# Patient Record
Sex: Male | Born: 1955 | Race: White | Hispanic: No | Smoking: Former smoker
Health system: Southern US, Community
[De-identification: ages and names within clinical notes are randomized; demographics above are authoritative.]

## PROBLEM LIST (undated history)

## (undated) DIAGNOSIS — J449 Chronic obstructive pulmonary disease, unspecified: Secondary | ICD-10-CM

## (undated) DIAGNOSIS — N189 Chronic kidney disease, unspecified: Secondary | ICD-10-CM

## (undated) DIAGNOSIS — I70221 Atherosclerosis of native arteries of extremities with rest pain, right leg: Secondary | ICD-10-CM

## (undated) DIAGNOSIS — E785 Hyperlipidemia, unspecified: Secondary | ICD-10-CM

## (undated) DIAGNOSIS — D649 Anemia, unspecified: Secondary | ICD-10-CM

## (undated) DIAGNOSIS — N186 End stage renal disease: Secondary | ICD-10-CM

## (undated) DIAGNOSIS — U071 COVID-19: Secondary | ICD-10-CM

## (undated) DIAGNOSIS — T4145XA Adverse effect of unspecified anesthetic, initial encounter: Secondary | ICD-10-CM

## (undated) DIAGNOSIS — I639 Cerebral infarction, unspecified: Secondary | ICD-10-CM

## (undated) DIAGNOSIS — K219 Gastro-esophageal reflux disease without esophagitis: Secondary | ICD-10-CM

## (undated) DIAGNOSIS — Q211 Atrial septal defect: Secondary | ICD-10-CM

## (undated) DIAGNOSIS — I253 Aneurysm of heart: Secondary | ICD-10-CM

## (undated) DIAGNOSIS — J45909 Unspecified asthma, uncomplicated: Secondary | ICD-10-CM

## (undated) DIAGNOSIS — J189 Pneumonia, unspecified organism: Secondary | ICD-10-CM

## (undated) DIAGNOSIS — Z992 Dependence on renal dialysis: Secondary | ICD-10-CM

## (undated) DIAGNOSIS — K922 Gastrointestinal hemorrhage, unspecified: Secondary | ICD-10-CM

## (undated) DIAGNOSIS — Q2112 Patent foramen ovale: Secondary | ICD-10-CM

## (undated) DIAGNOSIS — T8859XA Other complications of anesthesia, initial encounter: Secondary | ICD-10-CM

## (undated) DIAGNOSIS — I10 Essential (primary) hypertension: Secondary | ICD-10-CM

## (undated) DIAGNOSIS — I739 Peripheral vascular disease, unspecified: Secondary | ICD-10-CM

## (undated) DIAGNOSIS — I509 Heart failure, unspecified: Secondary | ICD-10-CM

## (undated) DIAGNOSIS — Z720 Tobacco use: Secondary | ICD-10-CM

## (undated) HISTORY — PX: FINGER SURGERY: SHX640

## (undated) HISTORY — PX: COLONOSCOPY: SHX174

---
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
## 2000-03-29 ENCOUNTER — Emergency Department (HOSPITAL_COMMUNITY): Admission: EM | Admit: 2000-03-29 | Discharge: 2000-03-29 | Payer: Self-pay | Admitting: Emergency Medicine

## 2000-08-20 ENCOUNTER — Ambulatory Visit (HOSPITAL_BASED_OUTPATIENT_CLINIC_OR_DEPARTMENT_OTHER): Admission: RE | Admit: 2000-08-20 | Discharge: 2000-08-20 | Payer: Self-pay | Admitting: Orthopedic Surgery

## 2000-08-20 ENCOUNTER — Encounter: Payer: Self-pay | Admitting: Emergency Medicine

## 2000-09-21 ENCOUNTER — Ambulatory Visit (HOSPITAL_BASED_OUTPATIENT_CLINIC_OR_DEPARTMENT_OTHER): Admission: RE | Admit: 2000-09-21 | Discharge: 2000-09-21 | Payer: Self-pay | Admitting: Orthopedic Surgery

## 2002-01-30 ENCOUNTER — Emergency Department (HOSPITAL_COMMUNITY): Admission: EM | Admit: 2002-01-30 | Discharge: 2002-01-30 | Payer: Self-pay | Admitting: Emergency Medicine

## 2003-07-16 ENCOUNTER — Emergency Department (HOSPITAL_COMMUNITY): Admission: EM | Admit: 2003-07-16 | Discharge: 2003-07-16 | Payer: Self-pay | Admitting: Emergency Medicine

## 2003-08-11 ENCOUNTER — Emergency Department (HOSPITAL_COMMUNITY): Admission: EM | Admit: 2003-08-11 | Discharge: 2003-08-11 | Payer: Self-pay | Admitting: Emergency Medicine

## 2003-08-14 ENCOUNTER — Emergency Department (HOSPITAL_COMMUNITY): Admission: EM | Admit: 2003-08-14 | Discharge: 2003-08-14 | Payer: Self-pay | Admitting: Emergency Medicine

## 2004-07-24 ENCOUNTER — Emergency Department (HOSPITAL_COMMUNITY): Admission: EM | Admit: 2004-07-24 | Discharge: 2004-07-24 | Payer: Self-pay | Admitting: Emergency Medicine

## 2004-08-22 ENCOUNTER — Encounter (INDEPENDENT_AMBULATORY_CARE_PROVIDER_SITE_OTHER): Payer: Self-pay | Admitting: *Deleted

## 2004-08-22 ENCOUNTER — Ambulatory Visit (HOSPITAL_COMMUNITY): Admission: RE | Admit: 2004-08-22 | Discharge: 2004-08-22 | Payer: Self-pay | Admitting: Gastroenterology

## 2008-05-08 ENCOUNTER — Observation Stay (HOSPITAL_COMMUNITY): Admission: EM | Admit: 2008-05-08 | Discharge: 2008-05-09 | Payer: Self-pay | Admitting: Emergency Medicine

## 2010-10-07 ENCOUNTER — Emergency Department (HOSPITAL_COMMUNITY)
Admission: EM | Admit: 2010-10-07 | Discharge: 2010-10-07 | Disposition: A | Discharge status: Home / Self Care | Payer: Self-pay | Attending: Emergency Medicine | Admitting: Emergency Medicine

## 2010-10-07 DIAGNOSIS — M79609 Pain in unspecified limb: Secondary | ICD-10-CM | POA: Insufficient documentation

## 2010-10-07 DIAGNOSIS — S6990XA Unspecified injury of unspecified wrist, hand and finger(s), initial encounter: Secondary | ICD-10-CM | POA: Insufficient documentation

## 2010-10-07 DIAGNOSIS — IMO0002 Reserved for concepts with insufficient information to code with codable children: Secondary | ICD-10-CM | POA: Insufficient documentation

## 2010-10-07 DIAGNOSIS — S61209A Unspecified open wound of unspecified finger without damage to nail, initial encounter: Secondary | ICD-10-CM | POA: Insufficient documentation

## 2010-10-07 DIAGNOSIS — W269XXA Contact with unspecified sharp object(s), initial encounter: Secondary | ICD-10-CM | POA: Insufficient documentation

## 2010-10-21 NOTE — H&P (Signed)
NAMEDONLD, LUEBBE                ACCOUNT NO.:  [id number]   MEDICAL RECORD NO.:  [id number]          PATIENT TYPE:  OBV   LOCATION:  5012                         FACILITY:  [facility]   PHYSICIAN:  Satira Anis. Gramig, M.D.DATE OF BIRTH:  15-Apr-1956   DATE OF ADMISSION:  05/08/2008  DATE OF DISCHARGE:                              HISTORY & PHYSICAL   CHIEF COMPLAINT:  Finger infected.   HISTORY OF PRESENT ILLNESS:  Mr. Minser is a very pleasant 55 year old  right-hand dominant male who presents to the [facility] Emergency Room  for evaluation of his right middle finger.  The patient states that he  sustained a lacerating-up injury last Thursday to the distal tip of the  middle finger.  He states it bled a lot; however, he sought no medical  therapy at that juncture and states he wrapped with medical tape.  Since that time, he has had increasing pain to the extent.  In the past  2 days, he states he has found a quite difficult to sleep.  He denies  any fevers or chills.  He states in the past 2 days his pain has got so  significant that he decided to come to the emergency room for  evaluation.  I should note he has not removed the tape since the time of  application last Wednesday.  His tetanus is updated in the emergency  room.  He denies any numbness, tingling, or other injury to the upper  extremity.  He has been n.p.o. since 5:00 to 6:00 a.m. this morning  having only coffee prior to that.   He has had a prior right middle finger graft procedure to the distal tip  with a donor from the volar forearm.  He states in 2002 at outside of  our office setting and states this seems to be the most affected portion  of his injury.   PAST MEDICAL HISTORY:  None per his description.   PAST SURGICAL HISTORY:  As previously mentioned.   CURRENT MEDICATIONS:  No known drug allergies.   SOCIAL HISTORY:  He is divorced.  He has 3 children.  He smokes 1-1/2  packs per day.  Occasionally enjoys a  beer.   FAMILY MEDICAL HISTORY:  Noncontributory.   Review of systems is negative.   PHYSICAL EXAMINATION:  GENERAL:  A pleasant but somewhat anxious  gentleman, appearing slightly older than his stated age.  VITAL SIGNS:  Temperature is 98.2, blood pressure is 176/101,  respirations 18, pulse 89.  HEENT:  Extraocular movements intact.  No gross abnormality is present.  EXTREMITIES:  Focused examination of the upper extremity shows on  evaluation of the right hand in particularly the middle finger, he has  notable fluctuance and necrosis with de-epithelialization and dehiscence  of the prior graft radially about the middle finger DIP.  He has a soft  tissue swelling, erythema, and slight ascending erythema; however, he  has no Kanavel signs.  He is nontender about the flexor sheath.  He is  nontender about the thenar, hypothenar, and mid palmar space.  FDS, FDP  are  intact.  His extensor mechanism is intact.  He has noted purulence  present about the wound.  Cultures were obtained prior to hand surgery  consultation.  He is exquisitely tender about the distal tip of the  digit.  His radiographs were reviewed and showed no acute fracture,  subluxation, probable old fracture deformity of the fifth metacarpal,  soft tissue irregularity, present metallic foreign body superficial in  the palmar aspect of soft tissue adjacent to the first metacarpal  approximately 4 mm.   Laboratory data shows that he has white blood cell count is 12.9, H and  H is 17.9 and 52.8, his platelet count 250.  CMET is within normal  limits.   ASSESSMENT:  1. Right middle finger laceration with development of significant      infective process with deep abscess, fluctuance, and signs of early      ascending cellulitis present.  2. Probable undiagnosed hypertension.  3. History of tobacco abuse.   PLAN:  We discussed with Mr. Sprunk the nature of his upper extremity  pigment as well as the need for urgent I  and D given the infected  process.  After discussing with the risks and benefits thoroughly in the  emergency room area after obtaining verbal consent, we proceeded with an  I and D.   PROCEDURE IN DETAIL:  After obtaining verbal consent, a digital block  was performed under sterile technique to the right middle finger with 10  mL of Xylocaine without epinephrine.  He tolerated this very well.  Once  appropriate anesthetic was obtained, the patient was then prepped and  draped in the usual sterile fashion.  Under 2.0 loupe magnification,  sharp debridement was performed to the soft tissues including the soft  tissues of the small finger, which included skin and subcutaneous  tissue.  I should note his prior graft site about the radial aspect of  the DIP had de-epithelialized and dehisced.  He had near-full  circumferential involvement of the distal tip and underwent a  significant soft tissue debridement including skin and portions of  subcutaneous tissue.  There was some encroachment on the palm the radial  aspect of the eponychial fold; however, there was no deep tracking  dorsally or volarly.  He was noted to have a significant necrotic and  infective tissue as well as deep abscess about the radial aspect of the  finger.  Once debridement was performed, a copious irrigation was  employed followed by sterile dressing application.   We discussed with Mr. Bautista given the degree of his infective process  our recommendations for admittance to the hospital for diligent wound  care and IV antibiotics.  We will await preop and intraoperative  cultures.  Empirically, he will be started on vancomycin and standard  postoperative pain medications.  We will consult therapy for daily  whirlpools, wet-to-dry dressing changes, and close observation.  We  discussed with him the possibility of needing a second I and D; however.  All questions were encouraged and answered.      Avelina Laine,  P.A.-C.      Satira Anis. Amedeo Plenty, M.D.  Electronically Signed    BB/MEDQ  D:  05/08/2008  T:  05/09/2008  Job:  [id number]   cc:   Shane Crutch. Gaynelle Arabian, M.D.

## 2010-10-24 NOTE — Op Note (Signed)
[place]  Patient:    Albert Harris, Albert Harris                       MRN: [id number] Proc. Date: 08/20/00 Adm. Date:  [id number] Attending:  Isaac Laud                           Operative Report  PREOPERATIVE DIAGNOSES: 1. 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 2. Dupuytrens contracture affecting pretendinous fibers to long and ring    fingers, left hand  POSTOPERATIVE DIAGNOSES: 1. 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 2. Dupuytrens contracture affecting pretendinous fibers to long and ring    fingers, left hand  PROCEDURES: 1. Release of right long finger A1 pulley. 2. Subtotal palmar fasciectomy involving pretendinous fibers to long and ring    fingers to prevent progression of Dupuytrens contracture following palmar    incision.  SURGEON:  Youlanda Mighty. Sypher, Brooke Bonito., M.D.  ASSISTANT:  Julian Reil, P.A.  ANESTHESIA:  0.25% Marcaine and 2% lidocaine metacarpal head level block of left long and ring fingers, supplemented by IV sedation, supervised by the anesthesiologist, Ala Dach, M.D.  INDICATIONS:  Albert Harris is a 55 year old right-hand dominant man who has had a history of stenosing tenosynovitis affecting his left long finger.  He had significant discomfort and active locking of his interphalangeal joints with finger flexion.  Due to a failure to respond to nonoperative measures, he is brought to the operating room at this time for release of his left long finger A1 pulley.  Due to an antecedent history of Dupuytrens contracture affecting the pretendinous fibers to the long and ring fingers, we will also excise the contracted fascia adjacent to his palmar incision for release of the A1 pulley to prevent an aggravation of his pre-existing Dupuytrens disease leading to flexion contracture of his MP and/or IP joints of the long and ring fingers.  DESCRIPTION  OF PROCEDURE:  Albert Harris was brought to the operating room and placed in the supine position on the operating table.  Following light sedation, the left arm was prepped with Betadine soap and solution and sterilely draped.  Following exsanguination of the limb with an Esmarch bandage, the arterial tourniquet was inflated to 220 mmHg.  Marcaine 0.25% and 2% lidocaine were infiltrated at the metacarpal head level to obtain digital blocks of the left long and ring fingers.  When anesthesia was satisfactory, the procedure commenced with a transverse incision extending to the distal palmar crease from the radial aspect of the long finger to the ulnar aspect of the ring finger flexor sheath. Subcutaneous tissues were carefully divided, taking care to meticulously dissect the pretendinous fibers of the palmar fascia.  This was subsequently excised, relieving his minor flexion contractor and the nodular deformity of the skin.  The A1 pulley of the long finger was isolated and split with scissors.  There was noted to be edematous flexor digitorum superficialis tendon.  Thereafter, full range of motion of the long finger was recovered.  Attention was then directed to the pretendinous fibers to the ring finger. These were carefully separated from the deep surface of the dermis, and a segment of the pretendinous fibers that was contracted with Dupuytrens disease was resected approximately 2 cm in length and 1 cm in width.  Care was taken to protect the neurovascular bundles throughout dissection.  The wound was then inspected for bleeding points, which were electrocauterized with bipolar current, followed by repair of the skin with mattress sutures of 5-0 nylon.  There were no apparent complications.  The wound was dressed with Xeroflo, sterile gauze, and an Ace wrap.  For aftercare, Mr. Albertina Parr was given a prescription for Tylenol With Codeine No. 3, one to two tablets p.o. q.4-6h. p.r.n.  pain, a total of 20 tablets without refill; also Keflex 500 mg one p.o. q.8h. x 4 days as prophylactic antibiotic.DD:  08/20/00 TD:  08/21/00 Job: [id number]

## 2010-10-24 NOTE — Op Note (Signed)
[place]  Patient:    AARIS, YACKEL                       MRN: [id number] Proc. Date: 09/21/00 Adm. Date:  [id number] Attending:  Isaac Laud                           Operative Report  PREOPERATIVE DIAGNOSIS:  Status post irrigation debridement and repair of a complex rip saw injury creating open fracture and severe soft tissue injury to right long finger with subsequent development of full-thickness eschar and radial flap.  PROCEDURE: 1. Debridement of eschar right long finger followed by extensive debridement    of hyperplastic epidermis. 2. Full-thickness graft measuring 3 x 1.5 cm harvested from medial distal    brachium defatted, thin to a partial thickness graft and applied to the    radial pulp of the right long finger.  SURGEON:  Youlanda Mighty. Sypher, Brooke Bonito., M.D.  ASSISTANT:  Julian Reil, P.A.  ANESTHESIA:  General by LMA.  SUPERVISING ANESTHESIOLOGIST:  Rayvon Char. Al Corpus, M.D.  INDICATIONS:  Tramayne Bourgeois is a 55 year old man who sustained a complex injury to his right long finger when he was injured with a rip saw on August 21, 1999. He was seen in the emergency room and transferred to the day surgery center for immediate debridement followed by repair of his complex wound to the long finger.  He is  2 pack a day smoker and was unable to diminish his smoking in the postoperative period.  HIs flap laceration appeared to be healing satisfactorily for 2 weeks, however, during the third postoperative week he began to show signs of dusky discoloration of his flap and by the fourth postoperative week had a dry eschar consistent with dry gangrene.  He appeared to have a jeopardized flap that ultimately became ischemic, perhaps the consequence of smoking, in addition to a severe injury to the subdermal plexus.  The flap did not have a favorable length to width ratio being approximately 3-1/2 cm in length and only  approximately 1.2 cm wide.  After he developed the eschar we recommended debridement and full-thickness grafting.  A flap was not considered given his history of heavy smoking and with a history of injury to the radial proper digital nerve a flap did not have significant advantages over a graft in my judgement.  PROCEDURE:  Ermine Cordy is brought to the operating room and placed supine on the operating room table.  Following induction of general anesthesia by LMA the right arm was prepped with Betadine soap solution and sterilely draped.  Following exsanguination of the limb with an Esmarch bandage the arterial tourniquet was inflated to 210 mmHg.  Procedure commenced with a tangential excision of the eschar.  This ultimately turned out to be a full thickness eschar.  This was debrided down to healthy appearing fat and punctate bleeding in the subdermal tissues.  This was then thoroughly lavaged with sterile saline and abraded with a clean sponge and sterile saline.  The hypertrophic epidermis on the fingertip was debrided with forceps, circumferentially.  Attention was then directed to the proximal brachium.  A full thickness graft measuring 3 x 1.5 cm was harvested.  This was thinned to a partial thickness and completely defatted with tenotomy scissors.   This was then applied to the defect on the radial aspect of the long finger  and inset with multiple interrupted mattress sutures of 5-0 nylon.  Satisfactory coverage was achieved.  The wound was then dressed with Xeroflo and the proximal donor site dressed with Steri-Strips, gauze and Ace wrap.  The finger was dressed with Coban. There were no apparent complications.  Mr. Baik tolerated the surgery and anesthesia well.  In the postoperative period he will be encouraged to diminish smoking and elevate his hand for approximately 1 week.  He will return for follow up in the office in approximately 5 days for wound check at the  donor site and a redressing of his finger.  NOTE:  For aftercare he is given prescriptions for: 1. Vicodin p.r.n. pain 2. Keflex 500 mg 1 p.o. q.8h. x 4 days as a prophylactic antibiotic. DD:  09/21/00 TD:  09/21/00 Job: [id number]

## 2010-10-24 NOTE — Op Note (Signed)
[place]. [facility]  Patient:    Albert Harris, Albert Harris                       MRN: [id number] Proc. Date: 08/20/00 Adm. Date:  [id number] Attending:  Isaac Laud                           Operative Report  PREOPERATIVE DIAGNOSIS:  Table saw laceration, right long finger radial aspect, splitting the nail, distal phalangeal segment, and middle phalangeal segment to the level of the proximal metaphysis of the middle phalanx, with probable partial laceration of radial proper digital artery and nerve, right long finger.  POSTOPERATIVE DIAGNOSIS:  Table saw laceration, right long finger radial aspect, splitting the nail, distal phalangeal segment, and middle phalangeal segment to the level of the proximal metaphysis of the middle phalanx, with probable partial laceration of radial proper digital artery and nerve, right long finger.  PROCEDURES: 1. Resection of remnants of radial aspect of right long fingernail, including    nail plate, dorsal, intermediate, and ventral nail fold. 2. Extensive debridement of untidy wound, creating a flap of the radial    aspect of the right long finger, with irrigation and debridement of open    fracture of distal phalanx and debridement of partial laceration of radial    collateral ligament of distal interphalangeal joint, right long finger.  SURGEON:  Youlanda Mighty. Sypher, Brooke Bonito., M.D.  ASSISTANT:  Julian Reil, P.A.  ANESTHESIA:  General by LMA, supervised by the anesthesiologist, Ala Dach, M.D.  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  He sustained a full-thickness excavation-type laceration through the radial aspect of his long finger longitudinally, extending through the nail plate, distal phalangeal segment, and middle phalangeal segment to the level of the  proximal metaphysis of the middle phalanx.  He avoided most of the bony structures, except he did amputate a portion of the radial condyle of the distal phalanx and partially disrupted his radial collateral ligament at the DIP joint.  His radial neurovascular bundle was partially excavated by the blade.  He had a viable skin flap on the radial aspect of his finger that had remnants of his radial nail fold.  He was seen at [facility] Emergency Room by Dr. Chauncey Fischer, who asked for a hand surgery consult.  His wounds were dressed sterilely, and he was transferred to the [facility] for urgent surgery.  Due to the fact that he had breakfast at 9 a.m., we had to wait until after 3 p.m. to perform his surgery.  After anesthesia clearance by Dr. Orene Desanctis, he is brought to the operating room at this time.  DESCRIPTION OF PROCEDURE:  Kaine Gillyard was brought to the operating room and placed in the supine position on the operating table.  Following induction of general anesthesia by LMA, the right arm was prepped with Betadine soap and solution and sterilely draped.  Following exsanguination of the limb with an Esmarch bandage, the tourniquet was placed at 220 mmHg.  The procedure commenced with meticulous debridement of his wounds, removing all devitalized tissue, foreign material, and electrocauterizing vein and arteriolar bleeding sites.  The radial nail fold, including the plate, dorsal, intermediate, and ventral fold, were excised sharply to prevent nail  horn formation.  The remaining portion of the nail in the central and ulnar aspect of the finger was tidied and prepared for wound closure.  An extensive debridement of the DIP joint and radial collateral ligament remnants was completed, followed by a partial debridement of the flexor digitorum profundus tendon.  The A4, C2, A3, and A2 pulleys were all intact.  The wound was thoroughly lavaged with sterile saline, followed by  triple antibiotic solution.  The wound was then repaired with mattress sutures of 5-0 nylon, reconstructing the middle phalangeal segment and pulp, and 4-0 nylon on the dorsum.  The tourniquet was released, and capillary refill was noted immediately in the flap as well as the finger proper.  The finger was dressed with Xeroflo, sterile gauze, and Coban.  Mr. Troutman is advised to elevate his hand above his heart for the next 48 hours.  He will work on active range of motion exercises of his uninjured fingers.  He was noted to have minimal lacerations to the index, ring, and small fingers, which were cleaned and will be dressed with Band-Aids.  He will return to our office for follow-up in approximately five days to begin an exercise program and therapy.  He will need to protect his finger for approximately six weeks while the DIP collateral ligaments heal as well as his neurovascular status stabilizes. Of note, for aftercare he is given prescriptions for Percocet 5 mg one or two tablets p.o. q4-6h. p.r.n. pain, 20 tablets without refill; also Keflex 500 mg one p.o. q.8h. x 4 days as prophylactic antibiotic. DD:  08/20/00 TD:  08/21/00 Job: [id number]

## 2011-03-12 LAB — BASIC METABOLIC PANEL
BUN: 9 mg/dL (ref 6–23)
CO2: 25 mEq/L (ref 19–32)
Calcium: 9.7 mg/dL (ref 8.4–10.5)
Chloride: 102 mEq/L (ref 96–112)
Creatinine, Ser: 1.02 mg/dL (ref 0.4–1.5)
GFR calc Af Amer: >60 mL/min (ref >60)
GFR calc non Af Amer: >60 mL/min (ref >60)
Glucose, Bld: 110 mg/dL — ABNORMAL HIGH (ref 70–99)
Potassium: 4.5 mEq/L (ref 3.5–5.1)
Sodium: 136 mEq/L (ref 135–145)

## 2011-03-12 LAB — TISSUE CULTURE

## 2011-03-12 LAB — CBC
HCT: 52.8 % — ABNORMAL HIGH (ref 39.0–52.0)
Hemoglobin: 17.9 g/dL — ABNORMAL HIGH (ref 13.0–17.0)
MCHC: 34 g/dL (ref 30.0–36.0)
RBC: 5.39 MIL/uL (ref 4.22–5.81)
RDW: 13.5 % (ref 11.5–15.5)

## 2011-03-12 LAB — WOUND CULTURE

## 2011-03-12 LAB — DIFFERENTIAL
Basophils Absolute: 0.1 10*3/uL (ref 0.0–0.1)
Eosinophils Relative: 1 % (ref 0–5)
Lymphocytes Relative: 15 % (ref 12–46)
Monocytes Absolute: 0.5 10*3/uL (ref 0.1–1.0)
Monocytes Relative: 4 % (ref 3–12)
Neutro Abs: 10.2 10*3/uL — ABNORMAL HIGH (ref 1.7–7.7)

## 2011-03-12 LAB — ANAEROBIC CULTURE: Gram Stain: NONE SEEN

## 2013-02-06 ENCOUNTER — Encounter (HOSPITAL_BASED_OUTPATIENT_CLINIC_OR_DEPARTMENT_OTHER): Payer: Self-pay

## 2013-02-06 ENCOUNTER — Emergency Department (HOSPITAL_BASED_OUTPATIENT_CLINIC_OR_DEPARTMENT_OTHER)
Admission: EM | Admit: 2013-02-06 | Discharge: 2013-02-06 | Disposition: A | Discharge status: Home / Self Care | Payer: Self-pay | Attending: Emergency Medicine | Admitting: Emergency Medicine

## 2013-02-06 DIAGNOSIS — R599 Enlarged lymph nodes, unspecified: Secondary | ICD-10-CM | POA: Insufficient documentation

## 2013-02-06 DIAGNOSIS — I10 Essential (primary) hypertension: Secondary | ICD-10-CM | POA: Insufficient documentation

## 2013-02-06 DIAGNOSIS — H9209 Otalgia, unspecified ear: Secondary | ICD-10-CM | POA: Insufficient documentation

## 2013-02-06 DIAGNOSIS — F17200 Nicotine dependence, unspecified, uncomplicated: Secondary | ICD-10-CM | POA: Insufficient documentation

## 2013-02-06 DIAGNOSIS — R591 Generalized enlarged lymph nodes: Secondary | ICD-10-CM

## 2013-02-06 DIAGNOSIS — J45909 Unspecified asthma, uncomplicated: Secondary | ICD-10-CM | POA: Insufficient documentation

## 2013-02-06 HISTORY — DX: Essential (primary) hypertension: I10

## 2013-02-06 HISTORY — DX: Unspecified asthma, uncomplicated: J45.909

## 2013-02-06 MED ORDER — DOXYCYCLINE HYCLATE 100 MG PO CAPS: 100.0000 mg | ORAL_CAPSULE | Freq: Two times a day (BID) | ORAL | Status: DC

## 2013-02-06 MED ORDER — HYDROCODONE-ACETAMINOPHEN 5-325 MG PO TABS: 2.0000 | ORAL_TABLET | ORAL | Status: DC | PRN

## 2013-02-06 NOTE — ED Provider Notes (Signed)
CSN: [id number]     Arrival date & time 02/06/13  [id number] History   First MD Initiated Contact with Patient 02/06/13 [phone]     Chief Complaint  Patient presents with  . Ear Problem  . Neck Pain   (Consider location/radiation/quality/duration/timing/severity/associated sxs/prior Treatment) HPI Comments: Pt states that he thinks he hit his left ear on a windowsill about 1 weeks ago:pt states that within the last week he had a red swollen and hot ear that has gone down:pt states that he is still having tenderness and swelling by the ear and near the neck and there area bump near his neck  The history is provided by the patient. No language interpreter was used.    Past Medical History  Diagnosis Date  . Hypertension   . Asthma    Past Surgical History  Procedure Laterality Date  . Finger surgery     No family history on file. History  Substance Use Topics  . Smoking status: Current Every Day Smoker -- 1.00 packs/day    Types: Cigarettes  . Smokeless tobacco: Not on file  . Alcohol Use: 3.6 oz/week    6 Cans of beer per week     Comment: daily    Review of Systems  Constitutional: Negative.   Respiratory: Negative.   Cardiovascular: Negative.     Allergies  Review of patient's allergies indicates no known allergies.  Home Medications  No current outpatient prescriptions on file. BP 157/102  Pulse 88  Temp(Src) 98.5 F (36.9 C) (Oral)  Resp 16  Ht 5\' 8"  (1.727 m)  Wt 140 lb (63.504 kg)  BMI 21.29 kg/m2  SpO2 100% Physical Exam  Nursing note and vitals reviewed. Constitutional: He is oriented to person, place, and time. He appears well-developed and well-nourished.  HENT:  Head: Normocephalic and atraumatic.  Right Ear: External ear normal.  Left Ear: External ear normal.  No mastoid tenderness:no swelling redness or warmth to the left ear:no rash noted  Eyes: Conjunctivae and EOM are normal. Pupils are equal, round, and reactive to light.  Neck:  Pt has lymph  change is swollen on the right side  Cardiovascular: Normal rate and regular rhythm.   Pulmonary/Chest: Effort normal.  Musculoskeletal: Normal range of motion.  Neurological: He is alert and oriented to person, place, and time.  Skin: Skin is warm and dry.    ED Course  Procedures (including critical care time) Labs Review Labs Reviewed - No data to display Imaging Review No results found.  MDM   1. Lymphadenopathy   will treat with antibiotics;no definite infection noted:discussed possibility of early shingles pain:pt given follow up instructions if nodes don't go down with antiobiotics    Glendell Docker, NP 02/06/13 1007

## 2013-02-06 NOTE — ED Notes (Signed)
Pt reports striking left ear on a windowsill 1 week ago.  He has swelling and pain on ear.  He aslo reports pain in left side of neck radiating around top of ear.

## 2013-02-06 NOTE — ED Provider Notes (Signed)
Medical screening examination/treatment/procedure(s) were performed by non-physician practitioner and as supervising physician I was immediately available for consultation/collaboration.   Neta Ehlers, MD 02/06/13 2028

## 2014-03-19 ENCOUNTER — Encounter (HOSPITAL_COMMUNITY): Payer: Self-pay | Admitting: Emergency Medicine

## 2014-03-19 ENCOUNTER — Emergency Department (HOSPITAL_COMMUNITY)
Admission: EM | Admit: 2014-03-19 | Discharge: 2014-03-19 | Disposition: A | Discharge status: Home / Self Care | Payer: Self-pay | Attending: Emergency Medicine | Admitting: Emergency Medicine

## 2014-03-19 ENCOUNTER — Emergency Department (HOSPITAL_COMMUNITY): Payer: Self-pay

## 2014-03-19 DIAGNOSIS — R42 Dizziness and giddiness: Secondary | ICD-10-CM | POA: Insufficient documentation

## 2014-03-19 DIAGNOSIS — J189 Pneumonia, unspecified organism: Secondary | ICD-10-CM

## 2014-03-19 DIAGNOSIS — J45909 Unspecified asthma, uncomplicated: Secondary | ICD-10-CM | POA: Insufficient documentation

## 2014-03-19 DIAGNOSIS — N179 Acute kidney failure, unspecified: Secondary | ICD-10-CM | POA: Insufficient documentation

## 2014-03-19 DIAGNOSIS — R509 Fever, unspecified: Secondary | ICD-10-CM

## 2014-03-19 DIAGNOSIS — R1110 Vomiting, unspecified: Secondary | ICD-10-CM | POA: Insufficient documentation

## 2014-03-19 DIAGNOSIS — Z720 Tobacco use: Secondary | ICD-10-CM | POA: Insufficient documentation

## 2014-03-19 DIAGNOSIS — I10 Essential (primary) hypertension: Secondary | ICD-10-CM | POA: Insufficient documentation

## 2014-03-19 DIAGNOSIS — E860 Dehydration: Secondary | ICD-10-CM | POA: Insufficient documentation

## 2014-03-19 DIAGNOSIS — J159 Unspecified bacterial pneumonia: Secondary | ICD-10-CM | POA: Insufficient documentation

## 2014-03-19 LAB — COMPREHENSIVE METABOLIC PANEL
ALK PHOS: 73 U/L (ref 39–117)
ALT: 12 U/L (ref 0–53)
AST: 18 U/L (ref 0–37)
Albumin: 2.3 g/dL — ABNORMAL LOW (ref 3.5–5.2)
Anion gap: 18 — ABNORMAL HIGH (ref 5–15)
BUN: 29 mg/dL — ABNORMAL HIGH (ref 6–23)
CO2: 21 meq/L (ref 19–32)
Calcium: 8.6 mg/dL (ref 8.4–10.5)
Chloride: 94 mEq/L — ABNORMAL LOW (ref 96–112)
Creatinine, Ser: 2.02 mg/dL — ABNORMAL HIGH (ref 0.50–1.35)
GFR, EST AFRICAN AMERICAN: 40 mL/min — AB (ref >90)
GFR, EST NON AFRICAN AMERICAN: 34 mL/min — AB (ref >90)
GLUCOSE: 118 mg/dL — AB (ref 70–99)
POTASSIUM: 3.7 meq/L (ref 3.7–5.3)
SODIUM: 133 meq/L — AB (ref 137–147)
TOTAL PROTEIN: 6.8 g/dL (ref 6.0–8.3)
Total Bilirubin: 0.4 mg/dL (ref 0.3–1.2)

## 2014-03-19 LAB — CBC WITH DIFFERENTIAL/PLATELET
Basophils Absolute: 0 10*3/uL (ref 0.0–0.1)
Basophils Relative: 0 % (ref 0–1)
Eosinophils Absolute: 0 10*3/uL (ref 0.0–0.7)
Eosinophils Relative: 0 % (ref 0–5)
HCT: 42 % (ref 39.0–52.0)
Hemoglobin: 15 g/dL (ref 13.0–17.0)
LYMPHS ABS: 0.6 10*3/uL — AB (ref 0.7–4.0)
LYMPHS PCT: 5 % — AB (ref 12–46)
MCH: 33.2 pg (ref 26.0–34.0)
MCHC: 35.7 g/dL (ref 30.0–36.0)
MCV: 92.9 fL (ref 78.0–100.0)
Monocytes Absolute: 0.4 10*3/uL (ref 0.1–1.0)
Monocytes Relative: 3 % (ref 3–12)
NEUTROS PCT: 92 % — AB (ref 43–77)
Neutro Abs: 11.1 10*3/uL — ABNORMAL HIGH (ref 1.7–7.7)
PLATELETS: 143 10*3/uL — AB (ref 150–400)
RBC: 4.52 MIL/uL (ref 4.22–5.81)
RDW: 13 % (ref 11.5–15.5)
WBC: 12.1 10*3/uL — AB (ref 4.0–10.5)

## 2014-03-19 LAB — URINALYSIS, ROUTINE W REFLEX MICROSCOPIC
GLUCOSE, UA: NEGATIVE mg/dL
Ketones, ur: 15 mg/dL — AB
Nitrite: NEGATIVE
Protein, ur: >300 mg/dL — AB
SPECIFIC GRAVITY, URINE: 1.03 (ref 1.005–1.030)
UROBILINOGEN UA: 1 mg/dL (ref 0.0–1.0)
pH: 5 (ref 5.0–8.0)

## 2014-03-19 LAB — URINE MICROSCOPIC-ADD ON

## 2014-03-19 LAB — I-STAT CG4 LACTIC ACID, ED: Lactic Acid, Venous: 1.23 mmol/L (ref 0.5–2.2)

## 2014-03-19 MED ORDER — LEVOFLOXACIN IN D5W 750 MG/150ML IV SOLN
750.0000 mg | Freq: Once | INTRAVENOUS | Status: AC
  Administered 2014-03-19: 750 mg via INTRAVENOUS
  Filled 2014-03-19: qty 150

## 2014-03-19 MED ORDER — ONDANSETRON HCL 4 MG PO TABS: 4.0000 mg | ORAL_TABLET | Freq: Four times a day (QID) | ORAL | Status: DC

## 2014-03-19 MED ORDER — LEVOFLOXACIN 750 MG PO TABS: 750.0000 mg | ORAL_TABLET | Freq: Every day | ORAL | Status: DC

## 2014-03-19 MED ORDER — ACETAMINOPHEN 500 MG PO TABS
1000.0000 mg | ORAL_TABLET | Freq: Once | ORAL | Status: AC
  Administered 2014-03-19: 1000 mg via ORAL
  Filled 2014-03-19: qty 2

## 2014-03-19 MED ORDER — SODIUM CHLORIDE 0.9 % IV BOLUS (SEPSIS)
1000.0000 mL | Freq: Once | INTRAVENOUS | Status: AC
  Administered 2014-03-19: 1000 mL via INTRAVENOUS

## 2014-03-19 MED ORDER — MORPHINE SULFATE 4 MG/ML IJ SOLN: 4.0000 mg | Freq: Once | INTRAMUSCULAR | Status: DC

## 2014-03-19 NOTE — ED Notes (Signed)
Pt c/o fever, headache N/V since Friday. Pt has tried ibuprofen at 0700 today. Pt has had decreased appetite.

## 2014-03-19 NOTE — Discharge Planning (Signed)
[facility] was unable to see patient, [facility] orange card information will be sent to the address listed.

## 2014-03-19 NOTE — Discharge Instructions (Signed)
If you were given medicines take as directed.  If you are on coumadin or contraceptives realize their levels and effectiveness is altered by many different medicines.  If you have any reaction (rash, tongues swelling, other) to the medicines stop taking and see a physician.   Please follow up as directed and return to the ER or see a physician for new or worsening symptoms.  Thank you. Filed Vitals:   03/19/14 1051 03/19/14 1119 03/19/14 1214 03/19/14 1303  BP:  136/80 126/74 115/72  Pulse:  95 88   Temp: 98.4 F (36.9 C)     TempSrc: Oral     Resp:  19 33 21  Height:      Weight:      SpO2:  99% 97% 98%

## 2014-03-19 NOTE — ED Provider Notes (Signed)
CSN: [id number]     Arrival date & time 03/19/14  0827 History   First MD Initiated Contact with Patient 03/19/14 [phone]     Chief Complaint  Patient presents with  . Fever  . Headache  . Emesis     (Consider location/radiation/quality/duration/timing/severity/associated sxs/prior Treatment) HPI Comments: 58 year old male with asthma, smoking and mild alcohol use presents with fever, productive cough and bodyaches worsening since Thursday. No sick contacts or recent travel, no current antibiotics. Patient generally just feels unwell. No significant infection history.  Patient is a 58 y.o. male presenting with fever, headaches, and vomiting. The history is provided by the patient.  Fever Associated symptoms: chills, cough, headaches and vomiting   Associated symptoms: no chest pain, no congestion, no diarrhea, no dysuria and no rash   Headache Associated symptoms: cough, fever and vomiting   Associated symptoms: no abdominal pain, no back pain, no congestion, no diarrhea, no neck pain and no neck stiffness   Emesis Associated symptoms: arthralgias, chills and headaches   Associated symptoms: no abdominal pain and no diarrhea     Past Medical History  Diagnosis Date  . Hypertension   . Asthma    Past Surgical History  Procedure Laterality Date  . Finger surgery     No family history on file. History  Substance Use Topics  . Smoking status: Current Every Day Smoker -- 1.00 packs/day    Types: Cigarettes  . Smokeless tobacco: Not on file  . Alcohol Use: 3.6 oz/week    6 Cans of beer per week     Comment: daily    Review of Systems  Constitutional: Positive for fever, chills and appetite change.  HENT: Negative for congestion.   Eyes: Negative for visual disturbance.  Respiratory: Positive for cough and shortness of breath.   Cardiovascular: Negative for chest pain.  Gastrointestinal: Positive for vomiting. Negative for abdominal pain and diarrhea.  Genitourinary:  Negative for dysuria and flank pain.  Musculoskeletal: Positive for arthralgias. Negative for back pain, neck pain and neck stiffness.  Skin: Negative for rash.  Neurological: Positive for light-headedness and headaches.      Allergies  Review of patient's allergies indicates no known allergies.  Home Medications   Prior to Admission medications   Medication Sig Start Date End Date Taking? Authorizing Provider  ibuprofen (ADVIL,MOTRIN) 200 MG tablet Take 600 mg by mouth every 4 (four) hours as needed for fever.   Yes Historical Provider, MD  levofloxacin (LEVAQUIN) 750 MG tablet Take 1 tablet (750 mg total) by mouth daily. 03/20/14   Mariea Clonts, MD  ondansetron (ZOFRAN) 4 MG tablet Take 1 tablet (4 mg total) by mouth every 6 (six) hours. 03/19/14   Mariea Clonts, MD   BP 115/72  Pulse 88  Temp(Src) 98.4 F (36.9 C) (Oral)  Resp 21  Ht 5\' 8"  (1.727 m)  Wt 145 lb (65.772 kg)  BMI 22.05 kg/m2  SpO2 98% Physical Exam  Nursing note and vitals reviewed. Constitutional: He is oriented to person, place, and time. He appears well-developed and well-nourished.  HENT:  Head: Normocephalic and atraumatic.  Mild dry mucous membranes  Eyes: Conjunctivae are normal. Right eye exhibits no discharge. Left eye exhibits no discharge.  Neck: Normal range of motion. Neck supple. No tracheal deviation present.  Cardiovascular: Normal rate and regular rhythm.   Pulmonary/Chest: Effort normal. He has rales (right lower lung field).  Abdominal: Soft. He exhibits no distension. There is no tenderness. There is no guarding.  Musculoskeletal: He exhibits no edema.  Neurological: He is alert and oriented to person, place, and time.  Skin: Skin is warm. No rash noted.  Psychiatric: He has a normal mood and affect.    ED Course  Procedures (including critical care time) Labs Review Labs Reviewed  CBC WITH DIFFERENTIAL - Abnormal; Notable for the following:    WBC 12.1 (*)    Platelets 143  (*)    Neutrophils Relative % 92 (*)    Neutro Abs 11.1 (*)    Lymphocytes Relative 5 (*)    Lymphs Abs 0.6 (*)    All other components within normal limits  COMPREHENSIVE METABOLIC PANEL - Abnormal; Notable for the following:    Sodium 133 (*)    Chloride 94 (*)    Glucose, Bld 118 (*)    BUN 29 (*)    Creatinine, Ser 2.02 (*)    Albumin 2.3 (*)    GFR calc non Af Amer 34 (*)    GFR calc Af Amer 40 (*)    Anion gap 18 (*)    All other components within normal limits  URINALYSIS, ROUTINE W REFLEX MICROSCOPIC - Abnormal; Notable for the following:    Color, Urine AMBER (*)    APPearance CLOUDY (*)    Hgb urine dipstick LARGE (*)    Bilirubin Urine SMALL (*)    Ketones, ur 15 (*)    Protein, ur >300 (*)    Leukocytes, UA TRACE (*)    All other components within normal limits  URINE MICROSCOPIC-ADD ON - Abnormal; Notable for the following:    Squamous Epithelial / LPF FEW (*)    Bacteria, UA FEW (*)    Casts GRANULAR CAST (*)    All other components within normal limits  URINE CULTURE  I-STAT CG4 LACTIC ACID, ED    Imaging Review Dg Chest 2 View  03/19/2014   CLINICAL DATA:  58 year old male with acute cough and fever for 4 days. Initial encounter.  EXAM: CHEST  2 VIEW  COMPARISON:  Frontal view of the chest in conjunction with abdominal series 216 2006.  FINDINGS: Large area of posterior right lower lobe confluent pulmonary opacity is new. Stable lung volumes. No associated pneumothorax, pleural effusion or pulmonary edema. Mediastinal contours appear stable and within normal limits. Visualized tracheal air column is within normal limits. No acute osseous abnormality identified.  IMPRESSION: Right lower lobe pneumonia. Post treatment radiographs recommended to document resolution.   Electronically Signed   By: Lars Pinks M.D.   On: 03/19/2014 08:57     EKG Interpretation None      MDM   Final diagnoses:  CAP (community acquired pneumonia)  Dehydration  Acute renal  failure, unspecified acute renal failure type   Overall well-appearing male with clinically pneumonia. Clinically dehydrated IV fluid bolus given. Lactate normal, white blood cell count fairly elevated. Levaquin IV ordered for [facility] pneumonia. Chest x-ray reviewed myself which confirms clinical right lower pneumonia. Repeat IV fluids given for worsening creatinine. Plan for close outpatient followup Patient improved significantly and reassessment. 2 L IV fluids given for dehydration acute renal failure which is likely prerenal. Patient comfortable close outpatient followup as he is not requiring oxygen no respiratory distress. IV antibiotics given in ER. Levaquin oral for home.  Results and differential diagnosis were discussed with the patient/parent/guardian. Close follow up outpatient was discussed, comfortable with the plan.   Medications  morphine 4 MG/ML injection 4 mg (4 mg Intravenous Not Given 03/19/14  1211)  sodium chloride 0.9 % bolus 1,000 mL (0 mLs Intravenous Stopped 03/19/14 1154)  levofloxacin (LEVAQUIN) IVPB 750 mg (0 mg Intravenous Stopped 03/19/14 1154)  acetaminophen (TYLENOL) tablet 1,000 mg (1,000 mg Oral Given 03/19/14 1119)  sodium chloride 0.9 % bolus 1,000 mL (1,000 mLs Intravenous New Bag/Given 03/19/14 1214)    Filed Vitals:   03/19/14 1051 03/19/14 1119 03/19/14 1214 03/19/14 1303  BP:  136/80 126/74 115/72  Pulse:  95 88   Temp: 98.4 F (36.9 C)     TempSrc: Oral     Resp:  19 33 21  Height:      Weight:      SpO2:  99% 97% 98%    Final diagnoses:  CAP (community acquired pneumonia)  Dehydration  Acute renal failure, unspecified acute renal failure type       Mariea Clonts, MD 03/19/14 1315

## 2014-03-20 LAB — URINE CULTURE
COLONY COUNT: NO GROWTH
Culture: NO GROWTH

## 2016-12-02 ENCOUNTER — Emergency Department (HOSPITAL_COMMUNITY)
Admission: EM | Admit: 2016-12-02 | Discharge: 2016-12-03 | Disposition: A | Discharge status: Home / Self Care | Payer: Self-pay | Attending: Emergency Medicine | Admitting: Emergency Medicine

## 2016-12-02 DIAGNOSIS — S199XXA Unspecified injury of neck, initial encounter: Secondary | ICD-10-CM | POA: Insufficient documentation

## 2016-12-02 DIAGNOSIS — J45909 Unspecified asthma, uncomplicated: Secondary | ICD-10-CM | POA: Insufficient documentation

## 2016-12-02 DIAGNOSIS — R0789 Other chest pain: Secondary | ICD-10-CM

## 2016-12-02 DIAGNOSIS — Y999 Unspecified external cause status: Secondary | ICD-10-CM | POA: Insufficient documentation

## 2016-12-02 DIAGNOSIS — F17210 Nicotine dependence, cigarettes, uncomplicated: Secondary | ICD-10-CM | POA: Insufficient documentation

## 2016-12-02 DIAGNOSIS — S299XXA Unspecified injury of thorax, initial encounter: Secondary | ICD-10-CM | POA: Insufficient documentation

## 2016-12-02 DIAGNOSIS — Y9389 Activity, other specified: Secondary | ICD-10-CM | POA: Insufficient documentation

## 2016-12-02 DIAGNOSIS — Y92410 Unspecified street and highway as the place of occurrence of the external cause: Secondary | ICD-10-CM | POA: Insufficient documentation

## 2016-12-02 DIAGNOSIS — R109 Unspecified abdominal pain: Secondary | ICD-10-CM

## 2016-12-02 DIAGNOSIS — I10 Essential (primary) hypertension: Secondary | ICD-10-CM | POA: Insufficient documentation

## 2016-12-02 DIAGNOSIS — R51 Headache: Secondary | ICD-10-CM | POA: Insufficient documentation

## 2016-12-02 DIAGNOSIS — S3991XA Unspecified injury of abdomen, initial encounter: Secondary | ICD-10-CM | POA: Insufficient documentation

## 2016-12-02 DIAGNOSIS — Z79899 Other long term (current) drug therapy: Secondary | ICD-10-CM | POA: Insufficient documentation

## 2016-12-03 ENCOUNTER — Emergency Department (HOSPITAL_COMMUNITY): Payer: Self-pay

## 2016-12-03 ENCOUNTER — Encounter (HOSPITAL_COMMUNITY): Payer: Self-pay | Admitting: Emergency Medicine

## 2016-12-03 LAB — I-STAT TROPONIN, ED
Troponin i, poc: 0.02 ng/mL (ref 0.00–0.08)
Troponin i, poc: 0.02 ng/mL (ref 0.00–0.08)

## 2016-12-03 LAB — URINALYSIS, ROUTINE W REFLEX MICROSCOPIC
Bilirubin Urine: NEGATIVE
GLUCOSE, UA: NEGATIVE mg/dL
KETONES UR: NEGATIVE mg/dL
Leukocytes, UA: NEGATIVE
Nitrite: NEGATIVE
PH: 6 (ref 5.0–8.0)
Protein, ur: >=300 mg/dL — AB
SPECIFIC GRAVITY, URINE: 1.016 (ref 1.005–1.030)
SQUAMOUS EPITHELIAL / LPF: NONE SEEN

## 2016-12-03 MED ORDER — NAPROXEN 250 MG PO TABS: ORAL_TABLET | ORAL | 0 refills | Status: DC

## 2016-12-03 MED ORDER — CYCLOBENZAPRINE HCL 10 MG PO TABS
10.0000 mg | ORAL_TABLET | Freq: Once | ORAL | Status: AC
  Administered 2016-12-03: 10 mg via ORAL
  Filled 2016-12-03: qty 1

## 2016-12-03 MED ORDER — CYCLOBENZAPRINE HCL 5 MG PO TABS: 5.0000 mg | ORAL_TABLET | Freq: Three times a day (TID) | ORAL | 0 refills | Status: DC | PRN

## 2016-12-03 MED ORDER — NAPROXEN 250 MG PO TABS
250.0000 mg | ORAL_TABLET | Freq: Once | ORAL | Status: AC
  Administered 2016-12-03: 250 mg via ORAL
  Filled 2016-12-03: qty 1
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
## 2016-12-03 NOTE — ED Notes (Signed)
ED Provider at bedside. 

## 2016-12-03 NOTE — Discharge Instructions (Signed)
Ice packs to the injured or sore muscles for the next several days then start using heat. Take the medications for pain and muscle spasms. Return to the ED for any problems listed on the head injury sheet. Recheck if you aren't improving in the next week.
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
## 2016-12-24 ENCOUNTER — Encounter (HOSPITAL_COMMUNITY): Payer: Self-pay | Admitting: Family Medicine

## 2016-12-24 ENCOUNTER — Ambulatory Visit (INDEPENDENT_AMBULATORY_CARE_PROVIDER_SITE_OTHER): Payer: 59 | Admitting: Physician Assistant

## 2016-12-24 ENCOUNTER — Encounter: Payer: Self-pay | Admitting: Physician Assistant

## 2016-12-24 ENCOUNTER — Emergency Department (HOSPITAL_BASED_OUTPATIENT_CLINIC_OR_DEPARTMENT_OTHER): Payer: 59

## 2016-12-24 ENCOUNTER — Observation Stay (HOSPITAL_BASED_OUTPATIENT_CLINIC_OR_DEPARTMENT_OTHER)
Admission: EM | Admit: 2016-12-24 | Discharge: 2016-12-25 | Disposition: A | Discharge status: Home / Self Care | Payer: 59 | Attending: Internal Medicine | Admitting: Internal Medicine

## 2016-12-24 ENCOUNTER — Emergency Department (HOSPITAL_COMMUNITY): Payer: 59

## 2016-12-24 VITALS — BP 200/120 | HR 89 | Temp 99.1°F | Ht 66.5 in | Wt 155.0 lb

## 2016-12-24 DIAGNOSIS — Z720 Tobacco use: Secondary | ICD-10-CM | POA: Diagnosis not present

## 2016-12-24 DIAGNOSIS — I160 Hypertensive urgency: Secondary | ICD-10-CM

## 2016-12-24 DIAGNOSIS — I639 Cerebral infarction, unspecified: Secondary | ICD-10-CM | POA: Diagnosis present

## 2016-12-24 DIAGNOSIS — R51 Headache: Secondary | ICD-10-CM | POA: Diagnosis not present

## 2016-12-24 DIAGNOSIS — M25551 Pain in right hip: Secondary | ICD-10-CM | POA: Diagnosis not present

## 2016-12-24 DIAGNOSIS — I10 Essential (primary) hypertension: Secondary | ICD-10-CM | POA: Diagnosis not present

## 2016-12-24 DIAGNOSIS — E785 Hyperlipidemia, unspecified: Secondary | ICD-10-CM | POA: Insufficient documentation

## 2016-12-24 DIAGNOSIS — Z7289 Other problems related to lifestyle: Secondary | ICD-10-CM | POA: Diagnosis not present

## 2016-12-24 DIAGNOSIS — F17210 Nicotine dependence, cigarettes, uncomplicated: Secondary | ICD-10-CM | POA: Diagnosis not present

## 2016-12-24 DIAGNOSIS — J45909 Unspecified asthma, uncomplicated: Secondary | ICD-10-CM | POA: Insufficient documentation

## 2016-12-24 DIAGNOSIS — R519 Headache, unspecified: Secondary | ICD-10-CM

## 2016-12-24 DIAGNOSIS — G459 Transient cerebral ischemic attack, unspecified: Secondary | ICD-10-CM | POA: Diagnosis not present

## 2016-12-24 DIAGNOSIS — Z7982 Long term (current) use of aspirin: Secondary | ICD-10-CM | POA: Diagnosis not present

## 2016-12-24 DIAGNOSIS — M546 Pain in thoracic spine: Secondary | ICD-10-CM

## 2016-12-24 DIAGNOSIS — M542 Cervicalgia: Secondary | ICD-10-CM

## 2016-12-24 HISTORY — DX: Hypertensive urgency: I16.0

## 2016-12-24 LAB — DIFFERENTIAL
BASOS ABS: 0.1 10*3/uL (ref 0.0–0.1)
Basophils Relative: 0 %
EOS ABS: 0.4 10*3/uL (ref 0.0–0.7)
Eosinophils Relative: 3 %
LYMPHS ABS: 2.7 10*3/uL (ref 0.7–4.0)
Lymphocytes Relative: 24 %
Monocytes Absolute: 0.7 10*3/uL (ref 0.1–1.0)
Monocytes Relative: 6 %
NEUTROS PCT: 67 %
Neutro Abs: 7.6 10*3/uL (ref 1.7–7.7)

## 2016-12-24 LAB — URINALYSIS, ROUTINE W REFLEX MICROSCOPIC
BILIRUBIN URINE: NEGATIVE
GLUCOSE, UA: NEGATIVE mg/dL
Hgb urine dipstick: NEGATIVE
KETONES UR: NEGATIVE mg/dL
LEUKOCYTES UA: NEGATIVE
NITRITE: NEGATIVE
PH: 7 (ref 5.0–8.0)
Protein, ur: 100 mg/dL — AB
SPECIFIC GRAVITY, URINE: 1.016 (ref 1.005–1.030)
Squamous Epithelial / LPF: NONE SEEN

## 2016-12-24 LAB — COMPREHENSIVE METABOLIC PANEL
ALBUMIN: 2.9 g/dL — AB (ref 3.5–5.0)
ALT: 13 U/L — ABNORMAL LOW (ref 17–63)
AST: 18 U/L (ref 15–41)
Alkaline Phosphatase: 54 U/L (ref 38–126)
Anion gap: 9 (ref 5–15)
BILIRUBIN TOTAL: 0.4 mg/dL (ref 0.3–1.2)
BUN: 22 mg/dL — AB (ref 6–20)
CHLORIDE: 103 mmol/L (ref 101–111)
CO2: 27 mmol/L (ref 22–32)
Calcium: 8.9 mg/dL (ref 8.9–10.3)
Creatinine, Ser: 1.33 mg/dL — ABNORMAL HIGH (ref 0.61–1.24)
GFR calc Af Amer: >60 mL/min (ref >60)
GFR calc non Af Amer: 56 mL/min — ABNORMAL LOW (ref >60)
GLUCOSE: 100 mg/dL — AB (ref 65–99)
POTASSIUM: 4.2 mmol/L (ref 3.5–5.1)
SODIUM: 139 mmol/L (ref 135–145)
Total Protein: 5.3 g/dL — ABNORMAL LOW (ref 6.5–8.1)

## 2016-12-24 LAB — CBC
HCT: 44 % (ref 39.0–52.0)
Hemoglobin: 15.5 g/dL (ref 13.0–17.0)
MCH: 33.1 pg (ref 26.0–34.0)
MCHC: 35.2 g/dL (ref 30.0–36.0)
MCV: 94 fL (ref 78.0–100.0)
PLATELETS: 177 10*3/uL (ref 150–400)
RBC: 4.68 MIL/uL (ref 4.22–5.81)
RDW: 13.8 % (ref 11.5–15.5)
WBC: 11.4 10*3/uL — ABNORMAL HIGH (ref 4.0–10.5)

## 2016-12-24 LAB — RAPID URINE DRUG SCREEN, HOSP PERFORMED
AMPHETAMINES: NOT DETECTED
BARBITURATES: NOT DETECTED
Benzodiazepines: NOT DETECTED
Cocaine: NOT DETECTED
OPIATES: NOT DETECTED
TETRAHYDROCANNABINOL: NOT DETECTED

## 2016-12-24 LAB — APTT: APTT: 28 s (ref 24–36)

## 2016-12-24 LAB — ETHANOL: Alcohol, Ethyl (B): <5 mg/dL (ref <5)

## 2016-12-24 LAB — CBG MONITORING, ED: GLUCOSE-CAPILLARY: 98 mg/dL (ref 65–99)

## 2016-12-24 LAB — PROTIME-INR
INR: 0.84
Prothrombin Time: 11.5 seconds (ref 11.4–15.2)

## 2016-12-24 LAB — TROPONIN I

## 2016-12-24 MED ORDER — LABETALOL HCL 5 MG/ML IV SOLN
10.0000 mg | Freq: Four times a day (QID) | INTRAVENOUS | Status: DC | PRN
  Administered 2016-12-25 (×2): 10 mg via INTRAVENOUS
  Filled 2016-12-24 (×2): qty 4

## 2016-12-24 MED ORDER — ACETAMINOPHEN 325 MG PO TABS
650.0000 mg | ORAL_TABLET | ORAL | Status: DC | PRN
  Administered 2016-12-25: 650 mg via ORAL
  Filled 2016-12-24: qty 2

## 2016-12-24 MED ORDER — LISINOPRIL 20 MG PO TABS
20.0000 mg | ORAL_TABLET | ORAL | Status: AC
  Administered 2016-12-24: 20 mg via ORAL
  Filled 2016-12-24: qty 1

## 2016-12-24 MED ORDER — IOPAMIDOL (ISOVUE-370) INJECTION 76%
INTRAVENOUS | Status: AC
  Administered 2016-12-24: 50 mL
  Filled 2016-12-24: qty 50

## 2016-12-24 MED ORDER — LABETALOL HCL 5 MG/ML IV SOLN
10.0000 mg | INTRAVENOUS | Status: AC
  Administered 2016-12-24: 10 mg via INTRAVENOUS

## 2016-12-24 MED ORDER — LABETALOL HCL 5 MG/ML IV SOLN
10.0000 mg | Freq: Once | INTRAVENOUS | Status: AC
  Administered 2016-12-24: 10 mg via INTRAVENOUS
  Filled 2016-12-24: qty 4

## 2016-12-24 MED ORDER — ACETAMINOPHEN 160 MG/5ML PO SOLN: 650.0000 mg | ORAL | Status: DC | PRN

## 2016-12-24 MED ORDER — ACETAMINOPHEN 325 MG PO TABS
650.0000 mg | ORAL_TABLET | Freq: Once | ORAL | Status: AC
  Administered 2016-12-24: 650 mg via ORAL
  Filled 2016-12-24: qty 2

## 2016-12-24 MED ORDER — ENOXAPARIN SODIUM 40 MG/0.4ML ~~LOC~~ SOLN
40.0000 mg | SUBCUTANEOUS | Status: DC
  Administered 2016-12-25: 40 mg via SUBCUTANEOUS
  Filled 2016-12-24: qty 0.4

## 2016-12-24 MED ORDER — ASPIRIN EC 325 MG PO TBEC
325.0000 mg | DELAYED_RELEASE_TABLET | Freq: Once | ORAL | Status: AC
  Administered 2016-12-24: 325 mg via ORAL
  Filled 2016-12-24: qty 1

## 2016-12-24 MED ORDER — ACETAMINOPHEN 650 MG RE SUPP: 650.0000 mg | RECTAL | Status: DC | PRN

## 2016-12-24 MED ORDER — ASPIRIN 300 MG RE SUPP: 300.0000 mg | Freq: Every day | RECTAL | Status: DC

## 2016-12-24 MED ORDER — SODIUM CHLORIDE 0.9 % IV SOLN
INTRAVENOUS | Status: AC
  Administered 2016-12-25: via INTRAVENOUS

## 2016-12-24 MED ORDER — ATORVASTATIN CALCIUM 40 MG PO TABS
40.0000 mg | ORAL_TABLET | Freq: Every day | ORAL | Status: DC
  Administered 2016-12-25: 40 mg via ORAL
  Filled 2016-12-24: qty 1

## 2016-12-24 MED ORDER — ASPIRIN 325 MG PO TABS
325.0000 mg | ORAL_TABLET | Freq: Every day | ORAL | Status: DC
  Administered 2016-12-25: 325 mg via ORAL
  Filled 2016-12-24: qty 1

## 2016-12-24 MED ORDER — LISINOPRIL 20 MG PO TABS
20.0000 mg | ORAL_TABLET | Freq: Every day | ORAL | Status: DC
  Administered 2016-12-25: 20 mg via ORAL
  Filled 2016-12-24: qty 1

## 2016-12-24 MED ORDER — STROKE: EARLY STAGES OF RECOVERY BOOK
Freq: Once | Status: AC
  Administered 2016-12-24: 23:00:00
  Filled 2016-12-24: qty 1

## 2016-12-24 MED ORDER — NICOTINE 14 MG/24HR TD PT24
14.0000 mg | MEDICATED_PATCH | Freq: Every day | TRANSDERMAL | Status: DC
  Administered 2016-12-25: 14 mg via TRANSDERMAL
  Filled 2016-12-24: qty 1

## 2016-12-24 NOTE — ED Notes (Signed)
Family  sts pt had slurred speech and some aphasia about 1630 today;
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
## 2016-12-24 NOTE — ED Notes (Signed)
Neuro at bedside.

## 2016-12-24 NOTE — ED Provider Notes (Signed)
7:46 PM Patient transferred from [facility] for acute Stroke. Symptoms resolved. Now has normal neuro exam. Still quite hypertensive, this appears to be an acute on chronic issue, is not on meds. Will give dose of labetalol for treatment of hypertensive emergency, and will admit to the hospitalist. No other signs/symptoms at this time. Neurology is consulting, will admit to the hospitalist in the step down unit.   Sherwood Gambler, MD 12/24/16 2207

## 2016-12-24 NOTE — ED Notes (Signed)
Attempted report x1. 
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
## 2016-12-24 NOTE — ED Notes (Signed)
Labetalol 10mg  IV (2nd dose)

## 2016-12-24 NOTE — ED Notes (Signed)
Labetalol 10mg  IV given (1st Dose)

## 2016-12-24 NOTE — Patient Instructions (Addendum)
Please go to the ER for further evaluation and treatment given your constant headaches, elevated blood pressure of 200/120, history of possible kidney disease and severe back pain.
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
## 2016-12-25 ENCOUNTER — Observation Stay (HOSPITAL_COMMUNITY): Payer: 59

## 2016-12-25 ENCOUNTER — Observation Stay (HOSPITAL_BASED_OUTPATIENT_CLINIC_OR_DEPARTMENT_OTHER): Payer: 59

## 2016-12-25 DIAGNOSIS — I361 Nonrheumatic tricuspid (valve) insufficiency: Secondary | ICD-10-CM | POA: Diagnosis not present

## 2016-12-25 DIAGNOSIS — G459 Transient cerebral ischemic attack, unspecified: Secondary | ICD-10-CM | POA: Diagnosis not present

## 2016-12-25 DIAGNOSIS — I340 Nonrheumatic mitral (valve) insufficiency: Secondary | ICD-10-CM | POA: Diagnosis not present

## 2016-12-25 DIAGNOSIS — I160 Hypertensive urgency: Secondary | ICD-10-CM | POA: Diagnosis not present

## 2016-12-25 LAB — ECHOCARDIOGRAM COMPLETE
Area-P 1/2: 1.79 cm2
CHL CUP MV DEC (S): 419
E decel time: 419 msec
EERAT: 11.12
FS: 30 % (ref 28–44)
HEIGHTINCHES: 66.5 in
IV/PV OW: 1.1
LA ID, A-P, ES: 39 mm
LA diam end sys: 39 mm
LA diam index: 2.14 cm/m2
LA vol A4C: 56.5 ml
LA vol index: 35.7 mL/m2
LA vol: 65.2 mL
LDCA: 3.14 cm2
LV E/e'average: 11.12
LV TDI E'LATERAL: 5.7
LVEEMED: 11.12
LVELAT: 5.7 cm/s
LVOTD: 20 mm
MV pk A vel: 99.5 m/s
MV pk E vel: 63.4 m/s
MVSPHT: 123 ms
PW: 10 mm — AB (ref 0.6–1.1)
RV LATERAL S' VELOCITY: 13.9 cm/s
RV TAPSE: 23.9 mm
TDI e' medial: 5.52
WEIGHTICAEL: 2480 [oz_av]

## 2016-12-25 LAB — BASIC METABOLIC PANEL
Anion gap: 10 (ref 5–15)
BUN: 18 mg/dL (ref 6–20)
CALCIUM: 8.4 mg/dL — AB (ref 8.9–10.3)
CO2: 23 mmol/L (ref 22–32)
Chloride: 108 mmol/L (ref 101–111)
Creatinine, Ser: 1.33 mg/dL — ABNORMAL HIGH (ref 0.61–1.24)
GFR, EST NON AFRICAN AMERICAN: 56 mL/min — AB (ref >60)
GLUCOSE: 143 mg/dL — AB (ref 65–99)
POTASSIUM: 3.5 mmol/L (ref 3.5–5.1)
Sodium: 141 mmol/L (ref 135–145)

## 2016-12-25 LAB — CK: Total CK: 16 U/L — ABNORMAL LOW (ref 49–397)

## 2016-12-25 LAB — LIPID PANEL
CHOL/HDL RATIO: 5.5 ratio
Cholesterol: 248 mg/dL — ABNORMAL HIGH (ref 0–200)
HDL: 45 mg/dL (ref >40)
LDL CALC: 166 mg/dL — AB (ref 0–99)
Triglycerides: 186 mg/dL — ABNORMAL HIGH (ref <150)
VLDL: 37 mg/dL (ref 0–40)

## 2016-12-25 LAB — HIV ANTIBODY (ROUTINE TESTING W REFLEX): HIV Screen 4th Generation wRfx: NONREACTIVE

## 2016-12-25 MED ORDER — ATORVASTATIN CALCIUM 40 MG PO TABS: 40.0000 mg | ORAL_TABLET | Freq: Every day | ORAL | 0 refills | Status: DC

## 2016-12-25 MED ORDER — ASPIRIN 325 MG PO TABS: 325.0000 mg | ORAL_TABLET | Freq: Every day | ORAL | 1 refills | Status: DC

## 2016-12-25 MED ORDER — LISINOPRIL 20 MG PO TABS: 20.0000 mg | ORAL_TABLET | Freq: Every day | ORAL | 0 refills | Status: DC

## 2016-12-25 MED ORDER — NICOTINE 14 MG/24HR TD PT24: 14.0000 mg | MEDICATED_PATCH | Freq: Every day | TRANSDERMAL | 0 refills | Status: DC

## 2016-12-25 MED ORDER — AMLODIPINE BESYLATE 5 MG PO TABS: 5.0000 mg | ORAL_TABLET | Freq: Every day | ORAL | 0 refills | Status: DC

## 2016-12-25 MED ORDER — AMLODIPINE BESYLATE 5 MG PO TABS
5.0000 mg | ORAL_TABLET | Freq: Every day | ORAL | Status: DC
  Administered 2016-12-25: 5 mg via ORAL
  Filled 2016-12-25: qty 1

## 2016-12-25 MED ORDER — AMLODIPINE BESYLATE 10 MG PO TABS: 10.0000 mg | ORAL_TABLET | Freq: Every day | ORAL | Status: DC

## 2016-12-25 NOTE — Progress Notes (Signed)
  Echocardiogram 2D Echocardiogram has been performed.  Albert Harris G Albert Harris 12/25/2016, 3:25 PM

## 2016-12-25 NOTE — Progress Notes (Signed)
PT Cancellation Note  Patient Details Name: Albert Harris MRN: [id number] DOB: [birth year]   Cancelled Treatment:    Reason Eval/Treat Not Completed: PT screened, no needs identified, will sign off. Pt is marked off as independent on the floor. Pt and wife report that he has been getting around well, have no equipment or therapy needs at this time. PT will sign off. Please re-order if any new needs arise.    Scheryl Marten PT, DPT  [phone]  12/25/2016, 12:15 PM

## 2016-12-25 NOTE — Progress Notes (Signed)
OT Cancellation Note  Patient Details Name: Albert Harris MRN: [id number] DOB: April 09, 1956   Cancelled Treatment:    Reason Eval/Treat Not Completed: OT screened, no needs identified, will sign off. Pt is marked off as independent on the floor. Pt and wife report that he has been getting around well, have no equipment or therapy needs at this time. OT will sign off. Please re-order if any new needs arise.     [name] [facility] 12/25/2016, 12:55 PM

## 2016-12-25 NOTE — Progress Notes (Signed)
Pt's B/P still showing high on Dinamap after pt took labetolol and rested. RN checked pt's B/P manually to confirm. While checking B/P RN heard noise beginning at 591 for diastolic that went away around 170 then picked back up again around 155 and didn't go away again until 90 for diastolic. 2nd noise seemed more prominent than first making RN think 155/90 was pt's actual B/P. RN contacted MD and explained situation to which MD also confirmed and thought 2nd noise sounded more like pt's actual B/P. Patient stated feels fine and doesn't know why it [B/P] keeps doing that. RN will continue to monitor.

## 2016-12-25 NOTE — Progress Notes (Signed)
Pt being discharged from hospital per orders from MD. Pt and family educated on discharge instructions. Pt and family verbalized understanding of instructions. All questions and concerns were addressed. Pt's IV's were removed prior to discharge. Pt exited hospital via ambulation.

## 2016-12-25 NOTE — Progress Notes (Signed)
SLP Cancellation Note  Patient Details Name: Albert Harris MRN: [id number] DOB: 01-30-1956   Cancelled treatment:       Reason Eval/Treat Not Completed: SLP screened, no needs identified, will sign off  Arvil Chaco MA, [place] 12/25/2016, 4:25 PM
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
## 2016-12-26 LAB — HEMOGLOBIN A1C
Hgb A1c MFr Bld: 5.4 % (ref 4.8–5.6)
Mean Plasma Glucose: 108 mg/dL
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
## 2016-12-28 ENCOUNTER — Telehealth: Payer: Self-pay | Admitting: Physician Assistant

## 2016-12-28 NOTE — Telephone Encounter (Signed)
Patient is scheduled. Nothing further needing to be done.

## 2016-12-28 NOTE — Telephone Encounter (Signed)
Left message on voicemail to call office. Pt needs to schedule one week hospital follow up.

## 2016-12-28 NOTE — Telephone Encounter (Signed)
Please call patient and let him know that we've reviewed his hospital admission. Please make a follow-up appointment with [place] in 1 week, so we can f/u and repeat some labs.  [name] PA-C 12/28/16

## 2017-01-05 ENCOUNTER — Encounter: Payer: Self-pay | Admitting: Physician Assistant

## 2017-01-05 ENCOUNTER — Encounter: Payer: Self-pay | Admitting: Neurology

## 2017-01-05 ENCOUNTER — Ambulatory Visit (INDEPENDENT_AMBULATORY_CARE_PROVIDER_SITE_OTHER): Payer: 59 | Admitting: Physician Assistant

## 2017-01-05 VITALS — BP 186/100 | HR 79 | Temp 98.2°F | Ht 66.5 in | Wt 156.0 lb

## 2017-01-05 DIAGNOSIS — Z720 Tobacco use: Secondary | ICD-10-CM

## 2017-01-05 DIAGNOSIS — E785 Hyperlipidemia, unspecified: Secondary | ICD-10-CM | POA: Diagnosis not present

## 2017-01-05 DIAGNOSIS — G459 Transient cerebral ischemic attack, unspecified: Secondary | ICD-10-CM | POA: Diagnosis not present

## 2017-01-05 DIAGNOSIS — I10 Essential (primary) hypertension: Secondary | ICD-10-CM | POA: Diagnosis not present

## 2017-01-05 LAB — CBC WITH DIFFERENTIAL/PLATELET
BASOS ABS: 0.1 10*3/uL (ref 0.0–0.1)
Basophils Relative: 1 % (ref 0.0–3.0)
Eosinophils Absolute: 0.3 10*3/uL (ref 0.0–0.7)
Eosinophils Relative: 2.7 % (ref 0.0–5.0)
HCT: 45.3 % (ref 39.0–52.0)
Hemoglobin: 15.3 g/dL (ref 13.0–17.0)
LYMPHS ABS: 2.1 10*3/uL (ref 0.7–4.0)
Lymphocytes Relative: 18 % (ref 12.0–46.0)
MCHC: 33.7 g/dL (ref 30.0–36.0)
MCV: 98.2 fl (ref 78.0–100.0)
MONO ABS: 0.9 10*3/uL (ref 0.1–1.0)
Monocytes Relative: 7.7 % (ref 3.0–12.0)
NEUTROS ABS: 8.4 10*3/uL — AB (ref 1.4–7.7)
NEUTROS PCT: 70.6 % (ref 43.0–77.0)
PLATELETS: 212 10*3/uL (ref 150.0–400.0)
RBC: 4.61 Mil/uL (ref 4.22–5.81)
RDW: 13.8 % (ref 11.5–15.5)
WBC: 11.9 10*3/uL — ABNORMAL HIGH (ref 4.0–10.5)

## 2017-01-05 LAB — BASIC METABOLIC PANEL
BUN: 18 mg/dL (ref 6–23)
CALCIUM: 9.3 mg/dL (ref 8.4–10.5)
CO2: 28 meq/L (ref 19–32)
Chloride: 104 mEq/L (ref 96–112)
Creatinine, Ser: 1.19 mg/dL (ref 0.40–1.50)
GFR: 65.71 mL/min (ref >60.00)
GLUCOSE: 102 mg/dL — AB (ref 70–99)
Potassium: 4.5 mEq/L (ref 3.5–5.1)
Sodium: 137 mEq/L (ref 135–145)

## 2017-01-05 MED ORDER — NICOTINE 14 MG/24HR TD PT24: 14.0000 mg | MEDICATED_PATCH | Freq: Every day | TRANSDERMAL | 0 refills | Status: DC

## 2017-01-05 MED ORDER — LISINOPRIL-HYDROCHLOROTHIAZIDE 20-12.5 MG PO TABS: 1.0000 | ORAL_TABLET | Freq: Every day | ORAL | 0 refills | Status: DC

## 2017-01-05 MED ORDER — AMLODIPINE BESYLATE 10 MG PO TABS: 10.0000 mg | ORAL_TABLET | Freq: Every day | ORAL | 0 refills | Status: DC

## 2017-01-05 NOTE — Patient Instructions (Signed)
Increase Norvasc to 10 mg daily (you may take 2 tablets of 5 mg daily)  Change lisinopril to new lisinopril-combo med, this has been sent in for you  We will call you with your lab results  Let's follow up in 2 weeks  I will put in a neuro referral and you will be contacted about this  Avoid red meat and salt

## 2017-01-05 NOTE — Progress Notes (Signed)
Albert Harris is a 61 y.o. male is here for Hospital follow up, was admitted on 7/19 for [name]'s.  I acted as a Education administrator for [employer], PA-C Albert Pickler, LPN  History of Present Illness:   Chief Complaint  Patient presents with  . Hospitalization Follow-up    Adm 7/19   . [name]'s  . Hypertension   Patient was hospitalized 7/19-7/20/18 for [name] and hypertensive urgency.  Work-up revealed: negative CT, MRI with small vessel ischemia, 2-D echo without any emboli and EF of 55-60%. BMP showed elevated creatinine of 1.33 with slightly reduced GFR of 56. No recent labs to compare this to.   Hypertension  This is a chronic problem. The problem has been gradually improving since onset. The problem is uncontrolled (Bp has been running 237-628 highest diastolic and systolic 31'D-17'O. ). Associated symptoms include headaches. Pertinent negatives include no blurred vision, chest pain, palpitations, peripheral edema or shortness of breath. Risk factors for coronary artery disease include smoking/tobacco exposure (Pt is using nicotine patch and has decreased to smoking a pack a day). Past treatments include calcium channel blockers and ACE inhibitors. The current treatment provides mild improvement. There are no compliance problems (Little walking).    BP Readings from Last 3 Encounters:  01/05/17 (!) 186/100  12/25/16 (!) 155/90  12/24/16 (!) 200/120    Tobacco Abuse Doing well with the nicotine patches. Down from 1.5 PPD to 1 PPD. Needs refill.  Hyperlipidemia LDL on 01/19/17 was 166. Started ASA 325 mg and Lipitor 40 mg. Tolerating without side effects.  [name] Denies any further stroke-like symptoms. Does not have a scheduled appointment with neurology, however he was told to see a neurologist within 6 weeks of hospital d/c. Has had a few minor headaches but he has treated with tylenol which has worked well for him.  ASCVD is 40.9%.  Health Maintenance Due  Topic Date Due  . Hepatitis  C Screening  1956-03-06  . TETANUS/TDAP  06/06/1973  . COLONOSCOPY  06/06/2004    Past Medical History:  Diagnosis Date  . Asthma   . Hypertension      Social History   Social History  . Marital status: Divorced    Spouse name: N/A  . Number of children: N/A  . Years of education: N/A   Occupational History  . Not on file.   Social History Main Topics  . Smoking status: Current Every Day Smoker    Packs/day: 1.00    Types: Cigarettes  . Smokeless tobacco: Never Used  . Alcohol use 3.6 oz/week    6 Cans of beer per week  . Drug use: No  . Sexual activity: No   Other Topics Concern  . Not on file   Social History Narrative   Married   Works in Audiological scientist    Past Surgical History:  Procedure Laterality Date  . FINGER SURGERY      Family History  Problem Relation Age of Onset  . Hypertension Mother   . Colon cancer Father   . Stomach cancer Brother     PMHx, SurgHx, SocialHx, FamHx, Medications, and Allergies were reviewed in the Visit Navigator and updated as appropriate.   Patient Active Problem List   Diagnosis Date Noted  . Hypertensive urgency 12/24/2016  . [name] (transient ischemic attack) 12/24/2016    Social History  Substance Use Topics  . Smoking status: Current Every Day Smoker    Packs/day: 1.00    Types: Cigarettes  .  Smokeless tobacco: Never Used  . Alcohol use 3.6 oz/week    6 Cans of beer per week    Current Medications and Allergies:    Current Outpatient Prescriptions:  .  aspirin 325 MG tablet, Take 1 tablet (325 mg total) by mouth daily., Disp: 30 tablet, Rfl: 1 .  aspirin-acetaminophen-caffeine (EXCEDRIN MIGRAINE) 250-250-65 MG tablet, Take 2 tablets by mouth every 6 (six) hours as needed for headache. , Disp: , Rfl:  .  atorvastatin (LIPITOR) 40 MG tablet, Take 1 tablet (40 mg total) by mouth daily at 6 PM., Disp: 30 tablet, Rfl: 0 .  nicotine (NICODERM CQ - DOSED IN MG/24 HOURS) 14 mg/24hr patch, Place 1  patch (14 mg total) onto the skin daily., Disp: 28 patch, Rfl: 0 .  amLODipine (NORVASC) 10 MG tablet, Take 1 tablet (10 mg total) by mouth daily., Disp: 30 tablet, Rfl: 0 .  lisinopril-hydrochlorothiazide (ZESTORETIC) 20-12.5 MG tablet, Take 1 tablet by mouth daily., Disp: 30 tablet, Rfl: 0  No Known Allergies  Review of Systems   Review of Systems  Eyes: Negative for blurred vision.  Respiratory: Negative for shortness of breath.   Cardiovascular: Negative for chest pain, palpitations and leg swelling.  Neurological: Positive for headaches.    Vitals:   Vitals:   01/05/17 1026 01/05/17 1059  BP: (!) 180/100 (!) 186/100  Pulse: 79 79  Temp: 98.2 F (36.8 C)   TempSrc: Oral   SpO2: 97%   Weight: 156 lb (70.8 kg)   Height: 5' 6.5" (1.689 m)      Body mass index is 24.8 kg/m.   Physical Exam:    Physical Exam  Constitutional: He appears well-developed. He is cooperative.  Non-toxic appearance. He does not have a sickly appearance. He does not appear ill. No distress.  Cardiovascular: Normal rate, regular rhythm, S1 normal, S2 normal, normal heart sounds and normal pulses.   No LE edema  Pulmonary/Chest: Effort normal and breath sounds normal.  Neurological: He is alert. He has normal strength. No cranial nerve deficit or sensory deficit. Coordination and gait normal. GCS eye subscore is 4. GCS verbal subscore is 5. GCS motor subscore is 6.  Skin: Skin is warm, dry and intact.  Psychiatric: He has a normal mood and affect. His speech is normal and behavior is normal.  Nursing note and vitals reviewed.    Assessment and Plan:    Albert Harris was seen today for hospitalization follow-up, tia's and hypertension.  Diagnoses and all orders for this visit:  Hypertension, unspecified type Will re-check BMP and CBC today. Increase Norvasc to 10mg . Change Lisinopril 20mg  to Zestoretic (Lisinopril 20- HCTZ 12.5mg .)  Follow-up in 2 weeks. Discussed need to continue to check blood  pressure at home regularly.  -     Basic metabolic panel -     CBC with Differential/Platelet  Tobacco abuse Doing well with the nicotine patches. Down from 1.5 PPD to 1 PPD. Needs refill. Encouraged continued cessation.  Hyperlipidemia, unspecified hyperlipidemia type Continue Lipitor 40 mg, tolerating well.  Transient cerebral ischemia, unspecified type Neurology referral. Discussed stroke-like symptoms and advised patient to immediately go to the ED if any symptoms recur. Patient and daughter verbalized understanding. -     Ambulatory referral to Neurology  Other orders -     nicotine (NICODERM CQ - DOSED IN MG/24 HOURS) 14 mg/24hr patch; Place 1 patch (14 mg total) onto the skin daily. -     amLODipine (NORVASC) 10 MG tablet; Take 1 tablet (  10 mg total) by mouth daily. -     lisinopril-hydrochlorothiazide (ZESTORETIC) 20-12.5 MG tablet; Take 1 tablet by mouth daily.  . Reviewed expectations re: course of current medical issues. . Discussed self-management of symptoms. . Outlined signs and symptoms indicating need for more acute intervention. . Patient verbalized understanding and all questions were answered. . See orders for this visit as documented in the electronic medical record. . Patient received an After Visit Summary.  CMA or LPN served as scribe during this visit. History, Physical, and Plan performed by medical provider. Documentation and orders reviewed and attested to.  [name], PA-C [place], [facility] [place] 01/05/2017  Follow-up: Return in about 2 weeks (around 01/19/2017) for blood pressure follow-up with Aldona Bar.

## 2017-01-18 NOTE — Progress Notes (Signed)
Albert Harris is a 61 y.o. male is here for 2 week follow up Hypertension  I acted as a Education administrator for [employer], PA-C Anselmo Pickler, LPN  History of Present Illness:   Chief Complaint  Patient presents with  . Follow-up    2 week  . Hypertension    Hypertension  This is a chronic problem. Episode onset: Here for 2 week follow up, Bp running in AM highest 191/96, lowest 144/78, in PM highest 179/100, lowest 138/72. The problem has been waxing and waning since onset. The problem is uncontrolled. Associated symptoms include malaise/fatigue. Pertinent negatives include no blurred vision, chest pain, headaches, palpitations, peripheral edema or shortness of breath. (C/o dizziness 2 hours after taking Amlodipine 10 mg and last for few hours off and on.) Risk factors for coronary artery disease include smoking/tobacco exposure. Past treatments include calcium channel blockers. The current treatment provides mild improvement. Compliance problems include medication side effects (Dizzy, not wearing patch all the time. Smoking one pack a day).    Having some balance issues, states that he sometimes "walks into walls." Has been urinating more often since starting the combo med with HCTZ, however he only drinks tea, no regular water. HA's have improved since last visit. No changes in appetite.  Smoking 1 PPD -- not always using the patch.   BP Readings from Last 3 Encounters:  01/19/17 (!) 166/90  01/05/17 (!) 186/100  12/25/16 (!) 155/90     Health Maintenance Due  Topic Date Due  . Hepatitis C Screening  10/26/1955  . TETANUS/TDAP  06/06/1973  . COLONOSCOPY  06/06/2004  . INFLUENZA VACCINE  01/06/2017    Past Medical History:  Diagnosis Date  . Asthma   . Hypertension      Social History   Social History  . Marital status: Divorced    Spouse name: N/A  . Number of children: N/A  . Years of education: N/A   Occupational History  . Not on file.   Social History Main  Topics  . Smoking status: Current Every Day Smoker    Packs/day: 1.00    Types: Cigarettes  . Smokeless tobacco: Never Used  . Alcohol use 3.6 oz/week    6 Cans of beer per week  . Drug use: No  . Sexual activity: No   Other Topics Concern  . Not on file   Social History Narrative   Married   Works in Audiological scientist    Past Surgical History:  Procedure Laterality Date  . FINGER SURGERY      Family History  Problem Relation Age of Onset  . Hypertension Mother   . Colon cancer Father   . Stomach cancer Brother     PMHx, SurgHx, SocialHx, FamHx, Medications, and Allergies were reviewed in the Visit Navigator and updated as appropriate.   Patient Active Problem List   Diagnosis Date Noted  . Hypertensive urgency 12/24/2016  . [name] (transient ischemic attack) 12/24/2016    Social History  Substance Use Topics  . Smoking status: Current Every Day Smoker    Packs/day: 1.00    Types: Cigarettes  . Smokeless tobacco: Never Used  . Alcohol use 3.6 oz/week    6 Cans of beer per week    Current Medications and Allergies:    Current Outpatient Prescriptions:  .  amLODipine (NORVASC) 10 MG tablet, Take 1 tablet (10 mg total) by mouth daily., Disp: 30 tablet, Rfl: 0 .  aspirin 325 MG tablet,  Take 1 tablet (325 mg total) by mouth daily., Disp: 30 tablet, Rfl: 1 .  aspirin-acetaminophen-caffeine (EXCEDRIN MIGRAINE) 250-250-65 MG tablet, Take 2 tablets by mouth every 6 (six) hours as needed for headache. , Disp: , Rfl:  .  atorvastatin (LIPITOR) 40 MG tablet, Take 1 tablet (40 mg total) by mouth daily at 6 PM., Disp: 30 tablet, Rfl: 0 .  lisinopril-hydrochlorothiazide (ZESTORETIC) 20-12.5 MG tablet, Take 1 tablet by mouth daily., Disp: 30 tablet, Rfl: 0 .  nicotine (NICODERM CQ - DOSED IN MG/24 HOURS) 14 mg/24hr patch, Place 1 patch (14 mg total) onto the skin daily., Disp: 28 patch, Rfl: 0  No Known Allergies  Review of Systems   Review of Systems    Constitutional: Positive for malaise/fatigue. Negative for chills, fever and weight loss.  Eyes: Negative for blurred vision.  Respiratory: Negative for shortness of breath.   Cardiovascular: Negative for chest pain and palpitations.  Neurological: Negative for headaches.    Vitals:   Vitals:   01/19/17 0935  BP: (!) 166/90  Pulse: 76  Temp: 98.2 F (36.8 C)  TempSrc: Oral  SpO2: 97%  Weight: 155 lb (70.3 kg)  Height: 5' 6.5" (1.689 m)     Body mass index is 24.64 kg/m.   Physical Exam:    Physical Exam  Constitutional: He appears well-developed. He is cooperative.  Non-toxic appearance. He does not have a sickly appearance. He does not appear ill. No distress.  Cardiovascular: Normal rate, regular rhythm, S1 normal, S2 normal, normal heart sounds and normal pulses.   No LE edema  Pulmonary/Chest: Effort normal and breath sounds normal.  Neurological: He is alert. He has normal strength. No cranial nerve deficit or sensory deficit. He displays a negative Romberg sign. Coordination and gait normal. GCS eye subscore is 4. GCS verbal subscore is 5. GCS motor subscore is 6.  Skin: Skin is warm, dry and intact.  Psychiatric: He has a normal mood and affect. His speech is normal and behavior is normal.  Nursing note and vitals reviewed.    Assessment and Plan:    Albert Harris was seen today for follow-up and hypertension.  Diagnoses and all orders for this visit:  Hypertension, unspecified type -     Basic metabolic panel -     CBC with Differential/Platelet   Currently on 3 medications for BP: lisinopril-HCTZ 20-12.5mg  and Norvasc 10 mg. Blood pressure is very slowly improving. He has several cardiac risk factors -- would benefit from cardiology evaluation. Echo was performed in hospital to look for source of emboli on 12/25/2016 -- showed grade 1 diastolic dysfunction, mild MR, TR. No stress test has been performed to my knowledge. If labs are normal, may consider increasing  lisinopril-HCTZ 20-25mg . Discussed need for adequate hydration. Also reviewed stroke warning signs and red flags with patient. Follow-up in 1 month.  Tobacco Abuse Encouraged continued cessation and use of patch.  Dizziness Neuro exam benign today.  Neuro consult pending. Will obtain labs. Reviewed reviewed stroke warning signs and red flags with patient. I discussed with him that I have a very low threshold to send him to the ER if he has concerning symptoms.  . Reviewed expectations re: course of current medical issues. . Discussed self-management of symptoms. . Outlined signs and symptoms indicating need for more acute intervention. . Patient verbalized understanding and all questions were answered. . See orders for this visit as documented in the electronic medical record. . Patient received an After Visit Summary.  CMA  or LPN served as Education administrator during this visit. History, Physical, and Plan performed by medical provider. Documentation and orders reviewed and attested to.  [name], PA-C [place], [facility] [place] 01/19/2017  Follow-up: No Follow-up on file.

## 2017-01-19 ENCOUNTER — Other Ambulatory Visit: Payer: Self-pay | Admitting: Physician Assistant

## 2017-01-19 ENCOUNTER — Ambulatory Visit (INDEPENDENT_AMBULATORY_CARE_PROVIDER_SITE_OTHER): Payer: 59 | Admitting: Physician Assistant

## 2017-01-19 ENCOUNTER — Encounter: Payer: Self-pay | Admitting: Physician Assistant

## 2017-01-19 VITALS — BP 166/90 | HR 76 | Temp 98.2°F | Ht 66.5 in | Wt 155.0 lb

## 2017-01-19 DIAGNOSIS — Z720 Tobacco use: Secondary | ICD-10-CM | POA: Insufficient documentation

## 2017-01-19 DIAGNOSIS — I10 Essential (primary) hypertension: Secondary | ICD-10-CM | POA: Diagnosis not present

## 2017-01-19 DIAGNOSIS — R42 Dizziness and giddiness: Secondary | ICD-10-CM | POA: Diagnosis not present

## 2017-01-19 LAB — CBC WITH DIFFERENTIAL/PLATELET
BASOS ABS: 0.1 10*3/uL (ref 0.0–0.1)
Basophils Relative: 0.5 % (ref 0.0–3.0)
EOS PCT: 3.1 % (ref 0.0–5.0)
Eosinophils Absolute: 0.4 10*3/uL (ref 0.0–0.7)
HCT: 47.5 % (ref 39.0–52.0)
HEMOGLOBIN: 15.8 g/dL (ref 13.0–17.0)
LYMPHS ABS: 2.3 10*3/uL (ref 0.7–4.0)
LYMPHS PCT: 19.3 % (ref 12.0–46.0)
MCHC: 33.2 g/dL (ref 30.0–36.0)
MCV: 98.6 fl (ref 78.0–100.0)
MONOS PCT: 6.4 % (ref 3.0–12.0)
Monocytes Absolute: 0.8 10*3/uL (ref 0.1–1.0)
NEUTROS PCT: 70.7 % (ref 43.0–77.0)
Neutro Abs: 8.4 10*3/uL — ABNORMAL HIGH (ref 1.4–7.7)
Platelets: 232 10*3/uL (ref 150.0–400.0)
RBC: 4.82 Mil/uL (ref 4.22–5.81)
RDW: 13.8 % (ref 11.5–15.5)
WBC: 11.8 10*3/uL — AB (ref 4.0–10.5)

## 2017-01-19 LAB — BASIC METABOLIC PANEL
BUN: 23 mg/dL (ref 6–23)
CALCIUM: 9.2 mg/dL (ref 8.4–10.5)
CO2: 29 mEq/L (ref 19–32)
Chloride: 105 mEq/L (ref 96–112)
Creatinine, Ser: 1.32 mg/dL (ref 0.40–1.50)
GFR: 58.3 mL/min — AB (ref >60.00)
GLUCOSE: 113 mg/dL — AB (ref 70–99)
POTASSIUM: 4.4 meq/L (ref 3.5–5.1)
SODIUM: 138 meq/L (ref 135–145)

## 2017-01-19 MED ORDER — LISINOPRIL-HYDROCHLOROTHIAZIDE 20-25 MG PO TABS: 1.0000 | ORAL_TABLET | Freq: Every day | ORAL | 0 refills | Status: DC

## 2017-01-19 MED ORDER — AMLODIPINE BESYLATE 10 MG PO TABS: 10.0000 mg | ORAL_TABLET | Freq: Every day | ORAL | 0 refills | Status: DC

## 2017-01-19 NOTE — Patient Instructions (Signed)
Work on hydration -- please drink a full glass of water prior to any non-water beverages.  Continue to check your blood pressure for [place].  We are going to send you to cardiology for further evaluation and management of your high blood pressure.  PLEASE KEEP YOUR APPOINTMENT WITH NEUROLOGY -- IT IS VERY IMPORTANT THAT YOU GO!  If you develop any confusion, slurred speech, worsening dizziness, changes in vision -- please go to the emergency room.

## 2017-01-20 ENCOUNTER — Telehealth: Payer: Self-pay | Admitting: Physician Assistant

## 2017-01-20 NOTE — Telephone Encounter (Signed)
Patient returning call. He wasn't sure who contacted him but then he mentioned your name.  Ty,  -LL

## 2017-01-20 NOTE — Telephone Encounter (Signed)
See result notes. 

## 2017-02-17 ENCOUNTER — Ambulatory Visit: Payer: 59 | Admitting: Cardiovascular Disease

## 2017-02-19 ENCOUNTER — Encounter: Payer: Self-pay | Admitting: Physician Assistant

## 2017-02-19 ENCOUNTER — Ambulatory Visit (INDEPENDENT_AMBULATORY_CARE_PROVIDER_SITE_OTHER): Payer: 59 | Admitting: Physician Assistant

## 2017-02-19 VITALS — BP 140/84 | HR 76 | Temp 98.1°F | Ht 66.5 in | Wt 152.5 lb

## 2017-02-19 DIAGNOSIS — Z23 Encounter for immunization: Secondary | ICD-10-CM | POA: Diagnosis not present

## 2017-02-19 DIAGNOSIS — Z1159 Encounter for screening for other viral diseases: Secondary | ICD-10-CM | POA: Diagnosis not present

## 2017-02-19 DIAGNOSIS — Z720 Tobacco use: Secondary | ICD-10-CM

## 2017-02-19 DIAGNOSIS — I10 Essential (primary) hypertension: Secondary | ICD-10-CM

## 2017-02-19 LAB — BASIC METABOLIC PANEL
BUN: 22 mg/dL (ref 7–25)
CHLORIDE: 106 mmol/L (ref 98–110)
CO2: 26 mmol/L (ref 20–32)
CREATININE: 1.15 mg/dL (ref 0.70–1.25)
Calcium: 9.2 mg/dL (ref 8.6–10.3)
Glucose, Bld: 91 mg/dL (ref 65–99)
Potassium: 4.6 mmol/L (ref 3.5–5.3)
Sodium: 140 mmol/L (ref 135–146)

## 2017-02-19 MED ORDER — AMLODIPINE BESYLATE 10 MG PO TABS: 10.0000 mg | ORAL_TABLET | Freq: Every day | ORAL | 0 refills | Status: DC

## 2017-02-19 MED ORDER — ATORVASTATIN CALCIUM 40 MG PO TABS: 40.0000 mg | ORAL_TABLET | Freq: Every day | ORAL | 0 refills | Status: DC

## 2017-02-19 MED ORDER — LISINOPRIL-HYDROCHLOROTHIAZIDE 20-12.5 MG PO TABS: 1.0000 | ORAL_TABLET | Freq: Every day | ORAL | 0 refills | Status: DC

## 2017-02-19 NOTE — Progress Notes (Signed)
Albert Harris is a 61 y.o. male is here for follow up on blood pressure.  I acted as a Education administrator for [employer], PA-C Anselmo Pickler, LPN  History of Present Illness:   Chief Complaint  Patient presents with  . Follow-up  . Hypertension    Hypertension  This is a chronic problem. Episode onset: was diagnosed in July, pt taking blood pressure at home Systolic  is ranging 536-644, diastolic ranging 03-47. The problem has been gradually improving since onset. The problem is controlled. Pertinent negatives include no anxiety, blurred vision, chest pain, headaches, malaise/fatigue, palpitations, peripheral edema or shortness of breath. Risk factors for coronary artery disease include dyslipidemia, smoking/tobacco exposure and male gender. The current treatment provides moderate improvement. Compliance problems: Pt missed his missed blood pressure 3 x's out of the month.    BP Readings from Last 3 Encounters:  02/19/17 140/84  01/19/17 (!) 166/90  01/05/17 (!) 186/100   Dizziness has resolved. Takes medicines in the morning. Has been trying to increase his plain water intake since we last talked. Has an appointment with cardiology next month.  Smoking Cessation Has been smoking all of his life. Doesn't smoke as much with the patch. Has limited interest in quitting at this time   Health Maintenance Due  Topic Date Due  . Hepatitis C Screening  1955/10/20  . COLONOSCOPY  06/06/2004    Past Medical History:  Diagnosis Date  . Asthma   . Hypertension      Social History   Social History  . Marital status: Divorced    Spouse name: N/A  . Number of children: N/A  . Years of education: N/A   Occupational History  . Not on file.   Social History Main Topics  . Smoking status: Current Every Day Smoker    Packs/day: 1.00    Types: Cigarettes  . Smokeless tobacco: Never Used  . Alcohol use 3.6 oz/week    6 Cans of beer per week  . Drug use: No  . Sexual activity: No    Other Topics Concern  . Not on file   Social History Narrative   Married   Works in Audiological scientist    Past Surgical History:  Procedure Laterality Date  . FINGER SURGERY      Family History  Problem Relation Age of Onset  . Hypertension Mother   . Colon cancer Father   . Stomach cancer Brother     PMHx, SurgHx, SocialHx, FamHx, Medications, and Allergies were reviewed in the Visit Navigator and updated as appropriate.   Patient Active Problem List   Diagnosis Date Noted  . Tobacco abuse 01/19/2017  . [name] (transient ischemic attack) 12/24/2016    Social History  Substance Use Topics  . Smoking status: Current Every Day Smoker    Packs/day: 1.00    Types: Cigarettes  . Smokeless tobacco: Never Used  . Alcohol use 3.6 oz/week    6 Cans of beer per week    Current Medications and Allergies:    Current Outpatient Prescriptions:  .  amLODipine (NORVASC) 10 MG tablet, Take 1 tablet (10 mg total) by mouth daily., Disp: 30 tablet, Rfl: 0 .  aspirin 325 MG tablet, Take 1 tablet (325 mg total) by mouth daily., Disp: 30 tablet, Rfl: 1 .  aspirin-acetaminophen-caffeine (EXCEDRIN MIGRAINE) 250-250-65 MG tablet, Take 2 tablets by mouth every 6 (six) hours as needed for headache. , Disp: , Rfl:  .  atorvastatin (LIPITOR) 40  MG tablet, Take 1 tablet (40 mg total) by mouth daily at 6 PM., Disp: 30 tablet, Rfl: 0 .  lisinopril-hydrochlorothiazide (PRINZIDE,ZESTORETIC) 20-12.5 MG tablet, Take 1 tablet by mouth daily., Disp: 30 tablet, Rfl: 0 .  nicotine (NICODERM CQ - DOSED IN MG/24 HOURS) 14 mg/24hr patch, Place 1 patch (14 mg total) onto the skin daily., Disp: 28 patch, Rfl: 0  No Known Allergies  Review of Systems   Review of Systems  Constitutional: Negative for malaise/fatigue.  Eyes: Negative for blurred vision.  Respiratory: Negative for shortness of breath.   Cardiovascular: Negative for chest pain and palpitations.  Neurological: Negative for  headaches.    Vitals:   Vitals:   02/19/17 1020  BP: 140/84  Pulse: 76  Temp: 98.1 F (36.7 C)  TempSrc: Oral  SpO2: 97%  Weight: 152 lb 8 oz (69.2 kg)  Height: 5' 6.5" (1.689 m)     Body mass index is 24.25 kg/m.   Physical Exam:    Physical Exam  Constitutional: He appears well-developed. He is cooperative.  Non-toxic appearance. He does not have a sickly appearance. He does not appear ill. No distress.  Cardiovascular: Normal rate, regular rhythm, S1 normal, S2 normal, normal heart sounds and normal pulses.   No LE edema  Pulmonary/Chest: Effort normal and breath sounds normal.  Neurological: He is alert. GCS eye subscore is 4. GCS verbal subscore is 5. GCS motor subscore is 6.  Skin: Skin is warm, dry and intact.  Psychiatric: He has a normal mood and affect. His speech is normal and behavior is normal.  Nursing note and vitals reviewed.    Assessment and Plan:    Markcus was seen today for follow-up and hypertension.  Diagnoses and all orders for this visit:  Hypertension, unspecified type Blood pressure improving. Patient was unable to increase Prinzide 2/2 side effects -- will continue current dosage. Continue Norvasc 10 mg. Has initial visit with cardiology next month. -     Basic metabolic panel  Tobacco abuse Encouraged cessation, patient is still in pre-contemplation stage.   Encounter for screening for other viral diseases -     Hepatitis C antibody  Other orders -     Tdap vaccine greater than or equal to 7yo IM -     Cancel: Hepatitis C antibody, reflex -     amLODipine (NORVASC) 10 MG tablet; Take 1 tablet (10 mg total) by mouth daily. -     atorvastatin (LIPITOR) 40 MG tablet; Take 1 tablet (40 mg total) by mouth daily at 6 PM. -     lisinopril-hydrochlorothiazide (PRINZIDE,ZESTORETIC) 20-12.5 MG tablet; Take 1 tablet by mouth daily.    . Reviewed expectations re: course of current medical issues. . Discussed self-management of  symptoms. . Outlined signs and symptoms indicating need for more acute intervention. . Patient verbalized understanding and all questions were answered. . See orders for this visit as documented in the electronic medical record. . Patient received an After Visit Summary.  CMA or LPN served as scribe during this visit. History, Physical, and Plan performed by medical provider. Documentation and orders reviewed and attested to.  [name], PA-C [place], [facility] 02/19/2017  Follow-up: No Follow-up on file.

## 2017-02-20 LAB — HEPATITIS C ANTIBODY
HEP C AB: NONREACTIVE
SIGNAL TO CUT-OFF: 0.01 (ref <1.00)

## 2017-03-04 ENCOUNTER — Ambulatory Visit: Payer: 59 | Admitting: Neurology

## 2017-03-15 ENCOUNTER — Ambulatory Visit (INDEPENDENT_AMBULATORY_CARE_PROVIDER_SITE_OTHER): Payer: 59

## 2017-03-15 DIAGNOSIS — Z23 Encounter for immunization: Secondary | ICD-10-CM

## 2017-03-16 NOTE — Progress Notes (Deleted)
Cardiology Office Note   Date:  03/16/2017   ID:  Albert Harris, Albert Harris Nov 15, 1955, MRN [id number]  PCP:  [name], [place]  Cardiologist:   Skeet Latch, MD   No chief complaint on file.     History of Present Illness: Albert Harris is a 61 y.o. male with hypertension, hyperlipidemia, mild carotid stenosis, severe vertebral stenosis, asthma, tobacco abuse, and prior [name] who presents for management of hypertension.  Albert Harris has been working with [name], PA, to control his hypertension. He was referred to cardiology for consultation due to his resistant hypertension.  He had an echo 12/25/16 that revealed LVEF 55-60% with grade 1 diastolic dysfunction and  Mild MR/TR.  He was admitted 12/2016 with slurred speech thought to be due to a [name].  He presented with hypertensive urgency (BP 201/87).  CT showed <30% bilateral ICA stenosis and multiple areas of >70% vertebral stenosis.  Atorvastatin was added to his regimen.  He was treated with aspirin 325mg  and starting using a nicotine patch.     Past Medical History:  Diagnosis Date  . Asthma   . Hypertension     Past Surgical History:  Procedure Laterality Date  . FINGER SURGERY       Current Outpatient Prescriptions  Medication Sig Dispense Refill  . amLODipine (NORVASC) 10 MG tablet Take 1 tablet (10 mg total) by mouth daily. 30 tablet 0  . aspirin 325 MG tablet Take 1 tablet (325 mg total) by mouth daily. 30 tablet 1  . aspirin-acetaminophen-caffeine (EXCEDRIN MIGRAINE) 160-737-10 MG tablet Take 2 tablets by mouth every 6 (six) hours as needed for headache.     Marland Kitchen atorvastatin (LIPITOR) 40 MG tablet Take 1 tablet (40 mg total) by mouth daily at 6 PM. 30 tablet 0  . lisinopril-hydrochlorothiazide (PRINZIDE,ZESTORETIC) 20-12.5 MG tablet Take 1 tablet by mouth daily. 30 tablet 0  . nicotine (NICODERM CQ - DOSED IN MG/24 HOURS) 14 mg/24hr patch Place 1 patch (14 mg total) onto the skin daily. 28 patch 0   No current  facility-administered medications for this visit.     Allergies:   Patient has no known allergies.    Social History:  The patient  reports that he has been smoking Cigarettes.  He has been smoking about 1.00 pack per day. He has never used smokeless tobacco. He reports that he drinks about 3.6 oz of alcohol per week . He reports that he does not use drugs.   Family History:  [name] ***family history includes Colon cancer in his father; Hypertension in his mother; Stomach cancer in his brother.    ROS:  Please see the history of present illness.   Otherwise, review of systems are positive for {NONE DEFAULTED:18576::"none"}.   All other systems are reviewed and negative.    PHYSICAL EXAM: VS:  There were no vitals taken for this visit. , BMI There is no height or weight on file to calculate BMI. GENERAL:  Well appearing HEENT:  Pupils equal round and reactive, fundi not visualized, oral mucosa unremarkable NECK:  No jugular venous distention, waveform within normal limits, carotid upstroke brisk and symmetric, no bruits, no thyromegaly LYMPHATICS:  No cervical adenopathy LUNGS:  Clear to auscultation bilaterally HEART:  RRR.  PMI not displaced or sustained,S1 and S2 within normal limits, no S3, no S4, no clicks, no rubs, *** murmurs ABD:  Flat, positive bowel sounds normal in frequency in pitch, no bruits, no rebound, no guarding, no midline  pulsatile mass, no hepatomegaly, no splenomegaly EXT:  2 plus pulses throughout, no edema, no cyanosis no clubbing SKIN:  No rashes no nodules NEURO:  Cranial nerves II through XII grossly intact, motor grossly intact throughout PSYCH:  Cognitively intact, oriented to person place and time    EKG:  EKG {ACTION; IS/IS FEX:61470929} ordered today. The ekg ordered today demonstrates ***  Echo 12/25/16: Study Conclusions  - Left ventricle: The cavity size was normal. There was mild   concentric hypertrophy. Systolic function was normal.  The   estimated ejection fraction was in the range of 55% to 60%. Wall   motion was normal; there were no regional wall motion   abnormalities. Doppler parameters are consistent with abnormal   left ventricular relaxation (grade 1 diastolic dysfunction).   There was no evidence of elevated ventricular filling pressure by   Doppler parameters. - Aortic valve: There was trivial regurgitation. - Mitral valve: There was mild regurgitation. Valve area by   pressure half-time: 1.79 cm^2. - Left atrium: The atrium was mildly dilated. - Right ventricle: The cavity size was normal. Wall thickness was   normal. Systolic function was normal. - Tricuspid valve: There was mild regurgitation. - Pulmonary arteries: Systolic pressure was within the normal   range. - Inferior vena cava: The vessel was normal in size. - Pericardium, extracardiac: There was no pericardial effusion.  Impressions:  - No cardiac source of emboli was indentified.  Recent Labs: 12/24/2016: ALT 13 01/19/2017: Hemoglobin 15.8; Platelets 232.0 02/19/2017: BUN 22; Creat 1.15; Potassium 4.6; Sodium 140    Lipid Panel    Component Value Date/Time   CHOL 248 (H) 12/25/2016 0350   TRIG 186 (H) 12/25/2016 0350   HDL 45 12/25/2016 0350   CHOLHDL 5.5 12/25/2016 0350   VLDL 37 12/25/2016 0350   LDLCALC 166 (H) 12/25/2016 0350      Wt Readings from Last 3 Encounters:  02/19/17 69.2 kg (152 lb 8 oz)  01/19/17 70.3 kg (155 lb)  01/05/17 70.8 kg (156 lb)      ASSESSMENT AND PLAN:  ***   Current medicines are reviewed at length with the patient today.  The patient {ACTIONS; HAS/DOES NOT HAVE:19233} concerns regarding medicines.  The following changes have been made:  {PLAN; NO CHANGE:13088:s}  Labs/ tests ordered today include: *** No orders of the defined types were placed in this encounter.    Disposition:   FU with ***    This note was written with the assistance of speech recognition software.  Please  excuse any transcriptional errors.  Signed, Makyah Lavigne C. Oval Linsey, MD, [facility]  03/16/2017 5:51 PM    [place]

## 2017-03-22 ENCOUNTER — Ambulatory Visit: Payer: 59 | Admitting: Cardiovascular Disease

## 2017-04-05 ENCOUNTER — Other Ambulatory Visit: Payer: Self-pay | Admitting: Physician Assistant

## 2017-08-31 ENCOUNTER — Telehealth: Payer: Self-pay | Admitting: *Deleted

## 2017-08-31 NOTE — Telephone Encounter (Signed)
Left message on voicemail to call office. Needs 6 month follow up with [facility]. Please schedule.

## 2017-11-06 DIAGNOSIS — G459 Transient cerebral ischemic attack, unspecified: Secondary | ICD-10-CM

## 2017-11-06 HISTORY — DX: Transient cerebral ischemic attack, unspecified: G45.9

## 2017-11-09 ENCOUNTER — Observation Stay (HOSPITAL_COMMUNITY): Payer: Self-pay

## 2017-11-09 ENCOUNTER — Observation Stay (HOSPITAL_COMMUNITY)
Admission: EM | Admit: 2017-11-09 | Discharge: 2017-11-11 | Disposition: A | Discharge status: Home / Self Care | Payer: Self-pay | Attending: Emergency Medicine | Admitting: Emergency Medicine

## 2017-11-09 ENCOUNTER — Emergency Department (HOSPITAL_COMMUNITY): Payer: Self-pay

## 2017-11-09 ENCOUNTER — Encounter (HOSPITAL_COMMUNITY): Payer: Self-pay | Admitting: Emergency Medicine

## 2017-11-09 DIAGNOSIS — Z7982 Long term (current) use of aspirin: Secondary | ICD-10-CM | POA: Insufficient documentation

## 2017-11-09 DIAGNOSIS — Z720 Tobacco use: Secondary | ICD-10-CM | POA: Diagnosis present

## 2017-11-09 DIAGNOSIS — I129 Hypertensive chronic kidney disease with stage 1 through stage 4 chronic kidney disease, or unspecified chronic kidney disease: Secondary | ICD-10-CM | POA: Insufficient documentation

## 2017-11-09 DIAGNOSIS — R531 Weakness: Secondary | ICD-10-CM

## 2017-11-09 DIAGNOSIS — N186 End stage renal disease: Secondary | ICD-10-CM | POA: Diagnosis present

## 2017-11-09 DIAGNOSIS — I160 Hypertensive urgency: Secondary | ICD-10-CM | POA: Insufficient documentation

## 2017-11-09 DIAGNOSIS — N183 Chronic kidney disease, stage 3 unspecified: Secondary | ICD-10-CM | POA: Diagnosis present

## 2017-11-09 DIAGNOSIS — J45909 Unspecified asthma, uncomplicated: Secondary | ICD-10-CM | POA: Insufficient documentation

## 2017-11-09 DIAGNOSIS — R05 Cough: Secondary | ICD-10-CM

## 2017-11-09 DIAGNOSIS — R059 Cough, unspecified: Secondary | ICD-10-CM

## 2017-11-09 DIAGNOSIS — Z79899 Other long term (current) drug therapy: Secondary | ICD-10-CM | POA: Insufficient documentation

## 2017-11-09 DIAGNOSIS — G459 Transient cerebral ischemic attack, unspecified: Principal | ICD-10-CM | POA: Insufficient documentation

## 2017-11-09 LAB — I-STAT CHEM 8, ED
BUN: 21 mg/dL — ABNORMAL HIGH (ref 6–20)
CHLORIDE: 102 mmol/L (ref 101–111)
Calcium, Ion: 1.23 mmol/L (ref 1.15–1.40)
Creatinine, Ser: 1.4 mg/dL — ABNORMAL HIGH (ref 0.61–1.24)
Glucose, Bld: 98 mg/dL (ref 65–99)
HCT: 49 % (ref 39.0–52.0)
Hemoglobin: 16.7 g/dL (ref 13.0–17.0)
Potassium: 3.9 mmol/L (ref 3.5–5.1)
SODIUM: 137 mmol/L (ref 135–145)
TCO2: 25 mmol/L (ref 22–32)

## 2017-11-09 LAB — RAPID URINE DRUG SCREEN, HOSP PERFORMED
AMPHETAMINES: NOT DETECTED
Barbiturates: NOT DETECTED
Benzodiazepines: NOT DETECTED
Cocaine: NOT DETECTED
Opiates: NOT DETECTED
TETRAHYDROCANNABINOL: NOT DETECTED

## 2017-11-09 LAB — PROTIME-INR
INR: 0.88
Prothrombin Time: 11.8 seconds (ref 11.4–15.2)

## 2017-11-09 LAB — CBC
HEMATOCRIT: 48.5 % (ref 39.0–52.0)
HEMOGLOBIN: 16.1 g/dL (ref 13.0–17.0)
MCH: 31.5 pg (ref 26.0–34.0)
MCHC: 33.2 g/dL (ref 30.0–36.0)
MCV: 94.9 fL (ref 78.0–100.0)
Platelets: 271 10*3/uL (ref 150–400)
RBC: 5.11 MIL/uL (ref 4.22–5.81)
RDW: 13.1 % (ref 11.5–15.5)
WBC: 11.9 10*3/uL — AB (ref 4.0–10.5)

## 2017-11-09 LAB — COMPREHENSIVE METABOLIC PANEL
ALBUMIN: 2.6 g/dL — AB (ref 3.5–5.0)
ALK PHOS: 90 U/L (ref 38–126)
ALT: 12 U/L — ABNORMAL LOW (ref 17–63)
ANION GAP: 10 (ref 5–15)
AST: 21 U/L (ref 15–41)
BUN: 22 mg/dL — ABNORMAL HIGH (ref 6–20)
CALCIUM: 9.5 mg/dL (ref 8.9–10.3)
CO2: 24 mmol/L (ref 22–32)
Chloride: 103 mmol/L (ref 101–111)
Creatinine, Ser: 1.39 mg/dL — ABNORMAL HIGH (ref 0.61–1.24)
GFR calc Af Amer: >60 mL/min (ref >60)
GFR calc non Af Amer: 52 mL/min — ABNORMAL LOW (ref >60)
GLUCOSE: 91 mg/dL (ref 65–99)
Potassium: 3.9 mmol/L (ref 3.5–5.1)
SODIUM: 137 mmol/L (ref 135–145)
Total Bilirubin: 0.5 mg/dL (ref 0.3–1.2)
Total Protein: 6.4 g/dL — ABNORMAL LOW (ref 6.5–8.1)

## 2017-11-09 LAB — DIFFERENTIAL
ABS IMMATURE GRANULOCYTES: 0 10*3/uL (ref 0.0–0.1)
BASOS ABS: 0.1 10*3/uL (ref 0.0–0.1)
Basophils Relative: 1 %
EOS PCT: 2 %
Eosinophils Absolute: 0.3 10*3/uL (ref 0.0–0.7)
IMMATURE GRANULOCYTES: 0 %
LYMPHS PCT: 23 %
Lymphs Abs: 2.8 10*3/uL (ref 0.7–4.0)
Monocytes Absolute: 0.8 10*3/uL (ref 0.1–1.0)
Monocytes Relative: 7 %
NEUTROS ABS: 8 10*3/uL — AB (ref 1.7–7.7)
Neutrophils Relative %: 67 %

## 2017-11-09 LAB — APTT: APTT: 31 s (ref 24–36)

## 2017-11-09 LAB — CBG MONITORING, ED: GLUCOSE-CAPILLARY: 76 mg/dL (ref 65–99)

## 2017-11-09 LAB — I-STAT TROPONIN, ED: Troponin i, poc: 0.03 ng/mL (ref 0.00–0.08)

## 2017-11-09 MED ORDER — STROKE: EARLY STAGES OF RECOVERY BOOK
Freq: Once | Status: AC
  Administered 2017-11-09: 22:00:00
  Filled 2017-11-09: qty 1

## 2017-11-09 MED ORDER — ACETAMINOPHEN 160 MG/5ML PO SOLN: 650.0000 mg | ORAL | Status: DC | PRN

## 2017-11-09 MED ORDER — ACETAMINOPHEN 325 MG PO TABS
650.0000 mg | ORAL_TABLET | ORAL | Status: DC | PRN
  Administered 2017-11-10 – 2017-11-11 (×2): 650 mg via ORAL
  Filled 2017-11-09 (×2): qty 2

## 2017-11-09 MED ORDER — ASPIRIN 325 MG PO TABS
325.0000 mg | ORAL_TABLET | Freq: Every day | ORAL | Status: DC
  Administered 2017-11-10 – 2017-11-11 (×2): 325 mg via ORAL
  Filled 2017-11-09 (×2): qty 1

## 2017-11-09 MED ORDER — LORAZEPAM 2 MG/ML IJ SOLN
0.5000 mg | Freq: Once | INTRAMUSCULAR | Status: AC
  Administered 2017-11-10: 0.5 mg via INTRAVENOUS
  Filled 2017-11-09: qty 1

## 2017-11-09 MED ORDER — NICOTINE 21 MG/24HR TD PT24
21.0000 mg | MEDICATED_PATCH | Freq: Once | TRANSDERMAL | Status: AC
  Administered 2017-11-09: 21 mg via TRANSDERMAL
  Filled 2017-11-09: qty 1

## 2017-11-09 MED ORDER — AMLODIPINE BESYLATE 10 MG PO TABS
10.0000 mg | ORAL_TABLET | Freq: Every day | ORAL | Status: DC
  Administered 2017-11-09 – 2017-11-11 (×3): 10 mg via ORAL
  Filled 2017-11-09 (×2): qty 1
  Filled 2017-11-09 (×2): qty 2

## 2017-11-09 MED ORDER — LISINOPRIL 20 MG PO TABS
20.0000 mg | ORAL_TABLET | Freq: Every day | ORAL | Status: DC
  Administered 2017-11-09 – 2017-11-10 (×2): 20 mg via ORAL
  Filled 2017-11-09 (×2): qty 1

## 2017-11-09 MED ORDER — LISINOPRIL-HYDROCHLOROTHIAZIDE 20-12.5 MG PO TABS: 1.0000 | ORAL_TABLET | Freq: Every day | ORAL | Status: DC

## 2017-11-09 MED ORDER — HYDROCHLOROTHIAZIDE 12.5 MG PO CAPS
12.5000 mg | ORAL_CAPSULE | Freq: Every day | ORAL | Status: DC
  Administered 2017-11-09 – 2017-11-10 (×2): 12.5 mg via ORAL
  Filled 2017-11-09 (×2): qty 1

## 2017-11-09 MED ORDER — ACETAMINOPHEN 650 MG RE SUPP: 650.0000 mg | RECTAL | Status: DC | PRN

## 2017-11-09 MED ORDER — ATORVASTATIN CALCIUM 20 MG PO TABS
40.0000 mg | ORAL_TABLET | Freq: Once | ORAL | Status: AC
  Administered 2017-11-09: 40 mg via ORAL
  Filled 2017-11-09: qty 2

## 2017-11-09 MED ORDER — ENOXAPARIN SODIUM 40 MG/0.4ML ~~LOC~~ SOLN
40.0000 mg | SUBCUTANEOUS | Status: DC
  Administered 2017-11-09 – 2017-11-10 (×2): 40 mg via SUBCUTANEOUS
  Filled 2017-11-09 (×2): qty 0.4

## 2017-11-09 MED ORDER — HYDRALAZINE HCL 20 MG/ML IJ SOLN
10.0000 mg | INTRAMUSCULAR | Status: DC | PRN
  Administered 2017-11-09: 10 mg via INTRAVENOUS
  Filled 2017-11-09: qty 1

## 2017-11-09 NOTE — H&P (Addendum)
History and Physical    Albert Harris IOE:[id number] DOB: 13-Sep-1955 DOA: 11/09/2017  Referring MD/NP/PA: Joline Maxcy, PA-C PCP: [name], PA  Patient coming from: Home  Chief Complaint: Right-sided numbness and weakness  I have personally briefly reviewed patient's old medical records in [place]   HPI: Albert Harris is a 62 y.o. male with medical history significant of HTN, [name], asthma, and tobacco abuse; who presents with complaints of intermittent right-sided numbness and weakness.  Symptoms initially started 3 weeks ago patient reported with tingling numbness sensation down  his right upper and lower extremity.  Symptoms lasted several minutes and then self resolved.  Reports similar episode occurring 1 week ago, and then 3 times today.  Symptoms waxed and waned in severity.  He was unable to grab onto anything and reported stumbling around his home.  Associated symptoms included mild headache, reports of slurred speech by wife, and mild intermittent cough that he reports is chronic.  Denies having any falls, nausea, vomiting, change of vision, chest pain, palpitations, or shortness of breath.  Symptoms occurred patient reported taking 325 mg aspirin.  He has been out of blood pressure medications for at least the last 1 month.  He makes note that his wife has lost her job and therefore it and lost their insurance and we are unable to afford continuation of care with a primary care provider.  At this time patient continues to smoke approximately 2 packs of cigarettes per day on average.  Denies any daily alcohol use or illicit drug use.  ED Course: Upon admission into the emergency department patient was seen to be afebrile pulse 67-97, respiration 14-22, blood pressure 193/103-238/124, and o2sats 99-100%.  Labs revealed WBC 11.9, BUN 21,  and creatinine 1.4.  Symptoms arise and's and slurred speech resolved at this time.  Patient was restarted on home blood pressure  medications of amlodipine, lisinopril, and hydrochlorothiazide that he had been out of.  TRH called to admit.   Review of Systems  Constitutional: Negative for chills, diaphoresis and fever.  HENT: Negative for ear discharge and nosebleeds.   Eyes: Negative for blurred vision and photophobia.  Respiratory: Positive for cough. Negative for shortness of breath.   Cardiovascular: Negative for chest pain, palpitations and leg swelling.  Gastrointestinal: Negative for abdominal pain, nausea and vomiting.  Genitourinary: Negative for dysuria and flank pain.  Musculoskeletal: Negative for falls and myalgias.  Skin: Negative for itching and rash.  Neurological: Positive for sensory change, speech change, focal weakness and headaches. Negative for loss of consciousness and weakness.  Psychiatric/Behavioral: Negative for memory loss and suicidal ideas.    Past Medical History:  Diagnosis Date  . Asthma   . Hypertension     Past Surgical History:  Procedure Laterality Date  . FINGER SURGERY       reports that he has been smoking cigarettes.  He has been smoking about 1.00 pack per day. He has never used smokeless tobacco. He reports that he drinks about 3.6 oz of alcohol per week. He reports that he does not use drugs.  No Known Allergies  Family History  Problem Relation Age of Onset  . Hypertension Mother   . Colon cancer Father   . Stomach cancer Brother     Prior to Admission medications   Medication Sig Start Date End Date Taking? Authorizing Provider  amLODipine (NORVASC) 10 MG tablet TAKE 1 TABLET(10 MG) BY MOUTH DAILY 04/06/17  Yes [name], PA  aspirin  325 MG tablet Take 1 tablet (325 mg total) by mouth daily. 12/26/16  Yes Hosie Poisson, MD  aspirin-acetaminophen-caffeine (EXCEDRIN MIGRAINE) [phone] MG tablet Take 2 tablets by mouth every 6 (six) hours as needed for headache.    Yes [provider]  atorvastatin (LIPITOR) 40 MG tablet TAKE 1 TABLET(40 MG)  BY MOUTH DAILY AT 6 PM 04/06/17  Yes Morene Rankins, [place], PA  lisinopril-hydrochlorothiazide (PRINZIDE,ZESTORETIC) 20-12.5 MG tablet TAKE 1 TABLET BY MOUTH DAILY 04/06/17  Yes [name], PA  trolamine salicylate (ASPERCREME) 10 % cream Apply 1 application topically as needed for muscle pain.   Yes [provider]  nicotine (NICODERM CQ - DOSED IN MG/24 HOURS) 14 mg/24hr patch Place 1 patch (14 mg total) onto the skin daily. Patient not taking: Reported on 11/09/2017 01/05/17   [name], PA    Physical Exam:  Constitutional: Elderly male who appears older than stated age in NAD, calm, comfortable Vitals:   11/09/17 1830 11/09/17 1845 11/09/17 1915 11/09/17 1930  BP: (!) 228/104 (!) 228/96 (!) 235/110 (!) 238/124  Pulse: 72 67 70 73  Resp: 14 (!) 21 19 17   Temp:      TempSrc:      SpO2: 100% 99% 100% 100%   Eyes: PERRL, lids and conjunctivae normal ENMT: Mucous membranes are moist. Posterior pharynx clear of any exudate or lesions. Poor dentition.  Neck: normal, supple, no masses, no thyromegaly Respiratory: clear to auscultation bilaterally, no wheezing, no crackles. Normal respiratory effort. No accessory muscle use.   Cardiovascular: Regular rate and rhythm, no murmurs / rubs / gallops. No extremity edema. 2+ pedal pulses. No carotid bruits.  Abdomen: no tenderness, no masses palpated. No hepatosplenomegaly. Bowel sounds positive.  Musculoskeletal: no clubbing / cyanosis. No joint deformity upper and lower extremities. Good ROM, no contractures. Normal muscle tone.  Skin: no rashes, lesions, ulcers. No induration Neurologic: CN 2-12 grossly intact. Sensation intact, DTR normal. Strength 5/5 in all 4.  Psychiatric: Normal judgment and insight. Alert and oriented x 3. Normal mood.     Labs on Admission: I have personally reviewed following labs and imaging studies  CBC: Recent Labs  Lab 11/09/17 1439 11/09/17 1453  WBC 11.9*  --   NEUTROABS 8.0*  --   HGB  16.1 16.7  HCT 48.5 49.0  MCV 94.9  --   PLT 271  --    Basic Metabolic Panel: Recent Labs  Lab 11/09/17 1439 11/09/17 1453  NA 137 137  K 3.9 3.9  CL 103 102  CO2 24  --   GLUCOSE 91 98  BUN 22* 21*  CREATININE 1.39* 1.40*  CALCIUM 9.5  --    GFR: CrCl cannot be calculated (Unknown ideal weight.). Liver Function Tests: Recent Labs  Lab 11/09/17 1439  AST 21  ALT 12*  ALKPHOS 90  BILITOT 0.5  PROT 6.4*  ALBUMIN 2.6*   No results for input(s): LIPASE, AMYLASE in the last 168 hours. No results for input(s): AMMONIA in the last 168 hours. Coagulation Profile: Recent Labs  Lab 11/09/17 1439  INR 0.88   Cardiac Enzymes: No results for input(s): CKTOTAL, CKMB, CKMBINDEX, TROPONINI in the last 168 hours. BNP (last 3 results) No results for input(s): PROBNP in the last 8760 hours. HbA1C: No results for input(s): HGBA1C in the last 72 hours. CBG: Recent Labs  Lab 11/09/17 1814  GLUCAP 76   Lipid Profile: No results for input(s): CHOL, HDL, LDLCALC, TRIG, CHOLHDL, LDLDIRECT in the last 72 hours. Thyroid  Function Tests: No results for input(s): TSH, T4TOTAL, FREET4, T3FREE, THYROIDAB in the last 72 hours. Anemia Panel: No results for input(s): VITAMINB12, FOLATE, FERRITIN, TIBC, IRON, RETICCTPCT in the last 72 hours. Urine analysis:    Component Value Date/Time   COLORURINE STRAW (A) 12/24/2016 2232   APPEARANCEUR CLEAR 12/24/2016 2232   LABSPEC 1.016 12/24/2016 2232   PHURINE 7.0 12/24/2016 2232   GLUCOSEU NEGATIVE 12/24/2016 2232   HGBUR NEGATIVE 12/24/2016 2232   BILIRUBINUR NEGATIVE 12/24/2016 2232   KETONESUR NEGATIVE 12/24/2016 2232   PROTEINUR 100 (A) 12/24/2016 2232   UROBILINOGEN 1.0 03/19/2014 1051   NITRITE NEGATIVE 12/24/2016 2232   LEUKOCYTESUR NEGATIVE 12/24/2016 2232   Sepsis Labs: No results found for this or any previous visit (from the past 240 hour(s)).   Radiological Exams on Admission: Ct Head Wo Contrast  Result Date:  11/09/2017 CLINICAL DATA:  Right-sided weakness. EXAM: CT HEAD WITHOUT CONTRAST TECHNIQUE: Contiguous axial images were obtained from the base of the skull through the vertex without intravenous contrast. COMPARISON:  CT scan of December 24, 2016. FINDINGS: Brain: No evidence of acute infarction, hemorrhage, hydrocephalus, extra-axial collection or mass lesion/mass effect. Vascular: No hyperdense vessel or unexpected calcification. Skull: Normal. Negative for fracture or focal lesion. Sinuses/Orbits: No acute finding. Other: None. IMPRESSION: Normal head CT. Electronically Signed   By: Marijo Conception, M.D.   On: 11/09/2017 15:23    EKG: Independently reviewed. NSR94 bpm with biatrial enlargement  Assessment/Plan Right-sided weakness and numbness, suspect [name] :acute.  Patient presents with intermittent episodes of right-sided numbness and weakness.  Blood pressures noted to be significant elevated on arrival.  Initial CT scan of the brain negative for any acute abnormalities. - Admit to telemetry bed - Stroke order set initiated - Neuro checks - Check  MRI/MRA head w/o contrast - PT/OT/Speech to eval and treat if needed - Check echocardiogram and Vas carotid U/S   - Check Hemoglobin A1c and lipid panel in a.m. - ASA - Consider consult to neurology if MRI positive  Hypertensive urgency: Acute.  On admission initial blood pressure elevated up to 221/114 - Continue amlodipine, lisinopril, and hydrochlorothiazide - Hydralazine IV prn elevated blood pressure  Leukocytosis: Acute. WBC 11.9 on admission.  Patient denies urinary symptoms.  - Check CXR  Chronic kidney disease stage III: Creatinine noted to be 1.4 admission.  Patient's baseline creatinine appears to be around 1.1-1.3. - continue to monitor  Tobacco abuse: Patient reports smoking 2 ppd. - Nicotine patch - Counseled the patient on need of cessation of tobacco abuse    DVT prophylaxis: lovenox  Code Status: Full  Family  Communication: No family present at  Bedside. Disposition Plan: TBD  Consults called: none  Admission status: observation Norval Morton MD [facility] Pager [phone]   If 7PM-7AM, please contact night-coverage [email or web address] Password [facility]  11/09/2017, 7:44 PM

## 2017-11-09 NOTE — ED Provider Notes (Signed)
[facility] Provider Note   CSN: [id number] Arrival date & time: 11/09/17  1416     History   Chief Complaint Chief Complaint  Patient presents with  . Weakness    HPI Albert Harris is a 62 y.o. male with a history of asthma and hypertension who presents to the emergency department with a chief complaint of right-sided numbness.  The patient reports that he had an episode of of right-sided numbness and weakness that lasted for approximately 2 to 3 minutes before completely resolving 1 week ago.  He reports that during that time that he attempted to pick up an object with his right hand, but was unable to hold it.  He reports that today he had 3 episodes of right sided numbness and tingling that lasted for approximately 1 minute each before resolving.  His wife states that she was speaking with him on the phone during 1 of the episodes when she noticed that his speech sound slurred, but resolved when he stated that his other symptoms resolved.  Last episode today at 13:30.  He denies headache, visual changes, chest pain, dyspnea, left-sided numbness or weakness, changes in hearing, dizziness, lightheadedness, or N/V/D.   He also reports that he has been out of his home antihypertensive medications for the last month because he has not scheduled a follow-up appointment with his PCP.  He has been compliant with his home aspirin; last dose this morning.  The history is provided by the patient. No language interpreter was used.  Weakness  Primary symptoms include no dizziness. Pertinent negatives include no shortness of breath, no chest pain, no vomiting, no confusion and no headaches.    Past Medical History:  Diagnosis Date  . Asthma   . Hypertension     Patient Active Problem List   Diagnosis Date Noted  . Right sided weakness 11/09/2017  . CKD (chronic kidney disease), stage III ([place]) 11/09/2017  . Tobacco abuse 01/19/2017  . Hypertensive  urgency 12/24/2016  . [name] (transient ischemic attack) 12/24/2016    Past Surgical History:  Procedure Laterality Date  . FINGER SURGERY          Home Medications    Prior to Admission medications   Medication Sig Start Date End Date Taking? Authorizing Provider  amLODipine (NORVASC) 10 MG tablet TAKE 1 TABLET(10 MG) BY MOUTH DAILY 04/06/17  Yes [name], PA  aspirin 325 MG tablet Take 1 tablet (325 mg total) by mouth daily. 12/26/16  Yes Hosie Poisson, MD  aspirin-acetaminophen-caffeine (EXCEDRIN MIGRAINE) [phone] MG tablet Take 2 tablets by mouth every 6 (six) hours as needed for headache.    Yes [provider]  atorvastatin (LIPITOR) 40 MG tablet TAKE 1 TABLET(40 MG) BY MOUTH DAILY AT 6 PM 04/06/17  Yes Morene Rankins, [place], PA  lisinopril-hydrochlorothiazide (PRINZIDE,ZESTORETIC) 20-12.5 MG tablet TAKE 1 TABLET BY MOUTH DAILY 04/06/17  Yes [name], PA  trolamine salicylate (ASPERCREME) 10 % cream Apply 1 application topically as needed for muscle pain.   Yes [provider]  nicotine (NICODERM CQ - DOSED IN MG/24 HOURS) 14 mg/24hr patch Place 1 patch (14 mg total) onto the skin daily. Patient not taking: Reported on 11/09/2017 01/05/17   [name], PA    Family History Family History  Problem Relation Age of Onset  . Hypertension Mother   . Colon cancer Father   . Stomach cancer Brother     Social History Social History   Tobacco Use  .  Smoking status: Current Every Day Smoker    Packs/day: 1.00    Types: Cigarettes  . Smokeless tobacco: Never Used  Substance Use Topics  . Alcohol use: Yes    Alcohol/week: 3.6 oz    Types: 6 Cans of beer per week  . Drug use: No     Allergies   Patient has no known allergies.   Review of Systems Review of Systems  Constitutional: Negative for appetite change, chills and fever.  HENT: Negative for congestion and sore throat.   Eyes: Negative for visual disturbance.  Respiratory:  Negative for shortness of breath.   Cardiovascular: Negative for chest pain.  Gastrointestinal: Negative for abdominal pain, diarrhea, nausea and vomiting.  Genitourinary: Negative for dysuria and urgency.  Musculoskeletal: Negative for back pain, myalgias, neck pain and neck stiffness.  Skin: Negative for rash.  Allergic/Immunologic: Negative for immunocompromised state.  Neurological: Positive for weakness and numbness. Negative for dizziness, tremors, seizures, syncope and headaches.  Psychiatric/Behavioral: Negative for confusion.   Physical Exam Updated Vital Signs BP (!) 190/151 (BP Location: Right Arm)   Pulse 76   Temp 97.7 F (36.5 C) (Oral)   Resp 18   Ht 5\' 9"  (1.753 m)   Wt 64.3 kg (141 lb 12.1 oz)   SpO2 99%   BMI 20.93 kg/m   Physical Exam  Constitutional: He is oriented to person, place, and time. He appears well-developed. No distress.  HENT:  Head: Normocephalic and atraumatic.  Right Ear: External ear normal.  Left Ear: External ear normal.  Nose: Nose normal.  Mouth/Throat: Oropharynx is clear and moist. No oropharyngeal exudate.  Eyes: Pupils are equal, round, and reactive to light. Conjunctivae and EOM are normal.  Neck: Neck supple.  Cardiovascular: Normal rate, regular rhythm, normal heart sounds and intact distal pulses. Exam reveals no gallop and no friction rub.  No murmur heard. Pulmonary/Chest: Effort normal and breath sounds normal. No stridor. No respiratory distress. He has no wheezes. He has no rales. He exhibits no tenderness.  Abdominal: Soft. He exhibits no distension.  Neurological: He is alert and oriented to person, place, and time. He has normal strength. No cranial nerve deficit or sensory deficit. He displays a negative Romberg sign. Coordination and gait normal. GCS eye subscore is 4. GCS verbal subscore is 5. GCS motor subscore is 6.  Cranial nerves II through XII are grossly intact.  No dysmetria with finger-to-nose bilaterally.  No  pronator drift.  Negative Romberg.  Strength against resistance is 5 out of 5 and symmetric to the bilateral upper and lower extremity's.  Sensation is intact and symmetric throughout.  Speaks in complete, fluent sentences without slurred speech.  Mentation is normal.  Alert and oriented x3.  Skin: Skin is warm and dry. He is not diaphoretic.  Psychiatric: His behavior is normal.  Nursing note and vitals reviewed.    ED Treatments / Results  Labs (all labs ordered are listed, but only abnormal results are displayed) Labs Reviewed  CBC - Abnormal; Notable for the following components:      Result Value   WBC 11.9 (*)    All other components within normal limits  DIFFERENTIAL - Abnormal; Notable for the following components:   Neutro Abs 8.0 (*)    All other components within normal limits  COMPREHENSIVE METABOLIC PANEL - Abnormal; Notable for the following components:   BUN 22 (*)    Creatinine, Ser 1.39 (*)    Total Protein 6.4 (*)    Albumin  2.6 (*)    ALT 12 (*)    GFR calc non Af Amer 52 (*)    All other components within normal limits  I-STAT CHEM 8, ED - Abnormal; Notable for the following components:   BUN 21 (*)    Creatinine, Ser 1.40 (*)    All other components within normal limits  PROTIME-INR  APTT  HEMOGLOBIN A1C  LIPID PANEL  CBC WITH DIFFERENTIAL/PLATELET  BASIC METABOLIC PANEL  RAPID URINE DRUG SCREEN, HOSP PERFORMED  I-STAT TROPONIN, ED  CBG MONITORING, ED    EKG EKG Interpretation  Date/Time:  Tuesday November 09 2017 14:26:08 EDT Ventricular Rate:  94 PR Interval:  134 QRS Duration: 80 QT Interval:  352 QTC Calculation: 440 R Axis:   64 Text Interpretation:  Normal sinus rhythm Biatrial enlargement Left ventricular hypertrophy Nonspecific ST abnormality Abnormal ECG Confirmed by Davonna Belling [phone]) on 11/09/2017 7:01:02 PM   Radiology Ct Head Wo Contrast  Result Date: 11/09/2017 CLINICAL DATA:  Right-sided weakness. EXAM: CT HEAD WITHOUT  CONTRAST TECHNIQUE: Contiguous axial images were obtained from the base of the skull through the vertex without intravenous contrast. COMPARISON:  CT scan of December 24, 2016. FINDINGS: Brain: No evidence of acute infarction, hemorrhage, hydrocephalus, extra-axial collection or mass lesion/mass effect. Vascular: No hyperdense vessel or unexpected calcification. Skull: Normal. Negative for fracture or focal lesion. Sinuses/Orbits: No acute finding. Other: None. IMPRESSION: Normal head CT. Electronically Signed   By: Marijo Conception, M.D.   On: 11/09/2017 15:23   Dg Chest Port 1 View  Result Date: 11/09/2017 CLINICAL DATA:  Cough. EXAM: PORTABLE CHEST 1 VIEW COMPARISON:  12/03/2016 FINDINGS: Mild hyperinflation, similar to priors. Subsegmental atelectasis at the right lung base. Unchanged heart size and mediastinal contours. No focal airspace disease, pleural effusion or pneumothorax. No pulmonary edema. No acute osseous abnormalities. IMPRESSION: Mild chronic hyperinflation. Subsegmental atelectasis at the right lung base. Electronically Signed   By: Jeb Levering M.D.   On: 11/09/2017 20:51    Procedures Procedures (including critical care time)  Medications Ordered in ED Medications  nicotine (NICODERM CQ - dosed in mg/24 hours) patch 21 mg (21 mg Transdermal Patch Applied 11/09/17 1934)  amLODipine (NORVASC) tablet 10 mg (10 mg Oral Given 11/09/17 1934)  lisinopril (PRINIVIL,ZESTRIL) tablet 20 mg (20 mg Oral Given 11/09/17 1941)    And  hydrochlorothiazide (MICROZIDE) capsule 12.5 mg (12.5 mg Oral Given 11/09/17 1941)  aspirin tablet 325 mg (has no administration in time range)   stroke: mapping our early stages of recovery book (has no administration in time range)  acetaminophen (TYLENOL) tablet 650 mg (has no administration in time range)    Or  acetaminophen (TYLENOL) solution 650 mg (has no administration in time range)    Or  acetaminophen (TYLENOL) suppository 650 mg (has no administration in  time range)  enoxaparin (LOVENOX) injection 40 mg (has no administration in time range)  hydrALAZINE (APRESOLINE) injection 10 mg (10 mg Intravenous Given 11/09/17 2023)  atorvastatin (LIPITOR) tablet 40 mg (40 mg Oral Given 11/09/17 1934)     Initial Impression / Assessment and Plan / ED Course  I have reviewed the triage vital signs and the nursing notes.  Pertinent labs & imaging results that were available during my care of the patient were reviewed by me and considered in my medical decision making (see chart for details).     62 year old male with a history of asthma and hypertension who presents to the emergency department with a chief  complaint of right-sided numbness.  He had an episode approximately of hemiparesis to the right side that resolved within 3 minutes.  Today, he had 3 episodes of right-sided numbness and tingling that each resolved after approximately a minute.  Last episode at 1330.  Family noted slurred speech during 1 of the episodes.  The patient has been compliant with his home ASA, but has been out of his home antihypertensive medications for the last month because he has not scheduled a follow-up appointment with his PCP.    Home medications given in the ED.  CT head is unremarkable.  Labs are notable for mild leukocytosis of 12, but are otherwise unremarkable.  Differential diagnosis includes [name] versus hypertensive emergency.   Consulted the hospitalist team and spoke with Dr. Tamala Julian who will admit the patient. The patient appears reasonably stabilized for admission considering the current resources, flow, and capabilities available in the ED at this time, and I doubt any other [facility] requiring further screening and/or treatment in the ED prior to admission.  Final Clinical Impressions(s) / ED Diagnoses   Final diagnoses:  [name] (transient ischemic attack)    ED Discharge Orders    None       Joanne Gavel, PA-C 11/09/17 2110    Davonna Belling,  MD 11/09/17 2226

## 2017-11-09 NOTE — ED Notes (Addendum)
Pt was able to walk back to Pod E, pt was steady on his feet no complaints.

## 2017-11-09 NOTE — ED Notes (Signed)
Pt stated he wanted to walk around outside. Informed that he may be skipped, he stated 'fresh air will make me feel better.'

## 2017-11-09 NOTE — ED Triage Notes (Addendum)
Rt sided weakness x 1 week  That comes and goes from face to toes has happened x 3 today  Better now, hx of TIAS Lucianne Lei is neg

## 2017-11-09 NOTE — ED Provider Notes (Signed)
Patient placed in Quick Look pathway, seen and evaluated   Chief Complaint: weakness right side  HPI:   Albert Harris is a 62 y.o. male with hx of HTN and is out of his medication and every day smoker who presents to the ED with Rt sided weakness x 1 week that comes and goes. Patient reports that when it happens it goes from face to toes has happened x 3 today  Better now, hx of TIAS.  ROS: Neuro: right side weakness that has resolved  Physical Exam:  BP (!) 193/103 (BP Location: Right Arm)   Pulse 97   Temp 97.9 F (36.6 C) (Oral)   Resp (!) 22   SpO2 99%    Gen: No distress  Neuro: Awake and Alert, grips are equal, no pronator drift, no facial weakness, no slurred speech.   Skin: Warm and dry       Initiation of care has begun. The patient has been counseled on the process, plan, and necessity for staying for the completion/evaluation, and the remainder of the medical screening examination    Ashley Murrain, NP 11/09/17 [place]    Carmin Muskrat, MD 11/12/17 2342

## 2017-11-09 NOTE — ED Notes (Signed)
Admitting MD at bedside.

## 2017-11-09 NOTE — Progress Notes (Signed)
Received from ED via stretcher.  Oriented to room, unit, safety precautions, meds & plan of care.  Stroke education initiated with patient & family.  BP remains elevated, discussed med & relaxation techniques.

## 2017-11-10 ENCOUNTER — Other Ambulatory Visit: Payer: Self-pay

## 2017-11-10 ENCOUNTER — Observation Stay (HOSPITAL_BASED_OUTPATIENT_CLINIC_OR_DEPARTMENT_OTHER): Payer: Self-pay

## 2017-11-10 ENCOUNTER — Observation Stay (HOSPITAL_COMMUNITY): Payer: Self-pay

## 2017-11-10 DIAGNOSIS — I5030 Unspecified diastolic (congestive) heart failure: Secondary | ICD-10-CM

## 2017-11-10 DIAGNOSIS — G459 Transient cerebral ischemic attack, unspecified: Secondary | ICD-10-CM

## 2017-11-10 LAB — CBC WITH DIFFERENTIAL/PLATELET
Abs Immature Granulocytes: 0 10*3/uL (ref 0.0–0.1)
BASOS ABS: 0.1 10*3/uL (ref 0.0–0.1)
Basophils Relative: 1 %
EOS ABS: 0.4 10*3/uL (ref 0.0–0.7)
EOS PCT: 5 %
HCT: 48.3 % (ref 39.0–52.0)
Hemoglobin: 16.1 g/dL (ref 13.0–17.0)
Immature Granulocytes: 0 %
Lymphocytes Relative: 29 %
Lymphs Abs: 2.4 10*3/uL (ref 0.7–4.0)
MCH: 31.4 pg (ref 26.0–34.0)
MCHC: 33.3 g/dL (ref 30.0–36.0)
MCV: 94.3 fL (ref 78.0–100.0)
MONO ABS: 0.5 10*3/uL (ref 0.1–1.0)
Monocytes Relative: 6 %
Neutro Abs: 5 10*3/uL (ref 1.7–7.7)
Neutrophils Relative %: 59 %
Platelets: 254 10*3/uL (ref 150–400)
RBC: 5.12 MIL/uL (ref 4.22–5.81)
RDW: 13.2 % (ref 11.5–15.5)
WBC: 8.4 10*3/uL (ref 4.0–10.5)

## 2017-11-10 LAB — LIPID PANEL
CHOLESTEROL: 252 mg/dL — AB (ref 0–200)
HDL: 46 mg/dL (ref >40)
LDL CALC: 167 mg/dL — AB (ref 0–99)
TRIGLYCERIDES: 197 mg/dL — AB (ref <150)
Total CHOL/HDL Ratio: 5.5 RATIO
VLDL: 39 mg/dL (ref 0–40)

## 2017-11-10 LAB — BASIC METABOLIC PANEL
Anion gap: 10 (ref 5–15)
BUN: 21 mg/dL — AB (ref 6–20)
CALCIUM: 9.7 mg/dL (ref 8.9–10.3)
CO2: 26 mmol/L (ref 22–32)
CREATININE: 1.39 mg/dL — AB (ref 0.61–1.24)
Chloride: 105 mmol/L (ref 101–111)
GFR calc Af Amer: >60 mL/min (ref >60)
GFR calc non Af Amer: 52 mL/min — ABNORMAL LOW (ref >60)
Glucose, Bld: 93 mg/dL (ref 65–99)
Potassium: 4.3 mmol/L (ref 3.5–5.1)
Sodium: 141 mmol/L (ref 135–145)

## 2017-11-10 LAB — ECHOCARDIOGRAM COMPLETE
HEIGHTINCHES: 69 in
WEIGHTICAEL: 2268.09 [oz_av]

## 2017-11-10 LAB — HEMOGLOBIN A1C
Hgb A1c MFr Bld: 5.4 % (ref 4.8–5.6)
MEAN PLASMA GLUCOSE: 108.28 mg/dL

## 2017-11-10 MED ORDER — NICOTINE 21 MG/24HR TD PT24
21.0000 mg | MEDICATED_PATCH | Freq: Every day | TRANSDERMAL | Status: DC
  Administered 2017-11-10 – 2017-11-11 (×2): 21 mg via TRANSDERMAL
  Filled 2017-11-10 (×2): qty 1

## 2017-11-10 MED ORDER — ATORVASTATIN CALCIUM 40 MG PO TABS
40.0000 mg | ORAL_TABLET | Freq: Every day | ORAL | Status: DC
  Administered 2017-11-10 – 2017-11-11 (×2): 40 mg via ORAL
  Filled 2017-11-10 (×2): qty 1

## 2017-11-10 NOTE — Progress Notes (Signed)
  Echocardiogram 2D Echocardiogram has been performed.  Harish Bram L Androw 11/10/2017, 2:53 PM

## 2017-11-10 NOTE — Progress Notes (Signed)
SLP Cancellation Note  Patient Details Name: MARICO BUCKLE MRN: [id number] DOB: 29-Jan-1956   Cancelled treatment:       Reason Eval/Treat Not Completed: SLP screened, no needs identified, will sign off   Juan Quam Laurice 11/10/2017, 10:26 AM

## 2017-11-10 NOTE — Care Management Note (Signed)
Case Management Note  Patient Details  Name: Albert Harris MRN: [id number] Date of Birth: 06/09/1955  Subjective/Objective:      Pt in with rt sided weakness. He is from home with spouse. Pt has no insurance. Was going to [place] for PCP but unable to afford since insurance stopped last year.             Action/Plan: CM consulted for PCP/ medication assistance. CM met with the patient and his daughter and they would like to attend one of the [facility]. CM was able to obtain him an appointment at [place] [facility]. Information on the AVS. CM also informed them of use of [facility] pharmacy for his medication needs.  CM following for further d/c needs.   Expected Discharge Date:  11/11/17               Expected Discharge Plan:  Home/Self Care  In-House Referral:     Discharge planning Services  CM Consult, [facility], Medication Assistance  Post Acute Care Choice:    Choice offered to:     DME Arranged:    DME Agency:     HH Arranged:    HH Agency:     Status of Service:  In process, will continue to follow  If discussed at Long Length of Stay Meetings, dates discussed:    Additional Comments:  Pollie Friar, RN 11/10/2017, 12:14 PM

## 2017-11-10 NOTE — Progress Notes (Signed)
Physical Therapy Evaluation Patient Details Name: Albert Harris MRN: [id number] DOB: Oct 26, 1955 Today's Date: 11/10/2017   History of Present Illness  Pt is a 62 y.o. male presenting with intermittent R sided numbness and weakness. MRI on 6/5 negative for acute infarct. PMHx: HTN, [name], asthma.  Clinical Impression  Albert Harris is a pleasant 61 y/o male admitted with the above listed diagnosis. Prior to admission, patient reports independence with all functional mobility. Patient today only reporting "grogginess" and slight balance deficits. Able to transfer with Mod I and ambulate in hallway and up/down stairs with Min Guard/Sup for safety. Slight lateral lean with turning requiring Min guard. PT to plan to continue to follow acutely to maximize mobility and balance prior to discharge.     Follow Up Recommendations No PT follow up    Equipment Recommendations  None recommended by PT    Recommendations for Other Services       Precautions / Restrictions Precautions Precautions: Fall Restrictions Weight Bearing Restrictions: No      Mobility  Bed Mobility               General bed mobility comments: Patient sitting EOB upon PT arrival - able to demonstrate good dynamic sitting balance  Transfers Overall transfer level: Modified independent Equipment used: None                Ambulation/Gait Ambulation/Gait assistance: Min guard;Supervision Ambulation Distance (Feet): 200 Feet Assistive device: None Gait Pattern/deviations: Step-through pattern;Decreased stride length;Drifts right/left     General Gait Details: slight lateral sway with turning, drifts with reduced gait speed with horizontal and vertical head turns.  Stairs            Wheelchair Mobility    Modified Rankin (Stroke Patients Only)       Balance Overall balance assessment: Mild deficits observed, not formally tested                                            Pertinent Vitals/Pain Pain Assessment: No/denies pain    Home Living Family/patient expects to be discharged to:: Private residence Living Arrangements: Spouse/significant other Available Help at Discharge: Family;Available 24 hours/day Type of Home: House Home Access: Stairs to enter Entrance Stairs-Rails: Right Entrance Stairs-Number of Steps: ("a couple") Home Layout: Two level;Able to live on main level with bedroom/bathroom Home Equipment: None      Prior Function Level of Independence: Independent               Hand Dominance   Dominant Hand: Right    Extremity/Trunk Assessment   Upper Extremity Assessment Upper Extremity Assessment: Defer to OT evaluation    Lower Extremity Assessment Lower Extremity Assessment: Overall WFL for tasks assessed       Communication   Communication: No difficulties  Cognition Arousal/Alertness: Awake/alert Behavior During Therapy: WFL for tasks assessed/performed Overall Cognitive Status: Within Functional Limits for tasks assessed                                        General Comments      Exercises     Assessment/Plan    PT Assessment Patient needs continued PT services  PT Problem List Decreased balance;Decreased mobility;Decreased safety awareness       PT Treatment Interventions  Functional mobility training;Therapeutic activities;Therapeutic exercise;Balance training;Neuromuscular re-education;Patient/family education;Gait training    PT Goals (Current goals can be found in the Care Plan section)  Acute Rehab PT Goals Patient Stated Goal: return home PT Goal Formulation: With patient Time For Goal Achievement: 11/24/17 Potential to Achieve Goals: Good    Frequency Min 4X/week   Barriers to discharge        Co-evaluation               AM-PAC PT "6 Clicks" Daily Activity  Outcome Measure Difficulty turning over in bed (including adjusting bedclothes, sheets and  blankets)?: None Difficulty moving from lying on back to sitting on the side of the bed? : None Difficulty sitting down on and standing up from a chair with arms (e.g., wheelchair, bedside commode, etc,.)?: None Help needed moving to and from a bed to chair (including a wheelchair)?: A Little Help needed walking in hospital room?: A Little Help needed climbing 3-5 steps with a railing? : A Little 6 Click Score: 21    End of Session Equipment Utilized During Treatment: Gait belt Activity Tolerance: Patient tolerated treatment well Patient left: in bed;with call bell/phone within reach;with family/visitor present Nurse Communication: Mobility status PT Visit Diagnosis: Unsteadiness on feet (R26.81);Other abnormalities of gait and mobility (R26.89)    Time: 8676-7209 PT Time Calculation (min) (ACUTE ONLY): 26 min   Charges:   PT Evaluation $PT Eval Moderate Complexity: 1 Mod     PT G Codes:        Lanney Gins, PT, DPT 11/10/17 9:54 AM

## 2017-11-10 NOTE — Progress Notes (Signed)
Carotid artery duplex has been completed. 1-39% ICA stenosis bilaterally.  11/10/17 3:37 PM Albert Harris RVT

## 2017-11-10 NOTE — Progress Notes (Signed)
Progress Note    Albert Harris  JOA:[id number] DOB: 1955-08-23  DOA: 11/09/2017 PCP: [name], PA    Brief Narrative:    Medical records reviewed and are as summarized below:  Albert Harris is an 62 y.o. male with medical history significant of HTN, [name], asthma, and tobacco abuse; who presents with complaints of intermittent right-sided numbness and weakness.  Symptoms initially started 3 weeks ago patient reported with tingling numbness sensation down  his right upper and lower extremity.  Symptoms lasted several minutes and then self resolved.  Reports similar episode occurring 1 week ago, and then 3 times today.  Symptoms waxed and waned in severity.  He was unable to grab onto anything and reported stumbling around his home.  Associated symptoms included mild headache, reports of slurred speech by wife, and mild intermittent cough that he reports is chronic.  Denies having any falls, nausea, vomiting, change of vision, chest pain, palpitations, or shortness of breath.    Assessment/Plan:   Principal Problem:   Right sided weakness Active Problems:   Hypertensive urgency   [name] (transient ischemic attack)   Tobacco abuse   CKD (chronic kidney disease), stage III (HCC)  Right-sided weakness and numbness, suspect [name] vs HTN  - Patient presents with intermittent episodes of right-sided numbness and weakness.  Blood pressures noted to be significant elevated on arrival.  Initial CT scan of the brain negative for any acute abnormalities. -  MRI/MRA head w/o contrast negative for CVA -  echocardiogram and Vas carotid U/S pending - LDL: 167-- added statin HgbA1c: 5.4 - ASA 325 mg- was not taking at home -had similar episode on July 2018   Hypertensive urgency: Acute.  On admission initial blood pressure elevated up to 221/114 - Continue amlodipine, lisinopril - Hydralazine IV prn elevated blood pressure -was not taking any meds at home  Leukocytosis:   -resolved  Chronic kidney disease stage III:  -Creatinine noted to be 1.4 admission.  Patient's baseline creatinine appears to be around 1.1-1.3. - outpatient follow up  Tobacco abuse: Patient reports smoking 2 ppd. - Nicotine patch - Counseled the patient on need of cessation of tobacco due to increased stroke risk    Body mass index is 20.93 kg/m.   Family Communication/Anticipated D/C date and plan/Code Status   DVT prophylaxis: Lovenox ordered. Code Status: Full Code.  Family Communication:  Disposition Plan:    Medical Consultants:    None.    Subjective:   Has not been taking medications from home-- no ASA  Objective:    Vitals:   11/10/17 0300 11/10/17 0546 11/10/17 0900 11/10/17 1121  BP: 124/73 (!) 117/101 (!) 163/102 (!) 154/96  Pulse: 68 68 96 79  Resp: 18 18 20 16   Temp: 98 F (36.7 C) 98.1 F (36.7 C) 97.6 F (36.4 C) (!) 97.5 F (36.4 C)  TempSrc: Oral Oral Oral Oral  SpO2: 99% 96% 100% 100%  Weight:      Height:        Intake/Output Summary (Last 24 hours) at 11/10/2017 1423 Last data filed at 11/09/2017 2104 Gross per 24 hour  Intake -  Output 250 ml  Net -250 ml   Filed Weights   11/09/17 2102  Weight: 64.3 kg (141 lb 12.1 oz)    Exam: In bed, NAD No focal neuro deficit rrr No increased work of breathing  Data Reviewed:   I have personally reviewed following labs and imaging studies:  Labs: Labs show  the following:   Basic Metabolic Panel: Recent Labs  Lab 11/09/17 1439 11/09/17 1453 11/10/17 0721  NA 137 137 141  K 3.9 3.9 4.3  CL 103 102 105  CO2 24  --  26  GLUCOSE 91 98 93  BUN 22* 21* 21*  CREATININE 1.39* 1.40* 1.39*  CALCIUM 9.5  --  9.7   GFR Estimated Creatinine Clearance: 49.5 mL/min (A) (by C-G formula based on SCr of 1.39 mg/dL (H)). Liver Function Tests: Recent Labs  Lab 11/09/17 1439  AST 21  ALT 12*  ALKPHOS 90  BILITOT 0.5  PROT 6.4*  ALBUMIN 2.6*   No results for input(s):  LIPASE, AMYLASE in the last 168 hours. No results for input(s): AMMONIA in the last 168 hours. Coagulation profile Recent Labs  Lab 11/09/17 1439  INR 0.88    CBC: Recent Labs  Lab 11/09/17 1439 11/09/17 1453 11/10/17 0721  WBC 11.9*  --  8.4  NEUTROABS 8.0*  --  5.0  HGB 16.1 16.7 16.1  HCT 48.5 49.0 48.3  MCV 94.9  --  94.3  PLT 271  --  254   Cardiac Enzymes: No results for input(s): CKTOTAL, CKMB, CKMBINDEX, TROPONINI in the last 168 hours. BNP (last 3 results) No results for input(s): PROBNP in the last 8760 hours. CBG: Recent Labs  Lab 11/09/17 1814  GLUCAP 76   D-Dimer: No results for input(s): DDIMER in the last 72 hours. Hgb A1c: Recent Labs    11/10/17 0721  HGBA1C 5.4   Lipid Profile: Recent Labs    11/10/17 0721  CHOL 252*  HDL 46  LDLCALC 167*  TRIG 197*  CHOLHDL 5.5   Thyroid function studies: No results for input(s): TSH, T4TOTAL, T3FREE, THYROIDAB in the last 72 hours.  Invalid input(s): FREET3 Anemia work up: No results for input(s): VITAMINB12, FOLATE, FERRITIN, TIBC, IRON, RETICCTPCT in the last 72 hours. Sepsis Labs: Recent Labs  Lab 11/09/17 1439 11/10/17 0721  WBC 11.9* 8.4    Microbiology No results found for this or any previous visit (from the past 240 hour(s)).  Procedures and diagnostic studies:  Ct Head Wo Contrast  Result Date: 11/09/2017 CLINICAL DATA:  Right-sided weakness. EXAM: CT HEAD WITHOUT CONTRAST TECHNIQUE: Contiguous axial images were obtained from the base of the skull through the vertex without intravenous contrast. COMPARISON:  CT scan of December 24, 2016. FINDINGS: Brain: No evidence of acute infarction, hemorrhage, hydrocephalus, extra-axial collection or mass lesion/mass effect. Vascular: No hyperdense vessel or unexpected calcification. Skull: Normal. Negative for fracture or focal lesion. Sinuses/Orbits: No acute finding. Other: None. IMPRESSION: Normal head CT. Electronically Signed   By: Marijo Conception, M.D.   On: 11/09/2017 15:23   Mr Brain Wo Contrast  Result Date: 11/10/2017 CLINICAL DATA:  Right-sided numbness and weakness EXAM: MRI HEAD WITHOUT CONTRAST MRA HEAD WITHOUT CONTRAST TECHNIQUE: Multiplanar, multiecho pulse sequences of the brain and surrounding structures were obtained without intravenous contrast. Angiographic images of the head were obtained using MRA technique without contrast. COMPARISON:  Head CT 11/09/2017 Brain MRI 12/25/2016 FINDINGS: MRI HEAD FINDINGS Brain: The midline structures are normal. No focal diffusion restriction to indicate acute infarct. No intraparenchymal hemorrhage. Unchanged multifocal white matter hyperintensity. This is nonspecific but most commonly seen in the setting of chronic small vessel ischemia. No mass lesion. No chronic microhemorrhage or cerebral amyloid angiopathy. No hydrocephalus, age advanced atrophy or lobar predominant volume loss. No dural abnormality or extra-axial collection. Skull and upper cervical spine: The visualized  skull base, calvarium, upper cervical spine and extracranial soft tissues are normal. Sinuses/Orbits: No fluid levels or advanced mucosal thickening. No mastoid effusion. Normal orbits. MRA HEAD FINDINGS Intracranial internal carotid arteries: Normal. Anterior cerebral arteries: Normal. Middle cerebral arteries: Normal. Posterior communicating arteries: Present on the right. Posterior cerebral arteries: Normal. Basilar artery: Normal. Vertebral arteries: Left dominant. Normal. Superior cerebellar arteries: Normal. Anterior inferior cerebellar arteries: Normal. Posterior inferior cerebellar arteries: Normal. IMPRESSION: 1. No acute intracranial abnormality. 2. No emergent large vessel occlusion or hemodynamically significant intracranial stenosis. 3. Unchanged appearance of multifocal white matter hyperintensity, nonspecific, but most commonly chronic small vessel ischemia. Electronically Signed   By: Ulyses Jarred M.D.   On:  11/10/2017 02:15   Dg Chest Port 1 View  Result Date: 11/09/2017 CLINICAL DATA:  Cough. EXAM: PORTABLE CHEST 1 VIEW COMPARISON:  12/03/2016 FINDINGS: Mild hyperinflation, similar to priors. Subsegmental atelectasis at the right lung base. Unchanged heart size and mediastinal contours. No focal airspace disease, pleural effusion or pneumothorax. No pulmonary edema. No acute osseous abnormalities. IMPRESSION: Mild chronic hyperinflation. Subsegmental atelectasis at the right lung base. Electronically Signed   By: Jeb Levering M.D.   On: 11/09/2017 20:51   Mr Jodene Nam Head Wo Contrast  Result Date: 11/10/2017 CLINICAL DATA:  Right-sided numbness and weakness EXAM: MRI HEAD WITHOUT CONTRAST MRA HEAD WITHOUT CONTRAST TECHNIQUE: Multiplanar, multiecho pulse sequences of the brain and surrounding structures were obtained without intravenous contrast. Angiographic images of the head were obtained using MRA technique without contrast. COMPARISON:  Head CT 11/09/2017 Brain MRI 12/25/2016 FINDINGS: MRI HEAD FINDINGS Brain: The midline structures are normal. No focal diffusion restriction to indicate acute infarct. No intraparenchymal hemorrhage. Unchanged multifocal white matter hyperintensity. This is nonspecific but most commonly seen in the setting of chronic small vessel ischemia. No mass lesion. No chronic microhemorrhage or cerebral amyloid angiopathy. No hydrocephalus, age advanced atrophy or lobar predominant volume loss. No dural abnormality or extra-axial collection. Skull and upper cervical spine: The visualized skull base, calvarium, upper cervical spine and extracranial soft tissues are normal. Sinuses/Orbits: No fluid levels or advanced mucosal thickening. No mastoid effusion. Normal orbits. MRA HEAD FINDINGS Intracranial internal carotid arteries: Normal. Anterior cerebral arteries: Normal. Middle cerebral arteries: Normal. Posterior communicating arteries: Present on the right. Posterior cerebral arteries:  Normal. Basilar artery: Normal. Vertebral arteries: Left dominant. Normal. Superior cerebellar arteries: Normal. Anterior inferior cerebellar arteries: Normal. Posterior inferior cerebellar arteries: Normal. IMPRESSION: 1. No acute intracranial abnormality. 2. No emergent large vessel occlusion or hemodynamically significant intracranial stenosis. 3. Unchanged appearance of multifocal white matter hyperintensity, nonspecific, but most commonly chronic small vessel ischemia. Electronically Signed   By: Ulyses Jarred M.D.   On: 11/10/2017 02:15    Medications:   . amLODipine  10 mg Oral Daily  . aspirin  325 mg Oral Daily  . atorvastatin  40 mg Oral q1800  . enoxaparin (LOVENOX) injection  40 mg Subcutaneous Q24H  . nicotine  21 mg Transdermal Once  . nicotine  21 mg Transdermal Daily   Continuous Infusions:   LOS: 0 days   Geradine Girt  [facility]   *Please refer to [email or web address], password [email or web address] to get updated schedule on who will round on this patient, as hospitalists switch teams weekly. If 7PM-7AM, please contact night-coverage at [email or web address], password [email or web address] for any overnight needs.  11/10/2017, 2:23 PM

## 2017-11-10 NOTE — Evaluation (Signed)
Occupational Therapy Evaluation and Discharge Patient Details Name: Albert Harris MRN: [id number] DOB: May 15, 1956 Today's Date: 11/10/2017    History of Present Illness Pt is a 62 y.o. male presenting with intermittent R sided numbness and weakness. MRI on 6/5 negative for acute infarct. PMHx: HTN, [name], asthma.   Clinical Impression   Pt reports he was independent with ADL PTA. Currently pt at his functional baseline with ADL and functional mobility. Pt reports all deficits have resolved at this time. Educated pt on signs/symptoms of CVA; pt verbalized understanding. Pt planning to d/c home with supervision from family. No further acute OT needs identified; signing off at this time. Please re-consult if needs change. Thank you for this referral.    Follow Up Recommendations  No OT follow up;Supervision - Intermittent    Equipment Recommendations  None recommended by OT    Recommendations for Other Services       Precautions / Restrictions Precautions Precautions: None Restrictions Weight Bearing Restrictions: No      Mobility Bed Mobility               General bed mobility comments: Pt sitting EOB upon arrival  Transfers Overall transfer level: Modified independent Equipment used: None                  Balance Overall balance assessment: Mild deficits observed, not formally tested                                         ADL either performed or assessed with clinical judgement   ADL Overall ADL's : Modified independent;At baseline                                       General ADL Comments: Pt able to perform ADL and higher level balance tasks without difficulty. Educated pt on signs and symptoms of CVA; pt verbalized understanding     Vision Baseline Vision/History: Wears glasses Wears Glasses: Reading only Patient Visual Report: No change from baseline Vision Assessment?: No apparent visual deficits     Perception      Praxis      Pertinent Vitals/Pain Pain Assessment: No/denies pain     Hand Dominance Right   Extremity/Trunk Assessment Upper Extremity Assessment Upper Extremity Assessment: Overall WFL for tasks assessed   Lower Extremity Assessment Lower Extremity Assessment: Defer to PT evaluation       Communication Communication Communication: No difficulties   Cognition Arousal/Alertness: Awake/alert Behavior During Therapy: WFL for tasks assessed/performed Overall Cognitive Status: Within Functional Limits for tasks assessed                                     General Comments       Exercises     Shoulder Instructions      Home Living Family/patient expects to be discharged to:: Private residence Living Arrangements: Spouse/significant other Available Help at Discharge: Family;Available 24 hours/day Type of Home: House       Home Layout: Two level;Able to live on main level with bedroom/bathroom     Bathroom Shower/Tub: Tub/shower unit;Curtain   Bathroom Toilet: Standard     Home Equipment: None  Prior Functioning/Environment Level of Independence: Independent                 OT Problem List:        OT Treatment/Interventions:      OT Goals(Current goals can be found in the care plan section) Acute Rehab OT Goals Patient Stated Goal: return home OT Goal Formulation: All assessment and education complete, DC therapy  OT Frequency:     Barriers to D/C:            Co-evaluation              AM-PAC PT "6 Clicks" Daily Activity     Outcome Measure Help from another person eating meals?: None Help from another person taking care of personal grooming?: None Help from another person toileting, which includes using toliet, bedpan, or urinal?: None Help from another person bathing (including washing, rinsing, drying)?: None Help from another person to put on and taking off regular upper body clothing?: None Help  from another person to put on and taking off regular lower body clothing?: None 6 Click Score: 24   End of Session    Activity Tolerance: Patient tolerated treatment well Patient left: Other (comment)(sitting EOB)  OT Visit Diagnosis: Muscle weakness (generalized) (M62.81)                Time: 4599-7741 OT Time Calculation (min): 14 min Charges:  OT General Charges $OT Visit: 1 Visit OT Evaluation $OT Eval Low Complexity: 1 Low G-Codes:     Emerlyn Mehlhoff A. Ulice Brilliant, M.S., OTR/L Acute Rehab Department: [phone]  Binnie Kand 11/10/2017, 9:26 AM

## 2017-11-11 DIAGNOSIS — R531 Weakness: Secondary | ICD-10-CM

## 2017-11-11 DIAGNOSIS — G459 Transient cerebral ischemic attack, unspecified: Secondary | ICD-10-CM

## 2017-11-11 DIAGNOSIS — I160 Hypertensive urgency: Secondary | ICD-10-CM

## 2017-11-11 DIAGNOSIS — N183 Chronic kidney disease, stage 3 (moderate): Secondary | ICD-10-CM

## 2017-11-11 DIAGNOSIS — I253 Aneurysm of heart: Secondary | ICD-10-CM

## 2017-11-11 DIAGNOSIS — Z720 Tobacco use: Secondary | ICD-10-CM

## 2017-11-11 MED ORDER — HYDRALAZINE HCL 10 MG PO TABS
10.0000 mg | ORAL_TABLET | Freq: Three times a day (TID) | ORAL | Status: DC
  Filled 2017-11-11: qty 1

## 2017-11-11 MED ORDER — METOPROLOL TARTRATE 25 MG PO TABS: 25.0000 mg | ORAL_TABLET | Freq: Two times a day (BID) | ORAL | 0 refills | Status: DC

## 2017-11-11 MED ORDER — ASPIRIN 325 MG PO TABS: 325.0000 mg | ORAL_TABLET | Freq: Every day | ORAL | 1 refills | Status: DC

## 2017-11-11 MED ORDER — CARVEDILOL 6.25 MG PO TABS: 6.2500 mg | ORAL_TABLET | Freq: Two times a day (BID) | ORAL | Status: DC

## 2017-11-11 MED ORDER — METOPROLOL TARTRATE 12.5 MG HALF TABLET: 12.5000 mg | ORAL_TABLET | Freq: Two times a day (BID) | ORAL | Status: DC

## 2017-11-11 MED ORDER — METOPROLOL TARTRATE 25 MG PO TABS
25.0000 mg | ORAL_TABLET | Freq: Two times a day (BID) | ORAL | Status: DC
  Administered 2017-11-11: 25 mg via ORAL
  Filled 2017-11-11: qty 1

## 2017-11-11 MED ORDER — AMLODIPINE BESYLATE 10 MG PO TABS: 10.0000 mg | ORAL_TABLET | Freq: Every day | ORAL | 0 refills | Status: DC

## 2017-11-11 MED ORDER — NICOTINE 14 MG/24HR TD PT24: 14.0000 mg | MEDICATED_PATCH | Freq: Every day | TRANSDERMAL | 0 refills | Status: DC

## 2017-11-11 MED ORDER — ATORVASTATIN CALCIUM 40 MG PO TABS: 40.0000 mg | ORAL_TABLET | Freq: Every day | ORAL | 2 refills | Status: DC

## 2017-11-11 NOTE — Consult Note (Addendum)
Cardiology Consult    [name] MRN: [id number], DOB/AGE: [birth year]   Admit date: 11/09/2017 Date of Consult: 11/11/2017  Primary Physician: [name], [place] Primary Cardiologist: New Requesting Provider: Dr. Karleen Hampshire  Reason for Consultation: PFO and IAS  Albert EWALT is a 62 y.o. male who is being seen today for the evaluation of PFO and IAS at the request of Dr. Karleen Hampshire.   Patient Profile    62 year old male with past medical history of hypertension, and tobacco use who presented with right-sided weakness and slurred speech.  Found to have PFO on echocardiogram.  Past Medical History   Past Medical History:  Diagnosis Date  . Asthma   . Hypertension     Past Surgical History:  Procedure Laterality Date  . FINGER SURGERY       Allergies  No Known Allergies  History of Present Illness    Albert. Harris is a 62 year old male with past medical history of hypertension and tobacco abuse.  He does not attend a PCP office on a regular basis.  Currently works Tree surgeon.  States for the past 3 weeks he developed intermittent numbness and tingling in his right upper and lower extremity.  States his last several minutes at a time and then resolved.  Symptoms seem to increase in frequency over the past week.  Wife reports several days prior to admission she noticed his speech was slurred at times.  He also complained of mild headache and difficulty grasping things with his right hand, as well as stumbling around his home.  States he had previously been on blood pressure medications but is been out of them for some time.  They recently lost her insurance, and were unable to afford seeing a PCP on a regular basis.  He presented to the ER on 6/4 symptoms.  Labs showed stable electrolytes, creatinine 1.4, WBC 11.9, hemoglobin 16.  UDS was negative.  CT head negative, with follow-up MRI negative.  Had echocardiogram done which noted an aneurysmal IAS and PFO present.   Cardiology was consulted in regards to this finding.  Inpatient Medications    . amLODipine  10 mg Oral Daily  . aspirin  325 mg Oral Daily  . atorvastatin  40 mg Oral q1800  . enoxaparin (LOVENOX) injection  40 mg Subcutaneous Q24H  . nicotine  21 mg Transdermal Daily    Family History    Family History  Problem Relation Age of Onset  . Hypertension Mother   . Colon cancer Father   . Stomach cancer Brother     Social History    Social History   Socioeconomic History  . Marital status: Divorced    Spouse name: Not on file  . Number of children: Not on file  . Years of education: Not on file  . Highest education level: Not on file  Occupational History  . Not on file  Social Needs  . Financial resource strain: Not on file  . Food insecurity:    Worry: Not on file    Inability: Not on file  . Transportation needs:    Medical: Not on file    Non-medical: Not on file  Tobacco Use  . Smoking status: Current Every Day Smoker    Packs/day: 1.00    Types: Cigarettes  . Smokeless tobacco: Never Used  Substance and Sexual Activity  . Alcohol use: Yes    Alcohol/week: 3.6 oz    Types: 6 Cans of beer per week  .  Drug use: No  . Sexual activity: Never  Lifestyle  . Physical activity:    Days per week: Not on file    Minutes per session: Not on file  . Stress: Not on file  Relationships  . Social connections:    Talks on phone: Not on file    Gets together: Not on file    Attends religious service: Not on file    Active member of club or organization: Not on file    Attends meetings of clubs or organizations: Not on file    Relationship status: Not on file  . Intimate partner violence:    Fear of current or ex partner: Not on file    Emotionally abused: Not on file    Physically abused: Not on file    Forced sexual activity: Not on file  Other Topics Concern  . Not on file  Social History Narrative   Married   Works in Audiological scientist      Review of Systems    All other systems reviewed and are otherwise negative except as noted above.  Physical Exam    Blood pressure (!) 174/97, pulse 79, temperature 97.8 F (36.6 C), temperature source Oral, resp. rate 16, height 5\' 9"  (1.753 m), weight 141 lb 12.1 oz (64.3 kg), SpO2 99 %.  General: Pleasant, older white male NAD Psych: Normal affect. Neuro: Alert and oriented X 3. Moves all extremities spontaneously. HEENT: Normal  Neck: Supple, no JVD. Lungs:  Resp regular and unlabored, CTA. Heart: RRR no s3, s4, or murmurs. Abdomen: Soft, non-tender, non-distended, BS + x 4.  Extremities: No clubbing, cyanosis or edema. DP/PT/Radials 2+ and equal bilaterally.  Labs    Troponin [facility] Test) Recent Labs    11/09/17 1452  TROPIPOC 0.03   No results for input(s): CKTOTAL, CKMB, TROPONINI in the last 72 hours. Lab Results  Component Value Date   WBC 8.4 11/10/2017   HGB 16.1 11/10/2017   HCT 48.3 11/10/2017   MCV 94.3 11/10/2017   PLT 254 11/10/2017    Recent Labs  Lab 11/09/17 1439  11/10/17 0721  NA 137   < > 141  K 3.9   < > 4.3  CL 103   < > 105  CO2 24  --  26  BUN 22*   < > 21*  CREATININE 1.39*   < > 1.39*  CALCIUM 9.5  --  9.7  PROT 6.4*  --   --   BILITOT 0.5  --   --   ALKPHOS 90  --   --   ALT 12*  --   --   AST 21  --   --   GLUCOSE 91   < > 93   < > = values in this interval not displayed.   Lab Results  Component Value Date   CHOL 252 (H) 11/10/2017   HDL 46 11/10/2017   LDLCALC 167 (H) 11/10/2017   TRIG 197 (H) 11/10/2017   No results found for: [facility]   Radiology Studies    Ct Head Wo Contrast  Result Date: 11/09/2017 CLINICAL DATA:  Right-sided weakness. EXAM: CT HEAD WITHOUT CONTRAST TECHNIQUE: Contiguous axial images were obtained from the base of the skull through the vertex without intravenous contrast. COMPARISON:  CT scan of December 24, 2016. FINDINGS: Brain: No evidence of acute infarction, hemorrhage, hydrocephalus,  extra-axial collection or mass lesion/mass effect. Vascular: No hyperdense vessel or unexpected calcification. Skull: Normal. Negative  for fracture or focal lesion. Sinuses/Orbits: No acute finding. Other: None. IMPRESSION: Normal head CT. Electronically Signed   By: Marijo Conception, M.D.   On: 11/09/2017 15:23   Albert Brain Wo Contrast  Result Date: 11/10/2017 CLINICAL DATA:  Right-sided numbness and weakness EXAM: MRI HEAD WITHOUT CONTRAST MRA HEAD WITHOUT CONTRAST TECHNIQUE: Multiplanar, multiecho pulse sequences of the brain and surrounding structures were obtained without intravenous contrast. Angiographic images of the head were obtained using MRA technique without contrast. COMPARISON:  Head CT 11/09/2017 Brain MRI 12/25/2016 FINDINGS: MRI HEAD FINDINGS Brain: The midline structures are normal. No focal diffusion restriction to indicate acute infarct. No intraparenchymal hemorrhage. Unchanged multifocal white matter hyperintensity. This is nonspecific but most commonly seen in the setting of chronic small vessel ischemia. No mass lesion. No chronic microhemorrhage or cerebral amyloid angiopathy. No hydrocephalus, age advanced atrophy or lobar predominant volume loss. No dural abnormality or extra-axial collection. Skull and upper cervical spine: The visualized skull base, calvarium, upper cervical spine and extracranial soft tissues are normal. Sinuses/Orbits: No fluid levels or advanced mucosal thickening. No mastoid effusion. Normal orbits. MRA HEAD FINDINGS Intracranial internal carotid arteries: Normal. Anterior cerebral arteries: Normal. Middle cerebral arteries: Normal. Posterior communicating arteries: Present on the right. Posterior cerebral arteries: Normal. Basilar artery: Normal. Vertebral arteries: Left dominant. Normal. Superior cerebellar arteries: Normal. Anterior inferior cerebellar arteries: Normal. Posterior inferior cerebellar arteries: Normal. IMPRESSION: 1. No acute intracranial  abnormality. 2. No emergent large vessel occlusion or hemodynamically significant intracranial stenosis. 3. Unchanged appearance of multifocal white matter hyperintensity, nonspecific, but most commonly chronic small vessel ischemia. Electronically Signed   By: Ulyses Jarred M.D.   On: 11/10/2017 02:15   Dg Chest Port 1 View  Result Date: 11/09/2017 CLINICAL DATA:  Cough. EXAM: PORTABLE CHEST 1 VIEW COMPARISON:  12/03/2016 FINDINGS: Mild hyperinflation, similar to priors. Subsegmental atelectasis at the right lung base. Unchanged heart size and mediastinal contours. No focal airspace disease, pleural effusion or pneumothorax. No pulmonary edema. No acute osseous abnormalities. IMPRESSION: Mild chronic hyperinflation. Subsegmental atelectasis at the right lung base. Electronically Signed   By: Jeb Levering M.D.   On: 11/09/2017 20:51   Albert Jodene Nam Head Wo Contrast  Result Date: 11/10/2017 CLINICAL DATA:  Right-sided numbness and weakness EXAM: MRI HEAD WITHOUT CONTRAST MRA HEAD WITHOUT CONTRAST TECHNIQUE: Multiplanar, multiecho pulse sequences of the brain and surrounding structures were obtained without intravenous contrast. Angiographic images of the head were obtained using MRA technique without contrast. COMPARISON:  Head CT 11/09/2017 Brain MRI 12/25/2016 FINDINGS: MRI HEAD FINDINGS Brain: The midline structures are normal. No focal diffusion restriction to indicate acute infarct. No intraparenchymal hemorrhage. Unchanged multifocal white matter hyperintensity. This is nonspecific but most commonly seen in the setting of chronic small vessel ischemia. No mass lesion. No chronic microhemorrhage or cerebral amyloid angiopathy. No hydrocephalus, age advanced atrophy or lobar predominant volume loss. No dural abnormality or extra-axial collection. Skull and upper cervical spine: The visualized skull base, calvarium, upper cervical spine and extracranial soft tissues are normal. Sinuses/Orbits: No fluid levels  or advanced mucosal thickening. No mastoid effusion. Normal orbits. MRA HEAD FINDINGS Intracranial internal carotid arteries: Normal. Anterior cerebral arteries: Normal. Middle cerebral arteries: Normal. Posterior communicating arteries: Present on the right. Posterior cerebral arteries: Normal. Basilar artery: Normal. Vertebral arteries: Left dominant. Normal. Superior cerebellar arteries: Normal. Anterior inferior cerebellar arteries: Normal. Posterior inferior cerebellar arteries: Normal. IMPRESSION: 1. No acute intracranial abnormality. 2. No emergent large vessel occlusion or hemodynamically significant intracranial stenosis. 3.  Unchanged appearance of multifocal white matter hyperintensity, nonspecific, but most commonly chronic small vessel ischemia. Electronically Signed   By: Ulyses Jarred M.D.   On: 11/10/2017 02:15    ECG & Cardiac Imaging    EKG:  The EKG was personally reviewed and demonstrates SR with LVH  Echo: 11/10/17  Study Conclusions  - Left ventricle: The cavity size was normal. Wall thickness was   increased in a pattern of mild LVH. Systolic function was normal.   The estimated ejection fraction was in the range of 60% to 65%.   Wall motion was normal; there were no regional wall motion   abnormalities. Doppler parameters are consistent with abnormal   left ventricular relaxation (grade 1 diastolic dysfunction). The   E/e&' ratio is between 8-15, suggesting indeterminate LV filling   pressure. - Mitral valve: Calcified annulus. Mildly thickened leaflets .   There was trivial regurgitation. - Left atrium: The atrium was normal in size. - Atrial septum: Aneurysmal IAS - PFO is present. - Inferior vena cava: The vessel was normal in size. The   respirophasic diameter changes were in the normal range (>= 50%),   consistent with normal central venous pressure.  Impressions:  - LVEF 60-65%, mild LVH, normal wall motion, grade 1 DD,   indeterminate LV filling pressure,  trivial Albert, normal LA size,   aneurysmal IAS with PFO and left to right shunting by color   doppler, normal IVC. Consider referral for PFO closure in the   setting of [name]/stroke.  Assessment & Plan    62 year old male with past medical history of hypertension, and tobacco use who presented with right-sided weakness and slurred speech.  Found to have PFO on echocardiogram.  1. [name]: Several weeks of symptoms with weakness, tingling in the right upper and lower extremity. Also slurred speech and gait issues. CT head/MRI was negative. Has CRFs for stroke with uncontrolled HTN, HL and tobacco use. Has been started on ASA and statin per primary. Discussed the need for lifestyle modifications moving forward.   2. PFO with IAS: reported on echo this admission. Given this finding will need for work up with TEE but this can be done as a outpatient. Can arrange at the time of discharge.  -- would recommend LE dopplers  3. HTN: Has been on antihypertensives at home but lost insurance coverage and unable to afford medications. He has been restarted on amlodipine 10mg . Lisinopril/HCTZ has been stopped 2/2 to worsening Cr. Remains uncontrolled. -- add Coreg 6.25mg  BID  4. HL: LDL 166 this admission. Started on Lipitor 40mg  daily. Will need FLP/LFTs in 6 weeks.   5. Tobacco use: discussed the need for cessation. States he plans to quit.   Barnet Pall, NP-C Pager [phone] 11/11/2017, 2:56 PM   I have seen and examined the patient along with Reino Bellis, NP-C.  I have reviewed the chart, notes and new data.  I agree with PA/NP's note.  Key new complaints: neorological complaints have resolved.  Does not have angina, palpitations, syncope, bleeding problems.  Does complain of occasional shortness of breath and wheezing.  The dyspnea is not necessarily exertional.  He thinks is because of his smoking. Key examination changes: Normal cardiovascular exam Key new findings / data:  Echocardiogram images viewed in detail.  I agree with the assessment that he has a intra-atrial septal aneurysm, but I am not convinced that I see evidence of right-to-left shunting on color Doppler.  Saline contrast was not administered.  Otherwise  low risk findings on echocardiography.  No evidence of diastolic dysfunction due to LVH.  PLAN: I think is much more likely that his neurological events are related to conventional cardiovascular risk factors such as poorly treated hypertension, ongoing smoking and I think our efforts should be primarily focused on smoking cessation and aggressive treatment of systemic hypertension. Amlodipine has been restarted.  He was previously on lisinopril-hydrochlorothiazide but he has mild renal function abnormalities.  We could use a beta-blocker instead.  Since he has occasional wheezing, I would use a more selective agent such as metoprolol rather than carvedilol.  Further titration of his medications can be performed as an outpatient. He may indeed have a PFO associated with his atrial septal aneurysm, but it is more likely that this is an innocent bystander abnormality that has been present for 63 years without previous neurological events.  It would be better assessed by TEE, but I think such evaluation can be deferred unless he has further neurological events after his blood pressure has been well treated and he has stopped smoking.Marland Kitchen He prefers to defer any extensive medical testing until after he secures Medicare since he is currently without health insurance.  This is perfectly reasonable since TEE is not at all urgent  Sanda Klein, MD, [facility] [phone] 11/11/2017, 4:16 PM

## 2017-11-11 NOTE — Progress Notes (Signed)
Patient discharged home.  AVS discussed and prescription given.  Patient is comfortable upon discharged.  Vital signs stable.

## 2017-11-11 NOTE — Progress Notes (Signed)
Physical Therapy Treatment and Discharge  Patient Details Name: Albert Harris MRN: [id number] DOB: 1955/08/08 Today's Date: 11/11/2017    History of Present Illness Pt is a 62 y.o. male presenting with intermittent R sided numbness and weakness. MRI on 6/5 negative for acute infarct. PMHx: HTN, [name], asthma.    PT Comments    Patient motivated to work with PT this AM. Subjective reports of feeling as if normal mobility has returned with less grogginess and with instability. PT session today focusing on challenging balance with gait with head turns, changing gait speed, and turning - all done at Mod I level with no LOB or instability. Able to ascend/descend stairs without handrail and no LOB. All education completed today including review of stroke symptoms with good verbal understanding. PT to sign off at this time. Thanks!    Follow Up Recommendations  No PT follow up     Equipment Recommendations  None recommended by PT    Recommendations for Other Services       Precautions / Restrictions Precautions Precautions: Fall Restrictions Weight Bearing Restrictions: No    Mobility  Bed Mobility Overal bed mobility: Modified Independent             General bed mobility comments: patient able to perform bed mobility and come to EOB without issue  Transfers Overall transfer level: Modified independent Equipment used: None             General transfer comment: able to stand from low bed and adjust gown without instability  Ambulation/Gait Ambulation/Gait assistance: Supervision;Modified independent (Device/Increase time) Ambulation Distance (Feet): 250 Feet Assistive device: None Gait Pattern/deviations: Step-through pattern     General Gait Details: improved stability with gait today. Reports less grogginess and feels as if normal mobility has returned; no instability with head turns, changing gait speed, or turning   Stairs Stairs: Yes Stairs assistance:  Modified independent (Device/Increase time) Stair Management: No rails Number of Stairs: 3 General stair comments: up/down small stair case - no rails - quick pace but without LOB   Wheelchair Mobility    Modified Rankin (Stroke Patients Only)       Balance Overall balance assessment: Modified Independent                                          Cognition Arousal/Alertness: Awake/alert Behavior During Therapy: WFL for tasks assessed/performed Overall Cognitive Status: Within Functional Limits for tasks assessed                                        Exercises      General Comments        Pertinent Vitals/Pain Pain Assessment: No/denies pain    Home Living                      Prior Function            PT Goals (current goals can now be found in the care plan section) Acute Rehab PT Goals Patient Stated Goal: return home PT Goal Formulation: With patient Time For Goal Achievement: 11/24/17 Potential to Achieve Goals: Good Progress towards PT goals: Goals met/education completed, patient discharged from PT    Frequency    Min 4X/week      PT Plan  Current plan remains appropriate    Co-evaluation              AM-PAC PT "6 Clicks" Daily Activity  Outcome Measure  Difficulty turning over in bed (including adjusting bedclothes, sheets and blankets)?: None Difficulty moving from lying on back to sitting on the side of the bed? : None Difficulty sitting down on and standing up from a chair with arms (e.g., wheelchair, bedside commode, etc,.)?: None Help needed moving to and from a bed to chair (including a wheelchair)?: None Help needed walking in hospital room?: None Help needed climbing 3-5 steps with a railing? : None 6 Click Score: 24    End of Session Equipment Utilized During Treatment: Gait belt Activity Tolerance: Patient tolerated treatment well Patient left: in bed;with call bell/phone within  reach;with family/visitor present Nurse Communication: Mobility status PT Visit Diagnosis: Unsteadiness on feet (R26.81);Other abnormalities of gait and mobility (R26.89)     Time: 5521-7471 PT Time Calculation (min) (ACUTE ONLY): 13 min  Charges:  $Gait Training: 8-22 mins                    G Codes:       Lanney Gins, PT, DPT 11/11/17 8:41 AM

## 2017-11-11 NOTE — Progress Notes (Signed)
PROGRESS NOTE    Albert Harris  BHA:[id number] DOB: 07/01/1955 DOA: 11/09/2017 PCP: [name], PA    Brief Narrative: Albert Harris is a 62 y.o. male with medical history significant of HTN, [name], asthma, and tobacco abuse; who presents with complaints of intermittent right-sided numbness and weakness. He was admitted for evaluation of [name] vs stroke.    Assessment & Plan:   Principal Problem:   Right sided weakness Active Problems:   Hypertensive urgency   [name] (transient ischemic attack)   Tobacco abuse   CKD (chronic kidney disease), stage III (HCC)   Right sided weakness and numbness of the right upper extremity:  - initial CT head and MRI / MRA head negative for acute stroke .  - therapy evals done and no further follow up .  -hgba1c is 5.2.  - LDL 167, on aspirin 325 mg daily and lipitor 40 mg daily.  Echocardiogram showed aneurysmal IAS with PFO, discussed with neurology , recommended cardiology consult for further evaluation.  - carotid duplex does not show any significant stenosis.    Hypertensive urgency  bp still not well controlled.  On norvasc 10 mg daily, will add hydralazine 10 mg TID.    STAGE 1 ckd Creatinine at baseline.    Tobacco abuse:  On nicotine patch    DVT prophylaxis:lovenox.  Code Status: full code.  Family Communication: discussed with family at bedside.  Disposition Plan:  Pending evalaution by cardiology.   Consultants:   Cardiology  Curb side with neurology.   Procedures: MRI brain Echocardiogram Carotid duplex    Antimicrobials: none    Subjective: No new complaints, his right sided weakness resolved. The numbness only in his right fingers.   Objective: Vitals:   11/11/17 0004 11/11/17 0346 11/11/17 0916 11/11/17 1248  BP: (!) 187/94 137/90 (!) 177/86 (!) 174/97  Pulse: 71 67 74 79  Resp: 18 18 16 16   Temp: 97.8 F (36.6 C) 98.2 F (36.8 C) 97.9 F (36.6 C) 97.8 F (36.6 C)  TempSrc: Oral Oral Oral Oral   SpO2: 97% 97% 99% 99%  Weight:      Height:        Intake/Output Summary (Last 24 hours) at 11/11/2017 1253 Last data filed at 11/11/2017 0917 Gross per 24 hour  Intake 360 ml  Output 200 ml  Net 160 ml   Filed Weights   11/09/17 2102  Weight: 64.3 kg (141 lb 12.1 oz)    Examination:  General exam: Appears calm and comfortable  Respiratory system: Clear to auscultation. Respiratory effort normal. Cardiovascular system: S1 & S2 heard, RRR. No JVD, murmurs, rubs, gallops or clicks. No pedal edema. Gastrointestinal system: Abdomen is nondistended, soft and nontender. No organomegaly or masses felt. Normal bowel sounds heard. Central nervous system: Alert and oriented. Numbness in the right fingers.  Extremities: Symmetric 5 x 5 power. Skin: No rashes, lesions or ulcers Psychiatry: Judgement and insight appear normal. Mood & affect appropriate.     Data Reviewed: I have personally reviewed following labs and imaging studies  CBC: Recent Labs  Lab 11/09/17 1439 11/09/17 1453 11/10/17 0721  WBC 11.9*  --  8.4  NEUTROABS 8.0*  --  5.0  HGB 16.1 16.7 16.1  HCT 48.5 49.0 48.3  MCV 94.9  --  94.3  PLT 271  --  973   Basic Metabolic Panel: Recent Labs  Lab 11/09/17 1439 11/09/17 1453 11/10/17 0721  NA 137 137 141  K 3.9 3.9 4.3  CL 103 102 105  CO2 24  --  26  GLUCOSE 91 98 93  BUN 22* 21* 21*  CREATININE 1.39* 1.40* 1.39*  CALCIUM 9.5  --  9.7   GFR: Estimated Creatinine Clearance: 49.5 mL/min (A) (by C-G formula based on SCr of 1.39 mg/dL (H)). Liver Function Tests: Recent Labs  Lab 11/09/17 1439  AST 21  ALT 12*  ALKPHOS 90  BILITOT 0.5  PROT 6.4*  ALBUMIN 2.6*   No results for input(s): LIPASE, AMYLASE in the last 168 hours. No results for input(s): AMMONIA in the last 168 hours. Coagulation Profile: Recent Labs  Lab 11/09/17 1439  INR 0.88   Cardiac Enzymes: No results for input(s): CKTOTAL, CKMB, CKMBINDEX, TROPONINI in the last 168  hours. BNP (last 3 results) No results for input(s): PROBNP in the last 8760 hours. HbA1C: Recent Labs    11/10/17 0721  HGBA1C 5.4   CBG: Recent Labs  Lab 11/09/17 1814  GLUCAP 76   Lipid Profile: Recent Labs    11/10/17 0721  CHOL 252*  HDL 46  LDLCALC 167*  TRIG 197*  CHOLHDL 5.5   Thyroid Function Tests: No results for input(s): TSH, T4TOTAL, FREET4, T3FREE, THYROIDAB in the last 72 hours. Anemia Panel: No results for input(s): VITAMINB12, FOLATE, FERRITIN, TIBC, IRON, RETICCTPCT in the last 72 hours. Sepsis Labs: No results for input(s): PROCALCITON, LATICACIDVEN in the last 168 hours.  No results found for this or any previous visit (from the past 240 hour(s)).       Radiology Studies: Ct Head Wo Contrast  Result Date: 11/09/2017 CLINICAL DATA:  Right-sided weakness. EXAM: CT HEAD WITHOUT CONTRAST TECHNIQUE: Contiguous axial images were obtained from the base of the skull through the vertex without intravenous contrast. COMPARISON:  CT scan of December 24, 2016. FINDINGS: Brain: No evidence of acute infarction, hemorrhage, hydrocephalus, extra-axial collection or mass lesion/mass effect. Vascular: No hyperdense vessel or unexpected calcification. Skull: Normal. Negative for fracture or focal lesion. Sinuses/Orbits: No acute finding. Other: None. IMPRESSION: Normal head CT. Electronically Signed   By: Marijo Conception, M.D.   On: 11/09/2017 15:23   Mr Brain Wo Contrast  Result Date: 11/10/2017 CLINICAL DATA:  Right-sided numbness and weakness EXAM: MRI HEAD WITHOUT CONTRAST MRA HEAD WITHOUT CONTRAST TECHNIQUE: Multiplanar, multiecho pulse sequences of the brain and surrounding structures were obtained without intravenous contrast. Angiographic images of the head were obtained using MRA technique without contrast. COMPARISON:  Head CT 11/09/2017 Brain MRI 12/25/2016 FINDINGS: MRI HEAD FINDINGS Brain: The midline structures are normal. No focal diffusion restriction to  indicate acute infarct. No intraparenchymal hemorrhage. Unchanged multifocal white matter hyperintensity. This is nonspecific but most commonly seen in the setting of chronic small vessel ischemia. No mass lesion. No chronic microhemorrhage or cerebral amyloid angiopathy. No hydrocephalus, age advanced atrophy or lobar predominant volume loss. No dural abnormality or extra-axial collection. Skull and upper cervical spine: The visualized skull base, calvarium, upper cervical spine and extracranial soft tissues are normal. Sinuses/Orbits: No fluid levels or advanced mucosal thickening. No mastoid effusion. Normal orbits. MRA HEAD FINDINGS Intracranial internal carotid arteries: Normal. Anterior cerebral arteries: Normal. Middle cerebral arteries: Normal. Posterior communicating arteries: Present on the right. Posterior cerebral arteries: Normal. Basilar artery: Normal. Vertebral arteries: Left dominant. Normal. Superior cerebellar arteries: Normal. Anterior inferior cerebellar arteries: Normal. Posterior inferior cerebellar arteries: Normal. IMPRESSION: 1. No acute intracranial abnormality. 2. No emergent large vessel occlusion or hemodynamically significant intracranial stenosis. 3. Unchanged appearance of multifocal white matter  hyperintensity, nonspecific, but most commonly chronic small vessel ischemia. Electronically Signed   By: Ulyses Jarred M.D.   On: 11/10/2017 02:15   Dg Chest Port 1 View  Result Date: 11/09/2017 CLINICAL DATA:  Cough. EXAM: PORTABLE CHEST 1 VIEW COMPARISON:  12/03/2016 FINDINGS: Mild hyperinflation, similar to priors. Subsegmental atelectasis at the right lung base. Unchanged heart size and mediastinal contours. No focal airspace disease, pleural effusion or pneumothorax. No pulmonary edema. No acute osseous abnormalities. IMPRESSION: Mild chronic hyperinflation. Subsegmental atelectasis at the right lung base. Electronically Signed   By: Jeb Levering M.D.   On: 11/09/2017 20:51    Mr Jodene Nam Head Wo Contrast  Result Date: 11/10/2017 CLINICAL DATA:  Right-sided numbness and weakness EXAM: MRI HEAD WITHOUT CONTRAST MRA HEAD WITHOUT CONTRAST TECHNIQUE: Multiplanar, multiecho pulse sequences of the brain and surrounding structures were obtained without intravenous contrast. Angiographic images of the head were obtained using MRA technique without contrast. COMPARISON:  Head CT 11/09/2017 Brain MRI 12/25/2016 FINDINGS: MRI HEAD FINDINGS Brain: The midline structures are normal. No focal diffusion restriction to indicate acute infarct. No intraparenchymal hemorrhage. Unchanged multifocal white matter hyperintensity. This is nonspecific but most commonly seen in the setting of chronic small vessel ischemia. No mass lesion. No chronic microhemorrhage or cerebral amyloid angiopathy. No hydrocephalus, age advanced atrophy or lobar predominant volume loss. No dural abnormality or extra-axial collection. Skull and upper cervical spine: The visualized skull base, calvarium, upper cervical spine and extracranial soft tissues are normal. Sinuses/Orbits: No fluid levels or advanced mucosal thickening. No mastoid effusion. Normal orbits. MRA HEAD FINDINGS Intracranial internal carotid arteries: Normal. Anterior cerebral arteries: Normal. Middle cerebral arteries: Normal. Posterior communicating arteries: Present on the right. Posterior cerebral arteries: Normal. Basilar artery: Normal. Vertebral arteries: Left dominant. Normal. Superior cerebellar arteries: Normal. Anterior inferior cerebellar arteries: Normal. Posterior inferior cerebellar arteries: Normal. IMPRESSION: 1. No acute intracranial abnormality. 2. No emergent large vessel occlusion or hemodynamically significant intracranial stenosis. 3. Unchanged appearance of multifocal white matter hyperintensity, nonspecific, but most commonly chronic small vessel ischemia. Electronically Signed   By: Ulyses Jarred M.D.   On: 11/10/2017 02:15         Scheduled Meds: . amLODipine  10 mg Oral Daily  . aspirin  325 mg Oral Daily  . atorvastatin  40 mg Oral q1800  . enoxaparin (LOVENOX) injection  40 mg Subcutaneous Q24H  . nicotine  21 mg Transdermal Daily   Continuous Infusions:   LOS: 0 days    Time spent: 35 minutes.     Hosie Poisson, MD [facility] Pager [phone]  If 7PM-7AM, please contact night-coverage [email or web address] Password [facility] 11/11/2017, 12:53 PM

## 2017-11-12 ENCOUNTER — Telehealth: Payer: Self-pay | Admitting: *Deleted

## 2017-11-12 NOTE — Telephone Encounter (Signed)
Left voicemail requesting call back to complete hospital follow up call.

## 2017-11-12 NOTE — Progress Notes (Signed)
11/12/2017 at 1530: Pt discharged late yesterday. Pt was provided card to assist with the cost of his d/c meds. Pt to f/u with [facility]. Pt had transportation home.

## 2017-11-13 NOTE — Discharge Summary (Signed)
Physician Discharge Summary  Albert Harris RSW:[id number] DOB: 1956/01/31 DOA: 11/09/2017  PCP: [name], PA  Admit date: 11/09/2017 Discharge date: 11/11/2017  Admitted From: Home.  Disposition:  Home.   Recommendations for Outpatient Follow-up:  1. Follow up with PCP in 1-2 weeks 2. Please obtain BMP/CBC in one week 3. Please follow up with neurology in 4 to 6 weeks.  4. Please follow up with cardiology for TEE.    Discharge Condition:stable.  CODE STATUS: FULL CODE.  Diet recommendation: Heart Healthy   Brief/Interim Summary: Albert Harris a 62 y.o.malewith medical history significant ofHTN, [name], asthma, and tobacco abuse;who presents with complaints of intermittent right-sided numbness and weakness. He was admitted for evaluation of [name] vs stroke.     Discharge Diagnoses:  Principal Problem:   Right sided weakness Active Problems:   Hypertensive urgency   [name] (transient ischemic attack)   Tobacco abuse   CKD (chronic kidney disease), stage III (HCC)  Right sided weakness and numbness of the right upper extremity:  - initial CT head and MRI / MRA head negative for acute stroke .  - therapy evals done and no further follow up .  -hgba1c is 5.2.  - LDL 167, on aspirin 325 mg daily and lipitor 40 mg daily.  Echocardiogram showed aneurysmal IAS with PFO, discussed with neurology , recommended cardiology consult for further evaluation. Cardiology recommended outpatient follow up with TEE.  - carotid duplex does not show any significant stenosis.    Hypertensive urgency  bp still not well controlled.  On norvasc 10 mg daily, will add metoprolol for better control.   STAGE 1 ckd Creatinine at baseline.    Tobacco abuse:  On nicotine patch     Discharge Instructions  Discharge Instructions    Diet - low sodium heart healthy   Complete by:  As directed    Discharge instructions   Complete by:  As directed    Please follow up with neurology in  one month.  Please follow up with cardiology when you obtain your medical insurance for TEE.     Allergies as of 11/11/2017   No Known Allergies     Medication List    STOP taking these medications   aspirin-acetaminophen-caffeine 250-250-65 MG tablet Commonly known as:  EXCEDRIN MIGRAINE   lisinopril-hydrochlorothiazide 20-12.5 MG tablet Commonly known as:  PRINZIDE,ZESTORETIC     TAKE these medications   amLODipine 10 MG tablet Commonly known as:  NORVASC Take 1 tablet (10 mg total) by mouth daily. What changed:  See the new instructions.   aspirin 325 MG tablet Take 1 tablet (325 mg total) by mouth daily.   atorvastatin 40 MG tablet Commonly known as:  LIPITOR Take 1 tablet (40 mg total) by mouth daily at 6 PM. What changed:  See the new instructions.   metoprolol tartrate 25 MG tablet Commonly known as:  LOPRESSOR Take 1 tablet (25 mg total) by mouth 2 (two) times daily.   nicotine 14 mg/24hr patch Commonly known as:  NICODERM CQ - dosed in mg/24 hours Place 1 patch (14 mg total) onto the skin daily.   trolamine salicylate 10 % cream Commonly known as:  ASPERCREME Apply 1 application topically as needed for muscle pain.      Follow-up Information    [facility] Follow up on 12/07/2017.   Why:  Your appointment time is 1:30. Please arrive 15 min early and bring a picture ID and your current medications.  Contact information: [place] [id number] [facility].   Why:  Please use this location for your pharmacy needs.  Contact information: [place] [phone] [phone]       Rosalin Hawking, MD. Schedule an appointment as soon as possible for a visit in 1 month(s).   Specialty:  Neurology Why:  FOR [name]  Contact information: [address] [place]  [address] [phone]        Sanda Klein, MD. Call.   Specialty:  Cardiology Why:  for appt for TEE.  Contact information: [address] [place] [address] [phone]          No Known Allergies  Consultations:  Neurology over the phone  Cardiology.    Procedures/Studies: Ct Head Wo Contrast  Result Date: 11/09/2017 CLINICAL DATA:  Right-sided weakness. EXAM: CT HEAD WITHOUT CONTRAST TECHNIQUE: Contiguous axial images were obtained from the base of the skull through the vertex without intravenous contrast. COMPARISON:  CT scan of December 24, 2016. FINDINGS: Brain: No evidence of acute infarction, hemorrhage, hydrocephalus, extra-axial collection or mass lesion/mass effect. Vascular: No hyperdense vessel or unexpected calcification. Skull: Normal. Negative for fracture or focal lesion. Sinuses/Orbits: No acute finding. Other: None. IMPRESSION: Normal head CT. Electronically Signed   By: Marijo Conception, M.D.   On: 11/09/2017 15:23   Mr Brain Wo Contrast  Result Date: 11/10/2017 CLINICAL DATA:  Right-sided numbness and weakness EXAM: MRI HEAD WITHOUT CONTRAST MRA HEAD WITHOUT CONTRAST TECHNIQUE: Multiplanar, multiecho pulse sequences of the brain and surrounding structures were obtained without intravenous contrast. Angiographic images of the head were obtained using MRA technique without contrast. COMPARISON:  Head CT 11/09/2017 Brain MRI 12/25/2016 FINDINGS: MRI HEAD FINDINGS Brain: The midline structures are normal. No focal diffusion restriction to indicate acute infarct. No intraparenchymal hemorrhage. Unchanged multifocal white matter hyperintensity. This is nonspecific but most commonly seen in the setting of chronic small vessel ischemia. No mass lesion. No chronic microhemorrhage or cerebral amyloid angiopathy. No hydrocephalus, age advanced atrophy or lobar predominant volume loss. No dural abnormality or extra-axial collection. Skull and upper cervical  spine: The visualized skull base, calvarium, upper cervical spine and extracranial soft tissues are normal. Sinuses/Orbits: No fluid levels or advanced mucosal thickening. No mastoid effusion. Normal orbits. MRA HEAD FINDINGS Intracranial internal carotid arteries: Normal. Anterior cerebral arteries: Normal. Middle cerebral arteries: Normal. Posterior communicating arteries: Present on the right. Posterior cerebral arteries: Normal. Basilar artery: Normal. Vertebral arteries: Left dominant. Normal. Superior cerebellar arteries: Normal. Anterior inferior cerebellar arteries: Normal. Posterior inferior cerebellar arteries: Normal. IMPRESSION: 1. No acute intracranial abnormality. 2. No emergent large vessel occlusion or hemodynamically significant intracranial stenosis. 3. Unchanged appearance of multifocal white matter hyperintensity, nonspecific, but most commonly chronic small vessel ischemia. Electronically Signed   By: Ulyses Jarred M.D.   On: 11/10/2017 02:15   Dg Chest Port 1 View  Result Date: 11/09/2017 CLINICAL DATA:  Cough. EXAM: PORTABLE CHEST 1 VIEW COMPARISON:  12/03/2016 FINDINGS: Mild hyperinflation, similar to priors. Subsegmental atelectasis at the right lung base. Unchanged heart size and mediastinal contours. No focal airspace disease, pleural effusion or pneumothorax. No pulmonary edema. No acute osseous abnormalities. IMPRESSION: Mild chronic hyperinflation. Subsegmental atelectasis at the right lung base. Electronically Signed   By: Jeb Levering M.D.   On: 11/09/2017 20:51   Mr Jodene Nam Head Wo Contrast  Result  Date: 11/10/2017 CLINICAL DATA:  Right-sided numbness and weakness EXAM: MRI HEAD WITHOUT CONTRAST MRA HEAD WITHOUT CONTRAST TECHNIQUE: Multiplanar, multiecho pulse sequences of the brain and surrounding structures were obtained without intravenous contrast. Angiographic images of the head were obtained using MRA technique without contrast. COMPARISON:  Head CT 11/09/2017 Brain MRI  12/25/2016 FINDINGS: MRI HEAD FINDINGS Brain: The midline structures are normal. No focal diffusion restriction to indicate acute infarct. No intraparenchymal hemorrhage. Unchanged multifocal white matter hyperintensity. This is nonspecific but most commonly seen in the setting of chronic small vessel ischemia. No mass lesion. No chronic microhemorrhage or cerebral amyloid angiopathy. No hydrocephalus, age advanced atrophy or lobar predominant volume loss. No dural abnormality or extra-axial collection. Skull and upper cervical spine: The visualized skull base, calvarium, upper cervical spine and extracranial soft tissues are normal. Sinuses/Orbits: No fluid levels or advanced mucosal thickening. No mastoid effusion. Normal orbits. MRA HEAD FINDINGS Intracranial internal carotid arteries: Normal. Anterior cerebral arteries: Normal. Middle cerebral arteries: Normal. Posterior communicating arteries: Present on the right. Posterior cerebral arteries: Normal. Basilar artery: Normal. Vertebral arteries: Left dominant. Normal. Superior cerebellar arteries: Normal. Anterior inferior cerebellar arteries: Normal. Posterior inferior cerebellar arteries: Normal. IMPRESSION: 1. No acute intracranial abnormality. 2. No emergent large vessel occlusion or hemodynamically significant intracranial stenosis. 3. Unchanged appearance of multifocal white matter hyperintensity, nonspecific, but most commonly chronic small vessel ischemia. Electronically Signed   By: Ulyses Jarred M.D.   On: 11/10/2017 02:15    Echocardiogram.    Subjective:  No chest pain or sob.    Discharge Exam: Vitals:   11/11/17 1248 11/11/17 1659  BP: (!) 174/97 (!) 167/93  Pulse: 79 77  Resp: 16 20  Temp: 97.8 F (36.6 C) 97.7 F (36.5 C)  SpO2: 99% 100%   Vitals:   11/11/17 0346 11/11/17 0916 11/11/17 1248 11/11/17 1659  BP: 137/90 (!) 177/86 (!) 174/97 (!) 167/93  Pulse: 67 74 79 77  Resp: 18 16 16 20   Temp: 98.2 F (36.8 C) 97.9 F  (36.6 C) 97.8 F (36.6 C) 97.7 F (36.5 C)  TempSrc: Oral Oral Oral Oral  SpO2: 97% 99% 99% 100%  Weight:      Height:        General: Pt is alert, awake, not in acute distress Cardiovascular: RRR, S1/S2 +, no rubs, no gallops Respiratory: CTA bilaterally, no wheezing, no rhonchi Abdominal: Soft, NT, ND, bowel sounds + Extremities: no edema, no cyanosis    The results of significant diagnostics from this hospitalization (including imaging, microbiology, ancillary and laboratory) are listed below for reference.     Microbiology: No results found for this or any previous visit (from the past 240 hour(s)).   Labs: BNP (last 3 results) No results for input(s): BNP in the last 8760 hours. Basic Metabolic Panel: Recent Labs  Lab 11/09/17 1439 11/09/17 1453 11/10/17 0721  NA 137 137 141  K 3.9 3.9 4.3  CL 103 102 105  CO2 24  --  26  GLUCOSE 91 98 93  BUN 22* 21* 21*  CREATININE 1.39* 1.40* 1.39*  CALCIUM 9.5  --  9.7   Liver Function Tests: Recent Labs  Lab 11/09/17 1439  AST 21  ALT 12*  ALKPHOS 90  BILITOT 0.5  PROT 6.4*  ALBUMIN 2.6*   No results for input(s): LIPASE, AMYLASE in the last 168 hours. No results for input(s): AMMONIA in the last 168 hours. CBC: Recent Labs  Lab 11/09/17 1439 11/09/17 1453 11/10/17 0721  WBC  11.9*  --  8.4  NEUTROABS 8.0*  --  5.0  HGB 16.1 16.7 16.1  HCT 48.5 49.0 48.3  MCV 94.9  --  94.3  PLT 271  --  254   Cardiac Enzymes: No results for input(s): CKTOTAL, CKMB, CKMBINDEX, TROPONINI in the last 168 hours. BNP: Invalid input(s): POCBNP CBG: Recent Labs  Lab 11/09/17 1814  GLUCAP 76   D-Dimer No results for input(s): DDIMER in the last 72 hours. Hgb A1c No results for input(s): HGBA1C in the last 72 hours. Lipid Profile No results for input(s): CHOL, HDL, LDLCALC, TRIG, CHOLHDL, LDLDIRECT in the last 72 hours. Thyroid function studies No results for input(s): TSH, T4TOTAL, T3FREE, THYROIDAB in the last 72  hours.  Invalid input(s): FREET3 Anemia work up No results for input(s): VITAMINB12, FOLATE, FERRITIN, TIBC, IRON, RETICCTPCT in the last 72 hours. Urinalysis    Component Value Date/Time   COLORURINE STRAW (A) 12/24/2016 2232   APPEARANCEUR CLEAR 12/24/2016 2232   LABSPEC 1.016 12/24/2016 2232   PHURINE 7.0 12/24/2016 2232   GLUCOSEU NEGATIVE 12/24/2016 2232   HGBUR NEGATIVE 12/24/2016 2232   BILIRUBINUR NEGATIVE 12/24/2016 2232   KETONESUR NEGATIVE 12/24/2016 2232   PROTEINUR 100 (A) 12/24/2016 2232   UROBILINOGEN 1.0 03/19/2014 1051   NITRITE NEGATIVE 12/24/2016 2232   LEUKOCYTESUR NEGATIVE 12/24/2016 2232   Sepsis Labs Invalid input(s): PROCALCITONIN,  WBC,  LACTICIDVEN Microbiology No results found for this or any previous visit (from the past 240 hour(s)).   Time coordinating discharge: 32  minutes  SIGNED:   Hosie Poisson, MD  [facility] 11/13/2017, 9:27 AM Pager   If 7PM-7AM, please contact night-coverage [email or web address] Password [email or web address]

## 2017-11-15 NOTE — Telephone Encounter (Signed)
Left voicemail requesting call back.  

## 2017-11-19 NOTE — Telephone Encounter (Signed)
Left voicemail requesting call back.  

## 2017-11-23 NOTE — Telephone Encounter (Signed)
Left voicemail requesting call back.  

## 2017-12-07 ENCOUNTER — Encounter (INDEPENDENT_AMBULATORY_CARE_PROVIDER_SITE_OTHER): Payer: Self-pay | Admitting: Physician Assistant

## 2017-12-07 ENCOUNTER — Ambulatory Visit (INDEPENDENT_AMBULATORY_CARE_PROVIDER_SITE_OTHER): Payer: Self-pay | Admitting: Physician Assistant

## 2017-12-07 VITALS — BP 133/72 | HR 62 | Temp 97.9°F | Resp 18 | Ht 66.0 in | Wt 143.0 lb

## 2017-12-07 DIAGNOSIS — E7841 Elevated Lipoprotein(a): Secondary | ICD-10-CM

## 2017-12-07 DIAGNOSIS — G459 Transient cerebral ischemic attack, unspecified: Secondary | ICD-10-CM

## 2017-12-07 DIAGNOSIS — I10 Essential (primary) hypertension: Secondary | ICD-10-CM

## 2017-12-07 DIAGNOSIS — Z09 Encounter for follow-up examination after completed treatment for conditions other than malignant neoplasm: Secondary | ICD-10-CM

## 2017-12-07 DIAGNOSIS — M79604 Pain in right leg: Secondary | ICD-10-CM

## 2017-12-07 DIAGNOSIS — Z1211 Encounter for screening for malignant neoplasm of colon: Secondary | ICD-10-CM

## 2017-12-07 DIAGNOSIS — R931 Abnormal findings on diagnostic imaging of heart and coronary circulation: Secondary | ICD-10-CM

## 2017-12-07 DIAGNOSIS — F17200 Nicotine dependence, unspecified, uncomplicated: Secondary | ICD-10-CM

## 2017-12-07 MED ORDER — ATORVASTATIN CALCIUM 40 MG PO TABS: 40.0000 mg | ORAL_TABLET | Freq: Every day | ORAL | 5 refills | Status: DC

## 2017-12-07 MED ORDER — METOPROLOL TARTRATE 25 MG PO TABS
25.0000 mg | ORAL_TABLET | Freq: Two times a day (BID) | ORAL | 5 refills | Status: DC
Start: 2017-12-07 — End: 2018-03-11

## 2017-12-07 MED ORDER — NICOTINE 14 MG/24HR TD PT24: 14.0000 mg | MEDICATED_PATCH | Freq: Every day | TRANSDERMAL | 0 refills | Status: DC

## 2017-12-07 MED ORDER — ASPIRIN 325 MG PO TABS: 325.0000 mg | ORAL_TABLET | Freq: Every day | ORAL | 3 refills | Status: DC

## 2017-12-07 MED ORDER — AMLODIPINE BESYLATE 10 MG PO TABS: 10.0000 mg | ORAL_TABLET | Freq: Every day | ORAL | 5 refills | Status: DC

## 2017-12-07 MED FILL — METOPROLOL TARTRATE 25 MG T: 25 | 30 days supply | Qty: 60 | Fill #0

## 2017-12-07 MED FILL — ATORVASTATIN CALCIUM 40 MG: 40 | 30 days supply | Qty: 30 | Fill #0

## 2017-12-07 MED FILL — AMLODIPINE BESYLATE 10 MG T: 10 | 30 days supply | Qty: 30 | Fill #0

## 2017-12-07 NOTE — Patient Instructions (Signed)
Please go to have your right lower extremity ultrasound to help rule out deep vein thrombosis.   Deep Vein Thrombosis Deep vein thrombosis (DVT) is a condition in which a blood clot forms in a deep vein, such as a lower leg, thigh, or arm vein. A clot is blood that has thickened into a gel or solid. This condition is dangerous. It can lead to serious and even life-threatening complications if the clot travels to the lungs and causes a blockage (pulmonary embolism). It can also damage veins in the leg. This can result in leg pain, swelling, discoloration, and sores (post-thrombotic syndrome). What are the causes? This condition may be caused by:  A slowdown of blood flow.  Damage to a vein.  A condition that makes blood clot more easily.  What increases the risk? The following factors may make you more likely to develop this condition:  Being overweight.  Being elderly, especially over age 48.  Sitting or lying down for more than four hours.  Lack of physical activity (sedentary lifestyle).  Being pregnant, giving birth, or having recently given birth.  Taking medicines that contain estrogen.  Smoking.  A history of any of the following: ? Blood clots or blood clotting disease. ? Peripheral vascular disease. ? Inflammatory bowel disease. ? Cancer. ? Heart disease. ? Genetic conditions that affect how blood clots. ? Neurological diseases that affect the legs (leg paresis). ? Injury. ? Major or lengthy surgery. ? A central line placed inside a large vein.  What are the signs or symptoms? Symptoms of this condition include:  Swelling, pain, or tenderness in an arm or leg.  Warmth, redness, or discoloration in an arm or leg.  If the clot is in your leg, symptoms may be more noticeable or worse when you stand or walk. Some people do not have any symptoms. How is this diagnosed? This condition is diagnosed with:  A medical history.  A physical exam.  Tests, such  as: ? Blood tests. These are done to see how your blood clots. ? Imaging tests. These are done to check for clots. Tests may include:  Ultrasound.  CT scan.  MRI.  X-ray.  Venogram. For this test, X-rays are taken after a dye is injected into a vein.  How is this treated? Treatment for this condition depends on the cause, your risk for bleeding or developing more clots, and any medical conditions you have. Treatment may include:  Taking blood thinners (also called anticoagulants). These medicines may be taken by mouth, injected under the skin, or injected through an IV tube (catheter). These medicines prevent clots from forming.  Injecting medicine that dissolves blood clots into the affected vein (catheter-directed thrombolysis).  Having surgery. Surgery may be done to: ? Remove the clot. ? Place a filter in a large vein to catch blood clots before they reach the lungs.  Some treatments may be continued for up to six months. Follow these instructions at home: If you are taking an oral blood thinner:  Take the medicine exactly as told by your health care provider. Some blood thinners need to be taken at the same time every day. Do not skip a dose.  Ask your health care provider about what foods and drugs interact with the medicine.  Ask about possible side effects. General instructions  Blood thinners can cause easy bruising and difficulty stopping bleeding. Because of this, if you are taking or were given a blood thinner: ? Hold pressure over cuts for longer  than usual. ? Tell your dentist and other health care providers that you are taking blood thinners before having any procedures that can cause bleeding. ? Avoid contact sports.  Take over-the-counter and prescription medicines only as told by your health care provider.  Return to your normal activities as told by your health care provider. Ask your health care provider what activities are safe for you.  Wear  compression stockings if recommended by your health care provider.  Keep all follow-up visits as told by your health care provider. This is important. How is this prevented? To lower your risk of developing this condition again:  For 30 or more minutes every day, do an activity that: ? Involves moving your arms and legs. ? Increases your heart rate.  When traveling for longer than four hours: ? Exercise your arms and legs every hour. ? Drink plenty of water. ? Avoid drinking alcohol.  Avoid sitting or lying for a long time without moving your legs.  Stay a healthy weight.  If you are a woman who is older than age 53, avoid unnecessary use of medicines that contain estrogen.  Do not use any products that contain nicotine or tobacco, such as cigarettes and e-cigarettes. This is especially important if you take estrogen medicines. If you need help quitting, ask your health care provider.  Contact a health care provider if:  You miss a dose of your blood thinner.  You have nausea, vomiting, or diarrhea that lasts for more than one day.  Your menstrual period is heavier than usual.  You have unusual bruising. Get help right away if:  You have new or increased pain, swelling, or redness in an arm or leg.  You have numbness or tingling in an arm or leg.  You have shortness of breath.  You have chest pain.  You have a rapid or irregular heartbeat.  You feel light-headed or dizzy.  You cough up blood.  There is blood in your vomit, stool, or urine.  You have a serious fall or accident, or you hit your head.  You have a severe headache or confusion.  You have a cut that will not stop bleeding. These symptoms may represent a serious problem that is an emergency. Do not wait to see if the symptoms will go away. Get medical help right away. Call your local emergency services (911 in the [place] Do not drive yourself to the hospital. Summary  DVT is a condition in which a  blood clot forms in a deep vein, such as a lower leg, thigh, or arm vein.  Symptoms can include swelling, warmth, pain, and redness in your leg or arm.  Treatment may include taking blood thinners, injecting medicine that dissolves blood clots,wearing compression stockings, or surgery.  If you are prescribed blood thinners, take them exactly as told. This information is not intended to replace advice given to you by your health care provider. Make sure you discuss any questions you have with your health care provider. Document Released: 05/25/2005 Document Revised: 06/27/2016 Document Reviewed: 06/27/2016 Elsevier Interactive Patient Education  2018 [employer].

## 2017-12-07 NOTE — Progress Notes (Signed)
Subjective:  [name], male    DOB: 1955/08/25  Age: 62 y.o. MRN: [id number]  CC: hospital f/u  HPI Albert Harris is a 62 y.o. male with a medical history of HTN, CVA, tobacco abuse, and asthma presents as a new patient for hospital f/u for [name]. Admitted 11/09/17 and discharged 11/11/17. initial CT head and MRI / MRA head negative for acute stroke. Echocardiogram showed aneurysmal IAS with PFO with cardiology recommending a TEE. Carotid duplex without significant stenosis. Pt states he is taking medications as directed. Feels fatigued and feels right popliteal pain with increased activities or when walking short distances. Relieved when he sits. Says breathing is "ok", no difficulties. Taking medications as directed. Does not endorse CP, palpitations, SOB, HA, tingling, numbness, abdominal pain, f/c/n/v/, rash, or GI/GU sxs. Has not been able to see neurology or cardiology due to lack of funds. Interested in applying for [facility].          Outpatient Medications Prior to Visit  Medication Sig Dispense Refill  . amLODipine (NORVASC) 10 MG tablet Take 1 tablet (10 mg total) by mouth daily. 30 tablet 0  . aspirin 325 MG tablet Take 1 tablet (325 mg total) by mouth daily. 30 tablet 1  . atorvastatin (LIPITOR) 40 MG tablet Take 1 tablet (40 mg total) by mouth daily at 6 PM. 30 tablet 2  . metoprolol tartrate (LOPRESSOR) 25 MG tablet Take 1 tablet (25 mg total) by mouth 2 (two) times daily. 60 tablet 0  . nicotine (NICODERM CQ - DOSED IN MG/24 HOURS) 14 mg/24hr patch Place 1 patch (14 mg total) onto the skin daily. 28 patch 0  . trolamine salicylate (ASPERCREME) 10 % cream Apply 1 application topically as needed for muscle pain.     No facility-administered medications prior to visit.      ROS Review of Systems  Constitutional: Negative for chills, fever and malaise/fatigue.  Eyes: Negative for blurred vision.  Respiratory: Negative for shortness of breath.   Cardiovascular:  Negative for chest pain and palpitations.  Gastrointestinal: Negative for abdominal pain and nausea.  Genitourinary: Negative for dysuria and hematuria.  Musculoskeletal: Negative for joint pain and myalgias.       Right popliteal pain  Skin: Negative for rash.  Neurological: Negative for tingling and headaches.  Psychiatric/Behavioral: Negative for depression. The patient is not nervous/anxious.     Objective:  BP 133/72 (BP Location: Left Arm, Patient Position: Sitting, Cuff Size: Normal)   Pulse 62   Temp 97.9 F (36.6 C) (Oral)   Resp 18   Ht 5\' 6"  (1.676 m)   Wt 143 lb (64.9 kg)   SpO2 99%   BMI 23.08 kg/m   BP/Weight 12/07/2017 07/12/4973 3/0/0511  Systolic BP 021 117 -  Diastolic BP 72 93 -  Wt. (Lbs) 143 - 141.76  BMI 23.08 - 20.93      Physical Exam  Constitutional: He is oriented to person, place, and time.  Well developed, well nourished, NAD, polite  HENT:  Head: Normocephalic and atraumatic.  Eyes: No scleral icterus.  Neck: Normal range of motion. Neck supple. No thyromegaly present.  Cardiovascular: Normal rate, normal heart sounds and intact distal pulses. Exam reveals no gallop and no friction rub.  No murmur heard. Slight pause with inspiration  Pulmonary/Chest: Effort normal and breath sounds normal.  Abdominal: Soft. Bowel sounds are normal. There is no tenderness.  Musculoskeletal: He exhibits no edema.  Neurological: He is alert and oriented  to person, place, and time.  Skin: Skin is warm and dry. No rash noted. No erythema. No pallor.  Psychiatric: He has a normal mood and affect. His behavior is normal. Thought content normal.  Vitals reviewed.    Assessment & Plan:    1. Hospital discharge follow-up - Basic Metabolic Panel - CBC with Differential  2. [name] (transient ischemic attack) - aspirin 325 MG tablet; Take 1 tablet (325 mg total) by mouth daily.  Dispense: 90 tablet; Refill: 3 - Will need to apply to [facility] before neurology referral  can be made. Pt unable to afford self pay at this time.   3. Right leg pain - Suspect claudication vs DVT - Basic Metabolic Panel - CBC with Differential -  Scheduled for 11:00 a.m. Tomorrow. VAS [place] LOWER EXTREMITY VENOUS (DVT); Future  4. Elevated lipoprotein(a) - Refill atorvastatin (LIPITOR) 40 MG tablet; Take 1 tablet (40 mg total) by mouth daily at 6 PM.  Dispense: 30 tablet; Refill: 5  5. Screening for colon cancer - Fecal occult blood, imunochemical  6. Tobacco use disorder - Refill nicotine (NICODERM CQ - DOSED IN MG/24 HOURS) 14 mg/24hr patch; Place 1 patch (14 mg total) onto the skin daily.  Dispense: 28 patch; Refill: 0  7. Essential hypertension - Refill metoprolol tartrate (LOPRESSOR) 25 MG tablet; Take 1 tablet (25 mg total) by mouth 2 (two) times daily.  Dispense: 60 tablet; Refill: 5 - Refill amLODipine (NORVASC) 10 MG tablet; Take 1 tablet (10 mg total) by mouth daily.  Dispense: 30 tablet; Refill: 5  8. Abnormal echocardiogram - Echocardiogram showed aneurysmal IAS with PFO - Will need to apply to [facility] before cardiology referral can be made. Pt unable to afford self pay at this time.    Meds ordered this encounter  Medications  . nicotine (NICODERM CQ - DOSED IN MG/24 HOURS) 14 mg/24hr patch    Sig: Place 1 patch (14 mg total) onto the skin daily.    Dispense:  28 patch    Refill:  0    Order Specific Question:   Supervising Provider    Answer:   Charlott Rakes [[id number]]  . metoprolol tartrate (LOPRESSOR) 25 MG tablet    Sig: Take 1 tablet (25 mg total) by mouth 2 (two) times daily.    Dispense:  60 tablet    Refill:  5    Order Specific Question:   Supervising Provider    Answer:   Charlott Rakes [[id number]]  . atorvastatin (LIPITOR) 40 MG tablet    Sig: Take 1 tablet (40 mg total) by mouth daily at 6 PM.    Dispense:  30 tablet    Refill:  5    Order Specific Question:   Supervising Provider    Answer:   Charlott Rakes [[id number]]  . aspirin 325 MG tablet     Sig: Take 1 tablet (325 mg total) by mouth daily.    Dispense:  90 tablet    Refill:  3    Order Specific Question:   Supervising Provider    Answer:   Charlott Rakes [[id number]]  . amLODipine (NORVASC) 10 MG tablet    Sig: Take 1 tablet (10 mg total) by mouth daily.    Dispense:  30 tablet    Refill:  5    Order Specific Question:   Supervising Provider    Answer:   Charlott Rakes [[id number]]    Follow-up: Return in about 1 month (around 01/04/2018) for f/u [name].   Roger  Roderic Ovens PA

## 2017-12-08 ENCOUNTER — Ambulatory Visit (HOSPITAL_COMMUNITY)
Admission: RE | Admit: 2017-12-08 | Discharge: 2017-12-08 | Disposition: A | Discharge status: Home / Self Care | Payer: Self-pay | Source: Ambulatory Visit | Attending: Physician Assistant | Admitting: Physician Assistant

## 2017-12-08 DIAGNOSIS — M79604 Pain in right leg: Secondary | ICD-10-CM | POA: Insufficient documentation

## 2017-12-08 LAB — CBC WITH DIFFERENTIAL/PLATELET
Basophils Absolute: 0.1 10*3/uL (ref 0.0–0.2)
Basos: 1 %
EOS (ABSOLUTE): 0.4 10*3/uL (ref 0.0–0.4)
EOS: 4 %
HEMATOCRIT: 42.4 % (ref 37.5–51.0)
HEMOGLOBIN: 14.5 g/dL (ref 13.0–17.7)
IMMATURE GRANS (ABS): 0 10*3/uL (ref 0.0–0.1)
Immature Granulocytes: 0 %
Lymphocytes Absolute: 3 10*3/uL (ref 0.7–3.1)
Lymphs: 28 %
MCH: 31.3 pg (ref 26.6–33.0)
MCHC: 34.2 g/dL (ref 31.5–35.7)
MCV: 92 fL (ref 79–97)
MONOCYTES: 5 %
Monocytes Absolute: 0.6 10*3/uL (ref 0.1–0.9)
NEUTROS PCT: 62 %
Neutrophils Absolute: 6.7 10*3/uL (ref 1.4–7.0)
Platelets: 338 10*3/uL (ref 150–450)
RBC: 4.63 x10E6/uL (ref 4.14–5.80)
RDW: 13.2 % (ref 12.3–15.4)
WBC: 10.8 10*3/uL (ref 3.4–10.8)

## 2017-12-08 LAB — BASIC METABOLIC PANEL
BUN / CREAT RATIO: 20 (ref 10–24)
BUN: 26 mg/dL (ref 8–27)
CO2: 23 mmol/L (ref 20–29)
CREATININE: 1.32 mg/dL — AB (ref 0.76–1.27)
Calcium: 9.5 mg/dL (ref 8.6–10.2)
Chloride: 105 mmol/L (ref 96–106)
GFR calc Af Amer: 66 mL/min/{1.73_m2} (ref >59)
GFR, EST NON AFRICAN AMERICAN: 57 mL/min/{1.73_m2} — AB (ref >59)
GLUCOSE: 105 mg/dL — AB (ref 65–99)
Potassium: 4.6 mmol/L (ref 3.5–5.2)
Sodium: 140 mmol/L (ref 134–144)

## 2017-12-08 NOTE — Progress Notes (Signed)
Right lower extremity venous duplex has been completed. Negative for DVT. Results were given to Domenica Fail PA.   12/08/17 11:06 AM Carlos Levering RVT

## 2017-12-10 ENCOUNTER — Telehealth (INDEPENDENT_AMBULATORY_CARE_PROVIDER_SITE_OTHER): Payer: Self-pay

## 2017-12-10 NOTE — Telephone Encounter (Signed)
-----   Message from Clent Demark, PA-C sent at 12/10/2017  8:43 AM EDT ----- There is no evidence of deep vein thrombosis in the lower extremity. No cystic structure found in the popliteal fossa. It is likely that he has claudication which is a result of poor circulation from an inefficient heart.

## 2017-12-10 NOTE — Telephone Encounter (Signed)
Patient is aware that there is  No evidence of DVT in the lower extremity. No cystic structure found in the popliteal fossa. Likely that he has claudication which is the result of poor circulation from an inefficient heart. Also aware of stable Chronic Kidney disease. Patient expressed understanding. Nat Christen, CMA

## 2018-01-04 ENCOUNTER — Ambulatory Visit (INDEPENDENT_AMBULATORY_CARE_PROVIDER_SITE_OTHER): Payer: Self-pay | Admitting: Physician Assistant

## 2018-01-04 ENCOUNTER — Other Ambulatory Visit: Payer: Self-pay

## 2018-01-04 ENCOUNTER — Encounter (INDEPENDENT_AMBULATORY_CARE_PROVIDER_SITE_OTHER): Payer: Self-pay | Admitting: Physician Assistant

## 2018-01-04 VITALS — BP 179/89 | HR 73 | Temp 97.9°F | Ht 66.0 in | Wt 151.6 lb

## 2018-01-04 DIAGNOSIS — G459 Transient cerebral ischemic attack, unspecified: Secondary | ICD-10-CM

## 2018-01-04 DIAGNOSIS — Z9119 Patient's noncompliance with other medical treatment and regimen: Secondary | ICD-10-CM

## 2018-01-04 DIAGNOSIS — I739 Peripheral vascular disease, unspecified: Secondary | ICD-10-CM

## 2018-01-04 DIAGNOSIS — R5383 Other fatigue: Secondary | ICD-10-CM

## 2018-01-04 DIAGNOSIS — Z720 Tobacco use: Secondary | ICD-10-CM

## 2018-01-04 DIAGNOSIS — I10 Essential (primary) hypertension: Secondary | ICD-10-CM

## 2018-01-04 DIAGNOSIS — Z91199 Patient's noncompliance with other medical treatment and regimen due to unspecified reason: Secondary | ICD-10-CM

## 2018-01-04 MED ORDER — ASPIRIN 325 MG PO TABS: 325.0000 mg | ORAL_TABLET | Freq: Every day | ORAL | 3 refills | Status: DC

## 2018-01-04 MED ORDER — NICOTINE 21 MG/24HR TD PT24: 21.0000 mg | MEDICATED_PATCH | Freq: Every day | TRANSDERMAL | 0 refills | Status: DC

## 2018-01-04 NOTE — Patient Instructions (Addendum)
Intermittent Claudication Intermittent claudication is pain in your leg that occurs when you walk or exercise and goes away when you rest. The pain can occur in one or both legs. What are the causes? Intermittent claudication is caused by the buildup of plaque within the major arteries in the body (atherosclerosis). The plaque, which makes arteries stiff and narrow, prevents enough blood from reaching your leg muscles. The pain occurs when you walk or exercise because your muscles need more blood when you are moving and exercising. What increases the risk? Risk factors include:  A family history of atherosclerosis.  A personal history of stroke or heart disease.  Older age.  Being inactive or overweight.  Smoking cigarettes.  Having another health condition such as: ? Diabetes. ? High blood pressure. ? High cholesterol.  What are the signs or symptoms? Your hip or leg may:  Ache.  Cramp.  Feel tight.  Feel weak.  Feel heavy.  Over time, you may feel pain in your calf, thigh, or hip. How is this diagnosed? Your health care provider may diagnose intermittent claudication based on your symptoms and medical history. Your health care provider may also do tests to learn more about your condition. These may include:  Blood tests.  An ultrasound.  Imaging tests such as angiography, magnetic resonance angiography (MRA), and computed tomography angiography (CTA).  How is this treated? You may be treated for problems such as:  High blood pressure.  High cholesterol.  Diabetes.  Other treatments may include:  Lifestyle changes such as: ? Starting an exercise program. ? Losing weight. ? Quitting smoking.  Medicines to help restore blood flow through your legs.  Blood vessel surgery (angioplasty) to restore blood flow if your intermittent claudication is caused by severe peripheral artery disease.  Follow these instructions at home:  Manage any other health  conditions you have.  Eat a diet low in saturated fats and calories to maintain a healthy weight.  Quit smoking, if you smoke.  Take medicines only as directed by your health care provider.  If your health care provider recommended an exercise program for you, follow it as directed. Your exercise program may involve: ? Walking three or more times a week. ? Walking until you have certain symptoms of intermittent claudication. ? Resting until symptoms go away. ? Gradually increasing walking time to about 50 minutes a day. Contact a health care provider if: Your condition is not getting better or is getting worse. Get help right away if:  You have chest pain.  You have difficulty breathing.  You develop arm weakness.  You have trouble speaking.  Your face begins to droop. This information is not intended to replace advice given to you by your health care provider. Make sure you discuss any questions you have with your health care provider. Document Released: 03/27/2004 Document Revised: 10/31/2015 Document Reviewed: 08/31/2013 Elsevier Interactive Patient Education  2017 [employer]    Transient Ischemic Attack A transient ischemic attack ([name]) is a "warning stroke" that causes stroke-like symptoms. A [name] does not cause lasting damage to the brain. The symptoms of a [name] can happen fast and do not last long. It is important to know the symptoms of a [name] and what to do. This can help prevent stroke or death. Follow these instructions at home:  Take medicines only as told by your doctor. Make sure you understand all of the instructions.  You may need to take aspirin or warfarin medicine. Warfarin needs to be  taken exactly as told. ? Taking too much or too little warfarin is dangerous. Blood tests must be done as often as told by your doctor. A PT blood test measures how long it takes for blood to clot. Your PT is used to calculate another value called an INR. Your PT and INR help  your doctor adjust your warfarin dosage. He or she will make sure you are taking the right amount. ? Food can cause problems with warfarin and affect the results of your blood tests. This is true for foods high in vitamin K. Eat the same amount of foods high in vitamin K each day. Foods high in vitamin K include spinach, kale, broccoli, cabbage, collard and turnip greens, Brussels sprouts, peas, cauliflower, seaweed, and parsley. Other foods high in vitamin K include beef and pork liver, green tea, and soybean oil. Eat the same amount of foods high in vitamin K each day. Avoid big changes in your diet. Tell your doctor before changing your diet. Talk to a food specialist (dietitian) if you have questions. ? Many medicines can cause problems with warfarin and affect your PT and INR. Tell your doctor about all medicines you take. This includes vitamins and dietary pills (supplements). Do not take or stop taking any prescribed or over-the-counter medicines unless your doctor tells you to. ? Warfarin can cause more bruising or bleeding. Hold pressure over any cuts for longer than normal. Talk to your doctor about other side effects of warfarin. ? Avoid sports or activities that may cause injury or bleeding. ? Be careful when you shave, floss, or use sharp objects. ? Avoid or drink very little alcohol while taking warfarin. Tell your doctor if you change how much alcohol you drink. ? Tell your dentist and other doctors that you take warfarin before any procedures.  Follow your diet program as told, if you are given one.  Keep a healthy weight.  Stay active. Try to get at least 30 minutes of activity on all or most days.  Do not use any tobacco products, including cigarettes, chewing tobacco, or electronic cigarettes. If you need help quitting, ask your doctor.  Limit alcohol intake to no more than 1 drink per day for nonpregnant women and 2 drinks per day for men. One drink equals 12 ounces of beer, 5  ounces of wine, or 1 ounces of hard liquor.  Do not abuse drugs.  Keep your home safe so you do not fall. You can do this by: ? Putting grab bars in the bedroom and bathroom. ? Raising toilet seats. ? Putting a seat in the shower.  Keep all follow-up visits as told by your doctor. This is important. Contact a doctor if:  Your personality changes.  You have trouble swallowing.  You have double vision.  You are dizzy.  You have a fever. Get help right away if: These symptoms may be an emergency. Do not wait to see if the symptoms will go away. Get medical help right away. Call your local emergency services (911 in the [place] Do not drive yourself to the hospital.  You have sudden weakness or lose feeling (go numb), especially on one side of the body. This can affect your: ? Face. ? Arm. ? Leg.  You have sudden trouble walking.  You have sudden trouble moving your arms or legs.  You have sudden confusion.  You have trouble talking.  You have trouble understanding.  You have sudden trouble seeing in one or both eyes.  You lose your balance.  Your movements are not smooth.  You have a sudden, very bad headache with no known cause.  You have new chest pain.  Your heartbeat is unsteady.  You are partly or totally unaware of what is going on around you.  This information is not intended to replace advice given to you by your health care provider. Make sure you discuss any questions you have with your health care provider. Document Released: 03/03/2008 Document Revised: 01/27/2016 Document Reviewed: 08/30/2013 Elsevier Interactive Patient Education  [employer].

## 2018-01-04 NOTE — Progress Notes (Signed)
Subjective:  [name], male    DOB: 1956-04-09  Age: 62 y.o. MRN: [id number]  CC: f/u [name]  HPI Albert Harris is a 62 y.o. male with a medical history of HTN, HLD, CVA, tobacco abuse, and asthma presents on f/u of [name] and HTN. Admitted 11/09/17 and discharged 11/11/17. Initial CT head and MRI / MRA head negative for acute stroke. 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 Carotid duplex without significant stenosis.     Pt complains of fatigue and feels right popliteal pain and right hip pain with increased activities or when walking short distances. [place] of RLE conducted on 12/08/17 revealed no DVT or cystic structure. Pain thought to be caused by claudication. Pain relieved when he sits to rest. Continues smoking but has reduced to one pack per day. Thinks Nicotine patch prescribed at last visit is not as effective as the patch given at the hospital.  Says breathing is "ok", no difficulties. Usually taking anti-hypertensive medications as directed. Forgot to take last night and this morning. Does not endorse CP, palpitations, SOB, HA, tingling, numbness, abdominal pain, f/c/n/v/, rash, or GI/GU sxs. Has not been able to see neurology or cardiology due to lack of funds. Has not applied to [facility] yet.      Outpatient Medications Prior to Visit  Medication Sig Dispense Refill  . amLODipine (NORVASC) 10 MG tablet Take 1 tablet (10 mg total) by mouth daily. 30 tablet 5  . aspirin 325 MG tablet Take 1 tablet (325 mg total) by mouth daily. 90 tablet 3  . atorvastatin (LIPITOR) 40 MG tablet Take 1 tablet (40 mg total) by mouth daily at 6 PM. 30 tablet 5  . metoprolol tartrate (LOPRESSOR) 25 MG tablet Take 1 tablet (25 mg total) by mouth 2 (two) times daily. 60 tablet 5  . trolamine salicylate (ASPERCREME) 10 % cream Apply 1 application topically as needed for muscle pain.    . nicotine (NICODERM CQ - DOSED IN MG/24 HOURS) 14 mg/24hr patch Place 1 patch  (14 mg total) onto the skin daily. (Patient not taking: Reported on 01/04/2018) 28 patch 0   No facility-administered medications prior to visit.      ROS Review of Systems  Constitutional: Positive for malaise/fatigue. Negative for chills and fever.  Eyes: Negative for blurred vision.  Respiratory: Negative for shortness of breath.   Cardiovascular: Negative for chest pain and palpitations.  Gastrointestinal: Negative for abdominal pain and nausea.  Genitourinary: Negative for dysuria and hematuria.  Musculoskeletal: Negative for joint pain and myalgias.       Right hip and popliteal pain  Skin: Negative for rash.  Neurological: Negative for tingling and headaches.  Psychiatric/Behavioral: Negative for depression. The patient is not nervous/anxious.     Objective:  BP (!) 179/89 (BP Location: Right Arm, Patient Position: Sitting, Cuff Size: Normal) Comment: patient has not had morning dose of metoprolol  Pulse 73   Temp 97.9 F (36.6 C) (Oral)   Ht 5\' 6"  (1.676 m)   Wt 151 lb 9.6 oz (68.8 kg)   SpO2 98%   BMI 24.47 kg/m   BP/Weight 01/04/2018 0/07/5425 0/11/2374  Systolic BP 283 151 761  Diastolic BP 89 72 93  Wt. (Lbs) 151.6 143 -  BMI 24.47 23.08 -      Physical Exam  Constitutional: He is oriented to person, place, and time.  Well developed, well nourished, NAD, polite  HENT:  Head: Normocephalic and  atraumatic.  Eyes: No scleral icterus.  Neck: Normal range of motion. Neck supple. No thyromegaly present.  Cardiovascular: Normal rate, regular rhythm, normal heart sounds and intact distal pulses. Exam reveals no gallop and no friction rub.  No murmur heard. Pulmonary/Chest: Effort normal and breath sounds normal. No respiratory distress. He has no wheezes. He has no rales.  Abdominal: Soft. Bowel sounds are normal. There is no tenderness.  Musculoskeletal: He exhibits no edema.  Mild right popliteal pain  Neurological: He is alert and oriented to person, place,  and time.  Skin: Skin is warm and dry. No rash noted. No erythema. No pallor.  Psychiatric: He has a normal mood and affect. His behavior is normal. Thought content normal.  Vitals reviewed.    Assessment & Plan:    1. [name] (transient ischemic attack) - Refill aspirin 325 MG tablet; Take 1 tablet (325 mg total) by mouth daily.  Dispense: 90 tablet; Refill: 3  2. Fatigue, unspecified type - Recent CBC, BMP, and A1c normal - TSH  3. Tobacco abuse - Increase nicotine (NICODERM CQ - DOSED IN MG/24 HOURS) 21 mg/24hr patch; Place 1 patch (21 mg total) onto the skin daily.  Dispense: 28 patch; Refill: 0  4. Hypertension, unspecified type - BP elevated. Has not taken medication last night or this morning.   5. Claudication ([place]) - Advised patient to control his BP, tobacco use, and cholesterol level. Advised to begin an exercise program. May need to send to Vascular for further evaluation.  6. Noncompliance - Advised to take medications as directed.      Meds ordered this encounter  Medications  . aspirin 325 MG tablet    Sig: Take 1 tablet (325 mg total) by mouth daily.    Dispense:  90 tablet    Refill:  3    Order Specific Question:   Supervising Provider    Answer:   Charlott Rakes [[id number]]  . nicotine (NICODERM CQ - DOSED IN MG/24 HOURS) 21 mg/24hr patch    Sig: Place 1 patch (21 mg total) onto the skin daily.    Dispense:  28 patch    Refill:  0    Order Specific Question:   Supervising Provider    Answer:   Charlott Rakes [[id number]]    Follow-up: Return in about 1 month (around 02/01/2018) for HTN.   Clent Demark PA

## 2018-01-05 ENCOUNTER — Telehealth (INDEPENDENT_AMBULATORY_CARE_PROVIDER_SITE_OTHER): Payer: Self-pay

## 2018-01-05 LAB — TSH: TSH: 3.66 u[IU]/mL (ref 0.450–4.500)

## 2018-01-05 NOTE — Telephone Encounter (Signed)
Patient aware of normal thyroid result. Nat Christen, CMA

## 2018-01-05 NOTE — Telephone Encounter (Signed)
-----   Message from Clent Demark, PA-C sent at 01/05/2018  8:46 AM EDT ----- Normal thyroid result.

## 2018-01-13 MED FILL — AMLODIPINE BESYLATE 10 MG T: 10 | 30 days supply | Qty: 30 | Fill #1

## 2018-01-13 MED FILL — METOPROLOL TARTRATE 25 MG T: 25 | 30 days supply | Qty: 60 | Fill #1

## 2018-01-13 MED FILL — ATORVASTATIN CALCIUM 40 MG: 40 | 30 days supply | Qty: 30 | Fill #1

## 2018-01-28 ENCOUNTER — Ambulatory Visit (INDEPENDENT_AMBULATORY_CARE_PROVIDER_SITE_OTHER): Payer: Self-pay | Admitting: Physician Assistant

## 2018-01-28 ENCOUNTER — Encounter (INDEPENDENT_AMBULATORY_CARE_PROVIDER_SITE_OTHER): Payer: Self-pay | Admitting: Physician Assistant

## 2018-01-28 ENCOUNTER — Other Ambulatory Visit: Payer: Self-pay

## 2018-01-28 VITALS — BP 159/84 | HR 58 | Temp 98.0°F | Ht 66.0 in | Wt 151.0 lb

## 2018-01-28 DIAGNOSIS — G8929 Other chronic pain: Secondary | ICD-10-CM

## 2018-01-28 DIAGNOSIS — Z23 Encounter for immunization: Secondary | ICD-10-CM

## 2018-01-28 DIAGNOSIS — I10 Essential (primary) hypertension: Secondary | ICD-10-CM

## 2018-01-28 DIAGNOSIS — M25561 Pain in right knee: Secondary | ICD-10-CM

## 2018-01-28 MED ORDER — HYDROCHLOROTHIAZIDE 25 MG PO TABS: 25.0000 mg | ORAL_TABLET | Freq: Every day | ORAL | 1 refills | Status: DC

## 2018-01-28 MED ORDER — ACETAMINOPHEN 500 MG PO TABS: 500.0000 mg | ORAL_TABLET | Freq: Three times a day (TID) | ORAL | 0 refills | Status: DC | PRN

## 2018-01-28 NOTE — Progress Notes (Signed)
Subjective:  [name], male    DOB: 1955/10/21  Age: 62 y.o. MRN: [id number]  CC: f/u HTN   HPI  [name] a 62 y.o.malewith a medical history of HTN, HLD, CVA, tobacco abuse, and asthma presents on f/u of [name] and HTN. Last seen three weeks ago. Echocardiogram showed a neurysmal IAS with PFOwith cardiology recommending a TEE. Has not gotten TEE done yet due to financial reasons and lack of insurance. Last BP 179/89 mmHg. BP 159/84 mmHg today. Taking all medications as directed. Endorses chronic right knee pain. 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 Has taken OTC NSAIDs with minimal and temporary relief. Has not seen ortho nor had imaging done. Does not endorse CP, palpitations, SOB, HA, tingling, numbness, weakness, edema, abdominal pain, f/c/n/v, rash, or GI/GU sxs.         Outpatient Medications Prior to Visit  Medication Sig Dispense Refill  . amLODipine (NORVASC) 10 MG tablet Take 1 tablet (10 mg total) by mouth daily. 30 tablet 5  . aspirin 325 MG tablet Take 1 tablet (325 mg total) by mouth daily. 90 tablet 3  . atorvastatin (LIPITOR) 40 MG tablet Take 1 tablet (40 mg total) by mouth daily at 6 PM. 30 tablet 5  . metoprolol tartrate (LOPRESSOR) 25 MG tablet Take 1 tablet (25 mg total) by mouth 2 (two) times daily. 60 tablet 5  . trolamine salicylate (ASPERCREME) 10 % cream Apply 1 application topically as needed for muscle pain.    . nicotine (NICODERM CQ - DOSED IN MG/24 HOURS) 21 mg/24hr patch Place 1 patch (21 mg total) onto the skin daily. (Patient not taking: Reported on 01/28/2018) 28 patch 0   No facility-administered medications prior to visit.      ROS Review of Systems  Constitutional: Negative for chills, fever and malaise/fatigue.  Eyes: Negative for blurred vision.  Respiratory: Negative for shortness of breath.   Cardiovascular: Negative for chest  pain and palpitations.  Gastrointestinal: Negative for abdominal pain and nausea.  Genitourinary: Negative for dysuria and hematuria.  Musculoskeletal: Negative for joint pain and myalgias.  Skin: Negative for rash.  Neurological: Negative for tingling and headaches.  Psychiatric/Behavioral: Negative for depression. The patient is not nervous/anxious.     Objective:  BP (!) 159/84 (BP Location: Left Arm, Patient Position: Sitting, Cuff Size: Normal)   Pulse (!) 58   Temp 98 F (36.7 C) (Oral)   Ht 5\' 6"  (1.676 m)   Wt 151 lb (68.5 kg)   SpO2 98%   BMI 24.37 kg/m   BP/Weight 01/28/2018 07/08/8655 01/09/6961  Systolic BP 952 841 324  Diastolic BP 84 89 72  Wt. (Lbs) 151 151.6 143  BMI 24.37 24.47 23.08      Physical Exam  Constitutional: He is oriented to person, place, and time.  Well developed, well nourished, NAD, polite  HENT:  Head: Normocephalic and atraumatic.  Eyes: No scleral icterus.  Neck: Normal range of motion. Neck supple. No thyromegaly present.  Cardiovascular: Normal rate, regular rhythm and normal heart sounds.  Pulmonary/Chest: Effort normal and breath sounds normal.  Abdominal: Soft. Bowel sounds are normal. There is no tenderness.  Musculoskeletal: He exhibits no edema.  Right knee with TTP proximal to the patella. Full aROM of right knee.   Neurological: He is alert and oriented to person, place, and time.  Skin: Skin is warm and dry.  No rash noted. No erythema. No pallor.  Psychiatric: He has a normal mood and affect. His behavior is normal. Thought content normal.  Vitals reviewed.    Assessment & Plan:   1. Essential hypertension - Begin hydrochlorothiazide (HYDRODIURIL) 25 MG tablet; Take 1 tablet (25 mg total) by mouth daily. Take on tablet in the morning.  Dispense: 90 tablet; Refill: 1. Slight re - Continue Amlodipine and Metoprolol as directed.   2. Need for prophylactic vaccination and inoculation against influenza - Flu Vaccine QUAD 6+  mos PF IM (Fluarix Quad PF)  3. Chronic pain of right knee - DG Knee Complete 4 Views Right; Future - Begin acetaminophen (TYLENOL) 500 MG tablet; Take 1 tablet (500 mg total) by mouth every 8 (eight) hours as needed.  Dispense: 90 tablet; Refill: 0   Meds ordered this encounter  Medications  . hydrochlorothiazide (HYDRODIURIL) 25 MG tablet    Sig: Take 1 tablet (25 mg total) by mouth daily. Take on tablet in the morning.    Dispense:  90 tablet    Refill:  1    Order Specific Question:   Supervising Provider    Answer:   Charlott Rakes [[id number]]  . acetaminophen (TYLENOL) 500 MG tablet    Sig: Take 1 tablet (500 mg total) by mouth every 8 (eight) hours as needed.    Dispense:  90 tablet    Refill:  0    Order Specific Question:   Supervising Provider    Answer:   Charlott Rakes [[id number]]    Follow-up: Return in about 6 weeks (around 03/11/2018).   Clent Demark PA

## 2018-01-28 NOTE — Patient Instructions (Signed)
DASH Eating Plan DASH stands for "Dietary Approaches to Stop Hypertension." The DASH eating plan is a healthy eating plan that has been shown to reduce high blood pressure (hypertension). It may also reduce your risk for type 2 diabetes, heart disease, and stroke. The DASH eating plan may also help with weight loss. What are tips for following this plan? General guidelines  Avoid eating more than 2,300 mg (milligrams) of salt (sodium) a day. If you have hypertension, you may need to reduce your sodium intake to 1,500 mg a day.  Limit alcohol intake to no more than 1 drink a day for nonpregnant women and 2 drinks a day for men. One drink equals 12 oz of beer, 5 oz of wine, or 1 oz of hard liquor.  Work with your health care provider to maintain a healthy body weight or to lose weight. Ask what an ideal weight is for you.  Get at least 30 minutes of exercise that causes your heart to beat faster (aerobic exercise) most days of the week. Activities may include walking, swimming, or biking.  Work with your health care provider or diet and nutrition specialist (dietitian) to adjust your eating plan to your individual calorie needs. Reading food labels  Check food labels for the amount of sodium per serving. Choose foods with less than 5 percent of the Daily Value of sodium. Generally, foods with less than 300 mg of sodium per serving fit into this eating plan.  To find whole grains, look for the word "whole" as the first word in the ingredient list. Shopping  Buy products labeled as "low-sodium" or "no salt added."  Buy fresh foods. Avoid canned foods and premade or frozen meals. Cooking  Avoid adding salt when cooking. Use salt-free seasonings or herbs instead of table salt or sea salt. Check with your health care provider or pharmacist before using salt substitutes.  Do not fry foods. Cook foods using healthy methods such as baking, boiling, grilling, and broiling instead.  Cook with  heart-healthy oils, such as olive, canola, soybean, or sunflower oil. Meal planning   Eat a balanced diet that includes: ? 5 or more servings of fruits and vegetables each day. At each meal, try to fill half of your plate with fruits and vegetables. ? Up to 6-8 servings of whole grains each day. ? Less than 6 oz of lean meat, poultry, or fish each day. A 3-oz serving of meat is about the same size as a deck of cards. One egg equals 1 oz. ? 2 servings of low-fat dairy each day. ? A serving of nuts, seeds, or beans 5 times each week. ? Heart-healthy fats. Healthy fats called Omega-3 fatty acids are found in foods such as flaxseeds and coldwater fish, like sardines, salmon, and mackerel.  Limit how much you eat of the following: ? Canned or prepackaged foods. ? Food that is high in trans fat, such as fried foods. ? Food that is high in saturated fat, such as fatty meat. ? Sweets, desserts, sugary drinks, and other foods with added sugar. ? Full-fat dairy products.  Do not salt foods before eating.  Try to eat at least 2 vegetarian meals each week.  Eat more home-cooked food and less restaurant, buffet, and fast food.  When eating at a restaurant, ask that your food be prepared with less salt or no salt, if possible. What foods are recommended? The items listed may not be a complete list. Talk with your dietitian about what   dietary choices are best for you. Grains Whole-grain or whole-wheat bread. Whole-grain or whole-wheat pasta. Brown rice. Oatmeal. Quinoa. Bulgur. Whole-grain and low-sodium cereals. Pita bread. Low-fat, low-sodium crackers. Whole-wheat flour tortillas. Vegetables Fresh or frozen vegetables (raw, steamed, roasted, or grilled). Low-sodium or reduced-sodium tomato and vegetable juice. Low-sodium or reduced-sodium tomato sauce and tomato paste. Low-sodium or reduced-sodium canned vegetables. Fruits All fresh, dried, or frozen fruit. Canned fruit in natural juice (without  added sugar). Meat and other protein foods Skinless chicken or turkey. Ground chicken or turkey. Pork with fat trimmed off. Fish and seafood. Egg whites. Dried beans, peas, or lentils. Unsalted nuts, nut butters, and seeds. Unsalted canned beans. Lean cuts of beef with fat trimmed off. Low-sodium, lean deli meat. Dairy Low-fat (1%) or fat-free (skim) milk. Fat-free, low-fat, or reduced-fat cheeses. Nonfat, low-sodium ricotta or cottage cheese. Low-fat or nonfat yogurt. Low-fat, low-sodium cheese. Fats and oils Soft margarine without trans fats. Vegetable oil. Low-fat, reduced-fat, or light mayonnaise and salad dressings (reduced-sodium). Canola, safflower, olive, soybean, and sunflower oils. Avocado. Seasoning and other foods Herbs. Spices. Seasoning mixes without salt. Unsalted popcorn and pretzels. Fat-free sweets. What foods are not recommended? The items listed may not be a complete list. Talk with your dietitian about what dietary choices are best for you. Grains Baked goods made with fat, such as croissants, muffins, or some breads. Dry pasta or rice meal packs. Vegetables Creamed or fried vegetables. Vegetables in a cheese sauce. Regular canned vegetables (not low-sodium or reduced-sodium). Regular canned tomato sauce and paste (not low-sodium or reduced-sodium). Regular tomato and vegetable juice (not low-sodium or reduced-sodium). Pickles. Olives. Fruits Canned fruit in a light or heavy syrup. Fried fruit. Fruit in cream or butter sauce. Meat and other protein foods Fatty cuts of meat. Ribs. Fried meat. Bacon. Sausage. Bologna and other processed lunch meats. Salami. Fatback. Hotdogs. Bratwurst. Salted nuts and seeds. Canned beans with added salt. Canned or smoked fish. Whole eggs or egg yolks. Chicken or turkey with skin. Dairy Whole or 2% milk, cream, and half-and-half. Whole or full-fat cream cheese. Whole-fat or sweetened yogurt. Full-fat cheese. Nondairy creamers. Whipped toppings.  Processed cheese and cheese spreads. Fats and oils Butter. Stick margarine. Lard. Shortening. Ghee. Bacon fat. Tropical oils, such as coconut, palm kernel, or palm oil. Seasoning and other foods Salted popcorn and pretzels. Onion salt, garlic salt, seasoned salt, table salt, and sea salt. Worcestershire sauce. Tartar sauce. Barbecue sauce. Teriyaki sauce. Soy sauce, including reduced-sodium. Steak sauce. Canned and packaged gravies. Fish sauce. Oyster sauce. Cocktail sauce. Horseradish that you find on the shelf. Ketchup. Mustard. Meat flavorings and tenderizers. Bouillon cubes. Hot sauce and Tabasco sauce. Premade or packaged marinades. Premade or packaged taco seasonings. Relishes. Regular salad dressings. Where to find more information:  National Heart, Lung, and Blood Institute: www.nhlbi.nih.gov  American Heart Association: www.heart.org Summary  The DASH eating plan is a healthy eating plan that has been shown to reduce high blood pressure (hypertension). It may also reduce your risk for type 2 diabetes, heart disease, and stroke.  With the DASH eating plan, you should limit salt (sodium) intake to 2,300 mg a day. If you have hypertension, you may need to reduce your sodium intake to 1,500 mg a day.  When on the DASH eating plan, aim to eat more fresh fruits and vegetables, whole grains, lean proteins, low-fat dairy, and heart-healthy fats.  Work with your health care provider or diet and nutrition specialist (dietitian) to adjust your eating plan to your individual   calorie needs. This information is not intended to replace advice given to you by your health care provider. Make sure you discuss any questions you have with your health care provider. Document Released: 05/14/2011 Document Revised: 05/18/2016 Document Reviewed: 05/18/2016 Elsevier Interactive Patient Education  2018 [employer]  
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
## 2018-03-11 ENCOUNTER — Ambulatory Visit (INDEPENDENT_AMBULATORY_CARE_PROVIDER_SITE_OTHER): Payer: Self-pay | Admitting: Physician Assistant

## 2018-03-11 ENCOUNTER — Encounter (INDEPENDENT_AMBULATORY_CARE_PROVIDER_SITE_OTHER): Payer: Self-pay | Admitting: Physician Assistant

## 2018-03-11 ENCOUNTER — Other Ambulatory Visit: Payer: Self-pay

## 2018-03-11 VITALS — BP 213/103 | HR 74 | Temp 97.9°F | Ht 66.0 in | Wt 153.6 lb

## 2018-03-11 DIAGNOSIS — F17200 Nicotine dependence, unspecified, uncomplicated: Secondary | ICD-10-CM

## 2018-03-11 DIAGNOSIS — E7841 Elevated Lipoprotein(a): Secondary | ICD-10-CM

## 2018-03-11 DIAGNOSIS — I10 Essential (primary) hypertension: Secondary | ICD-10-CM

## 2018-03-11 MED ORDER — SIMVASTATIN 20 MG PO TABS: 20.0000 mg | ORAL_TABLET | Freq: Every day | ORAL | 5 refills | Status: DC

## 2018-03-11 MED ORDER — CLONIDINE HCL 0.1 MG PO TABS
0.2000 mg | ORAL_TABLET | Freq: Once | ORAL | Status: AC
  Administered 2018-03-11: 0.2 mg via ORAL

## 2018-03-11 MED ORDER — HYDROCHLOROTHIAZIDE 25 MG PO TABS: 25.0000 mg | ORAL_TABLET | Freq: Every day | ORAL | 5 refills | Status: DC

## 2018-03-11 MED ORDER — AMLODIPINE BESYLATE 10 MG PO TABS: 10.0000 mg | ORAL_TABLET | Freq: Every day | ORAL | 5 refills | Status: DC

## 2018-03-11 MED ORDER — METOPROLOL TARTRATE 25 MG PO TABS: 25.0000 mg | ORAL_TABLET | Freq: Two times a day (BID) | ORAL | 5 refills | Status: DC

## 2018-03-11 NOTE — Progress Notes (Signed)
Subjective:  [name], male    DOB: 05-26-1956  Age: 62 y.o. MRN: [id number]  CC: HTN and knee pain f/u  HPI Albert Harris a 61 y.o.malewith a medical history of HTN,HLD,CVA, tobacco abuse, and asthma presentson f/u of HTN and knee pain. Last BP 159/84 mmHg with HR of 58 bpm six weeks ago. Had been advised to continue Amlodipine and Metoprolol. Added HCTZ 25 mg. BP 232/121 mmHg today. Says he is not taking any of his anti-hypertensives because he could not afford them. Reports feeling well. Has an occasional mild headache. Still smoking a pack a day but would like to quit. Couldn't afford the Chantix that was written for at last visit. Pt is optimistic about being able to fill all medications now that he has started to receive social security checks. Denies CP, palpitations, SOB, abdominal pain, f/c/n/v, rash, swelling, or GI/GU sxs.     Outpatient Medications Prior to Visit  Medication Sig Dispense Refill  . acetaminophen (TYLENOL) 500 MG tablet Take 1 tablet (500 mg total) by mouth every 8 (eight) hours as needed. 90 tablet 0  . aspirin 325 MG tablet Take 1 tablet (325 mg total) by mouth daily. 90 tablet 3  . atorvastatin (LIPITOR) 40 MG tablet Take 1 tablet (40 mg total) by mouth daily at 6 PM. 30 tablet 5  . trolamine salicylate (ASPERCREME) 10 % cream Apply 1 application topically as needed for muscle pain.    Marland Kitchen amLODipine (NORVASC) 10 MG tablet Take 1 tablet (10 mg total) by mouth daily. (Patient not taking: Reported on 03/11/2018) 30 tablet 5  . hydrochlorothiazide (HYDRODIURIL) 25 MG tablet Take 1 tablet (25 mg total) by mouth daily. Take on tablet in the morning. (Patient not taking: Reported on 03/11/2018) 90 tablet 1  . metoprolol tartrate (LOPRESSOR) 25 MG tablet Take 1 tablet (25 mg total) by mouth 2 (two) times daily. (Patient not taking: Reported on 03/11/2018) 60 tablet 5  . nicotine (NICODERM CQ - DOSED IN MG/24 HOURS) 21 mg/24hr patch Place 1 patch (21  mg total) onto the skin daily. (Patient not taking: Reported on 01/28/2018) 28 patch 0   No facility-administered medications prior to visit.      ROS Review of Systems  Constitutional: Negative for chills, fever and malaise/fatigue.  Eyes: Negative for blurred vision.  Respiratory: Negative for shortness of breath.   Cardiovascular: Negative for chest pain and palpitations.  Gastrointestinal: Negative for abdominal pain and nausea.  Genitourinary: Negative for dysuria and hematuria.  Musculoskeletal: Negative for joint pain and myalgias.  Skin: Negative for rash.  Neurological: Negative for tingling and headaches.  Psychiatric/Behavioral: Negative for depression. The patient is not nervous/anxious.     Objective:  BP (!) 232/121 (BP Location: Left Arm, Patient Position: Sitting, Cuff Size: Normal)   Pulse 78   Temp 97.9 F (36.6 C) (Oral)   Ht 5\' 6"  (1.676 m)   Wt 153 lb 9.6 oz (69.7 kg)   SpO2 96%   BMI 24.79 kg/m   BP/Weight 03/11/2018 01/28/2018 01/24/2992  Systolic BP 716 967 893  Diastolic BP 810 84 89  Wt. (Lbs) 153.6 151 151.6  BMI 24.79 24.37 24.47      Physical Exam  Constitutional: He is oriented to person, place, and time.  Well developed, well nourished, NAD, polite  HENT:  Head: Normocephalic and atraumatic.  Eyes: No scleral icterus.  Neck: Normal range of motion. Neck supple. No thyromegaly present.  Cardiovascular: Normal rate, regular  rhythm and normal heart sounds.  Pulmonary/Chest: Effort normal and breath sounds normal.  Abdominal: Soft. Bowel sounds are normal. There is no tenderness.  Musculoskeletal: He exhibits no edema.  Neurological: He is alert and oriented to person, place, and time.  Skin: Skin is warm and dry. No rash noted. No erythema. No pallor.  Psychiatric: He has a normal mood and affect. His behavior is normal. Thought content normal.  Vitals reviewed.    Assessment & Plan:      1. Essential hypertension - Administered  cloNIDine (CATAPRES) tablet 0.2 mg - Advised to go to ED as we were not able to sufficiently lower his BP. - Begin metoprolol tartrate (LOPRESSOR) 25 MG tablet; Take 1 tablet (25 mg total) by mouth 2 (two) times daily.  Dispense: 60 tablet; Refill: 5 - Begin hydrochlorothiazide (HYDRODIURIL) 25 MG tablet; Take 1 tablet (25 mg total) by mouth daily. Take on tablet in the morning.  Dispense: 30 tablet; Refill: 5 - Begin amLODipine (NORVASC) 10 MG tablet; Take 1 tablet (10 mg total) by mouth daily.  Dispense: 30 tablet; Refill: 5  2. Tobacco use disorder - Pt unable to fill Chantix. Looked up Wellbutrin on [facility]'s $4 formulary and Wellbutrin would cost $9. Pt declines Wellbutrin at this time. Says he has some left over nicotine patches and will try them.   3. Elevated lipoprotein(a) - Begin simvastatin (ZOCOR) 20 MG tablet; Take 1 tablet (20 mg total) by mouth at bedtime.  Dispense: 30 tablet; Refill: 5. I have educated patient about the interaction between simvastatin and amlodipine. I have instructed him to stop simvastatin if he experiences muscle pains, back pains, dark urine, or any other worrisome symptoms.       Meds ordered this encounter  Medications  . cloNIDine (CATAPRES) tablet 0.2 mg  . metoprolol tartrate (LOPRESSOR) 25 MG tablet    Sig: Take 1 tablet (25 mg total) by mouth 2 (two) times daily.    Dispense:  60 tablet    Refill:  5    Order Specific Question:   Supervising Provider    Answer:   Charlott Rakes [[id number]]  . hydrochlorothiazide (HYDRODIURIL) 25 MG tablet    Sig: Take 1 tablet (25 mg total) by mouth daily. Take on tablet in the morning.    Dispense:  30 tablet    Refill:  5    Order Specific Question:   Supervising Provider    Answer:   Charlott Rakes [[id number]]  . amLODipine (NORVASC) 10 MG tablet    Sig: Take 1 tablet (10 mg total) by mouth daily.    Dispense:  30 tablet    Refill:  5    Order Specific Question:   Supervising Provider    Answer:   Charlott Rakes [[id number]]  . simvastatin (ZOCOR) 20 MG tablet    Sig: Take 1 tablet (20 mg total) by mouth at bedtime.    Dispense:  30 tablet    Refill:  5    Order Specific Question:   Supervising Provider    Answer:   Charlott Rakes [[id number]]    Follow-up: Return in about 4 weeks (around 04/08/2018) for HTN.   Clent Demark PA

## 2018-03-11 NOTE — Patient Instructions (Addendum)
I strongly advise that you go to the emergency room to have your blood pressure lowered gradually. You may go to the pharmacy to pick up your medications after emergency department discharge.   Managing Your Hypertension Hypertension is commonly called high blood pressure. This is when the force of your blood pressing against the walls of your arteries is too strong. Arteries are blood vessels that carry blood from your heart throughout your body. Hypertension forces the heart to work harder to pump blood, and may cause the arteries to become narrow or stiff. Having untreated or uncontrolled hypertension can cause heart attack, stroke, kidney disease, and other problems. What are blood pressure readings? A blood pressure reading consists of a higher number over a lower number. Ideally, your blood pressure should be below 120/80. The first ("top") number is called the systolic pressure. It is a measure of the pressure in your arteries as your heart beats. The second ("bottom") number is called the diastolic pressure. It is a measure of the pressure in your arteries as the heart relaxes. What does my blood pressure reading mean? Blood pressure is classified into four stages. Based on your blood pressure reading, your health care provider may use the following stages to determine what type of treatment you need, if any. Systolic pressure and diastolic pressure are measured in a unit called mm Hg. Normal  Systolic pressure: below 096.  Diastolic pressure: below 80. Elevated  Systolic pressure: 045-409.  Diastolic pressure: below 80. Hypertension stage 1  Systolic pressure: 811-914.  Diastolic pressure: 78-29. Hypertension stage 2  Systolic pressure: 562 or above.  Diastolic pressure: 90 or above. What health risks are associated with hypertension? Managing your hypertension is an important responsibility. Uncontrolled hypertension can lead to:  A heart attack.  A stroke.  A weakened  blood vessel (aneurysm).  Heart failure.  Kidney damage.  Eye damage.  Metabolic syndrome.  Memory and concentration problems.  What changes can I make to manage my hypertension? Hypertension can be managed by making lifestyle changes and possibly by taking medicines. Your health care provider will help you make a plan to bring your blood pressure within a normal range. Eating and drinking  Eat a diet that is high in fiber and potassium, and low in salt (sodium), added sugar, and fat. An example eating plan is called the DASH (Dietary Approaches to Stop Hypertension) diet. To eat this way: ? Eat plenty of fresh fruits and vegetables. Try to fill half of your plate at each meal with fruits and vegetables. ? Eat whole grains, such as whole wheat pasta, brown rice, or whole grain bread. Fill about one quarter of your plate with whole grains. ? Eat low-fat diary products. ? Avoid fatty cuts of meat, processed or cured meats, and poultry with skin. Fill about one quarter of your plate with lean proteins such as fish, chicken without skin, beans, eggs, and tofu. ? Avoid premade and processed foods. These tend to be higher in sodium, added sugar, and fat.  Reduce your daily sodium intake. Most people with hypertension should eat less than 1,500 mg of sodium a day.  Limit alcohol intake to no more than 1 drink a day for nonpregnant women and 2 drinks a day for men. One drink equals 12 oz of beer, 5 oz of wine, or 1 oz of hard liquor. Lifestyle  Work with your health care provider to maintain a healthy body weight, or to lose weight. Ask what an ideal weight is  for you.  Get at least 30 minutes of exercise that causes your heart to beat faster (aerobic exercise) most days of the week. Activities may include walking, swimming, or biking.  Include exercise to strengthen your muscles (resistance exercise), such as weight lifting, as part of your weekly exercise routine. Try to do these types of  exercises for 30 minutes at least 3 days a week.  Do not use any products that contain nicotine or tobacco, such as cigarettes and e-cigarettes. If you need help quitting, ask your health care provider.  Control any long-term (chronic) conditions you have, such as high cholesterol or diabetes. Monitoring  Monitor your blood pressure at home as told by your health care provider. Your personal target blood pressure may vary depending on your medical conditions, your age, and other factors.  Have your blood pressure checked regularly, as often as told by your health care provider. Working with your health care provider  Review all the medicines you take with your health care provider because there may be side effects or interactions.  Talk with your health care provider about your diet, exercise habits, and other lifestyle factors that may be contributing to hypertension.  Visit your health care provider regularly. Your health care provider can help you create and adjust your plan for managing hypertension. Will I need medicine to control my blood pressure? Your health care provider may prescribe medicine if lifestyle changes are not enough to get your blood pressure under control, and if:  Your systolic blood pressure is 130 or higher.  Your diastolic blood pressure is 80 or higher.  Take medicines only as told by your health care provider. Follow the directions carefully. Blood pressure medicines must be taken as prescribed. The medicine does not work as well when you skip doses. Skipping doses also puts you at risk for problems. Contact a health care provider if:  You think you are having a reaction to medicines you have taken.  You have repeated (recurrent) headaches.  You feel dizzy.  You have swelling in your ankles.  You have trouble with your vision. Get help right away if:  You develop a severe headache or confusion.  You have unusual weakness or numbness, or you feel  faint.  You have severe pain in your chest or abdomen.  You vomit repeatedly.  You have trouble breathing. Summary  Hypertension is when the force of blood pumping through your arteries is too strong. If this condition is not controlled, it may put you at risk for serious complications.  Your personal target blood pressure may vary depending on your medical conditions, your age, and other factors. For most people, a normal blood pressure is less than 120/80.  Hypertension is managed by lifestyle changes, medicines, or both. Lifestyle changes include weight loss, eating a healthy, low-sodium diet, exercising more, and limiting alcohol. This information is not intended to replace advice given to you by your health care provider. Make sure you discuss any questions you have with your health care provider. Document Released: 02/17/2012 Document Revised: 04/22/2016 Document Reviewed: 04/22/2016 Elsevier Interactive Patient Education  2018 [employer].    Tobacco Use Disorder Tobacco use disorder (TUD) is a mental disorder. It is the long-term use of tobacco in spite of related health problems or difficulty with normal life activities. Tobacco is most commonly smoked as cigarettes and less commonly as cigars or pipes. Smokeless chewing tobacco and snuff are also popular. People with TUD get a feeling of extreme pleasure (  euphoria) from using tobacco and have a desire to use it again and again. Repeated use of tobacco can cause problems. The addictive effects of tobacco are due mainly tothe ingredient nicotine. Nicotine also causes a rush of adrenaline (epinephrine) in the body. This leads to increased blood pressure, heart rate, and breathing rate. These changes may cause problems for people with high blood pressure, weak hearts, or lung disease. High doses of nicotine in children and pets can lead to seizures and death. Tobacco contains a number of other unsafe chemicals. These chemicals are  especially harmful when inhaled as smoke and can damage almost every organ in the body. Smokers live shorter lives than nonsmokers and are at risk of dying from a number of diseases and cancers. Tobacco smoke can also cause health problems for nonsmokers (due to inhaling secondhand smoke). Smoking is also a fire hazard. TUD usually starts in the late teenage years and is most common in young adults between the ages of 33 and 73 years. People who start smoking earlier in life are more likely to continue smoking as adults. TUD is somewhat more common in men than women. People with TUD are at higher risk for using alcohol and other drugs of abuse. What increases the risk? Risk factors for TUD include:  Having family members with the disorder.  Being around people who use tobacco.  Having an existing mental health issue such as schizophrenia, depression, bipolar disorder, ADHD, or posttraumatic stress disorder (PTSD).  What are the signs or symptoms? People with tobacco use disorder have two or more of the following signs and symptoms within 12 months:  Use of more tobacco over a longer period than intended.  Not able to cut down or control tobacco use.  A lot of time spent obtaining or using tobacco.  Strong desire or urge to use tobacco (craving). Cravings may last for 6 months or longer after quitting.  Use of tobacco even when use leads to major problems at work, school, or home.  Use of tobacco even when use leads to relationship problems.  Giving up or cutting down on important life activities because of tobacco use.  Repeatedly using tobacco in situations where it puts you or others in physical danger, like smoking in bed.  Use of tobacco even when it is known that a physical or mental problem is likely related to tobacco use. ? Physical problems are numerous and may include chronic bronchitis, emphysema, lung and other cancers, gum disease, high blood pressure, heart disease, and  stroke. ? Mental problems caused by tobacco may include difficulty sleeping and anxiety.  Need to use greater amounts of tobacco to get the same effect. This means you have developed a tolerance.  Withdrawal symptoms as a result of stopping or rapidly cutting back use. These symptoms may last a month or more after quitting and include the following: ? Depressed, anxious, or irritable mood. ? Difficulty concentrating. ? Increased appetite. ? Restlessness or trouble sleeping. ? Use of tobacco to avoid withdrawal symptoms.  How is this diagnosed? Tobacco use disorder is diagnosed by your health care provider. A diagnosis may be made by:  Your health care provider asking questions about your tobacco use and any problems it may be causing.  A physical exam.  Lab tests.  You may be referred to a mental health professional or addiction specialist.  The severity of tobacco use disorder depends on the number of signs and symptoms you have:  Mild-Two or three symptoms.  Moderate-Four or five symptoms.  Severe-Six or more symptoms.  How is this treated? Many people with tobacco use disorder are unable to quit on their own and need help. Treatment options include the following:  Nicotine replacement therapy (NRT). NRT provides nicotine without the other harmful chemicals in tobacco. NRT gradually lowers the dosage of nicotine in the body and reduces withdrawal symptoms. NRT is available in over-the-counter forms (gum, lozenges, and skin patches) as well as prescription forms (mouth inhaler and nasal spray).  Medicines.This may include: ? Antidepressant medicine that may reduce nicotine cravings. ? A medicine that acts on nicotine receptors in the brain to reduce cravings and withdrawal symptoms. It may also block the effects of tobacco in people with TUD who relapse.  Counseling or talk therapy. A form of talk therapy called behavioral therapy is commonly used to treat people with TUD.  Behavioral therapy looks at triggers for tobacco use, how to avoid them, and how to cope with cravings. It is most effective in person or by phone but is also available in self-help forms (books and Internet websites).  Support groups. These provide emotional support, advice, and guidance for quitting tobacco.  The most effective treatment for TUD is usually a combination of medicine, talk therapy, and support groups. Follow these instructions at home:  Keep all follow-up visits as directed by your health care provider. This is important.  Take medicines only as directed by your health care provider.  Check with your health care provider before starting new prescription or over-the-counter medicines. Contact a health care provider if:  You are not able to take your medicines as prescribed.  Treatment is not helping your TUD and your symptoms get worse. Get help right away if:  You have serious thoughts about hurting yourself or others.  You have trouble breathing, chest pain, sudden weakness, or sudden numbness in part of your body. This information is not intended to replace advice given to you by your health care provider. Make sure you discuss any questions you have with your health care provider. Document Released: 01/29/2004 Document Revised: 01/26/2016 Document Reviewed: 07/21/2013 Elsevier Interactive Patient Education  [employer].

## 2018-03-14 ENCOUNTER — Encounter (INDEPENDENT_AMBULATORY_CARE_PROVIDER_SITE_OTHER): Payer: Self-pay | Admitting: Physician Assistant

## 2018-03-14 ENCOUNTER — Ambulatory Visit (INDEPENDENT_AMBULATORY_CARE_PROVIDER_SITE_OTHER): Payer: Self-pay | Admitting: Physician Assistant

## 2018-03-14 ENCOUNTER — Other Ambulatory Visit: Payer: Self-pay

## 2018-03-14 VITALS — BP 197/110 | HR 59 | Temp 97.6°F | Ht 66.0 in | Wt 150.6 lb

## 2018-03-14 DIAGNOSIS — K0889 Other specified disorders of teeth and supporting structures: Secondary | ICD-10-CM

## 2018-03-14 MED ORDER — ACETAMINOPHEN-CODEINE #3 300-30 MG PO TABS: 1.0000 | ORAL_TABLET | Freq: Four times a day (QID) | ORAL | 0 refills | Status: AC | PRN

## 2018-03-14 MED ORDER — AMOXICILLIN-POT CLAVULANATE 875-125 MG PO TABS: 1.0000 | ORAL_TABLET | Freq: Two times a day (BID) | ORAL | 0 refills | Status: DC

## 2018-03-14 MED FILL — AMOX-CLAV 875-125 MG TABLET: 875-125 | 10 days supply | Qty: 20 | Fill #0

## 2018-03-14 MED FILL — ACETAMINOPHEN/COD #3 TABLET: 300-30 | 5 days supply | Qty: 20 | Fill #0

## 2018-03-14 NOTE — Progress Notes (Signed)
Subjective:  [name], male    DOB: 1955-08-18  Age: 62 y.o. MRN: [id number]  CC: dental pain  HPI [name] a 62 y.o.malewith a medical history of HTN,HLD,CVA, tobacco abuse, and asthma presentswith dental pain in tooth number 3 for the last few days. There is also mild right sided facial swelling. Has not taken anything for relief. Rest of teeth are decayed. Currently working on CAFA application to obtain referral to dentistry. Does not report bleeding, suppuration, f/c/n/v, throat swelling, tongue swelling, rash, CP, or palpitations.     Outpatient Medications Prior to Visit  Medication Sig Dispense Refill  . acetaminophen (TYLENOL) 500 MG tablet Take 1 tablet (500 mg total) by mouth every 8 (eight) hours as needed. 90 tablet 0  . amLODipine (NORVASC) 10 MG tablet Take 1 tablet (10 mg total) by mouth daily. 30 tablet 5  . aspirin 325 MG tablet Take 1 tablet (325 mg total) by mouth daily. 90 tablet 3  . hydrochlorothiazide (HYDRODIURIL) 25 MG tablet Take 1 tablet (25 mg total) by mouth daily. Take on tablet in the morning. 30 tablet 5  . metoprolol tartrate (LOPRESSOR) 25 MG tablet Take 1 tablet (25 mg total) by mouth 2 (two) times daily. 60 tablet 5  . simvastatin (ZOCOR) 20 MG tablet Take 1 tablet (20 mg total) by mouth at bedtime. 30 tablet 5  . trolamine salicylate (ASPERCREME) 10 % cream Apply 1 application topically as needed for muscle pain.     No facility-administered medications prior to visit.      ROS Review of Systems  Constitutional: Negative for chills, fever and malaise/fatigue.  HENT:       Tooth pain  Eyes: Negative for blurred vision.  Respiratory: Negative for shortness of breath.   Cardiovascular: Negative for chest pain and palpitations.  Gastrointestinal: Negative for abdominal pain and nausea.  Genitourinary: Negative for dysuria and hematuria.  Musculoskeletal: Negative for joint pain and myalgias.  Skin: Negative for  rash.  Neurological: Negative for tingling and headaches.  Psychiatric/Behavioral: Negative for depression. The patient is not nervous/anxious.     Objective:  BP (!) 204/111 (BP Location: Left Arm, Patient Position: Sitting, Cuff Size: Normal)   Pulse 62   Temp 97.6 F (36.4 C) (Oral)   Ht 5\' 6"  (1.676 m)   Wt 150 lb 9.6 oz (68.3 kg)   SpO2 99%   BMI 24.31 kg/m   BP/Weight 03/14/2018 03/11/2018 9/62/8366  Systolic BP 294 765 465  Diastolic BP 035 465 84  Wt. (Lbs) 150.6 153.6 151  BMI 24.31 24.79 24.37      Physical Exam  Constitutional: He is oriented to person, place, and time.  Well developed, well nourished, NAD, polite  HENT:  Head: Normocephalic and atraumatic.  TTP along tooth number three. No erythema, bleeding, or suppuration of the gum. Mild right sided swelling at the upper cheek. No signs of Ludwig angina.  Eyes: No scleral icterus.  Neck: Normal range of motion. Neck supple. No thyromegaly present.  Cardiovascular: Normal rate, regular rhythm and normal heart sounds.  Pulmonary/Chest: Effort normal and breath sounds normal.  Musculoskeletal: He exhibits no edema.  Neurological: He is alert and oriented to person, place, and time.  Skin: Skin is warm and dry. No rash noted. No erythema. No pallor.  Psychiatric: He has a normal mood and affect. His behavior is normal. Thought content normal.  Vitals reviewed.    Assessment & Plan:    1. Pain,  dental - I have advised patient to go to ED if no better or worsening.  - Begin amoxicillin-clavulanate (AUGMENTIN) 875-125 MG tablet; Take 1 tablet by mouth 2 (two) times daily.  Dispense: 20 tablet; Refill: 0 - Begin codeine (TYLENOL #3) 300-30 MG tablet; Take 1 tablet by mouth every 6 (six) hours as needed for up to 5 days for moderate pain.  Dispense: 20 tablet; Refill: 0   Meds ordered this encounter  Medications  . amoxicillin-clavulanate (AUGMENTIN) 875-125 MG tablet    Sig: Take 1 tablet by mouth 2 (two)  times daily.    Dispense:  20 tablet    Refill:  0    Order Specific Question:   Supervising Provider    Answer:   Charlott Rakes [[id number]]  . acetaminophen-codeine (TYLENOL #3) 300-30 MG tablet    Sig: Take 1 tablet by mouth every 6 (six) hours as needed for up to 5 days for moderate pain.    Dispense:  20 tablet    Refill:  0    Order Specific Question:   Supervising Provider    Answer:   Charlott Rakes [[id number]]    Follow-up: Return if symptoms worsen or fail to improve.   Clent Demark PA

## 2018-03-14 NOTE — Patient Instructions (Addendum)
Please be aware of your pain and swelling. If pain and swelling become worrisome, please go to the emergency department as you would likely need IV antibiotic at that point.  Dental Pain Dental pain may be caused by many things, including:  Tooth decay (cavities or caries). Cavities cause the nerve of your tooth to be open to air and hot or cold temperatures. This can cause pain or discomfort.  Abscess or infection. A dental abscess is an area that is full of infected pus from a bacterial infection in the inner part of the tooth (pulp). It usually happens at the end of the tooth's root.  Injury.  An unknown reason (idiopathic).  Your pain may be mild or severe. It may only happen when:  You are chewing.  You are exposed to hot or cold temperature.  You are eating or drinking sugary foods or beverages, such as: ? Soda. ? Candy.  Your pain may also be there all of the time. Follow these instructions at home: Watch your dental pain for any changes. Do these things to lessen your discomfort:  Take medicines only as told by your dentist.  If your dentist tells you to take an antibiotic medicine, finish all of it even if you start to feel better.  Keep all follow-up visits as told by your dentist. This is important.  Do not apply heat to the outside of your face.  Rinse your mouth or gargle with salt water if told by your dentist. This helps with pain and swelling. ? You can make salt water by adding  tsp of salt to 1 cup of warm water.  Apply ice to the painful area of your face: ? Put ice in a plastic bag. ? Place a towel between your skin and the bag. ? Leave the ice on for 20 minutes, 2-3 times per day.  Avoid foods or drinks that cause you pain, such as: ? Very hot or very cold foods or drinks. ? Sweet or sugary foods or drinks.  Contact a doctor if:  Your pain is not helped with medicines.  Your symptoms are worse.  You have new symptoms. Get help right away  if:  You cannot open your mouth.  You are having trouble breathing or swallowing.  You have a fever.  Your face, neck, or jaw is puffy (swollen). This information is not intended to replace advice given to you by your health care provider. Make sure you discuss any questions you have with your health care provider. Document Released: 11/11/2007 Document Revised: 10/31/2015 Document Reviewed: 05/21/2014 Elsevier Interactive Patient Education  [employer].

## 2019-07-17 ENCOUNTER — Ambulatory Visit (INDEPENDENT_AMBULATORY_CARE_PROVIDER_SITE_OTHER)
Admission: EM | Admit: 2019-07-17 | Discharge: 2019-07-17 | Disposition: A | Discharge status: Nursing Home (Includes ICF) | Payer: Medicare Other | Source: Home / Self Care

## 2019-07-17 ENCOUNTER — Emergency Department (HOSPITAL_COMMUNITY): Payer: Medicare Other

## 2019-07-17 ENCOUNTER — Encounter (HOSPITAL_COMMUNITY): Payer: Self-pay | Admitting: Emergency Medicine

## 2019-07-17 ENCOUNTER — Other Ambulatory Visit: Payer: Self-pay

## 2019-07-17 ENCOUNTER — Inpatient Hospital Stay (HOSPITAL_COMMUNITY)
Admission: EM | Admit: 2019-07-17 | Discharge: 2019-07-23 | DRG: 291 | Disposition: A | Discharge status: Home / Self Care | Payer: Medicare Other | Source: Ambulatory Visit | Attending: Internal Medicine | Admitting: Internal Medicine

## 2019-07-17 DIAGNOSIS — J441 Chronic obstructive pulmonary disease with (acute) exacerbation: Secondary | ICD-10-CM | POA: Diagnosis present

## 2019-07-17 DIAGNOSIS — Z9114 Patient's other noncompliance with medication regimen: Secondary | ICD-10-CM

## 2019-07-17 DIAGNOSIS — I5032 Chronic diastolic (congestive) heart failure: Secondary | ICD-10-CM

## 2019-07-17 DIAGNOSIS — R0609 Other forms of dyspnea: Secondary | ICD-10-CM

## 2019-07-17 DIAGNOSIS — Z8673 Personal history of transient ischemic attack (TIA), and cerebral infarction without residual deficits: Secondary | ICD-10-CM

## 2019-07-17 DIAGNOSIS — Z7982 Long term (current) use of aspirin: Secondary | ICD-10-CM

## 2019-07-17 DIAGNOSIS — F17210 Nicotine dependence, cigarettes, uncomplicated: Secondary | ICD-10-CM | POA: Diagnosis present

## 2019-07-17 DIAGNOSIS — Q211 Atrial septal defect: Secondary | ICD-10-CM

## 2019-07-17 DIAGNOSIS — N1831 Chronic kidney disease, stage 3a: Secondary | ICD-10-CM | POA: Diagnosis present

## 2019-07-17 DIAGNOSIS — Z20822 Contact with and (suspected) exposure to covid-19: Secondary | ICD-10-CM | POA: Diagnosis present

## 2019-07-17 DIAGNOSIS — R0602 Shortness of breath: Secondary | ICD-10-CM | POA: Diagnosis not present

## 2019-07-17 DIAGNOSIS — D649 Anemia, unspecified: Secondary | ICD-10-CM | POA: Diagnosis present

## 2019-07-17 DIAGNOSIS — I160 Hypertensive urgency: Secondary | ICD-10-CM | POA: Diagnosis present

## 2019-07-17 DIAGNOSIS — R609 Edema, unspecified: Secondary | ICD-10-CM | POA: Diagnosis not present

## 2019-07-17 DIAGNOSIS — J9601 Acute respiratory failure with hypoxia: Secondary | ICD-10-CM | POA: Diagnosis present

## 2019-07-17 DIAGNOSIS — I10 Essential (primary) hypertension: Secondary | ICD-10-CM | POA: Diagnosis not present

## 2019-07-17 DIAGNOSIS — D631 Anemia in chronic kidney disease: Secondary | ICD-10-CM | POA: Diagnosis present

## 2019-07-17 DIAGNOSIS — J810 Acute pulmonary edema: Secondary | ICD-10-CM | POA: Diagnosis present

## 2019-07-17 DIAGNOSIS — N179 Acute kidney failure, unspecified: Secondary | ICD-10-CM | POA: Diagnosis present

## 2019-07-17 DIAGNOSIS — R0600 Dyspnea, unspecified: Secondary | ICD-10-CM | POA: Diagnosis not present

## 2019-07-17 DIAGNOSIS — M1990 Unspecified osteoarthritis, unspecified site: Secondary | ICD-10-CM | POA: Diagnosis present

## 2019-07-17 DIAGNOSIS — I130 Hypertensive heart and chronic kidney disease with heart failure and stage 1 through stage 4 chronic kidney disease, or unspecified chronic kidney disease: Principal | ICD-10-CM | POA: Diagnosis present

## 2019-07-17 DIAGNOSIS — E785 Hyperlipidemia, unspecified: Secondary | ICD-10-CM | POA: Diagnosis present

## 2019-07-17 DIAGNOSIS — E872 Acidosis: Secondary | ICD-10-CM | POA: Diagnosis not present

## 2019-07-17 DIAGNOSIS — R778 Other specified abnormalities of plasma proteins: Secondary | ICD-10-CM

## 2019-07-17 DIAGNOSIS — I5033 Acute on chronic diastolic (congestive) heart failure: Secondary | ICD-10-CM | POA: Diagnosis present

## 2019-07-17 DIAGNOSIS — Z8249 Family history of ischemic heart disease and other diseases of the circulatory system: Secondary | ICD-10-CM

## 2019-07-17 DIAGNOSIS — Z79899 Other long term (current) drug therapy: Secondary | ICD-10-CM

## 2019-07-17 DIAGNOSIS — Z800 Family history of malignant neoplasm of digestive organs: Secondary | ICD-10-CM

## 2019-07-17 DIAGNOSIS — I161 Hypertensive emergency: Secondary | ICD-10-CM | POA: Diagnosis present

## 2019-07-17 DIAGNOSIS — E538 Deficiency of other specified B group vitamins: Secondary | ICD-10-CM | POA: Diagnosis present

## 2019-07-17 DIAGNOSIS — D539 Nutritional anemia, unspecified: Secondary | ICD-10-CM | POA: Diagnosis present

## 2019-07-17 HISTORY — DX: Tobacco use: Z72.0

## 2019-07-17 HISTORY — DX: Atrial septal defect: Q21.1

## 2019-07-17 HISTORY — DX: Other complications of anesthesia, initial encounter: T88.59XA

## 2019-07-17 HISTORY — DX: Patent foramen ovale: Q21.12

## 2019-07-17 HISTORY — DX: Aneurysm of heart: I25.3

## 2019-07-17 LAB — BASIC METABOLIC PANEL
Anion gap: 12 (ref 5–15)
BUN: 45 mg/dL — ABNORMAL HIGH (ref 8–23)
CO2: 16 mmol/L — ABNORMAL LOW (ref 22–32)
Calcium: 8.7 mg/dL — ABNORMAL LOW (ref 8.9–10.3)
Chloride: 113 mmol/L — ABNORMAL HIGH (ref 98–111)
Creatinine, Ser: 5.38 mg/dL — ABNORMAL HIGH (ref 0.61–1.24)
GFR calc Af Amer: 12 mL/min — ABNORMAL LOW (ref >60)
GFR calc non Af Amer: 10 mL/min — ABNORMAL LOW (ref >60)
Glucose, Bld: 102 mg/dL — ABNORMAL HIGH (ref 70–99)
Potassium: 4.6 mmol/L (ref 3.5–5.1)
Sodium: 141 mmol/L (ref 135–145)

## 2019-07-17 LAB — CBC
HCT: 37.7 % — ABNORMAL LOW (ref 39.0–52.0)
Hemoglobin: 11.8 g/dL — ABNORMAL LOW (ref 13.0–17.0)
MCH: 31.4 pg (ref 26.0–34.0)
MCHC: 31.3 g/dL (ref 30.0–36.0)
MCV: 100.3 fL — ABNORMAL HIGH (ref 80.0–100.0)
Platelets: 181 10*3/uL (ref 150–400)
RBC: 3.76 MIL/uL — ABNORMAL LOW (ref 4.22–5.81)
RDW: 14.4 % (ref 11.5–15.5)
WBC: 9.1 10*3/uL (ref 4.0–10.5)
nRBC: 0 % (ref 0.0–0.2)

## 2019-07-17 NOTE — Discharge Instructions (Signed)
Go to the ed now

## 2019-07-17 NOTE — ED Provider Notes (Addendum)
[name]    CSN: [id number] Arrival date & time: 07/17/19  1557      History   Chief Complaint Chief Complaint  Patient presents with  . Shortness of Breath  . Bloated  . Cough  . Nasal Congestion  . Leg Swelling    HPI Albert Harris is a 64 y.o. male.   Pt reports swelling in feet and abdomen.  Pt reports increasing shortness of breath.  Pt unable to sleep at night due to shortness of breath.  Some relief with elevating head with pillows.  Pt reports he has not taken blood pressure medication in the past year  The history is provided by the patient.  Shortness of Breath Severity:  Moderate Onset quality:  Gradual Timing:  Constant Progression:  Worsening Chronicity:  New Relieved by:  Nothing Worsened by:  Exertion Associated symptoms: cough   Cough Associated symptoms: shortness of breath     Past Medical History:  Diagnosis Date  . Asthma   . Hypertension     Patient Active Problem List   Diagnosis Date Noted  . Right sided weakness 11/09/2017  . CKD (chronic kidney disease), stage III 11/09/2017  . Tobacco abuse 01/19/2017  . Hypertensive urgency 12/24/2016  . [name] (transient ischemic attack) 12/24/2016    Past Surgical History:  Procedure Laterality Date  . FINGER SURGERY         Home Medications    Prior to Admission medications   Medication Sig Start Date End Date Taking? Authorizing Provider  acetaminophen (TYLENOL) 500 MG tablet Take 1 tablet (500 mg total) by mouth every 8 (eight) hours as needed. 01/28/18   Clent Demark, PA-C  amLODipine (NORVASC) 10 MG tablet Take 1 tablet (10 mg total) by mouth daily. 03/11/18   Clent Demark, PA-C  amoxicillin-clavulanate (AUGMENTIN) 875-125 MG tablet Take 1 tablet by mouth 2 (two) times daily. 03/14/18   Clent Demark, PA-C  aspirin 325 MG tablet Take 1 tablet (325 mg total) by mouth daily. 01/04/18   Clent Demark, PA-C  hydrochlorothiazide (HYDRODIURIL) 25 MG tablet  Take 1 tablet (25 mg total) by mouth daily. Take on tablet in the morning. 03/11/18   Clent Demark, PA-C  metoprolol tartrate (LOPRESSOR) 25 MG tablet Take 1 tablet (25 mg total) by mouth 2 (two) times daily. 03/11/18   Clent Demark, PA-C  simvastatin (ZOCOR) 20 MG tablet Take 1 tablet (20 mg total) by mouth at bedtime. 03/11/18   Clent Demark, PA-C  trolamine salicylate (ASPERCREME) 10 % cream Apply 1 application topically as needed for muscle pain.    [provider]    Family History Family History  Problem Relation Age of Onset  . Hypertension Mother   . Colon cancer Father   . Stomach cancer Brother     Social History Social History   Tobacco Use  . Smoking status: Current Every Day Smoker    Packs/day: 1.00    Types: Cigarettes  . Smokeless tobacco: Never Used  Substance Use Topics  . Alcohol use: Yes    Alcohol/week: 6.0 standard drinks    Types: 6 Cans of beer per week  . Drug use: No     Allergies   Patient has no known allergies.   Review of Systems Review of Systems  Respiratory: Positive for cough and shortness of breath.   All other systems reviewed and are negative.    Physical Exam Triage Vital Signs ED Triage Vitals  Enc Vitals Group     BP 07/17/19 1745 (!) 232/121     Pulse Rate 07/17/19 1745 94     Resp 07/17/19 1745 20     Temp 07/17/19 1745 98.1 F (36.7 C)     Temp Source 07/17/19 1745 Oral     SpO2 07/17/19 1745 100 %     Weight --      Height --      Head Circumference --      Peak Flow --      Pain Score 07/17/19 1747 0     Pain Loc --      Pain Edu? --      Excl. in [place]? --    No data found.  Updated Vital Signs BP (!) 247/123 (BP Location: Left Arm) Comment: reg cuff no shirt  Pulse 94   Temp 98.1 F (36.7 C) (Oral)   Resp 20   SpO2 100%   Visual Acuity Right Eye Distance:   Left Eye Distance:   Bilateral Distance:    Right Eye Near:   Left Eye Near:    Bilateral Near:     Physical  Exam Vitals reviewed.  Cardiovascular:     Rate and Rhythm: Normal rate and regular rhythm.  Pulmonary:     Effort: Pulmonary effort is normal.  Chest:     Chest wall: No tenderness.  Abdominal:     Tenderness: There is abdominal tenderness.  Musculoskeletal:     Right lower leg: Edema present.     Left lower leg: Edema present.  Skin:    General: Skin is warm.  Neurological:     Mental Status: He is alert.  Psychiatric:        Mood and Affect: Mood normal.      UC Treatments / Results  Labs (all labs ordered are listed, but only abnormal results are displayed) Labs Reviewed - No data to display  EKG EKg normal sinus 86 Left atrial enlargement lvh  no st changes  Qt is 398 Qrs 84 PR 142 Radiology No results found.  Procedures Procedures (including critical care time)  Medications Ordered in UC Medications - No data to display  Initial Impression / Assessment and Plan / UC Course  I have reviewed the triage vital signs and the nursing notes.  Pertinent labs & imaging results that were available during my care of the patient were reviewed by me and considered in my medical decision making (see chart for details).      Final Clinical Impressions(s) / UC Diagnoses   Final diagnoses:  Peripheral edema  Dyspnea, unspecified type  Dyspnea on exertion   Discharge Instructions   None    ED Prescriptions    None     PDMP not reviewed this encounter.  Go to the Emergency department for evaluation    Sidney Ace 07/17/19 1938    Fransico Meadow, PA-C 08/15/19 1739

## 2019-07-17 NOTE — ED Triage Notes (Signed)
Pt sent from UC due to HTN and SOB. States he hasnt taken his meds in about a year in a half. Reports he has been SOB for 2 weeks, pt is a smoker.

## 2019-07-18 ENCOUNTER — Encounter (HOSPITAL_COMMUNITY): Payer: Self-pay | Admitting: Internal Medicine

## 2019-07-18 ENCOUNTER — Inpatient Hospital Stay (HOSPITAL_COMMUNITY): Payer: Medicare Other

## 2019-07-18 DIAGNOSIS — I5031 Acute diastolic (congestive) heart failure: Secondary | ICD-10-CM

## 2019-07-18 DIAGNOSIS — D649 Anemia, unspecified: Secondary | ICD-10-CM | POA: Diagnosis present

## 2019-07-18 DIAGNOSIS — J9601 Acute respiratory failure with hypoxia: Secondary | ICD-10-CM | POA: Diagnosis present

## 2019-07-18 DIAGNOSIS — I130 Hypertensive heart and chronic kidney disease with heart failure and stage 1 through stage 4 chronic kidney disease, or unspecified chronic kidney disease: Secondary | ICD-10-CM | POA: Diagnosis present

## 2019-07-18 DIAGNOSIS — J810 Acute pulmonary edema: Secondary | ICD-10-CM | POA: Diagnosis not present

## 2019-07-18 DIAGNOSIS — D631 Anemia in chronic kidney disease: Secondary | ICD-10-CM | POA: Diagnosis present

## 2019-07-18 DIAGNOSIS — R778 Other specified abnormalities of plasma proteins: Secondary | ICD-10-CM | POA: Diagnosis not present

## 2019-07-18 DIAGNOSIS — Z9114 Patient's other noncompliance with medication regimen: Secondary | ICD-10-CM | POA: Diagnosis not present

## 2019-07-18 DIAGNOSIS — Z800 Family history of malignant neoplasm of digestive organs: Secondary | ICD-10-CM | POA: Diagnosis not present

## 2019-07-18 DIAGNOSIS — F17210 Nicotine dependence, cigarettes, uncomplicated: Secondary | ICD-10-CM | POA: Diagnosis present

## 2019-07-18 DIAGNOSIS — N179 Acute kidney failure, unspecified: Secondary | ICD-10-CM | POA: Diagnosis present

## 2019-07-18 DIAGNOSIS — N1831 Chronic kidney disease, stage 3a: Secondary | ICD-10-CM | POA: Diagnosis present

## 2019-07-18 DIAGNOSIS — J441 Chronic obstructive pulmonary disease with (acute) exacerbation: Secondary | ICD-10-CM | POA: Diagnosis present

## 2019-07-18 DIAGNOSIS — Z8673 Personal history of transient ischemic attack (TIA), and cerebral infarction without residual deficits: Secondary | ICD-10-CM | POA: Diagnosis not present

## 2019-07-18 DIAGNOSIS — E538 Deficiency of other specified B group vitamins: Secondary | ICD-10-CM | POA: Diagnosis present

## 2019-07-18 DIAGNOSIS — D539 Nutritional anemia, unspecified: Secondary | ICD-10-CM | POA: Diagnosis present

## 2019-07-18 DIAGNOSIS — E872 Acidosis: Secondary | ICD-10-CM | POA: Diagnosis not present

## 2019-07-18 DIAGNOSIS — I5033 Acute on chronic diastolic (congestive) heart failure: Secondary | ICD-10-CM | POA: Diagnosis present

## 2019-07-18 DIAGNOSIS — I160 Hypertensive urgency: Secondary | ICD-10-CM | POA: Diagnosis present

## 2019-07-18 DIAGNOSIS — Z8249 Family history of ischemic heart disease and other diseases of the circulatory system: Secondary | ICD-10-CM | POA: Diagnosis not present

## 2019-07-18 DIAGNOSIS — Q211 Atrial septal defect: Secondary | ICD-10-CM | POA: Diagnosis not present

## 2019-07-18 DIAGNOSIS — Z20822 Contact with and (suspected) exposure to covid-19: Secondary | ICD-10-CM | POA: Diagnosis present

## 2019-07-18 DIAGNOSIS — Z7982 Long term (current) use of aspirin: Secondary | ICD-10-CM | POA: Diagnosis not present

## 2019-07-18 DIAGNOSIS — Z79899 Other long term (current) drug therapy: Secondary | ICD-10-CM | POA: Diagnosis not present

## 2019-07-18 DIAGNOSIS — M1990 Unspecified osteoarthritis, unspecified site: Secondary | ICD-10-CM | POA: Diagnosis present

## 2019-07-18 DIAGNOSIS — R0602 Shortness of breath: Secondary | ICD-10-CM | POA: Diagnosis present

## 2019-07-18 DIAGNOSIS — I161 Hypertensive emergency: Secondary | ICD-10-CM | POA: Diagnosis present

## 2019-07-18 DIAGNOSIS — E785 Hyperlipidemia, unspecified: Secondary | ICD-10-CM | POA: Diagnosis present

## 2019-07-18 LAB — COMPREHENSIVE METABOLIC PANEL
ALT: 19 U/L (ref 0–44)
AST: 19 U/L (ref 15–41)
Albumin: 2.9 g/dL — ABNORMAL LOW (ref 3.5–5.0)
Alkaline Phosphatase: 70 U/L (ref 38–126)
Anion gap: 14 (ref 5–15)
BUN: 45 mg/dL — ABNORMAL HIGH (ref 8–23)
CO2: 16 mmol/L — ABNORMAL LOW (ref 22–32)
Calcium: 8.9 mg/dL (ref 8.9–10.3)
Chloride: 110 mmol/L (ref 98–111)
Creatinine, Ser: 5.23 mg/dL — ABNORMAL HIGH (ref 0.61–1.24)
GFR calc Af Amer: 12 mL/min — ABNORMAL LOW (ref >60)
GFR calc non Af Amer: 11 mL/min — ABNORMAL LOW (ref >60)
Glucose, Bld: 110 mg/dL — ABNORMAL HIGH (ref 70–99)
Potassium: 4.3 mmol/L (ref 3.5–5.1)
Sodium: 140 mmol/L (ref 135–145)
Total Bilirubin: 0.9 mg/dL (ref 0.3–1.2)
Total Protein: 6.2 g/dL — ABNORMAL LOW (ref 6.5–8.1)

## 2019-07-18 LAB — CBC WITH DIFFERENTIAL/PLATELET
Abs Immature Granulocytes: 0.05 10*3/uL (ref 0.00–0.07)
Basophils Absolute: 0.1 10*3/uL (ref 0.0–0.1)
Basophils Relative: 1 %
Eosinophils Absolute: 0.1 10*3/uL (ref 0.0–0.5)
Eosinophils Relative: 1 %
HCT: 38.1 % — ABNORMAL LOW (ref 39.0–52.0)
Hemoglobin: 12 g/dL — ABNORMAL LOW (ref 13.0–17.0)
Immature Granulocytes: 0 %
Lymphocytes Relative: 7 %
Lymphs Abs: 0.8 10*3/uL (ref 0.7–4.0)
MCH: 31.7 pg (ref 26.0–34.0)
MCHC: 31.5 g/dL (ref 30.0–36.0)
MCV: 100.8 fL — ABNORMAL HIGH (ref 80.0–100.0)
Monocytes Absolute: 0.1 10*3/uL (ref 0.1–1.0)
Monocytes Relative: 1 %
Neutro Abs: 10.2 10*3/uL — ABNORMAL HIGH (ref 1.7–7.7)
Neutrophils Relative %: 90 %
Platelets: 182 10*3/uL (ref 150–400)
RBC: 3.78 MIL/uL — ABNORMAL LOW (ref 4.22–5.81)
RDW: 14.4 % (ref 11.5–15.5)
WBC: 11.4 10*3/uL — ABNORMAL HIGH (ref 4.0–10.5)
nRBC: 0 % (ref 0.0–0.2)

## 2019-07-18 LAB — URINALYSIS, ROUTINE W REFLEX MICROSCOPIC
Bacteria, UA: NONE SEEN
Bilirubin Urine: NEGATIVE
Glucose, UA: 50 mg/dL — AB
Ketones, ur: NEGATIVE mg/dL
Leukocytes,Ua: NEGATIVE
Nitrite: NEGATIVE
Protein, ur: >=300 mg/dL — AB
Specific Gravity, Urine: 1.012 (ref 1.005–1.030)
pH: 5 (ref 5.0–8.0)

## 2019-07-18 LAB — TROPONIN I (HIGH SENSITIVITY)
Troponin I (High Sensitivity): 74 ng/L — ABNORMAL HIGH (ref <18)
Troponin I (High Sensitivity): 61 ng/L — ABNORMAL HIGH (ref <18)
Troponin I (High Sensitivity): 52 ng/L — ABNORMAL HIGH (ref <18)

## 2019-07-18 LAB — FOLATE: Folate: 12.6 ng/mL (ref >5.9)

## 2019-07-18 LAB — RAPID URINE DRUG SCREEN, HOSP PERFORMED
Amphetamines: NOT DETECTED
Barbiturates: NOT DETECTED
Benzodiazepines: NOT DETECTED
Cocaine: NOT DETECTED
Opiates: NOT DETECTED
Tetrahydrocannabinol: NOT DETECTED

## 2019-07-18 LAB — ECHOCARDIOGRAM COMPLETE
Height: 68 in
Weight: 2303.37 oz

## 2019-07-18 LAB — RETICULOCYTES
Immature Retic Fract: 9.3 % (ref 2.3–15.9)
RBC.: 3.51 MIL/uL — ABNORMAL LOW (ref 4.22–5.81)
Retic Count, Absolute: 50.2 10*3/uL (ref 19.0–186.0)
Retic Ct Pct: 1.4 % (ref 0.4–3.1)

## 2019-07-18 LAB — FERRITIN: Ferritin: 71 ng/mL (ref 24–336)

## 2019-07-18 LAB — IRON AND TIBC
Iron: 65 ug/dL (ref 45–182)
Saturation Ratios: 20 % (ref 17.9–39.5)
TIBC: 322 ug/dL (ref 250–450)
UIBC: 257 ug/dL

## 2019-07-18 LAB — HIV ANTIBODY (ROUTINE TESTING W REFLEX): HIV Screen 4th Generation wRfx: NONREACTIVE

## 2019-07-18 LAB — BRAIN NATRIURETIC PEPTIDE: B Natriuretic Peptide: 2859.6 pg/mL — ABNORMAL HIGH (ref 0.0–100.0)

## 2019-07-18 LAB — MAGNESIUM: Magnesium: 2.4 mg/dL (ref 1.7–2.4)

## 2019-07-18 LAB — RESPIRATORY PANEL BY RT PCR (FLU A&B, COVID)
Influenza A by PCR: NEGATIVE
Influenza B by PCR: NEGATIVE
SARS Coronavirus 2 by RT PCR: NEGATIVE

## 2019-07-18 LAB — SODIUM, URINE, RANDOM: Sodium, Ur: 119 mmol/L

## 2019-07-18 LAB — TSH: TSH: 2.63 u[IU]/mL (ref 0.350–4.500)

## 2019-07-18 LAB — VITAMIN B12: Vitamin B-12: 222 pg/mL (ref 180–914)

## 2019-07-18 LAB — CREATININE, URINE, RANDOM: Creatinine, Urine: 53.84 mg/dL

## 2019-07-18 MED ORDER — FUROSEMIDE 10 MG/ML IJ SOLN
80.0000 mg | Freq: Two times a day (BID) | INTRAMUSCULAR | Status: DC
  Administered 2019-07-18 – 2019-07-20 (×5): 80 mg via INTRAVENOUS
  Filled 2019-07-18 (×5): qty 8

## 2019-07-18 MED ORDER — FUROSEMIDE 10 MG/ML IJ SOLN
40.0000 mg | INTRAMUSCULAR | Status: AC
  Administered 2019-07-18: 40 mg via INTRAVENOUS
  Filled 2019-07-18: qty 4

## 2019-07-18 MED ORDER — METHYLPREDNISOLONE SODIUM SUCC 125 MG IJ SOLR
125.0000 mg | Freq: Once | INTRAMUSCULAR | Status: AC
  Administered 2019-07-18: 125 mg via INTRAVENOUS
  Filled 2019-07-18: qty 2

## 2019-07-18 MED ORDER — HYDRALAZINE HCL 25 MG PO TABS
25.0000 mg | ORAL_TABLET | Freq: Three times a day (TID) | ORAL | Status: DC
  Administered 2019-07-18 – 2019-07-23 (×16): 25 mg via ORAL
  Filled 2019-07-18 (×18): qty 1

## 2019-07-18 MED ORDER — GUAIFENESIN-DM 100-10 MG/5ML PO SYRP
5.0000 mL | ORAL_SOLUTION | ORAL | Status: DC | PRN
  Administered 2019-07-19 (×3): 5 mL via ORAL
  Filled 2019-07-18 (×4): qty 5

## 2019-07-18 MED ORDER — POTASSIUM CHLORIDE CRYS ER 10 MEQ PO TBCR
10.0000 meq | EXTENDED_RELEASE_TABLET | Freq: Two times a day (BID) | ORAL | Status: DC
  Administered 2019-07-18 – 2019-07-20 (×5): 10 meq via ORAL
  Filled 2019-07-18 (×5): qty 1

## 2019-07-18 MED ORDER — HEPARIN SODIUM (PORCINE) 5000 UNIT/ML IJ SOLN
5000.0000 [IU] | Freq: Three times a day (TID) | INTRAMUSCULAR | Status: DC
  Administered 2019-07-18 – 2019-07-23 (×16): 5000 [IU] via SUBCUTANEOUS
  Filled 2019-07-18 (×16): qty 1

## 2019-07-18 MED ORDER — HYDRALAZINE HCL 20 MG/ML IJ SOLN
10.0000 mg | INTRAMUSCULAR | Status: DC | PRN
  Administered 2019-07-18 – 2019-07-20 (×2): 10 mg via INTRAVENOUS
  Filled 2019-07-18 (×2): qty 1

## 2019-07-18 MED ORDER — ACETAMINOPHEN 650 MG RE SUPP: 650.0000 mg | Freq: Four times a day (QID) | RECTAL | Status: DC | PRN

## 2019-07-18 MED ORDER — INFLUENZA VAC A&B SA ADJ QUAD 0.5 ML IM PRSY
0.5000 mL | PREFILLED_SYRINGE | INTRAMUSCULAR | Status: DC
  Filled 2019-07-18: qty 0.5

## 2019-07-18 MED ORDER — AMLODIPINE BESYLATE 10 MG PO TABS
10.0000 mg | ORAL_TABLET | Freq: Every day | ORAL | Status: DC
  Administered 2019-07-19 – 2019-07-23 (×5): 10 mg via ORAL
  Filled 2019-07-18 (×5): qty 1

## 2019-07-18 MED ORDER — AMLODIPINE BESYLATE 5 MG PO TABS
10.0000 mg | ORAL_TABLET | Freq: Once | ORAL | Status: AC
  Administered 2019-07-18: 10 mg via ORAL
  Filled 2019-07-18: qty 2

## 2019-07-18 MED ORDER — NITROGLYCERIN 0.4 MG SL SUBL
0.4000 mg | SUBLINGUAL_TABLET | SUBLINGUAL | Status: DC | PRN
  Administered 2019-07-18 (×2): 0.4 mg via SUBLINGUAL
  Filled 2019-07-18: qty 1

## 2019-07-18 MED ORDER — ACETAMINOPHEN 325 MG PO TABS
650.0000 mg | ORAL_TABLET | Freq: Four times a day (QID) | ORAL | Status: DC | PRN
  Administered 2019-07-18: 650 mg via ORAL
  Filled 2019-07-18: qty 2

## 2019-07-18 MED ORDER — NICOTINE 7 MG/24HR TD PT24
7.0000 mg | MEDICATED_PATCH | Freq: Every day | TRANSDERMAL | Status: DC
  Administered 2019-07-18 – 2019-07-23 (×5): 7 mg via TRANSDERMAL
  Filled 2019-07-18 (×6): qty 1

## 2019-07-18 MED ORDER — HYDROCHLOROTHIAZIDE 25 MG PO TABS
25.0000 mg | ORAL_TABLET | Freq: Once | ORAL | Status: AC
  Administered 2019-07-18: 25 mg via ORAL
  Filled 2019-07-18: qty 1

## 2019-07-18 MED ORDER — FUROSEMIDE 10 MG/ML IJ SOLN: 80.0000 mg | Freq: Two times a day (BID) | INTRAMUSCULAR | Status: DC

## 2019-07-18 MED ORDER — ALBUTEROL SULFATE HFA 108 (90 BASE) MCG/ACT IN AERS
8.0000 | INHALATION_SPRAY | Freq: Once | RESPIRATORY_TRACT | Status: AC
  Administered 2019-07-18: 8 via RESPIRATORY_TRACT
  Filled 2019-07-18: qty 6.7

## 2019-07-18 MED ORDER — METOPROLOL TARTRATE 25 MG PO TABS
25.0000 mg | ORAL_TABLET | Freq: Once | ORAL | Status: AC
  Administered 2019-07-18: 25 mg via ORAL
  Filled 2019-07-18: qty 1

## 2019-07-18 NOTE — Progress Notes (Signed)
  Echocardiogram 2D Echocardiogram has been performed.  Michiel Cowboy 07/18/2019, 9:38 AM

## 2019-07-18 NOTE — Consult Note (Addendum)
Cardiology Consultation:   [name] MRN: [id number]; DOB: 12/05/1955  Admit date: 07/17/2019 Date of Consult: 07/18/2019  Primary Care Provider: Patient, No Pcp Per Primary Cardiologist: Dr. Sallyanne Kuster    Patient Profile:   [name] is a 64 y.o. male with a hx of hypertension, HLD, [name], PFO, ongoing tobacco abuse with possible COPD and noncompliance who is being seen today for the evaluation of CHF at the request of Dr. Hal Hope.   Seen by Dr. Sallyanne Kuster 11/2017 for PFO when admitted with [name]. Echo showed aneurysmal IAS with PFO and left to right shunting by color doppler.  Recommended outpatient transesophageal echocardiogram but no follow-up.   Unfortunately, he has stopped taking all of his medication for greater than 1 year ago.  History of Present Illness:   Mr. Prall presented for greater than 2 weeks history of progressive worsening cough, shortness of breath, orthopnea and PND.  He had intermittent chest discomfort.  His symptoms improved with walking or taking shower.  Has occupational exposures to dust.  Currently retired.  Taking daily NSAID.  No lower extremity edema.  He was smoking 1-1/2 a pack a day for about 50 years but cut back to half a pack a day since his symptoms started.  No Covid exposure.  No fever, chills or congestion.  His blood pressure was running high.  Due to ongoing symptoms he came for further evaluation.  Upon arrival, he was noted hypertensive at 228/173.  Creatinine 5.38.  BNP 2859.  Respiratory panel negative.  Urine drug screen is clear.  TSH normal.  High-sensitivity troponin 74>>61>>52. Chest  x-ray showed evidence of COPD with bibasilar atelectasis, less likely pleural effusion.  He was given IV Lasix 40 mg x 1, Lopressor 25 mg x 1 and hydrochlorothiazide 25 mg x 1 in ER.  Currently on Norvasc 10 mg and hydralazine 25 mg 3 times daily.  Heart Pathway Score:     Past Medical History:  Diagnosis Date   Asthma    Hypertension       Past Surgical History:  Procedure Laterality Date   FINGER SURGERY        Inpatient Medications: Scheduled Meds:  [START ON 07/19/2019] amLODipine  10 mg Oral Daily   heparin  5,000 Units Subcutaneous Q8H   hydrALAZINE  25 mg Oral Q8H   Continuous Infusions:   PRN Meds: acetaminophen **OR** acetaminophen, hydrALAZINE, nitroGLYCERIN  Allergies:   No Known Allergies  Social History:   Social History   Socioeconomic History   Marital status: Divorced    Spouse name: Not on file   Number of children: Not on file   Years of education: Not on file   Highest education level: Not on file  Occupational History   Not on file  Tobacco Use   Smoking status: Current Every Day Smoker    Packs/day: 1.00    Types: Cigarettes   Smokeless tobacco: Never Used  Substance and Sexual Activity   Alcohol use: Yes    Alcohol/week: 6.0 standard drinks    Types: 6 Cans of beer per week   Drug use: No   Sexual activity: Not Currently  Other Topics Concern   Not on file  Social History Narrative   Married   Works in Audiological scientist   Social Determinants of Health   Financial Resource Strain:    Difficulty of Paying Living Expenses: Not on file  Food Insecurity:    Worried About Charity fundraiser  in the Last Year: Not on file   Ran Out of Food in the Last Year: Not on file  Transportation Needs:    Lack of Transportation (Medical): Not on file   Lack of Transportation (Non-Medical): Not on file  Physical Activity:    Days of Exercise per Week: Not on file   Minutes of Exercise per Session: Not on file  Stress:    Feeling of Stress : Not on file  Social Connections:    Frequency of Communication with Friends and Family: Not on file   Frequency of Social Gatherings with Friends and Family: Not on file   Attends Religious Services: Not on file   Active Member of Clubs or Organizations: Not on file   Attends Archivist Meetings: Not on file   Marital  Status: Not on file  Intimate Partner Violence:    Fear of Current or Ex-Partner: Not on file   Emotionally Abused: Not on file   Physically Abused: Not on file   Sexually Abused: Not on file    Family History:   Family History  Problem Relation Age of Onset   Hypertension Mother    Colon cancer Father    Stomach cancer Brother      ROS:  Please see the history of present illness.  All other ROS reviewed and negative.     Physical Exam/Data:   Vitals:   07/18/19 0430 07/18/19 0500 07/18/19 0530 07/18/19 0659  BP: (!) 179/95 (!) 179/94 (!) 179/85 (!) 177/88  Pulse: 71 68 70 75  Resp: (!) 24 (!) 21 (!) 25 18  Temp:    98 F (36.7 C)  TempSrc:    Oral  SpO2: 100% 96% 98% 97%  Weight:    65.3 kg  Height:    5\' 8"  (1.727 m)    Intake/Output Summary (Last 24 hours) at 07/18/2019 0931 Last data filed at 07/18/2019 0919 Gross per 24 hour  Intake 240 ml  Output 300 ml  Net -60 ml   Last 3 Weights 07/18/2019 07/17/2019 03/14/2018  Weight (lbs) 143 lb 15.4 oz 145 lb 150 lb 9.6 oz  Weight (kg) 65.3 kg 65.772 kg 68.312 kg     Body mass index is 21.89 kg/m.  General: Thin frail male in no acute distress HEENT: normal Lymph: no adenopathy Neck: + JVD Endocrine:  No thryomegaly Vascular: No carotid bruits; FA pulses 2+ bilaterally without bruits  Cardiac:  normal S1, S2; RRR; + murmur Lungs:  clear to auscultation bilaterally, no wheezing, rhonchi or rales  Abd: soft, nontender, no hepatomegaly  Ext: no edema Musculoskeletal:  No deformities, BUE and BLE strength normal and equal Skin: warm and dry  Neuro:  CNs 2-12 intact, no focal abnormalities noted Psych:  Normal affect   EKG:  The EKG was personally reviewed and demonstrates: Sinus rhythm at rate of 93 bpm, LVH Telemetry:  Telemetry was personally reviewed and demonstrates: Sinus rhythm at rate of 80 to 90s  Relevant CV Studies:  Pending reading of echocardiogram this admission  Echo 11/10/2017 Study Conclusions   -  Left ventricle: The cavity size was normal. Wall thickness was    increased in a pattern of mild LVH. Systolic function was normal.    The estimated ejection fraction was in the range of 60% to 65%.    Wall motion was normal; there were no regional wall motion    abnormalities. Doppler parameters are consistent with abnormal    left ventricular relaxation (grade  1 diastolic dysfunction). The    E/e&' ratio is between 8-15, suggesting indeterminate LV filling    pressure.  - Mitral valve: Calcified annulus. Mildly thickened leaflets .    There was trivial regurgitation.  - Left atrium: The atrium was normal in size.  - Atrial septum: Aneurysmal IAS - PFO is present.  - Inferior vena cava: The vessel was normal in size. The    respirophasic diameter changes were in the normal range (>= 50%),    consistent with normal central venous pressure.   Impressions:   - LVEF 60-65%, mild LVH, normal wall motion, grade 1 DD,    indeterminate LV filling pressure, trivial MR, normal LA size,    aneurysmal IAS with PFO and left to right shunting by color    doppler, normal IVC. Consider referral for PFO closure in the    setting of [name]/stroke.   Laboratory Data:  High Sensitivity Troponin:   Recent Labs  Lab 07/18/19 0153 07/18/19 0529 07/18/19 0717  TROPONINIHS 74* 61* 52*     Chemistry Recent Labs  Lab 07/17/19 1849 07/18/19 0529  NA 141 140  K 4.6 4.3  CL 113* 110  CO2 16* 16*  GLUCOSE 102* 110*  BUN 45* 45*  CREATININE 5.38* 5.23*  CALCIUM 8.7* 8.9  GFRNONAA 10* 11*  GFRAA 12* 12*  ANIONGAP 12 14    Recent Labs  Lab 07/18/19 0529  PROT 6.2*  ALBUMIN 2.9*  AST 19  ALT 19  ALKPHOS 70  BILITOT 0.9   Hematology Recent Labs  Lab 07/17/19 1849 07/18/19 0529 07/18/19 0530  WBC 9.1 11.4*  --   RBC 3.76* 3.78* 3.51*  HGB 11.8* 12.0*  --   HCT 37.7* 38.1*  --   MCV 100.3* 100.8*  --   MCH 31.4 31.7  --   MCHC 31.3 31.5  --   RDW 14.4 14.4  --   PLT 181 182  --     BNP Recent Labs  Lab 07/18/19 0153  BNP 2,859.6*     Radiology/Studies:  DG Chest 2 View  Result Date: 07/17/2019 CLINICAL DATA:  64 year old male with shortness of breath. EXAM: CHEST - 2 VIEW COMPARISON:  Chest radiograph dated 11/09/2017. FINDINGS: There is mild diffuse interstitial coarsening and bronchitic changes. There is mild hyperexpansion of the lungs likely related to COPD. Minimal left lung base atelectasis/scarring. Mild blunting of the costophrenic angles likely related to hyperexpansion or scarring. Small pleural effusions are not excluded. No focal consolidation or pneumothorax. Stable cardiac silhouette. No acute osseous pathology. IMPRESSION: 1. Chronic hyperinflation, likely related to underlying COPD. 2. Bibasilar atelectasis/scarring versus less likely trace pleural effusions. Clinical correlation recommended. No focal consolidation. Electronically Signed   By: Anner Crete M.D.   On: 07/17/2019 19:01   US RENAL  Result Date: 07/18/2019 CLINICAL DATA:  Acute renal failure EXAM: RENAL / URINARY TRACT ULTRASOUND COMPLETE COMPARISON:  None. FINDINGS: Right Kidney: Renal measurements: 11 x 4.5 x 4.4 cm = volume: 112 mL . Echogenicity within normal limits. No mass or hydronephrosis visualized. Left Kidney: Renal measurements: 10 x 4.7 x 4.4 cm = volume: 111 mL. Echogenicity within normal limits. No mass or hydronephrosis visualized. Bladder: Appears normal for degree of bladder distention. Other: Bilateral pleural effusion which is small based on preceding chest x-ray. IMPRESSION: 1. Negative renal ultrasound. 2. Bilateral pleural effusion, likely small volume. Electronically Signed   By: Monte Fantasia M.D.   On: 07/18/2019 07:07   { Assessment and Plan:  Presumed acute diastolic heart failure Last echo 11/2017 with LV function of 60 to 65% and grade 1 diastolic dysfunction.  Now presenting with greater than 2-week history of progressive worsening cough, shortness of  breath, orthopnea and PND.  No lower extremity edema.  BNP greater than 2500.  Chest x-ray with COPD and less likely pleural effusion.  Negative renal ultrasound.  Assess volume overload by exam.  He was given IV Lasix 40mg  x 1 in ER.   - Will start IV lasix 80mg  BID. Strict I & O. Suspects diastolic heart failure in setting of long standing uncontrolled BP.   2.  AKI with possible CKD 3 stage III -Scr 5.38 on presentation. Improved to 5.23 with one dose of lasix.  -Renal ultrasound negative. 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 PFO with IAS - Previously recommendations transesophageal echocardiogram in 2019 however never followed up. - Pending echo  4.  Hypertensive urgency -Upon arrival, he was noted hypertensive at 228/173.  -Continue amlodipine and hydralazine -Titrate medication as needed  5.  Elevated troponin -High-sensitivity troponin trending down -Likely demand in setting of CHF, AKI and hypertensive urgency  6.  Ongoing tobacco abuse with possible COPD -Recommended complete tobacco cessation -Education given, seems poor insight   For questions or updates, please contact [place] Please consult [email or web address] for contact info under     Jarrett Soho, PA  07/18/2019 9:31 AM  Agree with note by Robbie Lis PA-C  We are asked to see Mr. Nebergall or heart failure evaluation.  He was seen several years ago by Dr. Sallyanne Kuster during the mission for [name] at which time he was found to have a PFO.  Plan was for transesophageal echo which was never performed.  Patient does have a history of untreated hypertension and ongoing tobacco abuse of a pack and a half a day.  He has had several week history of progressive dyspnea and was admitted with heart failure.  His BNP was elevated in the 2000 range.  His troponins were mildly elevated probably related to demand ischemia.  His serum creatinine is in the 5 range, up from his baseline several  years ago evidence of low 1 range.  His 2D echo per my visualization showed severe LVH with mild LV dysfunction with an EF in the 40 to 45% range.  His exam is benign.  He is got 1-2+ pitting edema.  Got rhonchi on exam probably from COPD and mild elevation of jugular venous pressure.  I suspect his renal sufficiency is multifactorial from NSAID use from arthritis and untreated hypertension.  Nephrology will evaluate.  I agree with current plan for treatment of hypertension including amlodipine and hydralazine.  Would increase Lasix dose to 80 mg IV twice daily until he begins to diurese.  It is unclear whether his renal function will improve at all at this point.  We talked about smoking cessation and dietary modification as well.  He does admit to eating a lot of salt.  Lorretta Harp, M.D., [place], [facility] [place] [address] [place], [place]  [address]  [phone] 07/18/2019 10:25 AM

## 2019-07-18 NOTE — ED Provider Notes (Signed)
[facility] Provider Note   CSN: [id number] Arrival date & time: 07/17/19  1833     History Chief Complaint  Patient presents with  . Hypertension  . Shortness of Breath    LINDBERG BLATZ is a 64 y.o. male.  The history is provided by the patient and medical records.    64 year old male with history of asthma, hypertension, chronic kidney disease stage III, history of [name], presenting to the ED with shortness of breath.  Patient reports this is been ongoing for about a week now.  Worse with lying flat and exertional activity, better at rest.  He does report cough when trying to lay flat and sleep, productive with white phlegm.  He denies any fever or chills.  He does have chronic wheezing and this is overall unchanged.  He has not had any chest pain.  He has intermittently felt some edema of his ankles and in his abdomen, this is less severe now but does worsen with standing up for long periods of time.  He denies any known history of heart failure.  He does report he is not taking any of his medications in the past year.  States his prescriptions lapsed and he never went back to the doctor to get them refilled.  He denies any sick contacts or known Covid exposures.  Past Medical History:  Diagnosis Date  . Asthma   . Hypertension     Patient Active Problem List   Diagnosis Date Noted  . Right sided weakness 11/09/2017  . CKD (chronic kidney disease), stage III 11/09/2017  . Tobacco abuse 01/19/2017  . Hypertensive urgency 12/24/2016  . [name] (transient ischemic attack) 12/24/2016    Past Surgical History:  Procedure Laterality Date  . FINGER SURGERY         Family History  Problem Relation Age of Onset  . Hypertension Mother   . Colon cancer Father   . Stomach cancer Brother     Social History   Tobacco Use  . Smoking status: Current Every Day Smoker    Packs/day: 1.00    Types: Cigarettes  . Smokeless tobacco: Never Used   Substance Use Topics  . Alcohol use: Yes    Alcohol/week: 6.0 standard drinks    Types: 6 Cans of beer per week  . Drug use: No    Home Medications Prior to Admission medications   Medication Sig Start Date End Date Taking? Authorizing Provider  acetaminophen (TYLENOL) 500 MG tablet Take 1 tablet (500 mg total) by mouth every 8 (eight) hours as needed. 01/28/18   Clent Demark, PA-C  amLODipine (NORVASC) 10 MG tablet Take 1 tablet (10 mg total) by mouth daily. 03/11/18   Clent Demark, PA-C  amoxicillin-clavulanate (AUGMENTIN) 875-125 MG tablet Take 1 tablet by mouth 2 (two) times daily. 03/14/18   Clent Demark, PA-C  aspirin 325 MG tablet Take 1 tablet (325 mg total) by mouth daily. 01/04/18   Clent Demark, PA-C  hydrochlorothiazide (HYDRODIURIL) 25 MG tablet Take 1 tablet (25 mg total) by mouth daily. Take on tablet in the morning. 03/11/18   Clent Demark, PA-C  metoprolol tartrate (LOPRESSOR) 25 MG tablet Take 1 tablet (25 mg total) by mouth 2 (two) times daily. 03/11/18   Clent Demark, PA-C  simvastatin (ZOCOR) 20 MG tablet Take 1 tablet (20 mg total) by mouth at bedtime. 03/11/18   Clent Demark, PA-C    Allergies  Patient has no known allergies.  Review of Systems   Review of Systems  Respiratory: Positive for cough and shortness of breath.   Cardiovascular: Positive for leg swelling.  All other systems reviewed and are negative.   Physical Exam Updated Vital Signs BP (!) 245/127   Pulse 86   Temp 97.6 F (36.4 C) (Oral)   Resp 19   Ht 5\' 8"  (1.727 m)   Wt 65.8 kg   SpO2 100%   BMI 22.05 kg/m   Physical Exam Vitals and nursing note reviewed.  Constitutional:      Appearance: He is well-developed.  HENT:     Head: Normocephalic and atraumatic.  Eyes:     Conjunctiva/sclera: Conjunctivae normal.     Pupils: Pupils are equal, round, and reactive to light.  Cardiovascular:     Rate and Rhythm: Normal rate and regular rhythm.      Heart sounds: Normal heart sounds.  Pulmonary:     Effort: Pulmonary effort is normal.     Breath sounds: Wheezing present.     Comments: Inspiratory and expiratory wheezes noted throughout, somewhat worse at the bases, no rhonchi or rales Abdominal:     General: Bowel sounds are normal.     Palpations: Abdomen is soft.  Musculoskeletal:        General: Normal range of motion.     Cervical back: Normal range of motion.     Comments: 1+ edema at the ankles  Skin:    General: Skin is warm and dry.  Neurological:     Mental Status: He is alert and oriented to person, place, and time.     ED Results / Procedures / Treatments   Labs (all labs ordered are listed, but only abnormal results are displayed) Labs Reviewed  CBC - Abnormal; Notable for the following components:      Result Value   RBC 3.76 (*)    Hemoglobin 11.8 (*)    HCT 37.7 (*)    MCV 100.3 (*)    All other components within normal limits  BASIC METABOLIC PANEL - Abnormal; Notable for the following components:   Chloride 113 (*)    CO2 16 (*)    Glucose, Bld 102 (*)    BUN 45 (*)    Creatinine, Ser 5.38 (*)    Calcium 8.7 (*)    GFR calc non Af Amer 10 (*)    GFR calc Af Amer 12 (*)    All other components within normal limits  BRAIN NATRIURETIC PEPTIDE - Abnormal; Notable for the following components:   B Natriuretic Peptide 2,859.6 (*)    All other components within normal limits  TROPONIN I (HIGH SENSITIVITY) - Abnormal; Notable for the following components:   Troponin I (High Sensitivity) 74 (*)    All other components within normal limits  RESPIRATORY PANEL BY RT PCR (FLU A&B, COVID)    EKG EKG Interpretation  Date/Time:  Monday July 17 2019 18:44:43 EST Ventricular Rate:  93 PR Interval:  142 QRS Duration: 82 QT Interval:  376 QTC Calculation: 467 R Axis:   45 Text Interpretation: Normal sinus rhythm Biatrial enlargement Left ventricular hypertrophy with repolarization abnormality (  Sokolow-Lyon , Cornell product , Romhilt-Estes ) Abnormal ECG No significant change since last tracing Confirmed by Deno Etienne [phone]) on 07/18/2019 1:54:12 AM   Radiology DG Chest 2 View  Result Date: 07/17/2019 CLINICAL DATA:  64 year old male with shortness of breath. EXAM: CHEST - 2 VIEW COMPARISON:  Chest radiograph dated 11/09/2017. FINDINGS: There is mild diffuse interstitial coarsening and bronchitic changes. There is mild hyperexpansion of the lungs likely related to COPD. Minimal left lung base atelectasis/scarring. Mild blunting of the costophrenic angles likely related to hyperexpansion or scarring. Small pleural effusions are not excluded. No focal consolidation or pneumothorax. Stable cardiac silhouette. No acute osseous pathology. IMPRESSION: 1. Chronic hyperinflation, likely related to underlying COPD. 2. Bibasilar atelectasis/scarring versus less likely trace pleural effusions. Clinical correlation recommended. No focal consolidation. Electronically Signed   By: Anner Crete M.D.   On: 07/17/2019 19:01    Procedures Procedures (including critical care time)  CRITICAL CARE Performed by: Larene Pickett   Total critical care time: 45 minutes  Critical care time was exclusive of separately billable procedures and treating other patients.  Critical care was necessary to treat or prevent imminent or life-threatening deterioration.  Critical care was time spent personally by me on the following activities: development of treatment plan with patient and/or surrogate as well as nursing, discussions with consultants, evaluation of patient's response to treatment, examination of patient, obtaining history from patient or surrogate, ordering and performing treatments and interventions, ordering and review of laboratory studies, ordering and review of radiographic studies, pulse oximetry and re-evaluation of patient's condition.   Medications Ordered in ED Medications  nitroGLYCERIN  (NITROSTAT) SL tablet 0.4 mg (0.4 mg Sublingual Given 07/18/19 0435)  methylPREDNISolone sodium succinate (SOLU-MEDROL) 125 mg/2 mL injection 125 mg (125 mg Intravenous Given 07/18/19 0207)  albuterol (VENTOLIN HFA) 108 (90 Base) MCG/ACT inhaler 8 puff (8 puffs Inhalation Given 07/18/19 0207)  amLODipine (NORVASC) tablet 10 mg (10 mg Oral Given 07/18/19 0206)  metoprolol tartrate (LOPRESSOR) tablet 25 mg (25 mg Oral Given 07/18/19 0206)  hydrochlorothiazide (HYDRODIURIL) tablet 25 mg (25 mg Oral Given 07/18/19 0206)  furosemide (LASIX) injection 40 mg (40 mg Intravenous Given 07/18/19 0323)    ED Course  I have reviewed the triage vital signs and the nursing notes.  Pertinent labs & imaging results that were available during my care of the patient were reviewed by me and considered in my medical decision making (see chart for details).    MDM Rules/Calculators/A&P  64 year old male here with essentially hypertensive emergency.  Has not had his home medications in approximately 1 year, now having symptoms consistent with CHF/fluid overload as well.  He is afebrile and nontoxic.  His blood pressure remains elevated on my assessment.  He denies any chest pain.  He does have some diffuse wheezes but no rhonchi or rales.  Does have some ankle edema.  Labs today with evidence of acute renal failure, creatinine today is 5.38 (baseline is around 1.3- 1.5).   Has not had his home medications in approximately 1 year so will give dose now.  Will add on troponin and BNP.  BNP 2859.  Trop 74, likely some demand ischemia.  Still not having any chest pain. Has had some increased tachypnea while here in the ED. Given dose of IV lasix and NTG.  Will reassess.  4:10 AM After meds, BP down trending to 194/98.  Patient feeling better, maintains good oxygentation.  Will admit for ongoing care.    Discussed with Dr. Hal Hope-- will admit for ongoing care.  Final Clinical Impression(s) / ED Diagnoses Final diagnoses:   Hypertensive emergency  Acute renal failure, unspecified acute renal failure type [facility])    Rx / DC Orders ED Discharge Orders    None       Quincy Carnes  M, PA-C 07/18/19 [place], [place], DO 07/18/19 [phone]

## 2019-07-18 NOTE — Progress Notes (Signed)
  Mobility Specialist Criteria Algorithm Info.  SATURATION QUALIFICATIONS: (This note is used to comply with regulatory documentation for home oxygen)  Patient Saturations on Room Air at Rest = 100%  Patient Saturations on Room Air while Ambulating = 100%  Patient Saturations on n/a Liters of oxygen while Ambulating = n/a%  Please briefly explain why patient needs home oxygen:   Mobility:   Activity: Ambulated in hall;Dangled on edge of bed Range of motion: Active;All extremities Level of assistance: Independent Assistive device: None Minutes stood: 3 minutes Minutes ambulated: 3 minutes Distance ambulated (ft): 300 ft Mobility response: Tolerated well (No complaints of SOB, dizziness or pain)  07/18/2019 9:03 AM

## 2019-07-18 NOTE — Plan of Care (Signed)
Plan of care reviewed with pt at bedside with wife. Education provided. Pt had echo at bedside. Up adlib. SOB on exertion but oxygen remains >94% on ra. Call bell in reach. Safety measures in place, will continue to monitor pt.  Problem: Education: Goal: Knowledge of General Education information will improve Description: Including pain rating scale, medication(s)/side effects and non-pharmacologic comfort measures 07/18/2019 1354 by [name], RN Outcome: Progressing 07/18/2019 1353 by [name], RN Outcome: Progressing   Problem: Education: Goal: Knowledge of disease or condition will improve Outcome: Progressing Goal: Knowledge of the prescribed therapeutic regimen will improve Outcome: Progressing Goal: Individualized Educational Video(s) Outcome: Progressing   Problem: Activity: Goal: Ability to tolerate increased activity will improve Outcome: Progressing Goal: Will verbalize the importance of balancing activity with adequate rest periods Outcome: Progressing   Problem: Respiratory: Goal: Ability to maintain a clear airway will improve Outcome: Progressing Goal: Levels of oxygenation will improve Outcome: Progressing Goal: Ability to maintain adequate ventilation will improve Outcome: Progressing

## 2019-07-18 NOTE — ED Notes (Signed)
Pt c/o CP rating 6 out of 10 central chest. Pt given NTG x2 with complete relief of CP. Primary RN Andruw and Dr. Hal Hope informed.

## 2019-07-18 NOTE — H&P (Addendum)
History and Physical    Albert Harris [id number] DOB: September 06, 1955 DOA: 07/17/2019  PCP: Patient, No Pcp Per  Patient coming from: Home.  Chief Complaint: Shortness of breath.  HPI: Albert Harris is a 63 y.o. male with history of hypertension, COPD, ongoing tobacco abuse, chronic kidney disease stage III creatinine is around 1.3 in July 2019 in our system has been experiencing shortness of breath for the last 2 weeks with some chest tightness.  Denies any fever chills or any productive cough.  Patient shortness of breath worsens with symptoms of orthopnea and paroxysmal nocturnal dyspnea.  Noticed increasing swelling in the both lower extremity.  Has not been taking his medicines for almost a year and has not followed up with his primary care physician for the same time.  Had gone to urgent care and over the patient was found to have markedly elevated blood pressure was referred to the ER.  Patient states he has been taking Aleve at least 3 times a week for the last year or so for headaches.  ED Course: In the ER patient blood pressure initially was found to be around 240/120 with chest x-ray showing trace bilateral pleural effusion on exam patient has elevated JVD and wheezing on exam with mild bilateral lower extremity edema.  Patient was given Lasix 40 mg IV along with nitroglycerin sublingual hydrochlorothiazide and amlodipine and metoprolol.  Blood pressure improved to the 0000000 systolic.  So far had 358mL output.  UA is pending.  EKG shows sinus rhythm with biatrial enlargement.  Patient's labs are significant for patient's creatinine at around 5.38 with macrocytic anemia hemoglobin 611.8.  BNP was 2800 high-sensitivity troponin was 74.  Covid test was negative.  Review of Systems: As per HPI, rest all negative.   Past Medical History:  Diagnosis Date  . Asthma   . Hypertension     Past Surgical History:  Procedure Laterality Date  . FINGER SURGERY       reports that he has been  smoking cigarettes. He has been smoking about 1.00 pack per day. He has never used smokeless tobacco. He reports current alcohol use of about 6.0 standard drinks of alcohol per week. He reports that he does not use drugs.  No Known Allergies  Family History  Problem Relation Age of Onset  . Hypertension Mother   . Colon cancer Father   . Stomach cancer Brother     Prior to Admission medications   Medication Sig Start Date End Date Taking? Authorizing Provider  acetaminophen (TYLENOL) 500 MG tablet Take 1 tablet (500 mg total) by mouth every 8 (eight) hours as needed. Patient not taking: Reported on 07/18/2019 01/28/18   Clent Demark, PA-C  amLODipine (NORVASC) 10 MG tablet Take 1 tablet (10 mg total) by mouth daily. Patient not taking: Reported on 07/18/2019 03/11/18   Clent Demark, PA-C  amoxicillin-clavulanate (AUGMENTIN) 875-125 MG tablet Take 1 tablet by mouth 2 (two) times daily. Patient not taking: Reported on 07/18/2019 03/14/18   Clent Demark, PA-C  aspirin 325 MG tablet Take 1 tablet (325 mg total) by mouth daily. Patient not taking: Reported on 07/18/2019 01/04/18   Clent Demark, PA-C  hydrochlorothiazide (HYDRODIURIL) 25 MG tablet Take 1 tablet (25 mg total) by mouth daily. Take on tablet in the morning. Patient not taking: Reported on 07/18/2019 03/11/18   Clent Demark, PA-C  metoprolol tartrate (LOPRESSOR) 25 MG tablet Take 1 tablet (25 mg total) by mouth 2 (two)  times daily. Patient not taking: Reported on 07/18/2019 03/11/18   Clent Demark, PA-C  simvastatin (ZOCOR) 20 MG tablet Take 1 tablet (20 mg total) by mouth at bedtime. Patient not taking: Reported on 07/18/2019 03/11/18   Clent Demark, PA-C    Physical Exam: Constitutional: Moderately built and nourished. Vitals:   07/18/19 0400 07/18/19 0429 07/18/19 0430 07/18/19 0500  BP: (!) 194/98 (!) 193/86 (!) 179/95 (!) 179/94  Pulse: 71 73 71 68  Resp: (!) 24 (!) 22 (!) 24 (!) 21  Temp:        TempSrc:      SpO2: 98% 99% 100% 96%  Weight:      Height:       Eyes: Anicteric no pallor. ENMT: No discharge from the ears eyes nose or mouth. Neck: JVD elevated no mass felt. Respiratory: Bilateral expiratory wheeze and no crepitations. Cardiovascular: S1-S2 heard. Abdomen: Soft nontender bowel sounds present. Musculoskeletal: Mild edema both lower extremities. Skin: No rash. Neurologic: Alert awake oriented to time place and person.  Moves all extremities. Psychiatric: Appears normal per normal affect.   Labs on Admission: I have personally reviewed following labs and imaging studies  CBC: Recent Labs  Lab 07/17/19 1849  WBC 9.1  HGB 11.8*  HCT 37.7*  MCV 100.3*  PLT 0000000   Basic Metabolic Panel: Recent Labs  Lab 07/17/19 1849  NA 141  K 4.6  CL 113*  CO2 16*  GLUCOSE 102*  BUN 45*  CREATININE 5.38*  CALCIUM 8.7*   GFR: Estimated Creatinine Clearance: 12.7 mL/min (A) (by C-G formula based on SCr of 5.38 mg/dL (H)). Liver Function Tests: No results for input(s): AST, ALT, ALKPHOS, BILITOT, PROT, ALBUMIN in the last 168 hours. No results for input(s): LIPASE, AMYLASE in the last 168 hours. No results for input(s): AMMONIA in the last 168 hours. Coagulation Profile: No results for input(s): INR, PROTIME in the last 168 hours. Cardiac Enzymes: No results for input(s): CKTOTAL, CKMB, CKMBINDEX, TROPONINI in the last 168 hours. BNP (last 3 results) No results for input(s): PROBNP in the last 8760 hours. HbA1C: No results for input(s): HGBA1C in the last 72 hours. CBG: No results for input(s): GLUCAP in the last 168 hours. Lipid Profile: No results for input(s): CHOL, HDL, LDLCALC, TRIG, CHOLHDL, LDLDIRECT in the last 72 hours. Thyroid Function Tests: No results for input(s): TSH, T4TOTAL, FREET4, T3FREE, THYROIDAB in the last 72 hours. Anemia Panel: No results for input(s): VITAMINB12, FOLATE, FERRITIN, TIBC, IRON, RETICCTPCT in the last 72 hours. Urine  analysis:    Component Value Date/Time   COLORURINE STRAW (A) 12/24/2016 2232   APPEARANCEUR CLEAR 12/24/2016 2232   LABSPEC 1.016 12/24/2016 2232   PHURINE 7.0 12/24/2016 2232   GLUCOSEU NEGATIVE 12/24/2016 2232   HGBUR NEGATIVE 12/24/2016 2232   BILIRUBINUR NEGATIVE 12/24/2016 2232   KETONESUR NEGATIVE 12/24/2016 2232   PROTEINUR 100 (A) 12/24/2016 2232   UROBILINOGEN 1.0 03/19/2014 1051   NITRITE NEGATIVE 12/24/2016 2232   LEUKOCYTESUR NEGATIVE 12/24/2016 2232   Sepsis Labs: @LABRCNTIP (procalcitonin:4,lacticidven:4) ) Recent Results (from the past 240 hour(s))  Respiratory Panel by RT PCR (Flu A&B, Covid) - Nasopharyngeal Swab     Status: None   Collection Time: 07/18/19  2:06 AM   Specimen: Nasopharyngeal Swab  Result Value Ref Range Status   SARS Coronavirus 2 by RT PCR NEGATIVE NEGATIVE Final    Comment: (NOTE) SARS-CoV-2 target nucleic acids are NOT DETECTED. The SARS-CoV-2 RNA is generally detectable in upper respiratoy specimens  during the acute phase of infection. The lowest concentration of SARS-CoV-2 viral copies this assay can detect is 131 copies/mL. A negative result does not preclude SARS-Cov-2 infection and should not be used as the sole basis for treatment or other patient management decisions. A negative result may occur with  improper specimen collection/handling, submission of specimen other than nasopharyngeal swab, presence of viral mutation(s) within the areas targeted by this assay, and inadequate number of viral copies (<131 copies/mL). A negative result must be combined with clinical observations, patient history, and epidemiological information. The expected result is Negative. Fact Sheet for Patients:  PinkCheek.be Fact Sheet for Healthcare Providers:  GravelBags.it This test is not yet ap proved or cleared by the [place] FDA and  has been authorized for detection and/or diagnosis  of SARS-CoV-2 by FDA under an Emergency Use Authorization (EUA). This EUA will remain  in effect (meaning this test can be used) for the duration of the COVID-19 declaration under Section 564(b)(1) of the Act, 21 U.S.C. section 360bbb-3(b)(1), unless the authorization is terminated or revoked sooner.    Influenza A by PCR NEGATIVE NEGATIVE Final   Influenza B by PCR NEGATIVE NEGATIVE Final    Comment: (NOTE) The Xpert Xpress SARS-CoV-2/FLU/RSV assay is intended as an aid in  the diagnosis of influenza from Nasopharyngeal swab specimens and  should not be used as a sole basis for treatment. Nasal washings and  aspirates are unacceptable for Xpert Xpress SARS-CoV-2/FLU/RSV  testing. Fact Sheet for Patients: PinkCheek.be Fact Sheet for Healthcare Providers: GravelBags.it This test is not yet approved or cleared by the [place] FDA and  has been authorized for detection and/or diagnosis of SARS-CoV-2 by  FDA under an Emergency Use Authorization (EUA). This EUA will remain  in effect (meaning this test can be used) for the duration of the  Covid-19 declaration under Section 564(b)(1) of the Act, 21  U.S.C. section 360bbb-3(b)(1), unless the authorization is  terminated or revoked. Performed at [facility], [place] [address], [place], [place] [address]      Radiological Exams on Admission: DG Chest 2 View  Result Date: 07/17/2019 CLINICAL DATA:  64 year old male with shortness of breath. EXAM: CHEST - 2 VIEW COMPARISON:  Chest radiograph dated 11/09/2017. FINDINGS: There is mild diffuse interstitial coarsening and bronchitic changes. There is mild hyperexpansion of the lungs likely related to COPD. Minimal left lung base atelectasis/scarring. Mild blunting of the costophrenic angles likely related to hyperexpansion or scarring. Small pleural effusions are not excluded. No focal consolidation or pneumothorax. Stable cardiac  silhouette. No acute osseous pathology. IMPRESSION: 1. Chronic hyperinflation, likely related to underlying COPD. 2. Bibasilar atelectasis/scarring versus less likely trace pleural effusions. Clinical correlation recommended. No focal consolidation. Electronically Signed   By: Anner Crete M.D.   On: 07/17/2019 19:01    EKG: Independently reviewed.  Sinus rhythm with biatrial enlargement.  Assessment/Plan Principal Problem:   Acute respiratory failure with hypoxia (HCC) Active Problems:   Hypertensive urgency   Acute pulmonary edema (HCC)   ARF (acute renal failure) (HCC)   Macrocytic anemia    1. Acute respiratory failure with hypoxia likely from pulmonary edema from fluid overload and also uncontrolled blood pressure for which patient has been given 1 dose of Lasix 40 mg IV based on the response will order further doses and closely follow intake output metabolic panel daily weights and check 2D echo and cardiology has been consulted.  Patient's previous 2D echo done in 2019 showed EF of  60 to 65% with PFO.  Grade 1 diastolic dysfunction. 2. Acute on chronic kidney disease stage III likely worsened because of uncontrolled blood pressure and use of NSAIDs.  UA and FENa and SPEP and urine immunofixation tests are pending.  Patient received Lasix and hydrochlorothiazide in the ER.  Closely follow intake output metabolic panel.  May need nephrology input.  Renal sonogram is pending. 3. Hypertensive urgency presently on amlodipine hydralazine was added by me and as needed IV hydralazine will need further dose of Lasix based on response.  Closely follow blood pressure trends. 4. Chest pain likely from uncontrolled blood pressure and pulmonary edema.  Presently chest pain-free after sublingual nitroglycerin.  Will trend cardiac markers check 2D echo aspirin and further care recommendation accordingly. 5. Macrocytic anemia follow CBC and check anemia panel.  Patient states he used to drink a lot of  alcohol and has quit for at least 2 to 3 years. 6. Possible COPD -on exam patient is wheezing and chest x-ray that showed features concerning.  We will keep patient on as needed nebulizer. 7. Tobacco abuse advised about quitting.  Given the acute hypoxic respiratory failure with significant renal failure will need close monitoring for any further deterioration in inpatient status.   DVT prophylaxis: Heparin. Code Status: Full code. Family Communication: Discussed with patient. Disposition Plan: Home. Consults called: Cardiology. Admission status: Inpatient.   Rise Patience MD [facility] Pager [phone].  If 7PM-7AM, please contact night-coverage [email or web address] Password [email or web address]  07/18/2019, 5:32 AM

## 2019-07-18 NOTE — Progress Notes (Signed)
[facility]  I have reviewed the chart and examined the patient who was admitted this AM by my colleague.  He has a hoarse cough and is coughing up clear sputum. He still feels short of breath when moving around.  He admits he is still smoking and admits he has not taken meds in over a year because his doctor left the practice.   Today's Vitals   07/18/19 0530 07/18/19 0641 07/18/19 0659 07/18/19 1133  BP: (!) 179/85  (!) 177/88 (!) 155/76  Pulse: 70  75 72  Resp: (!) 25  18 20   Temp:   98 F (36.7 C) 97.8 F (36.6 C)  TempSrc:   Oral Oral  SpO2: 98%  97% 98%  Weight:   65.3 kg   Height:   5\' 8"  (1.727 m)   PainSc:  0-No pain     Body mass index is 21.89 kg/m.     Crackles and rhonchi on b/l lung fields   Principal Problem:   Acute respiratory failure with hypoxia  - secondary to acute CHF with likely underlying COPD - awaiting and ECHO- cont IV Lasix and BP control - appreciate cardiology eval -  Active Problems:   Hypertensive emergency - with chest pain and acute CHF- AKI may also be related to the severely elevated BP - BP improving and now 155/76 - cont Amlodipine, , Hydralazine, Lasix     ARF (acute renal failure)  - has a creatinine in Epic from 2019 which was 1.32 and thus I suspect his AKI is acute - he likely has underlying CKD due to uncontrolled HTN - Continue to diurese and follow Cr    Macrocytic anemia - vit B12 low normal at 222 - will start replacing via s/c route while in hospital   Chronic smoker with a chronic cough - likely has COPD and needs outpt PFTs - have advised him to quit smoking- he was smoking 1/2 ppd recently due to dyspnea but was smoking much more a couple of weeks ago (1.5 ppd) will place a nicotine patch   Note: he will need assistance finding a PCP  Debbe Odea, MD Pager: [email or web address]

## 2019-07-19 DIAGNOSIS — I5031 Acute diastolic (congestive) heart failure: Secondary | ICD-10-CM

## 2019-07-19 DIAGNOSIS — J441 Chronic obstructive pulmonary disease with (acute) exacerbation: Secondary | ICD-10-CM

## 2019-07-19 DIAGNOSIS — R778 Other specified abnormalities of plasma proteins: Secondary | ICD-10-CM

## 2019-07-19 DIAGNOSIS — I161 Hypertensive emergency: Secondary | ICD-10-CM

## 2019-07-19 DIAGNOSIS — N179 Acute kidney failure, unspecified: Secondary | ICD-10-CM

## 2019-07-19 DIAGNOSIS — D539 Nutritional anemia, unspecified: Secondary | ICD-10-CM

## 2019-07-19 LAB — PROTEIN ELECTROPHORESIS, SERUM
A/G Ratio: 1.2 (ref 0.7–1.7)
Albumin ELP: 3 g/dL (ref 2.9–4.4)
Alpha-1-Globulin: 0.4 g/dL (ref 0.0–0.4)
Alpha-2-Globulin: 0.7 g/dL (ref 0.4–1.0)
Beta Globulin: 0.9 g/dL (ref 0.7–1.3)
Gamma Globulin: 0.6 g/dL (ref 0.4–1.8)
Globulin, Total: 2.5 g/dL (ref 2.2–3.9)
Total Protein ELP: 5.5 g/dL — ABNORMAL LOW (ref 6.0–8.5)

## 2019-07-19 LAB — BASIC METABOLIC PANEL
Anion gap: 13 (ref 5–15)
BUN: 54 mg/dL — ABNORMAL HIGH (ref 8–23)
CO2: 16 mmol/L — ABNORMAL LOW (ref 22–32)
Calcium: 8.9 mg/dL (ref 8.9–10.3)
Chloride: 110 mmol/L (ref 98–111)
Creatinine, Ser: 5.27 mg/dL — ABNORMAL HIGH (ref 0.61–1.24)
GFR calc Af Amer: 12 mL/min — ABNORMAL LOW (ref >60)
GFR calc non Af Amer: 11 mL/min — ABNORMAL LOW (ref >60)
Glucose, Bld: 104 mg/dL — ABNORMAL HIGH (ref 70–99)
Potassium: 4.4 mmol/L (ref 3.5–5.1)
Sodium: 139 mmol/L (ref 135–145)

## 2019-07-19 LAB — IMMUNOFIXATION, URINE

## 2019-07-19 MED ORDER — IPRATROPIUM BROMIDE 0.02 % IN SOLN
0.5000 mg | RESPIRATORY_TRACT | Status: DC
  Administered 2019-07-19: 0.5 mg via RESPIRATORY_TRACT
  Filled 2019-07-19: qty 2.5

## 2019-07-19 MED ORDER — ALBUTEROL SULFATE (2.5 MG/3ML) 0.083% IN NEBU: 2.5000 mg | INHALATION_SOLUTION | RESPIRATORY_TRACT | Status: DC | PRN

## 2019-07-19 MED ORDER — MOMETASONE FURO-FORMOTEROL FUM 100-5 MCG/ACT IN AERO
2.0000 | INHALATION_SPRAY | Freq: Two times a day (BID) | RESPIRATORY_TRACT | Status: DC
  Administered 2019-07-19 – 2019-07-23 (×7): 2 via RESPIRATORY_TRACT
  Filled 2019-07-19: qty 8.8

## 2019-07-19 MED ORDER — IPRATROPIUM-ALBUTEROL 0.5-2.5 (3) MG/3ML IN SOLN
3.0000 mL | RESPIRATORY_TRACT | Status: DC
  Administered 2019-07-19 (×2): 3 mL via RESPIRATORY_TRACT
  Filled 2019-07-19 (×2): qty 3

## 2019-07-19 MED ORDER — METOPROLOL TARTRATE 5 MG/5ML IV SOLN
5.0000 mg | INTRAVENOUS | Status: AC | PRN
  Administered 2019-07-19: 5 mg via INTRAVENOUS
  Filled 2019-07-19: qty 5

## 2019-07-19 MED ORDER — CYANOCOBALAMIN 1000 MCG/ML IJ SOLN
1000.0000 ug | Freq: Every day | INTRAMUSCULAR | Status: DC
  Administered 2019-07-19 – 2019-07-22 (×4): 1000 ug via INTRAMUSCULAR
  Filled 2019-07-19 (×5): qty 1

## 2019-07-19 MED ORDER — IPRATROPIUM-ALBUTEROL 0.5-2.5 (3) MG/3ML IN SOLN: 3.0000 mL | RESPIRATORY_TRACT | Status: DC

## 2019-07-19 MED ORDER — METHYLPREDNISOLONE SODIUM SUCC 40 MG IJ SOLR
40.0000 mg | Freq: Three times a day (TID) | INTRAMUSCULAR | Status: DC
  Administered 2019-07-19 – 2019-07-20 (×3): 40 mg via INTRAVENOUS
  Filled 2019-07-19 (×3): qty 1

## 2019-07-19 MED ORDER — IPRATROPIUM-ALBUTEROL 0.5-2.5 (3) MG/3ML IN SOLN: 3.0000 mL | Freq: Two times a day (BID) | RESPIRATORY_TRACT | Status: DC

## 2019-07-19 MED ORDER — CARVEDILOL 6.25 MG PO TABS
6.2500 mg | ORAL_TABLET | Freq: Two times a day (BID) | ORAL | Status: DC
  Administered 2019-07-19 – 2019-07-23 (×9): 6.25 mg via ORAL
  Filled 2019-07-19 (×9): qty 1

## 2019-07-19 NOTE — Plan of Care (Signed)
  Problem: Education: Goal: Knowledge of General Education information will improve Description: Including pain rating scale, medication(s)/side effects and non-pharmacologic comfort measures 07/19/2019 1843 by Rolm Baptise, RN Outcome: Progressing 07/19/2019 1843 by Rolm Baptise, RN Outcome: Progressing   Problem: Education: Goal: Knowledge of disease or condition will improve 07/19/2019 1843 by Rolm Baptise, RN Outcome: Progressing 07/19/2019 1843 by Rolm Baptise, RN Outcome: Progressing Goal: Knowledge of the prescribed therapeutic regimen will improve 07/19/2019 1843 by Rolm Baptise, RN Outcome: Progressing 07/19/2019 1843 by Rolm Baptise, RN Outcome: Progressing Goal: Individualized Educational Video(s) 07/19/2019 1843 by Rolm Baptise, RN Outcome: Progressing 07/19/2019 1843 by Rolm Baptise, RN Outcome: Progressing   Problem: Activity: Goal: Ability to tolerate increased activity will improve 07/19/2019 1843 by Rolm Baptise, RN Outcome: Progressing 07/19/2019 1843 by Rolm Baptise, RN Outcome: Progressing Goal: Will verbalize the importance of balancing activity with adequate rest periods 07/19/2019 1843 by Rolm Baptise, RN Outcome: Progressing 07/19/2019 1843 by Rolm Baptise, RN Outcome: Progressing   Problem: Respiratory: Goal: Ability to maintain a clear airway will improve 07/19/2019 1843 by Rolm Baptise, RN Outcome: Progressing 07/19/2019 1843 by Karyl Kinnier D, RN Outcome: Progressing Goal: Levels of oxygenation will improve 07/19/2019 1843 by Rolm Baptise, RN Outcome: Progressing 07/19/2019 1843 by Rolm Baptise, RN Outcome: Progressing Goal: Ability to maintain adequate ventilation will improve 07/19/2019 1843 by Rolm Baptise, RN Outcome: Progressing 07/19/2019 1843 by Rolm Baptise, RN Outcome: Progressing   Problem: Education: Goal: Ability to demonstrate management of disease process will improve 07/19/2019 1843 by  Rolm Baptise, RN Outcome: Progressing 07/19/2019 1843 by Rolm Baptise, RN Outcome: Progressing Goal: Ability to verbalize understanding of medication therapies will improve 07/19/2019 1843 by Rolm Baptise, RN Outcome: Progressing 07/19/2019 1843 by Rolm Baptise, RN Outcome: Progressing Goal: Individualized Educational Video(s) 07/19/2019 1843 by Rolm Baptise, RN Outcome: Progressing 07/19/2019 1843 by Rolm Baptise, RN Outcome: Progressing   Problem: Activity: Goal: Capacity to carry out activities will improve 07/19/2019 1843 by Rolm Baptise, RN Outcome: Progressing 07/19/2019 1843 by Rolm Baptise, RN Outcome: Progressing   Problem: Cardiac: Goal: Ability to achieve and maintain adequate cardiopulmonary perfusion will improve 07/19/2019 1843 by Rolm Baptise, RN Outcome: Progressing 07/19/2019 1843 by Rolm Baptise, RN Outcome: Progressing   Problem: Education: Goal: Ability to demonstrate management of disease process will improve 07/19/2019 1843 by Rolm Baptise, RN Outcome: Progressing 07/19/2019 1843 by Rolm Baptise, RN Outcome: Progressing Goal: Ability to verbalize understanding of medication therapies will improve 07/19/2019 1843 by Rolm Baptise, RN Outcome: Progressing 07/19/2019 1843 by Rolm Baptise, RN Outcome: Progressing Goal: Individualized Educational Video(s) 07/19/2019 1843 by Rolm Baptise, RN Outcome: Progressing 07/19/2019 1843 by Rolm Baptise, RN Outcome: Progressing   Problem: Activity: Goal: Capacity to carry out activities will improve 07/19/2019 1843 by Rolm Baptise, RN Outcome: Progressing 07/19/2019 1843 by Rolm Baptise, RN Outcome: Progressing   Problem: Cardiac: Goal: Ability to achieve and maintain adequate cardiopulmonary perfusion will improve 07/19/2019 1843 by Rolm Baptise, RN Outcome: Progressing 07/19/2019 1843 by Rolm Baptise, RN Outcome: Progressing

## 2019-07-19 NOTE — Consult Note (Signed)
[facility] Consult Note    Assessment/ Plan:    AKI on CKD IIIa with proteinuria vs progressive CKD, nonoliguric: Cr 5.27, last known baseline 1.4 in 2019. No signs of uremia. While likely has a component of hemodynamically mediated acute injury due to poor renal perfusion with low effective arterial volume, suspect he has had significant progression of his CKD due to longstanding uncontrolled hypertension and NSAID nephropathy as well. Renal U/S without hydronephrosis. Difficult to say how much renal recovery we may see, will cont to monitor. Will obtain serology labs and P/Cr ratio to further quantify proteinuria. Agree with continuing IV lasix for now, will likely de-escalate tomorrow, 2/11.    Uncontrolled hypertension: Longstanding, contributing to above. Slow normalization of BP with Norvasc, hydralazine, coreg, and lasix. Currently SBP 150's, will monitor for now.    Acute respiratory failure with hypoxia: Improved. Likely 2/2 acute diastolic HF + COPD exacerbation. On RA. Receiving IV Lasix, steroids, and inhalers per cardiology and primary.   Macrocytic Anemia: Hgb 12.0. Iron sat 20. B12 222.     Tobacco abuse: 1/2 ppd, ~ 47 pack year history. Desires to quit. Recommend NRT.    HPI:   Albert Harris is a 64 year old gentleman with CKD stage III, hypertension, COPD, and tobacco use who is currently admitted for acute hypoxic respiratory failure in the setting of pulmonary edema with HFpEF (BNP >2500) and COPD exacerbation after presenting on 2/9 for worsening dyspnea and chest tightness.  Receiving IV Lasix 80 mg BID per cardiology. Nephrology was consulted due to worsening renal function on known CKD.  Creatinine 5.38 > 5.23 > 5.27 this morning.  Last creatinine 1.39 in 11/2017, appears to be his baseline, unclear if any progression of his renal function through 2020.  Last followed with a primary care physician in 2019, since that time has used the ED for his care.   Takes Bayer aspirin 325 mg daily and Aleve 1-2 tablets approximately every other day for the last 1.5 years.  Additionally, has not been taking any of his antihypertensives for the past year or more outpatient, came in with elevated systolics in [id number].  Smokes half pack of cigarettes per day, reports an approximate 47-pack-year history of tobacco use.  Wants to quit.  Denies illicit drug use, drinks 1-2 beers weekly.  Making urine, with great UOP on Lasix.  Down 800 mL since admit. Has not received any IV contrast this hospitalization.  Renal ultrasound normal kidney appearance without hydronephrosis or obstruction.  U/a with > 300 proteinuria and hyaline casts, appears to have proteinuria consistently for the past several years. Hemoglobin A1c 5.4 in 2019.  Albumin 2.9. Fena 8.3% (however urine electrolytes collected after Lasix therapy so unlikely to be accurate).  No family history of ESRD, autoimmune disorders that he is aware of.   Objective:   BP (!) 159/79   Pulse 80   Temp 98.3 F (36.8 C) (Oral)   Resp 18   Ht 5\' 8"  (1.727 m)   Wt 65.1 kg   SpO2 99%   BMI 21.82 kg/m   Intake/Output Summary (Last 24 hours) at 07/19/2019 1035 Last data filed at 07/19/2019 [id number] Gross per 24 hour  Intake 1250 ml  Output 2025 ml  Net -775 ml   Weight change: -0.672 kg  Physical Exam: General: Older gentleman in NAD, alert and sitting up comfortably on room air HEENT: NCAT, MMM  Cardiac: RRR  Lungs: Breathing comfortably on room air.  Diffuse expiratory and inspiratory wheeze  with good air exchange. Abdomen: soft, non-tender Msk: Moves all extremities spontaneously  Ext: Warm, dry, 2+ distal pulses, no edema to BLE Psych: Normal mood and affect  Imaging: DG Chest 2 View  Result Date: 07/17/2019 CLINICAL DATA:  64 year old male with shortness of breath. EXAM: CHEST - 2 VIEW COMPARISON:  Chest radiograph dated 11/09/2017. FINDINGS: There is mild diffuse interstitial coarsening and bronchitic changes.  There is mild hyperexpansion of the lungs likely related to COPD. Minimal left lung base atelectasis/scarring. Mild blunting of the costophrenic angles likely related to hyperexpansion or scarring. Small pleural effusions are not excluded. No focal consolidation or pneumothorax. Stable cardiac silhouette. No acute osseous pathology. IMPRESSION: 1. Chronic hyperinflation, likely related to underlying COPD. 2. Bibasilar atelectasis/scarring versus less likely trace pleural effusions. Clinical correlation recommended. No focal consolidation. Electronically Signed   By: Anner Crete M.D.   On: 07/17/2019 19:01   US RENAL  Result Date: 07/18/2019 CLINICAL DATA:  Acute renal failure EXAM: RENAL / URINARY TRACT ULTRASOUND COMPLETE COMPARISON:  None. FINDINGS: Right Kidney: Renal measurements: 11 x 4.5 x 4.4 cm = volume: 112 mL . Echogenicity within normal limits. No mass or hydronephrosis visualized. Left Kidney: Renal measurements: 10 x 4.7 x 4.4 cm = volume: 111 mL. Echogenicity within normal limits. No mass or hydronephrosis visualized. Bladder: Appears normal for degree of bladder distention. Other: Bilateral pleural effusion which is small based on preceding chest x-ray. IMPRESSION: 1. Negative renal ultrasound. 2. Bilateral pleural effusion, likely small volume. Electronically Signed   By: Monte Fantasia M.D.   On: 07/18/2019 07:07   ECHOCARDIOGRAM COMPLETE  Result Date: 07/18/2019    ECHOCARDIOGRAM REPORT   Patient Name:   Albert Harris Date of Exam: 07/18/2019 Medical Rec #:  [id number]      Height:       68.0 in Accession #:    [id number]     Weight:       144.0 lb Date of Birth:  [birth year]     BSA:          1.78 m Patient Age:    52 years       BP:           177/88 mmHg Patient Gender: M              HR:           77 bpm. Exam Location:  Inpatient Procedure: 2D Echo, Cardiac Doppler and Color Doppler Indications:    Congestive Heart Failure 428.0  History:        Patient has prior history of  Echocardiogram examinations, most                 recent 11/10/2017. [name] and COPD; Risk Factors:Hypertension and                 Current Smoker.  Sonographer:    Vickie Epley RDCS Referring Phys: [place]  1. Left ventricular ejection fraction, by estimation, is 60 to 65%. The left ventricle has normal function. The left ventrical has no regional wall motion abnormalities. There is mildly increased left ventricular hypertrophy. Left ventricular diastolic parameters are consistent with Grade II diastolic dysfunction (pseudonormalization). Elevated left ventricular end-diastolic pressure.  2. Right ventricular systolic function is normal. The right ventricular size is normal. There is mildly elevated pulmonary artery systolic pressure. The estimated right ventricular systolic pressure is [id number] mmHg.  3. Left atrial size was mild to moderately dilated.  4.  Mild mitral valve regurgitation.  5. The aortic valve is normal in structure and function. Aortic valve regurgitation is not visualized. No aortic stenosis is present.  6. The inferior vena cava is dilated in size with <50% respiratory variability, suggesting right atrial pressure of 15 mmHg. FINDINGS  Left Ventricle: Left ventricular ejection fraction, by estimation, is 60 to 65%. The left ventricle has normal function. The left ventricle has no regional wall motion abnormalities. The left ventricular internal cavity size was normal in size. There is  mildly increased left ventricular hypertrophy. Left ventricular diastolic parameters are consistent with Grade II diastolic dysfunction (pseudonormalization). Right Ventricle: The right ventricular size is normal. No increase in right ventricular wall thickness. Right ventricular systolic function is normal. There is mildly elevated pulmonary artery systolic pressure. The tricuspid regurgitant velocity is 2.37  m/s, and with an assumed right atrial pressure of 15 mmHg, the estimated right  ventricular systolic pressure is [id number] mmHg. Left Atrium: Left atrial size was mild to moderately dilated. Right Atrium: Right atrial size was normal in size. Pericardium: There is no evidence of pericardial effusion. Mitral Valve: The mitral valve is normal in structure and function. Normal mobility of the mitral valve leaflets. Moderate mitral annular calcification. Mild mitral valve regurgitation. No evidence of mitral valve stenosis. Tricuspid Valve: The tricuspid valve is normal in structure. Tricuspid valve regurgitation is mild . No evidence of tricuspid stenosis. Aortic Valve: The aortic valve is normal in structure and function. Aortic valve regurgitation is not visualized. No aortic stenosis is present. Pulmonic Valve: The pulmonic valve was normal in structure. Pulmonic valve regurgitation is not visualized. No evidence of pulmonic stenosis. Aorta: The aortic root is normal in size and structure. Venous: The inferior vena cava is dilated in size with less than 50% respiratory variability, suggesting right atrial pressure of 15 mmHg. The inferior vena cava and the hepatic vein show a normal flow pattern. IAS/Shunts: No atrial level shunt detected by color flow Doppler.  LEFT VENTRICLE PLAX 2D LVIDd:         4.50 cm       Diastology LVIDs:         3.40 cm       LV e' lateral:   4.79 cm/s LV PW:         1.20 cm       LV E/e' lateral: 18.5 LV IVS:        1.20 cm       LV e' medial:    4.57 cm/s LVOT diam:     1.70 cm       LV E/e' medial:  19.4 LV SV:         54.02 ml LV SV Index:   25.43 LVOT Area:     2.27 cm  LV Volumes (MOD) LV area d, A2C:    43.40 cm LV area d, A4C:    42.20 cm LV area s, A2C:    26.40 cm LV area s, A4C:    25.90 cm LV major d, A2C:   9.41 cm LV major d, A4C:   9.15 cm LV major s, A2C:   7.76 cm LV major s, A4C:   7.39 cm LV vol d, MOD A2C: 167.0 ml LV vol d, MOD A4C: 160.0 ml LV vol s, MOD A2C: 75.6 ml LV vol s, MOD A4C: 77.3 ml LV SV MOD A2C:     91.4 ml LV SV MOD A4C:     160.0  ml LV  SV MOD BP:      87.6 ml RIGHT VENTRICLE RV S prime:     15.00 cm/s TAPSE (M-mode): 2.4 cm LEFT ATRIUM             Index       RIGHT ATRIUM           Index LA diam:        4.20 cm 2.36 cm/m  RA Area:     15.90 cm LA Vol (A2C):   72.6 ml 40.85 ml/m RA Volume:   44.50 ml  25.04 ml/m LA Vol (A4C):   69.7 ml 39.22 ml/m LA Biplane Vol: 77.0 ml 43.33 ml/m  AORTIC VALVE LVOT Vmax:   137.00 cm/s LVOT Vmean:  86.900 cm/s LVOT VTI:    0.238 m  AORTA Ao Root diam: 2.90 cm MITRAL VALVE                        TRICUSPID VALVE MV Area (PHT): 3.63 cm             TR Peak grad:   22.5 mmHg MV Decel Time: 209 msec             TR Vmax:        237.00 cm/s MR Peak grad: 98.4 mmHg MR Vmax:      496.00 cm/s           SHUNTS MV E velocity: 88.80 cm/s 103 cm/s  Systemic VTI:  0.24 m MV A velocity: 79.60 cm/s 70.3 cm/s Systemic Diam: 1.70 cm MV E/A ratio:  1.12       1.5 Fransico Him MD Electronically signed by Fransico Him MD Signature Date/Time: 07/18/2019/10:28:13 AM    Final     Labs: BMET Recent Labs  Lab 07/17/19 1849 07/18/19 0529 07/19/19 0722  NA 141 140 139  K 4.6 4.3 4.4  CL 113* 110 110  CO2 16* 16* 16*  GLUCOSE 102* 110* 104*  BUN 45* 45* 54*  CREATININE 5.38* 5.23* 5.27*  CALCIUM 8.7* 8.9 8.9   CBC Recent Labs  Lab 07/17/19 1849 07/18/19 0529  WBC 9.1 11.4*  NEUTROABS  --  10.2*  HGB 11.8* 12.0*  HCT 37.7* 38.1*  MCV 100.3* 100.8*  PLT 181 182    Medications:    . amLODipine  10 mg Oral Daily  . carvedilol  6.25 mg Oral BID WC  . cyanocobalamin  1,000 mcg Intramuscular Q1400  . furosemide  80 mg Intravenous BID  . heparin  5,000 Units Subcutaneous Q8H  . hydrALAZINE  25 mg Oral Q8H  . influenza vaccine adjuvanted  0.5 mL Intramuscular Tomorrow-1000  . ipratropium-albuterol  3 mL Nebulization Q4H  . methylPREDNISolone (SOLU-MEDROL) injection  40 mg Intravenous Q8H  . mometasone-formoterol  2 puff Inhalation BID  . nicotine  7 mg Transdermal Daily  . potassium chloride  10 mEq  Oral BID      Darrelyn Hillock, DO  Family Medicine PGY-2  07/19/2019, 10:35 AM

## 2019-07-19 NOTE — Progress Notes (Addendum)
Progress Note  Patient Name: Albert Harris Date of Encounter: 07/19/2019  Primary Cardiologist: No primary care provider on file.   Subjective   Feeling better this morning. Breathing has much improved.   Inpatient Medications    Scheduled Meds:  amLODipine  10 mg Oral Daily   furosemide  80 mg Intravenous BID   heparin  5,000 Units Subcutaneous Q8H   hydrALAZINE  25 mg Oral Q8H   influenza vaccine adjuvanted  0.5 mL Intramuscular Tomorrow-1000   nicotine  7 mg Transdermal Daily   potassium chloride  10 mEq Oral BID   Continuous Infusions:   PRN Meds: acetaminophen **OR** acetaminophen, guaiFENesin-dextromethorphan, hydrALAZINE, nitroGLYCERIN   Vital Signs    Vitals:   07/19/19 0020 07/19/19 0144 07/19/19 0153 07/19/19 0437  BP: (!) 189/97 (!) 181/92 (!) 170/93 (!) 173/92  Pulse: 88 84 73 86  Resp:    18  Temp:    97.6 F (36.4 C)  TempSrc:    Oral  SpO2:  98% 98% 98%  Weight:    65.1 kg  Height:        Intake/Output Summary (Last 24 hours) at 07/19/2019 0850 Last data filed at 07/19/2019 [id number] Gross per 24 hour  Intake 1250 ml  Output 1725 ml  Net -475 ml   Last 3 Weights 07/19/2019 07/18/2019 07/17/2019  Weight (lbs) 143 lb 8.3 oz 143 lb 15.4 oz 145 lb  Weight (kg) 65.1 kg 65.3 kg 65.772 kg      Telemetry    NSR - Personally Reviewed  ECG    No new tracing this morning.   Physical Exam  Pleasant older WM GEN: No acute distress.   Neck: No JVD Cardiac: RRR, no murmurs, rubs, or gallops.  Respiratory: Expiratory wheezing bilaterally. GI: Soft, nontender, non-distended  MS: No edema; No deformity. Neuro:  Nonfocal  Psych: Normal affect   Labs    High Sensitivity Troponin:   Recent Labs  Lab 07/18/19 0153 07/18/19 0529 07/18/19 0717  TROPONINIHS 74* 61* 52*      Chemistry Recent Labs  Lab 07/17/19 1849 07/18/19 0529 07/19/19 0722  NA 141 140 139  K 4.6 4.3 4.4  CL 113* 110 110  CO2 16* 16* 16*  GLUCOSE 102* 110* 104*  BUN 45*  45* 54*  CREATININE 5.38* 5.23* 5.27*  CALCIUM 8.7* 8.9 8.9  PROT  --  6.2*  --   ALBUMIN  --  2.9*  --   AST  --  19  --   ALT  --  19  --   ALKPHOS  --  70  --   BILITOT  --  0.9  --   GFRNONAA 10* 11* 11*  GFRAA 12* 12* 12*  ANIONGAP 12 14 13      Hematology Recent Labs  Lab 07/17/19 1849 07/18/19 0529 07/18/19 0530  WBC 9.1 11.4*  --   RBC 3.76* 3.78* 3.51*  HGB 11.8* 12.0*  --   HCT 37.7* 38.1*  --   MCV 100.3* 100.8*  --   MCH 31.4 31.7  --   MCHC 31.3 31.5  --   RDW 14.4 14.4  --   PLT 181 182  --     BNP Recent Labs  Lab 07/18/19 0153  BNP 2,859.6*     DDimer No results for input(s): DDIMER in the last 168 hours.   Radiology    DG Chest 2 View  Result Date: 07/17/2019 CLINICAL DATA:  64 year old male with shortness of  breath. EXAM: CHEST - 2 VIEW COMPARISON:  Chest radiograph dated 11/09/2017. FINDINGS: There is mild diffuse interstitial coarsening and bronchitic changes. There is mild hyperexpansion of the lungs likely related to COPD. Minimal left lung base atelectasis/scarring. Mild blunting of the costophrenic angles likely related to hyperexpansion or scarring. Small pleural effusions are not excluded. No focal consolidation or pneumothorax. Stable cardiac silhouette. No acute osseous pathology. IMPRESSION: 1. Chronic hyperinflation, likely related to underlying COPD. 2. Bibasilar atelectasis/scarring versus less likely trace pleural effusions. Clinical correlation recommended. No focal consolidation. Electronically Signed   By: Anner Crete M.D.   On: 07/17/2019 19:01   US RENAL  Result Date: 07/18/2019 CLINICAL DATA:  Acute renal failure EXAM: RENAL / URINARY TRACT ULTRASOUND COMPLETE COMPARISON:  None. FINDINGS: Right Kidney: Renal measurements: 11 x 4.5 x 4.4 cm = volume: 112 mL . Echogenicity within normal limits. No mass or hydronephrosis visualized. Left Kidney: Renal measurements: 10 x 4.7 x 4.4 cm = volume: 111 mL. Echogenicity within normal  limits. No mass or hydronephrosis visualized. Bladder: Appears normal for degree of bladder distention. Other: Bilateral pleural effusion which is small based on preceding chest x-ray. IMPRESSION: 1. Negative renal ultrasound. 2. Bilateral pleural effusion, likely small volume. Electronically Signed   By: Monte Fantasia M.D.   On: 07/18/2019 07:07   ECHOCARDIOGRAM COMPLETE  Result Date: 07/18/2019    ECHOCARDIOGRAM REPORT   Patient Name:   Albert Harris Date of Exam: 07/18/2019 Medical Rec #:  [id number]      Height:       68.0 in Accession #:    [id number]     Weight:       144.0 lb Date of Birth:  [birth year]     BSA:          1.78 m Patient Age:    64 years       BP:           177/88 mmHg Patient Gender: M              HR:           77 bpm. Exam Location:  Inpatient Procedure: 2D Echo, Cardiac Doppler and Color Doppler Indications:    Congestive Heart Failure 428.0  History:        Patient has prior history of Echocardiogram examinations, most                 recent 11/10/2017. [name] and COPD; Risk Factors:Hypertension and                 Current Smoker.  Sonographer:    Vickie Epley RDCS Referring Phys: [place]  1. Left ventricular ejection fraction, by estimation, is 60 to 65%. The left ventricle has normal function. The left ventrical has no regional wall motion abnormalities. There is mildly increased left ventricular hypertrophy. Left ventricular diastolic parameters are consistent with Grade II diastolic dysfunction (pseudonormalization). Elevated left ventricular end-diastolic pressure.  2. Right ventricular systolic function is normal. The right ventricular size is normal. There is mildly elevated pulmonary artery systolic pressure. The estimated right ventricular systolic pressure is XX123456 mmHg.  3. Left atrial size was mild to moderately dilated.  4. Mild mitral valve regurgitation.  5. The aortic valve is normal in structure and function. Aortic valve regurgitation is not  visualized. No aortic stenosis is present.  6. The inferior vena cava is dilated in size with <50% respiratory variability, suggesting right atrial pressure of 15  mmHg. FINDINGS  Left Ventricle: Left ventricular ejection fraction, by estimation, is 60 to 65%. The left ventricle has normal function. The left ventricle has no regional wall motion abnormalities. The left ventricular internal cavity size was normal in size. There is  mildly increased left ventricular hypertrophy. Left ventricular diastolic parameters are consistent with Grade II diastolic dysfunction (pseudonormalization). Right Ventricle: The right ventricular size is normal. No increase in right ventricular wall thickness. Right ventricular systolic function is normal. There is mildly elevated pulmonary artery systolic pressure. The tricuspid regurgitant velocity is 2.37  m/s, and with an assumed right atrial pressure of 15 mmHg, the estimated right ventricular systolic pressure is [id number] mmHg. Left Atrium: Left atrial size was mild to moderately dilated. Right Atrium: Right atrial size was normal in size. Pericardium: There is no evidence of pericardial effusion. Mitral Valve: The mitral valve is normal in structure and function. Normal mobility of the mitral valve leaflets. Moderate mitral annular calcification. Mild mitral valve regurgitation. No evidence of mitral valve stenosis. Tricuspid Valve: The tricuspid valve is normal in structure. Tricuspid valve regurgitation is mild . No evidence of tricuspid stenosis. Aortic Valve: The aortic valve is normal in structure and function. Aortic valve regurgitation is not visualized. No aortic stenosis is present. Pulmonic Valve: The pulmonic valve was normal in structure. Pulmonic valve regurgitation is not visualized. No evidence of pulmonic stenosis. Aorta: The aortic root is normal in size and structure. Venous: The inferior vena cava is dilated in size with less than 50% respiratory variability,  suggesting right atrial pressure of 15 mmHg. The inferior vena cava and the hepatic vein show a normal flow pattern. IAS/Shunts: No atrial level shunt detected by color flow Doppler.  LEFT VENTRICLE PLAX 2D LVIDd:         4.50 cm       Diastology LVIDs:         3.40 cm       LV e' lateral:   4.79 cm/s LV PW:         1.20 cm       LV E/e' lateral: 18.5 LV IVS:        1.20 cm       LV e' medial:    4.57 cm/s LVOT diam:     1.70 cm       LV E/e' medial:  19.4 LV SV:         54.02 ml LV SV Index:   25.43 LVOT Area:     2.27 cm  LV Volumes (MOD) LV area d, A2C:    43.40 cm LV area d, A4C:    42.20 cm LV area s, A2C:    26.40 cm LV area s, A4C:    25.90 cm LV major d, A2C:   9.41 cm LV major d, A4C:   9.15 cm LV major s, A2C:   7.76 cm LV major s, A4C:   7.39 cm LV vol d, MOD A2C: 167.0 ml LV vol d, MOD A4C: 160.0 ml LV vol s, MOD A2C: 75.6 ml LV vol s, MOD A4C: 77.3 ml LV SV MOD A2C:     91.4 ml LV SV MOD A4C:     160.0 ml LV SV MOD BP:      87.6 ml RIGHT VENTRICLE RV S prime:     15.00 cm/s TAPSE (M-mode): 2.4 cm LEFT ATRIUM             Index       RIGHT  ATRIUM           Index LA diam:        4.20 cm 2.36 cm/m  RA Area:     15.90 cm LA Vol (A2C):   72.6 ml 40.85 ml/m RA Volume:   44.50 ml  25.04 ml/m LA Vol (A4C):   69.7 ml 39.22 ml/m LA Biplane Vol: 77.0 ml 43.33 ml/m  AORTIC VALVE LVOT Vmax:   137.00 cm/s LVOT Vmean:  86.900 cm/s LVOT VTI:    0.238 m  AORTA Ao Root diam: 2.90 cm MITRAL VALVE                        TRICUSPID VALVE MV Area (PHT): 3.63 cm             TR Peak grad:   22.5 mmHg MV Decel Time: 209 msec             TR Vmax:        237.00 cm/s MR Peak grad: 98.4 mmHg MR Vmax:      496.00 cm/s           SHUNTS MV E velocity: 88.80 cm/s 103 cm/s  Systemic VTI:  0.24 m MV A velocity: 79.60 cm/s 70.3 cm/s Systemic Diam: 1.70 cm MV E/A ratio:  1.12       1.5 Fransico Him MD Electronically signed by Fransico Him MD Signature Date/Time: 07/18/2019/10:28:13 AM    Final     Cardiac Studies   TTE:  07/18/19  IMPRESSIONS     1. Left ventricular ejection fraction, by estimation, is 60 to 65%. The  left ventricle has normal function. The left ventrical has no regional  wall motion abnormalities. There is mildly increased left ventricular  hypertrophy. Left ventricular diastolic  parameters are consistent with Grade II diastolic dysfunction  (pseudonormalization). Elevated left ventricular end-diastolic pressure.   2. Right ventricular systolic function is normal. The right ventricular  size is normal. There is mildly elevated pulmonary artery systolic  pressure. The estimated right ventricular systolic pressure is [id number] mmHg.   3. Left atrial size was mild to moderately dilated.   4. Mild mitral valve regurgitation.   5. The aortic valve is normal in structure and function. Aortic valve  regurgitation is not visualized. No aortic stenosis is present.   6. The inferior vena cava is dilated in size with <50% respiratory  variability, suggesting right atrial pressure of 15 mmHg.   Patient Profile     64 y.o. male with a hx of hypertension, HLD, [name], PFO with IAS, ongoing tobacco abuse with possible COPD and noncompliance who was seen for the evaluation of CHF.  Assessment & Plan    1. Acute diastolic heart failure:  Presented with greater than 2-week history of progressive worsening cough, shortness of breath, orthopnea and PND.  No lower extremity edema.  BNP greater than 2500.  Chest x-ray with COPD and less likely pleural effusion.  Negative renal ultrasound.  Echo this admission with normal EF and G2DD. UOP 1.7L overnight. Reports he did not use the urinal to collect all urine.  - continue on IV lasix 80mg  BID today, plan to reduce tomorrow. Maybe transition to oral dosing.    2.  AKI with possible CKD 3 stage III: Scr 5.38 on presentation. Remains at 5.2 today. Renal ultrasound negative. -Suspect related to NSAID use and uncontrolled HTN. -nephrology consult pending   3. PFO with  IAS: Previously recommendations transesophageal echocardiogram  in 2019 however never followed up. Can plan to follow up as outpatient.   4.  Hypertensive urgency: Upon arrival, he was noted hypertensive at 228/173.  -Continue amlodipine and hydralazine. Had been on coreg in the past, but stopped/ran out?  - start coreg 6.25mg  BID today.   5.  Elevated troponin: low flat trend, suspect demand in the setting of AKI, and uncontrolled hypertension.    6.  Ongoing tobacco abuse with possible COPD: Recommended complete tobacco cessation -Education given, seems poor insight   For questions or updates, please contact [place] Please consult [email or web address] for contact info under        Signed, Reino Bellis, NP  07/19/2019, 8:50 AM    Agree with note by Reino Bellis NP-C  Patient admitted yesterday with diastolic heart failure and acute on chronic renal sufficiency as well as hypertensive urgency.  He was given high-dose IV Lasix which he has diuresed from.  His blood pressure is much better controlled on current medications.  He feels clinically improved.  His EF is normal by 2D echo.  The renal service has yet to see him for his acute on chronic renal insufficiency.  Once his blood pressure is under better control we will sign off.   Lorretta Harp, M.D., [place], [facility], [place] [address] [place], [place]  [id number]  [phone] 07/19/2019 11:57 AM

## 2019-07-19 NOTE — Progress Notes (Signed)
Pt's BP remains high at 189/97 despite scheduled PO Hydralazine and PRN IV Hydralazine. MD paged. Will continue to monitor.

## 2019-07-19 NOTE — Progress Notes (Signed)
[name], male   DOB: 10-01-1955, 64 y.o.   MRN: [id number]  PROGRESS NOTE    Albert Harris  [id number] DOB: [birth year] DOA: 07/17/2019 PCP: Patient, No Pcp Per   Brief Narrative:  64 year old male with history of hypertension, COPD, ongoing tobacco abuse, chronic kidney disease stage III with creatinine around 1.3 in July 2019 presented with worsening shortness of breath.  In the ED, his blood pressure was 240/120 with chest x-ray showing trace bilateral pleural effusion along with elevated JVD and wheezing.  He was given intravenous Lasix.  Creatinine was found to be 5.38.  BNP was 2800.  Covid test was negative.  Cardiology was consulted.  Assessment & Plan:   Acute diastolic heart failure -Presented with worsening shortness of breath with BNP of 2800. -Cardiology following.  Currently on intravenous Lasix along with oral Coreg and hydralazine.  Strict input and output.  Daily weights.  Echo showed EF of 60 to 65% with grade 2 diastolic dysfunction. -Creatinine not improving.  Called nephrology consult.  Probable COPD exacerbation -Is currently wheezing as well.  Add Dulera and duo nebs.  Will start Solu-Medrol 40 mg IV every 8 hours.  Will need outpatient pulmonary evaluation.  Acute kidney injury on chronic kidney disease stage III Acute metabolic acidosis -Last creatinine 1.3 in July 2019 in our system. -Presented with creatinine of 5.38.  Currently making urine.  Renal ultrasound was negative for hydronephrosis.  Spoke to Dr. Lenna Sciara. Patel/nephrology will see the patient in consultation -Creatinine 5.27 today.  Monitor  Hypertensive urgency -Blood pressure still high but improving.  Continue Lasix, Coreg, hydralazine and amlodipine.  Chest pain -From uncontrolled blood pressure and CHF.  Much improved. -Troponins did not significantly trend up.  Follow cardiology recommendations  Microcytic anemia -Probably from vitamin B12 deficiency.  Probable vitamin B12  deficiency -Vitamin B12 level is 222 which is at the lower end of normal.  Will continue vitamin B12 supplementation while inpatient and discharged on oral supplement.  Tobacco abuse -Counseled regarding cessation   DVT prophylaxis: Heparin Code Status: Full Family Communication: Spoke to patient at bedside Disposition Plan: Still requiring intravenous Lasix and creatinine is still elevated.  Will need to remain inpatient for now.  Might need to stay at least 2 to 3 days in the hospital and will need PT eval.  Consultants: Cardiology/nephrology  Procedures:  Echo  1. Left ventricular ejection fraction, by estimation, is 60 to 65%. The  left ventricle has normal function. The left ventrical has no regional  wall motion abnormalities. There is mildly increased left ventricular  hypertrophy. Left ventricular diastolic  parameters are consistent with Grade II diastolic dysfunction  (pseudonormalization). Elevated left ventricular end-diastolic pressure.  2. Right ventricular systolic function is normal. The right ventricular  size is normal. There is mildly elevated pulmonary artery systolic  pressure. The estimated right ventricular systolic pressure is [id number] mmHg.  3. Left atrial size was mild to moderately dilated.  4. Mild mitral valve regurgitation.  5. The aortic valve is normal in structure and function. Aortic valve  regurgitation is not visualized. No aortic stenosis is present.  6. The inferior vena cava is dilated in size with <50% respiratory  variability, suggesting right atrial pressure of 15 mmHg.   Antimicrobials:  None  Subjective: Patient seen and examined at bedside.  He feels slightly better and states that he is urinating okay.  Denies any current chest pain.  Still short of breath with exertion.  No overnight  fever or vomiting.  Objective: Vitals:   07/19/19 0144 07/19/19 0153 07/19/19 0437 07/19/19 0920  BP: (!) 181/92 (!) 170/93 (!) 173/92 (!) 159/79   Pulse: 84 73 86 80  Resp:   18   Temp:   97.6 F (36.4 C) 98.3 F (36.8 C)  TempSrc:   Oral Oral  SpO2: 98% 98% 98% 99%  Weight:   65.1 kg   Height:        Intake/Output Summary (Last 24 hours) at 07/19/2019 1022 Last data filed at 07/19/2019 0933 Gross per 24 hour  Intake 1250 ml  Output 2025 ml  Net -775 ml   Filed Weights   07/17/19 1838 07/18/19 0659 07/19/19 0437  Weight: 65.8 kg 65.3 kg 65.1 kg    Examination:  General exam: Appears calm and comfortable.  Looks older than stated age.  No distress Respiratory system: Bilateral decreased breath sounds at bases with scattered wheezing and basilar crackles. Cardiovascular system: S1 & S2 heard, Rate controlled Gastrointestinal system: Abdomen is nondistended, soft and nontender. Normal bowel sounds heard. Extremities: No cyanosis, clubbing; trace lower extremity edema Central nervous system: Alert and oriented. No focal neurological deficits. Moving extremities Skin: No rashes, lesions or ulcers Psychiatry: Flat affect.    Data Reviewed: I have personally reviewed following labs and imaging studies  CBC: Recent Labs  Lab 07/17/19 1849 07/18/19 0529  WBC 9.1 11.4*  NEUTROABS  --  10.2*  HGB 11.8* 12.0*  HCT 37.7* 38.1*  MCV 100.3* 100.8*  PLT 181 [id number]   Basic Metabolic Panel: Recent Labs  Lab 07/17/19 1849 07/18/19 0529 07/19/19 0722  NA 141 140 139  K 4.6 4.3 4.4  CL 113* 110 110  CO2 16* 16* 16*  GLUCOSE 102* 110* 104*  BUN 45* 45* 54*  CREATININE 5.38* 5.23* 5.27*  CALCIUM 8.7* 8.9 8.9  MG  --  2.4  --    GFR: Estimated Creatinine Clearance: 12.9 mL/min (A) (by C-G formula based on SCr of 5.27 mg/dL (H)). Liver Function Tests: Recent Labs  Lab 07/18/19 0529  AST 19  ALT 19  ALKPHOS 70  BILITOT 0.9  PROT 6.2*  ALBUMIN 2.9*   No results for input(s): LIPASE, AMYLASE in the last 168 hours. No results for input(s): AMMONIA in the last 168 hours. Coagulation Profile: No results for  input(s): INR, PROTIME in the last 168 hours. Cardiac Enzymes: No results for input(s): CKTOTAL, CKMB, CKMBINDEX, TROPONINI in the last 168 hours. BNP (last 3 results) No results for input(s): PROBNP in the last 8760 hours. HbA1C: No results for input(s): HGBA1C in the last 72 hours. CBG: No results for input(s): GLUCAP in the last 168 hours. Lipid Profile: No results for input(s): CHOL, HDL, LDLCALC, TRIG, CHOLHDL, LDLDIRECT in the last 72 hours. Thyroid Function Tests: Recent Labs    07/18/19 0530  TSH 2.630   Anemia Panel: Recent Labs    07/18/19 0529 07/18/19 0530  VITAMINB12 222  --   FOLATE  --  12.6  FERRITIN 71  --   TIBC 322  --   IRON 65  --   RETICCTPCT  --  1.4   Sepsis Labs: No results for input(s): PROCALCITON, LATICACIDVEN in the last 168 hours.  Recent Results (from the past 240 hour(s))  Respiratory Panel by RT PCR (Flu A&B, Covid) - Nasopharyngeal Swab     Status: None   Collection Time: 07/18/19  2:06 AM   Specimen: Nasopharyngeal Swab  Result Value Ref Range Status  SARS Coronavirus 2 by RT PCR NEGATIVE NEGATIVE Final    Comment: (NOTE) SARS-CoV-2 target nucleic acids are NOT DETECTED. The SARS-CoV-2 RNA is generally detectable in upper respiratoy specimens during the acute phase of infection. The lowest concentration of SARS-CoV-2 viral copies this assay can detect is 131 copies/mL. A negative result does not preclude SARS-Cov-2 infection and should not be used as the sole basis for treatment or other patient management decisions. A negative result may occur with  improper specimen collection/handling, submission of specimen other than nasopharyngeal swab, presence of viral mutation(s) within the areas targeted by this assay, and inadequate number of viral copies (<131 copies/mL). A negative result must be combined with clinical observations, patient history, and epidemiological information. The expected result is Negative. Fact Sheet for  Patients:  PinkCheek.be Fact Sheet for Healthcare Providers:  GravelBags.it This test is not yet ap proved or cleared by the [place] FDA and  has been authorized for detection and/or diagnosis of SARS-CoV-2 by FDA under an Emergency Use Authorization (EUA). This EUA will remain  in effect (meaning this test can be used) for the duration of the COVID-19 declaration under Section 564(b)(1) of the Act, 21 U.S.C. section 360bbb-3(b)(1), unless the authorization is terminated or revoked sooner.    Influenza A by PCR NEGATIVE NEGATIVE Final   Influenza B by PCR NEGATIVE NEGATIVE Final    Comment: (NOTE) The Xpert Xpress SARS-CoV-2/FLU/RSV assay is intended as an aid in  the diagnosis of influenza from Nasopharyngeal swab specimens and  should not be used as a sole basis for treatment. Nasal washings and  aspirates are unacceptable for Xpert Xpress SARS-CoV-2/FLU/RSV  testing. Fact Sheet for Patients: PinkCheek.be Fact Sheet for Healthcare Providers: GravelBags.it This test is not yet approved or cleared by the [place] FDA and  has been authorized for detection and/or diagnosis of SARS-CoV-2 by  FDA under an Emergency Use Authorization (EUA). This EUA will remain  in effect (meaning this test can be used) for the duration of the  Covid-19 declaration under Section 564(b)(1) of the Act, 21  U.S.C. section 360bbb-3(b)(1), unless the authorization is  terminated or revoked. Performed at [facility], [place] [address] [place], [place] [address]          Radiology Studies: DG Chest 2 View  Result Date: 07/17/2019 CLINICAL DATA:  64 year old male with shortness of breath. EXAM: CHEST - 2 VIEW COMPARISON:  Chest radiograph dated 11/09/2017. FINDINGS: There is mild diffuse interstitial coarsening and bronchitic changes. There is mild hyperexpansion of the lungs  likely related to COPD. Minimal left lung base atelectasis/scarring. Mild blunting of the costophrenic angles likely related to hyperexpansion or scarring. Small pleural effusions are not excluded. No focal consolidation or pneumothorax. Stable cardiac silhouette. No acute osseous pathology. IMPRESSION: 1. Chronic hyperinflation, likely related to underlying COPD. 2. Bibasilar atelectasis/scarring versus less likely trace pleural effusions. Clinical correlation recommended. No focal consolidation. Electronically Signed   By: Anner Crete M.D.   On: 07/17/2019 19:01   US RENAL  Result Date: 07/18/2019 CLINICAL DATA:  Acute renal failure EXAM: RENAL / URINARY TRACT ULTRASOUND COMPLETE COMPARISON:  None. FINDINGS: Right Kidney: Renal measurements: 11 x 4.5 x 4.4 cm = volume: 112 mL . Echogenicity within normal limits. No mass or hydronephrosis visualized. Left Kidney: Renal measurements: 10 x 4.7 x 4.4 cm = volume: 111 mL. Echogenicity within normal limits. No mass or hydronephrosis visualized. Bladder: Appears normal for degree of bladder distention. Other: Bilateral pleural effusion which  is small based on preceding chest x-ray. IMPRESSION: 1. Negative renal ultrasound. 2. Bilateral pleural effusion, likely small volume. Electronically Signed   By: Monte Fantasia M.D.   On: 07/18/2019 07:07   ECHOCARDIOGRAM COMPLETE  Result Date: 07/18/2019    ECHOCARDIOGRAM REPORT   Patient Name:   Albert Harris Date of Exam: 07/18/2019 Medical Rec #:  [id number]      Height:       68.0 in Accession #:    [id number]     Weight:       144.0 lb Date of Birth:  1955-11-07     BSA:          1.78 m Patient Age:    84 years       BP:           177/88 mmHg Patient Gender: M              HR:           77 bpm. Exam Location:  Inpatient Procedure: 2D Echo, Cardiac Doppler and Color Doppler Indications:    Congestive Heart Failure 428.0  History:        Patient has prior history of Echocardiogram examinations, most                  recent 11/10/2017. [name] and COPD; Risk Factors:Hypertension and                 Current Smoker.  Sonographer:    Vickie Epley RDCS Referring Phys: [place]  1. Left ventricular ejection fraction, by estimation, is 60 to 65%. The left ventricle has normal function. The left ventrical has no regional wall motion abnormalities. There is mildly increased left ventricular hypertrophy. Left ventricular diastolic parameters are consistent with Grade II diastolic dysfunction (pseudonormalization). Elevated left ventricular end-diastolic pressure.  2. Right ventricular systolic function is normal. The right ventricular size is normal. There is mildly elevated pulmonary artery systolic pressure. The estimated right ventricular systolic pressure is XX123456 mmHg.  3. Left atrial size was mild to moderately dilated.  4. Mild mitral valve regurgitation.  5. The aortic valve is normal in structure and function. Aortic valve regurgitation is not visualized. No aortic stenosis is present.  6. The inferior vena cava is dilated in size with <50% respiratory variability, suggesting right atrial pressure of 15 mmHg. FINDINGS  Left Ventricle: Left ventricular ejection fraction, by estimation, is 60 to 65%. The left ventricle has normal function. The left ventricle has no regional wall motion abnormalities. The left ventricular internal cavity size was normal in size. There is  mildly increased left ventricular hypertrophy. Left ventricular diastolic parameters are consistent with Grade II diastolic dysfunction (pseudonormalization). Right Ventricle: The right ventricular size is normal. No increase in right ventricular wall thickness. Right ventricular systolic function is normal. There is mildly elevated pulmonary artery systolic pressure. The tricuspid regurgitant velocity is 2.37  m/s, and with an assumed right atrial pressure of 15 mmHg, the estimated right ventricular systolic pressure is [id number] mmHg. Left Atrium:  Left atrial size was mild to moderately dilated. Right Atrium: Right atrial size was normal in size. Pericardium: There is no evidence of pericardial effusion. Mitral Valve: The mitral valve is normal in structure and function. Normal mobility of the mitral valve leaflets. Moderate mitral annular calcification. Mild mitral valve regurgitation. No evidence of mitral valve stenosis. Tricuspid Valve: The tricuspid valve is normal in structure. Tricuspid valve regurgitation is mild . No  evidence of tricuspid stenosis. Aortic Valve: The aortic valve is normal in structure and function. Aortic valve regurgitation is not visualized. No aortic stenosis is present. Pulmonic Valve: The pulmonic valve was normal in structure. Pulmonic valve regurgitation is not visualized. No evidence of pulmonic stenosis. Aorta: The aortic root is normal in size and structure. Venous: The inferior vena cava is dilated in size with less than 50% respiratory variability, suggesting right atrial pressure of 15 mmHg. The inferior vena cava and the hepatic vein show a normal flow pattern. IAS/Shunts: No atrial level shunt detected by color flow Doppler.  LEFT VENTRICLE PLAX 2D LVIDd:         4.50 cm       Diastology LVIDs:         3.40 cm       LV e' lateral:   4.79 cm/s LV PW:         1.20 cm       LV E/e' lateral: 18.5 LV IVS:        1.20 cm       LV e' medial:    4.57 cm/s LVOT diam:     1.70 cm       LV E/e' medial:  19.4 LV SV:         54.02 ml LV SV Index:   25.43 LVOT Area:     2.27 cm  LV Volumes (MOD) LV area d, A2C:    43.40 cm LV area d, A4C:    42.20 cm LV area s, A2C:    26.40 cm LV area s, A4C:    25.90 cm LV major d, A2C:   9.41 cm LV major d, A4C:   9.15 cm LV major s, A2C:   7.76 cm LV major s, A4C:   7.39 cm LV vol d, MOD A2C: 167.0 ml LV vol d, MOD A4C: 160.0 ml LV vol s, MOD A2C: 75.6 ml LV vol s, MOD A4C: 77.3 ml LV SV MOD A2C:     91.4 ml LV SV MOD A4C:     160.0 ml LV SV MOD BP:      87.6 ml RIGHT VENTRICLE RV S prime:      15.00 cm/s TAPSE (M-mode): 2.4 cm LEFT ATRIUM             Index       RIGHT ATRIUM           Index LA diam:        4.20 cm 2.36 cm/m  RA Area:     15.90 cm LA Vol (A2C):   72.6 ml 40.85 ml/m RA Volume:   44.50 ml  25.04 ml/m LA Vol (A4C):   69.7 ml 39.22 ml/m LA Biplane Vol: 77.0 ml 43.33 ml/m  AORTIC VALVE LVOT Vmax:   137.00 cm/s LVOT Vmean:  86.900 cm/s LVOT VTI:    0.238 m  AORTA Ao Root diam: 2.90 cm MITRAL VALVE                        TRICUSPID VALVE MV Area (PHT): 3.63 cm             TR Peak grad:   22.5 mmHg MV Decel Time: 209 msec             TR Vmax:        237.00 cm/s MR Peak grad: 98.4 mmHg MR Vmax:      496.00 cm/s  SHUNTS MV E velocity: 88.80 cm/s 103 cm/s  Systemic VTI:  0.24 m MV A velocity: 79.60 cm/s 70.3 cm/s Systemic Diam: 1.70 cm MV E/A ratio:  1.12       1.5 Fransico Him MD Electronically signed by Fransico Him MD Signature Date/Time: 07/18/2019/10:28:13 AM    Final         Scheduled Meds: . amLODipine  10 mg Oral Daily  . carvedilol  6.25 mg Oral BID WC  . furosemide  80 mg Intravenous BID  . heparin  5,000 Units Subcutaneous Q8H  . hydrALAZINE  25 mg Oral Q8H  . influenza vaccine adjuvanted  0.5 mL Intramuscular Tomorrow-1000  . nicotine  7 mg Transdermal Daily  . potassium chloride  10 mEq Oral BID   Continuous Infusions:        Aline August, MD [facility] 07/19/2019, 10:22 AM

## 2019-07-20 ENCOUNTER — Ambulatory Visit (INDEPENDENT_AMBULATORY_CARE_PROVIDER_SITE_OTHER): Payer: Self-pay | Admitting: Primary Care

## 2019-07-20 DIAGNOSIS — I5032 Chronic diastolic (congestive) heart failure: Secondary | ICD-10-CM

## 2019-07-20 DIAGNOSIS — I5033 Acute on chronic diastolic (congestive) heart failure: Secondary | ICD-10-CM

## 2019-07-20 LAB — KAPPA/LAMBDA LIGHT CHAINS
Kappa free light chain: 101.7 mg/L — ABNORMAL HIGH (ref 3.3–19.4)
Kappa, lambda light chain ratio: 1.83 — ABNORMAL HIGH (ref 0.26–1.65)
Lambda free light chains: 55.6 mg/L — ABNORMAL HIGH (ref 5.7–26.3)

## 2019-07-20 LAB — RENAL FUNCTION PANEL
Albumin: 2.8 g/dL — ABNORMAL LOW (ref 3.5–5.0)
Anion gap: 15 (ref 5–15)
BUN: 67 mg/dL — ABNORMAL HIGH (ref 8–23)
CO2: 17 mmol/L — ABNORMAL LOW (ref 22–32)
Calcium: 8.9 mg/dL (ref 8.9–10.3)
Chloride: 105 mmol/L (ref 98–111)
Creatinine, Ser: 5.83 mg/dL — ABNORMAL HIGH (ref 0.61–1.24)
GFR calc Af Amer: 11 mL/min — ABNORMAL LOW (ref >60)
GFR calc non Af Amer: 9 mL/min — ABNORMAL LOW (ref >60)
Glucose, Bld: 134 mg/dL — ABNORMAL HIGH (ref 70–99)
Phosphorus: 5.6 mg/dL — ABNORMAL HIGH (ref 2.5–4.6)
Potassium: 5.3 mmol/L — ABNORMAL HIGH (ref 3.5–5.1)
Sodium: 137 mmol/L (ref 135–145)

## 2019-07-20 LAB — PROTEIN ELECTROPHORESIS, SERUM
A/G Ratio: 1.2 (ref 0.7–1.7)
Albumin ELP: 3 g/dL (ref 2.9–4.4)
Alpha-1-Globulin: 0.3 g/dL (ref 0.0–0.4)
Alpha-2-Globulin: 0.7 g/dL (ref 0.4–1.0)
Beta Globulin: 0.9 g/dL (ref 0.7–1.3)
Gamma Globulin: 0.6 g/dL (ref 0.4–1.8)
Globulin, Total: 2.5 g/dL (ref 2.2–3.9)
Total Protein ELP: 5.5 g/dL — ABNORMAL LOW (ref 6.0–8.5)

## 2019-07-20 LAB — ANA: Anti Nuclear Antibody (ANA): NEGATIVE

## 2019-07-20 LAB — MAGNESIUM: Magnesium: 2.3 mg/dL (ref 1.7–2.4)

## 2019-07-20 MED ORDER — FUROSEMIDE 40 MG PO TABS
40.0000 mg | ORAL_TABLET | Freq: Every day | ORAL | Status: DC
  Administered 2019-07-21: 40 mg via ORAL
  Filled 2019-07-20: qty 1

## 2019-07-20 MED ORDER — METHYLPREDNISOLONE SODIUM SUCC 40 MG IJ SOLR
40.0000 mg | Freq: Two times a day (BID) | INTRAMUSCULAR | Status: DC
  Administered 2019-07-20 – 2019-07-21 (×2): 40 mg via INTRAVENOUS
  Filled 2019-07-20 (×2): qty 1

## 2019-07-20 NOTE — Progress Notes (Signed)
Progress Note  Patient Name: Albert Harris Date of Encounter: 07/20/2019  Primary Cardiologist: Dr. Sallyanne Kuster  Subjective   Feeling better this morning. Breathing has much improved.   Inpatient Medications    Scheduled Meds: . amLODipine  10 mg Oral Daily  . carvedilol  6.25 mg Oral BID WC  . cyanocobalamin  1,000 mcg Intramuscular Q1400  . furosemide  80 mg Intravenous BID  . heparin  5,000 Units Subcutaneous Q8H  . hydrALAZINE  25 mg Oral Q8H  . influenza vaccine adjuvanted  0.5 mL Intramuscular Tomorrow-1000  . methylPREDNISolone (SOLU-MEDROL) injection  40 mg Intravenous Q12H  . mometasone-formoterol  2 puff Inhalation BID  . nicotine  7 mg Transdermal Daily  . potassium chloride  10 mEq Oral BID   Continuous Infusions:  PRN Meds: acetaminophen **OR** acetaminophen, albuterol, guaiFENesin-dextromethorphan, hydrALAZINE, nitroGLYCERIN   Vital Signs    Vitals:   07/20/19 0242 07/20/19 0646 07/20/19 0806 07/20/19 0806  BP: (!) 166/96 (!) 164/93  (!) 143/72  Pulse: 74 74  74  Resp: 20 18  18   Temp: 97.8 F (36.6 C) 97.9 F (36.6 C)  98.2 F (36.8 C)  TempSrc: Oral Oral  Oral  SpO2: 98% 97% 96% 96%  Weight:  64.3 kg    Height:        Intake/Output Summary (Last 24 hours) at 07/20/2019 0931 Last data filed at 07/20/2019 0849 Gross per 24 hour  Intake 1320 ml  Output 2100 ml  Net -780 ml   Last 3 Weights 07/20/2019 07/19/2019 07/18/2019  Weight (lbs) 141 lb 11.2 oz 143 lb 8.3 oz 143 lb 15.4 oz  Weight (kg) 64.275 kg 65.1 kg 65.3 kg      Telemetry    NSR - Personally Reviewed  ECG    No new tracing this morning.   Physical Exam  Pleasant older WM GEN: No acute distress.   Neck: No JVD Cardiac: RRR, no murmurs, rubs, or gallops.  Respiratory: Expiratory wheezing bilaterally. GI: Soft, nontender, non-distended  MS: No edema; No deformity. Neuro:  Nonfocal  Psych: Normal affect   Labs    High Sensitivity Troponin:   Recent Labs  Lab  07/18/19 0153 07/18/19 0529 07/18/19 0717  TROPONINIHS 74* 61* 52*      Chemistry Recent Labs  Lab 07/18/19 0529 07/19/19 0722 07/20/19 0435  NA 140 139 137  K 4.3 4.4 5.3*  CL 110 110 105  CO2 16* 16* 17*  GLUCOSE 110* 104* 134*  BUN 45* 54* 67*  CREATININE 5.23* 5.27* 5.83*  CALCIUM 8.9 8.9 8.9  PROT 6.2*  --   --   ALBUMIN 2.9*  --  2.8*  AST 19  --   --   ALT 19  --   --   ALKPHOS 70  --   --   BILITOT 0.9  --   --   GFRNONAA 11* 11* 9*  GFRAA 12* 12* 11*  ANIONGAP 14 13 15      Hematology Recent Labs  Lab 07/17/19 1849 07/18/19 0529 07/18/19 0530  WBC 9.1 11.4*  --   RBC 3.76* 3.78* 3.51*  HGB 11.8* 12.0*  --   HCT 37.7* 38.1*  --   MCV 100.3* 100.8*  --   MCH 31.4 31.7  --   MCHC 31.3 31.5  --   RDW 14.4 14.4  --   PLT 181 182  --     BNP Recent Labs  Lab 07/18/19 0153  BNP 2,859.6*  DDimer No results for input(s): DDIMER in the last 168 hours.   Radiology    ECHOCARDIOGRAM COMPLETE  Result Date: 07/18/2019    ECHOCARDIOGRAM REPORT   Patient Name:   [name] Date of Exam: 07/18/2019 Medical Rec #:  [id number]      Height:       68.0 in Accession #:    [id number]     Weight:       144.0 lb Date of Birth:  01-Apr-1956     BSA:          1.78 m Patient Age:    4 years       BP:           177/88 mmHg Patient Gender: M              HR:           77 bpm. Exam Location:  Inpatient Procedure: 2D Echo, Cardiac Doppler and Color Doppler Indications:    Congestive Heart Failure 428.0  History:        Patient has prior history of Echocardiogram examinations, most                 recent 11/10/2017. [name] and COPD; Risk Factors:Hypertension and                 Current Smoker.  Sonographer:    Vickie Epley RDCS Referring Phys: [place]  1. Left ventricular ejection fraction, by estimation, is 60 to 65%. The left ventricle has normal function. The left ventrical has no regional wall motion abnormalities. There is mildly increased left  ventricular hypertrophy. Left ventricular diastolic parameters are consistent with Grade II diastolic dysfunction (pseudonormalization). Elevated left ventricular end-diastolic pressure.  2. Right ventricular systolic function is normal. The right ventricular size is normal. There is mildly elevated pulmonary artery systolic pressure. The estimated right ventricular systolic pressure is XX123456 mmHg.  3. Left atrial size was mild to moderately dilated.  4. Mild mitral valve regurgitation.  5. The aortic valve is normal in structure and function. Aortic valve regurgitation is not visualized. No aortic stenosis is present.  6. The inferior vena cava is dilated in size with <50% respiratory variability, suggesting right atrial pressure of 15 mmHg. FINDINGS  Left Ventricle: Left ventricular ejection fraction, by estimation, is 60 to 65%. The left ventricle has normal function. The left ventricle has no regional wall motion abnormalities. The left ventricular internal cavity size was normal in size. There is  mildly increased left ventricular hypertrophy. Left ventricular diastolic parameters are consistent with Grade II diastolic dysfunction (pseudonormalization). Right Ventricle: The right ventricular size is normal. No increase in right ventricular wall thickness. Right ventricular systolic function is normal. There is mildly elevated pulmonary artery systolic pressure. The tricuspid regurgitant velocity is 2.37  m/s, and with an assumed right atrial pressure of 15 mmHg, the estimated right ventricular systolic pressure is [id number] mmHg. Left Atrium: Left atrial size was mild to moderately dilated. Right Atrium: Right atrial size was normal in size. Pericardium: There is no evidence of pericardial effusion. Mitral Valve: The mitral valve is normal in structure and function. Normal mobility of the mitral valve leaflets. Moderate mitral annular calcification. Mild mitral valve regurgitation. No evidence of mitral valve  stenosis. Tricuspid Valve: The tricuspid valve is normal in structure. Tricuspid valve regurgitation is mild . No evidence of tricuspid stenosis. Aortic Valve: The aortic valve is normal in structure and function. Aortic valve regurgitation  is not visualized. No aortic stenosis is present. Pulmonic Valve: The pulmonic valve was normal in structure. Pulmonic valve regurgitation is not visualized. No evidence of pulmonic stenosis. Aorta: The aortic root is normal in size and structure. Venous: The inferior vena cava is dilated in size with less than 50% respiratory variability, suggesting right atrial pressure of 15 mmHg. The inferior vena cava and the hepatic vein show a normal flow pattern. IAS/Shunts: No atrial level shunt detected by color flow Doppler.  LEFT VENTRICLE PLAX 2D LVIDd:         4.50 cm       Diastology LVIDs:         3.40 cm       LV e' lateral:   4.79 cm/s LV PW:         1.20 cm       LV E/e' lateral: 18.5 LV IVS:        1.20 cm       LV e' medial:    4.57 cm/s LVOT diam:     1.70 cm       LV E/e' medial:  19.4 LV SV:         54.02 ml LV SV Index:   25.43 LVOT Area:     2.27 cm  LV Volumes (MOD) LV area d, A2C:    43.40 cm LV area d, A4C:    42.20 cm LV area s, A2C:    26.40 cm LV area s, A4C:    25.90 cm LV major d, A2C:   9.41 cm LV major d, A4C:   9.15 cm LV major s, A2C:   7.76 cm LV major s, A4C:   7.39 cm LV vol d, MOD A2C: 167.0 ml LV vol d, MOD A4C: 160.0 ml LV vol s, MOD A2C: 75.6 ml LV vol s, MOD A4C: 77.3 ml LV SV MOD A2C:     91.4 ml LV SV MOD A4C:     160.0 ml LV SV MOD BP:      87.6 ml RIGHT VENTRICLE RV S prime:     15.00 cm/s TAPSE (M-mode): 2.4 cm LEFT ATRIUM             Index       RIGHT ATRIUM           Index LA diam:        4.20 cm 2.36 cm/m  RA Area:     15.90 cm LA Vol (A2C):   72.6 ml 40.85 ml/m RA Volume:   44.50 ml  25.04 ml/m LA Vol (A4C):   69.7 ml 39.22 ml/m LA Biplane Vol: 77.0 ml 43.33 ml/m  AORTIC VALVE LVOT Vmax:   137.00 cm/s LVOT Vmean:  86.900 cm/s LVOT  VTI:    0.238 m  AORTA Ao Root diam: 2.90 cm MITRAL VALVE                        TRICUSPID VALVE MV Area (PHT): 3.63 cm             TR Peak grad:   22.5 mmHg MV Decel Time: 209 msec             TR Vmax:        237.00 cm/s MR Peak grad: 98.4 mmHg MR Vmax:      496.00 cm/s           SHUNTS MV E velocity: 88.80 cm/s 103 cm/s  Systemic  VTI:  0.24 m MV A velocity: 79.60 cm/s 70.3 cm/s Systemic Diam: 1.70 cm MV E/A ratio:  1.12       1.5 Fransico Him MD Electronically signed by Fransico Him MD Signature Date/Time: 07/18/2019/10:28:13 AM    Final     Cardiac Studies   TTE: 07/18/19  IMPRESSIONS     1. Left ventricular ejection fraction, by estimation, is 60 to 65%. The  left ventricle has normal function. The left ventrical has no regional  wall motion abnormalities. There is mildly increased left ventricular  hypertrophy. Left ventricular diastolic  parameters are consistent with Grade II diastolic dysfunction  (pseudonormalization). Elevated left ventricular end-diastolic pressure.   2. Right ventricular systolic function is normal. The right ventricular  size is normal. There is mildly elevated pulmonary artery systolic  pressure. The estimated right ventricular systolic pressure is [id number] mmHg.   3. Left atrial size was mild to moderately dilated.   4. Mild mitral valve regurgitation.   5. The aortic valve is normal in structure and function. Aortic valve  regurgitation is not visualized. No aortic stenosis is present.   6. The inferior vena cava is dilated in size with <50% respiratory  variability, suggesting right atrial pressure of 15 mmHg.   Patient Profile     64 y.o. male with a hx of hypertension, HLD, [name], PFO with IAS, ongoing tobacco abuse with possible COPD and noncompliance who was seen for the evaluation of CHF.  Assessment & Plan    1. Acute diastolic heart failure:  Presented with greater than 2-week history of progressive worsening cough, shortness of breath, orthopnea and  PND.  No lower extremity edema.  BNP greater than 2500.  Chest x-ray with COPD and less likely pleural effusion.  Negative renal ultrasound.  Echo this admission with normal EF and G2DD.  I/O's -1.5 L..   -Currently on Lasix 80 mg IV twice daily.  Will defer diuretic dosing to the renal or primary service.   2.  AKI with possible CKD 3 stage III: Scr 5.38 on presentation.  Currently 5.83.  Remains at 5.2 today. Renal ultrasound negative. -Suspect related to NSAID use and uncontrolled HTN. -Nephrology on board   3. PFO with IAS: Previously recommendations transesophageal echocardiogram in 2019 however never followed up. Can plan to follow up as outpatient.   4.  Hypertensive urgency: Upon arrival, he was noted hypertensive at 228/173.   Blood pressure much better since being on his current medical regimen in the 140/70 range. -Continue amlodipine and hydralazine. Had been on coreg in the past, but stopped/ran out?  - start coreg 6.25mg  BID today.   5.  Elevated troponin: low flat trend, suspect demand in the setting of AKI, and uncontrolled hypertension.    6.  Ongoing tobacco abuse with possible COPD: Recommended complete tobacco cessation -Education given, seems poor insight.  Currently wearing a NicoDerm patch  Patient clinically improved on current medical regimen.  Will sign off for now and arrange follow-up with Dr. Sallyanne Kuster.  He had diastolic heart failure and hypertensive urgency improved with diuresis and medication adjustment.  I agree that he may ultimately require renal replacement therapy.   For questions or updates, please contact [place] Please consult [email or web address] for contact info under        Lorretta Harp, M.D., [place], [facility], [place], [place] [address] [place], [place]  [address]  [phone] 07/20/2019 9:35 AM    Lorretta Harp, M.D.,  FACP, [place], FAHA, [place] [address] [place], [place]  [address]  [phone] 07/20/2019 9:31 AM

## 2019-07-20 NOTE — Progress Notes (Signed)
Patient ID: FERRON KARLE, male   DOB: [birth year], 64 y.o.   MRN: [id number]  PROGRESS NOTE    MARNE AUSLEY  [id number] DOB: [birth year] DOA: 07/17/2019 PCP: Patient, No Pcp Per   Brief Narrative:  64 year old male with history of hypertension, COPD, ongoing tobacco abuse, chronic kidney disease stage III with creatinine around 1.3 in July 2019 presented with worsening shortness of breath.  In the ED, his blood pressure was 240/120 with chest x-ray showing trace bilateral pleural effusion along with elevated JVD and wheezing.  He was given intravenous Lasix.  Creatinine was found to be 5.38.  BNP was 2800.  Covid test was negative.  Cardiology was consulted.  Nephrology was subsequently consulted.  Assessment & Plan:   Acute diastolic heart failure -Presented with worsening shortness of breath with BNP of 2800. -Cardiology following.  Currently on intravenous Lasix along with oral Coreg and hydralazine.  Strict input and output.  Daily weights.  Fluid restriction.  Echo showed EF of 60 to 65% with grade 2 diastolic dysfunction. -Monitor creatinine.  Probable COPD exacerbation -Continue Dulera and DuoNebs. Solu-Medrol to 40 mg IV every 12 hours.  Will need outpatient pulmonary evaluation.  Acute kidney injury on chronic kidney disease stage III Acute metabolic acidosis -Last creatinine 1.3 in July 2019 in our system. -Presented with creatinine of 5.38.  Currently making urine.  Renal ultrasound was negative for hydronephrosis.  Nephrology following. -Creatinine 5.83 today.  Monitor  Hypertensive urgency -Blood pressure still high but improving.  Continue Lasix, Coreg, hydralazine and amlodipine.  Chest pain -From uncontrolled blood pressure and CHF.  Much improved. -Troponins did not significantly trend up.  Follow cardiology recommendations  Microcytic anemia -Probably from vitamin B12 deficiency.  Probable vitamin B12 deficiency -Vitamin B12 level is 222 which is at the  lower end of normal.  Will continue vitamin B12 supplementation while inpatient and discharged on oral supplement.  Tobacco abuse -Counseled regarding cessation   DVT prophylaxis: Heparin Code Status: Full Family Communication: Spoke to patient at bedside Disposition Plan: Still requiring intravenous Lasix and creatinine is still elevated.  Will need to remain inpatient for now.  Might need to stay at least 2 to 3 days in the hospital and will need PT eval.  Consultants: Cardiology/nephrology  Procedures:  Echo  1. Left ventricular ejection fraction, by estimation, is 60 to 65%. The  left ventricle has normal function. The left ventrical has no regional  wall motion abnormalities. There is mildly increased left ventricular  hypertrophy. Left ventricular diastolic  parameters are consistent with Grade II diastolic dysfunction  (pseudonormalization). Elevated left ventricular end-diastolic pressure.  2. Right ventricular systolic function is normal. The right ventricular  size is normal. There is mildly elevated pulmonary artery systolic  pressure. The estimated right ventricular systolic pressure is [id number] mmHg.  3. Left atrial size was mild to moderately dilated.  4. Mild mitral valve regurgitation.  5. The aortic valve is normal in structure and function. Aortic valve  regurgitation is not visualized. No aortic stenosis is present.  6. The inferior vena cava is dilated in size with <50% respiratory  variability, suggesting right atrial pressure of 15 mmHg.   Antimicrobials:  None  Subjective: Patient seen and examined at bedside.  Still feels short of breath with exertion.  No overnight fever, nausea or vomiting.  Objective: Vitals:   07/19/19 2023 07/20/19 0104 07/20/19 0242 07/20/19 0646  BP:  (!) 149/80 (!) 166/96 (!) 164/93  Pulse:  72 74 74  Resp:  20 20 18   Temp:  98 F (36.7 C) 97.8 F (36.6 C) 97.9 F (36.6 C)  TempSrc:  Oral Oral Oral  SpO2: 98% 98% 98%  97%  Weight:    64.3 kg  Height:        Intake/Output Summary (Last 24 hours) at 07/20/2019 0724 Last data filed at 07/20/2019 0649 Gross per 24 hour  Intake 960 ml  Output 1900 ml  Net -940 ml   Filed Weights   07/18/19 0659 07/19/19 0437 07/20/19 0646  Weight: 65.3 kg 65.1 kg 64.3 kg    Examination:  General exam: No acute distress.  Looks older than stated age.  Respiratory system: Bilateral decreased breath sounds at bases with basilar crackles.  Mild wheezing  cardiovascular system: Rate controlled, S1-S2 heard Gastrointestinal system: Abdomen is nondistended, soft and nontender.  Bowel sounds are heard.   Extremities: No clubbing or cyanosis.  Trace bilateral lower extremity edema Central nervous system: Awake and alert.  No focal neurological deficits. Moving extremities Skin: No ulcers or lesions. Psychiatry: Has flat affect.    Data Reviewed: I have personally reviewed following labs and imaging studies  CBC: Recent Labs  Lab 07/17/19 1849 07/18/19 0529  WBC 9.1 11.4*  NEUTROABS  --  10.2*  HGB 11.8* 12.0*  HCT 37.7* 38.1*  MCV 100.3* 100.8*  PLT 181 [id number]   Basic Metabolic Panel: Recent Labs  Lab 07/17/19 1849 07/18/19 0529 07/19/19 0722 07/20/19 0435  NA 141 140 139 137  K 4.6 4.3 4.4 5.3*  CL 113* 110 110 105  CO2 16* 16* 16* 17*  GLUCOSE 102* 110* 104* 134*  BUN 45* 45* 54* 67*  CREATININE 5.38* 5.23* 5.27* 5.83*  CALCIUM 8.7* 8.9 8.9 8.9  MG  --  2.4  --  2.3  PHOS  --   --   --  5.6*   GFR: Estimated Creatinine Clearance: 11.5 mL/min (A) (by C-G formula based on SCr of 5.83 mg/dL (H)). Liver Function Tests: Recent Labs  Lab 07/18/19 0529 07/20/19 0435  AST 19  --   ALT 19  --   ALKPHOS 70  --   BILITOT 0.9  --   PROT 6.2*  --   ALBUMIN 2.9* 2.8*   No results for input(s): LIPASE, AMYLASE in the last 168 hours. No results for input(s): AMMONIA in the last 168 hours. Coagulation Profile: No results for input(s): INR, PROTIME in  the last 168 hours. Cardiac Enzymes: No results for input(s): CKTOTAL, CKMB, CKMBINDEX, TROPONINI in the last 168 hours. BNP (last 3 results) No results for input(s): PROBNP in the last 8760 hours. HbA1C: No results for input(s): HGBA1C in the last 72 hours. CBG: No results for input(s): GLUCAP in the last 168 hours. Lipid Profile: No results for input(s): CHOL, HDL, LDLCALC, TRIG, CHOLHDL, LDLDIRECT in the last 72 hours. Thyroid Function Tests: Recent Labs    07/18/19 0530  TSH 2.630   Anemia Panel: Recent Labs    07/18/19 0529 07/18/19 0530  VITAMINB12 222  --   FOLATE  --  12.6  FERRITIN 71  --   TIBC 322  --   IRON 65  --   RETICCTPCT  --  1.4   Sepsis Labs: No results for input(s): PROCALCITON, LATICACIDVEN in the last 168 hours.  Recent Results (from the past 240 hour(s))  Respiratory Panel by RT PCR (Flu A&B, Covid) - Nasopharyngeal Swab     Status: None   Collection Time: 07/18/19  2:06 AM   Specimen: Nasopharyngeal Swab  Result Value Ref Range Status   SARS Coronavirus 2 by RT PCR NEGATIVE NEGATIVE Final    Comment: (NOTE) SARS-CoV-2 target nucleic acids are NOT DETECTED. The SARS-CoV-2 RNA is generally detectable in upper respiratoy specimens during the acute phase of infection. The lowest concentration of SARS-CoV-2 viral copies this assay can detect is 131 copies/mL. A negative result does not preclude SARS-Cov-2 infection and should not be used as the sole basis for treatment or other patient management decisions. A negative result may occur with  improper specimen collection/handling, submission of specimen other than nasopharyngeal swab, presence of viral mutation(s) within the areas targeted by this assay, and inadequate number of viral copies (<131 copies/mL). A negative result must be combined with clinical observations, patient history, and epidemiological information. The expected result is Negative. Fact Sheet for Patients:    PinkCheek.be Fact Sheet for Healthcare Providers:  GravelBags.it This test is not yet ap proved or cleared by the [place] FDA and  has been authorized for detection and/or diagnosis of SARS-CoV-2 by FDA under an Emergency Use Authorization (EUA). This EUA will remain  in effect (meaning this test can be used) for the duration of the COVID-19 declaration under Section 564(b)(1) of the Act, 21 U.S.C. section 360bbb-3(b)(1), unless the authorization is terminated or revoked sooner.    Influenza A by PCR NEGATIVE NEGATIVE Final   Influenza B by PCR NEGATIVE NEGATIVE Final    Comment: (NOTE) The Xpert Xpress SARS-CoV-2/FLU/RSV assay is intended as an aid in  the diagnosis of influenza from Nasopharyngeal swab specimens and  should not be used as a sole basis for treatment. Nasal washings and  aspirates are unacceptable for Xpert Xpress SARS-CoV-2/FLU/RSV  testing. Fact Sheet for Patients: PinkCheek.be Fact Sheet for Healthcare Providers: GravelBags.it This test is not yet approved or cleared by the [place] FDA and  has been authorized for detection and/or diagnosis of SARS-CoV-2 by  FDA under an Emergency Use Authorization (EUA). This EUA will remain  in effect (meaning this test can be used) for the duration of the  Covid-19 declaration under Section 564(b)(1) of the Act, 21  U.S.C. section 360bbb-3(b)(1), unless the authorization is  terminated or revoked. Performed at [facility], [place] [address] [place], [place] [address]          Radiology Studies: ECHOCARDIOGRAM COMPLETE  Result Date: 07/18/2019    ECHOCARDIOGRAM REPORT   Patient Name:   LEONCE JETT Date of Exam: 07/18/2019 Medical Rec #:  [id number]      Height:       68.0 in Accession #:    [id number]     Weight:       144.0 lb Date of Birth:  12-27-1955     BSA:          1.78 m Patient  Age:    43 years       BP:           177/88 mmHg Patient Gender: M              HR:           77 bpm. Exam Location:  Inpatient Procedure: 2D Echo, Cardiac Doppler and Color Doppler Indications:    Congestive Heart Failure 428.0  History:        Patient has prior history of Echocardiogram examinations, most  recent 11/10/2017. [name] and COPD; Risk Factors:Hypertension and                 Current Smoker.  Sonographer:    Vickie Epley RDCS Referring Phys: [place]  1. Left ventricular ejection fraction, by estimation, is 60 to 65%. The left ventricle has normal function. The left ventrical has no regional wall motion abnormalities. There is mildly increased left ventricular hypertrophy. Left ventricular diastolic parameters are consistent with Grade II diastolic dysfunction (pseudonormalization). Elevated left ventricular end-diastolic pressure.  2. Right ventricular systolic function is normal. The right ventricular size is normal. There is mildly elevated pulmonary artery systolic pressure. The estimated right ventricular systolic pressure is XX123456 mmHg.  3. Left atrial size was mild to moderately dilated.  4. Mild mitral valve regurgitation.  5. The aortic valve is normal in structure and function. Aortic valve regurgitation is not visualized. No aortic stenosis is present.  6. The inferior vena cava is dilated in size with <50% respiratory variability, suggesting right atrial pressure of 15 mmHg. FINDINGS  Left Ventricle: Left ventricular ejection fraction, by estimation, is 60 to 65%. The left ventricle has normal function. The left ventricle has no regional wall motion abnormalities. The left ventricular internal cavity size was normal in size. There is  mildly increased left ventricular hypertrophy. Left ventricular diastolic parameters are consistent with Grade II diastolic dysfunction (pseudonormalization). Right Ventricle: The right ventricular size is normal. No increase  in right ventricular wall thickness. Right ventricular systolic function is normal. There is mildly elevated pulmonary artery systolic pressure. The tricuspid regurgitant velocity is 2.37  m/s, and with an assumed right atrial pressure of 15 mmHg, the estimated right ventricular systolic pressure is [id number] mmHg. Left Atrium: Left atrial size was mild to moderately dilated. Right Atrium: Right atrial size was normal in size. Pericardium: There is no evidence of pericardial effusion. Mitral Valve: The mitral valve is normal in structure and function. Normal mobility of the mitral valve leaflets. Moderate mitral annular calcification. Mild mitral valve regurgitation. No evidence of mitral valve stenosis. Tricuspid Valve: The tricuspid valve is normal in structure. Tricuspid valve regurgitation is mild . No evidence of tricuspid stenosis. Aortic Valve: The aortic valve is normal in structure and function. Aortic valve regurgitation is not visualized. No aortic stenosis is present. Pulmonic Valve: The pulmonic valve was normal in structure. Pulmonic valve regurgitation is not visualized. No evidence of pulmonic stenosis. Aorta: The aortic root is normal in size and structure. Venous: The inferior vena cava is dilated in size with less than 50% respiratory variability, suggesting right atrial pressure of 15 mmHg. The inferior vena cava and the hepatic vein show a normal flow pattern. IAS/Shunts: No atrial level shunt detected by color flow Doppler.  LEFT VENTRICLE PLAX 2D LVIDd:         4.50 cm       Diastology LVIDs:         3.40 cm       LV e' lateral:   4.79 cm/s LV PW:         1.20 cm       LV E/e' lateral: 18.5 LV IVS:        1.20 cm       LV e' medial:    4.57 cm/s LVOT diam:     1.70 cm       LV E/e' medial:  19.4 LV SV:         54.02 ml LV  SV Index:   25.43 LVOT Area:     2.27 cm  LV Volumes (MOD) LV area d, A2C:    43.40 cm LV area d, A4C:    42.20 cm LV area s, A2C:    26.40 cm LV area s, A4C:    25.90 cm LV  major d, A2C:   9.41 cm LV major d, A4C:   9.15 cm LV major s, A2C:   7.76 cm LV major s, A4C:   7.39 cm LV vol d, MOD A2C: 167.0 ml LV vol d, MOD A4C: 160.0 ml LV vol s, MOD A2C: 75.6 ml LV vol s, MOD A4C: 77.3 ml LV SV MOD A2C:     91.4 ml LV SV MOD A4C:     160.0 ml LV SV MOD BP:      87.6 ml RIGHT VENTRICLE RV S prime:     15.00 cm/s TAPSE (M-mode): 2.4 cm LEFT ATRIUM             Index       RIGHT ATRIUM           Index LA diam:        4.20 cm 2.36 cm/m  RA Area:     15.90 cm LA Vol (A2C):   72.6 ml 40.85 ml/m RA Volume:   44.50 ml  25.04 ml/m LA Vol (A4C):   69.7 ml 39.22 ml/m LA Biplane Vol: 77.0 ml 43.33 ml/m  AORTIC VALVE LVOT Vmax:   137.00 cm/s LVOT Vmean:  86.900 cm/s LVOT VTI:    0.238 m  AORTA Ao Root diam: 2.90 cm MITRAL VALVE                        TRICUSPID VALVE MV Area (PHT): 3.63 cm             TR Peak grad:   22.5 mmHg MV Decel Time: 209 msec             TR Vmax:        237.00 cm/s MR Peak grad: 98.4 mmHg MR Vmax:      496.00 cm/s           SHUNTS MV E velocity: 88.80 cm/s 103 cm/s  Systemic VTI:  0.24 m MV A velocity: 79.60 cm/s 70.3 cm/s Systemic Diam: 1.70 cm MV E/A ratio:  1.12       1.5 Fransico Him MD Electronically signed by Fransico Him MD Signature Date/Time: 07/18/2019/10:28:13 AM    Final         Scheduled Meds: . amLODipine  10 mg Oral Daily  . carvedilol  6.25 mg Oral BID WC  . cyanocobalamin  1,000 mcg Intramuscular Q1400  . furosemide  80 mg Intravenous BID  . heparin  5,000 Units Subcutaneous Q8H  . hydrALAZINE  25 mg Oral Q8H  . influenza vaccine adjuvanted  0.5 mL Intramuscular Tomorrow-1000  . ipratropium-albuterol  3 mL Nebulization BID  . methylPREDNISolone (SOLU-MEDROL) injection  40 mg Intravenous Q8H  . mometasone-formoterol  2 puff Inhalation BID  . nicotine  7 mg Transdermal Daily  . potassium chloride  10 mEq Oral BID   Continuous Infusions:        Aline August, MD [facility] 07/20/2019, 7:24 AM

## 2019-07-20 NOTE — Progress Notes (Signed)
[facility] Progress Note    Assessment/ Plan:   AKI on CKD IIIa with proteinuria vs progressive CKD, nonoliguric: Cr 5.27>5.83 today, last known baseline 1.4 in 2019. No signs of uremia.  Protein electrophoresis wnl. P/Cr ratio and serologies pending. Felt to be multifactorial, component of hemodynamically mediated acute injury with likely CKD progression due to longstanding uncontrolled hypertension and NSAIDs. Suspect intravascular depletion this am, will discontinue lasix today and restart 40mg  oral tomorrow, 2/12.    Uncontrolled hypertension: Longstanding, contributing to above. Slow normalization of BP with Norvasc, hydralazine, coreg, and lasix. Currently SBP 140-150's, will monitor for now.   Acute respiratory failure with hypoxia: Resolved. Likely 2/2 acute diastolic HF + COPD exacerbation. On RA. Receiving Lasix, steroids, and inhalers per cardiology and primary.  Macrocytic Anemia: Hgb 12.0. Iron sat 20. B12 222, receiving supplementation.     Tobacco abuse: 1/2 ppd, ~ 47 pack year history. Desires to quit. Recommend NRT.    Subjective:   No acute events overnight. Wants to go home. Breathing well.  Objective:   BP (!) 143/72 (BP Location: Right Arm)   Pulse 74   Temp 98.2 F (36.8 C) (Oral)   Resp 18   Ht 5\' 8"  (1.727 m)   Wt 64.3 kg   SpO2 96%   BMI 21.55 kg/m   Intake/Output Summary (Last 24 hours) at 07/20/2019 0830 Last data filed at 07/20/2019 [id number] Gross per 24 hour  Intake 960 ml  Output 1900 ml  Net -940 ml   Weight change: -0.825 kg  Physical Exam: General: Older gentleman in NAD, sitting up comfortably  HEENT: NCAT, MM dry  Cardiac: RRR  Lungs: Breathing comfortably on RA. Expiratory wheeze with good air exchange  Abdomen: soft, non-tender Msk: Moves all extremities spontaneously  Ext: Warm, dry, no edema to BLE    Imaging: ECHOCARDIOGRAM COMPLETE  Result Date: 07/18/2019    ECHOCARDIOGRAM REPORT   Patient Name:   Albert Harris  Date of Exam: 07/18/2019 Medical Rec #:  [id number]      Height:       68.0 in Accession #:    [id number]     Weight:       144.0 lb Date of Birth:  Dec 17, 1955     BSA:          1.78 m Patient Age:    64 years       BP:           177/88 mmHg Patient Gender: M              HR:           77 bpm. Exam Location:  Inpatient Procedure: 2D Echo, Cardiac Doppler and Color Doppler Indications:    Congestive Heart Failure 428.0  History:        Patient has prior history of Echocardiogram examinations, most                 recent 11/10/2017. [name] and COPD; Risk Factors:Hypertension and                 Current Smoker.  Sonographer:    Vickie Epley RDCS Referring Phys: [place]  1. Left ventricular ejection fraction, by estimation, is 60 to 65%. The left ventricle has normal function. The left ventrical has no regional wall motion abnormalities. There is mildly increased left ventricular hypertrophy. Left ventricular diastolic parameters are consistent with Grade II diastolic dysfunction (pseudonormalization). Elevated left ventricular end-diastolic  pressure.  2. Right ventricular systolic function is normal. The right ventricular size is normal. There is mildly elevated pulmonary artery systolic pressure. The estimated right ventricular systolic pressure is XX123456 mmHg.  3. Left atrial size was mild to moderately dilated.  4. Mild mitral valve regurgitation.  5. The aortic valve is normal in structure and function. Aortic valve regurgitation is not visualized. No aortic stenosis is present.  6. The inferior vena cava is dilated in size with <50% respiratory variability, suggesting right atrial pressure of 15 mmHg. FINDINGS  Left Ventricle: Left ventricular ejection fraction, by estimation, is 60 to 65%. The left ventricle has normal function. The left ventricle has no regional wall motion abnormalities. The left ventricular internal cavity size was normal in size. There is  mildly increased left ventricular  hypertrophy. Left ventricular diastolic parameters are consistent with Grade II diastolic dysfunction (pseudonormalization). Right Ventricle: The right ventricular size is normal. No increase in right ventricular wall thickness. Right ventricular systolic function is normal. There is mildly elevated pulmonary artery systolic pressure. The tricuspid regurgitant velocity is 2.37  m/s, and with an assumed right atrial pressure of 15 mmHg, the estimated right ventricular systolic pressure is [id number] mmHg. Left Atrium: Left atrial size was mild to moderately dilated. Right Atrium: Right atrial size was normal in size. Pericardium: There is no evidence of pericardial effusion. Mitral Valve: The mitral valve is normal in structure and function. Normal mobility of the mitral valve leaflets. Moderate mitral annular calcification. Mild mitral valve regurgitation. No evidence of mitral valve stenosis. Tricuspid Valve: The tricuspid valve is normal in structure. Tricuspid valve regurgitation is mild . No evidence of tricuspid stenosis. Aortic Valve: The aortic valve is normal in structure and function. Aortic valve regurgitation is not visualized. No aortic stenosis is present. Pulmonic Valve: The pulmonic valve was normal in structure. Pulmonic valve regurgitation is not visualized. No evidence of pulmonic stenosis. Aorta: The aortic root is normal in size and structure. Venous: The inferior vena cava is dilated in size with less than 50% respiratory variability, suggesting right atrial pressure of 15 mmHg. The inferior vena cava and the hepatic vein show a normal flow pattern. IAS/Shunts: No atrial level shunt detected by color flow Doppler.  LEFT VENTRICLE PLAX 2D LVIDd:         4.50 cm       Diastology LVIDs:         3.40 cm       LV e' lateral:   4.79 cm/s LV PW:         1.20 cm       LV E/e' lateral: 18.5 LV IVS:        1.20 cm       LV e' medial:    4.57 cm/s LVOT diam:     1.70 cm       LV E/e' medial:  19.4 LV SV:          54.02 ml LV SV Index:   25.43 LVOT Area:     2.27 cm  LV Volumes (MOD) LV area d, A2C:    43.40 cm LV area d, A4C:    42.20 cm LV area s, A2C:    26.40 cm LV area s, A4C:    25.90 cm LV major d, A2C:   9.41 cm LV major d, A4C:   9.15 cm LV major s, A2C:   7.76 cm LV major s, A4C:   7.39 cm LV vol d, MOD A2C:  167.0 ml LV vol d, MOD A4C: 160.0 ml LV vol s, MOD A2C: 75.6 ml LV vol s, MOD A4C: 77.3 ml LV SV MOD A2C:     91.4 ml LV SV MOD A4C:     160.0 ml LV SV MOD BP:      87.6 ml RIGHT VENTRICLE RV S prime:     15.00 cm/s TAPSE (M-mode): 2.4 cm LEFT ATRIUM             Index       RIGHT ATRIUM           Index LA diam:        4.20 cm 2.36 cm/m  RA Area:     15.90 cm LA Vol (A2C):   72.6 ml 40.85 ml/m RA Volume:   44.50 ml  25.04 ml/m LA Vol (A4C):   69.7 ml 39.22 ml/m LA Biplane Vol: 77.0 ml 43.33 ml/m  AORTIC VALVE LVOT Vmax:   137.00 cm/s LVOT Vmean:  86.900 cm/s LVOT VTI:    0.238 m  AORTA Ao Root diam: 2.90 cm MITRAL VALVE                        TRICUSPID VALVE MV Area (PHT): 3.63 cm             TR Peak grad:   22.5 mmHg MV Decel Time: 209 msec             TR Vmax:        237.00 cm/s MR Peak grad: 98.4 mmHg MR Vmax:      496.00 cm/s           SHUNTS MV E velocity: 88.80 cm/s 103 cm/s  Systemic VTI:  0.24 m MV A velocity: 79.60 cm/s 70.3 cm/s Systemic Diam: 1.70 cm MV E/A ratio:  1.12       1.5 Fransico Him MD Electronically signed by Fransico Him MD Signature Date/Time: 07/18/2019/10:28:13 AM    Final     Labs: BMET Recent Labs  Lab 07/17/19 1849 07/18/19 0529 07/19/19 0722 07/20/19 0435  NA 141 140 139 137  K 4.6 4.3 4.4 5.3*  CL 113* 110 110 105  CO2 16* 16* 16* 17*  GLUCOSE 102* 110* 104* 134*  BUN 45* 45* 54* 67*  CREATININE 5.38* 5.23* 5.27* 5.83*  CALCIUM 8.7* 8.9 8.9 8.9  PHOS  --   --   --  5.6*   CBC Recent Labs  Lab 07/17/19 1849 07/18/19 0529  WBC 9.1 11.4*  NEUTROABS  --  10.2*  HGB 11.8* 12.0*  HCT 37.7* 38.1*  MCV 100.3* 100.8*  PLT 181 182    Medications:      amLODipine  10 mg Oral Daily   carvedilol  6.25 mg Oral BID WC   cyanocobalamin  1,000 mcg Intramuscular Q1400   furosemide  80 mg Intravenous BID   heparin  5,000 Units Subcutaneous Q8H   hydrALAZINE  25 mg Oral Q8H   influenza vaccine adjuvanted  0.5 mL Intramuscular Tomorrow-1000   methylPREDNISolone (SOLU-MEDROL) injection  40 mg Intravenous Q8H   mometasone-formoterol  2 puff Inhalation BID   nicotine  7 mg Transdermal Daily   potassium chloride  10 mEq Oral BID      Darrelyn Hillock, DO  Family Medicine PGY-2  07/20/2019, 8:30 AM

## 2019-07-21 LAB — BASIC METABOLIC PANEL
Anion gap: 13 (ref 5–15)
BUN: 85 mg/dL — ABNORMAL HIGH (ref 8–23)
CO2: 19 mmol/L — ABNORMAL LOW (ref 22–32)
Calcium: 8.9 mg/dL (ref 8.9–10.3)
Chloride: 106 mmol/L (ref 98–111)
Creatinine, Ser: 5.89 mg/dL — ABNORMAL HIGH (ref 0.61–1.24)
GFR calc Af Amer: 11 mL/min — ABNORMAL LOW (ref >60)
GFR calc non Af Amer: 9 mL/min — ABNORMAL LOW (ref >60)
Glucose, Bld: 114 mg/dL — ABNORMAL HIGH (ref 70–99)
Potassium: 5 mmol/L (ref 3.5–5.1)
Sodium: 138 mmol/L (ref 135–145)

## 2019-07-21 LAB — CBC WITH DIFFERENTIAL/PLATELET
Abs Immature Granulocytes: 0.07 10*3/uL (ref 0.00–0.07)
Basophils Absolute: 0 10*3/uL (ref 0.0–0.1)
Basophils Relative: 0 %
Eosinophils Absolute: 0 10*3/uL (ref 0.0–0.5)
Eosinophils Relative: 0 %
HCT: 32.3 % — ABNORMAL LOW (ref 39.0–52.0)
Hemoglobin: 10.6 g/dL — ABNORMAL LOW (ref 13.0–17.0)
Immature Granulocytes: 1 %
Lymphocytes Relative: 5 %
Lymphs Abs: 0.7 10*3/uL (ref 0.7–4.0)
MCH: 31.5 pg (ref 26.0–34.0)
MCHC: 32.8 g/dL (ref 30.0–36.0)
MCV: 95.8 fL (ref 80.0–100.0)
Monocytes Absolute: 0.2 10*3/uL (ref 0.1–1.0)
Monocytes Relative: 2 %
Neutro Abs: 12.3 10*3/uL — ABNORMAL HIGH (ref 1.7–7.7)
Neutrophils Relative %: 92 %
Platelets: 155 10*3/uL (ref 150–400)
RBC: 3.37 MIL/uL — ABNORMAL LOW (ref 4.22–5.81)
RDW: 14.4 % (ref 11.5–15.5)
WBC: 13.3 10*3/uL — ABNORMAL HIGH (ref 4.0–10.5)
nRBC: 0 % (ref 0.0–0.2)

## 2019-07-21 LAB — MAGNESIUM: Magnesium: 2.6 mg/dL — ABNORMAL HIGH (ref 1.7–2.4)

## 2019-07-21 MED ORDER — PREDNISONE 20 MG PO TABS
40.0000 mg | ORAL_TABLET | Freq: Every day | ORAL | Status: DC
  Administered 2019-07-22 – 2019-07-23 (×2): 40 mg via ORAL
  Filled 2019-07-21 (×2): qty 2

## 2019-07-21 NOTE — Progress Notes (Signed)
  [facility] Progress Note    Assessment/ Plan:    AKI on CKD IIIa with proteinuria vs progressive CKD, nonoliguric: Cr 5.27>5.83 >5.89 today, last known baseline 1.4 in 2019. No signs of uremia. Light chain ratio 1.83, slightly elevated. P/Cr ratio and serologies pending. Felt to be multifactorial, component of hemodynamically mediated acute injury with CKD progression due to longstanding uncontrolled HTN/NSAIDs.  Euvolemic, will discontinue Lasix.  If creatinine remains stable or decreases by tomorrow am, 2/13, will be cleared from a renal perspective for discharge with close renal outpatient follow-up scheduled.   Uncontrolled hypertension: Longstanding, contributing to above. SBP 140-160's. Cont Norvasc, hydralazine, and coreg to allow slow normalization over time.    Acute respiratory failure with hypoxia: Resolved. Likely 2/2 acute diastolic HF + COPD exacerbation. On RA. Received Lasix, steroids, and inhalers per cardiology and primary.   Macrocytic Anemia: Hgb 12.0>10.6. Iron sat 20. B12 222, receiving supplementation. Monitor.    Tobacco abuse: 1/2 ppd, ~ 47 pack year history. Desires to quit. Recommend NRT.    Subjective:   No acute events overnight.  Doing well this morning.  Denies any shortness of breath or swelling. Objective:   BP (!) 168/89 (BP Location: Right Arm)   Pulse 77   Temp 98.2 F (36.8 C) (Oral)   Resp 18   Ht 5\' 8"  (1.727 m)   Wt 63.9 kg   SpO2 98%   BMI 21.41 kg/m   Intake/Output Summary (Last 24 hours) at 07/21/2019 0820 Last data filed at 07/20/2019 1700 Gross per 24 hour  Intake 600 ml  Output 200 ml  Net 400 ml   Weight change: -0.408 kg  Physical Exam: General: Older gentleman in NAD, sitting up comfortably  HEENT: NCAT, MM dry  Cardiac: RRR Lungs: Breathing comfortably on room air.  Inspiratory/expiratory wheeze with good air exchange, no crackles. Abdomen: soft, non-tender Msk: Moves all extremities spontaneously   Ext: Warm, dry, no edema to BLE    Imaging: No results found.  Labs: BMET Recent Labs  Lab 07/17/19 1849 07/18/19 0529 07/19/19 0722 07/20/19 0435 07/21/19 0447  NA 141 140 139 137 138  K 4.6 4.3 4.4 5.3* 5.0  CL 113* 110 110 105 106  CO2 16* 16* 16* 17* 19*  GLUCOSE 102* 110* 104* 134* 114*  BUN 45* 45* 54* 67* 85*  CREATININE 5.38* 5.23* 5.27* 5.83* 5.89*  CALCIUM 8.7* 8.9 8.9 8.9 8.9  PHOS  --   --   --  5.6*  --    CBC Recent Labs  Lab 07/17/19 1849 07/18/19 0529 07/21/19 0447  WBC 9.1 11.4* 13.3*  NEUTROABS  --  10.2* 12.3*  HGB 11.8* 12.0* 10.6*  HCT 37.7* 38.1* 32.3*  MCV 100.3* 100.8* 95.8  PLT 181 182 155    Medications:    . amLODipine  10 mg Oral Daily  . carvedilol  6.25 mg Oral BID WC  . cyanocobalamin  1,000 mcg Intramuscular Q1400  . heparin  5,000 Units Subcutaneous Q8H  . hydrALAZINE  25 mg Oral Q8H  . influenza vaccine adjuvanted  0.5 mL Intramuscular Tomorrow-1000  . mometasone-formoterol  2 puff Inhalation BID  . nicotine  7 mg Transdermal Daily  . [START ON 07/22/2019] predniSONE  40 mg Oral Q breakfast      Darrelyn Hillock, DO  Family Medicine PGY-2  07/21/2019, 8:20 AM

## 2019-07-21 NOTE — Plan of Care (Signed)
  Problem: Education: Goal: Knowledge of General Education information will improve Description: Including pain rating scale, medication(s)/side effects and non-pharmacologic comfort measures Outcome: Progressing   Problem: Education: Goal: Knowledge of disease or condition will improve Outcome: Progressing Goal: Knowledge of the prescribed therapeutic regimen will improve Outcome: Progressing Goal: Individualized Educational Video(s) Outcome: Progressing   Problem: Activity: Goal: Ability to tolerate increased activity will improve Outcome: Progressing Goal: Will verbalize the importance of balancing activity with adequate rest periods Outcome: Progressing   Problem: Respiratory: Goal: Ability to maintain a clear airway will improve Outcome: Progressing Goal: Levels of oxygenation will improve Outcome: Progressing Goal: Ability to maintain adequate ventilation will improve Outcome: Progressing   Problem: Education: Goal: Ability to demonstrate management of disease process will improve Outcome: Progressing Goal: Ability to verbalize understanding of medication therapies will improve Outcome: Progressing Goal: Individualized Educational Video(s) Outcome: Progressing   Problem: Activity: Goal: Capacity to carry out activities will improve Outcome: Progressing   Problem: Cardiac: Goal: Ability to achieve and maintain adequate cardiopulmonary perfusion will improve Outcome: Progressing   Problem: Education: Goal: Ability to demonstrate management of disease process will improve Outcome: Progressing Goal: Ability to verbalize understanding of medication therapies will improve Outcome: Progressing Goal: Individualized Educational Video(s) Outcome: Progressing   Problem: Activity: Goal: Capacity to carry out activities will improve Outcome: Progressing   Problem: Cardiac: Goal: Ability to achieve and maintain adequate cardiopulmonary perfusion will improve Outcome:  Progressing

## 2019-07-21 NOTE — Discharge Instructions (Signed)

## 2019-07-21 NOTE — Plan of Care (Signed)
Nutrition Education Note  RD consulted for nutrition education regarding new onset CHF.   Spoke with pt at bedside, who is pleasant and in good spirits. He shares that he always has a good appetite and consumes 2-3 meals per day- Breakfast: grits, eggs, and sausage; Lunch: potato and grilled vienna sausage; Dinner: fast food OR meat, starch, and vegetables. Per pt, with does the cooking at home and uses a a lot of canned vegetables. She does use some salt to season food, but pt reports she has always been using salt substitute to reduce sodium in some dishes.   Pt very forth coming about fast food intake; focus on conversation was on how he could choose lower sodium options at restaurants.   He has a scale at home and is willing to weight daily. He has gone through the "Living Well with Heart Failure" booklet and was able to teachback basic principles to this RD.  Case discussed with RN after visit, to inform that education was completed.  RD provided "Low Sodium Nutrition Therapy" handout from the Academy of [employer] and Dietetics. Reviewed patient's dietary recall. Provided examples on ways to decrease sodium intake in diet. Discouraged intake of processed foods and use of salt shaker. Encouraged fresh fruits and vegetables as well as whole grain sources of carbohydrates to maximize fiber intake.   RD discussed why it is important for patient to adhere to diet recommendations, and emphasized the role of fluids, foods to avoid, and importance of weighing self daily. Teach back method used.  Expect fair to good compliance.  Current diet order is heart healthy with 1.5 L fluid restriction, patient is consuming approximately 100% of meals at this time. Labs and medications reviewed. No further nutrition interventions warranted at this time. RD contact information provided. If additional nutrition issues arise, please re-consult RD.   Loistine Chance, RD, LDN, [place] Registered Dietitian II Certified  Diabetes Care and Education Specialist Please refer to [facility] for RD and/or RD on-call/weekend/after hours pager

## 2019-07-21 NOTE — Progress Notes (Signed)
[name], male   DOB: 10/05/1955, 64 y.o.   MRN: [id number]  PROGRESS NOTE    Albert Harris  [id number] DOB: 1955-12-21 DOA: 07/17/2019 PCP: Patient, No Pcp Per   Brief Narrative:  64 year old male with history of hypertension, COPD, ongoing tobacco abuse, chronic kidney disease stage III with creatinine around 1.3 in July 2019 presented with worsening shortness of breath.  In the ED, his blood pressure was 240/120 with chest x-ray showing trace bilateral pleural effusion along with elevated JVD and wheezing.  He was given intravenous Lasix.  Creatinine was found to be 5.38.  BNP was 2800.  Covid test was negative.  Cardiology was consulted.  Nephrology was subsequently consulted.  Assessment & Plan:   Acute diastolic heart failure -Presented with worsening shortness of breath with BNP of 2800. -Cardiology signed off on 07/20/2019: Outpatient cardiology follow-up.  Continue oral Coreg and hydralazine.  Strict input and output.  Daily weights.  Fluid restriction.  Echo showed EF of 60 to 65% with grade 2 diastolic dysfunction. -IV Lasix was held on 07/20/2019 by nephrology.  Patient will be getting more Lasix from today onwards as per nephrology. -Monitor creatinine.  Probable COPD exacerbation -Continue Dulera and DuoNebs. -Currently on IV Solu-Medrol.  We will switch to oral prednisone 40 mg daily.  Will need outpatient pulmonary evaluation.  Acute kidney injury on chronic kidney disease stage III Acute metabolic acidosis -Last creatinine 1.3 in July 2019 in our system. -Presented with creatinine of 5.38.  Currently making urine.  Renal ultrasound was negative for hydronephrosis.  Nephrology following. -Creatinine 5.89 today.  Monitor  Hypertensive urgency -Blood pressure still high but improving.  Continue Lasix, Coreg, hydralazine and amlodipine.  Chest pain -From uncontrolled blood pressure and CHF.  Much improved. -Troponins did not significantly trend up.   Follow cardiology recommendations  Microcytic anemia -Probably from vitamin B12 deficiency.  Probable vitamin B12 deficiency -Vitamin B12 level is 222 which is at the lower end of normal.  Will continue vitamin B12 supplementation while inpatient and discharged on oral supplement.  Tobacco abuse -Counseled regarding cessation   DVT prophylaxis: Heparin Code Status: Full Family Communication: Spoke to patient at bedside Disposition Plan: Creatinine still elevated.  Will wait for nephrology recommendations regarding discharge planning.  Probable discharge home tomorrow if cleared by nephrology.  Consultants: Cardiology/nephrology  Procedures:  Echo  1. Left ventricular ejection fraction, by estimation, is 60 to 65%. The  left ventricle has normal function. The left ventrical has no regional  wall motion abnormalities. There is mildly increased left ventricular  hypertrophy. Left ventricular diastolic  parameters are consistent with Grade II diastolic dysfunction  (pseudonormalization). Elevated left ventricular end-diastolic pressure.  2. Right ventricular systolic function is normal. The right ventricular  size is normal. There is mildly elevated pulmonary artery systolic  pressure. The estimated right ventricular systolic pressure is [id number] mmHg.  3. Left atrial size was mild to moderately dilated.  4. Mild mitral valve regurgitation.  5. The aortic valve is normal in structure and function. Aortic valve  regurgitation is not visualized. No aortic stenosis is present.  6. The inferior vena cava is dilated in size with <50% respiratory  variability, suggesting right atrial pressure of 15 mmHg.   Antimicrobials:  None  Subjective: Patient seen and examined at bedside.  Denies worsening shortness of breath, fever, nausea or vomiting.  Still slightly short of breath with exertion. Objective: Vitals:   07/20/19 1825 07/20/19 1942 07/20/19 2001 07/21/19 0355  BP: 140/64   (!) 144/75 (!) 140/95  Pulse:   79 78  Resp:   20 20  Temp:   97.8 F (36.6 C) 97.6 F (36.4 C)  TempSrc:   Oral Oral  SpO2:  99% 98% 98%  Weight:    63.9 kg  Height:        Intake/Output Summary (Last 24 hours) at 07/21/2019 0735 Last data filed at 07/20/2019 1700 Gross per 24 hour  Intake 600 ml  Output 200 ml  Net 400 ml   Filed Weights   07/19/19 0437 07/20/19 0646 07/21/19 0355  Weight: 65.1 kg 64.3 kg 63.9 kg    Examination:  General exam: No distress.  Looks older than stated age.  Respiratory system: Bilateral decreased breath sounds at bases with bibasilar crackles  cardiovascular system: S1-S2 heard, rate controlled Gastrointestinal system: Abdomen is nondistended, soft and nontender.  Normal bowel sounds heard  extremities: No cyanosis.  Trace bilateral lower extremity edema Central nervous system: Alert and awake.  No focal neurological deficits. Moving extremities Skin: No lesions or rashes Psychiatry: Flat affect    Data Reviewed: I have personally reviewed following labs and imaging studies  CBC: Recent Labs  Lab 07/17/19 1849 07/18/19 0529 07/21/19 0447  WBC 9.1 11.4* 13.3*  NEUTROABS  --  10.2* 12.3*  HGB 11.8* 12.0* 10.6*  HCT 37.7* 38.1* 32.3*  MCV 100.3* 100.8* 95.8  PLT 181 182 [id number]   Basic Metabolic Panel: Recent Labs  Lab 07/17/19 1849 07/18/19 0529 07/19/19 0722 07/20/19 0435 07/21/19 0447  NA 141 140 139 137 138  K 4.6 4.3 4.4 5.3* 5.0  CL 113* 110 110 105 106  CO2 16* 16* 16* 17* 19*  GLUCOSE 102* 110* 104* 134* 114*  BUN 45* 45* 54* 67* 85*  CREATININE 5.38* 5.23* 5.27* 5.83* 5.89*  CALCIUM 8.7* 8.9 8.9 8.9 8.9  MG  --  2.4  --  2.3 2.6*  PHOS  --   --   --  5.6*  --    GFR: Estimated Creatinine Clearance: 11.3 mL/min (A) (by C-G formula based on SCr of 5.89 mg/dL (H)). Liver Function Tests: Recent Labs  Lab 07/18/19 0529 07/20/19 0435  AST 19  --   ALT 19  --   ALKPHOS 70  --   BILITOT 0.9  --   PROT 6.2*  --     ALBUMIN 2.9* 2.8*   No results for input(s): LIPASE, AMYLASE in the last 168 hours. No results for input(s): AMMONIA in the last 168 hours. Coagulation Profile: No results for input(s): INR, PROTIME in the last 168 hours. Cardiac Enzymes: No results for input(s): CKTOTAL, CKMB, CKMBINDEX, TROPONINI in the last 168 hours. BNP (last 3 results) No results for input(s): PROBNP in the last 8760 hours. HbA1C: No results for input(s): HGBA1C in the last 72 hours. CBG: No results for input(s): GLUCAP in the last 168 hours. Lipid Profile: No results for input(s): CHOL, HDL, LDLCALC, TRIG, CHOLHDL, LDLDIRECT in the last 72 hours. Thyroid Function Tests: No results for input(s): TSH, T4TOTAL, FREET4, T3FREE, THYROIDAB in the last 72 hours. Anemia Panel: No results for input(s): VITAMINB12, FOLATE, FERRITIN, TIBC, IRON, RETICCTPCT in the last 72 hours. Sepsis Labs: No results for input(s): PROCALCITON, LATICACIDVEN in the last 168 hours.  Recent Results (from the past 240 hour(s))  Respiratory Panel by RT PCR (Flu A&B, Covid) - Nasopharyngeal Swab     Status: None   Collection Time: 07/18/19  2:06 AM  Specimen: Nasopharyngeal Swab  Result Value Ref Range Status   SARS Coronavirus 2 by RT PCR NEGATIVE NEGATIVE Final    Comment: (NOTE) SARS-CoV-2 target nucleic acids are NOT DETECTED. The SARS-CoV-2 RNA is generally detectable in upper respiratoy specimens during the acute phase of infection. The lowest concentration of SARS-CoV-2 viral copies this assay can detect is 131 copies/mL. A negative result does not preclude SARS-Cov-2 infection and should not be used as the sole basis for treatment or other patient management decisions. A negative result may occur with  improper specimen collection/handling, submission of specimen other than nasopharyngeal swab, presence of viral mutation(s) within the areas targeted by this assay, and inadequate number of viral copies (<131 copies/mL). A  negative result must be combined with clinical observations, patient history, and epidemiological information. The expected result is Negative. Fact Sheet for Patients:  PinkCheek.be Fact Sheet for Healthcare Providers:  GravelBags.it This test is not yet ap proved or cleared by the [place] FDA and  has been authorized for detection and/or diagnosis of SARS-CoV-2 by FDA under an Emergency Use Authorization (EUA). This EUA will remain  in effect (meaning this test can be used) for the duration of the COVID-19 declaration under Section 564(b)(1) of the Act, 21 U.S.C. section 360bbb-3(b)(1), unless the authorization is terminated or revoked sooner.    Influenza A by PCR NEGATIVE NEGATIVE Final   Influenza B by PCR NEGATIVE NEGATIVE Final    Comment: (NOTE) The Xpert Xpress SARS-CoV-2/FLU/RSV assay is intended as an aid in  the diagnosis of influenza from Nasopharyngeal swab specimens and  should not be used as a sole basis for treatment. Nasal washings and  aspirates are unacceptable for Xpert Xpress SARS-CoV-2/FLU/RSV  testing. Fact Sheet for Patients: PinkCheek.be Fact Sheet for Healthcare Providers: GravelBags.it This test is not yet approved or cleared by the [place] FDA and  has been authorized for detection and/or diagnosis of SARS-CoV-2 by  FDA under an Emergency Use Authorization (EUA). This EUA will remain  in effect (meaning this test can be used) for the duration of the  Covid-19 declaration under Section 564(b)(1) of the Act, 21  U.S.C. section 360bbb-3(b)(1), unless the authorization is  terminated or revoked. Performed at [facility], [place] [address], [place], [place] [address]          Radiology Studies: No results found.      Scheduled Meds: . amLODipine  10 mg Oral Daily  . carvedilol  6.25 mg Oral BID WC  .  cyanocobalamin  1,000 mcg Intramuscular Q1400  . furosemide  40 mg Oral Daily  . heparin  5,000 Units Subcutaneous Q8H  . hydrALAZINE  25 mg Oral Q8H  . influenza vaccine adjuvanted  0.5 mL Intramuscular Tomorrow-1000  . methylPREDNISolone (SOLU-MEDROL) injection  40 mg Intravenous Q12H  . mometasone-formoterol  2 puff Inhalation BID  . nicotine  7 mg Transdermal Daily   Continuous Infusions:        Aline August, MD [facility] 07/21/2019, 7:35 AM

## 2019-07-22 LAB — BASIC METABOLIC PANEL
Anion gap: 13 (ref 5–15)
Anion gap: 13 (ref 5–15)
BUN: 100 mg/dL — ABNORMAL HIGH (ref 8–23)
BUN: 106 mg/dL — ABNORMAL HIGH (ref 8–23)
CO2: 17 mmol/L — ABNORMAL LOW (ref 22–32)
CO2: 17 mmol/L — ABNORMAL LOW (ref 22–32)
Calcium: 8.5 mg/dL — ABNORMAL LOW (ref 8.9–10.3)
Calcium: 8.3 mg/dL — ABNORMAL LOW (ref 8.9–10.3)
Chloride: 108 mmol/L (ref 98–111)
Chloride: 107 mmol/L (ref 98–111)
Creatinine, Ser: 6.18 mg/dL — ABNORMAL HIGH (ref 0.61–1.24)
Creatinine, Ser: 6.44 mg/dL — ABNORMAL HIGH (ref 0.61–1.24)
GFR calc Af Amer: 10 mL/min — ABNORMAL LOW (ref >60)
GFR calc Af Amer: 10 mL/min — ABNORMAL LOW (ref >60)
GFR calc non Af Amer: 9 mL/min — ABNORMAL LOW (ref >60)
GFR calc non Af Amer: 8 mL/min — ABNORMAL LOW (ref >60)
Glucose, Bld: 89 mg/dL (ref 70–99)
Glucose, Bld: 179 mg/dL — ABNORMAL HIGH (ref 70–99)
Potassium: 4.1 mmol/L (ref 3.5–5.1)
Potassium: 4.4 mmol/L (ref 3.5–5.1)
Sodium: 138 mmol/L (ref 135–145)
Sodium: 137 mmol/L (ref 135–145)

## 2019-07-22 LAB — MAGNESIUM: Magnesium: 2.5 mg/dL — ABNORMAL HIGH (ref 1.7–2.4)

## 2019-07-22 NOTE — Progress Notes (Signed)
[name], male   Harris: April 16, 1956, 64 y.o.   MRN: [id number]  PROGRESS NOTE    Albert Harris: 1955-09-03 DOA: 07/17/2019 PCP: Albert Harris   Brief Narrative:  64 year old male with history of hypertension, COPD, ongoing tobacco abuse, chronic kidney disease stage III with creatinine around 1.3 in July 2019 presented with worsening shortness of breath.  In the ED, his blood pressure was 240/120 with chest x-ray showing trace bilateral pleural effusion along with elevated JVD and wheezing.  He was given intravenous Lasix.  Creatinine was found to be 5.38.  BNP was 2800.  Covid test was negative.  Cardiology was consulted.  Nephrology was subsequently consulted.  Assessment & Plan:   Acute diastolic heart failure -Presented with worsening shortness of breath with BNP of 2800. -Cardiology signed off on 07/20/2019: Outpatient cardiology follow-up.  Continue oral Coreg and hydralazine.  Strict input and output.  Daily weights.  Fluid restriction.  Echo showed EF of 60 to 65% with grade 2 diastolic dysfunction. -Creatinine trending up.  Lasix will be held today.  Probable COPD exacerbation -Continue Dulera and DuoNebs. -Off Solu-Medrol.  Continue oral prednisone 40 mg daily for total of 7 days.  Will need outpatient pulmonary evaluation.  Acute kidney injury on chronic kidney disease stage III Acute metabolic acidosis -Last creatinine 1.3 in July 2019 in our system. -Presented with creatinine of 5.38.  Currently making urine.  Renal ultrasound was negative for hydronephrosis.  Nephrology following. -Creatinine 6.18 today.  Monitor  Hypertensive urgency -Blood pressure still high but improving.  Continue Coreg, hydralazine and amlodipine.  Chest pain -From uncontrolled blood pressure and CHF.  Much improved. -Troponins did not significantly trend up.  Follow cardiology recommendations  Microcytic anemia -Probably from vitamin B12  deficiency.  Probable vitamin B12 deficiency -Vitamin B12 level is 222 which is at the lower end of normal.  Will continue vitamin B12 supplementation while inpatient and discharged on oral supplement.  Tobacco abuse -Counseled regarding cessation   DVT prophylaxis: Heparin Code Status: Full Family Communication: Spoke to patient at bedside Disposition Plan: Creatinine trending upwards.  Will wait for nephrology recommendations regarding discharge planning.  Hopefully discharge home once cleared by nephrology in the next 24 to 48 hours.  Consultants: Cardiology/nephrology  Procedures:  Echo  1. Left ventricular ejection fraction, by estimation, is 60 to 65%. The  left ventricle has normal function. The left ventrical has no regional  wall motion abnormalities. There is mildly increased left ventricular  hypertrophy. Left ventricular diastolic  parameters are consistent with Grade II diastolic dysfunction  (pseudonormalization). Elevated left ventricular end-diastolic pressure.  2. Right ventricular systolic function is normal. The right ventricular  size is normal. There is mildly elevated pulmonary artery systolic  pressure. The estimated right ventricular systolic pressure is [id number] mmHg.  3. Left atrial size was mild to moderately dilated.  4. Mild mitral valve regurgitation.  5. The aortic valve is normal in structure and function. Aortic valve  regurgitation is not visualized. No aortic stenosis is present.  6. The inferior vena cava is dilated in size with <50% respiratory  variability, suggesting right atrial pressure of 15 mmHg.   Antimicrobials:  None  Subjective: Patient seen and examined at bedside.  Denies chest pain, worsening shortness of breath, fever or vomiting. Objective: Vitals:   07/21/19 1956 07/21/19 2014 07/22/19 0431 07/22/19 0816  BP:  (!) 145/64 (!) 156/80 (!) 165/70  Pulse:  72 65 72  Resp:  20 20 20   Temp:  97.7 F (36.5 C) 98.1 F (36.7  C)   TempSrc:  Oral Oral   SpO2: 98% 99% 97% 99%  Weight:   64.2 kg   Height:        Intake/Output Summary (Last 24 hours) at 07/22/2019 0856 Last data filed at 07/22/2019 0436 Gross Harris 24 hour  Intake 960 ml  Output 1300 ml  Net -340 ml   Filed Weights   07/20/19 0646 07/21/19 0355 07/22/19 0431  Weight: 64.3 kg 63.9 kg 64.2 kg    Examination:  General exam: No acute distress.  Looks older than stated age.  Respiratory system: Bilateral decreased breath sounds at bases with basilar crackles.  No wheezing  cardiovascular system: Rate controlled, S1-S2 heard Gastrointestinal system: Abdomen is nondistended, soft and nontender.  Normal bowel sounds heard  extremities: No clubbing or cyanosis.  Trace bilateral lower extremity edema     Data Reviewed: I have personally reviewed following labs and imaging studies  CBC: Recent Labs  Lab 07/17/19 1849 07/18/19 0529 07/21/19 0447  WBC 9.1 11.4* 13.3*  NEUTROABS  --  10.2* 12.3*  HGB 11.8* 12.0* 10.6*  HCT 37.7* 38.1* 32.3*  MCV 100.3* 100.8* 95.8  PLT 181 182 [id number]   Basic Metabolic Panel: Recent Labs  Lab 07/18/19 0529 07/19/19 0722 07/20/19 0435 07/21/19 0447 07/22/19 0620  NA 140 139 137 138 138  K 4.3 4.4 5.3* 5.0 4.1  CL 110 110 105 106 108  CO2 16* 16* 17* 19* 17*  GLUCOSE 110* 104* 134* 114* 89  BUN 45* 54* 67* 85* 100*  CREATININE 5.23* 5.27* 5.83* 5.89* 6.18*  CALCIUM 8.9 8.9 8.9 8.9 8.5*  MG 2.4  --  2.3 2.6* 2.5*  PHOS  --   --  5.6*  --   --    GFR: Estimated Creatinine Clearance: 10.8 mL/min (A) (by C-G formula based on SCr of 6.18 mg/dL (H)). Liver Function Tests: Recent Labs  Lab 07/18/19 0529 07/20/19 0435  AST 19  --   ALT 19  --   ALKPHOS 70  --   BILITOT 0.9  --   PROT 6.2*  --   ALBUMIN 2.9* 2.8*   No results for input(s): LIPASE, AMYLASE in the last 168 hours. No results for input(s): AMMONIA in the last 168 hours. Coagulation Profile: No results for input(s): INR, PROTIME in  the last 168 hours. Cardiac Enzymes: No results for input(s): CKTOTAL, CKMB, CKMBINDEX, TROPONINI in the last 168 hours. BNP (last 3 results) No results for input(s): PROBNP in the last 8760 hours. HbA1C: No results for input(s): HGBA1C in the last 72 hours. CBG: No results for input(s): GLUCAP in the last 168 hours. Lipid Profile: No results for input(s): CHOL, HDL, LDLCALC, TRIG, CHOLHDL, LDLDIRECT in the last 72 hours. Thyroid Function Tests: No results for input(s): TSH, T4TOTAL, FREET4, T3FREE, THYROIDAB in the last 72 hours. Anemia Panel: No results for input(s): VITAMINB12, FOLATE, FERRITIN, TIBC, IRON, RETICCTPCT in the last 72 hours. Sepsis Labs: No results for input(s): PROCALCITON, LATICACIDVEN in the last 168 hours.  Recent Results (from the past 240 hour(s))  Respiratory Panel by RT PCR (Flu A&B, Covid) - Nasopharyngeal Swab     Status: None   Collection Time: 07/18/19  2:06 AM   Specimen: Nasopharyngeal Swab  Result Value Ref Range Status   SARS Coronavirus 2 by RT PCR NEGATIVE NEGATIVE Final    Comment: (NOTE) SARS-CoV-2 target nucleic acids are NOT  DETECTED. The SARS-CoV-2 RNA is generally detectable in upper respiratoy specimens during the acute phase of infection. The lowest concentration of SARS-CoV-2 viral copies this assay can detect is 131 copies/mL. A negative result does not preclude SARS-Cov-2 infection and should not be used as the sole basis for treatment or other patient management decisions. A negative result may occur with  improper specimen collection/handling, submission of specimen other than nasopharyngeal swab, presence of viral mutation(s) within the areas targeted by this assay, and inadequate number of viral copies (<131 copies/mL). A negative result must be combined with clinical observations, patient history, and epidemiological information. The expected result is Negative. Fact Sheet for Patients:   PinkCheek.be Fact Sheet for Healthcare Providers:  GravelBags.it This test is not yet ap proved or cleared by the [place] FDA and  has been authorized for detection and/or diagnosis of SARS-CoV-2 by FDA under an Emergency Use Authorization (EUA). This EUA will remain  in effect (meaning this test can be used) for the duration of the COVID-19 declaration under Section 564(b)(1) of the Act, 21 U.S.C. section 360bbb-3(b)(1), unless the authorization is terminated or revoked sooner.    Influenza A by PCR NEGATIVE NEGATIVE Final   Influenza B by PCR NEGATIVE NEGATIVE Final    Comment: (NOTE) The Xpert Xpress SARS-CoV-2/FLU/RSV assay is intended as an aid in  the diagnosis of influenza from Nasopharyngeal swab specimens and  should not be used as a sole basis for treatment. Nasal washings and  aspirates are unacceptable for Xpert Xpress SARS-CoV-2/FLU/RSV  testing. Fact Sheet for Patients: PinkCheek.be Fact Sheet for Healthcare Providers: GravelBags.it This test is not yet approved or cleared by the [place] FDA and  has been authorized for detection and/or diagnosis of SARS-CoV-2 by  FDA under an Emergency Use Authorization (EUA). This EUA will remain  in effect (meaning this test can be used) for the duration of the  Covid-19 declaration under Section 564(b)(1) of the Act, 21  U.S.C. section 360bbb-3(b)(1), unless the authorization is  terminated or revoked. Performed at [facility], [place] [address], [place], [place] [address]          Radiology Studies: No results found.      Scheduled Meds: . amLODipine  10 mg Oral Daily  . carvedilol  6.25 mg Oral BID WC  . cyanocobalamin  1,000 mcg Intramuscular Q1400  . heparin  5,000 Units Subcutaneous Q8H  . hydrALAZINE  25 mg Oral Q8H  . influenza vaccine adjuvanted  0.5 mL Intramuscular  Tomorrow-1000  . mometasone-formoterol  2 puff Inhalation BID  . nicotine  7 mg Transdermal Daily  . predniSONE  40 mg Oral Q breakfast   Continuous Infusions:        Aline August, MD [facility] 07/22/2019, 8:56 AM

## 2019-07-22 NOTE — Progress Notes (Signed)
  [facility] Progress Note    Assessment/ Plan:    AKI on CKD IIIa with proteinuria vs progressive CKD, nonoliguric: Unfortunately slowly rising, Cr 5.27>5.83 >5.89>6.18 today, last known baseline 1.4 in 2019.  Euvolemic. Serology work-up unremarkable.  More suspect this is progressive CKD due to longstanding uncontrolled hypertension/NSAID use.  Will recheck Cr this afternoon, if stable or improved will be clear from a renal perspective to discharge home.  Already has follow-up scheduled for 2 weeks.   Uncontrolled hypertension: Longstanding, contributing to above. SBP 140-160's. Cont Norvasc, hydralazine, and coreg to allow slow normalization over time.    Acute respiratory failure with hypoxia: Resolved. 2/2 acute diastolic HF + COPD exacerbation. Per cardiology and primary.   Macrocytic Anemia: Hgb 12.0>10.6. Iron sat 20. B12 222, receiving supplementation. Monitor.    Tobacco abuse: 1/2 ppd, ~ 47 pack year history. Desires to quit. Recommend NRT.    Subjective:   No acute events overnight.  Feeling great, breathing well. Objective:   BP (!) 156/80 (BP Location: Right Arm)   Pulse 65   Temp 98.1 F (36.7 C) (Oral)   Resp 20   Ht 5\' 8"  (1.727 m)   Wt 64.2 kg   SpO2 97%   BMI 21.53 kg/m   Intake/Output Summary (Last 24 hours) at 07/22/2019 0749 Last data filed at 07/22/2019 0436 Gross per 24 hour  Intake 960 ml  Output 1300 ml  Net -340 ml   Weight change: 0.363 kg  Physical Exam: General: Older gentleman in no acute distress, sitting up comfortably HEENT: NCAT Cardiac: Regular rate and rhythm Lungs: breathing comfortably on room air.  Mild expiratory wheeze with good air exchange, no crackles Extremities: Warm, dry, no edema to BLE   Imaging: No results found.  Labs: BMET Recent Labs  Lab 07/17/19 1849 07/18/19 0529 07/19/19 0722 07/20/19 0435 07/21/19 0447 07/22/19 0620  NA 141 140 139 137 138 138  K 4.6 4.3 4.4 5.3* 5.0 4.1  CL  113* 110 110 105 106 108  CO2 16* 16* 16* 17* 19* 17*  GLUCOSE 102* 110* 104* 134* 114* 89  BUN 45* 45* 54* 67* 85* 100*  CREATININE 5.38* 5.23* 5.27* 5.83* 5.89* 6.18*  CALCIUM 8.7* 8.9 8.9 8.9 8.9 8.5*  PHOS  --   --   --  5.6*  --   --    CBC Recent Labs  Lab 07/17/19 1849 07/18/19 0529 07/21/19 0447  WBC 9.1 11.4* 13.3*  NEUTROABS  --  10.2* 12.3*  HGB 11.8* 12.0* 10.6*  HCT 37.7* 38.1* 32.3*  MCV 100.3* 100.8* 95.8  PLT 181 182 155    Medications:    . amLODipine  10 mg Oral Daily  . carvedilol  6.25 mg Oral BID WC  . cyanocobalamin  1,000 mcg Intramuscular Q1400  . heparin  5,000 Units Subcutaneous Q8H  . hydrALAZINE  25 mg Oral Q8H  . influenza vaccine adjuvanted  0.5 mL Intramuscular Tomorrow-1000  . mometasone-formoterol  2 puff Inhalation BID  . nicotine  7 mg Transdermal Daily  . predniSONE  40 mg Oral Q breakfast      Darrelyn Hillock, DO  Family Medicine PGY-2  07/22/2019, 7:49 AM

## 2019-07-23 LAB — BASIC METABOLIC PANEL
Anion gap: 12 (ref 5–15)
BUN: 100 mg/dL — ABNORMAL HIGH (ref 8–23)
CO2: 17 mmol/L — ABNORMAL LOW (ref 22–32)
Calcium: 8.5 mg/dL — ABNORMAL LOW (ref 8.9–10.3)
Chloride: 111 mmol/L (ref 98–111)
Creatinine, Ser: 5.7 mg/dL — ABNORMAL HIGH (ref 0.61–1.24)
GFR calc Af Amer: 11 mL/min — ABNORMAL LOW (ref >60)
GFR calc non Af Amer: 10 mL/min — ABNORMAL LOW (ref >60)
Glucose, Bld: 83 mg/dL (ref 70–99)
Potassium: 4.2 mmol/L (ref 3.5–5.1)
Sodium: 140 mmol/L (ref 135–145)

## 2019-07-23 LAB — MAGNESIUM: Magnesium: 2.7 mg/dL — ABNORMAL HIGH (ref 1.7–2.4)

## 2019-07-23 MED ORDER — PREDNISONE 20 MG PO TABS: 40.0000 mg | ORAL_TABLET | Freq: Every day | ORAL | 0 refills | Status: AC

## 2019-07-23 MED ORDER — HYDRALAZINE HCL 25 MG PO TABS: 25.0000 mg | ORAL_TABLET | Freq: Three times a day (TID) | ORAL | 0 refills | Status: DC

## 2019-07-23 MED ORDER — ALBUTEROL SULFATE HFA 108 (90 BASE) MCG/ACT IN AERS: 2.0000 | INHALATION_SPRAY | Freq: Four times a day (QID) | RESPIRATORY_TRACT | 0 refills | Status: DC | PRN

## 2019-07-23 MED ORDER — MOMETASONE FURO-FORMOTEROL FUM 100-5 MCG/ACT IN AERO: 2.0000 | INHALATION_SPRAY | Freq: Two times a day (BID) | RESPIRATORY_TRACT | 0 refills | Status: DC

## 2019-07-23 MED ORDER — CARVEDILOL 6.25 MG PO TABS: 6.2500 mg | ORAL_TABLET | Freq: Two times a day (BID) | ORAL | 0 refills | Status: DC

## 2019-07-23 MED ORDER — NITROGLYCERIN 0.4 MG SL SUBL: 0.4000 mg | SUBLINGUAL_TABLET | SUBLINGUAL | 0 refills | Status: DC | PRN

## 2019-07-23 MED ORDER — VITAMIN B-12 1000 MCG PO TABS: 1000.0000 ug | ORAL_TABLET | Freq: Every day | ORAL | 0 refills | Status: DC

## 2019-07-23 NOTE — Discharge Summary (Signed)
Physician Discharge Summary  Albert Harris [id number] DOB: 1956/06/02 DOA: 07/17/2019  PCP: Patient, No Pcp Per  Admit date: 07/17/2019 Discharge date: 07/23/2019  Admitted From: Home Disposition: Home  Recommendations for Outpatient Follow-up:  1. Follow up with PCP in 1 week with repeat CBC/BMP 2. Outpatient follow-up with nephrology/cardiology/pulmonary 3. Follow up in ED if symptoms worsen or new appear   Home Health: No Equipment/Devices: None  Discharge Condition: Stable CODE STATUS: Full Diet recommendation: Heart healthy/fluid restriction of up to 1500 cc a day  Brief/Interim Summary: 64 year old male with history of hypertension, COPD, ongoing tobacco abuse, chronic kidney disease stage III with creatinine around 1.3 in July 2019 presented with worsening shortness of breath.  In the ED, his blood pressure was 240/120 with chest x-ray showing trace bilateral pleural effusion along with elevated JVD and wheezing.  He was given intravenous Lasix.  Creatinine was found to be 5.38.  BNP was 2800.  Covid test was negative.  Cardiology was consulted.  Nephrology was subsequently consulted.  During the hospitalization, his respiratory status subsequently improved.  Intravenous Lasix was switched to oral Lasix and subsequently held because of worsening creatinine.  Creatinine still elevated but slightly improving.  Nephrology has cleared the patient for discharge with outpatient follow-up with nephrology.  He will need follow-up with cardiology and pulmonary as well.  Discharge Diagnoses:   Acute diastolic heart failure -Presented with worsening shortness of breath with BNP of 2800. -Cardiology signed off on 07/20/2019: Outpatient cardiology follow-up.  Continue oral Coreg and hydralazine.  Continue Fluid restriction of up to 1500 cc a day on discharge.  Comply with diet.  Negative balance of 1690 cc since admission. - Echo showed EF of 60 to 65% with grade 2 diastolic  dysfunction. -Treated with intravenous Lasix and subsequently switched to oral Lasix which was subsequently held because of increasing creatinine.  Will discharge patient off creatinine and diuretic resumption will be decided by nephrology as an outpatient. -Discharge patient home today.  Probable COPD exacerbation -Respiratory improved.  Off Solu-Medrol.  Continue oral prednisone 40 mg daily for total of 7 days.  Will need outpatient pulmonary evaluation. -Continue Dulera.  Continue as needed albuterol at home.  Acute kidney injury on chronic kidney disease stage III Acute metabolic acidosis -Last creatinine 1.3 in July 2019 in our system. -Presented with creatinine of 5.38.  Currently making urine.  Renal ultrasound was negative for hydronephrosis.  Nephrology following. -Creatinine 5.7 today.    Still making urine.  Nephrology has cleared the patient for discharge with outpatient follow-up with nephrology with repeat BMP at earliest convenience.  Hypertensive urgency -Blood pressure still high but improving.  Continue Coreg, hydralazine and amlodipine.  Chest pain -From uncontrolled blood pressure and CHF.  Much improved. -Troponins did not significantly trend up.    Macrocytic anemia -Probably from vitamin B12 deficiency.  Probable vitamin B12 deficiency -Vitamin B12 level is 222 which is at the lower end of normal.    Treated with parenteral vitamin B12 supplementation while inpatient  -discharge on oral supplement.  Tobacco abuse -Counseled regarding cessation    Discharge Instructions  Discharge Instructions    Ambulatory referral to Cardiology   Complete by: As directed    Ambulatory referral to Pulmonology   Complete by: As directed    COPD exacerbation   Diet - low sodium heart healthy   Complete by: As directed    Increase activity slowly   Complete by: As directed      Allergies  as of 07/23/2019   No Known Allergies     Medication List    STOP  taking these medications   acetaminophen 500 MG tablet Commonly known as: TYLENOL   amoxicillin-clavulanate 875-125 MG tablet Commonly known as: AUGMENTIN   aspirin 325 MG tablet   hydrochlorothiazide 25 MG tablet Commonly known as: HYDRODIURIL   metoprolol tartrate 25 MG tablet Commonly known as: LOPRESSOR     TAKE these medications   albuterol 108 (90 Base) MCG/ACT inhaler Commonly known as: VENTOLIN HFA Inhale 2 puffs into the lungs every 6 (six) hours as needed for wheezing or shortness of breath.   amLODipine 10 MG tablet Commonly known as: NORVASC Take 1 tablet (10 mg total) by mouth daily.   carvedilol 6.25 MG tablet Commonly known as: COREG Take 1 tablet (6.25 mg total) by mouth 2 (two) times daily with a meal.   hydrALAZINE 25 MG tablet Commonly known as: APRESOLINE Take 1 tablet (25 mg total) by mouth every 8 (eight) hours.   mometasone-formoterol 100-5 MCG/ACT Aero Commonly known as: DULERA Inhale 2 puffs into the lungs 2 (two) times daily.   nitroGLYCERIN 0.4 MG SL tablet Commonly known as: NITROSTAT Place 1 tablet (0.4 mg total) under the tongue every 5 (five) minutes x 3 doses as needed for chest pain.   predniSONE 20 MG tablet Commonly known as: DELTASONE Take 2 tablets (40 mg total) by mouth daily with breakfast for 7 days.   simvastatin 20 MG tablet Commonly known as: ZOCOR Take 1 tablet (20 mg total) by mouth at bedtime.   vitamin B-12 1000 MCG tablet Commonly known as: CYANOCOBALAMIN Take 1 tablet (1,000 mcg total) by mouth daily.      Follow-up Information    Croitoru, Mihai, MD Follow up.   Specialty: Cardiology Why: Office will call with a follow up appt Contact information: [address] [place] [address] [place] Follow up on 08/03/2019.   Why: 1:30 for hospital follow up Contact information: [place]  [phone]       Elmarie Shiley, MD. Go on 08/02/2019.   Specialty: Nephrology Why: Appointment with Anice Paganini P.A. at 1PM Contact information: [place] [address] [phone]          No Known Allergies  Consultations:  Cardiology/nephrology   Procedures/Studies: DG Chest 2 View  Result Date: 07/17/2019 CLINICAL DATA:  64 year old male with shortness of breath. EXAM: CHEST - 2 VIEW COMPARISON:  Chest radiograph dated 11/09/2017. FINDINGS: There is mild diffuse interstitial coarsening and bronchitic changes. There is mild hyperexpansion of the lungs likely related to COPD. Minimal left lung base atelectasis/scarring. Mild blunting of the costophrenic angles likely related to hyperexpansion or scarring. Small pleural effusions are not excluded. No focal consolidation or pneumothorax. Stable cardiac silhouette. No acute osseous pathology. IMPRESSION: 1. Chronic hyperinflation, likely related to underlying COPD. 2. Bibasilar atelectasis/scarring versus less likely trace pleural effusions. Clinical correlation recommended. No focal consolidation. Electronically Signed   By: Anner Crete M.D.   On: 07/17/2019 19:01   US RENAL  Result Date: 07/18/2019 CLINICAL DATA:  Acute renal failure EXAM: RENAL / URINARY TRACT ULTRASOUND COMPLETE COMPARISON:  None. FINDINGS: Right Kidney: Renal measurements: 11 x 4.5 x 4.4 cm = volume: 112 mL . Echogenicity within normal limits. No mass or hydronephrosis visualized. Left Kidney: Renal measurements: 10 x 4.7 x 4.4 cm = volume:  111 mL. Echogenicity within normal limits. No mass or hydronephrosis visualized. Bladder: Appears normal for degree of bladder distention. Other: Bilateral pleural effusion which is small based on preceding chest x-ray. IMPRESSION: 1. Negative renal ultrasound. 2. Bilateral pleural effusion, likely small volume. Electronically Signed   By: Monte Fantasia M.D.   On: 07/18/2019 07:07   ECHOCARDIOGRAM  COMPLETE  Result Date: 07/18/2019    ECHOCARDIOGRAM REPORT   Patient Name:   Albert Harris Date of Exam: 07/18/2019 Medical Rec #:  [id number]      Height:       68.0 in Accession #:    [id number]     Weight:       144.0 lb Date of Birth:  1955/07/26     BSA:          1.78 m Patient Age:    59 years       BP:           177/88 mmHg Patient Gender: M              HR:           77 bpm. Exam Location:  Inpatient Procedure: 2D Echo, Cardiac Doppler and Color Doppler Indications:    Congestive Heart Failure 428.0  History:        Patient has prior history of Echocardiogram examinations, most                 recent 11/10/2017. [name] and COPD; Risk Factors:Hypertension and                 Current Smoker.  Sonographer:    Vickie Epley RDCS Referring Phys: [place]  1. Left ventricular ejection fraction, by estimation, is 60 to 65%. The left ventricle has normal function. The left ventrical has no regional wall motion abnormalities. There is mildly increased left ventricular hypertrophy. Left ventricular diastolic parameters are consistent with Grade II diastolic dysfunction (pseudonormalization). Elevated left ventricular end-diastolic pressure.  2. Right ventricular systolic function is normal. The right ventricular size is normal. There is mildly elevated pulmonary artery systolic pressure. The estimated right ventricular systolic pressure is [id number] mmHg.  3. Left atrial size was mild to moderately dilated.  4. Mild mitral valve regurgitation.  5. The aortic valve is normal in structure and function. Aortic valve regurgitation is not visualized. No aortic stenosis is present.  6. The inferior vena cava is dilated in size with <50% respiratory variability, suggesting right atrial pressure of 15 mmHg. FINDINGS  Left Ventricle: Left ventricular ejection fraction, by estimation, is 60 to 65%. The left ventricle has normal function. The left ventricle has no regional wall motion abnormalities. The left  ventricular internal cavity size was normal in size. There is  mildly increased left ventricular hypertrophy. Left ventricular diastolic parameters are consistent with Grade II diastolic dysfunction (pseudonormalization). Right Ventricle: The right ventricular size is normal. No increase in right ventricular wall thickness. Right ventricular systolic function is normal. There is mildly elevated pulmonary artery systolic pressure. The tricuspid regurgitant velocity is 2.37  m/s, and with an assumed right atrial pressure of 15 mmHg, the estimated right ventricular systolic pressure is [id number] mmHg. Left Atrium: Left atrial size was mild to moderately dilated. Right Atrium: Right atrial size was normal in size. Pericardium: There is no evidence of pericardial effusion. Mitral Valve: The mitral valve is normal in structure and function. Normal mobility of the mitral valve leaflets. Moderate mitral annular calcification. Mild mitral  valve regurgitation. No evidence of mitral valve stenosis. Tricuspid Valve: The tricuspid valve is normal in structure. Tricuspid valve regurgitation is mild . No evidence of tricuspid stenosis. Aortic Valve: The aortic valve is normal in structure and function. Aortic valve regurgitation is not visualized. No aortic stenosis is present. Pulmonic Valve: The pulmonic valve was normal in structure. Pulmonic valve regurgitation is not visualized. No evidence of pulmonic stenosis. Aorta: The aortic root is normal in size and structure. Venous: The inferior vena cava is dilated in size with less than 50% respiratory variability, suggesting right atrial pressure of 15 mmHg. The inferior vena cava and the hepatic vein show a normal flow pattern. IAS/Shunts: No atrial level shunt detected by color flow Doppler.  LEFT VENTRICLE PLAX 2D LVIDd:         4.50 cm       Diastology LVIDs:         3.40 cm       LV e' lateral:   4.79 cm/s LV PW:         1.20 cm       LV E/e' lateral: 18.5 LV IVS:        1.20 cm        LV e' medial:    4.57 cm/s LVOT diam:     1.70 cm       LV E/e' medial:  19.4 LV SV:         54.02 ml LV SV Index:   25.43 LVOT Area:     2.27 cm  LV Volumes (MOD) LV area d, A2C:    43.40 cm LV area d, A4C:    42.20 cm LV area s, A2C:    26.40 cm LV area s, A4C:    25.90 cm LV major d, A2C:   9.41 cm LV major d, A4C:   9.15 cm LV major s, A2C:   7.76 cm LV major s, A4C:   7.39 cm LV vol d, MOD A2C: 167.0 ml LV vol d, MOD A4C: 160.0 ml LV vol s, MOD A2C: 75.6 ml LV vol s, MOD A4C: 77.3 ml LV SV MOD A2C:     91.4 ml LV SV MOD A4C:     160.0 ml LV SV MOD BP:      87.6 ml RIGHT VENTRICLE RV S prime:     15.00 cm/s TAPSE (M-mode): 2.4 cm LEFT ATRIUM             Index       RIGHT ATRIUM           Index LA diam:        4.20 cm 2.36 cm/m  RA Area:     15.90 cm LA Vol (A2C):   72.6 ml 40.85 ml/m RA Volume:   44.50 ml  25.04 ml/m LA Vol (A4C):   69.7 ml 39.22 ml/m LA Biplane Vol: 77.0 ml 43.33 ml/m  AORTIC VALVE LVOT Vmax:   137.00 cm/s LVOT Vmean:  86.900 cm/s LVOT VTI:    0.238 m  AORTA Ao Root diam: 2.90 cm MITRAL VALVE                        TRICUSPID VALVE MV Area (PHT): 3.63 cm             TR Peak grad:   22.5 mmHg MV Decel Time: 209 msec             TR Vmax:  237.00 cm/s MR Peak grad: 98.4 mmHg MR Vmax:      496.00 cm/s           SHUNTS MV E velocity: 88.80 cm/s 103 cm/s  Systemic VTI:  0.24 m MV A velocity: 79.60 cm/s 70.3 cm/s Systemic Diam: 1.70 cm MV E/A ratio:  1.12       1.5 Fransico Him MD Electronically signed by Fransico Him MD Signature Date/Time: 07/18/2019/10:28:13 AM    Final        Subjective: Patient seen and examined at bedside.  He denies worsening shortness of breath.  Feels better and wants to go home.  No overnight fever or vomiting.  Discharge Exam: Vitals:   07/23/19 0418 07/23/19 0858  BP: (!) 161/87 (!) 156/85  Pulse: 73 70  Resp: 20 18  Temp: 98 F (36.7 C) (!) 97.3 F (36.3 C)  SpO2: 98% 98%    General: Pt is alert, awake, not in acute  distress Cardiovascular: rate controlled, S1/S2 + Respiratory: bilateral decreased breath sounds at bases with some scattered crackles Abdominal: Soft, NT, ND, bowel sounds + Extremities: Trace lower extremity edema, no cyanosis    The results of significant diagnostics from this hospitalization (including imaging, microbiology, ancillary and laboratory) are listed below for reference.     Microbiology: Recent Results (from the past 240 hour(s))  Respiratory Panel by RT PCR (Flu A&B, Covid) - Nasopharyngeal Swab     Status: None   Collection Time: 07/18/19  2:06 AM   Specimen: Nasopharyngeal Swab  Result Value Ref Range Status   SARS Coronavirus 2 by RT PCR NEGATIVE NEGATIVE Final    Comment: (NOTE) SARS-CoV-2 target nucleic acids are NOT DETECTED. The SARS-CoV-2 RNA is generally detectable in upper respiratoy specimens during the acute phase of infection. The lowest concentration of SARS-CoV-2 viral copies this assay can detect is 131 copies/mL. A negative result does not preclude SARS-Cov-2 infection and should not be used as the sole basis for treatment or other patient management decisions. A negative result may occur with  improper specimen collection/handling, submission of specimen other than nasopharyngeal swab, presence of viral mutation(s) within the areas targeted by this assay, and inadequate number of viral copies (<131 copies/mL). A negative result must be combined with clinical observations, patient history, and epidemiological information. The expected result is Negative. Fact Sheet for Patients:  PinkCheek.be Fact Sheet for Healthcare Providers:  GravelBags.it This test is not yet ap proved or cleared by the [place] FDA and  has been authorized for detection and/or diagnosis of SARS-CoV-2 by FDA under an Emergency Use Authorization (EUA). This EUA will remain  in effect (meaning this test can be  used) for the duration of the COVID-19 declaration under Section 564(b)(1) of the Act, 21 U.S.C. section 360bbb-3(b)(1), unless the authorization is terminated or revoked sooner.    Influenza A by PCR NEGATIVE NEGATIVE Final   Influenza B by PCR NEGATIVE NEGATIVE Final    Comment: (NOTE) The Xpert Xpress SARS-CoV-2/FLU/RSV assay is intended as an aid in  the diagnosis of influenza from Nasopharyngeal swab specimens and  should not be used as a sole basis for treatment. Nasal washings and  aspirates are unacceptable for Xpert Xpress SARS-CoV-2/FLU/RSV  testing. Fact Sheet for Patients: PinkCheek.be Fact Sheet for Healthcare Providers: GravelBags.it This test is not yet approved or cleared by the [place] FDA and  has been authorized for detection and/or diagnosis of SARS-CoV-2 by  FDA under an Emergency Use Authorization (EUA). This EUA  will remain  in effect (meaning this test can be used) for the duration of the  Covid-19 declaration under Section 564(b)(1) of the Act, 21  U.S.C. section 360bbb-3(b)(1), unless the authorization is  terminated or revoked. Performed at [facility], [place] [address] [place], [place] [address]      Labs: BNP (last 3 results) Recent Labs    07/18/19 0153  BNP [id number]*   Basic Metabolic Panel: Recent Labs  Lab 07/18/19 0529 07/19/19 0722 07/20/19 0435 07/21/19 0447 07/22/19 0620 07/22/19 1458 07/23/19 0713  NA 140   < > 137 138 138 137 140  K 4.3   < > 5.3* 5.0 4.1 4.4 4.2  CL 110   < > 105 106 108 107 111  CO2 16*   < > 17* 19* 17* 17* 17*  GLUCOSE 110*   < > 134* 114* 89 179* 83  BUN 45*   < > 67* 85* 100* 106* 100*  CREATININE 5.23*   < > 5.83* 5.89* 6.18* 6.44* 5.70*  CALCIUM 8.9   < > 8.9 8.9 8.5* 8.3* 8.5*  MG 2.4  --  2.3 2.6* 2.5*  --  2.7*  PHOS  --   --  5.6*  --   --   --   --    < > = values in this interval not displayed.   Liver Function  Tests: Recent Labs  Lab 07/18/19 0529 07/20/19 0435  AST 19  --   ALT 19  --   ALKPHOS 70  --   BILITOT 0.9  --   PROT 6.2*  --   ALBUMIN 2.9* 2.8*   No results for input(s): LIPASE, AMYLASE in the last 168 hours. No results for input(s): AMMONIA in the last 168 hours. CBC: Recent Labs  Lab 07/17/19 1849 07/18/19 0529 07/21/19 0447  WBC 9.1 11.4* 13.3*  NEUTROABS  --  10.2* 12.3*  HGB 11.8* 12.0* 10.6*  HCT 37.7* 38.1* 32.3*  MCV 100.3* 100.8* 95.8  PLT 181 182 155   Cardiac Enzymes: No results for input(s): CKTOTAL, CKMB, CKMBINDEX, TROPONINI in the last 168 hours. BNP: Invalid input(s): POCBNP CBG: No results for input(s): GLUCAP in the last 168 hours. D-Dimer No results for input(s): DDIMER in the last 72 hours. Hgb A1c No results for input(s): HGBA1C in the last 72 hours. Lipid Profile No results for input(s): CHOL, HDL, LDLCALC, TRIG, CHOLHDL, LDLDIRECT in the last 72 hours. Thyroid function studies No results for input(s): TSH, T4TOTAL, T3FREE, THYROIDAB in the last 72 hours.  Invalid input(s): FREET3 Anemia work up No results for input(s): VITAMINB12, FOLATE, FERRITIN, TIBC, IRON, RETICCTPCT in the last 72 hours. Urinalysis    Component Value Date/Time   COLORURINE STRAW (A) 07/18/2019 0509   APPEARANCEUR CLEAR 07/18/2019 0509   LABSPEC 1.012 07/18/2019 0509   PHURINE 5.0 07/18/2019 0509   GLUCOSEU 50 (A) 07/18/2019 0509   HGBUR SMALL (A) 07/18/2019 0509   BILIRUBINUR NEGATIVE 07/18/2019 0509   KETONESUR NEGATIVE 07/18/2019 0509   PROTEINUR >=300 (A) 07/18/2019 0509   UROBILINOGEN 1.0 03/19/2014 1051   NITRITE NEGATIVE 07/18/2019 0509   LEUKOCYTESUR NEGATIVE 07/18/2019 0509   Sepsis Labs Invalid input(s): PROCALCITONIN,  WBC,  LACTICIDVEN Microbiology Recent Results (from the past 240 hour(s))  Respiratory Panel by RT PCR (Flu A&B, Covid) - Nasopharyngeal Swab     Status: None   Collection Time: 07/18/19  2:06 AM   Specimen: Nasopharyngeal  Swab  Result Value Ref Range Status  SARS Coronavirus 2 by RT PCR NEGATIVE NEGATIVE Final    Comment: (NOTE) SARS-CoV-2 target nucleic acids are NOT DETECTED. The SARS-CoV-2 RNA is generally detectable in upper respiratoy specimens during the acute phase of infection. The lowest concentration of SARS-CoV-2 viral copies this assay can detect is 131 copies/mL. A negative result does not preclude SARS-Cov-2 infection and should not be used as the sole basis for treatment or other patient management decisions. A negative result may occur with  improper specimen collection/handling, submission of specimen other than nasopharyngeal swab, presence of viral mutation(s) within the areas targeted by this assay, and inadequate number of viral copies (<131 copies/mL). A negative result must be combined with clinical observations, patient history, and epidemiological information. The expected result is Negative. Fact Sheet for Patients:  PinkCheek.be Fact Sheet for Healthcare Providers:  GravelBags.it This test is not yet ap proved or cleared by the [place] FDA and  has been authorized for detection and/or diagnosis of SARS-CoV-2 by FDA under an Emergency Use Authorization (EUA). This EUA will remain  in effect (meaning this test can be used) for the duration of the COVID-19 declaration under Section 564(b)(1) of the Act, 21 U.S.C. section 360bbb-3(b)(1), unless the authorization is terminated or revoked sooner.    Influenza A by PCR NEGATIVE NEGATIVE Final   Influenza B by PCR NEGATIVE NEGATIVE Final    Comment: (NOTE) The Xpert Xpress SARS-CoV-2/FLU/RSV assay is intended as an aid in  the diagnosis of influenza from Nasopharyngeal swab specimens and  should not be used as a sole basis for treatment. Nasal washings and  aspirates are unacceptable for Xpert Xpress SARS-CoV-2/FLU/RSV  testing. Fact Sheet for  Patients: PinkCheek.be Fact Sheet for Healthcare Providers: GravelBags.it This test is not yet approved or cleared by the [place] FDA and  has been authorized for detection and/or diagnosis of SARS-CoV-2 by  FDA under an Emergency Use Authorization (EUA). This EUA will remain  in effect (meaning this test can be used) for the duration of the  Covid-19 declaration under Section 564(b)(1) of the Act, 21  U.S.C. section 360bbb-3(b)(1), unless the authorization is  terminated or revoked. Performed at [facility], [place] [address] [place], [place] [address]      Time coordinating discharge: 35 minutes  SIGNED:   Aline August, MD  [facility] 07/23/2019, 9:25 AM

## 2019-07-23 NOTE — Progress Notes (Signed)
[name], male   DOB: [birth year], 64 y.o.   MRN: [id number]  [facility] Progress Note    Assessment/ Plan:   1. Acute kidney Injury on chronic kidney disease stage III: His acute kidney injury appears to be hemodynamically mediated however, I suspect that he has had progression of his baseline chronic kidney disease due to poorly controlled hypertension over the past >1-year.  He is euvolemic on physical exam and I recommend sending him home without any diuretic.  He has a follow-up visit at [facility] in 10 days to assess renal function and at that point, we will decide on additional antihypertensive therapy/diuresis. 2.  Hypertension: Blood pressure remains elevated and we will continue to titrate antihypertensive therapy as an outpatient.  Presented earlier with hypertensive urgency and what appeared to be CHF exacerbation with transient treatment with diuretics. 3.  Anemia: Downtrending hemoglobin and hematocrit noted however without overt loss.  We will continue to follow this as an outpatient suspecting mild anemia of chronic kidney disease. 4.  Non-anion gap metabolic acidosis: Mild.  We will continue to follow with ongoing lab monitoring as an outpatient.  This indeed appears to be unusual in the setting of COPD. 5.  Acute exacerbation of COPD: On bronchodilators and corticosteroids.  Subjective:   Reports to be feeling fair, denies any chest pain or shortness of breath.   Objective:   BP (!) 161/87 (BP Location: Left Arm)   Pulse 73   Temp 98 F (36.7 C) (Oral)   Resp 20   Ht 5\' 8"  (1.727 m)   Wt 64.3 kg   SpO2 98%   BMI 21.55 kg/m   Intake/Output Summary (Last 24 hours) at 07/23/2019 0851 Last data filed at 07/23/2019 0600 Gross per 24 hour  Intake 1200 ml  Output 1475 ml  Net -275 ml   Weight change: 0.045 kg  Physical Exam: Gen: Comfortably sitting up on the side of his bed drinking coffee CVS: Pulse regular rhythm,  normal rate, S1 and S2 normal Resp: Distant breath sounds bilaterally without distinct rales or rhonchi Abd: Soft, flat, nontender Ext: No lower extremity edema  Imaging: No results found.  Labs: BMET Recent Labs  Lab 07/18/19 0529 07/19/19 0722 07/20/19 0435 07/21/19 0447 07/22/19 0620 07/22/19 1458 07/23/19 0713  NA 140 139 137 138 138 137 140  K 4.3 4.4 5.3* 5.0 4.1 4.4 4.2  CL 110 110 105 106 108 107 111  CO2 16* 16* 17* 19* 17* 17* 17*  GLUCOSE 110* 104* 134* 114* 89 179* 83  BUN 45* 54* 67* 85* 100* 106* 100*  CREATININE 5.23* 5.27* 5.83* 5.89* 6.18* 6.44* 5.70*  CALCIUM 8.9 8.9 8.9 8.9 8.5* 8.3* 8.5*  PHOS  --   --  5.6*  --   --   --   --    CBC Recent Labs  Lab 07/17/19 1849 07/18/19 0529 07/21/19 0447  WBC 9.1 11.4* 13.3*  NEUTROABS  --  10.2* 12.3*  HGB 11.8* 12.0* 10.6*  HCT 37.7* 38.1* 32.3*  MCV 100.3* 100.8* 95.8  PLT 181 182 155   Medications:    . amLODipine  10 mg Oral Daily  . carvedilol  6.25 mg Oral BID WC  . cyanocobalamin  1,000 mcg Intramuscular Q1400  . heparin  5,000 Units Subcutaneous Q8H  . hydrALAZINE  25 mg Oral Q8H  . influenza vaccine adjuvanted  0.5 mL Intramuscular Tomorrow-1000  . mometasone-formoterol  2 puff Inhalation BID  .  nicotine  7 mg Transdermal Daily  . predniSONE  40 mg Oral Q breakfast    Elmarie Shiley, MD 07/23/2019, 8:51 AM

## 2019-07-23 NOTE — Plan of Care (Signed)
  Problem: Education: Goal: Knowledge of General Education information will improve Description: Including pain rating scale, medication(s)/side effects and non-pharmacologic comfort measures Outcome: Adequate for Discharge   Problem: Education: Goal: Knowledge of disease or condition will improve Outcome: Adequate for Discharge Goal: Knowledge of the prescribed therapeutic regimen will improve Outcome: Adequate for Discharge Goal: Individualized Educational Video(s) Outcome: Adequate for Discharge   Problem: Activity: Goal: Ability to tolerate increased activity will improve Outcome: Adequate for Discharge Goal: Will verbalize the importance of balancing activity with adequate rest periods Outcome: Adequate for Discharge   Problem: Respiratory: Goal: Ability to maintain a clear airway will improve Outcome: Adequate for Discharge Goal: Levels of oxygenation will improve Outcome: Adequate for Discharge Goal: Ability to maintain adequate ventilation will improve Outcome: Adequate for Discharge   Problem: Education: Goal: Ability to demonstrate management of disease process will improve Outcome: Adequate for Discharge Goal: Ability to verbalize understanding of medication therapies will improve Outcome: Adequate for Discharge Goal: Individualized Educational Video(s) Outcome: Adequate for Discharge   Problem: Activity: Goal: Capacity to carry out activities will improve Outcome: Adequate for Discharge   Problem: Cardiac: Goal: Ability to achieve and maintain adequate cardiopulmonary perfusion will improve Outcome: Adequate for Discharge   Problem: Education: Goal: Ability to demonstrate management of disease process will improve Outcome: Adequate for Discharge Goal: Ability to verbalize understanding of medication therapies will improve Outcome: Adequate for Discharge Goal: Individualized Educational Video(s) Outcome: Adequate for Discharge   Problem: Activity: Goal:  Capacity to carry out activities will improve Outcome: Adequate for Discharge   Problem: Cardiac: Goal: Ability to achieve and maintain adequate cardiopulmonary perfusion will improve Outcome: Adequate for Discharge

## 2019-08-02 MED FILL — FUROSEMIDE 20 MG TABS: 20 | 30 days supply | Qty: 60 | Fill #0

## 2019-08-02 MED FILL — AMLODIPINE BESYLATE 5 MG TA: 5 | 30 days supply | Qty: 30 | Fill #0

## 2019-08-03 ENCOUNTER — Other Ambulatory Visit: Payer: Self-pay

## 2019-08-03 ENCOUNTER — Ambulatory Visit (INDEPENDENT_AMBULATORY_CARE_PROVIDER_SITE_OTHER): Payer: Medicare Other | Admitting: Primary Care

## 2019-08-03 ENCOUNTER — Encounter (INDEPENDENT_AMBULATORY_CARE_PROVIDER_SITE_OTHER): Payer: Self-pay | Admitting: Primary Care

## 2019-08-03 VITALS — BP 207/107 | HR 62 | Temp 97.3°F | Ht 66.0 in

## 2019-08-03 DIAGNOSIS — Z09 Encounter for follow-up examination after completed treatment for conditions other than malignant neoplasm: Secondary | ICD-10-CM | POA: Diagnosis not present

## 2019-08-03 DIAGNOSIS — I10 Essential (primary) hypertension: Secondary | ICD-10-CM | POA: Diagnosis not present

## 2019-08-03 MED ORDER — CLONIDINE HCL 0.1 MG PO TABS
0.2000 mg | ORAL_TABLET | Freq: Once | ORAL | Status: AC
  Administered 2019-08-03: 0.2 mg via ORAL

## 2019-08-03 NOTE — Patient Instructions (Signed)
Stress taking all blood pressure medication prescribed and discussed with Nephrologist office on yesterday . Managing Your Hypertension Hypertension is commonly called high blood pressure. This is when the force of your blood pressing against the walls of your arteries is too strong. Arteries are blood vessels that carry blood from your heart throughout your body. Hypertension forces the heart to work harder to pump blood, and may cause the arteries to become narrow or stiff. Having untreated or uncontrolled hypertension can cause heart attack, stroke, kidney disease, and other problems. What are blood pressure readings? A blood pressure reading consists of a higher number over a lower number. Ideally, your blood pressure should be below 120/80. The first ("top") number is called the systolic pressure. It is a measure of the pressure in your arteries as your heart beats. The second ("bottom") number is called the diastolic pressure. It is a measure of the pressure in your arteries as the heart relaxes. What does my blood pressure reading mean? Blood pressure is classified into four stages. Based on your blood pressure reading, your health care provider may use the following stages to determine what type of treatment you need, if any. Systolic pressure and diastolic pressure are measured in a unit called mm Hg. Normal  Systolic pressure: below 123456.  Diastolic pressure: below 80. Elevated  Systolic pressure: [id number].  Diastolic pressure: below 80. Hypertension stage 1  Systolic pressure: 0000000.  Diastolic pressure: [id number]. Hypertension stage 2  Systolic pressure: XX123456 or above.  Diastolic pressure: 90 or above. What health risks are associated with hypertension? Managing your hypertension is an important responsibility. Uncontrolled hypertension can lead to:  A heart attack.  A stroke.  A weakened blood vessel (aneurysm).  Heart failure.  Kidney damage.  Eye damage.  Metabolic  syndrome.  Memory and concentration problems. What changes can I make to manage my hypertension? Hypertension can be managed by making lifestyle changes and possibly by taking medicines. Your health care provider will help you make a plan to bring your blood pressure within a normal range. Eating and drinking   Eat a diet that is high in fiber and potassium, and low in salt (sodium), added sugar, and fat. An example eating plan is called the DASH (Dietary Approaches to Stop Hypertension) diet. To eat this way: ? Eat plenty of fresh fruits and vegetables. Try to fill half of your plate at each meal with fruits and vegetables. ? Eat whole grains, such as whole wheat pasta, brown rice, or whole grain bread. Fill about one quarter of your plate with whole grains. ? Eat low-fat diary products. ? Avoid fatty cuts of meat, processed or cured meats, and poultry with skin. Fill about one quarter of your plate with lean proteins such as fish, chicken without skin, beans, eggs, and tofu. ? Avoid premade and processed foods. These tend to be higher in sodium, added sugar, and fat.  Reduce your daily sodium intake. Most people with hypertension should eat less than 1,500 mg of sodium a day.  Limit alcohol intake to no more than 1 drink a day for nonpregnant women and 2 drinks a day for men. One drink equals 12 oz of beer, 5 oz of wine, or 1 oz of hard liquor. Lifestyle  Work with your health care provider to maintain a healthy body weight, or to lose weight. Ask what an ideal weight is for you.  Get at least 30 minutes of exercise that causes your heart to beat faster (aerobic exercise)  most days of the week. Activities may include walking, swimming, or biking.  Include exercise to strengthen your muscles (resistance exercise), such as weight lifting, as part of your weekly exercise routine. Try to do these types of exercises for 30 minutes at least 3 days a week.  Do not use any products that contain  nicotine or tobacco, such as cigarettes and e-cigarettes. If you need help quitting, ask your health care provider.  Control any long-term (chronic) conditions you have, such as high cholesterol or diabetes. Monitoring  Monitor your blood pressure at home as told by your health care provider. Your personal target blood pressure may vary depending on your medical conditions, your age, and other factors.  Have your blood pressure checked regularly, as often as told by your health care provider. Working with your health care provider  Review all the medicines you take with your health care provider because there may be side effects or interactions.  Talk with your health care provider about your diet, exercise habits, and other lifestyle factors that may be contributing to hypertension.  Visit your health care provider regularly. Your health care provider can help you create and adjust your plan for managing hypertension. Will I need medicine to control my blood pressure? Your health care provider may prescribe medicine if lifestyle changes are not enough to get your blood pressure under control, and if:  Your systolic blood pressure is 130 or higher.  Your diastolic blood pressure is 80 or higher. Take medicines only as told by your health care provider. Follow the directions carefully. Blood pressure medicines must be taken as prescribed. The medicine does not work as well when you skip doses. Skipping doses also puts you at risk for problems. Contact a health care provider if:  You think you are having a reaction to medicines you have taken.  You have repeated (recurrent) headaches.  You feel dizzy.  You have swelling in your ankles.  You have trouble with your vision. Get help right away if:  You develop a severe headache or confusion.  You have unusual weakness or numbness, or you feel faint.  You have severe pain in your chest or abdomen.  You vomit repeatedly.  You have  trouble breathing. Summary  Hypertension is when the force of blood pumping through your arteries is too strong. If this condition is not controlled, it may put you at risk for serious complications.  Your personal target blood pressure may vary depending on your medical conditions, your age, and other factors. For most people, a normal blood pressure is less than 120/80.  Hypertension is managed by lifestyle changes, medicines, or both. Lifestyle changes include weight loss, eating a healthy, low-sodium diet, exercising more, and limiting alcohol. This information is not intended to replace advice given to you by your health care provider. Make sure you discuss any questions you have with your health care provider. Document Revised: 09/16/2018 Document Reviewed: 04/22/2016 Elsevier Patient Education  [place].

## 2019-08-03 NOTE — Progress Notes (Signed)
Subjective:  [name], male    DOB: 08-11-1955  Age: 64 y.o. MRN: [id number]  CC: acute respiratory failure with hypoxia   HPI Albert Harris presents for hospital discharge. Presented to the Urgent care felt he needed to go to the Emergency room him and the provider walked to the ED. !3 hours later he was admitted for Hypertensive urgency, Acute pulmary edema, ARF (acute renal failure) and Macrocytic anemia. He states he stopped smoking on  07/17/2019. After 50 years of smoking was trying the patch which did not stay he said I am just going to stop.   on Outpatient Medications Prior to Visit  Medication Sig Dispense Refill  . albuterol (VENTOLIN HFA) 108 (90 Base) MCG/ACT inhaler Inhale 2 puffs into the lungs every 6 (six) hours as needed for wheezing or shortness of breath. 8 g 0  . carvedilol (COREG) 6.25 MG tablet Take 1 tablet (6.25 mg total) by mouth 2 (two) times daily with a meal. 60 tablet 0  . hydrALAZINE (APRESOLINE) 25 MG tablet Take 1 tablet (25 mg total) by mouth every 8 (eight) hours. 90 tablet 0  . mometasone-formoterol (DULERA) 100-5 MCG/ACT AERO Inhale 2 puffs into the lungs 2 (two) times daily. 8.8 g 0  . nitroGLYCERIN (NITROSTAT) 0.4 MG SL tablet Place 1 tablet (0.4 mg total) under the tongue every 5 (five) minutes x 3 doses as needed for chest pain. 30 tablet 0  . vitamin B-12 (CYANOCOBALAMIN) 1000 MCG tablet Take 1 tablet (1,000 mcg total) by mouth daily. 30 tablet 0  . amLODipine (NORVASC) 10 MG tablet Take 1 tablet (10 mg total) by mouth daily. (Patient not taking: Reported on 07/18/2019) 30 tablet 5  . simvastatin (ZOCOR) 20 MG tablet Take 1 tablet (20 mg total) by mouth at bedtime. (Patient not taking: Reported on 07/18/2019) 30 tablet 5   No facility-administered medications prior to visit.    ROS Review of Systems  All other systems reviewed and are negative.   Objective:  BP (!) 207/107 (BP Location: Right Arm, Patient Position: Sitting, Cuff  Size: Normal)   Pulse 62   Temp (!) 97.3 F (36.3 C) (Temporal)   Ht 5\' 6"  (1.676 m)   BMI 22.87 kg/m   BP/Weight 08/03/2019 [id number]  Systolic BP A999333 A999333 -  Diastolic BP XX123456 85 -  Wt. (Lbs) - 141.7 -  BMI 22.87 - 21.55     Physical Exam Vitals reviewed.  Constitutional:      Appearance: Normal appearance.  HENT:     Head: Normocephalic.     Nose: Nose normal.  Eyes:     Extraocular Movements: Extraocular movements intact.     Pupils: Pupils are equal, round, and reactive to light.  Cardiovascular:     Rate and Rhythm: Normal rate and regular rhythm.  Pulmonary:     Effort: Pulmonary effort is normal.     Breath sounds: Normal breath sounds.  Abdominal:     General: Bowel sounds are normal.  Musculoskeletal:        General: Normal range of motion.     Cervical back: Normal range of motion and neck supple.  Skin:    General: Skin is warm and dry.  Neurological:     Mental Status: He is alert and oriented to person, place, and time.  Psychiatric:        Mood and Affect: Mood normal.        Behavior: Behavior normal.  Thought Content: Thought content normal.        Judgment: Judgment normal.      Assessment & Plan:  Albert Harris was seen today for acute respiratory failure with hypoxia.  Diagnoses and all orders for this visit:  Essential hypertension Patient  Presents with blood pressure 207/107 in clinic treated with rebound in effective  chronic kidney disease stage III creatinine is around 1.3 . Spoke with nephrologist he was seen the day before and blood pressure will not lower. Asked to remind him to take blood pressure as prescribed every day. Information provided to patient and Nephrologist follow up scheduled.    cloNIDine (CATAPRES) tablet 0.2 mg  Hospital discharge follow-up Acute respiratory failure with hypoxia (HCC) Acute pulmonary edema underling contributing factors  Fluids overload and uncontrolled hypertension and possible COPD .  Follow up with pulmonology     Hypertensive urgency in lieu of uncontrolled hypertension discharged on Bp medications and follow up with cardiology     ARF (acute renal failure) ([place])  Seen and followed by nephrology   Follow-up: Return if symptoms worsen or fail to improve.   Albert Perna NP

## 2019-08-07 MED FILL — VIT D2 1.25 MG (50,000 UNIT: 1.25 MG | 84 days supply | Qty: 12 | Fill #0

## 2019-08-09 NOTE — Progress Notes (Signed)
Cardiology Office Note:    Date:  08/10/2019   ID:  Albert Harris, DOB Mar 16, 1956, MRN [id number]  PCP:  Albert Perna, NP  Cardiologist:  Albert Klein, MD   Referring MD: Albert August, MD   Chief Complaint  Patient presents with  . Hospitalization Follow-up    uncontrolled hypertension    History of Present Illness:    Albert Harris is a 64 y.o. male with a hx of hypertension, HLD, [name], PFO, ongoing tobacco abuse with suspected COPD and noncompliance. He was initially seen in consult by Dr. Sallyanne Harris for PFO in the setting of a [name]. He was recently admitted 07/2019 after not taking any medications for more than 1 year. He presented with progressive cough, worsening SOB, orthopnea, and PND. He was hypertensive to 228/173 with AKI and sCr 5.38, and BNP 2859. HS troponins mildly elevated and flat consistent with demand ischemia. Echocardiogram showed normal EF of 60-65% without RWMA, grade 2 DD and elevated LVEDP. He was diuresed. Renal ultrasound negative for stenosis. Given acute on chronic kidney injury, we deferred final diuretic recommendations to nephrology. Nephrology recommended discharge without diuretic with close follow up in OP clinic for diuretic and antihypertensive medication titration.   He was seen by internal medicine 08/03/19 with a BP of 207/107. Provider spoke with nephrologist. Clonidine was mentioned in the progress note, but I do not think this was started. Given his noncompliance, I do not think this would be a good medication for him right now.  Pt presents for cardiology follow up. He is hypertensive on arrival to 194/110. He did not bring his medications. He was able to call his wife who summarizes the below medications: Lasix 20 mg BID Amlodipine 10 mg Coreg 6.25 mg BID Hydralazine 25 mg TID  He has not been taking coreg or hydralazine because he thinks they make him feel bad - makes him have a cold sweat and chest pain relieved with nitro. Upon further  questioning, he got his medications mixed up last night. Stressed the importance of using a pill box. We had a long discussion about outcomes from uncontrolled HTN. Seems like he has poor insight. No headache or blurry vision today or since discharge.   Past Medical History:  Diagnosis Date  . Asthma   . Complication of anesthesia    " i HAVE A HARD TIME WAKING UP "  . Hypertension   . PFO with atrial septal aneurysm    by echo 11/2017  . [name] (transient ischemic attack) 11/2017  . Tobacco abuse     Past Surgical History:  Procedure Laterality Date  . COLONOSCOPY    . FINGER SURGERY      Current Medications: Current Meds  Medication Sig  . amLODipine (NORVASC) 5 MG tablet Take 5 mg by mouth daily.  . carvedilol (COREG) 6.25 MG tablet Take 1 tablet (6.25 mg total) by mouth 2 (two) times daily with a meal.  . furosemide (LASIX) 20 MG tablet Take 20 mg by mouth 2 (two) times daily.  . hydrALAZINE (APRESOLINE) 25 MG tablet Take 1 tablet (25 mg total) by mouth every 8 (eight) hours.  . Vitamin D, Ergocalciferol, (DRISDOL) 1.25 MG (50000 UNIT) CAPS capsule Take 50,000 Units by mouth every 7 (seven) days.  . [DISCONTINUED] amLODipine (NORVASC) 10 MG tablet Take 1 tablet (10 mg total) by mouth daily.  . [DISCONTINUED] carvedilol (COREG) 6.25 MG tablet Take 1 tablet (6.25 mg total) by mouth 2 (two) times daily with a  meal.  . [DISCONTINUED] hydrALAZINE (APRESOLINE) 25 MG tablet Take 1 tablet (25 mg total) by mouth every 8 (eight) hours.     Allergies:   Patient has no known allergies.   Social History   Socioeconomic History  . Marital status: Divorced    Spouse name: Not on file  . Number of children: Not on file  . Years of education: Not on file  . Highest education level: Not on file  Occupational History  . Not on file  Tobacco Use  . Smoking status: Former Smoker    Packs/day: 1.00    Types: Cigarettes    Quit date: 07/20/2019    Years since quitting: 0.0  . Smokeless  tobacco: Never Used  Substance and Sexual Activity  . Alcohol use: Yes    Alcohol/week: 6.0 standard drinks    Types: 6 Cans of beer per week  . Drug use: No  . Sexual activity: Not Currently  Other Topics Concern  . Not on file  Social History Narrative   Married   Works in Audiological scientist   Social Determinants of Health   Financial Resource Strain:   . Difficulty of Paying Living Expenses: Not on file  Food Insecurity:   . Worried About Charity fundraiser in the Last Year: Not on file  . Ran Out of Food in the Last Year: Not on file  Transportation Needs:   . Lack of Transportation (Medical): Not on file  . Lack of Transportation (Non-Medical): Not on file  Physical Activity:   . Days of Exercise per Week: Not on file  . Minutes of Exercise per Session: Not on file  Stress:   . Feeling of Stress : Not on file  Social Connections:   . Frequency of Communication with Friends and Family: Not on file  . Frequency of Social Gatherings with Friends and Family: Not on file  . Attends Religious Services: Not on file  . Active Member of Clubs or Organizations: Not on file  . Attends Archivist Meetings: Not on file  . Marital Status: Not on file     Family History: [name] family history includes Colon cancer in his father; Hypertension in his mother; Stomach cancer in his brother.  ROS:   Please see the history of present illness.    All other systems reviewed and are negative.  EKGs/Labs/Other Studies Reviewed:    The following studies were reviewed today:  Echo 07/18/19  1. Left ventricular ejection fraction, by estimation, is 60 to 65%. The  left ventricle has normal function. The left ventrical has no regional  wall motion abnormalities. There is mildly increased left ventricular  hypertrophy. Left ventricular diastolic  parameters are consistent with Grade II diastolic dysfunction  (pseudonormalization). Elevated left ventricular  end-diastolic pressure.  2. Right ventricular systolic function is normal. The right ventricular  size is normal. There is mildly elevated pulmonary artery systolic  pressure. The estimated right ventricular systolic pressure is [id number] mmHg.  3. Left atrial size was mild to moderately dilated.  4. Mild mitral valve regurgitation.  5. The aortic valve is normal in structure and function. Aortic valve  regurgitation is not visualized. No aortic stenosis is present.  6. The inferior vena cava is dilated in size with <50% respiratory  variability, suggesting right atrial pressure of 15 mmHg.   EKG:  EKG is ordered today.  The ekg ordered today demonstrates sinus rhythm with HR 78, LVH, nonspecific ST and  T changes similar to prior  Recent Labs: 07/18/2019: ALT 19; B Natriuretic Peptide 2,859.6; TSH 2.630 07/21/2019: Hemoglobin 10.6; Platelets 155 07/23/2019: BUN 100; Creatinine, Ser 5.70; Magnesium 2.7; Potassium 4.2; Sodium 140  Recent Lipid Panel    Component Value Date/Time   CHOL 252 (H) 11/10/2017 0721   TRIG 197 (H) 11/10/2017 0721   HDL 46 11/10/2017 0721   CHOLHDL 5.5 11/10/2017 0721   VLDL 39 11/10/2017 0721   LDLCALC 167 (H) 11/10/2017 0721    Physical Exam:    VS:  BP (!) 194/110   Pulse 78   Ht 5\' 6"  (1.676 m)   Wt 144 lb (65.3 kg)   BMI 23.24 kg/m     Wt Readings from Last 3 Encounters:  08/10/19 144 lb (65.3 kg)  07/23/19 141 lb 11.2 oz (64.3 kg)  03/14/18 150 lb 9.6 oz (68.3 kg)     GEN: Well nourished, well developed in no acute distress HEENT: Normal NECK: No JVD; No carotid bruits LYMPHATICS: No lymphadenopathy CARDIAC: RRR, no murmurs, rubs, gallops RESPIRATORY:  Clear to auscultation without rales, wheezing or rhonchi  ABDOMEN: Soft, non-tender, non-distended MUSCULOSKELETAL:  No edema; No deformity  SKIN: Warm and dry NEUROLOGIC:  Alert and oriented x 3 PSYCHIATRIC:  Normal affect   ASSESSMENT:    1. Hypertensive urgency   2. Chronic diastolic  heart failure ([place])   3. Hx of transient ischemic attack ([name])   4. Noncompliance   5. Stage 3 chronic kidney disease, unspecified whether stage 3a or 3b CKD   6. PFO (patent foramen ovale)   7. Smoker    PLAN:    In order of problems listed above:  Hypertension Noncompliance - only taking amlodipine - thinks that 6.25 mg BID coreg and 25 mg hydralazine TID is making him diaphoretic with chest pain responsivel to nitro - he gets his medications mixed up, doesn't want to use a pill box - is not taking BP at home - had a long discussion about the consequences of uncontrolled hypertension and side effects he may have while we are titrating medications as his body gets used to a lower BP - gave BP log and instructions - gave salty six - referred to HTN clinic - he will restart hydralazine x 3 days, then add back coreg   Chronic diastolic heart failure - nephrology started 20 mg lasix BID - appears euvolemic on exam today   CKD stage III Anemia thought secondary to CKD - Hb at discharge was 13.3 (9.1) - requested records twice prior to visit, no fax received from [facility] - will defer diuretic titration to nephrology as well as serial labs to monitor renal function   PFO with IAS TEE recommended in 2019, but never completed.  Plan for OP TEE once pressure is better controlled   Ongoing tobacco use Suspect COPD - needs to stop smoking - questioned nicotine patch   Follow up in 3 months with gen cards.   Medication Adjustments/Labs and Tests Ordered: Current medicines are reviewed at length with the patient today.  Concerns regarding medicines are outlined above.  No orders of the defined types were placed in this encounter.  Meds ordered this encounter  Medications  . hydrALAZINE (APRESOLINE) 25 MG tablet    Sig: Take 1 tablet (25 mg total) by mouth every 8 (eight) hours.    Dispense:  90 tablet    Refill:  1  . carvedilol (COREG) 6.25 MG tablet    Sig:  Take 1 tablet (6.25 mg total) by mouth 2 (two) times daily with a meal.    Dispense:  60 tablet    Refill:  1    Signed, Ledora Bottcher, [place]  08/10/2019 11:31 AM    [facility]

## 2019-08-10 ENCOUNTER — Other Ambulatory Visit: Payer: Self-pay

## 2019-08-10 ENCOUNTER — Ambulatory Visit (INDEPENDENT_AMBULATORY_CARE_PROVIDER_SITE_OTHER): Payer: Medicare Other | Admitting: Physician Assistant

## 2019-08-10 ENCOUNTER — Encounter: Payer: Self-pay | Admitting: Physician Assistant

## 2019-08-10 VITALS — BP 194/110 | HR 78 | Ht 66.0 in | Wt 144.0 lb

## 2019-08-10 DIAGNOSIS — Q2112 Patent foramen ovale: Secondary | ICD-10-CM

## 2019-08-10 DIAGNOSIS — I160 Hypertensive urgency: Secondary | ICD-10-CM | POA: Diagnosis not present

## 2019-08-10 DIAGNOSIS — Z91199 Patient's noncompliance with other medical treatment and regimen due to unspecified reason: Secondary | ICD-10-CM | POA: Insufficient documentation

## 2019-08-10 DIAGNOSIS — I5032 Chronic diastolic (congestive) heart failure: Secondary | ICD-10-CM | POA: Diagnosis not present

## 2019-08-10 DIAGNOSIS — Z9119 Patient's noncompliance with other medical treatment and regimen: Secondary | ICD-10-CM | POA: Diagnosis not present

## 2019-08-10 DIAGNOSIS — Z8673 Personal history of transient ischemic attack (TIA), and cerebral infarction without residual deficits: Secondary | ICD-10-CM | POA: Diagnosis not present

## 2019-08-10 DIAGNOSIS — Q211 Atrial septal defect: Secondary | ICD-10-CM

## 2019-08-10 DIAGNOSIS — F17200 Nicotine dependence, unspecified, uncomplicated: Secondary | ICD-10-CM

## 2019-08-10 DIAGNOSIS — N183 Chronic kidney disease, stage 3 unspecified: Secondary | ICD-10-CM

## 2019-08-10 MED ORDER — CARVEDILOL 6.25 MG PO TABS: 6.2500 mg | ORAL_TABLET | Freq: Two times a day (BID) | ORAL | 1 refills | Status: DC

## 2019-08-10 MED ORDER — HYDRALAZINE HCL 25 MG PO TABS: 25.0000 mg | ORAL_TABLET | Freq: Three times a day (TID) | ORAL | 1 refills | Status: DC

## 2019-08-10 MED FILL — hydrALAZINE HCL 25 MG TABS: 25 | 30 days supply | Qty: 90 | Fill #0

## 2019-08-10 MED FILL — CARVEDILOL 6.25 MG TABLET: 6.25 | 30 days supply | Qty: 60 | Fill #0

## 2019-08-10 NOTE — Patient Instructions (Addendum)
Medication Instructions:  Your physician recommends that you continue on your current medications as directed. Please refer to the Current Medication list given to you today.  Take Hydralazine 25 mg three times daily.  After 3 days, start taking Carvedilol 6.25 mg twice daily.  *If you need a refill on your cardiac medications before your next appointment, please call your pharmacy*   Follow-Up: At [facility], you and your health needs are our priority.  As part of our continuing mission to provide you with exceptional heart care, we have created designated Provider Care Teams.  These Care Teams include your primary Cardiologist (physician) and Advanced Practice Providers (APPs -  Physician Assistants and Nurse Practitioners) who all work together to provide you with the care you need, when you need it.  We recommend signing up for the patient portal called "MyChart".  Sign up information is provided on this After Visit Summary.  MyChart is used to connect with patients for Virtual Visits (Telemedicine).  Patients are able to view lab/test results, encounter notes, upcoming appointments, etc.  Non-urgent messages can be sent to your provider as well.   To learn more about what you can do with MyChart, go to [email or web address].    Your next appointment:   3 month(s)  The format for your next appointment:   In Person  Provider:   Sanda Klein, MD   Other Instructions You have been referred to Hypertension Clinic. Your appointment is scheduled for Wednesday, 08/23/19 at 1:00 PM. Please bring your blood pressure machine and blood pressure log to this appointment.  Your physician has requested that you regularly monitor and record your blood pressure readings at home, two hours after taking your morning medications. Please use the same machine at the same time of day to check your readings and record them to bring to your follow-up visit.

## 2019-08-10 NOTE — Progress Notes (Signed)
No getting a good feeling about this. Will he take the meds?

## 2019-08-14 ENCOUNTER — Other Ambulatory Visit (INDEPENDENT_AMBULATORY_CARE_PROVIDER_SITE_OTHER): Payer: Medicare Other

## 2019-08-14 DIAGNOSIS — I5032 Chronic diastolic (congestive) heart failure: Secondary | ICD-10-CM

## 2019-08-16 MED FILL — AMOX-CLAV 500-125 MG TABLET: 500-125 | 7 days supply | Qty: 14 | Fill #0

## 2019-08-16 MED FILL — AMLODIPINE BESYLATE 10 MG T: 10 | 30 days supply | Qty: 30 | Fill #0

## 2019-08-16 MED FILL — CARVEDILOL 3.125 MG TABLET: 3.125 | 30 days supply | Qty: 60 | Fill #0

## 2019-08-23 ENCOUNTER — Telehealth: Payer: Self-pay | Admitting: *Deleted

## 2019-08-23 ENCOUNTER — Ambulatory Visit: Payer: Medicare Other | Admitting: Cardiovascular Disease

## 2019-08-23 ENCOUNTER — Encounter: Payer: Self-pay | Admitting: *Deleted

## 2019-08-23 NOTE — Telephone Encounter (Signed)
Patient called in and rescheduled his HTN clinic appointment. Patient was rescheduled on a day that Dr Oval Linsey is not doing HTN clinic. Tried to call patient, phone not working. Mychart message sent to patient

## 2019-08-30 ENCOUNTER — Ambulatory Visit (INDEPENDENT_AMBULATORY_CARE_PROVIDER_SITE_OTHER): Payer: Medicare Other | Admitting: Primary Care

## 2019-08-30 ENCOUNTER — Encounter (INDEPENDENT_AMBULATORY_CARE_PROVIDER_SITE_OTHER): Payer: Self-pay | Admitting: Primary Care

## 2019-08-30 ENCOUNTER — Other Ambulatory Visit: Payer: Self-pay

## 2019-08-30 VITALS — BP 229/121 | HR 68 | Temp 97.2°F | Ht 66.0 in | Wt 142.0 lb

## 2019-08-30 DIAGNOSIS — K0889 Other specified disorders of teeth and supporting structures: Secondary | ICD-10-CM

## 2019-08-30 DIAGNOSIS — I10 Essential (primary) hypertension: Secondary | ICD-10-CM

## 2019-08-30 MED ORDER — HYDRALAZINE HCL 50 MG PO TABS: 50.0000 mg | ORAL_TABLET | Freq: Three times a day (TID) | ORAL | 3 refills | Status: DC

## 2019-08-30 MED ORDER — AMLODIPINE BESYLATE 10 MG PO TABS: 10.0000 mg | ORAL_TABLET | Freq: Every day | ORAL | 3 refills | Status: DC

## 2019-08-30 MED ORDER — FUROSEMIDE 20 MG PO TABS: 20.0000 mg | ORAL_TABLET | Freq: Once | ORAL | 3 refills | Status: DC

## 2019-08-30 NOTE — Patient Instructions (Signed)

## 2019-08-30 NOTE — Progress Notes (Signed)
Established Patient Office Visit  Subjective:  [name], male    DOB: 09/23/1955  Age: 64 y.o. MRN: [id number]  CC:  Chief Complaint  Patient presents with  . facial abcess    left side jaw for two weeks     HPI Albert Harris is a 64 year old Caucasian male who  presents with a empty bottle for antibtibiotic from nephrologist and told writer he was sent there for a different antibiotic. Called nephrologist had writer to verify the medication he had him on for Blood pressure against our list. Call was made because Bp was 200/110. Updated medication list and told to take medication as prescribe and needs a dentist .  Past Medical History:  Diagnosis Date  . Asthma   . Complication of anesthesia    " i HAVE A HARD TIME WAKING UP "  . Hypertension   . PFO with atrial septal aneurysm    by echo 11/2017  . [name] (transient ischemic attack) 11/2017  . Tobacco abuse     Past Surgical History:  Procedure Laterality Date  . COLONOSCOPY    . FINGER SURGERY      Family History  Problem Relation Age of Onset  . Hypertension Mother   . Colon cancer Father   . Stomach cancer Brother     Social History   Socioeconomic History  . Marital status: Divorced    Spouse name: Not on file  . Number of children: Not on file  . Years of education: Not on file  . Highest education level: Not on file  Occupational History  . Not on file  Tobacco Use  . Smoking status: Former Smoker    Packs/day: 1.00    Types: Cigarettes    Quit date: 07/20/2019    Years since quitting: 0.1  . Smokeless tobacco: Never Used  Substance and Sexual Activity  . Alcohol use: Yes    Alcohol/week: 6.0 standard drinks    Types: 6 Cans of beer per week  . Drug use: No  . Sexual activity: Not Currently  Other Topics Concern  . Not on file  Social History Narrative   Married   Works in Audiological scientist   Social Determinants of Health   Financial Resource Strain:   .  Difficulty of Paying Living Expenses:   Food Insecurity:   . Worried About Charity fundraiser in the Last Year:   . Arboriculturist in the Last Year:   Transportation Needs:   . Film/video editor (Medical):   Marland Kitchen Lack of Transportation (Non-Medical):   Physical Activity:   . Days of Exercise per Week:   . Minutes of Exercise per Session:   Stress:   . Feeling of Stress :   Social Connections:   . Frequency of Communication with Friends and Family:   . Frequency of Social Gatherings with Friends and Family:   . Attends Religious Services:   . Active Member of Clubs or Organizations:   . Attends Archivist Meetings:   Marland Kitchen Marital Status:   Intimate Partner Violence:   . Fear of Current or Ex-Partner:   . Emotionally Abused:   Marland Kitchen Physically Abused:   . Sexually Abused:     Outpatient Medications Prior to Visit  Medication Sig Dispense Refill  . carvedilol (COREG) 6.25 MG tablet Take 1 tablet (6.25 mg total) by mouth 2 (two) times daily with a meal. 60 tablet  1  . Vitamin D, Ergocalciferol, (DRISDOL) 1.25 MG (50000 UNIT) CAPS capsule Take 50,000 Units by mouth every 7 (seven) days.    Marland Kitchen amLODipine (NORVASC) 5 MG tablet Take 10 mg by mouth daily.    . furosemide (LASIX) 20 MG tablet Take 20 mg by mouth once.    . hydrALAZINE (APRESOLINE) 25 MG tablet Take 1 tablet (25 mg total) by mouth every 8 (eight) hours. 90 tablet 1  . albuterol (VENTOLIN HFA) 108 (90 Base) MCG/ACT inhaler Inhale 2 puffs into the lungs every 6 (six) hours as needed for wheezing or shortness of breath. (Patient not taking: Reported on 08/10/2019) 8 g 0  . mometasone-formoterol (DULERA) 100-5 MCG/ACT AERO Inhale 2 puffs into the lungs 2 (two) times daily. (Patient not taking: Reported on 08/10/2019) 8.8 g 0  . nitroGLYCERIN (NITROSTAT) 0.4 MG SL tablet Place 1 tablet (0.4 mg total) under the tongue every 5 (five) minutes x 3 doses as needed for chest pain. (Patient not taking: Reported on 08/10/2019) 30 tablet 0   . simvastatin (ZOCOR) 20 MG tablet Take 1 tablet (20 mg total) by mouth at bedtime. (Patient not taking: Reported on 08/10/2019) 30 tablet 5  . vitamin B-12 (CYANOCOBALAMIN) 1000 MCG tablet Take 1 tablet (1,000 mcg total) by mouth daily. (Patient not taking: Reported on 08/10/2019) 30 tablet 0   No facility-administered medications prior to visit.    No Known Allergies  ROS Review of Systems  HENT: Positive for dental problem and mouth sores.   All other systems reviewed and are negative.     Objective:    Physical Exam  Constitutional: He is oriented to person, place, and time. He appears well-developed and well-nourished.  HENT:  Head: Normocephalic.  Left jaw swollen  Cardiovascular: Normal rate and regular rhythm.  Pulmonary/Chest: Effort normal and breath sounds normal.  Abdominal: Soft. Bowel sounds are normal.  Musculoskeletal:        General: Normal range of motion.     Cervical back: Neck supple.  Neurological: He is oriented to person, place, and time.  Skin: Skin is warm and dry.  Psychiatric: He has a normal mood and affect. His behavior is normal. Judgment and thought content normal.    BP (!) 229/121 (BP Location: Right Arm, Patient Position: Sitting, Cuff Size: Normal)   Pulse 68   Temp (!) 97.2 F (36.2 C) (Temporal)   Ht 5\' 6"  (1.676 m)   Wt 142 lb (64.4 kg)   SpO2 98%   BMI 22.92 kg/m  Wt Readings from Last 3 Encounters:  08/30/19 142 lb (64.4 kg)  08/10/19 144 lb (65.3 kg)  07/23/19 141 lb 11.2 oz (64.3 kg)     Health Maintenance Due  Topic Date Due  . Fecal DNA (Cologuard)  Never done  . INFLUENZA VACCINE  01/07/2019  . PNA vac Low Risk Adult (1 of 2 - PCV13) Never done    There are no preventive care reminders to display for this patient.  Lab Results  Component Value Date   TSH 2.630 07/18/2019   Lab Results  Component Value Date   WBC 13.3 (H) 07/21/2019   HGB 10.6 (L) 07/21/2019   HCT 32.3 (L) 07/21/2019   MCV 95.8 07/21/2019    PLT 155 07/21/2019   Lab Results  Component Value Date   NA 140 07/23/2019   K 4.2 07/23/2019   CO2 17 (L) 07/23/2019   GLUCOSE 83 07/23/2019   BUN 100 (H) 07/23/2019   CREATININE 5.70 (  H) 07/23/2019   BILITOT 0.9 07/18/2019   ALKPHOS 70 07/18/2019   AST 19 07/18/2019   ALT 19 07/18/2019   PROT 6.2 (L) 07/18/2019   ALBUMIN 2.8 (L) 07/20/2019   CALCIUM 8.5 (L) 07/23/2019   ANIONGAP 12 07/23/2019   GFR 58.30 (L) 01/19/2017   Lab Results  Component Value Date   CHOL 252 (H) 11/10/2017   Lab Results  Component Value Date   HDL 46 11/10/2017   Lab Results  Component Value Date   LDLCALC 167 (H) 11/10/2017   Lab Results  Component Value Date   TRIG 197 (H) 11/10/2017   Lab Results  Component Value Date   CHOLHDL 5.5 11/10/2017   Lab Results  Component Value Date   HGBA1C 5.4 11/10/2017      Assessment & Plan:  Albert Harris was seen today for facial abcess.  Diagnoses and all orders for this visit:  Pain, dental Treated with antibiotics and was inform to follow up with a dentist. He has Medicare provided information on [employer] [facility] that is income based call and make an appointment. Treatment is not staying on abt's but treating the problem.   Essential hypertension Elevated 200/100 manually tried to reach cardiologist or nephrologist. Nephrologist discussed medication he was suppose to be on which was different lasix 20 once daily not twice, hydralazine 50mg  three times a day and amlodipine 10mg  not 5mg  daily. He admitted to not taking his medication as prescribed and he was checking Bp asked to return in 2 weeks with Bp monitor to compare readings. Nephrologist and cardiologist will manage blood pressure. Following up on accuracy of Bp machine  Other orders -     hydrALAZINE (APRESOLINE) 50 MG tablet; Take 1 tablet (50 mg total) by mouth 3 (three) times daily. -     furosemide (LASIX) 20 MG tablet; Take 1 tablet (20 mg total) by mouth once for 1 dose. -      amLODipine (NORVASC) 10 MG tablet; Take 1 tablet (10 mg total) by mouth daily.   Problem List Items Addressed This Visit    None    Visit Diagnoses    Pain, dental    -  Primary   Essential hypertension       Relevant Medications   hydrALAZINE (APRESOLINE) 50 MG tablet   furosemide (LASIX) 20 MG tablet   amLODipine (NORVASC) 10 MG tablet      Meds ordered this encounter  Medications  . hydrALAZINE (APRESOLINE) 50 MG tablet    Sig: Take 1 tablet (50 mg total) by mouth 3 (three) times daily.    Dispense:  90 tablet    Refill:  3  . furosemide (LASIX) 20 MG tablet    Sig: Take 1 tablet (20 mg total) by mouth once for 1 dose.    Dispense:  30 tablet    Refill:  3  . amLODipine (NORVASC) 10 MG tablet    Sig: Take 1 tablet (10 mg total) by mouth daily.    Dispense:  30 tablet    Refill:  3    Follow-up: Return in about 2 weeks (around 09/13/2019) for bp in person with monitor.    Kerin Perna, NP

## 2019-08-31 MED FILL — hydrALAZINE HCL 50 MG TABS: 50 | 30 days supply | Qty: 90 | Fill #0

## 2019-08-31 MED FILL — FUROSEMIDE 20 MG TABS: 20 | 30 days supply | Qty: 30 | Fill #0

## 2019-09-11 NOTE — Progress Notes (Signed)
Hypertension Clinic Initial Assessment:    Date:  09/12/2019   ID:  DEANTA MINCEY, DOB September 30, 1955, MRN [id number]  PCP:  Skeet Latch, MD  Cardiologist:  Sanda Klein, MD  Nephrologist: Dr. Posey Pronto- [facility]  Referring MD: Kerin Perna, NP   CC: Hypertension  History of Present Illness:    Albert Harris is a 64 y.o. male with a hx of chronic diastolic heart failure, hypertension, hyperlipidemia, CKD III, [name], PFO, tobacco abuse, and asthma  here to establish care in the hypertension clinic.  He was admitted 08/2019 with hypertensive urgency in the setting of not being on medication over a year.  BP was 228/173.  He also hd AKI, creatinine 5.38.  He reports being told he had hypertension since he was a young man.  However he was never on any medication for it.  He was started on amlodipine, carvedilol, and hydralazine.  Since discharge he saw his PCP for a face abscess and his BP was 200/110.  They verified his medications with his nephrologist and he reported that he was not taking them as prescribed.  He notes that he feels well when he does not take his medicines.  However when he does take it he feels sluggish.  He does not have energy to do anything.  Therefore he sometimes goes without taking them.  He went for a week without taking them prior to seeing his PCP and this was the cause of his blood pressure being so elevated.  He does not get much exercise.  He likes to go walk [place] [employer].  He notices that sometimes when walking he gets pain in his R calf.  It tightens up and make it hard for him to walk.  It has been getting worse over the last couple years.  His breathing has been good.  He quit smoking while in the hospital after smoking 2 packs daily for 50 years.  He is working to get his wife to quit as well.  He denies any lower extremity edema, orthopnea, or PND.  He has no chest pain or pressure.  He notes that his diet is not the best that he has a lot of salt.   His wife mostly cooks at home.  They have started using no salt for seasoning.  His breakfast typically consist of bacon, eggs, biscuits, meat, and gravy.  Previous antihypertensives: none   Past Medical History:  Diagnosis Date  . Asthma   . Complication of anesthesia    " i HAVE A HARD TIME WAKING UP "  . Hypertension   . PFO with atrial septal aneurysm    by echo 11/2017  . [name] (transient ischemic attack) 11/2017  . Tobacco abuse     Past Surgical History:  Procedure Laterality Date  . COLONOSCOPY    . FINGER SURGERY      Current Medications: Current Meds  Medication Sig  . amLODipine (NORVASC) 10 MG tablet Take 1 tablet (10 mg total) by mouth daily.  . carvedilol (COREG) 6.25 MG tablet Take 1 tablet (6.25 mg total) by mouth 2 (two) times daily with a meal.  . furosemide (LASIX) 20 MG tablet Take 1 tablet (20 mg total) by mouth once for 1 dose.  . hydrALAZINE (APRESOLINE) 50 MG tablet Take 1 tablet (50 mg total) by mouth 3 (three) times daily.  . mometasone-formoterol (DULERA) 100-5 MCG/ACT AERO Inhale 2 puffs into the lungs 2 (two) times daily.  . nitroGLYCERIN (NITROSTAT)  0.4 MG SL tablet Place 1 tablet (0.4 mg total) under the tongue every 5 (five) minutes x 3 doses as needed for chest pain.  . simvastatin (ZOCOR) 20 MG tablet Take 1 tablet (20 mg total) by mouth at bedtime.  . vitamin B-12 (CYANOCOBALAMIN) 1000 MCG tablet Take 1 tablet (1,000 mcg total) by mouth daily.  . Vitamin D, Ergocalciferol, (DRISDOL) 1.25 MG (50000 UNIT) CAPS capsule Take 50,000 Units by mouth every 7 (seven) days.     Allergies:   Patient has no known allergies.   Social History   Socioeconomic History  . Marital status: Divorced    Spouse name: Not on file  . Number of children: Not on file  . Years of education: Not on file  . Highest education level: Not on file  Occupational History  . Not on file  Tobacco Use  . Smoking status: Former Smoker    Packs/day: 1.00    Types:  Cigarettes    Quit date: 07/20/2019    Years since quitting: 0.1  . Smokeless tobacco: Never Used  Substance and Sexual Activity  . Alcohol use: Yes    Alcohol/week: 6.0 standard drinks    Types: 6 Cans of beer per week  . Drug use: No  . Sexual activity: Not Currently  Other Topics Concern  . Not on file  Social History Narrative   Married   Works in Audiological scientist   Social Determinants of Health   Financial Resource Strain: Low Risk   . Difficulty of Paying Living Expenses: Not hard at all  Food Insecurity: No Food Insecurity  . Worried About Charity fundraiser in the Last Year: Never true  . Ran Out of Food in the Last Year: Never true  Transportation Needs: No Transportation Needs  . Lack of Transportation (Medical): No  . Lack of Transportation (Non-Medical): No  Physical Activity:   . Days of Exercise per Week:   . Minutes of Exercise per Session:   Stress: No Stress Concern Present  . Feeling of Stress : Not at all  Social Connections:   . Frequency of Communication with Friends and Family:   . Frequency of Social Gatherings with Friends and Family:   . Attends Religious Services:   . Active Member of Clubs or Organizations:   . Attends Archivist Meetings:   Marland Kitchen Marital Status:      Family History: [name] family history includes Colon cancer in his father; Hypertension in his mother; Stomach cancer in his brother.  ROS:   Please see the history of present illness.     All other systems reviewed and are negative.  EKGs/Labs/Other Studies Reviewed:    EKG:  EKG is not ordered today.  The ekg ordered 08/10/2019 demonstrates : Sinus rhythm.  Rate 78 bpm.  Left atrial enlargement.  LVH with secondary repolarization abnormalities.  Recent Labs: 07/18/2019: ALT 19; B Natriuretic Peptide 2,859.6; TSH 2.630 07/21/2019: Hemoglobin 10.6; Platelets 155 07/23/2019: BUN 100; Creatinine, Ser 5.70; Magnesium 2.7; Potassium 4.2; Sodium 140    Recent Lipid Panel    Component Value Date/Time   CHOL 252 (H) 11/10/2017 0721   TRIG 197 (H) 11/10/2017 0721   HDL 46 11/10/2017 0721   CHOLHDL 5.5 11/10/2017 0721   VLDL 39 11/10/2017 0721   LDLCALC 167 (H) 11/10/2017 0721    Physical Exam:    VS:  BP (!) 160/68   Pulse 69   Ht 5\' 7"  (1.702 m)  Wt 132 lb (59.9 kg)   SpO2 99%   BMI 20.67 kg/m  , BMI Body mass index is 20.67 kg/m. GENERAL:  Well appearing HEENT: Pupils equal round and reactive, fundi not visualized, oral mucosa unremarkable NECK:  No jugular venous distention, waveform within normal limits, carotid upstroke brisk and symmetric, bilateral carotid bruits, no thyromegaly LYMPHATICS:  No cervical adenopathy LUNGS:  Clear to auscultation bilaterally HEART:  RRR.  PMI not displaced or sustained,S1 and S2 within normal limits, no S3, no S4, no clicks, no rubs, no murmurs ABD:  Flat, positive bowel sounds normal in frequency in pitch, no bruits, no rebound, no guarding, no midline pulsatile mass, no hepatomegaly, no splenomegaly EXT: Unable to palpate DP/PT bilaterally, no edema, no cyanosis no clubbing SKIN:  No rashes no nodules  Diminished hair growth to mid tibia NEURO:  Cranial nerves II through XII grossly intact, motor grossly intact throughout PSYCH:  Cognitively intact, oriented to person place and time   ASSESSMENT:    No diagnosis found.  PLAN:    # Essential hypertension:   Blood pressure is poorly controlled.  He has never been on any medication until recently.  He remains above goal.  I suspect that his fatigue is related to the carvedilol.  We will stop carvedilol and increase hydralazine to 100 mg 3 times daily.  Continue amlodipine.  We will also add doxazosin.  He will start with 2 mg nightly and increase to 4 mg in 1 week.  We will enroll him in our advanced hypertension clinic.  He was provided with a blood pressure cuff and will start remote patient monitoring.  He will also be enrolled in  the PREP program through the [facility].  We discussed the importance of limiting sodium intake.  # Claudication:  Mr. Rumble describes claudication symptoms in his right leg.  I am unable to palpate pulses.  We will check ABIs.  # Carotid stenosis:   Mild carotid stenosis bilaterally in 2019.  We will repeat carotid Dopplers.    # Hyperlipidemia:  Check fasting lipids.  His LDL should be less than 70.  # Tobacco abuse:  Patient was congratulated on smoking cessation.   Disposition:    FU with MD/PharmD in 1 month with PharmD   Medication Adjustments/Labs and Tests Ordered: Current medicines are reviewed at length with the patient today.  Concerns regarding medicines are outlined above.  No orders of the defined types were placed in this encounter.  No orders of the defined types were placed in this encounter.    Signed, Skeet Latch, MD  09/12/2019 8:46 AM    [place]

## 2019-09-12 ENCOUNTER — Ambulatory Visit (INDEPENDENT_AMBULATORY_CARE_PROVIDER_SITE_OTHER): Payer: Medicare Other | Admitting: Cardiovascular Disease

## 2019-09-12 ENCOUNTER — Encounter: Payer: Self-pay | Admitting: Cardiovascular Disease

## 2019-09-12 ENCOUNTER — Other Ambulatory Visit: Payer: Self-pay

## 2019-09-12 ENCOUNTER — Other Ambulatory Visit (HOSPITAL_COMMUNITY): Payer: Self-pay | Admitting: Cardiovascular Disease

## 2019-09-12 VITALS — BP 160/68 | HR 69 | Ht 67.0 in | Wt 132.0 lb

## 2019-09-12 DIAGNOSIS — I739 Peripheral vascular disease, unspecified: Secondary | ICD-10-CM

## 2019-09-12 DIAGNOSIS — I6523 Occlusion and stenosis of bilateral carotid arteries: Secondary | ICD-10-CM | POA: Diagnosis not present

## 2019-09-12 DIAGNOSIS — I10 Essential (primary) hypertension: Secondary | ICD-10-CM

## 2019-09-12 DIAGNOSIS — I6529 Occlusion and stenosis of unspecified carotid artery: Secondary | ICD-10-CM | POA: Insufficient documentation

## 2019-09-12 HISTORY — DX: Occlusion and stenosis of unspecified carotid artery: I65.29

## 2019-09-12 HISTORY — DX: Peripheral vascular disease, unspecified: I73.9

## 2019-09-12 MED ORDER — HYDRALAZINE HCL 100 MG PO TABS: 100.0000 mg | ORAL_TABLET | Freq: Three times a day (TID) | ORAL | 3 refills | Status: DC

## 2019-09-12 MED ORDER — DOXAZOSIN MESYLATE 4 MG PO TABS: ORAL_TABLET | ORAL | 5 refills | Status: DC

## 2019-09-12 MED FILL — hydrALAZINE HCL 100 MG TABS: 100 | 30 days supply | Qty: 90 | Fill #0

## 2019-09-12 MED FILL — DOXAZOSIN MESYLATE 4 MG TAB: 4 | 30 days supply | Qty: 30 | Fill #0

## 2019-09-12 NOTE — Addendum Note (Signed)
Addended by: Skeet Latch C on: 09/12/2019 02:41 PM   Modules accepted: Orders

## 2019-09-12 NOTE — Telephone Encounter (Signed)
Patient seen in office today. 

## 2019-09-12 NOTE — Patient Instructions (Addendum)
Medication Instructions:  STOP CARVEDILOL   START DOXAZOSIN 2 MG DAILY  FOR ONE WEEK AND THEN INCREASE 4 MG DAILY   INCREASE YOUR HYDRALAZINE TO 100 MG THREE TIMES A DAY  TAKE 2 OF YOUR 50 MG TABLETS 3 TIMES A DAY UNTIL YOU RUN OUT    Labwork: NONE    Testing/Procedures: Your physician has requested that you have a carotid duplex. This test is an ultrasound of the carotid arteries in your neck. It looks at blood flow through these arteries that supply the brain with blood. Allow one hour for this exam. There are no restrictions or special instructions.  Your physician has requested that you have an ankle brachial index (ABI). During this test an ultrasound and blood pressure cuff are used to evaluate the arteries that supply the arms and legs with blood. Allow thirty minutes for this exam. There are no restrictions or special instructions.   Follow-Up: MAY 6 AT 1:30 WITH PHARM D   Your physician recommends that you schedule a follow-up appointment in: [place], OFFICE WILL CALL AND ARRANGE    You will receive a phone call from the PREP exercise and nutrition program to schedule an initial assessment.   Special Instructions:   MONITOR YOUR BLOOD PRESSURE TWICE A DAY, LOG IN BOOK PROVIDED. BRING BOOK TO YOUR FOLLOW UP VISIT   DASH Eating Plan DASH stands for "Dietary Approaches to Stop Hypertension." The DASH eating plan is a healthy eating plan that has been shown to reduce high blood pressure (hypertension). It may also reduce your risk for type 2 diabetes, heart disease, and stroke. The DASH eating plan may also help with weight loss. What are tips for following this plan?  General guidelines  Avoid eating more than 2,300 mg (milligrams) of salt (sodium) a day. If you have hypertension, you may need to reduce your sodium intake to 1,500 mg a day.  Limit alcohol intake to no more than 1 drink a day for nonpregnant women and 2 drinks a day for men. One drink equals  12 oz of beer, 5 oz of wine, or 1 oz of hard liquor.  Work with your health care provider to maintain a healthy body weight or to lose weight. Ask what an ideal weight is for you.  Get at least 30 minutes of exercise that causes your heart to beat faster (aerobic exercise) most days of the week. Activities may include walking, swimming, or biking.  Work with your health care provider or diet and nutrition specialist (dietitian) to adjust your eating plan to your individual calorie needs. Reading food labels   Check food labels for the amount of sodium per serving. Choose foods with less than 5 percent of the Daily Value of sodium. Generally, foods with less than 300 mg of sodium per serving fit into this eating plan.  To find whole grains, look for the word "whole" as the first word in the ingredient list. Shopping  Buy products labeled as "low-sodium" or "no salt added."  Buy fresh foods. Avoid canned foods and premade or frozen meals. Cooking  Avoid adding salt when cooking. Use salt-free seasonings or herbs instead of table salt or sea salt. Check with your health care provider or pharmacist before using salt substitutes.  Do not fry foods. Cook foods using healthy methods such as baking, boiling, grilling, and broiling instead.  Cook with heart-healthy oils, such as olive, canola, soybean, or sunflower oil. Meal planning  Eat a balanced  diet that includes: ? 5 or more servings of fruits and vegetables each day. At each meal, try to fill half of your plate with fruits and vegetables. ? Up to 6-8 servings of whole grains each day. ? Less than 6 oz of lean meat, poultry, or fish each day. A 3-oz serving of meat is about the same size as a deck of cards. One egg equals 1 oz. ? 2 servings of low-fat dairy each day. ? A serving of nuts, seeds, or beans 5 times each week. ? Heart-healthy fats. Healthy fats called Omega-3 fatty acids are found in foods such as flaxseeds and coldwater  fish, like sardines, salmon, and mackerel.  Limit how much you eat of the following: ? Canned or prepackaged foods. ? Food that is high in trans fat, such as fried foods. ? Food that is high in saturated fat, such as fatty meat. ? Sweets, desserts, sugary drinks, and other foods with added sugar. ? Full-fat dairy products.  Do not salt foods before eating.  Try to eat at least 2 vegetarian meals each week.  Eat more home-cooked food and less restaurant, buffet, and fast food.  When eating at a restaurant, ask that your food be prepared with less salt or no salt, if possible. What foods are recommended? The items listed may not be a complete list. Talk with your dietitian about what dietary choices are best for you. Grains Whole-grain or whole-wheat bread. Whole-grain or whole-wheat pasta. Brown rice. [place] Morrow. Bulgur. Whole-grain and low-sodium cereals. Pita bread. Low-fat, low-sodium crackers. Whole-wheat flour tortillas. Vegetables Fresh or frozen vegetables (raw, steamed, roasted, or grilled). Low-sodium or reduced-sodium tomato and vegetable juice. Low-sodium or reduced-sodium tomato sauce and tomato paste. Low-sodium or reduced-sodium canned vegetables. Fruits All fresh, dried, or frozen fruit. Canned fruit in natural juice (without added sugar). Meat and other protein foods Skinless chicken or [place]. Ground chicken or [place]. Pork with fat trimmed off. Fish and seafood. Egg whites. Dried beans, peas, or lentils. Unsalted nuts, nut butters, and seeds. Unsalted canned beans. Lean cuts of beef with fat trimmed off. Low-sodium, lean deli meat. Dairy Low-fat (1%) or fat-free (skim) milk. Fat-free, low-fat, or reduced-fat cheeses. Nonfat, low-sodium ricotta or cottage cheese. Low-fat or nonfat yogurt. Low-fat, low-sodium cheese. Fats and oils Soft margarine without trans fats. Vegetable oil. Low-fat, reduced-fat, or light mayonnaise and salad dressings (reduced-sodium). Canola,  safflower, olive, soybean, and sunflower oils. Avocado. Seasoning and other foods Herbs. Spices. Seasoning mixes without salt. Unsalted popcorn and pretzels. Fat-free sweets. What foods are not recommended? The items listed may not be a complete list. Talk with your dietitian about what dietary choices are best for you. Grains Baked goods made with fat, such as croissants, muffins, or some breads. Dry pasta or rice meal packs. Vegetables Creamed or fried vegetables. Vegetables in a cheese sauce. Regular canned vegetables (not low-sodium or reduced-sodium). Regular canned tomato sauce and paste (not low-sodium or reduced-sodium). Regular tomato and vegetable juice (not low-sodium or reduced-sodium). [name] Fava. Olives. Fruits Canned fruit in a light or heavy syrup. Fried fruit. Fruit in cream or butter sauce. Meat and other protein foods Fatty cuts of meat. Ribs. Fried meat. Berniece Salines. Sausage. Bologna and other processed lunch meats. Salami. Fatback. Hotdogs. Bratwurst. Salted nuts and seeds. Canned beans with added salt. Canned or smoked fish. Whole eggs or egg yolks. Chicken or [place] with skin. Dairy Whole or 2% milk, cream, and half-and-half. Whole or full-fat cream cheese. Whole-fat or sweetened yogurt. Full-fat cheese. Nondairy  creamers. Whipped toppings. Processed cheese and cheese spreads. Fats and oils Butter. Stick margarine. Lard. Shortening. Ghee. Bacon fat. Tropical oils, such as coconut, palm kernel, or palm oil. Seasoning and other foods Salted popcorn and pretzels. Onion salt, garlic salt, seasoned salt, table salt, and sea salt. Worcestershire sauce. Tartar sauce. Barbecue sauce. Teriyaki sauce. Soy sauce, including reduced-sodium. Steak sauce. Canned and packaged gravies. Fish sauce. Oyster sauce. Cocktail sauce. Horseradish that you find on the shelf. Ketchup. Mustard. Meat flavorings and tenderizers. Bouillon cubes. Hot sauce and Tabasco sauce. Premade or packaged marinades. Premade or  packaged taco seasonings. Relishes. Regular salad dressings. Where to find more information:  National Heart, Lung, and [place]: https://wilson-eaton.com/  American Heart Association: www.heart.org Summary  The DASH eating plan is a healthy eating plan that has been shown to reduce high blood pressure (hypertension). It may also reduce your risk for type 2 diabetes, heart disease, and stroke.  With the DASH eating plan, you should limit salt (sodium) intake to 2,300 mg a day. If you have hypertension, you may need to reduce your sodium intake to 1,500 mg a day.  When on the DASH eating plan, aim to eat more fresh fruits and vegetables, whole grains, lean proteins, low-fat dairy, and heart-healthy fats.  Work with your health care provider or diet and nutrition specialist (dietitian) to adjust your eating plan to your individual calorie needs. This information is not intended to replace advice given to you by your health care provider. Make sure you discuss any questions you have with your health care provider. Document Released: 05/14/2011 Document Revised: 05/07/2017 Document Reviewed: 05/18/2016 Elsevier Patient Education  2020 [employer].

## 2019-09-13 ENCOUNTER — Encounter (INDEPENDENT_AMBULATORY_CARE_PROVIDER_SITE_OTHER): Payer: Self-pay | Admitting: Primary Care

## 2019-09-13 ENCOUNTER — Ambulatory Visit (INDEPENDENT_AMBULATORY_CARE_PROVIDER_SITE_OTHER): Payer: Medicare Other | Admitting: Primary Care

## 2019-09-13 VITALS — BP 176/82 | HR 64 | Temp 97.3°F | Ht 66.0 in | Wt 146.6 lb

## 2019-09-13 DIAGNOSIS — I10 Essential (primary) hypertension: Secondary | ICD-10-CM | POA: Diagnosis not present

## 2019-09-13 DIAGNOSIS — Z1211 Encounter for screening for malignant neoplasm of colon: Secondary | ICD-10-CM | POA: Diagnosis not present

## 2019-09-13 NOTE — Patient Instructions (Signed)
Colonoscopy, Adult A colonoscopy is a procedure to look at the entire large intestine. This procedure is done using a long, thin, flexible tube that has a camera on the end. You may have a colonoscopy:  As a part of normal colorectal screening.  If you have certain symptoms, such as: ? A low number of red blood cells in your blood (anemia). ? Diarrhea that does not go away. ? Pain in your abdomen. ? Blood in your stool. A colonoscopy can help screen for and diagnose medical problems, including:  Tumors.  Extra tissue that grows where mucus forms (polyps).  Inflammation.  Areas of bleeding. Tell your health care provider about:  Any allergies you have.  All medicines you are taking, including vitamins, herbs, eye drops, creams, and over-the-counter medicines.  Any problems you or family members have had with anesthetic medicines.  Any blood disorders you have.  Any surgeries you have had.  Any medical conditions you have.  Any problems you have had with having bowel movements.  Whether you are pregnant or may be pregnant. What are the risks? Generally, this is a safe procedure. However, problems may occur, including:  Bleeding.  Damage to your intestine.  Allergic reactions to medicines given during the procedure.  Infection. This is rare. What happens before the procedure? Eating and drinking restrictions Follow instructions from your health care provider about eating or drinking restrictions, which may include:  A few days before the procedure: ? Follow a low-fiber diet. ? Avoid nuts, seeds, dried fruit, raw fruits, and vegetables.  1-3 days before the procedure: ? Eat only gelatin dessert or ice pops. ? Drink only clear liquids, such as water, clear juice, clear broth or bouillon, black coffee or tea, or clear soft drinks or sports drinks. ? Avoid liquids that contain red or purple dye.  The day of the procedure: ? Do not eat solid foods. You may  continue to drink clear liquids until up to 2 hours before the procedure. ? Do not eat or drink anything starting 2 hours before the procedure, or within the time period that your health care provider recommends. Bowel prep If you were prescribed a bowel prep to take by mouth (orally) to clean out your colon:  Take it as told by your health care provider. Starting the day before your procedure, you will need to drink a large amount of liquid medicine. The liquid will cause you to have many bowel movements of loose stool until your stool becomes almost clear or light green.  If your skin or the opening between the buttocks (anus) gets irritated from diarrhea, you may relieve the irritation using: ? Wipes with medicine in them, such as adult wet wipes with aloe and vitamin E. ? A product to soothe skin, such as petroleum jelly.  If you vomit while drinking the bowel prep: ? Take a break for up to 60 minutes. ? Begin the bowel prep again. ? Call your health care provider if you keep vomiting or you cannot take the bowel prep without vomiting.  To clean out your colon, you may also be given: ? Laxative medicines. These help you have a bowel movement. ? Instructions for enema use. An enema is liquid medicine injected into your rectum. Medicines Ask your health care provider about:  Changing or stopping your regular medicines or supplements. This is especially important if you are taking iron supplements, diabetes medicines, or blood thinners.  Taking medicines such as aspirin and ibuprofen. These medicines  can thin your blood. Do not take these medicines unless your health care provider tells you to take them.  Taking over-the-counter medicines, vitamins, herbs, and supplements. General instructions  Ask your health care provider what steps will be taken to help prevent infection. These may include washing skin with a germ-killing soap.  Plan to have someone take you home from the hospital  or clinic. What happens during the procedure?   An IV will be inserted into one of your veins.  You may be given one or more of the following: ? A medicine to help you relax (sedative). ? A medicine to numb the area (local anesthetic). ? A medicine to make you fall asleep (general anesthetic). This is rarely needed.  You will lie on your side with your knees bent.  The tube will: ? Have oil or gel put on it (be lubricated). ? Be inserted into your anus. ? Be gently eased through all parts of your large intestine.  Air will be sent into your colon to keep it open. This may cause some pressure or cramping.  Images will be taken with the camera and will appear on a screen.  A small tissue sample may be removed to be looked at under a microscope (biopsy). The tissue may be sent to a lab for testing if any signs of problems are found.  If small polyps are found, they may be removed and checked for cancer cells.  When the procedure is finished, the tube will be removed. The procedure may vary among health care providers and hospitals. What happens after the procedure?  Your blood pressure, heart rate, breathing rate, and blood oxygen level will be monitored until you leave the hospital or clinic.  You may have a small amount of blood in your stool.  You may pass gas and have mild cramping or bloating in your abdomen. This is caused by the air that was used to open your colon during the exam.  Do not drive for 24 hours after the procedure.  It is up to you to get the results of your procedure. Ask your health care provider, or the department that is doing the procedure, when your results will be ready. Summary  A colonoscopy is a procedure to look at the entire large intestine.  Follow instructions from your health care provider about eating and drinking before the procedure.  If you were prescribed an oral bowel prep to clean out your colon, take it as told by your health care  provider.  During the colonoscopy, a flexible tube with a camera on its end is inserted into the anus and then passed into the other parts of the large intestine. This information is not intended to replace advice given to you by your health care provider. Make sure you discuss any questions you have with your health care provider. Document Revised: 12/16/2018 Document Reviewed: 12/16/2018 Elsevier Patient Education  [place].

## 2019-09-13 NOTE — Progress Notes (Signed)
Established Patient Office Visit  Subjective:  [name], male    DOB: [birth year]  Age: 64 y.o. MRN: [id number]  CC:  Chief Complaint  Patient presents with  . Blood Pressure Check    HPI Mr.Venice M Marling presents for blood pressure follow up however he is being managed by cardiology and seen yesterday. He is having a health maintenance visit. Referred for a colonoscopy and decline pneumax.  Past Medical History:  Diagnosis Date  . Asthma   . Carotid stenosis 09/12/2019  . Claudication in peripheral vascular disease ([place]) 09/12/2019  . Complication of anesthesia    " i HAVE A HARD TIME WAKING UP "  . Hypertension   . PFO with atrial septal aneurysm    by echo 11/2017  . [name] (transient ischemic attack) 11/2017  . Tobacco abuse     Past Surgical History:  Procedure Laterality Date  . COLONOSCOPY    . FINGER SURGERY      Family History  Problem Relation Age of Onset  . Hypertension Mother   . Colon cancer Father   . Stomach cancer Brother     Social History   Socioeconomic History  . Marital status: Divorced    Spouse name: Not on file  . Number of children: Not on file  . Years of education: Not on file  . Highest education level: Not on file  Occupational History  . Not on file  Tobacco Use  . Smoking status: Former Smoker    Packs/day: 1.00    Types: Cigarettes    Quit date: 07/20/2019    Years since quitting: 0.1  . Smokeless tobacco: Never Used  Substance and Sexual Activity  . Alcohol use: Yes    Alcohol/week: 6.0 standard drinks    Types: 6 Cans of beer per week  . Drug use: No  . Sexual activity: Not Currently  Other Topics Concern  . Not on file  Social History Narrative   Married   Works in Audiological scientist   Social Determinants of Health   Financial Resource Strain: Low Risk   . Difficulty of Paying Living Expenses: Not hard at all  Food Insecurity: No Food Insecurity  . Worried About Charity fundraiser  in the Last Year: Never true  . Ran Out of Food in the Last Year: Never true  Transportation Needs: No Transportation Needs  . Lack of Transportation (Medical): No  . Lack of Transportation (Non-Medical): No  Physical Activity:   . Days of Exercise per Week:   . Minutes of Exercise per Session:   Stress: No Stress Concern Present  . Feeling of Stress : Not at all  Social Connections:   . Frequency of Communication with Friends and Family:   . Frequency of Social Gatherings with Friends and Family:   . Attends Religious Services:   . Active Member of Clubs or Organizations:   . Attends Archivist Meetings:   Marland Kitchen Marital Status:   Intimate Partner Violence:   . Fear of Current or Ex-Partner:   . Emotionally Abused:   Marland Kitchen Physically Abused:   . Sexually Abused:     Outpatient Medications Prior to Visit  Medication Sig Dispense Refill  . albuterol (VENTOLIN HFA) 108 (90 Base) MCG/ACT inhaler Inhale 2 puffs into the lungs every 6 (six) hours as needed for wheezing or shortness of breath. 8 g 0  . amLODipine (NORVASC) 10 MG tablet Take 1 tablet (10 mg  total) by mouth daily. 30 tablet 3  . doxazosin (CARDURA) 4 MG tablet TAKE 1/2 TABLET BY MOUTH DAILY FOR 1 WEEK THEN INCREASE TO 1 DAILY 30 tablet 5  . hydrALAZINE (APRESOLINE) 100 MG tablet Take 1 tablet (100 mg total) by mouth 3 (three) times daily. 90 tablet 3  . mometasone-formoterol (DULERA) 100-5 MCG/ACT AERO Inhale 2 puffs into the lungs 2 (two) times daily. 8.8 g 0  . nitroGLYCERIN (NITROSTAT) 0.4 MG SL tablet Place 1 tablet (0.4 mg total) under the tongue every 5 (five) minutes x 3 doses as needed for chest pain. 30 tablet 0  . simvastatin (ZOCOR) 20 MG tablet Take 1 tablet (20 mg total) by mouth at bedtime. 30 tablet 5  . vitamin B-12 (CYANOCOBALAMIN) 1000 MCG tablet Take 1 tablet (1,000 mcg total) by mouth daily. 30 tablet 0  . Vitamin D, Ergocalciferol, (DRISDOL) 1.25 MG (50000 UNIT) CAPS capsule Take 50,000 Units by mouth  every 7 (seven) days.    . furosemide (LASIX) 20 MG tablet Take 1 tablet (20 mg total) by mouth once for 1 dose. 30 tablet 3   No facility-administered medications prior to visit.    No Known Allergies  ROS Review of Systems  All other systems reviewed and are negative.     Objective:    Physical Exam  Constitutional: He is oriented to person, place, and time. He appears well-developed and well-nourished.  HENT:  Head: Normocephalic.  Cardiovascular: Normal rate and regular rhythm.  Pulmonary/Chest: Effort normal and breath sounds normal.  Abdominal: Soft. Bowel sounds are normal.  Musculoskeletal:        General: Normal range of motion.     Cervical back: Normal range of motion and neck supple.  Neurological: He is alert and oriented to person, place, and time. He has normal reflexes.  Skin: Skin is warm and dry.  Psychiatric: He has a normal mood and affect. His behavior is normal. Judgment and thought content normal.    BP (!) 176/82 (BP Location: Right Arm, Patient Position: Sitting, Cuff Size: Normal)   Pulse 64   Temp (!) 97.3 F (36.3 C) (Temporal)   Ht 5\' 6"  (1.676 m)   Wt 146 lb 9.6 oz (66.5 kg)   SpO2 96%   BMI 23.66 kg/m  Wt Readings from Last 3 Encounters:  09/13/19 146 lb 9.6 oz (66.5 kg)  09/12/19 132 lb (59.9 kg)  08/30/19 142 lb (64.4 kg)     Health Maintenance Due  Topic Date Due  . Fecal DNA (Cologuard)  Never done    There are no preventive care reminders to display for this patient.  Lab Results  Component Value Date   TSH 2.630 07/18/2019   Lab Results  Component Value Date   WBC 13.3 (H) 07/21/2019   HGB 10.6 (L) 07/21/2019   HCT 32.3 (L) 07/21/2019   MCV 95.8 07/21/2019   PLT 155 07/21/2019   Lab Results  Component Value Date   NA 140 07/23/2019   K 4.2 07/23/2019   CO2 17 (L) 07/23/2019   GLUCOSE 83 07/23/2019   BUN 100 (H) 07/23/2019   CREATININE 5.70 (H) 07/23/2019   BILITOT 0.9 07/18/2019   ALKPHOS 70 07/18/2019    AST 19 07/18/2019   ALT 19 07/18/2019   PROT 6.2 (L) 07/18/2019   ALBUMIN 2.8 (L) 07/20/2019   CALCIUM 8.5 (L) 07/23/2019   ANIONGAP 12 07/23/2019   GFR 58.30 (L) 01/19/2017   Lab Results  Component Value Date  CHOL 252 (H) 11/10/2017   Lab Results  Component Value Date   HDL 46 11/10/2017   Lab Results  Component Value Date   LDLCALC 167 (H) 11/10/2017   Lab Results  Component Value Date   TRIG 197 (H) 11/10/2017   Lab Results  Component Value Date   CHOLHDL 5.5 11/10/2017   Lab Results  Component Value Date   HGBA1C 5.4 11/10/2017      Assessment & Plan:  [place] was seen today for blood pressure check.  Diagnoses and all orders for this visit:  Screening for colon cancer Normal colon cancer screening.  CDC recommends colorectal screening from ages 15-75.  -     Ambulatory referral to Gastroenterology  Essential hypertension Elevated blood pressure states he takes it everyday since I got on him and did not want to be in trouble today. His blood pressure is being managed by cardiology. Every visit I will reinforce medication compliance.   No orders of the defined types were placed in this encounter.   Follow-up: No follow-ups on file.    Kerin Perna, NP

## 2019-09-14 ENCOUNTER — Encounter: Payer: Self-pay | Admitting: Primary Care

## 2019-09-20 ENCOUNTER — Telehealth: Payer: Self-pay | Admitting: Cardiovascular Disease

## 2019-09-20 MED FILL — AMLODIPINE BESYLATE 10 MG T: 10 | 30 days supply | Qty: 30 | Fill #1

## 2019-09-20 MED FILL — FUROSEMIDE 20 MG TABS: 20 | 30 days supply | Qty: 60 | Fill #1

## 2019-09-20 NOTE — Telephone Encounter (Signed)
Blood pressure reviewed in [place].  Recommend increasing doxazosin to 4mg  if he hasn't already done so.

## 2019-09-21 NOTE — Telephone Encounter (Signed)
Left message to call back  

## 2019-09-22 ENCOUNTER — Telehealth: Payer: Self-pay

## 2019-09-22 ENCOUNTER — Ambulatory Visit (HOSPITAL_COMMUNITY)
Admission: RE | Admit: 2019-09-22 | Discharge: 2019-09-22 | Disposition: A | Discharge status: Home / Self Care | Payer: Medicare Other | Source: Ambulatory Visit | Attending: Cardiovascular Disease | Admitting: Cardiovascular Disease

## 2019-09-22 ENCOUNTER — Other Ambulatory Visit: Payer: Self-pay

## 2019-09-22 ENCOUNTER — Ambulatory Visit (HOSPITAL_BASED_OUTPATIENT_CLINIC_OR_DEPARTMENT_OTHER)
Admission: RE | Admit: 2019-09-22 | Discharge: 2019-09-22 | Disposition: A | Discharge status: Home / Self Care | Payer: Medicare Other | Source: Ambulatory Visit | Attending: Cardiovascular Disease | Admitting: Cardiovascular Disease

## 2019-09-22 ENCOUNTER — Other Ambulatory Visit: Payer: Self-pay | Admitting: Cardiovascular Disease

## 2019-09-22 DIAGNOSIS — I739 Peripheral vascular disease, unspecified: Secondary | ICD-10-CM | POA: Diagnosis not present

## 2019-09-22 DIAGNOSIS — I6523 Occlusion and stenosis of bilateral carotid arteries: Secondary | ICD-10-CM | POA: Insufficient documentation

## 2019-09-22 MED ORDER — DOXAZOSIN MESYLATE 4 MG PO TABS: 6.0000 mg | ORAL_TABLET | Freq: Every day | ORAL | 1 refills | Status: DC

## 2019-09-22 MED FILL — DOXAZOSIN MESYLATE 4 MG TAB: 4 | 30 days supply | Qty: 45 | Fill #0

## 2019-09-22 NOTE — Telephone Encounter (Signed)
Call pt reference referral to PREP. Interested but needs to think about it before deciding. Confirmed patient has number for call back.

## 2019-09-22 NOTE — Telephone Encounter (Signed)
Patient reports has been taking doxazosin 4 mg daily at bedtime for about 2 weeks.  BP this am was 156/76.  Asked patient to increase dose to 6 mg daily.  Will send new rx to reflect this.  Patient reports had to get a new phone this week.  Has not been able to transfer the [place] app.  He was given the number for Help Desk. Also advised that if he had trouble to let us know this afternoon.  He will be in the office for vascular studies.  If still having problems, we can reach out to help desk with patient to work out issues.  Patient voiced understanding

## 2019-09-22 NOTE — Addendum Note (Signed)
Addended by: Rockne Menghini on: 09/22/2019 08:53 AM   Modules accepted: Orders

## 2019-09-29 ENCOUNTER — Other Ambulatory Visit: Payer: Self-pay

## 2019-09-29 ENCOUNTER — Ambulatory Visit (INDEPENDENT_AMBULATORY_CARE_PROVIDER_SITE_OTHER): Payer: Medicare Other | Admitting: Cardiovascular Disease

## 2019-09-29 ENCOUNTER — Encounter: Payer: Self-pay | Admitting: Cardiovascular Disease

## 2019-09-29 ENCOUNTER — Telehealth: Payer: Self-pay | Admitting: Cardiovascular Disease

## 2019-09-29 VITALS — BP 140/78 | HR 78 | Ht 67.0 in | Wt 151.0 lb

## 2019-09-29 DIAGNOSIS — I739 Peripheral vascular disease, unspecified: Secondary | ICD-10-CM | POA: Diagnosis not present

## 2019-09-29 DIAGNOSIS — I6523 Occlusion and stenosis of bilateral carotid arteries: Secondary | ICD-10-CM

## 2019-09-29 MED ORDER — CILOSTAZOL 50 MG PO TABS: 50.0000 mg | ORAL_TABLET | Freq: Two times a day (BID) | ORAL | 3 refills | Status: DC

## 2019-09-29 MED FILL — CILOSTAZOL 50 MG TABLET: 50 | 30 days supply | Qty: 60 | Fill #0

## 2019-09-29 NOTE — Assessment & Plan Note (Signed)
Recent carotid Dopplers performed 09/22/2019 revealed moderate bilateral ICA stenosis.  This will be repeated on annual basis.  The patient is neurologically asymptomatic.

## 2019-09-29 NOTE — Assessment & Plan Note (Signed)
Albert Harris was referred to me by Dr. Oval Linsey for lifestyle limiting claudication.  He has a history of tobacco abuse recently discontinued, treated hypertension hyperlipidemia.  He has chronic renal insufficiency with serum creatinines in the 6 range.  He has less lifestyle limiting claudication on the right and Doppler studies performed 09/22/2019 revealing a right ABI of 0.36 and a left of 0.44.  He has monophasic iliac waveforms, occluded SFAs bilaterally.  I can palpate femoral pulses.  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  We will postpone any invasive approach until after he is placed on hemodialysis.  I am going to put him on Pletal 50 mg p.o. twice daily since he has rest pain as well.

## 2019-09-29 NOTE — Telephone Encounter (Signed)
Patient was contacted to remind him to track his BP in [place].  No BP recorded since doxazosin was increased 4/14.

## 2019-09-29 NOTE — Patient Instructions (Addendum)
Medication Instructions:  START Pletal 50 mg two times daily  *If you need a refill on your cardiac medications before your next appointment, please call your pharmacy  Testing/Procedures: Your physician has requested that you have a lower extremity arterial duplex in 1 YEAR. This test is an ultrasound of the arteries in the legs. It looks at arterial blood flow in the legs. Allow one hour for Lower Arterial scans. There are no restrictions or special instructions  Your physician has requested that you have a carotid duplex in 1 YEAR. This test is an ultrasound of the carotid arteries in your neck. It looks at blood flow through these arteries that supply the brain with blood. Allow one hour for this exam. There are no restrictions or special instructions.   Follow-Up: At [facility], you and your health needs are our priority.  As part of our continuing mission to provide you with exceptional heart care, we have created designated Provider Care Teams.  These Care Teams include your primary Cardiologist (physician) and Advanced Practice Providers (APPs -  Physician Assistants and Nurse Practitioners) who all work together to provide you with the care you need, when you need it.  We recommend signing up for the patient portal called "MyChart".  Sign up information is provided on this After Visit Summary.  MyChart is used to connect with patients for Virtual Visits (Telemedicine).  Patients are able to view lab/test results, encounter notes, upcoming appointments, etc.  Non-urgent messages can be sent to your provider as well.   To learn more about what you can do with MyChart, go to [email or web address].    Your next appointment:   12 month(s) (after dopplers)  The format for your next appointment:   In Person  Provider:   Quay Burow, MD

## 2019-09-29 NOTE — Progress Notes (Signed)
09/29/2019 Albert Harris   06-Dec-1955  [id number]  Primary Physician Albert Perna, NP Primary Cardiologist: Albert Harp MD Albert Harris, Albert Harris, Albert Harris  HPI:  Albert Harris is a 64 y.o. thin appearing married Caucasian male father of 80, grandfather of 6 grandchildren referred to me by Albert Harris for peripheral vascular evaluation because of lifestyle limiting right calf claudication.  He is retired from being a Passenger transport manager.  He has a history of 75-pack-year tobacco abuse having quit 07/17/2019.  He also history of hypertension hyperlipidemia.  There is no family history for heart disease.  Is never had a heart attack but has had TIAs.  He denies chest pain or shortness of breath.  He has had lower extremity arterial Doppler studies performed 09/22/2019 revealing a right ABI 0.36 and a left of 0.44 with monophasic iliac waveforms, and occluded SFAs bilaterally.  He also has moderate bilateral ICA stenosis by duplex ultrasound at the same time.  He is neurologically asymptomatic.  Unfortunately, he does have stage V CKD with serum creatinines in the 6 range precluding angiography and interventional treatment at this time.   Current Meds  Medication Sig  . amLODipine (NORVASC) 10 MG tablet Take 1 tablet (10 mg total) by mouth daily.  Marland Kitchen doxazosin (CARDURA) 4 MG tablet Take 1.5 tablets (6 mg total) by mouth daily.  . furosemide (LASIX) 20 MG tablet Take 1 tablet (20 mg total) by mouth once for 1 dose.  . hydrALAZINE (APRESOLINE) 100 MG tablet Take 1 tablet (100 mg total) by mouth 3 (three) times daily.  . nitroGLYCERIN (NITROSTAT) 0.4 MG SL tablet Place 1 tablet (0.4 mg total) under the tongue every 5 (five) minutes x 3 doses as needed for chest pain.  . simvastatin (ZOCOR) 20 MG tablet Take 1 tablet (20 mg total) by mouth at bedtime.  . vitamin B-12 (CYANOCOBALAMIN) 1000 MCG tablet Take 1 tablet (1,000 mcg total) by mouth daily.  . Vitamin D, Ergocalciferol, (DRISDOL) 1.25 MG (50000  UNIT) CAPS capsule Take 50,000 Units by mouth every 7 (seven) days.     No Known Allergies  Social History   Socioeconomic History  . Marital status: Divorced    Spouse name: Not on file  . Number of children: Not on file  . Years of education: Not on file  . Highest education level: Not on file  Occupational History  . Not on file  Tobacco Use  . Smoking status: Former Smoker    Packs/day: 1.00    Types: Cigarettes    Quit date: 07/20/2019    Years since quitting: 0.1  . Smokeless tobacco: Never Used  Substance and Sexual Activity  . Alcohol use: Yes    Alcohol/week: 6.0 standard drinks    Types: 6 Cans of beer per week  . Drug use: No  . Sexual activity: Not Currently  Other Topics Concern  . Not on file  Social History Narrative   Married   Works in Audiological scientist   Social Determinants of Health   Financial Resource Strain: Low Risk   . Difficulty of Paying Living Expenses: Not hard at all  Food Insecurity: No Food Insecurity  . Worried About Charity fundraiser in the Last Year: Never true  . Ran Out of Food in the Last Year: Never true  Transportation Needs: No Transportation Needs  . Lack of Transportation (Medical): No  . Lack of Transportation (Non-Medical): No  Physical Activity:   .  Days of Exercise per Week:   . Minutes of Exercise per Session:   Stress: No Stress Concern Present  . Feeling of Stress : Not at all  Social Connections:   . Frequency of Communication with Friends and Family:   . Frequency of Social Gatherings with Friends and Family:   . Attends Religious Services:   . Active Member of Clubs or Organizations:   . Attends Archivist Meetings:   Marland Kitchen Marital Status:   Intimate Partner Violence:   . Fear of Current or Ex-Partner:   . Emotionally Abused:   Marland Kitchen Physically Abused:   . Sexually Abused:      Review of Systems: General: negative for chills, fever, night sweats or weight changes.  Cardiovascular:  negative for chest pain, dyspnea on exertion, edema, orthopnea, palpitations, paroxysmal nocturnal dyspnea or shortness of breath Dermatological: negative for rash Respiratory: negative for cough or wheezing Urologic: negative for hematuria Abdominal: negative for nausea, vomiting, diarrhea, bright red blood per rectum, melena, or hematemesis Neurologic: negative for visual changes, syncope, or dizziness All other systems reviewed and are otherwise negative except as noted above.    Blood pressure 140/78, pulse 78, height 5\' 7"  (1.702 m), weight 151 lb (68.5 kg), SpO2 98 %.  General appearance: alert and no distress Neck: no adenopathy, no JVD, supple, symmetrical, trachea midline, thyroid not enlarged, symmetric, no tenderness/mass/nodules and Soft bilateral carotid bruits Lungs: clear to auscultation bilaterally Heart: Soft outflow tract murmur Extremities: extremities normal, atraumatic, no cyanosis or edema Pulses: Absent pedal pulses Skin: Skin color, texture, turgor normal. No rashes or lesions Neurologic: Alert and oriented X 3, normal strength and tone. Normal symmetric reflexes. Normal coordination and gait  EKG not performed today  ASSESSMENT AND PLAN:   Claudication in peripheral vascular disease [facility]) Albert Harris was referred to me by Albert Harris for lifestyle limiting claudication.  He has a history of tobacco abuse recently discontinued, treated hypertension hyperlipidemia.  He has chronic renal insufficiency with serum creatinines in the 6 range.  He has less lifestyle limiting claudication on the right and Doppler studies performed 09/22/2019 revealing a right ABI of 0.36 and a left of 0.44.  He has monophasic iliac waveforms, occluded SFAs bilaterally.  I can palpate femoral pulses.  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  We will postpone any invasive approach until  after he is placed on hemodialysis.  I am going to put him on Pletal 50 mg p.o. twice daily since he has rest pain as well.  Carotid stenosis Recent carotid Dopplers performed 09/22/2019 revealed moderate bilateral ICA stenosis.  This will be repeated on annual basis.  The patient is neurologically asymptomatic.      Albert Harp MD FACP,FACC,FAHA, [facility] 09/29/2019 10:17 AM

## 2019-09-29 NOTE — Progress Notes (Signed)
Thanks

## 2019-10-12 ENCOUNTER — Ambulatory Visit (INDEPENDENT_AMBULATORY_CARE_PROVIDER_SITE_OTHER): Payer: Medicare Other | Admitting: Pharmacist

## 2019-10-12 ENCOUNTER — Other Ambulatory Visit: Payer: Self-pay

## 2019-10-12 VITALS — BP 144/68 | HR 70 | Temp 98.2°F | Ht 67.0 in | Wt 149.8 lb

## 2019-10-12 DIAGNOSIS — I160 Hypertensive urgency: Secondary | ICD-10-CM

## 2019-10-12 DIAGNOSIS — I6523 Occlusion and stenosis of bilateral carotid arteries: Secondary | ICD-10-CM

## 2019-10-12 MED ORDER — DOXAZOSIN MESYLATE 8 MG PO TABS: 8.0000 mg | ORAL_TABLET | Freq: Every day | ORAL | 1 refills | Status: DC

## 2019-10-12 MED FILL — DOXAZOSIN MESYLATE 8 MG TAB: 8 | 30 days supply | Qty: 30 | Fill #0

## 2019-10-12 NOTE — Progress Notes (Signed)
[name]                 DOB: July 17, 1955                      MRN: [id number]      HPI: Albert Harris is a 64 y.o. male referred by Dr. Oval Linsey to HTN clinic. PMH includes hypertension, [name], HF, PFO, carotid stenosis, renal stenosis, CKD stage 5, claudication, and tobacco abuse. Per Dr Kennon Holter assessment patient needs angiography, but it will be postponed until able to start hemodialysis. Albert Harris is part of the [facility] ,but has problems connecting his current phone to Viv[employer] BP cuff. He doesn't know his phone number and can not download the app either. Otherwise, patient is feeling well and is mainly concern about if renal issues.   Current HTN meds:  Amlodipine 10mg  daily Doxazosin 6mg  qHS Hydralazine 100mg  TID   BP goal: 130/80  Family History: family history includes Colon cancer in his father; Hypertension in his mother; Stomach cancer in his brother.  Social History: quit smoking in feb/2021, occasional beer   Diet: only 2 cups of coffee, struggles to decrease sodium in diet , drinks ginger ale and sweet tea every day, drop gravy from diet and is eating less bacon, using NO SALT on his meals  Exercise: activities of daily living but limited d/t claudication  Home BP readings: 638-756 systolic per patient's hx (no records)   Wt Readings from Last 3 Encounters:  10/12/19 149 lb 12.8 oz (67.9 kg)  09/29/19 151 lb (68.5 kg)  09/13/19 146 lb 9.6 oz (66.5 kg)   BP Readings from Last 3 Encounters:  10/12/19 (!) 144/68  09/29/19 140/78  09/13/19 (!) 176/82   Pulse Readings from Last 3 Encounters:  10/12/19 70  09/29/19 78  09/13/19 64    Past Medical History:  Diagnosis Date  . Asthma   . Carotid stenosis 09/12/2019  . Claudication in peripheral vascular disease ([place]) 09/12/2019  . Complication of anesthesia    " i HAVE A HARD TIME WAKING UP "  . Hypertension   . PFO with atrial septal aneurysm    by echo 11/2017  . [name] (transient ischemic  attack) 11/2017  . Tobacco abuse     Current Outpatient Medications on File Prior to Visit  Medication Sig Dispense Refill  . amLODipine (NORVASC) 10 MG tablet Take 1 tablet (10 mg total) by mouth daily. 30 tablet 3  . cilostazol (PLETAL) 50 MG tablet Take 1 tablet (50 mg total) by mouth 2 (two) times daily. 180 tablet 3  . furosemide (LASIX) 20 MG tablet Take 20 mg by mouth 2 (two) times daily.    . hydrALAZINE (APRESOLINE) 100 MG tablet Take 1 tablet (100 mg total) by mouth 3 (three) times daily. 90 tablet 3  . vitamin B-12 (CYANOCOBALAMIN) 1000 MCG tablet Take 1 tablet (1,000 mcg total) by mouth daily. 30 tablet 0  . Vitamin D, Ergocalciferol, (DRISDOL) 1.25 MG (50000 UNIT) CAPS capsule Take 50,000 Units by mouth every 7 (seven) days.    . nitroGLYCERIN (NITROSTAT) 0.4 MG SL tablet Place 1 tablet (0.4 mg total) under the tongue every 5 (five) minutes x 3 doses as needed for chest pain. (Patient not taking: Reported on 10/12/2019) 30 tablet 0   No current facility-administered medications on file prior to visit.    No Known Allergies  Blood pressure (!) 144/68, pulse 70, temperature 98.2 F (  36.8 C), height 5\' 7"  (1.702 m), weight 149 lb 12.8 oz (67.9 kg), SpO2 98 %.  Hypertensive urgency Blood pressure remains above goal but greatly improved in the last 6 weeks. Patient continues to work on positive lifestyle modifications as well. I instructed him to STOP using "No SALT" seasoning d/t CKD % and high potassium content. He will continue to avoid high sodium food, canned product, and preserved meats. I gave him his phone phone number and was able to download the app for [place].  Will increase doxazosin to 8mg  and follow up in 4 weeks. Plan to repeat BMET during next OV to re-assess renal function and electrolyte status.    Yoali Conry Rodriguez-Guzman PharmD, BCPS, CPP [place] [address] [place] [address] 10/19/2019 10:02 AM

## 2019-10-12 NOTE — Patient Instructions (Addendum)
Return for a  follow up appointment in  4 weeks  Check your blood pressure at home daily (if able) and keep record of the readings.  Take your BP meds as follows: *STOP taking "NO SALT" *Continue proper hydration - water and decaffeinated drinks *INCREASE doxazosin to 8mg  daily* Okay to take 2 tablets of 4mg  until all gone  Vivify PIN [id number] to link cuff to phone  Bring all of your meds, your BP cuff and your record of home blood pressures to your next appointment.  Exercise as you're able, try to walk approximately 30 minutes per day.  Keep salt intake to a minimum, especially watch canned and prepared boxed foods.  Eat more fresh fruits and vegetables and fewer canned items.  Avoid eating in fast food restaurants.    HOW TO TAKE YOUR BLOOD PRESSURE: . Rest 5 minutes before taking your blood pressure. .  Don't smoke or drink caffeinated beverages for at least 30 minutes before. . Take your blood pressure before (not after) you eat. . Sit comfortably with your back supported and both feet on the floor (don't cross your legs). . Elevate your arm to heart level on a table or a desk. . Use the proper sized cuff. It should fit smoothly and snugly around your bare upper arm. There should be enough room to slip a fingertip under the cuff. The bottom edge of the cuff should be 1 inch above the crease of the elbow. . Ideally, take 3 measurements at one sitting and record the average.

## 2019-10-13 ENCOUNTER — Telehealth: Payer: Self-pay | Admitting: *Deleted

## 2019-10-13 NOTE — Telephone Encounter (Signed)
Spoke with Raquel Pharm D regarding patients follow up visits. Will cancel visit with Dr Sallyanne Kuster in June and keep pharm D visit Left message to call back

## 2019-10-19 ENCOUNTER — Encounter: Payer: Self-pay | Admitting: Pharmacist

## 2019-10-19 NOTE — Assessment & Plan Note (Signed)
Blood pressure remains above goal but greatly improved in the last 6 weeks. Patient continues to work on positive lifestyle modifications as well. I instructed him to STOP using "No SALT" seasoning d/t CKD % and high potassium content. He will continue to avoid high sodium food, canned product, and preserved meats. I gave him his phone phone number and was able to download the app for [place].  Will increase doxazosin to 8mg  and follow up in 4 weeks. Plan to repeat BMET during next OV to re-assess renal function and electrolyte status.

## 2019-10-25 MED FILL — hydrALAZINE HCL 100 MG TABS: 100 | 30 days supply | Qty: 60 | Fill #0

## 2019-10-25 NOTE — Telephone Encounter (Signed)
Left message to call back ? If he has tried using Vivify home blood pressure monitoring and discuss Dr Sallyanne Kuster appointment

## 2019-10-31 NOTE — Progress Notes (Signed)
No call back yet regarding PREP Class starting on 11/14/2019 Will keep pt on list for future classes and retry then

## 2019-11-08 MED FILL — AMLODIPINE BESYLATE 10 MG T: 10 | 30 days supply | Qty: 30 | Fill #2

## 2019-11-08 MED FILL — FUROSEMIDE 20 MG TABS: 20 | 30 days supply | Qty: 60 | Fill #2

## 2019-11-08 MED FILL — VIT D2 1.25 MG (50,000 UNIT: 1.25 MG | 84 days supply | Qty: 12 | Fill #1

## 2019-11-09 NOTE — Discharge Instructions (Signed)

## 2019-11-10 ENCOUNTER — Inpatient Hospital Stay (HOSPITAL_COMMUNITY)
Admission: RE | Admit: 2019-11-10 | Discharge: 2019-11-10 | Disposition: A | Discharge status: Home / Self Care | Payer: Medicare Other | Source: Ambulatory Visit | Attending: Nephrology | Admitting: Nephrology

## 2019-11-10 ENCOUNTER — Encounter (HOSPITAL_COMMUNITY): Payer: Self-pay

## 2019-11-14 ENCOUNTER — Ambulatory Visit: Payer: Medicare Other

## 2019-11-14 NOTE — Progress Notes (Deleted)
Patient ID: XYON LUKASIK                 DOB: May 02, 1956                      MRN: [id number]      HPI: Albert Harris is a 64 y.o. male referred by Dr. Oval Linsey to HTN clinic. PMH includes hypertension, [name], HF, PFO, carotid stenosis, renal stenosis, CKD stage 5, claudication, and tobacco abuse. Per Dr Kennon Holter assessment patient needs angiography, but it will be postponed until able to start hemodialysis. Albert Harris is part of [facility] ,but has problems connecting his current phone to [employer] BP cuff. He doesn't know his phone number and can not download the app either. Otherwise, patient is feeling well and is mainly concern about if renal issues.   Since his last visit on May 6 he has only recorded  Current HTN meds:  Amlodipine 10mg  daily Doxazosin 6mg  qHS Hydralazine 100mg  TID   BP goal: 130/80  Family History: family history includes Colon cancer in his father; Hypertension in his mother; Stomach cancer in his brother.  Social History: quit smoking in feb/2021, occasional beer   Diet: only 2 cups of coffee, struggles to decrease sodium in diet , drinks ginger ale and sweet tea every day, drop gravy from diet and is eating less bacon, using NO SALT on his meals  Exercise: activities of daily living but limited d/t claudication  Home BP readings: 621-308 systolic per patient's hx (no records)   Wt Readings from Last 3 Encounters:  10/12/19 149 lb 12.8 oz (67.9 kg)  09/29/19 151 lb (68.5 kg)  09/13/19 146 lb 9.6 oz (66.5 kg)   BP Readings from Last 3 Encounters:  10/12/19 (!) 144/68  09/29/19 140/78  09/13/19 (!) 176/82   Pulse Readings from Last 3 Encounters:  10/12/19 70  09/29/19 78  09/13/19 64    Past Medical History:  Diagnosis Date  . Asthma   . Carotid stenosis 09/12/2019  . Claudication in peripheral vascular disease ([place]) 09/12/2019  . Complication of anesthesia    " i HAVE A HARD TIME WAKING UP "  . Hypertension   . PFO with atrial septal  aneurysm    by echo 11/2017  . [name] (transient ischemic attack) 11/2017  . Tobacco abuse     Current Outpatient Medications on File Prior to Visit  Medication Sig Dispense Refill  . amLODipine (NORVASC) 10 MG tablet Take 1 tablet (10 mg total) by mouth daily. 30 tablet 3  . cilostazol (PLETAL) 50 MG tablet Take 1 tablet (50 mg total) by mouth 2 (two) times daily. 180 tablet 3  . doxazosin (CARDURA) 8 MG tablet Take 1 tablet (8 mg total) by mouth daily. 90 tablet 1  . furosemide (LASIX) 20 MG tablet Take 20 mg by mouth 2 (two) times daily.    . hydrALAZINE (APRESOLINE) 100 MG tablet Take 1 tablet (100 mg total) by mouth 3 (three) times daily. 90 tablet 3  . nitroGLYCERIN (NITROSTAT) 0.4 MG SL tablet Place 1 tablet (0.4 mg total) under the tongue every 5 (five) minutes x 3 doses as needed for chest pain. (Patient not taking: Reported on 10/12/2019) 30 tablet 0  . vitamin B-12 (CYANOCOBALAMIN) 1000 MCG tablet Take 1 tablet (1,000 mcg total) by mouth daily. 30 tablet 0  . Vitamin D, Ergocalciferol, (DRISDOL) 1.25 MG (50000 UNIT) CAPS capsule Take 50,000 Units by mouth every 7 (  seven) days.     No current facility-administered medications on file prior to visit.    No Known Allergies  There were no vitals taken for this visit.  No problem-specific Assessment & Plan notes found for this encounter.   Raquel Rodriguez-Guzman PharmD, BCPS, [place] [address] 11/14/2019 8:08 AM

## 2019-11-15 ENCOUNTER — Encounter: Payer: Self-pay | Admitting: Cardiovascular Disease

## 2019-11-15 ENCOUNTER — Ambulatory Visit (INDEPENDENT_AMBULATORY_CARE_PROVIDER_SITE_OTHER): Payer: Medicare Other | Admitting: Cardiovascular Disease

## 2019-11-15 ENCOUNTER — Other Ambulatory Visit: Payer: Self-pay

## 2019-11-15 VITALS — BP 162/80 | HR 81 | Ht 67.0 in | Wt 149.2 lb

## 2019-11-15 DIAGNOSIS — I10 Essential (primary) hypertension: Secondary | ICD-10-CM

## 2019-11-15 DIAGNOSIS — I739 Peripheral vascular disease, unspecified: Secondary | ICD-10-CM | POA: Diagnosis not present

## 2019-11-15 DIAGNOSIS — Q211 Atrial septal defect: Secondary | ICD-10-CM

## 2019-11-15 DIAGNOSIS — N185 Chronic kidney disease, stage 5: Secondary | ICD-10-CM

## 2019-11-15 DIAGNOSIS — Z79899 Other long term (current) drug therapy: Secondary | ICD-10-CM

## 2019-11-15 DIAGNOSIS — Q2112 Patent foramen ovale: Secondary | ICD-10-CM

## 2019-11-15 DIAGNOSIS — I6523 Occlusion and stenosis of bilateral carotid arteries: Secondary | ICD-10-CM

## 2019-11-15 DIAGNOSIS — E7800 Pure hypercholesterolemia, unspecified: Secondary | ICD-10-CM

## 2019-11-15 MED ORDER — ISOSORBIDE MONONITRATE ER 30 MG PO TB24: 30.0000 mg | ORAL_TABLET | Freq: Every day | ORAL | 3 refills | Status: DC

## 2019-11-15 MED ORDER — ATORVASTATIN CALCIUM 80 MG PO TABS: 80.0000 mg | ORAL_TABLET | Freq: Every day | ORAL | 3 refills | Status: DC

## 2019-11-15 NOTE — Patient Instructions (Addendum)
Medication Instructions:  START- Isosorbide(Imdur) 30 mg by mouth daily START- Atorvastatin 80 mg by mouth daily START- Aspirin 81 mg by mouth daily  *If you need a refill on your cardiac medications before your next appointment, please call your pharmacy*   Lab Work: Fasting Lipid  If you have labs (blood work) drawn today and your tests are completely normal, you will receive your results only by: [place] MyChart Message (if you have MyChart) OR . A paper copy in the mail If you have any lab test that is abnormal or we need to change your treatment, we will call you to review the results.   Testing/Procedures: None Ordered   Follow-Up: At [facility], you and your health needs are our priority.  As part of our continuing mission to provide you with exceptional heart care, we have created designated Provider Care Teams.  These Care Teams include your primary Cardiologist (physician) and Advanced Practice Providers (APPs -  Physician Assistants and Nurse Practitioners) who all work together to provide you with the care you need, when you need it.  We recommend signing up for the patient portal called "MyChart".  Sign up information is provided on this After Visit Summary.  MyChart is used to connect with patients for Virtual Visits (Telemedicine).  Patients are able to view lab/test results, encounter notes, upcoming appointments, etc.  Non-urgent messages can be sent to your provider as well.   To learn more about what you can do with MyChart, go to [email or web address].    Your next appointment:   3 month(s) Thursday September 9th @ 3:20 pm  The format for your next appointment:   In Person  Provider:   You may see Sanda Klein, MD or one of the following Advanced Practice Providers on your designated Care Team:    Almyra Deforest, PA-C  Fabian Sharp, [place] or   Roby Lofts, [place]    OTHER:  Monday July 12 th @ 2:45 pm with Jory Sims, DNP

## 2019-11-15 NOTE — Progress Notes (Signed)
Cardiology Office Note:    Date:  11/17/2019   ID:  Albert Harris, DOB March 21, 1956, MRN [id number]  PCP:  Kerin Perna, NP  [facility] Cardiologist:  Albert Klein, MD  [facility] Electrophysiologist:  None   Referring MD: Kerin Perna, NP   Chief Complaint  Patient presents with   PAD    History of Present Illness:    Albert Harris is a 64 y.o. male with a hx of PAD involving the lower extremities and the carotid arteries, previous heavy smoker (quit Feb 2021) with hypertension and hyperlipidemia and progressive chronic kidney disease, now stage V.  He has very predictable claudication after walking roughly 100 feet (200 feet if he walks slowly.  This is promptly relieved by rest and recurs with similar distance of walking.  He denies angina or dyspnea either at rest or with activity.  He has not had dizziness or syncope.  I am not sure why, but he is no longer receiving a statin.  His blood pressure is not well controlled.  His home sphygmomanometer does underestimate his blood pressure by about 20 mmHg.  He describes that his right foot is painful and goes numb at night he also has pain shooting down the back of his left leg.  He has bilateral 40-59% stenosis in the right and left internal carotid arteries and has a greater than 50% stenosis in the right external carotid artery, where he has his loudest bruit.  He has not had a stroke, but had symptoms suspicious of a left hemispheric [name] in June 2019.  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  ABI was 0.36 on the right and 0.44 on the left in April 2021.  He reports being referred to discuss dialysis access surgical implantation, but this has not yet been scheduled as far as I can tell.  For the same reason, angiographic evaluation for his PAD has been delayed.  Recent creatinine has been in the 4.1-5.7 range.  His nephrologist is Dr.  Posey Pronto.    He has preserved left ventricular systolic function.  Past Medical History:  Diagnosis Date   Asthma    Carotid stenosis 09/12/2019   Claudication in peripheral vascular disease ([place]) 10/13/8467   Complication of anesthesia    " i HAVE A HARD TIME WAKING UP "   Hypertension    PFO with atrial septal aneurysm    by echo 11/2017   [name] (transient ischemic attack) 11/2017   Tobacco abuse     Past Surgical History:  Procedure Laterality Date   COLONOSCOPY     FINGER SURGERY      Current Medications: Current Meds  Medication Sig   amLODipine (NORVASC) 10 MG tablet Take 1 tablet (10 mg total) by mouth daily.   doxazosin (CARDURA) 8 MG tablet Take 1 tablet (8 mg total) by mouth daily.   furosemide (LASIX) 20 MG tablet Take 20 mg by mouth 2 (two) times daily.   hydrALAZINE (APRESOLINE) 100 MG tablet Take 1 tablet (100 mg total) by mouth 3 (three) times daily.   nitroGLYCERIN (NITROSTAT) 0.4 MG SL tablet Place 1 tablet (0.4 mg total) under the tongue every 5 (five) minutes x 3 doses as needed for chest pain.   Vitamin D, Ergocalciferol, (DRISDOL) 1.25 MG (50000 UNIT) CAPS capsule Take 50,000 Units by mouth every 7 (seven) days.     Allergies:   Patient has no known allergies.  Social History   Socioeconomic History   Marital status: Divorced    Spouse name: Not on file   Number of children: Not on file   Years of education: Not on file   Highest education level: Not on file  Occupational History   Not on file  Tobacco Use   Smoking status: Former Smoker    Packs/day: 1.00    Types: Cigarettes    Quit date: 07/20/2019    Years since quitting: 0.3   Smokeless tobacco: Never Used  Vaping Use   Vaping Use: Never used  Substance and Sexual Activity   Alcohol use: Yes    Alcohol/week: 6.0 standard drinks    Types: 6 Cans of beer per week   Drug use: No   Sexual activity: Not Currently  Other Topics Concern   Not on file  Social History  Narrative   Married   Works in Audiological scientist   Social Determinants of Health   Financial Resource Strain: Low Risk    Difficulty of Paying Living Expenses: Not hard at all  Food Insecurity: No Food Insecurity   Worried About Charity fundraiser in the Last Year: Never true   Arboriculturist in the Last Year: Never true  Transportation Needs: No Transportation Needs   Lack of Transportation (Medical): No   Lack of Transportation (Non-Medical): No  Physical Activity:    Days of Exercise per Week:    Minutes of Exercise per Session:   Stress: No Stress Concern Present   Feeling of Stress : Not at all  Social Connections:    Frequency of Communication with Friends and Family:    Frequency of Social Gatherings with Friends and Family:    Attends Religious Services:    Active Member of Clubs or Organizations:    Attends Archivist Meetings:    Marital Status:      Family History: [name] family history includes Colon cancer in his father; Hypertension in his mother; Stomach cancer in his brother.  ROS:   Please see the history of present illness.     All other systems reviewed and are negative.  EKGs/Labs/Other Studies Reviewed:    The following studies were reviewed today: Lower extremity arterial duplex ultrasound April 2021, carotid ultrasound April 2021, echocardiogram February 2021  EKG:  EKG is not ordered today.  The ekg ordered 08/15/2019 demonstrates sinus rhythm, left atrial abnormality, left ventricular hypertrophy  Recent Labs: 07/18/2019: ALT 19; B Natriuretic Peptide 2,859.6; TSH 2.630 07/21/2019: Hemoglobin 10.6; Platelets 155 07/23/2019: BUN 100; Creatinine, Ser 5.70; Magnesium 2.7; Potassium 4.2; Sodium 140   08/02/2019 creatinine 4.11, hemoglobin 11.2 Recent Lipid Panel    Component Value Date/Time   CHOL 252 (H) 11/10/2017 0721   TRIG 197 (H) 11/10/2017 0721   HDL 46 11/10/2017 0721   CHOLHDL 5.5 11/10/2017  0721   VLDL 39 11/10/2017 0721   LDLCALC 167 (H) 11/10/2017 0721    Physical Exam:    VS:  BP (!) 162/80    Pulse 81    Ht 5\' 7"  (1.702 m)    Wt 149 lb 3.2 oz (67.7 kg)    SpO2 99%    BMI 23.37 kg/m     Rechecked by me, blood pressure is equal in the right upper extremity left upper extremity at 170/73 mmHg.  His home automatic blood pressure cuff systematically underestimates his systolic blood pressure by 28 mmHg.  Wt Readings from Last 3 Encounters:  11/15/19 149 lb 3.2 oz (67.7 kg)  10/12/19 149 lb 12.8 oz (67.9 kg)  09/29/19 151 lb (68.5 kg)     GEN: Thin, fit appearing, well nourished, well developed in no acute distress HEENT: Normal NECK: No JVD; bilateral carotid bruits, right lower than left  LYMPHATICS: No lymphadenopathy CARDIAC: RRR, 2/6 early peaking systolic ejection murmur no diastolic murmurs, rubs, gallops; also has bilateral femoral bruits pedal pulses are absent, but the extremities are not pale and he has good capillary refill.  Symmetrical radial pulses. RESPIRATORY:  Clear to auscultation without rales, wheezing or rhonchi  ABDOMEN: Soft, non-tender, non-distended MUSCULOSKELETAL:  No edema; No deformity  SKIN: Warm and dry NEUROLOGIC:  Alert and oriented x 3 PSYCHIATRIC:  Normal affect   ASSESSMENT:    1. PAD (peripheral artery disease) ([place])   2. Essential hypertension   3. Bilateral carotid artery stenosis   4. Hypercholesterolemia   5. CKD (chronic kidney disease) stage 5, GFR less than 15 ml/min (HCC)   6. PFO (patent foramen ovale)   7. Medication management    PLAN:    In order of problems listed above:  1. PAD: Severe bilateral lower extremity obstruction with iliac stenoses and SFA occlusion bilaterally.  Unfortunately he is not a good candidate yet for angiography and revascularization procedures due to his renal dysfunction.  Described to signs and symptoms of limb threatening ischemia.  He is to seek urgent attention if they  occur. 2. HTN: Need to avoid excessive blood pressure reduction due to the risk of renal failure, but his current blood pressure readings are too high.  We will add isosorbide mononitrate 30 mg once daily and reevaluate in a few weeks.  Although he did not have a dedicated renal duplex ultrasound, the kidneys are normal in size and no report of arterial stenosis was seen on any regular ultrasound in February 2021.  It would not be surprising if he has renal artery stenosis considering the extent of his PAD. 3. Bilateral carotid stenoses: Moderate.  History of [name] 2019.  He should be on aspirin 81 mg daily. 4. HLP: He has severely elevated cholesterol and is no longer taking a statin.  Will restart atorvastatin today and recheck labs in about 3 months. 5. CKD stage V: Discussed the need to have dialysis access placed several weeks/months before predicted need for dialysis, to ensure optimal long-term results. Sees Dr. Elmarie Shiley. 6. Atrial septal aneurysm with PFO: Not sure whether this can be necessarily implicated in his [name], since he has such extensive carotid disease as well.   Medication Adjustments/Labs and Tests Ordered: Current medicines are reviewed at length with the patient today.  Concerns regarding medicines are outlined above.  Orders Placed This Encounter  Procedures   Lipid panel   Meds ordered this encounter  Medications   isosorbide mononitrate (IMDUR) 30 MG 24 hr tablet    Sig: Take 1 tablet (30 mg total) by mouth daily.    Dispense:  90 tablet    Refill:  3   atorvastatin (LIPITOR) 80 MG tablet    Sig: Take 1 tablet (80 mg total) by mouth daily.    Dispense:  90 tablet    Refill:  3    Patient Instructions  Medication Instructions:  START- Isosorbide(Imdur) 30 mg by mouth daily START- Atorvastatin 80 mg by mouth daily START- Aspirin 81 mg by mouth daily  *If you need a refill on your cardiac medications before your next appointment, please call your  pharmacy*   Lab Work: Fasting Lipid  If you have labs (blood work) drawn today and your tests are completely normal, you will receive your results only by:  [place] (if you have MyChart) OR  A paper copy in the mail If you have any lab test that is abnormal or we need to change your treatment, we will call you to review the results.   Testing/Procedures: None Ordered   Follow-Up: At [facility], you and your health needs are our priority.  As part of our continuing mission to provide you with exceptional heart care, we have created designated Provider Care Teams.  These Care Teams include your primary Cardiologist (physician) and Advanced Practice Providers (APPs -  Physician Assistants and Nurse Practitioners) who all work together to provide you with the care you need, when you need it.  We recommend signing up for the patient portal called "MyChart".  Sign up information is provided on this After Visit Summary.  MyChart is used to connect with patients for Virtual Visits (Telemedicine).  Patients are able to view lab/test results, encounter notes, upcoming appointments, etc.  Non-urgent messages can be sent to your provider as well.   To learn more about what you can do with MyChart, go to [email or web address].    Your next appointment:   3 month(s) Thursday September 9th @ 3:20 pm  The format for your next appointment:   In Person  Provider:   You may see Albert Klein, MD or one of the following Advanced Practice Providers on your designated Care Team:    Almyra Deforest, PA-C  Fabian Sharp, [place] or   Roby Lofts, [place]    OTHER:  Monday July 12 th @ 2:45 pm with Jory Sims, DNP    Signed, Albert Klein, MD  11/17/2019 3:23 PM    [place]

## 2019-11-17 ENCOUNTER — Encounter: Payer: Self-pay | Admitting: Cardiovascular Disease

## 2019-11-24 ENCOUNTER — Encounter (HOSPITAL_COMMUNITY): Payer: Medicare Other

## 2019-12-02 ENCOUNTER — Other Ambulatory Visit: Payer: Self-pay

## 2019-12-02 ENCOUNTER — Ambulatory Visit (HOSPITAL_COMMUNITY)
Admission: EM | Admit: 2019-12-02 | Discharge: 2019-12-02 | Disposition: A | Discharge status: Home / Self Care | Payer: Medicare Other | Attending: Emergency Medicine | Admitting: Emergency Medicine

## 2019-12-02 DIAGNOSIS — K047 Periapical abscess without sinus: Secondary | ICD-10-CM

## 2019-12-02 MED ORDER — HYDROCODONE-ACETAMINOPHEN 5-325 MG PO TABS: 1.0000 | ORAL_TABLET | Freq: Four times a day (QID) | ORAL | 0 refills | Status: DC | PRN

## 2019-12-02 MED ORDER — CEFTRIAXONE SODIUM 1 G IJ SOLR
INTRAMUSCULAR | Status: AC
  Filled 2019-12-02: qty 10

## 2019-12-02 MED ORDER — LIDOCAINE HCL (PF) 1 % IJ SOLN
INTRAMUSCULAR | Status: AC
  Filled 2019-12-02: qty 2

## 2019-12-02 MED ORDER — CLINDAMYCIN HCL 300 MG PO CAPS: 300.0000 mg | ORAL_CAPSULE | Freq: Three times a day (TID) | ORAL | 0 refills | Status: AC

## 2019-12-02 MED ORDER — CEFTRIAXONE SODIUM 1 G IJ SOLR
1.0000 g | Freq: Once | INTRAMUSCULAR | Status: AC
  Administered 2019-12-02: 1 g via INTRAMUSCULAR

## 2019-12-02 NOTE — Discharge Instructions (Signed)
We gave you an injection of Rocephin, continue with clindamycin every 8 hours for the next week.  For pain please take 1000 mg of Tylenol every 4-6 hours  I have also provided 2 days worth of stronger pain medication. This should only be used for severe pain. Do not drive or operate machinery while taking this medication.   Please return if you start to experience significant swelling of your face, experiencing fever.

## 2019-12-02 NOTE — ED Triage Notes (Addendum)
Pt present dental swelling on the left side. Symptoms started today. Pt states the pain is very unbearable.  Pt states his blood pressure is elevated because he did not take his medication this am

## 2019-12-03 NOTE — ED Provider Notes (Signed)
[place]    CSN: [id number] Arrival date & time: 12/02/19  1627      History   Chief Complaint Chief Complaint  Patient presents with   Oral Swelling    HPI Albert Harris is a 64 y.o. male history of asthma, carotid stenosis, tobacco use, presenting today for evaluation of dental pain and swelling.  Patient reports over the past 24 hours he developed increased pain and swelling to his left lower jaw.  He denies any associated fevers.  Denies difficulty swallowing or neck stiffness.  Reports that he has many bad teeth in this area.  Has been using Tylenol and Anbesol without relief.  Did not take blood pressure medicine today.  HPI  Past Medical History:  Diagnosis Date   Asthma    Carotid stenosis 09/12/2019   Claudication in peripheral vascular disease ([place]) 06/14/107   Complication of anesthesia    " i HAVE A HARD TIME WAKING UP "   Hypertension    PFO with atrial septal aneurysm    by echo 11/2017   [name] (transient ischemic attack) 11/2017   Tobacco abuse     Patient Active Problem List   Diagnosis Date Noted   Claudication in peripheral vascular disease ([place]) 09/12/2019   Carotid stenosis 09/12/2019   PFO (patent foramen ovale) 08/10/2019   Noncompliance 08/10/2019   Hx of transient ischemic attack ([name]) 08/10/2019   Chronic diastolic heart failure (HCC)    Elevated troponin    Hypertensive emergency    Acute respiratory failure with hypoxia ([place]) 07/18/2019   Acute pulmonary edema ([place]) 07/18/2019   ARF (acute renal failure) ([place]) 07/18/2019   Macrocytic anemia 07/18/2019   Right sided weakness 11/09/2017   CKD (chronic kidney disease), stage III 11/09/2017   Tobacco abuse 01/19/2017   Hypertensive urgency 12/24/2016   [name] (transient ischemic attack) 12/24/2016    Past Surgical History:  Procedure Laterality Date   COLONOSCOPY     FINGER SURGERY         Home Medications    Prior to Admission medications     Medication Sig Start Date End Date Taking? Authorizing Provider  amLODipine (NORVASC) 10 MG tablet Take 1 tablet (10 mg total) by mouth daily. 08/30/19   Kerin Perna, NP  atorvastatin (LIPITOR) 80 MG tablet Take 1 tablet (80 mg total) by mouth daily. 11/15/19 02/13/20  Croitoru, Mihai, MD  cilostazol (PLETAL) 50 MG tablet Take 1 tablet (50 mg total) by mouth 2 (two) times daily. Patient not taking: Reported on 11/15/2019 09/29/19   Lorretta Harp, MD  clindamycin (CLEOCIN) 300 MG capsule Take 1 capsule (300 mg total) by mouth 3 (three) times daily for 7 days. 12/02/19 12/09/19  Montasia Chisenhall C, PA-C  doxazosin (CARDURA) 8 MG tablet Take 1 tablet (8 mg total) by mouth daily. 10/12/19   Skeet Latch, MD  furosemide (LASIX) 20 MG tablet Take 20 mg by mouth 2 (two) times daily.    [provider]  hydrALAZINE (APRESOLINE) 100 MG tablet Take 1 tablet (100 mg total) by mouth 3 (three) times daily. 09/12/19   Skeet Latch, MD  HYDROcodone-acetaminophen (NORCO/VICODIN) 5-325 MG tablet Take 1-2 tablets by mouth every 6 (six) hours as needed for severe pain. 12/02/19   Brandun Pinn C, PA-C  isosorbide mononitrate (IMDUR) 30 MG 24 hr tablet Take 1 tablet (30 mg total) by mouth daily. 11/15/19 02/13/20  Croitoru, Mihai, MD  nitroGLYCERIN (NITROSTAT) 0.4 MG SL tablet Place 1 tablet (0.4  mg total) under the tongue every 5 (five) minutes x 3 doses as needed for chest pain. 07/23/19   Aline August, MD  vitamin B-12 (CYANOCOBALAMIN) 1000 MCG tablet Take 1 tablet (1,000 mcg total) by mouth daily. Patient not taking: Reported on 11/15/2019 07/23/19   Aline August, MD  Vitamin D, Ergocalciferol, (DRISDOL) 1.25 MG (50000 UNIT) CAPS capsule Take 50,000 Units by mouth every 7 (seven) days.    [provider]    Family History Family History  Problem Relation Age of Onset   Hypertension Mother    Colon cancer Father    Stomach cancer Brother     Social History Social History    Tobacco Use   Smoking status: Former Smoker    Packs/day: 1.00    Types: Cigarettes    Quit date: 07/20/2019    Years since quitting: 0.3   Smokeless tobacco: Never Used  Vaping Use   Vaping Use: Never used  Substance Use Topics   Alcohol use: Yes    Alcohol/week: 6.0 standard drinks    Types: 6 Cans of beer per week   Drug use: No     Allergies   Patient has no known allergies.   Review of Systems Review of Systems  Constitutional: Negative for fatigue and fever.  HENT: Positive for dental problem and facial swelling. Negative for trouble swallowing.   Eyes: Negative for redness, itching and visual disturbance.  Respiratory: Negative for shortness of breath.   Cardiovascular: Negative for chest pain and leg swelling.  Gastrointestinal: Negative for nausea and vomiting.  Musculoskeletal: Negative for arthralgias, myalgias, neck pain and neck stiffness.  Skin: Negative for color change, rash and wound.  Neurological: Negative for dizziness, syncope, weakness, light-headedness and headaches.     Physical Exam Triage Vital Signs ED Triage Vitals  Enc Vitals Group     BP 12/02/19 1701 (!) 217/93     Pulse Rate 12/02/19 1701 86     Resp 12/02/19 1701 18     Temp 12/02/19 1701 97.9 F (36.6 C)     Temp Source 12/02/19 1701 Oral     SpO2 12/02/19 1701 100 %     Weight --      Height --      Head Circumference --      Peak Flow --      Pain Score 12/02/19 1700 10     Pain Loc --      Pain Edu? --      Excl. in [place]? --    No data found.  Updated Vital Signs BP (!) 217/93 (BP Location: Left Arm)    Pulse 86    Temp 97.9 F (36.6 C) (Oral)    Resp 18    SpO2 100%   Visual Acuity Right Eye Distance:   Left Eye Distance:   Bilateral Distance:    Right Eye Near:   Left Eye Near:    Bilateral Near:     Physical Exam Vitals and nursing note reviewed.  Constitutional:      Appearance: He is well-developed.     Comments: No acute distress  HENT:      Head: Normocephalic and atraumatic.     Comments: Moderate facial swelling noted to left lower jaw, does not extend below submandibular line/submental area    Nose: Nose normal.     Mouth/Throat:     Comments: Overall poor dentition, gingival swelling erythema noted within left lower lip gingiva/vestibule of the lip  No soft  palate swelling  Eyes:     Conjunctiva/sclera: Conjunctivae normal.  Cardiovascular:     Rate and Rhythm: Normal rate.  Pulmonary:     Effort: Pulmonary effort is normal. No respiratory distress.     Comments: Breathing comfortably at rest, CTABL, no wheezing, rales or other adventitious sounds auscultated Abdominal:     General: There is no distension.  Musculoskeletal:        General: Normal range of motion.     Cervical back: Neck supple.  Skin:    General: Skin is warm and dry.  Neurological:     Mental Status: He is alert and oriented to person, place, and time.      UC Treatments / Results  Labs (all labs ordered are listed, but only abnormal results are displayed) Labs Reviewed - No data to display  EKG   Radiology No results found.  Procedures Procedures (including critical care time)  Medications Ordered in UC Medications  cefTRIAXone (ROCEPHIN) injection 1 g (1 g Intramuscular Given 12/02/19 1806)    Initial Impression / Assessment and Plan / UC Course  I have reviewed the triage vital signs and the nursing notes.  Pertinent labs & imaging results that were available during my care of the patient were reviewed by me and considered in my medical decision making (see chart for details).     Patient with obvious dental abscess, no soft palate swelling, numbness or Ludwig's angina.  Treating with IM Rocephin followed by clindamycin x1 week.  Tylenol for pain, may use hydrocodone for severe pain.  Advised patient to follow-up in emergency room if swelling and symptoms progressing despite use of the above.  Discussed strict return  precautions. Patient verbalized understanding and is agreeable with plan.  Final Clinical Impressions(s) / UC Diagnoses   Final diagnoses:  Dental abscess     Discharge Instructions     We gave you an injection of Rocephin, continue with clindamycin every 8 hours for the next week.  For pain please take 1000 mg of Tylenol every 4-6 hours  I have also provided 2 days worth of stronger pain medication. This should only be used for severe pain. Do not drive or operate machinery while taking this medication.   Please return if you start to experience significant swelling of your face, experiencing fever.   ED Prescriptions    Medication Sig Dispense Auth. Provider   clindamycin (CLEOCIN) 300 MG capsule Take 1 capsule (300 mg total) by mouth 3 (three) times daily for 7 days. 21 capsule Lamarr Feenstra C, PA-C   HYDROcodone-acetaminophen (NORCO/VICODIN) 5-325 MG tablet Take 1-2 tablets by mouth every 6 (six) hours as needed for severe pain. 8 tablet Idolina Mantell, [place] C, PA-C     I have reviewed the PDMP during this encounter.   Scott Fix, [place] C, PA-C 12/03/19 1031

## 2019-12-04 ENCOUNTER — Other Ambulatory Visit: Payer: Self-pay

## 2019-12-04 ENCOUNTER — Ambulatory Visit (HOSPITAL_COMMUNITY)
Admission: RE | Admit: 2019-12-04 | Discharge: 2019-12-04 | Disposition: A | Discharge status: Home / Self Care | Payer: Medicare Other | Source: Ambulatory Visit | Attending: Nephrology | Admitting: Nephrology

## 2019-12-04 VITALS — BP 125/68 | HR 87 | Temp 98.7°F

## 2019-12-04 DIAGNOSIS — N183 Chronic kidney disease, stage 3 unspecified: Secondary | ICD-10-CM | POA: Insufficient documentation

## 2019-12-04 LAB — POCT HEMOGLOBIN-HEMACUE: Hemoglobin: 9.8 g/dL — ABNORMAL LOW (ref 13.0–17.0)

## 2019-12-04 MED ORDER — EPOETIN ALFA-EPBX 10000 UNIT/ML IJ SOLN
INTRAMUSCULAR | Status: AC
  Administered 2019-12-04: 10000 [IU] via SUBCUTANEOUS
  Filled 2019-12-04: qty 1

## 2019-12-04 MED ORDER — EPOETIN ALFA-EPBX 10000 UNIT/ML IJ SOLN: 10000.0000 [IU] | INTRAMUSCULAR | Status: DC

## 2019-12-04 NOTE — Discharge Instructions (Signed)

## 2019-12-16 NOTE — Progress Notes (Signed)
Cardiology Office Note   Date:  12/18/2019   ID:  Bruin, Bolger 1955-12-25, MRN [id number]  PCP:  Kerin Perna, NP  Cardiologist:  Dr.Croitoru  CC; Follow Up BP   History of Present Illness: Albert Harris is a 64 y.o. male who presents for ongoing assessment and management of PAD involving lower extremities and carotid arteries, hypertension, hyperlipidemia, progressive kidney disease now stage V (followed by Dr.Patel, nephrology), with history of previous heavy smoker quitting in February 2021).  He was also noted to have an atrial septal aneurysm with PFO although it was uncertain whether this was related to his [name] in the setting of carotid disease as well.  He has bilateral 40-59% stenosis in the right and left internal carotid arteries and has a greater than 50% stenosis in the right external carotid artery, where he has his loudest bruit.  He has not had a stroke, but had symptoms suspicious of a left hemispheric [name] in June 2019.  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  ABI was 0.36 on the right and 0.44 on the left in April 2021.  On last office visit with Dr. Sallyanne Kuster, 11/15/2019, he continued to have claudication symptoms, after walking roughly 100 feet, relieved by rest, and recurs with similar distance of walking.  He was noted not to be a good candidate for angiography and revascularization procedures because of his renal dysfunction.  He was counseled on the need to seek urgent attention if he had signs of limb threatening ischemia.  He was also noted to have hypertension during office visit.  Isosorbide mononitrate 30 mg daily was added and he is here to evaluate his response to treatment.  He was not found to have renal artery stenosis on a regular ultrasound in February 2021 but has not had a renal ultrasound completed.  He was advised to be on aspirin 81 mg daily due to carotid artery  stenosis which was moderate and history of [name] in 2019.  He was also found not to be on statin therapy and therefore atorvastatin 80 mg daily was restarted with follow-up labs to be completed in 3 months.  He reports that he has never started the statin therapy.  He states that he felt uneasy about taking the medication as he had had statin medications in the past which caused him to feel very badly.  He is tolerating the isosorbide without weakness, dizziness, or fatigue.  He still has not started smoking again, he quit in February 2021.   Past Medical History:  Diagnosis Date  . Asthma   . Carotid stenosis 09/12/2019  . Claudication in peripheral vascular disease ([place]) 09/12/2019  . Complication of anesthesia    " i HAVE A HARD TIME WAKING UP "  . Hypertension   . PFO with atrial septal aneurysm    by echo 11/2017  . [name] (transient ischemic attack) 11/2017  . Tobacco abuse     Past Surgical History:  Procedure Laterality Date  . COLONOSCOPY    . FINGER SURGERY       Current Outpatient Medications  Medication Sig Dispense Refill  . amLODipine (NORVASC) 10 MG tablet Take 1 tablet (10 mg total) by mouth daily. 30 tablet 3  . atorvastatin (LIPITOR) 40 MG tablet Take 1 tablet (40 mg total) by mouth daily. 90 tablet 3  . doxazosin (CARDURA) 8 MG tablet Take 1 tablet (8 mg total) by mouth daily. [place]  tablet 1  . furosemide (LASIX) 20 MG tablet Take 20 mg by mouth 2 (two) times daily.    . hydrALAZINE (APRESOLINE) 100 MG tablet Take 1 tablet (100 mg total) by mouth 3 (three) times daily. 90 tablet 3  . HYDROcodone-acetaminophen (NORCO/VICODIN) 5-325 MG tablet Take 1-2 tablets by mouth every 6 (six) hours as needed for severe pain. 8 tablet 0  . isosorbide mononitrate (IMDUR) 30 MG 24 hr tablet Take 1 tablet (30 mg total) by mouth daily. 90 tablet 3  . nitroGLYCERIN (NITROSTAT) 0.4 MG SL tablet Place 1 tablet (0.4 mg total) under the tongue every 5 (five) minutes x 3 doses as needed for chest  pain. 30 tablet 0  . Vitamin D, Ergocalciferol, (DRISDOL) 1.25 MG (50000 UNIT) CAPS capsule Take 50,000 Units by mouth every 7 (seven) days.     No current facility-administered medications for this visit.   Facility-Administered Medications Ordered in Other Visits  Medication Dose Route Frequency Provider Last Rate Last Admin  . epoetin alfa-epbx (RETACRIT) injection 10,000 Units  10,000 Units Subcutaneous Q14 Days Elmarie Shiley, MD        Allergies:   Patient has no known allergies.    Social History:  The patient  reports that he quit smoking about 4 months ago. His smoking use included cigarettes. He smoked 1.00 pack per day. He has never used smokeless tobacco. He reports current alcohol use of about 6.0 standard drinks of alcohol per week. He reports that he does not use drugs.   Family History:  [name] family history includes Colon cancer in his father; Hypertension in his mother; Stomach cancer in his brother.    ROS: All other systems are reviewed and negative. Unless otherwise mentioned in H&P    PHYSICAL EXAM: VS:  BP 114/66   Pulse 84   Ht 5\' 7"  (1.702 m)   Wt 150 lb 3.2 oz (68.1 kg)   BMI 23.52 kg/m  , BMI Body mass index is 23.52 kg/m. GEN: Well nourished, well developed, in no acute distress HEENT: normal Neck: no JVD, bilateral carotid bruits, or masses Cardiac: RRR; 1/6 systolic murmurs, rubs, or gallops,no edema  Respiratory:  Clear to auscultation bilaterally, normal work of breathing GI: soft, nontender, nondistended, + BS MS: no deformity or atrophy Skin: warm and dry, no rash Neuro:  Strength and sensation are intact Psych: euthymic mood, full affect   EKG: Not completed this office visit  Recent Labs: 07/18/2019: ALT 19; B Natriuretic Peptide 2,859.6; TSH 2.630 07/21/2019: Platelets 155 07/23/2019: BUN 100; Creatinine, Ser 5.70; Magnesium 2.7; Potassium 4.2; Sodium 140 12/04/2019: Hemoglobin 9.8    Lipid Panel    Component Value Date/Time    CHOL 252 (H) 11/10/2017 0721   TRIG 197 (H) 11/10/2017 0721   HDL 46 11/10/2017 0721   CHOLHDL 5.5 11/10/2017 0721   VLDL 39 11/10/2017 0721   LDLCALC 167 (H) 11/10/2017 0721      Wt Readings from Last 3 Encounters:  12/18/19 150 lb 3.2 oz (68.1 kg)  11/15/19 149 lb 3.2 oz (67.7 kg)  10/12/19 149 lb 12.8 oz (67.9 kg)      Other studies Reviewed: Bilateral ABI 09/22/2019 Summary:  Right: Total occlusion noted in the superficial femoral artery. > 50 %  stenosis in right CIA and EIA. Reconstitution at the distal SFA. Three  vessel runoff. Moderate medial calcification with echogenic plaque within  lumen of aorta, iliac and lower  extremity arteries.   Left: Total occlusion noted in  the superficial femoral artery. > 50 %  stenosis in left CIA and EIA. Reconstitution at the distal SFA. Three  vessel runoff. Moderate medial calcification with echogenic plaque within  lumen of aorta, iliac and lower  extremity arteries.   Echocardiogram 07/18/2019 1. Left ventricular ejection fraction, by estimation, is 60 to 65%. The  left ventricle has normal function. The left ventrical has no regional  wall motion abnormalities. There is mildly increased left ventricular  hypertrophy. Left ventricular diastolic  parameters are consistent with Grade II diastolic dysfunction  (pseudonormalization). Elevated left ventricular end-diastolic pressure.  2. Right ventricular systolic function is normal. The right ventricular  size is normal. There is mildly elevated pulmonary artery systolic  pressure. The estimated right ventricular systolic pressure is 62.6 mmHg.  3. Left atrial size was mild to moderately dilated.  4. Mild mitral valve regurgitation.  5. The aortic valve is normal in structure and function. Aortic valve  regurgitation is not visualized. No aortic stenosis is present.  6. The inferior vena cava is dilated in size with <50% respiratory  variability, suggesting right atrial  pressure of 15 mmHg.   ASSESSMENT AND PLAN:  1.  Hypertension: Much better controlled with addition of isosorbide.  He is medically compliant.  He denies any dizziness or fatigue on antihypertensives.  Continue current medication regimen.  He is to take his blood pressure each day after sitting for 5 minutes with both feet on the floor.  If blood pressure is less than 948 systolic he is to call and let us know.  He will continue low-sodium diet is much as he possibly can.  2.  Hyperlipidemia: The patient states he did not start taking the atorvastatin 80 mg as directed.  He was uneasy about starting back on a statin medication as he had been on one in the past and it caused him to feel badly.  I have negotiated with him and he will take 40 mg of atorvastatin to see how he does on it.  If he does not have any problems with it we may consider increasing the dose over time.  I will have him repeat his lipids and LFTs in 6 weeks prior to seeing Dr.Croitoru   3.  Bilateral carotid bruits: I have reviewed most recent carotid ultrasound.  Will likely need to have a repeat in a year to evaluate for further progression of the carotid disease, especially in light of hyperlipidemia and prior history of smoking.  4.  PAD: Most recent ABIs in April 2021 were abnormal.  He will need angiography but kidney function precludes this.  Dr. Gwenlyn Found prefers to wait until he is on hemodialysis to perform any angiography.  He is tolerating Pletal 50 mg twice a day and states he is feeling much better and is able to walk without pain.   Current medicines are reviewed at length with the patient today.  I have spent 25 minutes dedicated to the care of this patient on the date of this encounter to include pre-visit review of records, assessment, management and diagnostic testing,with shared decision making.  Labs/ tests ordered today include: Fasting lipids and LFTs.  Phill Myron. [place] Pugh, ANP, AACC   12/18/2019 5:12 PM      [facility] [place] [address] Office [phone] Fax [phone]  Notice: This dictation was prepared with Dragon dictation along with smaller phrase technology. Any transcriptional errors that result from this process are unintentional and may not be corrected  upon review.

## 2019-12-18 ENCOUNTER — Other Ambulatory Visit: Payer: Self-pay | Admitting: Adult Health

## 2019-12-18 ENCOUNTER — Encounter: Payer: Self-pay | Admitting: Adult Health

## 2019-12-18 ENCOUNTER — Ambulatory Visit (HOSPITAL_COMMUNITY)
Admission: RE | Admit: 2019-12-18 | Discharge: 2019-12-18 | Disposition: A | Discharge status: Home / Self Care | Payer: Medicare Other | Source: Ambulatory Visit | Attending: Nephrology | Admitting: Nephrology

## 2019-12-18 ENCOUNTER — Other Ambulatory Visit: Payer: Self-pay

## 2019-12-18 ENCOUNTER — Ambulatory Visit (INDEPENDENT_AMBULATORY_CARE_PROVIDER_SITE_OTHER): Payer: Medicare Other | Admitting: Adult Health

## 2019-12-18 VITALS — BP 105/62 | HR 80 | Temp 97.7°F | Resp 18

## 2019-12-18 VITALS — BP 114/66 | HR 84 | Ht 67.0 in | Wt 150.2 lb

## 2019-12-18 DIAGNOSIS — N183 Chronic kidney disease, stage 3 unspecified: Secondary | ICD-10-CM | POA: Diagnosis present

## 2019-12-18 DIAGNOSIS — E7800 Pure hypercholesterolemia, unspecified: Secondary | ICD-10-CM | POA: Diagnosis not present

## 2019-12-18 DIAGNOSIS — Z79899 Other long term (current) drug therapy: Secondary | ICD-10-CM

## 2019-12-18 DIAGNOSIS — I6523 Occlusion and stenosis of bilateral carotid arteries: Secondary | ICD-10-CM | POA: Diagnosis not present

## 2019-12-18 MED ORDER — ATORVASTATIN CALCIUM 40 MG PO TABS: 40.0000 mg | ORAL_TABLET | Freq: Every day | ORAL | 3 refills | Status: DC

## 2019-12-18 MED ORDER — EPOETIN ALFA-EPBX 10000 UNIT/ML IJ SOLN
INTRAMUSCULAR | Status: AC
  Administered 2019-12-18: 10000 [IU]
  Filled 2019-12-18: qty 1

## 2019-12-18 MED ORDER — EPOETIN ALFA-EPBX 10000 UNIT/ML IJ SOLN: 10000.0000 [IU] | INTRAMUSCULAR | Status: DC

## 2019-12-18 MED FILL — ATORVASTATIN CALCIUM 40 MG: 40 | 30 days supply | Qty: 30 | Fill #0

## 2019-12-18 NOTE — Patient Instructions (Signed)
Medication Instructions:  DECREASE- Atorvastatin 10 mg by mouth daily  *If you need a refill on your cardiac medications before your next appointment, please call your pharmacy*   Lab Work: Fasting Lipid and Liver in 3 Months  If you have labs (blood work) drawn today and your tests are completely normal, you will receive your results only by: Marland Kitchen MyChart Message (if you have MyChart) OR . A paper copy in the mail If you have any lab test that is abnormal or we need to change your treatment, we will call you to review the results.   Testing/Procedures: None Ordered   Follow-Up: At [facility], you and your health needs are our priority.  As part of our continuing mission to provide you with exceptional heart care, we have created designated Provider Care Teams.  These Care Teams include your primary Cardiologist (physician) and Advanced Practice Providers (APPs -  Physician Assistants and Nurse Practitioners) who all work together to provide you with the care you need, when you need it.  We recommend signing up for the patient portal called "MyChart".  Sign up information is provided on this After Visit Summary.  MyChart is used to connect with patients for Virtual Visits (Telemedicine).  Patients are able to view lab/test results, encounter notes, upcoming appointments, etc.  Non-urgent messages can be sent to your provider as well.   To learn more about what you can do with MyChart, go to [email or web address].    Your next appointment:   3 month(s)  The format for your next appointment:   In Person  Provider:   You may see Sanda Klein, MD or one of the following Advanced Practice Providers on your designated Care Team:    Almyra Deforest, PA-C  Fabian Sharp, PA-C or   Roby Lofts, [place]

## 2019-12-19 LAB — POCT HEMOGLOBIN-HEMACUE: Hemoglobin: 10.7 g/dL — ABNORMAL LOW (ref 13.0–17.0)

## 2019-12-19 NOTE — Progress Notes (Signed)
Thank you - hard negotiating on meds with all my patients, I see.

## 2019-12-21 NOTE — Telephone Encounter (Signed)
Patient has been seen by Dr Sallyanne Kuster

## 2020-01-01 ENCOUNTER — Encounter (HOSPITAL_COMMUNITY)
Admission: RE | Admit: 2020-01-01 | Discharge: 2020-01-01 | Disposition: A | Discharge status: Home / Self Care | Payer: Medicare Other | Source: Ambulatory Visit | Attending: Nephrology | Admitting: Nephrology

## 2020-01-01 ENCOUNTER — Other Ambulatory Visit: Payer: Self-pay

## 2020-01-01 VITALS — BP 167/70 | HR 79 | Temp 97.3°F | Resp 18

## 2020-01-01 DIAGNOSIS — N185 Chronic kidney disease, stage 5: Secondary | ICD-10-CM | POA: Insufficient documentation

## 2020-01-01 DIAGNOSIS — N183 Chronic kidney disease, stage 3 unspecified: Secondary | ICD-10-CM

## 2020-01-01 DIAGNOSIS — D631 Anemia in chronic kidney disease: Secondary | ICD-10-CM | POA: Insufficient documentation

## 2020-01-01 LAB — POCT HEMOGLOBIN-HEMACUE: Hemoglobin: 11.3 g/dL — ABNORMAL LOW (ref 13.0–17.0)

## 2020-01-01 LAB — MAGNESIUM: Magnesium: 2.5 mg/dL — ABNORMAL HIGH (ref 1.7–2.4)

## 2020-01-01 LAB — RENAL FUNCTION PANEL
Albumin: 2.9 g/dL — ABNORMAL LOW (ref 3.5–5.0)
Anion gap: 13 (ref 5–15)
BUN: 61 mg/dL — ABNORMAL HIGH (ref 8–23)
CO2: 16 mmol/L — ABNORMAL LOW (ref 22–32)
Calcium: 9.1 mg/dL (ref 8.9–10.3)
Chloride: 110 mmol/L (ref 98–111)
Creatinine, Ser: 6.37 mg/dL — ABNORMAL HIGH (ref 0.61–1.24)
GFR calc Af Amer: 10 mL/min — ABNORMAL LOW (ref >60)
GFR calc non Af Amer: 8 mL/min — ABNORMAL LOW (ref >60)
Glucose, Bld: 121 mg/dL — ABNORMAL HIGH (ref 70–99)
Phosphorus: 7 mg/dL — ABNORMAL HIGH (ref 2.5–4.6)
Potassium: 3.5 mmol/L (ref 3.5–5.1)
Sodium: 139 mmol/L (ref 135–145)

## 2020-01-01 LAB — IRON AND TIBC
Iron: 72 ug/dL (ref 45–182)
Saturation Ratios: 25 % (ref 17.9–39.5)
TIBC: 287 ug/dL (ref 250–450)
UIBC: 215 ug/dL

## 2020-01-01 LAB — FERRITIN: Ferritin: 94 ng/mL (ref 24–336)

## 2020-01-01 MED ORDER — EPOETIN ALFA-EPBX 10000 UNIT/ML IJ SOLN
INTRAMUSCULAR | Status: AC
  Filled 2020-01-01: qty 1

## 2020-01-01 MED ORDER — EPOETIN ALFA-EPBX 10000 UNIT/ML IJ SOLN
10000.0000 [IU] | INTRAMUSCULAR | Status: DC
  Administered 2020-01-01: 10000 [IU] via SUBCUTANEOUS

## 2020-01-02 LAB — PTH, INTACT AND CALCIUM
Calcium, Total (PTH): 9 mg/dL (ref 8.6–10.2)
PTH: 60 pg/mL (ref 15–65)

## 2020-01-03 ENCOUNTER — Telehealth: Payer: Self-pay | Admitting: Cardiovascular Disease

## 2020-01-03 NOTE — Telephone Encounter (Signed)
Unable to LVM for patient, mailbox full. The patient needs to be scheduled for follow up from recall list

## 2020-01-05 ENCOUNTER — Telehealth: Payer: Self-pay

## 2020-01-05 NOTE — Telephone Encounter (Signed)
Called patient to discuss inactivity with BP checks with [facility]. Left patient message to return call.

## 2020-01-08 ENCOUNTER — Telehealth: Payer: Self-pay

## 2020-01-08 DIAGNOSIS — Z0000 Encounter for general adult medical examination without abnormal findings: Secondary | ICD-10-CM

## 2020-01-08 NOTE — Telephone Encounter (Signed)
Called patient to discuss BP checks in [place]. Left message for patient to return call to [phone].

## 2020-01-11 ENCOUNTER — Ambulatory Visit (INDEPENDENT_AMBULATORY_CARE_PROVIDER_SITE_OTHER): Payer: Medicare Other | Admitting: Cardiovascular Disease

## 2020-01-11 ENCOUNTER — Other Ambulatory Visit: Payer: Self-pay

## 2020-01-11 ENCOUNTER — Encounter: Payer: Self-pay | Admitting: Cardiovascular Disease

## 2020-01-11 VITALS — BP 186/90 | HR 79 | Ht 67.0 in | Wt 150.0 lb

## 2020-01-11 DIAGNOSIS — I6523 Occlusion and stenosis of bilateral carotid arteries: Secondary | ICD-10-CM

## 2020-01-11 DIAGNOSIS — I160 Hypertensive urgency: Secondary | ICD-10-CM | POA: Diagnosis not present

## 2020-01-11 DIAGNOSIS — I739 Peripheral vascular disease, unspecified: Secondary | ICD-10-CM | POA: Diagnosis not present

## 2020-01-11 DIAGNOSIS — I5032 Chronic diastolic (congestive) heart failure: Secondary | ICD-10-CM

## 2020-01-11 NOTE — Patient Instructions (Addendum)
Medication Instructions:  START ASPIRIN 81 MG DAILY   *If you need a refill on your cardiac medications before your next appointment, please call your pharmacy*  Lab Work: NONE  Testing/Procedures: NONE  Follow-Up: KEEP APPOINTMENT NEXT MONTH WITH DR [employer]

## 2020-01-11 NOTE — Progress Notes (Signed)
[facility] Follow-up appointment:    Date:  01/11/2020   ID:  Albert Harris, Albert Harris 11-Jul-1955, MRN [id number]  PCP:  Kerin Perna, NP  Cardiologist:  Sanda Klein, MD  Nephrologist: Dr. Posey Pronto- [facility]  Referring MD: Kerin Perna, NP   CC: Hypertension  History of Present Illness:    Albert Harris is a 64 y.o. male with a hx of chronic diastolic heart failure, hypertension, hyperlipidemia, CKD III, [name], PFO, tobacco abuse, and asthma  Here for follow up.  He was initially seen 09/2019 to establish care in the hypertension clinic.  He was admitted 08/2019 with hypertensive urgency in the setting of not being on medication over a year.  BP was 228/173.  He also hd AKI, creatinine 5.38.  He reports being told he had hypertension since he was a young man.  However he was never on any medication for it.  He was started on amlodipine, carvedilol, and hydralazine.  Since discharge he saw his PCP for a face abscess and his BP was 200/110.  They verified his medications with his nephrologist and he reported that he was not taking them as prescribed.  He notes that he feels well when he does not take his medicines.  However when he does take it he feels sluggish.  He does not have energy to do anything.  Therefore he sometimes goes without taking them.  He went for a week without taking them prior to seeing his PCP and this was the cause of his blood pressure being so elevated.  He does not get much exercise.  He likes to go walk [employer].  He notices that sometimes when walking he gets pain in his R calf.   At his last appointment he was referred to Dr. Gwenlyn Found because of lifestyle limiting claudication.  He followed up with our pharmacist 10/2019 and doxazosin was increased.  Given his CKD 5 he was unable to go undergo angiography despite abnormal ABIs.  He will be reevaluated if he does need to start HD.  For now he was started on Pletal.  He tried it but it  wasn't helping so he stopped taking it.  He notes that his diet is not the best that he has a lot of salt.  His wife mostly cooks at home.  They have started using no salt for seasoning.  His breakfast typically consist of bacon, eggs, biscuits, meat, and gravy.  At his last appointment his blood pressure was poorly controlled.  He had a lot of fatigue so carvedilol was discontinued and he was started on hydralazine and doxazosin.  He notes that his energy levels have been much better since making that change.  Since changing this and adding Imdur his blood pressure has actually been much better-controlled.  He has had several readings in the 110s.  Today he did not take his medication because he overslept.  He was enrolled in our remote patient monitoring program.  However he noted in the machine was not reading accurately so he stopped using them.  Overall he is feeling well.  He gets exercise by going up and down the steps regularly with a laundry basket.  He also goes walking.  He has no exertional chest pain or shortness of breath.  He notes that he does get pain in his right leg.  When this happens he stops and then starts walking again once the pain resolves.  He has no lower extremity  edema, orthopnea, or PND.  He was seen in the ED 6/26 with a dental abscess requiring antibiotics.   Past Medical History:  Diagnosis Date  . Asthma   . Carotid stenosis 09/12/2019  . Claudication in peripheral vascular disease ([place]) 09/12/2019  . Complication of anesthesia    " i HAVE A HARD TIME WAKING UP "  . Hypertension   . PFO with atrial septal aneurysm    by echo 11/2017  . [name] (transient ischemic attack) 11/2017  . Tobacco abuse     Past Surgical History:  Procedure Laterality Date  . COLONOSCOPY    . FINGER SURGERY      Current Medications: Current Meds  Medication Sig  . aspirin EC 81 MG tablet Take 81 mg by mouth daily.     Allergies:   Patient has no known allergies.   Social History    Socioeconomic History  . Marital status: Divorced    Spouse name: Not on file  . Number of children: Not on file  . Years of education: Not on file  . Highest education level: Not on file  Occupational History  . Not on file  Tobacco Use  . Smoking status: Former Smoker    Packs/day: 1.00    Types: Cigarettes    Quit date: 07/20/2019    Years since quitting: 0.4  . Smokeless tobacco: Never Used  Vaping Use  . Vaping Use: Never used  Substance and Sexual Activity  . Alcohol use: Yes    Alcohol/week: 6.0 standard drinks    Types: 6 Cans of beer per week  . Drug use: No  . Sexual activity: Not Currently  Other Topics Concern  . Not on file  Social History Narrative   Married   Works in Audiological scientist   Social Determinants of Health   Financial Resource Strain: Low Risk   . Difficulty of Paying Living Expenses: Not hard at all  Food Insecurity: No Food Insecurity  . Worried About Charity fundraiser in the Last Year: Never true  . Ran Out of Food in the Last Year: Never true  Transportation Needs: No Transportation Needs  . Lack of Transportation (Medical): No  . Lack of Transportation (Non-Medical): No  Physical Activity:   . Days of Exercise per Week:   . Minutes of Exercise per Session:   Stress: No Stress Concern Present  . Feeling of Stress : Not at all  Social Connections:   . Frequency of Communication with Friends and Family:   . Frequency of Social Gatherings with Friends and Family:   . Attends Religious Services:   . Active Member of Clubs or Organizations:   . Attends Archivist Meetings:   Marland Kitchen Marital Status:      Family History: [name] family history includes Colon cancer in his father; Hypertension in his mother; Stomach cancer in his brother.  ROS:   Please see the history of present illness.     All other systems reviewed and are negative.  EKGs/Labs/Other Studies Reviewed:    EKG:  EKG is not ordered today.   The ekg ordered 08/10/2019 demonstrates : Sinus rhythm.  Rate 78 bpm.  Left atrial enlargement.  LVH with secondary repolarization abnormalities.  Recent Labs: 07/18/2019: ALT 19; B Natriuretic Peptide 2,859.6; TSH 2.630 07/21/2019: Platelets 155 01/01/2020: BUN 61; Creatinine, Ser 6.37; Hemoglobin 11.3; Magnesium 2.5; Potassium 3.5; Sodium 139   Recent Lipid Panel    Component Value Date/Time  CHOL 252 (H) 11/10/2017 0721   TRIG 197 (H) 11/10/2017 0721   HDL 46 11/10/2017 0721   CHOLHDL 5.5 11/10/2017 0721   VLDL 39 11/10/2017 0721   LDLCALC 167 (H) 11/10/2017 0721    Physical Exam:    VS:  BP (!) 186/90   Pulse 79   Ht 5\' 7"  (1.702 m)   Wt 150 lb (68 kg)   SpO2 99%   BMI 23.49 kg/m  , BMI Body mass index is 23.49 kg/m. GENERAL:  Well appearing HEENT: Pupils equal round and reactive, fundi not visualized, oral mucosa unremarkable NECK:  No jugular venous distention, waveform within normal limits, carotid upstroke brisk and symmetric, bilateral carotid bruits, no thyromegaly LYMPHATICS:  No cervical adenopathy LUNGS:  Clear to auscultation bilaterally HEART:  RRR.  PMI not displaced or sustained,S1 and S2 within normal limits, no S3, no S4, no clicks, no rubs, no murmurs ABD:  Flat, positive bowel sounds normal in frequency in pitch, no bruits, no rebound, no guarding, no midline pulsatile mass, no hepatomegaly, no splenomegaly EXT: Unable to palpate DP/PT bilaterally, no edema, no cyanosis no clubbing SKIN:  No rashes no nodules  Diminished hair growth to mid tibia NEURO:  Cranial nerves II through XII grossly intact, motor grossly intact throughout PSYCH:  Cognitively intact, oriented to person place and time   ASSESSMENT:    1. Hypertensive urgency   2. Bilateral carotid artery stenosis   3. Chronic diastolic heart failure (HCC)   4. Claudication in peripheral vascular disease ([place])     PLAN:    # Essential hypertension:   Blood pressure is poorly controlled today,  however he has not taken his medication. When he actually takes his medicine is much better controlled.  In fact we have had readings in the 110s.  The most important thing is reminding him to take it consistently.  He was congratulated on his smoking cessation.  He was referred to exercise program but did not bring participating.  Encouraged him to continue exercising regularly.  This should also help with his PAD and claudication.  Continue working on diet and limiting sodium.  Continue amlodipine, doxazosin, hydralazine, and Imdur.  # Claudication: # PAD:  # Hyperlipidemia:   ABIs and Dopplers revealed total occlusion in the right and left SFA with greater than 50% stenosis in the right and left CIA and EIA.  There was reconstitution of the distal SFA.  He did not tolerate Pletal.  We will make sure that he starts back taking aspirin 81 atorvastatin with an LDL goal of less than 70.  He is not a candidate for intervention given his CKD 5.  If he progresses to dialysis, this could be reconsidered if he continues to have symptoms.  For now walking daily.  # Carotid stenosis:   Mild carotid stenosis bilaterally in 2019 and increased to moderate 09/2019.  Aspirin and lipids as above.  He needs repeat Dopplers in 1 year.  # Tobacco abuse:  Patient was congratulated on smoking cessation since 07/2019.   Disposition:    FU with Dr. Sallyanne Kuster as scheduled.  He will be discharged from ADV HTN clinic as his BP is at goal when he takes his medication.   Medication Adjustments/Labs and Tests Ordered: Current medicines are reviewed at length with the patient today.  Concerns regarding medicines are outlined above.  No orders of the defined types were placed in this encounter.  No orders of the defined types were placed in this  encounter.    Signed, Skeet Latch, MD  01/11/2020 12:11 PM    [place]

## 2020-01-15 ENCOUNTER — Encounter (HOSPITAL_COMMUNITY)
Admission: RE | Admit: 2020-01-15 | Discharge: 2020-01-15 | Disposition: A | Discharge status: Home / Self Care | Payer: Medicare Other | Source: Ambulatory Visit | Attending: Nephrology | Admitting: Nephrology

## 2020-01-15 ENCOUNTER — Other Ambulatory Visit: Payer: Self-pay

## 2020-01-15 VITALS — BP 156/74 | HR 80 | Resp 18

## 2020-01-15 DIAGNOSIS — N185 Chronic kidney disease, stage 5: Secondary | ICD-10-CM | POA: Insufficient documentation

## 2020-01-15 DIAGNOSIS — D631 Anemia in chronic kidney disease: Secondary | ICD-10-CM | POA: Diagnosis not present

## 2020-01-15 DIAGNOSIS — N183 Chronic kidney disease, stage 3 unspecified: Secondary | ICD-10-CM

## 2020-01-15 LAB — POCT HEMOGLOBIN-HEMACUE: Hemoglobin: 11.4 g/dL — ABNORMAL LOW (ref 13.0–17.0)

## 2020-01-15 MED ORDER — EPOETIN ALFA-EPBX 10000 UNIT/ML IJ SOLN
INTRAMUSCULAR | Status: AC
  Filled 2020-01-15: qty 1

## 2020-01-15 MED ORDER — EPOETIN ALFA-EPBX 10000 UNIT/ML IJ SOLN
10000.0000 [IU] | INTRAMUSCULAR | Status: DC
  Administered 2020-01-15: 10000 [IU] via SUBCUTANEOUS

## 2020-01-23 ENCOUNTER — Other Ambulatory Visit: Payer: Self-pay

## 2020-01-23 DIAGNOSIS — N183 Chronic kidney disease, stage 3 unspecified: Secondary | ICD-10-CM

## 2020-01-29 ENCOUNTER — Encounter (HOSPITAL_COMMUNITY): Payer: Medicare Other

## 2020-02-05 ENCOUNTER — Other Ambulatory Visit: Payer: Self-pay

## 2020-02-05 ENCOUNTER — Encounter (HOSPITAL_COMMUNITY)
Admission: RE | Admit: 2020-02-05 | Discharge: 2020-02-05 | Disposition: A | Discharge status: Home / Self Care | Payer: Medicare Other | Source: Ambulatory Visit | Attending: Nephrology | Admitting: Nephrology

## 2020-02-05 VITALS — BP 100/58 | HR 68 | Temp 97.4°F | Resp 16

## 2020-02-05 DIAGNOSIS — N183 Chronic kidney disease, stage 3 unspecified: Secondary | ICD-10-CM

## 2020-02-05 DIAGNOSIS — N185 Chronic kidney disease, stage 5: Secondary | ICD-10-CM | POA: Diagnosis not present

## 2020-02-05 LAB — IRON AND TIBC
Iron: 90 ug/dL (ref 45–182)
Saturation Ratios: 33 % (ref 17.9–39.5)
TIBC: 276 ug/dL (ref 250–450)
UIBC: 186 ug/dL

## 2020-02-05 LAB — MAGNESIUM: Magnesium: 2.4 mg/dL (ref 1.7–2.4)

## 2020-02-05 LAB — RENAL FUNCTION PANEL
Albumin: 2.9 g/dL — ABNORMAL LOW (ref 3.5–5.0)
Anion gap: 17 — ABNORMAL HIGH (ref 5–15)
BUN: 82 mg/dL — ABNORMAL HIGH (ref 8–23)
CO2: 14 mmol/L — ABNORMAL LOW (ref 22–32)
Calcium: 8.9 mg/dL (ref 8.9–10.3)
Chloride: 109 mmol/L (ref 98–111)
Creatinine, Ser: 8.36 mg/dL — ABNORMAL HIGH (ref 0.61–1.24)
GFR calc Af Amer: 7 mL/min — ABNORMAL LOW (ref >60)
GFR calc non Af Amer: 6 mL/min — ABNORMAL LOW (ref >60)
Glucose, Bld: 98 mg/dL (ref 70–99)
Phosphorus: 6.8 mg/dL — ABNORMAL HIGH (ref 2.5–4.6)
Potassium: 3.5 mmol/L (ref 3.5–5.1)
Sodium: 140 mmol/L (ref 135–145)

## 2020-02-05 LAB — FERRITIN: Ferritin: 69 ng/mL (ref 24–336)

## 2020-02-05 LAB — POCT HEMOGLOBIN-HEMACUE: Hemoglobin: 11.4 g/dL — ABNORMAL LOW (ref 13.0–17.0)

## 2020-02-05 MED ORDER — EPOETIN ALFA-EPBX 10000 UNIT/ML IJ SOLN: 10000.0000 [IU] | INTRAMUSCULAR | Status: DC

## 2020-02-05 MED ORDER — EPOETIN ALFA-EPBX 10000 UNIT/ML IJ SOLN
INTRAMUSCULAR | Status: AC
  Administered 2020-02-05: 10000 [IU] via SUBCUTANEOUS
  Filled 2020-02-05: qty 1

## 2020-02-06 ENCOUNTER — Other Ambulatory Visit (HOSPITAL_COMMUNITY): Payer: Medicare Other

## 2020-02-06 ENCOUNTER — Inpatient Hospital Stay (HOSPITAL_COMMUNITY): Admission: RE | Admit: 2020-02-06 | Payer: Medicare Other | Source: Ambulatory Visit

## 2020-02-06 ENCOUNTER — Encounter: Payer: Medicare Other | Admitting: Vascular Surgery

## 2020-02-06 LAB — PTH, INTACT AND CALCIUM
Calcium, Total (PTH): 8.7 mg/dL (ref 8.6–10.2)
PTH: 39 pg/mL (ref 15–65)

## 2020-02-13 ENCOUNTER — Encounter (HOSPITAL_COMMUNITY): Payer: Medicare Other

## 2020-02-15 ENCOUNTER — Ambulatory Visit: Payer: Medicare Other | Admitting: Cardiovascular Disease

## 2020-02-19 ENCOUNTER — Other Ambulatory Visit: Payer: Self-pay

## 2020-02-19 ENCOUNTER — Encounter (HOSPITAL_COMMUNITY)
Admission: RE | Admit: 2020-02-19 | Discharge: 2020-02-19 | Disposition: A | Discharge status: Home / Self Care | Payer: Medicare Other | Source: Ambulatory Visit | Attending: Nephrology | Admitting: Nephrology

## 2020-02-19 VITALS — BP 160/62 | HR 82 | Temp 98.0°F | Resp 18

## 2020-02-19 DIAGNOSIS — N183 Chronic kidney disease, stage 3 unspecified: Secondary | ICD-10-CM | POA: Diagnosis present

## 2020-02-19 LAB — POCT HEMOGLOBIN-HEMACUE: Hemoglobin: 11.6 g/dL — ABNORMAL LOW (ref 13.0–17.0)

## 2020-02-19 MED ORDER — EPOETIN ALFA-EPBX 10000 UNIT/ML IJ SOLN
10000.0000 [IU] | INTRAMUSCULAR | Status: DC
  Administered 2020-02-19: 10000 [IU] via SUBCUTANEOUS

## 2020-02-19 MED ORDER — EPOETIN ALFA-EPBX 10000 UNIT/ML IJ SOLN
INTRAMUSCULAR | Status: AC
  Filled 2020-02-19: qty 1

## 2020-02-20 MED FILL — AMLODIPINE BESYLATE 10 MG T: 10 | 30 days supply | Qty: 30 | Fill #3

## 2020-02-20 MED FILL — VIT D2 1.25 MG (50,000 UNIT: 1.25 MG | 27 days supply | Qty: 4 | Fill #2

## 2020-02-29 MED FILL — SODIUM BICARBONATE 650 MG T: 650 | 30 days supply | Qty: 60 | Fill #0

## 2020-03-04 ENCOUNTER — Other Ambulatory Visit: Payer: Self-pay

## 2020-03-04 ENCOUNTER — Encounter (HOSPITAL_COMMUNITY)
Admission: RE | Admit: 2020-03-04 | Discharge: 2020-03-04 | Disposition: A | Discharge status: Home / Self Care | Payer: Medicare Other | Source: Ambulatory Visit | Attending: Nephrology | Admitting: Nephrology

## 2020-03-04 VITALS — BP 170/65 | HR 92 | Temp 99.1°F | Resp 18

## 2020-03-04 DIAGNOSIS — N183 Chronic kidney disease, stage 3 unspecified: Secondary | ICD-10-CM | POA: Diagnosis not present

## 2020-03-04 DIAGNOSIS — D631 Anemia in chronic kidney disease: Secondary | ICD-10-CM | POA: Insufficient documentation

## 2020-03-04 DIAGNOSIS — N185 Chronic kidney disease, stage 5: Secondary | ICD-10-CM | POA: Insufficient documentation

## 2020-03-04 LAB — RENAL FUNCTION PANEL
Albumin: 2.6 g/dL — ABNORMAL LOW (ref 3.5–5.0)
Anion gap: 15 (ref 5–15)
BUN: 68 mg/dL — ABNORMAL HIGH (ref 8–23)
CO2: 13 mmol/L — ABNORMAL LOW (ref 22–32)
Calcium: 8.1 mg/dL — ABNORMAL LOW (ref 8.9–10.3)
Chloride: 113 mmol/L — ABNORMAL HIGH (ref 98–111)
Creatinine, Ser: 9.25 mg/dL — ABNORMAL HIGH (ref 0.61–1.24)
GFR calc Af Amer: 6 mL/min — ABNORMAL LOW (ref >60)
GFR calc non Af Amer: 5 mL/min — ABNORMAL LOW (ref >60)
Glucose, Bld: 97 mg/dL (ref 70–99)
Phosphorus: 9.2 mg/dL — ABNORMAL HIGH (ref 2.5–4.6)
Potassium: 3.9 mmol/L (ref 3.5–5.1)
Sodium: 141 mmol/L (ref 135–145)

## 2020-03-04 LAB — FERRITIN: Ferritin: 82 ng/mL (ref 24–336)

## 2020-03-04 LAB — POCT HEMOGLOBIN-HEMACUE: Hemoglobin: 10.1 g/dL — ABNORMAL LOW (ref 13.0–17.0)

## 2020-03-04 LAB — IRON AND TIBC
Iron: 70 ug/dL (ref 45–182)
Saturation Ratios: 27 % (ref 17.9–39.5)
TIBC: 255 ug/dL (ref 250–450)
UIBC: 185 ug/dL

## 2020-03-04 LAB — MAGNESIUM: Magnesium: 2.6 mg/dL — ABNORMAL HIGH (ref 1.7–2.4)

## 2020-03-04 MED ORDER — EPOETIN ALFA-EPBX 10000 UNIT/ML IJ SOLN
INTRAMUSCULAR | Status: AC
  Filled 2020-03-04: qty 1

## 2020-03-04 MED ORDER — EPOETIN ALFA-EPBX 10000 UNIT/ML IJ SOLN
10000.0000 [IU] | INTRAMUSCULAR | Status: DC
  Administered 2020-03-04: 10000 [IU] via SUBCUTANEOUS

## 2020-03-05 LAB — PTH, INTACT AND CALCIUM
Calcium, Total (PTH): 7.9 mg/dL — ABNORMAL LOW (ref 8.6–10.2)
PTH: 133 pg/mL — ABNORMAL HIGH (ref 15–65)

## 2020-03-08 ENCOUNTER — Ambulatory Visit: Payer: Medicare Other | Admitting: Cardiovascular Disease

## 2020-03-12 NOTE — Addendum Note (Signed)
Addended by: Mike Craze on: 03/12/2020 02:08 PM   Modules accepted: Orders

## 2020-03-18 ENCOUNTER — Encounter (HOSPITAL_COMMUNITY)
Admission: RE | Admit: 2020-03-18 | Discharge: 2020-03-18 | Disposition: A | Discharge status: Home / Self Care | Payer: Medicare Other | Source: Ambulatory Visit | Attending: Nephrology | Admitting: Nephrology

## 2020-03-18 ENCOUNTER — Other Ambulatory Visit: Payer: Self-pay

## 2020-03-18 VITALS — BP 133/67 | HR 80 | Temp 97.9°F | Resp 18

## 2020-03-18 DIAGNOSIS — D631 Anemia in chronic kidney disease: Secondary | ICD-10-CM | POA: Diagnosis not present

## 2020-03-18 DIAGNOSIS — N183 Chronic kidney disease, stage 3 unspecified: Secondary | ICD-10-CM | POA: Insufficient documentation

## 2020-03-18 LAB — POCT HEMOGLOBIN-HEMACUE: Hemoglobin: 10.5 g/dL — ABNORMAL LOW (ref 13.0–17.0)

## 2020-03-18 MED ORDER — EPOETIN ALFA-EPBX 10000 UNIT/ML IJ SOLN
10000.0000 [IU] | INTRAMUSCULAR | Status: DC
  Administered 2020-03-18: 10000 [IU] via SUBCUTANEOUS

## 2020-03-18 MED ORDER — EPOETIN ALFA-EPBX 10000 UNIT/ML IJ SOLN
INTRAMUSCULAR | Status: AC
  Filled 2020-03-18: qty 1

## 2020-03-19 ENCOUNTER — Other Ambulatory Visit: Payer: Self-pay

## 2020-03-19 ENCOUNTER — Ambulatory Visit (INDEPENDENT_AMBULATORY_CARE_PROVIDER_SITE_OTHER)
Admission: RE | Admit: 2020-03-19 | Discharge: 2020-03-19 | Disposition: A | Discharge status: Home / Self Care | Payer: Medicare Other | Source: Ambulatory Visit | Attending: Vascular Surgery | Admitting: Vascular Surgery

## 2020-03-19 ENCOUNTER — Ambulatory Visit (HOSPITAL_COMMUNITY)
Admission: RE | Admit: 2020-03-19 | Discharge: 2020-03-19 | Disposition: A | Discharge status: Home / Self Care | Payer: Medicare Other | Source: Ambulatory Visit | Attending: Vascular Surgery | Admitting: Vascular Surgery

## 2020-03-19 ENCOUNTER — Ambulatory Visit (INDEPENDENT_AMBULATORY_CARE_PROVIDER_SITE_OTHER): Payer: Medicare Other | Admitting: Vascular Surgery

## 2020-03-19 ENCOUNTER — Encounter: Payer: Self-pay | Admitting: Vascular Surgery

## 2020-03-19 DIAGNOSIS — N185 Chronic kidney disease, stage 5: Secondary | ICD-10-CM | POA: Insufficient documentation

## 2020-03-19 DIAGNOSIS — N183 Chronic kidney disease, stage 3 unspecified: Secondary | ICD-10-CM

## 2020-03-19 NOTE — Progress Notes (Signed)
Patient name: Albert Harris MRN: [id number] DOB: [birth year] Sex: male  REASON FOR CONSULT: Evaluate for permanent dialysis access  HPI: DOYAL SARIC is a 64 y.o. male, with history of stage V chronic kidney disease, COPD, CHF, tobacco abuse, hypertension that presents for evaluation of permanent dialysis access.  Patient states he is not on dialysis at this time.  He has had no previous dialysis access in the past.  No chest wall implants.  Patient is right-handed.  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  No upper extremity numbness weakness etc. at this time.  Past Medical History:  Diagnosis Date   Asthma    Carotid stenosis 09/12/2019   Claudication in peripheral vascular disease ([place]) 11/12/6193   Complication of anesthesia    " i HAVE A HARD TIME WAKING UP "   Hypertension    PFO with atrial septal aneurysm    by echo 11/2017   [name] (transient ischemic attack) 11/2017   Tobacco abuse     Past Surgical History:  Procedure Laterality Date   COLONOSCOPY     FINGER SURGERY      Family History  Problem Relation Age of Onset   Hypertension Mother    Colon cancer Father    Stomach cancer Brother     SOCIAL HISTORY: Social History   Socioeconomic History   Marital status: Divorced    Spouse name: Not on file   Number of children: Not on file   Years of education: Not on file   Highest education level: Not on file  Occupational History   Not on file  Tobacco Use   Smoking status: Former Smoker    Packs/day: 1.00    Types: Cigarettes    Quit date: 07/20/2019    Years since quitting: 0.6   Smokeless tobacco: Never Used  Vaping Use   Vaping Use: Never used  Substance and Sexual Activity   Alcohol use: Yes    Alcohol/week: 6.0 standard drinks    Types: 6 Cans of beer per week   Drug use: No   Sexual activity: Not Currently  Other Topics Concern   Not on file  Social History Narrative   Married   Works in Architect   Social Determinants of Health   Financial Resource Strain: Low Risk    Difficulty of Paying Living Expenses: Not hard at all  Food Insecurity: No Food Insecurity   Worried About Charity fundraiser in the Last Year: Never true   Arboriculturist in the Last Year: Never true  Transportation Needs: No Transportation Needs   Lack of Transportation (Medical): No   Lack of Transportation (Non-Medical): No  Physical Activity:    Days of Exercise per Week: Not on file   Minutes of Exercise per Session: Not on file  Stress: No Stress Concern Present   Feeling of Stress : Not at all  Social Connections:    Frequency of Communication with Friends and Family: Not on file   Frequency of Social Gatherings with Friends and Family: Not on file   Attends Religious Services: Not on file   Active Member of Clubs or Organizations: Not on file   Attends Archivist Meetings: Not on file   Marital Status: Not on file  Intimate Partner Violence:    Fear of Current or Ex-Partner: Not on file   Emotionally Abused: Not on file   Physically Abused: Not  on file   Sexually Abused: Not on file    No Known Allergies  Current Outpatient Medications  Medication Sig Dispense Refill   amLODipine (NORVASC) 10 MG tablet Take 1 tablet (10 mg total) by mouth daily. 30 tablet 3   aspirin EC 81 MG tablet Take 81 mg by mouth daily.     hydrALAZINE (APRESOLINE) 100 MG tablet Take 1 tablet (100 mg total) by mouth 3 (three) times daily. 90 tablet 3   atorvastatin (LIPITOR) 40 MG tablet Take 1 tablet (40 mg total) by mouth daily. 90 tablet 3   doxazosin (CARDURA) 8 MG tablet Take 1 tablet (8 mg total) by mouth daily. (Patient not taking: Reported on 03/19/2020) 90 tablet 1   furosemide (LASIX) 20 MG tablet Take 20 mg by mouth 2 (two) times daily. (Patient not taking: Reported on 03/19/2020)     HYDROcodone-acetaminophen (NORCO/VICODIN) 5-325 MG tablet Take 1-2  tablets by mouth every 6 (six) hours as needed for severe pain. (Patient not taking: Reported on 03/19/2020) 8 tablet 0   isosorbide mononitrate (IMDUR) 30 MG 24 hr tablet Take 1 tablet (30 mg total) by mouth daily. 90 tablet 3   nitroGLYCERIN (NITROSTAT) 0.4 MG SL tablet Place 1 tablet (0.4 mg total) under the tongue every 5 (five) minutes x 3 doses as needed for chest pain. (Patient not taking: Reported on 03/19/2020) 30 tablet 0   Vitamin D, Ergocalciferol, (DRISDOL) 1.25 MG (50000 UNIT) CAPS capsule Take 50,000 Units by mouth every 7 (seven) days. (Patient not taking: Reported on 03/19/2020)     No current facility-administered medications for this visit.    REVIEW OF SYSTEMS:  [X]  denotes positive finding, [ ]  denotes negative finding Cardiac  Comments:  Chest pain or chest pressure:    Shortness of breath upon exertion:    Short of breath when lying flat:    Irregular heart rhythm:        Vascular    Pain in calf, thigh, or hip brought on by ambulation:    Pain in feet at night that wakes you up from your sleep:     Blood clot in your veins:    Leg swelling:         Pulmonary    Oxygen at home:    Productive cough:     Wheezing:         Neurologic    Sudden weakness in arms or legs:     Sudden numbness in arms or legs:     Sudden onset of difficulty speaking or slurred speech:    Temporary loss of vision in one eye:     Problems with dizziness:         Gastrointestinal    Blood in stool:     Vomited blood:         Genitourinary    Burning when urinating:     Blood in urine:        Psychiatric    Major depression:         Hematologic    Bleeding problems:    Problems with blood clotting too easily:        Skin    Rashes or ulcers:        Constitutional    Fever or chills:      PHYSICAL EXAM: Vitals:   03/19/20 1141  BP: (!) 175/68  Pulse: 80  Resp: 18  Temp: 97.9 F (36.6 C)  TempSrc: Temporal  SpO2: 97%  Weight:  158 lb (71.7 kg)  Height: 5'  8" (1.727 m)    GENERAL: The patient is a well-nourished male, in no acute distress. The vital signs are documented above. CARDIAC: There is a regular rate and rhythm.  VASCULAR:  Palpable radial and brachial pulses bilateral upper extremities No upper extremity tissue loss PULMONARY: There is good air exchange bilaterally without wheezing or rales. ABDOMEN: Soft and non-tender. MUSCULOSKELETAL: There are no major deformities or cyanosis. NEUROLOGIC: No focal weakness or paresthesias are detected. SKIN: There are no ulcers or rashes noted. PSYCHIATRIC: The patient has a normal affect.  DATA:   Vein mapping on the left arm (non-dominant) shows a segment of small cephalic vein in the proximal upper arm but the basilic vein looks usable and >3 mm  Assessment/Plan:  64 year old male with stage V chronic kidney disease that presents for evaluation of permanent dialysis access.  Discussed that typical recommendations are placement in the nondominant arm which would be his left arm.  Discussed based on review of vein mapping likely basilic vein which is normally done in two stages.  I also discussed that I will evaluate both the cephalic and basilic vein in the OR to look for other options.  Discussed risk and benefits including risk of bleeding, infection, failure to mature, steal syndrome.  Discussed access often requires secondary interventions in the future to maintain patency.  Look forward to getting him scheduled at his convenience.  All questions were answered.   [name], MD [facility] Office: [phone]

## 2020-03-26 ENCOUNTER — Other Ambulatory Visit (HOSPITAL_COMMUNITY)
Admission: RE | Admit: 2020-03-26 | Discharge: 2020-03-26 | Disposition: A | Discharge status: Home / Self Care | Payer: Medicare Other | Source: Ambulatory Visit | Attending: Vascular Surgery | Admitting: Vascular Surgery

## 2020-03-26 DIAGNOSIS — Z20822 Contact with and (suspected) exposure to covid-19: Secondary | ICD-10-CM | POA: Insufficient documentation

## 2020-03-26 DIAGNOSIS — Z01812 Encounter for preprocedural laboratory examination: Secondary | ICD-10-CM | POA: Diagnosis present

## 2020-03-26 LAB — SARS CORONAVIRUS 2 (TAT 6-24 HRS): SARS Coronavirus 2: NEGATIVE

## 2020-03-27 ENCOUNTER — Other Ambulatory Visit: Payer: Self-pay

## 2020-03-27 ENCOUNTER — Encounter (HOSPITAL_COMMUNITY): Payer: Self-pay | Admitting: Vascular Surgery

## 2020-03-27 NOTE — Progress Notes (Signed)
Patient denies shortness of breath, fever, cough or chest pain.  PCP - Juluis Mire, NP Cardiologist - Dr Skeet Latch  Chest x-ray - 07/17/19 (2V) EKG - 08/10/19 Stress Test - n/a ECHO - 07/18/19 Cardiac Cath - n/a  Anesthesia review: Yes  STOP now taking any Aleve, Naproxen, Ibuprofen, Motrin, Advil, Goody's, BC's, all herbal medications, fish oil, and all vitamins.   Coronavirus Screening Covid test on 03/26/20 was negative.    Patient verbalized understanding of instructions that were given via phone.

## 2020-03-27 NOTE — Progress Notes (Addendum)
Anesthesia Chart Review: Same day workup  Follows with cardiology for history of difficult to control hypertension, PAD, HLD.  Last seen by Dr. Oval Linsey 01/11/2020 and per note patient's blood pressure is pretty well controlled when he is compliant with medication regimen of amlodipine, doxazosin, hydralazine, and Imdur.  However, he has a history of noncompliance.  He reported he had not taken his medication yet that morning, BP 186/90.  Note did also state that he has recently completed cessation of smoking.  He is also followed for history of PAD with total occlusion in the right and left SFA with present 50% stenosis in the right and left CIA and EIA.  He did not tolerate Pletal.  Intervention has not been pursued due to his CKD 5.  He has moderate bilateral carotid stenosis by ultrasound April 2021.  This is followed yearly.  Most recent blood pressure in epic from office visit with Dr. Carlis Abbott 03/19/2020 record was 175/68.  CKD 5 not yet on HD, followed by Dr. Posey Pronto.  We will need day of surgery labs and evaluation.  EKG 08/10/2019: Sinus rhythm.  Rate 70.  Left atrial enlargement.  LVH with secondary repolarization normality.  CHEST - 2 VIEW 07/17/2019: COMPARISON:  Chest radiograph dated 11/09/2017.  FINDINGS: There is mild diffuse interstitial coarsening and bronchitic changes. There is mild hyperexpansion of the lungs likely related to COPD. Minimal left lung base atelectasis/scarring. Mild blunting of the costophrenic angles likely related to hyperexpansion or scarring. Small pleural effusions are not excluded. No focal consolidation or pneumothorax. Stable cardiac silhouette. No acute osseous pathology.  IMPRESSION: 1. Chronic hyperinflation, likely related to underlying COPD. 2. Bibasilar atelectasis/scarring versus less likely trace pleural effusions. Clinical correlation recommended. No focal consolidation.   Carotid duplex 09/22/2019: Summary:  Right Carotid: Velocities in  the right ICA are consistent with a 40-59% stenosis. Non-hemodynamically significant plaque <50% noted in the CCA. There has been an increase of ICA stenosis since Prior exam.  Left Carotid: Velocities in the left ICA are consistent with a 40-59% stenosis. Non-hemodynamically significant plaque <50% noted in the CCA. The ECA appears >50% stenosed. There has been an increase of ICA stenosis since prior exam.  Vertebrals: Left vertebral artery demonstrates antegrade flow. Blunted right vertebral arterial waveform, question of a more proximal stenosis.  Subclavians: Right subclavian artery flow was disturbed. Normal flow hemodynamics were seen in the left subclavian artery.   TTE 07/18/2019: 1. Left ventricular ejection fraction, by estimation, is 60 to 65%. The  left ventricle has normal function. The left ventrical has no regional  wall motion abnormalities. There is mildly increased left ventricular  hypertrophy. Left ventricular diastolic  parameters are consistent with Grade II diastolic dysfunction  (pseudonormalization). Elevated left ventricular end-diastolic pressure.  2. Right ventricular systolic function is normal. The right ventricular  size is normal. There is mildly elevated pulmonary artery systolic  pressure. The estimated right ventricular systolic pressure is 37.6 mmHg.  3. Left atrial size was mild to moderately dilated.  4. Mild mitral valve regurgitation.  5. The aortic valve is normal in structure and function. Aortic valve  regurgitation is not visualized. No aortic stenosis is present.  6. The inferior vena cava is dilated in size with <50% respiratory  variability, suggesting right atrial pressure of 15 mmHg.    Wynonia Musty [facility] Short Stay Center/Anesthesiology Phone [phone] 03/27/2020 10:17 AM

## 2020-03-27 NOTE — Anesthesia Preprocedure Evaluation (Addendum)
Anesthesia Evaluation  Patient identified by MRN, date of birth, ID band Patient awake    Reviewed: Allergy & Precautions, NPO status , Patient's Chart, lab work & pertinent test results  Airway Mallampati: II  TM Distance: >3 FB Neck ROM: Full    Dental  (+) Teeth Intact   Pulmonary asthma , former smoker,    Pulmonary exam normal breath sounds clear to auscultation       Cardiovascular hypertension, Pt. on medications + Peripheral Vascular Disease  Normal cardiovascular exam Rhythm:Regular Rate:Normal     Neuro/Psych TIACVA negative psych ROS   GI/Hepatic Neg liver ROS, GERD  ,  Endo/Other  negative endocrine ROS  Renal/GU Renal disease     Musculoskeletal negative musculoskeletal ROS (+)   Abdominal   Peds  Hematology negative hematology ROS (+)   Anesthesia Other Findings Day of surgery medications reviewed with the patient.  Reproductive/Obstetrics                            Anesthesia Physical Anesthesia Plan  ASA: III  Anesthesia Plan: MAC   Post-op Pain Management:    Induction: Intravenous  PONV Risk Score and Plan: 1 and Propofol infusion and Treatment may vary due to age or medical condition  Airway Management Planned: Nasal Cannula and Natural Airway  Additional Equipment:   Intra-op Plan:   Post-operative Plan:   Informed Consent: I have reviewed the patients History and Physical, chart, labs and discussed the procedure including the risks, benefits and alternatives for the proposed anesthesia with the patient or authorized representative who has indicated his/her understanding and acceptance.       Plan Discussed with: CRNA and Anesthesiologist  Anesthesia Plan Comments: (PAT note by Karoline Caldwell, PA-C: Follows with cardiology for history of difficult to control hypertension, PAD, HLD.  Last seen by Dr. Oval Linsey 01/11/2020 and per note patient's blood pressure  is pretty well controlled when he is compliant with medication regimen of amlodipine, doxazosin, hydralazine, and Imdur.  However, he has a history of noncompliance.  He reported he had not taken his medication yet that morning, BP 186/90.  Note did also state that he has recently completed cessation of smoking.  He is also followed for history of PAD with total occlusion in the right and left SFA with present 50% stenosis in the right and left CIA and EIA.  He did not tolerate Pletal.  Intervention has not been pursued due to his CKD 5.  He has moderate bilateral carotid stenosis by ultrasound April 2021.  This is followed yearly.  Most recent blood pressure in epic from office visit with Dr. Carlis Abbott 03/19/2020 record was 175/68.  CKD 5 not yet on HD, followed by Dr. Posey Pronto.   Will need day of surgery labs and evaluation.  EKG 08/10/2019: Sinus rhythm.  Rate 70.  Left atrial enlargement.  LVH with secondary repolarization normality.  CHEST - 2 VIEW 07/17/2019: COMPARISON:  Chest radiograph dated 11/09/2017.  FINDINGS: There is mild diffuse interstitial coarsening and bronchitic changes. There is mild hyperexpansion of the lungs likely related to COPD. Minimal left lung base atelectasis/scarring. Mild blunting of the costophrenic angles likely related to hyperexpansion or scarring. Small pleural effusions are not excluded. No focal consolidation or pneumothorax. Stable cardiac silhouette. No acute osseous pathology.  IMPRESSION: 1. Chronic hyperinflation, likely related to underlying COPD. 2. Bibasilar atelectasis/scarring versus less likely trace pleural effusions. Clinical correlation recommended. No focal consolidation.   Carotid  duplex 09/22/2019: Summary:  Right Carotid: Velocities in the right ICA are consistent with a 40-59% stenosis. Non-hemodynamically significant plaque <50% noted in the CCA. There has been an increase of ICA stenosis since Prior exam.  Left Carotid: Velocities in  the left ICA are consistent with a 40-59% stenosis. Non-hemodynamically significant plaque <50% noted in the CCA. The ECA appears >50% stenosed. There has been an increase of ICA stenosis since prior exam.  Vertebrals: Left vertebral artery demonstrates antegrade flow. Blunted right vertebral arterial waveform, question of a more proximal stenosis.  Subclavians: Right subclavian artery flow was disturbed. Normal flow hemodynamics were seen in the left subclavian artery.   TTE 07/18/2019: 1. Left ventricular ejection fraction, by estimation, is 60 to 65%. The  left ventricle has normal function. The left ventrical has no regional  wall motion abnormalities. There is mildly increased left ventricular  hypertrophy. Left ventricular diastolic  parameters are consistent with Grade II diastolic dysfunction  (pseudonormalization). Elevated left ventricular end-diastolic pressure.  2. Right ventricular systolic function is normal. The right ventricular  size is normal. There is mildly elevated pulmonary artery systolic  pressure. The estimated right ventricular systolic pressure is 22.3 mmHg.  3. Left atrial size was mild to moderately dilated.  4. Mild mitral valve regurgitation.  5. The aortic valve is normal in structure and function. Aortic valve  regurgitation is not visualized. No aortic stenosis is present.  6. The inferior vena cava is dilated in size with <50% respiratory  variability, suggesting right atrial pressure of 15 mmHg.  )      Anesthesia Quick Evaluation

## 2020-03-28 ENCOUNTER — Other Ambulatory Visit: Payer: Self-pay

## 2020-03-28 ENCOUNTER — Encounter (HOSPITAL_COMMUNITY): Payer: Self-pay | Admitting: Vascular Surgery

## 2020-03-28 ENCOUNTER — Ambulatory Visit (HOSPITAL_COMMUNITY): Payer: Medicare Other | Admitting: Physician Assistant

## 2020-03-28 ENCOUNTER — Encounter (HOSPITAL_COMMUNITY)
Admission: RE | Disposition: A | Discharge status: Home / Self Care | Payer: Self-pay | Source: Home / Self Care | Attending: Vascular Surgery

## 2020-03-28 ENCOUNTER — Ambulatory Visit (HOSPITAL_COMMUNITY)
Admission: RE | Admit: 2020-03-28 | Discharge: 2020-03-28 | Disposition: A | Discharge status: Home / Self Care | Payer: Medicare Other | Attending: Vascular Surgery | Admitting: Vascular Surgery

## 2020-03-28 DIAGNOSIS — I70213 Atherosclerosis of native arteries of extremities with intermittent claudication, bilateral legs: Secondary | ICD-10-CM | POA: Insufficient documentation

## 2020-03-28 DIAGNOSIS — J449 Chronic obstructive pulmonary disease, unspecified: Secondary | ICD-10-CM | POA: Diagnosis not present

## 2020-03-28 DIAGNOSIS — I6523 Occlusion and stenosis of bilateral carotid arteries: Secondary | ICD-10-CM | POA: Insufficient documentation

## 2020-03-28 DIAGNOSIS — Z8249 Family history of ischemic heart disease and other diseases of the circulatory system: Secondary | ICD-10-CM | POA: Diagnosis not present

## 2020-03-28 DIAGNOSIS — Z87891 Personal history of nicotine dependence: Secondary | ICD-10-CM | POA: Diagnosis not present

## 2020-03-28 DIAGNOSIS — Z7982 Long term (current) use of aspirin: Secondary | ICD-10-CM | POA: Diagnosis not present

## 2020-03-28 DIAGNOSIS — N185 Chronic kidney disease, stage 5: Secondary | ICD-10-CM | POA: Insufficient documentation

## 2020-03-28 DIAGNOSIS — I132 Hypertensive heart and chronic kidney disease with heart failure and with stage 5 chronic kidney disease, or end stage renal disease: Secondary | ICD-10-CM | POA: Diagnosis not present

## 2020-03-28 DIAGNOSIS — Z79899 Other long term (current) drug therapy: Secondary | ICD-10-CM | POA: Insufficient documentation

## 2020-03-28 DIAGNOSIS — I509 Heart failure, unspecified: Secondary | ICD-10-CM | POA: Diagnosis not present

## 2020-03-28 DIAGNOSIS — Z8673 Personal history of transient ischemic attack (TIA), and cerebral infarction without residual deficits: Secondary | ICD-10-CM | POA: Diagnosis not present

## 2020-03-28 DIAGNOSIS — Q211 Atrial septal defect: Secondary | ICD-10-CM | POA: Insufficient documentation

## 2020-03-28 HISTORY — DX: Cerebral infarction, unspecified: I63.9

## 2020-03-28 HISTORY — PX: AV FISTULA PLACEMENT: SHX1204

## 2020-03-28 HISTORY — DX: Gastro-esophageal reflux disease without esophagitis: K21.9

## 2020-03-28 HISTORY — DX: Hyperlipidemia, unspecified: E78.5

## 2020-03-28 LAB — POCT I-STAT, CHEM 8
BUN: 63 mg/dL — ABNORMAL HIGH (ref 8–23)
Calcium, Ion: 1.14 mmol/L — ABNORMAL LOW (ref 1.15–1.40)
Chloride: 114 mmol/L — ABNORMAL HIGH (ref 98–111)
Creatinine, Ser: 10.5 mg/dL — ABNORMAL HIGH (ref 0.61–1.24)
Glucose, Bld: 101 mg/dL — ABNORMAL HIGH (ref 70–99)
HCT: 30 % — ABNORMAL LOW (ref 39.0–52.0)
Hemoglobin: 10.2 g/dL — ABNORMAL LOW (ref 13.0–17.0)
Potassium: 3.8 mmol/L (ref 3.5–5.1)
Sodium: 143 mmol/L (ref 135–145)
TCO2: 17 mmol/L — ABNORMAL LOW (ref 22–32)

## 2020-03-28 SURGERY — ARTERIOVENOUS (AV) FISTULA CREATION
Anesthesia: Monitor Anesthesia Care | Site: Arm Lower | Laterality: Left

## 2020-03-28 MED ORDER — SODIUM CHLORIDE 0.9 % IV SOLN: INTRAVENOUS | Status: DC

## 2020-03-28 MED ORDER — LABETALOL HCL 5 MG/ML IV SOLN: 10.0000 mg | Freq: Once | INTRAVENOUS | Status: AC

## 2020-03-28 MED ORDER — LIDOCAINE HCL (CARDIAC) PF 100 MG/5ML IV SOSY
PREFILLED_SYRINGE | INTRAVENOUS | Status: DC | PRN
  Administered 2020-03-28: 50 mg via INTRATRACHEAL

## 2020-03-28 MED ORDER — DEXAMETHASONE SODIUM PHOSPHATE 10 MG/ML IJ SOLN
INTRAMUSCULAR | Status: DC | PRN
  Administered 2020-03-28: 10 mg via INTRAVENOUS

## 2020-03-28 MED ORDER — DIPHENHYDRAMINE HCL 50 MG/ML IJ SOLN: 12.5000 mg | Freq: Once | INTRAMUSCULAR | Status: AC

## 2020-03-28 MED ORDER — MIDAZOLAM HCL 2 MG/2ML IJ SOLN
INTRAMUSCULAR | Status: AC
  Administered 2020-03-28: 1 mg via INTRAVENOUS
  Filled 2020-03-28: qty 2

## 2020-03-28 MED ORDER — FENTANYL CITRATE (PF) 100 MCG/2ML IJ SOLN: 50.0000 ug | Freq: Once | INTRAMUSCULAR | Status: AC

## 2020-03-28 MED ORDER — SODIUM CHLORIDE 0.9 % IV SOLN: INTRAVENOUS | Status: DC | PRN

## 2020-03-28 MED ORDER — CHLORHEXIDINE GLUCONATE 4 % EX LIQD: 60.0000 mL | Freq: Once | CUTANEOUS | Status: DC

## 2020-03-28 MED ORDER — CEFAZOLIN SODIUM-DEXTROSE 2-4 GM/100ML-% IV SOLN
2.0000 g | INTRAVENOUS | Status: AC
  Administered 2020-03-28: 2 g via INTRAVENOUS

## 2020-03-28 MED ORDER — CHLORHEXIDINE GLUCONATE 0.12 % MT SOLN
OROMUCOSAL | Status: AC
  Administered 2020-03-28: 15 mL via OROMUCOSAL
  Filled 2020-03-28: qty 15

## 2020-03-28 MED ORDER — FENTANYL CITRATE (PF) 250 MCG/5ML IJ SOLN
INTRAMUSCULAR | Status: AC
  Filled 2020-03-28: qty 5

## 2020-03-28 MED ORDER — FENTANYL CITRATE (PF) 100 MCG/2ML IJ SOLN: 25.0000 ug | INTRAMUSCULAR | Status: DC | PRN

## 2020-03-28 MED ORDER — CEFAZOLIN SODIUM-DEXTROSE 2-4 GM/100ML-% IV SOLN
INTRAVENOUS | Status: AC
  Filled 2020-03-28: qty 100

## 2020-03-28 MED ORDER — MIDAZOLAM HCL 2 MG/2ML IJ SOLN: 1.0000 mg | Freq: Once | INTRAMUSCULAR | Status: AC

## 2020-03-28 MED ORDER — PROPOFOL 10 MG/ML IV BOLUS
INTRAVENOUS | Status: AC
  Filled 2020-03-28: qty 20

## 2020-03-28 MED ORDER — PROPOFOL 10 MG/ML IV BOLUS
INTRAVENOUS | Status: DC | PRN
  Administered 2020-03-28 (×3): 30 mg via INTRAVENOUS

## 2020-03-28 MED ORDER — ORAL CARE MOUTH RINSE: 15.0000 mL | Freq: Once | OROMUCOSAL | Status: AC

## 2020-03-28 MED ORDER — HEPARIN SODIUM (PORCINE) 1000 UNIT/ML IJ SOLN
INTRAMUSCULAR | Status: DC | PRN
  Administered 2020-03-28: 3000 [IU] via INTRAVENOUS

## 2020-03-28 MED ORDER — 0.9 % SODIUM CHLORIDE (POUR BTL) OPTIME
TOPICAL | Status: DC | PRN
  Administered 2020-03-28: 1000 mL

## 2020-03-28 MED ORDER — LABETALOL HCL 5 MG/ML IV SOLN
INTRAVENOUS | Status: AC
  Administered 2020-03-28: 10 mg via INTRAVENOUS
  Filled 2020-03-28: qty 4

## 2020-03-28 MED ORDER — ONDANSETRON HCL 4 MG/2ML IJ SOLN
INTRAMUSCULAR | Status: DC | PRN
  Administered 2020-03-28: 4 mg via INTRAVENOUS

## 2020-03-28 MED ORDER — HYDROCODONE-ACETAMINOPHEN 5-325 MG PO TABS
1.0000 | ORAL_TABLET | Freq: Four times a day (QID) | ORAL | 0 refills | Status: DC | PRN
Start: 2020-03-28 — End: 2020-06-16

## 2020-03-28 MED ORDER — FENTANYL CITRATE (PF) 100 MCG/2ML IJ SOLN
INTRAMUSCULAR | Status: AC
  Administered 2020-03-28: 50 ug via INTRAVENOUS
  Filled 2020-03-28: qty 2

## 2020-03-28 MED ORDER — HEPARIN SODIUM (PORCINE) 1000 UNIT/ML IJ SOLN
INTRAMUSCULAR | Status: AC
  Filled 2020-03-28: qty 1

## 2020-03-28 MED ORDER — ONDANSETRON HCL 4 MG/2ML IJ SOLN: 4.0000 mg | Freq: Once | INTRAMUSCULAR | Status: DC | PRN

## 2020-03-28 MED ORDER — DIPHENHYDRAMINE HCL 50 MG/ML IJ SOLN
INTRAMUSCULAR | Status: AC
  Administered 2020-03-28: 12.5 mg via INTRAVENOUS
  Filled 2020-03-28: qty 1

## 2020-03-28 MED ORDER — SODIUM CHLORIDE 0.9 % IV SOLN
INTRAVENOUS | Status: AC
  Filled 2020-03-28: qty 1.2

## 2020-03-28 MED ORDER — ROPIVACAINE HCL 7.5 MG/ML IJ SOLN
INTRAMUSCULAR | Status: DC | PRN
  Administered 2020-03-28: 25 mL via PERINEURAL

## 2020-03-28 MED ORDER — CHLORHEXIDINE GLUCONATE 0.12 % MT SOLN: 15.0000 mL | Freq: Once | OROMUCOSAL | Status: AC

## 2020-03-28 MED ORDER — MIDAZOLAM HCL 2 MG/2ML IJ SOLN
INTRAMUSCULAR | Status: AC
  Filled 2020-03-28: qty 2

## 2020-03-28 MED ORDER — SODIUM CHLORIDE 0.9 % IV SOLN
INTRAVENOUS | Status: DC | PRN
  Administered 2020-03-28: 500 mL

## 2020-03-28 SURGICAL SUPPLY — 36 items
ADH SKN CLS APL DERMABOND .7 (GAUZE/BANDAGES/DRESSINGS) ×1
AGENT HMST SPONGE THK3/8 (HEMOSTASIS)
ARMBAND PINK RESTRICT EXTREMIT (MISCELLANEOUS) ×6 IMPLANT
BLADE CLIPPER SURG (BLADE) ×3 IMPLANT
CANISTER SUCT 3000ML PPV (MISCELLANEOUS) ×3 IMPLANT
CLIP VESOCCLUDE MED 6/CT (CLIP) ×3 IMPLANT
CLIP VESOCCLUDE SM WIDE 6/CT (CLIP) ×3 IMPLANT
COVER PROBE W GEL 5X96 (DRAPES) ×3 IMPLANT
COVER WAND RF STERILE (DRAPES) ×1 IMPLANT
DECANTER SPIKE VIAL GLASS SM (MISCELLANEOUS) ×1 IMPLANT
DERMABOND ADVANCED (GAUZE/BANDAGES/DRESSINGS) ×2
DERMABOND ADVANCED .7 DNX12 (GAUZE/BANDAGES/DRESSINGS) ×1 IMPLANT
ELECT REM PT RETURN 9FT ADLT (ELECTROSURGICAL) ×3
ELECTRODE REM PT RTRN 9FT ADLT (ELECTROSURGICAL) ×1 IMPLANT
GLOVE BIO SURGEON STRL SZ7.5 (GLOVE) ×3 IMPLANT
GLOVE BIOGEL PI IND STRL 8 (GLOVE) ×1 IMPLANT
GLOVE BIOGEL PI INDICATOR 8 (GLOVE) ×2
GOWN STRL REUS W/ TWL LRG LVL3 (GOWN DISPOSABLE) ×2 IMPLANT
GOWN STRL REUS W/ TWL XL LVL3 (GOWN DISPOSABLE) ×2 IMPLANT
GOWN STRL REUS W/TWL LRG LVL3 (GOWN DISPOSABLE) ×6
GOWN STRL REUS W/TWL XL LVL3 (GOWN DISPOSABLE) ×6
HEMOSTAT SPONGE AVITENE ULTRA (HEMOSTASIS) IMPLANT
KIT BASIN OR (CUSTOM PROCEDURE TRAY) ×3 IMPLANT
KIT TURNOVER KIT B (KITS) ×3 IMPLANT
LOOP VESSEL MINI RED (MISCELLANEOUS) ×2 IMPLANT
NS IRRIG 1000ML POUR BTL (IV SOLUTION) ×3 IMPLANT
PACK CV ACCESS (CUSTOM PROCEDURE TRAY) ×3 IMPLANT
PAD ARMBOARD 7.5X6 YLW CONV (MISCELLANEOUS) ×6 IMPLANT
SUT MNCRL AB 4-0 PS2 18 (SUTURE) ×3 IMPLANT
SUT PROLENE 6 0 BV (SUTURE) ×3 IMPLANT
SUT PROLENE 7 0 BV 1 (SUTURE) IMPLANT
SUT VIC AB 3-0 SH 27 (SUTURE) ×3
SUT VIC AB 3-0 SH 27X BRD (SUTURE) ×1 IMPLANT
TOWEL GREEN STERILE (TOWEL DISPOSABLE) ×3 IMPLANT
UNDERPAD 30X36 HEAVY ABSORB (UNDERPADS AND DIAPERS) ×3 IMPLANT
WATER STERILE IRR 1000ML POUR (IV SOLUTION) ×3 IMPLANT

## 2020-03-28 NOTE — Op Note (Addendum)
OPERATIVE NOTE   PROCEDURE: left brachiocephalic arteriovenous fistula placement  PRE-OPERATIVE DIAGNOSIS: chronic kidney disease stage 5  POST-OPERATIVE DIAGNOSIS: same as above   SURGEON: [name], MD  ASSISTANT(S): Arlee Muslim, PA  ANESTHESIA: regional  ESTIMATED BLOOD LOSS: <25 mL  FINDING(S): 1.  Cephalic vein: 3-4 mm, acceptable 2.  Brachial artery: 5 mm, atherosclerotic disease evident 3.  Venous outflow: palpable thrill  4.  Radial flow: dopplerable radial signal  SPECIMEN(S):  none  INDICATIONS:   Albert Harris is a 64 y.o. male who presents with stage 5 CKD and need for permanent dialysis access.  The patient is scheduled for left arm arteriovenous fistula placement.  The patient is aware the risks include but are not limited to: bleeding, infection, steal syndrome, nerve damage, ischemic monomelic neuropathy, failure to mature, and need for additional procedures.  The patient is aware of the risks of the procedure and elects to proceed forward.  An assistant was needed for exposure and to expedite the case.   DESCRIPTION: After full informed written consent was obtained from the patient, the patient was brought back to the operating room and placed supine upon the operating table.  Prior to induction, the patient received IV antibiotics.   After obtaining adequate anesthesia, the patient was then prepped and draped in the standard fashion for a left arm access procedure.  I turned my attention first to identifying the patient's cephalic vein and brachial artery.  Using SonoSite guidance, the location of these vessels were marked out on the skin.   The cephalic vein looked adequate for fistula creation.  I made a transverse incision below the level of the antecubitum and dissected through the subcutaneous tissue and fascia to gain exposure of the brachial artery.  This was noted to be 5 mm in diameter externally.  This was dissected out proximally and  distally and controlled with vessel loops .  I then dissected out the cephalic vein.  This was noted to be 3-4 mm in diameter externally.  The distal segment of the vein was ligated with a  2-0 silk, and the vein was transected.  The basilic vein appeared to branch off just above where we transected the vein in a bifurcated system.  The proximal segment was interrogated with serial dilators.  The vein accepted up to a 4.5 mm dilator without any difficulty.  I then instilled the heparinized saline into the vein and clamped it.  At this point, I reset my exposure of the brachial artery.  The patient was given 3000 units IV heparin.  I then placed the artery under tension proximally and distally.  I made an arteriotomy with a #11 blade, and then I extended the arteriotomy with a Potts scissor.  I injected heparinized saline proximal and distal to this arteriotomy.  The vein was then sewn to the artery in an end-to-side configuration with a running stitch of 6-0 Prolene.  Prior to completing this anastomosis, I allowed the vein and artery to backbleed.  There was no evidence of clot from any vessels.  I completed the anastomosis in the usual fashion and then released all vessel loops and clamps.    Initially there was a very weak monophasic signal at the wrist.  I then elected to place a 2-0 silk to ligation the large basilic branch off the cephalic vein to improve flow to the hand.  After this there was a palpable thrill in the venous outflow, and there was a  dopplerable radial signal.  At this point, I irrigated out the surgical wound.  There was no further active bleeding.  The subcutaneous tissue was reapproximated with a running stitch of 3-0 Vicryl.  The skin was then reapproximated with a running subcuticular stitch of 4-0 Monocryl.  The skin was then cleaned, dried, and reinforced with Dermabond.  The patient tolerated this procedure well.   COMPLICATIONS: None  CONDITION: Stable  [name],  MD [facility] Office: [place]   03/28/2020, 10:34 AM

## 2020-03-28 NOTE — H&P (Signed)
History and Physical Interval Note:  03/28/2020 9:13 AM  [name]  has presented today for surgery, with the diagnosis of ESRD.  The various methods of treatment have been discussed with the patient and family. After consideration of risks, benefits and other options for treatment, the patient has consented to  Procedure(s): ARTERIOVENOUS (AV) FISTULA CREATION VS GRAFT LEFT (Left) as a surgical intervention.  [name] history has been reviewed, patient examined, no change in status, stable for surgery.  I have reviewed the patient's chart and labs.  Questions were answered to the patient's satisfaction.    Left arm AVF  [name]  Patient name: Albert Harris          MRN: [id number]        DOB: [birth year]        Sex: male  REASON FOR CONSULT: Evaluate for permanent dialysis access  HPI: Albert Harris is a 64 y.o. male, with history of stage V chronic kidney disease, COPD, CHF, tobacco abuse, hypertension that presents for evaluation of permanent dialysis access.  Patient states he is not on dialysis at this time.  He has had no previous dialysis access in the past.  No chest wall implants.  Patient is right-handed.  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  No upper extremity numbness weakness etc. at this time.      Past Medical History:  Diagnosis Date  . Asthma   . Carotid stenosis 09/12/2019  . Claudication in peripheral vascular disease ([place]) 09/12/2019  . Complication of anesthesia    " i HAVE A HARD TIME WAKING UP "  . Hypertension   . PFO with atrial septal aneurysm    by echo 11/2017  . [name] (transient ischemic attack) 11/2017  . Tobacco abuse          Past Surgical History:  Procedure Laterality Date  . COLONOSCOPY    . FINGER SURGERY           Family History  Problem Relation Age of Onset  . Hypertension Mother   . Colon cancer Father   . Stomach cancer Brother     SOCIAL HISTORY: Social History         Socioeconomic History  . Marital status: Divorced    Spouse name: Not on file  . Number of children: Not on file  . Years of education: Not on file  . Highest education level: Not on file  Occupational History  . Not on file  Tobacco Use  . Smoking status: Former Smoker    Packs/day: 1.00    Types: Cigarettes    Quit date: 07/20/2019    Years since quitting: 0.6  . Smokeless tobacco: Never Used  Vaping Use  . Vaping Use: Never used  Substance and Sexual Activity  . Alcohol use: Yes    Alcohol/week: 6.0 standard drinks    Types: 6 Cans of beer per week  . Drug use: No  . Sexual activity: Not Currently  Other Topics Concern  . Not on file  Social History Narrative   Married   Works in Audiological scientist   Social Determinants of Health      Financial Resource Strain: Low Risk   . Difficulty of Paying Living Expenses: Not hard at all  Food Insecurity: No Food Insecurity  . Worried About Charity fundraiser in the Last Year: Never true  . Ran Out of Food in the Last  Year: Never true  Transportation Needs: No Transportation Needs  . Lack of Transportation (Medical): No  . Lack of Transportation (Non-Medical): No  Physical Activity:   . Days of Exercise per Week: Not on file  . Minutes of Exercise per Session: Not on file  Stress: No Stress Concern Present  . Feeling of Stress : Not at all  Social Connections:   . Frequency of Communication with Friends and Family: Not on file  . Frequency of Social Gatherings with Friends and Family: Not on file  . Attends Religious Services: Not on file  . Active Member of Clubs or Organizations: Not on file  . Attends Archivist Meetings: Not on file  . Marital Status: Not on file  Intimate Partner Violence:   . Fear of Current or Ex-Partner: Not on file  . Emotionally Abused: Not on file  . Physically Abused: Not on file  . Sexually Abused: Not on file    No Known Allergies         Current Outpatient Medications  Medication Sig Dispense Refill  . amLODipine (NORVASC) 10 MG tablet Take 1 tablet (10 mg total) by mouth daily. 30 tablet 3  . aspirin EC 81 MG tablet Take 81 mg by mouth daily.    . hydrALAZINE (APRESOLINE) 100 MG tablet Take 1 tablet (100 mg total) by mouth 3 (three) times daily. 90 tablet 3  . atorvastatin (LIPITOR) 40 MG tablet Take 1 tablet (40 mg total) by mouth daily. 90 tablet 3  . doxazosin (CARDURA) 8 MG tablet Take 1 tablet (8 mg total) by mouth daily. (Patient not taking: Reported on 03/19/2020) 90 tablet 1  . furosemide (LASIX) 20 MG tablet Take 20 mg by mouth 2 (two) times daily. (Patient not taking: Reported on 03/19/2020)    . HYDROcodone-acetaminophen (NORCO/VICODIN) 5-325 MG tablet Take 1-2 tablets by mouth every 6 (six) hours as needed for severe pain. (Patient not taking: Reported on 03/19/2020) 8 tablet 0  . isosorbide mononitrate (IMDUR) 30 MG 24 hr tablet Take 1 tablet (30 mg total) by mouth daily. 90 tablet 3  . nitroGLYCERIN (NITROSTAT) 0.4 MG SL tablet Place 1 tablet (0.4 mg total) under the tongue every 5 (five) minutes x 3 doses as needed for chest pain. (Patient not taking: Reported on 03/19/2020) 30 tablet 0  . Vitamin D, Ergocalciferol, (DRISDOL) 1.25 MG (50000 UNIT) CAPS capsule Take 50,000 Units by mouth every 7 (seven) days. (Patient not taking: Reported on 03/19/2020)     No current facility-administered medications for this visit.    REVIEW OF SYSTEMS:  [X]  denotes positive finding, [ ]  denotes negative finding Cardiac  Comments:  Chest pain or chest pressure:    Shortness of breath upon exertion:    Short of breath when lying flat:    Irregular heart rhythm:        Vascular    Pain in calf, thigh, or hip brought on by ambulation:    Pain in feet at night that wakes you up from your sleep:     Blood clot in your veins:    Leg swelling:         Pulmonary    Oxygen at home:     Productive cough:     Wheezing:         Neurologic    Sudden weakness in arms or legs:     Sudden numbness in arms or legs:     Sudden onset of difficulty speaking or slurred  speech:    Temporary loss of vision in one eye:     Problems with dizziness:         Gastrointestinal    Blood in stool:     Vomited blood:         Genitourinary    Burning when urinating:     Blood in urine:        Psychiatric    Major depression:         Hematologic    Bleeding problems:    Problems with blood clotting too easily:        Skin    Rashes or ulcers:        Constitutional    Fever or chills:      PHYSICAL EXAM:    Vitals:   03/19/20 1141  BP: (!) 175/68  Pulse: 80  Resp: 18  Temp: 97.9 F (36.6 C)  TempSrc: Temporal  SpO2: 97%  Weight: 158 lb (71.7 kg)  Height: 5\' 8"  (1.727 m)    GENERAL: The patient is a well-nourished male, in no acute distress. The vital signs are documented above. CARDIAC: There is a regular rate and rhythm.  VASCULAR:  Palpable radial and brachial pulses bilateral upper extremities No upper extremity tissue loss PULMONARY: There is good air exchange bilaterally without wheezing or rales. ABDOMEN: Soft and non-tender. MUSCULOSKELETAL: There are no major deformities or cyanosis. NEUROLOGIC: No focal weakness or paresthesias are detected. SKIN: There are no ulcers or rashes noted. PSYCHIATRIC: The patient has a normal affect.  DATA:   Vein mapping on the left arm (non-dominant) shows a segment of small cephalic vein in the proximal upper arm but the basilic vein looks usable and >3 mm  Assessment/Plan:  64 year old male with stage V chronic kidney disease that presents for evaluation of permanent dialysis access.  Discussed that typical recommendations are placement in the nondominant arm which would be his left arm.  Discussed based on review of vein mapping likely  basilic vein which is normally done in two stages.  I also discussed that I will evaluate both the cephalic and basilic vein in the OR to look for other options.  Discussed risk and benefits including risk of bleeding, infection, failure to mature, steal syndrome.  Discussed access often requires secondary interventions in the future to maintain patency.  Look forward to getting him scheduled at his convenience.  All questions were answered.   [name], MD [facility] [place] Office: [phone]

## 2020-03-28 NOTE — Anesthesia Postprocedure Evaluation (Signed)
Anesthesia Post Note  Patient: Albert Harris  Procedure(s) Performed: ARTERIOVENOUS (AV) FISTULA CREATION LEFT (Left Arm Lower)     Patient location during evaluation: PACU Anesthesia Type: MAC and Regional Level of consciousness: awake and alert Pain management: pain level controlled Vital Signs Assessment: post-procedure vital signs reviewed and stable Respiratory status: spontaneous breathing, nonlabored ventilation and respiratory function stable Cardiovascular status: stable and blood pressure returned to baseline Postop Assessment: no apparent nausea or vomiting Anesthetic complications: no   No complications documented.  Last Vitals:  Vitals:   03/28/20 1130 03/28/20 1145  BP: (!) 147/80   Pulse: 73   Resp: 16 17  Temp: 36.6 C   SpO2: 97% 100%    Last Pain:  Vitals:   03/28/20 1145  TempSrc:   PainSc: 0-No pain                 Catalina Gravel

## 2020-03-28 NOTE — Discharge Instructions (Signed)
° °  [facility] [place] ° °Discharge Instructions ° °AV Fistula or Graft Surgery for Dialysis Access ° °Please refer to the following instructions for your post-procedure care. Your surgeon or physician assistant will discuss any changes with you. ° °Activity ° °You may drive the day following your surgery, if you are comfortable and no longer taking prescription pain medication. Resume full activity as the soreness in your incision resolves. ° °Bathing/Showering ° °You may shower after you go home. Keep your incision dry for 48 hours. Do not soak in a bathtub, hot tub, or swim until the incision heals completely. You may not shower if you have a hemodialysis catheter. ° °Incision Care ° °Clean your incision with mild soap and water after 48 hours. Pat the area dry with a clean towel. You do not need a bandage unless otherwise instructed. Do not apply any ointments or creams to your incision. You may have skin glue on your incision. Do not peel it off. It will come off on its own in about one week. Your arm may swell a bit after surgery. To reduce swelling use pillows to elevate your arm so it is above your heart. Your doctor will tell you if you need to lightly wrap your arm with an ACE bandage. ° °Diet ° °Resume your normal diet. There are not special food restrictions following this procedure. In order to heal from your surgery, it is CRITICAL to get adequate nutrition. Your body requires vitamins, minerals, and protein. Vegetables are the best source of vitamins and minerals. Vegetables also provide the perfect balance of protein. Processed food has little nutritional value, so try to avoid this. ° °Medications ° °Resume taking all of your medications. If your incision is causing pain, you may take over-the counter pain relievers such as acetaminophen (Tylenol). If you were prescribed a stronger pain medication, please be aware these medications can cause nausea and constipation. Prevent  nausea by taking the medication with a snack or meal. Avoid constipation by drinking plenty of fluids and eating foods with high amount of fiber, such as fruits, vegetables, and grains. Do not take Tylenol if you are taking prescription pain medications. ° ° ° ° °Follow up °Your surgeon may want to see you in the office following your access surgery. If so, this will be arranged at the time of your surgery. ° °Please call us immediately for any of the following conditions: ° °Increased pain, redness, drainage (pus) from your incision site °Fever of 101 degrees or higher °Severe or worsening pain at your incision site °Hand pain or numbness. ° °Reduce your risk of vascular disease: ° °Stop smoking. If you would like help, call [facility] at 1-800-QUIT-NOW (1-800-784-8669) or [place] at 336-586-4000 ° °Manage your cholesterol °Maintain a desired weight °Control your diabetes °Keep your blood pressure down ° °Dialysis ° °It will take several weeks to several months for your new dialysis access to be ready for use. Your surgeon will determine when it is OK to use it. Your nephrologist will continue to direct your dialysis. You can continue to use your Permcath until your new access is ready for use. ° °If you have any questions, please call the office at [phone]. ° °

## 2020-03-28 NOTE — Transfer of Care (Signed)
Immediate Anesthesia Transfer of Care Note  Patient: Albert Harris  Procedure(s) Performed: ARTERIOVENOUS (AV) FISTULA CREATION LEFT (Left Arm Lower)  Patient Location: PACU  Anesthesia Type:MAC combined with regional for post-op pain  Level of Consciousness: awake, alert , oriented and patient cooperative  Airway & Oxygen Therapy: Patient Spontanous Breathing  Post-op Assessment: Report given to RN, Post -op Vital signs reviewed and stable and Patient moving all extremities X 4  Post vital signs: Reviewed and stable  Last Vitals:  Vitals Value Taken Time  BP 146/81 03/28/20 1047  Temp    Pulse 73 03/28/20 1048  Resp 20 03/28/20 1048  SpO2 97 % 03/28/20 1048  Vitals shown include unvalidated device data.  Last Pain:  Vitals:   03/28/20 0845  TempSrc:   PainSc: 0-No pain         Complications: No complications documented.

## 2020-03-28 NOTE — Progress Notes (Signed)
Late entry - patient complains of left arm itching after being clipped and wiped with CHG wipes.  Dr. Gifford Shave notified.  Received verbal order for 12.5mg  benadryl IV.  Dr. Carlis Abbott notified that patient's arm was red.

## 2020-03-28 NOTE — Anesthesia Procedure Notes (Signed)
Performed by: Claris Che, CRNA

## 2020-03-28 NOTE — OR Nursing (Addendum)
Pt is awake,alert and oriented. Pt is in NAD at this time. Pt and family verbalized understanding of poc and discharge instructions. instructions given to family and reviewed prior to discharge.Pt is ambulatory to wheelchair with stand by assist but not needed with steady gait. Awaiting sling from ortho, instructions given on regional block and follow up pain meds.  Thrill/bruit present, + cap refill, arm is warm and dry.

## 2020-03-28 NOTE — Progress Notes (Signed)
Orthopedic Tech Progress Note Patient Details:  Albert Harris 10/24/1955 [id number]  Ortho Devices Type of Ortho Device: Arm sling Ortho Device/Splint Location: Left Upper Extremity Ortho Device/Splint Interventions: Ordered, Application   Post Interventions Patient Tolerated: Well Instructions Provided: Care of device, Adjustment of device, Poper ambulation with device   Rutilio Yellowhair Allie Dimmer 03/28/2020, 12:01 PM

## 2020-03-28 NOTE — OR Nursing (Signed)
Pt has sling in place, educated to only wear when walking, once block wears off pt to remove sling. Pt in wheelchair to lobby

## 2020-03-28 NOTE — Anesthesia Procedure Notes (Signed)
Procedure Name: MAC Date/Time: 03/28/2020 9:28 AM Performed by: Claris Che, CRNA Pre-anesthesia Checklist: Patient identified, Emergency Drugs available, Suction available, Patient being monitored and Timeout performed Patient Re-evaluated:Patient Re-evaluated prior to induction Oxygen Delivery Method: Simple face mask Dental Injury: Teeth and Oropharynx as per pre-operative assessment

## 2020-03-28 NOTE — Anesthesia Procedure Notes (Signed)
Anesthesia Regional Block: Supraclavicular block   Pre-Anesthetic Checklist: ,, timeout performed, Correct Patient, Correct Site, Correct Laterality, Correct Procedure, Correct Position, site marked, Risks and benefits discussed,  Surgical consent,  Pre-op evaluation,  At surgeon's request and post-op pain management  Laterality: Left  Prep: chloraprep       Needles:  Injection technique: Single-shot  Needle Type: Echogenic Needle     Needle Length: 9cm  Needle Gauge: 21     Additional Needles:   Procedures:,,,, ultrasound used (permanent image in chart),,,,  Narrative:  Start time: 03/28/2020 8:34 AM End time: 03/28/2020 8:39 AM Injection made incrementally with aspirations every 5 mL.  Performed by: Personally  Anesthesiologist: Catalina Gravel, MD  Additional Notes: No pain on injection. No increased resistance to injection. Injection made in 5cc increments.  Good needle visualization.  Patient tolerated procedure well.

## 2020-03-29 ENCOUNTER — Encounter (HOSPITAL_COMMUNITY): Payer: Self-pay | Admitting: Vascular Surgery

## 2020-04-01 ENCOUNTER — Other Ambulatory Visit: Payer: Self-pay

## 2020-04-01 ENCOUNTER — Encounter (HOSPITAL_COMMUNITY)
Admission: RE | Admit: 2020-04-01 | Discharge: 2020-04-01 | Disposition: A | Discharge status: Home / Self Care | Payer: Medicare Other | Source: Ambulatory Visit | Attending: Nephrology | Admitting: Nephrology

## 2020-04-01 VITALS — BP 155/76 | HR 81

## 2020-04-01 DIAGNOSIS — N183 Chronic kidney disease, stage 3 unspecified: Secondary | ICD-10-CM | POA: Diagnosis not present

## 2020-04-01 LAB — MAGNESIUM: Magnesium: 2.8 mg/dL — ABNORMAL HIGH (ref 1.7–2.4)

## 2020-04-01 LAB — FERRITIN: Ferritin: 91 ng/mL (ref 24–336)

## 2020-04-01 LAB — RENAL FUNCTION PANEL
Albumin: 3 g/dL — ABNORMAL LOW (ref 3.5–5.0)
Anion gap: 14 (ref 5–15)
BUN: 75 mg/dL — ABNORMAL HIGH (ref 8–23)
CO2: 15 mmol/L — ABNORMAL LOW (ref 22–32)
Calcium: 8.6 mg/dL — ABNORMAL LOW (ref 8.9–10.3)
Chloride: 114 mmol/L — ABNORMAL HIGH (ref 98–111)
Creatinine, Ser: 10.02 mg/dL — ABNORMAL HIGH (ref 0.61–1.24)
GFR, Estimated: 5 mL/min — ABNORMAL LOW (ref >60)
Glucose, Bld: 91 mg/dL (ref 70–99)
Phosphorus: 8 mg/dL — ABNORMAL HIGH (ref 2.5–4.6)
Potassium: 3.9 mmol/L (ref 3.5–5.1)
Sodium: 143 mmol/L (ref 135–145)

## 2020-04-01 LAB — IRON AND TIBC
Iron: 96 ug/dL (ref 45–182)
Saturation Ratios: 35 % (ref 17.9–39.5)
TIBC: 276 ug/dL (ref 250–450)
UIBC: 180 ug/dL

## 2020-04-01 LAB — POCT HEMOGLOBIN-HEMACUE: Hemoglobin: 11 g/dL — ABNORMAL LOW (ref 13.0–17.0)

## 2020-04-01 MED ORDER — EPOETIN ALFA-EPBX 10000 UNIT/ML IJ SOLN
10000.0000 [IU] | INTRAMUSCULAR | Status: DC
  Administered 2020-04-01: 10000 [IU] via SUBCUTANEOUS

## 2020-04-01 MED ORDER — EPOETIN ALFA-EPBX 10000 UNIT/ML IJ SOLN
INTRAMUSCULAR | Status: AC
  Filled 2020-04-01: qty 1

## 2020-04-02 LAB — PTH, INTACT AND CALCIUM
Calcium, Total (PTH): 8.4 mg/dL — ABNORMAL LOW (ref 8.6–10.2)
PTH: 93 pg/mL — ABNORMAL HIGH (ref 15–65)

## 2020-04-17 ENCOUNTER — Ambulatory Visit (HOSPITAL_COMMUNITY)
Admission: RE | Admit: 2020-04-17 | Discharge: 2020-04-17 | Disposition: A | Discharge status: Home / Self Care | Payer: Medicare Other | Source: Ambulatory Visit | Attending: Nephrology | Admitting: Nephrology

## 2020-04-17 ENCOUNTER — Other Ambulatory Visit: Payer: Self-pay

## 2020-04-17 VITALS — BP 176/75 | HR 84 | Temp 98.9°F | Resp 18

## 2020-04-17 DIAGNOSIS — N183 Chronic kidney disease, stage 3 unspecified: Secondary | ICD-10-CM | POA: Insufficient documentation

## 2020-04-17 LAB — POCT HEMOGLOBIN-HEMACUE: Hemoglobin: 9.7 g/dL — ABNORMAL LOW (ref 13.0–17.0)

## 2020-04-17 MED ORDER — EPOETIN ALFA-EPBX 10000 UNIT/ML IJ SOLN: 10000.0000 [IU] | INTRAMUSCULAR | Status: DC

## 2020-04-17 MED ORDER — EPOETIN ALFA-EPBX 10000 UNIT/ML IJ SOLN
INTRAMUSCULAR | Status: AC
  Administered 2020-04-17: 10000 [IU] via SUBCUTANEOUS
  Filled 2020-04-17: qty 1

## 2020-04-18 ENCOUNTER — Encounter (HOSPITAL_COMMUNITY): Payer: Medicare Other

## 2020-04-23 ENCOUNTER — Other Ambulatory Visit: Payer: Self-pay | Admitting: Nephrology

## 2020-04-23 MED FILL — AMLODIPINE BESYLATE 10 MG T: 10 | 30 days supply | Qty: 30 | Fill #4

## 2020-04-23 MED FILL — FUROSEMIDE 40 MG TAB: 40 | 30 days supply | Qty: 60 | Fill #0

## 2020-04-23 MED FILL — DOXAZOSIN MESYLATE 4 MG TAB: 4 | 30 days supply | Qty: 45 | Fill #1

## 2020-04-23 MED FILL — METOPROLOL TARTRATE 25 MG T: 25 | 30 days supply | Qty: 60 | Fill #0

## 2020-04-29 ENCOUNTER — Ambulatory Visit (HOSPITAL_COMMUNITY)
Admission: RE | Admit: 2020-04-29 | Discharge: 2020-04-29 | Disposition: A | Discharge status: Home / Self Care | Payer: Medicare Other | Source: Ambulatory Visit | Attending: Nephrology | Admitting: Nephrology

## 2020-04-29 ENCOUNTER — Other Ambulatory Visit: Payer: Self-pay

## 2020-04-29 DIAGNOSIS — N183 Chronic kidney disease, stage 3 unspecified: Secondary | ICD-10-CM | POA: Diagnosis present

## 2020-04-29 DIAGNOSIS — D631 Anemia in chronic kidney disease: Secondary | ICD-10-CM | POA: Diagnosis not present

## 2020-04-29 LAB — IRON AND TIBC
Iron: 94 ug/dL (ref 45–182)
Saturation Ratios: 40 % — ABNORMAL HIGH (ref 17.9–39.5)
TIBC: 232 ug/dL — ABNORMAL LOW (ref 250–450)
UIBC: 138 ug/dL

## 2020-04-29 LAB — RENAL FUNCTION PANEL
Albumin: 2.7 g/dL — ABNORMAL LOW (ref 3.5–5.0)
Anion gap: 15 (ref 5–15)
BUN: 59 mg/dL — ABNORMAL HIGH (ref 8–23)
CO2: 15 mmol/L — ABNORMAL LOW (ref 22–32)
Calcium: 8.2 mg/dL — ABNORMAL LOW (ref 8.9–10.3)
Chloride: 112 mmol/L — ABNORMAL HIGH (ref 98–111)
Creatinine, Ser: 11.32 mg/dL — ABNORMAL HIGH (ref 0.61–1.24)
GFR, Estimated: 5 mL/min — ABNORMAL LOW (ref >60)
Glucose, Bld: 93 mg/dL (ref 70–99)
Phosphorus: 11.6 mg/dL — ABNORMAL HIGH (ref 2.5–4.6)
Potassium: 3.6 mmol/L (ref 3.5–5.1)
Sodium: 142 mmol/L (ref 135–145)

## 2020-04-29 LAB — MAGNESIUM: Magnesium: 2.6 mg/dL — ABNORMAL HIGH (ref 1.7–2.4)

## 2020-04-29 LAB — POCT HEMOGLOBIN-HEMACUE: Hemoglobin: 9.9 g/dL — ABNORMAL LOW (ref 13.0–17.0)

## 2020-04-29 LAB — FERRITIN: Ferritin: 74 ng/mL (ref 24–336)

## 2020-04-29 MED ORDER — EPOETIN ALFA-EPBX 10000 UNIT/ML IJ SOLN
10000.0000 [IU] | INTRAMUSCULAR | Status: DC
  Administered 2020-04-29: 10000 [IU] via SUBCUTANEOUS

## 2020-04-29 MED ORDER — EPOETIN ALFA-EPBX 10000 UNIT/ML IJ SOLN
INTRAMUSCULAR | Status: AC
  Filled 2020-04-29: qty 1

## 2020-04-30 LAB — PTH, INTACT AND CALCIUM
Calcium, Total (PTH): 8 mg/dL — ABNORMAL LOW (ref 8.6–10.2)
PTH: 131 pg/mL — ABNORMAL HIGH (ref 15–65)

## 2020-05-09 ENCOUNTER — Other Ambulatory Visit: Payer: Self-pay

## 2020-05-09 DIAGNOSIS — N185 Chronic kidney disease, stage 5: Secondary | ICD-10-CM

## 2020-05-13 ENCOUNTER — Encounter (HOSPITAL_COMMUNITY)
Admission: RE | Admit: 2020-05-13 | Discharge: 2020-05-13 | Disposition: A | Discharge status: Home / Self Care | Payer: Medicare Other | Source: Ambulatory Visit | Attending: Nephrology | Admitting: Nephrology

## 2020-05-13 ENCOUNTER — Other Ambulatory Visit: Payer: Self-pay

## 2020-05-13 VITALS — BP 173/75 | HR 62 | Temp 98.0°F | Resp 18

## 2020-05-13 DIAGNOSIS — N183 Chronic kidney disease, stage 3 unspecified: Secondary | ICD-10-CM | POA: Diagnosis present

## 2020-05-13 LAB — POCT HEMOGLOBIN-HEMACUE: Hemoglobin: 10.3 g/dL — ABNORMAL LOW (ref 13.0–17.0)

## 2020-05-13 MED ORDER — EPOETIN ALFA-EPBX 10000 UNIT/ML IJ SOLN: 10000.0000 [IU] | INTRAMUSCULAR | Status: DC

## 2020-05-13 MED ORDER — EPOETIN ALFA-EPBX 10000 UNIT/ML IJ SOLN
INTRAMUSCULAR | Status: AC
  Administered 2020-05-13: 10000 [IU] via SUBCUTANEOUS
  Filled 2020-05-13: qty 1

## 2020-05-14 ENCOUNTER — Other Ambulatory Visit: Payer: Self-pay

## 2020-05-14 ENCOUNTER — Ambulatory Visit (INDEPENDENT_AMBULATORY_CARE_PROVIDER_SITE_OTHER): Payer: Self-pay | Admitting: Physician Assistant

## 2020-05-14 ENCOUNTER — Ambulatory Visit (HOSPITAL_COMMUNITY)
Admission: RE | Admit: 2020-05-14 | Discharge: 2020-05-14 | Disposition: A | Discharge status: Home / Self Care | Payer: Medicare Other | Source: Ambulatory Visit | Attending: Vascular Surgery | Admitting: Vascular Surgery

## 2020-05-14 VITALS — BP 187/89 | HR 69 | Temp 98.5°F | Resp 20 | Ht 68.0 in | Wt 153.5 lb

## 2020-05-14 DIAGNOSIS — R200 Anesthesia of skin: Secondary | ICD-10-CM

## 2020-05-14 DIAGNOSIS — N185 Chronic kidney disease, stage 5: Secondary | ICD-10-CM

## 2020-05-14 DIAGNOSIS — R202 Paresthesia of skin: Secondary | ICD-10-CM

## 2020-05-14 NOTE — Progress Notes (Signed)
POST OPERATIVE DIALYSIS ACCESS OFFICE NOTE    CC:  F/u for dialysis access surgery  HPI:  This is a 64 y.o. male who is s/p left brachio cephalic AV fistula by Dr. Carlis Abbott on March 19, 2020.  He is now approximately 6+ weeks postoperative.  He describes numbness and tingling of his left hand.  He denies hand pain or inability to use his hand.  He has a history of chronic kidney disease stage V and is not yet requiring hemodialysis.  No Known Allergies  Current Outpatient Medications  Medication Sig Dispense Refill  . albuterol (VENTOLIN HFA) 108 (90 Base) MCG/ACT inhaler Inhale 1-2 puffs into the lungs every 6 (six) hours as needed for wheezing or shortness of breath.    Marland Kitchen amLODipine (NORVASC) 10 MG tablet Take 1 tablet (10 mg total) by mouth daily. 30 tablet 3  . aspirin EC 81 MG tablet Take 81 mg by mouth daily.    Marland Kitchen doxazosin (CARDURA) 8 MG tablet Take 1 tablet (8 mg total) by mouth daily. (Patient taking differently: Take 4 mg by mouth daily. ) 90 tablet 1  . furosemide (LASIX) 20 MG tablet Take 20 mg by mouth daily as needed for fluid.     . hydrALAZINE (APRESOLINE) 100 MG tablet Take 1 tablet (100 mg total) by mouth 3 (three) times daily. (Patient taking differently: Take 100 mg by mouth daily. ) 90 tablet 3  . HYDROcodone-acetaminophen (NORCO) 5-325 MG tablet Take 1 tablet by mouth every 6 (six) hours as needed for moderate pain. 10 tablet 0  . metoprolol tartrate (LOPRESSOR) 25 MG tablet Take 25 mg by mouth 2 (two) times daily.    . nitroGLYCERIN (NITROSTAT) 0.4 MG SL tablet Place 1 tablet (0.4 mg total) under the tongue every 5 (five) minutes x 3 doses as needed for chest pain. (Patient taking differently: Place 0.4 mg under the tongue every 5 (five) minutes as needed for chest pain. ) 30 tablet 0  . sodium bicarbonate 650 MG tablet Take 650 mg by mouth daily as needed for heartburn.    . Vitamin D, Ergocalciferol, (DRISDOL) 1.25 MG (50000 UNIT) CAPS capsule Take 50,000 Units by  mouth every 7 (seven) days.     Marland Kitchen atorvastatin (LIPITOR) 40 MG tablet Take 1 tablet (40 mg total) by mouth daily. (Patient not taking: Reported on 05/14/2020) 90 tablet 3  . isosorbide mononitrate (IMDUR) 30 MG 24 hr tablet Take 1 tablet (30 mg total) by mouth daily. 90 tablet 3   No current facility-administered medications for this visit.     ROS:  See HPI  BP (!) 187/89 (BP Location: Right Arm, Patient Position: Sitting, Cuff Size: Normal)   Pulse 69   Temp 98.5 F (36.9 C) (Temporal)   Resp 20   Ht 5\' 8"  (1.727 m)   Wt 153 lb 8 oz (69.6 kg)   SpO2 98%   BMI 23.34 kg/m    Physical Exam:  General appearance: Well-developed, well-nourished in no apparent distress Cardiac: Rate and rhythm are regular Respiratory: Respirations are nonlabored Incision: Left upper extremity incision is somewhat hypertrophic without signs of infection. Extremities: Dopplerable radial and ulnar pulses.  Fistula with good bruit and thrill. 5/5 hand grip strength. Left hand is slightly cooler than the right.  Dialysis duplex on 05/14/2020 Flow volume 657 mL/min Diameter: 0.42-0.49 cm Depth: 0.22-0.37 cm  Assessment/Plan:   -pt has some numbness and tingling of left hand that is not interfering with activities at this time. Motor  function intact -dialysis duplex today reveals fistula to be of adequate depth but not adequate diameter for access.Outflow volume is minimal. -Return in 4 weeks for recheck and repeat Duplex study to assess maturity. Continue to exercise left hand. -I advised him to call our office sooner if his hand numbness worsens or he develops hand weakness or hand pain  Barbie Banner, PA-C 05/14/2020 1:18 PM [facility] [phone]  Clinic MD:  Carlis Abbott

## 2020-05-16 ENCOUNTER — Other Ambulatory Visit: Payer: Self-pay

## 2020-05-16 DIAGNOSIS — N185 Chronic kidney disease, stage 5: Secondary | ICD-10-CM

## 2020-05-27 ENCOUNTER — Encounter (HOSPITAL_COMMUNITY): Payer: Medicare Other

## 2020-06-10 ENCOUNTER — Other Ambulatory Visit: Payer: Self-pay

## 2020-06-10 ENCOUNTER — Encounter (HOSPITAL_COMMUNITY)
Admission: RE | Admit: 2020-06-10 | Discharge: 2020-06-10 | Disposition: A | Discharge status: Home / Self Care | Payer: Medicare Other | Source: Ambulatory Visit | Attending: Nephrology | Admitting: Nephrology

## 2020-06-10 VITALS — BP 175/75 | HR 67 | Temp 99.4°F | Resp 18

## 2020-06-10 DIAGNOSIS — N183 Chronic kidney disease, stage 3 unspecified: Secondary | ICD-10-CM | POA: Diagnosis present

## 2020-06-10 DIAGNOSIS — D631 Anemia in chronic kidney disease: Secondary | ICD-10-CM | POA: Diagnosis not present

## 2020-06-10 LAB — RENAL FUNCTION PANEL
Albumin: 2.8 g/dL — ABNORMAL LOW (ref 3.5–5.0)
Anion gap: 21 — ABNORMAL HIGH (ref 5–15)
BUN: 82 mg/dL — ABNORMAL HIGH (ref 8–23)
CO2: 11 mmol/L — ABNORMAL LOW (ref 22–32)
Calcium: 8.8 mg/dL — ABNORMAL LOW (ref 8.9–10.3)
Chloride: 111 mmol/L (ref 98–111)
Creatinine, Ser: 14.85 mg/dL — ABNORMAL HIGH (ref 0.61–1.24)
GFR, Estimated: 3 mL/min — ABNORMAL LOW (ref >60)
Glucose, Bld: 108 mg/dL — ABNORMAL HIGH (ref 70–99)
Phosphorus: >30 mg/dL — ABNORMAL HIGH (ref 2.5–4.6)
Potassium: 4.3 mmol/L (ref 3.5–5.1)
Sodium: 143 mmol/L (ref 135–145)

## 2020-06-10 LAB — FERRITIN: Ferritin: 146 ng/mL (ref 24–336)

## 2020-06-10 LAB — MAGNESIUM: Magnesium: 3 mg/dL — ABNORMAL HIGH (ref 1.7–2.4)

## 2020-06-10 LAB — IRON AND TIBC
Iron: 94 ug/dL (ref 45–182)
Saturation Ratios: 38 % (ref 17.9–39.5)
TIBC: 249 ug/dL — ABNORMAL LOW (ref 250–450)
UIBC: 155 ug/dL

## 2020-06-10 LAB — POCT HEMOGLOBIN-HEMACUE: Hemoglobin: 9.4 g/dL — ABNORMAL LOW (ref 13.0–17.0)

## 2020-06-10 MED ORDER — EPOETIN ALFA-EPBX 10000 UNIT/ML IJ SOLN
INTRAMUSCULAR | Status: AC
  Administered 2020-06-10: 10000 [IU]
  Filled 2020-06-10: qty 1

## 2020-06-10 MED ORDER — EPOETIN ALFA-EPBX 10000 UNIT/ML IJ SOLN: 10000.0000 [IU] | INTRAMUSCULAR | Status: DC

## 2020-06-11 LAB — PTH, INTACT AND CALCIUM
Calcium, Total (PTH): 8.5 mg/dL — ABNORMAL LOW (ref 8.6–10.2)
PTH: 201 pg/mL — ABNORMAL HIGH (ref 15–65)

## 2020-06-16 ENCOUNTER — Encounter (HOSPITAL_COMMUNITY): Payer: Self-pay | Admitting: Emergency Medicine

## 2020-06-16 ENCOUNTER — Emergency Department (HOSPITAL_COMMUNITY): Payer: Medicare Other

## 2020-06-16 ENCOUNTER — Inpatient Hospital Stay (HOSPITAL_COMMUNITY)
Admission: EM | Admit: 2020-06-16 | Discharge: 2020-07-02 | DRG: 853 | Disposition: A | Discharge status: Home Health | Payer: Medicare Other | Attending: Family Medicine | Admitting: Family Medicine

## 2020-06-16 ENCOUNTER — Other Ambulatory Visit: Payer: Self-pay

## 2020-06-16 DIAGNOSIS — Z452 Encounter for adjustment and management of vascular access device: Secondary | ICD-10-CM

## 2020-06-16 DIAGNOSIS — Z8249 Family history of ischemic heart disease and other diseases of the circulatory system: Secondary | ICD-10-CM

## 2020-06-16 DIAGNOSIS — J069 Acute upper respiratory infection, unspecified: Secondary | ICD-10-CM

## 2020-06-16 DIAGNOSIS — D649 Anemia, unspecified: Secondary | ICD-10-CM

## 2020-06-16 DIAGNOSIS — F10239 Alcohol dependence with withdrawal, unspecified: Secondary | ICD-10-CM | POA: Diagnosis not present

## 2020-06-16 DIAGNOSIS — K626 Ulcer of anus and rectum: Secondary | ICD-10-CM | POA: Diagnosis present

## 2020-06-16 DIAGNOSIS — N2581 Secondary hyperparathyroidism of renal origin: Secondary | ICD-10-CM | POA: Diagnosis present

## 2020-06-16 DIAGNOSIS — Q211 Atrial septal defect: Secondary | ICD-10-CM

## 2020-06-16 DIAGNOSIS — Z23 Encounter for immunization: Secondary | ICD-10-CM | POA: Diagnosis not present

## 2020-06-16 DIAGNOSIS — N186 End stage renal disease: Secondary | ICD-10-CM

## 2020-06-16 DIAGNOSIS — E875 Hyperkalemia: Secondary | ICD-10-CM | POA: Diagnosis not present

## 2020-06-16 DIAGNOSIS — K219 Gastro-esophageal reflux disease without esophagitis: Secondary | ICD-10-CM | POA: Diagnosis present

## 2020-06-16 DIAGNOSIS — K644 Residual hemorrhoidal skin tags: Secondary | ICD-10-CM | POA: Diagnosis present

## 2020-06-16 DIAGNOSIS — R0600 Dyspnea, unspecified: Secondary | ICD-10-CM

## 2020-06-16 DIAGNOSIS — G9349 Other encephalopathy: Secondary | ICD-10-CM | POA: Diagnosis not present

## 2020-06-16 DIAGNOSIS — N179 Acute kidney failure, unspecified: Secondary | ICD-10-CM | POA: Diagnosis not present

## 2020-06-16 DIAGNOSIS — R54 Age-related physical debility: Secondary | ICD-10-CM | POA: Diagnosis present

## 2020-06-16 DIAGNOSIS — D539 Nutritional anemia, unspecified: Secondary | ICD-10-CM

## 2020-06-16 DIAGNOSIS — D631 Anemia in chronic kidney disease: Secondary | ICD-10-CM | POA: Diagnosis present

## 2020-06-16 DIAGNOSIS — R0902 Hypoxemia: Secondary | ICD-10-CM

## 2020-06-16 DIAGNOSIS — Z79899 Other long term (current) drug therapy: Secondary | ICD-10-CM

## 2020-06-16 DIAGNOSIS — A413 Sepsis due to Hemophilus influenzae: Principal | ICD-10-CM | POA: Diagnosis present

## 2020-06-16 DIAGNOSIS — T380X5A Adverse effect of glucocorticoids and synthetic analogues, initial encounter: Secondary | ICD-10-CM | POA: Diagnosis not present

## 2020-06-16 DIAGNOSIS — N185 Chronic kidney disease, stage 5: Secondary | ICD-10-CM

## 2020-06-16 DIAGNOSIS — M7989 Other specified soft tissue disorders: Secondary | ICD-10-CM | POA: Diagnosis present

## 2020-06-16 DIAGNOSIS — K5900 Constipation, unspecified: Secondary | ICD-10-CM | POA: Diagnosis present

## 2020-06-16 DIAGNOSIS — K922 Gastrointestinal hemorrhage, unspecified: Secondary | ICD-10-CM

## 2020-06-16 DIAGNOSIS — D696 Thrombocytopenia, unspecified: Secondary | ICD-10-CM | POA: Diagnosis not present

## 2020-06-16 DIAGNOSIS — J449 Chronic obstructive pulmonary disease, unspecified: Secondary | ICD-10-CM | POA: Diagnosis not present

## 2020-06-16 DIAGNOSIS — I739 Peripheral vascular disease, unspecified: Secondary | ICD-10-CM | POA: Diagnosis not present

## 2020-06-16 DIAGNOSIS — K5730 Diverticulosis of large intestine without perforation or abscess without bleeding: Secondary | ICD-10-CM | POA: Diagnosis present

## 2020-06-16 DIAGNOSIS — I708 Atherosclerosis of other arteries: Secondary | ICD-10-CM | POA: Diagnosis present

## 2020-06-16 DIAGNOSIS — J1282 Pneumonia due to coronavirus disease 2019: Secondary | ICD-10-CM | POA: Diagnosis present

## 2020-06-16 DIAGNOSIS — U071 COVID-19: Secondary | ICD-10-CM

## 2020-06-16 DIAGNOSIS — J440 Chronic obstructive pulmonary disease with acute lower respiratory infection: Secondary | ICD-10-CM | POA: Diagnosis present

## 2020-06-16 DIAGNOSIS — E872 Acidosis: Secondary | ICD-10-CM | POA: Diagnosis present

## 2020-06-16 DIAGNOSIS — I5032 Chronic diastolic (congestive) heart failure: Secondary | ICD-10-CM | POA: Diagnosis present

## 2020-06-16 DIAGNOSIS — Z87891 Personal history of nicotine dependence: Secondary | ICD-10-CM

## 2020-06-16 DIAGNOSIS — K648 Other hemorrhoids: Secondary | ICD-10-CM | POA: Diagnosis present

## 2020-06-16 DIAGNOSIS — Z8673 Personal history of transient ischemic attack (TIA), and cerebral infarction without residual deficits: Secondary | ICD-10-CM

## 2020-06-16 DIAGNOSIS — Z419 Encounter for procedure for purposes other than remedying health state, unspecified: Secondary | ICD-10-CM

## 2020-06-16 DIAGNOSIS — L8990 Pressure ulcer of unspecified site, unspecified stage: Secondary | ICD-10-CM | POA: Insufficient documentation

## 2020-06-16 DIAGNOSIS — E785 Hyperlipidemia, unspecified: Secondary | ICD-10-CM | POA: Diagnosis present

## 2020-06-16 DIAGNOSIS — I70201 Unspecified atherosclerosis of native arteries of extremities, right leg: Secondary | ICD-10-CM | POA: Diagnosis present

## 2020-06-16 DIAGNOSIS — Z7982 Long term (current) use of aspirin: Secondary | ICD-10-CM

## 2020-06-16 DIAGNOSIS — I132 Hypertensive heart and chronic kidney disease with heart failure and with stage 5 chronic kidney disease, or end stage renal disease: Secondary | ICD-10-CM | POA: Diagnosis present

## 2020-06-16 DIAGNOSIS — M79671 Pain in right foot: Secondary | ICD-10-CM | POA: Diagnosis present

## 2020-06-16 DIAGNOSIS — J9601 Acute respiratory failure with hypoxia: Secondary | ICD-10-CM | POA: Diagnosis present

## 2020-06-16 DIAGNOSIS — K921 Melena: Secondary | ICD-10-CM | POA: Diagnosis present

## 2020-06-16 HISTORY — DX: Chronic obstructive pulmonary disease, unspecified: J44.9

## 2020-06-16 HISTORY — DX: COVID-19: U07.1

## 2020-06-16 HISTORY — DX: Acute upper respiratory infection, unspecified: J06.9

## 2020-06-16 LAB — CBC
HCT: 27.5 % — ABNORMAL LOW (ref 39.0–52.0)
HCT: 25.2 % — ABNORMAL LOW (ref 39.0–52.0)
Hemoglobin: 8.5 g/dL — ABNORMAL LOW (ref 13.0–17.0)
Hemoglobin: 8 g/dL — ABNORMAL LOW (ref 13.0–17.0)
MCH: 30.4 pg (ref 26.0–34.0)
MCH: 31.5 pg (ref 26.0–34.0)
MCHC: 30.9 g/dL (ref 30.0–36.0)
MCHC: 31.7 g/dL (ref 30.0–36.0)
MCV: 98.2 fL (ref 80.0–100.0)
MCV: 99.2 fL (ref 80.0–100.0)
Platelets: 160 10*3/uL (ref 150–400)
Platelets: 146 10*3/uL — ABNORMAL LOW (ref 150–400)
RBC: 2.8 MIL/uL — ABNORMAL LOW (ref 4.22–5.81)
RBC: 2.54 MIL/uL — ABNORMAL LOW (ref 4.22–5.81)
RDW: 16.4 % — ABNORMAL HIGH (ref 11.5–15.5)
RDW: 16.4 % — ABNORMAL HIGH (ref 11.5–15.5)
WBC: 10.5 10*3/uL (ref 4.0–10.5)
WBC: 12 10*3/uL — ABNORMAL HIGH (ref 4.0–10.5)
nRBC: 0.2 % (ref 0.0–0.2)
nRBC: 0 % (ref 0.0–0.2)

## 2020-06-16 LAB — TROPONIN I (HIGH SENSITIVITY)
Troponin I (High Sensitivity): 183 ng/L (ref <18)
Troponin I (High Sensitivity): 182 ng/L (ref <18)
Troponin I (High Sensitivity): 181 ng/L (ref <18)
Troponin I (High Sensitivity): 195 ng/L (ref <18)

## 2020-06-16 LAB — TRIGLYCERIDES: Triglycerides: 91 mg/dL (ref <150)

## 2020-06-16 LAB — LACTIC ACID, PLASMA
Lactic Acid, Venous: 1.2 mmol/L (ref 0.5–1.9)
Lactic Acid, Venous: 1.1 mmol/L (ref 0.5–1.9)

## 2020-06-16 LAB — URINALYSIS, ROUTINE W REFLEX MICROSCOPIC
Bilirubin Urine: NEGATIVE
Glucose, UA: 150 mg/dL — AB
Ketones, ur: NEGATIVE mg/dL
Leukocytes,Ua: NEGATIVE
Nitrite: NEGATIVE
Protein, ur: >=300 mg/dL — AB
Specific Gravity, Urine: 1.02 (ref 1.005–1.030)
pH: 5 (ref 5.0–8.0)

## 2020-06-16 LAB — D-DIMER, QUANTITATIVE: D-Dimer, Quant: 3.57 ug/mL-FEU — ABNORMAL HIGH (ref 0.00–0.50)

## 2020-06-16 LAB — COMPREHENSIVE METABOLIC PANEL
ALT: 11 U/L (ref 0–44)
AST: 15 U/L (ref 15–41)
Albumin: 2.2 g/dL — ABNORMAL LOW (ref 3.5–5.0)
Alkaline Phosphatase: 37 U/L — ABNORMAL LOW (ref 38–126)
Anion gap: 19 — ABNORMAL HIGH (ref 5–15)
BUN: 86 mg/dL — ABNORMAL HIGH (ref 8–23)
CO2: 10 mmol/L — ABNORMAL LOW (ref 22–32)
Calcium: 7.7 mg/dL — ABNORMAL LOW (ref 8.9–10.3)
Chloride: 108 mmol/L (ref 98–111)
Creatinine, Ser: 15.93 mg/dL — ABNORMAL HIGH (ref 0.61–1.24)
GFR, Estimated: 3 mL/min — ABNORMAL LOW (ref >60)
Glucose, Bld: 102 mg/dL — ABNORMAL HIGH (ref 70–99)
Potassium: 4.6 mmol/L (ref 3.5–5.1)
Sodium: 137 mmol/L (ref 135–145)
Total Bilirubin: 0.2 mg/dL — ABNORMAL LOW (ref 0.3–1.2)
Total Protein: 5 g/dL — ABNORMAL LOW (ref 6.5–8.1)

## 2020-06-16 LAB — CREATININE, SERUM
Creatinine, Ser: 15.82 mg/dL — ABNORMAL HIGH (ref 0.61–1.24)
GFR, Estimated: 3 mL/min — ABNORMAL LOW (ref >60)

## 2020-06-16 LAB — PREPARE RBC (CROSSMATCH)

## 2020-06-16 LAB — FIBRINOGEN: Fibrinogen: 571 mg/dL — ABNORMAL HIGH (ref 210–475)

## 2020-06-16 LAB — ABO/RH: ABO/RH(D): O POS

## 2020-06-16 LAB — PROTIME-INR
INR: 1.2 (ref 0.8–1.2)
Prothrombin Time: 14.7 seconds (ref 11.4–15.2)

## 2020-06-16 LAB — C-REACTIVE PROTEIN: CRP: 14.3 mg/dL — ABNORMAL HIGH (ref <1.0)

## 2020-06-16 LAB — POC OCCULT BLOOD, ED: Fecal Occult Bld: NEGATIVE

## 2020-06-16 LAB — POC SARS CORONAVIRUS 2 AG -  ED: SARS Coronavirus 2 Ag: POSITIVE — AB

## 2020-06-16 LAB — FERRITIN: Ferritin: 218 ng/mL (ref 24–336)

## 2020-06-16 LAB — PROCALCITONIN: Procalcitonin: 44.75 ng/mL

## 2020-06-16 LAB — APTT: aPTT: 39 seconds — ABNORMAL HIGH (ref 24–36)

## 2020-06-16 LAB — LACTATE DEHYDROGENASE: LDH: 263 U/L — ABNORMAL HIGH (ref 98–192)

## 2020-06-16 MED ORDER — SODIUM CHLORIDE 0.9 % IV SOLN
100.0000 mg | Freq: Every day | INTRAVENOUS | Status: AC
  Administered 2020-06-17 – 2020-06-20 (×3): 100 mg via INTRAVENOUS
  Filled 2020-06-16: qty 2.5
  Filled 2020-06-16 (×3): qty 20

## 2020-06-16 MED ORDER — DARBEPOETIN ALFA 60 MCG/0.3ML IJ SOSY: 60.0000 ug | PREFILLED_SYRINGE | INTRAMUSCULAR | Status: DC

## 2020-06-16 MED ORDER — SODIUM CHLORIDE 0.9 % IV SOLN: INTRAVENOUS | Status: DC

## 2020-06-16 MED ORDER — ALBUTEROL SULFATE HFA 108 (90 BASE) MCG/ACT IN AERS
1.0000 | INHALATION_SPRAY | Freq: Four times a day (QID) | RESPIRATORY_TRACT | Status: DC | PRN
  Administered 2020-06-16: 2 via RESPIRATORY_TRACT
  Administered 2020-06-18: 1 via RESPIRATORY_TRACT
  Administered 2020-06-18: 2 via RESPIRATORY_TRACT
  Filled 2020-06-16 (×2): qty 6.7

## 2020-06-16 MED ORDER — SEVELAMER CARBONATE 800 MG PO TABS
800.0000 mg | ORAL_TABLET | Freq: Three times a day (TID) | ORAL | Status: DC
  Administered 2020-06-17 (×2): 800 mg via ORAL
  Filled 2020-06-16: qty 1

## 2020-06-16 MED ORDER — SODIUM CHLORIDE 0.9% IV SOLUTION: Freq: Once | INTRAVENOUS | Status: DC

## 2020-06-16 MED ORDER — VANCOMYCIN HCL 1250 MG/250ML IV SOLN
1250.0000 mg | Freq: Once | INTRAVENOUS | Status: DC
  Filled 2020-06-16: qty 250

## 2020-06-16 MED ORDER — AMLODIPINE BESYLATE 10 MG PO TABS
10.0000 mg | ORAL_TABLET | Freq: Every day | ORAL | Status: DC
  Administered 2020-06-16 – 2020-07-02 (×16): 10 mg via ORAL
  Filled 2020-06-16 (×3): qty 1
  Filled 2020-06-16: qty 2
  Filled 2020-06-16 (×12): qty 1
  Filled 2020-06-16: qty 2

## 2020-06-16 MED ORDER — ACETAMINOPHEN 325 MG PO TABS
650.0000 mg | ORAL_TABLET | Freq: Four times a day (QID) | ORAL | Status: DC | PRN
  Administered 2020-06-18 – 2020-06-27 (×4): 650 mg via ORAL
  Filled 2020-06-16 (×4): qty 2

## 2020-06-16 MED ORDER — FUROSEMIDE 10 MG/ML IJ SOLN
80.0000 mg | Freq: Once | INTRAMUSCULAR | Status: AC
  Administered 2020-06-17: 80 mg via INTRAVENOUS
  Filled 2020-06-16: qty 8

## 2020-06-16 MED ORDER — METOPROLOL TARTRATE 25 MG PO TABS
25.0000 mg | ORAL_TABLET | Freq: Two times a day (BID) | ORAL | Status: DC
  Administered 2020-06-16 – 2020-07-02 (×31): 25 mg via ORAL
  Filled 2020-06-16 (×31): qty 1

## 2020-06-16 MED ORDER — SODIUM BICARBONATE 650 MG PO TABS
1300.0000 mg | ORAL_TABLET | Freq: Once | ORAL | Status: AC
  Administered 2020-06-16: 1300 mg via ORAL
  Filled 2020-06-16 (×2): qty 2

## 2020-06-16 MED ORDER — HEPARIN SODIUM (PORCINE) 5000 UNIT/ML IJ SOLN
5000.0000 [IU] | Freq: Three times a day (TID) | INTRAMUSCULAR | Status: DC
  Administered 2020-06-16 – 2020-06-18 (×5): 5000 [IU] via SUBCUTANEOUS
  Filled 2020-06-16 (×6): qty 1

## 2020-06-16 MED ORDER — ONDANSETRON HCL 4 MG/2ML IJ SOLN
4.0000 mg | Freq: Four times a day (QID) | INTRAMUSCULAR | Status: DC | PRN
  Administered 2020-06-19: 4 mg via INTRAVENOUS
  Filled 2020-06-16: qty 2

## 2020-06-16 MED ORDER — SODIUM CHLORIDE 0.9 % IV SOLN
2.0000 g | Freq: Once | INTRAVENOUS | Status: DC
  Filled 2020-06-16: qty 2

## 2020-06-16 MED ORDER — POLYETHYLENE GLYCOL 3350 17 G PO PACK
17.0000 g | PACK | Freq: Every day | ORAL | Status: DC
  Administered 2020-06-16 – 2020-06-26 (×8): 17 g via ORAL
  Filled 2020-06-16 (×9): qty 1

## 2020-06-16 MED ORDER — VANCOMYCIN HCL IN DEXTROSE 1-5 GM/200ML-% IV SOLN: 1000.0000 mg | Freq: Once | INTRAVENOUS | Status: DC

## 2020-06-16 MED ORDER — DEXAMETHASONE 6 MG PO TABS
6.0000 mg | ORAL_TABLET | ORAL | Status: DC
  Administered 2020-06-16 – 2020-06-22 (×7): 6 mg via ORAL
  Filled 2020-06-16: qty 2
  Filled 2020-06-16: qty 1
  Filled 2020-06-16: qty 2
  Filled 2020-06-16 (×3): qty 1

## 2020-06-16 MED ORDER — CHLORHEXIDINE GLUCONATE CLOTH 2 % EX PADS
6.0000 | MEDICATED_PAD | Freq: Every day | CUTANEOUS | Status: DC
  Administered 2020-06-19 – 2020-06-30 (×9): 6 via TOPICAL

## 2020-06-16 MED ORDER — SODIUM CHLORIDE 0.9 % IV SOLN
200.0000 mg | Freq: Once | INTRAVENOUS | Status: AC
  Administered 2020-06-16: 200 mg via INTRAVENOUS
  Filled 2020-06-16: qty 40

## 2020-06-16 MED ORDER — ONDANSETRON HCL 4 MG PO TABS: 4.0000 mg | ORAL_TABLET | Freq: Four times a day (QID) | ORAL | Status: DC | PRN

## 2020-06-16 MED ORDER — FUROSEMIDE 10 MG/ML IJ SOLN
80.0000 mg | Freq: Once | INTRAMUSCULAR | Status: AC
  Administered 2020-06-16: 80 mg via INTRAVENOUS
  Filled 2020-06-16: qty 8

## 2020-06-16 NOTE — ED Triage Notes (Addendum)
Pt reports bright red rectal bleeding, epigastric pain, and SOB x 4 days.  O2 sats 86% on arrival

## 2020-06-16 NOTE — ED Provider Notes (Signed)
[facility] EMERGENCY DEPARTMENT Provider Note   CSN: [id number] Arrival date & time: 06/16/20  1134     History Chief Complaint  Patient presents with  . Rectal Bleeding    Albert Harris is a 65 y.o. male who presents emergency department with a chief complaint of shortness of breath.  Patient had 3 days of worsening cough, wheezing and shortness of breath.  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  The patient is currently in respiratory distress which somewhat limits the history.  He reports that he is also had hard stools and bright red blood per rectum over the past 2 days.  He denies any active abdominal pain urinary symptoms.  He is not vaccinated against Covid.  He is currently requiring 6 L of oxygen and is waffling between 86 and 92% on nasal cannula.  He does not generally require oxygen on a regular basis.  He states he has had problems with breathing but never this bad.  HPI     Past Medical History:  Diagnosis Date  . Asthma   . Carotid stenosis 09/12/2019  . Claudication in peripheral vascular disease ([place]) 09/12/2019  . Complication of anesthesia    " i HAVE A HARD TIME WAKING UP "  . COPD (chronic obstructive pulmonary disease) ([place])   . GERD (gastroesophageal reflux disease)   . HLD (hyperlipidemia)   . Hypertension   . PFO with atrial septal aneurysm    by echo 11/2017  . Stroke ([place])   . [name] (transient ischemic attack) 11/2017  . Tobacco abuse    quit 07/17/19    Patient Active Problem List   Diagnosis Date Noted  . COVID-19 06/16/2020  . CKD (chronic kidney disease) stage 5, GFR less than 15 ml/min (HCC) 03/19/2020  . Claudication in peripheral vascular disease ([place]) 09/12/2019  . Carotid stenosis 09/12/2019  . PFO (patent foramen ovale) 08/10/2019  . Noncompliance 08/10/2019  . Hx of transient ischemic  attack ([name]) 08/10/2019  . Chronic diastolic heart failure ([place])   . Elevated troponin   . Hypertensive emergency   . Acute respiratory failure with hypoxia ([place]) 07/18/2019  . Acute pulmonary edema ([place]) 07/18/2019  . ARF (acute renal failure) ([place]) 07/18/2019  . Macrocytic anemia 07/18/2019  . Right sided weakness 11/09/2017  . CKD (chronic kidney disease), stage III ([place]) 11/09/2017  . Tobacco abuse 01/19/2017  . Hypertensive urgency 12/24/2016  . [name] (transient ischemic attack) 12/24/2016    Past Surgical History:  Procedure Laterality Date  . AV FISTULA PLACEMENT Left 03/28/2020   Procedure: ARTERIOVENOUS (AV) FISTULA CREATION LEFT;  Surgeon: [name], MD;  Location: [facility];  Service: Vascular;  Laterality: Left;  . COLONOSCOPY    . FINGER SURGERY         Family History  Problem Relation Age of Onset  . Hypertension Mother   . Colon cancer Father   . Stomach cancer Brother     Social History   Tobacco Use  . Smoking status: Former Smoker    Packs/day: 1.00    Years: 50.00    Pack years: 50.00    Types: Cigarettes    Quit date: 07/17/2019    Years since quitting: 0.9  . Smokeless tobacco: Never Used  Vaping Use  . Vaping Use: Never used  Substance Use Topics  . Alcohol use: Yes  Alcohol/week: 1.0 - 3.0 standard drink    Types: 1 - 3 Cans of beer per week  . Drug use: No    Home Medications Prior to Admission medications   Medication Sig Start Date End Date Taking? Authorizing Provider  albuterol (VENTOLIN HFA) 108 (90 Base) MCG/ACT inhaler Inhale 1-2 puffs into the lungs every 6 (six) hours as needed for wheezing or shortness of breath.    [provider]  amLODipine (NORVASC) 10 MG tablet Take 1 tablet (10 mg total) by mouth daily. 08/30/19   Kerin Perna, NP  aspirin EC 81 MG tablet Take 81 mg by mouth daily.    [provider]  atorvastatin (LIPITOR) 40 MG tablet Take 1 tablet (40 mg total) by mouth daily. Patient  not taking: Reported on 05/14/2020 12/18/19 03/17/20  Lendon Colonel, NP  doxazosin (CARDURA) 8 MG tablet Take 1 tablet (8 mg total) by mouth daily. Patient taking differently: Take 4 mg by mouth daily.  10/12/19   Skeet Latch, MD  furosemide (LASIX) 20 MG tablet Take 20 mg by mouth daily as needed for fluid.     [provider]  hydrALAZINE (APRESOLINE) 100 MG tablet Take 1 tablet (100 mg total) by mouth 3 (three) times daily. Patient taking differently: Take 100 mg by mouth daily.  09/12/19   Skeet Latch, MD  HYDROcodone-acetaminophen (NORCO) 5-325 MG tablet Take 1 tablet by mouth every 6 (six) hours as needed for moderate pain. 03/28/20   Dagoberto Ligas, PA-C  isosorbide mononitrate (IMDUR) 30 MG 24 hr tablet Take 1 tablet (30 mg total) by mouth daily. 11/15/19 02/13/20  Croitoru, Mihai, MD  metoprolol tartrate (LOPRESSOR) 25 MG tablet Take 25 mg by mouth 2 (two) times daily. 04/23/20   [provider]  nitroGLYCERIN (NITROSTAT) 0.4 MG SL tablet Place 1 tablet (0.4 mg total) under the tongue every 5 (five) minutes x 3 doses as needed for chest pain. Patient taking differently: Place 0.4 mg under the tongue every 5 (five) minutes as needed for chest pain.  07/23/19   Aline August, MD  sodium bicarbonate 650 MG tablet Take 650 mg by mouth daily as needed for heartburn. 02/29/20   [provider]  Vitamin D, Ergocalciferol, (DRISDOL) 1.25 MG (50000 UNIT) CAPS capsule Take 50,000 Units by mouth every 7 (seven) days.     [provider]    Allergies    Patient has no known allergies.  Review of Systems   Review of Systems Ten systems reviewed and are negative for acute change, except as noted in the HPI.   Physical Exam Updated Vital Signs BP (!) 161/95   Pulse 76   Temp (!) 100.6 F (38.1 C) (Rectal)   Resp (!) 37   Ht 5\' 8"  (1.727 m)   Wt 61.2 kg   SpO2 100%   BMI 20.53 kg/m   Physical Exam Vitals and nursing note reviewed.   Constitutional:      General: He is not in acute distress.    Appearance: He is well-developed and well-nourished. He is not diaphoretic.     Comments: Pale   HENT:     Head: Normocephalic and atraumatic.     Mouth/Throat:     Mouth: Mucous membranes are dry.  Eyes:     General: No scleral icterus.    Extraocular Movements: Extraocular movements intact.     Conjunctiva/sclera: Conjunctivae normal.  Cardiovascular:     Rate and Rhythm: Normal rate and regular rhythm.  Heart sounds: Normal heart sounds.  Pulmonary:     Effort: Tachypnea, prolonged expiration and respiratory distress present.     Breath sounds: Wheezing and rhonchi present.  Abdominal:     Palpations: Abdomen is soft.     Tenderness: There is no abdominal tenderness.  Genitourinary:    Comments: Digital Rectal Exam reveals sphincter with good tone. No external hemorrhoids. No masses or fissures. Stool color is brown with no overt blood.  Musculoskeletal:        General: No edema.  Skin:    General: Skin is warm and dry.  Neurological:     Mental Status: He is alert.  Psychiatric:        Behavior: Behavior normal.     ED Results / Procedures / Treatments   Labs (all labs ordered are listed, but only abnormal results are displayed) Labs Reviewed  COMPREHENSIVE METABOLIC PANEL - Abnormal; Notable for the following components:      Result Value   CO2 10 (*)    Glucose, Bld 102 (*)    BUN 86 (*)    Creatinine, Ser 15.93 (*)    Calcium 7.7 (*)    Total Protein 5.0 (*)    Albumin 2.2 (*)    Alkaline Phosphatase 37 (*)    Total Bilirubin 0.2 (*)    GFR, Estimated 3 (*)    Anion gap 19 (*)    All other components within normal limits  CBC - Abnormal; Notable for the following components:   RBC 2.80 (*)    Hemoglobin 8.5 (*)    HCT 27.5 (*)    RDW 16.4 (*)    All other components within normal limits  APTT - Abnormal; Notable for the following components:   aPTT 39 (*)    All other components  within normal limits  D-DIMER, QUANTITATIVE (NOT AT [facility]) - Abnormal; Notable for the following components:   D-Dimer, Quant 3.57 (*)    All other components within normal limits  FIBRINOGEN - Abnormal; Notable for the following components:   Fibrinogen 571 (*)    All other components within normal limits  LACTATE DEHYDROGENASE - Abnormal; Notable for the following components:   LDH 263 (*)    All other components within normal limits  POC SARS CORONAVIRUS 2 AG -  ED - Abnormal; Notable for the following components:   SARS Coronavirus 2 Ag POSITIVE (*)    All other components within normal limits  TROPONIN I (HIGH SENSITIVITY) - Abnormal; Notable for the following components:   Troponin I (High Sensitivity) 183 (*)    All other components within normal limits  TROPONIN I (HIGH SENSITIVITY) - Abnormal; Notable for the following components:   Troponin I (High Sensitivity) 182 (*)    All other components within normal limits  CULTURE, BLOOD (ROUTINE X 2)  CULTURE, BLOOD (ROUTINE X 2)  URINE CULTURE  LACTIC ACID, PLASMA  PROTIME-INR  PROCALCITONIN  TRIGLYCERIDES  LACTIC ACID, PLASMA  URINALYSIS, ROUTINE W REFLEX MICROSCOPIC  FERRITIN  C-REACTIVE PROTEIN  CREATININE, SERUM  CBC  POC OCCULT BLOOD, ED  TYPE AND SCREEN  ABO/RH  TROPONIN I (HIGH SENSITIVITY)    EKG EKG Interpretation  Date/Time:  Sunday June 16 2020 12:05:40 EST Ventricular Rate:  71 PR Interval:  152 QRS Duration: 74 QT Interval:  418 QTC Calculation: 454 R Axis:   -2 Text Interpretation: Normal sinus rhythm Possible Left atrial enlargement Left ventricular hypertrophy with repolarization abnormality ( R in aVL , Sokolow-Lyon )  Abnormal ECG No acute changes No significant change since last tracing Confirmed by Varney Biles [phone] on 06/16/2020 1:59:39 PM   Radiology DG Chest Portable 1 View  Result Date: 06/16/2020 CLINICAL DATA:  Shortness of breath for 4 days. EXAM: PORTABLE CHEST 1 VIEW  COMPARISON:  Chest x-ray dated 07/17/2019. FINDINGS: New ill-defined airspace opacities within the RIGHT mid and lower lung zones. Additional subtle opacity at the LEFT lung base. No pleural effusion or pneumothorax is seen. Stable mild cardiomegaly. IMPRESSION: 1. New multifocal airspace opacities bilaterally, RIGHT greater than LEFT, suspicious for multifocal pneumonia, alternatively asymmetric pulmonary edema pattern. 2. Stable mild cardiomegaly. Electronically Signed   By: Franki Cabot M.D.   On: 06/16/2020 12:54    Procedures .Critical Care Performed by: Margarita Mail, PA-C Authorized by: Margarita Mail, PA-C   Critical care provider statement:    Critical care time (minutes):  45   Critical care time was exclusive of:  Separately billable procedures and treating other patients   Critical care was necessary to treat or prevent imminent or life-threatening deterioration of the following conditions:  Respiratory failure   Critical care was time spent personally by me on the following activities:  Discussions with consultants, evaluation of patient's response to treatment, examination of patient, ordering and performing treatments and interventions, ordering and review of laboratory studies, ordering and review of radiographic studies, pulse oximetry, re-evaluation of patient's condition, obtaining history from patient or surrogate and review of old charts   (including critical care time)  Medications Ordered in ED Medications  remdesivir 200 mg in sodium chloride 0.9% 250 mL IVPB (has no administration in time range)    Followed by  remdesivir 100 mg in sodium chloride 0.9 % 100 mL IVPB (has no administration in time range)  heparin injection 5,000 Units (has no administration in time range)  dexamethasone (DECADRON) tablet 6 mg (has no administration in time range)  acetaminophen (TYLENOL) tablet 650 mg (has no administration in time range)  polyethylene glycol (MIRALAX / GLYCOLAX)  packet 17 g (has no administration in time range)  ondansetron (ZOFRAN) tablet 4 mg (has no administration in time range)    Or  ondansetron (ZOFRAN) injection 4 mg (has no administration in time range)  amLODipine (NORVASC) tablet 10 mg (has no administration in time range)  metoprolol tartrate (LOPRESSOR) tablet 25 mg (has no administration in time range)  albuterol (VENTOLIN HFA) 108 (90 Base) MCG/ACT inhaler 1-2 puff (has no administration in time range)    ED Course  I have reviewed the triage vital signs and the nursing notes.  Pertinent labs & imaging results that were available during my care of the patient were reviewed by me and considered in my medical decision making (see chart for details).  Clinical Course as of 06/16/20 1622  Nancy Fetter Jun 16, 2020  1350 Rectal temp Documented as 100.6, however this was taken by my student [place] who reports that it was 100.9 F. [AH]  1352 Chest x-ray concerning for multifocal pneumonia versus edema.  With patient's fever I have concern for pneumonia.  He has been started on sepsis protocol.  Covid test is pending.  He is unvaccinated. [AH]  1620 Comprehensive metabolic panel(!) [AH]    Clinical Course User Index [AH] Margarita Mail, PA-C   MDM Rules/Calculators/A&P                          Final Clinical Impression(s) / ED Diagnoses Final diagnoses:  None  CC: Shortness of breath VS: BP (!) 161/95   Pulse 76   Temp (!) 100.6 F (38.1 C) (Rectal)   Resp (!) 37   Ht 5\' 8"  (1.727 m)   Wt 61.2 kg   SpO2 100%   BMI 20.53 kg/m   TJ:[id number] is gathered by patient  and EMR. Previous records obtained and reviewed. DDX:[name] complaint of sob involves an extensive number of diagnostic and treatment options, and is a complaint that carries with it a high risk of complications, morbidity, and potential mortality. Given the large differential diagnosis, medical decision making is of high complexity. The emergent differential  diagnosis for shortness of breath includes, but is not limited to, Pulmonary edema, bronchoconstriction, Pneumonia, Pulmonary embolism, Pneumotherax/ Hemothorax, Dysrythmia, ACS.   Labs: I ordered reviewed and interpreted labs which include CBC which shows a hemoglobin of 8.5, CMP with creatinine of 15.93 and BUN of 86.  Patient's creatinine has been trending upward over the past several months.  His current GFR is at 3 with an anion gap of 19 likely secondary to BUN level. Patient's Covid test is positive.  He has elevated inflammatory markers including APTT, LDH, fibrinogen, D-dimer level. Triglyceride lactic acid and procalcitonin within normal limits Troponin at 182 in the setting of renal failure.  I do not think this represents ACS. Imaging: I ordered and reviewed images which included portable 1 view chest x-ray I independently visualized and interpreted all imaging. Significant findings include acute multifocal pneumonia versus edema.  EKG: Tachycardia at a rate of 71 Consults: Family medicine residency service MDM: Patient with acute hypoxic respiratory failure in the setting of Covid pneumonia.  Secondarily the patient appears to be in end-stage renal failure with a creatinine of almost 16, BUN of 86 and a GFR of 3.  Patient will need admission he is currently requiring at least 6 L of oxygen to maintain low oxygen saturations.  I have asked respiratory therapy to evaluate the patient for high flow heated oxygen via nasal cannula as he is still tachypneic into the mid 30s and low 40s. Patient disposition:The patient appears reasonably stabilized for admission considering the current resources, flow, and capabilities available in the ED at this time, and I doubt any other [facility] requiring further screening and/or treatment in the ED prior to admission.    Albert Harris was evaluated in Emergency Department on 06/16/2020 for the symptoms described in the history of present illness. He was  evaluated in the context of the global COVID-19 pandemic, which necessitated consideration that the patient might be at risk for infection with the SARS-CoV-2 virus that causes COVID-19. Institutional protocols and algorithms that pertain to the evaluation of patients at risk for COVID-19 are in a state of rapid change based on information released by regulatory bodies including the CDC and federal and state organizations. These policies and algorithms were followed during the patient's care in the ED.      Rx / DC Orders ED Discharge Orders    None       Margarita Mail, PA-C 06/16/20 [place], Ankit, MD 06/17/20 2122

## 2020-06-16 NOTE — Progress Notes (Signed)
PM check Patient has visible increased WOB. O2 sats on 4L 100% RR 38 initially. Patient states he feels better breathing wise but continues to feel tired. Lungs are clear to auscultation bilaterally, good aeration in all lung fields. With deep breaths RR improved to 21. Discussed taking deeper longer breaths to slow breathing. Will touch base with nurse to call if patient changes from current baseline. Plan to have a low threshold to contact CCM if patient shows signs of decline.   Gerlene Fee, DO 06/16/2020, 8:59 PM PGY-2, [place]

## 2020-06-16 NOTE — ED Notes (Signed)
Pt aware of need for urine sample, urinal at bedside 

## 2020-06-16 NOTE — ED Notes (Signed)
Spoke to MD via phone, plan currently to not transfuse PRBCs.

## 2020-06-16 NOTE — H&P (Cosign Needed Addendum)
[facility] Admission History and Physical Service Pager: [phone]  Patient name: Albert Harris Medical record number: [id number] Date of birth: 04/02/1956 Age: 65 y.o. Gender: male  Primary Care Provider: Kerin Perna, NP Consultants: nephro Code Status:  full Preferred Emergency Contact: Daughter  Chief Complaint: SOB  Assessment and Plan: Albert Harris is a 65 y.o. male presenting with difficulty breathing. PMH is significant for COPD, CKD Stage V, HFpEF, PAD, [name], HTN, HLD, and Carotid Stenosis.  Acute Hypoxic Respiratory Failure 2/2 COVID-19 pneumonia, meeting sepsis criteria- no oxygen requirement at baseline. Patient. has underlying COPD for which he uses Albuterol as needed. Tachypneic to 39 with good O2 sats on 3Lnc, febrile to 100.6 on admission. Exam shows increased WOB and speaking in full sentences, coarse breath sounds bilaterally, wheezes in right lower lung base. Patient is unvaccinated. Chest xray suspicious for multifocal pneumonia and asymmetric pulmonary edema. Symptoms fit with COVID-19 infection.  Lactic acid- 1.2.  Less likely bacterial pneumonia, though low threshold for starting antibiotics if worsening clinically.  Also concern for PE.  Has Wells score of 3 so less concern. Has unilateral swelling in left leg, though reports this is chronic.  D-Dimer elevated at 3.57 though elevation could be due to COVID-19 and poor kidney function. Delay CTA given poor kidney function.  Less likely MI given symptoms, ECG neg.  Trop elevated at 183 though this likely elevated due to poor kidney function. - Admit to FPTS, Progressive Attending Dr Ardelia Mems - Maintain O2 sats > 90%, wean O2 as tolerated - Continuous Pulse Ox - Vital signs q4hr - AM CMP, CBC with Diff, D-Dimer, CRP - Albuterol q4hr - Continue Remdesevir  - Start Decadron 6 mg daily  - f/u Procal, UCx, and BCx - f/u troponin trend - Strict I's/O's - incentive spirometer - prone  as tolerated  BRBPR- Patient reports multiple bright and dark red blood with BMs, and streaking on wiping over last 3-4 days.  FOBT currently negative.  Has not had episode while in hospital. Not on chronic blood thinners. Has  taken 81 mg Aspirin for past several days. Endorses intermittent and worsening constipation. Etiology includes diverticulosis, hemorrhoids, ulcers, anal fissures exacerbated in setting of constipation. Concern for malignancy increases in the setting of last colonoscopy was in 2005, patient reports he was told at that time he should have surgery but never followed up as well as his acutely worsening anemia. Hgb-8.5 on admission from Hgb of 9.4 on 1/03. Weight 61.2kg today from 65.3kg about a year ago.  No signs of active bleed.  - Will start daily miralax - AM CBC  - Plan to reach out to GI tomorrow, will reach out sooner if repeat episode of hematochazia - Monitor bowel movements  CKDV- has not started on HD.  Was seen by Nephrology in hospital in February with outpatient follow-up. had AV fistula graft done in October 2021.  Cr-15.93, BUN 86, GFR3 today.  Has had gradually worsening Cr over past year with Cr of 5.7 back in February and 14.85 earlier this month. Electrolytes stable- K+ 4.6, Na 137, Ca 7.7 (albumin 2.2).  Reports continuing to urinate well. Signs of fluid overload with crackles on lung exam and unilateral swelling in left leg. Patient mentating appropriately. No findings concernig for Uremic Encephelopathy.  Do not believe he has indicatioins for emergent dialysis.  Spoke with Nephrology who recommended giving dose IV Lasix 80 mg and they will consult.  - Nephrology following, appreciate recs  -  initiate dialysis 1/10 and attempt to use AVF, if not, plan for tunneled cath - Give IV Lasix 80 mg x1 dose - Avoid renal toxic medications - AM CMP - fluid restriction - strict I/O - f/u urinalysis  AoC Anemia- baseline appears to be around 10. Hgb-8.5 on admission  and decreased to 8.0 on repeat. Related to CKD and likely acute worsening per report of bleeding. Hgb of 9.4 on 1/03. FOBT- negative.  No signs of active bleed. - AM CBC  - Plan to reach out to GI tomorrow, will reach out sooner if repeat episode of hematochazia - Transfuse pRBC if Hgb<8  HTN  Poorly controlled in past.  SBP has been 150-170.  Takes Amlodipine 10 mg and Metoprolol 25 mg BID at home.  Was previously prescribed Imdur, Doxazosin, and Hydralazine but not currently taking.  Unsure if taking Lasix 20 mg at home. - Continue Amlodipine and Metoprolol - Hold Imdur, Doxazosin, and Hydralazine  HFpEF Echo from 2/21 showed EF of 60-65%.  Found to have Diastolic Dysfunction Grade II.  Currently on Metoprolol 25 mg BID at home.  Was previously on Lasix 20 mg.   - Continue Metoprolol 25 mg BID  Severe PAD  carotid stenosis- poor candidate for intervention given kidney function, per patient. Increased concern for elevated troponins as can be used as marker for CAD - PT/OT  H/o [name]- no residual symptoms - start statin  HLD Previously on Statin therapy.  Has not been taking - Obtain Lipid Panel - begin atorvastatin 40  FEN/GI: Renal Diet Prophylaxis: Heparin  Disposition: Progressive  History of Present Illness:  Albert Harris is a 65 y.o. male presenting with SOB x3 days. Mild sternal chest discomfort. Breathing has been the same for the entire course.   Also endorses cough, congestion and sore throat. Cough has been non-productive.  No N/V outside of when he took Tums and spit them up a few days ago.  Endorses swelling in left leg but has been a chronic issue not had any recent increase.  Used inhaler at home which helped some. Does not use O2 at home.  Lives at home with wife and has not had any recent sick contacts he has been aware of.  Has not had COVID vaccine. Has history of smoking and quit in February 2021.  Has continued to have good fluid intake and has been urinating  frequently.  Denies any diarrhea.  Does endorse rectal bleeding x4 days. Occurs with BMs consistently. Has h/o intermittent constipation which was present prior to first bloody bowel movement, but has resolved now. This has occurred before and was minor. Has not seen a doctor for it before. Painless bleeding. Has not taken any miralax. Stool softener x1. Streaking on paper. Bright red and dark red. Usually taking a shower after BMs resolves it. Last colonoscopy was in 2005. Patient thinks they told him he would need surgery.  Has taken Aspirin lately- 1/day for 3-4 days.     Review Of Systems: Per HPI with the following additions:  Review of Systems  Constitutional: Positive for fever.  HENT: Positive for congestion and sore throat. Negative for trouble swallowing.   Respiratory: Positive for cough and shortness of breath.   Gastrointestinal: Positive for blood in stool and constipation. Negative for abdominal pain, diarrhea, nausea, rectal pain and vomiting.  Genitourinary: Positive for frequency. Negative for difficulty urinating and dysuria.     Patient Active Problem List   Diagnosis Date Noted  . CKD (  chronic kidney disease) stage 5, GFR less than 15 ml/min (HCC) 03/19/2020  . Claudication in peripheral vascular disease ([place]) 09/12/2019  . Carotid stenosis 09/12/2019  . PFO (patent foramen ovale) 08/10/2019  . Noncompliance 08/10/2019  . Hx of transient ischemic attack ([name]) 08/10/2019  . Chronic diastolic heart failure ([place])   . Elevated troponin   . Hypertensive emergency   . Acute respiratory failure with hypoxia ([place]) 07/18/2019  . Acute pulmonary edema ([place]) 07/18/2019  . ARF (acute renal failure) ([place]) 07/18/2019  . Macrocytic anemia 07/18/2019  . Right sided weakness 11/09/2017  . CKD (chronic kidney disease), stage III ([place]) 11/09/2017  . Tobacco abuse 01/19/2017  . Hypertensive urgency 12/24/2016  . [name] (transient ischemic attack) 12/24/2016    Past Medical  History: Past Medical History:  Diagnosis Date  . Asthma   . Carotid stenosis 09/12/2019  . Claudication in peripheral vascular disease ([place]) 09/12/2019  . Complication of anesthesia    " i HAVE A HARD TIME WAKING UP "  . COPD (chronic obstructive pulmonary disease) ([place])   . GERD (gastroesophageal reflux disease)   . HLD (hyperlipidemia)   . Hypertension   . PFO with atrial septal aneurysm    by echo 11/2017  . Stroke ([place])   . [name] (transient ischemic attack) 11/2017  . Tobacco abuse    quit 07/17/19    Past Surgical History: Past Surgical History:  Procedure Laterality Date  . AV FISTULA PLACEMENT Left 03/28/2020   Procedure: ARTERIOVENOUS (AV) FISTULA CREATION LEFT;  Surgeon: [name], MD;  Location: [facility];  Service: Vascular;  Laterality: Left;  . COLONOSCOPY    . FINGER SURGERY      Social History: Social History   Tobacco Use  . Smoking status: Former Smoker    Packs/day: 1.00    Years: 50.00    Pack years: 50.00    Types: Cigarettes    Quit date: 07/17/2019    Years since quitting: 0.9  . Smokeless tobacco: Never Used  Vaping Use  . Vaping Use: Never used  Substance Use Topics  . Alcohol use: Yes    Alcohol/week: 1.0 - 3.0 standard drink    Types: 1 - 3 Cans of beer per week  . Drug use: No   Additional social history: [place] at home with wife Please also refer to relevant sections of EMR.  Family History: Family History  Problem Relation Age of Onset  . Hypertension Mother   . Colon cancer Father   . Stomach cancer Brother     Allergies and Medications: No Known Allergies No current facility-administered medications on file prior to encounter.   Current Outpatient Medications on File Prior to Encounter  Medication Sig Dispense Refill  . albuterol (VENTOLIN HFA) 108 (90 Base) MCG/ACT inhaler Inhale 1-2 puffs into the lungs every 6 (six) hours as needed for wheezing or shortness of breath.    Marland Kitchen amLODipine (NORVASC) 10 MG tablet Take 1 tablet  (10 mg total) by mouth daily. 30 tablet 3  . aspirin EC 81 MG tablet Take 81 mg by mouth daily.    Marland Kitchen atorvastatin (LIPITOR) 40 MG tablet Take 1 tablet (40 mg total) by mouth daily. (Patient not taking: Reported on 05/14/2020) 90 tablet 3  . doxazosin (CARDURA) 8 MG tablet Take 1 tablet (8 mg total) by mouth daily. (Patient taking differently: Take 4 mg by mouth daily. ) 90 tablet 1  . furosemide (LASIX) 20 MG tablet Take 20 mg by mouth daily  as needed for fluid.     . hydrALAZINE (APRESOLINE) 100 MG tablet Take 1 tablet (100 mg total) by mouth 3 (three) times daily. (Patient taking differently: Take 100 mg by mouth daily. ) 90 tablet 3  . HYDROcodone-acetaminophen (NORCO) 5-325 MG tablet Take 1 tablet by mouth every 6 (six) hours as needed for moderate pain. 10 tablet 0  . isosorbide mononitrate (IMDUR) 30 MG 24 hr tablet Take 1 tablet (30 mg total) by mouth daily. 90 tablet 3  . metoprolol tartrate (LOPRESSOR) 25 MG tablet Take 25 mg by mouth 2 (two) times daily.    . nitroGLYCERIN (NITROSTAT) 0.4 MG SL tablet Place 1 tablet (0.4 mg total) under the tongue every 5 (five) minutes x 3 doses as needed for chest pain. (Patient taking differently: Place 0.4 mg under the tongue every 5 (five) minutes as needed for chest pain. ) 30 tablet 0  . sodium bicarbonate 650 MG tablet Take 650 mg by mouth daily as needed for heartburn.    . Vitamin D, Ergocalciferol, (DRISDOL) 1.25 MG (50000 UNIT) CAPS capsule Take 50,000 Units by mouth every 7 (seven) days.       Objective: BP (!) 163/76   Pulse 77   Temp (!) 100.6 F (38.1 C) (Rectal)   Resp (!) 37   Ht 5\' 8"  (1.727 m)   Wt 61.2 kg   SpO2 100%   BMI 20.53 kg/m  Exam:  Physical Exam Constitutional:      General: He is not in acute distress. HENT:     Head: Normocephalic and atraumatic.     Mouth/Throat:     Mouth: Mucous membranes are moist.  Cardiovascular:     Rate and Rhythm: Normal rate and regular rhythm.     Comments: Radial pulses 2+,  Pedal pulses thready Pulmonary:     Breath sounds: Wheezing and rhonchi present.     Comments: Patient able to speak in full sentences but does have increased work of breathing.  Significant diffuse ronchi throughout lungs.  Some wheezing in right lower lung base. Abdominal:     General: Abdomen is flat. There is no distension.     Palpations: Abdomen is soft.     Tenderness: There is no abdominal tenderness.  Musculoskeletal:     Right lower leg: No edema.     Left lower leg: Edema present.     Comments: 2+ pitting edema up to knee  Skin:    General: Skin is warm.     Capillary Refill: Capillary refill takes less than 2 seconds.  Neurological:     General: No focal deficit present.     Mental Status: He is alert.  Psychiatric:        Mood and Affect: Mood normal.        Behavior: Behavior normal.        Thought Content: Thought content normal.     Labs and Imaging: CBC BMET  Recent Labs  Lab 06/16/20 1223  WBC 10.5  HGB 8.5*  HCT 27.5*  PLT 160   Recent Labs  Lab 06/16/20 1223  NA 137  K 4.6  CL 108  CO2 10*  BUN 86*  CREATININE 15.93*  GLUCOSE 102*  CALCIUM 7.7*     Trop-183 FOBT- Negative Lactic Acid-1.2 APTT-39 PT-14.7 D-Dimer- 3.57 Fibrinogen- 571 LDH- 263 Trig- 91   EKG: My own interpretation (not copied from electronic read) Normal Sinus rhthym, No significant ST elevations   Delora Fuel, MD 06/16/2020, 2:44 PM  PGY-1, [place] Intern pager: [phone], text pages welcome   [place]    I have seen and examined this patient.     I have discussed the findings and exam with the intern and agree with the above note, which I have edited appropriately. I helped develop the management plan that is described in the resident's note, and I agree with the content.   Doristine Mango, DO PGY-3 Family Medicine Resident

## 2020-06-16 NOTE — Consult Note (Signed)
[facility] Renal Consultation Note  Requesting MD: Chrisandra Netters, MD Indication for Consultation:  CKD progression  Chief complaint: shortness of breath  HPI:  Albert Harris is a 65 y.o. male with a history CKD, HTN, COPD, PVD, and prior stroke who presented to the hospital with shortness of breath and 3 days of cough and wheezing.  He was found to have covid 19.  He had AVF LUE on 03/28/20 placed with Dr. Carlis Abbott.  Per a 05/2020 note fistula was adequate depth at that time but not adequate diameter for access; plans were for follow-up in one month for reassessment.  He has seen Dr. Posey Pronto and has at times been lost to follow-up with our office - once for a period of 2 years per charting.  He does have a history of medication noncompliance.  He has been on up to 6 liters [place].  Lasix is 40 mg BID per last nephrology recs.  Last seen by Dr. Posey Pronto 04/23/20.  He has been ordered ESA at short stay - last got retacrit 10,000 units on 06/10/20.    Asked team to give lasix 80 mg IV on receipt of consult.  Patient states that he's made a little urine feeling a little better but still short of breath.  His urine output has been less recently.  He has been on 3-4 liters most recently.  Per nursing he had previously been audibly rhonchorous so respiratory status is a little bit better.  we discussed the risks benefits and indications of dialysis and he does consent to dialysis.  He understands that we would like to try and use his fistula which on exam has a good bruit and thrill  Creatinine, Ser  Date/Time Value Ref Range Status  06/16/2020 03:47 PM 15.82 (H) 0.61 - 1.24 mg/dL Final  06/16/2020 12:23 PM 15.93 (H) 0.61 - 1.24 mg/dL Final  06/10/2020 09:00 AM 14.85 (H) 0.61 - 1.24 mg/dL Final  04/29/2020 10:01 AM 11.32 (H) 0.61 - 1.24 mg/dL Final  04/01/2020 10:39 AM 10.02 (H) 0.61 - 1.24 mg/dL Final  03/28/2020 07:17 AM 10.50 (H) 0.61 - 1.24 mg/dL Final  03/04/2020 10:45 AM 9.25 (H) 0.61 -  1.24 mg/dL Final  02/05/2020 10:23 AM 8.36 (H) 0.61 - 1.24 mg/dL Final  01/01/2020 10:41 AM 6.37 (H) 0.61 - 1.24 mg/dL Final  07/23/2019 07:13 AM 5.70 (H) 0.61 - 1.24 mg/dL Final  07/22/2019 02:58 PM 6.44 (H) 0.61 - 1.24 mg/dL Final  07/22/2019 06:20 AM 6.18 (H) 0.61 - 1.24 mg/dL Final  07/21/2019 04:47 AM 5.89 (H) 0.61 - 1.24 mg/dL Final  07/20/2019 04:35 AM 5.83 (H) 0.61 - 1.24 mg/dL Final  07/19/2019 07:22 AM 5.27 (H) 0.61 - 1.24 mg/dL Final  07/18/2019 05:29 AM 5.23 (H) 0.61 - 1.24 mg/dL Final  07/17/2019 06:49 PM 5.38 (H) 0.61 - 1.24 mg/dL Final  12/07/2017 02:17 PM 1.32 (H) 0.76 - 1.27 mg/dL Final  11/10/2017 07:21 AM 1.39 (H) 0.61 - 1.24 mg/dL Final  11/09/2017 02:53 PM 1.40 (H) 0.61 - 1.24 mg/dL Final  11/09/2017 02:39 PM 1.39 (H) 0.61 - 1.24 mg/dL Final  01/19/2017 10:03 AM 1.32 0.40 - 1.50 mg/dL Final  01/05/2017 11:07 AM 1.19 0.40 - 1.50 mg/dL Final  12/25/2016 03:50 AM 1.33 (H) 0.61 - 1.24 mg/dL Final  12/24/2016 06:51 PM 1.33 (H) 0.61 - 1.24 mg/dL Final  03/19/2014 10:28 AM 2.02 (H) 0.50 - 1.35 mg/dL Final  05/08/2008 09:10 AM 1.02 0.4 - 1.5 mg/dL Final  PMHx:   Past Medical History:  Diagnosis Date  . Asthma   . Carotid stenosis 09/12/2019  . Claudication in peripheral vascular disease ([place]) 09/12/2019  . Complication of anesthesia    " i HAVE A HARD TIME WAKING UP "  . COPD (chronic obstructive pulmonary disease) ([place])   . GERD (gastroesophageal reflux disease)   . HLD (hyperlipidemia)   . Hypertension   . PFO with atrial septal aneurysm    by echo 11/2017  . Stroke ([place])   . [name] (transient ischemic attack) 11/2017  . Tobacco abuse    quit 07/17/19    Past Surgical History:  Procedure Laterality Date  . AV FISTULA PLACEMENT Left 03/28/2020   Procedure: ARTERIOVENOUS (AV) FISTULA CREATION LEFT;  Surgeon: [name], MD;  Location: [facility];  Service: Vascular;  Laterality: Left;  . COLONOSCOPY    . FINGER SURGERY      Family Hx:  Family History   Problem Relation Age of Onset  . Hypertension Mother   . Colon cancer Father   . Stomach cancer Brother     Social History:  reports that he quit smoking about 11 months ago. His smoking use included cigarettes. He has a 50.00 pack-year smoking history. He has never used smokeless tobacco. He reports current alcohol use of about 1.0 - 3.0 standard drink of alcohol per week. He reports that he does not use drugs.  Allergies: No Known Allergies  Medications: (list does not appear correct but limited secondary to pt poor historian) Prior to Admission medications   Medication Sig Start Date End Date Taking? Authorizing Provider  acetaminophen (TYLENOL) 650 MG CR tablet Take 1,300 mg by mouth every 8 (eight) hours as needed for pain or fever.   Yes [provider]  albuterol (VENTOLIN HFA) 108 (90 Base) MCG/ACT inhaler Inhale 1-2 puffs into the lungs every 6 (six) hours as needed for wheezing or shortness of breath.   Yes [provider]  amLODipine (NORVASC) 10 MG tablet Take 1 tablet (10 mg total) by mouth daily. 08/30/19  Yes Kerin Perna, NP  furosemide (LASIX) 20 MG tablet Take 20 mg by mouth daily as needed for fluid.    Yes [provider]  metoprolol tartrate (LOPRESSOR) 25 MG tablet Take 25 mg by mouth 2 (two) times daily. 04/23/20  Yes [provider]  nitroGLYCERIN (NITROSTAT) 0.4 MG SL tablet Place 1 tablet (0.4 mg total) under the tongue every 5 (five) minutes x 3 doses as needed for chest pain. Patient taking differently: Place 0.4 mg under the tongue every 5 (five) minutes as needed for chest pain. 07/23/19  Yes Aline August, MD  sodium bicarbonate 650 MG tablet Take 650 mg by mouth daily as needed for heartburn. 02/29/20  Yes [provider]    I have reviewed the patient's reported prior to admission and current medications.  Labs:  BMP Latest Ref Rng & Units 06/16/2020 06/16/2020 06/10/2020  Glucose 70 - 99 mg/dL - 102(H) 108(H)   BUN 8 - 23 mg/dL - 86(H) 82(H)  Creatinine 0.61 - 1.24 mg/dL 15.82(H) 15.93(H) 14.85(H)  BUN/Creat Ratio 10 - 24 - - -  Sodium 135 - 145 mmol/L - 137 143  Potassium 3.5 - 5.1 mmol/L - 4.6 4.3  Chloride 98 - 111 mmol/L - 108 111  CO2 22 - 32 mmol/L - 10(L) 11(L)  Calcium 8.9 - 10.3 mg/dL - 7.7(L) 8.8(L)    ROS:  Pertinent items noted in HPI and  remainder of comprehensive ROS otherwise negative.    Physical Exam: Vitals:   06/16/20 1800 06/16/20 1810  BP:    Pulse: 78   Resp: (!) 35   Temp:  99.7 F (37.6 C)  SpO2: 100%      General: Adult male seated on stretcher shortness of breath with exertion HEENT: Normocephalic atraumatic Eyes: Extraocular movements intact sclera anicteric Neck: supple trachea midline Heart: S1-S2 no rub appreciated Lungs: Decreased breath sounds with occasional crackles unlabored at rest and increased with exertion. Abdomen: Soft nondistended nontender Extremities: Trace edema no cyanosis or clubbing Skin: No rash on extremities exposed; tattoo on left arm  Neuro: Alert and oriented x3 provides a history and follows commands Genitourinary no Foley in place bladder not palpable.  Some urine in a urinal Access left AV fistula with bruit and thrill present   Assessment/Plan:  # CKD stage V with progression to ESRD  - setting of covid superimposed on advanced CKD  - Start HD on 06/17/20 - attempt to use AVF - Will plan for HD on 1/10 and 1/11 and 1/12 then reassess needs daily - If not able to stick AVF on 06/17/20 would need a tunneled catheter - Check strict ins/outs - Will need to initiate CLIP process on 1/10 and note covid positive   # Acute hypoxic respiratory failure - Secondary to covid PNA  - I advised his team to provide 80 mg lasix IV at time of consult and he has received - Repeat lasix 80 mg IV in early AM - ordered  # Covid 19 PNA   - Therapies per primary team - see remdesivir is ordered - optimize volume status   # HTN  -  lasix as above    # Anemia of CKD  - no acute indication for PRBC's  - continue ESA - last got retacrit 10,000 units on 06/10/20 - Will transition to aranesp inpatient and will give 60 mcg dose on 5/85/27     # Metabolic acidosis  - Starting dialysis - Lasix as above  - Sodium bicarb tonight once   # Secondary hyperparathyroidism  - PTH was 201 on 06/10/20 - Check phos in AM; was > 30 on 06/10/20 - agree with renal diet  Claudia Desanctis 06/16/2020, 7:47 PM

## 2020-06-17 DIAGNOSIS — U071 COVID-19: Secondary | ICD-10-CM | POA: Diagnosis not present

## 2020-06-17 DIAGNOSIS — J069 Acute upper respiratory infection, unspecified: Secondary | ICD-10-CM | POA: Diagnosis not present

## 2020-06-17 LAB — CBC WITH DIFFERENTIAL/PLATELET
Abs Immature Granulocytes: 0.4 10*3/uL — ABNORMAL HIGH (ref 0.00–0.07)
Basophils Absolute: 0 10*3/uL (ref 0.0–0.1)
Basophils Relative: 0 %
Eosinophils Absolute: 0 10*3/uL (ref 0.0–0.5)
Eosinophils Relative: 0 %
HCT: 28.2 % — ABNORMAL LOW (ref 39.0–52.0)
Hemoglobin: 8.4 g/dL — ABNORMAL LOW (ref 13.0–17.0)
Immature Granulocytes: 2 %
Lymphocytes Relative: 4 %
Lymphs Abs: 0.7 10*3/uL (ref 0.7–4.0)
MCH: 30.4 pg (ref 26.0–34.0)
MCHC: 29.8 g/dL — ABNORMAL LOW (ref 30.0–36.0)
MCV: 102.2 fL — ABNORMAL HIGH (ref 80.0–100.0)
Monocytes Absolute: 0.5 10*3/uL (ref 0.1–1.0)
Monocytes Relative: 3 %
Neutro Abs: 17.9 10*3/uL — ABNORMAL HIGH (ref 1.7–7.7)
Neutrophils Relative %: 91 %
Platelets: 164 10*3/uL (ref 150–400)
RBC: 2.76 MIL/uL — ABNORMAL LOW (ref 4.22–5.81)
RDW: 16.6 % — ABNORMAL HIGH (ref 11.5–15.5)
WBC: 19.5 10*3/uL — ABNORMAL HIGH (ref 4.0–10.5)
nRBC: 0.2 % (ref 0.0–0.2)

## 2020-06-17 LAB — COMPREHENSIVE METABOLIC PANEL
ALT: 14 U/L (ref 0–44)
AST: 23 U/L (ref 15–41)
Albumin: 2 g/dL — ABNORMAL LOW (ref 3.5–5.0)
Alkaline Phosphatase: 36 U/L — ABNORMAL LOW (ref 38–126)
BUN: 113 mg/dL — ABNORMAL HIGH (ref 8–23)
CO2: <7 mmol/L — ABNORMAL LOW (ref 22–32)
Calcium: 7.4 mg/dL — ABNORMAL LOW (ref 8.9–10.3)
Chloride: 105 mmol/L (ref 98–111)
Creatinine, Ser: 16.72 mg/dL — ABNORMAL HIGH (ref 0.61–1.24)
GFR, Estimated: 3 mL/min — ABNORMAL LOW (ref >60)
Glucose, Bld: 126 mg/dL — ABNORMAL HIGH (ref 70–99)
Potassium: 4.9 mmol/L (ref 3.5–5.1)
Sodium: 136 mmol/L (ref 135–145)
Total Bilirubin: 0.6 mg/dL (ref 0.3–1.2)
Total Protein: 4.8 g/dL — ABNORMAL LOW (ref 6.5–8.1)

## 2020-06-17 LAB — LIPID PANEL
Cholesterol: 134 mg/dL (ref 0–200)
HDL: 58 mg/dL (ref >40)
LDL Cholesterol: 50 mg/dL (ref 0–99)
Total CHOL/HDL Ratio: 2.3 RATIO
Triglycerides: 128 mg/dL (ref <150)
VLDL: 26 mg/dL (ref 0–40)

## 2020-06-17 LAB — C-REACTIVE PROTEIN: CRP: 25 mg/dL — ABNORMAL HIGH (ref <1.0)

## 2020-06-17 LAB — PHOSPHORUS: Phosphorus: >30 mg/dL — ABNORMAL HIGH (ref 2.5–4.6)

## 2020-06-17 LAB — TROPONIN I (HIGH SENSITIVITY): Troponin I (High Sensitivity): 163 ng/L (ref <18)

## 2020-06-17 LAB — D-DIMER, QUANTITATIVE: D-Dimer, Quant: 3.91 ug/mL-FEU — ABNORMAL HIGH (ref 0.00–0.50)

## 2020-06-17 MED ORDER — SEVELAMER CARBONATE 800 MG PO TABS
1600.0000 mg | ORAL_TABLET | Freq: Three times a day (TID) | ORAL | Status: DC
  Administered 2020-06-17 – 2020-06-21 (×5): 1600 mg via ORAL
  Filled 2020-06-17 (×7): qty 2

## 2020-06-17 NOTE — ED Notes (Signed)
SDU Breakfast Ordered 

## 2020-06-17 NOTE — Progress Notes (Signed)
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 Slow start protocol - If not able to stick AVF on 06/17/20 would need a tunneled catheter and VVS consult - Check strict ins/outs - CLIP, will need chair in COVID shift  # Acute hypoxic respiratory failure - Secondary to covid PNA  - Repeated lasix 80 mg IV in early AM - ordered  # Covid 19 PNA   - per primary service - optimize volume status   # HTN  - lasix as above    # Anemia of CKD  - no acute indication for PRBC's  - continue ESA - last got retacrit 10,000 units on 06/10/20 - Will transition to aranesp inpatient and will give 60 mcg dose on 3/32/95     # Metabolic acidosis  - Starting dialysis  # Secondary hyperparathyroidism  - PTH was 201 on 06/10/20 - Check phos in AM; was > 30 on 06/10/20 - agree with renal diet  [name], MD [facility]  Subjective:   No acute events. Starting HD today.   Objective:   BP 132/70   Pulse 64   Temp (!) 97.5 F (36.4 C) (Oral)   Resp (!) 32   Ht 5\' 8"  (1.727 m)   Wt 61.2 kg   SpO2 96%   BMI 20.53 kg/m   Intake/Output Summary (Last 24 hours) at 06/17/2020 1429 Last data filed at 06/16/2020 1914 Gross per 24 hour  Intake 490 ml  Output --  Net 490 ml   Weight change:   Physical Exam: Gen:nad, sitting up in bed CVS:s1s2, rrr Resp:labored, increased WOB, able to speak in full sentences JOA:CZYS, nt/nd AYT:KZSWF edema Neuro: awake, alert, speech clear and coherent, moves all ext spontaneously  Imaging: DG Chest Portable 1 View  Result Date: 06/16/2020 CLINICAL DATA:  Shortness of breath for 4 days. EXAM: PORTABLE CHEST 1 VIEW COMPARISON:  Chest x-ray dated 07/17/2019. FINDINGS: New ill-defined airspace opacities within the  RIGHT mid and lower lung zones. Additional subtle opacity at the LEFT lung base. No pleural effusion or pneumothorax is seen. Stable mild cardiomegaly. IMPRESSION: 1. New multifocal airspace opacities bilaterally, RIGHT greater than LEFT, suspicious for multifocal pneumonia, alternatively asymmetric pulmonary edema pattern. 2. Stable mild cardiomegaly. Electronically Signed   By: Franki Cabot M.D.   On: 06/16/2020 12:54    Labs: BMET Recent Labs  Lab 06/16/20 1223 06/16/20 1547 06/17/20 1046  NA 137  --  136  K 4.6  --  4.9  CL 108  --  105  CO2 10*  --  <7*  GLUCOSE 102*  --  126*  BUN 86*  --  113*  CREATININE 15.93* 15.82* 16.72*  CALCIUM 7.7*  --  7.4*  PHOS  --   --  >30.0*   CBC Recent Labs  Lab 06/16/20 1223 06/16/20 1547 06/17/20 1046  WBC 10.5 12.0* 19.5*  NEUTROABS  --   --  17.9*  HGB 8.5* 8.0* 8.4*  HCT 27.5* 25.2* 28.2*  MCV 98.2 99.2 102.2*  PLT 160 146* 164    Medications:    . amLODipine  10 mg Oral Daily  . Chlorhexidine Gluconate Cloth  6 each Topical Q0600  . darbepoetin (ARANESP) injection -  DIALYSIS  60 mcg Intravenous Q Mon-HD  . dexamethasone  6 mg Oral Q24H  . heparin  5,000 Units Subcutaneous Q8H  . metoprolol tartrate  25 mg Oral BID  . polyethylene glycol  17 g Oral Daily  . sevelamer carbonate  800 mg Oral TID WC      [name], MD [facility] 06/17/2020, 2:29 PM

## 2020-06-17 NOTE — ED Notes (Addendum)
Breakfast tray at the bedside. Pt alert and oriented X4.

## 2020-06-17 NOTE — ED Notes (Signed)
Bedpan emptied. Some blood noted in stool.

## 2020-06-17 NOTE — Progress Notes (Signed)
Family Medicine Teaching Service Daily Progress Note Intern Pager: [phone]  Patient name: Albert Harris Medical record number: [id number] Date of birth: 30-Nov-1955 Age: 65 y.o. Gender: male  Primary Care Provider: Kerin Perna, NP Consultants: Nephro Code Status: Full  Pt Overview and Major Events to Date:  Admitted 1/10  Assessment and Plan: JONHATAN HEARTY is a 65 y.o. male presenting with difficulty breathing. PMH is significant for COPD, CKD Stage V, HFpEF, PAD, [name], HTN, HLD, and Carotid Stenosis.    Acute Hypoxic Respiratory Failure 2/2 COVID-19 pneumonia, meeting sepsis criteria Patient satting at 99% on RA.  Still some increased owkr of breathing  D-Dimer pending.  CRP pending. Touched base with Cards given Elevated Trops who indicated they would not need to see patient unless Trops became significantly more elevated.  If clinically worsening will consider getting CT or CTA after touching base with Nephro. - Maintain O2 sats > 90% - Continuous Pulse Ox - Vital signs q4hr - AM CMP, CBC with Diff, D-Dimer, CRP - Albuterol q4hr - Continue Remdesevir day 2/5 - Continue Decadron 6 mg daily day 2/10 - f/u UCx, and BCx - Strict I's/O's - incentive spirometer - prone as tolerated   BRBPR Hgb increased to 8.4 from 8 last night.  Patient indicated he felt had a little blood in it.  Nurse and nurse tech did not report finding any blood in patient's stool. Was not able to touch base with GI. - Continue daily miralax - AM CBC  - Plan to reach out to GI tomorrow,  - Closely onitor bowel movements  CKDV Nephro following.  Plans for dialysis for next 3 days.  CMP pending at this time.  Continues to make good urine.  Ordered repeat IV Lasix 80 mg. - Nephro following, appreciate recs - Avoid renal toxic medications - AM CMP - fluid restriction - strict I/O - f/u urinalysis  HTN  Poorly controlled in past.  SBP has been 150-190.  Takes Amlodipine 10 mg and Metoprolol 25  mg BID at home.  Was previously prescribed Imdur, Doxazosin, and Hydralazine but not currently taking.   - Continue Amlodipine and Metoprolol - Hold Imdur, Doxazosin, and Hydralazine  HFpEF Echo from 2/21 showed EF of 60-65%.  Found to have Diastolic Dysfunction Grade II.  Currently on Metoprolol 25 mg BID at home.  Was previously on Lasix 20 mg.   - Continue Metoprolol 25 mg BID  Severe PAD  carotid stenosis- poor candidate for intervention given kidney function, per patient. Increased concern for elevated troponins as can be used as marker for CAD - PT/OT  HLD Previously on Statin therapy.  Lipid Panel pending - begin atorvastatin 40  FEN/GI: Renal Diet Prophylaxis: Heparin  Status is: Inpatient  Remains inpatient appropriate because:Inpatient level of care appropriate due to severity of illness   Dispo: The patient is from: Home              Anticipated d/c is to: Home              Anticipated d/c date is: 3 days              Patient currently is not medically stable to d/c.     Subjective:  Patient indicates still having difficulty breathing.  Tolerating PO well still.  Objective: Temp:  [97.5 F (36.4 C)-100.6 F (38.1 C)] 97.5 F (36.4 C) (01/10 0345) Pulse Rate:  [63-82] 82 (01/10 0645) Resp:  [16-40] 29 (01/10 0645) BP: (142-169)/(64-96)  162/96 (01/10 0645) SpO2:  [77 %-100 %] 100 % (01/10 0645) Weight:  [61.2 kg] 61.2 kg (01/09 1204)   Physical Exam: Physical Exam Constitutional:      General: He is not in acute distress.    Appearance: Normal appearance. He is not ill-appearing.  HENT:     Head: Normocephalic and atraumatic.     Mouth/Throat:     Mouth: Mucous membranes are moist.  Cardiovascular:     Rate and Rhythm: Normal rate.     Pulses: Normal pulses.  Pulmonary:     Comments: Increased work of breathing, wheezing resolved, currently on RA Abdominal:     General: Abdomen is flat.     Palpations: Abdomen is soft.     Tenderness: There  is no abdominal tenderness.  Skin:    General: Skin is warm.  Neurological:     General: No focal deficit present.     Mental Status: He is alert and oriented to person, place, and time.     Laboratory: Recent Labs  Lab 06/10/20 0912 06/16/20 1223 06/16/20 1547  WBC  --  10.5 12.0*  HGB 9.4* 8.5* 8.0*  HCT  --  27.5* 25.2*  PLT  --  160 146*   Recent Labs  Lab 06/10/20 0900 06/16/20 1223 06/16/20 1547  NA 143 137  --   K 4.3 4.6  --   CL 111 108  --   CO2 11* 10*  --   BUN 82* 86*  --   CREATININE 14.85* 15.93* 15.82*  CALCIUM 8.8*  8.5* 7.7*  --   PROT  --  5.0*  --   BILITOT  --  0.2*  --   ALKPHOS  --  37*  --   ALT  --  11  --   AST  --  15  --   GLUCOSE 108* 102*  --      Imaging/Diagnostic Tests: No new imaging  Delora Fuel, MD 06/17/2020, 7:44 AM PGY-1, [place] Intern pager: [phone], text pages welcome

## 2020-06-17 NOTE — ED Notes (Signed)
Lunch Tray Ordered 1115. 

## 2020-06-17 NOTE — ED Notes (Signed)
Dinner Trays Ordered @ [phone].

## 2020-06-18 ENCOUNTER — Inpatient Hospital Stay (HOSPITAL_COMMUNITY): Payer: Medicare Other

## 2020-06-18 ENCOUNTER — Encounter (HOSPITAL_COMMUNITY): Payer: Self-pay | Admitting: Student in an Organized Health Care Education/Training Program

## 2020-06-18 DIAGNOSIS — U071 COVID-19: Secondary | ICD-10-CM

## 2020-06-18 DIAGNOSIS — J9601 Acute respiratory failure with hypoxia: Secondary | ICD-10-CM

## 2020-06-18 DIAGNOSIS — N185 Chronic kidney disease, stage 5: Secondary | ICD-10-CM | POA: Diagnosis not present

## 2020-06-18 DIAGNOSIS — N179 Acute kidney failure, unspecified: Secondary | ICD-10-CM | POA: Diagnosis not present

## 2020-06-18 DIAGNOSIS — I739 Peripheral vascular disease, unspecified: Secondary | ICD-10-CM

## 2020-06-18 DIAGNOSIS — J449 Chronic obstructive pulmonary disease, unspecified: Secondary | ICD-10-CM

## 2020-06-18 LAB — I-STAT ARTERIAL BLOOD GAS, ED
Acid-base deficit: 20 mmol/L — ABNORMAL HIGH (ref 0.0–2.0)
Bicarbonate: 7.1 mmol/L — ABNORMAL LOW (ref 20.0–28.0)
Calcium, Ion: 0.93 mmol/L — ABNORMAL LOW (ref 1.15–1.40)
HCT: 28 % — ABNORMAL LOW (ref 39.0–52.0)
Hemoglobin: 9.5 g/dL — ABNORMAL LOW (ref 13.0–17.0)
O2 Saturation: 92 %
Patient temperature: 97.1
Potassium: 5.9 mmol/L — ABNORMAL HIGH (ref 3.5–5.1)
Sodium: 135 mmol/L (ref 135–145)
TCO2: 8 mmol/L — ABNORMAL LOW (ref 22–32)
pCO2 arterial: 20.1 mmHg — ABNORMAL LOW (ref 32.0–48.0)
pH, Arterial: 7.152 — CL (ref 7.350–7.450)
pO2, Arterial: 78 mmHg — ABNORMAL LOW (ref 83.0–108.0)

## 2020-06-18 LAB — LACTIC ACID, PLASMA: Lactic Acid, Venous: 1 mmol/L (ref 0.5–1.9)

## 2020-06-18 LAB — COMPREHENSIVE METABOLIC PANEL
ALT: 18 U/L (ref 0–44)
AST: 27 U/L (ref 15–41)
Albumin: 1.8 g/dL — ABNORMAL LOW (ref 3.5–5.0)
Alkaline Phosphatase: 46 U/L (ref 38–126)
Anion gap: 24 — ABNORMAL HIGH (ref 5–15)
BUN: 131 mg/dL — ABNORMAL HIGH (ref 8–23)
CO2: 7 mmol/L — ABNORMAL LOW (ref 22–32)
Calcium: 6.9 mg/dL — ABNORMAL LOW (ref 8.9–10.3)
Chloride: 105 mmol/L (ref 98–111)
Creatinine, Ser: 17.14 mg/dL — ABNORMAL HIGH (ref 0.61–1.24)
GFR, Estimated: 3 mL/min — ABNORMAL LOW (ref >60)
Glucose, Bld: 107 mg/dL — ABNORMAL HIGH (ref 70–99)
Potassium: 5.8 mmol/L — ABNORMAL HIGH (ref 3.5–5.1)
Sodium: 136 mmol/L (ref 135–145)
Total Bilirubin: 0.4 mg/dL (ref 0.3–1.2)
Total Protein: 4.7 g/dL — ABNORMAL LOW (ref 6.5–8.1)

## 2020-06-18 LAB — CBC WITH DIFFERENTIAL/PLATELET
Abs Immature Granulocytes: 0 10*3/uL (ref 0.00–0.07)
Basophils Absolute: 0 10*3/uL (ref 0.0–0.1)
Basophils Relative: 0 %
Eosinophils Absolute: 0 10*3/uL (ref 0.0–0.5)
Eosinophils Relative: 0 %
HCT: 23.9 % — ABNORMAL LOW (ref 39.0–52.0)
Hemoglobin: 7.8 g/dL — ABNORMAL LOW (ref 13.0–17.0)
Lymphocytes Relative: 1 %
Lymphs Abs: 0.2 10*3/uL — ABNORMAL LOW (ref 0.7–4.0)
MCH: 31.2 pg (ref 26.0–34.0)
MCHC: 32.6 g/dL (ref 30.0–36.0)
MCV: 95.6 fL (ref 80.0–100.0)
Monocytes Absolute: 0 10*3/uL — ABNORMAL LOW (ref 0.1–1.0)
Monocytes Relative: 0 %
Neutro Abs: 15.6 10*3/uL — ABNORMAL HIGH (ref 1.7–7.7)
Neutrophils Relative %: 99 %
Platelets: 165 10*3/uL (ref 150–400)
RBC: 2.5 MIL/uL — ABNORMAL LOW (ref 4.22–5.81)
RDW: 16.3 % — ABNORMAL HIGH (ref 11.5–15.5)
WBC: 15.8 10*3/uL — ABNORMAL HIGH (ref 4.0–10.5)
nRBC: 0 /100 WBC
nRBC: 0.6 % — ABNORMAL HIGH (ref 0.0–0.2)

## 2020-06-18 LAB — URINE CULTURE: Culture: NO GROWTH

## 2020-06-18 LAB — HEPATITIS B SURFACE ANTIGEN: Hepatitis B Surface Ag: NONREACTIVE

## 2020-06-18 LAB — C-REACTIVE PROTEIN: CRP: 25.2 mg/dL — ABNORMAL HIGH (ref <1.0)

## 2020-06-18 LAB — TROPONIN I (HIGH SENSITIVITY)
Troponin I (High Sensitivity): 102 ng/L (ref <18)
Troponin I (High Sensitivity): 85 ng/L — ABNORMAL HIGH (ref <18)

## 2020-06-18 LAB — PREPARE RBC (CROSSMATCH)

## 2020-06-18 MED ORDER — CALCIUM GLUCONATE-NACL 1-0.675 GM/50ML-% IV SOLN
1.0000 g | Freq: Once | INTRAVENOUS | Status: AC
  Administered 2020-06-18: 1000 mg via INTRAVENOUS
  Filled 2020-06-18: qty 50

## 2020-06-18 MED ORDER — NEPRO/CARBSTEADY PO LIQD
237.0000 mL | Freq: Two times a day (BID) | ORAL | Status: DC
  Administered 2020-06-19 – 2020-06-25 (×8): 237 mL via ORAL

## 2020-06-18 MED ORDER — PNEUMOCOCCAL VAC POLYVALENT 25 MCG/0.5ML IJ INJ
0.5000 mL | INJECTION | INTRAMUSCULAR | Status: DC
  Filled 2020-06-18: qty 0.5

## 2020-06-18 MED ORDER — SODIUM ZIRCONIUM CYCLOSILICATE 10 G PO PACK: 10.0000 g | PACK | Freq: Once | ORAL | Status: DC

## 2020-06-18 MED ORDER — FUROSEMIDE 10 MG/ML IJ SOLN
120.0000 mg | Freq: Once | INTRAVENOUS | Status: AC
  Administered 2020-06-18: 120 mg via INTRAVENOUS
  Filled 2020-06-18: qty 2

## 2020-06-18 MED ORDER — FUROSEMIDE 10 MG/ML IJ SOLN
120.0000 mg | Freq: Once | INTRAVENOUS | Status: DC
  Filled 2020-06-18: qty 12

## 2020-06-18 MED ORDER — IPRATROPIUM-ALBUTEROL 0.5-2.5 (3) MG/3ML IN SOLN: 3.0000 mL | Freq: Four times a day (QID) | RESPIRATORY_TRACT | Status: DC

## 2020-06-18 MED ORDER — HYDROMORPHONE HCL 1 MG/ML IJ SOLN: 0.5000 mg | INTRAMUSCULAR | Status: DC | PRN

## 2020-06-18 MED ORDER — FENTANYL CITRATE (PF) 100 MCG/2ML IJ SOLN
25.0000 ug | Freq: Once | INTRAMUSCULAR | Status: AC
  Administered 2020-06-18: 25 ug via INTRAVENOUS
  Filled 2020-06-18: qty 2

## 2020-06-18 MED ORDER — FUROSEMIDE 10 MG/ML IJ SOLN
80.0000 mg | Freq: Once | INTRAMUSCULAR | Status: AC
  Administered 2020-06-18: 80 mg via INTRAVENOUS
  Filled 2020-06-18: qty 8

## 2020-06-18 MED ORDER — SODIUM CHLORIDE 0.9 % IV SOLN
2.0000 g | Freq: Two times a day (BID) | INTRAVENOUS | Status: DC
  Administered 2020-06-18 – 2020-06-19 (×2): 2 g via INTRAVENOUS
  Filled 2020-06-18 (×2): qty 2
  Filled 2020-06-18 (×3): qty 2000

## 2020-06-18 MED ORDER — FENTANYL CITRATE (PF) 100 MCG/2ML IJ SOLN
25.0000 ug | INTRAMUSCULAR | Status: AC | PRN
Start: 2020-06-18 — End: 2020-06-19
  Administered 2020-06-18 – 2020-06-19 (×3): 25 ug via INTRAVENOUS
  Filled 2020-06-18 (×3): qty 2

## 2020-06-18 MED ORDER — SODIUM CHLORIDE 0.9% IV SOLUTION: Freq: Once | INTRAVENOUS | Status: DC

## 2020-06-18 MED ORDER — SODIUM BICARBONATE 8.4 % IV SOLN
INTRAVENOUS | Status: DC
  Filled 2020-06-18 (×4): qty 850

## 2020-06-18 MED ORDER — IOHEXOL 350 MG/ML SOLN
100.0000 mL | Freq: Once | INTRAVENOUS | Status: AC | PRN
  Administered 2020-06-18: 100 mL via INTRAVENOUS

## 2020-06-18 MED ORDER — INFLUENZA VAC A&B SA ADJ QUAD 0.5 ML IM PRSY
0.5000 mL | PREFILLED_SYRINGE | INTRAMUSCULAR | Status: DC
  Filled 2020-06-18: qty 0.5

## 2020-06-18 NOTE — Progress Notes (Signed)
Renal Navigator is aware of patient's need for outpatient HD referral for ESRD and COVID positive status. Currently there are no available seats in the [place] area and Navigator is monitoring availability as patient's HD is initiated and COVID is treated. Navigator following closely.  Alphonzo Cruise, [place] [facility] Navigator [phone]

## 2020-06-18 NOTE — Progress Notes (Signed)
Went to see patient as he now requiring 6L of O2 after desaturations and increased work of breathing per Nurse.  Patient also being seen by CCM who felt it was more appropriate to admit patient to Progressive at this time. Patient more alert than from previous examination a few hours ago.  Currently saturating at 99% on 6L [place].  Exam otherwise unchanged.  Delora Fuel, MD 06/18/2020, 6:37PM PGY-1, [place] Medicine Service pager [phone]

## 2020-06-18 NOTE — Progress Notes (Signed)
[facility] Progress Note    Assessment/ Plan:   # CKD stage V with progression to ESRD  - setting of covid superimposed on advanced CKD   - Start HD today on 06/18/20 - attempt to use AVF - Will plan for HD on 1/11 and 1/12 and 1/13 then reassess needs daily. Slow start protocol - If not able to stick AVF on 06/17/20 would need a tunneled catheter and VVS consult (seems small in caliber) - Check strict ins/outs - CLIP, will need chair in COVID shift  # Acute hypoxic respiratory failure - Secondary to covid PNA   # Covid 19 PNA   - per primary service - optimize volume status   # HTN  - lasix as above    # Anemia of CKD  - continue ESA - last got retacrit 10,000 units on 06/10/20 - Will transition to aranesp inpatient and will give 60 mcg dose on 5/57/32     # Metabolic acidosis  - Starting dialysis  # Secondary hyperparathyroidism  - PTH was 201 on 06/10/20. Phos >30 -renvela, dose increased - agree with renal diet  #Hyperkalemia -HD today, lokelma x 1 dose -if unable to stick AVF, then would recommend lokelma tid and serial labs until dialysis is sorted out  [name], MD [facility]  Subjective:   Due to the [place] pandemic, in attempts to limit transmission and over-utilization of resources (e.g. PPE), the patient was seen virtually today by means of chart review, discussion with staff, and virtual discussion with patient as needed.  No acute events. Starting HD today. Delayed start due to dialysis staffing issues.   Objective:   BP (!) 148/71   Pulse 61   Temp 98.9 F (37.2 C) (Oral)   Resp (!) 29   Ht 5\' 8"  (1.727 m)   Wt 61.2 kg   SpO2 99%   BMI 20.53 kg/m  No intake or output data in the 24 hours ending 06/18/20 1125 Weight change:   Physical Exam: Not examined due to the COVID pandemic  Imaging: DG Chest Portable 1 View  Result Date: 06/16/2020 CLINICAL DATA:  Shortness of breath for 4 days. EXAM: PORTABLE  CHEST 1 VIEW COMPARISON:  Chest x-ray dated 07/17/2019. FINDINGS: New ill-defined airspace opacities within the RIGHT mid and lower lung zones. Additional subtle opacity at the LEFT lung base. No pleural effusion or pneumothorax is seen. Stable mild cardiomegaly. IMPRESSION: 1. New multifocal airspace opacities bilaterally, RIGHT greater than LEFT, suspicious for multifocal pneumonia, alternatively asymmetric pulmonary edema pattern. 2. Stable mild cardiomegaly. Electronically Signed   By: Franki Cabot M.D.   On: 06/16/2020 12:54   CT Angio Chest/Abd/Pel for Dissection W and/or W/WO  Result Date: 06/18/2020 CLINICAL DATA:  65 year old male with sudden onset severe pain radiating to the back. Generalized weakness. Recent shortness of breath for 5 or 6 days. EXAM: CT ANGIOGRAPHY CHEST, ABDOMEN AND PELVIS TECHNIQUE: Precontrast CT chest followed by Multidetector CT imaging through the chest, abdomen and pelvis was performed using the standard protocol during bolus administration of intravenous contrast. Multiplanar reconstructed images and MIPs were obtained and reviewed to evaluate the vascular anatomy. CONTRAST:  115mL OMNIPAQUE IOHEXOL 350 MG/ML SOLN COMPARISON:  Portable chest x-ray 06/16/2020 and earlier. No prior chest, abdomen and Pelvis CT. CTA head and neck 12/24/2016 FINDINGS: CTA CHEST FINDINGS Cardiovascular: Calcified coronary artery and thoracic aortic atherosclerosis. Negative for thoracic aortic dissection or aneurysm. Proximal right vertebral artery severe stenosis versus occlusion (series 6, image  7), progressed since the 2018 CTA where severe stenosis was demonstrated. Other proximal great vessels appear to remain patent. Borderline to mild cardiomegaly.  No pericardial effusion. Central pulmonary artery and major bilateral pulmonary artery branches also appear to remain patent. Mediastinum/Nodes: Negative. Lungs/Pleura: Major airways are patent. Widely scattered bilateral peribronchial and  peripheral pulmonary ground-glass opacity, trending toward consolidation in both lower lobe posterior basal segments, the medial segment of the right middle lobe. Consolidation also in the anterior basal segment of the left lower lobe. Left upper lobe and right lung apex relatively spared. No pleural effusion. Musculoskeletal: No acute osseous abnormality identified. Review of the MIP images confirms the above findings. CTA ABDOMEN AND PELVIS FINDINGS VASCULAR Extensive Aortoiliac calcified atherosclerosis. Negative for abdominal aortic aneurysm or dissection. Bulky soft plaque in the proximal SMA (series 6, image 110) which appears moderately to severely stenotic but remains patent. Other major aortic branches appear patent. Bilateral main renal artery stenosis also suspected. Bilateral proximal internal iliac arteries appear occluded due to plaque but are probably reconstituted distally. Bilateral external iliac arteries are patent but with severe bilateral stenosis (series 6, image 174 on the right). The right superficial femoral artery is occluded from its origin (series 6, image 209). The left SFA is patent but severely stenotic (image 217). Proximal deep femoral arteries appear patent. Review of the MIP images confirms the above findings. NON-VASCULAR Hepatobiliary: Contracted gallbladder. Liver enhancement within normal limits. No bile duct enlargement. Pancreas: Partially degraded by motion artifact, no pancreatic mass or inflammation is evident. Spleen: Negative. Adrenals/Urinary Tract: Normal adrenal glands. Bilateral renal vascular calcifications. No hydronephrosis. Renal enhancement appears symmetric. Ureters and urinary bladder are decompressed. Pelvic phleboliths greater on the left. Stomach/Bowel: Decompressed sigmoid colon and rectum. Extensive sigmoid diverticulosis, although no convincing active inflammation. Mild to moderate descending diverticulosis with no inflammation identified. Mild motion  artifact through the transverse colon which appears negative. Decompressed right colon. Normal gas containing appendix. The cecum is decompressed. Negative terminal ileum. No dilated small bowel. Motion artifact in the upper abdomen. Stomach and duodenum are grossly normal. No pneumoperitoneum. No free fluid in the abdomen. Lymphatic: No lymphadenopathy. Reproductive: Negative. Other: Nonspecific small volume pelvic free fluid with simple fluid density (series 6, image 190). Musculoskeletal: No acute osseous abnormality identified. Review of the MIP images confirms the above findings. IMPRESSION: 1. Negative for aortic dissection or aneurysm. Aortic Atherosclerosis (ICD10-I70.0). 2. Positive for bilateral pulmonary opacity most compatible with acute viral/atypical respiratory infection, suspicious for COVID-19. No pleural effusion. 3. Positive also for multiple atherosclerotic occlusions and high-grade stenoses: - occluded right superficial femoral artery. - occluded bilateral internal iliac arteries. - severe stenosis bilateral external iliac arteries, left SFA. - progressed severe stenosis versus occlusion of the proximal right vertebral artery since a 2018 Neck CTA. - bulky soft plaque with moderate to severe stenosis in the proximal SMA. 4. Small volume of pelvic free fluid is abnormal but nonspecific, with no superimposed inflammatory process identified in the abdomen or pelvis. 5. Diverticulosis of the descending and sigmoid colon. Electronically Signed   By: Genevie Ann M.D.   On: 06/18/2020 08:51    Labs: BMET Recent Labs  Lab 06/16/20 1223 06/16/20 1547 06/17/20 1046 06/18/20 0343  NA 137  --  136 136  K 4.6  --  4.9 5.8*  CL 108  --  105 105  CO2 10*  --  <7* 7*  GLUCOSE 102*  --  126* 107*  BUN 86*  --  113* 131*  CREATININE 15.93* 15.82* 16.72* 17.14*  CALCIUM 7.7*  --  7.4* 6.9*  PHOS  --   --  >30.0*  --    CBC Recent Labs  Lab 06/16/20 1223 06/16/20 1547 06/17/20 1046  06/18/20 0343  WBC 10.5 12.0* 19.5* 15.8*  NEUTROABS  --   --  17.9* 15.6*  HGB 8.5* 8.0* 8.4* 7.8*  HCT 27.5* 25.2* 28.2* 23.9*  MCV 98.2 99.2 102.2* 95.6  PLT 160 146* 164 165    Medications:    . sodium chloride   Intravenous Once  . amLODipine  10 mg Oral Daily  . Chlorhexidine Gluconate Cloth  6 each Topical Q0600  . darbepoetin (ARANESP) injection - DIALYSIS  60 mcg Intravenous Q Mon-HD  . dexamethasone  6 mg Oral Q24H  . metoprolol tartrate  25 mg Oral BID  . polyethylene glycol  17 g Oral Daily  . sevelamer carbonate  1,600 mg Oral TID WC      [name], MD [facility] 06/18/2020, 11:25 AM

## 2020-06-18 NOTE — Consult Note (Addendum)
[facility] Surgery Consult Note  Albert Harris June 23, 1955  [id number].    Requesting MD: Albert Harris Chief Complaint; bright red rectal bleeding, epigastric pain, shortness of breath 06/16/2020 Reason for Consult:  Abdominal pain with possible ischemia  HPI:  Patient is a 65 year old male presented ED with the above-noted complaints.  He had 3 days of worsening cough, wheezing and shortness of breath.  He has a history of significant atherosclerotic arterial disease, end-stage renal disease but not yet on hemodialysis, history of tobacco abuse, PFO with atrial septal aneurysm, also history of hyperlipidemia.  Patient was in respiratory distress.  He is not vaccinated against COVID in the ED he was on 6 L nasal cannula with O2 sats between 86% and 92%.  He has a history of respiratory problems but is not on O2 at home.  Work-up in the ED shows a temperature of 100.6 he was not tachycardic blood pressure was mildly elevated but stable.  Labs showed potassium of 4.9, glucose of 126, BUN of 113, creatinine of 16.72, phosphorus was less than 30.  Alk phos 36, albumin 2.0, total protein 4.8.  LFTs were normal.  CRP was 25, WBC 19.5, hemoglobin 8.4, hematocrit 28.2, platelets 164,000. COVID was positive.  Chest x-ray showed new multifocal airspace opacities bilaterally right greater than left suspicious for multifocal pneumonia alternative is asymmetric pulmonary edema with stable cardiomegaly. Troponin 182>>195>>163>>102(06/19/19)  Patient is still waiting in the ER for a bed.  He was seen by the housestaff this morning complaining of significant abdominal pain.  He continues to complain of shortness of breath and chest pain which he says is worse than last night.  Patient states his abdominal pain is radiating to his back. He tells me it is the same pain he had when he came in.  He was able to eat 1/9 and 1/10 without increased pain.  His exam changes and one minute pain is not bad and the next he is  complaining of diffuse pain.    Labs today shows his potassium is 5.8, his creatinine is up to 17.14, WBC is 15.8, Hemoglobin 7.8, hematocrit 23.9, platelets are 165,000.  CT angio was obtained today.  Patient has extensive aortoiliac atherosclerosis.  There is no abdominal aortic aneurysm or dissection.  Bulky soft plaque in the proximal SMA which appears moderately stenotic but is patent.  He has bilateral proximal iliac artery occlusions but are reconstituted distally.  The external iliac arteries are patent but with severe bilateral stenosis. The right SFA is occluded from its origin the left SFA is patent but severely stenotic.  The deep femoral arteries are patent. There is mild to moderate descending diverticulosis with no inflammation.  The stomach and small bowel show no gross abnormalities.  He does have bilateral pulmonary opacities compatible with acute viral/atypical respiratory infection.  We are asked to see.  ROS: Review of Systems  Unable to perform ROS: Medical condition    Family History  Problem Relation Age of Onset  . Hypertension Mother   . Colon cancer Father   . Stomach cancer Brother     Past Medical History:  Diagnosis Date  . Asthma   . Carotid stenosis 09/12/2019  . Claudication in peripheral vascular disease ([place]) 09/12/2019  . Complication of anesthesia    " i HAVE A HARD TIME WAKING UP "  . COPD (chronic obstructive pulmonary disease) ([place])   . GERD (gastroesophageal reflux disease)   . HLD (hyperlipidemia)   . Hypertension   .  PFO with atrial septal aneurysm    by echo 11/2017  . Stroke (Albert Harris)   . [name] (transient ischemic attack) 11/2017  . Tobacco abuse    quit 07/17/19    Past Surgical History:  Procedure Laterality Date  . AV FISTULA PLACEMENT Left 03/28/2020   Procedure: ARTERIOVENOUS (AV) FISTULA CREATION LEFT;  Surgeon: [name], MD;  Location: [facility];  Service: Vascular;  Laterality: Left;  . COLONOSCOPY    . FINGER SURGERY       Social History:  reports that he quit smoking about 11 months ago. His smoking use included cigarettes. He has a 50.00 pack-year smoking history. He has never used smokeless tobacco. He reports current alcohol use of about 1.0 - 3.0 standard drink of alcohol per week. He reports that he does not use drugs.  Allergies: No Known Allergies  Prior to Admission medications   Medication Sig Start Date End Date Taking? Authorizing Provider  acetaminophen (TYLENOL) 650 MG CR tablet Take 1,300 mg by mouth every 8 (eight) hours as needed for pain or fever.   Yes [provider]  albuterol (VENTOLIN HFA) 108 (90 Base) MCG/ACT inhaler Inhale 1-2 puffs into the lungs every 6 (six) hours as needed for wheezing or shortness of breath.   Yes [provider]  amLODipine (NORVASC) 10 MG tablet Take 1 tablet (10 mg total) by mouth daily. 08/30/19  Yes Kerin Perna, NP  furosemide (LASIX) 20 MG tablet Take 20 mg by mouth daily as needed for fluid.    Yes [provider]  metoprolol tartrate (LOPRESSOR) 25 MG tablet Take 25 mg by mouth 2 (two) times daily. 04/23/20  Yes [provider]  nitroGLYCERIN (NITROSTAT) 0.4 MG SL tablet Place 1 tablet (0.4 mg total) under the tongue every 5 (five) minutes x 3 doses as needed for chest pain. Patient taking differently: Place 0.4 mg under the tongue every 5 (five) minutes as needed for chest pain. 07/23/19  Yes Aline August, MD  sodium bicarbonate 650 MG tablet Take 650 mg by mouth daily as needed for heartburn. 02/29/20  Yes [provider]     Blood pressure (!) 148/71, pulse 61, temperature 98.9 F (37.2 C), temperature source Oral, resp. rate (!) 29, height $RemoveBe'5\' 8"'[id number]$  (1.727 m), weight 61.2 kg, SpO2 99 %. Physical Exam:  General: pleasant, chronically and acutely ill cachectic white male who is laying in bed in respiratory distress, despite adequate saturations on room air HEENT: head is normocephalic, atraumatic.   Sclera are noninjected.  Pupils are equal. Ears and nose without any masses or lesions.  Mouth is pink and moist Heart: difficult to hear over pulmonary sounds No obvious murmurs, gallops, or rubs noted.  No distal pulses felt in either foot.  The AV fistula in the left arm is present with a thrill but not developed.   Lungs:  Bilateral rales and ronchi, he looks like he is in pulmonary edema, on exam, but maintaining saturations on room air Abd: soft, he is tender intermittently diffusely, ND, +BS, no masses, hernias, or organomegaly.  No prior surgeries. MS: all 4 extremities are symmetrical with no cyanosis, both feet are cool and no distal pulses. Skin: warm and dry with no masses, lesions, or rashes Neuro: Cranial nerves 2-12 grossly intact, sensation is normal throughout Psych: A&Ox3 with an appropriate affect.   Results for orders placed or performed during the hospital encounter of 06/16/20 (from the past 48 hour(s))  Comprehensive metabolic panel  Status: Abnormal   Collection Time: 06/16/20 12:23 PM  Result Value Ref Range   Sodium 137 135 - 145 mmol/L   Potassium 4.6 3.5 - 5.1 mmol/L   Chloride 108 98 - 111 mmol/L   CO2 10 (L) 22 - 32 mmol/L   Glucose, Bld 102 (H) 70 - 99 mg/dL    Comment: Glucose reference range applies only to samples taken after fasting for at least 8 hours.   BUN 86 (H) 8 - 23 mg/dL   Creatinine, Ser 15.93 (H) 0.61 - 1.24 mg/dL   Calcium 7.7 (L) 8.9 - 10.3 mg/dL   Total Protein 5.0 (L) 6.5 - 8.1 g/dL   Albumin 2.2 (L) 3.5 - 5.0 g/dL   AST 15 15 - 41 U/L   ALT 11 0 - 44 U/L   Alkaline Phosphatase 37 (L) 38 - 126 U/L   Total Bilirubin 0.2 (L) 0.3 - 1.2 mg/dL   GFR, Estimated 3 (L) >60 mL/min    Comment: (NOTE) Calculated using the CKD-EPI Creatinine Equation (2021)    Anion gap 19 (H) 5 - 15    Comment: Performed at [facility], [place] [address], [place], [place] [address]  CBC     Status: Abnormal   Collection Time: 06/16/20 12:23 PM  Result  Value Ref Range   WBC 10.5 4.0 - 10.5 K/uL   RBC 2.80 (L) 4.22 - 5.81 MIL/uL   Hemoglobin 8.5 (L) 13.0 - 17.0 g/dL   HCT 27.5 (L) 39.0 - 52.0 %   MCV 98.2 80.0 - 100.0 fL   MCH 30.4 26.0 - 34.0 pg   MCHC 30.9 30.0 - 36.0 g/dL   RDW 16.4 (H) 11.5 - 15.5 %   Platelets 160 150 - 400 K/uL   nRBC 0.2 0.0 - 0.2 %    Comment: Performed at [facility], [place] [address] [place], [place] [address]  Type and screen [place]     Status: None (Preliminary result)   Collection Time: 06/16/20 12:23 PM  Result Value Ref Range   ABO/RH(D) O POS    Antibody Screen NEG    Sample Expiration 06/19/2020,2359    Unit Number [id number]    Blood Component Type RED CELLS,LR    Unit division 00    Status of Unit ISSUED    Transfusion Status OK TO TRANSFUSE    Crossmatch Result      Compatible Performed at [facility], [place] [address] [place], [place] [address]   Troponin I (High Sensitivity)     Status: Abnormal   Collection Time: 06/16/20 12:23 PM  Result Value Ref Range   Troponin I (High Sensitivity) 183 (HH) <18 ng/L    Comment: CRITICAL RESULT CALLED TO, READ BACK BY AND VERIFIED WITH: BRITTANY HONEYCUTT RN.@1339  ON 1.9.22 BY TCALDWELL MT. (NOTE) Elevated high sensitivity troponin I (hsTnI) values and significant  changes across serial measurements may suggest ACS but many other  chronic and acute conditions are known to elevate hsTnI results.  Refer to the Links section for chest pain algorithms and additional  guidance. Performed at [facility], [place] [address], [place], [place] [address] CORRECTED ON 01/09 AT 1519: PREVIOUSLY REPORTED AS 183 CRITICAL RESULT CALLED TO, READ BACK BY AND VERIFIED WITH: BRITTANY HONEYCUTT RN.@1339  ON 1.8.22 BY TCALDWELL MT.   ABO/Rh     Status: None   Collection Time: 06/16/20  1:15 PM  Result Value Ref Range   ABO/RH(D)  O POS Performed at [facility], [place] [address] [place], [place] [address]   POC  occult blood, ED     Status: None   Collection Time: 06/16/20  1:43 PM  Result Value Ref Range   Fecal Occult Bld NEGATIVE NEGATIVE  Blood Culture (routine x 2)     Status: Abnormal (Preliminary result)   Collection Time: 06/16/20  1:49 PM   Specimen: BLOOD RIGHT HAND  Result Value Ref Range   Specimen Description BLOOD RIGHT HAND    Special Requests      BOTTLES DRAWN AEROBIC AND ANAEROBIC Blood Culture results may not be optimal due to an inadequate volume of blood received in culture bottles   Culture  Setup Time      GRAM NEGATIVE RODS ANAEROBIC BOTTLE ONLY CRITICAL RESULT CALLED TO, READ BACK BY AND VERIFIED WITH: PHARMD J [place] [id number] AT 1103 AM BY CM    Culture (A)     HAEMOPHILUS INFLUENZAE BETA LACTAMASE NEGATIVE Performed at [facility], [place] [address], [place], [place] [address]    Report Status PENDING   POC SARS Coronavirus 2 Ag-ED - Nasal Swab     Status: Abnormal   Collection Time: 06/16/20  2:02 PM  Result Value Ref Range   SARS Coronavirus 2 Ag POSITIVE (A) NEGATIVE    Comment: (NOTE) SARS-CoV-2 antigen PRESENT.  Positive results indicate the presence of viral antigens, but clinical correlation with patient history and other diagnostic information is necessary to determine patient infection status.  Positive results do not rule out bacterial infection or co-infection  with other viruses. False positive results are rare but can occur, and confirmatory RT-PCR testing may be appropriate in some circumstances. The expected result is Negative.  Fact Sheet for Patients: PodPark.tn Fact Sheet for Providers: GiftContent.is   This test is not yet approved or cleared by the [place] FDA and  has been authorized for detection and/or diagnosis of SARS-CoV-2 by FDA under an Emergency Use Authorization (EUA).  This EUA will remain in effect (meaning this test can be used) for the duration of  the  COVID-19 declaration under Section 564(b)(1) of the Act, 21 U.S.C. section 360bbb-3(b)(1), unless  the authorization is terminated or revoked sooner.    Lactic acid, plasma     Status: None   Collection Time: 06/16/20  2:04 PM  Result Value Ref Range   Lactic Acid, Venous 1.2 0.5 - 1.9 mmol/L    Comment: Performed at [facility] [address] [place], [place] [address]  Protime-INR     Status: None   Collection Time: 06/16/20  2:04 PM  Result Value Ref Range   Prothrombin Time 14.7 11.4 - 15.2 seconds   INR 1.2 0.8 - 1.2    Comment: (NOTE) INR goal varies based on device and disease states. Performed at [facility], [place] [address], [place], [place] [address]   APTT     Status: Abnormal   Collection Time: 06/16/20  2:04 PM  Result Value Ref Range   aPTT 39 (H) 24 - 36 seconds    Comment:        IF BASELINE aPTT IS ELEVATED, SUGGEST PATIENT RISK ASSESSMENT BE USED TO DETERMINE APPROPRIATE ANTICOAGULANT THERAPY. Performed at [facility], [place] [address], [place], [place] [address]   D-dimer, quantitative (not at [facility])     Status: Abnormal   Collection Time: 06/16/20  2:04 PM  Result Value Ref Range  D-Dimer, Quant 3.57 (H) 0.00 - 0.50 ug/mL-FEU    Comment: (NOTE) At the manufacturer cut-off value of 0.5 g/mL FEU, this assay has a negative predictive value of 95-100%.This assay is intended for use in conjunction with a clinical pretest probability (PTP) assessment model to exclude pulmonary embolism (PE) and deep venous thrombosis (DVT) in outpatients suspected of PE or DVT. Results should be correlated with clinical presentation. Performed at [facility], [place] [address] [place], [place] [address]   Fibrinogen     Status: Abnormal   Collection Time: 06/16/20  2:04 PM  Result Value Ref Range   Fibrinogen 571 (H) 210 - 475 mg/dL    Comment: Performed at [facility] [address] [place], [place] [address]  Lactate dehydrogenase      Status: Abnormal   Collection Time: 06/16/20  2:04 PM  Result Value Ref Range   LDH 263 (H) 98 - 192 U/L    Comment: Performed at [facility], [place] [address] [place], [place] [address]  Procalcitonin     Status: None   Collection Time: 06/16/20  2:04 PM  Result Value Ref Range   Procalcitonin 44.75 ng/mL    Comment:        Interpretation: PCT >= 10 ng/mL: Important systemic inflammatory response, almost exclusively due to severe bacterial sepsis or septic shock. (NOTE)       Sepsis PCT Algorithm           Lower Respiratory Tract                                      Infection PCT Algorithm    ----------------------------     ----------------------------         PCT < 0.25 ng/mL                PCT < 0.10 ng/mL          Strongly encourage             Strongly discourage   discontinuation of antibiotics    initiation of antibiotics    ----------------------------     -----------------------------       PCT 0.25 - 0.50 ng/mL            PCT 0.10 - 0.25 ng/mL               OR       >80% decrease in PCT            Discourage initiation of                                            antibiotics      Encourage discontinuation           of antibiotics    ----------------------------     -----------------------------         PCT >= 0.50 ng/mL              PCT 0.26 - 0.50 ng/mL                AND       <80% decrease in PCT             Encourage initiation of  antibiotics       Encourage continuation           of antibiotics    ----------------------------     -----------------------------        PCT >= 0.50 ng/mL                  PCT > 0.50 ng/mL               AND         increase in PCT                  Strongly encourage                                      initiation of antibiotics    Strongly encourage escalation           of antibiotics                                     -----------------------------                                            PCT <= 0.25 ng/mL                                                 OR                                        > 80% decrease in PCT                                      Discontinue / Do not initiate                                             antibiotics  Performed at [facility], [address], [place], [place] [address]   Triglycerides     Status: None   Collection Time: 06/16/20  2:04 PM  Result Value Ref Range   Triglycerides 91 <150 mg/dL    Comment: Performed at [facility] [address] [place], [place] [address]  Troponin I (High Sensitivity)     Status: Abnormal   Collection Time: 06/16/20  2:04 PM  Result Value Ref Range   Troponin I (High Sensitivity) 182 (HH) <18 ng/L    Comment: CRITICAL VALUE NOTED.  VALUE IS CONSISTENT WITH PREVIOUSLY REPORTED AND CALLED VALUE. (NOTE) Elevated high sensitivity troponin I (hsTnI) values and significant  changes across serial measurements may suggest ACS but many other  chronic and acute conditions are known to elevate hsTnI results.  Refer to the Links section for chest pain algorithms and additional  guidance. Performed at [facility], [place] [address], [place], [place] [address]  Creatinine, serum     Status: Abnormal   Collection Time: 06/16/20  3:47 PM  Result Value Ref Range   Creatinine, Ser 15.82 (H) 0.61 - 1.24 mg/dL   GFR, Estimated 3 (L) >60 mL/min    Comment: (NOTE) Calculated using the CKD-EPI Creatinine Equation (2021) Performed at [facility] [place] [address] [place], [place] [address]   CBC     Status: Abnormal   Collection Time: 06/16/20  3:47 PM  Result Value Ref Range   WBC 12.0 (H) 4.0 - 10.5 K/uL   RBC 2.54 (L) 4.22 - 5.81 MIL/uL   Hemoglobin 8.0 (L) 13.0 - 17.0 g/dL   HCT 25.2 (L) 39.0 - 52.0 %   MCV 99.2 80.0 - 100.0 fL   MCH 31.5 26.0 - 34.0 pg   MCHC 31.7 30.0 - 36.0 g/dL   RDW 16.4 (H) 11.5 - 15.5 %   Platelets 146 (L) 150 - 400 K/uL   nRBC 0.0 0.0 -  0.2 %    Comment: Performed at [facility] [address], [place], [place] [address]  Troponin I (High Sensitivity)     Status: Abnormal   Collection Time: 06/16/20  3:47 PM  Result Value Ref Range   Troponin I (High Sensitivity) 181 (HH) <18 ng/L    Comment: CRITICAL VALUE NOTED.  VALUE IS CONSISTENT WITH PREVIOUSLY REPORTED AND CALLED VALUE. (NOTE) Elevated high sensitivity troponin I (hsTnI) values and significant  changes across serial measurements may suggest ACS but many other  chronic and acute conditions are known to elevate hsTnI results.  Refer to the Links section for chest pain algorithms and additional  guidance. Performed at [facility], [place] [address] [place], [place] [address]   Lactic acid, plasma     Status: None   Collection Time: 06/16/20  4:47 PM  Result Value Ref Range   Lactic Acid, Venous 1.1 0.5 - 1.9 mmol/L    Comment: Performed at [facility] [address], [place], [place] [address]  Ferritin     Status: None   Collection Time: 06/16/20  4:47 PM  Result Value Ref Range   Ferritin 218 24 - 336 ng/mL    Comment: Performed at [facility] [address], [place], [place] [address]  C-reactive protein     Status: Abnormal   Collection Time: 06/16/20  4:47 PM  Result Value Ref Range   CRP 14.3 (H) <1.0 mg/dL    Comment: Performed at [facility], [place] [address] [place], [place] [address]  Troponin I (High Sensitivity)     Status: Abnormal   Collection Time: 06/16/20  6:11 PM  Result Value Ref Range   Troponin I (High Sensitivity) 195 (HH) <18 ng/L    Comment: CRITICAL VALUE NOTED.  VALUE IS CONSISTENT WITH PREVIOUSLY REPORTED AND CALLED VALUE. (NOTE) Elevated high sensitivity troponin I (hsTnI) values and significant  changes across serial measurements may suggest ACS but many other  chronic and acute conditions are known to elevate hsTnI results.  Refer to the Links section for chest pain algorithms and additional   guidance. Performed at [facility], [place] [address], [place], [place] [address]   Urinalysis, Routine w reflex microscopic     Status: Abnormal   Collection Time: 06/16/20  6:30 PM  Result Value Ref Range   Color, Urine YELLOW YELLOW   APPearance HAZY (A) CLEAR   Specific Gravity, Urine 1.020 1.005 -  1.030   pH 5.0 5.0 - 8.0   Glucose, UA 150 (A) NEGATIVE mg/dL   Hgb urine dipstick MODERATE (A) NEGATIVE   Bilirubin Urine NEGATIVE NEGATIVE   Ketones, ur NEGATIVE NEGATIVE mg/dL   Protein, ur >=300 (A) NEGATIVE mg/dL   Nitrite NEGATIVE NEGATIVE   Leukocytes,Ua NEGATIVE NEGATIVE   RBC / HPF 0-5 0 - 5 RBC/hpf   WBC, UA 0-5 0 - 5 WBC/hpf   Bacteria, UA RARE (A) NONE SEEN   Squamous Epithelial / LPF 0-5 0 - 5    Comment: Performed at [facility], [place] [address], [place], [place] [address]  Prepare RBC (crossmatch)     Status: None   Collection Time: 06/16/20  9:24 PM  Result Value Ref Range   Order Confirmation      ORDER PROCESSED BY BLOOD BANK Performed at [facility], [place] [address] [place], [place] [address]   CBC with Differential/Platelet     Status: Abnormal   Collection Time: 06/17/20 10:46 AM  Result Value Ref Range   WBC 19.5 (H) 4.0 - 10.5 K/uL   RBC 2.76 (L) 4.22 - 5.81 MIL/uL   Hemoglobin 8.4 (L) 13.0 - 17.0 g/dL   HCT 28.2 (L) 39.0 - 52.0 %   MCV 102.2 (H) 80.0 - 100.0 fL   MCH 30.4 26.0 - 34.0 pg   MCHC 29.8 (L) 30.0 - 36.0 g/dL   RDW 16.6 (H) 11.5 - 15.5 %   Platelets 164 150 - 400 K/uL   nRBC 0.2 0.0 - 0.2 %   Neutrophils Relative % 91 %   Neutro Abs 17.9 (H) 1.7 - 7.7 K/uL   Lymphocytes Relative 4 %   Lymphs Abs 0.7 0.7 - 4.0 K/uL   Monocytes Relative 3 %   Monocytes Absolute 0.5 0.1 - 1.0 K/uL   Eosinophils Relative 0 %   Eosinophils Absolute 0.0 0.0 - 0.5 K/uL   Basophils Relative 0 %   Basophils Absolute 0.0 0.0 - 0.1 K/uL   Immature Granulocytes 2 %   Abs Immature Granulocytes 0.40 (H) 0.00 - 0.07 K/uL    Comment: Performed  at [facility], [place] [address] [place], [place] [address]  Comprehensive metabolic panel     Status: Abnormal   Collection Time: 06/17/20 10:46 AM  Result Value Ref Range   Sodium 136 135 - 145 mmol/L   Potassium 4.9 3.5 - 5.1 mmol/L   Chloride 105 98 - 111 mmol/L   CO2 <7 (L) 22 - 32 mmol/L   Glucose, Bld 126 (H) 70 - 99 mg/dL    Comment: Glucose reference range applies only to samples taken after fasting for at least 8 hours.   BUN 113 (H) 8 - 23 mg/dL   Creatinine, Ser 16.72 (H) 0.61 - 1.24 mg/dL   Calcium 7.4 (L) 8.9 - 10.3 mg/dL   Total Protein 4.8 (L) 6.5 - 8.1 g/dL   Albumin 2.0 (L) 3.5 - 5.0 g/dL   AST 23 15 - 41 U/L   ALT 14 0 - 44 U/L   Alkaline Phosphatase 36 (L) 38 - 126 U/L   Total Bilirubin 0.6 0.3 - 1.2 mg/dL   GFR, Estimated 3 (L) >60 mL/min    Comment: (NOTE) Calculated using the CKD-EPI Creatinine Equation (2021)    Anion gap NOT CALCULATED 5 - 15    Comment: Performed at [facility], [place] [address] [place],  [address]  D-dimer, quantitative (not at [facility])  Status: Abnormal   Collection Time: 06/17/20 10:46 AM  Result Value Ref Range   D-Dimer, Quant 3.91 (H) 0.00 - 0.50 ug/mL-FEU    Comment: (NOTE) At the manufacturer cut-off value of 0.5 g/mL FEU, this assay has a negative predictive value of 95-100%.This assay is intended for use in conjunction with a clinical pretest probability (PTP) assessment model to exclude pulmonary embolism (PE) and deep venous thrombosis (DVT) in outpatients suspected of PE or DVT. Results should be correlated with clinical presentation. Performed at [facility], [place] [address], [place], [place] [address]   C-reactive protein     Status: Abnormal   Collection Time: 06/17/20 10:46 AM  Result Value Ref Range   CRP 25.0 (H) <1.0 mg/dL    Comment: Performed at [facility] [address] [place], [place] [address]  Phosphorus     Status: Abnormal   Collection Time: 06/17/20 10:46 AM   Result Value Ref Range   Phosphorus >30.0 (H) 2.5 - 4.6 mg/dL    Comment: RESULTS CONFIRMED BY MANUAL DILUTION Performed at [facility], [place] [address], [place], [place] [address]   Troponin I (High Sensitivity)     Status: Abnormal   Collection Time: 06/17/20 10:46 AM  Result Value Ref Range   Troponin I (High Sensitivity) 163 (HH) <18 ng/L    Comment: CRITICAL VALUE NOTED.  VALUE IS CONSISTENT WITH PREVIOUSLY REPORTED AND CALLED VALUE. (NOTE) Elevated high sensitivity troponin I (hsTnI) values and significant  changes across serial measurements may suggest ACS but many other  chronic and acute conditions are known to elevate hsTnI results.  Refer to the Links section for chest pain algorithms and additional  guidance. Performed at [facility], [place] [address] [place] [facility] [place], [place] [address]   Lipid panel     Status: None   Collection Time: 06/17/20 10:48 AM  Result Value Ref Range   Cholesterol 134 0 - 200 mg/dL   Triglycerides 128 <150 mg/dL   HDL 58 >40 mg/dL   Total CHOL/HDL Ratio 2.3 RATIO   VLDL 26 0 - 40 mg/dL   LDL Cholesterol 50 0 - 99 mg/dL    Comment:        Total Cholesterol/HDL:CHD Risk Coronary Heart Disease Risk Table                     Men   Women  1/2 Average Risk   3.4   3.3  Average Risk       5.0   4.4  2 X Average Risk   9.6   7.1  3 X Average Risk  23.4   11.0        Use the calculated Patient Ratio above and the CHD Risk Table to determine the patient's CHD Risk.        ATP III CLASSIFICATION (LDL):  <100     mg/dL   Optimal  100-129  mg/dL   Near or Above                    Optimal  130-159  mg/dL   Borderline  160-189  mg/dL   High  >190     mg/dL   Very High Performed at [facility] [address], [place], [place] [address]   CBC with Differential/Platelet     Status: Abnormal   Collection Time: 06/18/20  3:43 AM  Result Value Ref Range   WBC 15.8 (  H) 4.0 - 10.5 K/uL   RBC 2.50 (L) 4.22 - 5.81 MIL/uL   Hemoglobin  7.8 (L) 13.0 - 17.0 g/dL   HCT 23.9 (L) 39.0 - 52.0 %   MCV 95.6 80.0 - 100.0 fL   MCH 31.2 26.0 - 34.0 pg   MCHC 32.6 30.0 - 36.0 g/dL   RDW 16.3 (H) 11.5 - 15.5 %   Platelets 165 150 - 400 K/uL   nRBC 0.6 (H) 0.0 - 0.2 %   Neutrophils Relative % 99 %   Neutro Abs 15.6 (H) 1.7 - 7.7 K/uL   Lymphocytes Relative 1 %   Lymphs Abs 0.2 (L) 0.7 - 4.0 K/uL   Monocytes Relative 0 %   Monocytes Absolute 0.0 (L) 0.1 - 1.0 K/uL   Eosinophils Relative 0 %   Eosinophils Absolute 0.0 0.0 - 0.5 K/uL   Basophils Relative 0 %   Basophils Absolute 0.0 0.0 - 0.1 K/uL   WBC Morphology See Note     Comment: Mild Left Shift. 1 to 5% Metas and Myelos, Occ Pro Noted.   nRBC 0 0 /100 WBC   Abs Immature Granulocytes 0.00 0.00 - 0.07 K/uL   Acanthocytes MARKED    Burr Cells PRESENT    Polychromasia PRESENT     Comment: Performed at [facility], [place] [address] [place], [place] [address]  Comprehensive metabolic panel     Status: Abnormal   Collection Time: 06/18/20  3:43 AM  Result Value Ref Range   Sodium 136 135 - 145 mmol/L   Potassium 5.8 (H) 3.5 - 5.1 mmol/L   Chloride 105 98 - 111 mmol/L   CO2 7 (L) 22 - 32 mmol/L   Glucose, Bld 107 (H) 70 - 99 mg/dL    Comment: Glucose reference range applies only to samples taken after fasting for at least 8 hours.   BUN 131 (H) 8 - 23 mg/dL   Creatinine, Ser 17.14 (H) 0.61 - 1.24 mg/dL   Calcium 6.9 (L) 8.9 - 10.3 mg/dL   Total Protein 4.7 (L) 6.5 - 8.1 g/dL   Albumin 1.8 (L) 3.5 - 5.0 g/dL   AST 27 15 - 41 U/L   ALT 18 0 - 44 U/L   Alkaline Phosphatase 46 38 - 126 U/L   Total Bilirubin 0.4 0.3 - 1.2 mg/dL   GFR, Estimated 3 (L) >60 mL/min    Comment: (NOTE) Calculated using the CKD-EPI Creatinine Equation (2021)    Anion gap 24 (H) 5 - 15    Comment: Performed at [facility], [place] [address] [place], [place] [address]  C-reactive protein     Status: Abnormal   Collection Time: 06/18/20  3:43 AM  Result Value Ref Range   CRP 25.2 (H)  <1.0 mg/dL    Comment: Performed at [facility] [address] [place], [place] [address]  Troponin I (High Sensitivity)     Status: Abnormal   Collection Time: 06/18/20  8:47 AM  Result Value Ref Range   Troponin I (High Sensitivity) 102 (HH) <18 ng/L    Comment: CRITICAL VALUE NOTED.  VALUE IS CONSISTENT WITH PREVIOUSLY REPORTED AND CALLED VALUE. (NOTE) Elevated high sensitivity troponin I (hsTnI) values and significant  changes across serial measurements may suggest ACS but many other  chronic and acute conditions are known to elevate hsTnI results.  Refer to the Links section for chest pain algorithms and additional  guidance. Performed at [facility], [address] [place], [place] [address]   Prepare RBC (crossmatch)     Status: None   Collection Time: 06/18/20 10:43 AM  Result Value Ref Range   Order Confirmation      ORDER PROCESSED BY BLOOD BANK BB SAMPLE OR UNITS ALREADY AVAILABLE Performed at [facility], [place] [address], [place], [place] [address]    DG Chest Portable 1 View  Result Date: 06/16/2020 CLINICAL DATA:  Shortness of breath for 4 days. EXAM: PORTABLE CHEST 1 VIEW COMPARISON:  Chest x-ray dated 07/17/2019. FINDINGS: New ill-defined airspace opacities within the RIGHT mid and lower lung zones. Additional subtle opacity at the LEFT lung base. No pleural effusion or pneumothorax is seen. Stable mild cardiomegaly. IMPRESSION: 1. New multifocal airspace opacities bilaterally, RIGHT greater than LEFT, suspicious for multifocal pneumonia, alternatively asymmetric pulmonary edema pattern. 2. Stable mild cardiomegaly. Electronically Signed   By: Franki Cabot M.D.   On: 06/16/2020 12:54   CT Angio Chest/Abd/Pel for Dissection W and/or W/WO  Result Date: 06/18/2020 CLINICAL DATA:  65 year old male with sudden onset severe pain radiating to the back. Generalized weakness. Recent shortness of breath for 5 or 6 days. EXAM: CT ANGIOGRAPHY CHEST, ABDOMEN  AND PELVIS TECHNIQUE: Precontrast CT chest followed by Multidetector CT imaging through the chest, abdomen and pelvis was performed using the standard protocol during bolus administration of intravenous contrast. Multiplanar reconstructed images and MIPs were obtained and reviewed to evaluate the vascular anatomy. CONTRAST:  171mL OMNIPAQUE IOHEXOL 350 MG/ML SOLN COMPARISON:  Portable chest x-ray 06/16/2020 and earlier. No prior chest, abdomen and Pelvis CT. CTA head and neck 12/24/2016 FINDINGS: CTA CHEST FINDINGS Cardiovascular: Calcified coronary artery and thoracic aortic atherosclerosis. Negative for thoracic aortic dissection or aneurysm. Proximal right vertebral artery severe stenosis versus occlusion (series 6, image 7), progressed since the 2018 CTA where severe stenosis was demonstrated. Other proximal great vessels appear to remain patent. Borderline to mild cardiomegaly.  No pericardial effusion. Central pulmonary artery and major bilateral pulmonary artery branches also appear to remain patent. Mediastinum/Nodes: Negative. Lungs/Pleura: Major airways are patent. Widely scattered bilateral peribronchial and peripheral pulmonary ground-glass opacity, trending toward consolidation in both lower lobe posterior basal segments, the medial segment of the right middle lobe. Consolidation also in the anterior basal segment of the left lower lobe. Left upper lobe and right lung apex relatively spared. No pleural effusion. Musculoskeletal: No acute osseous abnormality identified. Review of the MIP images confirms the above findings. CTA ABDOMEN AND PELVIS FINDINGS VASCULAR Extensive Aortoiliac calcified atherosclerosis. Negative for abdominal aortic aneurysm or dissection. Bulky soft plaque in the proximal SMA (series 6, image 110) which appears moderately to severely stenotic but remains patent. Other major aortic branches appear patent. Bilateral main renal artery stenosis also suspected. Bilateral proximal  internal iliac arteries appear occluded due to plaque but are probably reconstituted distally. Bilateral external iliac arteries are patent but with severe bilateral stenosis (series 6, image 174 on the right). The right superficial femoral artery is occluded from its origin (series 6, image 209). The left SFA is patent but severely stenotic (image 217). Proximal deep femoral arteries appear patent. Review of the MIP images confirms the above findings. NON-VASCULAR Hepatobiliary: Contracted gallbladder. Liver enhancement within normal limits. No bile duct enlargement. Pancreas: Partially degraded by motion artifact, no pancreatic mass or inflammation is evident. Spleen: Negative. Adrenals/Urinary Tract: Normal adrenal glands. Bilateral renal vascular calcifications. No hydronephrosis. Renal enhancement appears symmetric. Ureters and urinary bladder are decompressed. Pelvic phleboliths greater on the  left. Stomach/Bowel: Decompressed sigmoid colon and rectum. Extensive sigmoid diverticulosis, although no convincing active inflammation. Mild to moderate descending diverticulosis with no inflammation identified. Mild motion artifact through the transverse colon which appears negative. Decompressed right colon. Normal gas containing appendix. The cecum is decompressed. Negative terminal ileum. No dilated small bowel. Motion artifact in the upper abdomen. Stomach and duodenum are grossly normal. No pneumoperitoneum. No free fluid in the abdomen. Lymphatic: No lymphadenopathy. Reproductive: Negative. Other: Nonspecific small volume pelvic free fluid with simple fluid density (series 6, image 190). Musculoskeletal: No acute osseous abnormality identified. Review of the MIP images confirms the above findings. IMPRESSION: 1. Negative for aortic dissection or aneurysm. Aortic Atherosclerosis (ICD10-I70.0). 2. Positive for bilateral pulmonary opacity most compatible with acute viral/atypical respiratory infection, suspicious  for COVID-19. No pleural effusion. 3. Positive also for multiple atherosclerotic occlusions and high-grade stenoses: - occluded right superficial femoral artery. - occluded bilateral internal iliac arteries. - severe stenosis bilateral external iliac arteries, left SFA. - progressed severe stenosis versus occlusion of the proximal right vertebral artery since a 2018 Neck CTA. - bulky soft plaque with moderate to severe stenosis in the proximal SMA. 4. Small volume of pelvic free fluid is abnormal but nonspecific, with no superimposed inflammatory process identified in the abdomen or pelvis. 5. Diverticulosis of the descending and sigmoid colon. Electronically Signed   By: Genevie Ann M.D.   On: 06/18/2020 08:51      Assessment/Plan COVID-19 pneumonia Severe respiratory distress  - Lasix 120 mg ordered Severe diffuse atherosclerotic peripheral vascular disease End-stage renal disease  - AV fistula creation 03/28/20 - not developed left arm  - Creatinine 17.14/ discussing dialysis, but does not have access at this time COPD/Hx tobacco use Hx PFO with atrial septal aneurysm  - ECHO 07/18/19 EF 60-65%, LVH, grade II diastolic dysfunction, normal RV, RVSP 37.5 mm/Hg, mild RV regurgitation, LA dilatation, AV normal, Hx [name] Hypertension Anemia  - H/H 8.4/28.2 >> 7.8/23.9(1/11)  Abdominal pain, uncertain etiology with diffuse vascular disease  FEN:  NPO HB:[id number] 1/11>> day 1 DVT: add SCD's Follow up:  TBD  Plan:  CT shows the SMA is open, his exam is variable, but no indication of ischemia on the CT.  He is having a very hard time breathing and sounds terrible right now.  RR up in the 30's while I'm in the room.   He has been on a gurney in the ED since 06/17/19. I will review with Dr. Zenia Resides, I don't have a good explanation for his abdominal pain, but I don't think he has acute ischemia at this point.  The pain is just like what he complained of when he came in.  Whether it is worse now than on  admission is difficult to tell.  He is getting lasix right now, he needs dialysis also.  We will follow with you.       Earnstine Regal [facility] 06/18/2020, 11:16 AM Please see Amion for pager number during day hours 7:00am-4:30pm

## 2020-06-18 NOTE — Consult Note (Addendum)
Hospital Consult  VASCULAR SURGERY ASSESSMENT & PLAN:   MULTILEVEL ARTERIAL OCCLUSIVE DISEASE: This patient has multilevel arterial occlusive disease but no acute vascular symptoms.  His primary issues appear to be acute hypoxic respiratory failure secondary to COVID-pneumonia in addition to a GI bleed.  In addition he has had some back pain and LE paresthesias and may have some underlying back issues.  However, his aortoiliac disease and infrainguinal arterial occlusive disease are chronic and he is not having acute symptoms related to this.  If he shows significant clinical improvement then we can reevaluate and determine if he would benefit from arteriography during this admission.  However for now we will follow peripherally given his multiple other medical issues which are more pressing.  [name], MD Office: [phone]   Reason for Consult: PAD findings on CTA Requesting Physician:  Family Medicine MRN #:  [id number]  History of Present Illness: This is a 65 y.o. male with past medical history significant for hypertension, hyperlipidemia, COPD, and CKD now ESRD.  He presented to the emergency department with shortness of breath and was found to be COVID-positive.  This morning he developed severe abdominal pain radiating to his back, thus a CTA chest abdomen pelvis was ordered.  Findings included bilateral internal iliac artery occlusions, severe stenosis of bilateral external iliac arteries, and likely bilateral SFA occlusions.  Patient is complaining of symmetrical bilateral lower extremity numbness this morning.  No hemodynamically significant stenosis was found in abdominal aorta.  He denies any rest pain or tissue changes in his bilateral lower extremities.  Surgical history also significant for left brachiocephalic fistula creation by Dr. Carlis Abbott in October.  Nephrology's plan was to try to access left arm AV fistula for dialysis today.  Last office visit 1 month ago demonstrated  a patent but slightly mature left brachiocephalic fistula.  Past Medical History:  Diagnosis Date  . Asthma   . Carotid stenosis 09/12/2019  . Claudication in peripheral vascular disease ([place]) 09/12/2019  . Complication of anesthesia    " i HAVE A HARD TIME WAKING UP "  . COPD (chronic obstructive pulmonary disease) ([place])   . GERD (gastroesophageal reflux disease)   . HLD (hyperlipidemia)   . Hypertension   . PFO with atrial septal aneurysm    by echo 11/2017  . Stroke ([place])   . [name] (transient ischemic attack) 11/2017  . Tobacco abuse    quit 07/17/19    Past Surgical History:  Procedure Laterality Date  . AV FISTULA PLACEMENT Left 03/28/2020   Procedure: ARTERIOVENOUS (AV) FISTULA CREATION LEFT;  Surgeon: [name], MD;  Location: [facility];  Service: Vascular;  Laterality: Left;  . COLONOSCOPY    . FINGER SURGERY      No Known Allergies  Prior to Admission medications   Medication Sig Start Date End Date Taking? Authorizing Provider  acetaminophen (TYLENOL) 650 MG CR tablet Take 1,300 mg by mouth every 8 (eight) hours as needed for pain or fever.   Yes [provider]  albuterol (VENTOLIN HFA) 108 (90 Base) MCG/ACT inhaler Inhale 1-2 puffs into the lungs every 6 (six) hours as needed for wheezing or shortness of breath.   Yes [provider]  amLODipine (NORVASC) 10 MG tablet Take 1 tablet (10 mg total) by mouth daily. 08/30/19  Yes Kerin Perna, NP  furosemide (LASIX) 20 MG tablet Take 20 mg by mouth daily as needed for fluid.    Yes [provider]  metoprolol tartrate (LOPRESSOR)  25 MG tablet Take 25 mg by mouth 2 (two) times daily. 04/23/20  Yes [provider]  nitroGLYCERIN (NITROSTAT) 0.4 MG SL tablet Place 1 tablet (0.4 mg total) under the tongue every 5 (five) minutes x 3 doses as needed for chest pain. Patient taking differently: Place 0.4 mg under the tongue every 5 (five) minutes as needed for chest pain. 07/23/19  Yes  Aline August, MD  sodium bicarbonate 650 MG tablet Take 650 mg by mouth daily as needed for heartburn. 02/29/20  Yes [provider]    Social History   Socioeconomic History  . Marital status: Divorced    Spouse name: Not on file  . Number of children: Not on file  . Years of education: Not on file  . Highest education level: Not on file  Occupational History  . Not on file  Tobacco Use  . Smoking status: Former Smoker    Packs/day: 1.00    Years: 50.00    Pack years: 50.00    Types: Cigarettes    Quit date: 07/17/2019    Years since quitting: 0.9  . Smokeless tobacco: Never Used  Vaping Use  . Vaping Use: Never used  Substance and Sexual Activity  . Alcohol use: Yes    Alcohol/week: 1.0 - 3.0 standard drink    Types: 1 - 3 Cans of beer per week  . Drug use: No  . Sexual activity: Not Currently  Other Topics Concern  . Not on file  Social History Narrative   Married   Works in Audiological scientist   Social Determinants of Health   Financial Resource Strain: Low Risk   . Difficulty of Paying Living Expenses: Not hard at all  Food Insecurity: No Food Insecurity  . Worried About Charity fundraiser in the Last Year: Never true  . Ran Out of Food in the Last Year: Never true  Transportation Needs: No Transportation Needs  . Lack of Transportation (Medical): No  . Lack of Transportation (Non-Medical): No  Physical Activity: Not on file  Stress: No Stress Concern Present  . Feeling of Stress : Not at all  Social Connections: Not on file  Intimate Partner Violence: Not on file     Family History  Problem Relation Age of Onset  . Hypertension Mother   . Colon cancer Father   . Stomach cancer Brother     ROS: Otherwise negative unless mentioned in HPI  Physical Examination  Vitals:   06/18/20 1028 06/18/20 1030  BP:    Pulse:  64  Resp:  (!) 32  Temp: 98.9 F (37.2 C)   SpO2:  99%   Body mass index is 20.53 kg/m.  General:  Toxic  appearing male with increase work for breathing Gait: Not observed HENT: WNL, normocephalic Pulmonary: Wet lung sounds; inspiratory and expiratory wheezing Cardiac: regular Abdomen: soft, tender all quadrants Skin: without rashes Vascular Exam/Pulses: Right popliteal, peroneal, and venous leg PT by Doppler; left brisk ATA and PTA by Doppler Extremities: Feet are symmetrically warm; darkening/mottling of knees and distal thighs Musculoskeletal: no muscle wasting or atrophy  Neurologic: A&O X 3;  No focal weakness or paresthesias are detected; speech is fluent/normal Psychiatric:  The pt has Normal affect. Lymph:  Unremarkable  CBC    Component Value Date/Time   WBC 15.8 (H) 06/18/2020 0343   RBC 2.50 (L) 06/18/2020 0343   HGB 7.8 (L) 06/18/2020 0343   HGB 14.5 12/07/2017 1417   HCT 23.9 (  L) 06/18/2020 0343   HCT 42.4 12/07/2017 1417   PLT 165 06/18/2020 0343   PLT 338 12/07/2017 1417   MCV 95.6 06/18/2020 0343   MCV 92 12/07/2017 1417   MCH 31.2 06/18/2020 0343   MCHC 32.6 06/18/2020 0343   RDW 16.3 (H) 06/18/2020 0343   RDW 13.2 12/07/2017 1417   LYMPHSABS 0.2 (L) 06/18/2020 0343   LYMPHSABS 3.0 12/07/2017 1417   MONOABS 0.0 (L) 06/18/2020 0343   EOSABS 0.0 06/18/2020 0343   EOSABS 0.4 12/07/2017 1417   BASOSABS 0.0 06/18/2020 0343   BASOSABS 0.1 12/07/2017 1417    BMET    Component Value Date/Time   NA 136 06/18/2020 0343   NA 140 12/07/2017 1417   K 5.8 (H) 06/18/2020 0343   CL 105 06/18/2020 0343   CO2 7 (L) 06/18/2020 0343   GLUCOSE 107 (H) 06/18/2020 0343   BUN 131 (H) 06/18/2020 0343   BUN 26 12/07/2017 1417   CREATININE 17.14 (H) 06/18/2020 0343   CREATININE 1.15 02/19/2017 1049   CALCIUM 6.9 (L) 06/18/2020 0343   CALCIUM 8.5 (L) 06/10/2020 0900   GFRNONAA 3 (L) 06/18/2020 0343   GFRAA 6 (L) 03/04/2020 1045    COAGS: Lab Results  Component Value Date   INR 1.2 06/16/2020   INR 0.88 11/09/2017   INR 0.84 12/24/2016     Non-Invasive Vascular  Imaging:   CTA demonstrates patent abdominal aorta Bilateral internal iliac artery occlusion Bilateral external iliac artery stenosis Occluded right SFA; patent profunda Severely stenotic left proximal SFA   ASSESSMENT/PLAN: This is a 65 y.o. male with CTA demonstrating iliofemoral occlusive disease; CKD who will begin dialysis during current admission; COVID-positive  -Patient is ill appearing with labored breathing; plans noted to initiate HD; based on physical exam, brachiocephalic fistula is patent however feels small in caliber; check fistula duplex; may need temporary line versus TDC, defer to Nephrology -Chronic findings noted on CTA of iliofemoral occlusive disease; based on Doppler exam bilateral lower extremities do not appear to be acutely ischemic; would defer further workup until patient improves clinically -On-call vascular surgeon will evaluate the patient later today and provide further treatment plans   Dagoberto Ligas PA-C [facility] [phone]

## 2020-06-18 NOTE — Progress Notes (Signed)
Attempted to get Covd pt from ED. Unable to reach RN. Moved onto next pt d/t having multiple covid pts to run tonight

## 2020-06-18 NOTE — ED Notes (Signed)
Pt with abd pain radiating down bil legs. Pt also with L arm and R leg weakness. Sudden onset of symptoms at around 0630 today.

## 2020-06-18 NOTE — Progress Notes (Signed)
Went to evaluate patient after receiving notification that he was having significant abdominal pain.  Found patient uncomfortable moving around in bed complaining of severe abdominal pain which he said started earlier this morning.  Patient also complained of shortness of breath and chest pain which he says is worse since last night.  Patient states his abdominal pain is radiating to his back.  O: BP (!) 148/83   Pulse 66   Temp 97.7 F (36.5 C) (Oral)   Resp (!) 31   Ht 5\' 8"  (1.727 m)   Wt 61.2 kg   SpO2 98%   BMI 20.53 kg/m  General: Elderly male, writhing in bed complaining of abdominal pain Heart: Tachycardic Lungs: Increased work of breathing, on room air Abdomen: Significant abdominal pain to palpation diffusely Skin: Warm and dry  A/P: I have concern for aortic dissection with patient's complaints of significant abdominal pain which is radiating to his back.  His vitals are currently stable. He is planning to start HD today, however due to the significant amount of pain he is experiencing I believe he does need stat imaging with a CTA chest abdomen pelvis, with/without contrast.  I discussed this with our team including our attending who agrees.  We will also order stat troponin.  Will provide fentanyl for discomfort.  We will also reach out to nephrology to let them know as they were planning to initiate HD on the patient today.  We will keep a close monitor on the patient.

## 2020-06-18 NOTE — ED Notes (Signed)
Pt placed on 3L [place]

## 2020-06-18 NOTE — Progress Notes (Signed)
Family Medicine Teaching Service Daily Progress Note Intern Pager: [phone]  Patient name: Albert Harris Medical record number: [id number] Date of birth: [birth year] Age: 65 y.o. Gender: male  Primary Care Provider: Kerin Perna, NP Consultants: Vascular, Nephro Code Status: Full  Pt Overview and Major Events to Date:  Admitted 1/10  Assessment and Plan: Albert Harris a 65 y.o.malepresenting with difficulty breathing. PMH is significant forCOPD, CKD Stage V, HFpEF, PAD, [name], HTN, HLD, and Carotid Stenosis.  Acute Hypoxic Respiratory Failure 2/2COVID-19 pneumonia, meeting sepsis criteria Patient satting 100% on RA.  Does have increased work of breathing and feels breathing has worsened.  Also has chest pain he feels is due to breathing.  No chest tenderness to palpation.  Trop 431>540>086.  Touched base with Cards yesterday Trops who indicated they would not need to see patient unless Trops became significantly more elevated.   - Maintain O2 sats > 90% - Continuous Pulse Ox - Vital signs q4hr - AM CMP, CBC with Diff, D-Dimer, CRP - Albuterol q4hr PRN - Continue Remdesevir day 3/5 - Continue Decadron 6 mg daily day 3/10 - Strict I's/O's - incentive spirometer   Anemia/GI Bleed/Abdominal Pain Patient had another episode of BRBPR last night and Hgb dropped to 7.8.  Had severe abdominal pain following BM.  Has history of PAD.  Also found to have weaknes in left arm and right leg and back pain.  Abdomen very tender to palpation and movement on on RLQ.  Given picture concern for ischemic bowel/mesenteric ischemia.  Obtained Abdominal CTA which identified stenosis of multiple vessels.  Vitals currently stable - Will contact Vascular and Surgery given acute severe pain and stenosis of blood vessels on CT and lack of other identifiable cause for pain - Transfuse 1 unit pRBC's - Post-transfusion H/H - Fentanyl 25 mcg q2hrs  - AM CBC - Comntinue to closely monitor bowel  movments  H Influenzae Bacteremia Blood culture returned positive for today.  Patient currently afebrile.  BP remains slightly elevated and not tachycardic. - Start renally dosed 2g Ampicillin BID     CKDV Nephro following. Spoke with again given requirement CTA, increasing K 4.9>5.8, and pRBC transfusion.  Aware of current condition.  Recommended  giving IV Lasix 120 mg one dose with plan to dialyze today. - Nephro following, appreciate recs - Avoid renal toxic medications - AM CMP - fluid restriction - strict I/O  HTN Poorly controlled in past. SBP has been 130-160 over last 24 hours. Takes Amlodipine 10 mg and Metoprolol 25 mg BID at home. Was previously prescribed Imdur, Doxazosin, and Hydralazine but not currently taking. Medications held this morning due to respiratory distress. - Continue Amlodipine and Metoprolol - Hold Imdur, Doxazosin, and Hydralazine  HFpEF Echofrom2/21 showed EF of60-65%. Found to have Diastolic Dysfunction Grade II. Currently on Metoprolol 25 mg BID at home. Was previously on Lasix 20 mg.  - Continue Metoprolol 25 mg BID  Severe PAD  carotid stenosis- poor candidate for intervention given kidney function, per patient. Increased concern for elevated troponins as can be used as marker for CAD - PT/OT  HLD Previously on Statin therapy. LDL-50. - Continue atorvastatin 40mg   FEN/GI:  PPx: Holding heparin in setting of droppinh Hgb   Status is: Inpatient  Remains inpatient appropriate because:Inpatient level of care appropriate due to severity of illness   Dispo: The patient is from: Home              Anticipated d/c is to: Home  Anticipated d/c date is: 3 days              Patient currently is not medically stable to d/c.    Subjective:  Patient indicates feels like breathing is owrse and in significant pain in back and right leg.  Objective: Temp:  [94.6 F (34.8 C)-98.9 F (37.2 C)] 94.6 F (34.8 C) (01/11  1214) Pulse Rate:  [59-114] 73 (01/11 1214) Resp:  [16-41] 31 (01/11 1214) BP: (132-161)/(61-108) 151/61 (01/11 1214) SpO2:  [94 %-100 %] 99 % (01/11 1214) Physical Exam:  Physical Exam Constitutional:      General: He is in acute distress.  HENT:     Head: Normocephalic.     Mouth/Throat:     Mouth: Mucous membranes are moist.  Cardiovascular:     Rate and Rhythm: Normal rate and regular rhythm.  Pulmonary:     Effort: Tachypnea present.     Breath sounds: No decreased air movement. Rhonchi present.     Comments: Diffuse coarse breath sounds Abdominal:     Tenderness: There is abdominal tenderness in the right lower quadrant.     Hernia: No hernia is present.     Comments: RLQ severely tender to palpation  Musculoskeletal:        General: Swelling and tenderness present.     Comments: No sign of lower limb hypoperfusion  Skin:    Capillary Refill: Capillary refill takes less than 2 seconds.     Coloration: Skin is not pale.  Neurological:     Mental Status: He is alert and oriented to person, place, and time.     Comments: Weakness throughout right leg likely due to pain, decreased girp strength in left     Laboratory: Recent Labs  Lab 06/16/20 1547 06/17/20 1046 06/18/20 0343  WBC 12.0* 19.5* 15.8*  HGB 8.0* 8.4* 7.8*  HCT 25.2* 28.2* 23.9*  PLT 146* 164 165   Recent Labs  Lab 06/16/20 1223 06/16/20 1547 06/17/20 1046 06/18/20 0343  NA 137  --  136 136  K 4.6  --  4.9 5.8*  CL 108  --  105 105  CO2 10*  --  <7* 7*  BUN 86*  --  113* 131*  CREATININE 15.93* 15.82* 16.72* 17.14*  CALCIUM 7.7*  --  7.4* 6.9*  PROT 5.0*  --  4.8* 4.7*  BILITOT 0.2*  --  0.6 0.4  ALKPHOS 37*  --  36* 46  ALT 11  --  14 18  AST 15  --  23 27  GLUCOSE 102*  --  126* 107*     Imaging/Diagnostic Tests:  EXAM: CT ANGIOGRAPHY CHEST, ABDOMEN AND PELVIS  TECHNIQUE: Precontrast CT chest followed by  Multidetector CT imaging through the chest, abdomen and pelvis  was performed using the standard protocol during bolus administration of intravenous contrast. Multiplanar reconstructed images and MIPs were obtained and reviewed to evaluate the vascular anatomy.  CONTRAST:  162mL OMNIPAQUE IOHEXOL 350 MG/ML SOLN  COMPARISON:  Portable chest x-ray 06/16/2020 and earlier. No prior chest, abdomen and Pelvis CT.  CTA head and neck 12/24/2016  FINDINGS: CTA CHEST FINDINGS  Cardiovascular: Calcified coronary artery and thoracic aortic atherosclerosis. Negative for thoracic aortic dissection or aneurysm.  Proximal right vertebral artery severe stenosis versus occlusion (series 6, image 7), progressed since the 2018 CTA where severe stenosis was demonstrated. Other proximal great vessels appear to remain patent.  Borderline to mild cardiomegaly.  No pericardial effusion.  Central pulmonary artery and  major bilateral pulmonary artery branches also appear to remain patent.  Mediastinum/Nodes: Negative.  Lungs/Pleura: Major airways are patent. Widely scattered bilateral peribronchial and peripheral pulmonary ground-glass opacity, trending toward consolidation in both lower lobe posterior basal segments, the medial segment of the right middle lobe. Consolidation also in the anterior basal segment of the left lower lobe. Left upper lobe and right lung apex relatively spared. No pleural effusion.  Musculoskeletal: No acute osseous abnormality identified.  Review of the MIP images confirms the above findings.  CTA ABDOMEN AND PELVIS FINDINGS  VASCULAR  Extensive Aortoiliac calcified atherosclerosis. Negative for abdominal aortic aneurysm or dissection.  Bulky soft plaque in the proximal SMA (series 6, image 110) which appears moderately to severely stenotic but remains patent. Other major aortic branches appear patent. Bilateral main renal artery stenosis also suspected.  Bilateral proximal internal iliac arteries appear  occluded due to plaque but are probably reconstituted distally. Bilateral external iliac arteries are patent but with severe bilateral stenosis (series 6, image 174 on the right).  The right superficial femoral artery is occluded from its origin (series 6, image 209). The left SFA is patent but severely stenotic (image 217). Proximal deep femoral arteries appear patent.  Review of the MIP images confirms the above findings.  NON-VASCULAR  Hepatobiliary: Contracted gallbladder. Liver enhancement within normal limits. No bile duct enlargement.  Pancreas: Partially degraded by motion artifact, no pancreatic mass or inflammation is evident.  Spleen: Negative.  Adrenals/Urinary Tract: Normal adrenal glands. Bilateral renal vascular calcifications. No hydronephrosis. Renal enhancement appears symmetric. Ureters and urinary bladder are decompressed. Pelvic phleboliths greater on the left.  Stomach/Bowel: Decompressed sigmoid colon and rectum. Extensive sigmoid diverticulosis, although no convincing active inflammation.  Mild to moderate descending diverticulosis with no inflammation identified. Mild motion artifact through the transverse colon which appears negative. Decompressed right colon. Normal gas containing appendix. The cecum is decompressed. Negative terminal ileum.  No dilated small bowel. Motion artifact in the upper abdomen. Stomach and duodenum are grossly normal. No pneumoperitoneum. No free fluid in the abdomen.  Lymphatic: No lymphadenopathy.  Reproductive: Negative.  Other: Nonspecific small volume pelvic free fluid with simple fluid density (series 6, image 190).  Musculoskeletal: No acute osseous abnormality identified.  Review of the MIP images confirms the above findings.  IMPRESSION: 1. Negative for aortic dissection or aneurysm. Aortic Atherosclerosis (ICD10-I70.0).  2. Positive for bilateral pulmonary opacity most compatible  with acute viral/atypical respiratory infection, suspicious for COVID-19. No pleural effusion.  3. Positive also for multiple atherosclerotic occlusions and high-grade stenoses: - occluded right superficial femoral artery. - occluded bilateral internal iliac arteries. - severe stenosis bilateral external iliac arteries, left SFA. - progressed severe stenosis versus occlusion of the proximal right vertebral artery since a 2018 Neck CTA. - bulky soft plaque with moderate to severe stenosis in the proximal SMA.  4. Small volume of pelvic free fluid is abnormal but nonspecific, with no superimposed inflammatory process identified in the abdomen or pelvis.  5. Diverticulosis of the descending and sigmoid colon.  Delora Fuel, MD 06/18/2020, 12:49 PM PGY-1, [place] Intern pager: [phone], text pages welcome

## 2020-06-18 NOTE — ED Notes (Addendum)
MD messaged re: Patient has his PRBC's here, but his respiratory status is terrible. He has audible crackles, and grunting. Resp rate is 35. Please advise.

## 2020-06-18 NOTE — Consult Note (Addendum)
NAME:  Albert Harris, MRN:  [id number], DOB:  [birth year], LOS: 2 ADMISSION DATE:  06/16/2020, CONSULTATION DATE:  06/18/20 REFERRING MD:  FMTS, CHIEF COMPLAINT:  Decompensation -- Hypoxia    Brief History:  65 yo M + COVID, bacteremia, CKD V, HFpEF admitted to [place], who on 1/11 has had increasing O2 req, from RA to 6L (SpO2 94-99% on 6L)  PCCM consulted in setting of these acute changes and evaluation of possible decompensation  History of Present Illness:  65 yo M PMH HFpEF, CKD V, HTN, [name], PVD, COPD admitted to [facility] 1/19 for SOB. Found to be COVID-19 positive. CXR 1/9 with bilateral opacities, possibly pulm edema. In ED, Cr 15 -- plan to start [facility] 1/11. On 1/11 pt with hgb drop from 8.4 to 7.8, for which 1 PRBC was ordered and approx 1/2 unit was completed.  Pt is planned to start iHD 1/11, however this has not yet occurred.On 1/11 pt also with new O2 requirement -- now on 6LNC from RA, with resultant SpO2 94-99%. BCx have resulted with H Flu. Pt also c/o new severe abdominal pain -- CTA c/a/p obtained and reveal bilateral pulm opacities, multiple atherosclerotic occlusion and high grade stenoses, small volume of pelvic free fluid, diverticulosis.   VVS, CCS, and PCCM consulted in this setting.    Past Medical History:  PVD HFpEF CKDV HTN COPD Tobacco use disorder  GERD  Significant Hospital Events:  1/9 admitted to [facility], covid +. Presenting Cr 15.  1/11 BCx H.Flu. Hgb 7.8, 1 PRBC ordered. Plan to start iHd, however has not occurred. New O2 requirement. New severe abdominal pain. PCCM, CCS, VVS consulted for hypoxia, abdominal pain + fluid collection, and atherosclerotic occlusions respectively.  Consults:  Nephrology CCS VVS PCCM  Procedures:    Significant Diagnostic Tests:  1/9 CXR> bilateral pulmonary opacities - PNA vs edema 1/11 CTA c/a/p- no PE. Bilat pulm opacities. Multiple atherosclerotic occlusions and high-grade occlusions: R SFA occlusion, bilat internal iliac  artery occulsion, bilat external iliac artery severe stenosis, progressed severe stenosis of R vertebral artery, moderate-severe stenosis proximal SMA. Small volume of pelvic free fluid. Diverticulosis  Micro Data:  1/9 COVID-19 positive 1/9 BCx> H flu, beta lactamase negative  1/9 UCx>  1/11 Tracheal aspirate>>   Antimicrobials:  Ampicillin 1/11>  COVID therapies: remdes 1/10> Decadron 1/9>   Interim History / Subjective:  Received 1/2 of a unit of blood due to slow infusion rate.  SpO2 is 99% on 6L Moaning in pain from abdomen   After examining pt, primary advised that CCM consult was requested by rapid response.   Objective   Blood pressure 119/66, pulse 76, temperature (!) 97.1 F (36.2 C), resp. rate (!) 38, height 5\' 8"  (1.727 m), weight 61.2 kg, SpO2 97 %.       No intake or output data in the 24 hours ending 06/18/20 1824 Filed Weights   06/16/20 1204  Weight: 61.2 kg    Examination: General: Acutely ill appearing frail adult M, appears older than stated age. Supine in bed in mild distress due to pain HENT: NCAT. Poor dentition. Pink mm.  Lungs: Harsh bilateral rhonchi. Symmetrical chest expansion, no accessory muscle use. Shallow respirations, abdominal splinting due to pain.  Cardiovascular: rrr s1s2. BLE cap refill > 3 sec  Abdomen: Soft, mild distension. Very tender to palpation. Moaning from pain obscuring abdominal auscultation. R flank edema, erythema, small area of ecchymosis. Small amount of periumbilical petechiae.  Extremities: No obvious joint deformity. BLE  mottled.  Neuro: AAOx3 following commands GU: defer Skin: mottled lower extremities. Erythematous groin, dry skin Resolved Hospital Problem list     Assessment & Plan:   Acute hypoxemic respiratory failure: Multifactorial secondary to volume overload and COVID-19 pneumonia.  - Supplemental O2 to keep SpO2 > 90% - HD for volume removal planned for 2100 - Remdesivir and steroids per primary  service. -tracheal aspirate  - ABG and CXR now. No clear indication for BiPAP at present. Suspect some of the patient's labored appearance is due to severe abd pain  AGMA -suspect multifactorial -- renal failure, bacteremia, ?IA process - as below -bicarb before HD   H. 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 Presented with worsening renal failure.  Hyperkalemia - Nephrology planning to initiate hemodialysis.  - Trend BMP  Abdominal pain: CT demonstrates pelvic free fluid but no clear signs of infection or ischemia. Has been evaluated by general surgery.  - pain management per primary.   GI bleed: patient with BRBPR reported at home. FOBT in ED negative.  - Transfused one-half unit PRBC 1/11 - Trend CBC -  - Primary service to call GI consult  Arterial occlusive disease:  - Seen by vascular, no acute issues identified. Chronic disease.     Thank you for consulting PCCM. We will follow with you. Please let us know if patient's clinical status changes in interim.   Best practice (evaluated daily)  Diet: NPO Pain/Anxiety/Delirium protocol (if indicated): Fentanyl per primary VAP protocol (if indicated): NA DVT prophylaxis: SQH GI prophylaxis: Per primary Glucose control: Per primary Mobility: Bedrest Disposition: Progressive   Goals of Care:   Last date of multidisciplinary goals of care discussion:Per primary Family and staff present:  Summary of discussion:  Follow up goals of care discussion due:  Code Status: FULL  Labs   CBC: Recent Labs  Lab 06/16/20 1223 06/16/20 1547 06/17/20 1046 06/18/20 0343  WBC 10.5 12.0* 19.5* 15.8*  NEUTROABS  --   --  17.9* 15.6*  HGB 8.5* 8.0* 8.4* 7.8*  HCT 27.5* 25.2* 28.2* 23.9*  MCV 98.2 99.2 102.2* 95.6  PLT 160 146* 164 622    Basic Metabolic Panel: Recent Labs  Lab 06/16/20 1223 06/16/20 1547 06/17/20 1046  06/18/20 0343  NA 137  --  136 136  K 4.6  --  4.9 5.8*  CL 108  --  105 105  CO2 10*  --  <7* 7*  GLUCOSE 102*  --  126* 107*  BUN 86*  --  113* 131*  CREATININE 15.93* 15.82* 16.72* 17.14*  CALCIUM 7.7*  --  7.4* 6.9*  PHOS  --   --  >30.0*  --    GFR: Estimated Creatinine Clearance: 3.7 mL/min (A) (by C-G formula based on SCr of 17.14 mg/dL (H)). Recent Labs  Lab 06/16/20 1223 06/16/20 1404 06/16/20 1547 06/16/20 1647 06/17/20 1046 06/18/20 0343  PROCALCITON  --  44.75  --   --   --   --   WBC 10.5  --  12.0*  --  19.5* 15.8*  LATICACIDVEN  --  1.2  --  1.1  --   --     Liver Function Tests: Recent Labs  Lab 06/16/20 1223 06/17/20 1046 06/18/20 0343  AST 15 23 27   ALT 11 14 18   ALKPHOS 37* 36* 46  BILITOT 0.2* 0.6 0.4  PROT 5.0* 4.8* 4.7*  ALBUMIN 2.2*  2.0* 1.8*   No results for input(s): LIPASE, AMYLASE in the last 168 hours. No results for input(s): AMMONIA in the last 168 hours.  ABG    Component Value Date/Time   TCO2 17 (L) 03/28/2020 0717     Coagulation Profile: Recent Labs  Lab 06/16/20 1404  INR 1.2    Cardiac Enzymes: No results for input(s): CKTOTAL, CKMB, CKMBINDEX, TROPONINI in the last 168 hours.  HbA1C: Hgb A1c MFr Bld  Date/Time Value Ref Range Status  11/10/2017 07:21 AM 5.4 4.8 - 5.6 % Final    Comment:    (NOTE) Pre diabetes:          5.7%-6.4% Diabetes:              >6.4% Glycemic control for   <7.0% adults with diabetes   12/25/2016 03:50 AM 5.4 4.8 - 5.6 % Final    Comment:    (NOTE)         Pre-diabetes: 5.7 - 6.4         Diabetes: >6.4         Glycemic control for adults with diabetes: <7.0     CBG: No results for input(s): GLUCAP in the last 168 hours.  Review of Systems:   + cough + SOB + BRBPR + abdominal pain + nausea + back pain + flank pain   Past Medical History:  He,  has a past medical history of Asthma, Carotid stenosis (09/12/2019), Claudication in peripheral vascular disease ([place])  (01/06/8298), Complication of anesthesia, COPD (chronic obstructive pulmonary disease) ([place]), GERD (gastroesophageal reflux disease), HLD (hyperlipidemia), Hypertension, PFO with atrial septal aneurysm, Stroke ([place]), [name] (transient ischemic attack) (11/2017), and Tobacco abuse.   Surgical History:   Past Surgical History:  Procedure Laterality Date  . AV FISTULA PLACEMENT Left 03/28/2020   Procedure: ARTERIOVENOUS (AV) FISTULA CREATION LEFT;  Surgeon: [name], MD;  Location: [place];  Service: Vascular;  Laterality: Left;  . COLONOSCOPY    . FINGER SURGERY       Social History:   reports that he quit smoking about 11 months ago. His smoking use included cigarettes. He has a 50.00 pack-year smoking history. He has never used smokeless tobacco. He reports current alcohol use of about 1.0 - 3.0 standard drink of alcohol per week. He reports that he does not use drugs.   Family History:  His family history includes Colon cancer in his father; Hypertension in his mother; Stomach cancer in his brother.   Allergies No Known Allergies   Home Medications  Prior to Admission medications   Medication Sig Start Date End Date Taking? Authorizing Provider  acetaminophen (TYLENOL) 650 MG CR tablet Take 1,300 mg by mouth every 8 (eight) hours as needed for pain or fever.   Yes [provider]  albuterol (VENTOLIN HFA) 108 (90 Base) MCG/ACT inhaler Inhale 1-2 puffs into the lungs every 6 (six) hours as needed for wheezing or shortness of breath.   Yes [provider]  amLODipine (NORVASC) 10 MG tablet Take 1 tablet (10 mg total) by mouth daily. 08/30/19  Yes Kerin Perna, NP  furosemide (LASIX) 20 MG tablet Take 20 mg by mouth daily as needed for fluid.    Yes [provider]  metoprolol tartrate (LOPRESSOR) 25 MG tablet Take 25 mg by mouth 2 (two) times daily. 04/23/20  Yes [provider]  nitroGLYCERIN (NITROSTAT) 0.4 MG SL tablet Place 1 tablet (0.4  mg total) under the tongue every 5 (  five) minutes x 3 doses as needed for chest pain. Patient taking differently: Place 0.4 mg under the tongue every 5 (five) minutes as needed for chest pain. 07/23/19  Yes Aline August, MD  sodium bicarbonate 650 MG tablet Take 650 mg by mouth daily as needed for heartburn. 02/29/20  Yes [provider]      CCT: [place] MSN, AGACNP-BC [place] [phone] If no answer, [phone] 06/18/2020, 6:52 PM

## 2020-06-18 NOTE — Progress Notes (Addendum)
PCCM Interval progress note:  Pt re-assessed overnight with Dr. Tacy Learn, currently on 10L [place] sats 86-92%, tachypeic with course upper airway sounds and secretions and rhonchi/expiratory wheezing bilaterally.   He is mentating and states that he feels about the same as he did earlier.   He is scheduled for dialysis overnight tonight.  CKD stage V, states that he still makes some urine.   Also noted, the L hand is cool with palmar mottling and cyanosis, fistula thrill noted proximally, but not able to palpate radial pulse.  Pt denies pain, does have some decreased sensation.    P: -Recommend transition to HFNC, respiratory status somewhat improved after NT suction, repeat as needed - additional Lasix 80mg  x1  -duonebs -If L hand cyanosis is progressive or new, may need vascular consult, defer to primary team -dialysis tonight as scheduled   6:07 AM Pt unable to be dialyzed as fistula infiltrated, HD contacted Dr. Justin Mend who said they would try again today.  Pt transferred to ICU.  Remains on 10L with O2 sats>90%.  Continued metabolic acidosis.  On review of prior vascular notes, pt has had L hand numbness since October, no pain or weakness now.  Minimal pulse with doppler  -Hopefully pt can be dialyzed this AM, may need temporary HD cath if unable to use fistula -continue Bicarb gtt and give additional 2 amps  -Likely needs vascular consult during the day  Otilio Carpen Khailee Mick, PA-C

## 2020-06-18 NOTE — Progress Notes (Signed)
   06/18/20 2010  Assess: MEWS Score  Temp 97.8 F (36.6 C)  BP (!) 141/60  Pulse Rate 74  ECG Heart Rate 74  Resp (!) 33  SpO2 94 %  Assess: MEWS Score  MEWS Temp 0  MEWS Systolic 0  MEWS Pulse 0  MEWS RR 2  MEWS LOC 0  MEWS Score 2  MEWS Score Color Yellow  Assess: if the MEWS score is Yellow or Red  Were vital signs taken at a resting state? Yes  Focused Assessment No change from prior assessment  Early Detection of Sepsis Score *See Row Information* High  MEWS guidelines implemented *See Row Information* Yes  Treat  MEWS Interventions Administered scheduled meds/treatments;Administered prn meds/treatments;Escalated (See documentation below);Consulted Respiratory Therapy  Pain Scale 0-10  Pain Score 10  Pain Type Acute pain  Pain Location Abdomen  Pain Orientation Left;Right  Pain Descriptors / Indicators Cramping;Grimacing;Moaning  Pain Onset Progressive  Pain Intervention(s) Medication (See eMAR)  Take Vital Signs  Increase Vital Sign Frequency  Yellow: Q 2hr X 2 then Q 4hr X 2, if remains yellow, continue Q 4hrs  Escalate  MEWS: Escalate Yellow: discuss with charge nurse/RN and consider discussing with provider and RRT  Notify: Charge Nurse/RN  Name of Charge Nurse/RN Notified Anna, RN  Date Charge Nurse/RN Notified 06/18/20  Time Charge Nurse/RN Notified 2100  Document  Patient Outcome Not stable and remains on department  Progress note created (see row info) Yes   Yellow MEWS protocol initiated

## 2020-06-18 NOTE — Progress Notes (Signed)
Went to evaluate patient due to concern for increasing respiratory distress and the fact that they were planning to give the patient blood. Found patient breathing with increased work of breathing on room air, satting 100%. Patient endorses that his breathing difficulty has has worsened since earlier and that his abdominal pain is stable at this time. Upon listen to the patient's lungs and noted that he did sound more congested than my exam earlier today.  As this patient was planning to receive HD today I did reach out to nephrology prior to the patient receiving the ordered packed RBCs. Nephrology stated that it was okay to go ahead and give the blood and he recommended given 120 mg IV Lasix in addition and that they would plan to get the patient up soon for dialysis.  Have placed orders for lasix and informed nursing that it is alright to proceed with the pRBCs.

## 2020-06-18 NOTE — ED Notes (Signed)
Lunch Tray Ordered @ [id number]. 

## 2020-06-18 NOTE — ED Notes (Signed)
No needs from pt at this time. 

## 2020-06-18 NOTE — ED Notes (Signed)
Pt reports sudden sharp back pain radiating to both legs on awakening; says has never had this pain before.  Hospitalist paged.

## 2020-06-18 NOTE — ED Notes (Signed)
Admitting MD to bedside to reassess pt resp status.

## 2020-06-18 NOTE — Progress Notes (Signed)
Attempted to cannulate pt's LAVF for first time when site infiltrated. Attempted to move upon infiltrate with second one infiltrating. Notified Dr. Justin Mend. Dr states. "Just send him back to his room there's nothing we can do tonight and will try tomorrow." HD RN did let Dr. Gwyndolyn Kaufman pt is very SOB, but labs are good.

## 2020-06-18 NOTE — ED Notes (Signed)
SDU Breakfast Ordered 

## 2020-06-18 NOTE — ED Notes (Signed)
Pt oxygen increased to 6L [place]. Pt continues to have increased work of breathing.

## 2020-06-18 NOTE — Progress Notes (Cosign Needed Addendum)
PM check Nursing staff present when entering the room giving oral medications. Patient able to tolerate oral medications with some difficulty due to increased work of breathing. Occasionally yelling out to what appears to be in pain. When questioned if he is yelling out because he is in pain or struggling to breath, he states it is due to his breathing. Currently on 6L and was hypoxic down to 86%. Discussed with nursing breathing treatment, and will put on CPAP. Lung exam remarkable for diffuse bilateral wheezing and rales. Visible subcostal retractions and belly breathing. No guarding with abdominal exam. Clear decline from my initial visit with him in the ED 2 evenings ago. Will page CCM to discuss overnight plan. Additionally mentioned to nurse attempting to prone.   Albert Springer Autry-Lott, DO 06/18/2020, 9:31 PM PGY-2, [place]

## 2020-06-18 NOTE — ED Notes (Signed)
Lunch Tray Ordered @ [id number].

## 2020-06-18 NOTE — Progress Notes (Signed)
FPTS Interim Progress Note  S: Patient still comnplaining of severe abdominal back and leg pain.  Also indicates difficulty breathing.  O: BP (!) 125/110   Pulse 74   Temp 97.7 F (36.5 C) (Rectal)   Resp (!) 33   Ht 5\' 8"  (1.727 m)   Wt 61.2 kg   SpO2 94%   BMI 20.53 kg/m    Physical Exam Constitutional:      General: He is in acute distress.  Cardiovascular:     Rate and Rhythm: Normal rate and regular rhythm.  Pulmonary:     Effort: Respiratory distress present.     Comments: Increased work of breathing, still having coarse breath sounds.   Abdominal:     Tenderness: There is abdominal tenderness.  Neurological:     Mental Status: He is alert.     A/P: - Instructed nurse to give repeat Fentanyl dose for pain - Spoke with Vascular given significant mottling not appreciated earlier this AM, did not feel patient would not tolerate Heparin or further intervention given current overall clinical picture   Delora Fuel, MD 06/18/2020, 3:13 PM PGY-1, [facility] pager [phone]

## 2020-06-19 ENCOUNTER — Inpatient Hospital Stay (HOSPITAL_COMMUNITY): Payer: Medicare Other

## 2020-06-19 ENCOUNTER — Encounter (HOSPITAL_COMMUNITY): Payer: Medicare Other

## 2020-06-19 DIAGNOSIS — N185 Chronic kidney disease, stage 5: Secondary | ICD-10-CM

## 2020-06-19 DIAGNOSIS — U071 COVID-19: Secondary | ICD-10-CM | POA: Diagnosis not present

## 2020-06-19 DIAGNOSIS — N179 Acute kidney failure, unspecified: Secondary | ICD-10-CM

## 2020-06-19 LAB — RENAL FUNCTION PANEL
Albumin: 1.7 g/dL — ABNORMAL LOW (ref 3.5–5.0)
Anion gap: 22 — ABNORMAL HIGH (ref 5–15)
BUN: 113 mg/dL — ABNORMAL HIGH (ref 8–23)
CO2: 16 mmol/L — ABNORMAL LOW (ref 22–32)
Calcium: 7 mg/dL — ABNORMAL LOW (ref 8.9–10.3)
Chloride: 102 mmol/L (ref 98–111)
Creatinine, Ser: 12.34 mg/dL — ABNORMAL HIGH (ref 0.61–1.24)
GFR, Estimated: 4 mL/min — ABNORMAL LOW (ref >60)
Glucose, Bld: 124 mg/dL — ABNORMAL HIGH (ref 70–99)
Phosphorus: >30 mg/dL — ABNORMAL HIGH (ref 2.5–4.6)
Potassium: 4.4 mmol/L (ref 3.5–5.1)
Sodium: 140 mmol/L (ref 135–145)

## 2020-06-19 LAB — TYPE AND SCREEN
ABO/RH(D): O POS
Antibody Screen: NEGATIVE
Unit division: 0

## 2020-06-19 LAB — CBC WITH DIFFERENTIAL/PLATELET
Abs Immature Granulocytes: 0 10*3/uL (ref 0.00–0.07)
Basophils Absolute: 0 10*3/uL (ref 0.0–0.1)
Basophils Relative: 0 %
Eosinophils Absolute: 0 10*3/uL (ref 0.0–0.5)
Eosinophils Relative: 0 %
HCT: 28.4 % — ABNORMAL LOW (ref 39.0–52.0)
Hemoglobin: 9.2 g/dL — ABNORMAL LOW (ref 13.0–17.0)
Lymphocytes Relative: 7 %
Lymphs Abs: 0.5 10*3/uL — ABNORMAL LOW (ref 0.7–4.0)
MCH: 30.4 pg (ref 26.0–34.0)
MCHC: 32.4 g/dL (ref 30.0–36.0)
MCV: 93.7 fL (ref 80.0–100.0)
Monocytes Absolute: 0.2 10*3/uL (ref 0.1–1.0)
Monocytes Relative: 2 %
Neutro Abs: 6.8 10*3/uL (ref 1.7–7.7)
Neutrophils Relative %: 91 %
Platelets: 128 10*3/uL — ABNORMAL LOW (ref 150–400)
RBC: 3.03 MIL/uL — ABNORMAL LOW (ref 4.22–5.81)
RDW: 16 % — ABNORMAL HIGH (ref 11.5–15.5)
WBC: 7.5 10*3/uL (ref 4.0–10.5)
nRBC: 4.4 % — ABNORMAL HIGH (ref 0.0–0.2)
nRBC: 8 /100 WBC — ABNORMAL HIGH

## 2020-06-19 LAB — COMPREHENSIVE METABOLIC PANEL
ALT: 21 U/L (ref 0–44)
AST: 26 U/L (ref 15–41)
Albumin: 1.8 g/dL — ABNORMAL LOW (ref 3.5–5.0)
Alkaline Phosphatase: 41 U/L (ref 38–126)
Anion gap: 26 — ABNORMAL HIGH (ref 5–15)
BUN: 155 mg/dL — ABNORMAL HIGH (ref 8–23)
CO2: 8 mmol/L — ABNORMAL LOW (ref 22–32)
Calcium: 7 mg/dL — ABNORMAL LOW (ref 8.9–10.3)
Chloride: 104 mmol/L (ref 98–111)
Creatinine, Ser: 19.01 mg/dL — ABNORMAL HIGH (ref 0.61–1.24)
GFR, Estimated: 2 mL/min — ABNORMAL LOW (ref >60)
Glucose, Bld: 114 mg/dL — ABNORMAL HIGH (ref 70–99)
Potassium: 5.4 mmol/L — ABNORMAL HIGH (ref 3.5–5.1)
Sodium: 138 mmol/L (ref 135–145)
Total Bilirubin: 0.5 mg/dL (ref 0.3–1.2)
Total Protein: 4.6 g/dL — ABNORMAL LOW (ref 6.5–8.1)

## 2020-06-19 LAB — POCT I-STAT 7, (LYTES, BLD GAS, ICA,H+H)
Acid-base deficit: 18 mmol/L — ABNORMAL HIGH (ref 0.0–2.0)
Bicarbonate: 8.6 mmol/L — ABNORMAL LOW (ref 20.0–28.0)
Calcium, Ion: 0.94 mmol/L — ABNORMAL LOW (ref 1.15–1.40)
HCT: 26 % — ABNORMAL LOW (ref 39.0–52.0)
Hemoglobin: 8.8 g/dL — ABNORMAL LOW (ref 13.0–17.0)
O2 Saturation: 92 %
Patient temperature: 96.6
Potassium: 5.2 mmol/L — ABNORMAL HIGH (ref 3.5–5.1)
Sodium: 135 mmol/L (ref 135–145)
TCO2: 9 mmol/L — ABNORMAL LOW (ref 22–32)
pCO2 arterial: 21.6 mmHg — ABNORMAL LOW (ref 32.0–48.0)
pH, Arterial: 7.202 — ABNORMAL LOW (ref 7.350–7.450)
pO2, Arterial: 71 mmHg — ABNORMAL LOW (ref 83.0–108.0)

## 2020-06-19 LAB — MRSA PCR SCREENING: MRSA by PCR: POSITIVE — AB

## 2020-06-19 LAB — LACTIC ACID, PLASMA: Lactic Acid, Venous: 1.4 mmol/L (ref 0.5–1.9)

## 2020-06-19 LAB — BPAM RBC
Blood Product Expiration Date: 202202092359
ISSUE DATE / TIME: 202201111108
Unit Type and Rh: 5100

## 2020-06-19 LAB — C-REACTIVE PROTEIN: CRP: 28.8 mg/dL — ABNORMAL HIGH (ref <1.0)

## 2020-06-19 LAB — GLUCOSE, CAPILLARY
Glucose-Capillary: 108 mg/dL — ABNORMAL HIGH (ref 70–99)
Glucose-Capillary: 108 mg/dL — ABNORMAL HIGH (ref 70–99)
Glucose-Capillary: 109 mg/dL — ABNORMAL HIGH (ref 70–99)

## 2020-06-19 MED ORDER — MUPIROCIN 2 % EX OINT
1.0000 "application " | TOPICAL_OINTMENT | Freq: Two times a day (BID) | CUTANEOUS | Status: AC
  Administered 2020-06-19 – 2020-06-23 (×9): 1 via NASAL
  Filled 2020-06-19 (×2): qty 22

## 2020-06-19 MED ORDER — FENTANYL CITRATE (PF) 100 MCG/2ML IJ SOLN
INTRAMUSCULAR | Status: AC
  Administered 2020-06-19: 50 ug via INTRAVENOUS
  Filled 2020-06-19: qty 2

## 2020-06-19 MED ORDER — DARBEPOETIN ALFA 60 MCG/0.3ML IJ SOSY
60.0000 ug | PREFILLED_SYRINGE | INTRAMUSCULAR | Status: DC
  Administered 2020-06-26: 60 ug via INTRAVENOUS
  Filled 2020-06-19: qty 0.3

## 2020-06-19 MED ORDER — SODIUM BICARBONATE 8.4 % IV SOLN
100.0000 meq | Freq: Once | INTRAVENOUS | Status: AC
  Administered 2020-06-19: 100 meq via INTRAVENOUS
  Filled 2020-06-19: qty 50

## 2020-06-19 MED ORDER — LIP MEDEX EX OINT
TOPICAL_OINTMENT | CUTANEOUS | Status: DC | PRN
  Filled 2020-06-19: qty 7

## 2020-06-19 MED ORDER — SODIUM BICARBONATE 8.4 % IV SOLN
INTRAVENOUS | Status: AC
  Filled 2020-06-19: qty 50

## 2020-06-19 MED ORDER — HEPARIN SODIUM (PORCINE) 1000 UNIT/ML DIALYSIS
1000.0000 [IU] | INTRAMUSCULAR | Status: DC | PRN
  Administered 2020-06-21 – 2020-06-23 (×2): 2800 [IU] via INTRAVENOUS_CENTRAL
  Filled 2020-06-19: qty 6
  Filled 2020-06-19: qty 4
  Filled 2020-06-19 (×4): qty 6

## 2020-06-19 MED ORDER — FENTANYL CITRATE (PF) 100 MCG/2ML IJ SOLN
25.0000 ug | INTRAMUSCULAR | Status: DC | PRN
Start: 2020-06-19 — End: 2020-06-23
  Administered 2020-06-19 – 2020-06-20 (×5): 25 ug via INTRAVENOUS
  Filled 2020-06-19 (×6): qty 2

## 2020-06-19 MED ORDER — FENTANYL CITRATE (PF) 100 MCG/2ML IJ SOLN: 50.0000 ug | Freq: Once | INTRAMUSCULAR | Status: AC

## 2020-06-19 MED ORDER — CALCIUM GLUCONATE-NACL 2-0.675 GM/100ML-% IV SOLN
2.0000 g | Freq: Once | INTRAVENOUS | Status: AC
  Administered 2020-06-19: 2000 mg via INTRAVENOUS
  Filled 2020-06-19: qty 100

## 2020-06-19 MED ORDER — DARBEPOETIN ALFA 60 MCG/0.3ML IJ SOSY
60.0000 ug | PREFILLED_SYRINGE | Freq: Once | INTRAMUSCULAR | Status: AC
  Administered 2020-06-19: 60 ug via SUBCUTANEOUS
  Filled 2020-06-19: qty 0.3

## 2020-06-19 MED ORDER — PRISMASOL BGK 0/2.5 32-2.5 MEQ/L EC SOLN
Status: DC
  Filled 2020-06-19 (×22): qty 5000

## 2020-06-19 MED ORDER — IPRATROPIUM-ALBUTEROL 20-100 MCG/ACT IN AERS
1.0000 | INHALATION_SPRAY | Freq: Four times a day (QID) | RESPIRATORY_TRACT | Status: DC
  Administered 2020-06-19 – 2020-06-21 (×7): 1 via RESPIRATORY_TRACT
  Filled 2020-06-19: qty 4

## 2020-06-19 MED ORDER — PRISMASOL BGK 0/2.5 32-2.5 MEQ/L EC SOLN
Status: AC
  Filled 2020-06-19 (×9): qty 5000

## 2020-06-19 MED ORDER — PHENOL 1.4 % MT LIQD
1.0000 | OROMUCOSAL | Status: DC | PRN
  Administered 2020-06-19: 1 via OROMUCOSAL
  Filled 2020-06-19: qty 177

## 2020-06-19 MED ORDER — PRISMASOL BGK 0/2.5 32-2.5 MEQ/L EC SOLN
Status: AC
  Filled 2020-06-19 (×5): qty 5000

## 2020-06-19 MED ORDER — CHLORHEXIDINE GLUCONATE CLOTH 2 % EX PADS
6.0000 | MEDICATED_PAD | Freq: Every day | CUTANEOUS | Status: AC
  Administered 2020-06-19 – 2020-06-23 (×5): 6 via TOPICAL

## 2020-06-19 MED ORDER — SODIUM CHLORIDE 0.9 % IV SOLN
2.0000 g | Freq: Three times a day (TID) | INTRAVENOUS | Status: DC
  Administered 2020-06-19 – 2020-06-23 (×12): 2 g via INTRAVENOUS
  Filled 2020-06-19 (×6): qty 2000
  Filled 2020-06-19: qty 2
  Filled 2020-06-19: qty 2000
  Filled 2020-06-19: qty 2
  Filled 2020-06-19 (×5): qty 2000

## 2020-06-19 MED ORDER — SODIUM CHLORIDE 0.9 % IV SOLN
INTRAVENOUS | Status: DC | PRN
  Administered 2020-06-21: 20:00:00 500 mL via INTRAVENOUS

## 2020-06-19 NOTE — Progress Notes (Signed)
   VASCULAR SURGERY ASSESSMENT & PLAN:   CHRONIC MULTILEVEL ARTERIAL OCCLUSIVE DISEASE: This patient has Doppler signals at the femoral level.  I am unable to obtain Doppler signals in the feet but I am not sure that the Doppler that we have is functioning well.  He does have some venous refilling.  Given his multilevel disease I do not think that he has had an acute arterial event and his CT scan suggests chronic disease.  He was moved to the unit last night.  If he shows significant clinical improvement then we could consider arteriography Friday.  STAGE V CHRONIC KIDNEY DISEASE: They attempted to use his fistula yesterday and infiltrated.  I would recommend placement of a catheter as I did not think the fistula was ready for cannulation.  If he has a temporary catheter placed we can convert this to a tunneled dialysis catheter once he is improved clinically.   SUBJECTIVE:   Still with some shortness of breath.  PHYSICAL EXAM:   Vitals:   06/19/20 0530 06/19/20 0600 06/19/20 0602 06/19/20 0630  BP: 90/74 (!) 69/53 (!) 80/64 (!) 112/92  Pulse: 73 71 70 75  Resp: (!) 30 (!) 28 (!) 22 (!) 32  Temp:      TempSrc:      SpO2: 99% 100% 100% 97%  Weight:      Height:       Doppler signals at the femoral level bilaterally.  I cannot obtain Doppler signals in the feet although I am not sure that the Doppler is functioning well. He does have some venous filling and is able to move his feet with no significant pain.  LABS:   Lab Results  Component Value Date   WBC 7.5 06/19/2020   HGB 8.8 (L) 06/19/2020   HCT 26.0 (L) 06/19/2020   MCV 93.7 06/19/2020   PLT 128 (L) 06/19/2020   Lab Results  Component Value Date   CREATININE 19.01 (H) 06/19/2020   Lab Results  Component Value Date   INR 1.2 06/16/2020    PROBLEM LIST:    Active Problems:   COVID-19   CURRENT MEDS:   . sodium chloride   Intravenous Once  . amLODipine  10 mg Oral Daily  . Chlorhexidine Gluconate Cloth  6  each Topical Q0600  . Chlorhexidine Gluconate Cloth  6 each Topical Q0600  . darbepoetin (ARANESP) injection - DIALYSIS  60 mcg Intravenous Q Mon-HD  . dexamethasone  6 mg Oral Q24H  . feeding supplement (NEPRO CARB STEADY)  237 mL Oral BID BM  . influenza vaccine adjuvanted  0.5 mL Intramuscular Tomorrow-1000  . ipratropium-albuterol  3 mL Nebulization Q6H  . metoprolol tartrate  25 mg Oral BID  . mupirocin ointment  1 application Nasal BID  . pneumococcal 23 valent vaccine  0.5 mL Intramuscular Tomorrow-1000  . polyethylene glycol  17 g Oral Daily  . sevelamer carbonate  1,600 mg Oral TID WC  . sodium bicarbonate  100 mEq Intravenous Once  . sodium zirconium cyclosilicate  10 g Oral Once    [name] Office: [phone] 06/19/2020

## 2020-06-19 NOTE — Procedures (Signed)
Central Venous Catheter Insertion Procedure Note  Albert Harris  [id number]  [birth year]  Date:06/19/20  Time:9:53 AM   Provider Performing:Wissam Resor E Hayward Rylander   Procedure: Insertion of Non-tunneled Central Venous Catheter(36556)with US guidance (53976)    Indication(s) Hemodialysis  Consent Risks of the procedure as well as the alternatives and risks of each were explained to the patient and/or caregiver.  Consent for the procedure was obtained and is signed in the bedside chart  Anesthesia Topical only with 1% lidocaine   Timeout Verified patient identification, verified procedure, site/side was marked, verified correct patient position, special equipment/implants available, medications/allergies/relevant history reviewed, required imaging and test results available.  Sterile Technique Maximal sterile technique including full sterile barrier drape, hand hygiene, sterile gown, sterile gloves, mask, hair covering, sterile ultrasound probe cover (if used).  Procedure Description Area of catheter insertion was cleaned with chlorhexidine and draped in sterile fashion.   With real-time ultrasound guidance a 20cm HD catheter was placed into the right internal jugular vein, at 15cm. Anchored and sutured at insertion site, with additional mobile butterfly securement placed and sutured. Remaining external 5cm of catheter secured with Loc device. Nonpulsatile blood flow and easy flushing noted in all ports. The catheter was sutured in place and sterile dressing applied, place   Complications/Tolerance None; patient tolerated the procedure well. Chest X-ray is ordered to verify placement for internal jugular placement, and reveals tip of catheter in expected position.   EBL Minimal  Specimen(s) None   Eliseo Gum MSN, AGACNP-BC [place] [phone] If no answer, [phone] 06/19/2020, 9:58 AM

## 2020-06-19 NOTE — Progress Notes (Signed)
Arterial blood gas drawn while patient was wearing 10lpm [place]. Results given to CCM

## 2020-06-19 NOTE — Progress Notes (Signed)
[facility] Progress Note    Assessment/ Plan:   # CKD stage V with progression to ESRD  - setting of covid superimposed on advanced CKD   - starting CCRT today due to urgency and staffing issues -all 2k bags, targeting net neg 50-100cc/hr to start with -appreciate ccm's assistance with access placement. RIJ temp line placed 1/12 - Check strict ins/outs -CLIP once more stable and out of ICU  # Acute hypoxic respiratory failure - Secondary to covid PNA  -per primary service: remdesivir+steroids  # Covid 19 PNA   - per primary service - optimize volume status   # HTN  - BP acceptable, expecting drop/improvement with ultrafiltration  # Anemia of CKD  - transfuse prn for hgb <7 -concern for gib w/ brbpr, gi consulted  # Metabolic acidosis (+ag) 2/2 advanced CKD - Starting dialysis -bicarb gtt can be stopped since transitioning to crrt  # Secondary hyperparathyroidism  - PTH was 201 on 06/10/20. Phos >30 -renvela, dose increased - agree with renal diet  #Hyperkalemia -CRRT, all 2k bags  Recommendations conveyed to the primary service.  [name], MD [facility]  Subjective:   Moved to icu this am. Worsening resp status. Infiltrated avf x2. Temp line placed. Planning for CRRT today due to dialysis staffing and urgency.   Objective:   BP (!) 112/92 (BP Location: Right Wrist)   Pulse 75   Temp (!) 96.7 F (35.9 C) (Axillary)   Resp (!) 32   Ht 5\' 8"  (1.727 m)   Wt 63.7 kg   SpO2 97%   BMI 21.35 kg/m   Intake/Output Summary (Last 24 hours) at 06/19/2020 1023 Last data filed at 06/19/2020 0600 Gross per 24 hour  Intake 657.06 ml  Output 0 ml  Net 657.06 ml   Weight change:   Physical Exam: Gen: ill appearing, in distress 2/2 sob Resp: coarse breath sounds bilaterally, labored/increased wob Cardiac: tachycardic Abd: soft Access: rij temp line c/d/i, lue avf Neuro: awake, follows commands, tremulous  Imaging: DG  CHEST PORT 1 VIEW  Result Date: 06/19/2020 CLINICAL DATA:  Central line placement.  COVID-19 positive. EXAM: PORTABLE CHEST 1 VIEW COMPARISON:  June 18, 2020. FINDINGS: Stable cardiomegaly. No pneumothorax or pleural effusion is noted. Interval placement of right internal jugular catheter with distal tip in expected position of the SVC. Stable bilateral lung opacities are noted consistent with multifocal pneumonia. Bony thorax is unremarkable. IMPRESSION: Interval placement of right internal jugular catheter with distal tip in expected position of the SVC. No pneumothorax is noted. Electronically Signed   By: Marijo Conception M.D.   On: 06/19/2020 09:30   DG Chest Port 1 View  Result Date: 06/18/2020 CLINICAL DATA:  Dyspnea.  COVID positive EXAM: PORTABLE CHEST 1 VIEW COMPARISON:  06/16/2020 FINDINGS: Improving bilateral airspace disease predominantly in the bases. Cardiac enlargement without heart failure.  No effusion. IMPRESSION: Improving bilateral airspace disease Electronically Signed   By: Franchot Gallo M.D.   On: 06/18/2020 18:58   CT Angio Chest/Abd/Pel for Dissection W and/or W/WO  Result Date: 06/18/2020 CLINICAL DATA:  65 year old male with sudden onset severe pain radiating to the back. Generalized weakness. Recent shortness of breath for 5 or 6 days. EXAM: CT ANGIOGRAPHY CHEST, ABDOMEN AND PELVIS TECHNIQUE: Precontrast CT chest followed by Multidetector CT imaging through the chest, abdomen and pelvis was performed using the standard protocol during bolus administration of intravenous contrast. Multiplanar reconstructed images and MIPs were obtained and reviewed to evaluate the  vascular anatomy. CONTRAST:  168mL OMNIPAQUE IOHEXOL 350 MG/ML SOLN COMPARISON:  Portable chest x-ray 06/16/2020 and earlier. No prior chest, abdomen and Pelvis CT. CTA head and neck 12/24/2016 FINDINGS: CTA CHEST FINDINGS Cardiovascular: Calcified coronary artery and thoracic aortic atherosclerosis. Negative for  thoracic aortic dissection or aneurysm. Proximal right vertebral artery severe stenosis versus occlusion (series 6, image 7), progressed since the 2018 CTA where severe stenosis was demonstrated. Other proximal great vessels appear to remain patent. Borderline to mild cardiomegaly.  No pericardial effusion. Central pulmonary artery and major bilateral pulmonary artery branches also appear to remain patent. Mediastinum/Nodes: Negative. Lungs/Pleura: Major airways are patent. Widely scattered bilateral peribronchial and peripheral pulmonary ground-glass opacity, trending toward consolidation in both lower lobe posterior basal segments, the medial segment of the right middle lobe. Consolidation also in the anterior basal segment of the left lower lobe. Left upper lobe and right lung apex relatively spared. No pleural effusion. Musculoskeletal: No acute osseous abnormality identified. Review of the MIP images confirms the above findings. CTA ABDOMEN AND PELVIS FINDINGS VASCULAR Extensive Aortoiliac calcified atherosclerosis. Negative for abdominal aortic aneurysm or dissection. Bulky soft plaque in the proximal SMA (series 6, image 110) which appears moderately to severely stenotic but remains patent. Other major aortic branches appear patent. Bilateral main renal artery stenosis also suspected. Bilateral proximal internal iliac arteries appear occluded due to plaque but are probably reconstituted distally. Bilateral external iliac arteries are patent but with severe bilateral stenosis (series 6, image 174 on the right). The right superficial femoral artery is occluded from its origin (series 6, image 209). The left SFA is patent but severely stenotic (image 217). Proximal deep femoral arteries appear patent. Review of the MIP images confirms the above findings. NON-VASCULAR Hepatobiliary: Contracted gallbladder. Liver enhancement within normal limits. No bile duct enlargement. Pancreas: Partially degraded by motion  artifact, no pancreatic mass or inflammation is evident. Spleen: Negative. Adrenals/Urinary Tract: Normal adrenal glands. Bilateral renal vascular calcifications. No hydronephrosis. Renal enhancement appears symmetric. Ureters and urinary bladder are decompressed. Pelvic phleboliths greater on the left. Stomach/Bowel: Decompressed sigmoid colon and rectum. Extensive sigmoid diverticulosis, although no convincing active inflammation. Mild to moderate descending diverticulosis with no inflammation identified. Mild motion artifact through the transverse colon which appears negative. Decompressed right colon. Normal gas containing appendix. The cecum is decompressed. Negative terminal ileum. No dilated small bowel. Motion artifact in the upper abdomen. Stomach and duodenum are grossly normal. No pneumoperitoneum. No free fluid in the abdomen. Lymphatic: No lymphadenopathy. Reproductive: Negative. Other: Nonspecific small volume pelvic free fluid with simple fluid density (series 6, image 190). Musculoskeletal: No acute osseous abnormality identified. Review of the MIP images confirms the above findings. IMPRESSION: 1. Negative for aortic dissection or aneurysm. Aortic Atherosclerosis (ICD10-I70.0). 2. Positive for bilateral pulmonary opacity most compatible with acute viral/atypical respiratory infection, suspicious for COVID-19. No pleural effusion. 3. Positive also for multiple atherosclerotic occlusions and high-grade stenoses: - occluded right superficial femoral artery. - occluded bilateral internal iliac arteries. - severe stenosis bilateral external iliac arteries, left SFA. - progressed severe stenosis versus occlusion of the proximal right vertebral artery since a 2018 Neck CTA. - bulky soft plaque with moderate to severe stenosis in the proximal SMA. 4. Small volume of pelvic free fluid is abnormal but nonspecific, with no superimposed inflammatory process identified in the abdomen or pelvis. 5. Diverticulosis  of the descending and sigmoid colon. Electronically Signed   By: Genevie Ann M.D.   On: 06/18/2020 08:51    Labs: [employer]  Recent Labs  Lab 06/16/20 1223 06/16/20 1547 06/17/20 1046 06/18/20 0343 06/18/20 1836 06/19/20 0042 06/19/20 0322  NA 137  --  136 136 135 138 135  K 4.6  --  4.9 5.8* 5.9* 5.4* 5.2*  CL 108  --  105 105  --  104  --   CO2 10*  --  <7* 7*  --  8*  --   GLUCOSE 102*  --  126* 107*  --  114*  --   BUN 86*  --  113* 131*  --  155*  --   CREATININE 15.93* 15.82* 16.72* 17.14*  --  19.01*  --   CALCIUM 7.7*  --  7.4* 6.9*  --  7.0*  --   PHOS  --   --  >30.0*  --   --   --   --    CBC Recent Labs  Lab 06/16/20 1547 06/17/20 1046 06/18/20 0343 06/18/20 1836 06/19/20 0042 06/19/20 0322  WBC 12.0* 19.5* 15.8*  --  7.5  --   NEUTROABS  --  17.9* 15.6*  --  6.8  --   HGB 8.0* 8.4* 7.8* 9.5* 9.2* 8.8*  HCT 25.2* 28.2* 23.9* 28.0* 28.4* 26.0*  MCV 99.2 102.2* 95.6  --  93.7  --   PLT 146* 164 165  --  128*  --     Medications:    . sodium chloride   Intravenous Once  . amLODipine  10 mg Oral Daily  . Chlorhexidine Gluconate Cloth  6 each Topical Q0600  . Chlorhexidine Gluconate Cloth  6 each Topical Q0600  . darbepoetin (ARANESP) injection - DIALYSIS  60 mcg Intravenous Q Mon-HD  . dexamethasone  6 mg Oral Q24H  . feeding supplement (NEPRO CARB STEADY)  237 mL Oral BID BM  . fentaNYL      . influenza vaccine adjuvanted  0.5 mL Intramuscular Tomorrow-1000  . Ipratropium-Albuterol  1 puff Inhalation Q6H  . metoprolol tartrate  25 mg Oral BID  . mupirocin ointment  1 application Nasal BID  . pneumococcal 23 valent vaccine  0.5 mL Intramuscular Tomorrow-1000  . polyethylene glycol  17 g Oral Daily  . sevelamer carbonate  1,600 mg Oral TID WC  . sodium zirconium cyclosilicate  10 g Oral Once      [name], MD [facility] 06/19/2020, 10:23 AM

## 2020-06-19 NOTE — Progress Notes (Addendum)
CC: Rectal bleeding, epigastric pain, shortness of breath admitted 06/16/2020  Subjective: Patient did not get dialyzed yesterday.  He was transferred to the ICU last evening.  He has a BiPAP standing by but has not required it.  They attempted to dialyze him yesterday and failed.  He is currently getting a central line placed for dialysis.  He remains severely uremic.  Nursing staff says he is just tender and complains of pain anyplace you touch him right now, even his legs.  He has not complained of abdominal pain.  Objective: Vital signs in last 24 hours: Temp:  [94.6 F (34.8 C)-98.9 F (37.2 C)] 96.7 F (35.9 C) (01/12 0452) Pulse Rate:  [61-78] 75 (01/12 0630) Resp:  [22-39] 32 (01/12 0630) BP: (69-161)/(53-110) 112/92 (01/12 0630) SpO2:  [90 %-100 %] 97 % (01/12 0630) Weight:  [63.7 kg] 63.7 kg (01/12 0300) Last BM Date: 06/17/20 657 IV  Nothing else recorded in I/O Afebrile, BP down early AM, better now 7.20, 21.6 PCO2, PO2 71, HCO# 8.6 Intake/Output from previous day: 01/11 0701 - 01/12 0700 In: 657.1 [I.V.:657.1] Out: 0  Intake/Output this shift: No intake/output data recorded.  General appearance: He is lying quietly under drape for central line placement. Resp: He is on a high flow nasal cannula sounds much better than yesterday. GI: His abdomen was soft and nontender on palpation.  He is draped for his central line so I did not observe the abdomen.  Lab Results:  Recent Labs    06/18/20 0343 06/18/20 1836 06/19/20 0042 06/19/20 0322  WBC 15.8*  --  7.5  --   HGB 7.8*   < > 9.2* 8.8*  HCT 23.9*   < > 28.4* 26.0*  PLT 165  --  128*  --    < > = values in this interval not displayed.    BMET Recent Labs    06/18/20 0343 06/18/20 1836 06/19/20 0042 06/19/20 0322  NA 136   < > 138 135  K 5.8*   < > 5.4* 5.2*  CL 105  --  104  --   CO2 7*  --  8*  --   GLUCOSE 107*  --  114*  --   BUN 131*  --  155*  --   CREATININE 17.14*  --  19.01*  --    CALCIUM 6.9*  --  7.0*  --    < > = values in this interval not displayed.   PT/INR Recent Labs    06/16/20 1404  LABPROT 14.7  INR 1.2    Recent Labs  Lab 06/16/20 1223 06/17/20 1046 06/18/20 0343 06/19/20 0042  AST 15 23 27 26   ALT 11 14 18 21   ALKPHOS 37* 36* 46 41  BILITOT 0.2* 0.6 0.4 0.5  PROT 5.0* 4.8* 4.7* 4.6*  ALBUMIN 2.2* 2.0* 1.8* 1.8*     Lipase  No results found for: LIPASE   Medications: . sodium chloride   Intravenous Once  . amLODipine  10 mg Oral Daily  . Chlorhexidine Gluconate Cloth  6 each Topical Q0600  . Chlorhexidine Gluconate Cloth  6 each Topical Q0600  . darbepoetin (ARANESP) injection - DIALYSIS  60 mcg Intravenous Q Mon-HD  . dexamethasone  6 mg Oral Q24H  . feeding supplement (NEPRO CARB STEADY)  237 mL Oral BID BM  . fentaNYL      . influenza vaccine adjuvanted  0.5 mL Intramuscular Tomorrow-1000  . Ipratropium-Albuterol  1 puff Inhalation Q6H  .  metoprolol tartrate  25 mg Oral BID  . mupirocin ointment  1 application Nasal BID  . pneumococcal 23 valent vaccine  0.5 mL Intramuscular Tomorrow-1000  . polyethylene glycol  17 g Oral Daily  . sevelamer carbonate  1,600 mg Oral TID WC  . sodium zirconium cyclosilicate  10 g Oral Once    Assessment/Plan COVID-19 pneumonia Severe respiratory distress  - Lasix 120 mg ordered Severe diffuse atherosclerotic peripheral vascular disease End-stage renal disease  - AV fistula creation 03/28/20 - not developed left arm  - Creatinine 17.14>>19.01  - K+ 5.2 COPD/Hx tobacco use Hx PFO with atrial septal aneurysm  - ECHO 07/18/19 EF 60-65%, LVH, grade II diastolic dysfunction, normal RV, RVSP 37.5 mm/Hg, mild RV regurgitation, LA dilatation, AV normal, Hx [name] Hypertension Anemia  - H/H 8.4/28.2 >> 7.8/23.9(1/11)  Abdominal pain, uncertain etiology with diffuse vascular disease  - lactate 1.2>>1.1>>1.0>>1.4  - WBC 10.5>>12.0>>19.5>>15.8>>7.5  FEN:  NPO TX:MIWOEHOZYY 1/11>> day 1 DVT:  add SCD's Follow up:  TBD  Plan: Ongoing medical management.  We will continue to follow.  His lactate is not elevated exam is not consistent with ischemic bowel.        LOS: 3 days    Albert Harris 06/19/2020 Please see Amion

## 2020-06-19 NOTE — Progress Notes (Signed)
PHARMACY NOTE:  ANTIMICROBIAL RENAL DOSAGE ADJUSTMENT  Current antimicrobial regimen includes a mismatch between antimicrobial dosage and estimated renal function.  As per policy approved by the Pharmacy & Therapeutics and Medical Executive Committees, the antimicrobial dosage will be adjusted accordingly.  Current antimicrobial dosage:  Ampicillin 2g IV every 12 hours  Indication: Haemophilus influenzae bacteremia (beta lactamase negative)  Renal Function:  Estimated Creatinine Clearance: 3.4 mL/min (A) (by C-G formula based on SCr of 19.01 mg/dL (H)). No urine output. []      On intermittent HD, scheduled: [x]      On CRRT - initiating today    Antimicrobial dosage has been changed to:  Ampicillin 2g IV every 8 hours with start of CRRT  Additional comments: Will continue to follow-up with CRRT vs HD plans and adjust therapy as indicated   Thank you for allowing pharmacy to be a part of this patient's care.  Sloan Leiter, PharmD, BCPS, BCCCP Clinical Pharmacist Please refer to [facility] for [place] numbers 06/19/2020 11:13 AM

## 2020-06-19 NOTE — Progress Notes (Signed)
NAME:  Albert Harris, MRN:  [id number], DOB:  02-24-1956, LOS: 3 ADMISSION DATE:  06/16/2020, CONSULTATION DATE:  06/18/20 REFERRING MD:  FMTS, CHIEF COMPLAINT:  Decompensation -- Hypoxia    Brief History:  65 yo M + COVID, bacteremia, CKD V, HFpEF admitted to [facility], who on 1/11 has had increasing O2 req, from RA to 6L (SpO2 94-99% on 6L)  Initially eval by PCCM - rec SDU admit and urgent HD. Unable to receive HD due to AVF infiltration. Moved to ICU 1/12 AM for HD placement and ongoing care  History of Present Illness:  64 yo M PMH HFpEF, CKD V, HTN, [name], PVD, COPD admitted to [facility] 1/19 for SOB. Found to be COVID-19 positive. CXR 1/9 with bilateral opacities, possibly pulm edema. In ED, Cr 15 -- plan to start [facility] 1/11. On 1/11 pt with hgb drop from 8.4 to 7.8, for which 1 PRBC was ordered and approx 1/2 unit was completed.  Pt is planned to start iHD 1/11, however this has not yet occurred.On 1/11 pt also with new O2 requirement -- now on 6LNC from RA, with resultant SpO2 94-99%. BCx have resulted with H Flu. Pt also c/o new severe abdominal pain -- CTA c/a/p obtained and reveal bilateral pulm opacities, multiple atherosclerotic occlusion and high grade stenoses, small volume of pelvic free fluid, diverticulosis.   VVS, CCS, and PCCM consulted in this setting.   Past Medical History:  PVD HFpEF CKDV HTN COPD Tobacco use disorder  GERD  Significant Hospital Events:  1/9 admitted to [facility], covid +. Presenting Cr 15.  1/11 BCx H.Flu. Hgb 7.8, 1 PRBC ordered. Plan to start iHd, however has not occurred. New O2 requirement. New severe abdominal pain. PCCM, CCS, VVS consulted for hypoxia, abdominal pain + fluid collection, and atherosclerotic occlusions respectively. 1/12 couldn't get HD overnight due to infiltrated fistula. Moved to ICU with progressive AGMA in this setting as will need temp HD cath + iHD. HD cath placed R IJ. Nephro plans for HD, possible CRRT   Consults:   Nephrology CCS VVS PCCM   Procedures:  1/12 R IJ HD cath>>   Significant Diagnostic Tests:  1/9 CXR> bilateral pulmonary opacities - PNA vs edema 1/11 CTA c/a/p- no PE. Bilat pulm opacities. Multiple atherosclerotic occlusions and high-grade occlusions: R SFA occlusion, bilat internal iliac artery occulsion, bilat external iliac artery severe stenosis, progressed severe stenosis of R vertebral artery, moderate-severe stenosis proximal SMA. Small volume of pelvic free fluid. Diverticulosis 1/12 CXR- interval placement of R IJ HD cath, tip in SVC. Stable bilateral pulmonary opacities.  Micro Data:  1/9 COVID-19 positive 1/9 BCx> H flu, beta lactamase negative  1/9 UCx>  1/11 Tracheal aspirate>>  1/12 MRSA PCR> pos  Antimicrobials:  Ampicillin 1/11>  COVID therapies: remdes 1/10> Decadron 1/9>   Interim History / Subjective:  Fistula infiltrated, couldn't get HD overnight With AGMA + increased RR for compensation + pulm edema, moved to ICU this morning as pt will need temp HD cath for HD   Discussed with CCS and nephro at bedside 1/12 AM   Objective   Blood pressure (!) 112/92, pulse 75, temperature (!) 96.7 F (35.9 C), temperature source Axillary, resp. rate (!) 32, height 5\' 8"  (1.727 m), weight 63.7 kg, SpO2 97 %.        Intake/Output Summary (Last 24 hours) at 06/19/2020 0719 Last data filed at 06/19/2020 0600 Gross per 24 hour  Intake 657.06 ml  Output 0 ml  Net 657.06  ml   Filed Weights   06/16/20 1204 06/19/20 0300  Weight: 61.2 kg 63.7 kg    Examination:  General: Acutely and chronically ill appearing adult M, appears older than stated age. Reclined in ICU bed, mild distress due to pain  HENT: NCAT pink mmm Anicteric sclera. Trachea midline  Lungs: Coarse rhonchi. Symmetrical chest expansion, no accessory use on [place]  Cardiovascular: RRR s1s2 no rgm + JVD  Abdomen: Soft, tender to light palpation- diffuse over all quadrants. Intermittent guarding.    Extremities: No obvious joint deformity. BLE mottled.  Neuro: AAOx4 following commands. Intermittent involuntary extremity twitching. PERRL 30mm  GU: condom cath  Skin: Mottled lower extremities. Cool distal BUE BLE extremities. Scattered petechiae over abdomen, R flank bruise  Resolved Hospital Problem list     Assessment & Plan:   Acute hypoxemic respiratory failure Acute hypoxic respiratory failure, multifactorial due to volume overload/pulm edema in setting of renal failure, COVID-19 PNA  P - Supplemental O2 to keep SpO2 > 90% -needs HD -- nephro likely planning for CRRT  - Remdesivir and steroids  - f/u tracheal aspirate  - No indication for BiPAP at present.   AGMA  -suspect multifactorial -- largely driven by renal failure, bacteremia as below P -allow incr RR for compensation  -bicarb gtt -- will dc when CRRT vs iHD initiated   H flu bacteremia - Ampicillin  - Follow cultures per sensitivities  CKD V, with progression to ESRD, uremia AVF infiltrated 1/11 overnight, couldn't get HD  BUN 155 Hyperkalemia - moved to ICU for acidemia, incr RR in this setting, need for HD cath placement -RIJ temp cath placed 1/12.  -neprho following -- likely CRRT initiation (vs iHD-- hemodynamically could tolerate iHD but may not be able to initiate this as promptly as CRRT)   Hypocalcemia -replace. Will also help temporize hyperkalemia   Abdominal pain: CT demonstrates pelvic free fluid but no clear signs of infection or ischemia - CCS following  GIB patient with BRBPR reported at home. FOBT in ED negative. Intermittent rectal bleeding since presentation, not a large volume - Transfused one-half unit PRBC 1/11 P - trend H/H. FPTS reportedly consulted GI , I do not see consult note -- If not consulted, will defer consultation 1/12  as Hgb is 9.2 on CBC, hemodynamics are stable.   Arterial occlusive disease Infiltrated AVF:  - VVS following - chronic changes  - rec placement of  temp HD cath, for alter conversion to tunneled     Best practice (evaluated daily)  Diet: NPO Pain/Anxiety/Delirium protocol (if indicated): Fentanyl per primary VAP protocol (if indicated): NA DVT prophylaxis: SQH GI prophylaxis: Per primary Glucose control: Per primary Mobility: Bedrest Disposition: ICU  Goals of Care:   Last date of multidisciplinary goals of care discussion: Per primary Family and staff present:  Summary of discussion:  Follow up goals of care discussion due: 1/18 Code Status: FULL Family update -- wife updated 1/12. Patient updated at bedside as well  Labs   CBC: Recent Labs  Lab 06/16/20 1223 06/16/20 1547 06/17/20 1046 06/18/20 0343 06/18/20 1836 06/19/20 0042 06/19/20 0322  WBC 10.5 12.0* 19.5* 15.8*  --  7.5  --   NEUTROABS  --   --  17.9* 15.6*  --  6.8  --   HGB 8.5* 8.0* 8.4* 7.8* 9.5* 9.2* 8.8*  HCT 27.5* 25.2* 28.2* 23.9* 28.0* 28.4* 26.0*  MCV 98.2 99.2 102.2* 95.6  --  93.7  --   PLT 160 146*  164 165  --  128*  --     Basic Metabolic Panel: Recent Labs  Lab 06/16/20 1223 06/16/20 1547 06/17/20 1046 06/18/20 0343 06/18/20 1836 06/19/20 0042 06/19/20 0322  NA 137  --  136 136 135 138 135  K 4.6  --  4.9 5.8* 5.9* 5.4* 5.2*  CL 108  --  105 105  --  104  --   CO2 10*  --  <7* 7*  --  8*  --   GLUCOSE 102*  --  126* 107*  --  114*  --   BUN 86*  --  113* 131*  --  155*  --   CREATININE 15.93* 15.82* 16.72* 17.14*  --  19.01*  --   CALCIUM 7.7*  --  7.4* 6.9*  --  7.0*  --   PHOS  --   --  >30.0*  --   --   --   --    GFR: Estimated Creatinine Clearance: 3.4 mL/min (A) (by C-G formula based on SCr of 19.01 mg/dL (H)). Recent Labs  Lab 06/16/20 1404 06/16/20 1547 06/16/20 1647 06/17/20 1046 06/18/20 0343 06/18/20 1912 06/19/20 0042  PROCALCITON 44.75  --   --   --   --   --   --   WBC  --  12.0*  --  19.5* 15.8*  --  7.5  LATICACIDVEN 1.2  --  1.1  --   --  1.0 1.4    Liver Function Tests: Recent Labs  Lab  06/16/20 1223 06/17/20 1046 06/18/20 0343 06/19/20 0042  AST 15 23 27 26   ALT 11 14 18 21   ALKPHOS 37* 36* 46 41  BILITOT 0.2* 0.6 0.4 0.5  PROT 5.0* 4.8* 4.7* 4.6*  ALBUMIN 2.2* 2.0* 1.8* 1.8*   No results for input(s): LIPASE, AMYLASE in the last 168 hours. No results for input(s): AMMONIA in the last 168 hours.  ABG    Component Value Date/Time   PHART 7.202 (L) 06/19/2020 0322   PCO2ART 21.6 (L) 06/19/2020 0322   PO2ART 71 (L) 06/19/2020 0322   HCO3 8.6 (L) 06/19/2020 0322   TCO2 9 (L) 06/19/2020 0322   ACIDBASEDEF 18.0 (H) 06/19/2020 0322   O2SAT 92.0 06/19/2020 0322     Coagulation Profile: Recent Labs  Lab 06/16/20 1404  INR 1.2    Cardiac Enzymes: No results for input(s): CKTOTAL, CKMB, CKMBINDEX, TROPONINI in the last 168 hours.  HbA1C: Hgb A1c MFr Bld  Date/Time Value Ref Range Status  11/10/2017 07:21 AM 5.4 4.8 - 5.6 % Final    Comment:    (NOTE) Pre diabetes:          5.7%-6.4% Diabetes:              >6.4% Glycemic control for   <7.0% adults with diabetes   12/25/2016 03:50 AM 5.4 4.8 - 5.6 % Final    Comment:    (NOTE)         Pre-diabetes: 5.7 - 6.4         Diabetes: >6.4         Glycemic control for adults with diabetes: <7.0     CBG: No results for input(s): GLUCAP in the last 168 hours.    CRITICAL CARE Performed by: Cristal Generous   Total critical care time: 40 minutes  Critical care time was exclusive of separately billable procedures and treating other patients. Critical care was necessary to treat or prevent imminent or life-threatening deterioration.  Critical care was time spent personally by me on the following activities: development of treatment plan with patient and/or surrogate as well as nursing, discussions with consultants, evaluation of patient's response to treatment, examination of patient, obtaining history from patient or surrogate, ordering and performing treatments and interventions, ordering and review of  laboratory studies, ordering and review of radiographic studies, pulse oximetry and re-evaluation of patient's condition.   Eliseo Gum MSN, AGACNP-BC [place] [phone] If no answer, [phone] 06/19/2020, 10:18 AM

## 2020-06-19 NOTE — Progress Notes (Signed)
RT note. Pt. Placed on 15L [place], sat 98%, resting comfortable, RT/RN will titrate as needed. VSS, RT will continue to monitor.

## 2020-06-19 NOTE — Progress Notes (Signed)
Left AVF graft  has been completed. Refer to [facility] under chart review to view preliminary results.   06/19/2020  1:12 PM Albert Harris, Albert Harris

## 2020-06-19 NOTE — Progress Notes (Signed)
Blood pressure readings fluctuating, limited extremity use for placing blood pressure cuff due to his chronic condition. E-link MD made aware. Orders given to continue to monitor.

## 2020-06-20 ENCOUNTER — Inpatient Hospital Stay (HOSPITAL_COMMUNITY): Payer: Medicare Other

## 2020-06-20 DIAGNOSIS — U071 COVID-19: Secondary | ICD-10-CM | POA: Diagnosis not present

## 2020-06-20 DIAGNOSIS — N179 Acute kidney failure, unspecified: Secondary | ICD-10-CM | POA: Diagnosis not present

## 2020-06-20 DIAGNOSIS — N185 Chronic kidney disease, stage 5: Secondary | ICD-10-CM | POA: Diagnosis not present

## 2020-06-20 LAB — CBC WITH DIFFERENTIAL/PLATELET
Abs Immature Granulocytes: 1.18 10*3/uL — ABNORMAL HIGH (ref 0.00–0.07)
Basophils Absolute: 0 10*3/uL (ref 0.0–0.1)
Basophils Relative: 0 %
Eosinophils Absolute: 0 10*3/uL (ref 0.0–0.5)
Eosinophils Relative: 0 %
HCT: 23.6 % — ABNORMAL LOW (ref 39.0–52.0)
Hemoglobin: 8.1 g/dL — ABNORMAL LOW (ref 13.0–17.0)
Immature Granulocytes: 11 %
Lymphocytes Relative: 1 %
Lymphs Abs: 0.1 10*3/uL — ABNORMAL LOW (ref 0.7–4.0)
MCH: 30.3 pg (ref 26.0–34.0)
MCHC: 34.3 g/dL (ref 30.0–36.0)
MCV: 88.4 fL (ref 80.0–100.0)
Monocytes Absolute: 0.5 10*3/uL (ref 0.1–1.0)
Monocytes Relative: 5 %
Neutro Abs: 8.8 10*3/uL — ABNORMAL HIGH (ref 1.7–7.7)
Neutrophils Relative %: 83 %
Platelets: 103 10*3/uL — ABNORMAL LOW (ref 150–400)
RBC: 2.67 MIL/uL — ABNORMAL LOW (ref 4.22–5.81)
RDW: 15.6 % — ABNORMAL HIGH (ref 11.5–15.5)
WBC: 10.7 10*3/uL — ABNORMAL HIGH (ref 4.0–10.5)
nRBC: 1.1 % — ABNORMAL HIGH (ref 0.0–0.2)

## 2020-06-20 LAB — RENAL FUNCTION PANEL
Albumin: 1.7 g/dL — ABNORMAL LOW (ref 3.5–5.0)
Anion gap: 18 — ABNORMAL HIGH (ref 5–15)
BUN: 75 mg/dL — ABNORMAL HIGH (ref 8–23)
CO2: 20 mmol/L — ABNORMAL LOW (ref 22–32)
Calcium: 7.4 mg/dL — ABNORMAL LOW (ref 8.9–10.3)
Chloride: 101 mmol/L (ref 98–111)
Creatinine, Ser: 7.47 mg/dL — ABNORMAL HIGH (ref 0.61–1.24)
GFR, Estimated: 7 mL/min — ABNORMAL LOW (ref >60)
Glucose, Bld: 94 mg/dL (ref 70–99)
Phosphorus: 7.3 mg/dL — ABNORMAL HIGH (ref 2.5–4.6)
Potassium: 4.3 mmol/L (ref 3.5–5.1)
Sodium: 139 mmol/L (ref 135–145)

## 2020-06-20 LAB — COMPREHENSIVE METABOLIC PANEL
ALT: 25 U/L (ref 0–44)
AST: 41 U/L (ref 15–41)
Albumin: 1.6 g/dL — ABNORMAL LOW (ref 3.5–5.0)
Alkaline Phosphatase: 52 U/L (ref 38–126)
Anion gap: 19 — ABNORMAL HIGH (ref 5–15)
BUN: 83 mg/dL — ABNORMAL HIGH (ref 8–23)
CO2: 19 mmol/L — ABNORMAL LOW (ref 22–32)
Calcium: 7.2 mg/dL — ABNORMAL LOW (ref 8.9–10.3)
Chloride: 102 mmol/L (ref 98–111)
Creatinine, Ser: 8.54 mg/dL — ABNORMAL HIGH (ref 0.61–1.24)
GFR, Estimated: 6 mL/min — ABNORMAL LOW (ref >60)
Glucose, Bld: 103 mg/dL — ABNORMAL HIGH (ref 70–99)
Potassium: 4.3 mmol/L (ref 3.5–5.1)
Sodium: 140 mmol/L (ref 135–145)
Total Bilirubin: 0.8 mg/dL (ref 0.3–1.2)
Total Protein: 5 g/dL — ABNORMAL LOW (ref 6.5–8.1)

## 2020-06-20 LAB — GLUCOSE, CAPILLARY
Glucose-Capillary: 128 mg/dL — ABNORMAL HIGH (ref 70–99)
Glucose-Capillary: 73 mg/dL (ref 70–99)
Glucose-Capillary: 80 mg/dL (ref 70–99)
Glucose-Capillary: 88 mg/dL (ref 70–99)
Glucose-Capillary: 89 mg/dL (ref 70–99)
Glucose-Capillary: 98 mg/dL (ref 70–99)

## 2020-06-20 LAB — C-REACTIVE PROTEIN: CRP: 40.3 mg/dL — ABNORMAL HIGH (ref <1.0)

## 2020-06-20 LAB — TROPONIN I (HIGH SENSITIVITY)
Troponin I (High Sensitivity): 254 ng/L (ref <18)
Troponin I (High Sensitivity): 255 ng/L (ref <18)

## 2020-06-20 LAB — MAGNESIUM: Magnesium: 2.7 mg/dL — ABNORMAL HIGH (ref 1.7–2.4)

## 2020-06-20 LAB — PATHOLOGIST SMEAR REVIEW

## 2020-06-20 MED ORDER — RENA-VITE PO TABS
1.0000 | ORAL_TABLET | Freq: Every day | ORAL | Status: DC
  Administered 2020-06-20 – 2020-07-01 (×12): 1 via ORAL
  Filled 2020-06-20 (×12): qty 1

## 2020-06-20 MED ORDER — DEXMEDETOMIDINE HCL IN NACL 400 MCG/100ML IV SOLN
0.2000 ug/kg/h | INTRAVENOUS | Status: DC
  Administered 2020-06-20 (×2): 0.2 ug/kg/h via INTRAVENOUS
  Administered 2020-06-21: 0.7 ug/kg/h via INTRAVENOUS
  Administered 2020-06-22: 0.3 ug/kg/h via INTRAVENOUS
  Administered 2020-06-23: 0.7 ug/kg/h via INTRAVENOUS
  Administered 2020-06-23: 0.8 ug/kg/h via INTRAVENOUS
  Filled 2020-06-20 (×7): qty 100

## 2020-06-20 MED ORDER — THIAMINE HCL 100 MG/ML IJ SOLN
100.0000 mg | Freq: Every day | INTRAMUSCULAR | Status: DC
  Administered 2020-06-20 – 2020-06-21 (×2): 100 mg via INTRAVENOUS
  Filled 2020-06-20 (×3): qty 2

## 2020-06-20 MED ORDER — BOOST / RESOURCE BREEZE PO LIQD CUSTOM
1.0000 | Freq: Three times a day (TID) | ORAL | Status: DC
  Administered 2020-06-20 – 2020-06-26 (×11): 1 via ORAL
  Filled 2020-06-20 (×4): qty 1

## 2020-06-20 MED ORDER — MIDAZOLAM HCL 2 MG/2ML IJ SOLN
1.0000 mg | INTRAMUSCULAR | Status: DC | PRN
  Administered 2020-06-23: 1 mg via INTRAVENOUS
  Filled 2020-06-20: qty 2

## 2020-06-20 MED ORDER — FOLIC ACID 5 MG/ML IJ SOLN
1.0000 mg | Freq: Every day | INTRAMUSCULAR | Status: DC
  Administered 2020-06-20 – 2020-06-21 (×2): 1 mg via INTRAVENOUS
  Filled 2020-06-20 (×2): qty 0.2

## 2020-06-20 NOTE — Progress Notes (Signed)
CC: Rectal bleeding, epigastric pain, shortness of breath, admitted 06/16/2020  Subjective: Patient is in bed on dialysis.  He is pretty confused in mittens.  He cannot tell the date, year, or where he is.  On exam he denies any abdominal pain, or any place else.  He has bowel sounds.  He says he has had a bowel movement bowel not sure I believe him.  Objective: Vital signs in last 24 hours: Temp:  [97.6 F (36.4 C)-98.7 F (37.1 C)] 98.4 F (36.9 C) (01/13 0324) Pulse Rate:  [74-96] 96 (01/13 0600) Resp:  [18-33] 30 (01/13 0600) BP: (80-160)/(63-146) 80/63 (01/13 0600) SpO2:  [85 %-100 %] 96 % (01/13 0600) FiO2 (%):  [70 %-100 %] 70 % (01/12 1900) Weight:  [64 kg-66.2 kg] 66.2 kg (01/13 0500) Last BM Date: 06/18/20 450 p.o. 1180 IV Urine zero Dialysis 2232 Afebrile respiratory rate 20s-thirty BP 80/63-187/96 Sats 96 to 100% on high flow nasal cannula Creatinine down to 7.47 CRP 40.3 WBC 10.7 H/H 8.1/23.6 Platelets 103,000  Intake/Output from previous day: 01/12 0701 - 01/13 0700 In: 1630.3 [P.O.:450; I.V.:660.3; IV Piggyback:520] Out: 2232  Intake/Output this shift: No intake/output data recorded.  General appearance: alert, cooperative, no distress and Confused, not oriented to time or place, mittens in place, pleasant and cooperative. Resp: Clear anterior on high flow nasal cannula. GI: Patient's soft, he is nontender.  He denies any pain on palpation throughout his abdomen.  Positive bowel sounds.  Lab Results:  Recent Labs    06/19/20 0042 06/19/20 0322 06/20/20 0457  WBC 7.5  --  10.7*  HGB 9.2* 8.8* 8.1*  HCT 28.4* 26.0* 23.6*  PLT 128*  --  103*    BMET Recent Labs    06/20/20 0214 06/20/20 0456  NA 140 139  K 4.3 4.3  CL 102 101  CO2 19* 20*  GLUCOSE 103* 94  BUN 83* 75*  CREATININE 8.54* 7.47*  CALCIUM 7.2* 7.4*   PT/INR No results for input(s): LABPROT, INR in the last 72 hours.  Recent Labs  Lab 06/16/20 1223 06/17/20 1046  06/18/20 0343 06/19/20 0042 06/19/20 1600 06/20/20 0214 06/20/20 0456  AST 15 23 27 26   --  41  --   ALT 11 14 18 21   --  25  --   ALKPHOS 37* 36* 46 41  --  52  --   BILITOT 0.2* 0.6 0.4 0.5  --  0.8  --   PROT 5.0* 4.8* 4.7* 4.6*  --  5.0*  --   ALBUMIN 2.2* 2.0* 1.8* 1.8* 1.7* 1.6* 1.7*     Lipase  No results found for: LIPASE   Medications: . sodium chloride   Intravenous Once  . amLODipine  10 mg Oral Daily  . Chlorhexidine Gluconate Cloth  6 each Topical Q0600  . Chlorhexidine Gluconate Cloth  6 each Topical Q0600  . darbepoetin (ARANESP) injection - DIALYSIS  60 mcg Intravenous Q Wed-HD  . dexamethasone  6 mg Oral Q24H  . feeding supplement (NEPRO CARB STEADY)  237 mL Oral BID BM  . influenza vaccine adjuvanted  0.5 mL Intramuscular Tomorrow-1000  . Ipratropium-Albuterol  1 puff Inhalation Q6H  . metoprolol tartrate  25 mg Oral BID  . mupirocin ointment  1 application Nasal BID  . pneumococcal 23 valent vaccine  0.5 mL Intramuscular Tomorrow-1000  . polyethylene glycol  17 g Oral Daily  . sevelamer carbonate  1,600 mg Oral TID WC  . sodium zirconium cyclosilicate  10 g Oral Once    Assessment/Plan COVID-19 pneumonia Severe respiratory distress Severe diffuse atherosclerotic peripheral vascular disease End-stage renal disease - AV fistula creation 03/28/20 - not developed left arm -Creatinine 17.14>>19.01>>7.47  - K+ 4.3  - dialysis 1/12 COPD/Hx tobacco use Hx PFO with atrial septal aneurysm - ECHO 07/18/19 EF 60-65%, LVH, grade II diastolic dysfunction, normal RV, RVSP 37.5 mm/Hg, mild RV regurgitation, LA dilatation, AV normal, Hx [name] Hypertension Anemia - H/H 8.4/28.2 >> 7.8/23.9(1/11)>>8.1/23.6 Thrombocytopenia  - 103K    Abdominal pain, uncertain etiology with diffuse vascular disease  - lactate 1.2>>1.1>>1.0>>1.4  - WBC 10.5>>12.0>>19.5>>15.8>>7.5>>10.7  FEN: NPO PO:LIDCVUDTHY 1/11>> day 3 DVT: add SCD's Follow up: TBD  Plan: Patient  denies any abdominal discomfort at all today.  We will start him on clears and see how it does.  Dr. Scot Dock plans arteriogram tomorrow.      LOS: 4 days    Ravenna Legore 06/20/2020 Please see Amion

## 2020-06-20 NOTE — Progress Notes (Signed)
Updated pt's wife Bethena Roys at length via phone. All questions answered.   Nickolas Madrid, NP Pulmonary/Critical Care Medicine  06/20/2020  11:56 AM

## 2020-06-20 NOTE — H&P (View-Only) (Signed)
   VASCULAR SURGERY ASSESSMENT & PLAN:   CHRONIC MULTILEVEL ARTERIAL OCCLUSIVE DISEASE: This patient has significant aortoiliac occlusive disease and severe infrainguinal arterial occlusive disease bilaterally. The peripheral vascular lab did Doppler studies yesterday which confirmed blood flow on the left side but only a venous signal on the right. The patient is high risk for limb loss on the right but I think it is worth proceeding with arteriography. Revascularization would likely require both an inflow procedure and an infrainguinal bypass. Given his renal failure and COVID-pneumonia he would be at high risk for any surgery. There is a small chance would find something we could do for an endovascular standpoint to help some with the blood flow on the right. I have discussed this with the patient. We discussed the indications of the procedure and potential complications and he is agreeable to proceed.  His arteriogram is scheduled for tomorrow.  ESRD: Now that he is on dialysis I plan on using contrast tomorrow unless nephrology has any objection to this.   SUBJECTIVE:   Complains of some pain in the right foot.  PHYSICAL EXAM:   Vitals:   06/20/20 0324 06/20/20 0400 06/20/20 0500 06/20/20 0600  BP:  (!) 157/113  (!) 80/63  Pulse:  94 93 96  Resp:  (!) 24 18 (!) 30  Temp: 98.4 F (36.9 C)     TempSrc: Axillary     SpO2:  99% 98% 96%  Weight:   66.2 kg   Height:       No Doppler signals right foot. Decreased motor function right foot.  The left foot appears adequately perfused.   LABS:     Lab Results  Component Value Date   WBC 10.7 (H) 06/20/2020   HGB 8.1 (L) 06/20/2020   HCT 23.6 (L) 06/20/2020   MCV 88.4 06/20/2020   PLT 103 (L) 06/20/2020   Lab Results  Component Value Date   CREATININE 7.47 (H) 06/20/2020   Lab Results  Component Value Date   INR 1.2 06/16/2020   CBG (last 3)  Recent Labs    06/19/20 2137 06/19/20 2347 06/20/20 0307  GLUCAP 108* 109*  89    PROBLEM LIST:    Active Problems:   COVID-19   Acute renal failure superimposed on stage 5 chronic kidney disease, not on chronic dialysis (HCC)   CURRENT MEDS:   . sodium chloride   Intravenous Once  . amLODipine  10 mg Oral Daily  . Chlorhexidine Gluconate Cloth  6 each Topical Q0600  . Chlorhexidine Gluconate Cloth  6 each Topical Q0600  . darbepoetin (ARANESP) injection - DIALYSIS  60 mcg Intravenous Q Wed-HD  . dexamethasone  6 mg Oral Q24H  . feeding supplement (NEPRO CARB STEADY)  237 mL Oral BID BM  . influenza vaccine adjuvanted  0.5 mL Intramuscular Tomorrow-1000  . Ipratropium-Albuterol  1 puff Inhalation Q6H  . metoprolol tartrate  25 mg Oral BID  . mupirocin ointment  1 application Nasal BID  . pneumococcal 23 valent vaccine  0.5 mL Intramuscular Tomorrow-1000  . polyethylene glycol  17 g Oral Daily  . sevelamer carbonate  1,600 mg Oral TID WC  . sodium zirconium cyclosilicate  10 g Oral Once    [name] Office: [phone] 06/20/2020

## 2020-06-20 NOTE — Progress Notes (Signed)
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 CRRT started 1/12 urgently given worsening respiratory status and timing. Remains hemodynamically stable -c/w CRRT for now -all 2k bags, targeting net neg 50-100cc/hr to start with, can go up to net neg 100-200cc/hr if needed -appreciate ccm's assistance with access placement. RIJ temp line placed 1/12 - Check strict ins/outs -CLIP once more stable and out of ICU  # Acute hypoxic respiratory failure - Secondary to covid PNA  -per primary service: remdesivir+steroids  # Covid 19 PNA   - per primary service - optimize volume status   # HTN  - BP acceptable, expecting drop/improvement with ultrafiltration  # Anemia of CKD  - transfuse prn for hgb <7 -concern for gib w/ brbpr, surg consulted  # Metabolic acidosis (+ag) 2/2 advanced CKD - improving w/ crrt  # Secondary hyperparathyroidism  - PTH was 201 on 06/10/20. Phos >30 -renvela, dose increased -significantly improved  #Hyperkalemia -CRRT, all 2k bags. K improved  Arterial occlusive disease -vvs on board, arteriogram 1/14  Recommendations conveyed to the primary service.  [name], MD [facility]  Subjective:   Due to the COVID pandemic, in attempts to limit transmission and over-utilization of resources (e.g. PPE), the patient was seen virtually today by means of chart review, discussion with staff, and virtual discussion with patient as needed. Tolerating crrt, net neg ~500cc. Agitation overnight. Hemodynamically stable.   Objective:   BP (!) 155/86   Pulse 100   Temp 98.2 F (36.8 C) (Axillary)   Resp (!) 21   Ht 5\' 8"  (1.727 m)   Wt 66.2 kg   SpO2 99%   BMI 22.19 kg/m   Intake/Output Summary (Last 24 hours) at 06/20/2020 1158 Last data filed at 06/20/2020 1100 Gross per 24 hour  Intake 1090.25 ml  Output 2588 ml  Net  -1497.75 ml   Weight change: 0.3 kg  Physical Exam: Not examined, see above  Imaging: DG CHEST PORT 1 VIEW  Result Date: 06/20/2020 CLINICAL DATA:  Hypoxia EXAM: PORTABLE CHEST 1 VIEW COMPARISON:  Radiograph 06/19/2020, CT 06/18/2020 FINDINGS: Dual lumen central venous catheter tip terminates in the mid SVC. Telemetry leads and external support devices overlie the chest Some slight interval clearing of the diffuse multifocal mixed interstitial and patchy airspace opacities throughout both lungs in a mid to lower lung predominance. No pneumothorax or visible effusion the portion of the right costophrenic sulcus is collimated. Stable cardiomegaly. No acute osseous or soft tissue abnormality. Degenerative changes are present in the imaged spine and shoulders. IMPRESSION: 1. Slight interval improvement of the diffuse multifocal opacities throughout both lungs could reflect improving infection or edema. 2. Stable cardiomegaly. 3. Lines and tubes as above. 4.  Aortic Atherosclerosis (ICD10-I70.0). Electronically Signed   By: Lovena Le M.D.   On: 06/20/2020 05:00   DG CHEST PORT 1 VIEW  Result Date: 06/19/2020 CLINICAL DATA:  Central line placement.  COVID-19 positive. EXAM: PORTABLE CHEST 1 VIEW COMPARISON:  June 18, 2020. FINDINGS: Stable cardiomegaly. No pneumothorax or pleural effusion is noted. Interval placement of right internal jugular catheter with distal tip in expected position of the SVC. Stable bilateral lung opacities are noted consistent with multifocal pneumonia. Bony thorax is unremarkable. IMPRESSION: Interval placement of right internal jugular catheter with distal tip in expected position of the SVC. No pneumothorax is noted. Electronically Signed  By: Marijo Conception M.D.   On: 06/19/2020 09:30   DG Chest Port 1 View  Result Date: 06/18/2020 CLINICAL DATA:  Dyspnea.  COVID positive EXAM: PORTABLE CHEST 1 VIEW COMPARISON:  06/16/2020 FINDINGS: Improving bilateral airspace  disease predominantly in the bases. Cardiac enlargement without heart failure.  No effusion. IMPRESSION: Improving bilateral airspace disease Electronically Signed   By: Franchot Gallo M.D.   On: 06/18/2020 18:58   VAS US DUPLEX DIALYSIS ACCESS (AVF, AVG)  Result Date: 06/19/2020 DIALYSIS ACCESS Reason for Exam: Covid positive. Access Site: Left Upper Extremity. Access Type: Brachial-cephalic AVF. History: Hypertension, hyperlipidemia, stroke, GERD, COPD, PVD, tobacco abuse. Comparison Study: 05/14/20 Performing Technologist: Oda Cogan RDMS, RVT  Examination Guidelines: A complete evaluation includes B-mode imaging, spectral Doppler, color Doppler, and power Doppler as needed of all accessible portions of each vessel. Unilateral testing is considered an integral part of a complete examination. Limited examinations for reoccurring indications may be performed as noted.  Findings: +--------------------+----------+-----------------+--------+ AVF                 PSV (cm/s)Flow Vol (mL/min)Comments +--------------------+----------+-----------------+--------+ Native artery inflow   273                              +--------------------+----------+-----------------+--------+ AVF Anastomosis        509                              +--------------------+----------+-----------------+--------+  +------------+----------+-------------+----------+--------+ OUTFLOW VEINPSV (cm/s)Diameter (cm)Depth (cm)Describe +------------+----------+-------------+----------+--------+ Shoulder       240        0.48        0.18            +------------+----------+-------------+----------+--------+ Prox UA                   0.33        0.18            +------------+----------+-------------+----------+--------+ Mid UA         155        0.41        0.30            +------------+----------+-------------+----------+--------+ Dist UA        132        0.45        0.45             +------------+----------+-------------+----------+--------+ AC Fossa       731        0.50        0.31            +------------+----------+-------------+----------+--------+  Documented absent flow in the right PTA and pero and significantly dampened flow in the DP. Left monophasic flow in the ATA and pero.  Summary: Patent left arteriovenous fistula.  *See table(s) above for measurements and observations.  Diagnosing physician: Curt Jews MD Electronically signed by Curt Jews MD on 06/19/2020 at 2:41:17 PM.    --------------------------------------------------------------------------------   Final (Updated)     Labs: BMET Recent Labs  Lab 06/16/20 1223 06/16/20 1547 06/17/20 1046 06/18/20 0343 06/18/20 1836 06/19/20 0042 06/19/20 0322 06/19/20 1600 06/20/20 0214 06/20/20 0456  NA 137  --  136 136 135 138 135 140 140 139  K 4.6  --  4.9 5.8* 5.9* 5.4* 5.2* 4.4 4.3 4.3  CL 108  --  105 105  --  104  --  102 102 101  CO2 10*  --  <7* 7*  --  8*  --  16* 19* 20*  GLUCOSE 102*  --  126* 107*  --  114*  --  124* 103* 94  BUN 86*  --  113* 131*  --  155*  --  113* 83* 75*  CREATININE 15.93* 15.82* 16.72* 17.14*  --  19.01*  --  12.34* 8.54* 7.47*  CALCIUM 7.7*  --  7.4* 6.9*  --  7.0*  --  7.0* 7.2* 7.4*  PHOS  --   --  >30.0*  --   --   --   --  >30.0*  --  7.3*   CBC Recent Labs  Lab 06/17/20 1046 06/18/20 0343 06/18/20 1836 06/19/20 0042 06/19/20 0322 06/20/20 0457  WBC 19.5* 15.8*  --  7.5  --  10.7*  NEUTROABS 17.9* 15.6*  --  6.8  --  8.8*  HGB 8.4* 7.8* 9.5* 9.2* 8.8* 8.1*  HCT 28.2* 23.9* 28.0* 28.4* 26.0* 23.6*  MCV 102.2* 95.6  --  93.7  --  88.4  PLT 164 165  --  128*  --  103*    Medications:    . sodium chloride   Intravenous Once  . amLODipine  10 mg Oral Daily  . Chlorhexidine Gluconate Cloth  6 each Topical Q0600  . Chlorhexidine Gluconate Cloth  6 each Topical Q0600  . darbepoetin (ARANESP) injection - DIALYSIS  60 mcg Intravenous Q Wed-HD  .  dexamethasone  6 mg Oral Q24H  . feeding supplement (NEPRO CARB STEADY)  237 mL Oral BID BM  . folic acid  1 mg Intravenous Daily  . influenza vaccine adjuvanted  0.5 mL Intramuscular Tomorrow-1000  . Ipratropium-Albuterol  1 puff Inhalation Q6H  . metoprolol tartrate  25 mg Oral BID  . mupirocin ointment  1 application Nasal BID  . pneumococcal 23 valent vaccine  0.5 mL Intramuscular Tomorrow-1000  . polyethylene glycol  17 g Oral Daily  . sevelamer carbonate  1,600 mg Oral TID WC  . thiamine  100 mg Intravenous Daily      [name], MD [facility] 06/20/2020, 11:58 AM

## 2020-06-20 NOTE — Progress Notes (Signed)
Initial Nutrition Assessment  DOCUMENTATION CODES:   Not applicable  INTERVENTION:   Boost Breeze po TID, each supplement provides 250 kcal and 9 grams of protein, while on CL diet  Nepro Shake po BID, each supplement provides 425 kcal and 19 grams protein, once diet advanced  Add Renal MVI daily   NUTRITION DIAGNOSIS:   Increased nutrient needs related to acute illness as evidenced by estimated needs.  GOAL:   Patient will meet greater than or equal to 90% of their needs  MONITOR:   Diet advancement,Supplement acceptance,PO intake,Labs,Weight trends  REASON FOR ASSESSMENT:   Malnutrition Screening Tool    ASSESSMENT:   65 yo male admitted with acute respiratory failure secondary to COVID pneumonia, CKD V with progression to ESRD with initiation of RRT. PMH includes PVD, CHF, CKD, HTN, COPD   1/09 Admitted 1/12 CRRT initiated  Noted pt some delerium and agitation requiring precedex drip  Arterial occlusive disease with infiltrated AVF. Noted plan for arteriogram tomorrow  Abd pain and GIB; Ct with pelvic free fluid not no clear signs of infection or ischemia. No large volume of bleeding.  Diet advanced to CL, surgery has signed off. Diet to be advanced as tolerated. No recorded po intake  Secondary hyperparathyroidism with PTH 102, Phosphorus >30 which has improved on RRT  Labs: phosphorus 7.3 (H) Meds: aransep, decadron, miralax, renvela, thiamine, sodium bicarb at 75   Diet Order:   Diet Order            Diet NPO time specified  Diet effective midnight           Diet clear liquid Room service appropriate? Yes; Fluid consistency: Thin  Diet effective now                 EDUCATION NEEDS:   Not appropriate for education at this time  Skin:  Skin Assessment: Reviewed RN Assessment  Last BM:  1/11  Height:   Ht Readings from Last 1 Encounters:  06/16/20 5\' 8"  (1.727 m)    Weight:   Wt Readings from Last 1 Encounters:  06/20/20 66.2 kg     BMI:  Body mass index is 22.19 kg/m.  Estimated Nutritional Needs:   Kcal:  1900-2200 kcals  Protein:  95-120 g  Fluid:  1000 mL plus UOP    Kerman Passey MS, RDN, LDN, CNSC Registered Dietitian III Clinical Nutrition RD Pager and On-Call Pager Number Located in [place]

## 2020-06-20 NOTE — Progress Notes (Signed)
NAME:  Albert Harris, MRN:  [id number], DOB:  10-12-1955, LOS: 4 ADMISSION DATE:  06/16/2020, CONSULTATION DATE:  06/18/20 REFERRING MD:  FMTS, CHIEF COMPLAINT:  Decompensation -- Hypoxia    Brief History:  65 yo M + COVID, bacteremia, CKD V, HFpEF admitted to [place], who on 1/11 has had increasing O2 req, from RA to 6L (SpO2 94-99% on 6L)  Initially eval by PCCM - rec SDU admit and urgent HD. Unable to receive HD due to AVF infiltration. Moved to ICU 1/12 AM for HD placement and ongoing care  History of Present Illness:  65 yo M PMH HFpEF, CKD V, HTN, [name], PVD, COPD admitted to [facility] 1/19 for SOB. Found to be COVID-19 positive. CXR 1/9 with bilateral opacities, possibly pulm edema. In ED, Cr 15 -- plan to start [facility] 1/11. On 1/11 pt with hgb drop from 8.4 to 7.8, for which 1 PRBC was ordered and approx 1/2 unit was completed.  Pt is planned to start iHD 1/11, however this has not yet occurred.On 1/11 pt also with new O2 requirement -- now on 6LNC from RA, with resultant SpO2 94-99%. BCx have resulted with H Flu. Pt also c/o new severe abdominal pain -- CTA c/a/p obtained and reveal bilateral pulm opacities, multiple atherosclerotic occlusion and high grade stenoses, small volume of pelvic free fluid, diverticulosis.   VVS, CCS, and PCCM consulted in this setting.   Past Medical History:  PVD HFpEF CKDV HTN COPD Tobacco use disorder  GERD  Significant Hospital Events:  1/9 admitted to [facility], covid +. Presenting Cr 15.  1/11 BCx H.Flu. Hgb 7.8, 1 PRBC ordered. Plan to start iHd, however has not occurred. New O2 requirement. New severe abdominal pain. PCCM, CCS, VVS consulted for hypoxia, abdominal pain + fluid collection, and atherosclerotic occlusions respectively. 1/12 couldn't get HD overnight due to infiltrated fistula. Moved to ICU with progressive AGMA in this setting as will need temp HD cath + iHD. HD cath placed R IJ. Nephro plans for HD, possible CRRT   Consults:   Nephrology CCS VVS PCCM   Procedures:  1/12 R IJ HD cath>>   Significant Diagnostic Tests:  1/9 CXR> bilateral pulmonary opacities - PNA vs edema 1/11 CTA c/a/p- no PE. Bilat pulm opacities. Multiple atherosclerotic occlusions and high-grade occlusions: R SFA occlusion, bilat internal iliac artery occulsion, bilat external iliac artery severe stenosis, progressed severe stenosis of R vertebral artery, moderate-severe stenosis proximal SMA. Small volume of pelvic free fluid. Diverticulosis 1/12 CXR- interval placement of R IJ HD cath, tip in SVC. Stable bilateral pulmonary opacities.  Micro Data:  1/9 COVID-19 positive 1/9 BCx> H flu, beta lactamase negative  1/9 UCx>  1/11 Tracheal aspirate>>  1/12 MRSA PCR> pos  Antimicrobials:  Ampicillin 1/11>  COVID therapies: remdes 1/10> Decadron 1/9>   Interim History / Subjective:  Remains on CVVHD - pulling 78ml/hr  Increased agitation, confusion overnight. RN concerned about ST elevation   Objective   Blood pressure (!) 158/97, pulse (!) 101, temperature 98.2 F (36.8 C), temperature source Axillary, resp. rate (!) 23, height 5\' 8"  (1.727 m), weight 66.2 kg, SpO2 97 %.    FiO2 (%):  [70 %-100 %] 70 %   Intake/Output Summary (Last 24 hours) at 06/20/2020 0941 Last data filed at 06/20/2020 0900 Gross per 24 hour  Intake 1500.21 ml  Output 2418 ml  Net -917.79 ml   Filed Weights   06/19/20 0300 06/19/20 0800 06/20/20 0500  Weight: 63.7 kg 64 kg  66.2 kg    Examination:  General: Acutely and chronically ill appearing adult M, appears older than stated age. NAD, just had fentanyl push per RN  HENT: NCAT pink mmm Anicteric sclera. Trachea midline  Lungs: resps even non labored on 6L [place], few exp wheezes. Symmetrical chest expansion, no accessory use on [place]  Cardiovascular: RRR s1s2 no rgm + JVD  Abdomen: Soft, tender to light palpation- diffuse over all quadrants. Intermittent guarding.   Extremities: No obvious joint  deformity. BLE mottled.  Neuro: AAOx4 following commands. Intermittent involuntary extremity twitching. PERRL 56mm  GU: condom cath  Skin: Mottled lower extremities. Cool distal BUE BLE extremities. Scattered petechiae over abdomen, R flank bruise  Resolved Hospital Problem list     Assessment & Plan:   Acute hypoxemic respiratory failure Acute hypoxic respiratory failure, multifactorial due to volume overload/pulm edema in setting of renal failure, COVID-19 PNA  P - Supplemental O2 to keep SpO2 > 90% -volume removal with CVVH - Remdesivir and steroids  - f/u tracheal aspirate  - pulmonary hygiene as able   AGMA  -suspect multifactorial -- largely driven by renal failure, bacteremia as below P CRRT per renal   H flu bacteremia - Ampicillin  - Follow cultures per sensitivities  CKD V, with progression to ESRD, uremia AVF infiltrated 1/11 overnight, couldn't get HD  BUN 155 Hyperkalemia - moved to ICU for acidemia, incr RR in this setting, need for HD cath placement -RIJ temp cath placed 1/12.  -CRRT per renal as above    Hypocalcemia -replace. Will also help temporize hyperkalemia   Abdominal pain: CT demonstrates pelvic free fluid but no clear signs of infection or ischemia - CCS following  GIB patient with BRBPR reported at home. FOBT in ED negative. Intermittent rectal bleeding since presentation, not a large volume - Transfused one-half unit PRBC 1/11 P Surgery following  Trend hgb  Clear liq diet    Arterial occlusive disease Infiltrated AVF:  - VVS following - chronic changes  - arteriogram planned 1/14  Delirium, agitation - likely exacerbated by steroids, icu delirium +/- ETOH withdrawal PLAN-  precedex gtt  PRN fentayl  Monitor closely, avoid oversedation  Thiamine, folate   ?ST elevation - denies chest pain  PLAN -  EKG  Troponin     Best practice (evaluated daily)  Diet: NPO Pain/Anxiety/Delirium protocol (if indicated): Fentanyl per  primary VAP protocol (if indicated): NA DVT prophylaxis: SQH GI prophylaxis: Per primary Glucose control: Per primary Mobility: Bedrest Disposition: ICU  Goals of Care:   Last date of multidisciplinary goals of care discussion: Per primary Family and staff present:  Summary of discussion:  Follow up goals of care discussion due: 1/18 Code Status: FULL Family update -- wife updated 1/12. Patient updated at bedside as well  Labs   CBC: Recent Labs  Lab 06/16/20 1547 06/17/20 1046 06/18/20 0343 06/18/20 1836 06/19/20 0042 06/19/20 0322 06/20/20 0457  WBC 12.0* 19.5* 15.8*  --  7.5  --  10.7*  NEUTROABS  --  17.9* 15.6*  --  6.8  --  8.8*  HGB 8.0* 8.4* 7.8* 9.5* 9.2* 8.8* 8.1*  HCT 25.2* 28.2* 23.9* 28.0* 28.4* 26.0* 23.6*  MCV 99.2 102.2* 95.6  --  93.7  --  88.4  PLT 146* 164 165  --  128*  --  103*    Basic Metabolic Panel: Recent Labs  Lab 06/17/20 1046 06/18/20 0343 06/18/20 1836 06/19/20 0042 06/19/20 0322 06/19/20 1600 06/20/20 0214 06/20/20 0456  NA 136 136   < > 138 135 140 140 139  K 4.9 5.8*   < > 5.4* 5.2* 4.4 4.3 4.3  CL 105 105  --  104  --  102 102 101  CO2 <7* 7*  --  8*  --  16* 19* 20*  GLUCOSE 126* 107*  --  114*  --  124* 103* 94  BUN 113* 131*  --  155*  --  113* 83* 75*  CREATININE 16.72* 17.14*  --  19.01*  --  12.34* 8.54* 7.47*  CALCIUM 7.4* 6.9*  --  7.0*  --  7.0* 7.2* 7.4*  MG  --   --   --   --   --   --  2.7*  --   PHOS >30.0*  --   --   --   --  >30.0*  --  7.3*   < > = values in this interval not displayed.   GFR: Estimated Creatinine Clearance: 9.1 mL/min (A) (by C-G formula based on SCr of 7.47 mg/dL (H)). Recent Labs  Lab 06/16/20 1404 06/16/20 1547 06/16/20 1647 06/17/20 1046 06/18/20 0343 06/18/20 1912 06/19/20 0042 06/20/20 0457  PROCALCITON 44.75  --   --   --   --   --   --   --   WBC  --    < >  --  19.5* 15.8*  --  7.5 10.7*  LATICACIDVEN 1.2  --  1.1  --   --  1.0 1.4  --    < > = values in this interval  not displayed.    Liver Function Tests: Recent Labs  Lab 06/16/20 1223 06/17/20 1046 06/18/20 0343 06/19/20 0042 06/19/20 1600 06/20/20 0214 06/20/20 0456  AST 15 23 27 26   --  41  --   ALT 11 14 18 21   --  25  --   ALKPHOS 37* 36* 46 41  --  52  --   BILITOT 0.2* 0.6 0.4 0.5  --  0.8  --   PROT 5.0* 4.8* 4.7* 4.6*  --  5.0*  --   ALBUMIN 2.2* 2.0* 1.8* 1.8* 1.7* 1.6* 1.7*   No results for input(s): LIPASE, AMYLASE in the last 168 hours. No results for input(s): AMMONIA in the last 168 hours.  ABG    Component Value Date/Time   PHART 7.202 (L) 06/19/2020 0322   PCO2ART 21.6 (L) 06/19/2020 0322   PO2ART 71 (L) 06/19/2020 0322   HCO3 8.6 (L) 06/19/2020 0322   TCO2 9 (L) 06/19/2020 0322   ACIDBASEDEF 18.0 (H) 06/19/2020 0322   O2SAT 92.0 06/19/2020 0322     Coagulation Profile: Recent Labs  Lab 06/16/20 1404  INR 1.2    Cardiac Enzymes: No results for input(s): CKTOTAL, CKMB, CKMBINDEX, TROPONINI in the last 168 hours.  HbA1C: Hgb A1c MFr Bld  Date/Time Value Ref Range Status  11/10/2017 07:21 AM 5.4 4.8 - 5.6 % Final    Comment:    (NOTE) Pre diabetes:          5.7%-6.4% Diabetes:              >6.4% Glycemic control for   <7.0% adults with diabetes   12/25/2016 03:50 AM 5.4 4.8 - 5.6 % Final    Comment:    (NOTE)         Pre-diabetes: 5.7 - 6.4         Diabetes: >6.4  Glycemic control for adults with diabetes: <7.0     CBG: Recent Labs  Lab 06/19/20 0259 06/19/20 2137 06/19/20 2347 06/20/20 0307 06/20/20 0806  GLUCAP 108* 108* 109* 89 73      CRITICAL CARE Performed by: Darlina Sicilian   Total critical care time: 32 minutes  Critical care time was exclusive of separately billable procedures and treating other patients. Critical care was necessary to treat or prevent imminent or life-threatening deterioration.  Critical care was time spent personally by me on the following activities: development of treatment plan with  patient and/or surrogate as well as nursing, discussions with consultants, evaluation of patient's response to treatment, examination of patient, obtaining history from patient or surrogate, ordering and performing treatments and interventions, ordering and review of laboratory studies, ordering and review of radiographic studies, pulse oximetry and re-evaluation of patient's condition.  Nickolas Madrid, NP Pulmonary/Critical Care Medicine  06/20/2020  9:41 AM

## 2020-06-20 NOTE — Progress Notes (Signed)
Patient complaining of sever "cramping" pain in his lower right foot and is sensitive to touch. Dr. Donnetta Hutching was made aware. No orders given.

## 2020-06-20 NOTE — Progress Notes (Addendum)
   VASCULAR SURGERY ASSESSMENT & PLAN:   CHRONIC MULTILEVEL ARTERIAL OCCLUSIVE DISEASE: This patient has significant aortoiliac occlusive disease and severe infrainguinal arterial occlusive disease bilaterally. The peripheral vascular lab did Doppler studies yesterday which confirmed blood flow on the left side but only a venous signal on the right. The patient is high risk for limb loss on the right but I think it is worth proceeding with arteriography. Revascularization would likely require both an inflow procedure and an infrainguinal bypass. Given his renal failure and COVID-pneumonia he would be at high risk for any surgery. There is a small chance would find something we could do for an endovascular standpoint to help some with the blood flow on the right. I have discussed this with the patient. We discussed the indications of the procedure and potential complications and he is agreeable to proceed.  His arteriogram is scheduled for tomorrow.  ESRD: Now that he is on dialysis I plan on using contrast tomorrow unless nephrology has any objection to this.   SUBJECTIVE:   Complains of some pain in the right foot.  PHYSICAL EXAM:   Vitals:   06/20/20 0324 06/20/20 0400 06/20/20 0500 06/20/20 0600  BP:  (!) 157/113  (!) 80/63  Pulse:  94 93 96  Resp:  (!) 24 18 (!) 30  Temp: 98.4 F (36.9 C)     TempSrc: Axillary     SpO2:  99% 98% 96%  Weight:   66.2 kg   Height:       No Doppler signals right foot. Decreased motor function right foot.  The left foot appears adequately perfused.   LABS:     Lab Results  Component Value Date   WBC 10.7 (H) 06/20/2020   HGB 8.1 (L) 06/20/2020   HCT 23.6 (L) 06/20/2020   MCV 88.4 06/20/2020   PLT 103 (L) 06/20/2020   Lab Results  Component Value Date   CREATININE 7.47 (H) 06/20/2020   Lab Results  Component Value Date   INR 1.2 06/16/2020   CBG (last 3)  Recent Labs    06/19/20 2137 06/19/20 2347 06/20/20 0307  GLUCAP 108* 109*  89    PROBLEM LIST:    Active Problems:   COVID-19   Acute renal failure superimposed on stage 5 chronic kidney disease, not on chronic dialysis (HCC)   CURRENT MEDS:   . sodium chloride   Intravenous Once  . amLODipine  10 mg Oral Daily  . Chlorhexidine Gluconate Cloth  6 each Topical Q0600  . Chlorhexidine Gluconate Cloth  6 each Topical Q0600  . darbepoetin (ARANESP) injection - DIALYSIS  60 mcg Intravenous Q Wed-HD  . dexamethasone  6 mg Oral Q24H  . feeding supplement (NEPRO CARB STEADY)  237 mL Oral BID BM  . influenza vaccine adjuvanted  0.5 mL Intramuscular Tomorrow-1000  . Ipratropium-Albuterol  1 puff Inhalation Q6H  . metoprolol tartrate  25 mg Oral BID  . mupirocin ointment  1 application Nasal BID  . pneumococcal 23 valent vaccine  0.5 mL Intramuscular Tomorrow-1000  . polyethylene glycol  17 g Oral Daily  . sevelamer carbonate  1,600 mg Oral TID WC  . sodium zirconium cyclosilicate  10 g Oral Once    [name] Office: [phone] 06/20/2020

## 2020-06-21 ENCOUNTER — Encounter (HOSPITAL_COMMUNITY): Payer: Self-pay | Admitting: Vascular Surgery

## 2020-06-21 ENCOUNTER — Encounter (HOSPITAL_COMMUNITY)
Admission: EM | Disposition: A | Discharge status: Home Health | Payer: Self-pay | Source: Home / Self Care | Attending: Family Medicine

## 2020-06-21 DIAGNOSIS — U071 COVID-19: Secondary | ICD-10-CM | POA: Diagnosis not present

## 2020-06-21 DIAGNOSIS — J069 Acute upper respiratory infection, unspecified: Secondary | ICD-10-CM | POA: Diagnosis not present

## 2020-06-21 DIAGNOSIS — N179 Acute kidney failure, unspecified: Secondary | ICD-10-CM | POA: Diagnosis not present

## 2020-06-21 DIAGNOSIS — N185 Chronic kidney disease, stage 5: Secondary | ICD-10-CM | POA: Diagnosis not present

## 2020-06-21 HISTORY — PX: ABDOMINAL AORTOGRAM W/LOWER EXTREMITY: CATH118223

## 2020-06-21 HISTORY — PX: PERIPHERAL VASCULAR INTERVENTION: CATH118257

## 2020-06-21 LAB — COMPREHENSIVE METABOLIC PANEL
ALT: 26 U/L (ref 0–44)
AST: 66 U/L — ABNORMAL HIGH (ref 15–41)
Albumin: 1.5 g/dL — ABNORMAL LOW (ref 3.5–5.0)
Alkaline Phosphatase: 54 U/L (ref 38–126)
Anion gap: 14 (ref 5–15)
BUN: 36 mg/dL — ABNORMAL HIGH (ref 8–23)
CO2: 22 mmol/L (ref 22–32)
Calcium: 7.7 mg/dL — ABNORMAL LOW (ref 8.9–10.3)
Chloride: 102 mmol/L (ref 98–111)
Creatinine, Ser: 3.38 mg/dL — ABNORMAL HIGH (ref 0.61–1.24)
GFR, Estimated: 19 mL/min — ABNORMAL LOW (ref >60)
Glucose, Bld: 103 mg/dL — ABNORMAL HIGH (ref 70–99)
Potassium: 3.6 mmol/L (ref 3.5–5.1)
Sodium: 138 mmol/L (ref 135–145)
Total Bilirubin: 0.9 mg/dL (ref 0.3–1.2)
Total Protein: 4.9 g/dL — ABNORMAL LOW (ref 6.5–8.1)

## 2020-06-21 LAB — CBC WITH DIFFERENTIAL/PLATELET
Abs Immature Granulocytes: 0.04 10*3/uL (ref 0.00–0.07)
Basophils Absolute: 0 10*3/uL (ref 0.0–0.1)
Basophils Relative: 0 %
Eosinophils Absolute: 0 10*3/uL (ref 0.0–0.5)
Eosinophils Relative: 0 %
HCT: 23.1 % — ABNORMAL LOW (ref 39.0–52.0)
Hemoglobin: 8.1 g/dL — ABNORMAL LOW (ref 13.0–17.0)
Immature Granulocytes: 0 %
Lymphocytes Relative: 3 %
Lymphs Abs: 0.4 10*3/uL — ABNORMAL LOW (ref 0.7–4.0)
MCH: 31.4 pg (ref 26.0–34.0)
MCHC: 35.1 g/dL (ref 30.0–36.0)
MCV: 89.5 fL (ref 80.0–100.0)
Monocytes Absolute: 0.6 10*3/uL (ref 0.1–1.0)
Monocytes Relative: 5 %
Neutro Abs: 12.3 10*3/uL — ABNORMAL HIGH (ref 1.7–7.7)
Neutrophils Relative %: 92 %
Platelets: 91 10*3/uL — ABNORMAL LOW (ref 150–400)
RBC: 2.58 MIL/uL — ABNORMAL LOW (ref 4.22–5.81)
RDW: 16.1 % — ABNORMAL HIGH (ref 11.5–15.5)
WBC: 13.3 10*3/uL — ABNORMAL HIGH (ref 4.0–10.5)
nRBC: 0.3 % — ABNORMAL HIGH (ref 0.0–0.2)

## 2020-06-21 LAB — RENAL FUNCTION PANEL
Albumin: 1.6 g/dL — ABNORMAL LOW (ref 3.5–5.0)
Albumin: 1.5 g/dL — ABNORMAL LOW (ref 3.5–5.0)
Anion gap: 12 (ref 5–15)
Anion gap: 16 — ABNORMAL HIGH (ref 5–15)
BUN: 50 mg/dL — ABNORMAL HIGH (ref 8–23)
BUN: 42 mg/dL — ABNORMAL HIGH (ref 8–23)
CO2: 23 mmol/L (ref 22–32)
CO2: 21 mmol/L — ABNORMAL LOW (ref 22–32)
Calcium: 7.1 mg/dL — ABNORMAL LOW (ref 8.9–10.3)
Calcium: 7.1 mg/dL — ABNORMAL LOW (ref 8.9–10.3)
Chloride: 103 mmol/L (ref 98–111)
Chloride: 102 mmol/L (ref 98–111)
Creatinine, Ser: 4.78 mg/dL — ABNORMAL HIGH (ref 0.61–1.24)
Creatinine, Ser: 3.55 mg/dL — ABNORMAL HIGH (ref 0.61–1.24)
GFR, Estimated: 13 mL/min — ABNORMAL LOW (ref >60)
GFR, Estimated: 18 mL/min — ABNORMAL LOW (ref >60)
Glucose, Bld: 104 mg/dL — ABNORMAL HIGH (ref 70–99)
Glucose, Bld: 95 mg/dL (ref 70–99)
Phosphorus: 6.9 mg/dL — ABNORMAL HIGH (ref 2.5–4.6)
Phosphorus: 4.5 mg/dL (ref 2.5–4.6)
Potassium: 3.9 mmol/L (ref 3.5–5.1)
Potassium: 3.3 mmol/L — ABNORMAL LOW (ref 3.5–5.1)
Sodium: 138 mmol/L (ref 135–145)
Sodium: 139 mmol/L (ref 135–145)

## 2020-06-21 LAB — MAGNESIUM: Magnesium: 2.8 mg/dL — ABNORMAL HIGH (ref 1.7–2.4)

## 2020-06-21 LAB — PHOSPHORUS: Phosphorus: 4.4 mg/dL (ref 2.5–4.6)

## 2020-06-21 LAB — GLUCOSE, CAPILLARY
Glucose-Capillary: 106 mg/dL — ABNORMAL HIGH (ref 70–99)
Glucose-Capillary: 131 mg/dL — ABNORMAL HIGH (ref 70–99)
Glucose-Capillary: 149 mg/dL — ABNORMAL HIGH (ref 70–99)
Glucose-Capillary: 158 mg/dL — ABNORMAL HIGH (ref 70–99)
Glucose-Capillary: 176 mg/dL — ABNORMAL HIGH (ref 70–99)
Glucose-Capillary: 40 mg/dL — CL (ref 70–99)
Glucose-Capillary: 59 mg/dL — ABNORMAL LOW (ref 70–99)
Glucose-Capillary: 79 mg/dL (ref 70–99)

## 2020-06-21 LAB — POCT ACTIVATED CLOTTING TIME
Activated Clotting Time: 303 seconds
Activated Clotting Time: 267 seconds

## 2020-06-21 LAB — C-REACTIVE PROTEIN: CRP: 35.1 mg/dL — ABNORMAL HIGH (ref <1.0)

## 2020-06-21 SURGERY — ABDOMINAL AORTOGRAM W/LOWER EXTREMITY
Anesthesia: LOCAL | Laterality: Right

## 2020-06-21 MED ORDER — ONDANSETRON HCL 4 MG/2ML IJ SOLN: 4.0000 mg | Freq: Four times a day (QID) | INTRAMUSCULAR | Status: DC | PRN

## 2020-06-21 MED ORDER — FENTANYL CITRATE (PF) 100 MCG/2ML IJ SOLN
INTRAMUSCULAR | Status: AC
  Filled 2020-06-21: qty 2

## 2020-06-21 MED ORDER — ACETAMINOPHEN 325 MG PO TABS: 650.0000 mg | ORAL_TABLET | ORAL | Status: DC | PRN

## 2020-06-21 MED ORDER — THIAMINE HCL 100 MG PO TABS
100.0000 mg | ORAL_TABLET | Freq: Every day | ORAL | Status: DC
  Administered 2020-06-22 – 2020-06-25 (×4): 100 mg via ORAL
  Filled 2020-06-21 (×4): qty 1

## 2020-06-21 MED ORDER — PRISMASOL BGK 4/2.5 32-4-2.5 MEQ/L EC SOLN
Status: DC
  Filled 2020-06-21 (×25): qty 5000

## 2020-06-21 MED ORDER — MIDAZOLAM HCL 2 MG/2ML IJ SOLN
INTRAMUSCULAR | Status: DC | PRN
  Administered 2020-06-21 (×3): 1 mg via INTRAVENOUS

## 2020-06-21 MED ORDER — IPRATROPIUM BROMIDE HFA 17 MCG/ACT IN AERS
2.0000 | INHALATION_SPRAY | Freq: Four times a day (QID) | RESPIRATORY_TRACT | Status: DC
  Administered 2020-06-21 – 2020-06-25 (×16): 2 via RESPIRATORY_TRACT
  Filled 2020-06-21: qty 12.9

## 2020-06-21 MED ORDER — MIDAZOLAM HCL 2 MG/2ML IJ SOLN
INTRAMUSCULAR | Status: AC
  Filled 2020-06-21: qty 2

## 2020-06-21 MED ORDER — HEPARIN (PORCINE) IN NACL 1000-0.9 UT/500ML-% IV SOLN
INTRAVENOUS | Status: DC | PRN
  Administered 2020-06-21 (×2): 500 mL

## 2020-06-21 MED ORDER — HEPARIN (PORCINE) IN NACL 1000-0.9 UT/500ML-% IV SOLN
INTRAVENOUS | Status: AC
  Filled 2020-06-21: qty 1000

## 2020-06-21 MED ORDER — HEPARIN SODIUM (PORCINE) 1000 UNIT/ML IJ SOLN
INTRAMUSCULAR | Status: AC
  Filled 2020-06-21: qty 1

## 2020-06-21 MED ORDER — LABETALOL HCL 5 MG/ML IV SOLN: 10.0000 mg | INTRAVENOUS | Status: DC | PRN

## 2020-06-21 MED ORDER — ATORVASTATIN CALCIUM 10 MG PO TABS
10.0000 mg | ORAL_TABLET | Freq: Every day | ORAL | Status: DC
  Administered 2020-06-21 – 2020-06-24 (×4): 10 mg via ORAL
  Filled 2020-06-21 (×4): qty 1

## 2020-06-21 MED ORDER — SODIUM CHLORIDE 0.9 % IV SOLN: 250.0000 mL | INTRAVENOUS | Status: DC | PRN

## 2020-06-21 MED ORDER — ASPIRIN EC 81 MG PO TBEC
81.0000 mg | DELAYED_RELEASE_TABLET | Freq: Every day | ORAL | Status: DC
  Administered 2020-06-22 – 2020-07-02 (×11): 81 mg via ORAL
  Filled 2020-06-21 (×11): qty 1

## 2020-06-21 MED ORDER — FOLIC ACID 1 MG PO TABS
1.0000 mg | ORAL_TABLET | Freq: Every day | ORAL | Status: DC
  Administered 2020-06-22 – 2020-06-25 (×4): 1 mg via ORAL
  Filled 2020-06-21 (×4): qty 1

## 2020-06-21 MED ORDER — SODIUM CHLORIDE 0.9% FLUSH
3.0000 mL | Freq: Two times a day (BID) | INTRAVENOUS | Status: DC
  Administered 2020-06-21 – 2020-06-27 (×11): 3 mL via INTRAVENOUS

## 2020-06-21 MED ORDER — ALBUTEROL SULFATE HFA 108 (90 BASE) MCG/ACT IN AERS
1.0000 | INHALATION_SPRAY | Freq: Four times a day (QID) | RESPIRATORY_TRACT | Status: DC
  Administered 2020-06-21 – 2020-06-25 (×14): 2 via RESPIRATORY_TRACT
  Administered 2020-06-25: 1 via RESPIRATORY_TRACT
  Administered 2020-06-25: 2 via RESPIRATORY_TRACT
  Filled 2020-06-21: qty 6.7

## 2020-06-21 MED ORDER — HEPARIN SODIUM (PORCINE) 1000 UNIT/ML IJ SOLN
INTRAMUSCULAR | Status: DC | PRN
  Administered 2020-06-21: 7000 [IU] via INTRAVENOUS

## 2020-06-21 MED ORDER — HYDRALAZINE HCL 20 MG/ML IJ SOLN
20.0000 mg | INTRAMUSCULAR | Status: DC | PRN
  Administered 2020-06-21 – 2020-06-22 (×2): 20 mg via INTRAVENOUS
  Filled 2020-06-21 (×2): qty 1

## 2020-06-21 MED ORDER — LIDOCAINE HCL (PF) 1 % IJ SOLN
INTRAMUSCULAR | Status: AC
  Filled 2020-06-21: qty 30

## 2020-06-21 MED ORDER — DEXTROSE 50 % IV SOLN
25.0000 g | INTRAVENOUS | Status: AC
  Administered 2020-06-21: 25 g via INTRAVENOUS

## 2020-06-21 MED ORDER — LIDOCAINE HCL (PF) 1 % IJ SOLN
INTRAMUSCULAR | Status: DC | PRN
  Administered 2020-06-21: 15 mL via INTRADERMAL

## 2020-06-21 MED ORDER — FENTANYL CITRATE (PF) 100 MCG/2ML IJ SOLN
INTRAMUSCULAR | Status: DC | PRN
  Administered 2020-06-21 (×3): 50 ug via INTRAVENOUS

## 2020-06-21 MED ORDER — HYDRALAZINE HCL 20 MG/ML IJ SOLN
5.0000 mg | INTRAMUSCULAR | Status: DC | PRN
  Administered 2020-06-22: 5 mg via INTRAVENOUS
  Filled 2020-06-21: qty 1

## 2020-06-21 MED ORDER — SODIUM CHLORIDE 0.9% FLUSH: 3.0000 mL | INTRAVENOUS | Status: DC | PRN

## 2020-06-21 MED ORDER — DEXTROSE 50 % IV SOLN
INTRAVENOUS | Status: AC
  Filled 2020-06-21: qty 50

## 2020-06-21 MED ORDER — IODIXANOL 320 MG/ML IV SOLN
INTRAVENOUS | Status: DC | PRN
  Administered 2020-06-21: 200 mL via INTRA_ARTERIAL

## 2020-06-21 SURGICAL SUPPLY — 29 items
BALLN STERLING OTW 4X100X135 (BALLOONS) ×3
BALLN STERLING OTW 6X60X135 (BALLOONS) ×3
BALLOON STERLING OTW 4X100X135 (BALLOONS) IMPLANT
BALLOON STERLING OTW 6X60X135 (BALLOONS) IMPLANT
CATH ANGIO 5F PIGTAIL 65CM (CATHETERS) ×1 IMPLANT
CATH CROSS OVER TEMPO 5F (CATHETERS) ×1 IMPLANT
CATH NAVICROSS ANG 65CM (CATHETERS) IMPLANT
CATH QUICKCROSS SUPP .035X90CM (MICROCATHETER) ×1 IMPLANT
CATH STRAIGHT 5FR 65CM (CATHETERS) ×1 IMPLANT
CATHETER NAVICROSS ANG 65CM (CATHETERS) ×3
DEVICE CONTINUOUS FLUSH (MISCELLANEOUS) ×1 IMPLANT
DEVICE TORQUE .025-.038 (MISCELLANEOUS) ×1 IMPLANT
GUIDEWIRE ANGLED .035X150CM (WIRE) ×1 IMPLANT
KIT ENCORE 26 ADVANTAGE (KITS) ×1 IMPLANT
KIT MICROPUNCTURE NIT STIFF (SHEATH) ×1 IMPLANT
KIT PV (KITS) ×3 IMPLANT
SHEATH PINNACLE 5F 10CM (SHEATH) ×1 IMPLANT
SHEATH PINNACLE MP 7F 45CM (SHEATH) ×1 IMPLANT
SHEATH PROBE COVER 6X72 (BAG) ×1 IMPLANT
STENT VIABAHN 6X50X120 (Permanent Stent) ×2 IMPLANT
STENT VIABAHN 7X39X80 VBX (Permanent Stent) ×1 IMPLANT
STENT VIABAHN 8X39X80 VBX (Permanent Stent) ×1 IMPLANT
SYR MEDRAD MARK V 150ML (SYRINGE) ×1 IMPLANT
TRANSDUCER W/STOPCOCK (MISCELLANEOUS) ×3 IMPLANT
TRAY PV CATH (CUSTOM PROCEDURE TRAY) ×3 IMPLANT
WIRE BENTSON .035X145CM (WIRE) ×1 IMPLANT
WIRE G V18X300CM (WIRE) ×1 IMPLANT
WIRE ROSEN-J .035X260CM (WIRE) ×1 IMPLANT
WIRE TORQFLEX AUST .018X40CM (WIRE) ×1 IMPLANT

## 2020-06-21 NOTE — Progress Notes (Signed)
eLink Physician-Brief Progress Note Patient Name: HARTWELL VANDIVER DOB: [birth year] MRN: [id number]   Date of Service  06/21/2020  HPI/Events of Note  Patient is still restless despite reaching Precedex ceiling of 0.7 mcg.  eICU Interventions  Precedex ceiling increased to 1.0 mcg.        Kerry Kass Mea Ozga 06/21/2020, 9:04 PM

## 2020-06-21 NOTE — Progress Notes (Signed)
74fr sheath aspirated and removed from LFA. Manual pressure applied for 30 minutes. Site level 0 no S+S of hematoma. Pressure dressing applied. Patient sleeping.   Distal right pt present with doppler, left pt questionable present with doppler. Bilateral dp pulses not detected with doppler.   Bedrest begins at 13:45:00

## 2020-06-21 NOTE — Progress Notes (Signed)
Pt w/ CBG 40, tx w/ hypoglycemia protocol, f/u CBG 158.  Dr. Scot Dock updated to assessment of pulses by myself and cath lab staff. No new order, not unexpected, will continue to monitor.

## 2020-06-21 NOTE — Progress Notes (Addendum)
NAME:  Albert Harris, MRN:  [id number], DOB:  03/08/1956, LOS: 66 ADMISSION DATE:  06/16/2020, CONSULTATION DATE:  06/18/20 REFERRING MD:  FMTS, CHIEF COMPLAINT:  Decompensation -- Hypoxia    Brief History:  65 yo M + COVID, bacteremia, CKD V, HFpEF admitted to [place], who on 1/11 has had increasing O2 req, from RA to 6L (SpO2 94-99% on 6L)  Initially eval by PCCM - rec SDU admit and urgent HD. Unable to receive HD due to AVF infiltration. Moved to ICU 1/12 AM for HD placement and ongoing care  History of Present Illness:  65 yo M PMH HFpEF, CKD V, HTN, [name], PVD, COPD admitted to [facility] 1/19 for SOB. Found to be COVID-19 positive. CXR 1/9 with bilateral opacities, possibly pulm edema. In ED, Cr 15 -- plan to start [facility] 1/11. On 1/11 pt with hgb drop from 8.4 to 7.8, for which 1 PRBC was ordered and approx 1/2 unit was completed.  Pt is planned to start iHD 1/11, however this has not yet occurred.On 1/11 pt also with new O2 requirement -- now on 6LNC from RA, with resultant SpO2 94-99%. BCx have resulted with H Flu. Pt also c/o new severe abdominal pain -- CTA c/a/p obtained and reveal bilateral pulm opacities, multiple atherosclerotic occlusion and high grade stenoses, small volume of pelvic free fluid, diverticulosis.   VVS, CCS, and PCCM consulted in this setting.   Of note, patient is a former smoker, quit 07/17/2019 with a 50 pack year smoking history  Past Medical History:  PVD HFpEF CKDV HTN COPD Tobacco use disorder  GERD  Significant Hospital Events:  1/9 admitted to [place], covid +. Presenting Cr 15.  1/11 BCx H.Flu. Hgb 7.8, 1 PRBC ordered. Plan to start iHd, however has not occurred. New O2 requirement. New severe abdominal pain. PCCM, CCS, VVS consulted for hypoxia, abdominal pain + fluid collection, and atherosclerotic occlusions respectively. 1/12 couldn't get HD overnight due to infiltrated fistula. Moved to ICU with progressive AGMA in this setting as will need temp HD cath + iHD.  HD cath placed R IJ. Nephro plans for HD, possible CRRT   Consults:  Nephrology CCS VVS PCCM   Procedures:  1/12 R IJ HD cath>>   Significant Diagnostic Tests:  1/9 CXR> bilateral pulmonary opacities - PNA vs edema 1/11 CTA c/a/p- no PE. Bilat pulm opacities. Multiple atherosclerotic occlusions and high-grade occlusions: R SFA occlusion, bilat internal iliac artery occulsion, bilat external iliac artery severe stenosis, progressed severe stenosis of R vertebral artery, moderate-severe stenosis proximal SMA. Small volume of pelvic free fluid. Diverticulosis 1/12 CXR- interval placement of R IJ HD cath, tip in SVC. Stable bilateral pulmonary opacities.   06/21/2020 Arteriography with intervention Ultrasound-guided access to the left common femoral artery Aortogram with bilateral iliac arteriogram and bilateral lower extremity runoff Selective catheterization of the right external iliac artery (second order catheterization) Angioplasty and stenting of the right external iliac artery (6 X 5 Viabahn X2) Angioplasty and stenting of the right common iliac artery (7 X 39 VBX) Angioplasty and stenting of the left common iliac artery (8 X 39 VBX) Micro Data:  1/9 COVID-19 positive 1/9 BCx> H flu, beta lactamase negative  1/9 UCx>  1/11 Tracheal aspirate>>  1/12 MRSA PCR> pos  Antimicrobials:  Ampicillin 1/11>  COVID therapies: remdes 1/10> Decadron 1/9>   Interim History / Subjective:  Off CVVHD since 1/14 am Creatinine 3.38 CRP 35.1 Been to cath lab>> Angioplasty and stenting of the right external iliac  artery (6 X 5 Viabahn X2) Angioplasty and stenting of the right common iliac artery (7 X 63 VBX) Angioplasty and stenting of the left common iliac artery (8 X 39 VBX) Remains on Precedex at 0.3 Awake and alert Some pain to BLE Net negative 1 L Objective   Blood pressure (!) 152/78, pulse 80, temperature 98.7 F (37.1 C), temperature source Oral, resp. rate 16, height 5\' 8"   (1.727 m), weight 65.7 kg, SpO2 100 %.        Intake/Output Summary (Last 24 hours) at 06/21/2020 1235 Last data filed at 06/21/2020 0700 Gross per 24 hour  Intake 694.39 ml  Output 1978 ml  Net -1283.61 ml   Filed Weights   06/19/20 0800 06/20/20 0500 06/21/20 0500  Weight: 64 kg 66.2 kg 65.7 kg    Examination:  General: Acutely and chronically ill appearing adult M, appears older than stated age. Mild distress as just returned from vasc procedure HENT: NCAT pink mmm Anicteric sclera. Trachea midline, No LAD, No JVD  Lungs: Bilateral chest excursion,resps even non labored , sats of 93% on RA,  + exp wheezes. Symmetrical chest expansion, no accessory use on RA  Cardiovascular: S1, S2, RRR s1s2 no rgm + JVD  Abdomen: Soft, tender to light palpation- diffuse over all quadrants. Intermittent guarding, BS diminished.   Extremities: No obvious joint deformity. BLE mottled.+ Post Tib per doppler, less mottling, warm to touch, cap refill > 3 seconds  Neuro: AAOx4 following commands. Intermittent involuntary extremity twitching. PERRL 68mm  GU: condom cath  Skin: Scant Mottling  lower extremities. Warm  distal BUE BLE extremities. Scattered petechiae over abdomen, R flank bruise  Resolved Hospital Problem list     Assessment & Plan:   Acute hypoxemic respiratory failure Increase in WBC overnight ( 10.7>> 13.3) Afebrile Acute hypoxic respiratory failure, multifactorial due to volume overload/pulm edema in setting of renal failure, COVID-19 PNA  P - Supplemental O2 to keep SpO2 > 90% - volume removal with CVVH - Remdesivir and steroids  - f/u tracheal aspirate >> not yet collected - f/u on blood Cx ( 06/18/2020)>>  - aggressive pulmonary hygiene as able , IS - Mobilize as able per vasc surgery - Trend WBC and fever Curve  AGMA  -suspect multifactorial -- largely driven by renal failure, bacteremia as below P CRRT per renal  Trend BMET daily  H flu bacteremia - Ampicillin  -  Follow cultures per sensitivities  CKD V, 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  - CRRT stopped 1/14 - maintain renal perfusion - Avoid nephrotoxic medications - Trend BMET daily - HD Per renal   Anemia of critical illness / ESRD Plan - transfuse prn for hgb <7 -concern for gib w/ brbpr, surg consulted   Hypocalcemia -replace. Will also help temporize hyperkalemia Check ionized calcium 1/15    Abdominal pain: CT demonstrates pelvic free fluid but no clear signs of infection or ischemia - CCS following  GIB patient with BRBPR reported at home. FOBT in ED negative. Intermittent rectal bleeding since presentation, not a large volume - Transfused one-half unit PRBC 1/11 P Surgery following  Trend hgb  Clear liq diet  Transfuse for HGB < 7   Arterial occlusive disease Infiltrated AVF ABDOMINAL AORTOGRAM W/LOWER  EXTREMITY (N/A) as a surgical intervention ( stent placement) 1/14 - VVS following - intervention 1/14   Delirium, agitation - likely exacerbated by steroids, icu delirium +/- ETOH withdrawal PLAN-  precedex gtt  PRN fentayl  Monitor closely, avoid oversedation  Thiamine, folate   ?ST elevation - denies chest pain  PLAN -  Trend EKG  Trend Troponins >> elevated 254, 255 on 1/13 in setting of ESRD Will watch for now ( Discussed with Dr. Valeta Harms)  Former smoker  Quit 2021 with a 50 pack year smoking history Bronchospasm/ wheezing noted on exam 1/14 Plan Will change Albuterol inhaler to scheduled Q 6  Will d/c Combivent Will add Atrovent inhaler Q 6 scheduled Ongoing smoking abstinence counseling    Best practice (evaluated daily)  Diet: NPO Pain/Anxiety/Delirium protocol (if indicated): Fentanyl per  primary VAP protocol (if indicated): NA DVT prophylaxis: SQH GI prophylaxis: Per primary Glucose control: Per primary Mobility: Bedrest Disposition: ICU  Goals of Care:   Last date of multidisciplinary goals of care discussion: Per primary Family and staff present:  Summary of discussion:  Follow up goals of care discussion due: 1/18 Code Status: FULL Family update -- wife updated 1/12. Patient updated at bedside 1/14   Labs   CBC: Recent Labs  Lab 06/17/20 1046 06/18/20 0343 06/18/20 1836 06/19/20 0042 06/19/20 0322 06/20/20 0457 06/21/20 0446  WBC 19.5* 15.8*  --  7.5  --  10.7* 13.3*  NEUTROABS 17.9* 15.6*  --  6.8  --  8.8* 12.3*  HGB 8.4* 7.8* 9.5* 9.2* 8.8* 8.1* 8.1*  HCT 28.2* 23.9* 28.0* 28.4* 26.0* 23.6* 23.1*  MCV 102.2* 95.6  --  93.7  --  88.4 89.5  PLT 164 165  --  128*  --  103* 91*    Basic Metabolic Panel: Recent Labs  Lab 06/17/20 1046 06/18/20 0343 06/19/20 1600 06/20/20 0214 06/20/20 0456 06/20/20 1600 06/21/20 0446  NA 136   < > 140 140 139 138 138  K 4.9   < > 4.4 4.3 4.3 3.9 3.6  CL 105   < > 102 102 101 103 102  CO2 <7*   < > 16* 19* 20* 23 22  GLUCOSE 126*   < > 124* 103* 94 104* 103*  BUN 113*   < > 113* 83* 75* 50* 36*  CREATININE 16.72*   < > 12.34* 8.54* 7.47* 4.78* 3.38*  CALCIUM 7.4*   < > 7.0* 7.2* 7.4* 7.1* 7.7*  MG  --   --   --  2.7*  --   --  2.8*  PHOS >30.0*  --  >30.0*  --  7.3* 6.9* 4.4   < > = values in this interval not displayed.   GFR: Estimated Creatinine Clearance: 20 mL/min (A) (by C-G formula based on SCr of 3.38 mg/dL (H)). Recent Labs  Lab 06/16/20 1404 06/16/20 1547 06/16/20 1647 06/17/20 1046 06/18/20 0343 06/18/20 1912 06/19/20 0042 06/20/20 0457 06/21/20 0446  PROCALCITON 44.75  --   --   --   --   --   --   --   --   WBC  --    < >  --    < > 15.8*  --  7.5 10.7* 13.3*  LATICACIDVEN 1.2  --  1.1  --   --  1.0 1.4  --   --    < > = values in this interval not displayed.    Liver Function  Tests: Recent Labs  Lab  06/17/20 1046 06/18/20 0343 06/19/20 0042 06/19/20 1600 06/20/20 0214 06/20/20 0456 06/20/20 1600 06/21/20 0446  AST 23 27 26   --  41  --   --  66*  ALT 14 18 21   --  25  --   --  26  ALKPHOS 36* 46 41  --  52  --   --  54  BILITOT 0.6 0.4 0.5  --  0.8  --   --  0.9  PROT 4.8* 4.7* 4.6*  --  5.0*  --   --  4.9*  ALBUMIN 2.0* 1.8* 1.8* 1.7* 1.6* 1.7* 1.6* 1.5*   No results for input(s): LIPASE, AMYLASE in the last 168 hours. No results for input(s): AMMONIA in the last 168 hours.  ABG    Component Value Date/Time   PHART 7.202 (L) 06/19/2020 0322   PCO2ART 21.6 (L) 06/19/2020 0322   PO2ART 71 (L) 06/19/2020 0322   HCO3 8.6 (L) 06/19/2020 0322   TCO2 9 (L) 06/19/2020 0322   ACIDBASEDEF 18.0 (H) 06/19/2020 0322   O2SAT 92.0 06/19/2020 0322     Coagulation Profile: Recent Labs  Lab 06/16/20 1404  INR 1.2    Cardiac Enzymes: No results for input(s): CKTOTAL, CKMB, CKMBINDEX, TROPONINI in the last 168 hours.  HbA1C: Hgb A1c MFr Bld  Date/Time Value Ref Range Status  11/10/2017 07:21 AM 5.4 4.8 - 5.6 % Final    Comment:    (NOTE) Pre diabetes:          5.7%-6.4% Diabetes:              >6.4% Glycemic control for   <7.0% adults with diabetes   12/25/2016 03:50 AM 5.4 4.8 - 5.6 % Final    Comment:    (NOTE)         Pre-diabetes: 5.7 - 6.4         Diabetes: >6.4         Glycemic control for adults with diabetes: <7.0     CBG: Recent Labs  Lab 06/20/20 2016 06/20/20 2350 06/21/20 0426 06/21/20 1223 06/21/20 1225  GLUCAP 128* 80 106* 59* 40*      CRITICAL CARE Performed by: Magdalen Spatz   Total critical care time: 35 minutes  Critical care time was exclusive of separately billable procedures and treating other patients. Critical care was necessary to treat or prevent imminent or life-threatening deterioration.  Critical care was time spent personally by me on the following activities: development of treatment plan with  patient and/or surrogate as well as nursing, discussions with consultants, evaluation of patient's response to treatment, examination of patient, obtaining history from patient or surrogate, ordering and performing treatments and interventions, ordering and review of laboratory studies, ordering and review of radiographic studies, pulse oximetry and re-evaluation of patient's condition.  Magdalen Spatz, MSN, AGACNP-BC [place] for personal pager 06/21/2020  12:35 PM    PCCM:   65 yo M, Covid+, HFpEF, bacteremic, vascular angio this morning with stenting placed.  BP (!) 178/84   Pulse 91   Temp 98.7 F (37.1 C) (Oral)   Resp (!) 23   Ht 5\' 8"  (1.727 m)   Wt 65.7 kg   SpO2 92%   BMI 22.02 kg/m  Gen: elderly male, alert, following commands  HENT: tracking  Heart: RRR, s1 s2 Lungs: CTAB no wheeze  Labs reviewed   A:  AHRF H flu bactermia  Volume overload  AGMA PAD s/p stenting  ESRD GIB  Arterial occlusive disease  AVF Former smoker   P: Weaning FIO2 as tolerated  O2 goal >90%  cvvhd per nephrology  remdesivir and steroids Ampicillin  Follow H&H   This patient is critically ill with multiple organ system failure; which, requires frequent high complexity decision making, assessment, support, evaluation, and titration of therapies. This was completed through the application of advanced monitoring technologies and extensive interpretation of multiple databases. During this encounter critical care time was devoted to patient care services described in this note for 32 minutes.  [facility] 06/21/2020 6:16 PM

## 2020-06-21 NOTE — Progress Notes (Signed)
eLink Physician-Brief Progress Note Patient Name: KAHIAU SCHEWE DOB: [birth year] MRN: [id number]   Date of Service  06/21/2020  HPI/Events of Note  Notified of hypertension BP 173/72.  eICU Interventions  Hydralazine ordered prn.     Intervention Category Intermediate Interventions: Hypertension - evaluation and management  Elsie Lincoln 06/21/2020, 5:19 AM

## 2020-06-21 NOTE — Interval H&P Note (Signed)
History and Physical Interval Note:  06/21/2020 7:19 AM  Albert Harris  has presented today for surgery, with the diagnosis of PAD.  The various methods of treatment have been discussed with the patient and family. After consideration of risks, benefits and other options for treatment, the patient has consented to  Procedure(s): ABDOMINAL AORTOGRAM W/LOWER EXTREMITY (N/A) as a surgical intervention.  [name] history has been reviewed, patient examined, no change in status, stable for surgery.  I have reviewed the patient's chart and labs.  Questions were answered to the patient's satisfaction.     [name]

## 2020-06-21 NOTE — Progress Notes (Signed)
Report given to transporting RN.

## 2020-06-21 NOTE — Progress Notes (Signed)
Patient's spouse, Albert Harris called back to give consent for Albert Harris's Angiogram procedure after speaking with Dr. Scot Dock. She stated that she understands the risks and benefits of it. Consent witnessed and signed by myself and Sherilyn Dacosta, RN. Consent placed in patient's chart.

## 2020-06-21 NOTE — Progress Notes (Signed)
[facility] Progress Note    Assessment/ Plan:   # CKD stage V--now ESRD  - setting of covid superimposed on advanced CKD . CRRT started 1/12 urgently given worsening respiratory status and timing. Remains hemodynamically stable -c/w CRRT for now -changing dialysate to 4k/2.5cal, labs reviewed -targeting net neg 50-100cc/hr to start with, can go up to net neg 100-200cc/hr if needed -appreciate ccm's assistance with access placement. RIJ temp line placed 1/12 -strict ins/outs -CLIP once more stable and out of ICU  # Acute hypoxic respiratory failure - Secondary to covid PNA  -per primary service: remdesivir+steroids  # Covid 19 PNA   - per primary service - optimizing volume status   # HTN  - BP acceptable, expecting drop/improvement with ultrafiltration  # Anemia of CKD  - transfuse prn for hgb <7 -concern for gib w/ brbpr, surg consulted  # Metabolic acidosis (+ag) 2/2 advanced CKD - improving w/ crrt  # Secondary hyperparathyroidism  - PTH was 201 on 06/10/20. Phos >30 -significantly improved -stop renvela, on crrt  #Hyperkalemia -CRRT, resoved  Arterial occlusive disease -vvs on board, arteriogram 1/14 w/ intervention: anio and stent of right ext iliac artery, right common iliac artery, left common iliac art  Recommendations conveyed to the primary service.  [name], MD [facility]  Subjective:   Due to the COVID pandemic, in attempts to limit transmission and over-utilization of resources (e.g. PPE), the patient was seen virtually today by means of chart review, discussion with staff, and virtual discussion with patient as needed.  -off crrt for OR today. No acute events. Tolerating crrt, hemodynamically stable. Net neg 1.4L   Objective:   BP (!) 175/87   Pulse 92   Temp 98.7 F (37.1 C) (Oral)   Resp (!) 28   Ht 5\' 8"  (1.727 m)   Wt 65.7 kg   SpO2 100%   BMI 22.02 kg/m   Intake/Output Summary (Last 24 hours)  at 06/21/2020 1446 Last data filed at 06/21/2020 0700 Gross per 24 hour  Intake 654.91 ml  Output 1794 ml  Net -1139.09 ml   Weight change: 1.7 kg  Physical Exam: Not examined, see above  Imaging: PERIPHERAL VASCULAR CATHETERIZATION  Result Date: 06/21/2020  PATIENT: Albert Harris                                                               MRN: [id number] DOB: 05-19-1956                                            DATE OF PROCEDURE: 06/21/2020  INDICATIONS:   Albert Harris is a 65 y.o. male who presented with chronic multilevel arterial occlusive disease.  He was critically ill in the ICU with COVID-pneumonia and end-stage renal disease.  He had no Doppler flow in the right foot was having rest pain on the right foot therefore he was at risk for limb loss and I recommended proceeding with arteriography and possible intervention  PROCEDURE:   1. conscious sedation 2.  Ultrasound-guided access to the left common femoral artery 3.  Aortogram with bilateral iliac arteriogram and bilateral lower extremity runoff 4.  Selective catheterization of the right external iliac artery (second order catheterization) 5.  Angioplasty and stenting of the right external iliac artery (6 X 5 Viabahn X2) Angioplasty and stenting of the right common iliac artery (7 X 39 VBX) 6.  Angioplasty and stenting of the left common iliac artery (8 X 39 VBX)  SURGEON: Judeth Cornfield. Scot Dock, MD, FACS  ANESTHESIA: Local with sedation  EBL: Minimal  TECHNIQUE: The patient was brought to the peripheral vascular lab and was sedated. The period of conscious sedation was 126 minutes minutes.  During that time period, I was present face-to-face 100% of the time.  The patient was administered initially 1 mg of Versed and 50 mcg of fentanyl.  He received additional medications during the procedure.. [name] heart rate, blood pressure, and oxygen saturation were monitored by the nurse continuously during the procedure.  Both  groins were prepped and draped in the usual sterile fashion.  Under ultrasound guidance, after the skin was anesthetized, I cannulated the left common femoral artery with a micropuncture needle and a micropuncture sheath was introduced over a wire.  This was exchanged for a 5 [place] sheath over a Bentson wire.  By ultrasound the femoral artery was patent. A real-time image was obtained and sent to the server.  A 5 French sheath was placed on the right and then a pigtail catheter positioned at the L1 vertebral body.  Flush aortogram obtained.  The catheter was in position above the aortic bifurcation and an oblique iliac projection was obtained.  Next bilateral lower extremity runoff films were obtained.  The patient was noted to have significant disease in the right common iliac artery and also external iliac artery.  I elected to address this with angioplasty and stenting.  The pigtail catheter was exchanged for a crossover catheter which was positioned into the proximal right common iliac artery.  An angled Glidewire was advanced into the deep femoral artery and the straight catheter advanced over this and the Glidewire exchanged for a Rosen wire.  I then exchanged the 5 French sheath for a long 7 [place] destination sheath which was positioned over the [place] wire over the bifurcation into the external iliac artery on the right.  The patient was then heparinized.  ACT was monitored throughout the procedure.  I initially selected a 6 x 5 Viabahn stent which was deployed just above the inguinal ligament.  A second overlapping 6 x 5 Viabahn stent was then placed above this to cover the right external iliac artery disease.  Postdilatation was done with a 6 mm balloon and there was no residual stenosis on follow-up film.  I then addressed the right common iliac artery stenosis.  I selected a 7 mm x 39 mm VBX stent.  This was positioned across the stenosis and then the sheath retracted to the level of the bifurcation.   The stent was deployed without difficulty to nominal pressure.  Completion film showed no residual stenosis.  On the left side with a 70% stenosis in the common iliac artery.  I elected to address this with angioplasty and stenting.  I selected an 8 mm x 39 mm VBX stent.  This was positioned across the stenosis and deployed without difficulty.  Of note the hypogastric arteries were occluded bilaterally.  The sheath was then retracted to just above the inguinal ligament.  Patient was transferred for removal of the sheath.  No immediate complications were noted.  FINDINGS:  1. 2 renal arteries on the right  and a single renal artery on the left.  The infrarenal aorta is patent with diffuse calcific disease but no focal stenosis. 2.  On the right side, which was the more symptomatic side, there was an 80% right common iliac artery stenosis and diffuse disease of the right external iliac artery.  These were both addressed with angioplasty and stenting as described above.  Below that there is calcific disease in the common femoral artery and deep femoral artery.  The superficial femoral artery is occluded at its origin with reconstitution of the above-knee popliteal artery.  There is three-vessel runoff on the right although there is diffuse tibial disease in the arteries are small. 3.  On the left side there was a 70% left common iliac artery stenosis which was successfully addressed with angioplasty and stenting.  Of note the hypogastric artery on the left is occluded.  There is moderate disease in the left external iliac artery.  The common femoral and deep femoral artery are patent.  The superficial femoral artery is occluded proximally.  There is reconstitution of the above-knee popliteal artery on the left with two-vessel runoff on the left via the peroneal and anterior tibial artery.  TASC Classification  Largest Sheath Size: 7 [place]  Target vessel: Right common iliac artery, right external iliac artery,  left common iliac artery  % Stenosis: RIGHT Pre 80%. Post 0% for both the common iliac artery and external iliac artery.  LEFT: Pre-70%.  Post 0%.  Lesion length: Right common iliac artery 2 cm.  Left common iliac artery 2 cm.  Right external iliac artery 7 cm.  Calcification: Yes  Most impactful devices used (Up to 3):   6 x 5 Viabahn stent.  7 x 39 VBX stent.  8 x 39 VBX stent  Outflow: Disease present or not distal to the lesion treated and the  Flow in the distal vessel:  yes   [name], MD, FACS [facility] [place] DICTATION:   06/21/2020     DG CHEST PORT 1 VIEW  Result Date: 06/20/2020 CLINICAL DATA:  Hypoxia EXAM: PORTABLE CHEST 1 VIEW COMPARISON:  Radiograph 06/19/2020, CT 06/18/2020 FINDINGS: Dual lumen central venous catheter tip terminates in the mid SVC. Telemetry leads and external support devices overlie the chest Some slight interval clearing of the diffuse multifocal mixed interstitial and patchy airspace opacities throughout both lungs in a mid to lower lung predominance. No pneumothorax or visible effusion the portion of the right costophrenic sulcus is collimated. Stable cardiomegaly. No acute osseous or soft tissue abnormality. Degenerative changes are present in the imaged spine and shoulders. IMPRESSION: 1. Slight interval improvement of the diffuse multifocal opacities throughout both lungs could reflect improving infection or edema. 2. Stable cardiomegaly. 3. Lines and tubes as above. 4.  Aortic Atherosclerosis (ICD10-I70.0). Electronically Signed   By: Lovena Le M.D.   On: 06/20/2020 05:00    Labs: BMET Recent Labs  Lab 06/17/20 1046 06/18/20 0343 06/18/20 1836 06/19/20 0042 06/19/20 0322 06/19/20 1600 06/20/20 0214 06/20/20 0456 06/20/20 1600 06/21/20 0446  NA 136 136   < > 138 135 140 140 139 138 138  K 4.9 5.8*   < > 5.4* 5.2* 4.4 4.3 4.3 3.9 3.6  CL 105 105  --  104  --  102 102 101 103 102  CO2 <7* 7*  --   8*  --  16* 19* 20* 23 22  GLUCOSE 126* 107*  --  114*  --  124* 103* 94 104* 103*  BUN 113* 131*  --  155*  --  113* 83* 75* 50* 36*  CREATININE 16.72* 17.14*  --  19.01*  --  12.34* 8.54* 7.47* 4.78* 3.38*  CALCIUM 7.4* 6.9*  --  7.0*  --  7.0* 7.2* 7.4* 7.1* 7.7*  PHOS >30.0*  --   --   --   --  >30.0*  --  7.3* 6.9* 4.4   < > = values in this interval not displayed.   CBC Recent Labs  Lab 06/18/20 0343 06/18/20 1836 06/19/20 0042 06/19/20 0322 06/20/20 0457 06/21/20 0446  WBC 15.8*  --  7.5  --  10.7* 13.3*  NEUTROABS 15.6*  --  6.8  --  8.8* 12.3*  HGB 7.8*   < > 9.2* 8.8* 8.1* 8.1*  HCT 23.9*   < > 28.4* 26.0* 23.6* 23.1*  MCV 95.6  --  93.7  --  88.4 89.5  PLT 165  --  128*  --  103* 91*   < > = values in this interval not displayed.    Medications:    . sodium chloride   Intravenous Once  . albuterol  1-2 puff Inhalation Q6H  . amLODipine  10 mg Oral Daily  . [START ON 06/22/2020] aspirin EC  81 mg Oral Daily  . atorvastatin  10 mg Oral q1800  . Chlorhexidine Gluconate Cloth  6 each Topical Q0600  . Chlorhexidine Gluconate Cloth  6 each Topical Q0600  . darbepoetin (ARANESP) injection - DIALYSIS  60 mcg Intravenous Q Wed-HD  . dexamethasone  6 mg Oral Q24H  . dextrose      . feeding supplement  1 Container Oral TID BM  . feeding supplement (NEPRO CARB STEADY)  237 mL Oral BID BM  . [START ON 12/07/6376] folic acid  1 mg Oral Daily  . influenza vaccine adjuvanted  0.5 mL Intramuscular Tomorrow-1000  . ipratropium  2 puff Inhalation Q6H  . metoprolol tartrate  25 mg Oral BID  . multivitamin  1 tablet Oral QHS  . mupirocin ointment  1 application Nasal BID  . pneumococcal 23 valent vaccine  0.5 mL Intramuscular Tomorrow-1000  . polyethylene glycol  17 g Oral Daily  . sevelamer carbonate  1,600 mg Oral TID WC  . sodium chloride flush  3 mL Intravenous Q12H  . [START ON 06/22/2020] thiamine  100 mg Oral Daily      [name], MD [facility] 06/21/2020, 2:46 PM

## 2020-06-21 NOTE — Progress Notes (Signed)
Brief progress note Called and spoke with patient's wife.  Gave her a full update. All questions were asked and answered,   Magdalen Spatz, MSN, AGACNP-BC [place] for personal pager 06/21/2020 2:49 PM

## 2020-06-21 NOTE — Op Note (Addendum)
PATIENT: Albert Harris      MRN: [id number] DOB: October 01, 1955    DATE OF PROCEDURE: 06/21/2020  INDICATIONS:    IMRI LOR is a 65 y.o. male who presented with chronic multilevel arterial occlusive disease.  He was critically ill in the ICU with COVID-pneumonia and end-stage renal disease.  He had no Doppler flow in the right foot was having rest pain on the right foot therefore he was at risk for limb loss and I recommended proceeding with arteriography and possible intervention  PROCEDURE:    1. conscious sedation 2.  Ultrasound-guided access to the left common femoral artery 3.  Aortogram with bilateral iliac arteriogram and bilateral lower extremity runoff 4.  Selective catheterization of the right external iliac artery (second order catheterization) 5.  Angioplasty and stenting of the right external iliac artery (6 X 5 Viabahn X2) 6. Angioplasty and stenting of the right common iliac artery (7 X 39 VBX) 7.  Angioplasty and stenting of the left common iliac artery (8 X 39 VBX)  SURGEON: Judeth Cornfield. Scot Dock, MD, FACS  ANESTHESIA: Local with sedation  EBL: Minimal  TECHNIQUE: The patient was brought to the peripheral vascular lab and was sedated. The period of conscious sedation was 126 minutes minutes.  During that time period, I was present face-to-face 100% of the time.  The patient was administered initially 1 mg of Versed and 50 mcg of fentanyl.  He received additional medications during the procedure.. [name] heart rate, blood pressure, and oxygen saturation were monitored by the nurse continuously during the procedure.  Both groins were prepped and draped in the usual sterile fashion.  Under ultrasound guidance, after the skin was anesthetized, I cannulated the left common femoral artery with a micropuncture needle and a micropuncture sheath was introduced over a wire.  This was exchanged for a 5 [place] sheath over a Bentson wire.  By ultrasound the femoral artery was  patent. A real-time image was obtained and sent to the server.  A 5 French sheath was placed on the right and then a pigtail catheter positioned at the L1 vertebral body.  Flush aortogram obtained.  The catheter was in position above the aortic bifurcation and an oblique iliac projection was obtained.  Next bilateral lower extremity runoff films were obtained.  The patient was noted to have significant disease in the right common iliac artery and also external iliac artery.  I elected to address this with angioplasty and stenting.  The pigtail catheter was exchanged for a crossover catheter which was positioned into the proximal right common iliac artery.  An angled Glidewire was advanced into the deep femoral artery and the straight catheter advanced over this and the Glidewire exchanged for a Rosen wire.  I then exchanged the 5 French sheath for a long 7 [place] destination sheath which was positioned over the [place] wire over the bifurcation into the external iliac artery on the right.  The patient was then heparinized.  ACT was monitored throughout the procedure.  I initially selected a 6 x 5 Viabahn stent which was deployed just above the inguinal ligament.  A second overlapping 6 x 5 Viabahn stent was then placed above this to cover the right external iliac artery disease.  Postdilatation was done with a 6 mm balloon and there was no residual stenosis on follow-up film.  I then addressed the right common iliac artery stenosis.  I selected a 7 mm x 39 mm VBX stent.  This was positioned  across the stenosis and then the sheath retracted to the level of the bifurcation.  The stent was deployed without difficulty to nominal pressure.  Completion film showed no residual stenosis.  On the left side with a 70% stenosis in the common iliac artery.  I elected to address this with angioplasty and stenting.  I selected an 8 mm x 39 mm VBX stent.  This was positioned across the stenosis and deployed without  difficulty.  Of note the hypogastric arteries were occluded bilaterally.  The sheath was then retracted to just above the inguinal ligament.  Patient was transferred for removal of the sheath.  No immediate complications were noted.  FINDINGS:   1. 2 renal arteries on the right and a single renal artery on the left.  The infrarenal aorta is patent with diffuse calcific disease but no focal stenosis. 2.  On the right side, which was the more symptomatic side, there was an 80% right common iliac artery stenosis and diffuse disease of the right external iliac artery.  These were both addressed with angioplasty and stenting as described above.  Below that there is calcific disease in the common femoral artery and deep femoral artery.  The superficial femoral artery is occluded at its origin with reconstitution of the above-knee popliteal artery.  There is three-vessel runoff on the right although there is diffuse tibial disease in the arteries are small. 3.  On the left side there was a 70% left common iliac artery stenosis which was successfully addressed with angioplasty and stenting.  Of note the hypogastric artery on the left is occluded.  There is moderate disease in the left external iliac artery.  The common femoral and deep femoral artery are patent.  The superficial femoral artery is occluded proximally.  There is reconstitution of the above-knee popliteal artery on the left with two-vessel runoff on the left via the peroneal and anterior tibial artery.  TASC Classification  Largest Sheath Size: 7 [place]  Target vessel: Right common iliac artery, right external iliac artery, left common iliac artery  % Stenosis: RIGHT Pre 80%. Post 0% for both the common iliac artery and external iliac artery.  LEFT: Pre-70%.  Post 0%.  Lesion length: Right common iliac artery 2 cm.  Left common iliac artery 2 cm.  Right external iliac artery 7 cm.  Calcification: Yes  Most impactful devices used (Up to  3):   6 x 5 Viabahn stent.  7 x 39 VBX stent.  8 x 39 VBX stent  Outflow: Disease present or not distal to the lesion treated and the  Flow in the distal vessel:  yes   [name], MD, FACS [facility] DICTATION:   06/21/2020

## 2020-06-22 ENCOUNTER — Inpatient Hospital Stay (HOSPITAL_COMMUNITY): Payer: Medicare Other

## 2020-06-22 DIAGNOSIS — J069 Acute upper respiratory infection, unspecified: Secondary | ICD-10-CM | POA: Diagnosis not present

## 2020-06-22 DIAGNOSIS — U071 COVID-19: Secondary | ICD-10-CM | POA: Diagnosis not present

## 2020-06-22 LAB — COMPREHENSIVE METABOLIC PANEL
ALT: 24 U/L (ref 0–44)
AST: 49 U/L — ABNORMAL HIGH (ref 15–41)
Albumin: 1.5 g/dL — ABNORMAL LOW (ref 3.5–5.0)
Alkaline Phosphatase: 63 U/L (ref 38–126)
Anion gap: 11 (ref 5–15)
BUN: 32 mg/dL — ABNORMAL HIGH (ref 8–23)
CO2: 25 mmol/L (ref 22–32)
Calcium: 7.6 mg/dL — ABNORMAL LOW (ref 8.9–10.3)
Chloride: 101 mmol/L (ref 98–111)
Creatinine, Ser: 2.52 mg/dL — ABNORMAL HIGH (ref 0.61–1.24)
GFR, Estimated: 27 mL/min — ABNORMAL LOW (ref >60)
Glucose, Bld: 155 mg/dL — ABNORMAL HIGH (ref 70–99)
Potassium: 3.5 mmol/L (ref 3.5–5.1)
Sodium: 137 mmol/L (ref 135–145)
Total Bilirubin: 0.7 mg/dL (ref 0.3–1.2)
Total Protein: 4.7 g/dL — ABNORMAL LOW (ref 6.5–8.1)

## 2020-06-22 LAB — RENAL FUNCTION PANEL
Albumin: 1.6 g/dL — ABNORMAL LOW (ref 3.5–5.0)
Anion gap: 10 (ref 5–15)
BUN: 30 mg/dL — ABNORMAL HIGH (ref 8–23)
CO2: 26 mmol/L (ref 22–32)
Calcium: 7.9 mg/dL — ABNORMAL LOW (ref 8.9–10.3)
Chloride: 101 mmol/L (ref 98–111)
Creatinine, Ser: 1.94 mg/dL — ABNORMAL HIGH (ref 0.61–1.24)
GFR, Estimated: 37 mL/min — ABNORMAL LOW (ref >60)
Glucose, Bld: 157 mg/dL — ABNORMAL HIGH (ref 70–99)
Phosphorus: 1.5 mg/dL — ABNORMAL LOW (ref 2.5–4.6)
Potassium: 3.4 mmol/L — ABNORMAL LOW (ref 3.5–5.1)
Sodium: 137 mmol/L (ref 135–145)

## 2020-06-22 LAB — CBC
HCT: 23.9 % — ABNORMAL LOW (ref 39.0–52.0)
Hemoglobin: 7.7 g/dL — ABNORMAL LOW (ref 13.0–17.0)
MCH: 29.8 pg (ref 26.0–34.0)
MCHC: 32.2 g/dL (ref 30.0–36.0)
MCV: 92.6 fL (ref 80.0–100.0)
Platelets: 90 10*3/uL — ABNORMAL LOW (ref 150–400)
RBC: 2.58 MIL/uL — ABNORMAL LOW (ref 4.22–5.81)
RDW: 16.4 % — ABNORMAL HIGH (ref 11.5–15.5)
WBC: 16.3 10*3/uL — ABNORMAL HIGH (ref 4.0–10.5)
nRBC: 0.2 % (ref 0.0–0.2)

## 2020-06-22 LAB — GLUCOSE, CAPILLARY
Glucose-Capillary: 116 mg/dL — ABNORMAL HIGH (ref 70–99)
Glucose-Capillary: 141 mg/dL — ABNORMAL HIGH (ref 70–99)
Glucose-Capillary: 144 mg/dL — ABNORMAL HIGH (ref 70–99)
Glucose-Capillary: 152 mg/dL — ABNORMAL HIGH (ref 70–99)
Glucose-Capillary: 153 mg/dL — ABNORMAL HIGH (ref 70–99)
Glucose-Capillary: 175 mg/dL — ABNORMAL HIGH (ref 70–99)

## 2020-06-22 LAB — POCT ACTIVATED CLOTTING TIME: Activated Clotting Time: 178 seconds

## 2020-06-22 LAB — TROPONIN I (HIGH SENSITIVITY): Troponin I (High Sensitivity): 127 ng/L (ref <18)

## 2020-06-22 LAB — C-REACTIVE PROTEIN: CRP: 25.7 mg/dL — ABNORMAL HIGH (ref <1.0)

## 2020-06-22 LAB — MAGNESIUM: Magnesium: 2.9 mg/dL — ABNORMAL HIGH (ref 1.7–2.4)

## 2020-06-22 LAB — PHOSPHORUS: Phosphorus: 2.6 mg/dL (ref 2.5–4.6)

## 2020-06-22 MED ORDER — INSULIN ASPART 100 UNIT/ML ~~LOC~~ SOLN: 0.0000 [IU] | Freq: Every day | SUBCUTANEOUS | Status: DC

## 2020-06-22 MED ORDER — INSULIN ASPART 100 UNIT/ML ~~LOC~~ SOLN: 0.0000 [IU] | Freq: Three times a day (TID) | SUBCUTANEOUS | Status: DC

## 2020-06-22 MED ORDER — PRISMASOL BGK 4/2.5 32-4-2.5 MEQ/L REPLACEMENT SOLN
Status: DC
  Filled 2020-06-22 (×4): qty 5000

## 2020-06-22 MED ORDER — HEPARIN SODIUM (PORCINE) 5000 UNIT/ML IJ SOLN
5000.0000 [IU] | Freq: Three times a day (TID) | INTRAMUSCULAR | Status: DC
  Administered 2020-06-22 – 2020-07-02 (×28): 5000 [IU] via SUBCUTANEOUS
  Filled 2020-06-22 (×28): qty 1

## 2020-06-22 MED ORDER — PRISMASOL BGK 4/2.5 32-4-2.5 MEQ/L REPLACEMENT SOLN
Status: DC
  Filled 2020-06-22 (×5): qty 5000

## 2020-06-22 NOTE — Progress Notes (Signed)
eLink Physician-Brief Progress Note Patient Name: Albert Harris DOB: 1956/02/22 MRN: [id number]   Date of Service  06/22/2020  HPI/Events of Note  Asking for AC/HS CBG and insuline. Covid on Decadron, so far CBG < 180, last one > 170 Renal, BMI 20. Eating. Not NPO.  eICU Interventions  AC/HS SSI/CBC ordered, discussed with RN also.      Intervention Category Intermediate Interventions: Hyperglycemia - evaluation and treatment  [name] 06/22/2020, 9:05 PM

## 2020-06-22 NOTE — Progress Notes (Signed)
NAME:  Albert Harris, MRN:  [id number], DOB:  02/18/1956, LOS: 13 ADMISSION DATE:  06/16/2020, CONSULTATION DATE:  06/18/20 REFERRING MD:  FMTS, CHIEF COMPLAINT:  Decompensation -- Hypoxia    Brief History:  65 yo M + COVID, bacteremia, CKD V, HFpEF admitted to [place], who on 1/11 has had increasing O2 req, from RA to 6L (SpO2 94-99% on 6L)  Initially eval by PCCM - rec SDU admit and urgent HD. Unable to receive HD due to AVF infiltration. Moved to ICU 1/12 AM for HD placement and ongoing care  History of Present Illness:  65 yo M PMH HFpEF, CKD V, HTN, [name], PVD, COPD admitted to [facility] 1/19 for SOB. Found to be COVID-19 positive. CXR 1/9 with bilateral opacities, possibly pulm edema. In ED, Cr 15 -- plan to start [facility] 1/11. On 1/11 pt with hgb drop from 8.4 to 7.8, for which 1 PRBC was ordered and approx 1/2 unit was completed.  Pt is planned to start iHD 1/11, however this has not yet occurred.On 1/11 pt also with new O2 requirement -- now on 6LNC from RA, with resultant SpO2 94-99%. BCx have resulted with H Flu. Pt also c/o new severe abdominal pain -- CTA c/a/p obtained and reveal bilateral pulm opacities, multiple atherosclerotic occlusion and high grade stenoses, small volume of pelvic free fluid, diverticulosis.   VVS, CCS, and PCCM consulted in this setting.   Of note, patient is a former smoker, quit 07/17/2019 with a 50 pack year smoking history  Past Medical History:  PVD HFpEF CKDV HTN COPD Tobacco use disorder  GERD  Significant Hospital Events:  1/9 admitted to [place], covid +. Presenting Cr 15.  1/11 BCx H.Flu. Hgb 7.8, 1 PRBC ordered. Plan to start iHd, however has not occurred. New O2 requirement. New severe abdominal pain. PCCM, CCS, VVS consulted for hypoxia, abdominal pain + fluid collection, and atherosclerotic occlusions respectively. 1/12 couldn't get HD overnight due to infiltrated fistula. Moved to ICU with progressive AGMA in this setting as will need temp HD cath + iHD.  HD cath placed R IJ. Nephro plans for HD, possible CRRT   Consults:  Nephrology CCS VVS PCCM   Procedures:  1/12 R IJ HD cath>>   Significant Diagnostic Tests:  1/9 CXR> bilateral pulmonary opacities - PNA vs edema 1/11 CTA c/a/p- no PE. Bilat pulm opacities. Multiple atherosclerotic occlusions and high-grade occlusions: R SFA occlusion, bilat internal iliac artery occulsion, bilat external iliac artery severe stenosis, progressed severe stenosis of R vertebral artery, moderate-severe stenosis proximal SMA. Small volume of pelvic free fluid. Diverticulosis 1/12 CXR- interval placement of R IJ HD cath, tip in SVC. Stable bilateral pulmonary opacities.   06/21/2020 Arteriography with intervention Ultrasound-guided access to the left common femoral artery Aortogram with bilateral iliac arteriogram and bilateral lower extremity runoff Selective catheterization of the right external iliac artery (second order catheterization) Angioplasty and stenting of the right external iliac artery (6 X 5 Viabahn X2) Angioplasty and stenting of the right common iliac artery (7 X 39 VBX) Angioplasty and stenting of the left common iliac artery (8 X 39 VBX).  Micro Data:  1/9 COVID-19 positive 1/9 BCx> H flu, beta lactamase negative  1/9 UCx>  1/11 Tracheal aspirate>>  1/12 MRSA PCR> pos  Antimicrobials:  Ampicillin 1/11>  COVID therapies: remdes 1/10> Decadron 1/9>   Interim History / Subjective:   Remains in the icu on CVVHD critically ill  Objective   Blood pressure 132/73, pulse 82, temperature 97.6  F (36.4 C), temperature source Oral, resp. rate 20, height 5\' 8"  (1.727 m), weight 61.7 kg, SpO2 96 %.        Intake/Output Summary (Last 24 hours) at 06/22/2020 1207 Last data filed at 06/22/2020 1100 Gross per 24 hour  Intake 1367.73 ml  Output 2809 ml  Net -1441.27 ml   Filed Weights   06/20/20 0500 06/21/20 0500 06/22/20 0500  Weight: 66.2 kg 65.7 kg 61.7 kg    Examination:   General: elderly frail gentleman, on cvvhd in icu  HENT: NCAT, tracking  Lungs: CTAB, no crackles, no wheeze  Cardiovascular: s1 s2 , RRR  Abdomen: soft, nt nd  Extremities: BLE better perfused warm to touch Neuro: AAOX3 following commands, moves all 4   Skin: LE mottling improved   Resolved Hospital Problem list     Assessment & Plan:   Acute hypoxemic respiratory failure Acute hypoxic respiratory failure, multifactorial due to volume overload/pulm edema in setting of renal failure, COVID-19 PNA  P: - wean [place] o2 as tolerated, SPO2 goal >90% - remdes + steroids  - mobilize when able   AGMA  CKD V, with progression to ESRD, uremia AVF infiltrated 1/11 overnight, couldn't get HD  BUN >> now 36>> resolving Hyperkalemia>> resolved Metabolic acidosis (+ag) 2/2 advanced CKD  -suspect multifactorial -- largely driven by renal failure, bacteremia as below P CVVHD continued per nephrology   H flu bacteremia - continue ampicillin   GIB Anemia of critical illness / ESRD Abdominal pain - resolved, was seen by surgery  Plan - conservative transfusion threshold for hgb < 7  Close observation   Hypocalcemia - replete as needed   Arterial occlusive disease Infiltrated AVF ABDOMINAL AORTOGRAM W/LOWER EXTREMITY (N/A) as a surgical intervention ( stent placement) 1/14 - VVS following - intervention 1/14  Delirium, agitation - likely exacerbated by steroids, icu delirium +/- ETOH withdrawal PLAN-  precedex if needed  Delirium precautions   ?ST elevation - denies chest pain  PLAN -  Follow ecg and tele   Former smoker  Quit 2021 with a 50 pack year smoking history Bronchospasm/ wheezing noted on exam 1/14 Plan Continue bronchodilators  Best practice (evaluated daily)  Diet: NPO Pain/Anxiety/Delirium protocol (if indicated): Fentanyl per primary VAP protocol (if indicated): NA DVT prophylaxis: SQH GI prophylaxis: Per primary Glucose control: Per primary Mobility:  Bedrest Disposition: ICU  Goals of Care:   Last date of multidisciplinary goals of care discussion: Per primary Family and staff present:  Summary of discussion:  Follow up goals of care discussion due: 1/18 Code Status: FULL Family update: wife updated on 1/14.  Labs   CBC: Recent Labs  Lab 06/17/20 1046 06/18/20 0343 06/18/20 1836 06/19/20 0042 06/19/20 0322 06/20/20 0457 06/21/20 0446 06/22/20 0404  WBC 19.5* 15.8*  --  7.5  --  10.7* 13.3* 16.3*  NEUTROABS 17.9* 15.6*  --  6.8  --  8.8* 12.3*  --   HGB 8.4* 7.8*   < > 9.2* 8.8* 8.1* 8.1* 7.7*  HCT 28.2* 23.9*   < > 28.4* 26.0* 23.6* 23.1* 23.9*  MCV 102.2* 95.6  --  93.7  --  88.4 89.5 92.6  PLT 164 165  --  128*  --  103* 91* 90*   < > = values in this interval not displayed.    Basic Metabolic Panel: Recent Labs  Lab 06/20/20 0214 06/20/20 0456 06/20/20 1600 06/21/20 0446 06/21/20 1544 06/22/20 0404  NA 140 139 138 138 139 137  K  4.3 4.3 3.9 3.6 3.3* 3.5  CL 102 101 103 102 102 101  CO2 19* 20* 23 22 21* 25  GLUCOSE 103* 94 104* 103* 95 155*  BUN 83* 75* 50* 36* 42* 32*  CREATININE 8.54* 7.47* 4.78* 3.38* 3.55* 2.52*  CALCIUM 7.2* 7.4* 7.1* 7.7* 7.1* 7.6*  MG 2.7*  --   --  2.8*  --  2.9*  PHOS  --  7.3* 6.9* 4.4 4.5 2.6   GFR: Estimated Creatinine Clearance: 25.2 mL/min (A) (by C-G formula based on SCr of 2.52 mg/dL (H)). Recent Labs  Lab 06/16/20 1404 06/16/20 1547 06/16/20 1647 06/17/20 1046 06/18/20 1912 06/19/20 0042 06/20/20 0457 06/21/20 0446 06/22/20 0404  PROCALCITON 44.75  --   --   --   --   --   --   --   --   WBC  --    < >  --    < >  --  7.5 10.7* 13.3* 16.3*  LATICACIDVEN 1.2  --  1.1  --  1.0 1.4  --   --   --    < > = values in this interval not displayed.    Liver Function Tests: Recent Labs  Lab 06/18/20 0343 06/19/20 0042 06/19/20 1600 06/20/20 0214 06/20/20 0456 06/20/20 1600 06/21/20 0446 06/21/20 1544 06/22/20 0404  AST 27 26  --  41  --   --  66*  --   49*  ALT 18 21  --  25  --   --  26  --  24  ALKPHOS 46 41  --  52  --   --  54  --  63  BILITOT 0.4 0.5  --  0.8  --   --  0.9  --  0.7  PROT 4.7* 4.6*  --  5.0*  --   --  4.9*  --  4.7*  ALBUMIN 1.8* 1.8*   < > 1.6* 1.7* 1.6* 1.5* 1.5* 1.5*   < > = values in this interval not displayed.   No results for input(s): LIPASE, AMYLASE in the last 168 hours. No results for input(s): AMMONIA in the last 168 hours.  ABG    Component Value Date/Time   PHART 7.202 (L) 06/19/2020 0322   PCO2ART 21.6 (L) 06/19/2020 0322   PO2ART 71 (L) 06/19/2020 0322   HCO3 8.6 (L) 06/19/2020 0322   TCO2 9 (L) 06/19/2020 0322   ACIDBASEDEF 18.0 (H) 06/19/2020 0322   O2SAT 92.0 06/19/2020 0322     Coagulation Profile: Recent Labs  Lab 06/16/20 1404  INR 1.2    Cardiac Enzymes: No results for input(s): CKTOTAL, CKMB, CKMBINDEX, TROPONINI in the last 168 hours.  HbA1C: Hgb A1c MFr Bld  Date/Time Value Ref Range Status  11/10/2017 07:21 AM 5.4 4.8 - 5.6 % Final    Comment:    (NOTE) Pre diabetes:          5.7%-6.4% Diabetes:              >6.4% Glycemic control for   <7.0% adults with diabetes   12/25/2016 03:50 AM 5.4 4.8 - 5.6 % Final    Comment:    (NOTE)         Pre-diabetes: 5.7 - 6.4         Diabetes: >6.4         Glycemic control for adults with diabetes: <7.0     CBG: Recent Labs  Lab 06/21/20 2118 06/21/20 2345 06/22/20 0419 06/22/20  7681 06/22/20 1113  GLUCAP 149* 131* 153* 116* 152*     This patient is critically ill with multiple organ system failure; which, requires frequent high complexity decision making, assessment, support, evaluation, and titration of therapies. This was completed through the application of advanced monitoring technologies and extensive interpretation of multiple databases. During this encounter critical care time was devoted to patient care services described in this note for 32 minutes.  Garner Nash, DO [facility] 06/22/2020 12:07 PM

## 2020-06-22 NOTE — Progress Notes (Signed)
[facility] Progress Note    Assessment/ Plan:   # CKD stage V--now ESRD  - setting of covid superimposed on advanced CKD . CRRT started 1/12 urgently given worsening respiratory status and timing. Remains hemodynamically stable -on CRRT-do not restart.  Let cartridge expire or clot (whichever comes first). Timing for IHD dictated on when CRRT stops -all 4k/2.5cal, labs reviewed -targeting net neg 50-100cc/hr to start with, can go up to net neg 100-200cc/hr if needed -appreciate ccm's assistance with access placement. RIJ temp line placed 1/12 -strict ins/outs -CLIP once more stable and out of ICU  # Acute hypoxic respiratory failure - Secondary to covid PNA  -per primary service: remdesivir+steroids  # Covid 19 PNA   - per primary service - optimizing volume status   # HTN  - BP acceptable, expecting drop/improvement with ultrafiltration  # Anemia of CKD  - transfuse prn for hgb <7 -concern for gib w/ brbpr, surg consulted  # Metabolic acidosis (+ag) 2/2 advanced CKD - improving w/ crrt  # Secondary hyperparathyroidism  - PTH was 201 on 06/10/20. Phos >30 -significantly improved -stop renvela, on crrt  #Hyperkalemia -CRRT, resoved  Arterial occlusive disease -vvs on board, arteriogram 1/14 w/ intervention: anio and stent of right ext iliac artery, right common iliac artery, left common iliac art  Recommendations conveyed to the primary service.  [name], MD [facility]  Subjective:   Due to the COVID pandemic, in attempts to limit transmission and over-utilization of resources (e.g. PPE), the patient was seen virtually today by means of chart review, discussion with staff, and virtual discussion with patient as needed.  No acute events. VSS. On RA now. Tolerating CRRT. Discussed with team and staff.   Objective:   BP (!) 123/59   Pulse 74   Temp 97.6 F (36.4 C) (Oral)   Resp 18   Ht 5\' 8"  (1.727 m)   Wt 61.7 kg    SpO2 95%   BMI 20.68 kg/m   Intake/Output Summary (Last 24 hours) at 06/22/2020 1255 Last data filed at 06/22/2020 1200 Gross per 24 hour  Intake 1482.63 ml  Output 3052 ml  Net -1569.37 ml   Weight change: -4 kg  Physical Exam: Not examined, see above  Imaging: PERIPHERAL VASCULAR CATHETERIZATION  Result Date: 06/21/2020  PATIENT: Albert Harris                                                               MRN: [id number] DOB: May 19, 1956                                            DATE OF PROCEDURE: 06/21/2020  INDICATIONS:   Albert Harris is a 65 y.o. male who presented with chronic multilevel arterial occlusive disease.  He was critically ill in the ICU with COVID-pneumonia and end-stage renal disease.  He had no Doppler flow in the right foot was having rest pain on the right foot therefore he was at risk for limb loss and I recommended proceeding with arteriography and possible intervention  PROCEDURE:   1. conscious sedation 2.  Ultrasound-guided access to the left common femoral artery  3.  Aortogram with bilateral iliac arteriogram and bilateral lower extremity runoff 4.  Selective catheterization of the right external iliac artery (second order catheterization) 5.  Angioplasty and stenting of the right external iliac artery (6 X 5 Viabahn X2) Angioplasty and stenting of the right common iliac artery (7 X 39 VBX) 6.  Angioplasty and stenting of the left common iliac artery (8 X 39 VBX)  SURGEON: Judeth Cornfield. Scot Dock, MD, FACS  ANESTHESIA: Local with sedation  EBL: Minimal  TECHNIQUE: The patient was brought to the peripheral vascular lab and was sedated. The period of conscious sedation was 126 minutes minutes.  During that time period, I was present face-to-face 100% of the time.  The patient was administered initially 1 mg of Versed and 50 mcg of fentanyl.  He received additional medications during the procedure.. [name] heart rate, blood pressure, and oxygen saturation were  monitored by the nurse continuously during the procedure.  Both groins were prepped and draped in the usual sterile fashion.  Under ultrasound guidance, after the skin was anesthetized, I cannulated the left common femoral artery with a micropuncture needle and a micropuncture sheath was introduced over a wire.  This was exchanged for a 5 [place] sheath over a Bentson wire.  By ultrasound the femoral artery was patent. A real-time image was obtained and sent to the server.  A 5 French sheath was placed on the right and then a pigtail catheter positioned at the L1 vertebral body.  Flush aortogram obtained.  The catheter was in position above the aortic bifurcation and an oblique iliac projection was obtained.  Next bilateral lower extremity runoff films were obtained.  The patient was noted to have significant disease in the right common iliac artery and also external iliac artery.  I elected to address this with angioplasty and stenting.  The pigtail catheter was exchanged for a crossover catheter which was positioned into the proximal right common iliac artery.  An angled Glidewire was advanced into the deep femoral artery and the straight catheter advanced over this and the Glidewire exchanged for a Rosen wire.  I then exchanged the 5 French sheath for a long 7 [place] destination sheath which was positioned over the [place] wire over the bifurcation into the external iliac artery on the right.  The patient was then heparinized.  ACT was monitored throughout the procedure.  I initially selected a 6 x 5 Viabahn stent which was deployed just above the inguinal ligament.  A second overlapping 6 x 5 Viabahn stent was then placed above this to cover the right external iliac artery disease.  Postdilatation was done with a 6 mm balloon and there was no residual stenosis on follow-up film.  I then addressed the right common iliac artery stenosis.  I selected a 7 mm x 39 mm VBX stent.  This was positioned across the  stenosis and then the sheath retracted to the level of the bifurcation.  The stent was deployed without difficulty to nominal pressure.  Completion film showed no residual stenosis.  On the left side with a 70% stenosis in the common iliac artery.  I elected to address this with angioplasty and stenting.  I selected an 8 mm x 39 mm VBX stent.  This was positioned across the stenosis and deployed without difficulty.  Of note the hypogastric arteries were occluded bilaterally.  The sheath was then retracted to just above the inguinal ligament.  Patient was transferred for removal of the sheath.  No  immediate complications were noted.  FINDINGS:  1. 2 renal arteries on the right and a single renal artery on the left.  The infrarenal aorta is patent with diffuse calcific disease but no focal stenosis. 2.  On the right side, which was the more symptomatic side, there was an 80% right common iliac artery stenosis and diffuse disease of the right external iliac artery.  These were both addressed with angioplasty and stenting as described above.  Below that there is calcific disease in the common femoral artery and deep femoral artery.  The superficial femoral artery is occluded at its origin with reconstitution of the above-knee popliteal artery.  There is three-vessel runoff on the right although there is diffuse tibial disease in the arteries are small. 3.  On the left side there was a 70% left common iliac artery stenosis which was successfully addressed with angioplasty and stenting.  Of note the hypogastric artery on the left is occluded.  There is moderate disease in the left external iliac artery.  The common femoral and deep femoral artery are patent.  The superficial femoral artery is occluded proximally.  There is reconstitution of the above-knee popliteal artery on the left with two-vessel runoff on the left via the peroneal and anterior tibial artery.  TASC Classification  Largest Sheath Size: 7 [place]   Target vessel: Right common iliac artery, right external iliac artery, left common iliac artery  % Stenosis: RIGHT Pre 80%. Post 0% for both the common iliac artery and external iliac artery.  LEFT: Pre-70%.  Post 0%.  Lesion length: Right common iliac artery 2 cm.  Left common iliac artery 2 cm.  Right external iliac artery 7 cm.  Calcification: Yes  Most impactful devices used (Up to 3):   6 x 5 Viabahn stent.  7 x 39 VBX stent.  8 x 39 VBX stent  Outflow: Disease present or not distal to the lesion treated and the  Flow in the distal vessel:  yes   [name], MD, FACS [facility]  DATE OF DICTATION:   06/21/2020     DG CHEST PORT 1 VIEW  Result Date: 06/22/2020 CLINICAL DATA:  COVID-19 pneumonia EXAM: PORTABLE CHEST 1 VIEW COMPARISON:  06/20/2020 chest radiograph. FINDINGS: Right internal jugular central venous catheter terminates in the upper third of the SVC. Stable cardiomediastinal silhouette with borderline mild cardiomegaly. No pneumothorax. No pleural effusion. Patchy opacities throughout the right greater than left lungs, similar. IMPRESSION: Stable patchy opacities throughout the right greater than left lungs, compatible with COVID-19 pneumonia. Electronically Signed   By: Ilona Sorrel M.D.   On: 06/22/2020 08:19    Labs: BMET Recent Labs  Lab 06/17/20 1046 06/18/20 0343 06/19/20 1600 06/20/20 0214 06/20/20 0456 06/20/20 1600 06/21/20 0446 06/21/20 1544 06/22/20 0404  NA 136   < > 140 140 139 138 138 139 137  K 4.9   < > 4.4 4.3 4.3 3.9 3.6 3.3* 3.5  CL 105   < > 102 102 101 103 102 102 101  CO2 <7*   < > 16* 19* 20* 23 22 21* 25  GLUCOSE 126*   < > 124* 103* 94 104* 103* 95 155*  BUN 113*   < > 113* 83* 75* 50* 36* 42* 32*  CREATININE 16.72*   < > 12.34* 8.54* 7.47* 4.78* 3.38* 3.55* 2.52*  CALCIUM 7.4*   < > 7.0* 7.2* 7.4* 7.1* 7.7* 7.1* 7.6*  PHOS >30.0*  --  >30.0*  --  7.3* 6.9* 4.4 4.5 2.6   < > = values in this  interval not displayed.   CBC Recent Labs  Lab 06/18/20 0343 06/18/20 1836 06/19/20 0042 06/19/20 0322 06/20/20 0457 06/21/20 0446 06/22/20 0404  WBC 15.8*  --  7.5  --  10.7* 13.3* 16.3*  NEUTROABS 15.6*  --  6.8  --  8.8* 12.3*  --   HGB 7.8*   < > 9.2* 8.8* 8.1* 8.1* 7.7*  HCT 23.9*   < > 28.4* 26.0* 23.6* 23.1* 23.9*  MCV 95.6  --  93.7  --  88.4 89.5 92.6  PLT 165  --  128*  --  103* 91* 90*   < > = values in this interval not displayed.    Medications:    . sodium chloride   Intravenous Once  . albuterol  1-2 puff Inhalation Q6H  . amLODipine  10 mg Oral Daily  . aspirin EC  81 mg Oral Daily  . atorvastatin  10 mg Oral q1800  . Chlorhexidine Gluconate Cloth  6 each Topical Q0600  . Chlorhexidine Gluconate Cloth  6 each Topical Q0600  . darbepoetin (ARANESP) injection - DIALYSIS  60 mcg Intravenous Q Wed-HD  . dexamethasone  6 mg Oral Q24H  . feeding supplement  1 Container Oral TID BM  . feeding supplement (NEPRO CARB STEADY)  237 mL Oral BID BM  . folic acid  1 mg Oral Daily  . heparin injection (subcutaneous)  5,000 Units Subcutaneous Q8H  . influenza vaccine adjuvanted  0.5 mL Intramuscular Tomorrow-1000  . ipratropium  2 puff Inhalation Q6H  . metoprolol tartrate  25 mg Oral BID  . multivitamin  1 tablet Oral QHS  . mupirocin ointment  1 application Nasal BID  . pneumococcal 23 valent vaccine  0.5 mL Intramuscular Tomorrow-1000  . polyethylene glycol  17 g Oral Daily  . sodium chloride flush  3 mL Intravenous Q12H  . thiamine  100 mg Oral Daily      [name], MD [facility] 06/22/2020, 12:55 PM

## 2020-06-22 NOTE — Progress Notes (Addendum)
Per orders, L groin site assessed q 15 min after px and after sheath d/c x 2 hr, then q 30 min x 4, then q 1 hour x 4. Pt w/ intermittent claudication in BLE: BLE remain persistent temp w/ occasional abiltiy to dopple post tib. Dr. Scot Dock aware and assessed BLE this afternoon at bs. Assessment expected and no new orders.

## 2020-06-22 NOTE — Progress Notes (Deleted)
Blood pressure cuff small, exchanged and reassessed for BP in parameters

## 2020-06-23 DIAGNOSIS — N185 Chronic kidney disease, stage 5: Secondary | ICD-10-CM | POA: Diagnosis not present

## 2020-06-23 DIAGNOSIS — N179 Acute kidney failure, unspecified: Secondary | ICD-10-CM | POA: Diagnosis not present

## 2020-06-23 LAB — GLUCOSE, CAPILLARY
Glucose-Capillary: 132 mg/dL — ABNORMAL HIGH (ref 70–99)
Glucose-Capillary: 112 mg/dL — ABNORMAL HIGH (ref 70–99)
Glucose-Capillary: 109 mg/dL — ABNORMAL HIGH (ref 70–99)
Glucose-Capillary: 146 mg/dL — ABNORMAL HIGH (ref 70–99)

## 2020-06-23 LAB — CBC
HCT: 27.3 % — ABNORMAL LOW (ref 39.0–52.0)
Hemoglobin: 8.7 g/dL — ABNORMAL LOW (ref 13.0–17.0)
MCH: 30.4 pg (ref 26.0–34.0)
MCHC: 31.9 g/dL (ref 30.0–36.0)
MCV: 95.5 fL (ref 80.0–100.0)
Platelets: 110 10*3/uL — ABNORMAL LOW (ref 150–400)
RBC: 2.86 MIL/uL — ABNORMAL LOW (ref 4.22–5.81)
RDW: 16.3 % — ABNORMAL HIGH (ref 11.5–15.5)
WBC: 29.9 10*3/uL — ABNORMAL HIGH (ref 4.0–10.5)
nRBC: 0.3 % — ABNORMAL HIGH (ref 0.0–0.2)

## 2020-06-23 LAB — RENAL FUNCTION PANEL
Albumin: 1.6 g/dL — ABNORMAL LOW (ref 3.5–5.0)
Albumin: 1.6 g/dL — ABNORMAL LOW (ref 3.5–5.0)
Anion gap: 10 (ref 5–15)
Anion gap: 9 (ref 5–15)
BUN: 28 mg/dL — ABNORMAL HIGH (ref 8–23)
BUN: 25 mg/dL — ABNORMAL HIGH (ref 8–23)
CO2: 26 mmol/L (ref 22–32)
CO2: 23 mmol/L (ref 22–32)
Calcium: 7.9 mg/dL — ABNORMAL LOW (ref 8.9–10.3)
Calcium: 7.2 mg/dL — ABNORMAL LOW (ref 8.9–10.3)
Chloride: 102 mmol/L (ref 98–111)
Chloride: 107 mmol/L (ref 98–111)
Creatinine, Ser: 1.73 mg/dL — ABNORMAL HIGH (ref 0.61–1.24)
Creatinine, Ser: 1.39 mg/dL — ABNORMAL HIGH (ref 0.61–1.24)
GFR, Estimated: 43 mL/min — ABNORMAL LOW (ref >60)
GFR, Estimated: 56 mL/min — ABNORMAL LOW (ref >60)
Glucose, Bld: 148 mg/dL — ABNORMAL HIGH (ref 70–99)
Glucose, Bld: 114 mg/dL — ABNORMAL HIGH (ref 70–99)
Phosphorus: 1.4 mg/dL — ABNORMAL LOW (ref 2.5–4.6)
Phosphorus: 1.1 mg/dL — ABNORMAL LOW (ref 2.5–4.6)
Potassium: 3.8 mmol/L (ref 3.5–5.1)
Potassium: 3.3 mmol/L — ABNORMAL LOW (ref 3.5–5.1)
Sodium: 138 mmol/L (ref 135–145)
Sodium: 139 mmol/L (ref 135–145)

## 2020-06-23 LAB — CULTURE, BLOOD (ROUTINE X 2)
Culture: NO GROWTH
Special Requests: ADEQUATE

## 2020-06-23 LAB — MAGNESIUM: Magnesium: 2.8 mg/dL — ABNORMAL HIGH (ref 1.7–2.4)

## 2020-06-23 MED ORDER — OXYCODONE HCL 5 MG PO TABS
5.0000 mg | ORAL_TABLET | Freq: Four times a day (QID) | ORAL | Status: DC | PRN
  Administered 2020-06-23 (×2): 10 mg via ORAL
  Filled 2020-06-23 (×2): qty 2

## 2020-06-23 MED ORDER — SODIUM CHLORIDE 0.9 % IV SOLN
2.0000 g | Freq: Two times a day (BID) | INTRAVENOUS | Status: DC
  Filled 2020-06-23 (×2): qty 2000

## 2020-06-23 MED ORDER — DIPHENHYDRAMINE HCL 25 MG PO CAPS
25.0000 mg | ORAL_CAPSULE | Freq: Once | ORAL | Status: AC
  Administered 2020-06-23: 25 mg via ORAL
  Filled 2020-06-23: qty 1

## 2020-06-23 MED ORDER — CHLORDIAZEPOXIDE HCL 5 MG PO CAPS
5.0000 mg | ORAL_CAPSULE | Freq: Two times a day (BID) | ORAL | Status: DC
  Administered 2020-06-23 – 2020-06-25 (×5): 5 mg via ORAL
  Filled 2020-06-23 (×5): qty 1

## 2020-06-23 MED ORDER — GUAIFENESIN 100 MG/5ML PO SOLN
5.0000 mL | ORAL | Status: DC | PRN
  Administered 2020-06-24 – 2020-06-26 (×3): 100 mg via ORAL
  Filled 2020-06-23: qty 5
  Filled 2020-06-23: qty 10
  Filled 2020-06-23: qty 5

## 2020-06-23 NOTE — Progress Notes (Signed)
NAME:  Albert Harris, MRN:  [id number], DOB:  01-08-1956, LOS: 25 ADMISSION DATE:  06/16/2020, CONSULTATION DATE:  06/18/20 REFERRING MD:  FMTS, CHIEF COMPLAINT:  Decompensation -- Hypoxia    Brief History:  65 yo M + COVID, bacteremia, CKD V, HFpEF admitted to [facility], who on 1/11 has had increasing O2 req, from RA to 6L (SpO2 94-99% on 6L)  Initially eval by PCCM - rec SDU admit and urgent HD. Unable to receive HD due to AVF infiltration. Moved to ICU 1/12 AM for HD placement and ongoing care  History of Present Illness:  65 yo M PMH HFpEF, CKD V, HTN, [name], PVD, COPD admitted to [facility] 1/19 for SOB. Found to be COVID-19 positive. CXR 1/9 with bilateral opacities, possibly pulm edema. In ED, Cr 15 -- plan to start [facility] 1/11. On 1/11 pt with hgb drop from 8.4 to 7.8, for which 1 PRBC was ordered and approx 1/2 unit was completed.  Pt is planned to start iHD 1/11, however this has not yet occurred.On 1/11 pt also with new O2 requirement -- now on 6LNC from RA, with resultant SpO2 94-99%. BCx have resulted with H Flu. Pt also c/o new severe abdominal pain -- CTA c/a/p obtained and reveal bilateral pulm opacities, multiple atherosclerotic occlusion and high grade stenoses, small volume of pelvic free fluid, diverticulosis.   VVS, CCS, and PCCM consulted in this setting.   Of note, patient is a former smoker, quit 07/17/2019 with a 50 pack year smoking history  Past Medical History:  PVD HFpEF CKDV HTN COPD Tobacco use disorder  GERD  Significant Hospital Events:  1/9 admitted to [facility], covid +. Presenting Cr 15.  1/11 BCx H.Flu. Hgb 7.8, 1 PRBC ordered. Plan to start iHd, however has not occurred. New O2 requirement. New severe abdominal pain. PCCM, CCS, VVS consulted for hypoxia, abdominal pain + fluid collection, and atherosclerotic occlusions respectively. 1/12 couldn't get HD overnight due to infiltrated fistula. Moved to ICU with progressive AGMA in this setting as will need temp HD cath + iHD.  HD cath placed R IJ. Nephro plans for HD, possible CRRT   Consults:  Nephrology CCS VVS PCCM   Procedures:  1/12 R IJ HD cath>>   Significant Diagnostic Tests:  1/9 CXR> bilateral pulmonary opacities - PNA vs edema 1/11 CTA c/a/p- no PE. Bilat pulm opacities. Multiple atherosclerotic occlusions and high-grade occlusions: R SFA occlusion, bilat internal iliac artery occulsion, bilat external iliac artery severe stenosis, progressed severe stenosis of R vertebral artery, moderate-severe stenosis proximal SMA. Small volume of pelvic free fluid. Diverticulosis 1/12 CXR- interval placement of R IJ HD cath, tip in SVC. Stable bilateral pulmonary opacities.   06/21/2020 Arteriography with intervention Ultrasound-guided access to the left common femoral artery Aortogram with bilateral iliac arteriogram and bilateral lower extremity runoff Selective catheterization of the right external iliac artery (second order catheterization) Angioplasty and stenting of the right external iliac artery (6 X 5 Viabahn X2) Angioplasty and stenting of the right common iliac artery (7 X 39 VBX) Angioplasty and stenting of the left common iliac artery (8 X 39 VBX).  Micro Data:  1/9 COVID-19 positive 1/9 BCx> H flu, beta lactamase negative  1/9 UCx>  1/11 Tracheal aspirate>>  1/12 MRSA PCR> pos  Antimicrobials:  Ampicillin 1/11>  COVID therapies: remdes 1/10> Decadron 1/9>   Interim History / Subjective:   Remains critically ill on CVVHD in the intensive care unit  Objective   Blood pressure 116/80, pulse 63,  temperature 98 F (36.7 C), temperature source Oral, resp. rate 17, height 5\' 8"  (1.727 m), weight 61.8 kg, SpO2 98 %.        Intake/Output Summary (Last 24 hours) at 06/23/2020 1252 Last data filed at 06/23/2020 1100 Gross per 24 hour  Intake 1557.77 ml  Output 3952 ml  Net -2394.23 ml   Filed Weights   06/21/20 0500 06/22/20 0500 06/23/20 0500  Weight: 65.7 kg 61.7 kg 61.8 kg     Examination:  General: Elderly frail gentleman, CVVHD in ICU HENT: NCAT, tracking appropriately Lungs: Clear to auscultation bilaterally no crackles no wheeze Cardiovascular: Regular rhythm, S1-S2 Abdomen: Soft, nontender nondistended Extremities: Bilateral lower extremities well perfused no cyanosis Neuro: Alert oriented following commands no focal deficit moves all 4 extremities Skin: No significant mottling  Resolved Hospital Problem list     Assessment & Plan:   Acute hypoxemic respiratory failure Acute hypoxic respiratory failure, multifactorial due to volume overload/pulm edema in setting of renal failure, COVID-19 PNA  P: -Wean no nasal cannula O2 to maintain SPO2 greater than 90% -Completed remdesivir, stopped steroids  AGMA  CKD V, 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 Plans I think today to stop and transition to IHD  H flu bacteremia -Continue ampicillin  GIB Anemia of critical illness / ESRD Abdominal pain - resolved, was seen by surgery  Plan -Conservative transfusion threshold for hemoglobin less than 7 - Close observation.  Hypocalcemia -Replete as needed  Arterial occlusive disease Infiltrated AVF ABDOMINAL AORTOGRAM W/LOWER EXTREMITY (N/A) as a surgical intervention ( stent placement) 1/14 -Appreciate vascular surgery input  Delirium, agitation - likely exacerbated by steroids, icu delirium +/- ETOH withdrawal PLAN-  Wean off Precedex Low-dose Librium  Former smoker  Quit 2021 with a 50 pack year smoking history Bronchospasm/ wheezing noted on exam 1/14 Plan Continue bronchodilators  Best practice (evaluated daily)  Diet: NPO Pain/Anxiety/Delirium protocol (if indicated): Fentanyl per  primary VAP protocol (if indicated): NA DVT prophylaxis: SQH GI prophylaxis: Per primary Glucose control: Per primary Mobility: Bedrest Disposition: ICU  Goals of Care:   Last date of multidisciplinary goals of care discussion: Per primary Family and staff present:  Summary of discussion:  Follow up goals of care discussion due: 1/18 Code Status: FULL Family update: Updated  Labs   CBC: Recent Labs  Lab 06/17/20 1046 06/18/20 0343 06/18/20 1836 06/19/20 0042 06/19/20 0322 06/20/20 0457 06/21/20 0446 06/22/20 0404  WBC 19.5* 15.8*  --  7.5  --  10.7* 13.3* 16.3*  NEUTROABS 17.9* 15.6*  --  6.8  --  8.8* 12.3*  --   HGB 8.4* 7.8*   < > 9.2* 8.8* 8.1* 8.1* 7.7*  HCT 28.2* 23.9*   < > 28.4* 26.0* 23.6* 23.1* 23.9*  MCV 102.2* 95.6  --  93.7  --  88.4 89.5 92.6  PLT 164 165  --  128*  --  103* 91* 90*   < > = values in this interval not displayed.    Basic Metabolic Panel: Recent Labs  Lab 06/20/20 0214 06/20/20 0456 06/21/20 0446 06/21/20 1544 06/22/20 0404 06/22/20 1630 06/23/20 0408  NA 140   < > 138 139 137 137 138  K 4.3   < > 3.6 3.3* 3.5 3.4* 3.8  CL 102   < >  102 102 101 101 102  CO2 19*   < > 22 21* 25 26 26   GLUCOSE 103*   < > 103* 95 155* 157* 148*  BUN 83*   < > 36* 42* 32* 30* 28*  CREATININE 8.54*   < > 3.38* 3.55* 2.52* 1.94* 1.73*  CALCIUM 7.2*   < > 7.7* 7.1* 7.6* 7.9* 7.9*  MG 2.7*  --  2.8*  --  2.9*  --  2.8*  PHOS  --    < > 4.4 4.5 2.6 1.5* 1.4*   < > = values in this interval not displayed.   GFR: Estimated Creatinine Clearance: 36.7 mL/min (A) (by C-G formula based on SCr of 1.73 mg/dL (H)). Recent Labs  Lab 06/16/20 1404 06/16/20 1547 06/16/20 1647 06/17/20 1046 06/18/20 1912 06/19/20 0042 06/20/20 0457 06/21/20 0446 06/22/20 0404  PROCALCITON 44.75  --   --   --   --   --   --   --   --   WBC  --    < >  --    < >  --  7.5 10.7* 13.3* 16.3*  LATICACIDVEN 1.2  --  1.1  --  1.0 1.4  --   --   --    < > = values in this  interval not displayed.    Liver Function Tests: Recent Labs  Lab 06/18/20 0343 06/19/20 0042 06/19/20 1600 06/20/20 0214 06/20/20 0456 06/21/20 0446 06/21/20 1544 06/22/20 0404 06/22/20 1630 06/23/20 0408  AST 27 26  --  41  --  66*  --  49*  --   --   ALT 18 21  --  25  --  26  --  24  --   --   ALKPHOS 46 41  --  52  --  54  --  63  --   --   BILITOT 0.4 0.5  --  0.8  --  0.9  --  0.7  --   --   PROT 4.7* 4.6*  --  5.0*  --  4.9*  --  4.7*  --   --   ALBUMIN 1.8* 1.8*   < > 1.6*   < > 1.5* 1.5* 1.5* 1.6* 1.6*   < > = values in this interval not displayed.   No results for input(s): LIPASE, AMYLASE in the last 168 hours. No results for input(s): AMMONIA in the last 168 hours.  ABG    Component Value Date/Time   PHART 7.202 (L) 06/19/2020 0322   PCO2ART 21.6 (L) 06/19/2020 0322   PO2ART 71 (L) 06/19/2020 0322   HCO3 8.6 (L) 06/19/2020 0322   TCO2 9 (L) 06/19/2020 0322   ACIDBASEDEF 18.0 (H) 06/19/2020 0322   O2SAT 92.0 06/19/2020 0322     Coagulation Profile: Recent Labs  Lab 06/16/20 1404  INR 1.2    Cardiac Enzymes: No results for input(s): CKTOTAL, CKMB, CKMBINDEX, TROPONINI in the last 168 hours.  HbA1C: Hgb A1c MFr Bld  Date/Time Value Ref Range Status  11/10/2017 07:21 AM 5.4 4.8 - 5.6 % Final    Comment:    (NOTE) Pre diabetes:          5.7%-6.4% Diabetes:              >6.4% Glycemic control for   <7.0% adults with diabetes   12/25/2016 03:50 AM 5.4 4.8 - 5.6 % Final    Comment:    (NOTE)  Pre-diabetes: 5.7 - 6.4         Diabetes: >6.4         Glycemic control for adults with diabetes: <7.0     CBG: Recent Labs  Lab 06/22/20 1630 06/22/20 1947 06/22/20 2338 06/23/20 0409 06/23/20 0756  GLUCAP 144* 175* 141* 132* 112*    This patient is critically ill with multiple organ system failure; which, requires frequent high complexity decision making, assessment, support, evaluation, and titration of therapies. This was completed  through the application of advanced monitoring technologies and extensive interpretation of multiple databases. During this encounter critical care time was devoted to patient care services described in this note for 32 minutes.  Garner Nash, DO [facility] 06/23/2020 12:52 PM

## 2020-06-23 NOTE — Progress Notes (Signed)
eLink Physician-Brief Progress Note Patient Name: JOCK MAHON DOB: [birth year] MRN: [id number]   Date of Service  06/23/2020  HPI/Events of Note  asking for something to help with C/O itching, able to take PO's  eICU Interventions  Benadryl oral ordered once     Intervention Category Minor Interventions: Other:  [name] 06/23/2020, 9:38 PM

## 2020-06-23 NOTE — Progress Notes (Signed)
[facility] Progress Note    Assessment/ Plan:   # CKD stage V--now ESRD  - setting of covid superimposed on advanced CKD . CRRT started 1/12 urgently given worsening respiratory status and timing. Remains hemodynamically stable -stop CRRT, rinse back, discussed w/ ccm -Plan for IHD via catheter on Tuesday provided labs are stable -appreciate ccm's assistance with access placement. RIJ temp line placed 1/12 -strict ins/outs -CLIP once more stable and out of ICU  # Acute hypoxic respiratory failure - Secondary to covid PNA  -per primary service: remdesivir+steroids  # Covid 19 PNA   - per primary service - optimizing volume status   # HTN  - BP acceptable, expecting drop/improvement with ultrafiltration  # Anemia of CKD  - transfuse prn for hgb <7 -concern for gib w/ brbpr, surg consulted  # Metabolic acidosis (+ag) 2/2 advanced CKD -improved w/ renal replacement therapy  # Secondary hyperparathyroidism  - PTH was 201 on 06/10/20. Phos >30 -significantly improved w/ crrt -stopped binder -phos now low, stopping crrt  #Hyperkalemia -CRRT, resoved -renal diet moving forward  Arterial occlusive disease -vvs on board, arteriogram 1/14 w/ intervention: anio and stent of right ext iliac artery, right common iliac artery, left common iliac art  Recommendations conveyed to the primary service.  [name], MD [facility]  Subjective:   Due to the COVID pandemic, in attempts to limit transmission and over-utilization of resources (e.g. PPE), the patient was seen virtually today by means of chart review, discussion with staff, and virtual discussion with patient as needed.  No acute events. VSS. Still on RA, net neg 2.1L. VSS. CRRT running smoothly   Objective:   BP 116/80   Pulse 63   Temp 98 F (36.7 C) (Oral)   Resp 17   Ht 5\' 8"  (1.727 m)   Wt 61.8 kg   SpO2 98%   BMI 20.72 kg/m   Intake/Output Summary (Last 24 hours) at  06/23/2020 1117 Last data filed at 06/23/2020 1100 Gross per 24 hour  Intake 1672.67 ml  Output 4195 ml  Net -2522.33 ml   Weight change: 0.1 kg  Physical Exam: Not examined, see above  Imaging: DG CHEST PORT 1 VIEW  Result Date: 06/22/2020 CLINICAL DATA:  COVID-19 pneumonia EXAM: PORTABLE CHEST 1 VIEW COMPARISON:  06/20/2020 chest radiograph. FINDINGS: Right internal jugular central venous catheter terminates in the upper third of the SVC. Stable cardiomediastinal silhouette with borderline mild cardiomegaly. No pneumothorax. No pleural effusion. Patchy opacities throughout the right greater than left lungs, similar. IMPRESSION: Stable patchy opacities throughout the right greater than left lungs, compatible with COVID-19 pneumonia. Electronically Signed   By: Ilona Sorrel M.D.   On: 06/22/2020 08:19    Labs: BMET Recent Labs  Lab 06/20/20 0456 06/20/20 1600 06/21/20 0446 06/21/20 1544 06/22/20 0404 06/22/20 1630 06/23/20 0408  NA 139 138 138 139 137 137 138  K 4.3 3.9 3.6 3.3* 3.5 3.4* 3.8  CL 101 103 102 102 101 101 102  CO2 20* 23 22 21* 25 26 26   GLUCOSE 94 104* 103* 95 155* 157* 148*  BUN 75* 50* 36* 42* 32* 30* 28*  CREATININE 7.47* 4.78* 3.38* 3.55* 2.52* 1.94* 1.73*  CALCIUM 7.4* 7.1* 7.7* 7.1* 7.6* 7.9* 7.9*  PHOS 7.3* 6.9* 4.4 4.5 2.6 1.5* 1.4*   CBC Recent Labs  Lab 06/18/20 0343 06/18/20 1836 06/19/20 0042 06/19/20 0322 06/20/20 0457 06/21/20 0446 06/22/20 0404  WBC 15.8*  --  7.5  --  10.7* 13.3* 16.3*  NEUTROABS 15.6*  --  6.8  --  8.8* 12.3*  --   HGB 7.8*   < > 9.2* 8.8* 8.1* 8.1* 7.7*  HCT 23.9*   < > 28.4* 26.0* 23.6* 23.1* 23.9*  MCV 95.6  --  93.7  --  88.4 89.5 92.6  PLT 165  --  128*  --  103* 91* 90*   < > = values in this interval not displayed.    Medications:    . sodium chloride   Intravenous Once  . albuterol  1-2 puff Inhalation Q6H  . amLODipine  10 mg Oral Daily  . aspirin EC  81 mg Oral Daily  . atorvastatin  10 mg Oral  q1800  . Chlorhexidine Gluconate Cloth  6 each Topical Q0600  . darbepoetin (ARANESP) injection - DIALYSIS  60 mcg Intravenous Q Wed-HD  . dexamethasone  6 mg Oral Q24H  . feeding supplement  1 Container Oral TID BM  . feeding supplement (NEPRO CARB STEADY)  237 mL Oral BID BM  . folic acid  1 mg Oral Daily  . heparin injection (subcutaneous)  5,000 Units Subcutaneous Q8H  . influenza vaccine adjuvanted  0.5 mL Intramuscular Tomorrow-1000  . insulin aspart  0-5 Units Subcutaneous QHS  . insulin aspart  0-9 Units Subcutaneous TID WC  . ipratropium  2 puff Inhalation Q6H  . metoprolol tartrate  25 mg Oral BID  . multivitamin  1 tablet Oral QHS  . mupirocin ointment  1 application Nasal BID  . pneumococcal 23 valent vaccine  0.5 mL Intramuscular Tomorrow-1000  . polyethylene glycol  17 g Oral Daily  . sodium chloride flush  3 mL Intravenous Q12H  . thiamine  100 mg Oral Daily      [name], MD [facility] 06/23/2020, 11:17 AM

## 2020-06-23 NOTE — Progress Notes (Addendum)
PHARMACY NOTE:  ANTIMICROBIAL RENAL DOSAGE ADJUSTMENT  Current antimicrobial regimen includes a mismatch between antimicrobial dosage and estimated renal function.  As per policy approved by the Pharmacy & Therapeutics and Medical Executive Committees, the antimicrobial dosage will be adjusted accordingly.  Current antimicrobial dosage:  Ampicillin 2g IV q8h  Indication: H influenza bacteremia   Renal Function: Stopping CRRT today with plan to transition to iHD on Tuesday Estimated Creatinine Clearance: 36.7 mL/min (A) (by C-G formula based on SCr of 1.73 mg/dL (H)).  Antimicrobial dosage has been changed to:  Ampicillin 2g IV q12h   Additional comments:   Thank you for allowing pharmacy to be a part of this patient's care.  Cristela Felt, PharmD Clinical Pharmacist  06/23/2020 1:41 PM

## 2020-06-24 ENCOUNTER — Inpatient Hospital Stay (HOSPITAL_COMMUNITY): Payer: Medicare Other | Admitting: Anesthesiology

## 2020-06-24 ENCOUNTER — Inpatient Hospital Stay (HOSPITAL_COMMUNITY): Admission: RE | Admit: 2020-06-24 | Payer: Medicare Other | Source: Ambulatory Visit

## 2020-06-24 ENCOUNTER — Inpatient Hospital Stay (HOSPITAL_COMMUNITY): Payer: Medicare Other

## 2020-06-24 ENCOUNTER — Encounter (HOSPITAL_COMMUNITY)
Admission: EM | Disposition: A | Discharge status: Home Health | Payer: Self-pay | Source: Home / Self Care | Attending: Family Medicine

## 2020-06-24 DIAGNOSIS — N179 Acute kidney failure, unspecified: Secondary | ICD-10-CM | POA: Diagnosis not present

## 2020-06-24 DIAGNOSIS — U071 COVID-19: Secondary | ICD-10-CM | POA: Diagnosis not present

## 2020-06-24 DIAGNOSIS — J069 Acute upper respiratory infection, unspecified: Secondary | ICD-10-CM | POA: Diagnosis not present

## 2020-06-24 DIAGNOSIS — N185 Chronic kidney disease, stage 5: Secondary | ICD-10-CM | POA: Diagnosis not present

## 2020-06-24 HISTORY — PX: INSERTION OF DIALYSIS CATHETER: SHX1324

## 2020-06-24 LAB — RENAL FUNCTION PANEL
Albumin: 1.6 g/dL — ABNORMAL LOW (ref 3.5–5.0)
Albumin: 1.6 g/dL — ABNORMAL LOW (ref 3.5–5.0)
Anion gap: 12 (ref 5–15)
Anion gap: 16 — ABNORMAL HIGH (ref 5–15)
BUN: 42 mg/dL — ABNORMAL HIGH (ref 8–23)
BUN: 57 mg/dL — ABNORMAL HIGH (ref 8–23)
CO2: 23 mmol/L (ref 22–32)
CO2: 19 mmol/L — ABNORMAL LOW (ref 22–32)
Calcium: 7.7 mg/dL — ABNORMAL LOW (ref 8.9–10.3)
Calcium: 6.8 mg/dL — ABNORMAL LOW (ref 8.9–10.3)
Chloride: 101 mmol/L (ref 98–111)
Chloride: 99 mmol/L (ref 98–111)
Creatinine, Ser: 2.8 mg/dL — ABNORMAL HIGH (ref 0.61–1.24)
Creatinine, Ser: 4.33 mg/dL — ABNORMAL HIGH (ref 0.61–1.24)
GFR, Estimated: 24 mL/min — ABNORMAL LOW (ref >60)
GFR, Estimated: 14 mL/min — ABNORMAL LOW (ref >60)
Glucose, Bld: 114 mg/dL — ABNORMAL HIGH (ref 70–99)
Glucose, Bld: 163 mg/dL — ABNORMAL HIGH (ref 70–99)
Phosphorus: <1 mg/dL — CL (ref 2.5–4.6)
Phosphorus: 5.5 mg/dL — ABNORMAL HIGH (ref 2.5–4.6)
Potassium: 3.6 mmol/L (ref 3.5–5.1)
Potassium: 4.7 mmol/L (ref 3.5–5.1)
Sodium: 136 mmol/L (ref 135–145)
Sodium: 134 mmol/L — ABNORMAL LOW (ref 135–145)

## 2020-06-24 LAB — GLUCOSE, CAPILLARY
Glucose-Capillary: 109 mg/dL — ABNORMAL HIGH (ref 70–99)
Glucose-Capillary: 120 mg/dL — ABNORMAL HIGH (ref 70–99)
Glucose-Capillary: 111 mg/dL — ABNORMAL HIGH (ref 70–99)
Glucose-Capillary: 188 mg/dL — ABNORMAL HIGH (ref 70–99)

## 2020-06-24 LAB — MAGNESIUM: Magnesium: 2.7 mg/dL — ABNORMAL HIGH (ref 1.7–2.4)

## 2020-06-24 LAB — PHOSPHORUS: Phosphorus: 4.7 mg/dL — ABNORMAL HIGH (ref 2.5–4.6)

## 2020-06-24 SURGERY — INSERTION OF DIALYSIS CATHETER
Anesthesia: General | Site: Chest | Laterality: Right

## 2020-06-24 MED ORDER — ROCURONIUM BROMIDE 10 MG/ML (PF) SYRINGE
PREFILLED_SYRINGE | INTRAVENOUS | Status: AC
  Filled 2020-06-24: qty 10

## 2020-06-24 MED ORDER — SODIUM CHLORIDE 0.9 % IV SOLN: INTRAVENOUS | Status: DC | PRN

## 2020-06-24 MED ORDER — HEPARIN SODIUM (PORCINE) 1000 UNIT/ML IJ SOLN
INTRAMUSCULAR | Status: AC
  Filled 2020-06-24: qty 1

## 2020-06-24 MED ORDER — POTASSIUM PHOSPHATES 15 MMOLE/5ML IV SOLN
45.0000 mmol | Freq: Once | INTRAVENOUS | Status: AC
  Administered 2020-06-24: 45 mmol via INTRAVENOUS
  Filled 2020-06-24: qty 15

## 2020-06-24 MED ORDER — FENTANYL CITRATE (PF) 100 MCG/2ML IJ SOLN
INTRAMUSCULAR | Status: DC | PRN
  Administered 2020-06-24: 50 ug via INTRAVENOUS

## 2020-06-24 MED ORDER — FENTANYL CITRATE (PF) 250 MCG/5ML IJ SOLN
INTRAMUSCULAR | Status: AC
  Filled 2020-06-24: qty 5

## 2020-06-24 MED ORDER — HEPARIN SODIUM (PORCINE) 1000 UNIT/ML IJ SOLN
INTRAMUSCULAR | Status: DC | PRN
  Administered 2020-06-24: 3.2 [IU]

## 2020-06-24 MED ORDER — DEXAMETHASONE SODIUM PHOSPHATE 10 MG/ML IJ SOLN
INTRAMUSCULAR | Status: AC
  Filled 2020-06-24: qty 1

## 2020-06-24 MED ORDER — DEXAMETHASONE SODIUM PHOSPHATE 10 MG/ML IJ SOLN
INTRAMUSCULAR | Status: DC | PRN
  Administered 2020-06-24: 4 mg via INTRAVENOUS

## 2020-06-24 MED ORDER — PHENYLEPHRINE HCL-NACL 10-0.9 MG/250ML-% IV SOLN
INTRAVENOUS | Status: AC
  Filled 2020-06-24: qty 250

## 2020-06-24 MED ORDER — SODIUM CHLORIDE 0.9 % IV SOLN
2.0000 g | INTRAVENOUS | Status: AC
  Administered 2020-06-24 – 2020-06-26 (×3): 2 g via INTRAVENOUS
  Filled 2020-06-24 (×3): qty 20

## 2020-06-24 MED ORDER — EPHEDRINE SULFATE-NACL 50-0.9 MG/10ML-% IV SOSY
PREFILLED_SYRINGE | INTRAVENOUS | Status: DC | PRN
  Administered 2020-06-24: 5 mg via INTRAVENOUS
  Administered 2020-06-24: 10 mg via INTRAVENOUS

## 2020-06-24 MED ORDER — SODIUM CHLORIDE 0.9 % IV SOLN
INTRAVENOUS | Status: DC | PRN
  Administered 2020-06-24: 500 mL

## 2020-06-24 MED ORDER — PROPOFOL 10 MG/ML IV BOLUS
INTRAVENOUS | Status: AC
  Filled 2020-06-24: qty 40

## 2020-06-24 MED ORDER — HYDROMORPHONE HCL 1 MG/ML IJ SOLN
0.5000 mg | Freq: Once | INTRAMUSCULAR | Status: AC
Start: 2020-06-24 — End: 2020-06-24
  Administered 2020-06-24: 0.5 mg via INTRAVENOUS
  Filled 2020-06-24: qty 0.5

## 2020-06-24 MED ORDER — LIDOCAINE 2% (20 MG/ML) 5 ML SYRINGE
INTRAMUSCULAR | Status: DC | PRN
  Administered 2020-06-24: 80 mg via INTRAVENOUS

## 2020-06-24 MED ORDER — 0.9 % SODIUM CHLORIDE (POUR BTL) OPTIME
TOPICAL | Status: DC | PRN
  Administered 2020-06-24: 1000 mL

## 2020-06-24 MED ORDER — ONDANSETRON HCL 4 MG/2ML IJ SOLN
INTRAMUSCULAR | Status: AC
  Filled 2020-06-24: qty 2

## 2020-06-24 MED ORDER — SUCCINYLCHOLINE CHLORIDE 200 MG/10ML IV SOSY
PREFILLED_SYRINGE | INTRAVENOUS | Status: DC | PRN
  Administered 2020-06-24: 120 mg via INTRAVENOUS

## 2020-06-24 MED ORDER — PHENYLEPHRINE HCL (PRESSORS) 10 MG/ML IV SOLN
INTRAVENOUS | Status: DC | PRN
  Administered 2020-06-24 (×2): 120 ug via INTRAVENOUS
  Administered 2020-06-24: 40 ug via INTRAVENOUS

## 2020-06-24 MED ORDER — LIDOCAINE 2% (20 MG/ML) 5 ML SYRINGE
INTRAMUSCULAR | Status: AC
  Filled 2020-06-24: qty 5

## 2020-06-24 MED ORDER — CEFAZOLIN SODIUM-DEXTROSE 1-4 GM/50ML-% IV SOLN
INTRAVENOUS | Status: DC | PRN
  Administered 2020-06-24: 1 g via INTRAVENOUS

## 2020-06-24 MED ORDER — PROPOFOL 10 MG/ML IV BOLUS
INTRAVENOUS | Status: DC | PRN
Start: 2020-06-24 — End: 2020-06-24
  Administered 2020-06-24: 140 mg via INTRAVENOUS

## 2020-06-24 MED ORDER — ONDANSETRON HCL 4 MG/2ML IJ SOLN
INTRAMUSCULAR | Status: DC | PRN
  Administered 2020-06-24: 4 mg via INTRAVENOUS

## 2020-06-24 MED ORDER — PHENYLEPHRINE HCL-NACL 10-0.9 MG/250ML-% IV SOLN
INTRAVENOUS | Status: DC | PRN
  Administered 2020-06-24: 25 ug/min via INTRAVENOUS

## 2020-06-24 MED ORDER — SODIUM CHLORIDE 0.9 % IV SOLN
INTRAVENOUS | Status: AC
  Filled 2020-06-24: qty 1.2

## 2020-06-24 MED ORDER — PANTOPRAZOLE SODIUM 40 MG PO TBEC
40.0000 mg | DELAYED_RELEASE_TABLET | Freq: Every day | ORAL | Status: DC
  Administered 2020-06-24 – 2020-06-26 (×3): 40 mg via ORAL
  Filled 2020-06-24 (×3): qty 1

## 2020-06-24 SURGICAL SUPPLY — 41 items
ADH SKN CLS APL DERMABOND .7 (GAUZE/BANDAGES/DRESSINGS) ×2
APL PRP STRL LF DISP 70% ISPRP (MISCELLANEOUS) ×1
BAG DECANTER FOR FLEXI CONT (MISCELLANEOUS) ×3 IMPLANT
BIOPATCH RED 1 DISK 7.0 (GAUZE/BANDAGES/DRESSINGS) ×2 IMPLANT
BIOPATCH RED 1IN DISK 7.0MM (GAUZE/BANDAGES/DRESSINGS) ×1
CATH PALINDROME-P 19CM W/VT (CATHETERS) ×2 IMPLANT
CATH PALINDROME-P 23CM W/VT (CATHETERS) IMPLANT
CATH PALINDROME-P 28CM W/VT (CATHETERS) IMPLANT
CHLORAPREP W/TINT 26 (MISCELLANEOUS) ×3 IMPLANT
COVER PROBE W GEL 5X96 (DRAPES) IMPLANT
COVER SURGICAL LIGHT HANDLE (MISCELLANEOUS) ×3 IMPLANT
COVER WAND RF STERILE (DRAPES) ×3 IMPLANT
DERMABOND ADVANCED (GAUZE/BANDAGES/DRESSINGS) ×4
DERMABOND ADVANCED .7 DNX12 (GAUZE/BANDAGES/DRESSINGS) IMPLANT
DRAPE C-ARM 42X72 X-RAY (DRAPES) ×3 IMPLANT
DRAPE CHEST BREAST 15X10 FENES (DRAPES) ×3 IMPLANT
GAUZE 4X4 16PLY RFD (DISPOSABLE) ×3 IMPLANT
GLOVE BIO SURGEON STRL SZ7.5 (GLOVE) ×3 IMPLANT
GLOVE BIOGEL PI IND STRL 8 (GLOVE) ×1 IMPLANT
GLOVE BIOGEL PI INDICATOR 8 (GLOVE) ×2
GOWN STRL REUS W/ TWL LRG LVL3 (GOWN DISPOSABLE) ×2 IMPLANT
GOWN STRL REUS W/TWL LRG LVL3 (GOWN DISPOSABLE) ×6
KIT BASIN OR (CUSTOM PROCEDURE TRAY) ×3 IMPLANT
KIT PALINDROME-P 55CM (CATHETERS) IMPLANT
KIT TURNOVER KIT B (KITS) ×3 IMPLANT
NDL 18GX1X1/2 (RX/OR ONLY) (NEEDLE) ×1 IMPLANT
NDL HYPO 25GX1X1/2 BEV (NEEDLE) ×1 IMPLANT
NEEDLE 18GX1X1/2 (RX/OR ONLY) (NEEDLE) ×3 IMPLANT
NEEDLE HYPO 25GX1X1/2 BEV (NEEDLE) ×3 IMPLANT
NS IRRIG 1000ML POUR BTL (IV SOLUTION) ×3 IMPLANT
PACK SURGICAL SETUP 50X90 (CUSTOM PROCEDURE TRAY) ×3 IMPLANT
PAD ARMBOARD 7.5X6 YLW CONV (MISCELLANEOUS) ×6 IMPLANT
SUT ETHILON 3 0 PS 1 (SUTURE) ×3 IMPLANT
SUT VICRYL 4-0 PS2 18IN ABS (SUTURE) ×3 IMPLANT
SYR 10ML LL (SYRINGE) ×3 IMPLANT
SYR 20ML LL LF (SYRINGE) ×6 IMPLANT
SYR 5ML LL (SYRINGE) ×6 IMPLANT
SYR CONTROL 10ML LL (SYRINGE) ×3 IMPLANT
TOWEL GREEN STERILE (TOWEL DISPOSABLE) ×6 IMPLANT
TOWEL GREEN STERILE FF (TOWEL DISPOSABLE) ×3 IMPLANT
WATER STERILE IRR 1000ML POUR (IV SOLUTION) ×3 IMPLANT

## 2020-06-24 NOTE — Progress Notes (Signed)
NAME:  Albert Harris, MRN:  [id number], DOB:  Sep 09, 1955, LOS: 15 ADMISSION DATE:  06/16/2020, CONSULTATION DATE:  06/18/20 REFERRING MD:  FMTS, CHIEF COMPLAINT:  Decompensation -- Hypoxia    Brief History:  65 yo M + COVID, bacteremia, CKD V, HFpEF admitted to [place], who on 1/11 has had increasing O2 req, from RA to 6L (SpO2 94-99% on 6L)  Initially eval by PCCM - rec SDU admit and urgent HD. Unable to receive HD due to AVF infiltration. Moved to ICU 1/12 AM for HD placement and ongoing care  History of Present Illness:  65 yo M PMH HFpEF, CKD V, HTN, [name], PVD, COPD admitted to [facility] 1/19 for SOB. Found to be COVID-19 positive. CXR 1/9 with bilateral opacities, possibly pulm edema. In ED, Cr 15 -- plan to start [facility] 1/11. On 1/11 pt with hgb drop from 8.4 to 7.8, for which 1 PRBC was ordered and approx 1/2 unit was completed.  Pt is planned to start iHD 1/11, however this has not yet occurred.On 1/11 pt also with new O2 requirement -- now on 6LNC from RA, with resultant SpO2 94-99%. BCx have resulted with H Flu. Pt also c/o new severe abdominal pain -- CTA c/a/p obtained and reveal bilateral pulm opacities, multiple atherosclerotic occlusion and high grade stenoses, small volume of pelvic free fluid, diverticulosis.   VVS, CCS, and PCCM consulted in this setting.   Of note, patient is a former smoker, quit 07/17/2019 with a 50 pack year smoking history  Past Medical History:  PVD HFpEF CKDV HTN COPD Tobacco use disorder  GERD  Significant Hospital Events:  1/9 admitted to [place], covid +. Presenting Cr 15.  1/11 BCx H.Flu. Hgb 7.8, 1 PRBC ordered. Plan to start iHd, however has not occurred. New O2 requirement. New severe abdominal pain. PCCM, CCS, VVS consulted for hypoxia, abdominal pain + fluid collection, and atherosclerotic occlusions respectively. 1/12 couldn't get HD overnight due to infiltrated fistula. Moved to ICU with progressive AGMA in this setting as will need temp HD cath + iHD.  HD cath placed R IJ. Nephro plans for HD, possible CRRT   1/14 abdominal aortogram with stent   Consults:  Nephrology CCS VVS PCCM   Procedures:  1/12 R IJ HD cath>>   Significant Diagnostic Tests:  1/9 CXR> bilateral pulmonary opacities - PNA vs edema 1/11 CTA c/a/p- no PE. Bilat pulm opacities. Multiple atherosclerotic occlusions and high-grade occlusions: R SFA occlusion, bilat internal iliac artery occulsion, bilat external iliac artery severe stenosis, progressed severe stenosis of R vertebral artery, moderate-severe stenosis proximal SMA. Small volume of pelvic free fluid. Diverticulosis 1/12 CXR- interval placement of R IJ HD cath, tip in SVC. Stable bilateral pulmonary opacities.   06/21/2020 Arteriography with intervention Ultrasound-guided access to the left common femoral artery Aortogram with bilateral iliac arteriogram and bilateral lower extremity runoff Selective catheterization of the right external iliac artery (second order catheterization) Angioplasty and stenting of the right external iliac artery (6 X 5 Viabahn X2) Angioplasty and stenting of the right common iliac artery (7 X 39 VBX) Angioplasty and stenting of the left common iliac artery (8 X 39 VBX).  Micro Data:  1/9 COVID-19 positive 1/9 BCx> H flu, beta lactamase negative  1/9 UCx>  1/11 Tracheal aspirate>>  1/12 MRSA PCR> pos  Antimicrobials:  Ampicillin 1/11>  COVID therapies: remdes 1/10> 1/15 Decadron 1/9>   Interim History / Subjective:   Cr incr overnight from 1.39 to 2.8  Phos is <1,  K is 3.6 -- getting KPhos this morning  States he wants to go home. Discussed need for ongoing medical interventions, which he agrees he needs.   Joined at bedside by Dr. Scot Dock VVS-- coordinating placement of HD cath  Objective   Blood pressure 134/67, pulse (!) 103, temperature 97.8 F (36.6 C), temperature source Oral, resp. rate (!) 21, height 5\' 8"  (1.727 m), weight 60.3 kg, SpO2 99 %.         Intake/Output Summary (Last 24 hours) at 06/24/2020 0658 Last data filed at 06/24/2020 0600 Gross per 24 hour  Intake 2084.47 ml  Output 2037 ml  Net 47.47 ml   Filed Weights   06/22/20 0500 06/23/20 0500 06/24/20 0316  Weight: 61.7 kg 61.8 kg 60.3 kg    Examination:  General: Chronically ill appearing M, appears older than stated age. Reclined in bed NAD HENT: NCAT Pink mm. Trachea midline anicteric sclera. R IJ temp HD cath  Lungs: Symmetrical chest expansion, even and unlabored on RA. Scattered bibasilar crackles, no wheeze  Cardiovascular: tachycardic rate reg rhythm s1s2 no rgm  Abdomen: Soft round ndnt + bowel sounds Extremities: Dopplerable pedal pulses. Palpable radial pulses.  No obvious joint deformity. No clubbing Neuro: AAOx3. PERRL Following commands  Skin: c/d no rash Improved mottling   Resolved Hospital Problem list   Hyperkalemia Uremia  Abdominal Pain AGMA Assessment & Plan:   Acute encephalopathy with hyperactive delirium, improving  -Hyperactive ICU delirium, steroids, ?EtOH dt P -Delirium precautions -Off steroids -Off dexmed  -Librium    Acute hypoxemic respiratory failure, improved  Acute hypoxic respiratory failure, multifactorial due to volume overload/pulm edema in setting of renal failure, COVID-19 PNA though with great improvement from CRRT, likely that much was r/t volume COVID-19 PNA -s/p remdes and steroids  P: -SpO2 goal > 90%, has been weaned to RA  [employer], IS  - ongoing volume removal per nephro   Sepsis due to H. 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 Getting KPhos 1/17  Arterial occlusive disease Infiltrated AVF ABDOMINAL AORTOGRAM W/LOWER EXTREMITY (N/A) as a surgical intervention ( stent placement) 1/14 P -VVS following, perfusion improving following stent -going for tunneled cath 1/17 -- appreciate Dr. Scot Dock facilitating this   Hx Tobacco use disorder Quit 2021 with a 50 pack year smoking history Bronchospasm/wheezing, improved P -Continue bronchodilators  Best practice (evaluated daily)  Diet: NPO Pain/Anxiety/Delirium protocol (if indicated): Fentanyl per primary VAP protocol (if indicated): NA DVT prophylaxis: SQH GI prophylaxis: Per primary Glucose control: Per primary Mobility: BR  Disposition: ICU at present likely able to transfer out of ICU after HD cath placement, will evaluate after procedure  Goals of Care:   Last date of multidisciplinary goals of care discussion:  Family and staff present:  Summary of discussion:  Follow up goals of care discussion due: 1/18 Code Status: FULL Family update: Updated  Labs   CBC: Recent Labs  Lab 06/17/20 1046 06/18/20 0343 06/18/20 1836 06/19/20 0042 06/19/20 0322 06/20/20 0457 06/21/20 0446 06/22/20 0404 06/23/20 1732  WBC 19.5* 15.8*  --  7.5  --  10.7* 13.3* 16.3* 29.9*  NEUTROABS 17.9* 15.6*  --  6.8  --  8.8* 12.3*  --   --   HGB 8.4* 7.8*   < > 9.2* 8.8* 8.1* 8.1* 7.7* 8.7*  HCT 28.2* 23.9*   < > 28.4* 26.0* 23.6* 23.1* 23.9* 27.3*  MCV 102.2* 95.6  --  93.7  --  88.4 89.5 92.6 95.5  PLT 164 165  --  128*  --  103* 91* 90* 110*   < > = values in this interval not displayed.    Basic Metabolic Panel: Recent Labs  Lab 06/20/20 0214 06/20/20 0456 06/21/20 0446 06/21/20 1544 06/22/20 0404 06/22/20 1630 06/23/20 0408 06/23/20 1732 06/24/20 0309  NA 140   < > 138   < > 137 137 138 139 136  K 4.3   < > 3.6   < > 3.5 3.4* 3.8 3.3* 3.6  CL 102   < > 102   < > 101 101 102 107 101  CO2 19*   < > 22   < > 25 26 26 23 23    GLUCOSE 103*   < > 103*   < > 155* 157* 148* 114* 114*  BUN 83*   < > 36*   < > 32* 30* 28* 25* 42*  CREATININE 8.54*   < > 3.38*   < > 2.52* 1.94* 1.73* 1.39* 2.80*  CALCIUM 7.2*   < > 7.7*   < > 7.6* 7.9* 7.9* 7.2* 7.7*  MG 2.7*  --  2.8*  --  2.9*  --  2.8*  --  2.7*  PHOS  --    < > 4.4   < > 2.6 1.5* 1.4* 1.1* <1.0*   < > = values in this interval not displayed.   GFR: Estimated Creatinine Clearance: 22.1 mL/min (A) (by C-G formula based on SCr of 2.8 mg/dL (H)). Recent Labs  Lab 06/18/20 1912 06/19/20 0042 06/20/20 0457 06/21/20 0446 06/22/20 0404 06/23/20 1732  WBC  --  7.5 10.7* 13.3* 16.3* 29.9*  LATICACIDVEN 1.0 1.4  --   --   --   --     Liver Function Tests: Recent Labs  Lab 06/18/20 0343 06/19/20 0042 06/19/20 1600 06/20/20 0214 06/20/20 0456 06/21/20 0446 06/21/20 1544 06/22/20 0404 06/22/20 1630 06/23/20 0408 06/23/20 1732 06/24/20 0309  AST 27 26  --  41  --  66*  --  49*  --   --   --   --   ALT 18 21  --  25  --  26  --  24  --   --   --   --   ALKPHOS 46 41  --  52  --  54  --  63  --   --   --   --   BILITOT 0.4 0.5  --  0.8  --  0.9  --  0.7  --   --   --   --   PROT 4.7* 4.6*  --  5.0*  --  4.9*  --  4.7*  --   --   --   --   ALBUMIN 1.8* 1.8*   < > 1.6*   < > 1.5*   < > 1.5* 1.6* 1.6* 1.6* 1.6*   < > = values in this interval not displayed.   No results for input(s): LIPASE, AMYLASE in the last 168 hours. No results for input(s): AMMONIA in the last 168 hours.  ABG  Component Value Date/Time   PHART 7.202 (L) 06/19/2020 0322   PCO2ART 21.6 (L) 06/19/2020 0322   PO2ART 71 (L) 06/19/2020 0322   HCO3 8.6 (L) 06/19/2020 0322   TCO2 9 (L) 06/19/2020 0322   ACIDBASEDEF 18.0 (H) 06/19/2020 0322   O2SAT 92.0 06/19/2020 0322     Coagulation Profile: No results for input(s): INR, PROTIME in the last 168 hours.  Cardiac Enzymes: No results for input(s): CKTOTAL, CKMB, CKMBINDEX, TROPONINI in the last 168 hours.  HbA1C: Hgb A1c MFr Bld   Date/Time Value Ref Range Status  11/10/2017 07:21 AM 5.4 4.8 - 5.6 % Final    Comment:    (NOTE) Pre diabetes:          5.7%-6.4% Diabetes:              >6.4% Glycemic control for   <7.0% adults with diabetes   12/25/2016 03:50 AM 5.4 4.8 - 5.6 % Final    Comment:    (NOTE)         Pre-diabetes: 5.7 - 6.4         Diabetes: >6.4         Glycemic control for adults with diabetes: <7.0     CBG: Recent Labs  Lab 06/22/20 2338 06/23/20 0409 06/23/20 0756 06/23/20 1709 06/23/20 2106  GLUCAP 141* 132* 112* 109* 146*    CCT: n/a   Albert Gum MSN, AGACNP-BC [place] [phone] If no answer, [phone] 06/24/2020, 8:29 AM

## 2020-06-24 NOTE — Anesthesia Procedure Notes (Signed)
Procedure Name: Intubation Date/Time: 06/24/2020 9:35 AM Performed by: Lowella Dell, CRNA Pre-anesthesia Checklist: Patient identified, Emergency Drugs available, Suction available and Patient being monitored Patient Re-evaluated:Patient Re-evaluated prior to induction Oxygen Delivery Method: Circle System Utilized Preoxygenation: Pre-oxygenation with 100% oxygen Induction Type: IV induction and Rapid sequence Laryngoscope Size: Glidescope and 4 (elective glidescope d/t + covid) Grade View: Grade I Tube type: Oral Tube size: 7.5 mm Number of attempts: 1 Airway Equipment and Method: Video-laryngoscopy and Rigid stylet Placement Confirmation: ETT inserted through vocal cords under direct vision,  positive ETCO2 and breath sounds checked- equal and bilateral Secured at: 22 cm Tube secured with: Tape Dental Injury: Teeth and Oropharynx as per pre-operative assessment

## 2020-06-24 NOTE — Progress Notes (Signed)
CRITICAL VALUE ALERT  Critical Value:  Phosphorus <1  Date & Time Notied:  06/24/2020 0413  Provider Notified: Warren Lacy  Orders Received/Actions taken: Awaiting orders

## 2020-06-24 NOTE — Anesthesia Postprocedure Evaluation (Signed)
Anesthesia Post Note  Patient: REGAN CAVATAIO  Procedure(s) Performed: INSERTION OF TUNNELED  DIALYSIS CATHETER; Removal of temporary dialysis catheter right neck (Right Chest)     Patient location during evaluation: PACU Anesthesia Type: General Level of consciousness: awake and alert Pain management: pain level controlled Vital Signs Assessment: post-procedure vital signs reviewed and stable Respiratory status: spontaneous breathing, nonlabored ventilation, respiratory function stable and patient connected to nasal cannula oxygen Cardiovascular status: blood pressure returned to baseline, stable and tachycardic Postop Assessment: no apparent nausea or vomiting Anesthetic complications: no   No complications documented.  Last Vitals:  Vitals:   06/24/20 1045 06/24/20 1200  BP: 121/65 139/80  Pulse: (!) 106 (!) 103  Resp: (!) 24 (!) 25  Temp: 36.7 C   SpO2: 97% 97%    Last Pain:  Vitals:   06/24/20 0800  TempSrc: Oral  PainSc: 7                  Catalina Gravel

## 2020-06-24 NOTE — Transfer of Care (Signed)
Immediate Anesthesia Transfer of Care Note  Patient: Albert Harris  Procedure(s) Performed: INSERTION OF TUNNELED  DIALYSIS CATHETER (Right Chest)  Patient Location: OR 9  Anesthesia Type:General  Level of Consciousness: awake, patient cooperative and confused  Airway & Oxygen Therapy: Patient Spontanous Breathing and Patient connected to face mask oxygen  Post-op Assessment: Report given to RN and Post -op Vital signs reviewed and stable  Post vital signs: Reviewed and stable  Last Vitals:  Vitals Value Taken Time  BP 124/95   Temp    Pulse 105   Resp 22   SpO2 100     Last Pain:  Vitals:   06/24/20 0800  TempSrc: Oral  PainSc: 7       Patients Stated Pain Goal: 3 (02/58/52 7782)  Complications: No complications documented.

## 2020-06-24 NOTE — Hospital Course (Addendum)
Patient admitted to [facility] June 16, 2020 with acute hypoxic respiratory failure meeting sepsis criteria due to COVID 19.  Acute hypoxic respiratory failure 2/2 COVID-19 Patient was tachypneic in the ED and had a rapidly increasing O2 requirement.  Tested positive for COVID-19.  Chest x-ray demonstrated multifocal pneumonia and asymmetric pulmonary edema.  Patient was started on remdesivir and Decadron 6 mg daily on 1/09.  Was transferred to ICU on 1/12 given respiratory distress and worsening pain. His respiratory status was stabilized using supplemental O2 and he did not require mechanical ventilation.  Transferred out of ICU back to floor and did not require further oxygen supplementation.  Patient was discharged with home health PT & OT.  CKD 5 now ESRD Patient had previously been CKD5 and had not started HD, but had a graft placed in 10/21. On admission, patient had Cr. 15, BUN 86, and GFR 3 and was still producing urine. Nephro was consulted for urgent HD. However, his fistula was found to be infiltrated. He was started on CRRT via a RIJ temp line placed 1/12.  He was maintained on CRRT from 1/12-1/16.  He received his first HD 1/19 and was maintained on MWF HD schedule with outpatient dialysis arranged.  Acute on chronic anemia  Lower GI bleed Patient presented with hemoglobin 8.5, decreased to 8 on repeat in the setting of BRBPR.  Baseline appears to be around 10.  FOBT was negative.  He underwent colonoscopy on 1/20 with findings of a small 3 mm rectal ulcer s/p clipping and large internal/external hemorrhoids.  He received a total of 4 units pRBC to maintain Hgb threshold of 8.  He had no further episodes of bloody stool since his colonoscopy.  Severe PAD He underwent angioplasty and stenting of the right external iliac artery, right common iliac artery, and left common iliac artery on 1/14.  Patients pain greatly improved following angioplasty.  H. Flu bacteremia Patient was  noted to have beta-lactamase negative haemophilus influenza growing in his blood cultures collected on 1/9.  He was treated with 6 days of ampicillin followed by 3 days of CTX.

## 2020-06-24 NOTE — Anesthesia Preprocedure Evaluation (Addendum)
Anesthesia Evaluation  Patient identified by MRN, date of birth, ID band Patient awake    Reviewed: Allergy & Precautions, NPO status , Patient's Chart, lab work & pertinent test results  History of Anesthesia Complications (+) PROLONGED EMERGENCE and history of anesthetic complications  Airway Mallampati: II  TM Distance: >3 FB Neck ROM: Full    Dental  (+) Dental Advisory Given   Pulmonary asthma , COPD, former smoker,  covid +   breath sounds clear to auscultation       Cardiovascular hypertension, Pt. on medications and Pt. on home beta blockers + Peripheral Vascular Disease   Rhythm:Regular Rate:Normal     Neuro/Psych TIACVA    GI/Hepatic Neg liver ROS, GERD  ,  Endo/Other  negative endocrine ROS  Renal/GU ARFRenal disease     Musculoskeletal   Abdominal   Peds  Hematology  (+) Blood dyscrasia (Thrombocytopenia), anemia ,   Anesthesia Other Findings   Reproductive/Obstetrics                            Anesthesia Physical Anesthesia Plan  ASA: IV and emergent  Anesthesia Plan: General   Post-op Pain Management:    Induction: Intravenous  PONV Risk Score and Plan: 2 and Ondansetron, Dexamethasone and Treatment may vary due to age or medical condition  Airway Management Planned: Oral ETT  Additional Equipment: None  Intra-op Plan:   Post-operative Plan: Extubation in OR and Possible Post-op intubation/ventilation  Informed Consent: I have reviewed the patients History and Physical, chart, labs and discussed the procedure including the risks, benefits and alternatives for the proposed anesthesia with the patient or authorized representative who has indicated his/her understanding and acceptance.     Dental advisory given  Plan Discussed with: CRNA  Anesthesia Plan Comments:         Anesthesia Quick Evaluation

## 2020-06-24 NOTE — Progress Notes (Signed)
eLink Physician-Brief Progress Note Patient Name: Albert Harris DOB: 1956-05-14 MRN: [id number]   Date of Service  06/24/2020  HPI/Events of Note  pt continues to c/o pain behind both knees, RN reports ability to doppler pedal pulses and she has feet under warm covers but pain is behind his knees and has received oxycodone without relief.  Arterial occlusive disease Infiltrated AVF ABDOMINAL AORTOGRAM W/LOWER EXTREMITY (N/A)as a surgical intervention ( stent placement) 1/14  Acute hypoxemic respiratory failure Acute hypoxic respiratory failure, multifactorial due to volume overload/pulm edema in setting of renal failure, COVID-19 PNA  On  Room air.   eICU Interventions  - dilaudid IV once. Sq heparin. S/p CRRT.      Intervention Category Intermediate Interventions: Pain - evaluation and management  [name] 06/24/2020, 1:24 AM

## 2020-06-24 NOTE — Op Note (Signed)
    NAME: [name]    MRN: [id number] DOB: [birth year]    DATE OF OPERATION: 06/24/2020  PREOP DIAGNOSIS:    End-stage renal disease  POSTOP DIAGNOSIS:    Same  PROCEDURE:    Removal of temporary right IJ tunneled dialysis catheter Ultrasound-guided placement of right IJ tunneled dialysis catheter  SURGEON: Judeth Cornfield. Scot Dock, MD  ASSIST: None  ANESTHESIA: General  EBL: Minimal  INDICATIONS:    Albert Harris is a 65 y.o. male who has a temporary catheter and presents for placement of a tunneled dialysis catheter.  FINDINGS:   Patent right IJ.  The temporary catheter was too high in the neck to simply change the catheter over a wire.  TECHNIQUE:   The patient was taken to the operating room and received a general anesthetic.  The neck and upper chest were prepped and draped in usual sterile fashion.  The temporary catheter was quite high and posterior of the neck and therefore I did not think I could change this over a wire.  The suture was cut this catheter removed and pressure held for hemostasis.  Next I looked with ultrasound of the right IJ which was patent.  I cannulated the right IJ with a micropuncture needle under ultrasound guidance, micropuncture sheath was introduced over wire.  I then advanced the J-wire into the right atrium.  I selected the exit site for the 19 cm catheter.  The cath was brought through the tunnel.  The tract over the wire was dilated and then a dilator and peel-away sheath were advanced over the wire and the wire and dilator removed.  The catheter was passed through the peel-away sheath and positioned in the right atrium.  Both ports withdrew easily with and flushed with heparinized saline and filled with concentrated heparin.  The catheter was secured at its exit site with a 3-0 nylon suture.  The IJ cannulation site was closed with a 4-0 subcuticular stitch.  Sterile dressing was applied.  The patient tolerated the procedure well was  transferred back to the intensive care unit in stable condition.    [name], MD, FACS [facility]  DATE OF DICTATION:   06/24/2020

## 2020-06-24 NOTE — Plan of Care (Signed)
PCCM Family communication note  Clinical updates provided to wife, Bethena Roys. All questions answered.  Eliseo Gum MSN, AGACNP-BC [facility] 06/24/2020, 1:08 PM

## 2020-06-24 NOTE — Progress Notes (Signed)
[facility] Progress Note     Assessment/ Plan:   # CKD stage V--now ESRD  - setting of covid superimposed on advanced CKD. CRRT started 1/12-1/16 urgently given worsening respiratory status and timing. Remains hemodynamically stable -Plan for IHD via catheter on Tuesday provided labs are stable - RIJ temp line placed 1/12 -> needs conversion to TC as left BCF infiltrated -strict ins/outs. Appreciate VVS Dr Scot Dock trying to get ahold of the spouse to proceed with conversion to TC. -CLIP once more stable and out of ICU  # Acute hypoxic respiratory failure - Secondary to covid PNA  -per primary service: remdesivir+steroids  # Covid 19 PNA  - per primary service - optimizing volume status  # HTN  -BP acceptable, expecting drop/improvement with ultrafiltration  # Anemia of CKD - transfuse prn for hgb <7 -concern for gib w/ brbpr, surg consulted  # Secondary hyperparathyroidism  - PTH was 201 on 06/10/20. Phos >30 -significantly improved w/ crrt -stopped binder - will recheck phos in a few days on iHD   # Arterial occlusive disease -vvs on board, arteriogram 1/14 w/ intervention: anio and stent of right ext iliac artery, right common iliac artery, left common iliac art   Subjective:   He feels overall better. Denies cough, dyspnea, nausea, vomiting, CP, fever, chills.   Objective:   BP (!) 139/128 (BP Location: Right Arm)   Pulse (!) 103   Temp 97.8 F (36.6 C) (Oral)   Resp (!) 24   Ht 5\' 8"  (1.727 m)   Wt 60.3 kg   SpO2 98%   BMI 20.21 kg/m   Intake/Output Summary (Last 24 hours) at 06/24/2020 0858 Last data filed at 06/24/2020 0700 Gross per 24 hour  Intake 1960.34 ml  Output 1704 ml  Net 256.34 ml   Weight change: -1.5 kg  Physical Exam: GEN: NAD, pleasant, NCAT HEENT: No conjunctival pallor, EOMI NECK: Supple, no thyromegaly LUNGS: CTA B/L no rales CV: RRR, No M/R/G ABD: SNDNT +BS  EXT: No lower extremity edema ACCESS:  RIJ temp and left BCF   Imaging: No results found.  Labs: BMET Recent Labs  Lab 06/21/20 0446 06/21/20 1544 06/22/20 0404 06/22/20 1630 06/23/20 0408 06/23/20 1732 06/24/20 0309  NA 138 139 137 137 138 139 136  K 3.6 3.3* 3.5 3.4* 3.8 3.3* 3.6  CL 102 102 101 101 102 107 101  CO2 22 21* 25 26 26 23 23   GLUCOSE 103* 95 155* 157* 148* 114* 114*  BUN 36* 42* 32* 30* 28* 25* 42*  CREATININE 3.38* 3.55* 2.52* 1.94* 1.73* 1.39* 2.80*  CALCIUM 7.7* 7.1* 7.6* 7.9* 7.9* 7.2* 7.7*  PHOS 4.4 4.5 2.6 1.5* 1.4* 1.1* <1.0*   CBC Recent Labs  Lab 06/18/20 0343 06/18/20 1836 06/19/20 0042 06/19/20 0322 06/20/20 0457 06/21/20 0446 06/22/20 0404 06/23/20 1732  WBC 15.8*  --  7.5  --  10.7* 13.3* 16.3* 29.9*  NEUTROABS 15.6*  --  6.8  --  8.8* 12.3*  --   --   HGB 7.8*   < > 9.2*   < > 8.1* 8.1* 7.7* 8.7*  HCT 23.9*   < > 28.4*   < > 23.6* 23.1* 23.9* 27.3*  MCV 95.6  --  93.7  --  88.4 89.5 92.6 95.5  PLT 165  --  128*  --  103* 91* 90* 110*   < > = values in this interval not displayed.    Medications:    . sodium chloride  Intravenous Once  . albuterol  1-2 puff Inhalation Q6H  . amLODipine  10 mg Oral Daily  . aspirin EC  81 mg Oral Daily  . atorvastatin  10 mg Oral q1800  . chlordiazePOXIDE  5 mg Oral BID  . Chlorhexidine Gluconate Cloth  6 each Topical Q0600  . darbepoetin (ARANESP) injection - DIALYSIS  60 mcg Intravenous Q Wed-HD  . feeding supplement  1 Container Oral TID BM  . feeding supplement (NEPRO CARB STEADY)  237 mL Oral BID BM  . folic acid  1 mg Oral Daily  . heparin injection (subcutaneous)  5,000 Units Subcutaneous Q8H  . influenza vaccine adjuvanted  0.5 mL Intramuscular Tomorrow-1000  . insulin aspart  0-5 Units Subcutaneous QHS  . insulin aspart  0-9 Units Subcutaneous TID WC  . ipratropium  2 puff Inhalation Q6H  . metoprolol tartrate  25 mg Oral BID  . multivitamin  1 tablet Oral QHS  . pantoprazole  40 mg Oral Q1200  . pneumococcal 23  valent vaccine  0.5 mL Intramuscular Tomorrow-1000  . polyethylene glycol  17 g Oral Daily  . sodium chloride flush  3 mL Intravenous Q12H  . thiamine  100 mg Oral Daily      Otelia Santee, MD 06/24/2020, 8:58 AM

## 2020-06-24 NOTE — Progress Notes (Signed)
eLink Physician-Brief Progress Note Patient Name: Albert Harris DOB: 09-Jul-1955 MRN: [id number]   Date of Service  06/24/2020  HPI/Events of Note  K 3.6, Cr > 2, Po4 at 1. etoh hx.   eICU Interventions  K[facility]os protocol ordered as per ccm electrolyte replacement guidelines     Intervention Category Intermediate Interventions: Electrolyte abnormality - evaluation and management  [name] 06/24/2020, 4:20 AM

## 2020-06-24 NOTE — Progress Notes (Signed)
   VASCULAR SURGERY ASSESSMENT & PLAN:   STATUS POST BILATERAL ILIAC ANGIOPLASTY AND STENTING: Both feet are warm and well-perfused with Doppler signals in both feet now.  He is on aspirin and a statin.  Normally we would have him on Plavix also.  However, given his GI bleeding issue I have not started Plavix.  END-STAGE RENAL DISEASE: He has a temporary dialysis catheter.  I explained to the patient that he will need a tunneled dialysis catheter and he is agreeable to proceed this morning.  I tried to contact the wife but have not been able to get in contact with her yet.   SUBJECTIVE:   No specific complaints.  PHYSICAL EXAM:   Vitals:   06/24/20 0500 06/24/20 0600 06/24/20 0700 06/24/20 0800  BP: 119/77 134/67 (!) 143/74 (!) 139/128  Pulse: 97 (!) 103 (!) 108 (!) 103  Resp: 16 (!) 21 20 (!) 24  Temp:      TempSrc:    Oral  SpO2: 98% 99% 99% 98%  Weight:      Height:       Palpable femoral pulses. Anterior tibial and posterior tibial signal with the Doppler bilaterally. Both feet have good color and temperature.  LABS:   Lab Results  Component Value Date   WBC 29.9 (H) 06/23/2020   HGB 8.7 (L) 06/23/2020   HCT 27.3 (L) 06/23/2020   MCV 95.5 06/23/2020   PLT 110 (L) 06/23/2020   Lab Results  Component Value Date   CREATININE 2.80 (H) 06/24/2020   Lab Results  Component Value Date   INR 1.2 06/16/2020   CBG (last 3)  Recent Labs    06/23/20 0756 06/23/20 1709 06/23/20 2106  GLUCAP 112* 109* 146*    PROBLEM LIST:    Active Problems:   Acute respiratory disease due to COVID-19 virus   Acute renal failure superimposed on stage 5 chronic kidney disease, not on chronic dialysis ([place])   CURRENT MEDS:   . sodium chloride   Intravenous Once  . albuterol  1-2 puff Inhalation Q6H  . amLODipine  10 mg Oral Daily  . aspirin EC  81 mg Oral Daily  . atorvastatin  10 mg Oral q1800  . chlordiazePOXIDE  5 mg Oral BID  . Chlorhexidine Gluconate Cloth  6 each  Topical Q0600  . darbepoetin (ARANESP) injection - DIALYSIS  60 mcg Intravenous Q Wed-HD  . feeding supplement  1 Container Oral TID BM  . feeding supplement (NEPRO CARB STEADY)  237 mL Oral BID BM  . folic acid  1 mg Oral Daily  . heparin injection (subcutaneous)  5,000 Units Subcutaneous Q8H  . influenza vaccine adjuvanted  0.5 mL Intramuscular Tomorrow-1000  . insulin aspart  0-5 Units Subcutaneous QHS  . insulin aspart  0-9 Units Subcutaneous TID WC  . ipratropium  2 puff Inhalation Q6H  . metoprolol tartrate  25 mg Oral BID  . multivitamin  1 tablet Oral QHS  . pneumococcal 23 valent vaccine  0.5 mL Intramuscular Tomorrow-1000  . polyethylene glycol  17 g Oral Daily  . sodium chloride flush  3 mL Intravenous Q12H  . thiamine  100 mg Oral Daily    [name] Office: [phone] 06/24/2020

## 2020-06-25 ENCOUNTER — Encounter (HOSPITAL_COMMUNITY): Payer: Self-pay | Admitting: Vascular Surgery

## 2020-06-25 ENCOUNTER — Ambulatory Visit: Payer: Medicare Other

## 2020-06-25 ENCOUNTER — Encounter (HOSPITAL_COMMUNITY): Payer: Medicare Other

## 2020-06-25 DIAGNOSIS — J069 Acute upper respiratory infection, unspecified: Secondary | ICD-10-CM | POA: Diagnosis not present

## 2020-06-25 DIAGNOSIS — U071 COVID-19: Secondary | ICD-10-CM | POA: Diagnosis not present

## 2020-06-25 LAB — CBC
HCT: 22.8 % — ABNORMAL LOW (ref 39.0–52.0)
Hemoglobin: 7 g/dL — ABNORMAL LOW (ref 13.0–17.0)
MCH: 30.7 pg (ref 26.0–34.0)
MCHC: 30.7 g/dL (ref 30.0–36.0)
MCV: 100 fL (ref 80.0–100.0)
Platelets: 133 10*3/uL — ABNORMAL LOW (ref 150–400)
RBC: 2.28 MIL/uL — ABNORMAL LOW (ref 4.22–5.81)
RDW: 16.3 % — ABNORMAL HIGH (ref 11.5–15.5)
WBC: 22.9 10*3/uL — ABNORMAL HIGH (ref 4.0–10.5)
nRBC: 0.1 % (ref 0.0–0.2)

## 2020-06-25 LAB — RENAL FUNCTION PANEL
Albumin: 1.7 g/dL — ABNORMAL LOW (ref 3.5–5.0)
Anion gap: 18 — ABNORMAL HIGH (ref 5–15)
BUN: 78 mg/dL — ABNORMAL HIGH (ref 8–23)
CO2: 17 mmol/L — ABNORMAL LOW (ref 22–32)
Calcium: 6.5 mg/dL — ABNORMAL LOW (ref 8.9–10.3)
Chloride: 99 mmol/L (ref 98–111)
Creatinine, Ser: 5.43 mg/dL — ABNORMAL HIGH (ref 0.61–1.24)
GFR, Estimated: 11 mL/min — ABNORMAL LOW (ref >60)
Glucose, Bld: 111 mg/dL — ABNORMAL HIGH (ref 70–99)
Phosphorus: 5.3 mg/dL — ABNORMAL HIGH (ref 2.5–4.6)
Potassium: 4.6 mmol/L (ref 3.5–5.1)
Sodium: 134 mmol/L — ABNORMAL LOW (ref 135–145)

## 2020-06-25 LAB — GLUCOSE, CAPILLARY
Glucose-Capillary: 94 mg/dL (ref 70–99)
Glucose-Capillary: 90 mg/dL (ref 70–99)
Glucose-Capillary: 123 mg/dL — ABNORMAL HIGH (ref 70–99)
Glucose-Capillary: 143 mg/dL — ABNORMAL HIGH (ref 70–99)

## 2020-06-25 LAB — HEMOGLOBIN AND HEMATOCRIT, BLOOD
HCT: 22.5 % — ABNORMAL LOW (ref 39.0–52.0)
HCT: 23 % — ABNORMAL LOW (ref 39.0–52.0)
Hemoglobin: 7.1 g/dL — ABNORMAL LOW (ref 13.0–17.0)
Hemoglobin: 7.6 g/dL — ABNORMAL LOW (ref 13.0–17.0)

## 2020-06-25 LAB — MAGNESIUM
Magnesium: 2.6 mg/dL — ABNORMAL HIGH (ref 1.7–2.4)
Magnesium: 2.6 mg/dL — ABNORMAL HIGH (ref 1.7–2.4)

## 2020-06-25 LAB — PREPARE RBC (CROSSMATCH)

## 2020-06-25 LAB — CALCIUM, IONIZED: Calcium, Ionized, Serum: 4.5 mg/dL (ref 4.5–5.6)

## 2020-06-25 MED ORDER — SODIUM CHLORIDE 0.9% IV SOLUTION: Freq: Once | INTRAVENOUS | Status: AC

## 2020-06-25 MED ORDER — IPRATROPIUM BROMIDE HFA 17 MCG/ACT IN AERS
2.0000 | INHALATION_SPRAY | Freq: Four times a day (QID) | RESPIRATORY_TRACT | Status: DC | PRN
  Filled 2020-06-25: qty 12.9

## 2020-06-25 MED ORDER — ALBUTEROL SULFATE HFA 108 (90 BASE) MCG/ACT IN AERS: 1.0000 | INHALATION_SPRAY | Freq: Four times a day (QID) | RESPIRATORY_TRACT | Status: DC | PRN

## 2020-06-25 MED ORDER — ALBUTEROL SULFATE HFA 108 (90 BASE) MCG/ACT IN AERS
1.0000 | INHALATION_SPRAY | Freq: Four times a day (QID) | RESPIRATORY_TRACT | Status: DC | PRN
  Filled 2020-06-25: qty 6.7

## 2020-06-25 MED ORDER — IPRATROPIUM BROMIDE HFA 17 MCG/ACT IN AERS: 2.0000 | INHALATION_SPRAY | Freq: Four times a day (QID) | RESPIRATORY_TRACT | Status: DC | PRN

## 2020-06-25 MED ORDER — ATORVASTATIN CALCIUM 40 MG PO TABS
40.0000 mg | ORAL_TABLET | Freq: Every day | ORAL | Status: DC
  Administered 2020-06-25 – 2020-07-02 (×8): 40 mg via ORAL
  Filled 2020-06-25 (×8): qty 1

## 2020-06-25 NOTE — Progress Notes (Addendum)
Family Medicine Teaching Service Daily Progress Note Intern Pager: [phone]  Patient name: Albert Harris Medical record number: [id number] Date of birth: 08-Oct-1955 Age: 65 y.o. Gender: male  Primary Care Provider: Kerin Perna, NP Consultants: VVS, nephrology, GI Code Status: Full  Pt Overview and Major Events to Date:  1/9 admitted covid + 1/11 BCx pos for HFlu; transfused for Hgb 7.8 + BRBPR 1/12 Transferred to ICU 1/14 abdominal aortogram with stent 1/17 Tunneled IJ cath placed 1/18 transferred out of the ICU to [place] and Plan:  CKD5 now ESRD: Has been receiving CRRT 1/12-1/16. Had Tunneled catheter placed by VVS yesterday. Scheduled for Harris dialysis today. RFP with electrolyte derangements c/w ESRD, persistent AGMA.  -Nephro recommendations Albert Harris dialysis today.  - Renal diet - Daily RFP, mag  GI Bleed: Patient previously with BPBPR, received PRBC on 1/11.  Hgb had been stable at 8.7, down to 7.0 today. Pt denies any knowledge of blood in stool. On ASA for arterial occusive disease s/p stent placement (see below). Underwent abdominal aortogram on 1/14.  - GI consulted, GI called back shortly thereafter and stated that the patient follows with Albert Harris with [facility] and that they had forwarded the consult to him. - Continue holding Plavix - 1U PRBC  - pm H&H post-transfusion   HFlu Bacteremia:  Transitioned from ampicillin to ceftriaxone.  - Day 7/8 IV abx. - Last dose of Ceftriaxone tomorrow   Arterial Occlusive Disease: S/p angioplasty and stenting of R external iliac artery, R common iliac artery & L common iliac artery. No pain today. Some ischemic darkening of R toes. R toes cool to touch. DP pulse weakly perceptible on R side. Sensation intact. Stable from a vascular standpoint per VVS note.  - VVS following, appreciate recs.  - Hold Plavix due to GI bleed - Increase Lipitor to '40mg'$  daily  AHRF 2/2 COVID-19-  Resolved: Breathing comfortably on room air. S/p remdesivir & steroids. - Can remove COVID precautions tomorrow   HFpEF  HTN:  Intermittently hypertensive to the 0000000 systolic.  - Continue Metoprolol tartrate '25mg'$  BID  FEN/GI: Renal diet PPx: Heparin   Status is: Inpatient  Remains inpatient appropriate because:Inpatient level of care appropriate due to severity of illness   Dispo: The patient is from: Home              Anticipated d/c is to: Home              Anticipated d/c date is: 2 days              Patient currently is not medically stable to d/c.  Subjective:  Albert Harris reports feeling well this morning. He has no acute complaints at this time. Denies any abdominal pain/cramping, bloody stool or loose stool. No SOB, CP, or surgical site pain from either his iliac stents or his IJ catheter placement. His primary concern at this point is when he can see his wife.   Objective: Temp:  [97.9 F (36.6 C)-100 F (37.8 C)] 100 F (37.8 C) (01/17 1934) Pulse Rate:  [81-108] 81 (01/17 2300) Resp:  [17-28] 21 (01/17 2300) BP: (105-150)/(59-128) 133/63 (01/17 2300) SpO2:  [97 %-100 %] 99 % (01/18 0133) FiO2 (%):  [21 %] 21 % (01/18 0133) Physical Exam: General: Tired-appearing, chronically ill, resting in bed Cardiovascular: RRR, no M/r/g Respiratory: Lungs CTAB Abdomen: Soft, non-tender, non-distended, bowel sounds nl Extremities: Trace LE edema bilaterally, sensation intact through bilateral feet, darkening of skin of  R toes consistent with ischemic changes, R toes cold to touch compared to L. PT pulses 1+ bilaterally. DP pulse 1+ on left, barely perceptible on R.   Laboratory: Recent Labs  Lab 06/21/20 0446 06/22/20 0404 06/23/20 1732  WBC 13.3* 16.3* 29.9*  HGB 8.1* 7.7* 8.7*  HCT 23.1* 23.9* 27.3*  PLT 91* 90* 110*   Recent Labs  Lab 06/20/20 0214 06/20/20 0456 06/21/20 0446 06/21/20 1544 06/22/20 0404 06/22/20 1630 06/24/20 0309 06/24/20 1552  06/25/20 0257  NA 140   < > 138   < > 137   < > 136 134* 134*  K 4.3   < > 3.6   < > 3.5   < > 3.6 4.7 4.6  CL 102   < > 102   < > 101   < > 101 99 99  CO2 19*   < > 22   < > 25   < > 23 19* 17*  BUN 83*   < > 36*   < > 32*   < > 42* 57* 78*  CREATININE 8.54*   < > 3.38*   < > 2.52*   < > 2.80* 4.33* 5.43*  CALCIUM 7.2*   < > 7.7*   < > 7.6*   < > 7.7* 6.8* 6.5*  PROT 5.0*  --  4.9*  --  4.7*  --   --   --   --   BILITOT 0.8  --  0.9  --  0.7  --   --   --   --   ALKPHOS 52  --  54  --  63  --   --   --   --   ALT 25  --  26  --  24  --   --   --   --   AST 41  --  66*  --  49*  --   --   --   --   GLUCOSE 103*   < > 103*   < > 155*   < > 114* 163* 111*   < > = values in this interval not displayed.     Imaging/Diagnostic Tests: CXR yesterday confirming position of R IJ dialysis catheter with tips overlying mid SVC.  No pneumothorax Minimally improved aeration of the lung bases, left greater than right, otherwise, similar findings compatible with provided history of COVID 19 pneumonia.  Albert Harris, Medical Student 06/25/2020, 6:55 AM   Resident attestation: I agree with the documentation of Student Dr. Joelyn Oms above. I have made adjustments to his note as appropriate. I have seen the patient and performed physical exam on the patient consistent with his documented physical exam above.  Lurline Del, DO

## 2020-06-25 NOTE — Progress Notes (Signed)
Interim progress note  Patient was found to have hemoglobin of 7 this morning, he was transfused 1 unit today, posttransfusion H&H 7.6.  Earlier on in the patient's admission to the hospital, due to his peripheral artery disease?,  [name] transfusion threshold was less than 8.  I spoke with Dr. Scot Dock from vascular surgery who agreed with transfusion threshold of less than 8.  However, after speaking with Dr. Posey Pronto with nephrology, he informed me that there is currently a blood shortage and therefore the criteria to be transfused has grown stricter (hemoglobin less than 7).  Current plan is to check CBC in the morning on 06/26/2020.  Patient is scheduled for a session of HD on 06/26/2020.  If hemoglobin in the morning is less than 7 patient can receive blood with his dialysis.  Milus Banister, [place], PGY-3 06/25/2020 8:04 PM

## 2020-06-25 NOTE — Consult Note (Incomplete)
[facility] Consult: 2:22 PM 06/25/2020  LOS: 9 days    Referring Provider: ***  Primary Care Physician:  Kerin Perna, NP Primary Gastroenterologist:  Dr. Marland Kitchen     Reason for Consultation:  ***   HPI: Albert Harris is a 65 y.o. male.  ***  Hgb 8.7 >> 7.1 over the last 48 hours. In summer, fall 2021 was 9.8-11.6 Thrombocytopenia with platelets improved from 91 >> 133. INR 1.2.    Past Medical History:  Diagnosis Date  . Asthma   . Carotid stenosis 09/12/2019  . Claudication in peripheral vascular disease ([place]) 09/12/2019  . Complication of anesthesia    " i HAVE A HARD TIME WAKING UP "  . COPD (chronic obstructive pulmonary disease) ([place])   . GERD (gastroesophageal reflux disease)   . HLD (hyperlipidemia)   . Hypertension   . PFO with atrial septal aneurysm    by echo 11/2017  . Stroke ([place])   . [name] (transient ischemic attack) 11/2017  . Tobacco abuse    quit 07/17/19    Past Surgical History:  Procedure Laterality Date  . ABDOMINAL AORTOGRAM W/LOWER EXTREMITY N/A 06/21/2020   Procedure: ABDOMINAL AORTOGRAM W/LOWER EXTREMITY;  Surgeon: Angelia Mould, MD;  Location: [facility];  Service: Cardiovascular;  Laterality: N/A;  . AV FISTULA PLACEMENT Left 03/28/2020   Procedure: ARTERIOVENOUS (AV) FISTULA CREATION LEFT;  Surgeon: [name], MD;  Location: [facility];  Service: Vascular;  Laterality: Left;  . COLONOSCOPY    . FINGER SURGERY    . INSERTION OF DIALYSIS CATHETER Right 06/24/2020   Procedure: INSERTION OF TUNNELED  DIALYSIS CATHETER; Removal of temporary dialysis catheter right neck;  Surgeon: Angelia Mould, MD;  Location: [facility];  Service: Vascular;  Laterality: Right;  . PERIPHERAL VASCULAR INTERVENTION Right 06/21/2020   Procedure: PERIPHERAL  VASCULAR INTERVENTION;  Surgeon: Angelia Mould, MD;  Location: [facility];  Service: Cardiovascular;  Laterality: Right;    Prior to Admission medications   Medication Sig Start Date End Date Taking? Authorizing Provider  acetaminophen (TYLENOL) 650 MG CR tablet Take 1,300 mg by mouth every 8 (eight) hours as needed for pain or fever.   Yes [provider]  albuterol (VENTOLIN HFA) 108 (90 Base) MCG/ACT inhaler Inhale 1-2 puffs into the lungs every 6 (six) hours as needed for wheezing or shortness of breath.   Yes [provider]  amLODipine (NORVASC) 10 MG tablet Take 1 tablet (10 mg total) by mouth daily. 08/30/19  Yes Kerin Perna, NP  furosemide (LASIX) 20 MG tablet Take 20 mg by mouth daily as needed for fluid.    Yes [provider]  metoprolol tartrate (LOPRESSOR) 25 MG tablet Take 25 mg by mouth 2 (two) times daily. 04/23/20  Yes [provider]  nitroGLYCERIN (NITROSTAT) 0.4 MG SL tablet Place 1 tablet (0.4 mg total) under the tongue every 5 (five) minutes x 3 doses as needed for chest pain. Patient taking differently: Place 0.4 mg under the tongue every 5 (five) minutes  as needed for chest pain. 07/23/19  Yes Aline August, MD  sodium bicarbonate 650 MG tablet Take 650 mg by mouth daily as needed for heartburn. 02/29/20  Yes [provider]    Scheduled Meds: . sodium chloride   Intravenous Once  . albuterol  1-2 puff Inhalation Q6H  . amLODipine  10 mg Oral Daily  . aspirin EC  81 mg Oral Daily  . atorvastatin  40 mg Oral q1800  . Chlorhexidine Gluconate Cloth  6 each Topical Q0600  . darbepoetin (ARANESP) injection - DIALYSIS  60 mcg Intravenous Q Wed-HD  . feeding supplement  1 Container Oral TID BM  . feeding supplement (NEPRO CARB STEADY)  237 mL Oral BID BM  . heparin injection (subcutaneous)  5,000 Units Subcutaneous Q8H  . influenza vaccine adjuvanted  0.5 mL Intramuscular Tomorrow-1000  . ipratropium  2  puff Inhalation Q6H  . metoprolol tartrate  25 mg Oral BID  . multivitamin  1 tablet Oral QHS  . pantoprazole  40 mg Oral Q1200  . pneumococcal 23 valent vaccine  0.5 mL Intramuscular Tomorrow-1000  . polyethylene glycol  17 g Oral Daily  . sodium chloride flush  3 mL Intravenous Q12H   Infusions: . sodium chloride Stopped (06/24/20 0900)  . sodium chloride    . cefTRIAXone (ROCEPHIN)  IV 2 g (06/25/20 0953)   PRN Meds: sodium chloride, sodium chloride, acetaminophen, guaiFENesin, heparin, lip balm, ondansetron (ZOFRAN) IV, oxyCODONE, phenol, sodium chloride flush   Allergies as of 06/16/2020  . (No Known Allergies)    Family History  Problem Relation Age of Onset  . Hypertension Mother   . Colon cancer Father   . Stomach cancer Brother     Social History   Socioeconomic History  . Marital status: Divorced    Spouse name: Not on file  . Number of children: Not on file  . Years of education: Not on file  . Highest education level: Not on file  Occupational History  . Not on file  Tobacco Use  . Smoking status: Former Smoker    Packs/day: 1.00    Years: 50.00    Pack years: 50.00    Types: Cigarettes    Quit date: 07/17/2019    Years since quitting: 0.9  . Smokeless tobacco: Never Used  Vaping Use  . Vaping Use: Never used  Substance and Sexual Activity  . Alcohol use: Yes    Alcohol/week: 1.0 - 3.0 standard drink    Types: 1 - 3 Cans of beer per week  . Drug use: No  . Sexual activity: Not Currently  Other Topics Concern  . Not on file  Social History Narrative   Married   Works in Audiological scientist   Social Determinants of Health   Financial Resource Strain: Low Risk   . Difficulty of Paying Living Expenses: Not hard at all  Food Insecurity: No Food Insecurity  . Worried About Charity fundraiser in the Last Year: Never true  . Ran Out of Food in the Last Year: Never true  Transportation Needs: No Transportation Needs  . Lack of  Transportation (Medical): No  . Lack of Transportation (Non-Medical): No  Physical Activity: Not on file  Stress: No Stress Concern Present  . Feeling of Stress : Not at all  Social Connections: Not on file  Intimate Partner Violence: Not on file    REVIEW OF SYSTEMS: Constitutional:  *** ENT:  No nose bleeds Pulm:  ***  CV:  No palpitations, no LE edema.  GU:  No hematuria, no frequency GI:  *** Heme:  ***   Transfusions:  *** Neuro:  No headaches, no peripheral tingling or numbness Derm:  No itching, no rash or sores.  Endocrine:  No sweats or chills.  No polyuria or dysuria Immunization:  *** Travel:  None beyond local counties in last few months.    PHYSICAL EXAM: Vital signs in last 24 hours: Vitals:   06/25/20 1300 06/25/20 1342  BP: (!) 143/82 134/73  Pulse: (!) 101 98  Resp: 20 20  Temp: 98.8 F (37.1 C) 98.9 F (37.2 C)  SpO2: 96% 97%   Wt Readings from Last 3 Encounters:  06/24/20 60.3 kg  05/14/20 69.6 kg  03/28/20 72.1 kg    General: *** Head:  ***  Eyes:  *** Ears:  ***  Nose:  *** Mouth:  *** Neck:  *** Lungs:  *** Heart: *** Abdomen:  ***.   Rectal: ***   Musc/Skeltl: *** Extremities:  ***  Neurologic:  *** Skin:  *** Tattoos:  *** Nodes:  ***   Psych:  ***  Intake/Output from previous day: 01/17 0701 - 01/18 0700 In: 1789.6 [P.O.:1197; I.V.:270.1; IV Piggyback:322.5] Out: 10 [Blood:10] Intake/Output this shift: Total I/O In: 30 [P.O.:30] Out: -   LAB RESULTS: Recent Labs    06/23/20 1732 06/25/20 0843 06/25/20 1015  WBC 29.9* 22.9*  --   HGB 8.7* 7.0* 7.1*  HCT 27.3* 22.8* 22.5*  PLT 110* 133*  --    BMET Lab Results  Component Value Date   NA 134 (L) 06/25/2020   NA 134 (L) 06/24/2020   NA 136 06/24/2020   K 4.6 06/25/2020   K 4.7 06/24/2020   K 3.6 06/24/2020   CL 99 06/25/2020   CL 99 06/24/2020   CL 101 06/24/2020   CO2 17 (L) 06/25/2020   CO2 19 (L) 06/24/2020   CO2 23 06/24/2020   GLUCOSE 111 (H)  06/25/2020   GLUCOSE 163 (H) 06/24/2020   GLUCOSE 114 (H) 06/24/2020   BUN 78 (H) 06/25/2020   BUN 57 (H) 06/24/2020   BUN 42 (H) 06/24/2020   CREATININE 5.43 (H) 06/25/2020   CREATININE 4.33 (H) 06/24/2020   CREATININE 2.80 (H) 06/24/2020   CALCIUM 6.5 (L) 06/25/2020   CALCIUM 6.8 (L) 06/24/2020   CALCIUM 7.7 (L) 06/24/2020   LFT Recent Labs    06/24/20 0309 06/24/20 1552 06/25/20 0257  ALBUMIN 1.6* 1.6* 1.7*   PT/INR Lab Results  Component Value Date   INR 1.2 06/16/2020   INR 0.88 11/09/2017   INR 0.84 12/24/2016   Hepatitis Panel No results for input(s): HEPBSAG, HCVAB, HEPAIGM, HEPBIGM in the last 72 hours. C-Diff No components found for: CDIFF Lipase  No results found for: LIPASE  Drugs of Abuse     Component Value Date/Time   LABOPIA NONE DETECTED 07/18/2019 0603   COCAINSCRNUR NONE DETECTED 07/18/2019 0603   LABBENZ NONE DETECTED 07/18/2019 0603   AMPHETMU NONE DETECTED 07/18/2019 0603   THCU NONE DETECTED 07/18/2019 0603   LABBARB NONE DETECTED 07/18/2019 0603     RADIOLOGY STUDIES: DG Chest Portable 1 View  Result Date: 06/24/2020 CLINICAL DATA:  Post dialysis catheter placement. EXAM: PORTABLE CHEST 1 VIEW COMPARISON:  06/22/2020 FINDINGS: Grossly unchanged enlarged cardiac silhouette and mediastinal contours. Redemonstrated diffuse slightly nodular interstitial airspace opacities with perihilar and upper lung predominance. Minimally improved aeration of the bilateral lung bases, left greater than right. No  pleural effusion or pneumothorax. No new focal airspace opacities. No pleural effusion. Interval placement of right jugular approach dialysis catheter with tips overlying the mid SVC. No pneumothorax. No acute osseous abnormalities. IMPRESSION: 1. Interval placement of right jugular approach dialysis catheter with tips overlying the mid SVC. No pneumothorax. 2. Minimally improved aeration of the lung bases, left greater than right, otherwise, similar  findings compatible with provided history of COVID 19 pneumonia. Electronically Signed   By: Sandi Mariscal M.D.   On: 06/24/2020 10:35   DG Fluoro Guide CV Line-No Report  Result Date: 06/24/2020 Fluoroscopy was utilized by the requesting physician.  No radiographic interpretation.    ENDOSCOPIC STUDIES: ***  IMPRESSION:   *   Anemia.  Of CKD .    *   New ESRD.  CRRT initiated in ICU now on MWF hemodialysis schedule.   S/p temporary dialysis catheter 1/17.  *   Arterial occlusive disease. 1/14 angiography with right external iliac, right common iliac, left common iliac stent.  *    COVID-19 pneumonia with associated acute hypoxia, respiratory failure. Treated with remdesivir, steroids.    PLAN:     ***   Azucena Freed  06/25/2020, 2:22 PM Phone [phone]

## 2020-06-25 NOTE — Consult Note (Signed)
Reason for Consult: Hematochezia and anemia Referring Physician: Family Medicine  [name] HPI: This is a 65 year old male with a PMH of Stage V ESRD, progressive anemia since 07/2019, HFpEF, HTN, [name],COPD, and PAD admitted for complaints of SOB.  He was noted to be hypoxemic and he required 6 liters of [place] to maintain saturations between 94-99%.  Testing revealed that he was COVID positive.  Since that time his medical issues were managed and he was noted to have a worsening baseline anemia.  The patient complained about painless hematochezia intermittently for the past 1-2 months.  His last colonoscopy was 08/22/2004 and it was a normal examination.  During this hospitalization dialysis was initiated as his creatinine worsened.  His COVID status improved with treatment and he is no longer on oxygen supplementation.  He is also on antibiotics for a finding of bacteremia.  Past Medical History:  Diagnosis Date  . Asthma   . Carotid stenosis 09/12/2019  . Claudication in peripheral vascular disease ([place]) 09/12/2019  . Complication of anesthesia    " i HAVE A HARD TIME WAKING UP "  . COPD (chronic obstructive pulmonary disease) ([place])   . GERD (gastroesophageal reflux disease)   . HLD (hyperlipidemia)   . Hypertension   . PFO with atrial septal aneurysm    by echo 11/2017  . Stroke ([place])   . [name] (transient ischemic attack) 11/2017  . Tobacco abuse    quit 07/17/19    Past Surgical History:  Procedure Laterality Date  . ABDOMINAL AORTOGRAM W/LOWER EXTREMITY N/A 06/21/2020   Procedure: ABDOMINAL AORTOGRAM W/LOWER EXTREMITY;  Surgeon: Angelia Mould, MD;  Location: [facility];  Service: Cardiovascular;  Laterality: N/A;  . AV FISTULA PLACEMENT Left 03/28/2020   Procedure: ARTERIOVENOUS (AV) FISTULA CREATION LEFT;  Surgeon: [name], MD;  Location: [facility];  Service: Vascular;  Laterality: Left;  . COLONOSCOPY    . FINGER SURGERY    . INSERTION OF DIALYSIS CATHETER  Right 06/24/2020   Procedure: INSERTION OF TUNNELED  DIALYSIS CATHETER; Removal of temporary dialysis catheter right neck;  Surgeon: Angelia Mould, MD;  Location: [facility];  Service: Vascular;  Laterality: Right;  . PERIPHERAL VASCULAR INTERVENTION Right 06/21/2020   Procedure: PERIPHERAL VASCULAR INTERVENTION;  Surgeon: Angelia Mould, MD;  Location: [facility];  Service: Cardiovascular;  Laterality: Right;    Family History  Problem Relation Age of Onset  . Hypertension Mother   . Colon cancer Father   . Stomach cancer Brother     Social History:  reports that he quit smoking about 11 months ago. His smoking use included cigarettes. He has a 50.00 pack-year smoking history. He has never used smokeless tobacco. He reports current alcohol use of about 1.0 - 3.0 standard drink of alcohol per week. He reports that he does not use drugs.  Allergies: No Known Allergies  Medications:  Scheduled: . sodium chloride   Intravenous Once  . amLODipine  10 mg Oral Daily  . aspirin EC  81 mg Oral Daily  . atorvastatin  40 mg Oral q1800  . Chlorhexidine Gluconate Cloth  6 each Topical Q0600  . darbepoetin (ARANESP) injection - DIALYSIS  60 mcg Intravenous Q Wed-HD  . feeding supplement  1 Container Oral TID BM  . heparin injection (subcutaneous)  5,000 Units Subcutaneous Q8H  . influenza vaccine adjuvanted  0.5 mL Intramuscular Tomorrow-1000  . metoprolol tartrate  25 mg Oral BID  . multivitamin  1 tablet Oral QHS  . pantoprazole  40 mg Oral Q1200  . pneumococcal 23 valent vaccine  0.5 mL Intramuscular Tomorrow-1000  . polyethylene glycol  17 g Oral Daily  . sodium chloride flush  3 mL Intravenous Q12H   Continuous: . sodium chloride Stopped (06/24/20 0900)  . sodium chloride    . cefTRIAXone (ROCEPHIN)  IV 2 g (06/25/20 [id number])    Results for orders placed or performed during the hospital encounter of 06/16/20 (from the past 24 hour(s))  Glucose, capillary     Status:  Abnormal   Collection Time: 06/24/20  5:03 PM  Result Value Ref Range   Glucose-Capillary 111 (H) 70 - 99 mg/dL  phosphorus level     Status: Abnormal   Collection Time: 06/24/20  7:56 PM  Result Value Ref Range   Phosphorus 4.7 (H) 2.5 - 4.6 mg/dL  Glucose, capillary     Status: Abnormal   Collection Time: 06/24/20  9:27 PM  Result Value Ref Range   Glucose-Capillary 188 (H) 70 - 99 mg/dL  Renal function panel (daily at 0500)     Status: Abnormal   Collection Time: 06/25/20  2:57 AM  Result Value Ref Range   Sodium 134 (L) 135 - 145 mmol/L   Potassium 4.6 3.5 - 5.1 mmol/L   Chloride 99 98 - 111 mmol/L   CO2 17 (L) 22 - 32 mmol/L   Glucose, Bld 111 (H) 70 - 99 mg/dL   BUN 78 (H) 8 - 23 mg/dL   Creatinine, Ser 5.43 (H) 0.61 - 1.24 mg/dL   Calcium 6.5 (L) 8.9 - 10.3 mg/dL   Phosphorus 5.3 (H) 2.5 - 4.6 mg/dL   Albumin 1.7 (L) 3.5 - 5.0 g/dL   GFR, Estimated 11 (L) >60 mL/min   Anion gap 18 (H) 5 - 15  Magnesium     Status: Abnormal   Collection Time: 06/25/20  2:57 AM  Result Value Ref Range   Magnesium 2.6 (H) 1.7 - 2.4 mg/dL  Magnesium     Status: Abnormal   Collection Time: 06/25/20  8:12 AM  Result Value Ref Range   Magnesium 2.6 (H) 1.7 - 2.4 mg/dL  Glucose, capillary     Status: None   Collection Time: 06/25/20  8:22 AM  Result Value Ref Range   Glucose-Capillary 94 70 - 99 mg/dL  CBC     Status: Abnormal   Collection Time: 06/25/20  8:43 AM  Result Value Ref Range   WBC 22.9 (H) 4.0 - 10.5 K/uL   RBC 2.28 (L) 4.22 - 5.81 MIL/uL   Hemoglobin 7.0 (L) 13.0 - 17.0 g/dL   HCT 22.8 (L) 39.0 - 52.0 %   MCV 100.0 80.0 - 100.0 fL   MCH 30.7 26.0 - 34.0 pg   MCHC 30.7 30.0 - 36.0 g/dL   RDW 16.3 (H) 11.5 - 15.5 %   Platelets 133 (L) 150 - 400 K/uL   nRBC 0.1 0.0 - 0.2 %  Hemoglobin and hematocrit, blood     Status: Abnormal   Collection Time: 06/25/20 10:15 AM  Result Value Ref Range   Hemoglobin 7.1 (L) 13.0 - 17.0 g/dL   HCT 22.5 (L) 39.0 - 52.0 %  Type and  screen [place]     Status: None (Preliminary result)   Collection Time: 06/25/20 11:09 AM  Result Value Ref Range   ABO/RH(D) O POS    Antibody Screen NEG    Sample Expiration 06/28/2020,2359  Unit Number [id number]    Blood Component Type RED CELLS,LR    Unit division 00    Status of Unit ISSUED    Transfusion Status OK TO TRANSFUSE    Crossmatch Result      Compatible Performed at [facility], [place] [address] [place], [place] [address]   Prepare RBC (crossmatch)     Status: None   Collection Time: 06/25/20 11:09 AM  Result Value Ref Range   Order Confirmation      ORDER PROCESSED BY BLOOD BANK Performed at [facility], [place] [address] [place], [place] [address]   Glucose, capillary     Status: None   Collection Time: 06/25/20 12:12 PM  Result Value Ref Range   Glucose-Capillary 90 70 - 99 mg/dL  Glucose, capillary     Status: Abnormal   Collection Time: 06/25/20  4:39 PM  Result Value Ref Range   Glucose-Capillary 123 (H) 70 - 99 mg/dL     DG Chest Portable 1 View  Result Date: 06/24/2020 CLINICAL DATA:  Post dialysis catheter placement. EXAM: PORTABLE CHEST 1 VIEW COMPARISON:  06/22/2020 FINDINGS: Grossly unchanged enlarged cardiac silhouette and mediastinal contours. Redemonstrated diffuse slightly nodular interstitial airspace opacities with perihilar and upper lung predominance. Minimally improved aeration of the bilateral lung bases, left greater than right. No pleural effusion or pneumothorax. No new focal airspace opacities. No pleural effusion. Interval placement of right jugular approach dialysis catheter with tips overlying the mid SVC. No pneumothorax. No acute osseous abnormalities. IMPRESSION: 1. Interval placement of right jugular approach dialysis catheter with tips overlying the mid SVC. No pneumothorax. 2. Minimally improved aeration of the lung bases, left greater than right, otherwise, similar findings compatible with  provided history of COVID 19 pneumonia. Electronically Signed   By: Sandi Mariscal M.D.   On: 06/24/2020 10:35   DG Fluoro Guide CV Line-No Report  Result Date: 06/24/2020 Fluoroscopy was utilized by the requesting physician.  No radiographic interpretation.    ROS:  As stated above in the HPI otherwise negative.  Blood pressure (!) 120/56, pulse 93, temperature 97.7 F (36.5 C), temperature source Oral, resp. rate 17, height '5\' 8"'$  (1.727 m), weight 60.3 kg, SpO2 96 %.    PE: Gen: NAD, Alert and Oriented, tachypenic HEENT:  [place]/AT, EOMI Neck: Supple, no LAD Lungs: CTA Bilaterally CV: RRR without M/G/R ABD: Soft, NTND, +BS Ext: No C/C/E  Assessment/Plan: 1) Anemia. 2) Hematochezia. 3) ESRD. 4) Tachypena s/p COVID 19 infection.   Further evaluation with a colonoscopy is needed, but he is tachypneic.  Even though he denies any SOB, just with minimal conversation his RR is up at 95.  His baseline appears to be in the mid 30's.  His bleeding appears to be from a lower GI source and it is most likely a hemorrhoidal source.  There was an acute drop in his HGB at the time of admission, but it is not clear if his hematochezia was the sole source.  Reviewing his blood work over the past 10 years his anemia started when his creatinine increased.  Currently there is no urgency to a colonoscopy and the prudent course is to perform the procedure when his tachypnea resolves.  The procedure may be performed in the hospital or as an outpatient in the near future.   Plan: 1) Continue supportive care. 2) Colonoscopy when his tachypnea normalizes. 3) Transfuse as necessary. 4) GI will follow.  Emersen Mascari D 06/25/2020, 4:52 PM

## 2020-06-25 NOTE — Progress Notes (Signed)
Attempt to call 5W for report, nurse to call back. Modena Morrow E, RN 06/25/2020 10:03 AM

## 2020-06-25 NOTE — Progress Notes (Addendum)
Progress Note    06/25/2020 8:43 AM 1 Day Post-Op  Subjective:  No complaints   Vitals:   06/25/20 0800 06/25/20 0833  BP: (!) 146/72   Pulse: 95 99  Resp: 17 (!) 22  Temp:  99 F (37.2 C)  SpO2: 99% 97%   Physical Exam: Cardiac:  Regular. Right IJ TDC in place. No swelling along tunneling. Dressings c/d/i Lungs: non labored Extremities:  Well perfused and feet warm. Right toes cool. 2+ radial pulses bilaterally. 2+ femoral pulses bilaterally. Doppler Right PT> AT. Doppler left AT/ PT. Ischemic changes to right 1st, 2nd and 4th toes Neurologic: alert and oriented  CBC    Component Value Date/Time   WBC 29.9 (H) 06/23/2020 1732   RBC 2.86 (L) 06/23/2020 1732   HGB 8.7 (L) 06/23/2020 1732   HGB 14.5 12/07/2017 1417   HCT 27.3 (L) 06/23/2020 1732   HCT 42.4 12/07/2017 1417   PLT 110 (L) 06/23/2020 1732   PLT 338 12/07/2017 1417   MCV 95.5 06/23/2020 1732   MCV 92 12/07/2017 1417   MCH 30.4 06/23/2020 1732   MCHC 31.9 06/23/2020 1732   RDW 16.3 (H) 06/23/2020 1732   RDW 13.2 12/07/2017 1417   LYMPHSABS 0.4 (L) 06/21/2020 0446   LYMPHSABS 3.0 12/07/2017 1417   MONOABS 0.6 06/21/2020 0446   EOSABS 0.0 06/21/2020 0446   EOSABS 0.4 12/07/2017 1417   BASOSABS 0.0 06/21/2020 0446   BASOSABS 0.1 12/07/2017 1417    BMET    Component Value Date/Time   NA 134 (L) 06/25/2020 0257   NA 140 12/07/2017 1417   K 4.6 06/25/2020 0257   CL 99 06/25/2020 0257   CO2 17 (L) 06/25/2020 0257   GLUCOSE 111 (H) 06/25/2020 0257   BUN 78 (H) 06/25/2020 0257   BUN 26 12/07/2017 1417   CREATININE 5.43 (H) 06/25/2020 0257   CREATININE 1.15 02/19/2017 1049   CALCIUM 6.5 (L) 06/25/2020 0257   CALCIUM 8.5 (L) 06/10/2020 0900   GFRNONAA 11 (L) 06/25/2020 0257   GFRAA 6 (L) 03/04/2020 1045    INR    Component Value Date/Time   INR 1.2 06/16/2020 1404     Intake/Output Summary (Last 24 hours) at 06/25/2020 0843 Last data filed at 06/24/2020 2200 Gross per 24 hour  Intake  1789.58 ml  Output 10 ml  Net 1779.58 ml     Assessment/Plan:  65 y.o. male is s/p  3.  Aortogram with bilateral iliac arteriogram and bilateral lower extremity runoff 4.  Selective catheterization of the right external iliac artery (second order catheterization) 5.  Angioplasty and stenting of the right external iliac artery (6 X 5 Viabahn X2) 6. Angioplasty and stenting of the right common iliac artery (7 X 39 VBX) 7.  Angioplasty and stenting of the left common iliac artery (8 X 39 VBX) 4 Days Post-Op. Right IJ TDC 1 Day post op.   Doing well following placement of TDC. Minimal Pain. Bilateral lower extremities warm and well perfused. Some ischemic changes to right toes. Will need to continue to monitor. Doppler signals present in both feet. Continue Aspirin and Statin. No Plavix due to GI bleed. Stable from vascular standpoint  DVT prophylaxis:  Sq heparin   Karoline Caldwell, PA-C [facility] [phone] 06/25/2020 8:43 AM   I have interviewed the patient and examined the patient. I agree with the findings by the PA. He has bilat SFA occlusions but is currently not a candidate for an aggressive approach to  this.    [name], MD [phone]

## 2020-06-25 NOTE — Progress Notes (Signed)
[facility] Progress Note     Assessment/ Plan:   # CKD stage V--now ESRD  - setting of covid superimposed on advanced CKD. CRRT started 1/12-1/16 urgently given worsening respiratory status and timing. Remains hemodynamically stable - For HD On MWF schedule COVID shift - f/u CLIP - Maturing AVF, using TDC VVS placed  # Acute hypoxic respiratory failure - Secondary to covid PNA  -per primary service: remdesivir+steroids; improved  # Covid 19 PNA  - per primary service  # HTN  -BP acceptable, expecting drop/improvement with ultrafiltration  # Anemia of CKD - transfuse prn for hgb <7, cont ESA  # Secondary hyperparathyroidism  - PTH was 201 on 06/10/20.P < 3 no binder, encourage PO  # Arterial occlusive disease -vvs on board, arteriogram 1/14 w/ intervention: anio and stent of right ext iliac artery, right common iliac artery, left common iliac art   Subjective:     Out of ICU   Objective:   BP (!) 143/82   Pulse (!) 101   Temp 98.8 F (37.1 C) (Oral)   Resp 20   Ht '5\' 8"'$  (1.727 m)   Wt 60.3 kg   SpO2 96%   BMI 20.21 kg/m   Intake/Output Summary (Last 24 hours) at 06/25/2020 1338 Last data filed at 06/25/2020 0900 Gross per 24 hour  Intake 1086.83 ml  Output --  Net 1086.83 ml   Weight change:   Physical Exam: GEN: NAD, pleasant, NCAT HEENT: No conjunctival pallor, EOMI NECK: Supple, no thyromegaly LUNGS: CTA B/L no rales CV: RRR, No M/R/G ABD: SNDNT +BS  EXT: No lower extremity edema ACCESS: RIJ TDC and left BCF   Imaging: DG Chest Portable 1 View  Result Date: 06/24/2020 CLINICAL DATA:  Post dialysis catheter placement. EXAM: PORTABLE CHEST 1 VIEW COMPARISON:  06/22/2020 FINDINGS: Grossly unchanged enlarged cardiac silhouette and mediastinal contours. Redemonstrated diffuse slightly nodular interstitial airspace opacities with perihilar and upper lung predominance. Minimally improved aeration of the bilateral lung bases,  left greater than right. No pleural effusion or pneumothorax. No new focal airspace opacities. No pleural effusion. Interval placement of right jugular approach dialysis catheter with tips overlying the mid SVC. No pneumothorax. No acute osseous abnormalities. IMPRESSION: 1. Interval placement of right jugular approach dialysis catheter with tips overlying the mid SVC. No pneumothorax. 2. Minimally improved aeration of the lung bases, left greater than right, otherwise, similar findings compatible with provided history of COVID 19 pneumonia. Electronically Signed   By: Sandi Mariscal M.D.   On: 06/24/2020 10:35   DG Fluoro Guide CV Line-No Report  Result Date: 06/24/2020 Fluoroscopy was utilized by the requesting physician.  No radiographic interpretation.    Labs: BMET Recent Labs  Lab 06/22/20 0404 06/22/20 1630 06/23/20 0408 06/23/20 1732 06/24/20 0309 06/24/20 1552 06/24/20 1956 06/25/20 0257  NA 137 137 138 139 136 134*  --  134*  K 3.5 3.4* 3.8 3.3* 3.6 4.7  --  4.6  CL 101 101 102 107 101 99  --  99  CO2 '25 26 26 23 23 '$ 19*  --  17*  GLUCOSE 155* 157* 148* 114* 114* 163*  --  111*  BUN 32* 30* 28* 25* 42* 57*  --  78*  CREATININE 2.52* 1.94* 1.73* 1.39* 2.80* 4.33*  --  5.43*  CALCIUM 7.6* 7.9* 7.9* 7.2* 7.7* 6.8*  --  6.5*  PHOS 2.6 1.5* 1.4* 1.1* <1.0* 5.5* 4.7* 5.3*   CBC Recent Labs  Lab 06/19/20 0042 06/19/20 0322  06/20/20 0457 06/21/20 0446 06/22/20 0404 06/23/20 1732 06/25/20 0843 06/25/20 1015  WBC 7.5  --  10.7* 13.3* 16.3* 29.9* 22.9*  --   NEUTROABS 6.8  --  8.8* 12.3*  --   --   --   --   HGB 9.2*   < > 8.1* 8.1* 7.7* 8.7* 7.0* 7.1*  HCT 28.4*   < > 23.6* 23.1* 23.9* 27.3* 22.8* 22.5*  MCV 93.7  --  88.4 89.5 92.6 95.5 100.0  --   PLT 128*  --  103* 91* 90* 110* 133*  --    < > = values in this interval not displayed.    Medications:    . sodium chloride   Intravenous Once  . albuterol  1-2 puff Inhalation Q6H  . amLODipine  10 mg Oral Daily  .  aspirin EC  81 mg Oral Daily  . atorvastatin  40 mg Oral q1800  . Chlorhexidine Gluconate Cloth  6 each Topical Q0600  . darbepoetin (ARANESP) injection - DIALYSIS  60 mcg Intravenous Q Wed-HD  . feeding supplement  1 Container Oral TID BM  . feeding supplement (NEPRO CARB STEADY)  237 mL Oral BID BM  . heparin injection (subcutaneous)  5,000 Units Subcutaneous Q8H  . influenza vaccine adjuvanted  0.5 mL Intramuscular Tomorrow-1000  . ipratropium  2 puff Inhalation Q6H  . metoprolol tartrate  25 mg Oral BID  . multivitamin  1 tablet Oral QHS  . pantoprazole  40 mg Oral Q1200  . pneumococcal 23 valent vaccine  0.5 mL Intramuscular Tomorrow-1000  . polyethylene glycol  17 g Oral Daily  . sodium chloride flush  3 mL Intravenous Q12H    Rexene Agent, MD  06/25/2020, 1:38 PM

## 2020-06-26 DIAGNOSIS — U071 COVID-19: Secondary | ICD-10-CM | POA: Diagnosis not present

## 2020-06-26 DIAGNOSIS — J069 Acute upper respiratory infection, unspecified: Secondary | ICD-10-CM | POA: Diagnosis not present

## 2020-06-26 LAB — TYPE AND SCREEN
ABO/RH(D): O POS
Antibody Screen: NEGATIVE
Unit division: 0

## 2020-06-26 LAB — CBC
HCT: 25.8 % — ABNORMAL LOW (ref 39.0–52.0)
Hemoglobin: 8.1 g/dL — ABNORMAL LOW (ref 13.0–17.0)
MCH: 29.3 pg (ref 26.0–34.0)
MCHC: 31.4 g/dL (ref 30.0–36.0)
MCV: 93.5 fL (ref 80.0–100.0)
Platelets: 156 10*3/uL (ref 150–400)
RBC: 2.76 MIL/uL — ABNORMAL LOW (ref 4.22–5.81)
RDW: 18 % — ABNORMAL HIGH (ref 11.5–15.5)
WBC: 18.5 10*3/uL — ABNORMAL HIGH (ref 4.0–10.5)
nRBC: 0 % (ref 0.0–0.2)

## 2020-06-26 LAB — RENAL FUNCTION PANEL
Albumin: 1.5 g/dL — ABNORMAL LOW (ref 3.5–5.0)
Anion gap: 21 — ABNORMAL HIGH (ref 5–15)
BUN: 109 mg/dL — ABNORMAL HIGH (ref 8–23)
CO2: 15 mmol/L — ABNORMAL LOW (ref 22–32)
Calcium: 6.2 mg/dL — CL (ref 8.9–10.3)
Chloride: 99 mmol/L (ref 98–111)
Creatinine, Ser: 8.21 mg/dL — ABNORMAL HIGH (ref 0.61–1.24)
GFR, Estimated: 7 mL/min — ABNORMAL LOW (ref >60)
Glucose, Bld: 93 mg/dL (ref 70–99)
Phosphorus: 8.3 mg/dL — ABNORMAL HIGH (ref 2.5–4.6)
Potassium: 5 mmol/L (ref 3.5–5.1)
Sodium: 135 mmol/L (ref 135–145)

## 2020-06-26 LAB — MAGNESIUM: Magnesium: 2.8 mg/dL — ABNORMAL HIGH (ref 1.7–2.4)

## 2020-06-26 LAB — BPAM RBC
Blood Product Expiration Date: 202201252359
ISSUE DATE / TIME: 202201181322
Unit Type and Rh: 5100

## 2020-06-26 LAB — GLUCOSE, CAPILLARY
Glucose-Capillary: 82 mg/dL (ref 70–99)
Glucose-Capillary: 79 mg/dL (ref 70–99)
Glucose-Capillary: 70 mg/dL (ref 70–99)

## 2020-06-26 MED ORDER — NEPRO/CARBSTEADY PO LIQD
237.0000 mL | Freq: Two times a day (BID) | ORAL | Status: DC
  Administered 2020-06-28 – 2020-07-02 (×7): 237 mL via ORAL

## 2020-06-26 MED ORDER — HEPARIN SODIUM (PORCINE) 1000 UNIT/ML IJ SOLN
INTRAMUSCULAR | Status: AC
  Filled 2020-06-26: qty 8

## 2020-06-26 MED ORDER — DARBEPOETIN ALFA 60 MCG/0.3ML IJ SOSY
PREFILLED_SYRINGE | INTRAMUSCULAR | Status: AC
  Filled 2020-06-26: qty 0.3

## 2020-06-26 MED ORDER — CALCIUM ACETATE (PHOS BINDER) 667 MG PO CAPS
667.0000 mg | ORAL_CAPSULE | Freq: Three times a day (TID) | ORAL | Status: DC
Start: 2020-06-26 — End: 2020-06-28
  Administered 2020-06-27 – 2020-06-28 (×4): 667 mg via ORAL
  Filled 2020-06-26 (×4): qty 1

## 2020-06-26 NOTE — Progress Notes (Signed)
Nutrition Follow-up  DOCUMENTATION CODES:   Not applicable  INTERVENTION:  Provide Nepro Shake po BID, each supplement provides 425 kcal and 19 grams protein.  Encourage adequate PO intake.   NUTRITION DIAGNOSIS:   Increased nutrient needs related to acute illness as evidenced by estimated needs; ongoing  GOAL:   Patient will meet greater than or equal to 90% of their needs; progressing  MONITOR:   PO intake,Supplement acceptance,Skin,Weight trends,Labs,I & O's  REASON FOR ASSESSMENT:   Malnutrition Screening Tool    ASSESSMENT:   65 yo male admitted with acute respiratory failure secondary to COVID pneumonia, CKD V with progression to ESRD with initiation of RRT. PMH includes PVD, CHF, CKD, HTN, COPD  1/09 Admitted 1/12 CRRT initiated  1/14 s/p abdominal aortogram w/ lower extremity 1/16 CRRT stopped, transition to HD schedule, pt now ESRD 1/17 Removal of temporary right IJ tunneled dialysis catheter, Ultrasound-guided placement of right IJ tunneled dialysis catheter  Plans for HD today. Meal completion has been 75-100%. Pt currently has Boost Breeze ordered and has been consuming them. RD to modify nutritional supplement orders and order Nepro shake to aid in increased caloric and protein needs. Labs and medications reviewed. Pt with ongoing painless rectal bleeding.  Diet Order:   Diet Order            Diet renal/carb modified with fluid restriction Fluid restriction: 1200 mL Fluid; Room service appropriate? Yes; Fluid consistency: Thin  Diet effective now                 EDUCATION NEEDS:   Not appropriate for education at this time  Skin:  Skin Assessment: Reviewed RN Assessment  Last BM:  1/18  Height:   Ht Readings from Last 1 Encounters:  06/16/20 '5\' 8"'$  (1.727 m)    Weight:   Wt Readings from Last 1 Encounters:  06/24/20 60.3 kg   BMI:  Body mass index is 20.21 kg/m.  Estimated Nutritional Needs:   Kcal:  1900-2200 kcals  Protein:   95-120 g  Fluid:  1000 mL plus UOP  Corrin Parker, MS, RD, LDN RD pager number/after hours weekend pager number on Amion.

## 2020-06-26 NOTE — Progress Notes (Signed)
Patient had active rectal bleed from his hemorrhoids after having bowel movement, Bleeding was stopped. Dr. Manus Rudd Internal  Family medicine was notified. Dr Carmelina Noun called back to assess Patient. Dr. Michela Pitcher to wait till CBC is done because patient had blood transfusion in the previous shift. and pt will have dialysis in the morning, if patient will require another blood,  it may be done at the dialysis.     Critical Lab Calcium 6.2.  also notified to Dr. Manus Rudd no new order received.Marland Kitchen

## 2020-06-26 NOTE — Progress Notes (Signed)
Referral completed and submitted to [facility] for outpatient HD treatment for ESRD now that patient is out of ICU and has transitioned from CRRT to [facility].  Navigator has requested any clinic who can accommodate a new start and will continue to monitor for disposition plans and discharge date.  Navigator will follow up with team and patient/family when seat can be secured. Patient's COVID positive status may delay seat placement.   Alphonzo Cruise, [place] [facility] Navigator [phone]

## 2020-06-26 NOTE — Progress Notes (Addendum)
Family Medicine Teaching Service Daily Progress Note Intern Pager: [phone]  Patient name: Albert Harris Medical record number: [id number] Date of birth: [birth year] Age: 65 y.o. Gender: male  Primary Care Provider: Kerin Perna, NP Consultants: VVS, Nephro, GI Code Status: Full  Pt Overview and Major Events to Date:  1/9 admitted covid + 1/11 BCx pos for HFlu; transfused for Hgb 7.8 + BRBPR 1/12 Transferred to ICU 1/14 abdominal aortogram with stent 1/17 Tunneled IJ cath placed 1/18 transferred out of the ICU to [facility] [place] and Plan:  CKD5 now ESRD: Scheduled for inpatient dialysis this morning. RFP with electrolyte derangements consistent with ESRD. Patient has been anuric for several days now. Phos today is 8.3. Ca++ low at 6.2 (corrects to 8.5). His phosphate binder had been held for several days due to hypophosphatemia in the ICU.  - Dialysis today - Will add PhosLo '667mg'$  TID with meals  - Nephro following, appreciate recommendations - Renal diet - Daily RFP, mag  GI Bleed: S/p transfusion of 1 u PRBC yesterday. Hgb 7.0>7.6>8.1. Per nephro, given blood shortage, his new transfusion threshold should be <7. He should receive Aranesp with HD today. Bleeding believed to be hemorrhoidal, but patient has not had a colonoscopy since 2005. Dr. Benson Norway, GI, is following. Saw pt yesterday and elected to hold off on colonoscopy due to tachypnea. Patient's tachypnea has resolved and he continues to have active rectal bleeding.  - Will consult GI re: colonoscopy, grateful for their assistance - Continue holding Plavix - Daily CBC, transfuse if Hgb <7.   AHRF 2/2 COVID-19- Resolved: Breathing comfortably on room air. S/p remdesivir & steroids.  HFlu Bacteremia- Resolved:  Transitioned from ampicillin to ceftriaxone. Finishing course today.  - Day 8/8 IV abx.  Arterial Occlusive Disease: No pain today. LE exam unchanged from previous despite transfusion: Some ischemic  darkening of R toes. R toes cool to touch. DP pulse weakly perceptible on R side. Sensation intact. Consider that these findings could be due to arterial occlusive disease vs. "Covid toes."   - VVS following, appreciate recs.  - Hold Plavix due to GI bleed - Atorvastatin '40mg'$  daily  HFpEF  HTN:  Intermittently hypertensive to the 0000000 systolic.  - Continue Metoprolol tartrate '25mg'$  BID  FEN/GI: Renal diet PPx: Heparin   Remains inpatient appropriate because:Inpatient level of care appropriate due to severity of illness   Dispo: The patient is from: Home              Anticipated d/c is to: SNF              Anticipated d/c date is: 2 days              Patient currently is not medically stable to d/c.   Subjective:  Albert Harris is complaining of ongoing rectal bleeding since his stool this morning. He denies any cramping or pain associated with this. He feels well otherwise and would really like a visit from his wife.  No CP/SOB. Breathing comfortably on RA.   Objective: Temp:  [97.7 F (36.5 C)-99.1 F (37.3 C)] 98 F (36.7 C) (01/19 0407) Pulse Rate:  [83-101] 89 (01/19 0407) Resp:  [15-30] 15 (01/19 0407) BP: (118-148)/(53-82) 118/53 (01/19 0407) SpO2:  [95 %-100 %] 96 % (01/19 0407) Physical Exam: General: Tired-appearing, resting in bed Cardiovascular: RRR, no m/r/g Respiratory: Lungs CTAB, normal WOB on RA Abdomen: Bowel sounds nl, non-tender, non-distended Extremities: Trace LE edema bilaterally, sensation intact through bilateral feet, darkening  of skin of R toes consistent w/ ischemic changes, R toes cold to touch compared to L. PT pulses 1+ bilaterally. DP pulse 1+ on left, barely perceptible on R.   Laboratory: Recent Labs  Lab 06/22/20 0404 06/23/20 1732 06/25/20 0843 06/25/20 1015 06/25/20 1832  WBC 16.3* 29.9* 22.9*  --   --   HGB 7.7* 8.7* 7.0* 7.1* 7.6*  HCT 23.9* 27.3* 22.8* 22.5* 23.0*  PLT 90* 110* 133*  --   --    Recent Labs  Lab  06/20/20 0214 06/20/20 0456 06/21/20 0446 06/21/20 1544 06/22/20 0404 06/22/20 1630 06/24/20 1552 06/25/20 0257 06/26/20 0343  NA 140   < > 138   < > 137   < > 134* 134* 135  K 4.3   < > 3.6   < > 3.5   < > 4.7 4.6 5.0  CL 102   < > 102   < > 101   < > 99 99 99  CO2 19*   < > 22   < > 25   < > 19* 17* 15*  BUN 83*   < > 36*   < > 32*   < > 57* 78* 109*  CREATININE 8.54*   < > 3.38*   < > 2.52*   < > 4.33* 5.43* 8.21*  CALCIUM 7.2*   < > 7.7*   < > 7.6*   < > 6.8* 6.5* 6.2*  PROT 5.0*  --  4.9*  --  4.7*  --   --   --   --   BILITOT 0.8  --  0.9  --  0.7  --   --   --   --   ALKPHOS 52  --  54  --  63  --   --   --   --   ALT 25  --  26  --  24  --   --   --   --   AST 41  --  66*  --  49*  --   --   --   --   GLUCOSE 103*   < > 103*   < > 155*   < > 163* 111* 93   < > = values in this interval not displayed.    Imaging/Diagnostic Tests: No new imaging/labs  Pearla Dubonnet, Medical Student 06/26/2020, 6:23 AM

## 2020-06-26 NOTE — Progress Notes (Signed)
[facility] Progress Note     Assessment/ Plan:   # CKD stage V--now ESRD  - setting of covid superimposed on advanced CKD. CRRT started 1/12-1/16 urgently given worsening respiratory status and timing. Remains hemodynamically stable - For HD On MWF schedule COVID shift while inpatient - f/u CLIP - Maturing AVF, using TDC VVS placed  # Acute hypoxic respiratory failure - Secondary to covid PNA  -per primary service: remdesivir+steroids; improved  # Covid 19 PNA  - per primary service  # HTN  -BP acceptable, expecting drop/improvement with ultrafiltration  # Anemia of CKD - transfuse prn for hgb <7, cont ESA  # Secondary hyperparathyroidism  - PTH was 201 on 06/10/20.P < 3 no binder, encourage POl; trend with HD  # Arterial occlusive disease -vvs on board, arteriogram 1/14 w/ intervention: anio and stent of right ext iliac artery, right common iliac artery, left common iliac art   Subjective:     No c/o  For HD today   Objective:   BP (!) 114/39 (BP Location: Right Arm)   Pulse 76   Temp 98 F (36.7 C) (Oral)   Resp 20   Ht '5\' 8"'$  (1.727 m)   Wt 60.3 kg   SpO2 99%   BMI 20.21 kg/m   Intake/Output Summary (Last 24 hours) at 06/26/2020 1417 Last data filed at 06/26/2020 0915 Gross per 24 hour  Intake 1538.5 ml  Output --  Net 1538.5 ml   Weight change:   Physical Exam: GEN: NAD, pleasant, NCAT HEENT: No conjunctival pallor, EOMI NECK: Supple, no thyromegaly LUNGS: CTA B/L no rales CV: RRR, No M/R/G ABD: SNDNT +BS  EXT: No lower extremity edema ACCESS: RIJ TDC and left BCF   Imaging: No results found.  Labs: BMET Recent Labs  Lab 06/22/20 1630 06/23/20 0408 06/23/20 1732 06/24/20 0309 06/24/20 1552 06/24/20 1956 06/25/20 0257 06/26/20 0343  NA 137 138 139 136 134*  --  134* 135  K 3.4* 3.8 3.3* 3.6 4.7  --  4.6 5.0  CL 101 102 107 101 99  --  99 99  CO2 '26 26 23 23 '$ 19*  --  17* 15*  GLUCOSE 157* 148* 114* 114*  163*  --  111* 93  BUN 30* 28* 25* 42* 57*  --  78* 109*  CREATININE 1.94* 1.73* 1.39* 2.80* 4.33*  --  5.43* 8.21*  CALCIUM 7.9* 7.9* 7.2* 7.7* 6.8*  --  6.5* 6.2*  PHOS 1.5* 1.4* 1.1* <1.0* 5.5* 4.7* 5.3* 8.3*   CBC Recent Labs  Lab 06/20/20 0457 06/21/20 0446 06/22/20 0404 06/23/20 1732 06/25/20 0843 06/25/20 1015 06/25/20 1832 06/26/20 0842  WBC 10.7* 13.3* 16.3* 29.9* 22.9*  --   --  18.5*  NEUTROABS 8.8* 12.3*  --   --   --   --   --   --   HGB 8.1* 8.1* 7.7* 8.7* 7.0* 7.1* 7.6* 8.1*  HCT 23.6* 23.1* 23.9* 27.3* 22.8* 22.5* 23.0* 25.8*  MCV 88.4 89.5 92.6 95.5 100.0  --   --  93.5  PLT 103* 91* 90* 110* 133*  --   --  156    Medications:    . sodium chloride   Intravenous Once  . amLODipine  10 mg Oral Daily  . aspirin EC  81 mg Oral Daily  . atorvastatin  40 mg Oral q1800  . calcium acetate  667 mg Oral TID WC  . Chlorhexidine Gluconate Cloth  6 each Topical Q0600  .  darbepoetin (ARANESP) injection - DIALYSIS  60 mcg Intravenous Q Wed-HD  . feeding supplement  1 Container Oral TID BM  . heparin injection (subcutaneous)  5,000 Units Subcutaneous Q8H  . influenza vaccine adjuvanted  0.5 mL Intramuscular Tomorrow-1000  . metoprolol tartrate  25 mg Oral BID  . multivitamin  1 tablet Oral QHS  . pantoprazole  40 mg Oral Q1200  . pneumococcal 23 valent vaccine  0.5 mL Intramuscular Tomorrow-1000  . polyethylene glycol  17 g Oral Daily  . sodium chloride flush  3 mL Intravenous Q12H    Rexene Agent, MD  06/26/2020, 2:17 PM

## 2020-06-26 NOTE — Plan of Care (Signed)
  Problem: Clinical Measurements: Goal: Cardiovascular complication will be avoided Outcome: Progressing   Problem: Activity: Goal: Risk for activity intolerance will decrease Outcome: Progressing   Problem: Nutrition: Goal: Adequate nutrition will be maintained Outcome: Progressing   Problem: Elimination: Goal: Will not experience complications related to bowel motility Outcome: Progressing   Problem: Clinical Measurements: Goal: Ability to maintain clinical measurements within normal limits will improve Outcome: Progressing   Problem: Activity: Goal: Risk for activity intolerance will decrease Outcome: Progressing   Problem: Nutrition: Goal: Adequate nutrition will be maintained Outcome: Progressing   Problem: Coping: Goal: Level of anxiety will decrease Outcome: Progressing   Problem: Elimination: Goal: Will not experience complications related to bowel motility Outcome: Progressing

## 2020-06-27 ENCOUNTER — Encounter (HOSPITAL_COMMUNITY)
Admission: EM | Disposition: A | Discharge status: Home Health | Payer: Self-pay | Source: Home / Self Care | Attending: Family Medicine

## 2020-06-27 ENCOUNTER — Inpatient Hospital Stay (HOSPITAL_COMMUNITY): Payer: Medicare Other | Admitting: Certified Registered Nurse Anesthetist

## 2020-06-27 ENCOUNTER — Encounter (HOSPITAL_COMMUNITY): Payer: Self-pay | Admitting: Student in an Organized Health Care Education/Training Program

## 2020-06-27 DIAGNOSIS — U071 COVID-19: Secondary | ICD-10-CM | POA: Diagnosis not present

## 2020-06-27 DIAGNOSIS — J1282 Pneumonia due to coronavirus disease 2019: Secondary | ICD-10-CM

## 2020-06-27 DIAGNOSIS — N179 Acute kidney failure, unspecified: Secondary | ICD-10-CM | POA: Diagnosis not present

## 2020-06-27 DIAGNOSIS — N185 Chronic kidney disease, stage 5: Secondary | ICD-10-CM | POA: Diagnosis not present

## 2020-06-27 HISTORY — PX: COLONOSCOPY WITH PROPOFOL: SHX5780

## 2020-06-27 HISTORY — PX: HEMOSTASIS CLIP PLACEMENT: SHX6857

## 2020-06-27 LAB — RENAL FUNCTION PANEL
Albumin: 1.6 g/dL — ABNORMAL LOW (ref 3.5–5.0)
Anion gap: 15 (ref 5–15)
BUN: 43 mg/dL — ABNORMAL HIGH (ref 8–23)
CO2: 22 mmol/L (ref 22–32)
Calcium: 7.2 mg/dL — ABNORMAL LOW (ref 8.9–10.3)
Chloride: 104 mmol/L (ref 98–111)
Creatinine, Ser: 4.88 mg/dL — ABNORMAL HIGH (ref 0.61–1.24)
GFR, Estimated: 12 mL/min — ABNORMAL LOW (ref >60)
Glucose, Bld: 85 mg/dL (ref 70–99)
Phosphorus: 5.2 mg/dL — ABNORMAL HIGH (ref 2.5–4.6)
Potassium: 4.2 mmol/L (ref 3.5–5.1)
Sodium: 141 mmol/L (ref 135–145)

## 2020-06-27 LAB — CBC
HCT: 23.9 % — ABNORMAL LOW (ref 39.0–52.0)
Hemoglobin: 7.7 g/dL — ABNORMAL LOW (ref 13.0–17.0)
MCH: 30 pg (ref 26.0–34.0)
MCHC: 32.2 g/dL (ref 30.0–36.0)
MCV: 93 fL (ref 80.0–100.0)
Platelets: 168 10*3/uL (ref 150–400)
RBC: 2.57 MIL/uL — ABNORMAL LOW (ref 4.22–5.81)
RDW: 18 % — ABNORMAL HIGH (ref 11.5–15.5)
WBC: 10.9 10*3/uL — ABNORMAL HIGH (ref 4.0–10.5)
nRBC: 0 % (ref 0.0–0.2)

## 2020-06-27 LAB — GLUCOSE, CAPILLARY
Glucose-Capillary: 75 mg/dL (ref 70–99)
Glucose-Capillary: 129 mg/dL — ABNORMAL HIGH (ref 70–99)

## 2020-06-27 LAB — MAGNESIUM: Magnesium: 2.5 mg/dL — ABNORMAL HIGH (ref 1.7–2.4)

## 2020-06-27 SURGERY — COLONOSCOPY WITH PROPOFOL
Anesthesia: Monitor Anesthesia Care

## 2020-06-27 MED ORDER — PHENYLEPHRINE 40 MCG/ML (10ML) SYRINGE FOR IV PUSH (FOR BLOOD PRESSURE SUPPORT)
PREFILLED_SYRINGE | INTRAVENOUS | Status: DC | PRN
  Administered 2020-06-27 (×2): 120 ug via INTRAVENOUS

## 2020-06-27 MED ORDER — SODIUM CHLORIDE 0.9% FLUSH: 10.0000 mL | INTRAVENOUS | Status: DC | PRN

## 2020-06-27 MED ORDER — EPHEDRINE SULFATE-NACL 50-0.9 MG/10ML-% IV SOSY
PREFILLED_SYRINGE | INTRAVENOUS | Status: DC | PRN
  Administered 2020-06-27 (×2): 10 mg via INTRAVENOUS
  Administered 2020-06-27 (×2): 15 mg via INTRAVENOUS

## 2020-06-27 MED ORDER — SODIUM CHLORIDE 0.9 % IV SOLN: INTRAVENOUS | Status: DC | PRN

## 2020-06-27 MED ORDER — LIDOCAINE 2% (20 MG/ML) 5 ML SYRINGE
INTRAMUSCULAR | Status: DC | PRN
  Administered 2020-06-27 (×2): 50 mg via INTRAVENOUS

## 2020-06-27 MED ORDER — POLYETHYLENE GLYCOL 3350 17 GM/SCOOP PO POWD
1.0000 | Freq: Once | ORAL | Status: AC
  Administered 2020-06-27: 255 g via ORAL
  Filled 2020-06-27: qty 255

## 2020-06-27 MED ORDER — PROPOFOL 10 MG/ML IV BOLUS
INTRAVENOUS | Status: DC | PRN
  Administered 2020-06-27 (×3): 20 mg via INTRAVENOUS

## 2020-06-27 MED ORDER — PANTOPRAZOLE SODIUM 40 MG PO TBEC: 40.0000 mg | DELAYED_RELEASE_TABLET | Freq: Every day | ORAL | Status: DC | PRN

## 2020-06-27 MED ORDER — PROPOFOL 500 MG/50ML IV EMUL
INTRAVENOUS | Status: DC | PRN
  Administered 2020-06-27: 80 ug/kg/min via INTRAVENOUS

## 2020-06-27 SURGICAL SUPPLY — 22 items

## 2020-06-27 NOTE — Progress Notes (Signed)
This is to clarify that I placed in all the orders around 3 PM yesterday and I spoke with his nurse yesterday to confirm that the patient needed to start his prep.  Additionally, I told the nurse to ensure that at the time of shift change the oncoming nurse was to be informed about his prep and pending colonoscopy.  This is the third time, with three separate patients, that 5W nursing failed to release orders and/or failed to carry out orders, after being verbally informed about the plan for endoscopic procedures in person or via telephone.  I discussed this repeated failure with the charge nurse and the nursing manager today.

## 2020-06-27 NOTE — Progress Notes (Signed)
   VASCULAR SURGERY ASSESSMENT & PLAN:   STATUS POST BILATERAL ILIAC ANGIOPLASTY AND STENTING: He has good femoral pulses.  He has known infrainguinal arterial occlusive disease.  Both feet are warm and well-perfused.  His only options for further revascularization would be femoropopliteal bypass grafting.  Given that his feet are warm and well-perfused and the toes are slowly healing we will hold off on this.  Vascular surgery will continue to follow peripherally.   SUBJECTIVE:   No specific complaints.  He look stronger.  PHYSICAL EXAM:   Vitals:   06/27/20 0005 06/27/20 0412 06/27/20 0643 06/27/20 0807  BP: (!) 169/78 109/69  140/76  Pulse: (!) 104 79  94  Resp: 20 20  (!) 24  Temp: 99.9 F (37.7 C) 98.2 F (36.8 C)  98.3 F (36.8 C)  TempSrc: Oral Oral  Oral  SpO2: 95% 93%  94%  Weight:   58.3 kg   Height:       Palpable femoral pulses. Both feet are warm with some mild ischemic changes to the toes of the right foot.  LABS:   Lab Results  Component Value Date   WBC 10.9 (H) 06/27/2020   HGB 7.7 (L) 06/27/2020   HCT 23.9 (L) 06/27/2020   MCV 93.0 06/27/2020   PLT 168 06/27/2020   Lab Results  Component Value Date   CREATININE 4.88 (H) 06/27/2020   Lab Results  Component Value Date   INR 1.2 06/16/2020   CBG (last 3)  Recent Labs    06/26/20 1245 06/26/20 2050 06/27/20 0824  GLUCAP 79 70 75    PROBLEM LIST:    Active Problems:   Acute respiratory disease due to COVID-19 virus   Acute renal failure superimposed on stage 5 chronic kidney disease, not on chronic dialysis ([place])   CURRENT MEDS:   . sodium chloride   Intravenous Once  . amLODipine  10 mg Oral Daily  . aspirin EC  81 mg Oral Daily  . atorvastatin  40 mg Oral q1800  . calcium acetate  667 mg Oral TID WC  . Chlorhexidine Gluconate Cloth  6 each Topical Q0600  . darbepoetin (ARANESP) injection - DIALYSIS  60 mcg Intravenous Q Wed-HD  . feeding supplement (NEPRO CARB STEADY)  237 mL Oral  BID BM  . heparin injection (subcutaneous)  5,000 Units Subcutaneous Q8H  . influenza vaccine adjuvanted  0.5 mL Intramuscular Tomorrow-1000  . metoprolol tartrate  25 mg Oral BID  . multivitamin  1 tablet Oral QHS  . pantoprazole  40 mg Oral Q1200  . pneumococcal 23 valent vaccine  0.5 mL Intramuscular Tomorrow-1000  . polyethylene glycol  17 g Oral Daily  . polyethylene glycol powder  1 Container Oral Once  . sodium chloride flush  3 mL Intravenous Q12H    [name] Office: [phone] 06/27/2020

## 2020-06-27 NOTE — Progress Notes (Addendum)
Gilles denies any concerns. He wants to know when he will be discharged home.  Physical exam is benign.  A/P: Acute on Chronic Normocytic anemia: Likely due to chronic disease in acute lower GI bleed settings. Hemoglobin of 7.7 today. Although there is a lack of evidence-based guidelines on appropriate transfusion thresholds for PVD, older patients with PVD tend to tolerate anemia poorly. I will keep his transfusion threshold at eight unless otherwise stated by nephrology. He will benefit from 1 unit PRBC Colonoscopy schedule per GI.  PVD management per vascular surg  ESRD: Dialysis per nephro.  Hypocalcemia/Hyperphosphatemia: Improved on Phoslo. Monitor closely.

## 2020-06-27 NOTE — Progress Notes (Signed)
[facility] Progress Note     Assessment/ Plan:   # CKD stage V--now ESRD  - setting of covid superimposed on advanced CKD. CRRT started 1/12-1/16 urgently given worsening respiratory status and timing. Remains hemodynamically stable - First HD 06/26/20, tol well, cont on MWF COVID shift for now - CLIP in process - Maturing AVF, using TDC VVS placed  # Acute hypoxic respiratory failure - Secondary to covid PNA  -per primary service: remdesivir+steroids; improved  # Covid 19 PNA  - per primary service  # HTN  -BP acceptable, expecting drop/improvement with ultrafiltration  # Anemia of CKD - transfuse prn for hgb <8, cont ESA aranesp 60 qWed  # Secondary hyperparathyroidism  - PTH was 201 on 06/10/20.P < 3 no binder, encourage POl; trend with HD  # Arterial occlusive disease -vvs on board, arteriogram 1/14 w/ intervention: anio and stent of right ext iliac artery, right common iliac artery, left common iliac art   Subjective:     HD yesterday: 1.5L UF   Objective:   BP (!) 146/69 (BP Location: Left Arm)   Pulse 76   Temp 97.8 F (36.6 C) (Oral)   Resp 20   Ht '5\' 8"'$  (1.727 m)   Wt 58.3 kg   SpO2 92%   BMI 19.54 kg/m   Intake/Output Summary (Last 24 hours) at 06/27/2020 1308 Last data filed at 06/27/2020 1200 Gross per 24 hour  Intake 159 ml  Output 1502 ml  Net -1343 ml   Weight change:   Physical Exam: GEN: NAD, pleasant, NCAT HEENT: No conjunctival pallor, EOMI NECK: Supple, no thyromegaly LUNGS: CTA B/L no rales CV: RRR, No M/R/G ABD: SNDNT +BS  EXT: No lower extremity edema ACCESS: RIJ TDC and left BCF   Imaging: No results found.  Labs: BMET Recent Labs  Lab 06/23/20 0408 06/23/20 1732 06/24/20 0309 06/24/20 1552 06/24/20 1956 06/25/20 0257 06/26/20 0343 06/27/20 0225  NA 138 139 136 134*  --  134* 135 141  K 3.8 3.3* 3.6 4.7  --  4.6 5.0 4.2  CL 102 107 101 99  --  99 99 104  CO2 '26 23 23 '$ 19*  --  17* 15*  22  GLUCOSE 148* 114* 114* 163*  --  111* 93 85  BUN 28* 25* 42* 57*  --  78* 109* 43*  CREATININE 1.73* 1.39* 2.80* 4.33*  --  5.43* 8.21* 4.88*  CALCIUM 7.9* 7.2* 7.7* 6.8*  --  6.5* 6.2* 7.2*  PHOS 1.4* 1.1* <1.0* 5.5* 4.7* 5.3* 8.3* 5.2*   CBC Recent Labs  Lab 06/21/20 0446 06/22/20 0404 06/23/20 1732 06/25/20 0843 06/25/20 1015 06/25/20 1832 06/26/20 0842 06/27/20 0225  WBC 13.3*   < > 29.9* 22.9*  --   --  18.5* 10.9*  NEUTROABS 12.3*  --   --   --   --   --   --   --   HGB 8.1*   < > 8.7* 7.0* 7.1* 7.6* 8.1* 7.7*  HCT 23.1*   < > 27.3* 22.8* 22.5* 23.0* 25.8* 23.9*  MCV 89.5   < > 95.5 100.0  --   --  93.5 93.0  PLT 91*   < > 110* 133*  --   --  156 168   < > = values in this interval not displayed.    Medications:    . amLODipine  10 mg Oral Daily  . aspirin EC  81 mg Oral Daily  . atorvastatin  40 mg Oral q1800  . calcium acetate  667 mg Oral TID WC  . Chlorhexidine Gluconate Cloth  6 each Topical Q0600  . darbepoetin (ARANESP) injection - DIALYSIS  60 mcg Intravenous Q Wed-HD  . feeding supplement (NEPRO CARB STEADY)  237 mL Oral BID BM  . heparin injection (subcutaneous)  5,000 Units Subcutaneous Q8H  . influenza vaccine adjuvanted  0.5 mL Intramuscular Tomorrow-1000  . metoprolol tartrate  25 mg Oral BID  . multivitamin  1 tablet Oral QHS  . pneumococcal 23 valent vaccine  0.5 mL Intramuscular Tomorrow-1000    Rexene Agent, MD  06/27/2020, 1:08 PM

## 2020-06-27 NOTE — Progress Notes (Signed)
PT Cancellation Note  Patient Details Name: Albert Harris MRN: [id number] DOB: 1955-07-29   Cancelled Treatment:    Reason Eval/Treat Not Completed: Patient at procedure or test/unavailable. Will follow-up for PT Evaluation as schedule permits.  Mabeline Caras, PT, DPT Acute Rehabilitation Services  Pager [phone] Office [place] 06/27/2020, 2:31 PM

## 2020-06-27 NOTE — Op Note (Signed)
[facility] Patient Name: Albert Harris Procedure Date : 06/27/2020 MRN: [id number] Attending MD: Carol Ada , MD Date of Birth: 1955/09/12 CSN: [id number] Age: 65 Admit Type: Inpatient Procedure:                Colonoscopy Indications:              Hematochezia Providers:                Carol Ada, MD, Baird Cancer, RN, Elspeth Cho                            Tech., Technician Referring MD:              Medicines:                Propofol per Anesthesia Complications:            No immediate complications. Estimated Blood Loss:     Estimated blood loss: none. Procedure:                Pre-Anesthesia Assessment:                           - Prior to the procedure, a History and Physical                            was performed, and patient medications and                            allergies were reviewed. [name] tolerance of                            previous anesthesia was also reviewed. The risks                            and benefits of the procedure and the sedation                            options and risks were discussed with the patient.                            All questions were answered, and informed consent                            was obtained. Prior Anticoagulants: The patient has                            taken no previous anticoagulant or antiplatelet                            agents. ASA Grade Assessment: III - A patient with                            severe systemic disease. After reviewing the risks                            and benefits,  the patient was deemed in                            satisfactory condition to undergo the procedure.                           - Sedation was administered by an anesthesia                            professional. Deep sedation was attained.                           After obtaining informed consent, the colonoscope                            was passed under direct vision. Throughout the                             procedure, the patient's blood pressure, pulse, and                            oxygen saturations were monitored continuously. The                            [id number]) Olympus colonoscope was                            introduced through the anus and advanced to the the                            cecum, identified by appendiceal orifice and                            ileocecal valve. The colonoscopy was performed                            without difficulty. The patient tolerated the                            procedure well. The quality of the bowel                            preparation was good. The ileocecal valve,                            appendiceal orifice, and rectum were photographed. Scope In: 2:49:34 PM Scope Out: 3:12:53 PM Scope Withdrawal Time: 0 hours 17 minutes 2 seconds  Total Procedure Duration: 0 hours 23 minutes 19 seconds  Findings:      A single (solitary) three mm ulcer was found in the rectum. Stigmata of       recent bleeding were present. For hemostasis, one hemostatic clip was       successfully placed (MR unsafe). There was no bleeding at the end of the       procedure.      Scattered small  and large-mouthed diverticula were found in the sigmoid       colon and descending colon. \      In the rectum there was evidence of some blood. The area was washed off       and there was evidence of a small ulceration and surrounding friability.       There was active oozing from this area. In addition, there was the       suspicion of a flat small visible vessel. This can be consistent with a       stercoral ulcer that is in the process of healing. A hemoclip was       deployed across the site arrest the oozing and to prevent any possible       future bleeding. Also, he was noted to have very large int/ext       hemorrhoids. Impression:               - A single (solitary) ulcer in the rectum. Clip (MR                            unsafe)  was placed.                           - Diverticulosis in the sigmoid colon and in the                            descending colon.                           - Large Int/Ext hemorrhoids.                           - No specimens collected. Recommendation:           - Return patient to hospital ward for ongoing care.                           - Resume regular diet.                           - Continue present medications.                           - - If bleeding recurs, the bleeding source will be                            his hemorrhoids as he is s/p hemocliping of the                            possible vessel. In this instance, surgery will                            need to be called as he has very large                            internal/external hemorrhoids. Procedure Code(s):        --- Professional ---  45382, Colonoscopy, flexible; with control of                            bleeding, any method Diagnosis Code(s):        --- Professional ---                           K62.6, Ulcer of anus and rectum                           K92.1, Melena (includes Hematochezia)                           K57.30, Diverticulosis of large intestine without                            perforation or abscess without bleeding CPT copyright 2019 [employer]. All rights reserved. The codes documented in this report are preliminary and upon coder review may  be revised to meet current compliance requirements. Carol Ada, MD Carol Ada, MD 06/27/2020 3:31:57 PM This report has been signed electronically. Number of Addenda: 0

## 2020-06-27 NOTE — Progress Notes (Signed)
Interim progress note  Unfortunately this is a late review of this patient's chart.  Patient was supposed to have colonoscopy today 1/20.  Gastroenterology did not place any orders for bowel prep.  Place the patient is n.p.o. except for bowel prep with MiraLAX.  Spoke with patient's nurse, notified of plan.  Milus Banister, [place], PGY-3 06/27/2020 7:05 AM

## 2020-06-27 NOTE — Anesthesia Preprocedure Evaluation (Addendum)
Anesthesia Evaluation    Reviewed: Allergy & Precautions, Patient's Chart, lab work & pertinent test results  History of Anesthesia Complications (+) PROLONGED EMERGENCE  Airway Mallampati: II  TM Distance: >3 FB Neck ROM: Full    Dental  (+) Poor Dentition, Missing, Chipped, Dental Advisory Given,  Extremely poor dentition throughout :   Pulmonary asthma , pneumonia (COVID +), unresolved, COPD, former smoker,  Quit smoking 2021, 50 pack year history  COVID pna- remdesivir and steroids, currently on RA  Coarse breath sounds, actively coughing    + decreased breath sounds      Cardiovascular hypertension, Pt. on medications + Peripheral Vascular Disease and +CHF (grade 2 diastolic dysfunction)  Normal cardiovascular exam+ Valvular Problems/Murmurs (mild MR) MR  Rhythm:Regular Rate:Normal  Echo 07/2019: 1. Left ventricular ejection fraction, by estimation, is 60 to 65%. The  left ventricle has normal function. The left ventrical has no regional  wall motion abnormalities. There is mildly increased left ventricular  hypertrophy. Left ventricular diastolic  parameters are consistent with Grade II diastolic dysfunction  (pseudonormalization). Elevated left ventricular end-diastolic pressure.  2. Right ventricular systolic function is normal. The right ventricular  size is normal. There is mildly elevated pulmonary artery systolic  pressure. The estimated right ventricular systolic pressure is 10.2 mmHg.  3. Left atrial size was mild to moderately dilated.  4. Mild mitral valve regurgitation.  5. The aortic valve is normal in structure and function. Aortic valve  regurgitation is not visualized. No aortic stenosis is present.  6. The inferior vena cava is dilated in size with <50% respiratory  variability, suggesting right atrial pressure of 15 mmHg.    Neuro/Psych TIACVA, No Residual Symptoms negative psych ROS    GI/Hepatic Neg liver ROS, GERD  Controlled,  Endo/Other  negative endocrine ROS  Renal/GU ESRF and DialysisRenal diseaseK 4.2 - setting of covid superimposed on advanced CKD. CRRT started 1/12-1/16 urgently given worsening respiratory status and timing. Remains hemodynamically stable - First HD 06/26/20, tol well, cont on MWF COVID shift for now  negative genitourinary   Musculoskeletal negative musculoskeletal ROS (+)   Abdominal Normal abdominal exam  (+)   Peds  Hematology  (+) Blood dyscrasia, anemia , H/H 7.7/23.9 at 2AM hematochezia   Anesthesia Other Findings   Reproductive/Obstetrics negative OB ROS                            Anesthesia Physical Anesthesia Plan  ASA: IV  Anesthesia Plan: MAC   Post-op Pain Management:    Induction:   PONV Risk Score and Plan: 2 and Propofol infusion and TIVA  Airway Management Planned: Natural Airway and Simple Face Mask  Additional Equipment: None  Intra-op Plan:   Post-operative Plan:   Informed Consent: I have reviewed the patients History and Physical, chart, labs and discussed the procedure including the risks, benefits and alternatives for the proposed anesthesia with the patient or authorized representative who has indicated his/her understanding and acceptance.       Plan Discussed with: CRNA  Anesthesia Plan Comments:         Anesthesia Quick Evaluation

## 2020-06-27 NOTE — Progress Notes (Addendum)
Family Medicine Teaching Service Daily Progress Note Intern Pager: [phone]  Patient name: Albert Harris Medical record number: [id number] Date of birth: November 10, 1955 Age: 65 y.o. Gender: male  Primary Care Provider: Kerin Perna, NP Consultants: GI; VVS; Nephro Code Status: Full  Pt Overview and Major Events to Date:  1/9 admittedcovid + 1/11 BCxpos for HFlu; transfused for Hgb 7.8 + BRBPR 1/12Transferred to ICU 1/14 abdominal aortogram with stent 1/17 Tunneled IJ cath placed 1/18 transferred out of the ICU toFPTS  Assessment and Plan:  CKD5 now ESRD: Dialyzed in hospital yesterday. RFP this am.  with electrolyte derangements consistent with ESRD. Patient has been anuric for several days now. Phos 8.3>5.2 s/p dialysis and addition of PhosLo yesterday. Ca 6.2>7.2. Mag 2.5.  - PhosLo '667mg'$  TID with meals  - Nephro following, appreciate recommendations - Renal diet - Daily RFP, mag - PT/OT Eval and Treat  GI Bleed: Patient scheduled to have colonoscopy with Dr. Benson Harris today. Did not start bowel prep until this morning, so expect this will have to wait until tomorrow. Has had no further episodes of bleeding since yesterday am. Hgb 7.7 this am. -Colonoscopy per GI, grateful for their assistance - Continue holding Plavix - Daily CBC, transfuse if Hgb <7.   Arterial Occlusive Disease: No pain today. Toes of R foot appear much improved on exam compared to previous. Warm to touch and without ischemic changes. Per vascular, will require bilateral LE bypass grafts, but these can be done on an outpt basis.  - VVS following, appreciate recs. - Hold Plavix due to GI bleed - Atorvastatin '40mg'$  daily  HFpEFHTN:  Intermittently hypertensive to the [id number] systolic. - Continue Metoprololtartrate'25mg'$  BID  AHRF 2/2 COVID-19- Resolved: Breathing comfortably on room air. S/p remdesivir & steroids. - Okay to remove COVID precautions at this time  HFlu Bacteremia-  Resolved: Finished course of abx yesterday. White count 29.9>>>10.9.   FEN/GI:Renal diet [id number] is: Inpatient  Remains inpatient appropriate because:Inpatient level of care appropriate due to severity of illness   Dispo: The patient is from: Home              Anticipated d/c is to: SNF              Anticipated d/c date is: 2 days              Patient currently is not medically stable to d/c.   Subjective:  Albert Harris reports feeling "fine" today but expresses frustration that he has not received adequate communication regarding the many aspects of his care. We discussed all elements of his care at length and he expresses relief and is amenable to the plan for colonoscopy today or tomorrow. He has no complaints.   Objective: Temp:  [97.6 F (36.4 C)-99.9 F (37.7 C)] 98.3 F (36.8 C) (01/20 0807) Pulse Rate:  [75-108] 94 (01/20 0807) Resp:  [18-24] 24 (01/20 0807) BP: (109-174)/(39-118) 140/76 (01/20 0807) SpO2:  [92 %-99 %] 94 % (01/20 0807) Weight:  [58.3 kg-62 kg] 58.3 kg (01/20 0643) Physical Exam: General: Chronically ill, but comfortable appearing resting in bed Cardiovascular: RRR, no m/r/g Respiratory: Lungs CTAB  Abdomen: Soft, non-tender, non-distended Extremities: Bilateral feet are warm, R toes without darkening noted on previous exam. R DP pulse remains weak compared to L.   Laboratory: Recent Labs  Lab 06/25/20 0843 06/25/20 1015 06/25/20 1832 06/26/20 0842 06/27/20 0225  WBC 22.9*  --   --  18.5* 10.9*  HGB 7.0*   < >  7.6* 8.1* 7.7*  HCT 22.8*   < > 23.0* 25.8* 23.9*  PLT 133*  --   --  156 168   < > = values in this interval not displayed.   Recent Labs  Lab 06/21/20 0446 06/21/20 1544 06/22/20 0404 06/22/20 1630 06/25/20 0257 06/26/20 0343 06/27/20 0225  NA 138   < > 137   < > 134* 135 141  K 3.6   < > 3.5   < > 4.6 5.0 4.2  CL 102   < > 101   < > 99 99 104  CO2 22   < > 25   < > 17* 15* 22  BUN 36*   < > 32*   < > 78*  109* 43*  CREATININE 3.38*   < > 2.52*   < > 5.43* 8.21* 4.88*  CALCIUM 7.7*   < > 7.6*   < > 6.5* 6.2* 7.2*  PROT 4.9*  --  4.7*  --   --   --   --   BILITOT 0.9  --  0.7  --   --   --   --   ALKPHOS 54  --  63  --   --   --   --   ALT 26  --  24  --   --   --   --   AST 66*  --  49*  --   --   --   --   GLUCOSE 103*   < > 155*   < > 111* 93 85   < > = values in this interval not displayed.    Imaging/Diagnostic Tests: Colonoscopy   Albert Harris, Medical Student 06/27/2020, 9:13 AM  Resident attestation: I agree with the documentation of Student Albert Harris above. I have made adjustments to his note as appropriate. I have seen the patient and performed physical exam on the patient consistent with his documented physical exam above.  Patient completing bowel prep with plan for colonoscopy. Also continues to work with renal on getting established with dialysis. Patient with no complaints for me this morning.  Albert Del, DO

## 2020-06-27 NOTE — Care Management Important Message (Signed)
Important Message  Patient Details  Name: Albert Harris MRN: [id number] Date of Birth: 11/13/1955   Medicare Important Message Given:  Yes - Important Message mailed due to current National Emergency    Verbal consent obtained due to current National Emergency  Relationship to patient: Self Contact Name: Lional Nicklaus Call Date: 06/27/20  Time: 1438 Phone: [phone] Outcome: No Answer/Busy Important Message mailed to: Patient address on file   [name] 06/27/2020, 2:38 PM

## 2020-06-27 NOTE — Anesthesia Postprocedure Evaluation (Signed)
Anesthesia Post Note  Patient: Albert Harris  Procedure(s) Performed: COLONOSCOPY WITH PROPOFOL (N/A ) HEMOSTASIS CLIP PLACEMENT     Patient location during evaluation: PACU Anesthesia Type: MAC Level of consciousness: awake and alert Pain management: pain level controlled Vital Signs Assessment: post-procedure vital signs reviewed and stable Respiratory status: spontaneous breathing, nonlabored ventilation and respiratory function stable Cardiovascular status: blood pressure returned to baseline and stable Postop Assessment: no apparent nausea or vomiting Anesthetic complications: no   No complications documented.  Last Vitals:  Vitals:   06/27/20 1540 06/27/20 1612  BP: 115/62 (!) 148/78  Pulse: 67 84  Resp: 20 (!) 21  Temp:  37 C  SpO2: 96% 99%    Last Pain:  Vitals:   06/27/20 1612  TempSrc: Oral  PainSc:                  Pervis Hocking

## 2020-06-27 NOTE — Transfer of Care (Signed)
Immediate Anesthesia Transfer of Care Note  Patient: Albert Harris  Procedure(s) Performed: COLONOSCOPY WITH PROPOFOL (N/A ) HEMOSTASIS CLIP PLACEMENT  Patient Location: PACU and Endoscopy Unit  Anesthesia Type:MAC  Level of Consciousness: confused and responds to stimulation  Airway & Oxygen Therapy: Patient Spontanous Breathing and Patient connected to face mask oxygen  Post-op Assessment: Report given to RN and Post -op Vital signs reviewed and stable  Post vital signs: Reviewed and stable  Last Vitals:  Vitals Value Taken Time  BP    Temp    Pulse    Resp    SpO2      Last Pain:  Vitals:   06/27/20 1439  TempSrc: Oral  PainSc: 0-No pain      Patients Stated Pain Goal: 3 (50/56/97 9480)  Complications: No complications documented.

## 2020-06-27 NOTE — Progress Notes (Signed)
FPTS Interim Progress Note  Discussed patient with Dr. Joelyn Oms, nephrology. Due to his vascular disease, his transfusion threshold should be Hgb 8.0. We will get a CBC in the am and if Hgb < 8, he will receive a unit of blood during HD tomorrow. Appreciate nephrology's assistance.   O: BP (!) 146/69 (BP Location: Left Arm)   Pulse 76   Temp 97.8 F (36.6 C) (Oral)   Resp 20   Ht '5\' 8"'$  (1.727 m)   Wt 58.3 kg   SpO2 92%   BMI 19.54 kg/m     Pearla Dubonnet, Medical Student 06/27/2020, 1:15 PM

## 2020-06-28 DIAGNOSIS — J069 Acute upper respiratory infection, unspecified: Secondary | ICD-10-CM | POA: Diagnosis not present

## 2020-06-28 DIAGNOSIS — L8990 Pressure ulcer of unspecified site, unspecified stage: Secondary | ICD-10-CM | POA: Insufficient documentation

## 2020-06-28 DIAGNOSIS — U071 COVID-19: Secondary | ICD-10-CM | POA: Diagnosis not present

## 2020-06-28 DIAGNOSIS — N179 Acute kidney failure, unspecified: Secondary | ICD-10-CM | POA: Diagnosis not present

## 2020-06-28 DIAGNOSIS — N185 Chronic kidney disease, stage 5: Secondary | ICD-10-CM | POA: Diagnosis not present

## 2020-06-28 LAB — CBC
HCT: 22.7 % — ABNORMAL LOW (ref 39.0–52.0)
Hemoglobin: 7.4 g/dL — ABNORMAL LOW (ref 13.0–17.0)
MCH: 30.3 pg (ref 26.0–34.0)
MCHC: 32.6 g/dL (ref 30.0–36.0)
MCV: 93 fL (ref 80.0–100.0)
Platelets: 183 10*3/uL (ref 150–400)
RBC: 2.44 MIL/uL — ABNORMAL LOW (ref 4.22–5.81)
RDW: 17.4 % — ABNORMAL HIGH (ref 11.5–15.5)
WBC: 9.4 10*3/uL (ref 4.0–10.5)
nRBC: 0 % (ref 0.0–0.2)

## 2020-06-28 LAB — RENAL FUNCTION PANEL
Albumin: 1.6 g/dL — ABNORMAL LOW (ref 3.5–5.0)
Anion gap: 16 — ABNORMAL HIGH (ref 5–15)
BUN: 60 mg/dL — ABNORMAL HIGH (ref 8–23)
CO2: 20 mmol/L — ABNORMAL LOW (ref 22–32)
Calcium: 7.2 mg/dL — ABNORMAL LOW (ref 8.9–10.3)
Chloride: 103 mmol/L (ref 98–111)
Creatinine, Ser: 7.13 mg/dL — ABNORMAL HIGH (ref 0.61–1.24)
GFR, Estimated: 8 mL/min — ABNORMAL LOW (ref >60)
Glucose, Bld: 138 mg/dL — ABNORMAL HIGH (ref 70–99)
Phosphorus: 6.4 mg/dL — ABNORMAL HIGH (ref 2.5–4.6)
Potassium: 3.8 mmol/L (ref 3.5–5.1)
Sodium: 139 mmol/L (ref 135–145)

## 2020-06-28 LAB — MAGNESIUM: Magnesium: 2.7 mg/dL — ABNORMAL HIGH (ref 1.7–2.4)

## 2020-06-28 LAB — GLUCOSE, CAPILLARY
Glucose-Capillary: 162 mg/dL — ABNORMAL HIGH (ref 70–99)
Glucose-Capillary: 89 mg/dL (ref 70–99)
Glucose-Capillary: 108 mg/dL — ABNORMAL HIGH (ref 70–99)

## 2020-06-28 MED ORDER — HEPARIN SODIUM (PORCINE) 1000 UNIT/ML DIALYSIS: 20.0000 [IU]/kg | INTRAMUSCULAR | Status: DC | PRN

## 2020-06-28 MED ORDER — CALCIUM ACETATE (PHOS BINDER) 667 MG PO CAPS
1334.0000 mg | ORAL_CAPSULE | Freq: Three times a day (TID) | ORAL | Status: DC
  Administered 2020-06-28 – 2020-07-02 (×13): 1334 mg via ORAL
  Filled 2020-06-28 (×13): qty 2

## 2020-06-28 MED ORDER — HEPARIN SODIUM (PORCINE) 1000 UNIT/ML DIALYSIS: 1000.0000 [IU] | INTRAMUSCULAR | Status: DC | PRN

## 2020-06-28 MED ORDER — LIDOCAINE HCL (PF) 1 % IJ SOLN
5.0000 mL | INTRAMUSCULAR | Status: DC | PRN
  Filled 2020-06-28: qty 5

## 2020-06-28 MED ORDER — SODIUM CHLORIDE 0.9 % IV SOLN: 100.0000 mL | INTRAVENOUS | Status: DC | PRN

## 2020-06-28 MED ORDER — HEPARIN SODIUM (PORCINE) 1000 UNIT/ML IJ SOLN
INTRAMUSCULAR | Status: AC
  Filled 2020-06-28: qty 6

## 2020-06-28 MED ORDER — LIDOCAINE-PRILOCAINE 2.5-2.5 % EX CREA
1.0000 "application " | TOPICAL_CREAM | CUTANEOUS | Status: DC | PRN
  Filled 2020-06-28: qty 5

## 2020-06-28 MED ORDER — PENTAFLUOROPROP-TETRAFLUOROETH EX AERO: 1.0000 "application " | INHALATION_SPRAY | CUTANEOUS | Status: DC | PRN

## 2020-06-28 MED ORDER — ALTEPLASE 2 MG IJ SOLR: 2.0000 mg | Freq: Once | INTRAMUSCULAR | Status: DC | PRN

## 2020-06-28 NOTE — Evaluation (Signed)
Physical Therapy Evaluation Patient Details Name: Albert Harris MRN: [id number] DOB: 1956-03-01 Today's Date: 06/28/2020   History of Present Illness  Pt is a 65 y.o. male admitted 06/16/20 with difficulty breathing. Workup for sepsis, acute hypoxic respiratory failure due to COVID-19 PNA. CRRT 1/12-1/16; IHD initiated 1/19. Pt with PAD s/p abdominal aortogram 1/14; per vascular, at high risk for RLE limb loss. Pt also with bloody stools; s/p colonoscopy with propofol on 1/20. PMH includes COPD, CKD V (HD MWF), HF, PAD, [name] (2019), HTN.    Clinical Impression  Pt presents with an overall decrease in functional mobility secondary to above. PTA, pt independent and lives with wife; multiple supportive family members nearby. Today, pt able to initiate transfer and gait training with RW, requiring intermittent minA to stand and prevent LOB. Called wife Albert Harris) at end of session to discuss d/c planning; family plans to have pt return home with 24/7 assist, interested in [facility] services. Pt would benefit from continued acute PT services to maximize functional mobility and independence prior to d/c with HHPT.     Follow Up Recommendations Home health PT;Supervision/Assistance - 24 hour    Equipment Recommendations  Rolling walker with 5" wheels;3in1 (PT)    Recommendations for Other Services OT consult     Precautions / Restrictions Precautions Precautions: Fall Restrictions Weight Bearing Restrictions: No      Mobility  Bed Mobility Overal bed mobility: Needs Assistance Bed Mobility: Supine to Sit     Supine to sit: Min assist     General bed mobility comments: MinA to assist trunk elevation    Transfers Overall transfer level: Needs assistance Equipment used: Rolling walker (2 wheeled) Transfers: Sit to/from Stand Sit to Stand: Min assist;Min guard         General transfer comment: Initially reliant on momentum and minA to power into standing to RW from EOB; additional trials  from recliner with min guard, cues for correct hand placement with RW  Ambulation/Gait Ambulation/Gait assistance: Min guard;Min assist Gait Distance (Feet): 100 Feet Assistive device: Rolling walker (2 wheeled) Gait Pattern/deviations: Step-through pattern;Decreased stride length;Staggering left;Staggering right Gait velocity: Decreased   General Gait Details: Slow, unsteady gait with RW and intermittent minA to prevent LOB; pt picking up RW to navigate around obstacles requiring cues for safety with this and to take slower steps with turning; 2x LOB requiring minA to correct with turns  Financial [employer] (Stroke Patients Only)       Balance Overall balance assessment: Needs assistance   Sitting balance-Leahy Scale: Good Sitting balance - Comments: Able to don socks sitting EOB     Standing balance-Leahy Scale: Poor Standing balance comment: Reliant on UE support                             Pertinent Vitals/Pain Pain Assessment: Faces Faces Pain Scale: Hurts a little bit Pain Location: Buttocks/sacrum sitting in recliner Pain Descriptors / Indicators: Discomfort;Guarding Pain Intervention(s): Repositioned;Other (comment) (pillow placed for padding)    Home Living Family/patient expects to be discharged to:: Private residence Living Arrangements: Spouse/significant other Available Help at Discharge: Family;Available 24 hours/day Type of Home: House Home Access: Stairs to enter   [employer] of Steps: 3 Home Layout: One level Home Equipment: Wheelchair - manual Additional Comments: Spoke with wife to confirm pt will have 24/7 family assist upon return  home    Prior Function Level of Independence: Independent               Hand Dominance        Extremity/Trunk Assessment   Upper Extremity Assessment Upper Extremity Assessment: Generalized weakness    Lower Extremity Assessment Lower  Extremity Assessment: Generalized weakness       Communication   Communication: HOH  Cognition Arousal/Alertness: Awake/alert Behavior During Therapy: WFL for tasks assessed/performed Overall Cognitive Status: No family/caregiver present to determine baseline cognitive functioning Area of Impairment: Attention;Safety/judgement;Awareness;Problem solving                   Current Attention Level: Selective     Safety/Judgement: Decreased awareness of safety;Decreased awareness of deficits Awareness: Emergent Problem Solving: Requires verbal cues General Comments: Following majority of simple commands and answering questions appropriately; requires some repetition of questions, potentially related to low health literacy and HOH      General Comments General comments (skin integrity, edema, etc.): Spoke with wife Albert Harris) on phone at end of session with patient to confirm d/c plans and pt's current assist/DME needs; wife reports she is confident pt can provide adequate assist, they are ready to have pt home    Exercises     Assessment/Plan    PT Assessment Patient needs continued PT services  PT Problem List Decreased strength;Decreased activity tolerance;Decreased balance;Decreased mobility;Decreased cognition;Decreased knowledge of use of DME;Decreased safety awareness       PT Treatment Interventions DME instruction;Gait training;Stair training;Functional mobility training;Therapeutic activities;Therapeutic exercise;Balance training;Patient/family education    PT Goals (Current goals can be found in the Care Plan section)  Acute Rehab PT Goals Patient Stated Goal: Return home with family support PT Goal Formulation: With patient/family Time For Goal Achievement: 07/12/20 Potential to Achieve Goals: Good    Frequency Min 3X/week   Barriers to discharge        Co-evaluation               AM-PAC PT "6 Clicks" Mobility  Outcome Measure Help needed turning  from your back to your side while in a flat bed without using bedrails?: None Help needed moving from lying on your back to sitting on the side of a flat bed without using bedrails?: A Little Help needed moving to and from a bed to a chair (including a wheelchair)?: A Little Help needed standing up from a chair using your arms (e.g., wheelchair or bedside chair)?: A Little Help needed to walk in hospital room?: A Little Help needed climbing 3-5 steps with a railing? : A Little 6 Click Score: 19    End of Session Equipment Utilized During Treatment: Gait belt Activity Tolerance: Patient tolerated treatment well Patient left: in chair;with call bell/phone within reach;with chair alarm set;with nursing/sitter in room Nurse Communication: Mobility status PT Visit Diagnosis: Other abnormalities of gait and mobility (R26.89);Muscle weakness (generalized) (M62.81)    Time: [id number] PT Time Calculation (min) (ACUTE ONLY): 35 min   Charges:   PT Evaluation $PT Eval Moderate Complexity: 1 Mod PT Treatments $Gait Training: 8-22 mins       Mabeline Caras, PT, DPT Acute Rehabilitation Services  Pager [phone] Office [place] 06/28/2020, 12:36 PM

## 2020-06-28 NOTE — Progress Notes (Signed)
Family Medicine Teaching Service Daily Progress Note Intern Pager: [phone]  Patient name: Albert Harris Medical record number: [id number] Date of birth: 11/08/1955 Age: 65 y.o. Gender: male  Primary Care Provider: Kerin Perna, NP Consultants: Nephrology, GI, VVS   Code Status: Full Code  Pt Overview and Major Events to Date:  1/9 admitted covid + 1/11 BCx pos for HFlu; transfused for Hgb 7.8 + BRBPR 1/12 Transferred to ICU 1/14 abdominal aortogram with stent 1/17 Tunneled IJ cath placed 1/18 transferred out of the ICU to FPTS  Assessment and Plan: Albert Harris is a 65 y.o. male who presented with acute hypoxemic respiratory failure due to Covid-19 pneumonia (now resolved) course complicated by lower GI bleed and ESRD. 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  Lower GI bleed Colonoscopy 1/20 with small rectal ulcer s/p clipping and large internal/external hemorrhoids. Improving, no blood in stool overnight. Hgb 7.4 this AM, plan for transfusion with HD today. - transfuse pRBC with HD per nephrology - transfusion threshold 8 - holding clopidogrel, will defer restarting to outpatient  CKD stage V now ESRD In the setting of Covid infection superimposed on advanced CKD. Received CRRT in the ICU 1/12-1/16. Received first HD 1/19. - continue HD MWF per nephrology - CLIP in process  Arterial occlusive disease Stable. Plan for bilateral LE bypass grafts outpatient per VVS. - statin - clopidogrel held  HTN Well-controlled. - metoprolol tartrate 25 mg BID  Covid-19 pneumonia Resolved, s/p remdesivir and dexamethasone.  H. Flu bacteremia Resolved, s/p antibiotics.  FEN/GI: renal diet PPx: [place]  Disposition: med-surg, dispo pending CLIP  Subjective:  NAOE.  Feels good overall.  No concerns.  Denies any bloody bowel movements overnight. Denies lightheadedness or dizziness. States that he has had  issues with hemorrhoids in the past, but they have not bothered him.  Objective: Temp:  [97.8 F (36.6 C)-98.7 F (37.1 C)] 98.6 F (37 C) (01/21 0350) Pulse Rate:  [67-95] 86 (01/21 0350) Resp:  [15-25] 23 (01/21 0350) BP: (109-148)/(61-80) 136/80 (01/21 0350) SpO2:  [89 %-100 %] 90 % (01/21 0350) Weight:  [59.8 kg] 59.8 kg (01/21 0157) Physical Exam: General: Alert, sitting up in bed, NAD Cardiovascular: RRR, no murmurs Respiratory: CTAB, no respiratory distress Abdomen: soft, non-tender, +BS Extremities: WWP, see images below        Laboratory: Recent Labs  Lab 06/26/20 0842 06/27/20 0225 06/28/20 0150  WBC 18.5* 10.9* 9.4  HGB 8.1* 7.7* 7.4*  HCT 25.8* 23.9* 22.7*  PLT 156 168 183   Recent Labs  Lab 06/22/20 0404 06/22/20 1630 06/26/20 0343 06/27/20 0225 06/28/20 0150  NA 137   < > 135 141 139  K 3.5   < > 5.0 4.2 3.8  CL 101   < > 99 104 103  CO2 25   < > 15* 22 20*  BUN 32*   < > 109* 43* 60*  CREATININE 2.52*   < > 8.21* 4.88* 7.13*  CALCIUM 7.6*   < > 6.2* 7.2* 7.2*  PROT 4.7*  --   --   --   --   BILITOT 0.7  --   --   --   --   ALKPHOS 63  --   --   --   --   ALT 24  --   --   --   --   AST 49*  --   --   --   --  GLUCOSE 155*   < > 93 85 138*   < > = values in this interval not displayed.     Imaging/Diagnostic Tests: No new imaging.  Zola Button, MD 06/28/2020, 7:09 AM PGY-1, [place] Intern pager: [phone], text pages welcome

## 2020-06-28 NOTE — Progress Notes (Signed)
Spouse Wayland Denis updated over the phone.

## 2020-06-28 NOTE — Progress Notes (Signed)
[facility] Progress Note     Assessment/ Plan:   # CKD stage V--now ESRD  - setting of covid superimposed on advanced CKD. CRRT started 1/12-1/16 urgently given worsening respiratory status and timing. Remains hemodynamically stable - First HD 06/26/20, tol well, cont on MWF COVID shift for now, plan for HD today. - CLIP in process - Maturing AVF, using TDC VVS placed  # Acute hypoxic respiratory failure - Secondary to covid PNA  -per primary service: remdesivir+steroids; improved  # Covid 19 PNA  - per primary service  # HTN  -BP acceptable, expecting drop/improvement with ultrafiltration  # Anemia of CKD - transfuse prn for hgb <8, cont ESA aranesp 60 qWed  # Secondary hyperparathyroidism  - PTH was 201 on 06/10/20. Increase phoslo   # Arterial occlusive disease -vvs on board, arteriogram 1/14 w/ intervention: angio and stent of right ext iliac artery, right common iliac artery, left common iliac art   Subjective:     Sitting on chair comfortable.  Denies nausea vomiting chest pain shortness of breath.  Plan for dialysis today.   Objective:   BP (!) 145/83 (BP Location: Right Arm)   Pulse 91   Temp 98 F (36.7 C) (Axillary)   Resp 20   Ht '5\' 8"'$  (1.727 m)   Wt 59.8 kg   SpO2 90%   BMI 20.05 kg/m   Intake/Output Summary (Last 24 hours) at 06/28/2020 1248 Last data filed at 06/28/2020 [id number] Gross per 24 hour  Intake 700 ml  Output 1 ml  Net 699 ml   Weight change: -2.2 kg  Physical Exam: GEN: NAD, comfortable HEENT: No conjunctival pallor, EOMI NECK: Supple, no thyromegaly LUNGS: CTA B/L no rales CV: RRR, No M/R/G ABD: SNDNT +BS  EXT: No lower extremity edema ACCESS: RIJ TDC and left BCF   Imaging: No results found.  Labs: BMET Recent Labs  Lab 06/23/20 1732 06/24/20 0309 06/24/20 1552 06/24/20 1956 06/25/20 0257 06/26/20 0343 06/27/20 0225 06/28/20 0150  NA 139 136 134*  --  134* 135 141 139  K 3.3* 3.6 4.7  --   4.6 5.0 4.2 3.8  CL 107 101 99  --  99 99 104 103  CO2 23 23 19*  --  17* 15* 22 20*  GLUCOSE 114* 114* 163*  --  111* 93 85 138*  BUN 25* 42* 57*  --  78* 109* 43* 60*  CREATININE 1.39* 2.80* 4.33*  --  5.43* 8.21* 4.88* 7.13*  CALCIUM 7.2* 7.7* 6.8*  --  6.5* 6.2* 7.2* 7.2*  PHOS 1.1* <1.0* 5.5* 4.7* 5.3* 8.3* 5.2* 6.4*   CBC Recent Labs  Lab 06/25/20 0843 06/25/20 1015 06/25/20 1832 06/26/20 0842 06/27/20 0225 06/28/20 0150  WBC 22.9*  --   --  18.5* 10.9* 9.4  HGB 7.0*   < > 7.6* 8.1* 7.7* 7.4*  HCT 22.8*   < > 23.0* 25.8* 23.9* 22.7*  MCV 100.0  --   --  93.5 93.0 93.0  PLT 133*  --   --  156 168 183   < > = values in this interval not displayed.    Medications:    . amLODipine  10 mg Oral Daily  . aspirin EC  81 mg Oral Daily  . atorvastatin  40 mg Oral q1800  . calcium acetate  667 mg Oral TID WC  . Chlorhexidine Gluconate Cloth  6 each Topical Q0600  . darbepoetin (ARANESP) injection - DIALYSIS  60 mcg Intravenous  Q Wed-HD  . feeding supplement (NEPRO CARB STEADY)  237 mL Oral BID BM  . heparin injection (subcutaneous)  5,000 Units Subcutaneous Q8H  . influenza vaccine adjuvanted  0.5 mL Intramuscular Tomorrow-1000  . metoprolol tartrate  25 mg Oral BID  . multivitamin  1 tablet Oral QHS  . pneumococcal 23 valent vaccine  0.5 mL Intramuscular Tomorrow-1000    Musab Wingard Tanna Furry, MD  06/28/2020, 12:48 PM

## 2020-06-28 NOTE — Progress Notes (Signed)
OT Cancellation Note  Patient Details Name: Albert Harris MRN: [id number] DOB: Oct 30, 1955   Cancelled Treatment:    Reason Eval/Treat Not Completed: Patient at procedure or test/ unavailable. Pt is in HD. Will reattempt.  Nilsa Nutting., OTR/L Acute Rehabilitation Services Pager [phone] Office [phone]   Lucille Passy M 06/28/2020, 3:32 PM

## 2020-06-28 NOTE — Progress Notes (Signed)
Renal Navigator has requested update from [facility] Admissions regarding status of referral, however, is aware that unfortunately outpatient dialysis seats are extremely limited at this time. Navigator will continue to follow closely.   Alphonzo Cruise, [place] Renal Navigator [phone]

## 2020-06-29 DIAGNOSIS — U071 COVID-19: Secondary | ICD-10-CM | POA: Diagnosis not present

## 2020-06-29 DIAGNOSIS — N179 Acute kidney failure, unspecified: Secondary | ICD-10-CM | POA: Diagnosis not present

## 2020-06-29 DIAGNOSIS — J069 Acute upper respiratory infection, unspecified: Secondary | ICD-10-CM | POA: Diagnosis not present

## 2020-06-29 DIAGNOSIS — N185 Chronic kidney disease, stage 5: Secondary | ICD-10-CM | POA: Diagnosis not present

## 2020-06-29 LAB — RENAL FUNCTION PANEL
Albumin: 1.7 g/dL — ABNORMAL LOW (ref 3.5–5.0)
Anion gap: 16 — ABNORMAL HIGH (ref 5–15)
BUN: 32 mg/dL — ABNORMAL HIGH (ref 8–23)
CO2: 22 mmol/L (ref 22–32)
Calcium: 7.6 mg/dL — ABNORMAL LOW (ref 8.9–10.3)
Chloride: 98 mmol/L (ref 98–111)
Creatinine, Ser: 5.59 mg/dL — ABNORMAL HIGH (ref 0.61–1.24)
GFR, Estimated: 11 mL/min — ABNORMAL LOW (ref >60)
Glucose, Bld: 92 mg/dL (ref 70–99)
Phosphorus: 4.5 mg/dL (ref 2.5–4.6)
Potassium: 4.3 mmol/L (ref 3.5–5.1)
Sodium: 136 mmol/L (ref 135–145)

## 2020-06-29 LAB — CBC
HCT: 23.9 % — ABNORMAL LOW (ref 39.0–52.0)
Hemoglobin: 7.6 g/dL — ABNORMAL LOW (ref 13.0–17.0)
MCH: 31 pg (ref 26.0–34.0)
MCHC: 31.8 g/dL (ref 30.0–36.0)
MCV: 97.6 fL (ref 80.0–100.0)
Platelets: 144 10*3/uL — ABNORMAL LOW (ref 150–400)
RBC: 2.45 MIL/uL — ABNORMAL LOW (ref 4.22–5.81)
RDW: 17.2 % — ABNORMAL HIGH (ref 11.5–15.5)
WBC: 8.6 10*3/uL (ref 4.0–10.5)
nRBC: 0 % (ref 0.0–0.2)

## 2020-06-29 LAB — MAGNESIUM: Magnesium: 2.3 mg/dL (ref 1.7–2.4)

## 2020-06-29 LAB — GLUCOSE, CAPILLARY
Glucose-Capillary: 108 mg/dL — ABNORMAL HIGH (ref 70–99)
Glucose-Capillary: 90 mg/dL (ref 70–99)

## 2020-06-29 LAB — HEMOGLOBIN AND HEMATOCRIT, BLOOD
HCT: 27.6 % — ABNORMAL LOW (ref 39.0–52.0)
Hemoglobin: 8.6 g/dL — ABNORMAL LOW (ref 13.0–17.0)

## 2020-06-29 LAB — PREPARE RBC (CROSSMATCH)

## 2020-06-29 MED ORDER — SODIUM CHLORIDE 0.9% IV SOLUTION: Freq: Once | INTRAVENOUS | Status: AC

## 2020-06-29 NOTE — Progress Notes (Signed)
RN returned wife's call Bethena Roys) who is concerned that patient may be depressed and lonely due to him not answering phone calls from family and was concerned about him. RN updated wife and informed her about hourly. Wife expressed gratitude and stated she will call the floor in the AM to try to speak with a doctor.

## 2020-06-29 NOTE — Plan of Care (Signed)
  Problem: Education: Goal: Knowledge of General Education information will improve Description: Including pain rating scale, medication(s)/side effects and non-pharmacologic comfort measures Outcome: Progressing   Problem: Health Behavior/Discharge Planning: Goal: Ability to manage health-related needs will improve Outcome: Progressing   Problem: Clinical Measurements: Goal: Ability to maintain clinical measurements within normal limits will improve Outcome: Progressing Goal: Will remain free from infection Outcome: Progressing Goal: Diagnostic test results will improve Outcome: Progressing Goal: Respiratory complications will improve Outcome: Progressing Goal: Cardiovascular complication will be avoided Outcome: Progressing   Problem: Activity: Goal: Risk for activity intolerance will decrease Outcome: Progressing   Problem: Nutrition: Goal: Adequate nutrition will be maintained Outcome: Progressing   Problem: Coping: Goal: Level of anxiety will decrease Outcome: Progressing   Problem: Elimination: Goal: Will not experience complications related to bowel motility Outcome: Progressing Goal: Will not experience complications related to urinary retention Outcome: Progressing   Problem: Pain Managment: Goal: General experience of comfort will improve Outcome: Progressing   Problem: Safety: Goal: Ability to remain free from injury will improve Outcome: Progressing   Problem: Skin Integrity: Goal: Risk for impaired skin integrity will decrease Outcome: Progressing   Problem: Education: Goal: Knowledge of risk factors and measures for prevention of condition will improve Outcome: Progressing   Problem: Coping: Goal: Psychosocial and spiritual needs will be supported Outcome: Progressing   Problem: Respiratory: Goal: Will maintain a patent airway Outcome: Progressing Goal: Complications related to the disease process, condition or treatment will be avoided or  minimized Outcome: Progressing   Problem: Education: Goal: Knowledge of General Education information will improve Description: Including pain rating scale, medication(s)/side effects and non-pharmacologic comfort measures Outcome: Progressing   Problem: Health Behavior/Discharge Planning: Goal: Ability to manage health-related needs will improve Outcome: Progressing   Problem: Clinical Measurements: Goal: Ability to maintain clinical measurements within normal limits will improve Outcome: Progressing Goal: Will remain free from infection Outcome: Progressing Goal: Diagnostic test results will improve Outcome: Progressing Goal: Respiratory complications will improve Outcome: Progressing Goal: Cardiovascular complication will be avoided Outcome: Progressing   Problem: Activity: Goal: Risk for activity intolerance will decrease Outcome: Progressing   Problem: Nutrition: Goal: Adequate nutrition will be maintained Outcome: Progressing   Problem: Coping: Goal: Level of anxiety will decrease Outcome: Progressing   Problem: Elimination: Goal: Will not experience complications related to bowel motility Outcome: Progressing Goal: Will not experience complications related to urinary retention Outcome: Progressing   Problem: Pain Managment: Goal: General experience of comfort will improve Outcome: Progressing   Problem: Safety: Goal: Ability to remain free from injury will improve Outcome: Progressing   Problem: Skin Integrity: Goal: Risk for impaired skin integrity will decrease Outcome: Progressing

## 2020-06-29 NOTE — Progress Notes (Signed)
[facility] Progress Note     Assessment/ Plan:   # CKD stage V--now ESRD  - setting of covid superimposed on advanced CKD. CRRT started 1/12-1/16 urgently given worsening respiratory status and timing. Remains hemodynamically stable - First HD 06/26/20, -Cont MWF COVID shift for now, s/p HD on 1/21tolerated well, next HD on 1/24.  - CLIP in process - Maturing AVF, using TDC VVS placed  # Acute hypoxic respiratory failure - Secondary to covid PNA  -per primary service: remdesivir+steroids; improved  # Covid 19 PNA  - per primary service  # HTN  -BP acceptable, expecting drop/improvement with ultrafiltration  # Anemia of CKD - transfuse prn for hgb <8, cont ESA aranesp 60 qWed  # Secondary hyperparathyroidism  - PTH was 201 on 06/10/20. Increased phoslo> now phos is better  # Arterial occlusive disease -vvs on board, arteriogram 1/14 w/ intervention: angio and stent of right ext iliac artery, right common iliac artery, left common iliac art   Subjective:     Tolerated dialysis well.  Denies nausea vomiting chest pain shortness of breath.  No new event.   Objective:   BP (!) 147/75 (BP Location: Right Arm)   Pulse 89   Temp 100.2 F (37.9 C) (Oral)   Resp 20   Ht '5\' 8"'$  (1.727 m)   Wt 59.1 kg   SpO2 91%   BMI 19.81 kg/m   Intake/Output Summary (Last 24 hours) at 06/29/2020 1133 Last data filed at 06/28/2020 2200 Gross per 24 hour  Intake 75 ml  Output 1700 ml  Net -1625 ml   Weight change: 0 kg  Physical Exam: GEN: NAD, comfortable HEENT: No conjunctival pallor, EOMI NECK: Supple, no thyromegaly LUNGS: CTA B/L no rales CV: RRR, No M/R/G ABD: SNDNT +BS  EXT: No lower extremity edema ACCESS: RIJ TDC and left BCF   Imaging: No results found.  Labs: BMET Recent Labs  Lab 06/24/20 0309 06/24/20 1552 06/24/20 1956 06/25/20 0257 06/26/20 0343 06/27/20 0225 06/28/20 0150 06/29/20 0556  NA 136 134*  --  134* 135 141 139  136  K 3.6 4.7  --  4.6 5.0 4.2 3.8 4.3  CL 101 99  --  99 99 104 103 98  CO2 23 19*  --  17* 15* 22 20* 22  GLUCOSE 114* 163*  --  111* 93 85 138* 92  BUN 42* 57*  --  78* 109* 43* 60* 32*  CREATININE 2.80* 4.33*  --  5.43* 8.21* 4.88* 7.13* 5.59*  CALCIUM 7.7* 6.8*  --  6.5* 6.2* 7.2* 7.2* 7.6*  PHOS <1.0* 5.5* 4.7* 5.3* 8.3* 5.2* 6.4* 4.5   CBC Recent Labs  Lab 06/26/20 0842 06/27/20 0225 06/28/20 0150 06/29/20 0556  WBC 18.5* 10.9* 9.4 8.6  HGB 8.1* 7.7* 7.4* 7.6*  HCT 25.8* 23.9* 22.7* 23.9*  MCV 93.5 93.0 93.0 97.6  PLT 156 168 183 144*    Medications:    . sodium chloride   Intravenous Once  . amLODipine  10 mg Oral Daily  . aspirin EC  81 mg Oral Daily  . atorvastatin  40 mg Oral q1800  . calcium acetate  1,334 mg Oral TID WC  . Chlorhexidine Gluconate Cloth  6 each Topical Q0600  . darbepoetin (ARANESP) injection - DIALYSIS  60 mcg Intravenous Q Wed-HD  . feeding supplement (NEPRO CARB STEADY)  237 mL Oral BID BM  . heparin injection (subcutaneous)  5,000 Units Subcutaneous Q8H  . influenza vaccine adjuvanted  0.5 mL Intramuscular Tomorrow-1000  . metoprolol tartrate  25 mg Oral BID  . multivitamin  1 tablet Oral QHS  . pneumococcal 23 valent vaccine  0.5 mL Intramuscular Tomorrow-1000    Katy Brickell Tanna Furry, MD  06/29/2020, 11:33 AM

## 2020-06-29 NOTE — Progress Notes (Signed)
Family Medicine Teaching Service Daily Progress Note Intern Pager: [phone]  Patient name: Albert Harris Medical record number: [id number] Date of birth: 03-18-1956 Age: 65 y.o. Gender: male  Primary Care Provider: Kerin Perna, NP Consultants: Nephrology, GI, VVS   Code Status: Full Code  Pt Overview and Major Events to Date:  1/9 admitted covid + 1/11 BCx pos for HFlu; transfused for Hgb 7.8 + BRBPR 1/12 Transferred to ICU 1/14 abdominal aortogram with stent 1/17 Tunneled IJ cath placed 1/18 transferred out of the ICU to [place], 1u pRBC 1/19 HD #1 1/22 1u pRBC  Assessment and Plan: IMHOTEP SCORE is a 65 y.o. male who presented with acute hypoxemic respiratory failure due to Covid-19 pneumonia (now resolved) course complicated by lower GI bleed and ESRD. 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  Fever Patient febrile to 101.4 F last night, afebrile this morning.  Patient without any new symptoms and was unaware that he had a fever.  He had been febrile on admission, but has remained afebrile since then up until last night.  Possibly related to COVID-19 infection.  Will monitor for now and obtain further work-up if becoming febrile again. - if febrile again, will obtain CXR, blood cx, urine cx  Lower GI bleed Colonoscopy 1/20 with small rectal ulcer s/p clipping and large internal/external hemorrhoids. Has not had any blood in stools since colonoscopy. Hgb 7.6, appears he did not receive transfusion with HD yesterday so will go ahead and transfuse today. - transfuse 1u pRBC with post-transfusion H/H - transfusion threshold 8 - holding clopidogrel, will defer restarting to outpatient  CKD stage V now ESRD In the setting of Covid infection superimposed on advanced CKD. Received CRRT in the ICU 1/12-1/16. Received first HD 1/19. - continue HD MWF per nephrology - CLIP in process  Arterial occlusive  disease Stable. Plan for bilateral LE bypass grafts outpatient per VVS. - statin - clopidogrel held  HTN Hypertensive to SBP 140s-150s last night and this AM but normotensive prior.  Will monitor for now, consider adding on another agent if remaining persistently hypertensive. - metoprolol tartrate 25 mg BID  Covid-19 pneumonia S/p remdesivir and dexamethasone.  Febrile again overnight, possibly related to COVID-19 infection.  H. Flu bacteremia Resolved, s/p antibiotics.  We will repeat blood cultures if becoming febrile again.  FEN/GI: renal diet PPx: [place]  Disposition: med-surg, home with services, dispo pending CLIP  Subjective:  Last night around 8:00 PM, patient spiked a fever to 101.4 F.  When asked about the fever, patient states that he was unaware that he had a fever and denies any new symptoms.  He does have a cough which is chronic in nature and not worsened.  Denies blood in stools.  Denies lightheadedness or dizziness.  Patient asking about going home.  Reassured patient that we are working on getting him set up for outpatient dialysis and that we will try to discharge him from the hospital as soon as we are able to.  Objective: Temp:  [98 F (36.7 C)-101.4 F (38.6 C)] 100.2 F (37.9 C) (01/22 0505) Pulse Rate:  [75-98] 89 (01/22 0505) Resp:  [20-28] 20 (01/22 0505) BP: (108-158)/(66-85) 147/75 (01/22 0505) SpO2:  [89 %-97 %] 91 % (01/22 0505) Weight:  [58.1 kg-59.8 kg] 59.1 kg (01/22 0554) Physical Exam: General: Alert, sitting up in bed, NAD Cardiovascular: RRR, no murmurs Respiratory: Bibasilar rales, breathing comfortably on room  air, no respiratory distress Abdomen: soft, non-tender, +BS Extremities: WWP, no significant change in feet from yesterday   Laboratory: Recent Labs  Lab 06/27/20 0225 06/28/20 0150 06/29/20 0556  WBC 10.9* 9.4 8.6  HGB 7.7* 7.4* 7.6*  HCT 23.9* 22.7* 23.9*  PLT 168 183 144*   Recent Labs  Lab 06/27/20 0225  06/28/20 0150 06/29/20 0556  NA 141 139 136  K 4.2 3.8 4.3  CL 104 103 98  CO2 22 20* 22  BUN 43* 60* 32*  CREATININE 4.88* 7.13* 5.59*  CALCIUM 7.2* 7.2* 7.6*  GLUCOSE 85 138* 92     Imaging/Diagnostic Tests: No new imaging.  Zola Button, MD 06/29/2020, 7:48 AM PGY-1, [place] Intern pager: [phone], text pages welcome

## 2020-06-29 NOTE — Progress Notes (Signed)
Family Medicine Teaching Service Daily Progress Note Intern Pager: [phone]  Patient name: Albert Harris Medical record number: [id number] Date of birth: 09-Oct-1955 Age: 65 y.o. Gender: male  Primary Care Provider: Kerin Perna, NP Consultants: Nephrology, GI, VVS   Code Status: Full Code  Pt Overview and Major Events to Date:  1/9 admitted covid + 1/11 BCx pos for HFlu; transfused for Hgb 7.8 + BRBPR 1/12 Transferred to ICU 1/14 abdominal aortogram with stent 1/17 Tunneled IJ cath placed 1/18 transferred out of the ICU to [place], 1u pRBC 1/19 HD #1 1/22 1u pRBC  Assessment and Plan: Albert Harris is a 65 y.o. male who presented with acute hypoxemic respiratory failure due to Covid-19 pneumonia (now resolved) course complicated by lower GI bleed and ESRD. 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  Fever. Resolved.  Patient febrile to 101.4 F 1/21, afebrile this morning. Possibly related to COVID-19 infection.  Will monitor for now and obtain further work-up if becoming febrile again. - if febrile again, will obtain CXR, blood cx, urine cx  Lower GI bleed Colonoscopy 1/20 with small rectal ulcer s/p clipping and large internal/external hemorrhoids. Has not had any blood in stools since colonoscopy. Hgb 8.2 s/p 1UpRBCs.  - holding clopidogrel, will defer restarting to outpatient - continue needed transfusions with HD below  CKD stage V now ESRD In the setting of Covid infection superimposed on advanced CKD. Received CRRT in the ICU 1/12-1/16. Received last HD 1/22. - continue HD MWF per nephrology - CLIP in process  Arterial occlusive disease Stable. Plan for bilateral LE bypass grafts outpatient per VVS. - statin - clopidogrel held  HTN Hypertensive to SBP 150s this AM but normotensive prior.  Will monitor for now, consider adding on another agent if remaining persistently hypertensive. -  metoprolol tartrate 25 mg BID  Covid-19 pneumonia S/p remdesivir and dexamethasone.  Febrile again overnight, possibly related to COVID-19 infection.  H. Flu bacteremia Resolved, s/p antibiotics.  We will repeat blood cultures if becoming febrile again.  FEN/GI: renal diet PPx: [place]  Disposition: med-surg, home with services, dispo pending CLIP, medically stable for discharge  Subjective:  No concern no acute events overnigt  Objective: Temp:  [98.5 F (36.9 C)-100.2 F (37.9 C)] 98.5 F (36.9 C) (01/22 1516) Pulse Rate:  [74-89] 78 (01/22 1516) Resp:  [19-20] 20 (01/22 1516) BP: (120-147)/(49-75) 140/49 (01/22 1516) SpO2:  [91 %-93 %] 91 % (01/22 1516) Weight:  [59.1 kg] 59.1 kg (01/22 0554)  Physical Exam: General: Sleeping easily aroused, no acute distress. Age appropriate. Cardiac: RRR, normal heart sounds, no murmurs Respiratory: Diffuse low pitch expiratory wheezing, normal effort Abdomen: soft, nontender, nondistended, NABS Extremities: No edema or cyanosis. Skin: Warm and dry, no rashes noted Neuro: alert and oriented, no focal deficits Psych: normal affect  Laboratory: Recent Labs  Lab 06/28/20 0150 06/29/20 0556 06/29/20 1626 06/30/20 0203  WBC 9.4 8.6  --  10.2  HGB 7.4* 7.6* 8.6* 8.2*  HCT 22.7* 23.9* 27.6* 24.6*  PLT 183 144*  --  207   Recent Labs  Lab 06/28/20 0150 06/29/20 0556 06/30/20 0203  NA 139 136 135  K 3.8 4.3 4.4  CL 103 98 98  CO2 20* 22 23  BUN 60* 32* 51*  CREATININE 7.13* 5.59* 7.38*  CALCIUM 7.2* 7.6* 7.8*  GLUCOSE 138* 92 98     Imaging/Diagnostic Tests: No new imaging.  Autry-Lott,  Naaman Plummer, DO 06/30/2020, 6:31 AM PGY-2, [place] Intern pager: [phone], text pages welcome

## 2020-06-30 DIAGNOSIS — D649 Anemia, unspecified: Secondary | ICD-10-CM

## 2020-06-30 DIAGNOSIS — K922 Gastrointestinal hemorrhage, unspecified: Secondary | ICD-10-CM

## 2020-06-30 DIAGNOSIS — N186 End stage renal disease: Secondary | ICD-10-CM | POA: Diagnosis not present

## 2020-06-30 LAB — GLUCOSE, CAPILLARY
Glucose-Capillary: 79 mg/dL (ref 70–99)
Glucose-Capillary: 85 mg/dL (ref 70–99)
Glucose-Capillary: 94 mg/dL (ref 70–99)
Glucose-Capillary: 80 mg/dL (ref 70–99)

## 2020-06-30 LAB — RENAL FUNCTION PANEL
Albumin: 1.6 g/dL — ABNORMAL LOW (ref 3.5–5.0)
Anion gap: 14 (ref 5–15)
BUN: 51 mg/dL — ABNORMAL HIGH (ref 8–23)
CO2: 23 mmol/L (ref 22–32)
Calcium: 7.8 mg/dL — ABNORMAL LOW (ref 8.9–10.3)
Chloride: 98 mmol/L (ref 98–111)
Creatinine, Ser: 7.38 mg/dL — ABNORMAL HIGH (ref 0.61–1.24)
GFR, Estimated: 8 mL/min — ABNORMAL LOW (ref >60)
Glucose, Bld: 98 mg/dL (ref 70–99)
Phosphorus: 4.9 mg/dL — ABNORMAL HIGH (ref 2.5–4.6)
Potassium: 4.4 mmol/L (ref 3.5–5.1)
Sodium: 135 mmol/L (ref 135–145)

## 2020-06-30 LAB — CBC
HCT: 24.6 % — ABNORMAL LOW (ref 39.0–52.0)
Hemoglobin: 8.2 g/dL — ABNORMAL LOW (ref 13.0–17.0)
MCH: 30.9 pg (ref 26.0–34.0)
MCHC: 33.3 g/dL (ref 30.0–36.0)
MCV: 92.8 fL (ref 80.0–100.0)
Platelets: 207 10*3/uL (ref 150–400)
RBC: 2.65 MIL/uL — ABNORMAL LOW (ref 4.22–5.81)
RDW: 17.1 % — ABNORMAL HIGH (ref 11.5–15.5)
WBC: 10.2 10*3/uL (ref 4.0–10.5)
nRBC: 0 % (ref 0.0–0.2)

## 2020-06-30 LAB — MAGNESIUM: Magnesium: 2.4 mg/dL (ref 1.7–2.4)

## 2020-06-30 MED ORDER — CHLORHEXIDINE GLUCONATE CLOTH 2 % EX PADS
6.0000 | MEDICATED_PAD | Freq: Every day | CUTANEOUS | Status: DC
  Administered 2020-07-01: 6 via TOPICAL

## 2020-06-30 NOTE — Progress Notes (Signed)
[facility] Progress Note     Assessment/ Plan:   # CKD stage V--now ESRD  - setting of covid superimposed on advanced CKD. CRRT started 1/12-1/16 urgently given worsening respiratory status and timing. Remains hemodynamically stable - First HD 06/26/20, -Cont MWF COVID shift for now, s/p HD on 1/21, tolerated well, next HD on 1/24, ordered.  - CLIP in process - Maturing AVF, using TDC VVS placed  # Acute hypoxic respiratory failure - Secondary to covid PNA  -per primary service: remdesivir+steroids; improved  # Covid 19 PNA  - per primary service  # HTN  -BP acceptable, expecting drop/improvement with ultrafiltration  # Anemia of CKD - transfuse prn for hgb <8, cont ESA aranesp 60 qWed  # Secondary hyperparathyroidism  - PTH was 201 on 06/10/20. Increased phoslo> now phos is better  # Arterial occlusive disease -vvs on board, arteriogram 1/14 w/ intervention: angio and stent of right ext iliac artery, right common iliac artery, left common iliac art   Subjective:     No new event.  Denies nausea vomiting chest pain shortness of breath.   Objective:   BP 138/62   Pulse 78   Temp 98.9 F (37.2 C) (Oral)   Resp 18   Ht '5\' 8"'$  (1.727 m)   Wt 58.7 kg   SpO2 96%   BMI 19.68 kg/m   Intake/Output Summary (Last 24 hours) at 06/30/2020 1308 Last data filed at 06/29/2020 1525 Gross per 24 hour  Intake 337.17 ml  Output --  Net 337.17 ml   Weight change: -1.1 kg  Physical Exam: GEN: NAD, comfortable HEENT: No conjunctival pallor, EOMI NECK: Supple, no thyromegaly LUNGS: CTA B/L no rales CV: RRR, No M/R/G ABD: SNDNT +BS  EXT: No lower extremity edema ACCESS: RIJ TDC and left BCF   Imaging: No results found.  Labs: BMET Recent Labs  Lab 06/24/20 1552 06/24/20 1956 06/25/20 0257 06/26/20 0343 06/27/20 0225 06/28/20 0150 06/29/20 0556 06/30/20 0203  NA 134*  --  134* 135 141 139 136 135  K 4.7  --  4.6 5.0 4.2 3.8 4.3 4.4   CL 99  --  99 99 104 103 98 98  CO2 19*  --  17* 15* 22 20* 22 23  GLUCOSE 163*  --  111* 93 85 138* 92 98  BUN 57*  --  78* 109* 43* 60* 32* 51*  CREATININE 4.33*  --  5.43* 8.21* 4.88* 7.13* 5.59* 7.38*  CALCIUM 6.8*  --  6.5* 6.2* 7.2* 7.2* 7.6* 7.8*  PHOS 5.5* 4.7* 5.3* 8.3* 5.2* 6.4* 4.5 4.9*   CBC Recent Labs  Lab 06/27/20 0225 06/28/20 0150 06/29/20 0556 06/29/20 1626 06/30/20 0203  WBC 10.9* 9.4 8.6  --  10.2  HGB 7.7* 7.4* 7.6* 8.6* 8.2*  HCT 23.9* 22.7* 23.9* 27.6* 24.6*  MCV 93.0 93.0 97.6  --  92.8  PLT 168 183 144*  --  207    Medications:    . amLODipine  10 mg Oral Daily  . aspirin EC  81 mg Oral Daily  . atorvastatin  40 mg Oral q1800  . calcium acetate  1,334 mg Oral TID WC  . Chlorhexidine Gluconate Cloth  6 each Topical Q0600  . darbepoetin (ARANESP) injection - DIALYSIS  60 mcg Intravenous Q Wed-HD  . feeding supplement (NEPRO CARB STEADY)  237 mL Oral BID BM  . heparin injection (subcutaneous)  5,000 Units Subcutaneous Q8H  . influenza vaccine adjuvanted  0.5 mL Intramuscular  Tomorrow-1000  . metoprolol tartrate  25 mg Oral BID  . multivitamin  1 tablet Oral QHS  . pneumococcal 23 valent vaccine  0.5 mL Intramuscular Tomorrow-1000    Albert Harris Tanna Furry, MD  06/30/2020, 1:08 PM

## 2020-07-01 ENCOUNTER — Encounter (HOSPITAL_COMMUNITY): Payer: Self-pay | Admitting: Gastroenterology

## 2020-07-01 DIAGNOSIS — N186 End stage renal disease: Secondary | ICD-10-CM | POA: Diagnosis not present

## 2020-07-01 DIAGNOSIS — U071 COVID-19: Secondary | ICD-10-CM | POA: Diagnosis not present

## 2020-07-01 DIAGNOSIS — J069 Acute upper respiratory infection, unspecified: Secondary | ICD-10-CM | POA: Diagnosis not present

## 2020-07-01 DIAGNOSIS — D649 Anemia, unspecified: Secondary | ICD-10-CM | POA: Diagnosis not present

## 2020-07-01 LAB — RENAL FUNCTION PANEL
Albumin: 1.5 g/dL — ABNORMAL LOW (ref 3.5–5.0)
Anion gap: 16 — ABNORMAL HIGH (ref 5–15)
BUN: 70 mg/dL — ABNORMAL HIGH (ref 8–23)
CO2: 21 mmol/L — ABNORMAL LOW (ref 22–32)
Calcium: 8 mg/dL — ABNORMAL LOW (ref 8.9–10.3)
Chloride: 98 mmol/L (ref 98–111)
Creatinine, Ser: 9.71 mg/dL — ABNORMAL HIGH (ref 0.61–1.24)
GFR, Estimated: 5 mL/min — ABNORMAL LOW
Glucose, Bld: 106 mg/dL — ABNORMAL HIGH (ref 70–99)
Phosphorus: 6.1 mg/dL — ABNORMAL HIGH (ref 2.5–4.6)
Potassium: 4.8 mmol/L (ref 3.5–5.1)
Sodium: 135 mmol/L (ref 135–145)

## 2020-07-01 LAB — CBC
HCT: 25.2 % — ABNORMAL LOW (ref 39.0–52.0)
Hemoglobin: 8 g/dL — ABNORMAL LOW (ref 13.0–17.0)
MCH: 30 pg (ref 26.0–34.0)
MCHC: 31.7 g/dL (ref 30.0–36.0)
MCV: 94.4 fL (ref 80.0–100.0)
Platelets: 205 K/uL (ref 150–400)
RBC: 2.67 MIL/uL — ABNORMAL LOW (ref 4.22–5.81)
RDW: 16.3 % — ABNORMAL HIGH (ref 11.5–15.5)
WBC: 11 K/uL — ABNORMAL HIGH (ref 4.0–10.5)
nRBC: 0 % (ref 0.0–0.2)

## 2020-07-01 LAB — MAGNESIUM: Magnesium: 2.6 mg/dL — ABNORMAL HIGH (ref 1.7–2.4)

## 2020-07-01 NOTE — Progress Notes (Signed)
Occupational Therapy Evaluation Patient Details Name: Albert Harris MRN: [id number] DOB: February 06, 1956 Today's Date: 07/01/2020    History of Present Illness Pt is a 65 y.o. male admitted 06/16/20 with difficulty breathing. Workup for sepsis, acute hypoxic respiratory failure due to COVID-19 PNA. CRRT 1/12-1/16; IHD initiated 1/19. Pt with PAD s/p abdominal aortogram 1/14; per vascular, at high risk for RLE limb loss. Pt also with bloody stools; s/p colonoscopy with propofol on 1/20. PMH includes COPD, CKD V (HD MWF), HF, PAD, [name] (2019), HTN.   Clinical Impression   PTA pt independent with ADL and mobility.Pt currently requires min A with mobility and ADL @ RW level and will benefit form follow up with HHOT due to deficits listed below. VSS during session on RA. Will follow acutely. Recommend staff walk with pt in room using RW and gait belt.     Follow Up Recommendations  Home health OT;Supervision/Assistance - 24 hour (initially)    Equipment Recommendations  3 in 1 bedside commode    Recommendations for Other Services       Precautions / Restrictions Precautions Precautions: Fall Restrictions Weight Bearing Restrictions: No      Mobility Bed Mobility Overal bed mobility: Modified Independent                  Transfers Overall transfer level: Needs assistance Equipment used: Rolling walker (2 wheeled) Transfers: Stand Pivot Transfers Sit to Stand: Min guard              Balance     Sitting balance-Leahy Scale: Good       Standing balance-Leahy Scale: Poor                             ADL either performed or assessed with clinical judgement   ADL Overall ADL's : Needs assistance/impaired     Grooming: Standing;Min guard   Upper Body Bathing: Set up;Sitting   Lower Body Bathing: Min guard;Sit to/from stand   Upper Body Dressing : Set up;Sitting   Lower Body Dressing: Min guard;Sit to/from stand   Toilet Transfer: Ambulation;RW;Min  guard   Toileting- Water quality scientist and Hygiene: Min guard       Functional mobility during ADLs: Min guard;Rolling walker;Cueing for safety       Vision         Perception     Praxis      Pertinent Vitals/Pain Pain Assessment: No/denies pain     Hand Dominance Right   Extremity/Trunk Assessment Upper Extremity Assessment Upper Extremity Assessment: Generalized weakness   Lower Extremity Assessment Lower Extremity Assessment: Defer to PT evaluation   Cervical / Trunk Assessment Cervical / Trunk Assessment: Normal   Communication Communication Communication: HOH   Cognition Arousal/Alertness: Awake/alert Behavior During Therapy: WFL for tasks assessed/performed;Flat affect Overall Cognitive Status: No family/caregiver present to determine baseline cognitive functioning Area of Impairment: Attention;Safety/judgement;Awareness;Problem solving;Orientation                 Orientation Level: Time Current Attention Level: Selective     Safety/Judgement: Decreased awareness of safety;Decreased awareness of deficits Awareness: Emergent Problem Solving: Requires verbal cues General Comments: very flat today, frustrated with still being in the hospital, poor awareness of deficit, and decreased endurance   General Comments  VSS; Increased WOB however pt states that is normal for him due to his CPOD    Exercises Exercises: Other exercises Other Exercises Other Exercises: educated on use of incentive  spirometer - completed x 5 - able to pull 1070m   Shoulder Instructions      Home Living Family/patient expects to be discharged to:: Private residence Living Arrangements: Spouse/significant other Available Help at Discharge: Family;Available 24 hours/day Type of Home: House Home Access: Stairs to enter [employer] Steps: 3 Entrance Stairs-Rails: Right Home Layout: One level     Bathroom Shower/Tub: TTeacher, early years/pre  Standard Bathroom Accessibility: Yes How Accessible: Accessible via walker Home Equipment: WTowner- 2 wheels;Wheelchair - manual          Prior Functioning/Environment Level of Independence: Independent        Comments: wife usually drives pt to dialysis        OT Problem List: Decreased strength;Decreased activity tolerance;Impaired balance (sitting and/or standing);Decreased cognition;Decreased safety awareness;Decreased knowledge of use of DME or AE;Cardiopulmonary status limiting activity;Decreased knowledge of precautions      OT Treatment/Interventions: Self-care/ADL training;Therapeutic exercise;Neuromuscular education;Energy conservation;DME and/or AE instruction;Therapeutic activities;Cognitive remediation/compensation;Patient/family education;Balance training    OT Goals(Current goals can be found in the care plan section) Acute Rehab OT Goals Patient Stated Goal: Return home with family support OT Goal Formulation: With patient Time For Goal Achievement: 07/15/20 Potential to Achieve Goals: Good  OT Frequency: Min 2X/week   Barriers to D/C:            Co-evaluation              AM-PAC OT "6 Clicks" Daily Activity     Outcome Measure Help from another person eating meals?: None Help from another person taking care of personal grooming?: A Little Help from another person toileting, which includes using toliet, bedpan, or urinal?: A Little Help from another person bathing (including washing, rinsing, drying)?: A Little Help from another person to put on and taking off regular upper body clothing?: A Little Help from another person to put on and taking off regular lower body clothing?: A Little 6 Click Score: 19   End of Session Equipment Utilized During Treatment: Gait belt;Rolling walker Nurse Communication: Mobility status  Activity Tolerance: Patient tolerated treatment well Patient left: in chair;with call bell/phone within reach;with chair alarm  set  OT Visit Diagnosis: Unsteadiness on feet (R26.81);Other abnormalities of gait and mobility (R26.89);Muscle weakness (generalized) (M62.81);Other symptoms and signs involving cognitive function                Time: 1412-1434 OT Time Calculation (min): 22 min Charges:  OT General Charges $OT Visit: 1 Visit OT Evaluation $OT Eval Moderate Complexity: 1Maynard OT/L   Acute OT Clinical Specialist Acute Rehabilitation Services Pager [phone] Office [phone]  [facility]1/24/2022, 6:41 PM

## 2020-07-01 NOTE — Progress Notes (Signed)
Family Medicine Teaching Service Daily Progress Note Intern Pager: [phone]  Patient name: Albert Harris Medical record number: [id number] Date of birth: 17-May-1956 Age: 65 y.o. Gender: male  Primary Care Provider: Kerin Perna, NP Consultants: Nephrology, GI, VVS Code Status: Full  Pt Overview and Major Events to Date:  1/9 admittedcovid + 1/11 BCxpos for HFlu; transfused for Hgb 7.8 + BRBPR 1/12Transferred to ICU 1/14 abdominal aortogram with stent 1/17 Tunneled IJ cath placed 1/18 transferred out of the ICU toFPTS, 1u pRBC 1/19 HD #1 1/22 1u pRBC  Assessment and Plan: Albert Harris is a 65 y.o. male who presented with acute hypoxemic respiratory failure due to Covid-19 pneumonia (now resolved) course complicated by lower GI bleed and ESRD. 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  Covid-19 pneumonia S/p remdesivir and dexamethasone. Patient Afebrile >48hrs. - if febrile again, will obtain CXR, blood cx, urine cx  Lower GI bleed No new reports of BRBPR since colonoscop 1/20. - AM CBC - holding clopidogrel, will defer restarting to outpatient - continue needed transfusions with HD below  CKD stage V now ESRD In the setting of Covid infection superimposed on advanced CKD. Received CRRT in the ICU 1/12-1/16. Received last HD 1/22.  Spoke with Nephrology who indicated dialysis will likely be tomorrow. - Plan for dialysis tomorrow - CLIP in process - Will look into if patient can be taken out of isolation givn he is now >14 days sicne testing positive ofr COVID  Arterial occlusive disease Stable. Plan for bilateral LE bypass grafts outpatient per VVS. - statin - clopidogrel held  HTN Hypertensive to SBP 140s-150 this AM.  Will monitor for now, consider adding on another agent if remaining persistently hypertensive. - metoprolol tartrate 25 mg BID  H. Flu bacteremia Resolved, s/p  antibiotics.  We will repeat blood cultures if becoming febrile again.  FEN/GI: renal diet PPx: SCD's  Status is: Inpatient  Remains inpatient appropriate because:Unsafe d/c plan   Dispo: The patient is from: Home              Anticipated d/c is to: Home              Anticipated d/c date is: 2 days              Patient currently is medically stable to d/c.   Difficult to place patient Yes    Subjective:  Patient has no complaints at this time.  Indicates leg and back pain have resolved.  Denies any chest pain or difficulty breathing.  Objective: Temp:  [98.5 F (36.9 C)-99.2 F (37.3 C)] 98.5 F (36.9 C) (01/24 0530) Pulse Rate:  [86-87] 87 (01/24 0530) Resp:  [18] 18 (01/24 0530) BP: (141-142)/(70-76) 142/70 (01/24 0530) SpO2:  [91 %-92 %] 91 % (01/24 0530) Weight:  [57 kg] 57 kg (01/24 0530) Physical Exam:  Physical Exam Constitutional:      General: He is not in acute distress.    Appearance: Normal appearance. He is not ill-appearing.  HENT:     Head: Normocephalic and atraumatic.     Mouth/Throat:     Mouth: Mucous membranes are moist.  Cardiovascular:     Rate and Rhythm: Normal rate and regular rhythm.     Comments: Pulses thready Pulmonary:     Effort: Pulmonary effort is normal.     Breath sounds: Normal breath sounds.  Abdominal:     General:  Abdomen is flat. There is no distension.     Palpations: Abdomen is soft.     Tenderness: There is no abdominal tenderness.  Musculoskeletal:     Right lower leg: No edema.     Left lower leg: No edema.  Skin:    General: Skin is warm.  Neurological:     Mental Status: He is alert.     Laboratory: Recent Labs  Lab 06/29/20 0556 06/29/20 1626 06/30/20 0203 07/01/20 0154  WBC 8.6  --  10.2 11.0*  HGB 7.6* 8.6* 8.2* 8.0*  HCT 23.9* 27.6* 24.6* 25.2*  PLT 144*  --  207 205   Recent Labs  Lab 06/29/20 0556 06/30/20 0203 07/01/20 0154  NA 136 135 135  K 4.3 4.4 4.8  CL 98 98 98  CO2 22 23 21*   BUN 32* 51* 70*  CREATININE 5.59* 7.38* 9.71*  CALCIUM 7.6* 7.8* 8.0*  GLUCOSE 92 98 106*     Imaging/Diagnostic Tests: No new imaging  Delora Fuel, MD 07/01/2020, 12:17 PM PGY-1, [facility] Intern pager: [phone], text pages welcome

## 2020-07-01 NOTE — Progress Notes (Signed)
Renal Navigator updated patient's wife regarding pending outpatient HD referral and will update as soon as a seat can be secured. Navigator understands that patient is ready for discharge and outside the 14 isolation window for COVID. Patient's wife confirms that patient has transportation. Wife appreciative of call.  Navigator will continue to follow closely.  Alphonzo Cruise, [facility]al Navigator [phone]

## 2020-07-01 NOTE — Progress Notes (Signed)
[facility] Progress Note     Assessment/ Plan:   # CKD stage V--now ESRD  - setting of covid superimposed on advanced CKD. CRRT started 1/12-1/16 urgently given worsening respiratory status and timing. Remains hemodynamically stable - First HD 06/26/20 -Cont MWF COVID shift for now, s/p HD on 1/21, tolerated well, next HD on 1/24, ordered but due to inpt volume may be delayed until tomorrow  - CLIP in process - Maturing AVF, using TDC VVS placed  # Acute hypoxic respiratory failure - Secondary to covid PNA now on RA -per primary service: remdesivir+steroids; improved  # Covid 19 PNA  - per primary service  # HTN  -BP acceptable, expecting drop/improvement with ultrafiltration  # Anemia of CKD - transfuse prn for hgb <8, cont ESA aranesp 60 qWed  # Secondary hyperparathyroidism  - PTH was 201 on 06/10/20. Increased phoslo> now phos is better  # Arterial occlusive disease -vvs on board, arteriogram 1/14 w/ intervention: angio and stent of right ext iliac artery, right common iliac artery, left common iliac art   Subjective:     No new event.  Denies nausea vomiting chest pain shortness of breath.   Objective:   BP (!) 142/70 (BP Location: Right Arm)   Pulse 87   Temp 98.5 F (36.9 C) (Oral)   Resp 18   Ht '5\' 8"'$  (1.727 m)   Wt 57 kg   SpO2 91%   BMI 19.11 kg/m   Intake/Output Summary (Last 24 hours) at 07/01/2020 1102 Last data filed at 06/30/2020 2200 Gross per 24 hour  Intake 720 ml  Output 120 ml  Net 600 ml   Weight change: -1.7 kg  Physical Exam: GEN: NAD, comfortable HEENT: No conjunctival pallor, EOMI NECK: Supple, no JVD LUNGS: CTA B/L no rales CV: RRR, No M/R/G ABD: SNDNT +BS  EXT: No lower extremity edema ACCESS: RIJ TDC and left BCF   Imaging: No results found.  Labs: BMET Recent Labs  Lab 06/25/20 0257 06/26/20 0343 06/27/20 0225 06/28/20 0150 06/29/20 0556 06/30/20 0203 07/01/20 0154  NA 134* 135 141  139 136 135 135  K 4.6 5.0 4.2 3.8 4.3 4.4 4.8  CL 99 99 104 103 98 98 98  CO2 17* 15* 22 20* 22 23 21*  GLUCOSE 111* 93 85 138* 92 98 106*  BUN 78* 109* 43* 60* 32* 51* 70*  CREATININE 5.43* 8.21* 4.88* 7.13* 5.59* 7.38* 9.71*  CALCIUM 6.5* 6.2* 7.2* 7.2* 7.6* 7.8* 8.0*  PHOS 5.3* 8.3* 5.2* 6.4* 4.5 4.9* 6.1*   CBC Recent Labs  Lab 06/28/20 0150 06/29/20 0556 06/29/20 1626 06/30/20 0203 07/01/20 0154  WBC 9.4 8.6  --  10.2 11.0*  HGB 7.4* 7.6* 8.6* 8.2* 8.0*  HCT 22.7* 23.9* 27.6* 24.6* 25.2*  MCV 93.0 97.6  --  92.8 94.4  PLT 183 144*  --  207 205    Medications:    . amLODipine  10 mg Oral Daily  . aspirin EC  81 mg Oral Daily  . atorvastatin  40 mg Oral q1800  . calcium acetate  1,334 mg Oral TID WC  . Chlorhexidine Gluconate Cloth  6 each Topical Q0600  . darbepoetin (ARANESP) injection - DIALYSIS  60 mcg Intravenous Q Wed-HD  . feeding supplement (NEPRO CARB STEADY)  237 mL Oral BID BM  . heparin injection (subcutaneous)  5,000 Units Subcutaneous Q8H  . influenza vaccine adjuvanted  0.5 mL Intramuscular Tomorrow-1000  . metoprolol tartrate  25 mg Oral  BID  . multivitamin  1 tablet Oral QHS  . pneumococcal 23 valent vaccine  0.5 mL Intramuscular Tomorrow-1000    Justin Mend, MD  07/01/2020, 11:02 AM

## 2020-07-01 NOTE — Progress Notes (Signed)
Physical Therapy Treatment Patient Details Name: Albert Harris MRN: [id number] DOB: 05/12/1956 Today's Date: 07/01/2020    History of Present Illness Pt is a 65 y.o. male admitted 06/16/20 with difficulty breathing. Workup for sepsis, acute hypoxic respiratory failure due to COVID-19 PNA. CRRT 1/12-1/16; IHD initiated 1/19. Pt with PAD s/p abdominal aortogram 1/14; per vascular, at high risk for RLE limb loss. Pt also with bloody stools; s/p colonoscopy with propofol on 1/20. PMH includes COPD, CKD V (HD MWF), HF, PAD, [name] (2019), HTN.    PT Comments    Pt laying on his side toward foot of bed agreeable to therapy however is very flat and relays that he is tired of being in the hospital and feels that he is just here for HD. Pt requires increased cuing for safety throughout session, pt is unaware of his decreased strength and endurance. Pt is min A for bed mobility, transfers and ambulation 100 feet with RW. Pt has one bout of knee buckling that requires modA for steadying. Once back in room pt gets back into bed refusing to sit in chair or participate in therapeutic exercise. D/c plans remain appropriate as pt will likely do better in his home environment. PT will continue to follow acutely.   Follow Up Recommendations  Home health PT;Supervision/Assistance - 24 hour     Equipment Recommendations  Rolling walker with 5" wheels;3in1 (PT)    Recommendations for Other Services OT consult     Precautions / Restrictions Precautions Precautions: Fall Restrictions Weight Bearing Restrictions: No    Mobility  Bed Mobility Overal bed mobility: Needs Assistance Bed Mobility: Supine to Sit;Sit to Supine     Supine to sit: Min assist Sit to supine: Min guard   General bed mobility comments: min A for pt to pull up against therapist to come to seated EoB, min guard for return to supine, increased effort to bring LE into bed  Transfers Overall transfer level: Needs assistance Equipment  used: Rolling walker (2 wheeled) Transfers: Sit to/from Stand Sit to Stand: Min assist         General transfer comment: initially reaches both hands up to RW and attempts to pull up, pt redirected to use one hand to push off from the bed and requires min A for power up  Ambulation/Gait Ambulation/Gait assistance: Min assist;Mod assist Gait Distance (Feet): 100 Feet Assistive device: Rolling walker (2 wheeled) Gait Pattern/deviations: Step-through pattern;Decreased stride length;Staggering left;Staggering right Gait velocity: Decreased   General Gait Details: light min A due to mild instability, increased assist for safety with turning and mod A for 1x L knee buckling, at end of ambulation pt has no recollection of knee buckling       Balance Overall balance assessment: Needs assistance   Sitting balance-Leahy Scale: Good       Standing balance-Leahy Scale: Poor Standing balance comment: Reliant on UE support                            Cognition Arousal/Alertness: Awake/alert Behavior During Therapy: WFL for tasks assessed/performed;Flat affect Overall Cognitive Status: No family/caregiver present to determine baseline cognitive functioning Area of Impairment: Attention;Safety/judgement;Awareness;Problem solving                   Current Attention Level: Selective     Safety/Judgement: Decreased awareness of safety;Decreased awareness of deficits Awareness: Emergent Problem Solving: Requires verbal cues General Comments: very flat today, frustrated with still being  in the hospital, poor awareness of deficit, and decreased endurance         General Comments General comments (skin integrity, edema, etc.): VSS on RA      Pertinent Vitals/Pain Pain Assessment: Faces Faces Pain Scale: Hurts a little bit Pain Location: generalized Pain Descriptors / Indicators: Discomfort;Guarding Pain Intervention(s): Limited activity within patient's  tolerance;Monitored during session;Repositioned           PT Goals (current goals can now be found in the care plan section) Acute Rehab PT Goals Patient Stated Goal: Return home with family support PT Goal Formulation: With patient/family Time For Goal Achievement: 07/12/20 Potential to Achieve Goals: Good Progress towards PT goals: Progressing toward goals    Frequency    Min 3X/week      PT Plan Current plan remains appropriate       AM-PAC PT "6 Clicks" Mobility   Outcome Measure  Help needed turning from your back to your side while in a flat bed without using bedrails?: None Help needed moving from lying on your back to sitting on the side of a flat bed without using bedrails?: A Little Help needed moving to and from a bed to a chair (including a wheelchair)?: A Little Help needed standing up from a chair using your arms (e.g., wheelchair or bedside chair)?: A Little Help needed to walk in hospital room?: A Little Help needed climbing 3-5 steps with a railing? : A Little 6 Click Score: 19    End of Session Equipment Utilized During Treatment: Gait belt Activity Tolerance: Patient tolerated treatment well Patient left: with call bell/phone within reach;in bed;with bed alarm set Nurse Communication: Mobility status PT Visit Diagnosis: Other abnormalities of gait and mobility (R26.89);Muscle weakness (generalized) (M62.81)     Time: [id number] PT Time Calculation (min) (ACUTE ONLY): 17 min  Charges:  $Gait Training: 8-22 mins                     Melenda Bielak B. Migdalia Dk PT, DPT Acute Rehabilitation Services Pager [phone] Office [phone]    [place] 07/01/2020, 12:50 PM

## 2020-07-01 NOTE — Care Management Important Message (Signed)
Important Message  Patient Details  Name: Albert Harris MRN: [id number] Date of Birth: June 30, 1955   Medicare Important Message Given:  Yes - Important Message mailed due to current National Emergency   Verbal consent obtained due to current National Emergency  Relationship to patient: Self Contact Name: Deangelo Kissick Call Date: 07/01/20  Time: 1507 Phone: [phone] Outcome: Spoke with contact Important Message mailed to: Patient address on file    [name] 07/01/2020, 3:07 PM

## 2020-07-02 ENCOUNTER — Other Ambulatory Visit (HOSPITAL_COMMUNITY): Payer: Self-pay | Admitting: Family Medicine

## 2020-07-02 DIAGNOSIS — N186 End stage renal disease: Secondary | ICD-10-CM | POA: Diagnosis not present

## 2020-07-02 DIAGNOSIS — K922 Gastrointestinal hemorrhage, unspecified: Secondary | ICD-10-CM | POA: Diagnosis not present

## 2020-07-02 LAB — CBC
HCT: 22.5 % — ABNORMAL LOW (ref 39.0–52.0)
Hemoglobin: 7.5 g/dL — ABNORMAL LOW (ref 13.0–17.0)
MCH: 32.3 pg (ref 26.0–34.0)
MCHC: 33.3 g/dL (ref 30.0–36.0)
MCV: 97 fL (ref 80.0–100.0)
Platelets: 231 10*3/uL (ref 150–400)
RBC: 2.32 MIL/uL — ABNORMAL LOW (ref 4.22–5.81)
RDW: 16.1 % — ABNORMAL HIGH (ref 11.5–15.5)
WBC: 10.7 10*3/uL — ABNORMAL HIGH (ref 4.0–10.5)
nRBC: 0 % (ref 0.0–0.2)

## 2020-07-02 LAB — HEMOGLOBIN AND HEMATOCRIT, BLOOD
HCT: 26.9 % — ABNORMAL LOW (ref 39.0–52.0)
Hemoglobin: 9.3 g/dL — ABNORMAL LOW (ref 13.0–17.0)

## 2020-07-02 LAB — MAGNESIUM: Magnesium: 2.8 mg/dL — ABNORMAL HIGH (ref 1.7–2.4)

## 2020-07-02 LAB — PREPARE RBC (CROSSMATCH)

## 2020-07-02 LAB — HEPATITIS B CORE ANTIBODY, TOTAL: Hep B Core Total Ab: NONREACTIVE

## 2020-07-02 LAB — HEPATITIS B SURFACE ANTIBODY,QUALITATIVE: Hep B S Ab: NONREACTIVE

## 2020-07-02 MED ORDER — ATORVASTATIN CALCIUM 40 MG PO TABS: 40.0000 mg | ORAL_TABLET | Freq: Every day | ORAL | 0 refills | Status: DC

## 2020-07-02 MED ORDER — ASPIRIN 81 MG PO TBEC: 81.0000 mg | DELAYED_RELEASE_TABLET | Freq: Every day | ORAL | 11 refills | Status: DC

## 2020-07-02 MED ORDER — HEPARIN SODIUM (PORCINE) 1000 UNIT/ML IJ SOLN
INTRAMUSCULAR | Status: AC
  Filled 2020-07-02: qty 4

## 2020-07-02 MED ORDER — ALBUTEROL SULFATE HFA 108 (90 BASE) MCG/ACT IN AERS: 1.0000 | INHALATION_SPRAY | Freq: Four times a day (QID) | RESPIRATORY_TRACT | 0 refills | Status: DC | PRN

## 2020-07-02 MED ORDER — CALCIUM ACETATE (PHOS BINDER) 667 MG PO CAPS: 1334.0000 mg | ORAL_CAPSULE | Freq: Three times a day (TID) | ORAL | 0 refills | Status: DC

## 2020-07-02 MED ORDER — GUAIFENESIN-DM 100-10 MG/5ML PO SYRP
5.0000 mL | ORAL_SOLUTION | ORAL | Status: DC | PRN
  Administered 2020-07-02: 5 mL via ORAL
  Filled 2020-07-02: qty 5

## 2020-07-02 NOTE — TOC Initial Note (Signed)
Transition of Care [facility]) - Initial/Assessment Note    Patient Details  Name: KHAMREN GRAULICH MRN: [id number] Date of Birth: 05-25-1956  [facility]) CM/SW Contact:    Carles Collet, RN Phone Number: 07/02/2020, 3:38 PM  Clinical Narrative:                Spoke to patient over the phone to discuss DC plan. He conforms he is from home w wife. She will provide transport home for when ready. Discussed HH recs and DME recs. Discussed [facility] provider ratings. Referral accepted by [facility], patient states that he has RW and access to [facility] at home, he would like 3/1. Order placed and requested for it to be brought to uni to go home w patient.    Expected Discharge Plan: [place] Barriers to Discharge: Continued Medical Work up   Patient Goals and CMS Choice Patient states their goals for this hospitalization and ongoing recovery are:: to go home CMS Medicare.gov Compare Post Acute Care list provided to:: Patient Choice offered to / list presented to : Patient  Expected Discharge Plan and Services Expected Discharge Plan: [place]   Discharge Planning Services: CM Consult Post Acute Care Choice: [place] arrangements for the past 2 months: Single Family Home                 DME Arranged: 3-N-1 DME Agency: [employer] Date DME Agency Contacted: 07/02/20 Time DME Agency Contacted: [phone] Representative spoke with at DME Agency: [place]: PT,OT [place]: [place] Date [facility] Agency Contacted: 07/02/20 Time [place]: 1538 Representative spoke with at [place]: Tommi Rumps  Prior Living Arrangements/Services Living arrangements for the past 2 months: Single Family Home Lives with:: Spouse                   Activities of Daily Living Home Assistive Devices/Equipment: None ADL Screening (condition at time of admission) Patient's cognitive ability adequate to safely complete daily  activities?: Yes Is the patient deaf or have difficulty hearing?: No Does the patient have difficulty seeing, even when wearing glasses/contacts?: No Does the patient have difficulty concentrating, remembering, or making decisions?: No Patient able to express need for assistance with ADLs?: Yes Does the patient have difficulty dressing or bathing?: Yes Independently performs ADLs?: Yes (appropriate for developmental age) Does the patient have difficulty walking or climbing stairs?: No Weakness of Legs: Both Weakness of Arms/Hands: Both  Permission Sought/Granted                  Emotional Assessment              Admission diagnosis:  Dyspnea [R06.00] Acute renal failure superimposed on stage 5 chronic kidney disease, not on chronic dialysis, unspecified acute renal failure type ([place]) [N17.9, N18.5] Acute respiratory disease due to COVID-19 virus [U07.1, J06.9] COVID-19 [U07.1] Patient Active Problem List   Diagnosis Date Noted  . ESRD (end stage renal disease) ([place])   . Lower GI bleed   . Pressure injury of skin 06/28/2020  . Acute renal failure superimposed on stage 5 chronic kidney disease, not on chronic dialysis ([place])   . Acute respiratory disease due to COVID-19 virus 06/16/2020  . CKD (chronic kidney disease) stage 5, GFR less than 15 ml/min (HCC) 03/19/2020  . Claudication in peripheral vascular disease ([place]) 09/12/2019  . Carotid stenosis 09/12/2019  . PFO (patent foramen ovale) 08/10/2019  .  Noncompliance 08/10/2019  . Hx of transient ischemic attack ([name]) 08/10/2019  . Chronic diastolic heart failure ([place])   . Elevated troponin   . Hypertensive emergency   . Acute respiratory failure with hypoxia ([place]) 07/18/2019  . Acute pulmonary edema ([place]) 07/18/2019  . ARF (acute renal failure) ([place]) 07/18/2019  . Anemia 07/18/2019  . Right sided weakness 11/09/2017  . CKD (chronic kidney disease), stage III ([place]) 11/09/2017  . Tobacco abuse 01/19/2017  .  Hypertensive urgency 12/24/2016  . [name] (transient ischemic attack) 12/24/2016   PCP:  Kerin Perna, NP Pharmacy:   [facility], [place] [address] [place] [address] Phone: [phone] Fax: [phone]     Social Determinants of Health (SDOH) Interventions    Readmission Risk Interventions No flowsheet data found.

## 2020-07-02 NOTE — Progress Notes (Signed)
Patient has been accepted at [facility] HD clinic on a TTS schedule with a seat time of 10:30am. His first day in the clinic will be Thursday, 07/04/20 and he needs to arrive at 9:30am this day in order to complete intake paperwork. Navigator called patient's wife who is understanding of plan and very appreciative. She again confirms that she will be providing patient's transportation. She states no questions. Patient is past 14 day COVID isolation window.  Navigator updated Nephrologist and Attending and asked Renal PA to send orders. Clinic aware of plan.   Alphonzo Cruise, [place] Renal Navigator [phone]

## 2020-07-02 NOTE — Progress Notes (Signed)
No new concerns. He is asking to go home to his wife. No new findings on the exam. If approved by nephro, he can be d/ced home as discussed with him.  Of note, his hemoglobin dropped to 7.5. We will transfuse 1 unit of PRBC today and obtain post-transfusion hemoglobin. He will need to recheck CBC with dialysis/nephrology or PCP. He will also benefit from GI f/u as an outpatient. He does have a PCP appointment set up for 07/16/20  @ 9:30   ESRD: Outpatient dialysis per Nephro.

## 2020-07-02 NOTE — Discharge Instructions (Signed)
Dear Albert Harris,  Thank you for letting us participate in your care. You were hospitalized for COVID-19 and had a GI bleed and diagnosed with End Stage Renal Disease. You were treated with Remdesivir and Decadron and started on dialysis while here in the hospital.  You were also treated for a bacterial infection and underwent a colonoscopy due to bleeds with your bowel movements.    POST-HOSPITAL & CARE INSTRUCTIONS 1. Will have Physical Therapist and Occupational Therapist come to see at home. 2. Go to your follow up appointments (listed below). 3. Continue to follow-up with your dialysis after dialysis. 4. Patient will be fully COVID cleared on 07/04/20.  Recommend continuing to isolate and wear mask around those you cannot isolate from until this time.  Also, we stognly suggest obtaining COVID Vaccine after being COVID cleared on 1/27. 5. Follow-up with PCP about restarting Clopidogrel given concern for bleed with bowel movement vs. Risk of blood clot.  6. Please continue to take your medications as indicated.  Continue your home medications in addition to taking Aspirin and Atorvastatin.   DOCTOR'S APPOINTMENT   Future Appointments  Date Time Provider [place]  07/16/2020  9:30 AM Albert Perna, NP [facility] None    Follow-up [place], [facility] Follow up on 07/04/2020.   Why: Your outpatient dialysis schedule is every Tues/Thurs/Sat at 10:30am. Please arrive on your first day at 9:30am Thursday, 07/04/20. Please bring photo ID, insurance card and social security card with you. You may want to bring a pillow and blanket.  Contact information: [place] [address] [phone]        Care, [facility] Follow up.   Specialty: [place] Why: for home health services, they will call you in the next 2 daysto set up a home appointment Contact information: [place]  [phone]               Take care and be well!  [facility]  [place],  [address] [phone]

## 2020-07-02 NOTE — Discharge Summary (Signed)
[facility] Discharge Summary  Patient name: Albert Harris Medical record number: [id number] Date of birth: Aug 29, 1955 Age: 65 y.o. Gender: male Date of Admission: 06/16/2020  Date of Discharge: 07/02/20 Admitting Physician: Richarda Osmond, DO  Primary Care Provider: Kerin Perna, NP Consultants: CCM, Nephrology, GI, VVS  Indication for Hospitalization: BRBPR  Discharge Diagnoses/Problem List: ESRD on HD, HTN, HFpEF, [name], tobacco use, COPD, PAD, HLD, carotid stenosis.   Disposition: Able to be discharged home safely  Discharge Condition: Stable  Discharge Exam:   Physical Exam Constitutional:      General: He is not in acute distress.    Appearance: He is not ill-appearing.  HENT:     Head: Normocephalic and atraumatic.     Mouth/Throat:     Mouth: Mucous membranes are moist.  Cardiovascular:     Rate and Rhythm: Normal rate and regular rhythm.     Pulses: Normal pulses.  Pulmonary:     Effort: Pulmonary effort is normal.     Breath sounds: Normal breath sounds.  Skin:    General: Skin is warm.     Capillary Refill: Capillary refill takes less than 2 seconds.  Neurological:     General: No focal deficit present.     Mental Status: He is alert and oriented to person, place, and time.  Psychiatric:        Mood and Affect: Mood normal.        Behavior: Behavior normal.     Brief Hospital Course:  Patient admitted to [facility] June 16, 2020 with acute hypoxic respiratory failure meeting sepsis criteria due to COVID 19.  Acute hypoxic respiratory failure 2/2 COVID-19 Patient was tachypneic in the ED and had a rapidly increasing O2 requirement.  Tested positive for COVID-19.  Chest x-ray demonstrated multifocal pneumonia and asymmetric pulmonary edema.  Patient was started on remdesivir and Decadron 6 mg daily on 1/09.  Was transferred to ICU on 1/12 given respiratory distress and worsening pain. His respiratory status  was stabilized using supplemental O2 and he did not require mechanical ventilation.  Transferred out of ICU back to floor and did not require further oxygen supplementation.  Patient was discharged with home health PT & OT.  CKD 5 now ESRD Patient had previously been CKD5 and had not started HD, but had a graft placed in 10/21. On admission, patient had Cr. 15, BUN 86, and GFR 3 and was still producing urine. Nephro was consulted for urgent HD. However, his fistula was found to be infiltrated. He was started on CRRT via a RIJ temp line placed 1/12.  He was maintained on CRRT from 1/12-1/16.  He received his first HD 1/19 and was maintained on MWF HD schedule with outpatient dialysis arranged.  Acute on chronic anemia  Lower GI bleed Patient presented with hemoglobin 8.5, decreased to 8 on repeat in the setting of BRBPR.  Baseline appears to be around 10.  FOBT was negative.  He underwent colonoscopy on 1/20 with findings of a small 3 mm rectal ulcer s/p clipping and large internal/external hemorrhoids.  He received a total of 4 units pRBC to maintain Hgb threshold of 8.  He had no further episodes of bloody stool since his colonoscopy.  Severe PAD He underwent angioplasty and stenting of the right external iliac artery, right common iliac artery, and left common iliac artery on 1/14.  Patients pain greatly improved following angioplasty.  H. Flu bacteremia Patient was noted to have  beta-lactamase negative haemophilus influenza growing in his blood cultures collected on 1/9.  He was treated with 6 days of ampicillin followed by 3 days of CTX.    Issues for Follow Up:  1. Continue to monitor Hgb when patient receiving dialysis.  Continue to manage Anemia of Chronic Disease in the setting of ESRD.   2. Have discussion with patient regarding restarting Clopidogrel in setting of PAD vs risk of future GI bleeds and anemia.  Significant Procedures: Colonoscopy, BILATERAL ILIAC ANGIOPLASTY AND  STENTING  Significant Labs and Imaging:  Recent Labs  Lab 06/30/20 0203 07/01/20 0154 07/02/20 0200 07/02/20 1621  WBC 10.2 11.0* 10.7*  --   HGB 8.2* 8.0* 7.5* 9.3*  HCT 24.6* 25.2* 22.5* 26.9*  PLT 207 205 231  --    Recent Labs  Lab 06/27/20 0225 06/28/20 0150 06/29/20 0556 06/30/20 0203 07/01/20 0154 07/02/20 0200  NA 141 139 136 135 135  --   K 4.2 3.8 4.3 4.4 4.8  --   CL 104 103 98 98 98  --   CO2 22 20* 22 23 21*  --   GLUCOSE 85 138* 92 98 106*  --   BUN 43* 60* 32* 51* 70*  --   CREATININE 4.88* 7.13* 5.59* 7.38* 9.71*  --   CALCIUM 7.2* 7.2* 7.6* 7.8* 8.0*  --   MG 2.5* 2.7* 2.3 2.4 2.6* 2.8*  PHOS 5.2* 6.4* 4.5 4.9* 6.1*  --   ALBUMIN 1.6* 1.6* 1.7* 1.6* 1.5*  --      Results/Tests Pending at Time of Discharge: None  Discharge Medications:  Allergies as of 07/02/2020   No Known Allergies     Medication List    STOP taking these medications   furosemide 20 MG tablet Commonly known as: LASIX     TAKE these medications   acetaminophen 650 MG CR tablet Commonly known as: TYLENOL Take 1,300 mg by mouth every 8 (eight) hours as needed for pain or fever.   albuterol 108 (90 Base) MCG/ACT inhaler Commonly known as: VENTOLIN HFA Inhale 1-2 puffs into the lungs every 6 (six) hours as needed for wheezing or shortness of breath.   amLODipine 10 MG tablet Commonly known as: NORVASC Take 1 tablet (10 mg total) by mouth daily.   aspirin 81 MG EC tablet Take 1 tablet (81 mg total) by mouth daily. Swallow whole. Start taking on: July 03, 2020   atorvastatin 40 MG tablet Commonly known as: LIPITOR Take 1 tablet (40 mg total) by mouth daily at 6 PM.   calcium acetate 667 MG capsule Commonly known as: PHOSLO Take 2 capsules (1,334 mg total) by mouth 3 (three) times daily with meals.   metoprolol tartrate 25 MG tablet Commonly known as: LOPRESSOR Take 25 mg by mouth 2 (two) times daily.   nitroGLYCERIN 0.4 MG SL tablet Commonly known as:  NITROSTAT Place 1 tablet (0.4 mg total) under the tongue every 5 (five) minutes x 3 doses as needed for chest pain. What changed: when to take this   sodium bicarbonate 650 MG tablet Take 650 mg by mouth daily as needed for heartburn.       Discharge Instructions: Please refer to Patient Instructions section of EMR for full details.  Patient was counseled important signs and symptoms that should prompt return to medical care, changes in medications, dietary instructions, activity restrictions, and follow up appointments.   Follow-Up Appointments:  [place] Follow up on 07/04/2020.  Why: Your outpatient dialysis schedule is every Tues/Thurs/Sat at 10:30am. Please arrive on your first day at 9:30am Thursday, 07/04/20. Please bring photo ID, insurance card and social security card with you. You may want to bring a pillow and blanket.  Contact information: [place] [address] [phone]        Care, [facility] Follow up.   Specialty: [place] Why: for home health services, they will call you in the next 2 daysto set up a home appointment Contact information: [place] [address] [phone]               Delora Fuel, MD 07/02/2020, 11:39 PM PGY-1, [place]

## 2020-07-02 NOTE — Progress Notes (Incomplete)
Family Medicine Teaching Service Daily Progress Note Intern Pager: [phone]  Patient name: Albert Harris Medical record number: [id number] Date of birth: 20-Aug-1955 Age: 65 y.o. Gender: male  Primary Care Provider: Kerin Perna, NP Consultants: Nephrology, GI, VVS Code Status: Full  Pt Overview and Major Events to Date:  1/9 admittedcovid + 1/11 BCxpos for HFlu; transfused for Hgb 7.8 + BRBPR 1/12Transferred to ICU 1/14 abdominal aortogram with stent 1/17 Tunneled IJ cath placed 1/18 transferred out of the ICU toFPTS, 1u pRBC 1/19 HD #1 1/22 1u pRBC  Assessment and Plan: Deep Simerson Peedenis a 65 y.o.malewho presented with acute hypoxemic respiratory failure due to Covid-19 pneumonia (now resolved) course complicated by lower GI bleed and ESRD. 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  ***  Covid-19 pneumonia S/p remdesivir and dexamethasone.  Patient now >14 days since testing positive.  Currently symptom free and COVID cleared.   - Transfer  Lower GI bleed No new reports of BRBPR since colonoscop 1/20.  Unsure of source causing falling anemia.*** - AM CBC - holding clopidogrel, will defer restarting to outpatient - continue needed transfusions with HD below  CKD stage V now ESRD*** In the setting of Covid infection superimposed on advanced CKD. Received CRRT in the ICU 1/12-1/16. ReceivedHD last night.   - Plan for dialysis tomorrow - CLIP in process - Will look into if patient can be taken out of isolation givn he is now >14 days sicne testing positive ofr COVID  Arterial occlusive disease Stable. Plan for bilateral LE bypass grafts outpatient per VVS. - statin - clopidogrel held  HTN Hypertensive to SBP 140s-150 this AM. Will monitor for now, consider adding on another agent if remaining persistently hypertensive. - metoprolol tartrate 25 mg BID  H. Flu bacteremia Resolved,  s/p antibiotics. We will repeat blood cultures if becoming febrile again.  FEN/GI: renal diet PPx: SCD's  Status is: Inpatient  Remains inpatient appropriate because:Unsafe d/c plan   Dispo: The patient is from: Home              Anticipated d/c is to: Home              Anticipated d/c date is: 3 days              Patient currently is medically stable to d/c.   Difficult to place patient No      Subjective:  ***  Objective: Temp:  [97.9 F (36.6 C)-99.7 F (37.6 C)] 97.9 F (36.6 C) (01/25 0446) Pulse Rate:  [82-97] 91 (01/25 0446) Resp:  [17-35] 20 (01/25 0446) BP: (120-159)/(58-106) 143/71 (01/25 0446) SpO2:  [92 %-100 %] 94 % (01/25 0446) Weight:  [58 kg] 58 kg (01/25 0500) Physical Exam: General: *** Cardiovascular: *** Respiratory: *** Abdomen: *** Extremities: ***  Laboratory: Recent Labs  Lab 06/30/20 0203 07/01/20 0154 07/02/20 0200  WBC 10.2 11.0* 10.7*  HGB 8.2* 8.0* 7.5*  HCT 24.6* 25.2* 22.5*  PLT 207 205 231   Recent Labs  Lab 06/29/20 0556 06/30/20 0203 07/01/20 0154  NA 136 135 135  K 4.3 4.4 4.8  CL 98 98 98  CO2 22 23 21*  BUN 32* 51* 70*  CREATININE 5.59* 7.38* 9.71*  CALCIUM 7.6* 7.8* 8.0*  GLUCOSE 92 98 106*    ***  Imaging/Diagnostic Tests: Delora Fuel, MD 07/02/2020, 7:39 AM PGY-***, [place] Intern pager: [phone]*,  text pages welcome

## 2020-07-02 NOTE — Progress Notes (Signed)
[facility] Progress Note     Assessment/ Plan:   # CKD stage V--now ESRD  - setting of covid superimposed on advanced CKD. CRRT started 1/12-1/16 urgently given worsening respiratory status and timing. Remains hemodynamically stable - First HD 06/26/20 -Cont MWF COVID shift for now, s/p HD on 1/24, tolerated well, next HD on 1/27 outpt - Maturing AVF, using TDC VVS placed  # Acute hypoxic respiratory failure - Secondary to covid PNA now on RA -per primary service: remdesivir+steroids; improved  # Covid 19 PNA  - per primary service  # HTN  -BP acceptable  # Anemia of CKD - primary transfusing prn for hgb <8, cont ESA aranesp 60 qWed  # Secondary hyperparathyroidism  - PTH was 201 on 06/10/20. Increased phoslo> now phos is better  # Arterial occlusive disease -vvs on board, arteriogram 1/14 w/ intervention: angio and stent of right ext iliac artery, right common iliac artery, left common iliac art  Dispo - ok to d/c from nephrology perspective, has outpt HD arranged to start 1/27   Subjective:     No new event.  Denies nausea vomiting chest pain shortness of breath.  Excited to d/c.    Objective:   BP 112/66 (BP Location: Right Arm)   Pulse 91   Temp 98 F (36.7 C) (Oral)   Resp 18   Ht '5\' 8"'$  (1.727 m)   Wt 58 kg   SpO2 96%   BMI 19.44 kg/m   Intake/Output Summary (Last 24 hours) at 07/02/2020 1141 Last data filed at 07/02/2020 [id number] Gross per 24 hour  Intake 328 ml  Output 1000 ml  Net -672 ml   Weight change: 1 kg  Physical Exam: GEN: NAD, comfortable HEENT: No conjunctival pallor, EOMI NECK: Supple, no JVD LUNGS: CTA B/L no rales CV: RRR, No M/R/G ABD: SNDNT +BS  EXT: No lower extremity edema ACCESS: RIJ TDC and left BCF   Imaging: No results found.  Labs: BMET Recent Labs  Lab 06/26/20 0343 06/27/20 0225 06/28/20 0150 06/29/20 0556 06/30/20 0203 07/01/20 0154  NA 135 141 139 136 135 135  K 5.0 4.2 3.8 4.3  4.4 4.8  CL 99 104 103 98 98 98  CO2 15* 22 20* 22 23 21*  GLUCOSE 93 85 138* 92 98 106*  BUN 109* 43* 60* 32* 51* 70*  CREATININE 8.21* 4.88* 7.13* 5.59* 7.38* 9.71*  CALCIUM 6.2* 7.2* 7.2* 7.6* 7.8* 8.0*  PHOS 8.3* 5.2* 6.4* 4.5 4.9* 6.1*   CBC Recent Labs  Lab 06/29/20 0556 06/29/20 1626 06/30/20 0203 07/01/20 0154 07/02/20 0200  WBC 8.6  --  10.2 11.0* 10.7*  HGB 7.6* 8.6* 8.2* 8.0* 7.5*  HCT 23.9* 27.6* 24.6* 25.2* 22.5*  MCV 97.6  --  92.8 94.4 97.0  PLT 144*  --  207 205 231    Medications:    . amLODipine  10 mg Oral Daily  . aspirin EC  81 mg Oral Daily  . atorvastatin  40 mg Oral q1800  . calcium acetate  1,334 mg Oral TID WC  . Chlorhexidine Gluconate Cloth  6 each Topical Q0600  . darbepoetin (ARANESP) injection - DIALYSIS  60 mcg Intravenous Q Wed-HD  . feeding supplement (NEPRO CARB STEADY)  237 mL Oral BID BM  . heparin injection (subcutaneous)  5,000 Units Subcutaneous Q8H  . heparin sodium (porcine)      . influenza vaccine adjuvanted  0.5 mL Intramuscular Tomorrow-1000  . metoprolol tartrate  25 mg Oral  BID  . multivitamin  1 tablet Oral QHS  . pneumococcal 23 valent vaccine  0.5 mL Intramuscular Tomorrow-1000    Justin Mend, MD  07/02/2020, 11:41 AM

## 2020-07-03 ENCOUNTER — Telehealth: Payer: Self-pay

## 2020-07-03 LAB — BPAM RBC
Blood Product Expiration Date: 202202212359
Blood Product Expiration Date: 202202222359
ISSUE DATE / TIME: 202201221252
ISSUE DATE / TIME: 202201251131
Unit Type and Rh: 5100
Unit Type and Rh: 5100

## 2020-07-03 LAB — TYPE AND SCREEN
ABO/RH(D): O POS
Antibody Screen: NEGATIVE
Unit division: 0
Unit division: 0

## 2020-07-03 MED FILL — CALCIUM ACETATE (PHOS BINDE: 667 | 5 days supply | Qty: 30 | Fill #0

## 2020-07-03 MED FILL — ATORVASTATIN CALCIUM 40 MG: 40 | 30 days supply | Qty: 30 | Fill #0

## 2020-07-03 NOTE — Telephone Encounter (Signed)
Transition Care Management Unsuccessful Follow-up Telephone Call  Date of discharge and from where:  07/02/2020, [facility]  Attempts:  1st Attempt  Reason for unsuccessful TCM follow-up call:  Left voice message - call placed to # [phone] back requested to this CM.  Patient has appointment at [facility] 07/16/2020

## 2020-07-04 ENCOUNTER — Telehealth: Payer: Self-pay

## 2020-07-04 NOTE — Telephone Encounter (Signed)
Received phone call from [facility] regarding issues with dispensing albuterol rx.   Per pharmacist, quantity amount will need to be changed for either Proair or ventolin.   If ventolin is preferred, quantity will need to be changed to 18 g, however, if Proair is preferred, quantity will need to be changed to 8.5 g.   Forwarding to prescribing doctor.   Talbot Grumbling, RN

## 2020-07-04 NOTE — Telephone Encounter (Signed)
Transition Care Management Unsuccessful Follow-up Telephone Call  Date of discharge and from where:  07/02/2020, [facility]   Attempts:  2nd Attempt  Reason for unsuccessful TCM follow-up call:  Left voice message - call placed to # [phone], call back requested to this CM.  Patient has appointment with Juluis Mire, NP 07/16/2020.

## 2020-07-08 LAB — CULTURE, BLOOD (ROUTINE X 2)

## 2020-07-10 ENCOUNTER — Telehealth (INDEPENDENT_AMBULATORY_CARE_PROVIDER_SITE_OTHER): Payer: Medicare Other | Admitting: Primary Care

## 2020-07-16 ENCOUNTER — Ambulatory Visit (INDEPENDENT_AMBULATORY_CARE_PROVIDER_SITE_OTHER): Payer: Medicare Other | Admitting: Primary Care

## 2020-07-16 ENCOUNTER — Encounter (INDEPENDENT_AMBULATORY_CARE_PROVIDER_SITE_OTHER): Payer: Self-pay | Admitting: Primary Care

## 2020-07-16 ENCOUNTER — Other Ambulatory Visit: Payer: Self-pay

## 2020-07-16 VITALS — BP 191/97 | HR 76 | Temp 97.2°F | Ht 68.0 in | Wt 139.8 lb

## 2020-07-16 DIAGNOSIS — I10 Essential (primary) hypertension: Secondary | ICD-10-CM

## 2020-07-16 DIAGNOSIS — Z09 Encounter for follow-up examination after completed treatment for conditions other than malignant neoplasm: Secondary | ICD-10-CM

## 2020-07-16 DIAGNOSIS — N186 End stage renal disease: Secondary | ICD-10-CM

## 2020-07-16 NOTE — Progress Notes (Signed)
Sill has cough and SOB  Complains that he can not hear out of right ear  Pt has not taken Bp medications today was told not to take on dialysis days

## 2020-07-16 NOTE — Patient Instructions (Signed)

## 2020-07-16 NOTE — Progress Notes (Signed)
Albert Harris  [name] is a 65 y.o.male presents for follow up from the hospital. Admit date to the hospital was 06/16/20, patient was discharged from the hospital on 07/02/20, patient was admitted for: Acute hypoxic resp failure secondary to COVID-19 - patient unvaccinated and GI Bleed. Uncontrolled HTN -Denies shortness of breath, headaches, chest pain or lower extremity edema. On dialysis on Tuesday/Thursday and Saturday and instructed not to take Bp meds prior because it lowers BP.  Left hand numbness and feels cold all the time - shunt is in this arm + fill and Bruit.   Past Medical History:  Diagnosis Date  . Asthma   . Carotid stenosis 09/12/2019  . Claudication in peripheral vascular disease ([place]) 09/12/2019  . Complication of anesthesia    " i HAVE A HARD TIME WAKING UP "  . COPD (chronic obstructive pulmonary disease) ([place])   . GERD (gastroesophageal reflux disease)   . HLD (hyperlipidemia)   . Hypertension   . PFO with atrial septal aneurysm    by echo 11/2017  . Stroke ([place])   . [name] (transient ischemic attack) 11/2017  . Tobacco abuse    quit 07/17/19     No Known Allergies    Current Outpatient Medications on File Prior to Visit  Medication Sig Dispense Refill  . acetaminophen (TYLENOL) 650 MG CR tablet Take 1,300 mg by mouth every 8 (eight) hours as needed for pain or fever.    Marland Kitchen albuterol (VENTOLIN HFA) 108 (90 Base) MCG/ACT inhaler Inhale 1-2 puffs into the lungs every 6 (six) hours as needed for wheezing or shortness of breath. 8 g 0  . amLODipine (NORVASC) 10 MG tablet Take 1 tablet (10 mg total) by mouth daily. 30 tablet 3  . aspirin EC 81 MG EC tablet Take 1 tablet (81 mg total) by mouth daily. Swallow whole. 30 tablet 11  . atorvastatin (LIPITOR) 40 MG tablet Take 1 tablet (40 mg total) by mouth daily at 6 PM. 30 tablet 0  . calcium acetate (PHOSLO) 667 MG capsule Take 2 capsules (1,334 mg total) by mouth 3 (three) times daily with meals. 30  capsule 0  . metoprolol tartrate (LOPRESSOR) 25 MG tablet Take 25 mg by mouth 2 (two) times daily.    . nitroGLYCERIN (NITROSTAT) 0.4 MG SL tablet Place 1 tablet (0.4 mg total) under the tongue every 5 (five) minutes x 3 doses as needed for chest pain. (Patient taking differently: Place 0.4 mg under the tongue every 5 (five) minutes as needed for chest pain.) 30 tablet 0  . sodium bicarbonate 650 MG tablet Take 650 mg by mouth daily as needed for heartburn.     No current facility-administered medications on file prior to visit.    ROS: all negative except above.   Physical Exam: There were no vitals filed for this visit. There were no vitals taken for this visit. General Appearance: Well nourished, in no apparent distress. Eyes: PERRLA, EOMs, conjunctiva no swelling or erythema Sinuses: No Frontal/maxillary tenderness ENT/Mouth: Ext aud canals clear, TMs without erythema, bulging..Right ear cerumen   .Hard of  Hearing. Neck: Supple, thyroid normal.  Respiratory: Respiratory effort normal, BS equal bilaterally without rales, rhonchi, wheezing or stridor.  Cardio: RRR with no MRGs. Brisk peripheral pulses without edema.  Abdomen: Soft, + BS.  Non tender, no guarding, rebound, hernias, masses. Lymphatics: Non tender without lymphadenopathy.  Musculoskeletal: Full ROM, 5/5 strength, normal gait.  Skin: Warm, dry without rashes, lesions, ecchymosis.  Neuro: Cranial nerves intact. Normal muscle tone, no cerebellar symptoms. Sensation intact.  Psych: Awake and oriented X 3, normal affect, Insight and Judgment appropriate.    Albert Harris was seen today for post covid.  Diagnoses and all orders for this visit:  Hospital discharge follow-up Retrieved from Hospital discharge -He will need to recheck CBC with dialysis/nephrology or PCP. Deferred to nephrology he has an appointment after this and generally follow labs.  He will also benefit from GI f/u as an outpatient.   ESRD (end stage renal  disease) ([place]) He has dialysis Tuesday/Thursday and Saturday  Labs can be obtain at that visit today.   Essential hypertension Bp is elevated does not take medication on dialysis- Counseled on blood pressure goal of less than 130/80, low-sodium, DASH diet, medication compliance, 150 minutes of moderate intensity exercise per week as tolerated  Discussed medication compliance, adverse effects.    Kerin Perna, NP 9:49 AM

## 2020-07-19 ENCOUNTER — Telehealth: Payer: Self-pay

## 2020-07-19 ENCOUNTER — Other Ambulatory Visit: Payer: Self-pay

## 2020-07-19 DIAGNOSIS — N185 Chronic kidney disease, stage 5: Secondary | ICD-10-CM

## 2020-07-19 DIAGNOSIS — R200 Anesthesia of skin: Secondary | ICD-10-CM

## 2020-07-19 DIAGNOSIS — R202 Paresthesia of skin: Secondary | ICD-10-CM

## 2020-07-19 NOTE — Telephone Encounter (Signed)
Patient's wife called, he is s/p L AVF in October 2021. He was hospitalized in late January, and according to her, they attempted cannulation of the maturing fistula. Since then, his left hand has been swollen to twice the normal size, is whitish, and very painful. Advised to elevate and added on for PA schedule on [facility] day for dialysis access duplex and evaluation.

## 2020-07-23 ENCOUNTER — Encounter (HOSPITAL_COMMUNITY): Payer: Self-pay | Admitting: Vascular Surgery

## 2020-07-23 ENCOUNTER — Other Ambulatory Visit: Payer: Self-pay

## 2020-07-23 ENCOUNTER — Encounter: Payer: Self-pay | Admitting: Physician Assistant

## 2020-07-23 ENCOUNTER — Ambulatory Visit (INDEPENDENT_AMBULATORY_CARE_PROVIDER_SITE_OTHER): Payer: Medicare Other | Admitting: Physician Assistant

## 2020-07-23 ENCOUNTER — Ambulatory Visit (HOSPITAL_COMMUNITY)
Admission: RE | Admit: 2020-07-23 | Discharge: 2020-07-23 | Disposition: A | Discharge status: Home / Self Care | Payer: Medicare Other | Source: Ambulatory Visit | Attending: Vascular Surgery | Admitting: Vascular Surgery

## 2020-07-23 VITALS — BP 199/102 | HR 92 | Temp 98.3°F | Resp 20 | Ht 68.0 in | Wt 144.6 lb

## 2020-07-23 DIAGNOSIS — Z992 Dependence on renal dialysis: Secondary | ICD-10-CM

## 2020-07-23 DIAGNOSIS — R202 Paresthesia of skin: Secondary | ICD-10-CM | POA: Diagnosis not present

## 2020-07-23 DIAGNOSIS — R200 Anesthesia of skin: Secondary | ICD-10-CM | POA: Insufficient documentation

## 2020-07-23 DIAGNOSIS — I739 Peripheral vascular disease, unspecified: Secondary | ICD-10-CM

## 2020-07-23 DIAGNOSIS — N185 Chronic kidney disease, stage 5: Secondary | ICD-10-CM | POA: Insufficient documentation

## 2020-07-23 DIAGNOSIS — N186 End stage renal disease: Secondary | ICD-10-CM | POA: Diagnosis not present

## 2020-07-23 MED ORDER — OXYCODONE-ACETAMINOPHEN 5-325 MG PO TABS: 1.0000 | ORAL_TABLET | ORAL | 0 refills | Status: DC | PRN

## 2020-07-23 NOTE — Progress Notes (Signed)
Established Dialysis Access   History of Present Illness   Albert Harris is a 65 y.o. (22-May-1956) male who presents for evaluation of left brachiocephalic AV fistula that was created by Dr. Carlis Harris in October of 2021. He is now having significant pain, coldness and swelling that started in January. He said was suppose to be seen in January for this but then ended up getting COVID and was in ICU. Following his discharge it has continued to get worse. He says now it is very unbearable. He cannot sleep secondary to the pain. The pain is constant sharp shooting pain especially into the left thumb and middle fingers. His grip strength is weakened and hand is especially painful to even touch. He says the hand feels like he has it submerged in an ice bucket. Apparently during his hospitalization they attempted to cannulate the fistula several times without success. Currently using a right IJ [facility] for dialysis on TTS at the [place] location  His wife and he also report significant shooting pain into right 1st toe and black spot on the right 1st toe which has been present since he was hospitalized. He had undergone Aortogram with bilateral runoff, angioplasty and stenting of the right external iliac and common iliac arteries as well as angioplasty and stenting of the left common iliac artery on 06/22/19 by Dr. Scot Harris secondary to rest pain and no identifiable flow in the RLE by doppler. He says he does not really recall what happened during his hospitalization because of how sick he was but he knows that since then he has had continuous pain in the right foot. No increased pain on ambulation. Wife has been applying peroxide to the right 1st toe.   Current Outpatient Medications  Medication Sig Dispense Refill  . acetaminophen (TYLENOL) 650 MG CR tablet Take 1,300 mg by mouth every 8 (eight) hours as needed for pain or fever.    Marland Kitchen albuterol (VENTOLIN HFA) 108 (90 Base) MCG/ACT inhaler Inhale 1-2 puffs into  the lungs every 6 (six) hours as needed for wheezing or shortness of breath. 8 g 0  . amLODipine (NORVASC) 10 MG tablet Take 1 tablet (10 mg total) by mouth daily. 30 tablet 3  . aspirin EC 81 MG EC tablet Take 1 tablet (81 mg total) by mouth daily. Swallow whole. 30 tablet 11  . atorvastatin (LIPITOR) 40 MG tablet Take 1 tablet (40 mg total) by mouth daily at 6 PM. 30 tablet 0  . calcium acetate (PHOSLO) 667 MG capsule Take 2 capsules (1,334 mg total) by mouth 3 (three) times daily with meals. 30 capsule 0  . metoprolol tartrate (LOPRESSOR) 25 MG tablet Take 25 mg by mouth 2 (two) times daily.    . nitroGLYCERIN (NITROSTAT) 0.4 MG SL tablet Place 1 tablet (0.4 mg total) under the tongue every 5 (five) minutes x 3 doses as needed for chest pain. (Patient taking differently: Place 0.4 mg under the tongue every 5 (five) minutes as needed for chest pain.) 30 tablet 0  . sodium bicarbonate 650 MG tablet Take 650 mg by mouth daily as needed for heartburn.     No current facility-administered medications for this visit.    On ROS today: negative unless stated in HPI   Physical Examination   Vitals:   07/23/20 0830  BP: (!) 199/102  Pulse: 92  Resp: 20  Temp: 98.3 F (36.8 C)  TempSrc: Temporal  SpO2: 100%  Weight: 144 lb 9.6 oz (65.6 kg)  Height: '5\' 8"'$  (1.727 m)   Body mass index is 21.99 kg/m.  General WDWN, in discomfort but not in acute distress  Pulmonary Non labored  Cardiac Regular rate and rhythm, no murmur  Vascular Vessel Right Left  Radial Palpable Palpable  Brachial Palpable Palpable  Ulnar Not palpable Not palpable  Doppler left radial faint monophasic, ulnar monophasic as well as palmar arteries. Right had with brisk radial, ulnar and palmar signals. Left had very tender to palpation. Left hand is warm. 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 Motor and sensation intact. Right foot warm. Doppler PT and peroneal  signals.   M/S 5/5 throughout  , Extremities without ischemic changes    Neurologic A&O; CN grossly intact     Non-invasive Vascular Imaging   left Arm Access Duplex  (07/23/20):   Diameters:  0.47-0.58 mm  Depth:  0.33- 0.51m  PSV:  371 c/s  Summary:  Patent brachiocephalic AVF. Multiple small branches in the proximal and mid upper arm.  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 The radial artery is retrograde. The distal radial artery demonstrates a "thumpy" waveform and is small and atherosclerotic.   Medical Decision Making   Albert MOROCCOis a 65y.o. male who presents due to pain, swelling and coldness in left upper extremity. He has left Brachiocephalic AV fistula placed in October by Dr. CCarlis Harris This became painful after his hospitalization in early January for [place] He now has unbearable steal symptoms. I dont think his fistula would be amenable to banding to salvage it. I discussed with patient and his wife that best option is probably to have this fistula ligated to resolve his symptoms. He will subsequently need a new upper extremity vein mapping. Prior vein mapping showed adequate right upper extremity vein conduits which will probably be his best option. [facility]. He additionally is having some RLE rest pain with dry gangrene. He has follow up arranged with Dr. DScot Dockon 08/15/19 for follow up following his angiogram with angioplasty and stenting of bilateral iliac arteries. Instructed patients wife to pain toes with betadine. Keep toes protected Pain medication percocet 5-325 mg #8 no refills was sent to patients pharmacy His left upper extremity AV fistula ligation has been scheduled for 07/24/20 with Dr. CTracey Harries PA-C [facility] [place]: [phone] Clinic MD: Dr. CCarlis Harris

## 2020-07-23 NOTE — Progress Notes (Signed)
Spoke with pt for pre-op call. Pt tested positive for Covid on 06/16/20 and was admitted to the hospital. States he "pretty sick".  Pt states he is not diabetic. Denies any cardiac history.

## 2020-07-24 ENCOUNTER — Encounter (HOSPITAL_COMMUNITY): Payer: Self-pay | Admitting: Vascular Surgery

## 2020-07-24 ENCOUNTER — Ambulatory Visit (HOSPITAL_COMMUNITY): Payer: Medicare Other | Admitting: Anesthesiology

## 2020-07-24 ENCOUNTER — Ambulatory Visit (HOSPITAL_COMMUNITY)
Admission: RE | Admit: 2020-07-24 | Discharge: 2020-07-24 | Disposition: A | Discharge status: Home / Self Care | Payer: Medicare Other | Attending: Vascular Surgery | Admitting: Vascular Surgery

## 2020-07-24 ENCOUNTER — Encounter (HOSPITAL_COMMUNITY)
Admission: RE | Disposition: A | Discharge status: Home / Self Care | Payer: Self-pay | Source: Home / Self Care | Attending: Vascular Surgery

## 2020-07-24 DIAGNOSIS — I96 Gangrene, not elsewhere classified: Secondary | ICD-10-CM | POA: Insufficient documentation

## 2020-07-24 DIAGNOSIS — Y712 Prosthetic and other implants, materials and accessory cardiovascular devices associated with adverse incidents: Secondary | ICD-10-CM | POA: Diagnosis not present

## 2020-07-24 DIAGNOSIS — Z95820 Peripheral vascular angioplasty status with implants and grafts: Secondary | ICD-10-CM | POA: Diagnosis not present

## 2020-07-24 DIAGNOSIS — M79602 Pain in left arm: Secondary | ICD-10-CM | POA: Diagnosis not present

## 2020-07-24 DIAGNOSIS — N186 End stage renal disease: Secondary | ICD-10-CM

## 2020-07-24 DIAGNOSIS — T82898A Other specified complication of vascular prosthetic devices, implants and grafts, initial encounter: Secondary | ICD-10-CM

## 2020-07-24 DIAGNOSIS — T82510A Breakdown (mechanical) of surgically created arteriovenous fistula, initial encounter: Secondary | ICD-10-CM | POA: Insufficient documentation

## 2020-07-24 DIAGNOSIS — Z8616 Personal history of COVID-19: Secondary | ICD-10-CM | POA: Diagnosis not present

## 2020-07-24 DIAGNOSIS — M79604 Pain in right leg: Secondary | ICD-10-CM | POA: Diagnosis not present

## 2020-07-24 DIAGNOSIS — Z79899 Other long term (current) drug therapy: Secondary | ICD-10-CM | POA: Insufficient documentation

## 2020-07-24 DIAGNOSIS — Z992 Dependence on renal dialysis: Secondary | ICD-10-CM

## 2020-07-24 DIAGNOSIS — Z7982 Long term (current) use of aspirin: Secondary | ICD-10-CM | POA: Diagnosis not present

## 2020-07-24 HISTORY — DX: Anemia, unspecified: D64.9

## 2020-07-24 HISTORY — PX: LIGATION OF ARTERIOVENOUS  FISTULA: SHX5948

## 2020-07-24 HISTORY — DX: COVID-19: U07.1

## 2020-07-24 HISTORY — DX: Pneumonia, unspecified organism: J18.9

## 2020-07-24 HISTORY — DX: Chronic kidney disease, unspecified: N18.9

## 2020-07-24 HISTORY — DX: Peripheral vascular disease, unspecified: I73.9

## 2020-07-24 LAB — POCT I-STAT, CHEM 8
BUN: 57 mg/dL — ABNORMAL HIGH (ref 8–23)
Calcium, Ion: 1.04 mmol/L — ABNORMAL LOW (ref 1.15–1.40)
Chloride: 108 mmol/L (ref 98–111)
Creatinine, Ser: 11.5 mg/dL — ABNORMAL HIGH (ref 0.61–1.24)
Glucose, Bld: 87 mg/dL (ref 70–99)
HCT: 18 % — ABNORMAL LOW (ref 39.0–52.0)
Hemoglobin: 6.1 g/dL — CL (ref 13.0–17.0)
Potassium: 4.5 mmol/L (ref 3.5–5.1)
Sodium: 138 mmol/L (ref 135–145)
TCO2: 19 mmol/L — ABNORMAL LOW (ref 22–32)

## 2020-07-24 LAB — PREPARE RBC (CROSSMATCH)

## 2020-07-24 LAB — CBC
HCT: 21.4 % — ABNORMAL LOW (ref 39.0–52.0)
Hemoglobin: 6.4 g/dL — CL (ref 13.0–17.0)
MCH: 30.5 pg (ref 26.0–34.0)
MCHC: 29.9 g/dL — ABNORMAL LOW (ref 30.0–36.0)
MCV: 101.9 fL — ABNORMAL HIGH (ref 80.0–100.0)
Platelets: 308 10*3/uL (ref 150–400)
RBC: 2.1 MIL/uL — ABNORMAL LOW (ref 4.22–5.81)
RDW: 16.3 % — ABNORMAL HIGH (ref 11.5–15.5)
WBC: 13.1 10*3/uL — ABNORMAL HIGH (ref 4.0–10.5)
nRBC: 0 % (ref 0.0–0.2)

## 2020-07-24 SURGERY — LIGATION OF ARTERIOVENOUS  FISTULA
Anesthesia: General | Site: Arm Upper | Laterality: Left

## 2020-07-24 MED ORDER — CEFAZOLIN SODIUM-DEXTROSE 2-4 GM/100ML-% IV SOLN
2.0000 g | INTRAVENOUS | Status: AC
  Administered 2020-07-24: 2 g via INTRAVENOUS
  Filled 2020-07-24: qty 100

## 2020-07-24 MED ORDER — PROPOFOL 10 MG/ML IV BOLUS
INTRAVENOUS | Status: AC
  Filled 2020-07-24: qty 20

## 2020-07-24 MED ORDER — PHENYLEPHRINE 40 MCG/ML (10ML) SYRINGE FOR IV PUSH (FOR BLOOD PRESSURE SUPPORT)
PREFILLED_SYRINGE | INTRAVENOUS | Status: DC | PRN
  Administered 2020-07-24: 120 ug via INTRAVENOUS

## 2020-07-24 MED ORDER — DEXAMETHASONE SODIUM PHOSPHATE 10 MG/ML IJ SOLN
INTRAMUSCULAR | Status: DC | PRN
  Administered 2020-07-24: 5 mg via INTRAVENOUS

## 2020-07-24 MED ORDER — OXYCODONE-ACETAMINOPHEN 5-325 MG PO TABS: 1.0000 | ORAL_TABLET | ORAL | 0 refills | Status: DC | PRN

## 2020-07-24 MED ORDER — MIDAZOLAM HCL 2 MG/2ML IJ SOLN
INTRAMUSCULAR | Status: AC
  Filled 2020-07-24: qty 2

## 2020-07-24 MED ORDER — LIDOCAINE HCL 1 % IJ SOLN
INTRAMUSCULAR | Status: DC | PRN
  Administered 2020-07-24: 7 mL via INTRADERMAL

## 2020-07-24 MED ORDER — FENTANYL CITRATE (PF) 100 MCG/2ML IJ SOLN: 25.0000 ug | INTRAMUSCULAR | Status: DC | PRN

## 2020-07-24 MED ORDER — ONDANSETRON HCL 4 MG/2ML IJ SOLN
INTRAMUSCULAR | Status: DC | PRN
  Administered 2020-07-24: 4 mg via INTRAVENOUS

## 2020-07-24 MED ORDER — LIDOCAINE 2% (20 MG/ML) 5 ML SYRINGE
INTRAMUSCULAR | Status: DC | PRN
  Administered 2020-07-24: 60 mg via INTRAVENOUS

## 2020-07-24 MED ORDER — FENTANYL CITRATE (PF) 250 MCG/5ML IJ SOLN
INTRAMUSCULAR | Status: AC
  Filled 2020-07-24: qty 5

## 2020-07-24 MED ORDER — SODIUM CHLORIDE 0.9% IV SOLUTION: Freq: Once | INTRAVENOUS | Status: DC

## 2020-07-24 MED ORDER — SODIUM CHLORIDE 0.9 % IV SOLN: INTRAVENOUS | Status: DC

## 2020-07-24 MED ORDER — 0.9 % SODIUM CHLORIDE (POUR BTL) OPTIME
TOPICAL | Status: DC | PRN
  Administered 2020-07-24: 1000 mL

## 2020-07-24 MED ORDER — ORAL CARE MOUTH RINSE: 15.0000 mL | Freq: Once | OROMUCOSAL | Status: AC

## 2020-07-24 MED ORDER — CHLORHEXIDINE GLUCONATE 4 % EX LIQD: 60.0000 mL | Freq: Once | CUTANEOUS | Status: DC

## 2020-07-24 MED ORDER — CHLORHEXIDINE GLUCONATE 0.12 % MT SOLN
15.0000 mL | Freq: Once | OROMUCOSAL | Status: AC
  Administered 2020-07-24: 15 mL via OROMUCOSAL
  Filled 2020-07-24: qty 15

## 2020-07-24 MED ORDER — ACETAMINOPHEN 500 MG PO TABS
1000.0000 mg | ORAL_TABLET | Freq: Once | ORAL | Status: AC
  Administered 2020-07-24: 1000 mg via ORAL
  Filled 2020-07-24: qty 2

## 2020-07-24 MED ORDER — FENTANYL CITRATE (PF) 250 MCG/5ML IJ SOLN
INTRAMUSCULAR | Status: DC | PRN
  Administered 2020-07-24: 50 ug via INTRAVENOUS

## 2020-07-24 MED ORDER — PROPOFOL 10 MG/ML IV BOLUS
INTRAVENOUS | Status: DC | PRN
  Administered 2020-07-24: 80 mg via INTRAVENOUS
  Administered 2020-07-24: 30 mg via INTRAVENOUS

## 2020-07-24 MED ORDER — ONDANSETRON HCL 4 MG/2ML IJ SOLN: 4.0000 mg | Freq: Once | INTRAMUSCULAR | Status: DC | PRN

## 2020-07-24 SURGICAL SUPPLY — 33 items
ADH SKN CLS APL DERMABOND .7 (GAUZE/BANDAGES/DRESSINGS) ×1
AGENT HMST SPONGE THK3/8 (HEMOSTASIS)
ARMBAND PINK RESTRICT EXTREMIT (MISCELLANEOUS) ×3 IMPLANT
CANISTER SUCT 3000ML PPV (MISCELLANEOUS) ×3 IMPLANT
COVER PROBE W GEL 5X96 (DRAPES) ×3 IMPLANT
COVER WAND RF STERILE (DRAPES) ×3 IMPLANT
DERMABOND ADVANCED (GAUZE/BANDAGES/DRESSINGS) ×2
DERMABOND ADVANCED .7 DNX12 (GAUZE/BANDAGES/DRESSINGS) ×1 IMPLANT
ELECT REM PT RETURN 9FT ADLT (ELECTROSURGICAL) ×3
ELECTRODE REM PT RTRN 9FT ADLT (ELECTROSURGICAL) ×1 IMPLANT
GAUZE SPONGE 4X4 12PLY STRL (GAUZE/BANDAGES/DRESSINGS) ×3 IMPLANT
GLOVE BIO SURGEON STRL SZ7.5 (GLOVE) ×3 IMPLANT
GLOVE SRG 8 PF TXTR STRL LF DI (GLOVE) ×1 IMPLANT
GLOVE SURG UNDER POLY LF SZ8 (GLOVE) ×3
GOWN STRL REUS W/ TWL LRG LVL3 (GOWN DISPOSABLE) ×2 IMPLANT
GOWN STRL REUS W/ TWL XL LVL3 (GOWN DISPOSABLE) ×2 IMPLANT
GOWN STRL REUS W/TWL LRG LVL3 (GOWN DISPOSABLE) ×6
GOWN STRL REUS W/TWL XL LVL3 (GOWN DISPOSABLE) ×6
HEMOSTAT SPONGE AVITENE ULTRA (HEMOSTASIS) IMPLANT
KIT BASIN OR (CUSTOM PROCEDURE TRAY) ×3 IMPLANT
KIT TURNOVER KIT B (KITS) ×3 IMPLANT
NS IRRIG 1000ML POUR BTL (IV SOLUTION) ×3 IMPLANT
PACK CV ACCESS (CUSTOM PROCEDURE TRAY) ×3 IMPLANT
PAD ARMBOARD 7.5X6 YLW CONV (MISCELLANEOUS) ×6 IMPLANT
SUT ETHILON 3 0 PS 1 (SUTURE) IMPLANT
SUT MNCRL AB 4-0 PS2 18 (SUTURE) ×6 IMPLANT
SUT PROLENE 6 0 BV (SUTURE) IMPLANT
SUT SILK 0 TIES 10X30 (SUTURE) ×3 IMPLANT
SUT VIC AB 3-0 SH 27 (SUTURE) ×3
SUT VIC AB 3-0 SH 27X BRD (SUTURE) ×1 IMPLANT
TOWEL GREEN STERILE (TOWEL DISPOSABLE) ×3 IMPLANT
UNDERPAD 30X36 HEAVY ABSORB (UNDERPADS AND DIAPERS) ×3 IMPLANT
WATER STERILE IRR 1000ML POUR (IV SOLUTION) ×3 IMPLANT

## 2020-07-24 NOTE — Progress Notes (Signed)
Patient taken to OR with CRNA, Almyra Free as soon as blood was started.  Handoff given to [place].

## 2020-07-24 NOTE — Progress Notes (Addendum)
Called Dr. Gifford Shave regarding patient's B/P and an abnormal lab value, awaiting call back.   Received call back from Dr. Gifford Shave, orders received.  Will continue to monitor patient.

## 2020-07-24 NOTE — Op Note (Signed)
Date: 07/24/2020  Preoperative diagnosis: Left upper extremity steal syndrome  Postoperative diagnosis: Same  Procedure: Ligation of left arm brachiocephalic AV fistula  Surgeon: Dr. [name], MD  Assistant: OR staff  Indication: Patient is a 66 year old male who was seen in the PA clinic yesterday with steal syndrome in the left upper extremity with sensory and motor deficit in the hand with reverse flow in the radial artery. He presents today for fistula ligation after risk benefits discussed  Findings: Left brachiocephalic fistula ligated at the antecubitum with excellent Doppler flow in the radial artery at the wrist upon completion.  Anesthesia: LMA  Details: Patient was taken to the operating room after informed consent was obtained. Placed on the operative table in supine position. After anesthesia was induced the left arm was prepped draped usual sterile fashion. I then made a transverse incision at the antecubitum after injecting 7 mL of lidocaine. Dissected out with Bovie cautery and circumferentially mobilized the cephalic vein near the artery anastomosis. I then used fistula clamps and the cephalic vein was transected. I then oversewed the arterial side of the vein with a 6-0 Prolene baseball stitch oversewn with a running stitch. The venous side was then ligated with 2-0 silk ties x2. I checked the wrist and there was excellent Doppler flow with a at least biphasic signal in the radial artery. The incision was irrigated out closed with 3-0 Vicryl 4-0 Monocryl Dermabond.  Complication: None  Condition: Stable  [name], MD [facility] [place] Office: [place]

## 2020-07-24 NOTE — Transfer of Care (Signed)
Immediate Anesthesia Transfer of Care Note  Patient: Albert Harris  Procedure(s) Performed: LIGATION OF LEFT BRACHIOCEPHALIC ARTERIOVENOUS  FISTULA (Left Arm Upper)  Patient Location: PACU  Anesthesia Type:General  Level of Consciousness: awake, alert  and oriented  Airway & Oxygen Therapy: Patient Spontanous Breathing  Post-op Assessment: Report given to RN  Post vital signs: Reviewed and stable  Last Vitals:  Vitals Value Taken Time  BP    Temp    Pulse 82 07/24/20 1228  Resp 21 07/24/20 1228  SpO2 98 % 07/24/20 1228  Vitals shown include unvalidated device data.  Last Pain:  Vitals:   07/24/20 1115  TempSrc: Oral  PainSc:       Patients Stated Pain Goal: 4 ([id number])  Complications: No complications documented.

## 2020-07-24 NOTE — Anesthesia Procedure Notes (Signed)
Procedure Name: LMA Insertion Date/Time: 07/24/2020 11:37 AM Performed by: Barrington Ellison, CRNA Pre-anesthesia Checklist: Patient identified, Emergency Drugs available, Suction available and Patient being monitored Patient Re-evaluated:Patient Re-evaluated prior to induction Oxygen Delivery Method: Circle System Utilized Preoxygenation: Pre-oxygenation with 100% oxygen Induction Type: IV induction Ventilation: Mask ventilation without difficulty LMA: LMA inserted LMA Size: 4.0 Number of attempts: 1 Placement Confirmation: positive ETCO2 Tube secured with: Tape Dental Injury: Teeth and Oropharynx as per pre-operative assessment

## 2020-07-24 NOTE — Progress Notes (Signed)
CRITICAL VALUE ALERT  Critical Value:  6.4 Hgb  Date & Time Notied:  07/24/20 1028  Provider Notified: Dr. Gifford Shave [id number]  Orders Received/Actions taken: Yes

## 2020-07-24 NOTE — Discharge Instructions (Signed)
Left arm AV fistula ligated today.  Would anticipate numbness and tingling and weakness improving.  Please contact our office if worse. I have added vein mapping to evaluate for new AV fistula in the right arm when he has follow-up with Dr. Scot Dock.

## 2020-07-24 NOTE — H&P (Signed)
History and Physical Interval Note:  07/24/2020 10:57 AM  Albert Harris  has presented today for surgery, with the diagnosis of COMPLICATION OF AVF.  The various methods of treatment have been discussed with the patient and family. After consideration of risks, benefits and other options for treatment, the patient has consented to  Procedure(s): LIGATION OF LEFT BRACHIOCEPHALIC ARTERIOVENOUS  FISTULA (Left) as a surgical intervention.  [name] history has been reviewed, patient examined, no change in status, stable for surgery.  I have reviewed the patient's chart and labs.  Questions were answered to the patient's satisfaction.    Ligate left arm brachiocephalic AVF  Albert Harris  Established Dialysis Access   History of Present Illness   Albert Harris is a 65 y.o. (1955/12/09) male who presents for evaluation of left brachiocephalic AV fistula that was created by Albert Harris in October of 2021. He is now having significant pain, coldness and swelling that started in January. He said was suppose to be seen in January for this but then ended up getting COVID and was in ICU. Following his discharge it has continued to get worse. He says now it is very unbearable. He cannot sleep secondary to the pain. The pain is constant sharp shooting pain especially into the left thumb and middle fingers. His grip strength is weakened and hand is especially painful to even touch. He says the hand feels like he has it submerged in an ice bucket. Apparently during his hospitalization they attempted to cannulate the fistula several times without success. Currently using a right IJ [facility] for dialysis on TTS at the [place] location  His wife and he also report significant shooting pain into right 1st toe and black spot on the right 1st toe which has been present since he was hospitalized. He had undergone Aortogram with bilateral runoff, angioplasty and stenting of the right external iliac and common  iliac arteries as well as angioplasty and stenting of the left common iliac artery on 06/22/19 by Albert Harris secondary to rest pain and no identifiable flow in the RLE by doppler. He says he does not really recall what happened during his hospitalization because of how sick he was but he knows that since then he has had continuous pain in the right foot. No increased pain on ambulation. Wife has been applying peroxide to the right 1st toe.         Current Outpatient Medications  Medication Sig Dispense Refill  . acetaminophen (TYLENOL) 650 MG CR tablet Take 1,300 mg by mouth every 8 (eight) hours as needed for pain or fever.    Marland Kitchen albuterol (VENTOLIN HFA) 108 (90 Base) MCG/ACT inhaler Inhale 1-2 puffs into the lungs every 6 (six) hours as needed for wheezing or shortness of breath. 8 g 0  . amLODipine (NORVASC) 10 MG tablet Take 1 tablet (10 mg total) by mouth daily. 30 tablet 3  . aspirin EC 81 MG EC tablet Take 1 tablet (81 mg total) by mouth daily. Swallow whole. 30 tablet 11  . atorvastatin (LIPITOR) 40 MG tablet Take 1 tablet (40 mg total) by mouth daily at 6 PM. 30 tablet 0  . calcium acetate (PHOSLO) 667 MG capsule Take 2 capsules (1,334 mg total) by mouth 3 (three) times daily with meals. 30 capsule 0  . metoprolol tartrate (LOPRESSOR) 25 MG tablet Take 25 mg by mouth 2 (two) times daily.    . nitroGLYCERIN (NITROSTAT) 0.4 MG SL tablet Place 1 tablet (  0.4 mg total) under the tongue every 5 (five) minutes x 3 doses as needed for chest pain. (Patient taking differently: Place 0.4 mg under the tongue every 5 (five) minutes as needed for chest pain.) 30 tablet 0  . sodium bicarbonate 650 MG tablet Take 650 mg by mouth daily as needed for heartburn.     No current facility-administered medications for this visit.    On ROS today: negative unless stated in HPI   Physical Examination      Vitals:   07/23/20 0830  BP: (!) 199/102  Pulse: 92  Resp: 20  Temp: 98.3 F (36.8 C)   TempSrc: Temporal  SpO2: 100%  Weight: 144 lb 9.6 oz (65.6 kg)  Height: '5\' 8"'$  (1.727 m)   Body mass index is 21.99 kg/m.  General WDWN, in discomfort but not in acute distress  Pulmonary Non labored  Cardiac Regular rate and rhythm, no murmur  Vascular Vessel Right Left  Radial Palpable Palpable  Brachial Palpable Palpable  Ulnar Not palpable Not palpable  Doppler left radial faint monophasic, ulnar monophasic as well as palmar arteries. Right had with brisk radial, ulnar and palmar signals. Left had very tender to palpation. Left hand is warm. 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 Motor and sensation intact. Right foot warm. Doppler PT and peroneal signals.   M/S 5/5 throughout  , Extremities without ischemic changes    Neurologic A&O; CN grossly intact     Non-invasive Vascular Imaging   left Arm Access Duplex  (07/23/20):   Diameters:  0.47-0.58 mm  Depth:  0.33- 0.32m  PSV:  371 c/s  Summary:  Patent brachiocephalic AVF. Multiple small branches in the proximal and mid upper arm.  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 The radial artery is retrograde. The distal radial artery demonstrates a "thumpy" waveform and is small and atherosclerotic.   Medical Decision Making   Albert Harris a 65y.o. male who presents due to pain, swelling and coldness in left upper extremity. He has left Brachiocephalic AV fistula placed in October by Dr. CCarlis Harris This became painful after [place] He now has unbearable steal symptoms. I dont think his fistula would be amenable to banding to salvage it. I discussed with patient and his wife that best option is probably to have this fistula ligated to resolve his symptoms. He will subsequently need a new upper extremity vein mapping. Prior vein mapping showed adequate right upper extremity vein conduits which will probably be his  best option. [facility]. He additionally is having some RLE rest pain with dry gangrene. He has follow up arranged with Dr. DScot Dockon 08/15/19 for follow up following his angiogram with angioplasty and stenting of bilateral iliac arteries. Instructed patients wife to pain toes with betadine. Keep toes protected Pain medication percocet 5-325 mg #8 no refills was sent to patients pharmacy His left upper extremity AV fistula ligation has been scheduled for 07/24/20 with Dr. CTracey Harries PA-C [facility] [place] [place]: [phone] Clinic MD: Dr. CCarlis Harris

## 2020-07-24 NOTE — Anesthesia Preprocedure Evaluation (Signed)
Anesthesia Evaluation  Patient identified by MRN, date of birth, ID band Patient awake    Reviewed: Allergy & Precautions, NPO status , Patient's Chart, lab work & pertinent test results, reviewed documented beta blocker date and time   Airway Mallampati: II  TM Distance: >3 FB Neck ROM: Full    Dental  (+) Dental Advisory Given, Poor Dentition, Missing   Pulmonary asthma , COPD,  COPD inhaler, former smoker,  COVID 06/16/20   Pulmonary exam normal breath sounds clear to auscultation       Cardiovascular hypertension, Pt. on home beta blockers and Pt. 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  Reproductive/Obstetrics                             Anesthesia Physical Anesthesia Plan  ASA: III  Anesthesia Plan: General   Post-op Pain Management:    Induction: Intravenous  PONV Risk Score and Plan: 2 and Dexamethasone and Ondansetron  Airway Management Planned: LMA  Additional Equipment:   Intra-op Plan:   Post-operative Plan: Extubation in OR  Informed Consent: I have reviewed the patients History and Physical, chart, labs and discussed the procedure including the risks, benefits and alternatives for the proposed anesthesia with the patient or authorized representative who has indicated his/her understanding and acceptance.     Dental advisory given  Plan Discussed with: CRNA  Anesthesia Plan Comments:         Anesthesia Quick Evaluation

## 2020-07-25 ENCOUNTER — Other Ambulatory Visit: Payer: Self-pay | Admitting: Nephrology

## 2020-07-25 ENCOUNTER — Encounter (HOSPITAL_COMMUNITY): Payer: Self-pay | Admitting: Vascular Surgery

## 2020-07-25 LAB — TYPE AND SCREEN
ABO/RH(D): O POS
Antibody Screen: NEGATIVE
Unit division: 0

## 2020-07-25 LAB — BPAM RBC
Blood Product Expiration Date: 202203192359
ISSUE DATE / TIME: 202202161120
Unit Type and Rh: 5100

## 2020-07-25 MED FILL — METOPROLOL TARTRATE 25 MG T: 25 | 30 days supply | Qty: 60 | Fill #0

## 2020-07-25 MED FILL — AMLODIPINE BESYLATE 10 MG T: 10 | 30 days supply | Qty: 30 | Fill #0

## 2020-07-25 NOTE — Anesthesia Postprocedure Evaluation (Signed)
Anesthesia Post Note  Patient: Albert Harris  Procedure(s) Performed: LIGATION OF LEFT BRACHIOCEPHALIC ARTERIOVENOUS  FISTULA (Left Arm Upper)     Patient location during evaluation: PACU Anesthesia Type: General Level of consciousness: awake and alert Pain management: pain level controlled Vital Signs Assessment: post-procedure vital signs reviewed and stable Respiratory status: spontaneous breathing, nonlabored ventilation, respiratory function stable and patient connected to nasal cannula oxygen Cardiovascular status: blood pressure returned to baseline and stable Postop Assessment: no apparent nausea or vomiting Anesthetic complications: no   No complications documented.  Last Vitals:  Vitals:   07/24/20 1228 07/24/20 1243  BP: (!) 156/92 (!) 174/98  Pulse: 86 78  Resp: 19 12  Temp: 36.6 C 36.7 C  SpO2: 97% 97%    Last Pain:  Vitals:   07/24/20 1228  TempSrc:   PainSc: 0-No pain                 Catalina Gravel

## 2020-07-26 ENCOUNTER — Other Ambulatory Visit: Payer: Self-pay

## 2020-07-26 ENCOUNTER — Ambulatory Visit (INDEPENDENT_AMBULATORY_CARE_PROVIDER_SITE_OTHER): Payer: Medicare Other | Admitting: Primary Care

## 2020-07-26 VITALS — BP 182/87 | HR 86 | Temp 98.0°F | Wt 144.4 lb

## 2020-07-26 DIAGNOSIS — H6122 Impacted cerumen, left ear: Secondary | ICD-10-CM | POA: Diagnosis not present

## 2020-07-26 DIAGNOSIS — F32A Depression, unspecified: Secondary | ICD-10-CM

## 2020-07-26 NOTE — Progress Notes (Signed)
[facility]    [id number]  Arrival Time:   CC:EAR PAIN   Albert Harris is a 64 y.o. male who presents for Left ear cerumen . Denies a precipitating event, such as swimming or wearing ear plugs.  Patient states the pain is constant and achy in character.    ROS: As per HPI.  All other pertinent ROS negative.     Past Medical History:  Diagnosis Date  . Anemia   . Asthma   . Carotid stenosis 09/12/2019  . Chronic kidney disease   . Claudication in peripheral vascular disease ([place]) 09/12/2019  . Complication of anesthesia    " i HAVE A HARD TIME WAKING UP " (only one time)  . COPD (chronic obstructive pulmonary disease) ([place])   . COVID-19    positive on  06/16/20  . GERD (gastroesophageal reflux disease)   . HLD (hyperlipidemia)   . Hypertension   . Peripheral vascular disease ([place])   . PFO with atrial septal aneurysm    by echo 11/2017  . Pneumonia   . Stroke ([place])   . [name] (transient ischemic attack) 11/2017  . Tobacco abuse    quit 07/17/19   Past Surgical History:  Procedure Laterality Date  . ABDOMINAL AORTOGRAM W/LOWER EXTREMITY N/A 06/21/2020   Procedure: ABDOMINAL AORTOGRAM W/LOWER EXTREMITY;  Surgeon: Angelia Mould, MD;  Location: [facility];  Service: Cardiovascular;  Laterality: N/A;  . AV FISTULA PLACEMENT Left 03/28/2020   Procedure: ARTERIOVENOUS (AV) FISTULA CREATION LEFT;  Surgeon: [name], MD;  Location: [facility];  Service: Vascular;  Laterality: Left;  . COLONOSCOPY    . COLONOSCOPY WITH PROPOFOL N/A 06/27/2020   Procedure: COLONOSCOPY WITH PROPOFOL;  Surgeon: Carol Ada, MD;  Location: [facility];  Service: Endoscopy;  Laterality: N/A;  . FINGER SURGERY    . HEMOSTASIS CLIP PLACEMENT  06/27/2020   Procedure: HEMOSTASIS CLIP PLACEMENT;  Surgeon: Carol Ada, MD;  Location: [facility];  Service: Endoscopy;;  . INSERTION OF DIALYSIS CATHETER Right 06/24/2020   Procedure: INSERTION OF TUNNELED  DIALYSIS CATHETER; Removal of  temporary dialysis catheter right neck;  Surgeon: Angelia Mould, MD;  Location: [facility];  Service: Vascular;  Laterality: Right;  . LIGATION OF ARTERIOVENOUS  FISTULA Left 07/24/2020   Procedure: LIGATION OF LEFT BRACHIOCEPHALIC ARTERIOVENOUS  FISTULA;  Surgeon: [name], MD;  Location: [facility];  Service: Vascular;  Laterality: Left;  . PERIPHERAL VASCULAR INTERVENTION Right 06/21/2020   Procedure: PERIPHERAL VASCULAR INTERVENTION;  Surgeon: Angelia Mould, MD;  Location: [facility];  Service: Cardiovascular;  Laterality: Right;   No Known Allergies Current Outpatient Medications on File Prior to Visit  Medication Sig Dispense Refill  . acetaminophen (TYLENOL) 650 MG CR tablet Take 1,300 mg by mouth every 8 (eight) hours as needed for pain or fever.    Marland Kitchen albuterol (VENTOLIN HFA) 108 (90 Base) MCG/ACT inhaler Inhale 1-2 puffs into the lungs every 6 (six) hours as needed for wheezing or shortness of breath. 8 g 0  . amLODipine (NORVASC) 10 MG tablet Take 1 tablet (10 mg total) by mouth daily. 30 tablet 3  . aspirin EC 81 MG EC tablet Take 1 tablet (81 mg total) by mouth daily. Swallow whole. 30 tablet 11  . atorvastatin (LIPITOR) 40 MG tablet Take 1 tablet (40 mg total) by mouth daily at 6 PM. 30 tablet 0  . calcium acetate (PHOSLO) 667 MG capsule Take 2 capsules (1,334 mg total) by mouth  3 (three) times daily with meals. (Patient taking differently: Take 667-1,334 mg by mouth 3 (three) times daily with meals.) 30 capsule 0  . metoprolol tartrate (LOPRESSOR) 25 MG tablet Take 25 mg by mouth See admin instructions. Take 25 mg by mouth twice daily on Monday, Wednesday, Friday and Sunday    . nitroGLYCERIN (NITROSTAT) 0.4 MG SL tablet Place 1 tablet (0.4 mg total) under the tongue every 5 (five) minutes x 3 doses as needed for chest pain. (Patient taking differently: Place 0.4 mg under the tongue every 5 (five) minutes as needed for chest pain.) 30 tablet 0  .  oxyCODONE-acetaminophen (PERCOCET) 5-325 MG tablet Take 1 tablet by mouth every 4 (four) hours as needed for severe pain. 8 tablet 0  . oxyCODONE-acetaminophen (PERCOCET) 5-325 MG tablet Take 1 tablet by mouth every 4 (four) hours as needed for severe pain. 10 tablet 0  . sodium bicarbonate 650 MG tablet Take 650 mg by mouth daily as needed for heartburn.     No current facility-administered medications on file prior to visit.   Social History   Socioeconomic History  . Marital status: Divorced    Spouse name: Not on file  . Number of children: Not on file  . Years of education: Not on file  . Highest education level: Not on file  Occupational History  . Not on file  Tobacco Use  . Smoking status: Former Smoker    Packs/day: 1.00    Years: 50.00    Pack years: 50.00    Types: Cigarettes    Quit date: 07/17/2019    Years since quitting: 1.0  . Smokeless tobacco: Never Used  Vaping Use  . Vaping Use: Never used  Substance and Sexual Activity  . Alcohol use: Not Currently    Alcohol/week: 1.0 - 3.0 standard drink    Types: 1 - 3 Cans of beer per week  . Drug use: No  . Sexual activity: Not Currently  Other Topics Concern  . Not on file  Social History Narrative   Married   Works in Audiological scientist   Social Determinants of Health   Financial Resource Strain: Low Risk   . Difficulty of Paying Living Expenses: Not hard at all  Food Insecurity: No Food Insecurity  . Worried About Charity fundraiser in the Last Year: Never true  . Ran Out of Food in the Last Year: Never true  Transportation Needs: No Transportation Needs  . Lack of Transportation (Medical): No  . Lack of Transportation (Non-Medical): No  Physical Activity: Not on file  Stress: No Stress Concern Present  . Feeling of Stress : Not at all  Social Connections: Not on file  Intimate Partner Violence: Not on file   Family History  Problem Relation Age of Onset  . Hypertension Mother   . Colon  cancer Father   . Stomach cancer Brother     OBJECTIVE:  Vitals:   07/26/20 0933  BP: (!) 182/87  Pulse: 86  Temp: 98 F (36.7 C)  TempSrc: Oral  SpO2: 98%  Weight: 144 lb 6.4 oz (65.5 kg)     General appearance: alert; appears fatigued HEENT: Ears:  TMs pearly gray with visible cone of light, without erythema right ear left ear TM not visible ; Eyes: PERRL, EOMI grossly; Sinuses nontender to palpation; Nose: clear rhinorrhea; Throat: oropharynx mildly erythematous, tonsils 1+ without white tonsillar exudates, uvula midline Neck: supple without LAD Lungs: unlabored respirations, symmetrical air entry;  cough: mild; no respiratory distress Heart: regular rate and rhythm.  Radial pulses 2+ symmetrical bilaterally Skin: warm and dry Psychological: alert and cooperative; normal mood and affect  Imaging: No results found.   ASSESSMENT & PLAN: Albert Harris was seen today for cerumen impaction.  Diagnoses and all orders for this visit:  Impacted cerumen of left ear Ear lavage completed   Depression, unspecified depression type Flowsheet Row Office Visit from 09/13/2019 in [facility]  PHQ-9 Total Score 16    Referred to CSW

## 2020-07-30 ENCOUNTER — Other Ambulatory Visit: Payer: Self-pay | Admitting: Physician Assistant

## 2020-07-30 ENCOUNTER — Other Ambulatory Visit: Payer: Self-pay

## 2020-07-30 ENCOUNTER — Telehealth: Payer: Self-pay

## 2020-07-30 DIAGNOSIS — I739 Peripheral vascular disease, unspecified: Secondary | ICD-10-CM

## 2020-07-30 DIAGNOSIS — N186 End stage renal disease: Secondary | ICD-10-CM

## 2020-07-30 MED ORDER — OXYCODONE-ACETAMINOPHEN 5-325 MG PO TABS: 1.0000 | ORAL_TABLET | ORAL | 0 refills | Status: DC | PRN

## 2020-07-30 NOTE — Telephone Encounter (Signed)
Patient's wife called to report patient is out of pain medication and is having some swelling and pain in the left arm s/p ligation - but mostly severe pain in the toe on the right foot. This has not gotten worse - but has not gotten better. Discussed with PA - called in pain medicine and placed on CSD schedule tomorrow.

## 2020-07-31 ENCOUNTER — Ambulatory Visit (INDEPENDENT_AMBULATORY_CARE_PROVIDER_SITE_OTHER): Payer: Medicare Other | Admitting: Vascular Surgery

## 2020-07-31 ENCOUNTER — Encounter: Payer: Self-pay | Admitting: Vascular Surgery

## 2020-07-31 ENCOUNTER — Other Ambulatory Visit: Payer: Self-pay

## 2020-07-31 ENCOUNTER — Ambulatory Visit (INDEPENDENT_AMBULATORY_CARE_PROVIDER_SITE_OTHER)
Admission: RE | Admit: 2020-07-31 | Discharge: 2020-07-31 | Disposition: A | Discharge status: Home / Self Care | Payer: Medicare Other | Source: Ambulatory Visit | Attending: Vascular Surgery | Admitting: Vascular Surgery

## 2020-07-31 ENCOUNTER — Ambulatory Visit (HOSPITAL_COMMUNITY)
Admission: RE | Admit: 2020-07-31 | Discharge: 2020-07-31 | Disposition: A | Discharge status: Home / Self Care | Payer: Medicare Other | Source: Ambulatory Visit | Attending: Vascular Surgery | Admitting: Vascular Surgery

## 2020-07-31 ENCOUNTER — Other Ambulatory Visit: Payer: Self-pay | Admitting: Vascular Surgery

## 2020-07-31 VITALS — BP 162/77 | HR 79 | Temp 98.8°F | Resp 20 | Ht 68.0 in | Wt 144.0 lb

## 2020-07-31 DIAGNOSIS — I739 Peripheral vascular disease, unspecified: Secondary | ICD-10-CM

## 2020-07-31 DIAGNOSIS — Z992 Dependence on renal dialysis: Secondary | ICD-10-CM | POA: Insufficient documentation

## 2020-07-31 DIAGNOSIS — N186 End stage renal disease: Secondary | ICD-10-CM | POA: Insufficient documentation

## 2020-07-31 DIAGNOSIS — N185 Chronic kidney disease, stage 5: Secondary | ICD-10-CM

## 2020-07-31 MED ORDER — GABAPENTIN 300 MG PO CAPS: 300.0000 mg | ORAL_CAPSULE | Freq: Three times a day (TID) | ORAL | 2 refills | Status: DC

## 2020-07-31 MED ORDER — OXYCODONE-ACETAMINOPHEN 5-325 MG PO TABS
1.0000 | ORAL_TABLET | ORAL | 0 refills | Status: DC | PRN
Start: 2020-07-31 — End: 2020-09-18

## 2020-07-31 MED FILL — GABAPENTIN 300 MG CAPSULE: 300 | 30 days supply | Qty: 90 | Fill #0

## 2020-07-31 NOTE — Progress Notes (Signed)
REASON FOR VISIT:   Follow-up of peripheral vascular disease and chronic kidney disease  MEDICAL ISSUES:   PERIPHERAL VASCULAR DISEASE: This patient had critical limb ischemia of the right lower extremity with multilevel arterial occlusive disease.  On 06/21/2020 he had angioplasty and stenting of the right common iliac artery and external iliac artery.  He also had angioplasty and stenting of the left common iliac artery.  He has a superficial femoral artery occlusion on the right with reconstitution of the popliteal artery.  Currently the toes gradually improving and I do not think we need to proceed right now with bypass in the right leg.  Is toe pressure on the right is 66 mmHg and he does actually have reasonable Doppler signals in the posterior tibial anterior tibial and dorsalis pedis positions in the right foot.  However if the toe does not continue to improve he would require a right femoral to below-knee popliteal artery bypass.  I plan on seeing him back in 6 weeks.  At that time we will map his right great saphenous vein.  He knows to call if the toe is not improving.  STAGE V CHRONIC KIDNEY DISEASE: This patient is undergone ligation of his left arm fistula for steal.  He is continuing to have significant symptoms in the left arm.  He has a brisk radial ulnar and palmar arch signal with the Doppler in the left hand is warm and well-perfused.  I think he is having pain from ischemia to the nerves and I think this will gradually improve with time.  I have written a prescription for Neurontin which may help the symptoms.  I have also written him a position for Percocet as he is requiring continued pain medicine especially during dialysis.    HPI:   Albert Harris is a pleasant 65 y.o. male who I saw in consultation in the intensive care unit when he was critically ill with Covid pneumonia and end-stage renal disease.  He had chronic multilevel arterial occlusive disease.  He had no  Doppler flow in the right foot and the rest pain.  On 06/21/2020 he underwent angioplasty and stenting of the right external iliac artery, right common iliac artery, and left common iliac artery.    Past Medical History:  Diagnosis Date  . Anemia   . Asthma   . Carotid stenosis 09/12/2019  . Chronic kidney disease   . Claudication in peripheral vascular disease ([place]) 09/12/2019  . Complication of anesthesia    " i HAVE A HARD TIME WAKING UP " (only one time)  . COPD (chronic obstructive pulmonary disease) ([place])   . COVID-19    positive on  06/16/20  . GERD (gastroesophageal reflux disease)   . HLD (hyperlipidemia)   . Hypertension   . Peripheral vascular disease ([place])   . PFO with atrial septal aneurysm    by echo 11/2017  . Pneumonia   . Stroke ([place])   . [name] (transient ischemic attack) 11/2017  . Tobacco abuse    quit 07/17/19    Family History  Problem Relation Age of Onset  . Hypertension Mother   . Colon cancer Father   . Stomach cancer Brother     SOCIAL HISTORY: Social History   Tobacco Use  . Smoking status: Former Smoker    Packs/day: 1.00    Years: 50.00    Pack years: 50.00    Types: Cigarettes    Quit date: 07/17/2019    Years since  quitting: 1.0  . Smokeless tobacco: Never Used  Substance Use Topics  . Alcohol use: Not Currently    Alcohol/week: 1.0 - 3.0 standard drink    Types: 1 - 3 Cans of beer per week    No Known Allergies  Current Outpatient Medications  Medication Sig Dispense Refill  . albuterol (VENTOLIN HFA) 108 (90 Base) MCG/ACT inhaler Inhale 1-2 puffs into the lungs every 6 (six) hours as needed for wheezing or shortness of breath. 8 g 0  . amLODipine (NORVASC) 10 MG tablet Take 1 tablet (10 mg total) by mouth daily. 30 tablet 3  . aspirin EC 81 MG EC tablet Take 1 tablet (81 mg total) by mouth daily. Swallow whole. 30 tablet 11  . atorvastatin (LIPITOR) 40 MG tablet Take 1 tablet (40 mg total) by mouth daily at 6 PM. 30 tablet 0  . calcium  acetate (PHOSLO) 667 MG capsule Take 2 capsules (1,334 mg total) by mouth 3 (three) times daily with meals. (Patient taking differently: Take 667-1,334 mg by mouth 3 (three) times daily with meals.) 30 capsule 0  . gabapentin (NEURONTIN) 300 MG capsule Take 1 capsule (300 mg total) by mouth 3 (three) times daily. 90 capsule 2  . metoprolol tartrate (LOPRESSOR) 25 MG tablet Take 25 mg by mouth See admin instructions. Take 25 mg by mouth twice daily on Monday, Wednesday, Friday and Sunday    . nitroGLYCERIN (NITROSTAT) 0.4 MG SL tablet Place 1 tablet (0.4 mg total) under the tongue every 5 (five) minutes x 3 doses as needed for chest pain. (Patient taking differently: Place 0.4 mg under the tongue every 5 (five) minutes as needed for chest pain.) 30 tablet 0  . oxyCODONE-acetaminophen (PERCOCET) 5-325 MG tablet Take 1 tablet by mouth every 4 (four) hours as needed for severe pain. 6 tablet 0  . oxyCODONE-acetaminophen (PERCOCET/ROXICET) 5-325 MG tablet Take 1 tablet by mouth every 4 (four) hours as needed for severe pain. 30 tablet 0  . sodium bicarbonate 650 MG tablet Take 650 mg by mouth daily as needed for heartburn.     No current facility-administered medications for this visit.    REVIEW OF SYSTEMS:  '[X]'$  denotes positive finding, '[ ]'$  denotes negative finding Cardiac  Comments:  Chest pain or chest pressure:    Shortness of breath upon exertion:    Short of breath when lying flat:    Irregular heart rhythm:        Vascular    Pain in calf, thigh, or hip brought on by ambulation:    Pain in feet at night that wakes you up from your sleep:     Blood clot in your veins:    Leg swelling:         Pulmonary    Oxygen at home:    Productive cough:     Wheezing:         Neurologic    Sudden weakness in arms or legs:     Sudden numbness in arms or legs:     Sudden onset of difficulty speaking or slurred speech:    Temporary loss of vision in one eye:     Problems with dizziness:          Gastrointestinal    Blood in stool:     Vomited blood:         Genitourinary    Burning when urinating:     Blood in urine:        Psychiatric  Major depression:         Hematologic    Bleeding problems:    Problems with blood clotting too easily:        Skin    Rashes or ulcers:        Constitutional    Fever or chills:     PHYSICAL EXAM:   Vitals:   07/31/20 1548  BP: (!) 162/77  Pulse: 79  Resp: 20  Temp: 98.8 F (37.1 C)  SpO2: 98%  Weight: 65.3 kg  Height: '5\' 8"'$  (1.727 m)    GENERAL: The patient is a well-nourished male, in no acute distress. The vital signs are documented above. CARDIAC: There is a regular rate and rhythm.  VASCULAR: I do not detect carotid bruits. He has normal femoral pulses. On the right side he has a brisk posterior tibial, anterior tibial and dorsalis pedis signal with a Doppler. On the left side he has a brisk dorsalis pedis and posterior tibial signal with a Doppler. In the left upper extremity he has a brisk radial ulnar and palmar arch signal with a Doppler.  The left hand is warm and well-perfused.  He is moderately swollen. PULMONARY: There is good air exchange bilaterally without wheezing or rales. ABDOMEN: Soft and non-tender with normal pitched bowel sounds.  MUSCULOSKELETAL: There are no major deformities or cyanosis. NEUROLOGIC: No focal weakness or paresthesias are detected. SKIN: The small wound on the tip of his right great toe is improving.  Is dry without erythema.    PSYCHIATRIC: The patient has a normal affect.  DATA:    ARTERIAL DOPPLER STUDY: I have independently interpreted his arterial Doppler study today.  On the right side there is a monophasic dorsalis pedis and posterior tibial signal.  ABI is 50%.  Toe pressure 66 mmHg.  On the left side there is a monophasic dorsalis pedis and posterior tibial signal.  ABI 67%.  Toe pressure 79 mmHg.  ILIAC DUPLEX: I have independently interpreted his aortoiliac  duplex.  On the right side, the right common iliac artery stent is patent with mildly elevated velocities in the distal stent (153 cm/s).  On the left side there are mildly elevated velocities in the distal stent and distal to the stent (peak systolic velocity [id number] cm/s).  There are bilateral SFA occlusions.  RIGHT UPPER EXTREMITY VEIN MAP: I have independently interpreted his right upper extremity vein map.  The forearm cephalic vein on the right is marginal in size.  The upper arm cephalic vein does not appear adequate.  The basilic vein does not appear adequate.  ECHO: I did review his echo from 07/18/2019.  This showed normal LV function with an ejection fraction of 60 to 65%.  ARTERIOGRAM: I did review his arteriogram from 06/21/2020.  On the right side he would require a right femoral to below-knee popliteal artery bypass.  He has two-vessel runoff on the right via the anterior tibial and posterior tibial arteries which and at the ankle.  [name] [facility] [phone]

## 2020-08-05 ENCOUNTER — Other Ambulatory Visit: Payer: Self-pay | Admitting: *Deleted

## 2020-08-05 DIAGNOSIS — I739 Peripheral vascular disease, unspecified: Secondary | ICD-10-CM

## 2020-08-08 ENCOUNTER — Other Ambulatory Visit: Payer: Self-pay | Admitting: Family Medicine

## 2020-08-08 MED FILL — CALCIUM ACETATE (PHOS BINDE: 667 | 5 days supply | Qty: 30 | Fill #0

## 2020-08-13 ENCOUNTER — Other Ambulatory Visit: Payer: Self-pay | Admitting: Nephrology

## 2020-08-13 MED FILL — CALCIUM ACETATE (PHOS BINDE: 667 | 30 days supply | Qty: 180 | Fill #0

## 2020-08-14 ENCOUNTER — Ambulatory Visit: Payer: Medicare Other | Admitting: Vascular Surgery

## 2020-08-14 ENCOUNTER — Encounter (HOSPITAL_COMMUNITY): Payer: Medicare Other

## 2020-08-19 ENCOUNTER — Telehealth: Payer: Self-pay | Admitting: Licensed Clinical Social Worker

## 2020-08-19 NOTE — Telephone Encounter (Signed)
Call placed to patient regarding [facility] referral. LCSW left message requesting a return call.  

## 2020-08-28 MED FILL — ATORVASTATIN CALCIUM 40 MG: 40 | 30 days supply | Qty: 30 | Fill #0

## 2020-08-28 MED FILL — AMLODIPINE BESYLATE 10 MG T: 10 | 30 days supply | Qty: 30 | Fill #1

## 2020-08-28 MED FILL — FUROSEMIDE 40 MG TAB: 40 | 30 days supply | Qty: 60 | Fill #1

## 2020-08-28 MED FILL — GABAPENTIN 300 MG CAPSULE: 300 | 30 days supply | Qty: 90 | Fill #1

## 2020-08-28 MED FILL — METOPROLOL TARTRATE 25 MG T: 25 | 30 days supply | Qty: 60 | Fill #1

## 2020-09-07 ENCOUNTER — Other Ambulatory Visit: Payer: Self-pay

## 2020-09-18 ENCOUNTER — Ambulatory Visit (INDEPENDENT_AMBULATORY_CARE_PROVIDER_SITE_OTHER): Payer: Medicare Other | Admitting: Vascular Surgery

## 2020-09-18 ENCOUNTER — Other Ambulatory Visit: Payer: Self-pay

## 2020-09-18 ENCOUNTER — Ambulatory Visit (HOSPITAL_COMMUNITY)
Admission: RE | Admit: 2020-09-18 | Discharge: 2020-09-18 | Disposition: A | Discharge status: Home / Self Care | Payer: Medicare Other | Source: Ambulatory Visit | Attending: Vascular Surgery | Admitting: Vascular Surgery

## 2020-09-18 ENCOUNTER — Encounter: Payer: Self-pay | Admitting: Vascular Surgery

## 2020-09-18 VITALS — BP 219/110 | HR 94 | Temp 98.1°F | Resp 20 | Ht 68.0 in | Wt 159.0 lb

## 2020-09-18 DIAGNOSIS — I739 Peripheral vascular disease, unspecified: Secondary | ICD-10-CM

## 2020-09-18 DIAGNOSIS — N186 End stage renal disease: Secondary | ICD-10-CM

## 2020-09-18 DIAGNOSIS — Z48812 Encounter for surgical aftercare following surgery on the circulatory system: Secondary | ICD-10-CM

## 2020-09-18 NOTE — H&P (View-Only) (Signed)
REASON FOR VISIT:   Follow-up of peripheral vascular disease  MEDICAL ISSUES:   PERIPHERAL VASCULAR DISEASE: The patient has an excellent right femoral pulse status post angioplasty and stenting of the right common iliac artery and external iliac artery.  He does have a superficial femoral artery occlusion but the wound on the foot is completely healed so I think we can hold off on right femoropopliteal bypass grafting.  He would require a right femoral to below-knee popliteal artery bypass.  The vein on both sides become small at the level of the knee so he would require either a composite graft with 2 pieces of vein or prosthetic graft.  Typically I am reluctant to tunnel a fem below-knee pop with spliced vein as the vein to vein anastomosis is hidden within the tunnel.  It is difficult to get a lie for anastomosis to the below-knee popliteal artery if the graft is tunneled superficially.  Fortunately the the wound is healed so we can hold off on bypass.  I will see him back in 3 months with follow-up ABIs.  END-STAGE RENAL DISEASE: He needs new access.  His symptoms in the left hand have improved.  Based on his previous vein map it looks like he would require an AV graft on the right side.  He dialyzes on Tuesdays Thursdays and Saturdays.  We will schedule him for placement of a right AV graft on 09/23/2024.  If I see a vein looks reasonable certainly I would consider a fistula.  We have discussed the indications for the procedure and the potential complications of surgery including the risk of steal in the right arm.  HYPERTENSION: His blood pressure was significantly elevated this morning in the office however he did not take his blood pressure medicines this morning.  I have instructed him to be sure to take his blood pressure medicines otherwise his surgery would be canceled if his blood pressure remains this high.  If his blood pressure remains high at home he will follow-up with his primary  care physician  HPI:   Albert Harris is a pleasant 65 y.o. male who I last saw on 07/31/2020.  He had presented with critical limb ischemia and multilevel arterial occlusive disease.  On 06/21/2020 he underwent angioplasty and stenting of the right common iliac artery and external iliac artery he had a superficial femoral artery occlusion on the right with reconstitution of the popliteal artery.  When I saw him last the toes were improving and his toe pressure was 66 mmHg.  He comes in back in for 6-week follow-up visit.  As long as the toes improving I thought we could hold off on bypass.  Otherwise he would require a right femoral to below-knee popliteal artery bypass.  On my history today, patient denies any rest pain.  He has had some right leg swelling but the wound on the right foot has now completely healed.  He quit smoking 2 years ago.  He is on dialysis Tuesdays Thursdays and Saturdays.  He had his left arm fistula ligated for steal.  He has a functioning right IJ tunneled dialysis catheter.  Past Medical History:  Diagnosis Date  . Anemia   . Asthma   . Carotid stenosis 09/12/2019  . Chronic kidney disease   . Claudication in peripheral vascular disease ([place]) 09/12/2019  . Complication of anesthesia    " i HAVE A HARD TIME WAKING UP " (only one time)  . COPD (chronic obstructive pulmonary  disease) ([place])   . COVID-19    positive on  06/16/20  . GERD (gastroesophageal reflux disease)   . HLD (hyperlipidemia)   . Hypertension   . Peripheral vascular disease ([place])   . PFO with atrial septal aneurysm    by echo 11/2017  . Pneumonia   . Stroke ([place])   . [name] (transient ischemic attack) 11/2017  . Tobacco abuse    quit 07/17/19    Family History  Problem Relation Age of Onset  . Hypertension Mother   . Colon cancer Father   . Stomach cancer Brother     SOCIAL HISTORY: Social History   Tobacco Use  . Smoking status: Former Smoker    Packs/day: 1.00    Years: 50.00    Pack  years: 50.00    Types: Cigarettes    Quit date: 07/17/2019    Years since quitting: 1.1  . Smokeless tobacco: Never Used  Substance Use Topics  . Alcohol use: Not Currently    Alcohol/week: 1.0 - 3.0 standard drink    Types: 1 - 3 Cans of beer per week    No Known Allergies  Current Outpatient Medications  Medication Sig Dispense Refill  . albuterol (VENTOLIN HFA) 108 (90 Base) MCG/ACT inhaler INHALE 1-2 PUFFS INTO THE LUNGS EVERY 6 (SIX) HOURS AS NEEDED FOR WHEEZING OR SHORTNESS OF BREATH. 8 g 0  . amLODipine (NORVASC) 10 MG tablet Take 1 tablet (10 mg total) by mouth daily. 30 tablet 3  . aspirin 81 MG EC tablet TAKE 1 TABLET (81 MG TOTAL) BY MOUTH DAILY. SWALLOW WHOLE. (Patient taking differently: Take by mouth 5 (five) times daily. SWALLOW WHOLE.) 30 tablet 11  . atorvastatin (LIPITOR) 40 MG tablet TAKE 1 TABLET (40 MG TOTAL) BY MOUTH DAILY AT 6 PM. (Patient taking differently: Take by mouth daily. at 6pm) 30 tablet 0  . calcium acetate (PHOSLO) 667 MG capsule TAKE 2 CAPSULES BY MOUTH THREE TIMES DAILY WITH MEALS 180 capsule 11  . doxazosin (CARDURA) 4 MG tablet TAKE 1.5 TABLETS (6 MG TOTAL) BY MOUTH DAILY. 135 tablet 1  . furosemide (LASIX) 40 MG tablet TAKE 1 TABLET BY MOUTH TWICE A DAY, AT 8AM AND 2PM 180 tablet 3  . gabapentin (NEURONTIN) 300 MG capsule TAKE 1 CAPSULE (300 MG TOTAL) BY MOUTH 3 (THREE) TIMES DAILY. 90 capsule 2  . metoprolol tartrate (LOPRESSOR) 25 MG tablet Take 25 mg by mouth See admin instructions. Take 25 mg by mouth twice daily on Monday, Wednesday, Friday and Sunday    . metoprolol tartrate (LOPRESSOR) 25 MG tablet TAKE 1 TABLET BY MOUTH TWICE A DAY 60 tablet 6  . nitroGLYCERIN (NITROSTAT) 0.4 MG SL tablet Place 1 tablet (0.4 mg total) under the tongue every 5 (five) minutes x 3 doses as needed for chest pain. (Patient taking differently: Place 0.4 mg under the tongue every 5 (five) minutes as needed for chest pain.) 30 tablet 0  . sodium bicarbonate 650 MG  tablet Take 650 mg by mouth daily as needed for heartburn.     No current facility-administered medications for this visit.    REVIEW OF SYSTEMS:  '[X]'$  denotes positive finding, '[ ]'$  denotes negative finding Cardiac  Comments:  Chest pain or chest pressure:    Shortness of breath upon exertion:    Short of breath when lying flat:    Irregular heart rhythm:        Vascular    Pain in calf, thigh, or hip brought on  by ambulation:    Pain in feet at night that wakes you up from your sleep:     Blood clot in your veins:    Leg swelling:         Pulmonary    Oxygen at home:    Productive cough:     Wheezing:         Neurologic    Sudden weakness in arms or legs:     Sudden numbness in arms or legs:     Sudden onset of difficulty speaking or slurred speech:    Temporary loss of vision in one eye:     Problems with dizziness:         Gastrointestinal    Blood in stool:     Vomited blood:         Genitourinary    Burning when urinating:     Blood in urine:        Psychiatric    Major depression:         Hematologic    Bleeding problems:    Problems with blood clotting too easily:        Skin    Rashes or ulcers:        Constitutional    Fever or chills:     PHYSICAL EXAM:   Vitals:   09/18/20 0949  BP: (!) 219/110  Pulse: 94  Resp: 20  Temp: 98.1 F (36.7 C)  SpO2: 93%  Weight: 159 lb (72.1 kg)  Height: '5\' 8"'$  (1.727 m)    GENERAL: The patient is a well-nourished male, in no acute distress. The vital signs are documented above. CARDIAC: There is a regular rate and rhythm.  VASCULAR: I do not detect carotid bruits. He has palpable femoral pulses.  I cannot palpate pedal pulses. He has moderate right lower extremity swelling. PULMONARY: There is good air exchange bilaterally without wheezing or rales. ABDOMEN: Soft and non-tender with normal pitched bowel sounds.  MUSCULOSKELETAL: There are no major deformities or cyanosis. NEUROLOGIC: No focal weakness or  paresthesias are detected. SKIN: There are no ulcers or rashes noted. PSYCHIATRIC: The patient has a normal affect.  DATA:    SAPHENOUS VEIN MAP: I have independently interpreted the saphenous vein map today.  On the right side diameters of the vein ranged from 0.27 in the mid calf to 0.41 in the proximal thigh.  The vein looks reasonable to the knee.  On the left side the diameters of the vein ranged from 0.22 in the mid calf to 0.41 in the mid thigh.  The vein looks reasonable again to the knee.  [name] [facility] [phone]

## 2020-09-18 NOTE — Progress Notes (Signed)
REASON FOR VISIT:   Follow-up of peripheral vascular disease  MEDICAL ISSUES:   PERIPHERAL VASCULAR DISEASE: The patient has an excellent right femoral pulse status post angioplasty and stenting of the right common iliac artery and external iliac artery.  He does have a superficial femoral artery occlusion but the wound on the foot is completely healed so I think we can hold off on right femoropopliteal bypass grafting.  He would require a right femoral to below-knee popliteal artery bypass.  The vein on both sides become small at the level of the knee so he would require either a composite graft with 2 pieces of vein or prosthetic graft.  Typically I am reluctant to tunnel a fem below-knee pop with spliced vein as the vein to vein anastomosis is hidden within the tunnel.  It is difficult to get a lie for anastomosis to the below-knee popliteal artery if the graft is tunneled superficially.  Fortunately the the wound is healed so we can hold off on bypass.  I will see him back in 3 months with follow-up ABIs.  END-STAGE RENAL DISEASE: He needs new access.  His symptoms in the left hand have improved.  Based on his previous vein map it looks like he would require an AV graft on the right side.  He dialyzes on Tuesdays Thursdays and Saturdays.  We will schedule him for placement of a right AV graft on 09/23/2024.  If I see a vein looks reasonable certainly I would consider a fistula.  We have discussed the indications for the procedure and the potential complications of surgery including the risk of steal in the right arm.  HYPERTENSION: His blood pressure was significantly elevated this morning in the office however he did not take his blood pressure medicines this morning.  I have instructed him to be sure to take his blood pressure medicines otherwise his surgery would be canceled if his blood pressure remains this high.  If his blood pressure remains high at home he will follow-up with his primary  care physician  HPI:   Albert Harris is a pleasant 65 y.o. male who I last saw on 07/31/2020.  He had presented with critical limb ischemia and multilevel arterial occlusive disease.  On 06/21/2020 he underwent angioplasty and stenting of the right common iliac artery and external iliac artery he had a superficial femoral artery occlusion on the right with reconstitution of the popliteal artery.  When I saw him last the toes were improving and his toe pressure was 66 mmHg.  He comes in back in for 6-week follow-up visit.  As long as the toes improving I thought we could hold off on bypass.  Otherwise he would require a right femoral to below-knee popliteal artery bypass.  On my history today, patient denies any rest pain.  He has had some right leg swelling but the wound on the right foot has now completely healed.  He quit smoking 2 years ago.  He is on dialysis Tuesdays Thursdays and Saturdays.  He had his left arm fistula ligated for steal.  He has a functioning right IJ tunneled dialysis catheter.  Past Medical History:  Diagnosis Date  . Anemia   . Asthma   . Carotid stenosis 09/12/2019  . Chronic kidney disease   . Claudication in peripheral vascular disease ([place]) 09/12/2019  . Complication of anesthesia    " i HAVE A HARD TIME WAKING UP " (only one time)  . COPD (chronic obstructive pulmonary  disease) ([place])   . COVID-19    positive on  06/16/20  . GERD (gastroesophageal reflux disease)   . HLD (hyperlipidemia)   . Hypertension   . Peripheral vascular disease ([place])   . PFO with atrial septal aneurysm    by echo 11/2017  . Pneumonia   . Stroke ([place])   . [name] (transient ischemic attack) 11/2017  . Tobacco abuse    quit 07/17/19    Family History  Problem Relation Age of Onset  . Hypertension Mother   . Colon cancer Father   . Stomach cancer Brother     SOCIAL HISTORY: Social History   Tobacco Use  . Smoking status: Former Smoker    Packs/day: 1.00    Years: 50.00    Pack  years: 50.00    Types: Cigarettes    Quit date: 07/17/2019    Years since quitting: 1.1  . Smokeless tobacco: Never Used  Substance Use Topics  . Alcohol use: Not Currently    Alcohol/week: 1.0 - 3.0 standard drink    Types: 1 - 3 Cans of beer per week    No Known Allergies  Current Outpatient Medications  Medication Sig Dispense Refill  . albuterol (VENTOLIN HFA) 108 (90 Base) MCG/ACT inhaler INHALE 1-2 PUFFS INTO THE LUNGS EVERY 6 (SIX) HOURS AS NEEDED FOR WHEEZING OR SHORTNESS OF BREATH. 8 g 0  . amLODipine (NORVASC) 10 MG tablet Take 1 tablet (10 mg total) by mouth daily. 30 tablet 3  . aspirin 81 MG EC tablet TAKE 1 TABLET (81 MG TOTAL) BY MOUTH DAILY. SWALLOW WHOLE. (Patient taking differently: Take by mouth 5 (five) times daily. SWALLOW WHOLE.) 30 tablet 11  . atorvastatin (LIPITOR) 40 MG tablet TAKE 1 TABLET (40 MG TOTAL) BY MOUTH DAILY AT 6 PM. (Patient taking differently: Take by mouth daily. at 6pm) 30 tablet 0  . calcium acetate (PHOSLO) 667 MG capsule TAKE 2 CAPSULES BY MOUTH THREE TIMES DAILY WITH MEALS 180 capsule 11  . doxazosin (CARDURA) 4 MG tablet TAKE 1.5 TABLETS (6 MG TOTAL) BY MOUTH DAILY. 135 tablet 1  . furosemide (LASIX) 40 MG tablet TAKE 1 TABLET BY MOUTH TWICE A DAY, AT 8AM AND 2PM 180 tablet 3  . gabapentin (NEURONTIN) 300 MG capsule TAKE 1 CAPSULE (300 MG TOTAL) BY MOUTH 3 (THREE) TIMES DAILY. 90 capsule 2  . metoprolol tartrate (LOPRESSOR) 25 MG tablet Take 25 mg by mouth See admin instructions. Take 25 mg by mouth twice daily on Monday, Wednesday, Friday and Sunday    . metoprolol tartrate (LOPRESSOR) 25 MG tablet TAKE 1 TABLET BY MOUTH TWICE A DAY 60 tablet 6  . nitroGLYCERIN (NITROSTAT) 0.4 MG SL tablet Place 1 tablet (0.4 mg total) under the tongue every 5 (five) minutes x 3 doses as needed for chest pain. (Patient taking differently: Place 0.4 mg under the tongue every 5 (five) minutes as needed for chest pain.) 30 tablet 0  . sodium bicarbonate 650 MG  tablet Take 650 mg by mouth daily as needed for heartburn.     No current facility-administered medications for this visit.    REVIEW OF SYSTEMS:  '[X]'$  denotes positive finding, '[ ]'$  denotes negative finding Cardiac  Comments:  Chest pain or chest pressure:    Shortness of breath upon exertion:    Short of breath when lying flat:    Irregular heart rhythm:        Vascular    Pain in calf, thigh, or hip brought on  by ambulation:    Pain in feet at night that wakes you up from your sleep:     Blood clot in your veins:    Leg swelling:         Pulmonary    Oxygen at home:    Productive cough:     Wheezing:         Neurologic    Sudden weakness in arms or legs:     Sudden numbness in arms or legs:     Sudden onset of difficulty speaking or slurred speech:    Temporary loss of vision in one eye:     Problems with dizziness:         Gastrointestinal    Blood in stool:     Vomited blood:         Genitourinary    Burning when urinating:     Blood in urine:        Psychiatric    Major depression:         Hematologic    Bleeding problems:    Problems with blood clotting too easily:        Skin    Rashes or ulcers:        Constitutional    Fever or chills:     PHYSICAL EXAM:   Vitals:   09/18/20 0949  BP: (!) 219/110  Pulse: 94  Resp: 20  Temp: 98.1 F (36.7 C)  SpO2: 93%  Weight: 159 lb (72.1 kg)  Height: '5\' 8"'$  (1.727 m)    GENERAL: The patient is a well-nourished male, in no acute distress. The vital signs are documented above. CARDIAC: There is a regular rate and rhythm.  VASCULAR: I do not detect carotid bruits. He has palpable femoral pulses.  I cannot palpate pedal pulses. He has moderate right lower extremity swelling. PULMONARY: There is good air exchange bilaterally without wheezing or rales. ABDOMEN: Soft and non-tender with normal pitched bowel sounds.  MUSCULOSKELETAL: There are no major deformities or cyanosis. NEUROLOGIC: No focal weakness or  paresthesias are detected. SKIN: There are no ulcers or rashes noted. PSYCHIATRIC: The patient has a normal affect.  DATA:    SAPHENOUS VEIN MAP: I have independently interpreted the saphenous vein map today.  On the right side diameters of the vein ranged from 0.27 in the mid calf to 0.41 in the proximal thigh.  The vein looks reasonable to the knee.  On the left side the diameters of the vein ranged from 0.22 in the mid calf to 0.41 in the mid thigh.  The vein looks reasonable again to the knee.  [name] [facility] [phone]

## 2020-09-20 ENCOUNTER — Other Ambulatory Visit: Payer: Self-pay

## 2020-09-20 ENCOUNTER — Other Ambulatory Visit (HOSPITAL_COMMUNITY)
Admission: RE | Admit: 2020-09-20 | Discharge: 2020-09-20 | Disposition: A | Discharge status: Home / Self Care | Payer: Medicare Other | Source: Ambulatory Visit | Attending: Vascular Surgery | Admitting: Vascular Surgery

## 2020-09-20 ENCOUNTER — Encounter (HOSPITAL_COMMUNITY): Payer: Self-pay | Admitting: Vascular Surgery

## 2020-09-20 DIAGNOSIS — Z01812 Encounter for preprocedural laboratory examination: Secondary | ICD-10-CM | POA: Insufficient documentation

## 2020-09-20 DIAGNOSIS — Z20822 Contact with and (suspected) exposure to covid-19: Secondary | ICD-10-CM | POA: Insufficient documentation

## 2020-09-20 LAB — SARS CORONAVIRUS 2 (TAT 6-24 HRS): SARS Coronavirus 2: NEGATIVE

## 2020-09-20 NOTE — Progress Notes (Signed)
PCP - Juluis Mire, NP Cardiologist - Dr Skeet Latch  Chest x-ray - 06/24/20 (1V) EKG - 06/22/20 Stress Test - n/a ECHO - 07/18/19 Cardiac Cath - n/a  Anesthesia review: Yes  STOP now taking any Aspirin (unless otherwise instructed by your surgeon), Aleve, Naproxen, Ibuprofen, Motrin, Advil, Goody's, BC's, all herbal medications, fish oil, and all vitamins.   Coronavirus Screening Covid test is scheduled on Friday, 09/20/20 Do you have any of the following symptoms:  Cough yes/no: No Fever (>100.74F)  yes/no: No Runny nose yes/no: No Sore throat yes/no: No Difficulty breathing/shortness of breath  yes/no: No  Have you traveled in the last 14 days and where? yes/no: No  Patient verbalized understanding of instructions that were given via phone.

## 2020-09-20 NOTE — Anesthesia Preprocedure Evaluation (Deleted)
Anesthesia Evaluation    Airway        Dental   Pulmonary former smoker,           Cardiovascular hypertension,      Neuro/Psych    GI/Hepatic   Endo/Other    Renal/GU      Musculoskeletal   Abdominal   Peds  Hematology   Anesthesia Other Findings   Reproductive/Obstetrics                             Anesthesia Physical Anesthesia Plan  ASA:   Anesthesia Plan:    Post-op Pain Management:    Induction:   PONV Risk Score and Plan:   Airway Management Planned:   Additional Equipment:   Intra-op Plan:   Post-operative Plan:   Informed Consent:   Plan Discussed with:   Anesthesia Plan Comments: (PAT note written 09/20/2020 by Allison Zelenak, PA-C. )        Anesthesia Quick Evaluation  

## 2020-09-20 NOTE — Progress Notes (Signed)
Anesthesia Chart Review: Kathleene Hazel   Case: [id number] Date/Time: 09/23/20 0941   Procedure: RIGHT ARTERIOVENOUS (AV) FISTULA VERSUS ARTERIOVENOUS GRAFT CREATION (Right )   Anesthesia type: Choice   Pre-op diagnosis: ESRD   Location: [facility] OR ROOM 12 / [place] [facility]   Surgeons: Angelia Mould, MD      DISCUSSION: Patient is a 65 year old male scheduled for the above procedure. 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 He needs new permanent HD access.  History includes former smoker (quit 07/17/19), COPD, asthma, HTN (difficult to control), PFO (IAS with PFO and left-to-right shunting noted on 11/10/17 echo; no atrial level shunt detected by color flow Doppler 07/18/19 echo), PVD (with claudication; s/p angioplasty/stent right EIA & bilateral CIA 06/21/20), carotid artery disease (40-59% BICA 09/2019), [name] (possible [name] 12/2016, 11/2017 with no acute infarct on MRI with chronic small vessel ischemia), HLD, ESRD (HD TTS, started 06/2020), anemia. - Admitted 06/16/20-07/02/20 acute hypoxic respiratory failure with COVID-19 PNA, H influenza bacteremia, lower GI bleed, BLE numbness with severe PAD, and progressive CKD. PCCM consulted. COVID treated with remdesivir, Decadron, and supplement oxygen. He did not require intubation. Bacteremia treated with ampicillin followed by ceftriaxone. Vascular surgery consulted and s/p right CIE and bilateral CIA stents 06/21/20. Started on Plavix. GI consulted and s/p colonoscopy 06/27/20 showed a small rectal ulcer s/p clipping and large internal/external hemorrhoids. He received a total of 4 units PRBC to maintain HGB threshold of 8. Plavix held due to GI bleed. Nephrology consulted for CKD5 progressed to ESRD. Previously placed AVF infiltrated and required temporary catheter for CRRT 06/19/20-06/23/20 and then had first hemodialysis 06/26/20.   Last cardiology visit 01/11/20 with Dr. Oval Linsey at the [facility] for  difficult to control hypertension, PAD, and HLD.  Per note patient's blood pressure is pretty well controlled when he is compliant with medication regimen of amlodipine, doxazosin, hydralazine, and Imdur.  However, he has a history of noncompliance.  At that time, PAD (known SFA occlusiosn and CIA stenosis) had not been intervened on due to CKD5. He did not tolerate Pletal. Mild-moderate carotid artery stenosis followed yearly, last 09/2019. She discharged him for the [facility]N Clinic and advised on-going cardiology follow-up with [facility].  In 09/18/20 office note by Dr. Scot Dock, he notes patient's BP was significantly elevated (218/110), however patient had not yet taken his BP medications that morning. He advised Mr. Bosio to take his medications otherwise surgery would likely be canceled. He also advised that if home BP readings remain elevated with medications then he should follow-up with PCP.  Preoperative COVID-19 test is scheduled for 09/20/20. Anesthesia team to evaluate on the day of surgery. He will get vitals and labs on arrival.   VS:  BP Readings from Last 3 Encounters:  09/18/20 (!) 219/110  07/31/20 (!) 162/77  07/26/20 (!) 182/87   Pulse Readings from Last 3 Encounters:  09/18/20 94  07/31/20 79  07/26/20 86    PROVIDERS: Kerin Perna, NP is PCP  - Croitoru, Dani Gobble, MD is primary cardiologist (initially consulted 11/11/17 for finding of PFO on echo. 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  It would be better assessed by TEE, but I think such evaluation can be deferred unless he has further neurological  events after his blood pressure has been well treated and he has stopped smoking." Patient deferred any extensive medical testing at that time until after he secured Medicare. Quay Burow, MD  is [place] cardiologist - Skeet Latch, MD is cardiologist (HTN). Discharged from [facility] at 01/11/20 visit. Elmarie Shiley, MD is nephrologist   LABS: For ISTAT day of surgery. Last lab results include: Lab Results  Component Value Date   WBC 13.1 (H) 07/24/2020   HGB 6.4 (LL) 07/24/2020   HCT 21.4 (L) 07/24/2020   PLT 308 07/24/2020   GLUCOSE 87 07/24/2020   ALT 24 06/22/2020   AST 49 (H) 06/22/2020   NA 138 07/24/2020   K 4.5 07/24/2020   CL 108 07/24/2020   CREATININE 11.50 (H) 07/24/2020   BUN 57 (H) 07/24/2020   CO2 21 (L) 07/01/2020   INR 1.2 06/16/2020     IMAGES: 1V PCXR 06/24/20: IMPRESSION: 1. Interval placement of right jugular approach dialysis catheter with tips overlying the mid SVC. No pneumothorax. 2. Minimally improved aeration of the lung bases, left greater than right, otherwise, similar findings compatible with provided history of COVID 19 pneumonia.   EKG: 06/22/20:  Normal sinus rhythm Left ventricular hypertrophy with repolarization abnormality ( Sokolow-Lyon , Romhilt-Estes ) Abnormal ECG No significant change since last tracing Confirmed by Martin[place], Peter [phone]) on 06/22/2020 1:40:49 PM   CV: Carotid duplex 09/22/2019: Summary:  Right Carotid: Velocities in the right ICA are consistent with a 40-59% stenosis. Non-hemodynamically significant plaque <50% noted in the CCA. There has been an increase of ICA stenosis since Prior exam.  Left Carotid: Velocities in the left ICA are consistent with a 40-59% stenosis. Non-hemodynamically significant plaque <50% noted in the CCA. The ECA appears >50% stenosed. There has been an increase of ICA stenosis since prior exam.  Vertebrals: Left vertebral artery demonstrates antegrade flow. Blunted right vertebral arterial waveform, question of a more proximal stenosis.  Subclavians: Right subclavian artery flow was disturbed. Normal flow hemodynamics were seen in the left subclavian artery.   TTE  07/18/2019: Impressions: 1. Left ventricular ejection fraction, by estimation, is 60 to 65%. The  left ventricle has normal function. The left ventrical has no regional  wall motion abnormalities. There is mildly increased left ventricular  hypertrophy. Left ventricular diastolic  parameters are consistent with Grade II diastolic dysfunction  (pseudonormalization). Elevated left ventricular end-diastolic pressure.  2. Right ventricular systolic function is normal. The right ventricular  size is normal. There is mildly elevated pulmonary artery systolic  pressure. The estimated right ventricular systolic pressure is [id number] mmHg.  3. Left atrial size was mild to moderately dilated.  4. Mild mitral valve regurgitation.  5. The aortic valve is normal in structure and function. Aortic valve  regurgitation is not visualized. No aortic stenosis is present.  6. The inferior vena cava is dilated in size with <50% respiratory  variability, suggesting right atrial pressure of 15 mmHg.    Past Medical History:  Diagnosis Date  . Anemia   . Asthma   . Carotid stenosis 09/12/2019  . Chronic kidney disease   . Claudication in peripheral vascular disease ([place]) 09/12/2019  . Complication of anesthesia    " i HAVE A HARD TIME WAKING UP " (only one time)  . COPD (chronic obstructive pulmonary disease) ([place])   . COVID-19    positive on  06/16/20  . GERD (gastroesophageal reflux disease)   . HLD (hyperlipidemia)   . Hypertension   .  Peripheral vascular disease ([place])   . PFO with atrial septal aneurysm    by echo 11/2017  . Pneumonia   . Stroke ([place])   . [name] (transient ischemic attack) 11/2017  . Tobacco abuse    quit 07/17/19    Past Surgical History:  Procedure Laterality Date  . ABDOMINAL AORTOGRAM W/LOWER EXTREMITY N/A 06/21/2020   Procedure: ABDOMINAL AORTOGRAM W/LOWER EXTREMITY;  Surgeon: Angelia Mould, MD;  Location: [place] [facility];  Service: Cardiovascular;  Laterality: N/A;   . AV FISTULA PLACEMENT Left 03/28/2020   Procedure: ARTERIOVENOUS (AV) FISTULA CREATION LEFT;  Surgeon: [name], MD;  Location: [facility];  Service: Vascular;  Laterality: Left;  . COLONOSCOPY    . COLONOSCOPY WITH PROPOFOL N/A 06/27/2020   Procedure: COLONOSCOPY WITH PROPOFOL;  Surgeon: Carol Ada, MD;  Location: [facility];  Service: Endoscopy;  Laterality: N/A;  . FINGER SURGERY    . HEMOSTASIS CLIP PLACEMENT  06/27/2020   Procedure: HEMOSTASIS CLIP PLACEMENT;  Surgeon: Carol Ada, MD;  Location: [facility];  Service: Endoscopy;;  . INSERTION OF DIALYSIS CATHETER Right 06/24/2020   Procedure: INSERTION OF TUNNELED  DIALYSIS CATHETER; Removal of temporary dialysis catheter right neck;  Surgeon: Angelia Mould, MD;  Location: [facility];  Service: Vascular;  Laterality: Right;  . LIGATION OF ARTERIOVENOUS  FISTULA Left 07/24/2020   Procedure: LIGATION OF LEFT BRACHIOCEPHALIC ARTERIOVENOUS  FISTULA;  Surgeon: [name], MD;  Location: [facility];  Service: Vascular;  Laterality: Left;  . PERIPHERAL VASCULAR INTERVENTION Right 06/21/2020   Procedure: PERIPHERAL VASCULAR INTERVENTION;  Surgeon: Angelia Mould, MD;  Location: [place] [facility];  Service: Cardiovascular;  Laterality: Right;    MEDICATIONS: No current facility-administered medications for this encounter.   Marland Kitchen albuterol (VENTOLIN HFA) 108 (90 Base) MCG/ACT inhaler  . amLODipine (NORVASC) 10 MG tablet  . aspirin 81 MG EC tablet  . atorvastatin (LIPITOR) 40 MG tablet  . calcium acetate (PHOSLO) 667 MG capsule  . doxazosin (CARDURA) 4 MG tablet  . furosemide (LASIX) 40 MG tablet  . gabapentin (NEURONTIN) 300 MG capsule  . metoprolol tartrate (LOPRESSOR) 25 MG tablet  . metoprolol tartrate (LOPRESSOR) 25 MG tablet  . nitroGLYCERIN (NITROSTAT) 0.4 MG SL tablet  . sodium bicarbonate 650 MG tablet    Myra Gianotti, PA-C Surgical Short Stay/Anesthesiology [facility] Phone [phone] [facility] Phone  [phone] 09/20/2020 10:38 AM

## 2020-09-23 ENCOUNTER — Ambulatory Visit (HOSPITAL_COMMUNITY): Payer: Medicare Other | Admitting: Vascular Surgery

## 2020-09-23 ENCOUNTER — Other Ambulatory Visit: Payer: Self-pay

## 2020-09-23 ENCOUNTER — Encounter (HOSPITAL_COMMUNITY)
Admission: RE | Disposition: A | Discharge status: Home / Self Care | Payer: Self-pay | Source: Home / Self Care | Attending: Vascular Surgery

## 2020-09-23 ENCOUNTER — Encounter (HOSPITAL_COMMUNITY): Payer: Self-pay | Admitting: Vascular Surgery

## 2020-09-23 ENCOUNTER — Ambulatory Visit (HOSPITAL_COMMUNITY)
Admission: RE | Admit: 2020-09-23 | Discharge: 2020-09-23 | Disposition: A | Discharge status: Home / Self Care | Payer: Medicare Other | Attending: Vascular Surgery | Admitting: Vascular Surgery

## 2020-09-23 DIAGNOSIS — E1151 Type 2 diabetes mellitus with diabetic peripheral angiopathy without gangrene: Secondary | ICD-10-CM | POA: Diagnosis not present

## 2020-09-23 DIAGNOSIS — Z8673 Personal history of transient ischemic attack (TIA), and cerebral infarction without residual deficits: Secondary | ICD-10-CM | POA: Insufficient documentation

## 2020-09-23 DIAGNOSIS — Z992 Dependence on renal dialysis: Secondary | ICD-10-CM | POA: Diagnosis not present

## 2020-09-23 DIAGNOSIS — Z8616 Personal history of COVID-19: Secondary | ICD-10-CM | POA: Insufficient documentation

## 2020-09-23 DIAGNOSIS — N186 End stage renal disease: Secondary | ICD-10-CM | POA: Diagnosis not present

## 2020-09-23 DIAGNOSIS — Z79899 Other long term (current) drug therapy: Secondary | ICD-10-CM | POA: Diagnosis not present

## 2020-09-23 DIAGNOSIS — E1122 Type 2 diabetes mellitus with diabetic chronic kidney disease: Secondary | ICD-10-CM | POA: Insufficient documentation

## 2020-09-23 DIAGNOSIS — I120 Hypertensive chronic kidney disease with stage 5 chronic kidney disease or end stage renal disease: Secondary | ICD-10-CM | POA: Diagnosis not present

## 2020-09-23 DIAGNOSIS — Z7982 Long term (current) use of aspirin: Secondary | ICD-10-CM | POA: Insufficient documentation

## 2020-09-23 DIAGNOSIS — Z87891 Personal history of nicotine dependence: Secondary | ICD-10-CM | POA: Diagnosis not present

## 2020-09-23 DIAGNOSIS — N185 Chronic kidney disease, stage 5: Secondary | ICD-10-CM | POA: Diagnosis not present

## 2020-09-23 HISTORY — PX: AV FISTULA PLACEMENT: SHX1204

## 2020-09-23 LAB — POCT I-STAT, CHEM 8
BUN: 67 mg/dL — ABNORMAL HIGH (ref 8–23)
Calcium, Ion: 1 mmol/L — ABNORMAL LOW (ref 1.15–1.40)
Chloride: 107 mmol/L (ref 98–111)
Creatinine, Ser: 10.2 mg/dL — ABNORMAL HIGH (ref 0.61–1.24)
Glucose, Bld: 87 mg/dL (ref 70–99)
HCT: 25 % — ABNORMAL LOW (ref 39.0–52.0)
Hemoglobin: 8.5 g/dL — ABNORMAL LOW (ref 13.0–17.0)
Potassium: 6.1 mmol/L — ABNORMAL HIGH (ref 3.5–5.1)
Sodium: 137 mmol/L (ref 135–145)
TCO2: 25 mmol/L (ref 22–32)

## 2020-09-23 SURGERY — ARTERIOVENOUS (AV) FISTULA CREATION
Anesthesia: General | Site: Arm Lower | Laterality: Right

## 2020-09-23 MED ORDER — SODIUM CHLORIDE 0.9 % IV SOLN: INTRAVENOUS | Status: DC | PRN

## 2020-09-23 MED ORDER — LIDOCAINE HCL (CARDIAC) PF 100 MG/5ML IV SOSY
PREFILLED_SYRINGE | INTRAVENOUS | Status: DC | PRN
  Administered 2020-09-23: 50 mg via INTRAVENOUS

## 2020-09-23 MED ORDER — CHLORHEXIDINE GLUCONATE 0.12 % MT SOLN
15.0000 mL | Freq: Once | OROMUCOSAL | Status: AC
  Administered 2020-09-23: 15 mL via OROMUCOSAL
  Filled 2020-09-23: qty 15

## 2020-09-23 MED ORDER — PROPOFOL 500 MG/50ML IV EMUL
INTRAVENOUS | Status: DC | PRN
  Administered 2020-09-23: 50 ug/kg/min via INTRAVENOUS

## 2020-09-23 MED ORDER — HEPARIN SODIUM (PORCINE) 1000 UNIT/ML IJ SOLN
INTRAMUSCULAR | Status: DC | PRN
  Administered 2020-09-23: 7000 [IU] via INTRAVENOUS

## 2020-09-23 MED ORDER — ACETAMINOPHEN 500 MG PO TABS
1000.0000 mg | ORAL_TABLET | Freq: Once | ORAL | Status: AC
  Administered 2020-09-23: 1000 mg via ORAL
  Filled 2020-09-23: qty 2

## 2020-09-23 MED ORDER — CEFAZOLIN SODIUM-DEXTROSE 2-4 GM/100ML-% IV SOLN
2.0000 g | INTRAVENOUS | Status: AC
  Administered 2020-09-23: 2 g via INTRAVENOUS
  Filled 2020-09-23: qty 100

## 2020-09-23 MED ORDER — ORAL CARE MOUTH RINSE: 15.0000 mL | Freq: Once | OROMUCOSAL | Status: AC

## 2020-09-23 MED ORDER — DEXAMETHASONE SODIUM PHOSPHATE 10 MG/ML IJ SOLN
INTRAMUSCULAR | Status: DC | PRN
  Administered 2020-09-23: 8 mg

## 2020-09-23 MED ORDER — SODIUM CHLORIDE 0.9 % IV SOLN
INTRAVENOUS | Status: AC
  Filled 2020-09-23: qty 1.2

## 2020-09-23 MED ORDER — FENTANYL CITRATE (PF) 100 MCG/2ML IJ SOLN: 25.0000 ug | INTRAMUSCULAR | Status: DC | PRN

## 2020-09-23 MED ORDER — CELECOXIB 200 MG PO CAPS
200.0000 mg | ORAL_CAPSULE | Freq: Once | ORAL | Status: AC
  Administered 2020-09-23: 200 mg via ORAL
  Filled 2020-09-23: qty 1

## 2020-09-23 MED ORDER — LIDOCAINE-EPINEPHRINE (PF) 1 %-1:200000 IJ SOLN
INTRAMUSCULAR | Status: DC | PRN
  Administered 2020-09-23: 30 mL

## 2020-09-23 MED ORDER — FENTANYL CITRATE (PF) 100 MCG/2ML IJ SOLN
INTRAMUSCULAR | Status: AC
  Administered 2020-09-23: 50 ug via INTRAVENOUS
  Filled 2020-09-23: qty 2

## 2020-09-23 MED ORDER — PAPAVERINE HCL 30 MG/ML IJ SOLN
INTRAMUSCULAR | Status: AC
  Filled 2020-09-23: qty 2

## 2020-09-23 MED ORDER — SODIUM CHLORIDE 0.9 % IV SOLN: INTRAVENOUS | Status: DC

## 2020-09-23 MED ORDER — MIDAZOLAM HCL 2 MG/2ML IJ SOLN: 1.0000 mg | Freq: Once | INTRAMUSCULAR | Status: AC

## 2020-09-23 MED ORDER — MIDAZOLAM HCL 2 MG/2ML IJ SOLN
INTRAMUSCULAR | Status: AC
  Administered 2020-09-23: 1 mg via INTRAVENOUS
  Filled 2020-09-23: qty 2

## 2020-09-23 MED ORDER — FENTANYL CITRATE (PF) 100 MCG/2ML IJ SOLN
50.0000 ug | Freq: Once | INTRAMUSCULAR | Status: AC
Start: 2020-09-23 — End: 2020-09-23

## 2020-09-23 MED ORDER — PROTAMINE SULFATE 10 MG/ML IV SOLN
INTRAVENOUS | Status: DC | PRN
  Administered 2020-09-23: 40 mg via INTRAVENOUS

## 2020-09-23 MED ORDER — OXYCODONE-ACETAMINOPHEN 7.5-325 MG PO TABS: 1.0000 | ORAL_TABLET | ORAL | 0 refills | Status: DC | PRN

## 2020-09-23 MED ORDER — CHLORHEXIDINE GLUCONATE 4 % EX LIQD: 60.0000 mL | Freq: Once | CUTANEOUS | Status: DC

## 2020-09-23 MED ORDER — OXYCODONE-ACETAMINOPHEN 7.5-325 MG PO TABS
1.0000 | ORAL_TABLET | ORAL | 0 refills | Status: DC | PRN
  Filled 2020-09-23: qty 20, 4d supply, fill #0

## 2020-09-23 MED ORDER — LACTATED RINGERS IV SOLN: INTRAVENOUS | Status: DC

## 2020-09-23 MED ORDER — ONDANSETRON HCL 4 MG/2ML IJ SOLN
INTRAMUSCULAR | Status: DC | PRN
  Administered 2020-09-23: 4 mg via INTRAVENOUS

## 2020-09-23 MED ORDER — 0.9 % SODIUM CHLORIDE (POUR BTL) OPTIME
TOPICAL | Status: DC | PRN
  Administered 2020-09-23: 1000 mL

## 2020-09-23 MED ORDER — MEPIVACAINE HCL (PF) 2 % IJ SOLN
INTRAMUSCULAR | Status: DC | PRN
  Administered 2020-09-23: 30 mL

## 2020-09-23 SURGICAL SUPPLY — 33 items
ADH SKN CLS APL DERMABOND .7 (GAUZE/BANDAGES/DRESSINGS) ×1
ARMBAND PINK RESTRICT EXTREMIT (MISCELLANEOUS) ×4 IMPLANT
CANISTER SUCT 3000ML PPV (MISCELLANEOUS) ×2 IMPLANT
CANNULA VESSEL 3MM 2 BLNT TIP (CANNULA) ×2 IMPLANT
CLIP VESOCCLUDE MED 6/CT (CLIP) ×2 IMPLANT
CLIP VESOCCLUDE SM WIDE 6/CT (CLIP) ×2 IMPLANT
COVER PROBE W GEL 5X96 (DRAPES) IMPLANT
COVER WAND RF STERILE (DRAPES) ×2 IMPLANT
DECANTER SPIKE VIAL GLASS SM (MISCELLANEOUS) ×2 IMPLANT
DERMABOND ADVANCED (GAUZE/BANDAGES/DRESSINGS) ×1
DERMABOND ADVANCED .7 DNX12 (GAUZE/BANDAGES/DRESSINGS) ×1 IMPLANT
ELECT REM PT RETURN 9FT ADLT (ELECTROSURGICAL) ×2
ELECTRODE REM PT RTRN 9FT ADLT (ELECTROSURGICAL) ×1 IMPLANT
GLOVE BIO SURGEON STRL SZ7.5 (GLOVE) ×2 IMPLANT
GLOVE SRG 8 PF TXTR STRL LF DI (GLOVE) ×1 IMPLANT
GLOVE SURG UNDER POLY LF SZ6.5 (GLOVE) ×3 IMPLANT
GLOVE SURG UNDER POLY LF SZ8 (GLOVE) ×2
GOWN STRL REUS W/ TWL LRG LVL3 (GOWN DISPOSABLE) ×3 IMPLANT
GOWN STRL REUS W/TWL LRG LVL3 (GOWN DISPOSABLE) ×6
GRAFT GORETEX STRT 4-7X45 (Vascular Products) ×1 IMPLANT
KIT BASIN OR (CUSTOM PROCEDURE TRAY) ×2 IMPLANT
KIT TURNOVER KIT B (KITS) ×2 IMPLANT
NS IRRIG 1000ML POUR BTL (IV SOLUTION) ×2 IMPLANT
PACK CV ACCESS (CUSTOM PROCEDURE TRAY) ×2 IMPLANT
PAD ARMBOARD 7.5X6 YLW CONV (MISCELLANEOUS) ×4 IMPLANT
SPONGE SURGIFOAM ABS GEL 100 (HEMOSTASIS) IMPLANT
SUT MNCRL AB 4-0 PS2 18 (SUTURE) ×3 IMPLANT
SUT PROLENE 6 0 BV (SUTURE) ×4 IMPLANT
SUT VIC AB 3-0 SH 27 (SUTURE) ×4
SUT VIC AB 3-0 SH 27X BRD (SUTURE) ×1 IMPLANT
TOWEL GREEN STERILE (TOWEL DISPOSABLE) ×2 IMPLANT
UNDERPAD 30X36 HEAVY ABSORB (UNDERPADS AND DIAPERS) ×2 IMPLANT
WATER STERILE IRR 1000ML POUR (IV SOLUTION) ×2 IMPLANT

## 2020-09-23 NOTE — Progress Notes (Signed)
Renal Navigator appreciates call from S. Setzer/VVS PA, who states patient has had graft procedure completed this morning and K is now found to be 6.1. Navigator spoke with Dr. Jonnie Finner to make plan for HD today-he is agreeable for patient to go to outpatient clinic today if they can accommodate patient. Navigator spoke with Charge RN/Margaret at [facility] clinic who states they can accommodate patient today as long as he arrives by 12:30pm. This will be an off schedule treatment (extra today) as patient's normal schedule is TTS.   Navigator called VVS PA back to inform of above. She confirmed that patient's wife is with him and Navigator knows from previous talks with patient's wife that she drives him to/from HD. VVS PA will instruct PACU staff to have patient/wife go straight to outpatient HD clinic today for treatment for high [place] clinic staff updated.   Alphonzo Cruise, [place] Renal Navigator [phone]

## 2020-09-23 NOTE — Op Note (Signed)
    NAME: Albert Harris    MRN: [id number] DOB: 10-11-1955    DATE OF OPERATION: 09/23/2020  PREOP DIAGNOSIS:    End-stage renal disease  POSTOP DIAGNOSIS:    Same  PROCEDURE:    New right forearm AV graft (4-7 mm PTFE graft)  SURGEON: Judeth Cornfield. Scot Dock, MD  ASSIST: Marlinda Mike, PA  ANESTHESIA: Axillary block  EBL: Minimal  INDICATIONS:    [name] is a 65 y.o. male who has a functioning right IJ tunneled dialysis catheter.  He dialyzes on Tuesdays Thursdays and Saturdays.  He had a graft ligated in his left arm for steal.  He presents for new access in the right arm.  FINDINGS:   3-1/2 mm brachial vein.  3-1/2 mm brachial artery with some plaque noted.  TECHNIQUE:   The patient was taken to the operating room after an axillary block was placed.  The right arm was prepped and draped in usual sterile fashion.  A longitudinal incision was made above the antecubital level where the brachial artery was dissected free.  He did have some plaque noted.  The adjacent brachial vein was dissected free.  Using 1 distal counterincision a 4-7 mm PTFE graft was tunneled in a loop fashion in the forearm with the arterial aspect of the graft tunneled along the ulnar aspect of the forearm.  The patient was heparinized.  The brachial artery was clamped proximally and distally and a longitudinal arteriotomy was made.  A short segment of the 4 mm of the graft was excised, the graft slightly spatulated and sewn end-to-side to the brachial artery using continuous 6-0 Prolene suture.  The graft then pulled the appropriate length for anastomosis to the brachial vein.  The vein was ligated distally and spatulated proximally.  The graft was cut to the appropriate length, spatulated and sewn into into the vein using 2 continuous 6-0 Prolene sutures.  At the completion was a good thrill in the graft with a radial signal with a Doppler.  Hemostasis was obtained in the wound.  The heparin was  partially reversed with protamine.  The wound was closed with a deep layer of 3-0 Vicryl the skin closed with 4-0 Vicryl.  Dermabond was applied.  The patient tolerated procedure well was transferred to recovery room in stable condition.  All needle and sponge counts were correct.  Given the complexity of the case a first assistant was necessary in order to expedient the procedure and safely perform the technical aspects of the operation.  [name], MD, FACS [facility]  DATE OF DICTATION:   09/23/2020

## 2020-09-23 NOTE — Interval H&P Note (Signed)
History and Physical Interval Note:  09/23/2020 9:42 AM  Albert Harris  has presented today for surgery, with the diagnosis of ESRD.  The various methods of treatment have been discussed with the patient and family. After consideration of risks, benefits and other options for treatment, the patient has consented to  Procedure(s): RIGHT ARTERIOVENOUS (AV) FISTULA VERSUS ARTERIOVENOUS GRAFT CREATION (Right) as a surgical intervention.  [name] history has been reviewed, patient examined, no change in status, stable for surgery.  I have reviewed the patient's chart and labs.  Questions were answered to the patient's satisfaction.     [name]

## 2020-09-23 NOTE — Transfer of Care (Signed)
Immediate Anesthesia Transfer of Care Note  Patient: Albert Harris  Procedure(s) Performed: RIGHT Arm ARTERIOVENOUS GRAFT CREATION. (Right Arm Lower)  Patient Location: PACU  Anesthesia Type:MAC combined with regional for post-op pain  Level of Consciousness: awake, alert , oriented and patient cooperative  Airway & Oxygen Therapy: Patient Spontanous Breathing  Post-op Assessment: Report given to RN, Post -op Vital signs reviewed and stable and Patient moving all extremities X 4  Post vital signs: Reviewed and stable  Last Vitals:  Vitals Value Taken Time  BP    Temp    Pulse 67 09/23/20 1144  Resp 20 09/23/20 1144  SpO2 94 % 09/23/20 1144  Vitals shown include unvalidated device data.  Last Pain:  Vitals:   09/23/20 0814  TempSrc:   PainSc: 0-No pain         Complications: No complications documented.

## 2020-09-23 NOTE — Progress Notes (Signed)
Dr Tobias Alexander notified of BP 197/86.

## 2020-09-23 NOTE — Addendum Note (Signed)
Addendum  created 09/23/20 1315 by Duane Boston, MD   Clinical Note Signed

## 2020-09-23 NOTE — Discharge Instructions (Addendum)
° °  [facility] ° °Discharge Instructions ° °AV Fistula or Graft Surgery for Dialysis Access ° °Please refer to the following instructions for your post-procedure care. Your surgeon or physician assistant will discuss any changes with you. ° °Activity ° °You may drive the day following your surgery, if you are comfortable and no longer taking prescription pain medication. Resume full activity as the soreness in your incision resolves. ° °Bathing/Showering ° °You may shower after you go home. Keep your incision dry for 48 hours. Do not soak in a bathtub, hot tub, or swim until the incision heals completely. You may not shower if you have a hemodialysis catheter. ° °Incision Care ° °Clean your incision with mild soap and water after 48 hours. Pat the area dry with a clean towel. You do not need a bandage unless otherwise instructed. Do not apply any ointments or creams to your incision. You may have skin glue on your incision. Do not peel it off. It will come off on its own in about one week. Your arm may swell a bit after surgery. To reduce swelling use pillows to elevate your arm so it is above your heart. Your doctor will tell you if you need to lightly wrap your arm with an ACE bandage. ° °Diet ° °Resume your normal diet. There are not special food restrictions following this procedure. In order to heal from your surgery, it is CRITICAL to get adequate nutrition. Your body requires vitamins, minerals, and protein. Vegetables are the best source of vitamins and minerals. Vegetables also provide the perfect balance of protein. Processed food has little nutritional value, so try to avoid this. ° °Medications ° °Resume taking all of your medications. If your incision is causing pain, you may take over-the counter pain relievers such as acetaminophen (Tylenol). If you were prescribed a stronger pain medication, please be aware these medications can cause nausea and constipation. Prevent  nausea by taking the medication with a snack or meal. Avoid constipation by drinking plenty of fluids and eating foods with high amount of fiber, such as fruits, vegetables, and grains. Do not take Tylenol if you are taking prescription pain medications. ° ° ° ° °Follow up °Your surgeon may want to see you in the office following your access surgery. If so, this will be arranged at the time of your surgery. ° °Please call us immediately for any of the following conditions: ° °Increased pain, redness, drainage (pus) from your incision site °Fever of 101 degrees or higher °Severe or worsening pain at your incision site °Hand pain or numbness. ° °Reduce your risk of vascular disease: ° °Stop smoking. If you would like help, call [facility] at 1-800-QUIT-NOW (1-800-784-8669) or [place] at 336-586-4000 ° °Manage your cholesterol °Maintain a desired weight °Control your diabetes °Keep your blood pressure down ° °Dialysis ° °It will take several weeks to several months for your new dialysis access to be ready for use. Your surgeon will determine when it is OK to use it. Your nephrologist will continue to direct your dialysis. You can continue to use your Permcath until your new access is ready for use. ° °If you have any questions, please call the office at [phone]. ° °

## 2020-09-23 NOTE — Anesthesia Procedure Notes (Signed)
Anesthesia Regional Block: Interscalene brachial plexus block   Pre-Anesthetic Checklist: ,, timeout performed, Correct Patient, Correct Site, Correct Laterality, Correct Procedure, Correct Position, site marked, Risks and benefits discussed,  Surgical consent,  Pre-op evaluation,  At surgeon's request and post-op pain management  Laterality: Right  Prep: chloraprep       Needles:  Injection technique: Single-shot  Needle Type: Echogenic Stimulator Needle     Needle Length: 5cm  Needle Gauge: 22     Additional Needles:   Narrative:  Start time: 09/23/2020 9:04 AM End time: 09/23/2020 9:14 AM Injection made incrementally with aspirations every 5 mL.  Performed by: Personally  Anesthesiologist: Duane Boston, MD  Additional Notes: Functioning IV was confirmed and monitors applied.  A 42m 22ga echogenic arrow stimulator was used. Sterile prep and drape,hand hygiene and sterile gloves were used.Ultrasound guidance: relevant anatomy identified, needle position confirmed, local anesthetic spread visualized around nerve(s)., vascular puncture avoided.  Image printed for medical record.  Negative aspiration and negative test dose prior to incremental administration of local anesthetic. The patient tolerated the procedure well.

## 2020-09-23 NOTE — Anesthesia Postprocedure Evaluation (Addendum)
Anesthesia Post Note  Patient: Albert Harris  Procedure(s) Performed: RIGHT Arm ARTERIOVENOUS GRAFT CREATION. (Right Arm Lower)     Patient location during evaluation: PACU Anesthesia Type: MAC and Bier Block Level of consciousness: awake and alert Pain management: pain level controlled Vital Signs Assessment: post-procedure vital signs reviewed and stable Respiratory status: spontaneous breathing and respiratory function stable Cardiovascular status: stable Postop Assessment: no apparent nausea or vomiting Anesthetic complications: no Comments: To receive Hemodialysis after leaving PACU.   No complications documented.  Last Vitals:  Vitals:   09/23/20 1215 09/23/20 1230  BP: 137/73 140/72  Pulse: 69 67  Resp: (!) 22 (!) 23  Temp:    SpO2: 93% 95%                  Johnjoseph Rolfe DANIEL

## 2020-09-23 NOTE — Anesthesia Preprocedure Evaluation (Addendum)
Anesthesia Evaluation  Patient identified by MRN, date of birth, ID band Patient awake    Reviewed: Allergy & Precautions, NPO status , Patient's Chart, lab work & pertinent test results, reviewed documented beta blocker date and time   History of Anesthesia Complications Negative for: history of anesthetic complications  Airway Mallampati: II  TM Distance: >3 FB     Dental  (+) Poor Dentition, Missing   Pulmonary asthma , COPD,  COPD inhaler, former smoker,  COVID 06/16/20   Pulmonary exam normal        Cardiovascular hypertension, Pt. on home beta blockers and Pt. on medications + Peripheral Vascular Disease  Normal cardiovascular exam     Neuro/Psych [name]   GI/Hepatic Neg liver ROS, GERD  ,  Endo/Other  negative endocrine ROS  Renal/GU ESRF and DialysisRenal disease (COMPLICATION OF AVF)     Musculoskeletal negative musculoskeletal ROS (+)   Abdominal   Peds  Hematology  (+) Blood dyscrasia, anemia ,   Anesthesia Other Findings Day of surgery medications reviewed with the patient.  Reproductive/Obstetrics                            Anesthesia Physical  Anesthesia Plan  ASA: III  Anesthesia Plan: General   Post-op Pain Management:    Induction: Intravenous  PONV Risk Score and Plan: 2 and Dexamethasone and Ondansetron  Airway Management Planned: LMA  Additional Equipment:   Intra-op Plan:   Post-operative Plan: Extubation in OR  Informed Consent: I have reviewed the patients History and Physical, chart, labs and discussed the procedure including the risks, benefits and alternatives for the proposed anesthesia with the patient or authorized representative who has indicated his/her understanding and acceptance.     Dental advisory given  Plan Discussed with: CRNA and Anesthesiologist  Anesthesia Plan Comments: (K is 6.1, pt had dialysis Thursday, skipped Saturday to  quarantine for surgery. Discussed with Dr. Scot Dock.  Will proceed with MAC case and receive dialysis today after surgery.  )      Anesthesia Quick Evaluation

## 2020-09-24 ENCOUNTER — Encounter (HOSPITAL_COMMUNITY): Payer: Self-pay | Admitting: Vascular Surgery

## 2020-09-25 ENCOUNTER — Encounter (HOSPITAL_COMMUNITY): Payer: Self-pay | Admitting: Vascular Surgery

## 2020-10-08 ENCOUNTER — Other Ambulatory Visit: Payer: Self-pay

## 2020-10-08 MED FILL — Gabapentin Cap 300 MG: ORAL | 30 days supply | Qty: 90 | Fill #0 | Status: AC

## 2020-10-09 ENCOUNTER — Ambulatory Visit (INDEPENDENT_AMBULATORY_CARE_PROVIDER_SITE_OTHER): Payer: Medicare Other | Admitting: Physician Assistant

## 2020-10-09 ENCOUNTER — Other Ambulatory Visit: Payer: Self-pay

## 2020-10-09 VITALS — BP 198/104 | HR 76 | Temp 98.6°F | Resp 20 | Ht 68.0 in | Wt 158.2 lb

## 2020-10-09 DIAGNOSIS — Z992 Dependence on renal dialysis: Secondary | ICD-10-CM

## 2020-10-09 DIAGNOSIS — N186 End stage renal disease: Secondary | ICD-10-CM

## 2020-10-09 DIAGNOSIS — I739 Peripheral vascular disease, unspecified: Secondary | ICD-10-CM

## 2020-10-09 NOTE — Progress Notes (Signed)
Office Note     CC:  follow up Requesting Provider:  Kerin Perna, NP  HPI: Albert Harris is a 65 y.o. (Jul 06, 1955) male who presents status post right forearm loop AV graft placement by Dr. Scot Dock on 09/23/2020.  He previously had a left brachiocephalic ligated due to steal syndrome.  He is currently dialyzing via right IJ [facility] on a Tuesday Thursday Saturday schedule at the [facility] kidney center in [place].  He denies any signs or symptoms of steal syndrome in his right hand.  He states the incisions of his right arm are well-healed.  He has also been seen in the past for PAD with history of bilateral common and external iliac artery stenting by Dr. Scot Dock in January 2022 during prolonged hospitalization and ICU stay for COVID-pneumonia.  He is complaining of severe right great toe pain keeps him from walking.  Pain is worse when he elevates his leg. He denies any open wounds of his right foot.  He has a known right SFA occlusion with calcified common femoral and deep femoral arteries as well as diffusely calcified tibial vessel three-vessel runoff based on January's angiography.  Dr. Doren Custard mentioned in his last office note that patient would require a right femoral to below the knee popliteal artery bypass in the future if he had any problems with circulation in the future.  He is a former smoker.  He is on aspirin and statin daily.  Past Medical History:  Diagnosis Date  . Anemia   . Asthma   . Carotid stenosis 09/12/2019  . Chronic kidney disease    t, th. sat dialysis  . Claudication in peripheral vascular disease ([place]) 09/12/2019  . Complication of anesthesia    " i HAVE A HARD TIME WAKING UP " (only one time)  . COPD (chronic obstructive pulmonary disease) ([place])   . COVID-19    positive on  06/16/20  . GERD (gastroesophageal reflux disease)   . HLD (hyperlipidemia)   . Hypertension   . Peripheral vascular disease ([place])   . PFO with atrial septal aneurysm    by echo  11/2017  . Pneumonia   . Stroke ([place])   . [name] (transient ischemic attack) 11/2017  . Tobacco abuse    quit 07/17/19    Past Surgical History:  Procedure Laterality Date  . ABDOMINAL AORTOGRAM W/LOWER EXTREMITY N/A 06/21/2020   Procedure: ABDOMINAL AORTOGRAM W/LOWER EXTREMITY;  Surgeon: Angelia Mould, MD;  Location: [place] [facility];  Service: Cardiovascular;  Laterality: N/A;  . AV FISTULA PLACEMENT Left 03/28/2020   Procedure: ARTERIOVENOUS (AV) FISTULA CREATION LEFT;  Surgeon: [name], MD;  Location: [place];  Service: Vascular;  Laterality: Left;  . AV FISTULA PLACEMENT Right 09/23/2020   Procedure: RIGHT Arm ARTERIOVENOUS GRAFT CREATION.;  Surgeon: Angelia Mould, MD;  Location: [place];  Service: Vascular;  Laterality: Right;  . COLONOSCOPY    . COLONOSCOPY WITH PROPOFOL N/A 06/27/2020   Procedure: COLONOSCOPY WITH PROPOFOL;  Surgeon: Carol Ada, MD;  Location: [place];  Service: Endoscopy;  Laterality: N/A;  . FINGER SURGERY    . HEMOSTASIS CLIP PLACEMENT  06/27/2020   Procedure: HEMOSTASIS CLIP PLACEMENT;  Surgeon: Carol Ada, MD;  Location: [place];  Service: Endoscopy;;  . INSERTION OF DIALYSIS CATHETER Right 06/24/2020   Procedure: INSERTION OF TUNNELED  DIALYSIS CATHETER; Removal of temporary dialysis catheter right neck;  Surgeon: Angelia Mould, MD;  Location: [place];  Service: Vascular;  Laterality: Right;  .  LIGATION OF ARTERIOVENOUS  FISTULA Left 07/24/2020   Procedure: LIGATION OF LEFT BRACHIOCEPHALIC ARTERIOVENOUS  FISTULA;  Surgeon: [name], MD;  Location: [place];  Service: Vascular;  Laterality: Left;  . PERIPHERAL VASCULAR INTERVENTION Right 06/21/2020   Procedure: PERIPHERAL VASCULAR INTERVENTION;  Surgeon: Angelia Mould, MD;  Location: [place] [facility];  Service: Cardiovascular;  Laterality: Right;    Social History   Socioeconomic History  . Marital status: Divorced    Spouse name: Not on file   . Number of children: Not on file  . Years of education: Not on file  . Highest education level: Not on file  Occupational History  . Not on file  Tobacco Use  . Smoking status: Former Smoker    Packs/day: 1.00    Years: 50.00    Pack years: 50.00    Types: Cigarettes    Quit date: 07/17/2019    Years since quitting: 1.2  . Smokeless tobacco: Never Used  Vaping Use  . Vaping Use: Never used  Substance and Sexual Activity  . Alcohol use: Not Currently    Alcohol/week: 1.0 - 3.0 standard drink    Types: 1 - 3 Cans of beer per week  . Drug use: No  . Sexual activity: Not Currently  Other Topics Concern  . Not on file  Social History Narrative   Married   Works in Audiological scientist   Social Determinants of Health   Financial Resource Strain: Not on file  Food Insecurity: Not on file  Transportation Needs: Not on file  Physical Activity: Not on file  Stress: Not on file  Social Connections: Not on file  Intimate Partner Violence: Not on file    Family History  Problem Relation Age of Onset  . Hypertension Mother   . Colon cancer Father   . Stomach cancer Brother     Current Outpatient Medications  Medication Sig Dispense Refill  . albuterol (VENTOLIN HFA) 108 (90 Base) MCG/ACT inhaler INHALE 1-2 PUFFS INTO THE LUNGS EVERY 6 (SIX) HOURS AS NEEDED FOR WHEEZING OR SHORTNESS OF BREATH. 8 g 0  . amLODipine (NORVASC) 10 MG tablet Take 1 tablet (10 mg total) by mouth daily. (Patient taking differently: Take 10 mg by mouth in the morning.) 30 tablet 3  . aspirin 81 MG EC tablet TAKE 1 TABLET (81 MG TOTAL) BY MOUTH DAILY. SWALLOW WHOLE. (Patient taking differently: Take by mouth 5 (five) times daily. SWALLOW WHOLE.) 30 tablet 11  . atorvastatin (LIPITOR) 40 MG tablet TAKE 1 TABLET (40 MG TOTAL) BY MOUTH DAILY AT 6 PM. (Patient taking differently: Take by mouth daily. at 6pm) 30 tablet 0  . calcium acetate (PHOSLO) 667 MG capsule TAKE 2 CAPSULES BY MOUTH THREE TIMES  DAILY WITH MEALS (Patient taking differently: Take 667-1,334 mg by mouth 3 (three) times daily with meals. Take 2 capsules (1334 mg) by mouth with each meal & take 1 capsule (667 mg) by  mouth with each snack) 180 capsule 11  . furosemide (LASIX) 40 MG tablet TAKE 1 TABLET BY MOUTH TWICE A DAY, AT 8AM AND 2PM 180 tablet 3  . gabapentin (NEURONTIN) 300 MG capsule TAKE 1 CAPSULE (300 MG TOTAL) BY MOUTH 3 (THREE) TIMES DAILY. 90 capsule 2  . metoprolol tartrate (LOPRESSOR) 25 MG tablet TAKE 1 TABLET BY MOUTH TWICE A DAY (Patient taking differently: Take 25 mg by mouth 2 (two) times daily.) 60 tablet 6  . nitroGLYCERIN (NITROSTAT) 0.4 MG SL tablet Place  1 tablet (0.4 mg total) under the tongue every 5 (five) minutes x 3 doses as needed for chest pain. 30 tablet 0  . oxyCODONE-acetaminophen (PERCOCET) 7.5-325 MG tablet Take 1 tablet by mouth every 4 (four) hours as needed for severe pain. 20 tablet 0  . sodium bicarbonate 650 MG tablet Take 650 mg by mouth daily as needed for heartburn.    . doxazosin (CARDURA) 4 MG tablet TAKE 1.5 TABLETS (6 MG TOTAL) BY MOUTH DAILY. (Patient not taking: No sig reported) 135 tablet 1   No current facility-administered medications for this visit.    No Known Allergies   REVIEW OF SYSTEMS:   '[X]'$  denotes positive finding, '[ ]'$  denotes negative finding Cardiac  Comments:  Chest pain or chest pressure:    Shortness of breath upon exertion:    Short of breath when lying flat:    Irregular heart rhythm:        Vascular    Pain in calf, thigh, or hip brought on by ambulation:    Pain in feet at night that wakes you up from your sleep:     Blood clot in your veins:    Leg swelling:         Pulmonary    Oxygen at home:    Productive cough:     Wheezing:         Neurologic    Sudden weakness in arms or legs:     Sudden numbness in arms or legs:     Sudden onset of difficulty speaking or slurred speech:    Temporary loss of vision in one eye:     Problems with  dizziness:         Gastrointestinal    Blood in stool:     Vomited blood:         Genitourinary    Burning when urinating:     Blood in urine:        Psychiatric    Major depression:         Hematologic    Bleeding problems:    Problems with blood clotting too easily:        Skin    Rashes or ulcers:        Constitutional    Fever or chills:      PHYSICAL EXAMINATION:  Vitals:   10/09/20 1113  BP: (!) 198/104  Pulse: 76  Resp: 20  Temp: 98.6 F (37 C)  TempSrc: Temporal  SpO2: 100%  Weight: 158 lb 3.2 oz (71.8 kg)  Height: '5\' 8"'$  (1.727 m)    General:  WDWN in NAD; vital signs documented above Gait: Not observed HENT: WNL, normocephalic Pulmonary: normal non-labored breathing Cardiac: regular HR Abdomen: soft, NT, no masses Skin: without rashes Vascular Exam/Pulses:  Right Left  Radial 1+ (weak) 2+ (normal)  DP absent absent  PT absent absent   Extremities: Right arm incisions well-healed; symmetrical grip strength; palpable thrill through right forearm loop graft; pitting edema of bilateral feet and ankles; brisk right DP and PT signal by Doppler Musculoskeletal: no muscle wasting or atrophy  Neurologic: A&O X 3;  No focal weakness or paresthesias are detected Psychiatric:  The pt has Normal affect.    ASSESSMENT/PLAN:: 65 y.o. male here for follow up after right forearm AV graft placement; he is also complaining of worsening right great toe pain status post iliac stenting and known multilevel infrainguinal disease  -Patent right forearm loop AV graft without steal symptoms -Okay  to cannulate AV graft on 10/21/2020; [facility] can be removed when nephrology is comfortable with the performance of the AV graft  -Dr. Scot Dock also evaluated the patient today given his worsening right great toe pain and history of PAD.  Dr. Scot Dock offered the patient right femoral to below the knee popliteal artery bypass which had been previously discussed.  Having gone through  left arm fistula creation then subsequent ligation as well as right forearm loop graft placement, the patient does not want to have any further surgery currently.  He will however think about proceeding with his bypass and notify our office if he changes his mind.  He will also need to have cardiac clearance with his cardiologist Dr. Skeet Latch prior to scheduling bypass surgery.  Patient is otherwise scheduled to come back to see Dr. Scot Dock in 2 to 3 months with an ABI   Dagoberto Ligas, PA-C [facility] [phone]  Clinic MD:  Dr. Scot Dock

## 2020-10-10 ENCOUNTER — Other Ambulatory Visit: Payer: Self-pay

## 2020-10-10 DIAGNOSIS — I739 Peripheral vascular disease, unspecified: Secondary | ICD-10-CM

## 2020-10-14 ENCOUNTER — Ambulatory Visit (INDEPENDENT_AMBULATORY_CARE_PROVIDER_SITE_OTHER): Payer: Medicare Other | Admitting: Primary Care

## 2020-11-19 ENCOUNTER — Encounter (HOSPITAL_COMMUNITY): Payer: Self-pay

## 2020-11-19 ENCOUNTER — Other Ambulatory Visit: Payer: Self-pay

## 2020-11-19 MED ORDER — HYDROXYZINE HCL 50 MG PO TABS
ORAL_TABLET | ORAL | 6 refills | Status: DC
  Filled 2020-11-19: qty 120, 30d supply, fill #0
  Filled 2021-03-05: qty 120, 30d supply, fill #1
  Filled 2021-04-04: qty 120, 30d supply, fill #2
  Filled 2021-05-05: qty 120, 30d supply, fill #3

## 2020-11-20 ENCOUNTER — Other Ambulatory Visit: Payer: Self-pay

## 2020-11-27 ENCOUNTER — Other Ambulatory Visit: Payer: Self-pay

## 2020-11-27 ENCOUNTER — Encounter (HOSPITAL_COMMUNITY): Payer: Self-pay

## 2020-11-27 ENCOUNTER — Ambulatory Visit (INDEPENDENT_AMBULATORY_CARE_PROVIDER_SITE_OTHER): Payer: Medicare Other | Admitting: Primary Care

## 2020-11-27 ENCOUNTER — Encounter (INDEPENDENT_AMBULATORY_CARE_PROVIDER_SITE_OTHER): Payer: Self-pay | Admitting: Primary Care

## 2020-11-27 VITALS — BP 157/68 | HR 70 | Temp 97.4°F | Resp 16 | Ht 68.0 in | Wt 160.6 lb

## 2020-11-27 DIAGNOSIS — Z1211 Encounter for screening for malignant neoplasm of colon: Secondary | ICD-10-CM

## 2020-11-27 DIAGNOSIS — I10 Essential (primary) hypertension: Secondary | ICD-10-CM | POA: Diagnosis not present

## 2020-11-27 DIAGNOSIS — N186 End stage renal disease: Secondary | ICD-10-CM | POA: Diagnosis not present

## 2020-11-27 DIAGNOSIS — Z23 Encounter for immunization: Secondary | ICD-10-CM

## 2020-11-27 DIAGNOSIS — E785 Hyperlipidemia, unspecified: Secondary | ICD-10-CM | POA: Diagnosis not present

## 2020-11-27 DIAGNOSIS — K047 Periapical abscess without sinus: Secondary | ICD-10-CM | POA: Diagnosis not present

## 2020-11-27 DIAGNOSIS — R110 Nausea: Secondary | ICD-10-CM

## 2020-11-27 MED ORDER — AMLODIPINE BESYLATE 10 MG PO TABS
10.0000 mg | ORAL_TABLET | Freq: Every morning | ORAL | 1 refills | Status: DC
  Filled 2020-11-27: qty 30, 30d supply, fill #0

## 2020-11-27 MED ORDER — ONDANSETRON 4 MG PO TBDP
4.0000 mg | ORAL_TABLET | Freq: Three times a day (TID) | ORAL | 0 refills | Status: DC | PRN
  Filled 2020-11-27: qty 20, 7d supply, fill #0

## 2020-11-27 MED ORDER — AMOXICILLIN-POT CLAVULANATE 875-125 MG PO TABS
1.0000 | ORAL_TABLET | Freq: Two times a day (BID) | ORAL | 0 refills | Status: DC
  Filled 2020-11-27: qty 20, 10d supply, fill #0

## 2020-11-27 NOTE — Progress Notes (Signed)
Acute Office Visit  Subjective:    [name], male    DOB: 02-18-1956, 65 y.o.   MRN: [id number]  Dental pain   HPI Albert Harris is a 65 year old male in today for left jaw swollen bottom tooth hurts unable to chew on left side sensitive to temperature changes- previously provided with dental number to call he did - they stated they would call him back and did not will provide more dental resources. Rashes disperse on body with pruritus. He has dialysis 3 times a  week explained this is a common reaction. States he has already inform HD.  Past Medical History:  Diagnosis Date   Anemia    Asthma    Carotid stenosis 09/12/2019   Chronic kidney disease    t, th. sat dialysis   Claudication in peripheral vascular disease ([place]) [id number]   Complication of anesthesia    " i HAVE A HARD TIME WAKING UP " (only one time)   COPD (chronic obstructive pulmonary disease) ([place])    COVID-19    positive on  06/16/20   GERD (gastroesophageal reflux disease)    HLD (hyperlipidemia)    Hypertension    Peripheral vascular disease ([facility])    PFO with atrial septal aneurysm    by echo 11/2017   Pneumonia    Stroke [facility])    [name] (transient ischemic attack) 11/2017   Tobacco abuse    quit 07/17/19    Past Surgical History:  Procedure Laterality Date   ABDOMINAL AORTOGRAM W/LOWER EXTREMITY N/A 06/21/2020   Procedure: ABDOMINAL AORTOGRAM W/LOWER EXTREMITY;  Surgeon: Angelia Mould, MD;  Location: [place] [facility];  Service: Cardiovascular;  Laterality: N/A;   AV FISTULA PLACEMENT Left 03/28/2020   Procedure: ARTERIOVENOUS (AV) FISTULA CREATION LEFT;  Surgeon: [name], MD;  Location: [place];  Service: Vascular;  Laterality: Left;   AV FISTULA PLACEMENT Right 09/23/2020   Procedure: RIGHT Arm ARTERIOVENOUS GRAFT CREATION.;  Surgeon: Angelia Mould, MD;  Location: [place];  Service: Vascular;  Laterality: Right;   COLONOSCOPY     COLONOSCOPY WITH PROPOFOL  N/A 06/27/2020   Procedure: COLONOSCOPY WITH PROPOFOL;  Surgeon: Carol Ada, MD;  Location: [place];  Service: Endoscopy;  Laterality: N/A;   FINGER SURGERY     HEMOSTASIS CLIP PLACEMENT  06/27/2020   Procedure: HEMOSTASIS CLIP PLACEMENT;  Surgeon: Carol Ada, MD;  Location: [place];  Service: Endoscopy;;   INSERTION OF DIALYSIS CATHETER Right 06/24/2020   Procedure: INSERTION OF TUNNELED  DIALYSIS CATHETER; Removal of temporary dialysis catheter right neck;  Surgeon: Angelia Mould, MD;  Location: [place];  Service: Vascular;  Laterality: Right;   LIGATION OF ARTERIOVENOUS  FISTULA Left 07/24/2020   Procedure: LIGATION OF LEFT BRACHIOCEPHALIC ARTERIOVENOUS  FISTULA;  Surgeon: [name], MD;  Location: [employer];  Service: Vascular;  Laterality: Left;   PERIPHERAL VASCULAR INTERVENTION Right 06/21/2020   Procedure: PERIPHERAL VASCULAR INTERVENTION;  Surgeon: Angelia Mould, MD;  Location: [place] [facility];  Service: Cardiovascular;  Laterality: Right;    Family History  Problem Relation Age of Onset   Hypertension Mother    Colon cancer Father    Stomach cancer Brother     Social History   Socioeconomic History   Marital status: Divorced    Spouse name: Not on file   Number of children: Not on file   Years of education: Not on file   Highest education level:  Not on file  Occupational History   Not on file  Tobacco Use   Smoking status: Former    Packs/day: 1.00    Years: 50.00    Pack years: 50.00    Types: Cigarettes    Quit date: 07/17/2019    Years since quitting: 1.3   Smokeless tobacco: Never  Vaping Use   Vaping Use: Never used  Substance and Sexual Activity   Alcohol use: Not Currently    Alcohol/week: 1.0 - 3.0 standard drink    Types: 1 - 3 Cans of beer per week   Drug use: No   Sexual activity: Not Currently  Other Topics Concern   Not on file  Social History Narrative   Married   Works in Audiological scientist    Social Determinants of Radio broadcast assistant Strain: Not on file  Food Insecurity: Not on file  Transportation Needs: Not on file  Physical Activity: Not on file  Stress: Not on file  Social Connections: Not on file  Intimate Partner Violence: Not on file    Outpatient Medications Prior to Visit  Medication Sig Dispense Refill   albuterol (VENTOLIN HFA) 108 (90 Base) MCG/ACT inhaler INHALE 1-2 PUFFS INTO THE LUNGS EVERY 6 (SIX) HOURS AS NEEDED FOR WHEEZING OR SHORTNESS OF BREATH. 8 g 0   aspirin 81 MG EC tablet TAKE 1 TABLET (81 MG TOTAL) BY MOUTH DAILY. SWALLOW WHOLE. (Patient taking differently: Take by mouth 5 (five) times daily. SWALLOW WHOLE.) 30 tablet 11   atorvastatin (LIPITOR) 40 MG tablet TAKE 1 TABLET (40 MG TOTAL) BY MOUTH DAILY AT 6 PM. (Patient taking differently: Take by mouth daily. at 6pm) 30 tablet 0   calcium acetate (PHOSLO) 667 MG capsule TAKE 2 CAPSULES BY MOUTH THREE TIMES DAILY WITH MEALS (Patient taking differently: Take 667-1,334 mg by mouth 3 (three) times daily with meals. Take 2 capsules (1334 mg) by mouth with each meal & take 1 capsule (667 mg) by  mouth with each snack) 180 capsule 11   doxazosin (CARDURA) 4 MG tablet TAKE 1.5 TABLETS (6 MG TOTAL) BY MOUTH DAILY. (Patient not taking: No sig reported) 135 tablet 1   furosemide (LASIX) 40 MG tablet TAKE 1 TABLET BY MOUTH TWICE A DAY, AT 8AM AND 2PM 180 tablet 3   gabapentin (NEURONTIN) 300 MG capsule TAKE 1 CAPSULE (300 MG TOTAL) BY MOUTH 3 (THREE) TIMES DAILY. 90 capsule 2   hydrOXYzine (ATARAX/VISTARIL) 50 MG tablet Take 1 tablet by mouth four times a day as needed prn for itching on dialysis days 120 tablet 6   metoprolol tartrate (LOPRESSOR) 25 MG tablet TAKE 1 TABLET BY MOUTH TWICE A DAY (Patient taking differently: Take 25 mg by mouth 2 (two) times daily.) 60 tablet 6   nitroGLYCERIN (NITROSTAT) 0.4 MG SL tablet Place 1 tablet (0.4 mg total) under the tongue every 5 (five) minutes x 3 doses as  needed for chest pain. 30 tablet 0   oxyCODONE-acetaminophen (PERCOCET) 7.5-325 MG tablet Take 1 tablet by mouth every 4 (four) hours as needed for severe pain. 20 tablet 0   sodium bicarbonate 650 MG tablet Take 650 mg by mouth daily as needed for heartburn.     amLODipine (NORVASC) 10 MG tablet Take 1 tablet (10 mg total) by mouth daily. (Patient taking differently: Take 10 mg by mouth in the morning.) 30 tablet 3   No facility-administered medications prior to visit.    No Known Allergies  Review of  Systems  Constitutional:  Positive for appetite change.       Due tooth decay tooth   HENT:  Positive for dental problem.   Gastrointestinal:  Positive for nausea.  Skin:  Positive for color change and rash.  All other systems reviewed and are negative.     Objective:    Physical Exam Vitals reviewed.  HENT:     Head: Normocephalic.     Right Ear: Tympanic membrane and external ear normal.     Left Ear: Tympanic membrane and external ear normal.     Nose: Nose normal.  Eyes:     Extraocular Movements: Extraocular movements intact.     Pupils: Pupils are equal, round, and reactive to light.  Cardiovascular:     Rate and Rhythm: Normal rate and regular rhythm.  Pulmonary:     Effort: Pulmonary effort is normal.     Breath sounds: Normal breath sounds.  Abdominal:     General: Bowel sounds are normal.     Palpations: Abdomen is soft.  Musculoskeletal:        General: Normal range of motion.     Cervical back: Normal range of motion.  Skin:    General: Skin is warm and dry.  Neurological:     Mental Status: He is alert and oriented to person, place, and time.  Psychiatric:        Mood and Affect: Mood normal.        Behavior: Behavior normal.        Thought Content: Thought content normal.        Judgment: Judgment normal.   BP (!) 157/68 (BP Location: Left Arm, Patient Position: Sitting, Cuff Size: Normal)   Pulse 70   Temp (!) 97.4 F (36.3 C) (Oral)   Resp 16    Ht '5\' 8"'$  (1.727 m)   Wt 160 lb 9.6 oz (72.8 kg)   SpO2 98%   BMI 24.42 kg/m  Wt Readings from Last 3 Encounters:  11/27/20 160 lb 9.6 oz (72.8 kg)  10/09/20 158 lb 3.2 oz (71.8 kg)  09/23/20 160 lb 0.9 oz (72.6 kg)    Health Maintenance Due  Topic Date Due   COVID-19 Vaccine (1) Never done   Zoster Vaccines- Shingrix (1 of 2) Never done   Fecal DNA (Cologuard)  Never done   PNA vac Low Risk Adult (1 of 2 - PCV13) 06/07/2019    There are no preventive care reminders to display for this patient.   Lab Results  Component Value Date   TSH 2.630 07/18/2019   Lab Results  Component Value Date   WBC 13.1 (H) 07/24/2020   HGB 8.5 (L) 09/23/2020   HCT 25.0 (L) 09/23/2020   MCV 101.9 (H) 07/24/2020   PLT 308 07/24/2020   Lab Results  Component Value Date   NA 137 09/23/2020   K 6.1 (H) 09/23/2020   CO2 21 (L) 07/01/2020   GLUCOSE 87 09/23/2020   BUN 67 (H) 09/23/2020   CREATININE 10.20 (H) 09/23/2020   BILITOT 0.7 06/22/2020   ALKPHOS 63 06/22/2020   AST 49 (H) 06/22/2020   ALT 24 06/22/2020   PROT 4.7 (L) 06/22/2020   ALBUMIN 1.5 (L) 07/01/2020   CALCIUM 8.0 (L) 07/01/2020   ANIONGAP 16 (H) 07/01/2020   GFR 58.30 (L) 01/19/2017   Lab Results  Component Value Date   CHOL 154 11/27/2020   Lab Results  Component Value Date   HDL 53 11/27/2020   Lab  Results  Component Value Date   LDLCALC 89 11/27/2020   Lab Results  Component Value Date   TRIG 58 11/27/2020   Lab Results  Component Value Date   CHOLHDL 2.9 11/27/2020   Lab Results  Component Value Date   HGBA1C 5.4 11/10/2017       Assessment & Plan:   Diagnoses and all orders for this visit:  ESRD (end stage renal disease) ([place]) ESRD on dialysis ([place]) Peripheral artery disease ([place]  Essential hypertension Counseled on blood pressure goal of less than 130/80, low-sodium, DASH diet, medication compliance, 150 minutes of moderate intensity exercise per week. Discussed medication compliance,  adverse effects.   Hyperlipidemia, unspecified hyperlipidemia type Decrease your fatty foods, red meat, cheese, milk and increase fiber like whole grains and veggies.  -     Lipid Panel  Tooth abscess -     amoxicillin-clavulanate (AUGMENTIN) 875-125 MG tablet; Take 1 tablet by mouth 2 (two) times daily. -     ondansetron (ZOFRAN ODT) 4 MG disintegrating tablet; Take 1 tablet (4 mg total) by mouth every 8 (eight) hours as needed for nausea or vomiting.  Colon cancer screening -     Ambulatory referral to Gastroenterology  Nausea without vomiting Probably due to infection from poor dental carries  ondansetron (ZOFRAN ODT) 4 MG disintegrating   Need for prophylactic vaccination against Streptococcus pneumoniae (pneumococcus)  Need for pneumococcal vaccine -     Pneumococcal conjugate vaccine 20-valent (Prevnar 20)  Other orders/Medication  -     amLODipine (NORVASC) 10 MG tablet; Take 1 tablet (10 mg total) by mouth in the morning.    Meds ordered this encounter  Medications   amLODipine (NORVASC) 10 MG tablet    Sig: Take 1 tablet (10 mg total) by mouth in the morning.    Dispense:  90 tablet    Refill:  1   amoxicillin-clavulanate (AUGMENTIN) 875-125 MG tablet    Sig: Take 1 tablet by mouth 2 (two) times daily.    Dispense:  20 tablet    Refill:  0   ondansetron (ZOFRAN ODT) 4 MG disintegrating tablet    Sig: Take 1 tablet (4 mg total) by mouth every 8 (eight) hours as needed for nausea or vomiting.    Dispense:  20 tablet    Refill:  0     Kerin Perna, NP

## 2020-11-28 LAB — LIPID PANEL
Chol/HDL Ratio: 2.9 ratio (ref 0.0–5.0)
Cholesterol, Total: 154 mg/dL (ref 100–199)
HDL: 53 mg/dL (ref >39)
LDL Chol Calc (NIH): 89 mg/dL (ref 0–99)
Triglycerides: 58 mg/dL (ref 0–149)
VLDL Cholesterol Cal: 12 mg/dL (ref 5–40)

## 2020-12-05 ENCOUNTER — Emergency Department (HOSPITAL_COMMUNITY): Payer: Medicare Other

## 2020-12-05 ENCOUNTER — Encounter (HOSPITAL_COMMUNITY): Payer: Self-pay | Admitting: Emergency Medicine

## 2020-12-05 ENCOUNTER — Emergency Department (HOSPITAL_COMMUNITY)
Admission: EM | Admit: 2020-12-05 | Discharge: 2020-12-05 | Disposition: A | Discharge status: Home / Self Care | Payer: Medicare Other | Attending: Emergency Medicine | Admitting: Emergency Medicine

## 2020-12-05 DIAGNOSIS — Z992 Dependence on renal dialysis: Secondary | ICD-10-CM | POA: Diagnosis not present

## 2020-12-05 DIAGNOSIS — Z7982 Long term (current) use of aspirin: Secondary | ICD-10-CM | POA: Insufficient documentation

## 2020-12-05 DIAGNOSIS — J449 Chronic obstructive pulmonary disease, unspecified: Secondary | ICD-10-CM | POA: Diagnosis not present

## 2020-12-05 DIAGNOSIS — R1084 Generalized abdominal pain: Secondary | ICD-10-CM | POA: Insufficient documentation

## 2020-12-05 DIAGNOSIS — R197 Diarrhea, unspecified: Secondary | ICD-10-CM | POA: Diagnosis not present

## 2020-12-05 DIAGNOSIS — Z8616 Personal history of COVID-19: Secondary | ICD-10-CM | POA: Diagnosis not present

## 2020-12-05 DIAGNOSIS — I132 Hypertensive heart and chronic kidney disease with heart failure and with stage 5 chronic kidney disease, or end stage renal disease: Secondary | ICD-10-CM | POA: Diagnosis not present

## 2020-12-05 DIAGNOSIS — Z79899 Other long term (current) drug therapy: Secondary | ICD-10-CM | POA: Insufficient documentation

## 2020-12-05 DIAGNOSIS — I5032 Chronic diastolic (congestive) heart failure: Secondary | ICD-10-CM | POA: Diagnosis not present

## 2020-12-05 DIAGNOSIS — J45909 Unspecified asthma, uncomplicated: Secondary | ICD-10-CM | POA: Diagnosis not present

## 2020-12-05 DIAGNOSIS — R109 Unspecified abdominal pain: Secondary | ICD-10-CM

## 2020-12-05 DIAGNOSIS — N186 End stage renal disease: Secondary | ICD-10-CM | POA: Insufficient documentation

## 2020-12-05 DIAGNOSIS — Z87891 Personal history of nicotine dependence: Secondary | ICD-10-CM | POA: Insufficient documentation

## 2020-12-05 LAB — CBC WITH DIFFERENTIAL/PLATELET
Abs Immature Granulocytes: 0.04 10*3/uL (ref 0.00–0.07)
Basophils Absolute: 0.1 10*3/uL (ref 0.0–0.1)
Basophils Relative: 2 %
Eosinophils Absolute: 0.4 10*3/uL (ref 0.0–0.5)
Eosinophils Relative: 5 %
HCT: 32.9 % — ABNORMAL LOW (ref 39.0–52.0)
Hemoglobin: 10 g/dL — ABNORMAL LOW (ref 13.0–17.0)
Immature Granulocytes: 1 %
Lymphocytes Relative: 14 %
Lymphs Abs: 1 10*3/uL (ref 0.7–4.0)
MCH: 29.9 pg (ref 26.0–34.0)
MCHC: 30.4 g/dL (ref 30.0–36.0)
MCV: 98.2 fL (ref 80.0–100.0)
Monocytes Absolute: 0.4 10*3/uL (ref 0.1–1.0)
Monocytes Relative: 6 %
Neutro Abs: 5.3 10*3/uL (ref 1.7–7.7)
Neutrophils Relative %: 72 %
Platelets: 174 10*3/uL (ref 150–400)
RBC: 3.35 MIL/uL — ABNORMAL LOW (ref 4.22–5.81)
RDW: 16.6 % — ABNORMAL HIGH (ref 11.5–15.5)
WBC: 7.3 10*3/uL (ref 4.0–10.5)
nRBC: 0 % (ref 0.0–0.2)

## 2020-12-05 LAB — URINALYSIS, ROUTINE W REFLEX MICROSCOPIC
Bilirubin Urine: NEGATIVE
Glucose, UA: 150 mg/dL — AB
Hgb urine dipstick: NEGATIVE
Ketones, ur: 5 mg/dL — AB
Leukocytes,Ua: NEGATIVE
Nitrite: NEGATIVE
Protein, ur: >=300 mg/dL — AB
Specific Gravity, Urine: 1.016 (ref 1.005–1.030)
pH: 7 (ref 5.0–8.0)

## 2020-12-05 LAB — COMPREHENSIVE METABOLIC PANEL
ALT: 12 U/L (ref 0–44)
AST: 9 U/L — ABNORMAL LOW (ref 15–41)
Albumin: 2.8 g/dL — ABNORMAL LOW (ref 3.5–5.0)
Alkaline Phosphatase: 55 U/L (ref 38–126)
Anion gap: 19 — ABNORMAL HIGH (ref 5–15)
BUN: 78 mg/dL — ABNORMAL HIGH (ref 8–23)
CO2: 15 mmol/L — ABNORMAL LOW (ref 22–32)
Calcium: 8.2 mg/dL — ABNORMAL LOW (ref 8.9–10.3)
Chloride: 108 mmol/L (ref 98–111)
Creatinine, Ser: 18 mg/dL — ABNORMAL HIGH (ref 0.61–1.24)
GFR, Estimated: 3 mL/min — ABNORMAL LOW (ref >60)
Glucose, Bld: 89 mg/dL (ref 70–99)
Potassium: 4.9 mmol/L (ref 3.5–5.1)
Sodium: 142 mmol/L (ref 135–145)
Total Bilirubin: 0.8 mg/dL (ref 0.3–1.2)
Total Protein: 5.6 g/dL — ABNORMAL LOW (ref 6.5–8.1)

## 2020-12-05 LAB — I-STAT CHEM 8, ED
BUN: 77 mg/dL — ABNORMAL HIGH (ref 8–23)
Calcium, Ion: 0.96 mmol/L — ABNORMAL LOW (ref 1.15–1.40)
Chloride: 110 mmol/L (ref 98–111)
Creatinine, Ser: >18 mg/dL — ABNORMAL HIGH (ref 0.61–1.24)
Glucose, Bld: 84 mg/dL (ref 70–99)
HCT: 30 % — ABNORMAL LOW (ref 39.0–52.0)
Hemoglobin: 10.2 g/dL — ABNORMAL LOW (ref 13.0–17.0)
Potassium: 4.9 mmol/L (ref 3.5–5.1)
Sodium: 139 mmol/L (ref 135–145)
TCO2: 16 mmol/L — ABNORMAL LOW (ref 22–32)

## 2020-12-05 LAB — LIPASE, BLOOD: Lipase: 38 U/L (ref 11–51)

## 2020-12-05 MED ORDER — IOHEXOL 350 MG/ML SOLN
80.0000 mL | Freq: Once | INTRAVENOUS | Status: AC | PRN
  Administered 2020-12-05: 80 mL via INTRAVENOUS

## 2020-12-05 MED ORDER — METOPROLOL TARTRATE 25 MG PO TABS
25.0000 mg | ORAL_TABLET | Freq: Once | ORAL | Status: AC
  Administered 2020-12-05: 25 mg via ORAL
  Filled 2020-12-05: qty 1

## 2020-12-05 NOTE — ED Notes (Signed)
Pt in room with visitor at bedside. Updated on plan of care. Pt receptive to information. Denies any other needs at this time.

## 2020-12-05 NOTE — ED Provider Notes (Signed)
[facility] EMERGENCY DEPARTMENT Provider Note   CSN: [id number] Arrival date & time: 12/05/20  1304     History Chief Complaint  Patient presents with   Abdominal Pain    Albert Harris is a 65 y.o. male.  65yo M w/ extensive PMH including ESRD on HD, COPD, CVA, PVD who p/w abdominal pain. Pt reports 1 week of generalized, intermittent, dull abdominal pain w/ distension. He denies any associated nausea or vomiting. He has had alternating diarrhea and constipation, no episodes of diarrhea today. He occasionally has a small amount of blood in stool but it is not constant. He has missed dialysis for a week due to this pain and having diarrhea. He has been on augmentin, 1 dose left, for tooth pain. He denies any chest pain, fevers, or sick contacts. Slight shortness of breath but not bad. No therapies tried PTA.   The history is provided by the patient.  Abdominal Pain     Past Medical History:  Diagnosis Date   Anemia    Asthma    Carotid stenosis 09/12/2019   Chronic kidney disease    t, th. sat dialysis   Claudication in peripheral vascular disease ([place]) [id number]   Complication of anesthesia    " i HAVE A HARD TIME WAKING UP " (only one time)   COPD (chronic obstructive pulmonary disease) ([place])    COVID-19    positive on  06/16/20   GERD (gastroesophageal reflux disease)    HLD (hyperlipidemia)    Hypertension    Peripheral vascular disease (HCC)    PFO with atrial septal aneurysm    by echo 11/2017   Pneumonia    Stroke [facility])    [name] (transient ischemic attack) 11/2017   Tobacco abuse    quit 07/17/19    Patient Active Problem List   Diagnosis Date Noted   ESRD (end stage renal disease) ([place])    Lower GI bleed    Pressure injury of skin 06/28/2020   Acute renal failure superimposed on stage 5 chronic kidney disease, not on chronic dialysis ([place])    Acute respiratory disease due to COVID-19 virus 06/16/2020   CKD (chronic kidney disease) stage 5, GFR  less than 15 ml/min ([place]) 03/19/2020   Claudication in peripheral vascular disease ([place]) 09/12/2019   Carotid stenosis 09/12/2019   PFO (patent foramen ovale) 08/10/2019   Noncompliance 08/10/2019   Hx of transient ischemic attack ([name]) 08/10/2019   Chronic diastolic heart failure (HCC)    Elevated troponin    Hypertensive emergency    Acute respiratory failure with hypoxia ([place]) 07/18/2019   Acute pulmonary edema ([place]) 07/18/2019   ARF (acute renal failure) ([place]) 07/18/2019   Anemia 07/18/2019   Right sided weakness 11/09/2017   CKD (chronic kidney disease), stage III ([place]) 11/09/2017   Tobacco abuse 01/19/2017   Hypertensive urgency 12/24/2016   [name] (transient ischemic attack) 12/24/2016    Past Surgical History:  Procedure Laterality Date   ABDOMINAL AORTOGRAM W/LOWER EXTREMITY N/A 06/21/2020   Procedure: ABDOMINAL AORTOGRAM W/LOWER EXTREMITY;  Surgeon: Angelia Mould, MD;  Location: [place] [facility];  Service: Cardiovascular;  Laterality: N/A;   AV FISTULA PLACEMENT Left 03/28/2020   Procedure: ARTERIOVENOUS (AV) FISTULA CREATION LEFT;  Surgeon: [name], MD;  Location: [place];  Service: Vascular;  Laterality: Left;   AV FISTULA PLACEMENT Right 09/23/2020   Procedure: RIGHT Arm ARTERIOVENOUS GRAFT CREATION.;  Surgeon: Angelia Mould, MD;  Location: [place];  Service: Vascular;  Laterality: Right;   COLONOSCOPY     COLONOSCOPY WITH PROPOFOL N/A 06/27/2020   Procedure: COLONOSCOPY WITH PROPOFOL;  Surgeon: Carol Ada, MD;  Location: [place];  Service: Endoscopy;  Laterality: N/A;   FINGER SURGERY     HEMOSTASIS CLIP PLACEMENT  06/27/2020   Procedure: HEMOSTASIS CLIP PLACEMENT;  Surgeon: Carol Ada, MD;  Location: [place];  Service: Endoscopy;;   INSERTION OF DIALYSIS CATHETER Right 06/24/2020   Procedure: INSERTION OF TUNNELED  DIALYSIS CATHETER; Removal of temporary dialysis catheter right neck;  Surgeon: Angelia Mould, MD;   Location: [place];  Service: Vascular;  Laterality: Right;   LIGATION OF ARTERIOVENOUS  FISTULA Left 07/24/2020   Procedure: LIGATION OF LEFT BRACHIOCEPHALIC ARTERIOVENOUS  FISTULA;  Surgeon: [name], MD;  Location: [place];  Service: Vascular;  Laterality: Left;   PERIPHERAL VASCULAR INTERVENTION Right 06/21/2020   Procedure: PERIPHERAL VASCULAR INTERVENTION;  Surgeon: Angelia Mould, MD;  Location: [place] [facility];  Service: Cardiovascular;  Laterality: Right;       Family History  Problem Relation Age of Onset   Hypertension Mother    Colon cancer Father    Stomach cancer Brother     Social History   Tobacco Use   Smoking status: Former    Packs/day: 1.00    Years: 50.00    Pack years: 50.00    Types: Cigarettes    Quit date: 07/17/2019    Years since quitting: 1.3   Smokeless tobacco: Never  Vaping Use   Vaping Use: Never used  Substance Use Topics   Alcohol use: Not Currently    Alcohol/week: 1.0 - 3.0 standard drink    Types: 1 - 3 Cans of beer per week   Drug use: No    Home Medications Prior to Admission medications   Medication Sig Start Date End Date Taking? Authorizing Provider  albuterol (VENTOLIN HFA) 108 (90 Base) MCG/ACT inhaler INHALE 1-2 PUFFS INTO THE LUNGS EVERY 6 (SIX) HOURS AS NEEDED FOR WHEEZING OR SHORTNESS OF BREATH. 07/02/20 07/02/21  Maness, Arnette Norris, MD  amLODipine (NORVASC) 10 MG tablet Take 1 tablet (10 mg total) by mouth in the morning. 11/27/20   Kerin Perna, NP  amoxicillin-clavulanate (AUGMENTIN) 875-125 MG tablet Take 1 tablet by mouth 2 (two) times daily. 11/27/20   Kerin Perna, NP  aspirin 81 MG EC tablet TAKE 1 TABLET (81 MG TOTAL) BY MOUTH DAILY. SWALLOW WHOLE. Patient taking differently: Take by mouth 5 (five) times daily. SWALLOW WHOLE. 07/02/20 07/02/21  Maness, Arnette Norris, MD  atorvastatin (LIPITOR) 40 MG tablet TAKE 1 TABLET (40 MG TOTAL) BY MOUTH DAILY AT 6 PM. Patient taking differently: Take by mouth daily.  at 6pm 07/02/20 07/02/21  Maness, Arnette Norris, MD  calcium acetate (PHOSLO) 667 MG capsule TAKE 2 CAPSULES BY MOUTH THREE TIMES DAILY WITH MEALS Patient taking differently: Take 667-1,334 mg by mouth 3 (three) times daily with meals. Take 2 capsules (1334 mg) by mouth with each meal & take 1 capsule (667 mg) by  mouth with each snack 08/13/20 08/13/21  Corliss Parish, MD  doxazosin (CARDURA) 4 MG tablet TAKE 1.5 TABLETS (6 MG TOTAL) BY MOUTH DAILY. Patient not taking: No sig reported 09/22/19 09/21/20  Skeet Latch, MD  furosemide (LASIX) 40 MG tablet TAKE 1 TABLET BY MOUTH TWICE A DAY, AT 8AM AND 2PM 04/23/20 04/23/21  Elmarie Shiley, MD  gabapentin (NEURONTIN) 300 MG capsule TAKE 1 CAPSULE (300 MG TOTAL) BY MOUTH 3 (THREE) TIMES  DAILY. 07/31/20 07/31/21  Angelia Mould, MD  hydrOXYzine (ATARAX/VISTARIL) 50 MG tablet Take 1 tablet by mouth four times a day as needed prn for itching on dialysis days 11/19/20     metoprolol tartrate (LOPRESSOR) 25 MG tablet TAKE 1 TABLET BY MOUTH TWICE A DAY Patient taking differently: Take 25 mg by mouth 2 (two) times daily. 07/25/20 07/25/21  Corliss Parish, MD  nitroGLYCERIN (NITROSTAT) 0.4 MG SL tablet Place 1 tablet (0.4 mg total) under the tongue every 5 (five) minutes x 3 doses as needed for chest pain. 07/23/19   Aline August, MD  ondansetron (ZOFRAN ODT) 4 MG disintegrating tablet Take 1 tablet (4 mg total) by mouth every 8 (eight) hours as needed for nausea or vomiting. 11/27/20   Kerin Perna, NP  oxyCODONE-acetaminophen (PERCOCET) 7.5-325 MG tablet Take 1 tablet by mouth every 4 (four) hours as needed for severe pain. 09/23/20 09/23/21  Setzer, Edman Circle, PA-C  sodium bicarbonate 650 MG tablet Take 650 mg by mouth daily as needed for heartburn. 02/29/20   [provider]    Allergies    Patient has no known allergies.  Review of Systems   Review of Systems  Gastrointestinal:  Positive for abdominal pain.  All other systems reviewed and  are negative except that which was mentioned in HPI  Physical Exam Updated Vital Signs BP (!) 195/93 (BP Location: Left Arm)   Pulse 69   Temp 97.6 F (36.4 C) (Oral)   Resp 17   SpO2 97%   Physical Exam Vitals and nursing note reviewed.  Constitutional:      General: He is not in acute distress.    Appearance: Normal appearance. He is well-developed.  HENT:     Head: Normocephalic and atraumatic.  Eyes:     Conjunctiva/sclera: Conjunctivae normal.  Cardiovascular:     Rate and Rhythm: Normal rate and regular rhythm.     Heart sounds: Normal heart sounds. No murmur heard. Pulmonary:     Effort: Pulmonary effort is normal.     Breath sounds: Normal breath sounds.  Abdominal:     General: Abdomen is flat. Bowel sounds are normal. There is distension (mild).     Palpations: Abdomen is soft.     Tenderness: There is generalized abdominal tenderness. There is no guarding or rebound.  Musculoskeletal:     Right lower leg: No edema.     Left lower leg: No edema.  Skin:    General: Skin is warm and dry.  Neurological:     Mental Status: He is alert and oriented to person, place, and time.     Comments: fluent  Psychiatric:        Mood and Affect: Mood normal.        Behavior: Behavior normal.    ED Results / Procedures / Treatments   Labs (all labs ordered are listed, but only abnormal results are displayed) Labs Reviewed  COMPREHENSIVE METABOLIC PANEL - Abnormal; Notable for the following components:      Result Value   CO2 15 (*)    BUN 78 (*)    Creatinine, Ser 18.00 (*)    Calcium 8.2 (*)    Total Protein 5.6 (*)    Albumin 2.8 (*)    AST 9 (*)    GFR, Estimated 3 (*)    Anion gap 19 (*)    All other components within normal limits  URINALYSIS, ROUTINE W REFLEX MICROSCOPIC - Abnormal; Notable for the following components:  Glucose, UA 150 (*)    Ketones, ur 5 (*)    Protein, ur >=300 (*)    Bacteria, UA RARE (*)    All other components within normal limits   CBC WITH DIFFERENTIAL/PLATELET - Abnormal; Notable for the following components:   RBC 3.35 (*)    Hemoglobin 10.0 (*)    HCT 32.9 (*)    RDW 16.6 (*)    All other components within normal limits  I-STAT CHEM 8, ED - Abnormal; Notable for the following components:   BUN 77 (*)    Creatinine, Ser >18.00 (*)    Calcium, Ion 0.96 (*)    TCO2 16 (*)    Hemoglobin 10.2 (*)    HCT 30.0 (*)    All other components within normal limits  LIPASE, BLOOD    EKG EKG Interpretation  Date/Time:  Thursday December 05 2020 13:35:55 EDT Ventricular Rate:  69 PR Interval:  158 QRS Duration: 80 QT Interval:  470 QTC Calculation: 503 R Axis:   -15 Text Interpretation: Normal sinus rhythm Possible Left atrial enlargement Minimal voltage criteria for LVH, may be normal variant ( Sokolow-Lyon ) Anterior infarct , age undetermined Prolonged QT Abnormal ECG rate slower, peaked T waves improved, prolonged QT new Confirmed by Theotis Burrow [phone]) on 12/05/2020 3:07:48 PM  Radiology CT Angio Abd/Pel W and/or Wo Contrast  Result Date: 12/05/2020 CLINICAL DATA:  65 year old male with acute intermittent abdominal pain. Concern for mesenteric ischemia. EXAM: CTA ABDOMEN AND PELVIS WITHOUT AND WITH CONTRAST TECHNIQUE: Multidetector CT imaging of the abdomen and pelvis was performed using the standard protocol during bolus administration of intravenous contrast. Multiplanar reconstructed images and MIPs were obtained and reviewed to evaluate the vascular anatomy. CONTRAST:  48m OMNIPAQUE IOHEXOL 350 MG/ML SOLN COMPARISON:  CT dated 06/18/2020. FINDINGS: VASCULAR Aorta: Advanced calcified and noncalcified plaque. No aneurysmal dilatation or dissection. No periaortic fluid collection or hematoma. Celiac: There is atherosclerotic calcification of the origin of the celiac axis. The celiac artery and its major branches remain patent. SMA: There is intraluminal calcified and noncalcified plaque spanning approximately 3 cm  length of the proximal SMA. There is associated high-grade luminal narrowing. Similar findings were seen on the prior CT. The SMA however remains patent. Renals: There is atherosclerotic calcification of the origin the renal arteries. There is advanced diffuse plaque along the renal arteries bilaterally. There is diminished flow within the renal arteries. IMA: Atherosclerotic calcification of the origin of the IMA. The IMA remains patent. Inflow: There is advanced atherosclerotic calcification the iliac arteries. Vascular stents noted in the common iliac arteries bilaterally. The stents appear patent. There is overall luminal narrowing of the external iliac arteries due to heavy plaque. The external iliac arteries remain patent. The internal iliac arteries are calcified and occluded. Proximal Outflow: There is occlusion of the visualized proximal right superficial femoral artery. Veins: The IVC is unremarkable. No portal venous gas. Review of the MIP images confirms the above findings. NON-VASCULAR Lower chest: Partially visualized small bilateral pleural effusions with associated partial compressive atelectasis of the lower lobes. Pneumonia is not excluded clinical correlation is recommended. Overall interval increase in the pleural effusion compared to the prior CT. There is mild cardiomegaly. No intra-abdominal free air. Small perihepatic free fluid as well as small amount of fluid in the posterior pelvis and diffuse mesenteric edema. Hepatobiliary: The liver is unremarkable. No intrahepatic biliary dilatation. No calcified gallstone. There is pericholecystic edema and stranding, likely related to generalized mesenteric edema. Ultrasound may  provide better evaluation of the gallbladder if there is high clinical concern for acute cholecystitis. Pancreas: Unremarkable. No pancreatic ductal dilatation or surrounding inflammatory changes. Spleen: Normal in size without focal abnormality. Adrenals/Urinary Tract: The  adrenal glands are unremarkable. Moderate bilateral renal parenchyma atrophy. There is no hydronephrosis on either side. There is renal vascular calcification. The visualized ureters and urinary bladder appear unremarkable. Stomach/Bowel: There is sigmoid diverticulosis without active inflammatory changes. Mild thickened appearance of several loops of small bowel as well as thickened appearance of the rectosigmoid which may be related to diffuse mesenteric edema and anasarca. Enteritis or colitis is not excluded clinical correlation recommended. No pneumatosis. The appendix is normal. Lymphatic: No adenopathy. Reproductive: The prostate and seminal vesicles are grossly unremarkable. No pelvic mass. Other: Diffuse mesenteric and subcutaneous edema and anasarca. Musculoskeletal: No acute or significant osseous findings. IMPRESSION: 1. No CT evidence mesenteric ischemia. 2. Thickened appearance of loops of small bowel and rectosigmoid may be related to generalized edema and anasarca. Enteritis or enterocolitis is not excluded. Clinical correlation is recommended. 3. Extensive vascular calcification and atherosclerotic plaque as above and similar to prior CT. 4. Small bilateral pleural effusions, increased since the prior CT. There is associated partial compressive atelectasis of the lower lobes versus pneumonia. 5. Sigmoid diverticulosis. 6. Aortic Atherosclerosis (ICD10-I70.0). Electronically Signed   By: Anner Crete M.D.   On: 12/05/2020 21:46    Procedures Procedures   Medications Ordered in ED Medications  iohexol (OMNIPAQUE) 350 MG/ML injection 80 mL (80 mLs Intravenous Contrast Given 12/05/20 2111)  metoprolol tartrate (LOPRESSOR) tablet 25 mg (25 mg Oral Given 12/05/20 2241)    ED Course  I have reviewed the triage vital signs and the nursing notes.  Pertinent labs & imaging results that were available during my care of the patient were reviewed by me and considered in my medical decision making  (see chart for details).    MDM Rules/Calculators/A&P                          Non-toxic on exam, stable VS. Normal sats on RA, K 4.9, no evidence of respiratory compromise from volume overload. Doesn't need emergent dialysis. Regarding abd pain, ddx includes mesenteric ischemia, diverticulitis, pancreatitis. Labs show normal LFTs and lipase, normal WBC count.  UA without infection.  Obtained CTA abd/pelvis given known vascular disease.  Received permission from nephrology, Dr. Candiss Norse, to administer IV contrast.  CT shows no evidence of mesenteric ischemia.  He does have some thickened loops of small bowel and rectosigmoid colon.  This could be due to volume overload or consistent with his report of diarrhea.  He has been on Augmentin and this may be contributing to his symptoms.  CT overall reassuring against acute life-threatening process.  Patient is well-appearing on reassessment.  He has been hypertensive here but no complaints to suggest hypertensive emergency.  I have counseled on supportive measures for diarrhea and instructed him to call dialysis center to schedule session as soon as possible.  Reviewed return precautions with him and he voiced understanding. Final Clinical Impression(s) / ED Diagnoses Final diagnoses:  Abdominal pain, unspecified abdominal location  Diarrhea, unspecified type    Rx / DC Orders ED Discharge Orders     None        Jatoya Armbrister, Wenda Overland, MD 12/05/20 [phone]

## 2020-12-05 NOTE — ED Triage Notes (Signed)
Patient here for evaluation of abdominal pain and distention over the last week. Patient is on T,Th,Sat dialysis, states he has not gone to his treatments over the last several days due to the abdominal pain. Patient alert, oriented, and in no apparent distress.

## 2020-12-05 NOTE — ED Notes (Signed)
Pt in CT.

## 2020-12-05 NOTE — ED Provider Notes (Signed)
Emergency Medicine Provider Triage Evaluation Note  Albert Harris , a 65 y.o. male  was evaluated in triage.  Pt complains of about abdominal pain.  Patient reports that pain has been present over the last week.  Patient is progressively worsened over this time.  Patient endorses abdominal distention, nausea and vomiting.  Patient is unsure when his last normal bowel movement was.  Patient reports that he has seen small amounts of bright red blood in his stool for the last week.  No melena or hematochezia.  Patient to dialysis Tuesday, Thursday, and Saturday.  Patient's last dialysis appointment was on 6/25.  Has not been going due to his abdominal pain.  No history of abdominal surgeries.  Review of Systems  Positive: Abdominal pain, nausea, diarrhea, blood in stool Negative: Fever, chills, constipation, vomiting  Physical Exam  BP (!) 161/81 (BP Location: Left Arm)   Pulse 68   Temp 97.7 F (36.5 C) (Oral)   Resp 16   SpO2 95%  Gen:   Awake, no distress   Resp:  Normal effort  MSK:   Moves extremities without difficulty  Other:  Abdomen soft, distended, generalized tenderness, no guarding or rebound tenderness.  Medical Decision Making  Medically screening exam initiated at 1:50 PM.  Appropriate orders placed.  ASHAR CARMEL was informed that the remainder of the evaluation will be completed by another provider, this initial triage assessment does not replace that evaluation, and the importance of remaining in the ED until their evaluation is complete.  The patient appears stable so that the remainder of the work up may be completed by another provider.      Loni Beckwith, PA-C 12/05/20 1352    Dorie Rank, MD 12/06/20 [phone]

## 2020-12-05 NOTE — Discharge Instructions (Addendum)
CALL YOUR DIALYSIS CENTER TO SCHEDULE A SESSION TOMORROW.

## 2020-12-12 ENCOUNTER — Emergency Department (HOSPITAL_COMMUNITY)
Admission: EM | Admit: 2020-12-12 | Discharge: 2020-12-12 | Disposition: A | Discharge status: Home / Self Care | Payer: Medicare Other | Source: Home / Self Care

## 2020-12-12 ENCOUNTER — Encounter (HOSPITAL_COMMUNITY): Payer: Self-pay | Admitting: Emergency Medicine

## 2020-12-12 ENCOUNTER — Emergency Department (HOSPITAL_COMMUNITY): Payer: Medicare Other

## 2020-12-12 DIAGNOSIS — N492 Inflammatory disorders of scrotum: Secondary | ICD-10-CM | POA: Insufficient documentation

## 2020-12-12 DIAGNOSIS — Z5321 Procedure and treatment not carried out due to patient leaving prior to being seen by health care provider: Secondary | ICD-10-CM | POA: Insufficient documentation

## 2020-12-12 DIAGNOSIS — I132 Hypertensive heart and chronic kidney disease with heart failure and with stage 5 chronic kidney disease, or end stage renal disease: Secondary | ICD-10-CM | POA: Diagnosis not present

## 2020-12-12 DIAGNOSIS — R0602 Shortness of breath: Secondary | ICD-10-CM | POA: Diagnosis not present

## 2020-12-12 LAB — COMPREHENSIVE METABOLIC PANEL
ALT: 10 U/L (ref 0–44)
AST: 11 U/L — ABNORMAL LOW (ref 15–41)
Albumin: 2.8 g/dL — ABNORMAL LOW (ref 3.5–5.0)
Alkaline Phosphatase: 62 U/L (ref 38–126)
Anion gap: 23 — ABNORMAL HIGH (ref 5–15)
BUN: 97 mg/dL — ABNORMAL HIGH (ref 8–23)
CO2: 11 mmol/L — ABNORMAL LOW (ref 22–32)
Calcium: 7.9 mg/dL — ABNORMAL LOW (ref 8.9–10.3)
Chloride: 108 mmol/L (ref 98–111)
Creatinine, Ser: 20.09 mg/dL — ABNORMAL HIGH (ref 0.61–1.24)
GFR, Estimated: 2 mL/min — ABNORMAL LOW (ref >60)
Glucose, Bld: 91 mg/dL (ref 70–99)
Potassium: 4.4 mmol/L (ref 3.5–5.1)
Sodium: 142 mmol/L (ref 135–145)
Total Bilirubin: 1 mg/dL (ref 0.3–1.2)
Total Protein: 5.5 g/dL — ABNORMAL LOW (ref 6.5–8.1)

## 2020-12-12 LAB — CBC WITH DIFFERENTIAL/PLATELET
Abs Immature Granulocytes: 0.02 10*3/uL (ref 0.00–0.07)
Basophils Absolute: 0.1 10*3/uL (ref 0.0–0.1)
Basophils Relative: 1 %
Eosinophils Absolute: 0.4 10*3/uL (ref 0.0–0.5)
Eosinophils Relative: 6 %
HCT: 30.2 % — ABNORMAL LOW (ref 39.0–52.0)
Hemoglobin: 9.3 g/dL — ABNORMAL LOW (ref 13.0–17.0)
Immature Granulocytes: 0 %
Lymphocytes Relative: 13 %
Lymphs Abs: 0.9 10*3/uL (ref 0.7–4.0)
MCH: 29.8 pg (ref 26.0–34.0)
MCHC: 30.8 g/dL (ref 30.0–36.0)
MCV: 96.8 fL (ref 80.0–100.0)
Monocytes Absolute: 0.4 10*3/uL (ref 0.1–1.0)
Monocytes Relative: 7 %
Neutro Abs: 4.7 10*3/uL (ref 1.7–7.7)
Neutrophils Relative %: 73 %
Platelets: 156 10*3/uL (ref 150–400)
RBC: 3.12 MIL/uL — ABNORMAL LOW (ref 4.22–5.81)
RDW: 16.5 % — ABNORMAL HIGH (ref 11.5–15.5)
WBC: 6.5 10*3/uL (ref 4.0–10.5)
nRBC: 0 % (ref 0.0–0.2)

## 2020-12-12 NOTE — ED Notes (Signed)
Pt left AMA due to wait time  

## 2020-12-12 NOTE — ED Provider Notes (Signed)
Emergency Medicine Provider Triage Evaluation Note  Albert Harris , a 65 y.o. male  was evaluated in triage.  Pt complains of Testicular swelling.  Started a week ago and is progressively worsening.  Uncomfortable walking.  No hematuria, decreased stream with urination.  Review of Systems  Positive: Testicular swelling decreased stream Negative: Fevers, chills, hematuria  Physical Exam  BP (!) 196/96   Pulse 88   Temp 97.8 F (36.6 C) (Oral)   Resp 18   SpO2 100%  Gen:   Awake, no distress   Resp:  Normal effort  MSK:   Moves extremities without difficulty  Other:    Medical Decision Making  Medically screening exam initiated at 10:11 AM.  Appropriate orders placed.  Albert Harris was informed that the remainder of the evaluation will be completed by another provider, this initial triage assessment does not replace that evaluation, and the importance of remaining in the ED until their evaluation is complete.     Sherrill Raring, PA-C 12/12/20 1012    Daleen Bo, MD 12/14/20 1356

## 2020-12-12 NOTE — ED Triage Notes (Signed)
Patient complains of scrotum swelling for the last week. Denies swelling elsewhere, states pain is minimal and intermittent. Patient alert, oriented, ambulatory and in no apparent distress.

## 2020-12-14 ENCOUNTER — Inpatient Hospital Stay (HOSPITAL_COMMUNITY)
Admission: EM | Admit: 2020-12-14 | Discharge: 2020-12-19 | DRG: 291 | Disposition: A | Discharge status: Home / Self Care | Payer: Medicare Other | Attending: Internal Medicine | Admitting: Internal Medicine

## 2020-12-14 ENCOUNTER — Observation Stay (HOSPITAL_COMMUNITY): Payer: Medicare Other

## 2020-12-14 ENCOUNTER — Emergency Department (HOSPITAL_COMMUNITY): Payer: Medicare Other

## 2020-12-14 DIAGNOSIS — Z8673 Personal history of transient ischemic attack (TIA), and cerebral infarction without residual deficits: Secondary | ICD-10-CM

## 2020-12-14 DIAGNOSIS — K648 Other hemorrhoids: Secondary | ICD-10-CM | POA: Diagnosis present

## 2020-12-14 DIAGNOSIS — R1900 Intra-abdominal and pelvic swelling, mass and lump, unspecified site: Secondary | ICD-10-CM

## 2020-12-14 DIAGNOSIS — E872 Acidosis: Secondary | ICD-10-CM | POA: Diagnosis present

## 2020-12-14 DIAGNOSIS — N185 Chronic kidney disease, stage 5: Secondary | ICD-10-CM | POA: Diagnosis not present

## 2020-12-14 DIAGNOSIS — N186 End stage renal disease: Secondary | ICD-10-CM | POA: Diagnosis present

## 2020-12-14 DIAGNOSIS — Z87891 Personal history of nicotine dependence: Secondary | ICD-10-CM

## 2020-12-14 DIAGNOSIS — Z7982 Long term (current) use of aspirin: Secondary | ICD-10-CM

## 2020-12-14 DIAGNOSIS — K219 Gastro-esophageal reflux disease without esophagitis: Secondary | ICD-10-CM | POA: Diagnosis present

## 2020-12-14 DIAGNOSIS — E8770 Fluid overload, unspecified: Secondary | ICD-10-CM

## 2020-12-14 DIAGNOSIS — I132 Hypertensive heart and chronic kidney disease with heart failure and with stage 5 chronic kidney disease, or end stage renal disease: Principal | ICD-10-CM | POA: Diagnosis present

## 2020-12-14 DIAGNOSIS — Z9115 Patient's noncompliance with renal dialysis: Secondary | ICD-10-CM

## 2020-12-14 DIAGNOSIS — J449 Chronic obstructive pulmonary disease, unspecified: Secondary | ICD-10-CM | POA: Diagnosis present

## 2020-12-14 DIAGNOSIS — Q211 Atrial septal defect: Secondary | ICD-10-CM

## 2020-12-14 DIAGNOSIS — Z20822 Contact with and (suspected) exposure to covid-19: Secondary | ICD-10-CM | POA: Diagnosis present

## 2020-12-14 DIAGNOSIS — M898X9 Other specified disorders of bone, unspecified site: Secondary | ICD-10-CM | POA: Diagnosis present

## 2020-12-14 DIAGNOSIS — N433 Hydrocele, unspecified: Secondary | ICD-10-CM | POA: Diagnosis present

## 2020-12-14 DIAGNOSIS — E876 Hypokalemia: Secondary | ICD-10-CM | POA: Diagnosis not present

## 2020-12-14 DIAGNOSIS — J9811 Atelectasis: Secondary | ICD-10-CM | POA: Diagnosis present

## 2020-12-14 DIAGNOSIS — E785 Hyperlipidemia, unspecified: Secondary | ICD-10-CM | POA: Diagnosis present

## 2020-12-14 DIAGNOSIS — J431 Panlobular emphysema: Secondary | ICD-10-CM

## 2020-12-14 DIAGNOSIS — Z800 Family history of malignant neoplasm of digestive organs: Secondary | ICD-10-CM

## 2020-12-14 DIAGNOSIS — I10 Essential (primary) hypertension: Secondary | ICD-10-CM

## 2020-12-14 DIAGNOSIS — N2581 Secondary hyperparathyroidism of renal origin: Secondary | ICD-10-CM | POA: Diagnosis present

## 2020-12-14 DIAGNOSIS — R0602 Shortness of breath: Secondary | ICD-10-CM | POA: Diagnosis not present

## 2020-12-14 DIAGNOSIS — I6529 Occlusion and stenosis of unspecified carotid artery: Secondary | ICD-10-CM | POA: Diagnosis present

## 2020-12-14 DIAGNOSIS — Z8249 Family history of ischemic heart disease and other diseases of the circulatory system: Secondary | ICD-10-CM

## 2020-12-14 DIAGNOSIS — Z7189 Other specified counseling: Secondary | ICD-10-CM

## 2020-12-14 DIAGNOSIS — Z8616 Personal history of COVID-19: Secondary | ICD-10-CM

## 2020-12-14 DIAGNOSIS — Z992 Dependence on renal dialysis: Secondary | ICD-10-CM

## 2020-12-14 DIAGNOSIS — K644 Residual hemorrhoidal skin tags: Secondary | ICD-10-CM | POA: Diagnosis present

## 2020-12-14 DIAGNOSIS — I5033 Acute on chronic diastolic (congestive) heart failure: Secondary | ICD-10-CM | POA: Diagnosis not present

## 2020-12-14 DIAGNOSIS — R7989 Other specified abnormal findings of blood chemistry: Secondary | ICD-10-CM | POA: Diagnosis present

## 2020-12-14 DIAGNOSIS — I70201 Unspecified atherosclerosis of native arteries of extremities, right leg: Secondary | ICD-10-CM | POA: Diagnosis present

## 2020-12-14 DIAGNOSIS — D631 Anemia in chronic kidney disease: Secondary | ICD-10-CM | POA: Diagnosis present

## 2020-12-14 DIAGNOSIS — Z79899 Other long term (current) drug therapy: Secondary | ICD-10-CM

## 2020-12-14 DIAGNOSIS — I161 Hypertensive emergency: Secondary | ICD-10-CM

## 2020-12-14 HISTORY — DX: Fluid overload, unspecified: E87.70

## 2020-12-14 LAB — CBC
HCT: 29.5 % — ABNORMAL LOW (ref 39.0–52.0)
Hemoglobin: 9 g/dL — ABNORMAL LOW (ref 13.0–17.0)
MCH: 29.8 pg (ref 26.0–34.0)
MCHC: 30.5 g/dL (ref 30.0–36.0)
MCV: 97.7 fL (ref 80.0–100.0)
Platelets: 135 10*3/uL — ABNORMAL LOW (ref 150–400)
RBC: 3.02 MIL/uL — ABNORMAL LOW (ref 4.22–5.81)
RDW: 16.8 % — ABNORMAL HIGH (ref 11.5–15.5)
WBC: 6.5 10*3/uL (ref 4.0–10.5)
nRBC: 0 % (ref 0.0–0.2)

## 2020-12-14 LAB — COMPREHENSIVE METABOLIC PANEL
ALT: 10 U/L (ref 0–44)
AST: 11 U/L — ABNORMAL LOW (ref 15–41)
Albumin: 2.8 g/dL — ABNORMAL LOW (ref 3.5–5.0)
Alkaline Phosphatase: 65 U/L (ref 38–126)
Anion gap: 20 — ABNORMAL HIGH (ref 5–15)
BUN: 108 mg/dL — ABNORMAL HIGH (ref 8–23)
CO2: 13 mmol/L — ABNORMAL LOW (ref 22–32)
Calcium: 8 mg/dL — ABNORMAL LOW (ref 8.9–10.3)
Chloride: 109 mmol/L (ref 98–111)
Creatinine, Ser: 20.73 mg/dL — ABNORMAL HIGH (ref 0.61–1.24)
GFR, Estimated: 2 mL/min — ABNORMAL LOW (ref >60)
Glucose, Bld: 125 mg/dL — ABNORMAL HIGH (ref 70–99)
Potassium: 4.3 mmol/L (ref 3.5–5.1)
Sodium: 142 mmol/L (ref 135–145)
Total Bilirubin: 0.5 mg/dL (ref 0.3–1.2)
Total Protein: 5.7 g/dL — ABNORMAL LOW (ref 6.5–8.1)

## 2020-12-14 LAB — RESP PANEL BY RT-PCR (FLU A&B, COVID) ARPGX2
Influenza A by PCR: NEGATIVE
Influenza B by PCR: NEGATIVE
SARS Coronavirus 2 by RT PCR: NEGATIVE

## 2020-12-14 LAB — LIPASE, BLOOD: Lipase: 47 U/L (ref 11–51)

## 2020-12-14 LAB — POC OCCULT BLOOD, ED: Fecal Occult Bld: POSITIVE — AB

## 2020-12-14 LAB — BRAIN NATRIURETIC PEPTIDE: B Natriuretic Peptide: >4500 pg/mL — ABNORMAL HIGH (ref 0.0–100.0)

## 2020-12-14 MED ORDER — ACETAMINOPHEN 325 MG PO TABS: 650.0000 mg | ORAL_TABLET | Freq: Four times a day (QID) | ORAL | Status: DC | PRN

## 2020-12-14 MED ORDER — CHLORHEXIDINE GLUCONATE CLOTH 2 % EX PADS
6.0000 | MEDICATED_PAD | Freq: Every day | CUTANEOUS | Status: DC
  Administered 2020-12-15: 6 via TOPICAL

## 2020-12-14 MED ORDER — ACETAMINOPHEN 650 MG RE SUPP: 650.0000 mg | Freq: Four times a day (QID) | RECTAL | Status: DC | PRN

## 2020-12-14 NOTE — ED Triage Notes (Signed)
Pt c/o scrotum swelling x1wk, abdominal swelling & pain, states it comes & goes. Endorses black stools, notes bright red blood on toilet paper. Has not been to dialysis x3wks

## 2020-12-14 NOTE — H&P (Addendum)
History and Physical    PLEASE NOTE THAT DRAGON DICTATION SOFTWARE WAS USED IN THE CONSTRUCTION OF THIS NOTE.   Albert Harris [id number] DOB: [birth year] DOA: 12/14/2020  PCP: Kerin Perna, NP Patient coming from: home   I have personally briefly reviewed patient's old medical records in [place]  Chief Complaint: Abdominal distention  HPI: Albert Harris is a 65 y.o. male with medical history significant for end-stage renal disease on hemodialysis, anemia of chronic kidney disease with baseline hemoglobin 8-10, COPD, chronic diastolic heart failure who is admitted to [facility] on 12/14/2020 with acute on chronic diastolic heart failure after presenting from home to [facility] ED complaining of abdominal distention.   The patient acknowledges a history of end-stage renal disease on hemodialysis on a proposed Tuesday, Thursday, Saturday schedule, although he conveys suboptimal compliance and maintaining this schedule, stating that he has been "unable to attend" any of his hemodialysis sessions over the last 3 weeks.  He reports that over the last 7 to 10 days, he has been experiencing progressive abdominal distention associated with progressive scrotal edema and worsening edema in the bilateral lower extremities.  He denies overt associated abdominal discomfort, no preceding trauma or travel.  He notes associated mild shortness of breath associated with orthopnea and PND.  He is unsure, quantitatively, about any recent weight gain.  Denies any recent calf tenderness or new onset lower extremity erythema. No recent chest pain, diaphoresis, palpitations, N/V, pre-syncope, or syncope. Not associated with any recent cough, wheezing, hemoptysis.   Denies any associated subjective fever, chills, rigors, or generalized myalgias. No recent headache, neck stiffness, rhinitis, rhinorrhea, sore throat, diarrhea, or rash. No known recent COVID-19 exposures. In spite of end-stage renal disease  on HD, the patient reports that he does continue to produce a small amount of urine, and is any recent dysuria, gross hematuria, or change in urinary urgency/frequency.   In the setting of his recent progressive scrotal edema, he underwent scrotal ultrasound on 12/12/2020, which demonstrated diffuse scrotal edema, small left hydrocele, and normal sonographic appearance of the bilateral testicles.  Medical history also notable for chronic diastolic heart failure, with most recent echocardiogram in February 2021 showing LVEF 60 to 65%, no evidence of focal elevation abnormalities, mild increase in left ventricular hypertrophy, grade 2 diastolic dysfunction, mild to moderately dilated left atrium, and no evidence of significant valvular pathology.  The patient acknowledges that his outpatient diuretic regimen consists of Lasix 40 mg p.o. twice daily.     ED Course:  Vital signs in the ED were notable for the following: Temperature max 97.5, heart rate 86-91; blood pressure 197/99; respiratory rate 18-22, oxygen saturation 99 to 100% on room air.  Labs were notable for the following: CMP was notable for following: Sodium 142, potassium 4.3, bicarbonate 13, anion gap 20, BUN 108, creatinine 20.73, albumin 2.8, but otherwise, liver enzymes were found to be within normal limits.  CBC notable for white cell count of 6500, hemoglobin 9.0 relative to most recent prior hemoglobin data point of 9.3 on 12/12/2020, platelets 135.  BNP greater than 4500.  Nasopharyngeal COVID-19/influenza PCR were checked in the ED today and found to be negative.  EKG, in comparison to most recent prior EKG performed on 12/06/2020, showed sinus rhythm, heart rate 88, QTc 476, nonspecific T wave inversion in aVL, which is unchanged from most recent prior EKG, and no evidence of ST changes, including no evidence of ST elevation.  Chest x-ray showed cardiomegaly  with mild pulmonary edema and small left pleural effusion in the absence of any  evidence of infiltrate, or pneumothorax.  CT abdomen/pelvis showed interval worsening of anasarca, but otherwise demonstrated no evidence of acute intra-abdominal process.  [name] case, including vital signs, labs, and imaging were discussed with the on-call nephrologist, Dr. Candiss Norse, who will formally consult, and plans for overnight hemodialysis.   While in the ED, the following were administered: None.     Review of Systems: As per HPI otherwise 10 point review of systems negative.   Past Medical History:  Diagnosis Date   Anemia    Asthma    Carotid stenosis 09/12/2019   Chronic kidney disease    t, th. sat dialysis   Claudication in peripheral vascular disease ([place]) [id number]   Complication of anesthesia    " i HAVE A HARD TIME WAKING UP " (only one time)   COPD (chronic obstructive pulmonary disease) ([place])    COVID-19    positive on  06/16/20   GERD (gastroesophageal reflux disease)    HLD (hyperlipidemia)    Hypertension    Peripheral vascular disease (HCC)    PFO with atrial septal aneurysm    by echo 11/2017   Pneumonia    Stroke [facility])    [name] (transient ischemic attack) 11/2017   Tobacco abuse    quit 07/17/19    Past Surgical History:  Procedure Laterality Date   ABDOMINAL AORTOGRAM W/LOWER EXTREMITY N/A 06/21/2020   Procedure: ABDOMINAL AORTOGRAM W/LOWER EXTREMITY;  Surgeon: Angelia Mould, MD;  Location: [place] [facility];  Service: Cardiovascular;  Laterality: N/A;   AV FISTULA PLACEMENT Left 03/28/2020   Procedure: ARTERIOVENOUS (AV) FISTULA CREATION LEFT;  Surgeon: [name], MD;  Location: [place];  Service: Vascular;  Laterality: Left;   AV FISTULA PLACEMENT Right 09/23/2020   Procedure: RIGHT Arm ARTERIOVENOUS GRAFT CREATION.;  Surgeon: Angelia Mould, MD;  Location: [place];  Service: Vascular;  Laterality: Right;   COLONOSCOPY     COLONOSCOPY WITH PROPOFOL N/A 06/27/2020   Procedure: COLONOSCOPY WITH PROPOFOL;  Surgeon: Carol Ada, MD;  Location: [place];  Service: Endoscopy;  Laterality: N/A;   FINGER SURGERY     HEMOSTASIS CLIP PLACEMENT  06/27/2020   Procedure: HEMOSTASIS CLIP PLACEMENT;  Surgeon: Carol Ada, MD;  Location: [place];  Service: Endoscopy;;   INSERTION OF DIALYSIS CATHETER Right 06/24/2020   Procedure: INSERTION OF TUNNELED  DIALYSIS CATHETER; Removal of temporary dialysis catheter right neck;  Surgeon: Angelia Mould, MD;  Location: [place];  Service: Vascular;  Laterality: Right;   LIGATION OF ARTERIOVENOUS  FISTULA Left 07/24/2020   Procedure: LIGATION OF LEFT BRACHIOCEPHALIC ARTERIOVENOUS  FISTULA;  Surgeon: [name], MD;  Location: [place];  Service: Vascular;  Laterality: Left;   PERIPHERAL VASCULAR INTERVENTION Right 06/21/2020   Procedure: PERIPHERAL VASCULAR INTERVENTION;  Surgeon: Angelia Mould, MD;  Location: [place] [facility];  Service: Cardiovascular;  Laterality: Right;    Social History:  reports that he quit smoking about 16 months ago. His smoking use included cigarettes. He has a 50.00 pack-year smoking history. He has never used smokeless tobacco. He reports previous alcohol use of about 1.0 - 3.0 standard drink of alcohol per week. He reports that he does not use drugs.   No Known Allergies  Family History  Problem Relation Age of Onset   Hypertension Mother    Colon cancer Father    Stomach cancer Brother  Family history reviewed and not pertinent    Prior to Admission medications   Medication Sig Start Date End Date Taking? Authorizing Provider  albuterol (VENTOLIN HFA) 108 (90 Base) MCG/ACT inhaler INHALE 1-2 PUFFS INTO THE LUNGS EVERY 6 (SIX) HOURS AS NEEDED FOR WHEEZING OR SHORTNESS OF BREATH. 07/02/20 07/02/21  Maness, Arnette Norris, MD  amLODipine (NORVASC) 10 MG tablet Take 1 tablet (10 mg total) by mouth in the morning. 11/27/20   Kerin Perna, NP  amoxicillin-clavulanate (AUGMENTIN) 875-125 MG tablet Take 1 tablet by mouth  2 (two) times daily. 11/27/20   Kerin Perna, NP  aspirin 81 MG EC tablet TAKE 1 TABLET (81 MG TOTAL) BY MOUTH DAILY. SWALLOW WHOLE. Patient taking differently: Take by mouth 5 (five) times daily. SWALLOW WHOLE. 07/02/20 07/02/21  Maness, Arnette Norris, MD  atorvastatin (LIPITOR) 40 MG tablet TAKE 1 TABLET (40 MG TOTAL) BY MOUTH DAILY AT 6 PM. Patient taking differently: Take by mouth daily. at 6pm 07/02/20 07/02/21  Maness, Arnette Norris, MD  calcium acetate (PHOSLO) 667 MG capsule TAKE 2 CAPSULES BY MOUTH THREE TIMES DAILY WITH MEALS Patient taking differently: Take 667-1,334 mg by mouth 3 (three) times daily with meals. Take 2 capsules (1334 mg) by mouth with each meal & take 1 capsule (667 mg) by  mouth with each snack 08/13/20 08/13/21  Corliss Parish, MD  doxazosin (CARDURA) 4 MG tablet TAKE 1.5 TABLETS (6 MG TOTAL) BY MOUTH DAILY. Patient not taking: No sig reported 09/22/19 09/21/20  Skeet Latch, MD  furosemide (LASIX) 40 MG tablet TAKE 1 TABLET BY MOUTH TWICE A DAY, AT 8AM AND 2PM 04/23/20 04/23/21  Elmarie Shiley, MD  gabapentin (NEURONTIN) 300 MG capsule TAKE 1 CAPSULE (300 MG TOTAL) BY MOUTH 3 (THREE) TIMES DAILY. 07/31/20 07/31/21  Angelia Mould, MD  hydrOXYzine (ATARAX/VISTARIL) 50 MG tablet Take 1 tablet by mouth four times a day as needed prn for itching on dialysis days 11/19/20     metoprolol tartrate (LOPRESSOR) 25 MG tablet TAKE 1 TABLET BY MOUTH TWICE A DAY Patient taking differently: Take 25 mg by mouth 2 (two) times daily. 07/25/20 07/25/21  Corliss Parish, MD  nitroGLYCERIN (NITROSTAT) 0.4 MG SL tablet Place 1 tablet (0.4 mg total) under the tongue every 5 (five) minutes x 3 doses as needed for chest pain. 07/23/19   Aline August, MD  ondansetron (ZOFRAN ODT) 4 MG disintegrating tablet Take 1 tablet (4 mg total) by mouth every 8 (eight) hours as needed for nausea or vomiting. 11/27/20   Kerin Perna, NP  oxyCODONE-acetaminophen (PERCOCET) 7.5-325 MG tablet Take 1 tablet  by mouth every 4 (four) hours as needed for severe pain. 09/23/20 09/23/21  Setzer, Edman Circle, PA-C  sodium bicarbonate 650 MG tablet Take 650 mg by mouth daily as needed for heartburn. 02/29/20   [provider]     Objective    Physical Exam: Vitals:   12/14/20 2045 12/14/20 2200  BP: (!) 197/99 (!) 207/105  Pulse: 86 86  Resp: 20 18  Temp: (!) 97.5 F (36.4 C)   TempSrc: Oral   SpO2: 93% 100%    General: appears to be stated age; alert, oriented Skin: warm, dry, no rash Head:  AT/[place] Mouth:  Oral mucosa membranes appear moist, normal dentition Neck: supple; trachea midline Heart:  RRR; did not appreciate any M/R/G Lungs: Bibasilar crackles noted, but otherwise CTAB, did not appreciate any wheezes, or rhonchi Abdomen: + BS; soft, mildly distended, NT Vascular: 2+ pedal pulses b/l; 2+ radial pulses  b/l Extremities: 2+ edema noted in the bilateral lower extremities , no muscle wasting Neuro: strength and sensation intact in upper and lower extremities b/l     Labs on Admission: I have personally reviewed following labs and imaging studies  CBC: Recent Labs  Lab 12/12/20 1011 12/14/20 2055  WBC 6.5 6.5  NEUTROABS 4.7  --   HGB 9.3* 9.0*  HCT 30.2* 29.5*  MCV 96.8 97.7  PLT 156 [id number]*   Basic Metabolic Panel: Recent Labs  Lab 12/12/20 1011 12/14/20 2055  NA 142 142  K 4.4 4.3  CL 108 109  CO2 11* 13*  GLUCOSE 91 125*  BUN 97* 108*  CREATININE 20.09* 20.73*  CALCIUM 7.9* 8.0*   GFR: CrCl cannot be calculated (Unknown ideal weight.). Liver Function Tests: Recent Labs  Lab 12/12/20 1011 12/14/20 2055  AST 11* 11*  ALT 10 10  ALKPHOS 62 65  BILITOT 1.0 0.5  PROT 5.5* 5.7*  ALBUMIN 2.8* 2.8*   Recent Labs  Lab 12/14/20 2055  LIPASE 47   No results for input(s): AMMONIA in the last 168 hours. Coagulation Profile: No results for input(s): INR, PROTIME in the last 168 hours. Cardiac Enzymes: No results for input(s): CKTOTAL, CKMB,  CKMBINDEX, TROPONINI in the last 168 hours. BNP (last 3 results) No results for input(s): PROBNP in the last 8760 hours. HbA1C: No results for input(s): HGBA1C in the last 72 hours. CBG: No results for input(s): GLUCAP in the last 168 hours. Lipid Profile: No results for input(s): CHOL, HDL, LDLCALC, TRIG, CHOLHDL, LDLDIRECT in the last 72 hours. Thyroid Function Tests: No results for input(s): TSH, T4TOTAL, FREET4, T3FREE, THYROIDAB in the last 72 hours. Anemia Panel: No results for input(s): VITAMINB12, FOLATE, FERRITIN, TIBC, IRON, RETICCTPCT in the last 72 hours. Urine analysis:    Component Value Date/Time   COLORURINE YELLOW 12/05/2020 1339   APPEARANCEUR CLEAR 12/05/2020 1339   LABSPEC 1.016 12/05/2020 1339   PHURINE 7.0 12/05/2020 1339   GLUCOSEU 150 (A) 12/05/2020 1339   HGBUR NEGATIVE 12/05/2020 1339   BILIRUBINUR NEGATIVE 12/05/2020 1339   KETONESUR 5 (A) 12/05/2020 1339   PROTEINUR >=300 (A) 12/05/2020 1339   UROBILINOGEN 1.0 03/19/2014 1051   NITRITE NEGATIVE 12/05/2020 1339   LEUKOCYTESUR NEGATIVE 12/05/2020 1339    Radiological Exams on Admission: DG Chest Portable 1 View  Result Date: 12/14/2020 CLINICAL DATA:  65 year old male with shortness of breath. EXAM: PORTABLE CHEST 1 VIEW COMPARISON:  Chest radiograph dated 06/24/2020. FINDINGS: There is cardiomegaly with mild vascular congestion and probable mild edema. No focal consolidation or pneumothorax. Small left pleural effusion. No acute osseous pathology. IMPRESSION: Cardiomegaly with probable mild CHF and small left pleural effusion. Electronically Signed   By: Anner Crete M.D.   On: 12/14/2020 22:10     EKG: Independently reviewed, with result as described above.    Assessment/Plan   Albert Harris is a 65 y.o. male with medical history significant for end-stage renal disease on hemodialysis, anemia of chronic kidney disease with baseline hemoglobin 8-10, COPD, chronic diastolic heart failure who is  admitted to [facility] on 12/14/2020 with acute on chronic diastolic heart failure after presenting from home to [facility] ED complaining of abdominal distention.    Principal Problem:   Acute on chronic diastolic CHF (congestive heart failure) (HCC) Active Problems:   CKD (chronic kidney disease) stage 5, GFR less than 15 ml/min (HCC)   Volume overload   SOB (shortness of breath)   Hypertension  COPD (chronic obstructive pulmonary disease) (HCC)      #) Acute on chronic diastolic heart failure: dx of acute decompensation on the basis of presenting shortness of breath associated with orthopnea, PND, worsening of peripheral edema, elevated BNP, and presenting chest x-ray showing evidence of interval development of pulmonary edema and small left pleural effusion. This is in the context of a known history of chronic diastolic heart failure, with most recent echocardiogram performed in February 2021 and notable for grade 2 diastolic dysfunction with additional findings, as conveyed above. Etiology leading to presenting acutely decompensated heart failure appears to be on the basis of the patient's acknowledgment of suboptimal compliance and attending his hemodialysis sessions in the setting of a known history of end-stage renal disease, the patient readily conveying that he has not attended a single hemodialysis session over the preceding 3 weeks.  In the setting of a history of chronic diastolic heart failure, patient informs that his outpatient diuretic regimen consists of Lasix 40 mg p.o. twice daily, and he knowledges that in spite of incisional disease that he continues to produce a small amount of urine.  Overall, ACS leading to presenting acutely decompensated heart failure appears less likely at this time in the absence of any recent CP and presenting EKG showing no evidence of acute ischemic changes, particularly given alternate explanation for exacerbation of his heart failure in the setting of  missing 3 weeks of hemodialysis.  Patient's case was discussed with the on-call nephrologist, Dr. Candiss Norse, who will consult and is planning for overnight hemodialysis in the setting of volume overload and anion gap metabolic acidosis.   As patient is already on a BB at home, will plan to continue this. Of note, I utilized the Heart Failure order set to assist with my placement of orders on this patient.     Plan: monitor strict I's & O's and daily weights. Monitor continuous pulse oximetry.  Monitor on telemetry.  Nephrology consulted for overnight hemodialysis, as above. repeat BMP in the morning. Check serum magnesium level.  Resumed Lasix.  Continue home beta-blocker, as above.  Can consider pursuing echocardiogram once patient is more euvolemic.  Counseled the patient on the importance of improvement in compliance and attending his regularly scheduled hemodialysis sessions as an outpatient.      #) Anasarca: Progressive acute volume overload over the course of the last 7 to 10 days as a consequence of missing 3 weeks of hemodialysis sessions in the context of end-stage renal disease, with interval development/progression of abdominal distention, scrotal edema, and bilateral lower extremity edema, with CT abdomen/pelvis showing evidence consistent with anasarca without additional acute intra-abdominal process, while also recent scrotal ultrasound also showed diffuse scrotal edema but otherwise no evidence of acute process.  Patient's anasarca appears to stem from and coincide with his suboptimal compliance with hemodialysis, as above, with further exacerbation due to ensuing/secondary acute on chronic diastolic heart failure, as above.  No known history or clinical evidence to suggest contributory cirrhosis, and presenting INR found to be nonelevated suggesting intact hepatic synthetic function.  No known history of nephrotic syndrome.   Plan: Monitor strict I's and O's and daily weights.  Nephrology  consulted, with plan for overnight hemodialysis.  Consulted the patient on the importance of improved compliance and attending scheduled outpatient hemodialysis sessions.  Further evaluation management of acute on chronic diastolic heart failure, as further detailed above.  Repeat CMP in the morning.       #) End-stage renal disease: Documented  history of such on hemodialysis suboptimal compliance with his Tuesday, Thursday, Saturday session reporting most recent hemodialysis session occurred 3 weeks ago.  In the setting of volume overload and anion gap metabolic acidosis, Dr. Candiss Norse of nephrology has been consulted, with plan for overnight hemodialysis.  Of note, serum potassium level found to be nonelevated at 4.3.  Plan: Nephrology formally consulted, plan for overnight hemodialysis.  Monitor strict I's and O's and daily weights.  Repeat CMP in the morning.  Check and trend serum magnesium and phosphorus levels.  Continue home PhosLo.  Counseled patient on importance of improved compliance with outpatient hemodialysis, as above.       #) Anemia of chronic kidney disease: Documented history of such, and associated baseline hemoglobin range of 8-10.  Presenting CBC reflects hemoglobin consistent with this range.  Plan: Repeat CBC in the morning.  Check INR.     #) COPD: Documented history of such in the setting of the patient knowledge and that is a former smoker, having completely quit smoking in 2021 without subsequent resumption.  Outpatient respiratory regimen is limited to as needed albuterol inhaler.  No evidence to suggest acute exacerbation of underlying COPD at this time.  Plan: Resume home as needed albuterol inhaler.  Monitor continuous pulse oximetry.  Add on serum magnesium level and check serum phosphorus.     #) Hypertension: Documented history of such, with outpatient and hypertensive regimen consisting of Norvasc as well as Lopressor.  Morning hypertensive in the ED  this evening, in absence of any associated acute focal neurologic deficits.  This appears to be as a consequence of volume overload in the context of suboptimal compliance with outpatient hemodialysis.  Nephrology consulted, and planning for overnight hemodialysis, which should serve as management for volume overload associated hypertension.   Plan: Nephrology consulted, with plan for overnight hemodialysis, as above.  Monitor strict I's and O's Daily weights.  Resume home Norvasc and Lopressor.  Close monitoring of ensuing blood pressure via routine vital signs.       DVT prophylaxis: SCDs Code Status: Full code Family Communication: none Disposition Plan: Per Rounding Team Consults called: Dr. Candiss Norse of nephrology formally consulted, as further detailed above;   Admission status: obs; pcu     Of note, this patient was added by me to the following Admit List/Treatment Team: [email or web address].      PLEASE NOTE THAT DRAGON DICTATION SOFTWARE WAS USED IN THE CONSTRUCTION OF THIS NOTE.   [facility] Pager [phone] From [place]  Otherwise, please contact night-coverage  [email or web address] Password [email or web address]   12/14/2020, 10:55 PM

## 2020-12-14 NOTE — ED Provider Notes (Signed)
[facility] EMERGENCY DEPARTMENT Provider Note   CSN: [id number] Arrival date & time: 12/14/20  2037     History Chief Complaint  Patient presents with   Missed Dialysis   [place] is a 65 y.o. male.  66 y.o male with a PMH Asthma, ESRD, Stroke, PVD presents to the ED with a chief complaint of abdominal swelling x 1 week.  Patient reports he has had ongoing abdominal pain for the past 3 weeks, was originally having some diarrhea.  However now he feels like he somewhat constipated, he does strain when having a bowel movement.  He does note bright red blood on the toilet paper.  He reports because of his ongoing symptoms he has been missing dialysis for the past 3 weeks.  He also endorses swelling to his scrotum, feels like he is having shortness of breath and also cannot lay flat due to feeling like he cannot breathe.  He also endorses bilateral leg swelling.  The abdominal pain is described as a lower abdominal cramping, currently not passing any gas.  He denies any fever, chest pain, cough, other complaints.   The history is provided by the patient and medical records.      Past Medical History:  Diagnosis Date   Anemia    Asthma    Carotid stenosis 09/12/2019   Chronic kidney disease    t, th. sat dialysis   Claudication in peripheral vascular disease ([place]) [id number]   Complication of anesthesia    " i HAVE A HARD TIME WAKING UP " (only one time)   COPD (chronic obstructive pulmonary disease) ([place])    COVID-19    positive on  06/16/20   GERD (gastroesophageal reflux disease)    HLD (hyperlipidemia)    Hypertension    Peripheral vascular disease (HCC)    PFO with atrial septal aneurysm    by echo 11/2017   Pneumonia    Stroke [facility])    [name] (transient ischemic attack) 11/2017   Tobacco abuse    quit 07/17/19    Patient Active Problem List   Diagnosis Date Noted   ESRD (end stage renal disease) ([place])    Lower GI bleed    Pressure  injury of skin 06/28/2020   Acute renal failure superimposed on stage 5 chronic kidney disease, not on chronic dialysis ([place])    Acute respiratory disease due to COVID-19 virus 06/16/2020   CKD (chronic kidney disease) stage 5, GFR less than 15 ml/min ([place]) 03/19/2020   Claudication in peripheral vascular disease ([place]) 09/12/2019   Carotid stenosis 09/12/2019   PFO (patent foramen ovale) 08/10/2019   Noncompliance 08/10/2019   Hx of transient ischemic attack ([name]) 08/10/2019   Chronic diastolic heart failure (HCC)    Elevated troponin    Hypertensive emergency    Acute respiratory failure with hypoxia ([place]) 07/18/2019   Acute pulmonary edema ([place]) 07/18/2019   ARF (acute renal failure) ([place]) 07/18/2019   Anemia 07/18/2019   Right sided weakness 11/09/2017   CKD (chronic kidney disease), stage III ([place]) 11/09/2017   Tobacco abuse 01/19/2017   Hypertensive urgency 12/24/2016   [name] (transient ischemic attack) 12/24/2016    Past Surgical History:  Procedure Laterality Date   ABDOMINAL AORTOGRAM W/LOWER EXTREMITY N/A 06/21/2020   Procedure: ABDOMINAL AORTOGRAM W/LOWER EXTREMITY;  Surgeon: Angelia Mould, MD;  Location: [place] [facility];  Service: Cardiovascular;  Laterality: N/A;   AV FISTULA PLACEMENT Left 03/28/2020  Procedure: ARTERIOVENOUS (AV) FISTULA CREATION LEFT;  Surgeon: [name], MD;  Location: [place];  Service: Vascular;  Laterality: Left;   AV FISTULA PLACEMENT Right 09/23/2020   Procedure: RIGHT Arm ARTERIOVENOUS GRAFT CREATION.;  Surgeon: Angelia Mould, MD;  Location: [place];  Service: Vascular;  Laterality: Right;   COLONOSCOPY     COLONOSCOPY WITH PROPOFOL N/A 06/27/2020   Procedure: COLONOSCOPY WITH PROPOFOL;  Surgeon: Carol Ada, MD;  Location: [place];  Service: Endoscopy;  Laterality: N/A;   FINGER SURGERY     HEMOSTASIS CLIP PLACEMENT  06/27/2020   Procedure: HEMOSTASIS CLIP PLACEMENT;  Surgeon: Carol Ada, MD;  Location: [place];  Service: Endoscopy;;   INSERTION OF DIALYSIS CATHETER Right 06/24/2020   Procedure: INSERTION OF TUNNELED  DIALYSIS CATHETER; Removal of temporary dialysis catheter right neck;  Surgeon: Angelia Mould, MD;  Location: [place];  Service: Vascular;  Laterality: Right;   LIGATION OF ARTERIOVENOUS  FISTULA Left 07/24/2020   Procedure: LIGATION OF LEFT BRACHIOCEPHALIC ARTERIOVENOUS  FISTULA;  Surgeon: [name], MD;  Location: [place];  Service: Vascular;  Laterality: Left;   PERIPHERAL VASCULAR INTERVENTION Right 06/21/2020   Procedure: PERIPHERAL VASCULAR INTERVENTION;  Surgeon: Angelia Mould, MD;  Location: [place] [facility];  Service: Cardiovascular;  Laterality: Right;       Family History  Problem Relation Age of Onset   Hypertension Mother    Colon cancer Father    Stomach cancer Brother     Social History   Tobacco Use   Smoking status: Former    Packs/day: 1.00    Years: 50.00    Pack years: 50.00    Types: Cigarettes    Quit date: 07/17/2019    Years since quitting: 1.4   Smokeless tobacco: Never  Vaping Use   Vaping Use: Never used  Substance Use Topics   Alcohol use: Not Currently    Alcohol/week: 1.0 - 3.0 standard drink    Types: 1 - 3 Cans of beer per week   Drug use: No    Home Medications Prior to Admission medications   Medication Sig Start Date End Date Taking? Authorizing Provider  albuterol (VENTOLIN HFA) 108 (90 Base) MCG/ACT inhaler INHALE 1-2 PUFFS INTO THE LUNGS EVERY 6 (SIX) HOURS AS NEEDED FOR WHEEZING OR SHORTNESS OF BREATH. 07/02/20 07/02/21  Maness, Arnette Norris, MD  amLODipine (NORVASC) 10 MG tablet Take 1 tablet (10 mg total) by mouth in the morning. 11/27/20   Kerin Perna, NP  amoxicillin-clavulanate (AUGMENTIN) 875-125 MG tablet Take 1 tablet by mouth 2 (two) times daily. 11/27/20   Kerin Perna, NP  aspirin 81 MG EC tablet TAKE 1 TABLET (81 MG TOTAL) BY MOUTH DAILY. SWALLOW WHOLE. Patient taking  differently: Take by mouth 5 (five) times daily. SWALLOW WHOLE. 07/02/20 07/02/21  Maness, Arnette Norris, MD  atorvastatin (LIPITOR) 40 MG tablet TAKE 1 TABLET (40 MG TOTAL) BY MOUTH DAILY AT 6 PM. Patient taking differently: Take by mouth daily. at 6pm 07/02/20 07/02/21  Maness, Arnette Norris, MD  calcium acetate (PHOSLO) 667 MG capsule TAKE 2 CAPSULES BY MOUTH THREE TIMES DAILY WITH MEALS Patient taking differently: Take 667-1,334 mg by mouth 3 (three) times daily with meals. Take 2 capsules (1334 mg) by mouth with each meal & take 1 capsule (667 mg) by  mouth with each snack 08/13/20 08/13/21  Corliss Parish, MD  doxazosin (CARDURA) 4 MG tablet TAKE 1.5 TABLETS (6 MG TOTAL) BY MOUTH DAILY. Patient not taking: No sig  reported 09/22/19 09/21/20  Skeet Latch, MD  furosemide (LASIX) 40 MG tablet TAKE 1 TABLET BY MOUTH TWICE A DAY, AT 8AM AND 2PM 04/23/20 04/23/21  Elmarie Shiley, MD  gabapentin (NEURONTIN) 300 MG capsule TAKE 1 CAPSULE (300 MG TOTAL) BY MOUTH 3 (THREE) TIMES DAILY. 07/31/20 07/31/21  Angelia Mould, MD  hydrOXYzine (ATARAX/VISTARIL) 50 MG tablet Take 1 tablet by mouth four times a day as needed prn for itching on dialysis days 11/19/20     metoprolol tartrate (LOPRESSOR) 25 MG tablet TAKE 1 TABLET BY MOUTH TWICE A DAY Patient taking differently: Take 25 mg by mouth 2 (two) times daily. 07/25/20 07/25/21  Corliss Parish, MD  nitroGLYCERIN (NITROSTAT) 0.4 MG SL tablet Place 1 tablet (0.4 mg total) under the tongue every 5 (five) minutes x 3 doses as needed for chest pain. 07/23/19   Aline August, MD  ondansetron (ZOFRAN ODT) 4 MG disintegrating tablet Take 1 tablet (4 mg total) by mouth every 8 (eight) hours as needed for nausea or vomiting. 11/27/20   Kerin Perna, NP  oxyCODONE-acetaminophen (PERCOCET) 7.5-325 MG tablet Take 1 tablet by mouth every 4 (four) hours as needed for severe pain. 09/23/20 09/23/21  Setzer, Edman Circle, PA-C  sodium bicarbonate 650 MG tablet Take 650 mg by mouth  daily as needed for heartburn. 02/29/20   [provider]    Allergies    Patient has no known allergies.  Review of Systems   Review of Systems  Constitutional:  Negative for chills and fever.  HENT:  Negative for sore throat.   Respiratory:  Positive for shortness of breath.   Cardiovascular:  Negative for chest pain.  Gastrointestinal:  Positive for abdominal pain. Negative for diarrhea, nausea and vomiting.  Genitourinary:  Positive for scrotal swelling. Negative for flank pain.  Musculoskeletal:  Negative for back pain.  Skin:  Negative for pallor and wound.  Neurological:  Negative for light-headedness and headaches.  All other systems reviewed and are negative.  Physical Exam Updated Vital Signs BP (!) 207/105   Pulse 86   Temp (!) 97.5 F (36.4 C) (Oral)   Resp 18   SpO2 100%   Physical Exam Vitals and nursing note reviewed. Exam conducted with a chaperone present.  Constitutional:      Appearance: Normal appearance. He is ill-appearing.     Comments: Chronically ill-appearing.  HENT:     Head: Normocephalic and atraumatic.     Nose: Nose normal.     Mouth/Throat:     Mouth: Mucous membranes are moist.  Cardiovascular:     Rate and Rhythm: Normal rate.  Pulmonary:     Effort: Pulmonary effort is normal. Tachypnea present.     Breath sounds: Examination of the right-lower field reveals decreased breath sounds. Examination of the left-lower field reveals decreased breath sounds. Decreased breath sounds present.     Comments: Lungs are diminished throughout all bases. Abdominal:     General: Abdomen is flat. There is distension.  Genitourinary:    Testes:        Right: Swelling present.        Left: Swelling present.     Rectum: Guaiac result negative. External hemorrhoid and internal hemorrhoid present. No tenderness.     Comments: Chaperoned by RN Velna Hatchet Significant swelling noted to scrotal area.  No stool burden on rectal bold.  Grossly negative on  my exam. Musculoskeletal:     Cervical back: Normal range of motion and neck supple.  Skin:  General: Skin is warm and dry.  Neurological:     Mental Status: He is alert and oriented to person, place, and time.     Comments: AO X4     ED Results / Procedures / Treatments   Labs (all labs ordered are listed, but only abnormal results are displayed) Labs Reviewed  COMPREHENSIVE METABOLIC PANEL - Abnormal; Notable for the following components:      Result Value   CO2 13 (*)    Glucose, Bld 125 (*)    BUN 108 (*)    Creatinine, Ser 20.73 (*)    Calcium 8.0 (*)    Total Protein 5.7 (*)    Albumin 2.8 (*)    AST 11 (*)    GFR, Estimated 2 (*)    Anion gap 20 (*)    All other components within normal limits  CBC - Abnormal; Notable for the following components:   RBC 3.02 (*)    Hemoglobin 9.0 (*)    HCT 29.5 (*)    RDW 16.8 (*)    Platelets 135 (*)    All other components within normal limits  POC OCCULT BLOOD, ED - Abnormal; Notable for the following components:   Fecal Occult Bld POSITIVE (*)    All other components within normal limits  LIPASE, BLOOD  BRAIN NATRIURETIC PEPTIDE    EKG None  Radiology DG Chest Portable 1 View  Result Date: 12/14/2020 CLINICAL DATA:  65 year old male with shortness of breath. EXAM: PORTABLE CHEST 1 VIEW COMPARISON:  Chest radiograph dated 06/24/2020. FINDINGS: There is cardiomegaly with mild vascular congestion and probable mild edema. No focal consolidation or pneumothorax. Small left pleural effusion. No acute osseous pathology. IMPRESSION: Cardiomegaly with probable mild CHF and small left pleural effusion. Electronically Signed   By: Anner Crete M.D.   On: 12/14/2020 22:10    Procedures Procedures   Medications Ordered in ED Medications  Chlorhexidine Gluconate Cloth 2 % PADS 6 each (has no administration in time range)    ED Course  I have reviewed the triage vital signs and the nursing notes.  Pertinent labs & imaging  results that were available during my care of the patient were reviewed by me and considered in my medical decision making (see chart for details).  Clinical Course as of 12/14/20 2239  Sat Dec 14, 2020  2230 Fecal Occult Blood, POC(!): POSITIVE [JS]  2231 Anion gap(!): 20 [JS]  2232 CO2(!): 13 [JS]    Clinical Course User Index [JS] Janeece Fitting, PA-C   MDM Rules/Calculators/A&P     Patient with extensive past medical history including ESRD presents to the ED with a chief complaint of shortness of breath, abdominal pain, peripheral edema.  Patient reports he has had some abdominal discomfort in the past 3 weeks, consistent with diarrhea not constipation along with blood in his stool in the toilet paper.  Also endorses scrotal swelling, along with bilateral leg swelling that has been ongoing for the past 3 weeks.  He is currently on dialysis Tuesday, Thursday, Saturday however has not been in 3 weeks he states.  During primary evaluation he is chronically ill-appearing, blood pressure is significant elevated with systolic in the 123456 and a diastolic in the [id number].  He is afebrile, oxygen saturation arrival is 93%.  He is tachypneic primary encounter, but also has orthopnea during the rest of my examination.  Abdomen is distended with slight decrease in bowel sounds.  Bilateral pitting edema 1+, no calf tenderness.  Alert  and oriented x4.  His chart was reviewed by me, he does have a visit from last month on December 05, 2020.  He presented with similar etiology then with a week of generalized intermittent abdominal pain and distention, had a negative CT.  And was disposition home.  On today's visit, CBC without any leukocytosis, hemoglobin is decreased at 9.0.  Lipase level is within normal limits.  CMP remarkable for creatinine level of 20.73,  elevated but within his baseline from previous.  BUN is 108, he is alert and oriented, although noncompliant with his dialysis sessions.  LFTs with slight  decrease in AST, anion gap of 20.  DG chest showed: Cardiomegaly with probable mild CHF and small left pleural effusion.  10:06 PM Spoke to Dr. Candiss Norse nephrology on-call who recommended dialysis likely tonight versus tomorrow morning.  Patient will likely be added to the list to be dialyzed soon as possible.  Hemoccult is positive, although visible hemorrhoids on my exam, suspect originated from them.  Patient will likely need GI consult, will reobtain CT abdomen and pelvis although this was within normal limits during his last visit.  Due to patient's condition feel that patient will need from hospitalization at this time.  Will place call for hospitalist admission.  10:39 PM Spoke to Dr. Velia Meyer, appreciate his assistance who will admit for further management.  Patient hemodynamically stable for admission.  Portions of this note were generated with Lobbyist. Dictation errors may occur despite best attempts at proofreading.  Final Clinical Impression(s) / ED Diagnoses Final diagnoses:  Abdominal swelling  Shortness of breath    Rx / DC Orders ED Discharge Orders     None        Janeece Fitting, Hershal Coria 12/14/20 2239    Valarie Merino, MD 12/15/20 [phone]

## 2020-12-14 NOTE — Progress Notes (Signed)
Informed of patient in er. Apparently has not gone to dialysis for 3 weeks. Per review of his outpatient HD records at [facility], he is noncompliant with treatments (frequently misses treatments and does not stay for full time). Here with hypervolemia. High HD census overnight, discussed with HD RN. Will set him up for HD tomorrow. Full consult to follow in AM.  Outpatient orders: [facility] TTS. [id number], 4hrs, 400/800, 2k, 2cal, edw 62g, mircera 267mg every 2 weeks, venofer '100mg'$  iv qtx, hectorol 585m qtreatment  Albert QuintMD [place]

## 2020-12-14 NOTE — Progress Notes (Signed)
Brief note regarding plan, with full H&P to follow:  65 year old male with history of end-stage renal disease on hemodialysis, who is admitted with acute volume overload after presenting with abdominal distention, peripheral edema after missing each of his hemodialysis sessions over the course of the last 3 weeks.  Presenting labs notable for potassium 4.3, mild anion gap metabolic acidosis. CXR c/w volume overload.  Dr. Candiss Norse of nephrology has been consulted, with plan for hemodialysis to occur either tonight or first thing tomorrow morning. Telemetry.  Continuous pulse oximetry.    Babs Bertin, DO Hospitalist

## 2020-12-15 ENCOUNTER — Other Ambulatory Visit: Payer: Self-pay

## 2020-12-15 ENCOUNTER — Encounter (HOSPITAL_COMMUNITY): Payer: Self-pay | Admitting: Internal Medicine

## 2020-12-15 DIAGNOSIS — K648 Other hemorrhoids: Secondary | ICD-10-CM | POA: Diagnosis present

## 2020-12-15 DIAGNOSIS — N433 Hydrocele, unspecified: Secondary | ICD-10-CM | POA: Diagnosis present

## 2020-12-15 DIAGNOSIS — Z20822 Contact with and (suspected) exposure to covid-19: Secondary | ICD-10-CM | POA: Diagnosis present

## 2020-12-15 DIAGNOSIS — J9811 Atelectasis: Secondary | ICD-10-CM | POA: Diagnosis present

## 2020-12-15 DIAGNOSIS — Q211 Atrial septal defect: Secondary | ICD-10-CM | POA: Diagnosis not present

## 2020-12-15 DIAGNOSIS — Z7189 Other specified counseling: Secondary | ICD-10-CM | POA: Diagnosis not present

## 2020-12-15 DIAGNOSIS — E872 Acidosis: Secondary | ICD-10-CM | POA: Diagnosis present

## 2020-12-15 DIAGNOSIS — I6529 Occlusion and stenosis of unspecified carotid artery: Secondary | ICD-10-CM | POA: Diagnosis present

## 2020-12-15 DIAGNOSIS — J449 Chronic obstructive pulmonary disease, unspecified: Secondary | ICD-10-CM | POA: Diagnosis present

## 2020-12-15 DIAGNOSIS — I5033 Acute on chronic diastolic (congestive) heart failure: Secondary | ICD-10-CM | POA: Diagnosis present

## 2020-12-15 DIAGNOSIS — J431 Panlobular emphysema: Secondary | ICD-10-CM

## 2020-12-15 DIAGNOSIS — N2581 Secondary hyperparathyroidism of renal origin: Secondary | ICD-10-CM | POA: Diagnosis present

## 2020-12-15 DIAGNOSIS — Z9115 Patient's noncompliance with renal dialysis: Secondary | ICD-10-CM | POA: Diagnosis not present

## 2020-12-15 DIAGNOSIS — I10 Essential (primary) hypertension: Secondary | ICD-10-CM | POA: Diagnosis not present

## 2020-12-15 DIAGNOSIS — I132 Hypertensive heart and chronic kidney disease with heart failure and with stage 5 chronic kidney disease, or end stage renal disease: Secondary | ICD-10-CM | POA: Diagnosis present

## 2020-12-15 DIAGNOSIS — Z992 Dependence on renal dialysis: Secondary | ICD-10-CM | POA: Diagnosis not present

## 2020-12-15 DIAGNOSIS — R1900 Intra-abdominal and pelvic swelling, mass and lump, unspecified site: Secondary | ICD-10-CM | POA: Diagnosis not present

## 2020-12-15 DIAGNOSIS — E8779 Other fluid overload: Secondary | ICD-10-CM

## 2020-12-15 DIAGNOSIS — I70201 Unspecified atherosclerosis of native arteries of extremities, right leg: Secondary | ICD-10-CM | POA: Diagnosis present

## 2020-12-15 DIAGNOSIS — N185 Chronic kidney disease, stage 5: Secondary | ICD-10-CM | POA: Diagnosis not present

## 2020-12-15 DIAGNOSIS — D631 Anemia in chronic kidney disease: Secondary | ICD-10-CM | POA: Diagnosis present

## 2020-12-15 DIAGNOSIS — K644 Residual hemorrhoidal skin tags: Secondary | ICD-10-CM | POA: Diagnosis present

## 2020-12-15 DIAGNOSIS — R0602 Shortness of breath: Secondary | ICD-10-CM | POA: Diagnosis present

## 2020-12-15 DIAGNOSIS — M898X9 Other specified disorders of bone, unspecified site: Secondary | ICD-10-CM | POA: Diagnosis present

## 2020-12-15 DIAGNOSIS — E876 Hypokalemia: Secondary | ICD-10-CM | POA: Diagnosis not present

## 2020-12-15 DIAGNOSIS — N186 End stage renal disease: Secondary | ICD-10-CM | POA: Diagnosis present

## 2020-12-15 DIAGNOSIS — R7989 Other specified abnormal findings of blood chemistry: Secondary | ICD-10-CM | POA: Diagnosis present

## 2020-12-15 DIAGNOSIS — E785 Hyperlipidemia, unspecified: Secondary | ICD-10-CM | POA: Diagnosis present

## 2020-12-15 DIAGNOSIS — Z8616 Personal history of COVID-19: Secondary | ICD-10-CM | POA: Diagnosis not present

## 2020-12-15 DIAGNOSIS — I161 Hypertensive emergency: Secondary | ICD-10-CM | POA: Diagnosis present

## 2020-12-15 DIAGNOSIS — K219 Gastro-esophageal reflux disease without esophagitis: Secondary | ICD-10-CM | POA: Diagnosis present

## 2020-12-15 HISTORY — DX: Acute on chronic diastolic (congestive) heart failure: I50.33

## 2020-12-15 LAB — PHOSPHORUS: Phosphorus: >30 mg/dL — ABNORMAL HIGH (ref 2.5–4.6)

## 2020-12-15 LAB — COMPREHENSIVE METABOLIC PANEL
ALT: 9 U/L (ref 0–44)
AST: 10 U/L — ABNORMAL LOW (ref 15–41)
Albumin: 2.7 g/dL — ABNORMAL LOW (ref 3.5–5.0)
Alkaline Phosphatase: 63 U/L (ref 38–126)
Anion gap: 19 — ABNORMAL HIGH (ref 5–15)
BUN: 114 mg/dL — ABNORMAL HIGH (ref 8–23)
CO2: 12 mmol/L — ABNORMAL LOW (ref 22–32)
Calcium: 7.8 mg/dL — ABNORMAL LOW (ref 8.9–10.3)
Chloride: 111 mmol/L (ref 98–111)
Creatinine, Ser: 20.18 mg/dL — ABNORMAL HIGH (ref 0.61–1.24)
GFR, Estimated: 2 mL/min — ABNORMAL LOW (ref >60)
Glucose, Bld: 92 mg/dL (ref 70–99)
Potassium: 4.4 mmol/L (ref 3.5–5.1)
Sodium: 142 mmol/L (ref 135–145)
Total Bilirubin: 0.8 mg/dL (ref 0.3–1.2)
Total Protein: 5.5 g/dL — ABNORMAL LOW (ref 6.5–8.1)

## 2020-12-15 LAB — BLOOD GAS, VENOUS
Acid-base deficit: 15.9 mmol/L — ABNORMAL HIGH (ref 0.0–2.0)
Bicarbonate: 10.4 mmol/L — ABNORMAL LOW (ref 20.0–28.0)
Drawn by: 6286
O2 Saturation: 85.8 %
Patient temperature: 37
pCO2, Ven: 27.1 mmHg — ABNORMAL LOW (ref 44.0–60.0)
pH, Ven: 7.21 — ABNORMAL LOW (ref 7.250–7.430)
pO2, Ven: 66.1 mmHg — ABNORMAL HIGH (ref 32.0–45.0)

## 2020-12-15 LAB — HIV ANTIBODY (ROUTINE TESTING W REFLEX): HIV Screen 4th Generation wRfx: NONREACTIVE

## 2020-12-15 LAB — CBC
HCT: 26.7 % — ABNORMAL LOW (ref 39.0–52.0)
Hemoglobin: 8.4 g/dL — ABNORMAL LOW (ref 13.0–17.0)
MCH: 30 pg (ref 26.0–34.0)
MCHC: 31.5 g/dL (ref 30.0–36.0)
MCV: 95.4 fL (ref 80.0–100.0)
Platelets: 125 10*3/uL — ABNORMAL LOW (ref 150–400)
RBC: 2.8 MIL/uL — ABNORMAL LOW (ref 4.22–5.81)
RDW: 16.6 % — ABNORMAL HIGH (ref 11.5–15.5)
WBC: 6.7 10*3/uL (ref 4.0–10.5)
nRBC: 0 % (ref 0.0–0.2)

## 2020-12-15 LAB — HEPATITIS B CORE ANTIBODY, TOTAL: Hep B Core Total Ab: NONREACTIVE

## 2020-12-15 LAB — PROTIME-INR
INR: 1.1 (ref 0.8–1.2)
Prothrombin Time: 14.5 seconds (ref 11.4–15.2)

## 2020-12-15 LAB — HEPATITIS B SURFACE ANTIGEN: Hepatitis B Surface Ag: NONREACTIVE

## 2020-12-15 LAB — MRSA NEXT GEN BY PCR, NASAL: MRSA by PCR Next Gen: DETECTED — AB

## 2020-12-15 LAB — MAGNESIUM: Magnesium: 3.6 mg/dL — ABNORMAL HIGH (ref 1.7–2.4)

## 2020-12-15 MED ORDER — ALTEPLASE 2 MG IJ SOLR: 2.0000 mg | Freq: Once | INTRAMUSCULAR | Status: DC | PRN

## 2020-12-15 MED ORDER — FUROSEMIDE 40 MG PO TABS
40.0000 mg | ORAL_TABLET | Freq: Two times a day (BID) | ORAL | Status: DC
  Administered 2020-12-15 – 2020-12-19 (×8): 40 mg via ORAL
  Filled 2020-12-15 (×8): qty 1

## 2020-12-15 MED ORDER — ALBUTEROL SULFATE HFA 108 (90 BASE) MCG/ACT IN AERS: 1.0000 | INHALATION_SPRAY | Freq: Four times a day (QID) | RESPIRATORY_TRACT | Status: DC | PRN

## 2020-12-15 MED ORDER — DIPHENHYDRAMINE HCL 25 MG PO CAPS
ORAL_CAPSULE | ORAL | Status: AC
  Filled 2020-12-15: qty 1

## 2020-12-15 MED ORDER — DIPHENHYDRAMINE HCL 25 MG PO CAPS
25.0000 mg | ORAL_CAPSULE | Freq: Once | ORAL | Status: AC
  Administered 2020-12-15: 25 mg via ORAL
  Filled 2020-12-15: qty 1

## 2020-12-15 MED ORDER — SODIUM CHLORIDE 0.9 % IV SOLN: 100.0000 mL | INTRAVENOUS | Status: DC | PRN

## 2020-12-15 MED ORDER — HEPARIN SODIUM (PORCINE) 1000 UNIT/ML DIALYSIS: 1000.0000 [IU] | INTRAMUSCULAR | Status: DC | PRN

## 2020-12-15 MED ORDER — LIDOCAINE-PRILOCAINE 2.5-2.5 % EX CREA: 1.0000 "application " | TOPICAL_CREAM | CUTANEOUS | Status: DC | PRN

## 2020-12-15 MED ORDER — METOPROLOL TARTRATE 25 MG PO TABS
25.0000 mg | ORAL_TABLET | Freq: Two times a day (BID) | ORAL | Status: DC
  Administered 2020-12-15 – 2020-12-18 (×5): 25 mg via ORAL
  Filled 2020-12-15 (×6): qty 1

## 2020-12-15 MED ORDER — PROSOURCE PLUS PO LIQD
30.0000 mL | Freq: Three times a day (TID) | ORAL | Status: DC
  Administered 2020-12-15 – 2020-12-17 (×5): 30 mL via ORAL
  Filled 2020-12-15 (×6): qty 30

## 2020-12-15 MED ORDER — DIPHENHYDRAMINE HCL 25 MG PO CAPS
25.0000 mg | ORAL_CAPSULE | Freq: Four times a day (QID) | ORAL | Status: DC | PRN
  Administered 2020-12-15 – 2020-12-19 (×4): 25 mg via ORAL

## 2020-12-15 MED ORDER — DOXERCALCIFEROL 4 MCG/2ML IV SOLN
2.0000 ug | INTRAVENOUS | Status: DC
  Filled 2020-12-15 (×2): qty 2

## 2020-12-15 MED ORDER — COVID-19 MRNA VAC-TRIS(PFIZER) 30 MCG/0.3ML IM SUSP
0.3000 mL | Freq: Once | INTRAMUSCULAR | Status: DC
  Filled 2020-12-15: qty 0.3

## 2020-12-15 MED ORDER — DARBEPOETIN ALFA 200 MCG/0.4ML IJ SOSY
200.0000 ug | PREFILLED_SYRINGE | INTRAMUSCULAR | Status: DC
  Filled 2020-12-15: qty 0.4

## 2020-12-15 MED ORDER — ALBUTEROL SULFATE (2.5 MG/3ML) 0.083% IN NEBU: 2.5000 mg | INHALATION_SOLUTION | Freq: Four times a day (QID) | RESPIRATORY_TRACT | Status: DC | PRN

## 2020-12-15 MED ORDER — LIDOCAINE HCL (PF) 1 % IJ SOLN: 5.0000 mL | INTRAMUSCULAR | Status: DC | PRN

## 2020-12-15 MED ORDER — CALCIUM ACETATE (PHOS BINDER) 667 MG PO CAPS
2001.0000 mg | ORAL_CAPSULE | Freq: Three times a day (TID) | ORAL | Status: DC
  Administered 2020-12-16 – 2020-12-19 (×7): 2001 mg via ORAL
  Filled 2020-12-15 (×7): qty 3

## 2020-12-15 MED ORDER — AMLODIPINE BESYLATE 10 MG PO TABS
10.0000 mg | ORAL_TABLET | Freq: Every morning | ORAL | Status: DC
  Administered 2020-12-15 – 2020-12-18 (×4): 10 mg via ORAL
  Filled 2020-12-15 (×4): qty 1

## 2020-12-15 MED ORDER — PENTAFLUOROPROP-TETRAFLUOROETH EX AERO: 1.0000 "application " | INHALATION_SPRAY | CUTANEOUS | Status: DC | PRN

## 2020-12-15 NOTE — Progress Notes (Signed)
PROGRESS NOTE    Albert Harris  [id number] DOB: Jun 11, 1955 DOA: 12/14/2020 PCP: Kerin Perna, NP     Brief Narrative:   65 y.o. WM PMHx ESRD on HD T/TH/Sat , anemia of chronic kidney disease with baseline hemoglobin 8-10, COPD, chronic diastolic heart failure   Admitted to [facility] on 12/14/2020 with acute on chronic diastolic heart failure after presenting from home to [facility] ED complaining of abdominal distention.    The patient acknowledges a history of end-stage renal disease on hemodialysis on a proposed Tuesday, Thursday, Saturday schedule, although he conveys suboptimal compliance and maintaining this schedule, stating that he has been "unable to attend" any of his hemodialysis sessions over the last 3 weeks.  He reports that over the last 7 to 10 days, he has been experiencing progressive abdominal distention associated with progressive scrotal edema and worsening edema in the bilateral lower extremities.  He denies overt associated abdominal discomfort, no preceding trauma or travel.  He notes associated mild shortness of breath associated with orthopnea and PND.  He is unsure, quantitatively, about any recent weight gain.  Denies any recent calf tenderness or new onset lower extremity erythema. No recent chest pain, diaphoresis, palpitations, N/V, pre-syncope, or syncope. Not associated with any recent cough, wheezing, hemoptysis.    Denies any associated subjective fever, chills, rigors, or generalized myalgias. No recent headache, neck stiffness, rhinitis, rhinorrhea, sore throat, diarrhea, or rash. No known recent COVID-19 exposures. In spite of end-stage renal disease on HD, the patient reports that he does continue to produce a small amount of urine, and is any recent dysuria, gross hematuria, or change in urinary urgency/frequency.    In the setting of his recent progressive scrotal edema, he underwent scrotal ultrasound on 12/12/2020, which demonstrated diffuse scrotal  edema, small left hydrocele, and normal sonographic appearance of the bilateral testicles.   Medical history also notable for chronic diastolic heart failure, with most recent echocardiogram in February 2021 showing LVEF 60 to 65%, no evidence of focal elevation abnormalities, mild increase in left ventricular hypertrophy, grade 2 diastolic dysfunction, mild to moderately dilated left atrium, and no evidence of significant valvular pathology.  The patient acknowledges that his outpatient diuretic regimen consists of Lasix 40 mg p.o. twice daily.         ED Course:  Vital signs in the ED were notable for the following: Temperature max 97.5, heart rate 86-91; blood pressure 197/99; respiratory rate 18-22, oxygen saturation 99 to 100% on room air.   Labs were notable for the following: CMP was notable for following: Sodium 142, potassium 4.3, bicarbonate 13, anion gap 20, BUN 108, creatinine 20.73, albumin 2.8, but otherwise, liver enzymes were found to be within normal limits.  CBC notable for white cell count of 6500, hemoglobin 9.0 relative to most recent prior hemoglobin data point of 9.3 on 12/12/2020, platelets 135.  BNP greater than 4500.  Nasopharyngeal COVID-19/influenza PCR were checked in the ED today and found to be negative.   EKG, in comparison to most recent prior EKG performed on 12/06/2020, showed sinus rhythm, heart rate 88, QTc 476, nonspecific T wave inversion in aVL, which is unchanged from most recent prior EKG, and no evidence of ST changes, including no evidence of ST elevation.  Chest x-ray showed cardiomegaly with mild pulmonary edema and small left pleural effusion in the absence of any evidence of infiltrate, or pneumothorax.  CT abdomen/pelvis showed interval worsening of anasarca, but otherwise demonstrated no evidence of acute  intra-abdominal process.   [name] case, including vital signs, labs, and imaging were discussed with the on-call nephrologist, Dr. Candiss Norse, who will  formally consult, and plans for overnight hemodialysis.    While in the ED, the following were administered: None.   Subjective: A/O x4, patient admits that he is missed~3 weeks of HD and understands that he may die secondary to missing HD sessions.   Assessment & Plan: Covid vaccination; negative vaccination.  Request to be vaccinated.  7/10 vaccination requested   Principal Problem:   Acute on chronic diastolic CHF (congestive heart failure) (HCC) Active Problems:   CKD (chronic kidney disease) stage 5, GFR less than 15 ml/min (HCC)   Volume overload   SOB (shortness of breath)   Hypertension   COPD (chronic obstructive pulmonary disease) (HCC)   Acute on chronic diastolic CHF - Strict in and out - Daily weight   Essential HTN      ESRD on HD T/TH/Sat  -Noncompliant with HD has missed 3 weeks of HD -7/10 per patient had HD today - 7/10 HD per nephrology: Would anticipate patient will need several more sessions given he has missed 3 weeks of HD  Anemia of chronic kidney disease (baseline HgB 8-10) Lab Results  Component Value Date   HGB 8.4 (L) 12/15/2020   HGB 9.0 (L) 12/14/2020   HGB 9.3 (L) 12/12/2020   HGB 10.2 (L) 12/05/2020   HGB 10.0 (L) 12/05/2020  -Within normal limits   Anasarca   COPD       Ulcer Pressure Injury 06/27/20 Buttocks Upper;Mid;Right Stage 2 -  Partial thickness loss of dermis presenting as a shallow open injury with a red, pink wound bed without slough. (Active)  06/27/20 0900  Location: Buttocks  Location Orientation: Upper;Mid;Right  Staging: Stage 2 -  Partial thickness loss of dermis presenting as a shallow open injury with a red, pink wound bed without slough.  Wound Description (Comments):   Present on Admission:    Goals of care - 7/10 palliative care consult:Patient with frequent noncompliance with HD.  Skip 3 weeks of hemodialysis per nephrology.  Evaluate for CODE STATUS changed to DNR.  Advanced directives to include home  with palliative care or hospice.   DVT prophylaxis: SCD Code Status: full Family Communication:  Status is: Inpatient    Dispo: The patient is from:               Anticipated d/c is to:               Anticipated d/c date is:               Patient currently       Consultants:    Procedures/Significant Events:    I have personally reviewed and interpreted all radiology studies and my findings are as above.  VENTILATOR SETTINGS:    Cultures   Antimicrobials:    Devices    LINES / TUBES:      Continuous Infusions:   Objective: Vitals:   12/15/20 1110 12/15/20 1210 12/15/20 1215 12/15/20 1215  BP: (!) 193/92 (!) 169/94  (!) 169/94  Pulse: 89 92  89  Resp: '16 16  20  '$ Temp: 98.5 F (36.9 C) 98.7 F (37.1 C)  98.7 F (37.1 C)  TempSrc: Oral Oral  Oral  SpO2: 98% 92% 97% 98%  Weight:      Height:        Intake/Output Summary (Last 24 hours) at 12/15/2020 1534 Last data  filed at 12/15/2020 1420 Gross per 24 hour  Intake 120 ml  Output 4125 ml  Net -4005 ml   Filed Weights   12/15/20 0100 12/15/20 0658  Weight: 74.9 kg 77.6 kg    Examination:  General: A/O x4, No acute respiratory distress Eyes: negative scleral hemorrhage, negative anisocoria, negative icterus ENT: Negative Runny nose, negative gingival bleeding, Neck:  Negative scars, masses, torticollis, lymphadenopathy, JVD Lungs: Clear to auscultation bilaterally without wheezes or crackles Cardiovascular: Regular rate and rhythm without murmur gallop or rub normal S1 and S2 Abdomen: negative abdominal pain, nondistended, positive soft, bowel sounds, no rebound, no ascites, no appreciable mass Extremities: No significant cyanosis, clubbing, or edema bilateral lower extremities Skin: Negative rashes, lesions, ulcers Psychiatric:  Negative depression, negative anxiety, negative fatigue, negative mania EXTREMELY POOR understanding of disease process/or LACK of caring about  consequences Central nervous system:  Cranial nerves II through XII intact, tongue/uvula midline, all extremities muscle strength 5/5, sensation intact throughout, negative dysarthria, negative expressive aphasia, negative receptive aphasia.  .     Data Reviewed: Care during the described time interval was provided by me .  I have reviewed this patient's available data, including medical history, events of note, physical examination, and all test results as part of my evaluation.  CBC: Recent Labs  Lab 12/12/20 1011 12/14/20 2055 12/15/20 0548  WBC 6.5 6.5 6.7  NEUTROABS 4.7  --   --   HGB 9.3* 9.0* 8.4*  HCT 30.2* 29.5* 26.7*  MCV 96.8 97.7 95.4  PLT 156 135* 0000000*   Basic Metabolic Panel: Recent Labs  Lab 12/12/20 1011 12/14/20 2055 12/15/20 0548  NA 142 142 142  K 4.4 4.3 4.4  CL 108 109 111  CO2 11* 13* 12*  GLUCOSE 91 125* 92  BUN 97* 108* 114*  CREATININE 20.09* 20.73* 20.18*  CALCIUM 7.9* 8.0* 7.8*  MG  --   --  3.6*  PHOS  --   --  >30.0*   GFR: Estimated Creatinine Clearance: 3.5 mL/min (A) (by C-G formula based on SCr of 20.18 mg/dL (H)). Liver Function Tests: Recent Labs  Lab 12/12/20 1011 12/14/20 2055 12/15/20 0548  AST 11* 11* 10*  ALT '10 10 9  '$ ALKPHOS 62 65 63  BILITOT 1.0 0.5 0.8  PROT 5.5* 5.7* 5.5*  ALBUMIN 2.8* 2.8* 2.7*   Recent Labs  Lab 12/14/20 2055  LIPASE 47   No results for input(s): AMMONIA in the last 168 hours. Coagulation Profile: Recent Labs  Lab 12/15/20 0548  INR 1.1   Cardiac Enzymes: No results for input(s): CKTOTAL, CKMB, CKMBINDEX, TROPONINI in the last 168 hours. BNP (last 3 results) No results for input(s): PROBNP in the last 8760 hours. HbA1C: No results for input(s): HGBA1C in the last 72 hours. CBG: No results for input(s): GLUCAP in the last 168 hours. Lipid Profile: No results for input(s): CHOL, HDL, LDLCALC, TRIG, CHOLHDL, LDLDIRECT in the last 72 hours. Thyroid Function Tests: No results for  input(s): TSH, T4TOTAL, FREET4, T3FREE, THYROIDAB in the last 72 hours. Anemia Panel: No results for input(s): VITAMINB12, FOLATE, FERRITIN, TIBC, IRON, RETICCTPCT in the last 72 hours. Sepsis Labs: No results for input(s): PROCALCITON, LATICACIDVEN in the last 168 hours.  Recent Results (from the past 240 hour(s))  Resp Panel by RT-PCR (Flu A&B, Covid) Nasopharyngeal Swab     Status: None   Collection Time: 12/14/20 10:49 PM   Specimen: Nasopharyngeal Swab; Nasopharyngeal(NP) swabs in vial transport medium  Result Value Ref Range Status  SARS Coronavirus 2 by RT PCR NEGATIVE NEGATIVE Final    Comment: (NOTE) SARS-CoV-2 target nucleic acids are NOT DETECTED.  The SARS-CoV-2 RNA is generally detectable in upper respiratory specimens during the acute phase of infection. The lowest concentration of SARS-CoV-2 viral copies this assay can detect is 138 copies/mL. A negative result does not preclude SARS-Cov-2 infection and should not be used as the sole basis for treatment or other patient management decisions. A negative result may occur with  improper specimen collection/handling, submission of specimen other than nasopharyngeal swab, presence of viral mutation(s) within the areas targeted by this assay, and inadequate number of viral copies(<138 copies/mL). A negative result must be combined with clinical observations, patient history, and epidemiological information. The expected result is Negative.  Fact Sheet for Patients:  EntrepreneurPulse.com.au  Fact Sheet for Healthcare Providers:  IncredibleEmployment.be  This test is no t yet approved or cleared by the [place] FDA and  has been authorized for detection and/or diagnosis of SARS-CoV-2 by FDA under an Emergency Use Authorization (EUA). This EUA will remain  in effect (meaning this test can be used) for the duration of the COVID-19 declaration under Section 564(b)(1) of the Act,  21 U.S.C.section 360bbb-3(b)(1), unless the authorization is terminated  or revoked sooner.       Influenza A by PCR NEGATIVE NEGATIVE Final   Influenza B by PCR NEGATIVE NEGATIVE Final    Comment: (NOTE) The Xpert Xpress SARS-CoV-2/FLU/RSV plus assay is intended as an aid in the diagnosis of influenza from Nasopharyngeal swab specimens and should not be used as a sole basis for treatment. Nasal washings and aspirates are unacceptable for Xpert Xpress SARS-CoV-2/FLU/RSV testing.  Fact Sheet for Patients: EntrepreneurPulse.com.au  Fact Sheet for Healthcare Providers: IncredibleEmployment.be  This test is not yet approved or cleared by the [place] FDA and has been authorized for detection and/or diagnosis of SARS-CoV-2 by FDA under an Emergency Use Authorization (EUA). This EUA will remain in effect (meaning this test can be used) for the duration of the COVID-19 declaration under Section 564(b)(1) of the Act, 21 U.S.C. section 360bbb-3(b)(1), unless the authorization is terminated or revoked.  Performed at [facility], [place] [address], [place], [place] [address]   MRSA Next Gen by PCR, Nasal     Status: Abnormal   Collection Time: 12/15/20  5:22 AM   Specimen: Nasal Mucosa; Nasal Swab  Result Value Ref Range Status   MRSA by PCR Next Gen DETECTED (A) NOT DETECTED Final    Comment: RESULT CALLED TO, READ BACK BY AND VERIFIED WITH: WALL, K RN 12/15/2020 AT 0704 SKEEN,P (NOTE) The GeneXpert MRSA Assay (FDA approved for NASAL specimens only), is one component of a comprehensive MRSA colonization surveillance program. It is not intended to diagnose MRSA infection nor to guide or monitor treatment for MRSA infections. Test performance is not FDA approved in patients less than 41 years old. Performed at [facility], [place] [address], [place], [place] [address]          Radiology Studies: CT ABDOMEN PELVIS WO  CONTRAST  Result Date: 12/14/2020 CLINICAL DATA:  65 year old male with abdominal pain. EXAM: CT ABDOMEN AND PELVIS WITHOUT CONTRAST TECHNIQUE: Multidetector CT imaging of the abdomen and pelvis was performed following the standard protocol without IV contrast. COMPARISON:  CT abdomen pelvis dated 12/05/2020. FINDINGS: Evaluation of this exam is limited in the absence of intravenous contrast. Lower chest: Partially visualized bilateral pleural effusions, right greater than left. There is  diffuse interstitial and interlobular septal prominence most consistent with edema. There is mild cardiomegaly with coronary vascular calcification. No intra-abdominal free air. There is diffuse mesenteric edema and small ascites. Hepatobiliary: The liver is unremarkable. No intrahepatic biliary dilatation. High attenuating content within the gallbladder may represent sludge or small stones. Evaluation of the gallbladder is limited on this CT due to diffuse mesenteric edema and anasarca. Ultrasound may provide better evaluation if there is clinical concern for acute cholecystitis. Pancreas: The pancreas is unremarkable as visualized. Spleen: Normal in size without focal abnormality. Adrenals/Urinary Tract: The adrenal glands are unremarkable. Moderate bilateral renal parenchyma atrophy. There is no hydronephrosis or nephrolithiasis on either side. Renal vascular calcifications noted. The urinary bladder is minimally distended. High attenuating content within the bladder may represent excreted contrast from prior study or proteinaceous urine. Correlation with urinalysis recommended. Stomach/Bowel: There is sigmoid diverticulosis with muscular hypertrophy. No active inflammatory changes. There is no bowel obstruction. The appendix is normal. Vascular/Lymphatic: Advanced aortoiliac atherosclerotic disease. Bilateral common iliac artery stents noted. The IVC is grossly unremarkable. No portal venous gas. There is no adenopathy.  Reproductive: The prostate and seminal vesicles are grossly unremarkable. Other: Diffuse subcutaneous edema and anasarca and severe scrotal edema, worsened since the prior CT. No fluid collection. Musculoskeletal: Degenerative changes of the spine. No acute osseous pathology. IMPRESSION: 1. Interval worsening of the anasarca. 2. Sigmoid diverticulosis. No bowel obstruction. Normal appendix. 3. Probable sludge within the gallbladder. 4. No hydronephrosis or nephrolithiasis. High attenuating content within the bladder may represent excreted contrast from prior study or proteinaceous urine. Correlation with urinalysis recommended. 5. Aortic Atherosclerosis (ICD10-I70.0). Electronically Signed   By: Anner Crete M.D.   On: 12/14/2020 23:39   DG Chest Portable 1 View  Result Date: 12/14/2020 CLINICAL DATA:  65 year old male with shortness of breath. EXAM: PORTABLE CHEST 1 VIEW COMPARISON:  Chest radiograph dated 06/24/2020. FINDINGS: There is cardiomegaly with mild vascular congestion and probable mild edema. No focal consolidation or pneumothorax. Small left pleural effusion. No acute osseous pathology. IMPRESSION: Cardiomegaly with probable mild CHF and small left pleural effusion. Electronically Signed   By: Anner Crete M.D.   On: 12/14/2020 22:10        Scheduled Meds:  amLODipine  10 mg Oral q AM   Chlorhexidine Gluconate Cloth  6 each Topical Q0600   diphenhydrAMINE       furosemide  40 mg Oral BID   metoprolol tartrate  25 mg Oral BID   Continuous Infusions:   LOS: 0 days    Time spent:40 min    Florella Mcneese, Geraldo Docker, MD [facility]   If 7PM-7AM, please contact night-coverage 12/15/2020, 3:34 PM

## 2020-12-15 NOTE — Consult Note (Signed)
[facility] Renal Consultation Note    Indication for Consultation:  Management of ESRD/hemodialysis; anemia, hypertension/volume and secondary hyperparathyroidism  RC:[id number], Milford Cage, NP  HPI: Albert Harris is a 65 y.o. male.  With with ESRD on HD TTS at [facility]. His past medical history significant for anemia of chronic disease, COPD, and chronic diastolic heart failure.  Patient presents to the ER with abdominal distention and scrotal/extremity swelling consistent with volume overload.  Patient with known history of non-adherence to hemodialysis treatments.  Reviewed outpatient hemodialysis records.  Patient has missed total of 3 weeks of hemodialysis treatments.  Also noted patient cutting treatment times short. Seen and examined patient while receiving HD today.  He reports itching in which Benadryl was administered.  Also reports abdominal swelling.  Denied SOB and CP.  CXR showed mild CHF and small left pleural effusion. Patient tolerated net UF of 4L today.  Plan for sequential HD for tomorrow 12/16/20 (3hrs).  Past Medical History:  Diagnosis Date   Anemia    Asthma    Carotid stenosis 09/12/2019   Chronic kidney disease    t, th. sat dialysis   Claudication in peripheral vascular disease ([place]) [id number]   Complication of anesthesia    " i HAVE A HARD TIME WAKING UP " (only one time)   COPD (chronic obstructive pulmonary disease) ([place])    COVID-19    positive on  06/16/20   GERD (gastroesophageal reflux disease)    HLD (hyperlipidemia)    Hypertension    Peripheral vascular disease (HCC)    PFO with atrial septal aneurysm    by echo 11/2017   Pneumonia    Stroke [facility])    [name] (transient ischemic attack) 11/2017   Tobacco abuse    quit 07/17/19   Past Surgical History:  Procedure Laterality Date   ABDOMINAL AORTOGRAM W/LOWER EXTREMITY N/A 06/21/2020   Procedure: ABDOMINAL AORTOGRAM W/LOWER EXTREMITY;  Surgeon: Angelia Mould, MD;   Location: [place] [facility];  Service: Cardiovascular;  Laterality: N/A;   AV FISTULA PLACEMENT Left 03/28/2020   Procedure: ARTERIOVENOUS (AV) FISTULA CREATION LEFT;  Surgeon: [name], MD;  Location: [place];  Service: Vascular;  Laterality: Left;   AV FISTULA PLACEMENT Right 09/23/2020   Procedure: RIGHT Arm ARTERIOVENOUS GRAFT CREATION.;  Surgeon: Angelia Mould, MD;  Location: [place];  Service: Vascular;  Laterality: Right;   COLONOSCOPY     COLONOSCOPY WITH PROPOFOL N/A 06/27/2020   Procedure: COLONOSCOPY WITH PROPOFOL;  Surgeon: Carol Ada, MD;  Location: [place];  Service: Endoscopy;  Laterality: N/A;   FINGER SURGERY     HEMOSTASIS CLIP PLACEMENT  06/27/2020   Procedure: HEMOSTASIS CLIP PLACEMENT;  Surgeon: Carol Ada, MD;  Location: [place];  Service: Endoscopy;;   INSERTION OF DIALYSIS CATHETER Right 06/24/2020   Procedure: INSERTION OF TUNNELED  DIALYSIS CATHETER; Removal of temporary dialysis catheter right neck;  Surgeon: Angelia Mould, MD;  Location: [place];  Service: Vascular;  Laterality: Right;   LIGATION OF ARTERIOVENOUS  FISTULA Left 07/24/2020   Procedure: LIGATION OF LEFT BRACHIOCEPHALIC ARTERIOVENOUS  FISTULA;  Surgeon: [name], MD;  Location: [place];  Service: Vascular;  Laterality: Left;   PERIPHERAL VASCULAR INTERVENTION Right 06/21/2020   Procedure: PERIPHERAL VASCULAR INTERVENTION;  Surgeon: Angelia Mould, MD;  Location: [place] [facility];  Service: Cardiovascular;  Laterality: Right;   Family History  Problem Relation Age of Onset   Hypertension Mother    Colon  cancer Father    Stomach cancer Brother    Social History:  reports that he quit smoking about 17 months ago. His smoking use included cigarettes. He has a 50.00 pack-year smoking history. He has never used smokeless tobacco. He reports previous alcohol use of about 1.0 - 3.0 standard drink of alcohol per week. He reports that he does not use  drugs. No Known Allergies Prior to Admission medications   Medication Sig Start Date End Date Taking? Authorizing Provider  albuterol (VENTOLIN HFA) 108 (90 Base) MCG/ACT inhaler INHALE 1-2 PUFFS INTO THE LUNGS EVERY 6 (SIX) HOURS AS NEEDED FOR WHEEZING OR SHORTNESS OF BREATH. 07/02/20 07/02/21 Yes Maness, Arnette Norris, MD  amLODipine (NORVASC) 10 MG tablet Take 1 tablet (10 mg total) by mouth in the morning. 11/27/20  Yes Kerin Perna, NP  aspirin 81 MG EC tablet TAKE 1 TABLET (81 MG TOTAL) BY MOUTH DAILY. SWALLOW WHOLE. Patient taking differently: Take 81 mg by mouth daily as needed for pain. SWALLOW WHOLE. 07/02/20 07/02/21 Yes Maness, Arnette Norris, MD  calcium acetate (PHOSLO) 667 MG capsule TAKE 2 CAPSULES BY MOUTH THREE TIMES DAILY WITH MEALS Patient taking differently: Take 667-1,334 mg by mouth 3 (three) times daily with meals. Take 2 capsules (1334 mg) by mouth with each meal & take 1 capsule (667 mg) by  mouth with each snack 08/13/20 08/13/21 Yes Corliss Parish, MD  furosemide (LASIX) 40 MG tablet TAKE 1 TABLET BY MOUTH TWICE A DAY, AT 8AM AND 2PM Patient taking differently: Take 40 mg by mouth 2 (two) times daily. 04/23/20 04/23/21 Yes Elmarie Shiley, MD  hydrOXYzine (ATARAX/VISTARIL) 50 MG tablet Take 1 tablet by mouth four times a day as needed prn for itching on dialysis days Patient taking differently: Take 50 mg by mouth every 4 (four) hours as needed for itching (on dialysis days). 11/19/20  Yes   lidocaine-prilocaine (EMLA) cream Apply 1 application topically See admin instructions. Apply a small amount to access site 1-2 hours before dialysis,cover with occlusive dressing(saran wrap) 11/21/20  Yes [provider]  metoprolol tartrate (LOPRESSOR) 25 MG tablet TAKE 1 TABLET BY MOUTH TWICE A DAY Patient taking differently: Take 25 mg by mouth 2 (two) times daily. 07/25/20 07/25/21 Yes Corliss Parish, MD  nitroGLYCERIN (NITROSTAT) 0.4 MG SL tablet Place 1 tablet (0.4 mg total) under  the tongue every 5 (five) minutes x 3 doses as needed for chest pain. 07/23/19  Yes Aline August, MD  ondansetron (ZOFRAN ODT) 4 MG disintegrating tablet Take 1 tablet (4 mg total) by mouth every 8 (eight) hours as needed for nausea or vomiting. 11/27/20  Yes Kerin Perna, NP  oxyCODONE-acetaminophen (PERCOCET) 7.5-325 MG tablet Take 1 tablet by mouth every 4 (four) hours as needed for severe pain. 09/23/20 09/23/21 Yes Setzer, Edman Circle, PA-C  sodium bicarbonate 650 MG tablet Take 650 mg by mouth daily as needed for heartburn. 02/29/20  Yes [provider]  amoxicillin-clavulanate (AUGMENTIN) 875-125 MG tablet Take 1 tablet by mouth 2 (two) times daily. Patient not taking: No sig reported 11/27/20   Kerin Perna, NP  atorvastatin (LIPITOR) 40 MG tablet TAKE 1 TABLET (40 MG TOTAL) BY MOUTH DAILY AT 6 PM. Patient not taking: No sig reported 07/02/20 07/02/21  Delora Fuel, MD  doxazosin (CARDURA) 4 MG tablet TAKE 1.5 TABLETS (6 MG TOTAL) BY MOUTH DAILY. Patient not taking: No sig reported 09/22/19 09/21/20  Skeet Latch, MD  gabapentin (NEURONTIN) 300 MG capsule TAKE 1 CAPSULE (300 MG TOTAL) BY  MOUTH 3 (THREE) TIMES DAILY. Patient not taking: Reported on 12/14/2020 07/31/20 07/31/21  Angelia Mould, MD   Current Facility-Administered Medications  Medication Dose Route Frequency Provider Last Rate Last Admin   acetaminophen (TYLENOL) tablet 650 mg  650 mg Oral Q6H PRN Howerter, Justin B, DO       Or   acetaminophen (TYLENOL) suppository 650 mg  650 mg Rectal Q6H PRN Howerter, Justin B, DO       albuterol (PROVENTIL) (2.5 MG/3ML) 0.083% nebulizer solution 2.5 mg  2.5 mg Nebulization Q6H PRN Howerter, Justin B, DO       amLODipine (NORVASC) tablet 10 mg  10 mg Oral q AM Howerter, Justin B, DO   10 mg at 12/15/20 0251   Chlorhexidine Gluconate Cloth 2 % PADS 6 each  6 each Topical Q0600 [name], MD   6 each at 12/15/20 0520   diphenhydrAMINE (BENADRYL) 25 mg capsule             diphenhydrAMINE (BENADRYL) capsule 25 mg  25 mg Oral Q6H PRN Claudia Desanctis, MD   25 mg at 12/15/20 0732   furosemide (LASIX) tablet 40 mg  40 mg Oral BID Howerter, Justin B, DO   40 mg at 12/15/20 0251   metoprolol tartrate (LOPRESSOR) tablet 25 mg  25 mg Oral BID Rhetta Mura, DO       Labs: Basic Metabolic Panel: Recent Labs  Lab 12/12/20 1011 12/14/20 2055 12/15/20 0548  NA 142 142 142  K 4.4 4.3 4.4  CL 108 109 111  CO2 11* 13* 12*  GLUCOSE 91 125* 92  BUN 97* 108* 114*  CREATININE 20.09* 20.73* 20.18*  CALCIUM 7.9* 8.0* 7.8*  PHOS  --   --  >30.0*   Liver Function Tests: Recent Labs  Lab 12/12/20 1011 12/14/20 2055 12/15/20 0548  AST 11* 11* 10*  ALT '10 10 9  '$ ALKPHOS 62 65 63  BILITOT 1.0 0.5 0.8  PROT 5.5* 5.7* 5.5*  ALBUMIN 2.8* 2.8* 2.7*   Recent Labs  Lab 12/14/20 2055  LIPASE 47   No results for input(s): AMMONIA in the last 168 hours. CBC: Recent Labs  Lab 12/12/20 1011 12/14/20 2055 12/15/20 0548  WBC 6.5 6.5 6.7  NEUTROABS 4.7  --   --   HGB 9.3* 9.0* 8.4*  HCT 30.2* 29.5* 26.7*  MCV 96.8 97.7 95.4  PLT 156 135* 125*   Cardiac Enzymes: No results for input(s): CKTOTAL, CKMB, CKMBINDEX, TROPONINI in the last 168 hours. CBG: No results for input(s): GLUCAP in the last 168 hours. Iron Studies: No results for input(s): IRON, TIBC, TRANSFERRIN, FERRITIN in the last 72 hours. Studies/Results: CT ABDOMEN PELVIS WO CONTRAST  Result Date: 12/14/2020 CLINICAL DATA:  65 year old male with abdominal pain. EXAM: CT ABDOMEN AND PELVIS WITHOUT CONTRAST TECHNIQUE: Multidetector CT imaging of the abdomen and pelvis was performed following the standard protocol without IV contrast. COMPARISON:  CT abdomen pelvis dated 12/05/2020. FINDINGS: Evaluation of this exam is limited in the absence of intravenous contrast. Lower chest: Partially visualized bilateral pleural effusions, right greater than left. There is diffuse interstitial and interlobular  septal prominence most consistent with edema. There is mild cardiomegaly with coronary vascular calcification. No intra-abdominal free air. There is diffuse mesenteric edema and small ascites. Hepatobiliary: The liver is unremarkable. No intrahepatic biliary dilatation. High attenuating content within the gallbladder may represent sludge or small stones. Evaluation of the gallbladder is limited on this CT due to diffuse mesenteric edema  and anasarca. Ultrasound may provide better evaluation if there is clinical concern for acute cholecystitis. Pancreas: The pancreas is unremarkable as visualized. Spleen: Normal in size without focal abnormality. Adrenals/Urinary Tract: The adrenal glands are unremarkable. Moderate bilateral renal parenchyma atrophy. There is no hydronephrosis or nephrolithiasis on either side. Renal vascular calcifications noted. The urinary bladder is minimally distended. High attenuating content within the bladder may represent excreted contrast from prior study or proteinaceous urine. Correlation with urinalysis recommended. Stomach/Bowel: There is sigmoid diverticulosis with muscular hypertrophy. No active inflammatory changes. There is no bowel obstruction. The appendix is normal. Vascular/Lymphatic: Advanced aortoiliac atherosclerotic disease. Bilateral common iliac artery stents noted. The IVC is grossly unremarkable. No portal venous gas. There is no adenopathy. Reproductive: The prostate and seminal vesicles are grossly unremarkable. Other: Diffuse subcutaneous edema and anasarca and severe scrotal edema, worsened since the prior CT. No fluid collection. Musculoskeletal: Degenerative changes of the spine. No acute osseous pathology. IMPRESSION: 1. Interval worsening of the anasarca. 2. Sigmoid diverticulosis. No bowel obstruction. Normal appendix. 3. Probable sludge within the gallbladder. 4. No hydronephrosis or nephrolithiasis. High attenuating content within the bladder may represent  excreted contrast from prior study or proteinaceous urine. Correlation with urinalysis recommended. 5. Aortic Atherosclerosis (ICD10-I70.0). Electronically Signed   By: Anner Crete M.D.   On: 12/14/2020 23:39   DG Chest Portable 1 View  Result Date: 12/14/2020 CLINICAL DATA:  65 year old male with shortness of breath. EXAM: PORTABLE CHEST 1 VIEW COMPARISON:  Chest radiograph dated 06/24/2020. FINDINGS: There is cardiomegaly with mild vascular congestion and probable mild edema. No focal consolidation or pneumothorax. Small left pleural effusion. No acute osseous pathology. IMPRESSION: Cardiomegaly with probable mild CHF and small left pleural effusion. Electronically Signed   By: Anner Crete M.D.   On: 12/14/2020 22:10    Physical Exam: Vitals:   12/15/20 1110 12/15/20 1210 12/15/20 1215 12/15/20 1215  BP: (!) 193/92 (!) 169/94  (!) 169/94  Pulse: 89 92  89  Resp: '16 16  20  '$ Temp: 98.5 F (36.9 C) 98.7 F (37.1 C)  98.7 F (37.1 C)  TempSrc: Oral Oral  Oral  SpO2: 98% 92% 97% 98%  Weight:      Height:         General: WDWN NAD Head: NCAT sclera not icteric MMM Lungs: Noted wheezing with rhonchi anteriorly. Breathing mildly labored. Heart: RRR. No murmur, rubs or gallops.  Abdomen: soft, nontender, +bowel sounds. Lower extremities: no edema BLLE Neuro: AAOx3. Moves all extremities spontaneously. Psych:  Responds to questions appropriately with a normal affect. Dialysis Access: R AVG (+) Bruit/Thrill  Dialysis Orders:  TTS - [place] ([facility])  4hrs, BFR 400, DFR 800,  EDW 62kg, 2K/ 2Ca  Heparin: No Systemic Heparin Mircera 225 mcg q2wks - last 10/29/20 Hectorol 6mg (dose increased) IV qHD - 391m last given 11/23/20 Venofer: previously ordered load X 10 doses but only received 2 doses 6/11 and 6/14 Calcium Acetate 2 capsules TID with meals   Last Labs: Hgb 8.4, TSAT 16 (OP HD 11/21/20), K 4.4, Ca 7.8, P >30,  Alb 2.7  Assessment/Plan: Acute on  chronic diastolic heart failure: in setting of missed HD over 3-week period-resume lasix and BB ESRD - on HD TTS: patient with history of non-compliance with HD treatments. Missed HD over 3-week period. Patient received HD today-tolerated net UF 4L. Plan for sequential HD tomorrow 7/11 for 3hrs. Will assess volume status tomorrow after HD-may need short treatment on Tues 7/12 and  be placed back on his regular schedule. Hypertension/volume  - BPs elevated in the setting of volume overload. Continue Metoprolol, Amlodipine, and Lasix. Oush UF. Anemia of CKD - Hgb 8.4-on max ESA as outpatient but didn't receive dose d/t missing HD. Also noted patient previously receiving Fe load but didn't complete-only received 2 doses 6/11 and 6/14. Will obtain anemia panel in AM. I suspect he may need Fe load here. Will resume ESA. Secondary Hyperparathyroidism -  Corr Ca 8.8; PO4 currently very high-will obtain PTH in AM. Resume VDRA and binders. Nutrition - Albumin low 2.7-will add protein supplements. Continue renal diet with fluid restriction.  Tobie Poet, NP [facility] 12/15/2020, 3:23 PM

## 2020-12-15 NOTE — Progress Notes (Signed)
Date and time results received: 12/15/20 0801   Critical Lab Value: Phosphorus >30.0  Name of Provider Notified: Sherral Hammers MD

## 2020-12-16 DIAGNOSIS — N186 End stage renal disease: Secondary | ICD-10-CM

## 2020-12-16 DIAGNOSIS — Z992 Dependence on renal dialysis: Secondary | ICD-10-CM

## 2020-12-16 DIAGNOSIS — Z7189 Other specified counseling: Secondary | ICD-10-CM

## 2020-12-16 LAB — CBC WITH DIFFERENTIAL/PLATELET
Abs Immature Granulocytes: 0.02 10*3/uL (ref 0.00–0.07)
Basophils Absolute: 0 10*3/uL (ref 0.0–0.1)
Basophils Relative: 1 %
Eosinophils Absolute: 0.6 10*3/uL — ABNORMAL HIGH (ref 0.0–0.5)
Eosinophils Relative: 12 %
HCT: 27.5 % — ABNORMAL LOW (ref 39.0–52.0)
Hemoglobin: 8.9 g/dL — ABNORMAL LOW (ref 13.0–17.0)
Immature Granulocytes: 0 %
Lymphocytes Relative: 16 %
Lymphs Abs: 0.8 10*3/uL (ref 0.7–4.0)
MCH: 30.1 pg (ref 26.0–34.0)
MCHC: 32.4 g/dL (ref 30.0–36.0)
MCV: 92.9 fL (ref 80.0–100.0)
Monocytes Absolute: 0.5 10*3/uL (ref 0.1–1.0)
Monocytes Relative: 10 %
Neutro Abs: 2.9 10*3/uL (ref 1.7–7.7)
Neutrophils Relative %: 61 %
Platelets: 119 10*3/uL — ABNORMAL LOW (ref 150–400)
RBC: 2.96 MIL/uL — ABNORMAL LOW (ref 4.22–5.81)
RDW: 16.5 % — ABNORMAL HIGH (ref 11.5–15.5)
WBC: 4.8 10*3/uL (ref 4.0–10.5)
nRBC: 0 % (ref 0.0–0.2)

## 2020-12-16 LAB — RENAL FUNCTION PANEL
Albumin: 2.6 g/dL — ABNORMAL LOW (ref 3.5–5.0)
Anion gap: 14 (ref 5–15)
BUN: 41 mg/dL — ABNORMAL HIGH (ref 8–23)
CO2: 23 mmol/L (ref 22–32)
Calcium: 8 mg/dL — ABNORMAL LOW (ref 8.9–10.3)
Chloride: 102 mmol/L (ref 98–111)
Creatinine, Ser: 10.27 mg/dL — ABNORMAL HIGH (ref 0.61–1.24)
GFR, Estimated: 5 mL/min — ABNORMAL LOW (ref >60)
Glucose, Bld: 89 mg/dL (ref 70–99)
Phosphorus: 8.8 mg/dL — ABNORMAL HIGH (ref 2.5–4.6)
Potassium: 3.3 mmol/L — ABNORMAL LOW (ref 3.5–5.1)
Sodium: 139 mmol/L (ref 135–145)

## 2020-12-16 LAB — IRON AND TIBC
Iron: 53 ug/dL (ref 45–182)
Saturation Ratios: 25 % (ref 17.9–39.5)
TIBC: 211 ug/dL — ABNORMAL LOW (ref 250–450)
UIBC: 158 ug/dL

## 2020-12-16 LAB — HEPATITIS B SURFACE ANTIBODY,QUALITATIVE: Hep B S Ab: REACTIVE — AB

## 2020-12-16 LAB — FERRITIN: Ferritin: 205 ng/mL (ref 24–336)

## 2020-12-16 MED ORDER — DIPHENHYDRAMINE HCL 25 MG PO CAPS
ORAL_CAPSULE | ORAL | Status: AC
  Filled 2020-12-16: qty 1

## 2020-12-16 MED ORDER — CHLORHEXIDINE GLUCONATE CLOTH 2 % EX PADS
6.0000 | MEDICATED_PAD | Freq: Every day | CUTANEOUS | Status: DC
  Administered 2020-12-17: 6 via TOPICAL

## 2020-12-16 NOTE — Progress Notes (Signed)
[facility] Progress Note   Subjective:   Patient seen and examined at bedside in room.  Reports improvement in swelling and breathing after dialysis yesterday.  Denies CP, n/v/d, abdominal pain, weakness, dizziness and fatigue.  Tolerated dialysis well.    Objective Vitals:   12/16/20 0449 12/16/20 0719 12/16/20 1340 12/16/20 1400  BP: (!) 180/88 (!) 186/91 (!) 189/92 (!) 163/80  Pulse: 87 86    Resp: '20 17 16 '$ (!) 24  Temp: 98.4 F (36.9 C) 98.1 F (36.7 C) 98.2 F (36.8 C)   TempSrc: Oral Oral Oral   SpO2: 95% 92% 96%   Weight: 70.1 kg     Height:       Physical Exam General:ill appearing male in NAD with facial edema Heart:RRR, no mrg appreciated Lungs:+crackles and wheezing b/l, nml WOB on RA Abdomen:soft, NT, mildly distended Extremities:1-2+ LE edema b/l Dialysis Access: RU AVG +b/t   Filed Weights   12/15/20 0100 12/15/20 0658 12/16/20 0449  Weight: 74.9 kg 77.6 kg 70.1 kg    Intake/Output Summary (Last 24 hours) at 12/16/2020 1402 Last data filed at 12/16/2020 1235 Gross per 24 hour  Intake 750 ml  Output 300 ml  Net 450 ml    Additional Objective Labs: Basic Metabolic Panel: Recent Labs  Lab 12/14/20 2055 12/15/20 0548 12/16/20 0423  NA 142 142 139  K 4.3 4.4 3.3*  CL 109 111 102  CO2 13* 12* 23  GLUCOSE 125* 92 89  BUN 108* 114* 41*  CREATININE 20.73* 20.18* 10.27*  CALCIUM 8.0* 7.8* 8.0*  PHOS  --  >30.0* 8.8*   Liver Function Tests: Recent Labs  Lab 12/12/20 1011 12/14/20 2055 12/15/20 0548 12/16/20 0423  AST 11* 11* 10*  --   ALT '10 10 9  '$ --   ALKPHOS 62 65 63  --   BILITOT 1.0 0.5 0.8  --   PROT 5.5* 5.7* 5.5*  --   ALBUMIN 2.8* 2.8* 2.7* 2.6*   Recent Labs  Lab 12/14/20 2055  LIPASE 47   CBC: Recent Labs  Lab 12/12/20 1011 12/14/20 2055 12/15/20 0548 12/16/20 0423  WBC 6.5 6.5 6.7 4.8  NEUTROABS 4.7  --   --  2.9  HGB 9.3* 9.0* 8.4* 8.9*  HCT 30.2* 29.5* 26.7* 27.5*  MCV 96.8 97.7 95.4 92.9  PLT  156 135* 125* 119*   Iron Studies:  Recent Labs    12/16/20 0423  IRON 53  TIBC 211*  FERRITIN 205   Lab Results  Component Value Date   INR 1.1 12/15/2020   INR 1.2 06/16/2020   INR 0.88 11/09/2017   Studies/Results: CT ABDOMEN PELVIS WO CONTRAST  Result Date: 12/14/2020 CLINICAL DATA:  65 year old male with abdominal pain. EXAM: CT ABDOMEN AND PELVIS WITHOUT CONTRAST TECHNIQUE: Multidetector CT imaging of the abdomen and pelvis was performed following the standard protocol without IV contrast. COMPARISON:  CT abdomen pelvis dated 12/05/2020. FINDINGS: Evaluation of this exam is limited in the absence of intravenous contrast. Lower chest: Partially visualized bilateral pleural effusions, right greater than left. There is diffuse interstitial and interlobular septal prominence most consistent with edema. There is mild cardiomegaly with coronary vascular calcification. No intra-abdominal free air. There is diffuse mesenteric edema and small ascites. Hepatobiliary: The liver is unremarkable. No intrahepatic biliary dilatation. High attenuating content within the gallbladder may represent sludge or small stones. Evaluation of the gallbladder is limited on this CT due to diffuse mesenteric edema and anasarca. Ultrasound may provide better evaluation  if there is clinical concern for acute cholecystitis. Pancreas: The pancreas is unremarkable as visualized. Spleen: Normal in size without focal abnormality. Adrenals/Urinary Tract: The adrenal glands are unremarkable. Moderate bilateral renal parenchyma atrophy. There is no hydronephrosis or nephrolithiasis on either side. Renal vascular calcifications noted. The urinary bladder is minimally distended. High attenuating content within the bladder may represent excreted contrast from prior study or proteinaceous urine. Correlation with urinalysis recommended. Stomach/Bowel: There is sigmoid diverticulosis with muscular hypertrophy. No active inflammatory  changes. There is no bowel obstruction. The appendix is normal. Vascular/Lymphatic: Advanced aortoiliac atherosclerotic disease. Bilateral common iliac artery stents noted. The IVC is grossly unremarkable. No portal venous gas. There is no adenopathy. Reproductive: The prostate and seminal vesicles are grossly unremarkable. Other: Diffuse subcutaneous edema and anasarca and severe scrotal edema, worsened since the prior CT. No fluid collection. Musculoskeletal: Degenerative changes of the spine. No acute osseous pathology. IMPRESSION: 1. Interval worsening of the anasarca. 2. Sigmoid diverticulosis. No bowel obstruction. Normal appendix. 3. Probable sludge within the gallbladder. 4. No hydronephrosis or nephrolithiasis. High attenuating content within the bladder may represent excreted contrast from prior study or proteinaceous urine. Correlation with urinalysis recommended. 5. Aortic Atherosclerosis (ICD10-I70.0). Electronically Signed   By: Anner Crete M.D.   On: 12/14/2020 23:39   DG Chest Portable 1 View  Result Date: 12/14/2020 CLINICAL DATA:  65 year old male with shortness of breath. EXAM: PORTABLE CHEST 1 VIEW COMPARISON:  Chest radiograph dated 06/24/2020. FINDINGS: There is cardiomegaly with mild vascular congestion and probable mild edema. No focal consolidation or pneumothorax. Small left pleural effusion. No acute osseous pathology. IMPRESSION: Cardiomegaly with probable mild CHF and small left pleural effusion. Electronically Signed   By: Anner Crete M.D.   On: 12/14/2020 22:10    Medications:   (feeding supplement) PROSource Plus  30 mL Oral TID BM   amLODipine  10 mg Oral q AM   calcium acetate  2,001 mg Oral TID WC   Chlorhexidine Gluconate Cloth  6 each Topical Q0600   COVID-19 mRNA Vac-TriS ([employer])  0.3 mL Intramuscular Once   [START ON 12/17/2020] darbepoetin (ARANESP) injection - DIALYSIS  200 mcg Intravenous Q Tue-HD   [START ON 12/17/2020] doxercalciferol  2 mcg  Intravenous Q T,Th,Sa-HD   furosemide  40 mg Oral BID   metoprolol tartrate  25 mg Oral BID    Dialysis Orders: TTS - [place] ([facility])  4hrs, BFR 400, DFR 800,  EDW 62kg, 2K/ 2Ca   Heparin: No Systemic Heparin Mircera 225 mcg q2wks - last 10/29/20 Hectorol 30mg (dose increased) IV qHD - 368m last given 11/23/20 Venofer: previously ordered load X 10 doses but only received 2 doses 6/11 and 6/14 Calcium Acetate 2 capsules TID with meals  Assessment/Plan: Acute on chronic diastolic heart failure: in setting of missing 3 weeks of HD. Continue lasix and volume removal with HD. ESRD - on HD TTS: Hx non compliance with missed HD x 3weeks.  Serial HD ordered with HD yesterday, today and again tomorrow to resume regular schedule. K 3.3, use increased K bath. Hypertension/volume  - BPs elevated in the setting of volume overload, improving with HD. Continue Metoprolol, Amlodipine, and Lasix. Continues to be 8kg over dry weight per weights.  UF 4L yesterday and plan for UF 4-5L today as tolerated.  Anemia of CKD - Hgb 8.9-on max ESA as outpatient but missed dose. Iron loaded started as OP but only received 2 doses, continue iron load here, tsat 25%, ferritin  205.  Secondary Hyperparathyroidism -  Corr Ca 8.8; PO4 greatly improved but remains elevated.   Continue VDRA and binders.   Nutrition - Renal diet w/fluid restrictions. Albumin low 2.6-will add protein supplements.   Jen Mow, PA-C [facility] 12/16/2020,2:02 PM  LOS: 1 day

## 2020-12-16 NOTE — Progress Notes (Signed)
PROGRESS NOTE    Albert Harris  Albert Harris DOB: [birth year] DOA: 12/14/2020 PCP: Kerin Perna, NP     Brief Narrative:   65 y.o. WM PMHx ESRD on HD T/TH/Sat , anemia of chronic kidney disease with baseline hemoglobin 8-10, COPD, chronic diastolic heart failure   Admitted to [facility] on 12/14/2020 with acute on chronic diastolic heart failure after presenting from home to [facility] ED complaining of abdominal distention.    The patient acknowledges a history of end-stage renal disease on hemodialysis on a proposed Tuesday, Thursday, Saturday schedule, although he conveys suboptimal compliance and maintaining this schedule, stating that he has been "unable to attend" any of his hemodialysis sessions over the last 3 weeks.  He reports that over the last 7 to 10 days, he has been experiencing progressive abdominal distention associated with progressive scrotal edema and worsening edema in the bilateral lower extremities.  He denies overt associated abdominal discomfort, no preceding trauma or travel.  He notes associated mild shortness of breath associated with orthopnea and PND.  He is unsure, quantitatively, about any recent weight gain.  Denies any recent calf tenderness or new onset lower extremity erythema. No recent chest pain, diaphoresis, palpitations, N/V, pre-syncope, or syncope. Not associated with any recent cough, wheezing, hemoptysis.    Denies any associated subjective fever, chills, rigors, or generalized myalgias. No recent headache, neck stiffness, rhinitis, rhinorrhea, sore throat, diarrhea, or rash. No known recent COVID-19 exposures. In spite of end-stage renal disease on HD, the patient reports that he does continue to produce a small amount of urine, and is any recent dysuria, gross hematuria, or change in urinary urgency/frequency.    In the setting of his recent progressive scrotal edema, he underwent scrotal ultrasound on 12/12/2020, which demonstrated diffuse scrotal  edema, small left hydrocele, and normal sonographic appearance of the bilateral testicles.   Medical history also notable for chronic diastolic heart failure, with most recent echocardiogram in February 2021 showing LVEF 60 to 65%, no evidence of focal elevation abnormalities, mild increase in left ventricular hypertrophy, grade 2 diastolic dysfunction, mild to moderately dilated left atrium, and no evidence of significant valvular pathology.  The patient acknowledges that his outpatient diuretic regimen consists of Lasix 40 mg p.o. twice daily.         ED Course:  Vital signs in the ED were notable for the following: Temperature max 97.5, heart rate 86-91; blood pressure 197/99; respiratory rate 18-22, oxygen saturation 99 to 100% on room air.   Labs were notable for the following: CMP was notable for following: Sodium 142, potassium 4.3, bicarbonate 13, anion gap 20, BUN 108, creatinine 20.73, albumin 2.8, but otherwise, liver enzymes were found to be within normal limits.  CBC notable for white cell count of 6500, hemoglobin 9.0 relative to most recent prior hemoglobin data point of 9.3 on 12/12/2020, platelets 135.  BNP greater than 4500.  Nasopharyngeal COVID-19/influenza PCR were checked in the ED today and found to be negative.   EKG, in comparison to most recent prior EKG performed on 12/06/2020, showed sinus rhythm, heart rate 88, QTc 476, nonspecific T wave inversion in aVL, which is unchanged from most recent prior EKG, and no evidence of ST changes, including no evidence of ST elevation.  Chest x-ray showed cardiomegaly with mild pulmonary edema and small left pleural effusion in the absence of any evidence of infiltrate, or pneumothorax.  CT abdomen/pelvis showed interval worsening of anasarca, but otherwise demonstrated no evidence of acute  intra-abdominal process.   [name] case, including vital signs, labs, and imaging were discussed with the on-call nephrologist, Dr. Candiss Norse, who will  formally consult, and plans for overnight hemodialysis.    While in the ED, the following were administered: None.   Subjective: 7/11 afebrile overnight    A/O x4, patient admits that he is missed~3 weeks of HD and understands that he may die secondary to missing HD sessions.   Assessment & Plan: Covid vaccination; negative vaccination.  Request to be vaccinated.  7/10 vaccination requested   Principal Problem:   Acute on chronic diastolic CHF (congestive heart failure) (HCC) Active Problems:   CKD (chronic kidney disease) stage 5, GFR less than 15 ml/min (HCC)   Volume overload   SOB (shortness of breath)   Hypertension   COPD (chronic obstructive pulmonary disease) (HCC)   Acute on chronic diastolic CHF - Strict in and out - Daily weight   Essential HTN      ESRD on HD T/TH/Sat  -Noncompliant with HD has missed 3 weeks of HD -7/10 per patient had HD today - 7/10 HD per nephrology: Would anticipate patient will need several more sessions given he has missed 3 weeks of HD  Anemia of chronic kidney disease (baseline HgB 8-10) Lab Results  Component Value Date   HGB 8.9 (L) 12/16/2020   HGB 8.4 (L) 12/15/2020   HGB 9.0 (L) 12/14/2020   HGB 9.3 (L) 12/12/2020   HGB 10.2 (L) 12/05/2020  -Within normal limits   Anasarca  -Per HD  COPD -Stable      Ulcer Pressure Injury 06/27/20 Buttocks Upper;Mid;Right Stage 2 -  Partial thickness loss of dermis presenting as a shallow open injury with a red, pink wound bed without slough. (Active)  06/27/20 0900  Location: Buttocks  Location Orientation: Upper;Mid;Right  Staging: Stage 2 -  Partial thickness loss of dermis presenting as a shallow open injury with a red, pink wound bed without slough.  Wound Description (Comments):   Present on Admission:    Goals of care - 7/10 palliative care consult:Patient with frequent noncompliance with HD.  Skip 3 weeks of hemodialysis per nephrology.  Evaluate for CODE STATUS changed  to DNR.  Advanced directives to include home with palliative care or hospice.   DVT prophylaxis: SCD Code Status: full Family Communication:  Status is: Inpatient    Dispo: The patient is from:               Anticipated d/c is to:               Anticipated d/c date is:               Patient currently       Consultants:    Procedures/Significant Events:    I have personally reviewed and interpreted all radiology studies and my findings are as above.  VENTILATOR SETTINGS:    Cultures   Antimicrobials:    Devices    LINES / TUBES:      Continuous Infusions:   Objective: Vitals:   12/16/20 0719 12/16/20 1345 12/16/20 1355 12/16/20 1400  BP: (!) 186/91 (!) 154/77 (!) 163/80 (!) 157/50  Pulse: 86 79 79 82  Resp: 17 18 (!) 26 (!) 23  Temp: 98.1 F (36.7 C) 97.9 F (36.6 C)    TempSrc: Oral Oral    SpO2: 92% 94%    Weight:  70.7 kg    Height:        Intake/Output  Summary (Last 24 hours) at 12/16/2020 1410 Last data filed at 12/16/2020 1235 Gross per 24 hour  Intake 750 ml  Output 300 ml  Net 450 ml    Filed Weights   12/15/20 0658 12/16/20 0449 12/16/20 1345  Weight: 77.6 kg 70.1 kg 70.7 kg    Examination:  General: A/O x4, No acute respiratory distress Eyes: negative scleral hemorrhage, negative anisocoria, negative icterus ENT: Negative Runny nose, negative gingival bleeding, Neck:  Negative scars, masses, torticollis, lymphadenopathy, JVD Lungs: Clear to auscultation bilaterally without wheezes or crackles Cardiovascular: Regular rate and rhythm without murmur gallop or rub normal S1 and S2 Abdomen: negative abdominal pain, nondistended, positive soft, bowel sounds, no rebound, no ascites, no appreciable mass Extremities: No significant cyanosis, clubbing, or edema bilateral lower extremities Skin: Negative rashes, lesions, ulcers Psychiatric:  Negative depression, negative anxiety, negative fatigue, negative mania EXTREMELY POOR  understanding of disease process/or LACK of caring about consequences Central nervous system:  Cranial nerves II through XII intact, tongue/uvula midline, all extremities muscle strength 5/5, sensation intact throughout, negative dysarthria, negative expressive aphasia, negative receptive aphasia.  .     Data Reviewed: Care during the described time interval was provided by me .  I have reviewed this patient's available data, including medical history, events of note, physical examination, and all test results as part of my evaluation.  CBC: Recent Labs  Lab 12/12/20 1011 12/14/20 2055 12/15/20 0548 12/16/20 0423  WBC 6.5 6.5 6.7 4.8  NEUTROABS 4.7  --   --  2.9  HGB 9.3* 9.0* 8.4* 8.9*  HCT 30.2* 29.5* 26.7* 27.5*  MCV 96.8 97.7 95.4 92.9  PLT 156 135* 125* 119*    Basic Metabolic Panel: Recent Labs  Lab 12/12/20 1011 12/14/20 2055 12/15/20 0548 12/16/20 0423  NA 142 142 142 139  K 4.4 4.3 4.4 3.3*  CL 108 109 111 102  CO2 11* 13* 12* 23  GLUCOSE 91 125* 92 89  BUN 97* 108* 114* 41*  CREATININE 20.09* 20.73* 20.18* 10.27*  CALCIUM 7.9* 8.0* 7.8* 8.0*  MG  --   --  3.6*  --   PHOS  --   --  >30.0* 8.8*    GFR: Estimated Creatinine Clearance: 6.8 mL/min (A) (by C-G formula based on SCr of 10.27 mg/dL (H)). Liver Function Tests: Recent Labs  Lab 12/12/20 1011 12/14/20 2055 12/15/20 0548 12/16/20 0423  AST 11* 11* 10*  --   ALT '10 10 9  '$ --   ALKPHOS 62 65 63  --   BILITOT 1.0 0.5 0.8  --   PROT 5.5* 5.7* 5.5*  --   ALBUMIN 2.8* 2.8* 2.7* 2.6*    Recent Labs  Lab 12/14/20 2055  LIPASE 47    No results for input(s): AMMONIA in the last 168 hours. Coagulation Profile: Recent Labs  Lab 12/15/20 0548  INR 1.1    Cardiac Enzymes: No results for input(s): CKTOTAL, CKMB, CKMBINDEX, TROPONINI in the last 168 hours. BNP (last 3 results) No results for input(s): PROBNP in the last 8760 hours. HbA1C: No results for input(s): HGBA1C in the last 72  hours. CBG: No results for input(s): GLUCAP in the last 168 hours. Lipid Profile: No results for input(s): CHOL, HDL, LDLCALC, TRIG, CHOLHDL, LDLDIRECT in the last 72 hours. Thyroid Function Tests: No results for input(s): TSH, T4TOTAL, FREET4, T3FREE, THYROIDAB in the last 72 hours. Anemia Panel: Recent Labs    12/16/20 0423  FERRITIN 205  TIBC 211*  IRON 53  Sepsis Labs: No results for input(s): PROCALCITON, LATICACIDVEN in the last 168 hours.  Recent Results (from the past 240 hour(s))  Resp Panel by RT-PCR (Flu A&B, Covid) Nasopharyngeal Swab     Status: None   Collection Time: 12/14/20 10:49 PM   Specimen: Nasopharyngeal Swab; Nasopharyngeal(NP) swabs in vial transport medium  Result Value Ref Range Status   SARS Coronavirus 2 by RT PCR NEGATIVE NEGATIVE Final    Comment: (NOTE) SARS-CoV-2 target nucleic acids are NOT DETECTED.  The SARS-CoV-2 RNA is generally detectable in upper respiratory specimens during the acute phase of infection. The lowest concentration of SARS-CoV-2 viral copies this assay can detect is 138 copies/mL. A negative result does not preclude SARS-Cov-2 infection and should not be used as the sole basis for treatment or other patient management decisions. A negative result may occur with  improper specimen collection/handling, submission of specimen other than nasopharyngeal swab, presence of viral mutation(s) within the areas targeted by this assay, and inadequate number of viral copies(<138 copies/mL). A negative result must be combined with clinical observations, patient history, and epidemiological information. The expected result is Negative.  Fact Sheet for Patients:  EntrepreneurPulse.com.au  Fact Sheet for Healthcare Providers:  IncredibleEmployment.be  This test is no t yet approved or cleared by the [place] FDA and  has been authorized for detection and/or diagnosis of SARS-CoV-2 by FDA under  an Emergency Use Authorization (EUA). This EUA will remain  in effect (meaning this test can be used) for the duration of the COVID-19 declaration under Section 564(b)(1) of the Act, 21 U.S.C.section 360bbb-3(b)(1), unless the authorization is terminated  or revoked sooner.       Influenza A by PCR NEGATIVE NEGATIVE Final   Influenza B by PCR NEGATIVE NEGATIVE Final    Comment: (NOTE) The Xpert Xpress SARS-CoV-2/FLU/RSV plus assay is intended as an aid in the diagnosis of influenza from Nasopharyngeal swab specimens and should not be used as a sole basis for treatment. Nasal washings and aspirates are unacceptable for Xpert Xpress SARS-CoV-2/FLU/RSV testing.  Fact Sheet for Patients: EntrepreneurPulse.com.au  Fact Sheet for Healthcare Providers: IncredibleEmployment.be  This test is not yet approved or cleared by the [place] FDA and has been authorized for detection and/or diagnosis of SARS-CoV-2 by FDA under an Emergency Use Authorization (EUA). This EUA will remain in effect (meaning this test can be used) for the duration of the COVID-19 declaration under Section 564(b)(1) of the Act, 21 U.S.C. section 360bbb-3(b)(1), unless the authorization is terminated or revoked.  Performed at [facility], [place] [address], [place], [place] [address]   MRSA Next Gen by PCR, Nasal     Status: Abnormal   Collection Time: 12/15/20  5:22 AM   Specimen: Nasal Mucosa; Nasal Swab  Result Value Ref Range Status   MRSA by PCR Next Gen DETECTED (A) NOT DETECTED Final    Comment: RESULT CALLED TO, READ BACK BY AND VERIFIED WITH: WALL, K RN 12/15/2020 AT 0704 SKEEN,P (NOTE) The GeneXpert MRSA Assay (FDA approved for NASAL specimens only), is one component of a comprehensive MRSA colonization surveillance program. It is not intended to diagnose MRSA infection nor to guide or monitor treatment for MRSA infections. Test performance is not FDA  approved in patients less than 37 years old. Performed at [facility], [place] [address], [place], [place] [address]           Radiology Studies: CT ABDOMEN PELVIS WO CONTRAST  Result Date: 12/14/2020 CLINICAL DATA:  65 year old male with abdominal pain. EXAM: CT ABDOMEN AND PELVIS WITHOUT CONTRAST TECHNIQUE: Multidetector CT imaging of the abdomen and pelvis was performed following the standard protocol without IV contrast. COMPARISON:  CT abdomen pelvis dated 12/05/2020. FINDINGS: Evaluation of this exam is limited in the absence of intravenous contrast. Lower chest: Partially visualized bilateral pleural effusions, right greater than left. There is diffuse interstitial and interlobular septal prominence most consistent with edema. There is mild cardiomegaly with coronary vascular calcification. No intra-abdominal free air. There is diffuse mesenteric edema and small ascites. Hepatobiliary: The liver is unremarkable. No intrahepatic biliary dilatation. High attenuating content within the gallbladder may represent sludge or small stones. Evaluation of the gallbladder is limited on this CT due to diffuse mesenteric edema and anasarca. Ultrasound may provide better evaluation if there is clinical concern for acute cholecystitis. Pancreas: The pancreas is unremarkable as visualized. Spleen: Normal in size without focal abnormality. Adrenals/Urinary Tract: The adrenal glands are unremarkable. Moderate bilateral renal parenchyma atrophy. There is no hydronephrosis or nephrolithiasis on either side. Renal vascular calcifications noted. The urinary bladder is minimally distended. High attenuating content within the bladder may represent excreted contrast from prior study or proteinaceous urine. Correlation with urinalysis recommended. Stomach/Bowel: There is sigmoid diverticulosis with muscular hypertrophy. No active inflammatory changes. There is no bowel obstruction. The appendix is normal.  Vascular/Lymphatic: Advanced aortoiliac atherosclerotic disease. Bilateral common iliac artery stents noted. The IVC is grossly unremarkable. No portal venous gas. There is no adenopathy. Reproductive: The prostate and seminal vesicles are grossly unremarkable. Other: Diffuse subcutaneous edema and anasarca and severe scrotal edema, worsened since the prior CT. No fluid collection. Musculoskeletal: Degenerative changes of the spine. No acute osseous pathology. IMPRESSION: 1. Interval worsening of the anasarca. 2. Sigmoid diverticulosis. No bowel obstruction. Normal appendix. 3. Probable sludge within the gallbladder. 4. No hydronephrosis or nephrolithiasis. High attenuating content within the bladder may represent excreted contrast from prior study or proteinaceous urine. Correlation with urinalysis recommended. 5. Aortic Atherosclerosis (ICD10-I70.0). Electronically Signed   By: Anner Crete M.D.   On: 12/14/2020 23:39   DG Chest Portable 1 View  Result Date: 12/14/2020 CLINICAL DATA:  65 year old male with shortness of breath. EXAM: PORTABLE CHEST 1 VIEW COMPARISON:  Chest radiograph dated 06/24/2020. FINDINGS: There is cardiomegaly with mild vascular congestion and probable mild edema. No focal consolidation or pneumothorax. Small left pleural effusion. No acute osseous pathology. IMPRESSION: Cardiomegaly with probable mild CHF and small left pleural effusion. Electronically Signed   By: Anner Crete M.D.   On: 12/14/2020 22:10        Scheduled Meds:  (feeding supplement) PROSource Plus  30 mL Oral TID BM   amLODipine  10 mg Oral q AM   calcium acetate  2,001 mg Oral TID WC   Chlorhexidine Gluconate Cloth  6 each Topical Q0600   COVID-19 mRNA Vac-TriS (Pfizer)  0.3 mL Intramuscular Once   [START ON 12/17/2020] darbepoetin (ARANESP) injection - DIALYSIS  200 mcg Intravenous Q Tue-HD   [START ON 12/17/2020] doxercalciferol  2 mcg Intravenous Q T,Th,Sa-HD   furosemide  40 mg Oral BID    metoprolol tartrate  25 mg Oral BID   Continuous Infusions:   LOS: 1 day    Time spent:40 min    Tecla Mailloux, Geraldo Docker, MD [facility]   If 7PM-7AM, please contact night-coverage 12/16/2020, 2:10 PM
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
## 2020-12-16 NOTE — Progress Notes (Signed)
Data charted in column in error-moved to correct time

## 2020-12-16 NOTE — Progress Notes (Signed)
Heart Failure Navigator Progress Note  Assessed for Heart & Vascular TOC clinic readiness.  Unfortunately at this time the patient does not meet criteria due to ESRD on HD TTS .   Navigator available for reassessment of patient.   Kerby Nora, PharmD, BCPS Heart Failure Stewardship Pharmacist Phone [phone]

## 2020-12-17 DIAGNOSIS — Z7189 Other specified counseling: Secondary | ICD-10-CM

## 2020-12-17 DIAGNOSIS — R1900 Intra-abdominal and pelvic swelling, mass and lump, unspecified site: Secondary | ICD-10-CM

## 2020-12-17 LAB — CBC
HCT: 29.6 % — ABNORMAL LOW (ref 39.0–52.0)
Hemoglobin: 9.4 g/dL — ABNORMAL LOW (ref 13.0–17.0)
MCH: 30.2 pg (ref 26.0–34.0)
MCHC: 31.8 g/dL (ref 30.0–36.0)
MCV: 95.2 fL (ref 80.0–100.0)
Platelets: 120 10*3/uL — ABNORMAL LOW (ref 150–400)
RBC: 3.11 MIL/uL — ABNORMAL LOW (ref 4.22–5.81)
RDW: 16.2 % — ABNORMAL HIGH (ref 11.5–15.5)
WBC: 5.7 10*3/uL (ref 4.0–10.5)
nRBC: 0 % (ref 0.0–0.2)

## 2020-12-17 LAB — RENAL FUNCTION PANEL
Albumin: 2.6 g/dL — ABNORMAL LOW (ref 3.5–5.0)
Anion gap: 10 (ref 5–15)
BUN: 35 mg/dL — ABNORMAL HIGH (ref 8–23)
CO2: 26 mmol/L (ref 22–32)
Calcium: 8.3 mg/dL — ABNORMAL LOW (ref 8.9–10.3)
Chloride: 104 mmol/L (ref 98–111)
Creatinine, Ser: 8.5 mg/dL — ABNORMAL HIGH (ref 0.61–1.24)
GFR, Estimated: 6 mL/min — ABNORMAL LOW (ref >60)
Glucose, Bld: 97 mg/dL (ref 70–99)
Phosphorus: 6.9 mg/dL — ABNORMAL HIGH (ref 2.5–4.6)
Potassium: 3.4 mmol/L — ABNORMAL LOW (ref 3.5–5.1)
Sodium: 140 mmol/L (ref 135–145)

## 2020-12-17 LAB — PARATHYROID HORMONE, INTACT (NO CA): PTH: 296 pg/mL — ABNORMAL HIGH (ref 15–65)

## 2020-12-17 MED ORDER — MUPIROCIN 2 % EX OINT
1.0000 "application " | TOPICAL_OINTMENT | Freq: Two times a day (BID) | CUTANEOUS | Status: DC
  Administered 2020-12-17 – 2020-12-19 (×4): 1 via NASAL
  Filled 2020-12-17: qty 22

## 2020-12-17 MED ORDER — SODIUM CHLORIDE 0.9 % IV SOLN: 100.0000 mL | INTRAVENOUS | Status: DC | PRN

## 2020-12-17 MED ORDER — COVID-19 MRNA VAC-TRIS(PFIZER) 30 MCG/0.3ML IM SUSP
0.3000 mL | Freq: Once | INTRAMUSCULAR | Status: DC
  Filled 2020-12-17 (×2): qty 0.3

## 2020-12-17 MED ORDER — POTASSIUM CHLORIDE CRYS ER 20 MEQ PO TBCR
40.0000 meq | EXTENDED_RELEASE_TABLET | Freq: Once | ORAL | Status: AC
  Administered 2020-12-17: 40 meq via ORAL
  Filled 2020-12-17: qty 2

## 2020-12-17 MED ORDER — LIDOCAINE HCL (PF) 1 % IJ SOLN: 5.0000 mL | INTRAMUSCULAR | Status: DC | PRN

## 2020-12-17 MED ORDER — PENTAFLUOROPROP-TETRAFLUOROETH EX AERO: 1.0000 "application " | INHALATION_SPRAY | CUTANEOUS | Status: DC | PRN

## 2020-12-17 MED ORDER — DARBEPOETIN ALFA 200 MCG/0.4ML IJ SOSY
PREFILLED_SYRINGE | INTRAMUSCULAR | Status: AC
  Administered 2020-12-17: 200 ug via INTRAVENOUS
  Filled 2020-12-17: qty 0.4

## 2020-12-17 MED ORDER — LIDOCAINE-PRILOCAINE 2.5-2.5 % EX CREA: 1.0000 "application " | TOPICAL_CREAM | CUTANEOUS | Status: DC | PRN

## 2020-12-17 MED ORDER — HEPARIN SODIUM (PORCINE) 1000 UNIT/ML DIALYSIS
1000.0000 [IU] | INTRAMUSCULAR | Status: DC | PRN
  Filled 2020-12-17 (×2): qty 1

## 2020-12-17 MED ORDER — DOXERCALCIFEROL 4 MCG/2ML IV SOLN
INTRAVENOUS | Status: AC
  Administered 2020-12-17: 2 ug via INTRAVENOUS
  Filled 2020-12-17: qty 2

## 2020-12-17 MED ORDER — ALTEPLASE 2 MG IJ SOLR: 2.0000 mg | Freq: Once | INTRAMUSCULAR | Status: DC | PRN

## 2020-12-17 MED ORDER — SODIUM CHLORIDE 0.9 % IV SOLN
100.0000 mg | INTRAVENOUS | Status: DC
  Administered 2020-12-17 – 2020-12-19 (×2): 100 mg via INTRAVENOUS
  Filled 2020-12-17 (×3): qty 5

## 2020-12-17 MED ORDER — CHLORHEXIDINE GLUCONATE CLOTH 2 % EX PADS
6.0000 | MEDICATED_PAD | Freq: Every day | CUTANEOUS | Status: DC
  Administered 2020-12-18: 6 via TOPICAL

## 2020-12-17 MED ORDER — DIPHENHYDRAMINE HCL 25 MG PO CAPS
ORAL_CAPSULE | ORAL | Status: AC
  Filled 2020-12-17: qty 1

## 2020-12-17 NOTE — Progress Notes (Signed)
Hemodialysis- Tolerated well. Had severe cramping in sides during last 10 minutes. Rinsed back early with 7 minutes remaining due to not responding to saline bolus. Patient is currently resting quietly, currently states cramping has subsided after rinseback. All vitals remained wnl throughout treatment. Reported off to 3E.

## 2020-12-17 NOTE — Progress Notes (Signed)
PROGRESS NOTE    Albert Harris  [id number] DOB: 1956-01-16 DOA: 12/14/2020 PCP: Kerin Perna, NP     Brief Narrative:   65 y.o. WM PMHx ESRD on HD T/TH/Sat , anemia of chronic kidney disease with baseline hemoglobin 8-10, COPD, chronic diastolic heart failure   Admitted to [facility] on 12/14/2020 with acute on chronic diastolic heart failure after presenting from home to [facility] ED complaining of abdominal distention.    The patient acknowledges a history of end-stage renal disease on hemodialysis on a proposed Tuesday, Thursday, Saturday schedule, although he conveys suboptimal compliance and maintaining this schedule, stating that he has been "unable to attend" any of his hemodialysis sessions over the last 3 weeks.  He reports that over the last 7 to 10 days, he has been experiencing progressive abdominal distention associated with progressive scrotal edema and worsening edema in the bilateral lower extremities.  He denies overt associated abdominal discomfort, no preceding trauma or travel.  He notes associated mild shortness of breath associated with orthopnea and PND.  He is unsure, quantitatively, about any recent weight gain.  Denies any recent calf tenderness or new onset lower extremity erythema. No recent chest pain, diaphoresis, palpitations, N/V, pre-syncope, or syncope. Not associated with any recent cough, wheezing, hemoptysis.    Denies any associated subjective fever, chills, rigors, or generalized myalgias. No recent headache, neck stiffness, rhinitis, rhinorrhea, sore throat, diarrhea, or rash. No known recent COVID-19 exposures. In spite of end-stage renal disease on HD, the patient reports that he does continue to produce a small amount of urine, and is any recent dysuria, gross hematuria, or change in urinary urgency/frequency.    In the setting of his recent progressive scrotal edema, he underwent scrotal ultrasound on 12/12/2020, which demonstrated diffuse scrotal  edema, small left hydrocele, and normal sonographic appearance of the bilateral testicles.   Medical history also notable for chronic diastolic heart failure, with most recent echocardiogram in February 2021 showing LVEF 60 to 65%, no evidence of focal elevation abnormalities, mild increase in left ventricular hypertrophy, grade 2 diastolic dysfunction, mild to moderately dilated left atrium, and no evidence of significant valvular pathology.  The patient acknowledges that his outpatient diuretic regimen consists of Lasix 40 mg p.o. twice daily.         ED Course:  Vital signs in the ED were notable for the following: Temperature max 97.5, heart rate 86-91; blood pressure 197/99; respiratory rate 18-22, oxygen saturation 99 to 100% on room air.   Labs were notable for the following: CMP was notable for following: Sodium 142, potassium 4.3, bicarbonate 13, anion gap 20, BUN 108, creatinine 20.73, albumin 2.8, but otherwise, liver enzymes were found to be within normal limits.  CBC notable for white cell count of 6500, hemoglobin 9.0 relative to most recent prior hemoglobin data point of 9.3 on 12/12/2020, platelets 135.  BNP greater than 4500.  Nasopharyngeal COVID-19/influenza PCR were checked in the ED today and found to be negative.   EKG, in comparison to most recent prior EKG performed on 12/06/2020, showed sinus rhythm, heart rate 88, QTc 476, nonspecific T wave inversion in aVL, which is unchanged from most recent prior EKG, and no evidence of ST changes, including no evidence of ST elevation.  Chest x-ray showed cardiomegaly with mild pulmonary edema and small left pleural effusion in the absence of any evidence of infiltrate, or pneumothorax.  CT abdomen/pelvis showed interval worsening of anasarca, but otherwise demonstrated no evidence of acute  intra-abdominal process.   [name] case, including vital signs, labs, and imaging were discussed with the on-call nephrologist, Dr. Candiss Norse, who will  formally consult, and plans for overnight hemodialysis.    While in the ED, the following were administered: None.   Subjective: 7/12 afebrile overnight, A/O x4 short HD session today secondary to cramping.   Assessment & Plan: Covid vaccination; negative vaccination.  Request to be vaccinated.  7/10 vaccination requested   Principal Problem:   Acute on chronic diastolic CHF (congestive heart failure) (HCC) Active Problems:   CKD (chronic kidney disease) stage 5, GFR less than 15 ml/min (HCC)   Volume overload   SOB (shortness of breath)   Hypertension   COPD (chronic obstructive pulmonary disease) (HCC)   Acute on chronic diastolic CHF - Strict in and out -9.9 L - Daily weight  Filed Weights   12/17/20 0003 12/17/20 0700 12/17/20 1010  Weight: 66.7 kg 67.2 kg 64.1 kg     Essential HTN -Controlled primarily through HD    ESRD on HD T/TH/Sat  -Noncompliant with HD has missed 3 weeks of HD -7/10 per patient had HD today - 7/10 HD per nephrology: Would anticipate patient will need several more sessions given he has missed 3 weeks of HD -7/12 patient had short session of HD today terminated secondary to cramping -Per nephrology note Next planned HD 7/14  Anemia of chronic kidney disease (baseline HgB 8-10) Lab Results  Component Value Date   HGB 9.4 (L) 12/17/2020   HGB 8.9 (L) 12/16/2020   HGB 8.4 (L) 12/15/2020   HGB 9.0 (L) 12/14/2020   HGB 9.3 (L) 12/12/2020  -Within normal limits   Anasarca  -Per HD  COPD -Stable     Hypokalemia - Potassium goal> 4 - 7/12 K-Dur 40 mEq x 1    Ulcer Pressure Injury 06/27/20 Buttocks Upper;Mid;Right Stage 2 -  Partial thickness loss of dermis presenting as a shallow open injury with a red, pink wound bed without slough. (Active)  06/27/20 0900  Location: Buttocks  Location Orientation: Upper;Mid;Right  Staging: Stage 2 -  Partial thickness loss of dermis presenting as a shallow open injury with a red, pink wound bed  without slough.  Wound Description (Comments):   Present on Admission:    Goals of care - 7/10 palliative care consult:Patient with frequent noncompliance with HD.  Skip 3 weeks of hemodialysis per nephrology.  Evaluate for CODE STATUS changed to DNR.  Advanced directives to include home with palliative care or hospice. -7/12 Per palliative care consult recommendations; -Full code/full scope of treatment -Patient is interested in outpatient palliative care for ongoing goals of care conversation with his wife, as she is unable to visit the hospital during his stay -Psychosocial and emotional support provided   DVT prophylaxis: SCD Code Status: full Family Communication:  Status is: Inpatient    Dispo: The patient is from: Home              Anticipated d/c is to: Home              Anticipated d/c date is: Per nephrology              Patient currently unstable      Consultants:  Nephrology  Procedures/Significant Events:    I have personally reviewed and interpreted all radiology studies and my findings are as above.  VENTILATOR SETTINGS:    Cultures   Antimicrobials:    Devices    LINES / TUBES:  Continuous Infusions:  iron sucrose       Objective: Vitals:   12/17/20 0928 12/17/20 1000 12/17/20 1010 12/17/20 1214  BP: (!) 191/81 (!) 130/100 (!) 154/74 (!) 163/83  Pulse: 80 89  83  Resp:   19 18  Temp:   97.6 F (36.4 C) 98.2 F (36.8 C)  TempSrc:   Oral Oral  SpO2:   96% 91%  Weight:   64.1 kg   Height:        Intake/Output Summary (Last 24 hours) at 12/17/2020 1304 Last data filed at 12/17/2020 1020 Gross per 24 hour  Intake 835 ml  Output 7100 ml  Net -6265 ml    Filed Weights   12/17/20 0003 12/17/20 0700 12/17/20 1010  Weight: 66.7 kg 67.2 kg 64.1 kg    Examination:  General: A/O x4, No acute respiratory distress Eyes: negative scleral hemorrhage, negative anisocoria, negative icterus ENT: Negative Runny nose, negative  gingival bleeding, Neck:  Negative scars, masses, torticollis, lymphadenopathy, JVD Lungs: Clear to auscultation bilaterally without wheezes or crackles Cardiovascular: Regular rate and rhythm without murmur gallop or rub normal S1 and S2 Abdomen: negative abdominal pain, nondistended, positive soft, bowel sounds, no rebound, no ascites, no appreciable mass Extremities: No significant cyanosis, clubbing, or edema bilateral lower extremities Skin: Negative rashes, lesions, ulcers Psychiatric:  Negative depression, negative anxiety, negative fatigue, negative mania EXTREMELY POOR understanding of disease process/or LACK of caring about consequences Central nervous system:  Cranial nerves II through XII intact, tongue/uvula midline, all extremities muscle strength 5/5, sensation intact throughout, negative dysarthria, negative expressive aphasia, negative receptive aphasia.  .     Data Reviewed: Care during the described time interval was provided by me .  I have reviewed this patient's available data, including medical history, events of note, physical examination, and all test results as part of my evaluation.  CBC: Recent Labs  Lab 12/12/20 1011 12/14/20 2055 12/15/20 0548 12/16/20 0423 12/17/20 0646  WBC 6.5 6.5 6.7 4.8 5.7  NEUTROABS 4.7  --   --  2.9  --   HGB 9.3* 9.0* 8.4* 8.9* 9.4*  HCT 30.2* 29.5* 26.7* 27.5* 29.6*  MCV 96.8 97.7 95.4 92.9 95.2  PLT 156 135* 125* 119* 120*    Basic Metabolic Panel: Recent Labs  Lab 12/12/20 1011 12/14/20 2055 12/15/20 0548 12/16/20 0423 12/17/20 0646  NA 142 142 142 139 140  K 4.4 4.3 4.4 3.3* 3.4*  CL 108 109 111 102 104  CO2 11* 13* 12* 23 26  GLUCOSE 91 125* 92 89 97  BUN 97* 108* 114* 41* 35*  CREATININE 20.09* 20.73* 20.18* 10.27* 8.50*  CALCIUM 7.9* 8.0* 7.8* 8.0* 8.3*  MG  --   --  3.6*  --   --   PHOS  --   --  >30.0* 8.8* 6.9*    GFR: Estimated Creatinine Clearance: 7.8 mL/min (A) (by C-G formula based on SCr of  8.5 mg/dL (H)). Liver Function Tests: Recent Labs  Lab 12/12/20 1011 12/14/20 2055 12/15/20 0548 12/16/20 0423 12/17/20 0646  AST 11* 11* 10*  --   --   ALT '10 10 9  '$ --   --   ALKPHOS 62 65 63  --   --   BILITOT 1.0 0.5 0.8  --   --   PROT 5.5* 5.7* 5.5*  --   --   ALBUMIN 2.8* 2.8* 2.7* 2.6* 2.6*    Recent Labs  Lab 12/14/20 2055  LIPASE 47  No results for input(s): AMMONIA in the last 168 hours. Coagulation Profile: Recent Labs  Lab 12/15/20 0548  INR 1.1    Cardiac Enzymes: No results for input(s): CKTOTAL, CKMB, CKMBINDEX, TROPONINI in the last 168 hours. BNP (last 3 results) No results for input(s): PROBNP in the last 8760 hours. HbA1C: No results for input(s): HGBA1C in the last 72 hours. CBG: No results for input(s): GLUCAP in the last 168 hours. Lipid Profile: No results for input(s): CHOL, HDL, LDLCALC, TRIG, CHOLHDL, LDLDIRECT in the last 72 hours. Thyroid Function Tests: No results for input(s): TSH, T4TOTAL, FREET4, T3FREE, THYROIDAB in the last 72 hours. Anemia Panel: Recent Labs    12/16/20 0423  FERRITIN 205  TIBC 211*  IRON 53    Sepsis Labs: No results for input(s): PROCALCITON, LATICACIDVEN in the last 168 hours.  Recent Results (from the past 240 hour(s))  Resp Panel by RT-PCR (Flu A&B, Covid) Nasopharyngeal Swab     Status: None   Collection Time: 12/14/20 10:49 PM   Specimen: Nasopharyngeal Swab; Nasopharyngeal(NP) swabs in vial transport medium  Result Value Ref Range Status   SARS Coronavirus 2 by RT PCR NEGATIVE NEGATIVE Final    Comment: (NOTE) SARS-CoV-2 target nucleic acids are NOT DETECTED.  The SARS-CoV-2 RNA is generally detectable in upper respiratory specimens during the acute phase of infection. The lowest concentration of SARS-CoV-2 viral copies this assay can detect is 138 copies/mL. A negative result does not preclude SARS-Cov-2 infection and should not be used as the sole basis for treatment or other patient  management decisions. A negative result may occur with  improper specimen collection/handling, submission of specimen other than nasopharyngeal swab, presence of viral mutation(s) within the areas targeted by this assay, and inadequate number of viral copies(<138 copies/mL). A negative result must be combined with clinical observations, patient history, and epidemiological information. The expected result is Negative.  Fact Sheet for Patients:  EntrepreneurPulse.com.au  Fact Sheet for Healthcare Providers:  IncredibleEmployment.be  This test is no t yet approved or cleared by the [place] FDA and  has been authorized for detection and/or diagnosis of SARS-CoV-2 by FDA under an Emergency Use Authorization (EUA). This EUA will remain  in effect (meaning this test can be used) for the duration of the COVID-19 declaration under Section 564(b)(1) of the Act, 21 U.S.C.section 360bbb-3(b)(1), unless the authorization is terminated  or revoked sooner.       Influenza A by PCR NEGATIVE NEGATIVE Final   Influenza B by PCR NEGATIVE NEGATIVE Final    Comment: (NOTE) The Xpert Xpress SARS-CoV-2/FLU/RSV plus assay is intended as an aid in the diagnosis of influenza from Nasopharyngeal swab specimens and should not be used as a sole basis for treatment. Nasal washings and aspirates are unacceptable for Xpert Xpress SARS-CoV-2/FLU/RSV testing.  Fact Sheet for Patients: EntrepreneurPulse.com.au  Fact Sheet for Healthcare Providers: IncredibleEmployment.be  This test is not yet approved or cleared by the [place] FDA and has been authorized for detection and/or diagnosis of SARS-CoV-2 by FDA under an Emergency Use Authorization (EUA). This EUA will remain in effect (meaning this test can be used) for the duration of the COVID-19 declaration under Section 564(b)(1) of the Act, 21 U.S.C. section 360bbb-3(b)(1),  unless the authorization is terminated or revoked.  Performed at [facility], [place] [address] [place], [place] [address]   MRSA Next Gen by PCR, Nasal     Status: Abnormal   Collection Time: 12/15/20  5:22 AM  Specimen: Nasal Mucosa; Nasal Swab  Result Value Ref Range Status   MRSA by PCR Next Gen DETECTED (A) NOT DETECTED Final    Comment: RESULT CALLED TO, READ BACK BY AND VERIFIED WITH: WALL, K RN 12/15/2020 AT 0704 SKEEN,P (NOTE) The GeneXpert MRSA Assay (FDA approved for NASAL specimens only), is one component of a comprehensive MRSA colonization surveillance program. It is not intended to diagnose MRSA infection nor to guide or monitor treatment for MRSA infections. Test performance is not FDA approved in patients less than 57 years old. Performed at [facility], [place] [address] [place], [place] [address]           Radiology Studies: No results found.      Scheduled Meds:  (feeding supplement) PROSource Plus  30 mL Oral TID BM   amLODipine  10 mg Oral q AM   calcium acetate  2,001 mg Oral TID WC   Chlorhexidine Gluconate Cloth  6 each Topical Q0600   COVID-19 mRNA Vac-TriS (Pfizer)  0.3 mL Intramuscular Once   darbepoetin (ARANESP) injection - DIALYSIS  200 mcg Intravenous Q Tue-HD   doxercalciferol  2 mcg Intravenous Q T,Th,Sa-HD   furosemide  40 mg Oral BID   metoprolol tartrate  25 mg Oral BID   Continuous Infusions:  iron sucrose       LOS: 2 days    Time spent:40 min    Meliton Samad, Geraldo Docker, MD [facility]   If 7PM-7AM, please contact night-coverage 12/17/2020, 1:04 PM

## 2020-12-17 NOTE — Progress Notes (Signed)
[facility] Progress Note   Subjective:   Patient seen and examined during dialysis.  Tolerating treatment well so far.  Minor cramping in his side about 1 hour in that resolved on its on.  Denies CP, n/v/d, abdominal pain, weakness and fatigue.  Continues to have SOB, although it is improved.  He feels like his swelling has improved.   Objective Vitals:   12/17/20 0900 12/17/20 0928 12/17/20 1000 12/17/20 1010  BP: (!) 167/84 (!) 191/81 (!) 130/100 (!) 154/74  Pulse: 79 80 89   Resp:    19  Temp:    97.6 F (36.4 C)  TempSrc:    Oral  SpO2:    96%  Weight:    64.1 kg  Height:       Physical Exam General: ill appearing male in NAD Heart:RRR, no mrg appreciated Lungs:+scattered wheeze b/l, nml WOB on RA Abdomen:soft, NTND Extremities:trace LE edema b/l  Dialysis Access: RU AVG in use   Filed Weights   12/17/20 0003 12/17/20 0700 12/17/20 1010  Weight: 66.7 kg 67.2 kg 64.1 kg    Intake/Output Summary (Last 24 hours) at 12/17/2020 1142 Last data filed at 12/17/2020 1020 Gross per 24 hour  Intake 925 ml  Output 7100 ml  Net -6175 ml    Additional Objective Labs: Basic Metabolic Panel: Recent Labs  Lab 12/15/20 0548 12/16/20 0423 12/17/20 0646  NA 142 139 140  K 4.4 3.3* 3.4*  CL 111 102 104  CO2 12* 23 26  GLUCOSE 92 89 97  BUN 114* 41* 35*  CREATININE 20.18* 10.27* 8.50*  CALCIUM 7.8* 8.0* 8.3*  PHOS >30.0* 8.8* 6.9*   Liver Function Tests: Recent Labs  Lab 12/12/20 1011 12/14/20 2055 12/15/20 0548 12/16/20 0423 12/17/20 0646  AST 11* 11* 10*  --   --   ALT '10 10 9  '$ --   --   ALKPHOS 62 65 63  --   --   BILITOT 1.0 0.5 0.8  --   --   PROT 5.5* 5.7* 5.5*  --   --   ALBUMIN 2.8* 2.8* 2.7* 2.6* 2.6*   Recent Labs  Lab 12/14/20 2055  LIPASE 47   CBC: Recent Labs  Lab 12/12/20 1011 12/14/20 2055 12/15/20 0548 12/16/20 0423 12/17/20 0646  WBC 6.5 6.5 6.7 4.8 5.7  NEUTROABS 4.7  --   --  2.9  --   HGB 9.3* 9.0* 8.4* 8.9* 9.4*   HCT 30.2* 29.5* 26.7* 27.5* 29.6*  MCV 96.8 97.7 95.4 92.9 95.2  PLT 156 135* 125* 119* 120*   Blood Culture    Component Value Date/Time   SDES BLOOD RIGHT HAND 06/18/2020 2221   SPECREQUEST  06/18/2020 2221    BOTTLES DRAWN AEROBIC AND ANAEROBIC Blood Culture adequate volume   CULT  06/18/2020 2221    NO GROWTH 5 DAYS Performed at [facility], [address] [place], [place] [address]    REPTSTATUS 06/23/2020 FINAL 06/18/2020 2221    Iron Studies:  Recent Labs    12/16/20 0423  IRON 53  TIBC 211*  FERRITIN 205   Lab Results  Component Value Date   INR 1.1 12/15/2020   INR 1.2 06/16/2020   INR 0.88 11/09/2017   Studies/Results: No results found.  Medications:  sodium chloride     sodium chloride      (feeding supplement) PROSource Plus  30 mL Oral TID BM   amLODipine  10 mg Oral q AM  calcium acetate  2,001 mg Oral TID WC   Chlorhexidine Gluconate Cloth  6 each Topical Q0600   COVID-19 mRNA Vac-TriS (Pfizer)  0.3 mL Intramuscular Once   darbepoetin (ARANESP) injection - DIALYSIS  200 mcg Intravenous Q Tue-HD   doxercalciferol  2 mcg Intravenous Q T,Th,Sa-HD   furosemide  40 mg Oral BID   metoprolol tartrate  25 mg Oral BID    Dialysis Orders: TTS - [facility] ([facility])  4hrs, BFR 400, DFR 800,  EDW 62kg, 2K/ 2Ca   Heparin: No Systemic Heparin Mircera 225 mcg q2wks - last 10/29/20 Hectorol 69mg (dose increased) IV qHD - 322m last given 11/23/20 Venofer: previously ordered load X 10 doses but only received 2 doses 6/11 and 6/14 Calcium Acetate 2 capsules TID with meals   Assessment/Plan: Acute on chronic diastolic heart failure: in setting of missing 3 weeks of HD. Continue lasix and volume removal with HD. ESRD - on HD TTS: Hx non compliance with missed HD x 3weeks.  Serial HD ordered with HD 7/10, 7/11 and today. K 3.4, use increased K bath.  Next HD on 7/14. Hypertension/volume  - BPs elevated in the setting of volume overload,  improving with HD. Continue Metoprolol, Amlodipine, and Lasix. 11L of fluid removed over the last 3 days, left 2L over dry weight today.  Volume status greatly improved.  Anemia of CKD - Hgb 8.9-on max ESA as outpatient but missed dose. Iron load started as OP but only received 2 doses, continue iron load here, tsat 25%, ferritin 205.  Secondary Hyperparathyroidism -  Corr Ca 8.8; PO4 getting closer to goal. Continue VDRA and binders.   Nutrition - Renal diet w/fluid restrictions. Albumin low 2.6-will add protein supplements  LiJen MowPA-C CaEmma/05/2021,11:42 AM  LOS: 2 days

## 2020-12-18 LAB — CBC
HCT: 33.6 % — ABNORMAL LOW (ref 39.0–52.0)
Hemoglobin: 10.4 g/dL — ABNORMAL LOW (ref 13.0–17.0)
MCH: 30.1 pg (ref 26.0–34.0)
MCHC: 31 g/dL (ref 30.0–36.0)
MCV: 97.1 fL (ref 80.0–100.0)
Platelets: 135 10*3/uL — ABNORMAL LOW (ref 150–400)
RBC: 3.46 MIL/uL — ABNORMAL LOW (ref 4.22–5.81)
RDW: 16.4 % — ABNORMAL HIGH (ref 11.5–15.5)
WBC: 6.6 10*3/uL (ref 4.0–10.5)
nRBC: 0 % (ref 0.0–0.2)

## 2020-12-18 LAB — COMPREHENSIVE METABOLIC PANEL
ALT: 9 U/L (ref 0–44)
AST: 14 U/L — ABNORMAL LOW (ref 15–41)
Albumin: 2.7 g/dL — ABNORMAL LOW (ref 3.5–5.0)
Alkaline Phosphatase: 57 U/L (ref 38–126)
Anion gap: 6 (ref 5–15)
BUN: 28 mg/dL — ABNORMAL HIGH (ref 8–23)
CO2: 28 mmol/L (ref 22–32)
Calcium: 8.7 mg/dL — ABNORMAL LOW (ref 8.9–10.3)
Chloride: 105 mmol/L (ref 98–111)
Creatinine, Ser: 6.18 mg/dL — ABNORMAL HIGH (ref 0.61–1.24)
GFR, Estimated: 9 mL/min — ABNORMAL LOW (ref >60)
Glucose, Bld: 122 mg/dL — ABNORMAL HIGH (ref 70–99)
Potassium: 4.1 mmol/L (ref 3.5–5.1)
Sodium: 139 mmol/L (ref 135–145)
Total Bilirubin: 0.7 mg/dL (ref 0.3–1.2)
Total Protein: 5.8 g/dL — ABNORMAL LOW (ref 6.5–8.1)

## 2020-12-18 LAB — PHOSPHORUS: Phosphorus: 4.4 mg/dL (ref 2.5–4.6)

## 2020-12-18 LAB — MAGNESIUM: Magnesium: 2.4 mg/dL (ref 1.7–2.4)

## 2020-12-18 MED ORDER — HYDRALAZINE HCL 50 MG PO TABS
50.0000 mg | ORAL_TABLET | Freq: Three times a day (TID) | ORAL | Status: DC
  Administered 2020-12-18 – 2020-12-19 (×2): 50 mg via ORAL
  Filled 2020-12-18 (×2): qty 1

## 2020-12-18 MED ORDER — HYDRALAZINE HCL 20 MG/ML IJ SOLN
10.0000 mg | Freq: Once | INTRAMUSCULAR | Status: AC
  Administered 2020-12-18: 10 mg via INTRAVENOUS
  Filled 2020-12-18: qty 1

## 2020-12-18 MED ORDER — CHLORHEXIDINE GLUCONATE CLOTH 2 % EX PADS
6.0000 | MEDICATED_PAD | Freq: Every day | CUTANEOUS | Status: DC
  Administered 2020-12-19: 6 via TOPICAL

## 2020-12-18 MED ORDER — HYDRALAZINE HCL 20 MG/ML IJ SOLN: 10.0000 mg | Freq: Four times a day (QID) | INTRAMUSCULAR | Status: DC | PRN

## 2020-12-18 NOTE — Progress Notes (Addendum)
PROGRESS NOTE    Albert Harris  [id number] DOB: [birth year] DOA: 12/14/2020 PCP: Kerin Perna, NP    Brief Narrative:  Albert Harris was admitted to the hospital with a working diagnosis of acute on chronic diastolic heart failure exacerbation.  65 year old male past medical history for end-stage renal disease on hemodialysis, COPD and chronic diastolic heart failure who presented abdominal distention.  Patient has been noncompliant with outpatient hemodialysis treatments.  Reported 7 to 10 days of progressive abdominal distention associated with scrotal edema and lower extremity edema.  On his initial physical examination temperature 97.5, heart rate 86, blood pressure 197/99, respiratory rate 18, oxygen saturation 99%.  His lungs have bibasilar Rales, heart S1-S2, present, rhythmic, soft abdomen, mildly distended, positive extremity edema.  Sodium 142, potassium 4.3, chloride 109, bicarb 13, glucose 125, BUN 108, creatinine 20.73, BNP 4500, white count 6.5, hemoglobin 9.0, hematocrit 39.5, platelets 135. SARS COVID-19 negative.  Patient underwent hemodialysis with improvement of his symptoms.  Assessment & Plan:   Principal Problem:   Acute on chronic diastolic CHF (congestive heart failure) (HCC) Active Problems:   CKD (chronic kidney disease) stage 5, GFR less than 15 ml/min (HCC)   Volume overload   SOB (shortness of breath)   Hypertension   COPD (chronic obstructive pulmonary disease) (HCC)   Goals of care, counseling/discussion   Abdominal swelling   Acute on chronic diastolic heart failure due to volume overload related to ESRD. Hypertensive emergency Patient continue to have elevated blood pressure, but not edema or dyspnea. Continue with aggressive ultrafiltration on HD.   Continue blood pressure control with amlodipine and metoprolol. Will add hydralazine tid scheduled and as needed.  Continue oxymetry monitoring.   2. COPD. No signs of exacerbation, continue  with as needed bronchodilator.  3. Hypokalemia. K is 4,1 today, will continue follow up electrolytes.  Continue with furosemide    Status is: Inpatient  Remains inpatient appropriate because:Inpatient level of care appropriate due to severity of illness  Dispo: The patient is from: Home              Anticipated d/c is to: Home              Patient currently is not medically stable to d/c.   Difficult to place patient No   DVT prophylaxis: Heparin   Code Status:   full  Family Communication:  No family at the bedside       Consultants:  Nephrology    Subjective: Patient continue to have elevated blood pressure but not dyspnea or chest pain.   Objective: Vitals:   12/18/20 0753 12/18/20 0950 12/18/20 1104 12/18/20 1547  BP: (!) 162/111 (!) 160/79 (!) 169/91 (!) 159/100  Pulse:  84 79 85  Resp:   18 18  Temp:   98.4 F (36.9 C) 97.6 F (36.4 C)  TempSrc:   Oral Oral  SpO2:   98% 99%  Weight:      Height:        Intake/Output Summary (Last 24 hours) at 12/18/2020 1622 Last data filed at 12/18/2020 1300 Gross per 24 hour  Intake 752.7 ml  Output --  Net 752.7 ml   Filed Weights   12/17/20 0700 12/17/20 1010 12/18/20 0327  Weight: 67.2 kg 64.1 kg 64.2 kg    Examination:   General: Not in pain or dyspnea., deconditioned  Neurology: Awake and alert, non focal  E ENT: no pallor, no icterus, oral mucosa moist Cardiovascular: No JVD. S1-S2 present,  rhythmic, no gallops, rubs, or murmurs. Trace lower extremity edema. Pulmonary: positive breath sounds bilaterally, adequate air movement, no wheezing, rhonchi or rales. Gastrointestinal. Abdomen soft and non tender Skin. No rashes Musculoskeletal: no joint deformities     Data Reviewed: I have personally reviewed following labs and imaging studies  CBC: Recent Labs  Lab 12/12/20 1011 12/14/20 2055 12/15/20 0548 12/16/20 0423 12/17/20 0646 12/18/20 0408  WBC 6.5 6.5 6.7 4.8 5.7 6.6  NEUTROABS 4.7  --    --  2.9  --   --   HGB 9.3* 9.0* 8.4* 8.9* 9.4* 10.4*  HCT 30.2* 29.5* 26.7* 27.5* 29.6* 33.6*  MCV 96.8 97.7 95.4 92.9 95.2 97.1  PLT 156 135* 125* 119* 120* [id number]*   Basic Metabolic Panel: Recent Labs  Lab 12/14/20 2055 12/15/20 0548 12/16/20 0423 12/17/20 0646 12/18/20 0408  NA 142 142 139 140 139  K 4.3 4.4 3.3* 3.4* 4.1  CL 109 111 102 104 105  CO2 13* 12* '23 26 28  '$ GLUCOSE 125* 92 89 97 122*  BUN 108* 114* 41* 35* 28*  CREATININE 20.73* 20.18* 10.27* 8.50* 6.18*  CALCIUM 8.0* 7.8* 8.0* 8.3* 8.7*  MG  --  3.6*  --   --  2.4  PHOS  --  >30.0* 8.8* 6.9* 4.4   GFR: Estimated Creatinine Clearance: 10.7 mL/min (A) (by C-G formula based on SCr of 6.18 mg/dL (H)). Liver Function Tests: Recent Labs  Lab 12/12/20 1011 12/14/20 2055 12/15/20 0548 12/16/20 0423 12/17/20 0646 12/18/20 0408  AST 11* 11* 10*  --   --  14*  ALT '10 10 9  '$ --   --  9  ALKPHOS 62 65 63  --   --  57  BILITOT 1.0 0.5 0.8  --   --  0.7  PROT 5.5* 5.7* 5.5*  --   --  5.8*  ALBUMIN 2.8* 2.8* 2.7* 2.6* 2.6* 2.7*   Recent Labs  Lab 12/14/20 2055  LIPASE 47   No results for input(s): AMMONIA in the last 168 hours. Coagulation Profile: Recent Labs  Lab 12/15/20 0548  INR 1.1   Cardiac Enzymes: No results for input(s): CKTOTAL, CKMB, CKMBINDEX, TROPONINI in the last 168 hours. BNP (last 3 results) No results for input(s): PROBNP in the last 8760 hours. HbA1C: No results for input(s): HGBA1C in the last 72 hours. CBG: No results for input(s): GLUCAP in the last 168 hours. Lipid Profile: No results for input(s): CHOL, HDL, LDLCALC, TRIG, CHOLHDL, LDLDIRECT in the last 72 hours. Thyroid Function Tests: No results for input(s): TSH, T4TOTAL, FREET4, T3FREE, THYROIDAB in the last 72 hours. Anemia Panel: Recent Labs    12/16/20 0423  FERRITIN 205  TIBC 211*  IRON 33      Radiology Studies: I have reviewed all of the imaging during this hospital visit personally     Scheduled  Meds:  (feeding supplement) PROSource Plus  30 mL Oral TID BM   amLODipine  10 mg Oral q AM   calcium acetate  2,001 mg Oral TID WC   Chlorhexidine Gluconate Cloth  6 each Topical Q0600   [START ON 12/19/2020] Chlorhexidine Gluconate Cloth  6 each Topical Q0600   COVID-19 mRNA Vac-TriS (Pfizer)  0.3 mL Intramuscular Once   darbepoetin (ARANESP) injection - DIALYSIS  200 mcg Intravenous Q Tue-HD   doxercalciferol  2 mcg Intravenous Q T,Th,Sa-HD   furosemide  40 mg Oral BID   metoprolol tartrate  25 mg Oral BID   mupirocin  ointment  1 application Nasal BID   Continuous Infusions:  iron sucrose 100 mg (12/17/20 1552)     LOS: 3 days        Matheau Orona Gerome Apley, MD

## 2020-12-18 NOTE — Progress Notes (Signed)
Patient's BP has been sustaining 160-170s all shift despite given all scheduled medications. Patient has been asymptomatic all day. MD at bedside and made aware. New orders to give 50 mg PO hydralazine TID and IV hydralazine prn. Will continue to monitor for changes.

## 2020-12-18 NOTE — Progress Notes (Addendum)
[facility] Progress Note   Subjective:   Patient seen and examined at bedside.  Reports feeling much better. Breathing back to normal.  Feels like he is back to baseline.  States "I learned by lesson about missing dialysis."  Denies CP, SOB, n/v/d, abdominal pain, weakness, dizziness and fatigue.   Objective Vitals:   12/18/20 0732 12/18/20 0753 12/18/20 0950 12/18/20 1104  BP: (!) 173/77 (!) 162/111 (!) 160/79 (!) 169/91  Pulse: 76  84 79  Resp: 18   18  Temp: 98.2 F (36.8 C)   98.4 F (36.9 C)  TempSrc: Oral   Oral  SpO2: 98%   98%  Weight:      Height:       Physical Exam General:well appearing male in NAD, sitting on edge of bed Heart:RRR, no mrg Lungs:CTAB, nml WOB on RA Abdomen:soft, NTND Extremities:no LE edema Dialysis Access: RU AVG +b/t   Filed Weights   12/17/20 0700 12/17/20 1010 12/18/20 0327  Weight: 67.2 kg 64.1 kg 64.2 kg    Intake/Output Summary (Last 24 hours) at 12/18/2020 1439 Last data filed at 12/18/2020 1300 Gross per 24 hour  Intake 1002.7 ml  Output --  Net 1002.7 ml    Additional Objective Labs: Basic Metabolic Panel: Recent Labs  Lab 12/16/20 0423 12/17/20 0646 12/18/20 0408  NA 139 140 139  K 3.3* 3.4* 4.1  CL 102 104 105  CO2 '23 26 28  '$ GLUCOSE 89 97 122*  BUN 41* 35* 28*  CREATININE 10.27* 8.50* 6.18*  CALCIUM 8.0* 8.3* 8.7*  PHOS 8.8* 6.9* 4.4   Liver Function Tests: Recent Labs  Lab 12/14/20 2055 12/15/20 0548 12/16/20 0423 12/17/20 0646 12/18/20 0408  AST 11* 10*  --   --  14*  ALT 10 9  --   --  9  ALKPHOS 65 63  --   --  57  BILITOT 0.5 0.8  --   --  0.7  PROT 5.7* 5.5*  --   --  5.8*  ALBUMIN 2.8* 2.7* 2.6* 2.6* 2.7*   Recent Labs  Lab 12/14/20 2055  LIPASE 47   CBC: Recent Labs  Lab 12/12/20 1011 12/14/20 2055 12/15/20 0548 12/16/20 0423 12/17/20 0646 12/18/20 0408  WBC 6.5 6.5 6.7 4.8 5.7 6.6  NEUTROABS 4.7  --   --  2.9  --   --   HGB 9.3* 9.0* 8.4* 8.9* 9.4* 10.4*  HCT  30.2* 29.5* 26.7* 27.5* 29.6* 33.6*  MCV 96.8 97.7 95.4 92.9 95.2 97.1  PLT 156 135* 125* 119* 120* 135*    Iron Studies:  Recent Labs    12/16/20 0423  IRON 53  TIBC 211*  FERRITIN 205   Lab Results  Component Value Date   INR 1.1 12/15/2020   INR 1.2 06/16/2020   INR 0.88 11/09/2017   Studies/Results: No results found.  Medications:  iron sucrose 100 mg (12/17/20 1552)    (feeding supplement) PROSource Plus  30 mL Oral TID BM   amLODipine  10 mg Oral q AM   calcium acetate  2,001 mg Oral TID WC   Chlorhexidine Gluconate Cloth  6 each Topical Q0600   Chlorhexidine Gluconate Cloth  6 each Topical Q0600   COVID-19 mRNA Vac-TriS (Pfizer)  0.3 mL Intramuscular Once   darbepoetin (ARANESP) injection - DIALYSIS  200 mcg Intravenous Q Tue-HD   doxercalciferol  2 mcg Intravenous Q T,Th,Sa-HD   furosemide  40 mg Oral BID   metoprolol tartrate  25  mg Oral BID   mupirocin ointment  1 application Nasal BID    Dialysis Orders: TTS - [place] ([place])  4hrs, BFR 400, DFR 800,  EDW 62kg, 2K/ 2Ca   Heparin: No Systemic Heparin Mircera 225 mcg q2wks - last 10/29/20 Hectorol 36mg (dose increased) IV qHD - 342m last given 11/23/20 Venofer: previously ordered load X 10 doses but only received 2 doses 6/11 and 6/14 Calcium Acetate 2 capsules TID with meals   Assessment/Plan: Acute on chronic diastolic heart failure: in setting of missing 3 weeks of HD. Volume status improved.  ESRD - on HD TTS: Hx non compliance with missed HD x 3weeks.  Serial HD ordered with HD 7/10, 7/11 and today. K 3.4, use increased K bath.  Next HD on 7/14, can be completed at OP if discharged.  Discussed importance of compliance with HD.  Hypertension/volume  - BPs elevated this AM. Continue Metoprolol, Amlodipine, and Lasix. May need to increase meds if not improved once at dry weight.  Can be adjusted as outpatient. 11L of fluid removed over the last 3 days, left 2L over dry weight  yesterday. Volume status greatly improved. Anemia of CKD - Hgb improved to 10.4. on max ESA as outpatient but missed dose. Iron load started as OP but only received 2 doses, continue iron load here, tsat 25%, ferritin 205. Given ESA with HD yesterday.  Secondary Hyperparathyroidism -  Corr Ca and phos now in goal. Continue VDRA and binders.   Nutrition - Renal diet w/fluid restrictions. Albumin low 2.6-will add protein supplements Dispo - ok for d/c from renal standpoint.     LiJen MowPA-C [facility] 12/18/2020,2:39 PM  LOS: 3 days   Pt seen, examined and agree w A/P as above.  RoKelly SplinterMD 12/18/2020, 3:56 PM

## 2020-12-19 ENCOUNTER — Other Ambulatory Visit: Payer: Self-pay

## 2020-12-19 ENCOUNTER — Encounter (HOSPITAL_COMMUNITY): Payer: Self-pay

## 2020-12-19 LAB — BASIC METABOLIC PANEL
Anion gap: 10 (ref 5–15)
BUN: 44 mg/dL — ABNORMAL HIGH (ref 8–23)
CO2: 25 mmol/L (ref 22–32)
Calcium: 9.6 mg/dL (ref 8.9–10.3)
Chloride: 103 mmol/L (ref 98–111)
Creatinine, Ser: 8.21 mg/dL — ABNORMAL HIGH (ref 0.61–1.24)
GFR, Estimated: 7 mL/min — ABNORMAL LOW (ref >60)
Glucose, Bld: 92 mg/dL (ref 70–99)
Potassium: 3.9 mmol/L (ref 3.5–5.1)
Sodium: 138 mmol/L (ref 135–145)

## 2020-12-19 MED ORDER — DOXERCALCIFEROL 4 MCG/2ML IV SOLN
INTRAVENOUS | Status: AC
  Administered 2020-12-19: 2 ug via INTRAVENOUS
  Filled 2020-12-19: qty 2

## 2020-12-19 MED ORDER — HYDRALAZINE HCL 50 MG PO TABS
50.0000 mg | ORAL_TABLET | Freq: Three times a day (TID) | ORAL | 0 refills | Status: DC
  Filled 2020-12-19: qty 90, 30d supply, fill #0

## 2020-12-19 MED ORDER — DIPHENHYDRAMINE HCL 25 MG PO CAPS
ORAL_CAPSULE | ORAL | Status: AC
  Filled 2020-12-19: qty 1

## 2020-12-19 NOTE — Progress Notes (Signed)
[facility] Progress Note   Subjective:   Patient seen and examined at bedside.  Reports feeling well.  Ready to go home.  Will not be able to make it to his 1st shift dialysis chair time this AM so will have to have dialysis here prior to d/c.  Denies CP, SOB, n/v/d, abdominal pain, weakness and fatigue.   Objective Vitals:   12/19/20 0025 12/19/20 0051 12/19/20 0458 12/19/20 0743  BP: (!) 165/89  (!) 171/82 (!) 154/74  Pulse: 75  79 74  Resp: 18  18   Temp: 97.7 F (36.5 C)  98 F (36.7 C)   TempSrc: Oral  Oral   SpO2: 100%  99% 98%  Weight:  64.9 kg    Height:       Physical Exam General:well appearing male in NAD Heart:RRR, no mrg Lungs:+scattered wheeze, +faint crackles in b/l bases Abdomen:NTND Extremities:trace LE edema Dialysis Access: RU AVG +b/t   Filed Weights   12/17/20 1010 12/18/20 0327 12/19/20 0051  Weight: 64.1 kg 64.2 kg 64.9 kg    Intake/Output Summary (Last 24 hours) at 12/19/2020 0908 Last data filed at 12/19/2020 0851 Gross per 24 hour  Intake 1000 ml  Output --  Net 1000 ml    Additional Objective Labs: Basic Metabolic Panel: Recent Labs  Lab 12/16/20 0423 12/17/20 0646 12/18/20 0408 12/19/20 0405  NA 139 140 139 138  K 3.3* 3.4* 4.1 3.9  CL 102 104 105 103  CO2 '23 26 28 25  '$ GLUCOSE 89 97 122* 92  BUN 41* 35* 28* 44*  CREATININE 10.27* 8.50* 6.18* 8.21*  CALCIUM 8.0* 8.3* 8.7* 9.6  PHOS 8.8* 6.9* 4.4  --    Liver Function Tests: Recent Labs  Lab 12/14/20 2055 12/15/20 0548 12/16/20 0423 12/17/20 0646 12/18/20 0408  AST 11* 10*  --   --  14*  ALT 10 9  --   --  9  ALKPHOS 65 63  --   --  57  BILITOT 0.5 0.8  --   --  0.7  PROT 5.7* 5.5*  --   --  5.8*  ALBUMIN 2.8* 2.7* 2.6* 2.6* 2.7*   Recent Labs  Lab 12/14/20 2055  LIPASE 47   CBC: Recent Labs  Lab 12/12/20 1011 12/14/20 2055 12/15/20 0548 12/16/20 0423 12/17/20 0646 12/18/20 0408  WBC 6.5 6.5 6.7 4.8 5.7 6.6  NEUTROABS 4.7  --   --  2.9   --   --   HGB 9.3* 9.0* 8.4* 8.9* 9.4* 10.4*  HCT 30.2* 29.5* 26.7* 27.5* 29.6* 33.6*  MCV 96.8 97.7 95.4 92.9 95.2 97.1  PLT 156 135* 125* 119* 120* 135*   Medications:  iron sucrose 100 mg (12/17/20 1552)    (feeding supplement) PROSource Plus  30 mL Oral TID BM   amLODipine  10 mg Oral q AM   calcium acetate  2,001 mg Oral TID WC   Chlorhexidine Gluconate Cloth  6 each Topical Q0600   Chlorhexidine Gluconate Cloth  6 each Topical Q0600   COVID-19 mRNA Vac-TriS (Pfizer)  0.3 mL Intramuscular Once   darbepoetin (ARANESP) injection - DIALYSIS  200 mcg Intravenous Q Tue-HD   doxercalciferol  2 mcg Intravenous Q T,Th,Sa-HD   furosemide  40 mg Oral BID   hydrALAZINE  50 mg Oral Q8H   metoprolol tartrate  25 mg Oral BID   mupirocin ointment  1 application Nasal BID    Dialysis Orders: TTS - [place] ([place])  4hrs, BFR 400, DFR 800,  EDW 62kg, 2K/ 2Ca   Heparin: No Systemic Heparin Mircera 225 mcg q2wks - last 10/29/20 Hectorol 9mg (dose increased) IV qHD - 367m last given 11/23/20 Venofer: previously ordered load X 10 doses but only received 2 doses 6/11 and 6/14 Calcium Acetate 2 capsules TID with meals   Assessment/Plan: Acute on chronic diastolic heart failure: in setting of missing 3 weeks of HD. Volume status improved.  ESRD - on HD TTS: Hx non compliance with missed HD x 3weeks.  Serial HD ordered with HD 7/10, 7/11, 7/12.  K 3.4, use increased K bath. Discussed importance of compliance with HD.  Next HD today per regular schedule, will have to be completed in hospital b/c patient will not make it to his regular outpatient chair time.  Hypertension/volume  - BPs elevated this AM. Continue Metoprolol, Amlodipine, and Lasix. May need to increase meds if not improved once at dry weight.  Can be adjusted as outpatient. 11L of fluid removed in the last week. Per weights 3L over dry today, plan for UF to dry weight. Volume status greatly improved.  Anemia of CKD -  Hgb improved to 10.4. on max ESA as outpatient but missed dose. Iron load started as OP but only received 2 doses, continue iron load here, tsat 25%, ferritin 205. Given ESA with HD 7/12. Secondary Hyperparathyroidism -  Corr Ca and phos now in goal. Continue VDRA and binders.   Nutrition - Renal diet w/fluid restrictions. Albumin low 2.6-will add protein supplements Dispo - ok for d/c from renal standpoint after dialysis    LiJen MowPA-C [facility]/14/2022,9:08 AM  LOS: 4 days

## 2020-12-19 NOTE — Care Management Important Message (Signed)
Important Message  Patient Details  Name: Albert Harris MRN: [id number] Date of Birth: 1955-10-25   Medicare Important Message Given:  Yes     Shelda Altes 12/19/2020, 7:45 AM

## 2020-12-19 NOTE — Progress Notes (Signed)
[place] [id number] Manufacturing engineer [facility]) Hospital Liaison note:  Notified by Tomi Bamberger, [facility] of request for [facility] Palliative Care services. Will continue to follow for disposition.  Please call with any outpatient palliative questions or concerns.  Thank you for the opportunity to participate in this patient's care.  Thank you, Lorelee Market, LPN [facility] Liaison [phone]

## 2020-12-19 NOTE — Progress Notes (Signed)
Daily Progress Note   Patient Name: Albert Harris       Date: 12/19/2020 DOB: 1955-11-22  Age: 65 y.o. MRN#: [id number] Attending Physician: Tawni Millers Primary Care Physician: Kerin Perna, NP Admit Date: 12/14/2020  Reason for Consultation/Follow-up: Establishing goals of care  Subjective: Medical records reviewed. Assessed patient at the bedside. He is resting comfortably and looking forward to discharge home later day. He denies any further questions after our initial goals of care conversation.  Outpatient palliative care referral was explained and offered. [name] is initially uncertain whether to proceed. Educated on the importance of ongoing conversations and symptom management. He then agrees.  Questions and concerns addressed. PMT will continue to support holistically.  Length of Stay: 4  Current Medications: Scheduled Meds:   (feeding supplement) PROSource Plus  30 mL Oral TID BM   amLODipine  10 mg Oral q AM   calcium acetate  2,001 mg Oral TID WC   Chlorhexidine Gluconate Cloth  6 each Topical Q0600   Chlorhexidine Gluconate Cloth  6 each Topical Q0600   COVID-19 mRNA Vac-TriS (Pfizer)  0.3 mL Intramuscular Once   darbepoetin (ARANESP) injection - DIALYSIS  200 mcg Intravenous Q Tue-HD   doxercalciferol  2 mcg Intravenous Q T,Th,Sa-HD   furosemide  40 mg Oral BID   hydrALAZINE  50 mg Oral Q8H   metoprolol tartrate  25 mg Oral BID   mupirocin ointment  1 application Nasal BID    Continuous Infusions:  iron sucrose 100 mg (12/17/20 1552)    PRN Meds: acetaminophen **OR** acetaminophen, albuterol, diphenhydrAMINE, hydrALAZINE  Physical Exam Vitals and nursing note reviewed.  Constitutional:      General: He is not in acute distress.     Interventions: Nasal cannula in place.  Cardiovascular:     Rate and Rhythm: Normal rate and regular rhythm.  Pulmonary:     Effort: Pulmonary effort is normal.  Skin:    General: Skin is warm and dry.  Neurological:     Mental Status: He is alert and oriented to person, place, and time.  Psychiatric:        Mood and Affect: Mood normal.            Vital Signs: BP (!) 154/74 (BP Location: Left Arm)   Pulse 74  Temp 98 F (36.7 C) (Oral)   Resp 18   Ht '5\' 8"'$  (1.727 m)   Wt 64.9 kg   SpO2 98%   BMI 21.76 kg/m  SpO2: SpO2: 98 % O2 Device: O2 Device: Room Air O2 Flow Rate: O2 Flow Rate (L/min): 2 L/min  Intake/output summary:  Intake/Output Summary (Last 24 hours) at 12/19/2020 [id number] Last data filed at 12/19/2020 0851 Gross per 24 hour  Intake 1000 ml  Output --  Net 1000 ml   LBM: Last BM Date: 12/18/20 Baseline Weight: Weight: 74.9 kg Most recent weight: Weight: 64.9 kg       Palliative Assessment/Data: 60%     Patient Active Problem List   Diagnosis Date Noted   Goals of care, counseling/discussion    Abdominal swelling    Acute on chronic diastolic CHF (congestive heart failure) (HCC) 12/15/2020   SOB (shortness of breath) 12/15/2020   Hypertension    COPD (chronic obstructive pulmonary disease) (HCC)    Volume overload 12/14/2020   ESRD (end stage renal disease) (HCC)    Lower GI bleed    Pressure injury of skin 06/28/2020   Acute renal failure superimposed on stage 5 chronic kidney disease, not on chronic dialysis (HCC)    Acute respiratory disease due to COVID-19 virus 06/16/2020   CKD (chronic kidney disease) stage 5, GFR less than 15 ml/min (HCC) 03/19/2020   Claudication in peripheral vascular disease ([place]) 09/12/2019   Carotid stenosis 09/12/2019   PFO (patent foramen ovale) 08/10/2019   Noncompliance 08/10/2019   Hx of transient ischemic attack ([name]) 08/10/2019   Chronic diastolic heart failure (HCC)    Elevated troponin    Hypertensive  emergency    Acute respiratory failure with hypoxia ([place]) 07/18/2019   Acute pulmonary edema ([place]) 07/18/2019   ARF (acute renal failure) ([place]) 07/18/2019   Anemia 07/18/2019   Right sided weakness 11/09/2017   CKD (chronic kidney disease), stage III ([place]) 11/09/2017   Tobacco abuse 01/19/2017   Hypertensive urgency 12/24/2016   [name] (transient ischemic attack) 12/24/2016    Palliative Care Assessment & Plan   Patient Profile: 65 y.o. male  with past medical history of end-stage renal disease on hemodialysis, anemia of chronic kidney disease with baseline hemoglobin 8-10, COPD, and chronic diastolic heart failure admitted on 12/14/2020 with abdominal distension after missing 3 weeks of HD sessions.   Palliative medicine has been consulted to assist with goals of care conversation.   Assessment: Acute on chronic diastolic heart failure, improved End stage renal disease on hemodialysis, volume status improved Goals of care conversation  Recommendations/Plan: Full code/full scope Appreciate TOC assistance with outpatient palliative care referral, patient will discharge to home today Psychosocial and emotional support provided  Goals of Care and Additional Recommendations: Limitations on Scope of Treatment: Full Scope Treatment  Prognosis:  Unable to determine  Discharge Planning: Home with Palliative Services   Total time: 15 minutes Greater than 50% of this time was spent in counseling and coordinating care related to the above assessment and plan.  Dorthy Cooler, PA-C Palliative Medicine Team Team phone # [phone]  Thank you for allowing the Palliative Medicine Team to assist in the care of this patient. Please utilize secure chat with additional questions, if there is no response within 30 minutes please call the above phone number.  Palliative Medicine Team providers are available by phone from 7am to 7pm daily and can be reached through the team cell phone.   Should this patient require assistance  outside of these hours, please call the patient's attending physician.

## 2020-12-19 NOTE — TOC Transition Note (Signed)
Transition of Care [facility]) - CM/SW Discharge Note   Patient Details  Name: Albert Harris MRN: [id number] Date of Birth: 1955-12-12  Transition of Care [facility]) CM/SW Contact:  Zenon Mayo, RN Phone Number: 12/19/2020, 12:03 PM   Clinical Narrative:    NCM spoke with patient at bedside, offered choice for outpatient palliative services , he chose [facility], NCM made referral to [facility] .  He states he has a walker and a w/chair at home but has not had to use them, he has no issues with medications or taking his medications.  Wife will transport him home at dc.     Final next level of care: Home/Self Care Barriers to Discharge: No Barriers Identified   Patient Goals and CMS Choice Patient states their goals for this hospitalization and ongoing recovery are:: return home   Choice offered to / list presented to : NA  Discharge Placement                       Discharge Plan and Services In-house Referral: Hospice / Palliative Care Discharge Planning Services: CM Consult Post Acute Care Choice: NA            DME Agency: NA       HH Arranged: NA          Social Determinants of Health (SDOH) Interventions     Readmission Risk Interventions Readmission Risk Prevention Plan 12/19/2020  Transportation Screening Complete  PCP or Specialist Appt within 3-5 Days Complete  HRI or [place] Complete  Social Work Consult for [place] Planning/Counseling Complete  Palliative Care Screening Complete  Medication Review [employer] photographer) Complete  Some recent data might be hidden

## 2020-12-19 NOTE — Consult Note (Addendum)
   [facility] Inpatient Consult   12/19/2020  Albert Harris 05/01/1956 [id number]  [employer] [ACO] Patient: Medicare CMS DCE  Addendum [id number]: Called patient several times unable to reach by hospital phone, spoke with RN, and she was able to get patient on the phone, HIPAA verified. Explained [place] Management involvement for post hospital follow up.  Patient agrees and also gave permission for follow up with Wayland Denis [spouse] if unable to reach him, [phone].   Primary Care Provider:  Kerin Perna, NP, [facility]  Patient screened for high risk score for unplanned readmission risk and  to assess for potential [place] Management service needs for post hospital transition.  Review of patient's medical record reveals patient was missing 3 weeks of dialysis he states due to GI issues with diarrhea and too embarrassed to go to HD per palliative care notes.  Patient has been outreach by the palliative team and is to have outpatient palliative home services. Patient is currently off the unit in HD noted prior to transitioning home.  Plan:  To assign patient for post hospital care management follow up for high risk for unplanned readmission.    For questions contact:   Natividad Brood, RN BSN [facility] Liaison  [phone] business mobile phone Toll free office [phone]  Fax number: [phone] [email or web address]

## 2020-12-19 NOTE — TOC Initial Note (Signed)
Transition of Care [facility]) - Initial/Assessment Note    Patient Details  Name: Albert Harris MRN: [id number] Date of Birth: 08/06/1955  [facility]) CM/SW Contact:    Zenon Mayo, RN Phone Number: 12/19/2020, 12:00 PM  Clinical Narrative:                 NCM spoke with patient at bedside, offered choice for outpatient palliative services , he chose [facility], NCM made referral to [facility] .  He states he has a walker and a w/chair at home but has not had to use them, he has no issues with medications or taking his medications.  Wife will transport him home at dc.   Expected Discharge Plan: Home/Self Care Barriers to Discharge: No Barriers Identified   Patient Goals and CMS Choice Patient states their goals for this hospitalization and ongoing recovery are:: return home   Choice offered to / list presented to : NA  Expected Discharge Plan and Services Expected Discharge Plan: Home/Self Care In-house Referral: Hospice / Palliative Care Discharge Planning Services: CM Consult Post Acute Care Choice: NA Living arrangements for the past 2 months: Single Family Home Expected Discharge Date: 12/19/20                 DME Agency: NA       HH Arranged: NA          Prior Living Arrangements/Services Living arrangements for the past 2 months: Single Family Home Lives with:: Spouse Patient language and need for interpreter reviewed:: Yes Do you feel safe going back to the place where you live?: Yes      Need for Family Participation in Patient Care: Yes (Comment) Care giver support system in place?: Yes (comment) Current home services: DME (walker, w/chair) Criminal Activity/Legal Involvement Pertinent to Current Situation/Hospitalization: No - Comment as needed  Activities of Daily Living Home Assistive Devices/Equipment: None ADL Screening (condition at time of admission) Patient's cognitive ability adequate to safely complete daily activities?:  Yes Is the patient deaf or have difficulty hearing?: No Does the patient have difficulty seeing, even when wearing glasses/contacts?: No Does the patient have difficulty concentrating, remembering, or making decisions?: No Patient able to express need for assistance with ADLs?: Yes Does the patient have difficulty dressing or bathing?: No Independently performs ADLs?: Yes (appropriate for developmental age) Does the patient have difficulty walking or climbing stairs?: No Weakness of Legs: Both Weakness of Arms/Hands: None  Permission Sought/Granted                  Emotional Assessment Appearance:: Appears stated age Attitude/Demeanor/Rapport: Engaged Affect (typically observed): Appropriate Orientation: : Oriented to Self, Oriented to Place, Oriented to  Time, Oriented to Situation Alcohol / Substance Use: Not Applicable Psych Involvement: No (comment)  Admission diagnosis:  Shortness of breath [R06.02] Volume overload [E87.70] Abdominal swelling [R19.00] Patient Active Problem List   Diagnosis Date Noted   Goals of care, counseling/discussion    Abdominal swelling    Acute on chronic diastolic CHF (congestive heart failure) ([place]) 12/15/2020   SOB (shortness of breath) 12/15/2020   Hypertension    COPD (chronic obstructive pulmonary disease) (HCC)    Volume overload 12/14/2020   ESRD (end stage renal disease) ([place])    Lower GI bleed    Pressure injury of skin 06/28/2020   Acute renal failure superimposed on stage 5 chronic kidney disease, not on chronic dialysis ([place])    Acute respiratory disease due to COVID-19 virus  06/16/2020   CKD (chronic kidney disease) stage 5, GFR less than 15 ml/min (HCC) 03/19/2020   Claudication in peripheral vascular disease ([place]) 09/12/2019   Carotid stenosis 09/12/2019   PFO (patent foramen ovale) 08/10/2019   Noncompliance 08/10/2019   Hx of transient ischemic attack ([name]) 08/10/2019   Chronic diastolic heart failure (HCC)     Elevated troponin    Hypertensive emergency    Acute respiratory failure with hypoxia ([place]) 07/18/2019   Acute pulmonary edema ([place]) 07/18/2019   ARF (acute renal failure) ([place]) 07/18/2019   Anemia 07/18/2019   Right sided weakness 11/09/2017   CKD (chronic kidney disease), stage III ([place]) 11/09/2017   Tobacco abuse 01/19/2017   Hypertensive urgency 12/24/2016   [name] (transient ischemic attack) 12/24/2016   PCP:  Kerin Perna, NP Pharmacy:   [facility] [address] [place] [address] Phone: [phone] Fax: [address], [place] [address] Phone: [phone] Fax: [phone]     Social Determinants of Health (SDOH) Interventions    Readmission Risk Interventions Readmission Risk Prevention Plan 12/19/2020  Transportation Screening Complete  PCP or Specialist Appt within 3-5 Days Complete  HRI or Home Care Consult Complete  Social Work Consult for [place] Planning/Counseling Complete  Palliative Care Screening Complete  Medication Review Press [employer]) Complete  Some recent data might be hidden

## 2020-12-19 NOTE — Discharge Summary (Signed)
Physician Discharge Summary  Albert Harris [id number] DOB: 11-21-1955 DOA: 12/14/2020  PCP: Albert Perna, NP  Admit date: 12/14/2020 Discharge date: 12/19/2020  Admitted From: Home  Disposition:  Home   Recommendations for Outpatient Follow-up and new medication changes:  Follow up with Albert Mire P, NP in 7 to 10 days. Added hydralazine for blood pressure control.   Home Health: no   Equipment/Devices: no    Discharge Condition: stable  CODE STATUS: full  Diet recommendation:  heart healthy and renal prudent.   Brief/Interim Summary: [name] was admitted to the hospital with a working diagnosis of acute on chronic diastolic heart failure exacerbation in the setting of ESRD.   65 year old male past medical history for end-stage renal disease on hemodialysis, COPD and chronic diastolic heart failure who presented abdominal distention.  Patient has been noncompliant with outpatient hemodialysis treatments (missed 3 weeks).  Reported 7 to 10 days of progressive abdominal distention associated with scrotal edema and lower extremity edema.  On his initial physical examination temperature 97.5, heart rate 86, blood pressure 197/99, respiratory rate 18, oxygen saturation 99%.  His lungs had bibasilar rales, heart S1-S2, present, rhythmic, soft abdomen, mildly distended, positive lower extremity edema.   Sodium 142, potassium 4.3, chloride 109, bicarb 13, glucose 125, BUN 108, creatinine 20.73, BNP 4500, white count 6.5, hemoglobin 9.0, hematocrit 39.5, platelets 135. SARS COVID-19 negative.  CT of the abdomen pelvis with interval worsening anasarca.  Sigmoid diverticulosis.  No hydronephrosis or nephrolithiasis.  Chest radiograph with cardiomegaly, hilar vascular congestion, bilateral bibasilar atelectasis, more right and left.  EKG 88 bpm, normal axis, normal intervals, sinus rhythm, poor R wave progression, no ST segment changes, negative T wave lead I/aVL.,  Positive  LVH.  Patient underwent serial hemodialysis, 7/10, 7/11, 7/13.  11 L of fluid was removed with good toleration. Blood pressure regimen was adjusted.  Acute on chronic diastolic heart failure exacerbation, related to volume overload due to end-stage renal disease.  Hypertensive emergency.  Hypokalemia. Patient underwent aggressive hemodialysis with ultrafiltration.  11 L were removed with good toleration. His antihypertensive agents were modified, continue amlodipine, and metoprolol.  Added hydralazine. He was advised about compliance with his renal replacement therapy as an outpatient.  Anemia chronic kidney disease.  Patient received IV iron in 93.18. Secondary hyperparathyroidism, metabolic bone disease, patient will continue phosphate binders and calcium supplements. Electrolytes were corrected.  2.  COPD.  No signs of acute exacerbation.  3.  Hypertension.  Hydralazine was added with improvement of blood pressure.  Patient has been advised to have close follow-up as an outpatient. Continue furosemide bid.     Discharge Diagnoses:  Principal Problem:   Acute on chronic diastolic CHF (congestive heart failure) (HCC) Active Problems:   CKD (chronic kidney disease) stage 5, GFR less than 15 ml/min (HCC)   Volume overload   SOB (shortness of breath)   Hypertension   COPD (chronic obstructive pulmonary disease) (HCC)   Goals of care, counseling/discussion   Abdominal swelling    Discharge Instructions   Allergies as of 12/19/2020   No Known Allergies      Medication List     STOP taking these medications    amoxicillin-clavulanate 875-125 MG tablet Commonly known as: AUGMENTIN   atorvastatin 40 MG tablet Commonly known as: LIPITOR   doxazosin 4 MG tablet Commonly known as: CARDURA   gabapentin 300 MG capsule Commonly known as: NEURONTIN       TAKE these medications  albuterol 108 (90 Base) MCG/ACT inhaler Commonly known as: VENTOLIN HFA INHALE 1-2  PUFFS INTO THE LUNGS EVERY 6 (SIX) HOURS AS NEEDED FOR WHEEZING OR SHORTNESS OF BREATH.   amLODipine 10 MG tablet Commonly known as: NORVASC Take 1 tablet (10 mg total) by mouth in the morning.   ASPIRIN LOW DOSE 81 MG EC tablet Generic drug: aspirin TAKE 1 TABLET (81 MG TOTAL) BY MOUTH DAILY. SWALLOW WHOLE.   calcium acetate 667 MG capsule Commonly known as: PHOSLO TAKE 2 CAPSULES BY MOUTH THREE TIMES DAILY WITH MEALS   furosemide 40 MG tablet Commonly known as: LASIX TAKE 1 TABLET BY MOUTH TWICE A DAY, AT 8AM AND 2PM   hydrALAZINE 50 MG tablet Commonly known as: APRESOLINE Take 1 tablet (50 mg total) by mouth every 8 (eight) hours.   hydrOXYzine 50 MG tablet Commonly known as: ATARAX/VISTARIL Take 1 tablet by mouth four times a day as needed prn for itching on dialysis days   lidocaine-prilocaine cream Commonly known as: EMLA Apply 1 application topically See admin instructions. Apply a small amount to access site 1-2 hours before dialysis,cover with occlusive dressing(saran wrap)   metoprolol tartrate 25 MG tablet Commonly known as: LOPRESSOR TAKE 1 TABLET BY MOUTH TWICE A DAY   nitroGLYCERIN 0.4 MG SL tablet Commonly known as: NITROSTAT Place 1 tablet (0.4 mg total) under the tongue every 5 (five) minutes x 3 doses as needed for chest pain.   ondansetron 4 MG disintegrating tablet Commonly known as: Zofran ODT Take 1 tablet (4 mg total) by mouth every 8 (eight) hours as needed for nausea or vomiting.   oxyCODONE-acetaminophen 7.5-325 MG tablet Commonly known as: Percocet Take 1 tablet by mouth every 4 (four) hours as needed for severe pain.   sodium bicarbonate 650 MG tablet Take 650 mg by mouth daily as needed for heartburn.        No Known Allergies  Consultations: Nephrology    Procedures/Studies: CT ABDOMEN PELVIS WO CONTRAST  Result Date: 12/14/2020 CLINICAL DATA:  65 year old male with abdominal pain. EXAM: CT ABDOMEN AND PELVIS WITHOUT CONTRAST  TECHNIQUE: Multidetector CT imaging of the abdomen and pelvis was performed following the standard protocol without IV contrast. COMPARISON:  CT abdomen pelvis dated 12/05/2020. FINDINGS: Evaluation of this exam is limited in the absence of intravenous contrast. Lower chest: Partially visualized bilateral pleural effusions, right greater than left. There is diffuse interstitial and interlobular septal prominence most consistent with edema. There is mild cardiomegaly with coronary vascular calcification. No intra-abdominal free air. There is diffuse mesenteric edema and small ascites. Hepatobiliary: The liver is unremarkable. No intrahepatic biliary dilatation. High attenuating content within the gallbladder may represent sludge or small stones. Evaluation of the gallbladder is limited on this CT due to diffuse mesenteric edema and anasarca. Ultrasound may provide better evaluation if there is clinical concern for acute cholecystitis. Pancreas: The pancreas is unremarkable as visualized. Spleen: Normal in size without focal abnormality. Adrenals/Urinary Tract: The adrenal glands are unremarkable. Moderate bilateral renal parenchyma atrophy. There is no hydronephrosis or nephrolithiasis on either side. Renal vascular calcifications noted. The urinary bladder is minimally distended. High attenuating content within the bladder may represent excreted contrast from prior study or proteinaceous urine. Correlation with urinalysis recommended. Stomach/Bowel: There is sigmoid diverticulosis with muscular hypertrophy. No active inflammatory changes. There is no bowel obstruction. The appendix is normal. Vascular/Lymphatic: Advanced aortoiliac atherosclerotic disease. Bilateral common iliac artery stents noted. The IVC is grossly unremarkable. No portal venous gas. There is no  adenopathy. Reproductive: The prostate and seminal vesicles are grossly unremarkable. Other: Diffuse subcutaneous edema and anasarca and severe scrotal  edema, worsened since the prior CT. No fluid collection. Musculoskeletal: Degenerative changes of the spine. No acute osseous pathology. IMPRESSION: 1. Interval worsening of the anasarca. 2. Sigmoid diverticulosis. No bowel obstruction. Normal appendix. 3. Probable sludge within the gallbladder. 4. No hydronephrosis or nephrolithiasis. High attenuating content within the bladder may represent excreted contrast from prior study or proteinaceous urine. Correlation with urinalysis recommended. 5. Aortic Atherosclerosis (ICD10-I70.0). Electronically Signed   By: Anner Crete M.D.   On: 12/14/2020 23:39   DG Chest Portable 1 View  Result Date: 12/14/2020 CLINICAL DATA:  66 year old male with shortness of breath. EXAM: PORTABLE CHEST 1 VIEW COMPARISON:  Chest radiograph dated 06/24/2020. FINDINGS: There is cardiomegaly with mild vascular congestion and probable mild edema. No focal consolidation or pneumothorax. Small left pleural effusion. No acute osseous pathology. IMPRESSION: Cardiomegaly with probable mild CHF and small left pleural effusion. Electronically Signed   By: Anner Crete M.D.   On: 12/14/2020 22:10   US SCROTUM W/DOPPLER  Result Date: 12/12/2020 CLINICAL DATA:  Bilateral testicular swelling for the past week. EXAM: SCROTAL ULTRASOUND DOPPLER ULTRASOUND OF THE TESTICLES TECHNIQUE: Complete ultrasound examination of the testicles, epididymis, and other scrotal structures was performed. Color and spectral Doppler ultrasound were also utilized to evaluate blood flow to the testicles. COMPARISON:  None. FINDINGS: Right testicle Measurements: 4.3 x 2.5 x 2.5 cm. No mass or microlithiasis visualized. Left testicle Measurements: 3.3 x 2.7 x 2.5 cm. No mass or microlithiasis visualized. Right epididymis: Normal in size and appearance. 3 mm cyst in the epididymal head. Left epididymis:  Normal in size and appearance. Hydrocele:  Small left hydrocele. Varicocele:  None visualized. Pulsed Doppler  interrogation of both testes demonstrates normal low resistance arterial and venous waveforms bilaterally. Diffuse scrotal edema. IMPRESSION: 1. Diffuse scrotal edema. 2. Small left hydrocele. 3. Normal sonographic appearance of the bilateral testicles. Electronically Signed   By: Titus Dubin M.D.   On: 12/12/2020 11:19   CT Angio Abd/Pel W and/or Wo Contrast  Result Date: 12/05/2020 CLINICAL DATA:  65 year old male with acute intermittent abdominal pain. Concern for mesenteric ischemia. EXAM: CTA ABDOMEN AND PELVIS WITHOUT AND WITH CONTRAST TECHNIQUE: Multidetector CT imaging of the abdomen and pelvis was performed using the standard protocol during bolus administration of intravenous contrast. Multiplanar reconstructed images and MIPs were obtained and reviewed to evaluate the vascular anatomy. CONTRAST:  21m OMNIPAQUE IOHEXOL 350 MG/ML SOLN COMPARISON:  CT dated 06/18/2020. FINDINGS: VASCULAR Aorta: Advanced calcified and noncalcified plaque. No aneurysmal dilatation or dissection. No periaortic fluid collection or hematoma. Celiac: There is atherosclerotic calcification of the origin of the celiac axis. The celiac artery and its major branches remain patent. SMA: There is intraluminal calcified and noncalcified plaque spanning approximately 3 cm length of the proximal SMA. There is associated high-grade luminal narrowing. Similar findings were seen on the prior CT. The SMA however remains patent. Renals: There is atherosclerotic calcification of the origin the renal arteries. There is advanced diffuse plaque along the renal arteries bilaterally. There is diminished flow within the renal arteries. IMA: Atherosclerotic calcification of the origin of the IMA. The IMA remains patent. Inflow: There is advanced atherosclerotic calcification the iliac arteries. Vascular stents noted in the common iliac arteries bilaterally. The stents appear patent. There is overall luminal narrowing of the external iliac  arteries due to heavy plaque. The external iliac arteries remain patent. The internal  iliac arteries are calcified and occluded. Proximal Outflow: There is occlusion of the visualized proximal right superficial femoral artery. Veins: The IVC is unremarkable. No portal venous gas. Review of the MIP images confirms the above findings. NON-VASCULAR Lower chest: Partially visualized small bilateral pleural effusions with associated partial compressive atelectasis of the lower lobes. Pneumonia is not excluded clinical correlation is recommended. Overall interval increase in the pleural effusion compared to the prior CT. There is mild cardiomegaly. No intra-abdominal free air. Small perihepatic free fluid as well as small amount of fluid in the posterior pelvis and diffuse mesenteric edema. Hepatobiliary: The liver is unremarkable. No intrahepatic biliary dilatation. No calcified gallstone. There is pericholecystic edema and stranding, likely related to generalized mesenteric edema. Ultrasound may provide better evaluation of the gallbladder if there is high clinical concern for acute cholecystitis. Pancreas: Unremarkable. No pancreatic ductal dilatation or surrounding inflammatory changes. Spleen: Normal in size without focal abnormality. Adrenals/Urinary Tract: The adrenal glands are unremarkable. Moderate bilateral renal parenchyma atrophy. There is no hydronephrosis on either side. There is renal vascular calcification. The visualized ureters and urinary bladder appear unremarkable. Stomach/Bowel: There is sigmoid diverticulosis without active inflammatory changes. Mild thickened appearance of several loops of small bowel as well as thickened appearance of the rectosigmoid which may be related to diffuse mesenteric edema and anasarca. Enteritis or colitis is not excluded clinical correlation recommended. No pneumatosis. The appendix is normal. Lymphatic: No adenopathy. Reproductive: The prostate and seminal vesicles  are grossly unremarkable. No pelvic mass. Other: Diffuse mesenteric and subcutaneous edema and anasarca. Musculoskeletal: No acute or significant osseous findings. IMPRESSION: 1. No CT evidence mesenteric ischemia. 2. Thickened appearance of loops of small bowel and rectosigmoid may be related to generalized edema and anasarca. Enteritis or enterocolitis is not excluded. Clinical correlation is recommended. 3. Extensive vascular calcification and atherosclerotic plaque as above and similar to prior CT. 4. Small bilateral pleural effusions, increased since the prior CT. There is associated partial compressive atelectasis of the lower lobes versus pneumonia. 5. Sigmoid diverticulosis. 6. Aortic Atherosclerosis (ICD10-I70.0). Electronically Signed   By: Anner Crete M.D.   On: 12/05/2020 21:46      Subjective: Patient is feeling better, dyspnea and edema have improved.   Discharge Exam: Vitals:   12/19/20 0458 12/19/20 0743  BP: (!) 171/82 (!) 154/74  Pulse: 79 74  Resp: 18   Temp: 98 F (36.7 C)   SpO2: 99% 98%   Vitals:   12/19/20 0025 12/19/20 0051 12/19/20 0458 12/19/20 0743  BP: (!) 165/89  (!) 171/82 (!) 154/74  Pulse: 75  79 74  Resp: 18  18   Temp: 97.7 F (36.5 C)  98 F (36.7 C)   TempSrc: Oral  Oral   SpO2: 100%  99% 98%  Weight:  64.9 kg    Height:        General: Not in pain or dyspnea.  Neurology: Awake and alert, non focal  E ENT: no pallor, no icterus, oral mucosa moist Cardiovascular: No JVD. S1-S2 present, rhythmic, no gallops, rubs, or murmurs. No lower extremity edema. Pulmonary: positive breath sounds bilaterally, adequate air movement, no wheezing, rhonchi or rales. Gastrointestinal. Abdomen soft and non tender. Skin. No rashes Musculoskeletal: no joint deformities   The results of significant diagnostics from this hospitalization (including imaging, microbiology, ancillary and laboratory) are listed below for reference.     Microbiology: Recent  Results (from the past 240 hour(s))  Resp Panel by RT-PCR (Flu A&B, Covid) Nasopharyngeal Swab  Status: None   Collection Time: 12/14/20 10:49 PM   Specimen: Nasopharyngeal Swab; Nasopharyngeal(NP) swabs in vial transport medium  Result Value Ref Range Status   SARS Coronavirus 2 by RT PCR NEGATIVE NEGATIVE Final    Comment: (NOTE) SARS-CoV-2 target nucleic acids are NOT DETECTED.  The SARS-CoV-2 RNA is generally detectable in upper respiratory specimens during the acute phase of infection. The lowest concentration of SARS-CoV-2 viral copies this assay can detect is 138 copies/mL. A negative result does not preclude SARS-Cov-2 infection and should not be used as the sole basis for treatment or other patient management decisions. A negative result may occur with  improper specimen collection/handling, submission of specimen other than nasopharyngeal swab, presence of viral mutation(s) within the areas targeted by this assay, and inadequate number of viral copies(<138 copies/mL). A negative result must be combined with clinical observations, patient history, and epidemiological information. The expected result is Negative.  Fact Sheet for Patients:  EntrepreneurPulse.com.au  Fact Sheet for Healthcare Providers:  IncredibleEmployment.be  This test is no t yet approved or cleared by the [place] FDA and  has been authorized for detection and/or diagnosis of SARS-CoV-2 by FDA under an Emergency Use Authorization (EUA). This EUA will remain  in effect (meaning this test can be used) for the duration of the COVID-19 declaration under Section 564(b)(1) of the Act, 21 U.S.C.section 360bbb-3(b)(1), unless the authorization is terminated  or revoked sooner.       Influenza A by PCR NEGATIVE NEGATIVE Final   Influenza B by PCR NEGATIVE NEGATIVE Final    Comment: (NOTE) The Xpert Xpress SARS-CoV-2/FLU/RSV plus assay is intended as an aid in the  diagnosis of influenza from Nasopharyngeal swab specimens and should not be used as a sole basis for treatment. Nasal washings and aspirates are unacceptable for Xpert Xpress SARS-CoV-2/FLU/RSV testing.  Fact Sheet for Patients: EntrepreneurPulse.com.au  Fact Sheet for Healthcare Providers: IncredibleEmployment.be  This test is not yet approved or cleared by the [place] FDA and has been authorized for detection and/or diagnosis of SARS-CoV-2 by FDA under an Emergency Use Authorization (EUA). This EUA will remain in effect (meaning this test can be used) for the duration of the COVID-19 declaration under Section 564(b)(1) of the Act, 21 U.S.C. section 360bbb-3(b)(1), unless the authorization is terminated or revoked.  Performed at [facility], [place] [address], [place], [place] [address]   MRSA Next Gen by PCR, Nasal     Status: Abnormal   Collection Time: 12/15/20  5:22 AM   Specimen: Nasal Mucosa; Nasal Swab  Result Value Ref Range Status   MRSA by PCR Next Gen DETECTED (A) NOT DETECTED Final    Comment: RESULT CALLED TO, READ BACK BY AND VERIFIED WITH: WALL, K RN 12/15/2020 AT 0704 SKEEN,Harris (NOTE) The GeneXpert MRSA Assay (FDA approved for NASAL specimens only), is one component of a comprehensive MRSA colonization surveillance program. It is not intended to diagnose MRSA infection nor to guide or monitor treatment for MRSA infections. Test performance is not FDA approved in patients less than 44 years old. Performed at [facility], [place] [address] [place], [place] [address]      Labs: BNP (last 3 results) Recent Labs    12/14/20 2122  BNP [id number]*   Basic Metabolic Panel: Recent Labs  Lab 12/15/20 0548 12/16/20 0423 12/17/20 0646 12/18/20 0408 12/19/20 0405  NA 142 139 140 139 138  K 4.4 3.3* 3.4* 4.1 3.9  CL 111 102 104 105  103  CO2 12* '23 26 28 25  '$ GLUCOSE 92 89 97 122* 92  BUN 114* 41* 35* 28* 44*   CREATININE 20.18* 10.27* 8.50* 6.18* 8.21*  CALCIUM 7.8* 8.0* 8.3* 8.7* 9.6  MG 3.6*  --   --  2.4  --   PHOS >30.0* 8.8* 6.9* 4.4  --    Liver Function Tests: Recent Labs  Lab 12/12/20 1011 12/14/20 2055 12/15/20 0548 12/16/20 0423 12/17/20 0646 12/18/20 0408  AST 11* 11* 10*  --   --  14*  ALT '10 10 9  '$ --   --  9  ALKPHOS 62 65 63  --   --  57  BILITOT 1.0 0.5 0.8  --   --  0.7  PROT 5.5* 5.7* 5.5*  --   --  5.8*  ALBUMIN 2.8* 2.8* 2.7* 2.6* 2.6* 2.7*   Recent Labs  Lab 12/14/20 2055  LIPASE 47   No results for input(s): AMMONIA in the last 168 hours. CBC: Recent Labs  Lab 12/12/20 1011 12/14/20 2055 12/15/20 0548 12/16/20 0423 12/17/20 0646 12/18/20 0408  WBC 6.5 6.5 6.7 4.8 5.7 6.6  NEUTROABS 4.7  --   --  2.9  --   --   HGB 9.3* 9.0* 8.4* 8.9* 9.4* 10.4*  HCT 30.2* 29.5* 26.7* 27.5* 29.6* 33.6*  MCV 96.8 97.7 95.4 92.9 95.2 97.1  PLT 156 135* 125* 119* 120* 135*   Cardiac Enzymes: No results for input(s): CKTOTAL, CKMB, CKMBINDEX, TROPONINI in the last 168 hours. BNP: Invalid input(s): POCBNP CBG: No results for input(s): GLUCAP in the last 168 hours. D-Dimer No results for input(s): DDIMER in the last 72 hours. Hgb A1c No results for input(s): HGBA1C in the last 72 hours. Lipid Profile No results for input(s): CHOL, HDL, LDLCALC, TRIG, CHOLHDL, LDLDIRECT in the last 72 hours. Thyroid function studies No results for input(s): TSH, T4TOTAL, T3FREE, THYROIDAB in the last 72 hours.  Invalid input(s): FREET3 Anemia work up No results for input(s): VITAMINB12, FOLATE, FERRITIN, TIBC, IRON, RETICCTPCT in the last 72 hours. Urinalysis    Component Value Date/Time   COLORURINE YELLOW 12/05/2020 1339   APPEARANCEUR CLEAR 12/05/2020 1339   LABSPEC 1.016 12/05/2020 1339   PHURINE 7.0 12/05/2020 1339   GLUCOSEU 150 (A) 12/05/2020 1339   HGBUR NEGATIVE 12/05/2020 1339   BILIRUBINUR NEGATIVE 12/05/2020 1339   KETONESUR 5 (A) 12/05/2020 1339   PROTEINUR  >=300 (A) 12/05/2020 1339   UROBILINOGEN 1.0 03/19/2014 1051   NITRITE NEGATIVE 12/05/2020 1339   LEUKOCYTESUR NEGATIVE 12/05/2020 1339   Sepsis Labs Invalid input(s): PROCALCITONIN,  WBC,  LACTICIDVEN Microbiology Recent Results (from the past 240 hour(s))  Resp Panel by RT-PCR (Flu A&B, Covid) Nasopharyngeal Swab     Status: None   Collection Time: 12/14/20 10:49 PM   Specimen: Nasopharyngeal Swab; Nasopharyngeal(NP) swabs in vial transport medium  Result Value Ref Range Status   SARS Coronavirus 2 by RT PCR NEGATIVE NEGATIVE Final    Comment: (NOTE) SARS-CoV-2 target nucleic acids are NOT DETECTED.  The SARS-CoV-2 RNA is generally detectable in upper respiratory specimens during the acute phase of infection. The lowest concentration of SARS-CoV-2 viral copies this assay can detect is 138 copies/mL. A negative result does not preclude SARS-Cov-2 infection and should not be used as the sole basis for treatment or other patient management decisions. A negative result may occur with  improper specimen collection/handling, submission of specimen other than nasopharyngeal swab, presence of viral mutation(s) within the areas targeted by  this assay, and inadequate number of viral copies(<138 copies/mL). A negative result must be combined with clinical observations, patient history, and epidemiological information. The expected result is Negative.  Fact Sheet for Patients:  EntrepreneurPulse.com.au  Fact Sheet for Healthcare Providers:  IncredibleEmployment.be  This test is no t yet approved or cleared by the [place] FDA and  has been authorized for detection and/or diagnosis of SARS-CoV-2 by FDA under an Emergency Use Authorization (EUA). This EUA will remain  in effect (meaning this test can be used) for the duration of the COVID-19 declaration under Section 564(b)(1) of the Act, 21 U.S.C.section 360bbb-3(b)(1), unless the authorization is  terminated  or revoked sooner.       Influenza A by PCR NEGATIVE NEGATIVE Final   Influenza B by PCR NEGATIVE NEGATIVE Final    Comment: (NOTE) The Xpert Xpress SARS-CoV-2/FLU/RSV plus assay is intended as an aid in the diagnosis of influenza from Nasopharyngeal swab specimens and should not be used as a sole basis for treatment. Nasal washings and aspirates are unacceptable for Xpert Xpress SARS-CoV-2/FLU/RSV testing.  Fact Sheet for Patients: EntrepreneurPulse.com.au  Fact Sheet for Healthcare Providers: IncredibleEmployment.be  This test is not yet approved or cleared by the [place] FDA and has been authorized for detection and/or diagnosis of SARS-CoV-2 by FDA under an Emergency Use Authorization (EUA). This EUA will remain in effect (meaning this test can be used) for the duration of the COVID-19 declaration under Section 564(b)(1) of the Act, 21 U.S.C. section 360bbb-3(b)(1), unless the authorization is terminated or revoked.  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 It is not intended to diagnose MRSA infection nor to guide or monitor treatment for MRSA infections. Test performance is not FDA approved in patients less than 16 years old. Performed at [facility], [place] [address] [place], [place] [address]      Time coordinating discharge: 45 minutes  SIGNED:   Tawni Millers, MD  [facility] 12/19/2020, 8:35  AM

## 2020-12-20 ENCOUNTER — Telehealth: Payer: Self-pay

## 2020-12-20 ENCOUNTER — Telehealth: Payer: Self-pay | Admitting: Nephrology

## 2020-12-20 ENCOUNTER — Other Ambulatory Visit: Payer: Self-pay | Admitting: *Deleted

## 2020-12-20 NOTE — Telephone Encounter (Signed)
Transition of Care Contact from [facility]  Date of Discharge: 12/19/20 Date of Contact: 12/20/20 Method of contact: phone - attempted  Attempted to contact patient to discuss transition of care from inpatient admission.  Patient did not answer the phone.  Message was left on patient's voicemail informing them we would attempt to call them again and if unable to reach will follow up at dialysis.  Jen Mow, PA-C [facility] Pager: [phone]

## 2020-12-20 NOTE — Patient Outreach (Signed)
[facility]) Care Management  12/20/2020  Albert Harris [birth year] [id number]   Referral Date: 7/15 Referral Source: Hospital liaison Referral Reason: Post hospital discharge Insurance: Traditional Medicare   Outreach attempt #1, unsuccessful to member and wife.  HIPAA compliant voice messages left on both numbers.    Plan: RN CM will send outreach letter and follow up within the next 34 business days.  Valente David, [place], MSN [place] [phone]

## 2020-12-20 NOTE — Telephone Encounter (Signed)
Transition Care Management Unsuccessful Follow-up Telephone Call  Date of discharge and from where:  [facility] on 12/19/2020  Attempts:  1st Attempt  Reason for unsuccessful TCM follow-up call:  Left voice message unable to reach pt at this time.   Pt has scheduled [facility] appt on 12/24/2020 with NP Juluis Mire.

## 2020-12-23 ENCOUNTER — Telehealth: Payer: Self-pay

## 2020-12-23 NOTE — Telephone Encounter (Signed)
Transition Care Management Unsuccessful Follow-up Telephone Call  Date of discharge and from where:  12/19/2020, [facility]   Attempts:  2nd Attempt  Reason for unsuccessful TCM follow-up call:  Left voice message on # [phone].    Patient has appointment with Juluis Mire, NP 12/24/2020 @ [facility]

## 2020-12-23 NOTE — Telephone Encounter (Signed)
Attempted to contact patient to schedule a Palliative Care consult appointment. No answer left a message to return call.  

## 2020-12-24 ENCOUNTER — Telehealth: Payer: Self-pay

## 2020-12-24 ENCOUNTER — Inpatient Hospital Stay (INDEPENDENT_AMBULATORY_CARE_PROVIDER_SITE_OTHER): Payer: Medicare Other | Admitting: Primary Care

## 2020-12-24 NOTE — Telephone Encounter (Signed)
Transition Care Management Follow-up Telephone Call Date of discharge and from where: 12/19/2020, [facility]  How have you been since you were released from the hospital? He said he was doing pretty good. At HD at the time of this call.  He attends [facility] [place]. Any questions or concerns? No  Items Reviewed: Did the pt receive and understand the discharge instructions provided? Yes  Medications obtained and verified? Yes  - he said that he has all of his medications including the hydralazine and he did not have any questions about the med regime.  Other? No  Any new allergies since your discharge? No  Do you have support at home? Yes , his wife  [place] and Equipment/Supplies: Were home health services ordered? no If so, what is the name of the agency? N/a  Has the agency set up a time to come to the patient's home? not applicable Were any new equipment or medical supplies ordered?  No What is the name of the medical supply agency? N/a Were you able to get the supplies/equipment? not applicable Do you have any questions related to the use of the equipment or supplies? No  Referral had been made to [facility] but he has not heard from the agency yet.   Functional Questionnaire: (I = Independent and D = Dependent) ADLs: independent, he stated that he manages his own medications. He said that he has a wheelchair to use if needed    Follow up appointments reviewed:  PCP Hospital f/u appt confirmed? Yes  Scheduled to see Juluis Mire, NP on 01/03/2021 @ 1050. [facility] f/u appt confirmed? Yes  Scheduled to see [facility]- 01/22/2021  Are transportation arrangements needed? No , he drives If their condition worsens, is the pt aware to call PCP or go to the Emergency Dept.? Yes Was the patient provided with contact information for the PCP's office or ED? Yes Was to pt encouraged to call back with questions or concerns? Yes

## 2020-12-25 ENCOUNTER — Other Ambulatory Visit: Payer: Self-pay | Admitting: *Deleted

## 2020-12-25 NOTE — Patient Outreach (Signed)
[facility]) Care Management  12/25/2020  Albert Harris 1955-11-30 [id number]    Referral Date: 7/15 Referral Source: Hospital liaison Referral Reason: Post hospital discharge Insurance: Traditional Medicare     Outreach attempt #2, unsuccessful to member and wife.  HIPAA compliant voice messages left on both numbers.     Plan: RN CM will follow up within the next 3-4 business days.  Albert Harris, [place], MSN [place] [phone]

## 2020-12-26 ENCOUNTER — Other Ambulatory Visit: Payer: Self-pay

## 2020-12-30 ENCOUNTER — Other Ambulatory Visit: Payer: Self-pay | Admitting: *Deleted

## 2020-12-30 ENCOUNTER — Encounter (HOSPITAL_COMMUNITY): Payer: Self-pay

## 2020-12-30 ENCOUNTER — Other Ambulatory Visit: Payer: Self-pay

## 2020-12-30 MED ORDER — CALCITRIOL 0.5 MCG PO CAPS
ORAL_CAPSULE | ORAL | 5 refills | Status: DC
  Filled 2020-12-30: qty 30, 30d supply, fill #0
  Filled 2021-02-05: qty 30, 30d supply, fill #1
  Filled 2021-03-05: qty 30, 30d supply, fill #2
  Filled 2021-04-04: qty 30, 30d supply, fill #3

## 2020-12-30 NOTE — Patient Outreach (Signed)
[facility]) Care Management  12/30/2020  Albert Harris Nov 05, 1955 [id number]   Referral Date: 7/15 Referral Source: Hospital liaison Referral Reason: Post hospital discharge Insurance: Traditional Medicare     Outreach attempt #3, unsuccessful to member and wife.  HIPAA compliant voice messages left on both numbers.  Will make 4th and final attempt within the next 3 weeks.  If remain unsuccessful, will close case due to inability to establish contact.  Will also reach out to PCP office to request assistance with contact/call back.  Albert Harris, [place], MSN [place] [phone]

## 2021-01-02 ENCOUNTER — Encounter (HOSPITAL_COMMUNITY): Payer: Self-pay

## 2021-01-02 ENCOUNTER — Other Ambulatory Visit: Payer: Self-pay

## 2021-01-02 MED ORDER — CALCIUM ACETATE (PHOS BINDER) 667 MG PO CAPS
ORAL_CAPSULE | ORAL | 6 refills | Status: DC
  Filled 2021-01-02: qty 180, 30d supply, fill #0
  Filled 2021-04-04: qty 180, 30d supply, fill #1

## 2021-01-03 ENCOUNTER — Ambulatory Visit (INDEPENDENT_AMBULATORY_CARE_PROVIDER_SITE_OTHER): Payer: Medicare Other | Admitting: Primary Care

## 2021-01-03 ENCOUNTER — Other Ambulatory Visit: Payer: Self-pay

## 2021-01-13 ENCOUNTER — Telehealth: Payer: Self-pay

## 2021-01-13 NOTE — Telephone Encounter (Signed)
Attempted to contact patient on both contact numbers and also spouse Bethena Roys to schedule a Palliative Care consult appointment. No answer left a message to return call.

## 2021-01-16 ENCOUNTER — Other Ambulatory Visit: Payer: Self-pay

## 2021-01-16 ENCOUNTER — Encounter (HOSPITAL_COMMUNITY): Payer: Self-pay

## 2021-01-16 MED ORDER — LOSARTAN POTASSIUM 50 MG PO TABS
50.0000 mg | ORAL_TABLET | Freq: Every evening | ORAL | 11 refills | Status: DC
  Filled 2021-01-16: qty 30, 30d supply, fill #0

## 2021-01-20 ENCOUNTER — Other Ambulatory Visit: Payer: Self-pay | Admitting: *Deleted

## 2021-01-20 ENCOUNTER — Other Ambulatory Visit: Payer: Self-pay

## 2021-01-20 NOTE — Patient Outreach (Addendum)
[facility]) Care Management  01/20/2021  Albert Harris 09/24/1955 [id number]   Referral Date: 7/15 Referral Source: Hospital liaison Referral Reason: Post hospital discharge Insurance: Traditional Medicare     Outreach attempt #4, unsuccessful to member and wife.  HIPAA compliant voice messages left on both numbers.  No response from member after multiple unsuccessful outreach attempts and letter sent.  Will close case at this time due to inability to maintain contact.  Will notify member and primary MD of case closure.   Valente David, [place], MSN [place] [phone]

## 2021-01-22 ENCOUNTER — Ambulatory Visit: Payer: Medicare Other | Admitting: Vascular Surgery

## 2021-01-22 ENCOUNTER — Telehealth: Payer: Self-pay

## 2021-01-22 ENCOUNTER — Inpatient Hospital Stay (HOSPITAL_COMMUNITY): Admission: RE | Admit: 2021-01-22 | Payer: Medicare Other | Source: Ambulatory Visit

## 2021-01-22 NOTE — Telephone Encounter (Signed)
After several attempts to contact patient via phone and Mychart to schedule a Palliative Care consult appointment with no response will cancel referral and notify PCP.

## 2021-01-27 ENCOUNTER — Inpatient Hospital Stay (INDEPENDENT_AMBULATORY_CARE_PROVIDER_SITE_OTHER): Payer: Medicare Other | Admitting: Primary Care

## 2021-02-05 ENCOUNTER — Encounter (HOSPITAL_COMMUNITY): Payer: Self-pay

## 2021-02-05 ENCOUNTER — Other Ambulatory Visit: Payer: Self-pay

## 2021-02-07 ENCOUNTER — Other Ambulatory Visit: Payer: Self-pay

## 2021-02-28 ENCOUNTER — Ambulatory Visit (INDEPENDENT_AMBULATORY_CARE_PROVIDER_SITE_OTHER): Payer: Medicare Other | Admitting: Primary Care

## 2021-03-05 ENCOUNTER — Other Ambulatory Visit: Payer: Self-pay

## 2021-03-05 ENCOUNTER — Encounter (HOSPITAL_COMMUNITY): Payer: Self-pay

## 2021-04-04 ENCOUNTER — Encounter (HOSPITAL_COMMUNITY): Payer: Self-pay

## 2021-04-04 ENCOUNTER — Other Ambulatory Visit: Payer: Self-pay

## 2021-04-07 ENCOUNTER — Other Ambulatory Visit: Payer: Self-pay

## 2021-04-10 ENCOUNTER — Other Ambulatory Visit: Payer: Self-pay

## 2021-05-05 ENCOUNTER — Emergency Department (HOSPITAL_COMMUNITY): Payer: Medicare Other

## 2021-05-05 ENCOUNTER — Encounter (HOSPITAL_COMMUNITY): Payer: Self-pay | Admitting: Emergency Medicine

## 2021-05-05 ENCOUNTER — Encounter (HOSPITAL_COMMUNITY): Payer: Self-pay

## 2021-05-05 ENCOUNTER — Other Ambulatory Visit: Payer: Self-pay

## 2021-05-05 ENCOUNTER — Inpatient Hospital Stay (HOSPITAL_COMMUNITY)
Admission: EM | Admit: 2021-05-05 | Discharge: 2021-05-13 | DRG: 640 | Disposition: A | Discharge status: Home Health | Payer: Medicare Other | Attending: Internal Medicine | Admitting: Internal Medicine

## 2021-05-05 DIAGNOSIS — R7989 Other specified abnormal findings of blood chemistry: Secondary | ICD-10-CM | POA: Diagnosis not present

## 2021-05-05 DIAGNOSIS — K7200 Acute and subacute hepatic failure without coma: Secondary | ICD-10-CM | POA: Diagnosis present

## 2021-05-05 DIAGNOSIS — Y92009 Unspecified place in unspecified non-institutional (private) residence as the place of occurrence of the external cause: Secondary | ICD-10-CM

## 2021-05-05 DIAGNOSIS — Z87891 Personal history of nicotine dependence: Secondary | ICD-10-CM | POA: Diagnosis not present

## 2021-05-05 DIAGNOSIS — Z20822 Contact with and (suspected) exposure to covid-19: Secondary | ICD-10-CM | POA: Diagnosis present

## 2021-05-05 DIAGNOSIS — W01190A Fall on same level from slipping, tripping and stumbling with subsequent striking against furniture, initial encounter: Secondary | ICD-10-CM | POA: Diagnosis present

## 2021-05-05 DIAGNOSIS — E8770 Fluid overload, unspecified: Secondary | ICD-10-CM | POA: Diagnosis present

## 2021-05-05 DIAGNOSIS — E785 Hyperlipidemia, unspecified: Secondary | ICD-10-CM | POA: Diagnosis present

## 2021-05-05 DIAGNOSIS — E876 Hypokalemia: Secondary | ICD-10-CM | POA: Diagnosis not present

## 2021-05-05 DIAGNOSIS — I739 Peripheral vascular disease, unspecified: Secondary | ICD-10-CM | POA: Diagnosis present

## 2021-05-05 DIAGNOSIS — Z9115 Patient's noncompliance with renal dialysis: Secondary | ICD-10-CM

## 2021-05-05 DIAGNOSIS — D631 Anemia in chronic kidney disease: Secondary | ICD-10-CM | POA: Diagnosis present

## 2021-05-05 DIAGNOSIS — I253 Aneurysm of heart: Secondary | ICD-10-CM | POA: Diagnosis present

## 2021-05-05 DIAGNOSIS — N2581 Secondary hyperparathyroidism of renal origin: Secondary | ICD-10-CM | POA: Diagnosis present

## 2021-05-05 DIAGNOSIS — K5730 Diverticulosis of large intestine without perforation or abscess without bleeding: Secondary | ICD-10-CM | POA: Diagnosis present

## 2021-05-05 DIAGNOSIS — E162 Hypoglycemia, unspecified: Secondary | ICD-10-CM | POA: Diagnosis present

## 2021-05-05 DIAGNOSIS — D696 Thrombocytopenia, unspecified: Secondary | ICD-10-CM | POA: Diagnosis present

## 2021-05-05 DIAGNOSIS — B9562 Methicillin resistant Staphylococcus aureus infection as the cause of diseases classified elsewhere: Secondary | ICD-10-CM | POA: Diagnosis present

## 2021-05-05 DIAGNOSIS — R7881 Bacteremia: Secondary | ICD-10-CM | POA: Diagnosis present

## 2021-05-05 DIAGNOSIS — Q2112 Patent foramen ovale: Secondary | ICD-10-CM | POA: Diagnosis not present

## 2021-05-05 DIAGNOSIS — G9341 Metabolic encephalopathy: Secondary | ICD-10-CM | POA: Diagnosis not present

## 2021-05-05 DIAGNOSIS — Z8249 Family history of ischemic heart disease and other diseases of the circulatory system: Secondary | ICD-10-CM

## 2021-05-05 DIAGNOSIS — Y92003 Bedroom of unspecified non-institutional (private) residence as the place of occurrence of the external cause: Secondary | ICD-10-CM | POA: Diagnosis not present

## 2021-05-05 DIAGNOSIS — R066 Hiccough: Secondary | ICD-10-CM | POA: Diagnosis not present

## 2021-05-05 DIAGNOSIS — Z992 Dependence on renal dialysis: Secondary | ICD-10-CM | POA: Diagnosis not present

## 2021-05-05 DIAGNOSIS — W19XXXA Unspecified fall, initial encounter: Secondary | ICD-10-CM

## 2021-05-05 DIAGNOSIS — I120 Hypertensive chronic kidney disease with stage 5 chronic kidney disease or end stage renal disease: Secondary | ICD-10-CM | POA: Diagnosis present

## 2021-05-05 DIAGNOSIS — I340 Nonrheumatic mitral (valve) insufficiency: Secondary | ICD-10-CM | POA: Diagnosis not present

## 2021-05-05 DIAGNOSIS — J449 Chronic obstructive pulmonary disease, unspecified: Secondary | ICD-10-CM | POA: Diagnosis present

## 2021-05-05 DIAGNOSIS — I959 Hypotension, unspecified: Secondary | ICD-10-CM | POA: Diagnosis not present

## 2021-05-05 DIAGNOSIS — Y9301 Activity, walking, marching and hiking: Secondary | ICD-10-CM | POA: Diagnosis present

## 2021-05-05 DIAGNOSIS — N186 End stage renal disease: Secondary | ICD-10-CM | POA: Diagnosis present

## 2021-05-05 DIAGNOSIS — I6529 Occlusion and stenosis of unspecified carotid artery: Secondary | ICD-10-CM | POA: Diagnosis present

## 2021-05-05 DIAGNOSIS — E875 Hyperkalemia: Secondary | ICD-10-CM | POA: Diagnosis not present

## 2021-05-05 DIAGNOSIS — K219 Gastro-esophageal reflux disease without esophagitis: Secondary | ICD-10-CM | POA: Diagnosis present

## 2021-05-05 DIAGNOSIS — Z8701 Personal history of pneumonia (recurrent): Secondary | ICD-10-CM

## 2021-05-05 DIAGNOSIS — I10 Essential (primary) hypertension: Secondary | ICD-10-CM | POA: Diagnosis present

## 2021-05-05 DIAGNOSIS — Z79899 Other long term (current) drug therapy: Secondary | ICD-10-CM

## 2021-05-05 DIAGNOSIS — Z8673 Personal history of transient ischemic attack (TIA), and cerebral infarction without residual deficits: Secondary | ICD-10-CM

## 2021-05-05 DIAGNOSIS — M898X9 Other specified disorders of bone, unspecified site: Secondary | ICD-10-CM | POA: Diagnosis present

## 2021-05-05 DIAGNOSIS — M545 Low back pain, unspecified: Secondary | ICD-10-CM | POA: Diagnosis present

## 2021-05-05 DIAGNOSIS — R1011 Right upper quadrant pain: Secondary | ICD-10-CM

## 2021-05-05 LAB — COMPREHENSIVE METABOLIC PANEL
ALT: 2294 U/L — ABNORMAL HIGH (ref 0–44)
AST: 2428 U/L — ABNORMAL HIGH (ref 15–41)
Albumin: 3.6 g/dL (ref 3.5–5.0)
Alkaline Phosphatase: 109 U/L (ref 38–126)
Anion gap: 27 — ABNORMAL HIGH (ref 5–15)
BUN: 103 mg/dL — ABNORMAL HIGH (ref 8–23)
CO2: 17 mmol/L — ABNORMAL LOW (ref 22–32)
Calcium: 10.1 mg/dL (ref 8.9–10.3)
Chloride: 95 mmol/L — ABNORMAL LOW (ref 98–111)
Creatinine, Ser: 16.09 mg/dL — ABNORMAL HIGH (ref 0.61–1.24)
GFR, Estimated: 3 mL/min — ABNORMAL LOW (ref >60)
Glucose, Bld: 43 mg/dL — CL (ref 70–99)
Potassium: 7 mmol/L (ref 3.5–5.1)
Sodium: 139 mmol/L (ref 135–145)
Total Bilirubin: 2.1 mg/dL — ABNORMAL HIGH (ref 0.3–1.2)
Total Protein: 6.6 g/dL (ref 6.5–8.1)

## 2021-05-05 LAB — CBC WITH DIFFERENTIAL/PLATELET
Abs Immature Granulocytes: 0.1 10*3/uL — ABNORMAL HIGH (ref 0.00–0.07)
Basophils Absolute: 0 10*3/uL (ref 0.0–0.1)
Basophils Relative: 0 %
Eosinophils Absolute: 0 10*3/uL (ref 0.0–0.5)
Eosinophils Relative: 0 %
HCT: 38.5 % — ABNORMAL LOW (ref 39.0–52.0)
Hemoglobin: 12 g/dL — ABNORMAL LOW (ref 13.0–17.0)
Immature Granulocytes: 1 %
Lymphocytes Relative: 11 %
Lymphs Abs: 1.5 10*3/uL (ref 0.7–4.0)
MCH: 31.6 pg (ref 26.0–34.0)
MCHC: 31.2 g/dL (ref 30.0–36.0)
MCV: 101.3 fL — ABNORMAL HIGH (ref 80.0–100.0)
Monocytes Absolute: 0.9 10*3/uL (ref 0.1–1.0)
Monocytes Relative: 7 %
Neutro Abs: 10.5 10*3/uL — ABNORMAL HIGH (ref 1.7–7.7)
Neutrophils Relative %: 81 %
Platelets: 102 10*3/uL — ABNORMAL LOW (ref 150–400)
RBC: 3.8 MIL/uL — ABNORMAL LOW (ref 4.22–5.81)
RDW: 15.9 % — ABNORMAL HIGH (ref 11.5–15.5)
WBC: 13 10*3/uL — ABNORMAL HIGH (ref 4.0–10.5)
nRBC: 0.2 % (ref 0.0–0.2)

## 2021-05-05 LAB — BRAIN NATRIURETIC PEPTIDE: B Natriuretic Peptide: >4500 pg/mL — ABNORMAL HIGH (ref 0.0–100.0)

## 2021-05-05 LAB — CBG MONITORING, ED
Glucose-Capillary: 38 mg/dL — CL (ref 70–99)
Glucose-Capillary: 67 mg/dL — ABNORMAL LOW (ref 70–99)
Glucose-Capillary: 152 mg/dL — ABNORMAL HIGH (ref 70–99)

## 2021-05-05 LAB — RESP PANEL BY RT-PCR (FLU A&B, COVID) ARPGX2
Influenza A by PCR: NEGATIVE
Influenza B by PCR: NEGATIVE
SARS Coronavirus 2 by RT PCR: NEGATIVE

## 2021-05-05 MED ORDER — ALBUTEROL SULFATE (2.5 MG/3ML) 0.083% IN NEBU: 2.5000 mg | INHALATION_SOLUTION | RESPIRATORY_TRACT | Status: DC | PRN

## 2021-05-05 MED ORDER — HYDROMORPHONE HCL 1 MG/ML IJ SOLN
0.5000 mg | INTRAMUSCULAR | Status: DC | PRN
  Administered 2021-05-06: 0.5 mg via INTRAVENOUS

## 2021-05-05 MED ORDER — CALCIUM GLUCONATE-NACL 1-0.675 GM/50ML-% IV SOLN
1.0000 g | Freq: Once | INTRAVENOUS | Status: AC
  Administered 2021-05-05: 20:00:00 1000 mg via INTRAVENOUS
  Filled 2021-05-05: qty 50

## 2021-05-05 MED ORDER — INSULIN ASPART 100 UNIT/ML IV SOLN
5.0000 [IU] | Freq: Once | INTRAVENOUS | Status: AC
  Administered 2021-05-05: 20:00:00 5 [IU] via INTRAVENOUS

## 2021-05-05 MED ORDER — HEPARIN SODIUM (PORCINE) 5000 UNIT/ML IJ SOLN
5000.0000 [IU] | Freq: Three times a day (TID) | INTRAMUSCULAR | Status: DC
  Administered 2021-05-06 – 2021-05-13 (×19): 5000 [IU] via SUBCUTANEOUS
  Filled 2021-05-05 (×17): qty 1

## 2021-05-05 MED ORDER — ONDANSETRON HCL 4 MG PO TABS: 4.0000 mg | ORAL_TABLET | Freq: Four times a day (QID) | ORAL | Status: DC | PRN

## 2021-05-05 MED ORDER — SODIUM CHLORIDE 0.9% FLUSH
3.0000 mL | Freq: Two times a day (BID) | INTRAVENOUS | Status: DC
  Administered 2021-05-06 – 2021-05-13 (×12): 3 mL via INTRAVENOUS

## 2021-05-05 MED ORDER — CHLORHEXIDINE GLUCONATE CLOTH 2 % EX PADS
6.0000 | MEDICATED_PAD | Freq: Every day | CUTANEOUS | Status: DC
  Administered 2021-05-06 – 2021-05-07 (×2): 6 via TOPICAL

## 2021-05-05 MED ORDER — DEXTROSE 50 % IV SOLN: 1.0000 | INTRAVENOUS | Status: DC | PRN

## 2021-05-05 MED ORDER — FENTANYL CITRATE PF 50 MCG/ML IJ SOSY
50.0000 ug | PREFILLED_SYRINGE | Freq: Once | INTRAMUSCULAR | Status: AC
  Administered 2021-05-05: 21:00:00 50 ug via INTRAVENOUS
  Filled 2021-05-05: qty 1

## 2021-05-05 MED ORDER — SODIUM BICARBONATE 8.4 % IV SOLN
50.0000 meq | Freq: Once | INTRAVENOUS | Status: AC
  Administered 2021-05-05: 20:00:00 50 meq via INTRAVENOUS
  Filled 2021-05-05: qty 50

## 2021-05-05 MED ORDER — IOHEXOL 350 MG/ML SOLN
75.0000 mL | Freq: Once | INTRAVENOUS | Status: AC | PRN
  Administered 2021-05-05: 22:00:00 75 mL via INTRAVENOUS

## 2021-05-05 MED ORDER — DEXTROSE 50 % IV SOLN
1.0000 | Freq: Once | INTRAVENOUS | Status: AC
  Administered 2021-05-05: 20:00:00 50 mL via INTRAVENOUS
  Filled 2021-05-05: qty 50

## 2021-05-05 MED ORDER — SENNOSIDES-DOCUSATE SODIUM 8.6-50 MG PO TABS: 1.0000 | ORAL_TABLET | Freq: Every evening | ORAL | Status: DC | PRN

## 2021-05-05 MED ORDER — DEXTROSE 50 % IV SOLN
50.0000 mL | Freq: Once | INTRAVENOUS | Status: AC
  Administered 2021-05-05: 18:00:00 50 mL via INTRAVENOUS

## 2021-05-05 MED ORDER — ONDANSETRON HCL 4 MG/2ML IJ SOLN
4.0000 mg | Freq: Four times a day (QID) | INTRAMUSCULAR | Status: DC | PRN
  Administered 2021-05-09: 4 mg via INTRAVENOUS
  Filled 2021-05-05: qty 2

## 2021-05-05 NOTE — ED Notes (Addendum)
Dr Marval Regal at bedside.

## 2021-05-05 NOTE — ED Provider Notes (Signed)
[facility] EMERGENCY DEPARTMENT Provider Note   CSN: [id number] Arrival date & time: 05/05/21  1430     History Chief Complaint  Patient presents with   [facility] Albert Harris is a 65 y.o. male.  The history is provided by the patient.  Dizziness Quality:  Lightheadedness Severity:  Moderate Onset quality:  Gradual Duration:  2 days Timing:  Constant Progression:  Waxing and waning Context: standing up   Relieved by:  Being still Worsened by:  Standing up Associated symptoms: chest pain, nausea and vomiting   Associated symptoms: no palpitations and no shortness of breath   Chest pain:    Quality: aching (R subcostal margin)     Severity:  Moderate   Onset quality:  Sudden     Past Medical History:  Diagnosis Date   Anemia    Asthma    Carotid stenosis 09/12/2019   Chronic kidney disease    t, th. sat dialysis   Claudication in peripheral vascular disease ([place]) 12/10/9447   Complication of anesthesia    " i HAVE A HARD TIME WAKING UP " (only one time)   COPD (chronic obstructive pulmonary disease) ([place])    COVID-19    positive on  06/16/20   GERD (gastroesophageal reflux disease)    HLD (hyperlipidemia)    Hypertension    Peripheral vascular disease (HCC)    PFO with atrial septal aneurysm    by echo 11/2017   Pneumonia    Stroke [facility])    [name] (transient ischemic attack) 11/2017   Tobacco abuse    quit 07/17/19    Patient Active Problem List   Diagnosis Date Noted   Hyperkalemia 05/05/2021   Goals of care, counseling/discussion    Abdominal swelling    Acute on chronic diastolic CHF (congestive heart failure) ([place]) 12/15/2020   SOB (shortness of breath) 12/15/2020   Hypertension    COPD (chronic obstructive pulmonary disease) (HCC)    Volume overload 12/14/2020   ESRD (end stage renal disease) ([place])    Lower GI bleed    Pressure injury of skin 06/28/2020   Acute renal failure superimposed on stage 5 chronic kidney disease, not on  chronic dialysis ([place])    Acute respiratory disease due to COVID-19 virus 06/16/2020   CKD (chronic kidney disease) stage 5, GFR less than 15 ml/min ([place]) 03/19/2020   Claudication in peripheral vascular disease ([place]) 09/12/2019   Carotid stenosis 09/12/2019   PFO (patent foramen ovale) 08/10/2019   Noncompliance 08/10/2019   Hx of transient ischemic attack ([name]) 08/10/2019   Chronic diastolic heart failure (HCC)    Elevated troponin    Hypertensive emergency    Acute respiratory failure with hypoxia ([place]) 07/18/2019   Acute pulmonary edema ([place]) 07/18/2019   ARF (acute renal failure) ([place]) 07/18/2019   Anemia 07/18/2019   Right sided weakness 11/09/2017   CKD (chronic kidney disease), stage III ([place]) 11/09/2017   Tobacco abuse 01/19/2017   Hypertensive urgency 12/24/2016   [name] (transient ischemic attack) 12/24/2016    Past Surgical History:  Procedure Laterality Date   ABDOMINAL AORTOGRAM W/LOWER EXTREMITY N/A 06/21/2020   Procedure: ABDOMINAL AORTOGRAM W/LOWER EXTREMITY;  Surgeon: Angelia Mould, MD;  Location: [place] [facility];  Service: Cardiovascular;  Laterality: N/A;   AV FISTULA PLACEMENT Left 03/28/2020   Procedure: ARTERIOVENOUS (AV) FISTULA CREATION LEFT;  Surgeon: [name], MD;  Location: [place];  Service: Vascular;  Laterality: Left;   AV FISTULA  PLACEMENT Right 09/23/2020   Procedure: RIGHT Arm ARTERIOVENOUS GRAFT CREATION.;  Surgeon: Angelia Mould, MD;  Location: [place];  Service: Vascular;  Laterality: Right;   COLONOSCOPY     COLONOSCOPY WITH PROPOFOL N/A 06/27/2020   Procedure: COLONOSCOPY WITH PROPOFOL;  Surgeon: Carol Ada, MD;  Location: [place];  Service: Endoscopy;  Laterality: N/A;   FINGER SURGERY     HEMOSTASIS CLIP PLACEMENT  06/27/2020   Procedure: HEMOSTASIS CLIP PLACEMENT;  Surgeon: Carol Ada, MD;  Location: [place];  Service: Endoscopy;;   INSERTION OF DIALYSIS CATHETER Right 06/24/2020   Procedure: INSERTION  OF TUNNELED  DIALYSIS CATHETER; Removal of temporary dialysis catheter right neck;  Surgeon: Angelia Mould, MD;  Location: [place];  Service: Vascular;  Laterality: Right;   LIGATION OF ARTERIOVENOUS  FISTULA Left 07/24/2020   Procedure: LIGATION OF LEFT BRACHIOCEPHALIC ARTERIOVENOUS  FISTULA;  Surgeon: [name], MD;  Location: [place];  Service: Vascular;  Laterality: Left;   PERIPHERAL VASCULAR INTERVENTION Right 06/21/2020   Procedure: PERIPHERAL VASCULAR INTERVENTION;  Surgeon: Angelia Mould, MD;  Location: [place] [facility];  Service: Cardiovascular;  Laterality: Right;       Family History  Problem Relation Age of Onset   Hypertension Mother    Colon cancer Father    Stomach cancer Brother     Social History   Tobacco Use   Smoking status: Former    Packs/day: 1.00    Years: 50.00    Pack years: 50.00    Types: Cigarettes    Quit date: 07/17/2019    Years since quitting: 1.8   Smokeless tobacco: Never  Vaping Use   Vaping Use: Never used  Substance Use Topics   Alcohol use: Not Currently    Alcohol/week: 1.0 - 3.0 standard drink    Types: 1 - 3 Cans of beer per week   Drug use: No    Home Medications Prior to Admission medications   Medication Sig Start Date End Date Taking? Authorizing Provider  albuterol (VENTOLIN HFA) 108 (90 Base) MCG/ACT inhaler INHALE 1-2 PUFFS INTO THE LUNGS EVERY 6 (SIX) HOURS AS NEEDED FOR WHEEZING OR SHORTNESS OF BREATH. 07/02/20 07/02/21 Yes Maness, Arnette Norris, MD  amLODipine (NORVASC) 10 MG tablet Take 1 tablet (10 mg total) by mouth in the morning. 11/27/20  Yes Kerin Perna, NP  B Complex-C-Zn-Folic Acid (DIALYVITE 209 WITH ZINC) 0.8 MG TABS Take 1 tablet by mouth at bedtime. 04/03/21  Yes [provider]  calcium acetate (PHOSLO) 667 MG capsule Take 2 capsule by mouth three times a day with meals 01/02/21  Yes   lidocaine-prilocaine (EMLA) cream Apply 1 application topically See admin instructions. Apply a  small amount to access site 1-2 hours before dialysis,cover with occlusive dressing(saran wrap) 11/21/20  Yes [provider]  metoprolol tartrate (LOPRESSOR) 25 MG tablet TAKE 1 TABLET BY MOUTH TWICE A DAY 07/25/20 07/25/21 Yes Corliss Parish, MD  nitroGLYCERIN (NITROSTAT) 0.4 MG SL tablet Place 1 tablet (0.4 mg total) under the tongue every 5 (five) minutes x 3 doses as needed for chest pain. 07/23/19  Yes Aline August, MD  sodium bicarbonate 650 MG tablet Take 650 mg by mouth daily as needed for heartburn. 02/29/20  Yes [provider]  aspirin 81 MG EC tablet TAKE 1 TABLET (81 MG TOTAL) BY MOUTH DAILY. SWALLOW WHOLE. Patient not taking: Reported on 05/05/2021 07/02/20 07/02/21  Delora Fuel, MD  calcitRIOL (ROCALTROL) 0.5 MCG capsule Take 1 capsule by mouth  every morning as directed Patient not taking: Reported on 05/05/2021 12/28/20     calcium acetate (PHOSLO) 667 MG capsule TAKE 2 CAPSULES BY MOUTH THREE TIMES DAILY WITH MEALS Patient not taking: Reported on 05/05/2021 08/13/20 08/13/21  Corliss Parish, MD  furosemide (LASIX) 40 MG tablet TAKE 1 TABLET BY MOUTH TWICE A DAY, AT 8AM AND 2PM 04/23/20 04/23/21  Elmarie Shiley, MD  hydrALAZINE (APRESOLINE) 50 MG tablet Take 1 tablet (50 mg total) by mouth every 8 (eight) hours. 12/19/20 01/18/21  Arrien, Jimmy Picket, MD  hydrOXYzine (ATARAX/VISTARIL) 50 MG tablet Take 1 tablet by mouth four times a day as needed for itching on dialysis days Patient not taking: Reported on 05/05/2021 11/19/20     losartan (COZAAR) 50 MG tablet Take 1 tablet by mouth every evening Patient not taking: Reported on 05/05/2021 01/16/21     ondansetron (ZOFRAN ODT) 4 MG disintegrating tablet Take 1 tablet (4 mg total) by mouth every 8 (eight) hours as needed for nausea or vomiting. Patient not taking: Reported on 05/05/2021 11/27/20   Kerin Perna, NP  oxyCODONE-acetaminophen (PERCOCET) 7.5-325 MG tablet Take 1 tablet by mouth every 4 (four)  hours as needed for severe pain. Patient not taking: Reported on 05/05/2021 09/23/20 09/23/21  Barbie Banner, PA-C    Allergies    Patient has no known allergies.  Review of Systems   Review of Systems  Constitutional:  Negative for chills and fever.  HENT:  Negative for ear pain and sore throat.   Eyes:  Negative for pain and visual disturbance.  Respiratory:  Negative for cough and shortness of breath.   Cardiovascular:  Positive for chest pain. Negative for palpitations.  Gastrointestinal:  Positive for nausea and vomiting. Negative for abdominal pain.  Genitourinary:  Negative for dysuria and hematuria.  Musculoskeletal:  Negative for arthralgias and back pain.  Skin:  Negative for color change and rash.  Neurological:  Positive for dizziness. Negative for seizures and syncope.  All other systems reviewed and are negative.  Physical Exam Updated Vital Signs BP (!) 190/115   Pulse 74   Temp 97.8 F (36.6 C) (Oral)   Resp (!) 22   SpO2 95%   Physical Exam Vitals and nursing note reviewed.  Constitutional:      General: He is not in acute distress.    Appearance: Normal appearance. He is well-developed. He is not ill-appearing.  HENT:     Head: Normocephalic and atraumatic.     Right Ear: External ear normal.     Left Ear: External ear normal.     Nose: Nose normal. No congestion.     Mouth/Throat:     Mouth: Mucous membranes are moist.     Pharynx: Oropharynx is clear. No posterior oropharyngeal erythema.  Eyes:     Extraocular Movements: Extraocular movements intact.     Conjunctiva/sclera: Conjunctivae normal.     Pupils: Pupils are equal, round, and reactive to light.  Cardiovascular:     Rate and Rhythm: Normal rate and regular rhythm.     Pulses: Normal pulses.     Heart sounds: No murmur heard. Pulmonary:     Effort: Pulmonary effort is normal. No respiratory distress.     Breath sounds: Normal breath sounds. No wheezing, rhonchi or rales.  Abdominal:      General: Abdomen is flat. Bowel sounds are normal.     Palpations: Abdomen is soft.     Tenderness: There is abdominal tenderness (RUQ). There is no  right CVA tenderness, left CVA tenderness, guarding or rebound.  Musculoskeletal:        General: Tenderness (Tenderness to palpation of right lower chest wall and subcostal margin, no overlying ecchymosis or abrasions) present. No swelling or deformity. Normal range of motion.     Cervical back: Normal range of motion and neck supple. No rigidity.  Skin:    General: Skin is warm and dry.     Capillary Refill: Capillary refill takes less than 2 seconds.     Findings: No rash.  Neurological:     General: No focal deficit present.     Mental Status: He is alert and oriented to person, place, and time.     Cranial Nerves: No cranial nerve deficit.     Sensory: No sensory deficit.     Coordination: Coordination normal.     Gait: Gait normal.  Psychiatric:        Mood and Affect: Mood normal.    ED Results / Procedures / Treatments   Labs (all labs ordered are listed, but only abnormal results are displayed) Labs Reviewed  COMPREHENSIVE METABOLIC PANEL - Abnormal; Notable for the following components:      Result Value   Potassium 7.0 (*)    Chloride 95 (*)    CO2 17 (*)    Glucose, Bld 43 (*)    BUN 103 (*)    Creatinine, Ser 16.09 (*)    AST 2,428 (*)    ALT 2,294 (*)    Total Bilirubin 2.1 (*)    GFR, Estimated 3 (*)    Anion gap 27 (*)    All other components within normal limits  CBC WITH DIFFERENTIAL/PLATELET - Abnormal; Notable for the following components:   WBC 13.0 (*)    RBC 3.80 (*)    Hemoglobin 12.0 (*)    HCT 38.5 (*)    MCV 101.3 (*)    RDW 15.9 (*)    Platelets 102 (*)    Neutro Abs 10.5 (*)    Abs Immature Granulocytes 0.10 (*)    All other components within normal limits  BRAIN NATRIURETIC PEPTIDE - Abnormal; Notable for the following components:   B Natriuretic Peptide >4,500.0 (*)    All other  components within normal limits  CBG MONITORING, ED - Abnormal; Notable for the following components:   Glucose-Capillary 38 (*)    All other components within normal limits  CBG MONITORING, ED - Abnormal; Notable for the following components:   Glucose-Capillary 67 (*)    All other components within normal limits  CBG MONITORING, ED - Abnormal; Notable for the following components:   Glucose-Capillary 152 (*)    All other components within normal limits  RESP PANEL BY RT-PCR (FLU A&B, COVID) [id number]  ACETAMINOPHEN LEVEL  SALICYLATE LEVEL  ETHANOL    EKG EKG Interpretation  Date/Time:  Monday May 05 2021 18:23:41 EST Ventricular Rate:  73 PR Interval:  193 QRS Duration: 101 QT Interval:  453 QTC Calculation: 500 R Axis:   -44 Text Interpretation: Sinus rhythm Left atrial enlargement Left anterior fascicular block LVH with secondary repolarization abnormality Borderline prolonged QT interval No significant change since prior 7/22 Confirmed by Aletta Edouard [phone]) on 05/05/2021 6:28:47 PM  Radiology DG Chest 2 View  Result Date: 05/05/2021 CLINICAL DATA:  Weakness, fell last night EXAM: CHEST - 2 VIEW COMPARISON:  12/14/2020 FINDINGS: Enlargement of cardiac silhouette. Atherosclerotic calcification aorta. Slightly prominent pulmonary vascular structures at the hila unchanged. Lungs clear. No  pulmonary infiltrate, pleural effusion, or pneumothorax. Diffuse osseous demineralization. No definite fractures identified. IMPRESSION: Enlargement of cardiac silhouette. No acute abnormalities. Electronically Signed   By: Lavonia Dana M.D.   On: 05/05/2021 16:34   DG Thoracic Spine 2 View  Result Date: 05/05/2021 CLINICAL DATA:  Back pain post fall last night EXAM: THORACIC SPINE 2 VIEWS COMPARISON:  None FINDINGS: Vertebral body and disc space heights maintained. No fracture, subluxation, or bone destruction. Atherosclerotic calcifications aorta. IMPRESSION: No acute osseous  abnormalities. Aortic Atherosclerosis (ICD10-I70.0). Electronically Signed   By: Lavonia Dana M.D.   On: 05/05/2021 16:35   CT Head Wo Contrast  Result Date: 05/05/2021 CLINICAL DATA:  Trauma. EXAM: CT HEAD WITHOUT CONTRAST CT CERVICAL SPINE WITHOUT CONTRAST TECHNIQUE: Multidetector CT imaging of the head and cervical spine was performed following the standard protocol without intravenous contrast. Multiplanar CT image reconstructions of the cervical spine were also generated. COMPARISON:  None. FINDINGS: CT HEAD FINDINGS Brain: The ventricles and sulci appropriate size for patient's age. 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 There is no acute intracranial hemorrhage. No mass effect or midline shift no extra-axial fluid collection. Vascular: No hyperdense vessel or unexpected calcification. Skull: Normal. Negative for fracture or focal lesion. Sinuses/Orbits: No acute finding. Other: There is air within the superior sagittal sinus, likely iatrogenic and introduced via IV access. CT CERVICAL SPINE FINDINGS Alignment: No acute subluxation. There is straightening of normal cervical lordosis which may be positional or due to muscle spasm. Skull base and vertebrae: No acute fracture. Soft tissues and spinal canal: No prevertebral fluid or swelling. No visible canal hematoma. Disc levels: Multilevel degenerative changes with disc space narrowing and endplate irregularity and spurring. Upper chest: Negative. Other: Bilateral carotid bulb calcified plaques. IMPRESSION: 1. No acute intracranial pathology. 2. Moderate air within the superior sagittal sinus, likely iatrogenic and introduced via IV access. 3. No acute/traumatic cervical spine pathology. Multilevel degenerative changes. These results were called by telephone at the time of interpretation on 05/05/2021 at 6:51 pm to Dr. Aletta Edouard, who verbally acknowledged these results. Electronically  Signed   By: Anner Crete M.D.   On: 05/05/2021 18:53   CT Cervical Spine Wo Contrast  Result Date: 05/05/2021 CLINICAL DATA:  Trauma. EXAM: CT HEAD WITHOUT CONTRAST CT CERVICAL SPINE WITHOUT CONTRAST TECHNIQUE: Multidetector CT imaging of the head and cervical spine was performed following the standard protocol without intravenous contrast. Multiplanar CT image reconstructions of the cervical spine were also generated. COMPARISON:  None. FINDINGS: CT HEAD FINDINGS Brain: The ventricles and sulci appropriate size for patient's age. 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 There is no acute intracranial hemorrhage. No mass effect or midline shift no extra-axial fluid collection. Vascular: No hyperdense vessel or unexpected calcification. Skull: Normal. Negative for fracture or focal lesion. Sinuses/Orbits: No acute finding. Other: There is air within the superior sagittal sinus, likely iatrogenic and introduced via IV access. CT CERVICAL SPINE FINDINGS Alignment: No acute subluxation. There is straightening of normal cervical lordosis which may be positional or due to muscle spasm. Skull base and vertebrae: No acute fracture. Soft tissues and spinal canal: No prevertebral fluid or swelling. No visible canal hematoma. Disc levels: Multilevel degenerative changes with disc space narrowing and endplate irregularity and spurring. Upper chest: Negative. Other: Bilateral carotid bulb calcified plaques. IMPRESSION: 1. No acute intracranial pathology. 2. Moderate air within  the superior sagittal sinus, likely iatrogenic and introduced via IV access. 3. No acute/traumatic cervical spine pathology. Multilevel degenerative changes. These results were called by telephone at the time of interpretation on 05/05/2021 at 6:51 pm to Dr. Aletta Edouard, who verbally acknowledged these results. Electronically Signed   By: Anner Crete M.D.   On: 05/05/2021  18:53   CT ABDOMEN PELVIS W CONTRAST  Result Date: 05/05/2021 CLINICAL DATA:  Abdominal trauma.  Fall EXAM: CT ABDOMEN AND PELVIS WITH CONTRAST TECHNIQUE: Multidetector CT imaging of the abdomen and pelvis was performed using the standard protocol following bolus administration of intravenous contrast. CONTRAST:  21mL OMNIPAQUE IOHEXOL 350 MG/ML SOLN COMPARISON:  None. FINDINGS: Lower chest: Lung bases are clear. Hepatobiliary: No focal hepatic lesion. No biliary duct dilatation. Common bile duct is normal. Pancreas: Pancreas is normal. No ductal dilatation. No pancreatic inflammation. Spleen: Normal spleen Adrenals/urinary tract: Adrenal glands normal. Kidneys are atrophic. Ureters and bladder normal. Stomach/Bowel: Stomach, small bowel, appendix, and cecum are normal. Multiple diverticula of the descending colon and sigmoid colon without acute inflammation. Vascular/Lymphatic: Abdominal aorta is normal caliber with atherosclerotic calcification. There is no retroperitoneal or periportal lymphadenopathy. No pelvic lymphadenopathy. Reproductive: Unremarkable Other: Small volume of free fluid along the margin of the liver. Musculoskeletal: No aggressive osseous lesion.  No fracture. IMPRESSION: 1. No evidence of abdominal trauma. 2. Atrophic kidneys and small volume intraperitoneal free fluid. 3. LEFT colon diverticulosis. 4. Dense atherosclerotic disease of the aorta. Aortic Atherosclerosis (ICD10-I70.0). Electronically Signed   By: Suzy Bouchard M.D.   On: 05/05/2021 21:55    Procedures Procedures   Medications Ordered in ED Medications  Chlorhexidine Gluconate Cloth 2 % PADS 6 each (has no administration in time range)  dextrose 50 % solution 50 mL (50 mLs Intravenous Given 05/05/21 1740)  calcium gluconate 1 g/ 50 mL sodium chloride IVPB (1,000 mg Intravenous New Bag/Given 05/05/21 2005)  insulin aspart (novoLOG) injection 5 Units (5 Units Intravenous Given 05/05/21 1959)    And  dextrose 50 %  solution 50 mL (50 mLs Intravenous Given 05/05/21 2004)  sodium bicarbonate injection 50 mEq (50 mEq Intravenous Given 05/05/21 1956)  fentaNYL (SUBLIMAZE) injection 50 mcg (50 mcg Intravenous Given 05/05/21 2118)  iohexol (OMNIPAQUE) 350 MG/ML injection 75 mL (75 mLs Intravenous Contrast Given 05/05/21 2144)    ED Course  I have reviewed the triage vital signs and the nursing notes.  Pertinent labs & imaging results that were available during my care of the patient were reviewed by me and considered in my medical decision making (see chart for details).   MDM Rules/Calculators/A&P                          65 year old male with history of hypertension and end-stage renal disease on hemodialysis every Tuesday, Thursday, Saturday who presents with near syncopal dizziness and R lower chest wall pain status post fall.  He is hypertensive on arrival.  Found to be hypoglycemic to 36.  He was given p.o. juice and D50.  EKG obtained and showed sinus rhythm with left atrial enlargement, LAFB, LVH, borderline QT prolongation.  Chest x-ray showed cardiomegaly but no focal findings.  CT head and C-spine obtained. No acute traumatic injuries noted. Patient did have focal right subcostal tenderness on exam.  CT abdomen pelvis obtained.  No traumatic injuries noted.  Labs remarkable for hyperkalemia to 7.0, BNP >4500. He was treated temporized with calcium, insulin and d10, bicarb.  No  respiratory distress or O2 requirement.  Renal consulted for emergent dialysis. CMP otherwise remarkable for transaminitis with LFTs in 2000's. No etoh use, acetaminophen use. No obvious biliary or hepatic pathology.  His white count is normal.  He is not tachycardic.  Low concern for sepsis.  Discussed with medicine team.  Will admit for further management.  Handoff given.   Final Clinical Impression(s) / ED Diagnoses Final diagnoses:  Hyperkalemia  Hypervolemia, unspecified hypervolemia type    Rx / DC Orders ED Discharge  Orders     None        Idamae Lusher, MD 05/05/21 2305    Hayden Rasmussen, MD 05/06/21 1043

## 2021-05-05 NOTE — ED Triage Notes (Signed)
Pt c/o fall last night, reports back pain and dizziness today. Pt unsure if he was dizzy when he fell last night. Denies LOC. Dialysis T/TR/Sat.

## 2021-05-05 NOTE — ED Notes (Signed)
PT to CT.

## 2021-05-05 NOTE — ED Notes (Signed)
Lab reports glu 43, pt brought back to triage for cbg check, 38, IV started amp of D50 given, along with PO juice. Pt A&Ox4, states he is unsure when he last had dialysis and has been unable to keep food down in the last few days. Triage PA and charge RN made aware.

## 2021-05-05 NOTE — Consult Note (Signed)
[facility] Renal Consultation Note    Indication for Consultation:  Management of ESRD/hemodialysis; anemia, hypertension/volume and secondary hyperparathyroidism  HPI: Albert Harris is a 65 y.o. male with a PMH significant for COPD, HTN, HLD, PVD, PFO, h/o CVA, carotid stenosis, GERD, and ESRD on HD TTS at [facility] who presented to [facility] ED with complaints of back pain and dizziness.  Per the patient he stood up from his bed and fell and hit his right flank.  He admits to feeling weak and dizzy for the past 2 days as well as having some shortness of breath.  He did not go to dialysis on 11/21 and signed off an hour and a half early on 11/23 and did not go to HD on 11/26.  He has a history of medical noncompliance.  In the ED, he was noted to be hypertensive at 186/100, tachypneic at 26 SpO2 98% on room air.  Labs were notable for Na 139, K 7, Co2 17, BUN 103, Cr 16.09, Ca 10.1, alb 3.6, AST 2428, ALT 2294, Tbili 2.1, BNP >4500, WBC 13, Hgb 12, platelets 102.  We were consulted to provide urgent dialysis given his electrolyte abnormalities.  He also admitted to some N/V today but denied any chest pain/pressure, LOC, or trauma to his head.  His main complaint is right flank pain from his fall.  Past Medical History:  Diagnosis Date   Anemia    Asthma    Carotid stenosis 09/12/2019   Chronic kidney disease    t, th. sat dialysis   Claudication in peripheral vascular disease ([place]) 0/07/5425   Complication of anesthesia    " i HAVE A HARD TIME WAKING UP " (only one time)   COPD (chronic obstructive pulmonary disease) ([place])    COVID-19    positive on  06/16/20   GERD (gastroesophageal reflux disease)    HLD (hyperlipidemia)    Hypertension    Peripheral vascular disease (HCC)    PFO with atrial septal aneurysm    by echo 11/2017   Pneumonia    Stroke [facility])    [name] (transient ischemic attack) 11/2017   Tobacco abuse    quit 07/17/19   Past Surgical History:   Procedure Laterality Date   ABDOMINAL AORTOGRAM W/LOWER EXTREMITY N/A 06/21/2020   Procedure: ABDOMINAL AORTOGRAM W/LOWER EXTREMITY;  Surgeon: Angelia Mould, MD;  Location: [place] [facility];  Service: Cardiovascular;  Laterality: N/A;   AV FISTULA PLACEMENT Left 03/28/2020   Procedure: ARTERIOVENOUS (AV) FISTULA CREATION LEFT;  Surgeon: [name], MD;  Location: [place];  Service: Vascular;  Laterality: Left;   AV FISTULA PLACEMENT Right 09/23/2020   Procedure: RIGHT Arm ARTERIOVENOUS GRAFT CREATION.;  Surgeon: Angelia Mould, MD;  Location: [place];  Service: Vascular;  Laterality: Right;   COLONOSCOPY     COLONOSCOPY WITH PROPOFOL N/A 06/27/2020   Procedure: COLONOSCOPY WITH PROPOFOL;  Surgeon: Carol Ada, MD;  Location: [place];  Service: Endoscopy;  Laterality: N/A;   FINGER SURGERY     HEMOSTASIS CLIP PLACEMENT  06/27/2020   Procedure: HEMOSTASIS CLIP PLACEMENT;  Surgeon: Carol Ada, MD;  Location: [place];  Service: Endoscopy;;   INSERTION OF DIALYSIS CATHETER Right 06/24/2020   Procedure: INSERTION OF TUNNELED  DIALYSIS CATHETER; Removal of temporary dialysis catheter right neck;  Surgeon: Angelia Mould, MD;  Location: [facility] OR;  Service: Vascular;  Laterality: Right;   LIGATION OF ARTERIOVENOUS  FISTULA Left 07/24/2020   Procedure: LIGATION  OF LEFT BRACHIOCEPHALIC ARTERIOVENOUS  FISTULA;  Surgeon: [name], MD;  Location: [place];  Service: Vascular;  Laterality: Left;   PERIPHERAL VASCULAR INTERVENTION Right 06/21/2020   Procedure: PERIPHERAL VASCULAR INTERVENTION;  Surgeon: Angelia Mould, MD;  Location: [place] [facility];  Service: Cardiovascular;  Laterality: Right;   Family History:   Family History  Problem Relation Age of Onset   Hypertension Mother    Colon cancer Father    Stomach cancer Brother    Social History:  reports that he quit smoking about 21 months ago. His smoking use included cigarettes. He has a  50.00 pack-year smoking history. He has never used smokeless tobacco. He reports that he does not currently use alcohol after a past usage of about 1.0 - 3.0 standard drink per week. He reports that he does not use drugs. No Known Allergies Prior to Admission medications   Medication Sig Start Date End Date Taking? Authorizing Provider  albuterol (VENTOLIN HFA) 108 (90 Base) MCG/ACT inhaler INHALE 1-2 PUFFS INTO THE LUNGS EVERY 6 (SIX) HOURS AS NEEDED FOR WHEEZING OR SHORTNESS OF BREATH. 07/02/20 07/02/21  Maness, Arnette Norris, MD  amLODipine (NORVASC) 10 MG tablet Take 1 tablet (10 mg total) by mouth in the morning. 11/27/20   Kerin Perna, NP  aspirin 81 MG EC tablet TAKE 1 TABLET (81 MG TOTAL) BY MOUTH DAILY. SWALLOW WHOLE. 07/02/20 07/02/21  Maness, Arnette Norris, MD  calcitRIOL (ROCALTROL) 0.5 MCG capsule Take 1 capsule by mouth every morning as directed 12/28/20     calcium acetate (PHOSLO) 667 MG capsule TAKE 2 CAPSULES BY MOUTH THREE TIMES DAILY WITH MEALS 08/13/20 08/13/21  Corliss Parish, MD  calcium acetate (PHOSLO) 667 MG capsule Take 2 capsule by mouth three times a day with meals 01/02/21     furosemide (LASIX) 40 MG tablet TAKE 1 TABLET BY MOUTH TWICE A DAY, AT 8AM AND 2PM 04/23/20 04/23/21  Elmarie Shiley, MD  hydrALAZINE (APRESOLINE) 50 MG tablet Take 1 tablet (50 mg total) by mouth every 8 (eight) hours. 12/19/20 01/18/21  Arrien, Jimmy Picket, MD  hydrOXYzine (ATARAX/VISTARIL) 50 MG tablet Take 1 tablet by mouth four times a day as needed for itching on dialysis days 11/19/20     lidocaine-prilocaine (EMLA) cream Apply 1 application topically See admin instructions. Apply a small amount to access site 1-2 hours before dialysis,cover with occlusive dressing(saran wrap) 11/21/20   [provider]  losartan (COZAAR) 50 MG tablet Take 1 tablet by mouth every evening 01/16/21     metoprolol tartrate (LOPRESSOR) 25 MG tablet TAKE 1 TABLET BY MOUTH TWICE A DAY 07/25/20 07/25/21  Corliss Parish, MD  nitroGLYCERIN (NITROSTAT) 0.4 MG SL tablet Place 1 tablet (0.4 mg total) under the tongue every 5 (five) minutes x 3 doses as needed for chest pain. 07/23/19   Aline August, MD  ondansetron (ZOFRAN ODT) 4 MG disintegrating tablet Take 1 tablet (4 mg total) by mouth every 8 (eight) hours as needed for nausea or vomiting. 11/27/20   Kerin Perna, NP  oxyCODONE-acetaminophen (PERCOCET) 7.5-325 MG tablet Take 1 tablet by mouth every 4 (four) hours as needed for severe pain. 09/23/20 09/23/21  Setzer, Edman Circle, PA-C  sodium bicarbonate 650 MG tablet Take 650 mg by mouth daily as needed for heartburn. 02/29/20   [provider]   Current Facility-Administered Medications  Medication Dose Route Frequency Provider Last Rate Last Admin   [START ON 05/06/2021] Chlorhexidine Gluconate Cloth 2 % PADS 6 each  6 each Topical Q0600 Donato Heinz, MD       Current Outpatient Medications  Medication Sig Dispense Refill   albuterol (VENTOLIN HFA) 108 (90 Base) MCG/ACT inhaler INHALE 1-2 PUFFS INTO THE LUNGS EVERY 6 (SIX) HOURS AS NEEDED FOR WHEEZING OR SHORTNESS OF BREATH. 8 g 0   amLODipine (NORVASC) 10 MG tablet Take 1 tablet (10 mg total) by mouth in the morning. 90 tablet 1   aspirin 81 MG EC tablet TAKE 1 TABLET (81 MG TOTAL) BY MOUTH DAILY. SWALLOW WHOLE. 30 tablet 11   calcitRIOL (ROCALTROL) 0.5 MCG capsule Take 1 capsule by mouth every morning as directed 30 capsule 5   calcium acetate (PHOSLO) 667 MG capsule TAKE 2 CAPSULES BY MOUTH THREE TIMES DAILY WITH MEALS 180 capsule 11   calcium acetate (PHOSLO) 667 MG capsule Take 2 capsule by mouth three times a day with meals 180 capsule 6   furosemide (LASIX) 40 MG tablet TAKE 1 TABLET BY MOUTH TWICE A DAY, AT 8AM AND 2PM 180 tablet 3   hydrALAZINE (APRESOLINE) 50 MG tablet Take 1 tablet (50 mg total) by mouth every 8 (eight) hours. 90 tablet 0   hydrOXYzine (ATARAX/VISTARIL) 50 MG tablet Take 1 tablet by mouth four times a day as  needed for itching on dialysis days 120 tablet 6   lidocaine-prilocaine (EMLA) cream Apply 1 application topically See admin instructions. Apply a small amount to access site 1-2 hours before dialysis,cover with occlusive dressing(saran wrap)     losartan (COZAAR) 50 MG tablet Take 1 tablet by mouth every evening 30 tablet 11   metoprolol tartrate (LOPRESSOR) 25 MG tablet TAKE 1 TABLET BY MOUTH TWICE A DAY 60 tablet 6   nitroGLYCERIN (NITROSTAT) 0.4 MG SL tablet Place 1 tablet (0.4 mg total) under the tongue every 5 (five) minutes x 3 doses as needed for chest pain. 30 tablet 0   ondansetron (ZOFRAN ODT) 4 MG disintegrating tablet Take 1 tablet (4 mg total) by mouth every 8 (eight) hours as needed for nausea or vomiting. 20 tablet 0   oxyCODONE-acetaminophen (PERCOCET) 7.5-325 MG tablet Take 1 tablet by mouth every 4 (four) hours as needed for severe pain. 20 tablet 0   sodium bicarbonate 650 MG tablet Take 650 mg by mouth daily as needed for heartburn.     Labs: Basic Metabolic Panel: Recent Labs  Lab 05/05/21 1534  NA 139  K 7.0*  CL 95*  CO2 17*  GLUCOSE 43*  BUN 103*  CREATININE 16.09*  CALCIUM 10.1   Liver Function Tests: Recent Labs  Lab 05/05/21 1534  AST 2,428*  ALT 2,294*  ALKPHOS 109  BILITOT 2.1*  PROT 6.6  ALBUMIN 3.6   No results for input(s): LIPASE, AMYLASE in the last 168 hours. No results for input(s): AMMONIA in the last 168 hours. CBC: Recent Labs  Lab 05/05/21 1534  WBC 13.0*  NEUTROABS 10.5*  HGB 12.0*  HCT 38.5*  MCV 101.3*  PLT 102*   Cardiac Enzymes: No results for input(s): CKTOTAL, CKMB, CKMBINDEX, TROPONINI in the last 168 hours. CBG: Recent Labs  Lab 05/05/21 1719 05/05/21 1752  GLUCAP 38* 67*   Iron Studies: No results for input(s): IRON, TIBC, TRANSFERRIN, FERRITIN in the last 72 hours. Studies/Results: DG Chest 2 View  Result Date: 05/05/2021 CLINICAL DATA:  Weakness, fell last night EXAM: CHEST - 2 VIEW COMPARISON:   12/14/2020 FINDINGS: Enlargement of cardiac silhouette. Atherosclerotic calcification aorta. Slightly prominent pulmonary vascular structures at the hila unchanged. Lungs  clear. No pulmonary infiltrate, pleural effusion, or pneumothorax. Diffuse osseous demineralization. No definite fractures identified. IMPRESSION: Enlargement of cardiac silhouette. No acute abnormalities. Electronically Signed   By: Lavonia Dana M.D.   On: 05/05/2021 16:34   DG Thoracic Spine 2 View  Result Date: 05/05/2021 CLINICAL DATA:  Back pain post fall last night EXAM: THORACIC SPINE 2 VIEWS COMPARISON:  None FINDINGS: Vertebral body and disc space heights maintained. No fracture, subluxation, or bone destruction. Atherosclerotic calcifications aorta. IMPRESSION: No acute osseous abnormalities. Aortic Atherosclerosis (ICD10-I70.0). Electronically Signed   By: Lavonia Dana M.D.   On: 05/05/2021 16:35   CT Head Wo Contrast  Result Date: 05/05/2021 CLINICAL DATA:  Trauma. EXAM: CT HEAD WITHOUT CONTRAST CT CERVICAL SPINE WITHOUT CONTRAST TECHNIQUE: Multidetector CT imaging of the head and cervical spine was performed following the standard protocol without intravenous contrast. Multiplanar CT image reconstructions of the cervical spine were also generated. COMPARISON:  None. FINDINGS: CT HEAD FINDINGS Brain: The ventricles and sulci appropriate size for patient's age. 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 There is no acute intracranial hemorrhage. No mass effect or midline shift no extra-axial fluid collection. Vascular: No hyperdense vessel or unexpected calcification. Skull: Normal. Negative for fracture or focal lesion. Sinuses/Orbits: No acute finding. Other: There is air within the superior sagittal sinus, likely iatrogenic and introduced via IV access. CT CERVICAL SPINE FINDINGS Alignment: No acute subluxation. There is straightening of normal cervical lordosis  which may be positional or due to muscle spasm. Skull base and vertebrae: No acute fracture. Soft tissues and spinal canal: No prevertebral fluid or swelling. No visible canal hematoma. Disc levels: Multilevel degenerative changes with disc space narrowing and endplate irregularity and spurring. Upper chest: Negative. Other: Bilateral carotid bulb calcified plaques. IMPRESSION: 1. No acute intracranial pathology. 2. Moderate air within the superior sagittal sinus, likely iatrogenic and introduced via IV access. 3. No acute/traumatic cervical spine pathology. Multilevel degenerative changes. These results were called by telephone at the time of interpretation on 05/05/2021 at 6:51 pm to Dr. Aletta Edouard, who verbally acknowledged these results. Electronically Signed   By: Anner Crete M.D.   On: 05/05/2021 18:53   CT Cervical Spine Wo Contrast  Result Date: 05/05/2021 CLINICAL DATA:  Trauma. EXAM: CT HEAD WITHOUT CONTRAST CT CERVICAL SPINE WITHOUT CONTRAST TECHNIQUE: Multidetector CT imaging of the head and cervical spine was performed following the standard protocol without intravenous contrast. Multiplanar CT image reconstructions of the cervical spine were also generated. COMPARISON:  None. FINDINGS: CT HEAD FINDINGS Brain: The ventricles and sulci appropriate size for patient's age. 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 There is no acute intracranial hemorrhage. No mass effect or midline shift no extra-axial fluid collection. Vascular: No hyperdense vessel or unexpected calcification. Skull: Normal. Negative for fracture or focal lesion. Sinuses/Orbits: No acute finding. Other: There is air within the superior sagittal sinus, likely iatrogenic and introduced via IV access. CT CERVICAL SPINE FINDINGS Alignment: No acute subluxation. There is straightening of normal cervical lordosis which may be positional or due to muscle spasm. Skull  base and vertebrae: No acute fracture. Soft tissues and spinal canal: No prevertebral fluid or swelling. No visible canal hematoma. Disc levels: Multilevel degenerative changes with disc space narrowing and endplate irregularity and spurring. Upper chest: Negative. Other: Bilateral carotid bulb calcified plaques. IMPRESSION: 1. No acute intracranial pathology. 2. Moderate  air within the superior sagittal sinus, likely iatrogenic and introduced via IV access. 3. No acute/traumatic cervical spine pathology. Multilevel degenerative changes. These results were called by telephone at the time of interpretation on 05/05/2021 at 6:51 pm to Dr. Aletta Edouard, who verbally acknowledged these results. Electronically Signed   By: Anner Crete M.D.   On: 05/05/2021 18:53    ROS: Pertinent items are noted in HPI. Physical Exam: Vitals:   05/05/21 1513 05/05/21 1900  BP: (!) 186/100 (!) 211/98  Pulse:  71  Resp:  (!) 26  Temp: 97.8 F (36.6 C)   TempSrc: Oral   SpO2: 98% 100%      Weight change:  No intake or output data in the 24 hours ending 05/05/21 2031 BP (!) 211/98   Pulse 71   Temp 97.8 F (36.6 C) (Oral)   Resp (!) 26   SpO2 100%  General appearance: cooperative, fatigued, and no distress Head: Normocephalic, without obvious abnormality, atraumatic Back: + right flank pain to palpation Resp: clear to auscultation bilaterally Cardio: regular rate and rhythm, S1, S2 normal, no murmur, click, rub or gallop GI: soft, non-tender; bowel sounds normal; no masses,  no organomegaly Extremities: extremities normal, atraumatic, no cyanosis or edema and right forearm AVG +T/B Dialysis Access:  Dialysis Orders: Center: [facility]  on TTS . EDW 70kg HD Bath 2K/2Ca  Time 4 hours Heparin none. Access R forearm AVG BFR 350 DFR 800    Micera 100 mcg every 2 weeks  Assessment/Plan:  Hyperkalemia - due to noncompliance with dialysis.  Given IV Ca, insulin/D50, and bicarb.  Will plan for urgent HD with  low K bath and follow.  ESRD -  as above, missed 2 sessions of HD and shortened a 3rd over the last week.  Hypertension/volume  - UF as BP tolerates.  Anemia  - stable  Metabolic bone disease -  continue calcium acetate with meals, hold vit D for now given elevated Ca and phos.   Nutrition -  renal diet Right flank pain - s/p fall.  Imaging per ED MD Elevated AST and ALT - unclear etiology.  For abdominal CT.  Follow LFT's.   Donetta Potts, MD [place] Pager [phone] 05/05/2021, 8:31 PM

## 2021-05-05 NOTE — H&P (Signed)
History and Physical    ABHAY GODBOLT EPP:[id number] DOB: [birth year] DOA: 05/05/2021  PCP: Kerin Perna, NP  Patient coming from: Home  I have personally briefly reviewed patient's old medical records in [place]  Chief Complaint: Fall at home  HPI: Albert Harris is a 65 y.o. male with medical history significant for ESRD on TTS HD, COPD, PVD, HTN, HLD, PFO who presented to the ED for evaluation after a fall at home.  Patient states he got out of the shower earlier today (11/20) his feet were wet and he was walking into his bedroom which has hardwood floors.  He slipped and his back hit the edge of the nightstand causing him to have significant low back pain which is why he came to the ED.  He did not hit his head or lose consciousness.  He denied any associated lightheadedness, dizziness, chest pain, palpitations, dyspnea, cough, dysuria.  He does report recent loose stools, nausea with vomiting, and low appetite.  He has not been eating well the last few days.  Per nephrology, patient has been nonadherent to dialysis.  He did not go on 11/21, signed off hour and a half early on 11/23, and did not go on 11/26.  ED Course:  Initial vitals showed BP 186/100, pulse 75, RR 20, temp 97.8 F, SPO2 98% on room air.  Labs show potassium 7.0, serum glucose 43, bicarb 17, BUN 103, creatinine 16.09, AST 2428, ALT 2294, alk phos 109, total bilirubin 2.1, sodium 139, WBC 13.0, hemoglobin 12.0, platelets 102,000, BNP >4500.  Respiratory panel in process.  2 view chest x-ray shows enlarged cardiac silhouette without focal consolidation, edema, effusion.  Thoracic spine x-ray negative for acute osseous abnormalities.  CT head without contrast is negative for acute intracranial pathology.  Moderate air within the superior sagittal sinus, likely iatrogenic and introduce via IV access noted.  CT of cervical spine without contrast is negative for acute/traumatic cervical spine  pathology.  Multilevel degenerative changes noted.  CT abdomen/pelvis with contrast negative for evidence of abdominal trauma.  Atrophic kidneys and small volume intraperitoneal free fluid noted.  Left colon diverticulosis seen.  Patient was given IV calcium gluconate 1 g, 5 units insulin with D50, 1 amp sodium bicarb.  Nephrology were consulted and plan for urgent hemodialysis.  Patient received additional amp D50 for hypoglycemia.  The hospitalist service was consulted to admit for further evaluation and management.  Review of Systems: All systems reviewed and are negative except as documented in history of present illness above.   Past Medical History:  Diagnosis Date   Anemia    Asthma    Carotid stenosis 09/12/2019   Chronic kidney disease    t, th. sat dialysis   Claudication in peripheral vascular disease ([place]) 6/0/6301   Complication of anesthesia    " i HAVE A HARD TIME WAKING UP " (only one time)   COPD (chronic obstructive pulmonary disease) ([place])    COVID-19    positive on  06/16/20   GERD (gastroesophageal reflux disease)    HLD (hyperlipidemia)    Hypertension    Peripheral vascular disease (HCC)    PFO with atrial septal aneurysm    by echo 11/2017   Pneumonia    Stroke [facility])    [name] (transient ischemic attack) 11/2017   Tobacco abuse    quit 07/17/19    Past Surgical History:  Procedure Laterality Date   ABDOMINAL AORTOGRAM W/LOWER EXTREMITY N/A 06/21/2020   Procedure:  ABDOMINAL AORTOGRAM W/LOWER EXTREMITY;  Surgeon: Angelia Mould, MD;  Location: [place] [facility];  Service: Cardiovascular;  Laterality: N/A;   AV FISTULA PLACEMENT Left 03/28/2020   Procedure: ARTERIOVENOUS (AV) FISTULA CREATION LEFT;  Surgeon: [name], MD;  Location: [place];  Service: Vascular;  Laterality: Left;   AV FISTULA PLACEMENT Right 09/23/2020   Procedure: RIGHT Arm ARTERIOVENOUS GRAFT CREATION.;  Surgeon: Angelia Mould, MD;  Location: [place];  Service: Vascular;   Laterality: Right;   COLONOSCOPY     COLONOSCOPY WITH PROPOFOL N/A 06/27/2020   Procedure: COLONOSCOPY WITH PROPOFOL;  Surgeon: Carol Ada, MD;  Location: [place];  Service: Endoscopy;  Laterality: N/A;   FINGER SURGERY     HEMOSTASIS CLIP PLACEMENT  06/27/2020   Procedure: HEMOSTASIS CLIP PLACEMENT;  Surgeon: Carol Ada, MD;  Location: [place];  Service: Endoscopy;;   INSERTION OF DIALYSIS CATHETER Right 06/24/2020   Procedure: INSERTION OF TUNNELED  DIALYSIS CATHETER; Removal of temporary dialysis catheter right neck;  Surgeon: Angelia Mould, MD;  Location: [place];  Service: Vascular;  Laterality: Right;   LIGATION OF ARTERIOVENOUS  FISTULA Left 07/24/2020   Procedure: LIGATION OF LEFT BRACHIOCEPHALIC ARTERIOVENOUS  FISTULA;  Surgeon: [name], MD;  Location: [place];  Service: Vascular;  Laterality: Left;   PERIPHERAL VASCULAR INTERVENTION Right 06/21/2020   Procedure: PERIPHERAL VASCULAR INTERVENTION;  Surgeon: Angelia Mould, MD;  Location: [place] [facility];  Service: Cardiovascular;  Laterality: Right;    Social History:  reports that he quit smoking about 21 months ago. His smoking use included cigarettes. He has a 50.00 pack-year smoking history. He has never used smokeless tobacco. He reports that he does not currently use alcohol after a past usage of about 1.0 - 3.0 standard drink per week. He reports that he does not use drugs.  No Known Allergies  Family History  Problem Relation Age of Onset   Hypertension Mother    Colon cancer Father    Stomach cancer Brother      Prior to Admission medications   Medication Sig Start Date End Date Taking? Authorizing Provider  albuterol (VENTOLIN HFA) 108 (90 Base) MCG/ACT inhaler INHALE 1-2 PUFFS INTO THE LUNGS EVERY 6 (SIX) HOURS AS NEEDED FOR WHEEZING OR SHORTNESS OF BREATH. 07/02/20 07/02/21 Yes Maness, Arnette Norris, MD  amLODipine (NORVASC) 10 MG tablet Take 1 tablet (10 mg total) by mouth in the  morning. 11/27/20  Yes Kerin Perna, NP  B Complex-C-Zn-Folic Acid (DIALYVITE 449 WITH ZINC) 0.8 MG TABS Take 1 tablet by mouth at bedtime. 04/03/21  Yes [provider]  calcium acetate (PHOSLO) 667 MG capsule Take 2 capsule by mouth three times a day with meals 01/02/21  Yes   lidocaine-prilocaine (EMLA) cream Apply 1 application topically See admin instructions. Apply a small amount to access site 1-2 hours before dialysis,cover with occlusive dressing(saran wrap) 11/21/20  Yes [provider]  metoprolol tartrate (LOPRESSOR) 25 MG tablet TAKE 1 TABLET BY MOUTH TWICE A DAY 07/25/20 07/25/21 Yes Corliss Parish, MD  nitroGLYCERIN (NITROSTAT) 0.4 MG SL tablet Place 1 tablet (0.4 mg total) under the tongue every 5 (five) minutes x 3 doses as needed for chest pain. 07/23/19  Yes Aline August, MD  sodium bicarbonate 650 MG tablet Take 650 mg by mouth daily as needed for heartburn. 02/29/20  Yes [provider]  aspirin 81 MG EC tablet TAKE 1 TABLET (81 MG TOTAL) BY MOUTH DAILY. SWALLOW WHOLE. Patient not taking:  Reported on 05/05/2021 07/02/20 07/02/21  Delora Fuel, MD  calcitRIOL (ROCALTROL) 0.5 MCG capsule Take 1 capsule by mouth every morning as directed Patient not taking: Reported on 05/05/2021 12/28/20     calcium acetate (PHOSLO) 667 MG capsule TAKE 2 CAPSULES BY MOUTH THREE TIMES DAILY WITH MEALS Patient not taking: Reported on 05/05/2021 08/13/20 08/13/21  Corliss Parish, MD  furosemide (LASIX) 40 MG tablet TAKE 1 TABLET BY MOUTH TWICE A DAY, AT 8AM AND 2PM 04/23/20 04/23/21  Elmarie Shiley, MD  hydrALAZINE (APRESOLINE) 50 MG tablet Take 1 tablet (50 mg total) by mouth every 8 (eight) hours. 12/19/20 01/18/21  Arrien, Jimmy Picket, MD  hydrOXYzine (ATARAX/VISTARIL) 50 MG tablet Take 1 tablet by mouth four times a day as needed for itching on dialysis days Patient not taking: Reported on 05/05/2021 11/19/20     losartan (COZAAR) 50 MG tablet Take 1 tablet by  mouth every evening Patient not taking: Reported on 05/05/2021 01/16/21     ondansetron (ZOFRAN ODT) 4 MG disintegrating tablet Take 1 tablet (4 mg total) by mouth every 8 (eight) hours as needed for nausea or vomiting. Patient not taking: Reported on 05/05/2021 11/27/20   Kerin Perna, NP  oxyCODONE-acetaminophen (PERCOCET) 7.5-325 MG tablet Take 1 tablet by mouth every 4 (four) hours as needed for severe pain. Patient not taking: Reported on 05/05/2021 09/23/20 09/23/21  Barbie Banner, PA-C    Physical Exam: Vitals:   05/05/21 1513 05/05/21 1900 05/05/21 2045 05/05/21 2100  BP: (!) 186/100 (!) 211/98 (!) 195/113 (!) 190/115  Pulse:  71 77 74  Resp:  (!) 26 (!) 24 (!) 22  Temp: 97.8 F (36.6 C)     TempSrc: Oral     SpO2: 98% 100% 98% 95%   Constitutional: Resting supine in bed, NAD, calm, comfortable Eyes: PERRL, lids and conjunctivae normal ENMT: Mucous membranes are dry. Posterior pharynx clear of any exudate or lesions.Normal dentition.  Neck: normal, supple, no masses. Respiratory: clear to auscultation bilaterally, no wheezing, no crackles. Normal respiratory effort. No accessory muscle use.  Cardiovascular: Regular rate and rhythm, no murmurs / rubs / gallops. No extremity edema. 2+ pedal pulses.  RUE aVF with palpable thrill. Abdomen: RUQ tenderness, no masses palpated. No hepatosplenomegaly. Bowel sounds positive.  Musculoskeletal: no clubbing / cyanosis. No joint deformity upper and lower extremities. Good ROM, no contractures. Normal muscle tone.  Skin: no rashes, lesions, ulcers. No induration Neurologic: CN 2-12 grossly intact. Sensation intact. Strength 5/5 in all 4.  Psychiatric: Normal judgment and insight. Alert and oriented x 3. Normal mood.   Labs on Admission: I have personally reviewed following labs and imaging studies  CBC: Recent Labs  Lab 05/05/21 1534  WBC 13.0*  NEUTROABS 10.5*  HGB 12.0*  HCT 38.5*  MCV 101.3*  PLT 774*   Basic Metabolic  Panel: Recent Labs  Lab 05/05/21 1534  NA 139  K 7.0*  CL 95*  CO2 17*  GLUCOSE 43*  BUN 103*  CREATININE 16.09*  CALCIUM 10.1   GFR: CrCl cannot be calculated (Unknown ideal weight.). Liver Function Tests: Recent Labs  Lab 05/05/21 1534  AST 2,428*  ALT 2,294*  ALKPHOS 109  BILITOT 2.1*  PROT 6.6  ALBUMIN 3.6   No results for input(s): LIPASE, AMYLASE in the last 168 hours. No results for input(s): AMMONIA in the last 168 hours. Coagulation Profile: No results for input(s): INR, PROTIME in the last 168 hours. Cardiac Enzymes: No results for input(s): CKTOTAL, CKMB, CKMBINDEX,  TROPONINI in the last 168 hours. BNP (last 3 results) No results for input(s): PROBNP in the last 8760 hours. HbA1C: No results for input(s): HGBA1C in the last 72 hours. CBG: Recent Labs  Lab 05/05/21 1719 05/05/21 1752 05/05/21 2054  GLUCAP 38* 67* 152*   Lipid Profile: No results for input(s): CHOL, HDL, LDLCALC, TRIG, CHOLHDL, LDLDIRECT in the last 72 hours. Thyroid Function Tests: No results for input(s): TSH, T4TOTAL, FREET4, T3FREE, THYROIDAB in the last 72 hours. Anemia Panel: No results for input(s): VITAMINB12, FOLATE, FERRITIN, TIBC, IRON, RETICCTPCT in the last 72 hours. Urine analysis:    Component Value Date/Time   COLORURINE YELLOW 12/05/2020 1339   APPEARANCEUR CLEAR 12/05/2020 1339   LABSPEC 1.016 12/05/2020 1339   PHURINE 7.0 12/05/2020 1339   GLUCOSEU 150 (A) 12/05/2020 1339   HGBUR NEGATIVE 12/05/2020 1339   BILIRUBINUR NEGATIVE 12/05/2020 1339   KETONESUR 5 (A) 12/05/2020 1339   PROTEINUR >=300 (A) 12/05/2020 1339   UROBILINOGEN 1.0 03/19/2014 1051   NITRITE NEGATIVE 12/05/2020 1339   LEUKOCYTESUR NEGATIVE 12/05/2020 1339    Radiological Exams on Admission: DG Chest 2 View  Result Date: 05/05/2021 CLINICAL DATA:  Weakness, fell last night EXAM: CHEST - 2 VIEW COMPARISON:  12/14/2020 FINDINGS: Enlargement of cardiac silhouette. Atherosclerotic  calcification aorta. Slightly prominent pulmonary vascular structures at the hila unchanged. Lungs clear. No pulmonary infiltrate, pleural effusion, or pneumothorax. Diffuse osseous demineralization. No definite fractures identified. IMPRESSION: Enlargement of cardiac silhouette. No acute abnormalities. Electronically Signed   By: Lavonia Dana M.D.   On: 05/05/2021 16:34   DG Thoracic Spine 2 View  Result Date: 05/05/2021 CLINICAL DATA:  Back pain post fall last night EXAM: THORACIC SPINE 2 VIEWS COMPARISON:  None FINDINGS: Vertebral body and disc space heights maintained. No fracture, subluxation, or bone destruction. Atherosclerotic calcifications aorta. IMPRESSION: No acute osseous abnormalities. Aortic Atherosclerosis (ICD10-I70.0). Electronically Signed   By: Lavonia Dana M.D.   On: 05/05/2021 16:35   CT Head Wo Contrast  Result Date: 05/05/2021 CLINICAL DATA:  Trauma. EXAM: CT HEAD WITHOUT CONTRAST CT CERVICAL SPINE WITHOUT CONTRAST TECHNIQUE: Multidetector CT imaging of the head and cervical spine was performed following the standard protocol without intravenous contrast. Multiplanar CT image reconstructions of the cervical spine were also generated. COMPARISON:  None. FINDINGS: CT HEAD FINDINGS Brain: The ventricles and sulci appropriate size for patient's age. 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 There is no acute intracranial hemorrhage. No mass effect or midline shift no extra-axial fluid collection. Vascular: No hyperdense vessel or unexpected calcification. Skull: Normal. Negative for fracture or focal lesion. Sinuses/Orbits: No acute finding. Other: There is air within the superior sagittal sinus, likely iatrogenic and introduced via IV access. CT CERVICAL SPINE FINDINGS Alignment: No acute subluxation. There is straightening of normal cervical lordosis which may be positional or due to muscle spasm. Skull base and vertebrae:  No acute fracture. Soft tissues and spinal canal: No prevertebral fluid or swelling. No visible canal hematoma. Disc levels: Multilevel degenerative changes with disc space narrowing and endplate irregularity and spurring. Upper chest: Negative. Other: Bilateral carotid bulb calcified plaques. IMPRESSION: 1. No acute intracranial pathology. 2. Moderate air within the superior sagittal sinus, likely iatrogenic and introduced via IV access. 3. No acute/traumatic cervical spine pathology. Multilevel degenerative changes. These results were called by telephone at the time of interpretation on 05/05/2021 at 6:51 pm to Dr. Aletta Edouard, who verbally acknowledged these results.  Electronically Signed   By: Anner Crete M.D.   On: 05/05/2021 18:53   CT Cervical Spine Wo Contrast  Result Date: 05/05/2021 CLINICAL DATA:  Trauma. EXAM: CT HEAD WITHOUT CONTRAST CT CERVICAL SPINE WITHOUT CONTRAST TECHNIQUE: Multidetector CT imaging of the head and cervical spine was performed following the standard protocol without intravenous contrast. Multiplanar CT image reconstructions of the cervical spine were also generated. COMPARISON:  None. FINDINGS: CT HEAD FINDINGS Brain: The ventricles and sulci appropriate size for patient's age. 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 There is no acute intracranial hemorrhage. No mass effect or midline shift no extra-axial fluid collection. Vascular: No hyperdense vessel or unexpected calcification. Skull: Normal. Negative for fracture or focal lesion. Sinuses/Orbits: No acute finding. Other: There is air within the superior sagittal sinus, likely iatrogenic and introduced via IV access. CT CERVICAL SPINE FINDINGS Alignment: No acute subluxation. There is straightening of normal cervical lordosis which may be positional or due to muscle spasm. Skull base and vertebrae: No acute fracture. Soft tissues and spinal canal: No  prevertebral fluid or swelling. No visible canal hematoma. Disc levels: Multilevel degenerative changes with disc space narrowing and endplate irregularity and spurring. Upper chest: Negative. Other: Bilateral carotid bulb calcified plaques. IMPRESSION: 1. No acute intracranial pathology. 2. Moderate air within the superior sagittal sinus, likely iatrogenic and introduced via IV access. 3. No acute/traumatic cervical spine pathology. Multilevel degenerative changes. These results were called by telephone at the time of interpretation on 05/05/2021 at 6:51 pm to Dr. Aletta Edouard, who verbally acknowledged these results. Electronically Signed   By: Anner Crete M.D.   On: 05/05/2021 18:53   CT ABDOMEN PELVIS W CONTRAST  Result Date: 05/05/2021 CLINICAL DATA:  Abdominal trauma.  Fall EXAM: CT ABDOMEN AND PELVIS WITH CONTRAST TECHNIQUE: Multidetector CT imaging of the abdomen and pelvis was performed using the standard protocol following bolus administration of intravenous contrast. CONTRAST:  31mL OMNIPAQUE IOHEXOL 350 MG/ML SOLN COMPARISON:  None. FINDINGS: Lower chest: Lung bases are clear. Hepatobiliary: No focal hepatic lesion. No biliary duct dilatation. Common bile duct is normal. Pancreas: Pancreas is normal. No ductal dilatation. No pancreatic inflammation. Spleen: Normal spleen Adrenals/urinary tract: Adrenal glands normal. Kidneys are atrophic. Ureters and bladder normal. Stomach/Bowel: Stomach, small bowel, appendix, and cecum are normal. Multiple diverticula of the descending colon and sigmoid colon without acute inflammation. Vascular/Lymphatic: Abdominal aorta is normal caliber with atherosclerotic calcification. There is no retroperitoneal or periportal lymphadenopathy. No pelvic lymphadenopathy. Reproductive: Unremarkable Other: Small volume of free fluid along the margin of the liver. Musculoskeletal: No aggressive osseous lesion.  No fracture. IMPRESSION: 1. No evidence of abdominal trauma.  2. Atrophic kidneys and small volume intraperitoneal free fluid. 3. LEFT colon diverticulosis. 4. Dense atherosclerotic disease of the aorta. Aortic Atherosclerosis (ICD10-I70.0). Electronically Signed   By: Suzy Bouchard M.D.   On: 05/05/2021 21:55    EKG: Personally reviewed. Normal sinus rhythm, no peaked T wave changes, LAE, LAFB, QTC 500.  Not significantly changed when compared to prior.  Assessment/Plan Principal Problem:   Hyperkalemia Active Problems:   ESRD (end stage renal disease) (HCC)   Hypertension   COPD (chronic obstructive pulmonary disease) (HCC)   Elevated LFTs   Hypoglycemia   Thrombocytopenia (HCC)   Fall at home, initial encounter   CORDEL DREWES is a 65 y.o. male with medical history significant for ESRD on TTS HD, COPD, PVD, HTN, HLD, PFO who  is admitted with hyperkalemia, elevated LFTs, hyperglycemia in setting of nonadherence to hemodialysis.  Hyperkalemia: Secondary to nonadherence to HD.  S/p IV calcium gluconate, insulin/D50, sodium bicarb.  Nephrology plan for urgent hemodialysis.  Elevated LFTs: AST 2428 and ALT 2294.  CT A/P without any obvious etiology.  He does have RUQ tenderness on exam.  He states that he did take 2 Tylenol and ibuprofen prior to admission but does not regularly take these.  He denies any alcohol use. -Obtain RUQ ultrasound -Check serum ethanol, salicylate, acetaminophen levels -Acute hepatitis panel  Hypoglycemia: Present on ED arrival.  Likely secondary to poor oral intake.  Not on insulin or oral hypoglycemics as an outpatient.  Monitor CBGs and use D50 as needed.  ESRD on TTS HD: Urgent hemodialysis per nephrology.  Hypertension: Continue Lopressor, holding amlodipine in setting of liver dysfunction.  COPD: Stable without acute respiratory issue.  Continue albuterol as needed.  Thrombocytopenia: Mild without obvious bleeding.  Continue to monitor.  Low back pain after fall at home: Continue low back pain after  mechanical fall at home.  No evidence of acute traumatic injury on imaging.  Request PT/OT eval.  DVT prophylaxis: Subcutaneous heparin Code Status: Full code, confirmed with patient Family Communication: Discussed with patient, he has discussed with family Disposition Plan: From home and likely discharge to home pending clinical progress Consults called: Nephrology Level of care: Telemetry Medical Admission status:  Status is: Inpatient  Remains inpatient appropriate because: Multiple electrolyte/lab abnormalities including severe hyperkalemia, acute liver injury, hypoglycemia, requiring urgent hemodialysis with close monitoring of improvement and lab abnormalities and further work-up of acute liver injury.  Zada Finders MD [facility]  If 7PM-7AM, please contact night-coverage [email or web address]  05/05/2021, 11:42 PM

## 2021-05-05 NOTE — ED Notes (Signed)
Patient transported to CT 

## 2021-05-05 NOTE — ED Provider Notes (Signed)
Emergency Medicine Provider Triage Evaluation Note  Albert Harris , a 65 y.o. male  was evaluated in triage.  Pt complains of fall.  Happened last night, patient is unsure why or how he fell but he landed hitting his back.  Unsure if he hit his head or had a syncopal event.  Having pain in the upper back, not on any blood thinners.  Patient's been feeling dizziness intermittently, worse when he goes from a sitting to standing position.  Last dialysis was on Saturday.  Review of Systems  Positive: Fall, dizziness Negative: Chest pain  Physical Exam  BP (!) 186/100 (BP Location: Left Arm)   Temp 97.8 F (36.6 C) (Oral)   SpO2 98%  Gen:   Awake, no distress   Resp:  Normal effort  MSK:   Thoracic midline tenderness Other:  Cranial nerves II through XII are grossly intact, grip strength equal bilaterally.  No dysarthria, no pitting edema  Medical Decision Making  Medically screening exam initiated at 3:35 PM.  Appropriate orders placed.  Albert Harris was informed that the remainder of the evaluation will be completed by another provider, this initial triage assessment does not replace that evaluation, and the importance of remaining in the ED until their evaluation is complete.  Fall, unclear etiology stable calcified differential.  Symptoms seem consistent with orthostatic hypotension.   Sherrill Raring, PA-C 05/05/21 1537    Regan Lemming, MD 05/05/21 [phone]

## 2021-05-06 ENCOUNTER — Inpatient Hospital Stay (HOSPITAL_COMMUNITY): Payer: Medicare Other

## 2021-05-06 DIAGNOSIS — E875 Hyperkalemia: Secondary | ICD-10-CM | POA: Diagnosis not present

## 2021-05-06 LAB — COMPREHENSIVE METABOLIC PANEL
ALT: 2008 U/L — ABNORMAL HIGH (ref 0–44)
AST: 1777 U/L — ABNORMAL HIGH (ref 15–41)
Albumin: 3.2 g/dL — ABNORMAL LOW (ref 3.5–5.0)
Alkaline Phosphatase: 104 U/L (ref 38–126)
Anion gap: 23 — ABNORMAL HIGH (ref 5–15)
BUN: 107 mg/dL — ABNORMAL HIGH (ref 8–23)
CO2: 20 mmol/L — ABNORMAL LOW (ref 22–32)
Calcium: 9.4 mg/dL (ref 8.9–10.3)
Chloride: 95 mmol/L — ABNORMAL LOW (ref 98–111)
Creatinine, Ser: 16.33 mg/dL — ABNORMAL HIGH (ref 0.61–1.24)
GFR, Estimated: 3 mL/min — ABNORMAL LOW (ref >60)
Glucose, Bld: 100 mg/dL — ABNORMAL HIGH (ref 70–99)
Potassium: 5.9 mmol/L — ABNORMAL HIGH (ref 3.5–5.1)
Sodium: 138 mmol/L (ref 135–145)
Total Bilirubin: 2.1 mg/dL — ABNORMAL HIGH (ref 0.3–1.2)
Total Protein: 5.8 g/dL — ABNORMAL LOW (ref 6.5–8.1)

## 2021-05-06 LAB — RENAL FUNCTION PANEL
Albumin: 3.2 g/dL — ABNORMAL LOW (ref 3.5–5.0)
Anion gap: 22 — ABNORMAL HIGH (ref 5–15)
BUN: 107 mg/dL — ABNORMAL HIGH (ref 8–23)
CO2: 22 mmol/L (ref 22–32)
Calcium: 9.4 mg/dL (ref 8.9–10.3)
Chloride: 95 mmol/L — ABNORMAL LOW (ref 98–111)
Creatinine, Ser: 16.65 mg/dL — ABNORMAL HIGH (ref 0.61–1.24)
GFR, Estimated: 3 mL/min — ABNORMAL LOW (ref >60)
Glucose, Bld: 84 mg/dL (ref 70–99)
Phosphorus: 11.3 mg/dL — ABNORMAL HIGH (ref 2.5–4.6)
Potassium: 6 mmol/L — ABNORMAL HIGH (ref 3.5–5.1)
Sodium: 139 mmol/L (ref 135–145)

## 2021-05-06 LAB — HEPATITIS PANEL, ACUTE
HCV Ab: NONREACTIVE
Hep A IgM: NONREACTIVE
Hep B C IgM: NONREACTIVE
Hepatitis B Surface Ag: NONREACTIVE

## 2021-05-06 LAB — CBC
HCT: 34.1 % — ABNORMAL LOW (ref 39.0–52.0)
HCT: 34 % — ABNORMAL LOW (ref 39.0–52.0)
Hemoglobin: 10.7 g/dL — ABNORMAL LOW (ref 13.0–17.0)
Hemoglobin: 11 g/dL — ABNORMAL LOW (ref 13.0–17.0)
MCH: 31.5 pg (ref 26.0–34.0)
MCH: 31.7 pg (ref 26.0–34.0)
MCHC: 31.4 g/dL (ref 30.0–36.0)
MCHC: 32.4 g/dL (ref 30.0–36.0)
MCV: 100.3 fL — ABNORMAL HIGH (ref 80.0–100.0)
MCV: 98 fL (ref 80.0–100.0)
Platelets: 97 10*3/uL — ABNORMAL LOW (ref 150–400)
Platelets: 94 10*3/uL — ABNORMAL LOW (ref 150–400)
RBC: 3.4 MIL/uL — ABNORMAL LOW (ref 4.22–5.81)
RBC: 3.47 MIL/uL — ABNORMAL LOW (ref 4.22–5.81)
RDW: 15.9 % — ABNORMAL HIGH (ref 11.5–15.5)
RDW: 15.7 % — ABNORMAL HIGH (ref 11.5–15.5)
WBC: 11.7 10*3/uL — ABNORMAL HIGH (ref 4.0–10.5)
WBC: 11.8 10*3/uL — ABNORMAL HIGH (ref 4.0–10.5)
nRBC: 0.3 % — ABNORMAL HIGH (ref 0.0–0.2)
nRBC: 0.3 % — ABNORMAL HIGH (ref 0.0–0.2)

## 2021-05-06 LAB — GLUCOSE, CAPILLARY
Glucose-Capillary: 58 mg/dL — ABNORMAL LOW (ref 70–99)
Glucose-Capillary: 98 mg/dL (ref 70–99)
Glucose-Capillary: 93 mg/dL (ref 70–99)
Glucose-Capillary: 111 mg/dL — ABNORMAL HIGH (ref 70–99)

## 2021-05-06 LAB — PROTIME-INR
INR: 2.8 — ABNORMAL HIGH (ref 0.8–1.2)
Prothrombin Time: 29.7 seconds — ABNORMAL HIGH (ref 11.4–15.2)

## 2021-05-06 LAB — HEPATITIS B SURFACE ANTIBODY,QUALITATIVE: Hep B S Ab: REACTIVE — AB

## 2021-05-06 LAB — CBG MONITORING, ED
Glucose-Capillary: 108 mg/dL — ABNORMAL HIGH (ref 70–99)
Glucose-Capillary: 52 mg/dL — ABNORMAL LOW (ref 70–99)
Glucose-Capillary: 53 mg/dL — ABNORMAL LOW (ref 70–99)
Glucose-Capillary: 54 mg/dL — ABNORMAL LOW (ref 70–99)

## 2021-05-06 LAB — ETHANOL: Alcohol, Ethyl (B): <10 mg/dL (ref <10)

## 2021-05-06 LAB — SALICYLATE LEVEL: Salicylate Lvl: <7 mg/dL — ABNORMAL LOW (ref 7.0–30.0)

## 2021-05-06 LAB — ACETAMINOPHEN LEVEL: Acetaminophen (Tylenol), Serum: <10 ug/mL — ABNORMAL LOW (ref 10–30)

## 2021-05-06 MED ORDER — CALCIUM ACETATE (PHOS BINDER) 667 MG PO CAPS
1334.0000 mg | ORAL_CAPSULE | Freq: Three times a day (TID) | ORAL | Status: DC
  Administered 2021-05-06 (×3): 1334 mg via ORAL

## 2021-05-06 MED ORDER — DEXTROSE 50 % IV SOLN
INTRAVENOUS | Status: AC
  Filled 2021-05-06: qty 50

## 2021-05-06 MED ORDER — SODIUM CHLORIDE 0.9 % IV SOLN: 100.0000 mL | INTRAVENOUS | Status: DC | PRN

## 2021-05-06 MED ORDER — CALCIUM CARBONATE ANTACID 500 MG PO CHEW
1.0000 | CHEWABLE_TABLET | Freq: Three times a day (TID) | ORAL | Status: DC
  Administered 2021-05-07 – 2021-05-11 (×9): 200 mg via ORAL
  Filled 2021-05-06 (×10): qty 1

## 2021-05-06 MED ORDER — METOPROLOL TARTRATE 25 MG PO TABS
25.0000 mg | ORAL_TABLET | Freq: Two times a day (BID) | ORAL | Status: DC
  Administered 2021-05-06 – 2021-05-13 (×13): 25 mg via ORAL
  Filled 2021-05-06 (×12): qty 1

## 2021-05-06 MED ORDER — HEPARIN SODIUM (PORCINE) 5000 UNIT/ML IJ SOLN
INTRAMUSCULAR | Status: AC
  Filled 2021-05-06: qty 1

## 2021-05-06 MED ORDER — METOPROLOL TARTRATE 25 MG PO TABS
ORAL_TABLET | ORAL | Status: AC
  Filled 2021-05-06: qty 2

## 2021-05-06 MED ORDER — METOPROLOL TARTRATE 25 MG PO TABS
ORAL_TABLET | ORAL | Status: AC
  Filled 2021-05-06: qty 1

## 2021-05-06 MED ORDER — PENTAFLUOROPROP-TETRAFLUOROETH EX AERO: 1.0000 "application " | INHALATION_SPRAY | CUTANEOUS | Status: DC | PRN

## 2021-05-06 MED ORDER — HYDROMORPHONE HCL 1 MG/ML IJ SOLN
INTRAMUSCULAR | Status: AC
  Filled 2021-05-06: qty 1

## 2021-05-06 MED ORDER — LIDOCAINE HCL (PF) 1 % IJ SOLN
5.0000 mL | INTRAMUSCULAR | Status: DC | PRN
  Filled 2021-05-06: qty 5

## 2021-05-06 MED ORDER — OXYCODONE HCL 5 MG PO TABS
5.0000 mg | ORAL_TABLET | Freq: Four times a day (QID) | ORAL | Status: DC | PRN
  Administered 2021-05-07: 5 mg via ORAL
  Filled 2021-05-06: qty 1

## 2021-05-06 MED ORDER — LIDOCAINE-PRILOCAINE 2.5-2.5 % EX CREA: 1.0000 "application " | TOPICAL_CREAM | CUTANEOUS | Status: DC | PRN

## 2021-05-06 MED ORDER — ALTEPLASE 2 MG IJ SOLR: 2.0000 mg | Freq: Once | INTRAMUSCULAR | Status: DC | PRN

## 2021-05-06 MED ORDER — HEPARIN SODIUM (PORCINE) 1000 UNIT/ML DIALYSIS: 1000.0000 [IU] | INTRAMUSCULAR | Status: DC | PRN

## 2021-05-06 MED ORDER — CALCIUM ACETATE (PHOS BINDER) 667 MG PO CAPS
ORAL_CAPSULE | ORAL | Status: AC
  Filled 2021-05-06: qty 2

## 2021-05-06 MED ORDER — LIDOCAINE 5 % EX PTCH
1.0000 | MEDICATED_PATCH | CUTANEOUS | Status: DC
  Administered 2021-05-06 – 2021-05-12 (×7): 1 via TRANSDERMAL
  Filled 2021-05-06 (×6): qty 1

## 2021-05-06 NOTE — Progress Notes (Signed)
0930: Nursing spoke with patient's wife Bethena Roys to give her an update on patients status. Bethena Roys wanted [place] to know that patient has not been eating since Thanksgiving and that he has only been going to dialysis about once a month.

## 2021-05-06 NOTE — Evaluation (Signed)
Physical Therapy Evaluation Patient Details Name: Albert Harris MRN: [id number] DOB: 1955-10-21 Today's Date: 05/06/2021  History of Present Illness  Albert Harris is a 65 y.o. male admitted 11/28 after a fall at home. Per wife pt had not eaten since Thanksgiving and has missed several HD sessions over the last month. PMH: ESRD on TTS HD, COPD, PVD, HTN, HLD, PFO  Clinical Impression  Pt admitted with above diagnosis. Pt too fatigued to do more than sit EOB today. Pt reports he is just "tired".  Pt can move all 4 extremities. Will continue to follow acutely.  Pt currently with functional limitations due to the deficits listed below (see PT Problem List). Pt will benefit from skilled PT to increase their independence and safety with mobility to allow discharge to the venue listed below.          Recommendations for follow up therapy are one component of a multi-disciplinary discharge planning process, led by the attending physician.  Recommendations may be updated based on patient status, additional functional criteria and insurance authorization.  Follow Up Recommendations Home health PT    Assistance Recommended at Discharge PRN  Functional Status Assessment Patient has had a recent decline in their functional status and demonstrates the ability to make significant improvements in function in a reasonable and predictable amount of time.  Equipment Recommendations  None recommended by PT    Recommendations for Other Services       Precautions / Restrictions Precautions Precautions: Fall Restrictions Weight Bearing Restrictions: No      Mobility  Bed Mobility Overal bed mobility: Independent                  Transfers Overall transfer level:  (declined to stand as pt states he is tired and just wants to rest in bed today.)                      Ambulation/Gait                  Stairs            Wheelchair Mobility    Modified Rankin (Stroke  Patients Only)       Balance                                             Pertinent Vitals/Pain Pain Assessment: No/denies pain    Home Living Family/patient expects to be discharged to:: Private residence Living Arrangements: Spouse/significant other Available Help at Discharge: Family;Available 24 hours/day Type of Home: House Home Access: Stairs to enter Entrance Stairs-Rails: Right Entrance Stairs-Number of Steps: 3   Home Layout: One level Home Equipment: Conservation officer, nature (2 wheels);Wheelchair - manual      Prior Function Prior Level of Function : Needs assist  Cognitive Assist : Mobility (cognitive);ADLs (cognitive) Mobility (Cognitive): Intermittent cues ADLs (Cognitive): Intermittent cues Physical Assist : Mobility (physical);ADLs (physical) Mobility (physical): Bed mobility;Gait   Mobility Comments: used a RW per pt       Hand Dominance   Dominant Hand: Right    Extremity/Trunk Assessment   Upper Extremity Assessment Upper Extremity Assessment: Defer to OT evaluation    Lower Extremity Assessment Lower Extremity Assessment: Generalized weakness    Cervical / Trunk Assessment Cervical / Trunk Assessment: Normal  Communication   Communication: HOH  Cognition Arousal/Alertness: Lethargic Behavior  During Therapy: Flat affect Overall Cognitive Status: Within Functional Limits for tasks assessed                                          General Comments General comments (skin integrity, edema, etc.): 66 bpm, 195/90, 21, 91% RA    Exercises General Exercises - Lower Extremity Ankle Circles/Pumps: AROM;Both;10 reps;Seated Long Arc Quad: AROM;Both;10 reps;Seated   Assessment/Plan    PT Assessment Patient needs continued PT services  PT Problem List Decreased activity tolerance;Decreased balance;Decreased mobility;Decreased knowledge of use of DME;Decreased safety awareness;Decreased knowledge of precautions        PT Treatment Interventions DME instruction;Gait training;Functional mobility training;Therapeutic activities;Therapeutic exercise;Balance training;Patient/family education    PT Goals (Current goals can be found in the Care Plan section)  Acute Rehab PT Goals Patient Stated Goal: to go home PT Goal Formulation: With patient Time For Goal Achievement: 05/20/21 Potential to Achieve Goals: Fair    Frequency Min 3X/week   Barriers to discharge        Co-evaluation               AM-PAC PT "6 Clicks" Mobility  Outcome Measure Help needed turning from your back to your side while in a flat bed without using bedrails?: None Help needed moving from lying on your back to sitting on the side of a flat bed without using bedrails?: None Help needed moving to and from a bed to a chair (including a wheelchair)?: A Little Help needed standing up from a chair using your arms (e.g., wheelchair or bedside chair)?: A Little Help needed to walk in hospital room?: A Lot Help needed climbing 3-5 steps with a railing? : A Lot 6 Click Score: 18    End of Session Equipment Utilized During Treatment: Gait belt Activity Tolerance: Patient limited by fatigue Patient left: with call bell/phone within reach;in bed;with bed alarm set Nurse Communication: Mobility status PT Visit Diagnosis: Muscle weakness (generalized) (M62.81)    Time: 1740-8144 PT Time Calculation (min) (ACUTE ONLY): 19 min   Charges:   PT Evaluation $PT Eval Moderate Complexity: 1 Mod          [name],PT Acute Rehab Services [phone] (pager)   [name] 05/06/2021, 11:42 AM

## 2021-05-06 NOTE — Progress Notes (Signed)
PROGRESS NOTE    Albert Harris  ENI:[id number] DOB: 22-Aug-1955 DOA: 05/05/2021 PCP: Kerin Perna, NP    Brief Narrative:  65 year old gentleman with history of ESRD on hemodialysis, COPD, peripheral vascular disease, hypertension, hyperlipidemia and severe noncompliance to dialysis regimen presented with fall at home.  Not going to hemodialysis since last 1 week.  Not eating well since Thanksgiving as per family.  In the emergency room hemodynamically stable.  Potassium 7.  AST and ALT 2428/2294 with bilirubin of 2.  Urgently hemodialyzed.  Skeletal survey negative.   Assessment & Plan:   Principal Problem:   Hyperkalemia Active Problems:   ESRD (end stage renal disease) (HCC)   Hypertension   COPD (chronic obstructive pulmonary disease) (HCC)   Elevated LFTs   Hypoglycemia   Thrombocytopenia (HCC)   Fall at home, initial encounter  ESRD on hemodialysis missed hemodialysis resulting in hyperkalemia: Treated with temporizing measures with calcium gluconate, insulin and dextrose infusion as well bicarbonate. Underwent emergent hemodialysis with improvement of levels. Due to multiple missed hemodialysis, patient will need another subsequent dialysis and is scheduled for tomorrow 11/30.  Elevated LFTs: Consistent with shock liver.  CT scan of the abdomen pelvis with no obvious etiology or abnormalities. Hepatitis panel negative. Blood pressure improving and LFTs are already normalizing.  Recheck tomorrow morning. Tylenol and salicylate levels are normal. Right upper quadrant ultrasound is essentially normal with unremarkable gallbladder and bile duct.  Hypoglycemia: Due to poor oral intake and insulin use per correction of hyperkalemia.  Regular diet.  Frequent monitoring and supplementation.  COPD: Stable.  Albuterol as needed.  Decreased mobility/frequent falls: Fall precautions.  PT OT.   DVT prophylaxis: heparin injection 5,000 Units Start: 05/06/21 0000   Code  Status: Full code Family Communication: None Disposition Plan: Status is: Inpatient  Remains inpatient appropriate because: Significant electrolyte abnormalities including hyperkalemia and hypoglycemia.         Consultants:  Nephrology  Procedures:  Hemodialysis  Antimicrobials:  None   Subjective: Patient seen and examined.  Tells me that he is so tired he just wants to sleep.  Currently denies any complaints.  Denies any nausea vomiting.  Objective: Vitals:   05/06/21 0605 05/06/21 0610 05/06/21 0831 05/06/21 1000  BP: (!) 142/70 (!) 180/87 (!) 195/90 (!) 168/95  Pulse: 62 65 70 64  Resp:  18 16 18   Temp:  97.8 F (36.6 C) (!) 97.4 F (36.3 C) (!) 97.5 F (36.4 C)  TempSrc:  Oral Oral Oral  SpO2:  93% 95% 92%  Weight:   74.5 kg     Intake/Output Summary (Last 24 hours) at 05/06/2021 1344 Last data filed at 05/06/2021 0610 Gross per 24 hour  Intake 47.51 ml  Output 3000 ml  Net -2952.49 ml   Filed Weights   05/06/21 0831  Weight: 74.5 kg    Examination:  General exam: Appears calm and comfortable  Frail debilitated.  Not in any distress.  On minimal oxygen for comfort.   Respiratory system: Clear to auscultation. Respiratory effort normal. Cardiovascular system: S1 & S2 heard, RRR.  Right upper extremity AV fistula with thrill. Gastrointestinal system: Nontender. Central nervous system: Alert and oriented. No focal neurological deficits.  Mostly sleepy. Extremities: Symmetric 5 x 5 power. Skin: No rashes, lesions or ulcers Psychiatry: Flat affect.    Data Reviewed: I have personally reviewed following labs and imaging studies  CBC: Recent Labs  Lab 05/05/21 1534 05/06/21 0100 05/06/21 0300  WBC 13.0* 11.7* 11.8*  NEUTROABS 10.5*  --   --   HGB 12.0* 10.7* 11.0*  HCT 38.5* 34.1* 34.0*  MCV 101.3* 100.3* 98.0  PLT 102* 97* 94*   Basic Metabolic Panel: Recent Labs  Lab 05/05/21 1534 05/06/21 0100 05/06/21 0300  NA 139 138 139  K  7.0* 5.9* 6.0*  CL 95* 95* 95*  CO2 17* 20* 22  GLUCOSE 43* 100* 84  BUN 103* 107* 107*  CREATININE 16.09* 16.33* 16.65*  CALCIUM 10.1 9.4 9.4  PHOS  --   --  11.3*   GFR: Estimated Creatinine Clearance: 4.2 mL/min (A) (by C-G formula based on SCr of 16.65 mg/dL (H)). Liver Function Tests: Recent Labs  Lab 05/05/21 1534 05/06/21 0100 05/06/21 0300  AST 2,428* 1,777*  --   ALT 2,294* 2,008*  --   ALKPHOS 109 104  --   BILITOT 2.1* 2.1*  --   PROT 6.6 5.8*  --   ALBUMIN 3.6 3.2* 3.2*   No results for input(s): LIPASE, AMYLASE in the last 168 hours. No results for input(s): AMMONIA in the last 168 hours. Coagulation Profile: Recent Labs  Lab 05/06/21 0100  INR 2.8*   Cardiac Enzymes: No results for input(s): CKTOTAL, CKMB, CKMBINDEX, TROPONINI in the last 168 hours. BNP (last 3 results) No results for input(s): PROBNP in the last 8760 hours. HbA1C: No results for input(s): HGBA1C in the last 72 hours. CBG: Recent Labs  Lab 05/06/21 0718 05/06/21 0720 05/06/21 0739 05/06/21 0804 05/06/21 0833  GLUCAP 54* 52* 58* 98 93   Lipid Profile: No results for input(s): CHOL, HDL, LDLCALC, TRIG, CHOLHDL, LDLDIRECT in the last 72 hours. Thyroid Function Tests: No results for input(s): TSH, T4TOTAL, FREET4, T3FREE, THYROIDAB in the last 72 hours. Anemia Panel: No results for input(s): VITAMINB12, FOLATE, FERRITIN, TIBC, IRON, RETICCTPCT in the last 72 hours. Sepsis Labs: No results for input(s): PROCALCITON, LATICACIDVEN in the last 168 hours.  Recent Results (from the past 240 hour(s))  Resp Panel by RT-PCR (Flu A&B, Covid) Nasopharyngeal Swab     Status: None   Collection Time: 05/05/21 10:42 PM   Specimen: Nasopharyngeal Swab; Nasopharyngeal(NP) swabs in vial transport medium  Result Value Ref Range Status   SARS Coronavirus 2 by RT PCR NEGATIVE NEGATIVE Final    Comment: (NOTE) SARS-CoV-2 target nucleic acids are NOT DETECTED.  The SARS-CoV-2 RNA is generally  detectable in upper respiratory specimens during the acute phase of infection. The lowest concentration of SARS-CoV-2 viral copies this assay can detect is 138 copies/mL. A negative result does not preclude SARS-Cov-2 infection and should not be used as the sole basis for treatment or other patient management decisions. A negative result may occur with  improper specimen collection/handling, submission of specimen other than nasopharyngeal swab, presence of viral mutation(s) within the areas targeted by this assay, and inadequate number of viral copies(<138 copies/mL). A negative result must be combined with clinical observations, patient history, and epidemiological information. The expected result is Negative.  Fact Sheet for Patients:  EntrepreneurPulse.com.au  Fact Sheet for Healthcare Providers:  IncredibleEmployment.be  This test is no t yet approved or cleared by the [place] FDA and  has been authorized for detection and/or diagnosis of SARS-CoV-2 by FDA under an Emergency Use Authorization (EUA). This EUA will remain  in effect (meaning this test can be used) for the duration of the COVID-19 declaration under Section 564(b)(1) of the Act, 21 U.S.C.section 360bbb-3(b)(1), unless the authorization is terminated  or revoked sooner.  Influenza A by PCR NEGATIVE NEGATIVE Final   Influenza B by PCR NEGATIVE NEGATIVE Final    Comment: (NOTE) The Xpert Xpress SARS-CoV-2/FLU/RSV plus assay is intended as an aid in the diagnosis of influenza from Nasopharyngeal swab specimens and should not be used as a sole basis for treatment. Nasal washings and aspirates are unacceptable for Xpert Xpress SARS-CoV-2/FLU/RSV testing.  Fact Sheet for Patients: EntrepreneurPulse.com.au  Fact Sheet for Healthcare Providers: IncredibleEmployment.be  This test is not yet approved or cleared by the [place] FDA  and has been authorized for detection and/or diagnosis of SARS-CoV-2 by FDA under an Emergency Use Authorization (EUA). This EUA will remain in effect (meaning this test can be used) for the duration of the COVID-19 declaration under Section 564(b)(1) of the Act, 21 U.S.C. section 360bbb-3(b)(1), unless the authorization is terminated or revoked.  Performed at [facility], [place] [address], [place], [name] [address]          Radiology Studies: DG Chest 2 View  Result Date: 05/05/2021 CLINICAL DATA:  Weakness, fell last night EXAM: CHEST - 2 VIEW COMPARISON:  12/14/2020 FINDINGS: Enlargement of cardiac silhouette. Atherosclerotic calcification aorta. Slightly prominent pulmonary vascular structures at the hila unchanged. Lungs clear. No pulmonary infiltrate, pleural effusion, or pneumothorax. Diffuse osseous demineralization. No definite fractures identified. IMPRESSION: Enlargement of cardiac silhouette. No acute abnormalities. Electronically Signed   By: Lavonia Dana M.D.   On: 05/05/2021 16:34   DG Thoracic Spine 2 View  Result Date: 05/05/2021 CLINICAL DATA:  Back pain post fall last night EXAM: THORACIC SPINE 2 VIEWS COMPARISON:  None FINDINGS: Vertebral body and disc space heights maintained. No fracture, subluxation, or bone destruction. Atherosclerotic calcifications aorta. IMPRESSION: No acute osseous abnormalities. Aortic Atherosclerosis (ICD10-I70.0). Electronically Signed   By: Lavonia Dana M.D.   On: 05/05/2021 16:35   CT Head Wo Contrast  Result Date: 05/05/2021 CLINICAL DATA:  Trauma. EXAM: CT HEAD WITHOUT CONTRAST CT CERVICAL SPINE WITHOUT CONTRAST TECHNIQUE: Multidetector CT imaging of the head and cervical spine was performed following the standard protocol without intravenous contrast. Multiplanar CT image reconstructions of the cervical spine were also generated. COMPARISON:  None. FINDINGS: CT HEAD FINDINGS Brain: The ventricles and sulci appropriate size for  patient's age. 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 There is no acute intracranial hemorrhage. No mass effect or midline shift no extra-axial fluid collection. Vascular: No hyperdense vessel or unexpected calcification. Skull: Normal. Negative for fracture or focal lesion. Sinuses/Orbits: No acute finding. Other: There is air within the superior sagittal sinus, likely iatrogenic and introduced via IV access. CT CERVICAL SPINE FINDINGS Alignment: No acute subluxation. There is straightening of normal cervical lordosis which may be positional or due to muscle spasm. Skull base and vertebrae: No acute fracture. Soft tissues and spinal canal: No prevertebral fluid or swelling. No visible canal hematoma. Disc levels: Multilevel degenerative changes with disc space narrowing and endplate irregularity and spurring. Upper chest: Negative. Other: Bilateral carotid bulb calcified plaques. IMPRESSION: 1. No acute intracranial pathology. 2. Moderate air within the superior sagittal sinus, likely iatrogenic and introduced via IV access. 3. No acute/traumatic cervical spine pathology. Multilevel degenerative changes. These results were called by telephone at the time of interpretation on 05/05/2021 at 6:51 pm to Dr. Aletta Edouard, who verbally acknowledged these results. Electronically Signed   By: Anner Crete M.D.   On: 05/05/2021 18:53   CT Cervical Spine Wo Contrast  Result Date: 05/05/2021 CLINICAL DATA:  Trauma. EXAM: CT HEAD WITHOUT CONTRAST CT CERVICAL SPINE WITHOUT CONTRAST TECHNIQUE: Multidetector CT imaging of the head and cervical spine was performed following the standard protocol without intravenous contrast. Multiplanar CT image reconstructions of the cervical spine were also generated. COMPARISON:  None. FINDINGS: CT HEAD FINDINGS Brain: The ventricles and sulci appropriate size for patient's age. Small hypodense focus in the anterior  limb of the right internal capsule, likely an area of old infarct. There is no acute intracranial hemorrhage. No mass effect or midline shift no extra-axial fluid collection. Vascular: No hyperdense vessel or unexpected calcification. Skull: Normal. Negative for fracture or focal lesion. Sinuses/Orbits: No acute finding. Other: There is air within the superior sagittal sinus, likely iatrogenic and introduced via IV access. CT CERVICAL SPINE FINDINGS Alignment: No acute subluxation. There is straightening of normal cervical lordosis which may be positional or due to muscle spasm. Skull base and vertebrae: No acute fracture. Soft tissues and spinal canal: No prevertebral fluid or swelling. No visible canal hematoma. Disc levels: Multilevel degenerative changes with disc space narrowing and endplate irregularity and spurring. Upper chest: Negative. Other: Bilateral carotid bulb calcified plaques. IMPRESSION: 1. No acute intracranial pathology. 2. Moderate air within the superior sagittal sinus, likely iatrogenic and introduced via IV access. 3. No acute/traumatic cervical spine pathology. Multilevel degenerative changes. These results were called by telephone at the time of interpretation on 05/05/2021 at 6:51 pm to Dr. Aletta Edouard, who verbally acknowledged these results. Electronically Signed   By: Anner Crete M.D.   On: 05/05/2021 18:53   CT ABDOMEN PELVIS W CONTRAST  Result Date: 05/05/2021 CLINICAL DATA:  Abdominal trauma.  Fall EXAM: CT ABDOMEN AND PELVIS WITH CONTRAST TECHNIQUE: Multidetector CT imaging of the abdomen and pelvis was performed using the standard protocol following bolus administration of intravenous contrast. CONTRAST:  21mL OMNIPAQUE IOHEXOL 350 MG/ML SOLN COMPARISON:  None. FINDINGS: Lower chest: Lung bases are clear. Hepatobiliary: No focal hepatic lesion. No biliary duct dilatation. Common bile duct is normal. Pancreas: Pancreas is normal. No ductal dilatation. No pancreatic  inflammation. Spleen: Normal spleen Adrenals/urinary tract: Adrenal glands normal. Kidneys are atrophic. Ureters and bladder normal. Stomach/Bowel: Stomach, small bowel, appendix, and cecum are normal. Multiple diverticula of the descending colon and sigmoid colon without acute inflammation. Vascular/Lymphatic: Abdominal aorta is normal caliber with atherosclerotic calcification. There is no retroperitoneal or periportal lymphadenopathy. No pelvic lymphadenopathy. Reproductive: Unremarkable Other: Small volume of free fluid along the margin of the liver. Musculoskeletal: No aggressive osseous lesion.  No fracture. IMPRESSION: 1. No evidence of abdominal trauma. 2. Atrophic kidneys and small volume intraperitoneal free fluid. 3. LEFT colon diverticulosis. 4. Dense atherosclerotic disease of the aorta. Aortic Atherosclerosis (ICD10-I70.0). Electronically Signed   By: Suzy Bouchard M.D.   On: 05/05/2021 21:55   US Abdomen Limited RUQ (LIVER/GB)  Result Date: 05/06/2021 CLINICAL DATA:  Right upper quadrant pain.  Recent fall. EXAM: ULTRASOUND ABDOMEN LIMITED RIGHT UPPER QUADRANT COMPARISON:  CT with IV contrast 05/05/2021, 12/14/2020. FINDINGS: Gallbladder: No gallstones or wall thickening visualized. No sonographic Murphy sign noted by sonographer. Common bile duct: Diameter: 4.9 mm. Liver: No focal lesion identified. Within normal limits in parenchymal echogenicity. Portal vein is patent on color Doppler imaging with normal direction of blood flow towards the liver. Other: Partial right renal atrophy with increased echogenicity of medical renal disease. Small volume of upper abdominal ascites. IMPRESSION: 1. No acute sonographic findings. 2. Unremarkable gallbladder. 3. Echogenic partially atrophic right kidney. 4.  Small amount of perihepatic ascites. Electronically Signed   By: Telford Nab M.D.   On: 05/06/2021 01:04        Scheduled Meds:  calcium acetate  1,334 mg Oral TID WC   Chlorhexidine  Gluconate Cloth  6 each Topical Q0600   heparin  5,000 Units Subcutaneous Q8H   metoprolol tartrate  25 mg Oral BID   sodium chloride flush  3 mL Intravenous Q12H   Continuous Infusions:  sodium chloride     sodium chloride       LOS: 1 day    Time spent: 34 minutes    Barb Merino, MD [facility] Pager [phone]

## 2021-05-06 NOTE — ED Notes (Signed)
Attempted report 

## 2021-05-06 NOTE — Progress Notes (Signed)
[facility] Progress Note   Subjective:   Patient seen and examined at bedside post dialysis.  Blood sugar dropped post HD with associated lethargy.  Patient given juice by nurse.  Denies CP, SOB and n/v/d.  Hiccups throughout treatment.   Objective Vitals:   05/06/21 0300 05/06/21 0330 05/06/21 0400 05/06/21 0430  BP: (!) 191/94 (!) 153/65 129/69 133/72  Pulse: 69 68 62 62  Resp:      Temp:      TempSrc:      SpO2:       Physical Exam General:chronically ill appearing  somnolent male in NAD Heart:RRR, no mrg Lungs:CTAB, nml WOB Abdomen:soft, NTND Extremities:no LE edema Dialysis Access: RU AVG   Filed Weights    Intake/Output Summary (Last 24 hours) at 05/06/2021 0703 Last data filed at 05/05/2021 2025 Gross per 24 hour  Intake 47.51 ml  Output --  Net 47.51 ml    Additional Objective Labs: Basic Metabolic Panel: Recent Labs  Lab 05/05/21 1534 05/06/21 0100 05/06/21 0300  NA 139 138 139  K 7.0* 5.9* 6.0*  CL 95* 95* 95*  CO2 17* 20* 22  GLUCOSE 43* 100* 84  BUN 103* 107* 107*  CREATININE 16.09* 16.33* 16.65*  CALCIUM 10.1 9.4 9.4  PHOS  --   --  11.3*   Liver Function Tests: Recent Labs  Lab 05/05/21 1534 05/06/21 0100 05/06/21 0300  AST 2,428* 1,777*  --   ALT 2,294* 2,008*  --   ALKPHOS 109 104  --   BILITOT 2.1* 2.1*  --   PROT 6.6 5.8*  --   ALBUMIN 3.6 3.2* 3.2*   CBC: Recent Labs  Lab 05/05/21 1534 05/06/21 0100 05/06/21 0300  WBC 13.0* 11.7* 11.8*  NEUTROABS 10.5*  --   --   HGB 12.0* 10.7* 11.0*  HCT 38.5* 34.1* 34.0*  MCV 101.3* 100.3* 98.0  PLT 102* 97* 94*   CBG: Recent Labs  Lab 05/05/21 1719 05/05/21 1752 05/05/21 2054 05/06/21 0041  GLUCAP 38* 67* 152* 108*    Lab Results  Component Value Date   INR 2.8 (H) 05/06/2021   INR 1.1 12/15/2020   INR 1.2 06/16/2020   Studies/Results: DG Chest 2 View  Result Date: 05/05/2021 CLINICAL DATA:  Weakness, fell last night EXAM: CHEST - 2 VIEW  COMPARISON:  12/14/2020 FINDINGS: Enlargement of cardiac silhouette. Atherosclerotic calcification aorta. Slightly prominent pulmonary vascular structures at the hila unchanged. Lungs clear. No pulmonary infiltrate, pleural effusion, or pneumothorax. Diffuse osseous demineralization. No definite fractures identified. IMPRESSION: Enlargement of cardiac silhouette. No acute abnormalities. Electronically Signed   By: Lavonia Dana M.D.   On: 05/05/2021 16:34   DG Thoracic Spine 2 View  Result Date: 05/05/2021 CLINICAL DATA:  Back pain post fall last night EXAM: THORACIC SPINE 2 VIEWS COMPARISON:  None FINDINGS: Vertebral body and disc space heights maintained. No fracture, subluxation, or bone destruction. Atherosclerotic calcifications aorta. IMPRESSION: No acute osseous abnormalities. Aortic Atherosclerosis (ICD10-I70.0). Electronically Signed   By: Lavonia Dana M.D.   On: 05/05/2021 16:35   CT Head Wo Contrast  Result Date: 05/05/2021 CLINICAL DATA:  Trauma. EXAM: CT HEAD WITHOUT CONTRAST CT CERVICAL SPINE WITHOUT CONTRAST TECHNIQUE: Multidetector CT imaging of the head and cervical spine was performed following the standard protocol without intravenous contrast. Multiplanar CT image reconstructions of the cervical spine were also generated. COMPARISON:  None. FINDINGS: CT HEAD FINDINGS Brain: The ventricles and sulci appropriate size for patient's age. Small hypodense focus in the anterior  limb of the right internal capsule, likely an area of old infarct. There is no acute intracranial hemorrhage. No mass effect or midline shift no extra-axial fluid collection. Vascular: No hyperdense vessel or unexpected calcification. Skull: Normal. Negative for fracture or focal lesion. Sinuses/Orbits: No acute finding. Other: There is air within the superior sagittal sinus, likely iatrogenic and introduced via IV access. CT CERVICAL SPINE FINDINGS Alignment: No acute subluxation. There is straightening of normal cervical  lordosis which may be positional or due to muscle spasm. Skull base and vertebrae: No acute fracture. Soft tissues and spinal canal: No prevertebral fluid or swelling. No visible canal hematoma. Disc levels: Multilevel degenerative changes with disc space narrowing and endplate irregularity and spurring. Upper chest: Negative. Other: Bilateral carotid bulb calcified plaques. IMPRESSION: 1. No acute intracranial pathology. 2. Moderate air within the superior sagittal sinus, likely iatrogenic and introduced via IV access. 3. No acute/traumatic cervical spine pathology. Multilevel degenerative changes. These results were called by telephone at the time of interpretation on 05/05/2021 at 6:51 pm to Dr. Aletta Edouard, who verbally acknowledged these results. Electronically Signed   By: Anner Crete M.D.   On: 05/05/2021 18:53   CT Cervical Spine Wo Contrast  Result Date: 05/05/2021 CLINICAL DATA:  Trauma. EXAM: CT HEAD WITHOUT CONTRAST CT CERVICAL SPINE WITHOUT CONTRAST TECHNIQUE: Multidetector CT imaging of the head and cervical spine was performed following the standard protocol without intravenous contrast. Multiplanar CT image reconstructions of the cervical spine were also generated. COMPARISON:  None. FINDINGS: CT HEAD FINDINGS Brain: The ventricles and sulci appropriate size for patient's age. 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 There is no acute intracranial hemorrhage. No mass effect or midline shift no extra-axial fluid collection. Vascular: No hyperdense vessel or unexpected calcification. Skull: Normal. Negative for fracture or focal lesion. Sinuses/Orbits: No acute finding. Other: There is air within the superior sagittal sinus, likely iatrogenic and introduced via IV access. CT CERVICAL SPINE FINDINGS Alignment: No acute subluxation. There is straightening of normal cervical lordosis which may be positional or due to muscle  spasm. Skull base and vertebrae: No acute fracture. Soft tissues and spinal canal: No prevertebral fluid or swelling. No visible canal hematoma. Disc levels: Multilevel degenerative changes with disc space narrowing and endplate irregularity and spurring. Upper chest: Negative. Other: Bilateral carotid bulb calcified plaques. IMPRESSION: 1. No acute intracranial pathology. 2. Moderate air within the superior sagittal sinus, likely iatrogenic and introduced via IV access. 3. No acute/traumatic cervical spine pathology. Multilevel degenerative changes. These results were called by telephone at the time of interpretation on 05/05/2021 at 6:51 pm to Dr. Aletta Edouard, who verbally acknowledged these results. Electronically Signed   By: Anner Crete M.D.   On: 05/05/2021 18:53   CT ABDOMEN PELVIS W CONTRAST  Result Date: 05/05/2021 CLINICAL DATA:  Abdominal trauma.  Fall EXAM: CT ABDOMEN AND PELVIS WITH CONTRAST TECHNIQUE: Multidetector CT imaging of the abdomen and pelvis was performed using the standard protocol following bolus administration of intravenous contrast. CONTRAST:  22mL OMNIPAQUE IOHEXOL 350 MG/ML SOLN COMPARISON:  None. FINDINGS: Lower chest: Lung bases are clear. Hepatobiliary: No focal hepatic lesion. No biliary duct dilatation. Common bile duct is normal. Pancreas: Pancreas is normal. No ductal dilatation. No pancreatic inflammation. Spleen: Normal spleen Adrenals/urinary tract: Adrenal glands normal. Kidneys are atrophic. Ureters and bladder normal. Stomach/Bowel: Stomach, small bowel, appendix, and cecum are normal. Multiple diverticula of the descending colon and sigmoid  colon without acute inflammation. Vascular/Lymphatic: Abdominal aorta is normal caliber with atherosclerotic calcification. There is no retroperitoneal or periportal lymphadenopathy. No pelvic lymphadenopathy. Reproductive: Unremarkable Other: Small volume of free fluid along the margin of the liver. Musculoskeletal: No  aggressive osseous lesion.  No fracture. IMPRESSION: 1. No evidence of abdominal trauma. 2. Atrophic kidneys and small volume intraperitoneal free fluid. 3. LEFT colon diverticulosis. 4. Dense atherosclerotic disease of the aorta. Aortic Atherosclerosis (ICD10-I70.0). Electronically Signed   By: Suzy Bouchard M.D.   On: 05/05/2021 21:55   US Abdomen Limited RUQ (LIVER/GB)  Result Date: 05/06/2021 CLINICAL DATA:  Right upper quadrant pain.  Recent fall. EXAM: ULTRASOUND ABDOMEN LIMITED RIGHT UPPER QUADRANT COMPARISON:  CT with IV contrast 05/05/2021, 12/14/2020. FINDINGS: Gallbladder: No gallstones or wall thickening visualized. No sonographic Murphy sign noted by sonographer. Common bile duct: Diameter: 4.9 mm. Liver: No focal lesion identified. Within normal limits in parenchymal echogenicity. Portal vein is patent on color Doppler imaging with normal direction of blood flow towards the liver. Other: Partial right renal atrophy with increased echogenicity of medical renal disease. Small volume of upper abdominal ascites. IMPRESSION: 1. No acute sonographic findings. 2. Unremarkable gallbladder. 3. Echogenic partially atrophic right kidney. 4. Small amount of perihepatic ascites. Electronically Signed   By: Telford Nab M.D.   On: 05/06/2021 01:04    Medications:  sodium chloride     sodium chloride      calcium acetate  1,334 mg Oral TID WC   Chlorhexidine Gluconate Cloth  6 each Topical Q0600   heparin  5,000 Units Subcutaneous Q8H   metoprolol tartrate  25 mg Oral BID   sodium chloride flush  3 mL Intravenous Q12H    Dialysis Orders: Center: [facility]  on TTS . EDW 70kg HD Bath 2K/2Ca  Time 4 hours Heparin none. Access R forearm AVG BFR 350 DFR 800    Micera 100 mcg every 2 weeks   Assessment/Plan:  Hyperkalemia - due to noncompliance with dialysis.  Given IV Ca, insulin/D50, and bicarb.  HD completed early this AM.   Hypoglycemia - post HD.  Juice given by HD nurse.    ESRD -  as  above, missed 2 sessions of HD and shortened a 3rd over the last week. Next HD on 12/1.   Hypertension/volume  - Blood pressure improved post HD.  Does not appear grossly volume overloaded.  CXR with no acute abnormalities. Continue UF as tolerated.   Anemia  - stable  Metabolic bone disease -  continue calcium acetate with meals, hold vit D for now given elevated Ca and phos.   Nutrition -  renal diet Right flank pain - s/p fall.  No acute findings on imaging.  Elevated AST and ALT - unclear etiology.  abd CT with no acute abnormalities.  Follow LFT's.   Jen Mow, PA-C [facility] 05/06/2021,7:03 AM  LOS: 1 day

## 2021-05-07 ENCOUNTER — Encounter (HOSPITAL_COMMUNITY): Payer: Self-pay

## 2021-05-07 ENCOUNTER — Other Ambulatory Visit: Payer: Self-pay

## 2021-05-07 DIAGNOSIS — E875 Hyperkalemia: Secondary | ICD-10-CM | POA: Diagnosis not present

## 2021-05-07 LAB — HEPATIC FUNCTION PANEL
ALT: 1563 U/L — ABNORMAL HIGH (ref 0–44)
AST: 544 U/L — ABNORMAL HIGH (ref 15–41)
Albumin: 3.2 g/dL — ABNORMAL LOW (ref 3.5–5.0)
Alkaline Phosphatase: 126 U/L (ref 38–126)
Bilirubin, Direct: 1 mg/dL — ABNORMAL HIGH (ref 0.0–0.2)
Indirect Bilirubin: 1.1 mg/dL — ABNORMAL HIGH (ref 0.3–0.9)
Total Bilirubin: 2.1 mg/dL — ABNORMAL HIGH (ref 0.3–1.2)
Total Protein: 6.2 g/dL — ABNORMAL LOW (ref 6.5–8.1)

## 2021-05-07 LAB — COMPREHENSIVE METABOLIC PANEL
ALT: UNDETERMINED U/L (ref 0–44)
AST: UNDETERMINED U/L (ref 15–41)
Albumin: 3.1 g/dL — ABNORMAL LOW (ref 3.5–5.0)
Alkaline Phosphatase: 120 U/L (ref 38–126)
Anion gap: 18 — ABNORMAL HIGH (ref 5–15)
BUN: 54 mg/dL — ABNORMAL HIGH (ref 8–23)
CO2: 22 mmol/L (ref 22–32)
Calcium: 9.5 mg/dL (ref 8.9–10.3)
Chloride: 95 mmol/L — ABNORMAL LOW (ref 98–111)
Creatinine, Ser: 11.01 mg/dL — ABNORMAL HIGH (ref 0.61–1.24)
GFR, Estimated: 5 mL/min — ABNORMAL LOW (ref >60)
Glucose, Bld: 90 mg/dL (ref 70–99)
Potassium: 5.7 mmol/L — ABNORMAL HIGH (ref 3.5–5.1)
Sodium: 135 mmol/L (ref 135–145)
Total Bilirubin: UNDETERMINED mg/dL (ref 0.3–1.2)
Total Protein: 5.7 g/dL — ABNORMAL LOW (ref 6.5–8.1)

## 2021-05-07 LAB — HEPATITIS B SURFACE ANTIBODY, QUANTITATIVE: Hep B S AB Quant (Post): 71.8 m[IU]/mL (ref >9.9)

## 2021-05-07 LAB — GLUCOSE, CAPILLARY: Glucose-Capillary: 85 mg/dL (ref 70–99)

## 2021-05-07 MED ORDER — CHLORHEXIDINE GLUCONATE CLOTH 2 % EX PADS: 6.0000 | MEDICATED_PAD | Freq: Every day | CUTANEOUS | Status: DC

## 2021-05-07 MED ORDER — OXYCODONE HCL 5 MG PO TABS
5.0000 mg | ORAL_TABLET | Freq: Four times a day (QID) | ORAL | 0 refills | Status: DC | PRN
Start: 2021-05-07 — End: 2021-05-07
  Filled 2021-05-07: qty 12, 3d supply, fill #0

## 2021-05-07 MED ORDER — LIDOCAINE 5 % EX PTCH
1.0000 | MEDICATED_PATCH | CUTANEOUS | 0 refills | Status: DC
  Filled 2021-05-07: qty 15, 15d supply, fill #0

## 2021-05-07 MED ORDER — FAMOTIDINE 20 MG PO TABS
20.0000 mg | ORAL_TABLET | Freq: Two times a day (BID) | ORAL | 0 refills | Status: DC
  Filled 2021-05-07: qty 60, 30d supply, fill #0

## 2021-05-07 MED ORDER — SODIUM ZIRCONIUM CYCLOSILICATE 10 G PO PACK
10.0000 g | PACK | Freq: Once | ORAL | Status: AC
  Administered 2021-05-07: 10 g via ORAL
  Filled 2021-05-07: qty 1

## 2021-05-07 MED ORDER — FAMOTIDINE 20 MG PO TABS
20.0000 mg | ORAL_TABLET | Freq: Two times a day (BID) | ORAL | Status: DC
  Administered 2021-05-07 (×2): 20 mg via ORAL
  Filled 2021-05-07 (×2): qty 1

## 2021-05-07 NOTE — Progress Notes (Addendum)
Pt receives out-pt HD at [facility] SW/[facility] on TTS. Pt arrives at 10:30 for 10:45 chair time. Clinic confirms pt is non-compliant with treatments. Contacted attending regarding possible d/c date in an attempt to avoid HD on day of d/c. Will assist as needed.  Melven Sartorius Renal Navigator [phone]  Addendum at 3:31 pm: Pt to d/c to home today. Contacted pt's clinic to make them aware pt to resume care tomorrow.

## 2021-05-07 NOTE — Progress Notes (Signed)
Physical Therapy Treatment Patient Details Name: Albert Harris MRN: [id number] DOB: Jun 17, 1955 Today's Date: 05/07/2021   History of Present Illness Pt is a 65 y/o male admitted 11/28 after a fall at home. Pt with low back pain from fall but imaging negative.  Found with hyperkalemia, hyperglycemia in setting of nonadherence to hemodialysis. PMH includes: ESRD on TTS HD, COPD, PVD, HTN, PFO, PNA, CVA, [name].    PT Comments    Pt tolerates treatment well, ambulating this session for multiple bouts. Pt demonstrates mild instability, benefiting from UE support of RW at this time. Pt shows some reduced awareness of safety, ambulating backward from bathroom despite instability. Pt will benefit from continued acute PT services to improve balance and safety awareness.  Recommendations for follow up therapy are one component of a multi-disciplinary discharge planning process, led by the attending physician.  Recommendations may be updated based on patient status, additional functional criteria and insurance authorization.  Follow Up Recommendations  Home health PT     Assistance Recommended at Discharge PRN  Equipment Recommendations  None recommended by PT (pt already owns RW)    Recommendations for Other Services       Precautions / Restrictions Precautions Precautions: Fall Restrictions Weight Bearing Restrictions: No     Mobility  Bed Mobility Overal bed mobility: Needs Assistance Bed Mobility: Supine to Sit     Supine to sit: Supervision;HOB elevated          Transfers Overall transfer level: Needs assistance Equipment used: Rolling walker (2 wheels) Transfers: Sit to/from Stand Sit to Stand: Min guard           General transfer comment: verbal cues for hand placement and technique    Ambulation/Gait Ambulation/Gait assistance: Min guard Gait Distance (Feet): 20 Feet (20' x 4) Assistive device: Rolling walker (2 wheels) Gait Pattern/deviations: Step-through  pattern Gait velocity: reduced Gait velocity interpretation: 1.31 - 2.62 ft/sec, indicative of limited community ambulator   General Gait Details: pt with slowed step-through gait, pt electing to backward walk from bathroom to bed on 2nd trial. PT provides encouragement to limit backward walking at this time due to weakness and instability   Stairs             Wheelchair Mobility    Modified Rankin (Stroke Patients Only)       Balance Overall balance assessment: Needs assistance Sitting-balance support: No upper extremity supported;Feet supported Sitting balance-Leahy Scale: Good     Standing balance support: Reliant on assistive device for balance Standing balance-Leahy Scale: Poor                              Cognition Arousal/Alertness: Awake/alert Behavior During Therapy: WFL for tasks assessed/performed Overall Cognitive Status: Impaired/Different from baseline Area of Impairment: Safety/judgement;Awareness                         Safety/Judgement: Decreased awareness of safety;Decreased awareness of deficits Awareness: Emergent            Exercises      General Comments General comments (skin integrity, edema, etc.): pt without nasal canula in upon PT arrival, sats fluctuating from 80-95% during session while on room air although with artifact throughout. When good waveform sats in low 90s, pt denies SOB      Pertinent Vitals/Pain Pain Assessment: Faces Faces Pain Scale: Hurts little more Pain Location: RUQ Pain Descriptors / Indicators: Grimacing  Pain Intervention(s): Monitored during session    Home Living                          Prior Function            PT Goals (current goals can now be found in the care plan section) Acute Rehab PT Goals Patient Stated Goal: to go home Progress towards PT goals: Progressing toward goals    Frequency    Min 3X/week      PT Plan Current plan remains appropriate     Co-evaluation              AM-PAC PT "6 Clicks" Mobility   Outcome Measure  Help needed turning from your back to your side while in a flat bed without using bedrails?: None Help needed moving from lying on your back to sitting on the side of a flat bed without using bedrails?: None Help needed moving to and from a bed to a chair (including a wheelchair)?: A Little Help needed standing up from a chair using your arms (e.g., wheelchair or bedside chair)?: A Little Help needed to walk in hospital room?: A Little Help needed climbing 3-5 steps with a railing? : A Lot 6 Click Score: 19    End of Session   Activity Tolerance: Patient tolerated treatment well Patient left: in bed;with call bell/phone within reach Nurse Communication: Mobility status PT Visit Diagnosis: Muscle weakness (generalized) (M62.81)     Time: 9417-4081 PT Time Calculation (min) (ACUTE ONLY): 23 min  Charges:  $Gait Training: 8-22 mins $Therapeutic Activity: 8-22 mins                     Zenaida Niece, PT, DPT Acute Rehabilitation Pager: [phone] Office [phone]    Zenaida Niece 05/07/2021, 9:17 AM

## 2021-05-07 NOTE — Discharge Summary (Signed)
Physician Discharge Summary  Albert Harris NOM:[id number] DOB: May 08, 1956 DOA: 05/05/2021  PCP: Kerin Perna, NP  Admit date: 05/05/2021 Discharge date: 05/07/2021  Admitted From: Home Disposition: Home  Recommendations for Outpatient Follow-up:  Go to hemodialysis tomorrow as you are scheduled.  Home Health: N/A Equipment/Devices: N/A  Discharge Condition: Stable CODE STATUS: Full code Diet recommendation: Low-salt diet  Discharge summary: 65 year old gentleman with history of ESRD on hemodialysis, COPD, peripheral vascular disease, hypertension, hyperlipidemia and severe noncompliance to dialysis regimen presented with fall at home.  Not going to hemodialysis since last 1 week.  Not eating well since Thanksgiving as per family.  In the emergency room hemodynamically stable.  Potassium 7.  AST and ALT 2428/2294 with bilirubin of 2.  Urgently hemodialyzed.  Skeletal survey negative.   # ESRD on hemodialysis, missed hemodialysis resulting in hyperkalemia: Treated with temporizing measures with calcium gluconate, insulin and dextrose infusion as well bicarbonate. Underwent emergent hemodialysis 11/29 with improvement of levels. Patient is scheduled for another hemodialysis session today before discharge.  He will keep up with his TTS schedule tomorrow.  # Elevated LFTs: Consistent with shock liver.  CT scan of the abdomen pelvis with no obvious etiology or abnormalities. Hepatitis panel negative. Blood pressure improving and LFTs are already normalizing.  Tylenol and salicylate levels are normal. Right upper quadrant ultrasound is essentially normal with unremarkable gallbladder and bile duct.   # Hypoglycemia: Due to poor oral intake and insulin use per correction of hyperkalemia.  Regular diet.  Blood sugars stabilized after eating regular diet.  # COPD: Stable.  Albuterol as needed.  #Fall: Patient has right lateral rib pain and tenderness that is reproducible.  Chest  x-ray with no evidence of visible fracture.  No other skeletal injury on survey.  Short course of opiates.  Patient will avoid Tylenol given abnormal liver function tests.  Also use local lidocaine patch.  Patient medically stabilized.  As he had missed multiple hemodialysis sessions, he will receive 1 more dialysis session today and discharged home.  Discharge Diagnoses:  Principal Problem:   Hyperkalemia Active Problems:   ESRD (end stage renal disease) (HCC)   Hypertension   COPD (chronic obstructive pulmonary disease) (HCC)   Elevated LFTs   Hypoglycemia   Thrombocytopenia ([place])   Fall at home, initial encounter    Discharge Instructions  Discharge Instructions     Diet - low sodium heart healthy   Complete by: As directed    Discharge instructions   Complete by: As directed    Go to your dialysis tomorrow morning as scheduled   Increase activity slowly   Complete by: As directed    No wound care   Complete by: As directed       Allergies as of 05/07/2021   No Known Allergies      Medication List     STOP taking these medications    ASPIRIN LOW DOSE 81 MG EC tablet Generic drug: aspirin   calcitRIOL 0.5 MCG capsule Commonly known as: ROCALTROL   furosemide 40 MG tablet Commonly known as: LASIX   hydrALAZINE 50 MG tablet Commonly known as: APRESOLINE   hydrOXYzine 50 MG tablet Commonly known as: ATARAX   losartan 50 MG tablet Commonly known as: COZAAR   ondansetron 4 MG disintegrating tablet Commonly known as: Zofran ODT   oxyCODONE-acetaminophen 7.5-325 MG tablet Commonly known as: Percocet   sodium bicarbonate 650 MG tablet       TAKE these medications  albuterol 108 (90 Base) MCG/ACT inhaler Commonly known as: VENTOLIN HFA INHALE 1-2 PUFFS INTO THE LUNGS EVERY 6 (SIX) HOURS AS NEEDED FOR WHEEZING OR SHORTNESS OF BREATH.   amLODipine 10 MG tablet Commonly known as: NORVASC Take 1 tablet (10 mg total) by mouth in the morning.    calcium acetate 667 MG capsule Commonly known as: PHOSLO Take 2 capsule by mouth three times a day with meals What changed: Another medication with the same name was removed. Continue taking this medication, and follow the directions you see here.   DIALYVITE 800 WITH ZINC 0.8 MG Tabs Take 1 tablet by mouth at bedtime.   lidocaine 5 % Commonly known as: LIDODERM Place 1 patch onto the skin daily. Remove & Discard patch within 12 hours or as directed by MD   lidocaine-prilocaine cream Commonly known as: EMLA Apply 1 application topically See admin instructions. Apply a small amount to access site 1-2 hours before dialysis,cover with occlusive dressing(saran wrap)   metoprolol tartrate 25 MG tablet Commonly known as: LOPRESSOR TAKE 1 TABLET BY MOUTH TWICE A DAY   nitroGLYCERIN 0.4 MG SL tablet Commonly known as: NITROSTAT Place 1 tablet (0.4 mg total) under the tongue every 5 (five) minutes x 3 doses as needed for chest pain.   oxyCODONE 5 MG immediate release tablet Commonly known as: Oxy IR/ROXICODONE Take 1 tablet (5 mg total) by mouth every 6 (six) hours as needed for up to 3 days for moderate pain or severe pain.        Follow-up Information     Kerin Perna, NP Follow up.   Specialty: Internal Medicine Contact information: [place] [address] [phone]         Sanda Klein, MD .   Specialty: Cardiology Contact information: [address] [place] [address] [phone]                No Known Allergies  Consultations: Nephrology   Procedures/Studies: DG Chest 2 View  Result Date: 05/05/2021 CLINICAL DATA:  Weakness, fell last night EXAM: CHEST - 2 VIEW COMPARISON:  12/14/2020 FINDINGS: Enlargement of cardiac silhouette. Atherosclerotic calcification aorta. Slightly prominent pulmonary vascular structures at the hila unchanged. Lungs clear. No pulmonary infiltrate, pleural effusion, or  pneumothorax. Diffuse osseous demineralization. No definite fractures identified. IMPRESSION: Enlargement of cardiac silhouette. No acute abnormalities. Electronically Signed   By: Lavonia Dana M.D.   On: 05/05/2021 16:34   DG Thoracic Spine 2 View  Result Date: 05/05/2021 CLINICAL DATA:  Back pain post fall last night EXAM: THORACIC SPINE 2 VIEWS COMPARISON:  None FINDINGS: Vertebral body and disc space heights maintained. No fracture, subluxation, or bone destruction. Atherosclerotic calcifications aorta. IMPRESSION: No acute osseous abnormalities. Aortic Atherosclerosis (ICD10-I70.0). Electronically Signed   By: Lavonia Dana M.D.   On: 05/05/2021 16:35   CT Head Wo Contrast  Result Date: 05/05/2021 CLINICAL DATA:  Trauma. EXAM: CT HEAD WITHOUT CONTRAST CT CERVICAL SPINE WITHOUT CONTRAST TECHNIQUE: Multidetector CT imaging of the head and cervical spine was performed following the standard protocol without intravenous contrast. Multiplanar CT image reconstructions of the cervical spine were also generated. COMPARISON:  None. FINDINGS: CT HEAD FINDINGS Brain: The ventricles and sulci appropriate size for patient's age. 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 There is no acute intracranial hemorrhage. No mass effect or midline shift no extra-axial fluid collection. Vascular: No hyperdense vessel or unexpected calcification. Skull:  Normal. Negative for fracture or focal lesion. Sinuses/Orbits: No acute finding. Other: There is air within the superior sagittal sinus, likely iatrogenic and introduced via IV access. CT CERVICAL SPINE FINDINGS Alignment: No acute subluxation. There is straightening of normal cervical lordosis which may be positional or due to muscle spasm. Skull base and vertebrae: No acute fracture. Soft tissues and spinal canal: No prevertebral fluid or swelling. No visible canal hematoma. Disc levels: Multilevel degenerative  changes with disc space narrowing and endplate irregularity and spurring. Upper chest: Negative. Other: Bilateral carotid bulb calcified plaques. IMPRESSION: 1. No acute intracranial pathology. 2. Moderate air within the superior sagittal sinus, likely iatrogenic and introduced via IV access. 3. No acute/traumatic cervical spine pathology. Multilevel degenerative changes. These results were called by telephone at the time of interpretation on 05/05/2021 at 6:51 pm to Dr. Aletta Edouard, who verbally acknowledged these results. Electronically Signed   By: Anner Crete M.D.   On: 05/05/2021 18:53   CT Cervical Spine Wo Contrast  Result Date: 05/05/2021 CLINICAL DATA:  Trauma. EXAM: CT HEAD WITHOUT CONTRAST CT CERVICAL SPINE WITHOUT CONTRAST TECHNIQUE: Multidetector CT imaging of the head and cervical spine was performed following the standard protocol without intravenous contrast. Multiplanar CT image reconstructions of the cervical spine were also generated. COMPARISON:  None. FINDINGS: CT HEAD FINDINGS Brain: The ventricles and sulci appropriate size for patient's age. 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 There is no acute intracranial hemorrhage. No mass effect or midline shift no extra-axial fluid collection. Vascular: No hyperdense vessel or unexpected calcification. Skull: Normal. Negative for fracture or focal lesion. Sinuses/Orbits: No acute finding. Other: There is air within the superior sagittal sinus, likely iatrogenic and introduced via IV access. CT CERVICAL SPINE FINDINGS Alignment: No acute subluxation. There is straightening of normal cervical lordosis which may be positional or due to muscle spasm. Skull base and vertebrae: No acute fracture. Soft tissues and spinal canal: No prevertebral fluid or swelling. No visible canal hematoma. Disc levels: Multilevel degenerative changes with disc space narrowing and endplate  irregularity and spurring. Upper chest: Negative. Other: Bilateral carotid bulb calcified plaques. IMPRESSION: 1. No acute intracranial pathology. 2. Moderate air within the superior sagittal sinus, likely iatrogenic and introduced via IV access. 3. No acute/traumatic cervical spine pathology. Multilevel degenerative changes. These results were called by telephone at the time of interpretation on 05/05/2021 at 6:51 pm to Dr. Aletta Edouard, who verbally acknowledged these results. Electronically Signed   By: Anner Crete M.D.   On: 05/05/2021 18:53   CT ABDOMEN PELVIS W CONTRAST  Result Date: 05/05/2021 CLINICAL DATA:  Abdominal trauma.  Fall EXAM: CT ABDOMEN AND PELVIS WITH CONTRAST TECHNIQUE: Multidetector CT imaging of the abdomen and pelvis was performed using the standard protocol following bolus administration of intravenous contrast. CONTRAST:  29mL OMNIPAQUE IOHEXOL 350 MG/ML SOLN COMPARISON:  None. FINDINGS: Lower chest: Lung bases are clear. Hepatobiliary: No focal hepatic lesion. No biliary duct dilatation. Common bile duct is normal. Pancreas: Pancreas is normal. No ductal dilatation. No pancreatic inflammation. Spleen: Normal spleen Adrenals/urinary tract: Adrenal glands normal. Kidneys are atrophic. Ureters and bladder normal. Stomach/Bowel: Stomach, small bowel, appendix, and cecum are normal. Multiple diverticula of the descending colon and sigmoid colon without acute inflammation. Vascular/Lymphatic: Abdominal aorta is normal caliber with atherosclerotic calcification. There is no retroperitoneal or periportal lymphadenopathy. No pelvic lymphadenopathy. Reproductive: Unremarkable Other: Small volume of free fluid along the margin of the  liver. Musculoskeletal: No aggressive osseous lesion.  No fracture. IMPRESSION: 1. No evidence of abdominal trauma. 2. Atrophic kidneys and small volume intraperitoneal free fluid. 3. LEFT colon diverticulosis. 4. Dense atherosclerotic disease of the aorta.  Aortic Atherosclerosis (ICD10-I70.0). Electronically Signed   By: Suzy Bouchard M.D.   On: 05/05/2021 21:55   US Abdomen Limited RUQ (LIVER/GB)  Result Date: 05/06/2021 CLINICAL DATA:  Right upper quadrant pain.  Recent fall. EXAM: ULTRASOUND ABDOMEN LIMITED RIGHT UPPER QUADRANT COMPARISON:  CT with IV contrast 05/05/2021, 12/14/2020. FINDINGS: Gallbladder: No gallstones or wall thickening visualized. No sonographic Murphy sign noted by sonographer. Common bile duct: Diameter: 4.9 mm. Liver: No focal lesion identified. Within normal limits in parenchymal echogenicity. Portal vein is patent on color Doppler imaging with normal direction of blood flow towards the liver. Other: Partial right renal atrophy with increased echogenicity of medical renal disease. Small volume of upper abdominal ascites. IMPRESSION: 1. No acute sonographic findings. 2. Unremarkable gallbladder. 3. Echogenic partially atrophic right kidney. 4. Small amount of perihepatic ascites. Electronically Signed   By: Telford Nab M.D.   On: 05/06/2021 01:04   (Echo, Carotid, EGD, Colonoscopy, ERCP)    Subjective: Patient seen and examined.  No overnight events.  Does have some pain on the right lateral chest wall.  Denies any nausea or vomiting.  No more diarrhea.  Comfortable to go home today after dialysis. He promises he will keep up with dialysis schedule and go to his center tomorrow morning.   Discharge Exam: Vitals:   05/07/21 0731 05/07/21 1146  BP: (!) 172/97 (!) 162/69  Pulse: 63 63  Resp: 17 18  Temp: 98 F (36.7 C) 97.8 F (36.6 C)  SpO2: 90% 92%   Vitals:   05/07/21 0340 05/07/21 0341 05/07/21 0731 05/07/21 1146  BP: (!) 182/92  (!) 172/97 (!) 162/69  Pulse: 60  63 63  Resp: 19  17 18   Temp: 97.8 F (36.6 C)  98 F (36.7 C) 97.8 F (36.6 C)  TempSrc: Axillary  Oral Oral  SpO2: 97%  90% 92%  Weight:  64.9 kg      General: Pt is alert, awake, not in acute distress On room air.  Looks  comfortable. Cardiovascular: RRR, S1/S2 +, no rubs, no gallops Respiratory: CTA bilaterally, no wheezing, no rhonchi Abdominal: Soft, NT, ND, bowel sounds + Extremities: no edema, no cyanosis Right upper extremity AV fistula with thrill.    The results of significant diagnostics from this hospitalization (including imaging, microbiology, ancillary and laboratory) are listed below for reference.     Microbiology: Recent Results (from the past 240 hour(s))  Resp Panel by RT-PCR (Flu A&B, Covid) Nasopharyngeal Swab     Status: None   Collection Time: 05/05/21 10:42 PM   Specimen: Nasopharyngeal Swab; Nasopharyngeal(NP) swabs in vial transport medium  Result Value Ref Range Status   SARS Coronavirus 2 by RT PCR NEGATIVE NEGATIVE Final    Comment: (NOTE) SARS-CoV-2 target nucleic acids are NOT DETECTED.  The SARS-CoV-2 RNA is generally detectable in upper respiratory specimens during the acute phase of infection. The lowest concentration of SARS-CoV-2 viral copies this assay can detect is 138 copies/mL. A negative result does not preclude SARS-Cov-2 infection and should not be used as the sole basis for treatment or other patient management decisions. A negative result may occur with  improper specimen collection/handling, submission of specimen other than nasopharyngeal swab, presence of viral mutation(s) within the areas targeted by this assay, and inadequate number of  viral copies(<138 copies/mL). A negative result must be combined with clinical observations, patient history, and epidemiological information. The expected result is Negative.  Fact Sheet for Patients:  EntrepreneurPulse.com.au  Fact Sheet for Healthcare Providers:  IncredibleEmployment.be  This test is no t yet approved or cleared by the [place] FDA and  has been authorized for detection and/or diagnosis of SARS-CoV-2 by FDA under an Emergency Use Authorization (EUA).  This EUA will remain  in effect (meaning this test can be used) for the duration of the COVID-19 declaration under Section 564(b)(1) of the Act, 21 U.S.C.section 360bbb-3(b)(1), unless the authorization is terminated  or revoked sooner.       Influenza A by PCR NEGATIVE NEGATIVE Final   Influenza B by PCR NEGATIVE NEGATIVE Final    Comment: (NOTE) The Xpert Xpress SARS-CoV-2/FLU/RSV plus assay is intended as an aid in the diagnosis of influenza from Nasopharyngeal swab specimens and should not be used as a sole basis for treatment. Nasal washings and aspirates are unacceptable for Xpert Xpress SARS-CoV-2/FLU/RSV testing.  Fact Sheet for Patients: EntrepreneurPulse.com.au  Fact Sheet for Healthcare Providers: IncredibleEmployment.be  This test is not yet approved or cleared by the [place] FDA and has been authorized for detection and/or diagnosis of SARS-CoV-2 by FDA under an Emergency Use Authorization (EUA). This EUA will remain in effect (meaning this test can be used) for the duration of the COVID-19 declaration under Section 564(b)(1) of the Act, 21 U.S.C. section 360bbb-3(b)(1), unless the authorization is terminated or revoked.  Performed at [facility], [place] [address] [place], [place] [address]      Labs: BNP (last 3 results) Recent Labs    12/14/20 2122 05/05/21 1535  BNP >4,500.0* >8,413.2*   Basic Metabolic Panel: Recent Labs  Lab 05/05/21 1534 05/06/21 0100 05/06/21 0300 05/07/21 0217  NA 139 138 139 135  K 7.0* 5.9* 6.0* 5.7*  CL 95* 95* 95* 95*  CO2 17* 20* 22 22  GLUCOSE 43* 100* 84 90  BUN 103* 107* 107* 54*  CREATININE 16.09* 16.33* 16.65* 11.01*  CALCIUM 10.1 9.4 9.4 9.5  PHOS  --   --  11.3*  --    Liver Function Tests: Recent Labs  Lab 05/05/21 1534 05/06/21 0100 05/06/21 0300 05/07/21 0217 05/07/21 0954  AST 2,428* 1,777*  --  QUANTITY NOT SUFFICIENT, UNABLE TO PERFORM TEST 544*   ALT 2,294* 2,008*  --  QUANTITY NOT SUFFICIENT, UNABLE TO PERFORM TEST 1,563*  ALKPHOS 109 104  --  120 126  BILITOT 2.1* 2.1*  --  QUANTITY NOT SUFFICIENT, UNABLE TO PERFORM TEST 2.1*  PROT 6.6 5.8*  --  5.7* 6.2*  ALBUMIN 3.6 3.2* 3.2* 3.1* 3.2*   No results for input(s): LIPASE, AMYLASE in the last 168 hours. No results for input(s): AMMONIA in the last 168 hours. CBC: Recent Labs  Lab 05/05/21 1534 05/06/21 0100 05/06/21 0300  WBC 13.0* 11.7* 11.8*  NEUTROABS 10.5*  --   --   HGB 12.0* 10.7* 11.0*  HCT 38.5* 34.1* 34.0*  MCV 101.3* 100.3* 98.0  PLT 102* 97* 94*   Cardiac Enzymes: No results for input(s): CKTOTAL, CKMB, CKMBINDEX, TROPONINI in the last 168 hours. BNP: Invalid input(s): POCBNP CBG: Recent Labs  Lab 05/06/21 0720 05/06/21 0739 05/06/21 0804 05/06/21 0833 05/06/21 1822  GLUCAP 52* 58* 98 93 111*   D-Dimer No results for input(s): DDIMER in the last 72 hours. Hgb A1c No results for input(s): HGBA1C in the last 72 hours.  Lipid Profile No results for input(s): CHOL, HDL, LDLCALC, TRIG, CHOLHDL, LDLDIRECT in the last 72 hours. Thyroid function studies No results for input(s): TSH, T4TOTAL, T3FREE, THYROIDAB in the last 72 hours.  Invalid input(s): FREET3 Anemia work up No results for input(s): VITAMINB12, FOLATE, FERRITIN, TIBC, IRON, RETICCTPCT in the last 72 hours. Urinalysis    Component Value Date/Time   COLORURINE YELLOW 12/05/2020 1339   APPEARANCEUR CLEAR 12/05/2020 1339   LABSPEC 1.016 12/05/2020 1339   PHURINE 7.0 12/05/2020 1339   GLUCOSEU 150 (A) 12/05/2020 1339   HGBUR NEGATIVE 12/05/2020 1339   BILIRUBINUR NEGATIVE 12/05/2020 1339   KETONESUR 5 (A) 12/05/2020 1339   PROTEINUR >=300 (A) 12/05/2020 1339   UROBILINOGEN 1.0 03/19/2014 1051   NITRITE NEGATIVE 12/05/2020 1339   LEUKOCYTESUR NEGATIVE 12/05/2020 1339   Sepsis Labs Invalid input(s): PROCALCITONIN,  WBC,  LACTICIDVEN Microbiology Recent Results (from the past 240  hour(s))  Resp Panel by RT-PCR (Flu A&B, Covid) Nasopharyngeal Swab     Status: None   Collection Time: 05/05/21 10:42 PM   Specimen: Nasopharyngeal Swab; Nasopharyngeal(NP) swabs in vial transport medium  Result Value Ref Range Status   SARS Coronavirus 2 by RT PCR NEGATIVE NEGATIVE Final    Comment: (NOTE) SARS-CoV-2 target nucleic acids are NOT DETECTED.  The SARS-CoV-2 RNA is generally detectable in upper respiratory specimens during the acute phase of infection. The lowest concentration of SARS-CoV-2 viral copies this assay can detect is 138 copies/mL. A negative result does not preclude SARS-Cov-2 infection and should not be used as the sole basis for treatment or other patient management decisions. A negative result may occur with  improper specimen collection/handling, submission of specimen other than nasopharyngeal swab, presence of viral mutation(s) within the areas targeted by this assay, and inadequate number of viral copies(<138 copies/mL). A negative result must be combined with clinical observations, patient history, and epidemiological information. The expected result is Negative.  Fact Sheet for Patients:  EntrepreneurPulse.com.au  Fact Sheet for Healthcare Providers:  IncredibleEmployment.be  This test is no t yet approved or cleared by the [place] FDA and  has been authorized for detection and/or diagnosis of SARS-CoV-2 by FDA under an Emergency Use Authorization (EUA). This EUA will remain  in effect (meaning this test can be used) for the duration of the COVID-19 declaration under Section 564(b)(1) of the Act, 21 U.S.C.section 360bbb-3(b)(1), unless the authorization is terminated  or revoked sooner.       Influenza A by PCR NEGATIVE NEGATIVE Final   Influenza B by PCR NEGATIVE NEGATIVE Final    Comment: (NOTE) The Xpert Xpress SARS-CoV-2/FLU/RSV plus assay is intended as an aid in the diagnosis of influenza from  Nasopharyngeal swab specimens and should not be used as a sole basis for treatment. Nasal washings and aspirates are unacceptable for Xpert Xpress SARS-CoV-2/FLU/RSV testing.  Fact Sheet for Patients: EntrepreneurPulse.com.au  Fact Sheet for Healthcare Providers: IncredibleEmployment.be  This test is not yet approved or cleared by the [place] FDA and has been authorized for detection and/or diagnosis of SARS-CoV-2 by FDA under an Emergency Use Authorization (EUA). This EUA will remain in effect (meaning this test can be used) for the duration of the COVID-19 declaration under Section 564(b)(1) of the Act, 21 U.S.C. section 360bbb-3(b)(1), unless the authorization is terminated or revoked.  Performed at [facility], [place] [address], [place], [place] [address]      Time coordinating discharge:  35 minutes  SIGNED:   Barb Merino, MD  [facility] 05/07/2021, 1:24 PM

## 2021-05-07 NOTE — Progress Notes (Signed)
Notified by RN that pt got out of bed and fell backward. Hit his head. Bed alarm was on but could not get to him before he fell. No LOC per RN. Pt is confused.  Head CT ordered

## 2021-05-07 NOTE — Progress Notes (Signed)
[facility] Progress Note   Subjective:   Patient seen and examined at bedside.  Reports feeling much better.  Denies CP, SOB, abdominal pain, weakness, dizziness and fatigue.  Admits to diarrhea for the last week and states that is why he skipped dialysis.  It is now slowly improving.  Discussed importance of compliance with dialysis and low K diet.  Patient reports understanding.   Objective Vitals:   05/06/21 2337 05/07/21 0340 05/07/21 0341 05/07/21 0731  BP: (!) 183/95 (!) 182/92  (!) 172/97  Pulse: 63 60  63  Resp: 18 19  17   Temp: 97.6 F (36.4 C) 97.8 F (36.6 C)  98 F (36.7 C)  TempSrc: Oral Axillary  Oral  SpO2: 97% 97%  90%  Weight:   64.9 kg    Physical Exam General:ill appearing male in NAD Heart:RRR, no mrg Lungs:CTAB, nml WOB Abdomen:soft, NTND Extremities:no LE edema Dialysis Access: RU AVG +b/t   Filed Weights   05/06/21 0831 05/07/21 0341  Weight: 74.5 kg 64.9 kg    Intake/Output Summary (Last 24 hours) at 05/07/2021 1140 Last data filed at 05/07/2021 0851 Gross per 24 hour  Intake 360 ml  Output --  Net 360 ml    Additional Objective Labs: Basic Metabolic Panel: Recent Labs  Lab 05/06/21 0100 05/06/21 0300 05/07/21 0217  NA 138 139 135  K 5.9* 6.0* 5.7*  CL 95* 95* 95*  CO2 20* 22 22  GLUCOSE 100* 84 90  BUN 107* 107* 54*  CREATININE 16.33* 16.65* 11.01*  CALCIUM 9.4 9.4 9.5  PHOS  --  11.3*  --    Liver Function Tests: Recent Labs  Lab 05/06/21 0100 05/06/21 0300 05/07/21 0217 05/07/21 0954  AST 1,777*  --  QUANTITY NOT SUFFICIENT, UNABLE TO PERFORM TEST 544*  ALT 2,008*  --  QUANTITY NOT SUFFICIENT, UNABLE TO PERFORM TEST 1,563*  ALKPHOS 104  --  120 126  BILITOT 2.1*  --  QUANTITY NOT SUFFICIENT, UNABLE TO PERFORM TEST 2.1*  PROT 5.8*  --  5.7* 6.2*  ALBUMIN 3.2* 3.2* 3.1* 3.2*    CBC: Recent Labs  Lab 05/05/21 1534 05/06/21 0100 05/06/21 0300  WBC 13.0* 11.7* 11.8*  NEUTROABS 10.5*  --   --   HGB  12.0* 10.7* 11.0*  HCT 38.5* 34.1* 34.0*  MCV 101.3* 100.3* 98.0  PLT 102* 97* 94*   CBG: Recent Labs  Lab 05/06/21 0720 05/06/21 0739 05/06/21 0804 05/06/21 0833 05/06/21 1822  GLUCAP 52* 58* 98 93 111*   Studies/Results: DG Chest 2 View  Result Date: 05/05/2021 CLINICAL DATA:  Weakness, fell last night EXAM: CHEST - 2 VIEW COMPARISON:  12/14/2020 FINDINGS: Enlargement of cardiac silhouette. Atherosclerotic calcification aorta. Slightly prominent pulmonary vascular structures at the hila unchanged. Lungs clear. No pulmonary infiltrate, pleural effusion, or pneumothorax. Diffuse osseous demineralization. No definite fractures identified. IMPRESSION: Enlargement of cardiac silhouette. No acute abnormalities. Electronically Signed   By: Lavonia Dana M.D.   On: 05/05/2021 16:34   DG Thoracic Spine 2 View  Result Date: 05/05/2021 CLINICAL DATA:  Back pain post fall last night EXAM: THORACIC SPINE 2 VIEWS COMPARISON:  None FINDINGS: Vertebral body and disc space heights maintained. No fracture, subluxation, or bone destruction. Atherosclerotic calcifications aorta. IMPRESSION: No acute osseous abnormalities. Aortic Atherosclerosis (ICD10-I70.0). Electronically Signed   By: Lavonia Dana M.D.   On: 05/05/2021 16:35   CT Head Wo Contrast  Result Date: 05/05/2021 CLINICAL DATA:  Trauma. EXAM: CT HEAD WITHOUT CONTRAST CT  CERVICAL SPINE WITHOUT CONTRAST TECHNIQUE: Multidetector CT imaging of the head and cervical spine was performed following the standard protocol without intravenous contrast. Multiplanar CT image reconstructions of the cervical spine were also generated. COMPARISON:  None. FINDINGS: CT HEAD FINDINGS Brain: The ventricles and sulci appropriate size for patient's age. 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 There is no acute intracranial hemorrhage. No mass effect or midline shift no extra-axial fluid collection.  Vascular: No hyperdense vessel or unexpected calcification. Skull: Normal. Negative for fracture or focal lesion. Sinuses/Orbits: No acute finding. Other: There is air within the superior sagittal sinus, likely iatrogenic and introduced via IV access. CT CERVICAL SPINE FINDINGS Alignment: No acute subluxation. There is straightening of normal cervical lordosis which may be positional or due to muscle spasm. Skull base and vertebrae: No acute fracture. Soft tissues and spinal canal: No prevertebral fluid or swelling. No visible canal hematoma. Disc levels: Multilevel degenerative changes with disc space narrowing and endplate irregularity and spurring. Upper chest: Negative. Other: Bilateral carotid bulb calcified plaques. IMPRESSION: 1. No acute intracranial pathology. 2. Moderate air within the superior sagittal sinus, likely iatrogenic and introduced via IV access. 3. No acute/traumatic cervical spine pathology. Multilevel degenerative changes. These results were called by telephone at the time of interpretation on 05/05/2021 at 6:51 pm to Dr. Aletta Edouard, who verbally acknowledged these results. Electronically Signed   By: Anner Crete M.D.   On: 05/05/2021 18:53   CT Cervical Spine Wo Contrast  Result Date: 05/05/2021 CLINICAL DATA:  Trauma. EXAM: CT HEAD WITHOUT CONTRAST CT CERVICAL SPINE WITHOUT CONTRAST TECHNIQUE: Multidetector CT imaging of the head and cervical spine was performed following the standard protocol without intravenous contrast. Multiplanar CT image reconstructions of the cervical spine were also generated. COMPARISON:  None. FINDINGS: CT HEAD FINDINGS Brain: The ventricles and sulci appropriate size for patient's age. 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 There is no acute intracranial hemorrhage. No mass effect or midline shift no extra-axial fluid collection. Vascular: No hyperdense vessel or unexpected  calcification. Skull: Normal. Negative for fracture or focal lesion. Sinuses/Orbits: No acute finding. Other: There is air within the superior sagittal sinus, likely iatrogenic and introduced via IV access. CT CERVICAL SPINE FINDINGS Alignment: No acute subluxation. There is straightening of normal cervical lordosis which may be positional or due to muscle spasm. Skull base and vertebrae: No acute fracture. Soft tissues and spinal canal: No prevertebral fluid or swelling. No visible canal hematoma. Disc levels: Multilevel degenerative changes with disc space narrowing and endplate irregularity and spurring. Upper chest: Negative. Other: Bilateral carotid bulb calcified plaques. IMPRESSION: 1. No acute intracranial pathology. 2. Moderate air within the superior sagittal sinus, likely iatrogenic and introduced via IV access. 3. No acute/traumatic cervical spine pathology. Multilevel degenerative changes. These results were called by telephone at the time of interpretation on 05/05/2021 at 6:51 pm to Dr. Aletta Edouard, who verbally acknowledged these results. Electronically Signed   By: Anner Crete M.D.   On: 05/05/2021 18:53   CT ABDOMEN PELVIS W CONTRAST  Result Date: 05/05/2021 CLINICAL DATA:  Abdominal trauma.  Fall EXAM: CT ABDOMEN AND PELVIS WITH CONTRAST TECHNIQUE: Multidetector CT imaging of the abdomen and pelvis was performed using the standard protocol following bolus administration of intravenous contrast. CONTRAST:  63mL OMNIPAQUE IOHEXOL 350 MG/ML SOLN COMPARISON:  None. FINDINGS: Lower chest: Lung bases are  clear. Hepatobiliary: No focal hepatic lesion. No biliary duct dilatation. Common bile duct is normal. Pancreas: Pancreas is normal. No ductal dilatation. No pancreatic inflammation. Spleen: Normal spleen Adrenals/urinary tract: Adrenal glands normal. Kidneys are atrophic. Ureters and bladder normal. Stomach/Bowel: Stomach, small bowel, appendix, and cecum are normal. Multiple diverticula  of the descending colon and sigmoid colon without acute inflammation. Vascular/Lymphatic: Abdominal aorta is normal caliber with atherosclerotic calcification. There is no retroperitoneal or periportal lymphadenopathy. No pelvic lymphadenopathy. Reproductive: Unremarkable Other: Small volume of free fluid along the margin of the liver. Musculoskeletal: No aggressive osseous lesion.  No fracture. IMPRESSION: 1. No evidence of abdominal trauma. 2. Atrophic kidneys and small volume intraperitoneal free fluid. 3. LEFT colon diverticulosis. 4. Dense atherosclerotic disease of the aorta. Aortic Atherosclerosis (ICD10-I70.0). Electronically Signed   By: Suzy Bouchard M.D.   On: 05/05/2021 21:55   US Abdomen Limited RUQ (LIVER/GB)  Result Date: 05/06/2021 CLINICAL DATA:  Right upper quadrant pain.  Recent fall. EXAM: ULTRASOUND ABDOMEN LIMITED RIGHT UPPER QUADRANT COMPARISON:  CT with IV contrast 05/05/2021, 12/14/2020. FINDINGS: Gallbladder: No gallstones or wall thickening visualized. No sonographic Murphy sign noted by sonographer. Common bile duct: Diameter: 4.9 mm. Liver: No focal lesion identified. Within normal limits in parenchymal echogenicity. Portal vein is patent on color Doppler imaging with normal direction of blood flow towards the liver. Other: Partial right renal atrophy with increased echogenicity of medical renal disease. Small volume of upper abdominal ascites. IMPRESSION: 1. No acute sonographic findings. 2. Unremarkable gallbladder. 3. Echogenic partially atrophic right kidney. 4. Small amount of perihepatic ascites. Electronically Signed   By: Telford Nab M.D.   On: 05/06/2021 01:04    Medications:  sodium chloride     sodium chloride      calcium carbonate  1 tablet Oral TID WC   Chlorhexidine Gluconate Cloth  6 each Topical Q0600   heparin  5,000 Units Subcutaneous Q8H   lidocaine  1 patch Transdermal Q24H   metoprolol tartrate  25 mg Oral BID   sodium chloride flush  3 mL  Intravenous Q12H    Dialysis Orders: Center: [facility]  on TTS . EDW 70kg HD Bath 2K/2Ca  Time 4 hours Heparin none. Access R forearm AVG BFR 350 DFR 800    Micera 100 mcg every 2 weeks   Assessment/Plan:  Hyperkalemia - due to noncompliance with dialysis.  Given IV Ca, insulin/D50, and bicarb.  HD yesterday.  K improved to 5.7 this AM.  Given lokelma. Plan for additional HD today. Hypoglycemia - Improved.  ESRD -  chronic non compliance worsened over the last month.  On TTS schedule.  Extra HD ordered for today and again tomorrow per regular schedule.  Hypertension/volume  - Blood pressure improved post HD, and elevated again this AM.  Does not appear grossly volume overloaded.  CXR with no acute abnormalities. Continue UF as tolerated.   Anemia  - Hgb 11.0. no indication for ESA.  Metabolic bone disease -  Phos elevated, continue calcium acetate with meals. May need to change to non calcium based binder. Last pth 80, hold vit D due to elevated Calcium/suppressed pth.    Nutrition -  renal diet w/fluid restrictions Right flank pain - s/p fall.  No acute findings on imaging.  Elevated AST and ALT - unclear etiology.  trending down. abd CT with no acute abnormalities.    Jen Mow, PA-C [facility] 05/07/2021,11:40 AM  LOS: 2 days

## 2021-05-07 NOTE — TOC Transition Note (Addendum)
Transition of Care [facility]) - CM/SW Discharge Note   Patient Details  Name: Albert Harris MRN: [id number] Date of Birth: 04/27/1956  [facility] of [facility] CM/SW Contact:  Cyndi Bender, RN Phone Number: 05/07/2021, 2:19 PM   Clinical Narrative:    Patient stable for discharge.  Orders for HH-PT. Wife defers to [facility] to find agency. Spoke to [employer] with [facility] and referral accepted.  Wife states patient's son will transport him home after dialysis and helps take patient to appointments.  Address, Phone number and PCP verified.    Final next level of care: [place] Barriers to Discharge: Barriers Resolved   Patient Goals and CMS Choice Patient states their goals for this hospitalization and ongoing recovery are:: return home CMS Medicare.gov Compare Post Acute Care list provided to:: Patient Choice offered to / list presented to : Patient, Spouse  Discharge Placement               home        Discharge Plan and Services                DME Arranged:  (patient has walker)         HH Arranged: PT HH Agency: [facility] Date [place]: 05/07/21 Time [place]: [place] Representative spoke with at [employer]: [place] ([place]) Interventions     Readmission Risk Interventions Readmission Risk Prevention Plan 12/19/2020  Transportation Screening Complete  PCP or Specialist Appt within 3-5 Days Complete  HRI or [place] Complete  Social Work Consult for [place] Planning/Counseling Complete  Palliative Care Screening Complete  Medication Review [employer]) Complete  Some recent data might be hidden

## 2021-05-08 ENCOUNTER — Inpatient Hospital Stay (HOSPITAL_COMMUNITY): Payer: Medicare Other

## 2021-05-08 ENCOUNTER — Other Ambulatory Visit: Payer: Self-pay

## 2021-05-08 DIAGNOSIS — E875 Hyperkalemia: Secondary | ICD-10-CM | POA: Diagnosis not present

## 2021-05-08 LAB — CBC
HCT: 39.8 % (ref 39.0–52.0)
HCT: 58.6 % — ABNORMAL HIGH (ref 39.0–52.0)
Hemoglobin: 12.7 g/dL — ABNORMAL LOW (ref 13.0–17.0)
Hemoglobin: 18.3 g/dL — ABNORMAL HIGH (ref 13.0–17.0)
MCH: 30.9 pg (ref 26.0–34.0)
MCH: 30.8 pg (ref 26.0–34.0)
MCHC: 31.9 g/dL (ref 30.0–36.0)
MCHC: 31.2 g/dL (ref 30.0–36.0)
MCV: 96.8 fL (ref 80.0–100.0)
MCV: 98.5 fL (ref 80.0–100.0)
Platelets: 80 10*3/uL — ABNORMAL LOW (ref 150–400)
Platelets: 67 10*3/uL — ABNORMAL LOW (ref 150–400)
RBC: 4.11 MIL/uL — ABNORMAL LOW (ref 4.22–5.81)
RBC: 5.95 MIL/uL — ABNORMAL HIGH (ref 4.22–5.81)
RDW: 15.9 % — ABNORMAL HIGH (ref 11.5–15.5)
RDW: 17.9 % — ABNORMAL HIGH (ref 11.5–15.5)
WBC: 7.8 10*3/uL (ref 4.0–10.5)
WBC: 7.2 10*3/uL (ref 4.0–10.5)
nRBC: 0.5 % — ABNORMAL HIGH (ref 0.0–0.2)
nRBC: 0.4 % — ABNORMAL HIGH (ref 0.0–0.2)

## 2021-05-08 LAB — COMPREHENSIVE METABOLIC PANEL
ALT: 1356 U/L — ABNORMAL HIGH (ref 0–44)
AST: 462 U/L — ABNORMAL HIGH (ref 15–41)
Albumin: 3.1 g/dL — ABNORMAL LOW (ref 3.5–5.0)
Alkaline Phosphatase: 119 U/L (ref 38–126)
Anion gap: 20 — ABNORMAL HIGH (ref 5–15)
BUN: 30 mg/dL — ABNORMAL HIGH (ref 8–23)
CO2: 19 mmol/L — ABNORMAL LOW (ref 22–32)
Calcium: 8.6 mg/dL — ABNORMAL LOW (ref 8.9–10.3)
Chloride: 96 mmol/L — ABNORMAL LOW (ref 98–111)
Creatinine, Ser: 6.59 mg/dL — ABNORMAL HIGH (ref 0.61–1.24)
GFR, Estimated: 9 mL/min — ABNORMAL LOW (ref >60)
Glucose, Bld: 76 mg/dL (ref 70–99)
Potassium: 5.3 mmol/L — ABNORMAL HIGH (ref 3.5–5.1)
Sodium: 135 mmol/L (ref 135–145)
Total Bilirubin: 2.8 mg/dL — ABNORMAL HIGH (ref 0.3–1.2)
Total Protein: 5.8 g/dL — ABNORMAL LOW (ref 6.5–8.1)

## 2021-05-08 LAB — BLOOD GAS, ARTERIAL
Acid-base deficit: 1.2 mmol/L (ref 0.0–2.0)
Bicarbonate: 22.8 mmol/L (ref 20.0–28.0)
Drawn by: 535471
FIO2: 21
O2 Saturation: 91.1 %
Patient temperature: 36.7
pCO2 arterial: 36.4 mmHg (ref 32.0–48.0)
pH, Arterial: 7.413 (ref 7.350–7.450)
pO2, Arterial: 69 mmHg — ABNORMAL LOW (ref 83.0–108.0)

## 2021-05-08 LAB — AMMONIA: Ammonia: 36 umol/L — ABNORMAL HIGH (ref 9–35)

## 2021-05-08 MED ORDER — PENTAFLUOROPROP-TETRAFLUOROETH EX AERO: 1.0000 "application " | INHALATION_SPRAY | CUTANEOUS | Status: DC | PRN

## 2021-05-08 MED ORDER — LORAZEPAM 2 MG/ML IJ SOLN
1.0000 mg | Freq: Once | INTRAMUSCULAR | Status: AC
  Administered 2021-05-08: 1 mg via INTRAVENOUS
  Filled 2021-05-08: qty 1

## 2021-05-08 MED ORDER — LORAZEPAM 2 MG/ML IJ SOLN: 1.0000 mg | Freq: Once | INTRAMUSCULAR | Status: DC

## 2021-05-08 MED ORDER — SODIUM CHLORIDE 0.9 % IV SOLN: 100.0000 mL | INTRAVENOUS | Status: DC | PRN

## 2021-05-08 MED ORDER — HALOPERIDOL 5 MG PO TABS
5.0000 mg | ORAL_TABLET | Freq: Four times a day (QID) | ORAL | Status: DC | PRN
  Filled 2021-05-08: qty 1

## 2021-05-08 MED ORDER — HALOPERIDOL LACTATE 5 MG/ML IJ SOLN: 2.0000 mg | Freq: Four times a day (QID) | INTRAMUSCULAR | Status: DC | PRN

## 2021-05-08 MED ORDER — FAMOTIDINE 20 MG PO TABS
20.0000 mg | ORAL_TABLET | Freq: Every day | ORAL | Status: DC
  Administered 2021-05-09 – 2021-05-13 (×5): 20 mg via ORAL
  Filled 2021-05-08 (×5): qty 1

## 2021-05-08 MED ORDER — HEPARIN SODIUM (PORCINE) 1000 UNIT/ML DIALYSIS: 1000.0000 [IU] | INTRAMUSCULAR | Status: DC | PRN

## 2021-05-08 MED ORDER — LIDOCAINE-PRILOCAINE 2.5-2.5 % EX CREA
1.0000 "application " | TOPICAL_CREAM | CUTANEOUS | Status: DC | PRN
  Filled 2021-05-08: qty 5

## 2021-05-08 MED ORDER — ALTEPLASE 2 MG IJ SOLR: 2.0000 mg | Freq: Once | INTRAMUSCULAR | Status: DC | PRN

## 2021-05-08 MED ORDER — LIDOCAINE HCL (PF) 1 % IJ SOLN
5.0000 mL | INTRAMUSCULAR | Status: DC | PRN
  Filled 2021-05-08: qty 5

## 2021-05-08 MED ORDER — LIDOCAINE-PRILOCAINE 2.5-2.5 % EX CREA: 1.0000 "application " | TOPICAL_CREAM | CUTANEOUS | Status: DC | PRN

## 2021-05-08 MED ORDER — LABETALOL HCL 5 MG/ML IV SOLN
20.0000 mg | INTRAVENOUS | Status: DC | PRN
  Administered 2021-05-08 – 2021-05-09 (×2): 20 mg via INTRAVENOUS
  Filled 2021-05-08 (×3): qty 4

## 2021-05-08 MED ORDER — LABETALOL HCL 5 MG/ML IV SOLN
20.0000 mg | Freq: Once | INTRAVENOUS | Status: DC | PRN
  Filled 2021-05-08: qty 4

## 2021-05-08 MED ORDER — LIDOCAINE HCL (PF) 1 % IJ SOLN: 5.0000 mL | INTRAMUSCULAR | Status: DC | PRN

## 2021-05-08 MED ORDER — CHLORHEXIDINE GLUCONATE CLOTH 2 % EX PADS
6.0000 | MEDICATED_PAD | Freq: Every day | CUTANEOUS | Status: DC
  Administered 2021-05-08 – 2021-05-13 (×5): 6 via TOPICAL

## 2021-05-08 NOTE — Progress Notes (Signed)
Hypertension during dialysis discussed with physician assistant and hospitalist at bedside. As needed bp medications ordered. Awaiting pharmacy approval

## 2021-05-08 NOTE — Care Management Important Message (Signed)
Important Message  Patient Details  Name: Albert Harris MRN: [id number] Date of Birth: 1955/06/25   Medicare Important Message Given:  Yes     Orbie Pyo 05/08/2021, 2:07 PM

## 2021-05-08 NOTE — Progress Notes (Signed)
PROGRESS NOTE    Albert Harris  JKK:[id number] DOB: Mar 15, 1956 DOA: 05/05/2021 PCP: Kerin Perna, NP    Brief Narrative:  65 year old gentleman with history of ESRD on hemodialysis, COPD, peripheral vascular disease, hypertension, hyperlipidemia and severe noncompliance to dialysis regimen presented with fall at home.  Not going to hemodialysis since last 1 week.  Not eating well since Thanksgiving as per family.  In the emergency room hemodynamically stable.  Potassium 7. AST and ALT 2428/2294 with bilirubin of 2.  Urgently hemodialyzed.  Skeletal survey negative.  Patient has clinically improved and prepared to discharge after hemodialysis on 11/30.  He became very confused and combative so discharge was canceled.  Patient remained encephalopathic overnight with no obvious known cause.   Assessment & Plan:   Principal Problem:   Hyperkalemia Active Problems:   ESRD (end stage renal disease) (HCC)   Hypertension   COPD (chronic obstructive pulmonary disease) (HCC)   Elevated LFTs   Hypoglycemia   Thrombocytopenia (HCC)   Fall at home, initial encounter  Acute metabolic encephalopathy: Developed in the hospital.  Primary cause unknown. -Patient without history of alcohol intake or drug use.  UDS was negative. -CT head was normal.  Will attempt MRI if possible due to new onset confusion and delirium. -Ammonia, salicylate, Tylenol levels normal.  Blood gases essentially normal. -LFTs are already improving and downtrending. -Check blood cultures today.  We will attempt MRI of the brain. -All-time fall precautions and delirium precautions.  Will use atypical antipsychotics and avoid using benzodiazepines.  Continue mobility.  Safety sitter if needed to keep him safe.  ESRD on hemodialysis, missed hemodialysis resulting in hyperkalemia: Treated with temporizing measures with calcium gluconate, insulin and dextrose infusion as well bicarbonate. Patient underwent third  consecutive session of hemodialysis today.  Potassium improved.   Elevated LFTs: Consistent with shock liver.  CT scan of the abdomen pelvis with no obvious etiology or abnormalities. Hepatitis panel negative. Tylenol and salicylate levels are normal. Right upper quadrant ultrasound is essentially normal with unremarkable gallbladder and bile duct. Continue monitoring to ensure stabilization.   Hypoglycemia: Due to poor oral intake and insulin use per correction of hyperkalemia.  Regular diet.  Blood sugars stabilized after eating regular diet.  COPD: Stable.  Albuterol as needed.   Fall: Patient has right lateral rib pain and tenderness that is reproducible.  Skeletal survey negative.  Local lidocaine patch.  DVT prophylaxis: heparin injection 5,000 Units Start: 05/06/21 0000   Code Status: Full code Family Communication: Wife on the phone. Disposition Plan: Status is: Inpatient  Remains inpatient appropriate because: Altered mental status         Consultants:  Nephrology  Procedures:  Hemodialysis  Antimicrobials:  None   Subjective: Patient seen and examined.  When I examined him in the morning he was delirious and unable to participate in interview.  He was confused and kicking on his breakfast tray. Overnight events noted.  Patient was normal before going to dialysis yesterday, he was delirious and confused after coming back from dialysis so discharge was canceled.  He received 1 dose of Ativan today morning because of uncontrolled symptoms. Called and discussed with patient's wife and she denies any use of substances or alcohol.  Do not suspect any withdrawals.  Objective: Vitals:   05/08/21 0430 05/08/21 0755 05/08/21 1203 05/08/21 1231  BP:  (!) 185/101 (!) 208/117 (!) 206/117  Pulse:  70 73 73  Resp:  18 (!) 25 (!) 25  Temp:  98.1 F (36.7 C) (!) 96.8 F (36 C)   TempSrc:  Oral Axillary   SpO2:  97% 100% 100%  Weight: 65.3 kg  62.2 kg     Intake/Output  Summary (Last 24 hours) at 05/08/2021 1332 Last data filed at 05/07/2021 2015 Gross per 24 hour  Intake 120 ml  Output 2000 ml  Net -1880 ml   Filed Weights   05/07/21 1830 05/08/21 0430 05/08/21 1203  Weight: 65 kg 65.3 kg 62.2 kg    Examination:  General exam: Appears anxious and tremulous. Respiratory system: Clear to auscultation. Respiratory effort normal.  No added sounds. Cardiovascular system: S1 & S2 heard, RRR.  No edema. Gastrointestinal system: Abdomen is nondistended, soft and nontender. No organomegaly or masses felt. Normal bowel sounds heard. Central nervous system: Alert but disoriented.  Unable to follow commands.  Uses all 4 extremities.  Keeps eyes closed.    Data Reviewed: I have personally reviewed following labs and imaging studies  CBC: Recent Labs  Lab 05/05/21 1534 05/06/21 0100 05/06/21 0300  WBC 13.0* 11.7* 11.8*  NEUTROABS 10.5*  --   --   HGB 12.0* 10.7* 11.0*  HCT 38.5* 34.1* 34.0*  MCV 101.3* 100.3* 98.0  PLT 102* 97* 94*   Basic Metabolic Panel: Recent Labs  Lab 05/05/21 1534 05/06/21 0100 05/06/21 0300 05/07/21 0217 05/08/21 0241  NA 139 138 139 135 135  K 7.0* 5.9* 6.0* 5.7* 5.3*  CL 95* 95* 95* 95* 96*  CO2 17* 20* 22 22 19*  GLUCOSE 43* 100* 84 90 76  BUN 103* 107* 107* 54* 30*  CREATININE 16.09* 16.33* 16.65* 11.01* 6.59*  CALCIUM 10.1 9.4 9.4 9.5 8.6*  PHOS  --   --  11.3*  --   --    GFR: Estimated Creatinine Clearance: 9.7 mL/min (A) (by C-G formula based on SCr of 6.59 mg/dL (H)). Liver Function Tests: Recent Labs  Lab 05/05/21 1534 05/06/21 0100 05/06/21 0300 05/07/21 0217 05/07/21 0954 05/08/21 0241  AST 2,428* 1,777*  --  QUANTITY NOT SUFFICIENT, UNABLE TO PERFORM TEST 544* 462*  ALT 2,294* 2,008*  --  QUANTITY NOT SUFFICIENT, UNABLE TO PERFORM TEST 1,563* 1,356*  ALKPHOS 109 104  --  120 126 119  BILITOT 2.1* 2.1*  --  QUANTITY NOT SUFFICIENT, UNABLE TO PERFORM TEST 2.1* 2.8*  PROT 6.6 5.8*  --  5.7*  6.2* 5.8*  ALBUMIN 3.6 3.2* 3.2* 3.1* 3.2* 3.1*   No results for input(s): LIPASE, AMYLASE in the last 168 hours. Recent Labs  Lab 05/08/21 1020  AMMONIA 36*   Coagulation Profile: Recent Labs  Lab 05/06/21 0100  INR 2.8*   Cardiac Enzymes: No results for input(s): CKTOTAL, CKMB, CKMBINDEX, TROPONINI in the last 168 hours. BNP (last 3 results) No results for input(s): PROBNP in the last 8760 hours. HbA1C: No results for input(s): HGBA1C in the last 72 hours. CBG: Recent Labs  Lab 05/06/21 0739 05/06/21 0804 05/06/21 0833 05/06/21 1822 05/07/21 1842  GLUCAP 58* 98 93 111* 85   Lipid Profile: No results for input(s): CHOL, HDL, LDLCALC, TRIG, CHOLHDL, LDLDIRECT in the last 72 hours. Thyroid Function Tests: No results for input(s): TSH, T4TOTAL, FREET4, T3FREE, THYROIDAB in the last 72 hours. Anemia Panel: No results for input(s): VITAMINB12, FOLATE, FERRITIN, TIBC, IRON, RETICCTPCT in the last 72 hours. Sepsis Labs: No results for input(s): PROCALCITON, LATICACIDVEN in the last 168 hours.  Recent Results (from the past 240 hour(s))  Resp Panel by RT-PCR (Flu A&B, Covid)  Nasopharyngeal Swab     Status: None   Collection Time: 05/05/21 10:42 PM   Specimen: Nasopharyngeal Swab; Nasopharyngeal(NP) swabs in vial transport medium  Result Value Ref Range Status   SARS Coronavirus 2 by RT PCR NEGATIVE NEGATIVE Final    Comment: (NOTE) SARS-CoV-2 target nucleic acids are NOT DETECTED.  The SARS-CoV-2 RNA is generally detectable in upper respiratory specimens during the acute phase of infection. The lowest concentration of SARS-CoV-2 viral copies this assay can detect is 138 copies/mL. A negative result does not preclude SARS-Cov-2 infection and should not be used as the sole basis for treatment or other patient management decisions. A negative result may occur with  improper specimen collection/handling, submission of specimen other than nasopharyngeal swab, presence of  viral mutation(s) within the areas targeted by this assay, and inadequate number of viral copies(<138 copies/mL). A negative result must be combined with clinical observations, patient history, and epidemiological information. The expected result is Negative.  Fact Sheet for Patients:  EntrepreneurPulse.com.au  Fact Sheet for Healthcare Providers:  IncredibleEmployment.be  This test is no t yet approved or cleared by the [place] FDA and  has been authorized for detection and/or diagnosis of SARS-CoV-2 by FDA under an Emergency Use Authorization (EUA). This EUA will remain  in effect (meaning this test can be used) for the duration of the COVID-19 declaration under Section 564(b)(1) of the Act, 21 U.S.C.section 360bbb-3(b)(1), unless the authorization is terminated  or revoked sooner.       Influenza A by PCR NEGATIVE NEGATIVE Final   Influenza B by PCR NEGATIVE NEGATIVE Final    Comment: (NOTE) The Xpert Xpress SARS-CoV-2/FLU/RSV plus assay is intended as an aid in the diagnosis of influenza from Nasopharyngeal swab specimens and should not be used as a sole basis for treatment. Nasal washings and aspirates are unacceptable for Xpert Xpress SARS-CoV-2/FLU/RSV testing.  Fact Sheet for Patients: EntrepreneurPulse.com.au  Fact Sheet for Healthcare Providers: IncredibleEmployment.be  This test is not yet approved or cleared by the [place] FDA and has been authorized for detection and/or diagnosis of SARS-CoV-2 by FDA under an Emergency Use Authorization (EUA). This EUA will remain in effect (meaning this test can be used) for the duration of the COVID-19 declaration under Section 564(b)(1) of the Act, 21 U.S.C. section 360bbb-3(b)(1), unless the authorization is terminated or revoked.  Performed at [facility], [place] [address] [place],  [address]          Radiology  Studies: CT HEAD WO CONTRAST (5MM)  Result Date: 05/08/2021 CLINICAL DATA:  65 year old male status post fall. End stage renal disease. EXAM: CT HEAD WITHOUT CONTRAST TECHNIQUE: Contiguous axial images were obtained from the base of the skull through the vertex without intravenous contrast. COMPARISON:  Head CT 05/05/2021.  Brain MRI 11/10/2017. FINDINGS: Brain: Chronic lacunar infarct anterior right external capsule. Stable cerebral volume. No midline shift, ventriculomegaly, mass effect, evidence of mass lesion, intracranial hemorrhage or evidence of cortically based acute infarction. Stable gray-white matter differentiation throughout the brain. Vascular: Extensive Calcified atherosclerosis at the skull base. Resolved gas previously seen within the superior sagittal sinus, and to a lesser extent cavernous sinus and facial veins. Calcified MCA atherosclerosis is apparent. No suspicious intracranial vascular hyperdensity. Skull: Stable, intact. Sinuses/Orbits: Visualized paranasal sinuses and mastoids are clear. Other: No orbit or scalp soft tissue injury identified. IMPRESSION: 1. No acute intracranial abnormality or recent traumatic injury identified. 2. Extensive 6intracranial atherosclerosis. Chronic right external capsule lacunar infarct. 3. Resolved intravenous  gas, likely was IV access related. Electronically Signed   By: Genevie Ann M.D.   On: 05/08/2021 08:06        Scheduled Meds:  calcium carbonate  1 tablet Oral TID WC   Chlorhexidine Gluconate Cloth  6 each Topical Q0600   famotidine  20 mg Oral BID   heparin  5,000 Units Subcutaneous Q8H   lidocaine  1 patch Transdermal Q24H   LORazepam  1 mg Intravenous Once   metoprolol tartrate  25 mg Oral BID   sodium chloride flush  3 mL Intravenous Q12H   Continuous Infusions:  sodium chloride     sodium chloride       LOS: 3 days    Time spent: 30 minutes    Barb Merino, MD [facility] Pager [phone]

## 2021-05-08 NOTE — Progress Notes (Signed)
[facility] Progress Note   Subjective:   Patient seen and examined at bedside in dialysis.  Supposed to d/c home yesterday but ended up staying overnight due to becoming confused and combative.  Unfortunately fell and hit his head during the night but no acute findings on CT.  Today he remains confused.  Does not remember falling.  Will acknowledge questions but does not answer or follow commands.    Objective Vitals:   05/08/21 1300 05/08/21 1330 05/08/21 1400 05/08/21 1430  BP: (!) 198/90 (!) 193/98 (!) 179/87 (!) 195/103  Pulse: 67 71  69  Resp:   (!) 21 20  Temp:      TempSrc:      SpO2:      Weight:       Physical Exam General:chronically ill appearing, confused male in NAD Heart:RRR, no mrg Lungs:CTAB anteriorly  Abdomen:soft, NTND Extremities:no LE edema Dialysis Access: RU AVF in use   Filed Weights   05/07/21 1830 05/08/21 0430 05/08/21 1203  Weight: 65 kg 65.3 kg 62.2 kg    Intake/Output Summary (Last 24 hours) at 05/08/2021 1453 Last data filed at 05/07/2021 2015 Gross per 24 hour  Intake 120 ml  Output 2000 ml  Net -1880 ml    Additional Objective Labs: Basic Metabolic Panel: Recent Labs  Lab 05/06/21 0300 05/07/21 0217 05/08/21 0241  NA 139 135 135  K 6.0* 5.7* 5.3*  CL 95* 95* 96*  CO2 22 22 19*  GLUCOSE 84 90 76  BUN 107* 54* 30*  CREATININE 16.65* 11.01* 6.59*  CALCIUM 9.4 9.5 8.6*  PHOS 11.3*  --   --    Liver Function Tests: Recent Labs  Lab 05/07/21 0217 05/07/21 0954 05/08/21 0241  AST QUANTITY NOT SUFFICIENT, UNABLE TO PERFORM TEST 544* 462*  ALT QUANTITY NOT SUFFICIENT, UNABLE TO PERFORM TEST 1,563* 1,356*  ALKPHOS 120 126 119  BILITOT QUANTITY NOT SUFFICIENT, UNABLE TO PERFORM TEST 2.1* 2.8*  PROT 5.7* 6.2* 5.8*  ALBUMIN 3.1* 3.2* 3.1*   CBC: Recent Labs  Lab 05/05/21 1534 05/06/21 0100 05/06/21 0300  WBC 13.0* 11.7* 11.8*  NEUTROABS 10.5*  --   --   HGB 12.0* 10.7* 11.0*  HCT 38.5* 34.1* 34.0*  MCV  101.3* 100.3* 98.0  PLT 102* 97* 94*   CBG: Recent Labs  Lab 05/06/21 0739 05/06/21 0804 05/06/21 0833 05/06/21 1822 05/07/21 1842  GLUCAP 58* 98 93 111* 85    Studies/Results: CT HEAD WO CONTRAST (5MM)  Result Date: 05/08/2021 CLINICAL DATA:  65 year old male status post fall. End stage renal disease. EXAM: CT HEAD WITHOUT CONTRAST TECHNIQUE: Contiguous axial images were obtained from the base of the skull through the vertex without intravenous contrast. COMPARISON:  Head CT 05/05/2021.  Brain MRI 11/10/2017. FINDINGS: Brain: Chronic lacunar infarct anterior right external capsule. Stable cerebral volume. No midline shift, ventriculomegaly, mass effect, evidence of mass lesion, intracranial hemorrhage or evidence of cortically based acute infarction. Stable gray-white matter differentiation throughout the brain. Vascular: Extensive Calcified atherosclerosis at the skull base. Resolved gas previously seen within the superior sagittal sinus, and to a lesser extent cavernous sinus and facial veins. Calcified MCA atherosclerosis is apparent. No suspicious intracranial vascular hyperdensity. Skull: Stable, intact. Sinuses/Orbits: Visualized paranasal sinuses and mastoids are clear. Other: No orbit or scalp soft tissue injury identified. IMPRESSION: 1. No acute intracranial abnormality or recent traumatic injury identified. 2. Extensive 6intracranial atherosclerosis. Chronic right external capsule lacunar infarct. 3. Resolved intravenous gas, likely was IV access related.  Electronically Signed   By: Genevie Ann M.D.   On: 05/08/2021 08:06    Medications:  sodium chloride     sodium chloride     sodium chloride     sodium chloride      calcium carbonate  1 tablet Oral TID WC   Chlorhexidine Gluconate Cloth  6 each Topical Q0600   famotidine  20 mg Oral BID   heparin  5,000 Units Subcutaneous Q8H   lidocaine  1 patch Transdermal Q24H   LORazepam  1 mg Intravenous Once   metoprolol tartrate  25 mg  Oral BID   sodium chloride flush  3 mL Intravenous Q12H    Dialysis Orders: Center: [facility]  on TTS . EDW 70kg HD Bath 2K/2Ca  Time 4 hours Heparin none. Access R forearm AVG BFR 350 DFR 800    Micera 100 mcg every 2 weeks   Assessment/Plan:  Hyperkalemia - due to noncompliance with dialysis.  Given IV Ca, insulin/D50, and bicarb.  HD yesterday.  K improved to 5.3 this AM.  Hypoglycemia - Improved.  Encephalopathy - developed during admission. CT head with no acute findings.  Ammonia, salicylate and tylenol levels normal.  Checking blood cultures and MRI.   ESRD -  chronic non compliance worsened over the last month.  On TTS schedule.  HD today per regular schedule.  Hypertension/volume  - Blood pressure elevated this AM.  Continue home meds, increase lopressor if BP not improved.  Does not appear grossly volume overloaded.  CXR with no acute abnormalities. Continue UF as tolerated. Under dry weight, will need to be lowered on d/c.   Anemia  - Hgb 11.0. no indication for ESA.  Metabolic bone disease -  Phos elevated, continue calcium acetate with meals. May need to change to non calcium based binder. Last pth 91, hold vit D due to elevated Calcium/suppressed pth.    Nutrition -  renal diet w/fluid restrictions Right flank pain - s/p fall.  No acute findings on imaging.  Elevated AST and ALT - unclear etiology.  trending down. abd CT with no acute abnormalities.    Jen Mow, PA-C [facility] 05/08/2021,2:53 PM  LOS: 3 days

## 2021-05-08 NOTE — Progress Notes (Signed)
OT Cancellation Note  Patient Details Name: Albert Harris MRN: [id number] DOB: 11-01-1955   Cancelled Treatment:    Reason Eval/Treat Not Completed: Fatigue/lethargy limiting ability to participate- pt lethargic and unable to keep eyes open or follow commands.  RN reports pt receiving ativan last night.  OT will check back as able.   Jolaine Artist, OT Acute Rehabilitation Services Pager [phone] Office [phone]   Delight Stare 05/08/2021, 9:23 AM

## 2021-05-08 NOTE — Progress Notes (Signed)
Pt was trying to go to the bathroom unassisted and had an unwitnessed fall and hit the back of his head. MD informed and Head CT ordered. No obvious injury sustained. Pt denies pain from the fall. Family was called and this RN was able to talk with wife Albert Harris. Bed alarm was on when the fall happened. Floor mats placed. No change from prior mentation assessment. Pt still alert to self and place, disoriented to time and situation. Pt vitals stable, BP already previously elevated, MD aware. Will continue to monitor pt.    05/07/21 2317  What Happened  Was fall witnessed? No  Patients activity before fall to/from bed, chair, or stretcher  Was patient injured? No  Patient found on floor  Found by Staff-comment  Stated prior activity to/from bed, chair, or stretcher  Follow Up  MD notified Dr. Tonie Griffith  Time MD notified 2319  Family notified Yes - comment  Time family notified 2329  Additional tests Yes-comment (Head CT)  Simple treatment Other (comment)  Progress note created (see row info) Yes  Adult Fall Risk Assessment  Risk Factor Category (scoring not indicated) High fall risk per protocol (document High fall risk)  Age 65  Fall History: Fall within 6 months prior to admission 5  Elimination; Bowel and/or Urine Incontinence 2  Elimination; Bowel and/or Urine Urgency/Frequency 2  Medications: includes PCA/Opiates, Anti-convulsants, Anti-hypertensives, Diuretics, Hypnotics, Laxatives, Sedatives, and Psychotropics 5  Patient Care Equipment 1  Mobility-Assistance 2  Mobility-Gait 2  Mobility-Sensory Deficit 0  Altered awareness of immediate physical environment 1  Impulsiveness 2  Lack of understanding of one's physical/cognitive limitations 4  Total Score 27  Patient Fall Risk Level High fall risk  Adult Fall Risk Interventions  Required Bundle Interventions *See Row Information* High fall risk - low, moderate, and high requirements implemented  Additional Interventions Room near  nurses station;Use of appropriate toileting equipment (bedpan, BSC, etc.)  Screening for Fall Injury Risk (To be completed on HIGH fall risk patients) - Assessing Need for Floor Mats  Risk For Fall Injury- Criteria for Floor Mats Admitted as a result of a fall  Will Implement Floor Mats Yes  Vitals  Temp 97.7 F (36.5 C)  Temp Source Oral  BP (!) 200/109  MAP (mmHg) 133  BP Location Left Arm  BP Method Automatic  Patient Position (if appropriate) Lying  Pulse Rate 75  Pulse Rate Source Monitor  ECG Heart Rate 75  Resp 17  Oxygen Therapy  SpO2 97 %  O2 Device Room Air  Pain Assessment  Pain Scale 0-10  Pain Score 0  PCA/Epidural/Spinal Assessment  Respiratory Pattern Regular  Neurological  Neuro (WDL) X  Level of Consciousness Alert  Orientation Level Oriented to person;Oriented to place;Disoriented to time;Disoriented to situation  Cognition Impulsive;Poor attention/concentration;Poor judgement;Poor safety awareness  Speech Clear  Pupil Assessment  Yes  R Pupil Size (mm) 3  R Pupil Shape Round  R Pupil Reaction Brisk  L Pupil Size (mm) 3  L Pupil Shape Round  L Pupil Reaction Brisk  Glasgow Coma Scale  Eye Opening 4  Best Verbal Response (NON-intubated) 4  Best Motor Response 6  Glasgow Coma Scale Score 14  Musculoskeletal  Musculoskeletal (WDL) X  Assistive Device Front wheel walker  Generalized Weakness Yes  Weight Bearing Restrictions No  Integumentary  Integumentary (WDL) X  Skin Color Pale  Skin Condition Dry  Skin Turgor Non-tenting  Pain Assessment  Date Pain First Started 05/07/21  Result of Injury No

## 2021-05-08 NOTE — Progress Notes (Signed)
Pt transported to dialysis by transporter via hospital bed.

## 2021-05-09 ENCOUNTER — Other Ambulatory Visit: Payer: Self-pay

## 2021-05-09 ENCOUNTER — Inpatient Hospital Stay (HOSPITAL_COMMUNITY): Payer: Medicare Other

## 2021-05-09 DIAGNOSIS — R7881 Bacteremia: Secondary | ICD-10-CM | POA: Diagnosis not present

## 2021-05-09 DIAGNOSIS — E875 Hyperkalemia: Secondary | ICD-10-CM | POA: Diagnosis not present

## 2021-05-09 DIAGNOSIS — G9341 Metabolic encephalopathy: Secondary | ICD-10-CM

## 2021-05-09 HISTORY — DX: Metabolic encephalopathy: G93.41

## 2021-05-09 HISTORY — DX: Bacteremia: R78.81

## 2021-05-09 LAB — ECHOCARDIOGRAM COMPLETE
AR max vel: 2.33 cm2
AV Area VTI: 1.92 cm2
AV Area mean vel: 2.22 cm2
AV Mean grad: 6 mmHg
AV Peak grad: 13.4 mmHg
Ao pk vel: 1.83 m/s
Area-P 1/2: 2.69 cm2
S' Lateral: 4 cm
Weight: 2116.42 oz

## 2021-05-09 LAB — BLOOD CULTURE ID PANEL (REFLEXED) - BCID2
A.calcoaceticus-baumannii: NOT DETECTED
Bacteroides fragilis: NOT DETECTED
Candida albicans: NOT DETECTED
Candida auris: NOT DETECTED
Candida glabrata: NOT DETECTED
Candida krusei: NOT DETECTED
Candida parapsilosis: NOT DETECTED
Candida tropicalis: NOT DETECTED
Cryptococcus neoformans/gattii: NOT DETECTED
Enterobacter cloacae complex: NOT DETECTED
Enterobacterales: NOT DETECTED
Enterococcus Faecium: NOT DETECTED
Enterococcus faecalis: NOT DETECTED
Escherichia coli: NOT DETECTED
Haemophilus influenzae: NOT DETECTED
Klebsiella aerogenes: NOT DETECTED
Klebsiella oxytoca: NOT DETECTED
Klebsiella pneumoniae: NOT DETECTED
Listeria monocytogenes: NOT DETECTED
Meth resistant mecA/C and MREJ: DETECTED — AB
Methicillin resistance mecA/C: DETECTED — AB
Neisseria meningitidis: NOT DETECTED
Proteus species: NOT DETECTED
Pseudomonas aeruginosa: NOT DETECTED
Salmonella species: NOT DETECTED
Serratia marcescens: NOT DETECTED
Staphylococcus aureus (BCID): DETECTED — AB
Staphylococcus epidermidis: DETECTED — AB
Staphylococcus lugdunensis: NOT DETECTED
Staphylococcus species: DETECTED — AB
Stenotrophomonas maltophilia: NOT DETECTED
Streptococcus agalactiae: NOT DETECTED
Streptococcus pneumoniae: NOT DETECTED
Streptococcus pyogenes: NOT DETECTED
Streptococcus species: NOT DETECTED

## 2021-05-09 LAB — COMPREHENSIVE METABOLIC PANEL
ALT: 1066 U/L — ABNORMAL HIGH (ref 0–44)
AST: 218 U/L — ABNORMAL HIGH (ref 15–41)
Albumin: 3.1 g/dL — ABNORMAL LOW (ref 3.5–5.0)
Alkaline Phosphatase: 108 U/L (ref 38–126)
Anion gap: 15 (ref 5–15)
BUN: 24 mg/dL — ABNORMAL HIGH (ref 8–23)
CO2: 26 mmol/L (ref 22–32)
Calcium: 8.1 mg/dL — ABNORMAL LOW (ref 8.9–10.3)
Chloride: 95 mmol/L — ABNORMAL LOW (ref 98–111)
Creatinine, Ser: 5.1 mg/dL — ABNORMAL HIGH (ref 0.61–1.24)
GFR, Estimated: 12 mL/min — ABNORMAL LOW (ref >60)
Glucose, Bld: 74 mg/dL (ref 70–99)
Potassium: 4.1 mmol/L (ref 3.5–5.1)
Sodium: 136 mmol/L (ref 135–145)
Total Bilirubin: 3.5 mg/dL — ABNORMAL HIGH (ref 0.3–1.2)
Total Protein: 6 g/dL — ABNORMAL LOW (ref 6.5–8.1)

## 2021-05-09 LAB — PROTIME-INR
INR: 1.3 — ABNORMAL HIGH (ref 0.8–1.2)
Prothrombin Time: 16.4 seconds — ABNORMAL HIGH (ref 11.4–15.2)

## 2021-05-09 MED ORDER — VANCOMYCIN HCL IN DEXTROSE 750-5 MG/150ML-% IV SOLN
750.0000 mg | INTRAVENOUS | Status: DC
  Administered 2021-05-13: 750 mg via INTRAVENOUS
  Filled 2021-05-09 (×3): qty 150

## 2021-05-09 MED ORDER — VANCOMYCIN HCL 1500 MG/300ML IV SOLN
1500.0000 mg | Freq: Once | INTRAVENOUS | Status: AC
  Administered 2021-05-09: 1500 mg via INTRAVENOUS
  Filled 2021-05-09: qty 300

## 2021-05-09 NOTE — TOC Progression Note (Addendum)
Transition of Care [facility]) - Progression Note    Patient Details  Name: Albert Harris MRN: [id number] Date of Birth: [birth year]  Transition of Care [facility]) CM/SW [place], RN Phone Number: 05/09/2021, 2:40 PM  Clinical Narrative:     May DC in AM pending [facility] . First blood cultures grew out MRSA and staph. Patient has transportation home/to dialysis. Can check into OP dialysis at 1030 if DC early, but [facility] may still be pending, See renal navigator/CSW note   Expected Discharge Plan: [place] Barriers to Discharge: Barriers Resolved  Expected Discharge Plan and Services Expected Discharge Plan: [place]         Expected Discharge Date: 05/07/21               DME Arranged:  (patient has walker)         HH Arranged: PT [place]: [facility] Date [place]: 05/07/21 Time [place]: [place] Representative spoke with at [place]: [place] ([place]) Interventions    Readmission Risk Interventions Readmission Risk Prevention Plan 05/07/2021 12/19/2020  Transportation Screening Complete Complete  PCP or Specialist Appt within 3-5 Days - Complete  [place]RI or [place] - Complete  Social Work Consult for [place] Planning/Counseling - Complete  Palliative Care Screening - Complete  Medication Review Press photographer) Complete Complete  PCP or Specialist appointment within 3-5 days of discharge Complete -  [place] or [place] Complete -  SW Recovery Care/Counseling Consult Patient refused -  Palliative Care Screening Not Applicable -  [place] Not Applicable -  Some recent data might be hidden

## 2021-05-09 NOTE — Progress Notes (Signed)
PHARMACY - PHYSICIAN COMMUNICATION CRITICAL VALUE ALERT - BLOOD CULTURE IDENTIFICATION (BCID)  Albert Harris is an 65 y.o. male who presented to [facility] on 05/05/2021 with a chief complaint of fall  Assessment:  WBC WNL, ESRD on HD TTS  Name of physician (or Provider) Contacted: Dr. Tonie Griffith  Current antibiotics: None  Changes to prescribed antibiotics recommended:  Vancomycin 1500 mg IV x 1, then 750 mg IV qHD TTS Trend WBC, temp, HD schedule Drug levels as indicated   Results for orders placed or performed during the hospital encounter of 05/05/21  Blood Culture ID Panel (Reflexed) (Collected: 05/08/2021 10:33 AM)  Result Value Ref Range   Enterococcus faecalis NOT DETECTED NOT DETECTED   Enterococcus Faecium NOT DETECTED NOT DETECTED   Listeria monocytogenes NOT DETECTED NOT DETECTED   Staphylococcus species DETECTED (A) NOT DETECTED   Staphylococcus aureus (BCID) DETECTED (A) NOT DETECTED   Staphylococcus epidermidis DETECTED (A) NOT DETECTED   Staphylococcus lugdunensis NOT DETECTED NOT DETECTED   Streptococcus species NOT DETECTED NOT DETECTED   Streptococcus agalactiae NOT DETECTED NOT DETECTED   Streptococcus pneumoniae NOT DETECTED NOT DETECTED   Streptococcus pyogenes NOT DETECTED NOT DETECTED   A.calcoaceticus-baumannii NOT DETECTED NOT DETECTED   Bacteroides fragilis NOT DETECTED NOT DETECTED   Enterobacterales NOT DETECTED NOT DETECTED   Enterobacter cloacae complex NOT DETECTED NOT DETECTED   Escherichia coli NOT DETECTED NOT DETECTED   Klebsiella aerogenes NOT DETECTED NOT DETECTED   Klebsiella oxytoca NOT DETECTED NOT DETECTED   Klebsiella pneumoniae NOT DETECTED NOT DETECTED   Proteus species NOT DETECTED NOT DETECTED   Salmonella species NOT DETECTED NOT DETECTED   Serratia marcescens NOT DETECTED NOT DETECTED   Haemophilus influenzae NOT DETECTED NOT DETECTED   Neisseria meningitidis NOT DETECTED NOT DETECTED   Pseudomonas aeruginosa NOT DETECTED NOT  DETECTED   Stenotrophomonas maltophilia NOT DETECTED NOT DETECTED   Candida albicans NOT DETECTED NOT DETECTED   Candida auris NOT DETECTED NOT DETECTED   Candida glabrata NOT DETECTED NOT DETECTED   Candida krusei NOT DETECTED NOT DETECTED   Candida parapsilosis NOT DETECTED NOT DETECTED   Candida tropicalis NOT DETECTED NOT DETECTED   Cryptococcus neoformans/gattii NOT DETECTED NOT DETECTED   Methicillin resistance mecA/C DETECTED (A) NOT DETECTED   Meth resistant mecA/C and MREJ DETECTED (A) NOT DETECTED   Narda Bonds, PharmD, BCPS Clinical Pharmacist Phone: [phone]

## 2021-05-09 NOTE — Progress Notes (Signed)
PROGRESS NOTE    Albert Harris  XBM:[id number] DOB: May 04, 1956 DOA: 05/05/2021 PCP: Kerin Perna, NP    Brief Narrative:  65 year old gentleman with history of ESRD on hemodialysis, COPD, peripheral vascular disease, hypertension, hyperlipidemia and severe noncompliance to dialysis regimen presented with fall at home.  Not going to hemodialysis since last 1 week.  Not eating well since Thanksgiving as per family.  In the emergency room hemodynamically stable.  Potassium 7. AST and ALT 2428/2294 with bilirubin of 2.  Urgently hemodialyzed.  Skeletal survey negative.  Patient has clinically improved and prepared to discharge after hemodialysis on 11/30.  He became very confused and combative so discharge was canceled.  Patient remained encephalopathic overnight with no obvious known cause. 12/2, most of the delirium improved now.  Blood cultures positive for staph epidermidis as well methicillin-resistant.   Assessment & Plan:   Principal Problem:   Hyperkalemia Active Problems:   ESRD (end stage renal disease) (HCC)   Hypertension   COPD (chronic obstructive pulmonary disease) (HCC)   Elevated LFTs   Hypoglycemia   Thrombocytopenia (HCC)   Fall at home, initial encounter   Bacteremia due to Gram-positive bacteria   Acute metabolic encephalopathy  Acute metabolic encephalopathy: Developed in the hospital.  Primary cause unknown. -No drugs or alcohol -CT head was normal.  Subsequent MRI with a small subdural possibly happened after a fall in the bathroom but no other complications. -Ammonia, salicylate, Tylenol levels normal.  Blood gases essentially normal. -LFTs are already improving and downtrending. -Adequately stabilizing.  Continue fall precautions delirium precautions.  Avoid any benzodiazepines or opiates.  ESRD on hemodialysis, missed hemodialysis resulting in hyperkalemia: Treated with temporizing measures with calcium gluconate, insulin and dextrose infusion as  well bicarbonate. Patient underwent consecutive dialysis sessions with improvement. Next dialysis tomorrow.   Elevated LFTs: Consistent with shock liver.  CT scan of the abdomen pelvis with no obvious etiology or abnormalities. Hepatitis panel negative. Tylenol and salicylate levels are normal. Right upper quadrant ultrasound is essentially normal with unremarkable gallbladder and bile duct. Continue monitoring to ensure stabilization.   Hypoglycemia: Due to poor oral intake and insulin use per correction of hyperkalemia.  Regular diet.  Blood sugars stabilized after eating regular diet.  COPD: Stable.  Albuterol as needed.   Fall: Patient has right lateral rib pain and tenderness that is reproducible.  Skeletal survey negative.  Local lidocaine patch.  Positive blood cultures: Staph epidermidis and methicillin-resistant noted.  Unsure about significance. Started on vancomycin.  Renally dose with dialysis. Repeat blood cultures tomorrow morning. 2D echocardiogram today to look for any vegetations.  Right upper arm AV fistula is nontender.   Continue attempt to mobilize.  Monitor for positive blood cultures.  Anticipate discharge home tomorrow if contaminant cultures.  DVT prophylaxis: heparin injection 5,000 Units Start: 05/06/21 0000   Code Status: Full code Family Communication: Wife on the phone. Disposition Plan: Status is: Inpatient  Remains inpatient appropriate because: Altered mental status, positive blood cultures.         Consultants:  Nephrology  Procedures:  Hemodialysis  Antimicrobials:  Vancomycin 12/1---   Subjective: Patient seen and examined.  Went to bed overnight and woke up fresh and without confusion and disorientation. He tells me he does not remember what happened.  He apologizes if he had misbehaved. Surprisingly clear mentation today.   Objective: Vitals:   05/09/21 0558 05/09/21 0616 05/09/21 0738 05/09/21 1200  BP: (!) 200/92 (!)  141/92 123/67 (!) 153/76  Pulse:  71 64 64 94  Resp: 20 19 17 16   Temp: 98.1 F (36.7 C)  97.9 F (36.6 C) 98 F (36.7 C)  TempSrc: Oral  Oral Oral  SpO2: 99% 97% 97% 93%  Weight:        Intake/Output Summary (Last 24 hours) at 05/09/2021 1322 Last data filed at 05/09/2021 0900 Gross per 24 hour  Intake 402.08 ml  Output 3000 ml  Net -2597.92 ml   Filed Weights   05/08/21 1203 05/08/21 1611 05/09/21 0500  Weight: 62.2 kg 60.3 kg 60 kg    Examination:  General exam: Appears comfortable.  Alert oriented x4.  On room air. Respiratory system: Clear to auscultation. Respiratory effort normal.  No added sounds. Cardiovascular system: S1 & S2 heard, RRR.  No edema. Gastrointestinal system: Abdomen is nondistended, soft and nontender. No organomegaly or masses felt. Normal bowel sounds heard. Central nervous system: No deficits. Right upper extremity AV fistula is nontender.    Data Reviewed: I have personally reviewed following labs and imaging studies  CBC: Recent Labs  Lab 05/05/21 1534 05/06/21 0100 05/06/21 0300 05/08/21 1435 05/08/21 2048  WBC 13.0* 11.7* 11.8* 7.8 7.2  NEUTROABS 10.5*  --   --   --   --   HGB 12.0* 10.7* 11.0* 12.7* 18.3*  HCT 38.5* 34.1* 34.0* 39.8 58.6*  MCV 101.3* 100.3* 98.0 96.8 98.5  PLT 102* 97* 94* 80* 67*   Basic Metabolic Panel: Recent Labs  Lab 05/06/21 0100 05/06/21 0300 05/07/21 0217 05/08/21 0241 05/09/21 0303  NA 138 139 135 135 136  K 5.9* 6.0* 5.7* 5.3* 4.1  CL 95* 95* 95* 96* 95*  CO2 20* 22 22 19* 26  GLUCOSE 100* 84 90 76 74  BUN 107* 107* 54* 30* 24*  CREATININE 16.33* 16.65* 11.01* 6.59* 5.10*  CALCIUM 9.4 9.4 9.5 8.6* 8.1*  PHOS  --  11.3*  --   --   --    GFR: Estimated Creatinine Clearance: 12.1 mL/min (A) (by C-G formula based on SCr of 5.1 mg/dL (H)). Liver Function Tests: Recent Labs  Lab 05/06/21 0100 05/06/21 0300 05/07/21 0217 05/07/21 0954 05/08/21 0241 05/09/21 0303  AST 1,777*  --  QUANTITY  NOT SUFFICIENT, UNABLE TO PERFORM TEST 544* 462* 218*  ALT 2,008*  --  QUANTITY NOT SUFFICIENT, UNABLE TO PERFORM TEST 1,563* 1,356* 1,066*  ALKPHOS 104  --  120 126 119 108  BILITOT 2.1*  --  QUANTITY NOT SUFFICIENT, UNABLE TO PERFORM TEST 2.1* 2.8* 3.5*  PROT 5.8*  --  5.7* 6.2* 5.8* 6.0*  ALBUMIN 3.2* 3.2* 3.1* 3.2* 3.1* 3.1*   No results for input(s): LIPASE, AMYLASE in the last 168 hours. Recent Labs  Lab 05/08/21 1020  AMMONIA 36*   Coagulation Profile: Recent Labs  Lab 05/06/21 0100 05/09/21 0303  INR 2.8* 1.3*   Cardiac Enzymes: No results for input(s): CKTOTAL, CKMB, CKMBINDEX, TROPONINI in the last 168 hours. BNP (last 3 results) No results for input(s): PROBNP in the last 8760 hours. HbA1C: No results for input(s): HGBA1C in the last 72 hours. CBG: Recent Labs  Lab 05/06/21 0739 05/06/21 0804 05/06/21 0833 05/06/21 1822 05/07/21 1842  GLUCAP 58* 98 93 111* 85   Lipid Profile: No results for input(s): CHOL, HDL, LDLCALC, TRIG, CHOLHDL, LDLDIRECT in the last 72 hours. Thyroid Function Tests: No results for input(s): TSH, T4TOTAL, FREET4, T3FREE, THYROIDAB in the last 72 hours. Anemia Panel: No results for input(s): VITAMINB12, FOLATE, FERRITIN, TIBC, IRON, RETICCTPCT in  the last 72 hours. Sepsis Labs: No results for input(s): PROCALCITON, LATICACIDVEN in the last 168 hours.  Recent Results (from the past 240 hour(s))  Resp Panel by RT-PCR (Flu A&B, Covid) Nasopharyngeal Swab     Status: None   Collection Time: 05/05/21 10:42 PM   Specimen: Nasopharyngeal Swab; Nasopharyngeal(NP) swabs in vial transport medium  Result Value Ref Range Status   SARS Coronavirus 2 by RT PCR NEGATIVE NEGATIVE Final    Comment: (NOTE) SARS-CoV-2 target nucleic acids are NOT DETECTED.  The SARS-CoV-2 RNA is generally detectable in upper respiratory specimens during the acute phase of infection. The lowest concentration of SARS-CoV-2 viral copies this assay can detect is 138  copies/mL. A negative result does not preclude SARS-Cov-2 infection and should not be used as the sole basis for treatment or other patient management decisions. A negative result may occur with  improper specimen collection/handling, submission of specimen other than nasopharyngeal swab, presence of viral mutation(s) within the areas targeted by this assay, and inadequate number of viral copies(<138 copies/mL). A negative result must be combined with clinical observations, patient history, and epidemiological information. The expected result is Negative.  Fact Sheet for Patients:  EntrepreneurPulse.com.au  Fact Sheet for Healthcare Providers:  IncredibleEmployment.be  This test is no t yet approved or cleared by the [place] FDA and  has been authorized for detection and/or diagnosis of SARS-CoV-2 by FDA under an Emergency Use Authorization (EUA). This EUA will remain  in effect (meaning this test can be used) for the duration of the COVID-19 declaration under Section 564(b)(1) of the Act, 21 U.S.C.section 360bbb-3(b)(1), unless the authorization is terminated  or revoked sooner.       Influenza A by PCR NEGATIVE NEGATIVE Final   Influenza B by PCR NEGATIVE NEGATIVE Final    Comment: (NOTE) The Xpert Xpress SARS-CoV-2/FLU/RSV plus assay is intended as an aid in the diagnosis of influenza from Nasopharyngeal swab specimens and should not be used as a sole basis for treatment. Nasal washings and aspirates are unacceptable for Xpert Xpress SARS-CoV-2/FLU/RSV testing.  Fact Sheet for Patients: EntrepreneurPulse.com.au  Fact Sheet for Healthcare Providers: IncredibleEmployment.be  This test is not yet approved or cleared by the [place] FDA and has been authorized for detection and/or diagnosis of SARS-CoV-2 by FDA under an Emergency Use Authorization (EUA). This EUA will remain in effect (meaning  this test can be used) for the duration of the COVID-19 declaration under Section 564(b)(1) of the Act, 21 U.S.C. section 360bbb-3(b)(1), unless the authorization is terminated or revoked.  Performed at [facility], [place] [address] [place], [place] [address]   Culture, blood (routine x 2)     Status: None (Preliminary result)   Collection Time: 05/08/21 10:33 AM   Specimen: BLOOD  Result Value Ref Range Status   Specimen Description BLOOD LEFT ANTECUBITAL  Final   Special Requests   Final    BOTTLES DRAWN AEROBIC AND ANAEROBIC Blood Culture results may not be optimal due to an inadequate volume of blood received in culture bottles   Culture  Setup Time   Final    GRAM POSITIVE COCCI IN BOTH AEROBIC AND ANAEROBIC BOTTLES CRITICAL RESULT CALLED TO, READ BACK BY AND VERIFIED WITH: PHARMD JAMES LEDFORD 05/09/21@4 :55 BY TW    Culture   Final    NO GROWTH < 24 HOURS Performed at [facility], [place] [address], [place], [place] [address]    Report Status PENDING  Incomplete  Blood Culture  ID Panel (Reflexed)     Status: Abnormal   Collection Time: 05/08/21 10:33 AM  Result Value Ref Range Status   Enterococcus faecalis NOT DETECTED NOT DETECTED Final   Enterococcus Faecium NOT DETECTED NOT DETECTED Final   Listeria monocytogenes NOT DETECTED NOT DETECTED Final   Staphylococcus species DETECTED (A) NOT DETECTED Final    Comment: CRITICAL RESULT CALLED TO, READ BACK BY AND VERIFIED WITH: PHARMD JAMES LEDFORD 05/09/21@4 :55 BY TW    Staphylococcus aureus (BCID) DETECTED (A) NOT DETECTED Final    Comment: Methicillin (oxacillin)-resistant Staphylococcus aureus (MRSA). MRSA is predictably resistant to beta-lactam antibiotics (except ceftaroline). Preferred therapy is vancomycin unless clinically contraindicated. Patient requires contact precautions if  hospitalized. CRITICAL RESULT CALLED TO, READ BACK BY AND VERIFIED WITH: PHARMD JAMES LEDFORD 05/09/21@4 :55 BY TW     Staphylococcus epidermidis DETECTED (A) NOT DETECTED Final    Comment: CRITICAL RESULT CALLED TO, READ BACK BY AND VERIFIED WITH: PHARMD JAMES LEDFORD 05/09/21@4 :55 BY TW    Staphylococcus lugdunensis NOT DETECTED NOT DETECTED Final   Streptococcus species NOT DETECTED NOT DETECTED Final   Streptococcus agalactiae NOT DETECTED NOT DETECTED Final   Streptococcus pneumoniae NOT DETECTED NOT DETECTED Final   Streptococcus pyogenes NOT DETECTED NOT DETECTED Final   A.calcoaceticus-baumannii NOT DETECTED NOT DETECTED Final   Bacteroides fragilis NOT DETECTED NOT DETECTED Final   Enterobacterales NOT DETECTED NOT DETECTED Final   Enterobacter cloacae complex NOT DETECTED NOT DETECTED Final   Escherichia coli NOT DETECTED NOT DETECTED Final   Klebsiella aerogenes NOT DETECTED NOT DETECTED Final   Klebsiella oxytoca NOT DETECTED NOT DETECTED Final   Klebsiella pneumoniae NOT DETECTED NOT DETECTED Final   Proteus species NOT DETECTED NOT DETECTED Final   Salmonella species NOT DETECTED NOT DETECTED Final   Serratia marcescens NOT DETECTED NOT DETECTED Final   Haemophilus influenzae NOT DETECTED NOT DETECTED Final   Neisseria meningitidis NOT DETECTED NOT DETECTED Final   Pseudomonas aeruginosa NOT DETECTED NOT DETECTED Final   Stenotrophomonas maltophilia NOT DETECTED NOT DETECTED Final   Candida albicans NOT DETECTED NOT DETECTED Final   Candida auris NOT DETECTED NOT DETECTED Final   Candida glabrata NOT DETECTED NOT DETECTED Final   Candida krusei NOT DETECTED NOT DETECTED Final   Candida parapsilosis NOT DETECTED NOT DETECTED Final   Candida tropicalis NOT DETECTED NOT DETECTED Final   Cryptococcus neoformans/gattii NOT DETECTED NOT DETECTED Final   Methicillin resistance mecA/C DETECTED (A) NOT DETECTED Final    Comment: CRITICAL RESULT CALLED TO, READ BACK BY AND VERIFIED WITH: PHARMD JAMES LEDFORD 05/09/21@4 :55 BY TW    Meth resistant mecA/C and MREJ DETECTED (A) NOT DETECTED Final     Comment: CRITICAL RESULT CALLED TO, READ BACK BY AND VERIFIED WITH: PHARMD JAMES LEDFORD 05/09/21@4 :55 BY TW Performed at [facility], [place] [address], [place], [place] [address]   Culture, blood (routine x 2)     Status: None (Preliminary result)   Collection Time: 05/08/21 10:40 AM   Specimen: BLOOD LEFT HAND  Result Value Ref Range Status   Specimen Description BLOOD LEFT HAND  Final   Special Requests   Final    BOTTLES DRAWN AEROBIC ONLY Blood Culture results may not be optimal due to an inadequate volume of blood received in culture bottles   Culture   Final    NO GROWTH < 24 HOURS Performed at [facility], [place] [address], [place], [place] [address]    Report Status PENDING  Incomplete  Radiology Studies: CT HEAD WO CONTRAST (5MM)  Result Date: 05/08/2021 CLINICAL DATA:  65 year old male status post fall. End stage renal disease. EXAM: CT HEAD WITHOUT CONTRAST TECHNIQUE: Contiguous axial images were obtained from the base of the skull through the vertex without intravenous contrast. COMPARISON:  Head CT 05/05/2021.  Brain MRI 11/10/2017. FINDINGS: Brain: Chronic lacunar infarct anterior right external capsule. Stable cerebral volume. No midline shift, ventriculomegaly, mass effect, evidence of mass lesion, intracranial hemorrhage or evidence of cortically based acute infarction. Stable gray-white matter differentiation throughout the brain. Vascular: Extensive Calcified atherosclerosis at the skull base. Resolved gas previously seen within the superior sagittal sinus, and to a lesser extent cavernous sinus and facial veins. Calcified MCA atherosclerosis is apparent. No suspicious intracranial vascular hyperdensity. Skull: Stable, intact. Sinuses/Orbits: Visualized paranasal sinuses and mastoids are clear. Other: No orbit or scalp soft tissue injury identified. IMPRESSION: 1. No acute intracranial abnormality or recent traumatic injury identified. 2. Extensive  6intracranial atherosclerosis. Chronic right external capsule lacunar infarct. 3. Resolved intravenous gas, likely was IV access related. Electronically Signed   By: Genevie Ann M.D.   On: 05/08/2021 08:06   MR BRAIN WO CONTRAST  Addendum Date: 05/09/2021   ADDENDUM REPORT: 05/09/2021 12:04 ADDENDUM: Impression #2 called by telephone at the time of interpretation on 05/09/2021 at 12:03 pm to provider [facility] , who verbally acknowledged these results. Electronically Signed   By: Kellie Simmering D.O.   On: 05/09/2021 12:04   Result Date: 05/09/2021 CLINICAL DATA:  Mental status change, persistent or worsening; sudden onset delirium. EXAM: MRI HEAD WITHOUT CONTRAST TECHNIQUE: Multiplanar, multiecho pulse sequences of the brain and surrounding structures were obtained without intravenous contrast. COMPARISON:  Prior head CT examinations 05/08/2021 and earlier. MRI brain and MRA head 11/10/2017. FINDINGS: Brain: Intermittently motion degraded examination, limiting evaluation. Most notably, there is mild-to-moderate motion degradation of the axial T2 TSE sequence, moderate/severe motion degradation of the axial T2 FLAIR sequence and moderate motion degradation of the coronal T2 TSE sequence. Mild-to-moderate generalized cerebral atrophy. Comparatively mild cerebellar atrophy. Redemonstrated chronic small-vessel infarct within the right basal ganglia/external capsule. Background mild multifocal T2 FLAIR hyperintense signal abnormality within the cerebral white matter, nonspecific but compatible with chronic small vessel ischemic disease. Chronic microhemorrhages within the right frontal, parietal and temporal lobes, new from the prior MRI (3 total). Asymmetric T2* signal loss along the left tentorium, suspicious for a trace subdural hematoma. In retrospect, there was trace asymmetric hyperdensity along the left tentorium on the head CT of 05/08/2021. There is no acute infarct. No evidence of an intracranial mass. No  extra-axial fluid collection. No midline shift. Vascular: Signal abnormality within portions of the V4 right vertebral artery suggesting high-grade stenosis or vessel occlusion. Flow voids otherwise maintained within the proximal large arterial vessels. Skull and upper cervical spine: No focal suspicious marrow lesion. Diffuse osseous heterogeneity, likely related to end-stage renal disease. Sinuses/Orbits: Visualized orbits show no acute finding. Minimal scattered paranasal sinus mucosal thickening at the imaged levels. Other: Trace fluid within the left mastoid air cells. IMPRESSION: Intermittently motion degraded examination, as described. Suspected trace acute subdural hematoma layering along the left tentorium. No evidence of acute infarction. Redemonstrated chronic small-vessel infarct within the right basal ganglia/external capsule. Background mild chronic small vessel ischemic changes within the cerebral white matter, progressed from the brain MRI of 11/10/2017. Mild-to-moderate generalized cerebral atrophy. Comparatively mild cerebellar atrophy. Signal abnormality within the intracranial right vertebral artery, suggesting high-grade stenosis or vessel occlusion. This is a new  finding as compared to the MRI/MRA of 11/10/2017. Trace left mastoid effusion. Electronically Signed: By: Kellie Simmering D.O. On: 05/09/2021 11:56        Scheduled Meds:  calcium carbonate  1 tablet Oral TID WC   Chlorhexidine Gluconate Cloth  6 each Topical Q0600   famotidine  20 mg Oral Daily   heparin  5,000 Units Subcutaneous Q8H   lidocaine  1 patch Transdermal Q24H   LORazepam  1 mg Intravenous Once   metoprolol tartrate  25 mg Oral BID   sodium chloride flush  3 mL Intravenous Q12H   Continuous Infusions:  [START ON 05/10/2021] vancomycin       LOS: 4 days    Time spent: 30 minutes    Barb Merino, MD [facility] Pager [phone]

## 2021-05-09 NOTE — Progress Notes (Signed)
Physical Therapy Treatment Patient Details Name: Albert Harris MRN: [id number] DOB: 07/06/1955 Today's Date: 05/09/2021   History of Present Illness Pt is a 65 y/o male admitted 11/28 after a fall at home. Pt with low back pain from fall but imaging negative.  Found with hyperkalemia, hyperglycemia in setting of nonadherence to hemodialysis. Fall on 11/30 with increased confusion- imaging negative. PMH includes: ESRD on TTS HD, COPD, PVD, HTN, PFO, PNA, CVA, [name].    PT Comments    Pt is up to walk with PT but mainly is mildly confused and requiring redirection with all movement.  He is appropriate for home therapy but will require family to assist him for safety.  In addition, will need to be monitored for follow through on his medical appts, so that he is not quickly returning due to lack of care with his renal condition.  Has his family at home to help him to stay on track and will use hosp stay to increase his strength and endurance to make a dc to home more successful.  Pt is attentive to instructions but do not see how he can avoid getting them during gait to be more stable.   Follow up with goals of acute PT as outlined.  Recommendations for follow up therapy are one component of a multi-disciplinary discharge planning process, led by the attending physician.  Recommendations may be updated based on patient status, additional functional criteria and insurance authorization.  Follow Up Recommendations  Home health PT     Assistance Recommended at Discharge Intermittent Supervision/Assistance  Equipment Recommendations  None recommended by PT    Recommendations for Other Services       Precautions / Restrictions Precautions Precautions: Fall Precaution Comments: monitor for impulsive standing attempts Restrictions Weight Bearing Restrictions: No     Mobility  Bed Mobility Overal bed mobility: Needs Assistance Bed Mobility: Supine to Sit;Sit to Supine Rolling: Min guard    Supine to sit: Min guard Sit to supine: Min guard   General bed mobility comments: for lines and safety with obstacles    Transfers Overall transfer level: Needs assistance Equipment used: Rolling walker (2 wheels) Transfers: Sit to/from Stand Sit to Stand: Min guard           General transfer comment: min guard and cues for waiting to get lines before moving him    Ambulation/Gait Ambulation/Gait assistance: Min guard Gait Distance (Feet): 120 Feet (in 20' lengths) Assistive device: Rolling walker (2 wheels) Gait Pattern/deviations: Step-through pattern;Drifts right/left Gait velocity: reduced Gait velocity interpretation: <1.31 ft/sec, indicative of household ambulator Pre-gait activities: posture correction and safety with sidestepping on walker first General Gait Details: requires cues for managing turn safety with lines, with distance from obstacles and not rushing   Marine scientist [place] (Stroke Patients Only)       Balance   Sitting-balance support: Feet supported Sitting balance-Leahy Scale: Good     Standing balance support: Bilateral upper extremity supported Standing balance-Leahy Scale: Fair Standing balance comment: less than fair dynamically                            Cognition Arousal/Alertness: Awake/alert Behavior During Therapy: Flat affect;Impulsive Overall Cognitive Status: Impaired/Different from baseline                   Orientation Level: Situation;Time Current Attention  Level: Selective Memory: Decreased short-term memory;Decreased recall of precautions Following Commands: Follows one step commands with increased time Safety/Judgement: Decreased awareness of deficits;Decreased awareness of safety Awareness: Anticipatory;Emergent;Intellectual Problem Solving: Slow processing;Requires verbal cues General Comments: pt required reminders for safety with mobility to slow  down turns, to wait to stand and not to try to move independently        Exercises      General Comments        Pertinent Vitals/Pain Pain Assessment: No/denies pain    Home Living                          Prior Function            PT Goals (current goals can now be found in the care plan section) Acute Rehab PT Goals Patient Stated Goal: to go home Progress towards PT goals: Progressing toward goals    Frequency    Min 3X/week      PT Plan Current plan remains appropriate    Co-evaluation              AM-PAC PT "6 Clicks" Mobility   Outcome Measure  Help needed turning from your back to your side while in a flat bed without using bedrails?: None Help needed moving from lying on your back to sitting on the side of a flat bed without using bedrails?: None Help needed moving to and from a bed to a chair (including a wheelchair)?: A Little Help needed standing up from a chair using your arms (e.g., wheelchair or bedside chair)?: A Little Help needed to walk in hospital room?: A Little Help needed climbing 3-5 steps with a railing? : A Lot 6 Click Score: 19    End of Session Equipment Utilized During Treatment: Gait belt Activity Tolerance: Patient tolerated treatment well Patient left: in bed;with call bell/phone within reach Nurse Communication: Mobility status PT Visit Diagnosis: Muscle weakness (generalized) (M62.81)     Time: 4163-8453 PT Time Calculation (min) (ACUTE ONLY): 31 min  Charges:  $Gait Training: 8-22 mins $Therapeutic Activity: 8-22 mins            Ramond Dial 05/09/2021, 7:28 PM  Mee Hives, PT PhD Acute Rehab Dept. Number: [facility]

## 2021-05-09 NOTE — Progress Notes (Signed)
[facility] Progress Note   Subjective:  Seen in room - sitting on edge of bed and eating lunch. C/o back pain, no CP or dyspnea. Brain MRI this morning with trace subdural hematoma, possible stenosis of R vertebral artery. Also, appears that 1 of 2 Blood Cx from 12/1 are now positive.  Objective Vitals:   05/09/21 0558 05/09/21 0616 05/09/21 0738 05/09/21 1200  BP: (!) 200/92 (!) 141/92 123/67 (!) 153/76  Pulse: 71 64 64 94  Resp: 20 19 17 16   Temp: 98.1 F (36.7 C)  97.9 F (36.6 C) 98 F (36.7 C)  TempSrc: Oral  Oral Oral  SpO2: 99% 97% 97% 93%  Weight:       Physical Exam General: Chronically ill appearing man, NAD Heart: RRR; no murmur Lungs: CTAB; mid back bruising present Abdomen: soft Extremities: No LE edema Dialysis Access: R forearm AVF + bruit  Additional Objective Labs: Basic Metabolic Panel: Recent Labs  Lab 05/06/21 0300 05/07/21 0217 05/08/21 0241 05/09/21 0303  NA 139 135 135 136  K 6.0* 5.7* 5.3* 4.1  CL 95* 95* 96* 95*  CO2 22 22 19* 26  GLUCOSE 84 90 76 74  BUN 107* 54* 30* 24*  CREATININE 16.65* 11.01* 6.59* 5.10*  CALCIUM 9.4 9.5 8.6* 8.1*  PHOS 11.3*  --   --   --    Liver Function Tests: Recent Labs  Lab 05/07/21 0954 05/08/21 0241 05/09/21 0303  AST 544* 462* 218*  ALT 1,563* 1,356* 1,066*  ALKPHOS 126 119 108  BILITOT 2.1* 2.8* 3.5*  PROT 6.2* 5.8* 6.0*  ALBUMIN 3.2* 3.1* 3.1*   CBC: Recent Labs  Lab 05/05/21 1534 05/06/21 0100 05/06/21 0300 05/08/21 1435 05/08/21 2048  WBC 13.0* 11.7* 11.8* 7.8 7.2  NEUTROABS 10.5*  --   --   --   --   HGB 12.0* 10.7* 11.0* 12.7* 18.3*  HCT 38.5* 34.1* 34.0* 39.8 58.6*  MCV 101.3* 100.3* 98.0 96.8 98.5  PLT 102* 97* 94* 80* 67*   Studies/Results: CT HEAD WO CONTRAST (5MM)  Result Date: 05/08/2021 CLINICAL DATA:  65 year old male status post fall. End stage renal disease. EXAM: CT HEAD WITHOUT CONTRAST TECHNIQUE: Contiguous axial images were obtained from the base  of the skull through the vertex without intravenous contrast. COMPARISON:  Head CT 05/05/2021.  Brain MRI 11/10/2017. FINDINGS: Brain: Chronic lacunar infarct anterior right external capsule. Stable cerebral volume. No midline shift, ventriculomegaly, mass effect, evidence of mass lesion, intracranial hemorrhage or evidence of cortically based acute infarction. Stable gray-white matter differentiation throughout the brain. Vascular: Extensive Calcified atherosclerosis at the skull base. Resolved gas previously seen within the superior sagittal sinus, and to a lesser extent cavernous sinus and facial veins. Calcified MCA atherosclerosis is apparent. No suspicious intracranial vascular hyperdensity. Skull: Stable, intact. Sinuses/Orbits: Visualized paranasal sinuses and mastoids are clear. Other: No orbit or scalp soft tissue injury identified. IMPRESSION: 1. No acute intracranial abnormality or recent traumatic injury identified. 2. Extensive 6intracranial atherosclerosis. Chronic right external capsule lacunar infarct. 3. Resolved intravenous gas, likely was IV access related. Electronically Signed   By: Genevie Ann M.D.   On: 05/08/2021 08:06   MR BRAIN WO CONTRAST  Addendum Date: 05/09/2021   ADDENDUM REPORT: 05/09/2021 12:04 ADDENDUM: Impression #2 called by telephone at the time of interpretation on 05/09/2021 at 12:03 pm to provider [facility] , who verbally acknowledged these results. Electronically Signed   By: Kellie Simmering D.O.   On: 05/09/2021 12:04  Result Date: 05/09/2021 CLINICAL DATA:  Mental status change, persistent or worsening; sudden onset delirium. EXAM: MRI HEAD WITHOUT CONTRAST TECHNIQUE: Multiplanar, multiecho pulse sequences of the brain and surrounding structures were obtained without intravenous contrast. COMPARISON:  Prior head CT examinations 05/08/2021 and earlier. MRI brain and MRA head 11/10/2017. FINDINGS: Brain: Intermittently motion degraded examination, limiting evaluation.  Most notably, there is mild-to-moderate motion degradation of the axial T2 TSE sequence, moderate/severe motion degradation of the axial T2 FLAIR sequence and moderate motion degradation of the coronal T2 TSE sequence. Mild-to-moderate generalized cerebral atrophy. Comparatively mild cerebellar atrophy. Redemonstrated chronic small-vessel infarct within the right basal ganglia/external capsule. Background mild multifocal T2 FLAIR hyperintense signal abnormality within the cerebral white matter, nonspecific but compatible with chronic small vessel ischemic disease. Chronic microhemorrhages within the right frontal, parietal and temporal lobes, new from the prior MRI (3 total). Asymmetric T2* signal loss along the left tentorium, suspicious for a trace subdural hematoma. In retrospect, there was trace asymmetric hyperdensity along the left tentorium on the head CT of 05/08/2021. There is no acute infarct. No evidence of an intracranial mass. No extra-axial fluid collection. No midline shift. Vascular: Signal abnormality within portions of the V4 right vertebral artery suggesting high-grade stenosis or vessel occlusion. Flow voids otherwise maintained within the proximal large arterial vessels. Skull and upper cervical spine: No focal suspicious marrow lesion. Diffuse osseous heterogeneity, likely related to end-stage renal disease. Sinuses/Orbits: Visualized orbits show no acute finding. Minimal scattered paranasal sinus mucosal thickening at the imaged levels. Other: Trace fluid within the left mastoid air cells. IMPRESSION: Intermittently motion degraded examination, as described. Suspected trace acute subdural hematoma layering along the left tentorium. No evidence of acute infarction. Redemonstrated chronic small-vessel infarct within the right basal ganglia/external capsule. Background mild chronic small vessel ischemic changes within the cerebral white matter, progressed from the brain MRI of 11/10/2017.  Mild-to-moderate generalized cerebral atrophy. Comparatively mild cerebellar atrophy. Signal abnormality within the intracranial right vertebral artery, suggesting high-grade stenosis or vessel occlusion. This is a new finding as compared to the MRI/MRA of 11/10/2017. Trace left mastoid effusion. Electronically Signed: By: Kellie Simmering D.O. On: 05/09/2021 11:56    Medications:  [START ON 05/10/2021] vancomycin      calcium carbonate  1 tablet Oral TID WC   Chlorhexidine Gluconate Cloth  6 each Topical Q0600   famotidine  20 mg Oral Daily   heparin  5,000 Units Subcutaneous Q8H   lidocaine  1 patch Transdermal Q24H   LORazepam  1 mg Intravenous Once   metoprolol tartrate  25 mg Oral BID   sodium chloride flush  3 mL Intravenous Q12H    Dialysis Orders: Center: [facility]  on TTS EDW 70kg HD Bath 2K/2Ca  Time 4 hours Heparin none. Access R forearm AVG BFR 350 DFR 800    Micera 100 mcg every 2 weeks   Assessment/Plan:  Hyperkalemia: On admit, treated medically followed by dialysis. K 4.1 today. Hypoglycemia - Improved.  Encephalopathy - developed during admission. CT head with no acute findings.  Ammonia, salicylate and tylenol levels normal. Blood Cx 1 of 2 +, ?contaminant. Brain MRI with small subdural and possible vertebral artery stenosis -> per primary.  ESRD -  chronic non compliance worsened over the last month. Back on TTS schedule now - next tomorrow.  Hypertension/volume: BP variable, no edema. CXR with no acute abnormalities. Continue UF as tolerated. Under dry weight, will need to be lowered on d/c.   Anemia  - Hgb  18.3, ?assuming lab error - repeat tomorrow.  Metabolic bone disease -  Phos elevated, continue calcium acetate with meals. May need to change to non calcium based binder. Last pth 35, hold vit D due to elevated Calcium/suppressed pth.    Nutrition -  renal diet w/fluid restrictions Right flank pain - s/p fall.  No acute findings on imaging.  Elevated AST and ALT -  unclear etiology, ?shock liver.  Abd CT and u/s without acute abnormalities. Improving.   Jen Mow, PA-C  Katie [place], [place] 05/09/2021, 12:44 PM  [employer]

## 2021-05-09 NOTE — Progress Notes (Signed)
Occupational Therapy Treatment Patient Details Name: Albert Harris MRN: [id number] DOB: 05/12/1956 Today's Date: 05/09/2021   History of present illness Pt is a 65 y/o male admitted 11/28 after a fall at home. Pt with low back pain from fall but imaging negative.  Found with hyperkalemia, hyperglycemia in setting of nonadherence to hemodialysis. Fall on 11/30 with increased confusion- imaging negative. PMH includes: ESRD on TTS HD, COPD, PVD, HTN, PFO, PNA, CVA, [name].   OT comments  Pt supine in bed and agreeable to OT session.  Pt remains pleasantly confused, initially disoriented to year but able to correct with cueing and he does not remember fall night of 11/30.  Attempted short blessed test, but pt unable to repeat name/address after 3 trials so unable to score-- noted difficulty with recall, attention and sequencing.  Pt requires min guard for transfers using RW and in room mobility, cueing for safety and RW mgmt throughout session.  Min guard for LB dressing and grooming at sink.  Discussed safety and recommendations- he will need constant supervision, recommend close supervision for all mobility and ADLs, and assist for meds, meals and driving.  Pt reports spouse can be with him and assist most of the time.  IF he is unable to have frequent supervision, may need to consider short term SNF rehab.  Will follow.    Recommendations for follow up therapy are one component of a multi-disciplinary discharge planning process, led by the attending physician.  Recommendations may be updated based on patient status, additional functional criteria and insurance authorization.    Follow Up Recommendations  Home health OT    Assistance Recommended at Discharge Frequent or constant Supervision/Assistance  Equipment Recommendations  BSC/3in1;Other (comment) (RW)    Recommendations for Other Services      Precautions / Restrictions Precautions Precautions: Fall Restrictions Weight Bearing  Restrictions: No       Mobility Bed Mobility Overal bed mobility: Needs Assistance Bed Mobility: Rolling;Sidelying to Sit;Sit to Sidelying Rolling: Supervision Sidelying to sit: Supervision     Sit to sidelying: Supervision General bed mobility comments: supervision for safety, cueing for technique    Transfers Overall transfer level: Needs assistance Equipment used: Rolling walker (2 wheels) Transfers: Sit to/from Stand Sit to Stand: Min guard           General transfer comment: min guard for safety, cueing for hand placement     Balance Overall balance assessment: Needs assistance Sitting-balance support: No upper extremity supported;Feet supported Sitting balance-Leahy Scale: Good     Standing balance support: No upper extremity supported;During functional activity;Bilateral upper extremity supported Standing balance-Leahy Scale: Poor Standing balance comment: relies on BUE support dynamically                           ADL either performed or assessed with clinical judgement   ADL Overall ADL's : Needs assistance/impaired     Grooming: Min guard;Wash/dry hands;Standing Grooming Details (indicate cue type and reason): at sink             Lower Body Dressing: Min guard;Sit to/from stand Lower Body Dressing Details (indicate cue type and reason): able to don/doff B socks today, min guard in standing Toilet Transfer: Min guard;Ambulation Toilet Transfer Details (indicate cue type and reason): simulated in room, using RW         Functional mobility during ADLs: Min guard;Rolling walker (2 wheels);Cueing for safety General ADL Comments: requires cueing for RW mgmt and  safety    Extremity/Trunk Assessment              Vision   Additional Comments: difficulty locating items at sink on L and R, ? vision vs cog (otherwise vision appears [facility])   Perception     Praxis      Cognition Arousal/Alertness: Awake/alert Behavior During  Therapy: Flat affect Overall Cognitive Status: Impaired/Different from baseline Area of Impairment: Orientation;Attention;Following commands;Memory;Safety/judgement;Awareness;Problem solving                 Orientation Level: Disoriented to;Time (reports 2023, doesn't remember fall in hosptial) Current Attention Level: Sustained Memory: Decreased short-term memory Following Commands: Follows one step commands consistently;Follows one step commands with increased time Safety/Judgement: Decreased awareness of safety;Decreased awareness of deficits Awareness: Emergent Problem Solving: Slow processing;Requires verbal cues;Difficulty sequencing General Comments: pt pleasantly confused, he initially reports 2023 but able to correct with cueing and recall towards end of session.  Continues to requires cueing for simple problem solving (locating items at sink), safety.  Short blessed test attempted and pt unable to recall name/address over 3 attempts so unable to complete.          Exercises     Shoulder Instructions       General Comments RA VSS    Pertinent Vitals/ Pain       Pain Assessment: No/denies pain  Home Living                                          Prior Functioning/Environment              Frequency  Min 2X/week        Progress Toward Goals  OT Goals(current goals can now be found in the care plan section)  Progress towards OT goals: Progressing toward goals  Acute Rehab OT Goals Patient Stated Goal: to take a nap OT Goal Formulation: With patient Time For Goal Achievement: 05/20/21 Potential to Achieve Goals: Good  Plan Discharge plan remains appropriate;Frequency remains appropriate    Co-evaluation                 AM-PAC OT "6 Clicks" Daily Activity     Outcome Measure   Help from another person eating meals?: A Little Help from another person taking care of personal grooming?: A Little Help from another person  toileting, which includes using toliet, bedpan, or urinal?: A Little Help from another person bathing (including washing, rinsing, drying)?: A Little Help from another person to put on and taking off regular upper body clothing?: A Little Help from another person to put on and taking off regular lower body clothing?: A Little 6 Click Score: 18    End of Session Equipment Utilized During Treatment: Rolling walker (2 wheels)  OT Visit Diagnosis: Other abnormalities of gait and mobility (R26.89);Muscle weakness (generalized) (M62.81);Other symptoms and signs involving cognitive function;History of falling (Z91.81)   Activity Tolerance Patient tolerated treatment well   Patient Left in bed;with call bell/phone within reach;with bed alarm set   Nurse Communication Mobility status        Time: 6962-9528 OT Time Calculation (min): 21 min  Charges: OT General Charges $OT Visit: 1 Visit OT Treatments $Self Care/Home Management : 8-22 mins  Jolaine Artist, OT [place] Pager [phone] Office [place] 05/09/2021, 10:21 AM

## 2021-05-09 NOTE — Consult Note (Addendum)
[facility]    Date of Admission:  05/05/2021   Total days of inpatient antibiotics 1        Reason for Consult: MRSA bacteremia    Principal Problem:   Hyperkalemia Active Problems:   ESRD (end stage renal disease) (HCC)   Hypertension   COPD (chronic obstructive pulmonary disease) (HCC)   Elevated LFTs   Hypoglycemia   Thrombocytopenia (HCC)   Fall at home, initial encounter   Bacteremia due to Gram-positive bacteria   Acute metabolic encephalopathy   Assessment: 65 YM with ESRD on HD admitted with hyperkalemia(Hx of non-adherence to dialysis) and elevated LFTs after a fall at home. He  was planned for discharge until he became confused.  MRI brain showed trace subdural hematoma. Found to have MRSA bacteremia.  #MRSA bacteremia in pt on dialysis via right AVF #Elevated LFTs-trending down -TTE did not show vegetation. Would consider TEE in this pt as it is unclear how long he has been bacteremic. Recommendations:  -Obtain repeat blood Cx -Continue vancomycin -Given ESRD and iHD port of entry was likely his fistula. Obtain TEE -Plan to verify Hx of access for dyalisis Microbiology:   Antibiotics:  12/1-p  Vancomycin Cultures: Blood 12/01 1/2 GPC, MRSA and staph epi on BCID   HPI: Albert Harris is a 65 y.o. male with ESRD on iHD, COPD, PVD, HTN, PFO presented to ED after a fall at home. Pt reports he had slipped and hit the edge of his nightstand while walking to his bed. . Found ot have K of 7.0 elevated LFTs and glc 43. Pt admitted for hyperkalemia.  He clinically improved and planned ot discharge after HD on 11/130. He became confused and discharge was canceled. Blood Cx obtained which grew MRSA. Pt was started on vancomycin and ID consulted.  Today he is resting in  bed. He denies any past history of MRSA kor bacteremia. He denies any hardware including cardiac devises. Denies any rashes.   Review of Systems: Review of Systems   All other systems reviewed and are negative.  Past Medical History:  Diagnosis Date   Anemia    Asthma    Carotid stenosis 09/12/2019   Chronic kidney disease    t, th. sat dialysis   Claudication in peripheral vascular disease ([place]) 12/15/1503   Complication of anesthesia    " i HAVE A HARD TIME WAKING UP " (only one time)   COPD (chronic obstructive pulmonary disease) ([place])    COVID-19    positive on  06/16/20   GERD (gastroesophageal reflux disease)    HLD (hyperlipidemia)    Hypertension    Peripheral vascular disease (HCC)    PFO with atrial septal aneurysm    by echo 11/2017   Pneumonia    Stroke ([place])    [name] (transient ischemic attack) 11/2017   Tobacco abuse    quit 07/17/19    Social History   Tobacco Use   Smoking status: Former    Packs/day: 1.00    Years: 50.00    Pack years: 50.00    Types: Cigarettes    Quit date: 07/17/2019    Years since quitting: 1.8   Smokeless tobacco: Never  Vaping Use   Vaping Use: Never used  Substance Use Topics   Alcohol use: Not Currently    Alcohol/week: 1.0 - 3.0 standard drink    Types: 1 - 3 Cans of beer per week  Drug use: No    Family History  Problem Relation Age of Onset   Hypertension Mother    Colon cancer Father    Stomach cancer Brother    Scheduled Meds:  calcium carbonate  1 tablet Oral TID WC   Chlorhexidine Gluconate Cloth  6 each Topical Q0600   famotidine  20 mg Oral Daily   heparin  5,000 Units Subcutaneous Q8H   lidocaine  1 patch Transdermal Q24H   LORazepam  1 mg Intravenous Once   metoprolol tartrate  25 mg Oral BID   sodium chloride flush  3 mL Intravenous Q12H   Continuous Infusions:  [START ON 05/10/2021] vancomycin     PRN Meds:.albuterol, dextrose, haloperidol **OR** haloperidol lactate, labetalol, ondansetron **OR** ondansetron (ZOFRAN) IV, senna-docusate No Known Allergies  OBJECTIVE: Blood pressure (!) 150/77, pulse 62, temperature 97.9 F (36.6 C), temperature source Oral, resp.  rate 20, weight 60 kg, SpO2 95 %.  Physical Exam Constitutional:      General: He is not in acute distress.    Appearance: He is normal weight. He is not toxic-appearing.  HENT:     Head: Normocephalic and atraumatic.     Right Ear: External ear normal.     Left Ear: External ear normal.     Nose: No congestion or rhinorrhea.     Mouth/Throat:     Mouth: Mucous membranes are moist.     Pharynx: Oropharynx is clear.  Eyes:     Extraocular Movements: Extraocular movements intact.     Conjunctiva/sclera: Conjunctivae normal.     Pupils: Pupils are equal, round, and reactive to light.  Cardiovascular:     Rate and Rhythm: Normal rate and regular rhythm.     Heart sounds: No murmur heard.   No friction rub. No gallop.     Comments: Right forearm access Pulmonary:     Effort: Pulmonary effort is normal.     Breath sounds: Normal breath sounds.  Abdominal:     General: Abdomen is flat. Bowel sounds are normal.     Palpations: Abdomen is soft.  Musculoskeletal:        General: No swelling. Normal range of motion.     Cervical back: Normal range of motion and neck supple.  Skin:    General: Skin is warm and dry.  Neurological:     General: No focal deficit present.     Mental Status: He is oriented to person, place, and time.  Psychiatric:        Mood and Affect: Mood normal.    Lab Results Lab Results  Component Value Date   WBC 7.2 05/08/2021   HGB 18.3 (H) 05/08/2021   HCT 58.6 (H) 05/08/2021   MCV 98.5 05/08/2021   PLT 67 (L) 05/08/2021    Lab Results  Component Value Date   CREATININE 5.10 (H) 05/09/2021   BUN 24 (H) 05/09/2021   NA 136 05/09/2021   K 4.1 05/09/2021   CL 95 (L) 05/09/2021   CO2 26 05/09/2021    Lab Results  Component Value Date   ALT 1,066 (H) 05/09/2021   AST 218 (H) 05/09/2021   ALKPHOS 108 05/09/2021   BILITOT 3.5 (H) 05/09/2021       Laurice Record, [facility] 05/09/2021,  9:03 PM

## 2021-05-09 NOTE — Progress Notes (Signed)
  Echocardiogram 2D Echocardiogram has been performed.  Albert Harris F 05/09/2021, 3:55 PM

## 2021-05-09 NOTE — Progress Notes (Addendum)
Pt's chart reviewed. Noted possible d/c tomorrow pending results of blood cultures. Contacted [facility] and spoke to [place], Therapist, sports. If pt can arrive to clinic by 10:30 (pt's normal arrival time), pt can receive treatment at out-pt clinic to avoid HD on day of d/c. Update provided to attending and renal PA. Also contacted inpt HD unit to request that pt be placed on 2nd shift in case pt is unable to arrive at clinic by 10:30 to receive treatment as out-pt.    Melven Sartorius Renal Navigator [phone]

## 2021-05-10 DIAGNOSIS — R7881 Bacteremia: Secondary | ICD-10-CM | POA: Diagnosis not present

## 2021-05-10 DIAGNOSIS — G9341 Metabolic encephalopathy: Secondary | ICD-10-CM | POA: Diagnosis not present

## 2021-05-10 DIAGNOSIS — N186 End stage renal disease: Secondary | ICD-10-CM

## 2021-05-10 LAB — COMPREHENSIVE METABOLIC PANEL
ALT: 664 U/L — ABNORMAL HIGH (ref 0–44)
AST: 62 U/L — ABNORMAL HIGH (ref 15–41)
Albumin: 2.9 g/dL — ABNORMAL LOW (ref 3.5–5.0)
Alkaline Phosphatase: 103 U/L (ref 38–126)
Anion gap: 16 — ABNORMAL HIGH (ref 5–15)
BUN: 48 mg/dL — ABNORMAL HIGH (ref 8–23)
CO2: 22 mmol/L (ref 22–32)
Calcium: 8 mg/dL — ABNORMAL LOW (ref 8.9–10.3)
Chloride: 102 mmol/L (ref 98–111)
Creatinine, Ser: 7.87 mg/dL — ABNORMAL HIGH (ref 0.61–1.24)
GFR, Estimated: 7 mL/min — ABNORMAL LOW (ref >60)
Glucose, Bld: 114 mg/dL — ABNORMAL HIGH (ref 70–99)
Potassium: 3.9 mmol/L (ref 3.5–5.1)
Sodium: 140 mmol/L (ref 135–145)
Total Bilirubin: 2.1 mg/dL — ABNORMAL HIGH (ref 0.3–1.2)
Total Protein: 5.6 g/dL — ABNORMAL LOW (ref 6.5–8.1)

## 2021-05-10 LAB — CBC
HCT: 34.1 % — ABNORMAL LOW (ref 39.0–52.0)
Hemoglobin: 11.1 g/dL — ABNORMAL LOW (ref 13.0–17.0)
MCH: 31.6 pg (ref 26.0–34.0)
MCHC: 32.6 g/dL (ref 30.0–36.0)
MCV: 97.2 fL (ref 80.0–100.0)
Platelets: 67 10*3/uL — ABNORMAL LOW (ref 150–400)
RBC: 3.51 MIL/uL — ABNORMAL LOW (ref 4.22–5.81)
RDW: 16.3 % — ABNORMAL HIGH (ref 11.5–15.5)
WBC: 7.4 10*3/uL (ref 4.0–10.5)
nRBC: 0.4 % — ABNORMAL HIGH (ref 0.0–0.2)

## 2021-05-10 MED ORDER — VANCOMYCIN HCL 750 MG/150ML IV SOLN
INTRAVENOUS | Status: AC
  Administered 2021-05-10: 750 mg
  Filled 2021-05-10: qty 150

## 2021-05-10 MED ORDER — CALCIUM ACETATE (PHOS BINDER) 667 MG PO CAPS
1334.0000 mg | ORAL_CAPSULE | Freq: Three times a day (TID) | ORAL | Status: DC
  Administered 2021-05-10 – 2021-05-12 (×7): 1334 mg via ORAL
  Filled 2021-05-10 (×7): qty 2

## 2021-05-10 NOTE — Progress Notes (Signed)
PROGRESS NOTE    Albert Harris  UYQ:[id number] DOB: 1956-05-15 DOA: 05/05/2021 PCP: Kerin Perna, NP    Brief Narrative:   65 year old gentleman with history of ESRD on hemodialysis, COPD, peripheral vascular disease, hypertension, hyperlipidemia and severe noncompliance to dialysis regimen presented with fall at home.  Not going to hemodialysis since last 1 week.  Not eating well since Thanksgiving as per family.  In the emergency room hemodynamically stable.  Potassium 7. AST and ALT 2428/2294 with bilirubin of 2.  Urgently hemodialyzed.  Skeletal survey negative.  Patient has clinically improved and prepared to discharge after hemodialysis on 11/30.  He became very confused and combative so discharge was canceled.  Patient remained encephalopathic overnight with no obvious known cause. 12/2, most of the delirium improved now.   -Blood culture growing MRSA.     Assessment & Plan:   Principal Problem:   Hyperkalemia Active Problems:   ESRD (end stage renal disease) (HCC)   Hypertension   COPD (chronic obstructive pulmonary disease) (HCC)   Elevated LFTs   Hypoglycemia   Thrombocytopenia (HCC)   Fall at home, initial encounter   Bacteremia due to Gram-positive bacteria   Acute metabolic encephalopathy  Acute metabolic encephalopathy: Developed in the hospital.  Primary cause unknown. -No drugs or alcohol -CT head was normal.  Subsequent MRI with a small subdural possibly happened after a fall in the bathroom but no other complications. -Ammonia, salicylate, Tylenol levels normal.  Blood gases essentially normal. -LFTs are already improving and downtrending. -Adequately stabilizing.  Continue fall precautions delirium precautions.  Avoid any benzodiazepines or opiates. -Patient has improved, back to baseline  MRSA bacteremia -Follow on repeat blood cultures, meanwhile keep empirically on IV vancomycin, I have discussed with cardiology to arrange for TEE, very likely  on Monday.  ESRD on hemodialysis, missed hemodialysis resulting in hyperkalemia: Treated with temporizing measures with calcium gluconate, insulin and dextrose infusion as well bicarbonate. Patient underwent consecutive dialysis sessions with improvement. Next dialysis tomorrow.   Elevated LFTs: Consistent with shock liver.  CT scan of the abdomen pelvis with no obvious etiology or abnormalities. Hepatitis panel negative. Tylenol and salicylate levels are normal. Right upper quadrant ultrasound is essentially normal with unremarkable gallbladder and bile duct. Continue monitoring to ensure stabilization.   Hypoglycemia: Due to poor oral intake and insulin use per correction of hyperkalemia.  Regular diet.  Blood sugars stabilized after eating regular diet.  COPD: Stable.  Albuterol as needed.   Fall: Patient has right lateral rib pain and tenderness that is reproducible.  Skeletal survey negative.  Local lidocaine patch. -Patient with very small some subdural tentorial bleed, discussed with neurosurgery Dr. Kathyrn Sheriff, patient is asymptomatic, no contraindication for subcu heparin for DVT prophylaxis.   Continue attempt to mobilize.  Monitor for positive blood cultures.  Anticipate discharge home tomorrow if contaminant cultures.  DVT prophylaxis: heparin injection 5,000 Units Start: 05/06/21 0000   Code Status: Full code Family Communication: Wife on the phone. Disposition Plan: Status is: Inpatient  Remains inpatient appropriate because: Altered mental status, positive blood cultures.         Consultants:  Nephrology  Procedures:  Hemodialysis  Antimicrobials:  Vancomycin 12/1---   Subjective:  She denies any complaints, no significant events overnight.  Objective: Vitals:   05/10/21 1400 05/10/21 1430 05/10/21 1500 05/10/21 1530  BP: (!) 167/87 (!) 158/74 (!) 177/90 (!) 135/92  Pulse:      Resp: 15 (!) 23 (!) 24 (!) 21  Temp:  TempSrc:      SpO2:       Weight:       No intake or output data in the 24 hours ending 05/10/21 1552  Filed Weights   05/08/21 1611 05/09/21 0500 05/10/21 1339  Weight: 60.3 kg 60 kg 60.7 kg    Examination:  Awake Alert, Oriented X 3, No new F.N deficits, Normal affect Symmetrical Chest wall movement, Good air movement bilaterally, CTAB RRR,No Gallops,Rubs or new Murmurs, No Parasternal Heave +ve B.Sounds, Abd Soft, No tenderness, No rebound - guarding or rigidity. No Cyanosis, Clubbing or edema, No new Rash or bruise       Data Reviewed: I have personally reviewed following labs and imaging studies  CBC: Recent Labs  Lab 05/05/21 1534 05/06/21 0100 05/06/21 0300 05/08/21 1435 05/08/21 2048 05/10/21 1330  WBC 13.0* 11.7* 11.8* 7.8 7.2 7.4  NEUTROABS 10.5*  --   --   --   --   --   HGB 12.0* 10.7* 11.0* 12.7* 18.3* 11.1*  HCT 38.5* 34.1* 34.0* 39.8 58.6* 34.1*  MCV 101.3* 100.3* 98.0 96.8 98.5 97.2  PLT 102* 97* 94* 80* 67* 67*   Basic Metabolic Panel: Recent Labs  Lab 05/06/21 0300 05/07/21 0217 05/08/21 0241 05/09/21 0303 05/10/21 0606  NA 139 135 135 136 140  K 6.0* 5.7* 5.3* 4.1 3.9  CL 95* 95* 96* 95* 102  CO2 22 22 19* 26 22  GLUCOSE 84 90 76 74 114*  BUN 107* 54* 30* 24* 48*  CREATININE 16.65* 11.01* 6.59* 5.10* 7.87*  CALCIUM 9.4 9.5 8.6* 8.1* 8.0*  PHOS 11.3*  --   --   --   --    GFR: Estimated Creatinine Clearance: 7.9 mL/min (A) (by C-G formula based on SCr of 7.87 mg/dL (H)). Liver Function Tests: Recent Labs  Lab 05/07/21 0217 05/07/21 0954 05/08/21 0241 05/09/21 0303 05/10/21 0606  AST QUANTITY NOT SUFFICIENT, UNABLE TO PERFORM TEST 544* 462* 218* 62*  ALT QUANTITY NOT SUFFICIENT, UNABLE TO PERFORM TEST 1,563* 1,356* 1,066* 664*  ALKPHOS 120 126 119 108 103  BILITOT QUANTITY NOT SUFFICIENT, UNABLE TO PERFORM TEST 2.1* 2.8* 3.5* 2.1*  PROT 5.7* 6.2* 5.8* 6.0* 5.6*  ALBUMIN 3.1* 3.2* 3.1* 3.1* 2.9*   No results for input(s): LIPASE, AMYLASE in the last  168 hours. Recent Labs  Lab 05/08/21 1020  AMMONIA 36*   Coagulation Profile: Recent Labs  Lab 05/06/21 0100 05/09/21 0303  INR 2.8* 1.3*   Cardiac Enzymes: No results for input(s): CKTOTAL, CKMB, CKMBINDEX, TROPONINI in the last 168 hours. BNP (last 3 results) No results for input(s): PROBNP in the last 8760 hours. HbA1C: No results for input(s): HGBA1C in the last 72 hours. CBG: Recent Labs  Lab 05/06/21 0739 05/06/21 0804 05/06/21 0833 05/06/21 1822 05/07/21 1842  GLUCAP 58* 98 93 111* 85   Lipid Profile: No results for input(s): CHOL, HDL, LDLCALC, TRIG, CHOLHDL, LDLDIRECT in the last 72 hours. Thyroid Function Tests: No results for input(s): TSH, T4TOTAL, FREET4, T3FREE, THYROIDAB in the last 72 hours. Anemia Panel: No results for input(s): VITAMINB12, FOLATE, FERRITIN, TIBC, IRON, RETICCTPCT in the last 72 hours. Sepsis Labs: No results for input(s): PROCALCITON, LATICACIDVEN in the last 168 hours.  Recent Results (from the past 240 hour(s))  Resp Panel by RT-PCR (Flu A&B, Covid) Nasopharyngeal Swab     Status: None   Collection Time: 05/05/21 10:42 PM   Specimen: Nasopharyngeal Swab; Nasopharyngeal(NP) swabs in vial transport medium  Result Value Ref  Range Status   SARS Coronavirus 2 by RT PCR NEGATIVE NEGATIVE Final    Comment: (NOTE) SARS-CoV-2 target nucleic acids are NOT DETECTED.  The SARS-CoV-2 RNA is generally detectable in upper respiratory specimens during the acute phase of infection. The lowest concentration of SARS-CoV-2 viral copies this assay can detect is 138 copies/mL. A negative result does not preclude SARS-Cov-2 infection and should not be used as the sole basis for treatment or other patient management decisions. A negative result may occur with  improper specimen collection/handling, submission of specimen other than nasopharyngeal swab, presence of viral mutation(s) within the areas targeted by this assay, and inadequate number of  viral copies(<138 copies/mL). A negative result must be combined with clinical observations, patient history, and epidemiological information. The expected result is Negative.  Fact Sheet for Patients:  EntrepreneurPulse.com.au  Fact Sheet for Healthcare Providers:  IncredibleEmployment.be  This test is no t yet approved or cleared by the [place] FDA and  has been authorized for detection and/or diagnosis of SARS-CoV-2 by FDA under an Emergency Use Authorization (EUA). This EUA will remain  in effect (meaning this test can be used) for the duration of the COVID-19 declaration under Section 564(b)(1) of the Act, 21 U.S.C.section 360bbb-3(b)(1), unless the authorization is terminated  or revoked sooner.       Influenza A by PCR NEGATIVE NEGATIVE Final   Influenza B by PCR NEGATIVE NEGATIVE Final    Comment: (NOTE) The Xpert Xpress SARS-CoV-2/FLU/RSV plus assay is intended as an aid in the diagnosis of influenza from Nasopharyngeal swab specimens and should not be used as a sole basis for treatment. Nasal washings and aspirates are unacceptable for Xpert Xpress SARS-CoV-2/FLU/RSV testing.  Fact Sheet for Patients: EntrepreneurPulse.com.au  Fact Sheet for Healthcare Providers: IncredibleEmployment.be  This test is not yet approved or cleared by the [place] FDA and has been authorized for detection and/or diagnosis of SARS-CoV-2 by FDA under an Emergency Use Authorization (EUA). This EUA will remain in effect (meaning this test can be used) for the duration of the COVID-19 declaration under Section 564(b)(1) of the Act, 21 U.S.C. section 360bbb-3(b)(1), unless the authorization is terminated or revoked.  Performed at [facility], [place] [address], [place], [place] [address]   Culture, blood (routine x 2)     Status: Abnormal (Preliminary result)   Collection Time: 05/08/21 10:33 AM    Specimen: BLOOD  Result Value Ref Range Status   Specimen Description BLOOD LEFT ANTECUBITAL  Final   Special Requests   Final    BOTTLES DRAWN AEROBIC AND ANAEROBIC Blood Culture results may not be optimal due to an inadequate volume of blood received in culture bottles   Culture  Setup Time   Final    GRAM POSITIVE COCCI IN BOTH AEROBIC AND ANAEROBIC BOTTLES CRITICAL RESULT CALLED TO, READ BACK BY AND VERIFIED WITH: PHARMD JAMES LEDFORD 05/09/21@4 :55 BY TW Performed at [facility], [place] [address] [place], [place] [address]    Culture (A)  Final    STAPHYLOCOCCUS AUREUS SUSCEPTIBILITIES TO FOLLOW STAPHYLOCOCCUS EPIDERMIDIS    Report Status PENDING  Incomplete  Blood Culture ID Panel (Reflexed)     Status: Abnormal   Collection Time: 05/08/21 10:33 AM  Result Value Ref Range Status   Enterococcus faecalis NOT DETECTED NOT DETECTED Final   Enterococcus Faecium NOT DETECTED NOT DETECTED Final   Listeria monocytogenes NOT DETECTED NOT DETECTED Final   Staphylococcus species DETECTED (A) NOT DETECTED Final  Comment: CRITICAL RESULT CALLED TO, READ BACK BY AND VERIFIED WITH: PHARMD JAMES LEDFORD 05/09/21@4 :55 BY TW    Staphylococcus aureus (BCID) DETECTED (A) NOT DETECTED Final    Comment: Methicillin (oxacillin)-resistant Staphylococcus aureus (MRSA). MRSA is predictably resistant to beta-lactam antibiotics (except ceftaroline). Preferred therapy is vancomycin unless clinically contraindicated. Patient requires contact precautions if  hospitalized. CRITICAL RESULT CALLED TO, READ BACK BY AND VERIFIED WITH: PHARMD JAMES LEDFORD 05/09/21@4 :55 BY TW    Staphylococcus epidermidis DETECTED (A) NOT DETECTED Final    Comment: CRITICAL RESULT CALLED TO, READ BACK BY AND VERIFIED WITH: PHARMD JAMES LEDFORD 05/09/21@4 :55 BY TW    Staphylococcus lugdunensis NOT DETECTED NOT DETECTED Final   Streptococcus species NOT DETECTED NOT DETECTED Final   Streptococcus agalactiae NOT DETECTED  NOT DETECTED Final   Streptococcus pneumoniae NOT DETECTED NOT DETECTED Final   Streptococcus pyogenes NOT DETECTED NOT DETECTED Final   A.calcoaceticus-baumannii NOT DETECTED NOT DETECTED Final   Bacteroides fragilis NOT DETECTED NOT DETECTED Final   Enterobacterales NOT DETECTED NOT DETECTED Final   Enterobacter cloacae complex NOT DETECTED NOT DETECTED Final   Escherichia coli NOT DETECTED NOT DETECTED Final   Klebsiella aerogenes NOT DETECTED NOT DETECTED Final   Klebsiella oxytoca NOT DETECTED NOT DETECTED Final   Klebsiella pneumoniae NOT DETECTED NOT DETECTED Final   Proteus species NOT DETECTED NOT DETECTED Final   Salmonella species NOT DETECTED NOT DETECTED Final   Serratia marcescens NOT DETECTED NOT DETECTED Final   Haemophilus influenzae NOT DETECTED NOT DETECTED Final   Neisseria meningitidis NOT DETECTED NOT DETECTED Final   Pseudomonas aeruginosa NOT DETECTED NOT DETECTED Final   Stenotrophomonas maltophilia NOT DETECTED NOT DETECTED Final   Candida albicans NOT DETECTED NOT DETECTED Final   Candida auris NOT DETECTED NOT DETECTED Final   Candida glabrata NOT DETECTED NOT DETECTED Final   Candida krusei NOT DETECTED NOT DETECTED Final   Candida parapsilosis NOT DETECTED NOT DETECTED Final   Candida tropicalis NOT DETECTED NOT DETECTED Final   Cryptococcus neoformans/gattii NOT DETECTED NOT DETECTED Final   Methicillin resistance mecA/C DETECTED (A) NOT DETECTED Final    Comment: CRITICAL RESULT CALLED TO, READ BACK BY AND VERIFIED WITH: PHARMD JAMES LEDFORD 05/09/21@4 :55 BY TW    Meth resistant mecA/C and MREJ DETECTED (A) NOT DETECTED Final    Comment: CRITICAL RESULT CALLED TO, READ BACK BY AND VERIFIED WITH: PHARMD JAMES LEDFORD 05/09/21@4 :55 BY TW Performed at [facility], [place] [address], [place], [place] [address]   Culture, blood (routine x 2)     Status: None (Preliminary result)   Collection Time: 05/08/21 10:40 AM   Specimen: BLOOD LEFT HAND  Result  Value Ref Range Status   Specimen Description BLOOD LEFT HAND  Final   Special Requests   Final    BOTTLES DRAWN AEROBIC ONLY Blood Culture results may not be optimal due to an inadequate volume of blood received in culture bottles   Culture   Final    NO GROWTH 2 DAYS Performed at [facility], [place] [name], [place], [place] [address]    Report Status PENDING  Incomplete  Culture, blood (routine x 2)     Status: None (Preliminary result)   Collection Time: 05/09/21  2:43 PM   Specimen: BLOOD LEFT HAND  Result Value Ref Range Status   Specimen Description BLOOD LEFT HAND  Final   Special Requests   Final    BOTTLES DRAWN AEROBIC ONLY Blood Culture results may not be optimal  due to an inadequate volume of blood received in culture bottles   Culture   Final    NO GROWTH < 24 HOURS Performed at [place] [address] [place] [address], [place], [place] [address]    Report Status PENDING  Incomplete  Culture, blood (routine x 2)     Status: None (Preliminary result)   Collection Time: 05/09/21  2:51 PM   Specimen: BLOOD LEFT HAND  Result Value Ref Range Status   Specimen Description BLOOD LEFT HAND  Final   Special Requests   Final    BOTTLES DRAWN AEROBIC ONLY Blood Culture results may not be optimal due to an inadequate volume of blood received in culture bottles   Culture   Final    NO GROWTH < 24 HOURS Performed at [facility], [place] [address] [place], [place] [address]    Report Status PENDING  Incomplete         Radiology Studies: MR BRAIN WO CONTRAST  Addendum Date: 05/09/2021   ADDENDUM REPORT: 05/09/2021 12:04 ADDENDUM: Impression #2 called by telephone at the time of interpretation on 05/09/2021 at 12:03 pm to provider [facility] , who verbally acknowledged these results. Electronically Signed   By: Kellie Simmering D.O.   On: 05/09/2021 12:04   Result Date: 05/09/2021 CLINICAL DATA:  Mental status change, persistent or worsening; sudden onset delirium. EXAM:  MRI HEAD WITHOUT CONTRAST TECHNIQUE: Multiplanar, multiecho pulse sequences of the brain and surrounding structures were obtained without intravenous contrast. COMPARISON:  Prior head CT examinations 05/08/2021 and earlier. MRI brain and MRA head 11/10/2017. FINDINGS: Brain: Intermittently motion degraded examination, limiting evaluation. Most notably, there is mild-to-moderate motion degradation of the axial T2 TSE sequence, moderate/severe motion degradation of the axial T2 FLAIR sequence and moderate motion degradation of the coronal T2 TSE sequence. Mild-to-moderate generalized cerebral atrophy. Comparatively mild cerebellar atrophy. Redemonstrated chronic small-vessel infarct within the right basal ganglia/external capsule. Background mild multifocal T2 FLAIR hyperintense signal abnormality within the cerebral white matter, nonspecific but compatible with chronic small vessel ischemic disease. Chronic microhemorrhages within the right frontal, parietal and temporal lobes, new from the prior MRI (3 total). Asymmetric T2* signal loss along the left tentorium, suspicious for a trace subdural hematoma. In retrospect, there was trace asymmetric hyperdensity along the left tentorium on the head CT of 05/08/2021. There is no acute infarct. No evidence of an intracranial mass. No extra-axial fluid collection. No midline shift. Vascular: Signal abnormality within portions of the V4 right vertebral artery suggesting high-grade stenosis or vessel occlusion. Flow voids otherwise maintained within the proximal large arterial vessels. Skull and upper cervical spine: No focal suspicious marrow lesion. Diffuse osseous heterogeneity, likely related to end-stage renal disease. Sinuses/Orbits: Visualized orbits show no acute finding. Minimal scattered paranasal sinus mucosal thickening at the imaged levels. Other: Trace fluid within the left mastoid air cells. IMPRESSION: Intermittently motion degraded examination, as described.  Suspected trace acute subdural hematoma layering along the left tentorium. No evidence of acute infarction. Redemonstrated chronic small-vessel infarct within the right basal ganglia/external capsule. Background mild chronic small vessel ischemic changes within the cerebral white matter, progressed from the brain MRI of 11/10/2017. Mild-to-moderate generalized cerebral atrophy. Comparatively mild cerebellar atrophy. Signal abnormality within the intracranial right vertebral artery, suggesting high-grade stenosis or vessel occlusion. This is a new finding as compared to the MRI/MRA of 11/10/2017. Trace left mastoid effusion. Electronically Signed: By: Kellie Simmering D.O. On: 05/09/2021 11:56   ECHOCARDIOGRAM COMPLETE  Result Date: 05/09/2021  ECHOCARDIOGRAM REPORT   Patient Name:   Albert Harris Date of Exam: 05/09/2021 Medical Rec #:  [id number]      Height:       68.0 in Accession #:    [id number]     Weight:       132.3 lb Date of Birth:  Aug 14, 1955     BSA:          1.714 m Patient Age:    2 years       BP:           135/71 mmHg Patient Gender: M              HR:           62 bpm. Exam Location:  Inpatient Procedure: 2D Echo, Cardiac Doppler and Color Doppler Indications:    Bacteremia  History:        Patient has no prior history of Echocardiogram examinations and                 Patient has prior history of Echocardiogram examinations, most                 recent 07/18/2019. CHF; Risk Factors:Hypertension and Current                 Smoker.  Sonographer:    Merrie Roof RDCS Referring Phys: [id number] [place]  1. Left ventricular ejection fraction, by estimation, is 40 to 45%. The left ventricle has mildly decreased function. The left ventricle demonstrates global hypokinesis. There is moderate left ventricular hypertrophy. Left ventricular diastolic parameters are consistent with Grade II diastolic dysfunction (pseudonormalization). Elevated left atrial pressure.  2. Right ventricular systolic  function is mildly reduced. The right ventricular size is mildly enlarged. There is moderately elevated pulmonary artery systolic pressure. The estimated right ventricular systolic pressure is 30.8 mmHg.  3. Left atrial size was severely dilated.  4. Right atrial size was severely dilated.  5. The mitral valve is normal in structure. Mild mitral valve regurgitation. No evidence of mitral stenosis.  6. The aortic valve was not well visualized. Aortic valve regurgitation is not visualized. Aortic valve sclerosis/calcification is present, without any evidence of aortic stenosis.  7. The inferior vena cava is dilated in size with <50% respiratory variability, suggesting right atrial pressure of 15 mmHg. Conclusion(s)/Recommendation(s): No evidence of valvular vegetations on this transthoracic echocardiogram. Consider a transesophageal echocardiogram to exclude infective endocarditis if clinically indicated. FINDINGS  Left Ventricle: Left ventricular ejection fraction, by estimation, is 40 to 45%. The left ventricle has mildly decreased function. The left ventricle demonstrates global hypokinesis. The left ventricular internal cavity size was normal in size. There is  moderate left ventricular hypertrophy. Left ventricular diastolic parameters are consistent with Grade II diastolic dysfunction (pseudonormalization). Elevated left atrial pressure. Right Ventricle: The right ventricular size is mildly enlarged. Right vetricular wall thickness was not well visualized. Right ventricular systolic function is mildly reduced. There is moderately elevated pulmonary artery systolic pressure. The tricuspid  regurgitant velocity is 2.97 m/s, and with an assumed right atrial pressure of 15 mmHg, the estimated right ventricular systolic pressure is 65.7 mmHg. Left Atrium: Left atrial size was severely dilated. Right Atrium: Right atrial size was severely dilated. Pericardium: There is no evidence of pericardial effusion. Mitral  Valve: The mitral valve is normal in structure. Mild mitral valve regurgitation. No evidence of mitral valve stenosis. Tricuspid Valve: The tricuspid valve is normal in structure. Tricuspid valve regurgitation  is trivial. Aortic Valve: The aortic valve was not well visualized. Aortic valve regurgitation is not visualized. Aortic valve sclerosis/calcification is present, without any evidence of aortic stenosis. Aortic valve mean gradient measures 6.0 mmHg. Aortic valve peak gradient measures 13.4 mmHg. Aortic valve area, by VTI measures 1.92 cm. Pulmonic Valve: The pulmonic valve was not well visualized. Pulmonic valve regurgitation is not visualized. Aorta: The aortic root and ascending aorta are structurally normal, with no evidence of dilitation. Venous: The inferior vena cava is dilated in size with less than 50% respiratory variability, suggesting right atrial pressure of 15 mmHg. IAS/Shunts: The interatrial septum was not well visualized.  LEFT VENTRICLE PLAX 2D LVIDd:         5.00 cm   Diastology LVIDs:         4.00 cm   LV e' medial:    3.08 cm/s LV PW:         1.40 cm   LV E/e' medial:  27.8 LV IVS:        1.40 cm   LV e' lateral:   6.23 cm/s LVOT diam:     2.10 cm   LV E/e' lateral: 13.8 LV SV:         66 LV SV Index:   38 LVOT Area:     3.46 cm  RIGHT VENTRICLE            IVC RV Basal diam:  4.60 cm    IVC diam: 2.10 cm RV Mid diam:    3.00 cm RV S prime:     8.33 cm/s TAPSE (M-mode): 1.6 cm LEFT ATRIUM              Index        RIGHT ATRIUM           Index LA diam:        3.90 cm  2.27 cm/m   RA Area:     26.60 cm LA Vol (A2C):   129.0 ml 75.24 ml/m  RA Volume:   96.00 ml  56.00 ml/m LA Vol (A4C):   74.6 ml  43.51 ml/m LA Biplane Vol: 98.0 ml  57.16 ml/m  AORTIC VALVE AV Area (Vmax):    2.33 cm AV Area (Vmean):   2.22 cm AV Area (VTI):     1.92 cm AV Vmax:           183.00 cm/s AV Vmean:          111.000 cm/s AV VTI:            0.342 m AV Peak Grad:      13.4 mmHg AV Mean Grad:      6.0 mmHg  LVOT Vmax:         123.00 cm/s LVOT Vmean:        71.300 cm/s LVOT VTI:          0.190 m LVOT/AV VTI ratio: 0.56  AORTA Ao Root diam: 3.10 cm Ao Asc diam:  3.00 cm MITRAL VALVE               TRICUSPID VALVE MV Area (PHT): 2.69 cm    TR Peak grad:   35.3 mmHg MV Decel Time: 282 msec    TR Vmax:        297.00 cm/s MV E velocity: 85.70 cm/s MV A velocity: 51.60 cm/s  SHUNTS MV E/A ratio:  1.66        Systemic VTI:  0.19 m  Systemic Diam: 2.10 cm [name] MD Electronically signed by [name] MD Signature Date/Time: 05/09/2021/5:51:14 PM    Final         Scheduled Meds:  calcium acetate  1,334 mg Oral TID WC   calcium carbonate  1 tablet Oral TID WC   Chlorhexidine Gluconate Cloth  6 each Topical Q0600   famotidine  20 mg Oral Daily   heparin  5,000 Units Subcutaneous Q8H   lidocaine  1 patch Transdermal Q24H   LORazepam  1 mg Intravenous Once   metoprolol tartrate  25 mg Oral BID   sodium chloride flush  3 mL Intravenous Q12H   Continuous Infusions:  vancomycin     vancomycin       LOS: 5 days    Time spent: 30 minutes    Phillips Climes, MD [facility] Pager [phone]

## 2021-05-10 NOTE — Progress Notes (Signed)
[facility] Progress Note   Subjective:  Seen in room - appears comfortable. Denies CP/dyspnea. Blood Cx + MRSA, ID consulted, underwent TTE without vegetations noted.   Objective Vitals:   05/10/21 0032 05/10/21 0450 05/10/21 0825 05/10/21 1158  BP: 131/78 (!) 159/84 (!) 109/59 (!) 150/78  Pulse: 72 83 68 67  Resp: 20 18 17 16   Temp:  98 F (36.7 C) 98.2 F (36.8 C) 97.9 F (36.6 C)  TempSrc: Oral Oral Oral Oral  SpO2:   97% 95%  Weight:       Physical Exam General: Chronically ill appearing man, NAD Heart: RRR; no murmur Lungs: CTAB Abdomen: soft Extremities: No LE edema Dialysis Access: R forearm AVF + bruit    Additional Objective Labs: Basic Metabolic Panel: Recent Labs  Lab 05/06/21 0300 05/07/21 0217 05/08/21 0241 05/09/21 0303 05/10/21 0606  NA 139   < > 135 136 140  K 6.0*   < > 5.3* 4.1 3.9  CL 95*   < > 96* 95* 102  CO2 22   < > 19* 26 22  GLUCOSE 84   < > 76 74 114*  BUN 107*   < > 30* 24* 48*  CREATININE 16.65*   < > 6.59* 5.10* 7.87*  CALCIUM 9.4   < > 8.6* 8.1* 8.0*  PHOS 11.3*  --   --   --   --    < > = values in this interval not displayed.   Liver Function Tests: Recent Labs  Lab 05/08/21 0241 05/09/21 0303 05/10/21 0606  AST 462* 218* 62*  ALT 1,356* 1,066* 664*  ALKPHOS 119 108 103  BILITOT 2.8* 3.5* 2.1*  PROT 5.8* 6.0* 5.6*  ALBUMIN 3.1* 3.1* 2.9*    CBC: Recent Labs  Lab 05/05/21 1534 05/06/21 0100 05/06/21 0300 05/08/21 1435 05/08/21 2048  WBC 13.0* 11.7* 11.8* 7.8 7.2  NEUTROABS 10.5*  --   --   --   --   HGB 12.0* 10.7* 11.0* 12.7* 18.3*  HCT 38.5* 34.1* 34.0* 39.8 58.6*  MCV 101.3* 100.3* 98.0 96.8 98.5  PLT 102* 97* 94* 80* 67*   Blood Culture    Component Value Date/Time   SDES BLOOD LEFT HAND 05/09/2021 1451   SPECREQUEST  05/09/2021 1451    BOTTLES DRAWN AEROBIC ONLY Blood Culture results may not be optimal due to an inadequate volume of blood received in culture bottles   CULT   05/09/2021 1451    NO GROWTH < 24 HOURS Performed at [facility], [place] [address] [place], [place] [address]    REPTSTATUS PENDING 05/09/2021 1451   Studies/Results: MR BRAIN WO CONTRAST  Addendum Date: 05/09/2021   ADDENDUM REPORT: 05/09/2021 12:04 ADDENDUM: Impression #2 called by telephone at the time of interpretation on 05/09/2021 at 12:03 pm to provider [facility] , who verbally acknowledged these results. Electronically Signed   By: Kellie Simmering D.O.   On: 05/09/2021 12:04   Result Date: 05/09/2021 CLINICAL DATA:  Mental status change, persistent or worsening; sudden onset delirium. EXAM: MRI HEAD WITHOUT CONTRAST TECHNIQUE: Multiplanar, multiecho pulse sequences of the brain and surrounding structures were obtained without intravenous contrast. COMPARISON:  Prior head CT examinations 05/08/2021 and earlier. MRI brain and MRA head 11/10/2017. FINDINGS: Brain: Intermittently motion degraded examination, limiting evaluation. Most notably, there is mild-to-moderate motion degradation of the axial T2 TSE sequence, moderate/severe motion degradation of the axial T2 FLAIR sequence and moderate motion degradation of the coronal T2 TSE  sequence. Mild-to-moderate generalized cerebral atrophy. Comparatively mild cerebellar atrophy. Redemonstrated chronic small-vessel infarct within the right basal ganglia/external capsule. Background mild multifocal T2 FLAIR hyperintense signal abnormality within the cerebral white matter, nonspecific but compatible with chronic small vessel ischemic disease. Chronic microhemorrhages within the right frontal, parietal and temporal lobes, new from the prior MRI (3 total). Asymmetric T2* signal loss along the left tentorium, suspicious for a trace subdural hematoma. In retrospect, there was trace asymmetric hyperdensity along the left tentorium on the head CT of 05/08/2021. There is no acute infarct. No evidence of an intracranial mass. No extra-axial fluid  collection. No midline shift. Vascular: Signal abnormality within portions of the V4 right vertebral artery suggesting high-grade stenosis or vessel occlusion. Flow voids otherwise maintained within the proximal large arterial vessels. Skull and upper cervical spine: No focal suspicious marrow lesion. Diffuse osseous heterogeneity, likely related to end-stage renal disease. Sinuses/Orbits: Visualized orbits show no acute finding. Minimal scattered paranasal sinus mucosal thickening at the imaged levels. Other: Trace fluid within the left mastoid air cells. IMPRESSION: Intermittently motion degraded examination, as described. Suspected trace acute subdural hematoma layering along the left tentorium. No evidence of acute infarction. Redemonstrated chronic small-vessel infarct within the right basal ganglia/external capsule. Background mild chronic small vessel ischemic changes within the cerebral white matter, progressed from the brain MRI of 11/10/2017. Mild-to-moderate generalized cerebral atrophy. Comparatively mild cerebellar atrophy. Signal abnormality within the intracranial right vertebral artery, suggesting high-grade stenosis or vessel occlusion. This is a new finding as compared to the MRI/MRA of 11/10/2017. Trace left mastoid effusion. Electronically Signed: By: Kellie Simmering D.O. On: 05/09/2021 11:56   ECHOCARDIOGRAM COMPLETE  Result Date: 05/09/2021    ECHOCARDIOGRAM REPORT   Patient Name:   [facility] Date of Exam: 05/09/2021 Medical Rec #:  [id number]      Height:       68.0 in Accession #:    [id number]     Weight:       132.3 lb Date of Birth:  [birth year]     BSA:          1.714 m Patient Age:    65 years       BP:           135/71 mmHg Patient Gender: M              HR:           62 bpm. Exam Location:  Inpatient Procedure: 2D Echo, Cardiac Doppler and Color Doppler Indications:    Bacteremia  History:        Patient has no prior history of Echocardiogram examinations and                  Patient has prior history of Echocardiogram examinations, most                 recent 07/18/2019. CHF; Risk Factors:Hypertension and Current                 Smoker.  Sonographer:    Merrie Roof RDCS Referring Phys: [id number] [place]  1. Left ventricular ejection fraction, by estimation, is 40 to 45%. The left ventricle has mildly decreased function. The left ventricle demonstrates global hypokinesis. There is moderate left ventricular hypertrophy. Left ventricular diastolic parameters are consistent with Grade II diastolic dysfunction (pseudonormalization). Elevated left atrial pressure.  2. Right ventricular systolic function is mildly reduced. The right ventricular size is mildly enlarged. There is moderately elevated  pulmonary artery systolic pressure. The estimated right ventricular systolic pressure is 53.6 mmHg.  3. Left atrial size was severely dilated.  4. Right atrial size was severely dilated.  5. The mitral valve is normal in structure. Mild mitral valve regurgitation. No evidence of mitral stenosis.  6. The aortic valve was not well visualized. Aortic valve regurgitation is not visualized. Aortic valve sclerosis/calcification is present, without any evidence of aortic stenosis.  7. The inferior vena cava is dilated in size with <50% respiratory variability, suggesting right atrial pressure of 15 mmHg. Conclusion(s)/Recommendation(s): No evidence of valvular vegetations on this transthoracic echocardiogram. Consider a transesophageal echocardiogram to exclude infective endocarditis if clinically indicated. FINDINGS  Left Ventricle: Left ventricular ejection fraction, by estimation, is 40 to 45%. The left ventricle has mildly decreased function. The left ventricle demonstrates global hypokinesis. The left ventricular internal cavity size was normal in size. There is  moderate left ventricular hypertrophy. Left ventricular diastolic parameters are consistent with Grade II diastolic dysfunction  (pseudonormalization). Elevated left atrial pressure. Right Ventricle: The right ventricular size is mildly enlarged. Right vetricular wall thickness was not well visualized. Right ventricular systolic function is mildly reduced. There is moderately elevated pulmonary artery systolic pressure. The tricuspid  regurgitant velocity is 2.97 m/s, and with an assumed right atrial pressure of 15 mmHg, the estimated right ventricular systolic pressure is 14.4 mmHg. Left Atrium: Left atrial size was severely dilated. Right Atrium: Right atrial size was severely dilated. Pericardium: There is no evidence of pericardial effusion. Mitral Valve: The mitral valve is normal in structure. Mild mitral valve regurgitation. No evidence of mitral valve stenosis. Tricuspid Valve: The tricuspid valve is normal in structure. Tricuspid valve regurgitation is trivial. Aortic Valve: The aortic valve was not well visualized. Aortic valve regurgitation is not visualized. Aortic valve sclerosis/calcification is present, without any evidence of aortic stenosis. Aortic valve mean gradient measures 6.0 mmHg. Aortic valve peak gradient measures 13.4 mmHg. Aortic valve area, by VTI measures 1.92 cm. Pulmonic Valve: The pulmonic valve was not well visualized. Pulmonic valve regurgitation is not visualized. Aorta: The aortic root and ascending aorta are structurally normal, with no evidence of dilitation. Venous: The inferior vena cava is dilated in size with less than 50% respiratory variability, suggesting right atrial pressure of 15 mmHg. IAS/Shunts: The interatrial septum was not well visualized.  LEFT VENTRICLE PLAX 2D LVIDd:         5.00 cm   Diastology LVIDs:         4.00 cm   LV e' medial:    3.08 cm/s LV PW:         1.40 cm   LV E/e' medial:  27.8 LV IVS:        1.40 cm   LV e' lateral:   6.23 cm/s LVOT diam:     2.10 cm   LV E/e' lateral: 13.8 LV SV:         66 LV SV Index:   38 LVOT Area:     3.46 cm  RIGHT VENTRICLE            IVC RV  Basal diam:  4.60 cm    IVC diam: 2.10 cm RV Mid diam:    3.00 cm RV S prime:     8.33 cm/s TAPSE (M-mode): 1.6 cm LEFT ATRIUM              Index        RIGHT ATRIUM  Index LA diam:        3.90 cm  2.27 cm/m   RA Area:     26.60 cm LA Vol (A2C):   129.0 ml 75.24 ml/m  RA Volume:   96.00 ml  56.00 ml/m LA Vol (A4C):   74.6 ml  43.51 ml/m LA Biplane Vol: 98.0 ml  57.16 ml/m  AORTIC VALVE AV Area (Vmax):    2.33 cm AV Area (Vmean):   2.22 cm AV Area (VTI):     1.92 cm AV Vmax:           183.00 cm/s AV Vmean:          111.000 cm/s AV VTI:            0.342 m AV Peak Grad:      13.4 mmHg AV Mean Grad:      6.0 mmHg LVOT Vmax:         123.00 cm/s LVOT Vmean:        71.300 cm/s LVOT VTI:          0.190 m LVOT/AV VTI ratio: 0.56  AORTA Ao Root diam: 3.10 cm Ao Asc diam:  3.00 cm MITRAL VALVE               TRICUSPID VALVE MV Area (PHT): 2.69 cm    TR Peak grad:   35.3 mmHg MV Decel Time: 282 msec    TR Vmax:        297.00 cm/s MV E velocity: 85.70 cm/s MV A velocity: 51.60 cm/s  SHUNTS MV E/A ratio:  1.66        Systemic VTI:  0.19 m                            Systemic Diam: 2.10 cm [name] MD Electronically signed by [name] MD Signature Date/Time: 05/09/2021/5:51:14 PM    Final    Medications:  vancomycin      calcium acetate  1,334 mg Oral TID WC   calcium carbonate  1 tablet Oral TID WC   Chlorhexidine Gluconate Cloth  6 each Topical Q0600   famotidine  20 mg Oral Daily   heparin  5,000 Units Subcutaneous Q8H   lidocaine  1 patch Transdermal Q24H   LORazepam  1 mg Intravenous Once   metoprolol tartrate  25 mg Oral BID   sodium chloride flush  3 mL Intravenous Q12H    Dialysis Orders: Center: [facility]  on TTS EDW 70kg HD Bath 2K/2Ca  Time 4 hours Heparin none. Access R forearm AVG BFR 350 DFR 800    Micera 100 mcg every 2 weeks   Assessment/Plan:  Hyperkalemia: On admit, treated medically followed by dialysis, now normal. Hypoglycemia - Improved.   Encephalopathy - developed during admission. CT head with no acute findings.  Ammonia, salicylate and tylenol levels normal. Blood Cx positive. Brain MRI with small subdural and possible vertebral artery stenosis MRSA bacteremia: Bcx 12/2 +, repeat 12/3 pending. TTE negative. On Vancomycin.  ESRD: Back on TTS schedule now - next today.  Hypertension/volume: BP variable, no edema. CXR with no acute abnormalities. Continue UF as tolerated. Under dry weight, will need to be lowered on d/c.   Anemia  - Hgb 18.3, ?assuming lab error - repeat today.  Metabolic bone disease -  Phos elevated, continue calcium acetate with meals. May need to change to non calcium based binder. Last pth 86, hold vit D  due to elevated Calcium/suppressed PTH.   Nutrition -  renal diet w/fluid restrictions Right flank pain - s/p fall.  No acute findings on imaging.  Elevated AST and ALT - unclear etiology, ?shock liver.  Abd CT and u/s without acute abnormalities. Improving.  Veneta Penton, PA-C 05/10/2021, 1:12 PM  [employer]

## 2021-05-11 DIAGNOSIS — R7989 Other specified abnormal findings of blood chemistry: Secondary | ICD-10-CM | POA: Diagnosis not present

## 2021-05-11 DIAGNOSIS — R7881 Bacteremia: Secondary | ICD-10-CM | POA: Diagnosis not present

## 2021-05-11 DIAGNOSIS — E876 Hypokalemia: Secondary | ICD-10-CM

## 2021-05-11 DIAGNOSIS — G9341 Metabolic encephalopathy: Secondary | ICD-10-CM | POA: Diagnosis not present

## 2021-05-11 LAB — CULTURE, BLOOD (ROUTINE X 2)

## 2021-05-11 LAB — COMPREHENSIVE METABOLIC PANEL
ALT: 492 U/L — ABNORMAL HIGH (ref 0–44)
AST: 40 U/L (ref 15–41)
Albumin: 2.9 g/dL — ABNORMAL LOW (ref 3.5–5.0)
Alkaline Phosphatase: 99 U/L (ref 38–126)
Anion gap: 10 (ref 5–15)
BUN: 18 mg/dL (ref 8–23)
CO2: 27 mmol/L (ref 22–32)
Calcium: 7.7 mg/dL — ABNORMAL LOW (ref 8.9–10.3)
Chloride: 100 mmol/L (ref 98–111)
Creatinine, Ser: 4.14 mg/dL — ABNORMAL HIGH (ref 0.61–1.24)
GFR, Estimated: 15 mL/min — ABNORMAL LOW (ref >60)
Glucose, Bld: 160 mg/dL — ABNORMAL HIGH (ref 70–99)
Potassium: 2.8 mmol/L — ABNORMAL LOW (ref 3.5–5.1)
Sodium: 137 mmol/L (ref 135–145)
Total Bilirubin: 2 mg/dL — ABNORMAL HIGH (ref 0.3–1.2)
Total Protein: 5.7 g/dL — ABNORMAL LOW (ref 6.5–8.1)

## 2021-05-11 MED ORDER — POTASSIUM CHLORIDE CRYS ER 20 MEQ PO TBCR
40.0000 meq | EXTENDED_RELEASE_TABLET | Freq: Once | ORAL | Status: AC
  Administered 2021-05-11: 13:00:00 40 meq via ORAL
  Filled 2021-05-11: qty 2

## 2021-05-11 NOTE — Progress Notes (Addendum)
[facility] Progress Note   Subjective:  Seen in room - no new complaints. No CP/dyspnea. Tells me going for TEE tomorrow morning.  Objective Vitals:   05/10/21 2017 05/10/21 2300 05/11/21 0437 05/11/21 0751  BP: (!) 149/78 137/84 (!) 149/118 130/74  Pulse: 69 72 70 69  Resp: 16 (!) 22 19 16   Temp: 98.1 F (36.7 C) 98.5 F (36.9 C) 98.3 F (36.8 C) 98.4 F (36.9 C)  TempSrc: Oral Oral Oral Oral  SpO2: 94% 93% 95% 96%  Weight:       Physical Exam General: Chronically ill appearing man, NAD, scattered bruised areas Heart: RRR; no murmur Lungs: CTAB Abdomen: soft Extremities: No LE edema Dialysis Access: R forearm AVF + bruit   Additional Objective Labs: Basic Metabolic Panel: Recent Labs  Lab 05/06/21 0300 05/07/21 0217 05/09/21 0303 05/10/21 0606 05/11/21 0049  NA 139   < > 136 140 137  K 6.0*   < > 4.1 3.9 2.8*  CL 95*   < > 95* 102 100  CO2 22   < > 26 22 27   GLUCOSE 84   < > 74 114* 160*  BUN 107*   < > 24* 48* 18  CREATININE 16.65*   < > 5.10* 7.87* 4.14*  CALCIUM 9.4   < > 8.1* 8.0* 7.7*  PHOS 11.3*  --   --   --   --    < > = values in this interval not displayed.   Liver Function Tests: Recent Labs  Lab 05/09/21 0303 05/10/21 0606 05/11/21 0049  AST 218* 62* 40  ALT 1,066* 664* 492*  ALKPHOS 108 103 99  BILITOT 3.5* 2.1* 2.0*  PROT 6.0* 5.6* 5.7*  ALBUMIN 3.1* 2.9* 2.9*   CBC: Recent Labs  Lab 05/05/21 1534 05/06/21 0100 05/06/21 0300 05/08/21 1435 05/08/21 2048 05/10/21 1330  WBC 13.0* 11.7* 11.8* 7.8 7.2 7.4  NEUTROABS 10.5*  --   --   --   --   --   HGB 12.0* 10.7* 11.0* 12.7* 18.3* 11.1*  HCT 38.5* 34.1* 34.0* 39.8 58.6* 34.1*  MCV 101.3* 100.3* 98.0 96.8 98.5 97.2  PLT 102* 97* 94* 80* 67* 67*   Blood Culture    Component Value Date/Time   SDES BLOOD LEFT HAND 05/10/2021 0612   SPECREQUEST  05/10/2021 0612    AEROBIC BOTTLE ONLY Blood Culture results may not be optimal due to an inadequate volume of blood  received in culture bottles   CULT  05/10/2021 0612    NO GROWTH 1 DAY Performed at [facility], [place], [place], [place] [address]    REPTSTATUS PENDING 05/10/2021 0932   Studies/Results: MR BRAIN WO CONTRAST  Addendum Date: 05/09/2021   ADDENDUM REPORT: 05/09/2021 12:04 ADDENDUM: Impression #2 called by telephone at the time of interpretation on 05/09/2021 at 12:03 pm to provider [facility] , who verbally acknowledged these results. Electronically Signed   By: Kellie Simmering D.O.   On: 05/09/2021 12:04   Result Date: 05/09/2021 CLINICAL DATA:  Mental status change, persistent or worsening; sudden onset delirium. EXAM: MRI HEAD WITHOUT CONTRAST TECHNIQUE: Multiplanar, multiecho pulse sequences of the brain and surrounding structures were obtained without intravenous contrast. COMPARISON:  Prior head CT examinations 05/08/2021 and earlier. MRI brain and MRA head 11/10/2017. FINDINGS: Brain: Intermittently motion degraded examination, limiting evaluation. Most notably, there is mild-to-moderate motion degradation of the axial T2 TSE sequence, moderate/severe motion degradation of the axial T2 FLAIR sequence and  moderate motion degradation of the coronal T2 TSE sequence. Mild-to-moderate generalized cerebral atrophy. Comparatively mild cerebellar atrophy. Redemonstrated chronic small-vessel infarct within the right basal ganglia/external capsule. Background mild multifocal T2 FLAIR hyperintense signal abnormality within the cerebral white matter, nonspecific but compatible with chronic small vessel ischemic disease. Chronic microhemorrhages within the right frontal, parietal and temporal lobes, new from the prior MRI (3 total). Asymmetric T2* signal loss along the left tentorium, suspicious for a trace subdural hematoma. In retrospect, there was trace asymmetric hyperdensity along the left tentorium on the head CT of 05/08/2021. There is no acute infarct. No evidence of an intracranial mass.  No extra-axial fluid collection. No midline shift. Vascular: Signal abnormality within portions of the V4 right vertebral artery suggesting high-grade stenosis or vessel occlusion. Flow voids otherwise maintained within the proximal large arterial vessels. Skull and upper cervical spine: No focal suspicious marrow lesion. Diffuse osseous heterogeneity, likely related to end-stage renal disease. Sinuses/Orbits: Visualized orbits show no acute finding. Minimal scattered paranasal sinus mucosal thickening at the imaged levels. Other: Trace fluid within the left mastoid air cells. IMPRESSION: Intermittently motion degraded examination, as described. Suspected trace acute subdural hematoma layering along the left tentorium. No evidence of acute infarction. Redemonstrated chronic small-vessel infarct within the right basal ganglia/external capsule. Background mild chronic small vessel ischemic changes within the cerebral white matter, progressed from the brain MRI of 11/10/2017. Mild-to-moderate generalized cerebral atrophy. Comparatively mild cerebellar atrophy. Signal abnormality within the intracranial right vertebral artery, suggesting high-grade stenosis or vessel occlusion. This is a new finding as compared to the MRI/MRA of 11/10/2017. Trace left mastoid effusion. Electronically Signed: By: Kellie Simmering D.O. On: 05/09/2021 11:56   ECHOCARDIOGRAM COMPLETE  Result Date: 05/09/2021    ECHOCARDIOGRAM REPORT   Patient Name:   Albert Harris Date of Exam: 05/09/2021 Medical Rec #:  [id number]      Height:       68.0 in Accession #:    [id number]     Weight:       132.3 lb Date of Birth:  [birth year]     BSA:          1.714 m Patient Age:    65 years       BP:           135/71 mmHg Patient Gender: M              HR:           62 bpm. Exam Location:  Inpatient Procedure: 2D Echo, Cardiac Doppler and Color Doppler Indications:    Bacteremia  History:        Patient has no prior history of Echocardiogram examinations and                  Patient has prior history of Echocardiogram examinations, most                 recent 07/18/2019. CHF; Risk Factors:Hypertension and Current                 Smoker.  Sonographer:    Merrie Roof RDCS Referring Phys: [id number] [place]  1. Left ventricular ejection fraction, by estimation, is 40 to 45%. The left ventricle has mildly decreased function. The left ventricle demonstrates global hypokinesis. There is moderate left ventricular hypertrophy. Left ventricular diastolic parameters are consistent with Grade II diastolic dysfunction (pseudonormalization). Elevated left atrial pressure.  2. Right ventricular systolic function is mildly reduced. The right ventricular  size is mildly enlarged. There is moderately elevated pulmonary artery systolic pressure. The estimated right ventricular systolic pressure is 32.9 mmHg.  3. Left atrial size was severely dilated.  4. Right atrial size was severely dilated.  5. The mitral valve is normal in structure. Mild mitral valve regurgitation. No evidence of mitral stenosis.  6. The aortic valve was not well visualized. Aortic valve regurgitation is not visualized. Aortic valve sclerosis/calcification is present, without any evidence of aortic stenosis.  7. The inferior vena cava is dilated in size with <50% respiratory variability, suggesting right atrial pressure of 15 mmHg. Conclusion(s)/Recommendation(s): No evidence of valvular vegetations on this transthoracic echocardiogram. Consider a transesophageal echocardiogram to exclude infective endocarditis if clinically indicated. FINDINGS  Left Ventricle: Left ventricular ejection fraction, by estimation, is 40 to 45%. The left ventricle has mildly decreased function. The left ventricle demonstrates global hypokinesis. The left ventricular internal cavity size was normal in size. There is  moderate left ventricular hypertrophy. Left ventricular diastolic parameters are consistent with Grade II  diastolic dysfunction (pseudonormalization). Elevated left atrial pressure. Right Ventricle: The right ventricular size is mildly enlarged. Right vetricular wall thickness was not well visualized. Right ventricular systolic function is mildly reduced. There is moderately elevated pulmonary artery systolic pressure. The tricuspid  regurgitant velocity is 2.97 m/s, and with an assumed right atrial pressure of 15 mmHg, the estimated right ventricular systolic pressure is 51.8 mmHg. Left Atrium: Left atrial size was severely dilated. Right Atrium: Right atrial size was severely dilated. Pericardium: There is no evidence of pericardial effusion. Mitral Valve: The mitral valve is normal in structure. Mild mitral valve regurgitation. No evidence of mitral valve stenosis. Tricuspid Valve: The tricuspid valve is normal in structure. Tricuspid valve regurgitation is trivial. Aortic Valve: The aortic valve was not well visualized. Aortic valve regurgitation is not visualized. Aortic valve sclerosis/calcification is present, without any evidence of aortic stenosis. Aortic valve mean gradient measures 6.0 mmHg. Aortic valve peak gradient measures 13.4 mmHg. Aortic valve area, by VTI measures 1.92 cm. Pulmonic Valve: The pulmonic valve was not well visualized. Pulmonic valve regurgitation is not visualized. Aorta: The aortic root and ascending aorta are structurally normal, with no evidence of dilitation. Venous: The inferior vena cava is dilated in size with less than 50% respiratory variability, suggesting right atrial pressure of 15 mmHg. IAS/Shunts: The interatrial septum was not well visualized.  LEFT VENTRICLE PLAX 2D LVIDd:         5.00 cm   Diastology LVIDs:         4.00 cm   LV e' medial:    3.08 cm/s LV PW:         1.40 cm   LV E/e' medial:  27.8 LV IVS:        1.40 cm   LV e' lateral:   6.23 cm/s LVOT diam:     2.10 cm   LV E/e' lateral: 13.8 LV SV:         66 LV SV Index:   38 LVOT Area:     3.46 cm  RIGHT VENTRICLE             IVC RV Basal diam:  4.60 cm    IVC diam: 2.10 cm RV Mid diam:    3.00 cm RV S prime:     8.33 cm/s TAPSE (M-mode): 1.6 cm LEFT ATRIUM              Index        RIGHT  ATRIUM           Index LA diam:        3.90 cm  2.27 cm/m   RA Area:     26.60 cm LA Vol (A2C):   129.0 ml 75.24 ml/m  RA Volume:   96.00 ml  56.00 ml/m LA Vol (A4C):   74.6 ml  43.51 ml/m LA Biplane Vol: 98.0 ml  57.16 ml/m  AORTIC VALVE AV Area (Vmax):    2.33 cm AV Area (Vmean):   2.22 cm AV Area (VTI):     1.92 cm AV Vmax:           183.00 cm/s AV Vmean:          111.000 cm/s AV VTI:            0.342 m AV Peak Grad:      13.4 mmHg AV Mean Grad:      6.0 mmHg LVOT Vmax:         123.00 cm/s LVOT Vmean:        71.300 cm/s LVOT VTI:          0.190 m LVOT/AV VTI ratio: 0.56  AORTA Ao Root diam: 3.10 cm Ao Asc diam:  3.00 cm MITRAL VALVE               TRICUSPID VALVE MV Area (PHT): 2.69 cm    TR Peak grad:   35.3 mmHg MV Decel Time: 282 msec    TR Vmax:        297.00 cm/s MV E velocity: 85.70 cm/s MV A velocity: 51.60 cm/s  SHUNTS MV E/A ratio:  1.66        Systemic VTI:  0.19 m                            Systemic Diam: 2.10 cm [name] MD Electronically signed by [name] MD Signature Date/Time: 05/09/2021/5:51:14 PM    Final    Medications:  vancomycin      calcium acetate  1,334 mg Oral TID WC   calcium carbonate  1 tablet Oral TID WC   Chlorhexidine Gluconate Cloth  6 each Topical Q0600   famotidine  20 mg Oral Daily   heparin  5,000 Units Subcutaneous Q8H   lidocaine  1 patch Transdermal Q24H   metoprolol tartrate  25 mg Oral BID   potassium chloride  40 mEq Oral Once   sodium chloride flush  3 mL Intravenous Q12H    Dialysis Orders: Center: [facility]  on TTS EDW 70kg HD Bath 2K/2Ca  Time 4 hours Heparin none. Access R forearm AVG BFR 350 DFR 800    Micera 100 mcg every 2 weeks   Assessment/Plan:  Hyperkalemia: On admit, treated medically followed by dialysis, now hyPOkalemic - ordered for  18mEq today which is fine. Hypoglycemia - Improved.  Encephalopathy - developed during admission. CT head with no acute findings.  Ammonia, salicylate and tylenol levels normal. Blood Cx positive. Brain MRI with small subdural and possible vertebral artery stenosis MRSA bacteremia: Bcx 12/2 +, repeat 12/3 pending. TTE negative, for TEE tomorrow. On Vancomycin.  ESRD: Back on TTS schedule now - next 12/6.  Hypertension/volume: BP variable, no edema. CXR with no acute abnormalities. Continue UF as tolerated. Under dry weight, will need to be lowered on d/c.   Anemia  - Hgb 11.1 - platelets low side as well. No ESA needed.  Metabolic  bone disease -  Phos elevated, continue calcium acetate with meals. May need to change to non calcium based binder. Last pth 57, hold vit D due to elevated Calcium/suppressed PTH. Also on tums -> d/c these.  Nutrition -  renal diet w/fluid restrictions Right flank pain - s/p fall.  No acute findings on imaging.  Elevated AST and ALT - unclear etiology, ?shock liver.  Abd CT and u/s without acute abnormalities. Improving.    Veneta Penton, PA-C 05/11/2021, 10:06 AM  [employer]

## 2021-05-11 NOTE — Progress Notes (Signed)
PROGRESS NOTE    Albert Harris  OHY:[id number] DOB: 11-12-1955 DOA: 05/05/2021 PCP: Kerin Perna, NP    Brief Narrative:   65 year old gentleman with history of ESRD on hemodialysis, COPD, peripheral vascular disease, hypertension, hyperlipidemia and severe noncompliance to dialysis regimen presented with fall at home.  Not going to hemodialysis since last 1 week.  Not eating well since Thanksgiving as per family.  In the emergency room hemodynamically stable.  Potassium 7. AST and ALT 2428/2294 with bilirubin of 2.  Urgently hemodialyzed.  Skeletal survey negative.  Patient has clinically improved and prepared to discharge after hemodialysis on 11/30.  He became very confused and combative so discharge was canceled.  Patient remained encephalopathic overnight with no obvious known cause. 12/2, most of the delirium improved now.   -Blood culture growing MRSA.     Assessment & Plan:   Principal Problem:   Hyperkalemia Active Problems:   ESRD (end stage renal disease) (HCC)   Hypertension   COPD (chronic obstructive pulmonary disease) (HCC)   Elevated LFTs   Hypoglycemia   Thrombocytopenia (HCC)   Fall at home, initial encounter   Bacteremia due to Gram-positive bacteria   Acute metabolic encephalopathy  Acute metabolic encephalopathy: Developed in the hospital.  Primary cause unknown. -No drugs or alcohol -CT head was normal.  Subsequent MRI with a small subdural possibly happened after a fall in the bathroom but no other complications. -Ammonia, salicylate, Tylenol levels normal.  Blood gases essentially normal. -LFTs are already improving and downtrending. -Adequately stabilizing.  Continue fall precautions delirium precautions.  Avoid any benzodiazepines or opiates. -Patient has improved, back to baseline  MRSA bacteremia -Follow on repeat blood cultures, meanwhile keep empirically on IV vancomycin, patient will need TEE, discussed with cardiology, they will plan  on doing it likely tomorrow.    ESRD on hemodialysis, missed hemodialysis resulting in hyperkalemia: Treated with temporizing measures with calcium gluconate, insulin and dextrose infusion as well bicarbonate. Patient underwent consecutive dialysis sessions with improvement. Next dialysis tomorrow.  Hyperkalemia/hypokalemia -Initially with hyperkalemia, resolved with dialysis, potassium significantly low at 2.8 today, repleted   Elevated LFTs:  -Likely due to shock liver from hypotension, CT abdomen pelvis with no acute findings, no acute finding on right upper quadrant ultrasound, -trending down   Hypoglycemia: Due to poor oral intake and insulin use per correction of hyperkalemia.  Regular diet.  Blood sugars stabilized after eating regular diet.  COPD: Stable.  Albuterol as needed.   Fall: Patient has right lateral rib pain and tenderness that is reproducible.  Skeletal survey negative.  Local lidocaine patch. -Patient with very small some subdural tentorial bleed, discussed with neurosurgery Dr. Kathyrn Sheriff, patient is asymptomatic, no contraindication for subcu heparin for DVT prophylaxis.   Continue attempt to mobilize.  Monitor for positive blood cultures.  Anticipate discharge home tomorrow if contaminant cultures.  DVT prophylaxis: heparin injection 5,000 Units Start: 05/06/21 0000   Code Status: Full code Family Communication: None at bedside  Disposition Plan: Status is: Inpatient  Remains inpatient appropriate because: Altered mental status, positive blood cultures.         Consultants:  Nephrology  Procedures:  Hemodialysis  Antimicrobials:  Vancomycin 12/1---   Subjective:  She denies any complaints, no significant events overnight.  Objective: Vitals:   05/10/21 2300 05/11/21 0437 05/11/21 0751 05/11/21 1145  BP: 137/84 (!) 149/118 130/74 (!) 123/92  Pulse: 72 70 69 70  Resp: (!) 22 19 16 20   Temp: 98.5 F (36.9 C) 98.3  F (36.8 C) 98.4 F  (36.9 C) 98.4 F (36.9 C)  TempSrc: Oral Oral Oral Oral  SpO2: 93% 95% 96% 96%  Weight:        Intake/Output Summary (Last 24 hours) at 05/11/2021 1214 Last data filed at 05/11/2021 0500 Gross per 24 hour  Intake 250 ml  Output 1500 ml  Net -1250 ml    Filed Weights   05/09/21 0500 05/10/21 1339 05/10/21 1745  Weight: 60 kg 60.7 kg 59 kg    Examination:  Awake Alert, Oriented X 3, No new F.N deficits, Normal affect Symmetrical Chest wall movement, Good air movement bilaterally, CTAB RRR,No Gallops,Rubs or new Murmurs, No Parasternal Heave +ve B.Sounds, Abd Soft, No tenderness, No rebound - guarding or rigidity. No Cyanosis, Clubbing or edema, No new Rash or bruise        Data Reviewed: I have personally reviewed following labs and imaging studies  CBC: Recent Labs  Lab 05/05/21 1534 05/06/21 0100 05/06/21 0300 05/08/21 1435 05/08/21 2048 05/10/21 1330  WBC 13.0* 11.7* 11.8* 7.8 7.2 7.4  NEUTROABS 10.5*  --   --   --   --   --   HGB 12.0* 10.7* 11.0* 12.7* 18.3* 11.1*  HCT 38.5* 34.1* 34.0* 39.8 58.6* 34.1*  MCV 101.3* 100.3* 98.0 96.8 98.5 97.2  PLT 102* 97* 94* 80* 67* 67*   Basic Metabolic Panel: Recent Labs  Lab 05/06/21 0300 05/07/21 0217 05/08/21 0241 05/09/21 0303 05/10/21 0606 05/11/21 0049  NA 139 135 135 136 140 137  K 6.0* 5.7* 5.3* 4.1 3.9 2.8*  CL 95* 95* 96* 95* 102 100  CO2 22 22 19* 26 22 27   GLUCOSE 84 90 76 74 114* 160*  BUN 107* 54* 30* 24* 48* 18  CREATININE 16.65* 11.01* 6.59* 5.10* 7.87* 4.14*  CALCIUM 9.4 9.5 8.6* 8.1* 8.0* 7.7*  PHOS 11.3*  --   --   --   --   --    GFR: Estimated Creatinine Clearance: 14.6 mL/min (A) (by C-G formula based on SCr of 4.14 mg/dL (H)). Liver Function Tests: Recent Labs  Lab 05/07/21 0954 05/08/21 0241 05/09/21 0303 05/10/21 0606 05/11/21 0049  AST 544* 462* 218* 62* 40  ALT 1,563* 1,356* 1,066* 664* 492*  ALKPHOS 126 119 108 103 99  BILITOT 2.1* 2.8* 3.5* 2.1* 2.0*  PROT 6.2* 5.8*  6.0* 5.6* 5.7*  ALBUMIN 3.2* 3.1* 3.1* 2.9* 2.9*   No results for input(s): LIPASE, AMYLASE in the last 168 hours. Recent Labs  Lab 05/08/21 1020  AMMONIA 36*   Coagulation Profile: Recent Labs  Lab 05/06/21 0100 05/09/21 0303  INR 2.8* 1.3*   Cardiac Enzymes: No results for input(s): CKTOTAL, CKMB, CKMBINDEX, TROPONINI in the last 168 hours. BNP (last 3 results) No results for input(s): PROBNP in the last 8760 hours. HbA1C: No results for input(s): HGBA1C in the last 72 hours. CBG: Recent Labs  Lab 05/06/21 0739 05/06/21 0804 05/06/21 0833 05/06/21 1822 05/07/21 1842  GLUCAP 58* 98 93 111* 85   Lipid Profile: No results for input(s): CHOL, HDL, LDLCALC, TRIG, CHOLHDL, LDLDIRECT in the last 72 hours. Thyroid Function Tests: No results for input(s): TSH, T4TOTAL, FREET4, T3FREE, THYROIDAB in the last 72 hours. Anemia Panel: No results for input(s): VITAMINB12, FOLATE, FERRITIN, TIBC, IRON, RETICCTPCT in the last 72 hours. Sepsis Labs: No results for input(s): PROCALCITON, LATICACIDVEN in the last 168 hours.  Recent Results (from the past 240 hour(s))  Resp Panel by RT-PCR (Flu A&B, Covid) Nasopharyngeal  Swab     Status: None   Collection Time: 05/05/21 10:42 PM   Specimen: Nasopharyngeal Swab; Nasopharyngeal(NP) swabs in vial transport medium  Result Value Ref Range Status   SARS Coronavirus 2 by RT PCR NEGATIVE NEGATIVE Final    Comment: (NOTE) SARS-CoV-2 target nucleic acids are NOT DETECTED.  The SARS-CoV-2 RNA is generally detectable in upper respiratory specimens during the acute phase of infection. The lowest concentration of SARS-CoV-2 viral copies this assay can detect is 138 copies/mL. A negative result does not preclude SARS-Cov-2 infection and should not be used as the sole basis for treatment or other patient management decisions. A negative result may occur with  improper specimen collection/handling, submission of specimen other than  nasopharyngeal swab, presence of viral mutation(s) within the areas targeted by this assay, and inadequate number of viral copies(<138 copies/mL). A negative result must be combined with clinical observations, patient history, and epidemiological information. The expected result is Negative.  Fact Sheet for Patients:  EntrepreneurPulse.com.au  Fact Sheet for Healthcare Providers:  IncredibleEmployment.be  This test is no t yet approved or cleared by the [place] FDA and  has been authorized for detection and/or diagnosis of SARS-CoV-2 by FDA under an Emergency Use Authorization (EUA). This EUA will remain  in effect (meaning this test can be used) for the duration of the COVID-19 declaration under Section 564(b)(1) of the Act, 21 U.S.C.section 360bbb-3(b)(1), unless the authorization is terminated  or revoked sooner.       Influenza A by PCR NEGATIVE NEGATIVE Final   Influenza B by PCR NEGATIVE NEGATIVE Final    Comment: (NOTE) The Xpert Xpress SARS-CoV-2/FLU/RSV plus assay is intended as an aid in the diagnosis of influenza from Nasopharyngeal swab specimens and should not be used as a sole basis for treatment. Nasal washings and aspirates are unacceptable for Xpert Xpress SARS-CoV-2/FLU/RSV testing.  Fact Sheet for Patients: EntrepreneurPulse.com.au  Fact Sheet for Healthcare Providers: IncredibleEmployment.be  This test is not yet approved or cleared by the [place] FDA and has been authorized for detection and/or diagnosis of SARS-CoV-2 by FDA under an Emergency Use Authorization (EUA). This EUA will remain in effect (meaning this test can be used) for the duration of the COVID-19 declaration under Section 564(b)(1) of the Act, 21 U.S.C. section 360bbb-3(b)(1), unless the authorization is terminated or revoked.  Performed at [facility], [place] [address], [place], [place] [address]    Culture, blood (routine x 2)     Status: Abnormal   Collection Time: 05/08/21 10:33 AM   Specimen: BLOOD  Result Value Ref Range Status   Specimen Description BLOOD LEFT ANTECUBITAL  Final   Special Requests   Final    BOTTLES DRAWN AEROBIC AND ANAEROBIC Blood Culture results may not be optimal due to an inadequate volume of blood received in culture bottles   Culture  Setup Time   Final    GRAM POSITIVE COCCI IN BOTH AEROBIC AND ANAEROBIC BOTTLES CRITICAL RESULT CALLED TO, READ BACK BY AND VERIFIED WITH: PHARMD JAMES LEDFORD 05/09/21@4 :55 BY TW    Culture (A)  Final    METHICILLIN RESISTANT STAPHYLOCOCCUS AUREUS STAPHYLOCOCCUS EPIDERMIDIS THE SIGNIFICANCE OF ISOLATING THIS ORGANISM FROM A SINGLE SET OF BLOOD CULTURES WHEN MULTIPLE SETS ARE DRAWN IS UNCERTAIN. PLEASE NOTIFY THE MICROBIOLOGY DEPARTMENT WITHIN ONE WEEK IF SPECIATION AND SENSITIVITIES ARE REQUIRED. Performed at [facility], [place] [address], [place], [place] [address]    Report Status 05/11/2021 FINAL  Final   Organism  ID, Bacteria METHICILLIN RESISTANT STAPHYLOCOCCUS AUREUS  Final      Susceptibility   Methicillin resistant staphylococcus aureus - MIC*    CIPROFLOXACIN <=0.5 SENSITIVE Sensitive     ERYTHROMYCIN >=8 RESISTANT Resistant     GENTAMICIN <=0.5 SENSITIVE Sensitive     OXACILLIN >=4 RESISTANT Resistant     TETRACYCLINE <=1 SENSITIVE Sensitive     VANCOMYCIN <=0.5 SENSITIVE Sensitive     TRIMETH/SULFA <=10 SENSITIVE Sensitive     CLINDAMYCIN <=0.25 SENSITIVE Sensitive     RIFAMPIN <=0.5 SENSITIVE Sensitive     Inducible Clindamycin NEGATIVE Sensitive     * METHICILLIN RESISTANT STAPHYLOCOCCUS AUREUS  Blood Culture ID Panel (Reflexed)     Status: Abnormal   Collection Time: 05/08/21 10:33 AM  Result Value Ref Range Status   Enterococcus faecalis NOT DETECTED NOT DETECTED Final   Enterococcus Faecium NOT DETECTED NOT DETECTED Final   Listeria monocytogenes NOT DETECTED NOT DETECTED Final    Staphylococcus species DETECTED (A) NOT DETECTED Final    Comment: CRITICAL RESULT CALLED TO, READ BACK BY AND VERIFIED WITH: PHARMD JAMES LEDFORD 05/09/21@4 :55 BY TW    Staphylococcus aureus (BCID) DETECTED (A) NOT DETECTED Final    Comment: Methicillin (oxacillin)-resistant Staphylococcus aureus (MRSA). MRSA is predictably resistant to beta-lactam antibiotics (except ceftaroline). Preferred therapy is vancomycin unless clinically contraindicated. Patient requires contact precautions if  hospitalized. CRITICAL RESULT CALLED TO, READ BACK BY AND VERIFIED WITH: PHARMD JAMES LEDFORD 05/09/21@4 :55 BY TW    Staphylococcus epidermidis DETECTED (A) NOT DETECTED Final    Comment: CRITICAL RESULT CALLED TO, READ BACK BY AND VERIFIED WITH: PHARMD JAMES LEDFORD 05/09/21@4 :55 BY TW    Staphylococcus lugdunensis NOT DETECTED NOT DETECTED Final   Streptococcus species NOT DETECTED NOT DETECTED Final   Streptococcus agalactiae NOT DETECTED NOT DETECTED Final   Streptococcus pneumoniae NOT DETECTED NOT DETECTED Final   Streptococcus pyogenes NOT DETECTED NOT DETECTED Final   A.calcoaceticus-baumannii NOT DETECTED NOT DETECTED Final   Bacteroides fragilis NOT DETECTED NOT DETECTED Final   Enterobacterales NOT DETECTED NOT DETECTED Final   Enterobacter cloacae complex NOT DETECTED NOT DETECTED Final   Escherichia coli NOT DETECTED NOT DETECTED Final   Klebsiella aerogenes NOT DETECTED NOT DETECTED Final   Klebsiella oxytoca NOT DETECTED NOT DETECTED Final   Klebsiella pneumoniae NOT DETECTED NOT DETECTED Final   Proteus species NOT DETECTED NOT DETECTED Final   Salmonella species NOT DETECTED NOT DETECTED Final   Serratia marcescens NOT DETECTED NOT DETECTED Final   Haemophilus influenzae NOT DETECTED NOT DETECTED Final   Neisseria meningitidis NOT DETECTED NOT DETECTED Final   Pseudomonas aeruginosa NOT DETECTED NOT DETECTED Final   Stenotrophomonas maltophilia NOT DETECTED NOT DETECTED Final    Candida albicans NOT DETECTED NOT DETECTED Final   Candida auris NOT DETECTED NOT DETECTED Final   Candida glabrata NOT DETECTED NOT DETECTED Final   Candida krusei NOT DETECTED NOT DETECTED Final   Candida parapsilosis NOT DETECTED NOT DETECTED Final   Candida tropicalis NOT DETECTED NOT DETECTED Final   Cryptococcus neoformans/gattii NOT DETECTED NOT DETECTED Final   Methicillin resistance mecA/C DETECTED (A) NOT DETECTED Final    Comment: CRITICAL RESULT CALLED TO, READ BACK BY AND VERIFIED WITH: PHARMD JAMES LEDFORD 05/09/21@4 :55 BY TW    Meth resistant mecA/C and MREJ DETECTED (A) NOT DETECTED Final    Comment: CRITICAL RESULT CALLED TO, READ BACK BY AND VERIFIED WITH: PHARMD JAMES LEDFORD 05/09/21@4 :55 BY TW Performed at [facility], [place] [address], [place], [place]  [address]   Culture, blood (routine x 2)     Status: None (Preliminary result)   Collection Time: 05/08/21 10:40 AM   Specimen: BLOOD LEFT HAND  Result Value Ref Range Status   Specimen Description BLOOD LEFT HAND  Final   Special Requests   Final    BOTTLES DRAWN AEROBIC ONLY Blood Culture results may not be optimal due to an inadequate volume of blood received in culture bottles   Culture   Final    NO GROWTH 3 DAYS Performed at [facility], [place] [address] [place], [place] [address]    Report Status PENDING  Incomplete  Culture, blood (routine x 2)     Status: None (Preliminary result)   Collection Time: 05/09/21  2:43 PM   Specimen: BLOOD LEFT HAND  Result Value Ref Range Status   Specimen Description BLOOD LEFT HAND  Final   Special Requests   Final    BOTTLES DRAWN AEROBIC ONLY Blood Culture results may not be optimal due to an inadequate volume of blood received in culture bottles   Culture   Final    NO GROWTH 2 DAYS Performed at [facility], [place] [address] [place], [place] [address]    Report Status PENDING  Incomplete  Culture, blood (routine x 2)     Status: None (Preliminary  result)   Collection Time: 05/09/21  2:51 PM   Specimen: BLOOD LEFT HAND  Result Value Ref Range Status   Specimen Description BLOOD LEFT HAND  Final   Special Requests   Final    BOTTLES DRAWN AEROBIC ONLY Blood Culture results may not be optimal due to an inadequate volume of blood received in culture bottles   Culture   Final    NO GROWTH 2 DAYS Performed at [facility], [place] [address], [place], [place] [address]    Report Status PENDING  Incomplete  Culture, blood (routine x 2)     Status: None (Preliminary result)   Collection Time: 05/10/21  6:06 AM   Specimen: BLOOD  Result Value Ref Range Status   Specimen Description BLOOD SITE NOT SPECIFIED  Final   Special Requests AEROBIC BOTTLE ONLY Blood Culture adequate volume  Final   Culture   Final    NO GROWTH 1 DAY Performed at [facility], [place] [address], [place], [place] [address]    Report Status PENDING  Incomplete  Culture, blood (routine x 2)     Status: None (Preliminary result)   Collection Time: 05/10/21  6:12 AM   Specimen: BLOOD LEFT HAND  Result Value Ref Range Status   Specimen Description BLOOD LEFT HAND  Final   Special Requests   Final    AEROBIC BOTTLE ONLY Blood Culture results may not be optimal due to an inadequate volume of blood received in culture bottles   Culture   Final    NO GROWTH 1 DAY Performed at [facility], [place] [address], [place],  [address]    Report Status PENDING  Incomplete         Radiology Studies: ECHOCARDIOGRAM COMPLETE  Result Date: 05/09/2021    ECHOCARDIOGRAM REPORT   Patient Name:   Albert Harris Date of Exam: 05/09/2021 Medical Rec #:  [id number]      Height:       68.0 in Accession #:    [id number]     Weight:       132.3 lb Date of  Birth:  1955-08-30     BSA:          1.714 m Patient Age:    62 years       BP:           135/71 mmHg Patient Gender: M              HR:           62 bpm. Exam Location:  Inpatient Procedure: 2D Echo, Cardiac Doppler  and Color Doppler Indications:    Bacteremia  History:        Patient has no prior history of Echocardiogram examinations and                 Patient has prior history of Echocardiogram examinations, most                 recent 07/18/2019. CHF; Risk Factors:Hypertension and Current                 Smoker.  Sonographer:    Merrie Roof RDCS Referring Phys: [id number] [place]  1. Left ventricular ejection fraction, by estimation, is 40 to 45%. The left ventricle has mildly decreased function. The left ventricle demonstrates global hypokinesis. There is moderate left ventricular hypertrophy. Left ventricular diastolic parameters are consistent with Grade II diastolic dysfunction (pseudonormalization). Elevated left atrial pressure.  2. Right ventricular systolic function is mildly reduced. The right ventricular size is mildly enlarged. There is moderately elevated pulmonary artery systolic pressure. The estimated right ventricular systolic pressure is 45.4 mmHg.  3. Left atrial size was severely dilated.  4. Right atrial size was severely dilated.  5. The mitral valve is normal in structure. Mild mitral valve regurgitation. No evidence of mitral stenosis.  6. The aortic valve was not well visualized. Aortic valve regurgitation is not visualized. Aortic valve sclerosis/calcification is present, without any evidence of aortic stenosis.  7. The inferior vena cava is dilated in size with <50% respiratory variability, suggesting right atrial pressure of 15 mmHg. Conclusion(s)/Recommendation(s): No evidence of valvular vegetations on this transthoracic echocardiogram. Consider a transesophageal echocardiogram to exclude infective endocarditis if clinically indicated. FINDINGS  Left Ventricle: Left ventricular ejection fraction, by estimation, is 40 to 45%. The left ventricle has mildly decreased function. The left ventricle demonstrates global hypokinesis. The left ventricular internal cavity size was normal in  size. There is  moderate left ventricular hypertrophy. Left ventricular diastolic parameters are consistent with Grade II diastolic dysfunction (pseudonormalization). Elevated left atrial pressure. Right Ventricle: The right ventricular size is mildly enlarged. Right vetricular wall thickness was not well visualized. Right ventricular systolic function is mildly reduced. There is moderately elevated pulmonary artery systolic pressure. The tricuspid  regurgitant velocity is 2.97 m/s, and with an assumed right atrial pressure of 15 mmHg, the estimated right ventricular systolic pressure is 09.8 mmHg. Left Atrium: Left atrial size was severely dilated. Right Atrium: Right atrial size was severely dilated. Pericardium: There is no evidence of pericardial effusion. Mitral Valve: The mitral valve is normal in structure. Mild mitral valve regurgitation. No evidence of mitral valve stenosis. Tricuspid Valve: The tricuspid valve is normal in structure. Tricuspid valve regurgitation is trivial. Aortic Valve: The aortic valve was not well visualized. Aortic valve regurgitation is not visualized. Aortic valve sclerosis/calcification is present, without any evidence of aortic stenosis. Aortic valve mean gradient measures 6.0 mmHg. Aortic valve peak gradient measures 13.4 mmHg. Aortic valve area, by VTI measures 1.92 cm. Pulmonic Valve: The pulmonic valve  was not well visualized. Pulmonic valve regurgitation is not visualized. Aorta: The aortic root and ascending aorta are structurally normal, with no evidence of dilitation. Venous: The inferior vena cava is dilated in size with less than 50% respiratory variability, suggesting right atrial pressure of 15 mmHg. IAS/Shunts: The interatrial septum was not well visualized.  LEFT VENTRICLE PLAX 2D LVIDd:         5.00 cm   Diastology LVIDs:         4.00 cm   LV e' medial:    3.08 cm/s LV PW:         1.40 cm   LV E/e' medial:  27.8 LV IVS:        1.40 cm   LV e' lateral:   6.23 cm/s  LVOT diam:     2.10 cm   LV E/e' lateral: 13.8 LV SV:         66 LV SV Index:   38 LVOT Area:     3.46 cm  RIGHT VENTRICLE            IVC RV Basal diam:  4.60 cm    IVC diam: 2.10 cm RV Mid diam:    3.00 cm RV S prime:     8.33 cm/s TAPSE (M-mode): 1.6 cm LEFT ATRIUM              Index        RIGHT ATRIUM           Index LA diam:        3.90 cm  2.27 cm/m   RA Area:     26.60 cm LA Vol (A2C):   129.0 ml 75.24 ml/m  RA Volume:   96.00 ml  56.00 ml/m LA Vol (A4C):   74.6 ml  43.51 ml/m LA Biplane Vol: 98.0 ml  57.16 ml/m  AORTIC VALVE AV Area (Vmax):    2.33 cm AV Area (Vmean):   2.22 cm AV Area (VTI):     1.92 cm AV Vmax:           183.00 cm/s AV Vmean:          111.000 cm/s AV VTI:            0.342 m AV Peak Grad:      13.4 mmHg AV Mean Grad:      6.0 mmHg LVOT Vmax:         123.00 cm/s LVOT Vmean:        71.300 cm/s LVOT VTI:          0.190 m LVOT/AV VTI ratio: 0.56  AORTA Ao Root diam: 3.10 cm Ao Asc diam:  3.00 cm MITRAL VALVE               TRICUSPID VALVE MV Area (PHT): 2.69 cm    TR Peak grad:   35.3 mmHg MV Decel Time: 282 msec    TR Vmax:        297.00 cm/s MV E velocity: 85.70 cm/s MV A velocity: 51.60 cm/s  SHUNTS MV E/A ratio:  1.66        Systemic VTI:  0.19 m                            Systemic Diam: 2.10 cm [name] MD Electronically signed by [name] MD Signature Date/Time: 05/09/2021/5:51:14 PM    Final         Scheduled  Meds:  calcium acetate  1,334 mg Oral TID WC   Chlorhexidine Gluconate Cloth  6 each Topical Q0600   famotidine  20 mg Oral Daily   heparin  5,000 Units Subcutaneous Q8H   lidocaine  1 patch Transdermal Q24H   metoprolol tartrate  25 mg Oral BID   potassium chloride  40 mEq Oral Once   sodium chloride flush  3 mL Intravenous Q12H   Continuous Infusions:  vancomycin       LOS: 6 days    Time spent: 30 minutes    Phillips Climes, MD [facility] Pager [phone]

## 2021-05-12 DIAGNOSIS — N186 End stage renal disease: Secondary | ICD-10-CM | POA: Diagnosis not present

## 2021-05-12 DIAGNOSIS — R7881 Bacteremia: Secondary | ICD-10-CM | POA: Diagnosis not present

## 2021-05-12 LAB — COMPREHENSIVE METABOLIC PANEL
ALT: 368 U/L — ABNORMAL HIGH (ref 0–44)
AST: 43 U/L — ABNORMAL HIGH (ref 15–41)
Albumin: 3.2 g/dL — ABNORMAL LOW (ref 3.5–5.0)
Alkaline Phosphatase: 109 U/L (ref 38–126)
Anion gap: 14 (ref 5–15)
BUN: 47 mg/dL — ABNORMAL HIGH (ref 8–23)
CO2: 20 mmol/L — ABNORMAL LOW (ref 22–32)
Calcium: 8.7 mg/dL — ABNORMAL LOW (ref 8.9–10.3)
Chloride: 101 mmol/L (ref 98–111)
Creatinine, Ser: 6.7 mg/dL — ABNORMAL HIGH (ref 0.61–1.24)
GFR, Estimated: 8 mL/min — ABNORMAL LOW (ref >60)
Glucose, Bld: 108 mg/dL — ABNORMAL HIGH (ref 70–99)
Potassium: 5.3 mmol/L — ABNORMAL HIGH (ref 3.5–5.1)
Sodium: 135 mmol/L (ref 135–145)
Total Bilirubin: 1.6 mg/dL — ABNORMAL HIGH (ref 0.3–1.2)
Total Protein: 6.3 g/dL — ABNORMAL LOW (ref 6.5–8.1)

## 2021-05-12 LAB — MRSA NEXT GEN BY PCR, NASAL: MRSA by PCR Next Gen: DETECTED — AB

## 2021-05-12 MED ORDER — DIPHENHYDRAMINE HCL 25 MG PO CAPS
25.0000 mg | ORAL_CAPSULE | Freq: Four times a day (QID) | ORAL | Status: DC | PRN
  Administered 2021-05-12 – 2021-05-13 (×2): 25 mg via ORAL
  Filled 2021-05-12 (×2): qty 1

## 2021-05-12 NOTE — Progress Notes (Signed)
PROGRESS NOTE    Albert Harris  XQJ:[id number] DOB: Nov 01, 1955 DOA: 05/05/2021 PCP: Kerin Perna, NP    Brief Narrative:   65 year old gentleman with history of ESRD on hemodialysis, COPD, peripheral vascular disease, hypertension, hyperlipidemia and severe noncompliance to dialysis regimen presented with fall at home.  Not going to hemodialysis since last 1 week.  Not eating well since Thanksgiving as per family.  In the emergency room hemodynamically stable.  Potassium 7. AST and ALT 2428/2294 with bilirubin of 2.  Urgently hemodialyzed.  Skeletal survey negative.  Patient has clinically improved and prepared to discharge after hemodialysis on 11/30.  He became very confused and combative so discharge was canceled.  Patient remained encephalopathic overnight with no obvious known cause. 12/2, most of the delirium improved now.   -Blood culture growing MRSA.     Assessment & Plan:   Principal Problem:   Hyperkalemia Active Problems:   ESRD (end stage renal disease) (HCC)   Hypertension   COPD (chronic obstructive pulmonary disease) (HCC)   Elevated LFTs   Hypoglycemia   Thrombocytopenia (HCC)   Fall at home, initial encounter   Bacteremia due to Gram-positive bacteria   Acute metabolic encephalopathy  Acute metabolic encephalopathy: Developed in the hospital.  Primary cause unknown. -No drugs or alcohol -CT head was normal.  Subsequent MRI with a small subdural possibly happened after a fall in the bathroom but no other complications. -Ammonia, salicylate, Tylenol levels normal.  Blood gases essentially normal. -LFTs are already improving and downtrending. -Adequately stabilizing.  Continue fall precautions delirium precautions.  Avoid any benzodiazepines or opiates. -Patient has improved, back to baseline  MRSA bacteremia -Follow on repeat blood cultures, meanwhile keep empirically on IV vancomycin, patient will need TEE, plan for tomorrow to determine length of  treatment.  ESRD on hemodialysis, missed hemodialysis resulting in hyperkalemia: Treated with temporizing measures with calcium gluconate, insulin and dextrose infusion as well bicarbonate. Patient underwent consecutive dialysis sessions with improvement.  Now he is back on TTS schedule, plan for dialysis tomorrow.  Hyperkalemia/hypokalemia -management with HD   Elevated LFTs:  -Likely due to shock liver from hypotension, CT abdomen pelvis with no acute findings, no acute finding on right upper quadrant ultrasound, -trending down   Hypoglycemia: Due to poor oral intake and insulin use per correction of hyperkalemia.  Regular diet.  Blood sugars stabilized after eating regular diet.  COPD: Stable.  Albuterol as needed.   Fall: Patient has right lateral rib pain and tenderness that is reproducible.  Skeletal survey negative.  Local lidocaine patch. -Patient with very small some subdural tentorial bleed, discussed with neurosurgery Dr. Kathyrn Sheriff, patient is asymptomatic, no contraindication for subcu heparin for DVT prophylaxis.   Continue attempt to mobilize.  Monitor for positive blood cultures.  Anticipate discharge home tomorrow if contaminant cultures.  DVT prophylaxis: heparin injection 5,000 Units Start: 05/06/21 0000   Code Status: Full code Family Communication: None at bedside  Disposition Plan: Status is: Inpatient  Remains inpatient appropriate because: Pending TEE         Consultants:  Nephrology id  Procedures:  Hemodialysis  Antimicrobials:  Vancomycin 12/1---   Subjective:  Denies any complaints today, no significant events overnight.  Objective: Vitals:   05/12/21 0507 05/12/21 0743 05/12/21 0800 05/12/21 1159  BP:  (!) 170/98 (!) 152/77 (!) 156/95  Pulse: 66 70 73 61  Resp: 17 18 17 20   Temp:  (!) 97.3 F (36.3 C)  (!) 97.4 F (36.3 C)  TempSrc:  Oral  Oral  SpO2: 93% 96% 96%   Weight: 61.1 kg     Height: 5\' 8"  (1.727 m)        Intake/Output Summary (Last 24 hours) at 05/12/2021 1526 Last data filed at 05/12/2021 0600 Gross per 24 hour  Intake 483 ml  Output --  Net 483 ml    Filed Weights   05/10/21 1339 05/10/21 1745 05/12/21 0507  Weight: 60.7 kg 59 kg 61.1 kg    Examination:  Awake Alert, Oriented X 3, No new F.N deficits, Normal affect Symmetrical Chest wall movement, Good air movement bilaterally, CTAB RRR,No Gallops,Rubs or new Murmurs, No Parasternal Heave +ve B.Sounds, Abd Soft, No tenderness, No rebound - guarding or rigidity. No Cyanosis, Clubbing or edema, No new Rash or bruise         Data Reviewed: I have personally reviewed following labs and imaging studies  CBC: Recent Labs  Lab 05/05/21 1534 05/06/21 0100 05/06/21 0300 05/08/21 1435 05/08/21 2048 05/10/21 1330  WBC 13.0* 11.7* 11.8* 7.8 7.2 7.4  NEUTROABS 10.5*  --   --   --   --   --   HGB 12.0* 10.7* 11.0* 12.7* 18.3* 11.1*  HCT 38.5* 34.1* 34.0* 39.8 58.6* 34.1*  MCV 101.3* 100.3* 98.0 96.8 98.5 97.2  PLT 102* 97* 94* 80* 67* 67*   Basic Metabolic Panel: Recent Labs  Lab 05/06/21 0300 05/07/21 0217 05/08/21 0241 05/09/21 0303 05/10/21 0606 05/11/21 0049 05/12/21 0143  NA 139   < > 135 136 140 137 135  K 6.0*   < > 5.3* 4.1 3.9 2.8* 5.3*  CL 95*   < > 96* 95* 102 100 101  CO2 22   < > 19* 26 22 27  20*  GLUCOSE 84   < > 76 74 114* 160* 108*  BUN 107*   < > 30* 24* 48* 18 47*  CREATININE 16.65*   < > 6.59* 5.10* 7.87* 4.14* 6.70*  CALCIUM 9.4   < > 8.6* 8.1* 8.0* 7.7* 8.7*  PHOS 11.3*  --   --   --   --   --   --    < > = values in this interval not displayed.   GFR: Estimated Creatinine Clearance: 9.4 mL/min (A) (by C-G formula based on SCr of 6.7 mg/dL (H)). Liver Function Tests: Recent Labs  Lab 05/08/21 0241 05/09/21 0303 05/10/21 0606 05/11/21 0049 05/12/21 0143  AST 462* 218* 62* 40 43*  ALT 1,356* 1,066* 664* 492* 368*  ALKPHOS 119 108 103 99 109  BILITOT 2.8* 3.5* 2.1* 2.0* 1.6*   PROT 5.8* 6.0* 5.6* 5.7* 6.3*  ALBUMIN 3.1* 3.1* 2.9* 2.9* 3.2*   No results for input(s): LIPASE, AMYLASE in the last 168 hours. Recent Labs  Lab 05/08/21 1020  AMMONIA 36*   Coagulation Profile: Recent Labs  Lab 05/06/21 0100 05/09/21 0303  INR 2.8* 1.3*   Cardiac Enzymes: No results for input(s): CKTOTAL, CKMB, CKMBINDEX, TROPONINI in the last 168 hours. BNP (last 3 results) No results for input(s): PROBNP in the last 8760 hours. HbA1C: No results for input(s): HGBA1C in the last 72 hours. CBG: Recent Labs  Lab 05/06/21 0739 05/06/21 0804 05/06/21 0833 05/06/21 1822 05/07/21 1842  GLUCAP 58* 98 93 111* 85   Lipid Profile: No results for input(s): CHOL, HDL, LDLCALC, TRIG, CHOLHDL, LDLDIRECT in the last 72 hours. Thyroid Function Tests: No results for input(s): TSH, T4TOTAL, FREET4, T3FREE, THYROIDAB in the last 72 hours. Anemia Panel: No  results for input(s): VITAMINB12, FOLATE, FERRITIN, TIBC, IRON, RETICCTPCT in the last 72 hours. Sepsis Labs: No results for input(s): PROCALCITON, LATICACIDVEN in the last 168 hours.  Recent Results (from the past 240 hour(s))  Resp Panel by RT-PCR (Flu A&B, Covid) Nasopharyngeal Swab     Status: None   Collection Time: 05/05/21 10:42 PM   Specimen: Nasopharyngeal Swab; Nasopharyngeal(NP) swabs in vial transport medium  Result Value Ref Range Status   SARS Coronavirus 2 by RT PCR NEGATIVE NEGATIVE Final    Comment: (NOTE) SARS-CoV-2 target nucleic acids are NOT DETECTED.  The SARS-CoV-2 RNA is generally detectable in upper respiratory specimens during the acute phase of infection. The lowest concentration of SARS-CoV-2 viral copies this assay can detect is 138 copies/mL. A negative result does not preclude SARS-Cov-2 infection and should not be used as the sole basis for treatment or other patient management decisions. A negative result may occur with  improper specimen collection/handling, submission of specimen  other than nasopharyngeal swab, presence of viral mutation(s) within the areas targeted by this assay, and inadequate number of viral copies(<138 copies/mL). A negative result must be combined with clinical observations, patient history, and epidemiological information. The expected result is Negative.  Fact Sheet for Patients:  EntrepreneurPulse.com.au  Fact Sheet for Healthcare Providers:  IncredibleEmployment.be  This test is no t yet approved or cleared by the [place] FDA and  has been authorized for detection and/or diagnosis of SARS-CoV-2 by FDA under an Emergency Use Authorization (EUA). This EUA will remain  in effect (meaning this test can be used) for the duration of the COVID-19 declaration under Section 564(b)(1) of the Act, 21 U.S.C.section 360bbb-3(b)(1), unless the authorization is terminated  or revoked sooner.       Influenza A by PCR NEGATIVE NEGATIVE Final   Influenza B by PCR NEGATIVE NEGATIVE Final    Comment: (NOTE) The Xpert Xpress SARS-CoV-2/FLU/RSV plus assay is intended as an aid in the diagnosis of influenza from Nasopharyngeal swab specimens and should not be used as a sole basis for treatment. Nasal washings and aspirates are unacceptable for Xpert Xpress SARS-CoV-2/FLU/RSV testing.  Fact Sheet for Patients: EntrepreneurPulse.com.au  Fact Sheet for Healthcare Providers: IncredibleEmployment.be  This test is not yet approved or cleared by the [place] FDA and has been authorized for detection and/or diagnosis of SARS-CoV-2 by FDA under an Emergency Use Authorization (EUA). This EUA will remain in effect (meaning this test can be used) for the duration of the COVID-19 declaration under Section 564(b)(1) of the Act, 21 U.S.C. section 360bbb-3(b)(1), unless the authorization is terminated or revoked.  Performed at [facility], [place] [address] [place],  [place] [address]   Culture, blood (routine x 2)     Status: Abnormal   Collection Time: 05/08/21 10:33 AM   Specimen: BLOOD  Result Value Ref Range Status   Specimen Description BLOOD LEFT ANTECUBITAL  Final   Special Requests   Final    BOTTLES DRAWN AEROBIC AND ANAEROBIC Blood Culture results may not be optimal due to an inadequate volume of blood received in culture bottles   Culture  Setup Time   Final    GRAM POSITIVE COCCI IN BOTH AEROBIC AND ANAEROBIC BOTTLES CRITICAL RESULT CALLED TO, READ BACK BY AND VERIFIED WITH: PHARMD JAMES LEDFORD 05/09/21@4 :55 BY TW    Culture (A)  Final    METHICILLIN RESISTANT STAPHYLOCOCCUS AUREUS STAPHYLOCOCCUS EPIDERMIDIS THE SIGNIFICANCE OF ISOLATING THIS ORGANISM FROM A SINGLE SET OF BLOOD CULTURES WHEN MULTIPLE  SETS ARE DRAWN IS UNCERTAIN. PLEASE NOTIFY THE MICROBIOLOGY DEPARTMENT WITHIN ONE WEEK IF SPECIATION AND SENSITIVITIES ARE REQUIRED. Performed at [facility], [place] [address], [place], [place] [address]    Report Status 05/11/2021 FINAL  Final   Organism ID, Bacteria METHICILLIN RESISTANT STAPHYLOCOCCUS AUREUS  Final      Susceptibility   Methicillin resistant staphylococcus aureus - MIC*    CIPROFLOXACIN <=0.5 SENSITIVE Sensitive     ERYTHROMYCIN >=8 RESISTANT Resistant     GENTAMICIN <=0.5 SENSITIVE Sensitive     OXACILLIN >=4 RESISTANT Resistant     TETRACYCLINE <=1 SENSITIVE Sensitive     VANCOMYCIN <=0.5 SENSITIVE Sensitive     TRIMETH/SULFA <=10 SENSITIVE Sensitive     CLINDAMYCIN <=0.25 SENSITIVE Sensitive     RIFAMPIN <=0.5 SENSITIVE Sensitive     Inducible Clindamycin NEGATIVE Sensitive     * METHICILLIN RESISTANT STAPHYLOCOCCUS AUREUS  Blood Culture ID Panel (Reflexed)     Status: Abnormal   Collection Time: 05/08/21 10:33 AM  Result Value Ref Range Status   Enterococcus faecalis NOT DETECTED NOT DETECTED Final   Enterococcus Faecium NOT DETECTED NOT DETECTED Final   Listeria monocytogenes NOT DETECTED NOT DETECTED Final    Staphylococcus species DETECTED (A) NOT DETECTED Final    Comment: CRITICAL RESULT CALLED TO, READ BACK BY AND VERIFIED WITH: PHARMD JAMES LEDFORD 05/09/21@4 :55 BY TW    Staphylococcus aureus (BCID) DETECTED (A) NOT DETECTED Final    Comment: Methicillin (oxacillin)-resistant Staphylococcus aureus (MRSA). MRSA is predictably resistant to beta-lactam antibiotics (except ceftaroline). Preferred therapy is vancomycin unless clinically contraindicated. Patient requires contact precautions if  hospitalized. CRITICAL RESULT CALLED TO, READ BACK BY AND VERIFIED WITH: PHARMD JAMES LEDFORD 05/09/21@4 :55 BY TW    Staphylococcus epidermidis DETECTED (A) NOT DETECTED Final    Comment: CRITICAL RESULT CALLED TO, READ BACK BY AND VERIFIED WITH: PHARMD JAMES LEDFORD 05/09/21@4 :55 BY TW    Staphylococcus lugdunensis NOT DETECTED NOT DETECTED Final   Streptococcus species NOT DETECTED NOT DETECTED Final   Streptococcus agalactiae NOT DETECTED NOT DETECTED Final   Streptococcus pneumoniae NOT DETECTED NOT DETECTED Final   Streptococcus pyogenes NOT DETECTED NOT DETECTED Final   A.calcoaceticus-baumannii NOT DETECTED NOT DETECTED Final   Bacteroides fragilis NOT DETECTED NOT DETECTED Final   Enterobacterales NOT DETECTED NOT DETECTED Final   Enterobacter cloacae complex NOT DETECTED NOT DETECTED Final   Escherichia coli NOT DETECTED NOT DETECTED Final   Klebsiella aerogenes NOT DETECTED NOT DETECTED Final   Klebsiella oxytoca NOT DETECTED NOT DETECTED Final   Klebsiella pneumoniae NOT DETECTED NOT DETECTED Final   Proteus species NOT DETECTED NOT DETECTED Final   Salmonella species NOT DETECTED NOT DETECTED Final   Serratia marcescens NOT DETECTED NOT DETECTED Final   Haemophilus influenzae NOT DETECTED NOT DETECTED Final   Neisseria meningitidis NOT DETECTED NOT DETECTED Final   Pseudomonas aeruginosa NOT DETECTED NOT DETECTED Final   Stenotrophomonas maltophilia NOT DETECTED NOT DETECTED Final    Candida albicans NOT DETECTED NOT DETECTED Final   Candida auris NOT DETECTED NOT DETECTED Final   Candida glabrata NOT DETECTED NOT DETECTED Final   Candida krusei NOT DETECTED NOT DETECTED Final   Candida parapsilosis NOT DETECTED NOT DETECTED Final   Candida tropicalis NOT DETECTED NOT DETECTED Final   Cryptococcus neoformans/gattii NOT DETECTED NOT DETECTED Final   Methicillin resistance mecA/C DETECTED (A) NOT DETECTED Final    Comment: CRITICAL RESULT CALLED TO, READ BACK BY AND VERIFIED WITH: PHARMD JAMES LEDFORD 05/09/21@4 :55 BY TW  Meth resistant mecA/C and MREJ DETECTED (A) NOT DETECTED Final    Comment: CRITICAL RESULT CALLED TO, READ BACK BY AND VERIFIED WITH: PHARMD JAMES LEDFORD 05/09/21@4 :55 BY TW Performed at [facility], [place] [address], [place], [place] [address]   Culture, blood (routine x 2)     Status: None (Preliminary result)   Collection Time: 05/08/21 10:40 AM   Specimen: BLOOD LEFT HAND  Result Value Ref Range Status   Specimen Description BLOOD LEFT HAND  Final   Special Requests   Final    BOTTLES DRAWN AEROBIC ONLY Blood Culture results may not be optimal due to an inadequate volume of blood received in culture bottles   Culture   Final    NO GROWTH 4 DAYS Performed at [facility], [place] [address] [place], [place] [address]    Report Status PENDING  Incomplete  Culture, blood (routine x 2)     Status: None (Preliminary result)   Collection Time: 05/09/21  2:43 PM   Specimen: BLOOD LEFT HAND  Result Value Ref Range Status   Specimen Description BLOOD LEFT HAND  Final   Special Requests   Final    BOTTLES DRAWN AEROBIC ONLY Blood Culture results may not be optimal due to an inadequate volume of blood received in culture bottles   Culture   Final    NO GROWTH 3 DAYS Performed at [facility], [place] [address], [place], [place] [address]    Report Status PENDING  Incomplete  Culture, blood (routine x 2)     Status: None (Preliminary  result)   Collection Time: 05/09/21  2:51 PM   Specimen: BLOOD LEFT HAND  Result Value Ref Range Status   Specimen Description BLOOD LEFT HAND  Final   Special Requests   Final    BOTTLES DRAWN AEROBIC ONLY Blood Culture results may not be optimal due to an inadequate volume of blood received in culture bottles   Culture   Final    NO GROWTH 3 DAYS Performed at [facility], [place] [address] [place], [place] [address]    Report Status PENDING  Incomplete  Culture, blood (routine x 2)     Status: None (Preliminary result)   Collection Time: 05/10/21  6:06 AM   Specimen: BLOOD  Result Value Ref Range Status   Specimen Description BLOOD SITE NOT SPECIFIED  Final   Special Requests AEROBIC BOTTLE ONLY Blood Culture adequate volume  Final   Culture   Final    NO GROWTH 2 DAYS Performed at [facility], [place] [name], [place], [place] [address]    Report Status PENDING  Incomplete  Culture, blood (routine x 2)     Status: None (Preliminary result)   Collection Time: 05/10/21  6:12 AM   Specimen: BLOOD LEFT HAND  Result Value Ref Range Status   Specimen Description BLOOD LEFT HAND  Final   Special Requests   Final    AEROBIC BOTTLE ONLY Blood Culture results may not be optimal due to an inadequate volume of blood received in culture bottles   Culture   Final    NO GROWTH 2 DAYS Performed at [facility], [place] [address] [place], [place] [address]    Report Status PENDING  Incomplete         Radiology Studies: No results found.      Scheduled Meds:  calcium acetate  1,334 mg Oral TID WC   Chlorhexidine  Gluconate Cloth  6 each Topical Q0600   famotidine  20 mg Oral Daily   heparin  5,000 Units Subcutaneous Q8H   lidocaine  1 patch Transdermal Q24H   metoprolol tartrate  25 mg Oral BID   sodium chloride flush  3 mL Intravenous Q12H   Continuous Infusions:  vancomycin       LOS: 7 days    Time spent: 45 minutes    Phillips Climes, MD [facility] Pager [phone]

## 2021-05-12 NOTE — Progress Notes (Signed)
    [facility] has been requested to perform a transesophageal echocardiogram on Albert Harris for Bacteremia.  After careful review of history and examination, the risks and benefits of transesophageal echocardiogram have been explained including risks of esophageal damage, perforation (1:10,000 risk), bleeding, pharyngeal hematoma as well as other potential complications associated with conscious sedation including aspiration, arrhythmia, respiratory failure and death. Alternatives to treatment were discussed, questions were answered. Patient is willing to proceed.  TEE - Dr. Gardiner Rhyme @ 1230 . NPO after midnight. Meds with sips.   Leanor Kail, PA-C 05/12/2021 2:22 PM

## 2021-05-12 NOTE — Progress Notes (Signed)
Pharmacy Antibiotic Note  Albert Harris is a 65 y.o. male admitted on 05/05/2021 with MRSA + MRSE bacteremia.  Pharmacy has been consulted for Vancomycin dosing.  Patient with ESRD on HD Tuesday, Thursday, and Saturday. Last HD on Saturday with full session tolerated. Currently on Vancomycin with HD.  ID on board. Plan for TEE.   Plan: Continue Vancomycin 750mg  IV every HD - T/Th/S.  Next HD planned for Tuesday 12/6.  Plan for pre-HD level at steady state.   Height: 5\' 8"  (172.7 cm) Weight: 61.1 kg (134 lb 11.2 oz) IBW/kg (Calculated) : 68.4  Temp (24hrs), Avg:98 F (36.7 C), Min:97.3 F (36.3 C), Max:98.4 F (36.9 C)  Recent Labs  Lab 05/06/21 0100 05/06/21 0300 05/07/21 0217 05/08/21 0241 05/08/21 1435 05/08/21 2048 05/09/21 0303 05/10/21 0606 05/10/21 1330 05/11/21 0049 05/12/21 0143  WBC 11.7* 11.8*  --   --  7.8 7.2  --   --  7.4  --   --   CREATININE 16.33* 16.65*   < > 6.59*  --   --  5.10* 7.87*  --  4.14* 6.70*   < > = values in this interval not displayed.    Estimated Creatinine Clearance: 9.4 mL/min (A) (by C-G formula based on SCr of 6.7 mg/dL (H)).    No Known Allergies  Antimicrobials this admission: Vancomycin 12/2 >>  Dose adjustments this admission:   Microbiology results: 12/1 blood: 2 out of 3 with MRSA and MRSE 12/2 blood: ngtd x3 12/3 blood: ngtd x2  Thank you for allowing pharmacy to be a part of this patient's care.  Sloan Leiter, PharmD, BCPS, BCCCP Clinical Pharmacist Please refer to [facility] for [place] numbers 05/12/2021 10:30 AM

## 2021-05-12 NOTE — Progress Notes (Signed)
Occupational Therapy Treatment Patient Details Name: Albert Harris MRN: [id number] DOB: 02-Sep-1955 Today's Date: 05/12/2021   History of present illness Pt is a 65 y/o male admitted 11/28 after a fall at home. Pt with low back pain from fall but imaging negative.  Found with hyperkalemia, hyperglycemia in setting of nonadherence to hemodialysis. Fall 11/30 with CT negative, MRI with small subdural tentorial bleed.  PMH includes: ESRD on TTS HD, COPD, PVD, HTN, PFO, PNA, CVA, [name].   OT comments  Patient progressing well towards goals. Completing transfers, mobility in room and ADLs with supervision today using RW.  Min cueing for safety and problem solving. Able to complete short blessed test today with questionable impairments, scoring 8/28 with deficits seen in sequencing and recall.  Continue to recommend constant support at home for safety, balance and cognition. Will follow acutely.    Recommendations for follow up therapy are one component of a multi-disciplinary discharge planning process, led by the attending physician.  Recommendations may be updated based on patient status, additional functional criteria and insurance authorization.    Follow Up Recommendations  Home health OT    Assistance Recommended at Discharge Frequent or constant Supervision/Assistance  Equipment Recommendations  BSC/3in1;Other (comment) (RW)    Recommendations for Other Services      Precautions / Restrictions Precautions Precautions: Fall Restrictions Weight Bearing Restrictions: No       Mobility Bed Mobility Overal bed mobility: Modified Independent             General bed mobility comments: no assist required    Transfers Overall transfer level: Needs assistance Equipment used: Rolling walker (2 wheels) Transfers: Sit to/from Stand Sit to Stand: Min guard           General transfer comment: for safety     Balance Overall balance assessment: Needs assistance Sitting-balance  support: Feet supported Sitting balance-Leahy Scale: Good     Standing balance support: Bilateral upper extremity supported;No upper extremity supported;During functional activity Standing balance-Leahy Scale: Fair Standing balance comment: relies on BUE support dynamically                           ADL either performed or assessed with clinical judgement   ADL Overall ADL's : Needs assistance/impaired     Grooming: Min guard;Wash/dry hands;Wash/dry face;Oral care;Standing           Upper Body Dressing : Set up;Sitting   Lower Body Dressing: Min guard;Sit to/from stand   Toilet Transfer: Min guard;Ambulation;Rolling walker (2 wheels)           Functional mobility during ADLs: Min guard;Rolling walker (2 wheels) General ADL Comments: cueing for safety    Extremity/Trunk Assessment              Vision   Additional Comments: [facility]   Perception     Praxis      Cognition Arousal/Alertness: Awake/alert Behavior During Therapy: Flat affect Overall Cognitive Status: Impaired/Different from baseline Area of Impairment: Attention;Problem solving;Following commands;Safety/judgement;Awareness;Memory                   Current Attention Level: Selective Memory: Decreased short-term memory Following Commands: Follows one step commands consistently;Follows one step commands with increased time;Follows multi-step commands inconsistently Safety/Judgement: Decreased awareness of safety;Decreased awareness of deficits Awareness: Emergent Problem Solving: Slow processing;Difficulty sequencing;Requires verbal cues General Comments: short blessed test with deficits in [facility] and sequencing- scored 8/28 questionble impairement  Exercises     Shoulder Instructions       General Comments VSS on RA    Pertinent Vitals/ Pain       Pain Assessment: No/denies pain  Home Living                                          Prior  Functioning/Environment              Frequency  Min 2X/week        Progress Toward Goals  OT Goals(current goals can now be found in the care plan section)  Progress towards OT goals: Progressing toward goals  Acute Rehab OT Goals Patient Stated Goal: to get home OT Goal Formulation: With patient Time For Goal Achievement: 05/20/21 Potential to Achieve Goals: Good  Plan Discharge plan remains appropriate;Frequency remains appropriate    Co-evaluation                 AM-PAC OT "6 Clicks" Daily Activity     Outcome Measure   Help from another person eating meals?: A Little Help from another person taking care of personal grooming?: A Little Help from another person toileting, which includes using toliet, bedpan, or urinal?: A Little Help from another person bathing (including washing, rinsing, drying)?: A Little Help from another person to put on and taking off regular upper body clothing?: A Little Help from another person to put on and taking off regular lower body clothing?: A Little 6 Click Score: 18    End of Session Equipment Utilized During Treatment: Rolling walker (2 wheels)  OT Visit Diagnosis: Other abnormalities of gait and mobility (R26.89);Muscle weakness (generalized) (M62.81);Other symptoms and signs involving cognitive function;History of falling (Z91.81)   Activity Tolerance Patient tolerated treatment well   Patient Left in chair;with call bell/phone within reach;with chair alarm set;with nursing/sitter in room   Nurse Communication Mobility status        Time: 9024-0973 OT Time Calculation (min): 26 min  Charges: OT General Charges $OT Visit: 1 Visit OT Treatments $Self Care/Home Management : 23-37 mins  [place] Pager [phone] Office [phone]   Delight Stare 05/12/2021, 12:07 PM

## 2021-05-12 NOTE — Progress Notes (Signed)
[facility] for Infectious Disease  Date of Admission:  05/05/2021   Total days of inpatient antibiotics 5  Principal Problem:   Hyperkalemia Active Problems:   ESRD (end stage renal disease) (HCC)   Hypertension   COPD (chronic obstructive pulmonary disease) (HCC)   Elevated LFTs   Hypoglycemia   Thrombocytopenia (HCC)   Fall at home, initial encounter   Bacteremia due to Gram-positive bacteria   Acute metabolic encephalopathy          Assessment: 31 YM with ESRD on HD admitted with hyperkalemia(Hx of non-adherence to dialysis) and elevated LFTs after a fall at home. He  was planned for discharge until he became confused.  MRI brain showed trace subdural hematoma. Found to have MRSA bacteremia.   #MRSA bacteremia in pt on dialysis via right AVF #Elevated LFTs-trending down -TTE did not show vegetation. Would consider TEE in this pt as it is unclear how long he has been bacteremic. -He has used his right for-arm fistula  since February this year Recommendations:  -Follow  repeat blood Cx to ensure clearance -Continue vancomycin, final duration pending TEE.  -Given ESRD and iHD port of entry was likely his fistula. TEE pending.   Microbiology:   Antibiotics:   12/1-p  Vancomycin Cultures: Blood 12/01 1/2 MRSA and staph epi  12/02 pending  SUBJECTIVE: Pt is resting in bed. Denies fever, chills, N,V,D.   Review of Systems: Review of Systems  All other systems reviewed and are negative.   Scheduled Meds:  calcium acetate  1,334 mg Oral TID WC   Chlorhexidine Gluconate Cloth  6 each Topical Q0600   famotidine  20 mg Oral Daily   heparin  5,000 Units Subcutaneous Q8H   lidocaine  1 patch Transdermal Q24H   metoprolol tartrate  25 mg Oral BID   sodium chloride flush  3 mL Intravenous Q12H   Continuous Infusions:  vancomycin     PRN Meds:.albuterol, dextrose, diphenhydrAMINE, haloperidol **OR** haloperidol lactate, labetalol, ondansetron **OR**  ondansetron (ZOFRAN) IV, senna-docusate No Known Allergies  OBJECTIVE: Vitals:   05/12/21 0507 05/12/21 0743 05/12/21 0800 05/12/21 1159  BP:  (!) 170/98 (!) 152/77 (!) 156/95  Pulse: 66 70 73 61  Resp: 17 18 17 20   Temp:  (!) 97.3 F (36.3 C)  (!) 97.4 F (36.3 C)  TempSrc:  Oral  Oral  SpO2: 93% 96% 96%   Weight: 61.1 kg     Height: 5\' 8"  (1.727 m)      Body mass index is 20.48 kg/m.  Physical Exam Constitutional:      General: He is not in acute distress.    Appearance: He is normal weight. He is not toxic-appearing.  HENT:     Head: Normocephalic and atraumatic.     Right Ear: External ear normal.     Left Ear: External ear normal.     Nose: No congestion or rhinorrhea.     Mouth/Throat:     Mouth: Mucous membranes are moist.     Pharynx: Oropharynx is clear.  Eyes:     Extraocular Movements: Extraocular movements intact.     Conjunctiva/sclera: Conjunctivae normal.     Pupils: Pupils are equal, round, and reactive to light.  Cardiovascular:     Rate and Rhythm: Normal rate and regular rhythm.     Heart sounds: No murmur heard.   No friction rub. No gallop.     Comments: Right AVF Pulmonary:  Effort: Pulmonary effort is normal.     Breath sounds: Normal breath sounds.  Abdominal:     General: Abdomen is flat. Bowel sounds are normal.     Palpations: Abdomen is soft.  Musculoskeletal:        General: No swelling. Normal range of motion.     Cervical back: Normal range of motion and neck supple.  Skin:    General: Skin is warm and dry.  Neurological:     General: No focal deficit present.     Mental Status: He is oriented to person, place, and time.  Psychiatric:        Mood and Affect: Mood normal.      Lab Results Lab Results  Component Value Date   WBC 7.4 05/10/2021   HGB 11.1 (L) 05/10/2021   HCT 34.1 (L) 05/10/2021   MCV 97.2 05/10/2021   PLT 67 (L) 05/10/2021    Lab Results  Component Value Date   CREATININE 6.70 (H) 05/12/2021    BUN 47 (H) 05/12/2021   NA 135 05/12/2021   K 5.3 (H) 05/12/2021   CL 101 05/12/2021   CO2 20 (L) 05/12/2021    Lab Results  Component Value Date   ALT 368 (H) 05/12/2021   AST 43 (H) 05/12/2021   ALKPHOS 109 05/12/2021   BILITOT 1.6 (H) 05/12/2021        Laurice Record, [place] [facility] 05/12/2021, 3:12 PM

## 2021-05-12 NOTE — Progress Notes (Signed)
Subjective: Seen in room up in bedside chair, alert coherent x3 no complaints.  Next HD tomorrow Tuesday on schedule/?  TEE today to evaluate MRSA bacteremia per minute  Objective Vital signs in last 24 hours: Vitals:   05/11/21 2347 05/12/21 0400 05/12/21 0507 05/12/21 0743  BP: (!) 157/85 (!) 157/94  (!) 170/98  Pulse: 70 68 66 70  Resp: 18 (!) 22 17 18   Temp: 98 F (36.7 C) 97.7 F (36.5 C)  (!) 97.3 F (36.3 C)  TempSrc: Oral Oral  Oral  SpO2: 95% 97% 93% 96%  Weight:   61.1 kg   Height:   5\' 8"  (1.727 m)    Weight change: 0.4 kg  Physical Exam: General: Thin Male chronically ill, NAD Heart: RRR no MRG Lungs: CTA bilaterally nonlabored breathing Abdomen: NABS, soft NTND Extremities: No pedal edema, skin with scattered areas of bruising Dialysis Access: R FA AVGG positive bruit no sign of infection  Dialysis Orders: Center: [facility]  on TTS EDW 70kg HD Bath 2K/2Ca  Time 4 hours Heparin none. Access R forearm AVG BFR 350 DFR 800    Micera 100 mcg every 2 weeks   Problem/Plan:  Hyperkalemia: On admit,2/2 HD noncompliance, this a.m. 5.3  Hypoglycemia - Improved.  Encephalopathy -now resolved etiology possible = mostly bacteremia/ uremia missed dialysis  CT HD =no acute findings.  Ammonia, salicylate and tylenol levels normal.Brain MRI = small subdural, possible vertebral artery stenosis/attending spoke with NS okay for subcu heparin for DVT prophylaxis, hold IV heparin on HD now MRSA bacteremia: Bcx 12/2 +, repeat 12/3 pending. TTE negative, for TEE?  To date, on Vancomycin.  ESRD: Back on TTS schedule now - next 12/6.  Hypertension/volume: BP variable, no edema. CXR=no acute abnormalities. Continue UF as tolerated. Under dry weight, lowered on d/c.   Anemia  - Hgb 11.1 - platelets 67 l,(will check outpatient records )no ESA needed.  Metabolic bone disease -  Phos elevated, continue calcium acetate with meals. May need to change to non calcium based binder. Last pth 57, hold  vit D due to elevated Calcium/suppressed PTH.was also on tums -> d/c these.  Nutrition -  renal diet w/fluid restrictions Right flank pain - s/p fall.  Right lateral rib pain no acute findings on imaging.  Elevated AST and ALT - unclear etiology, ?shock liver.  Improving values this a.m. / abd CT and u/s without acute abnormalities.   Ernest Haber, PA-C [facility] [phone] 05/12/2021,11:00 AM  LOS: 7 days   Labs: Basic Metabolic Panel: Recent Labs  Lab 05/06/21 0300 05/07/21 0217 05/10/21 0606 05/11/21 0049 05/12/21 0143  NA 139   < > 140 137 135  K 6.0*   < > 3.9 2.8* 5.3*  CL 95*   < > 102 100 101  CO2 22   < > 22 27 20*  GLUCOSE 84   < > 114* 160* 108*  BUN 107*   < > 48* 18 47*  CREATININE 16.65*   < > 7.87* 4.14* 6.70*  CALCIUM 9.4   < > 8.0* 7.7* 8.7*  PHOS 11.3*  --   --   --   --    < > = values in this interval not displayed.   Liver Function Tests: Recent Labs  Lab 05/10/21 0606 05/11/21 0049 05/12/21 0143  AST 62* 40 43*  ALT 664* 492* 368*  ALKPHOS 103 99 109  BILITOT 2.1* 2.0* 1.6*  PROT 5.6* 5.7* 6.3*  ALBUMIN 2.9*  2.9* 3.2*   No results for input(s): LIPASE, AMYLASE in the last 168 hours. Recent Labs  Lab 05/08/21 1020  AMMONIA 36*   CBC: Recent Labs  Lab 05/05/21 1534 05/06/21 0100 05/06/21 0300 05/08/21 1435 05/08/21 2048 05/10/21 1330  WBC 13.0* 11.7* 11.8* 7.8 7.2 7.4  NEUTROABS 10.5*  --   --   --   --   --   HGB 12.0* 10.7* 11.0* 12.7* 18.3* 11.1*  HCT 38.5* 34.1* 34.0* 39.8 58.6* 34.1*  MCV 101.3* 100.3* 98.0 96.8 98.5 97.2  PLT 102* 97* 94* 80* 67* 67*   Cardiac Enzymes: No results for input(s): CKTOTAL, CKMB, CKMBINDEX, TROPONINI in the last 168 hours. CBG: Recent Labs  Lab 05/06/21 0739 05/06/21 0804 05/06/21 0833 05/06/21 1822 05/07/21 1842  GLUCAP 58* 98 93 111* 85    Studies/Results: No results found. Medications:  vancomycin      calcium acetate  1,334 mg Oral TID WC    Chlorhexidine Gluconate Cloth  6 each Topical Q0600   famotidine  20 mg Oral Daily   heparin  5,000 Units Subcutaneous Q8H   lidocaine  1 patch Transdermal Q24H   metoprolol tartrate  25 mg Oral BID   sodium chloride flush  3 mL Intravenous Q12H

## 2021-05-13 ENCOUNTER — Inpatient Hospital Stay (HOSPITAL_COMMUNITY): Payer: Medicare Other

## 2021-05-13 ENCOUNTER — Encounter (HOSPITAL_COMMUNITY)
Admission: EM | Disposition: A | Discharge status: Home Health | Payer: Self-pay | Source: Home / Self Care | Attending: Internal Medicine

## 2021-05-13 ENCOUNTER — Inpatient Hospital Stay (HOSPITAL_COMMUNITY): Payer: Medicare Other | Admitting: Certified Registered"

## 2021-05-13 DIAGNOSIS — R7881 Bacteremia: Secondary | ICD-10-CM | POA: Diagnosis not present

## 2021-05-13 DIAGNOSIS — I340 Nonrheumatic mitral (valve) insufficiency: Secondary | ICD-10-CM | POA: Diagnosis not present

## 2021-05-13 DIAGNOSIS — B9562 Methicillin resistant Staphylococcus aureus infection as the cause of diseases classified elsewhere: Secondary | ICD-10-CM

## 2021-05-13 HISTORY — PX: TEE WITHOUT CARDIOVERSION: SHX5443

## 2021-05-13 LAB — COMPREHENSIVE METABOLIC PANEL
ALT: 277 U/L — ABNORMAL HIGH (ref 0–44)
AST: 36 U/L (ref 15–41)
Albumin: 3.3 g/dL — ABNORMAL LOW (ref 3.5–5.0)
Alkaline Phosphatase: 97 U/L (ref 38–126)
Anion gap: 20 — ABNORMAL HIGH (ref 5–15)
BUN: 77 mg/dL — ABNORMAL HIGH (ref 8–23)
CO2: 17 mmol/L — ABNORMAL LOW (ref 22–32)
Calcium: 9.2 mg/dL (ref 8.9–10.3)
Chloride: 99 mmol/L (ref 98–111)
Creatinine, Ser: 9.27 mg/dL — ABNORMAL HIGH (ref 0.61–1.24)
GFR, Estimated: 6 mL/min — ABNORMAL LOW (ref >60)
Glucose, Bld: 90 mg/dL (ref 70–99)
Potassium: 5.2 mmol/L — ABNORMAL HIGH (ref 3.5–5.1)
Sodium: 136 mmol/L (ref 135–145)
Total Bilirubin: 1.9 mg/dL — ABNORMAL HIGH (ref 0.3–1.2)
Total Protein: 6.2 g/dL — ABNORMAL LOW (ref 6.5–8.1)

## 2021-05-13 LAB — CBC
HCT: 36.7 % — ABNORMAL LOW (ref 39.0–52.0)
Hemoglobin: 12.1 g/dL — ABNORMAL LOW (ref 13.0–17.0)
MCH: 31.4 pg (ref 26.0–34.0)
MCHC: 33 g/dL (ref 30.0–36.0)
MCV: 95.3 fL (ref 80.0–100.0)
Platelets: 72 10*3/uL — ABNORMAL LOW (ref 150–400)
RBC: 3.85 MIL/uL — ABNORMAL LOW (ref 4.22–5.81)
RDW: 16.5 % — ABNORMAL HIGH (ref 11.5–15.5)
WBC: 5.4 10*3/uL (ref 4.0–10.5)
nRBC: 0 % (ref 0.0–0.2)

## 2021-05-13 LAB — POCT I-STAT, CHEM 8
BUN: 22 mg/dL (ref 8–23)
Calcium, Ion: 0.98 mmol/L — ABNORMAL LOW (ref 1.15–1.40)
Chloride: 93 mmol/L — ABNORMAL LOW (ref 98–111)
Creatinine, Ser: 3.5 mg/dL — ABNORMAL HIGH (ref 0.61–1.24)
Glucose, Bld: 74 mg/dL (ref 70–99)
HCT: 49 % (ref 39.0–52.0)
Hemoglobin: 16.7 g/dL (ref 13.0–17.0)
Potassium: 3.1 mmol/L — ABNORMAL LOW (ref 3.5–5.1)
Sodium: 137 mmol/L (ref 135–145)
TCO2: 29 mmol/L (ref 22–32)

## 2021-05-13 LAB — GLUCOSE, CAPILLARY: Glucose-Capillary: 93 mg/dL (ref 70–99)

## 2021-05-13 LAB — CULTURE, BLOOD (ROUTINE X 2): Culture: NO GROWTH

## 2021-05-13 SURGERY — ECHOCARDIOGRAM, TRANSESOPHAGEAL
Anesthesia: Monitor Anesthesia Care

## 2021-05-13 MED ORDER — SODIUM CHLORIDE 0.9 % IV SOLN: INTRAVENOUS | Status: DC

## 2021-05-13 MED ORDER — PROPOFOL 500 MG/50ML IV EMUL
INTRAVENOUS | Status: DC | PRN
  Administered 2021-05-13: 120 ug/kg/min via INTRAVENOUS

## 2021-05-13 MED ORDER — VANCOMYCIN HCL IN DEXTROSE 750-5 MG/150ML-% IV SOLN: 750.0000 mg | INTRAVENOUS | Status: AC

## 2021-05-13 NOTE — CV Procedure (Signed)
     TRANSESOPHAGEAL ECHOCARDIOGRAM   NAME:  Albert Harris   MRN: [id number] DOB:  [birth year]   ADMIT DATE: 05/05/2021  INDICATIONS: Bacteremia  PROCEDURE:   Informed consent was obtained prior to the procedure. The risks, benefits and alternatives for the procedure were discussed and the patient comprehended these risks.  Risks include, but are not limited to, cough, sore throat, vomiting, nausea, somnolence, esophageal and stomach trauma or perforation, bleeding, low blood pressure, aspiration, pneumonia, infection, trauma to the teeth and death.    After a procedural time-out, the oropharynx was anesthetized and the patient was sedated by the anesthesia service. The transesophageal probe was inserted in the esophagus and stomach without difficulty and multiple views were obtained. Anesthesia was monitored by Barrett Henle, CRNA and Dr Ermalene Postin.    COMPLICATIONS:    There were no immediate complications.  FINDINGS:  No vegetation seen  [name] MD [facility]  [address] [place], [place] [address] [phone]   2:25 PM

## 2021-05-13 NOTE — Transfer of Care (Signed)
Immediate Anesthesia Transfer of Care Note  Patient: Albert Harris  Procedure(s) Performed: TRANSESOPHAGEAL ECHOCARDIOGRAM (TEE)  Patient Location: Endoscopy Unit  Anesthesia Type:MAC  Level of Consciousness: sedated, patient cooperative and responds to stimulation  Airway & Oxygen Therapy: Patient Spontanous Breathing and Patient connected to nasal cannula oxygen  Post-op Assessment: Report given to RN, Post -op Vital signs reviewed and stable and Patient moving all extremities  Post vital signs: Reviewed and stable  Last Vitals:  Vitals Value Taken Time  BP    Temp    Pulse    Resp    SpO2      Last Pain:  Vitals:   05/13/21 1325  TempSrc: Temporal  PainSc: 0-No pain      Patients Stated Pain Goal: 0 (00/86/76 1950)  Complications: No notable events documented.

## 2021-05-13 NOTE — Progress Notes (Signed)
   05/13/21 1639  AVS Discharge Documentation  AVS Discharge Instructions Including Medications Provided to patient/caregiver  Name of Person Receiving AVS Discharge Instructions Including Medications Albert Harris and Albert Harris  Name of Clinician That Reviewed AVS Discharge Instructions Including Medications Venida Jarvis, RN

## 2021-05-13 NOTE — Discharge Instructions (Signed)
Follow with Primary MD Kerin Perna, NP in 7 days   Get CBC, CMP,  checked  by Primary MD next visit.    Activity: As tolerated with Full fall precautions use walker/cane & assistance as needed   Disposition Home    Diet: Carb Modified   On your next visit with your primary care physician please Get Medicines reviewed and adjusted.   Please request your Prim.MD to go over all Hospital Tests and Procedure/Radiological results at the follow up, please get all Hospital records sent to your Prim MD by signing hospital release before you go home.   If you experience worsening of your admission symptoms, develop shortness of breath, life threatening emergency, suicidal or homicidal thoughts you must seek medical attention immediately by calling 911 or calling your MD immediately  if symptoms less severe.  You Must read complete instructions/literature along with all the possible adverse reactions/side effects for all the Medicines you take and that have been prescribed to you. Take any new Medicines after you have completely understood and accpet all the possible adverse reactions/side effects.   Do not drive, operating heavy machinery, perform activities at heights, swimming or participation in water activities or provide baby sitting services if your were admitted for syncope or siezures until you have seen by Primary MD or a Neurologist and advised to do so again.  Do not drive when taking Pain medications.    Do not take more than prescribed Pain, Sleep and Anxiety Medications  Special Instructions: If you have smoked or chewed Tobacco  in the last 2 yrs please stop smoking, stop any regular Alcohol  and or any Recreational drug use.  Wear Seat belts while driving.   Please note  You were cared for by a hospitalist during your hospital stay. If you have any questions about your discharge medications or the care you received while you were in the hospital after you are  discharged, you can call the unit and asked to speak with the hospitalist on call if the hospitalist that took care of you is not available. Once you are discharged, your primary care physician will handle any further medical issues. Please note that NO REFILLS for any discharge medications will be authorized once you are discharged, as it is imperative that you return to your primary care physician (or establish a relationship with a primary care physician if you do not have one) for your aftercare needs so that they can reassess your need for medications and monitor your lab values.

## 2021-05-13 NOTE — Anesthesia Preprocedure Evaluation (Signed)
Anesthesia Evaluation  Patient identified by MRN, date of birth, ID band Patient awake    Reviewed: Allergy & Precautions, NPO status , Patient's Chart, lab work & pertinent test results, reviewed documented beta blocker date and time   History of Anesthesia Complications Negative for: history of anesthetic complications  Airway Mallampati: II  TM Distance: >3 FB Neck ROM: Full    Dental  (+) Poor Dentition, Missing, Dental Advisory Given,    Pulmonary asthma , COPD,  COPD inhaler, former smoker,  COVID 06/16/20  Diffuse faint inspiratory/expiratory wheezing   + wheezing      Cardiovascular hypertension, Pt. on home beta blockers and Pt. on medications + Peripheral Vascular Disease and +CHF   Rhythm:Regular  1. Left ventricular ejection fraction, by estimation, is 40 to 45%. The  left ventricle has mildly decreased function. The left ventricle  demonstrates global hypokinesis. There is moderate left ventricular  hypertrophy. Left ventricular diastolic  parameters are consistent with Grade II diastolic dysfunction  (pseudonormalization). Elevated left atrial pressure.  2. Right ventricular systolic function is mildly reduced. The right  ventricular size is mildly enlarged. There is moderately elevated  pulmonary artery systolic pressure. The estimated right ventricular  systolic pressure is 63.8 mmHg.  3. Left atrial size was severely dilated.  4. Right atrial size was severely dilated.  5. The mitral valve is normal in structure. Mild mitral valve  regurgitation. No evidence of mitral stenosis.  6. The aortic valve was not well visualized. Aortic valve regurgitation  is not visualized. Aortic valve sclerosis/calcification is present,  without any evidence of aortic stenosis.  7. The inferior vena cava is dilated in size with <50% respiratory  variability, suggesting right atrial pressure of 15 mmHg.     Neuro/Psych TIACVA    GI/Hepatic Neg liver ROS, GERD  ,  Endo/Other  negative endocrine ROS  Renal/GU ESRF and DialysisRenal disease (COMPLICATION OF AVF)HD today     Musculoskeletal negative musculoskeletal ROS (+)   Abdominal   Peds  Hematology  (+) Blood dyscrasia, anemia ,   Anesthesia Other Findings Day of surgery medications reviewed with the patient.  Reproductive/Obstetrics                             Anesthesia Physical Anesthesia Plan  ASA: 3  Anesthesia Plan: MAC   Post-op Pain Management: Minimal or no pain anticipated   Induction:   PONV Risk Score and Plan: 1 and Treatment may vary due to age or medical condition and Propofol infusion  Airway Management Planned: Nasal Cannula  Additional Equipment:   Intra-op Plan:   Post-operative Plan:   Informed Consent: I have reviewed the patients History and Physical, chart, labs and discussed the procedure including the risks, benefits and alternatives for the proposed anesthesia with the patient or authorized representative who has indicated his/her understanding and acceptance.     Dental advisory given  Plan Discussed with: CRNA and Anesthesiologist  Anesthesia Plan Comments:         Anesthesia Quick Evaluation

## 2021-05-13 NOTE — Procedures (Signed)
I was present at this dialysis session. I have reviewed the session itself and made appropriate changes.   No c/o. 2L UF using 2K bath.  RFA AVG.    TEE pending today.    Filed Weights   05/10/21 1745 05/12/21 0507 05/13/21 0802  Weight: 59 kg 61.1 kg 61 kg    Recent Labs  Lab 05/13/21 0640  NA 136  K 5.2*  CL 99  CO2 17*  GLUCOSE 90  BUN 77*  CREATININE 9.27*  CALCIUM 9.2    Recent Labs  Lab 05/08/21 1435 05/08/21 2048 05/10/21 1330  WBC 7.8 7.2 7.4  HGB 12.7* 18.3* 11.1*  HCT 39.8 58.6* 34.1*  MCV 96.8 98.5 97.2  PLT 80* 67* 67*    Scheduled Meds:  calcium acetate  1,334 mg Oral TID WC   Chlorhexidine Gluconate Cloth  6 each Topical Q0600   famotidine  20 mg Oral Daily   heparin  5,000 Units Subcutaneous Q8H   lidocaine  1 patch Transdermal Q24H   metoprolol tartrate  25 mg Oral BID   sodium chloride flush  3 mL Intravenous Q12H   Continuous Infusions:  vancomycin     PRN Meds:.albuterol, dextrose, diphenhydrAMINE, haloperidol **OR** haloperidol lactate, labetalol, ondansetron **OR** ondansetron (ZOFRAN) IV, senna-docusate   Pearson Grippe  MD 05/13/2021, 9:00 AM

## 2021-05-13 NOTE — Plan of Care (Signed)
  Problem: Education: Goal: Knowledge of General Education information will improve Description: Including pain rating scale, medication(s)/side effects and non-pharmacologic comfort measures Outcome: Progressing   Problem: Health Behavior/Discharge Planning: Goal: Ability to manage health-related needs will improve Outcome: Progressing   Problem: Clinical Measurements: Goal: Ability to maintain clinical measurements within normal limits will improve Outcome: Progressing Goal: Will remain free from infection Outcome: Progressing Goal: Diagnostic test results will improve Outcome: Progressing Goal: Respiratory complications will improve Outcome: Progressing Goal: Cardiovascular complication will be avoided Outcome: Progressing   Problem: Activity: Goal: Risk for activity intolerance will decrease Outcome: Progressing   Problem: Nutrition: Goal: Adequate nutrition will be maintained Outcome: Progressing   Problem: Coping: Goal: Level of anxiety will decrease Outcome: Progressing   Problem: Elimination: Goal: Will not experience complications related to bowel motility Outcome: Progressing Goal: Will not experience complications related to urinary retention Outcome: Progressing   Problem: Pain Managment: Goal: General experience of comfort will improve Outcome: Progressing   Problem: Safety: Goal: Ability to remain free from injury will improve Outcome: Progressing   Problem: Skin Integrity: Goal: Risk for impaired skin integrity will decrease Outcome: Progressing   Problem: Education: Goal: Ability to demonstrate management of disease process will improve Outcome: Progressing Goal: Ability to verbalize understanding of medication therapies will improve Outcome: Progressing Goal: Individualized Educational Video(s) Outcome: Progressing   Problem: Fluid Volume: Goal: Compliance with measures to maintain balanced fluid volume will improve Outcome: Progressing    Problem: Health Behavior/Discharge Planning: Goal: Ability to manage health-related needs will improve Outcome: Progressing   Problem: Education: Goal: Knowledge of disease and its progression will improve Outcome: Progressing Goal: Individualized Educational Video(s) Outcome: Progressing

## 2021-05-13 NOTE — Discharge Summary (Signed)
Physician Discharge Summary  DESMEN SCHOFFSTALL VEL:[id number] DOB: 04/07/1956 DOA: 05/05/2021  PCP: Kerin Perna, NP  Admit date: 05/05/2021 Discharge date: 05/13/2021  Admitted From: Home Disposition:  Home   Recommendations for Outpatient Follow-up:  Follow up with PCP in 1-2 weeks Please obtain CMP/CBC in one week Please f continue with IV vancomycin post hemodialysis on TTS schedule until 06/08/2021.  Home Health:YES   Discharge Condition:Stable CODE STATUS:FULL Diet recommendation: Heart Healthy   Brief/Interim Summary:  65 year old gentleman with history of ESRD on hemodialysis, COPD, peripheral vascular disease, hypertension, hyperlipidemia and severe noncompliance to dialysis regimen presented with fall at home.  Not going to hemodialysis since last 1 week.  Not eating well since Thanksgiving as per family.  In the emergency room hemodynamically stable.  Potassium 7. AST and ALT 2428/2294 with bilirubin of 2.  Urgently hemodialyzed.  Skeletal survey negative.   Patient has clinically improved and prepared to discharge after hemodialysis on 11/30.  He became very confused and combative so discharge was canceled.  Patient remained encephalopathic overnight with no obvious known cause. 12/2, most of the delirium improved now.   -Blood culture growing MRSA.      Acute metabolic encephalopathy: Developed in the hospital.  Primary cause unknown. -No drugs or alcohol -CT head was normal.  Subsequent MRI with a small subdural possibly happened after a fall in the bathroom but no other complications. -Ammonia, salicylate, Tylenol levels normal.  Blood gases essentially normal. -LFTs are already improving and downtrending. -Patient has improved, back to baseline   MRSA bacteremia -Blood culture on admission growing MRSA, repeat cultures on 12/3, remains negative at time of discharge, he was treated empirically with IV vancomycin during hospital stay, TEE was done with no  evidence of vegetation . -ID input greatly appreciated, patient will need to finish total of 4 weeks of IV vancomycin, can be done after hemodialysis on TTS schedule, started 05/09/2021, to continue until 06/08/2021.   ESRD on hemodialysis, missed hemodialysis resulting in hyperkalemia: Treated with temporizing measures with calcium gluconate, insulin and dextrose infusion as well bicarbonate. Patient underwent consecutive dialysis sessions with improvement.  Now he is back on TTS schedule,  he was dialyzed today   Hyperkalemia/hypokalemia -management with HD   Elevated LFTs:  -Likely due to shock liver from hypotension, CT abdomen pelvis with no acute findings, no acute finding on right upper quadrant ultrasound, -trending down   Hypoglycemia: Due to poor oral intake and insulin use per correction of hyperkalemia.  Regular diet.  Blood sugars stabilized after eating regular diet.  COPD: Stable.  Albuterol as needed.   Fall: Patient has right lateral rib pain and tenderness that is reproducible.  Skeletal survey negative.  Local lidocaine patch. -Patient with very small some subdural tentorial bleed, discussed with neurosurgery Dr. Kathyrn Sheriff, patient is asymptomatic, no contraindication for subcu heparin for DVT prophylaxis.       Discharge Diagnoses:  Principal Problem:   Hyperkalemia Active Problems:   ESRD (end stage renal disease) (HCC)   Hypertension   COPD (chronic obstructive pulmonary disease) (HCC)   Elevated LFTs   Hypoglycemia   Thrombocytopenia (HCC)   Fall at home, initial encounter   Bacteremia due to Gram-positive bacteria   Acute metabolic encephalopathy    Discharge Instructions  Discharge Instructions     Diet - low sodium heart healthy   Complete by: As directed    Diet - low sodium heart healthy   Complete by: As directed    Discharge  instructions   Complete by: As directed    Go to your dialysis tomorrow morning as scheduled   Discharge  instructions   Complete by: As directed    Follow with Primary MD Kerin Perna, NP in 7 days   Get CBC, CMP,  checked  by Primary MD next visit.    Activity: As tolerated with Full fall precautions use walker/cane & assistance as needed   Disposition Home    Diet: Carb Modified   On your next visit with your primary care physician please Get Medicines reviewed and adjusted.   Please request your Prim.MD to go over all Hospital Tests and Procedure/Radiological results at the follow up, please get all Hospital records sent to your Prim MD by signing hospital release before you go home.   If you experience worsening of your admission symptoms, develop shortness of breath, life threatening emergency, suicidal or homicidal thoughts you must seek medical attention immediately by calling 911 or calling your MD immediately  if symptoms less severe.  You Must read complete instructions/literature along with all the possible adverse reactions/side effects for all the Medicines you take and that have been prescribed to you. Take any new Medicines after you have completely understood and accpet all the possible adverse reactions/side effects.   Do not drive, operating heavy machinery, perform activities at heights, swimming or participation in water activities or provide baby sitting services if your were admitted for syncope or siezures until you have seen by Primary MD or a Neurologist and advised to do so again.  Do not drive when taking Pain medications.    Do not take more than prescribed Pain, Sleep and Anxiety Medications  Special Instructions: If you have smoked or chewed Tobacco  in the last 2 yrs please stop smoking, stop any regular Alcohol  and or any Recreational drug use.  Wear Seat belts while driving.   Please note  You were cared for by a hospitalist during your hospital stay. If you have any questions about your discharge medications or the care you received while  you were in the hospital after you are discharged, you can call the unit and asked to speak with the hospitalist on call if the hospitalist that took care of you is not available. Once you are discharged, your primary care physician will handle any further medical issues. Please note that NO REFILLS for any discharge medications will be authorized once you are discharged, as it is imperative that you return to your primary care physician (or establish a relationship with a primary care physician if you do not have one) for your aftercare needs so that they can reassess your need for medications and monitor your lab values.   Increase activity slowly   Complete by: As directed    Increase activity slowly   Complete by: As directed    Increase activity slowly   Complete by: As directed    No wound care   Complete by: As directed       Allergies as of 05/13/2021   No Known Allergies      Medication List     STOP taking these medications    ASPIRIN LOW DOSE 81 MG EC tablet Generic drug: aspirin   calcitRIOL 0.5 MCG capsule Commonly known as: ROCALTROL   furosemide 40 MG tablet Commonly known as: LASIX   hydrALAZINE 50 MG tablet Commonly known as: APRESOLINE   hydrOXYzine 50 MG tablet Commonly known as: ATARAX   losartan 50  MG tablet Commonly known as: COZAAR   ondansetron 4 MG disintegrating tablet Commonly known as: Zofran ODT   oxyCODONE-acetaminophen 7.5-325 MG tablet Commonly known as: Percocet   sodium bicarbonate 650 MG tablet       TAKE these medications    albuterol 108 (90 Base) MCG/ACT inhaler Commonly known as: VENTOLIN HFA INHALE 1-2 PUFFS INTO THE LUNGS EVERY 6 (SIX) HOURS AS NEEDED FOR WHEEZING OR SHORTNESS OF BREATH.   amLODipine 10 MG tablet Commonly known as: NORVASC Take 1 tablet (10 mg total) by mouth in the morning.   calcium acetate 667 MG capsule Commonly known as: PHOSLO Take 2 capsule by mouth three times a day with meals What  changed: Another medication with the same name was removed. Continue taking this medication, and follow the directions you see here.   DIALYVITE 800 WITH ZINC 0.8 MG Tabs Take 1 tablet by mouth at bedtime.   famotidine 20 MG tablet Commonly known as: PEPCID Take 1 tablet (20 mg total) by mouth 2 (two) times daily.   lidocaine 5 % Commonly known as: LIDODERM Place 1 patch onto the skin daily. Remove & Discard patch within 12 hours or as directed by MD   lidocaine-prilocaine cream Commonly known as: EMLA Apply 1 application topically See admin instructions. Apply a small amount to access site 1-2 hours before dialysis,cover with occlusive dressing(saran wrap)   metoprolol tartrate 25 MG tablet Commonly known as: LOPRESSOR TAKE 1 TABLET BY MOUTH TWICE A DAY   nitroGLYCERIN 0.4 MG SL tablet Commonly known as: NITROSTAT Place 1 tablet (0.4 mg total) under the tongue every 5 (five) minutes x 3 doses as needed for chest pain.   Vancomycin 750-5 MG/150ML-% Soln Commonly known as: VANCOCIN Inject 150 mLs (750 mg total) into the vein Every Tuesday,Thursday,and Saturday with dialysis for 24 days. Start taking on: May 15, 2021        Follow-up Information     Kerin Perna, NP Follow up.   Specialty: Internal Medicine Contact information: [place] [address] [phone]         Sanda Klein, MD .   Specialty: Cardiology Contact information: [address] [place] [address] [place], [place] Follow up.   Specialty: Home Health Services Why: HH-PT arranged. They will contact you to scheudle appointment Contact information: [place]  [id number] [phone]                No Known Allergies  Consultations: Renal  ID CARDS fot TEE   Procedures/Studies: DG Chest 2 View  Result Date: 05/05/2021 CLINICAL DATA:  Weakness, fell last night  EXAM: CHEST - 2 VIEW COMPARISON:  12/14/2020 FINDINGS: Enlargement of cardiac silhouette. Atherosclerotic calcification aorta. Slightly prominent pulmonary vascular structures at the hila unchanged. Lungs clear. No pulmonary infiltrate, pleural effusion, or pneumothorax. Diffuse osseous demineralization. No definite fractures identified. IMPRESSION: Enlargement of cardiac silhouette. No acute abnormalities. Electronically Signed   By: Lavonia Dana M.D.   On: 05/05/2021 16:34   DG Thoracic Spine 2 View  Result Date: 05/05/2021 CLINICAL DATA:  Back pain post fall last night EXAM: THORACIC SPINE 2 VIEWS COMPARISON:  None FINDINGS: Vertebral body and disc space heights maintained. No fracture, subluxation, or bone destruction. Atherosclerotic calcifications aorta. IMPRESSION: No acute osseous abnormalities. Aortic Atherosclerosis (ICD10-I70.0). Electronically Signed   By: Lavonia Dana M.D.   On: 05/05/2021 16:35  CT HEAD WO CONTRAST (5MM)  Result Date: 05/08/2021 CLINICAL DATA:  65 year old male status post fall. End stage renal disease. EXAM: CT HEAD WITHOUT CONTRAST TECHNIQUE: Contiguous axial images were obtained from the base of the skull through the vertex without intravenous contrast. COMPARISON:  Head CT 05/05/2021.  Brain MRI 11/10/2017. FINDINGS: Brain: Chronic lacunar infarct anterior right external capsule. Stable cerebral volume. No midline shift, ventriculomegaly, mass effect, evidence of mass lesion, intracranial hemorrhage or evidence of cortically based acute infarction. Stable gray-white matter differentiation throughout the brain. Vascular: Extensive Calcified atherosclerosis at the skull base. Resolved gas previously seen within the superior sagittal sinus, and to a lesser extent cavernous sinus and facial veins. Calcified MCA atherosclerosis is apparent. No suspicious intracranial vascular hyperdensity. Skull: Stable, intact. Sinuses/Orbits: Visualized paranasal sinuses and mastoids are  clear. Other: No orbit or scalp soft tissue injury identified. IMPRESSION: 1. No acute intracranial abnormality or recent traumatic injury identified. 2. Extensive 6intracranial atherosclerosis. Chronic right external capsule lacunar infarct. 3. Resolved intravenous gas, likely was IV access related. Electronically Signed   By: Genevie Ann M.D.   On: 05/08/2021 08:06   CT Head Wo Contrast  Result Date: 05/05/2021 CLINICAL DATA:  Trauma. EXAM: CT HEAD WITHOUT CONTRAST CT CERVICAL SPINE WITHOUT CONTRAST TECHNIQUE: Multidetector CT imaging of the head and cervical spine was performed following the standard protocol without intravenous contrast. Multiplanar CT image reconstructions of the cervical spine were also generated. COMPARISON:  None. FINDINGS: CT HEAD FINDINGS Brain: The ventricles and sulci appropriate size for patient's age. 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 There is no acute intracranial hemorrhage. No mass effect or midline shift no extra-axial fluid collection. Vascular: No hyperdense vessel or unexpected calcification. Skull: Normal. Negative for fracture or focal lesion. Sinuses/Orbits: No acute finding. Other: There is air within the superior sagittal sinus, likely iatrogenic and introduced via IV access. CT CERVICAL SPINE FINDINGS Alignment: No acute subluxation. There is straightening of normal cervical lordosis which may be positional or due to muscle spasm. Skull base and vertebrae: No acute fracture. Soft tissues and spinal canal: No prevertebral fluid or swelling. No visible canal hematoma. Disc levels: Multilevel degenerative changes with disc space narrowing and endplate irregularity and spurring. Upper chest: Negative. Other: Bilateral carotid bulb calcified plaques. IMPRESSION: 1. No acute intracranial pathology. 2. Moderate air within the superior sagittal sinus, likely iatrogenic and introduced via IV access. 3. No  acute/traumatic cervical spine pathology. Multilevel degenerative changes. These results were called by telephone at the time of interpretation on 05/05/2021 at 6:51 pm to Dr. Aletta Edouard, who verbally acknowledged these results. Electronically Signed   By: Anner Crete M.D.   On: 05/05/2021 18:53   CT Cervical Spine Wo Contrast  Result Date: 05/05/2021 CLINICAL DATA:  Trauma. EXAM: CT HEAD WITHOUT CONTRAST CT CERVICAL SPINE WITHOUT CONTRAST TECHNIQUE: Multidetector CT imaging of the head and cervical spine was performed following the standard protocol without intravenous contrast. Multiplanar CT image reconstructions of the cervical spine were also generated. COMPARISON:  None. FINDINGS: CT HEAD FINDINGS Brain: The ventricles and sulci appropriate size for patient's age. 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 There is no acute intracranial hemorrhage. No mass effect or midline shift no extra-axial fluid collection. Vascular: No hyperdense vessel or unexpected calcification. Skull: Normal. Negative for fracture or focal lesion. Sinuses/Orbits: No acute finding. Other: There is air  within the superior sagittal sinus, likely iatrogenic and introduced via IV access. CT CERVICAL SPINE FINDINGS Alignment: No acute subluxation. There is straightening of normal cervical lordosis which may be positional or due to muscle spasm. Skull base and vertebrae: No acute fracture. Soft tissues and spinal canal: No prevertebral fluid or swelling. No visible canal hematoma. Disc levels: Multilevel degenerative changes with disc space narrowing and endplate irregularity and spurring. Upper chest: Negative. Other: Bilateral carotid bulb calcified plaques. IMPRESSION: 1. No acute intracranial pathology. 2. Moderate air within the superior sagittal sinus, likely iatrogenic and introduced via IV access. 3. No acute/traumatic cervical spine pathology. Multilevel  degenerative changes. These results were called by telephone at the time of interpretation on 05/05/2021 at 6:51 pm to Dr. Aletta Edouard, who verbally acknowledged these results. Electronically Signed   By: Anner Crete M.D.   On: 05/05/2021 18:53   MR BRAIN WO CONTRAST  Addendum Date: 05/09/2021   ADDENDUM REPORT: 05/09/2021 12:04 ADDENDUM: Impression #2 called by telephone at the time of interpretation on 05/09/2021 at 12:03 pm to provider [facility] , who verbally acknowledged these results. Electronically Signed   By: Kellie Simmering D.O.   On: 05/09/2021 12:04   Result Date: 05/09/2021 CLINICAL DATA:  Mental status change, persistent or worsening; sudden onset delirium. EXAM: MRI HEAD WITHOUT CONTRAST TECHNIQUE: Multiplanar, multiecho pulse sequences of the brain and surrounding structures were obtained without intravenous contrast. COMPARISON:  Prior head CT examinations 05/08/2021 and earlier. MRI brain and MRA head 11/10/2017. FINDINGS: Brain: Intermittently motion degraded examination, limiting evaluation. Most notably, there is mild-to-moderate motion degradation of the axial T2 TSE sequence, moderate/severe motion degradation of the axial T2 FLAIR sequence and moderate motion degradation of the coronal T2 TSE sequence. Mild-to-moderate generalized cerebral atrophy. Comparatively mild cerebellar atrophy. Redemonstrated chronic small-vessel infarct within the right basal ganglia/external capsule. Background mild multifocal T2 FLAIR hyperintense signal abnormality within the cerebral white matter, nonspecific but compatible with chronic small vessel ischemic disease. Chronic microhemorrhages within the right frontal, parietal and temporal lobes, new from the prior MRI (3 total). Asymmetric T2* signal loss along the left tentorium, suspicious for a trace subdural hematoma. In retrospect, there was trace asymmetric hyperdensity along the left tentorium on the head CT of 05/08/2021. There is no acute  infarct. No evidence of an intracranial mass. No extra-axial fluid collection. No midline shift. Vascular: Signal abnormality within portions of the V4 right vertebral artery suggesting high-grade stenosis or vessel occlusion. Flow voids otherwise maintained within the proximal large arterial vessels. Skull and upper cervical spine: No focal suspicious marrow lesion. Diffuse osseous heterogeneity, likely related to end-stage renal disease. Sinuses/Orbits: Visualized orbits show no acute finding. Minimal scattered paranasal sinus mucosal thickening at the imaged levels. Other: Trace fluid within the left mastoid air cells. IMPRESSION: Intermittently motion degraded examination, as described. Suspected trace acute subdural hematoma layering along the left tentorium. No evidence of acute infarction. Redemonstrated chronic small-vessel infarct within the right basal ganglia/external capsule. Background mild chronic small vessel ischemic changes within the cerebral white matter, progressed from the brain MRI of 11/10/2017. Mild-to-moderate generalized cerebral atrophy. Comparatively mild cerebellar atrophy. Signal abnormality within the intracranial right vertebral artery, suggesting high-grade stenosis or vessel occlusion. This is a new finding as compared to the MRI/MRA of 11/10/2017. Trace left mastoid effusion. Electronically Signed: By: Kellie Simmering D.O. On: 05/09/2021 11:56   CT ABDOMEN PELVIS W CONTRAST  Result Date: 05/05/2021 CLINICAL DATA:  Abdominal trauma.  Fall EXAM: CT ABDOMEN AND PELVIS  WITH CONTRAST TECHNIQUE: Multidetector CT imaging of the abdomen and pelvis was performed using the standard protocol following bolus administration of intravenous contrast. CONTRAST:  18mL OMNIPAQUE IOHEXOL 350 MG/ML SOLN COMPARISON:  None. FINDINGS: Lower chest: Lung bases are clear. Hepatobiliary: No focal hepatic lesion. No biliary duct dilatation. Common bile duct is normal. Pancreas: Pancreas is normal. No ductal  dilatation. No pancreatic inflammation. Spleen: Normal spleen Adrenals/urinary tract: Adrenal glands normal. Kidneys are atrophic. Ureters and bladder normal. Stomach/Bowel: Stomach, small bowel, appendix, and cecum are normal. Multiple diverticula of the descending colon and sigmoid colon without acute inflammation. Vascular/Lymphatic: Abdominal aorta is normal caliber with atherosclerotic calcification. There is no retroperitoneal or periportal lymphadenopathy. No pelvic lymphadenopathy. Reproductive: Unremarkable Other: Small volume of free fluid along the margin of the liver. Musculoskeletal: No aggressive osseous lesion.  No fracture. IMPRESSION: 1. No evidence of abdominal trauma. 2. Atrophic kidneys and small volume intraperitoneal free fluid. 3. LEFT colon diverticulosis. 4. Dense atherosclerotic disease of the aorta. Aortic Atherosclerosis (ICD10-I70.0). Electronically Signed   By: Suzy Bouchard M.D.   On: 05/05/2021 21:55   ECHOCARDIOGRAM COMPLETE  Result Date: 05/09/2021    ECHOCARDIOGRAM REPORT   Patient Name:   MISHAEL HARAN Date of Exam: 05/09/2021 Medical Rec #:  [id number]      Height:       68.0 in Accession #:    [id number]     Weight:       132.3 lb Date of Birth:  1956/02/01     BSA:          1.714 m Patient Age:    65 years       BP:           135/71 mmHg Patient Gender: M              HR:           62 bpm. Exam Location:  Inpatient Procedure: 2D Echo, Cardiac Doppler and Color Doppler Indications:    Bacteremia  History:        Patient has no prior history of Echocardiogram examinations and                 Patient has prior history of Echocardiogram examinations, most                 recent 07/18/2019. CHF; Risk Factors:Hypertension and Current                 Smoker.  Sonographer:    Merrie Roof RDCS Referring Phys: [id number] [place]  1. Left ventricular ejection fraction, by estimation, is 40 to 45%. The left ventricle has mildly decreased function. The left ventricle  demonstrates global hypokinesis. There is moderate left ventricular hypertrophy. Left ventricular diastolic parameters are consistent with Grade II diastolic dysfunction (pseudonormalization). Elevated left atrial pressure.  2. Right ventricular systolic function is mildly reduced. The right ventricular size is mildly enlarged. There is moderately elevated pulmonary artery systolic pressure. The estimated right ventricular systolic pressure is 93.7 mmHg.  3. Left atrial size was severely dilated.  4. Right atrial size was severely dilated.  5. The mitral valve is normal in structure. Mild mitral valve regurgitation. No evidence of mitral stenosis.  6. The aortic valve was not well visualized. Aortic valve regurgitation is not visualized. Aortic valve sclerosis/calcification is present, without any evidence of aortic stenosis.  7. The inferior vena cava is dilated in size with <50% respiratory variability, suggesting right atrial pressure of  15 mmHg. Conclusion(s)/Recommendation(s): No evidence of valvular vegetations on this transthoracic echocardiogram. Consider a transesophageal echocardiogram to exclude infective endocarditis if clinically indicated. FINDINGS  Left Ventricle: Left ventricular ejection fraction, by estimation, is 40 to 45%. The left ventricle has mildly decreased function. The left ventricle demonstrates global hypokinesis. The left ventricular internal cavity size was normal in size. There is  moderate left ventricular hypertrophy. Left ventricular diastolic parameters are consistent with Grade II diastolic dysfunction (pseudonormalization). Elevated left atrial pressure. Right Ventricle: The right ventricular size is mildly enlarged. Right vetricular wall thickness was not well visualized. Right ventricular systolic function is mildly reduced. There is moderately elevated pulmonary artery systolic pressure. The tricuspid  regurgitant velocity is 2.97 m/s, and with an assumed right atrial  pressure of 15 mmHg, the estimated right ventricular systolic pressure is 63.0 mmHg. Left Atrium: Left atrial size was severely dilated. Right Atrium: Right atrial size was severely dilated. Pericardium: There is no evidence of pericardial effusion. Mitral Valve: The mitral valve is normal in structure. Mild mitral valve regurgitation. No evidence of mitral valve stenosis. Tricuspid Valve: The tricuspid valve is normal in structure. Tricuspid valve regurgitation is trivial. Aortic Valve: The aortic valve was not well visualized. Aortic valve regurgitation is not visualized. Aortic valve sclerosis/calcification is present, without any evidence of aortic stenosis. Aortic valve mean gradient measures 6.0 mmHg. Aortic valve peak gradient measures 13.4 mmHg. Aortic valve area, by VTI measures 1.92 cm. Pulmonic Valve: The pulmonic valve was not well visualized. Pulmonic valve regurgitation is not visualized. Aorta: The aortic root and ascending aorta are structurally normal, with no evidence of dilitation. Venous: The inferior vena cava is dilated in size with less than 50% respiratory variability, suggesting right atrial pressure of 15 mmHg. IAS/Shunts: The interatrial septum was not well visualized.  LEFT VENTRICLE PLAX 2D LVIDd:         5.00 cm   Diastology LVIDs:         4.00 cm   LV e' medial:    3.08 cm/s LV PW:         1.40 cm   LV E/e' medial:  27.8 LV IVS:        1.40 cm   LV e' lateral:   6.23 cm/s LVOT diam:     2.10 cm   LV E/e' lateral: 13.8 LV SV:         66 LV SV Index:   38 LVOT Area:     3.46 cm  RIGHT VENTRICLE            IVC RV Basal diam:  4.60 cm    IVC diam: 2.10 cm RV Mid diam:    3.00 cm RV S prime:     8.33 cm/s TAPSE (M-mode): 1.6 cm LEFT ATRIUM              Index        RIGHT ATRIUM           Index LA diam:        3.90 cm  2.27 cm/m   RA Area:     26.60 cm LA Vol (A2C):   129.0 ml 75.24 ml/m  RA Volume:   96.00 ml  56.00 ml/m LA Vol (A4C):   74.6 ml  43.51 ml/m LA Biplane Vol: 98.0 ml   57.16 ml/m  AORTIC VALVE AV Area (Vmax):    2.33 cm AV Area (Vmean):   2.22 cm AV Area (VTI):     1.92 cm  AV Vmax:           183.00 cm/s AV Vmean:          111.000 cm/s AV VTI:            0.342 m AV Peak Grad:      13.4 mmHg AV Mean Grad:      6.0 mmHg LVOT Vmax:         123.00 cm/s LVOT Vmean:        71.300 cm/s LVOT VTI:          0.190 m LVOT/AV VTI ratio: 0.56  AORTA Ao Root diam: 3.10 cm Ao Asc diam:  3.00 cm MITRAL VALVE               TRICUSPID VALVE MV Area (PHT): 2.69 cm    TR Peak grad:   35.3 mmHg MV Decel Time: 282 msec    TR Vmax:        297.00 cm/s MV E velocity: 85.70 cm/s MV A velocity: 51.60 cm/s  SHUNTS MV E/A ratio:  1.66        Systemic VTI:  0.19 m                            Systemic Diam: 2.10 cm [name] MD Electronically signed by [name] MD Signature Date/Time: 05/09/2021/5:51:14 PM    Final    ECHO TEE  Result Date: 05/13/2021    TRANSESOPHOGEAL ECHO REPORT   Patient Name:   YECHEZKEL FERTIG Date of Exam: 05/13/2021 Medical Rec #:  [id number]      Height:       68.0 in Accession #:    [id number]     Weight:       134.5 lb Date of Birth:  1956-05-30     BSA:          1.727 m Patient Age:    10 years       BP:           100/42 mmHg Patient Gender: M              HR:           68 bpm. Exam Location:  Inpatient Procedure: Transesophageal Echo, Cardiac Doppler and Color Doppler Indications:     Bacteremia. ESRD.  History:         Patient has prior history of Echocardiogram examinations, most                  recent 05/09/2021.  Sonographer:     Philipp Deputy RDCS Referring Phys:  [id number] Leanor Kail Diagnosing Phys: [name] MD PROCEDURE: After discussion of the risks and benefits of a TEE, an informed consent was obtained from the patient. The transesophogeal probe was passed without difficulty through the esophogus of the patient. Imaged were obtained with the patient in a left lateral decubitus position. Sedation performed by different  physician. The patient was monitored while under deep sedation. Anesthestetic sedation was provided intravenously by Anesthesiology: 127.44mg  of Propofol. Image quality was adequate. The patient developed no complications during the procedure. IMPRESSIONS  1. Left ventricular ejection fraction, by estimation, is 35 to 40%. The left ventricle has moderately decreased function. There is severe left ventricular hypertrophy.  2. Right ventricular systolic function is mildly reduced. The right ventricular size is mildly enlarged.  3. Left atrial size was severely dilated. No left atrial/left atrial appendage thrombus was detected.  4. Right atrial size was severely dilated.  5. The mitral valve is normal in structure. Mild mitral valve regurgitation.  6. The aortic valve is tricuspid. Aortic valve regurgitation is not visualized. No aortic stenosis is present.  7. No vegetation seen FINDINGS  Left Ventricle: Left ventricular ejection fraction, by estimation, is 35 to 40%. The left ventricle has moderately decreased function. The left ventricular internal cavity size was normal in size. There is severe left ventricular hypertrophy. Right Ventricle: The right ventricular size is mildly enlarged. Right vetricular wall thickness was not well visualized. Right ventricular systolic function is mildly reduced. Left Atrium: Left atrial size was severely dilated. No left atrial/left atrial appendage thrombus was detected. Right Atrium: Right atrial size was severely dilated. Pericardium: There is no evidence of pericardial effusion. Mitral Valve: The mitral valve is normal in structure. Mild mitral valve regurgitation. Tricuspid Valve: The tricuspid valve is normal in structure. Tricuspid valve regurgitation is mild. Aortic Valve: The aortic valve is tricuspid. Aortic valve regurgitation is not visualized. No aortic stenosis is present. Pulmonic Valve: The pulmonic valve was grossly normal. Pulmonic valve regurgitation is not  visualized. Aorta: The aortic root and ascending aorta are structurally normal, with no evidence of dilitation. IAS/Shunts: No atrial level shunt detected by color flow Doppler. [name] MD Electronically signed by [name] MD Signature Date/Time: 05/13/2021/3:07:43 PM    Final    US Abdomen Limited RUQ (LIVER/GB)  Result Date: 05/06/2021 CLINICAL DATA:  Right upper quadrant pain.  Recent fall. EXAM: ULTRASOUND ABDOMEN LIMITED RIGHT UPPER QUADRANT COMPARISON:  CT with IV contrast 05/05/2021, 12/14/2020. FINDINGS: Gallbladder: No gallstones or wall thickening visualized. No sonographic Murphy sign noted by sonographer. Common bile duct: Diameter: 4.9 mm. Liver: No focal lesion identified. Within normal limits in parenchymal echogenicity. Portal vein is patent on color Doppler imaging with normal direction of blood flow towards the liver. Other: Partial right renal atrophy with increased echogenicity of medical renal disease. Small volume of upper abdominal ascites. IMPRESSION: 1. No acute sonographic findings. 2. Unremarkable gallbladder. 3. Echogenic partially atrophic right kidney. 4. Small amount of perihepatic ascites. Electronically Signed   By: Telford Nab M.D.   On: 05/06/2021 01:04      Subjective:  Denies any chest pain, shortness of breath, nausea or vomiting Discharge Exam: Vitals:   05/13/21 1446 05/13/21 1500  BP:  (!) 145/118  Pulse: (!) 58 60  Resp: (!) 23 20  Temp:  98.2 F (36.8 C)  SpO2: 99% (!) 87%   Vitals:   05/13/21 1440 05/13/21 1446 05/13/21 1500 05/13/21 1506  BP: 138/66  (!) 145/118   Pulse: (!) 59 (!) 58 60   Resp: 19 (!) 23 20   Temp:   98.2 F (36.8 C)   TempSrc:   Oral Oral  SpO2: 100% 99% (!) 87%   Weight:      Height:        General: Pt is alert, awake, not in acute distress Cardiovascular: RRR, S1/S2 +, no rubs, no gallops Respiratory: CTA bilaterally, no wheezing, no rhonchi Abdominal: Soft, NT, ND, bowel sounds  + Extremities: no edema, no cyanosis    The results of significant diagnostics from this hospitalization (including imaging, microbiology, ancillary and laboratory) are listed below for reference.     Microbiology: Recent Results (from the past 240 hour(s))  Resp Panel by RT-PCR (Flu A&B, Covid) Nasopharyngeal Swab     Status: None   Collection Time: 05/05/21 10:42 PM   Specimen: Nasopharyngeal  Swab; Nasopharyngeal(NP) swabs in vial transport medium  Result Value Ref Range Status   SARS Coronavirus 2 by RT PCR NEGATIVE NEGATIVE Final    Comment: (NOTE) SARS-CoV-2 target nucleic acids are NOT DETECTED.  The SARS-CoV-2 RNA is generally detectable in upper respiratory specimens during the acute phase of infection. The lowest concentration of SARS-CoV-2 viral copies this assay can detect is 138 copies/mL. A negative result does not preclude SARS-Cov-2 infection and should not be used as the sole basis for treatment or other patient management decisions. A negative result may occur with  improper specimen collection/handling, submission of specimen other than nasopharyngeal swab, presence of viral mutation(s) within the areas targeted by this assay, and inadequate number of viral copies(<138 copies/mL). A negative result must be combined with clinical observations, patient history, and epidemiological information. The expected result is Negative.  Fact Sheet for Patients:  EntrepreneurPulse.com.au  Fact Sheet for Healthcare Providers:  IncredibleEmployment.be  This test is no t yet approved or cleared by the [place] FDA and  has been authorized for detection and/or diagnosis of SARS-CoV-2 by FDA under an Emergency Use Authorization (EUA). This EUA will remain  in effect (meaning this test can be used) for the duration of the COVID-19 declaration under Section 564(b)(1) of the Act, 21 U.S.C.section 360bbb-3(b)(1), unless the authorization  is terminated  or revoked sooner.       Influenza A by PCR NEGATIVE NEGATIVE Final   Influenza B by PCR NEGATIVE NEGATIVE Final    Comment: (NOTE) The Xpert Xpress SARS-CoV-2/FLU/RSV plus assay is intended as an aid in the diagnosis of influenza from Nasopharyngeal swab specimens and should not be used as a sole basis for treatment. Nasal washings and aspirates are unacceptable for Xpert Xpress SARS-CoV-2/FLU/RSV testing.  Fact Sheet for Patients: EntrepreneurPulse.com.au  Fact Sheet for Healthcare Providers: IncredibleEmployment.be  This test is not yet approved or cleared by the [place] FDA and has been authorized for detection and/or diagnosis of SARS-CoV-2 by FDA under an Emergency Use Authorization (EUA). This EUA will remain in effect (meaning this test can be used) for the duration of the COVID-19 declaration under Section 564(b)(1) of the Act, 21 U.S.C. section 360bbb-3(b)(1), unless the authorization is terminated or revoked.  Performed at [facility], [place] [address], [place], [place] [address]   Culture, blood (routine x 2)     Status: Abnormal   Collection Time: 05/08/21 10:33 AM   Specimen: BLOOD  Result Value Ref Range Status   Specimen Description BLOOD LEFT ANTECUBITAL  Final   Special Requests   Final    BOTTLES DRAWN AEROBIC AND ANAEROBIC Blood Culture results may not be optimal due to an inadequate volume of blood received in culture bottles   Culture  Setup Time   Final    GRAM POSITIVE COCCI IN BOTH AEROBIC AND ANAEROBIC BOTTLES CRITICAL RESULT CALLED TO, READ BACK BY AND VERIFIED WITH: PHARMD JAMES LEDFORD 05/09/21@4 :55 BY TW    Culture (A)  Final    METHICILLIN RESISTANT STAPHYLOCOCCUS AUREUS STAPHYLOCOCCUS EPIDERMIDIS THE SIGNIFICANCE OF ISOLATING THIS ORGANISM FROM A SINGLE SET OF BLOOD CULTURES WHEN MULTIPLE SETS ARE DRAWN IS UNCERTAIN. PLEASE NOTIFY THE MICROBIOLOGY DEPARTMENT WITHIN ONE WEEK IF  SPECIATION AND SENSITIVITIES ARE REQUIRED. Performed at [facility], [place] [address], [place],  [address]    Report Status 05/11/2021 FINAL  Final   Organism ID, Bacteria METHICILLIN RESISTANT STAPHYLOCOCCUS AUREUS  Final      Susceptibility   Methicillin resistant  staphylococcus aureus - MIC*    CIPROFLOXACIN <=0.5 SENSITIVE Sensitive     ERYTHROMYCIN >=8 RESISTANT Resistant     GENTAMICIN <=0.5 SENSITIVE Sensitive     OXACILLIN >=4 RESISTANT Resistant     TETRACYCLINE <=1 SENSITIVE Sensitive     VANCOMYCIN <=0.5 SENSITIVE Sensitive     TRIMETH/SULFA <=10 SENSITIVE Sensitive     CLINDAMYCIN <=0.25 SENSITIVE Sensitive     RIFAMPIN <=0.5 SENSITIVE Sensitive     Inducible Clindamycin NEGATIVE Sensitive     * METHICILLIN RESISTANT STAPHYLOCOCCUS AUREUS  Blood Culture ID Panel (Reflexed)     Status: Abnormal   Collection Time: 05/08/21 10:33 AM  Result Value Ref Range Status   Enterococcus faecalis NOT DETECTED NOT DETECTED Final   Enterococcus Faecium NOT DETECTED NOT DETECTED Final   Listeria monocytogenes NOT DETECTED NOT DETECTED Final   Staphylococcus species DETECTED (A) NOT DETECTED Final    Comment: CRITICAL RESULT CALLED TO, READ BACK BY AND VERIFIED WITH: PHARMD JAMES LEDFORD 05/09/21@4 :55 BY TW    Staphylococcus aureus (BCID) DETECTED (A) NOT DETECTED Final    Comment: Methicillin (oxacillin)-resistant Staphylococcus aureus (MRSA). MRSA is predictably resistant to beta-lactam antibiotics (except ceftaroline). Preferred therapy is vancomycin unless clinically contraindicated. Patient requires contact precautions if  hospitalized. CRITICAL RESULT CALLED TO, READ BACK BY AND VERIFIED WITH: PHARMD JAMES LEDFORD 05/09/21@4 :55 BY TW    Staphylococcus epidermidis DETECTED (A) NOT DETECTED Final    Comment: CRITICAL RESULT CALLED TO, READ BACK BY AND VERIFIED WITH: PHARMD JAMES LEDFORD 05/09/21@4 :55 BY TW    Staphylococcus lugdunensis NOT DETECTED NOT DETECTED Final    Streptococcus species NOT DETECTED NOT DETECTED Final   Streptococcus agalactiae NOT DETECTED NOT DETECTED Final   Streptococcus pneumoniae NOT DETECTED NOT DETECTED Final   Streptococcus pyogenes NOT DETECTED NOT DETECTED Final   A.calcoaceticus-baumannii NOT DETECTED NOT DETECTED Final   Bacteroides fragilis NOT DETECTED NOT DETECTED Final   Enterobacterales NOT DETECTED NOT DETECTED Final   Enterobacter cloacae complex NOT DETECTED NOT DETECTED Final   Escherichia coli NOT DETECTED NOT DETECTED Final   Klebsiella aerogenes NOT DETECTED NOT DETECTED Final   Klebsiella oxytoca NOT DETECTED NOT DETECTED Final   Klebsiella pneumoniae NOT DETECTED NOT DETECTED Final   Proteus species NOT DETECTED NOT DETECTED Final   Salmonella species NOT DETECTED NOT DETECTED Final   Serratia marcescens NOT DETECTED NOT DETECTED Final   Haemophilus influenzae NOT DETECTED NOT DETECTED Final   Neisseria meningitidis NOT DETECTED NOT DETECTED Final   Pseudomonas aeruginosa NOT DETECTED NOT DETECTED Final   Stenotrophomonas maltophilia NOT DETECTED NOT DETECTED Final   Candida albicans NOT DETECTED NOT DETECTED Final   Candida auris NOT DETECTED NOT DETECTED Final   Candida glabrata NOT DETECTED NOT DETECTED Final   Candida krusei NOT DETECTED NOT DETECTED Final   Candida parapsilosis NOT DETECTED NOT DETECTED Final   Candida tropicalis NOT DETECTED NOT DETECTED Final   Cryptococcus neoformans/gattii NOT DETECTED NOT DETECTED Final   Methicillin resistance mecA/C DETECTED (A) NOT DETECTED Final    Comment: CRITICAL RESULT CALLED TO, READ BACK BY AND VERIFIED WITH: PHARMD JAMES LEDFORD 05/09/21@4 :55 BY TW    Meth resistant mecA/C and MREJ DETECTED (A) NOT DETECTED Final    Comment: CRITICAL RESULT CALLED TO, READ BACK BY AND VERIFIED WITH: PHARMD JAMES LEDFORD 05/09/21@4 :55 BY TW Performed at [facility], [place] [address], [place], [place] [address]   Culture, blood (routine x 2)     Status: None    Collection Time:  05/08/21 10:40 AM   Specimen: BLOOD LEFT HAND  Result Value Ref Range Status   Specimen Description BLOOD LEFT HAND  Final   Special Requests   Final    BOTTLES DRAWN AEROBIC ONLY Blood Culture results may not be optimal due to an inadequate volume of blood received in culture bottles   Culture   Final    NO GROWTH 5 DAYS Performed at [facility], [place] [address], [place], [place] [address]    Report Status 05/13/2021 FINAL  Final  Culture, blood (routine x 2)     Status: None (Preliminary result)   Collection Time: 05/09/21  2:43 PM   Specimen: BLOOD LEFT HAND  Result Value Ref Range Status   Specimen Description BLOOD LEFT HAND  Final   Special Requests   Final    BOTTLES DRAWN AEROBIC ONLY Blood Culture results may not be optimal due to an inadequate volume of blood received in culture bottles   Culture   Final    NO GROWTH 4 DAYS Performed at [facility], [place] [address], [place], [place] [address]    Report Status PENDING  Incomplete  Culture, blood (routine x 2)     Status: None (Preliminary result)   Collection Time: 05/09/21  2:51 PM   Specimen: BLOOD LEFT HAND  Result Value Ref Range Status   Specimen Description BLOOD LEFT HAND  Final   Special Requests   Final    BOTTLES DRAWN AEROBIC ONLY Blood Culture results may not be optimal due to an inadequate volume of blood received in culture bottles   Culture   Final    NO GROWTH 4 DAYS Performed at [facility], [place] [address] [place], [place] [address]    Report Status PENDING  Incomplete  Culture, blood (routine x 2)     Status: None (Preliminary result)   Collection Time: 05/10/21  6:06 AM   Specimen: BLOOD  Result Value Ref Range Status   Specimen Description BLOOD SITE NOT SPECIFIED  Final   Special Requests AEROBIC BOTTLE ONLY Blood Culture adequate volume  Final   Culture   Final    NO GROWTH 3 DAYS Performed at [facility], [address], [place], [place] [address]     Report Status PENDING  Incomplete  Culture, blood (routine x 2)     Status: None (Preliminary result)   Collection Time: 05/10/21  6:12 AM   Specimen: BLOOD LEFT HAND  Result Value Ref Range Status   Specimen Description BLOOD LEFT HAND  Final   Special Requests   Final    AEROBIC BOTTLE ONLY Blood Culture results may not be optimal due to an inadequate volume of blood received in culture bottles   Culture   Final    NO GROWTH 3 DAYS Performed at [facility], [place] [address] [place], [place] [address]    Report Status PENDING  Incomplete  MRSA Next Gen by PCR, Nasal     Status: Abnormal   Collection Time: 05/12/21  8:24 AM   Specimen: Nasal Mucosa; Nasal Swab  Result Value Ref Range Status   MRSA by PCR Next Gen DETECTED (A) NOT DETECTED Final    Comment: RESULT CALLED TO, READ BACK BY AND VERIFIED WITH: WOODY OWENS RN 05/12/2021 @1759  BY JW  (NOTE) The GeneXpert MRSA Assay (FDA approved for NASAL specimens only), is one component of a comprehensive MRSA colonization surveillance program. It is not intended to  diagnose MRSA infection nor to guide or monitor treatment for MRSA infections. Test performance is not FDA approved in patients less than 39 years old. Performed at [facility], [place] [address], [place], [place] [address]      Labs: BNP (last 3 results) Recent Labs    12/14/20 2122 05/05/21 1535  BNP >4,500.0* >2,947.6*   Basic Metabolic Panel: Recent Labs  Lab 05/09/21 0303 05/10/21 0606 05/11/21 0049 05/12/21 0143 05/13/21 0640 05/13/21 1344  NA 136 140 137 135 136 137  K 4.1 3.9 2.8* 5.3* 5.2* 3.1*  CL 95* 102 100 101 99 93*  CO2 26 22 27  20* 17*  --   GLUCOSE 74 114* 160* 108* 90 74  BUN 24* 48* 18 47* 77* 22  CREATININE 5.10* 7.87* 4.14* 6.70* 9.27* 3.50*  CALCIUM 8.1* 8.0* 7.7* 8.7* 9.2  --    Liver Function Tests: Recent Labs  Lab 05/09/21 0303 05/10/21 0606 05/11/21 0049 05/12/21 0143 05/13/21 0640  AST 218* 62* 40 43* 36   ALT 1,066* 664* 492* 368* 277*  ALKPHOS 108 103 99 109 97  BILITOT 3.5* 2.1* 2.0* 1.6* 1.9*  PROT 6.0* 5.6* 5.7* 6.3* 6.2*  ALBUMIN 3.1* 2.9* 2.9* 3.2* 3.3*   No results for input(s): LIPASE, AMYLASE in the last 168 hours. Recent Labs  Lab 05/08/21 1020  AMMONIA 36*   CBC: Recent Labs  Lab 05/08/21 1435 05/08/21 2048 05/10/21 1330 05/13/21 0841 05/13/21 1344  WBC 7.8 7.2 7.4 5.4  --   HGB 12.7* 18.3* 11.1* 12.1* 16.7  HCT 39.8 58.6* 34.1* 36.7* 49.0  MCV 96.8 98.5 97.2 95.3  --   PLT 80* 67* 67* 72*  --    Cardiac Enzymes: No results for input(s): CKTOTAL, CKMB, CKMBINDEX, TROPONINI in the last 168 hours. BNP: Invalid input(s): POCBNP CBG: Recent Labs  Lab 05/06/21 1822 05/07/21 1842 05/13/21 1440  GLUCAP 111* 85 93   D-Dimer No results for input(s): DDIMER in the last 72 hours. Hgb A1c No results for input(s): HGBA1C in the last 72 hours. Lipid Profile No results for input(s): CHOL, HDL, LDLCALC, TRIG, CHOLHDL, LDLDIRECT in the last 72 hours. Thyroid function studies No results for input(s): TSH, T4TOTAL, T3FREE, THYROIDAB in the last 72 hours.  Invalid input(s): FREET3 Anemia work up No results for input(s): VITAMINB12, FOLATE, FERRITIN, TIBC, IRON, RETICCTPCT in the last 72 hours. Urinalysis    Component Value Date/Time   COLORURINE YELLOW 12/05/2020 1339   APPEARANCEUR CLEAR 12/05/2020 1339   LABSPEC 1.016 12/05/2020 1339   PHURINE 7.0 12/05/2020 1339   GLUCOSEU 150 (A) 12/05/2020 1339   HGBUR NEGATIVE 12/05/2020 1339   BILIRUBINUR NEGATIVE 12/05/2020 1339   KETONESUR 5 (A) 12/05/2020 1339   PROTEINUR >=300 (A) 12/05/2020 1339   UROBILINOGEN 1.0 03/19/2014 1051   NITRITE NEGATIVE 12/05/2020 1339   LEUKOCYTESUR NEGATIVE 12/05/2020 1339   Sepsis Labs Invalid input(s): PROCALCITONIN,  WBC,  LACTICIDVEN Microbiology Recent Results (from the past 240 hour(s))  Resp Panel by RT-PCR (Flu A&B, Covid) Nasopharyngeal Swab     Status: None    Collection Time: 05/05/21 10:42 PM   Specimen: Nasopharyngeal Swab; Nasopharyngeal(NP) swabs in vial transport medium  Result Value Ref Range Status   SARS Coronavirus 2 by RT PCR NEGATIVE NEGATIVE Final    Comment: (NOTE) SARS-CoV-2 target nucleic acids are NOT DETECTED.  The SARS-CoV-2 RNA is generally detectable in upper respiratory specimens during the acute phase of infection. The lowest concentration of SARS-CoV-2 viral copies this assay  can detect is 138 copies/mL. A negative result does not preclude SARS-Cov-2 infection and should not be used as the sole basis for treatment or other patient management decisions. A negative result may occur with  improper specimen collection/handling, submission of specimen other than nasopharyngeal swab, presence of viral mutation(s) within the areas targeted by this assay, and inadequate number of viral copies(<138 copies/mL). A negative result must be combined with clinical observations, patient history, and epidemiological information. The expected result is Negative.  Fact Sheet for Patients:  EntrepreneurPulse.com.au  Fact Sheet for Healthcare Providers:  IncredibleEmployment.be  This test is no t yet approved or cleared by the [place] FDA and  has been authorized for detection and/or diagnosis of SARS-CoV-2 by FDA under an Emergency Use Authorization (EUA). This EUA will remain  in effect (meaning this test can be used) for the duration of the COVID-19 declaration under Section 564(b)(1) of the Act, 21 U.S.C.section 360bbb-3(b)(1), unless the authorization is terminated  or revoked sooner.       Influenza A by PCR NEGATIVE NEGATIVE Final   Influenza B by PCR NEGATIVE NEGATIVE Final    Comment: (NOTE) The Xpert Xpress SARS-CoV-2/FLU/RSV plus assay is intended as an aid in the diagnosis of influenza from Nasopharyngeal swab specimens and should not be used as a sole basis for treatment.  Nasal washings and aspirates are unacceptable for Xpert Xpress SARS-CoV-2/FLU/RSV testing.  Fact Sheet for Patients: EntrepreneurPulse.com.au  Fact Sheet for Healthcare Providers: IncredibleEmployment.be  This test is not yet approved or cleared by the [place] FDA and has been authorized for detection and/or diagnosis of SARS-CoV-2 by FDA under an Emergency Use Authorization (EUA). This EUA will remain in effect (meaning this test can be used) for the duration of the COVID-19 declaration under Section 564(b)(1) of the Act, 21 U.S.C. section 360bbb-3(b)(1), unless the authorization is terminated or revoked.  Performed at [facility], [place] [address] [place], [name] [address]   Culture, blood (routine x 2)     Status: Abnormal   Collection Time: 05/08/21 10:33 AM   Specimen: BLOOD  Result Value Ref Range Status   Specimen Description BLOOD LEFT ANTECUBITAL  Final   Special Requests   Final    BOTTLES DRAWN AEROBIC AND ANAEROBIC Blood Culture results may not be optimal due to an inadequate volume of blood received in culture bottles   Culture  Setup Time   Final    GRAM POSITIVE COCCI IN BOTH AEROBIC AND ANAEROBIC BOTTLES CRITICAL RESULT CALLED TO, READ BACK BY AND VERIFIED WITH: PHARMD JAMES LEDFORD 05/09/21@4 :55 BY TW    Culture (A)  Final    METHICILLIN RESISTANT STAPHYLOCOCCUS AUREUS STAPHYLOCOCCUS EPIDERMIDIS THE SIGNIFICANCE OF ISOLATING THIS ORGANISM FROM A SINGLE SET OF BLOOD CULTURES WHEN MULTIPLE SETS ARE DRAWN IS UNCERTAIN. PLEASE NOTIFY THE MICROBIOLOGY DEPARTMENT WITHIN ONE WEEK IF SPECIATION AND SENSITIVITIES ARE REQUIRED. Performed at [facility], [place] [address], [place], [place] [address]    Report Status 05/11/2021 FINAL  Final   Organism ID, Bacteria METHICILLIN RESISTANT STAPHYLOCOCCUS AUREUS  Final      Susceptibility   Methicillin resistant staphylococcus aureus - MIC*    CIPROFLOXACIN <=0.5 SENSITIVE  Sensitive     ERYTHROMYCIN >=8 RESISTANT Resistant     GENTAMICIN <=0.5 SENSITIVE Sensitive     OXACILLIN >=4 RESISTANT Resistant     TETRACYCLINE <=1 SENSITIVE Sensitive     VANCOMYCIN <=0.5 SENSITIVE Sensitive     TRIMETH/SULFA <=10 SENSITIVE Sensitive  CLINDAMYCIN <=0.25 SENSITIVE Sensitive     RIFAMPIN <=0.5 SENSITIVE Sensitive     Inducible Clindamycin NEGATIVE Sensitive     * METHICILLIN RESISTANT STAPHYLOCOCCUS AUREUS  Blood Culture ID Panel (Reflexed)     Status: Abnormal   Collection Time: 05/08/21 10:33 AM  Result Value Ref Range Status   Enterococcus faecalis NOT DETECTED NOT DETECTED Final   Enterococcus Faecium NOT DETECTED NOT DETECTED Final   Listeria monocytogenes NOT DETECTED NOT DETECTED Final   Staphylococcus species DETECTED (A) NOT DETECTED Final    Comment: CRITICAL RESULT CALLED TO, READ BACK BY AND VERIFIED WITH: PHARMD JAMES LEDFORD 05/09/21@4 :55 BY TW    Staphylococcus aureus (BCID) DETECTED (A) NOT DETECTED Final    Comment: Methicillin (oxacillin)-resistant Staphylococcus aureus (MRSA). MRSA is predictably resistant to beta-lactam antibiotics (except ceftaroline). Preferred therapy is vancomycin unless clinically contraindicated. Patient requires contact precautions if  hospitalized. CRITICAL RESULT CALLED TO, READ BACK BY AND VERIFIED WITH: PHARMD JAMES LEDFORD 05/09/21@4 :55 BY TW    Staphylococcus epidermidis DETECTED (A) NOT DETECTED Final    Comment: CRITICAL RESULT CALLED TO, READ BACK BY AND VERIFIED WITH: PHARMD JAMES LEDFORD 05/09/21@4 :55 BY TW    Staphylococcus lugdunensis NOT DETECTED NOT DETECTED Final   Streptococcus species NOT DETECTED NOT DETECTED Final   Streptococcus agalactiae NOT DETECTED NOT DETECTED Final   Streptococcus pneumoniae NOT DETECTED NOT DETECTED Final   Streptococcus pyogenes NOT DETECTED NOT DETECTED Final   A.calcoaceticus-baumannii NOT DETECTED NOT DETECTED Final   Bacteroides fragilis NOT DETECTED NOT DETECTED Final    Enterobacterales NOT DETECTED NOT DETECTED Final   Enterobacter cloacae complex NOT DETECTED NOT DETECTED Final   Escherichia coli NOT DETECTED NOT DETECTED Final   Klebsiella aerogenes NOT DETECTED NOT DETECTED Final   Klebsiella oxytoca NOT DETECTED NOT DETECTED Final   Klebsiella pneumoniae NOT DETECTED NOT DETECTED Final   Proteus species NOT DETECTED NOT DETECTED Final   Salmonella species NOT DETECTED NOT DETECTED Final   Serratia marcescens NOT DETECTED NOT DETECTED Final   Haemophilus influenzae NOT DETECTED NOT DETECTED Final   Neisseria meningitidis NOT DETECTED NOT DETECTED Final   Pseudomonas aeruginosa NOT DETECTED NOT DETECTED Final   Stenotrophomonas maltophilia NOT DETECTED NOT DETECTED Final   Candida albicans NOT DETECTED NOT DETECTED Final   Candida auris NOT DETECTED NOT DETECTED Final   Candida glabrata NOT DETECTED NOT DETECTED Final   Candida krusei NOT DETECTED NOT DETECTED Final   Candida parapsilosis NOT DETECTED NOT DETECTED Final   Candida tropicalis NOT DETECTED NOT DETECTED Final   Cryptococcus neoformans/gattii NOT DETECTED NOT DETECTED Final   Methicillin resistance mecA/C DETECTED (A) NOT DETECTED Final    Comment: CRITICAL RESULT CALLED TO, READ BACK BY AND VERIFIED WITH: PHARMD JAMES LEDFORD 05/09/21@4 :55 BY TW    Meth resistant mecA/C and MREJ DETECTED (A) NOT DETECTED Final    Comment: CRITICAL RESULT CALLED TO, READ BACK BY AND VERIFIED WITH: PHARMD JAMES LEDFORD 05/09/21@4 :55 BY TW Performed at [facility], [place] [address] [place], [place] [address]   Culture, blood (routine x 2)     Status: None   Collection Time: 05/08/21 10:40 AM   Specimen: BLOOD LEFT HAND  Result Value Ref Range Status   Specimen Description BLOOD LEFT HAND  Final   Special Requests   Final    BOTTLES DRAWN AEROBIC ONLY Blood Culture results may not be optimal due to an inadequate volume of blood received in culture bottles   Culture   Final  NO GROWTH 5  DAYS Performed at [facility], [place] [address] [place], [place] [address]    Report Status 05/13/2021 FINAL  Final  Culture, blood (routine x 2)     Status: None (Preliminary result)   Collection Time: 05/09/21  2:43 PM   Specimen: BLOOD LEFT HAND  Result Value Ref Range Status   Specimen Description BLOOD LEFT HAND  Final   Special Requests   Final    BOTTLES DRAWN AEROBIC ONLY Blood Culture results may not be optimal due to an inadequate volume of blood received in culture bottles   Culture   Final    NO GROWTH 4 DAYS Performed at [facility], [place] [address] [place], [place] [address]    Report Status PENDING  Incomplete  Culture, blood (routine x 2)     Status: None (Preliminary result)   Collection Time: 05/09/21  2:51 PM   Specimen: BLOOD LEFT HAND  Result Value Ref Range Status   Specimen Description BLOOD LEFT HAND  Final   Special Requests   Final    BOTTLES DRAWN AEROBIC ONLY Blood Culture results may not be optimal due to an inadequate volume of blood received in culture bottles   Culture   Final    NO GROWTH 4 DAYS Performed at [facility], [place] [address] [place], [place] [address]    Report Status PENDING  Incomplete  Culture, blood (routine x 2)     Status: None (Preliminary result)   Collection Time: 05/10/21  6:06 AM   Specimen: BLOOD  Result Value Ref Range Status   Specimen Description BLOOD SITE NOT SPECIFIED  Final   Special Requests AEROBIC BOTTLE ONLY Blood Culture adequate volume  Final   Culture   Final    NO GROWTH 3 DAYS Performed at [facility], [address] [place], [place] [address]    Report Status PENDING  Incomplete  Culture, blood (routine x 2)     Status: None (Preliminary result)   Collection Time: 05/10/21  6:12 AM   Specimen: BLOOD LEFT HAND  Result Value Ref Range Status   Specimen Description BLOOD LEFT HAND  Final   Special Requests   Final    AEROBIC BOTTLE ONLY Blood Culture results may not be  optimal due to an inadequate volume of blood received in culture bottles   Culture   Final    NO GROWTH 3 DAYS Performed at [facility], [place] [address], [place], [place] [address]    Report Status PENDING  Incomplete  MRSA Next Gen by PCR, Nasal     Status: Abnormal   Collection Time: 05/12/21  8:24 AM   Specimen: Nasal Mucosa; Nasal Swab  Result Value Ref Range Status   MRSA by PCR Next Gen DETECTED (A) NOT DETECTED Final    Comment: RESULT CALLED TO, READ BACK BY AND VERIFIED WITH: WOODY OWENS RN 05/12/2021 @1759  BY JW  (NOTE) The GeneXpert MRSA Assay (FDA approved for NASAL specimens only), is one component of a comprehensive MRSA colonization surveillance program. It is not intended to diagnose MRSA infection nor to guide or monitor treatment for MRSA infections. Test performance is not FDA approved in patients less than 22 years old. Performed at [facility], [place] [address], [place],  [address]      Time coordinating discharge: Over 30 minutes  SIGNED:   Phillips Climes, MD  [facility] 05/13/2021, 4:09 PM Pager  If 7PM-7AM, please contact night-coverage [email or web address] Password [email or web address]

## 2021-05-13 NOTE — Progress Notes (Signed)
PT Cancellation Note  Patient Details Name: Albert Harris MRN: [id number] DOB: 1956-05-01   Cancelled Treatment:    Reason Eval/Treat Not Completed: Patient at procedure or test/unavailable  Patient out of room/off the floor all day.    Arby Barrette, PT Acute Rehabilitation Services  Pager [phone] Office [phone]   Rexanne Mano 05/13/2021, 2:45 PM

## 2021-05-14 ENCOUNTER — Other Ambulatory Visit: Payer: Self-pay | Admitting: *Deleted

## 2021-05-14 ENCOUNTER — Other Ambulatory Visit: Payer: Self-pay

## 2021-05-14 ENCOUNTER — Telehealth: Payer: Self-pay | Admitting: Nephrology

## 2021-05-14 ENCOUNTER — Encounter (HOSPITAL_COMMUNITY): Payer: Self-pay | Admitting: Cardiology

## 2021-05-14 ENCOUNTER — Telehealth: Payer: Self-pay

## 2021-05-14 DIAGNOSIS — N183 Chronic kidney disease, stage 3 unspecified: Secondary | ICD-10-CM

## 2021-05-14 DIAGNOSIS — I160 Hypertensive urgency: Secondary | ICD-10-CM

## 2021-05-14 DIAGNOSIS — Y92009 Unspecified place in unspecified non-institutional (private) residence as the place of occurrence of the external cause: Secondary | ICD-10-CM

## 2021-05-14 DIAGNOSIS — G9341 Metabolic encephalopathy: Secondary | ICD-10-CM

## 2021-05-14 DIAGNOSIS — W19XXXA Unspecified fall, initial encounter: Secondary | ICD-10-CM

## 2021-05-14 LAB — CULTURE, BLOOD (ROUTINE X 2)
Culture: NO GROWTH
Culture: NO GROWTH

## 2021-05-14 NOTE — Progress Notes (Signed)
Late Entry Note: Pt was d/c home yesterday afternoon. Contacted pt's clinic ([facility]) this am to advise them of pt's d/c yesterday and pt will resume tomorrow. Clinic also advised that pt will need iv abx post HD.   Melven Sartorius Renal Navigator [phone]

## 2021-05-14 NOTE — Patient Outreach (Signed)
[facility]) Care Management  05/14/2021  Albert Harris 06-03-1956 [id number]   Referral Date: 12/7 Referral Source: Hospital liaison Referral Reason: Recent hospital discharge Insurance: Traditional Medicare/DCE   Outreach attempt #1, unsuccessful.  HIPAA compliant voice messages left on member's mobile and wife's numbers.  Unable to leave message on member's listed home number.    Plan: RN CM will send outreach letter and follow up within the next 3-4 business days.  Valente David, [place], MSN [place] [phone]

## 2021-05-14 NOTE — Anesthesia Postprocedure Evaluation (Signed)
Anesthesia Post Note  Patient: Albert Harris  Procedure(s) Performed: TRANSESOPHAGEAL ECHOCARDIOGRAM (TEE)     Patient location during evaluation: Endoscopy Anesthesia Type: MAC Level of consciousness: awake and alert Pain management: pain level controlled Vital Signs Assessment: post-procedure vital signs reviewed and stable Respiratory status: spontaneous breathing, nonlabored ventilation, respiratory function stable and patient connected to nasal cannula oxygen Cardiovascular status: stable and blood pressure returned to baseline Postop Assessment: no apparent nausea or vomiting Anesthetic complications: no   No notable events documented.  Last Vitals:  Vitals:   05/13/21 1446 05/13/21 1500  BP:  (!) 145/118  Pulse: (!) 58 60  Resp: (!) 23 20  Temp:  36.8 C  SpO2: 99% (!) 87%    Last Pain:  Vitals:   05/13/21 1506  TempSrc: Oral  PainSc:                  [name]

## 2021-05-14 NOTE — Telephone Encounter (Signed)
Transition Care Management Unsuccessful Follow-up Telephone Call  Date of discharge and from where:  05/13/2021, [facility]   Attempts:  1st Attempt  Reason for unsuccessful TCM follow-up call:  Left voice message on # [phone]. Call back requested to this CM. Call also placed to # [phone] and the phone just rings, no option for voicemail message.  Need to schedule hospital follow up appointment with Juluis Mire, NP @ [facility]

## 2021-05-14 NOTE — Telephone Encounter (Signed)
Transition of care contact from inpatient facility  Date of Discharge: 05/13/21 Date of Contact:05/14/21  attempted  Method of contact: Phone  Attempted to contact patient to discuss transition of care from inpatient admission. Patient did not answer the phone. Message was left on the patient's voicemail with call back number [phone].

## 2021-05-15 ENCOUNTER — Telehealth: Payer: Self-pay

## 2021-05-15 LAB — CULTURE, BLOOD (ROUTINE X 2)
Culture: NO GROWTH
Culture: NO GROWTH
Special Requests: ADEQUATE

## 2021-05-15 NOTE — Telephone Encounter (Signed)
Transition Care Management Unsuccessful Follow-up Telephone Call  Date of discharge and from where:  05/13/2021, [facility]   Attempts:  2nd Attempt  Reason for unsuccessful TCM follow-up call:  Left voice message  on # [phone]. Call back requested to this CM or [facility]. Call also placed to # [phone] and the phone just rings, no option for voicemail message.   Need to schedule hospital follow up appointment with Juluis Mire, NP @ [facility]

## 2021-05-16 ENCOUNTER — Telehealth: Payer: Self-pay

## 2021-05-16 NOTE — Telephone Encounter (Signed)
Transition Care Management Unsuccessful Follow-up Telephone Call   Date of discharge and from where:  05/13/2021, [facility]    Attempts: 3rd Attempt   Reason for unsuccessful TCM follow-up call:  Left voice message  on # [phone]. Call back requested     Need to schedule hospital follow up appointment with Juluis Mire, NP @ [facility]

## 2021-05-19 ENCOUNTER — Other Ambulatory Visit: Payer: Self-pay | Admitting: *Deleted

## 2021-05-19 ENCOUNTER — Telehealth: Payer: Self-pay

## 2021-05-19 NOTE — Telephone Encounter (Signed)
Letter sent to patient requesting he contact [facility] to schedule a hospital follow up appointment as we have not been able to reach him.

## 2021-05-19 NOTE — Patient Outreach (Signed)
[place] [facility]) Care Management  05/19/2021  Albert Harris 1956-03-22 [id number]    Referral Date: 12/7 Referral Source: Hospital liaison Referral Reason: Recent hospital discharge Insurance: Traditional Medicare/DCE     Outreach attempt #2, unsuccessful.  HIPAA compliant voice messages left on member's mobile and wife's numbers.  Unable to leave message on member's listed home number.     Plan: RN CM will follow up within the next 3-4 business days.  Valente David, [place], MSN [place] [phone]

## 2021-05-23 ENCOUNTER — Other Ambulatory Visit: Payer: Self-pay | Admitting: *Deleted

## 2021-05-23 NOTE — Patient Outreach (Signed)
[place] [facility] [place]) Care Management  05/23/2021  Albert Harris Dec 24, 1955 [id number]   Referral Date: 12/7 Referral Source: Hospital liaison Referral Reason: Recent hospital discharge Insurance: Traditional Medicare/DCE     Outreach attempt #3, unsuccessful.  HIPAA compliant voice messages left on member's mobile and wife's numbers.  Unable to leave message on member's listed home number.  Will make 4th and final attempt within the next 4 weeks, if remain unsuccessful will close case due to inability to maintain contact.  Valente David, RN, MSN, [place] Manager [phone]

## 2021-06-11 ENCOUNTER — Encounter (HOSPITAL_COMMUNITY): Payer: Self-pay

## 2021-06-17 ENCOUNTER — Encounter (HOSPITAL_COMMUNITY): Payer: Self-pay

## 2021-06-17 ENCOUNTER — Other Ambulatory Visit: Payer: Self-pay

## 2021-06-17 MED ORDER — CALCIUM ACETATE (PHOS BINDER) 667 MG PO CAPS
ORAL_CAPSULE | ORAL | 3 refills | Status: DC
  Filled 2021-06-17: qty 180, 30d supply, fill #0
  Filled 2021-09-19: qty 180, 30d supply, fill #1

## 2021-06-18 ENCOUNTER — Encounter (HOSPITAL_COMMUNITY): Payer: Self-pay

## 2021-06-18 ENCOUNTER — Encounter (INDEPENDENT_AMBULATORY_CARE_PROVIDER_SITE_OTHER): Payer: Self-pay | Admitting: Primary Care

## 2021-06-18 ENCOUNTER — Ambulatory Visit (INDEPENDENT_AMBULATORY_CARE_PROVIDER_SITE_OTHER): Payer: Medicare Other | Admitting: Primary Care

## 2021-06-18 ENCOUNTER — Other Ambulatory Visit: Payer: Self-pay | Admitting: *Deleted

## 2021-06-18 ENCOUNTER — Other Ambulatory Visit: Payer: Self-pay

## 2021-06-18 VITALS — BP 152/62 | HR 63 | Temp 97.8°F | Ht 68.0 in | Wt 149.0 lb

## 2021-06-18 DIAGNOSIS — N183 Chronic kidney disease, stage 3 unspecified: Secondary | ICD-10-CM

## 2021-06-18 DIAGNOSIS — Z09 Encounter for follow-up examination after completed treatment for conditions other than malignant neoplasm: Secondary | ICD-10-CM

## 2021-06-18 DIAGNOSIS — Z1211 Encounter for screening for malignant neoplasm of colon: Secondary | ICD-10-CM

## 2021-06-18 DIAGNOSIS — L299 Pruritus, unspecified: Secondary | ICD-10-CM | POA: Diagnosis not present

## 2021-06-18 MED ORDER — AMLODIPINE BESYLATE 10 MG PO TABS
10.0000 mg | ORAL_TABLET | Freq: Every morning | ORAL | 1 refills | Status: DC
Start: 2021-06-18 — End: 2021-06-18
  Filled 2021-06-18: qty 90, 90d supply, fill #0

## 2021-06-18 MED ORDER — HYDROXYZINE HCL 10 MG PO TABS
10.0000 mg | ORAL_TABLET | Freq: Three times a day (TID) | ORAL | 0 refills | Status: DC | PRN
  Filled 2021-06-18: qty 30, 10d supply, fill #0

## 2021-06-18 NOTE — Progress Notes (Signed)
°[facility] ° ° °Subjective:  ° [name] is a 66 y.o. male presents for hospital follow up. He is a very nice noncompliant patient. He initially presented to the ER s/p fall at home and his back was hurting. He did not hit his head or lose consciousness.  He denied any associated lightheadedness, dizziness, chest pain, palpitations, dyspnea, cough, dysuria. He was  admit  to the hospital  on 05/05/21, patient was discharged from the hospital on 05/13/21, patient was admitted for: ESRD (end stage renal disease) ,Hypertension, COPD (chronic obstructive pulmonary disease) (HCC) and Hyperkalemia. Today, his biggest problem and concern is itching.  ° °Past Medical History:  °Diagnosis Date  °• Anemia   °• Asthma   °• Carotid stenosis 09/12/2019  °• Chronic kidney disease   ° t, th. sat dialysis  °• Claudication in peripheral vascular disease (HCC) 09/12/2019  °• Complication of anesthesia   ° " i HAVE A HARD TIME WAKING UP " (only one time)  °• COPD (chronic obstructive pulmonary disease) (HCC)   °• COVID-19   ° positive on  06/16/20  °• GERD (gastroesophageal reflux disease)   °• HLD (hyperlipidemia)   °• Hypertension   °• Peripheral vascular disease (HCC)   °• PFO with atrial septal aneurysm   ° by echo 11/2017  °• Pneumonia   °• Stroke (HCC)   °• [name] (transient ischemic attack) 11/2017  °• Tobacco abuse   ° quit 07/17/19  °  ° °No Known Allergies ° °  °Current Outpatient Medications on File Prior to Visit  °Medication Sig Dispense Refill  °• albuterol (VENTOLIN HFA) 108 (90 Base) MCG/ACT inhaler INHALE 1-2 PUFFS INTO THE LUNGS EVERY 6 (SIX) HOURS AS NEEDED FOR WHEEZING OR SHORTNESS OF BREATH. 8 g 0  °• amLODipine (NORVASC) 10 MG tablet Take 1 tablet (10 mg total) by mouth in the morning. 90 tablet 1  °• B Complex-C-Zn-Folic Acid (DIALYVITE 800 WITH ZINC) 0.8 MG TABS Take 1 tablet by mouth at bedtime.    °• calcium acetate (PHOSLO) 667 MG capsule Take 2 capsule by mouth three times a day with meals  180 capsule 6  °• calcium acetate (PHOSLO) 667 MG capsule Take 2 capsule by mouth three times a day with meals 540 capsule 3  °• lidocaine (LIDODERM) 5 % Place 1 patch onto the skin daily. Remove & Discard patch within 12 hours or as directed by MD 15 patch 0  °• lidocaine-prilocaine (EMLA) cream Apply 1 application topically See admin instructions. Apply a small amount to access site 1-2 hours before dialysis,cover with occlusive dressing(saran wrap)    °• metoprolol tartrate (LOPRESSOR) 25 MG tablet TAKE 1 TABLET BY MOUTH TWICE A DAY 60 tablet 6  °• famotidine (PEPCID) 20 MG tablet Take 1 tablet (20 mg total) by mouth 2 (two) times daily. 60 tablet 0  ° °No current facility-administered medications on file prior to visit.  ° °Review of System: °Review of Systems  °Skin:  Positive for itching and rash.  °     Skin discoloration  °All other systems reviewed and are negative. ° °Objective:  °BP (!) 152/62 (BP Location: Left Arm, Patient Position: Sitting, Cuff Size: Normal)    Pulse 63    Temp 97.8 °F (36.6 °C) (Oral)    Ht 5' 8" (1.727 m)    Wt 149 lb (67.6 kg)    SpO2 96%    BMI 22.66 kg/m²  ° °Filed Weights  ° 06/18/21 1346  °  Weight: 149 lb (67.6 kg)  ° ° °Physical Exam: °General Appearance: Well nourished, in no apparent distress. °Eyes: PERRLA, EOMs, conjunctiva no swelling or erythema °Sinuses: No Frontal/maxillary tenderness °ENT/Mouth: Ext aud canals clear, TMs without erythema, bulging.  Hearing normal.  °Neck: Supple, thyroid normal.  °Respiratory: Respiratory effort normal, BS equal bilaterally without rales, rhonchi, wheezing or stridor.  °Cardio: RRR with no MRGs. Brisk peripheral pulses without edema.  °Abdomen: Soft, + BS.  Non tender, no guarding, rebound, hernias, masses. °Lymphatics: Non tender without lymphadenopathy.  °Musculoskeletal: Full ROM, 5/5 strength, normal gait.  °Skin: Warm, dry without rashes, lesions, ecchymosis.  °Neuro: Cranial nerves intact. Normal muscle tone, no cerebellar  symptoms. Sensation intact.  °Psych: Awake and oriented X 3, normal affect, Insight and Judgment appropriate.  ° ° °Assessment:  °Degan was seen today for hospitalization follow-up. ° °Diagnoses and all orders for this visit: ° °Screening for colon cancer °-     Cologuard ° °Pruritus °Secondary to dialysis. Watching him digging and scratching his skin advised to wear socks or cotton gloves on his hands and cut fingernails. Encourage to speak with nephrologist when during rounds and show him his rash  °-     hydrOXYzine (ATARAX) 10 MG tablet; Take 1 tablet (10 mg total) by mouth 3 (three) times daily as needed. ° ° ° ° ° °Stage 3 chronic kidney disease, unspecified whether stage 3a or 3b CKD (HCC) °Per hospital d/c- (generally labs are done at dialysis  °-     CMP14+EGFR °-     CBC with Differential ° °Other orders °-     Discontinue: amLODipine (NORVASC) 10 MG tablet; Take 1 tablet (10 mg total) by mouth in the morning. ° °Hospital discharge follow-up °Retrieved from d/c  °Recommendations for Outpatient Follow-up:  °Follow up with PCP in 1-2 weeks completed  °Please obtain CMP/CBC in one week- completed  °If patient was compliant with dialysis - which he stated was going IV vancomycin schedule until 06/08/2021 should be completed. ° °  °Other orders °-     Discontinue: amLODipine (NORVASC) 10 MG tablet; Take 1 tablet (10 mg total) by mouth in the morning. ° °  ° °This note has been created with Dragon speech recognition software and smart phrase technology. Any transcriptional errors are unintentional.  ° ° P , NP °06/18/2021, 1:52 PM °   °

## 2021-06-18 NOTE — Patient Outreach (Signed)
[place] [facility]) Care Management  06/18/2021  JERRI HARGADON 05-18-1956 [id number]   Referral Date: 12/7 Referral Source: Hospital liaison Referral Reason: Recent hospital discharge Insurance: Traditional Medicare/DCE     Outreach attempt #4, unsuccessful, HIPAA compliant voice message left. No response from member after multiple unsuccessful outreach attempts and letter sent.  Will close case at this time due to inability to maintain contact.  Will notify member and primary MD of case closure.  Valente David, RN, MSN, [place] Manager [phone]

## 2021-06-19 LAB — CMP14+EGFR
ALT: 8 IU/L (ref 0–44)
AST: 17 IU/L (ref 0–40)
Albumin/Globulin Ratio: 1.3 (ref 1.2–2.2)
Albumin: 3.7 g/dL — ABNORMAL LOW (ref 3.8–4.8)
Alkaline Phosphatase: 129 IU/L — ABNORMAL HIGH (ref 44–121)
BUN/Creatinine Ratio: 4 — ABNORMAL LOW (ref 10–24)
BUN: 20 mg/dL (ref 8–27)
Bilirubin Total: 0.5 mg/dL (ref 0.0–1.2)
CO2: 29 mmol/L (ref 20–29)
Calcium: 10.6 mg/dL — ABNORMAL HIGH (ref 8.6–10.2)
Chloride: 95 mmol/L — ABNORMAL LOW (ref 96–106)
Creatinine, Ser: 4.9 mg/dL — ABNORMAL HIGH (ref 0.76–1.27)
Globulin, Total: 2.8 g/dL (ref 1.5–4.5)
Glucose: 116 mg/dL — ABNORMAL HIGH (ref 70–99)
Potassium: 3.5 mmol/L (ref 3.5–5.2)
Sodium: 141 mmol/L (ref 134–144)
Total Protein: 6.5 g/dL (ref 6.0–8.5)
eGFR: 12 mL/min/{1.73_m2} — ABNORMAL LOW (ref >59)

## 2021-06-19 LAB — CBC WITH DIFFERENTIAL/PLATELET
Basophils Absolute: 0.1 10*3/uL (ref 0.0–0.2)
Basos: 1 %
EOS (ABSOLUTE): 1 10*3/uL — ABNORMAL HIGH (ref 0.0–0.4)
Eos: 10 %
Hematocrit: 31.6 % — ABNORMAL LOW (ref 37.5–51.0)
Hemoglobin: 10.4 g/dL — ABNORMAL LOW (ref 13.0–17.7)
Immature Grans (Abs): 0 10*3/uL (ref 0.0–0.1)
Immature Granulocytes: 0 %
Lymphocytes Absolute: 1.5 10*3/uL (ref 0.7–3.1)
Lymphs: 16 %
MCH: 31.6 pg (ref 26.6–33.0)
MCHC: 32.9 g/dL (ref 31.5–35.7)
MCV: 96 fL (ref 79–97)
Monocytes Absolute: 0.8 10*3/uL (ref 0.1–0.9)
Monocytes: 8 %
Neutrophils Absolute: 6.2 10*3/uL (ref 1.4–7.0)
Neutrophils: 65 %
Platelets: 142 10*3/uL — ABNORMAL LOW (ref 150–450)
RBC: 3.29 x10E6/uL — ABNORMAL LOW (ref 4.14–5.80)
RDW: 14.3 % (ref 11.6–15.4)
WBC: 9.6 10*3/uL (ref 3.4–10.8)

## 2021-06-20 ENCOUNTER — Other Ambulatory Visit: Payer: Self-pay

## 2021-07-08 ENCOUNTER — Other Ambulatory Visit: Payer: Self-pay

## 2021-08-07 ENCOUNTER — Emergency Department (HOSPITAL_COMMUNITY): Payer: Medicare Other

## 2021-08-07 ENCOUNTER — Emergency Department (HOSPITAL_COMMUNITY)
Admission: EM | Admit: 2021-08-07 | Discharge: 2021-08-07 | Disposition: A | Discharge status: Home / Self Care | Payer: Medicare Other | Attending: Emergency Medicine | Admitting: Emergency Medicine

## 2021-08-07 ENCOUNTER — Other Ambulatory Visit: Payer: Self-pay

## 2021-08-07 DIAGNOSIS — N186 End stage renal disease: Secondary | ICD-10-CM | POA: Insufficient documentation

## 2021-08-07 DIAGNOSIS — Z131 Encounter for screening for diabetes mellitus: Secondary | ICD-10-CM | POA: Diagnosis not present

## 2021-08-07 DIAGNOSIS — Y92410 Unspecified street and highway as the place of occurrence of the external cause: Secondary | ICD-10-CM | POA: Diagnosis not present

## 2021-08-07 DIAGNOSIS — R0789 Other chest pain: Secondary | ICD-10-CM | POA: Diagnosis present

## 2021-08-07 DIAGNOSIS — R109 Unspecified abdominal pain: Secondary | ICD-10-CM | POA: Insufficient documentation

## 2021-08-07 DIAGNOSIS — R413 Other amnesia: Secondary | ICD-10-CM | POA: Diagnosis present

## 2021-08-07 DIAGNOSIS — Z992 Dependence on renal dialysis: Secondary | ICD-10-CM | POA: Insufficient documentation

## 2021-08-07 LAB — CBC WITH DIFFERENTIAL/PLATELET
Abs Immature Granulocytes: 0.03 10*3/uL (ref 0.00–0.07)
Basophils Absolute: 0.1 10*3/uL (ref 0.0–0.1)
Basophils Relative: 1 %
Eosinophils Absolute: 0.6 10*3/uL — ABNORMAL HIGH (ref 0.0–0.5)
Eosinophils Relative: 9 %
HCT: 35.7 % — ABNORMAL LOW (ref 39.0–52.0)
Hemoglobin: 11.6 g/dL — ABNORMAL LOW (ref 13.0–17.0)
Immature Granulocytes: 0 %
Lymphocytes Relative: 23 %
Lymphs Abs: 1.6 10*3/uL (ref 0.7–4.0)
MCH: 33.3 pg (ref 26.0–34.0)
MCHC: 32.5 g/dL (ref 30.0–36.0)
MCV: 102.6 fL — ABNORMAL HIGH (ref 80.0–100.0)
Monocytes Absolute: 0.6 10*3/uL (ref 0.1–1.0)
Monocytes Relative: 8 %
Neutro Abs: 4.1 10*3/uL (ref 1.7–7.7)
Neutrophils Relative %: 59 %
Platelets: 149 10*3/uL — ABNORMAL LOW (ref 150–400)
RBC: 3.48 MIL/uL — ABNORMAL LOW (ref 4.22–5.81)
RDW: 17.2 % — ABNORMAL HIGH (ref 11.5–15.5)
WBC: 7.1 10*3/uL (ref 4.0–10.5)
nRBC: 0 % (ref 0.0–0.2)

## 2021-08-07 LAB — I-STAT CHEM 8, ED
BUN: 42 mg/dL — ABNORMAL HIGH (ref 8–23)
Calcium, Ion: 0.96 mmol/L — ABNORMAL LOW (ref 1.15–1.40)
Chloride: 95 mmol/L — ABNORMAL LOW (ref 98–111)
Creatinine, Ser: 7.4 mg/dL — ABNORMAL HIGH (ref 0.61–1.24)
Glucose, Bld: 86 mg/dL (ref 70–99)
HCT: 34 % — ABNORMAL LOW (ref 39.0–52.0)
Hemoglobin: 11.6 g/dL — ABNORMAL LOW (ref 13.0–17.0)
Potassium: 3.8 mmol/L (ref 3.5–5.1)
Sodium: 139 mmol/L (ref 135–145)
TCO2: 37 mmol/L — ABNORMAL HIGH (ref 22–32)

## 2021-08-07 LAB — CBG MONITORING, ED: Glucose-Capillary: 82 mg/dL (ref 70–99)

## 2021-08-07 LAB — MAGNESIUM: Magnesium: 2.3 mg/dL (ref 1.7–2.4)

## 2021-08-07 LAB — COMPREHENSIVE METABOLIC PANEL
ALT: 9 U/L (ref 0–44)
AST: 25 U/L (ref 15–41)
Albumin: 3.5 g/dL (ref 3.5–5.0)
Alkaline Phosphatase: 95 U/L (ref 38–126)
Anion gap: 15 (ref 5–15)
BUN: 34 mg/dL — ABNORMAL HIGH (ref 8–23)
CO2: 30 mmol/L (ref 22–32)
Calcium: 8.8 mg/dL — ABNORMAL LOW (ref 8.9–10.3)
Chloride: 94 mmol/L — ABNORMAL LOW (ref 98–111)
Creatinine, Ser: 6.79 mg/dL — ABNORMAL HIGH (ref 0.61–1.24)
GFR, Estimated: 8 mL/min — ABNORMAL LOW (ref >60)
Glucose, Bld: 91 mg/dL (ref 70–99)
Potassium: 3.9 mmol/L (ref 3.5–5.1)
Sodium: 139 mmol/L (ref 135–145)
Total Bilirubin: 1.8 mg/dL — ABNORMAL HIGH (ref 0.3–1.2)
Total Protein: 6.8 g/dL (ref 6.5–8.1)

## 2021-08-07 MED ORDER — IOHEXOL 300 MG/ML  SOLN
100.0000 mL | Freq: Once | INTRAMUSCULAR | Status: AC | PRN
  Administered 2021-08-07: 100 mL via INTRAVENOUS
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
## 2021-08-07 NOTE — Discharge Instructions (Signed)
Pancreas and lung nodules seen on CT today that will need to be followed up by your PCP. ?
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
## 2021-08-18 ENCOUNTER — Other Ambulatory Visit: Payer: Self-pay

## 2021-08-18 ENCOUNTER — Emergency Department (HOSPITAL_COMMUNITY)
Admission: EM | Admit: 2021-08-18 | Discharge: 2021-08-18 | Disposition: A | Discharge status: Home / Self Care | Payer: Medicare Other | Attending: Emergency Medicine | Admitting: Emergency Medicine

## 2021-08-18 DIAGNOSIS — R202 Paresthesia of skin: Secondary | ICD-10-CM | POA: Diagnosis not present

## 2021-08-18 DIAGNOSIS — S161XXA Strain of muscle, fascia and tendon at neck level, initial encounter: Secondary | ICD-10-CM | POA: Diagnosis not present

## 2021-08-18 DIAGNOSIS — N186 End stage renal disease: Secondary | ICD-10-CM | POA: Diagnosis not present

## 2021-08-18 DIAGNOSIS — S199XXA Unspecified injury of neck, initial encounter: Secondary | ICD-10-CM | POA: Diagnosis present

## 2021-08-18 DIAGNOSIS — Z992 Dependence on renal dialysis: Secondary | ICD-10-CM | POA: Insufficient documentation

## 2021-08-18 DIAGNOSIS — Y92410 Unspecified street and highway as the place of occurrence of the external cause: Secondary | ICD-10-CM | POA: Diagnosis not present

## 2021-08-18 MED ORDER — METHYLPREDNISOLONE 4 MG PO TBPK: ORAL_TABLET | ORAL | 0 refills | Status: DC

## 2021-08-18 MED ORDER — ACETAMINOPHEN 500 MG PO TABS
1000.0000 mg | ORAL_TABLET | Freq: Once | ORAL | Status: AC
Start: 2021-08-18 — End: 2021-08-18
  Administered 2021-08-18: 1000 mg via ORAL
  Filled 2021-08-18: qty 2

## 2021-08-18 MED ORDER — PREDNISONE 20 MG PO TABS
60.0000 mg | ORAL_TABLET | Freq: Once | ORAL | Status: AC
Start: 2021-08-18 — End: 2021-08-18
  Administered 2021-08-18: 60 mg via ORAL
  Filled 2021-08-18: qty 3
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
## 2021-08-18 NOTE — ED Notes (Signed)
Reviewed discharge instructions with patient. Follow-up care and medications reviewed. Patient  verbalized understanding. Patient A&Ox4, VSS, and ambulatory with steady gait upon discharge.  °
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
## 2021-08-25 IMAGING — DX DG CHEST 1V PORT
1 series · 1 of 1 positions shown · non-contrast
Comparison: Radiograph 06/19/2020, CT 06/18/2020

CLINICAL DATA: Hypoxia

EXAM:
PORTABLE CHEST 1 VIEW

[chest ap]
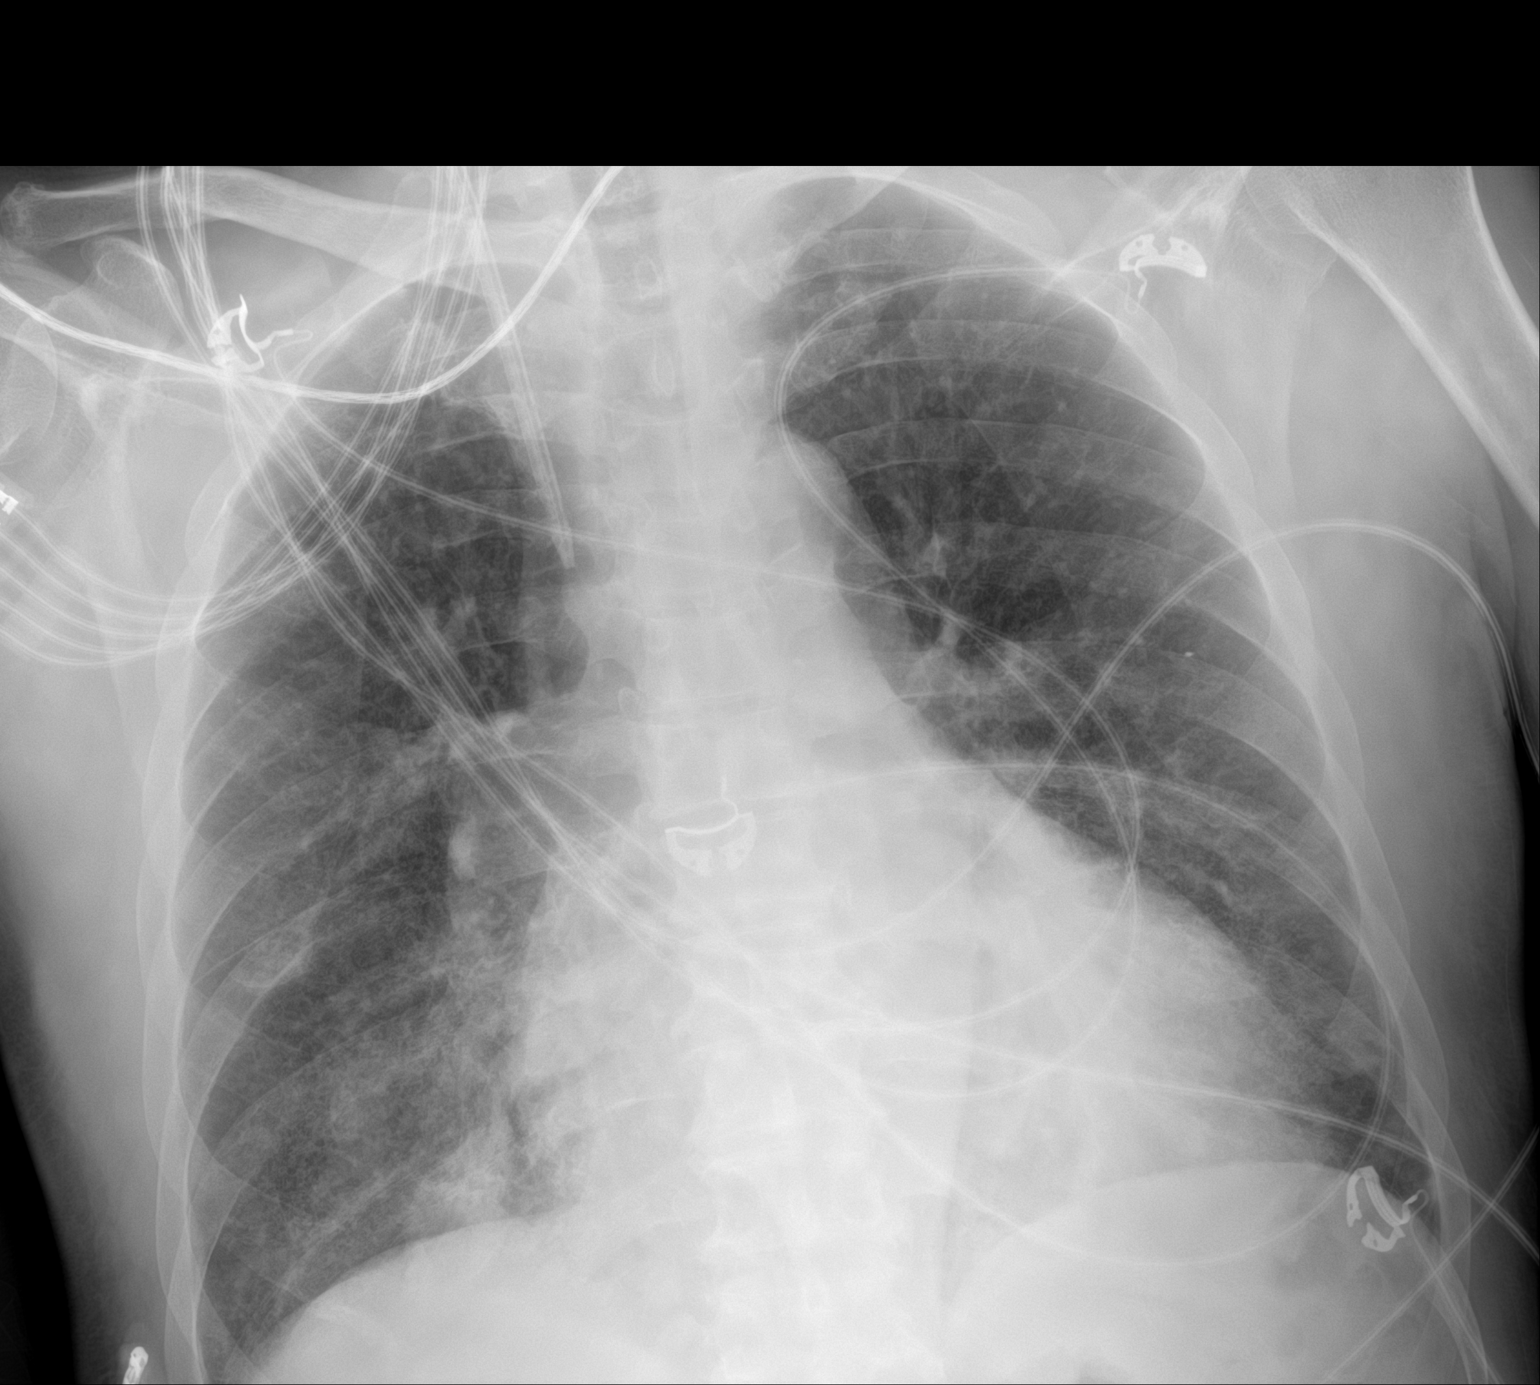

[1 of 1 positions shown; findings below may reference images not displayed]

FINDINGS: Dual lumen central venous catheter tip terminates in the mid SVC.

Telemetry leads and external support devices overlie the chest

Some slight interval clearing of the diffuse multifocal mixed
interstitial and patchy airspace opacities throughout both lungs in
a mid to lower lung predominance. No pneumothorax or visible
effusion the portion of the right costophrenic sulcus is collimated.
Stable cardiomegaly. No acute osseous or soft tissue abnormality.
Degenerative changes are present in the imaged spine and shoulders.
IMPRESSION: 1. Slight interval improvement of the diffuse multifocal opacities
throughout both lungs could reflect improving infection or edema.
2. Stable cardiomegaly.
3. Lines and tubes as above.
4.  Aortic Atherosclerosis (R598G-L29.9).

## 2021-08-27 ENCOUNTER — Ambulatory Visit (HOSPITAL_COMMUNITY)
Admission: EM | Admit: 2021-08-27 | Discharge: 2021-08-27 | Disposition: A | Discharge status: Home / Self Care | Payer: Medicare Other

## 2021-08-27 IMAGING — DX DG CHEST 1V PORT
1 series · 1 of 1 positions shown · non-contrast
Comparison: 06/20/2020 chest radiograph.

CLINICAL DATA: 88OOJ-OH pneumonia

EXAM:
PORTABLE CHEST 1 VIEW

[chest ap]
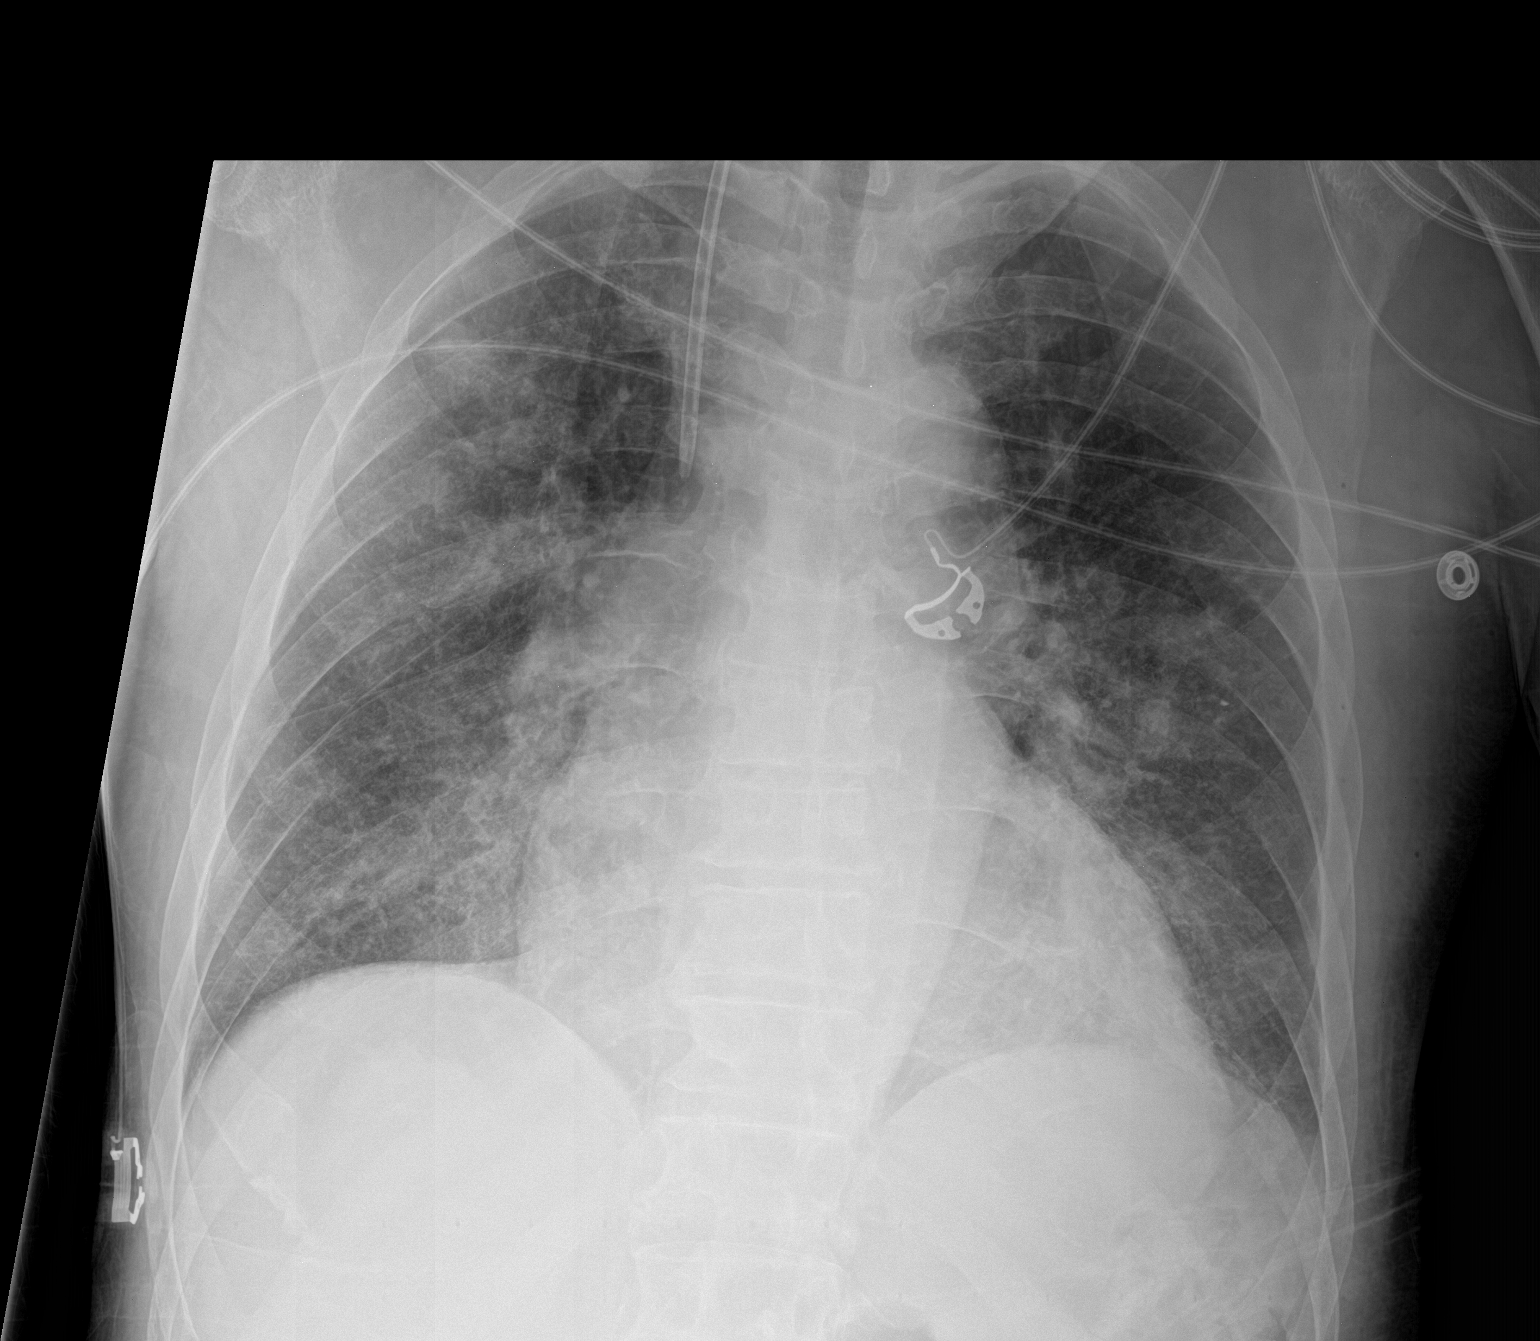

[1 of 1 positions shown; findings below may reference images not displayed]

FINDINGS: Right internal jugular central venous catheter terminates in the
upper third of the SVC. Stable cardiomediastinal silhouette with
borderline mild cardiomegaly. No pneumothorax. No pleural effusion.
Patchy opacities throughout the right greater than left lungs,
similar.
IMPRESSION: Stable patchy opacities throughout the right greater than left
lungs, compatible with 88OOJ-OH pneumonia.

## 2021-08-28 ENCOUNTER — Inpatient Hospital Stay (HOSPITAL_COMMUNITY)
Admission: EM | Admit: 2021-08-28 | Discharge: 2021-08-30 | DRG: 604 | Disposition: A | Discharge status: Home / Self Care | Payer: Medicare Other | Attending: Internal Medicine | Admitting: Internal Medicine

## 2021-08-28 ENCOUNTER — Emergency Department (HOSPITAL_COMMUNITY)
Admit: 2021-08-28 | Discharge: 2021-08-28 | Disposition: A | Discharge status: Home / Self Care | Payer: Medicare Other | Attending: Vascular Surgery | Admitting: Vascular Surgery

## 2021-08-28 ENCOUNTER — Other Ambulatory Visit: Payer: Self-pay

## 2021-08-28 ENCOUNTER — Encounter (HOSPITAL_COMMUNITY): Payer: Self-pay | Admitting: Pharmacy Technician

## 2021-08-28 DIAGNOSIS — Z8673 Personal history of transient ischemic attack (TIA), and cerebral infarction without residual deficits: Secondary | ICD-10-CM | POA: Diagnosis not present

## 2021-08-28 DIAGNOSIS — D539 Nutritional anemia, unspecified: Secondary | ICD-10-CM | POA: Diagnosis present

## 2021-08-28 DIAGNOSIS — I10 Essential (primary) hypertension: Secondary | ICD-10-CM | POA: Diagnosis present

## 2021-08-28 DIAGNOSIS — N186 End stage renal disease: Secondary | ICD-10-CM | POA: Diagnosis present

## 2021-08-28 DIAGNOSIS — I70238 Atherosclerosis of native arteries of right leg with ulceration of other part of lower right leg: Secondary | ICD-10-CM | POA: Diagnosis present

## 2021-08-28 DIAGNOSIS — Z79899 Other long term (current) drug therapy: Secondary | ICD-10-CM | POA: Diagnosis not present

## 2021-08-28 DIAGNOSIS — Z888 Allergy status to other drugs, medicaments and biological substances status: Secondary | ICD-10-CM | POA: Diagnosis not present

## 2021-08-28 DIAGNOSIS — Y929 Unspecified place or not applicable: Secondary | ICD-10-CM | POA: Diagnosis not present

## 2021-08-28 DIAGNOSIS — Z20822 Contact with and (suspected) exposure to covid-19: Secondary | ICD-10-CM | POA: Diagnosis present

## 2021-08-28 DIAGNOSIS — I739 Peripheral vascular disease, unspecified: Secondary | ICD-10-CM | POA: Diagnosis present

## 2021-08-28 DIAGNOSIS — I5022 Chronic systolic (congestive) heart failure: Secondary | ICD-10-CM | POA: Diagnosis present

## 2021-08-28 DIAGNOSIS — E785 Hyperlipidemia, unspecified: Secondary | ICD-10-CM | POA: Diagnosis present

## 2021-08-28 DIAGNOSIS — Z8616 Personal history of COVID-19: Secondary | ICD-10-CM

## 2021-08-28 DIAGNOSIS — Z8249 Family history of ischemic heart disease and other diseases of the circulatory system: Secondary | ICD-10-CM

## 2021-08-28 DIAGNOSIS — Z992 Dependence on renal dialysis: Secondary | ICD-10-CM

## 2021-08-28 DIAGNOSIS — I70261 Atherosclerosis of native arteries of extremities with gangrene, right leg: Secondary | ICD-10-CM | POA: Diagnosis not present

## 2021-08-28 DIAGNOSIS — L97819 Non-pressure chronic ulcer of other part of right lower leg with unspecified severity: Secondary | ICD-10-CM | POA: Diagnosis present

## 2021-08-28 DIAGNOSIS — W208XXA Other cause of strike by thrown, projected or falling object, initial encounter: Secondary | ICD-10-CM | POA: Diagnosis present

## 2021-08-28 DIAGNOSIS — Z87891 Personal history of nicotine dependence: Secondary | ICD-10-CM | POA: Diagnosis not present

## 2021-08-28 DIAGNOSIS — E875 Hyperkalemia: Secondary | ICD-10-CM | POA: Diagnosis present

## 2021-08-28 DIAGNOSIS — I70221 Atherosclerosis of native arteries of extremities with rest pain, right leg: Secondary | ICD-10-CM

## 2021-08-28 DIAGNOSIS — J449 Chronic obstructive pulmonary disease, unspecified: Secondary | ICD-10-CM | POA: Diagnosis present

## 2021-08-28 DIAGNOSIS — I132 Hypertensive heart and chronic kidney disease with heart failure and with stage 5 chronic kidney disease, or end stage renal disease: Secondary | ICD-10-CM | POA: Diagnosis present

## 2021-08-28 DIAGNOSIS — S91101A Unspecified open wound of right great toe without damage to nail, initial encounter: Secondary | ICD-10-CM | POA: Diagnosis present

## 2021-08-28 LAB — BASIC METABOLIC PANEL
Anion gap: 18 — ABNORMAL HIGH (ref 5–15)
BUN: 59 mg/dL — ABNORMAL HIGH (ref 8–23)
CO2: 23 mmol/L (ref 22–32)
Calcium: 9.7 mg/dL (ref 8.9–10.3)
Chloride: 97 mmol/L — ABNORMAL LOW (ref 98–111)
Creatinine, Ser: 9.08 mg/dL — ABNORMAL HIGH (ref 0.61–1.24)
GFR, Estimated: 6 mL/min — ABNORMAL LOW (ref >60)
Glucose, Bld: 73 mg/dL (ref 70–99)
Potassium: 5.6 mmol/L — ABNORMAL HIGH (ref 3.5–5.1)
Sodium: 138 mmol/L (ref 135–145)

## 2021-08-28 LAB — CBC WITH DIFFERENTIAL/PLATELET
Abs Immature Granulocytes: 0.07 K/uL (ref 0.00–0.07)
Basophils Absolute: 0.1 K/uL (ref 0.0–0.1)
Basophils Relative: 1 %
Eosinophils Absolute: 0.4 K/uL (ref 0.0–0.5)
Eosinophils Relative: 3 %
HCT: 34.7 % — ABNORMAL LOW (ref 39.0–52.0)
Hemoglobin: 10.8 g/dL — ABNORMAL LOW (ref 13.0–17.0)
Immature Granulocytes: 1 %
Lymphocytes Relative: 18 %
Lymphs Abs: 2 K/uL (ref 0.7–4.0)
MCH: 33.6 pg (ref 26.0–34.0)
MCHC: 31.1 g/dL (ref 30.0–36.0)
MCV: 108.1 fL — ABNORMAL HIGH (ref 80.0–100.0)
Monocytes Absolute: 1 K/uL (ref 0.1–1.0)
Monocytes Relative: 9 %
Neutro Abs: 7.6 K/uL (ref 1.7–7.7)
Neutrophils Relative %: 68 %
Platelets: 175 K/uL (ref 150–400)
RBC: 3.21 MIL/uL — ABNORMAL LOW (ref 4.22–5.81)
RDW: 18.1 % — ABNORMAL HIGH (ref 11.5–15.5)
WBC: 11.1 K/uL — ABNORMAL HIGH (ref 4.0–10.5)
nRBC: 0 % (ref 0.0–0.2)

## 2021-08-28 LAB — LACTIC ACID, PLASMA: Lactic Acid, Venous: 2.9 mmol/L (ref 0.5–1.9)

## 2021-08-28 LAB — PROTIME-INR
INR: 1.2 (ref 0.8–1.2)
Prothrombin Time: 15 s (ref 11.4–15.2)

## 2021-08-28 MED ORDER — OXYCODONE-ACETAMINOPHEN 5-325 MG PO TABS
1.0000 | ORAL_TABLET | Freq: Once | ORAL | Status: AC
  Administered 2021-08-28: 1 via ORAL
  Filled 2021-08-28: qty 1

## 2021-08-28 MED ORDER — HYDROMORPHONE HCL 1 MG/ML IJ SOLN
0.5000 mg | INTRAMUSCULAR | Status: DC | PRN
  Administered 2021-08-28 – 2021-08-30 (×4): 0.5 mg via INTRAVENOUS
  Filled 2021-08-28 (×2): qty 1
  Filled 2021-08-28: qty 0.5

## 2021-08-28 MED ORDER — BISACODYL 5 MG PO TBEC: 5.0000 mg | DELAYED_RELEASE_TABLET | Freq: Every day | ORAL | Status: DC | PRN

## 2021-08-28 MED ORDER — METOPROLOL TARTRATE 25 MG PO TABS
25.0000 mg | ORAL_TABLET | Freq: Two times a day (BID) | ORAL | Status: DC
  Administered 2021-08-28 – 2021-08-30 (×4): 25 mg via ORAL
  Filled 2021-08-28 (×4): qty 1

## 2021-08-28 MED ORDER — AMLODIPINE BESYLATE 10 MG PO TABS
10.0000 mg | ORAL_TABLET | Freq: Every day | ORAL | Status: DC
  Administered 2021-08-28 – 2021-08-30 (×3): 10 mg via ORAL
  Filled 2021-08-28 (×3): qty 1

## 2021-08-28 MED ORDER — HEPARIN SODIUM (PORCINE) 5000 UNIT/ML IJ SOLN
5000.0000 [IU] | Freq: Three times a day (TID) | INTRAMUSCULAR | Status: DC
  Administered 2021-08-28 – 2021-08-29 (×3): 5000 [IU] via SUBCUTANEOUS
  Filled 2021-08-28 (×3): qty 1

## 2021-08-28 MED ORDER — ALBUTEROL SULFATE (2.5 MG/3ML) 0.083% IN NEBU: 2.5000 mg | INHALATION_SOLUTION | Freq: Four times a day (QID) | RESPIRATORY_TRACT | Status: DC | PRN

## 2021-08-28 MED ORDER — POLYETHYLENE GLYCOL 3350 17 G PO PACK: 17.0000 g | PACK | Freq: Every day | ORAL | Status: DC | PRN

## 2021-08-28 MED ORDER — ACETAMINOPHEN 650 MG RE SUPP: 650.0000 mg | Freq: Four times a day (QID) | RECTAL | Status: DC | PRN

## 2021-08-28 MED ORDER — ACETAMINOPHEN 325 MG PO TABS: 650.0000 mg | ORAL_TABLET | Freq: Four times a day (QID) | ORAL | Status: DC | PRN

## 2021-08-28 MED ORDER — SODIUM ZIRCONIUM CYCLOSILICATE 10 G PO PACK
10.0000 g | PACK | ORAL | Status: AC
  Administered 2021-08-28: 10 g via ORAL
  Filled 2021-08-28: qty 1

## 2021-08-28 NOTE — ED Notes (Signed)
Nephrology has been paged again [phone] ?

## 2021-08-28 NOTE — ED Notes (Signed)
Diet Ginger ale given (8 floz )  ?

## 2021-08-28 NOTE — Progress Notes (Signed)
VASCULAR LAB ? ? ? ?ABIs have been performed. ? ?See CV proc for preliminary results. ? ? ?Pattie Flaharty, RVT ?08/28/2021, 2:56 PM ? ?

## 2021-08-28 NOTE — Consult Note (Addendum)
?Hospital Consult ? ? ? ?Reason for Consult: Right great toe wound ?Requesting Physician: ER ?MRN #:  [id number] ? ?History of Present Illness: This is a 66 y.o. male with past medical history significant for hypertension, hyperlipidemia, end-stage renal disease on hemodialysis, and tobacco history who presented to the emergency department with a right great toe wound.  Patient states he dropped a jar of grape jelly on his great toe this past Saturday.  Patient states he presented to the urgent care yesterday due to increase in surrounding erythema.  Patient is well-known to the [facility] office with history of bilateral external iliac artery stenting in January 2022 by Dr. Scot Dock.  He also has known right SFA occlusion.  He denies rest pain in right foot.  His last completed dialysis treatment via right forearm loop graft was on Tuesday.  Patient states he quit smoking about 2 years ago. ? ?Past Medical History:  ?Diagnosis Date  ? Anemia   ? Asthma   ? Carotid stenosis 09/12/2019  ? Chronic kidney disease   ? t, th. sat dialysis  ? Claudication in peripheral vascular disease ([place]) 09/12/2019  ? Complication of anesthesia   ? " i HAVE A HARD TIME WAKING UP " (only one time)  ? COPD (chronic obstructive pulmonary disease) ([place])   ? COVID-19   ? positive on  06/16/20  ? GERD (gastroesophageal reflux disease)   ? HLD (hyperlipidemia)   ? Hypertension   ? Peripheral vascular disease ([place])   ? PFO with atrial septal aneurysm   ? by echo 11/2017  ? Pneumonia   ? Stroke [facility])   ? [name] (transient ischemic attack) 11/2017  ? Tobacco abuse   ? quit 07/17/19  ? ? ?Past Surgical History:  ?Procedure Laterality Date  ? ABDOMINAL AORTOGRAM W/LOWER EXTREMITY N/A 06/21/2020  ? Procedure: ABDOMINAL AORTOGRAM W/LOWER EXTREMITY;  Surgeon: Angelia Mould, MD;  Location: [place] [facility];  Service: Cardiovascular;  Laterality: N/A;  ? AV FISTULA PLACEMENT Left 03/28/2020  ? Procedure: ARTERIOVENOUS (AV) FISTULA CREATION LEFT;  Surgeon:  [name], MD;  Location: [place];  Service: Vascular;  Laterality: Left;  ? AV FISTULA PLACEMENT Right 09/23/2020  ? Procedure: RIGHT Arm ARTERIOVENOUS GRAFT CREATION.;  Surgeon: Angelia Mould, MD;  [facility];  Service: Vascular;  Laterality: Right;  ? COLONOSCOPY    ? COLONOSCOPY WITH PROPOFOL N/A 06/27/2020  ? Procedure: COLONOSCOPY WITH PROPOFOL;  Surgeon: Carol Ada, MD;  Location: [place];  Service: Endoscopy;  Laterality: N/A;  ? FINGER SURGERY    ? HEMOSTASIS CLIP PLACEMENT  06/27/2020  ? Procedure: HEMOSTASIS CLIP PLACEMENT;  Surgeon: Carol Ada, MD;  Location: [place];  Service: Endoscopy;;  ? INSERTION OF DIALYSIS CATHETER Right 06/24/2020  ? Procedure: INSERTION OF TUNNELED  DIALYSIS CATHETER; Removal of temporary dialysis catheter right neck;  Surgeon: Angelia Mould, MD;  Location: [facility] OR;  Service: Vascular;  Laterality: Right;  ? LIGATION OF ARTERIOVENOUS  FISTULA Left 07/24/2020  ? Procedure: LIGATION OF LEFT BRACHIOCEPHALIC ARTERIOVENOUS  FISTULA;  Surgeon: [name], MD;  Location: [place];  Service: Vascular;  Laterality: Left;  ? PERIPHERAL VASCULAR INTERVENTION Right 06/21/2020  ? Procedure: PERIPHERAL VASCULAR INTERVENTION;  Surgeon: Angelia Mould, MD;  Location: [place] [facility];  Service: Cardiovascular;  Laterality: Right;  ? TEE WITHOUT CARDIOVERSION N/A 05/13/2021  ? Procedure: TRANSESOPHAGEAL ECHOCARDIOGRAM (TEE);  Surgeon: Donato Heinz, MD;  Location: [place];  Service: Cardiovascular;  Laterality: N/A;  ? ? ?  Allergies  ?Allergen Reactions  ? Lidocaine-Prilocaine Rash  ?  Caused red marks up and down arm  ? ? ?Prior to Admission medications   ?Medication Sig Start Date End Date Taking? Authorizing Provider  ?albuterol (VENTOLIN HFA) 108 (90 Base) MCG/ACT inhaler INHALE 1-2 PUFFS INTO THE LUNGS EVERY 6 (SIX) HOURS AS NEEDED FOR WHEEZING OR SHORTNESS OF BREATH. 07/02/20 10/11/21 Yes Maness, Philip, MD  ?AMLODIPINE  BESYLATE PO Take 1 tablet by mouth daily.   Yes [provider]  ?calcium acetate (PHOSLO) 667 MG capsule Take 2 capsule by mouth three times a day with meals ?Patient taking differently: Take 1,334 mg by mouth 3 (three) times daily with meals. 06/17/21  Yes   ?metoprolol tartrate (LOPRESSOR) 25 MG tablet TAKE 1 TABLET BY MOUTH TWICE A DAY ?Patient taking differently: Take 25 mg by mouth 2 (two) times daily. 07/25/20 10/11/21 Yes Corliss Parish, MD  ?famotidine (PEPCID) 20 MG tablet Take 1 tablet (20 mg total) by mouth 2 (two) times daily. ?Patient not taking: Reported on 08/28/2021 05/07/21 06/08/21  Barb Merino, MD  ?hydrOXYzine (ATARAX) 10 MG tablet Take 1 tablet (10 mg total) by mouth 3 (three) times daily as needed. ?Patient not taking: Reported on 08/28/2021 06/18/21   Kerin Perna, NP  ?lidocaine (LIDODERM) 5 % Place 1 patch onto the skin daily. Remove & Discard patch within 12 hours or as directed by MD ?Patient not taking: Reported on 08/28/2021 05/07/21   Barb Merino, MD  ?methylPREDNISolone (MEDROL DOSEPAK) 4 MG TBPK tablet Taper over 6 days. ?Patient not taking: Reported on 08/28/2021 08/18/21   Delia Heady, PA-C  ? ? ?Social History  ? ?Socioeconomic History  ? Marital status: Divorced  ?  Spouse name: Not on file  ? Number of children: Not on file  ? Years of education: Not on file  ? Highest education level: Not on file  ?Occupational History  ? Not on file  ?Tobacco Use  ? Smoking status: Former  ?  Packs/day: 1.00  ?  Years: 50.00  ?  Pack years: 50.00  ?  Types: Cigarettes  ?  Quit date: 07/17/2019  ?  Years since quitting: 2.1  ? Smokeless tobacco: Never  ?Vaping Use  ? Vaping Use: Never used  ?Substance and Sexual Activity  ? Alcohol use: Not Currently  ?  Alcohol/week: 1.0 - 3.0 standard drink  ?  Types: 1 - 3 Cans of beer per week  ? Drug use: No  ? Sexual activity: Not Currently  ?Other Topics Concern  ? Not on file  ?Social History Narrative  ? Married  ? Works in Architect  ? ?Social Determinants of Health  ? ?Financial Resource Strain: Not on file  ?Food Insecurity: Not on file  ?Transportation Needs: Not on file  ?Physical Activity: Not on file  ?Stress: Not on file  ?Social Connections: Not on file  ?Intimate Partner Violence: Not on file  ? ? ? ?Family History  ?Problem Relation Age of Onset  ? Hypertension Mother   ? Colon cancer Father   ? Stomach cancer Brother   ? ? ?ROS: Otherwise negative unless mentioned in HPI ? ?Physical Examination ? ?Vitals:  ? 08/28/21 0927  ?BP: 121/77  ?Pulse: 67  ?Resp: 16  ?Temp: 97.7 ?F (36.5 ?C)  ?SpO2: 96%  ? ?There is no height or weight on file to calculate BMI. ? ?General:  WDWN in NAD ?Gait: Not observed ?HENT: WNL, normocephalic ?Pulmonary: normal non-labored  breathing, without Rales, rhonchi,  wheezing ?Cardiac: regular ?Abdomen:  soft, NT/ND, no masses ?Skin: without rashes ?Vascular Exam/Pulses: Palpable right femoral pulse; 1+ left femoral pulse ?Extremities: Right great toe wound pictured below ?Musculoskeletal: no muscle wasting or atrophy  ?Neurologic: A&O X 3;  No focal weakness or paresthesias are detected; speech is fluent/normal ?Psychiatric:  The pt has Normal affect. ?Lymph:  Unremarkable ? ? ? ? ?CBC ?   ?Component Value Date/Time  ? WBC 11.1 (H) 08/28/2021 1126  ? RBC 3.21 (L) 08/28/2021 1126  ? HGB 10.8 (L) 08/28/2021 1126  ? HGB 10.4 (L) 06/18/2021 1431  ? HCT 34.7 (L) 08/28/2021 1126  ? HCT 31.6 (L) 06/18/2021 1431  ? PLT 175 08/28/2021 1126  ? PLT 142 (L) 06/18/2021 1431  ? MCV 108.1 (H) 08/28/2021 1126  ? MCV 96 06/18/2021 1431  ? MCH 33.6 08/28/2021 1126  ? MCHC 31.1 08/28/2021 1126  ? RDW 18.1 (H) 08/28/2021 1126  ? RDW 14.3 06/18/2021 1431  ? LYMPHSABS 2.0 08/28/2021 1126  ? LYMPHSABS 1.5 06/18/2021 1431  ? MONOABS 1.0 08/28/2021 1126  ? EOSABS 0.4 08/28/2021 1126  ? EOSABS 1.0 (H) 06/18/2021 1431  ? BASOSABS 0.1 08/28/2021 1126  ? BASOSABS 0.1 06/18/2021 1431  ? ? ?BMET ?   ?Component Value  Date/Time  ? NA 138 08/28/2021 1126  ? NA 141 06/18/2021 1431  ? K 5.6 (H) 08/28/2021 1126  ? CL 97 (L) 08/28/2021 1126  ? CO2 23 08/28/2021 1126  ? GLUCOSE 73 08/28/2021 1126  ? BUN 59 (H) 08/28/2021 1126  ? BUN 20

## 2021-08-28 NOTE — ED Notes (Signed)
Nephrology paged by [place] @[phone]  ?

## 2021-08-28 NOTE — Hospital Course (Addendum)
#  Foot wound, right great toe ?#Severe peripheral artery disease ?Patient with history of PAD with bilateral iliac artery stenting presented for evaluation of R foot wound and pain 2/2 trauma (dropping a jar of jam on it). Xray of foot at urgent care 3/22 showed soft tissue edema throughout the great toe with small foci of gas subjacent to the nail but no fracture or osteomyelitis. S/p I&D of the wound at the urgent care. His pain was severe and since that time, his toe had turned purple. Of note, patient has a hx of right superficial femoral artery occlusion. On initial exam, his lower extremities were cool to touch with nonpalpable and non-dopplerable pulses. ABIs on admission 0.56 on right with TBI absent. Left ABI 0.58; TBI  0.28. Given ABI findings, patient is at risk for limb loss. Vascular surgery evaluated the patient and RLE angiogram was performed on 3/24, confirming that patient will need RCFA - R popliteal artery bypass. Surgery scheduled for 3/31, however, can be done as an outpatient. Additionally, LDL level was 78 (goal LDL <70 as patient is symptomatic from PAD) and patient was started on aspirin 81 mg daily and lipitor 40 mg daily. Patient remained hemodynamically stable and was stable for discharge with outpatient vascular follow up.  ? ?#ESRD on HD TThSa ?#Hyperkalemia, resolved ?Patient has history of ESRD on HD TThSa. Patient missed HD the day he presented to the emergency department and subsequently, K was elevated at 6.3 prior to HD. He received HD off schedule on Friday and had an improvement in his electrolytes. Also received dialysis on Saturday, 3/25 to get back on his usual schedule.  ? ?#HTN ?Patient's BP was initially elevated to the 170s/90s prior to HD. Patient's home medications are amlodipine 10mg  daily and metoprolol 25mg  BID. BP remained high even after dialysis, however, he was asymptomatic. Stable for discharge and recommend outpatient follow up with PCP regarding adjustment of  BP meds.  ? ?#HFrEF ?LVEF estimated at 35-40% on TEE from 05/13/21. Patient does not report taking any home medications. Patient also was not fluid overloaded on exam. Maintained K>4 and Mg >2.  ? ?#Macrocytic Anemia ?Hgb remained stable at baseline around 10-11. Vit B12, folate wnl.  ?

## 2021-08-28 NOTE — ED Triage Notes (Signed)
Pt here with reports of dropping a jar of jelly onto his R big toe on Saturday. Pt was seen at [facility] and told to follow up here due to concern of infection.  ?

## 2021-08-28 NOTE — ED Provider Notes (Signed)
?[place] ?Provider Note ? ? ?CSN: [id number] ?Arrival date & time: 08/28/21  7673 ? ?  ? ?History ? ?Chief Complaint  ?Patient presents with  ? Foot Pain  ? ? ?Albert Harris is a 66 y.o. male. ? ? ?Foot Pain ? ?Patient is a [age] year old male with past medical history significant for HTN, 50 years of tobacco abuse, [name], stroke, HLD, COPD, peripheral vascular disease, claudication CKD 5 on dialysis Tuesday Thursday Saturday most recent dialysis session was Tuesday although he did not obtain the full course because of discomfort with his foot. ? ?Patient here with right great toe and right foot pain. ? ? ?  ? ?Home Medications ?Prior to Admission medications   ?Medication Sig Start Date End Date Taking? Authorizing Provider  ?albuterol (VENTOLIN HFA) 108 (90 Base) MCG/ACT inhaler INHALE 1-2 PUFFS INTO THE LUNGS EVERY 6 (SIX) HOURS AS NEEDED FOR WHEEZING OR SHORTNESS OF BREATH. 07/02/20 10/11/21 Yes Maness, Philip, MD  ?AMLODIPINE BESYLATE PO Take 1 tablet by mouth daily.   Yes [provider]  ?calcium acetate (PHOSLO) 667 MG capsule Take 2 capsule by mouth three times a day with meals ?Patient taking differently: Take 1,334 mg by mouth 3 (three) times daily with meals. 06/17/21  Yes   ?metoprolol tartrate (LOPRESSOR) 25 MG tablet TAKE 1 TABLET BY MOUTH TWICE A DAY ?Patient taking differently: Take 25 mg by mouth 2 (two) times daily. 07/25/20 10/11/21 Yes Corliss Parish, MD  ?famotidine (PEPCID) 20 MG tablet Take 1 tablet (20 mg total) by mouth 2 (two) times daily. ?Patient not taking: Reported on 08/28/2021 05/07/21 06/08/21  Barb Merino, MD  ?hydrOXYzine (ATARAX) 10 MG tablet Take 1 tablet (10 mg total) by mouth 3 (three) times daily as needed. ?Patient not taking: Reported on 08/28/2021 06/18/21   Kerin Perna, NP  ?lidocaine (LIDODERM) 5 % Place 1 patch onto the skin daily. Remove & Discard patch within 12 hours or as directed by MD ?Patient not taking:  Reported on 08/28/2021 05/07/21   Barb Merino, MD  ?methylPREDNISolone (MEDROL DOSEPAK) 4 MG TBPK tablet Taper over 6 days. ?Patient not taking: Reported on 08/28/2021 08/18/21   Delia Heady, PA-C  ?   ? ?Allergies    ?Lidocaine-prilocaine   ? ?Review of Systems   ?Review of Systems ? ?Physical Exam ?Updated Vital Signs ?BP 121/77 (BP Location: Left Arm)   Pulse 67   Temp 97.7 ?F (36.5 ?C) (Oral)   Resp 16   SpO2 96%  ?Physical Exam ?Vitals and nursing note reviewed.  ?Constitutional:   ?   General: He is not in acute distress. ?HENT:  ?   Head: Normocephalic and atraumatic.  ?   Nose: Nose normal.  ?Eyes:  ?   General: No scleral icterus. ?Cardiovascular:  ?   Rate and Rhythm: Normal rate and regular rhythm.  ?   Pulses: Normal pulses.  ?   Heart sounds: Normal heart sounds.  ?   Comments: Difficult to palpate DP PT pulses of right lower extremity unable to obtain pulses on Doppler. ?Pulmonary:  ?   Effort: Pulmonary effort is normal. No respiratory distress.  ?   Breath sounds: No wheezing.  ?Abdominal:  ?   Palpations: Abdomen is soft.  ?   Tenderness: There is no abdominal tenderness.  ?Musculoskeletal:  ?   Cervical back: Normal range of motion.  ?   Right lower leg: No edema.  ?   Left lower leg: No  edema.  ?Skin: ?   General: Skin is warm and dry.  ?   Capillary Refill: Capillary refill takes less than 2 seconds.  ?   Comments: Hematoma around right great toe and of third toe medial side.  See pictures below.  Cap refill approximately 3 seconds.  Sensation intact.  Skin cool to touch.  ?Neurological:  ?   Mental Status: He is alert. Mental status is at baseline.  ?Psychiatric:     ?   Mood and Affect: Mood normal.     ?   Behavior: Behavior normal.  ? ? ? ? ? ? ? ? ?ED Results / Procedures / Treatments   ?Labs ?(all labs ordered are listed, but only abnormal results are displayed) ?Labs Reviewed  ?CBC WITH DIFFERENTIAL/PLATELET - Abnormal; Notable for the following components:  ?    Result Value  ? WBC  11.1 (*)   ? RBC 3.21 (*)   ? Hemoglobin 10.8 (*)   ? HCT 34.7 (*)   ? MCV 108.1 (*)   ? RDW 18.1 (*)   ? All other components within normal limits  ?BASIC METABOLIC PANEL - Abnormal; Notable for the following components:  ? Potassium 5.6 (*)   ? Chloride 97 (*)   ? BUN 59 (*)   ? Creatinine, Ser 9.08 (*)   ? GFR, Estimated 6 (*)   ? Anion gap 18 (*)   ? All other components within normal limits  ?LACTIC ACID, PLASMA - Abnormal; Notable for the following components:  ? Lactic Acid, Venous 2.9 (*)   ? All other components within normal limits  ?PROTIME-INR  ? ? ?EKG ?None ? ?Radiology ?No results found. ? ?Procedures ?Marland KitchenCritical Care ?Performed by: Tedd Sias, PA ?Authorized by: Tedd Sias, PA  ? ?Critical care provider statement:  ?  Critical care time (minutes):  35 ?  Critical care time was exclusive of:  Separately billable procedures and treating other patients and teaching time ?  Critical care was necessary to treat or prevent imminent or life-threatening deterioration of the following conditions:  Circulatory failure ?  Critical care was time spent personally by me on the following activities:  Development of treatment plan with patient or surrogate, review of old charts, re-evaluation of patient's condition, pulse oximetry, ordering and review of radiographic studies, ordering and review of laboratory studies, ordering and performing treatments and interventions, obtaining history from patient or surrogate, examination of patient and evaluation of patient's response to treatment ?  Care discussed with: admitting provider    ? ? ?Medications Ordered in ED ?Medications  ?oxyCODONE-acetaminophen (PERCOCET/ROXICET) 5-325 MG per tablet 1 tablet (1 tablet Oral Given 08/28/21 1159)  ?sodium zirconium cyclosilicate (LOKELMA) packet 10 g (10 g Oral Given 08/28/21 1323)  ?oxyCODONE-acetaminophen (PERCOCET/ROXICET) 5-325 MG per tablet 1 tablet (1 tablet Oral Given 08/28/21 1323)  ? ? ?ED Course/ Medical  Decision Making/ A&P ?Clinical Course as of 08/28/21 1435  ?Thu Aug 28, 2021  ?1252 Discussed with [place] who will see pt - ordered ABI [WF]  ?  ?Clinical Course User Index ?[WF] Tedd Sias, [place]  ? ?                        ?Medical Decision Making ?Amount and/or Complexity of Data Reviewed ?Labs: ordered. ? ?Risk ?Prescription drug management. ?Decision regarding hospitalization. ? ? ?This patient presents to the ED for concern of toe pain, this involves a number of treatment options,  and is a complaint that carries with it a high risk of complications and morbidity.  The differential diagnosis includes ischemia, trauma, fracture, infection ? ? ?Co morbidities: ?Discussed in HPI ? ? ?Brief History: ? 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 ? ?Patient here with right great toe and right foot pain. ? ? ?Cool right lower extremity with difficult to palpate pulses.  Doppler used still unable to find pulses.  Discussed with vascular surgery who will evaluate. ? ? ? ?EMR reviewed including pt PMHx, past surgical history and past visits to ER.  ? ?See HPI for more details ? ? ?Lab Tests: ? ? ?I ordered and independently interpreted labs. Labs notable for hyperkalemia, uremia, elevated creatinine consistent with overdue for dialysis.  Mild leukocytosis ongoing anemia. ? ? ?Imaging Studies: ? ?ABI pending at time of admission ? ? ? ?Cardiac Monitoring: ? ?NA ?EKG non-ischemic ? ? ?Medicines ordered: ? ?I ordered medication including Percocet x2 Lokelma for pain, hyperkalemia ?Reevaluation of the patient after these medicines showed that the patient improved ?I have reviewed the patients home medicines and have made  adjustments as needed ? ? ?Critical Interventions: ? ?Consultation with vascular surgery for critical limb ischemia ?Discussion with nephrology for hyperkalemia and need for dialysis ? ? ?Consults/Attending Physician ? ?Immedi

## 2021-08-28 NOTE — Plan of Care (Signed)
  Problem: Nutrition: Goal: Adequate nutrition will be maintained Outcome: Progressing   Problem: Safety: Goal: Ability to remain free from injury will improve Outcome: Progressing   
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
## 2021-08-29 ENCOUNTER — Inpatient Hospital Stay (HOSPITAL_COMMUNITY)
Admission: EM | Disposition: A | Discharge status: Home / Self Care | Payer: Self-pay | Source: Home / Self Care | Attending: Internal Medicine

## 2021-08-29 DIAGNOSIS — I70221 Atherosclerosis of native arteries of extremities with rest pain, right leg: Secondary | ICD-10-CM

## 2021-08-29 HISTORY — PX: ABDOMINAL AORTOGRAM W/LOWER EXTREMITY: CATH118223

## 2021-08-29 LAB — CBC
HCT: 34 % — ABNORMAL LOW (ref 39.0–52.0)
HCT: 31.7 % — ABNORMAL LOW (ref 39.0–52.0)
Hemoglobin: 10.5 g/dL — ABNORMAL LOW (ref 13.0–17.0)
Hemoglobin: 10 g/dL — ABNORMAL LOW (ref 13.0–17.0)
MCH: 32.8 pg (ref 26.0–34.0)
MCH: 33.2 pg (ref 26.0–34.0)
MCHC: 30.9 g/dL (ref 30.0–36.0)
MCHC: 31.5 g/dL (ref 30.0–36.0)
MCV: 106.3 fL — ABNORMAL HIGH (ref 80.0–100.0)
MCV: 105.3 fL — ABNORMAL HIGH (ref 80.0–100.0)
Platelets: 145 10*3/uL — ABNORMAL LOW (ref 150–400)
Platelets: 152 10*3/uL (ref 150–400)
RBC: 3.2 MIL/uL — ABNORMAL LOW (ref 4.22–5.81)
RBC: 3.01 MIL/uL — ABNORMAL LOW (ref 4.22–5.81)
RDW: 18.1 % — ABNORMAL HIGH (ref 11.5–15.5)
RDW: 18.1 % — ABNORMAL HIGH (ref 11.5–15.5)
WBC: 11.8 10*3/uL — ABNORMAL HIGH (ref 4.0–10.5)
WBC: 10.2 10*3/uL (ref 4.0–10.5)
nRBC: 0.3 % — ABNORMAL HIGH (ref 0.0–0.2)
nRBC: 0.2 % (ref 0.0–0.2)

## 2021-08-29 LAB — BASIC METABOLIC PANEL
Anion gap: 20 — ABNORMAL HIGH (ref 5–15)
Anion gap: 18 — ABNORMAL HIGH (ref 5–15)
BUN: 71 mg/dL — ABNORMAL HIGH (ref 8–23)
BUN: 77 mg/dL — ABNORMAL HIGH (ref 8–23)
CO2: 22 mmol/L (ref 22–32)
CO2: 22 mmol/L (ref 22–32)
Calcium: 9.4 mg/dL (ref 8.9–10.3)
Calcium: 9.2 mg/dL (ref 8.9–10.3)
Chloride: 95 mmol/L — ABNORMAL LOW (ref 98–111)
Chloride: 97 mmol/L — ABNORMAL LOW (ref 98–111)
Creatinine, Ser: 10.08 mg/dL — ABNORMAL HIGH (ref 0.61–1.24)
Creatinine, Ser: 10.46 mg/dL — ABNORMAL HIGH (ref 0.61–1.24)
GFR, Estimated: 5 mL/min — ABNORMAL LOW (ref >60)
GFR, Estimated: 5 mL/min — ABNORMAL LOW (ref >60)
Glucose, Bld: 94 mg/dL (ref 70–99)
Glucose, Bld: 88 mg/dL (ref 70–99)
Potassium: 6.1 mmol/L — ABNORMAL HIGH (ref 3.5–5.1)
Potassium: 6.3 mmol/L (ref 3.5–5.1)
Sodium: 137 mmol/L (ref 135–145)
Sodium: 137 mmol/L (ref 135–145)

## 2021-08-29 LAB — C-REACTIVE PROTEIN: CRP: 8.3 mg/dL — ABNORMAL HIGH (ref <1.0)

## 2021-08-29 LAB — HEPATITIS B SURFACE ANTIGEN
Hepatitis B Surface Ag: NONREACTIVE
Hepatitis B Surface Ag: NONREACTIVE

## 2021-08-29 LAB — HEMOGLOBIN A1C
Hgb A1c MFr Bld: 5 % (ref 4.8–5.6)
Mean Plasma Glucose: 96.8 mg/dL

## 2021-08-29 LAB — SEDIMENTATION RATE: Sed Rate: 43 mm/hr — ABNORMAL HIGH (ref 0–16)

## 2021-08-29 LAB — FOLATE: Folate: 18.7 ng/mL (ref >5.9)

## 2021-08-29 LAB — CREATININE, SERUM
Creatinine, Ser: 6.33 mg/dL — ABNORMAL HIGH (ref 0.61–1.24)
GFR, Estimated: 9 mL/min — ABNORMAL LOW (ref >60)

## 2021-08-29 LAB — HEPATITIS B SURFACE ANTIBODY,QUALITATIVE: Hep B S Ab: REACTIVE — AB

## 2021-08-29 LAB — MAGNESIUM: Magnesium: 2.9 mg/dL — ABNORMAL HIGH (ref 1.7–2.4)

## 2021-08-29 LAB — VITAMIN B12: Vitamin B-12: 520 pg/mL (ref 180–914)

## 2021-08-29 IMAGING — CR DG CHEST 1V PORT
1 series · 1 of 1 positions shown · non-contrast
Comparison: 06/22/2020

CLINICAL DATA: Post dialysis catheter placement.

EXAM:
PORTABLE CHEST 1 VIEW

[AP]
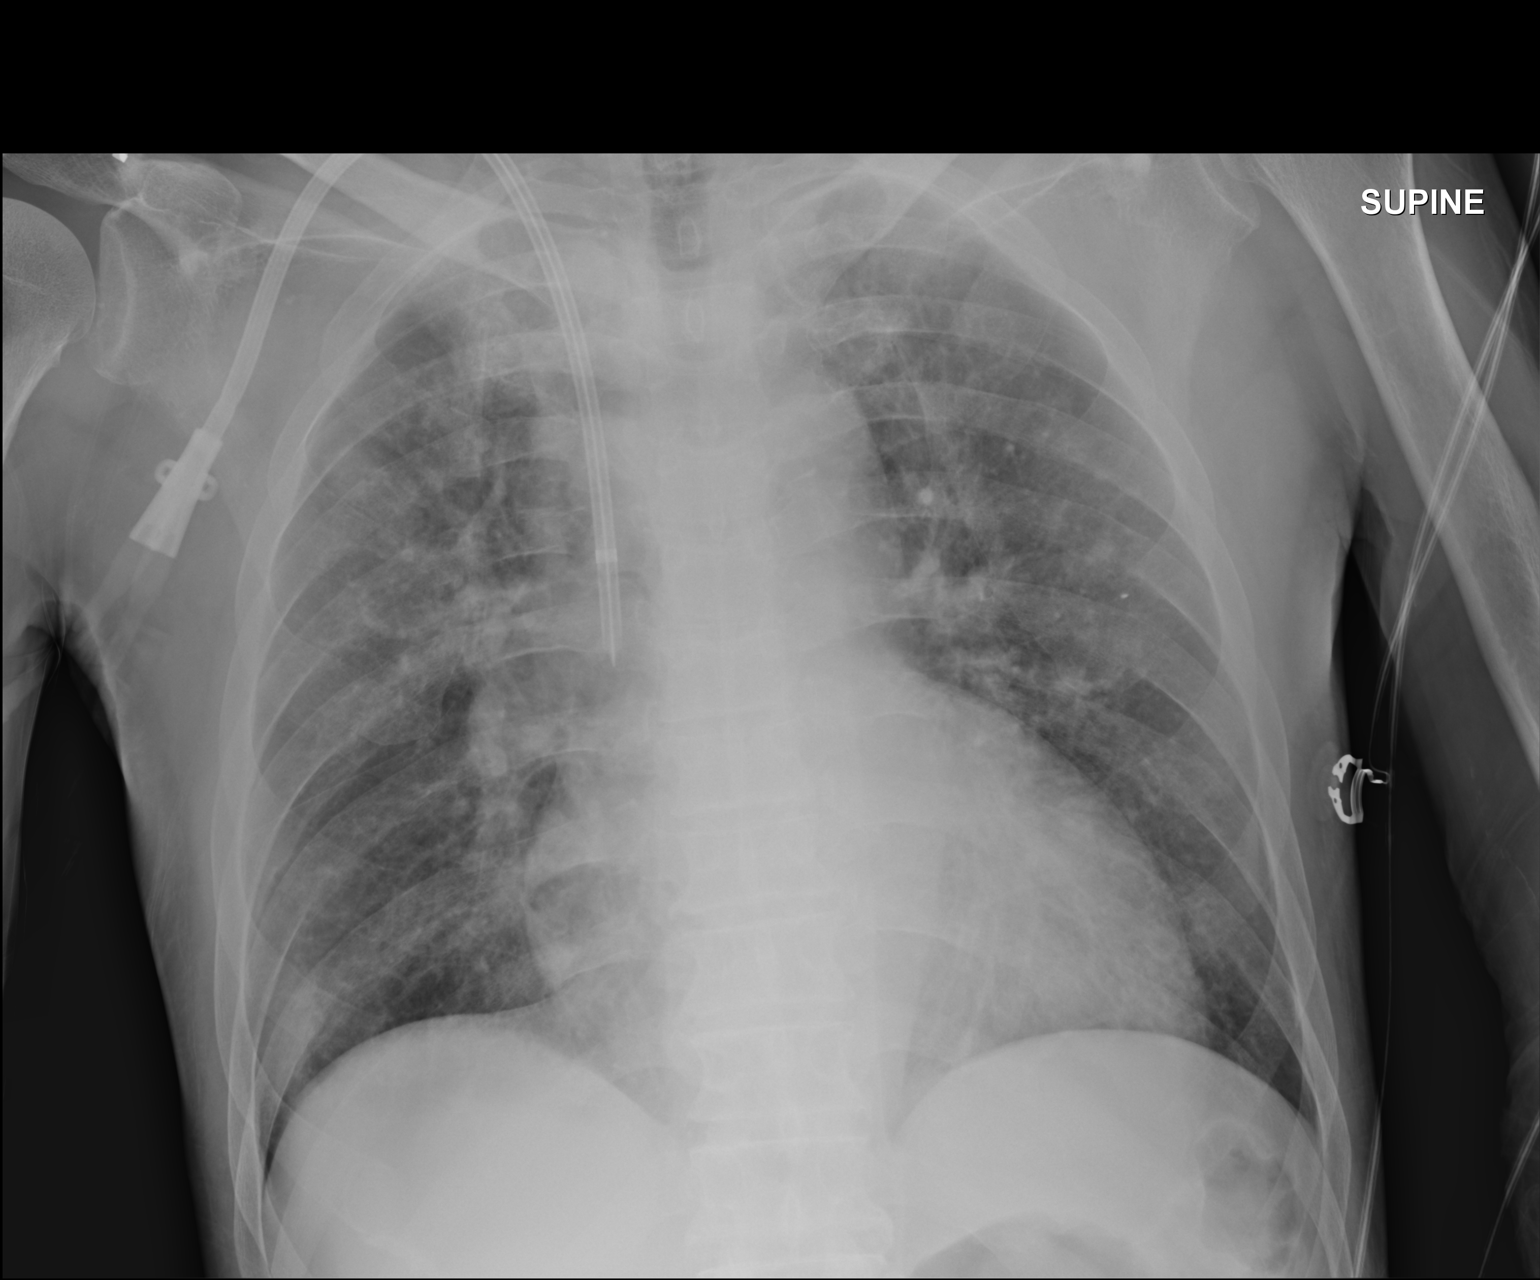

[1 of 1 positions shown; findings below may reference images not displayed]

FINDINGS: Grossly unchanged enlarged cardiac silhouette and mediastinal
contours. Redemonstrated diffuse slightly nodular interstitial
airspace opacities with perihilar and upper lung predominance.
Minimally improved aeration of the bilateral lung bases, left
greater than right. No pleural effusion or pneumothorax. No new
focal airspace opacities. No pleural effusion.

Interval placement of right jugular approach dialysis catheter with
tips overlying the mid SVC. No pneumothorax.

No acute osseous abnormalities.
IMPRESSION: 1. Interval placement of right jugular approach dialysis catheter
with tips overlying the mid SVC. No pneumothorax.
2. Minimally improved aeration of the lung bases, left greater than
right, otherwise, similar findings compatible with provided history
of COVID 19 pneumonia.

## 2021-08-29 SURGERY — ABDOMINAL AORTOGRAM W/LOWER EXTREMITY
Anesthesia: LOCAL | Laterality: Right

## 2021-08-29 MED ORDER — FENTANYL CITRATE (PF) 100 MCG/2ML IJ SOLN
INTRAMUSCULAR | Status: AC
  Filled 2021-08-29: qty 2

## 2021-08-29 MED ORDER — SODIUM CHLORIDE 0.9 % IV SOLN: 250.0000 mL | INTRAVENOUS | Status: DC | PRN

## 2021-08-29 MED ORDER — FENTANYL CITRATE (PF) 100 MCG/2ML IJ SOLN
INTRAMUSCULAR | Status: DC | PRN
  Administered 2021-08-29: 50 ug via INTRAVENOUS

## 2021-08-29 MED ORDER — HEPARIN (PORCINE) IN NACL 1000-0.9 UT/500ML-% IV SOLN
INTRAVENOUS | Status: DC | PRN
Start: 2021-08-29 — End: 2021-08-29
  Administered 2021-08-29 (×2): 500 mL

## 2021-08-29 MED ORDER — SODIUM CHLORIDE 0.9 % IV SOLN
INTRAVENOUS | Status: DC
Start: 2021-08-29 — End: 2021-08-30

## 2021-08-29 MED ORDER — HEPARIN (PORCINE) IN NACL 1000-0.9 UT/500ML-% IV SOLN
INTRAVENOUS | Status: AC
  Filled 2021-08-29: qty 500

## 2021-08-29 MED ORDER — TETRACAINE HCL 1 % IJ SOLN
20.0000 mg | Freq: Once | INTRAMUSCULAR | Status: AC
  Administered 2021-08-29: 2 mL via INTRADERMAL
  Filled 2021-08-29: qty 2

## 2021-08-29 MED ORDER — HYDRALAZINE HCL 20 MG/ML IJ SOLN: 5.0000 mg | INTRAMUSCULAR | Status: DC | PRN

## 2021-08-29 MED ORDER — LABETALOL HCL 5 MG/ML IV SOLN: 10.0000 mg | INTRAVENOUS | Status: DC | PRN

## 2021-08-29 MED ORDER — HEPARIN SODIUM (PORCINE) 5000 UNIT/ML IJ SOLN
5000.0000 [IU] | Freq: Three times a day (TID) | INTRAMUSCULAR | Status: DC
  Administered 2021-08-29 – 2021-08-30 (×2): 5000 [IU] via SUBCUTANEOUS
  Filled 2021-08-29 (×3): qty 1

## 2021-08-29 MED ORDER — CHLORHEXIDINE GLUCONATE CLOTH 2 % EX PADS
6.0000 | MEDICATED_PAD | Freq: Every day | CUTANEOUS | Status: DC
  Administered 2021-08-30: 6 via TOPICAL

## 2021-08-29 MED ORDER — MIDAZOLAM HCL 2 MG/2ML IJ SOLN
INTRAMUSCULAR | Status: DC | PRN
  Administered 2021-08-29: 1 mg via INTRAVENOUS

## 2021-08-29 MED ORDER — ONDANSETRON HCL 4 MG/2ML IJ SOLN: 4.0000 mg | Freq: Four times a day (QID) | INTRAMUSCULAR | Status: DC | PRN

## 2021-08-29 MED ORDER — ATORVASTATIN CALCIUM 40 MG PO TABS
40.0000 mg | ORAL_TABLET | Freq: Every day | ORAL | Status: DC
  Administered 2021-08-29 – 2021-08-30 (×2): 40 mg via ORAL
  Filled 2021-08-29 (×2): qty 1

## 2021-08-29 MED ORDER — IODIXANOL 320 MG/ML IV SOLN
INTRAVENOUS | Status: DC | PRN
  Administered 2021-08-29: 65 mL

## 2021-08-29 MED ORDER — SODIUM CHLORIDE 0.9% FLUSH
3.0000 mL | Freq: Two times a day (BID) | INTRAVENOUS | Status: DC
  Administered 2021-08-29: 3 mL via INTRAVENOUS

## 2021-08-29 MED ORDER — SODIUM CHLORIDE 0.9% FLUSH: 3.0000 mL | INTRAVENOUS | Status: DC | PRN

## 2021-08-29 MED ORDER — LIDOCAINE HCL (PF) 1 % IJ SOLN
INTRAMUSCULAR | Status: AC
  Filled 2021-08-29: qty 30

## 2021-08-29 MED ORDER — ACETAMINOPHEN 325 MG PO TABS: 650.0000 mg | ORAL_TABLET | ORAL | Status: DC | PRN

## 2021-08-29 MED ORDER — ASPIRIN EC 81 MG PO TBEC
81.0000 mg | DELAYED_RELEASE_TABLET | Freq: Every day | ORAL | Status: DC
  Administered 2021-08-29 – 2021-08-30 (×2): 81 mg via ORAL
  Filled 2021-08-29 (×2): qty 1

## 2021-08-29 MED ORDER — MIDAZOLAM HCL 2 MG/2ML IJ SOLN
INTRAMUSCULAR | Status: AC
  Filled 2021-08-29: qty 2

## 2021-08-29 MED ORDER — HYDROMORPHONE HCL 1 MG/ML IJ SOLN
INTRAMUSCULAR | Status: AC
  Filled 2021-08-29: qty 0.5

## 2021-08-29 SURGICAL SUPPLY — 14 items
CATH NAVICROSS ANG 65CM (CATHETERS) IMPLANT
CATH OMNI FLUSH 5F 65CM (CATHETERS) ×1 IMPLANT
CATHETER NAVICROSS ANG 65CM (CATHETERS) ×2
DEVICE TORQUE H2O (MISCELLANEOUS) ×1 IMPLANT
GLIDEWIRE ANGLED SS 035X260CM (WIRE) ×1 IMPLANT
KIT MICROPUNCTURE NIT STIFF (SHEATH) ×1 IMPLANT
KIT PV (KITS) ×3 IMPLANT
SHEATH PINNACLE 5F 10CM (SHEATH) ×1 IMPLANT
SHEATH PROBE COVER 6X72 (BAG) ×1 IMPLANT
SYR MEDRAD MARK 7 150ML (SYRINGE) ×3 IMPLANT
TRANSDUCER W/STOPCOCK (MISCELLANEOUS) ×3 IMPLANT
TRAY PV CATH (CUSTOM PROCEDURE TRAY) ×3 IMPLANT
WIRE BENTSON .035X145CM (WIRE) ×1 IMPLANT
WIRE TORQFLEX AUST .018X40CM (WIRE) ×1 IMPLANT

## 2021-08-29 NOTE — Consult Note (Signed)
Renal Service ?Consult Note ?[facility] ? ?[place] ?08/29/2021 ?Sol Blazing, MD ?Requesting Physician: Dr. Heber  ? ?Reason for Consult: ESRD pt w/ R foot wound ?HPI: The patient is a 66 y.o. year-old w/ hx of ESRD on HD, COPD, HL, HTN, hx CVA who presented to ED w/ R foot pain and open wound of the great toe. Hx of PAD w/ iliac stenting the past. Has I&D of the wound at urgent care recently. Pt admitted and VVS consulting, for angiogram today most likely. Asked to see for HD.   ? ?Pt seen on HD.  No SOB, cough or CP, no n/v/d, no recent access issues. Pt lives w/ his wife in [place]. Quit tobacco 2 yrs ago and quit etoh 4 yrs ago. Allergy to lidocaine cream.  ? ?ROS - denies CP, no joint pain, no HA, no blurry vision, no rash, no diarrhea, no nausea/ vomiting, no dysuria, no difficulty voiding ? ? ?Past Medical History  ?Past Medical History:  ?Diagnosis Date  ? Anemia   ? Asthma   ? Carotid stenosis 09/12/2019  ? Chronic kidney disease   ? t, th. sat dialysis  ? Claudication in peripheral vascular disease ([place]) 09/12/2019  ? Complication of anesthesia   ? " i HAVE A HARD TIME WAKING UP " (only one time)  ? COPD (chronic obstructive pulmonary disease) ([place])   ? COVID-19   ? positive on  06/16/20  ? GERD (gastroesophageal reflux disease)   ? HLD (hyperlipidemia)   ? Hypertension   ? Peripheral vascular disease ([place])   ? PFO with atrial septal aneurysm   ? by echo 11/2017  ? Pneumonia   ? Stroke [facility])   ? [name] (transient ischemic attack) 11/2017  ? Tobacco abuse   ? quit 07/17/19  ? ?Past Surgical History  ?Past Surgical History:  ?Procedure Laterality Date  ? ABDOMINAL AORTOGRAM W/LOWER EXTREMITY N/A 06/21/2020  ? Procedure: ABDOMINAL AORTOGRAM W/LOWER EXTREMITY;  Surgeon: Angelia Mould, MD;  Location: [place] [facility];  Service: Cardiovascular;  Laterality: N/A;  ? AV FISTULA PLACEMENT Left 03/28/2020  ? Procedure: ARTERIOVENOUS (AV) FISTULA CREATION LEFT;  Surgeon: [name], MD;   Location: [place];  Service: Vascular;  Laterality: Left;  ? AV FISTULA PLACEMENT Right 09/23/2020  ? Procedure: RIGHT Arm ARTERIOVENOUS GRAFT CREATION.;  Surgeon: Angelia Mould, MD;  Location: [facility] OR;  Service: Vascular;  Laterality: Right;  ? COLONOSCOPY    ? COLONOSCOPY WITH PROPOFOL N/A 06/27/2020  ? Procedure: COLONOSCOPY WITH PROPOFOL;  Surgeon: Carol Ada, MD;  Location: [place];  Service: Endoscopy;  Laterality: N/A;  ? FINGER SURGERY    ? HEMOSTASIS CLIP PLACEMENT  06/27/2020  ? Procedure: HEMOSTASIS CLIP PLACEMENT;  Surgeon: Carol Ada, MD;  Location: [place];  Service: Endoscopy;;  ? INSERTION OF DIALYSIS CATHETER Right 06/24/2020  ? Procedure: INSERTION OF TUNNELED  DIALYSIS CATHETER; Removal of temporary dialysis catheter right neck;  Surgeon: Angelia Mould, MD;  Location: [facility] OR;  Service: Vascular;  Laterality: Right;  ? LIGATION OF ARTERIOVENOUS  FISTULA Left 07/24/2020  ? Procedure: LIGATION OF LEFT BRACHIOCEPHALIC ARTERIOVENOUS  FISTULA;  Surgeon: [name], MD;  Location: [place];  Service: Vascular;  Laterality: Left;  ? PERIPHERAL VASCULAR INTERVENTION Right 06/21/2020  ? Procedure: PERIPHERAL VASCULAR INTERVENTION;  Surgeon: Angelia Mould, MD;  Location: [place] [facility];  Service: Cardiovascular;  Laterality: Right;  ? TEE WITHOUT CARDIOVERSION N/A 05/13/2021  ? Procedure: TRANSESOPHAGEAL ECHOCARDIOGRAM (  TEE);  Surgeon: Donato Heinz, MD;  Location: [place];  Service: Cardiovascular;  Laterality: N/A;  ? ?Family History  ?Family History  ?Problem Relation Age of Onset  ? Hypertension Mother   ? Colon cancer Father   ? Stomach cancer Brother   ? ?Social History  reports that he quit smoking about 2 years ago. His smoking use included cigarettes. He has a 50.00 pack-year smoking history. He has never used smokeless tobacco. He reports that he does not currently use alcohol after a past usage of about 1.0 - 3.0 standard drink per week. He  reports that he does not use drugs. ?Allergies  ?Allergies  ?Allergen Reactions  ? Lidocaine-Prilocaine Rash  ?  Caused red marks up and down arm  ? ?Home medications ?Prior to Admission medications   ?Medication Sig Start Date End Date Taking? Authorizing Provider  ?albuterol (VENTOLIN HFA) 108 (90 Base) MCG/ACT inhaler INHALE 1-2 PUFFS INTO THE LUNGS EVERY 6 (SIX) HOURS AS NEEDED FOR WHEEZING OR SHORTNESS OF BREATH. 07/02/20 10/11/21 Yes Maness, Philip, MD  ?AMLODIPINE BESYLATE PO Take 1 tablet by mouth daily.   Yes [provider]  ?calcium acetate (PHOSLO) 667 MG capsule Take 2 capsule by mouth three times a day with meals ?Patient taking differently: Take 1,334 mg by mouth 3 (three) times daily with meals. 06/17/21  Yes   ?metoprolol tartrate (LOPRESSOR) 25 MG tablet TAKE 1 TABLET BY MOUTH TWICE A DAY ?Patient taking differently: Take 25 mg by mouth 2 (two) times daily. 07/25/20 10/11/21 Yes Corliss Parish, MD  ?famotidine (PEPCID) 20 MG tablet Take 1 tablet (20 mg total) by mouth 2 (two) times daily. ?Patient not taking: Reported on 08/28/2021 05/07/21 06/08/21  Barb Merino, MD  ?hydrOXYzine (ATARAX) 10 MG tablet Take 1 tablet (10 mg total) by mouth 3 (three) times daily as needed. ?Patient not taking: Reported on 08/28/2021 06/18/21   Kerin Perna, NP  ?lidocaine (LIDODERM) 5 % Place 1 patch onto the skin daily. Remove & Discard patch within 12 hours or as directed by MD ?Patient not taking: Reported on 08/28/2021 05/07/21   Barb Merino, MD  ?methylPREDNISolone (MEDROL DOSEPAK) 4 MG TBPK tablet Taper over 6 days. ?Patient not taking: Reported on 08/28/2021 08/18/21   Delia Heady, PA-C  ? ? ? ?Vitals:  ? 08/29/21 1415 08/29/21 1430 08/29/21 1445 08/29/21 1510  ?BP: 134/64 (!) 141/60 (!) 170/80 (!) 166/90  ?Pulse: 66 66 70 77  ?Resp: 15 13 16  (!) 23  ?Temp:      ?TempSrc:      ?SpO2: 91% 93% 93% 99%  ?Weight:      ? ?Exam ?Gen alert, no distress ?No rash, cyanosis or gangrene ?Sclera anicteric,  throat clear  ?No jvd or bruits ?Chest clear bilat to bases, no rales/ wheezing ?RRR no MRG ?Abd soft ntnd no mass or ascites +bs ?MS no joint effusions or deformity ?Ext no LE or UE edema, R foot wrapped ?Neuro is alert, Ox 3 , nf ?   AVG +bruit ? ? ? ? ? Home meds include - norvasc, phoslo 2 ac, lopressor 25 bid, pepcid, prns/vits ? ? ? ? OP HD: TTS AF ? 4h  350 /800  73kg 2/2 bath  P2  AVG  Hep none ? - mircera 75 ug q2, last 3/16, due 3/30 ? - hectorol 2 ug tiw  ? - 3/24 hep B Ag neg, and hep B Ab's were high/ protective ?  ? ? ?Assessment/ Plan: ?R foot  wound - possible ischemia, VVS planning arteriogram today after HD.  ?ESRD - on HD TTS. HD off schedule this am. Plan HD tomorrow also to get back on schedule.  ?Hyperkalemia - K= 6.1, HD today and tomorrow.  ?HTN - cont home meds ?Vol - below / under dry. UF 1 L w/ HD tomorrow.  ?Anemia esrd - Hb 10, no esa needs right now.  ?MBD ckd - CCa high, hold vdra. Cont binder, add on phos.  ?HFrEF - LVEF 35-50% ?  ? ? ? ?Kelly Splinter  MD ?08/29/2021, 3:40 PM ?Recent Labs  ?Lab 08/28/21 ?1126 08/29/21 ?0017 08/29/21 ?2182  ?HGB 10.8* 10.5*  --   ?CALCIUM 9.7 9.4 9.2  ?CREATININE 9.08* 10.08* 10.46*  ?K 5.6* 6.1* 6.3*  ? ? ?

## 2021-08-29 NOTE — Op Note (Addendum)
DATE OF SERVICE: 08/29/2021 ? ?PATIENT:  Albert Harris  66 y.o. male ? ?PRE-OPERATIVE DIAGNOSIS:  Atherosclerosis of native arteries of right lower extremity causing ulceration ? ?POST-OPERATIVE DIAGNOSIS:  Same ? ?PROCEDURE:   ?1) US guided left common femoral artery access ?2) Aortogram ?3) Right lower extremity angiogram with second order cannulation (62mL total contrast) ?4) Conscious sedation (41 minutes) ? ?SURGEON:  Yevonne Aline. Stanford Breed, MD ? ?ASSISTANT: none ? ?ANESTHESIA:   local and IV sedation ? ?ESTIMATED BLOOD LOSS: minimal ? ?LOCAL MEDICATIONS USED:  TETRACAINE  ? ?COUNTS: confirmed correct. ? ?PATIENT DISPOSITION:  PACU - hemodynamically stable. ?  ?Delay start of Pharmacological VTE agent (>24hrs) due to surgical blood loss or risk of bleeding: no ? ?INDICATION FOR PROCEDURE: Albert Harris is a 66 y.o. male with right lower extremity atherosclerosis with ulceration. After careful discussion of risks, benefits, and alternatives the patient was offered angiogram. The patient  understood and wished to proceed. ? ?OPERATIVE FINDINGS:  ?Terminal aorta and iliac arteries: ?Heavily diseased. ?Prior iliac stenting is widely patent. ? ?Right lower extremity: ?Common femoral artery: heavily diseased; ~50% stenosis  ?Profunda femoris artery: heavily diseased; primary and secondary branches heavily diseased.  ?Superficial femoral artery: occluded ?Popliteal artery: reconstitution above the knee; behind the knee and below knee popliteal widely patent and relatively spared of heavily calcification ?Anterior tibial artery: heavily diseased, but patent to the ankle ?Tibioperoneal trunk: heavily diseased but patent ?Peroneal artery: heavily diseased, but patent to the ankle ?Posterior tibial artery: heavily diseased, but patent to the ankle ?Pedal circulation: poorly visualized, but appears to fill via tibials ?  ?WIfI score calculated based on clinical exam and non-invasive measurements. Clinical stage III. Moderate  amputation risk. High potential benefit from revascularization. ? ?DESCRIPTION OF PROCEDURE: After identification of the patient in the pre-operative holding area, the patient was transferred to the operating room. The patient was positioned supine on the operating room table. Anesthesia was induced. The groins was prepped and draped in standard fashion. A surgical pause was performed confirming correct patient, procedure, and operative location. ? ?The left groin was anesthetized with subcutaneous injection of 1% lidocaine. Using ultrasound guidance, the left common femoral artery was accessed with micropuncture technique. Fluoroscopy was used to confirm cannulation over the femoral head. The 644F sheath was upsized to 44F.  ? ?A Benson wire was advanced into the distal aorta. Over the wire an omni flush catheter was advanced to the level of L2. Aortogram was performed - see above for details.  ? ?The right common iliac artery was selected with a  guidewire. The wire was advanced into the common femoral artery. Over the wire the omni flush catheter was advanced into the external iliac artery. Selective angiography was performed - see above for details.  ? ?The sheath was left in place to be removed in the recovery area. ? ?Conscious sedation was administered with the use of IV fentanyl and midazolam under continuous physician and nurse monitoring.  Heart rate, blood pressure, and oxygen saturation were continuously monitored.  Total sedation time was 41 minutes ? ?Upon completion of the case instrument and sharps counts were confirmed correct. The patient was transferred to the PACU in good condition. I was present for all portions of the procedure. ? ?PLAN: ASA 81mg  PO QD. High intensity statin therapy. Needs RCFA - R popliteal artery bypass. Will look for time next week.  ? ?Yevonne Aline. Stanford Breed, MD ?[facility] ?Office Phone Number: [phone] ?  08/29/2021 12:57 PM ? ?

## 2021-08-29 NOTE — Progress Notes (Signed)
Pt transported to Dialysis ?

## 2021-08-29 NOTE — Progress Notes (Addendum)
? LOS: 1 day  ?Patient Summary: Albert Harris is a 66 y.o. male with a past medical history of PAD, HTN, HLD, stroke, ESRD on HD TTSa, and COPD who presented to the ED with right foot pain and admitted for open wound of the right great toe. ? ?Overnight Events: no acute overnight events ? ?Interim History: Patient missed dialysis on Thursday. ED attempted to page Nephrology yesterday on admission, but was unsuccessful. We called Nephrology this morning and patient will get dialysis this AM. Patient was seen and evaluated by team on round this morning during dialysis. He reports that his pain is 7-8/10 and that he last received pain medication last night. He reports that his pain is confined to his right toes and that he no longer has pain extending to his mid-shin. ? ?Objective: ?Vital signs in last 24 hours: ?Vitals:  ? 08/28/21 1509 08/28/21 1800 08/28/21 2040 08/29/21 0151  ?BP: (!) 172/90 (!) 170/95 (!) 157/86 (!) 147/84  ?Pulse: 65 67 69 60  ?Resp: $Remov'18 17 16 16  'kXszYx$ ?Temp:  97.9 ?F (36.6 ?C) (!) 97.5 ?F (36.4 ?C) (!) 97.3 ?F (36.3 ?C)  ?TempSrc:  Oral Oral Oral  ?SpO2: 99% 98% 100% 99%  ? ?SpO2: 99 % ?Weight change:  ?No intake or output data in the 24 hours ending 08/29/21 0743 ?General: Pleasant, well-appearing man laying in bed during HD. No acute distress. ?Head: Normocephalic. Atraumatic. ?CV: RRR. Normal S1 and S2. No m/r/g. No LE edema ?Pulmonary: Normal work of breathing. Faint crackles heard on left. Mild expiratory wheezes. ?Abdominal: Soft, nontender, nondistended. Normoactive bowel sounds. ?Extremities: Normal ROM. Pedal pulses non-palpable. Lower extremities cool, but warmer than yesterday. Hematoma on dorsal right great toe and medial aspect of third toe. Surrounding erythema has subsided. ?Skin: Cool and dry. Ecchymoses present on upper extremities. ?Neuro: A&Ox3. No focal deficts ?Psych: Normal mood and affect ? ?Lab Results: ? ?  Latest Ref Rng & Units 08/29/2021  ? 12:17 AM 08/28/2021  ? 11:26 AM  08/07/2021  ?  5:18 PM  ?CMP  ?Glucose 70 - 99 mg/dL 94   73   86    ?BUN 8 - 23 mg/dL 71   59   42    ?Creatinine 0.61 - 1.24 mg/dL 10.08   9.08   7.40    ?Sodium 135 - 145 mmol/L 137   138   139    ?Potassium 3.5 - 5.1 mmol/L 6.1   5.6   3.8    ?Chloride 98 - 111 mmol/L 95   97   95    ?CO2 22 - 32 mmol/L 22   23     ?Calcium 8.9 - 10.3 mg/dL 9.4   9.7     ? ? ?  Latest Ref Rng & Units 08/29/2021  ? 12:17 AM 08/28/2021  ? 11:26 AM 08/07/2021  ?  5:18 PM  ?CBC  ?WBC 4.0 - 10.5 K/uL 11.8   11.1     ?Hemoglobin 13.0 - 17.0 g/dL 10.5   10.8   11.6    ?Hematocrit 39.0 - 52.0 % 34.0   34.7   34.0    ?Platelets 150 - 400 K/uL 145   175     ?HgbA1c 5.0 ?Vit B12 520 wnl ?Folate 18.7 wnl ?Mg 2.9 ?CRP 8.3 ?ESR 43 ? ?Medications: ?Scheduled Meds: ? amLODipine  10 mg Oral Daily  ? Chlorhexidine Gluconate Cloth  6 each Topical Q0600  ? heparin  5,000 Units Subcutaneous Q8H  ?  metoprolol tartrate  25 mg Oral BID  ? ?Continuous Infusions: ? sodium chloride 10 mL/hr at 08/29/21 4917  ? ?PRN Meds:.acetaminophen **OR** acetaminophen, albuterol, bisacodyl, HYDROmorphone (DILAUDID) injection, polyethylene glycol ?Assessment/Plan: ?Principal Problem: ?  Open wound of right great toe ?Active Problems: ?  Macrocytic anemia ?  Claudication in peripheral vascular disease ([place]) ?  ESRD (end stage renal disease) ([place]) ?  Hypertension ?  Hyperkalemia ? ?#Foot wound ?#PAD ?Patient with history of PAD with bilateral iliac artery stenting presented for evaluation of R foot wound and pain 2/2 trauma. Xray of foot at urgent care 3/22 showed soft tissue edema throughout the great toe with small foci of gas subjacent to the nail but no fracture or osteomyelitis. S/p I&D of the wound at the urgent care. Patient has a hx of right superficial femoral artery occlusion. ABI today 0.56 on right with TBI absent. Left ABI 0.58; TBI  0.28. At risk for limb loss. LE cool to touch with nonpalpable and non-dopplerable pulses. Vascular surgery saw patient and is  planning on angiography with possible intervention. Patient hemodynamically stable, afebrile with WBC of 11.8. A1c is 5.0.  ?--VVS following ?            - angiography today ?--Tylenol 650mg  PRN for pain ?--Dilaudid 0.5mg  Q4H PRN for pain ?  ?#ESRD on HD TThSa ?#Hyperkalemia ?Patient has history of ESRD on HD TThSa. Patient missed HD yesterday, getting HD this AM. K+ 6.3 prior to HD today up from 5.6 after receiving Lokelma yesterday. ?--Nephrology consulted, appreciate their assistance ?--Avoid nephrotoxic medications ?--Dose medications renally ?--Daily BMP ?--Renal diet w/ fluid restriction ?  ?#HTN ?Patient's BP in 170s/90s prior to HD today. Patient's home medications are amlodipine 10mg  daily and metoprolol 25mg  BID. ?--Amlodipine 10mg  daily ?--Metoprolol 25mg  BID ?  ?#HFrEF ?LVEF estimated at 35-40% on TEE done 05/13/21. Patient does not report taking any home medications. Patient not fluid overloaded on exam.  ?--Strict I&O, daily weights ?--Keep Mg >2.0, K+ 4.0 ?  ?#Macrocytic Anemia ?Hgb 10.5 today, stable at baseline. Vit B12, folate wnl. Will continue to monitor and trend CBC. ?--Daily CBC ?--Peripheral smear ?  ?Diet: Renal w/ fluid restriction, NPO after midnight ?VTE: Heparin [place] ?IVF: None ?Code: FULL ? ?Prior to Admission Living Arrangement: home ?Anticipated Discharge Location: home ?Barriers to Discharge: medical stability ?Dispo: Anticipated discharge in approximately 2 day(s).  ? ?This is a Careers information officer Note.  The care of the patient was discussed with Dr. Heber  and the assessment and plan formulated with their assistance.  Please see their attached note for official documentation of the daily encounter. ? ?Nelva Nay, Medical Student ?08/29/2021, 7:43 AM ?Pager: [phone] ?After 5pm on weekdays and 1pm on weekends: On Call pager [phone] ?  ?

## 2021-08-29 NOTE — Procedures (Signed)
? ?  I was present at this dialysis session, have reviewed the session itself and made  appropriate changes ?Kelly Splinter MD ?[employer] ?pager [phone]   ?08/29/2021, 4:01 PM ? ? ?

## 2021-08-29 NOTE — Progress Notes (Signed)
[facility] [place] ?PROGRESS NOTE ? ?ASSESSMENT / PLAN: ?Albert Harris is a 66 y.o. male with atherosclerosis of native arteries of right lower extremity causing ulceration. patients with chronic limb threatening ischemia have an annual risk of cardiovascular mortality of 25% and a high risk of amputation.  ? ?WIfI score calculated based on clinical exam and non-invasive measurements. Clinical stage III. Moderate amputation risk. High potential benefit from revascularization. ? ?Recommend the following which can slow the progression of atherosclerosis and reduce the risk of major adverse cardiac / limb events:  ?Complete cessation from all tobacco products. ?Blood glucose control with goal A1c < 7%. ?Blood pressure control with goal blood pressure < 140/90 mmHg. ?Lipid reduction therapy with goal LDL-C <100 mg/dL (<70 if symptomatic from PAD).  ?Aspirin 81mg  PO QD.  ?Atorvastatin 40-80mg  PO QD (or other "high intensity" statin therapy). ? ?Plan right lower extremity angiogram with possible intervention via left common femoral approach in cath lab today.  ? ?SUBJECTIVE: ?No interval changes.  ? ?OBJECTIVE: ?BP (!) 186/101   Pulse 67   Temp 97.9 ?F (36.6 ?C) (Oral)   Resp 16   Wt 74.5 kg   SpO2 99%   BMI 24.97 kg/m?  ? ?No acute distress ?Regular rate and rhythm ?Unlabored breathing ?Unchanged appearance in feet ?Doppler flow in both feet ? ? ?  Latest Ref Rng & Units 08/29/2021  ? 12:17 AM 08/28/2021  ? 11:26 AM 08/07/2021  ?  5:18 PM  ?CBC  ?WBC 4.0 - 10.5 K/uL 11.8   11.1     ?Hemoglobin 13.0 - 17.0 g/dL 10.5   10.8   11.6    ?Hematocrit 39.0 - 52.0 % 34.0   34.7   34.0    ?Platelets 150 - 400 K/uL 145   175     ?  ? ? ?  Latest Ref Rng & Units 08/29/2021  ?  6:58 AM 08/29/2021  ? 12:17 AM 08/28/2021  ? 11:26 AM  ?CMP  ?Glucose 70 - 99 mg/dL 88   94   73    ?BUN 8 - 23 mg/dL 77   71   59    ?Creatinine 0.61 - 1.24 mg/dL 10.46   10.08   9.08    ?Sodium 135 - 145 mmol/L 137   137   138     ?Potassium 3.5 - 5.1 mmol/L 6.3   6.1   5.6    ?Chloride 98 - 111 mmol/L 97   95   97    ?CO2 22 - 32 mmol/L 22   22   23     ?Calcium 8.9 - 10.3 mg/dL 9.2   9.4   9.7    ? ? ?Estimated Creatinine Clearance: 6.6 mL/min (A) (by C-G formula based on SCr of 10.46 mg/dL (H)). ? ? ?Yevonne Aline. Stanford Breed, MD ?[facility] [place] ?Office Phone Number: [phone] ?08/29/2021 11:56 AM ? ? ? ?

## 2021-08-29 NOTE — TOC Progression Note (Signed)
Transition of Care (TOC) - Progression Note  ? ? ?Patient Details  ?Name: Albert Harris ?MRN: [id number] ?Date of Birth: [birth year] ? ?Transition of Care (TOC) CM/SW Contact  ?Marilu Favre, RN ?Phone Number: ?08/29/2021, 11:21 AM ? ?Clinical Narrative:    ? ?Likely bypass surgery early next week. TOC will continue to follow in progression rounds.  ? ?Patient has DME at home. Patient had [facility] 04/2021.  ? ?  ?  ?Transition of Care (TOC) Screening Note ? ? ?Patient Details  ?Name: Albert Harris ?Date of Birth: 1956-04-04 ? ? ? ? ?Transition of Care Department [facility]) has reviewed patient and we will continue to monitor patient advancement through interdisciplinary progression rounds. If new patient transition needs arise, please place a TOC consult. ?  ? ?Expected Discharge Plan and Services ?  ?  ?  ?  ?  ?                ?  ?  ?  ?  ?  ?  ?  ?  ?  ?  ? ? ?Social Determinants of Health (SDOH) Interventions ?  ? ?Readmission Risk Interventions ? ?  05/07/2021  ?  2:27 PM 12/19/2020  ? 11:55 AM  ?Readmission Risk Prevention Plan  ?Transportation Screening Complete Complete  ?PCP or Specialist Appt within 3-5 Days  Complete  ?[place] or Home Care Consult  Complete  ?Social Work Consult for [place] Planning/Counseling  Complete  ?Palliative Care Screening  Complete  ?Medication Review Press photographer) Complete Complete  ?PCP or Specialist appointment within 3-5 days of discharge Complete   ?[place] or Home Care Consult Complete   ?SW Recovery Care/Counseling Consult Patient refused   ?Palliative Care Screening Not Applicable   ?[place] Not Applicable   ? ? ?

## 2021-08-30 DIAGNOSIS — I739 Peripheral vascular disease, unspecified: Secondary | ICD-10-CM

## 2021-08-30 LAB — LIPID PANEL
Cholesterol: 140 mg/dL (ref 0–200)
HDL: 46 mg/dL (ref >40)
LDL Cholesterol: 78 mg/dL (ref 0–99)
Total CHOL/HDL Ratio: 3 RATIO
Triglycerides: 79 mg/dL (ref <150)
VLDL: 16 mg/dL (ref 0–40)

## 2021-08-30 LAB — RENAL FUNCTION PANEL
Albumin: 3.2 g/dL — ABNORMAL LOW (ref 3.5–5.0)
Anion gap: 13 (ref 5–15)
BUN: 40 mg/dL — ABNORMAL HIGH (ref 8–23)
CO2: 26 mmol/L (ref 22–32)
Calcium: 8.9 mg/dL (ref 8.9–10.3)
Chloride: 98 mmol/L (ref 98–111)
Creatinine, Ser: 7.1 mg/dL — ABNORMAL HIGH (ref 0.61–1.24)
GFR, Estimated: 8 mL/min — ABNORMAL LOW (ref >60)
Glucose, Bld: 84 mg/dL (ref 70–99)
Phosphorus: 6.1 mg/dL — ABNORMAL HIGH (ref 2.5–4.6)
Potassium: 4.5 mmol/L (ref 3.5–5.1)
Sodium: 137 mmol/L (ref 135–145)

## 2021-08-30 LAB — RESP PANEL BY RT-PCR (FLU A&B, COVID) ARPGX2
Influenza A by PCR: NEGATIVE
Influenza B by PCR: NEGATIVE
SARS Coronavirus 2 by RT PCR: NEGATIVE

## 2021-08-30 LAB — HEPATITIS B SURFACE ANTIBODY, QUANTITATIVE
Hep B S AB Quant (Post): 42.6 m[IU]/mL (ref >9.9)
Hep B S AB Quant (Post): 39.9 m[IU]/mL (ref >9.9)

## 2021-08-30 LAB — CBC
HCT: 33.1 % — ABNORMAL LOW (ref 39.0–52.0)
Hemoglobin: 10.6 g/dL — ABNORMAL LOW (ref 13.0–17.0)
MCH: 34.1 pg — ABNORMAL HIGH (ref 26.0–34.0)
MCHC: 32 g/dL (ref 30.0–36.0)
MCV: 106.4 fL — ABNORMAL HIGH (ref 80.0–100.0)
Platelets: 151 10*3/uL (ref 150–400)
RBC: 3.11 MIL/uL — ABNORMAL LOW (ref 4.22–5.81)
RDW: 18.2 % — ABNORMAL HIGH (ref 11.5–15.5)
WBC: 10.1 10*3/uL (ref 4.0–10.5)
nRBC: 0 % (ref 0.0–0.2)

## 2021-08-30 MED ORDER — ATORVASTATIN CALCIUM 40 MG PO TABS: 40.0000 mg | ORAL_TABLET | Freq: Every day | ORAL | 5 refills | Status: DC

## 2021-08-30 MED ORDER — AMOXICILLIN-POT CLAVULANATE 500-125 MG PO TABS: ORAL_TABLET | ORAL | 0 refills | Status: DC

## 2021-08-30 MED ORDER — ASPIRIN 81 MG PO TBEC: 81.0000 mg | DELAYED_RELEASE_TABLET | Freq: Every day | ORAL | 5 refills | Status: DC

## 2021-08-30 MED ORDER — AMLODIPINE BESYLATE 10 MG PO TABS: 10.0000 mg | ORAL_TABLET | Freq: Every day | ORAL | 0 refills | Status: DC

## 2021-08-30 MED ORDER — HYDROMORPHONE HCL 2 MG PO TABS: 1.0000 mg | ORAL_TABLET | Freq: Two times a day (BID) | ORAL | 0 refills | Status: DC | PRN
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
## 2021-08-30 NOTE — Discharge Instructions (Signed)
Mr. Rue ?It was a pleasure taking care of you at [facility] were admitted for R toe wound and pain. An aortogram of your arteries showed that you are going to need a bypass surgery by vascular to help increase blood flow to your legs. They will call you Monday to schedule the appointment. We are discharging you home now that you are doing better. Please follow the following instructions.  ?1) Take Augmentin 500-125 mg 1 tablet daily, make sure to take it after your dialysis sessions on Tuesday and Thursday next week ?2) Start taking Aspirin 81 mg daily and Lipitor 40 mg daily ?3) Take dilaudid 1 mg (half a tablet) every 12 hrs as needed for pain ?4) Follow up with your primary care doctor and continue your dialysis sessions ? ?Take care,  ?Dr. Linwood Dibbles, MD, MPH ? ?

## 2021-08-30 NOTE — Progress Notes (Signed)
Patient off floor for procedure 

## 2021-08-30 NOTE — Plan of Care (Signed)
?  Problem: Health Behavior/Discharge Planning: ?Goal: Ability to manage health-related needs will improve ?Outcome: Adequate for Discharge ?  ?Problem: Education: ?Goal: Knowledge of General Education information will improve ?Description: Including pain rating scale, medication(s)/side effects and non-pharmacologic comfort measures ?Outcome: Adequate for Discharge ?  ?Problem: Clinical Measurements: ?Goal: Ability to maintain clinical measurements within normal limits will improve ?Outcome: Adequate for Discharge ?Goal: Will remain free from infection ?Outcome: Adequate for Discharge ?Goal: Diagnostic test results will improve ?Outcome: Adequate for Discharge ?Goal: Respiratory complications will improve ?Outcome: Adequate for Discharge ?Goal: Cardiovascular complication will be avoided ?Outcome: Adequate for Discharge ?  ?Problem: Pain Managment: ?Goal: General experience of comfort will improve ?Outcome: Adequate for Discharge ?  ?Problem: Safety: ?Goal: Ability to remain free from injury will improve ?Outcome: Adequate for Discharge ?  ?Problem: Skin Integrity: ?Goal: Risk for impaired skin integrity will decrease ?Outcome: Adequate for Discharge ?  ?

## 2021-08-30 NOTE — Progress Notes (Signed)
Plan for right fem-pop bypass Friday.  ?Can discharge, will coordinate in the outpt setting.  ?My office will reach out to him Monday. ? ?Broadus John MD ?

## 2021-08-30 NOTE — Procedures (Addendum)
? ?  I was present at this dialysis session, have reviewed the session itself and made  appropriate changes ?Kelly Splinter MD ?[employer] ?pager [phone]   ?08/30/2021, 3:54 PM ? ? ?

## 2021-08-30 NOTE — Progress Notes (Signed)
?   08/30/21 1245  ?Vitals  ?Temp 98 ?F (36.7 ?C)  ?Temp Source Oral  ?BP (!) 188/90  ?BP Location Left Arm  ?BP Method Automatic  ?Patient Position (if appropriate) Lying  ?Pulse Rate 78  ?Pulse Rate Source Monitor  ?Oxygen Therapy  ?SpO2 98 %  ?O2 Device Room Air  ?Dialysis Weight  ?Weight 70.6 kg  ?Type of Weight Post-Dialysis  ?Post-Hemodialysis Assessment  ?Rinseback Volume (mL) 250 mL  ?KECN 172 V  ?Dialyzer Clearance Lightly streaked  ?Duration of HD Treatment -hour(s) 3.5 hour(s)  ?Hemodialysis Intake (mL) 500 mL  ?UF Total -Machine (mL) 1500 mL  ?Net UF (mL) 1000 mL  ?Tolerated HD Treatment Yes  ?Post-Hemodialysis Comments tx complete, pt stable  ?AVG/AVF Arterial Site Held (minutes) 5 minutes  ?AVG/AVF Venous Site Held (minutes) 5 minutes  ?Fistula / Graft Right Forearm  ?No placement date or time found.   Placed prior to admission: Yes  Orientation: Right  Access Location: Forearm  ?Site Condition No complications  ?Fistula / Graft Assessment Present;[place];Bruit  ?Status Deaccessed  ? ?HD tx complete, pt stable. No adverse events throughout tx.  ?

## 2021-08-31 ENCOUNTER — Telehealth (HOSPITAL_COMMUNITY): Payer: Self-pay | Admitting: Nephrology

## 2021-08-31 NOTE — Telephone Encounter (Signed)
Transition of care contact from inpatient facility ? ?Date of Discharge: 08/30/21 ?Date of Contact: 08/31/21 - attemtped ?Method of contact: Phone ? ?Attempted to contact patient to discuss transition of care from inpatient admission. Patient did not answer the phone. Message was left on the patient's voicemail with call back number [phone].  ? ?Veneta Penton, PA-C ?[facility] ?Pager [phone] ? ?

## 2021-09-01 ENCOUNTER — Encounter (HOSPITAL_COMMUNITY): Payer: Self-pay | Admitting: Vascular Surgery

## 2021-09-01 ENCOUNTER — Other Ambulatory Visit: Payer: Self-pay

## 2021-09-01 DIAGNOSIS — I70235 Atherosclerosis of native arteries of right leg with ulceration of other part of foot: Secondary | ICD-10-CM

## 2021-09-01 LAB — PATHOLOGIST SMEAR REVIEW

## 2021-09-01 MED FILL — Lidocaine HCl Local Preservative Free (PF) Inj 1%: INTRAMUSCULAR | Qty: 30 | Status: AC

## 2021-09-03 ENCOUNTER — Emergency Department (HOSPITAL_COMMUNITY): Payer: Medicare Other

## 2021-09-03 ENCOUNTER — Emergency Department (HOSPITAL_COMMUNITY)
Admission: EM | Admit: 2021-09-03 | Discharge: 2021-09-03 | Disposition: A | Discharge status: Home / Self Care | Payer: Medicare Other | Source: Home / Self Care | Attending: Emergency Medicine | Admitting: Emergency Medicine

## 2021-09-03 ENCOUNTER — Encounter (HOSPITAL_COMMUNITY): Payer: Self-pay | Admitting: Emergency Medicine

## 2021-09-03 DIAGNOSIS — R1084 Generalized abdominal pain: Secondary | ICD-10-CM | POA: Insufficient documentation

## 2021-09-03 DIAGNOSIS — I70221 Atherosclerosis of native arteries of extremities with rest pain, right leg: Secondary | ICD-10-CM | POA: Diagnosis not present

## 2021-09-03 DIAGNOSIS — N186 End stage renal disease: Secondary | ICD-10-CM | POA: Insufficient documentation

## 2021-09-03 DIAGNOSIS — Z992 Dependence on renal dialysis: Secondary | ICD-10-CM | POA: Insufficient documentation

## 2021-09-03 LAB — COMPREHENSIVE METABOLIC PANEL
ALT: 23 U/L (ref 0–44)
AST: 17 U/L (ref 15–41)
Albumin: 3.3 g/dL — ABNORMAL LOW (ref 3.5–5.0)
Alkaline Phosphatase: 100 U/L (ref 38–126)
Anion gap: 18 — ABNORMAL HIGH (ref 5–15)
BUN: 53 mg/dL — ABNORMAL HIGH (ref 8–23)
CO2: 23 mmol/L (ref 22–32)
Calcium: 9.4 mg/dL (ref 8.9–10.3)
Chloride: 96 mmol/L — ABNORMAL LOW (ref 98–111)
Creatinine, Ser: 11.28 mg/dL — ABNORMAL HIGH (ref 0.61–1.24)
GFR, Estimated: 5 mL/min — ABNORMAL LOW (ref >60)
Glucose, Bld: 104 mg/dL — ABNORMAL HIGH (ref 70–99)
Potassium: 4.3 mmol/L (ref 3.5–5.1)
Sodium: 137 mmol/L (ref 135–145)
Total Bilirubin: 1.5 mg/dL — ABNORMAL HIGH (ref 0.3–1.2)
Total Protein: 7.1 g/dL (ref 6.5–8.1)

## 2021-09-03 LAB — CBC WITH DIFFERENTIAL/PLATELET
Abs Immature Granulocytes: 0.04 10*3/uL (ref 0.00–0.07)
Basophils Absolute: 0.1 10*3/uL (ref 0.0–0.1)
Basophils Relative: 1 %
Eosinophils Absolute: 0.1 10*3/uL (ref 0.0–0.5)
Eosinophils Relative: 1 %
HCT: 33 % — ABNORMAL LOW (ref 39.0–52.0)
Hemoglobin: 10.7 g/dL — ABNORMAL LOW (ref 13.0–17.0)
Immature Granulocytes: 0 %
Lymphocytes Relative: 9 %
Lymphs Abs: 0.9 10*3/uL (ref 0.7–4.0)
MCH: 34.1 pg — ABNORMAL HIGH (ref 26.0–34.0)
MCHC: 32.4 g/dL (ref 30.0–36.0)
MCV: 105.1 fL — ABNORMAL HIGH (ref 80.0–100.0)
Monocytes Absolute: 0.8 10*3/uL (ref 0.1–1.0)
Monocytes Relative: 8 %
Neutro Abs: 8 10*3/uL — ABNORMAL HIGH (ref 1.7–7.7)
Neutrophils Relative %: 81 %
Platelets: 153 10*3/uL (ref 150–400)
RBC: 3.14 MIL/uL — ABNORMAL LOW (ref 4.22–5.81)
RDW: 18.5 % — ABNORMAL HIGH (ref 11.5–15.5)
WBC: 9.9 10*3/uL (ref 4.0–10.5)
nRBC: 0 % (ref 0.0–0.2)

## 2021-09-03 LAB — LIPASE, BLOOD: Lipase: 26 U/L (ref 11–51)

## 2021-09-03 MED ORDER — IOHEXOL 300 MG/ML  SOLN
100.0000 mL | Freq: Once | INTRAMUSCULAR | Status: AC | PRN
  Administered 2021-09-03: 100 mL via INTRAVENOUS

## 2021-09-03 MED ORDER — OXYMETAZOLINE HCL 0.05 % NA SOLN
NASAL | Status: AC
  Filled 2021-09-03: qty 30

## 2021-09-03 MED ORDER — LIDOCAINE-EPINEPHRINE 2 %-1:100000 IJ SOLN
INTRAMUSCULAR | Status: AC
  Filled 2021-09-03: qty 1

## 2021-09-03 MED ORDER — ONDANSETRON 4 MG PO TBDP
4.0000 mg | ORAL_TABLET | Freq: Once | ORAL | Status: AC
  Administered 2021-09-03: 4 mg via ORAL
  Filled 2021-09-03: qty 1

## 2021-09-03 MED ORDER — OXYCODONE HCL 5 MG PO TABS: 5.0000 mg | ORAL_TABLET | Freq: Three times a day (TID) | ORAL | 0 refills | Status: DC | PRN

## 2021-09-03 MED ORDER — FENTANYL CITRATE PF 50 MCG/ML IJ SOSY
50.0000 ug | PREFILLED_SYRINGE | Freq: Once | INTRAMUSCULAR | Status: AC
  Administered 2021-09-03: 50 ug via INTRAVENOUS
  Filled 2021-09-03: qty 1

## 2021-09-03 MED ORDER — HYDROMORPHONE HCL 1 MG/ML IJ SOLN
0.5000 mg | Freq: Once | INTRAMUSCULAR | Status: AC
  Administered 2021-09-03: 0.5 mg via INTRAVENOUS
  Filled 2021-09-03: qty 1

## 2021-09-03 MED ORDER — OXYCODONE HCL 5 MG PO TABS
5.0000 mg | ORAL_TABLET | Freq: Once | ORAL | Status: AC
  Administered 2021-09-03: 5 mg via ORAL
  Filled 2021-09-03: qty 1
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
## 2021-09-03 NOTE — ED Provider Triage Note (Signed)
Emergency Medicine Provider Triage Evaluation Note ? ?Albert Harris , a 66 y.o. male  was evaluated in triage.  Pt complains of left lower abdominal pain.  Hx of ESRD on HD, schedule Tuesday, Thursday, Saturday but missed treatment yesterday due to pain.  No BM in about 5 days.  Wife gave laxative at home without relief.  Recently hospitalized for vascular occlusion to leg, went to OR 08/29/21 with Dr. Stanford Breed.  Was on narcotics for a few days.   ? ?Review of Systems  ?Positive: Abdominal pain ?Negative: fever ? ?Physical Exam  ?BP (!) 163/76 (BP Location: Left Arm)   Pulse 77   Temp 97.9 ?F (36.6 ?C) (Oral)   Resp 18   SpO2 100%  ? ?Gen:   Awake, moaning in pain in triage ?Resp:  Normal effort  ?MSK:   Moves extremities without difficulty  ?Other:  Soft, appears somewhat distended, tender LLQ ? ?Medical Decision Making  ?Medically screening exam initiated at 3:44 AM.  Appropriate orders placed.  KAMRYN GAUTHIER was informed that the remainder of the evaluation will be completed by another provider, this initial triage assessment does not replace that evaluation, and the importance of remaining in the ED until their evaluation is complete. ? ?LLQ abdominal pain.  Missed dialysis yesterday as he was not feeling well.  No BM in the past few days.  Abdomen soft but does appear somewhat distended, tender left lower quadrant.  We will check labs, EKG, CT. ?  ?Larene Pickett, PA-C ?09/03/21 7711 ? ?

## 2021-09-03 NOTE — ED Provider Notes (Signed)
?[facility] ?Provider Note ? ? ?CSN: [id number] ?Arrival date & time: 09/03/21  0351 ? ?  ? ?History ? ?Chief Complaint  ?Patient presents with  ? Abdominal Pain  ? ? ?Albert Harris is a 66 y.o. male. ? ?The history is provided by the patient.  ?Abdominal Pain ?Pain location:  Generalized ?Pain quality: aching   ?Pain radiates to:  Does not radiate ?Pain severity:  Mild ?Onset quality:  Gradual ?Duration:  3 days ?Timing:  Intermittent ?Progression:  Waxing and waning ?Chronicity:  New ?Relieved by:  Nothing ?Worsened by:  Nothing ?Associated symptoms: no anorexia, no belching, no chest pain, no chills, no constipation, no cough, no diarrhea, no dysuria, no fatigue, no flatus, no hematemesis, no melena, no nausea, no shortness of breath, no sore throat and no vomiting   ?Risk factors comment:  CKD, PVD, stroke ? ?  ? ?Home Medications ?Prior to Admission medications   ?Medication Sig Start Date End Date Taking? Authorizing Provider  ?acetaminophen (TYLENOL) 500 MG tablet Take 1,000 mg by mouth daily as needed for mild pain.   Yes [provider]  ?albuterol (VENTOLIN HFA) 108 (90 Base) MCG/ACT inhaler INHALE 1-2 PUFFS INTO THE LUNGS EVERY 6 (SIX) HOURS AS NEEDED FOR WHEEZING OR SHORTNESS OF BREATH. ?Patient taking differently: Inhale 2 puffs into the lungs every 6 (six) hours as needed for wheezing or shortness of breath. 07/02/20 10/11/21 Yes Maness, Arnette Norris, MD  ?amLODipine (NORVASC) 10 MG tablet Take 1 tablet (10 mg total) by mouth daily. 08/30/21  Yes Lacinda Axon, MD  ?amoxicillin-clavulanate (AUGMENTIN) 500-125 MG tablet Take 1 tablet daily. Take after dialysis on Tuesday, Thursday and Saturday. ?Patient taking differently: Take 1 tablet by mouth daily. Take after dialysis on Tuesday, Thursday and Saturday. 08/30/21  Yes Lacinda Axon, MD  ?aspirin 325 MG EC tablet Take 325 mg by mouth daily as needed for pain.   Yes [provider]  ?atorvastatin  (LIPITOR) 40 MG tablet Take 1 tablet (40 mg total) by mouth daily. 08/30/21 02/26/22 Yes Amponsah, Charisse March, MD  ?calcium acetate (PHOSLO) 667 MG capsule Take 2 capsule by mouth three times a day with meals ?Patient taking differently: Take 1,334 mg by mouth 3 (three) times daily with meals. 06/17/21  Yes   ?oxyCODONE (ROXICODONE) 5 MG immediate release tablet Take 1 tablet (5 mg total) by mouth every 8 (eight) hours as needed for up to 10 doses for breakthrough pain. 09/03/21  Yes Martesha Niedermeier, DO  ?aspirin EC 81 MG EC tablet Take 1 tablet (81 mg total) by mouth daily. Swallow whole. ?Patient not taking: Reported on 09/03/2021 08/30/21   Lacinda Axon, MD  ?hydrOXYzine (ATARAX) 10 MG tablet Take 1 tablet (10 mg total) by mouth 3 (three) times daily as needed. ?Patient not taking: Reported on 08/28/2021 06/18/21   Kerin Perna, NP  ?metoprolol tartrate (LOPRESSOR) 25 MG tablet TAKE 1 TABLET BY MOUTH TWICE A DAY ?Patient not taking: Reported on 09/03/2021 07/25/20 10/11/21  Corliss Parish, MD  ?   ? ?Allergies    ?Lidocaine-prilocaine   ? ?Review of Systems   ?Review of Systems  ?Constitutional:  Negative for chills and fatigue.  ?HENT:  Negative for sore throat.   ?Respiratory:  Negative for cough and shortness of breath.   ?Cardiovascular:  Negative for chest pain.  ?Gastrointestinal:  Positive for abdominal pain. Negative for anorexia, constipation, diarrhea, flatus, hematemesis, melena, nausea and vomiting.  ?Genitourinary:  Negative for  dysuria.  ? ?Physical Exam ?Updated Vital Signs ?BP (!) 178/63   Pulse 78   Temp 97.6 ?F (36.4 ?C) (Oral)   Resp 14   SpO2 96%  ?Physical Exam ?Vitals and nursing note reviewed.  ?Constitutional:   ?   General: He is not in acute distress. ?   Appearance: He is well-developed. He is not ill-appearing.  ?HENT:  ?   Head: Normocephalic and atraumatic.  ?   Mouth/Throat:  ?   Mouth: Mucous membranes are moist.  ?Eyes:  ?   Extraocular Movements: Extraocular movements  intact.  ?   Conjunctiva/sclera: Conjunctivae normal.  ?Cardiovascular:  ?   Rate and Rhythm: Normal rate and regular rhythm.  ?   Heart sounds: Normal heart sounds. No murmur heard. ?Pulmonary:  ?   Effort: Pulmonary effort is normal. No respiratory distress.  ?   Breath sounds: Normal breath sounds.  ?Abdominal:  ?   General: There is distension.  ?   Palpations: Abdomen is soft.  ?   Tenderness: There is generalized abdominal tenderness.  ?Musculoskeletal:     ?   General: No swelling.  ?   Cervical back: Neck supple.  ?Skin: ?   General: Skin is warm and dry.  ?   Capillary Refill: Capillary refill takes less than 2 seconds.  ?Neurological:  ?   General: No focal deficit present.  ?   Mental Status: He is alert.  ?Psychiatric:     ?   Mood and Affect: Mood normal.  ? ? ?ED Results / Procedures / Treatments   ?Labs ?(all labs ordered are listed, but only abnormal results are displayed) ?Labs Reviewed  ?CBC WITH DIFFERENTIAL/PLATELET - Abnormal; Notable for the following components:  ?    Result Value  ? RBC 3.14 (*)   ? Hemoglobin 10.7 (*)   ? HCT 33.0 (*)   ? MCV 105.1 (*)   ? MCH 34.1 (*)   ? RDW 18.5 (*)   ? Neutro Abs 8.0 (*)   ? All other components within normal limits  ?COMPREHENSIVE METABOLIC PANEL - Abnormal; Notable for the following components:  ? Chloride 96 (*)   ? Glucose, Bld 104 (*)   ? BUN 53 (*)   ? Creatinine, Ser 11.28 (*)   ? Albumin 3.3 (*)   ? Total Bilirubin 1.5 (*)   ? GFR, Estimated 5 (*)   ? Anion gap 18 (*)   ? All other components within normal limits  ?LIPASE, BLOOD  ? ? ?EKG ?None ? ?Radiology ?CT ABDOMEN PELVIS W CONTRAST ? ?Result Date: 09/03/2021 ?CLINICAL DATA:  Left lower quadrant abdominal pain. History of end-stage renal disease on hemodialysis EXAM: CT ABDOMEN AND PELVIS WITH CONTRAST TECHNIQUE: Multidetector CT imaging of the abdomen and pelvis was performed using the standard protocol following bolus administration of intravenous contrast. RADIATION DOSE REDUCTION: This  exam was performed according to the departmental dose-optimization program which includes automated exposure control, adjustment of the mA and/or kV according to patient size and/or use of iterative reconstruction technique. CONTRAST:  169mL OMNIPAQUE IOHEXOL 300 MG/ML  SOLN COMPARISON:  CT August 07, 2021 FINDINGS: Lower chest: Similar four-chamber cardiomegaly with trace pulmonary edema. Hepatobiliary: Hypodense peripheral 15 mm area in the posterior right lobe of the liver on image 13/3 which demonstrates a nodular focus of enhancement along the periphery on image 13/3, this is in close proximity to the previously described healing tenth lateral rib fracture seen on image 15/3 but was not  seen on CT August 07, 2021 nonspecific finding possibly reflecting sequela prior trauma, artifact or discrete hepatic lesion such as a hemangioma. Gallbladder is unremarkable. No biliary ductal dilation. Pancreas: No pancreatic ductal dilation or evidence of acute inflammation. Unchanged size of the likely splenule in the tail the pancreas. Spleen: No splenomegaly or focal splenic lesion. Adrenals/Urinary Tract: Bilateral adrenal glands appear normal. Atrophic native kidneys. No hydronephrosis. Urinary bladder is nondistended limiting evaluation. Stomach/Bowel: Stomach is unremarkable for degree of distension. No pathologic dilation of small or large bowel. Terminal ileum appears normal. Appendix is not confidently identified however there is no evidence of pericecal inflammation. Colonic diverticulosis without definite evidence of acute diverticulitis. Vascular/Lymphatic: Left periaortic nodular stranding at the level of the left renal artery on image 26/3 which is new from August 07, 2021. Extensive vascular calcifications throughout the abdomen. Stable high-grade stenosis of the SMA. Bilateral iliac artery stents appear patent. Chronic right superior femoral artery occlusion. No pathologically enlarged abdominal or pelvic lymph  nodes. Reproductive: Prostate is unremarkable.  Hydroceles. Other: Small volume abdominopelvic free fluid. No pneumoperitoneum. Posterior left paramedian subcutaneous nodule on image 21/3 likely reflects a seba

## 2021-09-03 NOTE — ED Triage Notes (Signed)
Pt brought toED by [facility] via wheelchair with c/o LLQ abdominal pain since Sunday. Pt has hx of DVT and was recently released from hospital for the same. Is  Hemodialysis pt [facility]-TH-S. Did not receive dialysis today. Last BM was approximately 5 days ago. Denies N/V.  ?

## 2021-09-03 NOTE — ED Notes (Signed)
Patient transported to CT 

## 2021-09-04 ENCOUNTER — Encounter (HOSPITAL_COMMUNITY): Payer: Self-pay | Admitting: Vascular Surgery

## 2021-09-04 ENCOUNTER — Other Ambulatory Visit: Payer: Self-pay

## 2021-09-04 NOTE — Progress Notes (Signed)
Anesthesia Chart Review: ?Same-day work-up ? ?History of ESRD on HD with dialysis noncompliance.  He was admitted in December 2022 after presenting secondary to fall at home.  At that time he had not had dialysis for a week.  During admission he developed acute metabolic encephalopathy and was found to have blood cultures growing MRSA. CT head was normal.  Subsequent MRI with a small subdural possibly happened after a fall in the bathroom but no other complications.  Echo was done 12/2 showing global hypokinesis with mild/mod depressed EF 40 to 45%, grade 2 DD, severe biatrial dilation, no significant valvular abnormalities.  This was followed by TEE 05/13/2021 which showed EF 35-40%, no evidence of vegetation. ? ?He was again admitted March of 2023 for severe PAD and right great toe wound.  He has a history of bilateral external iliac artery stenting January 2022.  Vascular surgery was consulted and angiography showed limb threatening ischemia.  Right femoral-popliteal artery bypass was recommended to be done as outpatient. ? ?Reviewed medical history with Dr. Ermalene Postin. Advised okay to proceed barring acute status change.  ? ?CMP and CBC 09/03/2021 reviewed, renal function consistent with ESRD, stable mild anemia with hemoglobin 10.7. ? ?Patient will need day of surgery evaluation. ? ?EKG 09/03/2021: NSR.  Rate 82.  Possible LAE.  Left ventricular hypertrophy with repolarization abnormality. ? ?TEE 05/13/2021: ? 1. Left ventricular ejection fraction, by estimation, is 35 to 40%. The  ?left ventricle has moderately decreased function. There is severe left  ?ventricular hypertrophy.  ? 2. Right ventricular systolic function is mildly reduced. The right  ?ventricular size is mildly enlarged.  ? 3. Left atrial size was severely dilated. No left atrial/left atrial  ?appendage thrombus was detected.  ? 4. Right atrial size was severely dilated.  ? 5. The mitral valve is normal in structure. Mild mitral valve  ?regurgitation.  ?  6. The aortic valve is tricuspid. Aortic valve regurgitation is not  ?visualized. No aortic stenosis is present.  ? 7. No vegetation seen ? ?TTE 05/09/2021: ? 1. Left ventricular ejection fraction, by estimation, is 40 to 45%. The  ?left ventricle has mildly decreased function. The left ventricle  ?demonstrates global hypokinesis. There is moderate left ventricular  ?hypertrophy. Left ventricular diastolic  ?parameters are consistent with Grade II diastolic dysfunction  ?(pseudonormalization). Elevated left atrial pressure.  ? 2. Right ventricular systolic function is mildly reduced. The right  ?ventricular size is mildly enlarged. There is moderately elevated  ?pulmonary artery systolic pressure. The estimated right ventricular  ?systolic pressure is 56.3 mmHg.  ? 3. Left atrial size was severely dilated.  ? 4. Right atrial size was severely dilated.  ? 5. The mitral valve is normal in structure. Mild mitral valve  ?regurgitation. No evidence of mitral stenosis.  ? 6. The aortic valve was not well visualized. Aortic valve regurgitation  ?is not visualized. Aortic valve sclerosis/calcification is present,  ?without any evidence of aortic stenosis.  ? 7. The inferior vena cava is dilated in size with <50% respiratory  ?variability, suggesting right atrial pressure of 15 mmHg.  ? ?Conclusion(s)/Recommendation(s): No evidence of valvular vegetations on  ?this transthoracic echocardiogram. Consider a transesophageal  ?echocardiogram to exclude infective endocarditis if clinically indicated.  ? ? ? ?Karoline Caldwell, PA-C ?[facility]/Anesthesiology ?Phone [phone] ?09/04/2021 11:04 AM ? ?

## 2021-09-04 NOTE — Anesthesia Preprocedure Evaluation (Addendum)
Anesthesia Evaluation  ?Patient identified by MRN, date of birth, ID band ?Patient awake ? ? ? ?Reviewed: ?Allergy & Precautions, NPO status , Patient's Chart, lab work & pertinent test results, reviewed documented beta blocker date and time  ? ?Airway ?Mallampati: I ? ?TM Distance: >3 FB ?Neck ROM: Full ? ? ? Dental ? ?(+) Poor Dentition, Missing, Loose, Chipped, Dental Advisory Given ?  ?Pulmonary ?asthma , COPD, former smoker,  ?  ?breath sounds clear to auscultation ? ? ? ? ? ? Cardiovascular ?hypertension, Pt. on home beta blockers and Pt. on medications ?+ Peripheral Vascular Disease and +CHF  ? ?Rhythm:Regular Rate:Normal ? ? ?  ?Neuro/Psych ?TIACVA negative psych ROS  ? GI/Hepatic ?Neg liver ROS, GERD  ,  ?Endo/Other  ? ? Renal/GU ?  ? ?  ?Musculoskeletal ?negative musculoskeletal ROS ?(+)  ? Abdominal ?Normal abdominal exam  (+)   ?Peds ? Hematology ?  ?Anesthesia Other Findings ? ? Reproductive/Obstetrics ? ?  ? ? ? ? ? ? ? ? ? ? ? ? ? ?  ?  ? ? ? ? ? ? ?Anesthesia Physical ?Anesthesia Plan ? ?ASA: 3 ? ?Anesthesia Plan: General  ? ?Post-op Pain Management:   ? ?Induction: Intravenous ? ?PONV Risk Score and Plan: 3 and Ondansetron, Dexamethasone, Midazolam and Treatment may vary due to age or medical condition ? ?Airway Management Planned: Oral ETT ? ?Additional Equipment:  ? ?Intra-op Plan:  ? ?Post-operative Plan: Extubation in OR ? ?Informed Consent: I have reviewed the patients History and Physical, chart, labs and discussed the procedure including the risks, benefits and alternatives for the proposed anesthesia with the patient or authorized representative who has indicated his/her understanding and acceptance.  ? ? ? ?Dental advisory given ? ?Plan Discussed with: CRNA ? ?Anesthesia Plan Comments: (Possible arterial line. No ACT's per surgeon.  ? ?PAT note by Karoline Caldwell, PA-C: ? ?History of ESRD on HD with dialysis noncompliance.  He was admitted in December 2022  after presenting secondary to fall at home.  At that time he had not had dialysis for a week.  During admission he developed acute metabolic encephalopathy and was found to have blood cultures growing MRSA. CT head was normal. ?Subsequent MRI with a small subdural possibly happened after a fall in the bathroom but no other complications.  Echo was done 12/2 showing global hypokinesis with mild/mod depressed EF 40 to 45%, grade 2 DD, severe biatrial dilation, no significant valvular abnormalities.  This was followed by TEE 05/13/2021 which showed EF 35-40%, no evidence of vegetation. ? ?He was again admitted March of 2023 for severe PAD and right great toe wound.  He has a history of bilateral external iliac artery stenting January 2022.  Vascular surgery was consulted and angiography showed limb threatening ischemia.  Right femoral-popliteal artery bypass was recommended to be done as outpatient. ? ?Reviewed medical history with Dr. Ermalene Postin. Advised okay to proceed barring acute status change.  ? ?CMP and CBC 09/03/2021 reviewed, renal function consistent with ESRD, stable mild anemia with hemoglobin 10.7. ? ?Patient will need day of surgery evaluation. ? ?EKG 09/03/2021: NSR.  Rate 82.  Possible LAE.  Left ventricular hypertrophy with repolarization abnormality. ? ?TEE 05/13/2021: ??1. Left ventricular ejection fraction, by estimation, is 35 to 40%. The  ?left ventricle has moderately decreased function. There is severe left  ?ventricular hypertrophy.  ??2. Right ventricular systolic function is mildly reduced. The right  ?ventricular size is mildly enlarged.  ??3. Left atrial size was  severely dilated. No left atrial/left atrial  ?appendage thrombus was detected.  ??4. Right atrial size was severely dilated.  ??5. The mitral valve is normal in structure. Mild mitral valve  ?regurgitation.  ??6. The aortic valve is tricuspid. Aortic valve regurgitation is not  ?visualized. No aortic stenosis is present.  ??7. No  vegetation seen ? ?TTE 05/09/2021: ??1. Left ventricular ejection fraction, by estimation, is 40 to 45%. The  ?left ventricle has mildly decreased function. The left ventricle  ?demonstrates global hypokinesis. There is moderate left ventricular  ?hypertrophy. Left ventricular diastolic  ?parameters are consistent with Grade II diastolic dysfunction  ?(pseudonormalization). Elevated left atrial pressure.  ??2. Right ventricular systolic function is mildly reduced. The right  ?ventricular size is mildly enlarged. There is moderately elevated  ?pulmonary artery systolic pressure. The estimated right ventricular  ?systolic pressure is 38.2 mmHg.  ??3. Left atrial size was severely dilated.  ??4. Right atrial size was severely dilated.  ??5. The mitral valve is normal in structure. Mild mitral valve  ?regurgitation. No evidence of mitral stenosis.  ??6. The aortic valve was not well visualized. Aortic valve regurgitation  ?is not visualized. Aortic valve sclerosis/calcification is present,  ?without any evidence of aortic stenosis.  ??7. The inferior vena cava is dilated in size with <50% respiratory  ?variability, suggesting right atrial pressure of 15 mmHg.  ? ?Conclusion(s)/Recommendation(s): No evidence of valvular vegetations on  ?this transthoracic echocardiogram. Consider a transesophageal  ?echocardiogram to exclude infective endocarditis if clinically indicated.  ? ?)  ? ? ? ? ?Anesthesia Quick Evaluation ? ?
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
## 2021-09-05 ENCOUNTER — Encounter (HOSPITAL_COMMUNITY): Payer: Self-pay | Admitting: Vascular Surgery

## 2021-09-05 ENCOUNTER — Encounter (HOSPITAL_COMMUNITY)
Admission: RE | Disposition: A | Discharge status: Home / Self Care | Payer: Self-pay | Source: Home / Self Care | Attending: Vascular Surgery

## 2021-09-05 ENCOUNTER — Inpatient Hospital Stay (HOSPITAL_COMMUNITY): Payer: Medicare Other | Admitting: Physician Assistant

## 2021-09-05 ENCOUNTER — Inpatient Hospital Stay (HOSPITAL_COMMUNITY): Payer: Medicare Other

## 2021-09-05 ENCOUNTER — Inpatient Hospital Stay (HOSPITAL_COMMUNITY)
Admission: RE | Admit: 2021-09-05 | Discharge: 2021-09-07 | DRG: 252 | Disposition: A | Discharge status: Home / Self Care | Payer: Medicare Other | Attending: Vascular Surgery | Admitting: Vascular Surgery

## 2021-09-05 ENCOUNTER — Other Ambulatory Visit: Payer: Self-pay

## 2021-09-05 DIAGNOSIS — Z8673 Personal history of transient ischemic attack (TIA), and cerebral infarction without residual deficits: Secondary | ICD-10-CM | POA: Diagnosis not present

## 2021-09-05 DIAGNOSIS — I132 Hypertensive heart and chronic kidney disease with heart failure and with stage 5 chronic kidney disease, or end stage renal disease: Secondary | ICD-10-CM | POA: Diagnosis present

## 2021-09-05 DIAGNOSIS — Z79899 Other long term (current) drug therapy: Secondary | ICD-10-CM | POA: Diagnosis not present

## 2021-09-05 DIAGNOSIS — J449 Chronic obstructive pulmonary disease, unspecified: Secondary | ICD-10-CM

## 2021-09-05 DIAGNOSIS — Z992 Dependence on renal dialysis: Secondary | ICD-10-CM | POA: Diagnosis not present

## 2021-09-05 DIAGNOSIS — N2581 Secondary hyperparathyroidism of renal origin: Secondary | ICD-10-CM | POA: Diagnosis present

## 2021-09-05 DIAGNOSIS — E889 Metabolic disorder, unspecified: Secondary | ICD-10-CM | POA: Diagnosis present

## 2021-09-05 DIAGNOSIS — I739 Peripheral vascular disease, unspecified: Principal | ICD-10-CM | POA: Diagnosis present

## 2021-09-05 DIAGNOSIS — Z8249 Family history of ischemic heart disease and other diseases of the circulatory system: Secondary | ICD-10-CM | POA: Diagnosis not present

## 2021-09-05 DIAGNOSIS — L97519 Non-pressure chronic ulcer of other part of right foot with unspecified severity: Secondary | ICD-10-CM | POA: Diagnosis present

## 2021-09-05 DIAGNOSIS — Z91158 Patient's noncompliance with renal dialysis for other reason: Secondary | ICD-10-CM

## 2021-09-05 DIAGNOSIS — I70221 Atherosclerosis of native arteries of extremities with rest pain, right leg: Secondary | ICD-10-CM

## 2021-09-05 DIAGNOSIS — N186 End stage renal disease: Secondary | ICD-10-CM | POA: Diagnosis present

## 2021-09-05 DIAGNOSIS — E785 Hyperlipidemia, unspecified: Secondary | ICD-10-CM | POA: Diagnosis present

## 2021-09-05 DIAGNOSIS — I70235 Atherosclerosis of native arteries of right leg with ulceration of other part of foot: Secondary | ICD-10-CM

## 2021-09-05 DIAGNOSIS — Z884 Allergy status to anesthetic agent status: Secondary | ICD-10-CM

## 2021-09-05 DIAGNOSIS — I110 Hypertensive heart disease with heart failure: Secondary | ICD-10-CM

## 2021-09-05 DIAGNOSIS — I5022 Chronic systolic (congestive) heart failure: Secondary | ICD-10-CM

## 2021-09-05 DIAGNOSIS — D631 Anemia in chronic kidney disease: Secondary | ICD-10-CM | POA: Diagnosis present

## 2021-09-05 DIAGNOSIS — K219 Gastro-esophageal reflux disease without esophagitis: Secondary | ICD-10-CM | POA: Diagnosis present

## 2021-09-05 DIAGNOSIS — H9190 Unspecified hearing loss, unspecified ear: Secondary | ICD-10-CM | POA: Diagnosis present

## 2021-09-05 DIAGNOSIS — Z87891 Personal history of nicotine dependence: Secondary | ICD-10-CM

## 2021-09-05 DIAGNOSIS — I998 Other disorder of circulatory system: Secondary | ICD-10-CM | POA: Diagnosis not present

## 2021-09-05 HISTORY — PX: VEIN HARVEST: SHX6363

## 2021-09-05 HISTORY — PX: ENDARTERECTOMY FEMORAL: SHX5804

## 2021-09-05 HISTORY — DX: Heart failure, unspecified: I50.9

## 2021-09-05 HISTORY — PX: FEMORAL-POPLITEAL BYPASS GRAFT: SHX937

## 2021-09-05 LAB — PROTIME-INR
INR: 1.3 — ABNORMAL HIGH (ref 0.8–1.2)
Prothrombin Time: 15.8 seconds — ABNORMAL HIGH (ref 11.4–15.2)

## 2021-09-05 LAB — APTT
aPTT: 35 seconds (ref 24–36)
aPTT: 38 seconds — ABNORMAL HIGH (ref 24–36)

## 2021-09-05 LAB — CBC
HCT: 31.7 % — ABNORMAL LOW (ref 39.0–52.0)
Hemoglobin: 10 g/dL — ABNORMAL LOW (ref 13.0–17.0)
MCH: 33.6 pg (ref 26.0–34.0)
MCHC: 31.5 g/dL (ref 30.0–36.0)
MCV: 106.4 fL — ABNORMAL HIGH (ref 80.0–100.0)
Platelets: 143 10*3/uL — ABNORMAL LOW (ref 150–400)
RBC: 2.98 MIL/uL — ABNORMAL LOW (ref 4.22–5.81)
RDW: 17.8 % — ABNORMAL HIGH (ref 11.5–15.5)
WBC: 10.2 10*3/uL (ref 4.0–10.5)
nRBC: 0 % (ref 0.0–0.2)

## 2021-09-05 LAB — POCT I-STAT, CHEM 8
BUN: 27 mg/dL — ABNORMAL HIGH (ref 8–23)
Calcium, Ion: 1 mmol/L — ABNORMAL LOW (ref 1.15–1.40)
Chloride: 93 mmol/L — ABNORMAL LOW (ref 98–111)
Creatinine, Ser: 6.9 mg/dL — ABNORMAL HIGH (ref 0.61–1.24)
Glucose, Bld: 91 mg/dL (ref 70–99)
HCT: 35 % — ABNORMAL LOW (ref 39.0–52.0)
Hemoglobin: 11.9 g/dL — ABNORMAL LOW (ref 13.0–17.0)
Potassium: 3.9 mmol/L (ref 3.5–5.1)
Sodium: 138 mmol/L (ref 135–145)
TCO2: 35 mmol/L — ABNORMAL HIGH (ref 22–32)

## 2021-09-05 LAB — TYPE AND SCREEN
ABO/RH(D): O POS
Antibody Screen: NEGATIVE

## 2021-09-05 SURGERY — BYPASS GRAFT FEMORAL-POPLITEAL ARTERY
Anesthesia: General | Site: Leg Upper | Laterality: Right

## 2021-09-05 MED ORDER — METOPROLOL TARTRATE 25 MG PO TABS
25.0000 mg | ORAL_TABLET | Freq: Two times a day (BID) | ORAL | Status: DC
  Administered 2021-09-05 – 2021-09-07 (×3): 25 mg via ORAL
  Filled 2021-09-05 (×3): qty 1

## 2021-09-05 MED ORDER — POLYETHYLENE GLYCOL 3350 17 G PO PACK: 17.0000 g | PACK | Freq: Every day | ORAL | Status: DC | PRN

## 2021-09-05 MED ORDER — CHLORHEXIDINE GLUCONATE 0.12 % MT SOLN: 15.0000 mL | Freq: Once | OROMUCOSAL | Status: AC

## 2021-09-05 MED ORDER — ACETAMINOPHEN 10 MG/ML IV SOLN
1000.0000 mg | Freq: Once | INTRAVENOUS | Status: DC | PRN
  Administered 2021-09-05: 1000 mg via INTRAVENOUS

## 2021-09-05 MED ORDER — LABETALOL HCL 5 MG/ML IV SOLN: 10.0000 mg | INTRAVENOUS | Status: DC | PRN

## 2021-09-05 MED ORDER — AMLODIPINE BESYLATE 10 MG PO TABS
10.0000 mg | ORAL_TABLET | Freq: Every day | ORAL | Status: DC
  Administered 2021-09-05 – 2021-09-07 (×3): 10 mg via ORAL
  Filled 2021-09-05 (×3): qty 1

## 2021-09-05 MED ORDER — FENTANYL CITRATE (PF) 100 MCG/2ML IJ SOLN
INTRAMUSCULAR | Status: AC
Start: 2021-09-05 — End: 2021-09-06
  Filled 2021-09-05: qty 2

## 2021-09-05 MED ORDER — ONDANSETRON HCL 4 MG/2ML IJ SOLN: 4.0000 mg | Freq: Four times a day (QID) | INTRAMUSCULAR | Status: DC | PRN

## 2021-09-05 MED ORDER — PROPOFOL 10 MG/ML IV BOLUS
INTRAVENOUS | Status: AC
  Filled 2021-09-05: qty 20

## 2021-09-05 MED ORDER — DEXAMETHASONE SODIUM PHOSPHATE 10 MG/ML IJ SOLN
INTRAMUSCULAR | Status: AC
  Filled 2021-09-05: qty 1

## 2021-09-05 MED ORDER — ALBUTEROL SULFATE (2.5 MG/3ML) 0.083% IN NEBU: 3.0000 mL | INHALATION_SOLUTION | Freq: Four times a day (QID) | RESPIRATORY_TRACT | Status: DC | PRN

## 2021-09-05 MED ORDER — ROCURONIUM BROMIDE 10 MG/ML (PF) SYRINGE
PREFILLED_SYRINGE | INTRAVENOUS | Status: DC | PRN
  Administered 2021-09-05: 20 mg via INTRAVENOUS
  Administered 2021-09-05: 60 mg via INTRAVENOUS
  Administered 2021-09-05: 20 mg via INTRAVENOUS

## 2021-09-05 MED ORDER — VASOPRESSIN 20 UNIT/ML IV SOLN
INTRAVENOUS | Status: AC
  Filled 2021-09-05: qty 1

## 2021-09-05 MED ORDER — RENA-VITE PO TABS
1.0000 | ORAL_TABLET | Freq: Every day | ORAL | Status: DC
  Administered 2021-09-05 – 2021-09-06 (×2): 1 via ORAL
  Filled 2021-09-05 (×2): qty 1

## 2021-09-05 MED ORDER — CEFAZOLIN SODIUM-DEXTROSE 2-4 GM/100ML-% IV SOLN
2.0000 g | INTRAVENOUS | Status: AC
  Administered 2021-09-05: 2 g via INTRAVENOUS

## 2021-09-05 MED ORDER — ASPIRIN EC 81 MG PO TBEC
81.0000 mg | DELAYED_RELEASE_TABLET | Freq: Every day | ORAL | Status: DC
  Administered 2021-09-05 – 2021-09-07 (×3): 81 mg via ORAL
  Filled 2021-09-05 (×3): qty 1

## 2021-09-05 MED ORDER — DOXERCALCIFEROL 4 MCG/2ML IV SOLN
5.0000 ug | INTRAVENOUS | Status: DC
  Administered 2021-09-06: 5 ug via INTRAVENOUS
  Filled 2021-09-05 (×2): qty 4

## 2021-09-05 MED ORDER — HEPARIN SODIUM (PORCINE) 1000 UNIT/ML IJ SOLN
INTRAMUSCULAR | Status: AC
  Filled 2021-09-05: qty 10

## 2021-09-05 MED ORDER — ACETAMINOPHEN 325 MG PO TABS: 325.0000 mg | ORAL_TABLET | ORAL | Status: DC | PRN

## 2021-09-05 MED ORDER — ACETAMINOPHEN 650 MG RE SUPP: 325.0000 mg | RECTAL | Status: DC | PRN

## 2021-09-05 MED ORDER — PHENOL 1.4 % MT LIQD: 1.0000 | OROMUCOSAL | Status: DC | PRN

## 2021-09-05 MED ORDER — FENTANYL CITRATE (PF) 100 MCG/2ML IJ SOLN
INTRAMUSCULAR | Status: AC
  Filled 2021-09-05: qty 2

## 2021-09-05 MED ORDER — POTASSIUM CHLORIDE CRYS ER 20 MEQ PO TBCR: 20.0000 meq | EXTENDED_RELEASE_TABLET | Freq: Every day | ORAL | Status: DC | PRN

## 2021-09-05 MED ORDER — CHLORHEXIDINE GLUCONATE CLOTH 2 % EX PADS: 6.0000 | MEDICATED_PAD | Freq: Once | CUTANEOUS | Status: DC

## 2021-09-05 MED ORDER — CEFAZOLIN SODIUM-DEXTROSE 2-4 GM/100ML-% IV SOLN
INTRAVENOUS | Status: AC
  Filled 2021-09-05: qty 100

## 2021-09-05 MED ORDER — AMOXICILLIN-POT CLAVULANATE 500-125 MG PO TABS
1.0000 | ORAL_TABLET | ORAL | Status: DC
  Administered 2021-09-06: 500 mg via ORAL
  Filled 2021-09-05: qty 1

## 2021-09-05 MED ORDER — HEPARIN (PORCINE) 25000 UT/250ML-% IV SOLN
INTRAVENOUS | Status: AC
  Filled 2021-09-05: qty 250

## 2021-09-05 MED ORDER — PROTAMINE SULFATE 10 MG/ML IV SOLN
INTRAVENOUS | Status: DC | PRN
  Administered 2021-09-05: 50 mg via INTRAVENOUS

## 2021-09-05 MED ORDER — HYDROXYZINE HCL 10 MG PO TABS: 10.0000 mg | ORAL_TABLET | Freq: Three times a day (TID) | ORAL | Status: DC | PRN

## 2021-09-05 MED ORDER — OXYCODONE HCL 5 MG/5ML PO SOLN
5.0000 mg | Freq: Once | ORAL | Status: AC | PRN
  Administered 2021-09-05: 5 mg via ORAL

## 2021-09-05 MED ORDER — METOPROLOL TARTRATE 5 MG/5ML IV SOLN: 2.0000 mg | INTRAVENOUS | Status: DC | PRN

## 2021-09-05 MED ORDER — DOCUSATE SODIUM 100 MG PO CAPS
100.0000 mg | ORAL_CAPSULE | Freq: Every day | ORAL | Status: DC
  Administered 2021-09-06 – 2021-09-07 (×2): 100 mg via ORAL
  Filled 2021-09-05 (×2): qty 1

## 2021-09-05 MED ORDER — PAPAVERINE HCL 30 MG/ML IJ SOLN
INTRAMUSCULAR | Status: DC | PRN
  Administered 2021-09-05: 30 mg via INTRAVENOUS

## 2021-09-05 MED ORDER — LABETALOL HCL 5 MG/ML IV SOLN
INTRAVENOUS | Status: AC
Start: 2021-09-05 — End: 2021-09-06
  Filled 2021-09-05: qty 4

## 2021-09-05 MED ORDER — FENTANYL CITRATE (PF) 250 MCG/5ML IJ SOLN
INTRAMUSCULAR | Status: DC | PRN
  Administered 2021-09-05 (×5): 50 ug via INTRAVENOUS

## 2021-09-05 MED ORDER — ALUM & MAG HYDROXIDE-SIMETH 200-200-20 MG/5ML PO SUSP: 15.0000 mL | ORAL | Status: DC | PRN

## 2021-09-05 MED ORDER — PANTOPRAZOLE SODIUM 40 MG PO TBEC
40.0000 mg | DELAYED_RELEASE_TABLET | Freq: Every day | ORAL | Status: DC
  Administered 2021-09-05 – 2021-09-07 (×3): 40 mg via ORAL
  Filled 2021-09-05 (×3): qty 1

## 2021-09-05 MED ORDER — ORAL CARE MOUTH RINSE: 15.0000 mL | Freq: Once | OROMUCOSAL | Status: AC

## 2021-09-05 MED ORDER — ONDANSETRON HCL 4 MG/2ML IJ SOLN
INTRAMUSCULAR | Status: DC | PRN
  Administered 2021-09-05: 4 mg via INTRAVENOUS

## 2021-09-05 MED ORDER — CHLORHEXIDINE GLUCONATE CLOTH 2 % EX PADS
6.0000 | MEDICATED_PAD | Freq: Every day | CUTANEOUS | Status: DC
  Administered 2021-09-06: 6 via TOPICAL

## 2021-09-05 MED ORDER — MIDAZOLAM HCL 2 MG/2ML IJ SOLN
INTRAMUSCULAR | Status: AC
  Filled 2021-09-05: qty 2

## 2021-09-05 MED ORDER — HEPARIN (PORCINE) 25000 UT/250ML-% IV SOLN
400.0000 [IU]/h | INTRAVENOUS | Status: DC
  Administered 2021-09-05: 400 [IU]/h via INTRAVENOUS

## 2021-09-05 MED ORDER — HEPARIN 6000 UNIT IRRIGATION SOLUTION
Status: AC
  Filled 2021-09-05: qty 500

## 2021-09-05 MED ORDER — PAPAVERINE HCL 30 MG/ML IJ SOLN
INTRAMUSCULAR | Status: AC
  Filled 2021-09-05: qty 2

## 2021-09-05 MED ORDER — 0.9 % SODIUM CHLORIDE (POUR BTL) OPTIME
TOPICAL | Status: DC | PRN
Start: 2021-09-05 — End: 2021-09-05
  Administered 2021-09-05: 1000 mL

## 2021-09-05 MED ORDER — ACETAMINOPHEN 160 MG/5ML PO SOLN: 325.0000 mg | ORAL | Status: DC | PRN

## 2021-09-05 MED ORDER — OXYCODONE HCL 5 MG/5ML PO SOLN
ORAL | Status: AC
  Filled 2021-09-05: qty 5

## 2021-09-05 MED ORDER — FENTANYL CITRATE (PF) 100 MCG/2ML IJ SOLN
25.0000 ug | INTRAMUSCULAR | Status: DC | PRN
  Administered 2021-09-05 (×3): 50 ug via INTRAVENOUS

## 2021-09-05 MED ORDER — GUAIFENESIN-DM 100-10 MG/5ML PO SYRP: 15.0000 mL | ORAL_SOLUTION | ORAL | Status: DC | PRN

## 2021-09-05 MED ORDER — DEXAMETHASONE SODIUM PHOSPHATE 10 MG/ML IJ SOLN
INTRAMUSCULAR | Status: DC | PRN
  Administered 2021-09-05: 8 mg via INTRAVENOUS

## 2021-09-05 MED ORDER — IOHEXOL 300 MG/ML  SOLN
INTRAMUSCULAR | Status: DC | PRN
Start: 2021-09-05 — End: 2021-09-05
  Administered 2021-09-05: 15 mL

## 2021-09-05 MED ORDER — CEFAZOLIN SODIUM-DEXTROSE 2-4 GM/100ML-% IV SOLN
2.0000 g | Freq: Three times a day (TID) | INTRAVENOUS | Status: AC
  Administered 2021-09-05 – 2021-09-06 (×2): 2 g via INTRAVENOUS
  Filled 2021-09-05 (×2): qty 100

## 2021-09-05 MED ORDER — HEPARIN SODIUM (PORCINE) 1000 UNIT/ML IJ SOLN
INTRAMUSCULAR | Status: DC | PRN
  Administered 2021-09-05: 2000 [IU] via INTRAVENOUS
  Administered 2021-09-05: 7500 [IU] via INTRAVENOUS

## 2021-09-05 MED ORDER — HYDROMORPHONE HCL 1 MG/ML IJ SOLN
0.5000 mg | INTRAMUSCULAR | Status: DC | PRN
  Administered 2021-09-05: 1 mg via INTRAVENOUS
  Filled 2021-09-05: qty 1

## 2021-09-05 MED ORDER — SODIUM CHLORIDE 0.9 % IV SOLN
INTRAVENOUS | Status: DC
Start: 2021-09-05 — End: 2021-09-05

## 2021-09-05 MED ORDER — MAGNESIUM SULFATE 2 GM/50ML IV SOLN: 2.0000 g | Freq: Every day | INTRAVENOUS | Status: DC | PRN

## 2021-09-05 MED ORDER — BISACODYL 10 MG RE SUPP
10.0000 mg | Freq: Every day | RECTAL | Status: DC | PRN
Start: 2021-09-05 — End: 2021-09-07

## 2021-09-05 MED ORDER — NEPRO/CARBSTEADY PO LIQD: 237.0000 mL | Freq: Two times a day (BID) | ORAL | Status: DC

## 2021-09-05 MED ORDER — ROCURONIUM BROMIDE 10 MG/ML (PF) SYRINGE
PREFILLED_SYRINGE | INTRAVENOUS | Status: AC
  Filled 2021-09-05: qty 10

## 2021-09-05 MED ORDER — ONDANSETRON HCL 4 MG/2ML IJ SOLN
INTRAMUSCULAR | Status: AC
  Filled 2021-09-05: qty 2

## 2021-09-05 MED ORDER — CHLORHEXIDINE GLUCONATE 0.12 % MT SOLN
OROMUCOSAL | Status: AC
  Administered 2021-09-05: 15 mL via OROMUCOSAL
  Filled 2021-09-05: qty 15

## 2021-09-05 MED ORDER — HYDRALAZINE HCL 20 MG/ML IJ SOLN: 5.0000 mg | INTRAMUSCULAR | Status: DC | PRN

## 2021-09-05 MED ORDER — LACTATED RINGERS IV SOLN: INTRAVENOUS | Status: DC

## 2021-09-05 MED ORDER — OXYCODONE HCL 5 MG PO TABS: 5.0000 mg | ORAL_TABLET | Freq: Once | ORAL | Status: AC | PRN

## 2021-09-05 MED ORDER — HEPARIN 6000 UNIT IRRIGATION SOLUTION
Status: DC | PRN
  Administered 2021-09-05: 1

## 2021-09-05 MED ORDER — SUGAMMADEX SODIUM 200 MG/2ML IV SOLN
INTRAVENOUS | Status: DC | PRN
  Administered 2021-09-05: 200 mg via INTRAVENOUS

## 2021-09-05 MED ORDER — ACETAMINOPHEN 10 MG/ML IV SOLN
INTRAVENOUS | Status: AC
  Filled 2021-09-05: qty 100

## 2021-09-05 MED ORDER — AMISULPRIDE (ANTIEMETIC) 5 MG/2ML IV SOLN: 10.0000 mg | Freq: Once | INTRAVENOUS | Status: DC | PRN

## 2021-09-05 MED ORDER — PROPOFOL 10 MG/ML IV BOLUS
INTRAVENOUS | Status: DC | PRN
  Administered 2021-09-05: 120 mg via INTRAVENOUS

## 2021-09-05 MED ORDER — OXYCODONE-ACETAMINOPHEN 5-325 MG PO TABS
1.0000 | ORAL_TABLET | ORAL | Status: DC | PRN
  Administered 2021-09-05 – 2021-09-06 (×5): 2 via ORAL
  Filled 2021-09-05 (×5): qty 2

## 2021-09-05 MED ORDER — LABETALOL HCL 5 MG/ML IV SOLN
5.0000 mg | Freq: Once | INTRAVENOUS | Status: AC
  Administered 2021-09-05: 5 mg via INTRAVENOUS

## 2021-09-05 MED ORDER — PHENYLEPHRINE HCL-NACL 20-0.9 MG/250ML-% IV SOLN
INTRAVENOUS | Status: DC | PRN
Start: 2021-09-05 — End: 2021-09-05
  Administered 2021-09-05: 25 ug/min via INTRAVENOUS

## 2021-09-05 MED ORDER — SODIUM CHLORIDE (PF) 0.9 % IJ SOLN
INTRAMUSCULAR | Status: AC
  Filled 2021-09-05: qty 20

## 2021-09-05 MED ORDER — MIDAZOLAM HCL 2 MG/2ML IJ SOLN
INTRAMUSCULAR | Status: DC | PRN
  Administered 2021-09-05: 2 mg via INTRAVENOUS

## 2021-09-05 MED ORDER — SODIUM CHLORIDE 0.9 % IV SOLN: 500.0000 mL | Freq: Once | INTRAVENOUS | Status: DC | PRN

## 2021-09-05 MED ORDER — CALCIUM ACETATE (PHOS BINDER) 667 MG PO CAPS
1334.0000 mg | ORAL_CAPSULE | Freq: Three times a day (TID) | ORAL | Status: DC
  Administered 2021-09-06 – 2021-09-07 (×2): 1334 mg via ORAL
  Filled 2021-09-05 (×3): qty 2

## 2021-09-05 MED ORDER — ATORVASTATIN CALCIUM 40 MG PO TABS
40.0000 mg | ORAL_TABLET | Freq: Every day | ORAL | Status: DC
  Administered 2021-09-05 – 2021-09-07 (×3): 40 mg via ORAL
  Filled 2021-09-05 (×3): qty 1

## 2021-09-05 MED ORDER — FENTANYL CITRATE (PF) 250 MCG/5ML IJ SOLN
INTRAMUSCULAR | Status: AC
  Filled 2021-09-05: qty 5

## 2021-09-05 SURGICAL SUPPLY — 66 items
ADH SKN CLS APL DERMABOND .7 (GAUZE/BANDAGES/DRESSINGS) ×2
BAG COUNTER SPONGE SURGICOUNT (BAG) ×4 IMPLANT
BAG SPNG CNTER NS LX DISP (BAG) ×2
BANDAGE ESMARK 6X9 LF (GAUZE/BANDAGES/DRESSINGS) IMPLANT
BNDG CMPR 9X6 STRL LF SNTH (GAUZE/BANDAGES/DRESSINGS) ×2
BNDG ESMARK 6X9 LF (GAUZE/BANDAGES/DRESSINGS) ×3
CANISTER SUCT 3000ML PPV (MISCELLANEOUS) ×4 IMPLANT
CANNULA VESSEL 3MM 2 BLNT TIP (CANNULA) ×8 IMPLANT
CATH EMB 5FR 80CM (CATHETERS) ×1 IMPLANT
CLIP TI WIDE RED SMALL 24 (CLIP) ×1 IMPLANT
CLIP VESOCCLUDE MED 24/CT (CLIP) ×4 IMPLANT
CLIP VESOCCLUDE SM WIDE 24/CT (CLIP) ×4 IMPLANT
COVER PROBE W GEL 5X96 (DRAPES) IMPLANT
CUFF TOURN SGL QUICK 24 (TOURNIQUET CUFF) ×3
CUFF TOURN SGL QUICK 34 (TOURNIQUET CUFF)
CUFF TOURN SGL QUICK 42 (TOURNIQUET CUFF) IMPLANT
CUFF TRNQT CYL 24X4X16.5-23 (TOURNIQUET CUFF) IMPLANT
CUFF TRNQT CYL 34X4.125X (TOURNIQUET CUFF) IMPLANT
DERMABOND ADVANCED (GAUZE/BANDAGES/DRESSINGS) ×1
DERMABOND ADVANCED .7 DNX12 (GAUZE/BANDAGES/DRESSINGS) ×3 IMPLANT
DRAIN CHANNEL 15F RND FF W/TCR (WOUND CARE) IMPLANT
DRAPE HALF SHEET 40X57 (DRAPES) IMPLANT
DRAPE X-RAY CASS 24X20 (DRAPES) IMPLANT
ELECT REM PT RETURN 9FT ADLT (ELECTROSURGICAL) ×3
ELECTRODE REM PT RTRN 9FT ADLT (ELECTROSURGICAL) ×3 IMPLANT
EVACUATOR SILICONE 100CC (DRAIN) IMPLANT
GAUZE 4X4 16PLY ~~LOC~~+RFID DBL (SPONGE) ×1 IMPLANT
GLOVE SRG 8 PF TXTR STRL LF DI (GLOVE) ×3 IMPLANT
GLOVE SURG ENC MOIS LTX SZ7.5 (GLOVE) ×4 IMPLANT
GLOVE SURG POLY ORTHO LF SZ7.5 (GLOVE) IMPLANT
GLOVE SURG UNDER LTX SZ8 (GLOVE) ×4 IMPLANT
GLOVE SURG UNDER POLY LF SZ8 (GLOVE) ×3
GOWN STRL REUS W/ TWL LRG LVL3 (GOWN DISPOSABLE) ×9 IMPLANT
GOWN STRL REUS W/TWL LRG LVL3 (GOWN DISPOSABLE) ×9
KIT BASIN OR (CUSTOM PROCEDURE TRAY) ×4 IMPLANT
KIT TURNOVER KIT B (KITS) ×4 IMPLANT
LOOP VESSEL MINI RED (MISCELLANEOUS) ×1 IMPLANT
MARKER GRAFT CORONARY BYPASS (MISCELLANEOUS) IMPLANT
NS IRRIG 1000ML POUR BTL (IV SOLUTION) ×8 IMPLANT
PACK PERIPHERAL VASCULAR (CUSTOM PROCEDURE TRAY) ×4 IMPLANT
PAD ARMBOARD 7.5X6 YLW CONV (MISCELLANEOUS) ×8 IMPLANT
SET COLLECT BLD 21X3/4 12 (NEEDLE) ×1 IMPLANT
SPONGE INTESTINAL PEANUT (DISPOSABLE) ×1 IMPLANT
SPONGE SURGIFOAM ABS GEL 100 (HEMOSTASIS) IMPLANT
SPONGE T-LAP 18X18 ~~LOC~~+RFID (SPONGE) ×1 IMPLANT
STOPCOCK 4 WAY LG BORE MALE ST (IV SETS) ×1 IMPLANT
SUT ETHILON 3 0 PS 1 (SUTURE) IMPLANT
SUT MNCRL AB 4-0 PS2 18 (SUTURE) ×9 IMPLANT
SUT PROLENE 5 0 C 1 24 (SUTURE) ×7 IMPLANT
SUT PROLENE 6 0 BV (SUTURE) ×6 IMPLANT
SUT SILK 2 0 (SUTURE) ×3
SUT SILK 2 0 SH (SUTURE) ×5 IMPLANT
SUT SILK 2-0 18XBRD TIE 12 (SUTURE) IMPLANT
SUT SILK 3 0 (SUTURE) ×6
SUT SILK 3-0 18XBRD TIE 12 (SUTURE) IMPLANT
SUT VIC AB 2-0 CTB1 (SUTURE) ×8 IMPLANT
SUT VIC AB 3-0 SH 27 (SUTURE) ×9
SUT VIC AB 3-0 SH 27X BRD (SUTURE) ×6 IMPLANT
SYR 20ML LL LF (SYRINGE) ×1 IMPLANT
SYR 30ML LL (SYRINGE) IMPLANT
SYR 3ML LL SCALE MARK (SYRINGE) ×1 IMPLANT
TOWEL GREEN STERILE (TOWEL DISPOSABLE) ×4 IMPLANT
TRAY FOLEY MTR SLVR 16FR STAT (SET/KITS/TRAYS/PACK) ×4 IMPLANT
TUBING EXTENTION W/L.L. (IV SETS) IMPLANT
UNDERPAD 30X36 HEAVY ABSORB (UNDERPADS AND DIAPERS) ×4 IMPLANT
WATER STERILE IRR 1000ML POUR (IV SOLUTION) ×4 IMPLANT

## 2021-09-05 NOTE — Anesthesia Postprocedure Evaluation (Signed)
Anesthesia Post Note ? ?Patient: Albert Harris ? ?Procedure(s) Performed: RIGHT FEMORAL-POPLITEAL ARTERY BYPASS WITH Spaphenous Vein (Right: Leg Upper) ?RIGHT Saphenous VEIN HARVEST (Right: Leg Upper) ?ENDARTERECTOMY FEMORAL (Right: Groin) ? ?  ? ?Patient location during evaluation: PACU ?Anesthesia Type: General ?Level of consciousness: awake and alert ?Pain management: pain level controlled ?Vital Signs Assessment: post-procedure vital signs reviewed and stable ?Respiratory status: spontaneous breathing, nonlabored ventilation, respiratory function stable and patient connected to nasal cannula oxygen ?Cardiovascular status: blood pressure returned to baseline and stable ?Postop Assessment: no apparent nausea or vomiting ?Anesthetic complications: no ? ? ?No notable events documented. ? ?Last Vitals:  ?Vitals:  ? 09/05/21 1445 09/05/21 1510  ?BP: (!) 155/74 (!) 160/75  ?Pulse: 83 88  ?Resp: 20 19  ?Temp: 37 ?C 36.7 ?C  ?SpO2: 94% 95%  ?  ?Last Pain:  ?Vitals:  ? 09/05/21 1510  ?TempSrc: Oral  ?PainSc:   ? ? ?  ?  ?  ?  ?  ?  ? ?[name] ? ? ? ? ?

## 2021-09-05 NOTE — Progress Notes (Signed)
? ?  VASCULAR SURGERY POSTOP:  ? ?The patient is doing well postop. ?His right foot is hyperemic with a brisk posterior tibial and anterior tibial signal with the Doppler. ?Change IV heparin (400 units an hour) to use subcu heparin tomorrow morning. ?Renal has been notified and is arranging his dialysis. ? ? ?SUBJECTIVE:  ? ?No complaints. ? ?PHYSICAL EXAM:  ? ?Vitals:  ? 09/05/21 1445 09/05/21 1510 09/05/21 1620 09/05/21 1706  ?BP: (!) 155/74 (!) 160/75 133/72 140/78  ?Pulse: 83 88 92   ?Resp: 20 19 18 20   ?Temp: 98.6 ?F (37 ?C) 98 ?F (36.7 ?C)  98.2 ?F (36.8 ?C)  ?TempSrc:  Oral  Oral  ?SpO2: 94% 95% 91%   ?Weight:      ?Height:      ? ?His right foot is hyperemic. ?He has a brisk posterior tibial and anterior tibial signal with the Doppler. ?All his incisions look fine. ? ?LABS:  ? ?Lab Results  ?Component Value Date  ? WBC 10.2 09/05/2021  ? HGB 10.0 (L) 09/05/2021  ? HCT 31.7 (L) 09/05/2021  ? MCV 106.4 (H) 09/05/2021  ? PLT 143 (L) 09/05/2021  ? ?Lab Results  ?Component Value Date  ? CREATININE 6.90 (H) 09/05/2021  ? ?Lab Results  ?Component Value Date  ? INR 1.3 (H) 09/05/2021  ? ? ?PROBLEM LIST:   ? ?Principal Problem: ?  PAD (peripheral artery disease) ([place]) ? ? ?CURRENT MEDS:  ? ? amLODipine  10 mg Oral Daily  ? [START ON 09/06/2021] amoxicillin-clavulanate  1 tablet Oral Once per day on Tue Thu Sat  ? aspirin EC  81 mg Oral Daily  ? atorvastatin  40 mg Oral Daily  ? [START ON 09/06/2021] calcium acetate  1,334 mg Oral TID WC  ? [START ON 09/06/2021] Chlorhexidine Gluconate Cloth  6 each Topical Q0600  ? [START ON 09/06/2021] docusate sodium  100 mg Oral Daily  ? [START ON 09/06/2021] doxercalciferol  5 mcg Intravenous Q T,Th,Sa-HD  ? [START ON 09/06/2021] feeding supplement (NEPRO CARB STEADY)  237 mL Oral BID BM  ? fentaNYL      ? fentaNYL      ? labetalol      ? metoprolol tartrate  25 mg Oral BID  ? multivitamin  1 tablet Oral QHS  ? oxyCODONE      ? pantoprazole  40 mg Oral Daily  ? ? ?[name] ?Office:  [phone] ?09/05/2021 ? ?

## 2021-09-05 NOTE — H&P (Signed)
VASCULAR SURGERY: ? ?This patient presents with a nonhealing wound on his right great toe.  He has severe infrainguinal arterial occlusive disease with disease in the deep femoral artery and occlusion of the superficial femoral artery and reconstitution of his above-knee popliteal artery which has some mild disease.  He has significant calcific disease in his common femoral artery with an iliac stent just above that.  This is a Viabahn stent. ? ?I have looked at his vein map and he has reasonable vein on the right leg.  I would be reluctant to take vein from the left leg as he has significant infrainguinal arterial occlusive disease on the left. ? ?He may require endarterectomy in the common femoral artery.  I will explore the above-knee popliteal artery if this is not adequate then we will go below the knee.  Preferentially use vein.  I have discussed the indications for the procedure and the potential complications with the patient and he is agreeable to proceed. ? ?[name], MD ?7:33 AM ? ?

## 2021-09-05 NOTE — Anesthesia Procedure Notes (Signed)
Procedure Name: Intubation ?Date/Time: 09/05/2021 7:46 AM ?Performed by: Mariea Clonts, CRNA ?Pre-anesthesia Checklist: Patient identified, Emergency Drugs available, Suction available and Patient being monitored ?Patient Re-evaluated:Patient Re-evaluated prior to induction ?Oxygen Delivery Method: Circle System Utilized ?Preoxygenation: Pre-oxygenation with 100% oxygen ?Induction Type: IV induction ?Ventilation: Mask ventilation without difficulty ?Laryngoscope Size: Sabra Heck and 2 ?Tube type: Oral ?Tube size: 7.5 mm ?Number of attempts: 1 ?Airway Equipment and Method: Stylet and Oral airway ?Placement Confirmation: ETT inserted through vocal cords under direct vision, positive ETCO2 and breath sounds checked- equal and bilateral ?Tube secured with: Tape ?Dental Injury: Teeth and Oropharynx as per pre-operative assessment  ? ? ? ? ?

## 2021-09-05 NOTE — Progress Notes (Signed)
Pt arrived to 4e from PACU. Pt oriented to room and staff. Right DP and PT pulses dopplered. Vitals obtained. Telemetry box applied and CCMD notified. CHG bath done.  ?

## 2021-09-05 NOTE — Consult Note (Addendum)
 [facility] Renal Consultation Note    Indication for Consultation:  Management of ESRD/hemodialysis; anemia, hypertension/volume and secondary hyperparathyroidism PCP:  HPI: Albert Harris is a 66 y.o. male with ESRD on hemodialysis T,Th, S at [facility]. PMH: HTN, PVD, CVA, HFrEF, G2DD, COPD, SHPT, Anemia of ESRD, noncompliance with HD. Last HD 09/04/2021, ran full treatment, got to OP EDW.  He missed week of HD 03/23-03/28/2023. He was admitted today per Dr. Eliza for nonhealing wound of  R great toe. To OR today per Dr. Eliza for intervention.   Seen in room post op. Awake, A & O without complaints. Very HOH. Incision R groin CDI. Has foley catheter-will dc. BP well controlled, no evidence of volume overload by exam. HD tomorrow on schedule.   Past Medical History:  Diagnosis Date   Anemia    Asthma    Carotid stenosis 09/12/2019   CHF (congestive heart failure) (HCC)    Chronic kidney disease    t, th. sat dialysis   Claudication in peripheral vascular disease (HCC) 09/12/2019   Complication of anesthesia     i HAVE A HARD TIME WAKING UP  (only one time)   COPD (chronic obstructive pulmonary disease) (HCC)    COVID-19    positive on  06/16/20   GERD (gastroesophageal reflux disease)    HLD (hyperlipidemia)    Hypertension    Peripheral vascular disease (HCC)    PFO with atrial septal aneurysm    by echo 11/2017   Pneumonia    Stroke [facility])    [name] (transient ischemic attack) 11/2017   Tobacco abuse    quit 07/17/19   Past Surgical History:  Procedure Laterality Date   ABDOMINAL AORTOGRAM W/LOWER EXTREMITY N/A 06/21/2020   Procedure: ABDOMINAL AORTOGRAM W/LOWER EXTREMITY;  Surgeon: Eliza Lonni RAMAN, MD;  Location: [facility] INVASIVE CV LAB;  Service: Cardiovascular;  Laterality: N/A;   ABDOMINAL AORTOGRAM W/LOWER EXTREMITY Right 08/29/2021   Procedure: ABDOMINAL AORTOGRAM W/LOWER EXTREMITY;  Surgeon: Magda Debby SAILOR, MD;  Location: [facility]  INVASIVE CV LAB;  Service: Cardiovascular;  Laterality: Right;   AV FISTULA PLACEMENT Left 03/28/2020   Procedure: ARTERIOVENOUS (AV) FISTULA CREATION LEFT;  Surgeon: Gretta Lonni PARAS, MD;  Location: [facility] OR;  Service: Vascular;  Laterality: Left;   AV FISTULA PLACEMENT Right 09/23/2020   Procedure: RIGHT Arm ARTERIOVENOUS GRAFT CREATION.;  Surgeon: Eliza Lonni RAMAN, MD;  Location: [facility] OR;  Service: Vascular;  Laterality: Right;   COLONOSCOPY     COLONOSCOPY WITH PROPOFOL  N/A 06/27/2020   Procedure: COLONOSCOPY WITH PROPOFOL ;  Surgeon: Rollin Dover, MD;  Location: [facility] ENDOSCOPY;  Service: Endoscopy;  Laterality: N/A;   FINGER SURGERY     HEMOSTASIS CLIP PLACEMENT  06/27/2020   Procedure: HEMOSTASIS CLIP PLACEMENT;  Surgeon: Rollin Dover, MD;  Location: [facility] ENDOSCOPY;  Service: Endoscopy;;   INSERTION OF DIALYSIS CATHETER Right 06/24/2020   Procedure: INSERTION OF TUNNELED  DIALYSIS CATHETER; Removal of temporary dialysis catheter right neck;  Surgeon: Eliza Lonni RAMAN, MD;  Location: [facility] OR;  Service: Vascular;  Laterality: Right;   LIGATION OF ARTERIOVENOUS  FISTULA Left 07/24/2020   Procedure: LIGATION OF LEFT BRACHIOCEPHALIC ARTERIOVENOUS  FISTULA;  Surgeon: Gretta Lonni PARAS, MD;  Location: [facility] OR;  Service: Vascular;  Laterality: Left;   PERIPHERAL VASCULAR INTERVENTION Right 06/21/2020   Procedure: PERIPHERAL VASCULAR INTERVENTION;  Surgeon: Eliza Lonni RAMAN, MD;  Location: [facility] INVASIVE CV LAB;  Service: Cardiovascular;  Laterality: Right;   TEE WITHOUT CARDIOVERSION N/A 05/13/2021  Procedure: TRANSESOPHAGEAL ECHOCARDIOGRAM (TEE);  Surgeon: Kate Lonni CROME, MD;  Location: [facility] ENDOSCOPY;  Service: Cardiovascular;  Laterality: N/A;   Family History  Problem Relation Age of Onset   Hypertension Mother    Colon cancer Father    Stomach cancer Brother    Social History:  reports that he quit smoking about 2 years ago. His smoking use included cigarettes. He has a 50.00  pack-year smoking history. He has never used smokeless tobacco. He reports that he does not currently use alcohol after a past usage of about 1.0 - 3.0 standard drink per week. He reports that he does not use drugs. Allergies  Allergen Reactions   Lidocaine -Prilocaine  Rash    Caused red marks up and down arm   Prior to Admission medications   Medication Sig Start Date End Date Taking? Authorizing Provider  acetaminophen  (TYLENOL ) 500 MG tablet Take 1,000 mg by mouth daily as needed for mild pain.   Yes [provider]  albuterol  (VENTOLIN  HFA) 108 (90 Base) MCG/ACT inhaler INHALE 1-2 PUFFS INTO THE LUNGS EVERY 6 (SIX) HOURS AS NEEDED FOR WHEEZING OR SHORTNESS OF BREATH. Patient taking differently: Inhale 2 puffs into the lungs every 6 (six) hours as needed for wheezing or shortness of breath. 07/02/20 10/11/21 Yes Maness, Aleene, MD  amLODipine  (NORVASC ) 10 MG tablet Take 1 tablet (10 mg total) by mouth daily. 08/30/21  Yes Lou Claretta HERO, MD  amoxicillin -clavulanate (AUGMENTIN ) 500-125 MG tablet Take 1 tablet daily. Take after dialysis on Tuesday, Thursday and Saturday. Patient taking differently: Take 1 tablet by mouth daily. Take after dialysis on Tuesday, Thursday and Saturday. 08/30/21  Yes Lou Claretta HERO, MD  metoprolol  tartrate (LOPRESSOR ) 25 MG tablet TAKE 1 TABLET BY MOUTH TWICE A DAY 07/25/20 10/11/21 Yes Goldsborough, Kellie, MD  oxyCODONE  (ROXICODONE ) 5 MG immediate release tablet Take 1 tablet (5 mg total) by mouth every 8 (eight) hours as needed for up to 10 doses for breakthrough pain. 09/03/21  Yes Curatolo, Adam, DO  aspirin  325 MG EC tablet Take 325 mg by mouth daily as needed for pain.    [provider]  aspirin  EC 81 MG EC tablet Take 1 tablet (81 mg total) by mouth daily. Swallow whole. Patient not taking: Reported on 09/03/2021 08/30/21   Lou Claretta HERO, MD  atorvastatin  (LIPITOR ) 40 MG tablet Take 1 tablet (40 mg total) by mouth daily. 08/30/21 02/26/22   Amponsah, Prosper M, MD  calcium  acetate (PHOSLO ) 667 MG capsule Take 2 capsule by mouth three times a day with meals Patient taking differently: Take 1,334 mg by mouth 3 (three) times daily with meals. 06/17/21     hydrOXYzine  (ATARAX ) 10 MG tablet Take 1 tablet (10 mg total) by mouth 3 (three) times daily as needed. Patient not taking: Reported on 08/28/2021 06/18/21   Celestia Rosaline SQUIBB, NP   Current Facility-Administered Medications  Medication Dose Route Frequency Provider Last Rate Last Admin   acetaminophen  (OFIRMEV ) 10 MG/ML IV            acetaminophen  (OFIRMEV ) IV 1,000 mg  1,000 mg Intravenous Once PRN Hollis, Kevin D, MD   Stopped at 09/05/21 1319   acetaminophen  (TYLENOL ) tablet 325-650 mg  325-650 mg Oral Q4H PRN Hollis, Kevin D, MD       Or   acetaminophen  (TYLENOL ) 160 MG/5ML solution 325-650 mg  325-650 mg Oral Q4H PRN Hollis, Kevin D, MD       amisulpride  (BARHEMSYS ) injection 10 mg  10 mg Intravenous  Once PRN Hollis, Kevin D, MD       fentaNYL  (SUBLIMAZE ) 100 MCG/2ML injection            fentaNYL  (SUBLIMAZE ) 100 MCG/2ML injection            fentaNYL  (SUBLIMAZE ) injection 25-50 mcg  25-50 mcg Intravenous Q5 min PRN Hollis, Kevin D, MD   50 mcg at 09/05/21 1335   heparin  25000 UT/250ML infusion            heparin  ADULT infusion 100 units/mL (25000 units/250mL)  400 Units/hr Intravenous Continuous Rhyne, Samantha J, PA-C 4 mL/hr at 09/05/21 1242 400 Units/hr at 09/05/21 1242   labetalol  (NORMODYNE ) 5 MG/ML injection            oxyCODONE  (Oxy IR/ROXICODONE ) immediate release tablet 5 mg  5 mg Oral Once PRN Hollis, Kevin D, MD       Or   oxyCODONE  (ROXICODONE ) 5 MG/5ML solution 5 mg  5 mg Oral Once PRN Tilford Franky BIRCH, MD       Labs: Basic Metabolic Panel: Recent Labs  Lab 08/30/21 0124 09/03/21 0401 09/05/21 0714  NA 137 137 138  K 4.5 4.3 3.9  CL 98 96* 93*  CO2 26 23  --   GLUCOSE 84 104* 91  BUN 40* 53* 27*  CREATININE 7.10* 11.28* 6.90*  CALCIUM  8.9 9.4  --    PHOS 6.1*  --   --    Liver Function Tests: Recent Labs  Lab 08/30/21 0124 09/03/21 0401  AST  --  17  ALT  --  23  ALKPHOS  --  100  BILITOT  --  1.5*  PROT  --  7.1  ALBUMIN  3.2* 3.3*   Recent Labs  Lab 09/03/21 0401  LIPASE 26   No results for input(s): AMMONIA in the last 168 hours. CBC: Recent Labs  Lab 08/29/21 1823 08/30/21 0124 09/03/21 0401 09/05/21 0714  WBC 10.2 10.1 9.9  --   NEUTROABS  --   --  8.0*  --   HGB 10.0* 10.6* 10.7* 11.9*  HCT 31.7* 33.1* 33.0* 35.0*  MCV 105.3* 106.4* 105.1*  --   PLT 152 151 153  --    Cardiac Enzymes: No results for input(s): CKTOTAL, CKMB, CKMBINDEX, TROPONINI in the last 168 hours. CBG: No results for input(s): GLUCAP in the last 168 hours. Iron  Studies: No results for input(s): IRON , TIBC, TRANSFERRIN, FERRITIN in the last 72 hours. Studies/Results: DG ANGIO 1V EXT UNI RIGHT  Result Date: 09/05/2021 CLINICAL DATA:  Right lower extremity bypass grafting. EXAM: RIGHT ANG/EXT/UNI/ OR COMPARISON:  None. FINDINGS: Intraoperative image of the right lower leg demonstrates an opacified distal femoral to popliteal bypass graft with patent anastomosis to the popliteal artery just below the knee joint. Distal anastomosis is widely patent and patent anterior tibial and posterior tibial artery runoff is demonstrated to the level of the ankle. The peroneal artery is open but becomes attenuated in the distal calf. IMPRESSION: Patent right femoral to popliteal bypass graft with open anterior tibial and posterior tibial runoff. The peroneal artery becomes attenuated in the distal calf. Electronically Signed   By: Marcey Moan M.D.   On: 09/05/2021 13:17    ROS: As per HPI otherwise negative.  Physical Exam: Vitals:   09/05/21 1247 09/05/21 1300 09/05/21 1315 09/05/21 1330  BP: (!) 169/79 (!) 146/76 (!) 150/80 (!) 150/75  Pulse: 84 85 70 71  Resp: (!) 23 19 18 18   Temp:  TempSrc:      SpO2: 96% 100% 96% 99%  Weight:       Height:         General: Chronically ill appearing male in no acute distress. Head: Normocephalic, atraumatic, sclera non-icteric, mucus membranes are moist Neck: Supple. JVD not elevated. Very HOH Lungs: Clear bilaterally to auscultation without wheezes, rales, or rhonchi. Breathing is unlabored. Heart: RRR with S1 S2. No murmurs, rubs, or gallops appreciated. SR on monitor.  Abdomen: Soft, non-tender, non-distended with normoactive bowel sounds. No rebound/guarding. No obvious abdominal masses. Foley patent to gravity drain, no UOP.  Lower extremities: No LE edema. Incision R groin CDI, Drsg R great toe.  Neuro: Alert and oriented X 3. Moves all extremities spontaneously. Psych:  Responds to questions appropriately with a normal affect. Dialysis Access: R AVG + T/B  Dialysis Orders: [facility] T,Th,S 4 hrs 180NRe 350/800 71.5 kg 2.0 K/2.0 Ca UF Profile 2 AVG -No heparin   -Mircera 75 mcg IV q 2 weeks (last dose 09/04/2021) -Hectorol  5 mcg IV TIW  NOTE: PATIENT IS ALLERGIC TO LIDOCAINE /PRILOCAINE  CREAM-DO NOT USE ON AVG!    Assessment/Plan:  Non healing ulcer R Great Toe: S/P Right iliofemoral endarterectomy , Right femoral to below-knee popliteal artery bypass today per Dr. Eliza. Per [facility]  ESRD -  T,Th,S HD tomorrow on schedule. No heparin .   Hypertension/volume  - BP slightly elevated. Resume home medications. No evidence of volume excess by exam. Wt 72.1 today. UF as tolerated in HD tomorrow.   Anemia  - HGB 10.0. Recent ESA dose. Follow HGB.   Metabolic bone disease -  Continue Calcium  acetate binders, VDRA. Last OP labs at goal. Check RFP with HD tomorrow.   Nutrition - Renal diet with fluid restrictions, nepro, renal vitamins   Zairah Arista H. Delores, NP-C 09/05/2021, 2:02 PM  [employer] [phone]

## 2021-09-05 NOTE — Transfer of Care (Signed)
Immediate Anesthesia Transfer of Care Note ? ?Patient: Albert Harris ? ?Procedure(s) Performed: RIGHT FEMORAL-POPLITEAL ARTERY BYPASS WITH Spaphenous Vein (Right: Leg Upper) ?RIGHT Saphenous VEIN HARVEST (Right: Leg Upper) ?ENDARTERECTOMY FEMORAL (Right: Groin) ? ?Patient Location: PACU ? ?Anesthesia Type:General ? ?Level of Consciousness: awake, alert  and oriented ? ?Airway & Oxygen Therapy: Patient Spontanous Breathing and Patient connected to face mask oxygen ? ?Post-op Assessment: Report given to RN, Post -op Vital signs reviewed and stable and Patient moving all extremities X 4 ? ?Post vital signs: Reviewed and stable ? ?Last Vitals:  ?Vitals Value Taken Time  ?BP 166/80 09/05/21 1232  ?Temp    ?Pulse 86 09/05/21 1238  ?Resp 25 09/05/21 1238  ?SpO2 97 % 09/05/21 1238  ?Vitals shown include unvalidated device data. ? ?Last Pain:  ?Vitals:  ? 09/05/21 0652  ?TempSrc:   ?PainSc: 0-No pain  ?   ? ?  ? ?Complications: No notable events documented. ?

## 2021-09-05 NOTE — Op Note (Signed)
? ? ?NAME: Albert Harris    MRN: [id number] ?DOB: 1955-10-31    DATE OF OPERATION: 09/05/2021 ? ?PREOP DIAGNOSIS:   ? ?Critical limb ischemia right lower extremity ? ?POSTOP DIAGNOSIS:   ? ?Same ? ?PROCEDURE:  ?  ?Right iliofemoral endarterectomy  ?Profundoplasty ?Right femoral to below-knee popliteal artery bypass with nonreversed translocated saphenous vein graft ?Intraoperative arteriogram ? ?SURGEON: Judeth Cornfield. Scot Dock, MD ? ?ASSIST: Leontine Locket, PA ? ?ANESTHESIA: General ? ?EBL: 100 cc ? ?INDICATIONS:  ? ? Albert Harris is a 66 y.o. male who presented with a nonhealing wound of the right great toe.  He is undergone previous bilateral common iliac artery stents and a right external iliac artery stent.  He underwent an arteriogram which showed a superficial femoral artery occlusion and diffuse calcific disease in his common femoral artery.  He had reconstitution of his popliteal artery with mild tibial disease. ? ?FINDINGS:  ? ?Excellent posterior tibial and peroneal signal at the completion of the procedure. ? ?TECHNIQUE:  ? ?The patient was taken to the operating room and received a general anesthetic.  I did look at the right great saphenous vein myself with the SonoSite and he had a fairly large vein throughout.  The right leg was prepped and draped in the usual sterile fashion. ? 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  However given that he had critical limb ischemia I did not think this is something that we can address prior to proceeding with surgery. ? ?A longitudinal incision was made in the right groin and dissection carried down to the common femoral artery which was markedly calcified.  I dissected well under the inguinal ligament and control multiple side branches in order to find an area of the external iliac artery that I felt that I could clamp that was below the Viabahn stent  that was in the right external iliac artery.  Distally the profunda was controlled including several branches.  The superficial femoral artery was chronically occluded. ? ?Using 4 additional incisions along the medial aspect of the right leg the great saphenous vein was harvested to the mid calf.  Branches were divided tween clips and 3-0 silk ties.  Through the incision just below the knee the gastrocnemius muscle was retracted posteriorly and I exposed the popliteal vein which was retracted posteriorly allowing exposure of the below-knee popliteal artery.  A tunnel was then created from this incision to the groin.  The patient was heparinized. ? ?Attention was first turned to the right groin.  The external iliac artery was clamped in the deep femoral artery and multiple branches were controlled with Vesseloops.  A longitudinal arteriotomy was made in the common femoral artery and this was extended up into the external iliac artery.  An endarterectomy plane was established and a large calcific plaque was endarterectomized.  There was a reasonable endpoint proximally and I had good inflow.  Distally the superficial femoral artery was chronically occluded I did endarterectomized the proximal segment which could potentially be used in the future for an endovascular option.  Deep femoral artery was endarterectomized and there was good backbleeding.  The vein was then removed in its entirety.  The saphenous vein was taken off the femoral vein and the femoral vein oversewn with a 5-0 Prolene suture.  The vein was opened longitudinally used in a nonreversed fashion.  Proximal valve  was removed and then the more distal valve was sharply excised.  The vein was sewn into side to the common femoral artery and external iliac artery using running 5-0 Prolene suture.  Prior to completing the anastomosis the arteries were backbled and flushed appropriately and the anastomosis completed.  Next I used a retrograde Mills valvulotome  to lyse the valves.  This was passed twice and excellent flow established to the vein which was then flushed with heparinized saline and clipped distally.  It was marked and prevent twisting.  It was brought through the previously created tunnel for anastomosis to the below-knee popliteal artery. ? ?Tourniquet was placed on the thigh and the leg exsanguinated with an Esmarch bandage.  The tourniquet was inflated to 300 mmHg.  Under tourniquet control, a longitudinal arteriotomy was made in the below-knee popliteal artery.  The vein graft cut to the appropriate length spatulated and sewn into side to the below-knee popliteal artery using continuous 6-0 Prolene suture.  Prior to completing the anastomosis the tourniquet was released.  The artery was backbled and flushed appropriately and the anastomosis completed.  Next a completion arteriogram was obtained by cannulating the vein graft proximally.  This showed a patent bypass graft and distal anastomosis with no technical problems.  Runoff appears to be the posterior tibial and anterior tibial artery although the anterior tibial artery runs fairly far laterally.  There is no continuation onto the foot as the dorsalis pedis artery. ? ?The heparin was partially reversed with protamine.  Each of the vein harvest incisions was closed with a deep layer 3-0 Vicryl and the skin closed with 4-0 Monocryl.  I did place a radiopaque marker around the proximal anastomosis.  Hemostasis was obtained here and this wound was closed with 2 deep layers of 2-0 Vicryl, a subcutaneous layer with 3-0 Vicryl and the skin closed with 4-0 Monocryl.  The distal incision was closed with 4-0 Monocryl.  Dermabond was applied. ? ?Patient tolerated the procedure well and was transferred to the recovery room in stable condition.  He had an excellent posterior tibial and anterior tibial signal above the ankle.  All needle and sponge counts were correct. ? ? ?Given the complexity of the case,  the  assistant was necessary in order to expedient the procedure and safely perform the technical aspects of the operation.  The assistant provided traction and countertraction to assist with exposure of the artery proximally and distally.  They assisted with suture ligature of multiple branches.  Their assistance was critical in the performance of both the proximal and distal anastomosis.These skills, especially following the Prolene suture for the anastomosis, could not have been adequately performed by a scrub tech assistant.  ? ? ?[name], MD, FACS ?[facility] ? ?DATE OF DICTATION:   09/05/2021 ? ?

## 2021-09-06 ENCOUNTER — Encounter (HOSPITAL_COMMUNITY): Payer: Self-pay | Admitting: Vascular Surgery

## 2021-09-06 LAB — CBC
HCT: 27.4 % — ABNORMAL LOW (ref 39.0–52.0)
Hemoglobin: 8.6 g/dL — ABNORMAL LOW (ref 13.0–17.0)
MCH: 33.6 pg (ref 26.0–34.0)
MCHC: 31.4 g/dL (ref 30.0–36.0)
MCV: 107 fL — ABNORMAL HIGH (ref 80.0–100.0)
Platelets: 160 10*3/uL (ref 150–400)
RBC: 2.56 MIL/uL — ABNORMAL LOW (ref 4.22–5.81)
RDW: 17.2 % — ABNORMAL HIGH (ref 11.5–15.5)
WBC: 8.2 10*3/uL (ref 4.0–10.5)
nRBC: 0 % (ref 0.0–0.2)

## 2021-09-06 LAB — LIPID PANEL
Cholesterol: 117 mg/dL (ref 0–200)
HDL: 43 mg/dL (ref >40)
LDL Cholesterol: 63 mg/dL (ref 0–99)
Total CHOL/HDL Ratio: 2.7 RATIO
Triglycerides: 55 mg/dL (ref <150)
VLDL: 11 mg/dL (ref 0–40)

## 2021-09-06 LAB — BASIC METABOLIC PANEL
Anion gap: 15 (ref 5–15)
BUN: 35 mg/dL — ABNORMAL HIGH (ref 8–23)
CO2: 26 mmol/L (ref 22–32)
Calcium: 8 mg/dL — ABNORMAL LOW (ref 8.9–10.3)
Chloride: 95 mmol/L — ABNORMAL LOW (ref 98–111)
Creatinine, Ser: 7.82 mg/dL — ABNORMAL HIGH (ref 0.61–1.24)
GFR, Estimated: 7 mL/min — ABNORMAL LOW (ref >60)
Glucose, Bld: 146 mg/dL — ABNORMAL HIGH (ref 70–99)
Potassium: 4.6 mmol/L (ref 3.5–5.1)
Sodium: 136 mmol/L (ref 135–145)

## 2021-09-06 LAB — GLUCOSE, CAPILLARY: Glucose-Capillary: 166 mg/dL — ABNORMAL HIGH (ref 70–99)

## 2021-09-06 LAB — HEPARIN LEVEL (UNFRACTIONATED): Heparin Unfractionated: <0.1 IU/mL — ABNORMAL LOW (ref 0.30–0.70)

## 2021-09-06 LAB — APTT: aPTT: 35 seconds (ref 24–36)

## 2021-09-06 NOTE — Procedures (Signed)
I was present at this dialysis session. I have reviewed the session itself and made appropriate changes.  ? ?2K bath, 2L UF using AVG. Pt tolerating well. NO c/o. BPs stable.  Hb 8.6, not surprising, CTM.  ? ?Filed Weights  ? 09/05/21 0617 09/06/21 0745 09/06/21 0805  ?Weight: 72.1 kg 70.6 kg 70.6 kg  ? ? ?Recent Labs  ?Lab 09/06/21 ?0155  ?NA 136  ?K 4.6  ?CL 95*  ?CO2 26  ?GLUCOSE 146*  ?BUN 35*  ?CREATININE 7.82*  ?CALCIUM 8.0*  ? ? ?Recent Labs  ?Lab 09/03/21 ?0401 09/05/21 ?7564 09/05/21 ?1519 09/06/21 ?0155  ?WBC 9.9  --  10.2 8.2  ?NEUTROABS 8.0*  --   --   --   ?HGB 10.7* 11.9* 10.0* 8.6*  ?HCT 33.0* 35.0* 31.7* 27.4*  ?MCV 105.1*  --  106.4* 107.0*  ?PLT 153  --  143* 160  ? ? ?Scheduled Meds: ? amLODipine  10 mg Oral Daily  ? amoxicillin-clavulanate  1 tablet Oral Once per day on Tue Thu Sat  ? aspirin EC  81 mg Oral Daily  ? atorvastatin  40 mg Oral Daily  ? calcium acetate  1,334 mg Oral TID WC  ? Chlorhexidine Gluconate Cloth  6 each Topical Q0600  ? docusate sodium  100 mg Oral Daily  ? doxercalciferol  5 mcg Intravenous Q T,Th,Sa-HD  ? feeding supplement (NEPRO CARB STEADY)  237 mL Oral BID BM  ? metoprolol tartrate  25 mg Oral BID  ? multivitamin  1 tablet Oral QHS  ? pantoprazole  40 mg Oral Daily  ? ?Continuous Infusions: ? sodium chloride    ? heparin 400 Units/hr (09/05/21 1242)  ? magnesium sulfate bolus IVPB    ? ?PRN Meds:.sodium chloride, acetaminophen **OR** acetaminophen, albuterol, alum & mag hydroxide-simeth, bisacodyl, guaiFENesin-dextromethorphan, hydrALAZINE, HYDROmorphone (DILAUDID) injection, hydrOXYzine, labetalol, magnesium sulfate bolus IVPB, metoprolol tartrate, ondansetron, oxyCODONE-acetaminophen, phenol, polyethylene glycol   ?Pearson Grippe  MD ?09/06/2021, 9:45 AM ?  ?

## 2021-09-06 NOTE — Progress Notes (Signed)
Report given to dialysis RN

## 2021-09-06 NOTE — Evaluation (Signed)
Occupational Therapy Evaluation ?Patient Details ?Name: Albert Harris ?MRN: [id number] ?DOB: [birth year] ?Today's Date: 09/06/2021 ? ? ?History of Present Illness 66 y.o. male presents to ED via EMS 3/29 with c/o LLQ abdominal pain and recent hx of DVT Also has a nonhealing wound on his right great toe after dropping jar of jam on it. s/p 3/31 R CFA endarterectomy and femoral to popliteal bypass with vein PMH: ESRD on hemodialysis, severe PAD, hypertension hyperlipidemia and HFrEF.  ? ?Clinical Impression ?  ?Patient admitted to the diagnosis and procedure above.  PTA he lives at home with his wife, cares for his ADL, drives on non dialysis days, and walked without an AD.  Pain and decreased activity tolerance are the deficits.  Currently he is needing up to [facility] for in room mobility and ADL completion from a sit/stand level.    ?   ? ?Recommendations for follow up therapy are one component of a multi-disciplinary discharge planning process, led by the attending physician.  Recommendations may be updated based on patient status, additional functional criteria and insurance authorization.  ? ?Follow Up Recommendations ? No OT follow up  ?  ?Assistance Recommended at Discharge Set up Supervision/Assistance  ?Patient can return home with the following   ? ?  ?Functional Status Assessment ? Patient has had a recent decline in their functional status and demonstrates the ability to make significant improvements in function in a reasonable and predictable amount of time.  ?Equipment Recommendations ? None recommended by OT  ?  ?Recommendations for Other Services   ? ? ?  ?Precautions / Restrictions Precautions ?Precautions: Fall ?Precaution Comments: hx of falls ?Restrictions ?Weight Bearing Restrictions: No  ? ?  ? ?Mobility Bed Mobility ?Overal bed mobility: Modified Independent ?  ?  ?  ?  ?  ?  ?  ?  ? ?Transfers ?Overall transfer level: Needs assistance ?Equipment used: None ?Transfers: Sit to/from Stand ?Sit to  Stand: Min guard ?  ?  ?  ?  ?  ?  ?  ? ?  ?Balance Overall balance assessment: Needs assistance ?Sitting-balance support: Feet supported, No upper extremity supported ?Sitting balance-Leahy Scale: Good ?  ?  ?Standing balance support: During functional activity ?Standing balance-Leahy Scale: Fair ?  ?  ?  ?  ?  ?  ?  ?  ?  ?  ?  ?  ?   ? ?ADL either performed or assessed with clinical judgement  ? ?ADL   ?  ?  ?  ?  ?  ?  ?  ?  ?  ?  ?  ?  ?  ?  ?  ?  ?  ?  ?  ?General ADL Comments: Up to [employer] when standing  ? ? ? ?Vision Baseline Vision/History: 1 Wears glasses ?Patient Visual Report: No change from baseline ?   ?   ?Perception Perception ?Perception: Within Functional Limits ?  ?Praxis Praxis ?Praxis: Intact ?  ? ?Pertinent Vitals/Pain Pain Assessment ?Pain Assessment: Faces ?Faces Pain Scale: Hurts whole lot ?Pain Location: R foot ?Pain Descriptors / Indicators: Throbbing, Burning ?Pain Intervention(s): Monitored during session  ? ? ? ?Hand Dominance Right ?  ?Extremity/Trunk Assessment Upper Extremity Assessment ?Upper Extremity Assessment: Overall WFL for tasks assessed ?  ?Lower Extremity Assessment ?Lower Extremity Assessment: Defer to PT evaluation ?RLE Deficits / Details: R great toe and posterior calf tightness limited formal assessment, however with ambulation knee ROM slightly limited ?RLE: Unable to fully  assess due to pain ?RLE Sensation: decreased light touch (below ankle, numbness in dependent position) ?RLE Coordination: decreased fine motor ?LLE Deficits / Details: Overall WFL ?  ?Cervical / Trunk Assessment ?Cervical / Trunk Assessment: Normal ?  ?Communication Communication ?Communication: HOH ?  ?Cognition Arousal/Alertness: Awake/alert ?Behavior During Therapy: Flat affect, Impulsive ?Overall Cognitive Status: Impaired/Different from baseline ?  ?  ?  ?  ?  ?  ?  ?  ?  ?Orientation Level: Disoriented to, Time ?  ?Memory: Decreased short-term memory ?  ?Safety/Judgement: Decreased  awareness of deficits, Decreased awareness of safety ?  ?Problem Solving: Slow processing ?  ?  ?  ?General Comments  HR 80-90s, SpO2 low 90s with talking, otherwise in mid 90s on RA ? ?  ?Exercises   ?  ?Shoulder Instructions    ? ? ?Home Living Family/patient expects to be discharged to:: Private residence ?Living Arrangements: Spouse/significant other ?Available Help at Discharge: Family;Available 24 hours/day ?Type of Home: House ?Home Access: Stairs to enter ?Entrance Stairs-Number of Steps: 3 ?Entrance Stairs-Rails: Right ?Home Layout: Laundry or work area in basement;Two level ?  ?  ?Bathroom Shower/Tub: Tub/shower unit;Other (comment) (Walk in tub with HH shower) ?  ?Bathroom Toilet: Standard ?Bathroom Accessibility: Yes ?How Accessible: Accessible via walker ?Home Equipment: Conservation officer, nature (2 wheels);Wheelchair - manual;Grab bars - tub/shower;Grab bars - toilet;Hand held shower head ?  ?  ?  ? ?  ?Prior Functioning/Environment Prior Level of Function : Needs assist ?  ?  ?  ?  ?  ?  ?Mobility Comments: ambulates without AD ?ADLs Comments: independent ADLs, limited IADLs, drive, spouse assists with meds ?  ? ?  ?  ?OT Problem List: Decreased activity tolerance;Pain;Impaired balance (sitting and/or standing) ?  ?   ?OT Treatment/Interventions: Self-care/ADL training;Patient/family education;Balance training;Therapeutic activities  ?  ?OT Goals(Current goals can be found in the care plan section) Acute Rehab OT Goals ?Patient Stated Goal: Return home ?OT Goal Formulation: With patient ?Time For Goal Achievement: 09/20/21 ?Potential to Achieve Goals: Good ?ADL Goals ?Pt Will Perform Grooming: Independently;standing ?Pt Will Perform Lower Body Dressing: Independently;sit to/from stand ?Pt Will Transfer to Toilet: Independently;ambulating;regular height toilet  ?OT Frequency: Min 2X/week ?  ? ?Co-evaluation PT/OT/SLP Co-Evaluation/Treatment: Yes ?Reason for Co-Treatment: For patient/therapist safety ?PT goals  addressed during session: Mobility/safety with mobility ?OT goals addressed during session: ADL's and self-care ?  ? ?  ?AM-PAC OT "6 Clicks" Daily Activity     ?Outcome Measure Help from another person eating meals?: None ?Help from another person taking care of personal grooming?: None ?Help from another person toileting, which includes using toliet, bedpan, or urinal?: A Little ?Help from another person bathing (including washing, rinsing, drying)?: A Little ?Help from another person to put on and taking off regular upper body clothing?: A Little ?Help from another person to put on and taking off regular lower body clothing?: A Little ?6 Click Score: 20 ?  ?End of Session Nurse Communication: Mobility status ? ?Activity Tolerance: Patient tolerated treatment well ?Patient left: in chair;with call bell/phone within reach;with chair alarm set ? ?OT Visit Diagnosis: Unsteadiness on feet (R26.81);Pain ?Pain - Right/Left: Right ?Pain - part of body: Ankle and joints of foot  ?              ?Time: 1430-1450 ?OT Time Calculation (min): 20 min ?Charges:  OT General Charges ?$OT Visit: 1 Visit ?OT Evaluation ?$OT Eval Moderate Complexity: 1 Mod ? ?09/06/2021 ? ?RP, OTR/L ? ?Acute Rehabilitation  Services ? ?Office:  [phone] ? ? ?Izaih Kataoka D Ella Guillotte ?09/06/2021, 3:43 PM ?

## 2021-09-06 NOTE — Progress Notes (Addendum)
?  Progress Note ? ? ? ?09/06/2021 ?10:45 AM ?1 Day Post-Op ? ?Subjective:  seen on HD this morning.  No complaints ? ? ?Vitals:  ? 09/06/21 0930 09/06/21 1000  ?BP: 137/73 127/65  ?Pulse: 76 78  ?Resp: 16 15  ?Temp:    ?SpO2:    ? ?Physical Exam: ?Lungs:  non labored ?Incisions:  RLE incisions c/d/i ?Extremities:  R foot warm with good cap refill ?Neurologic: A&O ? ?CBC ?   ?Component Value Date/Time  ? WBC 8.2 09/06/2021 0155  ? RBC 2.56 (L) 09/06/2021 0155  ? HGB 8.6 (L) 09/06/2021 0155  ? HGB 10.4 (L) 06/18/2021 1431  ? HCT 27.4 (L) 09/06/2021 0155  ? HCT 31.6 (L) 06/18/2021 1431  ? PLT 160 09/06/2021 0155  ? PLT 142 (L) 06/18/2021 1431  ? MCV 107.0 (H) 09/06/2021 0155  ? MCV 96 06/18/2021 1431  ? MCH 33.6 09/06/2021 0155  ? MCHC 31.4 09/06/2021 0155  ? RDW 17.2 (H) 09/06/2021 0155  ? RDW 14.3 06/18/2021 1431  ? LYMPHSABS 0.9 09/03/2021 0401  ? LYMPHSABS 1.5 06/18/2021 1431  ? MONOABS 0.8 09/03/2021 0401  ? EOSABS 0.1 09/03/2021 0401  ? EOSABS 1.0 (H) 06/18/2021 1431  ? BASOSABS 0.1 09/03/2021 0401  ? BASOSABS 0.1 06/18/2021 1431  ? ? ?BMET ?   ?Component Value Date/Time  ? NA 136 09/06/2021 0155  ? NA 141 06/18/2021 1431  ? K 4.6 09/06/2021 0155  ? CL 95 (L) 09/06/2021 0155  ? CO2 26 09/06/2021 0155  ? GLUCOSE 146 (H) 09/06/2021 0155  ? BUN 35 (H) 09/06/2021 0155  ? BUN 20 06/18/2021 1431  ? CREATININE 7.82 (H) 09/06/2021 0155  ? CREATININE 1.15 02/19/2017 1049  ? CALCIUM 8.0 (L) 09/06/2021 0155  ? CALCIUM 8.5 (L) 06/10/2020 0900  ? GFRNONAA 7 (L) 09/06/2021 0155  ? GFRAA 6 (L) 03/04/2020 1045  ? ? ?INR ?   ?Component Value Date/Time  ? INR 1.3 (H) 09/05/2021 0017  ? ? ? ?Intake/Output Summary (Last 24 hours) at 09/06/2021 1045 ?Last data filed at 09/06/2021 0400 ?Gross per 24 hour  ?Intake 2137.87 ml  ?Output 45 ml  ?Net 2092.87 ml  ? ? ? ?Assessment/Plan:  66 y.o. male is s/p R CFA endarterectomy and femoral to popliteal bypass with vein 1 Day Post-Op  ? ?R foot warm and well perfused on exam; we will allow toe to  demarcate ?OOB today ?Home when mobility improved and pain controlled ? ?Dagoberto Ligas, PA-C ?[facility] ?[phone] ?09/06/2021 ?10:45 AM ? ? ?I have seen and evaluated the patient.  I agree with the plan as outlined by the PA above. ? ?Annamarie Major ?

## 2021-09-06 NOTE — Progress Notes (Signed)
PT Cancellation Note ? ?Patient Details ?Name: Albert Harris ?MRN: [id number] ?DOB: 10/20/1955 ? ? ?Cancelled Treatment:    Reason Eval/Treat Not Completed: (P) Patient at procedure or test/unavailable Pt is off the floor for HD. PT will follow back for Eval this afternoon.  ? ?Anamari Galeas B. Migdalia Dk PT, DPT ?Acute Rehabilitation Services ?Pager [phone] ?Office [phone] ? ? ? ?[place] ?09/06/2021, 8:07 AM ? ? ?

## 2021-09-06 NOTE — Progress Notes (Signed)
PHARMACIST LIPID MONITORING ? ? ?Albert Harris is a 66 y.o. male admitted on 09/05/2021 with nonhealing toe wound 2/2 ischemia.  Pharmacy has been consulted to optimize lipid-lowering therapy with the indication of secondary prevention for clinical ASCVD. ? ?Recent Labs: ? ?Lipid Panel (last 6 months):   ?Lab Results  ?Component Value Date  ? CHOL 117 09/06/2021  ? TRIG 55 09/06/2021  ? HDL 43 09/06/2021  ? CHOLHDL 2.7 09/06/2021  ? VLDL 11 09/06/2021  ? [place] 63 09/06/2021  ? ? ?Hepatic function panel (last 6 months):   ?Lab Results  ?Component Value Date  ? AST 17 09/03/2021  ? ALT 23 09/03/2021  ? ALKPHOS 100 09/03/2021  ? BILITOT 1.5 (H) 09/03/2021  ? ? ?SCr (since admission):   ?Serum creatinine: 7.82 mg/dL (H) 09/06/21 0155 ?Estimated creatinine clearance: 8.9 mL/min (A) ? ?Current therapy and lipid therapy tolerance ?Current lipid-lowering therapy: atorvastatin 40 mg ?Previous lipid-lowering therapies (if applicable): none ?Documented or reported allergies or intolerances to lipid-lowering therapies (if applicable): na ? ?Assessment:   ?Patient agrees with changes to lipid-lowering therapy ? ?Plan:   ? ?1.Statin intensity (high intensity recommended for all patients regardless of the LDL):  Add or increase statin to high intensity. ? ?2.Add ezetimibe (if any one of the following):   Not indicated at this time. ? ?3.Refer to lipid clinic:   No ? ?4.Follow-up with:  Primary care provider - Kerin Perna, NP ? ?5.Follow-up labs after discharge:  Changes in lipid therapy were made. Check a lipid panel in 8-12 weeks then annually.    ? ?Laurey Arrow, PharmD ?PGY1 Pharmacy Resident ?09/07/2021  9:57 AM ? ?Please check [email or web address] for unit-specific pharmacy phone numbers.\ ?

## 2021-09-06 NOTE — Evaluation (Signed)
Physical Therapy Evaluation ?Patient Details ?Name: Albert Harris ?MRN: [id number] ?DOB: 01-20-1956 ?Today's Date: 09/06/2021 ? ?History of Present Illness ? 66 y.o. male presents to ED via EMS 3/29 with c/o LLQ abdominal pain and recent hx of DVT Also has a nonhealing wound on his right great toe after dropping jar of jam on it. s/p 3/31 R CFA endarterectomy and femoral to popliteal bypass with vein PMH: ESRD on hemodialysis, severe PAD, hypertension hyperlipidemia and HFrEF.  ?Clinical Impression ? PTA pt living with wife in 2 story home with bed and bath on first floor and 3 steps to enter. Pt reports ambulation limited community distances without AD, driving and performing ADLs, wife assists with medication management. Pt is currently limited in safe mobility by decreased cognition in particular safety awareness, in presence of R foot and LE pain s/p endarterectomy and associated limited ROM in R knee and foot. Pt is mod I for bed mobility, min guard for transfers and contact guard assist for ambulation without AD. PT recommending HHPT at discharge, and working with mobility team acutely. PT will continue to follow acutely. ?   ? ?Recommendations for follow up therapy are one component of a multi-disciplinary discharge planning process, led by the attending physician.  Recommendations may be updated based on patient status, additional functional criteria and insurance authorization. ? ?Follow Up Recommendations Home health PT ? ?  ?Assistance Recommended at Discharge Frequent or constant Supervision/Assistance  ?Patient can return home with the following ? A little help with walking and/or transfers;A little help with bathing/dressing/bathroom;Assistance with cooking/housework;Direct supervision/assist for medications management;Direct supervision/assist for financial management;Assist for transportation;Help with stairs or ramp for entrance ? ?  ?Equipment Recommendations None recommended by PT  ?   ?Functional  Status Assessment Patient has had a recent decline in their functional status and demonstrates the ability to make significant improvements in function in a reasonable and predictable amount of time.  ? ?  ?Precautions / Restrictions Precautions ?Precautions: Fall ?Precaution Comments: hx of falls ?Restrictions ?Weight Bearing Restrictions: No  ? ?  ? ?Mobility ? Bed Mobility ?Overal bed mobility: Modified Independent ?  ?  ?  ?  ?  ?  ?General bed mobility comments: use of rails to come to EoB, reports numbness in R foot in dependent position ?  ? ?Transfers ?Overall transfer level: Needs assistance ?Equipment used: None ?Transfers: Sit to/from Stand ?Sit to Stand: Min guard ?  ?  ?  ?  ?  ?General transfer comment: min guard for ?  ? ?Ambulation/Gait ?Ambulation/Gait assistance: Min assist ?Gait Distance (Feet): 100 Feet ?Assistive device: None ?Gait Pattern/deviations: Step-through pattern, Decreased step length - right, Decreased step length - left, Shuffle, Drifts right/left ?Gait velocity: slowed ?Gait velocity interpretation: 1.31 - 2.62 ft/sec, indicative of limited community ambulator ?  ?General Gait Details: contact guard assist for mildly unsteady gait, ambulates on lateral side of R foot, with slightly decreased knee flexion in R, ? ?  ? ?  ? ?Balance Overall balance assessment: Needs assistance ?Sitting-balance support: Feet supported, No upper extremity supported ?Sitting balance-Leahy Scale: Good ?  ?  ?Standing balance support: During functional activity ?Standing balance-Leahy Scale: Fair ?  ?  ?  ?  ?  ?  ?  ?  ?  ?  ?  ?  ?   ? ? ? ?Pertinent Vitals/Pain Pain Assessment ?Pain Assessment: 0-10 ?Pain Score: 10-Worst pain ever ?Pain Location: R foot ?Pain Descriptors / Indicators: Throbbing, Burning ?Pain Intervention(s): Limited  activity within patient's tolerance, Monitored during session, Patient requesting pain meds-RN notified, RN gave pain meds during session, Repositioned  ? ? ?Home Living  Family/patient expects to be discharged to:: Private residence ?Living Arrangements: Spouse/significant other ?Available Help at Discharge: Family;Available 24 hours/day ?Type of Home: House ?Home Access: Stairs to enter ?Entrance Stairs-Rails: Right ?Entrance Stairs-Number of Steps: 3 ?  ?Home Layout: Laundry or work area in basement;Two level ?Home Equipment: Conservation officer, nature (2 wheels);Wheelchair - manual;Grab bars - tub/shower;Grab bars - toilet;Hand held shower head ?   ?  ?Prior Function Prior Level of Function : Needs assist ?  ?  ?  ?  ?  ?  ?Mobility Comments: ambulates without AD ?ADLs Comments: independent ADLs, limited IADLs, drive, spouse assists with meds ?  ? ? ?Hand Dominance  ? Dominant Hand: Right (ambidextrious) ? ?  ?Extremity/Trunk Assessment  ? Upper Extremity Assessment ?Upper Extremity Assessment: Defer to OT evaluation ?  ? ?Lower Extremity Assessment ?Lower Extremity Assessment: RLE deficits/detail;LLE deficits/detail ?RLE Deficits / Details: R great toe and posterior calf tightness limited formal assessment, however with ambulation knee ROM slightly limited ?RLE: Unable to fully assess due to pain ?RLE Sensation: decreased light touch (below ankle, numbness in dependent position) ?RLE Coordination: decreased fine motor ?LLE Deficits / Details: Overall WFL ?  ? ?Cervical / Trunk Assessment ?Cervical / Trunk Assessment: Normal  ?Communication  ? Communication: HOH  ?Cognition Arousal/Alertness: Awake/alert ?Behavior During Therapy: Flat affect, Impulsive ?Overall Cognitive Status: Impaired/Different from baseline ?Area of Impairment: Memory, Orientation, Problem solving, Following commands, Safety/judgement ?  ?  ?  ?  ?  ?  ?  ?  ?Orientation Level: Disoriented to, Time ?  ?Memory: Decreased short-term memory ?Following Commands: Follows multi-step commands inconsistently ?Safety/Judgement: Decreased awareness of deficits, Decreased awareness of safety ?  ?Problem Solving: Slow processing,  Difficulty sequencing, Requires verbal cues, Requires tactile cues ?General Comments: Disoriented to date, has STM deficits, as he starts to ask for something when therapist leaving room but then can't remember what he was asking for ?  ?  ? ?  ?General Comments General comments (skin integrity, edema, etc.): HR 80-90s, SpO2 low 90s with talking, otherwise in mid 90s on RA ? ?  ?   ? ?Assessment/Plan  ?  ?PT Assessment Patient needs continued PT services  ?PT Problem List Decreased strength;Decreased range of motion ? ?   ?  ?PT Treatment Interventions DME instruction;Gait training;Stair training;Functional mobility training;Therapeutic activities;Therapeutic exercise;Balance training;Cognitive remediation;Patient/family education   ? ?PT Goals (Current goals can be found in the Care Plan section)  ?Acute Rehab PT Goals ?Patient Stated Goal: go home soon ?PT Goal Formulation: With patient ?Time For Goal Achievement: 09/20/21 ?Potential to Achieve Goals: Good ? ?  ?Frequency Min 3X/week ?  ? ? ?Co-evaluation PT/OT/SLP Co-Evaluation/Treatment: Yes ?Reason for Co-Treatment: For patient/therapist safety ?PT goals addressed during session: Mobility/safety with mobility ?  ?  ? ? ?  ?AM-PAC PT "6 Clicks" Mobility  ?Outcome Measure Help needed turning from your back to your side while in a flat bed without using bedrails?: None ?Help needed moving from lying on your back to sitting on the side of a flat bed without using bedrails?: A Little ?Help needed moving to and from a bed to a chair (including a wheelchair)?: A Little ?Help needed standing up from a chair using your arms (e.g., wheelchair or bedside chair)?: A Little ?Help needed to walk in hospital room?: A Little ?Help needed climbing 3-5 steps with  a railing? : A Lot ?6 Click Score: 18 ? ?  ?End of Session   ?Activity Tolerance: Patient tolerated treatment well ?Patient left: in chair;with call bell/phone within reach;with chair alarm set ?Nurse Communication:  Patient requests pain meds;Mobility status ?PT Visit Diagnosis: Other abnormalities of gait and mobility (R26.89);Unsteadiness on feet (R26.81);Muscle weakness (generalized) (M62.81);Difficulty in walking, no

## 2021-09-06 NOTE — Progress Notes (Signed)
?  09/06/21 1210  ?Vitals  ?Temp 98.6 ?F (37 ?C)  ?Temp Source Oral  ?BP (!) 164/77  ?BP Location Left Arm  ?BP Method Automatic  ?Patient Position (if appropriate) Lying  ?Pulse Rate 81  ?Pulse Rate Source Monitor  ?Resp 20  ?Oxygen Therapy  ?SpO2 96 %  ?O2 Device Room Air  ?Dialysis Weight  ?Weight 68.4 kg  ?Type of Weight Post-Dialysis  ?Post-Hemodialysis Assessment  ?Rinseback Volume (mL) 250 mL  ?KECN 259 V  ?Dialyzer Clearance Lightly streaked  ?Duration of HD Treatment -hour(s) 4 hour(s)  ?Hemodialysis Intake (mL) 500 mL  ?UF Total -Machine (mL) 2500 mL  ?Net UF (mL) 2000 mL  ?Tolerated HD Treatment Yes  ?Post-Hemodialysis Comments tx complete, pt stable  ?AVG/AVF Arterial Site Held (minutes) 5 minutes  ?AVG/AVF Venous Site Held (minutes) 5 minutes  ?Fistula / Graft Right Forearm  ?No placement date or time found.   Placed prior to admission: Yes  Orientation: Right  Access Location: Forearm  ?Site Condition No complications  ?Fistula / Graft Assessment Present;Thrill;Bruit  ?Status Deaccessed  ? ?HD tx complete, pt stable. UF goal met. Pt noted with infiltration to AV graft site, swelling noted. Ice pack applied. Pt able to be recannulated and dialysis resumed. Samantha Collins-PA made aware.  ?

## 2021-09-07 LAB — APTT: aPTT: 30 seconds (ref 24–36)

## 2021-09-07 MED ORDER — ATORVASTATIN CALCIUM 80 MG PO TABS: 80.0000 mg | ORAL_TABLET | Freq: Every day | ORAL | Status: DC

## 2021-09-07 MED ORDER — ATORVASTATIN CALCIUM 40 MG PO TABS
80.0000 mg | ORAL_TABLET | Freq: Every day | ORAL | 5 refills | Status: DC
Start: 2021-09-07 — End: 2021-12-26

## 2021-09-07 MED ORDER — OXYCODONE-ACETAMINOPHEN 5-325 MG PO TABS: 1.0000 | ORAL_TABLET | Freq: Four times a day (QID) | ORAL | 0 refills | Status: DC | PRN

## 2021-09-07 NOTE — TOC Initial Note (Signed)
Transition of Care (TOC) - Initial/Assessment Note  ? ? ?Patient Details  ?Name: Albert Harris ?MRN: [id number] ?Date of Birth: 12-Apr-1956 ? ?Transition of Care (TOC) CM/SW Contact:    ?Norvel Wenker G., RN ?Phone Number: ?09/07/2021, 9:22 AM ? ?Clinical Narrative:      ?           This patient presents with a nonhealing wound on his right great toe.  RNCM spoke with patient to offer choice for HHPT.  Patient declined HHPT and stated he would follow up with his provider if he needed anything. ? ?Expected Discharge Plan: Home/Self Care ?Barriers to Discharge: No Barriers Identified ? ?Patient Goals and CMS Choice ?Patient states their goals for this hospitalization and ongoing recovery are:: to be able to return home ?  ?Choice offered to / list presented to : NA ? ?Expected Discharge Plan and Services ?Expected Discharge Plan: Home/Self Care ?  ?Discharge Planning Services: CM Consult ?Post Acute Care Choice: NA ?Living arrangements for the past 2 months: [place] ?Expected Discharge Date: 09/07/21               ?DME Arranged: N/A ?  ?  ?  ?  ?HH Arranged: Refused HH ?[place] Agency: NA ?  ?  ?  ? ?Prior Living Arrangements/Services ?Living arrangements for the past 2 months: [place] ?Lives with:: Self ?Patient language and need for interpreter reviewed:: Yes ?Do you feel safe going back to the place where you live?: Yes      ?Need for Family Participation in Patient Care: No (Comment) ?Care giver support system in place?: Yes (comment) ?  ?Criminal Activity/Legal Involvement Pertinent to Current Situation/Hospitalization: No - Comment as needed ? ?Activities of Daily Living ?Home Assistive Devices/Equipment: Blood pressure cuff, Eyeglasses ?ADL Screening (condition at time of admission) ?Patient's cognitive ability adequate to safely complete daily activities?: Yes ?Is the patient deaf or have difficulty hearing?: No ?Does the patient have difficulty seeing, even when wearing glasses/contacts?: No ?Does  the patient have difficulty concentrating, remembering, or making decisions?: No ?Patient able to express need for assistance with ADLs?: Yes ?Does the patient have difficulty dressing or bathing?: No ?Independently performs ADLs?: Yes (appropriate for developmental age) ?Does the patient have difficulty walking or climbing stairs?: No ?Weakness of Legs: Both ?Weakness of Arms/Hands: None ? ?Permission Sought/Granted ?  ?  ?   ?   ?   ?   ? ?Emotional Assessment ?Appearance:: Appears stated age ?Attitude/Demeanor/Rapport: Gracious ?Affect (typically observed): Accepting ?Orientation: : Oriented to Self, Oriented to Place, Oriented to  Time, Oriented to Situation ?  ?Psych Involvement: No (comment) ? ?Admission diagnosis:  PAD (peripheral artery disease) ([place]) [I73.9] ?Patient Active Problem List  ? Diagnosis Date Noted  ? PAD (peripheral artery disease) ([place]) 09/05/2021  ? Critical limb ischemia of right lower extremity ([place])   ? Open wound of right great toe 08/28/2021  ? Bacteremia due to Gram-positive bacteria 05/09/2021  ? Acute metabolic encephalopathy 26/37/8588  ? Hyperkalemia 05/05/2021  ? Elevated LFTs 05/05/2021  ? Hypoglycemia 05/05/2021  ? Thrombocytopenia ([place]) 05/05/2021  ? Fall at home, initial encounter 05/05/2021  ? Goals of care, counseling/discussion   ? Abdominal swelling   ? Acute on chronic diastolic CHF (congestive heart failure) ([place]) 12/15/2020  ? SOB (shortness of breath) 12/15/2020  ? Hypertension   ? COPD (chronic obstructive pulmonary disease) ([place])   ? Volume overload 12/14/2020  ? ESRD (end stage renal disease) ([place])   ?  Lower GI bleed   ? Pressure injury of skin 06/28/2020  ? Acute renal failure superimposed on stage 5 chronic kidney disease, not on chronic dialysis ([place])   ? Acute respiratory disease due to COVID-19 virus 06/16/2020  ? CKD (chronic kidney disease) stage 5, GFR less than 15 ml/min (HCC) 03/19/2020  ? Claudication in peripheral vascular disease ([place]) 09/12/2019  ?  Carotid stenosis 09/12/2019  ? PFO (patent foramen ovale) 08/10/2019  ? Noncompliance 08/10/2019  ? Hx of transient ischemic attack ([name]) 08/10/2019  ? Chronic diastolic heart failure ([place])   ? Elevated troponin   ? Hypertensive emergency   ? Acute respiratory failure with hypoxia ([place]) 07/18/2019  ? Acute pulmonary edema ([place]) 07/18/2019  ? ARF (acute renal failure) ([place]) 07/18/2019  ? Macrocytic anemia 07/18/2019  ? Right sided weakness 11/09/2017  ? CKD (chronic kidney disease), stage III ([place]) 11/09/2017  ? Tobacco abuse 01/19/2017  ? Hypertensive urgency 12/24/2016  ? [name] (transient ischemic attack) 12/24/2016  ? ?PCP:  Kerin Perna, NP ?Pharmacy:   ?[facility] [address] [employer], [address] ?[place] [address] ?Phone: [phone] Fax: [phone] ? ?Social Determinants of Health (SDOH) Interventions ?  ? ?Readmission Risk Interventions ? ?  05/07/2021  ?  2:27 PM 12/19/2020  ? 11:55 AM  ?Readmission Risk Prevention Plan  ?Transportation Screening Complete Complete  ?PCP or Specialist Appt within 3-5 Days  Complete  ?[place] or Home Care Consult  Complete  ?Social Work Consult for [place] Planning/Counseling  Complete  ?Palliative Care Screening  Complete  ?Medication Review Press photographer) Complete Complete  ?PCP or Specialist appointment within 3-5 days of discharge Complete   ?[place] or Home Care Consult Complete   ?SW Recovery Care/Counseling Consult Patient refused   ?Palliative Care Screening Not Applicable   ?[place] Not Applicable   ? ? ? ?

## 2021-09-07 NOTE — Discharge Instructions (Signed)
 [facility]  Discharge instructions  Lower Extremity Bypass Surgery  Please refer to the following instruction for your post-procedure care. Your surgeon or physician assistant will discuss any changes with you.  Activity  You are encouraged to walk as much as you can. You can slowly return to normal activities during the month after your surgery. Avoid strenuous activity and heavy lifting until your doctor tells you it's OK. Avoid activities such as vacuuming or swinging a golf club. Do not drive until your doctor give the OK and you are no longer taking prescription pain medications. It is also normal to have difficulty with sleep habits, eating and bowel movement after surgery. These will go away with time.  Bathing/Showering  You may shower after you go home. Do not soak in a bathtub, hot tub, or swim until the incision heals completely.  Incision Care  Clean your incision with mild soap and water. Shower every day. Pat the area dry with a clean towel. You do not need a bandage unless otherwise instructed. Do not apply any ointments or creams to your incision. If you have open wounds you will be instructed how to care for them or a visiting nurse may be arranged for you. If you have staples or sutures along your incision they will be removed at your post-op appointment. You may have skin glue on your incision. Do not peel it off. It will come off on its own in about one week. If you have a great deal of moisture in your groin, use a gauze help keep this area dry.  Diet  Resume your normal diet. There are no special food restrictions following this procedure. A low fat/ low cholesterol diet is recommended for all patients with vascular disease. In order to heal from your surgery, it is CRITICAL to get adequate nutrition. Your body requires vitamins, minerals, and protein. Vegetables are the best source of vitamins and minerals. Vegetables also provide the  perfect balance of protein. Processed food has little nutritional value, so try to avoid this.  Medications  Resume taking all your medications unless your doctor or nurse practitioner tells you not to. If your incision is causing pain, you may take over-the-counter pain relievers such as acetaminophen (Tylenol). If you were prescribed a stronger pain medication, please aware these medication can cause nausea and constipation. Prevent nausea by taking the medication with a snack or meal. Avoid constipation by drinking plenty of fluids and eating foods with high amount of fiber, such as fruits, vegetables, and grains. Take Colase 100 mg (an over-the-counter stool softener) twice a day as needed for constipation. Do not take Tylenol if you are taking prescription pain medications.  Follow Up  Our office will schedule a follow up appointment 2-3 weeks following discharge.  Please call us immediately for any of the following conditions  Severe or worsening pain in your legs or feet while at rest or while walking Increase pain, redness, warmth, or drainage (pus) from your incision site(s) Fever of 101 degree or higher The swelling in your leg with the bypass suddenly worsens and becomes more painful than when you were in the hospital If you have been instructed to feel your graft pulse then you should do so every day. If you can no longer feel this pulse, call the office immediately. Not all patients are given this instruction.  Leg swelling is common after leg bypass surgery.  The swelling should improve over a few months   following surgery. To improve the swelling, you may elevate your legs above the level of your heart while you are sitting or resting. Your surgeon or physician assistant may ask you to apply an ACE wrap or wear compression (TED) stockings to help to reduce swelling.  Reduce your risk of vascular disease  Stop smoking. If you would like help call [facility] at 1-800-QUIT-NOW  (1-800-784-8669) or [place] at 336-586-4000.  Manage your cholesterol Maintain a desired weight Control your diabetes weight Control your diabetes Keep your blood pressure down  If you have any questions, please call the office at [phone]   

## 2021-09-07 NOTE — Progress Notes (Signed)
Mobility Specialist Progress Note  ? ? 09/07/21 1022  ?Mobility  ?Activity Ambulated independently in hallway  ?Level of Assistance Modified independent, requires aide device or extra time  ?Assistive Device  ?(IV pole)  ?Distance Ambulated (ft) 230 ft  ?Activity Response Tolerated well  ?$Mobility charge 1 Mobility  ? ?Pt received in bed and agreeable. No complaints on walk. Returned to chair with call bell in reach.   ? ?Albert Harris ?Mobility Specialist  ?  ?

## 2021-09-07 NOTE — Plan of Care (Signed)
  Problem: Education: Goal: Knowledge of General Education information will improve Description: Including pain rating scale, medication(s)/side effects and non-pharmacologic comfort measures Outcome: Adequate for Discharge   Problem: Health Behavior/Discharge Planning: Goal: Ability to manage health-related needs will improve Outcome: Adequate for Discharge   Problem: Clinical Measurements: Goal: Ability to maintain clinical measurements within normal limits will improve Outcome: Adequate for Discharge Goal: Will remain free from infection Outcome: Adequate for Discharge Goal: Diagnostic test results will improve Outcome: Adequate for Discharge Goal: Respiratory complications will improve Outcome: Adequate for Discharge Goal: Cardiovascular complication will be avoided Outcome: Adequate for Discharge   Problem: Activity: Goal: Risk for activity intolerance will decrease Outcome: Adequate for Discharge   Problem: Nutrition: Goal: Adequate nutrition will be maintained Outcome: Adequate for Discharge   Problem: Coping: Goal: Level of anxiety will decrease Outcome: Adequate for Discharge   Problem: Elimination: Goal: Will not experience complications related to bowel motility Outcome: Adequate for Discharge Goal: Will not experience complications related to urinary retention Outcome: Adequate for Discharge   Problem: Pain Managment: Goal: General experience of comfort will improve Outcome: Adequate for Discharge   Problem: Safety: Goal: Ability to remain free from injury will improve Outcome: Adequate for Discharge   Problem: Skin Integrity: Goal: Risk for impaired skin integrity will decrease Outcome: Adequate for Discharge   Problem: Acute Rehab PT Goals(only PT should resolve) Goal: Patient Will Transfer Sit To/From Stand Outcome: Adequate for Discharge Goal: Pt Will Ambulate Outcome: Adequate for Discharge Goal: Pt Will Go Up/Down Stairs Outcome: Adequate for  Discharge   Problem: Acute Rehab OT Goals (only OT should resolve) Goal: Pt. Will Perform Grooming Outcome: Adequate for Discharge Goal: Pt. Will Perform Lower Body Dressing Outcome: Adequate for Discharge Goal: Pt. Will Transfer To Toilet Outcome: Adequate for Discharge   

## 2021-09-07 NOTE — Progress Notes (Signed)
?[facility] ?Progress Note  ? ?Subjective:   Feeling well, was able to walk which he is happy about. Hoping to go home soon. Denies SOB, CP, abdominal pain and nausea.  ? ?Objective ?Vitals:  ? 09/06/21 1928 09/07/21 0003 09/07/21 0340 09/07/21 0749  ?BP: 117/64 126/78 135/81 (!) 146/84  ?Pulse: 80 76 78 (!) 155  ?Resp: 18 16 19 15   ?Temp: 98.4 ?F (36.9 ?C) 98.2 ?F (36.8 ?C) 98.3 ?F (36.8 ?C) 97.9 ?F (36.6 ?C)  ?TempSrc: Oral Oral Oral Axillary  ?SpO2: 91% 92% 98% 96%  ?Weight:      ?Height:      ? ?Physical Exam ?General: WDWN male, alert and in NAD ?Heart: RRR, no murmurs, rubs or gallops ?Lungs: CTA bilaterally without wheezing, rhonchi or rales ?Abdomen: Soft, +BS ?Extremities: No edema b/l lower extremities ?Dialysis Access: RUE AVG + bruit ? ?Additional Objective ?Labs: ?Basic Metabolic Panel: ?Recent Labs  ?Lab 09/03/21 ?0401 09/05/21 ?6789 09/06/21 ?0155  ?NA 137 138 136  ?K 4.3 3.9 4.6  ?CL 96* 93* 95*  ?CO2 23  --  26  ?GLUCOSE 104* 91 146*  ?BUN 53* 27* 35*  ?CREATININE 11.28* 6.90* 7.82*  ?CALCIUM 9.4  --  8.0*  ? ?Liver Function Tests: ?Recent Labs  ?Lab 09/03/21 ?0401  ?AST 17  ?ALT 23  ?ALKPHOS 100  ?BILITOT 1.5*  ?PROT 7.1  ?ALBUMIN 3.3*  ? ?Recent Labs  ?Lab 09/03/21 ?0401  ?LIPASE 26  ? ?CBC: ?Recent Labs  ?Lab 09/03/21 ?0401 09/05/21 ?3810 09/05/21 ?1519 09/06/21 ?0155  ?WBC 9.9  --  10.2 8.2  ?NEUTROABS 8.0*  --   --   --   ?HGB 10.7* 11.9* 10.0* 8.6*  ?HCT 33.0* 35.0* 31.7* 27.4*  ?MCV 105.1*  --  106.4* 107.0*  ?PLT 153  --  143* 160  ? ?Blood Culture ?   ?Component Value Date/Time  ? SDES BLOOD LEFT HAND 05/10/2021 0612  ? SPECREQUEST  05/10/2021 0612  ?  AEROBIC BOTTLE ONLY Blood Culture results may not be optimal due to an inadequate volume of blood received in culture bottles  ? CULT  05/10/2021 0612  ?  NO GROWTH 5 DAYS ?Performed at [facility], [place] [address], [place], [place] [address] ?  ? REPTSTATUS 05/15/2021 FINAL 05/10/2021 0612  ? ? ?Cardiac Enzymes: ?No  results for input(s): CKTOTAL, CKMB, CKMBINDEX, TROPONINI in the last 168 hours. ?CBG: ?Recent Labs  ?Lab 09/06/21 ?1407  ?GLUCAP 166*  ? ?Iron Studies: No results for input(s): IRON, TIBC, TRANSFERRIN, FERRITIN in the last 72 hours. ?@lablastinr3 @ ?Studies/Results: ?DG ANGIO 1V EXT UNI RIGHT ? ?Result Date: 09/05/2021 ?CLINICAL DATA:  Right lower extremity bypass grafting. EXAM: RIGHT ANG/EXT/UNI/ OR COMPARISON:  None. FINDINGS: Intraoperative image of the right lower leg demonstrates an opacified distal femoral to popliteal bypass graft with patent anastomosis to the popliteal artery just below the knee joint. Distal anastomosis is widely patent and patent anterior tibial and posterior tibial artery runoff is demonstrated to the level of the ankle. The peroneal artery is open but becomes attenuated in the distal calf. IMPRESSION: Patent right femoral to popliteal bypass graft with open anterior tibial and posterior tibial runoff. The peroneal artery becomes attenuated in the distal calf. Electronically Signed   By: Aletta Edouard M.D.   On: 09/05/2021 13:17   ?Medications: ? sodium chloride    ? heparin 400 Units/hr (09/07/21 0500)  ? magnesium sulfate bolus IVPB    ? ? amLODipine  10  mg Oral Daily  ? amoxicillin-clavulanate  1 tablet Oral Once per day on Tue Thu Sat  ? aspirin EC  81 mg Oral Daily  ? [START ON 09/08/2021] atorvastatin  80 mg Oral Daily  ? calcium acetate  1,334 mg Oral TID WC  ? docusate sodium  100 mg Oral Daily  ? doxercalciferol  5 mcg Intravenous Q T,Th,Sa-HD  ? feeding supplement (NEPRO CARB STEADY)  237 mL Oral BID BM  ? metoprolol tartrate  25 mg Oral BID  ? multivitamin  1 tablet Oral QHS  ? pantoprazole  40 mg Oral Daily  ? ? ?Dialysis Orders: ?[facility] T,Th,S ?4 hrs 180NRe 350/800 71.5 kg 2.0 K/2.0 Ca UF Profile 2 AVG ?-No heparin  ?-Mircera 75 mcg IV q 2 weeks (last dose 09/04/2021) ?-Hectorol 5 mcg IV TIW ? ?Assessment/Plan: ? Non healing ulcer R Great Toe: S/P Right iliofemoral  endarterectomy , Right femoral to below-knee popliteal artery bypass per Dr. Scot Dock. Per [facility] ? ESRD -  Continue TTS schedule. No heparin.  ? Hypertension/volume  - BP improved on home meds. No evidence of volume excess by exam. No volume overload on exam. Below EDW today, likely needs to be lowered at discharge.  ? Anemia  - HGB 8.6- post op decline as expected. Will increase ESA dose at discharge.   ? Metabolic bone disease -  Calcium at goal. Continue Calcium acetate binders, VDRA.  ? Nutrition - Renal diet with fluid restrictions, nepro, renal vitamins ? ?Anice Paganini, PA-C ?09/07/2021, 10:40 AM  ?[facility] ?Pager: [phone] ? ? ?

## 2021-09-07 NOTE — Progress Notes (Addendum)
?  Progress Note ? ? ? ?09/07/2021 ?8:30 AM ?2 Days Post-Op ? ?Subjective:  R foot feels better compared to before surgery ? ? ?Vitals:  ? 09/07/21 0340 09/07/21 0749  ?BP: 135/81 (!) 146/84  ?Pulse: 78 (!) 155  ?Resp: 19 15  ?Temp: 98.3 ?F (36.8 ?C) 97.9 ?F (36.6 ?C)  ?SpO2: 98% 96%  ? ?Physical Exam ?Lungs:  non labored ?Incisions:  R leg incisions c/d/i ?Extremities:  brisk R DP/PT by doppler ?Neurologic: A&O ? ?CBC ?   ?Component Value Date/Time  ? WBC 8.2 09/06/2021 0155  ? RBC 2.56 (L) 09/06/2021 0155  ? HGB 8.6 (L) 09/06/2021 0155  ? HGB 10.4 (L) 06/18/2021 1431  ? HCT 27.4 (L) 09/06/2021 0155  ? HCT 31.6 (L) 06/18/2021 1431  ? PLT 160 09/06/2021 0155  ? PLT 142 (L) 06/18/2021 1431  ? MCV 107.0 (H) 09/06/2021 0155  ? MCV 96 06/18/2021 1431  ? MCH 33.6 09/06/2021 0155  ? MCHC 31.4 09/06/2021 0155  ? RDW 17.2 (H) 09/06/2021 0155  ? RDW 14.3 06/18/2021 1431  ? LYMPHSABS 0.9 09/03/2021 0401  ? LYMPHSABS 1.5 06/18/2021 1431  ? MONOABS 0.8 09/03/2021 0401  ? EOSABS 0.1 09/03/2021 0401  ? EOSABS 1.0 (H) 06/18/2021 1431  ? BASOSABS 0.1 09/03/2021 0401  ? BASOSABS 0.1 06/18/2021 1431  ? ? ?BMET ?   ?Component Value Date/Time  ? NA 136 09/06/2021 0155  ? NA 141 06/18/2021 1431  ? K 4.6 09/06/2021 0155  ? CL 95 (L) 09/06/2021 0155  ? CO2 26 09/06/2021 0155  ? GLUCOSE 146 (H) 09/06/2021 0155  ? BUN 35 (H) 09/06/2021 0155  ? BUN 20 06/18/2021 1431  ? CREATININE 7.82 (H) 09/06/2021 0155  ? CREATININE 1.15 02/19/2017 1049  ? CALCIUM 8.0 (L) 09/06/2021 0155  ? CALCIUM 8.5 (L) 06/10/2020 0900  ? GFRNONAA 7 (L) 09/06/2021 0155  ? GFRAA 6 (L) 03/04/2020 1045  ? ? ?INR ?   ?Component Value Date/Time  ? INR 1.3 (H) 09/05/2021 1601  ? ? ? ?Intake/Output Summary (Last 24 hours) at 09/07/2021 0830 ?Last data filed at 09/07/2021 0500 ?Gross per 24 hour  ?Intake 580 ml  ?Output 2400 ml  ?Net -1820 ml  ? ? ? ?Assessment/Plan:  66 y.o. male is s/p R CFA endarterectomy and femoral to BK pop bypass 2 Days Post-Op  ? ?R foot well perfused by  doppler exam ?RLE incisions are well appearing ?R toe wound is dry and healing well ?Ok for discharge home once [facility] PT has been arranged ? ? ? ?Dagoberto Ligas, PA-C ?[facility] ?[phone] ?09/07/2021 ?8:30 AM ? ?I agree with the above.  I have seen and evaluated the patient.  He will work with PT today.  He wants to go home which I think is appropriate.  His incisions are healing nicely.  He ambulated in the halls yesterday.  He has brisk pedal Doppler signal. ? ?Annamarie Major ? ?

## 2021-09-08 ENCOUNTER — Telehealth: Payer: Self-pay

## 2021-09-08 NOTE — Telephone Encounter (Signed)
Transition Care Management Follow-up Telephone Call ?Date of discharge and from where: 09/07/2021, [facility]  ?How have you been since you were released from the hospital? He is doing okay.  ?Any questions or concerns? No ? ?Items Reviewed: ?Did the pt receive and understand the discharge instructions provided? Yes  ?Medications obtained and verified? Yes  - he said he has all medications and did not have any questions about the med regime  ?Other? No  ?Any new allergies since your discharge? No  ?Dietary orders reviewed? Yes ?Do you have support at home? Yes , his wife.  ? ?Home Care and Equipment/Supplies: ?Were home health services ordered? no ?If so, what is the name of the agency? N/a  ?Has the agency set up a time to come to the patient's home? not applicable ?Were any new equipment or medical supplies ordered?  No ?What is the name of the medical supply agency? N/a ?Were you able to get the supplies/equipment? not applicable ?Do you have any questions related to the use of the equipment or supplies? No ? ?Attends HD: T/T/S - [place]  ? ?Functional Questionnaire: (I = Independent and D = Dependent) ?ADLs: independent.  Has cane and walker but said he doesn't need to use them.  ? ?Follow up appointments reviewed: ? ?PCP Hospital f/u appt confirmed?  He said he will call to schedule an appointment   ?[facility] f/u appt confirmed?  He still needs an appointment with [facility].   ?Are transportation arrangements needed? No  ?If their condition worsens, is the pt aware to call PCP or go to the Emergency Dept.? Yes ?Was the patient provided with contact information for the PCP's office or ED? Yes ?Was to pt encouraged to call back with questions or concerns? Yes ? ?

## 2021-09-08 NOTE — TOC Transition Note (Signed)
Transition of care contact from inpatient facility ? ?Date of discharge: 09/07/21 ?Date of contact: 09/08/21 ?Method: Attempted Phone Call ?Spoke to: No Answer ? ?Patient contacted to discuss transition of care from recent inpatient hospitalization but patient did not pick up the phone. A voicemail was left to call the [facility] HD unit [phone] for any questions/concerns ? ?Patient will follow up with his outpatient HD unit on: Tuesday April 4th at [facility]. ? ?Tobie Poet, NP ?  ?

## 2021-09-08 NOTE — Discharge Summary (Signed)
?Bypass Discharge Summary ?Patient ID: ?[place] ?[id number] ?66 y.o. ?Aug 16, 1955 ? ?Admit date: 09/05/2021 ? ?Discharge date and time: 09/07/2021  2:43 PM  ? ?Admitting Physician: Angelia Mould, MD  ? ?Discharge Physician: Dr. Trula Slade ? ?Admission Diagnoses: PAD (peripheral artery disease) ([place]) [I73.9] ? ?Discharge Diagnoses: same ? ?Admission Condition: fair ? ?Discharged Condition: fair ? ?Indication for Admission: critical limb ischemia ? ?Consults: nephrology ? ?Treatments: surgery: Right common femoral artery endarterectomy and femoral to below the knee popliteal bypass with vein by Dr. Scot Dock on 09/05/2021.  Patient tolerated the procedure well and was admitted to the hospital postoperatively.  Nephrology was consulted for management of end-stage renal disease on hemodialysis during hospital stay.  Over the next several days he increase his mobility and had better pain control postoperative pain.  At the time of discharge he had a brisk DP and PT signal by Doppler in his right foot.  Right great toe wound also appeared to be healing.  Patient will follow-up in office in about 2 to 3 weeks.  He was prescribed 2 to 3 days of narcotic pain medicine for continued postoperative pain control.  He was discharged home in stable condition. ? ? ? ?Disposition: Discharge disposition: 01-Home or Self Care ? ? ? ? ? ? ?- For [facility] use --- ? ?Post-op:  ?Wound infection: No  ?Graft infection: No  ?Transfusion: No  ?New Arrhythmia: No ?Patency judged by: [ x] Dopper only, [ ]  Palpable graft pulse, [ ]  Palpable distal pulse, [ ]  ABI inc. > 0.15, [ ]  Duplex ?D/C Ambulatory Status: Ambulatory ? ?Complications: ?MI: [x ] No, [ ]  Troponin only, [ ]  EKG or Clinical ?CHF: No ?Resp failure: [x ] none, [ ]  Pneumonia, [ ]  Ventilator ?Chg in renal function: [ x] none, [ ]  Inc. Cr > 0.5, [ ]  Temp. Dialysis, [ ]  Permanent dialysis ?Stroke: [ x] None, [ ]  Minor, [ ]  Major ?Return to OR: No  ?Reason for return to OR: [ ]   Bleeding, [ ]  Infection, [ ]  Thrombosis, [ ]  Revision ? ?Discharge medications: ?Statin use:  Yes ?ASA use:  Yes ?Plavix use:  No  for medical reason not indicated ?Beta blocker use: Yes ?Coumadin use: No  for medical reason not indicated ? ? ? ?Patient Instructions:  ?Allergies as of 09/07/2021   ? ?   Reactions  ? Lidocaine-prilocaine Rash  ? Caused red marks up and down arm  ? ?  ? ?  ?Medication List  ?  ? ?TAKE these medications   ? ?acetaminophen 500 MG tablet ?Commonly known as: TYLENOL ?Take 1,000 mg by mouth daily as needed for mild pain. ?  ?albuterol 108 (90 Base) MCG/ACT inhaler ?Commonly known as: VENTOLIN HFA ?INHALE 1-2 PUFFS INTO THE LUNGS EVERY 6 (SIX) HOURS AS NEEDED FOR WHEEZING OR SHORTNESS OF BREATH. ?What changed: how much to take ?  ?amLODipine 10 MG tablet ?Commonly known as: NORVASC ?Take 1 tablet (10 mg total) by mouth daily. ?  ?amoxicillin-clavulanate 500-125 MG tablet ?Commonly known as: Augmentin ?Take 1 tablet daily. Take after dialysis on Tuesday, Thursday and Saturday. ?What changed:  ?how much to take ?how to take this ?when to take this ?additional instructions ?  ?aspirin 325 MG EC tablet ?Take 325 mg by mouth daily as needed for pain. ?  ?aspirin 81 MG EC tablet ?Take 1 tablet (81 mg total) by mouth daily. Swallow whole. ?  ?atorvastatin 40 MG tablet ?Commonly known as: LIPITOR ?Take 2  tablets (80 mg total) by mouth daily. ?What changed: how much to take ?  ?calcium acetate 667 MG capsule ?Commonly known as: PHOSLO ?Take 2 capsule by mouth three times a day with meals ?What changed:  ?how much to take ?when to take this ?  ?hydrOXYzine 10 MG tablet ?Commonly known as: ATARAX ?Take 1 tablet (10 mg total) by mouth 3 (three) times daily as needed. ?  ?metoprolol tartrate 25 MG tablet ?Commonly known as: LOPRESSOR ?TAKE 1 TABLET BY MOUTH TWICE A DAY ?  ?oxyCODONE 5 MG immediate release tablet ?Commonly known as: Roxicodone ?Take 1 tablet (5 mg total) by mouth every 8 (eight) hours as  needed for up to 10 doses for breakthrough pain. ?  ?oxyCODONE-acetaminophen 5-325 MG tablet ?Commonly known as: PERCOCET/ROXICET ?Take 1 tablet by mouth every 6 (six) hours as needed for moderate pain. ?  ? ?  ? ?Activity: activity as tolerated ?Diet: renal diet ?Wound Care: keep wound clean and dry ? ?Follow-up with [facility] in 3 weeks. ? ?Signed: ?Dagoberto Ligas ?09/08/2021 ?10:51 AM ? ?

## 2021-09-18 NOTE — H&P (Signed)
[facility] [place] [facility] ?PROGRESS NOTE ?  ?ASSESSMENT / PLAN: ?Albert Harris is a 66 y.o. male with atherosclerosis of native arteries of right lower extremity causing ulceration. patients with chronic limb threatening ischemia have an annual risk of cardiovascular mortality of 25% and a high risk of amputation.  ?  ?WIfI score calculated based on clinical exam and non-invasive measurements. Clinical stage III. Moderate amputation risk. High potential benefit from revascularization. ?  ?Recommend the following which can slow the progression of atherosclerosis and reduce the risk of major adverse cardiac / limb events:  ?Complete cessation from all tobacco products. ?Blood glucose control with goal A1c < 7%. ?Blood pressure control with goal blood pressure < 140/90 mmHg. ?Lipid reduction therapy with goal LDL-C <100 mg/dL (<70 if symptomatic from PAD).  ?Aspirin 81mg  PO QD.  ?Atorvastatin 40-80mg  PO QD (or other "high intensity" statin therapy). ?  ?Plan right lower extremity angiogram with possible intervention via left common femoral approach in cath lab today.  ?  ?SUBJECTIVE: ?No interval changes.  ?  ?OBJECTIVE: ?BP (!) 186/101   Pulse 67   Temp 97.9 ?F (36.6 ?C) (Oral)   Resp 16   Wt 74.5 kg   SpO2 99%   BMI 24.97 kg/m?  ?  ?No acute distress ?Regular rate and rhythm ?Unlabored breathing ?Unchanged appearance in feet ?Doppler flow in both feet ?  ?  ?  Latest Ref Rng & Units 08/29/2021  ? 12:17 AM 08/28/2021  ? 11:26 AM 08/07/2021  ?  5:18 PM  ?CBC  ?WBC 4.0 - 10.5 K/uL 11.8   11.1      ?Hemoglobin 13.0 - 17.0 g/dL 10.5   10.8   11.6    ?Hematocrit 39.0 - 52.0 % 34.0   34.7   34.0    ?Platelets 150 - 400 K/uL 145   175      ?  ?  ?  ?  Latest Ref Rng & Units 08/29/2021  ?  6:58 AM 08/29/2021  ? 12:17 AM 08/28/2021  ? 11:26 AM  ?CMP  ?Glucose 70 - 99 mg/dL 88   94   73    ?BUN 8 - 23 mg/dL 77   71   59    ?Creatinine 0.61 - 1.24 mg/dL 10.46   10.08   9.08    ?Sodium 135 - 145 mmol/L 137   137    138    ?Potassium 3.5 - 5.1 mmol/L 6.3   6.1   5.6    ?Chloride 98 - 111 mmol/L 97   95   97    ?CO2 22 - 32 mmol/L 22   22   23     ?Calcium 8.9 - 10.3 mg/dL 9.2   9.4   9.7    ?  ?  ?Estimated Creatinine Clearance: 6.6 mL/min (A) (by C-G formula based on SCr of 10.46 mg/dL (H)). ?  ? VASCULAR SURGERY: ?  ?This patient presents with a nonhealing wound on his right great toe.  He has severe infrainguinal arterial occlusive disease with disease in the deep femoral artery and occlusion of the superficial femoral artery and reconstitution of his above-knee popliteal artery which has some mild disease.  He has significant calcific disease in his common femoral artery with an iliac stent just above that.  This is a Viabahn stent. ?  ?I have looked at his vein map and he has reasonable vein on the right leg.  I would be reluctant to  take vein from the left leg as he has significant infrainguinal arterial occlusive disease on the left. ?  ?He may require endarterectomy in the common femoral artery.  I will explore the above-knee popliteal artery if this is not adequate then we will go below the knee.  Preferentially use vein.  I have discussed the indications for the procedure and the potential complications with the patient and he is agreeable to proceed. ?  ? ?[name], MD ?4:45 PM ? ? ?

## 2021-09-19 ENCOUNTER — Other Ambulatory Visit: Payer: Self-pay

## 2021-09-20 ENCOUNTER — Emergency Department (HOSPITAL_COMMUNITY): Payer: Medicare Other

## 2021-09-20 ENCOUNTER — Emergency Department (HOSPITAL_BASED_OUTPATIENT_CLINIC_OR_DEPARTMENT_OTHER)
Admit: 2021-09-20 | Discharge: 2021-09-20 | Disposition: A | Discharge status: Home / Self Care | Payer: Medicare Other | Attending: Emergency Medicine | Admitting: Emergency Medicine

## 2021-09-20 ENCOUNTER — Other Ambulatory Visit: Payer: Self-pay

## 2021-09-20 ENCOUNTER — Inpatient Hospital Stay (HOSPITAL_COMMUNITY)
Admission: EM | Admit: 2021-09-20 | Discharge: 2021-09-23 | DRG: 856 | Disposition: A | Discharge status: Home Health | Payer: Medicare Other | Attending: Infectious Diseases | Admitting: Infectious Diseases

## 2021-09-20 ENCOUNTER — Encounter (HOSPITAL_COMMUNITY): Payer: Self-pay | Admitting: Emergency Medicine

## 2021-09-20 DIAGNOSIS — T8149XA Infection following a procedure, other surgical site, initial encounter: Principal | ICD-10-CM | POA: Diagnosis present

## 2021-09-20 DIAGNOSIS — I5022 Chronic systolic (congestive) heart failure: Secondary | ICD-10-CM | POA: Diagnosis present

## 2021-09-20 DIAGNOSIS — I509 Heart failure, unspecified: Secondary | ICD-10-CM | POA: Diagnosis not present

## 2021-09-20 DIAGNOSIS — E785 Hyperlipidemia, unspecified: Secondary | ICD-10-CM | POA: Diagnosis present

## 2021-09-20 DIAGNOSIS — J449 Chronic obstructive pulmonary disease, unspecified: Secondary | ICD-10-CM | POA: Diagnosis present

## 2021-09-20 DIAGNOSIS — Z87891 Personal history of nicotine dependence: Secondary | ICD-10-CM | POA: Diagnosis not present

## 2021-09-20 DIAGNOSIS — Z992 Dependence on renal dialysis: Secondary | ICD-10-CM

## 2021-09-20 DIAGNOSIS — B9562 Methicillin resistant Staphylococcus aureus infection as the cause of diseases classified elsewhere: Secondary | ICD-10-CM | POA: Diagnosis present

## 2021-09-20 DIAGNOSIS — Z8249 Family history of ischemic heart disease and other diseases of the circulatory system: Secondary | ICD-10-CM

## 2021-09-20 DIAGNOSIS — I898 Other specified noninfective disorders of lymphatic vessels and lymph nodes: Secondary | ICD-10-CM | POA: Diagnosis present

## 2021-09-20 DIAGNOSIS — D631 Anemia in chronic kidney disease: Secondary | ICD-10-CM | POA: Diagnosis present

## 2021-09-20 DIAGNOSIS — Z8616 Personal history of COVID-19: Secondary | ICD-10-CM | POA: Diagnosis not present

## 2021-09-20 DIAGNOSIS — L02214 Cutaneous abscess of groin: Secondary | ICD-10-CM | POA: Diagnosis present

## 2021-09-20 DIAGNOSIS — Z888 Allergy status to other drugs, medicaments and biological substances status: Secondary | ICD-10-CM | POA: Diagnosis not present

## 2021-09-20 DIAGNOSIS — Z79899 Other long term (current) drug therapy: Secondary | ICD-10-CM

## 2021-09-20 DIAGNOSIS — I739 Peripheral vascular disease, unspecified: Secondary | ICD-10-CM | POA: Diagnosis present

## 2021-09-20 DIAGNOSIS — I253 Aneurysm of heart: Secondary | ICD-10-CM | POA: Diagnosis present

## 2021-09-20 DIAGNOSIS — Z8673 Personal history of transient ischemic attack (TIA), and cerebral infarction without residual deficits: Secondary | ICD-10-CM

## 2021-09-20 DIAGNOSIS — K219 Gastro-esophageal reflux disease without esophagitis: Secondary | ICD-10-CM | POA: Diagnosis present

## 2021-09-20 DIAGNOSIS — Y838 Other surgical procedures as the cause of abnormal reaction of the patient, or of later complication, without mention of misadventure at the time of the procedure: Secondary | ICD-10-CM | POA: Diagnosis present

## 2021-09-20 DIAGNOSIS — N2581 Secondary hyperparathyroidism of renal origin: Secondary | ICD-10-CM | POA: Diagnosis present

## 2021-09-20 DIAGNOSIS — I110 Hypertensive heart disease with heart failure: Secondary | ICD-10-CM | POA: Diagnosis not present

## 2021-09-20 DIAGNOSIS — I132 Hypertensive heart and chronic kidney disease with heart failure and with stage 5 chronic kidney disease, or end stage renal disease: Secondary | ICD-10-CM | POA: Diagnosis present

## 2021-09-20 DIAGNOSIS — Q2112 Patent foramen ovale: Secondary | ICD-10-CM | POA: Diagnosis not present

## 2021-09-20 DIAGNOSIS — M7989 Other specified soft tissue disorders: Secondary | ICD-10-CM | POA: Diagnosis not present

## 2021-09-20 DIAGNOSIS — N186 End stage renal disease: Secondary | ICD-10-CM | POA: Diagnosis present

## 2021-09-20 DIAGNOSIS — Z7982 Long term (current) use of aspirin: Secondary | ICD-10-CM | POA: Diagnosis not present

## 2021-09-20 HISTORY — DX: Dependence on renal dialysis: N18.6

## 2021-09-20 HISTORY — DX: Infection following a procedure, other surgical site, initial encounter: T81.49XA

## 2021-09-20 HISTORY — DX: End stage renal disease: Z99.2

## 2021-09-20 LAB — COMPREHENSIVE METABOLIC PANEL
ALT: 8 U/L (ref 0–44)
AST: 18 U/L (ref 15–41)
Albumin: 3.2 g/dL — ABNORMAL LOW (ref 3.5–5.0)
Alkaline Phosphatase: 116 U/L (ref 38–126)
Anion gap: 12 (ref 5–15)
BUN: 28 mg/dL — ABNORMAL HIGH (ref 8–23)
CO2: 31 mmol/L (ref 22–32)
Calcium: 10.4 mg/dL — ABNORMAL HIGH (ref 8.9–10.3)
Chloride: 95 mmol/L — ABNORMAL LOW (ref 98–111)
Creatinine, Ser: 7.33 mg/dL — ABNORMAL HIGH (ref 0.61–1.24)
GFR, Estimated: 8 mL/min — ABNORMAL LOW (ref >60)
Glucose, Bld: 110 mg/dL — ABNORMAL HIGH (ref 70–99)
Potassium: 3.8 mmol/L (ref 3.5–5.1)
Sodium: 138 mmol/L (ref 135–145)
Total Bilirubin: 0.6 mg/dL (ref 0.3–1.2)
Total Protein: 7.3 g/dL (ref 6.5–8.1)

## 2021-09-20 LAB — CBC WITH DIFFERENTIAL/PLATELET
Abs Immature Granulocytes: 0.06 10*3/uL (ref 0.00–0.07)
Basophils Absolute: 0.1 10*3/uL (ref 0.0–0.1)
Basophils Relative: 1 %
Eosinophils Absolute: 0.5 10*3/uL (ref 0.0–0.5)
Eosinophils Relative: 4 %
HCT: 33.4 % — ABNORMAL LOW (ref 39.0–52.0)
Hemoglobin: 10 g/dL — ABNORMAL LOW (ref 13.0–17.0)
Immature Granulocytes: 0 %
Lymphocytes Relative: 9 %
Lymphs Abs: 1.2 10*3/uL (ref 0.7–4.0)
MCH: 32.4 pg (ref 26.0–34.0)
MCHC: 29.9 g/dL — ABNORMAL LOW (ref 30.0–36.0)
MCV: 108.1 fL — ABNORMAL HIGH (ref 80.0–100.0)
Monocytes Absolute: 0.9 10*3/uL (ref 0.1–1.0)
Monocytes Relative: 7 %
Neutro Abs: 11 10*3/uL — ABNORMAL HIGH (ref 1.7–7.7)
Neutrophils Relative %: 79 %
Platelets: 193 10*3/uL (ref 150–400)
RBC: 3.09 MIL/uL — ABNORMAL LOW (ref 4.22–5.81)
RDW: 16.8 % — ABNORMAL HIGH (ref 11.5–15.5)
WBC: 13.8 10*3/uL — ABNORMAL HIGH (ref 4.0–10.5)
nRBC: 0 % (ref 0.0–0.2)

## 2021-09-20 LAB — PHOSPHORUS: Phosphorus: 5.2 mg/dL — ABNORMAL HIGH (ref 2.5–4.6)

## 2021-09-20 LAB — LIPASE, BLOOD: Lipase: 38 U/L (ref 11–51)

## 2021-09-20 MED ORDER — VANCOMYCIN HCL 750 MG/150ML IV SOLN: 750.0000 mg | INTRAVENOUS | Status: DC

## 2021-09-20 MED ORDER — ASPIRIN EC 81 MG PO TBEC
81.0000 mg | DELAYED_RELEASE_TABLET | Freq: Every day | ORAL | Status: DC
  Administered 2021-09-20 – 2021-09-23 (×4): 81 mg via ORAL
  Filled 2021-09-20 (×4): qty 1

## 2021-09-20 MED ORDER — ALBUTEROL SULFATE (2.5 MG/3ML) 0.083% IN NEBU: 3.0000 mL | INHALATION_SOLUTION | Freq: Four times a day (QID) | RESPIRATORY_TRACT | Status: DC | PRN

## 2021-09-20 MED ORDER — CALCIUM ACETATE (PHOS BINDER) 667 MG PO CAPS
1334.0000 mg | ORAL_CAPSULE | Freq: Three times a day (TID) | ORAL | Status: DC
  Administered 2021-09-21 – 2021-09-23 (×5): 1334 mg via ORAL
  Filled 2021-09-20 (×4): qty 2

## 2021-09-20 MED ORDER — HYDROXYZINE HCL 10 MG PO TABS
10.0000 mg | ORAL_TABLET | Freq: Three times a day (TID) | ORAL | Status: DC | PRN
  Filled 2021-09-20: qty 1

## 2021-09-20 MED ORDER — ATORVASTATIN CALCIUM 80 MG PO TABS
80.0000 mg | ORAL_TABLET | Freq: Every day | ORAL | Status: DC
  Administered 2021-09-20 – 2021-09-23 (×4): 80 mg via ORAL
  Filled 2021-09-20: qty 1
  Filled 2021-09-20: qty 2
  Filled 2021-09-20 (×2): qty 1

## 2021-09-20 MED ORDER — HEPARIN SODIUM (PORCINE) 5000 UNIT/ML IJ SOLN
5000.0000 [IU] | Freq: Three times a day (TID) | INTRAMUSCULAR | Status: DC
  Administered 2021-09-20 – 2021-09-23 (×8): 5000 [IU] via SUBCUTANEOUS
  Filled 2021-09-20 (×8): qty 1

## 2021-09-20 MED ORDER — AMLODIPINE BESYLATE 10 MG PO TABS
10.0000 mg | ORAL_TABLET | Freq: Every day | ORAL | Status: DC
  Administered 2021-09-21 – 2021-09-23 (×3): 10 mg via ORAL
  Filled 2021-09-20 (×3): qty 1

## 2021-09-20 MED ORDER — HYDROMORPHONE HCL 2 MG PO TABS
1.0000 mg | ORAL_TABLET | Freq: Four times a day (QID) | ORAL | Status: DC | PRN
Start: 2021-09-20 — End: 2021-09-23
  Administered 2021-09-20 – 2021-09-23 (×5): 1 mg via ORAL
  Filled 2021-09-20 (×5): qty 1

## 2021-09-20 MED ORDER — VANCOMYCIN HCL 1250 MG/250ML IV SOLN
1250.0000 mg | Freq: Once | INTRAVENOUS | Status: AC
  Administered 2021-09-20: 1250 mg via INTRAVENOUS
  Filled 2021-09-20: qty 250

## 2021-09-20 MED ORDER — METOPROLOL TARTRATE 25 MG PO TABS
25.0000 mg | ORAL_TABLET | Freq: Two times a day (BID) | ORAL | Status: DC
  Administered 2021-09-20 – 2021-09-23 (×6): 25 mg via ORAL
  Filled 2021-09-20 (×6): qty 1

## 2021-09-20 MED ORDER — ACETAMINOPHEN 500 MG PO TABS: 1000.0000 mg | ORAL_TABLET | Freq: Four times a day (QID) | ORAL | Status: DC | PRN

## 2021-09-20 MED ORDER — LIDOCAINE-EPINEPHRINE (PF) 2 %-1:200000 IJ SOLN
20.0000 mL | Freq: Once | INTRAMUSCULAR | Status: DC
Start: 2021-09-20 — End: 2021-09-20

## 2021-09-20 NOTE — ED Triage Notes (Signed)
Pt states he had a R femoral to popliteal artery bypass on 3/31.  Reports abscess to R groin x 3-4 days.  Reports chills and nausea. ?

## 2021-09-20 NOTE — ED Notes (Signed)
Pt care taken no complaints at this time. Waiting for room to be cleaned on floor ?

## 2021-09-20 NOTE — Progress Notes (Signed)
Right lower extremity venous duplex has been completed. ?Preliminary results can be found in CV Proc through chart review.  ?Results were given to Dr. Maryan Rued. ? ?09/20/21 5:03 PM ?Carlos Levering RVT   ?

## 2021-09-20 NOTE — ED Provider Triage Note (Signed)
Emergency Medicine Provider Triage Evaluation Note ? ?[name] , a 66 y.o. male  was evaluated in triage.  Pt complains of right-sided abdominal pain that radiates "into his sac."  Per chart review, patient had a right femoral popliteal artery bypass on 3/31. ? ? ?Patient is a Tuesday, Thursday, Saturday dialysis patient however is missing today because he was so weak he cannot get out of bed. ?Review of Systems  ?Positive: , Chills, abdominal pain and nausea ?Negative: Changes in bowel movements ? ?Physical Exam  ?BP (!) 177/86   Pulse 81   Temp 98.3 ?F (36.8 ?C)   Resp 18   SpO2 100%  ?Gen:   Awake, no distress   ?Resp:  Normal effort  ?MSK:   Moves extremities without difficulty  ?Other:  Distended abdomen, jaundiced skin.  Periumbilical tenderness ? ?Medical Decision Making  ?Medically screening exam initiated at 1:54 PM.  Appropriate orders placed.  BURNIS HALLING was informed that the remainder of the evaluation will be completed by another provider, this initial triage assessment does not replace that evaluation, and the importance of remaining in the ED until their evaluation is complete. ? ? ?  ?Rhae Hammock, PA-C ?09/20/21 1357 ? ?

## 2021-09-20 NOTE — Consult Note (Addendum)
Renal Service ?Consult Note ?[facility] ? ?[place] ?09/20/2021 ?Sol Blazing, MD ?Requesting Physician: Dr. Dareen Piano ? ?Reason for Consult: ESRD pt w/ wound infection ?HPI: The patient is a 66 y.o. year-old w/ hx of ESRD on HD, CHF, COPD, HL, HTN, PAD, sp CVA, tobacco use who presented to ED today w/ worsening pain and swelling in the R groin at the site of recent RLE surgical revascularization done on 09/05/21.  Pt reporting 3-4 days of feeling unwell but denies fever. Last HD was Thursday, missed today. Pain in R groin is the main c/o. We are asked to see for ESRD.   ? ?Pt denies any HD issues, RUA AV graft working well. Dtr drives him to HD. Lives w/ his wife. On HD for 2 years.   ? ? ?ROS - denies CP, no joint pain, no HA, no blurry vision, no rash, no diarrhea, no nausea/ vomiting ? ? ?Past Medical History  ?Past Medical History:  ?Diagnosis Date  ? Anemia   ? Asthma   ? Carotid stenosis 09/12/2019  ? CHF (congestive heart failure) ([place])   ? Chronic kidney disease   ? t, th. sat dialysis  ? Claudication in peripheral vascular disease ([place]) 09/12/2019  ? Complication of anesthesia   ? " i HAVE A HARD TIME WAKING UP " (only one time)  ? COPD (chronic obstructive pulmonary disease) ([place])   ? COVID-19   ? positive on  06/16/20  ? GERD (gastroesophageal reflux disease)   ? HLD (hyperlipidemia)   ? Hypertension   ? Peripheral vascular disease ([place])   ? PFO with atrial septal aneurysm   ? by echo 11/2017  ? Pneumonia   ? Stroke [facility])   ? [name] (transient ischemic attack) 11/2017  ? Tobacco abuse   ? quit 07/17/19  ? ?Past Surgical History  ?Past Surgical History:  ?Procedure Laterality Date  ? ABDOMINAL AORTOGRAM W/LOWER EXTREMITY N/A 06/21/2020  ? Procedure: ABDOMINAL AORTOGRAM W/LOWER EXTREMITY;  Surgeon: Angelia Mould, MD;  Location: [place] CV [facility];  Service: Cardiovascular;  Laterality: N/A;  ? ABDOMINAL AORTOGRAM W/LOWER EXTREMITY Right 08/29/2021  ? Procedure: ABDOMINAL AORTOGRAM  W/LOWER EXTREMITY;  Surgeon: Cherre Robins, MD;  Location: [place] [facility];  Service: Cardiovascular;  Laterality: Right;  ? AV FISTULA PLACEMENT Left 03/28/2020  ? Procedure: ARTERIOVENOUS (AV) FISTULA CREATION LEFT;  Surgeon: [name], MD;  Location: [place];  Service: Vascular;  Laterality: Left;  ? AV FISTULA PLACEMENT Right 09/23/2020  ? Procedure: RIGHT Arm ARTERIOVENOUS GRAFT CREATION.;  Surgeon: Angelia Mould, MD;  Location: [facility] OR;  Service: Vascular;  Laterality: Right;  ? COLONOSCOPY    ? COLONOSCOPY WITH PROPOFOL N/A 06/27/2020  ? Procedure: COLONOSCOPY WITH PROPOFOL;  Surgeon: Carol Ada, MD;  Location: [place];  Service: Endoscopy;  Laterality: N/A;  ? ENDARTERECTOMY FEMORAL Right 09/05/2021  ? Procedure: ENDARTERECTOMY FEMORAL;  Surgeon: Angelia Mould, MD;  Location: [facility] OR;  Service: Vascular;  Laterality: Right;  ? FEMORAL-POPLITEAL BYPASS GRAFT Right 09/05/2021  ? Procedure: RIGHT FEMORAL-POPLITEAL ARTERY BYPASS WITH Spaphenous Vein;  Surgeon: Angelia Mould, MD;  Location: [place];  Service: Vascular;  Laterality: Right;  ? FINGER SURGERY    ? HEMOSTASIS CLIP PLACEMENT  06/27/2020  ? Procedure: HEMOSTASIS CLIP PLACEMENT;  Surgeon: Carol Ada, MD;  Location: [place];  Service: Endoscopy;;  ? INSERTION OF DIALYSIS CATHETER Right 06/24/2020  ? Procedure: INSERTION OF TUNNELED  DIALYSIS CATHETER; Removal of temporary dialysis  catheter right neck;  Surgeon: Angelia Mould, MD;  Location: [facility] OR;  Service: Vascular;  Laterality: Right;  ? LIGATION OF ARTERIOVENOUS  FISTULA Left 07/24/2020  ? Procedure: LIGATION OF LEFT BRACHIOCEPHALIC ARTERIOVENOUS  FISTULA;  Surgeon: [name], MD;  Location: [place];  Service: Vascular;  Laterality: Left;  ? PERIPHERAL VASCULAR INTERVENTION Right 06/21/2020  ? Procedure: PERIPHERAL VASCULAR INTERVENTION;  Surgeon: Angelia Mould, MD;  Location: [place] CV LAB;  Service: Cardiovascular;   Laterality: Right;  ? TEE WITHOUT CARDIOVERSION N/A 05/13/2021  ? Procedure: TRANSESOPHAGEAL ECHOCARDIOGRAM (TEE);  Surgeon: Donato Heinz, MD;  Location: [place];  Service: Cardiovascular;  Laterality: N/A;  ? VEIN HARVEST Right 09/05/2021  ? Procedure: RIGHT Saphenous VEIN HARVEST;  Surgeon: Angelia Mould, MD;  Location: [place];  Service: Vascular;  Laterality: Right;  ? ?Family History  ?Family History  ?Problem Relation Age of Onset  ? Hypertension Mother   ? Colon cancer Father   ? Stomach cancer Brother   ? ?Social History  reports that he quit smoking about 2 years ago. His smoking use included cigarettes. He has a 50.00 pack-year smoking history. He has never used smokeless tobacco. He reports that he does not currently use alcohol after a past usage of about 1.0 - 3.0 standard drink per week. He reports that he does not use drugs. ?Allergies  ?Allergies  ?Allergen Reactions  ? Lidocaine-Prilocaine Rash  ?  Caused red marks up and down arm  ? ?Home medications ?Prior to Admission medications   ?Medication Sig Start Date End Date Taking? Authorizing Provider  ?albuterol (VENTOLIN HFA) 108 (90 Base) MCG/ACT inhaler INHALE 1-2 PUFFS INTO THE LUNGS EVERY 6 (SIX) HOURS AS NEEDED FOR WHEEZING OR SHORTNESS OF BREATH. ?Patient taking differently: Inhale 2 puffs into the lungs every 6 (six) hours as needed for wheezing or shortness of breath. 07/02/20 10/11/21 Yes Maness, Arnette Norris, MD  ?amLODipine (NORVASC) 10 MG tablet Take 1 tablet (10 mg total) by mouth daily. 08/30/21  Yes Lacinda Axon, MD  ?amoxicillin-clavulanate (AUGMENTIN) 500-125 MG tablet Take 1 tablet daily. Take after dialysis on Tuesday, Thursday and Saturday. ?Patient taking differently: Take 1 tablet by mouth daily. Take after dialysis on Tuesday, Thursday and Saturday. 08/30/21  Yes Lacinda Axon, MD  ?acetaminophen (TYLENOL) 500 MG tablet Take 1,000 mg by mouth daily as needed for mild pain. ?Patient not taking: Reported on  09/20/2021    [provider]  ?aspirin 325 MG EC tablet Take 325 mg by mouth daily as needed for pain. ?Patient not taking: Reported on 09/20/2021    [provider]  ?aspirin EC 81 MG EC tablet Take 1 tablet (81 mg total) by mouth daily. Swallow whole. ?Patient not taking: Reported on 09/03/2021 08/30/21   Lacinda Axon, MD  ?atorvastatin (LIPITOR) 40 MG tablet Take 2 tablets (80 mg total) by mouth daily. ?Patient not taking: Reported on 09/20/2021 09/07/21 03/06/22  Dagoberto Ligas, PA-C  ?calcium acetate (PHOSLO) 667 MG capsule Take 2 capsule by mouth three times a day with meals ?Patient not taking: Reported on 09/20/2021 06/17/21     ?hydrOXYzine (ATARAX) 10 MG tablet Take 1 tablet (10 mg total) by mouth 3 (three) times daily as needed. ?Patient not taking: Reported on 08/28/2021 06/18/21   Kerin Perna, NP  ?metoprolol tartrate (LOPRESSOR) 25 MG tablet TAKE 1 TABLET BY MOUTH TWICE A DAY ?Patient not taking: Reported on 09/20/2021 07/25/20 10/11/21  Corliss Parish, MD  ?oxyCODONE (ROXICODONE)  5 MG immediate release tablet Take 1 tablet (5 mg total) by mouth every 8 (eight) hours as needed for up to 10 doses for breakthrough pain. ?Patient not taking: Reported on 09/20/2021 09/03/21   Lennice Sites, DO  ?oxyCODONE-acetaminophen (PERCOCET/ROXICET) 5-325 MG tablet Take 1 tablet by mouth every 6 (six) hours as needed for moderate pain. ?Patient not taking: Reported on 09/20/2021 09/07/21   Dagoberto Ligas, PA-C  ? ? ? ?Vitals:  ? 09/20/21 1645 09/20/21 1700 09/20/21 1715 09/20/21 1745  ?BP: (!) 152/89 (!) 158/86 (!) 171/90 (!) 186/95  ?Pulse: 89 90 90 90  ?Resp: (!) 28 (!) 21 (!) 22 (!) 25  ?Temp:      ?SpO2: 100% 98% 100% 100%  ? ?Exam ?Gen alert, no distress ?No jvd  ?Chest clear bilat to bases ?RRR no RG ?Abd soft ntnd no mass or ascites +bs ?R groin wound w/ VAC in place ?Ext 1-2+ RLE edema, no other edema ?Neuro is alert, Ox 3 , nf ?   RUA AVG+ bruit ? ? ? Home meds include - norvasc 10,  asa, lipitor, phoslo 2 ac, lopressor 25 bid, prns/ supps/ vits ? ? ? ? OP HD: SW TTS ? 4h  71.5kg   350/800  2/2 bath  Hep none  RFA AVG   ? - last HD 4/13 ? - mircera 100 q2, last 4/13, due 4/27 ? - hect 5 ug ti

## 2021-09-20 NOTE — Consult Note (Signed)
? ?ASSESSMENT & PLAN  ? ?INFECTED LYMPHOCELE RIGHT GROIN: I aspirated some cloudy lymphatic fluid from the right groin so I suspect the collection seen on CT scan is an infected lymphocele.  He has eaten today.  I have scheduled him for exploration of the right groin tomorrow for evacuation of lymphocele.  Fortunately the bypass graft is a vein graft and there is no prosthetic material in the right groin.  He does have significant swelling in the right leg and Dr. Maryan Rued has ordered a venous duplex scan. ? ? ? ?REASON FOR CONSULT:   ? ?Swelling in the right groin.  The consult is requested by Dr. Maryan Rued.  ? ?HPI:  ? ?Albert Harris is a 66 y.o. male who was seen on 08/28/2021 after dropping a jar on his right great toe.  He had significant peripheral arterial disease and underwent arteriography and was subsequently set up for right lower extremity revascularization.  Of note he has undergone bilateral external iliac artery stenting in January 2022. ? ?He was taken to the operating room on 09/05/2021 and underwent a right iliofemoral endarterectomy and profundoplasty.  He had severe calcific disease of his entire external iliac artery common femoral and deep femoral artery.  The superficial femoral artery was occluded.  In addition he had a right femoral to below-knee popliteal artery bypass with vein.  The proximal aspect of the vein graft was used to patch the arteriotomy in the external iliac and common femoral artery. ? ?He was discharged on postoperative day #2 and at that time his groin looked fine.  He states that for 5 days later he began developing swelling in the right groin and this continued to increase and he presented to the emergency department today. ? ?He has had significant swelling in the right leg which began after he was discharged. ? ?Past Medical History:  ?Diagnosis Date  ? Anemia   ? Asthma   ? Carotid stenosis 09/12/2019  ? CHF (congestive heart failure) ([place])   ? Chronic kidney disease    ? t, th. sat dialysis  ? Claudication in peripheral vascular disease ([place]) 09/12/2019  ? Complication of anesthesia   ? " i HAVE A HARD TIME WAKING UP " (only one time)  ? COPD (chronic obstructive pulmonary disease) ([place])   ? COVID-19   ? positive on  06/16/20  ? GERD (gastroesophageal reflux disease)   ? HLD (hyperlipidemia)   ? Hypertension   ? Peripheral vascular disease ([place])   ? PFO with atrial septal aneurysm   ? by echo 11/2017  ? Pneumonia   ? Stroke [facility])   ? [name] (transient ischemic attack) 11/2017  ? Tobacco abuse   ? quit 07/17/19  ? ? ?Family History  ?Problem Relation Age of Onset  ? Hypertension Mother   ? Colon cancer Father   ? Stomach cancer Brother   ? ? ?SOCIAL HISTORY: ?Social History  ? ?Tobacco Use  ? Smoking status: Former  ?  Packs/day: 1.00  ?  Years: 50.00  ?  Pack years: 50.00  ?  Types: Cigarettes  ?  Quit date: 07/17/2019  ?  Years since quitting: 2.1  ? Smokeless tobacco: Never  ?Substance Use Topics  ? Alcohol use: Not Currently  ?  Alcohol/week: 1.0 - 3.0 standard drink  ?  Types: 1 - 3 Cans of beer per week  ? ? ?Allergies  ?Allergen Reactions  ? Lidocaine-Prilocaine Rash  ?  Caused red marks up and down  arm  ? ? ?Current Facility-Administered Medications  ?Medication Dose Route Frequency Provider Last Rate Last Admin  ? vancomycin (VANCOREADY) IVPB 1250 mg/250 mL  1,250 mg Intravenous Once Lavenia Atlas, RPH      ? [START ON 09/23/2021] vancomycin (VANCOREADY) IVPB 750 mg/150 mL  750 mg Intravenous Q T,Th,Sa-HD Mancheril, Darnell Level, RPH      ? ?Current Outpatient Medications  ?Medication Sig Dispense Refill  ? acetaminophen (TYLENOL) 500 MG tablet Take 1,000 mg by mouth daily as needed for mild pain.    ? albuterol (VENTOLIN HFA) 108 (90 Base) MCG/ACT inhaler INHALE 1-2 PUFFS INTO THE LUNGS EVERY 6 (SIX) HOURS AS NEEDED FOR WHEEZING OR SHORTNESS OF BREATH. (Patient taking differently: Inhale 2 puffs into the lungs every 6 (six) hours as needed for wheezing or shortness of  breath.) 8 g 0  ? amLODipine (NORVASC) 10 MG tablet Take 1 tablet (10 mg total) by mouth daily. 30 tablet 0  ? amoxicillin-clavulanate (AUGMENTIN) 500-125 MG tablet Take 1 tablet daily. Take after dialysis on Tuesday, Thursday and Saturday. (Patient taking differently: Take 1 tablet by mouth daily. Take after dialysis on Tuesday, Thursday and Saturday.) 6 tablet 0  ? aspirin 325 MG EC tablet Take 325 mg by mouth daily as needed for pain.    ? aspirin EC 81 MG EC tablet Take 1 tablet (81 mg total) by mouth daily. Swallow whole. (Patient not taking: Reported on 09/03/2021) 30 tablet 5  ? atorvastatin (LIPITOR) 40 MG tablet Take 2 tablets (80 mg total) by mouth daily. 60 tablet 5  ? calcium acetate (PHOSLO) 667 MG capsule Take 2 capsule by mouth three times a day with meals (Patient taking differently: Take 1,334 mg by mouth 3 (three) times daily with meals.) 540 capsule 3  ? hydrOXYzine (ATARAX) 10 MG tablet Take 1 tablet (10 mg total) by mouth 3 (three) times daily as needed. (Patient not taking: Reported on 08/28/2021) 30 tablet 0  ? metoprolol tartrate (LOPRESSOR) 25 MG tablet TAKE 1 TABLET BY MOUTH TWICE A DAY 60 tablet 6  ? oxyCODONE (ROXICODONE) 5 MG immediate release tablet Take 1 tablet (5 mg total) by mouth every 8 (eight) hours as needed for up to 10 doses for breakthrough pain. 10 tablet 0  ? oxyCODONE-acetaminophen (PERCOCET/ROXICET) 5-325 MG tablet Take 1 tablet by mouth every 6 (six) hours as needed for moderate pain. 20 tablet 0  ? ? ?REVIEW OF SYSTEMS:  ?[X]  denotes positive finding, [ ]  denotes negative finding ?Cardiac  Comments:  ?Chest pain or chest pressure:    ?Shortness of breath upon exertion:    ?Short of breath when lying flat:    ?Irregular heart rhythm:    ?    ?Vascular    ?Pain in calf, thigh, or hip brought on by ambulation:    ?Pain in feet at night that wakes you up from your sleep:     ?Blood clot in your veins:    ?Leg swelling:  x Right leg  ?    ?Pulmonary    ?Oxygen at home:     ?Productive cough:     ?Wheezing:     ?    ?Neurologic    ?Sudden weakness in arms or legs:     ?Sudden numbness in arms or legs:     ?Sudden onset of difficulty speaking or slurred speech:    ?Temporary loss of vision in one eye:     ?Problems with dizziness:     ?    ?  Gastrointestinal    ?Blood in stool:     ?Vomited blood:     ?    ?Genitourinary    ?Burning when urinating:     ?Blood in urine:    ?    ?Psychiatric    ?Major depression:     ?    ?Hematologic    ?Bleeding problems:    ?Problems with blood clotting too easily:    ?    ?Skin    ?Rashes or ulcers:    ?    ?Constitutional    ?Fever or chills:    ?- ? ?PHYSICAL EXAM:  ? ?Vitals:  ? 09/20/21 1322 09/20/21 1536  ?BP: (!) 177/86 (!) 176/114  ?Pulse: 81 89  ?Resp: 18 (!) 23  ?Temp: 98.3 ?F (36.8 ?C)   ?SpO2: 100% 98%  ? ?There is no height or weight on file to calculate BMI. ?GENERAL: The patient is a well-nourished male, in no acute distress. The vital signs are documented above. ?CARDIAC: There is a regular rate and rhythm.  ?VASCULAR: He has significant swelling in the right groin so it is difficult to assess a pulse. ?I did aspirate after a Betadine prep the right groin and obtained some lymphatic fluid which was slightly cloudy so I suspect he has an infected lymphocele. ?He has a brisk posterior tibial and peroneal signal with the Doppler on the right.  This is the runoff he had postoperatively. ?He has brisk Doppler signals in the left foot. ? ?PULMONARY: There is good air exchange bilaterally without wheezing or rales. ?ABDOMEN: Soft and non-tender with normal pitched bowel sounds.  ?MUSCULOSKELETAL: There are no major deformities. ?NEUROLOGIC: No focal weakness or paresthesias are detected. ?SKIN: There are no ulcers or rashes noted. ?PSYCHIATRIC: The patient has a normal affect. ? ?DATA:   ? ?CT ABDOMEN PELVIS: I reviewed his CT abdomen pelvis.  He has a fluid collection in the right groin overlying the bypass.  The CT scan was done without  contrast.  Of note he has end-stage renal disease and contrast can be used if needed.  However this did not appear to show a pseudoaneurysm but rather a collection overlying the graft. ? ?LABS: His labs today show po

## 2021-09-20 NOTE — ED Provider Notes (Signed)
?[facility] ?Provider Note ? ? ?CSN: [id number] ?Arrival date & time: 09/20/21  1316 ? ?  ? ?History ? ?Chief Complaint  ?Patient presents with  ? abscess S/p surgery  ? ? ?Albert Harris is a 66 y.o. male. ? ?Pt is a 66y/o male with hx of PAD s/p Right common femoral artery endarterectomy and femoral to below the knee popliteal bypass 09/05/21, ESRD on dialysis Tu/thr/sat, hypertension, COPD, tobacco abuse, GERD, CHF who is presenting today due to worsening pain swelling and redness in his right groin around his surgical site.  He reports this started 3 to 4 days ago and is gradually been worsening.  He feels generally unwell and weak but denies known fever.  He also says that he did not go to dialysis today but did have a full course on Thursday because it is so uncomfortable for him to sit he did not think he could sit for 4 hours.  He denies any cough or shortness of breath.  He has soreness in his abdomen but reports most of the pain is coming from his groin.  The other incisions down his leg look fine to him.  He has had persistent swelling in his legs since the surgery and he does not feel like that is gotten any worse. ? ?The history is provided by the patient and medical records.  ? ?  ? ?Home Medications ?Prior to Admission medications   ?Medication Sig Start Date End Date Taking? Authorizing Provider  ?acetaminophen (TYLENOL) 500 MG tablet Take 1,000 mg by mouth daily as needed for mild pain.    [provider]  ?albuterol (VENTOLIN HFA) 108 (90 Base) MCG/ACT inhaler INHALE 1-2 PUFFS INTO THE LUNGS EVERY 6 (SIX) HOURS AS NEEDED FOR WHEEZING OR SHORTNESS OF BREATH. ?Patient taking differently: Inhale 2 puffs into the lungs every 6 (six) hours as needed for wheezing or shortness of breath. 07/02/20 10/11/21  Maness, Arnette Norris, MD  ?amLODipine (NORVASC) 10 MG tablet Take 1 tablet (10 mg total) by mouth daily. 08/30/21   Lacinda Axon, MD  ?amoxicillin-clavulanate  (AUGMENTIN) 500-125 MG tablet Take 1 tablet daily. Take after dialysis on Tuesday, Thursday and Saturday. ?Patient taking differently: Take 1 tablet by mouth daily. Take after dialysis on Tuesday, Thursday and Saturday. 08/30/21   Lacinda Axon, MD  ?aspirin 325 MG EC tablet Take 325 mg by mouth daily as needed for pain.    [provider]  ?aspirin EC 81 MG EC tablet Take 1 tablet (81 mg total) by mouth daily. Swallow whole. ?Patient not taking: Reported on 09/03/2021 08/30/21   Lacinda Axon, MD  ?atorvastatin (LIPITOR) 40 MG tablet Take 2 tablets (80 mg total) by mouth daily. 09/07/21 03/06/22  Dagoberto Ligas, PA-C  ?calcium acetate (PHOSLO) 667 MG capsule Take 2 capsule by mouth three times a day with meals ?Patient taking differently: Take 1,334 mg by mouth 3 (three) times daily with meals. 06/17/21     ?hydrOXYzine (ATARAX) 10 MG tablet Take 1 tablet (10 mg total) by mouth 3 (three) times daily as needed. ?Patient not taking: Reported on 08/28/2021 06/18/21   Kerin Perna, NP  ?metoprolol tartrate (LOPRESSOR) 25 MG tablet TAKE 1 TABLET BY MOUTH TWICE A DAY 07/25/20 10/11/21  Corliss Parish, MD  ?oxyCODONE (ROXICODONE) 5 MG immediate release tablet Take 1 tablet (5 mg total) by mouth every 8 (eight) hours as needed for up to 10 doses for breakthrough pain. 09/03/21   Curatolo,  Adam, DO  ?oxyCODONE-acetaminophen (PERCOCET/ROXICET) 5-325 MG tablet Take 1 tablet by mouth every 6 (six) hours as needed for moderate pain. 09/07/21   Dagoberto Ligas, PA-C  ?   ? ?Allergies    ?Lidocaine-prilocaine   ? ?Review of Systems   ?Review of Systems ? ?Physical Exam ?Updated Vital Signs ?BP (!) 176/114   Pulse 89   Temp 98.3 ?F (36.8 ?C)   Resp (!) 23   SpO2 98%  ?Physical Exam ?Vitals and nursing note reviewed.  ?Constitutional:   ?   General: He is not in acute distress. ?   Appearance: He is well-developed.  ?HENT:  ?   Head: Normocephalic and atraumatic.  ?   Mouth/Throat:  ?   Mouth: Mucous  membranes are moist.  ?Eyes:  ?   Conjunctiva/sclera: Conjunctivae normal.  ?   Pupils: Pupils are equal, round, and reactive to light.  ?Cardiovascular:  ?   Rate and Rhythm: Normal rate and regular rhythm.  ?   Heart sounds: No murmur heard. ?Pulmonary:  ?   Effort: Pulmonary effort is normal. No respiratory distress.  ?   Breath sounds: Normal breath sounds. No wheezing or rales.  ?Abdominal:  ?   General: There is no distension.  ?   Palpations: Abdomen is soft.  ?   Tenderness: There is no abdominal tenderness. There is no guarding or rebound.  ? ? ?Musculoskeletal:     ?   General: No tenderness. Normal range of motion.  ?   Cervical back: Normal range of motion and neck supple.  ?   Right lower leg: Edema present.  ?   Comments: 3+ edema in the right lower extremity.  2 surgical incisions on the medial portion of the lower leg which appear to be healing well  ?Skin: ?   General: Skin is warm and dry.  ?   Capillary Refill: Capillary refill takes less than 2 seconds.  ?   Findings: No erythema or rash.  ?Neurological:  ?   Mental Status: He is alert and oriented to person, place, and time. Mental status is at baseline.  ?Psychiatric:     ?   Mood and Affect: Mood normal.     ?   Behavior: Behavior normal.  ? ? ?ED Results / Procedures / Treatments   ?Labs ?(all labs ordered are listed, but only abnormal results are displayed) ?Labs Reviewed  ?COMPREHENSIVE METABOLIC PANEL - Abnormal; Notable for the following components:  ?    Result Value  ? Chloride 95 (*)   ? Glucose, Bld 110 (*)   ? BUN 28 (*)   ? Creatinine, Ser 7.33 (*)   ? Calcium 10.4 (*)   ? Albumin 3.2 (*)   ? GFR, Estimated 8 (*)   ? All other components within normal limits  ?CBC WITH DIFFERENTIAL/PLATELET - Abnormal; Notable for the following components:  ? WBC 13.8 (*)   ? RBC 3.09 (*)   ? Hemoglobin 10.0 (*)   ? HCT 33.4 (*)   ? MCV 108.1 (*)   ? MCHC 29.9 (*)   ? RDW 16.8 (*)   ? Neutro Abs 11.0 (*)   ? All other components within normal limits   ?LIPASE, BLOOD  ?URINALYSIS, ROUTINE W REFLEX MICROSCOPIC  ? ? ?EKG ?None ? ?Radiology ?CT ABDOMEN PELVIS WO CONTRAST ? ?Result Date: 09/20/2021 ?CLINICAL DATA:  Right groin abscess. Recent right femoral to popliteal artery bypass surgery 2 weeks ago. EXAM: CT ABDOMEN AND PELVIS  WITHOUT CONTRAST TECHNIQUE: Multidetector CT imaging of the abdomen and pelvis was performed following the standard protocol without IV contrast. RADIATION DOSE REDUCTION: This exam was performed according to the departmental dose-optimization program which includes automated exposure control, adjustment of the mA and/or kV according to patient size and/or use of iterative reconstruction technique. COMPARISON:  CT pelvis dated September 03, 2021. FINDINGS: Lower chest: No acute abnormality.  Unchanged cardiomegaly. Hepatobiliary: No focal liver abnormality is seen. No gallstones, gallbladder wall thickening, or biliary dilatation. Pancreas: Unremarkable. No pancreatic ductal dilatation or surrounding inflammatory changes. Unchanged splenule in the pancreatic tail. Spleen: Normal in size without focal abnormality. Adrenals/Urinary Tract: Adrenal glands are unremarkable. Unchanged bilateral renal atrophy and renovascular calcification. No renal calculi or hydronephrosis. The bladder is decompressed. Stomach/Bowel: Stomach is within normal limits. Appendix appears normal. No evidence of bowel wall thickening, distention, or inflammatory changes. Left-sided colonic diverticulosis again noted. Vascular/Lymphatic: Aortic atherosclerosis. Left peri-aortic soft tissue thickening at the level of left renal artery origin with surrounding fat stranding persists but has decreased compared to prior study. Bilateral common iliac and right external iliac artery stents. Postsurgical changes in the right groin with new slightly ill-defined 5.0 x 3.7 cm fluid collection (series 3, image 75). Multiple new mildly enlarged right external iliac and inguinal lymph  nodes, likely reactive. Reproductive: Prostate is unremarkable. Other: No free fluid or pneumoperitoneum. Musculoskeletal: New soft tissue swelling in the anterior right upper thigh. No acute or significant osse

## 2021-09-20 NOTE — Progress Notes (Signed)
Pharmacy Antibiotic Note ? ?[name] is a 66 y.o. male admitted on 09/20/2021 with  swelling and redness around surgical site in R groin .  Pharmacy has been consulted for vancomycin dosing.  ? ?WBC elevated. Pt has a h/o ESRD on T/Th/Sat HD. He missed today's HD session since he was feeling unwell.  ? ?Plan: ?-Vancomycin 1250 mg IV load followed by vancomycin 750 mg IV with each HD session ?-F/u plans for HD session today. Will order additional maintenance dose today if HD scheduled ?-Monitor CBC, renal fx, cultures and clinical progress ?-Vanc levels as indicated  ? ?  ? ?Temp (24hrs), Avg:98.3 ?F (36.8 ?C), Min:98.3 ?F (36.8 ?C), Max:98.3 ?F (36.8 ?C) ? ?Recent Labs  ?Lab 09/20/21 ?1351  ?WBC 13.8*  ?CREATININE 7.33*  ?  ?CrCl cannot be calculated (Unknown ideal weight.).   ? ?Allergies  ?Allergen Reactions  ? Lidocaine-Prilocaine Rash  ?  Caused red marks up and down arm  ? ? ?Antimicrobials this admission: ?Vancomycin 4/15 >>  ? ?Dose adjustments this admission: ? ? ?Microbiology results: ? ? ?Thank you for allowing pharmacy to be a part of this patient?s care. ? ?Albertina Parr, PharmD., BCCCP ?Clinical Pharmacist ?Please refer to [facility] for unit-specific pharmacist  ? ? ?

## 2021-09-20 NOTE — H&P (Signed)
? ? ? ?Date: 09/20/2021     ?     ?     ?Patient Name:  Albert Harris MRN: [id number]  ?DOB: 1956-01-23 Age / Sex: 66 y.o., male   ?PCP: Kerin Perna, NP    ?     ?Medical Service: Internal Medicine Teaching Service    ?     ?Attending Physician: Dr. Aldine Contes, MD    ?First Contact: Dr. Rosezetta Schlatter, MD Pager: [phone]  ?Second Contact: Dr. Iona Beard, MD Pager: [phone]  ?     ?After Hours (After 5p/  First Contact Pager: [phone]  ?weekends / holidays): Second Contact Pager: [phone]  ? ?Chief Complaint: Groin pain/infection ? ?History of Present Illness:  ?Albert Harris is a 66 year old male with past medical history of ESRD on HD, HTN, HLD, HFrEF, severe PAD/significant calcific disease of the common femoral artery s/p Viabahn stent, and recent admission in which endarterectomy of the common femoral artery was completed who presented for progressively worsening pain and swelling in the groin since discharge.  Patient reports that 2 days after discharge, he noticed a "painful knot" in his groin that began growing, coming in after the pain became unbearable.  He reports that the pain does not radiate, but he does have some mild retropopliteal pain as well.  Produces a scant amount of urine at baseline, and this has not been affected by this abscess. ? ?He still endorses some dyspnea. Denies fever, sweats, but some chills. Some nausea last night, but no emetic episodes.  He reports an intact appetite. Denies HA and chest pain.  ? ?Missed HD today, reports his BP is always elevated regardless of missing HD.  ? ? ? ?Meds:  ?Current Meds  ?Medication Sig  ? albuterol (VENTOLIN HFA) 108 (90 Base) MCG/ACT inhaler INHALE 1-2 PUFFS INTO THE LUNGS EVERY 6 (SIX) HOURS AS NEEDED FOR WHEEZING OR SHORTNESS OF BREATH. (Patient taking differently: Inhale 2 puffs into the lungs every 6 (six) hours as needed for wheezing or shortness of breath.)  ? amLODipine (NORVASC) 10 MG tablet Take 1 tablet (10 mg total) by  mouth daily.  ? amoxicillin-clavulanate (AUGMENTIN) 500-125 MG tablet Take 1 tablet daily. Take after dialysis on Tuesday, Thursday and Saturday. (Patient taking differently: Take 1 tablet by mouth daily. Take after dialysis on Tuesday, Thursday and Saturday.)  ?Compliance: ?amlodipine  ?Phoslo ? ?Not compliant with: ?Metoprolol ? ?Patient cannot recall level of compliance with other medications ? ? ?Allergies: ?Allergies as of 09/20/2021 - Review Complete 09/20/2021  ?Allergen Reaction Noted  ? Lidocaine-prilocaine Rash 08/28/2021  ? ?Past Medical History:  ?Diagnosis Date  ? Anemia   ? Asthma   ? Carotid stenosis 09/12/2019  ? CHF (congestive heart failure) ([place])   ? Chronic kidney disease   ? t, th. sat dialysis  ? Claudication in peripheral vascular disease ([place]) 09/12/2019  ? Complication of anesthesia   ? " i HAVE A HARD TIME WAKING UP " (only one time)  ? COPD (chronic obstructive pulmonary disease) ([place])   ? COVID-19   ? positive on  06/16/20  ? GERD (gastroesophageal reflux disease)   ? HLD (hyperlipidemia)   ? Hypertension   ? Peripheral vascular disease ([place])   ? PFO with atrial septal aneurysm   ? by echo 11/2017  ? Pneumonia   ? Stroke [facility])   ? [name] (transient ischemic attack) 11/2017  ? Tobacco abuse   ? quit 07/17/19  ? ? ?Family History: No family  history of diabetes, MI, CAD ?     ?Family History  ?Problem Relation Age of Onset  ? Hypertension Mother    ? Colon cancer Father    ? Stomach cancer Brother   ? ? ?Social History: Lives at home with wife. Independent of all ADLs/iADLs. Used to work in [place]. Smoked 2 packs a day for 50 years. Quit on 07/17/19. No longer drinks alcohol, but used to drink 6 drinks a week. No other substance use history. Diet consists of meat and vegetables. ? ?Review of Systems: ?A complete ROS was negative except as per HPI.  ? ?Physical Exam: ?Blood pressure (!) 186/95, pulse 90, temperature 98.3 ?F (36.8 ?C), resp. rate (!) 25, SpO2 100 %. ?Physical  Exam: ?General: Chronically ill-appearing male in acute pain ?HENT: normocephalic, atraumatic, external nares and ears appear normal ?EYES: conjunctiva non-erythematous, no scleral icterus ?CV: regular rate, abnormal rhythm, no murmurs, rubs, gallops.  RLE with 3+ edema from knee to foot.  Distal pulses intact on left lower extremity; nonpalpable on right lower extremity. ?Pulmonary: normal work of breathing on RA, lungs clear to auscultation bilaterally ?Abdominal: non-distended, soft, non-tender to palpation, normal BS all 4 quadrants ?Skin: Warm and dry.  Right groin with erythema and warmth in the lateral suprapubic area, extending to intertriginous fold, abscess with some purulence present and closed incision from earlier.  Incisions from previous procedures c/d/i.   ?Neurological: Awake, alert and oriented x3. No focal deficits of CNII-XII. ?Psych: normal affect and behavior  ? ?Assessment & Plan by Problem: ?Albert Harris is a 66 year old male with past medical history of ESRD on HD, severe PAD/significant calcific disease of the common femoral artery s/p Viabahn stent, and recent admission in which endarterectomy of the common femoral artery was completed who presented for progressively worsening pain and swelling in the groin since discharge. ? ?Principal Problem: ?  Surgical wound infection ? ?#Surgical wound infection/Abscess of R groin ?Patient s/p endarterectomy of the right common femoral artery.  Now with abscess, which vascular surgery was able to express purulent drainage.  WBC 13.8, ANC 11.0, but patient afebrile. ?-Vascular surgery to perform evacuation of lymphocele tomorrow ?- Patient n.p.o. ?- CTM WBC on daily CBC ?- Continue to closely monitor vitals ? ?#ESRD on HD TThS ?Patient has history of ESRD on HD TThSa.  Patient missed HD today due to pain, last session 4/13. He has AV fistula in his right arm.  ?--Nephrology consulted about the possibility of HD prior to procedure ?--Avoid nephrotoxic  medications ?--Dose medications renally ?  ?#HTN ?On admission, BP was 177/86 increased to 186/95. Patient's home medications are amlodipine 10mg  daily and metoprolol 25mg  BID. Will restart patient's home medications. ?--Amlodipine 10mg  daily ?--Metoprolol 25mg  BID ?  ?#HFrEF ?LVEF estimated at 35-40% on TEE done 05/13/21. Patient does not report taking any home medications. ?--Strict I&O, daily weights ?--Keep Mg >2.0, K+ 4.0 ?  ?#COPD ?Patient has history of COPD and is a former smoker, with 100 pack-year history. He quit smoking in 2021. Patient has albuterol inhaler at home as needed, but reports that he does not use it. ?--PRN albuterol nebulizer  ? ?Best Practices ?Diet: N.p.o. except for sips with meds ?DVT prophylaxis: SQ heparin ?Code:Full ?Dispo: Admit patient to Observation with expected length of stay less than 2 midnights. ? ?Signed: ?Rosezetta Schlatter, MD ?09/20/2021, 6:14 PM  ?Pager: [phone] ?After 5pm on weekdays and 1pm on weekends: On Call pager: [phone]  ?

## 2021-09-20 NOTE — H&P (View-Only) (Signed)
? ?ASSESSMENT & PLAN  ? ?INFECTED LYMPHOCELE RIGHT GROIN: I aspirated some cloudy lymphatic fluid from the right groin so I suspect the collection seen on CT scan is an infected lymphocele.  He has eaten today.  I have scheduled him for exploration of the right groin tomorrow for evacuation of lymphocele.  Fortunately the bypass graft is a vein graft and there is no prosthetic material in the right groin.  He does have significant swelling in the right leg and Dr. Maryan Rued has ordered a venous duplex scan. ? ? ? ?REASON FOR CONSULT:   ? ?Swelling in the right groin.  The consult is requested by Dr. Maryan Rued.  ? ?HPI:  ? ?Albert Harris is a 66 y.o. male who was seen on 08/28/2021 after dropping a jar on his right great toe.  He had significant peripheral arterial disease and underwent arteriography and was subsequently set up for right lower extremity revascularization.  Of note he has undergone bilateral external iliac artery stenting in January 2022. ? ?He was taken to the operating room on 09/05/2021 and underwent a right iliofemoral endarterectomy and profundoplasty.  He had severe calcific disease of his entire external iliac artery common femoral and deep femoral artery.  The superficial femoral artery was occluded.  In addition he had a right femoral to below-knee popliteal artery bypass with vein.  The proximal aspect of the vein graft was used to patch the arteriotomy in the external iliac and common femoral artery. ? ?He was discharged on postoperative day #2 and at that time his groin looked fine.  He states that for 5 days later he began developing swelling in the right groin and this continued to increase and he presented to the emergency department today. ? ?He has had significant swelling in the right leg which began after he was discharged. ? ?Past Medical History:  ?Diagnosis Date  ? Anemia   ? Asthma   ? Carotid stenosis 09/12/2019  ? CHF (congestive heart failure) ([place])   ? Chronic kidney disease    ? t, th. sat dialysis  ? Claudication in peripheral vascular disease ([place]) 09/12/2019  ? Complication of anesthesia   ? " i HAVE A HARD TIME WAKING UP " (only one time)  ? COPD (chronic obstructive pulmonary disease) ([place])   ? COVID-19   ? positive on  06/16/20  ? GERD (gastroesophageal reflux disease)   ? HLD (hyperlipidemia)   ? Hypertension   ? Peripheral vascular disease ([place])   ? PFO with atrial septal aneurysm   ? by echo 11/2017  ? Pneumonia   ? Stroke [facility])   ? [name] (transient ischemic attack) 11/2017  ? Tobacco abuse   ? quit 07/17/19  ? ? ?Family History  ?Problem Relation Age of Onset  ? Hypertension Mother   ? Colon cancer Father   ? Stomach cancer Brother   ? ? ?SOCIAL HISTORY: ?Social History  ? ?Tobacco Use  ? Smoking status: Former  ?  Packs/day: 1.00  ?  Years: 50.00  ?  Pack years: 50.00  ?  Types: Cigarettes  ?  Quit date: 07/17/2019  ?  Years since quitting: 2.1  ? Smokeless tobacco: Never  ?Substance Use Topics  ? Alcohol use: Not Currently  ?  Alcohol/week: 1.0 - 3.0 standard drink  ?  Types: 1 - 3 Cans of beer per week  ? ? ?Allergies  ?Allergen Reactions  ? Lidocaine-Prilocaine Rash  ?  Caused red marks up and down  arm  ? ? ?Current Facility-Administered Medications  ?Medication Dose Route Frequency Provider Last Rate Last Admin  ? vancomycin (VANCOREADY) IVPB 1250 mg/250 mL  1,250 mg Intravenous Once Lavenia Atlas, RPH      ? [START ON 09/23/2021] vancomycin (VANCOREADY) IVPB 750 mg/150 mL  750 mg Intravenous Q T,Th,Sa-HD Mancheril, Darnell Level, RPH      ? ?Current Outpatient Medications  ?Medication Sig Dispense Refill  ? acetaminophen (TYLENOL) 500 MG tablet Take 1,000 mg by mouth daily as needed for mild pain.    ? albuterol (VENTOLIN HFA) 108 (90 Base) MCG/ACT inhaler INHALE 1-2 PUFFS INTO THE LUNGS EVERY 6 (SIX) HOURS AS NEEDED FOR WHEEZING OR SHORTNESS OF BREATH. (Patient taking differently: Inhale 2 puffs into the lungs every 6 (six) hours as needed for wheezing or shortness of  breath.) 8 g 0  ? amLODipine (NORVASC) 10 MG tablet Take 1 tablet (10 mg total) by mouth daily. 30 tablet 0  ? amoxicillin-clavulanate (AUGMENTIN) 500-125 MG tablet Take 1 tablet daily. Take after dialysis on Tuesday, Thursday and Saturday. (Patient taking differently: Take 1 tablet by mouth daily. Take after dialysis on Tuesday, Thursday and Saturday.) 6 tablet 0  ? aspirin 325 MG EC tablet Take 325 mg by mouth daily as needed for pain.    ? aspirin EC 81 MG EC tablet Take 1 tablet (81 mg total) by mouth daily. Swallow whole. (Patient not taking: Reported on 09/03/2021) 30 tablet 5  ? atorvastatin (LIPITOR) 40 MG tablet Take 2 tablets (80 mg total) by mouth daily. 60 tablet 5  ? calcium acetate (PHOSLO) 667 MG capsule Take 2 capsule by mouth three times a day with meals (Patient taking differently: Take 1,334 mg by mouth 3 (three) times daily with meals.) 540 capsule 3  ? hydrOXYzine (ATARAX) 10 MG tablet Take 1 tablet (10 mg total) by mouth 3 (three) times daily as needed. (Patient not taking: Reported on 08/28/2021) 30 tablet 0  ? metoprolol tartrate (LOPRESSOR) 25 MG tablet TAKE 1 TABLET BY MOUTH TWICE A DAY 60 tablet 6  ? oxyCODONE (ROXICODONE) 5 MG immediate release tablet Take 1 tablet (5 mg total) by mouth every 8 (eight) hours as needed for up to 10 doses for breakthrough pain. 10 tablet 0  ? oxyCODONE-acetaminophen (PERCOCET/ROXICET) 5-325 MG tablet Take 1 tablet by mouth every 6 (six) hours as needed for moderate pain. 20 tablet 0  ? ? ?REVIEW OF SYSTEMS:  ?[X]  denotes positive finding, [ ]  denotes negative finding ?Cardiac  Comments:  ?Chest pain or chest pressure:    ?Shortness of breath upon exertion:    ?Short of breath when lying flat:    ?Irregular heart rhythm:    ?    ?Vascular    ?Pain in calf, thigh, or hip brought on by ambulation:    ?Pain in feet at night that wakes you up from your sleep:     ?Blood clot in your veins:    ?Leg swelling:  x Right leg  ?    ?Pulmonary    ?Oxygen at home:     ?Productive cough:     ?Wheezing:     ?    ?Neurologic    ?Sudden weakness in arms or legs:     ?Sudden numbness in arms or legs:     ?Sudden onset of difficulty speaking or slurred speech:    ?Temporary loss of vision in one eye:     ?Problems with dizziness:     ?    ?  Gastrointestinal    ?Blood in stool:     ?Vomited blood:     ?    ?Genitourinary    ?Burning when urinating:     ?Blood in urine:    ?    ?Psychiatric    ?Major depression:     ?    ?Hematologic    ?Bleeding problems:    ?Problems with blood clotting too easily:    ?    ?Skin    ?Rashes or ulcers:    ?    ?Constitutional    ?Fever or chills:    ?- ? ?PHYSICAL EXAM:  ? ?Vitals:  ? 09/20/21 1322 09/20/21 1536  ?BP: (!) 177/86 (!) 176/114  ?Pulse: 81 89  ?Resp: 18 (!) 23  ?Temp: 98.3 ?F (36.8 ?C)   ?SpO2: 100% 98%  ? ?There is no height or weight on file to calculate BMI. ?GENERAL: The patient is a well-nourished male, in no acute distress. The vital signs are documented above. ?CARDIAC: There is a regular rate and rhythm.  ?VASCULAR: He has significant swelling in the right groin so it is difficult to assess a pulse. ?I did aspirate after a Betadine prep the right groin and obtained some lymphatic fluid which was slightly cloudy so I suspect he has an infected lymphocele. ?He has a brisk posterior tibial and peroneal signal with the Doppler on the right.  This is the runoff he had postoperatively. ?He has brisk Doppler signals in the left foot. ? ?PULMONARY: There is good air exchange bilaterally without wheezing or rales. ?ABDOMEN: Soft and non-tender with normal pitched bowel sounds.  ?MUSCULOSKELETAL: There are no major deformities. ?NEUROLOGIC: No focal weakness or paresthesias are detected. ?SKIN: There are no ulcers or rashes noted. ?PSYCHIATRIC: The patient has a normal affect. ? ?DATA:   ? ?CT ABDOMEN PELVIS: I reviewed his CT abdomen pelvis.  He has a fluid collection in the right groin overlying the bypass.  The CT scan was done without  contrast.  Of note he has end-stage renal disease and contrast can be used if needed.  However this did not appear to show a pseudoaneurysm but rather a collection overlying the graft. ? ?LABS: His labs today show po

## 2021-09-21 ENCOUNTER — Encounter (HOSPITAL_COMMUNITY)
Admission: EM | Disposition: A | Discharge status: Home Health | Payer: Self-pay | Source: Home / Self Care | Attending: Internal Medicine

## 2021-09-21 ENCOUNTER — Other Ambulatory Visit: Payer: Self-pay

## 2021-09-21 ENCOUNTER — Inpatient Hospital Stay (HOSPITAL_COMMUNITY): Payer: Medicare Other | Admitting: Certified Registered Nurse Anesthetist

## 2021-09-21 ENCOUNTER — Encounter (HOSPITAL_COMMUNITY): Payer: Self-pay | Admitting: Internal Medicine

## 2021-09-21 DIAGNOSIS — Z87891 Personal history of nicotine dependence: Secondary | ICD-10-CM

## 2021-09-21 DIAGNOSIS — I739 Peripheral vascular disease, unspecified: Secondary | ICD-10-CM

## 2021-09-21 DIAGNOSIS — I898 Other specified noninfective disorders of lymphatic vessels and lymph nodes: Secondary | ICD-10-CM

## 2021-09-21 DIAGNOSIS — I110 Hypertensive heart disease with heart failure: Secondary | ICD-10-CM

## 2021-09-21 DIAGNOSIS — I509 Heart failure, unspecified: Secondary | ICD-10-CM

## 2021-09-21 DIAGNOSIS — T8149XA Infection following a procedure, other surgical site, initial encounter: Secondary | ICD-10-CM | POA: Diagnosis not present

## 2021-09-21 HISTORY — PX: APPLICATION OF WOUND VAC: SHX5189

## 2021-09-21 HISTORY — PX: DRAINAGE AND CLOSURE OF LYMPHOCELE: SHX5800

## 2021-09-21 LAB — HEPATITIS B SURFACE ANTIBODY,QUALITATIVE: Hep B S Ab: REACTIVE — AB

## 2021-09-21 LAB — BASIC METABOLIC PANEL
Anion gap: 12 (ref 5–15)
BUN: 33 mg/dL — ABNORMAL HIGH (ref 8–23)
CO2: 29 mmol/L (ref 22–32)
Calcium: 9.2 mg/dL (ref 8.9–10.3)
Chloride: 96 mmol/L — ABNORMAL LOW (ref 98–111)
Creatinine, Ser: 8.21 mg/dL — ABNORMAL HIGH (ref 0.61–1.24)
GFR, Estimated: 7 mL/min — ABNORMAL LOW (ref >60)
Glucose, Bld: 94 mg/dL (ref 70–99)
Potassium: 4.2 mmol/L (ref 3.5–5.1)
Sodium: 137 mmol/L (ref 135–145)

## 2021-09-21 LAB — CBC
HCT: 28.1 % — ABNORMAL LOW (ref 39.0–52.0)
Hemoglobin: 8.9 g/dL — ABNORMAL LOW (ref 13.0–17.0)
MCH: 33.7 pg (ref 26.0–34.0)
MCHC: 31.7 g/dL (ref 30.0–36.0)
MCV: 106.4 fL — ABNORMAL HIGH (ref 80.0–100.0)
Platelets: 169 10*3/uL (ref 150–400)
RBC: 2.64 MIL/uL — ABNORMAL LOW (ref 4.22–5.81)
RDW: 16.9 % — ABNORMAL HIGH (ref 11.5–15.5)
WBC: 12.2 10*3/uL — ABNORMAL HIGH (ref 4.0–10.5)
nRBC: 0 % (ref 0.0–0.2)

## 2021-09-21 LAB — HEPATITIS B SURFACE ANTIGEN: Hepatitis B Surface Ag: NONREACTIVE

## 2021-09-21 LAB — MRSA NEXT GEN BY PCR, NASAL: MRSA by PCR Next Gen: DETECTED — AB

## 2021-09-21 SURGERY — DRAINAGE AND CLOSURE OF LYMPHOCELE
Anesthesia: General | Site: Groin | Laterality: Right

## 2021-09-21 MED ORDER — LIDOCAINE 2% (20 MG/ML) 5 ML SYRINGE
INTRAMUSCULAR | Status: DC | PRN
  Administered 2021-09-21: 60 mg via INTRAVENOUS

## 2021-09-21 MED ORDER — DEXAMETHASONE SODIUM PHOSPHATE 10 MG/ML IJ SOLN
INTRAMUSCULAR | Status: AC
  Filled 2021-09-21: qty 1

## 2021-09-21 MED ORDER — CHLORHEXIDINE GLUCONATE 0.12 % MT SOLN: 15.0000 mL | Freq: Once | OROMUCOSAL | Status: AC

## 2021-09-21 MED ORDER — PHENYLEPHRINE 40 MCG/ML (10ML) SYRINGE FOR IV PUSH (FOR BLOOD PRESSURE SUPPORT)
PREFILLED_SYRINGE | INTRAVENOUS | Status: DC | PRN
Start: 2021-09-21 — End: 2021-09-21
  Administered 2021-09-21: 120 ug via INTRAVENOUS
  Administered 2021-09-21: 80 ug via INTRAVENOUS
  Administered 2021-09-21: 120 ug via INTRAVENOUS

## 2021-09-21 MED ORDER — ROCURONIUM BROMIDE 10 MG/ML (PF) SYRINGE
PREFILLED_SYRINGE | INTRAVENOUS | Status: AC
  Filled 2021-09-21: qty 10

## 2021-09-21 MED ORDER — CHLORHEXIDINE GLUCONATE CLOTH 2 % EX PADS
6.0000 | MEDICATED_PAD | Freq: Every day | CUTANEOUS | Status: DC
  Administered 2021-09-22 – 2021-09-23 (×2): 6 via TOPICAL

## 2021-09-21 MED ORDER — MIDAZOLAM HCL 2 MG/2ML IJ SOLN
INTRAMUSCULAR | Status: DC | PRN
  Administered 2021-09-21: 1 mg via INTRAVENOUS

## 2021-09-21 MED ORDER — SUGAMMADEX SODIUM 200 MG/2ML IV SOLN
INTRAVENOUS | Status: DC | PRN
Start: 2021-09-21 — End: 2021-09-21
  Administered 2021-09-21 (×2): 200 mg via INTRAVENOUS

## 2021-09-21 MED ORDER — OXYCODONE-ACETAMINOPHEN 5-325 MG PO TABS: 1.0000 | ORAL_TABLET | ORAL | Status: DC | PRN

## 2021-09-21 MED ORDER — FENTANYL CITRATE (PF) 250 MCG/5ML IJ SOLN
INTRAMUSCULAR | Status: DC | PRN
Start: 2021-09-21 — End: 2021-09-21
  Administered 2021-09-21: 50 ug via INTRAVENOUS

## 2021-09-21 MED ORDER — DOXERCALCIFEROL 4 MCG/2ML IV SOLN: 5.0000 ug | INTRAVENOUS | Status: DC

## 2021-09-21 MED ORDER — ONDANSETRON HCL 4 MG/2ML IJ SOLN
INTRAMUSCULAR | Status: AC
  Filled 2021-09-21: qty 2

## 2021-09-21 MED ORDER — SODIUM CHLORIDE 0.9 % IR SOLN
Status: DC | PRN
  Administered 2021-09-21: 1000 mL

## 2021-09-21 MED ORDER — ORAL CARE MOUTH RINSE: 15.0000 mL | Freq: Once | OROMUCOSAL | Status: AC

## 2021-09-21 MED ORDER — LIDOCAINE 2% (20 MG/ML) 5 ML SYRINGE
INTRAMUSCULAR | Status: AC
  Filled 2021-09-21: qty 5

## 2021-09-21 MED ORDER — ONDANSETRON HCL 4 MG/2ML IJ SOLN
INTRAMUSCULAR | Status: DC | PRN
  Administered 2021-09-21: 4 mg via INTRAVENOUS

## 2021-09-21 MED ORDER — FENTANYL CITRATE (PF) 100 MCG/2ML IJ SOLN: 25.0000 ug | INTRAMUSCULAR | Status: DC | PRN

## 2021-09-21 MED ORDER — CHLORHEXIDINE GLUCONATE 0.12 % MT SOLN
OROMUCOSAL | Status: AC
  Administered 2021-09-21: 15 mL via OROMUCOSAL
  Filled 2021-09-21: qty 15

## 2021-09-21 MED ORDER — PHENYLEPHRINE HCL-NACL 20-0.9 MG/250ML-% IV SOLN
INTRAVENOUS | Status: DC | PRN
  Administered 2021-09-21: 30 ug/min via INTRAVENOUS

## 2021-09-21 MED ORDER — MIDAZOLAM HCL 2 MG/2ML IJ SOLN
INTRAMUSCULAR | Status: AC
  Filled 2021-09-21: qty 2

## 2021-09-21 MED ORDER — PROPOFOL 10 MG/ML IV BOLUS
INTRAVENOUS | Status: AC
  Filled 2021-09-21: qty 20

## 2021-09-21 MED ORDER — DEXAMETHASONE SODIUM PHOSPHATE 10 MG/ML IJ SOLN
INTRAMUSCULAR | Status: DC | PRN
  Administered 2021-09-21: 5 mg via INTRAVENOUS

## 2021-09-21 MED ORDER — ROCURONIUM BROMIDE 10 MG/ML (PF) SYRINGE
PREFILLED_SYRINGE | INTRAVENOUS | Status: DC | PRN
Start: 2021-09-21 — End: 2021-09-21
  Administered 2021-09-21: 50 mg via INTRAVENOUS

## 2021-09-21 MED ORDER — PROPOFOL 10 MG/ML IV BOLUS
INTRAVENOUS | Status: DC | PRN
  Administered 2021-09-21: 130 mg via INTRAVENOUS

## 2021-09-21 MED ORDER — FENTANYL CITRATE (PF) 250 MCG/5ML IJ SOLN
INTRAMUSCULAR | Status: AC
  Filled 2021-09-21: qty 5

## 2021-09-21 MED ORDER — CHLORHEXIDINE GLUCONATE CLOTH 2 % EX PADS: 6.0000 | MEDICATED_PAD | Freq: Every day | CUTANEOUS | Status: DC

## 2021-09-21 MED ORDER — MUPIROCIN 2 % EX OINT
1.0000 "application " | TOPICAL_OINTMENT | Freq: Two times a day (BID) | CUTANEOUS | Status: DC
  Administered 2021-09-21 – 2021-09-23 (×5): 1 via NASAL
  Filled 2021-09-21 (×4): qty 22

## 2021-09-21 MED ORDER — SODIUM CHLORIDE 0.9 % IV SOLN: INTRAVENOUS | Status: DC

## 2021-09-21 MED ORDER — PHENYLEPHRINE 40 MCG/ML (10ML) SYRINGE FOR IV PUSH (FOR BLOOD PRESSURE SUPPORT)
PREFILLED_SYRINGE | INTRAVENOUS | Status: AC
  Filled 2021-09-21: qty 10

## 2021-09-21 SURGICAL SUPPLY — 53 items
BAG COUNTER SPONGE SURGICOUNT (BAG) ×3 IMPLANT
BAG SPNG CNTER NS LX DISP (BAG) ×1
BANDAGE ESMARK 6X9 LF (GAUZE/BANDAGES/DRESSINGS) IMPLANT
BNDG CMPR 9X6 STRL LF SNTH (GAUZE/BANDAGES/DRESSINGS)
BNDG ESMARK 6X9 LF (GAUZE/BANDAGES/DRESSINGS)
CANISTER SUCT 3000ML PPV (MISCELLANEOUS) ×3 IMPLANT
CLIP TI MEDIUM 6 (CLIP) ×1 IMPLANT
CLIP TI WIDE RED SMALL 6 (CLIP) ×1 IMPLANT
CUFF TOURN SGL QUICK 18X4 (TOURNIQUET CUFF) IMPLANT
CUFF TOURN SGL QUICK 24 (TOURNIQUET CUFF)
CUFF TOURN SGL QUICK 34 (TOURNIQUET CUFF)
CUFF TOURN SGL QUICK 42 (TOURNIQUET CUFF) IMPLANT
CUFF TRNQT CYL 24X4X16.5-23 (TOURNIQUET CUFF) IMPLANT
CUFF TRNQT CYL 34X4.125X (TOURNIQUET CUFF) IMPLANT
DRAIN CHANNEL 15F RND FF W/TCR (WOUND CARE) IMPLANT
DRSG VAC ATS MED SENSATRAC (GAUZE/BANDAGES/DRESSINGS) ×1 IMPLANT
DRSG VERAFLO VAC MED (GAUZE/BANDAGES/DRESSINGS) ×1 IMPLANT
DRSG VERSA FOAM LRG 10X15 (GAUZE/BANDAGES/DRESSINGS) ×1 IMPLANT
ELECT REM PT RETURN 9FT ADLT (ELECTROSURGICAL) ×2
ELECTRODE REM PT RTRN 9FT ADLT (ELECTROSURGICAL) ×2 IMPLANT
EVACUATOR SILICONE 100CC (DRAIN) IMPLANT
GLOVE BIO SURGEON STRL SZ7.5 (GLOVE) ×3 IMPLANT
GLOVE BIOGEL PI IND STRL 8 (GLOVE) ×2 IMPLANT
GLOVE BIOGEL PI INDICATOR 8 (GLOVE) ×1
GLOVE SURG POLY ORTHO LF SZ7.5 (GLOVE) IMPLANT
GLOVE SURG UNDER LTX SZ8 (GLOVE) ×3 IMPLANT
GOWN STRL REUS W/ TWL LRG LVL3 (GOWN DISPOSABLE) ×6 IMPLANT
GOWN STRL REUS W/TWL LRG LVL3 (GOWN DISPOSABLE) ×6
KIT BASIN OR (CUSTOM PROCEDURE TRAY) ×3 IMPLANT
KIT TURNOVER KIT B (KITS) ×3 IMPLANT
NS IRRIG 1000ML POUR BTL (IV SOLUTION) ×5 IMPLANT
PACK CV ACCESS (CUSTOM PROCEDURE TRAY) IMPLANT
PACK GENERAL/GYN (CUSTOM PROCEDURE TRAY) ×1 IMPLANT
PACK PERIPHERAL VASCULAR (CUSTOM PROCEDURE TRAY) IMPLANT
PACK UNIVERSAL I (CUSTOM PROCEDURE TRAY) ×1 IMPLANT
PAD ARMBOARD 7.5X6 YLW CONV (MISCELLANEOUS) ×6 IMPLANT
STAPLER VISISTAT (STAPLE) IMPLANT
SUT MNCRL AB 4-0 PS2 18 (SUTURE) IMPLANT
SUT PROLENE 5 0 C 1 24 (SUTURE) IMPLANT
SUT PROLENE 6 0 BV (SUTURE) IMPLANT
SUT SILK 2 0 (SUTURE) ×2
SUT SILK 2-0 18XBRD TIE 12 (SUTURE) IMPLANT
SUT SILK 3 0 (SUTURE) ×2
SUT SILK 3-0 18XBRD TIE 12 (SUTURE) IMPLANT
SUT VIC AB 2-0 CTB1 (SUTURE) IMPLANT
SUT VIC AB 3-0 SH 27 (SUTURE)
SUT VIC AB 3-0 SH 27X BRD (SUTURE) IMPLANT
SWAB CULTURE ESWAB REG 1ML (MISCELLANEOUS) ×1 IMPLANT
SWAB CULTURE LIQ STUART DBL (MISCELLANEOUS) ×1 IMPLANT
TOWEL GREEN STERILE (TOWEL DISPOSABLE) ×3 IMPLANT
TRAY FOLEY MTR SLVR 16FR STAT (SET/KITS/TRAYS/PACK) IMPLANT
UNDERPAD 30X36 HEAVY ABSORB (UNDERPADS AND DIAPERS) ×3 IMPLANT
WATER STERILE IRR 1000ML POUR (IV SOLUTION) ×3 IMPLANT

## 2021-09-21 NOTE — Anesthesia Preprocedure Evaluation (Signed)
Anesthesia Evaluation  ?Patient identified by MRN, date of birth, ID band ?Patient awake ? ? ? ?Reviewed: ?Allergy & Precautions, NPO status , Patient's Chart, lab work & pertinent test results ? ?Airway ?Mallampati: II ? ?TM Distance: >3 FB ? ? ? ? Dental ?  ?Pulmonary ?asthma , pneumonia, COPD, former smoker,  ?  ?breath sounds clear to auscultation ? ? ? ? ? ? Cardiovascular ?hypertension, + Peripheral Vascular Disease and +CHF  ? ?Rhythm:Regular Rate:Normal ? ? ?  ?Neuro/Psych ?  ? GI/Hepatic ?Neg liver ROS, GERD  ,  ?Endo/Other  ? ? Renal/GU ?Renal disease  ? ?  ?Musculoskeletal ? ? Abdominal ?  ?Peds ? Hematology ?  ?Anesthesia Other Findings ? ? Reproductive/Obstetrics ? ?  ? ? ? ? ? ? ? ? ? ? ? ? ? ?  ?  ? ? ? ? ? ? ? ? ?Anesthesia Physical ?Anesthesia Plan ? ?ASA: 3 ? ?Anesthesia Plan: General  ? ?Post-op Pain Management:   ? ?Induction: Intravenous ? ?PONV Risk Score and Plan: 2 and Ondansetron, Dexamethasone and Midazolam ? ?Airway Management Planned: Oral ETT ? ?Additional Equipment:  ? ?Intra-op Plan:  ? ?Post-operative Plan: Extubation in OR ? ?Informed Consent: I have reviewed the patients History and Physical, chart, labs and discussed the procedure including the risks, benefits and alternatives for the proposed anesthesia with the patient or authorized representative who has indicated his/her understanding and acceptance.  ? ? ? ?Dental advisory given ? ?Plan Discussed with: CRNA and Anesthesiologist ? ?Anesthesia Plan Comments:   ? ? ? ? ? ? ?Anesthesia Quick Evaluation ? ?

## 2021-09-21 NOTE — Consult Note (Signed)
WOC Nurse Consult Note: ?Reason for Consult:NPWT to right groin.  ?  ?[place] is in receipt of consultation for NPWT to right groin site. ? ?First dressing change is ordered for TUES, 09/24/21. WOC to coordinate with [facility] PA-C to be present for change or to assess prior to dressing replacement. ? ?Dr. Scot Dock has ordered white foam as wound contact layer topped with black foam. Note:  155mmHg continuous negative pressure  ?Wound type:Surgical ?Pressure Injury POA:N/A ? ?[facility] nursing team will follow, and will remain available to this patient, the nursing and medical teams.   ? ?Thanks, ?Maudie Flakes, MSN, RN, [place], [place], CWON-AP, [place]  ?Pager# [phone]  ? ? ? ?  ?

## 2021-09-21 NOTE — Interval H&P Note (Signed)
History and Physical Interval Note: ? ?09/21/2021 ?7:15 AM ? ?Albert Harris  has presented today for surgery, with the diagnosis of lymphocele right groin.  The various methods of treatment have been discussed with the patient and family. After consideration of risks, benefits and other options for treatment, the patient has consented to  Procedure(s): ?EVACUATION OF LYMPHOCELE RIGHT GROIN (Right) as a surgical intervention.  [name] history has been reviewed, patient examined, no change in status, stable for surgery.  I have reviewed the patient's chart and labs.  Questions were answered to the patient's satisfaction.   ? ? ?[name] ? ? ?

## 2021-09-21 NOTE — Transfer of Care (Signed)
Immediate Anesthesia Transfer of Care Note ? ?Patient: Albert Harris ? ?Procedure(s) Performed: EVACUATION OF LYMPHOCELE RIGHT GROIN (Right: Groin) ?APPLICATION OF WOUND VAC (Right: Groin) ? ?Patient Location: PACU ? ?Anesthesia Type:General ? ?Level of Consciousness: drowsy, patient cooperative and responds to stimulation ? ?Airway & Oxygen Therapy: Patient Spontanous Breathing ? ?Post-op Assessment: Report given to RN and Post -op Vital signs reviewed and stable ? ?Post vital signs: Reviewed and stable ? ?Last Vitals:  ?Vitals Value Taken Time  ?BP 148/72 09/21/21 0821  ?Temp    ?Pulse 68 09/21/21 0822  ?Resp 22 09/21/21 0822  ?SpO2 91 % 09/21/21 0822  ?Vitals shown include unvalidated device data. ? ?Last Pain:  ?Vitals:  ? 09/21/21 0704  ?TempSrc:   ?PainSc: 4   ?   ? ?Patients Stated Pain Goal: 0 (09/21/21 0704) ? ?Complications: No notable events documented. ?

## 2021-09-21 NOTE — Plan of Care (Signed)

## 2021-09-21 NOTE — Anesthesia Procedure Notes (Signed)
Procedure Name: Intubation ?Date/Time: 09/21/2021 7:40 AM ?Performed by: Janace Litten, CRNA ?Pre-anesthesia Checklist: Patient identified, Emergency Drugs available, Suction available and Patient being monitored ?Patient Re-evaluated:Patient Re-evaluated prior to induction ?Oxygen Delivery Method: Circle System Utilized ?Preoxygenation: Pre-oxygenation with 100% oxygen ?Induction Type: IV induction ?Ventilation: Mask ventilation without difficulty and Oral airway inserted - appropriate to patient size ?Laryngoscope Size: Mac and 4 ?Grade View: Grade I ?Tube type: Oral ?Tube size: 7.5 mm ?Number of attempts: 1 ?Airway Equipment and Method: Stylet and Oral airway ?Placement Confirmation: ETT inserted through vocal cords under direct vision, positive ETCO2 and breath sounds checked- equal and bilateral ?Secured at: 23 cm ?Tube secured with: Tape ?Dental Injury: Teeth and Oropharynx as per pre-operative assessment  ? ? ? ? ?

## 2021-09-21 NOTE — Plan of Care (Signed)
  Problem: Activity: Goal: Risk for activity intolerance will decrease Outcome: Progressing   Problem: Nutrition: Goal: Adequate nutrition will be maintained Outcome: Progressing   Problem: Coping: Goal: Level of anxiety will decrease Outcome: Progressing   Problem: Pain Managment: Goal: General experience of comfort will improve Outcome: Progressing   Problem: Safety: Goal: Ability to remain free from injury will improve Outcome: Progressing   

## 2021-09-21 NOTE — Op Note (Signed)
? ? ?  NAME: [name]    MRN: [id number] ?DOB: 1956/05/10    DATE OF OPERATION: 09/21/2021 ? ?PREOP DIAGNOSIS:   ? ?Lymphocele right groin ? ?POSTOP DIAGNOSIS:   ? ?Same ? ?PROCEDURE:  ?  ?Evacuation lymphocele right groin and placement of VAC ? ?SURGEON: Judeth Cornfield. Scot Dock, MD ? ?ASSIST: None ? ?ANESTHESIA: General ? ?EBL: None ? ?INDICATIONS:  ? ? Albert Harris is a 66 y.o. male who underwent a previous iliofemoral endarterectomy and right femoral to below-knee popliteal artery bypass with vein he developed a lymphocele and swelling in the right leg.  Duplex scan showed no evidence of DVT.  He presents for evacuation lymphocele ? ?FINDINGS:  ? ?There was a coagulated lymphocele in the right groin which was evacuated.  No clear source of lymphatic drainage was identified.  The graft was covered.  The graft did have a good pulse.  A VAC was placed. ? ?The wound measured 3 cm in depth by 10 cm in length by 2 cm in width. ? ?TECHNIQUE:  ? ?The patient was taken to the operating room and received a general anesthetic.  The right groin was prepped and draped in usual sterile fashion.  The previous incision was opened and lymphatic fluid which was somewhat cloudy was cultured.  The majority of the lymphocele was gelatinous and this was evacuated.  I then thoroughly irrigated the wound.  No clear lymphatic source could be identified.  The graft itself was covered.  There was a pulse palpable in the graft.  There was no evidence of bleeding.  A white sponge was placed at the base of the wound and then black sponge placed on top of this.  The VAC was placed and there was an excellent seal.  The patient was transferred to the recovery room in stable condition.  All needle and sponge counts were correct. ? ?[name], MD, FACS ?[facility] ? ?DATE OF DICTATION:   09/21/2021 ? ?

## 2021-09-21 NOTE — Progress Notes (Addendum)
Internal Medicine Attending:  ? ?I saw and examined the patient. I reviewed the resident?s H&P note and I agree with the resident?s findings and plan as documented in the resident?s note. ? ?In brief, patient is a 66 year old male with past medical history of ESRD on hemodialysis, hypertension, hyperlipidemia, chronic systolic heart failure, severe PAD with recent admission for right foot wound status post right femoral endarterectomy and femoral to below-knee popliteal bypass on March 31 who presented with worsening pain and swelling in his groin since discharge.  Patient denies any fevers but did complain of some chills and some associated nausea.  In the ED, patient was evaluated by vascular surgery who aspirated some cloudy lymphatic fluid from the right groin concerning for an infected lymphocele.  Today, patient complains of some pain in his right groin status post evacuation of the infected lymphocele. ? ?On exam, patient is resting in bed in no apparent distress.  Lungs are clear to auscultation bilaterally.  Cardiovascular exam shows regular rate and rhythm with normal heart sounds.  Abdomen is soft, nontender, nondistended with normoactive bowel sounds.  1-2+ right lower extremity pitting edema noted on exam, right lower extremity is mildly tender to palpation.  Skin exam reveals wound VAC placement over right groin surgical site.  Patient has a normal mood and affect. ? ?Patient was admitted to the hospital with infected right lymphocele at prior surgical site.  He is status post evacuation of the lymphocele.  We will follow-up surgical cultures.  We will continue IV vancomycin for now.  Leukocytosis mildly improved today to 12.2 from 13.8.  Wound VAC is in place.  Vascular surgery follow-up recommendations are appreciated.  No further work-up at this time.  We will try to narrow antibiotic coverage once cultures have resulted. ? ?Of note, patient does have ESRD on hemodialysis and missed his hemodialysis  yesterday secondary to coming to the ED for evaluation.  Will consult nephrology for possible hemodialysis today.  No further work-up at this time. ?

## 2021-09-21 NOTE — Anesthesia Postprocedure Evaluation (Signed)
Anesthesia Post Note ? ?Patient: Albert Harris ? ?Procedure(s) Performed: EVACUATION OF LYMPHOCELE RIGHT GROIN (Right: Groin) ?APPLICATION OF WOUND VAC (Right: Groin) ? ?  ? ?Patient location during evaluation: PACU ?Anesthesia Type: General ?Level of consciousness: awake ?Pain management: pain level controlled ?Vital Signs Assessment: post-procedure vital signs reviewed and stable ?Respiratory status: spontaneous breathing ?Cardiovascular status: stable ?Postop Assessment: no apparent nausea or vomiting ?Anesthetic complications: no ? ? ?No notable events documented. ? ?Last Vitals:  ?Vitals:  ? 09/21/21 0847 09/21/21 0911  ?BP: 136/81 (!) 146/77  ?Pulse: 66 71  ?Resp: 18   ?Temp:  (!) 36.4 ?C  ?SpO2: 100% 100%  ?  ?Last Pain:  ?Vitals:  ? 09/21/21 0911  ?TempSrc: Oral  ?PainSc:   ? ? ?  ?  ?  ?  ?  ?  ? ?Deloy Archey ? ? ? ? ?

## 2021-09-21 NOTE — Progress Notes (Signed)
? ?Subjective: Albert Harris ? ?Patient went to procedure this AM for lymphocele evacuation, which he reports tolerating well.  He endorses some pain this morning only in the surgical area, denying all other pain.  He reports no dyspnea today.  He has an intact appetite, and feels ready to eat.  No further acute concerns or complaints. ? ?Objective: ? ?Vital signs in last 24 hours: ?Vitals:  ? 09/21/21 0820 09/21/21 0835 09/21/21 0847 09/21/21 0911  ?BP: (!) 148/72 (!) 144/77 136/81 (!) 146/77  ?Pulse: 66 64 66 71  ?Resp: (!) 22 19 18    ?Temp: 97.9 ?F (36.6 ?C)   (!) 97.5 ?F (36.4 ?C)  ?TempSrc:    Oral  ?SpO2: 95% 98% 100% 100%  ?Weight:      ? ?General: Chronically ill-appearing male lying in bed in acute pain ?HENT: normocephalic, atraumatic, external nares and ears appear normal ?EYES: conjunctiva non-erythematous, no scleral icterus ?CV: regular rate, abnormal rhythm, no murmurs, rubs, gallops.  RLE with 1-2+ edema from knee to foot; TTP.  ?Pulmonary: normal work of breathing on RA, lungs clear to auscultation bilaterally ?Abdominal: non-distended, soft, non-tender to palpation, normal BS all 4 quadrants ?Skin: Warm and dry.  Right groin with erythema and warmth in the lateral suprapubic area, extending to intertriginous fold.  Wound VAC in place, no drainage from surgical site. ?Neurological: Awake, alert and oriented x3. No focal deficits of CNII-XII. ?Psych: normal affect and behavior  ? ?Assessment/Plan: ?Albert Harris is a 66 year old male with past medical history of ESRD on HD, severe PAD/significant calcific disease of the common femoral artery s/p Viabahn stent, and recent admission in which endarterectomy of the common femoral artery was completed who presented for progressively worsening pain and swelling in the groin since discharge, now s/p lymphocele evacuation. ? ?Principal Problem: ?  Surgical wound infection ? ?#Surgical wound infection/Abscess of R groin ?Patient s/p endarterectomy of the right common  femoral artery.  Now with abscess, which vascular surgery was able to express purulent drainage.  WBC 13.8, ANC 11.0, but patient afebrile. Vascular surgery performed evacuation of lymphocele this a.m.; appreciate assistance in patient's care. ?- Continue IV vancomycin ?- Awaiting vascular surgery follow-up recommendations ?- Wound care with wound VAC; first dressing change Tuesday, 4/19. ?-Follow-up surgical cultures; will narrow antibiotics as available. ?- CTM WBC on daily CBC ?- Continue to closely monitor vitals ?- Patient with renal diet and fluid restriction to 1.5 L. ?  ?#ESRD on HD TThS ?Patient has history of ESRD on HD TThSa.  Patient missed HD 4/15 due to pain, last session 4/13. He has AV fistula in his right arm. Nephrology following, currently no indication for HD ahead of schedule. ?--Avoid nephrotoxic medications ?--Dose medications renally ?  ?#HTN ?On admission, BP this a.m 136/81. Patient's home medications are amlodipine 10mg  daily and metoprolol 25mg  BID.  To be continued throughout admission. ?  ?#HFrEF ?LVEF estimated at 35-40% on TEE done 05/13/21. Patient does not report taking any home medications. ?--Keep Mg >2.0, K+ 4.0 ?  ?#COPD ?Patient has history of COPD and is a former smoker, with 100 pack-year history. He quit smoking in 2021. Patient has albuterol inhaler at home as needed, but reports that he does not use it. ?--PRN albuterol nebulizer  ? ?Prior to Admission Living Arrangement: Home ?Anticipated Discharge Location: Pending PT/OT ?Barriers to Discharge: Surgical recovery ?Dispo: Anticipated discharge in approximately 2-3 day(s).  ? Albert Schlatter, MD ?09/21/2021, 11:48 AM ?Pager: [phone] ?After 5pm on weekdays and 1pm on weekends:  On Call pager [phone]  ?

## 2021-09-22 ENCOUNTER — Encounter (HOSPITAL_COMMUNITY): Payer: Self-pay | Admitting: Vascular Surgery

## 2021-09-22 DIAGNOSIS — T8149XA Infection following a procedure, other surgical site, initial encounter: Secondary | ICD-10-CM

## 2021-09-22 LAB — CBC
HCT: 31.9 % — ABNORMAL LOW (ref 39.0–52.0)
Hemoglobin: 9.7 g/dL — ABNORMAL LOW (ref 13.0–17.0)
MCH: 32.6 pg (ref 26.0–34.0)
MCHC: 30.4 g/dL (ref 30.0–36.0)
MCV: 107 fL — ABNORMAL HIGH (ref 80.0–100.0)
Platelets: 200 10*3/uL (ref 150–400)
RBC: 2.98 MIL/uL — ABNORMAL LOW (ref 4.22–5.81)
RDW: 16.7 % — ABNORMAL HIGH (ref 11.5–15.5)
WBC: 10.4 10*3/uL (ref 4.0–10.5)
nRBC: 0 % (ref 0.0–0.2)

## 2021-09-22 LAB — BASIC METABOLIC PANEL
Anion gap: 15 (ref 5–15)
BUN: 53 mg/dL — ABNORMAL HIGH (ref 8–23)
CO2: 24 mmol/L (ref 22–32)
Calcium: 9.5 mg/dL (ref 8.9–10.3)
Chloride: 97 mmol/L — ABNORMAL LOW (ref 98–111)
Creatinine, Ser: 9.87 mg/dL — ABNORMAL HIGH (ref 0.61–1.24)
GFR, Estimated: 5 mL/min — ABNORMAL LOW (ref >60)
Glucose, Bld: 123 mg/dL — ABNORMAL HIGH (ref 70–99)
Potassium: 5.7 mmol/L — ABNORMAL HIGH (ref 3.5–5.1)
Sodium: 136 mmol/L (ref 135–145)

## 2021-09-22 LAB — HEPATITIS B SURFACE ANTIBODY, QUANTITATIVE: Hep B S AB Quant (Post): 31.6 m[IU]/mL (ref >9.9)

## 2021-09-22 MED ORDER — VANCOMYCIN HCL 750 MG/150ML IV SOLN
750.0000 mg | Freq: Once | INTRAVENOUS | Status: AC
  Administered 2021-09-22: 750 mg via INTRAVENOUS
  Filled 2021-09-22: qty 150

## 2021-09-22 MED ORDER — VANCOMYCIN HCL 750 MG/150ML IV SOLN: 750.0000 mg | INTRAVENOUS | Status: DC

## 2021-09-22 MED ORDER — POLYETHYLENE GLYCOL 3350 17 G PO PACK
17.0000 g | PACK | Freq: Every day | ORAL | Status: DC
  Administered 2021-09-22: 17 g via ORAL
  Filled 2021-09-22: qty 1

## 2021-09-22 MED ORDER — ACETAMINOPHEN 500 MG PO TABS
1000.0000 mg | ORAL_TABLET | Freq: Three times a day (TID) | ORAL | Status: DC
Start: 2021-09-22 — End: 2021-09-23
  Administered 2021-09-22 – 2021-09-23 (×3): 1000 mg via ORAL
  Filled 2021-09-22 (×3): qty 2

## 2021-09-22 NOTE — Progress Notes (Signed)
? ?  VASCULAR SURGERY ASSESSMENT & PLAN:  ? ?POD 1 EVACUATION OF LYMPHOCELE RIGHT GROIN AND PLACEMENT OF VAC: He had a localized lymphocele at the superior aspect of his incision.  No site of lymphatic drainage was identified.  I think the best chance for sealing this is to continue his VAC.  He will need to have this changed on Monday Wednesday and Friday starting on Wednesday.  From our standpoint he can be discharged once his VAC is approved at home. ? ?ID: The Gram stain shows few gram-positive cocci in pairs.  The culture is pending.  He is on vancomycin IV.  We can adjust his antibiotics once his culture is final. ? ?DVT PROPHYLAXIS: He is on subcu heparin.  ? ?Continue leg elevation as ordered. ? ?SUBJECTIVE:  ? ?No complaints this morning.  He said there was some drainage on his gown however the VAC has an excellent seal not sure what this would have been unless it was urine. ? ?PHYSICAL EXAM:  ? ?Vitals:  ? 09/21/21 1450 09/21/21 2036 09/22/21 0229 09/22/21 0535  ?BP: (!) 141/80 (!) 160/86  (!) 175/93  ?Pulse: 65 75  72  ?Resp:    16  ?Temp:  97.8 ?F (36.6 ?C)  98.2 ?F (36.8 ?C)  ?TempSrc:  Oral    ?SpO2: 96% 100%  98%  ?Weight:   84.6 kg   ?Height:   5\' 8"  (1.727 m)   ? ?His VAC has an excellent seal. ?100 cc of drainage is recorded for his VAC. ?The swelling in his right leg is much improved. ? ?LABS:  ? ?Lab Results  ?Component Value Date  ? WBC 10.4 09/22/2021  ? HGB 9.7 (L) 09/22/2021  ? HCT 31.9 (L) 09/22/2021  ? MCV 107.0 (H) 09/22/2021  ? PLT 200 09/22/2021  ? ?Lab Results  ?Component Value Date  ? CREATININE 9.87 (H) 09/22/2021  ? ?Lab Results  ?Component Value Date  ? INR 1.3 (H) 09/05/2021  ? ? ?PROBLEM LIST:   ? ?Principal Problem: ?  Surgical wound infection ? ? ?CURRENT MEDS:  ? ? amLODipine  10 mg Oral Daily  ? aspirin EC  81 mg Oral Daily  ? atorvastatin  80 mg Oral Daily  ? calcium acetate  1,334 mg Oral TID WC  ? Chlorhexidine Gluconate Cloth  6 each Topical Q0600  ? [START ON 09/23/2021]  doxercalciferol  5 mcg Intravenous Q T,Th,Sa-HD  ? heparin  5,000 Units Subcutaneous Q8H  ? metoprolol tartrate  25 mg Oral BID  ? mupirocin ointment  1 application. Nasal BID  ? ? ?[name] ?Office: [phone] ?09/22/2021 ? ?

## 2021-09-22 NOTE — Progress Notes (Signed)
?[facility] ?Progress Note  ? ?Subjective:  Seen in room. C/o mild dyspnea when laying, better when sitting up. No CP. Had some oozing from R groin incision overnight, looks completely dry now. Wound Cx growing Staph aureus - on Vanc. For HD later today. ? ?Objective ?Vitals:  ? 09/21/21 2036 09/22/21 0229 09/22/21 0535 09/22/21 6378  ?BP: (!) 160/86  (!) 175/93 (!) 159/93  ?Pulse: 75  72 75  ?Resp:   16 17  ?Temp: 97.8 ?F (36.6 ?C)  98.2 ?F (36.8 ?C) (!) 97.4 ?F (36.3 ?C)  ?TempSrc: Oral   Oral  ?SpO2: 100%  98%   ?Weight:  84.6 kg    ?Height:  5\' 8"  (1.727 m)    ? ?Physical Exam ?General: Well appearing man, NAD. Room air. ?Heart: RRR; no murmur ?Lungs: CTA anteriorly ?Abdomen: soft ?Extremities: No LE edema; RLE with multiple dry incisions going up and wound vac in R groin area ?Dialysis Access: R forearm loop AVG + bruit ? ?Additional Objective ?Labs: ?Basic Metabolic Panel: ?Recent Labs  ?Lab 09/20/21 ?1351 09/20/21 ?2101 09/21/21 ?0139 09/22/21 ?0158  ?NA 138  --  137 136  ?K 3.8  --  4.2 5.7*  ?CL 95*  --  96* 97*  ?CO2 31  --  29 24  ?GLUCOSE 110*  --  94 123*  ?BUN 28*  --  33* 53*  ?CREATININE 7.33*  --  8.21* 9.87*  ?CALCIUM 10.4*  --  9.2 9.5  ?PHOS  --  5.2*  --   --   ? ?Liver Function Tests: ?Recent Labs  ?Lab 09/20/21 ?1351  ?AST 18  ?ALT 8  ?ALKPHOS 116  ?BILITOT 0.6  ?PROT 7.3  ?ALBUMIN 3.2*  ? ?Recent Labs  ?Lab 09/20/21 ?1351  ?LIPASE 38  ? ?CBC: ?Recent Labs  ?Lab 09/20/21 ?1351 09/21/21 ?0139 09/22/21 ?0158  ?WBC 13.8* 12.2* 10.4  ?NEUTROABS 11.0*  --   --   ?HGB 10.0* 8.9* 9.7*  ?HCT 33.4* 28.1* 31.9*  ?MCV 108.1* 106.4* 107.0*  ?PLT 193 169 200  ? ?Blood Culture ?   ?Component Value Date/Time  ? SDES WOUND 09/21/2021 0801  ? SPECREQUEST SWAB FROM RIGHT GROIN [place] A 09/21/2021 0801  ? CULT MODERATE STAPHYLOCOCCUS AUREUS 09/21/2021 0801  ? REPTSTATUS PENDING 09/21/2021 0801  ? ?Studies/Results: ?CT ABDOMEN PELVIS WO CONTRAST ? ?Result Date: 09/20/2021 ?CLINICAL DATA:  Right  groin abscess. Recent right femoral to popliteal artery bypass surgery 2 weeks ago. EXAM: CT ABDOMEN AND PELVIS WITHOUT CONTRAST TECHNIQUE: Multidetector CT imaging of the abdomen and pelvis was performed following the standard protocol without IV contrast. RADIATION DOSE REDUCTION: This exam was performed according to the departmental dose-optimization program which includes automated exposure control, adjustment of the mA and/or kV according to patient size and/or use of iterative reconstruction technique. COMPARISON:  CT pelvis dated September 03, 2021. FINDINGS: Lower chest: No acute abnormality.  Unchanged cardiomegaly. Hepatobiliary: No focal liver abnormality is seen. No gallstones, gallbladder wall thickening, or biliary dilatation. Pancreas: Unremarkable. No pancreatic ductal dilatation or surrounding inflammatory changes. Unchanged splenule in the pancreatic tail. Spleen: Normal in size without focal abnormality. Adrenals/Urinary Tract: Adrenal glands are unremarkable. Unchanged bilateral renal atrophy and renovascular calcification. No renal calculi or hydronephrosis. The bladder is decompressed. Stomach/Bowel: Stomach is within normal limits. Appendix appears normal. No evidence of bowel wall thickening, distention, or inflammatory changes. Left-sided colonic diverticulosis again noted. Vascular/Lymphatic: Aortic atherosclerosis. Left peri-aortic soft tissue thickening at the level of left renal artery origin  with surrounding fat stranding persists but has decreased compared to prior study. Bilateral common iliac and right external iliac artery stents. Postsurgical changes in the right groin with new slightly ill-defined 5.0 x 3.7 cm fluid collection (series 3, image 75). Multiple new mildly enlarged right external iliac and inguinal lymph nodes, likely reactive. Reproductive: Prostate is unremarkable. Other: No free fluid or pneumoperitoneum. Musculoskeletal: New soft tissue swelling in the anterior right  upper thigh. No acute or significant osseous findings. IMPRESSION: 1. Postsurgical changes in the right groin with new slightly ill-defined 5.0 x 3.7 cm fluid collection, presumably an abscess given clinical history. Differential considerations also include hematoma and pseudoaneurysm. Consider further evaluation with ultrasound. 2. 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 3. Aortic Atherosclerosis (ICD10-I70.0). Electronically Signed   By: Titus Dubin M.D.   On: 09/20/2021 15:20  ? ?VAS [place] LOWER EXTREMITY VENOUS (DVT) (7a-7p) ? ?Result Date: 09/20/2021 ? Lower Venous DVT Study Patient Name:  KENTRAVIOUS LIPFORD  Date of Exam:   09/20/2021 Medical Rec #: [id number]       Accession #:    [id number] Date of Birth: [birth year]      Patient Gender: M Patient Age:   66 years Exam Location:  [facility] Procedure:      VAS [place] LOWER EXTREMITY VENOUS (DVT) Referring Phys: Loree Fee PLUNKETT --------------------------------------------------------------------------------  Indications: Swelling.  Risk Factors: Surgery. Limitations: Poor ultrasound/tissue interface, bandages and patient pain tolerance, patient guarding. Comparison Study: No prior studies. Performing Technologist: Oliver Hum RVT  Examination Guidelines: A complete evaluation includes B-mode imaging, spectral Doppler, color Doppler, and power Doppler as needed of all accessible portions of each vessel. Bilateral testing is considered an integral part of a complete examination. Limited examinations for reoccurring indications may be performed as noted. The reflux portion of the exam is performed with the patient in reverse Trendelenburg.  +---------+---------------+---------+-----------+----------+-------------------+ RIGHT     CompressibilityPhasicitySpontaneityPropertiesThrombus Aging      +---------+---------------+---------+-----------+----------+-------------------+ CFV                                                   Not well visualized +---------+---------------+---------+-----------+----------+-------------------+ SFJ                                                   Not well visualized +---------+---------------+---------+-----------+----------+-------------------+ FV Prox  Full           Yes      Yes                                      +---------+---------------+---------+-----------+----------+-------------------+ FV Mid   Full           Yes      Yes                                      +---------+---------------+---------+-----------+----------+-------------------+ FV Distal  Yes      Yes                                      +---------+---------------+---------+-----------+----------+-------------------+ PFV                                                   Not well visualized +---------+---------------+---------+-----------+----------+-------------------+ POP      Full           Yes      Yes                                      +---------+---------------+---------+-----------+----------+-------------------+ PTV      Full                                                             +---------+---------------+---------+-----------+----------+-------------------+ PERO     Full                                                             +---------+---------------+---------+-----------+----------+-------------------+   +----+---------------+---------+-----------+----------+--------------+ LEFTCompressibilityPhasicitySpontaneityPropertiesThrombus Aging +----+---------------+---------+-----------+----------+--------------+ CFV Full           Yes      Yes                                  +----+---------------+---------+-----------+----------+--------------+     Summary: RIGHT: - There is no evidence of deep vein thrombosis in the lower extremity. However, portions of this examination were limited- see technologist comments above.  - A cystic structure is found in the popliteal fossa that extends into the proximal, medial calf.  LEFT: - No evidence of common femoral vein obstr

## 2021-09-22 NOTE — Progress Notes (Signed)
Pt off unit to hemodialysis. 

## 2021-09-22 NOTE — Evaluation (Signed)
Physical Therapy Evaluation ?Patient Details ?Name: Albert Harris ?MRN: [id number] ?DOB: [birth year] ?Today's Date: 09/22/2021 ? ?History of Present Illness ? 66 y.o. male presents to ED on 4/15 with pain, swelling of R groin incision from fempop procedure on 3/31. s/p evacuation of lymphocele R groin and placement of VAC on 4/16. PMH includes s/p 3/31 R CFA endarterectomy and femoral to popliteal bypass with vein, recent history of DVT, R great toe wound, ESRD on HD TTS, severe PAD, hypertension, COPD with tobacco abuse, hyperlipidemia and HFrEF.  ?Clinical Impression ?  ?Pt presents with generalized weakness R>L, R groin and LE pain, impaired activity tolerance vs baseline, and impaired balance. Pt to benefit from acute PT to address deficits. Pt ambulated room distance with RW, overall requiring supervision to min guard for safety. PT anticipates good progress with mobility while acute, recommending mobility specialist see pt daily. PT to progress mobility as tolerated, and will continue to follow acutely.  ?   ?   ? ?Recommendations for follow up therapy are one component of a multi-disciplinary discharge planning process, led by the attending physician.  Recommendations may be updated based on patient status, additional functional criteria and insurance authorization. ? ?Follow Up Recommendations Home health PT ? ?  ?Assistance Recommended at Discharge Intermittent Supervision/Assistance  ?Patient can return home with the following ? A little help with walking and/or transfers;A little help with bathing/dressing/bathroom;Direct supervision/assist for financial management;Assist for transportation;Help with stairs or ramp for entrance ? ?  ?Equipment Recommendations None recommended by PT  ?Recommendations for Other Services ?    ?  ?Functional Status Assessment Patient has had a recent decline in their functional status and demonstrates the ability to make significant improvements in function in a reasonable and  predictable amount of time.  ? ?  ?Precautions / Restrictions Precautions ?Precautions: Fall ?Restrictions ?Weight Bearing Restrictions: No  ? ?  ? ?Mobility ? Bed Mobility ?Overal bed mobility: Modified Independent ?  ?  ?  ?  ?  ?  ?General bed mobility comments: increased time with use of bedrail ?  ? ?Transfers ?Overall transfer level: Needs assistance ?Equipment used: Rolling walker (2 wheels) ?Transfers: Sit to/from Stand ?Sit to Stand: Min guard ?  ?  ?  ?  ?  ?General transfer comment: for safety, lines/leads. RW utilized for pt self-steady ?  ? ?Ambulation/Gait ?Ambulation/Gait assistance: Min guard ?Gait Distance (Feet): 25 Feet ?Assistive device: Rolling walker (2 wheels) ?Gait Pattern/deviations: Step-through pattern, Decreased stride length, Antalgic ?Gait velocity: decr ?  ?  ?General Gait Details: cues for placement in RW, + antalgic gait secondary to R groin pain ? ?Stairs ?  ?  ?  ?  ?  ? ?Wheelchair Mobility ?  ? ?Modified Rankin (Stroke Patients Only) ?  ? ?  ? ?Balance Overall balance assessment: Needs assistance ?Sitting-balance support: No upper extremity supported, Feet supported ?Sitting balance-Leahy Scale: Good ?  ?  ?Standing balance support: Bilateral upper extremity supported, During functional activity ?Standing balance-Leahy Scale: Fair ?Standing balance comment: able to stand statically without UE support, RW utilized to offweight RLE ?  ?  ?  ?  ?  ?  ?  ?  ?  ?  ?  ?   ? ? ? ?Pertinent Vitals/Pain Pain Assessment ?Pain Assessment: Faces ?Faces Pain Scale: Hurts a little bit ?Pain Location: R groin ?Pain Descriptors / Indicators: Throbbing, Burning ?Pain Intervention(s): Limited activity within patient's tolerance, Monitored during session, Repositioned  ? ? ?Home Living Family/patient  expects to be discharged to:: Private residence ?Living Arrangements: Spouse/significant other ?Available Help at Discharge: Family;Available 24 hours/day ?Type of Home: House ?Home Access: Stairs to  enter ?Entrance Stairs-Rails: Right ?Entrance Stairs-Number of Steps: 3 ?  ?Home Layout: Laundry or work area in basement;Two level ?Home Equipment: Conservation officer, nature (2 wheels);Wheelchair - manual;Grab bars - tub/shower;Grab bars - toilet;Hand held shower head ?   ?  ?Prior Function Prior Level of Function : Independent/Modified Independent ?  ?  ?  ?  ?  ?  ?Mobility Comments: ambulates without AD, has been limited over the past couple of days due to R groin pain ?ADLs Comments: independent ADLs, limited IADLs, drive, spouse assists with meds ?  ? ? ?Hand Dominance  ? Dominant Hand: Right ? ?  ?Extremity/Trunk Assessment  ? Upper Extremity Assessment ?Upper Extremity Assessment: Defer to OT evaluation ?  ? ?Lower Extremity Assessment ?Lower Extremity Assessment: Generalized weakness ?RLE Deficits / Details: R "tightness" since procedure, + edema distal>proximal but per pt report improving. ?RLE: Unable to fully assess due to pain ?  ? ?Cervical / Trunk Assessment ?Cervical / Trunk Assessment: Normal  ?Communication  ? Communication: HOH  ?Cognition Arousal/Alertness: Awake/alert ?Behavior During Therapy: [facility] for tasks assessed/performed ?Overall Cognitive Status: Within Functional Limits for tasks assessed ?  ?  ?  ?  ?  ?  ?  ?  ?  ?  ?  ?  ?  ?  ?  ?  ?  ?  ?  ? ?  ?General Comments   ? ?  ?Exercises    ? ?Assessment/Plan  ?  ?PT Assessment Patient needs continued PT services  ?PT Problem List Decreased strength;Decreased mobility;Decreased range of motion;Decreased activity tolerance;Decreased balance;Decreased knowledge of use of DME;Pain;Decreased skin integrity ? ?   ?  ?PT Treatment Interventions DME instruction;Gait training;Stair training;Functional mobility training;Therapeutic activities;Therapeutic exercise;Balance training;Patient/family education   ? ?PT Goals (Current goals can be found in the Care Plan section)  ?Acute Rehab PT Goals ?Patient Stated Goal: home ?PT Goal Formulation: With patient ?Time  For Goal Achievement: 10/06/21 ?Potential to Achieve Goals: Good ? ?  ?Frequency Min 3X/week ?  ? ? ?Co-evaluation   ?  ?  ?  ?  ? ? ?  ?AM-PAC PT "6 Clicks" Mobility  ?Outcome Measure Help needed turning from your back to your side while in a flat bed without using bedrails?: None ?Help needed moving from lying on your back to sitting on the side of a flat bed without using bedrails?: None ?Help needed moving to and from a bed to a chair (including a wheelchair)?: A Little ?Help needed standing up from a chair using your arms (e.g., wheelchair or bedside chair)?: A Little ?Help needed to walk in hospital room?: A Little ?Help needed climbing 3-5 steps with a railing? : A Little ?6 Click Score: 20 ? ?  ?End of Session   ?Activity Tolerance: Patient tolerated treatment well ?Patient left: in chair;with call bell/phone within reach;Other (comment) (pt endorses he will press call button and wait for assist prior to mobilizing back to bed, RN notified) ?Nurse Communication: Mobility status ?PT Visit Diagnosis: Other abnormalities of gait and mobility (R26.89);Unsteadiness on feet (R26.81);Difficulty in walking, not elsewhere classified (R26.2);Pain ?Pain - Right/Left: Right ?Pain - part of body: Ankle and joints of foot ?  ? ?Time: 8338-2505 ?PT Time Calculation (min) (ACUTE ONLY): 14 min ? ? ?Charges:   PT Evaluation ?$PT Eval Low Complexity: 1 Low ?  ?  ?   ? ?  Stacie Glaze, PT DPT ?Acute Rehabilitation Services ?Pager [phone]  ?Office [phone] ? ? ?Raysean Graumann E Stroup ?09/22/2021, 1:50 PM ? ?

## 2021-09-22 NOTE — Progress Notes (Addendum)
? ?Subjective: NAEON ? ?Patient feeling well this AM. His pain is well controlled with current regimen; patient reports only taking Dilaudid and not Tylenol. Counseled on discontinuing Dilaudid and assessing Tylenol for adequate pain control in preparation for discharge; patient agreeable. He has not yet had a BM, but appetite is "fair." ? ?Objective: ? ?Vital signs in last 24 hours: ?Vitals:  ? 09/21/21 1450 09/21/21 2036 09/22/21 0229 09/22/21 0535  ?BP: (!) 141/80 (!) 160/86  (!) 175/93  ?Pulse: 65 75  72  ?Resp:    16  ?Temp:  97.8 ?F (36.6 ?C)  98.2 ?F (36.8 ?C)  ?TempSrc:  Oral    ?SpO2: 96% 100%  98%  ?Weight:   84.6 kg   ?Height:   5\' 8"  (1.727 m)   ? ?General: Chronically ill-appearing elderly male lying in bed in NAD ?HENT: normocephalic, atraumatic, external nares and ears appear normal ?EYES: conjunctiva non-erythematous, no scleral icterus ?CV: regular rate, abnormal rhythm, no murmurs, rubs, gallops.  RLE with 1-2+ edema from knee to foot; less RLE edema and TTP today.  ?Pulmonary: normal work of breathing on RA, lungs clear to auscultation bilaterally ?Abdominal: non-distended, soft, non-tender to palpation, normal BS all 4 quadrants ?Skin: Warm and dry.  Right groin with wound VAC in place, no drainage from surgical site. ?Neurological: Awake, alert and oriented x3. No focal deficits of CNII-XII. ?Psych: normal affect and behavior  ? ?Assessment/Plan: ?Albert Harris is a 66 year old male with past medical history of ESRD on HD, severe PAD/significant calcific disease of the common femoral artery s/p Viabahn stent, and recent admission in which endarterectomy of the common femoral artery was completed who presented for progressively worsening pain and swelling in the groin since discharge, now POD#1 lymphocele evacuation. ? ?Principal Problem: ?  Surgical wound infection ? ?#Surgical wound infection/Abscess of R groin ?Patient s/p endarterectomy of the right common femoral artery.  Now with abscess,  which vascular surgery was able to express purulent drainage.  WBC 13.8, ANC 11.0, but patient afebrile. Vascular surgery performed evacuation of lymphocele 4/16; appreciate assistance in patient's care. Surgical pathology culture with moderate S. Aureus. ?- Continue IV vancomycin x 2 weeks with HD. ?-- Will narrow antibiotics as sensitivities become available ?- Wound care with wound VAC; first dressing change Wednesday, 4/19. ?-- F/u TOC on home VAC approval. ?- CTM WBC on daily CBC ?- Continue to closely monitor vitals ?- Patient with renal diet and fluid restriction to 1.5 L. ?-- PT/OT evaluation  ?  ?#ESRD on HD TThS ?Patient has history of ESRD on HD TThSa.  Patient missed HD 4/15 due to pain, last session 4/13. He has AV fistula in his right arm. Nephrology following. ?-- Nephro plan for short HD course ahead of schedule today; will resume regular schedule tomorrow. ?--Avoid nephrotoxic medications ?--Dose medications renally ?  ?#HTN ?On admission, BP this a.m 175/93. Patient's home medications are amlodipine 10mg  daily and metoprolol 25mg  BID.  To be continued throughout admission. ?  ?#HFrEF ?LVEF estimated at 35-40% on TEE done 05/13/21. Patient does not report taking any home medications. ?--Keep Mg >2.0, K+ 4.0 ?  ?#COPD ?Patient has history of COPD and is a former smoker, with 100 pack-year history. He quit smoking in 2021. Patient has albuterol inhaler at home as needed, but reports that he does not use it. ?--PRN albuterol nebulizer  ? ?Prior to Admission Living Arrangement: Home ?Anticipated Discharge Location: Pending PT/OT ?Barriers to Discharge: Home wound VAC arrangement ?Dispo: Anticipated discharge in  approximately 2-3 day(s).  ? Rosezetta Schlatter, MD ?09/22/2021, 7:38 AM ?Pager: [phone] ?After 5pm on weekdays and 1pm on weekends: On Call pager [phone]  ?

## 2021-09-22 NOTE — TOC Initial Note (Addendum)
Transition of Care (TOC) - Initial/Assessment Note  ? ? ?Patient Details  ?Name: Albert Harris ?MRN: [id number] ?Date of Birth: September 06, 1955 ? ?Transition of Care (TOC) CM/SW Contact:    ?Marilu Favre, RN ?Phone Number: ?09/22/2021, 8:50 AM ? ?Clinical Narrative:                 ?Patient from home with wife. Confirmed face sheet information. Discussed home VAC. NCM has messaged Vascular and Attending for clarification on discharge date and signature for [facility] application. Left paper copy on chart, can also have 64M send provider e doc  ? ?Explained to patient once application signed ,NCM will fax to [place] , who will submit for insurance approval.  ? ? ?Once approved NCM will bring home VAC and dressing supplies to patient's room prior to discharge. Nurse will connect home VAC to dressing before discharge.  ? ?Patient will have [place] three times a week, Monday, Wednesday and FRiday to change VAC.  ? ?Patient voiced understanding. ? ?Cory with [facility] cannot accept referral for [facility] due to staffing.  ? ?Jenn with [facility] unable to accept  ? ?Orlando Penner at [facility] is not accepting [facility] Medicare at this time  ? ? ?Cheryl with [facility] unable to accept due to staffing  ? ?Marjory Lies with [facility], cannot accept due to staffing. ? ?Hoyle Sauer with [facility] cannot accept , not in network with insurance.  ? ?[facility] cannot accept  ? ?Erica with [facility] does not have staffing to accept referral.  ? ?Left Levada Dy with [facility] a message. ?Amy with [facility] asking if [facility] dressings can be done in vascular office this week and then they will "pick up" wound care next week. Secure chatted Dr Scot Dock, they do not do VAC changes in office. NCM notified My of same  ? ?Ramond Marrow with [facility] reviewing . Ramond Marrow returned call , they cannot accept referral.  ? ? ?Tillie Rung with [facility] 336 [phone] reviewing . Tillie Rung accepted ? ? ?VAC form faxed to 3 M  ? ? ? ?Patient aware . Secure chatted MD's  ? ?0865  home VAC at bedside  ? ?Expected Discharge Plan: [place] ?Barriers to Discharge: Continued Medical Work up ? ? ?Patient Goals and CMS Choice ?Patient states their goals for this hospitalization and ongoing recovery are:: to return to home ?CMS Medicare.gov Compare Post Acute Care list provided to:: Patient ?Choice offered to / list presented to : Patient ? ?Expected Discharge Plan and Services ?Expected Discharge Plan: [place] ?  ?Discharge Planning Services: CM Consult ?Post Acute Care Choice: Home Health ?Living arrangements for the past 2 months: [place] ?                ?DME Arranged: Vac ?DME Agency: [employer] ?Date DME Agency Contacted: 09/22/21 ?Time DME Agency Contacted: [phone] ?Representative spoke with at DME Agency: [place] ?HH Arranged: RN ?[place] Agency: [place] ?  ?Time [place]: [phone] ?Representative spoke with at [place]: Tommi Rumps ? ?Prior Living Arrangements/Services ?Living arrangements for the past 2 months: [place] ?Lives with:: Spouse ?Patient language and need for interpreter reviewed:: Yes ?Do you feel safe going back to the place where you live?: Yes      ?Need for Family Participation in Patient Care: Yes (Comment) ?Care giver support system in place?: Yes (comment) ?  ?Criminal Activity/Legal Involvement Pertinent to Current Situation/Hospitalization: No - Comment as needed ? ?  Activities of Daily Living ?Home Assistive Devices/Equipment: Blood pressure cuff ?ADL Screening (condition at time of admission) ?Patient's cognitive ability adequate to safely complete daily activities?: Yes ?Is the patient deaf or have difficulty hearing?: No ?Does the patient have difficulty seeing, even when wearing glasses/contacts?: No ?Does the patient have difficulty concentrating, remembering, or making decisions?: No ?Patient able to express need for assistance with ADLs?: Yes ?Does the patient have difficulty dressing or bathing?:  No ?Independently performs ADLs?: Yes (appropriate for developmental age) ?Does the patient have difficulty walking or climbing stairs?: No ?Weakness of Legs: Both ?Weakness of Arms/Hands: None ? ?Permission Sought/Granted ?  ?Permission granted to share information with : No ?   ?   ?   ?   ? ?Emotional Assessment ?Appearance:: Appears stated age ?Attitude/Demeanor/Rapport: Engaged ?Affect (typically observed): Accepting ?Orientation: : Oriented to Self, Oriented to Place, Oriented to  Time, Oriented to Situation ?Alcohol / Substance Use: Not Applicable ?Psych Involvement: No (comment) ? ?Admission diagnosis:  Surgical wound infection [T81.49XA] ?Abscess after procedure [T81.49XA] ?Patient Active Problem List  ? Diagnosis Date Noted  ? Surgical wound infection 09/20/2021  ? PAD (peripheral artery disease) ([place]) 09/05/2021  ? Critical limb ischemia of right lower extremity ([place])   ? Open wound of right great toe 08/28/2021  ? Bacteremia due to Gram-positive bacteria 05/09/2021  ? Acute metabolic encephalopathy 51/76/1607  ? Hyperkalemia 05/05/2021  ? Elevated LFTs 05/05/2021  ? Hypoglycemia 05/05/2021  ? Thrombocytopenia ([place]) 05/05/2021  ? Fall at home, initial encounter 05/05/2021  ? Goals of care, counseling/discussion   ? Abdominal swelling   ? Acute on chronic diastolic CHF (congestive heart failure) ([place]) 12/15/2020  ? SOB (shortness of breath) 12/15/2020  ? Hypertension   ? COPD (chronic obstructive pulmonary disease) ([place])   ? Volume overload 12/14/2020  ? ESRD (end stage renal disease) ([place])   ? Lower GI bleed   ? Pressure injury of skin 06/28/2020  ? Acute renal failure superimposed on stage 5 chronic kidney disease, not on chronic dialysis ([place])   ? Acute respiratory disease due to COVID-19 virus 06/16/2020  ? CKD (chronic kidney disease) stage 5, GFR less than 15 ml/min (HCC) 03/19/2020  ? Claudication in peripheral vascular disease ([place]) 09/12/2019  ? Carotid stenosis 09/12/2019  ? PFO (patent foramen  ovale) 08/10/2019  ? Noncompliance 08/10/2019  ? Hx of transient ischemic attack ([name]) 08/10/2019  ? Chronic diastolic heart failure ([place])   ? Elevated troponin   ? Hypertensive emergency   ? Acute respiratory failure with hypoxia ([place]) 07/18/2019  ? Acute pulmonary edema ([place]) 07/18/2019  ? ARF (acute renal failure) ([place]) 07/18/2019  ? Macrocytic anemia 07/18/2019  ? Right sided weakness 11/09/2017  ? CKD (chronic kidney disease), stage III ([place]) 11/09/2017  ? Tobacco abuse 01/19/2017  ? Hypertensive urgency 12/24/2016  ? [name] (transient ischemic attack) 12/24/2016  ? ?PCP:  Kerin Perna, NP ?Pharmacy:   ?[facility] [address] [employer], [address] ?[place] [address] ?Phone: [phone] Fax: [phone] ? ? ? ? ?Social Determinants of Health (SDOH) Interventions ?  ? ?Readmission Risk Interventions ? ?  05/07/2021  ?  2:27 PM 12/19/2020  ? 11:55 AM  ?Readmission Risk Prevention Plan  ?Transportation Screening Complete Complete  ?PCP or Specialist Appt within 3-5 Days  Complete  ?[place] or Home Care Consult  Complete  ?Social Work Consult for [place] Planning/Counseling  Complete  ?Palliative Care Screening  Complete  ?Medication Review (  RN Care Manager) Complete Complete  ?PCP or Specialist appointment within 3-5 days of discharge Complete   ?[place] or Home Care Consult Complete   ?SW Recovery Care/Counseling Consult Patient refused   ?Palliative Care Screening Not Applicable   ?[place] Not Applicable   ? ? ? ?

## 2021-09-22 NOTE — Progress Notes (Signed)
Will give vanc 750mg  IV x1 with HD today. ? ?Onnie Boer, PharmD, BCIDP, AAHIVP, CPP ?Infectious Disease Pharmacist ?09/22/2021 6:49 PM ? ? ?

## 2021-09-23 DIAGNOSIS — T8149XA Infection following a procedure, other surgical site, initial encounter: Secondary | ICD-10-CM | POA: Diagnosis not present

## 2021-09-23 LAB — BASIC METABOLIC PANEL
Anion gap: 15 (ref 5–15)
BUN: 35 mg/dL — ABNORMAL HIGH (ref 8–23)
CO2: 25 mmol/L (ref 22–32)
Calcium: 9.3 mg/dL (ref 8.9–10.3)
Chloride: 96 mmol/L — ABNORMAL LOW (ref 98–111)
Creatinine, Ser: 7.12 mg/dL — ABNORMAL HIGH (ref 0.61–1.24)
GFR, Estimated: 8 mL/min — ABNORMAL LOW (ref >60)
Glucose, Bld: 92 mg/dL (ref 70–99)
Potassium: 4.5 mmol/L (ref 3.5–5.1)
Sodium: 136 mmol/L (ref 135–145)

## 2021-09-23 LAB — CBC
HCT: 29.7 % — ABNORMAL LOW (ref 39.0–52.0)
Hemoglobin: 9.4 g/dL — ABNORMAL LOW (ref 13.0–17.0)
MCH: 33.5 pg (ref 26.0–34.0)
MCHC: 31.6 g/dL (ref 30.0–36.0)
MCV: 105.7 fL — ABNORMAL HIGH (ref 80.0–100.0)
Platelets: 200 10*3/uL (ref 150–400)
RBC: 2.81 MIL/uL — ABNORMAL LOW (ref 4.22–5.81)
RDW: 16.9 % — ABNORMAL HIGH (ref 11.5–15.5)
WBC: 12.2 10*3/uL — ABNORMAL HIGH (ref 4.0–10.5)
nRBC: 0 % (ref 0.0–0.2)

## 2021-09-23 MED ORDER — VANCOMYCIN HCL 750 MG/150ML IV SOLN
750.0000 mg | INTRAVENOUS | Status: DC
  Administered 2021-09-23: 750 mg via INTRAVENOUS
  Filled 2021-09-23 (×2): qty 150

## 2021-09-23 MED ORDER — VANCOMYCIN HCL 750 MG/150ML IV SOLN: 750.0000 mg | INTRAVENOUS | 0 refills | Status: AC

## 2021-09-23 NOTE — Consult Note (Signed)
[place] Nurse wound follow up ?Patient receiving care in [facility] 5N06. ?Wound type: right groin VAC surgical wound ?Measurement: 8 cm x 2.5 cm x 4 cm. Deepest point is at the superior aspect of the wound and AFTER I removed the one piece of white foam I found in the wound bed. The white foam piece was difficult to see, but easy to remove once found. ?Wound bed: 100% beefy red ?Drainage (amount, consistency, odor) sanguinous in cannister, approximately 400 ml. ?Periwound: intact ?Dressing procedure/placement/frequency: One easily seen piece of white foam used to fill the wound base, then one piece of black foam placed over the white foam. One barrier ring used to enhance seal in area closest to the scrotum. Drape applied, immediate seal obtained. Patient tolerated very well. Minimal bleeding when foam pieces removed from wound bed. ?Patient tells me he may go home today, but not certain. ? ?Val Riles, RN, MSN, CWOCN, CNS-BC, pager [phone]  ?

## 2021-09-23 NOTE — Discharge Instructions (Addendum)
Dear Albert Harris, ?I am so glad you are feeling better and can be discharged today (09/23/2021)! You were admitted for evacuation of a groin abscess (inguinal lymphocele).  ? ?Please see the following instructions: ?Please see your primary care / family doctor for your medical issues, concerns and or health care needs. ?Call to schedule an appointment with your primary care physician for a hospital follow-up visit within the next 1-2 weeks. ?Follow-up with vascular surgery, as scheduled: ?Dr. Scot Dock 4/20 at 9:20 AM at [facility], [address] [place], [place] [address]. ?NEW meds:  ?Vancomycin 750 mg with dialysis for 10 more sessions (Completes 5/10) ? ? ?Report any adverse effects and or reactions from the medicines to your outpatient provider promptly. Do not engage in alcohol and or illegal drug use while on prescription medicines. In the event of worsening symptoms call 911 and/or go to the nearest ED for appropriate evaluation and treatment of symptoms. ? ?It was a pleasure meeting you, Albert Harris.  I wish you and your family the best, and hope you stay happy and healthy! ? ?Rosezetta Schlatter, MD ?09/23/2021   ?

## 2021-09-23 NOTE — Progress Notes (Addendum)
[facility] ? ?VASCULAR SURGERY ASSESSMENT & PLAN:  ? ?POD 2 EVACUATION OF LYMPHOCELE RIGHT GROIN AND PLACEMENT OF VAC: His VAC has an excellent seal.  In looking at the canister on his VAC it looks like he is having moderate drainage.  I do not see the output recorded in the I's and O's.  He can go home with a VAC when approved. ?  ?ID: His culture grew moderate MRSA.  This is sensitive to Cipro.  He is on vancomycin IV.  We can adjust his antibiotics once his culture is final.  Fortunately he has a vein bypass with no prosthetic material in the groin. ?  ?DVT PROPHYLAXIS: He is on subcu heparin.  ?  ?Continue leg elevation as ordered. ? ?[name], MD ?10:04 AM ? ? ?Subjective  - No new complaints ? ? ?Objective ?(!) 164/86 ?61 ?97.6 ?F (36.4 ?C) (Oral) ?16 ?97% ? ?Intake/Output Summary (Last 24 hours) at 09/23/2021 0716 ?Last data filed at 09/22/2021 2053 ?Gross per 24 hour  ?Intake --  ?Output 3000 ml  ?Net -3000 ml  ? ? ?Right LE warm to touch with intact motor and sensation ?Dry gangrene third toe and GT wound surrounding nail, no sign of infection ?Right groin wound vac to suction 350 bloody drainage in canister ?Lungs non labored breathing ? ?Assessment/Planning: ?Albert Harris is a 67 y.o. male who underwent a previous iliofemoral endarterectomy and right femoral to below-knee popliteal artery bypass with vein he developed a lymphocele and swelling in the right leg. ? ?He returned to the OR with DR. Scot Dock for evacuation of Lymphocele and wound vac placement. ? ?Specimen Description WOUND   ?Special Requests SWAB FROM RIGHT GROIN SPEC A   ?Gram Stain FEW WBC PRESENT, PREDOMINANTLY PMN  ?FEW GRAM POSITIVE COCCI IN PAIRS  ?Performed at [facility], [place] [address], [place], [place] [address]   ?Culture MODERATE STAPHYLOCOCCUS AUREUS   ?Report Status PENDING   ? ?He is currently on Vancomycin IV TTS ?Pending wound vac supplies for home approval and antibiotic regimen  pending final culture growth.  ? ? ?[name] ?09/23/2021 ?7:16 AM ?-- ? ?Laboratory ?Lab Results: ?Recent Labs  ?  09/22/21 ?0158 09/23/21 ?0205  ?WBC 10.4 12.2*  ?HGB 9.7* 9.4*  ?HCT 31.9* 29.7*  ?PLT 200 200  ? ?BMET ?Recent Labs  ?  09/22/21 ?0158 09/23/21 ?0205  ?NA 136 136  ?K 5.7* 4.5  ?CL 97* 96*  ?CO2 24 25  ?GLUCOSE 123* 92  ?BUN 53* 35*  ?CREATININE 9.87* 7.12*  ?CALCIUM 9.5 9.3  ? ? ?COAG ?Lab Results  ?Component Value Date  ? INR 1.3 (H) 09/05/2021  ? INR 1.2 08/28/2021  ? INR 1.3 (H) 05/09/2021  ? ?No results found for: PTT ? ? ? ?

## 2021-09-23 NOTE — Progress Notes (Signed)
Pt. Verbalized understanding of discharge orders 

## 2021-09-23 NOTE — Evaluation (Signed)
Occupational Therapy Evaluation ?Patient Details ?Name: Albert Harris ?MRN: [id number] ?DOB: 11-Feb-1956 ?Today's Date: 09/23/2021 ? ? ?History of Present Illness 66 y.o. male presents to ED on 4/15 with pain, swelling of R groin incision from fempop procedure on 3/31. s/p evacuation of lymphocele R groin and placement of VAC on 4/16. PMH includes s/p 3/31 R CFA endarterectomy and femoral to popliteal bypass with vein, recent history of DVT, R great toe wound, ESRD on HD TTS, severe PAD, hypertension, COPD with tobacco abuse, hyperlipidemia and HFrEF.  ? ?Clinical Impression ?  ?Pt reports independence at baseline with ADLs and functional mobility, lives with spouse who can provide assistance at d/c. Pt currently needing set up -min A for most ADLs, mod A for LB ADLs due to pain/decreased ROM. Pt mod I for bed mobility and min guard for transfers with RW. Pt presenting with impairments listed below, will follow acutely. Recommend HHOT at d/c.  ?   ? ?Recommendations for follow up therapy are one component of a multi-disciplinary discharge planning process, led by the attending physician.  Recommendations may be updated based on patient status, additional functional criteria and insurance authorization.  ? ?Follow Up Recommendations ? Home health OT  ?  ?Assistance Recommended at Discharge Set up Supervision/Assistance  ?Patient can return home with the following A little help with bathing/dressing/bathroom;Assistance with cooking/housework;Help with stairs or ramp for entrance;Assist for transportation ? ?  ?Functional Status Assessment ? Patient has had a recent decline in their functional status and demonstrates the ability to make significant improvements in function in a reasonable and predictable amount of time.  ?Equipment Recommendations ? None recommended by OT  ?  ?Recommendations for Other Services   ? ? ?  ?Precautions / Restrictions Precautions ?Precautions: Fall ?Precaution Comments: hx of  falls ?Restrictions ?Weight Bearing Restrictions: No  ? ?  ? ?Mobility Bed Mobility ?Overal bed mobility: Modified Independent ?  ?  ?  ?  ?  ?  ?General bed mobility comments: increased time with use of bedrail ?  ? ?Transfers ?Overall transfer level: Needs assistance ?Equipment used: Rolling walker (2 wheels) ?Transfers: Sit to/from Stand ?Sit to Stand: Min guard ?  ?  ?  ?  ?  ?  ?  ? ?  ?Balance Overall balance assessment: Needs assistance ?Sitting-balance support: No upper extremity supported, Feet supported ?Sitting balance-Leahy Scale: Good ?  ?  ?Standing balance support: Bilateral upper extremity supported, During functional activity ?Standing balance-Leahy Scale: Fair ?Standing balance comment: can stand staticall for toileting without RW support ?  ?  ?  ?  ?  ?  ?  ?  ?  ?  ?  ?   ? ?ADL either performed or assessed with clinical judgement  ? ?ADL Overall ADL's : Needs assistance/impaired ?Eating/Feeding: Set up;Sitting ?  ?Grooming: Set up;Standing ?  ?Upper Body Bathing: Set up;Sitting ?  ?Lower Body Bathing: Minimal assistance;Cueing for safety;Sitting/lateral leans;Sit to/from stand ?  ?Upper Body Dressing : Set up;Sitting ?  ?Lower Body Dressing: Moderate assistance;Sitting/lateral leans ?Lower Body Dressing Details (indicate cue type and reason): to don socks ?Toilet Transfer: Min guard;Rolling walker (2 wheels);Ambulation;Regular Toilet ?  ?Toileting- Clothing Manipulation and Hygiene: Supervision/safety;Sitting/lateral lean;Sit to/from stand ?  ?  ?  ?Functional mobility during ADLs: Min guard;Rolling walker (2 wheels) ?   ? ? ? ?Vision   ?Vision Assessment?: No apparent visual deficits  ?   ?Perception   ?  ?Praxis   ?  ? ?Pertinent Vitals/Pain  Pain Assessment ?Pain Assessment: No/denies pain  ? ? ? ?Hand Dominance   ?  ?Extremity/Trunk Assessment Upper Extremity Assessment ?Upper Extremity Assessment: Overall WFL for tasks assessed ?  ?Lower Extremity Assessment ?Lower Extremity Assessment:  Defer to PT evaluation ?  ?Cervical / Trunk Assessment ?Cervical / Trunk Assessment: Normal ?  ?Communication   ?  ?Cognition Arousal/Alertness: Awake/alert ?Behavior During Therapy: [facility] for tasks assessed/performed ?Overall Cognitive Status: Within Functional Limits for tasks assessed ?  ?  ?  ?  ?  ?  ?  ?  ?  ?  ?  ?  ?  ?  ?  ?  ?  ?  ?  ?General Comments  VSS on RA ? ?  ?Exercises   ?  ?Shoulder Instructions    ? ? ?Home Living Family/patient expects to be discharged to:: Private residence ?Living Arrangements: Spouse/significant other ?Available Help at Discharge: Family;Available 24 hours/day ?Type of Home: House ?Home Access: Stairs to enter ?Entrance Stairs-Number of Steps: 3 ?Entrance Stairs-Rails: Right ?Home Layout: Laundry or work area in basement;Two level;Able to live on main level with bedroom/bathroom ?  ?  ?Bathroom Shower/Tub: Tub/shower unit ?  ?Bathroom Toilet: Standard ?Bathroom Accessibility: Yes ?How Accessible: Accessible via walker ?Home Equipment: Conservation officer, nature (2 wheels);Wheelchair - manual;Grab bars - tub/shower;Grab bars - toilet;Hand held shower head ?  ?  ?  ? ?  ?Prior Functioning/Environment Prior Level of Function : Independent/Modified Independent ?  ?  ?  ?  ?  ?  ?Mobility Comments: ambulates without AD, has been limited over the past couple of days due to R groin pain ?ADLs Comments: independent ADLs, limited IADLs, drive, spouse assists with meds ?  ? ?  ?  ?OT Problem List: Decreased activity tolerance;Pain;Impaired balance (sitting and/or standing);Decreased range of motion ?  ?   ?OT Treatment/Interventions: Self-care/ADL training;Patient/family education;Balance training;Therapeutic activities  ?  ?OT Goals(Current goals can be found in the care plan section) Acute Rehab OT Goals ?Patient Stated Goal: to go home ?OT Goal Formulation: With patient ?Time For Goal Achievement: 09/20/21 ?Potential to Achieve Goals: Good ?ADL Goals ?Pt Will Perform Grooming: with modified  independence;sitting;standing ?Pt Will Perform Upper Body Dressing: with modified independence;sitting ?Pt Will Perform Lower Body Dressing: sit to/from stand;sitting/lateral leans;with supervision ?Pt Will Transfer to Toilet: with modified independence;ambulating;regular height toilet ?Pt Will Perform Tub/Shower Transfer: Shower transfer;Tub transfer;ambulating;rolling walker  ?OT Frequency: Min 2X/week ?  ? ?Co-evaluation   ?  ?  ?  ?  ? ?  ?AM-PAC OT "6 Clicks" Daily Activity     ?Outcome Measure Help from another person eating meals?: None ?Help from another person taking care of personal grooming?: None ?Help from another person toileting, which includes using toliet, bedpan, or urinal?: A Little ?Help from another person bathing (including washing, rinsing, drying)?: A Little ?Help from another person to put on and taking off regular upper body clothing?: A Little ?Help from another person to put on and taking off regular lower body clothing?: A Little ?6 Click Score: 20 ?  ?End of Session Equipment Utilized During Treatment: Gait belt;Rolling walker (2 wheels) ?Nurse Communication: Mobility status ? ?Activity Tolerance: Patient tolerated treatment well ?Patient left: in chair;with call bell/phone within reach;with chair alarm set ? ?OT Visit Diagnosis: Unsteadiness on feet (R26.81);Other abnormalities of gait and mobility (R26.89);Muscle weakness (generalized) (M62.81)  ?              ?Time: 8563-1497 ?OT Time Calculation (min): 22 min ?Charges:  OT General Charges ?$OT Visit: 1 Visit ?OT Evaluation ?$OT Eval Moderate Complexity: 1 Mod ? ?Lynnda Child, OTD, OTR/L ?Acute Rehab ?[phone] ? ? ?Kaylyn Lim ?09/23/2021, 3:22 PM ?

## 2021-09-23 NOTE — Progress Notes (Signed)
D/C order noted. Contacted [facility] to advise clinic of pt's d/c today and that pt should resume care on Thursday.  ? ?Melven Sartorius ?Renal Navigator ?[phone] ?

## 2021-09-23 NOTE — Progress Notes (Signed)
Pt. Returned from HD no complaint VSSS ?

## 2021-09-23 NOTE — Plan of Care (Signed)

## 2021-09-23 NOTE — Progress Notes (Signed)
?  [facility] ?Progress Note  ? ?Subjective:  Seen in room. No c/o's.  ? ?Objective ?Vitals:  ? 09/22/21 2043 09/22/21 2053 09/22/21 2222 09/23/21 0428  ?BP: 112/69 131/66 (!) 127/58 (!) 164/86  ?Pulse: 65 67 68 61  ?Resp:  18 16 16   ?Temp:  98 ?F (36.7 ?C) (!) 97.5 ?F (36.4 ?C) 97.6 ?F (36.4 ?C)  ?TempSrc:  Oral Oral Oral  ?SpO2:  97% 96% 97%  ?Weight:  77.3 kg    ?Height:      ? ?Physical Exam ?General: Well appearing man, NAD. Room air. ?Heart: RRR; no murmur ?Lungs: CTA anteriorly ?Abdomen: soft ?Extremities: No LE edema; RLE with multiple dry incisions going up and wound vac in R groin area ?Dialysis Access: R forearm loop AVG + bruit ? ? ?Dialysis Orders: ?TTS at AF ?4hr, 350/800, EDW 71.5kg, 2K/2Ca, R forearm AVG, no heparin ?- Mircera 176mcg IV q 2 weeks (last 4/13) ?- Hectoral 32mcg IV q HD ? ?Assessment/Plan: ?1. R groin abscess v. lymphocele: In location of recent RLE revascularization 3/31. VVS consulted, underwent drainage with wound vac placement 4/16. Wound Cx + Staph - on Vanc. ?2. ESRD: on HD TTS. He missed HD on 4/15. K+ was up here so he had HD off schedule yesterday. Plan HD again today on schedule. OK for dc after HD today, have d/w pmd.  ?3. HTN/volume: BP high with some orthopnea. 3 L off yest, BP's remain up. 6kg up today, plan large UF 3-4 L today. No resp issues.  ?4. Anemia of ESRD: Hgb 9.7 - not due for ESA yet. ?5. Secondary hyperparathyroidism:  CorrCa high - will hold VDRA. Phos ok - may need to consider changing to alternative (non-Ca) binder. ?6. Nutrition: Alb low, adding supplement. ?7. HFrEF (EF 35-40%) ?8. PAD (see #1):s/p recent RLE revascularization ? ?Kelly Splinter, MD ?09/23/2021, 7:45 AM ? ? ? ? ? ?Recent Labs  ?Lab 09/20/21 ?1351 09/20/21 ?2101 09/21/21 ?0139 09/22/21 ?0158 09/23/21 ?0205  ?HGB 10.0*  --    < > 9.7* 9.4*  ?ALBUMIN 3.2*  --   --   --   --   ?CALCIUM 10.4*  --    < > 9.5 9.3  ?PHOS  --  5.2*  --   --   --   ?CREATININE 7.33*  --    < > 9.87* 7.12*   ?K 3.8  --    < > 5.7* 4.5  ? < > = values in this interval not displayed.  ? ? ?Medications: ? [START ON 09/25/2021] vancomycin    ? ? acetaminophen  1,000 mg Oral Q8H  ? amLODipine  10 mg Oral Daily  ? aspirin EC  81 mg Oral Daily  ? atorvastatin  80 mg Oral Daily  ? calcium acetate  1,334 mg Oral TID WC  ? Chlorhexidine Gluconate Cloth  6 each Topical Q0600  ? heparin  5,000 Units Subcutaneous Q8H  ? metoprolol tartrate  25 mg Oral BID  ? mupirocin ointment  1 application. Nasal BID  ? polyethylene glycol  17 g Oral Daily  ? ? ? ? ? ? ? ? ?

## 2021-09-23 NOTE — Discharge Summary (Signed)
? ?Name: Albert Harris ?MRN: [id number] ?DOB: 12/31/1955 66 y.o. ?PCP: Kerin Perna, NP ? ?Date of Admission: 09/20/2021  1:23 PM ?Date of Discharge:   09/23/2021 1:53 PM ?Attending Physician: Campbell Riches, MD ? ?Discharge Diagnosis: ?Principal Problem: ?  Surgical wound infection ? ? ?Discharge Medications: ?Allergies as of 09/23/2021   ? ?   Reactions  ? Lidocaine-prilocaine Rash  ? Caused red marks up and down arm  ? ?  ? ?  ?Medication List  ?  ? ?STOP taking these medications   ? ?amoxicillin-clavulanate 500-125 MG tablet ?Commonly known as: Augmentin ?  ?hydrOXYzine 10 MG tablet ?Commonly known as: ATARAX ?  ?oxyCODONE 5 MG immediate release tablet ?Commonly known as: Roxicodone ?  ?oxyCODONE-acetaminophen 5-325 MG tablet ?Commonly known as: PERCOCET/ROXICET ?  ? ?  ? ?TAKE these medications   ? ?acetaminophen 500 MG tablet ?Commonly known as: TYLENOL ?Take 1,000 mg by mouth daily as needed for mild pain. ?  ?albuterol 108 (90 Base) MCG/ACT inhaler ?Commonly known as: VENTOLIN HFA ?INHALE 1-2 PUFFS INTO THE LUNGS EVERY 6 (SIX) HOURS AS NEEDED FOR WHEEZING OR SHORTNESS OF BREATH. ?What changed: how much to take ?  ?amLODipine 10 MG tablet ?Commonly known as: NORVASC ?Take 1 tablet (10 mg total) by mouth daily. ?  ?aspirin 81 MG EC tablet ?Take 1 tablet (81 mg total) by mouth daily. Swallow whole. ?What changed: Another medication with the same name was removed. Continue taking this medication, and follow the directions you see here. ?  ?atorvastatin 40 MG tablet ?Commonly known as: LIPITOR ?Take 2 tablets (80 mg total) by mouth daily. ?  ?calcium acetate 667 MG capsule ?Commonly known as: PHOSLO ?Take 2 capsule by mouth three times a day with meals ?  ?metoprolol tartrate 25 MG tablet ?Commonly known as: LOPRESSOR ?TAKE 1 TABLET BY MOUTH TWICE A DAY ?  ?vancomycin 750 MG/150ML Soln ?Commonly known as: VANCOREADY ?Inject 150 mLs (750 mg total) into the vein Every Tuesday,Thursday,and Saturday with  dialysis for 10 doses. ?  ? ?  ? ?  ?  ? ? ?  ?Discharge Care Instructions  ?(From admission, onward)  ?  ? ? ?  ? ?  Start     Ordered  ? 09/23/21 0000  Discharge wound care:       ?Comments: Wound VAC should be changed MWF by the [facility] wound care team.  ? 09/23/21 1420  ? ?  ?  ? ?  ? ? ?Disposition and follow-up:   ?Albert Harris was discharged from [facility] in Stable condition.  At the hospital follow up visit please address: ? ?1.  Wound VAC- Follow up with Dr. Scot Dock 4/20 at 9:20 AM ? ?2.  Labs / imaging needed at time of follow-up: N/A ? ?3.  Pending labs/ test needing follow-up: Blood cultures ? ?Follow-up Appointments: ? Follow-up Information   ? ? [place]. Follow up.   ?Specialty: Home Health Services ?Contact information: ?[address] ?[place] [address] ?[phone] ? ? ?  ?  ? ?  ?  ? ?  ? ? ?Hospital Course by problem list: ?Albert Harris is a 66 year old male with past medical history of ESRD on HD, severe PAD/significant calcific disease of the common femoral artery s/p Viabahn stent, and recent admission in which endarterectomy of the common femoral artery was completed who presented for progressively worsening pain and swelling in the groin since discharge, s/p lymphocele evacuation. ? ?#  Surgical wound infection/Abscess of R groin ?Patient s/p endarterectomy of the right common femoral artery.  Presented this admission with abscess, WBC 13.8, ANC 11.0, but patient remained afebrile. Vascular surgery performed evacuation of lymphocele 4/16. Surgical pathology culture with MRSA. Upon discharge, continue IV vancomycin 750 mg x 2 weeks with HD (total of 14 doses, EOT 5/10). [place] wound care with wound VAC via [place] [facility] [place] referral placed. ?  ?#ESRD on HD TThS ?Patient has history of ESRD on HD TThSa.  Patient missed HD 4/15 due to pain, but received short course of HD 4/17, with regular schedule resumed prior to discharge.  ?   ?#HTN ?Patient's home medications, amlodipine 10mg  daily and metoprolol 25mg  BID, continued throughout admission. ?  ?#HFrEF ?LVEF estimated at 35-40% on TEE done 05/13/21. Patient does not report taking any home medications. Patient edematous but due to bypass rather than HFrEF exacerbation. No interventions this admission. ?  ?#COPD ?Patient has history of COPD and is a former smoker, with 100 pack-year history. He quit smoking in 2021. Continued albuterol inhaler PRN throughout admission. ? ?Discharge Subjective: ?Patient reports pain is well controlled and he is tolerating the wound VAC well. He feels ready for discharge today. He was informed that we would dialyze today prior to discharge to restore his schedule, and he was agreeable. Patient voiced no acute concerns or complaints today.  ? ?Discharge Exam:   ?BP (!) 111/57   Pulse (!) 57   Temp 98.3 ?F (36.8 ?C) (Temporal)   Resp 15   Ht 5\' 8"  (1.727 m)   Wt 77.5 kg   SpO2 96%   BMI 25.98 kg/m?  ?Discharge exam:  ?General: Chronically ill-appearing elderly male lying comfortably in bed ?HENT: normocephalic, atraumatic, external nares and ears appear normal ?EYES: conjunctiva non-erythematous, no scleral icterus ?CV: regular rate, abnormal rhythm, no murmurs, rubs, gallops.  RLE with 1-2+ edema from knee to foot; less RLE edema and TTP.  ?Pulmonary: normal work of breathing on RA, lungs clear to auscultation bilaterally ?Abdominal: non-distended, soft, non-tender to palpation, normal BS all 4 quadrants ?Skin: Warm and dry.  Right groin with wound VAC in place, clean, dry. No drainage apparent. ?Neurological: Awake, alert and oriented x3. No focal deficits of CNII-XII. ?Psych: normal affect and behavior  ? ?Pertinent Labs, Studies, and Procedures:  ? ?CT ABDOMEN PELVIS WO CONTRAST ? ?Result Date: 09/20/2021 ?CLINICAL DATA:  Right groin abscess. Recent right femoral to popliteal artery bypass surgery 2 weeks ago. EXAM: CT ABDOMEN AND PELVIS WITHOUT CONTRAST  TECHNIQUE: Multidetector CT imaging of the abdomen and pelvis was performed following the standard protocol without IV contrast. RADIATION DOSE REDUCTION: This exam was performed according to the departmental dose-optimization program which includes automated exposure control, adjustment of the mA and/or kV according to patient size and/or use of iterative reconstruction technique. COMPARISON:  CT pelvis dated September 03, 2021. FINDINGS: Lower chest: No acute abnormality.  Unchanged cardiomegaly. Hepatobiliary: No focal liver abnormality is seen. No gallstones, gallbladder wall thickening, or biliary dilatation. Pancreas: Unremarkable. No pancreatic ductal dilatation or surrounding inflammatory changes. Unchanged splenule in the pancreatic tail. Spleen: Normal in size without focal abnormality. Adrenals/Urinary Tract: Adrenal glands are unremarkable. Unchanged bilateral renal atrophy and renovascular calcification. No renal calculi or hydronephrosis. The bladder is decompressed. Stomach/Bowel: Stomach is within normal limits. Appendix appears normal. No evidence of bowel wall thickening, distention, or inflammatory changes. Left-sided colonic diverticulosis again noted. Vascular/Lymphatic: Aortic atherosclerosis. Left peri-aortic soft tissue thickening at  the level of left renal artery origin with surrounding fat stranding persists but has decreased compared to prior study. Bilateral common iliac and right external iliac artery stents. Postsurgical changes in the right groin with new slightly ill-defined 5.0 x 3.7 cm fluid collection (series 3, image 75). Multiple new mildly enlarged right external iliac and inguinal lymph nodes, likely reactive. Reproductive: Prostate is unremarkable. Other: No free fluid or pneumoperitoneum. Musculoskeletal: New soft tissue swelling in the anterior right upper thigh. No acute or significant osseous findings. IMPRESSION: 1. Postsurgical changes in the right groin with new slightly  ill-defined 5.0 x 3.7 cm fluid collection, presumably an abscess given clinical history. Differential considerations also include hematoma and pseudoaneurysm. Consider further evaluation with ultrasound. 2. Decreased le

## 2021-09-24 ENCOUNTER — Telehealth: Payer: Self-pay

## 2021-09-24 ENCOUNTER — Other Ambulatory Visit: Payer: Self-pay | Admitting: Student

## 2021-09-24 ENCOUNTER — Telehealth (HOSPITAL_COMMUNITY): Payer: Self-pay | Admitting: Nephrology

## 2021-09-24 NOTE — Telephone Encounter (Signed)
Transition Care Management Follow-up Telephone Call ?Date of discharge and from where: 09/23/2021,  [facility]   ?How have you been since you were released from the hospital? He stated that he is feeling pretty good.  ?Any questions or concerns? No ? ?Items Reviewed: ?Did the pt receive and understand the discharge instructions provided? Yes  ?Medications obtained and verified? Yes - he said he has all of his medications and did not have any questions at this time about the med regime. Instructed him to review the med list again and call this clinic back if he has any questions.  ?Other? No  ?Any new allergies since your discharge? No  ?Dietary orders reviewed? Yes - he said he is trying his best to adhere to prescribed diet.  ?Do you have support at home? Yes , his wife ? ?Home Care and Equipment/Supplies: ?Were home health services ordered? yes ?If so, what is the name of the agency? [facility]  ?Has the agency set up a time to come to the patient's home? Yes - today at 1400. ?Were any new equipment or medical supplies ordered?  Yes: wound VAC ?What is the name of the medical supply agency? He received it in the hospital  ?Were you able to get the supplies/equipment? yes ?Do you have any questions related to the use of the equipment or supplies? No ? ?He attends HD: T/T/S at [facility]  ? ?Functional Questionnaire: (I = Independent and D = Dependent) ?ADLs: independent - has walker to use with ambulation if needed.  ? ?Follow up appointments reviewed: ? ?PCP Hospital f/u appt confirmed?  He said he will call to schedule an appointment   ?[facility] f/u appt confirmed? Yes  Scheduled to see [facility]VS- 09/25/2021. ?Are transportation arrangements needed? No  ?If their condition worsens, is the pt aware to call PCP or go to the Emergency Dept.? Yes ?Was the patient provided with contact information for the PCP's office or ED? Yes ?Was to pt encouraged to call back with questions or  concerns? Yes ? ?

## 2021-09-24 NOTE — Telephone Encounter (Signed)
Transition of care contact from inpatient facility ? ?Date of discharge: 09/23/21 ?Date of contact: 09/24/21 ?Method: Phone ?Spoke to: Patient ? ?Patient contacted to discuss transition of care from recent inpatient hospitalization. Patient was admitted to [facility] from 4/15 - 09/23/2021 with discharge diagnosis of R groin abscess/infected lymphocele. ? ?Medication changes were reviewed. Will be on 6 week course of Vancomycin q HD. Reminded of importance of NOT missing any HD because that will result in missed abx dose. ? ?Patient will follow up with his/her outpatient HD unit on: tomorrow (TTS). He was a little concerned about HD tomorrow b/c HH coming to change wound vac at 9:40a and HD sched is 10:50am. Called his unit to inform them that he will likely be late tomorrow. Spoke to patient again and informed him that clinic will run him whenever he gets there. ? ?No add'l needs at this time. ? ?Veneta Penton, PA-C ?[facility] ?Pager [phone] ? ?  ?

## 2021-09-25 ENCOUNTER — Encounter: Payer: Medicare Other | Admitting: Vascular Surgery

## 2021-09-26 LAB — AEROBIC/ANAEROBIC CULTURE W GRAM STAIN (SURGICAL/DEEP WOUND)

## 2021-09-27 LAB — CULTURE, BLOOD (ROUTINE X 2)
Culture: NO GROWTH
Culture: NO GROWTH
Special Requests: ADEQUATE

## 2021-10-02 ENCOUNTER — Other Ambulatory Visit (INDEPENDENT_AMBULATORY_CARE_PROVIDER_SITE_OTHER): Payer: Self-pay | Admitting: Primary Care

## 2021-10-02 NOTE — Telephone Encounter (Signed)
Medication Refill - Medication: amLODipine (NORVASC) 10 MG tablet ? ?Pt wife stated pt needs bp medication. Pt wife is aware that PCP may not be prescriber, but she asked if PCP will fill.  ?  ?Has the patient contacted their pharmacy? Yes.   Pharmacy has sent refill request.  ? ?(Agent: If yes, when and what did the pharmacy advise?) ? ?Preferred Pharmacy (with phone number or street name):  ?[facility]  ?[place] [address]  ?Phone: [phone] Fax: [phone]  ?Hours: Not open 24 hours  ? ?Has the patient been seen for an appointment in the last year OR does the patient have an upcoming appointment? Yes.   ? ?Agent: Please be advised that RX refills may take up to 3 business days. We ask that you follow-up with your pharmacy.  ?

## 2021-10-03 ENCOUNTER — Other Ambulatory Visit (INDEPENDENT_AMBULATORY_CARE_PROVIDER_SITE_OTHER): Payer: Self-pay | Admitting: Primary Care

## 2021-10-03 MED ORDER — AMLODIPINE BESYLATE 10 MG PO TABS: 10.0000 mg | ORAL_TABLET | Freq: Every day | ORAL | 0 refills | Status: DC

## 2021-10-03 NOTE — Telephone Encounter (Signed)
Requested medication (s) are due for refill today: yes ? ?Requested medication (s) are on the active medication list: yes ? ?Last refill:  08/30/21 ? ?Future visit scheduled: yes ? ?Notes to clinic:  Unable to refill per protocol, last refill by another provider. Pt request PCP refill medication. ? ? ?  ?Requested Prescriptions  ?Pending Prescriptions Disp Refills  ? amLODipine (NORVASC) 10 MG tablet 30 tablet 0  ?  Sig: Take 1 tablet (10 mg total) by mouth daily.  ?  ? Cardiovascular: Calcium Channel Blockers 2 Passed - 10/02/2021  9:57 AM  ?  ?  Passed - Last BP in normal range  ?  BP Readings from Last 1 Encounters:  ?09/23/21 124/66  ?  ?  ?  ?  Passed - Last Heart Rate in normal range  ?  Pulse Readings from Last 1 Encounters:  ?09/23/21 64  ?  ?  ?  ?  Passed - Valid encounter within last 6 months  ?  Recent Outpatient Visits   ? ?      ? 3 months ago Screening for colon cancer  ? [facility] Kerin Perna, NP  ? 10 months ago ESRD (end stage renal disease) ([place])  ? [facility] Kerin Perna, NP  ? 1 year ago Impacted cerumen of left ear  ? [facility] Kerin Perna, NP  ? 1 year ago Hospital discharge follow-up  ? [facility] Kerin Perna, NP  ? 2 years ago Screening for colon cancer  ? [facility] Kerin Perna, NP  ? ?  ?  ?Future Appointments   ? ?        ? In 2 months Oletta Lamas, Milford Cage, NP [place]  ? ?  ? ? ?  ?  ?  ? ? ?

## 2021-10-06 ENCOUNTER — Other Ambulatory Visit: Payer: Self-pay

## 2021-10-16 ENCOUNTER — Ambulatory Visit (INDEPENDENT_AMBULATORY_CARE_PROVIDER_SITE_OTHER): Payer: Medicare Other | Admitting: Vascular Surgery

## 2021-10-16 ENCOUNTER — Encounter: Payer: Self-pay | Admitting: Vascular Surgery

## 2021-10-16 VITALS — BP 172/108 | HR 77 | Temp 98.0°F | Resp 20 | Ht 68.0 in | Wt 170.0 lb

## 2021-10-16 DIAGNOSIS — I739 Peripheral vascular disease, unspecified: Secondary | ICD-10-CM

## 2021-10-16 DIAGNOSIS — Z48812 Encounter for surgical aftercare following surgery on the circulatory system: Secondary | ICD-10-CM

## 2021-10-16 NOTE — Progress Notes (Signed)
? ?Patient name: Albert Harris MRN: [id number] DOB: 1955-06-17 Sex: male ? ?REASON FOR VISIT:  ? ?Follow-up after evacuation of lymphocele right groin ? ?HPI:  ? ?Albert Harris is a pleasant 67 y.o. male who underwent a previous iliofemoral endarterectomy on the right and a right femoral to below-knee popliteal artery bypass with a vein graft.  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  On 09/21/2021 he underwent evacuation of the lymphocele in the right groin and placement of a VAC.  He comes in for his first outpatient visit.  Of note at the time of surgery no clear source of lymphatic drainage was identified.  The graft was covered with tissue and had a good pulse.  Initially the wound measured 3 cm in depth by 10 cm in length by 2 cm in width. ? ?The patient had been getting the VAC changed 3 times a week.  Yesterday after going to the store this came off.  He comes in today for routine follow-up visit.  He has no specific complaints.  He has been trying to elevate his legs some. ? ?Current Outpatient Medications  ?Medication Sig Dispense Refill  ? amLODipine (NORVASC) 10 MG tablet Take 1 tablet (10 mg total) by mouth daily. 30 tablet 0  ? metoprolol tartrate (LOPRESSOR) 25 MG tablet TAKE 1 TABLET BY MOUTH TWICE A DAY 60 tablet 6  ? acetaminophen (TYLENOL) 500 MG tablet Take 1,000 mg by mouth daily as needed for mild pain. (Patient not taking: Reported on 09/20/2021)    ? albuterol (VENTOLIN HFA) 108 (90 Base) MCG/ACT inhaler INHALE 1-2 PUFFS INTO THE LUNGS EVERY 6 (SIX) HOURS AS NEEDED FOR WHEEZING OR SHORTNESS OF BREATH. (Patient taking differently: Inhale 2 puffs into the lungs every 6 (six) hours as needed for wheezing or shortness of breath.) 8 g 0  ? aspirin EC 81 MG EC tablet Take 1 tablet (81 mg total) by mouth daily. Swallow whole. (Patient not taking: Reported on 09/03/2021) 30 tablet 5  ? atorvastatin (LIPITOR) 40 MG tablet Take 2 tablets (80 mg total) by mouth daily.  (Patient not taking: Reported on 09/20/2021) 60 tablet 5  ? calcium acetate (PHOSLO) 667 MG capsule Take 2 capsule by mouth three times a day with meals (Patient not taking: Reported on 09/20/2021) 540 capsule 3  ? ?No current facility-administered medications for this visit.  ? ? ?REVIEW OF SYSTEMS:  ?[X]  denotes positive finding, [ ]  denotes negative finding ?Vascular    ?Leg swelling    ?Cardiac    ?Chest pain or chest pressure:    ?Shortness of breath upon exertion:    ?Short of breath when lying flat:    ?Irregular heart rhythm:    ?Constitutional    ?Fever or chills:    ? ?PHYSICAL EXAM:  ? ?Vitals:  ? 10/16/21 0923  ?BP: (!) 172/108  ?Pulse: 77  ?Resp: 20  ?Temp: 98 ?F (36.7 ?C)  ?SpO2: 95%  ?Weight: 170 lb (77.1 kg)  ?Height: 5\' 8"  (1.727 m)  ? ? ?GENERAL: The patient is a well-nourished male, in no acute distress. The vital signs are documented above. ?CARDIOVASCULAR: There is a regular rate and rhythm. ?PULMONARY: There is good air exchange bilaterally without wheezing or rales. ?His incision at this point is almost completely healed.  I was having Internet issues in the office who could not get a picture of the wound but basically this just needs a small dressing.  He can ?  He continues to have moderate right lower extremity swelling.  He has a palpable popliteal pulse with brisk Doppler signals in the posterior tibial and dorsalis pedis positions. ? ? ?DATA:  ? ?No new data ? ?MEDICAL ISSUES:  ? ?S/P ILIOFEMORAL ENDARTERECTOMY WITH RIGHT FEMORAL TO BELOW-KNEE POPLITEAL ARTERY BYPASS WITH VEIN: His bypass graft is patent.  The wound of the right groin has almost completely healed.  I will plan on seeing him back in 6 months with ABIs and a graft duplex.  We have again discussed the importance of leg elevation.  He knows to call sooner if he has problems. ? ? ?[name] ?[facility] ?[phone] ?

## 2021-10-29 ENCOUNTER — Other Ambulatory Visit: Payer: Self-pay | Admitting: *Deleted

## 2021-10-29 DIAGNOSIS — I70235 Atherosclerosis of native arteries of right leg with ulceration of other part of foot: Secondary | ICD-10-CM

## 2021-10-29 DIAGNOSIS — I739 Peripheral vascular disease, unspecified: Secondary | ICD-10-CM

## 2021-10-29 DIAGNOSIS — Z48812 Encounter for surgical aftercare following surgery on the circulatory system: Secondary | ICD-10-CM

## 2021-11-11 ENCOUNTER — Telehealth: Payer: Self-pay

## 2021-11-11 NOTE — Telephone Encounter (Signed)
Patient's wife Albert Harris called in stating pt has worsening RLE swelling x 3 weeks. States she feels his discomfort in the RLE is caused by the swelling but not in any additional pain related to the legs. She states she is unsure if he is properly elevating his leg because they do not sleep in the same room, she also stated he has not tried light compression. She denies redness, N/V, and/or fevers. Pt has hx of  RIGHT FEMORAL-POPLITEAL ARTERY BYPASS WITH RLE Saphenous Vein on 08/29/2021 also Evacuation of Lymphocele Right Groin on 09/21/2021 by Dr. Scot Dock. I spoke with our in house PA Corrie and she advised to schedule Albert Harris for and evaluation of the RLE and once he arrives if we need to add a U/S we can do so at that time after a better assessment of his leg.  His wife was provided with appt details and voiced her understanding.

## 2021-11-12 ENCOUNTER — Ambulatory Visit: Payer: Medicare Other

## 2021-12-17 ENCOUNTER — Ambulatory Visit (INDEPENDENT_AMBULATORY_CARE_PROVIDER_SITE_OTHER): Payer: Medicare Other | Admitting: Primary Care

## 2021-12-26 ENCOUNTER — Encounter (INDEPENDENT_AMBULATORY_CARE_PROVIDER_SITE_OTHER): Payer: Self-pay | Admitting: Primary Care

## 2021-12-26 ENCOUNTER — Ambulatory Visit (INDEPENDENT_AMBULATORY_CARE_PROVIDER_SITE_OTHER): Payer: Medicare Other | Admitting: Primary Care

## 2021-12-26 VITALS — BP 195/88 | HR 84 | Temp 98.1°F | Ht 68.0 in | Wt 181.4 lb

## 2021-12-26 DIAGNOSIS — Z760 Encounter for issue of repeat prescription: Secondary | ICD-10-CM | POA: Diagnosis not present

## 2021-12-26 DIAGNOSIS — I10 Essential (primary) hypertension: Secondary | ICD-10-CM | POA: Diagnosis not present

## 2021-12-26 MED ORDER — AMLODIPINE BESYLATE 10 MG PO TABS: 10.0000 mg | ORAL_TABLET | Freq: Every day | ORAL | 1 refills | Status: DC

## 2021-12-26 MED ORDER — METOPROLOL SUCCINATE ER 25 MG PO TB24: 25.0000 mg | ORAL_TABLET | Freq: Every day | ORAL | 1 refills | Status: DC

## 2021-12-31 ENCOUNTER — Emergency Department (HOSPITAL_COMMUNITY): Payer: Medicare Other

## 2021-12-31 ENCOUNTER — Emergency Department (HOSPITAL_COMMUNITY)
Admission: EM | Admit: 2021-12-31 | Discharge: 2022-01-01 | Disposition: A | Discharge status: Home / Self Care | Payer: Medicare Other | Attending: Emergency Medicine | Admitting: Emergency Medicine

## 2021-12-31 ENCOUNTER — Other Ambulatory Visit: Payer: Self-pay

## 2021-12-31 DIAGNOSIS — R059 Cough, unspecified: Secondary | ICD-10-CM | POA: Insufficient documentation

## 2021-12-31 DIAGNOSIS — Z992 Dependence on renal dialysis: Secondary | ICD-10-CM | POA: Insufficient documentation

## 2021-12-31 DIAGNOSIS — I10 Essential (primary) hypertension: Secondary | ICD-10-CM

## 2021-12-31 DIAGNOSIS — Z79899 Other long term (current) drug therapy: Secondary | ICD-10-CM | POA: Diagnosis not present

## 2021-12-31 DIAGNOSIS — Z7982 Long term (current) use of aspirin: Secondary | ICD-10-CM | POA: Diagnosis not present

## 2021-12-31 DIAGNOSIS — I132 Hypertensive heart and chronic kidney disease with heart failure and with stage 5 chronic kidney disease, or end stage renal disease: Secondary | ICD-10-CM | POA: Insufficient documentation

## 2021-12-31 DIAGNOSIS — J449 Chronic obstructive pulmonary disease, unspecified: Secondary | ICD-10-CM | POA: Insufficient documentation

## 2021-12-31 DIAGNOSIS — J45909 Unspecified asthma, uncomplicated: Secondary | ICD-10-CM | POA: Insufficient documentation

## 2021-12-31 DIAGNOSIS — E875 Hyperkalemia: Secondary | ICD-10-CM | POA: Insufficient documentation

## 2021-12-31 DIAGNOSIS — R1084 Generalized abdominal pain: Secondary | ICD-10-CM | POA: Diagnosis not present

## 2021-12-31 DIAGNOSIS — I509 Heart failure, unspecified: Secondary | ICD-10-CM | POA: Insufficient documentation

## 2021-12-31 DIAGNOSIS — N186 End stage renal disease: Secondary | ICD-10-CM | POA: Diagnosis not present

## 2021-12-31 DIAGNOSIS — R0602 Shortness of breath: Secondary | ICD-10-CM | POA: Insufficient documentation

## 2021-12-31 LAB — CBC WITH DIFFERENTIAL/PLATELET
Abs Immature Granulocytes: 0.13 10*3/uL — ABNORMAL HIGH (ref 0.00–0.07)
Basophils Absolute: 0.1 10*3/uL (ref 0.0–0.1)
Basophils Relative: 1 %
Eosinophils Absolute: 0.3 10*3/uL (ref 0.0–0.5)
Eosinophils Relative: 5 %
HCT: 36.4 % — ABNORMAL LOW (ref 39.0–52.0)
Hemoglobin: 11.4 g/dL — ABNORMAL LOW (ref 13.0–17.0)
Immature Granulocytes: 2 %
Lymphocytes Relative: 19 %
Lymphs Abs: 1.3 10*3/uL (ref 0.7–4.0)
MCH: 33.9 pg (ref 26.0–34.0)
MCHC: 31.3 g/dL (ref 30.0–36.0)
MCV: 108.3 fL — ABNORMAL HIGH (ref 80.0–100.0)
Monocytes Absolute: 0.5 10*3/uL (ref 0.1–1.0)
Monocytes Relative: 8 %
Neutro Abs: 4.8 10*3/uL (ref 1.7–7.7)
Neutrophils Relative %: 65 %
Platelets: 128 10*3/uL — ABNORMAL LOW (ref 150–400)
RBC: 3.36 MIL/uL — ABNORMAL LOW (ref 4.22–5.81)
RDW: 17.6 % — ABNORMAL HIGH (ref 11.5–15.5)
WBC: 7.2 10*3/uL (ref 4.0–10.5)
nRBC: 0.8 % — ABNORMAL HIGH (ref 0.0–0.2)

## 2021-12-31 LAB — COMPREHENSIVE METABOLIC PANEL
ALT: 15 U/L (ref 0–44)
AST: 22 U/L (ref 15–41)
Albumin: 4.3 g/dL (ref 3.5–5.0)
Alkaline Phosphatase: 85 U/L (ref 38–126)
Anion gap: 25 — ABNORMAL HIGH (ref 5–15)
BUN: 98 mg/dL — ABNORMAL HIGH (ref 8–23)
CO2: 16 mmol/L — ABNORMAL LOW (ref 22–32)
Calcium: 9.8 mg/dL (ref 8.9–10.3)
Chloride: 96 mmol/L — ABNORMAL LOW (ref 98–111)
Creatinine, Ser: 18.67 mg/dL — ABNORMAL HIGH (ref 0.61–1.24)
GFR, Estimated: 2 mL/min — ABNORMAL LOW (ref >60)
Glucose, Bld: 93 mg/dL (ref 70–99)
Potassium: 6 mmol/L — ABNORMAL HIGH (ref 3.5–5.1)
Sodium: 137 mmol/L (ref 135–145)
Total Bilirubin: 1.2 mg/dL (ref 0.3–1.2)
Total Protein: 7.8 g/dL (ref 6.5–8.1)

## 2021-12-31 LAB — BRAIN NATRIURETIC PEPTIDE: B Natriuretic Peptide: 2789.8 pg/mL — ABNORMAL HIGH (ref 0.0–100.0)

## 2021-12-31 LAB — MAGNESIUM: Magnesium: 3.1 mg/dL — ABNORMAL HIGH (ref 1.7–2.4)

## 2021-12-31 MED ORDER — FUROSEMIDE 10 MG/ML IJ SOLN
40.0000 mg | Freq: Once | INTRAMUSCULAR | Status: AC
  Administered 2021-12-31: 40 mg via INTRAVENOUS
  Filled 2021-12-31: qty 4

## 2021-12-31 MED ORDER — SODIUM ZIRCONIUM CYCLOSILICATE 10 G PO PACK
10.0000 g | PACK | Freq: Every day | ORAL | Status: DC
  Filled 2021-12-31: qty 1

## 2021-12-31 MED ORDER — INSULIN ASPART 100 UNIT/ML IV SOLN
5.0000 [IU] | Freq: Once | INTRAVENOUS | Status: AC
Start: 2021-12-31 — End: 2021-12-31
  Administered 2021-12-31: 5 [IU] via INTRAVENOUS

## 2021-12-31 MED ORDER — DEXTROSE 50 % IV SOLN
1.0000 | Freq: Once | INTRAVENOUS | Status: AC
  Administered 2021-12-31: 50 mL via INTRAVENOUS
  Filled 2021-12-31: qty 50

## 2021-12-31 MED ORDER — METOPROLOL TARTRATE 5 MG/5ML IV SOLN
10.0000 mg | Freq: Once | INTRAVENOUS | Status: AC
Start: 2021-12-31 — End: 2021-12-31
  Administered 2021-12-31: 10 mg via INTRAVENOUS
  Filled 2021-12-31: qty 10

## 2021-12-31 MED ORDER — MORPHINE SULFATE (PF) 4 MG/ML IV SOLN
4.0000 mg | Freq: Once | INTRAVENOUS | Status: AC
  Administered 2021-12-31: 4 mg via INTRAVENOUS
  Filled 2021-12-31: qty 1

## 2021-12-31 MED ORDER — ALBUTEROL SULFATE (2.5 MG/3ML) 0.083% IN NEBU
10.0000 mg | INHALATION_SOLUTION | Freq: Once | RESPIRATORY_TRACT | Status: AC
  Administered 2021-12-31: 10 mg via RESPIRATORY_TRACT
  Filled 2021-12-31: qty 12

## 2021-12-31 NOTE — ED Triage Notes (Signed)
Pt via POV with daughter d/t increased SOB and left upper abdominal pain. Pt states he missed dialysis earlier today. Last dialysis was a week ago. Abdomen distended in triage. Pt increase SOB when answer questions in triage.

## 2021-12-31 NOTE — ED Provider Notes (Signed)
[place] EMERGENCY DEPARTMENT Provider Note   CSN: [id number] Arrival date & time: 12/31/21  2016     History  Chief Complaint  Patient presents with   Shortness of Breath   Abdominal Pain    Albert Harris is a 66 y.o. male with history of end-stage renal disease on hemodialysis  (Tuesday/Thursday/Saturday), hypertension, asthma, [name], COPD, CHF.    Presents to the emergency department for complaint of shortness of breath and upper abdominal pain.  Patient reports that his abdominal pain started last Thursday.  Pain has been intermittent and progressively worsening since then.  Patient denies any aggravating or alleviating factors.  Patient denies any EtOH or illicit drug use.  Reports that shortness of breath started yesterday.  Shortness of breath has been intermittent since then.  Patient reports that shortness of breath is worse when laying flat.  Patient also endorses paroxysmal nocturnal dyspnea.  Patient reports that he is not on any home oxygen.  Patient reports that he developed a cough today.  Cough is nonproductive.  Patient states that he missed his last 2 episodes of dialysis due to transportation issues.  Patient last had dialysis on 7/20.     Shortness of Breath Associated symptoms: abdominal pain and cough   Associated symptoms: no chest pain, no fever, no headaches, no neck pain, no rash and no vomiting   Abdominal Pain Associated symptoms: cough and shortness of breath   Associated symptoms: no chest pain, no chills, no constipation, no diarrhea, no fever, no nausea and no vomiting        Home Medications Prior to Admission medications   Medication Sig Start Date End Date Taking? Authorizing Provider  acetaminophen (TYLENOL) 500 MG tablet Take 1,000 mg by mouth daily as needed for mild pain. Patient not taking: Reported on 09/20/2021    [provider]  albuterol (VENTOLIN HFA) 108 (90 Base) MCG/ACT inhaler INHALE 1-2 PUFFS INTO  THE LUNGS EVERY 6 (SIX) HOURS AS NEEDED FOR WHEEZING OR SHORTNESS OF BREATH. Patient taking differently: Inhale 2 puffs into the lungs every 6 (six) hours as needed for wheezing or shortness of breath. 07/02/20 10/11/21  Maness, Arnette Norris, MD  amLODipine (NORVASC) 10 MG tablet Take 1 tablet (10 mg total) by mouth daily. 12/26/21   Kerin Perna, NP  aspirin EC 81 MG EC tablet Take 1 tablet (81 mg total) by mouth daily. Swallow whole. Patient not taking: Reported on 09/03/2021 08/30/21   Lacinda Axon, MD  calcium acetate (PHOSLO) 667 MG capsule Take 2 capsule by mouth three times a day with meals Patient not taking: Reported on 09/20/2021 06/17/21     metoprolol succinate (TOPROL-XL) 25 MG 24 hr tablet Take 1 tablet (25 mg total) by mouth daily. 12/26/21   Kerin Perna, NP      Allergies    Lidocaine-prilocaine    Review of Systems   Review of Systems  Constitutional:  Negative for chills and fever.  Eyes:  Negative for visual disturbance.  Respiratory:  Positive for cough and shortness of breath.   Cardiovascular:  Negative for chest pain, palpitations and leg swelling.  Gastrointestinal:  Positive for abdominal pain. Negative for abdominal distention, anal bleeding, blood in stool, constipation, diarrhea, nausea, rectal pain and vomiting.  Genitourinary:  Negative for difficulty urinating.  Musculoskeletal:  Negative for back pain and neck pain.  Skin:  Negative for color change and rash.  Neurological:  Negative for dizziness, syncope, light-headedness and headaches.  Psychiatric/Behavioral:  Negative for confusion.     Physical Exam Updated Vital Signs BP (!) 208/114   Pulse 78   Temp 97.9 F (36.6 C) (Oral)   Resp (!) 30   SpO2 100%  Physical Exam Vitals and nursing note reviewed.  Constitutional:      General: He is not in acute distress.    Appearance: He is not ill-appearing, toxic-appearing or diaphoretic.  HENT:     Head: Normocephalic.  Eyes:     General:  No scleral icterus.       Right eye: No discharge.        Left eye: No discharge.  Cardiovascular:     Rate and Rhythm: Normal rate.  Pulmonary:     Effort: Pulmonary effort is normal. No tachypnea, bradypnea or respiratory distress.     Comments: Minimal expiratory wheezing noted throughout Abdominal:     General: Abdomen is protuberant. Bowel sounds are normal. There is no distension. There are no signs of injury.     Palpations: Abdomen is soft. There is no mass or pulsatile mass.     Tenderness: There is abdominal tenderness. There is no right CVA tenderness, left CVA tenderness, guarding or rebound.     Hernia: There is no hernia in the umbilical area or ventral area.     Comments: Diffuse tenderness throughout entire abdomen  Musculoskeletal:     Comments: Baseline edema to right lower extremity.  Surgical wound to right groin is clean, dry, and intact with no surrounding erythema or purulent discharge.  No swelling or edema to left lower extremity.  No tenderness or erythema to bilateral lower extremities.  Skin:    General: Skin is warm and dry.  Neurological:     General: No focal deficit present.     Mental Status: He is alert and oriented to person, place, and time.     GCS: GCS eye subscore is 4. GCS verbal subscore is 5. GCS motor subscore is 6.  Psychiatric:        Behavior: Behavior is cooperative.     ED Results / Procedures / Treatments   Labs (all labs ordered are listed, but only abnormal results are displayed) Labs Reviewed  CBC WITH DIFFERENTIAL/PLATELET - Abnormal; Notable for the following components:      Result Value   RBC 3.36 (*)    Hemoglobin 11.4 (*)    HCT 36.4 (*)    MCV 108.3 (*)    RDW 17.6 (*)    Platelets 128 (*)    nRBC 0.8 (*)    Abs Immature Granulocytes 0.13 (*)    All other components within normal limits  COMPREHENSIVE METABOLIC PANEL - Abnormal; Notable for the following components:   Potassium 6.0 (*)    Chloride 96 (*)    CO2 16  (*)    BUN 98 (*)    Creatinine, Ser 18.67 (*)    GFR, Estimated 2 (*)    Anion gap 25 (*)    All other components within normal limits  MAGNESIUM - Abnormal; Notable for the following components:   Magnesium 3.1 (*)    All other components within normal limits  BRAIN NATRIURETIC PEPTIDE - Abnormal; Notable for the following components:   B Natriuretic Peptide 2,789.8 (*)    All other components within normal limits  CBG MONITORING, ED - Abnormal; Notable for the following components:   Glucose-Capillary 67 (*)    All other components within normal limits  LIPASE, BLOOD  BASIC METABOLIC  PANEL  CBG MONITORING, ED    EKG EKG Interpretation  Date/Time:  Wednesday December 31 2021 21:49:24 EDT Ventricular Rate:  80 PR Interval:  167 QRS Duration: 89 QT Interval:  407 QTC Calculation: 470 R Axis:   28 Text Interpretation: Sinus rhythm Probable left atrial enlargement No significant change since last tracing Confirmed by Isla Pence [phone]) on 12/31/2021 10:17:11 PM  Radiology DG Chest 2 View  Result Date: 12/31/2021 CLINICAL DATA:  Shortness of breath. EXAM: CHEST - 2 VIEW COMPARISON:  August 07, 2021. FINDINGS: Stable cardiomegaly. Both lungs are clear. The visualized skeletal structures are unremarkable. IMPRESSION: No active cardiopulmonary disease. Electronically Signed   By: Marijo Conception M.D.   On: 12/31/2021 21:35    Procedures Procedures    Medications Ordered in ED Medications  sodium zirconium cyclosilicate (LOKELMA) packet 10 g (has no administration in time range)  amLODipine (NORVASC) tablet 10 mg (has no administration in time range)  metoprolol succinate (TOPROL-XL) 24 hr tablet 25 mg (has no administration in time range)  furosemide (LASIX) injection 40 mg (40 mg Intravenous Given 12/31/21 2236)  albuterol (PROVENTIL) (2.5 MG/3ML) 0.083% nebulizer solution 10 mg (10 mg Nebulization Given 12/31/21 2233)  insulin aspart (novoLOG) injection 5 Units (5 Units  Intravenous Given 12/31/21 2237)    And  dextrose 50 % solution 50 mL (50 mLs Intravenous Given 12/31/21 2237)  metoprolol tartrate (LOPRESSOR) injection 10 mg (10 mg Intravenous Given 12/31/21 2236)  morphine (PF) 4 MG/ML injection 4 mg (4 mg Intravenous Given 12/31/21 2327)  iohexol (OMNIPAQUE) 300 MG/ML solution 100 mL (100 mLs Intravenous Contrast Given 01/01/22 0119)  morphine (PF) 2 MG/ML injection 2 mg (2 mg Intravenous Given 01/01/22 0407)    ED Course/ Medical Decision Making/ A&P Clinical Course as of 01/01/22 0550  Wed Dec 31, 2021  2309 I spoke to nephrologist Dr. Hollie Salk.  Dr. Hollie Salk advised that the patient has transportation for dialysis tomorrow he can only discharge.  If unable to follow-up with dialysis in outpatient setting will be seen by nephrology team in the morning for hemodialysis.  Regarding patient's blood pressure advised that patient can receive his home medications in addition to the Lopressor he has already received. [PB]    Clinical Course User Index [PB] Loni Beckwith, PA-C                           Medical Decision Making Amount and/or Complexity of Data Reviewed Labs: ordered. Radiology: ordered.  Risk OTC drugs. Prescription drug management.   Alert 66 year old male in no acute distress, nontoxic-appearing.  Presents emergency department complaint of shortness of breath and abdominal pain.  Information was obtained from patient.  I reviewed patient's past medical records including previous bladder notes, labs, and imaging.  Patient has medical history as outlined in HPI which complicates his care.  With reports of missed dialysis in the setting of shortness of breath BNP, chest x-ray, and EKG were obtained.  Due to reports of abdominal pain CMP and lipase were obtained.  We will give patient Lopressor to treat hypertension.  We will give patient morphine for pain management.  We will add on CT abdomen pelvis to evaluate for patient's abdominal  pain.  Patient care discussed with attending physician Dr. Natasha Bence.  I personally viewed interpret patient's EKG.  Tracing shows sinus.  I personally viewed and interpreted patient's chest x-ray.  Imaging shows no active cardiopulmonary disease.  I personally viewed  and interpreted patient's lab results.  Pertinent findings include: -BUN 98, creatinine 18.67; elevated due to patient's ESRD and missed dialysis -Potassium 6.0 -Bicarb 16, anion gap 25 -BNP 2789  Due to patient's hyperkalemia will treat with insulin, albuterol, and Lasix.  Bicarb and calcium gluconate held due to patient not having any EKG changes.  Due to missed dialysis, hypertension, and hyperkalemia will consult nephrologist.  I spoke to Dr. Hollie Salk, please see above note for further details on this conversation.  After speaking with the patient he is not confident that he will be able to make it to dialysis tomorrow.  We will keep patient in the emergency department until he can be evaluated by nephrology team for dialysis in the hospital.  I personally viewed interpret patient CT imaging.  Agree with radiology interpretation no acute abdominal/pelvic abnormalities.  While in the emergency department patient has improvement in his blood pressure after receiving medications.  We will recheck BMP at this time.  Is resting comfortably in no acute distress.  Awaiting nephrology consult.  Disposition pending nephrology consult.  Patient care transferred to [place] at the end of my shift. Patient presentation, ED course, and plan of care discussed with review of all pertinent labs and imaging. Please see his/her note for further details regarding further ED course and disposition.         Final Clinical Impression(s) / ED Diagnoses Final diagnoses:  None    Rx / DC Orders ED Discharge Orders     None         Loni Beckwith, PA-C 01/01/22 3016    Isla Pence, MD 01/02/22 1004

## 2021-12-31 NOTE — ED Provider Triage Note (Signed)
Emergency Medicine Provider Triage Evaluation Note  TRAVIAN KERNER , a 66 y.o. male  was evaluated in triage.  Pt complains of shortness of breath. States that he has missed 2 dialysis sessions due to not having a ride. Last session was on Thursday 6 days ago. Endorses significant difficulty catching his breath as well as abdominal distention.   Review of Systems  Positive:  Negative:   Physical Exam  BP (!) 198/111   Pulse 77   Temp 97.9 F (36.6 C) (Oral)   Resp 18   SpO2 100%  Gen:   Awake, no distress   Resp:  Tachypneic and having difficulty completing sentences MSK:   Moves extremities without difficulty  Other:  Significant abdominal distention  Medical Decision Making  Medically screening exam initiated at 9:02 PM.  Appropriate orders placed.  JAECOB LOWDEN was informed that the remainder of the evaluation will be completed by another provider, this initial triage assessment does not replace that evaluation, and the importance of remaining in the ED until their evaluation is complete.  Informed nursing staff patient will need next available room   Nestor Lewandowsky 12/31/21 2105

## 2022-01-01 ENCOUNTER — Emergency Department (HOSPITAL_COMMUNITY): Payer: Medicare Other

## 2022-01-01 LAB — CBC
HCT: 32.2 % — ABNORMAL LOW (ref 39.0–52.0)
Hemoglobin: 10.5 g/dL — ABNORMAL LOW (ref 13.0–17.0)
MCH: 34.7 pg — ABNORMAL HIGH (ref 26.0–34.0)
MCHC: 32.6 g/dL (ref 30.0–36.0)
MCV: 106.3 fL — ABNORMAL HIGH (ref 80.0–100.0)
Platelets: 126 10*3/uL — ABNORMAL LOW (ref 150–400)
RBC: 3.03 MIL/uL — ABNORMAL LOW (ref 4.22–5.81)
RDW: 17.6 % — ABNORMAL HIGH (ref 11.5–15.5)
WBC: 7.4 10*3/uL (ref 4.0–10.5)
nRBC: 0.8 % — ABNORMAL HIGH (ref 0.0–0.2)

## 2022-01-01 LAB — BASIC METABOLIC PANEL
Anion gap: 24 — ABNORMAL HIGH (ref 5–15)
BUN: 102 mg/dL — ABNORMAL HIGH (ref 8–23)
CO2: 16 mmol/L — ABNORMAL LOW (ref 22–32)
Calcium: 9.3 mg/dL (ref 8.9–10.3)
Chloride: 96 mmol/L — ABNORMAL LOW (ref 98–111)
Creatinine, Ser: 19.46 mg/dL — ABNORMAL HIGH (ref 0.61–1.24)
GFR, Estimated: 2 mL/min — ABNORMAL LOW (ref >60)
Glucose, Bld: 87 mg/dL (ref 70–99)
Potassium: 6.2 mmol/L — ABNORMAL HIGH (ref 3.5–5.1)
Sodium: 136 mmol/L (ref 135–145)

## 2022-01-01 LAB — HEPATITIS B SURFACE ANTIBODY,QUALITATIVE: Hep B S Ab: REACTIVE — AB

## 2022-01-01 LAB — HEPATITIS C ANTIBODY: HCV Ab: NONREACTIVE

## 2022-01-01 LAB — HEPATITIS B SURFACE ANTIGEN: Hepatitis B Surface Ag: NONREACTIVE

## 2022-01-01 LAB — LIPASE, BLOOD: Lipase: 31 U/L (ref 11–51)

## 2022-01-01 LAB — CBG MONITORING, ED
Glucose-Capillary: 67 mg/dL — ABNORMAL LOW (ref 70–99)
Glucose-Capillary: 90 mg/dL (ref 70–99)
Glucose-Capillary: 90 mg/dL (ref 70–99)

## 2022-01-01 LAB — HEPATITIS B CORE ANTIBODY, TOTAL: Hep B Core Total Ab: NONREACTIVE

## 2022-01-01 MED ORDER — SODIUM ZIRCONIUM CYCLOSILICATE 10 G PO PACK
10.0000 g | PACK | Freq: Every day | ORAL | Status: DC
  Administered 2022-01-01: 10 g via ORAL

## 2022-01-01 MED ORDER — AMLODIPINE BESYLATE 5 MG PO TABS
10.0000 mg | ORAL_TABLET | Freq: Every day | ORAL | Status: DC
  Administered 2022-01-01: 10 mg via ORAL
  Filled 2022-01-01: qty 2

## 2022-01-01 MED ORDER — MORPHINE SULFATE (PF) 2 MG/ML IV SOLN
2.0000 mg | Freq: Once | INTRAVENOUS | Status: AC
  Administered 2022-01-01: 2 mg via INTRAVENOUS
  Filled 2022-01-01: qty 1

## 2022-01-01 MED ORDER — HYDROCODONE-ACETAMINOPHEN 5-325 MG PO TABS
1.0000 | ORAL_TABLET | Freq: Once | ORAL | Status: AC
  Administered 2022-01-01: 1 via ORAL
  Filled 2022-01-01: qty 1

## 2022-01-01 MED ORDER — IOHEXOL 300 MG/ML  SOLN
100.0000 mL | Freq: Once | INTRAMUSCULAR | Status: AC | PRN
  Administered 2022-01-01: 100 mL via INTRAVENOUS

## 2022-01-01 MED ORDER — CHLORHEXIDINE GLUCONATE CLOTH 2 % EX PADS
6.0000 | MEDICATED_PAD | Freq: Every day | CUTANEOUS | Status: DC
Start: 2022-01-01 — End: 2022-01-02

## 2022-01-01 MED ORDER — TRAMADOL HCL 50 MG PO TABS
50.0000 mg | ORAL_TABLET | Freq: Once | ORAL | Status: AC
  Administered 2022-01-01: 50 mg via ORAL
  Filled 2022-01-01: qty 1

## 2022-01-01 MED ORDER — METOPROLOL SUCCINATE ER 25 MG PO TB24
25.0000 mg | ORAL_TABLET | Freq: Every day | ORAL | Status: DC
  Administered 2022-01-01: 25 mg via ORAL
  Filled 2022-01-01: qty 1

## 2022-01-01 NOTE — Discharge Instructions (Signed)
Additional transportation resources:  [place] Phone: [phone]  Contact patient's medical insurance provider [facility]) for transportation benefits and process to schedule services. Contact customer service phone number on medical insurance card.

## 2022-01-01 NOTE — ED Provider Notes (Signed)
Signout received  on this 66 year old male who presented for abdominal pain and shortness of breath.  At the time of signout patient is awaiting nephrology consult to determine if patient needs dialysis while he is in the hospital or if he is appropriate for outpatient dialysis.    Physical Exam  BP (!) 171/80   Pulse 63   Temp (!) 97.4 F (36.3 C) (Oral)   Resp 20   SpO2 98%     Procedures  Procedures  ED Course / MDM   Clinical Course as of 01/01/22 0726  Wed Dec 31, 2021  2309 I spoke to nephrologist Dr. Hollie Salk.  Dr. Hollie Salk advised that the patient has transportation for dialysis tomorrow he can only discharge.  If unable to follow-up with dialysis in outpatient setting will be seen by nephrology team in the morning for hemodialysis.  Regarding patient's blood pressure advised that patient can receive his home medications in addition to the Lopressor he has already received. [PB]    Clinical Course User Index [PB] Loni Beckwith, PA-C   Medical Decision Making Amount and/or Complexity of Data Reviewed Radiology: ordered.  Risk OTC drugs. Prescription drug management.   Patient evaluated by Dr. Melvia Heaps of nephrology.  He plans on getting patient dialyzed today while he is in the emergency department.  Does not recommend admission.  Case management is also involved to help patient establish transportation for his outpatient dialysis sessions.  Patient's transportation has been worked out according to nephrology.  Once patient returns from dialysis he will be appropriate for discharge.  He will need to be reevaluated and discharged at that time by oncoming team.       Evlyn Courier, PA-C 01/01/22 1532    Charlesetta Shanks, MD 01/07/22 0730

## 2022-01-01 NOTE — Progress Notes (Signed)
Pt seen in ED, asked to see for need for HD.  K+ 6.0 > 6.3 repeat, CXR no edema. TTS at [facility], has missed 2-3 sessions as he lost his ride since his daughters kids got moved to [place] which requires her to take them every morning and she can no longer drive him to HD.  Has multiple c/o's including R leg swelling > L, some abd pain (mild), no N/V or confusion. B/Cr are up c/w missed HD.  Needs HD here x 1, but don't think he needs admission assuming we can set up public transportation to HD for him by the end of the day. Will involve our [place] for this.   Kelly Splinter, MD 01/01/2022, 9:48 AM

## 2022-01-01 NOTE — ED Provider Notes (Signed)
Patient has returned to the emergency department after having hemodialysis.  Patient reports he is feeling fine he is waiting for his daughter to pick him up.  He denies any current complaints.  Patient reports his shortness of breath has resolved. I reviewed notes from nephrologist who feels patient is stable for discharge. Social work has evaluated patient and has set up transportation for him to his dialysis center on Saturday patient is discharged in stable condition   Sidney Ace 01/01/22 2136    Davonna Belling, MD 01/01/22 [phone]

## 2022-01-01 NOTE — ED Notes (Signed)
HD has initiated transport to pick pt up for HD, pending arrival.

## 2022-01-01 NOTE — Progress Notes (Signed)
Transition of Care [facility]) - Emergency Department Mini Assessment   Patient Details  Name: Albert Harris MRN: [id number] Date of Birth: 1955-07-31  [facility]) CM/SW Contact:    Gaetano Hawthorne Tarpley-Carter, [place] Phone Number: 01/01/2022, 4:01 PM   Clinical Narrative: TOC CSW spoke with Vesta Mixer, pts daughter in regards to picking up pt.  Deana said she was on her way to pick up pt.  Demarie Uhlig Tarpley-Carter, MSW, LCSW-A Pronouns:  She/Her/Hers [facility] Clinical Social Worker Direct Number:  [phone] [email or web address]  ED Mini Assessment: What brought you to the Emergency Department? : HD  Barriers to Discharge: No Barriers Identified     Means of departure: Car  Interventions which prevented an admission or readmission: Transportation Screening    Patient Contact and Communications Key Contact 1: Vesta Mixer   Spoke with: Jacklynn Ganong Date: 01/01/22,     Contact Phone Number: [phone] Call outcome: Deana is on the way to pick up pt.      Choice offered to / list presented to : NA  Admission diagnosis:  Missed dialysis; multiple issues Patient Active Problem List   Diagnosis Date Noted   Surgical wound infection 09/20/2021   PAD (peripheral artery disease) ([place]) 09/05/2021   Critical limb ischemia of right lower extremity ([place])    Open wound of right great toe 08/28/2021   Bacteremia due to Gram-positive bacteria 63/84/6659   Acute metabolic encephalopathy 93/57/0177   Hyperkalemia 05/05/2021   Elevated LFTs 05/05/2021   Hypoglycemia 05/05/2021   Thrombocytopenia ([place]) 05/05/2021   Fall at home, initial encounter 05/05/2021   Goals of care, counseling/discussion    Abdominal swelling    Acute on chronic diastolic CHF (congestive heart failure) ([place]) 12/15/2020   SOB (shortness of breath) 12/15/2020   Hypertension    COPD (chronic obstructive pulmonary disease) (HCC)    Volume overload  12/14/2020   ESRD (end stage renal disease) ([place])    Lower GI bleed    Pressure injury of skin 06/28/2020   Acute renal failure superimposed on stage 5 chronic kidney disease, not on chronic dialysis ([place])    Acute respiratory disease due to COVID-19 virus 06/16/2020   CKD (chronic kidney disease) stage 5, GFR less than 15 ml/min (HCC) 03/19/2020   Claudication in peripheral vascular disease ([place]) 09/12/2019   Carotid stenosis 09/12/2019   PFO (patent foramen ovale) 08/10/2019   Noncompliance 08/10/2019   Hx of transient ischemic attack ([name]) 08/10/2019   Chronic diastolic heart failure (HCC)    Elevated troponin    Hypertensive emergency    Acute respiratory failure with hypoxia ([place]) 07/18/2019   Acute pulmonary edema ([place]) 07/18/2019   ARF (acute renal failure) ([place]) 07/18/2019   Macrocytic anemia 07/18/2019   Right sided weakness 11/09/2017   CKD (chronic kidney disease), stage III ([place]) 11/09/2017   Tobacco abuse 01/19/2017   Hypertensive urgency 12/24/2016   [name] (transient ischemic attack) 12/24/2016   PCP:  Kerin Perna, NP Pharmacy:   [facility] [address] [place] [address] Phone: [phone] Fax: [phone]  [id number] - [place], [place] - [address] [place] [address] Phone: [phone] Fax: [phone]

## 2022-01-01 NOTE — ED Notes (Signed)
All discharge instructions reviewed with patient and patient verbalized understanding of same. Patient stable at time of discharge and wheel chair was provided to patient prior to leaving department.

## 2022-01-01 NOTE — Progress Notes (Signed)
When pt is up for dc please contact Albert Harris, pts daughter [phone]. She will come pick him up.  Albert Harris, Albert Harris, Albert Harris Pronouns:  She/Her/Hers [facility] of [facility] Clinical Social Worker Direct Number:  [phone] [email or web address]

## 2022-01-01 NOTE — ED Notes (Signed)
Preparing to take pt to HD, no changes, VSS, alert, NAD, calm.

## 2022-01-01 NOTE — ED Notes (Signed)
This RN spoke with patient's daughter Vesta Mixer. Daughter will come to pick up patient at this time.

## 2022-01-01 NOTE — Progress Notes (Signed)
Received patient in bed, alert and oriented. Informed consent signed and in chart.  Time tx completed: 2000  HD treatment completed as prescribed. Patient tolerated well. AVG without signs and symptoms of complications. Patient transported back to the ER, alert and orient and in no acute distress. Report given to charge RN.  Total UF removed: 4L  Medication given: Tramadol was given at 1743 by morning RN, Karl Bales.   Post HD VS: T98-169/85-70-16-99% RA  Post HD weight: 81.5kg

## 2022-01-01 NOTE — Progress Notes (Addendum)
Contact by nephrologist regarding pt being in the ED and need for transportation to out-pt HD appts. Pt receives out-pt HD at [facility] on T[facility]. Pt arrives at 10:30 for 10:50 chair time. Pt has missed multiple appts as of recently per clinic. Pt informed nephrologist that pt's daughter is unable to provide transportation to HD appts due to daughter transporting children to school. Unable to reach pt via phone. Contacted pt's daughter via phone. Daughter confirms that pt has no transportation to HD and that pt was supposed to be discussing options with clinic staff. Daughter states she can transport pt to HD appt on Saturday. Spoke to pt's significant other as well and she plans to contact clinic CSW to discuss possible options. Pt's significant other states pt does not live close to a bus line in order for pt to use public bus for transportation. Clinic is unable to provide an extra treatment tomorrow. Update provided to nephrologist and ED staff. Also contacted ED RN CM, Hulan Amato, to request that pt be provided transportation options from ED RN CM or CSW if possible. Will assist as needed.   Melven Sartorius Renal Navigator [phone]  Addendum at 3:38 pm: Clinic social worker, Marina Gravel, completed [employer] [place] application and sent to renal navigator for pt to sign while in ED. Pt signed application and emailed to [employer] with [employer]. Received response from [place] that pt has been pre-certified for transportation services. Attempted to reach pt's significant other via phone to make her aware of this info from our earlier conversation. Left a message requesting a return call. Will make sure [employer] [place] phone number is listed on AVS for pt to call and schedule appts.

## 2022-01-01 NOTE — Procedures (Addendum)
With the help of our SW, we were able to get his application for public transportation filled out and signed by the patient here. Pt will have a ride to his OP HD on Sat (daughter only available now on weekends). He should have public transportation set up for his next Tuesday HD then.  He is okay for dc after HD today from a renal standpoint, assuming stable HD session of course.   I was present at this dialysis session, have reviewed the session itself and made  appropriate changes Kelly Splinter MD [place] pager [phone]   01/01/2022, 3:12 PM

## 2022-01-01 NOTE — TOC CM/SW Note (Addendum)
RNCM received inbound call from [name] with [facility]. PAtient will receive dialysis today at [facility] and daughter can take patient to dialysis on Saturdays however patient is having transportation problems during the week.    TOC will continue to follow to determine possble transportation options with [facility] and within community.   Addendum: RNCM spoke with patient's daughter who will continue to provide transportation on Saturdays. Patient's daughter Albert Harris indicates she is not sure how he will get to diaylsis on days during the week.  RNCM spoke with patient's wife who indicates they have problems with transportation due to car not working. Albert Harris is awaiting a call back from [facility] [address] in [place] to coordinate transportation with SW at dialysis  ctr. This RNCM also advised patient's wife to call insurance company to determine his available transportation benefits and provided [employer] number [phone]. Albert Harris agreed and verbalized understanding. This RNCM will include transportation resources on AVS.    No additional TOC needs at this time.

## 2022-01-01 NOTE — ED Notes (Signed)
To HD, HD ready per Herbert Spires, RN

## 2022-01-01 NOTE — ED Notes (Signed)
Pt taken up to HD, HD not ready for pt, pt returned to [facility], no changes, remains on monitor.

## 2022-01-01 NOTE — ED Notes (Signed)
Pt sleeping, arousable to voice, alert, NAD, calm, interactive, usually has HD (T, TH, S), last HD last Thursday 1 week ago. Green top sent to lab for repeat CMP, to assess K.

## 2022-01-02 LAB — HEPATITIS B SURFACE ANTIBODY, QUANTITATIVE: Hep B S AB Quant (Post): 21.5 m[IU]/mL (ref >9.9)

## 2022-01-03 NOTE — Progress Notes (Unsigned)
Albert Harris is a 66 y.o. male presents for hypertension evaluation, Denies shortness of breath, headaches, chest pain or lower extremity edema, sudden onset, vision changes, unilateral weakness, dizziness, paresthesias   Patient {Actions; denies-reports:120008} adherence with medications.  Dietary habits include: *** Exercise habits include:*** Family / Social history: ***   Past Medical History:  Diagnosis Date   Anemia    Asthma    Carotid stenosis 09/12/2019   CHF (congestive heart failure) (HCC)    Claudication in peripheral vascular disease ([place]) 54/65/6812   Complication of anesthesia    " i HAVE A HARD TIME WAKING UP " (only one time)   COPD (chronic obstructive pulmonary disease) ([place])    COVID-19    positive on  06/16/20   ESRD on hemodialysis ([place])    t, th. sat dialysis   GERD (gastroesophageal reflux disease)    HLD (hyperlipidemia)    Hypertension    Peripheral vascular disease (HCC)    PFO with atrial septal aneurysm    by echo 11/2017   Pneumonia    Stroke [facility])    [name] (transient ischemic attack) 11/2017   Tobacco abuse    quit 07/17/19   Past Surgical History:  Procedure Laterality Date   ABDOMINAL AORTOGRAM W/LOWER EXTREMITY N/A 06/21/2020   Procedure: ABDOMINAL AORTOGRAM W/LOWER EXTREMITY;  Surgeon: Angelia Mould, MD;  Location: [place] CV LAB;  Service: Cardiovascular;  Laterality: N/A;   ABDOMINAL AORTOGRAM W/LOWER EXTREMITY Right 08/29/2021   Procedure: ABDOMINAL AORTOGRAM W/LOWER EXTREMITY;  Surgeon: Cherre Robins, MD;  Location: [place] [facility];  Service: Cardiovascular;  Laterality: Right;   APPLICATION OF WOUND VAC Right 09/21/2021   Procedure: APPLICATION OF WOUND VAC;  Surgeon: Angelia Mould, MD;  Location: [place];  Service: Vascular;  Laterality: Right;   AV FISTULA PLACEMENT Left 03/28/2020   Procedure: ARTERIOVENOUS (AV) FISTULA CREATION LEFT;  Surgeon: [name], MD;   Location: [place];  Service: Vascular;  Laterality: Left;   AV FISTULA PLACEMENT Right 09/23/2020   Procedure: RIGHT Arm ARTERIOVENOUS GRAFT CREATION.;  Surgeon: Angelia Mould, MD;  Location: [place];  Service: Vascular;  Laterality: Right;   COLONOSCOPY     COLONOSCOPY WITH PROPOFOL N/A 06/27/2020   Procedure: COLONOSCOPY WITH PROPOFOL;  Surgeon: Carol Ada, MD;  Location: [place];  Service: Endoscopy;  Laterality: N/A;   DRAINAGE AND CLOSURE OF LYMPHOCELE Right 09/21/2021   Procedure: EVACUATION OF LYMPHOCELE RIGHT GROIN;  Surgeon: Angelia Mould, MD;  Location: [place];  Service: Vascular;  Laterality: Right;   ENDARTERECTOMY FEMORAL Right 09/05/2021   Procedure: ENDARTERECTOMY FEMORAL;  Surgeon: Angelia Mould, MD;  Location: [place];  Service: Vascular;  Laterality: Right;   FEMORAL-POPLITEAL BYPASS GRAFT Right 09/05/2021   Procedure: RIGHT FEMORAL-POPLITEAL ARTERY BYPASS WITH Spaphenous Vein;  Surgeon: Angelia Mould, MD;  Location: [place];  Service: Vascular;  Laterality: Right;   [place]  06/27/2020   Procedure: HEMOSTASIS CLIP PLACEMENT;  Surgeon: Carol Ada, MD;  Location: [place];  Service: Endoscopy;;   INSERTION OF DIALYSIS CATHETER Right 06/24/2020   Procedure: INSERTION OF TUNNELED  DIALYSIS CATHETER; Removal of temporary dialysis catheter right neck;  Surgeon: Angelia Mould, MD;  Location: [place];  Service: Vascular;  Laterality: Right;   LIGATION OF ARTERIOVENOUS  FISTULA Left 07/24/2020   Procedure: LIGATION OF LEFT BRACHIOCEPHALIC ARTERIOVENOUS  FISTULA;  Surgeon: [name], MD;  [facility];  Service: Vascular;  Laterality: Left;   PERIPHERAL VASCULAR INTERVENTION Right 06/21/2020   Procedure: PERIPHERAL VASCULAR INTERVENTION;  Surgeon: Angelia Mould, MD;  Location: [place] [facility];  Service: Cardiovascular;  Laterality: Right;   TEE WITHOUT CARDIOVERSION N/A 05/13/2021    Procedure: TRANSESOPHAGEAL ECHOCARDIOGRAM (TEE);  Surgeon: Donato Heinz, MD;  Location: [facility];  Service: Cardiovascular;  Laterality: N/A;   VEIN HARVEST Right 09/05/2021   Procedure: RIGHT Saphenous VEIN HARVEST;  Surgeon: Angelia Mould, MD;  Location: [place];  Service: Vascular;  Laterality: Right;   Allergies  Allergen Reactions   Lidocaine-Prilocaine Rash    Caused red marks up and down arm   Current Outpatient Medications on File Prior to Visit  Medication Sig Dispense Refill   acetaminophen (TYLENOL) 500 MG tablet Take 1,000 mg by mouth daily as needed for mild pain.     albuterol (VENTOLIN HFA) 108 (90 Base) MCG/ACT inhaler INHALE 1-2 PUFFS INTO THE LUNGS EVERY 6 (SIX) HOURS AS NEEDED FOR WHEEZING OR SHORTNESS OF BREATH. 8 g 0   aspirin EC 81 MG EC tablet Take 1 tablet (81 mg total) by mouth daily. Swallow whole. 30 tablet 5   calcium acetate (PHOSLO) 667 MG capsule Take 2 capsule by mouth three times a day with meals 540 capsule 3   No current facility-administered medications on file prior to visit.   Social History   Socioeconomic History   Marital status: Divorced    Spouse name: Not on file   Number of children: Not on file   Years of education: Not on file   Highest education level: Not on file  Occupational History   Not on file  Tobacco Use   Smoking status: Former    Packs/day: 1.00    Years: 50.00    Total pack years: 50.00    Types: Cigarettes    Quit date: 07/17/2019    Years since quitting: 2.4   Smokeless tobacco: Never  Vaping Use   Vaping Use: Never used  Substance and Sexual Activity   Alcohol use: Not Currently    Alcohol/week: 1.0 - 3.0 standard drink of alcohol    Types: 1 - 3 Cans of beer per week   Drug use: No   Sexual activity: Not Currently  Other Topics Concern   Not on file  Social History Narrative   Married   Works in Audiological scientist   Social Determinants of Health   Financial Resource  Strain: [place]  (09/12/2019)   Overall Financial Resource Strain (CARDIA)    Difficulty of Paying Living Expenses: Not hard at all  Food Insecurity: No Food Insecurity (09/12/2019)   Hunger Vital Sign    Worried About Running Out of Food in the Last Year: Never true    [place] in the Last Year: Never true  Transportation Needs: No Transportation Needs (09/12/2019)   PRAPARE - Hydrologist (Medical): No    Lack of Transportation (Non-Medical): No  Physical Activity: Not on file  Stress: No Stress Concern Present (09/12/2019)   [place]    Feeling of Stress : Not at all  Social Connections: Not on file  Intimate Partner Violence: Not on file   Family History  Problem Relation Age of Onset   Hypertension Mother    Colon cancer Father    Stomach cancer Brother  OBJECTIVE:  Vitals:   12/26/21 1123  BP: (!) 195/88  Pulse: 84  Temp: 98.1 F (36.7 C)  TempSrc: Oral  SpO2: 94%  Weight: 181 lb 6.4 oz (82.3 kg)  Height: 5\' 8"  (1.727 m)    Physical Exam   ROS  Last 3 Office BP readings: BP Readings from Last 3 Encounters:  01/01/22 (!) 167/71  12/26/21 (!) 195/88  10/16/21 (!) 172/108    BMET    Component Value Date/Time   NA 136 01/01/2022 0737   NA 141 06/18/2021 1431   K 6.2 (H) 01/01/2022 0737   CL 96 (L) 01/01/2022 0737   CO2 16 (L) 01/01/2022 0737   GLUCOSE 87 01/01/2022 0737   BUN 102 (H) 01/01/2022 0737   BUN 20 06/18/2021 1431   CREATININE 19.46 (H) 01/01/2022 0737   CREATININE 1.15 02/19/2017 1049   CALCIUM 9.3 01/01/2022 0737   CALCIUM 8.5 (L) 06/10/2020 0900   GFRNONAA 2 (L) 01/01/2022 0737   GFRAA 6 (L) 03/04/2020 1045    Renal function: Estimated Creatinine Clearance: 3.9 mL/min (A) (by C-G formula based on SCr of 19.46 mg/dL (H)).  Clinical ASCVD: {YES/NO:21197} The ASCVD Risk score (Arnett DK, et al., 2019) failed to calculate for the  following reasons:   The valid total cholesterol range is 130 to 320 mg/dL  ASCVD risk factors include- [place]   ASSESSMENT & PLAN:  1. Primary hypertension   2. Medication refill     Meds ordered this encounter  Medications   amLODipine (NORVASC) 10 MG tablet    Sig: Take 1 tablet (10 mg total) by mouth daily.    Dispense:  90 tablet    Refill:  1    Order Specific Question:   Supervising Provider    Answer:   Tresa Garter [id number]   metoprolol succinate (TOPROL-XL) 25 MG 24 hr tablet    Sig: Take 1 tablet (25 mg total) by mouth daily.    Dispense:  90 tablet    Refill:  1    Order Specific Question:   Supervising Provider    Answer:   Tresa Garter [[id number]]   IHKVQ was seen today for hypertension.  Diagnoses and all orders for this visit:  Primary hypertension -     amLODipine (NORVASC) 10 MG tablet; Take 1 tablet (10 mg total) by mouth daily. -     metoprolol succinate (TOPROL-XL) 25 MG 24 hr tablet; Take 1 tablet (25 mg total) by mouth daily.l Medication refil -     amLODipine (NORVASC) 10 MG tablet; Take 1 tablet (10 mg total) by mouth daily. -     metoprolol succinate (TOPROL-XL) 25 MG 24 hr tablet; Take 1 tablet (25 mg total) by mouth daily.    -Counseled on lifestyle modifications for blood pressure control including reduced dietary sodium, increased exercise, weight reduction and adequate sleep. Also, educated patient about the risk for cardiovascular events, stroke and heart attack. Also counseled patient about the importance of medication adherence. If you participate in smoking, it is important to stop using tobacco as this will increase the risks associated with uncontrolled blood pressure.   -Hypertension longstanding/newly diagnosed currently *** on current medications. Patient {Is/is not:9024} adherent with current medications.   Goal BP:  For patients younger than 60: Goal BP < 130/80. For patients 60 and older: Goal BP < 140/90. For  patients with diabetes: Goal BP < 130/80. Your most recent BP: ***  Minimize salt intake. Minimize alcohol intake  This note has been created with Surveyor, quantity. Any transcriptional errors are unintentional.   Kerin Perna, NP 01/03/2022, 8:15 PM

## 2022-01-08 ENCOUNTER — Telehealth: Payer: Self-pay

## 2022-01-08 DIAGNOSIS — Z0000 Encounter for general adult medical examination without abnormal findings: Secondary | ICD-10-CM

## 2022-01-08 NOTE — Telephone Encounter (Signed)
Called patient to determine if he still had access to the Vivify cuff and need to return it. Left message for patient to return call with status update on cuff.   Breeley Bischof [place], [facility] Guide, Health Coach [address], [address] [place] [address] Telephone: [phone] Email: [email or web address]

## 2022-05-01 ENCOUNTER — Ambulatory Visit (HOSPITAL_COMMUNITY)
Admission: EM | Admit: 2022-05-01 | Discharge: 2022-05-01 | Disposition: A | Discharge status: Home / Self Care | Payer: Medicare Other | Attending: Family Medicine | Admitting: Family Medicine

## 2022-05-01 ENCOUNTER — Encounter (HOSPITAL_COMMUNITY): Payer: Self-pay | Admitting: Emergency Medicine

## 2022-05-01 DIAGNOSIS — M542 Cervicalgia: Secondary | ICD-10-CM | POA: Diagnosis not present

## 2022-05-01 DIAGNOSIS — M5010 Cervical disc disorder with radiculopathy, unspecified cervical region: Secondary | ICD-10-CM

## 2022-05-01 MED ORDER — HYDROCODONE-ACETAMINOPHEN 5-325 MG PO TABS: 1.0000 | ORAL_TABLET | Freq: Four times a day (QID) | ORAL | 0 refills | Status: DC | PRN

## 2022-05-01 MED ORDER — METHOCARBAMOL 500 MG PO TABS: 500.0000 mg | ORAL_TABLET | Freq: Two times a day (BID) | ORAL | 0 refills | Status: DC | PRN

## 2022-05-01 NOTE — Discharge Instructions (Signed)
Hydrocodone 5 mg--1 tablet every 6 hours as needed for pain.  This is best taken with food.  It can cause sleepiness or dizziness   Methocarbamol 500 mg--take 1 tablet 2 times daily as needed for muscle spasms.  Both these medications can make you sleepy  Follow-up with your primary care, especially if this is not improving completely

## 2022-05-01 NOTE — ED Triage Notes (Signed)
Pt woke up Wed morning with neck pain that radiates down right arm. Denies injury. Took Tylenol and "stuff like that but dont do no good". Pt reports that has dialysis tomorrow and can't sit in chair for 4 hours with this pain.

## 2022-05-01 NOTE — ED Provider Notes (Signed)
[place]    CSN: [id number] Arrival date & time: 05/01/22  1050      History   Chief Complaint Chief Complaint  Patient presents with   Neck Pain   Arm Pain    HPI Albert Harris is a 66 y.o. male.    Neck Pain Arm Pain  Here for right sided neck pain and posterior shoulder pain.  It radiates into his right posterior arm.  No recent injury.  He did fall at the beginning of October onto the ground when he tripped over a dog's leash.  No numbness or tingling or fever or rash.  He is a hemodialysis patient.  Tylenol has not helped.  Past Medical History:  Diagnosis Date   Anemia    Asthma    Carotid stenosis 09/12/2019   CHF (congestive heart failure) (HCC)    Claudication in peripheral vascular disease ([place]) 83/38/2505   Complication of anesthesia    " i HAVE A HARD TIME WAKING UP " (only one time)   COPD (chronic obstructive pulmonary disease) ([place])    COVID-19    positive on  06/16/20   ESRD on hemodialysis ([employer])    t, th. sat dialysis   GERD (gastroesophageal reflux disease)    HLD (hyperlipidemia)    Hypertension    Peripheral vascular disease (HCC)    PFO with atrial septal aneurysm    by echo 11/2017   Pneumonia    Stroke [facility])    [name] (transient ischemic attack) 11/2017   Tobacco abuse    quit 07/17/19    Patient Active Problem List   Diagnosis Date Noted   Surgical wound infection 09/20/2021   PAD (peripheral artery disease) ([place]) 09/05/2021   Critical limb ischemia of right lower extremity ([place])    Open wound of right great toe 08/28/2021   Bacteremia due to Gram-positive bacteria 39/76/7341   Acute metabolic encephalopathy 93/79/0240   Hyperkalemia 05/05/2021   Elevated LFTs 05/05/2021   Hypoglycemia 05/05/2021   Thrombocytopenia ([place]) 05/05/2021   Fall at home, initial encounter 05/05/2021   Goals of care, counseling/discussion    Abdominal swelling    Acute on chronic diastolic CHF (congestive heart failure) ([place]) 12/15/2020    SOB (shortness of breath) 12/15/2020   Hypertension    COPD (chronic obstructive pulmonary disease) (HCC)    Volume overload 12/14/2020   ESRD (end stage renal disease) ([place])    Lower GI bleed    Pressure injury of skin 06/28/2020   Acute renal failure superimposed on stage 5 chronic kidney disease, not on chronic dialysis ([place])    Acute respiratory disease due to COVID-19 virus 06/16/2020   CKD (chronic kidney disease) stage 5, GFR less than 15 ml/min (HCC) 03/19/2020   Claudication in peripheral vascular disease ([place]) 09/12/2019   Carotid stenosis 09/12/2019   PFO (patent foramen ovale) 08/10/2019   Noncompliance 08/10/2019   Hx of transient ischemic attack ([name]) 08/10/2019   Chronic diastolic heart failure (HCC)    Elevated troponin    Hypertensive emergency    Acute respiratory failure with hypoxia ([place]) 07/18/2019   Acute pulmonary edema ([place]) 07/18/2019   ARF (acute renal failure) ([place]) 07/18/2019   Macrocytic anemia 07/18/2019   Right sided weakness 11/09/2017   CKD (chronic kidney disease), stage III ([place]) 11/09/2017   Tobacco abuse 01/19/2017   Hypertensive urgency 12/24/2016   [name] (transient ischemic attack) 12/24/2016    Past Surgical History:  Procedure Laterality Date   ABDOMINAL AORTOGRAM W/LOWER  EXTREMITY N/A 06/21/2020   Procedure: ABDOMINAL AORTOGRAM W/LOWER EXTREMITY;  Surgeon: Angelia Mould, MD;  Location: [facility];  Service: Cardiovascular;  Laterality: N/A;   ABDOMINAL AORTOGRAM W/LOWER EXTREMITY Right 08/29/2021   Procedure: ABDOMINAL AORTOGRAM W/LOWER EXTREMITY;  Surgeon: Cherre Robins, MD;  Location: [place];  Service: Cardiovascular;  Laterality: Right;   APPLICATION OF WOUND VAC Right 09/21/2021   Procedure: APPLICATION OF WOUND VAC;  Surgeon: Angelia Mould, MD;  Location: [place];  Service: Vascular;  Laterality: Right;   AV FISTULA PLACEMENT Left 03/28/2020   Procedure: ARTERIOVENOUS (AV) FISTULA CREATION LEFT;   Surgeon: [name], MD;  Location: [facility];  Service: Vascular;  Laterality: Left;   AV FISTULA PLACEMENT Right 09/23/2020   Procedure: RIGHT Arm ARTERIOVENOUS GRAFT CREATION.;  Surgeon: Angelia Mould, MD;  Location: [facility];  Service: Vascular;  Laterality: Right;   COLONOSCOPY     COLONOSCOPY WITH PROPOFOL N/A 06/27/2020   Procedure: COLONOSCOPY WITH PROPOFOL;  Surgeon: Carol Ada, MD;  Location: [facility];  Service: Endoscopy;  Laterality: N/A;   DRAINAGE AND CLOSURE OF LYMPHOCELE Right 09/21/2021   Procedure: EVACUATION OF LYMPHOCELE RIGHT GROIN;  Surgeon: Angelia Mould, MD;  Location: [place];  Service: Vascular;  Laterality: Right;   ENDARTERECTOMY FEMORAL Right 09/05/2021   Procedure: ENDARTERECTOMY FEMORAL;  Surgeon: Angelia Mould, MD;  Location: [facility] [place];  Service: Vascular;  Laterality: Right;   FEMORAL-POPLITEAL BYPASS GRAFT Right 09/05/2021   Procedure: RIGHT FEMORAL-POPLITEAL ARTERY BYPASS WITH Spaphenous Vein;  Surgeon: Angelia Mould, MD;  Location: [place];  Service: Vascular;  Laterality: Right;   [place]  06/27/2020   Procedure: HEMOSTASIS CLIP PLACEMENT;  Surgeon: Carol Ada, MD;  Location: [facility];  Service: Endoscopy;;   INSERTION OF DIALYSIS CATHETER Right 06/24/2020   Procedure: INSERTION OF TUNNELED  DIALYSIS CATHETER; Removal of temporary dialysis catheter right neck;  Surgeon: Angelia Mould, MD;  Location: [facility];  Service: Vascular;  Laterality: Right;   LIGATION OF ARTERIOVENOUS  FISTULA Left 07/24/2020   Procedure: LIGATION OF LEFT BRACHIOCEPHALIC ARTERIOVENOUS  FISTULA;  Surgeon: [name], MD;  Location: [place];  Service: Vascular;  Laterality: Left;   PERIPHERAL VASCULAR INTERVENTION Right 06/21/2020   Procedure: PERIPHERAL VASCULAR INTERVENTION;  Surgeon: Angelia Mould, MD;  Location: [facility];  Service: Cardiovascular;  Laterality: Right;   TEE  WITHOUT CARDIOVERSION N/A 05/13/2021   Procedure: TRANSESOPHAGEAL ECHOCARDIOGRAM (TEE);  Surgeon: Donato Heinz, MD;  Location: [place];  Service: Cardiovascular;  Laterality: N/A;   VEIN HARVEST Right 09/05/2021   Procedure: RIGHT Saphenous VEIN HARVEST;  Surgeon: Angelia Mould, MD;  Location: [facility];  Service: Vascular;  Laterality: Right;       Home Medications    Prior to Admission medications   Medication Sig Start Date End Date Taking? Authorizing Provider  HYDROcodone-acetaminophen (NORCO/VICODIN) 5-325 MG tablet Take 1 tablet by mouth every 6 (six) hours as needed (pain). 05/01/22  Yes Jaylinn Hellenbrand, Gwenlyn Perking, MD  methocarbamol (ROBAXIN) 500 MG tablet Take 1 tablet (500 mg total) by mouth 2 (two) times daily as needed for muscle spasms. 05/01/22  Yes Barrett Henle, MD  acetaminophen (TYLENOL) 500 MG tablet Take 1,000 mg by mouth daily as needed for mild pain.    [provider]  albuterol (VENTOLIN HFA) 108 (90 Base) MCG/ACT inhaler INHALE 1-2 PUFFS INTO THE LUNGS EVERY 6 (SIX) HOURS  AS NEEDED FOR WHEEZING OR SHORTNESS OF BREATH. 07/02/20 02/14/22  Maness, Arnette Norris, MD  amLODipine (NORVASC) 10 MG tablet Take 1 tablet (10 mg total) by mouth daily. 12/26/21   Kerin Perna, NP  aspirin EC 81 MG EC tablet Take 1 tablet (81 mg total) by mouth daily. Swallow whole. 08/30/21   Lacinda Axon, MD  calcium acetate (PHOSLO) 667 MG capsule Take 2 capsule by mouth three times a day with meals 06/17/21     metoprolol succinate (TOPROL-XL) 25 MG 24 hr tablet Take 1 tablet (25 mg total) by mouth daily. 12/26/21   Kerin Perna, NP    Family History Family History  Problem Relation Age of Onset   Hypertension Mother    Colon cancer Father    Stomach cancer Brother     Social History Social History   Tobacco Use   Smoking status: Former    Packs/day: 1.00    Years: 50.00    Total pack years: 50.00    Types: Cigarettes    Quit date: 07/17/2019     Years since quitting: 2.7   Smokeless tobacco: Never  Vaping Use   Vaping Use: Never used  Substance Use Topics   Alcohol use: Not Currently    Alcohol/week: 1.0 - 3.0 standard drink of alcohol    Types: 1 - 3 Cans of beer per week   Drug use: No     Allergies   Lidocaine-prilocaine   Review of Systems Review of Systems  Musculoskeletal:  Positive for neck pain.     Physical Exam Triage Vital Signs ED Triage Vitals  Enc Vitals Group     BP 05/01/22 1241 (!) 174/78     Pulse Rate 05/01/22 1241 78     Resp 05/01/22 1241 20     Temp 05/01/22 1241 97.7 F (36.5 C)     Temp Source 05/01/22 1241 Oral     SpO2 05/01/22 1241 96 %     Weight --      Height --      Head Circumference --      Peak Flow --      Pain Score 05/01/22 1239 10     Pain Loc --      Pain Edu? --      Excl. in [place]? --    No data found.  Updated Vital Signs BP (!) 174/78 (BP Location: Left Arm)   Pulse 78   Temp 97.7 F (36.5 C) (Oral)   Resp 20   SpO2 96%   Visual Acuity Right Eye Distance:   Left Eye Distance:   Bilateral Distance:    Right Eye Near:   Left Eye Near:    Bilateral Near:     Physical Exam Vitals reviewed.  Constitutional:      General: He is not in acute distress.    Appearance: He is not ill-appearing, toxic-appearing or diaphoretic.  HENT:     Nose: Nose normal.     Mouth/Throat:     Mouth: Mucous membranes are moist.     Pharynx: No oropharyngeal exudate or posterior oropharyngeal erythema.  Cardiovascular:     Rate and Rhythm: Normal rate and regular rhythm.     Heart sounds: No murmur heard. Pulmonary:     Effort: Pulmonary effort is normal.     Breath sounds: Normal breath sounds.  Musculoskeletal:     Cervical back: Neck supple. Tenderness (right trapezius) present.  Lymphadenopathy:     Cervical: No  cervical adenopathy.  Skin:    Capillary Refill: Capillary refill takes less than 2 seconds.     Coloration: Skin is not jaundiced or pale.   Neurological:     General: No focal deficit present.     Mental Status: He is alert and oriented to person, place, and time.  Psychiatric:        Behavior: Behavior normal.      UC Treatments / Results  Labs (all labs ordered are listed, but only abnormal results are displayed) Labs Reviewed - No data to display  EKG   Radiology No results found.  Procedures Procedures (including critical care time)  Medications Ordered in UC Medications - No data to display  Initial Impression / Assessment and Plan / UC Course  I have reviewed the triage vital signs and the nursing notes.  Pertinent labs & imaging results that were available during my care of the patient were reviewed by me and considered in my medical decision making (see chart for details).        He has not had any narcotic fills in some time on PMP, so I sent in some hydrocodone and some methocarbamol.  I have asked him to follow-up with his primary care, to have further evaluation and possibly physical therapy, if this is not improving.  With no recent trauma, x-rays were not done today. Final Clinical Impressions(s) / UC Diagnoses   Final diagnoses:  Neck pain  Cervical disc disorder with radiculopathy of cervical region     Discharge Instructions      Hydrocodone 5 mg--1 tablet every 6 hours as needed for pain.  This is best taken with food.  It can cause sleepiness or dizziness   Methocarbamol 500 mg--take 1 tablet 2 times daily as needed for muscle spasms.  Both these medications can make you sleepy  Follow-up with your primary care, especially if this is not improving completely     ED Prescriptions     Medication Sig Dispense Auth. Provider   HYDROcodone-acetaminophen (NORCO/VICODIN) 5-325 MG tablet Take 1 tablet by mouth every 6 (six) hours as needed (pain). 12 tablet Amil Bouwman, Gwenlyn Perking, MD   methocarbamol (ROBAXIN) 500 MG tablet Take 1 tablet (500 mg total) by mouth 2 (two) times daily  as needed for muscle spasms. 20 tablet Quavis Klutz, Gwenlyn Perking, MD      I have reviewed the PDMP during this encounter.   Barrett Henle, MD 05/01/22 1322

## 2022-05-04 ENCOUNTER — Telehealth (INDEPENDENT_AMBULATORY_CARE_PROVIDER_SITE_OTHER): Payer: Self-pay

## 2022-05-04 NOTE — Telephone Encounter (Signed)
Reason for CRM: Patient was seen at the Urgent Care on Friday 05/01/2022 for severe neck pain and was advised to request for a referral. PCP has no Hospital follow up within 2 weeks                Please contact pt and schedule a hospital f/u

## 2022-05-06 ENCOUNTER — Ambulatory Visit (INDEPENDENT_AMBULATORY_CARE_PROVIDER_SITE_OTHER): Payer: Self-pay | Admitting: *Deleted

## 2022-05-06 NOTE — Telephone Encounter (Signed)
A message (CRM) was sent for pt to have a hospital follow up appt. Made on Nov. 27, 2023.   Pt. Never heard back from [facility].
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
## 2022-05-07 ENCOUNTER — Encounter (HOSPITAL_COMMUNITY): Payer: Self-pay

## 2022-05-07 ENCOUNTER — Ambulatory Visit (INDEPENDENT_AMBULATORY_CARE_PROVIDER_SITE_OTHER): Payer: Medicare Other | Admitting: Primary Care

## 2022-05-07 ENCOUNTER — Other Ambulatory Visit (HOSPITAL_COMMUNITY): Payer: Self-pay

## 2022-05-07 ENCOUNTER — Encounter (INDEPENDENT_AMBULATORY_CARE_PROVIDER_SITE_OTHER): Payer: Self-pay | Admitting: Primary Care

## 2022-05-07 VITALS — BP 184/77 | HR 61 | Resp 16 | Wt 172.4 lb

## 2022-05-07 DIAGNOSIS — Z1211 Encounter for screening for malignant neoplasm of colon: Secondary | ICD-10-CM | POA: Diagnosis not present

## 2022-05-07 DIAGNOSIS — I10 Essential (primary) hypertension: Secondary | ICD-10-CM

## 2022-05-07 DIAGNOSIS — M5010 Cervical disc disorder with radiculopathy, unspecified cervical region: Secondary | ICD-10-CM | POA: Diagnosis not present

## 2022-05-07 DIAGNOSIS — Z09 Encounter for follow-up examination after completed treatment for conditions other than malignant neoplasm: Secondary | ICD-10-CM | POA: Diagnosis not present

## 2022-05-07 DIAGNOSIS — Z760 Encounter for issue of repeat prescription: Secondary | ICD-10-CM

## 2022-05-07 MED ORDER — AMLODIPINE BESYLATE 10 MG PO TABS
10.0000 mg | ORAL_TABLET | Freq: Every day | ORAL | 1 refills | Status: DC
  Filled 2022-05-07: qty 90, 90d supply, fill #0

## 2022-05-07 MED ORDER — METHOCARBAMOL 500 MG PO TABS: 500.0000 mg | ORAL_TABLET | Freq: Two times a day (BID) | ORAL | 1 refills | Status: DC | PRN

## 2022-05-07 NOTE — Progress Notes (Signed)
[place], is a 66 y.o. male  ACZ:[id number]  FUX:[id number]  DOB - [birth year]  Chief Complaint  Patient presents with   Hospitalization Follow-up    Urgent care        Subjective:   Albert Harris is a 66 y.o. male here today for a follow up visit from Urgent care neck pain. He is stiff and can not do ROM requesting pain medication- explain writer does not write for narcotics. Patient has No headache, No chest pain, No abdominal pain - No Nausea, No new weakness tingling or numbness, No Cough - shortness of breath  No problems updated.  Allergies  Allergen Reactions   Lidocaine-Prilocaine Rash    Caused red marks up and down arm    Past Medical History:  Diagnosis Date   Anemia    Asthma    Carotid stenosis 09/12/2019   CHF (congestive heart failure) (HCC)    Claudication in peripheral vascular disease ([place]) 02/54/2706   Complication of anesthesia    " i HAVE A HARD TIME WAKING UP " (only one time)   COPD (chronic obstructive pulmonary disease) ([place])    COVID-19    positive on  06/16/20   ESRD on hemodialysis ([place])    t, th. sat dialysis   GERD (gastroesophageal reflux disease)    HLD (hyperlipidemia)    Hypertension    Peripheral vascular disease (HCC)    PFO with atrial septal aneurysm    by echo 11/2017   Pneumonia    Stroke [facility])    [name] (transient ischemic attack) 11/2017   Tobacco abuse    quit 07/17/19    Current Outpatient Medications on File Prior to Visit  Medication Sig Dispense Refill   acetaminophen (TYLENOL) 500 MG tablet Take 1,000 mg by mouth daily as needed for mild pain.     albuterol (VENTOLIN HFA) 108 (90 Base) MCG/ACT inhaler INHALE 1-2 PUFFS INTO THE LUNGS EVERY 6 (SIX) HOURS AS NEEDED FOR WHEEZING OR SHORTNESS OF BREATH. 8 g 0   aspirin EC 81 MG EC tablet Take 1 tablet (81 mg total) by mouth daily. Swallow whole. 30 tablet 5   calcium acetate (PHOSLO) 667 MG capsule Take 2 capsule by mouth three times a day  with meals 540 capsule 3   HYDROcodone-acetaminophen (NORCO/VICODIN) 5-325 MG tablet Take 1 tablet by mouth every 6 (six) hours as needed (pain). 12 tablet 0   metoprolol succinate (TOPROL-XL) 25 MG 24 hr tablet Take 1 tablet (25 mg total) by mouth daily. 90 tablet 1   No current facility-administered medications on file prior to visit.   Comprehensive ROS Pertinent positive and negative noted in HPI   Objective:  Blood Pressure (Abnormal) 184/77   Pulse 61   Respiration 16   Weight 172 lb 6.4 oz (78.2 kg)   Oxygen Saturation 98%   Body Mass Index 26.21 kg/m   Exam General appearance : Awake, alert, not in any distress. Speech Clear. Not toxic looking HEENT: Atraumatic and Normocephalic, pupils equally reactive to light and accomodation Neck: Supple, no JVD. No cervical lymphadenopathy.  Chest: Good air entry bilaterally, no added sounds  CVS: S1 S2 regular, no murmurs.  Abdomen: Bowel sounds present, Non tender and not distended with no gaurding, rigidity or rebound. Extremities: B/L Lower Ext shows no edema, both legs are warm to touch Neurology: Awake alert, and oriented X 3, Non focal Skin: No Rash  Data Review Lab Results  Component Value Date  HGBA1C 5.0 08/29/2021   HGBA1C 5.4 11/10/2017   HGBA1C 5.4 12/25/2016    Assessment & Plan  Albert Harris was seen today for hospitalization follow-up.  Diagnoses and all orders for this visit:  Cervical disc disorder with radiculopathy of cervical region -     AMB referral to orthopedics  Hospital discharge follow-up  Colon cancer screening -     Cologuard  Primary hypertension BP goal - < 130/80 Explained that having normal blood pressure is the goal and medications are helping to get to goal and maintain normal blood pressure. DIET: Limit salt intake, read nutrition labels to check salt content, limit fried and high fatty foods  Avoid using multisymptom OTC cold preparations that generally contain sudafed which can rise BP.  Consult with pharmacist on best cold relief products to use for persons with HTN EXERCISE Discussed incorporating exercise such as walking - 30 minutes most days of the week and can do in 10 minute intervals    -     amLODipine (NORVASC) 10 MG tablet; Take 1 tablet (10 mg total) by mouth daily.  Medication refill -     amLODipine (NORVASC) 10 MG tablet; Take 1 tablet (10 mg total) by mouth daily.  Other orders -     methocarbamol (ROBAXIN) 500 MG tablet; Take 1 tablet (500 mg total) by mouth 2 (two) times daily as needed for muscle spasms.  Patient have been counseled extensively about nutrition and exercise. Other issues discussed during this visit include: low cholesterol diet, weight control and daily exercise, foot care, annual eye examinations at Ophthalmology, importance of adherence with medications and regular follow-up. We also discussed long term complications of uncontrolled diabetes and hypertension.   Return for MAW.  The patient was given clear instructions to go to ER or return to medical center if symptoms don't improve, worsen or new problems develop. The patient verbalized understanding. The patient was told to call to get lab results if they haven't heard anything in the next week.   This note has been created with Surveyor, quantity. Any transcriptional errors are unintentional.   Kerin Perna, NP 05/11/2022, 4:07 PM

## 2022-05-11 ENCOUNTER — Ambulatory Visit (INDEPENDENT_AMBULATORY_CARE_PROVIDER_SITE_OTHER): Payer: Medicare Other

## 2022-05-11 ENCOUNTER — Encounter: Payer: Self-pay | Admitting: Orthopedic Surgery

## 2022-05-11 ENCOUNTER — Ambulatory Visit (INDEPENDENT_AMBULATORY_CARE_PROVIDER_SITE_OTHER): Payer: Medicare Other | Admitting: Orthopedic Surgery

## 2022-05-11 VITALS — BP 178/77 | HR 73 | Ht 68.0 in | Wt 172.5 lb

## 2022-05-11 DIAGNOSIS — M4712 Other spondylosis with myelopathy, cervical region: Secondary | ICD-10-CM

## 2022-05-11 DIAGNOSIS — M542 Cervicalgia: Secondary | ICD-10-CM | POA: Diagnosis not present

## 2022-05-11 MED ORDER — GABAPENTIN 100 MG PO CAPS: 100.0000 mg | ORAL_CAPSULE | Freq: Three times a day (TID) | ORAL | 0 refills | Status: DC

## 2022-05-11 NOTE — Progress Notes (Signed)
Orthopedic Spine Surgery Office Note  Assessment: Patient is a 66 y.o. male with neck pain that radiates into his bilateral arms and hands.  He also has had issues with fine motor skills in his hands and imbalance.  He has an mJOA of 12. Positive hoffman, L'Hermitte sign, and unsteadiness on exam.    Plan: -Explained that his symptoms and exam are concerning for myelopathy.  Explained that there is no role for conservative treatment with this condition so I want to get an MRI of his cervical spine to evaluate for myelopathy -Patient has tried activity modification, over-the-counter pain medications -Discussed the natural history of myelopathy and its stepwise decline -Prescribed gabapentin for pain relief.  Starting with 100 mg 3 times daily.  Instructed him to call the office if this is not working and we can increase the dose -Patient should return to office in 4 weeks, repeat x-rays of cervical spine at next visit: None   Patient expressed understanding of the plan and all questions were answered to the patient's satisfaction.   ___________________________________________________________________________   History:  Patient is a 66 y.o. male who presents today for cervical spine.  Patient noted acute onset of neck pain that radiates into bilateral arms and hands on 04/29/2022.  He states he feels it in the back of his neck and goes into the scapula.  It radiates down the entire spine.  It also radiates into his hands bilaterally.  There is no trauma or injury that brought on this pain.  On further questioning, he describes symptoms of imbalance that have been going on for several months.  He is been progressively getting more unsteady.  He is now at the point that he has to use a handrail to go up and down stairs because he would otherwise fall.  He has had several falls within the last year.  He also describes clumsiness with his hands.  He said he can get up button-down shirt on but with  great difficulty and often has to have his wife do it.  Has numbness and paresthesias in his left hand.  No other numbness or paresthesias.   Weakness: Yes, feels his hands are weaker.  No other weakness Difficulty with fine motor skills (e.g., buttoning shirts, handwriting): Yes, has issues with buttoning shirts and fine motor skills in his hands Symptoms of imbalance: Yes, has to use handrails to go up and down stairs Paresthesias and numbness: Yes, in his left hand.  No other numbness or paresthesias Bowel or bladder incontinence: Denies.  Is anuric and is on dialysis Saddle anesthesia: Denies  Treatments tried: Activity modification, over-the-counter pain medications  Review of systems: Denies fevers and chills, night sweats, unexplained weight loss, history of cancer.  Has had pain that wakes him up at night  Past medical history: Hyperlipidemia Hypertension Heart failure Acid reflux Stroke Chronic kidney disease on dialysis COPD Peripheral artery disease Carotid stenosis  Allergies: Lidocaine  Past surgical history:  Right leg vein bypass AV fistula placement Vein harvest  Social history: Denies use of nicotine product (smoking, vaping, patches, smokeless) Alcohol use: Denies Denies recreational drug use  Physical Exam:  General: no acute distress, appears stated age Neurologic: alert, answering questions appropriately, following commands Respiratory: unlabored breathing on room air, symmetric chest rise Psychiatric: appropriate affect, normal cadence to speech   MSK (spine):  -Strength exam      Left  Right Grip strength  5/5  5/5 Interosseus   4/5   4/5 Wrist extension  5/5  5/5 Wrist flexion   5/5  5/5 Elbow flexion   5/5  5/5 Deltoid    5/5  5/5  EHL    4/5  4/5 TA    5/5  5/5 GSC    5/5  5/5 Knee extension  5/5  5/5 Hip flexion   4/5  4/5  -Sensory exam    Sensation intact to light touch in L3-S1 nerve distributions of  bilateral lower extremities  Sensation intact to light touch in C5-T1 nerve distributions of bilateral upper extremities  -Brachioradialis DTR: 2/4 on the left, 2/4 on the right -Biceps DTR: 2/4 on the left, 1/4 on the right -Achilles DTR: 2/4 on the left, 2/4 on the right -Patellar tendon DTR: 3/4 on the left, 3/4 on the right  -Spurling: negative bilaterally -Positive Lhermitte sign -Hoffman sign: Positive on the right, negative on the left -Clonus: 1 beat bilaterally -Interosseous wasting: None seen -Grip and release test: Positive -Romberg: Positive -Gait: Slow shuffling -Imbalance with tandem gait: yes  Bilateral shoulder exam: Has some pain with maximal internal and external rotation otherwise no pain through remainder of range of motion  Tinel's at wrist: Negative bilaterally Durkan's: Negative bilaterally  Tinel's at elbow: Negative bilaterally  Imaging: XR of the cervical spine from 05/11/2022 was independently reviewed and interpreted, showing no fracture or dislocation. No evidence of instability. Disc height loss at C5/6 and C6/7. Neutral alignment.    Patient name: Albert Harris Patient MRN: [id number] Date of visit: 05/11/22

## 2022-06-10 ENCOUNTER — Ambulatory Visit: Payer: Medicare Other | Admitting: Orthopedic Surgery

## 2022-06-16 ENCOUNTER — Telehealth: Payer: Self-pay

## 2022-06-16 NOTE — Telephone Encounter (Signed)
Pt's wife, Charlett Nose, called requesting stent information for a MRI to be scheduled. The vm went to office manager phone. Given msg to call pt today.  Reviewed pt's chart, returned call for clarification, two identifiers used. Pt uses Mychart and implant list was copied and pasted into a msg so the pt would have those numbers. Pt's wife asked for information to be given to [place]. Confirmed understanding.  [facility], confirmed pt info. Since they have access to Mychart, walked scheduler through the process of finding implants in chart. She confirmed seeing them and will forward to the appropriate person so that pt can get MRI scheduled. Confirmed understanding.

## 2022-06-25 ENCOUNTER — Ambulatory Visit
Admission: RE | Admit: 2022-06-25 | Discharge: 2022-06-25 | Disposition: A | Discharge status: Home / Self Care | Payer: Medicare Other | Source: Ambulatory Visit | Attending: Orthopedic Surgery | Admitting: Orthopedic Surgery

## 2022-06-25 DIAGNOSIS — M4712 Other spondylosis with myelopathy, cervical region: Secondary | ICD-10-CM

## 2022-06-28 ENCOUNTER — Emergency Department (HOSPITAL_COMMUNITY): Payer: Medicare Other

## 2022-06-28 ENCOUNTER — Other Ambulatory Visit: Payer: Self-pay

## 2022-06-28 ENCOUNTER — Encounter (HOSPITAL_COMMUNITY): Payer: Self-pay | Admitting: Pharmacy Technician

## 2022-06-28 ENCOUNTER — Inpatient Hospital Stay (HOSPITAL_COMMUNITY)
Admission: EM | Admit: 2022-06-28 | Discharge: 2022-07-01 | DRG: 291 | Disposition: A | Discharge status: Home / Self Care | Payer: Medicare Other | Attending: Family Medicine | Admitting: Family Medicine

## 2022-06-28 DIAGNOSIS — F1010 Alcohol abuse, uncomplicated: Secondary | ICD-10-CM | POA: Diagnosis present

## 2022-06-28 DIAGNOSIS — Z800 Family history of malignant neoplasm of digestive organs: Secondary | ICD-10-CM

## 2022-06-28 DIAGNOSIS — I10 Essential (primary) hypertension: Secondary | ICD-10-CM

## 2022-06-28 DIAGNOSIS — D631 Anemia in chronic kidney disease: Secondary | ICD-10-CM | POA: Diagnosis present

## 2022-06-28 DIAGNOSIS — M4622 Osteomyelitis of vertebra, cervical region: Secondary | ICD-10-CM | POA: Diagnosis present

## 2022-06-28 DIAGNOSIS — I132 Hypertensive heart and chronic kidney disease with heart failure and with stage 5 chronic kidney disease, or end stage renal disease: Principal | ICD-10-CM | POA: Diagnosis present

## 2022-06-28 DIAGNOSIS — N2581 Secondary hyperparathyroidism of renal origin: Secondary | ICD-10-CM | POA: Diagnosis present

## 2022-06-28 DIAGNOSIS — Z8673 Personal history of transient ischemic attack (TIA), and cerebral infarction without residual deficits: Secondary | ICD-10-CM

## 2022-06-28 DIAGNOSIS — I5082 Biventricular heart failure: Secondary | ICD-10-CM | POA: Diagnosis present

## 2022-06-28 DIAGNOSIS — M4802 Spinal stenosis, cervical region: Secondary | ICD-10-CM | POA: Diagnosis present

## 2022-06-28 DIAGNOSIS — Z87891 Personal history of nicotine dependence: Secondary | ICD-10-CM

## 2022-06-28 DIAGNOSIS — A0472 Enterocolitis due to Clostridium difficile, not specified as recurrent: Secondary | ICD-10-CM | POA: Diagnosis present

## 2022-06-28 DIAGNOSIS — N186 End stage renal disease: Secondary | ICD-10-CM | POA: Diagnosis present

## 2022-06-28 DIAGNOSIS — I5020 Unspecified systolic (congestive) heart failure: Secondary | ICD-10-CM

## 2022-06-28 DIAGNOSIS — I890 Lymphedema, not elsewhere classified: Secondary | ICD-10-CM

## 2022-06-28 DIAGNOSIS — Z5982 Transportation insecurity: Secondary | ICD-10-CM

## 2022-06-28 DIAGNOSIS — D696 Thrombocytopenia, unspecified: Secondary | ICD-10-CM | POA: Diagnosis present

## 2022-06-28 DIAGNOSIS — M4712 Other spondylosis with myelopathy, cervical region: Secondary | ICD-10-CM | POA: Diagnosis present

## 2022-06-28 DIAGNOSIS — N19 Unspecified kidney failure: Principal | ICD-10-CM

## 2022-06-28 DIAGNOSIS — Z79899 Other long term (current) drug therapy: Secondary | ICD-10-CM

## 2022-06-28 DIAGNOSIS — I5021 Acute systolic (congestive) heart failure: Secondary | ICD-10-CM

## 2022-06-28 DIAGNOSIS — Z8249 Family history of ischemic heart disease and other diseases of the circulatory system: Secondary | ICD-10-CM

## 2022-06-28 DIAGNOSIS — M4642 Discitis, unspecified, cervical region: Secondary | ICD-10-CM | POA: Diagnosis present

## 2022-06-28 DIAGNOSIS — R4701 Aphasia: Secondary | ICD-10-CM | POA: Diagnosis present

## 2022-06-28 DIAGNOSIS — G9341 Metabolic encephalopathy: Secondary | ICD-10-CM | POA: Diagnosis present

## 2022-06-28 DIAGNOSIS — R160 Hepatomegaly, not elsewhere classified: Secondary | ICD-10-CM | POA: Diagnosis present

## 2022-06-28 DIAGNOSIS — F1090 Alcohol use, unspecified, uncomplicated: Secondary | ICD-10-CM | POA: Diagnosis present

## 2022-06-28 DIAGNOSIS — E785 Hyperlipidemia, unspecified: Secondary | ICD-10-CM | POA: Diagnosis present

## 2022-06-28 DIAGNOSIS — K219 Gastro-esophageal reflux disease without esophagitis: Secondary | ICD-10-CM | POA: Diagnosis present

## 2022-06-28 DIAGNOSIS — D539 Nutritional anemia, unspecified: Secondary | ICD-10-CM | POA: Diagnosis present

## 2022-06-28 DIAGNOSIS — K7460 Unspecified cirrhosis of liver: Secondary | ICD-10-CM

## 2022-06-28 DIAGNOSIS — Z760 Encounter for issue of repeat prescription: Secondary | ICD-10-CM

## 2022-06-28 DIAGNOSIS — Z888 Allergy status to other drugs, medicaments and biological substances status: Secondary | ICD-10-CM

## 2022-06-28 DIAGNOSIS — I5022 Chronic systolic (congestive) heart failure: Secondary | ICD-10-CM | POA: Diagnosis present

## 2022-06-28 DIAGNOSIS — R188 Other ascites: Secondary | ICD-10-CM

## 2022-06-28 DIAGNOSIS — Z1152 Encounter for screening for COVID-19: Secondary | ICD-10-CM

## 2022-06-28 DIAGNOSIS — Z720 Tobacco use: Secondary | ICD-10-CM | POA: Diagnosis present

## 2022-06-28 DIAGNOSIS — I739 Peripheral vascular disease, unspecified: Secondary | ICD-10-CM | POA: Diagnosis present

## 2022-06-28 DIAGNOSIS — D689 Coagulation defect, unspecified: Secondary | ICD-10-CM | POA: Diagnosis present

## 2022-06-28 DIAGNOSIS — R197 Diarrhea, unspecified: Secondary | ICD-10-CM

## 2022-06-28 DIAGNOSIS — R278 Other lack of coordination: Secondary | ICD-10-CM | POA: Diagnosis present

## 2022-06-28 DIAGNOSIS — Z992 Dependence on renal dialysis: Secondary | ICD-10-CM

## 2022-06-28 DIAGNOSIS — J4489 Other specified chronic obstructive pulmonary disease: Secondary | ICD-10-CM | POA: Diagnosis present

## 2022-06-28 DIAGNOSIS — Z91158 Patient's noncompliance with renal dialysis for other reason: Secondary | ICD-10-CM

## 2022-06-28 HISTORY — DX: Gastrointestinal hemorrhage, unspecified: K92.2

## 2022-06-28 HISTORY — DX: Atherosclerosis of native arteries of extremities with rest pain, right leg: I70.221

## 2022-06-28 LAB — CBC WITH DIFFERENTIAL/PLATELET
Abs Immature Granulocytes: 0.04 10*3/uL (ref 0.00–0.07)
Basophils Absolute: 0.1 10*3/uL (ref 0.0–0.1)
Basophils Relative: 1 %
Eosinophils Absolute: 0.3 10*3/uL (ref 0.0–0.5)
Eosinophils Relative: 4 %
HCT: 27.6 % — ABNORMAL LOW (ref 39.0–52.0)
Hemoglobin: 8.9 g/dL — ABNORMAL LOW (ref 13.0–17.0)
Immature Granulocytes: 0 %
Lymphocytes Relative: 11 %
Lymphs Abs: 1.1 10*3/uL (ref 0.7–4.0)
MCH: 33.5 pg (ref 26.0–34.0)
MCHC: 32.2 g/dL (ref 30.0–36.0)
MCV: 103.8 fL — ABNORMAL HIGH (ref 80.0–100.0)
Monocytes Absolute: 0.7 10*3/uL (ref 0.1–1.0)
Monocytes Relative: 7 %
Neutro Abs: 7.4 10*3/uL (ref 1.7–7.7)
Neutrophils Relative %: 77 %
Platelets: 144 10*3/uL — ABNORMAL LOW (ref 150–400)
RBC: 2.66 MIL/uL — ABNORMAL LOW (ref 4.22–5.81)
RDW: 15.3 % (ref 11.5–15.5)
WBC: 9.6 10*3/uL (ref 4.0–10.5)
nRBC: 0 % (ref 0.0–0.2)

## 2022-06-28 LAB — COMPREHENSIVE METABOLIC PANEL
ALT: 7 U/L (ref 0–44)
AST: 11 U/L — ABNORMAL LOW (ref 15–41)
Albumin: 3.3 g/dL — ABNORMAL LOW (ref 3.5–5.0)
Alkaline Phosphatase: 76 U/L (ref 38–126)
Anion gap: 23 — ABNORMAL HIGH (ref 5–15)
BUN: 105 mg/dL — ABNORMAL HIGH (ref 8–23)
CO2: 18 mmol/L — ABNORMAL LOW (ref 22–32)
Calcium: 9.5 mg/dL (ref 8.9–10.3)
Chloride: 98 mmol/L (ref 98–111)
Creatinine, Ser: 30.01 mg/dL — ABNORMAL HIGH (ref 0.61–1.24)
GFR, Estimated: 1 mL/min — ABNORMAL LOW (ref >60)
Glucose, Bld: 102 mg/dL — ABNORMAL HIGH (ref 70–99)
Potassium: 3.6 mmol/L (ref 3.5–5.1)
Sodium: 139 mmol/L (ref 135–145)
Total Bilirubin: 0.9 mg/dL (ref 0.3–1.2)
Total Protein: 7.3 g/dL (ref 6.5–8.1)

## 2022-06-28 LAB — HIV ANTIBODY (ROUTINE TESTING W REFLEX): HIV Screen 4th Generation wRfx: NONREACTIVE

## 2022-06-28 LAB — PROTIME-INR
INR: 1.3 — ABNORMAL HIGH (ref 0.8–1.2)
Prothrombin Time: 16.1 seconds — ABNORMAL HIGH (ref 11.4–15.2)

## 2022-06-28 LAB — LIPASE, BLOOD: Lipase: 27 U/L (ref 11–51)

## 2022-06-28 LAB — RESP PANEL BY RT-PCR (RSV, FLU A&B, COVID)  RVPGX2
Influenza A by PCR: NEGATIVE
Influenza B by PCR: NEGATIVE
Resp Syncytial Virus by PCR: NEGATIVE
SARS Coronavirus 2 by RT PCR: NEGATIVE

## 2022-06-28 LAB — MRSA NEXT GEN BY PCR, NASAL: MRSA by PCR Next Gen: DETECTED — AB

## 2022-06-28 LAB — LACTIC ACID, PLASMA: Lactic Acid, Venous: 1.2 mmol/L (ref 0.5–1.9)

## 2022-06-28 LAB — HEPATITIS B SURFACE ANTIGEN: Hepatitis B Surface Ag: NONREACTIVE

## 2022-06-28 LAB — AMMONIA: Ammonia: 22 umol/L (ref 9–35)

## 2022-06-28 MED ORDER — ACETAMINOPHEN 325 MG PO TABS: 650.0000 mg | ORAL_TABLET | Freq: Four times a day (QID) | ORAL | Status: DC | PRN

## 2022-06-28 MED ORDER — LACTULOSE 10 GM/15ML PO SOLN
30.0000 g | Freq: Three times a day (TID) | ORAL | Status: DC
  Administered 2022-06-28: 30 g via ORAL
  Filled 2022-06-28: qty 45

## 2022-06-28 MED ORDER — METOPROLOL SUCCINATE ER 25 MG PO TB24
25.0000 mg | ORAL_TABLET | Freq: Every day | ORAL | Status: DC
  Administered 2022-06-28 – 2022-07-01 (×4): 25 mg via ORAL
  Filled 2022-06-28 (×4): qty 1

## 2022-06-28 MED ORDER — CHLORHEXIDINE GLUCONATE CLOTH 2 % EX PADS
6.0000 | MEDICATED_PAD | Freq: Every day | CUTANEOUS | Status: DC
  Administered 2022-06-29 – 2022-07-01 (×3): 6 via TOPICAL

## 2022-06-28 MED ORDER — AMLODIPINE BESYLATE 10 MG PO TABS
10.0000 mg | ORAL_TABLET | Freq: Every day | ORAL | Status: DC
  Administered 2022-06-28 – 2022-07-01 (×4): 10 mg via ORAL
  Filled 2022-06-28 (×3): qty 1
  Filled 2022-06-28: qty 2

## 2022-06-28 MED ORDER — PANTOPRAZOLE SODIUM 40 MG IV SOLR
40.0000 mg | Freq: Two times a day (BID) | INTRAVENOUS | Status: DC
  Administered 2022-06-28 – 2022-06-30 (×4): 40 mg via INTRAVENOUS
  Filled 2022-06-28 (×4): qty 10

## 2022-06-28 MED ORDER — ACETAMINOPHEN 650 MG RE SUPP: 650.0000 mg | Freq: Four times a day (QID) | RECTAL | Status: DC | PRN

## 2022-06-28 NOTE — Progress Notes (Signed)
FMTS Interim Progress Note  S: Albert Harris patient with Dr. Larae Grooms.  Patient resting comfortably and awaiting transport upstairs to new bed.  He has no new complaints.  O: BP (!) 180/80 (BP Location: Left Arm)   Pulse 79   Temp 97.8 F (36.6 C) (Oral)   Resp 17   Ht 5\' 8"  (1.727 m)   Wt 72.6 kg   SpO2 98%   BMI 24.33 kg/m   Jaundiced, no acute distress, chronically ill-appearing Cardio: Regular rate, regular rhythm, no murmurs on exam Abdominal: Mildly distended, epigastric tenderness  A/P: ESRD: -Due for dialysis tomorrow -Follow-up AM labs -Lokelma dose 3 times daily, will monitor output overnight  Hypertension: Secondary to missed dialysis -BP meds given in the ED -Will spot dose hydralazine for SBP >200  Darci Current, DO 06/28/2022, 8:58 PM PGY-1, [place] pager [phone]

## 2022-06-28 NOTE — Assessment & Plan Note (Addendum)
C. diff antigen and PCR positive, Abx started overnight.  Positive FOBT and lactoferrin.  GI onboard, monitoring.  3 bowel movements in past day. - Fidaxomicin 200 mg PO BID (day 1 of 10)

## 2022-06-28 NOTE — H&P (Addendum)
Hospital Admission History and Physical Service Pager: [phone]  Patient name: Albert Harris Medical record number: [id number] Date of Birth: April 20, 1956 Age: 67 y.o. Gender: male  Primary Care Provider: Kerin Perna, NP Consultants: Nephro  Code Status: Full  Preferred Emergency Contact:   Contact Information     Name Relation Home Work [place] Spouse [phone]  [phone]   [facility] Daughter   [phone]        Chief Complaint: Missed dialysis, neck pain, diarrhea  Assessment and Plan: [name] is a 67 y.o. male presenting with fluid overload (ESRD, missed dialysis for 2 weeks), diarrhea, new jaundice and cirrhosis findings, and back/neck pain (c/w spinal stenosis).   * ESRD (end stage renal disease) on dialysis [facility]) Missed dialysis for 2 weeks due to diarrhea(see below) and transportation needs. Labs notable for elevated BUN, anion gap, low bicarb. Na and K wnl. Satting well on RA, lung exam benign. Differential includes acute on chronic ESRD on dialysis, CHF exac, DVT/PE, and liver failure. ESRD is most likely given missed dialysis for 2 wks and elevated BUN. CHF is considered given hx of HFrEF, will repeat echo. DVT/PE is considered, but less likely given normal HR and resp status. Liver failure is considered given cirrhotic changes on RUQ [place] and jaundice, will workup (see below).  - Admit to [facility] Med Surg w/ attending Dr. McDiarmid - Nephrology consulted for dialysis. Appreciate care. Dialysis access in on R arm. - f/u phos - AM CMP, phos, mg, CBC  HFrEF (heart failure with reduced ejection fraction) ([place]) EF 05/2021 35-40%.  - f/u echo given fluid overload  Lymphedema Hx of lymphedema in R LE from cellulitis. On exam, R LE 2+ edema, red, and has chronic thickened skin changes. Nontender. Given normal HR and resp status, low c/f DVT/PE at this time - consider DVT US if resp status worsens or tachycardia develops  Cirrhosis of  liver (HCC) RUQ U/S noted cirrhotic changes, moderated ascites, and gallbladder wall thickening (no stones). Does have hx of alcohol use disorder (drinking since age 6, drank 6pack of beer daily, stopped drinking 5 years ago). Exam notable for RUQ tenderness and hepatomegaly. Labs notable for PT/INR mildly increased, albumin low, Plt low, AST low, ALT wnl, T bili wnl.  - Start lactulose - Protonix 40 IV BID - will consult GI - f/u Hepatitis labs - AM CBC, CMP, PT/INR  Diarrhea Has 5-7wk hx of loose stools. Reports food goes through him and looks same as when it goes in. Denies bloody stools or melena. Differential includes infectious gastro, IBS, IBD, and GI bleed. Infectious gastro is considered, but the prolong time course is c/f potential - f/u FOBT - monitor stool for GI bleed  Spinal stenosis of cervical region Has 5-7wk hx of back/neck pain after a fall. Saw Ortho 05/11/22, diagnosed w/ myelopathy, started gabapentin. MRI 06/25/22 showed spinal stenosis of C3-C4 and acute spondyloarthropathy of C5-C6. On exam, tender to palpation paraspinally in R trapezius region. Spine extension worsens pain, and flexion improves. - given ERSD, will hold gabapentin - Tylenol prn - PT/OT  Hypertension BP elevated. - Restart home amlodipine - suspect BP will improve w/ dialysis   Chronic Conditions: HFrEF: Cont metop COPD: not taking home albuterol prn  FEN/GI: Heart healthy VTE Prophylaxis: SCD's. Bleed risk.  Disposition: Med Surg  History of Present Illness:  Albert Harris is a 67 y.o. male presenting with neck pain, diarrhea, and missing dialysis. Patient states  he fell and hurt his neck about 5-7 weeks ago and his neck started hurting. He was seen by ortho 05/11/22, diagnosed w/ myelopathy, started on gabapentin and told to get MRI.   Around the same time (5-7 wks ago), he also started having loose stools. Reports that any time he ate or drink anything, he would immediately have to poop.  He states that when he drank cranberry juice, it came out still looking like cranberry juice. He's had 1 episode of emesis too, nonbloody. His diarrhea prevented him from being able to do dialysis because he couldn't stay in the chair long enough. He also reports transportation issues preventing him from reaching dialysis. Feels his last session was after Christmas but before New years. Does make urine but states not much.   A few days ago, he also reports onset of diffuse pruritus. He is prescribed atarax prn for itching on dialysis days.  Denies SOB, chest pain, fever during this time.  States he has not been told he has liver issues. Denies current alcohol use. States he hasn't drank for 5 years but states he was drinking since the age of 59. Would drink at least a 6 pack a day.   In the ED, arrived w/ HTN. CMP showed very elevated BUN and Cr, low bicarb, and normal Na and K. Anion gap elevated. CBC showed acute drop Hgb from baseline. RUQ US showed cirrhotic liver morphology, moderate ascites, and gall bladder wall thickening. CXR showed no acute changes. EKG showed NSR, no ST changes.   Pertinent Past Medical History: HFrEF HTN ESRD on dialysis COPD Hx of stroke PAD Hx of lymphedema of R leg  Remainder reviewed in history tab.   Pertinent Past Surgical History: BL LE femoral bypass  Remainder reviewed in history tab.   Pertinent Social History: Tobacco use: Former- quit about 4 years ago Denies current alcohol use. States he hasn't drank for 5 years but states he was drinking since the age of 3. Would drink at least a 6 pack a day.  Other Substance use: no Lives with wife  Pertinent Family History: Mother: HTN Father: Colon cancer  Remainder reviewed in history tab.   Important Outpatient Medications: Amlodipine Metoprolol  Remainder reviewed in medication history.   Objective: BP (!) 187/93   Pulse 85   Temp 97.9 F (36.6 C) (Oral)   Resp 19   Ht 5\' 8"  (1.727  m)   Wt 72.6 kg   SpO2 94%   BMI 24.33 kg/m  Exam: General: Elderly old man laying in bed. Speaking in full sentences. NAD. Jaundiced skin. Eyes: mild scleral icterus HEENT: NCAT. MMM.  Cardiovascular: RRR Respiratory: CTAB, no crackles or wheezing. Good air movement. Normal WOB on RA. Gastrointestinal: Tenderness to palpation in RUQ. Liver edge palpated ~2-3cm below rib. MSK: Tenderness to palpation of paraspinal R trapezius region. No point tenderness over midline spine. Pain worsens with neck extension and raising arm above head. Pain is alleviated with bending forward. Ext: Erythema and 2+ pitting edema of R LE. Trace edema of L LE. BL LE nontender. Neuro: A&Ox3. PERRLA. Asterixis present.  Labs:  CBC BMET  Recent Labs  Lab 06/28/22 1340  WBC 9.6  HGB 8.9*  HCT 27.6*  PLT 144*   Recent Labs  Lab 06/28/22 1340  NA 139  K 3.6  CL 98  CO2 18*  BUN 105*  CREATININE 30.01*  GLUCOSE 102*  CALCIUM 9.5       EKG: My own interpretation (  not copied from electronic read) NSR, no ST changes.     Imaging Studies Performed:  CXR Impression from Radiologist: Cardiomegaly. No acute cardiopulmonary process.    My Interpretation: cardiomegaly, no acute changes.    Arlyce Dice, MD 06/28/2022, 7:53 PM PGY-1, [place] Intern pager: [phone], text pages welcome Secure chat group [facility] Upper-Level Resident Addendum   I have independently interviewed and examined the patient. I have discussed the above with the original author and agree with their documentation. My edits for correction/addition/clarification are included. Please see also any attending notes.   Sonia Side, D.O. PGY-3, [facility] 06/28/2022 10:17 PM  [place] Service pager: [phone] (text pages welcome through [facility])

## 2022-06-28 NOTE — Assessment & Plan Note (Addendum)
Received second round dialysis yesterday.  No on TTS schedule per nephrology.  -1.3L net I/O in past 24 hours (1.9 L was removed at dialysis). - AM CMP, Phos, Mg, CBC - Nephrology consulted for dialysis. Appreciate recs:  - Renal diet  - TTS schedule for dialysis after today  - VTE PPx per primary team  - ESA for likely anemia of chronic disease  - Outpatient orders copied to d/c summary

## 2022-06-28 NOTE — ED Notes (Signed)
ED TO INPATIENT HANDOFF REPORT  ED Nurse Name and Phone #: Luiz Iron s, [phone]  S Name/Age/Gender Albert Harris 67 y.o. male Room/Bed: 028C/028C  Code Status   Code Status: Full Code  Home/SNF/Other Home Patient oriented to: self, place, time, and situation Is this baseline? Yes   Triage Complete: Triage complete  Chief Complaint ESRD (end stage renal disease) on dialysis ([place]) [N18.6, Z99.2]  Triage Note Pt here with reports of neck pain and had an MRI done. Was told he may have an infection in his spine. Pt also states last dialysis was approx 2 weeks ago. Pt states hasn't gone due to having diarrhea. Pt jaundiced, new since christmas.    Allergies Allergies  Allergen Reactions   Lidocaine-Prilocaine Rash    Caused red marks up and down arm    Level of Care/Admitting Diagnosis ED Disposition     ED Disposition  Admit   Condition  --   Comment  Hospital Area: [facility] [[id number]]  Level of Care: Med-Surg [16]  May place patient in observation at [facility] or [facility] if equivalent level of care is available:: No  Covid Evaluation: Confirmed COVID Negative  Diagnosis: ESRD (end stage renal disease) on dialysis [facility]) [[id number]]  Admitting Physician: Rickey Primus  Attending Physician: Wendy Poet, TODD D [[id number]]          B Medical/Surgery History Past Medical History:  Diagnosis Date   Anemia    Asthma    Carotid stenosis 09/12/2019   CHF (congestive heart failure) ([place])    Claudication in peripheral vascular disease ([place]) 67/34/1939   Complication of anesthesia    " i HAVE A HARD TIME WAKING UP " (only one time)   COPD (chronic obstructive pulmonary disease) ([place])    COVID-19    positive on  06/16/20   ESRD on hemodialysis ([place])    t, th. sat dialysis   GERD (gastroesophageal reflux disease)    HLD (hyperlipidemia)    Hypertension    Peripheral vascular disease ([place])    PFO with atrial septal aneurysm    by echo  11/2017   Pneumonia    Stroke [facility])    [name] (transient ischemic attack) 11/2017   Tobacco abuse    quit 07/17/19   Past Surgical History:  Procedure Laterality Date   ABDOMINAL AORTOGRAM W/LOWER EXTREMITY N/A 06/21/2020   Procedure: ABDOMINAL AORTOGRAM W/LOWER EXTREMITY;  Surgeon: Angelia Mould, MD;  Location: [facility];  Service: Cardiovascular;  Laterality: N/A;   ABDOMINAL AORTOGRAM W/LOWER EXTREMITY Right 08/29/2021   Procedure: ABDOMINAL AORTOGRAM W/LOWER EXTREMITY;  Surgeon: Cherre Robins, MD;  Location: [place] [facility];  Service: Cardiovascular;  Laterality: Right;   APPLICATION OF WOUND VAC Right 09/21/2021   Procedure: APPLICATION OF WOUND VAC;  Surgeon: Angelia Mould, MD;  Location: [place];  Service: Vascular;  Laterality: Right;   AV FISTULA PLACEMENT Left 03/28/2020   Procedure: ARTERIOVENOUS (AV) FISTULA CREATION LEFT;  Surgeon: [name], MD;  Location: [place];  Service: Vascular;  Laterality: Left;   AV FISTULA PLACEMENT Right 09/23/2020   Procedure: RIGHT Arm ARTERIOVENOUS GRAFT CREATION.;  Surgeon: Angelia Mould, MD;  Location: [place];  Service: Vascular;  Laterality: Right;   COLONOSCOPY     COLONOSCOPY WITH PROPOFOL N/A 06/27/2020   Procedure: COLONOSCOPY WITH PROPOFOL;  Surgeon: Carol Ada, MD;  Location: [facility];  Service: Endoscopy;  Laterality: N/A;   DRAINAGE AND CLOSURE OF LYMPHOCELE Right  09/21/2021   Procedure: EVACUATION OF LYMPHOCELE RIGHT GROIN;  Surgeon: Angelia Mould, MD;  Location: [facility];  Service: Vascular;  Laterality: Right;   ENDARTERECTOMY FEMORAL Right 09/05/2021   Procedure: ENDARTERECTOMY FEMORAL;  Surgeon: Angelia Mould, MD;  Location: [facility];  Service: Vascular;  Laterality: Right;   FEMORAL-POPLITEAL BYPASS GRAFT Right 09/05/2021   Procedure: RIGHT FEMORAL-POPLITEAL ARTERY BYPASS WITH Spaphenous Vein;  Surgeon: Angelia Mould, MD;  Location: [facility];  Service: Vascular;   Laterality: Right;   [place]  06/27/2020   Procedure: HEMOSTASIS CLIP PLACEMENT;  Surgeon: Carol Ada, MD;  Location: [facility];  Service: Endoscopy;;   INSERTION OF DIALYSIS CATHETER Right 06/24/2020   Procedure: INSERTION OF TUNNELED  DIALYSIS CATHETER; Removal of temporary dialysis catheter right neck;  Surgeon: Angelia Mould, MD;  Location: [facility];  Service: Vascular;  Laterality: Right;   LIGATION OF ARTERIOVENOUS  FISTULA Left 07/24/2020   Procedure: LIGATION OF LEFT BRACHIOCEPHALIC ARTERIOVENOUS  FISTULA;  Surgeon: [name], MD;  Location: [place];  Service: Vascular;  Laterality: Left;   PERIPHERAL VASCULAR INTERVENTION Right 06/21/2020   Procedure: PERIPHERAL VASCULAR INTERVENTION;  Surgeon: Angelia Mould, MD;  Location: [place] [facility];  Service: Cardiovascular;  Laterality: Right;   TEE WITHOUT CARDIOVERSION N/A 05/13/2021   Procedure: TRANSESOPHAGEAL ECHOCARDIOGRAM (TEE);  Surgeon: Donato Heinz, MD;  Location[facility] ENDOSCOPY;  Service: Cardiovascular;  Laterality: N/A;   VEIN HARVEST Right 09/05/2021   Procedure: RIGHT Saphenous VEIN HARVEST;  Surgeon: Angelia Mould, MD;  Location: [facility];  Service: Vascular;  Laterality: Right;     A IV Location/Drains/Wounds Patient Lines/Drains/Airways Status     Active Line/Drains/Airways     Name Placement date Placement time Site Days   Peripheral IV 06/28/22 22 G 1" Left;Posterior Forearm 06/28/22  1457  Forearm  less than 1   Fistula / Graft Right Forearm --  --  Forearm  --   Negative Pressure Wound Therapy Thigh Anterior;Proximal;Right 09/21/21  0755  --  280   Wound / Incision (Open or Dehisced) 08/28/21 Other (Comment) Toe (Comment  which one) Anterior;Right blister to toe 08/28/21  1800  Toe (Comment  which one)  304            Intake/Output Last 24 hours No intake or output data in the 24 hours ending 06/28/22 1913  Labs/Imaging Results  for orders placed or performed during the hospital encounter of 06/28/22 (from the past 48 hour(s))  Comprehensive metabolic panel     Status: Abnormal   Collection Time: 06/28/22  1:40 PM  Result Value Ref Range   Sodium 139 135 - 145 mmol/L   Potassium 3.6 3.5 - 5.1 mmol/L   Chloride 98 98 - 111 mmol/L   CO2 18 (L) 22 - 32 mmol/L   Glucose, Bld 102 (H) 70 - 99 mg/dL    Comment: Glucose reference range applies only to samples taken after fasting for at least 8 hours.   BUN 105 (H) 8 - 23 mg/dL   Creatinine, Ser 30.01 (H) 0.61 - 1.24 mg/dL    Comment: RESULT CONFIRMED BY MANUAL DILUTION   Calcium 9.5 8.9 - 10.3 mg/dL   Total Protein 7.3 6.5 - 8.1 g/dL   Albumin 3.3 (L) 3.5 - 5.0 g/dL   AST 11 (L) 15 - 41 U/L   ALT 7 0 - 44 U/L   Alkaline Phosphatase 76 38 - 126 U/L  Total Bilirubin 0.9 0.3 - 1.2 mg/dL   GFR, Estimated 1 (L) >60 mL/min    Comment: (NOTE) Calculated using the CKD-EPI Creatinine Equation (2021)    Anion gap 23 (H) 5 - 15    Comment: ELECTROLYTES REPEATED TO VERIFY Performed at [facility] [address], [place], [place] [address]   CBC with Differential     Status: Abnormal   Collection Time: 06/28/22  1:40 PM  Result Value Ref Range   WBC 9.6 4.0 - 10.5 K/uL   RBC 2.66 (L) 4.22 - 5.81 MIL/uL   Hemoglobin 8.9 (L) 13.0 - 17.0 g/dL   HCT 27.6 (L) 39.0 - 52.0 %   MCV 103.8 (H) 80.0 - 100.0 fL   MCH 33.5 26.0 - 34.0 pg   MCHC 32.2 30.0 - 36.0 g/dL   RDW 15.3 11.5 - 15.5 %   Platelets 144 (L) 150 - 400 K/uL   nRBC 0.0 0.0 - 0.2 %   Neutrophils Relative % 77 %   Neutro Abs 7.4 1.7 - 7.7 K/uL   Lymphocytes Relative 11 %   Lymphs Abs 1.1 0.7 - 4.0 K/uL   Monocytes Relative 7 %   Monocytes Absolute 0.7 0.1 - 1.0 K/uL   Eosinophils Relative 4 %   Eosinophils Absolute 0.3 0.0 - 0.5 K/uL   Basophils Relative 1 %   Basophils Absolute 0.1 0.0 - 0.1 K/uL   Immature Granulocytes 0 %   Abs Immature Granulocytes 0.04 0.00 - 0.07 K/uL    Comment: Performed at  [facility] [address], [place], [place] [address]  Ammonia     Status: None   Collection Time: 06/28/22  1:52 PM  Result Value Ref Range   Ammonia 22 9 - 35 umol/L    Comment: Performed at [facility], [place] [address] [place], [place] [address]  Lipase, blood     Status: None   Collection Time: 06/28/22  1:52 PM  Result Value Ref Range   Lipase 27 11 - 51 U/L    Comment: Performed at [facility] [address], [place], [place] [address]  Lactic acid, plasma     Status: None   Collection Time: 06/28/22  1:58 PM  Result Value Ref Range   Lactic Acid, Venous 1.2 0.5 - 1.9 mmol/L    Comment: Performed at [facility] [address] [place], [place] [address]  Protime-INR     Status: Abnormal   Collection Time: 06/28/22  3:00 PM  Result Value Ref Range   Prothrombin Time 16.1 (H) 11.4 - 15.2 seconds   INR 1.3 (H) 0.8 - 1.2    Comment: (NOTE) INR goal varies based on device and disease states. Performed at [facility], [place] [address] [place], [place] [address]   Resp panel by RT-PCR (RSV, Flu A&B, Covid) Anterior Nasal Swab     Status: None   Collection Time: 06/28/22  3:03 PM   Specimen: Anterior Nasal Swab  Result Value Ref Range   SARS Coronavirus 2 by RT PCR NEGATIVE NEGATIVE    Comment: (NOTE) SARS-CoV-2 target nucleic acids are NOT DETECTED.  The SARS-CoV-2 RNA is generally detectable in upper respiratory specimens during the acute phase of infection. The lowest concentration of SARS-CoV-2 viral copies this assay can detect is 138 copies/mL. A negative result does not preclude SARS-Cov-2 infection and should not be used as the sole basis for treatment or other patient management  decisions. A negative result may occur with  improper specimen collection/handling, submission of specimen other than nasopharyngeal swab, presence of viral mutation(s) within the areas targeted by this assay, and inadequate number of viral copies(<138  copies/mL). A negative result must be combined with clinical observations, patient history, and epidemiological information. The expected result is Negative.  Fact Sheet for Patients:  EntrepreneurPulse.com.au  Fact Sheet for Healthcare Providers:  IncredibleEmployment.be  This test is no t yet approved or cleared by the [place] FDA and  has been authorized for detection and/or diagnosis of SARS-CoV-2 by FDA under an Emergency Use Authorization (EUA). This EUA will remain  in effect (meaning this test can be used) for the duration of the COVID-19 declaration under Section 564(b)(1) of the Act, 21 U.S.C.section 360bbb-3(b)(1), unless the authorization is terminated  or revoked sooner.       Influenza A by PCR NEGATIVE NEGATIVE   Influenza B by PCR NEGATIVE NEGATIVE    Comment: (NOTE) The Xpert Xpress SARS-CoV-2/FLU/RSV plus assay is intended as an aid in the diagnosis of influenza from Nasopharyngeal swab specimens and should not be used as a sole basis for treatment. Nasal washings and aspirates are unacceptable for Xpert Xpress SARS-CoV-2/FLU/RSV testing.  Fact Sheet for Patients: EntrepreneurPulse.com.au  Fact Sheet for Healthcare Providers: IncredibleEmployment.be  This test is not yet approved or cleared by the [place] FDA and has been authorized for detection and/or diagnosis of SARS-CoV-2 by FDA under an Emergency Use Authorization (EUA). This EUA will remain in effect (meaning this test can be used) for the duration of the COVID-19 declaration under Section 564(b)(1) of the Act, 21 U.S.C. section 360bbb-3(b)(1), unless the authorization is terminated or revoked.     Resp Syncytial Virus by PCR NEGATIVE NEGATIVE    Comment: (NOTE) Fact Sheet for Patients: EntrepreneurPulse.com.au  Fact Sheet for Healthcare  Providers: IncredibleEmployment.be  This test is not yet approved or cleared by the [place] FDA and has been authorized for detection and/or diagnosis of SARS-CoV-2 by FDA under an Emergency Use Authorization (EUA). This EUA will remain in effect (meaning this test can be used) for the duration of the COVID-19 declaration under Section 564(b)(1) of the Act, 21 U.S.C. section 360bbb-3(b)(1), unless the authorization is terminated or revoked.  Performed at [facility], [place] [address] [place], [place] [address]   Hepatitis B surface antigen     Status: None   Collection Time: 06/28/22  4:40 PM  Result Value Ref Range   Hepatitis B Surface Ag NON REACTIVE NON REACTIVE    Comment: Performed at [facility] [address], [place], [place] [address]   US Abdomen Limited RUQ (LIVER/GB)  Result Date: 06/28/2022 CLINICAL DATA:  Jaundice EXAM: ULTRASOUND ABDOMEN LIMITED RIGHT UPPER QUADRANT COMPARISON:  CT abdomen and pelvis 01/01/2022 FINDINGS: Gallbladder: No gallstones. There is gallbladder wall thickening measuring up to 3.8 mm. No sonographic Murphy sign noted by sonographer. Common bile duct: Diameter: 4 mm Liver: No focal lesion identified. There is nodular liver contour. There is increased in parenchymal echogenicity. Portal vein is patent on color Doppler imaging with normal direction of blood flow towards the liver. Moderate amount of ascites in the right upper quadrant. IMPRESSION: 1. Cirrhotic liver morphology. 2. Moderate amount of ascites in the right upper quadrant. 3. Gallbladder wall thickening measuring up to 3.8 mm. No gallstones or sonographic Murphy sign. Electronically Signed   By: Ronney Asters M.D.   On: 06/28/2022 15:14   DG Chest  Portable 1 View  Result Date: 06/28/2022 CLINICAL DATA:  Weakness EXAM: PORTABLE CHEST 1 VIEW COMPARISON:  Chest radiograph 12/31/2021 FINDINGS: Monitoring leads overlie the patient. Stable cardiomegaly. Aortic  atherosclerosis. No large area pulmonary consolidation. No pleural effusion or pneumothorax. Thoracic spine degenerative changes. IMPRESSION: Cardiomegaly. No acute cardiopulmonary process. Electronically Signed   By: Lovey Newcomer M.D.   On: 06/28/2022 15:01    Pending Labs Unresulted Labs (From admission, onward)     Start     Ordered   06/29/22 0500  Comprehensive metabolic panel  Tomorrow morning,   R        06/28/22 1840   06/29/22 0500  CBC  Tomorrow morning,   R        06/28/22 1840   06/29/22 0500  Protime-INR  Tomorrow morning,   R        06/28/22 1840   06/29/22 0500  Phosphorus  Tomorrow morning,   R        06/28/22 1839   06/29/22 0500  Magnesium  Tomorrow morning,   R        06/28/22 1852   06/28/22 1842  HIV Antibody (routine testing w rflx)  (HIV Antibody (Routine testing w reflex) panel)  Tomorrow morning,   R        06/28/22 1841   06/28/22 1613  Hepatitis B surface antibody,quantitative  (New Admission [facility] (Hepatitis B))  Once,   URGENT        06/28/22 1615   06/28/22 1358  Culture, blood (single)  ONCE - STAT,   STAT        06/28/22 1357   Unscheduled  Occult blood card to lab, stool  As needed,   R        06/28/22 1840   Signed and Held  Renal function panel  Once,   R        Signed and Held   Signed and Held  CBC  Once,   R        Signed and Held            Vitals/Pain Today's Vitals   06/28/22 1741 06/28/22 1800 06/28/22 1830 06/28/22 1900  BP:  (!) 199/96 (!) 193/105 (!) 187/93  Pulse:  83 87 85  Resp:  (!) 23 (!) 32 19  Temp: 97.9 F (36.6 C)     TempSrc: Oral     SpO2:  98% 97% 94%  Weight:      Height:      PainSc:        Isolation Precautions No active isolations  Medications Medications  Chlorhexidine Gluconate Cloth 2 % PADS 6 each (has no administration in time range)  acetaminophen (TYLENOL) tablet 650 mg (has no administration in time range)    Or  acetaminophen (TYLENOL) suppository 650 mg (has no administration in time  range)  amLODipine (NORVASC) tablet 10 mg (has no administration in time range)  metoprolol succinate (TOPROL-XL) 24 hr tablet 25 mg (has no administration in time range)  pantoprazole (PROTONIX) injection 40 mg (has no administration in time range)    Mobility walks     Focused Assessments Returned for c/o MRI results and possible infection in neck. Also states he has not gone to dialysis for 2+ weeks due to diarrhea/feeling unwell.    R Recommendations: See Admitting Provider Note  Report given to:   Additional Notes: n/a

## 2022-06-28 NOTE — Progress Notes (Signed)
Provider on call notified of patient having positive MRSA. Providers stated no ABX will be started at this time.

## 2022-06-28 NOTE — Assessment & Plan Note (Addendum)
Hx of lymphedema in R LE from cellulitis.  Continues to have significant RLE edema, but is chronic with low concern for DVT at this time. - Consider DVT US if resp status worsens or tachycardia develops

## 2022-06-28 NOTE — Assessment & Plan Note (Addendum)
GI consulted, have provided recommendations.  Believe patient is encephalopathic given asterixis and expressive aphasia.  Considering cirrhosis d/t alcohol use, heart failure, or autoimmune sources.  Hepatitis labs pending, appears less likely. - Protonix 40 IV BID - AM CBC, CMP - GI consulted, signed off, appreciate recs  - f/u ANA, AMA  - Diagnostic paracentesis showed cardiac ascites, will see cardiology outpatient  - Rifaximin 550 mg PO BID, hold lactulose

## 2022-06-28 NOTE — Assessment & Plan Note (Addendum)
Has 5-7wk hx of back/neck pain after a fall, follows with ortho and has myelopathy Dx.  Remains tender to palpation in cervical paraspinal region, R>L.  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  OT signed off, no services needed. - Given ERSD, holding gabapentin - Tylenol PRN - PT evaluation - f/u with Ortho who ordered 1/18 MRI C spine concerning findings

## 2022-06-28 NOTE — ED Provider Notes (Signed)
Accepted handoff at shift change from [facility]. Please see prior provider note for more detail.   Briefly: Patient is 67 y.o.   DDX: concern for Missed dialysis for 2 weeks, he is overtly jaundiced, RUQ noted on exam, neck pain atraumatically which shows concern for osteomyelitis (?) vs spondyloarthropathy of ESRD  Plan: elucidate new jaundice Iv antibiotics for osteo most likely Likely to need dialysis Will need admission  Does not make urine at this time -- primary CC was for neck pain and missed dialysis  Clinical course: I independently interpreted right upper quadrant ultrasound which shows gallbladder wall thickening, cirrhotic liver, and right upper quadrant ascites, but with no evidence of acute cholecystitis, cholangitis, or other stone pathology.  I agree with the radiologist interpretation.  Chest x-ray shows cardiomegaly without pleural effusion or other fluid overload.  I independently reviewed labwork including CBC with differential which shows slight worsening of anemia, hemoglobin 8.9 today from baseline of around 10 5 months ago, likely secondary to worsening of chronic disease, with no evidence of acute GI bleed, no dark tarry stools.  CMP is notable for significant uremia, BUN 105, creatinine 30.  Patient notably without any potassium dysfunction, or abnormal bilirubin, or transaminitis.  Patient without lactic acidosis, hyperammonemia, unremarkable lipase.  Overall patient with clinical gestalt suspicious for overt uremia causing weakness, diarrhea, additionally he has MRI findings as described in previous provider note which will need to be investigated not emergently.  Consulted with nephrology who will put this patient on list for dialysis.  I requested consult with hospitalist, spoke with family medicine team, Dr. Roosevelt Locks who agrees to admission at this time.  RISR  EDTHIS     Dorien Chihuahua 06/28/22 1710    Hayden Rasmussen, MD 06/29/22  1041

## 2022-06-28 NOTE — ED Provider Notes (Signed)
[facility] Provider Note   CSN: [id number] Arrival date & time: 06/28/22  1327     History  Chief Complaint  Patient presents with   Missed Dialysis    Albert Harris is a 67 y.o. male.  HPI   This is a 66 year old male with complicated medical history including CHF, COPD, hypertension, hyperlipidemia, tobacco abuse, end-stage renal dialysis presenting to the emergency department due to multiple concerns.  1-patient has missed dialysis for at least 2 weeks, he is unsure the last time he went.  2-patient has been feeling short of breath and feels volume overloaded.  He has been having upper abdominal pain worse to the right upper quadrant as well as diarrhea over the past few days.  No vomiting, no chest pain.  He denies any history of jaundice and he is unsure when he became yellow but his skin has a yellowish hue currently.  3- patient has been having neck pain which is atraumatic in nature.  He is followed by Ortho care, had an MRI of his cervical spine which was concerning for possible osteomyelitis.  The pain is constant, is worse with movement.  It radiates to his right upper extremities bilaterally.  He is not having any vision changes, fevers denies any direct injury to the neck.  Narrative & Impression 06/25/22  CLINICAL DATA:  Myelopathy, chronic. Cervical region neck pain over the last 2 months. History of falls. Right-sided weakness.   EXAM: MRI CERVICAL SPINE WITHOUT CONTRAST   IMPRESSION: 1. Acute/Active spondyloarthropathy at the C5-6 level that could be due to infectious discitis osteomyelitis or spondyloarthropathy of ESRD/hemodialysis. Abnormal marrow signal throughout the C5 and C6 vertebral bodies and progressive irregularity of the endplates. Endplate osteophytes and protruding disc material. Severe spinal stenosis with AP diameter of the canal as narrow as 5 mm. Effacement of the subarachnoid space and  deformity of the cord. Possible abnormal T2 signal within the cord at this level. Bilateral foraminal stenosis could affect either C6 nerve. Abnormal edema throughout the retropharyngeal/prevertebral region which can be seen with both sterile and infectious etiologies. Correlation with clinical and laboratory presentation would be useful. Postcontrast imaging could be performed but might not be able to differentiate between these 2 processes. 2. C3-4: Spinal stenosis with AP diameter of the canal as narrow as 5.6 mm. Effacement of the subarachnoid space and some deformity of the cord. Possible abnormal T2 signal within the cord. Bilateral foraminal stenosis. 3. C6-7: Spondylosis with narrowing of the subarachnoid space but no actual cord compression. Bilateral foraminal narrowing.        Home Medications Prior to Admission medications   Medication Sig Start Date End Date Taking? Authorizing Provider  acetaminophen (TYLENOL) 500 MG tablet Take 1,000 mg by mouth daily as needed for mild pain.    [provider]  albuterol (VENTOLIN HFA) 108 (90 Base) MCG/ACT inhaler INHALE 1-2 PUFFS INTO THE LUNGS EVERY 6 (SIX) HOURS AS NEEDED FOR WHEEZING OR SHORTNESS OF BREATH. 07/02/20 02/14/22  Maness, Arnette Norris, MD  amLODipine (NORVASC) 10 MG tablet Take 1 tablet (10 mg total) by mouth daily. 05/07/22   Kerin Perna, NP  aspirin EC 81 MG EC tablet Take 1 tablet (81 mg total) by mouth daily. Swallow whole. 08/30/21   Lacinda Axon, MD  calcium acetate (PHOSLO) 667 MG capsule Take 2 capsule by mouth three times a day with meals 06/17/21     gabapentin (NEURONTIN) 100 MG capsule Take 1  capsule (100 mg total) by mouth 3 (three) times daily. 05/11/22 06/10/22  Callie Fielding, MD  HYDROcodone-acetaminophen (NORCO/VICODIN) 5-325 MG tablet Take 1 tablet by mouth every 6 (six) hours as needed (pain). 05/01/22   Barrett Henle, MD  methocarbamol (ROBAXIN) 500 MG tablet Take 1 tablet (500 mg  total) by mouth 2 (two) times daily as needed for muscle spasms. 05/07/22   Kerin Perna, NP  metoprolol succinate (TOPROL-XL) 25 MG 24 hr tablet Take 1 tablet (25 mg total) by mouth daily. 12/26/21   Kerin Perna, NP      Allergies    Lidocaine-prilocaine    Review of Systems   Review of Systems  Physical Exam Updated Vital Signs BP (!) 195/94   Pulse 86   Temp 97.6 F (36.4 C)   Resp 20   Ht 5\' 8"  (1.727 m)   Wt 72.6 kg   SpO2 96%   BMI 24.33 kg/m  Physical Exam Vitals and nursing note reviewed. Exam conducted with a chaperone present.  Constitutional:      Appearance: Normal appearance. He is ill-appearing.  HENT:     Head: Normocephalic and atraumatic.  Eyes:     General: Scleral icterus present.        Right eye: No discharge.        Left eye: No discharge.     Extraocular Movements: Extraocular movements intact.     Pupils: Pupils are equal, round, and reactive to light.  Cardiovascular:     Rate and Rhythm: Normal rate and regular rhythm.     Pulses: Normal pulses.     Heart sounds: Normal heart sounds.     No friction rub. No gallop.  Pulmonary:     Effort: Pulmonary effort is normal. No respiratory distress.     Breath sounds: Normal breath sounds.  Abdominal:     General: Abdomen is flat. Bowel sounds are normal. There is distension.     Palpations: Abdomen is soft.     Tenderness: There is abdominal tenderness.     Comments: Right upper quadrant tenderness  Musculoskeletal:     Cervical back: Tenderness present.  Skin:    General: Skin is warm and dry.     Capillary Refill: Capillary refill takes less than 2 seconds.     Coloration: Skin is jaundiced.     Comments: AV fistula right upper extremity palpable thrill  Neurological:     Mental Status: He is alert. Mental status is at baseline.     Coordination: Coordination normal.     Comments: Cranial nerves II through XII are grossly intact.  Follows commands, upper and lower extremity  strength symmetric bilaterally     ED Results / Procedures / Treatments   Labs (all labs ordered are listed, but only abnormal results are displayed) Labs Reviewed  CBC WITH DIFFERENTIAL/PLATELET - Abnormal; Notable for the following components:      Result Value   RBC 2.66 (*)    Hemoglobin 8.9 (*)    HCT 27.6 (*)    MCV 103.8 (*)    Platelets 144 (*)    All other components within normal limits  CULTURE, BLOOD (SINGLE)  RESP PANEL BY RT-PCR (RSV, FLU A&B, COVID)  [id number]  COMPREHENSIVE METABOLIC PANEL  AMMONIA  LIPASE, BLOOD  LACTIC ACID, PLASMA  LACTIC ACID, PLASMA  PROTIME-INR    EKG EKG Interpretation  Date/Time:  Sunday June 28 2022 14:39:35 EST Ventricular Rate:  81 PR Interval:  158  QRS Duration: 90 QT Interval:  422 QTC Calculation: 490 R Axis:   13 Text Interpretation: Sinus rhythm Probable left atrial enlargement Left ventricular hypertrophy Abnormal T, consider ischemia, lateral leads No significant change since earlier today Confirmed by Aletta Edouard [phone]) on 06/28/2022 3:00:05 PM  Radiology US Abdomen Limited RUQ (LIVER/GB)  Result Date: 06/28/2022 CLINICAL DATA:  Jaundice EXAM: ULTRASOUND ABDOMEN LIMITED RIGHT UPPER QUADRANT COMPARISON:  CT abdomen and pelvis 01/01/2022 FINDINGS: Gallbladder: No gallstones. There is gallbladder wall thickening measuring up to 3.8 mm. No sonographic Murphy sign noted by sonographer. Common bile duct: Diameter: 4 mm Liver: No focal lesion identified. There is nodular liver contour. There is increased in parenchymal echogenicity. Portal vein is patent on color Doppler imaging with normal direction of blood flow towards the liver. Moderate amount of ascites in the right upper quadrant. IMPRESSION: 1. Cirrhotic liver morphology. 2. Moderate amount of ascites in the right upper quadrant. 3. Gallbladder wall thickening measuring up to 3.8 mm. No gallstones or sonographic Murphy sign. Electronically Signed   By: Ronney Asters  M.D.   On: 06/28/2022 15:14   DG Chest Portable 1 View  Result Date: 06/28/2022 CLINICAL DATA:  Weakness EXAM: PORTABLE CHEST 1 VIEW COMPARISON:  Chest radiograph 12/31/2021 FINDINGS: Monitoring leads overlie the patient. Stable cardiomegaly. Aortic atherosclerosis. No large area pulmonary consolidation. No pleural effusion or pneumothorax. Thoracic spine degenerative changes. IMPRESSION: Cardiomegaly. No acute cardiopulmonary process. Electronically Signed   By: Lovey Newcomer M.D.   On: 06/28/2022 15:01    Procedures Procedures    Medications Ordered in ED Medications - No data to display  ED Course/ Medical Decision Making/ A&P Clinical Course as of 06/28/22 1527  Sun Jun 28, 2022  1459 Missed dialysis for 2 weeks, he is overtly jaundiced, RUQ noted on exam, neck pain atraumatically which shows concern for osteomyelitis (?)  Plan: elucidate new jaundice Iv antibiotics for osteo most likely Likely to need dialysis Will need admission  Does not make urine at this time -- primary CC was for neck pain and missed dialysis [CP]  1526 Right upper quadrant ultrasound with gallbladder wall thickening, cirrhotic liver, and right upper quadrant ascites, but with no evidence of acute cholecystitis, cholangitis, or other stone pathology. [CP]    Clinical Course User Index [CP] Anselmo Pickler, PA-C                             Medical Decision Making Amount and/or Complexity of Data Reviewed Labs: ordered. Radiology: ordered.   This is a 67 year old male presenting to the emergency department for multiple concerns.  Differential is broad and include electrolyte derangement, metabolic abnormality, volume overload, CHF exacerbation, pneumonia, osteomyelitis, obstructive jaundice, cholecystitis.  I viewed external medical records including previous MRI as documented in HPI.  EKG shows sinus rhythm without any specific ischemic changes or arrhythmia.  Chest x-ray with cardiomegaly but  no vascular congestion or edema.  I ordered and viewed laboratory workup.  Most of it is currently pending but CBC is notable for anemia with hemoglobin of 8.9.,  Thrombocytopenia with a platelet count of 144.  Right upper quadrant ultrasound shows cirrhotic liver with surrounding ascites.  No cholecystitis.  Most of the laboratory workup is pending at time of shift change..  Discussed presentation and initial workup with PA Prosperi who is assuming care.  I suspect patient will need admission for new jaundice, suspected osteomyelitis and will likely need dialysis given  he is missed it for 2 weeks although with most of the laboratory workup is pending.         Final Clinical Impression(s) / ED Diagnoses Final diagnoses:  None    Rx / DC Orders ED Discharge Orders     None         Sherrill Raring, [place] 06/28/22 1527    Wyvonnia Dusky, MD 06/28/22 1530

## 2022-06-28 NOTE — Assessment & Plan Note (Addendum)
Echo with unchanged EF, G1DD, and biatrial dilation.  Not on GDMT given ESRD, is on metoprolol for mortality benefit.  Starting [facility] today, confirmed acceptable with nephrology. - Continue metoprolol 25 mg daily - Start Entresto 24-26 mg BID

## 2022-06-28 NOTE — ED Triage Notes (Addendum)
Pt here with reports of neck pain and had an MRI done. Was told he may have an infection in his spine. Pt also states last dialysis was approx 2 weeks ago. Pt states hasn't gone due to having diarrhea. Pt jaundiced, new since christmas.

## 2022-06-28 NOTE — ED Provider Triage Note (Signed)
Emergency Medicine Provider Triage Evaluation Note  Albert Harris , a 67 y.o. male  was evaluated in triage.  Pt complains of missed dialysis for about 2 weeks due to frequent diarrhea. Patient states any oral intake immediately results in diarrhea. New onset of jaundice since Christmas.  Review of Systems  Positive: Jaundice, diarrhea Negative: Abdominal pain, chest pain, shortness of breath  Physical Exam  BP (!) 196/87   Pulse 79   Temp 97.6 F (36.4 C)   Resp 20   SpO2 97%  Gen:   Awake, no distress   Resp:  Normal effort  MSK:   Moves extremities without difficulty  Other:  Jaundice   Medical Decision Making  Medically screening exam initiated at 1:43 PM.  Appropriate orders placed.  Albert Harris was informed that the remainder of the evaluation will be completed by another provider, this initial triage assessment does not replace that evaluation, and the importance of remaining in the ED until their evaluation is complete.     Etta Quill, NP 06/28/22 1354

## 2022-06-28 NOTE — Progress Notes (Signed)
Pt arrived to unit, assessed, informed of POC

## 2022-06-28 NOTE — Assessment & Plan Note (Addendum)
BP still hypertensive, but starting Entresto today.  Outpatient follow up. - Continue amlodipine 10 mg daily - Start Entresto 24-26 mg BID

## 2022-06-29 ENCOUNTER — Observation Stay (HOSPITAL_COMMUNITY): Payer: Medicare Other

## 2022-06-29 DIAGNOSIS — N186 End stage renal disease: Secondary | ICD-10-CM | POA: Diagnosis not present

## 2022-06-29 DIAGNOSIS — Z992 Dependence on renal dialysis: Secondary | ICD-10-CM | POA: Diagnosis not present

## 2022-06-29 DIAGNOSIS — K7031 Alcoholic cirrhosis of liver with ascites: Secondary | ICD-10-CM

## 2022-06-29 DIAGNOSIS — R188 Other ascites: Secondary | ICD-10-CM | POA: Diagnosis not present

## 2022-06-29 DIAGNOSIS — I5021 Acute systolic (congestive) heart failure: Secondary | ICD-10-CM

## 2022-06-29 DIAGNOSIS — R197 Diarrhea, unspecified: Secondary | ICD-10-CM | POA: Diagnosis not present

## 2022-06-29 DIAGNOSIS — M4802 Spinal stenosis, cervical region: Secondary | ICD-10-CM | POA: Diagnosis not present

## 2022-06-29 LAB — BLOOD CULTURE ID PANEL (REFLEXED) - BCID2

## 2022-06-29 LAB — CBC
HCT: 26.9 % — ABNORMAL LOW (ref 39.0–52.0)
Hemoglobin: 8.6 g/dL — ABNORMAL LOW (ref 13.0–17.0)
MCH: 32.2 pg (ref 26.0–34.0)
MCHC: 32 g/dL (ref 30.0–36.0)
MCV: 100.7 fL — ABNORMAL HIGH (ref 80.0–100.0)
Platelets: 123 10*3/uL — ABNORMAL LOW (ref 150–400)
RBC: 2.67 MIL/uL — ABNORMAL LOW (ref 4.22–5.81)
RDW: 15.4 % (ref 11.5–15.5)
WBC: 8.4 10*3/uL (ref 4.0–10.5)
nRBC: 0 % (ref 0.0–0.2)

## 2022-06-29 LAB — PHOSPHORUS: Phosphorus: 11.6 mg/dL — ABNORMAL HIGH (ref 2.5–4.6)

## 2022-06-29 LAB — COMPREHENSIVE METABOLIC PANEL
ALT: 8 U/L (ref 0–44)
AST: 10 U/L — ABNORMAL LOW (ref 15–41)
Albumin: 3.3 g/dL — ABNORMAL LOW (ref 3.5–5.0)
Alkaline Phosphatase: 74 U/L (ref 38–126)
Anion gap: 28 — ABNORMAL HIGH (ref 5–15)
BUN: 111 mg/dL — ABNORMAL HIGH (ref 8–23)
CO2: 15 mmol/L — ABNORMAL LOW (ref 22–32)
Calcium: 9.1 mg/dL (ref 8.9–10.3)
Chloride: 97 mmol/L — ABNORMAL LOW (ref 98–111)
Creatinine, Ser: 29.32 mg/dL — ABNORMAL HIGH (ref 0.61–1.24)
GFR, Estimated: 1 mL/min — ABNORMAL LOW (ref >60)
Glucose, Bld: 92 mg/dL (ref 70–99)
Potassium: 3.8 mmol/L (ref 3.5–5.1)
Sodium: 140 mmol/L (ref 135–145)
Total Bilirubin: 0.9 mg/dL (ref 0.3–1.2)
Total Protein: 7.1 g/dL (ref 6.5–8.1)

## 2022-06-29 LAB — CLOSTRIDIUM DIFFICILE BY PCR, REFLEXED: Toxigenic C. Difficile by PCR: POSITIVE — AB

## 2022-06-29 LAB — MAGNESIUM: Magnesium: 3.1 mg/dL — ABNORMAL HIGH (ref 1.7–2.4)

## 2022-06-29 LAB — ECHOCARDIOGRAM COMPLETE
AR max vel: 2.69 cm2
AV Area VTI: 2.74 cm2
AV Area mean vel: 2.5 cm2
AV Mean grad: 9 mmHg
AV Peak grad: 16.6 mmHg
Ao pk vel: 2.04 m/s
Area-P 1/2: 4.68 cm2
Height: 68 in
S' Lateral: 4.3 cm
Weight: 2423.3 oz

## 2022-06-29 LAB — C DIFFICILE QUICK SCREEN W PCR REFLEX
C Diff antigen: POSITIVE — AB
C Diff toxin: NEGATIVE

## 2022-06-29 LAB — PROTIME-INR
INR: 1.3 — ABNORMAL HIGH (ref 0.8–1.2)
Prothrombin Time: 16.5 seconds — ABNORMAL HIGH (ref 11.4–15.2)

## 2022-06-29 MED ORDER — RIFAXIMIN 550 MG PO TABS
550.0000 mg | ORAL_TABLET | Freq: Two times a day (BID) | ORAL | Status: DC
  Administered 2022-06-29 – 2022-06-30 (×2): 550 mg via ORAL
  Filled 2022-06-29 (×2): qty 1

## 2022-06-29 MED ORDER — DARBEPOETIN ALFA 100 MCG/0.5ML IJ SOSY
100.0000 ug | PREFILLED_SYRINGE | INTRAMUSCULAR | Status: DC
  Administered 2022-06-30: 100 ug via SUBCUTANEOUS
  Filled 2022-06-29: qty 0.5

## 2022-06-29 MED ORDER — CALCIUM ACETATE (PHOS BINDER) 667 MG PO CAPS
1334.0000 mg | ORAL_CAPSULE | Freq: Three times a day (TID) | ORAL | Status: DC
  Administered 2022-06-29 – 2022-07-01 (×6): 1334 mg via ORAL
  Filled 2022-06-29 (×6): qty 2

## 2022-06-29 MED ORDER — NEPRO/CARBSTEADY PO LIQD
237.0000 mL | Freq: Two times a day (BID) | ORAL | Status: DC
  Administered 2022-07-01: 237 mL via ORAL

## 2022-06-29 MED ORDER — FIDAXOMICIN 200 MG PO TABS
200.0000 mg | ORAL_TABLET | Freq: Two times a day (BID) | ORAL | Status: DC
  Administered 2022-06-30 – 2022-07-01 (×4): 200 mg via ORAL
  Filled 2022-06-29 (×5): qty 1

## 2022-06-29 NOTE — Hospital Course (Addendum)
Albert Harris is a 67 y.o.male with a history of HFrEF (EF 35-40), ESRD on dialysis, COPD, HTN, PAD, R leg lymphedema, and Hx stroke  who was admitted to the [place] at [facility] following missing dialysis for 2-3 weeks with findings of C. diff and cardiac ascites s/p paracentesis .  * ESRD (end stage renal disease) on dialysis ([place]) Missed dialysis for 2-3 weeks due to diarrhea and transportation needs. Cr>30 and eGFR<1.  Labs notable for elevated BUN, phosphorus, anion gap, low bicarb. Patient received 2 days of HD following admission and then discharged on third day.  HD drew total of 4 L while inpatient. On TTS schedule for HD at discharge.  Cirrhosis of liver ([place]) Noted to have right upper quadrant tenderness and hepatomegaly on admission.  RUQ [place] noted cirrhotic changes, moderated ascites, and gallbladder wall thickening (no stones).  Labs notable for PT/INR mildly increased, albumin low, Plt low, AST low, ALT wnl, T bili wnl.  Consulted GI.  Started Protonix 40 mg IV twice daily and lactulose for 1 day.  Hepatitis B and ceruloplasmin labs within normal limits.  GI obtained paracentesis, drawling 15 mL of cloudy ascitic fluid.  Analysis showed SAAG 1.2 and ascitic protein 3.3, consistent with cardiac ascites.    C. diff infection Diarrhea Presented following 5 to 7 weeks of loose stools 3-5 times per day, preventing patient from going to dialysis due to embarrassment.  C. difficile antigen and PCR testing positive.  Discontinued lactulose and started on 10 days of fidaxomicin 200 mg twice daily with 8.5 days of treatment and left at discharge.  Diarrhea improving and decreasing in frequency when discharged.  HFrEF (heart failure with reduced ejection fraction) ([place]) Physical exam on admission with signs of volume overload.  TTE showed 35-40% (unchanged from 05/2021), G1DD, and severe biatrial dilation.  Patient maintained on home metoprolol 25 mg daily and started on Entresto 24-26 mg BID  with nephrology approval.  Will follow up with cardiology outpatient given cardiac ascites as described above.   Spinal stenosis of cervical region Presented following approximately 5 to 7 week history of back/neck pain after a fall.  Gabapentin for known cervical myelopathy managed by Ortho held in setting of ESRD.  Recent MRI 06/25/22 showed spinal stenosis of C3-C4 and acute spondyloarthropathy of C5-C6 with concern for possible cervical discitis/osteomyelitis.  Patient afebrile and physical exam consistent with spinal stenosis.  Consulted patient's orthopedic physician concerning possible osteomyelitis and specialist advised that patient will require surgery for spondyloarthropathy but can follow-up outpatient.  Hypertension Consistently hypertensive this admission, but difficult to evaluate in setting of volume overload due to missing dialysis.  Continued on home amlodipine 10 mg, follow up outpatient.   Lymphedema Patient with known history of lymphedema in RLE from past cellulitis.  Continued to have chronic significant RLE>LLE edema without concern for DVT.  Other chronic conditions were medically managed with home medications and formulary alternatives as necessary (COPD, hyperphosphatemia, secondary hyperparathyroidism).

## 2022-06-29 NOTE — Evaluation (Signed)
Occupational Therapy Evaluation/Discharge Patient Details Name: Albert Harris MRN: [id number] DOB: 02/14/1956 Today's Date: 06/29/2022   History of Present Illness [name] admitted for ESRD (missed HD for 2 weeks), diarrhea, and jaundice in setting of cirrhosis. PMH includes s/p 3/31 R CFA endarterectomy and femoral to popliteal bypass with vein, recent history of DVT, R great toe wound, ESRD on HD TTS, severe PAD, hypertension, COPD with tobacco abuse, hyperlipidemia and HFrEF.   Clinical Impression   PTA, pt lives with spouse, typically Independent with ADLs, basic IADLs and mobility without use of AD. Pt presents now at baseline for ADLs w/ mild balance deficits noted with long distance mobility. Pt able to self correct LOB w/ min guard provided for safety. Educated on fall prevention strategies with pt actively engaged in discussion (has grab bars in shower, railing for stairs, etc). Anticipate no further skilled OT services needed at acute level or on DC. Would benefit from mobility specialist's service to maximize endurance and mobility during admission. OT to sign off at acute level.       Recommendations for follow up therapy are one component of a multi-disciplinary discharge planning process, led by the attending physician.  Recommendations may be updated based on patient status, additional functional criteria and insurance authorization.   Follow Up Recommendations  No OT follow up     Assistance Recommended at Discharge PRN  Patient can return home with the following Direct supervision/assist for medications management    Functional Status Assessment  Patient has not had a recent decline in their functional status  Equipment Recommendations  None recommended by OT    Recommendations for Other Services       Precautions / Restrictions Precautions Precautions: Fall Restrictions Weight Bearing Restrictions: No      Mobility Bed Mobility Overal bed  mobility: Modified Independent                  Transfers Overall transfer level: Independent Equipment used: None                      Balance Overall balance assessment: Mild deficits observed, not formally tested                                         ADL either performed or assessed with clinical judgement   ADL Overall ADL's : At baseline                                       General ADL Comments: able to reach to pull up socks, don gown around back. guided pt in mobility around unit loop x 2 with min guard, noted unsteadiness/sway with second bout though pt able to self correct. educated on fall prevention precautions, having dogs in another room to allow pt to enter home and sit down at DC to prevent fall. active discussion with pt, reports he has a grab bar for tub transfers, rail for stairs, etc     Vision Ability to See in Adequate Light: 0 Adequate Patient Visual Report: No change from baseline Vision Assessment?: No apparent visual deficits     Perception     Praxis      Pertinent Vitals/Pain Pain Assessment Pain Assessment: No/denies pain  Hand Dominance Right   Extremity/Trunk Assessment Upper Extremity Assessment Upper Extremity Assessment: Overall WFL for tasks assessed   Lower Extremity Assessment Lower Extremity Assessment: Defer to PT evaluation (RLE chronically larger than LLE d/t bypass graft)   Cervical / Trunk Assessment Cervical / Trunk Assessment: Normal   Communication Communication Communication: HOH   Cognition Arousal/Alertness: Awake/alert Behavior During Therapy: WFL for tasks assessed/performed Overall Cognitive Status: Within Functional Limits for tasks assessed                                       General Comments       Exercises     Shoulder Instructions      Home Living Family/patient expects to be discharged to:: Private residence Living  Arrangements: Spouse/significant other Available Help at Discharge: Family;Available 24 hours/day Type of Home: House Home Access: Stairs to enter [employer] of Steps: 3 Entrance Stairs-Rails: Right Home Layout: Two level;Able to live on main level with bedroom/bathroom (laundry on second floor)     Bathroom Shower/Tub: Teacher, early years/pre: Standard     Home Equipment: Conservation officer, nature (2 wheels);Wheelchair - manual;Grab bars - tub/shower;Grab bars - toilet;Hand held shower head          Prior Functioning/Environment Prior Level of Function : Independent/Modified Independent             Mobility Comments: ambulates without AD, endorses only one fall recently due to one of their dogs knocking him down ADLs Comments: independent ADLs, limited IADLs, drive, spouse assists with meds        OT Problem List: Impaired balance (sitting and/or standing)      OT Treatment/Interventions:      OT Goals(Current goals can be found in the care plan section) Acute Rehab OT Goals Patient Stated Goal: home soon, get a cup of coffee OT Goal Formulation: All assessment and education complete, DC therapy  OT Frequency:      Co-evaluation              AM-PAC OT "6 Clicks" Daily Activity     Outcome Measure Help from another person eating meals?: None Help from another person taking care of personal grooming?: None Help from another person toileting, which includes using toliet, bedpan, or urinal?: None Help from another person bathing (including washing, rinsing, drying)?: None Help from another person to put on and taking off regular upper body clothing?: None Help from another person to put on and taking off regular lower body clothing?: None 6 Click Score: 24   End of Session Nurse Communication: Mobility status  Activity Tolerance: Patient tolerated treatment well Patient left: in bed;with call bell/phone within reach;with nursing/sitter in  room  OT Visit Diagnosis: Unsteadiness on feet (R26.81)                Time: 5809-9833 OT Time Calculation (min): 18 min Charges:  OT General Charges $OT Visit: 1 Visit OT Evaluation $OT Eval Low Complexity: 1 Low  Malachy Chamber, OTR/L Acute Rehab Services Office: [phone]   Layla Maw 06/29/2022, 8:03 AM

## 2022-06-29 NOTE — Progress Notes (Signed)
Echocardiogram 2D Echocardiogram has been performed.  Ronny Flurry 06/29/2022, 2:18 PM

## 2022-06-29 NOTE — Progress Notes (Signed)
PT Cancellation Note  Patient Details Name: Albert Harris MRN: [id number] DOB: [birth year]   Cancelled Treatment:    Reason Eval/Treat Not Completed: Patient at procedure or test/unavailable  Currently off unit for HD. Will attempt to follow-up for evaluation this afternoon as schedule allows.  Ellouise Newer 06/29/2022, 9:31 AM

## 2022-06-29 NOTE — Progress Notes (Signed)
Pt receives out-pt HD at [facility] on TTS. Will assist as needed.   Melven Sartorius Renal Navigator [phone]

## 2022-06-29 NOTE — Progress Notes (Signed)
PHARMACY - PHYSICIAN COMMUNICATION CRITICAL VALUE ALERT - BLOOD CULTURE IDENTIFICATION (BCID)  Albert Harris is an 67 y.o. male who presented to [facility] on 06/28/2022 with a chief complaint of diarrhea and cirrhosis  Assessment:  Pt with 1/2 bottles with staph epi (+mecA) - only one blood cx drawn - likely contaminant  Name of physician (or Provider) Contacted: Dr. Joeseph Amor  Current antibiotics: None  Changes to prescribed antibiotics recommended:  No additional antibiotics needed at this time  Results for orders placed or performed during the hospital encounter of 06/28/22  Blood Culture ID Panel (Reflexed) (Collected: 06/28/2022  2:40 PM)  Result Value Ref Range   Enterococcus faecalis NOT DETECTED NOT DETECTED   Enterococcus Faecium NOT DETECTED NOT DETECTED   Listeria monocytogenes NOT DETECTED NOT DETECTED   Staphylococcus species DETECTED (A) NOT DETECTED   Staphylococcus aureus (BCID) NOT DETECTED NOT DETECTED   Staphylococcus epidermidis DETECTED (A) NOT DETECTED   Staphylococcus lugdunensis NOT DETECTED NOT DETECTED   Streptococcus species NOT DETECTED NOT DETECTED   Streptococcus agalactiae NOT DETECTED NOT DETECTED   Streptococcus pneumoniae NOT DETECTED NOT DETECTED   Streptococcus pyogenes NOT DETECTED NOT DETECTED   A.calcoaceticus-baumannii NOT DETECTED NOT DETECTED   Bacteroides fragilis NOT DETECTED NOT DETECTED   Enterobacterales NOT DETECTED NOT DETECTED   Enterobacter cloacae complex NOT DETECTED NOT DETECTED   Escherichia coli NOT DETECTED NOT DETECTED   Klebsiella aerogenes NOT DETECTED NOT DETECTED   Klebsiella oxytoca NOT DETECTED NOT DETECTED   Klebsiella pneumoniae NOT DETECTED NOT DETECTED   Proteus species NOT DETECTED NOT DETECTED   Salmonella species NOT DETECTED NOT DETECTED   Serratia marcescens NOT DETECTED NOT DETECTED   Haemophilus influenzae NOT DETECTED NOT DETECTED   Neisseria meningitidis NOT DETECTED NOT DETECTED   Pseudomonas  aeruginosa NOT DETECTED NOT DETECTED   Stenotrophomonas maltophilia NOT DETECTED NOT DETECTED   Candida albicans NOT DETECTED NOT DETECTED   Candida auris NOT DETECTED NOT DETECTED   Candida glabrata NOT DETECTED NOT DETECTED   Candida krusei NOT DETECTED NOT DETECTED   Candida parapsilosis NOT DETECTED NOT DETECTED   Candida tropicalis NOT DETECTED NOT DETECTED   Cryptococcus neoformans/gattii NOT DETECTED NOT DETECTED   Methicillin resistance mecA/C DETECTED (A) NOT DETECTED    Sherlon Handing, PharmD, BCPS Please see amion for complete clinical pharmacist phone list 06/29/2022  6:53 PM

## 2022-06-29 NOTE — Evaluation (Signed)
Physical Therapy Evaluation Patient Details Name: Albert Harris MRN: [id number] DOB: June 07, 1956 Today's Date: 06/29/2022  History of Present Illness  [name] admitted for ESRD (missed HD for 2 weeks), diarrhea, and jaundice in setting of cirrhosis. PMH includes s/p 3/31 R CFA endarterectomy and femoral to popliteal bypass with vein, recent history of DVT, R great toe wound, ESRD on HD TTS, severe PAD, hypertension, COPD with tobacco abuse, hyperlipidemia and HFrEF.  Clinical Impression  Pt admitted with above diagnosis. Previously independent. Pt lethargic during PT evaluation but willing to participate in room with functional mobility assessment. Required min assist for balance initially with ambulating in room but quickly progressed to min guard level; suspect due to lethargy. Reports 24/7 assist available from wife at home. 3 Steps to enter home with a rail and willing to practice this at next PT session but would like to rest at this time. Pt currently with functional limitations due to the deficits listed below (see PT Problem List). Pt will benefit from skilled PT to increase their independence and safety with mobility to allow discharge to the venue listed below.          Recommendations for follow up therapy are one component of a multi-disciplinary discharge planning process, led by the attending physician.  Recommendations may be updated based on patient status, additional functional criteria and insurance authorization.  Follow Up Recommendations Outpatient PT (Anticipate improvement next session- very lethargic after dialysis. Will update as appropriate)      Assistance Recommended at Discharge Intermittent Supervision/Assistance  Patient can return home with the following  A little help with walking and/or transfers;A little help with bathing/dressing/bathroom;Assistance with cooking/housework;Assist for transportation;Help with stairs or ramp for entrance     Equipment Recommendations  (TBD, may need RW - will assess when pt more alert.)  Recommendations for Other Services       Functional Status Assessment Patient has had a recent decline in their functional status and demonstrates the ability to make significant improvements in function in a reasonable and predictable amount of time.     Precautions / Restrictions Precautions Precautions: Fall Restrictions Weight Bearing Restrictions: No      Mobility  Bed Mobility Overal bed mobility: Modified Independent             General bed mobility comments: extra time    Transfers Overall transfer level: Needs assistance Equipment used: Rolling walker (2 wheels) Transfers: Sit to/from Stand Sit to Stand: Supervision           General transfer comment: supervision for safety, slow to rise. Suspect due to fatigue/lethargy there is some mild instability.    Ambulation/Gait Ambulation/Gait assistance: Min assist Gait Distance (Feet): 22 Feet Assistive device: Rolling walker (2 wheels) Gait Pattern/deviations: Step-through pattern, Decreased stride length, Drifts right/left, Staggering left Gait velocity: slower Gait velocity interpretation: <1.31 ft/sec, indicative of household ambulator   General Gait Details: Min assist for balance with initial steps in room leaning towards Rt. Pt lethargic. Able to walk around room with RW for light support. Cues for use and not to lift during turns.  Stairs Stairs:  (Declines at this time due to fatigue)          Wheelchair Mobility    Modified Rankin (Stroke Patients Only)       Balance Overall balance assessment: Mild deficits observed, not formally tested  Pertinent Vitals/Pain Pain Assessment Pain Assessment: No/denies pain    Home Living Family/patient expects to be discharged to:: Private residence Living Arrangements: Spouse/significant other Available  Help at Discharge: Family;Available 24 hours/day Type of Home: House Home Access: Stairs to enter Entrance Stairs-Rails: Right Entrance Stairs-Number of Steps: 3   Home Layout: Two level;Able to live on main level with bedroom/bathroom (laundry on second floor) Home Equipment: Conservation officer, nature (2 wheels);Wheelchair - manual;Grab bars - tub/shower;Grab bars - toilet;Hand held shower head      Prior Function Prior Level of Function : Independent/Modified Independent             Mobility Comments: ambulates without AD, endorses only one fall recently due to one of their dogs knocking him down ADLs Comments: independent ADLs, limited IADLs, drive, spouse assists with meds     Hand Dominance   Dominant Hand: Right    Extremity/Trunk Assessment   Upper Extremity Assessment Upper Extremity Assessment: Defer to OT evaluation    Lower Extremity Assessment Lower Extremity Assessment: Generalized weakness    Cervical / Trunk Assessment Cervical / Trunk Assessment: Normal  Communication   Communication: HOH  Cognition Arousal/Alertness: Lethargic, Suspect due to medications Behavior During Therapy: WFL for tasks assessed/performed Overall Cognitive Status: Within Functional Limits for tasks assessed                                 General Comments: Pt states very tired after dialysis then has had several tests today. Very much wants to sleep        General Comments      Exercises     Assessment/Plan    PT Assessment Patient needs continued PT services  PT Problem List Decreased strength;Decreased balance;Decreased activity tolerance;Decreased mobility;Decreased knowledge of use of DME       PT Treatment Interventions DME instruction;Gait training;Functional mobility training;Stair training;Therapeutic activities;Therapeutic exercise;Balance training;Neuromuscular re-education;Patient/family education    PT Goals (Current goals can be found in the Care  Plan section)  Acute Rehab PT Goals Patient Stated Goal: go home soon PT Goal Formulation: With patient Time For Goal Achievement: 07/06/22 Potential to Achieve Goals: Good    Frequency Min 3X/week     Co-evaluation               AM-PAC PT "6 Clicks" Mobility  Outcome Measure Help needed turning from your back to your side while in a flat bed without using bedrails?: None Help needed moving from lying on your back to sitting on the side of a flat bed without using bedrails?: A Little Help needed moving to and from a bed to a chair (including a wheelchair)?: A Little Help needed standing up from a chair using your arms (e.g., wheelchair or bedside chair)?: A Little Help needed to walk in hospital room?: A Little Help needed climbing 3-5 steps with a railing? : A Little 6 Click Score: 19    End of Session Equipment Utilized During Treatment: Gait belt Activity Tolerance: Patient limited by fatigue;Patient limited by lethargy Patient left: in bed;with call bell/phone within reach;with bed alarm set   PT Visit Diagnosis: Unsteadiness on feet (R26.81);Other abnormalities of gait and mobility (R26.89);Muscle weakness (generalized) (M62.81);History of falling (Z91.81);Difficulty in walking, not elsewhere classified (R26.2)    Time: 1607-3710 PT Time Calculation (min) (ACUTE ONLY): 13 min   Charges:   PT Evaluation $PT Eval Low Complexity: 1 Low  Candie Mile, PT, DPT Physical Therapist Acute Rehabilitation Services [facility] Out[facility]   Ellouise Newer 06/29/2022, 3:18 PM

## 2022-06-29 NOTE — Consult Note (Addendum)
[facility] Consult: 2:07 PM 06/29/2022  LOS: 0 days    Referring Provider: Dr Precious Gilding DO, resident.    Primary Care Physician:  Kerin Perna, NP Primary Gastroenterologist: Althia Forts.  Inpatient colonoscopy 2022 with Dr. Carol Ada    Reason for Consultation: Evaluation for new diagnosis of cirrhosis.  Diarrhea.   HPI: Albert Harris is a 67 y.o. male.  PMH ESRD, on HD TTS.  COPD.  Anemia chronic kidney disease, previously on ESA.  Secondary hyperparathyroidism/hyperphosphatemia.  Cervical spine stenosis, neck pain.  PVD.  08/2021 right femoropopliteal bypass. 09/21/2021 evacuation of lymphocele from right groin and application of wound VAC.  ETOH use disorder.  Drinking 6 pack daily age 73 to 2019 per chart.  Carotid stenosis.  CVA.  PFO.  COPD.  CHF.  LVEF on TEE 05/2021 was 35 to 40%, mild reduction in RV systolic function.  Severe left atrial dilation, severe right atrial dilation, no significant valvular disease or regurgitation.  Thrombocytopenia in the 140s March 2023. 08/2004 colonoscopy: Unable to locate procedure report but polyp pathology was hyperplastic. 06/2020 colonoscopy.  For evaluation painless hematochezia.  Dr. Myrtie Neither a solitary, friable oozing rectal ulcer, suspicion of flat/small visible vessel.  Suspicious for stercoral ulcer.  This was endoclipped.  Scattered sigmoid/descending diverticulosis.  Large internal/external hemorrhoids. Orders for Cologuard placed in 04/2022 but does not look as though specimen ever submitted.  Presented to ED yesterday.  He had not gone to dialysis for 2 weeks because of frequent diarrhea and lack of transportation.  Also described jaundice starting around Christmas, 1 month ago.  As I spoke with him today, patient was unable to recall how many stools he  is having in a 24-hour period.  He did say he has not seen bloody stools.  The nurse this morning saw a yellowish-green stool on bed pad after he returned from dialysis.  Patient denies abdominal pain, nausea, vomiting, says his appetite is good.  Does not appear to have been using NSAIDs.  Denies antibiotics in recent months  RUQ ultrasound shows liver cirrhosis, moderate ascites at RUQ.  Gallbladder wall thickening without stones or sonographic Murphy's.  CBD 4 mm. Note that he last had non contrast CT imaging of abdomen pelvis in late March and early April 2023.  Neither of these demonstrated findings of cirrhosis or fatty liver etc.  The March CT showed a 1.5 cm hypodense area in the posterior right hepatic lobe demonstrating non-specific nodularity at periphery.  RUQ ultrasound 04/2021 like wise showed no changes in the liver but did demonstrate small perihepatic ascites. LFTs are normal with below normal AST. Hgb 8.6.  MCV 100.  Platelets 123.  INR 1.3. Lipase, ammonia normal.   HBV surf Ag non-reactive.  HBV surface Ab pndg.  HCV, HBV core Ab non-reactive, HBV surface Ab reactive w quant 21 cw immunity 12/2021.  HBV core Ab total and IgM both non reactive in 2022, 2023.  No Hep A tests noted. No BNP testing currently, as high as 4500 in 04/2021.    Family Hx colon  cancer in Dad, stomach cancer in brother.  Patient himself is unaware of this, this was gleaned from the chart. Lives with his wife in [place].  Retired Passenger transport manager.  No alcohol or cigarettes for 5 years.  Denies previous heavy use of alcohol.    Past Medical History:  Diagnosis Date   Anemia    Asthma    Carotid stenosis 09/12/2019   CHF (congestive heart failure) (HCC)    Claudication in peripheral vascular disease ([place]) 66/11/3014   Complication of anesthesia    " i HAVE A HARD TIME WAKING UP " (only one time)   COPD (chronic obstructive pulmonary disease) ([place])    COVID-19    positive on  06/16/20   ESRD on  hemodialysis ([place])    t, th. sat dialysis   GERD (gastroesophageal reflux disease)    HLD (hyperlipidemia)    Hypertension    Peripheral vascular disease (HCC)    PFO with atrial septal aneurysm    by echo 11/2017   Pneumonia    Stroke [facility])    [name] (transient ischemic attack) 11/2017   Tobacco abuse    quit 07/17/19    Past Surgical History:  Procedure Laterality Date   ABDOMINAL AORTOGRAM W/LOWER EXTREMITY N/A 06/21/2020   Procedure: ABDOMINAL AORTOGRAM W/LOWER EXTREMITY;  Surgeon: Angelia Mould, MD;  Location: [place] [facility];  Service: Cardiovascular;  Laterality: N/A;   ABDOMINAL AORTOGRAM W/LOWER EXTREMITY Right 08/29/2021   Procedure: ABDOMINAL AORTOGRAM W/LOWER EXTREMITY;  Surgeon: Cherre Robins, MD;  Location: [place] [facility];  Service: Cardiovascular;  Laterality: Right;   APPLICATION OF WOUND VAC Right 09/21/2021   Procedure: APPLICATION OF WOUND VAC;  Surgeon: Angelia Mould, MD;  Location: [place];  Service: Vascular;  Laterality: Right;   AV FISTULA PLACEMENT Left 03/28/2020   Procedure: ARTERIOVENOUS (AV) FISTULA CREATION LEFT;  Surgeon: [name], MD;  Location: [place];  Service: Vascular;  Laterality: Left;   AV FISTULA PLACEMENT Right 09/23/2020   Procedure: RIGHT Arm ARTERIOVENOUS GRAFT CREATION.;  Surgeon: Angelia Mould, MD;  Location: [place];  Service: Vascular;  Laterality: Right;   COLONOSCOPY     COLONOSCOPY WITH PROPOFOL N/A 06/27/2020   Procedure: COLONOSCOPY WITH PROPOFOL;  Surgeon: Carol Ada, MD;  Location: [place];  Service: Endoscopy;  Laterality: N/A;   DRAINAGE AND CLOSURE OF LYMPHOCELE Right 09/21/2021   Procedure: EVACUATION OF LYMPHOCELE RIGHT GROIN;  Surgeon: Angelia Mould, MD;  Location: [place];  Service: Vascular;  Laterality: Right;   ENDARTERECTOMY FEMORAL Right 09/05/2021   Procedure: ENDARTERECTOMY FEMORAL;  Surgeon: Angelia Mould, MD;  Location: [place];  Service: Vascular;  Laterality:  Right;   FEMORAL-POPLITEAL BYPASS GRAFT Right 09/05/2021   Procedure: RIGHT FEMORAL-POPLITEAL ARTERY BYPASS WITH Spaphenous Vein;  Surgeon: Angelia Mould, MD;  Location: [place];  Service: Vascular;  Laterality: Right;   [place]  06/27/2020   Procedure: HEMOSTASIS CLIP PLACEMENT;  Surgeon: Carol Ada, MD;  Location: [place];  Service: Endoscopy;;   INSERTION OF DIALYSIS CATHETER Right 06/24/2020   Procedure: INSERTION OF TUNNELED  DIALYSIS CATHETER; Removal of temporary dialysis catheter right neck;  Surgeon: Angelia Mould, MD;  Location: [place];  Service: Vascular;  Laterality: Right;   LIGATION OF ARTERIOVENOUS  FISTULA Left 07/24/2020   Procedure: LIGATION OF LEFT BRACHIOCEPHALIC ARTERIOVENOUS  FISTULA;  Surgeon: [name], MD;  Location: [employer];  Service: Vascular;  Laterality: Left;  PERIPHERAL VASCULAR INTERVENTION Right 06/21/2020   Procedure: PERIPHERAL VASCULAR INTERVENTION;  Surgeon: Angelia Mould, MD;  Location: [place] [facility];  Service: Cardiovascular;  Laterality: Right;   TEE WITHOUT CARDIOVERSION N/A 05/13/2021   Procedure: TRANSESOPHAGEAL ECHOCARDIOGRAM (TEE);  Surgeon: Donato Heinz, MD;  Location: [facility] ENDOSCOPY;  Service: Cardiovascular;  Laterality: N/A;   VEIN HARVEST Right 09/05/2021   Procedure: RIGHT Saphenous VEIN HARVEST;  Surgeon: Angelia Mould, MD;  Location: [place];  Service: Vascular;  Laterality: Right;    Prior to Admission medications   Medication Sig Start Date End Date Taking? Authorizing Provider  amLODipine (NORVASC) 10 MG tablet Take 1 tablet (10 mg total) by mouth daily. 05/07/22  Yes Kerin Perna, NP  B Complex-C-Zn-Folic Acid (DIALYVITE/ZINC) TABS Take 1 tablet by mouth daily. 04/25/22  Yes [provider]  calcium acetate (PHOSLO) 667 MG capsule Take 2 capsule by mouth three times a day with meals Patient taking differently: Take 1,334 mg by  mouth 3 (three) times daily with meals. 06/17/21  Yes   gabapentin (NEURONTIN) 100 MG capsule Take 1 capsule (100 mg total) by mouth 3 (three) times daily. Patient taking differently: Take 100 mg by mouth 2 (two) times daily. 05/11/22 06/28/22 Yes Callie Fielding, MD  albuterol (VENTOLIN HFA) 108 (90 Base) MCG/ACT inhaler INHALE 1-2 PUFFS INTO THE LUNGS EVERY 6 (SIX) HOURS AS NEEDED FOR WHEEZING OR SHORTNESS OF BREATH. Patient not taking: Reported on 06/28/2022 07/02/20 02/14/22  Delora Fuel, MD  aspirin EC 81 MG EC tablet Take 1 tablet (81 mg total) by mouth daily. Swallow whole. Patient not taking: Reported on 06/28/2022 08/30/21   Lacinda Axon, MD  HYDROcodone-acetaminophen (NORCO/VICODIN) 5-325 MG tablet Take 1 tablet by mouth every 6 (six) hours as needed (pain). Patient not taking: Reported on 06/28/2022 05/01/22   Barrett Henle, MD  methocarbamol (ROBAXIN) 500 MG tablet Take 1 tablet (500 mg total) by mouth 2 (two) times daily as needed for muscle spasms. Patient not taking: Reported on 06/28/2022 05/07/22   Kerin Perna, NP  metoprolol succinate (TOPROL-XL) 25 MG 24 hr tablet Take 1 tablet (25 mg total) by mouth daily. Patient not taking: Reported on 06/28/2022 12/26/21   Kerin Perna, NP    Scheduled Meds:  amLODipine  10 mg Oral Daily   calcium acetate  1,334 mg Oral TID WC   Chlorhexidine Gluconate Cloth  6 each Topical Q0600   [START ON 06/30/2022] darbepoetin (ARANESP) injection - DIALYSIS  100 mcg Subcutaneous Q Tue-1800   metoprolol succinate  25 mg Oral Daily   pantoprazole (PROTONIX) IV  40 mg Intravenous Q12H   Infusions:  PRN Meds: acetaminophen **OR** acetaminophen   Allergies as of 06/28/2022 - Review Complete 06/28/2022  Allergen Reaction Noted   Lidocaine-prilocaine Rash 08/28/2021    Family History  Problem Relation Age of Onset   Hypertension Mother    Colon cancer Father    Stomach cancer Brother     Social History   Socioeconomic  History   Marital status: Divorced    Spouse name: Not on file   Number of children: Not on file   Years of education: Not on file   Highest education level: Not on file  Occupational History   Not on file  Tobacco Use   Smoking status: Former    Packs/day: 1.00    Years: 50.00    Total pack years: 50.00    Types: Cigarettes    Quit date:  07/17/2019    Years since quitting: 2.9   Smokeless tobacco: Never  Vaping Use   Vaping Use: Never used  Substance and Sexual Activity   Alcohol use: Not Currently    Alcohol/week: 1.0 - 3.0 standard drink of alcohol    Types: 1 - 3 Cans of beer per week   Drug use: No   Sexual activity: Not Currently  Other Topics Concern   Not on file  Social History Narrative   Married   Works in Audiological scientist   Social Determinants of Health   Financial Resource Strain: [place]  (09/12/2019)   Overall Financial Resource Strain (CARDIA)    Difficulty of Paying Living Expenses: Not hard at all  Food Insecurity: Food Insecurity Present (06/28/2022)   Hunger Vital Sign    Worried About Running Out of Food in the Last Year: Sometimes true    Ran Out of Food in the Last Year: Sometimes true  Transportation Needs: Unmet Transportation Needs (06/28/2022)   PRAPARE - Hydrologist (Medical): Yes    Lack of Transportation (Non-Medical): No  Physical Activity: Not on file  Stress: No Stress Concern Present (09/12/2019)   [place]    Feeling of Stress : Not at all  Social Connections: Not on file  Intimate Partner Violence: Not At Risk (06/28/2022)   Humiliation, Afraid, Rape, and Kick questionnaire    Fear of Current or Ex-Partner: No    Emotionally Abused: No    Physically Abused: No    Sexually Abused: No    REVIEW OF SYSTEMS: Constitutional: Weakness. ENT:  No nose bleeds Pulm: Denies shortness of breath, denies cough. CV:  No palpitations.   Chronic stable right lower extremity swelling, painless. GU:  No hematuria, no frequency GI: See HPI. Heme: Denies unusual or excessive bleeding or bruising. Transfusions: Denies prior blood transfusions.  However review of epic shows he got a unit of PRBCs in January 2022 and again in December 2022.  Received Venna fair infusion in July 2022 Neuro:  No headaches, no peripheral tingling or numbness Derm:  No itching, no rash or sores.  Endocrine:  No sweats or chills.  No polyuria or dysuria Immunization: Reviewed vaccination history. Travel:  None beyond local counties in last few months.    PHYSICAL EXAM: Vital signs in last 24 hours: Vitals:   06/29/22 1215 06/29/22 1315  BP: (!) 175/82 (!) 148/102  Pulse: 85 88  Resp: (!) 27 20  Temp:  99.4 F (37.4 C)  SpO2: 98% 98%   Wt Readings from Last 3 Encounters:  06/29/22 68.7 kg  05/11/22 78.2 kg  05/07/22 78.2 kg    General: Patient looks chronically ill.  He is having difficulty remembering things.  Comfortable, no distress. Head: No facial asymmetry or swelling.  No signs of head trauma. Eyes: Conjunctiva pink.  No scleral icterus. Ears: Not hard of hearing. Nose: No congestion or discharge. Mouth: Moist, clear, pink oral mucosa.  Poor dentition.  Tongue midline. Neck: No JVD, no thyromegaly Lungs: Breath sounds diminished but clear.  No labored breathing.  No cough Heart: RRR.  No MRG.  S1, S2 present Abdomen: Soft without organomegaly.  No bruits, hernias.   Rectal: Tender but no palpable or visible lesions.  Exam glove negative for blood or stool. Musc/Skeltl: No joint redness, swelling or gross deformity. Extremities: Swelling with pitting and slight erythema but no tenderness or increased heat  on the right leg from the knee down Neurologic: Oriented to place, self, date of birth, year.  He is having difficulty word finding and drifted off during conversation with simple questions.  Positive asterixis. Skin: no  telangiectasia.  Punctate lesions on the upper right back these are scabbed over. Tattoos: None observed Nodes: No cervical adenopathy Psych: Affect flat but cooperative.  Intake/Output from previous day: No intake/output data recorded. Intake/Output this shift: Total I/O In: 480 [P.O.:480] Out: 2000 [Other:2000]  LAB RESULTS: Recent Labs    06/28/22 1340 06/29/22 0438  WBC 9.6 8.4  HGB 8.9* 8.6*  HCT 27.6* 26.9*  PLT 144* 123*   BMET Lab Results  Component Value Date   NA 140 06/29/2022   NA 139 06/28/2022   NA 136 01/01/2022   K 3.8 06/29/2022   K 3.6 06/28/2022   K 6.2 (H) 01/01/2022   CL 97 (L) 06/29/2022   CL 98 06/28/2022   CL 96 (L) 01/01/2022   CO2 15 (L) 06/29/2022   CO2 18 (L) 06/28/2022   CO2 16 (L) 01/01/2022   GLUCOSE 92 06/29/2022   GLUCOSE 102 (H) 06/28/2022   GLUCOSE 87 01/01/2022   BUN 111 (H) 06/29/2022   BUN 105 (H) 06/28/2022   BUN 102 (H) 01/01/2022   CREATININE 29.32 (H) 06/29/2022   CREATININE 30.01 (H) 06/28/2022   CREATININE 19.46 (H) 01/01/2022   CALCIUM 9.1 06/29/2022   CALCIUM 9.5 06/28/2022   CALCIUM 9.3 01/01/2022   LFT Recent Labs    06/28/22 1340 06/29/22 0438  PROT 7.3 7.1  ALBUMIN 3.3* 3.3*  AST 11* 10*  ALT 7 8  ALKPHOS 76 74  BILITOT 0.9 0.9   PT/INR Lab Results  Component Value Date   INR 1.3 (H) 06/29/2022   INR 1.3 (H) 06/28/2022   INR 1.3 (H) 09/05/2021   Hepatitis Panel Recent Labs    06/28/22 1640  HEPBSAG NON REACTIVE   C-Diff No components found for: "CDIFF" Lipase     Component Value Date/Time   LIPASE 27 06/28/2022 1352    Drugs of Abuse     Component Value Date/Time   LABOPIA NONE DETECTED 07/18/2019 0603   COCAINSCRNUR NONE DETECTED 07/18/2019 0603   LABBENZ NONE DETECTED 07/18/2019 0603   AMPHETMU NONE DETECTED 07/18/2019 0603   THCU NONE DETECTED 07/18/2019 0603   LABBARB NONE DETECTED 07/18/2019 0603     RADIOLOGY STUDIES: US Abdomen Limited RUQ (LIVER/GB)  Result Date:  06/28/2022 CLINICAL DATA:  Jaundice EXAM: ULTRASOUND ABDOMEN LIMITED RIGHT UPPER QUADRANT COMPARISON:  CT abdomen and pelvis 01/01/2022 FINDINGS: Gallbladder: No gallstones. There is gallbladder wall thickening measuring up to 3.8 mm. No sonographic Murphy sign noted by sonographer. Common bile duct: Diameter: 4 mm Liver: No focal lesion identified. There is nodular liver contour. There is increased in parenchymal echogenicity. Portal vein is patent on color Doppler imaging with normal direction of blood flow towards the liver. Moderate amount of ascites in the right upper quadrant. IMPRESSION: 1. Cirrhotic liver morphology. 2. Moderate amount of ascites in the right upper quadrant. 3. Gallbladder wall thickening measuring up to 3.8 mm. No gallstones or sonographic Murphy sign. Electronically Signed   By: Ronney Asters M.D.   On: 06/28/2022 15:14   DG Chest Portable 1 View  Result Date: 06/28/2022 CLINICAL DATA:  Weakness EXAM: PORTABLE CHEST 1 VIEW COMPARISON:  Chest radiograph 12/31/2021 FINDINGS: Monitoring leads overlie the patient. Stable cardiomegaly. Aortic atherosclerosis. No large area pulmonary consolidation. No pleural effusion or pneumothorax.  Thoracic spine degenerative changes. IMPRESSION: Cardiomegaly. No acute cardiopulmonary process. Electronically Signed   By: Lovey Newcomer M.D.   On: 06/28/2022 15:01      IMPRESSION:     New dx cirrhosis.  HBV surf Ag non-reactive.  HBV surface Ab pndg.  HCV, HBV core Ab non-reactive, HBV surface Ab reactive w quant 21 cw immunity 12/2021.  HBV core Ab total and IgM both non reactive in 2022, 2023.  No Hep A tests noted.   CT in March/April 2023 and ultrasound in 04/2021 neither of which demonstrated changes in the liver parenchyma.  This may be cirrhosis due to heart failure/cardiac cirrhosis.  Above labs ordered to rule out metabolic and autoimmune sources of cirrhosis/liver disease.      Diarrhea.  No stool studies thus far.  C diff, fecal lactoferrin,  stool PCR pndg.  Patient poor historian but from other clinicians, this has been present for a couple of weeks.  Patient denies bleeding per rectum.  06/2020 colonoscopy for painless hematochezia revealed solitary oozing rectal ulcer with suspicion of visible vessel, suspicious for stercoral ulcer, this was endoclipped.  No path specimen collected.      Coagulopathy.  Stable INR 1.3 since 08/2021.      Moderate ascites.  Needs attempt at paracentesis w fluid studies.      Thrombocytopenia.  Non critical.      ESRD.  Missed HD x 2 weeks due to diarrhea    CHF.   Repeat echo pndg.  EF 35 to 40% in 05/2021.  Repeat echocardiogram today, no results yet.    Macrocytic anemia.  Renal re-initiating ESA, Mirsera q 2 weeks at HD.  Tolerated HD this AM  Asterixis on exam.  Expressive aphasia.  Yesterday's ammonia level was normal but he appears encephalopathic by my encounter today.  Chronic myelopathy.  MR cervical spine with multiple changes including possible C5-6 discitis osteomyelitis, C3-4/C6-7 spinal stenosis.  Cord compression in C3-4, no evidence of cord compression C6-7.    PLAN:       HD planned again for tmrw.  Then will resume TTS schedule.      Await C diff, FOBT positive, stool PCR, fecal lactoferrin results, no spec submitted yet.      Await reading of todays echo.      Check ANA, AMA, ceruloplasmin, IgG.      Initiate Rifaximin, ordered.  Will not initiate lactulose given he already has problems with diarrhea.   Defer orders for paracentesis to Resident team.  Should send studies for albumin, cell count/differential.  The same day as paracentesis, should obtain serum albumin in order to calculate SAAG.     Azucena Freed  06/29/2022, 2:07 PM Phone [phone]   Attending physician's note  I have taken a history, reviewed the chart and examined the patient. I performed a substantive portion of this encounter, including complete performance of at least one of the key  components, in conjunction with the APP. I agree with the APP's note, impression and recommendations.   ESRD on hemodialysis, missed dialysis for the past 2 weeks with diarrhea and also lack of transportation  Abdominal ultrasound concerning for ascites and cirrhosis History of chronic alcohol use  MELD 3.0: 21 at 06/29/2022  4:38 AM MELD-Na: 23 at 06/29/2022  4:38 AM Calculated from: Serum Creatinine: 29.32 mg/dL (Using max of 3 mg/dL) at 06/29/2022  4:38 AM Serum Sodium: 140 mmol/L (Using max of 137 mmol/L) at 06/29/2022  4:38 AM Total Bilirubin: 0.9  mg/dL (Using min of 1 mg/dL) at 06/29/2022  4:38 AM Serum Albumin: 3.3 g/dL at 06/29/2022  4:38 AM INR(ratio): 1.3 at 06/29/2022  4:38 AM Age at listing (hypothetical): 84 years Sex: Male at 06/29/2022  4:38 AM    Please do a diagnostic paracentesis, check ascites fluid albumin, protein, SAAG, cell count Follow-up echocardiogram  Follow-up ANA, AMA, ceruloplasmin to exclude other etiology for chronic liver disease  Follow-up stool studies to exclude acute GI infection  The patient was provided an opportunity to ask questions and all were answered. The patient agreed with the plan and demonstrated an understanding of the instructions.  Damaris Hippo , MD [phone]

## 2022-06-29 NOTE — Progress Notes (Signed)
Received patient in bed to unit.  Alert and oriented.  Informed consent signed and in chart.   Treatment initiated: 0900 Treatment completed: 1200  Patient tolerated well.  Transported back to the room  Alert, without acute distress.  Hand-off given to patient's nurse.   Access used: AVG Access issues: none  Total UF removed: 2 L Medication(s) given: none Post HD VS: 170/80 P 84 R 20 O2 sat 98 in room air. Post HD weight: 69 Kg   [facility]

## 2022-06-29 NOTE — Progress Notes (Signed)
Attempted Echocardiogram patient is in Hemodialysis

## 2022-06-29 NOTE — Progress Notes (Addendum)
Daily Progress Note Intern Pager: [phone]  Patient name: Albert Harris Medical record number: [id number] Date of birth: 02-09-1956 Age: 67 y.o. Gender: male  Primary Care Provider: Kerin Perna, NP Consultants: Nephrology Code Status: Full  Pt Overview and Major Events to Date:  1/21: Admitted 1/22: 1st day of HD  Assessment and Plan: Albert Harris is a 67 y.o. male presenting with fluid overload (ESRD, missed dialysis for 2 weeks), diarrhea, new jaundice and cirrhosis findings, and back/neck pain (c/w spinal stenosis).    Pertinent PMH/PSH includes HFrEF (EF 35-40), ESRD on dialysis, COPD, HTN, PAD, R leg lymphedema, and Hx stroke.  * ESRD (end stage renal disease) on dialysis ([place]) Remains euvolemic and O2 saturating well on room air.  Going to dialysis today after 2-3 weeks missed.  Remains alert and oriented without concern for severe uricemia or encephalopathy.  1.5L net I/O in past 24 hours. - Nephrology consulted for dialysis. Appreciate care. Dialysis access preserved in R arm - AM CMP, Phos, Mg, CBC  Diarrhea Has 5-7wk hx of loose stools, significant concern for C. diff given that patient is on dialysis and prolonged diarrheal illness course.  Will test today.  Also concerned for IBD or GI bleed, but less likely at this time given lack of related symptoms.  Will CTM. - Discontinue lactulose given frequency of stools and normal mental status - f/u FOBT - Monitor stool for GI bleed - C. diff panel  Cirrhosis of liver (HCC) RUQ U/S noted cirrhotic changes, moderated ascites.  Not fully convinced patient has asterixis this morning, but does appear jaundiced despite normal bilirubin.  Given history of alcohol use disorder and exam, concern for new cirrhosis.  Labs remain borderline low-normal, with PT/INR mildly increased, albumin low, Plt low, AST low, ALT wnl, and T bili wnl.  Will d/c lactulose at this time given frequent BMs, unclear asterixis, and normal  mentation. - Hold lactulose - Protonix 40 IV BID - f/u hepatitis labs - AM CBC, CMP, PT/INR - GI consult  HFrEF (heart failure with reduced ejection fraction) ([place]) EF 05/2021 35-40%.  Not on GDMT given ESRD, is on metoprolol for mortality benefit. - f/u echo given fluid overload - Consider cardiology consult for GDMT meds  Hypertension BP remains elevated overnight, will reevaluate after dialysis. - Continue amlodipine 10 mg daily  Lymphedema Hx of lymphedema in R LE from cellulitis.  Continues to have significant RLE edema, but is chronic with low concern for DVT at this time. - Consider DVT US if resp status worsens or tachycardia develops  Spinal stenosis of cervical region Has 5-7wk hx of back/neck pain after a fall, follows with ortho and has myelopathy Dx. Remains tender to palpation in cervical paraspinal region, R>L.  Is bothered by pain, but able to manage. - Given ERSD, holding gabapentin - Tylenol PRN - PT/OT evaluation  Chronic and stable: HFrEF: Cont metoprolol COPD: Not taking home albuterol PRN  FEN/GI: Heart healthy diet PPx: SCDs due to bleed risk Dispo: Home pending clinical improvement . Barriers include dialysis, clinical workup.  Subjective:  This morning, patient reports he is most bothered by neck discomfort unchanged from yesterday.  He continues to have diarrhea every 2-3 hours and is perturbed by this fact due to embarrassment.  He is concerned about having diarrhea when he goes to dialysis today.  He reports otherwise he feels well.  Denies any new tremors, confusion, hallucinations, dizziness, or nausea/vomiting.  He knows where he is,  who he is, and how he got here.  He believes he is close to his baseline overall and remains optimistic.  Denies chest pain, shortness of breath, palpitations.  Objective: Temp:  [97.4 F (36.3 C)-99.4 F (37.4 C)] 99.4 F (37.4 C) (01/22 1315) Pulse Rate:  [65-88] 88 (01/22 1315) Resp:  [16-32] 20 (01/22  1315) BP: (123-202)/(59-118) 148/102 (01/22 1315) SpO2:  [93 %-100 %] 98 % (01/22 1315) Weight:  [68.7 kg-72.6 kg] 68.7 kg (01/22 1215)  Physical Exam: General: Age-appropriate, resting comfortably in bed, NAD. Alert to self, place, time, and situation. HEENT: Yellowing of eyes and of mucosa under tongue. Cardiovascular: Regular rate and rhythm. Normal S1/S2. No murmurs, rubs, or gallops appreciated. 2+ radial pulses. MSK: Paraspinal TTP. Better with spinal flexion, worse with extension. No point tenderness over spinal processes. Pulmonary: Clear bilaterally to ascultation. No increased WOB on RA. No wheezes, rales, or crackles. Abdominal: Marked TTP in RUQ.  No rebound or guarding.  Cannot palpate liver due to abdominal discomfort. Skin: Warm and dry.  No rashes grossly.  Mildly jaundiced skin. Neuro: Possible tremor vs asterixis of hands bilaterally.  Hand does twitch when gripping pen tightly. Grossly equal strength and sensation bilaterally. Extremities: Pitting LE edema R>L (chronic skin changes over R leg).  2+ palpable thrills in R arm fistula.  Capillary refill <2 seconds.  Laboratory: Most recent CBC Lab Results  Component Value Date   WBC 8.4 06/29/2022   HGB 8.6 (L) 06/29/2022   HCT 26.9 (L) 06/29/2022   MCV 100.7 (H) 06/29/2022   PLT 123 (L) 06/29/2022   Most recent BMP    Latest Ref Rng & Units 06/29/2022    4:38 AM  BMP  Glucose 70 - 99 mg/dL 92   BUN 8 - 23 mg/dL 111   Creatinine 0.61 - 1.24 mg/dL 29.32   Sodium 135 - 145 mmol/L 140   Potassium 3.5 - 5.1 mmol/L 3.8   Chloride 98 - 111 mmol/L 97   CO2 22 - 32 mmol/L 15   Calcium 8.9 - 10.3 mg/dL 9.1     Latest Reference Range & Units 06/28/22 13:40 06/29/22 04:38  Phosphorus 2.5 - 4.6 mg/dL --- 11.6 (H)  Magnesium 1.7 - 2.4 mg/dL --- 3.1 (H)  Albumin 3.5 - 5.0 g/dL 3.3 (L) 3.3 (L)  (H): Data is abnormally high (L): Data is abnormally low   Latest Reference Range & Units 06/28/22 15:00 06/29/22 04:38   Prothrombin Time 11.4 - 15.2 seconds 16.1 (H) 16.5 (H)  INR 0.8 - 1.2  1.3 (H) 1.3 (H)  (H): Data is abnormally high  Other pertinent labs: - MRSA swab: Positive - HBV: Pending  Imaging/Diagnostic Tests: - Echo: Pending - FOBT: Pending   Dimitri Shitarev MS4, [facility], [place] Intern pager: [phone], text pages welcome Secure chat group [facility] Teaching Service  I was personally present and re-performed the exam and medical decision making and verified the service and findings are accurately documented in the student's note.  Precious Gilding, DO 06/29/2022 2:00 PM

## 2022-06-29 NOTE — Consult Note (Addendum)
ESRD Consult Note   Assessment/Recommendations:   ESRD -Outpatient orders: [facility], TTS, 4 hours.  F1 80.  Flow rates: 350/800.  EDW 69 kg.  2K/2 calcium.  UF profile 2.  Meds: Venofer 50 mg once weekly (last dose 06/11/2022), Korsuva 0.7 mL every treatment Mircera 75 mcg every 2 weeks (last dose on 05/26/2022).  No heparin -s/p HD today, HD tomorrow, will keep on TTS schedule thereafter -will change to renal diet  Cirrhosis -new finding, GI consulted  Neck pain, cervical spinal stenosis -per primary service  Volume/ hypertension:  -will UF as tolerated  Anemia of Chronic Kidney Disease:  -Hemoglobin 8.6. Will restart ESA  Secondary Hyperparathyroidism/Hyperphosphatemia:  -restarting home binders   [name], MD [facility]  History of Present Illness: Albert Harris is a/an 67 y.o. male with a past medical history of ESRD on HD, HFrEF, HTN, COPD, h/o COPD, h/o CVA, lymphedema who presents with neck pain, diarrhea, missed dialysis for 2 weeks given diarrhea/transportation needs.Imaging revealed cirrhotic liver morphology. He did undergo dialysis earlier today with net UF 2L. Patient seen and examined bedside. Currently getting echo done. He reports that he tolerated HD earlier today. Denies any fevers, chest pain, SOB, issues with RUE AVG.   Medications:  Current Facility-Administered Medications  Medication Dose Route Frequency Provider Last Rate Last Admin   acetaminophen (TYLENOL) tablet 650 mg  650 mg Oral Q6H PRN Arlyce Dice, MD       Or   acetaminophen (TYLENOL) suppository 650 mg  650 mg Rectal Q6H PRN Arlyce Dice, MD       amLODipine (NORVASC) tablet 10 mg  10 mg Oral Daily Arlyce Dice, MD   10 mg at 06/28/22 1958   Chlorhexidine Gluconate Cloth 2 % PADS 6 each  6 each Topical Q0600 Penninger, Ria Comment, [place]   6 each at 06/29/22 2263   metoprolol succinate (TOPROL-XL) 24 hr tablet 25 mg  25 mg Oral Daily Arlyce Dice, MD   25 mg at 06/28/22 1958    pantoprazole (PROTONIX) injection 40 mg  40 mg Intravenous Bobette Mo, MD   40 mg at 06/28/22 2227     ALLERGIES Lidocaine-prilocaine  MEDICAL HISTORY Past Medical History:  Diagnosis Date   Anemia    Asthma    Carotid stenosis 09/12/2019   CHF (congestive heart failure) (HCC)    Claudication in peripheral vascular disease ([place]) 33/54/5625   Complication of anesthesia    " i HAVE A HARD TIME WAKING UP " (only one time)   COPD (chronic obstructive pulmonary disease) ([place])    COVID-19    positive on  06/16/20   ESRD on hemodialysis ([place])    t, th. sat dialysis   GERD (gastroesophageal reflux disease)    HLD (hyperlipidemia)    Hypertension    Peripheral vascular disease (HCC)    PFO with atrial septal aneurysm    by echo 11/2017   Pneumonia    Stroke [facility])    [name] (transient ischemic attack) 11/2017   Tobacco abuse    quit 07/17/19     SOCIAL HISTORY Social History   Socioeconomic History   Marital status: Divorced    Spouse name: Not on file   Number of children: Not on file   Years of education: Not on file   Highest education level: Not on file  Occupational History   Not on file  Tobacco Use   Smoking status: Former    Packs/day: 1.00    Years:  50.00    Total pack years: 50.00    Types: Cigarettes    Quit date: 07/17/2019    Years since quitting: 2.9   Smokeless tobacco: Never  Vaping Use   Vaping Use: Never used  Substance and Sexual Activity   Alcohol use: Not Currently    Alcohol/week: 1.0 - 3.0 standard drink of alcohol    Types: 1 - 3 Cans of beer per week   Drug use: No   Sexual activity: Not Currently  Other Topics Concern   Not on file  Social History Narrative   Married   Works in Audiological scientist   Social Determinants of Health   Financial Resource Strain: [place]  (09/12/2019)   Overall Financial Resource Strain (CARDIA)    Difficulty of Paying Living Expenses: Not hard at all  Food Insecurity: Food Insecurity Present  (06/28/2022)   Hunger Vital Sign    Worried About Running Out of Food in the Last Year: Sometimes true    Ran Out of Food in the Last Year: Sometimes true  Transportation Needs: Unmet Transportation Needs (06/28/2022)   PRAPARE - Hydrologist (Medical): Yes    Lack of Transportation (Non-Medical): No  Physical Activity: Not on file  Stress: No Stress Concern Present (09/12/2019)   [place]    Feeling of Stress : Not at all  Social Connections: Not on file  Intimate Partner Violence: Not At Risk (06/28/2022)   Humiliation, Afraid, Rape, and Kick questionnaire    Fear of Current or Ex-Partner: No    Emotionally Abused: No    Physically Abused: No    Sexually Abused: No     FAMILY HISTORY Family History  Problem Relation Age of Onset   Hypertension Mother    Colon cancer Father    Stomach cancer Brother      Review of Systems: 12 systems were reviewed and negative except per HPI  Physical Exam: Vitals:   06/29/22 1210 06/29/22 1215  BP: (!) 170/80 (!) 175/82  Pulse: 84 85  Resp:  (!) 27  Temp:    SpO2:  98%   Total I/O In: 480 [P.O.:480] Out: 2000 [Other:2000]  Intake/Output Summary (Last 24 hours) at 06/29/2022 1301 Last data filed at 06/29/2022 1208 Gross per 24 hour  Intake 480 ml  Output 2000 ml  Net -1520 ml   General: well-appearing, no acute distress, laying flat in bed HEENT: icteric sclera, MMM CV: normal rate, no murmurs, no edema Lungs: bilateral chest rise, normal wob Abd: soft, TTP RUQ, non-distended Skin: no visible lesions or rashes Ext: trace pitting edema b/l LEs Neuro: normal speech, no gross focal deficits, +asterixis Dialysis access: RUE AVG +b/t  Test Results Reviewed Lab Results  Component Value Date   NA 140 06/29/2022   K 3.8 06/29/2022   CL 97 (L) 06/29/2022   CO2 15 (L) 06/29/2022   BUN 111 (H) 06/29/2022   CREATININE 29.32 (H)  06/29/2022   GFR 58.30 (L) 01/19/2017   CALCIUM 9.1 06/29/2022   ALBUMIN 3.3 (L) 06/29/2022   PHOS 11.6 (H) 06/29/2022    I have reviewed relevant outside healthcare records

## 2022-06-30 ENCOUNTER — Encounter (HOSPITAL_COMMUNITY): Payer: Self-pay

## 2022-06-30 ENCOUNTER — Telehealth (HOSPITAL_COMMUNITY): Payer: Self-pay | Admitting: Pharmacy Technician

## 2022-06-30 ENCOUNTER — Other Ambulatory Visit (HOSPITAL_COMMUNITY): Payer: Self-pay

## 2022-06-30 ENCOUNTER — Observation Stay (HOSPITAL_COMMUNITY): Payer: Medicare Other

## 2022-06-30 DIAGNOSIS — R188 Other ascites: Secondary | ICD-10-CM

## 2022-06-30 DIAGNOSIS — K7460 Unspecified cirrhosis of liver: Secondary | ICD-10-CM

## 2022-06-30 DIAGNOSIS — F1090 Alcohol use, unspecified, uncomplicated: Secondary | ICD-10-CM

## 2022-06-30 DIAGNOSIS — D539 Nutritional anemia, unspecified: Secondary | ICD-10-CM | POA: Diagnosis present

## 2022-06-30 DIAGNOSIS — I132 Hypertensive heart and chronic kidney disease with heart failure and with stage 5 chronic kidney disease, or end stage renal disease: Secondary | ICD-10-CM | POA: Diagnosis present

## 2022-06-30 DIAGNOSIS — A0472 Enterocolitis due to Clostridium difficile, not specified as recurrent: Secondary | ICD-10-CM | POA: Diagnosis present

## 2022-06-30 DIAGNOSIS — E785 Hyperlipidemia, unspecified: Secondary | ICD-10-CM | POA: Diagnosis present

## 2022-06-30 DIAGNOSIS — R160 Hepatomegaly, not elsewhere classified: Secondary | ICD-10-CM | POA: Diagnosis present

## 2022-06-30 DIAGNOSIS — Z1152 Encounter for screening for COVID-19: Secondary | ICD-10-CM | POA: Diagnosis not present

## 2022-06-30 DIAGNOSIS — I5021 Acute systolic (congestive) heart failure: Secondary | ICD-10-CM | POA: Diagnosis not present

## 2022-06-30 DIAGNOSIS — N186 End stage renal disease: Secondary | ICD-10-CM | POA: Diagnosis present

## 2022-06-30 DIAGNOSIS — D696 Thrombocytopenia, unspecified: Secondary | ICD-10-CM | POA: Diagnosis present

## 2022-06-30 DIAGNOSIS — Z79899 Other long term (current) drug therapy: Secondary | ICD-10-CM | POA: Diagnosis not present

## 2022-06-30 DIAGNOSIS — D689 Coagulation defect, unspecified: Secondary | ICD-10-CM | POA: Diagnosis present

## 2022-06-30 DIAGNOSIS — Z992 Dependence on renal dialysis: Secondary | ICD-10-CM | POA: Diagnosis not present

## 2022-06-30 DIAGNOSIS — I739 Peripheral vascular disease, unspecified: Secondary | ICD-10-CM | POA: Diagnosis present

## 2022-06-30 DIAGNOSIS — N2581 Secondary hyperparathyroidism of renal origin: Secondary | ICD-10-CM | POA: Diagnosis present

## 2022-06-30 DIAGNOSIS — M4712 Other spondylosis with myelopathy, cervical region: Secondary | ICD-10-CM | POA: Diagnosis present

## 2022-06-30 DIAGNOSIS — D631 Anemia in chronic kidney disease: Secondary | ICD-10-CM | POA: Diagnosis present

## 2022-06-30 DIAGNOSIS — R4701 Aphasia: Secondary | ICD-10-CM | POA: Diagnosis present

## 2022-06-30 DIAGNOSIS — G9341 Metabolic encephalopathy: Secondary | ICD-10-CM | POA: Diagnosis present

## 2022-06-30 DIAGNOSIS — F1010 Alcohol abuse, uncomplicated: Secondary | ICD-10-CM | POA: Diagnosis present

## 2022-06-30 DIAGNOSIS — Z87891 Personal history of nicotine dependence: Secondary | ICD-10-CM | POA: Diagnosis not present

## 2022-06-30 DIAGNOSIS — I5082 Biventricular heart failure: Secondary | ICD-10-CM | POA: Diagnosis present

## 2022-06-30 DIAGNOSIS — M4622 Osteomyelitis of vertebra, cervical region: Secondary | ICD-10-CM | POA: Diagnosis present

## 2022-06-30 DIAGNOSIS — I5022 Chronic systolic (congestive) heart failure: Secondary | ICD-10-CM | POA: Diagnosis present

## 2022-06-30 HISTORY — DX: Alcohol use, unspecified, uncomplicated: F10.90

## 2022-06-30 HISTORY — PX: IR PARACENTESIS: IMG2679

## 2022-06-30 LAB — BODY FLUID CELL COUNT WITH DIFFERENTIAL
Eos, Fluid: 1 %
Lymphs, Fluid: 62 %
Monocyte-Macrophage-Serous Fluid: 18 % — ABNORMAL LOW (ref 50–90)
Neutrophil Count, Fluid: 19 % (ref 0–25)
Total Nucleated Cell Count, Fluid: 182 cu mm (ref 0–1000)

## 2022-06-30 LAB — GASTROINTESTINAL PANEL BY PCR, STOOL (REPLACES STOOL CULTURE)

## 2022-06-30 LAB — COMPREHENSIVE METABOLIC PANEL
ALT: 10 U/L (ref 0–44)
AST: 12 U/L — ABNORMAL LOW (ref 15–41)
Albumin: 2.9 g/dL — ABNORMAL LOW (ref 3.5–5.0)
Alkaline Phosphatase: 73 U/L (ref 38–126)
Anion gap: 14 (ref 5–15)
BUN: 49 mg/dL — ABNORMAL HIGH (ref 8–23)
CO2: 26 mmol/L (ref 22–32)
Calcium: 8.8 mg/dL — ABNORMAL LOW (ref 8.9–10.3)
Chloride: 99 mmol/L (ref 98–111)
Creatinine, Ser: 16.43 mg/dL — ABNORMAL HIGH (ref 0.61–1.24)
GFR, Estimated: 3 mL/min — ABNORMAL LOW (ref >60)
Glucose, Bld: 111 mg/dL — ABNORMAL HIGH (ref 70–99)
Potassium: 3.1 mmol/L — ABNORMAL LOW (ref 3.5–5.1)
Sodium: 139 mmol/L (ref 135–145)
Total Bilirubin: 0.9 mg/dL (ref 0.3–1.2)
Total Protein: 6.4 g/dL — ABNORMAL LOW (ref 6.5–8.1)

## 2022-06-30 LAB — LACTOFERRIN, FECAL, QUALITATIVE: Lactoferrin, Fecal, Qual: POSITIVE — AB

## 2022-06-30 LAB — PHOSPHORUS: Phosphorus: 6 mg/dL — ABNORMAL HIGH (ref 2.5–4.6)

## 2022-06-30 LAB — ALBUMIN, PLEURAL OR PERITONEAL FLUID: Albumin, Fluid: 1.7 g/dL

## 2022-06-30 LAB — MAGNESIUM: Magnesium: 2.3 mg/dL (ref 1.7–2.4)

## 2022-06-30 LAB — HEPATITIS B SURFACE ANTIBODY, QUANTITATIVE: Hep B S AB Quant (Post): 22 m[IU]/mL (ref >9.9)

## 2022-06-30 LAB — PROTEIN, PLEURAL OR PERITONEAL FLUID: Total protein, fluid: 3.3 g/dL

## 2022-06-30 MED ORDER — TETRACAINE HCL 1 % IJ SOLN
100.0000 mg | Freq: Once | INTRAMUSCULAR | Status: AC
  Administered 2022-06-30: 10 mL
  Filled 2022-06-30 (×2): qty 10

## 2022-06-30 MED ORDER — HEPARIN SODIUM (PORCINE) 5000 UNIT/ML IJ SOLN
5000.0000 [IU] | Freq: Three times a day (TID) | INTRAMUSCULAR | Status: DC
  Administered 2022-06-30 – 2022-07-01 (×3): 5000 [IU] via SUBCUTANEOUS
  Filled 2022-06-30 (×3): qty 1

## 2022-06-30 MED ORDER — SACUBITRIL-VALSARTAN 24-26 MG PO TABS
1.0000 | ORAL_TABLET | Freq: Two times a day (BID) | ORAL | Status: DC
  Administered 2022-07-01: 1 via ORAL
  Filled 2022-06-30: qty 1

## 2022-06-30 MED ORDER — MUPIROCIN 2 % EX OINT
1.0000 | TOPICAL_OINTMENT | Freq: Two times a day (BID) | CUTANEOUS | Status: DC
  Administered 2022-06-30 – 2022-07-01 (×2): 1 via NASAL
  Filled 2022-06-30 (×3): qty 22

## 2022-06-30 NOTE — Progress Notes (Addendum)
Daily Progress Note Intern Pager: [phone]  Patient name: Albert Harris Medical record number: [id number] Date of birth: [birth year] Age: 67 y.o. Gender: male  Primary Care Provider: Kerin Perna, NP Consultants: Nephrology, GI Code Status: Full  Pt Overview and Major Events to Date:  1/21: Admitted 1/22: 1st day of HD, tolerated well 1/23: C. diff positive>started Tx; diagnostic paracentesis  Assessment and Plan: Albert Harris is a 67 y.o. male presenting with fluid overload (ESRD, missed dialysis for 2 weeks), diarrhea, new jaundice and cirrhosis findings, and back/neck pain (c/w spinal stenosis).   Pertinent PMH/PSH includes HFrEF (EF 35-40), ESRD on dialysis, COPD, HTN, PAD, R leg lymphedema, and Hx stroke.  * ESRD (end stage renal disease) on dialysis ([place]) Received dialysis yesterday (tolerated well) and planned again today.  Then, start TTS schedule per nephrology.  -1.5L net I/O in past 24 hours (2 L was removed at dialysis). - AM CMP, Phos, Mg, CBC - Nephrology consulted for dialysis. Appreciate recs:  - Renal diet  - TTS schedule for dialysis after today  - VTE PPx per primary team  - ESA for likely anemia of chronic disease  - Outpatient orders copied to d/c summary  Diarrhea C. diff antigen and PCR positive, Abx started overnight.  Positive FOBT and lactoferrin.  GI onboard, monitoring.  3 bowel movements in past day. - Fidaxomicin 200 mg PO BID (day 1 of 10)  Cirrhosis of liver (HCC) GI consulted, have provided recommendations.  Believe patient is encephalopathic given asterixis and expressive aphasia.  Considering cirrhosis d/t alcohol use, heart failure, or autoimmune sources.  Hepatitis labs pending, appears less likely. - Protonix 40 IV BID - AM CBC, CMP - GI consult, appreciate recs:  - f/u ANA, AMA, ceruloplasmin  - Diagnostic paracentesis w/ albumin, protein SAAG, and cell count w/ diff  - Serum albumin same day  - Rifaximin 550 mg PO BID,  hold lactulose  HFrEF (heart failure with reduced ejection fraction) (HCC) Echo with unchanged EF, G1DD, and biatrial dilation.  Not on GDMT given ESRD, is on metoprolol for mortality benefit.  Starting [facility] today, confirmed acceptable with nephrology. - Continue metoprolol 25 mg daily - Start Entresto 24-26 mg BID  Hypertension BP improved after HD, though still somewhat hypertensive.  Will reassess after repeat HD today. - Continue amlodipine 10 mg daily  Lymphedema Hx of lymphedema in R LE from cellulitis.  Continues to have significant RLE edema, but is chronic with low concern for DVT at this time. - Consider DVT US if resp status worsens or tachycardia develops  Spinal stenosis of cervical region Has 5-7wk hx of back/neck pain after a fall, follows with ortho and has myelopathy Dx.  Remains tender to palpation in cervical paraspinal region, R>L.  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  OT signed off, no services needed. - Given ERSD, holding gabapentin - Tylenol PRN - PT evaluation - f/u with Ortho who ordered 1/18 MRI C spine concerning findings  Chronic and stable: HFrEF: Cont metoprolol COPD: Not taking home albuterol PRN  FEN/GI: Renal diet VTE PPx: Subcutaneous heparin Dispo: Home pending clinical improvement . Barriers include paracentesis, cirrhosis workup, HD schedule.  Subjective:  This morning, patient reports that he is feeling well overall with easier breathing and he is eager to go for a walk.  He inquires when he can go home.  He says that dialysis yesterday went well and that he has less abdominal pain  today.  Reports that he has had 3 bowel movements in the past day with liquid consistency.  Denies chest pain, palpitations.  Ambulated well with nursing staff this morning.  Denies nausea/vomiting.  Objective: Temp:  [97.8 F (36.6 C)-99.4 F (37.4 C)] 97.8 F (36.6 C) (01/23 0857) Pulse Rate:  [71-88] 76 (01/23  0857) Resp:  [17-27] 18 (01/23 0857) BP: (131-175)/(63-132) 170/77 (01/23 0857) SpO2:  [93 %-100 %] 98 % (01/23 0857) Weight:  [66.4 kg-68.7 kg] 66.4 kg (01/23 0600)  Physical Exam: General: Age-appropriate, sitting on edge of bed comfortably. Alert and oriented to self, place, month, and situation. HEET: MMM. Mild yellowing under tongue. Sparse dentition. PERRLA. Neck: Paraspinal tenderness (R>L). Cardiovascular: Regular rate and rhythm. Normal S1/S2. No murmurs, rubs, or gallops appreciated. 2+ radial pulses. Pulmonary: Diminished breath sounds bilaterally.  Clear bilaterally to ascultation. No increased WOB on room air. No wheezes, rales, or crackles. Abdominal: Normoactive bowel sounds. No tenderness to deep or light palpation. No rebound or guarding. No HSM. Skin: Warm and dry. No rashes or telangiectasias. Extremities: R>>L pitting edema, chronic.  No pain with palpation of RLE.  Pedal pulses 2+ bilaterally. Neuro: Asterixis with sustained handgrip of pen. Possible mildly slowed cognition. Repeats self.  Laboratory: Most recent CBC Lab Results  Component Value Date   WBC 8.4 06/29/2022   HGB 8.6 (L) 06/29/2022   HCT 26.9 (L) 06/29/2022   MCV 100.7 (H) 06/29/2022   PLT 123 (L) 06/29/2022   Most recent BMP    Latest Ref Rng & Units 06/30/2022    8:15 AM  BMP  Glucose 70 - 99 mg/dL 111   BUN 8 - 23 mg/dL 49   Creatinine 0.61 - 1.24 mg/dL 16.43   Sodium 135 - 145 mmol/L 139   Potassium 3.5 - 5.1 mmol/L 3.1   Chloride 98 - 111 mmol/L 99   CO2 22 - 32 mmol/L 26   Calcium 8.9 - 10.3 mg/dL 8.8     Other pertinent labs: - C. 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 I have discussed the above with the original author and agree with their documentation. My edits for correction/addition/clarification are in within the document. Please see also any attending notes.   Rise Patience, DO  PGY-3, [facility] 06/30/2022 11:59 AM  FPTS Service pager: [phone] (text pages welcome through [place])

## 2022-06-30 NOTE — Progress Notes (Signed)
Contacted clinic social worker at [place]. Social worker confirms that pt has access to transportation to/from HD through [facility]. Pt's wife advised clinic staff that she assists with arranging transportation appts but pt decides if/when he will attend HD appts.   Melven Sartorius Renal Navigator [phone]

## 2022-06-30 NOTE — Progress Notes (Addendum)
Daily Rounding Note  06/30/2022, 10:52 AM  LOS: 0 days   SUBJECTIVE:   Chief complaint:      Says the diarrhea is better.  Had a small loose stool this morning.  OBJECTIVE:         Vital signs in last 24 hours:    Temp:  [97.8 F (36.6 C)-99.4 F (37.4 C)] 97.8 F (36.6 C) (01/23 0857) Pulse Rate:  [71-88] 76 (01/23 0857) Resp:  [17-27] 18 (01/23 0857) BP: (131-175)/(63-132) 170/77 (01/23 0857) SpO2:  [93 %-100 %] 98 % (01/23 0857) Weight:  [66.4 kg-68.7 kg] 66.4 kg (01/23 0600) Last BM Date : 06/30/22 Filed Weights   06/29/22 0914 06/29/22 1215 06/30/22 0600  Weight: 70.9 kg 68.7 kg 66.4 kg   General: Looks better today.  More alert.  Remains comfortable and nontoxic-appearing but somewhat chronically ill. Heart: RRR. Chest: No labored breathing or cough. Abdomen: Not tender, not distended.  No HSM, masses, bruits, hernias. Extremities: No CCE. Neuro/Psych: Appropriate.  No asterixis.  No gross deficits.  No word finding problems or loss of concentration as was present yesterday.  Intake/Output from previous day: 01/22 0701 - 01/23 0700 In: 480 [P.O.:480] Out: 2000   Intake/Output this shift: Total I/O In: 377 [P.O.:377] Out: -   Lab Results: Recent Labs    06/28/22 1340 06/29/22 0438  WBC 9.6 8.4  HGB 8.9* 8.6*  HCT 27.6* 26.9*  PLT 144* 123*   BMET Recent Labs    06/28/22 1340 06/29/22 0438 06/30/22 0815  NA 139 140 139  K 3.6 3.8 3.1*  CL 98 97* 99  CO2 18* 15* 26  GLUCOSE 102* 92 111*  BUN 105* 111* 49*  CREATININE 30.01* 29.32* 16.43*  CALCIUM 9.5 9.1 8.8*   LFT Recent Labs    06/28/22 1340 06/29/22 0438 06/30/22 0815  PROT 7.3 7.1 6.4*  ALBUMIN 3.3* 3.3* 2.9*  AST 11* 10* 12*  ALT 7 8 10   ALKPHOS 76 74 73  BILITOT 0.9 0.9 0.9   PT/INR Recent Labs    06/28/22 1500 06/29/22 0438  LABPROT 16.1* 16.5*  INR 1.3* 1.3*   Hepatitis Panel Recent Labs    06/28/22 1640   HEPBSAG NON REACTIVE    Studies/Results: ECHOCARDIOGRAM COMPLETE  Result Date: 06/29/2022    ECHOCARDIOGRAM REPORT   Patient Name:   JONTEZ REDFIELD Date of Exam: 06/29/2022 Medical Rec #:  [id number]      Height:       68.0 in Accession #:    [id number]     Weight:       151.5 lb Date of Birth:  1955/08/31     BSA:          1.816 m Patient Age:    67 years       BP:           176/80 mmHg Patient Gender: M              HR:           84 bpm. Exam Location:  Inpatient Procedure: 2D Echo, Cardiac Doppler and Color Doppler Indications:    CHF-Acute Systolic A35.57  History:        Patient has prior history of Echocardiogram examinations, most                 recent 05/09/2021. CHF, [name], PAD and COPD,  Signs/Symptoms:Shortness of Breath; Risk Factors:Hypertension,                 Current Smoker and Dyslipidemia. ESRD.  Sonographer:    Ronny Flurry Referring Phys: [id number] [place]  1. Left ventricular ejection fraction, by estimation, is 35 to 40%. The left ventricle has moderately decreased function. The left ventricle demonstrates regional wall motion abnormalities (see scoring diagram/findings for description). There is moderate concentric left ventricular hypertrophy. Left ventricular diastolic parameters are consistent with Grade I diastolic dysfunction (impaired relaxation). Elevated left ventricular end-diastolic pressure.  2. Right ventricular systolic function is normal. The right ventricular size is normal. There is severely elevated pulmonary artery systolic pressure.  3. Left atrial size was severely dilated.  4. Right atrial size was severely dilated.  5. The mitral valve is myxomatous. Mild mitral valve regurgitation. No evidence of mitral stenosis.  6. The aortic valve is normal in structure. There is mild calcification of the aortic valve. There is mild thickening of the aortic valve. Aortic valve regurgitation is not visualized. Aortic valve  sclerosis/calcification is present, without any evidence of aortic stenosis. Aortic valve area, by VTI measures 2.74 cm. Aortic valve mean gradient measures 9.0 mmHg. Aortic valve Vmax measures 2.04 m/s.  7. The inferior vena cava is normal in size with <50% respiratory variability, suggesting right atrial pressure of 8 mmHg. FINDINGS  Left Ventricle: Left ventricular ejection fraction, by estimation, is 35 to 40%. The left ventricle has moderately decreased function. The left ventricle demonstrates regional wall motion abnormalities. The left ventricular internal cavity size was normal in size. There is moderate concentric left ventricular hypertrophy. Left ventricular diastolic parameters are consistent with Grade I diastolic dysfunction (impaired relaxation). Elevated left ventricular end-diastolic pressure.  LV Wall Scoring: The anterior wall, antero-lateral wall, posterior wall, basal anteroseptal segment, and basal inferior segment are hypokinetic. The mid and distal anterior septum, inferior septum, entire apex, and mid and distal inferior wall are normal. Right Ventricle: The right ventricular size is normal. No increase in right ventricular wall thickness. Right ventricular systolic function is normal. There is severely elevated pulmonary artery systolic pressure. The tricuspid regurgitant velocity is 4.10 m/s, and with an assumed right atrial pressure of 8 mmHg, the estimated right ventricular systolic pressure is 30.0 mmHg. Left Atrium: Left atrial size was severely dilated. Right Atrium: Right atrial size was severely dilated. Pericardium: There is no evidence of pericardial effusion. Mitral Valve: The mitral valve is myxomatous. Mild mitral valve regurgitation. No evidence of mitral valve stenosis. Tricuspid Valve: The tricuspid valve is normal in structure. Tricuspid valve regurgitation is mild . No evidence of tricuspid stenosis. Aortic Valve: The aortic valve is normal in structure. There is mild  calcification of the aortic valve. There is mild thickening of the aortic valve. Aortic valve regurgitation is not visualized. Aortic valve sclerosis/calcification is present, without any evidence of aortic stenosis. Aortic valve mean gradient measures 9.0 mmHg. Aortic valve peak gradient measures 16.6 mmHg. Aortic valve area, by VTI measures 2.74 cm. Pulmonic Valve: The pulmonic valve was normal in structure. Pulmonic valve regurgitation is not visualized. No evidence of pulmonic stenosis. Aorta: The aortic root is normal in size and structure. Venous: The inferior vena cava is normal in size with less than 50% respiratory variability, suggesting right atrial pressure of 8 mmHg. IAS/Shunts: No atrial level shunt detected by color flow Doppler.  LEFT VENTRICLE PLAX 2D LVIDd:         5.00 cm   Diastology  LVIDs:         4.30 cm   LV e' medial:    5.11 cm/s LV PW:         1.50 cm   LV E/e' medial:  24.3 LV IVS:        1.50 cm   LV e' lateral:   6.20 cm/s LVOT diam:     2.10 cm   LV E/e' lateral: 20.0 LV SV:         96 LV SV Index:   53 LVOT Area:     3.46 cm  RIGHT VENTRICLE            IVC RV S prime:     6.64 cm/s  IVC diam: 1.90 cm TAPSE (M-mode): 1.6 cm LEFT ATRIUM             Index        RIGHT ATRIUM           Index LA diam:        5.30 cm 2.92 cm/m   RA Area:     25.80 cm LA Vol (A2C):   91.1 ml 50.17 ml/m  RA Volume:   88.40 ml  48.68 ml/m LA Vol (A4C):   77.3 ml 42.57 ml/m LA Biplane Vol: 87.6 ml 48.24 ml/m  AORTIC VALVE AV Area (Vmax):    2.69 cm AV Area (Vmean):   2.50 cm AV Area (VTI):     2.74 cm AV Vmax:           204.00 cm/s AV Vmean:          139.000 cm/s AV VTI:            0.350 m AV Peak Grad:      16.6 mmHg AV Mean Grad:      9.0 mmHg LVOT Vmax:         158.33 cm/s LVOT Vmean:        100.167 cm/s LVOT VTI:          0.277 m LVOT/AV VTI ratio: 0.79  AORTA Ao Root diam: 3.20 cm Ao Asc diam:  3.50 cm MITRAL VALVE                TRICUSPID VALVE MV Area (PHT): 4.68 cm     TR Peak grad:   67.2  mmHg MV Decel Time: 162 msec     TR Vmax:        410.00 cm/s MV E velocity: 124.00 cm/s MV A velocity: 127.00 cm/s  SHUNTS MV E/A ratio:  0.98         Systemic VTI:  0.28 m                             Systemic Diam: 2.10 cm Skeet Latch MD Electronically signed by Skeet Latch MD Signature Date/Time: 06/29/2022/10:37:05 PM    Final    US Abdomen Limited RUQ (LIVER/GB)  Result Date: 06/28/2022 CLINICAL DATA:  Jaundice EXAM: ULTRASOUND ABDOMEN LIMITED RIGHT UPPER QUADRANT COMPARISON:  CT abdomen and pelvis 01/01/2022 FINDINGS: Gallbladder: No gallstones. There is gallbladder wall thickening measuring up to 3.8 mm. No sonographic Murphy sign noted by sonographer. Common bile duct: Diameter: 4 mm Liver: No focal lesion identified. There is nodular liver contour. There is increased in parenchymal echogenicity. Portal vein is patent on color Doppler imaging with normal direction of blood flow towards the liver. Moderate amount of ascites in the right upper  quadrant. IMPRESSION: 1. Cirrhotic liver morphology. 2. Moderate amount of ascites in the right upper quadrant. 3. Gallbladder wall thickening measuring up to 3.8 mm. No gallstones or sonographic Murphy sign. Electronically Signed   By: Ronney Asters M.D.   On: 06/28/2022 15:14   DG Chest Portable 1 View  Result Date: 06/28/2022 CLINICAL DATA:  Weakness EXAM: PORTABLE CHEST 1 VIEW COMPARISON:  Chest radiograph 12/31/2021 FINDINGS: Monitoring leads overlie the patient. Stable cardiomegaly. Aortic atherosclerosis. No large area pulmonary consolidation. No pleural effusion or pneumothorax. Thoracic spine degenerative changes. IMPRESSION: Cardiomegaly. No acute cardiopulmonary process. Electronically Signed   By: Lovey Newcomer M.D.   On: 06/28/2022 15:01    Scheduled Meds:  amLODipine  10 mg Oral Daily   calcium acetate  1,334 mg Oral TID WC   Chlorhexidine Gluconate Cloth  6 each Topical Q0600   darbepoetin (ARANESP) injection - DIALYSIS  100 mcg  Subcutaneous Q Tue-1800   feeding supplement (NEPRO CARB STEADY)  237 mL Oral BID BM   fidaxomicin  200 mg Oral BID   heparin injection (subcutaneous)  5,000 Units Subcutaneous Q8H   metoprolol succinate  25 mg Oral Daily   pantoprazole (PROTONIX) IV  40 mg Intravenous Q12H   rifaximin  550 mg Oral BID   tetracaine  100 mg Infiltration Once   Continuous Infusions: PRN Meds:.acetaminophen **OR** acetaminophen   ASSESMENT:   New diagnosis cirrhosis.  Hep B and C serologies negative.  ANA, IgG, ceruloplasmin, smooth muscle antibody, mitochondrial antibody are all pending.  Diarrhea.  Toxigenic C. difficile by PCR is positive.  Fecal lactoferrin positive.  Stool path PCR negative.  Day 2/10 fidaxomicin.  Coagulopathy.  Stable INR 1.3.     Moderate ascites.  Just now headed to IR for paracentesis  Noncritical thrombocytopenia.    Confusion, asterixis resolved.  Ammonia never elevated.  Received rifaximin yesterday evening and this morning though not clear he has/had HE.    ESRD.  Missed hemodialysis for 2 weeks.  Dialyzed yesterday  CHF.  2D echo yesterday with EF 35 to 40%.  Grade 1 diastolic dysfunction/impaired relaxation.  Severe biatrial dilation.  No significant valvular disease or regurgitation.  Myelopathy.  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  PLAN   Ultrasound paracentesis day.  Just called to add cytology on orders for cell count, differential, albumin and protein  Complete 10-day course of Dificid.    Stopped rifaximin.  Stopped IV protonix.  ? Initiate lasix 20 mg, aldactone 50 mg daily, will dw Dr Silverio Decamp.      Azucena Freed  06/30/2022, 10:52 AM Phone [phone]   Attending physician's note   I have taken a history, reviewed the chart and examined the patient. I performed a substantive portion of this encounter, including complete performance of at least one of the key components, in  conjunction with the APP. I agree with the APP's note, impression and recommendations.    C. difficile positive, likely etiology for diarrhea Continue Dificid 200 mg twice daily for 10 days  Echocardiogram with EF 69-62%, diastolic dysfunction, severe biatrial dilation  Ascites fluid with high-protein >2.5g/dl and SAAG >1.1 consistent with cardiac ascites  Follow-up with cardiology for management of CHF Will defer fluid management to nephrology and cardiology  GI will sign off, available if have any questions    K. Denzil Magnuson , MD [phone]

## 2022-06-30 NOTE — Progress Notes (Signed)
Physical Therapy Treatment Patient Details Name: Albert Harris MRN: [id number] DOB: [birth year] Today's Date: 06/30/2022   History of Present Illness [name] admitted for ESRD (missed HD for 2 weeks), diarrhea, and jaundice in setting of cirrhosis. cdiff +. PMH includes s/p 3/31 R CFA endarterectomy and femoral to popliteal bypass with vein, recent history of DVT, R great toe wound, ESRD on HD TTS, severe PAD, hypertension, COPD with tobacco abuse, hyperlipidemia and HFrEF.    PT Comments    Pt with much improved tolerance for activity this date, ambulating hallway without AD. Pt does demonstrate impaired dynamic standing balance and lacks awareness of this, pt weaving L and R frequently in hallway. PT feels [name] to address balance and strength deficits, will continue to follow.      Recommendations for follow up therapy are one component of a multi-disciplinary discharge planning process, led by the attending physician.  Recommendations may be updated based on patient status, additional functional criteria and insurance authorization.  Follow Up Recommendations  Outpatient PT     Assistance Recommended at Discharge Intermittent Supervision/Assistance  Patient can return home with the following A little help with walking and/or transfers;A little help with bathing/dressing/bathroom;Assistance with cooking/housework;Assist for transportation;Help with stairs or ramp for entrance   Equipment Recommendations   (TBD, may need RW - will assess when pt more alert.)    Recommendations for Other Services       Precautions / Restrictions Precautions Precautions: Fall Restrictions Weight Bearing Restrictions: No     Mobility  Bed Mobility Overal bed mobility: Modified Independent             General bed mobility comments: use of rails and HOB elevation    Transfers Overall transfer level: Needs assistance Equipment used: Rolling  walker (2 wheels) Transfers: Sit to/from Stand Sit to Stand: Supervision           General transfer comment: for safety, STS x3 from EOB x2 and toilet x1    Ambulation/Gait Ambulation/Gait assistance: Min guard Gait Distance (Feet): 350 Feet Assistive device: None Gait Pattern/deviations: Step-through pattern, Decreased stride length, Drifts right/left, Staggering right, Staggering left Gait velocity: decr     General Gait Details: close guard for safety, pt often veering L or R with head turns or distractions, then pt acts as if it is intentional. Cues for hallway navigation and avoiding running into objects   Stairs Stairs: Yes Stairs assistance: Min guard Stair Management: No rails, Forwards, Step to pattern Number of Stairs: 3 General stair comments: for safety, no rail use given CDIFF precautions. Increased time, mildly unsteady, will have wife to guard him at home   Wheelchair Mobility    Modified Rankin (Stroke Patients Only)       Balance Overall balance assessment: Needs assistance Sitting-balance support: No upper extremity supported, Feet supported Sitting balance-Leahy Scale: Good     Standing balance support: No upper extremity supported, During functional activity Standing balance-Leahy Scale: Fair                              Cognition Arousal/Alertness: Awake/alert Behavior During Therapy: WFL for tasks assessed/performed Overall Cognitive Status: Within Functional Limits for tasks assessed                                 General Comments: lacks  some insight into deficits        Exercises      General Comments        Pertinent Vitals/Pain Pain Assessment Pain Assessment: No/denies pain    Home Living                          Prior Function            PT Goals (current goals can now be found in the care plan section) Acute Rehab PT Goals Patient Stated Goal: go home soon PT Goal  Formulation: With patient Time For Goal Achievement: 07/06/22 Potential to Achieve Goals: Good Progress towards PT goals: Progressing toward goals    Frequency    Min 3X/week      PT Plan Current plan remains appropriate    Co-evaluation              AM-PAC PT "6 Clicks" Mobility   Outcome Measure  Help needed turning from your back to your side while in a flat bed without using bedrails?: None Help needed moving from lying on your back to sitting on the side of a flat bed without using bedrails?: A Little Help needed moving to and from a bed to a chair (including a wheelchair)?: A Little Help needed standing up from a chair using your arms (e.g., wheelchair or bedside chair)?: A Little Help needed to walk in hospital room?: A Little Help needed climbing 3-5 steps with a railing? : A Little 6 Click Score: 19    End of Session   Activity Tolerance: Patient tolerated treatment well Patient left: in bed;with call bell/phone within reach;with bed alarm set Nurse Communication: Mobility status PT Visit Diagnosis: Unsteadiness on feet (R26.81);Other abnormalities of gait and mobility (R26.89);Muscle weakness (generalized) (M62.81);History of falling (Z91.81);Difficulty in walking, not elsewhere classified (R26.2)     Time: 9242-6834 PT Time Calculation (min) (ACUTE ONLY): 16 min  Charges:  $Gait Training: 8-22 mins                    Stacie Glaze, PT DPT Acute Rehabilitation Services Pager [phone]  Office [phone]    Louis Matte 06/30/2022, 9:38 AM

## 2022-06-30 NOTE — TOC Benefit Eligibility Note (Addendum)
Patient Teacher, English as a foreign language completed.    The patient is currently admitted and upon discharge could be taking Entresto 24-26 mg.  The current 30 day co-pay is $47.00.   The patient is currently admitted and upon discharge could be taking Dificid 200 mg.  The current 10 day co-pay is $1,652.13.   The patient is insured through [place], [place] Patient Advocate Specialist [place] Patient Advocate Team Direct Number: [phone]  Fax: [phone]

## 2022-06-30 NOTE — Progress Notes (Signed)
[facility] Progress Note    Assessment/ Plan:   ESRD -Outpatient orders: [facility], TTS, 4 hours.  F1 80.  Flow rates: 350/800.  EDW 69 kg.  2K/2 calcium.  UF profile 2.  Meds: Venofer 50 mg once weekly (last dose 06/11/2022), Korsuva 0.7 mL every treatment Mircera 75 mcg every 2 weeks (last dose on 05/26/2022).  No heparin -s/p HD on 1/22. HD today, TTS thereafter   Cirrhosis -new finding, GI consulted   Neck pain, cervical spinal stenosis -per primary service   Volume/ hypertension:  -will UF as tolerated   Anemia of Chronic Kidney Disease:  -Hemoglobin 8.6. Restarted ESA   Secondary Hyperparathyroidism/Hyperphosphatemia:  -restarted home binders    [name], MD [facility]  Subjective:   Patient seen and examined bedside. No acute events overnight, no complaints.   Objective:   BP (!) 170/77 (BP Location: Left Arm)   Pulse 76   Temp 97.8 F (36.6 C) (Oral)   Resp 18   Ht 5\' 8"  (1.727 m)   Wt 66.4 kg   SpO2 98%   BMI 22.26 kg/m   Intake/Output Summary (Last 24 hours) at 06/30/2022 0944 Last data filed at 06/30/2022 1607 Gross per 24 hour  Intake 377 ml  Output 2000 ml  Net -1623 ml   Weight change: -1.675 kg  Physical Exam: Gen:NAD, laying flat in bed CVS: RRR Resp: CTA B/L Abd: soft, ND Ext: minimal/trace edema b/l Les Neuro: awake, alert Dialysis access: RUE AVG +b/t  Imaging: ECHOCARDIOGRAM COMPLETE  Result Date: 06/29/2022    ECHOCARDIOGRAM REPORT   Patient Name:   MALIKIAH DEBARR Date of Exam: 06/29/2022 Medical Rec #:  [id number]      Height:       68.0 in Accession #:    [id number]     Weight:       151.5 lb Date of Birth:  1956/04/24     BSA:          1.816 m Patient Age:    67 years       BP:           176/80 mmHg Patient Gender: M              HR:           84 bpm. Exam Location:  Inpatient Procedure: 2D Echo, Cardiac Doppler and Color Doppler Indications:    CHF-Acute Systolic K93.81  History:        Patient  has prior history of Echocardiogram examinations, most                 recent 05/09/2021. CHF, [name], PAD and COPD,                 Signs/Symptoms:Shortness of Breath; Risk Factors:Hypertension,                 Current Smoker and Dyslipidemia. ESRD.  Sonographer:    Ronny Flurry Referring Phys: [id number] [place]  1. Left ventricular ejection fraction, by estimation, is 35 to 40%. The left ventricle has moderately decreased function. The left ventricle demonstrates regional wall motion abnormalities (see scoring diagram/findings for description). There is moderate concentric left ventricular hypertrophy. Left ventricular diastolic parameters are consistent with Grade I diastolic dysfunction (impaired relaxation). Elevated left ventricular end-diastolic pressure.  2. Right ventricular systolic function is normal. The right ventricular size is normal. There is severely elevated pulmonary artery systolic pressure.  3. Left atrial size  was severely dilated.  4. Right atrial size was severely dilated.  5. The mitral valve is myxomatous. Mild mitral valve regurgitation. No evidence of mitral stenosis.  6. The aortic valve is normal in structure. There is mild calcification of the aortic valve. There is mild thickening of the aortic valve. Aortic valve regurgitation is not visualized. Aortic valve sclerosis/calcification is present, without any evidence of aortic stenosis. Aortic valve area, by VTI measures 2.74 cm. Aortic valve mean gradient measures 9.0 mmHg. Aortic valve Vmax measures 2.04 m/s.  7. The inferior vena cava is normal in size with <50% respiratory variability, suggesting right atrial pressure of 8 mmHg. FINDINGS  Left Ventricle: Left ventricular ejection fraction, by estimation, is 35 to 40%. The left ventricle has moderately decreased function. The left ventricle demonstrates regional wall motion abnormalities. The left ventricular internal cavity size was normal in size. There is  moderate concentric left ventricular hypertrophy. Left ventricular diastolic parameters are consistent with Grade I diastolic dysfunction (impaired relaxation). Elevated left ventricular end-diastolic pressure.  LV Wall Scoring: The anterior wall, antero-lateral wall, posterior wall, basal anteroseptal segment, and basal inferior segment are hypokinetic. The mid and distal anterior septum, inferior septum, entire apex, and mid and distal inferior wall are normal. Right Ventricle: The right ventricular size is normal. No increase in right ventricular wall thickness. Right ventricular systolic function is normal. There is severely elevated pulmonary artery systolic pressure. The tricuspid regurgitant velocity is 4.10 m/s, and with an assumed right atrial pressure of 8 mmHg, the estimated right ventricular systolic pressure is 53.6 mmHg. Left Atrium: Left atrial size was severely dilated. Right Atrium: Right atrial size was severely dilated. Pericardium: There is no evidence of pericardial effusion. Mitral Valve: The mitral valve is myxomatous. Mild mitral valve regurgitation. No evidence of mitral valve stenosis. Tricuspid Valve: The tricuspid valve is normal in structure. Tricuspid valve regurgitation is mild . No evidence of tricuspid stenosis. Aortic Valve: The aortic valve is normal in structure. There is mild calcification of the aortic valve. There is mild thickening of the aortic valve. Aortic valve regurgitation is not visualized. Aortic valve sclerosis/calcification is present, without any evidence of aortic stenosis. Aortic valve mean gradient measures 9.0 mmHg. Aortic valve peak gradient measures 16.6 mmHg. Aortic valve area, by VTI measures 2.74 cm. Pulmonic Valve: The pulmonic valve was normal in structure. Pulmonic valve regurgitation is not visualized. No evidence of pulmonic stenosis. Aorta: The aortic root is normal in size and structure. Venous: The inferior vena cava is normal in size with less  than 50% respiratory variability, suggesting right atrial pressure of 8 mmHg. IAS/Shunts: No atrial level shunt detected by color flow Doppler.  LEFT VENTRICLE PLAX 2D LVIDd:         5.00 cm   Diastology LVIDs:         4.30 cm   LV e' medial:    5.11 cm/s LV PW:         1.50 cm   LV E/e' medial:  24.3 LV IVS:        1.50 cm   LV e' lateral:   6.20 cm/s LVOT diam:     2.10 cm   LV E/e' lateral: 20.0 LV SV:         96 LV SV Index:   53 LVOT Area:     3.46 cm  RIGHT VENTRICLE            IVC RV S prime:     6.64 cm/s  IVC diam: 1.90 cm TAPSE (M-mode): 1.6 cm LEFT ATRIUM             Index        RIGHT ATRIUM           Index LA diam:        5.30 cm 2.92 cm/m   RA Area:     25.80 cm LA Vol (A2C):   91.1 ml 50.17 ml/m  RA Volume:   88.40 ml  48.68 ml/m LA Vol (A4C):   77.3 ml 42.57 ml/m LA Biplane Vol: 87.6 ml 48.24 ml/m  AORTIC VALVE AV Area (Vmax):    2.69 cm AV Area (Vmean):   2.50 cm AV Area (VTI):     2.74 cm AV Vmax:           204.00 cm/s AV Vmean:          139.000 cm/s AV VTI:            0.350 m AV Peak Grad:      16.6 mmHg AV Mean Grad:      9.0 mmHg LVOT Vmax:         158.33 cm/s LVOT Vmean:        100.167 cm/s LVOT VTI:          0.277 m LVOT/AV VTI ratio: 0.79  AORTA Ao Root diam: 3.20 cm Ao Asc diam:  3.50 cm MITRAL VALVE                TRICUSPID VALVE MV Area (PHT): 4.68 cm     TR Peak grad:   67.2 mmHg MV Decel Time: 162 msec     TR Vmax:        410.00 cm/s MV E velocity: 124.00 cm/s MV A velocity: 127.00 cm/s  SHUNTS MV E/A ratio:  0.98         Systemic VTI:  0.28 m                             Systemic Diam: 2.10 cm Skeet Latch MD Electronically signed by Skeet Latch MD Signature Date/Time: 06/29/2022/10:37:05 PM    Final    US Abdomen Limited RUQ (LIVER/GB)  Result Date: 06/28/2022 CLINICAL DATA:  Jaundice EXAM: ULTRASOUND ABDOMEN LIMITED RIGHT UPPER QUADRANT COMPARISON:  CT abdomen and pelvis 01/01/2022 FINDINGS: Gallbladder: No gallstones. There is gallbladder wall thickening  measuring up to 3.8 mm. No sonographic Murphy sign noted by sonographer. Common bile duct: Diameter: 4 mm Liver: No focal lesion identified. There is nodular liver contour. There is increased in parenchymal echogenicity. Portal vein is patent on color Doppler imaging with normal direction of blood flow towards the liver. Moderate amount of ascites in the right upper quadrant. IMPRESSION: 1. Cirrhotic liver morphology. 2. Moderate amount of ascites in the right upper quadrant. 3. Gallbladder wall thickening measuring up to 3.8 mm. No gallstones or sonographic Murphy sign. Electronically Signed   By: Ronney Asters M.D.   On: 06/28/2022 15:14   DG Chest Portable 1 View  Result Date: 06/28/2022 CLINICAL DATA:  Weakness EXAM: PORTABLE CHEST 1 VIEW COMPARISON:  Chest radiograph 12/31/2021 FINDINGS: Monitoring leads overlie the patient. Stable cardiomegaly. Aortic atherosclerosis. No large area pulmonary consolidation. No pleural effusion or pneumothorax. Thoracic spine degenerative changes. IMPRESSION: Cardiomegaly. No acute cardiopulmonary process. Electronically Signed   By: Lovey Newcomer M.D.   On: 06/28/2022 15:01    Labs: [employer] Recent Labs  Lab 06/28/22  1340 06/29/22 0438  NA 139 140  K 3.6 3.8  CL 98 97*  CO2 18* 15*  GLUCOSE 102* 92  BUN 105* 111*  CREATININE 30.01* 29.32*  CALCIUM 9.5 9.1  PHOS  --  11.6*   CBC Recent Labs  Lab 06/28/22 1340 06/29/22 0438  WBC 9.6 8.4  NEUTROABS 7.4  --   HGB 8.9* 8.6*  HCT 27.6* 26.9*  MCV 103.8* 100.7*  PLT 144* 123*    Medications:     amLODipine  10 mg Oral Daily   calcium acetate  1,334 mg Oral TID WC   Chlorhexidine Gluconate Cloth  6 each Topical Q0600   darbepoetin (ARANESP) injection - DIALYSIS  100 mcg Subcutaneous Q Tue-1800   feeding supplement (NEPRO CARB STEADY)  237 mL Oral BID BM   fidaxomicin  200 mg Oral BID   metoprolol succinate  25 mg Oral Daily   pantoprazole (PROTONIX) IV  40 mg Intravenous Q12H   rifaximin  550 mg  Oral BID      [name], MD [facility] 06/30/2022, 9:44 AM

## 2022-06-30 NOTE — Procedures (Signed)
PROCEDURE SUMMARY:  Successful image-guided paracentesis from the right upper abdomen.  Yielded 15 mL of hazy amber fluid.  No immediate complications.  EBL = trace. Patient tolerated well.   Specimen was  sent for labs.  Please see imaging section of Epic for full dictation.   Armando Gang Lacheryl Niesen PA-C 06/30/2022 11:39 AM

## 2022-06-30 NOTE — Telephone Encounter (Signed)
Patient Advocate Encounter  Patient is approved through the Clostridium Difficile Associated Diarrhea Copay Assistance for Dificid through The Assistance fund.      Lyndel Safe, [facility] Patient Advocate Specialist [place] Patient Advocate Team Direct Number: [phone]  Fax: [phone]

## 2022-06-30 NOTE — Progress Notes (Signed)
   06/30/22 1815  Vitals  Temp 98.6 F (37 C)  Pulse Rate 74  Resp (!) 24 (Simultaneous filing. User may not have seen previous data.)  BP (!) 153/60 (Simultaneous filing. User may not have seen previous data.)  SpO2 98 %  O2 Device Room Air  Weight 65.2 kg  Type of Weight Actual  Oxygen Therapy  Patient Activity (if Appropriate) In bed  Post Treatment  Dialyzer Clearance Clear  Duration of HD Treatment -hour(s) 3.5 hour(s)  Hemodialysis Intake (mL) 0 mL  Liters Processed 73.5  Fluid Removed (mL) 1900 mL  Tolerated HD Treatment Yes  AVG/AVF Arterial Site Held (minutes) 6 minutes  AVG/AVF Venous Site Held (minutes) 6 minutes   Received patient in bed to unit.  Alert and oriented.  Informed consent signed and in chart.   Treatment initiated: 1428 Treatment completed: 1615  Patient tolerated well.  Transported back to the room  Alert, without acute distress.  Hand-off given to patient's nurse.   Access used: RFA AVG Access issues: none  Total UF removed: 1.9L Medication(s) given: none    [facility]

## 2022-06-30 NOTE — Progress Notes (Signed)
Pt received in bed no c/os no distress noted stable for HD via RFA AVG UFG 3L

## 2022-07-01 ENCOUNTER — Encounter (HOSPITAL_COMMUNITY): Payer: Self-pay

## 2022-07-01 ENCOUNTER — Other Ambulatory Visit (HOSPITAL_COMMUNITY): Payer: Self-pay

## 2022-07-01 LAB — MAGNESIUM: Magnesium: 1.9 mg/dL (ref 1.7–2.4)

## 2022-07-01 LAB — CULTURE, BLOOD (SINGLE)

## 2022-07-01 LAB — ANTI-SMOOTH MUSCLE ANTIBODY, IGG: F-Actin IgG: 9 Units (ref 0–19)

## 2022-07-01 LAB — COMPREHENSIVE METABOLIC PANEL
ALT: 8 U/L (ref 0–44)
AST: 14 U/L — ABNORMAL LOW (ref 15–41)
Albumin: 2.9 g/dL — ABNORMAL LOW (ref 3.5–5.0)
Alkaline Phosphatase: 70 U/L (ref 38–126)
Anion gap: 13 (ref 5–15)
BUN: 23 mg/dL (ref 8–23)
CO2: 29 mmol/L (ref 22–32)
Calcium: 8.7 mg/dL — ABNORMAL LOW (ref 8.9–10.3)
Chloride: 96 mmol/L — ABNORMAL LOW (ref 98–111)
Creatinine, Ser: 8.36 mg/dL — ABNORMAL HIGH (ref 0.61–1.24)
GFR, Estimated: 6 mL/min — ABNORMAL LOW (ref >60)
Glucose, Bld: 94 mg/dL (ref 70–99)
Potassium: 3 mmol/L — ABNORMAL LOW (ref 3.5–5.1)
Sodium: 138 mmol/L (ref 135–145)
Total Bilirubin: 1 mg/dL (ref 0.3–1.2)
Total Protein: 6.5 g/dL (ref 6.5–8.1)

## 2022-07-01 LAB — CBC
HCT: 27.5 % — ABNORMAL LOW (ref 39.0–52.0)
Hemoglobin: 8.6 g/dL — ABNORMAL LOW (ref 13.0–17.0)
MCH: 31.9 pg (ref 26.0–34.0)
MCHC: 31.3 g/dL (ref 30.0–36.0)
MCV: 101.9 fL — ABNORMAL HIGH (ref 80.0–100.0)
Platelets: 124 10*3/uL — ABNORMAL LOW (ref 150–400)
RBC: 2.7 MIL/uL — ABNORMAL LOW (ref 4.22–5.81)
RDW: 15.1 % (ref 11.5–15.5)
WBC: 8.1 10*3/uL (ref 4.0–10.5)
nRBC: 0 % (ref 0.0–0.2)

## 2022-07-01 LAB — MITOCHONDRIAL ANTIBODIES: Mitochondrial M2 Ab, IgG: <20 Units (ref 0.0–20.0)

## 2022-07-01 LAB — CERULOPLASMIN: Ceruloplasmin: 27.8 mg/dL (ref 16.0–31.0)

## 2022-07-01 LAB — ANA: Anti Nuclear Antibody (ANA): NEGATIVE

## 2022-07-01 LAB — PHOSPHORUS: Phosphorus: 3.6 mg/dL (ref 2.5–4.6)

## 2022-07-01 LAB — IGG: IgG (Immunoglobin G), Serum: 1409 mg/dL (ref 603–1613)

## 2022-07-01 MED ORDER — METOPROLOL SUCCINATE ER 25 MG PO TB24
25.0000 mg | ORAL_TABLET | Freq: Every day | ORAL | 0 refills | Status: DC
  Filled 2022-07-01: qty 30, 30d supply, fill #0

## 2022-07-01 MED ORDER — POTASSIUM CHLORIDE CRYS ER 20 MEQ PO TBCR
20.0000 meq | EXTENDED_RELEASE_TABLET | Freq: Once | ORAL | Status: AC
  Administered 2022-07-01: 20 meq via ORAL
  Filled 2022-07-01: qty 1

## 2022-07-01 MED ORDER — FIDAXOMICIN 200 MG PO TABS
200.0000 mg | ORAL_TABLET | Freq: Two times a day (BID) | ORAL | 0 refills | Status: AC
  Filled 2022-07-01: qty 17, 9d supply, fill #0

## 2022-07-01 MED ORDER — SACUBITRIL-VALSARTAN 24-26 MG PO TABS
1.0000 | ORAL_TABLET | Freq: Two times a day (BID) | ORAL | 0 refills | Status: DC
  Filled 2022-07-01: qty 60, 30d supply, fill #0

## 2022-07-01 NOTE — Progress Notes (Signed)
Patient is ready for discharge he is only waiting on his daughter coming from [place].

## 2022-07-01 NOTE — Plan of Care (Signed)

## 2022-07-01 NOTE — Progress Notes (Signed)
[facility] Progress Note    Assessment/ Plan:   ESRD -Outpatient orders: [facility], TTS, 4 hours.  F1 80.  Flow rates: 350/800.  EDW 69 kg.  2K/2 calcium.  UF profile 2.  Meds: Venofer 50 mg once weekly (last dose 06/11/2022), Korsuva 0.7 mL every treatment Mircera 75 mcg every 2 weeks (last dose on 05/26/2022).  No heparin -will maintain HD on TTS schedule   Cirrhosis -new finding, GI consulted -ascites consistent with cardiac ascites. S/p para 1/23: 15cc removed  Cdiff, diarrhea -On dificid  HFrEF -entresto started per primary service. On metoprolol   Neck pain, cervical spinal stenosis -per primary service   Volume/ hypertension:  -will UF as tolerated   Anemia of Chronic Kidney Disease:  -Hemoglobin 8.6. on  ESA. Transfuse prn   Secondary Hyperparathyroidism/Hyperphosphatemia:  -restarted home binders    [name], MD [facility]  Subjective:   Patient seen and examined bedside. No acute events overnight, no complaints. Tolerate HD yesterday with net UF 1.9L. he reports that his RLE has always been more swollen than his left We discussed the importance of HD compliance and he reports that he is more aware and motivated to go to scheduled HD treatments   Objective:   BP (!) 168/85 (BP Location: Left Arm)   Pulse 80   Temp 98 F (36.7 C) (Oral)   Resp 18   Ht 5\' 8"  (1.727 m)   Wt 65.7 kg   SpO2 98%   BMI 22.02 kg/m   Intake/Output Summary (Last 24 hours) at 07/01/2022 9371 Last data filed at 06/30/2022 1815 Gross per 24 hour  Intake 613 ml  Output 1900 ml  Net -1287 ml   Weight change: -4.9 kg  Physical Exam: Gen:NAD, sitting up in bed eating breakfast CVS: RRR Resp: CTA B/L Abd: soft, ND Ext: RLE edema>LLE Neuro: awake, alert Dialysis access: RUE AVG +b/t  Imaging: IR Paracentesis  Result Date: 06/30/2022 INDICATION: 67 year old male with history of CHF and ESRD presents with abdominal distension, previous  ultrasound showed small ascites. Request for therapeutic and diagnostic paracentesis. EXAM: ULTRASOUND GUIDED  PARACENTESIS MEDICATIONS: 10 mL 1% tetracaine COMPLICATIONS: None immediate. PROCEDURE: Informed written consent was obtained from the patient after a discussion of the risks, benefits and alternatives to treatment. A timeout was performed prior to the initiation of the procedure. Initial ultrasound scanning demonstrates a small amount of ascites within the right upper abdominal quadrant. The right upper abdomen was prepped and draped in the usual sterile fashion. 1% lidocaine was used for local anesthesia. Following this, a 19 gauge, 7-cm, Yueh catheter was introduced. An ultrasound image was saved for documentation purposes. 15 mL of hazy amber colored fluid was aspirated when significant resistance was met and could not aspirate further. Ultrasound showed tiny ascites the end of Yueh catheter adjacent to bowel loop, attempted to reposition the catheter but was unsuccessful. The catheter was removed, right upper quadrant was revisualized with ultrasound which showed no safe window for repeat paracentesis. Hemostasis was obtained by applying pressure, dressing was applied. The patient tolerated the procedure well without immediate post procedural complication. FINDINGS: A total of approximately 15 mL of hazy amber fluid was removed. Samples were sent to the laboratory as requested by the clinical team. IMPRESSION: Successful ultrasound-guided paracentesis yielding 15 mL of peritoneal fluid. Read by: Durenda Guthrie, PA-C Electronically Signed   By: Jerilynn Mages.  Shick M.D.   On: 06/30/2022 11:48   ECHOCARDIOGRAM COMPLETE  Result Date: 06/29/2022  ECHOCARDIOGRAM REPORT   Patient Name:   Albert Harris Date of Exam: 06/29/2022 Medical Rec #:  [id number]      Height:       68.0 in Accession #:    [id number]     Weight:       151.5 lb Date of Birth:  07/02/1955     BSA:          1.816 m Patient Age:    67 years       BP:            176/80 mmHg Patient Gender: M              HR:           84 bpm. Exam Location:  Inpatient Procedure: 2D Echo, Cardiac Doppler and Color Doppler Indications:    CHF-Acute Systolic E95.28  History:        Patient has prior history of Echocardiogram examinations, most                 recent 05/09/2021. CHF, [name], PAD and COPD,                 Signs/Symptoms:Shortness of Breath; Risk Factors:Hypertension,                 Current Smoker and Dyslipidemia. ESRD.  Sonographer:    Ronny Flurry Referring Phys: [id number] [place]  1. Left ventricular ejection fraction, by estimation, is 35 to 40%. The left ventricle has moderately decreased function. The left ventricle demonstrates regional wall motion abnormalities (see scoring diagram/findings for description). There is moderate concentric left ventricular hypertrophy. Left ventricular diastolic parameters are consistent with Grade I diastolic dysfunction (impaired relaxation). Elevated left ventricular end-diastolic pressure.  2. Right ventricular systolic function is normal. The right ventricular size is normal. There is severely elevated pulmonary artery systolic pressure.  3. Left atrial size was severely dilated.  4. Right atrial size was severely dilated.  5. The mitral valve is myxomatous. Mild mitral valve regurgitation. No evidence of mitral stenosis.  6. The aortic valve is normal in structure. There is mild calcification of the aortic valve. There is mild thickening of the aortic valve. Aortic valve regurgitation is not visualized. Aortic valve sclerosis/calcification is present, without any evidence of aortic stenosis. Aortic valve area, by VTI measures 2.74 cm. Aortic valve mean gradient measures 9.0 mmHg. Aortic valve Vmax measures 2.04 m/s.  7. The inferior vena cava is normal in size with <50% respiratory variability, suggesting right atrial pressure of 8 mmHg. FINDINGS  Left Ventricle: Left ventricular ejection fraction, by  estimation, is 35 to 40%. The left ventricle has moderately decreased function. The left ventricle demonstrates regional wall motion abnormalities. The left ventricular internal cavity size was normal in size. There is moderate concentric left ventricular hypertrophy. Left ventricular diastolic parameters are consistent with Grade I diastolic dysfunction (impaired relaxation). Elevated left ventricular end-diastolic pressure.  LV Wall Scoring: The anterior wall, antero-lateral wall, posterior wall, basal anteroseptal segment, and basal inferior segment are hypokinetic. The mid and distal anterior septum, inferior septum, entire apex, and mid and distal inferior wall are normal. Right Ventricle: The right ventricular size is normal. No increase in right ventricular wall thickness. Right ventricular systolic function is normal. There is severely elevated pulmonary artery systolic pressure. The tricuspid regurgitant velocity is 4.10 m/s, and with an assumed right atrial pressure of 8 mmHg, the estimated right ventricular systolic pressure is 10.2 mmHg. Left Atrium:  Left atrial size was severely dilated. Right Atrium: Right atrial size was severely dilated. Pericardium: There is no evidence of pericardial effusion. Mitral Valve: The mitral valve is myxomatous. Mild mitral valve regurgitation. No evidence of mitral valve stenosis. Tricuspid Valve: The tricuspid valve is normal in structure. Tricuspid valve regurgitation is mild . No evidence of tricuspid stenosis. Aortic Valve: The aortic valve is normal in structure. There is mild calcification of the aortic valve. There is mild thickening of the aortic valve. Aortic valve regurgitation is not visualized. Aortic valve sclerosis/calcification is present, without any evidence of aortic stenosis. Aortic valve mean gradient measures 9.0 mmHg. Aortic valve peak gradient measures 16.6 mmHg. Aortic valve area, by VTI measures 2.74 cm. Pulmonic Valve: The pulmonic valve was  normal in structure. Pulmonic valve regurgitation is not visualized. No evidence of pulmonic stenosis. Aorta: The aortic root is normal in size and structure. Venous: The inferior vena cava is normal in size with less than 50% respiratory variability, suggesting right atrial pressure of 8 mmHg. IAS/Shunts: No atrial level shunt detected by color flow Doppler.  LEFT VENTRICLE PLAX 2D LVIDd:         5.00 cm   Diastology LVIDs:         4.30 cm   LV e' medial:    5.11 cm/s LV PW:         1.50 cm   LV E/e' medial:  24.3 LV IVS:        1.50 cm   LV e' lateral:   6.20 cm/s LVOT diam:     2.10 cm   LV E/e' lateral: 20.0 LV SV:         96 LV SV Index:   53 LVOT Area:     3.46 cm  RIGHT VENTRICLE            IVC RV S prime:     6.64 cm/s  IVC diam: 1.90 cm TAPSE (M-mode): 1.6 cm LEFT ATRIUM             Index        RIGHT ATRIUM           Index LA diam:        5.30 cm 2.92 cm/m   RA Area:     25.80 cm LA Vol (A2C):   91.1 ml 50.17 ml/m  RA Volume:   88.40 ml  48.68 ml/m LA Vol (A4C):   77.3 ml 42.57 ml/m LA Biplane Vol: 87.6 ml 48.24 ml/m  AORTIC VALVE AV Area (Vmax):    2.69 cm AV Area (Vmean):   2.50 cm AV Area (VTI):     2.74 cm AV Vmax:           204.00 cm/s AV Vmean:          139.000 cm/s AV VTI:            0.350 m AV Peak Grad:      16.6 mmHg AV Mean Grad:      9.0 mmHg LVOT Vmax:         158.33 cm/s LVOT Vmean:        100.167 cm/s LVOT VTI:          0.277 m LVOT/AV VTI ratio: 0.79  AORTA Ao Root diam: 3.20 cm Ao Asc diam:  3.50 cm MITRAL VALVE                TRICUSPID VALVE MV Area (PHT): 4.68 cm  TR Peak grad:   67.2 mmHg MV Decel Time: 162 msec     TR Vmax:        410.00 cm/s MV E velocity: 124.00 cm/s MV A velocity: 127.00 cm/s  SHUNTS MV E/A ratio:  0.98         Systemic VTI:  0.28 m                             Systemic Diam: 2.10 cm Skeet Latch MD Electronically signed by Skeet Latch MD Signature Date/Time: 06/29/2022/10:37:05 PM    Final     Labs: BMET Recent Labs  Lab 06/28/22 1340  06/29/22 0438 06/30/22 0815  NA 139 140 139  K 3.6 3.8 3.1*  CL 98 97* 99  CO2 18* 15* 26  GLUCOSE 102* 92 111*  BUN 105* 111* 49*  CREATININE 30.01* 29.32* 16.43*  CALCIUM 9.5 9.1 8.8*  PHOS  --  11.6* 6.0*   CBC Recent Labs  Lab 06/28/22 1340 06/29/22 0438  WBC 9.6 8.4  NEUTROABS 7.4  --   HGB 8.9* 8.6*  HCT 27.6* 26.9*  MCV 103.8* 100.7*  PLT 144* 123*    Medications:     amLODipine  10 mg Oral Daily   calcium acetate  1,334 mg Oral TID WC   Chlorhexidine Gluconate Cloth  6 each Topical Q0600   darbepoetin (ARANESP) injection - DIALYSIS  100 mcg Subcutaneous Q Tue-1800   feeding supplement (NEPRO CARB STEADY)  237 mL Oral BID BM   fidaxomicin  200 mg Oral BID   heparin injection (subcutaneous)  5,000 Units Subcutaneous Q8H   metoprolol succinate  25 mg Oral Daily   mupirocin ointment  1 Application Nasal BID   sacubitril-valsartan  1 tablet Oral BID      [name], MD [facility] 07/01/2022, 8:37 AM

## 2022-07-01 NOTE — Discharge Summary (Cosign Needed Addendum)
[facility] Discharge Summary  Patient name: Albert Harris Medical record number: [id number] Date of birth: 07-Sep-1955 Age: 67 y.o. Gender: male Date of Admission: 06/28/2022  Date of Discharge: 07/01/22 Admitting Physician: Blane Ohara McDiarmid, MD  Primary Care Provider: Kerin Perna, NP Consultants: Nephrology, GI  Indication for Hospitalization: Missed 3 weeks dialysis in setting of ESRD  Brief Hospital Course:  Albert Harris is a 67 y.o.male with a history of HFrEF (EF 35-40), ESRD on dialysis, COPD, HTN, PAD, R leg lymphedema, and Hx stroke  who was admitted to the [place] [facility] following missing dialysis for 2-3 weeks with findings of C. diff and cardiac ascites s/p paracentesis .  * ESRD (end stage renal disease) on dialysis ([place]) Missed dialysis for 2-3 weeks due to diarrhea and transportation needs. Cr>30 and eGFR<1.  Labs notable for elevated BUN, phosphorus, anion gap, low bicarb. Patient received 2 days of HD following admission and then discharged on third day.  HD drew total of 4 L while inpatient. On TTS schedule for HD at discharge.  Cirrhosis of liver ([place]) Noted to have right upper quadrant tenderness and hepatomegaly on admission.  RUQ [place] noted cirrhotic changes, moderated ascites, and gallbladder wall thickening (no stones).  Labs notable for PT/INR mildly increased, albumin low, Plt low, AST low, ALT wnl, T bili wnl.  Consulted GI.  Started Protonix 40 mg IV twice daily and lactulose for 1 day.  Hepatitis B and ceruloplasmin labs within normal limits.  GI obtained paracentesis, drawling 15 mL of cloudy ascitic fluid.  Analysis showed SAAG 1.2 and ascitic protein 3.3, consistent with cardiac ascites.    C. diff infection Diarrhea Presented following 5 to 7 weeks of loose stools 3-5 times per day, preventing patient from going to dialysis due to embarrassment.  C. difficile antigen and PCR testing positive.  Discontinued  lactulose and started on 10 days of fidaxomicin 200 mg twice daily with 8.5 days of treatment and left at discharge.  Diarrhea improving and decreasing in frequency when discharged.  HFrEF (heart failure with reduced ejection fraction) ([place]) Physical exam on admission with signs of volume overload.  TTE showed 35-40% (unchanged from 05/2021), G1DD, and severe biatrial dilation.  Patient maintained on home metoprolol 25 mg daily and started on Entresto 24-26 mg BID with nephrology approval.  Will follow up with cardiology outpatient given cardiac ascites as described above.   Spinal stenosis of cervical region Presented following approximately 5 to 7 week history of back/neck pain after a fall.  Gabapentin for known cervical myelopathy managed by Ortho held in setting of ESRD.  Recent MRI 06/25/22 showed spinal stenosis of C3-C4 and acute spondyloarthropathy of C5-C6 with concern for possible cervical discitis/osteomyelitis.  Patient afebrile and physical exam consistent with spinal stenosis.  Consulted patient's orthopedic physician concerning possible osteomyelitis and specialist advised that patient will require surgery for spondyloarthropathy but can follow-up outpatient.  Hypertension Consistently hypertensive this admission, but difficult to evaluate in setting of volume overload due to missing dialysis.  Continued on home amlodipine 10 mg, follow up outpatient.   Lymphedema Patient with known history of lymphedema in RLE from past cellulitis.  Continued to have chronic significant RLE>LLE edema without concern for DVT.  Other chronic conditions were medically managed with home medications and formulary alternatives as necessary (COPD, hyperphosphatemia, secondary hyperparathyroidism).  Discharge Diagnoses/Problem List:  * ESRD (end stage renal disease) on dialysis [facility]) Received second round dialysis yesterday.  No  on TTS schedule per nephrology.  -1.3L net I/O in past 24 hours (1.9 L was  removed at dialysis). - AM CMP, Phos, Mg, CBC - Nephrology consulted for dialysis. Appreciate recs:  - Renal diet  - TTS schedule for dialysis after today  - VTE PPx per primary team  - ESA for likely anemia of chronic disease  - Outpatient orders copied to d/c summary  Diarrhea C. diff antigen and PCR positive, Abx started overnight.  Positive FOBT and lactoferrin.  GI onboard, monitoring.  3 bowel movements in past day. - Fidaxomicin 200 mg PO BID (day 1 of 10)  Cirrhosis of liver (HCC) GI consulted, have provided recommendations.  Believe patient is encephalopathic given asterixis and expressive aphasia.  Considering cirrhosis d/t alcohol use, heart failure, or autoimmune sources.  Hepatitis labs pending, appears less likely. - Protonix 40 IV BID - AM CBC, CMP - GI consulted, signed off, appreciate recs  - f/u ANA, AMA  - Diagnostic paracentesis showed cardiac ascites, will see cardiology outpatient  - Rifaximin 550 mg PO BID, hold lactulose  HFrEF (heart failure with reduced ejection fraction) ([place]) Echo with unchanged EF, G1DD, and biatrial dilation.  Not on GDMT given ESRD, is on metoprolol for mortality benefit.  Starting [facility] today, confirmed acceptable with nephrology. - Continue metoprolol 25 mg daily - Start Entresto 24-26 mg BID  Hypertension BP still hypertensive, but starting Entresto today.  Outpatient follow up. - Continue amlodipine 10 mg daily - Start Entresto 24-26 mg BID  Lymphedema Hx of lymphedema in R LE from cellulitis.  Continues to have significant RLE edema, but is chronic with low concern for DVT at this time. - Consider DVT US if resp status worsens or tachycardia develops  Spinal stenosis of cervical region Has 5-7wk hx of back/neck pain after a fall, follows with ortho and has myelopathy Dx.  Remains tender to palpation in cervical paraspinal region, R>L.  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  OT signed  off, no services needed. - Given ERSD, holding gabapentin - Tylenol PRN - PT evaluation - f/u with Ortho who ordered 1/18 MRI C spine concerning findings  Disposition: Home with outpatient PT  Discharge Condition: Stable, on TTS schedule for HD  Discharge Physical Exam General: Age-appropriate, sitting on edge of bed, restless. No acute distress, alert and at baseline without aphasia. HEENT: MMM. No scleral icterus. PERRLA. Cardiovascular: Regular rate and rhythm. Normal S1/S2. No murmurs or rubs. 2+ radial pulses. Pulmonary: Sparse crackles. No increased WOB on room air. No wheezes or rales. Abdominal: Normoactive bowel sounds. Mild TTP in RUQ, negative Murphy's sign. No rebound or guarding. Cannot palpate liver. Skin: Warm and dry. No jaundice or telangiectasias. Extremities: R LE 2+ pitting edema up to knee, chronic.  No calf pain with palpation. DP and PT pulses 2+ bilaterally.  Issues for Follow Up:  On TTS schedule for dialysis, ensure compliance Recommend setting up outpatient PT f/u diarrhea frequency after C. diff Tx Needs outpatient follow up with cardiology for new Dx of cardiac cirrhosis Consider starting SGLT-2 outpatient given cardiac etiology of cirrhosis, but may need to confirm with nephrology first Per Ortho (Dr. Laurance Flatten), patient will need outpatient appointment and likely extensive neck surgery for spondyloarthropathy  Significant Procedures: - Paracentesis: 15 mL cloudy fluid drawn and sent to pathology  Significant Labs and Imaging:  Recent Labs  Lab 07/01/22 0735  WBC 8.1  HGB 8.6*  HCT 27.5*  PLT 124*   Recent  Labs  Lab 06/30/22 0815 07/01/22 0735  NA 139 138  K 3.1* 3.0*  CL 99 96*  CO2 26 29  GLUCOSE 111* 94  BUN 49* 23  CREATININE 16.43* 8.36*  CALCIUM 8.8* 8.7*  MG 2.3 1.9  PHOS 6.0* 3.6  ALKPHOS 73 70  AST 12* 14*  ALT 10 8  ALBUMIN 2.9* 2.9*   - C. 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, RA severely dilated, LA severely dilated - RUQ [place]: Cirrhotic changes, moderated ascites   Results/Tests Pending at Time of Discharge: - ANA, AMA  Discharge Medications:  Allergies as of 07/01/2022       Reactions   Lidocaine-prilocaine Rash   Caused red marks up and down arm        Medication List     STOP taking these medications    aspirin EC 81 MG tablet   gabapentin 100 MG capsule Commonly known as: NEURONTIN   HYDROcodone-acetaminophen 5-325 MG tablet Commonly known as: NORCO/VICODIN   methocarbamol 500 MG tablet Commonly known as: ROBAXIN       TAKE these medications    albuterol 108 (90 Base) MCG/ACT inhaler Commonly known as: VENTOLIN HFA INHALE 1-2 PUFFS INTO THE LUNGS EVERY 6 (SIX) HOURS AS NEEDED FOR WHEEZING OR SHORTNESS OF BREATH.   amLODipine 10 MG tablet Commonly known as: NORVASC Take 1 tablet (10 mg total) by mouth daily.   calcium acetate 667 MG capsule Commonly known as: PHOSLO Take 2 capsule by mouth three times a day with meals What changed:  how much to take when to take this   Dialyvite/Zinc Tabs Take 1 tablet by mouth daily.   Dificid 200 MG Tabs tablet Generic drug: fidaxomicin Take 1 tablet (200 mg total) by mouth 2 (two) times daily for 17 doses.   Entresto 24-26 MG Generic drug: sacubitril-valsartan Take 1 tablet by mouth 2 (two) times daily.   metoprolol succinate 25 MG 24 hr tablet Commonly known as: TOPROL-XL Take 1 tablet (25 mg total) by mouth daily.        Discharge Instructions: Please refer to Patient Instructions section of EMR for full details.  Patient was counseled important signs and symptoms that  should prompt return to medical care, changes in medications, dietary instructions, activity restrictions, and follow up appointments.   Follow-Up Appointments: - [facility] 07/13/2022 with PCP Juluis Mire, NP)  Katy Fitch, Dimitry, Medical Student 07/01/2022, 5:19 PM MS4, [place]  I was personally present and re-performed the exam and medical decision making and verified the service and findings are accurately documented in the student's note.  Precious Gilding, DO 07/01/2022 5:19 PM

## 2022-07-01 NOTE — Progress Notes (Signed)
Pt d/c to home today. Contacted [facility] to advise clinic of pt's d/c today and that pt should resume care tomorrow.   Melven Sartorius Renal Navigator [phone]

## 2022-07-02 ENCOUNTER — Telehealth (INDEPENDENT_AMBULATORY_CARE_PROVIDER_SITE_OTHER): Payer: Self-pay

## 2022-07-02 LAB — CYTOLOGY - NON PAP

## 2022-07-02 NOTE — Telephone Encounter (Signed)
Transition Care Management Unsuccessful Follow-up Telephone Call  Date of discharge and from where:  [place] 07-01-22 Dx: ESRD  Attempts:  1st Attempt  Reason for unsuccessful TCM follow-up call:  Left voice message   Juanda Crumble LPN [place] Direct Dial [phone]

## 2022-07-03 ENCOUNTER — Telehealth: Payer: Self-pay | Admitting: Nurse Practitioner

## 2022-07-03 NOTE — Telephone Encounter (Signed)
Transition of care contact from inpatient facility  Date of Discharge: 07/01/2022 Date of Contact: 07/03/2022 Method of contact: Phone  Attempted to contact patient to discuss transition of care from inpatient admission. Patient did not answer the phone. Message was left on the patient's voicemail with call back number [phone].

## 2022-07-06 NOTE — Telephone Encounter (Signed)
Transition Care Management Unsuccessful Follow-up Telephone Call  Date of discharge and from where:    [place] 07-01-22 Dx: ESRD    Attempts:  2nd Attempt  Reason for unsuccessful TCM follow-up call:  Left voice message   Juanda Crumble LPN [place] Direct Dial [phone]

## 2022-07-08 NOTE — Telephone Encounter (Signed)
Transition Care Management Unsuccessful Follow-up Telephone Call  Date of discharge and from where:  [place] 07-01-22 Dx: ESRD   Attempts:  3rd Attempt  Reason for unsuccessful TCM follow-up call:  Left voice message   Juanda Crumble LPN [place] Direct Dial [phone]

## 2022-07-13 ENCOUNTER — Ambulatory Visit (INDEPENDENT_AMBULATORY_CARE_PROVIDER_SITE_OTHER): Payer: Medicare Other | Admitting: Primary Care

## 2022-07-17 ENCOUNTER — Ambulatory Visit (INDEPENDENT_AMBULATORY_CARE_PROVIDER_SITE_OTHER): Payer: Self-pay

## 2022-07-17 ENCOUNTER — Telehealth (INDEPENDENT_AMBULATORY_CARE_PROVIDER_SITE_OTHER): Payer: Self-pay | Admitting: Primary Care

## 2022-07-17 NOTE — Telephone Encounter (Signed)
  Chief Complaint: watery diarrhea (h/o C-dff) Symptoms: intermittent RUQ pain, mucous on stool Frequency: 3 days ago  Pertinent Negatives: Patient denies fever, dizziness, new weight loss, blood on stool Disposition: [] ED /[] Urgent Care (no appt availability in office) / [] Appointment(In office/virtual)/ []  [place] [facility]/ [] Home Care/ [] Refused Recommended Disposition /[] [place] [facility]/ [x]  Follow-up with PCP Additional Notes: no available appts- pt asking of he needs another round of abx. Discussed VV and pt stated unable to do . Pt stated the diarrhea started a couple of days after the abx stopped.  Reason for Disposition  [1] Mucus or pus in stool AND [2] present > 2 days AND [3] diarrhea is more than mild  Answer Assessment - Initial Assessment Questions 1. DIARRHEA SEVERITY: "How bad is the diarrhea?" "How many more stools have you had in the past 24 hours than normal?"    - NO DIARRHEA (SCALE 0)   - MILD (SCALE 1-3): Few loose or mushy BMs; increase of 1-3 stools over normal daily number of stools; mild increase in ostomy output.   -  MODERATE (SCALE 4-7): Increase of 4-6 stools daily over normal; moderate increase in ostomy output.   -  SEVERE (SCALE 8-10; OR "WORST POSSIBLE"): Increase of 7 or more stools daily over normal; moderate increase in ostomy output; incontinence.     Occ watery -mild 2. ONSET: "When did the diarrhea begin?"      3 days when out of abx 3. BM CONSISTENCY: "How loose or watery is the diarrhea?"      Loose to watery  4. VOMITING: "Are you also vomiting?" If Yes, ask: "How many times in the past 24 hours?"      no 5. ABDOMEN PAIN: "Are you having any abdomen pain?" If Yes, ask: "What does it feel like?" (e.g., crampy, dull, intermittent, constant)      Right upper abd pain 6. ABDOMEN PAIN SEVERITY: If present, ask: "How bad is the pain?"  (e.g., Scale 1-10; mild, moderate, or severe)   - MILD (1-3): doesn't interfere with normal activities,  abdomen soft and not tender to touch    - MODERATE (4-7): interferes with normal activities or awakens from sleep, abdomen tender to touch    - SEVERE (8-10): excruciating pain, doubled over, unable to do any normal activities       Moderate- comes and goes with movement OOB or morning- sharp pains lasts a few seconds to min 7. ORAL INTAKE: If vomiting, "Have you been able to drink liquids?" "How much liquids have you had in the past 24 hours?"     N/a 8. HYDRATION: "Any signs of dehydration?" (e.g., dry mouth [not just dry lips], too weak to stand, dizziness, new weight loss) "When did you last urinate?"     Dry lips  9. EXPOSURE: "Have you traveled to a foreign country recently?" "Have you been exposed to anyone with diarrhea?" "Could you have eaten any food that was spoiled?"     N/a 10. ANTIBIOTIC USE: "Are you taking antibiotics now or have you taken antibiotics in the past 2 months?"       Yes  11. OTHER SYMPTOMS: "Do you have any other symptoms?" (e.g., fever, blood in stool)       no 12. PREGNANCY: "Is there any chance you are pregnant?" "When was your last menstrual period?"       N/a  Protocols used: [facility]

## 2022-07-17 NOTE — Telephone Encounter (Signed)
Patient's wife called in stating they had spoken with nurse Clarise Cruz. Patient says nurse was supposed to return her call, but she wasn't sure she had the right number. Patient's wife gave [phone] as the correct number. Please follow up with patient.

## 2022-07-17 NOTE — Telephone Encounter (Signed)
Scheduled a virtual appointment with PCP.

## 2022-07-17 NOTE — Telephone Encounter (Signed)
Copied this in the Nurse Triage encounter and routed to the office.

## 2022-07-17 NOTE — Telephone Encounter (Signed)
See the below way to contact patient:      07/17/22  3:07 PM Note Patient's wife called in stating they had spoken with nurse Clarise Cruz. Patient says nurse was supposed to return her call, but she wasn't sure she had the right number. Patient's wife gave [phone] as the correct number. Please follow up with patient.

## 2022-07-20 ENCOUNTER — Encounter (INDEPENDENT_AMBULATORY_CARE_PROVIDER_SITE_OTHER): Payer: Self-pay

## 2022-07-20 ENCOUNTER — Ambulatory Visit (INDEPENDENT_AMBULATORY_CARE_PROVIDER_SITE_OTHER): Payer: Medicare Other | Admitting: Primary Care

## 2022-07-20 DIAGNOSIS — R197 Diarrhea, unspecified: Secondary | ICD-10-CM | POA: Diagnosis not present

## 2022-07-21 ENCOUNTER — Inpatient Hospital Stay (HOSPITAL_COMMUNITY)
Admission: EM | Admit: 2022-07-21 | Discharge: 2022-07-24 | DRG: 291 | Disposition: A | Discharge status: Home / Self Care | Payer: Medicare Other | Attending: Family Medicine | Admitting: Family Medicine

## 2022-07-21 ENCOUNTER — Emergency Department (HOSPITAL_COMMUNITY): Payer: Medicare Other

## 2022-07-21 ENCOUNTER — Other Ambulatory Visit: Payer: Self-pay

## 2022-07-21 ENCOUNTER — Encounter (HOSPITAL_COMMUNITY): Payer: Self-pay | Admitting: Emergency Medicine

## 2022-07-21 DIAGNOSIS — I132 Hypertensive heart and chronic kidney disease with heart failure and with stage 5 chronic kidney disease, or end stage renal disease: Principal | ICD-10-CM | POA: Diagnosis present

## 2022-07-21 DIAGNOSIS — I5020 Unspecified systolic (congestive) heart failure: Secondary | ICD-10-CM | POA: Diagnosis present

## 2022-07-21 DIAGNOSIS — N186 End stage renal disease: Secondary | ICD-10-CM

## 2022-07-21 DIAGNOSIS — Z8774 Personal history of (corrected) congenital malformations of heart and circulatory system: Secondary | ICD-10-CM

## 2022-07-21 DIAGNOSIS — R0600 Dyspnea, unspecified: Secondary | ICD-10-CM

## 2022-07-21 DIAGNOSIS — A0471 Enterocolitis due to Clostridium difficile, recurrent: Secondary | ICD-10-CM | POA: Diagnosis present

## 2022-07-21 DIAGNOSIS — L299 Pruritus, unspecified: Secondary | ICD-10-CM | POA: Diagnosis present

## 2022-07-21 DIAGNOSIS — Z8249 Family history of ischemic heart disease and other diseases of the circulatory system: Secondary | ICD-10-CM

## 2022-07-21 DIAGNOSIS — I739 Peripheral vascular disease, unspecified: Secondary | ICD-10-CM | POA: Diagnosis present

## 2022-07-21 DIAGNOSIS — Z8673 Personal history of transient ischemic attack (TIA), and cerebral infarction without residual deficits: Secondary | ICD-10-CM

## 2022-07-21 DIAGNOSIS — Z79899 Other long term (current) drug therapy: Secondary | ICD-10-CM

## 2022-07-21 DIAGNOSIS — D631 Anemia in chronic kidney disease: Secondary | ICD-10-CM | POA: Diagnosis present

## 2022-07-21 DIAGNOSIS — I5042 Chronic combined systolic (congestive) and diastolic (congestive) heart failure: Secondary | ICD-10-CM | POA: Diagnosis present

## 2022-07-21 DIAGNOSIS — Z87891 Personal history of nicotine dependence: Secondary | ICD-10-CM

## 2022-07-21 DIAGNOSIS — K219 Gastro-esophageal reflux disease without esophagitis: Secondary | ICD-10-CM | POA: Diagnosis present

## 2022-07-21 DIAGNOSIS — A0472 Enterocolitis due to Clostridium difficile, not specified as recurrent: Secondary | ICD-10-CM

## 2022-07-21 DIAGNOSIS — Z8616 Personal history of COVID-19: Secondary | ICD-10-CM

## 2022-07-21 DIAGNOSIS — F1010 Alcohol abuse, uncomplicated: Secondary | ICD-10-CM | POA: Diagnosis present

## 2022-07-21 DIAGNOSIS — R197 Diarrhea, unspecified: Secondary | ICD-10-CM | POA: Diagnosis present

## 2022-07-21 DIAGNOSIS — E8770 Fluid overload, unspecified: Secondary | ICD-10-CM

## 2022-07-21 DIAGNOSIS — E875 Hyperkalemia: Secondary | ICD-10-CM | POA: Diagnosis present

## 2022-07-21 DIAGNOSIS — I10 Essential (primary) hypertension: Secondary | ICD-10-CM | POA: Diagnosis present

## 2022-07-21 DIAGNOSIS — E785 Hyperlipidemia, unspecified: Secondary | ICD-10-CM | POA: Diagnosis present

## 2022-07-21 DIAGNOSIS — K7460 Unspecified cirrhosis of liver: Secondary | ICD-10-CM | POA: Diagnosis present

## 2022-07-21 DIAGNOSIS — J4489 Other specified chronic obstructive pulmonary disease: Secondary | ICD-10-CM | POA: Diagnosis present

## 2022-07-21 DIAGNOSIS — Z91158 Patient's noncompliance with renal dialysis for other reason: Secondary | ICD-10-CM

## 2022-07-21 DIAGNOSIS — N2581 Secondary hyperparathyroidism of renal origin: Secondary | ICD-10-CM | POA: Diagnosis present

## 2022-07-21 DIAGNOSIS — Z992 Dependence on renal dialysis: Secondary | ICD-10-CM

## 2022-07-21 NOTE — Progress Notes (Unsigned)
[facility]  Virtual Visit via Telephone Note  I connected with Albert Harris, on 07/21/2022 at 10:16 PM through an audio and video application and verified that I am speaking with the correct person using two identifiers.   Consent: I discussed the limitations, risks, security and privacy concerns of performing an evaluation and management service by telephone and the availability of in person appointments. I also discussed with the patient that there may be a patient responsible charge related to this service. The patient expressed understanding and agreed to proceed.   Location of Patient: Home  Location of Provider: [facility] [place]   Persons participating in Telemedicine visit: FREDICK WEAKS Juluis Mire,  NP   History of Present Illness: Mr.Albert Harris is a 67 year old male who is having a virtual visit due to inability to keep foods down and 4-5 loose stools since he completed the medication prescribed to him at discharge.  Stressed to patient his age and recent sickness he is at risk for dehydration.  Recommended patient to go to hospital for evaluation and treatment if needed.   Past Medical History:  Diagnosis Date   Acute metabolic encephalopathy [id number]   Acute on chronic diastolic CHF (congestive heart failure) ([place]) 12/15/2020   Acute respiratory disease due to COVID-19 virus 06/16/2020   Alcohol use disorder 06/30/2022   Anemia    Asthma    Bacteremia due to Gram-positive bacteria 05/09/2021   Carotid stenosis 09/12/2019   CHF (congestive heart failure) (HCC)    Claudication in peripheral vascular disease ([place]) 123456   Complication of anesthesia    " i HAVE A HARD TIME WAKING UP " (only one time)   COPD (chronic obstructive pulmonary disease) ([place])    COVID-19    positive on  06/16/20   Critical limb ischemia of right lower extremity (HCC)    ESRD on hemodialysis ([place])    t, th.  sat dialysis   GERD (gastroesophageal reflux disease)    HLD (hyperlipidemia)    Hypertension    Hypertensive urgency 12/24/2016   Lower GI bleed    Peripheral vascular disease (HCC)    PFO with atrial septal aneurysm    by echo 11/2017   Pneumonia    Stroke [facility])    Surgical wound infection 09/20/2021   [name] (transient ischemic attack) 11/2017   Tobacco abuse    quit 07/17/19   Volume overload 12/14/2020   Allergies  Allergen Reactions   Lidocaine-Prilocaine Rash    Caused red marks up and down arm    Current Outpatient Medications on File Prior to Visit  Medication Sig Dispense Refill   albuterol (VENTOLIN HFA) 108 (90 Base) MCG/ACT inhaler INHALE 1-2 PUFFS INTO THE LUNGS EVERY 6 (SIX) HOURS AS NEEDED FOR WHEEZING OR SHORTNESS OF BREATH. (Patient not taking: Reported on 06/28/2022) 8 g 0   amLODipine (NORVASC) 10 MG tablet Take 1 tablet (10 mg total) by mouth daily. 90 tablet 1   B Complex-C-Zn-Folic Acid (DIALYVITE/ZINC) TABS Take 1 tablet by mouth daily.     calcium acetate (PHOSLO) 667 MG capsule Take 2 capsule by mouth three times a day with meals (Patient taking differently: Take 1,334 mg by mouth 3 (three) times daily with meals.) 540 capsule 3   metoprolol succinate (TOPROL-XL) 25 MG 24 hr tablet Take 1 tablet (25 mg total) by mouth daily. 30 tablet 0   sacubitril-valsartan (ENTRESTO) 24-26 MG Take 1 tablet by mouth 2 (two)  times daily. 60 tablet 0   No current facility-administered medications on file prior to visit.    Observations/Objective: Diarrhea loose stools 3-4 times daily, inability to keep liquids down without abdominal pain and bloating  Assessment and Plan: Diagnoses and all orders for this visit:  Diarrhea, unspecified type Instructed to go to the ED for evaluation and treatment  Follow Up Instructions:    I discussed the assessment and treatment plan with the patient. The patient was provided an opportunity to ask questions and all were answered. The  patient agreed with the plan and demonstrated an understanding of the instructions.   The patient was advised to call back or seek an in-person evaluation if the symptoms worsen or if the condition fails to improve as anticipated.     I provided 20 minutes total of non-face-to-face time during this encounter including median intraservice time, reviewing previous notes, investigations, ordering medications, medical decision making, coordinating care and patient verbalized understanding at the end of the visit.    This note has been created with Surveyor, quantity. Any transcriptional errors are unintentional.   Kerin Perna, NP 07/21/2022, 10:16 PM

## 2022-07-21 NOTE — ED Notes (Addendum)
Pt in xray

## 2022-07-21 NOTE — ED Triage Notes (Signed)
Pt c/o SHOB and diarrhea x 1 month. Last dialysis was Thursday, supposed to go T TH SAT

## 2022-07-21 NOTE — ED Notes (Signed)
Pt back in room.

## 2022-07-22 DIAGNOSIS — Z79899 Other long term (current) drug therapy: Secondary | ICD-10-CM | POA: Diagnosis not present

## 2022-07-22 DIAGNOSIS — Z87891 Personal history of nicotine dependence: Secondary | ICD-10-CM | POA: Diagnosis not present

## 2022-07-22 DIAGNOSIS — L299 Pruritus, unspecified: Secondary | ICD-10-CM | POA: Diagnosis present

## 2022-07-22 DIAGNOSIS — J4489 Other specified chronic obstructive pulmonary disease: Secondary | ICD-10-CM | POA: Diagnosis present

## 2022-07-22 DIAGNOSIS — I739 Peripheral vascular disease, unspecified: Secondary | ICD-10-CM | POA: Diagnosis present

## 2022-07-22 DIAGNOSIS — I5042 Chronic combined systolic (congestive) and diastolic (congestive) heart failure: Secondary | ICD-10-CM | POA: Diagnosis present

## 2022-07-22 DIAGNOSIS — A0471 Enterocolitis due to Clostridium difficile, recurrent: Secondary | ICD-10-CM | POA: Diagnosis present

## 2022-07-22 DIAGNOSIS — E785 Hyperlipidemia, unspecified: Secondary | ICD-10-CM | POA: Diagnosis present

## 2022-07-22 DIAGNOSIS — K219 Gastro-esophageal reflux disease without esophagitis: Secondary | ICD-10-CM | POA: Diagnosis present

## 2022-07-22 DIAGNOSIS — I5020 Unspecified systolic (congestive) heart failure: Secondary | ICD-10-CM

## 2022-07-22 DIAGNOSIS — Z8673 Personal history of transient ischemic attack (TIA), and cerebral infarction without residual deficits: Secondary | ICD-10-CM | POA: Diagnosis not present

## 2022-07-22 DIAGNOSIS — I132 Hypertensive heart and chronic kidney disease with heart failure and with stage 5 chronic kidney disease, or end stage renal disease: Secondary | ICD-10-CM | POA: Diagnosis present

## 2022-07-22 DIAGNOSIS — A0472 Enterocolitis due to Clostridium difficile, not specified as recurrent: Secondary | ICD-10-CM

## 2022-07-22 DIAGNOSIS — D631 Anemia in chronic kidney disease: Secondary | ICD-10-CM | POA: Diagnosis present

## 2022-07-22 DIAGNOSIS — Z992 Dependence on renal dialysis: Secondary | ICD-10-CM | POA: Diagnosis not present

## 2022-07-22 DIAGNOSIS — Z8774 Personal history of (corrected) congenital malformations of heart and circulatory system: Secondary | ICD-10-CM | POA: Diagnosis not present

## 2022-07-22 DIAGNOSIS — Z8616 Personal history of COVID-19: Secondary | ICD-10-CM | POA: Diagnosis not present

## 2022-07-22 DIAGNOSIS — N2581 Secondary hyperparathyroidism of renal origin: Secondary | ICD-10-CM | POA: Diagnosis present

## 2022-07-22 DIAGNOSIS — F1010 Alcohol abuse, uncomplicated: Secondary | ICD-10-CM | POA: Diagnosis present

## 2022-07-22 DIAGNOSIS — N186 End stage renal disease: Secondary | ICD-10-CM | POA: Diagnosis present

## 2022-07-22 DIAGNOSIS — Z91158 Patient's noncompliance with renal dialysis for other reason: Secondary | ICD-10-CM | POA: Diagnosis not present

## 2022-07-22 DIAGNOSIS — E875 Hyperkalemia: Secondary | ICD-10-CM | POA: Diagnosis present

## 2022-07-22 DIAGNOSIS — K7460 Unspecified cirrhosis of liver: Secondary | ICD-10-CM | POA: Diagnosis present

## 2022-07-22 DIAGNOSIS — Z8249 Family history of ischemic heart disease and other diseases of the circulatory system: Secondary | ICD-10-CM | POA: Diagnosis not present

## 2022-07-22 LAB — GASTROINTESTINAL PANEL BY PCR, STOOL (REPLACES STOOL CULTURE)

## 2022-07-22 LAB — COMPREHENSIVE METABOLIC PANEL
ALT: 19 U/L (ref 0–44)
AST: 22 U/L (ref 15–41)
Albumin: 3.5 g/dL (ref 3.5–5.0)
Alkaline Phosphatase: 110 U/L (ref 38–126)
Anion gap: 21 — ABNORMAL HIGH (ref 5–15)
BUN: 94 mg/dL — ABNORMAL HIGH (ref 8–23)
CO2: 21 mmol/L — ABNORMAL LOW (ref 22–32)
Calcium: 8.9 mg/dL (ref 8.9–10.3)
Chloride: 94 mmol/L — ABNORMAL LOW (ref 98–111)
Creatinine, Ser: 15.47 mg/dL — ABNORMAL HIGH (ref 0.61–1.24)
GFR, Estimated: 3 mL/min — ABNORMAL LOW (ref >60)
Glucose, Bld: 93 mg/dL (ref 70–99)
Potassium: 5.7 mmol/L — ABNORMAL HIGH (ref 3.5–5.1)
Sodium: 136 mmol/L (ref 135–145)
Total Bilirubin: 1.5 mg/dL — ABNORMAL HIGH (ref 0.3–1.2)
Total Protein: 7.5 g/dL (ref 6.5–8.1)

## 2022-07-22 LAB — CBC WITH DIFFERENTIAL/PLATELET
Abs Immature Granulocytes: 0.05 10*3/uL (ref 0.00–0.07)
Basophils Absolute: 0.1 10*3/uL (ref 0.0–0.1)
Basophils Relative: 1 %
Eosinophils Absolute: 0 10*3/uL (ref 0.0–0.5)
Eosinophils Relative: 0 %
HCT: 28.8 % — ABNORMAL LOW (ref 39.0–52.0)
Hemoglobin: 9 g/dL — ABNORMAL LOW (ref 13.0–17.0)
Immature Granulocytes: 1 %
Lymphocytes Relative: 10 %
Lymphs Abs: 1.1 10*3/uL (ref 0.7–4.0)
MCH: 32.8 pg (ref 26.0–34.0)
MCHC: 31.3 g/dL (ref 30.0–36.0)
MCV: 105.1 fL — ABNORMAL HIGH (ref 80.0–100.0)
Monocytes Absolute: 0.9 10*3/uL (ref 0.1–1.0)
Monocytes Relative: 8 %
Neutro Abs: 8.9 10*3/uL — ABNORMAL HIGH (ref 1.7–7.7)
Neutrophils Relative %: 80 %
Platelets: 183 10*3/uL (ref 150–400)
RBC: 2.74 MIL/uL — ABNORMAL LOW (ref 4.22–5.81)
RDW: 17.6 % — ABNORMAL HIGH (ref 11.5–15.5)
WBC: 11 10*3/uL — ABNORMAL HIGH (ref 4.0–10.5)
nRBC: 0 % (ref 0.0–0.2)

## 2022-07-22 LAB — C DIFFICILE QUICK SCREEN W PCR REFLEX
C Diff antigen: POSITIVE — AB
C Diff toxin: NEGATIVE

## 2022-07-22 LAB — LIPASE, BLOOD: Lipase: 27 U/L (ref 11–51)

## 2022-07-22 LAB — CLOSTRIDIUM DIFFICILE BY PCR, REFLEXED: Toxigenic C. Difficile by PCR: POSITIVE — AB

## 2022-07-22 LAB — RESP PANEL BY RT-PCR (RSV, FLU A&B, COVID)  RVPGX2
Influenza A by PCR: NEGATIVE
Influenza B by PCR: NEGATIVE
Resp Syncytial Virus by PCR: NEGATIVE
SARS Coronavirus 2 by RT PCR: NEGATIVE

## 2022-07-22 LAB — MAGNESIUM: Magnesium: 3.3 mg/dL — ABNORMAL HIGH (ref 1.7–2.4)

## 2022-07-22 LAB — BRAIN NATRIURETIC PEPTIDE: B Natriuretic Peptide: >4500 pg/mL — ABNORMAL HIGH (ref 0.0–100.0)

## 2022-07-22 LAB — GLUCOSE, CAPILLARY: Glucose-Capillary: 101 mg/dL — ABNORMAL HIGH (ref 70–99)

## 2022-07-22 MED ORDER — SODIUM ZIRCONIUM CYCLOSILICATE 10 G PO PACK
10.0000 g | PACK | Freq: Once | ORAL | Status: AC
  Administered 2022-07-22: 10 g via ORAL
  Filled 2022-07-22: qty 1

## 2022-07-22 MED ORDER — ANTICOAGULANT SODIUM CITRATE 4% (200MG/5ML) IV SOLN: 5.0000 mL | Status: DC | PRN

## 2022-07-22 MED ORDER — AMLODIPINE BESYLATE 10 MG PO TABS
10.0000 mg | ORAL_TABLET | Freq: Every day | ORAL | Status: DC
  Administered 2022-07-22 – 2022-07-24 (×3): 10 mg via ORAL
  Filled 2022-07-22 (×4): qty 1

## 2022-07-22 MED ORDER — SACUBITRIL-VALSARTAN 24-26 MG PO TABS
1.0000 | ORAL_TABLET | Freq: Two times a day (BID) | ORAL | Status: DC
  Filled 2022-07-22: qty 1

## 2022-07-22 MED ORDER — SACUBITRIL-VALSARTAN 24-26 MG PO TABS
1.0000 | ORAL_TABLET | Freq: Two times a day (BID) | ORAL | Status: DC
  Administered 2022-07-22 – 2022-07-24 (×4): 1 via ORAL
  Filled 2022-07-22 (×4): qty 1

## 2022-07-22 MED ORDER — ALTEPLASE 2 MG IJ SOLR: 2.0000 mg | Freq: Once | INTRAMUSCULAR | Status: DC | PRN

## 2022-07-22 MED ORDER — ACETAMINOPHEN 325 MG PO TABS: 650.0000 mg | ORAL_TABLET | ORAL | Status: DC | PRN

## 2022-07-22 MED ORDER — HEPARIN SODIUM (PORCINE) 1000 UNIT/ML DIALYSIS: 1000.0000 [IU] | INTRAMUSCULAR | Status: DC | PRN

## 2022-07-22 MED ORDER — RENA-VITE PO TABS
1.0000 | ORAL_TABLET | Freq: Every day | ORAL | Status: DC
  Administered 2022-07-22 – 2022-07-23 (×2): 1 via ORAL
  Filled 2022-07-22 (×3): qty 1

## 2022-07-22 MED ORDER — SODIUM CHLORIDE 0.9% FLUSH: 3.0000 mL | INTRAVENOUS | Status: DC | PRN

## 2022-07-22 MED ORDER — METOPROLOL SUCCINATE ER 25 MG PO TB24
25.0000 mg | ORAL_TABLET | Freq: Every day | ORAL | Status: DC
  Administered 2022-07-22 – 2022-07-24 (×3): 25 mg via ORAL
  Filled 2022-07-22 (×3): qty 1

## 2022-07-22 MED ORDER — PENTAFLUOROPROP-TETRAFLUOROETH EX AERO: 1.0000 | INHALATION_SPRAY | CUTANEOUS | Status: DC | PRN

## 2022-07-22 MED ORDER — FIDAXOMICIN 200 MG PO TABS: 200.0000 mg | ORAL_TABLET | ORAL | Status: DC

## 2022-07-22 MED ORDER — DIPHENHYDRAMINE HCL 50 MG/ML IJ SOLN
50.0000 mg | Freq: Once | INTRAMUSCULAR | Status: AC
  Administered 2022-07-22: 50 mg via INTRAVENOUS
  Filled 2022-07-22: qty 1

## 2022-07-22 MED ORDER — CHLORHEXIDINE GLUCONATE CLOTH 2 % EX PADS
6.0000 | MEDICATED_PAD | Freq: Every day | CUTANEOUS | Status: DC
  Administered 2022-07-22 – 2022-07-24 (×3): 6 via TOPICAL

## 2022-07-22 MED ORDER — SODIUM CHLORIDE 0.9 % IV SOLN: 250.0000 mL | INTRAVENOUS | Status: DC | PRN

## 2022-07-22 MED ORDER — PENTAFLUOROPROP-TETRAFLUOROETH EX AERO
1.0000 | INHALATION_SPRAY | CUTANEOUS | Status: DC | PRN
  Filled 2022-07-22: qty 30

## 2022-07-22 MED ORDER — FIDAXOMICIN 200 MG PO TABS
200.0000 mg | ORAL_TABLET | Freq: Two times a day (BID) | ORAL | Status: DC
  Administered 2022-07-22 – 2022-07-24 (×4): 200 mg via ORAL
  Filled 2022-07-22 (×6): qty 1

## 2022-07-22 MED ORDER — CALCIUM ACETATE (PHOS BINDER) 667 MG PO CAPS
1334.0000 mg | ORAL_CAPSULE | Freq: Three times a day (TID) | ORAL | Status: DC
  Administered 2022-07-22 – 2022-07-24 (×5): 1334 mg via ORAL
  Filled 2022-07-22 (×5): qty 2

## 2022-07-22 MED ORDER — SODIUM CHLORIDE 0.9% FLUSH
3.0000 mL | Freq: Two times a day (BID) | INTRAVENOUS | Status: DC
  Administered 2022-07-22 – 2022-07-24 (×4): 3 mL via INTRAVENOUS

## 2022-07-22 MED ORDER — FIDAXOMICIN 200 MG PO TABS
200.0000 mg | ORAL_TABLET | Freq: Two times a day (BID) | ORAL | Status: DC
  Filled 2022-07-22: qty 1

## 2022-07-22 MED ORDER — HEPARIN SODIUM (PORCINE) 5000 UNIT/ML IJ SOLN
5000.0000 [IU] | Freq: Three times a day (TID) | INTRAMUSCULAR | Status: DC
  Administered 2022-07-22 – 2022-07-24 (×5): 5000 [IU] via SUBCUTANEOUS
  Filled 2022-07-22 (×5): qty 1

## 2022-07-22 NOTE — Assessment & Plan Note (Addendum)
K 5.7 on admission due to missed dialysis. Received 10 mg lokelma in the ED.  - Cardiac telemetry  - Renal diet  - per nephro as above

## 2022-07-22 NOTE — Progress Notes (Signed)
Spoke with Dr. Posey Pronto with Nephrology about need for HD. K 5.7- we had given him a dose of lokelma. Their team will come and see him.

## 2022-07-22 NOTE — ED Notes (Signed)
ED TO INPATIENT HANDOFF REPORT  ED Nurse Name and Phone #:   S Name/Age/Gender [place] 67 y.o. male Room/Bed: 030C/030C  Code Status   Code Status: Full Code  Home/SNF/Other Home Patient oriented to: self, place, time, and situation Is this baseline? Yes   Triage Complete: Triage complete  Chief Complaint HFrEF (heart failure with reduced ejection fraction) ([place]) [I50.20]  Triage Note Pt c/o SHOB and diarrhea x 1 month. Last dialysis was Thursday, supposed to go T TH SAT   Allergies Allergies  Allergen Reactions   Lidocaine-Prilocaine Rash    Caused red marks up and down arm    Level of Care/Admitting Diagnosis ED Disposition     ED Disposition  Admit   Condition  --   Comment  Hospital Area: [facility] [[id number]]  Level of Care: Telemetry Medical [104]  May admit patient to [facility] or [facility] if equivalent level of care is available:: No  Covid Evaluation: Confirmed COVID Negative  Diagnosis: HFrEF (heart failure with reduced ejection fraction) [facility]) [id number]  Admitting Physician: Lowry Ram [id number]  Attending Physician: Leeanne Rio [id number]  Certification:: I certify this patient will need inpatient services for at least 2 midnights  Estimated Length of Stay: 3          B Medical/Surgery History Past Medical History:  Diagnosis Date   Acute metabolic encephalopathy [id number]   Acute on chronic diastolic CHF (congestive heart failure) ([place]) 12/15/2020   Acute respiratory disease due to COVID-19 virus 06/16/2020   Alcohol use disorder 06/30/2022   Anemia    Asthma    Bacteremia due to Gram-positive bacteria 05/09/2021   Carotid stenosis 09/12/2019   CHF (congestive heart failure) ([place])    Claudication in peripheral vascular disease ([place]) [id number]   Complication of anesthesia    " i HAVE A HARD TIME WAKING UP " (only one time)   COPD (chronic obstructive pulmonary disease) ([place])    COVID-19     positive on  06/16/20   Critical limb ischemia of right lower extremity ([place])    ESRD on hemodialysis ([place])    t, th. sat dialysis   GERD (gastroesophageal reflux disease)    HLD (hyperlipidemia)    Hypertension    Hypertensive urgency 12/24/2016   Lower GI bleed    Peripheral vascular disease ([place])    PFO with atrial septal aneurysm    by echo 11/2017   Pneumonia    Stroke [facility])    Surgical wound infection 09/20/2021   [name] (transient ischemic attack) 11/2017   Tobacco abuse    quit 07/17/19   Volume overload 12/14/2020   Past Surgical History:  Procedure Laterality Date   ABDOMINAL AORTOGRAM W/LOWER EXTREMITY N/A 06/21/2020   Procedure: ABDOMINAL AORTOGRAM W/LOWER EXTREMITY;  Surgeon: Angelia Mould, MD;  Location: [place] [facility];  Service: Cardiovascular;  Laterality: N/A;   ABDOMINAL AORTOGRAM W/LOWER EXTREMITY Right 08/29/2021   Procedure: ABDOMINAL AORTOGRAM W/LOWER EXTREMITY;  Surgeon: Cherre Robins, MD;  Location: [place] [facility];  Service: Cardiovascular;  Laterality: Right;   APPLICATION OF WOUND VAC Right 09/21/2021   Procedure: APPLICATION OF WOUND VAC;  Surgeon: Angelia Mould, MD;  Location: [place];  Service: Vascular;  Laterality: Right;   AV FISTULA PLACEMENT Left 03/28/2020   Procedure: ARTERIOVENOUS (AV) FISTULA CREATION LEFT;  Surgeon: [name], MD;  Location: [place];  Service: Vascular;  Laterality: Left;   AV FISTULA PLACEMENT Right 09/23/2020  Procedure: RIGHT Arm ARTERIOVENOUS GRAFT CREATION.;  Surgeon: Angelia Mould, MD;  Location: [place];  Service: Vascular;  Laterality: Right;   COLONOSCOPY     COLONOSCOPY WITH PROPOFOL N/A 06/27/2020   Procedure: COLONOSCOPY WITH PROPOFOL;  Surgeon: Carol Ada, MD;  Location: [place];  Service: Endoscopy;  Laterality: N/A;   DRAINAGE AND CLOSURE OF LYMPHOCELE Right 09/21/2021   Procedure: EVACUATION OF LYMPHOCELE RIGHT GROIN;  Surgeon: Angelia Mould, MD;  Location: [place];   Service: Vascular;  Laterality: Right;   ENDARTERECTOMY FEMORAL Right 09/05/2021   Procedure: ENDARTERECTOMY FEMORAL;  Surgeon: Angelia Mould, MD;  Location: [place];  Service: Vascular;  Laterality: Right;   FEMORAL-POPLITEAL BYPASS GRAFT Right 09/05/2021   Procedure: RIGHT FEMORAL-POPLITEAL ARTERY BYPASS WITH Spaphenous Vein;  Surgeon: Angelia Mould, MD;  Location: [place];  Service: Vascular;  Laterality: Right;   [place]  06/27/2020   Procedure: HEMOSTASIS CLIP PLACEMENT;  Surgeon: Carol Ada, MD;  Location: [place];  Service: Endoscopy;;   INSERTION OF DIALYSIS CATHETER Right 06/24/2020   Procedure: INSERTION OF TUNNELED  DIALYSIS CATHETER; Removal of temporary dialysis catheter right neck;  Surgeon: Angelia Mould, MD;  Location: [place];  Service: Vascular;  Laterality: Right;   IR PARACENTESIS  06/30/2022   LIGATION OF ARTERIOVENOUS  FISTULA Left 07/24/2020   Procedure: LIGATION OF LEFT BRACHIOCEPHALIC ARTERIOVENOUS  FISTULA;  Surgeon: [name], MD;  Location: [place];  Service: Vascular;  Laterality: Left;   PERIPHERAL VASCULAR INTERVENTION Right 06/21/2020   Procedure: PERIPHERAL VASCULAR INTERVENTION;  Surgeon: Angelia Mould, MD;  Location: [place] [facility];  Service: Cardiovascular;  Laterality: Right;   TEE WITHOUT CARDIOVERSION N/A 05/13/2021   Procedure: TRANSESOPHAGEAL ECHOCARDIOGRAM (TEE);  Surgeon: Donato Heinz, MD;  Location: [place];  Service: Cardiovascular;  Laterality: N/A;   VEIN HARVEST Right 09/05/2021   Procedure: RIGHT Saphenous VEIN HARVEST;  Surgeon: Angelia Mould, MD;  Location: [facility];  Service: Vascular;  Laterality: Right;     A IV Location/Drains/Wounds Patient Lines/Drains/Airways Status     Active Line/Drains/Airways     Name Placement date Placement time Site Days   Peripheral IV 07/22/22 20 G 1.88" Anterior;Left Forearm 07/22/22  0017  Forearm  less  than 1   Fistula / Graft Right Upper arm Arteriovenous vein graft --  --  Upper arm  --   Negative Pressure Wound Therapy Thigh Anterior;Proximal;Right 09/21/21  0755  --  304   Wound / Incision (Open or Dehisced) 08/28/21 Other (Comment) Toe (Comment  which one) Anterior;Right blister to toe 08/28/21  1800  Toe (Comment  which one)  328            Intake/Output Last 24 hours  Intake/Output Summary (Last 24 hours) at 07/22/2022 1219 Last data filed at 07/22/2022 1009 Gross per 24 hour  Intake 240 ml  Output --  Net 240 ml    Labs/Imaging Results for orders placed or performed during the hospital encounter of 07/21/22 (from the past 48 hour(s))  C Difficile Quick Screen w PCR reflex     Status: Abnormal   Collection Time: 07/21/22 11:36 PM   Specimen: STOOL  Result Value Ref Range   C Diff antigen POSITIVE (A) NEGATIVE   C Diff toxin NEGATIVE NEGATIVE   C Diff interpretation Results are indeterminate. See PCR results.     Comment: Performed at [facility], [place],  [place] [address]  C. Diff by PCR, Reflexed     Status: Abnormal   Collection Time: 07/21/22 11:36 PM  Result Value Ref Range   Toxigenic C. Difficile by PCR POSITIVE (A) NEGATIVE    Comment: Positive for toxigenic C. difficile with little to no toxin production. Only treat if clinical presentation suggests symptomatic illness. Performed at [name], [place] [address] [place], [place] [address]   Resp panel by RT-PCR (RSV, Flu A&B, Covid) Anterior Nasal Swab     Status: None   Collection Time: 07/21/22 11:37 PM   Specimen: Anterior Nasal Swab  Result Value Ref Range   SARS Coronavirus 2 by RT PCR NEGATIVE NEGATIVE   Influenza A by PCR NEGATIVE NEGATIVE   Influenza B by PCR NEGATIVE NEGATIVE    Comment: (NOTE) The Xpert Xpress SARS-CoV-2/FLU/RSV plus assay is intended as an aid in the diagnosis of influenza from Nasopharyngeal swab specimens and should not be used as a sole basis  for treatment. Nasal washings and aspirates are unacceptable for Xpert Xpress SARS-CoV-2/FLU/RSV testing.  Fact Sheet for Patients: EntrepreneurPulse.com.au  Fact Sheet for Healthcare Providers: IncredibleEmployment.be  This test is not yet approved or cleared by the [place] FDA and has been authorized for detection and/or diagnosis of SARS-CoV-2 by FDA under an Emergency Use Authorization (EUA). This EUA will remain in effect (meaning this test can be used) for the duration of the COVID-19 declaration under Section 564(b)(1) of the Act, 21 U.S.C. section 360bbb-3(b)(1), unless the authorization is terminated or revoked.     Resp Syncytial Virus by PCR NEGATIVE NEGATIVE    Comment: (NOTE) Fact Sheet for Patients: EntrepreneurPulse.com.au  Fact Sheet for Healthcare Providers: IncredibleEmployment.be  This test is not yet approved or cleared by the [place] FDA and has been authorized for detection and/or diagnosis of SARS-CoV-2 by FDA under an Emergency Use Authorization (EUA). This EUA will remain in effect (meaning this test can be used) for the duration of the COVID-19 declaration under Section 564(b)(1) of the Act, 21 U.S.C. section 360bbb-3(b)(1), unless the authorization is terminated or revoked.  Performed at [facility], [place] [address] [place], [place] [address]   Lipase, blood     Status: None   Collection Time: 07/22/22 12:23 AM  Result Value Ref Range   Lipase 27 11 - 51 U/L    Comment: Performed at [facility] [address], [place], [place] [address]  Comprehensive metabolic panel     Status: Abnormal   Collection Time: 07/22/22 12:23 AM  Result Value Ref Range   Sodium 136 135 - 145 mmol/L   Potassium 5.7 (H) 3.5 - 5.1 mmol/L   Chloride 94 (L) 98 - 111 mmol/L   CO2 21 (L) 22 - 32 mmol/L   Glucose, Bld 93 70 - 99 mg/dL    Comment: Glucose reference range  applies only to samples taken after fasting for at least 8 hours.   BUN 94 (H) 8 - 23 mg/dL   Creatinine, Ser 15.47 (H) 0.61 - 1.24 mg/dL   Calcium 8.9 8.9 - 10.3 mg/dL   Total Protein 7.5 6.5 - 8.1 g/dL   Albumin 3.5 3.5 - 5.0 g/dL   AST 22 15 - 41 U/L   ALT 19 0 - 44 U/L   Alkaline Phosphatase 110 38 - 126 U/L   Total Bilirubin 1.5 (H) 0.3 - 1.2 mg/dL   GFR, Estimated 3 (L) >60 mL/min    Comment: (NOTE) Calculated  using the CKD-EPI Creatinine Equation (2021)    Anion gap 21 (H) 5 - 15    Comment: ELECTROLYTES REPEATED TO VERIFY Performed at [facility], [place] [address], [place], [place] [address]   CBC with Differential     Status: Abnormal   Collection Time: 07/22/22 12:23 AM  Result Value Ref Range   WBC 11.0 (H) 4.0 - 10.5 K/uL   RBC 2.74 (L) 4.22 - 5.81 MIL/uL   Hemoglobin 9.0 (L) 13.0 - 17.0 g/dL   HCT 28.8 (L) 39.0 - 52.0 %   MCV 105.1 (H) 80.0 - 100.0 fL   MCH 32.8 26.0 - 34.0 pg   MCHC 31.3 30.0 - 36.0 g/dL   RDW 17.6 (H) 11.5 - 15.5 %   Platelets 183 150 - 400 K/uL   nRBC 0.0 0.0 - 0.2 %   Neutrophils Relative % 80 %   Neutro Abs 8.9 (H) 1.7 - 7.7 K/uL   Lymphocytes Relative 10 %   Lymphs Abs 1.1 0.7 - 4.0 K/uL   Monocytes Relative 8 %   Monocytes Absolute 0.9 0.1 - 1.0 K/uL   Eosinophils Relative 0 %   Eosinophils Absolute 0.0 0.0 - 0.5 K/uL   Basophils Relative 1 %   Basophils Absolute 0.1 0.0 - 0.1 K/uL   Immature Granulocytes 1 %   Abs Immature Granulocytes 0.05 0.00 - 0.07 K/uL    Comment: Performed at [facility], [place] [address] [place], [place] [address]  Magnesium     Status: Abnormal   Collection Time: 07/22/22 12:23 AM  Result Value Ref Range   Magnesium 3.3 (H) 1.7 - 2.4 mg/dL    Comment: Performed at [facility] [address], [place], [place] [address]  Brain natriuretic peptide     Status: Abnormal   Collection Time: 07/22/22 12:23 AM  Result Value Ref Range   B Natriuretic Peptide >4,500.0 (H) 0.0 - 100.0 pg/mL     Comment: Performed at [facility] [address], [place], [place] [address]   DG Chest 2 View  Result Date: 07/21/2022 CLINICAL DATA:  Shortness of breath EXAM: CHEST - 2 VIEW COMPARISON:  06/28/2022, CT 08/07/2021 FINDINGS: Cardiomegaly with vascular congestion. No pleural effusion. No focal airspace disease. Aortic atherosclerosis. IMPRESSION: Cardiomegaly with mild central congestion. Electronically Signed   By: Donavan Foil M.D.   On: 07/21/2022 22:32    Pending Labs Unresulted Labs (From admission, onward)     Start     Ordered   07/22/22 1500  Renal function panel  Daily,   R     Question:  Specimen collection method  Answer:  IV Team=IV Team collect   07/22/22 0235   07/22/22 1025  Gastrointestinal Panel by PCR , Stool  (Gastrointestinal Panel by PCR, Stool                                                                                                                                                     **  Does Not include CLOSTRIDIUM DIFFICILE testing. **If CDIFF testing is needed, place order from the "C Difficile Testing" order set.**)  Once,   R        07/22/22 1028   07/22/22 0939  CBC  Once,   R        07/22/22 0938   07/21/22 2208  Urinalysis, Routine w reflex microscopic -Urine, Clean Catch  Once,   URGENT       Question:  Specimen Source  Answer:  Urine, Clean Catch   07/21/22 2207            Vitals/Pain Today's Vitals   07/22/22 1027 07/22/22 1100 07/22/22 1130 07/22/22 1200  BP: (!) 188/101 (!) 201/108 (!) 186/93 (!) 172/77  Pulse: 77 78 84 81  Resp: (!) 32 (!) 24 (!) 29 (!) 27  Temp:      TempSrc:      SpO2: 97% 100% 100% 100%  Weight:      Height:      PainSc:        Isolation Precautions Enteric precautions (UV disinfection)  Medications Medications  amLODipine (NORVASC) tablet 10 mg (0 mg Oral Hold 07/22/22 0827)  metoprolol succinate (TOPROL-XL) 24 hr tablet 25 mg (0 mg Oral Hold 07/22/22 0826)  sacubitril-valsartan (ENTRESTO) 24-26 mg per  tablet (0 tablets Oral Hold 07/22/22 0826)  calcium acetate (PHOSLO) capsule 1,334 mg (1,334 mg Oral Given 07/22/22 0748)  multivitamin (RENA-VIT) tablet 1 tablet (has no administration in time range)  sodium chloride flush (NS) 0.9 % injection 3 mL (3 mLs Intravenous Given 07/22/22 0915)  sodium chloride flush (NS) 0.9 % injection 3 mL (has no administration in time range)  0.9 %  sodium chloride infusion (has no administration in time range)  acetaminophen (TYLENOL) tablet 650 mg (has no administration in time range)  heparin injection 5,000 Units (5,000 Units Subcutaneous Given 07/22/22 0544)  Chlorhexidine Gluconate Cloth 2 % PADS 6 each (has no administration in time range)  pentafluoroprop-tetrafluoroeth (GEBAUERS) aerosol 1 Application (has no administration in time range)  heparin injection 1,000 Units (has no administration in time range)  anticoagulant sodium citrate solution 5 mL (has no administration in time range)  alteplase (CATHFLO ACTIVASE) injection 2 mg (has no administration in time range)  fidaxomicin (DIFICID) tablet 200 mg (has no administration in time range)  sodium zirconium cyclosilicate (LOKELMA) packet 10 g (10 g Oral Given 07/22/22 0242)    Mobility walks     Focused Assessments Renal Assessment Handoff:  Hemodialysis Schedule: Hemodialysis Schedule: Tuesday/Thursday/Saturday Last Hemodialysis date and time: Thursday   Restricted appendage:    R Recommendations: See Admitting Provider Note  Report given to:   Additional Notes:

## 2022-07-22 NOTE — Assessment & Plan Note (Addendum)
BP currently normotensive. - Continue to monitor  - continue metoprolol, amlodipine, entresto - consider increasing entreso s/p dialysis - f/u after dialysis

## 2022-07-22 NOTE — Progress Notes (Signed)
Spoke to Dr. West Bali with ID about C diff antibiotic course and since Antigen positive and Toxin negative patient does not need the prolonged abx course. Recommended Fidaxomicin 240m BID x 10 days

## 2022-07-22 NOTE — Progress Notes (Signed)
OT Cancellation Note  Patient Details Name: AERO STANISLAW MRN: [id number] DOB: [birth year]   Cancelled Treatment:    Reason Eval/Treat Not Completed: Other (comment) (Pt just returned from HD, now with elevated BP. OT evaluation to f/u when appropriate.)  Elliot Cousin 07/22/2022, 4:42 PM

## 2022-07-22 NOTE — Progress Notes (Signed)
     Daily Progress Note Intern Pager: [phone]  Patient name: Albert Harris Medical record number: [id number] Date of birth: Apr 21, 1956 Age: 67 y.o. Gender: male  Primary Care Provider: Kerin Perna, NP Consultants: nephro Code Status: Full    Pt Overview and Major Events to Date:  Albert Harris is a 67 y.o. male presenting with dyspnea and diarrhea . Differential for this patient's presentation of this includes treatment failed c.diff and volume overload secondary to missed dialysis sessions. Unlikely other respiratory infection as patient is afebrile and without focal lung findings.    Assessment and Plan: * HFrEF (heart failure with reduced ejection fraction) ([place]) Last echo 06/29/2022 EF 35-40%. Most likely in exacerbation now due to 3 missed dialysis sessions causing volume overload despite diarrhea. Currently on room air.  - Admit to [facility], Attending Dr. Ardelia Mems  - nephrology following, appreciate recs - Monitor need for O2  - continue home meds metoprolol, entresto  - PT/OT eval and treat  - Daily weights, strict I&Os  Diarrhea C diff recurrence - positive PCR. Received treatment of fidaxomicin during last admission     - enteric precautions - start fidaxomicin x 10 days - consult ID - f/u GI path panel  Hypertension Patient was very hypertensive on arrival to ED with SBP> 190. Most likely due to missed dialysis sessions  - Resume metoprolol, amlodipine, entresto  - Continue to monitor  - f/u after dialysis  Hyperkalemia K 5.7 on admission due to missed dialysis. Received 10 mg lokelma in the ED.  - Cardiac telemetry  - Renal diet  - per nephro as above  FEN/GI: Renal diet with 1200 mL fluid restriction  VTE Prophylaxis: heparin   Subjective:  Patient uncomfortable because he had an impending bowel movement. He states his blood pressure has gotten up to 163 systolic and was not surprised it is so high at the moment. He endorses severe itching  uncontrolled by prescribed medications whenever he undergoes dialysis - he requests for the itching to be better controlled.  Objective: Temp:  [97.9 F (36.6 C)-99.1 F (37.3 C)] 98.7 F (37.1 C) (02/14 1015) Pulse Rate:  [77-92] 84 (02/14 1130) Resp:  [18-32] 29 (02/14 1130) BP: (161-205)/(85-111) 186/93 (02/14 1130) SpO2:  [92 %-100 %] 100 % (02/14 1130) Weight:  [65.8 kg-68.4 kg] 68.4 kg (02/14 1015) Physical Exam: General: in no acute distress HEENT: normocephalic and atraumatic Respiratory: clear to auscultation bilaterally posteriorly, non-labored breathing, and on RA Extremities: moving all extremities spontaneously Gastrointestinal: non-tender Cardiovascular: regular rate  Laboratory: Most recent CBC Lab Results  Component Value Date   WBC 11.0 (H) 07/22/2022   HGB 9.0 (L) 07/22/2022   HCT 28.8 (L) 07/22/2022   MCV 105.1 (H) 07/22/2022   PLT 183 07/22/2022   Most recent BMP    Latest Ref Rng & Units 07/22/2022   12:23 AM  BMP  Glucose 70 - 99 mg/dL 93   BUN 8 - 23 mg/dL 94   Creatinine 0.61 - 1.24 mg/dL 15.47   Sodium 135 - 145 mmol/L 136   Potassium 3.5 - 5.1 mmol/L 5.7   Chloride 98 - 111 mmol/L 94   CO2 22 - 32 mmol/L 21   Calcium 8.9 - 10.3 mg/dL 8.9    Camelia Phenes, MD 07/22/2022, 11:45 AM  PGY-1, [place] Intern pager: [phone], text pages welcome Secure chat group [place]

## 2022-07-22 NOTE — H&P (Signed)
Hospital Admission History and Physical Service Pager: [phone]  Patient name: Albert Harris Medical record number: [id number] Date of Birth: Jun 24, 1955 Age: 67 y.o. Gender: male  Primary Care Provider: Kerin Perna, NP Consultants: None  Code Status: Full  Preferred Emergency Contact:  Contact Information     Name Relation Home Work [place] Spouse [phone]  [phone]   [place] Daughter   [phone]     Confirmed with patient   Chief Complaint: Dyspnea, diarrhea   Assessment and Plan: Albert Harris is a 67 y.o. male presenting with dyspnea and diarrhea . Differential for this patient's presentation of this includes treatment failed c.diff and volume overload secondary to missed dialysis sessions. Unlikely other respiratory infection as patient is afebrile and without focal lung findings.   * HFrEF (heart failure with reduced ejection fraction) ([place]) Last echo 06/29/2022 EF 35-40%. Most likely in exacerbation now due to 3 missed dialysis sessions causing volume overload despite diarrhea. Currently on room air.  - Admit to [facility], Attending Dr. Ardelia Mems  - Consult Nephrology for dialysis  - Monitor need for O2  - Start home meds metoprolol, entresto  - PT eval and treat  - Daily weights   Diarrhea Treatment failure of C diff. Received treatment of fidaxomicin during last admission     - f/u c diff pcr test  - enteric precautions  - Treat with vancomycin if positive   Hypertension Patient was very hypertensive on arrival to ED with SBP> 190. Most likely due to missed dialysis sessions  - Resume metoprolol, amlodipine, entresto  - Continue to monitor  - F/u after dialysis   Hyperkalemia K 5.7 on admission due to missed dialysis. Received 10 mg lokelma in the ED.  - Cardiac telemetry  - Renal diet  - Consult Nephro for dialysis    FEN/GI: Renal diet with 1200 mL fluid restriction  VTE Prophylaxis: heparin   Disposition: Admit to    History of Present Illness:  Albert Harris is a 67 y.o. male presenting with dyspnea and diarrhea.   Patient was recently discharged on 1/24 and had diarrhea at that time. He was discharged with treatment for c.diff. Patient says he took all of this treatment and had resolution of symptoms for ~3 days after which he started having diarrhea again. Says he has been having about 8 episodes of watery diarrhea per day for the last week and half.  He missed three dialysis sessions due to this diarrhea. Thus, started becoming more short of breath and edematous.  He also states his shortness of breath has gotten worse over the last week.  Patient mentions his swelling is always worse in his right leg.   No fever, vomiting, chest pain or other complaints Says he never started entresto that was prescribed after discharge as he saw that "this medication is just the same as the others, but with different name". H feels like like he has a large pill burden.   In the ED, patient is stable but with elevated blood pressures up to systolics of [id number]. CXR shows vascular congestion. BNP > 4500. Cr 15.47 from 8.36 at discharge. K 5.7. He was given 10 mg of lokelma. EKG did not show any changes. Quad infectious panel was negative.   Review Of Systems: Per HPI with the following additions: diarrhea, dyspnea, edema   Pertinent Past Medical History: ESRD on dialysis TTS  HFrEF (EF35-40%)  Liver Cirrhosis  COPD Hx of stroke PAD  Hx of lymphedema of R leg Remainder reviewed in history tab.   Pertinent Past Surgical History: BL LE femoral bypass    Remainder reviewed in history tab.   Pertinent Social History: Tobacco use: Former 2ppd until 2 years ago  Alcohol use: Former 6 pack of beer per day from 67 yo until 67 yo.  Other Substance use: No  Lives with wife   Pertinent Family History: Mother: HTN Father: Colon cancer  Remainder reviewed in history tab.   Important Outpatient  Medications: Amlodipine Metoprolol Phoslo Dialyvite Entresto (not taking)  Remainder reviewed in medication history.   Objective: BP (!) 182/92   Pulse 86   Temp 98.2 F (36.8 C) (Oral)   Resp (!) 29   Ht 5' 8"$  (1.727 m)   Wt 68 kg   SpO2 94%   BMI 22.81 kg/m  Exam: General: Chronically ill appearing, in no acute distress  Eyes: No scleral icterus  Cardiovascular: RRR, radial pulses palpable and equal, cap refill = 2 seconds, 1+ pitting edema in BLE, slightly more in right leg  Respiratory: Tachypnea, increased work of breathing, no retractions, faint inspiratory crackles at the bases, otherwise CTAB Gastrointestinal: Protuberant slightly distended, but soft, non tender to palpation  MSK: Good mobility and strength  Derm: Jaundiced  Neuro: Alert and oriented x4, no asterixis   Labs:  CBC BMET  Recent Labs  Lab 07/22/22 0023  WBC 11.0*  HGB 9.0*  HCT 28.8*  PLT 183   Recent Labs  Lab 07/22/22 0023  NA 136  K 5.7*  CL 94*  CO2 21*  BUN 94*  CREATININE 15.47*  GLUCOSE 93  CALCIUM 8.9    Pertinent additional labs BNP > 4500.  EKG: NSR, QTC 464, no peaked t waves    Imaging Studies Performed: CXR : cardiomegaly with vascular congestion, no pleural effusions   Lowry Ram, MD 07/22/2022, 2:57 AM PGY-1, [place] Intern pager: [phone], text pages welcome Secure chat group [place]

## 2022-07-22 NOTE — Consult Note (Signed)
Reason for Consult: Hyperkalemia and volume overload in patient with ESRD Referring Physician: Chrisandra Netters, MD (FMTS)  HPI:  67 year old man with past medical history significant for hypertension, chronic obstructive lung disease, history of CVA, diastolic heart failure, prior history of heavy alcohol use with imaging indicative of liver cirrhosis morphology, peripheral vascular disease and end-stage renal disease on hemodialysis.  Presented to the emergency room overnight after being discharged from the hospital about 3 weeks ago with complaints of increasing shortness of breath/leg swelling after missing a week of dialysis because of diarrhea.  Dialysis prescription: [facility], TTS, 180 dialyzer, 4 hours, BFR 350/DFR 800, EDW 65.5 kg, 3K/2.5 calcium, UF profile #2, right forearm AV graft.  Mircera 75 mcg every 2 weeks, Venofer 50 mg weekly, calcitriol 0.5 mcg 3 times weekly, no heparin, Korsuva 0.7 mL 3 times weekly  Past Medical History:  Diagnosis Date   Acute metabolic encephalopathy XX123456   Acute on chronic diastolic CHF (congestive heart failure) ([place]) 12/15/2020   Acute respiratory disease due to COVID-19 virus 06/16/2020   Alcohol use disorder 06/30/2022   Anemia    Asthma    Bacteremia due to Gram-positive bacteria 05/09/2021   Carotid stenosis 09/12/2019   CHF (congestive heart failure) (HCC)    Claudication in peripheral vascular disease ([place]) 123456   Complication of anesthesia    " i HAVE A HARD TIME WAKING UP " (only one time)   COPD (chronic obstructive pulmonary disease) ([place])    COVID-19    positive on  06/16/20   Critical limb ischemia of right lower extremity (HCC)    ESRD on hemodialysis ([place])    t, th. sat dialysis   GERD (gastroesophageal reflux disease)    HLD (hyperlipidemia)    Hypertension    Hypertensive urgency 12/24/2016   Lower GI bleed    Peripheral vascular disease (HCC)    PFO with atrial septal aneurysm    by  echo 11/2017   Pneumonia    Stroke [facility])    Surgical wound infection 09/20/2021   [name] (transient ischemic attack) 11/2017   Tobacco abuse    quit 07/17/19   Volume overload 12/14/2020    Past Surgical History:  Procedure Laterality Date   ABDOMINAL AORTOGRAM W/LOWER EXTREMITY N/A 06/21/2020   Procedure: ABDOMINAL AORTOGRAM W/LOWER EXTREMITY;  Surgeon: Angelia Mould, MD;  Location: [place] [facility];  Service: Cardiovascular;  Laterality: N/A;   ABDOMINAL AORTOGRAM W/LOWER EXTREMITY Right 08/29/2021   Procedure: ABDOMINAL AORTOGRAM W/LOWER EXTREMITY;  Surgeon: Cherre Robins, MD;  Location: [place] [facility];  Service: Cardiovascular;  Laterality: Right;   APPLICATION OF WOUND VAC Right 09/21/2021   Procedure: APPLICATION OF WOUND VAC;  Surgeon: Angelia Mould, MD;  Location: [place];  Service: Vascular;  Laterality: Right;   AV FISTULA PLACEMENT Left 03/28/2020   Procedure: ARTERIOVENOUS (AV) FISTULA CREATION LEFT;  Surgeon: [name], MD;  Location: [place];  Service: Vascular;  Laterality: Left;   AV FISTULA PLACEMENT Right 09/23/2020   Procedure: RIGHT Arm ARTERIOVENOUS GRAFT CREATION.;  Surgeon: Angelia Mould, MD;  Location: [place];  Service: Vascular;  Laterality: Right;   COLONOSCOPY     COLONOSCOPY WITH PROPOFOL N/A 06/27/2020   Procedure: COLONOSCOPY WITH PROPOFOL;  Surgeon: Carol Ada, MD;  Location: [place];  Service: Endoscopy;  Laterality: N/A;   DRAINAGE AND CLOSURE OF LYMPHOCELE Right 09/21/2021   Procedure: EVACUATION OF LYMPHOCELE RIGHT GROIN;  Surgeon: Angelia Mould, MD;  Location:  [place] OR;  Service: Vascular;  Laterality: Right;   ENDARTERECTOMY FEMORAL Right 09/05/2021   Procedure: ENDARTERECTOMY FEMORAL;  Surgeon: Angelia Mould, MD;  Location: [place];  Service: Vascular;  Laterality: Right;   FEMORAL-POPLITEAL BYPASS GRAFT Right 09/05/2021   Procedure: RIGHT FEMORAL-POPLITEAL ARTERY BYPASS WITH Spaphenous Vein;   Surgeon: Angelia Mould, MD;  Location: [place];  Service: Vascular;  Laterality: Right;   [place]  06/27/2020   Procedure: HEMOSTASIS CLIP PLACEMENT;  Surgeon: Carol Ada, MD;  Location: [place];  Service: Endoscopy;;   INSERTION OF DIALYSIS CATHETER Right 06/24/2020   Procedure: INSERTION OF TUNNELED  DIALYSIS CATHETER; Removal of temporary dialysis catheter right neck;  Surgeon: Angelia Mould, MD;  Location: [place];  Service: Vascular;  Laterality: Right;   IR PARACENTESIS  06/30/2022   LIGATION OF ARTERIOVENOUS  FISTULA Left 07/24/2020   Procedure: LIGATION OF LEFT BRACHIOCEPHALIC ARTERIOVENOUS  FISTULA;  Surgeon: [name], MD;  Location: [place];  Service: Vascular;  Laterality: Left;   PERIPHERAL VASCULAR INTERVENTION Right 06/21/2020   Procedure: PERIPHERAL VASCULAR INTERVENTION;  Surgeon: Angelia Mould, MD;  Location: [place] [facility];  Service: Cardiovascular;  Laterality: Right;   TEE WITHOUT CARDIOVERSION N/A 05/13/2021   Procedure: TRANSESOPHAGEAL ECHOCARDIOGRAM (TEE);  Surgeon: Donato Heinz, MD;  Location: [facility] ENDOSCOPY;  Service: Cardiovascular;  Laterality: N/A;   VEIN HARVEST Right 09/05/2021   Procedure: RIGHT Saphenous VEIN HARVEST;  Surgeon: Angelia Mould, MD;  Location: [facility] OR;  Service: Vascular;  Laterality: Right;    Family History  Problem Relation Age of Onset   Hypertension Mother    Colon cancer Father    Stomach cancer Brother     Social History:  reports that he quit smoking about 3 years ago. His smoking use included cigarettes. He has a 50.00 pack-year smoking history. He has never used smokeless tobacco. He reports that he does not currently use alcohol after a past usage of about 1.0 - 3.0 standard drink of alcohol per week. He reports that he does not use drugs.  Allergies:  Allergies  Allergen Reactions   Lidocaine-Prilocaine Rash    Caused red marks up and down  arm    Medications: I have reviewed the patient's current medications. Scheduled:  amLODipine  10 mg Oral Daily   calcium acetate  1,334 mg Oral TID WC   Chlorhexidine Gluconate Cloth  6 each Topical Q0600   heparin  5,000 Units Subcutaneous Q8H   metoprolol succinate  25 mg Oral Daily   multivitamin  1 tablet Oral QHS   sacubitril-valsartan  1 tablet Oral BID   sodium chloride flush  3 mL Intravenous Q12H   Continuous:  sodium chloride         Latest Ref Rng & Units 07/22/2022   12:23 AM 07/01/2022    7:35 AM 06/30/2022    8:15 AM  BMP  Glucose 70 - 99 mg/dL 93  94  111   BUN 8 - 23 mg/dL 94  23  49   Creatinine 0.61 - 1.24 mg/dL 15.47  8.36  16.43   Sodium 135 - 145 mmol/L 136  138  139   Potassium 3.5 - 5.1 mmol/L 5.7  3.0  3.1   Chloride 98 - 111 mmol/L 94  96  99   CO2 22 - 32 mmol/L 21  29  26   $ Calcium 8.9 - 10.3 mg/dL 8.9  8.7  8.8  Latest Ref Rng & Units 07/22/2022   12:23 AM 07/01/2022    7:35 AM 06/29/2022    4:38 AM  CBC  WBC 4.0 - 10.5 K/uL 11.0  8.1  8.4   Hemoglobin 13.0 - 17.0 g/dL 9.0  8.6  8.6   Hematocrit 39.0 - 52.0 % 28.8  27.5  26.9   Platelets 150 - 400 K/uL 183  124  123      DG Chest 2 View  Result Date: 07/21/2022 CLINICAL DATA:  Shortness of breath EXAM: CHEST - 2 VIEW COMPARISON:  06/28/2022, CT 08/07/2021 FINDINGS: Cardiomegaly with vascular congestion. No pleural effusion. No focal airspace disease. Aortic atherosclerosis. IMPRESSION: Cardiomegaly with mild central congestion. Electronically Signed   By: Donavan Foil M.D.   On: 07/21/2022 22:32    Review of Systems  Constitutional:  Positive for chills and fatigue. Negative for appetite change and fever.  HENT:  Negative for sinus pressure, sinus pain, sore throat and trouble swallowing.   Eyes:  Negative for photophobia and visual disturbance.  Respiratory:  Positive for shortness of breath. Negative for cough, chest tightness and wheezing.   Cardiovascular:  Positive for leg  swelling.       Right leg >left  Gastrointestinal:  Positive for abdominal distention and diarrhea. Negative for abdominal pain, blood in stool, nausea and vomiting.  Endocrine: Negative for polydipsia and polyuria.  Genitourinary:  Negative for dysuria, hematuria and urgency.  Musculoskeletal:  Positive for myalgias.       Right thigh  Skin:  Negative for rash and wound.  Neurological:  Positive for weakness. Negative for dizziness and light-headedness.   Blood pressure (!) 187/96, pulse 80, temperature 98.1 F (36.7 C), resp. rate (!) 22, height 5' 8"$  (1.727 m), weight 68 kg, SpO2 93 %. Physical Exam Vitals and nursing note reviewed.  Constitutional:      Appearance: He is well-developed and normal weight. He is ill-appearing.  HENT:     Head: Normocephalic and atraumatic.     Mouth/Throat:     Mouth: Mucous membranes are moist.     Pharynx: Oropharynx is clear.  Eyes:     Extraocular Movements: Extraocular movements intact.  Neck:     Vascular: JVD present. No hepatojugular reflux.  Cardiovascular:     Rate and Rhythm: Normal rate and regular rhythm.     Heart sounds: Normal heart sounds.  Pulmonary:     Effort: Pulmonary effort is normal.     Breath sounds: Examination of the right-lower field reveals rales. Examination of the left-lower field reveals rales. Rales present.  Abdominal:     General: Bowel sounds are normal.     Palpations: Abdomen is soft. There is no mass.     Tenderness: There is no abdominal tenderness.  Musculoskeletal:        General: Normal range of motion.     Cervical back: Normal range of motion and neck supple.     Right lower leg: Edema present.     Left lower leg: Edema present.     Comments: 3+ edema right lower extremity, 1+ edema left lower extremity.  Right forearm loop arteriovenous graft with palpable thrill  Skin:    General: Skin is warm and dry.     Coloration: Skin is not pale.  Neurological:     General: No focal deficit present.      Mental Status: He is alert and oriented to person, place, and time.     Assessment/Plan: 1.  Volume  overload: Secondary to missed dialysis/cumulative volume buildup despite losses from diarrhea.  The plan is to undertake hemodialysis today 2.  End-stage renal disease: Routinely on a TTS dialysis schedule and has missed dialysis for the past week because of diarrhea.  Previous history notable for suboptimal adherence with hemodialysis/treatment recommendations. 3.  Diarrhea: Status post recent treatment with fidaxomicin for C. difficile colitis.  Plans noted for C. difficile PCR testing after initial stool test positive for C. difficile toxin. 4.  Hypertension: Secondary to missed dialysis/volume overload, monitor with hemodialysis/UF and continue metoprolol and Entresto. 5.  Hyperkalemia: Managed with hemodialysis today. 6.  Anemia: Secondary to chronic illness and recently compounded by acute illness/hospitalization.  Resume ESA. 7.  Secondary hyperparathyroidism: Calcium level acceptable, continue to follow phosphorus and restart calcitriol for PTH control.  Hibo Blasdell K. 07/22/2022, 7:45 AM

## 2022-07-22 NOTE — Progress Notes (Signed)
Heart Failure Navigator Progress Note  Assessed for Heart & Vascular TOC clinic readiness.  Patient does not meet criteria due to ESRD on hemodialysis.   Navigator will sign off at this time.    Aubreyana Saltz, BSN, RN Heart Failure Nurse Navigator Secure Chat Only   

## 2022-07-22 NOTE — Assessment & Plan Note (Addendum)
Last echo 06/29/2022 EF 35-40%. Most likely in exacerbation now due to 3 missed dialysis sessions causing volume overload despite diarrhea. Currently on room air.  - Admit to [facility], Attending Dr. Ardelia Mems  - nephrology following, appreciate recs - Monitor need for O2  - continue home meds metoprolol, entresto  - PT/OT eval and treat  - Daily weights, strict I&Os

## 2022-07-22 NOTE — ED Provider Notes (Signed)
[facility] Provider Note   CSN: [id number] Arrival date & time: 07/21/22  2150     History  Chief Complaint  Patient presents with   Shortness of Breath   Diarrhea    Albert Harris is a 67 y.o. male.  67 year old male presents today for evaluation of diarrhea, shortness of breath.  He was recently admitted for similar complaints and was discharged on 1/24.  He does have history of ESRD on dialysis on Tuesdays, Thursday, Saturday schedule, HFrEF, recently diagnosed cirrhosis.  He states he has missed the last 3 dialysis sessions due to diarrhea.  He was diagnosed with C. difficile and states he completed the antibiotics and had resolution of his symptoms for about 4 days.  He states that he has about 8 episodes of watery diarrhea per day for the past week and a half.  Reports worsening shortness of breath over the past week as well.  Endorses asymmetrical leg swelling worse than the right.  He states this is chronic and always the case.  Denies any blood in stool.  Patient does not make urine.  The history is provided by the patient. No language interpreter was used.       Home Medications Prior to Admission medications   Medication Sig Start Date End Date Taking? Authorizing Provider  albuterol (VENTOLIN HFA) 108 (90 Base) MCG/ACT inhaler INHALE 1-2 PUFFS INTO THE LUNGS EVERY 6 (SIX) HOURS AS NEEDED FOR WHEEZING OR SHORTNESS OF BREATH. Patient not taking: Reported on 06/28/2022 07/02/20 02/14/22  Delora Fuel, MD  amLODipine (NORVASC) 10 MG tablet Take 1 tablet (10 mg total) by mouth daily. 05/07/22   Kerin Perna, NP  B Complex-C-Zn-Folic Acid (DIALYVITE/ZINC) TABS Take 1 tablet by mouth daily. 04/25/22   [provider]  calcium acetate (PHOSLO) 667 MG capsule Take 2 capsule by mouth three times a day with meals Patient taking differently: Take 1,334 mg by mouth 3 (three) times daily with meals. 06/17/21     metoprolol  succinate (TOPROL-XL) 25 MG 24 hr tablet Take 1 tablet (25 mg total) by mouth daily. 07/01/22   Precious Gilding, DO  sacubitril-valsartan (ENTRESTO) 24-26 MG Take 1 tablet by mouth 2 (two) times daily. 07/01/22   Precious Gilding, DO      Allergies    Lidocaine-prilocaine    Review of Systems   Review of Systems  Respiratory:  Positive for cough (Chronic) and shortness of breath.   Neurological:  Negative for light-headedness.  All other systems reviewed and are negative.   Physical Exam Updated Vital Signs BP (!) 161/92   Pulse 86   Temp 98.2 F (36.8 C) (Oral)   Resp (!) 28   Ht 5' 8"$  (1.727 m)   Wt 68 kg   SpO2 95%   BMI 22.81 kg/m  Physical Exam Vitals and nursing note reviewed.  Constitutional:      General: He is not in acute distress.    Appearance: Normal appearance. He is not ill-appearing.  HENT:     Head: Normocephalic and atraumatic.     Nose: Nose normal.  Eyes:     General: No scleral icterus.    Extraocular Movements: Extraocular movements intact.     Conjunctiva/sclera: Conjunctivae normal.  Cardiovascular:     Rate and Rhythm: Normal rate and regular rhythm.     Pulses: Normal pulses.  Pulmonary:     Effort: Pulmonary effort is normal. No respiratory distress.  Breath sounds: Normal breath sounds. No wheezing or rales.  Abdominal:     General: There is no distension.     Palpations: Abdomen is soft.     Tenderness: There is no abdominal tenderness. There is no guarding or rebound.  Musculoskeletal:        General: Normal range of motion.     Cervical back: Normal range of motion.     Right lower leg: Edema (non pitting edema) present.     Left lower leg: No edema.  Skin:    General: Skin is warm and dry.  Neurological:     General: No focal deficit present.     Mental Status: He is alert. Mental status is at baseline.     ED Results / Procedures / Treatments   Labs (all labs ordered are listed, but only abnormal results are displayed) Labs  Reviewed  COMPREHENSIVE METABOLIC PANEL - Abnormal; Notable for the following components:      Result Value   Potassium 5.7 (*)    Chloride 94 (*)    CO2 21 (*)    BUN 94 (*)    Creatinine, Ser 15.47 (*)    Total Bilirubin 1.5 (*)    GFR, Estimated 3 (*)    Anion gap 21 (*)    All other components within normal limits  CBC WITH DIFFERENTIAL/PLATELET - Abnormal; Notable for the following components:   WBC 11.0 (*)    RBC 2.74 (*)    Hemoglobin 9.0 (*)    HCT 28.8 (*)    MCV 105.1 (*)    RDW 17.6 (*)    Neutro Abs 8.9 (*)    All other components within normal limits  MAGNESIUM - Abnormal; Notable for the following components:   Magnesium 3.3 (*)    All other components within normal limits  BRAIN NATRIURETIC PEPTIDE - Abnormal; Notable for the following components:   B Natriuretic Peptide >4,500.0 (*)    All other components within normal limits  C DIFFICILE QUICK SCREEN W PCR REFLEX    RESP PANEL BY RT-PCR (RSV, FLU A&B, COVID)  RVPGX2  LIPASE, BLOOD  URINALYSIS, ROUTINE W REFLEX MICROSCOPIC    EKG None  Radiology DG Chest 2 View  Result Date: 07/21/2022 CLINICAL DATA:  Shortness of breath EXAM: CHEST - 2 VIEW COMPARISON:  06/28/2022, CT 08/07/2021 FINDINGS: Cardiomegaly with vascular congestion. No pleural effusion. No focal airspace disease. Aortic atherosclerosis. IMPRESSION: Cardiomegaly with mild central congestion. Electronically Signed   By: Donavan Foil M.D.   On: 07/21/2022 22:32    Procedures Procedures    Medications Ordered in ED Medications - No data to display  ED Course/ Medical Decision Making/ A&P Clinical Course as of 07/22/22 0131  Wed Jul 22, 2022  0124 EKG 12-Lead [AA]    Clinical Course User Index [AA] Evlyn Courier, PA-C                             Medical Decision Making Amount and/or Complexity of Data Reviewed Labs: ordered. Radiology: ordered. ECG/medicine tests:  Decision-making details documented in ED  Course.  Risk Prescription drug management. Decision regarding hospitalization.   Medical Decision Making / ED Course   This patient presents to the ED for concern of shortness of breath, diarrhea, this involves an extensive number of treatment options, and is a complaint that carries with it a high risk of complications and morbidity.  The differential diagnosis includes recurrent  C. difficile, CHF exacerbation, volume overload from missing dialysis, PE, ACS  MDM: 67 year old male presents today for evaluation of diarrhea, shortness of breath.  Not in acute respiratory distress.  Maintaining O2 sats of upper 90s on room air.  Does have some conversational dyspnea.  Has HFrEF with EF of 35-40%.  Missed the last 3 dialysis sessions.  Recently diagnosed cirrhosis.  His hepatitis serologies were negative.  He did have ascites during the recent admission.  He underwent paracentesis which was consistent with ascites that was cardiac in nature.  He does not make urine.  CBC shows mild leukocytosis with no significant left shift.  Hemoglobin of 9.0 which is around his baseline.  CMP shows creatinine of 15.47 which is doubled from his baseline.  BUN at 94 which is uptrending.  Potassium 5.7.  No acute hyperkalemic EKG changes.  Will give 10 g of Lokelma.  CMP otherwise without acute concerns.  Does have anion gap of 21 which is likely from GI illness.  BNP is greater than 4500.  This is likely multifactorial from volume overload as well as recent start of Entresto.  Chest x-ray does show evidence of vascular congestion.  Magnesium 3.3.  C. difficile screen ordered.  Respiratory panel in process.  Given signs of volume overload, and the fact that he missed the past 3 sessions of his dialysis will do so discussed with family medicine team regarding admission.  He would benefit from dialysis session in the morning.  Does not feel he has any emergent indication for dialysis at this time.  Discussed with family  practice who will evaluate patient for admission.  Lab Tests: -I ordered, reviewed, and interpreted labs.   The pertinent results include:   Labs Reviewed  COMPREHENSIVE METABOLIC PANEL - Abnormal; Notable for the following components:      Result Value   Potassium 5.7 (*)    Chloride 94 (*)    CO2 21 (*)    BUN 94 (*)    Creatinine, Ser 15.47 (*)    Total Bilirubin 1.5 (*)    GFR, Estimated 3 (*)    Anion gap 21 (*)    All other components within normal limits  CBC WITH DIFFERENTIAL/PLATELET - Abnormal; Notable for the following components:   WBC 11.0 (*)    RBC 2.74 (*)    Hemoglobin 9.0 (*)    HCT 28.8 (*)    MCV 105.1 (*)    RDW 17.6 (*)    Neutro Abs 8.9 (*)    All other components within normal limits  MAGNESIUM - Abnormal; Notable for the following components:   Magnesium 3.3 (*)    All other components within normal limits  BRAIN NATRIURETIC PEPTIDE - Abnormal; Notable for the following components:   B Natriuretic Peptide >4,500.0 (*)    All other components within normal limits  C DIFFICILE QUICK SCREEN W PCR REFLEX    RESP PANEL BY RT-PCR (RSV, FLU A&B, COVID)  RVPGX2  LIPASE, BLOOD  URINALYSIS, ROUTINE W REFLEX MICROSCOPIC      EKG  EKG Interpretation  Date/Time:    Ventricular Rate:    PR Interval:    QRS Duration:   QT Interval:    QTC Calculation:   R Axis:     Text Interpretation:           Imaging Studies ordered: I ordered imaging studies including chest x-ray I independently visualized and interpreted imaging. I agree with the radiologist interpretation   Medicines ordered  and prescription drug management: Meds ordered this encounter  Medications   sodium zirconium cyclosilicate (LOKELMA) packet 10 g    -I have reviewed the patients home medicines and have made adjustments as needed   Reevaluation: After the interventions noted above, I reevaluated the patient and found that they have :stayed the same  Co morbidities that  complicate the patient evaluation  Past Medical History:  Diagnosis Date   Acute metabolic encephalopathy XX123456   Acute on chronic diastolic CHF (congestive heart failure) ([place]) 12/15/2020   Acute respiratory disease due to COVID-19 virus 06/16/2020   Alcohol use disorder 06/30/2022   Anemia    Asthma    Bacteremia due to Gram-positive bacteria 05/09/2021   Carotid stenosis 09/12/2019   CHF (congestive heart failure) (HCC)    Claudication in peripheral vascular disease ([place]) 123456   Complication of anesthesia    " i HAVE A HARD TIME WAKING UP " (only one time)   COPD (chronic obstructive pulmonary disease) ([place])    COVID-19    positive on  06/16/20   Critical limb ischemia of right lower extremity (HCC)    ESRD on hemodialysis ([place])    t, th. sat dialysis   GERD (gastroesophageal reflux disease)    HLD (hyperlipidemia)    Hypertension    Hypertensive urgency 12/24/2016   Lower GI bleed    Peripheral vascular disease (HCC)    PFO with atrial septal aneurysm    by echo 11/2017   Pneumonia    Stroke [facility])    Surgical wound infection 09/20/2021   [name] (transient ischemic attack) 11/2017   Tobacco abuse    quit 07/17/19   Volume overload 12/14/2020      Dispostion: Patient discussed with family practice will evaluate patient for admission.  Final Clinical Impression(s) / ED Diagnoses Final diagnoses:  Diarrhea, unspecified type  Dyspnea, unspecified type  Hypervolemia, unspecified hypervolemia type    Rx / DC Orders ED Discharge Orders     None         Evlyn Courier, PA-C 07/22/22 0210    Merryl Hacker, MD 07/22/22 [phone]

## 2022-07-22 NOTE — Assessment & Plan Note (Addendum)
C diff recurrence - positive PCR. Received treatment of fidaxomicin during last admission. Reports decreased frequency of bowel movements. GI path panel negative - enteric precautions - continue fidaxomicin 2/14 - 2/23

## 2022-07-22 NOTE — Procedures (Signed)
Patient seen on Hemodialysis. BP (!) 200/99   Pulse 86   Temp 98.7 F (37.1 C) (Oral)   Resp (!) 21   Ht 5' 8"$  (1.727 m)   Wt 68.4 kg   SpO2 97%   BMI 22.93 kg/m   QB 400, UF goal 3L Tolerating treatment without complaints at this time.   Elmarie Shiley MD [facility]. Office # [phone] Pager # [phone] 1:50 PM

## 2022-07-23 DIAGNOSIS — Z992 Dependence on renal dialysis: Secondary | ICD-10-CM

## 2022-07-23 DIAGNOSIS — N186 End stage renal disease: Secondary | ICD-10-CM | POA: Diagnosis not present

## 2022-07-23 DIAGNOSIS — I5020 Unspecified systolic (congestive) heart failure: Secondary | ICD-10-CM | POA: Diagnosis not present

## 2022-07-23 DIAGNOSIS — A0472 Enterocolitis due to Clostridium difficile, not specified as recurrent: Secondary | ICD-10-CM | POA: Diagnosis not present

## 2022-07-23 LAB — CBC
HCT: 29.2 % — ABNORMAL LOW (ref 39.0–52.0)
Hemoglobin: 9.6 g/dL — ABNORMAL LOW (ref 13.0–17.0)
MCH: 33.7 pg (ref 26.0–34.0)
MCHC: 32.9 g/dL (ref 30.0–36.0)
MCV: 102.5 fL — ABNORMAL HIGH (ref 80.0–100.0)
Platelets: 186 10*3/uL (ref 150–400)
RBC: 2.85 MIL/uL — ABNORMAL LOW (ref 4.22–5.81)
RDW: 17.4 % — ABNORMAL HIGH (ref 11.5–15.5)
WBC: 9 10*3/uL (ref 4.0–10.5)
nRBC: 0 % (ref 0.0–0.2)

## 2022-07-23 LAB — RENAL FUNCTION PANEL
Albumin: 2.9 g/dL — ABNORMAL LOW (ref 3.5–5.0)
Anion gap: 13 (ref 5–15)
BUN: 40 mg/dL — ABNORMAL HIGH (ref 8–23)
CO2: 27 mmol/L (ref 22–32)
Calcium: 8.6 mg/dL — ABNORMAL LOW (ref 8.9–10.3)
Chloride: 95 mmol/L — ABNORMAL LOW (ref 98–111)
Creatinine, Ser: 8.85 mg/dL — ABNORMAL HIGH (ref 0.61–1.24)
GFR, Estimated: 6 mL/min — ABNORMAL LOW (ref >60)
Glucose, Bld: 88 mg/dL (ref 70–99)
Phosphorus: 5.3 mg/dL — ABNORMAL HIGH (ref 2.5–4.6)
Potassium: 3.5 mmol/L (ref 3.5–5.1)
Sodium: 135 mmol/L (ref 135–145)

## 2022-07-23 MED ORDER — CALCITRIOL 0.25 MCG PO CAPS
0.5000 ug | ORAL_CAPSULE | ORAL | Status: DC
  Administered 2022-07-23: 0.5 ug via ORAL
  Filled 2022-07-23: qty 2

## 2022-07-23 NOTE — Progress Notes (Signed)
   Daily Progress Note Intern Pager: [phone]  Patient name: Albert Harris Medical record number: [id number] Date of birth: [birth year] Age: 67 y.o. Gender: male  Primary Care Provider: Kerin Perna, NP Consultants: nephro  Code Status: FULL  Pt Overview and Major Events to Date:  2/14 - admitted  Assessment and Plan: Albert Harris is a 67 y.o. male presenting with dyspnea and diarrhea . Differential for this patient's presentation of this includes treatment failed c.diff and volume overload secondary to missed dialysis sessions   * HFrEF (heart failure with reduced ejection fraction) ([place]) Last echo 06/29/2022 EF 35-40%. BNP >4,500. Most likely in exacerbation now due to 3 missed dialysis sessions causing volume overload despite diarrhea. Currently on room air.  - Admit to [facility], Attending Dr. Ardelia Mems  - nephrology following, appreciate recs - Monitor need for O2  - continue home meds metoprolol, entresto  - PT/OT eval and treat  - Daily weights, strict I&Os - f/u repeat dialysis today  Diarrhea C diff recurrence - positive PCR. Received treatment of fidaxomicin during last admission. Reports decreased frequency of bowel movements. GI path panel negative - enteric precautions - continue fidaxomicin 2/14 - 2/14  Hypertension BP currently normotensive. - Continue to monitor  - hold metoprolol, amlodipine, entresto - consider increasing entreso s/p dialysis - f/u after dialysis  FEN/GI: Renal diet with 1200 mL fluid restriction  VTE Prophylaxis: heparin  Dispo:Home pending medical improvement  Subjective:  Patient reports feeling much better compared to admission. He attributes this improvement to receiving dialysis. He reports decreased frequency of bowel movements. Denies somatic complaints.  Objective: Temp:  [97.6 F (36.4 C)-98.1 F (36.7 C)] 97.6 F (36.4 C) (02/15 1052) Pulse Rate:  [62-92] 62 (02/15 1104) Resp:  [15-30] 22 (02/15 1104) BP:  (113-205)/(59-106) 150/73 (02/15 1104) SpO2:  [90 %-100 %] 99 % (02/15 1104) Weight:  [68.6 kg] 68.6 kg (02/15 0331) Physical Exam: General: in no acute distress and well-appearing HEENT: normocephalic and atraumatic Respiratory: non-labored breathing and on RA Extremities: moving all extremities spontaneously Gastrointestinal: non-tender Cardiovascular: regular rate  Laboratory: Most recent CBC Lab Results  Component Value Date   WBC 9.0 07/23/2022   HGB 9.6 (L) 07/23/2022   HCT 29.2 (L) 07/23/2022   MCV 102.5 (H) 07/23/2022   PLT 186 07/23/2022   Most recent BMP    Latest Ref Rng & Units 07/23/2022    7:25 AM  BMP  Glucose 70 - 99 mg/dL 88   BUN 8 - 23 mg/dL 40   Creatinine 0.61 - 1.24 mg/dL 8.85   Sodium 135 - 145 mmol/L 135   Potassium 3.5 - 5.1 mmol/L 3.5   Chloride 98 - 111 mmol/L 95   CO2 22 - 32 mmol/L 27   Calcium 8.9 - 10.3 mg/dL 8.6    Albert Phenes, MD 07/23/2022, 12:11 PM  PGY-1, [place] Intern pager: [phone], text pages welcome Secure chat group [place]

## 2022-07-23 NOTE — Progress Notes (Signed)
[name] duration:3  Patient tolerated well.  Transported back to the room  Alert, without acute distress.  Hand-off given to patient's nurse.   Access used: AVG Access issues: N/A  Total UF removed: 1900 Medication(s) given: Calcitriol  Post HD weight: 65.1      07/23/22 1414  Vitals  Temp 98 F (36.7 C)  Temp Source Oral  BP (!) 159/70  MAP (mmHg) 96  BP Location Left Arm  BP Method Automatic  Patient Position (if appropriate) Lying  Pulse Rate 72  Pulse Rate Source Monitor  ECG Heart Rate 74  Resp (!) 22  Oxygen Therapy  SpO2 97 %  O2 Device Room Air  During Treatment Monitoring  HD Safety Checks Performed Yes  Intra-Hemodialysis Comments Tolerated well;Tx completed  Post Treatment  Dialyzer Clearance Lightly streaked  Duration of HD Treatment -hour(s) 3 hour(s)  Liters Processed 70  Fluid Removed (mL) 1900 mL  Tolerated HD Treatment Yes  AVG/AVF Arterial Site Held (minutes) 7 minutes  AVG/AVF Venous Site Held (minutes) 7 minutes  Fistula / Graft Right Upper arm Arteriovenous vein graft  No placement date or time found.   Placed prior to admission: Yes  Orientation: Right  Access Location: Upper arm  Access Type: Arteriovenous vein graft  Site Condition No complications  Fistula / Graft Assessment Present;Thrill;Bruit  Status Deaccessed  Needle Size 15 g  Drainage Description None       [name]

## 2022-07-23 NOTE — Hospital Course (Addendum)
Albert Harris is a 67 y.o. male who was admitted with recurrent C. Diff and volume overload secondary to missed HD.  ESRD, CHF, Volume Overload Missed 3 outpatient HD sessions due to diarrhea. Presented with shortness of breath and evidence of volume overload as well as hyperkalemia. Volume status managed with HD while inpatient.  C. Diff Recently admitted last month with C Diff infection. Completed course of Fidaxomicin but symptoms recurred. Noted to be C. Diff positive again on admission (+antigen, -toxin, +PCR). ID recommended repeat course of Fidaxomicin x10 days.  Issues for PCP follow-up: Antibiotic, dialysis compliance Repeat CMP

## 2022-07-23 NOTE — Progress Notes (Signed)
Pt receives out-pt HD at [facility] on TTS. Will assist as needed.   Melven Sartorius Renal Navigator [phone]

## 2022-07-23 NOTE — Progress Notes (Signed)
[name], male   DOB: 03-21-1956, 67 y.o.   MRN: [id number]  [facility] Progress Note   Assessment/ Plan:   1.  Volume overload: Secondary to missed dialysis/cumulative volume buildup despite losses from diarrhea.  Underwent hemodialysis yesterday with volume unloading and able to wean off of oxygen supplementation overnight with decent oxygenation recorded on monitoring.  Appears suitable for discharge home later today after dialysis. 2.  End-stage renal disease: Routinely on a TTS dialysis schedule and has missed dialysis for the past week because of diarrhea.  Underwent hemodialysis yesterday and will undergo dialysis again today.  In an ideal situation, he may have been discharged home this morning to go to his outpatient dialysis unit however, he has multiple barriers to getting there. 3.  Diarrhea: Status post recent treatment with fidaxomicin for C. difficile colitis.  Positive C. difficile PCR testing for which he is on fidaxomicin; suspect that this will be converted to oral vancomycin. 4.  Hypertension: Compounded by volume overload and partially improved with hemodialysis.  Restarted metoprolol and Entresto yesterday. 5.  Hyperkalemia: Corrected with hemodialysis, labs pending from this morning 6.  Anemia: Secondary to chronic illness and recently compounded by acute illness/hospitalization.  Resume ESA. 7.  Secondary hyperparathyroidism: Calcium level acceptable, continue to follow phosphorus and continue calcitriol for PTH control.  Subjective:   Reports to be feeling much better after hemodialysis/ultrafiltration yesterday.  No significant diarrhea overnight.   Objective:   BP (!) 165/90 (BP Location: Left Arm)   Pulse 74   Temp 97.8 F (36.6 C) (Oral)   Resp 19   Ht 5' 8"$  (1.727 m)   Wt 68.6 kg   SpO2 98%   BMI 23.00 kg/m   Physical Exam: Gen: Comfortably sitting up in recliner, watching television CVS: Pulse regular rhythm, normal rate, S1  and S2 normal Resp: Clear to auscultation, no rales/rhonchi Abd: Soft, obese, nontender, bowel sounds normal Ext: 1-2+ right lower extremity edema, trace left lower extremity edema.  Right forearm loop graft.  Labs: BMET Recent Labs  Lab 07/22/22 0023 07/23/22 0725  NA 136 135  K 5.7* 3.5  CL 94* 95*  CO2 21* 27  GLUCOSE 93 88  BUN 94* 40*  CREATININE 15.47* 8.85*  CALCIUM 8.9 8.6*  PHOS  --  5.3*   CBC Recent Labs  Lab 07/22/22 0023 07/23/22 0725  WBC 11.0* 9.0  NEUTROABS 8.9*  --   HGB 9.0* 9.6*  HCT 28.8* 29.2*  MCV 105.1* 102.5*  PLT 183 186     Medications:     amLODipine  10 mg Oral Daily   calcium acetate  1,334 mg Oral TID WC   Chlorhexidine Gluconate Cloth  6 each Topical Q0600   fidaxomicin  200 mg Oral BID   heparin  5,000 Units Subcutaneous Q8H   metoprolol succinate  25 mg Oral Daily   multivitamin  1 tablet Oral QHS   sacubitril-valsartan  1 tablet Oral BID   sodium chloride flush  3 mL Intravenous Q12H   Elmarie Shiley, MD 07/23/2022, 8:34 AM

## 2022-07-23 NOTE — Evaluation (Signed)
Physical Therapy Evaluation & Discharge Patient Details Name: Albert Harris MRN: [id number] DOB: 1955-07-15 Today's Date: 07/23/2022  History of Present Illness  [name] presenting 07/21/22 with diarrhea and shortness of breath in the setting of missed dialysis. Admitted for treatment failed c.diff and volume overload secondary to missed dialysis sessions. Recently admitted for cirrhosis and C diff colitis and discharged 07/01/22. PMH includes s/p 3/31 R CFA endarterectomy and femoral to popliteal bypass with vein, recent history of DVT, R great toe wound, ESRD on HD TTS, severe PAD, HTN, CVA, COPD with tobacco abuse, HLD, and HFrEF.   Clinical Impression  Pt presents with condition above. PTA, he was independent without AD, living with his wife in a 2-level house with 3 STE. Currently, pt demonstrates some mild balance deficits and mild L lower extremity weakness, but appears and reports to be functioning near his baseline. He does tend to stagger/sway laterally bil while ambulating and did score 19/24 on the DGI today (score of </= 19/24 on the DGI is predictive of falls in the elderly) but demonstrates appropriate reactional strategies or alters his gait appropriately to ensure his safety and prevent LOB. Pt educated on his balance deficits and risk for falls and verbalized understanding. Pt politely declining need for further follow-up PT upon d/c, even when considering these deficits. As pt is likely at his baseline and all education completed and questions answered, PT will sign off and defer further mobility to nursing and mobility techs. Thank you for this referral.     Recommendations for follow up therapy are one component of a multi-disciplinary discharge planning process, led by the attending physician.  Recommendations may be updated based on patient status, additional functional criteria and insurance authorization.  Follow Up Recommendations No PT follow up (pt declining need  for OPPT)      Assistance Recommended at Discharge PRN  Patient can return home with the following  Assistance with cooking/housework;Assist for transportation;Help with stairs or ramp for entrance    Equipment Recommendations None recommended by PT  Recommendations for Other Services       Functional Status Assessment Patient has not had a recent decline in their functional status     Precautions / Restrictions Precautions Precautions: Fall (low) Restrictions Weight Bearing Restrictions: No      Mobility  Bed Mobility Overal bed mobility: Modified Independent             General bed mobility comments: use of rails and HOB elevation, but no assistance needed    Transfers Overall transfer level: Independent Equipment used: None Transfers: Sit to/from Stand Sit to Stand: Independent           General transfer comment: Able to come to stand steadily within appropriate amount of time without assistance    Ambulation/Gait Ambulation/Gait assistance: Supervision Gait Distance (Feet): 200 Feet Assistive device: None Gait Pattern/deviations: Step-through pattern, Decreased stride length, Drifts right/left, Staggering right, Staggering left Gait velocity: reduced Gait velocity interpretation: >2.62 ft/sec, indicative of community ambulatory   General Gait Details: Pt does demonstrate some mild bil lateral staggering/swaying, but displays appropriate reactional strategies or pt alters his gait appropriately to maintain his balance, even with dynamic gait challenges. Supervision for safety.  Stairs            Wheelchair Mobility    Modified Rankin (Stroke Patients Only)       Balance Overall balance assessment: Mild deficits observed, not formally tested  Standardized Balance Assessment Standardized Balance Assessment : Dynamic Gait Index   Dynamic Gait Index Level Surface: Mild Impairment Change in Gait  Speed: Normal Gait with Horizontal Head Turns: Mild Impairment Gait with Vertical Head Turns: Mild Impairment Gait and Pivot Turn: Normal Step Over Obstacle: Mild Impairment Step Around Obstacles: Normal Steps: Mild Impairment (assumed, not tested) Total Score: 19       Pertinent Vitals/Pain Pain Assessment Pain Assessment: No/denies pain    Home Living Family/patient expects to be discharged to:: Private residence Living Arrangements: Spouse/significant other Available Help at Discharge: Family;Available 24 hours/day Type of Home: House Home Access: Stairs to enter Entrance Stairs-Rails: Right Entrance Stairs-Number of Steps: 3   Home Layout: Two level;Able to live on main level with bedroom/bathroom (laundry on second floor) Home Equipment: Conservation officer, nature (2 wheels);Wheelchair - manual;Grab bars - tub/shower;Grab bars - toilet;Hand held shower head Additional Comments: info carried over from entry 06/29/22 as pt denied any recent changes    Prior Function Prior Level of Function : Independent/Modified Independent             Mobility Comments: ambulates without AD, endorses only one fall recently due to one of their dogs knocking him down ADLs Comments: independent ADLs, limited IADLs, drive, spouse assists with meds     Hand Dominance   Dominant Hand: Right    Extremity/Trunk Assessment   Upper Extremity Assessment Upper Extremity Assessment: Defer to OT evaluation    Lower Extremity Assessment Lower Extremity Assessment: LLE deficits/detail LLE Deficits / Details: mild weakness in hip flexors and quads, MMT scores of 4 hip flexion and 4+ knee extension on L compared to 4+ hip flexion, 5 knee extension on R LLE Sensation: WNL    Cervical / Trunk Assessment Cervical / Trunk Assessment: Normal (reports hx of cervical issues)  Communication   Communication: No difficulties  Cognition Arousal/Alertness: Awake/alert Behavior During Therapy: WFL for tasks  assessed/performed Overall Cognitive Status: Within Functional Limits for tasks assessed                                          General Comments General comments (skin integrity, edema, etc.): VSS on RA; encouraged frequent mobility while here and educated pt on his risk for falls, he verbalized understanding and declined need for further acute or post-acute PT    Exercises     Assessment/Plan    PT Assessment Patient does not need any further PT services  PT Problem List Decreased strength;Decreased activity tolerance;Decreased mobility;Decreased balance       PT Treatment Interventions DME instruction;Gait training;Functional mobility training;Stair training;Therapeutic activities;Therapeutic exercise;Balance training;Neuromuscular re-education;Patient/family education    PT Goals (Current goals can be found in the Care Plan section)  Acute Rehab PT Goals Patient Stated Goal: get better PT Goal Formulation: All assessment and education complete, DC therapy Time For Goal Achievement: 07/24/22 Potential to Achieve Goals: Good    Frequency Min 2X/week     Co-evaluation               AM-PAC PT "6 Clicks" Mobility  Outcome Measure Help needed turning from your back to your side while in a flat bed without using bedrails?: None Help needed moving from lying on your back to sitting on the side of a flat bed without using bedrails?: None Help needed moving to and from a bed to a chair (including a wheelchair)?: None  Help needed standing up from a chair using your arms (e.g., wheelchair or bedside chair)?: None Help needed to walk in hospital room?: A Little Help needed climbing 3-5 steps with a railing? : A Little 6 Click Score: 22    End of Session   Activity Tolerance: Patient tolerated treatment well Patient left: in chair;with call bell/phone within reach;with chair alarm set Nurse Communication: Mobility status;Other (comment) (sats) PT Visit  Diagnosis: Unsteadiness on feet (R26.81);Other abnormalities of gait and mobility (R26.89);Muscle weakness (generalized) (M62.81);History of falling (Z91.81);Difficulty in walking, not elsewhere classified (R26.2)    Time: [id number] PT Time Calculation (min) (ACUTE ONLY): 25 min   Charges:   PT Evaluation $PT Eval Low Complexity: 1 Low PT Treatments $Gait Training: 8-22 mins        Moishe Spice, PT, DPT Acute Rehabilitation Services  Office: [phone]   Orvan Falconer 07/23/2022, 8:43 AM

## 2022-07-23 NOTE — Evaluation (Signed)
Occupational Therapy Evaluation Patient Details Name: Albert Harris MRN: [id number] DOB: May 29, 1956 Today's Date: 07/23/2022   History of Present Illness [name] presenting 07/21/22 with diarrhea and shortness of breath in the setting of missed dialysis. Admitted for treatment failed c.diff and volume overload secondary to missed dialysis sessions. Recently admitted for cirrhosis and C diff colitis and discharged 07/01/22. PMH includes s/p 3/31 R CFA endarterectomy and femoral to popliteal bypass with vein, recent history of DVT, R great toe wound, ESRD on HD TTS, severe PAD, HTN, CVA, COPD with tobacco abuse, HLD, and HFrEF.   Clinical Impression   Pt admitted as above, presenting with deficits as listed below. Pt received up in chair upon OT arrival, he appears to be at or near baseline level for ADL's and functional transfers related to ADL's. Pt is Mod I don/doffing socks while seated using figure 4 technique. Pt was CGA-Mod I functional mobility in room, on/off toilet (using grab bar), standing at sink and simulated step up into tub performed. Noted some unsteadyness/sway after simulated tub transfer however pt self corrected on his own w/o hands on assist. Discussed use of grab bars in bathroom and at tub (pt has these at home) and sitting for ADL's when not feeling well/weak secondary to illness as well as possiblity of bathing at sink level or using shower chair PRN. Also briefly discussed fall prevention strategies. Pt denies any further acute OT needs at this time, will sign off.     Recommendations for follow up therapy are one component of a multi-disciplinary discharge planning process, led by the attending physician.  Recommendations may be updated based on patient status, additional functional criteria and insurance authorization.   Follow Up Recommendations  No OT follow up     Assistance Recommended at Discharge PRN  Patient can return home with the following A little  help with bathing/dressing/bathroom;A little help with walking and/or transfers;Direct supervision/assist for medications management    Functional Status Assessment  Patient has not had a recent decline in their functional status  Equipment Recommendations  None recommended by OT    Recommendations for Other Services       Precautions / Restrictions Precautions Precautions: Fall (low) Restrictions Weight Bearing Restrictions: No      Mobility Bed Mobility Overal bed mobility:  (Pt received up in chair)   Transfers Overall transfer level: Independent Equipment used: None Transfers: Sit to/from Stand Sit to Stand: Independent   General transfer comment: Able to come to stand steadily within appropriate amount of time without assistance      Balance Overall balance assessment: Mild deficits observed, not formally tested Sitting-balance support: No upper extremity supported, Feet supported Sitting balance-Leahy Scale: Good   Standing balance support: No upper extremity supported, During functional activity Standing balance-Leahy Scale: Fair     ADL either performed or assessed with clinical judgement   ADL Overall ADL's : At baseline   General ADL Comments: Pt received up in chair upon OT arrival, he appears to be at or near baseline level for ADL's and functional transfers related to ADL's. Pt is Mod I don/doffing socks while seated using figure 4 technique. Pt was CGA-Mod I functional mobility in room, on/off toilet (using grab bar), standing at sink and simulated step up into tub performed. Noted some unsteadyness/sway after simulated tub transfer however pt self corrected on his own w/o hands on assist. Discussed use of grab bars in bathroom and at tub (pt has these  at home) and sitting for ADL's when not feeling well/weak secondary to illness as well as possiblity of bathing at sink level PRN. Also briefly discussed fall prevention strategies. Pt denies any further acute  OT needs at this time, will sign off.     Vision Ability to See in Adequate Light: 0 Adequate Patient Visual Report: No change from baseline Vision Assessment?: No apparent visual deficits            Pertinent Vitals/Pain Pain Assessment Pain Assessment: No/denies pain     Hand Dominance Right   Extremity/Trunk Assessment Upper Extremity Assessment Upper Extremity Assessment: Overall WFL for tasks assessed   Lower Extremity Assessment Lower Extremity Assessment: Defer to PT evaluation LLE Deficits / Details: mild weakness in hip flexors and quads, MMT scores of 4 hip flexion and 4+ knee extension on L compared to 4+ hip flexion, 5 knee extension on R LLE Sensation: WNL   Cervical / Trunk Assessment Cervical / Trunk Assessment: Normal (reports hx of cervical issues)   Communication Communication Communication: No difficulties   Cognition Arousal/Alertness: Awake/alert Behavior During Therapy: WFL for tasks assessed/performed Overall Cognitive Status: Within Functional Limits for tasks assessed     General Comments  VSS on RA            Home Living Family/patient expects to be discharged to:: Private residence Living Arrangements: Spouse/significant other Available Help at Discharge: Family;Available 24 hours/day Type of Home: House Home Access: Stairs to enter [employer] of Steps: 3 Entrance Stairs-Rails: Right Home Layout: Two level;Able to live on main level with bedroom/bathroom   Alternate Level Stairs-Rails:  (unsure) Bathroom Shower/Tub: Teacher, early years/pre: Standard     Home Equipment: Conservation officer, nature (2 wheels);Wheelchair - manual;Grab bars - tub/shower;Grab bars - toilet;Hand held shower head   Additional Comments: info confirmed/carried over from entry 06/29/22 as pt denied any recent changes      Prior Functioning/Environment Prior Level of Function : Independent/Modified Independent   Mobility Comments: ambulates  without AD, endorses only one fall recently due to one of their dogs knocking him down ADLs Comments: independent ADLs, limited IADLs, drive, spouse assists with meds        OT Problem List: Impaired balance (sitting and/or standing)      OT Treatment/Interventions:   Eval only   OT Goals(Current goals can be found in the care plan section) Acute Rehab OT Goals Patient Stated Goal: Go home OT Goal Formulation: All assessment and education complete, DC therapy Time For Goal Achievement: 07/23/22 Potential to Achieve Goals: Good  OT Frequency: Other (comment) (Eval only - sign off)       AM-PAC OT "6 Clicks" Daily Activity     Outcome Measure Help from another person eating meals?: None Help from another person taking care of personal grooming?: None Help from another person toileting, which includes using toliet, bedpan, or urinal?: None Help from another person bathing (including washing, rinsing, drying)?: None Help from another person to put on and taking off regular upper body clothing?: None Help from another person to put on and taking off regular lower body clothing?: None 6 Click Score: 24   End of Session Equipment Utilized During Treatment:  (None) Nurse Communication: Other (comment) (Sign off OT, pt denies needs at this time)  Activity Tolerance: Patient tolerated treatment well Patient left: in chair;with call bell/phone within reach;with family/visitor present  OT Visit Diagnosis: Unsteadiness on feet (R26.81)  Time: [id number] OT Time Calculation (min): 12 min Charges:  OT General Charges $OT Visit: 1 Visit OT Evaluation $OT Eval Low Complexity: 1 Low  Caedan Sumler Beth Dixon, OTR/L 07/23/2022, 10:49 AM

## 2022-07-23 NOTE — Progress Notes (Signed)
Pt came back to room 8 from HD. Reinitiated tele. VSS. Call bell within reach.   Lavenia Atlas, RN

## 2022-07-24 ENCOUNTER — Other Ambulatory Visit (HOSPITAL_COMMUNITY): Payer: Self-pay

## 2022-07-24 LAB — CBC
HCT: 32.9 % — ABNORMAL LOW (ref 39.0–52.0)
Hemoglobin: 10.3 g/dL — ABNORMAL LOW (ref 13.0–17.0)
MCH: 32.6 pg (ref 26.0–34.0)
MCHC: 31.3 g/dL (ref 30.0–36.0)
MCV: 104.1 fL — ABNORMAL HIGH (ref 80.0–100.0)
Platelets: 183 10*3/uL (ref 150–400)
RBC: 3.16 MIL/uL — ABNORMAL LOW (ref 4.22–5.81)
RDW: 17.3 % — ABNORMAL HIGH (ref 11.5–15.5)
WBC: 8.7 10*3/uL (ref 4.0–10.5)
nRBC: 0 % (ref 0.0–0.2)

## 2022-07-24 LAB — RENAL FUNCTION PANEL
Albumin: 2.9 g/dL — ABNORMAL LOW (ref 3.5–5.0)
Anion gap: 15 (ref 5–15)
BUN: 31 mg/dL — ABNORMAL HIGH (ref 8–23)
CO2: 26 mmol/L (ref 22–32)
Calcium: 8.8 mg/dL — ABNORMAL LOW (ref 8.9–10.3)
Chloride: 95 mmol/L — ABNORMAL LOW (ref 98–111)
Creatinine, Ser: 6.4 mg/dL — ABNORMAL HIGH (ref 0.61–1.24)
GFR, Estimated: 9 mL/min — ABNORMAL LOW (ref >60)
Glucose, Bld: 89 mg/dL (ref 70–99)
Phosphorus: 4.1 mg/dL (ref 2.5–4.6)
Potassium: 3.5 mmol/L (ref 3.5–5.1)
Sodium: 136 mmol/L (ref 135–145)

## 2022-07-24 MED ORDER — FIDAXOMICIN 200 MG PO TABS
200.0000 mg | ORAL_TABLET | Freq: Two times a day (BID) | ORAL | 0 refills | Status: AC
  Filled 2022-07-24: qty 16, 8d supply, fill #0

## 2022-07-24 NOTE — TOC Transition Note (Signed)
Transition of Care (TOC) - CM/SW Discharge Note Marvetta Gibbons RN, BSN Transitions of Care Unit 4E- RN Case Manager See Treatment Team for direct phone #   Patient Details  Name: Albert Harris MRN: [id number] Date of Birth: May 21, 1956  Transition of Care [facility]) CM/SW Contact:  Dawayne Patricia, RN Phone Number: 07/24/2022, 11:52 AM   Clinical Narrative:    Pt from home w/ wife, has needed DME- RW and wheelchair. ESRD on HD- uses access [place] for transport to HD.  Per PT/OT evals pt is near baseline and declines any HH follow up or needs.   Pt stable for transition home today and will follow up per AVS instructions. Next HD tomorrow.    Final next level of care: Home/Self Care Barriers to Discharge: No Barriers Identified   Patient Goals and CMS Choice   Choice offered to / list presented to : NA  Discharge Placement            Home             Discharge Plan and Services Additional resources added to the After Visit Summary for   In-house Referral: NA Discharge Planning Services: CM Consult Post Acute Care Choice: NA          DME Arranged: N/A DME Agency: NA       HH Arranged: NA HH Agency: NA        Social Determinants of Health (SDOH) Interventions SDOH Screenings   Food Insecurity: Food Insecurity Present (07/23/2022)  Housing: Low Risk  (07/23/2022)  Transportation Needs: Unmet Transportation Needs (07/23/2022)  Utilities: Not At Risk (07/23/2022)  Alcohol Screen: Low Risk  (09/12/2019)  Depression (PHQ2-9): High Risk (05/07/2022)  Financial Resource Strain: Low Risk  (09/12/2019)  Stress: No Stress Concern Present (09/12/2019)  Tobacco Use: Medium Risk (07/21/2022)     Readmission Risk Interventions    07/24/2022   11:52 AM 05/07/2021    2:27 PM 12/19/2020   11:55 AM  Readmission Risk Prevention Plan  Transportation Screening Complete Complete Complete  PCP or Specialist Appt within 3-5 Days   Complete  HRI or [place]   Complete   Social Work Consult for [place] Planning/Counseling   Complete  Palliative Care Screening   Complete  Medication Review Press photographer) Complete Complete Complete  PCP or Specialist appointment within 3-5 days of discharge Complete Complete   HRI or [place] Complete Complete   SW Recovery Care/Counseling Consult Patient refused Patient refused   Palliative Care Screening Not Applicable Not [place] Not Applicable Not Applicable

## 2022-07-24 NOTE — Progress Notes (Signed)
Pt to d/c to home today. Contacted [place] SW to advise clinic of pt's d/c today and that pt should resume care tomorrow.   Melven Sartorius Renal Navigator [phone]

## 2022-07-24 NOTE — TOC Benefit Eligibility Note (Signed)
Patient Teacher, English as a foreign language completed.    The patient is currently admitted and upon discharge could be taking Dificid 200 mg tablets.  The current 15 day co-pay is $0.00.   The patient is insured through [employer] Part D and [place], [place] Patient Advocate Specialist [place] Patient Advocate Team Direct Number: [phone]  Fax: [phone]

## 2022-07-24 NOTE — Progress Notes (Signed)
Discharge instructions provided to patient. All medications, follow up appointments, and discharge instructions discussed. IV out. Monitor off CCMD notified. Discharging home.

## 2022-07-24 NOTE — Discharge Summary (Signed)
[facility] Discharge Summary  Patient name: Albert Harris Medical record number: [id number] Date of birth: 1956/01/20 Age: 67 y.o. Gender: male Date of Admission: 07/21/2022  Date of Discharge: 07/24/2022  Admitting Physician: Lowry Ram, MD  Primary Care Provider: Kerin Perna, NP Consultants: nephrology  Indication for Hospitalization: HF exacerbation  Brief Hospital Course:  Albert Harris is a 67 y.o. male who was admitted with recurrent C. Diff and volume overload secondary to missed HD.  ESRD, CHF, Volume Overload Missed 3 outpatient HD sessions due to diarrhea. Presented with shortness of breath and evidence of volume overload as well as hyperkalemia. Volume status managed with HD while inpatient.  C. Diff Recently admitted last month with C Diff infection. Completed course of Fidaxomicin but symptoms recurred. Noted to be C. Diff positive again on admission (+antigen, -toxin, +PCR). ID recommended repeat course of Fidaxomicin x10 days.  Issues for PCP follow-up: Antibiotic, dialysis compliance Repeat CMP  Discharge Diagnoses/Problem List:  HFrEF (heart failure with reduced ejection fraction) Diarrhea Hypertension  Disposition: home  Discharge Condition: stable  Discharge Exam:  BP 129/83 (BP Location: Left Arm)   Pulse 72   Temp 97.8 F (36.6 C) (Oral)   Resp 17   Ht 5' 8"$  (1.727 m)   Wt 63.4 kg   SpO2 95%   BMI 21.25 kg/m  Physical Exam: General: in no acute distress and well-appearing HEENT: normocephalic and atraumatic Respiratory: non-labored breathing and on RA Extremities: moving all extremities spontaneously Gastrointestinal: non-tender and non-distended Cardiovascular: regular rate  Significant Labs and Imaging:  Recent Labs  Lab 07/23/22 0725 07/24/22 0744  WBC 9.0 8.7  HGB 9.6* 10.3*  HCT 29.2* 32.9*  PLT 186 183   Recent Labs  Lab 07/23/22 0725 07/24/22 0744  NA 135 136  K 3.5 3.5  CL  95* 95*  CO2 27 26  GLUCOSE 88 89  BUN 40* 31*  CREATININE 8.85* 6.40*  CALCIUM 8.6* 8.8*  PHOS 5.3* 4.1  ALBUMIN 2.9* 2.9*   Discharge Medications:  Allergies as of 07/24/2022       Reactions   Lidocaine-prilocaine Rash   Caused red marks up and down arm        Medication List     STOP taking these medications    hydrOXYzine 10 MG tablet Commonly known as: ATARAX       TAKE these medications    albuterol 108 (90 Base) MCG/ACT inhaler Commonly known as: VENTOLIN HFA INHALE 1-2 PUFFS INTO THE LUNGS EVERY 6 (SIX) HOURS AS NEEDED FOR WHEEZING OR SHORTNESS OF BREATH.   amLODipine 10 MG tablet Commonly known as: NORVASC Take 1 tablet (10 mg total) by mouth daily.   calcium acetate 667 MG capsule Commonly known as: PHOSLO Take 2 capsule by mouth three times a day with meals What changed:  how much to take when to take this   Dialyvite/Zinc Tabs Take 1 tablet by mouth daily. What changed: Another medication with the same name was removed. Continue taking this medication, and follow the directions you see here.   Entresto 24-26 MG Generic drug: sacubitril-valsartan Take 1 tablet by mouth 2 (two) times daily.   fidaxomicin 200 MG Tabs tablet Commonly known as: DIFICID Take 1 tablet (200 mg total) by mouth 2 (two) times daily for 8 days.   metoprolol succinate 25 MG 24 hr tablet Commonly known as: TOPROL-XL Take 1 tablet (25 mg total) by mouth daily.      Discharge  Instructions: Please refer to Patient Instructions section of EMR for full details.  Patient was counseled important signs and symptoms that should prompt return to medical care, changes in medications, dietary instructions, activity restrictions, and follow up appointments.   Follow-Up Appointments:  Follow-up Information     Kerin Perna, NP Follow up.   Specialty: Internal Medicine Contact information: [place] [address] [phone]                 Camelia Phenes, MD 07/24/2022, 11:19 AM PGY-1, [place]

## 2022-07-24 NOTE — Discharge Instructions (Addendum)
Dear Albert Harris,  It was a pleasure to take care of you during your stay at  [facility]  where you were treated for your HFrEF (heart failure with reduced ejection fraction) ([place]) due to missed HD sessions and started retreatment for Cdiff infection.   While you were here, you were:  observed and cared for by our nurses and nursing assistants  provided therapy by our physical therapy and occupational therapy staff  treated with medicines / procedures by your doctors  provided resources by our social workers and case managers  Please review the medication list provided to you at discharge and stop, start taking, or continue taking the medications listed there.  You should also follow-up with your primary care doctor, or start seeing one if you don't have one yet. If applicable, here are some scheduled follow-ups for you:  I recommend abstinence from alcohol, tobacco, and other illicit drug use.   If your symptoms recur, worsen, or if you have side effects to your medications, call your outpatient provider, 911, or go to the nearest emergency department.  Take care!  Camelia Phenes, MD [facility] Health Physician 07/24/2022 9:22 AM

## 2022-07-24 NOTE — Progress Notes (Signed)
[name], male   DOB: Mar 30, 1956, 67 y.o.   MRN: [id number] [facility] Progress Note   Assessment/ Plan:   1.  Volume overload: Secondary to missed dialysis/cumulative volume buildup despite losses from diarrhea.  Has improved significantly with hemodialysis/ultrafiltration and respiratory status appears to be back to baseline.  He should be able to go home today. 2.  End-stage renal disease: Routinely on a TTS dialysis schedule and has missed dialysis for the past week because of diarrhea.  Diarrhea frequency has improved significantly after restarting fidaxomicin and I recommended that he adhere to his outpatient dialysis schedule with next treatment tomorrow at [facility]. 3.  Diarrhea: Status post recent treatment with fidaxomicin for C. difficile colitis.  Positive C. difficile PCR testing for which he is on fidaxomicin until 08/01/2022 (to complete 10 days of antibiotic therapy). 4.  Hypertension: Compounded by volume overload and improved with hemodialysis.  He remains on metoprolol and Entresto. 5.  Anemia: Secondary to chronic illness and recently compounded by acute illness/hospitalization.  Continue ESA with dialysis. 6.  Secondary hyperparathyroidism: Calcium and phosphoru levels are at acceptable range.  I will continue him on calcitriol with hemodialysis for PTH control.  Subjective:   Reports shortness of breath is resolved/breathing back to normal and he is able to ambulate around his room without problems.  Diarrhea frequency now down to twice a day.   Objective:   BP 129/83 (BP Location: Left Arm)   Pulse 72   Temp 97.8 F (36.6 C) (Oral)   Resp 17   Ht 5' 8"$  (1.727 m)   Wt 63.4 kg   SpO2 95%   BMI 21.25 kg/m   Physical Exam: Gen: Comfortably sitting up in recliner, watching television CVS: Pulse regular rhythm, normal rate, S1 and S2 normal Resp: Clear to auscultation, no rales/rhonchi Abd: Soft, obese,  nontender, bowel sounds normal Ext: 1+ (chronic) right lower extremity edema, no left lower extremity edema.  Right forearm loop graft.  Labs: BMET Recent Labs  Lab 07/22/22 0023 07/23/22 0725  NA 136 135  K 5.7* 3.5  CL 94* 95*  CO2 21* 27  GLUCOSE 93 88  BUN 94* 40*  CREATININE 15.47* 8.85*  CALCIUM 8.9 8.6*  PHOS  --  5.3*   CBC Recent Labs  Lab 07/22/22 0023 07/23/22 0725 07/24/22 0744  WBC 11.0* 9.0 8.7  NEUTROABS 8.9*  --   --   HGB 9.0* 9.6* 10.3*  HCT 28.8* 29.2* 32.9*  MCV 105.1* 102.5* 104.1*  PLT 183 186 183     Medications:     amLODipine  10 mg Oral Daily   calcitRIOL  0.5 mcg Oral Q T,Th,Sa-HD   calcium acetate  1,334 mg Oral TID WC   Chlorhexidine Gluconate Cloth  6 each Topical Q0600   fidaxomicin  200 mg Oral BID   heparin  5,000 Units Subcutaneous Q8H   metoprolol succinate  25 mg Oral Daily   multivitamin  1 tablet Oral QHS   sacubitril-valsartan  1 tablet Oral BID   sodium chloride flush  3 mL Intravenous Q12H   Elmarie Shiley, MD 07/24/2022, 8:41 AM

## 2022-07-25 ENCOUNTER — Telehealth (HOSPITAL_COMMUNITY): Payer: Self-pay | Admitting: Nephrology

## 2022-07-25 NOTE — Telephone Encounter (Signed)
Transition of care contact from inpatient facility  Date of Discharge: 07/24/22 Date of Contact: 07/25/22 - attempted Method of contact: Phone  Attempted to contact patient to discuss transition of care from inpatient admission. Patient did not answer the phone. There was no ability to leave a message, "voicemail has not been set up." Will attempt to reach him at his outpatient HD unit within the week.  Veneta Penton, PA-C [employer] Pager [phone]

## 2022-07-27 ENCOUNTER — Telehealth: Payer: Self-pay

## 2022-07-27 NOTE — Transitions of Care (Post Inpatient/ED Visit) (Signed)
   07/27/2022  Name: Albert Harris MRN: [id number] DOB: [birth year]  Today's TOC FU Call Status: Today's TOC FU Call Status:: Unsuccessul Call (1st Attempt) Unsuccessful Call (1st Attempt) Date: 07/27/22  Attempted to reach the patient regarding the most recent Inpatient/ED visit.  Follow Up Plan: Additional outreach attempts will be made to reach the patient to complete the Transitions of Care (Post Inpatient/ED visit) call.   Voicemail was not set up [phone].   Signature Eden Lathe, RN

## 2022-07-28 ENCOUNTER — Telehealth: Payer: Self-pay

## 2022-07-28 NOTE — Transitions of Care (Post Inpatient/ED Visit) (Signed)
   07/28/2022  Name: CAMRON ORSAK MRN: [id number] DOB: [birth year]  Today's TOC FU Call Status: Today's TOC FU Call Status:: Unsuccessful Call (2nd Attempt) Unsuccessful Call (1st Attempt) Date: 07/27/22 Unsuccessful Call (2nd Attempt) Date: 07/28/22  Attempted to reach the patient regarding the most recent Inpatient/ED visit. Voicemail was not set up : [phone]  Follow Up Plan: Additional outreach attempts will be made to reach the patient to complete the Transitions of Care (Post Inpatient/ED visit) call.   Signature : Eden Lathe, RN

## 2022-07-29 ENCOUNTER — Telehealth: Payer: Self-pay

## 2022-07-29 NOTE — Transitions of Care (Post Inpatient/ED Visit) (Signed)
   07/29/2022  Name: Albert Harris MRN: [id number] DOB: [birth year]  Today's TOC FU Call Status: Today's TOC FU Call Status:: Unsuccessful Call (3rd Attempt) Unsuccessful Call (1st Attempt) Date: 07/27/22 Unsuccessful Call (2nd Attempt) Date: 07/28/22 Unsuccessful Call (3rd Attempt) Date: 07/29/22  Attempted to reach the patient regarding the most recent Inpatient/ED visit.  Follow Up Plan: No further outreach attempts will be made at this time. We have been unable to contact the patient. Letter sent to patient requesting he contact [facility] to schedule a follow up appointment as we have not been able to reach him   Signature Eden Lathe, RN

## 2022-08-03 ENCOUNTER — Other Ambulatory Visit: Payer: Self-pay | Admitting: *Deleted

## 2022-08-03 DIAGNOSIS — I739 Peripheral vascular disease, unspecified: Secondary | ICD-10-CM

## 2022-08-03 DIAGNOSIS — I70235 Atherosclerosis of native arteries of right leg with ulceration of other part of foot: Secondary | ICD-10-CM

## 2022-08-04 ENCOUNTER — Ambulatory Visit: Payer: Self-pay

## 2022-08-04 NOTE — Telephone Encounter (Signed)
     Chief Complaint: Top right tooth. Asking for medication and dental referral. Declines OV, no transportation. Symptoms: ain Frequency: Last night Pertinent Negatives: Patient denies fever Disposition: []$ ED /[]$ Urgent Care (no appt availability in office) / []$ Appointment(In office/virtual)/ []$  [place] [facility]/ []$ Home Care/ []$ Refused Recommended Disposition /[]$ [place] [facility]/ [x]$  Follow-up with PCP Additional Notes: Please advise pt.  Answer Assessment - Initial Assessment Questions 1. LOCATION: "Which tooth is hurting?"  (e.g., right-side/left-side, upper/lower, front/back)     Top, right 2. ONSET: "When did the toothache start?"  (e.g., hours, days)      Last night 3. SEVERITY: "How bad is the toothache?"  (Scale 1-10; mild, moderate or severe)   - MILD (1-3): doesn't interfere with chewing    - MODERATE (4-7): interferes with chewing, interferes with normal activities, awakens from sleep     - SEVERE (8-10): unable to eat, unable to do any normal activities, excruciating pain        10 4. SWELLING: "Is there any visible swelling of your face?"     Swelling 5. OTHER SYMPTOMS: "Do you have any other symptoms?" (e.g., fever)     No 6. PREGNANCY: "Is there any chance you are pregnant?" "When was your last menstrual period?"     N/a  Protocols used: Toothache-A-AH

## 2022-08-05 ENCOUNTER — Ambulatory Visit: Payer: Medicare Other | Admitting: Orthopedic Surgery

## 2022-08-06 ENCOUNTER — Other Ambulatory Visit: Payer: Self-pay

## 2022-08-06 ENCOUNTER — Encounter: Payer: Self-pay | Admitting: Vascular Surgery

## 2022-08-06 ENCOUNTER — Ambulatory Visit (INDEPENDENT_AMBULATORY_CARE_PROVIDER_SITE_OTHER)
Admission: RE | Admit: 2022-08-06 | Discharge: 2022-08-06 | Disposition: A | Discharge status: Home / Self Care | Payer: Medicare Other | Source: Ambulatory Visit | Attending: Vascular Surgery | Admitting: Vascular Surgery

## 2022-08-06 ENCOUNTER — Ambulatory Visit (INDEPENDENT_AMBULATORY_CARE_PROVIDER_SITE_OTHER): Payer: Medicare Other | Admitting: Vascular Surgery

## 2022-08-06 ENCOUNTER — Ambulatory Visit (HOSPITAL_COMMUNITY)
Admission: RE | Admit: 2022-08-06 | Discharge: 2022-08-06 | Disposition: A | Discharge status: Home / Self Care | Payer: Medicare Other | Source: Ambulatory Visit | Attending: Vascular Surgery | Admitting: Vascular Surgery

## 2022-08-06 VITALS — BP 173/70 | HR 73 | Temp 97.9°F | Resp 20 | Ht 68.0 in | Wt 166.3 lb

## 2022-08-06 DIAGNOSIS — I739 Peripheral vascular disease, unspecified: Secondary | ICD-10-CM | POA: Insufficient documentation

## 2022-08-06 DIAGNOSIS — I70235 Atherosclerosis of native arteries of right leg with ulceration of other part of foot: Secondary | ICD-10-CM | POA: Diagnosis not present

## 2022-08-06 DIAGNOSIS — Z48812 Encounter for surgical aftercare following surgery on the circulatory system: Secondary | ICD-10-CM

## 2022-08-06 DIAGNOSIS — I70229 Atherosclerosis of native arteries of extremities with rest pain, unspecified extremity: Secondary | ICD-10-CM

## 2022-08-06 LAB — VAS US ABI WITH/WO TBI
Left ABI: 0.37
Right ABI: 1.4

## 2022-08-06 MED ORDER — SODIUM CHLORIDE 0.9 % IV SOLN: 250.0000 mL | INTRAVENOUS | Status: DC | PRN

## 2022-08-06 MED ORDER — SODIUM CHLORIDE 0.9% FLUSH: 3.0000 mL | Freq: Two times a day (BID) | INTRAVENOUS | Status: DC

## 2022-08-06 NOTE — Progress Notes (Addendum)
REASON FOR VISIT:   Follow-up of peripheral arterial disease.    MEDICAL ISSUES:   PERIPHERAL ARTERIAL DISEASE: He is undergone right iliofemoral endarterectomy and a right femoropopliteal bypass graft.  This is patent and he has normal circulation on the right.  However he has multilevel arterial occlusive disease on the left.  I cannot palpate a femoral pulse.  He has dampened monophasic signals in the left foot with rest pain.  I have recommended that we proceed with arteriography.  If possible we will try to stick the right groin above the bypass graft.  If this is not possible he could potentially require a brachial stick.  I have reviewed with the patient the indications for arteriography. In addition, I have reviewed the potential complications of arteriography including but not limited to: Bleeding, arterial injury, arterial thrombosis, dye action, renal insufficiency, or other unpredictable medical problems. I have explained to the patient that if we find disease amenable to angioplasty we could potentially address this at the same time. I have discussed the potential complications of angioplasty and stenting, including but not limited to: Bleeding, arterial thrombosis, arterial injury, dissection, or the need for surgical intervention.  He dialyzes on Tuesdays Thursdays and Saturdays so I have scheduled this for Monday, 08/10/2022.  Of note, he has not been taking aspirin and I have again reinforced with him the importance of beginning 81 mg of aspirin daily.  Likewise he is not taking a statin.  He simply does not want to take these medications.  However he is agreeable to let us consider starting this when he comes in for his arteriogram.  We can send the prescription to his pharmacy.  LYMPHEDEMA: He has significant lymphedema of the right lower extremity.  He previously had a lymphocele excised and placement of a VAC.  We discussed importance of leg elevation.  END-STAGE RENAL DISEASE:  He has a functioning right forearm AV graft.  He dialyzes on Tuesdays Thursdays and Saturdays.  HPI:   Albert Harris is a pleasant 67 y.o. male who I last saw on 10/16/2021.  He had undergone a previous right iliofemoral endarterectomy and right femoral to below-knee popliteal artery bypass with a vein graft.  He underwent evacuation of a lymphocele on 09/21/2021 and placement of a VAC.  When I saw him last his incision had almost completely healed.  His bypass graft was patent.  I set him up for a 75-monthfollow-up visit with ABIs and a graft duplex.  Since I saw him last, the patient has developed significant rest pain in the left foot.  He has claudication at a very short distance on the left also.  His only issue on the right is leg swelling.  He had a lymphocele evacuated and has significant lymphedema on the right.  He tells me that he has been elevating his leg but it looks like he needs to more aggressively elevate his leg. He does not have any wounds on either leg.  He quit smoking in 2021.  He has multiple issues which could also be contributing to his leg swelling including chronic diastolic congestive heart failure, cirrhosis, and end-stage renal disease.   Past Medical History:  Diagnosis Date   Acute metabolic encephalopathy [id number]  Acute on chronic diastolic CHF (congestive heart failure) ([place] 12/15/2020   Acute respiratory disease due to COVID-19 virus 06/16/2020   Alcohol use disorder 06/30/2022   Anemia    Asthma    Bacteremia due to  Gram-positive bacteria 05/09/2021   Carotid stenosis 09/12/2019   CHF (congestive heart failure) (HCC)    Claudication in peripheral vascular disease ([place]) 123456   Complication of anesthesia    " i HAVE A HARD TIME WAKING UP " (only one time)   COPD (chronic obstructive pulmonary disease) ([place])    COVID-19    positive on  06/16/20   Critical limb ischemia of right lower extremity (HCC)    ESRD on hemodialysis ([place])    t, th. sat  dialysis   GERD (gastroesophageal reflux disease)    HLD (hyperlipidemia)    Hypertension    Hypertensive urgency 12/24/2016   Lower GI bleed    Peripheral vascular disease (HCC)    PFO with atrial septal aneurysm    by echo 11/2017   Pneumonia    Stroke [facility])    Surgical wound infection 09/20/2021   [name] (transient ischemic attack) 11/2017   Tobacco abuse    quit 07/17/19   Volume overload 12/14/2020    Family History  Problem Relation Age of Onset   Hypertension Mother    Colon cancer Father    Stomach cancer Brother     SOCIAL HISTORY: Social History   Tobacco Use   Smoking status: Former    Packs/day: 1.00    Years: 50.00    Total pack years: 50.00    Types: Cigarettes    Quit date: 07/17/2019    Years since quitting: 3.0   Smokeless tobacco: Never  Substance Use Topics   Alcohol use: Not Currently    Alcohol/week: 1.0 - 3.0 standard drink of alcohol    Types: 1 - 3 Cans of beer per week    Allergies  Allergen Reactions   Lidocaine-Prilocaine Rash    Caused red marks up and down arm    Current Outpatient Medications  Medication Sig Dispense Refill   amLODipine (NORVASC) 10 MG tablet Take 1 tablet (10 mg total) by mouth daily. 90 tablet 1   B Complex-C-Zn-Folic Acid (DIALYVITE/ZINC) TABS Take 1 tablet by mouth daily.     calcium acetate (PHOSLO) 667 MG capsule Take 2 capsule by mouth three times a day with meals (Patient taking differently: Take 1,334 mg by mouth 3 (three) times daily with meals.) 540 capsule 3   metoprolol succinate (TOPROL-XL) 25 MG 24 hr tablet Take 1 tablet (25 mg total) by mouth daily. 30 tablet 0   sacubitril-valsartan (ENTRESTO) 24-26 MG Take 1 tablet by mouth 2 (two) times daily. 60 tablet 0   albuterol (VENTOLIN HFA) 108 (90 Base) MCG/ACT inhaler INHALE 1-2 PUFFS INTO THE LUNGS EVERY 6 (SIX) HOURS AS NEEDED FOR WHEEZING OR SHORTNESS OF BREATH. 8 g 0   No current facility-administered medications for this visit.    REVIEW OF SYSTEMS:   '[X]'$  denotes positive finding, '[ ]'$  denotes negative finding Cardiac  Comments:  Chest pain or chest pressure:    Shortness of breath upon exertion:    Short of breath when lying flat:    Irregular heart rhythm:        Vascular    Pain in calf, thigh, or hip brought on by ambulation:    Pain in feet at night that wakes you up from your sleep:     Blood clot in your veins:    Leg swelling:  x       Pulmonary    Oxygen at home:    Productive cough:     Wheezing:  Neurologic    Sudden weakness in arms or legs:     Sudden numbness in arms or legs:     Sudden onset of difficulty speaking or slurred speech:    Temporary loss of vision in one eye:     Problems with dizziness:         Gastrointestinal    Blood in stool:     Vomited blood:         Genitourinary    Burning when urinating:     Blood in urine:        Psychiatric    Major depression:         Hematologic    Bleeding problems:    Problems with blood clotting too easily:        Skin    Rashes or ulcers:        Constitutional    Fever or chills:     PHYSICAL EXAM:   Vitals:   08/06/22 0846  BP: (!) 173/70  Pulse: 73  Resp: 20  Temp: 97.9 F (36.6 C)  SpO2: 98%  Weight: 166 lb 4.8 oz (75.4 kg)  Height: '5\' 8"'$  (1.727 m)    GENERAL: The patient is a well-nourished male, in no acute distress. The vital signs are documented above. CARDIAC: There is a regular rate and rhythm.  VASCULAR: I do not detect carotid bruits. He has a normal right femoral pulse. He has significant swelling of the right lower extremity up to the groin. I cannot palpate a left femoral pulse.  I cannot palpate pedal pulses on the left.  He has dampened monophasic Doppler signals in the left foot. PULMONARY: There is good air exchange bilaterally without wheezing or rales. ABDOMEN: Soft and non-tender with normal pitched bowel sounds.  MUSCULOSKELETAL: There are no major deformities or cyanosis. NEUROLOGIC: No focal weakness  or paresthesias are detected. SKIN: There are no ulcers or rashes noted. PSYCHIATRIC: The patient has a normal affect.  DATA:    ARTERIAL DOPPLER STUDY: I have independently interpreted his arterial Doppler study today.  On the right side there is a biphasic posterior tibial and dorsalis pedis signal.  ABI is greater than 100%.  Toe pressures 101 mmHg.  On the left side there is a dampened monophasic posterior tibial signal and dorsalis pedis signal.  ABI is 69% which may be falsely elevated.  Toe pressures 53 mmHg.  GRAFT DUPLEX: I have independently interpreted his graft duplex scan today.  His right femoropopliteal bypass graft is patent with biphasic flow throughout and no areas of significant stenosis noted.  [name] [facility] [phone]

## 2022-08-06 NOTE — H&P (View-Only) (Signed)
REASON FOR VISIT:   Follow-up of peripheral arterial disease.    MEDICAL ISSUES:   PERIPHERAL ARTERIAL DISEASE: He is undergone right iliofemoral endarterectomy and a right femoropopliteal bypass graft.  This is patent and he has normal circulation on the right.  However he has multilevel arterial occlusive disease on the left.  I cannot palpate a femoral pulse.  He has dampened monophasic signals in the left foot with rest pain.  I have recommended that we proceed with arteriography.  If possible we will try to stick the right groin above the bypass graft.  If this is not possible he could potentially require a brachial stick.  I have reviewed with the patient the indications for arteriography. In addition, I have reviewed the potential complications of arteriography including but not limited to: Bleeding, arterial injury, arterial thrombosis, dye action, renal insufficiency, or other unpredictable medical problems. I have explained to the patient that if we find disease amenable to angioplasty we could potentially address this at the same time. I have discussed the potential complications of angioplasty and stenting, including but not limited to: Bleeding, arterial thrombosis, arterial injury, dissection, or the need for surgical intervention.  He dialyzes on Tuesdays Thursdays and Saturdays so I have scheduled this for Monday, 08/10/2022.  Of note, he has not been taking aspirin and I have again reinforced with him the importance of beginning 81 mg of aspirin daily.  Likewise he is not taking a statin.  He simply does not want to take these medications.  However he is agreeable to let us consider starting this when he comes in for his arteriogram.  We can send the prescription to his pharmacy.  LYMPHEDEMA: He has significant lymphedema of the right lower extremity.  He previously had a lymphocele excised and placement of a VAC.  We discussed importance of leg elevation.  END-STAGE RENAL DISEASE:  He has a functioning right forearm AV graft.  He dialyzes on Tuesdays Thursdays and Saturdays.  HPI:   Albert Harris is a pleasant 67 y.o. male who I last saw on 10/16/2021.  He had undergone a previous right iliofemoral endarterectomy and right femoral to below-knee popliteal artery bypass with a vein graft.  He underwent evacuation of a lymphocele on 09/21/2021 and placement of a VAC.  When I saw him last his incision had almost completely healed.  His bypass graft was patent.  I set him up for a 44-monthfollow-up visit with ABIs and a graft duplex.  Since I saw him last, the patient has developed significant rest pain in the left foot.  He has claudication at a very short distance on the left also.  His only issue on the right is leg swelling.  He had a lymphocele evacuated and has significant lymphedema on the right.  He tells me that he has been elevating his leg but it looks like he needs to more aggressively elevate his leg. He does not have any wounds on either leg.  He quit smoking in 2021.  He has multiple issues which could also be contributing to his leg swelling including chronic diastolic congestive heart failure, cirrhosis, and end-stage renal disease.   Past Medical History:  Diagnosis Date   Acute metabolic encephalopathy [id number]  Acute on chronic diastolic CHF ([place]estive heart [place]15[place]   Acute respiratory disease due to COVID-19 virus 06/16/2020   Alcohol use disorder 06/30/2022   Anemia    Asthma    Bacteremia due to  Gram-positive bacteria 05/09/2021   Carotid stenosis 09/12/2019   CHF (congestive heart failure) (HCC)    Claudication in peripheral vascular disease ([place]) 123456   Complication of anesthesia    " i HAVE A HARD TIME WAKING UP " (only one time)   COPD (chronic obstructive pulmonary disease) ([place])    COVID-19    positive on  06/16/20   Critical limb ischemia of right lower extremity (HCC)    ESRD on hemodialysis ([place])    t, th. sat  dialysis   GERD (gastroesophageal reflux disease)    HLD (hyperlipidemia)    Hypertension    Hypertensive urgency 12/24/2016   Lower GI bleed    Peripheral vascular disease (HCC)    PFO with atrial septal aneurysm    by echo 11/2017   Pneumonia    Stroke [facility])    Surgical wound infection 09/20/2021   [name] (transient ischemic attack) 11/2017   Tobacco abuse    quit 07/17/19   Volume overload 12/14/2020    Family History  Problem Relation Age of Onset   Hypertension Mother    Colon cancer Father    Stomach cancer Brother     SOCIAL HISTORY: Social History   Tobacco Use   Smoking status: Former    Packs/day: 1.00    Years: 50.00    Total pack years: 50.00    Types: Cigarettes    Quit date: 07/17/2019    Years since quitting: 3.0   Smokeless tobacco: Never  Substance Use Topics   Alcohol use: Not Currently    Alcohol/week: 1.0 - 3.0 standard drink of alcohol    Types: 1 - 3 Cans of beer per week    Allergies  Allergen Reactions   Lidocaine-Prilocaine Rash    Caused red marks up and down arm    Current Outpatient Medications  Medication Sig Dispense Refill   amLODipine (NORVASC) 10 MG tablet Take 1 tablet (10 mg total) by mouth daily. 90 tablet 1   B Complex-C-Zn-Folic Acid (DIALYVITE/ZINC) TABS Take 1 tablet by mouth daily.     calcium acetate (PHOSLO) 667 MG capsule Take 2 capsule by mouth three times a day with meals (Patient taking differently: Take 1,334 mg by mouth 3 (three) times daily with meals.) 540 capsule 3   metoprolol succinate (TOPROL-XL) 25 MG 24 hr tablet Take 1 tablet (25 mg total) by mouth daily. 30 tablet 0   sacubitril-valsartan (ENTRESTO) 24-26 MG Take 1 tablet by mouth 2 (two) times daily. 60 tablet 0   albuterol (VENTOLIN HFA) 108 (90 Base) MCG/ACT inhaler INHALE 1-2 PUFFS INTO THE LUNGS EVERY 6 (SIX) HOURS AS NEEDED FOR WHEEZING OR SHORTNESS OF BREATH. 8 g 0   No current facility-administered medications for this visit.    REVIEW OF SYSTEMS:   '[X]'$  denotes positive finding, '[ ]'$  denotes negative finding Cardiac  Comments:  Chest pain or chest pressure:    Shortness of breath upon exertion:    Short of breath when lying flat:    Irregular heart rhythm:        Vascular    Pain in calf, thigh, or hip brought on by ambulation:    Pain in feet at night that wakes you up from your sleep:     Blood clot in your veins:    Leg swelling:  x       Pulmonary    Oxygen at home:    Productive cough:     Wheezing:  Neurologic    Sudden weakness in arms or legs:     Sudden numbness in arms or legs:     Sudden onset of difficulty speaking or slurred speech:    Temporary loss of vision in one eye:     Problems with dizziness:         Gastrointestinal    Blood in stool:     Vomited blood:         Genitourinary    Burning when urinating:     Blood in urine:        Psychiatric    Major depression:         Hematologic    Bleeding problems:    Problems with blood clotting too easily:        Skin    Rashes or ulcers:        Constitutional    Fever or chills:     PHYSICAL EXAM:   Vitals:   08/06/22 0846  BP: (!) 173/70  Pulse: 73  Resp: 20  Temp: 97.9 F (36.6 C)  SpO2: 98%  Weight: 166 lb 4.8 oz (75.4 kg)  Height: '5\' 8"'$  (1.727 m)    GENERAL: The patient is a well-nourished male, in no acute distress. The vital signs are documented above. CARDIAC: There is a regular rate and rhythm.  VASCULAR: I do not detect carotid bruits. He has a normal right femoral pulse. He has significant swelling of the right lower extremity up to the groin. I cannot palpate a left femoral pulse.  I cannot palpate pedal pulses on the left.  He has dampened monophasic Doppler signals in the left foot. PULMONARY: There is good air exchange bilaterally without wheezing or rales. ABDOMEN: Soft and non-tender with normal pitched bowel sounds.  MUSCULOSKELETAL: There are no major deformities or cyanosis. NEUROLOGIC: No focal weakness  or paresthesias are detected. SKIN: There are no ulcers or rashes noted. PSYCHIATRIC: The patient has a normal affect.  DATA:    ARTERIAL DOPPLER STUDY: I have independently interpreted his arterial Doppler study today.  On the right side there is a biphasic posterior tibial and dorsalis pedis signal.  ABI is greater than 100%.  Toe pressures 101 mmHg.  On the left side there is a dampened monophasic posterior tibial signal and dorsalis pedis signal.  ABI is 69% which may be falsely elevated.  Toe pressures 53 mmHg.  GRAFT DUPLEX: I have independently interpreted his graft duplex scan today.  His right femoropopliteal bypass graft is patent with biphasic flow throughout and no areas of significant stenosis noted.  [name] [facility] [phone]

## 2022-08-10 ENCOUNTER — Ambulatory Visit (HOSPITAL_BASED_OUTPATIENT_CLINIC_OR_DEPARTMENT_OTHER)
Admission: RE | Admit: 2022-08-10 | Discharge: 2022-08-10 | Disposition: A | Discharge status: Home / Self Care | Payer: Medicare Other | Source: Home / Self Care | Attending: Vascular Surgery | Admitting: Vascular Surgery

## 2022-08-10 ENCOUNTER — Other Ambulatory Visit: Payer: Self-pay

## 2022-08-10 ENCOUNTER — Inpatient Hospital Stay (HOSPITAL_COMMUNITY)
Admission: RE | Disposition: A | Discharge status: Home / Self Care | Payer: Self-pay | Source: Home / Self Care | Attending: Vascular Surgery

## 2022-08-10 DIAGNOSIS — A0811 Acute gastroenteropathy due to Norwalk agent: Secondary | ICD-10-CM | POA: Diagnosis not present

## 2022-08-10 DIAGNOSIS — I708 Atherosclerosis of other arteries: Secondary | ICD-10-CM | POA: Insufficient documentation

## 2022-08-10 DIAGNOSIS — I5032 Chronic diastolic (congestive) heart failure: Secondary | ICD-10-CM | POA: Insufficient documentation

## 2022-08-10 DIAGNOSIS — N186 End stage renal disease: Secondary | ICD-10-CM | POA: Insufficient documentation

## 2022-08-10 DIAGNOSIS — I890 Lymphedema, not elsewhere classified: Secondary | ICD-10-CM | POA: Insufficient documentation

## 2022-08-10 DIAGNOSIS — K7460 Unspecified cirrhosis of liver: Secondary | ICD-10-CM | POA: Insufficient documentation

## 2022-08-10 DIAGNOSIS — I70222 Atherosclerosis of native arteries of extremities with rest pain, left leg: Secondary | ICD-10-CM | POA: Insufficient documentation

## 2022-08-10 DIAGNOSIS — I132 Hypertensive heart and chronic kidney disease with heart failure and with stage 5 chronic kidney disease, or end stage renal disease: Secondary | ICD-10-CM | POA: Insufficient documentation

## 2022-08-10 DIAGNOSIS — Z87891 Personal history of nicotine dependence: Secondary | ICD-10-CM | POA: Insufficient documentation

## 2022-08-10 DIAGNOSIS — I70229 Atherosclerosis of native arteries of extremities with rest pain, unspecified extremity: Secondary | ICD-10-CM

## 2022-08-10 HISTORY — PX: PERIPHERAL VASCULAR INTERVENTION: CATH118257

## 2022-08-10 HISTORY — PX: ABDOMINAL AORTOGRAM W/LOWER EXTREMITY: CATH118223

## 2022-08-10 LAB — POCT I-STAT, CHEM 8
BUN: 28 mg/dL — ABNORMAL HIGH (ref 8–23)
Calcium, Ion: 1.19 mmol/L (ref 1.15–1.40)
Chloride: 100 mmol/L (ref 98–111)
Creatinine, Ser: 7.2 mg/dL — ABNORMAL HIGH (ref 0.61–1.24)
Glucose, Bld: 78 mg/dL (ref 70–99)
HCT: 31 % — ABNORMAL LOW (ref 39.0–52.0)
Hemoglobin: 10.5 g/dL — ABNORMAL LOW (ref 13.0–17.0)
Potassium: 4.3 mmol/L (ref 3.5–5.1)
Sodium: 142 mmol/L (ref 135–145)
TCO2: 33 mmol/L — ABNORMAL HIGH (ref 22–32)

## 2022-08-10 LAB — POCT ACTIVATED CLOTTING TIME
Activated Clotting Time: 131 seconds
Activated Clotting Time: 168 seconds
Activated Clotting Time: 196 seconds
Activated Clotting Time: 244 seconds
Activated Clotting Time: 234 seconds
Activated Clotting Time: 234 seconds
Activated Clotting Time: 206 seconds
Activated Clotting Time: 163 seconds

## 2022-08-10 SURGERY — ABDOMINAL AORTOGRAM W/LOWER EXTREMITY
Anesthesia: LOCAL

## 2022-08-10 MED ORDER — HEPARIN SODIUM (PORCINE) 1000 UNIT/ML IJ SOLN
INTRAMUSCULAR | Status: DC | PRN
  Administered 2022-08-10: 2000 [IU] via INTRAVENOUS
  Administered 2022-08-10: 7500 [IU] via INTRAVENOUS
  Administered 2022-08-10: 3000 [IU] via INTRAVENOUS

## 2022-08-10 MED ORDER — SODIUM CHLORIDE 0.9% FLUSH: 3.0000 mL | INTRAVENOUS | Status: DC | PRN

## 2022-08-10 MED ORDER — LABETALOL HCL 5 MG/ML IV SOLN
INTRAVENOUS | Status: AC
  Filled 2022-08-10: qty 4

## 2022-08-10 MED ORDER — ASPIRIN 81 MG PO CHEW
CHEWABLE_TABLET | ORAL | Status: AC
  Filled 2022-08-10: qty 1

## 2022-08-10 MED ORDER — ONDANSETRON HCL 4 MG/2ML IJ SOLN: 4.0000 mg | Freq: Four times a day (QID) | INTRAMUSCULAR | Status: DC | PRN

## 2022-08-10 MED ORDER — ACETAMINOPHEN 325 MG PO TABS: 650.0000 mg | ORAL_TABLET | ORAL | Status: DC | PRN

## 2022-08-10 MED ORDER — ROSUVASTATIN CALCIUM 10 MG PO TABS: 10.0000 mg | ORAL_TABLET | Freq: Every day | ORAL | 11 refills | Status: DC

## 2022-08-10 MED ORDER — ASPIRIN 81 MG PO TBEC: 81.0000 mg | DELAYED_RELEASE_TABLET | Freq: Every day | ORAL | Status: DC

## 2022-08-10 MED ORDER — PROTAMINE SULFATE 10 MG/ML IV SOLN
INTRAVENOUS | Status: AC
  Filled 2022-08-10: qty 5

## 2022-08-10 MED ORDER — LABETALOL HCL 5 MG/ML IV SOLN
INTRAVENOUS | Status: DC | PRN
  Administered 2022-08-10 (×2): 10 mg via INTRAVENOUS

## 2022-08-10 MED ORDER — HEPARIN SODIUM (PORCINE) 1000 UNIT/ML IJ SOLN
INTRAMUSCULAR | Status: AC
  Filled 2022-08-10: qty 20

## 2022-08-10 MED ORDER — ROSUVASTATIN CALCIUM 10 MG PO TABS: 10.0000 mg | ORAL_TABLET | Freq: Every day | ORAL | Status: DC

## 2022-08-10 MED ORDER — CLOPIDOGREL BISULFATE 75 MG PO TABS: 75.0000 mg | ORAL_TABLET | Freq: Every day | ORAL | Status: DC

## 2022-08-10 MED ORDER — ASPIRIN 81 MG PO TBEC: 81.0000 mg | DELAYED_RELEASE_TABLET | Freq: Every day | ORAL | 1 refills | Status: DC

## 2022-08-10 MED ORDER — HEPARIN (PORCINE) IN NACL 1000-0.9 UT/500ML-% IV SOLN
INTRAVENOUS | Status: DC | PRN
  Administered 2022-08-10 (×2): 500 mL

## 2022-08-10 MED ORDER — FENTANYL CITRATE (PF) 100 MCG/2ML IJ SOLN
INTRAMUSCULAR | Status: AC
  Filled 2022-08-10: qty 2

## 2022-08-10 MED ORDER — MIDAZOLAM HCL 2 MG/2ML IJ SOLN
INTRAMUSCULAR | Status: DC | PRN
  Administered 2022-08-10: 1 mg via INTRAVENOUS

## 2022-08-10 MED ORDER — HYDRALAZINE HCL 20 MG/ML IJ SOLN: 5.0000 mg | INTRAMUSCULAR | Status: DC | PRN

## 2022-08-10 MED ORDER — MIDAZOLAM HCL 2 MG/2ML IJ SOLN
INTRAMUSCULAR | Status: AC
  Filled 2022-08-10: qty 2

## 2022-08-10 MED ORDER — LIDOCAINE HCL (PF) 1 % IJ SOLN
INTRAMUSCULAR | Status: AC
  Filled 2022-08-10: qty 30

## 2022-08-10 MED ORDER — SODIUM CHLORIDE 0.9% FLUSH: 3.0000 mL | Freq: Two times a day (BID) | INTRAVENOUS | Status: DC

## 2022-08-10 MED ORDER — ASPIRIN 81 MG PO CHEW
CHEWABLE_TABLET | ORAL | Status: DC | PRN
  Administered 2022-08-10: 81 mg via ORAL

## 2022-08-10 MED ORDER — LABETALOL HCL 5 MG/ML IV SOLN: 10.0000 mg | INTRAVENOUS | Status: DC | PRN

## 2022-08-10 MED ORDER — CLOPIDOGREL BISULFATE 75 MG PO TABS: 75.0000 mg | ORAL_TABLET | Freq: Every day | ORAL | 11 refills | Status: DC

## 2022-08-10 MED ORDER — TETRACAINE HCL 1 % IJ SOLN
20.0000 mg | Freq: Once | INTRAMUSCULAR | Status: AC
  Administered 2022-08-10: 2 mL via INTRADERMAL
  Filled 2022-08-10: qty 2

## 2022-08-10 MED ORDER — PROTAMINE SULFATE 10 MG/ML IV SOLN
25.0000 mg | Freq: Once | INTRAVENOUS | Status: AC
  Administered 2022-08-10: 25 mg via INTRAVENOUS

## 2022-08-10 MED ORDER — FENTANYL CITRATE (PF) 100 MCG/2ML IJ SOLN
INTRAMUSCULAR | Status: DC | PRN
  Administered 2022-08-10: 25 ug via INTRAVENOUS
  Administered 2022-08-10: 50 ug via INTRAVENOUS

## 2022-08-10 MED ORDER — SODIUM CHLORIDE 0.9 % IV SOLN: 250.0000 mL | INTRAVENOUS | Status: DC | PRN

## 2022-08-10 MED ORDER — CLOPIDOGREL BISULFATE 300 MG PO TABS
ORAL_TABLET | ORAL | Status: DC | PRN
  Administered 2022-08-10: 300 mg via ORAL

## 2022-08-10 MED ORDER — IODIXANOL 320 MG/ML IV SOLN
INTRAVENOUS | Status: DC | PRN
  Administered 2022-08-10: 125 mL

## 2022-08-10 MED ORDER — CLOPIDOGREL BISULFATE 300 MG PO TABS
ORAL_TABLET | ORAL | Status: AC
  Filled 2022-08-10: qty 1

## 2022-08-10 SURGICAL SUPPLY — 25 items
BALLN MUSTANG 5.0X40 75 (BALLOONS) ×2
BALLN MUSTANG 6.0X40 75 (BALLOONS) ×2
BALLOON MUSTANG 5.0X40 75 (BALLOONS) IMPLANT
BALLOON MUSTANG 6.0X40 75 (BALLOONS) IMPLANT
CATH ANGIO 5F PIGTAIL 65CM (CATHETERS) IMPLANT
CATH STRAIGHT 5FR 65CM (CATHETERS) IMPLANT
GLIDEWIRE NITREX 0.018X80X5 (WIRE) ×2
GUIDEWIRE ANGLED .035X150CM (WIRE) IMPLANT
GUIDEWIRE NITREX 0.018X80X5 (WIRE) IMPLANT
KIT ENCORE 26 ADVANTAGE (KITS) IMPLANT
KIT MICROPUNCTURE NIT STIFF (SHEATH) IMPLANT
KIT PV (KITS) ×3 IMPLANT
SHEATH BRITE TIP 7FR 35CM (SHEATH) IMPLANT
SHEATH PINNACLE 5F 10CM (SHEATH) IMPLANT
SHEATH PINNACLE 7F 10CM (SHEATH) IMPLANT
STENT VIABAHN 7X50X120 (Permanent Stent) ×4 IMPLANT
STENT VIABAHN 7X5X120 7FR (Permanent Stent) IMPLANT
STOPCOCK MORSE 400PSI 3WAY (MISCELLANEOUS) IMPLANT
SYR MEDRAD MARK 7 150ML (SYRINGE) ×3 IMPLANT
TRANSDUCER W/STOPCOCK (MISCELLANEOUS) ×3 IMPLANT
TRAY PV CATH (CUSTOM PROCEDURE TRAY) ×3 IMPLANT
TUBING CIL FLEX 10 FLL-RA (TUBING) IMPLANT
WIRE BENTSON .035X145CM (WIRE) IMPLANT
WIRE G V18X300CM (WIRE) IMPLANT
WIRE ROSEN-J .035X180CM (WIRE) IMPLANT

## 2022-08-10 NOTE — Progress Notes (Signed)
Sheath removal at 15:15 hours. Manual pressure held for 45 minutes, Site level 0, no pain upon palpation, no swelling, no redness, no ecchymosis noted. Access site covered with gauze and transparent dressing. Patient instructed to place his hand and apply pressure to the dressing when he coughs, sneeze, or laughs.

## 2022-08-10 NOTE — Op Note (Signed)
PATIENT: Albert Harris      MRN: [id number] DOB: Jun 04, 1956    DATE OF PROCEDURE: 08/10/2022  INDICATIONS:    Albert Harris is a 67 y.o. male who presented with rest pain of his left foot.  He has undergone a previous right iliofemoral endarterectomy and a right femoropopliteal bypass.  He is also had bilateral iliac stents.  PROCEDURE:    Conscious sedation Ultrasound-guided access to the left common femoral artery Aortogram with left lower extremity runoff Angioplasty and stenting of the left external iliac artery (7 mm x 5 cm Viabahn stent x 2; postdilatation with a 6 mm balloon)  SURGEON: Albert Cornfield. Scot Dock, MD, FACS  ANESTHESIA: Local with sedation  EBL: Minimal   TECHNIQUE: The patient was brought to the peripheral vascular lab and was sedated. The period of conscious sedation was 105 minutes.  During that time period, I was present face-to-face 100% of the time.  The patient was administered initially 1 mg of Versed and 50 mcg of fentanyl. [name] heart rate, blood pressure, and oxygen saturation were monitored by the nurse continuously during the procedure.  Both groins were prepped and draped in the usual sterile fashion.  Under ultrasound guidance, after the skin was anesthetized, I cannulated the left common femoral artery with a micropuncture needle and a micropuncture sheath was introduced over a wire.  By ultrasound the femoral artery was patent.  There was significant calcific disease in the common femoral artery.  A real-time image was obtained and sent to the server.This was exchanged for a 5 [place] sheath over a Bentson wire.  It was difficult to pass the wire and ultimately the wire and sheath came out.  I held pressure for 10 minutes.  There was no evidence of hematoma.  I then under ultrasound guidance again accessed the left common femoral artery with a micropuncture needle.  This time I used the Nitrex wire and was able to navigate into the common iliac  artery and advanced the micropuncture sheath over the wire.  I then used an angled Glidewire to get up into the infrarenal aorta.  I exchanged this for a Rosen wire over a straight catheter.  I then placed a 5 French sheath in the left groin.  The pigtail catheter was positioned at the L1 vertebral body and flush aortogram obtained.  The cath was positioned above the aortic bifurcation and an oblique iliac projection was obtained.  The patient had significant disease in the external iliac artery on the left and I elected to address this with angioplasty and stenting.    I attempted to advance a 7 [place] destination sheath over the [facility] wire but met significant resistance.  Therefore the patient was heparinized.  ACT was monitored throughout the procedure.  I dilated the area of concern in the external iliac artery with a 5 mm x 4 cm balloon.  At this point I was able to advance the 7 French sheath.  I then exchanged wires for a 0.018 wire.  I selected a 7 x 50 Viabahn stent.  This was positioned below the takeoff of the hypogastric artery which was occluded.  This was deployed without difficulty and postdilatation was done with a 6 mm balloon.  There was an area of concern the distal stent and I elected to cover this.  A second 7 mm x 50 mm Viabahn stent was overlapped to cover the area of concern and this was deployed without difficulty.  Postdilatation was done  with a 6 mm balloon.  There was an excellent result with no residual stenosis.  Runoff film was then obtained on the left.  At the at the completion we exchanged the long 7 French sheath for a short 7 [place] sheath which was easily advanced into the iliac system on the left at this point.  FINDINGS:   The infrarenal aorta is patent with no significant stenosis identified.  The renal arteries are patent with no significant renal artery stenosis identified. On the right side the common iliac and external iliac arteries are patent. On the left side  the common iliac artery is patent and it has previously been stented.  There was diffuse disease in the external iliac artery with up to an 80% stenosis.  This was successfully addressed with angioplasty and stenting as described above with no residual stenosis. Low that the common femoral had calcific disease but was patent.  The deep femoral artery is patent.  The superficial femoral artery is occluded at its origin with reconstitution of the above-knee popliteal artery.  The patient has diffuse tibial disease.  The dominant runoff is the peroneal artery which occludes above the foot.  The anterior tibial artery has diffuse disease proximally and then occludes in the mid leg.  There is reconstitution of the distal anterior tibial and dorsalis pedis artery on the left.  The posterior tibial artery is occluded and reconstitutes above the ankle with moderate disease.  CLINICAL NOTE: Hopefully by addressing the inflow this will help with his rest pain.  Otherwise we would have to consider infrainguinal bypass.  He has diffuse calcific disease in the common femoral artery and the stent extends very low.  I would like to avoid bypass if possible.  However when he returns for a follow-up visit we will obtain vein mapping.  He has not been taking aspirin or a statin.  He was started on 81 mg of aspirin, 75 mg of Plavix daily, and 10 mg of Crestor daily.  This was sent to his pharmacy.    WIfI 0 1 0 Clinical stage I  [name], MD, FACS [facility]  DATE OF DICTATION:   08/10/2022

## 2022-08-10 NOTE — Progress Notes (Signed)
Patient C/o rash area to left chest that itches and is oozing.  Eliezer Champagne assessed patient and called Dr. Scot Dock to see patient.  Dr. Scot Dock looked at patient and stated we were okay to proceed.

## 2022-08-10 NOTE — Interval H&P Note (Signed)
History and Physical Interval Note:  08/10/2022 7:12 AM  Albert Harris  has presented today for surgery, with the diagnosis of pad with rest pain.  The various methods of treatment have been discussed with the patient and family. After consideration of risks, benefits and other options for treatment, the patient has consented to  Procedure(s): ABDOMINAL AORTOGRAM W/LOWER EXTREMITY (N/A) as a surgical intervention.  [name] history has been reviewed, patient examined, no change in status, stable for surgery.  I have reviewed the patient's chart and labs.  Questions were answered to the patient's satisfaction.     [name]

## 2022-08-10 NOTE — Interval H&P Note (Signed)
History and Physical Interval Note:  08/10/2022 7:59 AM  Albert Harris  has presented today for surgery, with the diagnosis of pad with rest pain.  The various methods of treatment have been discussed with the patient and family. After consideration of risks, benefits and other options for treatment, the patient has consented to  Procedure(s): ABDOMINAL AORTOGRAM W/LOWER EXTREMITY (N/A) as a surgical intervention.  [name] history has been reviewed, patient examined, no change in status, stable for surgery.  I have reviewed the patient's chart and labs.  Questions were answered to the patient's satisfaction.     [name]

## 2022-08-10 NOTE — Progress Notes (Signed)
Serial ACT results are continuing to be greater than 200. DR Scot Dock paged.

## 2022-08-11 ENCOUNTER — Ambulatory Visit: Payer: Medicare Other | Admitting: Orthopedic Surgery

## 2022-08-11 ENCOUNTER — Encounter (HOSPITAL_COMMUNITY): Payer: Self-pay | Admitting: Vascular Surgery

## 2022-08-11 MED FILL — Lidocaine HCl Local Preservative Free (PF) Inj 1%: INTRAMUSCULAR | Qty: 30 | Status: AC

## 2022-08-12 ENCOUNTER — Encounter (HOSPITAL_COMMUNITY): Payer: Self-pay

## 2022-08-12 ENCOUNTER — Emergency Department (HOSPITAL_COMMUNITY): Payer: Medicare Other

## 2022-08-12 ENCOUNTER — Inpatient Hospital Stay (HOSPITAL_COMMUNITY)
Admission: EM | Admit: 2022-08-12 | Discharge: 2022-08-15 | DRG: 356 | Disposition: A | Discharge status: Home / Self Care | Payer: Medicare Other | Attending: Family Medicine | Admitting: Family Medicine

## 2022-08-12 ENCOUNTER — Other Ambulatory Visit: Payer: Self-pay

## 2022-08-12 DIAGNOSIS — K529 Noninfective gastroenteritis and colitis, unspecified: Secondary | ICD-10-CM | POA: Diagnosis present

## 2022-08-12 DIAGNOSIS — K683 Retroperitoneal hematoma: Secondary | ICD-10-CM | POA: Diagnosis present

## 2022-08-12 DIAGNOSIS — D631 Anemia in chronic kidney disease: Secondary | ICD-10-CM | POA: Diagnosis present

## 2022-08-12 DIAGNOSIS — J9601 Acute respiratory failure with hypoxia: Secondary | ICD-10-CM | POA: Diagnosis present

## 2022-08-12 DIAGNOSIS — J4489 Other specified chronic obstructive pulmonary disease: Secondary | ICD-10-CM | POA: Diagnosis present

## 2022-08-12 DIAGNOSIS — N186 End stage renal disease: Secondary | ICD-10-CM | POA: Diagnosis present

## 2022-08-12 DIAGNOSIS — A0811 Acute gastroenteropathy due to Norwalk agent: Secondary | ICD-10-CM | POA: Diagnosis present

## 2022-08-12 DIAGNOSIS — Z8673 Personal history of transient ischemic attack (TIA), and cerebral infarction without residual deficits: Secondary | ICD-10-CM

## 2022-08-12 DIAGNOSIS — Z888 Allergy status to other drugs, medicaments and biological substances status: Secondary | ICD-10-CM

## 2022-08-12 DIAGNOSIS — R112 Nausea with vomiting, unspecified: Secondary | ICD-10-CM

## 2022-08-12 DIAGNOSIS — Z91158 Patient's noncompliance with renal dialysis for other reason: Secondary | ICD-10-CM

## 2022-08-12 DIAGNOSIS — T39016A Underdosing of aspirin, initial encounter: Secondary | ICD-10-CM | POA: Diagnosis present

## 2022-08-12 DIAGNOSIS — Z79899 Other long term (current) drug therapy: Secondary | ICD-10-CM

## 2022-08-12 DIAGNOSIS — I97618 Postprocedural hemorrhage and hematoma of a circulatory system organ or structure following other circulatory system procedure: Secondary | ICD-10-CM | POA: Diagnosis present

## 2022-08-12 DIAGNOSIS — I5042 Chronic combined systolic (congestive) and diastolic (congestive) heart failure: Secondary | ICD-10-CM | POA: Diagnosis present

## 2022-08-12 DIAGNOSIS — I10 Essential (primary) hypertension: Secondary | ICD-10-CM | POA: Diagnosis present

## 2022-08-12 DIAGNOSIS — K7460 Unspecified cirrhosis of liver: Secondary | ICD-10-CM | POA: Diagnosis present

## 2022-08-12 DIAGNOSIS — Z800 Family history of malignant neoplasm of digestive organs: Secondary | ICD-10-CM

## 2022-08-12 DIAGNOSIS — I898 Other specified noninfective disorders of lymphatic vessels and lymph nodes: Secondary | ICD-10-CM | POA: Diagnosis present

## 2022-08-12 DIAGNOSIS — Z992 Dependence on renal dialysis: Secondary | ICD-10-CM

## 2022-08-12 DIAGNOSIS — I708 Atherosclerosis of other arteries: Secondary | ICD-10-CM | POA: Diagnosis present

## 2022-08-12 DIAGNOSIS — E785 Hyperlipidemia, unspecified: Secondary | ICD-10-CM | POA: Diagnosis present

## 2022-08-12 DIAGNOSIS — A09 Infectious gastroenteritis and colitis, unspecified: Principal | ICD-10-CM

## 2022-08-12 DIAGNOSIS — Z91128 Patient's intentional underdosing of medication regimen for other reason: Secondary | ICD-10-CM

## 2022-08-12 DIAGNOSIS — A419 Sepsis, unspecified organism: Secondary | ICD-10-CM

## 2022-08-12 DIAGNOSIS — Z87891 Personal history of nicotine dependence: Secondary | ICD-10-CM

## 2022-08-12 DIAGNOSIS — I70222 Atherosclerosis of native arteries of extremities with rest pain, left leg: Secondary | ICD-10-CM | POA: Diagnosis present

## 2022-08-12 DIAGNOSIS — Q2112 Patent foramen ovale: Secondary | ICD-10-CM

## 2022-08-12 DIAGNOSIS — Y838 Other surgical procedures as the cause of abnormal reaction of the patient, or of later complication, without mention of misadventure at the time of the procedure: Secondary | ICD-10-CM | POA: Diagnosis present

## 2022-08-12 DIAGNOSIS — Z8616 Personal history of COVID-19: Secondary | ICD-10-CM

## 2022-08-12 DIAGNOSIS — E8770 Fluid overload, unspecified: Secondary | ICD-10-CM | POA: Diagnosis present

## 2022-08-12 DIAGNOSIS — I5020 Unspecified systolic (congestive) heart failure: Secondary | ICD-10-CM | POA: Diagnosis present

## 2022-08-12 DIAGNOSIS — Z8619 Personal history of other infectious and parasitic diseases: Secondary | ICD-10-CM

## 2022-08-12 DIAGNOSIS — Z8249 Family history of ischemic heart disease and other diseases of the circulatory system: Secondary | ICD-10-CM

## 2022-08-12 DIAGNOSIS — I890 Lymphedema, not elsewhere classified: Secondary | ICD-10-CM | POA: Diagnosis present

## 2022-08-12 DIAGNOSIS — I132 Hypertensive heart and chronic kidney disease with heart failure and with stage 5 chronic kidney disease, or end stage renal disease: Secondary | ICD-10-CM | POA: Diagnosis present

## 2022-08-12 LAB — COMPREHENSIVE METABOLIC PANEL
ALT: 8 U/L (ref 0–44)
AST: 18 U/L (ref 15–41)
Albumin: 3.5 g/dL (ref 3.5–5.0)
Alkaline Phosphatase: 106 U/L (ref 38–126)
Anion gap: 18 — ABNORMAL HIGH (ref 5–15)
BUN: 43 mg/dL — ABNORMAL HIGH (ref 8–23)
CO2: 26 mmol/L (ref 22–32)
Calcium: 9.3 mg/dL (ref 8.9–10.3)
Chloride: 94 mmol/L — ABNORMAL LOW (ref 98–111)
Creatinine, Ser: 10.75 mg/dL — ABNORMAL HIGH (ref 0.61–1.24)
GFR, Estimated: 5 mL/min — ABNORMAL LOW (ref >60)
Glucose, Bld: 118 mg/dL — ABNORMAL HIGH (ref 70–99)
Potassium: 5 mmol/L (ref 3.5–5.1)
Sodium: 138 mmol/L (ref 135–145)
Total Bilirubin: 1.3 mg/dL — ABNORMAL HIGH (ref 0.3–1.2)
Total Protein: 7.7 g/dL (ref 6.5–8.1)

## 2022-08-12 LAB — C DIFFICILE QUICK SCREEN W PCR REFLEX
C Diff antigen: NEGATIVE
C Diff interpretation: NOT DETECTED
C Diff toxin: NEGATIVE

## 2022-08-12 LAB — CBC WITH DIFFERENTIAL/PLATELET
Abs Immature Granulocytes: 0.09 10*3/uL — ABNORMAL HIGH (ref 0.00–0.07)
Basophils Absolute: 0.1 10*3/uL (ref 0.0–0.1)
Basophils Relative: 0 %
Eosinophils Absolute: 0 10*3/uL (ref 0.0–0.5)
Eosinophils Relative: 0 %
HCT: 26.6 % — ABNORMAL LOW (ref 39.0–52.0)
Hemoglobin: 8.5 g/dL — ABNORMAL LOW (ref 13.0–17.0)
Immature Granulocytes: 1 %
Lymphocytes Relative: 5 %
Lymphs Abs: 0.8 10*3/uL (ref 0.7–4.0)
MCH: 34 pg (ref 26.0–34.0)
MCHC: 32 g/dL (ref 30.0–36.0)
MCV: 106.4 fL — ABNORMAL HIGH (ref 80.0–100.0)
Monocytes Absolute: 1.1 10*3/uL — ABNORMAL HIGH (ref 0.1–1.0)
Monocytes Relative: 6 %
Neutro Abs: 15.1 10*3/uL — ABNORMAL HIGH (ref 1.7–7.7)
Neutrophils Relative %: 88 %
Platelets: 154 10*3/uL (ref 150–400)
RBC: 2.5 MIL/uL — ABNORMAL LOW (ref 4.22–5.81)
RDW: 17.8 % — ABNORMAL HIGH (ref 11.5–15.5)
WBC: 17.1 10*3/uL — ABNORMAL HIGH (ref 4.0–10.5)
nRBC: 0 % (ref 0.0–0.2)

## 2022-08-12 LAB — BRAIN NATRIURETIC PEPTIDE: B Natriuretic Peptide: >4500 pg/mL — ABNORMAL HIGH (ref 0.0–100.0)

## 2022-08-12 LAB — PROTIME-INR
INR: 1.2 (ref 0.8–1.2)
Prothrombin Time: 14.9 seconds (ref 11.4–15.2)

## 2022-08-12 LAB — RESP PANEL BY RT-PCR (RSV, FLU A&B, COVID)  RVPGX2
Influenza A by PCR: NEGATIVE
Influenza B by PCR: NEGATIVE
Resp Syncytial Virus by PCR: NEGATIVE
SARS Coronavirus 2 by RT PCR: NEGATIVE

## 2022-08-12 LAB — POC OCCULT BLOOD, ED: Fecal Occult Bld: NEGATIVE

## 2022-08-12 MED ORDER — METRONIDAZOLE 500 MG/100ML IV SOLN
500.0000 mg | Freq: Two times a day (BID) | INTRAVENOUS | Status: DC
  Administered 2022-08-13 – 2022-08-14 (×3): 500 mg via INTRAVENOUS
  Filled 2022-08-12 (×3): qty 100

## 2022-08-12 MED ORDER — CIPROFLOXACIN IN D5W 400 MG/200ML IV SOLN
400.0000 mg | INTRAVENOUS | Status: DC
  Administered 2022-08-13: 400 mg via INTRAVENOUS
  Filled 2022-08-12 (×2): qty 200

## 2022-08-12 MED ORDER — ACETAMINOPHEN 650 MG RE SUPP: 650.0000 mg | Freq: Four times a day (QID) | RECTAL | Status: DC | PRN

## 2022-08-12 MED ORDER — CLOPIDOGREL BISULFATE 75 MG PO TABS
75.0000 mg | ORAL_TABLET | Freq: Every day | ORAL | Status: DC
  Administered 2022-08-12 – 2022-08-15 (×3): 75 mg via ORAL
  Filled 2022-08-12 (×3): qty 1

## 2022-08-12 MED ORDER — ALBUTEROL SULFATE (2.5 MG/3ML) 0.083% IN NEBU: 3.0000 mL | INHALATION_SOLUTION | Freq: Four times a day (QID) | RESPIRATORY_TRACT | Status: DC | PRN

## 2022-08-12 MED ORDER — ONDANSETRON HCL 4 MG/2ML IJ SOLN
4.0000 mg | Freq: Once | INTRAMUSCULAR | Status: AC
  Administered 2022-08-12: 4 mg via INTRAVENOUS
  Filled 2022-08-12: qty 2

## 2022-08-12 MED ORDER — METRONIDAZOLE 500 MG/100ML IV SOLN
500.0000 mg | INTRAVENOUS | Status: AC
  Administered 2022-08-12: 500 mg via INTRAVENOUS
  Filled 2022-08-12: qty 100

## 2022-08-12 MED ORDER — ASPIRIN 81 MG PO TBEC
81.0000 mg | DELAYED_RELEASE_TABLET | Freq: Every day | ORAL | Status: DC
  Administered 2022-08-12: 81 mg via ORAL
  Filled 2022-08-12: qty 1

## 2022-08-12 MED ORDER — CALCIUM GLUCONATE-NACL 1-0.675 GM/50ML-% IV SOLN
1.0000 g | Freq: Once | INTRAVENOUS | Status: AC
  Administered 2022-08-12: 1000 mg via INTRAVENOUS
  Filled 2022-08-12: qty 50

## 2022-08-12 MED ORDER — ACETAMINOPHEN 325 MG PO TABS
650.0000 mg | ORAL_TABLET | Freq: Four times a day (QID) | ORAL | Status: DC | PRN
  Administered 2022-08-14: 650 mg via ORAL
  Filled 2022-08-12: qty 2

## 2022-08-12 MED ORDER — IOHEXOL 350 MG/ML SOLN
75.0000 mL | Freq: Once | INTRAVENOUS | Status: AC | PRN
  Administered 2022-08-12: 75 mL via INTRAVENOUS

## 2022-08-12 MED ORDER — ONDANSETRON HCL 4 MG/2ML IJ SOLN: 4.0000 mg | Freq: Four times a day (QID) | INTRAMUSCULAR | Status: DC | PRN

## 2022-08-12 MED ORDER — CIPROFLOXACIN IN D5W 400 MG/200ML IV SOLN
400.0000 mg | INTRAVENOUS | Status: AC
  Administered 2022-08-12: 400 mg via INTRAVENOUS
  Filled 2022-08-12: qty 200

## 2022-08-12 MED ORDER — ONDANSETRON HCL 4 MG PO TABS: 4.0000 mg | ORAL_TABLET | Freq: Four times a day (QID) | ORAL | Status: DC | PRN

## 2022-08-12 MED ORDER — ACETAMINOPHEN 500 MG PO TABS
1000.0000 mg | ORAL_TABLET | ORAL | Status: AC
  Administered 2022-08-12: 1000 mg via ORAL
  Filled 2022-08-12: qty 2

## 2022-08-12 NOTE — Assessment & Plan Note (Addendum)
Receiving HD this AM and cleared to resume outpatient dialysis per Nephrology. - Nephrology following, appreciate recs

## 2022-08-12 NOTE — ED Triage Notes (Addendum)
Pt states he was seen in ED on Sunday with GI bleed; today pt coughing and endorsing coffee ground emesis; dialysis pt, last dialyzed past Saturday; denies pain currently; denies fevers; tremors noted in triage, pt states this is baseline

## 2022-08-12 NOTE — Progress Notes (Signed)
FMTS Brief Progress Note  S: Patient with recent hospitalization for C-diff colitis and recent Angioplasty and stenting of L external iliac on 3/4 presenting with abdominal pain, N/V/D. CT findings consistent with infectious vs inflammatory colitis, also noted to have a retroperitoneal hematoma.  C Diff stool panel is negative. Full GIPP pending.    O: BP (!) 147/73   Pulse 76   Temp 98.3 F (36.8 C) (Oral)   Resp (!) 32   SpO2 93%   Gen: Chronically ill appearing, tremulous, but NAD Eyes: Scleral icterus present  HENT: + JVD Cardio: Regular rate and rhythm, transmitted fistula sounds Pulm: Normal WOB on RA, poor air movement, lungs clear to my exam Abd: Without tenderness or distention   A/P: Colitis Presumed infectious source given fever, WBC 17.1 and imaging findings. C Diff panel reassuringly negative. FOBT negative.  - F/u GIPP - Continue Flagyl and Ciprofloxaxin  Retroperiotoneal hemorrhage  Hgb on admission 8.5. With his significant vascular disease, would maintain transfusion threshold of <8 for him. - Repeat CBC - Vascular surg recommends continuing ASA and plavix to prevent stent complications - HDS for now  ESRD with missed HD Volume Overload  HFrEF  Volume overloaded with BNP elevated to 4500. Nephro already consulted. Hopefully can get dialyzed tomorrow. - If decompensates, will contact nephro to arrange emergent dialysis     Eppie Gibson, MD 08/12/2022, 7:44 PM PGY-2, [facility] Night Resident  Please page [phone] with questions.

## 2022-08-12 NOTE — ED Notes (Signed)
Patient incontinent of stool, coffee brown stool noted, full bed sheets changed.

## 2022-08-12 NOTE — Assessment & Plan Note (Addendum)
Mildly volume overloaded in the setting of missed HD. Fluids management per Nephro -Holding home HF meds as above

## 2022-08-12 NOTE — Assessment & Plan Note (Addendum)
Improving diarrhea with mild LLQ pain in the setting of colitis. Continue supportive care

## 2022-08-12 NOTE — Assessment & Plan Note (Deleted)
Now on RA, denies SOB. CT showed trace pleural effusions likely in the setting of missed HD.

## 2022-08-12 NOTE — ED Notes (Signed)
Patient to CT.

## 2022-08-12 NOTE — Assessment & Plan Note (Addendum)
Mildly hypertensive -Restart home Entreso and Toprolol -Holding home Amlodipine to restart outpatient

## 2022-08-12 NOTE — ED Notes (Signed)
ED TO INPATIENT HANDOFF REPORT  ED Nurse Name and Phone #: [phone]  S Name/Age/Gender Albert [place] 67 y.o. male Room/Bed: 008C/008C  Code Status   Code Status: Full Code  Home/SNF/Other Home Patient oriented to: self, place, time, and situation Is this baseline? Yes   Triage Complete: Triage complete  Chief Complaint Colitis [K52.9]  Triage Note Pt states he was seen in ED on Sunday with GI bleed; today pt coughing and endorsing coffee ground emesis; dialysis pt, last dialyzed past Saturday; denies pain currently; denies fevers; tremors noted in triage, pt states this is baseline   Allergies Allergies  Allergen Reactions   Lidocaine-Prilocaine Rash    Caused red marks up and down arm    Level of Care/Admitting Diagnosis ED Disposition     ED Disposition  Admit   Condition  --   Altha: [place] [[id number]]  Level of Care: Telemetry Medical [[id number]]  May place patient in observation at [facility] or [place] if equivalent level of care is available:: Yes  Covid Evaluation: Asymptomatic - no recent exposure (last 10 days) testing not required  Diagnosis: Colitis [id number]  Admitting Physician: Rickey Primus  Attending Physician: Wendy Poet, TODD D [[id number]]          B Medical/Surgery History Past Medical History:  Diagnosis Date   Acute metabolic encephalopathy [id number]   Acute on chronic diastolic CHF (congestive heart failure) ([place]) 12/15/2020   Acute respiratory disease due to COVID-19 virus 06/16/2020   Alcohol use disorder 06/30/2022   Anemia    Asthma    Bacteremia due to Gram-positive bacteria 05/09/2021   Carotid stenosis 09/12/2019   CHF (congestive heart failure) ([place])    Claudication in peripheral vascular disease ([place]) [id number]   Complication of anesthesia    " i HAVE A HARD TIME WAKING UP " (only one time)   COPD (chronic obstructive pulmonary disease) ([place])    COVID-19    positive on   06/16/20   Critical limb ischemia of right lower extremity ([place])    ESRD on hemodialysis ([place])    t, th. sat dialysis   GERD (gastroesophageal reflux disease)    HLD (hyperlipidemia)    Hypertension    Hypertensive urgency 12/24/2016   Lower GI bleed    Peripheral vascular disease (HCC)    PFO with atrial septal aneurysm    by echo 11/2017   Pneumonia    Stroke [facility])    Surgical wound infection 09/20/2021   [name] (transient ischemic attack) 11/2017   Tobacco abuse    quit 07/17/19   Volume overload 12/14/2020   Past Surgical History:  Procedure Laterality Date   ABDOMINAL AORTOGRAM W/LOWER EXTREMITY N/A 06/21/2020   Procedure: ABDOMINAL AORTOGRAM W/LOWER EXTREMITY;  Surgeon: Angelia Mould, MD;  Location: [place] [facility];  Service: Cardiovascular;  Laterality: N/A;   ABDOMINAL AORTOGRAM W/LOWER EXTREMITY Right 08/29/2021   Procedure: ABDOMINAL AORTOGRAM W/LOWER EXTREMITY;  Surgeon: Cherre Robins, MD;  Location: [place] [facility];  Service: Cardiovascular;  Laterality: Right;   ABDOMINAL AORTOGRAM W/LOWER EXTREMITY N/A 08/10/2022   Procedure: ABDOMINAL AORTOGRAM W/LOWER EXTREMITY;  Surgeon: Angelia Mould, MD;  Location: [place] [facility];  Service: Cardiovascular;  Laterality: N/A;   APPLICATION OF WOUND VAC Right 09/21/2021   Procedure: APPLICATION OF WOUND VAC;  Surgeon: Angelia Mould, MD;  Location: [place];  Service: Vascular;  Laterality: Right;   AV FISTULA PLACEMENT Left 03/28/2020  Procedure: ARTERIOVENOUS (AV) FISTULA CREATION LEFT;  Surgeon: [name], MD;  Location: [place];  Service: Vascular;  Laterality: Left;   AV FISTULA PLACEMENT Right 09/23/2020   Procedure: RIGHT Arm ARTERIOVENOUS GRAFT CREATION.;  Surgeon: Angelia Mould, MD;  Location: [place];  Service: Vascular;  Laterality: Right;   COLONOSCOPY     COLONOSCOPY WITH PROPOFOL N/A 06/27/2020   Procedure: COLONOSCOPY WITH PROPOFOL;  Surgeon: Carol Ada, MD;  Location: [place];  Service: Endoscopy;  Laterality: N/A;   DRAINAGE AND CLOSURE OF LYMPHOCELE Right 09/21/2021   Procedure: EVACUATION OF LYMPHOCELE RIGHT GROIN;  Surgeon: Angelia Mould, MD;  Location: [place];  Service: Vascular;  Laterality: Right;   ENDARTERECTOMY FEMORAL Right 09/05/2021   Procedure: ENDARTERECTOMY FEMORAL;  Surgeon: Angelia Mould, MD;  Location: [place];  Service: Vascular;  Laterality: Right;   FEMORAL-POPLITEAL BYPASS GRAFT Right 09/05/2021   Procedure: RIGHT FEMORAL-POPLITEAL ARTERY BYPASS WITH Spaphenous Vein;  Surgeon: Angelia Mould, MD;  Location: [place];  Service: Vascular;  Laterality: Right;   [place]  06/27/2020   Procedure: HEMOSTASIS CLIP PLACEMENT;  Surgeon: Carol Ada, MD;  Location: [place];  Service: Endoscopy;;   INSERTION OF DIALYSIS CATHETER Right 06/24/2020   Procedure: INSERTION OF TUNNELED  DIALYSIS CATHETER; Removal of temporary dialysis catheter right neck;  Surgeon: Angelia Mould, MD;  Location: [place];  Service: Vascular;  Laterality: Right;   IR PARACENTESIS  06/30/2022   LIGATION OF ARTERIOVENOUS  FISTULA Left 07/24/2020   Procedure: LIGATION OF LEFT BRACHIOCEPHALIC ARTERIOVENOUS  FISTULA;  Surgeon: [name], MD;  Location: [place];  Service: Vascular;  Laterality: Left;   PERIPHERAL VASCULAR INTERVENTION Right 06/21/2020   Procedure: PERIPHERAL VASCULAR INTERVENTION;  Surgeon: Angelia Mould, MD;  Location: [place] [facility];  Service: Cardiovascular;  Laterality: Right;   PERIPHERAL VASCULAR INTERVENTION Left 08/10/2022   Procedure: PERIPHERAL VASCULAR INTERVENTION;  Surgeon: Angelia Mould, MD;  Location: [place] [facility];  Service: Cardiovascular;  Laterality: Left;  External Illiac   TEE WITHOUT CARDIOVERSION N/A 05/13/2021   Procedure: TRANSESOPHAGEAL ECHOCARDIOGRAM (TEE);  Surgeon: Donato Heinz, MD;  Location: [facility] ENDOSCOPY;  Service:  Cardiovascular;  Laterality: N/A;   VEIN HARVEST Right 09/05/2021   Procedure: RIGHT Saphenous VEIN HARVEST;  Surgeon: Angelia Mould, MD;  Location: [place];  Service: Vascular;  Laterality: Right;     A IV Location/Drains/Wounds Patient Lines/Drains/Airways Status     Active Line/Drains/Airways     Name Placement date Placement time Site Days   Peripheral IV 08/12/22 20 G Anterior;Left;Upper Arm 08/12/22  1510  Arm  less than 1   Fistula / Graft Right Upper arm Arteriovenous vein graft --  --  Upper arm  --   Negative Pressure Wound Therapy Thigh Anterior;Proximal;Right 09/21/21  0755  --  325   Wound / Incision (Open or Dehisced) 08/28/21 Other (Comment) Toe (Comment  which one) Anterior;Right blister to toe 08/28/21  1800  Toe (Comment  which one)  349            Intake/Output Last 24 hours  Intake/Output Summary (Last 24 hours) at 08/12/2022 2004 Last data filed at 08/12/2022 1912 Gross per 24 hour  Intake 138.28 ml  Output --  Net 138.28 ml    Labs/Imaging Results for orders placed or performed during the hospital encounter of 08/12/22 (from the past 48 hour(s))  Type and screen [place]  Status: None   Collection Time: 08/12/22  3:01 PM  Result Value Ref Range   ABO/RH(D) O POS    Antibody Screen NEG    Sample Expiration      08/15/2022,2359 Performed at [facility], [place] [address] [place], [place] [address]   Comprehensive metabolic panel     Status: Abnormal   Collection Time: 08/12/22  3:01 PM  Result Value Ref Range   Sodium 138 135 - 145 mmol/L   Potassium 5.0 3.5 - 5.1 mmol/L   Chloride 94 (L) 98 - 111 mmol/L   CO2 26 22 - 32 mmol/L   Glucose, Bld 118 (H) 70 - 99 mg/dL    Comment: Glucose reference range applies only to samples taken after fasting for at least 8 hours.   BUN 43 (H) 8 - 23 mg/dL   Creatinine, Ser 10.75 (H) 0.61 - 1.24 mg/dL   Calcium 9.3 8.9 - 10.3 mg/dL   Total Protein 7.7 6.5 - 8.1 g/dL   Albumin  3.5 3.5 - 5.0 g/dL   AST 18 15 - 41 U/L   ALT 8 0 - 44 U/L   Alkaline Phosphatase 106 38 - 126 U/L   Total Bilirubin 1.3 (H) 0.3 - 1.2 mg/dL   GFR, Estimated 5 (L) >60 mL/min    Comment: (NOTE) Calculated using the CKD-EPI Creatinine Equation (2021)    Anion gap 18 (H) 5 - 15    Comment: Performed at [facility] [address] [place], [place] [address]  Protime-INR     Status: None   Collection Time: 08/12/22  3:01 PM  Result Value Ref Range   Prothrombin Time 14.9 11.4 - 15.2 seconds   INR 1.2 0.8 - 1.2    Comment: (NOTE) INR goal varies based on device and disease states. Performed at [facility], [place] [address], [place], [place] [address]   Resp panel by RT-PCR (RSV, Flu A&B, Covid) Anterior Nasal Swab     Status: None   Collection Time: 08/12/22  3:03 PM   Specimen: Anterior Nasal Swab  Result Value Ref Range   SARS Coronavirus 2 by RT PCR NEGATIVE NEGATIVE   Influenza A by PCR NEGATIVE NEGATIVE   Influenza B by PCR NEGATIVE NEGATIVE    Comment: (NOTE) The Xpert Xpress SARS-CoV-2/FLU/RSV plus assay is intended as an aid in the diagnosis of influenza from Nasopharyngeal swab specimens and should not be used as a sole basis for treatment. Nasal washings and aspirates are unacceptable for Xpert Xpress SARS-CoV-2/FLU/RSV testing.  Fact Sheet for Patients: EntrepreneurPulse.com.au  Fact Sheet for Healthcare Providers: IncredibleEmployment.be  This test is not yet approved or cleared by the [place] FDA and has been authorized for detection and/or diagnosis of SARS-CoV-2 by FDA under an Emergency Use Authorization (EUA). This EUA will remain in effect (meaning this test can be used) for the duration of the COVID-19 declaration under Section 564(b)(1) of the Act, 21 U.S.C. section 360bbb-3(b)(1), unless the authorization is terminated or revoked.     Resp Syncytial Virus by PCR NEGATIVE NEGATIVE    Comment:  (NOTE) Fact Sheet for Patients: EntrepreneurPulse.com.au  Fact Sheet for Healthcare Providers: IncredibleEmployment.be  This test is not yet approved or cleared by the [place] FDA and has been authorized for detection and/or diagnosis of SARS-CoV-2 by FDA under an Emergency Use Authorization (EUA). This EUA will remain in effect (meaning this test can be used) for the duration of the COVID-19 declaration under  Section 564(b)(1) of the Act, 21 U.S.C. section 360bbb-3(b)(1), unless the authorization is terminated or revoked.  Performed at [facility], [place] [address] [place], [place] [address]   C Difficile Quick Screen w PCR reflex     Status: None   Collection Time: 08/12/22  3:03 PM   Specimen: Stool  Result Value Ref Range   C Diff antigen NEGATIVE NEGATIVE   C Diff toxin NEGATIVE NEGATIVE   C Diff interpretation No C. difficile detected.     Comment: Performed at [facility], [place] [address], [place], [place] [address]  CBC with Differential     Status: Abnormal   Collection Time: 08/12/22  3:20 PM  Result Value Ref Range   WBC 17.1 (H) 4.0 - 10.5 K/uL   RBC 2.50 (L) 4.22 - 5.81 MIL/uL   Hemoglobin 8.5 (L) 13.0 - 17.0 g/dL   HCT 26.6 (L) 39.0 - 52.0 %   MCV 106.4 (H) 80.0 - 100.0 fL   MCH 34.0 26.0 - 34.0 pg   MCHC 32.0 30.0 - 36.0 g/dL   RDW 17.8 (H) 11.5 - 15.5 %   Platelets 154 150 - 400 K/uL   nRBC 0.0 0.0 - 0.2 %   Neutrophils Relative % 88 %   Neutro Abs 15.1 (H) 1.7 - 7.7 K/uL   Lymphocytes Relative 5 %   Lymphs Abs 0.8 0.7 - 4.0 K/uL   Monocytes Relative 6 %   Monocytes Absolute 1.1 (H) 0.1 - 1.0 K/uL   Eosinophils Relative 0 %   Eosinophils Absolute 0.0 0.0 - 0.5 K/uL   Basophils Relative 0 %   Basophils Absolute 0.1 0.0 - 0.1 K/uL   Immature Granulocytes 1 %   Abs Immature Granulocytes 0.09 (H) 0.00 - 0.07 K/uL    Comment: Performed at [facility] [address] [place], [place] [address]   Brain natriuretic peptide     Status: Abnormal   Collection Time: 08/12/22  3:20 PM  Result Value Ref Range   B Natriuretic Peptide >4,500.0 (H) 0.0 - 100.0 pg/mL    Comment: Performed at [facility] [address], [place], [place] [address]  POC occult blood, ED     Status: None   Collection Time: 08/12/22  5:00 PM  Result Value Ref Range   Fecal Occult Bld NEGATIVE NEGATIVE   CT ABDOMEN PELVIS W CONTRAST  Result Date: 08/12/2022 CLINICAL DATA:  Abdominal pain, nausea, vomiting, diarrhea, recent diagnosis of C. difficile EXAM: CT ABDOMEN AND PELVIS WITH CONTRAST TECHNIQUE: Multidetector CT imaging of the abdomen and pelvis was performed using the standard protocol following bolus administration of intravenous contrast. RADIATION DOSE REDUCTION: This exam was performed according to the departmental dose-optimization program which includes automated exposure control, adjustment of the mA and/or kV according to patient size and/or use of iterative reconstruction technique. CONTRAST:  44m OMNIPAQUE IOHEXOL 350 MG/ML SOLN COMPARISON:  01/01/2022 FINDINGS: Lower chest: Cardiomegaly.  Trace bilateral pleural effusions. Hepatobiliary: No focal liver abnormality is seen. Hyperattenuating fluid within the gallbladder suggesting vicarious excretion of contrast. No pericholecystic inflammatory changes by CT. No biliary dilatation. Pancreas: Splenule is again noted abutting the distal pancreatic tail. Pancreas appears otherwise unremarkable. No pancreatic ductal dilatation or surrounding inflammatory changes. Spleen: Normal in size without focal abnormality. Adrenals/Urinary Tract: Adrenal glands are unremarkable. Kidneys are normal, without renal calculi, focal lesion, or hydronephrosis. Bladder is decompressed. Stomach/Bowel: Stomach within normal limits. No dilated loops of bowel. Long segment mild  circumferential wall thickening of the sigmoid colon, and to a lesser degree involving the descending  colon. There are numerous left-sided colonic diverticula, although no focally inflamed diverticulum is seen. A normal appendix is seen in the right lower quadrant. Vascular/Lymphatic: Severe aortoiliac atherosclerosis without aneurysm. No abdominopelvic lymphadenopathy. Reproductive: Prostate is unremarkable. Other: Trace perihepatic ascites. Small volume retroperitoneal fluid within the anterolateral aspect of the left hemipelvis measuring greater than fluid density. Approximate measurements of 6.5 x 1.8 x 5.1 cm (volume = 31 cm^3). No pneumoperitoneum. Musculoskeletal: No acute or significant osseous findings. IMPRESSION: 1. Small volume retroperitoneal fluid within the left hemipelvis measuring greater than fluid density, suggestive of a retroperitoneal hematoma of indeterminate etiology. 2. Long segment mild circumferential wall thickening of the sigmoid colon, and to a lesser degree involving the descending colon. Findings are suggestive of an infectious or inflammatory colitis. 3. Trace perihepatic ascites. 4. Trace bilateral pleural effusions. 5. Severe aortoiliac atherosclerosis (ICD10-I70.0). Electronically Signed   By: Davina Poke D.O.   On: 08/12/2022 16:41   DG Chest Port 1 View  Result Date: 08/12/2022 CLINICAL DATA:  Shortness of breath EXAM: PORTABLE CHEST 1 VIEW COMPARISON:  CXR 07/21/22 FINDINGS: No pleural effusion. No pneumothorax. Unchanged cardiomegaly. Unchanged mediastinal contours. Focal airspace opacity. Redemonstrated are prominent bilateral interstitial opacities, which could represent pulmonary venous congestion or pulmonary edema. No radiographically apparent displaced rib fractures. Visualized upper abdomen is unremarkable. IMPRESSION: 1. Unchanged cardiomegaly with prominent bilateral interstitial opacities, which could represent pulmonary venous congestion or pulmonary edema. 2. No focal airspace opacity. Electronically Signed   By: Marin Roberts M.D.   On: 08/12/2022 14:41     Pending Labs Unresulted Labs (From admission, onward)     Start     Ordered   08/13/22 0500  Renal function panel  Tomorrow morning,   R        08/12/22 1820   08/13/22 0500  CBC  Tomorrow morning,   R        08/12/22 1826   08/12/22 2200  CBC  Once,   R        08/12/22 1826   08/12/22 1503  Gastrointestinal Panel by PCR , Stool  (Gastrointestinal Panel by PCR, Stool                                                                                                                                                     **Does Not include CLOSTRIDIUM DIFFICILE testing. **If CDIFF testing is needed, place order from the "C Difficile Testing" order set.**)  Once,   URGENT        08/12/22 1503   08/12/22 1310  Urinalysis, w/ Reflex to Culture (Infection Suspected) -Urine, Clean Catch  Once,   URGENT       Question:  Specimen Source  Answer:  Urine, Clean Catch  08/12/22 1309            Vitals/Pain Today's Vitals   08/12/22 1808 08/12/22 1830 08/12/22 1900 08/12/22 1913  BP:  126/75 (!) 147/73   Pulse:  77 76   Resp:  (!) 27 (!) 32   Temp:    98.3 F (36.8 C)  TempSrc:    Oral  SpO2:  92% 93%   PainSc: 3        Isolation Precautions Enteric precautions (UV disinfection)  Medications Medications  ciprofloxacin (CIPRO) IVPB 400 mg (400 mg Intravenous New Bag/Given 08/12/22 1915)  aspirin EC tablet 81 mg (has no administration in time range)  clopidogrel (PLAVIX) tablet 75 mg (has no administration in time range)  albuterol (PROVENTIL) (2.5 MG/3ML) 0.083% nebulizer solution 3 mL (has no administration in time range)  acetaminophen (TYLENOL) tablet 650 mg (has no administration in time range)    Or  acetaminophen (TYLENOL) suppository 650 mg (has no administration in time range)  ondansetron (ZOFRAN) tablet 4 mg (has no administration in time range)    Or  ondansetron (ZOFRAN) injection 4 mg (has no administration in time range)  metroNIDAZOLE (FLAGYL) IVPB 500 mg (has no  administration in time range)  ciprofloxacin (CIPRO) IVPB 400 mg (has no administration in time range)  calcium gluconate 1 g/ 50 mL sodium chloride IVPB (0 mg Intravenous Stopped 08/12/22 1742)  iohexol (OMNIPAQUE) 350 MG/ML injection 75 mL (75 mLs Intravenous Contrast Given 08/12/22 1626)  ondansetron (ZOFRAN) injection 4 mg (4 mg Intravenous Given 08/12/22 1639)  metroNIDAZOLE (FLAGYL) IVPB 500 mg (0 mg Intravenous Stopped 08/12/22 1912)  acetaminophen (TYLENOL) tablet 1,000 mg (1,000 mg Oral Given 08/12/22 1720)    Mobility walks     Focused Assessments Renal Assessment Handoff:  Hemodialysis Schedule: Hemodialysis Schedule: Tuesday/Thursday/Saturday Last Hemodialysis date and time: Saturday 08/08/2022   Restricted appendage: right      R Recommendations: See Admitting Provider Note  Report given to:   Additional Notes: Patient will be sent to the floor when the bed is marked ready/clean. In the main time, please call me if you have a question

## 2022-08-12 NOTE — ED Provider Triage Note (Signed)
Emergency Medicine Provider Triage Evaluation Note  MATTHIAS COWSERT , a 67 y.o. male  was evaluated in triage.  Pt complains of coffee-ground emesis.  Had 1 episode yesterday and 1 episode today.  Was discharged from the hospital 2 days ago.  Had abdominal aortogram.  States he has had episodes of coffee-ground emesis in the past but has been for years.  Also in ESRD patients with dialysis 3 days/week.  States he typically goes Monday, Wednesday and Friday.  Also reports dizziness, lightheadedness and shortness of breath compared to baseline.  May have some confusion as he told triage nurse that his last dialysis was on Saturday.  Review of Systems  Positive: As above Negative: As above  Physical Exam  BP (!) 160/105   Pulse (!) 56   Temp 100.2 F (37.9 C)   Resp (!) 22   SpO2 96%  Gen:   Awake, no distress  Resp:  Normal effort  MSK:   Moves extremities without difficulty  Other:  Right-sided tremor which she states is baseline.  No abdominal tenderness to palpation  Medical Decision Making  Medically screening exam initiated at 12:56 PM.  Appropriate orders placed.  YOVANI MACFARLAND was informed that the remainder of the evaluation will be completed by another provider, this initial triage assessment does not replace that evaluation, and the importance of remaining in the ED until their evaluation is complete.     [name], [place] 08/12/22 1258

## 2022-08-12 NOTE — Assessment & Plan Note (Addendum)
VVS did not recommend further intervention at this time. Hgb stable 9.1 today. -Vascular surgery signed off -Continue Plavix and ASA for recent stent

## 2022-08-12 NOTE — ED Provider Notes (Incomplete)
[facility] Provider Note   CSN: [id number] Arrival date & time: 08/12/22  1210     History {Add pertinent medical, surgical, social history, OB history to HPI:1} Chief Complaint  Patient presents with   Nausea   Hematemesis    Albert Harris is a 68 y.o. male.  67 year old male with history of ESRD on Tuesday, Thursday, Saturday IHD, heart failure, peripheral artery disease status post left iliac stents, and recurrent C. difficile who presents emergency department with abdominal pain, nausea, vomiting and diarrhea.  Patient was hospitalized and discharged on 07/24/2022 for C. difficile.  Went home and was treated with fidaxomicin and his symptoms resolved.  Then yesterday started experiencing abdominal pain and nausea and vomiting and diarrhea.  Abdominal pain is diffuse.  Reports that diarrhea has been very dark and black.  Is on aspirin and Plavix but denies any other blood thinners.  Also had 3 episodes of coffee-ground emesis.  Also has had cough and shortness of breath recently.  Denies any runny nose or sore throat.  No fevers at home. Did miss Saturday iHD.  Of note, patient did have left external iliac stenting on 08/10/2022.  But reports no issues with the surgical site.        Home Medications Prior to Admission medications   Medication Sig Start Date End Date Taking? Authorizing Provider  albuterol (VENTOLIN HFA) 108 (90 Base) MCG/ACT inhaler INHALE 1-2 PUFFS INTO THE LUNGS EVERY 6 (SIX) HOURS AS NEEDED FOR WHEEZING OR SHORTNESS OF BREATH. 07/02/20 07/22/22  Maness, Arnette Norris, MD  amLODipine (NORVASC) 10 MG tablet Take 1 tablet (10 mg total) by mouth daily. 05/07/22   Kerin Perna, NP  aspirin EC 81 MG tablet Take 1 tablet (81 mg total) by mouth daily. Swallow whole. 08/10/22 08/10/23  Angelia Mould, MD  B Complex-C-Zn-Folic Acid (DIALYVITE/ZINC) TABS Take 1 tablet by mouth daily. 04/25/22   [provider]   calcium acetate (PHOSLO) 667 MG capsule Take 2 capsule by mouth three times a day with meals Patient taking differently: Take 1,334 mg by mouth 3 (three) times daily with meals. 06/17/21     clopidogrel (PLAVIX) 75 MG tablet Take 1 tablet (75 mg total) by mouth daily. 08/10/22   Angelia Mould, MD  metoprolol succinate (TOPROL-XL) 25 MG 24 hr tablet Take 1 tablet (25 mg total) by mouth daily. 07/01/22   Precious Gilding, DO  rosuvastatin (CRESTOR) 10 MG tablet Take 1 tablet (10 mg total) by mouth daily. 08/10/22 08/10/23  Angelia Mould, MD  sacubitril-valsartan (ENTRESTO) 24-26 MG Take 1 tablet by mouth 2 (two) times daily. 07/01/22   Precious Gilding, DO      Allergies    Lidocaine-prilocaine    Review of Systems   Review of Systems  Physical Exam Updated Vital Signs BP (!) 148/86 (BP Location: Left Arm)   Pulse 96   Temp 99.4 F (37.4 C) (Oral)   Resp (!) 38   SpO2 93%  Physical Exam Vitals and nursing note reviewed.  Constitutional:      General: He is not in acute distress.    Appearance: He is well-developed.  HENT:     Head: Normocephalic and atraumatic.     Right Ear: External ear normal.     Left Ear: External ear normal.     Nose: Nose normal.  Eyes:     General: Scleral icterus present.     Extraocular Movements: Extraocular movements intact.  Conjunctiva/sclera: Conjunctivae normal.     Pupils: Pupils are equal, round, and reactive to light.  Cardiovascular:     Rate and Rhythm: Normal rate and regular rhythm.     Heart sounds: Normal heart sounds.  Pulmonary:     Effort: Pulmonary effort is normal. No respiratory distress.     Breath sounds: Wheezing present.  Abdominal:     General: There is no distension.     Palpations: Abdomen is soft. There is no mass.     Tenderness: There is abdominal tenderness (Diffuse). There is no guarding.  Genitourinary:    Comments: Chaperoned by patient's RN Nadine. External hemorrhoids or fissures noted on external  inspection. No internal masses noted. Normal rectal tone. Brown stool in rectal vault. No melena or gross blood.  Musculoskeletal:     Cervical back: Normal range of motion and neck supple.     Right lower leg: No edema.     Left lower leg: No edema.  Skin:    General: Skin is warm and dry.  Neurological:     Mental Status: He is alert. Mental status is at baseline.  Psychiatric:        Mood and Affect: Mood normal.        Behavior: Behavior normal.     ED Results / Procedures / Treatments   Labs (all labs ordered are listed, but only abnormal results are displayed) Labs Reviewed  COMPREHENSIVE METABOLIC PANEL  CBC WITH DIFFERENTIAL/PLATELET  PROTIME-INR  BRAIN NATRIURETIC PEPTIDE  URINALYSIS, W/ REFLEX TO CULTURE (INFECTION SUSPECTED)  TYPE AND SCREEN    EKG None  Radiology DG Chest Port 1 View  Result Date: 08/12/2022 CLINICAL DATA:  Shortness of breath EXAM: PORTABLE CHEST 1 VIEW COMPARISON:  CXR 07/21/22 FINDINGS: No pleural effusion. No pneumothorax. Unchanged cardiomegaly. Unchanged mediastinal contours. Focal airspace opacity. Redemonstrated are prominent bilateral interstitial opacities, which could represent pulmonary venous congestion or pulmonary edema. No radiographically apparent displaced rib fractures. Visualized upper abdomen is unremarkable. IMPRESSION: 1. Unchanged cardiomegaly with prominent bilateral interstitial opacities, which could represent pulmonary venous congestion or pulmonary edema. 2. No focal airspace opacity. Electronically Signed   By: Marin Roberts M.D.   On: 08/12/2022 14:41    Procedures Procedures  {Document cardiac monitor, telemetry assessment procedure when appropriate:1}  Medications Ordered in ED Medications - No data to display  ED Course/ Medical Decision Making/ A&P   {   Click here for ABCD2, HEART and other calculatorsREFRESH Note before signing :1}                          Medical Decision Making Amount and/or Complexity  of Data Reviewed Radiology: ordered.   ***  {Document critical care time when appropriate:1} {Document review of labs and clinical decision tools ie heart score, Chads2Vasc2 etc:1}  {Document your independent review of radiology images, and any outside records:1} {Document your discussion with family members, caretakers, and with consultants:1} {Document social determinants of health affecting pt's care:1} {Document your decision making why or why not admission, treatments were needed:1} Final Clinical Impression(s) / ED Diagnoses Final diagnoses:  None    Rx / DC Orders ED Discharge Orders     None

## 2022-08-12 NOTE — H&P (Cosign Needed Addendum)
Hospital Admission History and Physical Service Pager: [phone]  Patient name: Albert Harris Medical record number: [id number] Date of Birth: August 31, 1955 Age: 67 y.o. Gender: male  Primary Care Provider: Kerin Perna, NP Consultants: Nephrology  Code Status: Full Code  Preferred Emergency Contact:  Contact Information     Name Relation Home Work [place] Spouse [phone]  [phone]   [facility] Daughter   [phone]   Harris,Albert Daughter   [phone]       Chief Complaint: Abdominal Pain   Assessment and Plan: Albert Harris is a 67 y.o. male presenting with abdominal pain N/V/D. Differential for this patient's presentation of this includes colitis secondary to chronic and persistent C. difficile infection.   Although initial testing of C. difficile was negative, strong suspicion for recurrent C. difficile colitis.  Will await stool PCR results and consult infectious disease in the morning to help with antibiotic choice.  Will continue Flagyl and Cipro.  * Colitis CHF labs initially testing negative, although high suspicion for recurrent C. difficile infection with past history.  Will await stool PCR and continue contact precautions. - Admit to FMTS, attending Dr. McDiarmid  - Med/tele, Vital signs per floor - Clear liquid diet  - Contact precautions  - Consult ID tomorrow  - PT/OT to treat - Continue antibiotics:  - Flagyl (3/6 - )  - Cipro (3/6 - ) - AM CBC/RFP - Fall precautions - Delirium precautions - Blood cultures not obtained prior to antibiotic administration, if patient fevers once he has been on antibiotics for 24 hours will collect blood cultures  Retroperitoneal hematoma Noted on CT abdomen.  S/p left external iliac stent on 3/4.  Patient reports complications during procedure.  Hemoglobin dropped from 10 to 8.5.  - Vascular surgery consulted - Cont ASA and Plavix per vascular surgery to prevent stent complications - CBC  0000000 and CBC in AM   ESRD on dialysis [facility]) Nephrology consulted in the ED.  Patient missed dialysis yesterday due to not feeling well. - Dialysis TTS - Nephrology consulted in the ED - RFP tomorrow morning   Acute respiratory failure with hypoxia ([place]) Initially needed oxygen support at ED presentation, on admission stable at room air.  CT scan showed bilateral pleural effusions consistent with volume overload.  - wean O2 as tolerated  - suspect improvement with dialysis   HFrEF (heart failure with reduced ejection fraction) (HCC) BNP elevated 4500.  Suspect volume overload secondary to missed dialysis rather than acute CHF exacerbation.  Echo completed 06/2022: LVEF 35 to 40%, moderately decreased LV function, moderate concentric LVH, G1DD  Hypertension Holding home medications: Entresto, Toprol, amlodipine due to soft blood pressures. - Monitor BP and add back as indicated    FEN/GI: Clear liquid diet VTE Prophylaxis: Plavix  Disposition: Med/tele, attending Dr. McDiarmid   History of Present Illness:  Albert Harris is a 67 y.o. male presenting with abdominal pain, vomiting, and diarrhea.  Patient reports diarrhea that started yesterday and nonbloody vomiting that started this morning. Was previously hospitalized and treated for C. Diff which patient completed course and was no longer having diarrhea for about a week before starting yesterday. Has not noticed any blood in stool but endorses dark stool.   Patient reported having a procedure for intrabdominal stent placement 08/10/22   Patient missed dialysis yesterday due to not feeling well.  Since then his daughters reported him having shallow breathing.  In the ED, VS were stable, FOBT  negative; C diff initial testing negative, CT abdomen showing colitis and was started on cipro and flagyl; CT showed retroperitoneal hematoma and vascular surgery consulted. VSS recommended continuing aspirin and Plavix due to recent stent  placement, if patient becomes unstable overnight or has a severe drop in hemoglobin to page them overnight. Vascular to follow up in the morning.  Patient volume overloaded on exam and nephrology was paged in the ED, will need dialysis.   Review Of Systems: Per HPI with the following additions: as above   Pertinent Past Medical History: ESRD on dialysis TTS  HFrEF (EF35-40%)  Liver Cirrhosis  COPD Hx of stroke PAD Hx of lymphedema of R leg Remainder reviewed in history tab.   Pertinent Past Surgical History: BL LE femoral bypass    08/10/22 left external iliac stent placed  Remainder reviewed in history tab.   Pertinent Social History: Tobacco use: Former 2 ppd, quit 2 years ago  Alcohol use: Former, 6 pck of beer per day from [age] yo Other Substance use: denies Lives with wife  Pertinent Family History: Mother: HTN Father: Colon cancer  Remainder reviewed in history tab.   Important Outpatient Medications: Amlodipine Metoprolol Phoslo Dialyvite Entresto (not taking) Remainder reviewed in medication history.   Objective: BP 119/62   Pulse 81   Temp (!) 101 F (38.3 C) (Oral)   Resp (!) 28   SpO2 93%  Exam: Chronically ill-appearing, no acute distress Cardio: Regular rate, regular rhythm, no murmurs on exam. Pulm: Poor air movement, diminished at bases. No increased work of breathing Abdominal: bowel sounds present, soft, non-tender, non-distended Extremities: unilateral right sided peripheral edema, consistent with prior exams with DVT previously r/o Neuro: alert and oriented x3, speech normal in content, no facial asymmetry, strength intact and equal bilaterally in UE and LE, pupils equal and reactive to light.    Labs:  CBC BMET  Recent Labs  Lab 08/12/22 1520  WBC 17.1*  HGB 8.5*  HCT 26.6*  PLT 154   Recent Labs  Lab 08/12/22 1501  NA 138  K 5.0  CL 94*  CO2 26  BUN 43*  CREATININE 10.75*  GLUCOSE 118*  CALCIUM 9.3    Pertinent additional  labs FOBT negative, C diff negative, BNP 4,500.  EKG: sinus tachycardia, consistent with prior EKGs   Imaging Studies Performed:  CXR:  1. Unchanged cardiomegaly with prominent bilateral interstitial opacities, which could represent pulmonary venous congestion or pulmonary edema. 2. No focal airspace opacity.  CT Abdomen w Contrast:  1. Small volume retroperitoneal fluid within the left hemipelvis measuring greater than fluid density, suggestive of a retroperitoneal hematoma of indeterminate etiology. 2. Long segment mild circumferential wall thickening of the sigmoid colon, and to a lesser degree involving the descending colon. Findings are suggestive of an infectious or inflammatory colitis. 3. Trace perihepatic ascites. 4. Trace bilateral pleural effusions. 5. Severe aortoiliac atherosclerosis (ICD10-I70.0).   Darci Current, DO 08/12/2022, 6:55 PM PGY-1, [facility] Intern pager: [phone], text pages welcome Secure chat group [place]   I was personally present and re-performed the exam. I verified the service and findings are accurately documented in the intern note. Additionally it is possible colitis is secondary to recent C diff infection and give his acute N/V/D would not entire rule out possibility of concurrent viral gastroenteritis.  Alen Bleacher, MD 08/12/2022 7:21 PM

## 2022-08-13 ENCOUNTER — Encounter (HOSPITAL_COMMUNITY): Payer: Self-pay | Admitting: Family Medicine

## 2022-08-13 DIAGNOSIS — I890 Lymphedema, not elsewhere classified: Secondary | ICD-10-CM | POA: Diagnosis present

## 2022-08-13 DIAGNOSIS — I70222 Atherosclerosis of native arteries of extremities with rest pain, left leg: Secondary | ICD-10-CM | POA: Diagnosis present

## 2022-08-13 DIAGNOSIS — R112 Nausea with vomiting, unspecified: Secondary | ICD-10-CM | POA: Diagnosis not present

## 2022-08-13 DIAGNOSIS — Z8616 Personal history of COVID-19: Secondary | ICD-10-CM | POA: Diagnosis not present

## 2022-08-13 DIAGNOSIS — I5042 Chronic combined systolic (congestive) and diastolic (congestive) heart failure: Secondary | ICD-10-CM | POA: Diagnosis present

## 2022-08-13 DIAGNOSIS — T39016A Underdosing of aspirin, initial encounter: Secondary | ICD-10-CM | POA: Diagnosis present

## 2022-08-13 DIAGNOSIS — E785 Hyperlipidemia, unspecified: Secondary | ICD-10-CM | POA: Diagnosis present

## 2022-08-13 DIAGNOSIS — I97618 Postprocedural hemorrhage and hematoma of a circulatory system organ or structure following other circulatory system procedure: Secondary | ICD-10-CM | POA: Diagnosis present

## 2022-08-13 DIAGNOSIS — N186 End stage renal disease: Secondary | ICD-10-CM

## 2022-08-13 DIAGNOSIS — Q2112 Patent foramen ovale: Secondary | ICD-10-CM | POA: Diagnosis not present

## 2022-08-13 DIAGNOSIS — I708 Atherosclerosis of other arteries: Secondary | ICD-10-CM | POA: Diagnosis present

## 2022-08-13 DIAGNOSIS — Z800 Family history of malignant neoplasm of digestive organs: Secondary | ICD-10-CM | POA: Diagnosis not present

## 2022-08-13 DIAGNOSIS — K7460 Unspecified cirrhosis of liver: Secondary | ICD-10-CM | POA: Diagnosis present

## 2022-08-13 DIAGNOSIS — A0811 Acute gastroenteropathy due to Norwalk agent: Secondary | ICD-10-CM | POA: Diagnosis present

## 2022-08-13 DIAGNOSIS — Y838 Other surgical procedures as the cause of abnormal reaction of the patient, or of later complication, without mention of misadventure at the time of the procedure: Secondary | ICD-10-CM | POA: Diagnosis present

## 2022-08-13 DIAGNOSIS — I898 Other specified noninfective disorders of lymphatic vessels and lymph nodes: Secondary | ICD-10-CM | POA: Diagnosis present

## 2022-08-13 DIAGNOSIS — Z8673 Personal history of transient ischemic attack (TIA), and cerebral infarction without residual deficits: Secondary | ICD-10-CM | POA: Diagnosis not present

## 2022-08-13 DIAGNOSIS — Z91158 Patient's noncompliance with renal dialysis for other reason: Secondary | ICD-10-CM | POA: Diagnosis not present

## 2022-08-13 DIAGNOSIS — J9601 Acute respiratory failure with hypoxia: Secondary | ICD-10-CM | POA: Diagnosis present

## 2022-08-13 DIAGNOSIS — Z992 Dependence on renal dialysis: Secondary | ICD-10-CM

## 2022-08-13 DIAGNOSIS — I132 Hypertensive heart and chronic kidney disease with heart failure and with stage 5 chronic kidney disease, or end stage renal disease: Secondary | ICD-10-CM | POA: Diagnosis present

## 2022-08-13 DIAGNOSIS — Z91128 Patient's intentional underdosing of medication regimen for other reason: Secondary | ICD-10-CM | POA: Diagnosis not present

## 2022-08-13 DIAGNOSIS — R197 Diarrhea, unspecified: Secondary | ICD-10-CM

## 2022-08-13 DIAGNOSIS — Z87891 Personal history of nicotine dependence: Secondary | ICD-10-CM | POA: Diagnosis not present

## 2022-08-13 DIAGNOSIS — D631 Anemia in chronic kidney disease: Secondary | ICD-10-CM | POA: Diagnosis present

## 2022-08-13 DIAGNOSIS — K683 Retroperitoneal hematoma: Secondary | ICD-10-CM | POA: Diagnosis present

## 2022-08-13 HISTORY — DX: Nausea with vomiting, unspecified: R11.2

## 2022-08-13 LAB — GASTROINTESTINAL PANEL BY PCR, STOOL (REPLACES STOOL CULTURE)

## 2022-08-13 LAB — RENAL FUNCTION PANEL
Albumin: 2.9 g/dL — ABNORMAL LOW (ref 3.5–5.0)
Anion gap: 16 — ABNORMAL HIGH (ref 5–15)
BUN: 48 mg/dL — ABNORMAL HIGH (ref 8–23)
CO2: 24 mmol/L (ref 22–32)
Calcium: 8.7 mg/dL — ABNORMAL LOW (ref 8.9–10.3)
Chloride: 95 mmol/L — ABNORMAL LOW (ref 98–111)
Creatinine, Ser: 11.63 mg/dL — ABNORMAL HIGH (ref 0.61–1.24)
GFR, Estimated: 4 mL/min — ABNORMAL LOW (ref >60)
Glucose, Bld: 114 mg/dL — ABNORMAL HIGH (ref 70–99)
Phosphorus: 6.4 mg/dL — ABNORMAL HIGH (ref 2.5–4.6)
Potassium: 4.9 mmol/L (ref 3.5–5.1)
Sodium: 135 mmol/L (ref 135–145)

## 2022-08-13 LAB — CBC
HCT: 23.3 % — ABNORMAL LOW (ref 39.0–52.0)
Hemoglobin: 7.5 g/dL — ABNORMAL LOW (ref 13.0–17.0)
MCH: 33.9 pg (ref 26.0–34.0)
MCHC: 32.2 g/dL (ref 30.0–36.0)
MCV: 105.4 fL — ABNORMAL HIGH (ref 80.0–100.0)
Platelets: 121 10*3/uL — ABNORMAL LOW (ref 150–400)
RBC: 2.21 MIL/uL — ABNORMAL LOW (ref 4.22–5.81)
RDW: 17.5 % — ABNORMAL HIGH (ref 11.5–15.5)
WBC: 16.7 10*3/uL — ABNORMAL HIGH (ref 4.0–10.5)
nRBC: 0 % (ref 0.0–0.2)

## 2022-08-13 LAB — HEMOGLOBIN AND HEMATOCRIT, BLOOD
HCT: 29.8 % — ABNORMAL LOW (ref 39.0–52.0)
Hemoglobin: 9.5 g/dL — ABNORMAL LOW (ref 13.0–17.0)

## 2022-08-13 LAB — HEPATITIS B SURFACE ANTIGEN: Hepatitis B Surface Ag: NONREACTIVE

## 2022-08-13 LAB — PREPARE RBC (CROSSMATCH)

## 2022-08-13 LAB — OCCULT BLOOD, POC DEVICE
Fecal Occult Bld: NEGATIVE
Fecal Occult Bld: NEGATIVE

## 2022-08-13 MED ORDER — CHLORHEXIDINE GLUCONATE CLOTH 2 % EX PADS: 6.0000 | MEDICATED_PAD | Freq: Every day | CUTANEOUS | Status: DC

## 2022-08-13 MED ORDER — CHLORHEXIDINE GLUCONATE CLOTH 2 % EX PADS
6.0000 | MEDICATED_PAD | Freq: Every day | CUTANEOUS | Status: DC
  Administered 2022-08-15: 6 via TOPICAL

## 2022-08-13 MED ORDER — PANTOPRAZOLE SODIUM 40 MG PO TBEC
40.0000 mg | DELAYED_RELEASE_TABLET | Freq: Two times a day (BID) | ORAL | Status: DC
  Administered 2022-08-13 – 2022-08-15 (×5): 40 mg via ORAL
  Filled 2022-08-13 (×5): qty 1

## 2022-08-13 MED ORDER — CALCITRIOL 0.25 MCG PO CAPS
0.2500 ug | ORAL_CAPSULE | ORAL | Status: DC
  Administered 2022-08-15: 0.25 ug via ORAL
  Filled 2022-08-13 (×2): qty 1

## 2022-08-13 NOTE — Evaluation (Signed)
Occupational Therapy Evaluation Patient Details Name: Albert Harris MRN: [id number] DOB: 1956/04/14 Today's Date: 08/13/2022   History of Present Illness Pt presents to ER on 3/6 for abdominal pain, nausea, vomiting diarrhea. Recently hspitalized fro c-diff colitis and recent angioplasty and stenting of L external iliac on 3/4. PMH: ESRD, HfrEF, liver cirrhosis, COPD, CVA, PAD, RLE lymphedema.   Clinical Impression   PTA, pt lived with wife who has recently been assisting with LB ADL and various IADL. Upon eval, pt presents with decreased safety, awareness, executive function, balance, and safety. Pt's daughter reports decr cognition and balance as compared to his baseline. Pt requiring min guard A for all OOB mobility and ADL. Recommending discharge home with HHOT to optimize safety and independence in ADL and IADL.      Recommendations for follow up therapy are one component of a multi-disciplinary discharge planning process, led by the attending physician.  Recommendations may be updated based on patient status, additional functional criteria and insurance authorization.   Follow Up Recommendations  Home health OT     Assistance Recommended at Discharge Intermittent Supervision/Assistance  Patient can return home with the following A little help with bathing/dressing/bathroom;A little help with walking and/or transfers;Direct supervision/assist for medications management;Direct supervision/assist for financial management;Assist for transportation;Help with stairs or ramp for entrance    Functional Status Assessment  Patient has had a recent decline in their functional status and demonstrates the ability to make significant improvements in function in a reasonable and predictable amount of time.  Equipment Recommendations  None recommended by OT    Recommendations for Other Services       Precautions / Restrictions Precautions Precautions: Fall (high) Restrictions Weight Bearing  Restrictions: No      Mobility Bed Mobility Overal bed mobility: Modified Independent             General bed mobility comments: HOB flat and minimal use of bed rails.    Transfers Overall transfer level: Needs assistance Equipment used: None Transfers: Sit to/from Stand Sit to Stand: Min guard           General transfer comment: for safety      Balance Overall balance assessment: Needs assistance Sitting-balance support: No upper extremity supported, Feet supported Sitting balance-Leahy Scale: Good     Standing balance support: No upper extremity supported, During functional activity Standing balance-Leahy Scale: Poor                             ADL either performed or assessed with clinical judgement   ADL Overall ADL's : Needs assistance/impaired Eating/Feeding: Independent;Sitting   Grooming: Min guard;Standing   Upper Body Bathing: Set up;Sitting   Lower Body Bathing: Min guard;Sit to/from stand   Upper Body Dressing : Set up;Sitting   Lower Body Dressing: Minimal assistance;Sit to/from stand   Toilet Transfer: Min guard;Ambulation;Comfort height toilet   Toileting- Clothing Manipulation and Hygiene: Set up;Sitting/lateral lean   Tub/ Shower Transfer: Min guard;Grab bars;Ambulation;Tub transfer Tub/Shower Transfer Details (indicate cue type and reason): reliance on grab bar; simulated in room with counter top as grab bar Functional mobility during ADLs: Min guard       Vision Baseline Vision/History: 0 No visual deficits Ability to See in Adequate Light: 0 Adequate Patient Visual Report: No change from baseline Vision Assessment?: No apparent visual deficits     Perception     Praxis      Pertinent Vitals/Pain Pain Assessment Pain  Assessment: No/denies pain     Hand Dominance Right   Extremity/Trunk Assessment Upper Extremity Assessment Upper Extremity Assessment: Generalized weakness (4/5 grossly BUE)   Lower  Extremity Assessment Lower Extremity Assessment: Defer to PT evaluation       Communication Communication Communication: No difficulties   Cognition Arousal/Alertness: Awake/alert Behavior During Therapy: WFL for tasks assessed/performed Overall Cognitive Status: Impaired/Different from baseline Area of Impairment: Memory, Orientation, Following commands, Safety/judgement, Awareness, Problem solving                 Orientation Level: Disoriented to, Time, Situation (month)   Memory: Decreased short-term memory Following Commands: Follows one step commands consistently, Follows one step commands with increased time Safety/Judgement: Decreased awareness of safety, Decreased awareness of deficits Awareness: Emergent, Intellectual Problem Solving: Slow processing General Comments: pt has decreased safety awareness, repoting it was may and then April. Daughter present reports that pt seems "foggy"     General Comments  daughter present and reporting cognition is off. Pt reports his balance is normally better than it feels today    Exercises     Shoulder Instructions      Home Living Family/patient expects to be discharged to:: Private residence Living Arrangements: Spouse/significant other Available Help at Discharge: Family;Available 24 hours/day Type of Home: House Home Access: Stairs to enter [employer] of Steps: 5 Entrance Stairs-Rails: Right Home Layout: Two level;Able to live on main level with bedroom/bathroom     Bathroom Shower/Tub: Teacher, early years/pre: Standard Bathroom Accessibility: Yes How Accessible: Accessible via walker Home Equipment: [place] (2 wheels);Wheelchair - manual;Grab bars - tub/shower;Hand held shower head;BSC/3in1          Prior Functioning/Environment Prior Level of Function : Independent/Modified Independent             Mobility Comments: ambulates without AD, denies falls. ADLs Comments:  independent ADLs, limited IADLs, spouse drives and helps pt with socks. Wife assists with dishwashing        OT Problem List: Impaired balance (sitting and/or standing);Decreased activity tolerance;Decreased strength;Decreased knowledge of use of DME or AE;Decreased safety awareness      OT Treatment/Interventions: Self-care/ADL training;Therapeutic exercise;Balance training;Patient/family education;Therapeutic activities;DME and/or AE instruction    OT Goals(Current goals can be found in the care plan section) Acute Rehab OT Goals Patient Stated Goal: go home OT Goal Formulation: With patient Time For Goal Achievement: 08/27/22 Potential to Achieve Goals: Good  OT Frequency: Min 2X/week    Co-evaluation              AM-PAC OT "6 Clicks" Daily Activity     Outcome Measure Help from another person eating meals?: None Help from another person taking care of personal grooming?: A Little Help from another person toileting, which includes using toliet, bedpan, or urinal?: A Little Help from another person bathing (including washing, rinsing, drying)?: A Little Help from another person to put on and taking off regular upper body clothing?: A Little Help from another person to put on and taking off regular lower body clothing?: A Little 6 Click Score: 19   End of Session Equipment Utilized During Treatment: Gait belt Nurse Communication: Mobility status  Activity Tolerance: Patient tolerated treatment well Patient left: in chair;with call bell/phone within reach;with family/visitor present;with chair alarm set  OT Visit Diagnosis: Unsteadiness on feet (R26.81);Muscle weakness (generalized) (M62.81);Other symptoms and signs involving cognitive function                Time: [id number]  OT Time Calculation (min): 22 min Charges:  OT General Charges $OT Visit: 1 Visit OT Evaluation $OT Eval Low Complexity: 1 Low  Elder Cyphers, OTR/L [facility] Acute Rehabilitation Office:  [phone]   Magnus Ivan 08/13/2022, 3:16 PM

## 2022-08-13 NOTE — Evaluation (Addendum)
Physical Therapy Evaluation Patient Details Name: Albert Harris MRN: [id number] DOB: December 19, 1955 Today's Date: 08/13/2022  History of Present Illness  Pt presents to ER on 3/6 for abdominal pain, nausea, vomiting diarrhea. Recently hspitalized fro c-diff colitis and recent angioplasty and stenting of L external iliac on 3/4. PMH: ESRD, HfrEF, liver cirrhosis, COPD, CVA, PAD, RLE lymphedema.  Clinical Impression  Pt is slightly below baseline. He reports no recent falls. Pt is at a high risk for falls currently with gait staggering R/L with LOB and quick stepping for momentum to prevent forward falls. Pt was highly encouraged to use AD but pt declines. Currently recommending HHPT on discharge from acute care hospital setting due to pt is at a high risk for falls, injury and re-hospitalization. Pt Lives with spouse who is home 24/7. Pt is mostly limited due to balance deficits but strength wise is able to perform all functional activities. Due to pt current functional status, PLOF and assistance at home recommending low frequency PT while pt is hospitalized in order to decrease risk for further debilitation.      Recommendations for follow up therapy are one component of a multi-disciplinary discharge planning process, led by the attending physician.  Recommendations may be updated based on patient status, additional functional criteria and insurance authorization.  Follow Up Recommendations Home Health PT      Assistance Recommended at Discharge PRN  Patient can return home with the following  Assistance with cooking/housework;Assist for transportation;Help with stairs or ramp for entrance;A little help with walking and/or transfers    Equipment Recommendations None recommended by PT  Recommendations for Other Services       Functional Status Assessment Patient has had a recent decline in their functional status and demonstrates the ability to make significant improvements in function in a  reasonable and predictable amount of time.     Precautions / Restrictions Precautions Precautions: Fall (High) Restrictions Weight Bearing Restrictions: No      Mobility  Bed Mobility Overal bed mobility: Modified Independent             General bed mobility comments: HOB flat and minimal use of bed rails. Patient Response: Cooperative  Transfers Overall transfer level: Needs assistance Equipment used: None Transfers: Sit to/from Stand Sit to Stand: Min assist           General transfer comment: Min A to stabilize pt on standing. Wide BOS with multi directional sway    Ambulation/Gait Ambulation/Gait assistance: Min assist Gait Distance (Feet): 200 Feet Assistive device: None Gait Pattern/deviations: Step-through pattern, Decreased stride length, Drifts right/left, Staggering right, Staggering left Gait velocity: Quick/slow Gait velocity interpretation: >2.62 ft/sec, indicative of community ambulatory   General Gait Details: Min A to prevent falls 3x during gait. Pt was staggering and hitting his hand on the walls. Pt would walk quickly to prevent anterior fall  Stairs Stairs: Yes Stairs assistance: Min guard Stair Management: One rail Right Number of Stairs: 5 General stair comments: min Guard with step to gait pattern on descending and reciprocal gait pattern on ascending.      Balance Overall balance assessment: Needs assistance Sitting-balance support: No upper extremity supported, Feet supported Sitting balance-Leahy Scale: Good     Standing balance support: No upper extremity supported, During functional activity Standing balance-Leahy Scale: Poor Standing balance comment: Min A 3x during gait in order to prevent fall.       Pertinent Vitals/Pain Pain Assessment Pain Assessment: No/denies pain    Home Living  Family/patient expects to be discharged to:: Private residence Living Arrangements: Spouse/significant other Available Help at  Discharge: Family;Available 24 hours/day Type of Home: House Home Access: Stairs to enter Entrance Stairs-Rails: Right Entrance Stairs-Number of Steps: 5   Home Layout: Two level;Able to live on main level with bedroom/bathroom Home Equipment: Rolling Walker (2 wheels);Wheelchair - manual;Grab bars - tub/shower;Hand held shower head;BSC/3in1      Prior Function Prior Level of Function : Independent/Modified Independent             Mobility Comments: ambulates without AD, denies falls. ADLs Comments: independent ADLs, limited IADLs, spouse drives and helps pt with socks.     Hand Dominance   Dominant Hand: Right    Extremity/Trunk Assessment   Upper Extremity Assessment Upper Extremity Assessment: Defer to OT evaluation    Lower Extremity Assessment Lower Extremity Assessment: Overall WFL for tasks assessed       Communication   Communication: No difficulties  Cognition Arousal/Alertness: Awake/alert Behavior During Therapy: WFL for tasks assessed/performed Overall Cognitive Status: Within Functional Limits for tasks assessed   General Comments: pt has decreased safety awareness.        General Comments General comments (skin integrity, edema, etc.): pt daughter present throughout session.        Assessment/Plan    PT Assessment Patient needs continued PT services  PT Problem List Decreased strength;Decreased activity tolerance;Decreased mobility;Decreased balance       PT Treatment Interventions DME instruction;Gait training;Functional mobility training;Stair training;Therapeutic activities;Therapeutic exercise;Balance training;Neuromuscular re-education;Patient/family education    PT Goals (Current goals can be found in the Care Plan section)  Acute Rehab PT Goals Patient Stated Goal: Pt wants to get better and go home. PT Goal Formulation: With patient Time For Goal Achievement: 08/27/22 Potential to Achieve Goals: Fair    Frequency Min 2X/week      AM-PAC PT "6 Clicks" Mobility  Outcome Measure Help needed turning from your back to your side while in a flat bed without using bedrails?: None Help needed moving from lying on your back to sitting on the side of a flat bed without using bedrails?: None Help needed moving to and from a bed to a chair (including a wheelchair)?: A Little Help needed standing up from a chair using your arms (e.g., wheelchair or bedside chair)?: A Little Help needed to walk in hospital room?: A Little Help needed climbing 3-5 steps with a railing? : A Little 6 Click Score: 20    End of Session   Activity Tolerance: Patient tolerated treatment well Patient left: in bed;with call bell/phone within reach;with bed alarm set Nurse Communication: Mobility status PT Visit Diagnosis: Unsteadiness on feet (R26.81);Other abnormalities of gait and mobility (R26.89);Muscle weakness (generalized) (M62.81);History of falling (Z91.81);Difficulty in walking, not elsewhere classified (R26.2)    Time: [id number] PT Time Calculation (min) (ACUTE ONLY): 20 min   Charges:   PT Evaluation $PT Eval Low Complexity: [place], DPT, CLT  Acute Rehabilitation Services Office: [phone] (Secure chat preferred)   Ander Purpura 08/13/2022, 1:02 PM

## 2022-08-13 NOTE — Consult Note (Signed)
Renal Service Consult Note [facility]  Albert Harris 08/13/2022 Sol Blazing, MD Requesting Physician: Dr. McDiarmid  Reason for Consult: ESRD pt admitted for GI infection HPI: The patient is a 67 y.o. year-old w/ PMH as below who presented w/ N/V and diarrhea. Patient had a perc vascular procedure on 3/04 w/ stenting of the L ext iliac artery. Dc'd on asa/ plavix. Then developed N/V and diarrhea, +hx of Cdif. In ED labs were neg for Cdif but + for norovirus. CT scan showed RP hematoma, likely related to recent procedure. Pt was admitted and started on flagyl and cipro. We are asked to see for dialysis.   Pt seen in room. States he is having SOB lying down at night, but no problems right now. I looked at the app for OP HD unit and he has been coming in 6-12 kg over for the last 2 wks. UF is max 2.5-3 L. Dtr states he heard that the pt has been "drinking a lot of sodas" here lately and that this may explain his weight gains at OP HD.   ROS - denies CP, no joint pain, no HA, no blurry vision, no rash, no dysuria, no difficulty voiding   Past Medical History  Past Medical History:  Diagnosis Date   Acute metabolic encephalopathy [id number]   Acute on chronic diastolic CHF (congestive heart failure) ([place]) 12/15/2020   Acute respiratory disease due to COVID-19 virus 06/16/2020   Alcohol use disorder 06/30/2022   Anemia    Asthma    Bacteremia due to Gram-positive bacteria 05/09/2021   Carotid stenosis 09/12/2019   CHF (congestive heart failure) ([place])    Claudication in peripheral vascular disease ([place]) [id number]   Complication of anesthesia    " i HAVE A HARD TIME WAKING UP " (only one time)   COPD (chronic obstructive pulmonary disease) ([place])    COVID-19    positive on  06/16/20   Critical limb ischemia of right lower extremity ([place])    ESRD on hemodialysis ([place])    t, th. sat dialysis   GERD (gastroesophageal reflux disease)    HLD (hyperlipidemia)     Hypertension    Hypertensive urgency 12/24/2016   Lower GI bleed    Nausea vomiting and diarrhea 08/13/2022   Peripheral vascular disease (HCC)    PFO with atrial septal aneurysm    by echo 11/2017   Pneumonia    Stroke [facility])    Surgical wound infection 09/20/2021   [name] (transient ischemic attack) 11/2017   Tobacco abuse    quit 07/17/19   Volume overload 12/14/2020   Past Surgical History  Past Surgical History:  Procedure Laterality Date   ABDOMINAL AORTOGRAM W/LOWER EXTREMITY N/A 06/21/2020   Procedure: ABDOMINAL AORTOGRAM W/LOWER EXTREMITY;  Surgeon: Angelia Mould, MD;  Location: [place] CV LAB;  Service: Cardiovascular;  Laterality: N/A;   ABDOMINAL AORTOGRAM W/LOWER EXTREMITY Right 08/29/2021   Procedure: ABDOMINAL AORTOGRAM W/LOWER EXTREMITY;  Surgeon: Cherre Robins, MD;  Location: [place] CV LAB;  Service: Cardiovascular;  Laterality: Right;   ABDOMINAL AORTOGRAM W/LOWER EXTREMITY N/A 08/10/2022   Procedure: ABDOMINAL AORTOGRAM W/LOWER EXTREMITY;  Surgeon: Angelia Mould, MD;  Location: [place] CV LAB;  Service: Cardiovascular;  Laterality: N/A;   APPLICATION OF WOUND VAC Right 09/21/2021   Procedure: APPLICATION OF WOUND VAC;  Surgeon: Angelia Mould, MD;  Location: [place];  Service: Vascular;  Laterality: Right;   AV FISTULA PLACEMENT Left 03/28/2020   Procedure:  ARTERIOVENOUS (AV) FISTULA CREATION LEFT;  Surgeon: [name], MD;  Location: [place];  Service: Vascular;  Laterality: Left;   AV FISTULA PLACEMENT Right 09/23/2020   Procedure: RIGHT Arm ARTERIOVENOUS GRAFT CREATION.;  Surgeon: Angelia Mould, MD;  Location: [place];  Service: Vascular;  Laterality: Right;   COLONOSCOPY     COLONOSCOPY WITH PROPOFOL N/A 06/27/2020   Procedure: COLONOSCOPY WITH PROPOFOL;  Surgeon: Carol Ada, MD;  Location: [place];  Service: Endoscopy;  Laterality: N/A;   DRAINAGE AND CLOSURE OF LYMPHOCELE Right 09/21/2021   Procedure: EVACUATION  OF LYMPHOCELE RIGHT GROIN;  Surgeon: Angelia Mould, MD;  Location: [place];  Service: Vascular;  Laterality: Right;   ENDARTERECTOMY FEMORAL Right 09/05/2021   Procedure: ENDARTERECTOMY FEMORAL;  Surgeon: Angelia Mould, MD;  Location: [place];  Service: Vascular;  Laterality: Right;   FEMORAL-POPLITEAL BYPASS GRAFT Right 09/05/2021   Procedure: RIGHT FEMORAL-POPLITEAL ARTERY BYPASS WITH Spaphenous Vein;  Surgeon: Angelia Mould, MD;  Location: [place];  Service: Vascular;  Laterality: Right;   [place]  06/27/2020   Procedure: HEMOSTASIS CLIP PLACEMENT;  Surgeon: Carol Ada, MD;  Location: [place];  Service: Endoscopy;;   INSERTION OF DIALYSIS CATHETER Right 06/24/2020   Procedure: INSERTION OF TUNNELED  DIALYSIS CATHETER; Removal of temporary dialysis catheter right neck;  Surgeon: Angelia Mould, MD;  Location: [place];  Service: Vascular;  Laterality: Right;   IR PARACENTESIS  06/30/2022   LIGATION OF ARTERIOVENOUS  FISTULA Left 07/24/2020   Procedure: LIGATION OF LEFT BRACHIOCEPHALIC ARTERIOVENOUS  FISTULA;  Surgeon: [name], MD;  Location: [place];  Service: Vascular;  Laterality: Left;   PERIPHERAL VASCULAR INTERVENTION Right 06/21/2020   Procedure: PERIPHERAL VASCULAR INTERVENTION;  Surgeon: Angelia Mould, MD;  Location: [place] CV LAB;  Service: Cardiovascular;  Laterality: Right;   PERIPHERAL VASCULAR INTERVENTION Left 08/10/2022   Procedure: PERIPHERAL VASCULAR INTERVENTION;  Surgeon: Angelia Mould, MD;  Location: [place] CV LAB;  Service: Cardiovascular;  Laterality: Left;  External Illiac   TEE WITHOUT CARDIOVERSION N/A 05/13/2021   Procedure: TRANSESOPHAGEAL ECHOCARDIOGRAM (TEE);  Surgeon: Donato Heinz, MD;  Location: [facility] ENDOSCOPY;  Service: Cardiovascular;  Laterality: N/A;   VEIN HARVEST Right 09/05/2021   Procedure: RIGHT Saphenous VEIN HARVEST;  Surgeon: Angelia Mould, MD;  Location: [facility] OR;  Service: Vascular;  Laterality: Right;   Family History  Family History  Problem Relation Age of Onset   Hypertension Mother    Colon cancer Father    Stomach cancer Brother    Social History  reports that he quit smoking about 3 years ago. His smoking use included cigarettes. He has a 50.00 pack-year smoking history. He has never used smokeless tobacco. He reports that he does not currently use alcohol after a past usage of about 1.0 - 3.0 standard drink of alcohol per week. He reports that he does not use drugs. Allergies  Allergies  Allergen Reactions   Lidocaine-Prilocaine Rash    Caused red marks up and down arm   Home medications Prior to Admission medications   Medication Sig Start Date End Date Taking? Authorizing Provider  albuterol (VENTOLIN HFA) 108 (90 Base) MCG/ACT inhaler INHALE 1-2 PUFFS INTO THE LUNGS EVERY 6 (SIX) HOURS AS NEEDED FOR WHEEZING OR SHORTNESS OF BREATH. 07/02/20 07/22/22  Maness, Arnette Norris, MD  amLODipine (NORVASC) 10 MG tablet Take 1 tablet (10 mg total) by mouth daily. 05/07/22   Oletta Lamas,  Milford Cage, NP  aspirin EC 81 MG tablet Take 1 tablet (81 mg total) by mouth daily. Swallow whole. 08/10/22 08/10/23  Angelia Mould, MD  B Complex-C-Zn-Folic Acid (DIALYVITE/ZINC) TABS Take 1 tablet by mouth daily. 04/25/22   [provider]  calcium acetate (PHOSLO) 667 MG capsule Take 2 capsule by mouth three times a day with meals Patient taking differently: Take 1,334 mg by mouth 3 (three) times daily with meals. 06/17/21     clopidogrel (PLAVIX) 75 MG tablet Take 1 tablet (75 mg total) by mouth daily. 08/10/22   Angelia Mould, MD  metoprolol succinate (TOPROL-XL) 25 MG 24 hr tablet Take 1 tablet (25 mg total) by mouth daily. 07/01/22   Precious Gilding, DO  rosuvastatin (CRESTOR) 10 MG tablet Take 1 tablet (10 mg total) by mouth daily. 08/10/22 08/10/23  Angelia Mould, MD  sacubitril-valsartan (ENTRESTO) 24-26 MG Take 1 tablet  by mouth 2 (two) times daily. 07/01/22   Precious Gilding, DO     Vitals:   08/13/22 0543 08/13/22 0559 08/13/22 [id number] 08/13/22 0940  BP: 134/86  134/76 (!) 149/83  Pulse: 81  67 69  Resp: '18  18 18  '$ Temp: 98 F (36.7 C)  97.9 F (36.6 C) 97.6 F (36.4 C)  TempSrc: Oral  Oral Oral  SpO2: 92%  91% 94%  Weight:  72 kg     Exam Gen alert, no distress No rash, cyanosis or gangrene Sclera anicteric, throat clear  No jvd or bruits Chest clear bilat to bases, no rales/ wheezing RRR no MRG Abd soft ntnd no mass or ascites +bs GU normal male MS no joint effusions or deformity Ext mild 1+ pretib edema, no wounds or ulcers Neuro is alert, Ox 3 , nf    RFA AVG+bruit    Home meds include - norvasc 10, phoslo 2 ac, toprol xl 25 qd, crestor, entresto bid, asa, plavix, prns/ vits/ supps     OP HD: TTS AF 4h  350/800  64.5kg  3k/2.5Ca bath  RFA AVG  Heparin none - last HD 3/02, post wt 70.8kg - has had climbing post hd wts all HD sessions for the last 2 wks - rocaltrol 0.25 mcg po tiw - venofer '50mg'$  weekly - mircera 50 mcg IV q 2 wks, last 2/29, due 3/14   Assessment/ Plan: N/V / diarrhea - +for norovirus on GI panel Vol overload - early edema on CXR, 8kg over today. Has been coming in 6- 10kg over dry wt the last 2 weeks at OP unit. Will need HD tonight and possibly again tomorrow.  ESRD - on HD TTS. Missed 3/05 HD. HD tonight as above.  HTN/ volume - as above, sig vol overloaded, having SOB at night lying down. 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 Follow.  MBD ckd - CCa and phos are in range. Cont binder and po vdra.  PAD - has procedure 3/04 per [facility] for new L ext iliac stenting for ischemic LLE pain.       Kelly Splinter  MD [facility] 08/13/2022, 2:51 PM  Recent Labs  Lab 08/12/22 1501 08/12/22 1520 08/13/22 0359  HGB  --  8.5* 7.5*  ALBUMIN 3.5  --  2.9*  CALCIUM 9.3  --  8.7*  PHOS  --   --  6.4*  CREATININE 10.75*  --  11.63*  K 5.0  --  4.9    Inpatient medications:  clopidogrel  75  mg Oral Daily   pantoprazole  40 mg Oral BID    ciprofloxacin     metronidazole 500 mg (08/13/22 1020)   acetaminophen **OR** acetaminophen, albuterol, ondansetron **OR** ondansetron (ZOFRAN) IV

## 2022-08-13 NOTE — Progress Notes (Signed)
Daily Progress Note Intern Pager: [phone]  Patient name: Albert Harris Medical record number: [id number] Date of birth: 1955/09/23 Age: 67 y.o. Gender: male  Primary Care Provider: Kerin Perna, NP Consultants: Nephrology, VVS Code Status: Full code  Pt Overview and Major Events to Date:  3/6: Admitted to FMTS  Assessment and Plan: Albert Harris is a 67 y.o. male presenting with abdominal pain N/V/D.  PMHx includes ESRD (TTS), HFrEF, cirrhosis, COPD, h/o CVA and PAD.  * Colitis C diff negative but GI pathogen panel positive for Norovirus. H/o C diff x2 in recent month but will not consult ID at this time given Norovirus result. -D/c antibiotics Flagyl and Cipro given viral origin -Supportive management as needed -Consider fluids given NPO with GI losses  Retroperitoneal hematoma Hgb dropped from 10.5 upon admission to 7.5 upon repeat. Given 1 unit pRBC. Denies blood in stool or melena. S/p stent placement on 3/4 -Vascular surgery consulted, pending updated recommendations -Cont ASA and Plavix per vascular surgery to prevent stent complications -F/u post transfusion H&H -NPO for potential procedure  ESRD on dialysis [facility]) HD planned for today, patient on TTS schedule. - Nephrology following, appreciate recs  Acute respiratory failure with hypoxia ([place]) Now on RA, denies SOB. CT showed trace pleural effusions likely in the setting of missed HD.  HFrEF (heart failure with reduced ejection fraction) ([place]) Does not appear grossly hypervolemic upon exam. Suspect volume overload secondary to missed dialysis rather than acute CHF exacerbation.  -Holding home HF meds as above -Plan for HD for fluid management  Hypertension Normotensive. -Holding home medications: Entresto, Toprol, Amlodipine   FEN/GI: NPO pending potential procedure PPx: On ASA, Plaix for stent, Lovenox held for potential bleed Dispo: Pending additional workup and clinical  improvement  Subjective:  Patient assessed at bedside, reports he is still have watery diarrhea. Taking ice chips and fluids without N/V. Endorses some belly pain and feeling of fullness.  Objective: Temp:  [97.6 F (36.4 C)-101 F (38.3 C)] 97.6 F (36.4 C) (03/07 0940) Pulse Rate:  [67-100] 69 (03/07 0940) Resp:  [17-38] 18 (03/07 0940) BP: (101-167)/(54-88) 149/83 (03/07 0940) SpO2:  [85 %-100 %] 94 % (03/07 0940) Weight:  [72 kg] 72 kg (03/07 0559) Physical Exam: General: Laying in bed, NAD Cardiovascular: RRR, without murmur Respiratory: CTBA. Normal WOB on RA Abdomen: Soft, moderate distension, mildly tender to palpation Extremities: Mild peripheral edema on R leg compared to L  Laboratory: Most recent CBC Lab Results  Component Value Date   WBC 16.7 (H) 08/13/2022   HGB 7.5 (L) 08/13/2022   HCT 23.3 (L) 08/13/2022   MCV 105.4 (H) 08/13/2022   PLT 121 (L) 08/13/2022   Most recent BMP    Latest Ref Rng & Units 08/13/2022    3:59 AM  BMP  Glucose 70 - 99 mg/dL 114   BUN 8 - 23 mg/dL 48   Creatinine 0.61 - 1.24 mg/dL 11.63   Sodium 135 - 145 mmol/L 135   Potassium 3.5 - 5.1 mmol/L 4.9   Chloride 98 - 111 mmol/L 95   CO2 22 - 32 mmol/L 24   Calcium 8.9 - 10.3 mg/dL 8.7     Other pertinent labs: FOBT: neg C diff: neg Quad: neg  Imaging/Diagnostic Tests: CT ABDOMEN PELVIS W CONTRAST Result Date: 08/12/2022 IMPRESSION: 1. Small volume retroperitoneal fluid within the left hemipelvis measuring greater than fluid density, suggestive of a retroperitoneal hematoma of indeterminate etiology. 2. Long segment mild circumferential  wall thickening of the sigmoid colon, and to a lesser degree involving the descending colon. Findings are suggestive of an infectious or inflammatory colitis. 3. Trace perihepatic ascites. 4. Trace bilateral pleural effusions. 5. Severe aortoiliac atherosclerosis (ICD10-I70.0).   DG Chest Port 1 View Result Date: 08/12/2022 IMPRESSION: 1.  Unchanged cardiomegaly with prominent bilateral interstitial opacities, which could represent pulmonary venous congestion or pulmonary edema. 2. No focal airspace opacity.   Colletta Maryland, MD 08/13/2022, 1:19 PM  PGY-1, [place] Intern pager: [phone], text pages welcome Secure chat group [place]

## 2022-08-13 NOTE — Consult Note (Signed)
VASCULAR AND VEIN SPECIALISTS OF [place]  ASSESSMENT / PLAN: 67 y.o. male with small retroperitoneal hematoma after percutaneous intervention. Recommend conservative care only. Monitor CBC. Continue ASA / Plavix. Will follow with you.   CHIEF COMPLAINT: RP hematoma on CT scan  HISTORY OF PRESENT ILLNESS: Albert Harris is a 67 y.o. male admitted to the medicine service for colitis. He recently underwent percutaneous intervention with Dr. Scot Dock for iliac stenosis. On my evaluation earlier today, the patient reports no symptoms in his feet. His feet feel much better after intervention. I reviewed the CT findings and explained my recommended treatment plan.   Past Medical History:  Diagnosis Date   Acute metabolic encephalopathy [id number]   Acute on chronic diastolic CHF (congestive heart failure) ([place]) 12/15/2020   Acute respiratory disease due to COVID-19 virus 06/16/2020   Alcohol use disorder 06/30/2022   Anemia    Asthma    Bacteremia due to Gram-positive bacteria 05/09/2021   Carotid stenosis 09/12/2019   CHF (congestive heart failure) (HCC)    Claudication in peripheral vascular disease ([place]) [id number]   Complication of anesthesia    " i HAVE A HARD TIME WAKING UP " (only one time)   COPD (chronic obstructive pulmonary disease) ([place])    COVID-19    positive on  06/16/20   Critical limb ischemia of right lower extremity (HCC)    ESRD on hemodialysis ([place])    t, th. sat dialysis   GERD (gastroesophageal reflux disease)    HLD (hyperlipidemia)    Hypertension    Hypertensive urgency 12/24/2016   Lower GI bleed    Peripheral vascular disease (HCC)    PFO with atrial septal aneurysm    by echo 11/2017   Pneumonia    Stroke [facility])    Surgical wound infection 09/20/2021   [name] (transient ischemic attack) 11/2017   Tobacco abuse    quit 07/17/19   Volume overload 12/14/2020    Past Surgical History:  Procedure Laterality Date   ABDOMINAL AORTOGRAM W/LOWER EXTREMITY N/A  06/21/2020   Procedure: ABDOMINAL AORTOGRAM W/LOWER EXTREMITY;  Surgeon: Angelia Mould, MD;  Location: [place] [facility];  Service: Cardiovascular;  Laterality: N/A;   ABDOMINAL AORTOGRAM W/LOWER EXTREMITY Right 08/29/2021   Procedure: ABDOMINAL AORTOGRAM W/LOWER EXTREMITY;  Surgeon: Cherre Robins, MD;  Location: [place] [facility];  Service: Cardiovascular;  Laterality: Right;   ABDOMINAL AORTOGRAM W/LOWER EXTREMITY N/A 08/10/2022   Procedure: ABDOMINAL AORTOGRAM W/LOWER EXTREMITY;  Surgeon: Angelia Mould, MD;  Location: [place] [facility];  Service: Cardiovascular;  Laterality: N/A;   APPLICATION OF WOUND VAC Right 09/21/2021   Procedure: APPLICATION OF WOUND VAC;  Surgeon: Angelia Mould, MD;  Location: [facility];  Service: Vascular;  Laterality: Right;   AV FISTULA PLACEMENT Left 03/28/2020   Procedure: ARTERIOVENOUS (AV) FISTULA CREATION LEFT;  Surgeon: [name], MD;  Location: [place];  Service: Vascular;  Laterality: Left;   AV FISTULA PLACEMENT Right 09/23/2020   Procedure: RIGHT Arm ARTERIOVENOUS GRAFT CREATION.;  Surgeon: Angelia Mould, MD;  Location: [place];  Service: Vascular;  Laterality: Right;   COLONOSCOPY     COLONOSCOPY WITH PROPOFOL N/A 06/27/2020   Procedure: COLONOSCOPY WITH PROPOFOL;  Surgeon: Carol Ada, MD;  Location: [place];  Service: Endoscopy;  Laterality: N/A;   DRAINAGE AND CLOSURE OF LYMPHOCELE Right 09/21/2021   Procedure: EVACUATION OF LYMPHOCELE RIGHT GROIN;  Surgeon: Angelia Mould, MD;  Location: [place];  Service: Vascular;  Laterality: Right;  ENDARTERECTOMY FEMORAL Right 09/05/2021   Procedure: ENDARTERECTOMY FEMORAL;  Surgeon: Angelia Mould, MD;  Location: [facility];  Service: Vascular;  Laterality: Right;   FEMORAL-POPLITEAL BYPASS GRAFT Right 09/05/2021   Procedure: RIGHT FEMORAL-POPLITEAL ARTERY BYPASS WITH Spaphenous Vein;  Surgeon: Angelia Mould, MD;  Location: [facility];  Service: Vascular;   Laterality: Right;   [place]  06/27/2020   Procedure: HEMOSTASIS CLIP PLACEMENT;  Surgeon: Carol Ada, MD;  Location: [place];  Service: Endoscopy;;   INSERTION OF DIALYSIS CATHETER Right 06/24/2020   Procedure: INSERTION OF TUNNELED  DIALYSIS CATHETER; Removal of temporary dialysis catheter right neck;  Surgeon: Angelia Mould, MD;  Location: [place];  Service: Vascular;  Laterality: Right;   IR PARACENTESIS  06/30/2022   LIGATION OF ARTERIOVENOUS  FISTULA Left 07/24/2020   Procedure: LIGATION OF LEFT BRACHIOCEPHALIC ARTERIOVENOUS  FISTULA;  Surgeon: [name], MD;  Location: [facility];  Service: Vascular;  Laterality: Left;   PERIPHERAL VASCULAR INTERVENTION Right 06/21/2020   Procedure: PERIPHERAL VASCULAR INTERVENTION;  Surgeon: Angelia Mould, MD;  Location: [place] [facility];  Service: Cardiovascular;  Laterality: Right;   PERIPHERAL VASCULAR INTERVENTION Left 08/10/2022   Procedure: PERIPHERAL VASCULAR INTERVENTION;  Surgeon: Angelia Mould, MD;  Location: [place] [facility];  Service: Cardiovascular;  Laterality: Left;  External Illiac   TEE WITHOUT CARDIOVERSION N/A 05/13/2021   Procedure: TRANSESOPHAGEAL ECHOCARDIOGRAM (TEE);  Surgeon: Donato Heinz, MD;  Location: [facility] ENDOSCOPY;  Service: Cardiovascular;  Laterality: N/A;   VEIN HARVEST Right 09/05/2021   Procedure: RIGHT Saphenous VEIN HARVEST;  Surgeon: Angelia Mould, MD;  Location: [facility] OR;  Service: Vascular;  Laterality: Right;    Family History  Problem Relation Age of Onset   Hypertension Mother    Colon cancer Father    Stomach cancer Brother     Social History   Socioeconomic History   Marital status: Divorced    Spouse name: Not on file   Number of children: Not on file   Years of education: Not on file   Highest education level: Not on file  Occupational History   Not on file  Tobacco Use   Smoking status: Former    Packs/day:  1.00    Years: 50.00    Total pack years: 50.00    Types: Cigarettes    Quit date: 07/17/2019    Years since quitting: 3.0   Smokeless tobacco: Never  Vaping Use   Vaping Use: Never used  Substance and Sexual Activity   Alcohol use: Not Currently    Alcohol/week: 1.0 - 3.0 standard drink of alcohol    Types: 1 - 3 Cans of beer per week   Drug use: No   Sexual activity: Not Currently  Other Topics Concern   Not on file  Social History Narrative   Married   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   Social Determinants of Health   Financial Resource Strain: [place]  (09/12/2019)   Overall Financial Resource Strain (CARDIA)    Difficulty of Paying Living Expenses: Not hard at all  Food Insecurity: Food Insecurity Present (07/23/2022)   Hunger Vital Sign    Worried About Running Out of Food in the Last Year: Sometimes true    Ran Out of Food in the Last Year: Sometimes true  Transportation Needs: Unmet Transportation Needs (07/23/2022)   PRAPARE - Hydrologist (Medical): Yes  Lack of Transportation (Non-Medical): No  Physical Activity: Not on file  Stress: No Stress Concern Present (09/12/2019)   [employer] [place]    Feeling of Stress : Not at all  Social Connections: Not on file  Intimate Partner Violence: Not At Risk (07/23/2022)   Humiliation, Afraid, Rape, and Kick questionnaire    Fear of Current or Ex-Partner: No    Emotionally Abused: No    Physically Abused: No    Sexually Abused: No    Allergies  Allergen Reactions   Lidocaine-Prilocaine Rash    Caused red marks up and down arm    Current Facility-Administered Medications  Medication Dose Route Frequency Provider Last Rate Last Admin   acetaminophen (TYLENOL) tablet 650 mg  650 mg Oral Q6H PRN Darci Current, DO       Or   acetaminophen (TYLENOL) suppository 650 mg  650 mg Rectal Q6H PRN Darci Current, DO       albuterol  (PROVENTIL) (2.5 MG/3ML) 0.083% nebulizer solution 3 mL  3 mL Inhalation Q6H PRN Darci Current, DO       ciprofloxacin (CIPRO) IVPB 400 mg  400 mg Intravenous Q24H Eppie Gibson, MD       clopidogrel (PLAVIX) tablet 75 mg  75 mg Oral Daily Darci Current, DO   75 mg at 08/12/22 2120   metroNIDAZOLE (FLAGYL) IVPB 500 mg  500 mg Intravenous Q12H Jim Like B, MD 100 mL/hr at 08/13/22 1020 500 mg at 08/13/22 1020   ondansetron (ZOFRAN) tablet 4 mg  4 mg Oral Q6H PRN Darci Current, DO       Or   ondansetron (ZOFRAN) injection 4 mg  4 mg Intravenous Q6H PRN Darci Current, DO       pantoprazole (PROTONIX) EC tablet 40 mg  40 mg Oral BID McDiarmid, Blane Ohara, MD        PHYSICAL EXAM Vitals:   08/13/22 0543 08/13/22 0559 08/13/22 [id number] 08/13/22 0940  BP: 134/86  134/76 (!) 149/83  Pulse: 81  67 69  Resp: '18  18 18  '$ Temp: 98 F (36.7 C)  97.9 F (36.6 C) 97.6 F (36.4 C)  TempSrc: Oral  Oral Oral  SpO2: 92%  91% 94%  Weight:  72 kg     Well appearing man in no distress Regular rate and rhythm Unlabored breathing Feet warm and well perfused L groin access site well healed.   PERTINENT LABORATORY AND RADIOLOGIC DATA  Most recent CBC    Latest Ref Rng & Units 08/13/2022    3:59 AM 08/12/2022    3:20 PM 08/10/2022    7:28 AM  CBC  WBC 4.0 - 10.5 K/uL 16.7  17.1    Hemoglobin 13.0 - 17.0 g/dL 7.5  8.5  10.5   Hematocrit 39.0 - 52.0 % 23.3  26.6  31.0   Platelets 150 - 400 K/uL 121  154       Most recent CMP    Latest Ref Rng & Units 08/13/2022    3:59 AM 08/12/2022    3:01 PM 08/10/2022    7:28 AM  CMP  Glucose 70 - 99 mg/dL 114  118  78   BUN 8 - 23 mg/dL 48  43  28   Creatinine 0.61 - 1.24 mg/dL 11.63  10.75  7.20   Sodium 135 - 145 mmol/L 135  138  142   Potassium 3.5 - 5.1 mmol/L 4.9  5.0  4.3  Chloride 98 - 111 mmol/L 95  94  100   CO2 22 - 32 mmol/L 24  26    Calcium 8.9 - 10.3 mg/dL 8.7  9.3    Total Protein 6.5 - 8.1 g/dL  7.7    Total Bilirubin 0.3 - 1.2 mg/dL  1.3     Alkaline Phos 38 - 126 U/L  106    AST 15 - 41 U/L  18    ALT 0 - 44 U/L  8      Renal function Estimated Creatinine Clearance: 5.9 mL/min (A) (by C-G formula based on SCr of 11.63 mg/dL (H)).  Hgb A1c MFr Bld (%)  Date Value  08/29/2021 5.0    LDL Chol Calc (NIH)  Date Value Ref Range Status  11/27/2020 89 0 - 99 mg/dL Final   LDL Cholesterol  Date Value Ref Range Status  09/06/2021 63 0 - 99 mg/dL Final    Comment:           Total Cholesterol/HDL:CHD Risk Coronary Heart Disease Risk Table                     Men   Women  1/2 Average Risk   3.4   3.3  Average Risk       5.0   4.4  2 X Average Risk   9.6   7.1  3 X Average Risk  23.4   11.0        Use the calculated Patient Ratio above and the CHD Risk Table to determine the patient's CHD Risk.        ATP III CLASSIFICATION (LDL):  <100     mg/dL   Optimal  100-129  mg/dL   Near or Above                    Optimal  130-159  mg/dL   Borderline  160-189  mg/dL   High  >190     mg/dL   Very High Performed at [facility] [address], [place], [place] [address]     CT Abdomen / Pelvis: 1. Small volume retroperitoneal fluid within the left hemipelvis measuring greater than fluid density, suggestive of a retroperitoneal hematoma of indeterminate etiology. 2. Long segment mild circumferential wall thickening of the sigmoid colon, and to a lesser degree involving the descending colon. Findings are suggestive of an infectious or inflammatory colitis. 3. Trace perihepatic ascites. 4. Trace bilateral pleural effusions. 5. Severe aortoiliac atherosclerosis (ICD10-I70.0).  Yevonne Aline. Stanford Breed, MD [facility] Vascular and Vein Specialists of [facility] Phone Number: [phone] 08/13/2022 1:39 PM   Total time spent on preparing this encounter including chart review, data review, collecting history, examining the patient, coordinating care for this established patient, 30 minutes.  Portions of this report may  have been transcribed using voice recognition software.  Every effort has been made to ensure accuracy; however, inadvertent computerized transcription errors may still be present.

## 2022-08-13 NOTE — Progress Notes (Signed)
FMTS Interim Progress Note  Spoke with VVS PA on call about CT finding of small retroperitoneal bleed. S/p iliac stent on 3/4, on ASA and Plavix. Per PA, retroperitoneal bleed could be expected in light of recent procedure. Given drop in Hgb from 10.5 to 7.5 today, will hold ASA and continue Plavix given VVS recommendation. PA to review CT images with VVS attending Dr. Virl Cagey and follow up as indicated.   [facility], Dr. Jonnie Finner regarding HD given patient is ESRD (TTS) and already missed Tuesday HD session. Nephro to see patient today.  Received in report from ED that VVS and Nephrology had already been consulted and aware of patient upon admission. Neither service aware of patient upon consultation.   Colletta Maryland, MD 08/13/2022, 1:22 PM PGY-1, [facility] Service pager [phone]

## 2022-08-13 NOTE — Progress Notes (Signed)
   08/13/22 1434  Mobility  Activity Ambulated independently to bathroom;Ambulated with assistance to bathroom  Level of Assistance Standby assist, set-up cues, supervision of patient - no hands on  Assistive Device None  Distance Ambulated (ft) 10 ft  Activity Response Tolerated well  Mobility Referral Yes  $Mobility charge 1 Mobility   Mobility Specialist Progress Note  Pt requesting to use BR for BM. Had no c/o pain. Left in chair w/ alarm on and call bell in reach.   Lucious Groves Mobility Specialist  Please contact via SecureChat or Rehab office at [phone]

## 2022-08-14 LAB — CBC
HCT: 27.1 % — ABNORMAL LOW (ref 39.0–52.0)
Hemoglobin: 9.1 g/dL — ABNORMAL LOW (ref 13.0–17.0)
MCH: 34.1 pg — ABNORMAL HIGH (ref 26.0–34.0)
MCHC: 33.6 g/dL (ref 30.0–36.0)
MCV: 101.5 fL — ABNORMAL HIGH (ref 80.0–100.0)
Platelets: 136 10*3/uL — ABNORMAL LOW (ref 150–400)
RBC: 2.67 MIL/uL — ABNORMAL LOW (ref 4.22–5.81)
RDW: 17.4 % — ABNORMAL HIGH (ref 11.5–15.5)
WBC: 12.7 10*3/uL — ABNORMAL HIGH (ref 4.0–10.5)
nRBC: 0 % (ref 0.0–0.2)

## 2022-08-14 LAB — RENAL FUNCTION PANEL
Albumin: 3 g/dL — ABNORMAL LOW (ref 3.5–5.0)
Anion gap: 15 (ref 5–15)
BUN: 28 mg/dL — ABNORMAL HIGH (ref 8–23)
CO2: 26 mmol/L (ref 22–32)
Calcium: 8.4 mg/dL — ABNORMAL LOW (ref 8.9–10.3)
Chloride: 95 mmol/L — ABNORMAL LOW (ref 98–111)
Creatinine, Ser: 7.15 mg/dL — ABNORMAL HIGH (ref 0.61–1.24)
GFR, Estimated: 8 mL/min — ABNORMAL LOW (ref >60)
Glucose, Bld: 88 mg/dL (ref 70–99)
Phosphorus: 5 mg/dL — ABNORMAL HIGH (ref 2.5–4.6)
Potassium: 3.9 mmol/L (ref 3.5–5.1)
Sodium: 136 mmol/L (ref 135–145)

## 2022-08-14 LAB — TYPE AND SCREEN
ABO/RH(D): O POS
Antibody Screen: NEGATIVE
Unit division: 0

## 2022-08-14 LAB — LACTOFERRIN, FECAL, QUALITATIVE: Lactoferrin, Fecal, Qual: NEGATIVE

## 2022-08-14 LAB — BPAM RBC
Blood Product Expiration Date: 202404032359
ISSUE DATE / TIME: 202403070610
Unit Type and Rh: 5100

## 2022-08-14 LAB — MRSA NEXT GEN BY PCR, NASAL: MRSA by PCR Next Gen: DETECTED — AB

## 2022-08-14 MED ORDER — PENTAFLUOROPROP-TETRAFLUOROETH EX AERO: 1.0000 | INHALATION_SPRAY | CUTANEOUS | Status: DC | PRN

## 2022-08-14 MED ORDER — ENOXAPARIN SODIUM 30 MG/0.3ML IJ SOSY
30.0000 mg | PREFILLED_SYRINGE | INTRAMUSCULAR | Status: DC
  Administered 2022-08-14 – 2022-08-15 (×2): 30 mg via SUBCUTANEOUS
  Filled 2022-08-14 (×2): qty 0.3

## 2022-08-14 MED ORDER — HEPARIN SODIUM (PORCINE) 1000 UNIT/ML DIALYSIS: 1000.0000 [IU] | INTRAMUSCULAR | Status: DC | PRN

## 2022-08-14 MED ORDER — ASPIRIN 81 MG PO TBEC
81.0000 mg | DELAYED_RELEASE_TABLET | Freq: Every day | ORAL | Status: DC
  Administered 2022-08-14 – 2022-08-15 (×2): 81 mg via ORAL
  Filled 2022-08-14 (×2): qty 1

## 2022-08-14 MED ORDER — CHLORHEXIDINE GLUCONATE CLOTH 2 % EX PADS: 6.0000 | MEDICATED_PAD | Freq: Every day | CUTANEOUS | Status: DC

## 2022-08-14 MED ORDER — ALTEPLASE 2 MG IJ SOLR: 2.0000 mg | Freq: Once | INTRAMUSCULAR | Status: DC | PRN

## 2022-08-14 NOTE — Plan of Care (Signed)
  Problem: Education: Goal: Knowledge of General Education information will improve Description: Including pain rating scale, medication(s)/side effects and non-pharmacologic comfort measures 08/14/2022 0107 by Keturah Shavers, RN Outcome: Progressing   Problem: Activity: Goal: Ability to return to baseline activity level will improve Outcome: Progressing   Problem: Health Behavior/Discharge Planning: Goal: Ability to manage health-related needs will improve 08/14/2022 0251 by Keturah Shavers, RN Outcome: Progressing 08/14/2022 0107 by Keturah Shavers, RN Outcome: Progressing   Problem: Clinical Measurements: Goal: Ability to maintain clinical measurements within normal limits will improve 08/14/2022 0251 by Keturah Shavers, RN Outcome: Progressing 08/14/2022 0107 by Keturah Shavers, RN Outcome: Progressing Goal: Will remain free from infection 08/14/2022 0251 by Keturah Shavers, RN Outcome: Progressing 08/14/2022 0107 by Keturah Shavers, RN Outcome: Progressing Goal: Diagnostic test results will improve Outcome: Progressing

## 2022-08-14 NOTE — Progress Notes (Signed)
   08/14/22 1200  Mobility  Activity Ambulated with assistance in hallway  Level of Assistance Contact guard assist, steadying assist  Distance Ambulated (ft) 250 ft  Activity Response Tolerated well  Mobility Referral Yes  $Mobility charge 1 Mobility   Mobility Specialist Progress Note  Pt was in bed and agreeable. Had no c/o pain. Returned to bed w/ all needs met and call bell in reach.  Lucious Groves Mobility Specialist  Please contact via SecureChat or Rehab office at [phone]

## 2022-08-14 NOTE — TOC Initial Note (Signed)
Transition of Care (TOC) - Initial/Assessment Note   Spoke to patient at bedside. Confirmed face sheet information.   Patient from home with wife. Uses access [place] for transport to HD .   Has walker and wheel chair  PT recommending HHPT. Discussed with patient at this time he is declining. If he changes his mind prior to discharge NCM will arrange. If he decides after discharge PCP will arrange.  Patient Details  Name: Albert Harris MRN: [id number] Date of Birth: [birth year]  Transition of Care [facility]) CM/SW Contact:    Marilu Favre, RN Phone Number: 08/14/2022, 10:12 AM  Clinical Narrative:                   Expected Discharge Plan: Home/Self Care Barriers to Discharge: Continued Medical Work up   Patient Goals and CMS Choice Patient states their goals for this hospitalization and ongoing recovery are:: to return to home CMS Medicare.gov Compare Post Acute Care list provided to:: Patient Choice offered to / list presented to : Patient [place] ownership interest in [facility].provided to:: Patient    Expected Discharge Plan and Services   Discharge Planning Services: CM Consult Post Acute Care Choice: [place] arrangements for the past 2 months: Single Family Home                 DME Arranged: N/A         HH Arranged: NA          Prior Living Arrangements/Services Living arrangements for the past 2 months: Single Family Home Lives with:: Spouse Patient language and need for interpreter reviewed:: Yes Do you feel safe going back to the place where you live?: Yes      Need for Family Participation in Patient Care: Yes (Comment) Care giver support system in place?: Yes (comment) Current home services: DME Criminal Activity/Legal Involvement Pertinent to Current Situation/Hospitalization: No - Comment as needed  Activities of Daily Living Home Assistive Devices/Equipment: Eyeglasses ADL Screening (condition at time of  admission) Patient's cognitive ability adequate to safely complete daily activities?: Yes Is the patient deaf or have difficulty hearing?: No Does the patient have difficulty seeing, even when wearing glasses/contacts?: No Does the patient have difficulty concentrating, remembering, or making decisions?: No Patient able to express need for assistance with ADLs?: Yes Does the patient have difficulty dressing or bathing?: No Independently performs ADLs?: Yes (appropriate for developmental age) Does the patient have difficulty walking or climbing stairs?: No Weakness of Legs: None Weakness of Arms/Hands: None  Permission Sought/Granted   Permission granted to share information with : No              Emotional Assessment Appearance:: Appears stated age Attitude/Demeanor/Rapport: Engaged Affect (typically observed): Accepting Orientation: : Oriented to Self, Oriented to Place, Oriented to  Time, Oriented to Situation Alcohol / Substance Use: Not Applicable Psych Involvement: No (comment)  Admission diagnosis:  Colitis [K52.9] Patient Active Problem List   Diagnosis Date Noted   Nucleic acid assay by PCR positive for norovirus 08/13/2022   Nausea vomiting and diarrhea 08/13/2022   Colitis 08/12/2022   Retroperitoneal hematoma 08/12/2022   C. difficile colitis 07/22/2022   Alcohol use disorder 06/30/2022   Clostridium difficile diarrhea 06/30/2022   Other ascites [id number]   Acute systolic congestive heart failure ([place]) 06/30/2022   ESRD on dialysis ([place]) 06/28/2022   Spinal stenosis of cervical region 06/28/2022   Diarrhea 06/28/2022   Cirrhosis of liver ([place]) 06/28/2022  Lymphedema 06/28/2022   HFrEF (heart failure with reduced ejection fraction) ([place]) 06/28/2022   PAD (peripheral artery disease) ([place]) 09/05/2021   Thrombocytopenia ([place]) 05/05/2021   Goals of care, counseling/discussion    Hypertension    COPD (chronic obstructive pulmonary disease) ([place])     Claudication in peripheral vascular disease ([place]) 09/12/2019   Carotid stenosis 09/12/2019   PFO (patent foramen ovale) 08/10/2019   Noncompliance 08/10/2019   Hx of transient ischemic attack ([name]) 08/10/2019   Chronic diastolic heart failure (HCC)    Elevated troponin    Hypertensive emergency    Acute pulmonary edema ([place]) 07/18/2019   ARF (acute renal failure) ([place]) 07/18/2019   Macrocytic anemia 07/18/2019   ESRD (end stage renal disease) ([place]) 11/09/2017   PCP:  Kerin Perna, NP Pharmacy:   [facility] [place], [place] - [address] AT [address] [place] [address] Phone: [phone] Fax: [phone]     Social Determinants of Health (SDOH) Social History: SDOH Screenings   Food Insecurity: Food Insecurity Present (07/23/2022)  Housing: Low Risk  (07/23/2022)  Transportation Needs: Unmet Transportation Needs (07/23/2022)  Utilities: Not At Risk (07/23/2022)  Alcohol Screen: Low Risk  (09/12/2019)  Depression (PHQ2-9): High Risk (05/07/2022)  Financial Resource Strain: Low Risk  (09/12/2019)  Stress: No Stress Concern Present (09/12/2019)  Tobacco Use: Medium Risk (08/13/2022)   SDOH Interventions:     Readmission Risk Interventions    08/14/2022   10:07 AM 07/24/2022   11:52 AM 05/07/2021    2:27 PM  Readmission Risk Prevention Plan  Transportation Screening Complete Complete Complete  Medication Review (RN Care Manager) Referral to Pharmacy Complete Complete  PCP or Specialist appointment within 3-5 days of discharge Complete Complete Complete  HRI or Home Care Consult Patient refused Complete Complete  SW Recovery Care/Counseling Consult Complete Patient refused Patient refused  Palliative Care Screening Not Applicable Not Applicable Not [place] Not Applicable Not Applicable Not Applicable

## 2022-08-14 NOTE — Progress Notes (Signed)
Received patient in bed to unit.  Alert and oriented.  Informed consent signed and in chart.   TX duration: 3.50  Patient tolerated well.  Transported back to the room  Alert, without acute distress.  Hand-off given to patient's nurse.   Access used: graft Access issues: none  Total UF removed: 3500 mls Medication(s) given: none Post HD VS: 132/57 Post HD weight: 69.2 kg     08/14/22 0516  Vitals  Temp 98.2 F (36.8 C)  Temp Source Oral  BP (!) 132/54  MAP (mmHg) 77  BP Location Left Arm  BP Method Automatic  Patient Position (if appropriate) Lying  Pulse Rate 79  Pulse Rate Source Monitor  ECG Heart Rate 79  Resp (!) 22  Oxygen Therapy  SpO2 94 %  O2 Device Room Air  Patient Activity (if Appropriate) In bed  Pulse Oximetry Type Continuous  Post Treatment  Dialyzer Clearance Lightly streaked  Duration of HD Treatment -hour(s) 3.5 hour(s)  Hemodialysis Intake (mL) 0 mL  Liters Processed 84  Fluid Removed (mL) 3500 mL  Tolerated HD Treatment Yes  Post-Hemodialysis Comments HD tx achieved as expected, tolerated well , no complaints, pt is stable.  AVG/AVF Arterial Site Held (minutes) 10 minutes  AVG/AVF Venous Site Held (minutes) 10 minutes  Note  Observations in bed resting, alert, oriented , verbally responsive, denies pain.  Fistula / Graft Right Upper arm Arteriovenous vein graft  No placement date or time found.   Placed prior to admission: Yes  Orientation: Right  Access Location: Upper arm  Access Type: Arteriovenous vein graft  Site Condition No complications  Fistula / Graft Assessment Present;[place];Bruit  Status Deaccessed  Drainage Description None

## 2022-08-14 NOTE — Progress Notes (Addendum)
  Progress Note    08/14/2022 9:03 AM * No surgery found *  Subjective:  says he feels very " full today".  Otherwise denies any abdominal pain or leg pain   Vitals:   08/14/22 0632 08/14/22 0836  BP: 131/74 (!) 147/77  Pulse: 85 84  Resp: 17 17  Temp: 98 F (36.7 C) 98.7 F (37.1 C)  SpO2: 92% 91%   Physical Exam: Cardiac:  regular Lungs:  non labored Extremities:  2+ femoral pulses bilaterally, feet warm and well perfused Abdomen:  distended, soft, non tender Neurologic: alert and oriented  CBC    Component Value Date/Time   WBC 12.7 (H) 08/14/2022 0829   RBC 2.67 (L) 08/14/2022 0829   HGB 9.1 (L) 08/14/2022 0829   HGB 10.4 (L) 06/18/2021 1431   HCT 27.1 (L) 08/14/2022 0829   HCT 31.6 (L) 06/18/2021 1431   PLT 136 (L) 08/14/2022 0829   PLT 142 (L) 06/18/2021 1431   MCV 101.5 (H) 08/14/2022 0829   MCV 96 06/18/2021 1431   MCH 34.1 (H) 08/14/2022 0829   MCHC 33.6 08/14/2022 0829   RDW 17.4 (H) 08/14/2022 0829   RDW 14.3 06/18/2021 1431   LYMPHSABS 0.8 08/12/2022 1520   LYMPHSABS 1.5 06/18/2021 1431   MONOABS 1.1 (H) 08/12/2022 1520   EOSABS 0.0 08/12/2022 1520   EOSABS 1.0 (H) 06/18/2021 1431   BASOSABS 0.1 08/12/2022 1520   BASOSABS 0.1 06/18/2021 1431    BMET    Component Value Date/Time   NA 136 08/14/2022 0829   NA 141 06/18/2021 1431   K 3.9 08/14/2022 0829   CL 95 (L) 08/14/2022 0829   CO2 26 08/14/2022 0829   GLUCOSE 88 08/14/2022 0829   BUN 28 (H) 08/14/2022 0829   BUN 20 06/18/2021 1431   CREATININE 7.15 (H) 08/14/2022 0829   CREATININE 1.15 02/19/2017 1049   CALCIUM 8.4 (L) 08/14/2022 0829   CALCIUM 8.5 (L) 06/10/2020 0900   GFRNONAA 8 (L) 08/14/2022 0829   GFRAA 6 (L) 03/04/2020 1045    INR    Component Value Date/Time   INR 1.2 08/12/2022 1501     Intake/Output Summary (Last 24 hours) at 08/14/2022 0903 Last data filed at 08/14/2022 0516 Gross per 24 hour  Intake 555 ml  Output 3500 ml  Net -2945 ml     Assessment/Plan:   67 y.o. male  with small retroperitoneal hematoma after percutaneous intervention on 3/4 by Dr. Scot Dock.  Readmitted with Norovirus colitis. Extremities are well perfused and warm - Hemodynamically stable - Continue Aspirin and Plavix  -  Vascular surgery will be available as needed if there are any concerns   Karoline Caldwell, PA-C [facility] [phone] 08/14/2022 9:03 AM  VASCULAR STAFF ADDENDUM: I agree with the above.   Yevonne Aline. Stanford Breed, MD [facility] Phone Number: [phone] 08/14/2022 10:58 AM

## 2022-08-14 NOTE — Progress Notes (Addendum)
     Daily Progress Note Intern Pager: [phone]  Patient name: Albert Harris Medical record number: [id number] Date of birth: [birth year] Age: 67 y.o. Gender: male  Primary Care Provider: Kerin Perna, NP Consultants: Nephrology, VVS Code Status: Full code   Pt Overview and Major Events to Date:  3/6: Admitted to [facility]MTS   Assessment and Plan: Albert Harris is a 67 y.o. male presenting with abdominal pain N/V/D.  PMHx includes ESRD (TTS), HFrEF, cirrhosis, COPD, h/o CVA and PAD.  Colitis ID consulted and stated colitis secondary to residual inflammation from recent C diff infection and worsened by Norovirus. Recommended against further C diff testing. Supportive care and encouraged PO intake within renal fluid restrictions.  Retroperitoneal hematoma VVS did not recommend further intervention at this time. Hgb stable 9.1 today. -Vascular surgery signed off -Continue Plavix and ASA for recent stent  ESRD on dialysis [facility]) HD yesterday with max fluid output. Per Nephro, patient consistent above dry weight at HD the past two weeks. Recommending additional HD session 3/9 for fluid management - Nephrology following, appreciate recs  HFrEF (heart failure with reduced ejection fraction) (HCC) Mildly volume overloaded in the setting of missed HD. Fluids management per Nephro -Holding home HF meds as above  Hypertension Normotensive. -Holding home medications: Entresto, Toprol, Amlodipine   FEN/GI: Heart healthy PPx: Lovenox Dispo: Pending additional workup and clinical improvement  Subjective:  Patient assessed at bedside, sleeping after HD overnight. States he is still having diarrhea and mild abdominal pain. Trying to drink some fluids. Feels his volume status has improved and breathing is better.  Objective: Temp:  [97.7 F (36.5 C)-98.7 F (37.1 C)] 98.7 F (37.1 C) (03/08 0836) Pulse Rate:  [57-86] 84 (03/08 0836) Resp:  [17-27] 17 (03/08 0836) BP:  (117-147)/(52-77) 147/77 (03/08 0836) SpO2:  [85 %-100 %] 91 % (03/08 0836) Weight:  [69.2 kg] 69.2 kg (03/08 0538) Physical Exam: General: Laying in bed, NAD Cardiovascular: RRR, without murmur Respiratory: CTBA. Normal WOB on RA Abdomen: Soft, mild distension, mildly tender to palpation Extremities: Mild peripheral edema on R leg compared to L  Laboratory: Most recent CBC Lab Results  Component Value Date   WBC 12.7 (H) 08/14/2022   HGB 9.1 (L) 08/14/2022   HCT 27.1 (L) 08/14/2022   MCV 101.5 (H) 08/14/2022   PLT 136 (L) 08/14/2022   Most recent BMP    Latest Ref Rng & Units 08/14/2022    8:29 AM  BMP  Glucose 70 - 99 mg/dL 88   BUN 8 - 23 mg/dL 28   Creatinine 0.61 - 1.24 mg/dL 7.15   Sodium 135 - 145 mmol/L 136   Potassium 3.5 - 5.1 mmol/L 3.9   Chloride 98 - 111 mmol/L 95   CO2 22 - 32 mmol/L 26   Calcium 8.9 - 10.3 mg/dL 8.4     Other pertinent labs: none   Albert Maryland, MD 08/14/2022, 11:37 AM  PGY-1, [place] Intern pager: [phone], text pages welcome Secure chat group [place]

## 2022-08-14 NOTE — Progress Notes (Signed)
Pt receives out-pt HD at [facility] on TTS. Will assist as needed.   Jamile Rekowski Renal Navigator [phone] 

## 2022-08-14 NOTE — Progress Notes (Signed)
[facility] Progress Note   Subjective:    Seen and examined patient at bedside. Received HD overnight/AM with net UF 3.5L. Next HD tomorrow morning (1st case) to place back on routine schedule.  Objective Vitals:   08/14/22 0446 08/14/22 0516 08/14/22 0538 08/14/22 0632  BP: 138/72 (!) 132/54  131/74  Pulse: 80 79  85  Resp: (!) 22 (!) 22  17  Temp:  98.2 F (36.8 C)  98 F (36.7 C)  TempSrc:  Oral    SpO2: 94% 94%  92%  Weight:   69.2 kg    Physical Exam General: Older male, on RA, NAD Heart: S1 and S2; No murmurs, gallops, or rubs Lungs: Clear throughout; No wheezing, rales, or rhonchi Abdomen: Soft and non-tender Extremities: 1+ LLE edema, no edema RLE, no wounds or ulcers Dialysis Access: R AVG (+) B/T   Filed Weights   08/13/22 0559 08/14/22 0538  Weight: 72 kg 69.2 kg    Intake/Output Summary (Last 24 hours) at 08/14/2022 0748 Last data filed at 08/14/2022 0516 Gross per 24 hour  Intake 555 ml  Output 3500 ml  Net -2945 ml    Additional Objective Labs: Basic Metabolic Panel: Recent Labs  Lab 08/10/22 0728 08/12/22 1501 08/13/22 0359  NA 142 138 135  K 4.3 5.0 4.9  CL 100 94* 95*  CO2  --  26 24  GLUCOSE 78 118* 114*  BUN 28* 43* 48*  CREATININE 7.20* 10.75* 11.63*  CALCIUM  --  9.3 8.7*  PHOS  --   --  6.4*   Liver Function Tests: Recent Labs  Lab 08/12/22 1501 08/13/22 0359  AST 18  --   ALT 8  --   ALKPHOS 106  --   BILITOT 1.3*  --   PROT 7.7  --   ALBUMIN 3.5 2.9*   No results for input(s): "LIPASE", "AMYLASE" in the last 168 hours. CBC: Recent Labs  Lab 08/12/22 1520 08/13/22 0359 08/13/22 1408  WBC 17.1* 16.7*  --   NEUTROABS 15.1*  --   --   HGB 8.5* 7.5* 9.5*  HCT 26.6* 23.3* 29.8*  MCV 106.4* 105.4*  --   PLT 154 121*  --    Blood Culture    Component Value Date/Time   SDES BLOOD LEFT WRIST 06/28/2022 1440   SPECREQUEST  06/28/2022 1440    BOTTLES DRAWN AEROBIC AND ANAEROBIC Blood Culture results may  not be optimal due to an inadequate volume of blood received in culture bottles   CULT (A) 06/28/2022 1440    STAPHYLOCOCCUS EPIDERMIDIS THE SIGNIFICANCE OF ISOLATING THIS ORGANISM FROM A SINGLE VENIPUNCTURE CANNOT BE PREDICTED WITHOUT FURTHER CLINICAL AND CULTURE CORRELATION. SUSCEPTIBILITIES AVAILABLE ONLY ON REQUEST. Performed at [facility], [place] [address] [place], [place] [address]    REPTSTATUS 07/01/2022 FINAL 06/28/2022 1440    Cardiac Enzymes: No results for input(s): "CKTOTAL", "CKMB", "CKMBINDEX", "TROPONINI" in the last 168 hours. CBG: No results for input(s): "GLUCAP" in the last 168 hours. Iron Studies: No results for input(s): "IRON", "TIBC", "TRANSFERRIN", "FERRITIN" in the last 72 hours. Lab Results  Component Value Date   INR 1.2 08/12/2022   INR 1.3 (H) 06/29/2022   INR 1.3 (H) 06/28/2022   Studies/Results: CT ABDOMEN PELVIS W CONTRAST  Result Date: 08/12/2022 CLINICAL DATA:  Abdominal pain, nausea, vomiting, diarrhea, recent diagnosis of C. difficile EXAM: CT ABDOMEN AND PELVIS WITH CONTRAST TECHNIQUE: Multidetector CT imaging of the abdomen and pelvis was performed using the  standard protocol following bolus administration of intravenous contrast. RADIATION DOSE REDUCTION: This exam was performed according to the departmental dose-optimization program which includes automated exposure control, adjustment of the mA and/or kV according to patient size and/or use of iterative reconstruction technique. CONTRAST:  6m OMNIPAQUE IOHEXOL 350 MG/ML SOLN COMPARISON:  01/01/2022 FINDINGS: Lower chest: Cardiomegaly.  Trace bilateral pleural effusions. Hepatobiliary: No focal liver abnormality is seen. Hyperattenuating fluid within the gallbladder suggesting vicarious excretion of contrast. No pericholecystic inflammatory changes by CT. No biliary dilatation. Pancreas: Splenule is again noted abutting the distal pancreatic tail. Pancreas appears otherwise unremarkable. No  pancreatic ductal dilatation or surrounding inflammatory changes. Spleen: Normal in size without focal abnormality. Adrenals/Urinary Tract: Adrenal glands are unremarkable. Kidneys are normal, without renal calculi, focal lesion, or hydronephrosis. Bladder is decompressed. Stomach/Bowel: Stomach within normal limits. No dilated loops of bowel. Long segment mild circumferential wall thickening of the sigmoid colon, and to a lesser degree involving the descending colon. There are numerous left-sided colonic diverticula, although no focally inflamed diverticulum is seen. A normal appendix is seen in the right lower quadrant. Vascular/Lymphatic: Severe aortoiliac atherosclerosis without aneurysm. No abdominopelvic lymphadenopathy. Reproductive: Prostate is unremarkable. Other: Trace perihepatic ascites. Small volume retroperitoneal fluid within the anterolateral aspect of the left hemipelvis measuring greater than fluid density. Approximate measurements of 6.5 x 1.8 x 5.1 cm (volume = 31 cm^3). No pneumoperitoneum. Musculoskeletal: No acute or significant osseous findings. IMPRESSION: 1. Small volume retroperitoneal fluid within the left hemipelvis measuring greater than fluid density, suggestive of a retroperitoneal hematoma of indeterminate etiology. 2. Long segment mild circumferential wall thickening of the sigmoid colon, and to a lesser degree involving the descending colon. Findings are suggestive of an infectious or inflammatory colitis. 3. Trace perihepatic ascites. 4. Trace bilateral pleural effusions. 5. Severe aortoiliac atherosclerosis (ICD10-I70.0). Electronically Signed   By: NDavina PokeD.O.   On: 08/12/2022 16:41   DG Chest Port 1 View  Result Date: 08/12/2022 CLINICAL DATA:  Shortness of breath EXAM: PORTABLE CHEST 1 VIEW COMPARISON:  CXR 07/21/22 FINDINGS: No pleural effusion. No pneumothorax. Unchanged cardiomegaly. Unchanged mediastinal contours. Focal airspace opacity. Redemonstrated are  prominent bilateral interstitial opacities, which could represent pulmonary venous congestion or pulmonary edema. No radiographically apparent displaced rib fractures. Visualized upper abdomen is unremarkable. IMPRESSION: 1. Unchanged cardiomegaly with prominent bilateral interstitial opacities, which could represent pulmonary venous congestion or pulmonary edema. 2. No focal airspace opacity. Electronically Signed   By: HMarin RobertsM.D.   On: 08/12/2022 14:41    Medications:  ciprofloxacin 400 mg (08/13/22 2058)   metronidazole 500 mg (08/13/22 1952)    [START ON 08/15/2022] calcitRIOL  0.25 mcg Oral Q T,Th,Sa-HD   Chlorhexidine Gluconate Cloth  6 each Topical Q0600   Chlorhexidine Gluconate Cloth  6 each Topical Q0600   clopidogrel  75 mg Oral Daily   pantoprazole  40 mg Oral BID    Dialysis Orders:  TTS AF 4h  350/800  64.5kg  3k/2.5Ca bath  RFA AVG  Heparin none - last HD 3/02, post wt 70.8kg - has had climbing post hd wts all HD sessions for the last 2 wks - rocaltrol 0.25 mcg po tiw - venofer '50mg'$  weekly - mircera 50 mcg IV q 2 wks, last 2/29, due 3/14   Home meds include - norvasc 10, phoslo 2 ac, toprol xl 25 qd, crestor, entresto bid, asa, plavix, prns/ vits/ supps   Assessment/Plan: N/V / diarrhea - +for norovirus on GI panel Vol overload - early  edema on CXR, 8kg over at admit. Has been coming in 6- 10kg over dry wt the last 2 weeks at OP unit. Received HD early this morning and noted net UF 3.5L. He reports his breathing has improved. Plan for HD tomorrow (08/15/22) AM 1st case to place back on routine schedule.  ESRD - on HD TTS. Missed 3/05 HD.  See above.  HTN/ volume - as above, sig vol overloaded, having SOB at night lying down. Breathing has improved after HD overnight. 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 Follow.  MBD ckd - CCa and phos are in range. Cont binder and po vdra.  PAD - has procedure 3/04 per VVS for new L ext iliac stenting  for ischemic LLE pain.  Dispo - Patient received HD early this morning and plan for HD again tomorrow morning. Patient more likely will be able to go home after treatment tomorrow.  Tobie Poet, NP [facility] 08/14/2022,7:48 AM  LOS: 1 day

## 2022-08-14 NOTE — Discharge Instructions (Signed)
Sunday, by [facility] [address] [place], [place], [phone], 3.2 mi from [facility], call in advance for appointment at 10:00am or at 4:00pm, must provide valid photo ID  Monday  9:30am-5:00pm [facility], [place], [phone], 0.9 mi from [facility], can come four times per year, bring your photo ID and SS cards for other residents of household, will make appointments for those who work and need to come after 5pm  10:00am-12:00noon Hastings, [place], [place], [phone], 1.7 mi from [facility], can come once every 60 days per household, need referral from Hastings, Pacific Mutual, etc., bring photo ID and SS card   10:00am-1:00pm [employer] the [facility],  [id number] [address], [place], [phone], 7.7 mi from [facility], can come once every thirty days with a referral from [place], Boeing, [place], etc. -- each referral good for six visits, bring photo ID   10:00am-1:00pm [address], [place], [phone], 4.2 mi from [facility], can come once every 6 months, open to [facility] residents, bring photo ID and copy of a current utility bill in your name, please call first to verify that food is available  6:30pm-8:30pm [facility], [address] [address], [phone], 3.2 mi from [facility], can come once every 30 days, maximum 6 times per year, bring your photo ID and SS numbers for other residents of household  Monday by [place], [place], [place],  [phone], 2.5 mi from [facility], call in advance for appointment between 10:00am-2:00pm, bring your photo ID and SS cards for all residents of household, can come once every 3 months  [address], [place], [address], [phone], 0.7 mi from  [facility], call in advance for appointment, can come once every 30 days, bring your photo ID and SS cards for other residents of household   Tuesday  9:00am-12:00noon Boeing, [address] [address], [phone], 1.3 mi from [facility], can come once every 3 months, bring your photo ID and SS numbers for other residents of household   9:00am-1:00pm [place],  [place], [address], [phone], 1.6 mi from [facility], can come once every two weeks  9:30am-5:00pm [employer] Mutual, [place], [phone], 0.9 mi from [facility], can come four times per year, bring your photo ID and SS cards for other residents of household, will make appointments for those who work and need to come after 5pm  10:00am-12:00noon Safeway Inc, [place], [place], [phone], 1.7 mi from [facility], can come once every 60 days per household, need referral from [place], Pacific Mutual, etc., bring photo ID and SS card  10:00am-1:00pm Mattel, [place], [id number],  [phone], 3.8 mi from [facility], with referral from [place], may come six times, 30 days apart, bring your photo ID and SS cards for all residents of household   [place] the [facility] [place], [id number]  [address], [place], [phone], 7.7 mi from [facility], can come once every thirty days with a referral from [place],  Solicitor, Ramona, etc.- each referral good for six visits, bring photo ID   10:00am-1:00pm [address], [address] [place], [phone], 4.2 mi from [facility], can come once every 6 months, open to [facility] residents, bring photo ID and copy of a current utility bill in your name, please call first to verify that food is available  2:00pm-3:30pm [facility], [address], [phone],  [phone], 3.7 mi from [facility], can come twelve times per year, one bag per family, bring photo ID   FIRST AND THIRD Tuesdays  10:00am-1:00pm, [address] [place], [place], [phone], 7.2 mi from [facility], can come once every 30 days   Tuesday, WHEN FOOD IS AVAILABLE (call)  12:00noon-2:00pm [facility], [address], [address], [phone], 1.5 mi from  [facility], can come once every 30 days, bring photo ID   Tuesday, by APPOINTMENT ONLY  [facility], [place], [place],  [phone], 2.5 mi from [facility], call in advance for appointment between 1:00pm-4:00pm, bring your photo ID and SS cards for all residents of household, can come once every 3 months   [place], [address] [place], [phone], 5 mi from [facility], call one day ahead for appointment the next day between 10:00am and 12:00noon, can come once every 3 months, bring your photo ID and must qualify according to family income   [address] Further, [address] [place], [address], [phone], 0.7 mi from [facility], call in advance for appointment, can come once every 30 days, bring your photo ID and SS cards for other residents of household   [place], [place], [place],  [phone], 5.1 mi from [facility], call between 9:00am and 1:00pm M-F to make appointment. Appointments are scheduled for Tues and Thurs from 10:00am-11:30am, bring photo ID, can come once every 6 months, limit three visits over 18 months, then must have referral  Wednesday  9:30am-5:00pm [facility], [place], [phone], 0.9 mi from [facility], can come four times per year, bring your photo ID and SS cards for other residents of household, will make appointments for those who work and need to come after  5pm  9:30am-11:30am Walt Disney of [place] Father, [place], [address], [phone], 6.6 mi from [facility], can come once every 30 days, bring your photo ID, and SS cards for other residents of household, each monthly visit requires a written referral from GUM or DSS with number in household on form  10:00am-12:00noon [address], [place], [place], [phone], 1.7 mi from [facility], can come once every 60 days per household, need referral from [place], [employer] Mutual, etc., bring photo ID and SS card  10:00am-1:00pm Federal-Mogul, [id number],  [phone], 3.9 mi from [facility], with referral from [place], may come six times, 30 days apart, bring your photo ID and SS cards for all residents of household   [name] the [facility], [id number]  [address], [place], [phone], 7.7 mi from [facility], can come once every thirty days with a referral from [place], Boeing, Yahoo,  etc. - each referral good for six visits, bring photo ID   2:00pm-5:45pm [address], [place], [place], [phone], 3.2 mi from [facility], once every 30 days, first come/first served, limited to first 25, bring photo ID   6:30pm-8:30pm [facility], [address] [address], [phone], 3.2 mi from [facility], can come once every 30 days, maximum 6 times per year, bring your photo ID and SS numbers for other residents of household  FIRST and THIRD Wednesdays   9:00am-12:00noon, [address] [place], [place], [phone], 4.2 mi from [facility], can come once a month. Please arrive and sign in no later than 11:15 so everyone can be served by 12 noon.  THIRD Wednesday  1:30pm-3:00pm, [address]. [address], [place], [place], [phone] or [phone], 2.1 mi from [facility]   Wednesday, by [place], [address], [place], [phone], 5 mi from [facility], call one day ahead for appointment the next day between 10:00am and 12:00noon, can come once every 3 months, bring your photo ID and must qualify according to family income   [address], [place], [address], [phone], 0.7 mi from [facility], call in advance for appointment, can come once every 30 days, bring your photo ID and SS cards for other residents of household   [address] of [place], [address] [place], [place], [phone], 3.2 mi from [facility], call in advance for appointment at 7:00pm, must provide valid photo ID  Thursday  9:00am-12:00noon Boeing, [address] [place], [phone], 1.3 mi from [facility], can come once every 3 months, bring your photo ID and SS numbers for other residents of household   9:00am-1:00pm [place],  [place], [address], [phone]/Ext [phone], 1.6 mi from [facility], can come once every two weeks  9:30am-12:00noon [employer]neco Inc, [address], [place], [phone], 4.5 mi from [facility], can come once every 30 days, must state income [closed Thanksgiving and week of Christmas]  9:30am-5:00pm Pacific Mutual, [place], [phone], 0.9 mi from [facility], can come four times per year, bring your photo ID and SS card for other residents of household, will make appointments for those who work and need to come after 5pm  10:00am-1:00pm Blessed Table, [place], [id number],  [phone], 3.9 mi from [facility], with referral from [place]bert, may come six times, 30 days apart, bring your photo ID and SS cards for all residents of household   [place] the [facility], [id number]  [address], [place], [phone], 7.7 mi from [facility], can  come once every thirty days with a referral from Reliance, Boeing, Roff, etc.- each referral good for six visits, bring photo ID   10:00am-1:00pm [facility], [address] [place] [employer], [phone], 4.2 mi from [facility], can come once every 6 months, open to [facility] residents, bring photo ID and copy of a current utility bill in your name, please call first to verify that food is available  [place]: 8:00am served in [facility] by [place] 8:30am SHUTTLE provided from [facility], served at  [place], [place]. LUNCH TWO LOCATIONS [plus one additional third Sunday only] 10:30am - 12:30pm served at CMS Energy Corporation, Pacific Mutual, [place] (1.2 miles from [facility]) 12:30pm served in [place] by BJ's Team ([place] Sunday only) 1:30pm served at [facility] by [facility] one location [plus one additional third Sunday only] 5:00pm Every Sunday, served under the bridge at [place]. by [place] (.7 miles from [facility]) ([place] Sunday ONLY) 4:00pm served in the parking garage, across from Sempra Energy, corner of [place] and [place] by H. J. Heinz Works Ministries MONDAYS BREAKFAST 7:30am served in [employer] Energy by the Health Net and Friends LUNCH 10:30am - 12:30pm served at [employer] Energy Corporation, Pacific Mutual, Walnut [place] (1.2 miles from [facility]) [place]: 7:00pm served in front of the courthouse at the corner of Pitney Bowes and Tech Data Corporation. by [facility] Monday Night Meal (3 blocks from [facility]) 4:30pm served at the Time Warner, [place] Whatcom by Peter Kiewit Sons Not Bombs (0.6 miles from [facility]) College served at PepsiCo, [place] (0.3 miles  from [place]) LUNCH 10:30am - 12:30pm served at the [employer] Corporation, [place] [address], (1.2 miles from [place]) [place] 6:00pm served at Dole Food, enter from Brink's Company and go to the Omnicom, (0.7 miles from [place]) [place] 7:00am - 8:00am served at [employer], [place] [address] (1.2 miles from [place]) [place] [plus two additional locations listed below] 10:30am - 12:30pm served at [employer], [place] [address] (1.2 miles from [place]) (FIRST Wednesday ONLY) 11:30am served at Kelly Services, [place] (6.6 miles from [place]) ([place] Wednesday ONLY) 11:00am served at [place]ow, [place] (1.3 miles from [place]) [place] 6:00pm served at Amgen Inc, [place] Wachovia Corporation. (1.3 miles from [facility]) 4:00pm - 6:00pm (hot dogs and chips) served at NCR Corporation of [facility], [place] (1.7 miles from [place]) [place] 10:30am - 12:30pm served at [employer], Pacific Mutual, [place] [address], (1.2 miles from [place]) [place] 6:00pm served at Dole Food, enter from Brink's Company and go to the Omnicom, (0.7 miles from Sempra Energy) [place] 10:30am - 12:30pm served at [employer], [place] [address] (1.2 miles from [place]) [place], [plus one additional first Friday only] 6:00pm served under the bridge at [place]. [place]. by St. Martin. (.7 miles from [facility]) 5:00pm - 7:00pm served at NCR Corporation of [facility]., [place] (1.7 miles from [place]) (FIRST Friday ONLY) 5:45 pm - SHUTTLE provided from the [place] at 5:45pm. Served at [facility], [place] [plus one additional last Saturday only] 8:00am served at [facility] by Smithfield Foods 8:30am served at Land O'Lakes, Towamensing Trails KB Home	[place]. (2.2 miles from [facility]) (LAST Saturday ONLY) 8:30am served  at Sanmina-SCI, [place] (5 miles from [place]ley) LUNCH 10:30am - 12:30pm served at [employer] Energy Corporation, Pacific Mutual [place] Sharolyn Douglas., (1.2 miles from [facility]) [place] 6:00pm served under the bridge at [place]. by The Procter & Gamble (0.7 miles from Sempra Energy)  [place] at Alexander. (.7 miles from [place]) [place] on [place] Right onto Northwest Airlines 433 ft. Continue onto [address] under bridge, about 500 ft. Courthouse (3 blocks from Barnet [facility]) [place] on [place] right on [place] 1 block to Parker Hannifin (.5 miles from [place]) [place] Center on [place]. Temple-Inland. past WellPoint to Cablevision Systems. Enter from Brink's Company and go to the Office Depot building Amgen Inc [address] W[address]. (1.3 miles from St[address] Elizabeth [place]) [place] on [place] Right onto W. Sharolyn Douglas. church will be on the Left. [address] (.3 miles from [facility]) Go .3 miles on W. Friendly Destination is on your right Kelly Services at American International Group (6.6 miles from [place]) [place] on Cut Off toward W Friendly Turn right onto [address] Friendly Continue onto Verizon. Continue onto Jacona is on right [facility] AK Steel Holding Corporation) [place] [address] (.6 miles from [place]) [place] on [place]. [address] Turn Left onto E. Kronenwetter. 0.3 miles Destination is on the Left. [place] at R.R. Donnelley (5 miles from Sempra Energy) 1. Head south on [place] right onto W Friendly Turn slightly left onto Colgate-Palmolive Continue onto Colgate-Palmolive Turn right at [place] to church on right New Birth Sounds of [facility] [address]/[place]ug[place] (1.7 miles from [place]ba) Lares on Maynard 1.4 miles [place] becomes [place]. Elm [address] Continue 0.6 miles and church will be on theright. [place] at [address] (2.5 miles from Sempra Energy) [place] provided from [place] on [place]. Elm toward Lowe's Companies right onto El Paso Corporation left onto Office Depot left onto Rockwood Baxter International (2.2 miles from [place]) [place] on Solvay 1.4 miles [place] becomes [place]. [place] Turn right onto [place]. and church will be on the Left. Potter's House/[place] ArvinMeritor [place] Tolu (1.2 miles from [facility]) 1.Turn right onto [facility] 2.Turn left onto Carlos Levering 3.Maryclare [place]rad[place] 4.Destination is on your right [place] at Home Depot (1.3 miles from [facility]) [place] on Grant left onto Emerson Electric right onto S. [place] onto Fiserv. Turn left onto Manley [place] right onto Campbell Soup.

## 2022-08-14 NOTE — Hospital Course (Signed)
Albert Harris is a 67 y.o.male with a history of ESRD (TTS), HFrEF, cirrhosis, COPD, h/o CVA and PAD who was admitted to the [facility] at [facility] for abdominal pain. Her hospital course is detailed below:  Colitis Norovirus Admitted with N/V/D in the setting of recent hospitalization for C diff infection. Initially started on Cipro and Flagyl pending GI pathogen panel. GI panel positive of Norovirus and abx discontinued. ID consulted given prior history of C diff x2 in the last 6 months and colitis, confirmed colitis likely secondary to residual C Diff and Norovirus. Supportive management provided and diarrhea improving at the time of discharge.  Retroperitoneal hematoma Patient with recent iliac stent placed on 08/10/2022 by VVS and started on Plavix and ASA. CT abdomen incidentally showed small retroperitoneal hematoma upon admission. VVS consulted and recommended conservative management and likely a sequelae of his procedure. Hgb 10.5 upon admission but dropped to 7.5, received 1 unit pRBC. Hgb stable at 9.6 upon discharge.  ESRD HFrEF Hypervolemic upon admission in the setting of one missed HD session due to illness and possible HF exacerbation. Initially required 2L [place] in ED but weaned to RA upon admission. Nephrology consulted, recommended two additional HD sessions for fluid removal. Initially softer BPs that resolved by discharged and he was restarted on his home Entresto and Metoprolol. Held home Amlodipine with plans to restart outpatient.   PCP Follow-up Recommendations:  VVS follow up for stent surveillance BP check and restart home Amlodipine as needed

## 2022-08-14 NOTE — Plan of Care (Signed)
  Problem: Activity: Goal: Ability to return to baseline activity level will improve Outcome: Progressing   Problem: Education: Goal: Knowledge of General Education information will improve Description: Including pain rating scale, medication(s)/side effects and non-pharmacologic comfort measures Outcome: Progressing   Problem: Health Behavior/Discharge Planning: Goal: Ability to manage health-related needs will improve Outcome: Progressing   Problem: Clinical Measurements: Goal: Ability to maintain clinical measurements within normal limits will improve Outcome: Progressing Goal: Will remain free from infection Outcome: Progressing

## 2022-08-15 LAB — RENAL FUNCTION PANEL
Albumin: 3 g/dL — ABNORMAL LOW (ref 3.5–5.0)
Anion gap: 16 — ABNORMAL HIGH (ref 5–15)
BUN: 41 mg/dL — ABNORMAL HIGH (ref 8–23)
CO2: 25 mmol/L (ref 22–32)
Calcium: 8.5 mg/dL — ABNORMAL LOW (ref 8.9–10.3)
Chloride: 95 mmol/L — ABNORMAL LOW (ref 98–111)
Creatinine, Ser: 8.78 mg/dL — ABNORMAL HIGH (ref 0.61–1.24)
GFR, Estimated: 6 mL/min — ABNORMAL LOW
Glucose, Bld: 91 mg/dL (ref 70–99)
Phosphorus: 6.9 mg/dL — ABNORMAL HIGH (ref 2.5–4.6)
Potassium: 4.3 mmol/L (ref 3.5–5.1)
Sodium: 136 mmol/L (ref 135–145)

## 2022-08-15 LAB — CBC
HCT: 29.9 % — ABNORMAL LOW (ref 39.0–52.0)
Hemoglobin: 9.6 g/dL — ABNORMAL LOW (ref 13.0–17.0)
MCH: 33.4 pg (ref 26.0–34.0)
MCHC: 32.1 g/dL (ref 30.0–36.0)
MCV: 104.2 fL — ABNORMAL HIGH (ref 80.0–100.0)
Platelets: 140 K/uL — ABNORMAL LOW (ref 150–400)
RBC: 2.87 MIL/uL — ABNORMAL LOW (ref 4.22–5.81)
RDW: 17.1 % — ABNORMAL HIGH (ref 11.5–15.5)
WBC: 10.2 K/uL (ref 4.0–10.5)
nRBC: 0 % (ref 0.0–0.2)

## 2022-08-15 MED ORDER — SACUBITRIL-VALSARTAN 24-26 MG PO TABS
1.0000 | ORAL_TABLET | Freq: Two times a day (BID) | ORAL | Status: DC
  Administered 2022-08-15: 1 via ORAL
  Filled 2022-08-15 (×3): qty 1

## 2022-08-15 MED ORDER — METOPROLOL SUCCINATE ER 25 MG PO TB24
25.0000 mg | ORAL_TABLET | Freq: Every day | ORAL | Status: DC
  Administered 2022-08-15: 25 mg via ORAL
  Filled 2022-08-15 (×2): qty 1

## 2022-08-15 NOTE — Progress Notes (Addendum)
[facility] Progress Note   Subjective:    Seen and examined patient on HD. Blood pressure 165/84. UFG set 4L and so far tolerating. Remains on RA. May go home later today.  Objective Vitals:   08/15/22 0555 08/15/22 0738 08/15/22 0739 08/15/22 0754  BP: (!) 175/90  (!) 172/69 (!) 178/75  Pulse: 81  76 79  Resp: '16  16 16  '$ Temp: 98 F (36.7 C)  97.6 F (36.4 C)   TempSrc: Oral  Oral   SpO2: 99%  100% 99%  Weight:  68.5 kg     Physical Exam General: Older male, on RA, NAD Heart: S1 and S2; No murmurs, gallops, or rubs Lungs: Clear anteriorly; No wheezing, rales, or rhonchi Abdomen: Soft and non-tender Extremities: 1+ LLE edema, no edema RLE, no wounds or ulcers Dialysis Access: R AVG (+) B/T   Filed Weights   08/14/22 0538 08/15/22 0500 08/15/22 0738  Weight: 69.2 kg 69.8 kg 68.5 kg   No intake or output data in the 24 hours ending 08/15/22 0830  Additional Objective Labs: Basic Metabolic Panel: Recent Labs  Lab 08/13/22 0359 08/14/22 0829 08/15/22 0324  NA 135 136 136  K 4.9 3.9 4.3  CL 95* 95* 95*  CO2 '24 26 25  '$ GLUCOSE 114* 88 91  BUN 48* 28* 41*  CREATININE 11.63* 7.15* 8.78*  CALCIUM 8.7* 8.4* 8.5*  PHOS 6.4* 5.0* 6.9*   Liver Function Tests: Recent Labs  Lab 08/12/22 1501 08/13/22 0359 08/14/22 0829 08/15/22 0324  AST 18  --   --   --   ALT 8  --   --   --   ALKPHOS 106  --   --   --   BILITOT 1.3*  --   --   --   PROT 7.7  --   --   --   ALBUMIN 3.5 2.9* 3.0* 3.0*   No results for input(s): "LIPASE", "AMYLASE" in the last 168 hours. CBC: Recent Labs  Lab 08/12/22 1520 08/13/22 0359 08/13/22 1408 08/14/22 0829 08/15/22 0324  WBC 17.1* 16.7*  --  12.7* 10.2  NEUTROABS 15.1*  --   --   --   --   HGB 8.5* 7.5* 9.5* 9.1* 9.6*  HCT 26.6* 23.3* 29.8* 27.1* 29.9*  MCV 106.4* 105.4*  --  101.5* 104.2*  PLT 154 121*  --  136* 140*   Blood Culture    Component Value Date/Time   SDES BLOOD LEFT WRIST 06/28/2022 1440    SPECREQUEST  06/28/2022 1440    BOTTLES DRAWN AEROBIC AND ANAEROBIC Blood Culture results may not be optimal due to an inadequate volume of blood received in culture bottles   CULT (A) 06/28/2022 1440    STAPHYLOCOCCUS EPIDERMIDIS THE SIGNIFICANCE OF ISOLATING THIS ORGANISM FROM A SINGLE VENIPUNCTURE CANNOT BE PREDICTED WITHOUT FURTHER CLINICAL AND CULTURE CORRELATION. SUSCEPTIBILITIES AVAILABLE ONLY ON REQUEST. Performed at [facility], [place] [address], [place], [place] [address]    REPTSTATUS 07/01/2022 FINAL 06/28/2022 1440    Cardiac Enzymes: No results for input(s): "CKTOTAL", "CKMB", "CKMBINDEX", "TROPONINI" in the last 168 hours. CBG: No results for input(s): "GLUCAP" in the last 168 hours. Iron Studies: No results for input(s): "IRON", "TIBC", "TRANSFERRIN", "FERRITIN" in the last 72 hours. Lab Results  Component Value Date   INR 1.2 08/12/2022   INR 1.3 (H) 06/29/2022   INR 1.3 (H) 06/28/2022   Studies/Results: No results found.  Medications:   aspirin EC  81 mg  Oral Daily   calcitRIOL  0.25 mcg Oral Q T,Th,Sa-HD   Chlorhexidine Gluconate Cloth  6 each Topical Q0600   clopidogrel  75 mg Oral Daily   enoxaparin (LOVENOX) injection  30 mg Subcutaneous Q24H   pantoprazole  40 mg Oral BID    Dialysis Orders: TTS AF 4h  350/800  64.5kg  3k/2.5Ca bath  RFA AVG  Heparin none - last HD 3/02, post wt 70.8kg - has had climbing post hd wts all HD sessions for the last 2 wks - rocaltrol 0.25 mcg po tiw - venofer '50mg'$  weekly - mircera 50 mcg IV q 2 wks, last 2/29, due 3/14   Home meds include - norvasc 10, phoslo 2 ac, toprol xl 25 qd, crestor, entresto bid, asa, plavix, prns/ vits/ supps  Assessment/Plan: N/V / diarrhea - +for norovirus on GI panel Vol overload - early edema on CXR, 8kg over at admit. Has been coming in 6- 10kg over dry wt the last 2 weeks at OP unit.  S/p HD 3/8 early morning and noted net UF 3.5L. He reports his breathing has improved. On HD.  Pre-HD standing weight 68.5kg, UFG set 4L today. Discussed fluid restriction at home and following low-sodium diet. ESRD - on HD TTS. Missed 3/05 HD.  See above.  HTN/ volume - as above, sig vol overloaded, having SOB at night lying down. Breathing has improved after HD overnight. BP's okay. On HD. 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 Follow.  MBD ckd - CCa and phos are in range. Cont binder and po vdra.  PAD - has procedure 3/04 per VVS for new L ext iliac stenting for ischemic LLE pain.  Dispo - S/p HD 3/8 and on HD today. Patient more likely will be able to go home after treatment tomorrow if he remains stable.  Tobie Poet, NP [facility] 08/15/2022,8:30 AM  LOS: 2 days

## 2022-08-15 NOTE — Progress Notes (Signed)
   08/15/22 1146  Vitals  Temp 97.8 F (36.6 C)  BP 120/67  Pulse Rate 82  Resp 18  Level of Consciousness  Level of Consciousness Alert  MEWS COLOR  MEWS Score Color Green  Oxygen Therapy  SpO2 100 %  O2 Device Room Air  Patient Activity (if Appropriate) In bed  Pulse Oximetry Type Continuous  Pain Assessment  Pain Scale 0-10  Pain Score 0  PCA/Epidural/Spinal Assessment  Respiratory Pattern Unlabored;Regular  Height and Weight  Weight 64.9 kg  Type of Weight Post-Dialysis  BMI (Calculated) 21.76  ECG Monitoring  Cardiac Rhythm NSR  MEWS Score  MEWS Temp 0  MEWS Systolic 0  MEWS Pulse 0  MEWS RR 0  MEWS LOC 0  MEWS Score 0   Received patient in bed to unit.  Alert and oriented.  Informed consent signed and in chart.   [place]arton duration:3.5  Patient tolerated well.  Transported back to the room  Alert, without acute distress.  Hand-off given to patient's nurse.   Access used: Yes Access issues: No   Total UF removed: 3400 Medication(s) given: Calcitrol 0.39mcg Post HD VS: See Above Post HD weight: 64.9kg   [facility]

## 2022-08-15 NOTE — Progress Notes (Signed)
Pt discharged home in stable condition after hemodialysis this morning

## 2022-08-15 NOTE — Progress Notes (Signed)
Remove 4L UF per NP Tobie Poet.

## 2022-08-15 NOTE — Discharge Summary (Signed)
[facility] Discharge Summary  Patient name: Albert Harris Medical record number: [id number] Date of birth: [birth year] Age: 67 y.o. Gender: male Date of Admission: 08/12/2022  Date of Discharge: 08/15/2022 Admitting Physician: Blane Ohara McDiarmid, MD  Primary Care Provider: Kerin Perna, NP Consultants: VVS, Nephrology  Indication for Hospitalization: Abdominal pain  Discharge Diagnoses/Problem List:  Principal Problem for Admission: Abdominal pain Other Problems addressed during stay:  Principal Problem:   Nucleic acid assay by PCR positive for norovirus Active Problems:   Colitis   Retroperitoneal hematoma   ESRD on dialysis ([place])   HFrEF (heart failure with reduced ejection fraction) (HCC)   Hypertension   Nausea vomiting and diarrhea  Brief Hospital Course:  Albert Harris is a 67 y.o.male with a history of ESRD (TTS), HFrEF, cirrhosis, COPD, h/o CVA and PAD who was admitted to the [facility] Medicine Teaching Service at [facility] for abdominal pain. Her hospital course is detailed below:  Colitis Norovirus Admitted with N/V/D in the setting of recent hospitalization for C diff infection. Initially started on Cipro and Flagyl pending GI pathogen panel. GI panel positive of Norovirus and abx discontinued. ID consulted given prior history of C diff x2 in the last 6 months and colitis, confirmed colitis likely secondary to residual C Diff and Norovirus. Supportive management provided and diarrhea improving at the time of discharge.  Retroperitoneal hematoma Patient with recent iliac stent placed on 08/10/2022 by VVS and started on Plavix and ASA. CT abdomen incidentally showed small retroperitoneal hematoma upon admission. VVS consulted and recommended conservative management and likely a sequelae of his procedure. Hgb 10.5 upon admission but dropped to 7.5, received 1 unit pRBC. Hgb stable at 9.6 upon discharge.  ESRD HFrEF Hypervolemic upon admission in  the setting of one missed HD session due to illness and possible HF exacerbation. Initially required 2L [place] in ED but weaned to RA upon admission. Nephrology consulted, recommended two additional HD sessions for fluid removal. Initially softer BPs that resolved by discharged and he was restarted on his home Entresto and Metoprolol. Held home Amlodipine with plans to restart outpatient.   PCP Follow-up Recommendations:  VVS follow up for stent surveillance BP check and restart home Amlodipine as needed  Disposition: Home  Discharge Condition: Stable   Discharge Exam:  Vitals:   08/15/22 1100 08/15/22 1146  BP: (!) 140/74 120/67  Pulse: 74 82  Resp: 18 18  Temp:  97.8 F (36.6 C)  SpO2: 100% 100%   General: Laying in bed in HD, NAD Cardiovascular: RRR, without murmur Respiratory: CTBA. Normal WOB on RA Abdomen: Soft, mild distension, mildly tender to palpation of LLQ Extremities: Minimal peripheral edema on R leg compared to L  Significant Procedures:   Significant Labs and Imaging:  Recent Labs  Lab 08/14/22 0829 08/15/22 0324  WBC 12.7* 10.2  HGB 9.1* 9.6*  HCT 27.1* 29.9*  PLT 136* 140*   Recent Labs  Lab 08/14/22 0829 08/15/22 0324  NA 136 136  K 3.9 4.3  CL 95* 95*  CO2 26 25  GLUCOSE 88 91  BUN 28* 41*  CREATININE 7.15* 8.78*  CALCIUM 8.4* 8.5*  PHOS 5.0* 6.9*  ALBUMIN 3.0* 3.0*    Pertinent Imaging: CT ABDOMEN PELVIS W CONTRAST Result Date: 08/12/2022 IMPRESSION: 1. Small volume retroperitoneal fluid within the left hemipelvis measuring greater than fluid density, suggestive of a retroperitoneal hematoma of indeterminate etiology. 2. Long segment mild circumferential wall thickening of the sigmoid colon, and  to a lesser degree involving the descending colon. Findings are suggestive of an infectious or inflammatory colitis. 3. Trace perihepatic ascites. 4. Trace bilateral pleural effusions. 5. Severe aortoiliac atherosclerosis (ICD10-I70.0).   DG Chest  Port 1 View Result Date: 08/12/2022 IMPRESSION: 1. Unchanged cardiomegaly with prominent bilateral interstitial opacities, which could represent pulmonary venous congestion or pulmonary edema. 2. No focal airspace opacity.  PERIPHERAL VASCULAR CATHETERIZATION Result Date: 08/10/2022 Table formatting from the original result was not included. Images from the original result were not included. PATIENT: Albert Harris      MRN: [id number] DOB: 1956/02/01    DATE OF PROCEDURE: 08/10/2022 INDICATIONS:  Albert Harris is a 67 y.o. male who presented with rest pain of his left foot.  He has undergone a previous right iliofemoral endarterectomy and a right femoropopliteal bypass.  He is also had bilateral iliac stents. PROCEDURE:  Conscious sedation Ultrasound-guided access to the left common femoral artery Aortogram with left lower extremity runoff Angioplasty and stenting of the left external iliac artery (7 mm x 5 cm Viabahn stent x 2; postdilatation with a 6 mm balloon) SURGEON: Judeth Cornfield. Scot Dock, MD, FACS ANESTHESIA: Local with sedation EBL: Minimal TECHNIQUE: The patient was brought to the peripheral vascular lab and was sedated. The period of conscious sedation was 105 minutes.  During that time period, I was present face-to-face 100% of the time.  The patient was administered initially 1 mg of Versed and 50 mcg of fentanyl. [name] heart rate, blood pressure, and oxygen saturation were monitored by the nurse continuously during the procedure. Both groins were prepped and draped in the usual sterile fashion.  Under ultrasound guidance, after the skin was anesthetized, I cannulated the left common femoral artery with a micropuncture needle and a micropuncture sheath was introduced over a wire.  By ultrasound the femoral artery was patent.  There was significant calcific disease in the common femoral artery.  A real-time image was obtained and sent to the server.This was exchanged for a 5 [place] sheath over a  B[place]on wire.  It was difficult to pass the wire and ultimately the wire and sheath came out.  I held pressure for 10 minutes.  There was no evidence of hematoma.  I then under ultrasound guidance again accessed the left common femoral artery with a micropuncture needle.  This time I used the Nitrex wire and was able to navigate into the common iliac artery and advanced the micropuncture sheath over the wire.  I then used an angled Glidewire to get up into the infrarenal aorta.  I exchanged this for a Rosen wire over a straight catheter.  I then placed a 5 French sheath in the left groin.  The pigtail catheter was positioned at the L1 vertebral body and flush aortogram obtained.  The cath was positioned above the aortic bifurcation and an oblique iliac projection was obtained.  The patient had significant disease in the external iliac artery on the left and I elected to address this with angioplasty and stenting.  I attempted to advance a 7 [place] destination sheath over the [facility] wire but met significant resistance.  Therefore the patient was heparinized.  ACT was monitored throughout the procedure.  I dilated the area of concern in the external iliac artery with a 5 mm x 4 cm balloon.  At this point I was able to advance the 7 French sheath.  I then exchanged wires for a 0.018 wire.  I selected a 7 x 50 Viabahn  stent.  This was positioned below the takeoff of the hypogastric artery which was occluded.  This was deployed without difficulty and postdilatation was done with a 6 mm balloon.  There was an area of concern the distal stent and I elected to cover this.  A second 7 mm x 50 mm Viabahn stent was overlapped to cover the area of concern and this was deployed without difficulty.  Postdilatation was done with a 6 mm balloon.  There was an excellent result with no residual stenosis. Runoff film was then obtained on the left.  At the at the completion we exchanged the long 7 French sheath for a short 7 [place]  sheath which was easily advanced into the iliac system on the left at this point. FINDINGS: The infrarenal aorta is patent with no significant stenosis identified.  The renal arteries are patent with no significant renal artery stenosis identified. On the right side the common iliac and external iliac arteries are patent. On the left side the common iliac artery is patent and it has previously been stented.  There was diffuse disease in the external iliac artery with up to an 80% stenosis.  This was successfully addressed with angioplasty and stenting as described above with no residual stenosis. Low that the common femoral had calcific disease but was patent.  The deep femoral artery is patent.  The superficial femoral artery is occluded at its origin with reconstitution of the above-knee popliteal artery.  The patient has diffuse tibial disease.  The dominant runoff is the peroneal artery which occludes above the foot.  The anterior tibial artery has diffuse disease proximally and then occludes in the mid leg.  There is reconstitution of the distal anterior tibial and dorsalis pedis artery on the left.  The posterior tibial artery is occluded and reconstitutes above the ankle with moderate disease. CLINICAL NOTE: Hopefully by addressing the inflow this will help with his rest pain.  Otherwise we would have to consider infrainguinal bypass.  He has diffuse calcific disease in the common femoral artery and the stent extends very low.  I would like to avoid bypass if possible.  However when he returns for a follow-up visit we will obtain vein mapping.  He has not been taking aspirin or a statin.  He was started on 81 mg of aspirin, 75 mg of Plavix daily, and 10 mg of Crestor daily.  This was sent to his pharmacy. WIfI 0 1 0 Clinical stage I [name], MD, FACS [facility]   VAS [place] LOWER EXTREMITY BYPASS GRAFT DUPLEX Result Date: 08/06/2022 Summary: Right: Patent right  femoral to below knee bypass graft with no evidence for restenosis.  See table(s) above for measurements and observations.   VAS [place] ABI WITH/WO TBI Result Date: 08/06/2022 Summary: Right: Resting right ankle-brachial index indicates noncompressible right lower extremity arteries. The right toe-brachial index is abnormal. Left: Resting left ankle-brachial index indicates moderate left lower extremity arterial disease. The left toe-brachial index is abnormal. *See table(s) above for measurements and observations.    Results/Tests Pending at Time of Discharge: None  Discharge Medications:  Allergies as of 08/15/2022       Reactions   Lidocaine-prilocaine Rash   Caused red marks up and down arm        Medication List     STOP taking these medications    amLODipine 10 MG tablet Commonly known as: NORVASC       TAKE these medications  albuterol 108 (90 Base) MCG/ACT inhaler Commonly known as: VENTOLIN HFA INHALE 1-2 PUFFS INTO THE LUNGS EVERY 6 (SIX) HOURS AS NEEDED FOR WHEEZING OR SHORTNESS OF BREATH.   aspirin EC 81 MG tablet Take 1 tablet (81 mg total) by mouth daily. Swallow whole. What changed: additional instructions   calcium acetate 667 MG capsule Commonly known as: PHOSLO Take 2 capsule by mouth three times a day with meals What changed:  how much to take when to take this   clopidogrel 75 MG tablet Commonly known as: Plavix Take 1 tablet (75 mg total) by mouth daily.   Dialyvite/Zinc Tabs Take 1 tablet by mouth daily.   Entresto 24-26 MG Generic drug: sacubitril-valsartan Take 1 tablet by mouth 2 (two) times daily.   metoprolol succinate 25 MG 24 hr tablet Commonly known as: TOPROL-XL Take 1 tablet (25 mg total) by mouth daily.   rosuvastatin 10 MG tablet Commonly known as: Crestor Take 1 tablet (10 mg total) by mouth daily.        Discharge Instructions: Please refer to Patient Instructions section of EMR for full details.  Patient was  counseled important signs and symptoms that should prompt return to medical care, changes in medications, dietary instructions, activity restrictions, and follow up appointments.   Follow-Up Appointments:  Follow-up Information     Kerin Perna, NP. Schedule an appointment as soon as possible for a visit.   Specialty: Internal Medicine Why: Please make an appointment to be seen at your earliest convenience for a hospital follow up. Contact information: [place] [address] [phone]                 Colletta Maryland, MD 08/15/2022, 2:27 PM PGY-1, [place]

## 2022-08-15 NOTE — Progress Notes (Signed)
     Daily Progress Note Intern Pager: [phone]  Patient name: Albert Harris Medical record number: [id number] Date of birth: 20-Apr-1956 Age: 67 y.o. Gender: male  Primary Care Provider: Kerin Perna, NP Consultants: Nephrology, VVS Code Status: Full code   Pt Overview and Major Events to Date:  3/6: Admitted to [facility]   Assessment and Plan: Albert Harris is a 67 y.o. male presenting with abdominal pain N/V/D.  PMHx includes ESRD (TTS), HFrEF, cirrhosis, COPD, h/o CVA and PAD.  Colitis Improving diarrhea with mild LLQ pain in the setting of colitis. Continue supportive care  Retroperitoneal hematoma Hgb stable at 9.6. Cleared by VVS for outpatient follow up. -Vascular surgery signed off -Continue Plavix and ASA for recent stent  ESRD on dialysis [facility]) Receiving HD this AM and cleared to resume outpatient dialysis per Nephrology. - Nephrology following, appreciate recs  HFrEF (heart failure with reduced ejection fraction) ([place]) Appears euvolemic upon exam. Will restart Entresto and Toprolol given elevated BP  Hypertension Mildly hypertensive -Restart home Entreso and Toprolol -Holding home Amlodipine to restart outpatient    FEN/GI: Heart healthy PPx: Lovenox Dispo: Home after HD today  Subjective:  Patient assessed at bedside, states his diarrhea is improving. Reports he feels well and has no difficulty breathing. Endorses mild LLQ pain in the setting of known colitis.  Objective: Temp:  [97.6 F (36.4 C)-98.8 F (37.1 C)] 97.6 F (36.4 C) (03/09 0739) Pulse Rate:  [76-95] 87 (03/09 0901) Resp:  [16-18] 16 (03/09 0901) BP: (121-178)/(63-90) 154/63 (03/09 0901) SpO2:  [92 %-100 %] 95 % (03/09 0901) Weight:  [68.5 kg-69.8 kg] 68.5 kg (03/09 0738) Physical Exam: General: Laying in bed in HD, NAD Cardiovascular: RRR, without murmur Respiratory: CTBA. Normal WOB on RA Abdomen: Soft, mild distension, mildly tender to palpation of LLQ Extremities:  Minimal peripheral edema on R leg compared to L  Laboratory: Most recent CBC Lab Results  Component Value Date   WBC 10.2 08/15/2022   HGB 9.6 (L) 08/15/2022   HCT 29.9 (L) 08/15/2022   MCV 104.2 (H) 08/15/2022   PLT 140 (L) 08/15/2022   Most recent BMP    Latest Ref Rng & Units 08/15/2022    3:24 AM  BMP  Glucose 70 - 99 mg/dL 91   BUN 8 - 23 mg/dL 41   Creatinine 0.61 - 1.24 mg/dL 8.78   Sodium 135 - 145 mmol/L 136   Potassium 3.5 - 5.1 mmol/L 4.3   Chloride 98 - 111 mmol/L 95   CO2 22 - 32 mmol/L 25   Calcium 8.9 - 10.3 mg/dL 8.5     Other pertinent labs: MRSA: positive Lactoferrin: negative   Albert Maryland, MD 08/15/2022, 9:43 AM  PGY-1, [place] Intern pager: [phone], text pages welcome Secure chat group [place]

## 2022-08-16 LAB — HEPATITIS B SURFACE ANTIBODY, QUANTITATIVE: Hep B S AB Quant (Post): 16.3 m[IU]/mL (ref >9.9)

## 2022-08-16 NOTE — TOC Transition Note (Signed)
Transition of Care - Initial Contact from Inpatient Facility  Date of discharge: 08/15/22 Date of contact: 08/16/22  Method: Attempted Phone Call Spoke to: No Answer  Contacted patient to discuss transition of care from recent inpatient hospitalization but he didn't pick up his phone. Left a voicemail to call the [facility] HD unit [phone] for any questions.  Patient will return to his outpatient HD unit on: Tuesday, March 12th at [facility].  Tobie Poet, NP

## 2022-08-17 ENCOUNTER — Telehealth (INDEPENDENT_AMBULATORY_CARE_PROVIDER_SITE_OTHER): Payer: Self-pay

## 2022-08-17 NOTE — Progress Notes (Signed)
Late Note Entry  Pt was d/c to home on Saturday. Contacted [facility] this morning to advise clinic of pt's d/c and that pt should resume care tomorrow.   Melven Sartorius Renal Navigator [phone]

## 2022-08-17 NOTE — Transitions of Care (Post Inpatient/ED Visit) (Signed)
   08/17/2022  Name: Albert Harris MRN: [id number] DOB: 1956/04/05  Today's TOC FU Call Status: Unsuccessful Call (1st Attempt) Date: 08/17/22  Attempted to reach the patient regarding the most recent Inpatient/ED visit.  Follow Up Plan: Additional outreach attempts will be made to reach the patient to complete the Transitions of Care (Post Inpatient/ED visit) call.   [place] LPN [place] Advisor Direct Dial [phone]

## 2022-08-18 NOTE — Transitions of Care (Post Inpatient/ED Visit) (Unsigned)
   08/18/2022  Name: Albert Harris MRN: [id number] DOB: [birth year]  Today's TOC FU Call Status: Today's TOC FU Call Status:: Unsuccessful Call (2nd Attempt) Unsuccessful Call (1st Attempt) Date: 08/17/22 Unsuccessful Call (2nd Attempt) Date: 08/18/22  Attempted to reach the patient regarding the most recent Inpatient/ED visit.  Follow Up Plan: Additional outreach attempts will be made to reach the patient to complete the Transitions of Care (Post Inpatient/ED visit) call.   [place] LPN [place] Advisor Direct Dial [phone]

## 2022-08-19 ENCOUNTER — Encounter (INDEPENDENT_AMBULATORY_CARE_PROVIDER_SITE_OTHER): Payer: Self-pay

## 2022-08-19 NOTE — Transitions of Care (Post Inpatient/ED Visit) (Signed)
   08/19/2022  Name: Albert Harris MRN: [id number] DOB: September 23, 1955  Today's TOC FU Call Status: Today's TOC FU Call Status:: Unsuccessful Call (3rd Attempt) Unsuccessful Call (1st Attempt) Date: 08/17/22 Unsuccessful Call (2nd Attempt) Date: 08/18/22 Unsuccessful Call (3rd Attempt) Date: 08/19/22  Attempted to reach the patient regarding the most recent Inpatient/ED visit.  Follow Up Plan: No further outreach attempts will be made at this time. We have been unable to contact the patient.  [place] LPN [place] Advisor Direct Dial [phone]

## 2022-08-21 ENCOUNTER — Encounter (HOSPITAL_COMMUNITY): Payer: Self-pay | Admitting: Emergency Medicine

## 2022-08-21 ENCOUNTER — Other Ambulatory Visit: Payer: Self-pay

## 2022-08-21 ENCOUNTER — Telehealth: Payer: Self-pay

## 2022-08-21 ENCOUNTER — Emergency Department (HOSPITAL_COMMUNITY): Payer: Medicare Other

## 2022-08-21 ENCOUNTER — Inpatient Hospital Stay (HOSPITAL_COMMUNITY)
Admission: EM | Admit: 2022-08-21 | Discharge: 2022-08-24 | DRG: 270 | Disposition: A | Discharge status: Home Health | Payer: Medicare Other | Attending: Family Medicine | Admitting: Family Medicine

## 2022-08-21 DIAGNOSIS — K219 Gastro-esophageal reflux disease without esophagitis: Secondary | ICD-10-CM | POA: Diagnosis present

## 2022-08-21 DIAGNOSIS — Z8673 Personal history of transient ischemic attack (TIA), and cerebral infarction without residual deficits: Secondary | ICD-10-CM

## 2022-08-21 DIAGNOSIS — E785 Hyperlipidemia, unspecified: Secondary | ICD-10-CM | POA: Diagnosis present

## 2022-08-21 DIAGNOSIS — Z79899 Other long term (current) drug therapy: Secondary | ICD-10-CM

## 2022-08-21 DIAGNOSIS — N186 End stage renal disease: Secondary | ICD-10-CM

## 2022-08-21 DIAGNOSIS — I998 Other disorder of circulatory system: Secondary | ICD-10-CM | POA: Diagnosis not present

## 2022-08-21 DIAGNOSIS — D649 Anemia, unspecified: Secondary | ICD-10-CM | POA: Diagnosis present

## 2022-08-21 DIAGNOSIS — I743 Embolism and thrombosis of arteries of the lower extremities: Secondary | ICD-10-CM | POA: Diagnosis present

## 2022-08-21 DIAGNOSIS — Z7982 Long term (current) use of aspirin: Secondary | ICD-10-CM

## 2022-08-21 DIAGNOSIS — J4489 Other specified chronic obstructive pulmonary disease: Secondary | ICD-10-CM | POA: Diagnosis present

## 2022-08-21 DIAGNOSIS — I5042 Chronic combined systolic (congestive) and diastolic (congestive) heart failure: Secondary | ICD-10-CM | POA: Diagnosis present

## 2022-08-21 DIAGNOSIS — Z800 Family history of malignant neoplasm of digestive organs: Secondary | ICD-10-CM

## 2022-08-21 DIAGNOSIS — E876 Hypokalemia: Secondary | ICD-10-CM | POA: Diagnosis present

## 2022-08-21 DIAGNOSIS — Z91148 Patient's other noncompliance with medication regimen for other reason: Secondary | ICD-10-CM

## 2022-08-21 DIAGNOSIS — D72829 Elevated white blood cell count, unspecified: Secondary | ICD-10-CM

## 2022-08-21 DIAGNOSIS — I70222 Atherosclerosis of native arteries of extremities with rest pain, left leg: Principal | ICD-10-CM | POA: Diagnosis present

## 2022-08-21 DIAGNOSIS — Z8249 Family history of ischemic heart disease and other diseases of the circulatory system: Secondary | ICD-10-CM

## 2022-08-21 DIAGNOSIS — T8110XA Postprocedural shock unspecified, initial encounter: Secondary | ICD-10-CM | POA: Diagnosis not present

## 2022-08-21 DIAGNOSIS — K7460 Unspecified cirrhosis of liver: Secondary | ICD-10-CM | POA: Diagnosis present

## 2022-08-21 DIAGNOSIS — Z5982 Transportation insecurity: Secondary | ICD-10-CM

## 2022-08-21 DIAGNOSIS — Z7902 Long term (current) use of antithrombotics/antiplatelets: Secondary | ICD-10-CM

## 2022-08-21 DIAGNOSIS — Y832 Surgical operation with anastomosis, bypass or graft as the cause of abnormal reaction of the patient, or of later complication, without mention of misadventure at the time of the procedure: Secondary | ICD-10-CM | POA: Diagnosis not present

## 2022-08-21 DIAGNOSIS — I272 Pulmonary hypertension, unspecified: Secondary | ICD-10-CM | POA: Diagnosis present

## 2022-08-21 DIAGNOSIS — I132 Hypertensive heart and chronic kidney disease with heart failure and with stage 5 chronic kidney disease, or end stage renal disease: Secondary | ICD-10-CM | POA: Diagnosis present

## 2022-08-21 DIAGNOSIS — Z8616 Personal history of COVID-19: Secondary | ICD-10-CM

## 2022-08-21 DIAGNOSIS — I5020 Unspecified systolic (congestive) heart failure: Secondary | ICD-10-CM | POA: Diagnosis present

## 2022-08-21 DIAGNOSIS — Z87891 Personal history of nicotine dependence: Secondary | ICD-10-CM

## 2022-08-21 DIAGNOSIS — Z992 Dependence on renal dialysis: Secondary | ICD-10-CM

## 2022-08-21 LAB — I-STAT CHEM 8, ED
BUN: 27 mg/dL — ABNORMAL HIGH (ref 8–23)
Calcium, Ion: 1.09 mmol/L — ABNORMAL LOW (ref 1.15–1.40)
Chloride: 95 mmol/L — ABNORMAL LOW (ref 98–111)
Creatinine, Ser: 6.2 mg/dL — ABNORMAL HIGH (ref 0.61–1.24)
Glucose, Bld: 121 mg/dL — ABNORMAL HIGH (ref 70–99)
HCT: 45 % (ref 39.0–52.0)
Hemoglobin: 15.3 g/dL (ref 13.0–17.0)
Potassium: 3.6 mmol/L (ref 3.5–5.1)
Sodium: 136 mmol/L (ref 135–145)
TCO2: 30 mmol/L (ref 22–32)

## 2022-08-21 LAB — CBC WITH DIFFERENTIAL/PLATELET
Abs Immature Granulocytes: 0.13 10*3/uL — ABNORMAL HIGH (ref 0.00–0.07)
Basophils Absolute: 0.1 10*3/uL (ref 0.0–0.1)
Basophils Relative: 1 %
Eosinophils Absolute: 0.4 10*3/uL (ref 0.0–0.5)
Eosinophils Relative: 2 %
HCT: 42.4 % (ref 39.0–52.0)
Hemoglobin: 13.2 g/dL (ref 13.0–17.0)
Immature Granulocytes: 1 %
Lymphocytes Relative: 7 %
Lymphs Abs: 1.5 10*3/uL (ref 0.7–4.0)
MCH: 33.5 pg (ref 26.0–34.0)
MCHC: 31.1 g/dL (ref 30.0–36.0)
MCV: 107.6 fL — ABNORMAL HIGH (ref 80.0–100.0)
Monocytes Absolute: 0.9 10*3/uL (ref 0.1–1.0)
Monocytes Relative: 4 %
Neutro Abs: 18.3 10*3/uL — ABNORMAL HIGH (ref 1.7–7.7)
Neutrophils Relative %: 85 %
Platelets: 232 10*3/uL (ref 150–400)
RBC: 3.94 MIL/uL — ABNORMAL LOW (ref 4.22–5.81)
RDW: 16.3 % — ABNORMAL HIGH (ref 11.5–15.5)
WBC: 21.4 10*3/uL — ABNORMAL HIGH (ref 4.0–10.5)
nRBC: 0 % (ref 0.0–0.2)

## 2022-08-21 MED ORDER — ROSUVASTATIN CALCIUM 5 MG PO TABS
10.0000 mg | ORAL_TABLET | Freq: Every day | ORAL | Status: DC
  Administered 2022-08-23 – 2022-08-24 (×2): 10 mg via ORAL
  Filled 2022-08-21 (×2): qty 2

## 2022-08-21 MED ORDER — DIALYVITE/ZINC PO TABS: 1.0000 | ORAL_TABLET | Freq: Every day | ORAL | Status: DC

## 2022-08-21 MED ORDER — OXYCODONE HCL 5 MG PO TABS
5.0000 mg | ORAL_TABLET | Freq: Once | ORAL | Status: AC
  Administered 2022-08-21: 5 mg via ORAL
  Filled 2022-08-21: qty 1

## 2022-08-21 MED ORDER — HEPARIN BOLUS VIA INFUSION
3000.0000 [IU] | Freq: Once | INTRAVENOUS | Status: AC
  Administered 2022-08-22: 3000 [IU] via INTRAVENOUS
  Filled 2022-08-21: qty 3000

## 2022-08-21 MED ORDER — FENTANYL CITRATE PF 50 MCG/ML IJ SOSY
50.0000 ug | PREFILLED_SYRINGE | Freq: Once | INTRAMUSCULAR | Status: AC
  Administered 2022-08-21: 50 ug via INTRAVENOUS
  Filled 2022-08-21: qty 1

## 2022-08-21 MED ORDER — HYDROMORPHONE HCL 1 MG/ML IJ SOLN: 1.0000 mg | Freq: Once | INTRAMUSCULAR | Status: DC

## 2022-08-21 MED ORDER — HEPARIN (PORCINE) 25000 UT/250ML-% IV SOLN
1100.0000 [IU]/h | INTRAVENOUS | Status: DC
  Administered 2022-08-22: 1100 [IU]/h via INTRAVENOUS
  Filled 2022-08-21: qty 250

## 2022-08-21 MED ORDER — SODIUM CHLORIDE 0.9 % IV SOLN: 250.0000 mL | INTRAVENOUS | Status: DC | PRN

## 2022-08-21 MED ORDER — CALCIUM ACETATE (PHOS BINDER) 667 MG PO CAPS
1334.0000 mg | ORAL_CAPSULE | Freq: Three times a day (TID) | ORAL | Status: DC
  Filled 2022-08-21 (×2): qty 2

## 2022-08-21 MED ORDER — RENA-VITE PO TABS
1.0000 | ORAL_TABLET | Freq: Every day | ORAL | Status: DC
  Administered 2022-08-23 – 2022-08-24 (×2): 1 via ORAL
  Filled 2022-08-21 (×2): qty 1

## 2022-08-21 MED ORDER — SODIUM CHLORIDE 0.9 % IV BOLUS
1000.0000 mL | Freq: Once | INTRAVENOUS | Status: AC
  Administered 2022-08-21: 1000 mL via INTRAVENOUS

## 2022-08-21 NOTE — ED Provider Triage Note (Signed)
Emergency Medicine Provider Triage Evaluation Note  Albert Harris , a 67 y.o. male  was evaluated in triage.  Pt complains of left foot pain.  Began last night at around 10 PM.  It has gotten worse.  He called his vascular surgeon today and was encouraged to come to the ED for evaluation.  He has stents in bilateral lower extremities.  States the left foot feels cold as well.  Currently rates the pain a 9 out of 10.  Review of Systems  Positive: As above Negative: As above  Physical Exam  BP (!) 90/58   Pulse 72   SpO2 100%  Gen:   Awake, no distress   Resp:  Normal effort  MSK:   Moves extremities without difficulty  Other:  Palpable DP and PT pulses on the right.  Unable to palpate pulses on the left.  Unable to locate with Doppler as well.  Left foot cool to touch.  Right foot normal temperature  Medical Decision Making  Medically screening exam initiated at 6:06 PM.  Appropriate orders placed.  ANDREE UGALDE was informed that the remainder of the evaluation will be completed by another provider, this initial triage assessment does not replace that evaluation, and the importance of remaining in the ED until their evaluation is complete.  Concern for ischemic limb.  Patient will be the next person brought back   Albert Harris, [place] 08/21/22 1808

## 2022-08-21 NOTE — Assessment & Plan Note (Addendum)
HD TThSa. -Nephro following, appreciate recs -Continue Phoslo

## 2022-08-21 NOTE — H&P (View-Only) (Signed)
Hospital Consult    Reason for Consult:  left lower extremity rest pain Referring Physician:  Dr. Vanita Panda MRN #:  [id number]  History of Present Illness: This is a 67 y.o. male with history of end-stage renal disease on dialysis Tuesdays, Thursdays and Saturdays via right forearm AV graft.  He recently underwent stenting of left external iliac artery with Viabahn stents x 2 for rest pain of his left lower extremity with previous right lower extremity bypass.  Last night he redeveloped rest pain in the left foot and states that it was numb although the numbness is somewhat resolved he has had difficulty walking on the foot.  He does take aspirin.  Last dialysis was yesterday.  States that the pain is worse than before having stents placed.  Past Medical History:  Diagnosis Date   Acute metabolic encephalopathy [id number]   Acute on chronic diastolic CHF (congestive heart failure) ([place]) 12/15/2020   Acute respiratory disease due to COVID-19 virus 06/16/2020   Alcohol use disorder 06/30/2022   Anemia    Asthma    Bacteremia due to Gram-positive bacteria 05/09/2021   Carotid stenosis 09/12/2019   CHF (congestive heart failure) (HCC)    Claudication in peripheral vascular disease ([place]) 123456   Complication of anesthesia    " i HAVE A HARD TIME WAKING UP " (only one time)   COPD (chronic obstructive pulmonary disease) ([place])    COVID-19    positive on  06/16/20   Critical limb ischemia of right lower extremity (HCC)    ESRD on hemodialysis ([place])    t, th. sat dialysis   GERD (gastroesophageal reflux disease)    HLD (hyperlipidemia)    Hypertension    Hypertensive urgency 12/24/2016   Lower GI bleed    Nausea vomiting and diarrhea 08/13/2022   Peripheral vascular disease (HCC)    PFO with atrial septal aneurysm    by echo 11/2017   Pneumonia    Stroke [facility])    Surgical wound infection 09/20/2021   [name] (transient ischemic attack) 11/2017   Tobacco abuse    quit 07/17/19   Volume  overload 12/14/2020    Past Surgical History:  Procedure Laterality Date   ABDOMINAL AORTOGRAM W/LOWER EXTREMITY N/A 06/21/2020   Procedure: ABDOMINAL AORTOGRAM W/LOWER EXTREMITY;  Surgeon: Angelia Mould, MD;  Location: [place] [facility];  Service: Cardiovascular;  Laterality: N/A;   ABDOMINAL AORTOGRAM W/LOWER EXTREMITY Right 08/29/2021   Procedure: ABDOMINAL AORTOGRAM W/LOWER EXTREMITY;  Surgeon: Cherre Robins, MD;  Location: [place] [facility];  Service: Cardiovascular;  Laterality: Right;   ABDOMINAL AORTOGRAM W/LOWER EXTREMITY N/A 08/10/2022   Procedure: ABDOMINAL AORTOGRAM W/LOWER EXTREMITY;  Surgeon: Angelia Mould, MD;  Location: [place] [facility];  Service: Cardiovascular;  Laterality: N/A;   APPLICATION OF WOUND VAC Right 09/21/2021   Procedure: APPLICATION OF WOUND VAC;  Surgeon: Angelia Mould, MD;  Location: [place];  Service: Vascular;  Laterality: Right;   AV FISTULA PLACEMENT Left 03/28/2020   Procedure: ARTERIOVENOUS (AV) FISTULA CREATION LEFT;  Surgeon: [name], MD;  Location: [place];  Service: Vascular;  Laterality: Left;   AV FISTULA PLACEMENT Right 09/23/2020   Procedure: RIGHT Arm ARTERIOVENOUS GRAFT CREATION.;  Surgeon: Angelia Mould, MD;  Location: [place];  Service: Vascular;  Laterality: Right;   COLONOSCOPY     COLONOSCOPY WITH PROPOFOL N/A 06/27/2020   Procedure: COLONOSCOPY WITH PROPOFOL;  Surgeon: Carol Ada, MD;  Location: [place];  Service: Endoscopy;  Laterality:  N/A;   DRAINAGE AND CLOSURE OF LYMPHOCELE Right 09/21/2021   Procedure: EVACUATION OF LYMPHOCELE RIGHT GROIN;  Surgeon: Angelia Mould, MD;  Location: [place];  Service: Vascular;  Laterality: Right;   ENDARTERECTOMY FEMORAL Right 09/05/2021   Procedure: ENDARTERECTOMY FEMORAL;  Surgeon: Angelia Mould, MD;  Location: [place];  Service: Vascular;  Laterality: Right;   FEMORAL-POPLITEAL BYPASS GRAFT Right 09/05/2021   Procedure: RIGHT  FEMORAL-POPLITEAL ARTERY BYPASS WITH Spaphenous Vein;  Surgeon: Angelia Mould, MD;  Location: [place];  Service: Vascular;  Laterality: Right;   [place]  06/27/2020   Procedure: HEMOSTASIS CLIP PLACEMENT;  Surgeon: Carol Ada, MD;  Location: [place];  Service: Endoscopy;;   INSERTION OF DIALYSIS CATHETER Right 06/24/2020   Procedure: INSERTION OF TUNNELED  DIALYSIS CATHETER; Removal of temporary dialysis catheter right neck;  Surgeon: Angelia Mould, MD;  Location: [place];  Service: Vascular;  Laterality: Right;   IR PARACENTESIS  06/30/2022   LIGATION OF ARTERIOVENOUS  FISTULA Left 07/24/2020   Procedure: LIGATION OF LEFT BRACHIOCEPHALIC ARTERIOVENOUS  FISTULA;  Surgeon: [name], MD;  Location: [place];  Service: Vascular;  Laterality: Left;   PERIPHERAL VASCULAR INTERVENTION Right 06/21/2020   Procedure: PERIPHERAL VASCULAR INTERVENTION;  Surgeon: Angelia Mould, MD;  Location: [place] [facility];  Service: Cardiovascular;  Laterality: Right;   PERIPHERAL VASCULAR INTERVENTION Left 08/10/2022   Procedure: PERIPHERAL VASCULAR INTERVENTION;  Surgeon: Angelia Mould, MD;  Location: [place] [facility];  Service: Cardiovascular;  Laterality: Left;  External Illiac   TEE WITHOUT CARDIOVERSION N/A 05/13/2021   Procedure: TRANSESOPHAGEAL ECHOCARDIOGRAM (TEE);  Surgeon: Donato Heinz, MD;  Location: [facility];  Service: Cardiovascular;  Laterality: N/A;   VEIN HARVEST Right 09/05/2021   Procedure: RIGHT Saphenous VEIN HARVEST;  Surgeon: Angelia Mould, MD;  Location: [place];  Service: Vascular;  Laterality: Right;    Allergies  Allergen Reactions   Lidocaine-Prilocaine Rash    Caused red marks up and down arm    Prior to Admission medications   Medication Sig Start Date End Date Taking? Authorizing Provider  albuterol (VENTOLIN HFA) 108 (90 Base) MCG/ACT inhaler INHALE 1-2 PUFFS INTO THE LUNGS EVERY 6  (SIX) HOURS AS NEEDED FOR WHEEZING OR SHORTNESS OF BREATH. Patient not taking: Reported on 08/13/2022 07/02/20 07/22/22  Delora Fuel, MD  aspirin EC 81 MG tablet Take 1 tablet (81 mg total) by mouth daily. Swallow whole. Patient taking differently: Take 81 mg by mouth daily. 08/10/22 08/10/23  Angelia Mould, MD  B Complex-C-Zn-Folic Acid (DIALYVITE/ZINC) TABS Take 1 tablet by mouth daily. 04/25/22   [provider]  calcium acetate (PHOSLO) 667 MG capsule Take 2 capsule by mouth three times a day with meals Patient taking differently: Take 1,334 mg by mouth 3 (three) times daily with meals. 06/17/21     clopidogrel (PLAVIX) 75 MG tablet Take 1 tablet (75 mg total) by mouth daily. Patient not taking: Reported on 08/13/2022 08/10/22   Angelia Mould, MD  metoprolol succinate (TOPROL-XL) 25 MG 24 hr tablet Take 1 tablet (25 mg total) by mouth daily. 07/01/22   Precious Gilding, DO  rosuvastatin (CRESTOR) 10 MG tablet Take 1 tablet (10 mg total) by mouth daily. Patient not taking: Reported on 08/13/2022 08/10/22 08/10/23  Angelia Mould, MD  sacubitril-valsartan (ENTRESTO) 24-26 MG Take 1 tablet by mouth 2 (two) times daily. 07/01/22   Precious Gilding, DO    Social  History   Socioeconomic History   Marital status: Divorced    Spouse name: Not on file   Number of children: Not on file   Years of education: Not on file   Highest education level: Not on file  Occupational History   Not on file  Tobacco Use   Smoking status: Former    Packs/day: 1.00    Years: 50.00    Additional pack years: 0.00    Total pack years: 50.00    Types: Cigarettes    Quit date: 07/17/2019    Years since quitting: 3.0   Smokeless tobacco: Never  Vaping Use   Vaping Use: Never used  Substance and Sexual Activity   Alcohol use: Not Currently    Alcohol/week: 1.0 - 3.0 standard drink of alcohol    Types: 1 - 3 Cans of beer per week   Drug use: No   Sexual activity: Not Currently  Other Topics  Concern   Not on file  Social History Narrative   Married   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   Social Determinants of Health   Financial Resource Strain: [place]  (09/12/2019)   Overall Financial Resource Strain (CARDIA)    Difficulty of Paying Living Expenses: Not hard at all  Food Insecurity: Food Insecurity Present (07/23/2022)   Hunger Vital Sign    Worried About Running Out of Food in the Last Year: Sometimes true    Ran Out of Food in the Last Year: Sometimes true  Transportation Needs: Unmet Transportation Needs (07/23/2022)   PRAPARE - Hydrologist (Medical): Yes    Lack of Transportation (Non-Medical): No  Physical Activity: Not on file  Stress: No Stress Concern Present (09/12/2019)   [place]    Feeling of Stress : Not at all  Social Connections: Not on file  Intimate Partner Violence: Not At Risk (07/23/2022)   Humiliation, Afraid, Rape, and Kick questionnaire    Fear of Current or Ex-Partner: No    Emotionally Abused: No    Physically Abused: No    Sexually Abused: No     Family History  Problem Relation Age of Onset   Hypertension Mother    Colon cancer Father    Stomach cancer Brother     Review of Systems  Constitutional: Negative.   HENT: Negative.    Eyes: Negative.   Respiratory: Negative.    Cardiovascular: Negative.   Musculoskeletal:        Leg pain and weakness  Skin: Negative.   Neurological:  Positive for sensory change and focal weakness.  Psychiatric/Behavioral: Negative.        Physical Examination  Vitals:   08/21/22 1813 08/21/22 1845  BP:    Pulse:  68  Resp:  19  Temp: 97.6 F (36.4 C)   SpO2:  100%   Body mass index is 25.09 kg/m.  Physical Exam HENT:     Head: Normocephalic.     Nose: Nose normal.  Eyes:     Pupils: Pupils are equal, round, and reactive to light.  Cardiovascular:     Pulses:          Femoral  pulses are 3+ on the right side and 0 on the left side.      Dorsalis pedis pulses are detected w/ Doppler on the right side and 0 on the left side.       Posterior tibial pulses are detected  w/ Doppler on the right side and 0 on the left side.  Pulmonary:     Effort: Pulmonary effort is normal.  Abdominal:     General: Abdomen is flat.     Palpations: Abdomen is soft.  Musculoskeletal:     Right lower leg: No edema.     Left lower leg: No edema.     Comments: Left foot is cooler than right although not mottled does not grossly appear ischemic but does have significantly delayed capillary refill  Skin:    Capillary Refill: Capillary refill takes more than 3 seconds.  Neurological:     General: No focal deficit present.     Mental Status: He is alert.  Psychiatric:        Mood and Affect: Mood normal.      CBC    Component Value Date/Time   WBC 21.4 (H) 08/21/2022 1829   RBC 3.94 (L) 08/21/2022 1829   HGB 15.3 08/21/2022 1842   HGB 10.4 (L) 06/18/2021 1431   HCT 45.0 08/21/2022 1842   HCT 31.6 (L) 06/18/2021 1431   PLT 232 08/21/2022 1829   PLT 142 (L) 06/18/2021 1431   MCV 107.6 (H) 08/21/2022 1829   MCV 96 06/18/2021 1431   MCH 33.5 08/21/2022 1829   MCHC 31.1 08/21/2022 1829   RDW 16.3 (H) 08/21/2022 1829   RDW 14.3 06/18/2021 1431   LYMPHSABS 1.5 08/21/2022 1829   LYMPHSABS 1.5 06/18/2021 1431   MONOABS 0.9 08/21/2022 1829   EOSABS 0.4 08/21/2022 1829   EOSABS 1.0 (H) 06/18/2021 1431   BASOSABS 0.1 08/21/2022 1829   BASOSABS 0.1 06/18/2021 1431    BMET    Component Value Date/Time   NA 136 08/21/2022 1842   NA 141 06/18/2021 1431   K 3.6 08/21/2022 1842   CL 95 (L) 08/21/2022 1842   CO2 25 08/15/2022 0324   GLUCOSE 121 (H) 08/21/2022 1842   BUN 27 (H) 08/21/2022 1842   BUN 20 06/18/2021 1431   CREATININE 6.20 (H) 08/21/2022 1842   CREATININE 1.15 02/19/2017 1049   CALCIUM 8.5 (L) 08/15/2022 0324   CALCIUM 8.5 (L) 06/10/2020 0900   GFRNONAA 6 (L)  08/15/2022 0324   GFRAA 6 (L) 03/04/2020 1045    COAGS: Lab Results  Component Value Date   INR 1.2 08/12/2022   INR 1.3 (H) 06/29/2022   INR 1.3 (H) 06/28/2022     Vascular Imaging:   Previous angio reviewed   ASSESSMENT/PLAN: This is a 67 y.o. male recently underwent stenting of his left external iliac artery for chronic left lower extremity limb threatening ischemia.  He was subsequently readmitted was noted to have bounding femoral pulses bilaterally.  He now has recurrent left foot pain which began last night and does not have a femoral pulse.  His foot does not appear immediately threatened.  I reviewed his most recent CT scan as well as angiography and appears patient will require left common femoral endarterectomy with aortoiliac thrombectomy and aortogram with possible restenting with a possible need for femorofemoral bypass from the right to the left to revascularize the left lower extremity.  This will be done for tomorrow in the OR.  He will need to be n.p.o. past midnight.  I have recommended heparin drip and admission to the hospitalist given his underlying chronic medical conditions.  Eliz Nigg C. Donzetta Matters, MD [facility] Office: [phone] Pager: [phone]

## 2022-08-21 NOTE — H&P (Cosign Needed Addendum)
Hospital Admission History and Physical Service Pager: [phone]  Patient name: Albert Harris Medical record number: [id number] Date of Birth: 06-22-1955 Age: 67 y.o. Gender: male  Primary Care Provider: Kerin Perna, NP Consultants: VVS, Nephrology Code Status: Full Code Preferred Emergency Contact:  Contact Information     Name Relation Home Work [place] Spouse [phone]  [phone]   [facility] Daughter   [phone]   [name],[name] Daughter   [phone]        Chief Complaint: leg pain  Assessment and Plan: Albert Harris is a 67 y.o. male presenting with acute pain, paresthesias, weakness of left foot. Patient has notable history of left external iliac artery stenting on 08/10/22 and previous right lower extremity bypass. Clinical diagnosis of acute limb ischemia given decreased left lower extremity pulses and presentation. This is likely precipitated by non-compliance with Plavix.  * Acute lower limb ischemia Recent stenting of the L external iliax artery now with sudden onset of pain at rest and diminished pulses on that side. Already seen by vascular with plans for the OR tomorrow. Does report good adherence with his aspirin prior to onset. - Admit to med-surg - CTA is pending - VVS consulted, surgery tomorrow - NPO ahead of surgery - Heparin gtt - PRN Dilaudid for pain - Neurovascular checks q4h  Leukocytosis WBC elevated to 21.4. Patient does not have clinical signs and symptoms of infection. 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 Reassuringly no diarrhea since prior hospitalization. Possibly stress reaction to limb ischemia. -Monitor fever curve -Trend CBC  ESRD on dialysis [facility]) Dialysis schedule TThSa. Has not missed a session. Appears euvolemic on exam. Creatine 6.20. -Consult Nephrology AM, for inpatient dialysis while hospitalized. -Continue Phoslo  HFrEF (heart failure with reduced ejection fraction) (HCC) Appears  euvolemic on exam. Respiratory status stable. Home medications: Entresto, Metoprolol. -Hold Entreso, Metoprolol before surgery -Restart as appropriate   FEN/GI: NPO at midnight VTE Prophylaxis: Heparin gtt   Disposition: Med-Surg  History of Present Illness:  Albert Harris is a 67 y.o. male presenting with left foot pain, weakness, numbness. Started last night, states he felt like his leg is "dead." Pain worse in foot but present throughout leg. Worse with walking. States he has been taking his aspirin. Not taking Plavix. Denies chest pain or shortness of breath.  Recently admitted after stenting surgery for colitis. Has history recurrent C diff. Last bowel movement yesterday. Denies diarrhea.   In the ED, vitals signs were stable. Vascular surgery was consulted, recommendation for surgery in AM. Received x1 NS 1L bolus, and 28mcg of fentanyl.  Review Of Systems: Per HPI.  Pertinent Past Medical History: ESRD on dialysis TTS  HFrEF (EF35-40%)  Liver Cirrhosis  COPD Hx of stroke PAD Hx of lymphedema of R leg  Remainder reviewed in history tab.   Pertinent Past Surgical History: BL LE femoral bypass    08/10/22 left external iliac stent placed.   Remainder reviewed in history tab.  Pertinent Social History: Tobacco use: Former 2 ppd, quit 2 years ago  Alcohol use: Former, 6 pck of beer per day from [age] yo  Other Substance use: Denies Lives with wife  Pertinent Family History: Mother: HTN Father: Colon cancer   Remainder reviewed in history tab.   Important Outpatient Medications: Amlodipine Metoprolol Phoslo Dialyvite Entresto Aspirin Crestor Plavix - not taking  Remainder reviewed in medication history.   Objective: BP (!) 145/69   Pulse 72   Temp 97.6  F (36.4 C) (Oral)   Resp (!) 26   Ht 5\' 8"  (1.727 m)   Wt 74.8 kg   SpO2 100%   BMI 25.09 kg/m  Exam: General: A&O, NAD, lying comfortably in hospital bed HEENT: No sign of trauma, EOM grossly  intact, moist mucous membranes Cardiac: RRR, no m/r/g Respiratory: CTAB, normal WOB, no w/c/r GI: Soft, NTTP, non-distended, no rebound or guarding Extremities: NTTP, no peripheral edema, left dorsalis pedis pulse decreased 0-1+ weak, left foot cool to touch to mid tibia MSK: plantarflexion left 4/5 left dorsiflexion 4/5, right plantarflexion 5/5, right dorsiflexion 5/5 Neuro: Moves all four extremities appropriately. Left lower extremity decreased sensation Psych: Appropriate mood and affect   Labs:  CBC BMET  Recent Labs  Lab 08/21/22 1829 08/21/22 1842  WBC 21.4*  --   HGB 13.2 15.3  HCT 42.4 45.0  PLT 232  --    Recent Labs  Lab 08/15/22 0324 08/21/22 1842  NA 136 136  K 4.3 3.6  CL 95* 95*  CO2 25  --   BUN 41* 27*  CREATININE 8.78* 6.20*  GLUCOSE 91 121*  CALCIUM 8.5*  --       EKG: Sinus rhythm, possible LVH   Imaging Studies Performed:  None.  Salvadore Oxford, MD 08/21/2022, 10:25 PM PGY-1, [place] Intern pager: [phone], text pages welcome Secure chat group [place]

## 2022-08-21 NOTE — ED Notes (Signed)
Attempted to gain IV access. Unable to at this time. Primary RN made aware.

## 2022-08-21 NOTE — ED Triage Notes (Signed)
Pt c/o left foot pain and decreased blood flow worsening last night. Stents placed bilaterally 3/4. T, Th, S dialysis - no missed sessions.

## 2022-08-21 NOTE — Assessment & Plan Note (Addendum)
Improving leukocytosis, afebrile with stable vitals.  -Trend CBC

## 2022-08-21 NOTE — ED Notes (Signed)
CT Tech requested RN assess patient's IV. Patient's IV site noted to be edematous and cool. Patient reported some discomfort to site. IV fluids were stopped and IV was removed. MD made aware.

## 2022-08-21 NOTE — ED Notes (Addendum)
ED TO INPATIENT HANDOFF REPORT  ED Nurse Name and Phone #: [phone] Kienna Moncada, RN  S Name/Age/Gender Albert Harris 67 y.o. male Room/Bed: TRAAC/TRAAC  Code Status   Code Status: Full Code  Home/SNF/Other Home Patient oriented to: self, place, time, and situation Is this baseline? Yes   Triage Complete: Triage complete  Chief Complaint Acute lower limb ischemia [I99.8]  Triage Note Pt c/o left foot pain and decreased blood flow worsening last night. Stents placed bilaterally 3/4. T, Th, S dialysis - no missed sessions.    Allergies Allergies  Allergen Reactions   Lidocaine-Prilocaine Rash    Caused red marks up and down arm    Level of Care/Admitting Diagnosis ED Disposition     ED Disposition  Admit   Condition  --   Comment  Hospital Area: [place] [[id number]]  Level of Care: Med-Surg [16]  May place patient in observation at [facility] or [place] if equivalent level of care is available:: No  Covid Evaluation: Asymptomatic - no recent exposure (last 10 days) testing not required  Diagnosis: Acute lower limb ischemia [id number]  Admitting Physician: Salvadore Oxford [id number]  Attending Physician: Lenoria Chime [id number]          B Medical/Surgery History Past Medical History:  Diagnosis Date   Acute metabolic encephalopathy [id number]   Acute on chronic diastolic CHF (congestive heart failure) ([place]) 12/15/2020   Acute respiratory disease due to COVID-19 virus 06/16/2020   Alcohol use disorder 06/30/2022   Anemia    Asthma    Bacteremia due to Gram-positive bacteria 05/09/2021   Carotid stenosis 09/12/2019   CHF (congestive heart failure) (HCC)    Claudication in peripheral vascular disease ([place]) 123456   Complication of anesthesia    " i HAVE A HARD TIME WAKING UP " (only one time)   COPD (chronic obstructive pulmonary disease) ([place])    COVID-19    positive on  06/16/20   Critical limb ischemia of right lower extremity ([place])     ESRD on hemodialysis ([place])    t, th. sat dialysis   GERD (gastroesophageal reflux disease)    HLD (hyperlipidemia)    Hypertension    Hypertensive urgency 12/24/2016   Lower GI bleed    Nausea vomiting and diarrhea 08/13/2022   Peripheral vascular disease (HCC)    PFO with atrial septal aneurysm    by echo 11/2017   Pneumonia    Stroke [facility])    Surgical wound infection 09/20/2021   [name] (transient ischemic attack) 11/2017   Tobacco abuse    quit 07/17/19   Volume overload 12/14/2020   Past Surgical History:  Procedure Laterality Date   ABDOMINAL AORTOGRAM W/LOWER EXTREMITY N/A 06/21/2020   Procedure: ABDOMINAL AORTOGRAM W/LOWER EXTREMITY;  Surgeon: Angelia Mould, MD;  Location: [place] [facility];  Service: Cardiovascular;  Laterality: N/A;   ABDOMINAL AORTOGRAM W/LOWER EXTREMITY Right 08/29/2021   Procedure: ABDOMINAL AORTOGRAM W/LOWER EXTREMITY;  Surgeon: Cherre Robins, MD;  Location: [place] [facility];  Service: Cardiovascular;  Laterality: Right;   ABDOMINAL AORTOGRAM W/LOWER EXTREMITY N/A 08/10/2022   Procedure: ABDOMINAL AORTOGRAM W/LOWER EXTREMITY;  Surgeon: Angelia Mould, MD;  Location: [place] [facility];  Service: Cardiovascular;  Laterality: N/A;   APPLICATION OF WOUND VAC Right 09/21/2021   Procedure: APPLICATION OF WOUND VAC;  Surgeon: Angelia Mould, MD;  Location: [place];  Service: Vascular;  Laterality: Right;   AV FISTULA PLACEMENT Left 03/28/2020   Procedure: ARTERIOVENOUS (  AV) FISTULA CREATION LEFT;  Surgeon: [name], MD;  Location: [place];  Service: Vascular;  Laterality: Left;   AV FISTULA PLACEMENT Right 09/23/2020   Procedure: RIGHT Arm ARTERIOVENOUS GRAFT CREATION.;  Surgeon: Angelia Mould, MD;  Location: [place];  Service: Vascular;  Laterality: Right;   COLONOSCOPY     COLONOSCOPY WITH PROPOFOL N/A 06/27/2020   Procedure: COLONOSCOPY WITH PROPOFOL;  Surgeon: Carol Ada, MD;  Location: [place];  Service:  Endoscopy;  Laterality: N/A;   DRAINAGE AND CLOSURE OF LYMPHOCELE Right 09/21/2021   Procedure: EVACUATION OF LYMPHOCELE RIGHT GROIN;  Surgeon: Angelia Mould, MD;  Location: [place];  Service: Vascular;  Laterality: Right;   ENDARTERECTOMY FEMORAL Right 09/05/2021   Procedure: ENDARTERECTOMY FEMORAL;  Surgeon: Angelia Mould, MD;  Location: [place];  Service: Vascular;  Laterality: Right;   FEMORAL-POPLITEAL BYPASS GRAFT Right 09/05/2021   Procedure: RIGHT FEMORAL-POPLITEAL ARTERY BYPASS WITH Spaphenous Vein;  Surgeon: Angelia Mould, MD;  Location: [place];  Service: Vascular;  Laterality: Right;   [place]  06/27/2020   Procedure: HEMOSTASIS CLIP PLACEMENT;  Surgeon: Carol Ada, MD;  Location: [place];  Service: Endoscopy;;   INSERTION OF DIALYSIS CATHETER Right 06/24/2020   Procedure: INSERTION OF TUNNELED  DIALYSIS CATHETER; Removal of temporary dialysis catheter right neck;  Surgeon: Angelia Mould, MD;  Location: [place];  Service: Vascular;  Laterality: Right;   IR PARACENTESIS  06/30/2022   LIGATION OF ARTERIOVENOUS  FISTULA Left 07/24/2020   Procedure: LIGATION OF LEFT BRACHIOCEPHALIC ARTERIOVENOUS  FISTULA;  Surgeon: [name], MD;  Location: [place];  Service: Vascular;  Laterality: Left;   PERIPHERAL VASCULAR INTERVENTION Right 06/21/2020   Procedure: PERIPHERAL VASCULAR INTERVENTION;  Surgeon: Angelia Mould, MD;  Location: [place] [facility];  Service: Cardiovascular;  Laterality: Right;   PERIPHERAL VASCULAR INTERVENTION Left 08/10/2022   Procedure: PERIPHERAL VASCULAR INTERVENTION;  Surgeon: Angelia Mould, MD;  Location: [place] [facility];  Service: Cardiovascular;  Laterality: Left;  External Illiac   TEE WITHOUT CARDIOVERSION N/A 05/13/2021   Procedure: TRANSESOPHAGEAL ECHOCARDIOGRAM (TEE);  Surgeon: Donato Heinz, MD;  Location: [facility] ENDOSCOPY;  Service: Cardiovascular;  Laterality:  N/A;   VEIN HARVEST Right 09/05/2021   Procedure: RIGHT Saphenous VEIN HARVEST;  Surgeon: Angelia Mould, MD;  Location: [facility] OR;  Service: Vascular;  Laterality: Right;     A IV Location/Drains/Wounds Patient Lines/Drains/Airways Status     Active Line/Drains/Airways     None            Intake/Output Last 24 hours No intake or output data in the 24 hours ending 08/21/22 2201  Labs/Imaging Results for orders placed or performed during the hospital encounter of 08/21/22 (from the past 48 hour(s))  CBC with Differential     Status: Abnormal   Collection Time: 08/21/22  6:29 PM  Result Value Ref Range   WBC 21.4 (H) 4.0 - 10.5 K/uL   RBC 3.94 (L) 4.22 - 5.81 MIL/uL   Hemoglobin 13.2 13.0 - 17.0 g/dL   HCT 42.4 39.0 - 52.0 %   MCV 107.6 (H) 80.0 - 100.0 fL   MCH 33.5 26.0 - 34.0 pg   MCHC 31.1 30.0 - 36.0 g/dL   RDW 16.3 (H) 11.5 - 15.5 %   Platelets 232 150 - 400 K/uL   nRBC 0.0 0.0 - 0.2 %   Neutrophils Relative % 85 %   Neutro Abs 18.3 (  H) 1.7 - 7.7 K/uL   Lymphocytes Relative 7 %   Lymphs Abs 1.5 0.7 - 4.0 K/uL   Monocytes Relative 4 %   Monocytes Absolute 0.9 0.1 - 1.0 K/uL   Eosinophils Relative 2 %   Eosinophils Absolute 0.4 0.0 - 0.5 K/uL   Basophils Relative 1 %   Basophils Absolute 0.1 0.0 - 0.1 K/uL   Immature Granulocytes 1 %   Abs Immature Granulocytes 0.13 (H) 0.00 - 0.07 K/uL    Comment: Performed at [facility] [address] [place], [place] [address]  I-stat chem 8, ED     Status: Abnormal   Collection Time: 08/21/22  6:42 PM  Result Value Ref Range   Sodium 136 135 - 145 mmol/L   Potassium 3.6 3.5 - 5.1 mmol/L   Chloride 95 (L) 98 - 111 mmol/L   BUN 27 (H) 8 - 23 mg/dL   Creatinine, Ser 6.20 (H) 0.61 - 1.24 mg/dL   Glucose, Bld 121 (H) 70 - 99 mg/dL    Comment: Glucose reference range applies only to samples taken after fasting for at least 8 hours.   Calcium, Ion 1.09 (L) 1.15 - 1.40 mmol/L   TCO2 30 22 - 32 mmol/L    Hemoglobin 15.3 13.0 - 17.0 g/dL   HCT 45.0 39.0 - 52.0 %   No results found.  Pending Labs Unresulted Labs (From admission, onward)     Start     Ordered   08/22/22 0500  Heparin level (unfractionated)  Daily,   R      08/21/22 2115   08/22/22 0500  CBC  Daily,   R      08/21/22 2115   08/22/22 [id number]  Basic metabolic panel  Tomorrow morning,   R        08/21/22 2152            Vitals/Pain Today's Vitals   08/21/22 1845 08/21/22 1935 08/21/22 1941 08/21/22 2045  BP:    (!) 145/69  Pulse: 68   72  Resp: 19   (!) 26  Temp:      TempSrc:      SpO2: 100%   100%  Weight:      Height:      PainSc:  10-Worst pain ever 10-Worst pain ever     Isolation Precautions No active isolations  Medications Medications  heparin ADULT infusion 100 units/mL (25000 units/254mL) (has no administration in time range)  heparin bolus via infusion 3,000 Units (has no administration in time range)  HYDROmorphone (DILAUDID) injection 1 mg (has no administration in time range)  calcium acetate (PHOSLO) capsule 1,334 mg (has no administration in time range)  0.9 %  sodium chloride infusion (has no administration in time range)  multivitamin (RENA-VIT) tablet 1 tablet (has no administration in time range)  sodium chloride 0.9 % bolus 1,000 mL (0 mLs Intravenous Stopped 08/21/22 1945)  fentaNYL (SUBLIMAZE) injection 50 mcg (50 mcg Intravenous Given 08/21/22 1940)    Mobility walks     Focused Assessments    R Recommendations: See Admitting Provider Note  Report given to:   Additional Notes: Patient has hx of clots, rrestricted Right arm d/t dialysis. A&Ox4, IV team consult in for access d/t infiltration of previous lines. NPO at midnight. OR in AM

## 2022-08-21 NOTE — ED Provider Notes (Signed)
[facility] Provider Note   CSN: [id number] Arrival date & time: 08/21/22  1742     History  Chief Complaint  Patient presents with   Ischemic Limb    Albert Harris is a 67 y.o. male.  HPI Patient presents 6 days after discharge, now with concern for left foot pain.  Patient has multiple medical problems, and during that hospitalization had vascular procedure with left external iliac artery addressed by our vascular team. He notes that after the procedure, and upon returning home he was doing well, but within the past 24 hours he has developed heaviness, pain, difficulty with walking secondary to pain in the foot.  No other systemic complaints.  He has been taking his medication as directed.    Home Medications Prior to Admission medications   Medication Sig Start Date End Date Taking? Authorizing Provider  albuterol (VENTOLIN HFA) 108 (90 Base) MCG/ACT inhaler INHALE 1-2 PUFFS INTO THE LUNGS EVERY 6 (SIX) HOURS AS NEEDED FOR WHEEZING OR SHORTNESS OF BREATH. Patient not taking: Reported on 08/13/2022 07/02/20 07/22/22  Delora Fuel, MD  aspirin EC 81 MG tablet Take 1 tablet (81 mg total) by mouth daily. Swallow whole. Patient taking differently: Take 81 mg by mouth daily. 08/10/22 08/10/23  Angelia Mould, MD  B Complex-C-Zn-Folic Acid (DIALYVITE/ZINC) TABS Take 1 tablet by mouth daily. 04/25/22   [provider]  calcium acetate (PHOSLO) 667 MG capsule Take 2 capsule by mouth three times a day with meals Patient taking differently: Take 1,334 mg by mouth 3 (three) times daily with meals. 06/17/21     clopidogrel (PLAVIX) 75 MG tablet Take 1 tablet (75 mg total) by mouth daily. Patient not taking: Reported on 08/13/2022 08/10/22   Angelia Mould, MD  metoprolol succinate (TOPROL-XL) 25 MG 24 hr tablet Take 1 tablet (25 mg total) by mouth daily. 07/01/22   Precious Gilding, DO  rosuvastatin (CRESTOR) 10 MG tablet Take 1 tablet  (10 mg total) by mouth daily. Patient not taking: Reported on 08/13/2022 08/10/22 08/10/23  Angelia Mould, MD  sacubitril-valsartan (ENTRESTO) 24-26 MG Take 1 tablet by mouth 2 (two) times daily. 07/01/22   Precious Gilding, DO      Allergies    Lidocaine-prilocaine    Review of Systems   Review of Systems  All other systems reviewed and are negative.   Physical Exam Updated Vital Signs BP (!) 145/69   Pulse 72   Temp 97.6 F (36.4 C) (Oral)   Resp (!) 26   Ht 5\' 8"  (1.727 m)   Wt 74.8 kg   SpO2 100%   BMI 25.09 kg/m  Physical Exam Vitals and nursing note reviewed.  Constitutional:      General: He is not in acute distress.    Appearance: He is well-developed. He is ill-appearing. He is not toxic-appearing or diaphoretic.  HENT:     Head: Normocephalic and atraumatic.  Eyes:     Conjunctiva/sclera: Conjunctivae normal.  Cardiovascular:     Rate and Rhythm: Normal rate and regular rhythm.     Comments: No appreciable pulse dorsalis pedis or posterior tibial left foot.  With color Doppler a brief flash is appreciable posterior tibial, but no Doppler sounds are elicited. Pulmonary:     Effort: Pulmonary effort is normal. No respiratory distress.     Breath sounds: No stridor.  Abdominal:     General: There is no distension.  Skin:    General: Skin  is warm and dry.     Coloration: Skin is pale.  Neurological:     Mental Status: He is alert and oriented to person, place, and time.     ED Results / Procedures / Treatments   Labs (all labs ordered are listed, but only abnormal results are displayed) Labs Reviewed  CBC WITH DIFFERENTIAL/PLATELET - Abnormal; Notable for the following components:      Result Value   WBC 21.4 (*)    RBC 3.94 (*)    MCV 107.6 (*)    RDW 16.3 (*)    Neutro Abs 18.3 (*)    Abs Immature Granulocytes 0.13 (*)    All other components within normal limits  I-STAT CHEM 8, ED - Abnormal; Notable for the following components:   Chloride 95  (*)    BUN 27 (*)    Creatinine, Ser 6.20 (*)    Glucose, Bld 121 (*)    Calcium, Ion 1.09 (*)    All other components within normal limits    EKG EKG Interpretation  Date/Time:  Friday August 21 2022 17:56:51 EDT Ventricular Rate:  69 PR Interval:  168 QRS Duration: 100 QT Interval:  468 QTC Calculation: 501 R Axis:   9 Text Interpretation: Normal sinus rhythm Left ventricular hypertrophy with repolarization abnormality ( Sokolow-Lyon , Cornell product ) Prolonged QT Abnormal ECG Confirmed by Carmin Muskrat [phone]) on 08/21/2022 9:01:30 PM  Radiology No results found.  Procedures Procedures    Medications Ordered in ED Medications  sodium chloride 0.9 % bolus 1,000 mL (0 mLs Intravenous Stopped 08/21/22 1945)  fentaNYL (SUBLIMAZE) injection 50 mcg (50 mcg Intravenous Given 08/21/22 1940)    ED Course/ Medical Decision Making/ A&P                             Medical Decision Making Adult male with vasculopathy, prior renal dysfunction, prior smoking, COPD, and recent vascular procedure presents with left foot that is cold, painful, without appreciable pulses. Concern for vascular compromise.  No early evidence for concurrent infection or other phenomenon.  Patient placed on continuous monitoring, labs sent, vascular surgery consulted.  Cardiac 70 sinus normal Pulse ox 100% room air normal   Amount and/or Complexity of Data Reviewed External Data Reviewed: notes.    Details: Discharge summary and operation note from the past 2 weeks reviewed Labs: ordered. Decision-making details documented in ED Course. Radiology: ordered and independent interpretation performed. Decision-making details documented in ED Course.  Risk Prescription drug management.   9:01 PM Patient in similar condition.  Labs notable for leukocytosis, and repeat demonstration of his poor kidney function, he is on dialysis. I discussed his case with our vascular surgeon, Dr. Gwenlyn Saran several times.   With concern for limb ischemia patient will have admission to family practice where he was discharged last week, be started on heparin drip with anticipated OR versus angiography tomorrow.  Patient will be n.p.o. after midnight.        Final Clinical Impression(s) / ED Diagnoses Final diagnoses:  Acute lower limb ischemia  CRITICAL CARE Performed by: Carmin Muskrat Total critical care time: 35 minutes Critical care time was exclusive of separately billable procedures and treating other patients. Critical care was necessary to treat or prevent imminent or life-threatening deterioration. Critical care was time spent personally by me on the following activities: development of treatment plan with patient and/or surrogate as well as nursing, discussions with consultants, evaluation of  patient's response to treatment, examination of patient, obtaining history from patient or surrogate, ordering and performing treatments and interventions, ordering and review of laboratory studies, ordering and review of radiographic studies, pulse oximetry and re-evaluation of patient's condition.    Carmin Muskrat, MD 08/21/22 2102

## 2022-08-21 NOTE — Telephone Encounter (Signed)
Pt's wife, Bethena Roys, called stating that the pt was c/o numbness and pain when he is lying or sitting. She was trying to relay answers from him and she gave the phone to him directly to avoid miscommunication. The pt c/o 9/10 pain which is a significant increase since last night. His L foot is cold, pale, great toe pain, and the tip of the toe is black. He denies any swelling. He has more pain at rest than if he dangles his leg. Instructed him to go to the ED now to ensure that his stents are still patent. Confirmed understanding.

## 2022-08-21 NOTE — Assessment & Plan Note (Addendum)
VSS. Exam unchanged. Endarterectomy today with VVS.  - Admit to med-surg - CTA is pending - VVS consulted, surgery tomorrow - NPO ahead of surgery - Heparin gtt - PRN Dilaudid for pain - Neurovascular checks q4h

## 2022-08-21 NOTE — Progress Notes (Signed)
     Daily Progress Note Intern Pager: [phone]  Patient name: Albert Harris Medical record number: [id number] Date of birth: 1955/07/04 Age: 67 y.o. Gender: male  Primary Care Provider: Kerin Perna, NP Consultants: Vascular Surgery Code Status: Full  Pt Overview and Major Events to Date:  -08/21/22 Admitted, VVS consulted  Assessment and Plan: Albert Harris is a 67 y.o. male admitted for acute limb ischemia. Pertinent PMH/PSH includes left external iliac stenting 08/10/22, right fem-pop bypass, ESRD on HD, CHF, HTN, Cirrhosis, COPD.   * Acute lower limb ischemia Recent stenting of the L external iliax artery now with sudden onset of pain at rest and diminished pulses on that side. Already seen by vascular with plans for the OR tomorrow. Does report good adherence with his aspirin prior to onset. - Admit to med-surg - CTA is pending - VVS consulted, surgery tomorrow - NPO ahead of surgery - Heparin gtt - PRN Dilaudid for pain - Neurovascular checks q4h  Leukocytosis WBC elevated to 21.4. Patient does not have clinical signs and symptoms of infection. 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 Reassuringly no diarrhea since prior hospitalization. Possibly stress reaction to limb ischemia. -Monitor fever curve -Trend CBC  ESRD on dialysis [facility]) Dialysis schedule TThSa. Has not missed a session. Appears euvolemic on exam. Creatine 6.20. -Consult Nephrology AM, for inpatient dialysis while hospitalized. -Continue Phoslo  HFrEF (heart failure with reduced ejection fraction) (HCC) Appears euvolemic on exam. Respiratory status stable. Home medications: Entresto, Metoprolol. -Hold Entreso, Metoprolol before surgery -Restart as appropriate    FEN/GI: Heart healthy diet after surgery PPx: Pending vascular recommendations Dispo:Pending clinical improvment  Subjective:  ***  Objective: Temp:  [97.6 F (36.4 C)-98 F (36.7 C)] 98 F (36.7 C) (03/15 2239) Pulse Rate:   [68-72] 72 (03/15 2045) Resp:  [19-26] 26 (03/15 2045) BP: (90-145)/(58-69) 145/69 (03/15 2045) SpO2:  [100 %] 100 % (03/15 2045) Weight:  [74.8 kg] 74.8 kg (03/15 1813) Physical Exam: General: NAD *** Cardiovascular: RRR, no murmurs, no peripheral edema*** Respiratory: normal WOB on ***, CTAB, no wheezes, ronchi or rales*** Abdomen: soft, NTTP, no rebound or guarding*** Extremities: Moving all 4 extremities equally, left dorsalis pedis ***, left foot cool to touch   Laboratory: Most recent CBC Lab Results  Component Value Date   WBC 21.4 (H) 08/21/2022   HGB 15.3 08/21/2022   HCT 45.0 08/21/2022   MCV 107.6 (H) 08/21/2022   PLT 232 08/21/2022   Most recent BMP    Latest Ref Rng & Units 08/21/2022    6:42 PM  BMP  Glucose 70 - 99 mg/dL 121   BUN 8 - 23 mg/dL 27   Creatinine 0.61 - 1.24 mg/dL 6.20   Sodium 135 - 145 mmol/L 136   Potassium 3.5 - 5.1 mmol/L 3.6   Chloride 98 - 111 mmol/L 95     Other pertinent labs ***   Imaging/Diagnostic Tests: Radiologist Impression: *** My interpretation: Salvadore Oxford, MD 08/21/2022, 10:52 PM  PGY-1, [place] Intern pager: [phone], text pages welcome Secure chat group [facility] Teaching Service

## 2022-08-21 NOTE — Progress Notes (Signed)
ANTICOAGULATION CONSULT NOTE - Initial Consult  Pharmacy Consult for heparin Indication:  limb ischemia   Allergies  Allergen Reactions   Lidocaine-Prilocaine Rash    Caused red marks up and down arm    Patient Measurements: Height: 5\' 8"  (172.7 cm) Weight: 74.8 kg (165 lb) IBW/kg (Calculated) : 68.4 Heparin Dosing Weight: 74.8kg   Vital Signs: Temp: 97.6 F (36.4 C) (03/15 1813) Temp Source: Oral (03/15 1813) BP: 145/69 (03/15 2045) Pulse Rate: 72 (03/15 2045)  Labs: Recent Labs    08/21/22 1829 08/21/22 1842  HGB 13.2 15.3  HCT 42.4 45.0  PLT 232  --   CREATININE  --  6.20*    Estimated Creatinine Clearance: 11 mL/min (A) (by C-G formula based on SCr of 6.2 mg/dL (H)).   Medical History: Past Medical History:  Diagnosis Date   Acute metabolic encephalopathy [id number]   Acute on chronic diastolic CHF (congestive heart failure) ([place]) 12/15/2020   Acute respiratory disease due to COVID-19 virus 06/16/2020   Alcohol use disorder 06/30/2022   Anemia    Asthma    Bacteremia due to Gram-positive bacteria 05/09/2021   Carotid stenosis 09/12/2019   CHF (congestive heart failure) (HCC)    Claudication in peripheral vascular disease ([place]) 123456   Complication of anesthesia    " i HAVE A HARD TIME WAKING UP " (only one time)   COPD (chronic obstructive pulmonary disease) ([place])    COVID-19    positive on  06/16/20   Critical limb ischemia of right lower extremity (HCC)    ESRD on hemodialysis ([place])    t, th. sat dialysis   GERD (gastroesophageal reflux disease)    HLD (hyperlipidemia)    Hypertension    Hypertensive urgency 12/24/2016   Lower GI bleed    Nausea vomiting and diarrhea 08/13/2022   Peripheral vascular disease (HCC)    PFO with atrial septal aneurysm    by echo 11/2017   Pneumonia    Stroke [facility])    Surgical wound infection 09/20/2021   [name] (transient ischemic attack) 11/2017   Tobacco abuse    quit 07/17/19   Volume overload 12/14/2020     Assessment: Patient admitted with limb ischemia. Recently underwent stenting of external iliac artery for chronic left limb ischemia. Now presenting with no femoral pulse. Plan for OR on 3/16 for aortoiliac thrombectomy and aortogram with possibility for re-stenting. No anticoag PTA. CBC stable. Patient ESRD at baseline.   Pharmacy consulted to dose heparin.   Goal of Therapy:  Heparin level 0.3-0.7 units/ml Monitor platelets by anticoagulation protocol: Yes   Plan:  Give 3000 units bolus x 1 Start heparin infusion at 1100 units/hr Check anti-Xa level in 8 hours and daily while on heparin Continue to monitor H&H and platelets  Esmeralda Arthur, PharmD, BCCCP  08/21/2022,9:04 PM

## 2022-08-21 NOTE — Assessment & Plan Note (Addendum)
Euvolemic and denies SOB. HF meds held in the setting of hypotension, now normotensive this AM. -Restart Metoprolol -Plan to add Entresto back if BPs remain stable

## 2022-08-21 NOTE — Consult Note (Signed)
Hospital Consult    Reason for Consult:  left lower extremity rest pain Referring Physician:  Dr. Vanita Panda MRN #:  [id number]  History of Present Illness: This is a 67 y.o. male with history of end-stage renal disease on dialysis Tuesdays, Thursdays and Saturdays via right forearm AV graft.  He recently underwent stenting of left external iliac artery with Viabahn stents x 2 for rest pain of his left lower extremity with previous right lower extremity bypass.  Last night he redeveloped rest pain in the left foot and states that it was numb although the numbness is somewhat resolved he has had difficulty walking on the foot.  He does take aspirin.  Last dialysis was yesterday.  States that the pain is worse than before having stents placed.  Past Medical History:  Diagnosis Date   Acute metabolic encephalopathy [id number]   Acute on chronic diastolic CHF (congestive heart failure) ([place]) 12/15/2020   Acute respiratory disease due to COVID-19 virus 06/16/2020   Alcohol use disorder 06/30/2022   Anemia    Asthma    Bacteremia due to Gram-positive bacteria 05/09/2021   Carotid stenosis 09/12/2019   CHF (congestive heart failure) (HCC)    Claudication in peripheral vascular disease ([place]) [id number]   Complication of anesthesia    " i HAVE A HARD TIME WAKING UP " (only one time)   COPD (chronic obstructive pulmonary disease) ([place])    COVID-19    positive on  06/16/20   Critical limb ischemia of right lower extremity (HCC)    ESRD on hemodialysis ([place])    t, th. sat dialysis   GERD (gastroesophageal reflux disease)    HLD (hyperlipidemia)    Hypertension    Hypertensive urgency 12/24/2016   Lower GI bleed    Nausea vomiting and diarrhea 08/13/2022   Peripheral vascular disease (HCC)    PFO with atrial septal aneurysm    by echo 11/2017   Pneumonia    Stroke [facility])    Surgical wound infection 09/20/2021   [name] (transient ischemic attack) 11/2017   Tobacco abuse    quit 07/17/19   Volume  overload 12/14/2020    Past Surgical History:  Procedure Laterality Date   ABDOMINAL AORTOGRAM W/LOWER EXTREMITY N/A 06/21/2020   Procedure: ABDOMINAL AORTOGRAM W/LOWER EXTREMITY;  Surgeon: Angelia Mould, MD;  Location: [place] [facility];  Service: Cardiovascular;  Laterality: N/A;   ABDOMINAL AORTOGRAM W/LOWER EXTREMITY Right 08/29/2021   Procedure: ABDOMINAL AORTOGRAM W/LOWER EXTREMITY;  Surgeon: Cherre Robins, MD;  Location: [place] [facility];  Service: Cardiovascular;  Laterality: Right;   ABDOMINAL AORTOGRAM W/LOWER EXTREMITY N/A 08/10/2022   Procedure: ABDOMINAL AORTOGRAM W/LOWER EXTREMITY;  Surgeon: Angelia Mould, MD;  Location: [place] [facility];  Service: Cardiovascular;  Laterality: N/A;   APPLICATION OF WOUND VAC Right 09/21/2021   Procedure: APPLICATION OF WOUND VAC;  Surgeon: Angelia Mould, MD;  Location: [place];  Service: Vascular;  Laterality: Right;   AV FISTULA PLACEMENT Left 03/28/2020   Procedure: ARTERIOVENOUS (AV) FISTULA CREATION LEFT;  Surgeon: [name], MD;  Location: [place];  Service: Vascular;  Laterality: Left;   AV FISTULA PLACEMENT Right 09/23/2020   Procedure: RIGHT Arm ARTERIOVENOUS GRAFT CREATION.;  Surgeon: Angelia Mould, MD;  Location: [place];  Service: Vascular;  Laterality: Right;   COLONOSCOPY     COLONOSCOPY WITH PROPOFOL N/A 06/27/2020   Procedure: COLONOSCOPY WITH PROPOFOL;  Surgeon: Carol Ada, MD;  Location: [place];  Service: Endoscopy;  Laterality:  N/A;   DRAINAGE AND CLOSURE OF LYMPHOCELE Right 09/21/2021   Procedure: EVACUATION OF LYMPHOCELE RIGHT GROIN;  Surgeon: Angelia Mould, MD;  Location: [place];  Service: Vascular;  Laterality: Right;   ENDARTERECTOMY FEMORAL Right 09/05/2021   Procedure: ENDARTERECTOMY FEMORAL;  Surgeon: Angelia Mould, MD;  Location: [place];  Service: Vascular;  Laterality: Right;   FEMORAL-POPLITEAL BYPASS GRAFT Right 09/05/2021   Procedure: RIGHT  FEMORAL-POPLITEAL ARTERY BYPASS WITH Spaphenous Vein;  Surgeon: Angelia Mould, MD;  Location: [place];  Service: Vascular;  Laterality: Right;   [place]  06/27/2020   Procedure: HEMOSTASIS CLIP PLACEMENT;  Surgeon: Carol Ada, MD;  Location: [place];  Service: Endoscopy;;   INSERTION OF DIALYSIS CATHETER Right 06/24/2020   Procedure: INSERTION OF TUNNELED  DIALYSIS CATHETER; Removal of temporary dialysis catheter right neck;  Surgeon: Angelia Mould, MD;  Location: [place];  Service: Vascular;  Laterality: Right;   IR PARACENTESIS  06/30/2022   LIGATION OF ARTERIOVENOUS  FISTULA Left 07/24/2020   Procedure: LIGATION OF LEFT BRACHIOCEPHALIC ARTERIOVENOUS  FISTULA;  Surgeon: [name], MD;  Location: [place];  Service: Vascular;  Laterality: Left;   PERIPHERAL VASCULAR INTERVENTION Right 06/21/2020   Procedure: PERIPHERAL VASCULAR INTERVENTION;  Surgeon: Angelia Mould, MD;  Location: [place] [facility];  Service: Cardiovascular;  Laterality: Right;   PERIPHERAL VASCULAR INTERVENTION Left 08/10/2022   Procedure: PERIPHERAL VASCULAR INTERVENTION;  Surgeon: Angelia Mould, MD;  Location: [place] [facility];  Service: Cardiovascular;  Laterality: Left;  External Illiac   TEE WITHOUT CARDIOVERSION N/A 05/13/2021   Procedure: TRANSESOPHAGEAL ECHOCARDIOGRAM (TEE);  Surgeon: Donato Heinz, MD;  Location: [facility] ENDOSCOPY;  Service: Cardiovascular;  Laterality: N/A;   VEIN HARVEST Right 09/05/2021   Procedure: RIGHT Saphenous VEIN HARVEST;  Surgeon: Angelia Mould, MD;  Location: [place];  Service: Vascular;  Laterality: Right;    Allergies  Allergen Reactions   Lidocaine-Prilocaine Rash    Caused red marks up and down arm    Prior to Admission medications   Medication Sig Start Date End Date Taking? Authorizing Provider  albuterol (VENTOLIN HFA) 108 (90 Base) MCG/ACT inhaler INHALE 1-2 PUFFS INTO THE LUNGS EVERY 6  (SIX) HOURS AS NEEDED FOR WHEEZING OR SHORTNESS OF BREATH. Patient not taking: Reported on 08/13/2022 07/02/20 07/22/22  Delora Fuel, MD  aspirin EC 81 MG tablet Take 1 tablet (81 mg total) by mouth daily. Swallow whole. Patient taking differently: Take 81 mg by mouth daily. 08/10/22 08/10/23  Angelia Mould, MD  B Complex-C-Zn-Folic Acid (DIALYVITE/ZINC) TABS Take 1 tablet by mouth daily. 04/25/22   [provider]  calcium acetate (PHOSLO) 667 MG capsule Take 2 capsule by mouth three times a day with meals Patient taking differently: Take 1,334 mg by mouth 3 (three) times daily with meals. 06/17/21     clopidogrel (PLAVIX) 75 MG tablet Take 1 tablet (75 mg total) by mouth daily. Patient not taking: Reported on 08/13/2022 08/10/22   Angelia Mould, MD  metoprolol succinate (TOPROL-XL) 25 MG 24 hr tablet Take 1 tablet (25 mg total) by mouth daily. 07/01/22   Precious Gilding, DO  rosuvastatin (CRESTOR) 10 MG tablet Take 1 tablet (10 mg total) by mouth daily. Patient not taking: Reported on 08/13/2022 08/10/22 08/10/23  Angelia Mould, MD  sacubitril-valsartan (ENTRESTO) 24-26 MG Take 1 tablet by mouth 2 (two) times daily. 07/01/22   Precious Gilding, DO    Social  History   Socioeconomic History   Marital status: Divorced    Spouse name: Not on file   Number of children: Not on file   Years of education: Not on file   Highest education level: Not on file  Occupational History   Not on file  Tobacco Use   Smoking status: Former    Packs/day: 1.00    Years: 50.00    Additional pack years: 0.00    Total pack years: 50.00    Types: Cigarettes    Quit date: 07/17/2019    Years since quitting: 3.0   Smokeless tobacco: Never  Vaping Use   Vaping Use: Never used  Substance and Sexual Activity   Alcohol use: Not Currently    Alcohol/week: 1.0 - 3.0 standard drink of alcohol    Types: 1 - 3 Cans of beer per week   Drug use: No   Sexual activity: Not Currently  Other Topics  Concern   Not on file  Social History Narrative   Married   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   Social Determinants of Health   Financial Resource Strain: [place]  (09/12/2019)   Overall Financial Resource Strain (CARDIA)    Difficulty of Paying Living Expenses: Not hard at all  Food Insecurity: Food Insecurity Present (07/23/2022)   Hunger Vital Sign    Worried About Running Out of Food in the Last Year: Sometimes true    Ran Out of Food in the Last Year: Sometimes true  Transportation Needs: Unmet Transportation Needs (07/23/2022)   PRAPARE - Hydrologist (Medical): Yes    Lack of Transportation (Non-Medical): No  Physical Activity: Not on file  Stress: No Stress Concern Present (09/12/2019)   [place]    Feeling of Stress : Not at all  Social Connections: Not on file  Intimate Partner Violence: Not At Risk (07/23/2022)   Humiliation, Afraid, Rape, and Kick questionnaire    Fear of Current or Ex-Partner: No    Emotionally Abused: No    Physically Abused: No    Sexually Abused: No     Family History  Problem Relation Age of Onset   Hypertension Mother    Colon cancer Father    Stomach cancer Brother     Review of Systems  Constitutional: Negative.   HENT: Negative.    Eyes: Negative.   Respiratory: Negative.    Cardiovascular: Negative.   Musculoskeletal:        Leg pain and weakness  Skin: Negative.   Neurological:  Positive for sensory change and focal weakness.  Psychiatric/Behavioral: Negative.        Physical Examination  Vitals:   08/21/22 1813 08/21/22 1845  BP:    Pulse:  68  Resp:  19  Temp: 97.6 F (36.4 C)   SpO2:  100%   Body mass index is 25.09 kg/m.  Physical Exam HENT:     Head: Normocephalic.     Nose: Nose normal.  Eyes:     Pupils: Pupils are equal, round, and reactive to light.  Cardiovascular:     Pulses:          Femoral  pulses are 3+ on the right side and 0 on the left side.      Dorsalis pedis pulses are detected w/ Doppler on the right side and 0 on the left side.       Posterior tibial pulses are detected  w/ Doppler on the right side and 0 on the left side.  Pulmonary:     Effort: Pulmonary effort is normal.  Abdominal:     General: Abdomen is flat.     Palpations: Abdomen is soft.  Musculoskeletal:     Right lower leg: No edema.     Left lower leg: No edema.     Comments: Left foot is cooler than right although not mottled does not grossly appear ischemic but does have significantly delayed capillary refill  Skin:    Capillary Refill: Capillary refill takes more than 3 seconds.  Neurological:     General: No focal deficit present.     Mental Status: He is alert.  Psychiatric:        Mood and Affect: Mood normal.      CBC    Component Value Date/Time   WBC 21.4 (H) 08/21/2022 1829   RBC 3.94 (L) 08/21/2022 1829   HGB 15.3 08/21/2022 1842   HGB 10.4 (L) 06/18/2021 1431   HCT 45.0 08/21/2022 1842   HCT 31.6 (L) 06/18/2021 1431   PLT 232 08/21/2022 1829   PLT 142 (L) 06/18/2021 1431   MCV 107.6 (H) 08/21/2022 1829   MCV 96 06/18/2021 1431   MCH 33.5 08/21/2022 1829   MCHC 31.1 08/21/2022 1829   RDW 16.3 (H) 08/21/2022 1829   RDW 14.3 06/18/2021 1431   LYMPHSABS 1.5 08/21/2022 1829   LYMPHSABS 1.5 06/18/2021 1431   MONOABS 0.9 08/21/2022 1829   EOSABS 0.4 08/21/2022 1829   EOSABS 1.0 (H) 06/18/2021 1431   BASOSABS 0.1 08/21/2022 1829   BASOSABS 0.1 06/18/2021 1431    BMET    Component Value Date/Time   NA 136 08/21/2022 1842   NA 141 06/18/2021 1431   K 3.6 08/21/2022 1842   CL 95 (L) 08/21/2022 1842   CO2 25 08/15/2022 0324   GLUCOSE 121 (H) 08/21/2022 1842   BUN 27 (H) 08/21/2022 1842   BUN 20 06/18/2021 1431   CREATININE 6.20 (H) 08/21/2022 1842   CREATININE 1.15 02/19/2017 1049   CALCIUM 8.5 (L) 08/15/2022 0324   CALCIUM 8.5 (L) 06/10/2020 0900   GFRNONAA 6 (L)  08/15/2022 0324   GFRAA 6 (L) 03/04/2020 1045    COAGS: Lab Results  Component Value Date   INR 1.2 08/12/2022   INR 1.3 (H) 06/29/2022   INR 1.3 (H) 06/28/2022     Vascular Imaging:   Previous angio reviewed   ASSESSMENT/PLAN: This is a 67 y.o. male recently underwent stenting of his left external iliac artery for chronic left lower extremity limb threatening ischemia.  He was subsequently readmitted was noted to have bounding femoral pulses bilaterally.  He now has recurrent left foot pain which began last night and does not have a femoral pulse.  His foot does not appear immediately threatened.  I reviewed his most recent CT scan as well as angiography and appears patient will require left common femoral endarterectomy with aortoiliac thrombectomy and aortogram with possible restenting with a possible need for femorofemoral bypass from the right to the left to revascularize the left lower extremity.  This will be done for tomorrow in the OR.  He will need to be n.p.o. past midnight.  I have recommended heparin drip and admission to the hospitalist given his underlying chronic medical conditions.  Margherita Collyer C. Donzetta Matters, MD [facility] Office: [phone] Pager: [phone]

## 2022-08-22 ENCOUNTER — Encounter (HOSPITAL_COMMUNITY)
Admission: EM | Disposition: A | Discharge status: Home Health | Payer: Self-pay | Source: Home / Self Care | Attending: Internal Medicine

## 2022-08-22 ENCOUNTER — Encounter (HOSPITAL_COMMUNITY): Payer: Self-pay | Admitting: Family Medicine

## 2022-08-22 ENCOUNTER — Observation Stay (HOSPITAL_COMMUNITY): Payer: Medicare Other

## 2022-08-22 ENCOUNTER — Observation Stay (HOSPITAL_COMMUNITY): Payer: Medicare Other | Admitting: Certified Registered Nurse Anesthetist

## 2022-08-22 ENCOUNTER — Other Ambulatory Visit: Payer: Self-pay

## 2022-08-22 DIAGNOSIS — I998 Other disorder of circulatory system: Secondary | ICD-10-CM | POA: Diagnosis present

## 2022-08-22 DIAGNOSIS — I272 Pulmonary hypertension, unspecified: Secondary | ICD-10-CM | POA: Diagnosis present

## 2022-08-22 DIAGNOSIS — Z87891 Personal history of nicotine dependence: Secondary | ICD-10-CM

## 2022-08-22 DIAGNOSIS — Z9889 Other specified postprocedural states: Secondary | ICD-10-CM | POA: Diagnosis not present

## 2022-08-22 DIAGNOSIS — I96 Gangrene, not elsewhere classified: Secondary | ICD-10-CM | POA: Diagnosis not present

## 2022-08-22 DIAGNOSIS — I70222 Atherosclerosis of native arteries of extremities with rest pain, left leg: Principal | ICD-10-CM

## 2022-08-22 DIAGNOSIS — I509 Heart failure, unspecified: Secondary | ICD-10-CM | POA: Diagnosis not present

## 2022-08-22 DIAGNOSIS — E785 Hyperlipidemia, unspecified: Secondary | ICD-10-CM | POA: Diagnosis present

## 2022-08-22 DIAGNOSIS — K7460 Unspecified cirrhosis of liver: Secondary | ICD-10-CM | POA: Diagnosis present

## 2022-08-22 DIAGNOSIS — N186 End stage renal disease: Secondary | ICD-10-CM | POA: Diagnosis present

## 2022-08-22 DIAGNOSIS — Z7902 Long term (current) use of antithrombotics/antiplatelets: Secondary | ICD-10-CM | POA: Diagnosis not present

## 2022-08-22 DIAGNOSIS — I132 Hypertensive heart and chronic kidney disease with heart failure and with stage 5 chronic kidney disease, or end stage renal disease: Secondary | ICD-10-CM | POA: Diagnosis present

## 2022-08-22 DIAGNOSIS — Z992 Dependence on renal dialysis: Secondary | ICD-10-CM | POA: Diagnosis not present

## 2022-08-22 DIAGNOSIS — Z5982 Transportation insecurity: Secondary | ICD-10-CM | POA: Diagnosis not present

## 2022-08-22 DIAGNOSIS — Z800 Family history of malignant neoplasm of digestive organs: Secondary | ICD-10-CM | POA: Diagnosis not present

## 2022-08-22 DIAGNOSIS — I110 Hypertensive heart disease with heart failure: Secondary | ICD-10-CM | POA: Diagnosis not present

## 2022-08-22 DIAGNOSIS — T8110XA Postprocedural shock unspecified, initial encounter: Secondary | ICD-10-CM | POA: Diagnosis not present

## 2022-08-22 DIAGNOSIS — I743 Embolism and thrombosis of arteries of the lower extremities: Secondary | ICD-10-CM | POA: Diagnosis not present

## 2022-08-22 DIAGNOSIS — Z8249 Family history of ischemic heart disease and other diseases of the circulatory system: Secondary | ICD-10-CM | POA: Diagnosis not present

## 2022-08-22 DIAGNOSIS — E876 Hypokalemia: Secondary | ICD-10-CM | POA: Diagnosis present

## 2022-08-22 DIAGNOSIS — K219 Gastro-esophageal reflux disease without esophagitis: Secondary | ICD-10-CM | POA: Diagnosis present

## 2022-08-22 DIAGNOSIS — J449 Chronic obstructive pulmonary disease, unspecified: Secondary | ICD-10-CM | POA: Diagnosis not present

## 2022-08-22 DIAGNOSIS — D72829 Elevated white blood cell count, unspecified: Secondary | ICD-10-CM | POA: Diagnosis present

## 2022-08-22 DIAGNOSIS — Z7982 Long term (current) use of aspirin: Secondary | ICD-10-CM | POA: Diagnosis not present

## 2022-08-22 DIAGNOSIS — I5042 Chronic combined systolic (congestive) and diastolic (congestive) heart failure: Secondary | ICD-10-CM | POA: Diagnosis present

## 2022-08-22 DIAGNOSIS — J4489 Other specified chronic obstructive pulmonary disease: Secondary | ICD-10-CM | POA: Diagnosis present

## 2022-08-22 DIAGNOSIS — D649 Anemia, unspecified: Secondary | ICD-10-CM | POA: Diagnosis present

## 2022-08-22 DIAGNOSIS — Z79899 Other long term (current) drug therapy: Secondary | ICD-10-CM | POA: Diagnosis not present

## 2022-08-22 DIAGNOSIS — Z8616 Personal history of COVID-19: Secondary | ICD-10-CM | POA: Diagnosis not present

## 2022-08-22 DIAGNOSIS — Z8673 Personal history of transient ischemic attack (TIA), and cerebral infarction without residual deficits: Secondary | ICD-10-CM | POA: Diagnosis not present

## 2022-08-22 DIAGNOSIS — Y832 Surgical operation with anastomosis, bypass or graft as the cause of abnormal reaction of the patient, or of later complication, without mention of misadventure at the time of the procedure: Secondary | ICD-10-CM | POA: Diagnosis not present

## 2022-08-22 HISTORY — PX: INSERTION OF ILIAC STENT: SHX6256

## 2022-08-22 HISTORY — PX: AORTOGRAM: SHX6300

## 2022-08-22 HISTORY — PX: THROMBECTOMY FEMORAL ARTERY: SHX6406

## 2022-08-22 LAB — POCT I-STAT 7, (LYTES, BLD GAS, ICA,H+H)
Acid-Base Excess: 2 mmol/L (ref 0.0–2.0)
Bicarbonate: 27 mmol/L (ref 20.0–28.0)
Calcium, Ion: 1.15 mmol/L (ref 1.15–1.40)
HCT: 44 % (ref 39.0–52.0)
Hemoglobin: 15 g/dL (ref 13.0–17.0)
O2 Saturation: 100 %
Patient temperature: 37.6
Potassium: 4.4 mmol/L (ref 3.5–5.1)
Sodium: 135 mmol/L (ref 135–145)
TCO2: 28 mmol/L (ref 22–32)
pCO2 arterial: 41.8 mmHg (ref 32–48)
pH, Arterial: 7.421 (ref 7.35–7.45)
pO2, Arterial: 326 mmHg — ABNORMAL HIGH (ref 83–108)

## 2022-08-22 LAB — BASIC METABOLIC PANEL
Anion gap: 15 (ref 5–15)
BUN: 29 mg/dL — ABNORMAL HIGH (ref 8–23)
CO2: 25 mmol/L (ref 22–32)
Calcium: 9.1 mg/dL (ref 8.9–10.3)
Chloride: 94 mmol/L — ABNORMAL LOW (ref 98–111)
Creatinine, Ser: 6.76 mg/dL — ABNORMAL HIGH (ref 0.61–1.24)
GFR, Estimated: 8 mL/min — ABNORMAL LOW (ref >60)
Glucose, Bld: 94 mg/dL (ref 70–99)
Potassium: 4.5 mmol/L (ref 3.5–5.1)
Sodium: 134 mmol/L — ABNORMAL LOW (ref 135–145)

## 2022-08-22 LAB — CBC
HCT: 36.3 % — ABNORMAL LOW (ref 39.0–52.0)
HCT: 29.2 % — ABNORMAL LOW (ref 39.0–52.0)
Hemoglobin: 11.7 g/dL — ABNORMAL LOW (ref 13.0–17.0)
Hemoglobin: 9.4 g/dL — ABNORMAL LOW (ref 13.0–17.0)
MCH: 33.3 pg (ref 26.0–34.0)
MCH: 34.1 pg — ABNORMAL HIGH (ref 26.0–34.0)
MCHC: 32.2 g/dL (ref 30.0–36.0)
MCHC: 32.2 g/dL (ref 30.0–36.0)
MCV: 103.4 fL — ABNORMAL HIGH (ref 80.0–100.0)
MCV: 105.8 fL — ABNORMAL HIGH (ref 80.0–100.0)
Platelets: 206 10*3/uL (ref 150–400)
Platelets: 198 10*3/uL (ref 150–400)
RBC: 3.51 MIL/uL — ABNORMAL LOW (ref 4.22–5.81)
RBC: 2.76 MIL/uL — ABNORMAL LOW (ref 4.22–5.81)
RDW: 16.1 % — ABNORMAL HIGH (ref 11.5–15.5)
RDW: 16.1 % — ABNORMAL HIGH (ref 11.5–15.5)
WBC: 17.8 10*3/uL — ABNORMAL HIGH (ref 4.0–10.5)
WBC: 32.3 10*3/uL — ABNORMAL HIGH (ref 4.0–10.5)
nRBC: 0 % (ref 0.0–0.2)
nRBC: 0 % (ref 0.0–0.2)

## 2022-08-22 LAB — SURGICAL PCR SCREEN
MRSA, PCR: NEGATIVE
Staphylococcus aureus: NEGATIVE

## 2022-08-22 LAB — CREATININE, SERUM
Creatinine, Ser: 7.26 mg/dL — ABNORMAL HIGH (ref 0.61–1.24)
GFR, Estimated: 8 mL/min — ABNORMAL LOW (ref >60)

## 2022-08-22 LAB — HEPATITIS B SURFACE ANTIGEN: Hepatitis B Surface Ag: NONREACTIVE

## 2022-08-22 LAB — POCT ACTIVATED CLOTTING TIME: Activated Clotting Time: 266 seconds

## 2022-08-22 SURGERY — THROMBECTOMY, ARTERY, FEMORAL
Anesthesia: General | Site: Abdomen

## 2022-08-22 MED ORDER — METOPROLOL TARTRATE 5 MG/5ML IV SOLN: 2.0000 mg | INTRAVENOUS | Status: DC | PRN

## 2022-08-22 MED ORDER — SODIUM CHLORIDE 0.9 % IV SOLN: INTRAVENOUS | Status: DC

## 2022-08-22 MED ORDER — DEXAMETHASONE SODIUM PHOSPHATE 10 MG/ML IJ SOLN
INTRAMUSCULAR | Status: DC | PRN
  Administered 2022-08-22: 10 mg via INTRAVENOUS

## 2022-08-22 MED ORDER — HEPARIN SODIUM (PORCINE) 5000 UNIT/ML IJ SOLN
5000.0000 [IU] | Freq: Three times a day (TID) | INTRAMUSCULAR | Status: DC
  Administered 2022-08-23 – 2022-08-24 (×4): 5000 [IU] via SUBCUTANEOUS
  Filled 2022-08-22 (×4): qty 1

## 2022-08-22 MED ORDER — VASOPRESSIN 20 UNIT/ML IV SOLN
INTRAVENOUS | Status: DC | PRN
  Administered 2022-08-22: 2 [IU] via INTRAVENOUS
  Administered 2022-08-22 (×2): 1 [IU] via INTRAVENOUS

## 2022-08-22 MED ORDER — PANTOPRAZOLE SODIUM 40 MG PO TBEC
40.0000 mg | DELAYED_RELEASE_TABLET | Freq: Every day | ORAL | Status: DC
  Administered 2022-08-22 – 2022-08-24 (×3): 40 mg via ORAL
  Filled 2022-08-22 (×3): qty 1

## 2022-08-22 MED ORDER — ALUM & MAG HYDROXIDE-SIMETH 200-200-20 MG/5ML PO SUSP: 15.0000 mL | ORAL | Status: DC | PRN

## 2022-08-22 MED ORDER — CEFAZOLIN SODIUM-DEXTROSE 2-3 GM-%(50ML) IV SOLR
INTRAVENOUS | Status: DC | PRN
  Administered 2022-08-22: 2 g via INTRAVENOUS

## 2022-08-22 MED ORDER — BISACODYL 5 MG PO TBEC: 5.0000 mg | DELAYED_RELEASE_TABLET | Freq: Every day | ORAL | Status: DC | PRN

## 2022-08-22 MED ORDER — PHENYLEPHRINE HCL (PRESSORS) 10 MG/ML IV SOLN
INTRAVENOUS | Status: AC
  Filled 2022-08-22: qty 1

## 2022-08-22 MED ORDER — FENTANYL CITRATE (PF) 250 MCG/5ML IJ SOLN
INTRAMUSCULAR | Status: DC | PRN
  Administered 2022-08-22 (×5): 50 ug via INTRAVENOUS

## 2022-08-22 MED ORDER — ONDANSETRON HCL 4 MG/2ML IJ SOLN
INTRAMUSCULAR | Status: DC | PRN
  Administered 2022-08-22: 4 mg via INTRAVENOUS

## 2022-08-22 MED ORDER — ACETAMINOPHEN 325 MG PO TABS: 325.0000 mg | ORAL_TABLET | ORAL | Status: DC | PRN

## 2022-08-22 MED ORDER — ONDANSETRON HCL 4 MG/2ML IJ SOLN: 4.0000 mg | Freq: Once | INTRAMUSCULAR | Status: DC | PRN

## 2022-08-22 MED ORDER — ROCURONIUM BROMIDE 10 MG/ML (PF) SYRINGE
PREFILLED_SYRINGE | INTRAVENOUS | Status: DC | PRN
  Administered 2022-08-22: 20 mg via INTRAVENOUS
  Administered 2022-08-22: 60 mg via INTRAVENOUS

## 2022-08-22 MED ORDER — ONDANSETRON HCL 4 MG/2ML IJ SOLN: 4.0000 mg | Freq: Four times a day (QID) | INTRAMUSCULAR | Status: DC | PRN

## 2022-08-22 MED ORDER — ALBUMIN HUMAN 5 % IV SOLN: INTRAVENOUS | Status: DC | PRN

## 2022-08-22 MED ORDER — PHENOL 1.4 % MT LIQD: 1.0000 | OROMUCOSAL | Status: DC | PRN

## 2022-08-22 MED ORDER — LIDOCAINE 2% (20 MG/ML) 5 ML SYRINGE
INTRAMUSCULAR | Status: AC
  Filled 2022-08-22: qty 5

## 2022-08-22 MED ORDER — 0.9 % SODIUM CHLORIDE (POUR BTL) OPTIME
TOPICAL | Status: DC | PRN
  Administered 2022-08-22: 2000 mL

## 2022-08-22 MED ORDER — HEPARIN SODIUM (PORCINE) 1000 UNIT/ML IJ SOLN
INTRAMUSCULAR | Status: DC | PRN
  Administered 2022-08-22: 6000 [IU] via INTRAVENOUS

## 2022-08-22 MED ORDER — CHLORHEXIDINE GLUCONATE 0.12 % MT SOLN
OROMUCOSAL | Status: AC
  Filled 2022-08-22: qty 15

## 2022-08-22 MED ORDER — ROCURONIUM BROMIDE 10 MG/ML (PF) SYRINGE
PREFILLED_SYRINGE | INTRAVENOUS | Status: AC
  Filled 2022-08-22: qty 10

## 2022-08-22 MED ORDER — SUGAMMADEX SODIUM 200 MG/2ML IV SOLN
INTRAVENOUS | Status: DC | PRN
  Administered 2022-08-22: 200 mg via INTRAVENOUS

## 2022-08-22 MED ORDER — MAGNESIUM SULFATE 2 GM/50ML IV SOLN: 2.0000 g | Freq: Every day | INTRAVENOUS | Status: DC | PRN

## 2022-08-22 MED ORDER — CALCIUM ACETATE (PHOS BINDER) 667 MG PO CAPS
667.0000 mg | ORAL_CAPSULE | Freq: Three times a day (TID) | ORAL | Status: DC
  Administered 2022-08-22 – 2022-08-24 (×7): 667 mg via ORAL
  Filled 2022-08-22 (×7): qty 1

## 2022-08-22 MED ORDER — HYDROMORPHONE HCL 1 MG/ML IJ SOLN
1.0000 mg | INTRAMUSCULAR | Status: DC | PRN
  Administered 2022-08-22 (×4): 1 mg via INTRAVENOUS
  Filled 2022-08-22 (×4): qty 1

## 2022-08-22 MED ORDER — PROPOFOL 10 MG/ML IV BOLUS
INTRAVENOUS | Status: DC | PRN
  Administered 2022-08-22: 60 mg via INTRAVENOUS
  Administered 2022-08-22: 10 mg via INTRAVENOUS
  Administered 2022-08-22: 40 mg via INTRAVENOUS
  Administered 2022-08-22: 10 mg via INTRAVENOUS

## 2022-08-22 MED ORDER — LIDOCAINE 2% (20 MG/ML) 5 ML SYRINGE
INTRAMUSCULAR | Status: DC | PRN
  Administered 2022-08-22: 40 mg via INTRAVENOUS

## 2022-08-22 MED ORDER — VASOPRESSIN 20 UNIT/ML IV SOLN
INTRAVENOUS | Status: AC
  Filled 2022-08-22: qty 1

## 2022-08-22 MED ORDER — GUAIFENESIN-DM 100-10 MG/5ML PO SYRP: 15.0000 mL | ORAL_SOLUTION | ORAL | Status: DC | PRN

## 2022-08-22 MED ORDER — CEFAZOLIN SODIUM 1 G IJ SOLR
INTRAMUSCULAR | Status: AC
  Filled 2022-08-22: qty 20

## 2022-08-22 MED ORDER — HEPARIN SODIUM (PORCINE) 1000 UNIT/ML IJ SOLN
INTRAMUSCULAR | Status: AC
  Filled 2022-08-22: qty 10

## 2022-08-22 MED ORDER — PROPOFOL 10 MG/ML IV BOLUS
INTRAVENOUS | Status: AC
  Filled 2022-08-22: qty 20

## 2022-08-22 MED ORDER — ORAL CARE MOUTH RINSE: 15.0000 mL | Freq: Once | OROMUCOSAL | Status: AC

## 2022-08-22 MED ORDER — POTASSIUM CHLORIDE CRYS ER 20 MEQ PO TBCR: 20.0000 meq | EXTENDED_RELEASE_TABLET | Freq: Every day | ORAL | Status: DC | PRN

## 2022-08-22 MED ORDER — NOREPINEPHRINE 4 MG/250ML-% IV SOLN
0.0000 ug/min | INTRAVENOUS | Status: AC
  Administered 2022-08-22: 2 ug/min via INTRAVENOUS
  Filled 2022-08-22: qty 250

## 2022-08-22 MED ORDER — FENTANYL CITRATE (PF) 250 MCG/5ML IJ SOLN
INTRAMUSCULAR | Status: AC
  Filled 2022-08-22: qty 5

## 2022-08-22 MED ORDER — ASPIRIN 81 MG PO TBEC
81.0000 mg | DELAYED_RELEASE_TABLET | Freq: Every day | ORAL | Status: DC
  Administered 2022-08-22 – 2022-08-24 (×3): 81 mg via ORAL
  Filled 2022-08-22 (×3): qty 1

## 2022-08-22 MED ORDER — CHLORHEXIDINE GLUCONATE CLOTH 2 % EX PADS
6.0000 | MEDICATED_PAD | Freq: Every day | CUTANEOUS | Status: DC
  Administered 2022-08-22 – 2022-08-24 (×3): 6 via TOPICAL

## 2022-08-22 MED ORDER — OXYCODONE-ACETAMINOPHEN 5-325 MG PO TABS
1.0000 | ORAL_TABLET | ORAL | Status: DC | PRN
  Administered 2022-08-22 – 2022-08-24 (×5): 2 via ORAL
  Filled 2022-08-22 (×5): qty 2

## 2022-08-22 MED ORDER — SODIUM CHLORIDE 0.9 % IV SOLN: INTRAVENOUS | Status: DC | PRN

## 2022-08-22 MED ORDER — ORAL CARE MOUTH RINSE: 15.0000 mL | OROMUCOSAL | Status: DC | PRN

## 2022-08-22 MED ORDER — SENNOSIDES-DOCUSATE SODIUM 8.6-50 MG PO TABS: 1.0000 | ORAL_TABLET | Freq: Every evening | ORAL | Status: DC | PRN

## 2022-08-22 MED ORDER — ACETAMINOPHEN 650 MG RE SUPP: 325.0000 mg | RECTAL | Status: DC | PRN

## 2022-08-22 MED ORDER — LABETALOL HCL 5 MG/ML IV SOLN: 10.0000 mg | INTRAVENOUS | Status: DC | PRN

## 2022-08-22 MED ORDER — HYDRALAZINE HCL 20 MG/ML IJ SOLN: 5.0000 mg | INTRAMUSCULAR | Status: DC | PRN

## 2022-08-22 MED ORDER — PHENYLEPHRINE HCL-NACL 20-0.9 MG/250ML-% IV SOLN
INTRAVENOUS | Status: DC | PRN
  Administered 2022-08-22: 25 ug/min via INTRAVENOUS

## 2022-08-22 MED ORDER — ACETAMINOPHEN 10 MG/ML IV SOLN
INTRAVENOUS | Status: AC
  Filled 2022-08-22: qty 100

## 2022-08-22 MED ORDER — FENTANYL CITRATE (PF) 100 MCG/2ML IJ SOLN: 25.0000 ug | INTRAMUSCULAR | Status: DC | PRN

## 2022-08-22 MED ORDER — LACTATED RINGERS IV SOLN: INTRAVENOUS | Status: DC

## 2022-08-22 MED ORDER — ACETAMINOPHEN 10 MG/ML IV SOLN
INTRAVENOUS | Status: DC | PRN
  Administered 2022-08-22: 1000 mg via INTRAVENOUS

## 2022-08-22 MED ORDER — PHENYLEPHRINE 80 MCG/ML (10ML) SYRINGE FOR IV PUSH (FOR BLOOD PRESSURE SUPPORT)
PREFILLED_SYRINGE | INTRAVENOUS | Status: DC | PRN
  Administered 2022-08-22 (×2): 80 ug via INTRAVENOUS
  Administered 2022-08-22: 160 ug via INTRAVENOUS
  Administered 2022-08-22: 240 ug via INTRAVENOUS
  Administered 2022-08-22 (×2): 160 ug via INTRAVENOUS
  Administered 2022-08-22: 80 ug via INTRAVENOUS

## 2022-08-22 MED ORDER — CALCITRIOL 0.25 MCG PO CAPS
0.2500 ug | ORAL_CAPSULE | ORAL | Status: DC
  Administered 2022-08-22: 0.25 ug via ORAL
  Filled 2022-08-22: qty 1

## 2022-08-22 MED ORDER — HEMOSTATIC AGENTS (NO CHARGE) OPTIME
TOPICAL | Status: DC | PRN
  Administered 2022-08-22: 1 via TOPICAL

## 2022-08-22 MED ORDER — IODIXANOL 320 MG/ML IV SOLN
INTRAVENOUS | Status: DC | PRN
  Administered 2022-08-22: 45 mL

## 2022-08-22 MED ORDER — SODIUM CHLORIDE 0.9 % IV SOLN: 500.0000 mL | Freq: Once | INTRAVENOUS | Status: DC | PRN

## 2022-08-22 MED ORDER — CHLORHEXIDINE GLUCONATE 0.12 % MT SOLN
15.0000 mL | Freq: Once | OROMUCOSAL | Status: AC
  Administered 2022-08-22: 15 mL via OROMUCOSAL

## 2022-08-22 MED ORDER — HEPARIN 6000 UNIT IRRIGATION SOLUTION
Status: DC | PRN
  Administered 2022-08-22: 1

## 2022-08-22 MED ORDER — DOCUSATE SODIUM 100 MG PO CAPS
100.0000 mg | ORAL_CAPSULE | Freq: Every day | ORAL | Status: DC
  Administered 2022-08-23: 100 mg via ORAL
  Filled 2022-08-22 (×2): qty 1

## 2022-08-22 MED ORDER — ONDANSETRON HCL 4 MG/2ML IJ SOLN
INTRAMUSCULAR | Status: AC
  Filled 2022-08-22: qty 2

## 2022-08-22 MED ORDER — CEFAZOLIN SODIUM-DEXTROSE 2-4 GM/100ML-% IV SOLN: 2.0000 g | Freq: Three times a day (TID) | INTRAVENOUS | Status: DC

## 2022-08-22 MED ORDER — HYDROXYZINE HCL 10 MG PO TABS
10.0000 mg | ORAL_TABLET | Freq: Once | ORAL | Status: AC | PRN
  Administered 2022-08-23: 10 mg via ORAL
  Filled 2022-08-22: qty 1

## 2022-08-22 MED ORDER — DEXAMETHASONE SODIUM PHOSPHATE 10 MG/ML IJ SOLN
INTRAMUSCULAR | Status: AC
  Filled 2022-08-22: qty 1

## 2022-08-22 MED ORDER — PHENYLEPHRINE 80 MCG/ML (10ML) SYRINGE FOR IV PUSH (FOR BLOOD PRESSURE SUPPORT)
PREFILLED_SYRINGE | INTRAVENOUS | Status: AC
  Filled 2022-08-22: qty 20

## 2022-08-22 MED ORDER — HEPARIN 6000 UNIT IRRIGATION SOLUTION
Status: AC
  Filled 2022-08-22: qty 500

## 2022-08-22 SURGICAL SUPPLY — 100 items
ADH SKN CLS APL DERMABOND .7 (GAUZE/BANDAGES/DRESSINGS) ×2
ADH SKN CLS LQ APL DERMABOND (GAUZE/BANDAGES/DRESSINGS) ×2
APL PRP STRL LF DISP 70% ISPRP (MISCELLANEOUS) ×2
BAG COUNTER SPONGE SURGICOUNT (BAG) ×3 IMPLANT
BAG SPNG CNTER NS LX DISP (BAG) ×2
BANDAGE ESMARK 6X9 LF (GAUZE/BANDAGES/DRESSINGS) IMPLANT
BNDG CMPR 9X6 STRL LF SNTH (GAUZE/BANDAGES/DRESSINGS)
BNDG ELASTIC 4X5.8 VLCR STR LF (GAUZE/BANDAGES/DRESSINGS) IMPLANT
BNDG ESMARK 6X9 LF (GAUZE/BANDAGES/DRESSINGS)
CANISTER SUCT 3000ML PPV (MISCELLANEOUS) ×3 IMPLANT
CANNULA VESSEL 3MM 2 BLNT TIP (CANNULA) IMPLANT
CATH EMB 3FR 40 (CATHETERS) IMPLANT
CATH EMB 3FR 80 (CATHETERS) IMPLANT
CATH EMB 4FR 40 (CATHETERS) IMPLANT
CATH EMB 4FR 80 (CATHETERS) IMPLANT
CATH EMB 5FR 80CM (CATHETERS) IMPLANT
CATH OMNI FLUSH 5F 65CM (CATHETERS) ×3 IMPLANT
CHLORAPREP W/TINT 26 (MISCELLANEOUS) ×3 IMPLANT
CLIP LIGATING EXTRA MED SLVR (CLIP) ×3 IMPLANT
CLIP LIGATING EXTRA SM BLUE (MISCELLANEOUS) ×3 IMPLANT
CNTNR URN SCR LID CUP LEK RST (MISCELLANEOUS) IMPLANT
CONT SPEC 4OZ STRL OR WHT (MISCELLANEOUS) ×2
COVER BACK TABLE 80X110 HD (DRAPES) ×6 IMPLANT
COVER DOME SNAP 22 D (MISCELLANEOUS) IMPLANT
COVER PROBE W GEL 5X96 (DRAPES) ×3 IMPLANT
CUFF TOURN SGL QUICK 24 (TOURNIQUET CUFF)
CUFF TOURN SGL QUICK 34 (TOURNIQUET CUFF)
CUFF TOURN SGL QUICK 42 (TOURNIQUET CUFF) IMPLANT
CUFF TRNQT CYL 24X4X16.5-23 (TOURNIQUET CUFF) IMPLANT
CUFF TRNQT CYL 34X4.125X (TOURNIQUET CUFF) IMPLANT
DERMABOND ADVANCED .7 DNX12 (GAUZE/BANDAGES/DRESSINGS) ×3 IMPLANT
DERMABOND ADVANCED .7 DNX6 (GAUZE/BANDAGES/DRESSINGS) IMPLANT
DEVICE TORQUE KENDALL .025-038 (MISCELLANEOUS) IMPLANT
DRAIN CHANNEL 15F RND FF W/TCR (WOUND CARE) IMPLANT
DRAPE HALF SHEET 40X57 (DRAPES) IMPLANT
DRAPE X-RAY CASS 24X20 (DRAPES) IMPLANT
ELECT REM PT RETURN 9FT ADLT (ELECTROSURGICAL) ×2
ELECTRODE REM PT RTRN 9FT ADLT (ELECTROSURGICAL) ×3 IMPLANT
EVACUATOR SILICONE 100CC (DRAIN) IMPLANT
FILTER CO2 0.2 MICRON (VASCULAR PRODUCTS) IMPLANT
FILTER CO2 INSUFFLATOR AX1008 (MISCELLANEOUS) IMPLANT
GLOVE BIO SURGEON STRL SZ7.5 (GLOVE) ×3 IMPLANT
GLOVE BIOGEL PI IND STRL 6.5 (GLOVE) IMPLANT
GLOVE BIOGEL PI IND STRL 7.0 (GLOVE) IMPLANT
GLOVE BIOGEL PI IND STRL 7.5 (GLOVE) IMPLANT
GLOVE ECLIPSE 7.0 STRL STRAW (GLOVE) IMPLANT
GLOVE INDICATOR 6.5 STRL GRN (GLOVE) IMPLANT
GLOVE INDICATOR 7.5 STRL GRN (GLOVE) IMPLANT
GOWN STRL REUS W/ TWL LRG LVL3 (GOWN DISPOSABLE) ×6 IMPLANT
GOWN STRL REUS W/ TWL XL LVL3 (GOWN DISPOSABLE) ×3 IMPLANT
GOWN STRL REUS W/TWL LRG LVL3 (GOWN DISPOSABLE) ×10
GOWN STRL REUS W/TWL XL LVL3 (GOWN DISPOSABLE) ×2
GUIDEWIRE ANGLED .035X150CM (WIRE) IMPLANT
INSERT FOGARTY SM (MISCELLANEOUS) IMPLANT
KIT BASIN OR (CUSTOM PROCEDURE TRAY) ×3 IMPLANT
KIT ENCORE 26 ADVANTAGE (KITS) IMPLANT
KIT MICROPUNCTURE NIT STIFF (SHEATH) IMPLANT
KIT TURNOVER KIT B (KITS) ×3 IMPLANT
MARKER GRAFT CORONARY BYPASS (MISCELLANEOUS) IMPLANT
NDL PERC 18GX7CM (NEEDLE) ×3 IMPLANT
NEEDLE PERC 18GX7CM (NEEDLE) ×2 IMPLANT
NS IRRIG 1000ML POUR BTL (IV SOLUTION) ×6 IMPLANT
PACK ENDO MINOR (CUSTOM PROCEDURE TRAY) ×3 IMPLANT
PACK PERIPHERAL VASCULAR (CUSTOM PROCEDURE TRAY) ×3 IMPLANT
PAD ARMBOARD 7.5X6 YLW CONV (MISCELLANEOUS) ×6 IMPLANT
POWDER SURGICEL 3.0 GRAM (HEMOSTASIS) IMPLANT
SET COLLECT BLD 21X3/4 12 (NEEDLE) IMPLANT
SET FLUSH CO2 (MISCELLANEOUS) IMPLANT
SET MICROPUNCTURE 5F STIFF (MISCELLANEOUS) IMPLANT
SHEATH BRITE TIP 8FR 23CM (SHEATH) IMPLANT
STENT VIABAHN VBX 8X79X80 (Permanent Stent) IMPLANT
STOPCOCK 4 WAY LG BORE MALE ST (IV SETS) IMPLANT
STOPCOCK MORSE 400PSI 3WAY (MISCELLANEOUS) ×3 IMPLANT
SUT ETHILON 3 0 PS 1 (SUTURE) IMPLANT
SUT GORETEX 6.0 TT13 (SUTURE) IMPLANT
SUT GORETEX 6.0 TT9 (SUTURE) IMPLANT
SUT MNCRL AB 4-0 PS2 18 (SUTURE) ×6 IMPLANT
SUT PROLENE 5 0 C 1 24 (SUTURE) ×3 IMPLANT
SUT PROLENE 6 0 BV (SUTURE) ×3 IMPLANT
SUT PROLENE 7 0 BV 1 (SUTURE) IMPLANT
SUT SILK 2 0 SH (SUTURE) ×3 IMPLANT
SUT SILK 3 0 (SUTURE)
SUT SILK 3-0 18XBRD TIE 12 (SUTURE) IMPLANT
SUT VIC AB 2-0 CT1 27 (SUTURE) ×4
SUT VIC AB 2-0 CT1 TAPERPNT 27 (SUTURE) ×6 IMPLANT
SUT VIC AB 3-0 SH 27 (SUTURE) ×4
SUT VIC AB 3-0 SH 27X BRD (SUTURE) ×6 IMPLANT
SYR 10ML LL (SYRINGE) IMPLANT
SYR 20CC LL (SYRINGE) IMPLANT
SYR 3ML LL SCALE MARK (SYRINGE) IMPLANT
SYR MEDRAD MARK V 150ML (SYRINGE) IMPLANT
TAPE UMBILICAL 1/8X30 (MISCELLANEOUS) IMPLANT
TOWEL GREEN STERILE (TOWEL DISPOSABLE) ×6 IMPLANT
TRAY FOLEY MTR SLVR 16FR STAT (SET/KITS/TRAYS/PACK) IMPLANT
TUBING EXTENTION W/L.L. (IV SETS) IMPLANT
TUBING HIGH PRESSURE 120CM (CONNECTOR) IMPLANT
UNDERPAD 30X36 HEAVY ABSORB (UNDERPADS AND DIAPERS) ×3 IMPLANT
WATER STERILE IRR 1000ML POUR (IV SOLUTION) ×3 IMPLANT
WIRE BENTSON .035X145CM (WIRE) ×3 IMPLANT
WIRE STARTER BENTSON 035X150 (WIRE) IMPLANT

## 2022-08-22 NOTE — Interval H&P Note (Signed)
History and Physical Interval Note:  08/22/2022 6:56 AM  Albert Harris  has presented today for surgery, with the diagnosis of ACUTE LEFT LEG ISCHEMIA.  The various methods of treatment have been discussed with the patient and family. After consideration of risks, benefits and other options for treatment, the patient has consented to  Procedure(s): AORTOILIAC THROMBECTOMY, COMMON FEMORAL ENDARTERECTOMY, POSSIBLE FEM-FEM BYPASS (Left) AORTOGRAM (N/A) as a surgical intervention.  [name] history has been reviewed, patient examined, no change in status, stable for surgery.  I have reviewed the patient's chart and labs.  Questions were answered to the patient's satisfaction.     Servando Snare

## 2022-08-22 NOTE — Progress Notes (Signed)
New Admission Note:   Arrival Method: Via stretcher from ED Mental Orientation:  A &O x4 Telemetry: Box 5M09 Assessment: Completed Skin:  Dry but intact except for red bump to left inner lower leg IV:  LPFA PIV Pain: C/O severe Left lower leg pain Tubes:  None Safety Measures: Bed alarm and safety precautions discussed with patient Admission: Completed 5 MW Orientation: Patient has been orientated to the room, unit and staff.  Family:  From home with wife - No family at bedside  Reading glasses and cell phone not brought to the hospital per the patient.  He has his clothes with him.  Orders have been reviewed and implemented. Will continue to monitor the patient. Call light has been placed within reach and bed alarm has been activated.   Earleen Reaper RN Phone number: [phone]

## 2022-08-22 NOTE — Hospital Course (Addendum)
Bud Face Flow PMH of PAD, ESRD (TThS), HFrEF (35-40%), Cirrhosis, COPD, CVA is a 67 y.o. male presenting with abdominal pain N/V/D and found to have acute limb ischemia.  Acute lower limb ischemia  Recent stenting of the L external iliac artery 3/4 and had sudden onset of pain at rest and diminished pulses on 3/15. He was found to have heavy calcification of left common femoral artery with an acute thrombus intermixed throughout. He received a left common femoral, profundofemoral, superficial femoral and external iliac endarterectomy with bovine pericardial patch angioplasty and a left iliofemoral and profundofemoral thromboembolectomy. After receiving anesthesia patient became hypotensive and required peripheral vasopressor (levophed) on 3/16. Was transferred out of the ICU on ***.   PCP Recommendations

## 2022-08-22 NOTE — H&P (Addendum)
[facility] Renal Consultation Note    Indication for Consultation:  Management of ESRD/hemodialysis; anemia, hypertension/volume and secondary hyperparathyroidism PCP: Juluis Mire NP  HPI: Albert Harris is a 67 y.o. male with ESRD on hemodialysis T,Th,S at [facility]. PMH: HTN, CVA,PAD,  HFrEF, tobacco abuse, COPD, COVID, medical nonadherence. H/O missed dialysis although he did attend two treatments this past week and stayed full treatment. Recent admission for norovirus/retroperitoneal hematoma 03/06-03/02/2023.   He presented to ED 08/21/2022 with signs of acute critical limb ischemia LLE following recent angioplasty and stenting of the left external iliac artery 08/10/2022 per Dr. Scot Dock. He was admitted for critical limb ischemia, seen by Dr. Donzetta Matters for suspicion of stent thrombosis.  He went to OR today for Left common femoral, profundofemoral, superficial femoral and external iliac endarterectomy with bovine pericardial patch angioplasty, Left iliofemoral and profundofemoral thromboembolectomy per Dr. Donzetta Matters this AM.   Seen in [id number]. He is awake, alert, feels much better. L foot warm touch, he denies pain at present. He describes sensation as "It felt like my foot was just dead". He denies chest pain, SOB, N, V,D. He does not appear volume overloaded by exam. HD later today on schedule.   Past Medical History:  Diagnosis Date   Acute metabolic encephalopathy [id number]   Acute on chronic diastolic CHF (congestive heart failure) ([place]) 12/15/2020   Acute respiratory disease due to COVID-19 virus 06/16/2020   Alcohol use disorder 06/30/2022   Anemia    Asthma    Bacteremia due to Gram-positive bacteria 05/09/2021   Carotid stenosis 09/12/2019   CHF (congestive heart failure) (HCC)    Claudication in peripheral vascular disease ([place]) [id number]   Complication of anesthesia    " i HAVE A HARD TIME WAKING UP " (only one time)   COPD (chronic  obstructive pulmonary disease) ([place])    COVID-19    positive on  06/16/20   Critical limb ischemia of right lower extremity (HCC)    ESRD on hemodialysis ([place])    t, th. sat dialysis   GERD (gastroesophageal reflux disease)    HLD (hyperlipidemia)    Hypertension    Hypertensive urgency 12/24/2016   Lower GI bleed    Nausea vomiting and diarrhea 08/13/2022   Peripheral vascular disease (HCC)    PFO with atrial septal aneurysm    by echo 11/2017   Pneumonia    Stroke [facility])    Surgical wound infection 09/20/2021   [name] (transient ischemic attack) 11/2017   Tobacco abuse    quit 07/17/19   Volume overload 12/14/2020   Past Surgical History:  Procedure Laterality Date   ABDOMINAL AORTOGRAM W/LOWER EXTREMITY N/A 06/21/2020   Procedure: ABDOMINAL AORTOGRAM W/LOWER EXTREMITY;  Surgeon: Angelia Mould, MD;  Location: [place] [facility];  Service: Cardiovascular;  Laterality: N/A;   ABDOMINAL AORTOGRAM W/LOWER EXTREMITY Right 08/29/2021   Procedure: ABDOMINAL AORTOGRAM W/LOWER EXTREMITY;  Surgeon: Cherre Robins, MD;  Location: [place] [facility];  Service: Cardiovascular;  Laterality: Right;   ABDOMINAL AORTOGRAM W/LOWER EXTREMITY N/A 08/10/2022   Procedure: ABDOMINAL AORTOGRAM W/LOWER EXTREMITY;  Surgeon: Angelia Mould, MD;  Location: [place] [facility];  Service: Cardiovascular;  Laterality: N/A;   APPLICATION OF WOUND VAC Right 09/21/2021   Procedure: APPLICATION OF WOUND VAC;  Surgeon: Angelia Mould, MD;  Location: [place];  Service: Vascular;  Laterality: Right;   AV FISTULA PLACEMENT Left 03/28/2020   Procedure: ARTERIOVENOUS (AV) FISTULA CREATION LEFT;  Surgeon: Carlis Abbott,  Gwenyth Allegra, MD;  Location: [place];  Service: Vascular;  Laterality: Left;   AV FISTULA PLACEMENT Right 09/23/2020   Procedure: RIGHT Arm ARTERIOVENOUS GRAFT CREATION.;  Surgeon: Angelia Mould, MD;  Location: [place];  Service: Vascular;  Laterality: Right;   COLONOSCOPY     COLONOSCOPY WITH  PROPOFOL N/A 06/27/2020   Procedure: COLONOSCOPY WITH PROPOFOL;  Surgeon: Carol Ada, MD;  Location: [place];  Service: Endoscopy;  Laterality: N/A;   DRAINAGE AND CLOSURE OF LYMPHOCELE Right 09/21/2021   Procedure: EVACUATION OF LYMPHOCELE RIGHT GROIN;  Surgeon: Angelia Mould, MD;  Location: [place];  Service: Vascular;  Laterality: Right;   ENDARTERECTOMY FEMORAL Right 09/05/2021   Procedure: ENDARTERECTOMY FEMORAL;  Surgeon: Angelia Mould, MD;  Location: [place];  Service: Vascular;  Laterality: Right;   FEMORAL-POPLITEAL BYPASS GRAFT Right 09/05/2021   Procedure: RIGHT FEMORAL-POPLITEAL ARTERY BYPASS WITH Spaphenous Vein;  Surgeon: Angelia Mould, MD;  Location: [place];  Service: Vascular;  Laterality: Right;   [place]  06/27/2020   Procedure: HEMOSTASIS CLIP PLACEMENT;  Surgeon: Carol Ada, MD;  Location: [place];  Service: Endoscopy;;   INSERTION OF DIALYSIS CATHETER Right 06/24/2020   Procedure: INSERTION OF TUNNELED  DIALYSIS CATHETER; Removal of temporary dialysis catheter right neck;  Surgeon: Angelia Mould, MD;  Location: [place];  Service: Vascular;  Laterality: Right;   IR PARACENTESIS  06/30/2022   LIGATION OF ARTERIOVENOUS  FISTULA Left 07/24/2020   Procedure: LIGATION OF LEFT BRACHIOCEPHALIC ARTERIOVENOUS  FISTULA;  Surgeon: [name], MD;  Location: [place];  Service: Vascular;  Laterality: Left;   PERIPHERAL VASCULAR INTERVENTION Right 06/21/2020   Procedure: PERIPHERAL VASCULAR INTERVENTION;  Surgeon: Angelia Mould, MD;  Location: [place] [facility];  Service: Cardiovascular;  Laterality: Right;   PERIPHERAL VASCULAR INTERVENTION Left 08/10/2022   Procedure: PERIPHERAL VASCULAR INTERVENTION;  Surgeon: Angelia Mould, MD;  Location: [place] [facility];  Service: Cardiovascular;  Laterality: Left;  External Illiac   TEE WITHOUT CARDIOVERSION N/A 05/13/2021   Procedure: TRANSESOPHAGEAL  ECHOCARDIOGRAM (TEE);  Surgeon: Donato Heinz, MD;  Location: [facility] ENDOSCOPY;  Service: Cardiovascular;  Laterality: N/A;   VEIN HARVEST Right 09/05/2021   Procedure: RIGHT Saphenous VEIN HARVEST;  Surgeon: Angelia Mould, MD;  Location: [facility] OR;  Service: Vascular;  Laterality: Right;   Family History  Problem Relation Age of Onset   Hypertension Mother    Colon cancer Father    Stomach cancer Brother    Social History:  reports that he quit smoking about 3 years ago. His smoking use included cigarettes. He has a 50.00 pack-year smoking history. He has never used smokeless tobacco. He reports that he does not currently use alcohol after a past usage of about 1.0 - 3.0 standard drink of alcohol per week. He reports that he does not use drugs. Allergies  Allergen Reactions   Lidocaine-Prilocaine Rash    Caused red marks up and down arm   Prior to Admission medications   Medication Sig Start Date End Date Taking? Authorizing Provider  albuterol (VENTOLIN HFA) 108 (90 Base) MCG/ACT inhaler INHALE 1-2 PUFFS INTO THE LUNGS EVERY 6 (SIX) HOURS AS NEEDED FOR WHEEZING OR SHORTNESS OF BREATH. 07/02/20 08/22/23 Yes Maness, Arnette Norris, MD  calcium acetate (PHOSLO) 667 MG capsule Take 2 capsule by mouth three times a day with meals Patient taking differently: Take 1,334 mg by mouth 3 (three) times daily with meals. 06/17/21  Yes   hydrOXYzine (ATARAX) 10 MG tablet Take 10 mg by mouth 3 (three) times daily. 08/18/22  Yes [provider]  metoprolol succinate (TOPROL-XL) 25 MG 24 hr tablet Take 1 tablet (25 mg total) by mouth daily. 07/01/22  Yes Precious Gilding, DO  aspirin EC 81 MG tablet Take 1 tablet (81 mg total) by mouth daily. Swallow whole. Patient not taking: Reported on 08/22/2022 08/10/22 08/10/23  Angelia Mould, MD  B Complex-C-Zn-Folic Acid (DIALYVITE/ZINC) TABS Take 1 tablet by mouth daily. Patient not taking: Reported on 08/22/2022 04/25/22   [provider]   clopidogrel (PLAVIX) 75 MG tablet Take 1 tablet (75 mg total) by mouth daily. Patient not taking: Reported on 08/13/2022 08/10/22   Angelia Mould, MD  rosuvastatin (CRESTOR) 10 MG tablet Take 1 tablet (10 mg total) by mouth daily. Patient not taking: Reported on 08/13/2022 08/10/22 08/10/23  Angelia Mould, MD  sacubitril-valsartan (ENTRESTO) 24-26 MG Take 1 tablet by mouth 2 (two) times daily. Patient not taking: Reported on 08/22/2022 07/01/22   Precious Gilding, DO   Current Facility-Administered Medications  Medication Dose Route Frequency Provider Last Rate Last Admin   0.9 %  sodium chloride infusion  250 mL Intravenous PRN Ulyses Amor, PA-C 10 mL/hr at 08/22/22 [id number] Restarted at 08/22/22 1039   0.9 %  sodium chloride infusion  500 mL Intravenous Once PRN Laurence Slate M, PA-C       0.9 %  sodium chloride infusion   Intravenous Continuous Laurence Slate M, PA-C       acetaminophen (TYLENOL) tablet 325-650 mg  325-650 mg Oral Q4H PRN Laurence Slate M, PA-C       Or   acetaminophen (TYLENOL) suppository 325-650 mg  325-650 mg Rectal Q4H PRN Ulyses Amor, PA-C       alum & mag hydroxide-simeth (MAALOX/MYLANTA) 200-200-20 MG/5ML suspension 15-30 mL  15-30 mL Oral Q2H PRN Laurence Slate M, PA-C       aspirin EC tablet 81 mg  81 mg Oral Daily Collins, Emma M, PA-C       bisacodyl (DULCOLAX) EC tablet 5 mg  5 mg Oral Daily PRN Laurence Slate M, PA-C       calcitRIOL (ROCALTROL) capsule 0.25 mcg  0.25 mcg Oral Q T,Th,Sa-HD Collins, Emma M, PA-C       calcium acetate (PHOSLO) capsule 1,334 mg  1,334 mg Oral TID WC Laurence Slate M, PA-C       chlorhexidine (PERIDEX) 0.12 % solution            [START ON 08/23/2022] docusate sodium (COLACE) capsule 100 mg  100 mg Oral Daily Laurence Slate M, PA-C       guaiFENesin-dextromethorphan (ROBITUSSIN DM) 100-10 MG/5ML syrup 15 mL  15 mL Oral Q4H PRN Ulyses Amor, PA-C       [START ON 08/23/2022] heparin injection 5,000 Units  5,000 Units Subcutaneous  Q8H Collins, Emma M, PA-C       hydrALAZINE (APRESOLINE) injection 5 mg  5 mg Intravenous Q20 Min PRN Laurence Slate M, PA-C       HYDROmorphone (DILAUDID) injection 1 mg  1 mg Intravenous Q3H PRN Laurence Slate M, PA-C   1 mg at 08/22/22 1215   labetalol (NORMODYNE) injection 10 mg  10 mg Intravenous Q10 min PRN Laurence Slate M, PA-C       magnesium sulfate IVPB 2 g 50 mL  2 g Intravenous Daily PRN Ulyses Amor, PA-C  metoprolol tartrate (LOPRESSOR) injection 2-5 mg  2-5 mg Intravenous Q2H PRN Laurence Slate M, PA-C       multivitamin (RENA-VIT) tablet 1 tablet  1 tablet Oral QHS Collins, Emma M, PA-C       ondansetron [facility]) injection 4 mg  4 mg Intravenous Q6H PRN Ulyses Amor, PA-C       Oral care mouth rinse  15 mL Mouth Rinse PRN Laurence Slate M, PA-C       oxyCODONE-acetaminophen (PERCOCET/ROXICET) 5-325 MG per tablet 1-2 tablet  1-2 tablet Oral Q4H PRN Laurence Slate M, PA-C       pantoprazole (PROTONIX) EC tablet 40 mg  40 mg Oral Daily Collins, Emma M, PA-C       phenol (CHLORASEPTIC) mouth spray 1 spray  1 spray Mouth/Throat PRN Laurence Slate M, PA-C       potassium chloride SA (KLOR-CON M) CR tablet 20-40 mEq  20-40 mEq Oral Daily PRN Laurence Slate M, PA-C       rosuvastatin (CRESTOR) tablet 10 mg  10 mg Oral Daily Collins, Emma M, PA-C       senna-docusate (Senokot-S) tablet 1 tablet  1 tablet Oral QHS PRN Ulyses Amor, PA-C       Labs: Basic Metabolic Panel: Recent Labs  Lab 08/21/22 1842 08/22/22 0614 08/22/22 0853  NA 136 134* 135  K 3.6 4.5 4.4  CL 95* 94*  --   CO2  --  25  --   GLUCOSE 121* 94  --   BUN 27* 29*  --   CREATININE 6.20* 6.76*  --   CALCIUM  --  9.1  --    Liver Function Tests: No results for input(s): "AST", "ALT", "ALKPHOS", "BILITOT", "PROT", "ALBUMIN" in the last 168 hours. No results for input(s): "LIPASE", "AMYLASE" in the last 168 hours. No results for input(s): "AMMONIA" in the last 168 hours. CBC: Recent Labs  Lab  08/21/22 1829 08/21/22 1842 08/22/22 0614 08/22/22 0853  WBC 21.4*  --  17.8*  --   NEUTROABS 18.3*  --   --   --   HGB 13.2 15.3 11.7* 15.0  HCT 42.4 45.0 36.3* 44.0  MCV 107.6*  --  103.4*  --   PLT 232  --  206  --    Cardiac Enzymes: No results for input(s): "CKTOTAL", "CKMB", "CKMBINDEX", "TROPONINI" in the last 168 hours. CBG: No results for input(s): "GLUCAP" in the last 168 hours. Iron Studies: No results for input(s): "IRON", "TIBC", "TRANSFERRIN", "FERRITIN" in the last 72 hours. Studies/Results: HYBRID OR IMAGING ([facility] ONLY)  Result Date: 08/22/2022 There is no interpretation for this exam.  This order is for images obtained during a surgical procedure.  Please See "Surgeries" Tab for more information regarding the procedure.    ROS: As per HPI otherwise negative.   Physical Exam: Vitals:   08/22/22 1030 08/22/22 1045 08/22/22 1100 08/22/22 1115  BP: (!) 87/47 (!) 115/58 121/69 116/64  Pulse: 87 86 87 90  Resp: 18 (!) 22 (!) 21 (!) 24  Temp:  98.4 F (36.9 C)  98.3 F (36.8 C)  TempSrc:      SpO2: 100% 100% 100% 98%  Weight:      Height:         General: Chronically ill appearing male in no acute distress. Head: Normocephalic, atraumatic, sclera non-icteric, mucus membranes are moist Neck: Supple. JVD not elevated. Lungs: Clear bilaterally to auscultation without wheezes, rales, or rhonchi. Breathing is unlabored. Heart:  [id number] RRR 1/6 systolic M. No R/G. SR on monitor, rate 80s.  Abdomen: Soft, NABS Lower extremities:without edema or ischemic changes, no open wounds  Neuro: Alert and oriented X 3. Moves all extremities spontaneously. Psych:  Responds to questions appropriately with a normal affect. Dialysis Access: R AVG +T/B.  L Radial aline present.   Dialysis Orders: [place] T,Th,S 4 hrs 180NRe 350/800 64.5 kg 3.0K/2.5 Ca UFP 2 AVG - No heparin - Venofer 50 mg IV weekly  - Calcitriol 0.25 mcg PO TIW  Assessment/Plan:  L Acute Critical Limb Ischemia:  S/P thrombectomy/endartectomy per Dr. Donzetta Matters. Per primary/VVS  Leukocytosis-pt does not appear toxic. Per per primary  HFrEF-EF 35-40% with G1DD 06/2022. Was Rx'd entresto however he has not been taking. Per primary. Optimize volume with dialysis.   ESRD -  T,Th,S. HD today on schedule. No heparin. Next HD 08/25/2022.   Mild Hypokalemia: Uses 3.0 K bath. Dc'd daily oral potassium supplements. K+4.5 now. Attendance at OP HD is erratic. More concerned about possible HYPERKALEMIA.   Hypertension/volume  - BP actually on soft side. No evidence of volume overload. UF as tolerated.   Anemia  - HGB 13.2 on admission, drifting down a bit, 11.7 today. ESA not indicated, follow labs.   Metabolic bone disease - Continue VDRA, Calcium acetate binder 667 mg PO TID AC.   Nutrition - low albumin. Renal diet when able to eat. Add protein supplements, renal vitamin.  Renal Recommendations: Do not use morphine in ESRD pt. Avoid baclofen. Renal dose all antibiotics and antivirals to appropriate creatinine clearance. Avoid contrast dye unless absolutely necessary in new ESRD pts.   Verda Mehta H. Owens Shark, NP-C 08/22/2022, 12:23 PM  [employer] [phone]

## 2022-08-22 NOTE — Anesthesia Procedure Notes (Signed)
Central Venous Catheter Insertion Performed by: Santa Lighter, MD, anesthesiologist Start/End3/16/2024 7:53 AM, 08/22/2022 8:03 AM Patient location: OR. Preanesthetic checklist: patient identified, IV checked, site marked, risks and benefits discussed, surgical consent, monitors and equipment checked, pre-op evaluation, timeout performed and anesthesia consent Position: Trendelenburg Lidocaine 1% used for infiltration and patient sedated Hand hygiene performed , maximum sterile barriers used  and Seldinger technique used Catheter size: 8 Fr Total catheter length 16. Central line was placed.Double lumen Procedure performed using ultrasound guided technique. Ultrasound Notes:anatomy identified, needle tip was noted to be adjacent to the nerve/plexus identified, no ultrasound evidence of intravascular and/or intraneural injection and image(s) printed for medical record Attempts: 1 Following insertion, line sutured, dressing applied and Biopatch. Post procedure assessment: blood return through all ports, free fluid flow and no air  Patient tolerated the procedure well with no immediate complications.

## 2022-08-22 NOTE — Progress Notes (Signed)
PHARMACY NOTE:  ANTIMICROBIAL RENAL DOSAGE ADJUSTMENT  Current antimicrobial regimen includes a mismatch between antimicrobial dosage and estimated renal function.  As per policy approved by the Pharmacy & Therapeutics and Medical Executive Committees, the antimicrobial dosage will be adjusted accordingly.  Current antimicrobial dosage:  cefazolin 2g IV q8h  Indication: surgical ppx  Renal Function:  Estimated Creatinine Clearance: 10.1 mL/min (A) (by C-G formula based on SCr of 6.76 mg/dL (H)). [x]      On intermittent HD, scheduled: TTS []      On CRRT    Antimicrobial dosage has been changed to:  discontinued  Additional comments: No further dosing needed after preop dose given HD status  Thank you for allowing pharmacy to be a part of this patient's care.  Einar Grad, [facility] 08/22/2022 12:18 PM

## 2022-08-22 NOTE — Anesthesia Preprocedure Evaluation (Addendum)
Anesthesia Evaluation  Patient identified by MRN, date of birth, ID band Patient awake    Reviewed: Allergy & Precautions, NPO status , Patient's Chart, lab work & pertinent test results, reviewed documented beta blocker date and time   Airway Mallampati: II  TM Distance: >3 FB Neck ROM: Full    Dental  (+) Dental Advisory Given, Poor Dentition, Chipped, Loose, Missing,    Pulmonary asthma , COPD, former smoker   Pulmonary exam normal breath sounds clear to auscultation       Cardiovascular hypertension, Pt. on home beta blockers and Pt. on medications + Peripheral Vascular Disease (Critical limb ischemia of right lower extremity) and +CHF  Normal cardiovascular exam Rhythm:Regular Rate:Normal     Neuro/Psych TIACVA    GI/Hepatic ,GERD  ,,(+)     substance abuse  alcohol use  Endo/Other  negative endocrine ROS    Renal/GU Renal disease     Musculoskeletal negative musculoskeletal ROS (+)    Abdominal   Peds  Hematology  (+) Blood dyscrasia (Plavix)   Anesthesia Other Findings Day of surgery medications reviewed with the patient.  Reproductive/Obstetrics                             Anesthesia Physical Anesthesia Plan  ASA: 4  Anesthesia Plan: General   Post-op Pain Management: Ofirmev IV (intra-op)*   Induction: Intravenous  PONV Risk Score and Plan: 2 and Dexamethasone and Ondansetron  Airway Management Planned: Oral ETT  Additional Equipment: Arterial line, CVP and Ultrasound Guidance Line Placement  Intra-op Plan:   Post-operative Plan: Extubation in OR  Informed Consent: I have reviewed the patients History and Physical, chart, labs and discussed the procedure including the risks, benefits and alternatives for the proposed anesthesia with the patient or authorized representative who has indicated his/her understanding and acceptance.     Dental advisory given  Plan  Discussed with: CRNA  Anesthesia Plan Comments: (2nd PIV)       Anesthesia Quick Evaluation

## 2022-08-22 NOTE — Transfer of Care (Signed)
Immediate Anesthesia Transfer of Care Note  Patient: Albert Harris  Procedure(s) Performed: AORTOILIAC THROMBECTOMY, COMMON FEMORAL ENDARTERECTOMY, (Left: Abdomen) AORTOGRAM INSERTION OF ILIAC STENT (Left: Abdomen)  Patient Location: PACU  Anesthesia Type:General  Level of Consciousness: awake, oriented, patient cooperative, and responds to stimulation  Airway & Oxygen Therapy: Patient Spontanous Breathing  Post-op Assessment: Report given to RN, Post -op Vital signs reviewed and stable, and Patient moving all extremities X 4  Post vital signs: Reviewed and stable. Restarted Levophed @ 62mcg/min, IVF at 50cc/h  Last Vitals:  Vitals Value Taken Time  BP 105/62 08/22/22 1033  Temp    Pulse 88 08/22/22 1035  Resp 20 08/22/22 1035  SpO2 100 % 08/22/22 1035  Vitals shown include unvalidated device data.  Last Pain:  Vitals:   08/22/22 0735  TempSrc:   PainSc: 0-No pain      Patients Stated Pain Goal: 0 ([id number])  Complications: No notable events documented.

## 2022-08-22 NOTE — Progress Notes (Signed)
Per Dr. Donzetta Matters, keep the Heparin drip infusing.  Do not stop prior to going to the OR.  Earleen Reaper RN

## 2022-08-22 NOTE — Op Note (Signed)
Patient name: Albert Harris MRN: [id number] DOB: Dec 23, 1955 Sex: male  08/22/2022 Pre-operative Diagnosis: Acute on chronic left lower extremity ischemia Post-operative diagnosis:  Same Surgeon:  Eda Paschal. Donzetta Matters, MD Assistant: Laurence Slate, PA Procedure Performed: 1.  Left common femoral, profundofemoral, superficial femoral and external iliac endarterectomy with bovine pericardial patch angioplasty 2.  Left iliofemoral and profundofemoral thromboembolectomy with 3 and 4 Fogarty's 3.  Retrograde aortogram with stent of left common and external iliac arteries with 8 x 79 mm VBX 4.  Left lower extremity angiography  Indications: 67 year old male recently underwent stenting of his left external iliac artery for rest pain.  He was evaluated last night for recurrent rest pain which had subsequently resolved after his initial procedure there was no femoral pulse and there was concern for thrombosis of the stents we discussed proceeding with the above noted operation.  Experience assistant was necessary to facilitate exposure of the common femoral artery as well as performed extensive endarterectomy with patch angioplasty and facilitate catheter and wire exchange to perform stenting of the left common and external iliac arteries  Findings: The left common femoral artery was heavily calcified and subtotally occluded with dense calcified plaque but also there was what appeared to be acute thrombus intermixed throughout.  Central thromboembolectomy returned calcified plaque only.  I did return significant acute and subacute appearing thrombus from the medial circumflex branch that was very large as well as the profunda lateral branch.  Left lower extremity angiography demonstrated stable occlusion of the SFA with reconstitution of the above the knee popliteal artery with two-vessel runoff to the ankle via the anterior tibial and peroneal arteries.  After stenting of the common and external iliac artery  where previously there was 50% inflow stenosis at the takeoff of the common iliac artery this was reduced to 0 and the entire common and external iliac arteries are now aligned with VBX down to the Viabahn stent which terminates just above the common femoral artery endarterectomy site.  I was able to visualize the Viabahn from within the endarterectomy and performed endarterectomy all the way up to this level.  At completion there was a peroneal signal at the ankle.   Procedure:  The patient was identified in the holding area and taken to the operating room where he was placed supine on the operating table and general anesthesia was induced.  He was sterilely prepped and draped in the bilateral groins in usual fashion, antibiotics were administered and a timeout was called.  A vertical incision was created in the left groin overlying the palpable common femoral calcified artery where there was no pulse.  We dissected down to the common femoral artery and the patient was fully heparinized.  We then dissected out the profunda as well as a large medial circumflex branch, the SFA and then under the inguinal ligament we divided the crossing vein.  There is no soft area for clamping the external iliac artery.  After the patient was fully heparinized we clamped the outflow and placed a vessel loop around the external iliac artery and attempted to cinched this for hemostasis.  I then opened the common femoral artery vertically up until there was inflow.  I then passed a 4 Fogarty proximally and returned only calcified plaque and no thrombus was returned.  There was thrombus within the plaque in the common femoral artery.  I then inflated the 4 Fogarty in the external iliac artery for hemostasis and controlled 1 large sidebranch for hemostasis.  We proceeded with extensive endarterectomy including up in the left external iliac artery where I was able to extend the endarterectomy all the way to the level of the previously  placed Viabahn stent and this was not disrupted.  Endarterectomy was performed down to the common femoral and then a large medial circumflex branch and profunda branch and the proximal several centimeters of the SFA were also endarterectomized.  I then passed 3 Fogarty down the medial circumflex and lateral profunda branches and did return for subacute and acute appearing thrombus in [place] and I established strong backbleeding from both and they were clamped.  A bovine pericardial patch was prepared and sewed in place as a patch angioplasty with 5-0 Prolene suture.  Prior to completion without flushing all directions.  Upon completion we released the clamps to allow flow into the SFA and then released the profunda clamps there were very strong Doppler signals into the profundus.  I then cannulated the patch with a micropuncture needle followed by wire and a sheath and perform retrograde angiography.  Given that the takeoff of the common iliac artery was stenosed and there was an area between the VBX and Viabahn stents that was heavily diseased I elected to place 1 long VBX that was 8 x 79 mm and inflated this to nominal pressure.  I did this by first placing a Bentson wire then an 8 [place] sheath and marking the screen and placing the VBX.  Completion retrograde angiography demonstrated 0% residual stenosis and there was very strong inflow into the common femoral artery.  We then performed left lower extremity angiography which demonstrated brisk flow into both the medial circumflex and profunda branches and then occlusion of the SFA after the few centimeters that were endarterectomized and reconstitution above the knee of the popliteal artery with anterior tibial and peroneal artery runoff to the ankle.  The foot was unable to be visualized.  The sheath was then removed and the cannulation site was sutured ligated with 5-0 Prolene suture.  We did not administer protamine but rather obtain hemostasis with the wound  and thoroughly irrigated and then closed this in layers with Vicryl and Monocryl.  Dermabond was placed at the skin level.  The patient was then awakened from anesthesia having tolerated the procedure well without immediate complication.  All counts were correct at completion.  EBL: 500 cc.  Contrast: 45 cc   Aynslee Mulhall C. Donzetta Matters, MD [facility] Office: [phone] Pager: [phone]

## 2022-08-22 NOTE — Progress Notes (Signed)
eLink Physician-Brief Progress Note Patient Name: Albert Harris DOB: 03/29/1956 MRN: [id number]   Date of Service  08/22/2022  HPI/Events of Note  Complaining of itching. Currently getting HD at bedside. At home meds shows he takes hydroxyzine 10mg .     eICU Interventions  Apply moisturizer to dry skin Hydroxyzine ordered      Intervention Category Minor Interventions: Other:  [name] 08/22/2022, 10:03 PM

## 2022-08-22 NOTE — Anesthesia Postprocedure Evaluation (Signed)
Anesthesia Post Note  Patient: Albert Harris  Procedure(s) Performed: AORTOILIAC THROMBECTOMY, COMMON FEMORAL ENDARTERECTOMY, (Left: Abdomen) AORTOGRAM INSERTION OF ILIAC STENT (Left: Abdomen)     Patient location during evaluation: PACU Anesthesia Type: General Level of consciousness: awake and alert Pain management: pain level controlled Vital Signs Assessment: post-procedure vital signs reviewed and stable Respiratory status: spontaneous breathing, nonlabored ventilation, respiratory function stable and patient connected to nasal cannula oxygen Cardiovascular status: stable and unstable (Requiring levophed infusion to maintain BP) Postop Assessment: no apparent nausea or vomiting Anesthetic complications: no   No notable events documented.  Last Vitals:  Vitals:   08/22/22 1700 08/22/22 1715  BP: 110/62 (!) 114/56  Pulse: 76 79  Resp: 20 (!) 21  Temp:    SpO2: 100% 97%    Last Pain:  Vitals:   08/22/22 1553  TempSrc: Oral  PainSc:                  Santa Lighter

## 2022-08-22 NOTE — Consult Note (Addendum)
NAME:  Albert Harris, MRN:  [id number], DOB:  04-27-1956, LOS: 0 ADMISSION DATE:  08/21/2022, CONSULTATION DATE:  08/22/2022 REFERRING MD:   , CHIEF COMPLAINT:   Lower limb ischemia status post thrombectomy  History of Present Illness:   67 year old with diastolic heart failure, end-stage renal disease, peripheral artery disease, COPD presenting with acute lower limb ischemia.  He had recent stenting of left external iliac artery and has been noncompliant with Plavix.  Taken to the OR today for left femoral endarterectomy and thrombectomy. Remains on levophed in ICU  Recent admission for norovirus colitis, small RP hematoma  Pertinent  Medical History    has a past medical history of Acute metabolic encephalopathy ([id number]), Acute on chronic diastolic CHF (congestive heart failure) ([place]) (12/15/2020), Acute respiratory disease due to COVID-19 virus (06/16/2020), Alcohol use disorder (06/30/2022), Anemia, Asthma, Bacteremia due to Gram-positive bacteria (05/09/2021), Carotid stenosis (09/12/2019), CHF (congestive heart failure) ([place]), Claudication in peripheral vascular disease ([place]) (123456), Complication of anesthesia, COPD (chronic obstructive pulmonary disease) ([place]), COVID-19, Critical limb ischemia of right lower extremity ([place]), ESRD on hemodialysis ([place]), GERD (gastroesophageal reflux disease), HLD (hyperlipidemia), Hypertension, Hypertensive urgency (12/24/2016), Lower GI bleed, Nausea vomiting and diarrhea (08/13/2022), Peripheral vascular disease ([place]), PFO with atrial septal aneurysm, Pneumonia, Stroke ([place]), Surgical wound infection (09/20/2021), [name] (transient ischemic attack) (11/2017), Tobacco abuse, and Volume overload (12/14/2020).   Significant Hospital Events: Including procedures, antibiotic start and stop dates in addition to other pertinent events     Interim History / Subjective:    Objective   Blood pressure 132/71, pulse 83, temperature 98.3 F (36.8 C), resp.  rate 18, height 5\' 8"  (1.727 m), weight 74.8 kg, SpO2 98 %.        Intake/Output Summary (Last 24 hours) at 08/22/2022 1252 Last data filed at 08/22/2022 1039 Gross per 24 hour  Intake 1163.53 ml  Output 500 ml  Net 663.53 ml   Filed Weights   08/21/22 1813  Weight: 74.8 kg    Examination: Blood pressure 132/71, pulse 83, temperature 98.3 F (36.8 C), resp. rate 18, height 5\' 8"  (1.727 m), weight 74.8 kg, SpO2 98 %. Gen:      No acute distress HEENT:  EOMI, sclera anicteric Neck:     No masses; no thyromegaly Lungs:    Clear to auscultation bilaterally; normal respiratory effort CV:         Regular rate and rhythm; no murmurs Abd:      + bowel sounds; soft, non-tender; no palpable masses, no distension Ext:    No edema; adequate peripheral perfusion Skin:      Warm and dry; no rash Neuro: alert and oriented x 3 Psych: normal mood and affect   Labs/imaging reviewed Significant for sodium 134, BUN/creatinine 29/6.76, WBC 17.8, hemoglobin 11.7, platelets 206   Resolved Hospital Problem list     Assessment & Plan:  Acute lower limb ischemia Status post thrombectomy and endarterectomy Dilaudid for pain control Does not need to go back on heparin drip per vascular Wean of pressors  Leukocytosis No evidence of infection.  Monitor off antibiotics  End-stage renal disease Nephrology consulted.  Will get dialysis at bedside today  HFrEF, pulmonary hypertension Holding Entresto, metoprolol.  Restart if he remains stable  Recent Small RP bleed. Monitor CBC   Best Practice (right click and "Reselect all SmartList Selections" daily)   Diet/type: Regular consistency (see orders) DVT prophylaxis: prophylactic heparin  GI prophylaxis: PPI Lines: Central line and Arterial Line Foley:  N/A  Code Status:  full code Last date of multidisciplinary goals of care discussion []   Critical care time:    The patient is critically ill with multiple organ system failure and  requires high complexity decision making for assessment and support, frequent evaluation and titration of therapies, advanced monitoring, review of radiographic studies and interpretation of complex data.   Critical Care Time devoted to patient care services, exclusive of separately billable procedures, described in this note is 35 minutes.   Marshell Garfinkel MD [facility] See Amion for pager  If no response to pager , please call [phone] until 7pm After 7:00 pm call Elink  [phone] 08/22/2022, 1:12 PM

## 2022-08-22 NOTE — Anesthesia Procedure Notes (Signed)
Arterial Line Insertion Start/End3/16/2024 7:45 AM, 08/22/2022 7:52 AM Performed by: Santa Lighter, MD, anesthesiologist  Patient location: OR. Preanesthetic checklist: patient identified, IV checked, site marked, risks and benefits discussed, surgical consent, monitors and equipment checked, pre-op evaluation, timeout performed and anesthesia consent Lidocaine 1% used for infiltration Left, radial was placed Catheter size: 20 G Hand hygiene performed  and maximum sterile barriers used   Attempts: 2 Procedure performed using ultrasound guided technique. Ultrasound Notes:anatomy identified, needle tip was noted to be adjacent to the nerve/plexus identified, no ultrasound evidence of intravascular and/or intraneural injection and image(s) printed for medical record Following insertion, dressing applied and Biopatch. Post procedure assessment: normal and unchanged  Patient tolerated the procedure well with no immediate complications.

## 2022-08-23 DIAGNOSIS — I998 Other disorder of circulatory system: Secondary | ICD-10-CM | POA: Diagnosis not present

## 2022-08-23 LAB — CBC
HCT: 31.6 % — ABNORMAL LOW (ref 39.0–52.0)
Hemoglobin: 10.2 g/dL — ABNORMAL LOW (ref 13.0–17.0)
MCH: 33.7 pg (ref 26.0–34.0)
MCHC: 32.3 g/dL (ref 30.0–36.0)
MCV: 104.3 fL — ABNORMAL HIGH (ref 80.0–100.0)
Platelets: 227 10*3/uL (ref 150–400)
RBC: 3.03 MIL/uL — ABNORMAL LOW (ref 4.22–5.81)
RDW: 15.9 % — ABNORMAL HIGH (ref 11.5–15.5)
WBC: 33.9 10*3/uL — ABNORMAL HIGH (ref 4.0–10.5)
nRBC: 0 % (ref 0.0–0.2)

## 2022-08-23 LAB — BASIC METABOLIC PANEL
Anion gap: 12 (ref 5–15)
BUN: 19 mg/dL (ref 8–23)
CO2: 26 mmol/L (ref 22–32)
Calcium: 8.8 mg/dL — ABNORMAL LOW (ref 8.9–10.3)
Chloride: 96 mmol/L — ABNORMAL LOW (ref 98–111)
Creatinine, Ser: 3.88 mg/dL — ABNORMAL HIGH (ref 0.61–1.24)
GFR, Estimated: 16 mL/min — ABNORMAL LOW (ref >60)
Glucose, Bld: 187 mg/dL — ABNORMAL HIGH (ref 70–99)
Potassium: 4.3 mmol/L (ref 3.5–5.1)
Sodium: 134 mmol/L — ABNORMAL LOW (ref 135–145)

## 2022-08-23 LAB — LIPID PANEL
Cholesterol: 133 mg/dL (ref 0–200)
HDL: 36 mg/dL — ABNORMAL LOW (ref >40)
LDL Cholesterol: 79 mg/dL (ref 0–99)
Total CHOL/HDL Ratio: 3.7 RATIO
Triglycerides: 91 mg/dL (ref <150)
VLDL: 18 mg/dL (ref 0–40)

## 2022-08-23 MED ORDER — CLOPIDOGREL BISULFATE 75 MG PO TABS
75.0000 mg | ORAL_TABLET | Freq: Every day | ORAL | Status: DC
  Administered 2022-08-23 – 2022-08-24 (×2): 75 mg via ORAL
  Filled 2022-08-23 (×2): qty 1

## 2022-08-23 MED ORDER — HYDROXYZINE HCL 10 MG PO TABS
10.0000 mg | ORAL_TABLET | Freq: Once | ORAL | Status: AC | PRN
  Administered 2022-08-23: 10 mg via ORAL
  Filled 2022-08-23: qty 1

## 2022-08-23 NOTE — Progress Notes (Signed)
   NAME:  Albert Harris, MRN:  [id number], DOB:  10-Nov-1955, LOS: 1 ADMISSION DATE:  08/21/2022, CONSULTATION DATE:  08/22/2022 REFERRING MD:   , CHIEF COMPLAINT:   Lower limb ischemia status post thrombectomy  History of Present Illness:   67 year old with diastolic heart failure, end-stage renal disease, peripheral artery disease, COPD presenting with acute lower limb ischemia.  He had recent stenting of left external iliac artery and has been noncompliant with Plavix.  Taken to the OR today for left femoral endarterectomy and thrombectomy. Remains on levophed in ICU  Recent admission for norovirus colitis, small RP hematoma  Pertinent  Medical History    has a past medical history of Acute metabolic encephalopathy (XX123456), Acute on chronic diastolic CHF (congestive heart failure) ([place]) (12/15/2020), Acute respiratory disease due to COVID-19 virus (06/16/2020), Alcohol use disorder (06/30/2022), Anemia, Asthma, Bacteremia due to Gram-positive bacteria (05/09/2021), Carotid stenosis (09/12/2019), CHF (congestive heart failure) ([place]), Claudication in peripheral vascular disease ([place]) (123456), Complication of anesthesia, COPD (chronic obstructive pulmonary disease) ([place]), COVID-19, Critical limb ischemia of right lower extremity ([place]), ESRD on hemodialysis ([place]), GERD (gastroesophageal reflux disease), HLD (hyperlipidemia), Hypertension, Hypertensive urgency (12/24/2016), Lower GI bleed, Nausea vomiting and diarrhea (08/13/2022), Peripheral vascular disease ([place]), PFO with atrial septal aneurysm, Pneumonia, Stroke ([place]), Surgical wound infection (09/20/2021), [name] (transient ischemic attack) (11/2017), Tobacco abuse, and Volume overload (12/14/2020).   Significant Hospital Events: Including procedures, antibiotic start and stop dates in addition to other pertinent events     Interim History / Subjective:  No events, pain mostly better, wants to know when he is cleared to go home.  Objective    Blood pressure 114/65, pulse 87, temperature 97.9 F (36.6 C), temperature source Oral, resp. rate 17, height 5\' 8"  (1.727 m), weight 64.1 kg, SpO2 90 %.        Intake/Output Summary (Last 24 hours) at 08/23/2022 0756 Last data filed at 08/23/2022 0700 Gross per 24 hour  Intake 1694.81 ml  Output 3000 ml  Net -1305.19 ml    Filed Weights   08/21/22 1813 08/22/22 2045 08/23/22 0149  Weight: 74.8 kg 66.6 kg 64.1 kg    Examination: No distress Ext warm L groin soft, incision clean, no signs hematoma Moves ext to command Aox3  BMP improved after HD WBC still up   Resolved Hospital Problem list     Assessment & Plan:  Acute lower limb ischemia Status post thrombectomy and endarterectomy, seem to be doing better - Per VVS  Mild post op shock- resolved  Leukocytosis No evidence of infection.  Monitor off antibiotics  End-stage renal disease iHD per nephrology  HFrEF, pulmonary hypertension Holding Entresto, metoprolol.  Can restart depending on how Bps recover, still a bit low today  Recent Small RP bleed. Monitor CBC   Stable for transfer to floor after VVS eval, would probably watch 1 more day to assure WBC improves.  FMTS to resume care starting tomorrow 7 am.  Erskine Emery MD PCCM

## 2022-08-23 NOTE — Progress Notes (Signed)
[facility] Progress Note    Assessment/ Plan:    L Acute Critical Limb Ischemia: S/P thrombectomy/endartectomy per Dr. Donzetta Matters. Per primary/VVS  Leukocytosis-pt does not appear toxic. Per per primary  HFrEF-EF 35-40% with G1DD 06/2022. Was Rx'd entresto however he has not been taking. Per primary. Optimize volume with dialysis.   ESRD -  T,Th,S. Next HD 08/25/2022 Tues.  Mild Hypokalemia: resolved  Hypertension/volume  - euvolemic on exam UF as tolerated.   Anemia  - HGB 13.2 on admission, drifted down a bit,  now 10.7 today (stable). ESA not indicated yet, follow labs.   Metabolic bone disease - Continue VDRA, Calcium acetate binder 667 mg PO TID AC. Check phos  Nutrition - low albumin. Renal diet when able to eat. Add protein supplements, renal vitamin.   Outpatient Dialysis Orders: [place] T,Th,S 4 hrs 180NRe 350/800 64.5 kg 3.0K/2.5 Ca UFP 2 AVG - No heparin - Venofer 50 mg IV weekly  - Calcitriol 0.25 mcg PO TIW  [name], MD [facility]   Subjective:   Patient seen and examined in ICU. No acute events overnight, no complaints currently. Tolerated HD last night with net UF 2.5L.   Objective:   BP (!) 99/55   Pulse 71   Temp 97.9 F (36.6 C) (Oral)   Resp 11   Ht 5\' 8"  (1.727 m)   Wt 64.1 kg   SpO2 90%   BMI 21.49 kg/m   Intake/Output Summary (Last 24 hours) at 08/23/2022 0910 Last data filed at 08/23/2022 0700 Gross per 24 hour  Intake 1044.81 ml  Output 2600 ml  Net -1555.19 ml   Weight change: -8.244 kg  Physical Exam: Gen: NAD CVS: RRR Resp: CTA B/L Abd: soft Ext: no sig edema Neuro: awake, alert Dialysis access: RUE AVG +b/t  Imaging: HYBRID OR IMAGING ([facility] ONLY)  Result Date: 08/22/2022 There is no interpretation for this exam.  This order is for images obtained during a surgical procedure.  Please See "Surgeries" Tab for more information regarding the procedure.    Labs: BMET Recent Labs  Lab 08/21/22 1842  08/22/22 0614 08/22/22 0853 08/22/22 1424 08/23/22 0207  NA 136 134* 135  --  134*  K 3.6 4.5 4.4  --  4.3  CL 95* 94*  --   --  96*  CO2  --  25  --   --  26  GLUCOSE 121* 94  --   --  187*  BUN 27* 29*  --   --  19  CREATININE 6.20* 6.76*  --  7.26* 3.88*  CALCIUM  --  9.1  --   --  8.8*   CBC Recent Labs  Lab 08/21/22 1829 08/21/22 1842 08/22/22 0614 08/22/22 0853 08/22/22 1424 08/23/22 0207  WBC 21.4*  --  17.8*  --  32.3* 33.9*  NEUTROABS 18.3*  --   --   --   --   --   HGB 13.2   < > 11.7* 15.0 9.4* 10.2*  HCT 42.4   < > 36.3* 44.0 29.2* 31.6*  MCV 107.6*  --  103.4*  --  105.8* 104.3*  PLT 232  --  206  --  198 227   < > = values in this interval not displayed.    Medications:     aspirin EC  81 mg Oral Daily   calcitRIOL  0.25 mcg Oral Q T,Th,Sa-HD   calcium acetate  667 mg Oral TID WC   Chlorhexidine  Gluconate Cloth  6 each Topical Daily   docusate sodium  100 mg Oral Daily   heparin  5,000 Units Subcutaneous Q8H   multivitamin  1 tablet Oral QHS   pantoprazole  40 mg Oral Daily   rosuvastatin  10 mg Oral Daily      [name], MD [facility] 08/23/2022, 9:10 AM

## 2022-08-23 NOTE — Progress Notes (Signed)
  Alert and oriented.  Informed consent signed and in chart.   TX duration: 4 Patient tolerated well.  Hand-off given to patient's nurse.   Access used: graft Access issues: none  Total UF removed: 2500 ml Medication(s) given: none Post HD VS: 121/58 Post HD weight: 64.1 kg   t    08/23/22 0130  Vitals  Temp 97.8 F (36.6 C)  Temp Source Oral  BP Location Left Arm  BP Method Automatic  Patient Position (if appropriate) Lying  Pulse Rate Source Monitor  Oxygen Therapy  O2 Device Room Air  Patient Activity (if Appropriate) In bed  Pulse Oximetry Type Continuous  Post Treatment  Dialyzer Clearance Lightly streaked  Duration of HD Treatment -hour(s) 4 hour(s)  Hemodialysis Intake (mL) 0 mL  Liters Processed 96  Fluid Removed (mL) 2500 mL  Tolerated HD Treatment Yes  Post-Hemodialysis Comments HD tx completed as expected without complications, tolerated well.  AVG/AVF Arterial Site Held (minutes) 10 minutes  AVG/AVF Venous Site Held (minutes) 10 minutes  Note  Observations pt is in bed resting, alert, oriented, verbally responsive, no complaints.  Fistula / Graft Right Forearm Arteriovenous vein graft  No placement date or time found.   Placed prior to admission: Yes  Orientation: Right  Access Location: Forearm  Access Type: Arteriovenous vein graft  Site Condition No complications  Fistula / Graft Assessment Present;Thrill;Bruit  Status Deaccessed  Drainage Description None

## 2022-08-23 NOTE — Progress Notes (Signed)
  Progress Note    08/23/2022 10:00 AM 1 Day Post-Op  Subjective: Having some left foot pain but mostly resolved from preop  Vitals:   08/23/22 0800 08/23/22 0900  BP: (!) 99/55 (!) 94/58  Pulse: 71 67  Resp: 11 12  Temp:    SpO2: 90% (!) 88%    Physical Exam: Awake alert and oriented Left groin incision clean dry intact with a strongly palpable left common femoral pulse Peroneal signal at the ankle Left foot sensory and motor intact with delayed capillary refill  CBC    Component Value Date/Time   WBC 33.9 (H) 08/23/2022 0207   RBC 3.03 (L) 08/23/2022 0207   HGB 10.2 (L) 08/23/2022 0207   HGB 10.4 (L) 06/18/2021 1431   HCT 31.6 (L) 08/23/2022 0207   HCT 31.6 (L) 06/18/2021 1431   PLT 227 08/23/2022 0207   PLT 142 (L) 06/18/2021 1431   MCV 104.3 (H) 08/23/2022 0207   MCV 96 06/18/2021 1431   MCH 33.7 08/23/2022 0207   MCHC 32.3 08/23/2022 0207   RDW 15.9 (H) 08/23/2022 0207   RDW 14.3 06/18/2021 1431   LYMPHSABS 1.5 08/21/2022 1829   LYMPHSABS 1.5 06/18/2021 1431   MONOABS 0.9 08/21/2022 1829   EOSABS 0.4 08/21/2022 1829   EOSABS 1.0 (H) 06/18/2021 1431   BASOSABS 0.1 08/21/2022 1829   BASOSABS 0.1 06/18/2021 1431    BMET    Component Value Date/Time   NA 134 (L) 08/23/2022 0207   NA 141 06/18/2021 1431   K 4.3 08/23/2022 0207   CL 96 (L) 08/23/2022 0207   CO2 26 08/23/2022 0207   GLUCOSE 187 (H) 08/23/2022 0207   BUN 19 08/23/2022 0207   BUN 20 06/18/2021 1431   CREATININE 3.88 (H) 08/23/2022 0207   CREATININE 1.15 02/19/2017 1049   CALCIUM 8.8 (L) 08/23/2022 0207   CALCIUM 8.5 (L) 06/10/2020 0900   GFRNONAA 16 (L) 08/23/2022 0207   GFRAA 6 (L) 03/04/2020 1045    INR    Component Value Date/Time   INR 1.2 08/12/2022 1501     Intake/Output Summary (Last 24 hours) at 08/23/2022 1000 Last data filed at 08/23/2022 0700 Gross per 24 hour  Intake 794.81 ml  Output 2500 ml  Net -1705.19 ml     Assessment:  67 y.o. male is s/p left common  femoral extensive endarterectomy with profunda thrombectomy and stent of the left common and external iliac arteries for acute on chronic left lower extremity ischemia now with peroneal signal at the ankle and sensory and motor intact. 1 Day Post-Op  Plan: Foot is improved from a sensory and motor standpoint but not back to baseline which may be due to to length of ischemic time.    Possibly will need repeat angio in the near future.  Continue aspirin, Plavix and statin.  Okay for transfer to floor from vascular standpoint.   Josia Cueva C. Donzetta Matters, MD [facility] Office: [phone] Pager: [phone]  08/23/2022 10:00 AM

## 2022-08-23 NOTE — Evaluation (Signed)
Occupational Therapy Evaluation Patient Details Name: Albert Harris MRN: [id number] DOB: [birth year] Today's Date: 08/23/2022   History of Present Illness 67 yo male presenting 3/15 with L foot pain and decreased blood flow. S/p Left common femoral, profundofemoral, superficial femoral and external iliac endarterectomy 3/16. Pt with recent stents placed bilaterally 3/4, re-admission on 3/6, and was home for 6 days prior to returning this admission. PMH includes: ESRD on HD TTS, HfrEF, liver cirrhosis, COPD, CVA, PAD, and RLE lymphedema.   Clinical Impression   Patient admitted for the diagnosis and procedure above.  PTA he lives at home with his spouse, who does provide any needed ADL and iADL support.  Patient primary deficit is L lower extremity pain, and low BP/dizziness.  Currently the patient is needing up to [place] for lower body ADL and basic transfers. OT is indicated in the acute setting to address deficits, and assist with patient's wish of returning home without post acute rehab.  Home based OT may be recommended depending on patient's progression.       Recommendations for follow up therapy are one component of a multi-disciplinary discharge planning process, led by the attending physician.  Recommendations may be updated based on patient status, additional functional criteria and insurance authorization.   Follow Up Recommendations  Other (comment) (No OT follow up if he progresses as expexted)     Assistance Recommended at Discharge Intermittent Supervision/Assistance  Patient can return home with the following A little help with bathing/dressing/bathroom;A little help with walking and/or transfers;Direct supervision/assist for medications management;Direct supervision/assist for financial management;Assist for transportation;Help with stairs or ramp for entrance    Functional Status Assessment  Patient has had a recent decline in their functional status and demonstrates the  ability to make significant improvements in function in a reasonable and predictable amount of time.  Equipment Recommendations  None recommended by OT    Recommendations for Other Services       Precautions / Restrictions Precautions Precautions: Fall Precaution Comments: watch BP (soft) Restrictions Weight Bearing Restrictions: No      Mobility Bed Mobility Overal bed mobility: Needs Assistance Bed Mobility: Supine to Sit, Sit to Supine     Supine to sit: Min assist Sit to supine: Min assist        Transfers Overall transfer level: Needs assistance Equipment used: Rolling walker (2 wheels) Transfers: Sit to/from Stand, Bed to chair/wheelchair/BSC Sit to Stand: Min assist     Step pivot transfers: Min assist            Balance Overall balance assessment: Needs assistance Sitting-balance support: No upper extremity supported, Feet supported Sitting balance-Leahy Scale: Good     Standing balance support: Bilateral upper extremity supported, During functional activity Standing balance-Leahy Scale: Fair                             ADL either performed or assessed with clinical judgement   ADL Overall ADL's : Needs assistance/impaired Eating/Feeding: Independent;Sitting   Grooming: Set up;Sitting   Upper Body Bathing: Min guard;Sitting   Lower Body Bathing: Minimal assistance;Sit to/from stand   Upper Body Dressing : Min guard;Sitting   Lower Body Dressing: Minimal assistance;Sit to/from stand;Moderate assistance   Toilet Transfer: Minimal assistance;BSC/3in1;Stand-pivot                   Vision Patient Visual Report: No change from baseline       Perception  Praxis      Pertinent Vitals/Pain Pain Assessment Faces Pain Scale: Hurts even more Pain Location: L thigh, L calf-foot Pain Descriptors / Indicators: Discomfort, Sharp, Aching, Tender Pain Intervention(s): Monitored during session     Hand Dominance Right    Extremity/Trunk Assessment Upper Extremity Assessment Upper Extremity Assessment: Generalized weakness   Lower Extremity Assessment Lower Extremity Assessment: Defer to PT evaluation RLE Deficits / Details: grossly 4/5 to MMT, no focal deficits. reports decreased sensation in foot RLE Sensation: history of peripheral neuropathy RLE Coordination: WNL LLE Deficits / Details: limited by pain, 3-/5, minimal movement at ankle and hip, is able to move toes. LLE: Unable to fully assess due to pain LLE Sensation: decreased light touch   Cervical / Trunk Assessment Cervical / Trunk Assessment: Kyphotic   Communication Communication Communication: HOH   Cognition Arousal/Alertness: Awake/alert Behavior During Therapy: WFL for tasks assessed/performed Overall Cognitive Status: Within Functional Limits for tasks assessed                                       General Comments  BP from 117/94 (102) in supine to 90/46 (60) once situated in recliner, BP continued to drop and pt then returned to bed.    Exercises     Shoulder Instructions      Home Living Family/patient expects to be discharged to:: Private residence Living Arrangements: Spouse/significant other Available Help at Discharge: Family;Available 24 hours/day Type of Home: House Home Access: Stairs to enter [employer] of Steps: 5 Entrance Stairs-Rails: Right Home Layout: Two level;Able to live on main level with bedroom/bathroom     Bathroom Shower/Tub: Teacher, early years/pre: Standard Bathroom Accessibility: Yes How Accessible: Accessible via walker Home Equipment: Wheelchair - manual;Grab bars - tub/shower;Hand held shower head;BSC/3in1;Standard Walker          Prior Functioning/Environment Prior Level of Function : Independent/Modified Independent             Mobility Comments: ambulates without AD, denies falls. ADLs Comments: independent ADLs, limited IADLs, spouse  drives and helps pt with socks. Wife assists with dishwashing        OT Problem List: Impaired balance (sitting and/or standing);Decreased activity tolerance;Decreased strength;Decreased knowledge of use of DME or AE;Decreased safety awareness      OT Treatment/Interventions: Self-care/ADL training;Therapeutic exercise;Balance training;Patient/family education;Therapeutic activities;DME and/or AE instruction    OT Goals(Current goals can be found in the care plan section) Acute Rehab OT Goals Patient Stated Goal: Return home OT Goal Formulation: With patient Time For Goal Achievement: 09/07/22 Potential to Achieve Goals: Good ADL Goals Pt Will Perform Grooming: with modified independence;standing Pt Will Perform Lower Body Dressing: with supervision;sit to/from stand Pt Will Transfer to Toilet: with modified independence;ambulating;regular height toilet  OT Frequency: Min 2X/week    Co-evaluation PT/OT/SLP Co-Evaluation/Treatment: Yes Reason for Co-Treatment: For patient/therapist safety;To address functional/ADL transfers PT goals addressed during session: Mobility/safety with mobility;Balance;Proper use of DME;Strengthening/ROM OT goals addressed during session: ADL's and self-care      AM-PAC OT "6 Clicks" Daily Activity     Outcome Measure Help from another person eating meals?: None Help from another person taking care of personal grooming?: A Little Help from another person toileting, which includes using toliet, bedpan, or urinal?: A Little Help from another person bathing (including washing, rinsing, drying)?: A Little Help from another person to put on and taking off regular upper  body clothing?: A Little Help from another person to put on and taking off regular lower body clothing?: A Little 6 Click Score: 19   End of Session Equipment Utilized During Treatment: Gait belt;Rolling walker (2 wheels) Nurse Communication: Mobility status  Activity Tolerance: Other  (comment) (Limited by soft BP and dizziness) Patient left: in bed;with call bell/phone within reach  OT Visit Diagnosis: Unsteadiness on feet (R26.81);Muscle weakness (generalized) (M62.81);Other symptoms and signs involving cognitive function;Dizziness and giddiness (R42)                Time: 1233-1300 OT Time Calculation (min): 27 min Charges:  OT General Charges $OT Visit: 1 Visit OT Evaluation $OT Eval Moderate Complexity: 1 Mod  08/23/2022  RP, OTR/L  Acute Rehabilitation Services  Office:  [phone]   Metta Clines 08/23/2022, 3:14 PM

## 2022-08-23 NOTE — Evaluation (Signed)
Physical Therapy Evaluation Patient Details Name: Albert Harris MRN: [id number] DOB: [birth year] Today's Date: 08/23/2022  History of Present Illness  67 yo male presenting 3/15 with L foot pain and decreased blood flow. S/p Left common femoral, profundofemoral, superficial femoral and external iliac endarterectomy 3/16. Pt with recent stents placed bilaterally 3/4, re-admission on 3/6, and was home for 6 days prior to returning this admission. PMH includes: ESRD on HD TTS, HfrEF, liver cirrhosis, COPD, CVA, PAD, and RLE lymphedema.   Clinical Impression  Pt in bed upon arrival of PT, agreeable to evaluation at this time. Prior to admission the pt was ambulating without need for DME and reports no recent falls. The pt now presents with limitations in functional mobility, strength, power, dynamic stability, and activity tolerance due to above dx, and will continue to benefit from skilled PT to address these deficits. The pt required minA to manage movement of LLE to EOB, and minA to manage sit-stand transfers and pivotal steps to recliner. The pt did report dizziness, but BP taken in sitting and standing were stable. Once seated in recliner, BP was low with MAP of 60-61 and did not improve even with legs elevated. Pt then requested to transition back to bed where BP stabilized. Will continue to benefit from skilled PT to progress endurance and activity tolerance, pt hopeful for return home.      VITALS:  - sitting EOB - BP: 117/94 (102);  - standing - BP: 121/109 (115);  - sitting in recliner - BP: 90/46 (60); - sitting in recliner with legs elevated - BP: 90/49 (61); - supine in bed - 106/53 (67)   Recommendations for follow up therapy are one component of a multi-disciplinary discharge planning process, led by the attending physician.  Recommendations may be updated based on patient status, additional functional criteria and insurance authorization.  Follow Up Recommendations Home health PT       Assistance Recommended at Discharge Intermittent Supervision/Assistance  Patient can return home with the following  A little help with walking and/or transfers;A little help with bathing/dressing/bathroom;Assistance with cooking/housework;Assist for transportation;Help with stairs or ramp for entrance    Equipment Recommendations Rolling walker (2 wheels)  Recommendations for Other Services       Functional Status Assessment Patient has had a recent decline in their functional status and demonstrates the ability to make significant improvements in function in a reasonable and predictable amount of time.     Precautions / Restrictions Precautions Precautions: Fall Precaution Comments: watch BP (soft) Restrictions Weight Bearing Restrictions: No      Mobility  Bed Mobility Overal bed mobility: Needs Assistance Bed Mobility: Supine to Sit, Sit to Supine     Supine to sit: Min assist Sit to supine: Min assist   General bed mobility comments: minA to manage LE, pt using bed rails,    Transfers Overall transfer level: Needs assistance Equipment used: Rolling walker (2 wheels) Transfers: Sit to/from Stand, Bed to chair/wheelchair/BSC Sit to Stand: Min assist   Step pivot transfers: Min assist       General transfer comment: minA to manage small lateral steps to recliner. minA to manage RW and cues for positioning    Ambulation/Gait               General Gait Details: deferred due to BP soft     Balance Overall balance assessment: Needs assistance Sitting-balance support: No upper extremity supported, Feet supported Sitting balance-Leahy Scale: Good     Standing balance  support: Bilateral upper extremity supported, During functional activity Standing balance-Leahy Scale: Fair                               Pertinent Vitals/Pain Pain Assessment Pain Assessment: Faces Faces Pain Scale: Hurts even more Pain Location: L thigh, L  calf-foot Pain Descriptors / Indicators: Discomfort, Sharp, Aching Pain Intervention(s): Limited activity within patient's tolerance, Monitored during session, Repositioned    Home Living Family/patient expects to be discharged to:: Private residence Living Arrangements: Spouse/significant other Available Help at Discharge: Family;Available 24 hours/day Type of Home: House Home Access: Stairs to enter Entrance Stairs-Rails: Right Entrance Stairs-Number of Steps: 5   Home Layout: Two level;Able to live on main level with bedroom/bathroom Home Equipment: Wheelchair - manual;Grab bars - tub/shower;Hand held shower head;BSC/3in1;Standard Environmental consultant      Prior Function Prior Level of Function : Independent/Modified Independent             Mobility Comments: ambulates without AD, denies falls. ADLs Comments: independent ADLs, limited IADLs, spouse drives and helps pt with socks. Wife assists with dishwashing     Hand Dominance   Dominant Hand: Right    Extremity/Trunk Assessment   Upper Extremity Assessment Upper Extremity Assessment: Defer to OT evaluation    Lower Extremity Assessment Lower Extremity Assessment: RLE deficits/detail;LLE deficits/detail RLE Deficits / Details: grossly 4/5 to MMT, no focal deficits. reports decreased sensation in foot RLE Sensation: history of peripheral neuropathy RLE Coordination: WNL LLE Deficits / Details: limited by pain, 3-/5, minimal movement at ankle and hip, is able to move toes. LLE: Unable to fully assess due to pain LLE Sensation: decreased light touch    Cervical / Trunk Assessment Cervical / Trunk Assessment: Kyphotic  Communication   Communication: HOH  Cognition Arousal/Alertness: Awake/alert Behavior During Therapy: WFL for tasks assessed/performed Overall Cognitive Status: Within Functional Limits for tasks assessed                                 General Comments: pt following instructions well in  session, did need cues for safety. not fully assessed at this time.        General Comments General comments (skin integrity, edema, etc.): BP from 117/94 (102) in supine to 90/46 (60) once situated in recliner, BP continued to drop and pt then returned to bed.    Exercises     Assessment/Plan    PT Assessment Patient needs continued PT services  PT Problem List Decreased strength;Decreased activity tolerance;Decreased mobility;Decreased balance       PT Treatment Interventions DME instruction;Gait training;Functional mobility training;Stair training;Therapeutic activities;Therapeutic exercise;Balance training;Neuromuscular re-education;Patient/family education    PT Goals (Current goals can be found in the Care Plan section)  Acute Rehab PT Goals Patient Stated Goal: Pt wants to get better and go home. PT Goal Formulation: With patient Time For Goal Achievement: 09/06/22 Potential to Achieve Goals: Good    Frequency Min 3X/week     Co-evaluation PT/OT/SLP Co-Evaluation/Treatment: Yes Reason for Co-Treatment: For patient/therapist safety;To address functional/ADL transfers PT goals addressed during session: Mobility/safety with mobility;Balance;Proper use of DME;Strengthening/ROM         AM-PAC PT "6 Clicks" Mobility  Outcome Measure Help needed turning from your back to your side while in a flat bed without using bedrails?: A Little Help needed moving from lying on your back to sitting on the side of a  flat bed without using bedrails?: A Little Help needed moving to and from a bed to a chair (including a wheelchair)?: A Little Help needed standing up from a chair using your arms (e.g., wheelchair or bedside chair)?: A Little Help needed to walk in hospital room?: Total (<20 ft) Help needed climbing 3-5 steps with a railing? : Total 6 Click Score: 14    End of Session Equipment Utilized During Treatment: Gait belt Activity Tolerance: Patient tolerated treatment  well;Other (comment) (soft BP) Patient left: in bed;with call bell/phone within reach;with bed alarm set Nurse Communication: Mobility status PT Visit Diagnosis: Unsteadiness on feet (R26.81);Other abnormalities of gait and mobility (R26.89);Muscle weakness (generalized) (M62.81);History of falling (Z91.81);Difficulty in walking, not elsewhere classified (R26.2)    Time: [id number] PT Time Calculation (min) (ACUTE ONLY): 24 min   Charges:   PT Evaluation $PT Eval Moderate Complexity: 1 Mod          [place]bo, PT, DPT   Acute Rehabilitation Department Office [place] Communication Preferred  Sandra Cockayne 08/23/2022, 1:41 PM

## 2022-08-24 ENCOUNTER — Encounter (HOSPITAL_COMMUNITY): Payer: Self-pay | Admitting: Vascular Surgery

## 2022-08-24 ENCOUNTER — Encounter (HOSPITAL_COMMUNITY): Payer: Self-pay

## 2022-08-24 ENCOUNTER — Other Ambulatory Visit (HOSPITAL_COMMUNITY): Payer: Self-pay

## 2022-08-24 LAB — CBC
HCT: 28.9 % — ABNORMAL LOW (ref 39.0–52.0)
Hemoglobin: 9 g/dL — ABNORMAL LOW (ref 13.0–17.0)
MCH: 33.7 pg (ref 26.0–34.0)
MCHC: 31.1 g/dL (ref 30.0–36.0)
MCV: 108.2 fL — ABNORMAL HIGH (ref 80.0–100.0)
Platelets: 194 10*3/uL (ref 150–400)
RBC: 2.67 MIL/uL — ABNORMAL LOW (ref 4.22–5.81)
RDW: 16 % — ABNORMAL HIGH (ref 11.5–15.5)
WBC: 23.8 10*3/uL — ABNORMAL HIGH (ref 4.0–10.5)
nRBC: 0 % (ref 0.0–0.2)

## 2022-08-24 LAB — BASIC METABOLIC PANEL
Anion gap: 13 (ref 5–15)
BUN: 54 mg/dL — ABNORMAL HIGH (ref 8–23)
CO2: 26 mmol/L (ref 22–32)
Calcium: 8.6 mg/dL — ABNORMAL LOW (ref 8.9–10.3)
Chloride: 96 mmol/L — ABNORMAL LOW (ref 98–111)
Creatinine, Ser: 6.83 mg/dL — ABNORMAL HIGH (ref 0.61–1.24)
GFR, Estimated: 8 mL/min — ABNORMAL LOW (ref >60)
Glucose, Bld: 102 mg/dL — ABNORMAL HIGH (ref 70–99)
Potassium: 4.5 mmol/L (ref 3.5–5.1)
Sodium: 135 mmol/L (ref 135–145)

## 2022-08-24 LAB — PHOSPHORUS: Phosphorus: 6.8 mg/dL — ABNORMAL HIGH (ref 2.5–4.6)

## 2022-08-24 LAB — MAGNESIUM: Magnesium: 2.2 mg/dL (ref 1.7–2.4)

## 2022-08-24 MED ORDER — CLOPIDOGREL BISULFATE 75 MG PO TABS
75.0000 mg | ORAL_TABLET | Freq: Every day | ORAL | 0 refills | Status: DC
  Filled 2022-08-24: qty 30, 30d supply, fill #0

## 2022-08-24 MED ORDER — OXYCODONE-ACETAMINOPHEN 5-325 MG PO TABS
1.0000 | ORAL_TABLET | Freq: Four times a day (QID) | ORAL | 0 refills | Status: AC | PRN
  Filled 2022-08-24: qty 24, 3d supply, fill #0

## 2022-08-24 NOTE — TOC Initial Note (Signed)
Transition of Care [facility]) - Initial/Assessment Note    Patient Details  Name: Albert Harris MRN: [id number] Date of Birth: [birth year]  Transition [facility]) CM/SW Contact:    Bethena Roys, RN Phone Number: 08/24/2022, 11:29 AM  Clinical Narrative: Risk for readmission assessment completed. PTA patient was from home with spouse. Patient will transition home today without home health services. PT recommendations for rolling walker with wheels-patient has declined DME and wants to use the rolling walker he has at home. No further needs identified at this time.                  Expected Discharge Plan: Home/Self Care Barriers to Discharge: No Barriers Identified  Patient Goals and CMS Choice Patient states their goals for this hospitalization and ongoing recovery are:: patient will return home   Choice offered to / list presented to : NA   Expected Discharge Plan and Services In-house Referral: NA Discharge Planning Services: CM Consult Post Acute Care Choice: NA Living arrangements for the past 2 months: Single Family Home Expected Discharge Date: 08/24/22                 DME Agency: NA   HH Arranged: NA   Prior Living Arrangements/Services Living arrangements for the past 2 months: Single Family Home Lives with:: Spouse Patient language and need for interpreter reviewed:: Yes Do you feel safe going back to the place where you live?: Yes      Need for Family Participation in Patient Care: Yes (Comment) Care giver support system in place?: Yes (comment)   Criminal Activity/Legal Involvement Pertinent to Current Situation/Hospitalization: No - Comment as needed  Activities of Daily Living Home Assistive Devices/Equipment: Scales, Eyeglasses ADL Screening (condition at time of admission) Patient's cognitive ability adequate to safely complete daily activities?: Yes Is the patient deaf or have difficulty hearing?: No Does the patient have difficulty seeing,  even when wearing glasses/contacts?: No Does the patient have difficulty concentrating, remembering, or making decisions?: No Patient able to express need for assistance with ADLs?: Yes Does the patient have difficulty dressing or bathing?: No Independently performs ADLs?: Yes (appropriate for developmental age) Does the patient have difficulty walking or climbing stairs?: Yes Weakness of Legs: Both Weakness of Arms/Hands: None  Permission Sought/Granted Permission sought to share information with : Family Supports, Case Manager   Emotional Assessment Appearance:: Appears stated age Attitude/Demeanor/Rapport: Engaged Affect (typically observed): Appropriate Orientation: : Oriented to Situation, Oriented to Place, Oriented to Self, Oriented to  Time Alcohol / Substance Use: Not Applicable Psych Involvement: No (comment)  Admission diagnosis:  Acute lower limb ischemia [I99.8] Patient Active Problem List   Diagnosis Date Noted   Acute lower limb ischemia 08/21/2022   Leukocytosis 08/21/2022   Nucleic acid assay by PCR positive for norovirus 08/13/2022   Nausea vomiting and diarrhea 08/13/2022   Colitis 08/12/2022   Retroperitoneal hematoma 08/12/2022   C. difficile colitis 07/22/2022   Alcohol use disorder 06/30/2022   Clostridium difficile diarrhea 06/30/2022   Other ascites [id number]   Acute systolic congestive heart failure ([place]) 06/30/2022   ESRD on dialysis ([place]) 06/28/2022   Spinal stenosis of cervical region 06/28/2022   Diarrhea 06/28/2022   Cirrhosis of liver ([place]) 06/28/2022   Lymphedema 06/28/2022   HFrEF (heart failure with reduced ejection fraction) ([place]) 06/28/2022   PAD (peripheral artery disease) ([place]) 09/05/2021   Thrombocytopenia ([place]) 05/05/2021   Goals of care, counseling/discussion    Hypertension    COPD (  chronic obstructive pulmonary disease) (HCC)    Claudication in peripheral vascular disease ([place]) 09/12/2019   Carotid stenosis 09/12/2019   PFO  (patent foramen ovale) 08/10/2019   Noncompliance 08/10/2019   Hx of transient ischemic attack ([name]) 08/10/2019   Chronic diastolic heart failure (HCC)    Elevated troponin    Hypertensive emergency    Acute pulmonary edema ([place]) 07/18/2019   ARF (acute renal failure) ([place]) 07/18/2019   Macrocytic anemia 07/18/2019   ESRD (end stage renal disease) ([place]) 11/09/2017   PCP:  Kerin Perna, NP Pharmacy:   [facility] [place] [address] [place] [address] Phone: [phone] Fax: [phone]  [facility] [address] [place] [address] Phone: [phone] Fax: [phone]  Social Determinants of Health (SDOH) Social History: SDOH Screenings   Food Insecurity: Food Insecurity Present (08/22/2022)  Housing: Low Risk  (08/22/2022)  Transportation Needs: Unmet Transportation Needs (08/22/2022)  Utilities: Not At Risk (08/22/2022)  Alcohol Screen: Low Risk  (09/12/2019)  Depression (PHQ2-9): High Risk (05/07/2022)  Financial Resource Strain: Low Risk  (09/12/2019)  Stress: No Stress Concern Present (09/12/2019)  Tobacco Use: Medium Risk (08/22/2022)   Readmission Risk Interventions    08/24/2022   11:27 AM 08/14/2022   10:07 AM 07/24/2022   11:52 AM  Readmission Risk Prevention Plan  Transportation Screening Complete Complete Complete  Medication Review ([place]) Referral to Pharmacy Referral to Pharmacy Complete  PCP or Specialist appointment within 3-5 days of discharge  Complete Complete  HRI or [place] Complete Patient refused Complete  SW Recovery Care/Counseling Consult Complete Complete Patient refused  Palliative Care Screening Not Applicable Not Applicable Not [place] Not Applicable Not Applicable Not Applicable

## 2022-08-24 NOTE — Progress Notes (Signed)
Physical Therapy Treatment Patient Details Name: Albert Harris MRN: [id number] DOB: July 29, 1955 Today's Date: 08/24/2022   History of Present Illness 67 yo male presenting 3/15 with L foot pain and decreased blood flow. S/p Left common femoral, profundofemoral, superficial femoral and external iliac endarterectomy 3/16. Pt with recent stents placed bilaterally 3/4, re-admission on 3/6, and was home for 6 days prior to returning this admission. PMH includes: ESRD on HD TTS, HfrEF, liver cirrhosis, COPD, CVA, PAD, and RLE lymphedema.    PT Comments    Pt pleasant and able to walk on unit with and without Rw. Without Rw slight sway with gait and improved stability with RW use. Pt able to perform stairs and HEP without physical assist. Return home without physical assist or therapy after D/C appropriate. Will follow acutely.     Recommendations for follow up therapy are one component of a multi-disciplinary discharge planning process, led by the attending physician.  Recommendations may be updated based on patient status, additional functional criteria and insurance authorization.  Follow Up Recommendations  No PT follow up     Assistance Recommended at Discharge Intermittent Supervision/Assistance  Patient can return home with the following A little help with bathing/dressing/bathroom;Assistance with cooking/housework;Assist for transportation   Equipment Recommendations  Rolling walker (2 wheels)    Recommendations for Other Services       Precautions / Restrictions Precautions Precautions: Fall Restrictions Weight Bearing Restrictions: No     Mobility  Bed Mobility Overal bed mobility: Modified Independent             General bed mobility comments: bed flat without rail    Transfers Overall transfer level: Needs assistance   Transfers: Sit to/from Stand Sit to Stand: Supervision           General transfer comment: cues not to pull up on RW when standing from  bed with RW present    Ambulation/Gait Ambulation/Gait assistance: Min guard Gait Distance (Feet): 200 Feet Assistive device: None Gait Pattern/deviations: Step-through pattern, Decreased stride length, Drifts right/left   Gait velocity interpretation: >2.62 ft/sec, indicative of community ambulatory   General Gait Details: pt able to walk 125' without RW with slight sway/drift with gait. utilized RW remainder of gait with steadiness and no sway   Stairs Stairs: Yes Stairs assistance: Supervision Stair Management: One rail Right Number of Stairs: 5 General stair comments: alternating pattern without LOB   Wheelchair Mobility    Modified Rankin (Stroke Patients Only)       Balance Overall balance assessment: Mild deficits observed, not formally tested   Sitting balance-Leahy Scale: Good     Standing balance support: No upper extremity supported Standing balance-Leahy Scale: Good                              Cognition Arousal/Alertness: Awake/alert Behavior During Therapy: WFL for tasks assessed/performed Overall Cognitive Status: Within Functional Limits for tasks assessed                                          Exercises General Exercises - Lower Extremity Long Arc Quad: AROM, Both, Seated, 20 reps Hip Flexion/Marching: AROM, Both, Seated, 20 reps    General Comments        Pertinent Vitals/Pain Pain Assessment Faces Pain Scale: Hurts even more Pain Location: left thigh Pain Descriptors /  Indicators: Aching Pain Intervention(s): Limited activity within patient's tolerance, Repositioned, Monitored during session    Home Living                          Prior Function            PT Goals (current goals can now be found in the care plan section) Progress towards PT goals: Progressing toward goals    Frequency    Min 3X/week      PT Plan Discharge plan needs to be updated    Co-evaluation               AM-PAC PT "6 Clicks" Mobility   Outcome Measure  Help needed turning from your back to your side while in a flat bed without using bedrails?: None Help needed moving from lying on your back to sitting on the side of a flat bed without using bedrails?: None Help needed moving to and from a bed to a chair (including a wheelchair)?: A Little Help needed standing up from a chair using your arms (e.g., wheelchair or bedside chair)?: A Little Help needed to walk in hospital room?: A Little Help needed climbing 3-5 steps with a railing? : A Little 6 Click Score: 20    End of Session   Activity Tolerance: Patient tolerated treatment well Patient left: in chair;with call bell/phone within reach Nurse Communication: Mobility status PT Visit Diagnosis: Other abnormalities of gait and mobility (R26.89);Muscle weakness (generalized) (M62.81);Difficulty in walking, not elsewhere classified (R26.2)     Time: [id number] PT Time Calculation (min) (ACUTE ONLY): 15 min  Charges:  $Gait Training: 8-22 mins                     Bayard Males, PT Acute Rehabilitation Services Office: [place] 08/24/2022, 10:03 AM

## 2022-08-24 NOTE — Progress Notes (Signed)
D/C order noted. Contacted [facility] SW to advise clinic of pt's d/c today and that pt should resume care tomorrow.   Zurri Rudden Renal Navigator [phone] 

## 2022-08-24 NOTE — Progress Notes (Addendum)
Progress Note  VASCULAR SURGERY ASSESSMENT & PLAN:   POD 2 S/P LEFT IF END/ L CIA STENT: Good doppler signals in the left foot (PER,DP,PT). If he can ambulate this AM, he would like to go home.   ESRD: If he is discharged today, he can go for outpt dialysis tomorrow on his own.   VQI: He is on ASA, a statin, and plavix.   [name], MD 7:31 AM   08/24/2022 6:43 AM 2 Days Post-Op  Subjective:  sitting up in bed eating breakfast.  Says his left foot feels better.  Wants to know when he is going to go home.   afebrile  Vitals:   08/24/22 0331 08/24/22 0400  BP:  130/65  Pulse:  66  Resp:  15  Temp: 97.7 F (36.5 C)   SpO2:  94%   Physical Exam: General:  no distress Cardiac:  regular Lungs:  non labored Incisions:  left groin incision is clean and dry Extremities:  brisk left PT doppler signal and monophasic left peroneal doppler signal ; left calf is soft and non tender.  Left foot is warm and well perfused with motor and sensory in tact.   CBC    Component Value Date/Time   WBC 23.8 (H) 08/24/2022 0027   RBC 2.67 (L) 08/24/2022 0027   HGB 9.0 (L) 08/24/2022 0027   HGB 10.4 (L) 06/18/2021 1431   HCT 28.9 (L) 08/24/2022 0027   HCT 31.6 (L) 06/18/2021 1431   PLT 194 08/24/2022 0027   PLT 142 (L) 06/18/2021 1431   MCV 108.2 (H) 08/24/2022 0027   MCV 96 06/18/2021 1431   MCH 33.7 08/24/2022 0027   MCHC 31.1 08/24/2022 0027   RDW 16.0 (H) 08/24/2022 0027   RDW 14.3 06/18/2021 1431   LYMPHSABS 1.5 08/21/2022 1829   LYMPHSABS 1.5 06/18/2021 1431   MONOABS 0.9 08/21/2022 1829   EOSABS 0.4 08/21/2022 1829   EOSABS 1.0 (H) 06/18/2021 1431   BASOSABS 0.1 08/21/2022 1829   BASOSABS 0.1 06/18/2021 1431    BMET    Component Value Date/Time   NA 135 08/24/2022 0027   NA 141 06/18/2021 1431   K 4.5 08/24/2022 0027   CL 96 (L) 08/24/2022 0027   CO2 26 08/24/2022 0027   GLUCOSE 102 (H) 08/24/2022 0027   BUN 54 (H) 08/24/2022 0027   BUN 20 06/18/2021 1431    CREATININE 6.83 (H) 08/24/2022 0027   CREATININE 1.15 02/19/2017 1049   CALCIUM 8.6 (L) 08/24/2022 0027   CALCIUM 8.5 (L) 06/10/2020 0900   GFRNONAA 8 (L) 08/24/2022 0027   GFRAA 6 (L) 03/04/2020 1045   INR    Component Value Date/Time   INR 1.2 08/12/2022 1501    Intake/Output Summary (Last 24 hours) at 08/24/2022 [id number] Last data filed at 08/23/2022 2000 Gross per 24 hour  Intake 480 ml  Output --  Net 480 ml   Assessment/Plan:  67 y.o. male is s/p:  left common femoral extensive endarterectomy with profunda thrombectomy and stent of the left common and external iliac arteries for acute on chronic left lower extremity ischemia   2 Days Post-Op  -pt doing well this morning with brisk left PT doppler signal and motor and sensory in tact.  Left calf is soft and non tender.  -PT worked with pt yesterday and recommending HHPT -leukocytosis improving to 23.8k from 33.9k -continue asa/statin/plavix -pt will follow up in our office in 2-3 weeks for incision check.  Our office will arrange  appt.  Per Dr. Claretha Cooper note, may need repeat angio in the future  Leontine Locket, PA-C [facility] [phone] 08/24/2022 6:43 AM

## 2022-08-24 NOTE — Progress Notes (Signed)
     Daily Progress Note Intern Pager: [phone]  Patient name: Albert Harris Medical record number: [id number] Date of birth: [birth year] Age: 67 y.o. Gender: male  Primary Care Provider: Kerin Perna, NP Consultants: CCM Code Status: Full code  Pt Overview and Major Events to Date:  3/15: Admitted to [facility]MTS 3/16: Endarterectomy with VVS; transferd to CCM for hypotension requiring Levophed 3/17: Off Levophed 3/18: Transferred back to [facility]MTS  Assessment and Plan: Albert Harris is a 67 y.o. male admitted for acute limb ischemia. Pertinent PMH/PSH includes left external iliac stenting 08/10/22, right fem-pop bypass, ESRD on HD, CHF, HTN, Cirrhosis, COPD.   * Acute lower limb ischemia S/p left common femoral, profundofemoral, superficial femoral and external iliac endarterectomy with bovine pericardial patch angioplasty. VVS following, stable repair but likely need repeat CTA in the future. Need to ambulate with PT prior to discharge -VVS consulted, appreciate recs -ASA, Plavix per VVS -Continue home Crestor 10mg  daily  Leukocytosis Improving leukocytosis, afebrile with stable vitals.  -Trend CBC  ESRD on dialysis (HCC) HD TThSa. -Nephro following, appreciate recs -Continue Phoslo  HFrEF (heart failure with reduced ejection fraction) (HCC) Euvolemic and denies SOB. HF meds held in the setting of hypotension, now normotensive this AM. -Restart Metoprolol -Plan to add Entresto back if BPs remain stable   FEN/GI: Renal diet PPx: Heparin Dispo: Home pending clinical improvement  Subjective:  Patient assessed at bedside, feels well this AM. Holding down his food well. Has not been up yet since his surgery but wants to try to ambulate today.  Objective: Temp:  [97.7 F (36.5 C)-98.5 F (36.9 C)] 97.7 F (36.5 C) (03/18 0331) Pulse Rate:  [66-84] 69 (03/18 0800) Resp:  [15-20] 15 (03/18 0800) BP: (93-130)/(45-66) 112/59 (03/18 0800) SpO2:  [80 %-94 %] 88 % (03/18  0800) Physical Exam: General: Elderly male sitting up in bed, alert, NAD Cardiovascular: RRR without murmur Respiratory: CTAB. Normal WOB on RA Abdomen: Soft, mild fullness, non-tender Extremities: No peripheral edema  Laboratory: Most recent CBC Lab Results  Component Value Date   WBC 23.8 (H) 08/24/2022   HGB 9.0 (L) 08/24/2022   HCT 28.9 (L) 08/24/2022   MCV 108.2 (H) 08/24/2022   PLT 194 08/24/2022   Most recent BMP    Latest Ref Rng & Units 08/24/2022   12:27 AM  BMP  Glucose 70 - 99 mg/dL 102   BUN 8 - 23 mg/dL 54   Creatinine 0.61 - 1.24 mg/dL 6.83   Sodium 135 - 145 mmol/L 135   Potassium 3.5 - 5.1 mmol/L 4.5   Chloride 98 - 111 mmol/L 96   CO2 22 - 32 mmol/L 26   Calcium 8.9 - 10.3 mg/dL 8.6     Other pertinent labs: Phos: 6.8 Mag: 2.2   Colletta Maryland, MD 08/24/2022, 10:03 AM  PGY-1, [place] Intern pager: [phone], text pages welcome Secure chat group [place]

## 2022-08-24 NOTE — Discharge Summary (Signed)
[facility] Discharge Summary  Patient name: Albert Harris Medical record number: [id number] Date of birth: February 12, 1956 Age: 67 y.o. Gender: male Date of Admission: 08/21/2022  Date of Discharge: 08/24/2022 Admitting Physician: Salvadore Oxford, MD  Primary Care Provider: Kerin Perna, NP Consultants: VVS  Indication for Hospitalization: Critical limb ischemia  Discharge Diagnoses/Problem List:  Principal Problem for Admission: Critical limb ischemia Other Problems addressed during stay:  Principal Problem:   Acute lower limb ischemia Active Problems:   Leukocytosis   ESRD on dialysis ([place])   HFrEF (heart failure with reduced ejection fraction) [facility])  Brief Hospital Course:  [name] PMH of PAD, ESRD (TThS), HFrEF (35-40%), Cirrhosis, COPD, CVA is a 67 y.o. male presenting with abdominal pain N/V/D and found to have acute limb ischemia.  His hospital course is outlined below:  Acute lower limb ischemia  Recent stenting of the L external iliac artery 3/4 and had sudden onset of pain at rest and diminished pulses on 3/15. He was found to have heavy calcification of left common femoral artery with an acute thrombus intermixed throughout. He received a left common femoral, profundofemoral, superficial femoral and external iliac endarterectomy with bovine pericardial patch angioplasty and a left iliofemoral and profundofemoral thromboembolectomy. After receiving anesthesia patient became hypotensive and required peripheral vasopressor (levophed) on 3/16. Was transferred out of the ICU on 3/18.  BP medicines Entresto and metoprolol were held due to initial hypotension. Metoprolol was added back prior to d/c. Work with PT and no follow up indicated.  PCP Recommendations VVS f/u in 2-3 weeks for incision check Follow-up leukocytosis, check CBC outpatient Consider restarting entresto in the outpatient setting, held inpatient secondary to low  BP  Disposition: Home  Discharge Condition: Stable   Discharge Exam:  Vitals:   08/24/22 0700 08/24/22 0800  BP:  (!) 112/59  Pulse:  69  Resp:  15  Temp:    SpO2: 92% (!) 88%   General: Elderly male sitting up in bed, alert, NAD Cardiovascular: RRR without murmur Respiratory: CTAB. Normal WOB on RA Abdomen: Soft, mild fullness, non-tender Extremities: No peripheral edema  Significant Procedures: Left common femoral, profundofemoral, superficial femoral and external iliac endarterectomy with bovine pericardial patch angioplasty   Significant Labs and Imaging:  Recent Labs  Lab 08/22/22 1424 08/23/22 0207 08/24/22 0027  WBC 32.3* 33.9* 23.8*  HGB 9.4* 10.2* 9.0*  HCT 29.2* 31.6* 28.9*  PLT 198 227 194   Recent Labs  Lab 08/22/22 1424 08/23/22 0207 08/24/22 0027  NA  --  134* 135  K  --  4.3 4.5  CL  --  96* 96*  CO2  --  26 26  GLUCOSE  --  187* 102*  BUN  --  19 54*  CREATININE 7.26* 3.88* 6.83*  CALCIUM  --  8.8* 8.6*  MG  --   --  2.2  PHOS  --   --  6.8*    Pertinent Imaging: CT ABDOMEN PELVIS W CONTRAST Result Date: 08/12/2022 IMPRESSION: 1. Small volume retroperitoneal fluid within the left hemipelvis measuring greater than fluid density, suggestive of a retroperitoneal hematoma of indeterminate etiology. 2. Long segment mild circumferential wall thickening of the sigmoid colon, and to a lesser degree involving the descending colon. Findings are suggestive of an infectious or inflammatory colitis. 3. Trace perihepatic ascites. 4. Trace bilateral pleural effusions. 5. Severe aortoiliac atherosclerosis (ICD10-I70.0).   DG Chest Port 1 View Result Date: 08/12/2022 IMPRESSION: 1. Unchanged cardiomegaly with  prominent bilateral interstitial opacities, which could represent pulmonary venous congestion or pulmonary edema. 2. No focal airspace opacity.   PERIPHERAL VASCULAR CATHETERIZATION Result Date: 08/10/2022 FINDINGS: The infrarenal aorta is patent with no  significant stenosis identified.  The renal arteries are patent with no significant renal artery stenosis identified. On the right side the common iliac and external iliac arteries are patent. On the left side the common iliac artery is patent and it has previously been stented.  There was diffuse disease in the external iliac artery with up to an 80% stenosis.  This was successfully addressed with angioplasty and stenting as described above with no residual stenosis. Low that the common femoral had calcific disease but was patent.  The deep femoral artery is patent.  The superficial femoral artery is occluded at its origin with reconstitution of the above-knee popliteal artery.  The patient has diffuse tibial disease.  The dominant runoff is the peroneal artery which occludes above the foot.  The anterior tibial artery has diffuse disease proximally and then occludes in the mid leg.  There is reconstitution of the distal anterior tibial and dorsalis pedis artery on the left.  The posterior tibial artery is occluded and reconstitutes above the ankle with moderate disease.  VAS [place] LOWER EXTREMITY BYPASS GRAFT DUPLEX Result Date: 08/06/2022 Summary: Right: Patent right femoral to below knee bypass graft with no evidence for restenosis.  See table(s) above for measurements and observations. Electronically signed by [name] MD on 08/06/2022 at 10:02:51 AM.    Final    VAS [place] ABI WITH/WO TBI Result Date: 08/06/2022 Summary: Right: Resting right ankle-brachial index indicates noncompressible right lower extremity arteries. The right toe-brachial index is abnormal. Left: Resting left ankle-brachial index indicates moderate left lower extremity arterial disease. The left toe-brachial index is abnormal. *See table(s) above for measurements and observations.  Electronically signed by [name] MD on 08/06/2022 at 8:50:24 AM.    Final      Results/Tests Pending at Time of Discharge:  None  Discharge Medications:  Allergies as of 08/24/2022       Reactions   Benadryl [diphenhydramine] Rash   Lidocaine-prilocaine Rash   Caused red marks up and down arm        Medication List     STOP taking these medications    Entresto 24-26 MG Generic drug: sacubitril-valsartan       TAKE these medications    albuterol 108 (90 Base) MCG/ACT inhaler Commonly known as: VENTOLIN HFA INHALE 1-2 PUFFS INTO THE LUNGS EVERY 6 (SIX) HOURS AS NEEDED FOR WHEEZING OR SHORTNESS OF BREATH.   aspirin EC 81 MG tablet Take 1 tablet (81 mg total) by mouth daily. Swallow whole.   calcium acetate 667 MG capsule Commonly known as: PHOSLO Take 2 capsule by mouth three times a day with meals What changed:  how much to take when to take this   clopidogrel 75 MG tablet Commonly known as: Plavix Take 1 tablet (75 mg total) by mouth daily.   Dialyvite/Zinc Tabs Take 1 tablet by mouth daily.   hydrOXYzine 10 MG tablet Commonly known as: ATARAX Take 10 mg by mouth 3 (three) times daily.   metoprolol succinate 25 MG 24 hr tablet Commonly known as: TOPROL-XL Take 1 tablet (25 mg total) by mouth daily.   oxyCODONE-acetaminophen 5-325 MG tablet Commonly known as: PERCOCET/ROXICET Take 1-2 tablets by mouth every 6 (six) hours as needed for up to 3 days for moderate pain.   rosuvastatin 10  MG tablet Commonly known as: Crestor Take 1 tablet (10 mg total) by mouth daily.        Discharge Instructions: Please refer to Patient Instructions section of EMR for full details.  Patient was counseled important signs and symptoms that should prompt return to medical care, changes in medications, dietary instructions, activity restrictions, and follow up appointments.   Follow-Up Appointments: To schedule with PCP within 1 week   Colletta Maryland, MD 08/24/2022, 12:20 PM PGY-1, [place]

## 2022-08-24 NOTE — Discharge Instructions (Addendum)
 [facility]  Discharge instructions  Lower Extremity Bypass Surgery  Please refer to the following instruction for your post-procedure care. Your surgeon or physician assistant will discuss any changes with you.  Activity  You are encouraged to walk as much as you can. You can slowly return to normal activities during the month after your surgery. Avoid strenuous activity and heavy lifting until your doctor tells you it's OK. Avoid activities such as vacuuming or swinging a golf club. Do not drive until your doctor give the OK and you are no longer taking prescription pain medications. It is also normal to have difficulty with sleep habits, eating and bowel movement after surgery. These will go away with time.  Bathing/Showering  Shower daily after you go home. Do not soak in a bathtub, hot tub, or swim until the incision heals completely.  Incision Care  Clean your incision with mild soap and water. Shower every day. Pat the area dry with a clean towel. You do not need a bandage unless otherwise instructed. Do not apply any ointments or creams to your incision. If you have open wounds you will be instructed how to care for them or a visiting nurse may be arranged for you. If you have staples or sutures along your incision they will be removed at your post-op appointment. You may have skin glue on your incision. Do not peel it off. It will come off on its own in about one week.  Wash the groin wound with soap and water daily and pat dry. (No tub bath-only shower)  Then put a dry gauze or washcloth in the groin to keep this area dry to help prevent wound infection.  Do this daily and as needed.  Do not use Vaseline or neosporin on your incisions.  Only use soap and water on your incisions and then protect and keep dry.  Diet  Resume your normal diet. There are no special food restrictions following this procedure. A low fat/ low cholesterol diet is  recommended for all patients with vascular disease. In order to heal from your surgery, it is CRITICAL to get adequate nutrition. Your body requires vitamins, minerals, and protein. Vegetables are the best source of vitamins and minerals. Vegetables also provide the perfect balance of protein. Processed food has little nutritional value, so try to avoid this.  Medications  Resume taking all your medications unless your doctor or physician assistant tells you not to. If your incision is causing pain, you may take over-the-counter pain relievers such as acetaminophen (Tylenol). If you were prescribed a stronger pain medication, please aware these medication can cause nausea and constipation. Prevent nausea by taking the medication with a snack or meal. Avoid constipation by drinking plenty of fluids and eating foods with high amount of fiber, such as fruits, vegetables, and grains. Take Colace 100 mg (an over-the-counter stool softener) twice a day as needed for constipation.  Do not take Tylenol if you are taking prescription pain medications.  Follow Up  Our office will schedule a follow up appointment 2-3 weeks following discharge.  Please call us immediately for any of the following conditions  Severe or worsening pain in your legs or feet while at rest or while walking Increase pain, redness, warmth, or drainage (pus) from your incision site(s) Fever of 101 degree or higher The swelling in your leg with the bypass suddenly worsens and becomes more painful than when you were in the hospital If you have   been instructed to feel your graft pulse then you should do so every day. If you can no longer feel this pulse, call the office immediately. Not all patients are given this instruction.  Leg swelling is common after leg bypass surgery.  The swelling should improve over a few months following surgery. To improve the swelling, you may elevate your legs above the level of your heart while you are  sitting or resting. Your surgeon or physician assistant may ask you to apply an ACE wrap or wear compression (TED) stockings to help to reduce swelling.  Reduce your risk of vascular disease  Stop smoking. If you would like help call [facility] at 1-800-QUIT-NOW 7046314553) or [place] at [phone].  Manage your cholesterol Maintain a desired weight Control your diabetes weight Control your diabetes Keep your blood pressure down  If you have any questions, please call the office at [phone]   Dear Albert Harris,   Thank you for letting us participate in your care! In this section, you will find a brief hospital admission summary of why you were admitted to the hospital, what happened during your admission, your diagnosis/diagnoses, and recommended follow up.  Primary diagnosis: Critical limb ischemia Treatment plan: You underwent surgery to fix the blockage in your leg with Vascular Surgery. After surgery you blood pressure was low and you went to the ICU to receive support for your blood pressure. Your blood pressure improved and stable for discharge.   POST-HOSPITAL & CARE INSTRUCTIONS We recommend following up with your PCP within 1 week from being discharged from the hospital. Please let PCP/Specialists know of any changes in medications that were made which you will be able to see in the medications section of this packet. Please also follow up with Vascular Surgery in 2-3 weeks for check of your incision  DOCTOR'S APPOINTMENTS & FOLLOW UP No future appointments.   Thank you for choosing [facility]! Take care and be well!  [facility]  [place], [place] [address] [phone]

## 2022-08-25 ENCOUNTER — Telehealth (INDEPENDENT_AMBULATORY_CARE_PROVIDER_SITE_OTHER): Payer: Self-pay

## 2022-08-25 LAB — HEPATITIS B SURFACE ANTIBODY, QUANTITATIVE: Hep B S AB Quant (Post): 22.1 m[IU]/mL (ref >9.9)

## 2022-08-25 NOTE — Transitions of Care (Post Inpatient/ED Visit) (Unsigned)
   08/25/2022  Name: Albert Harris MRN: [id number] DOB: 1955/10/10  Today's TOC FU Call Status: Today's TOC FU Call Status:: Unsuccessul Call (1st Attempt) Unsuccessful Call (1st Attempt) Date: 08/25/22  Attempted to reach the patient regarding the most recent Inpatient/ED visit.  Follow Up Plan: Additional outreach attempts will be made to reach the patient to complete the Transitions of Care (Post Inpatient/ED visit) call.   [place] LPN [place] Advisor Direct Dial [phone]

## 2022-08-26 NOTE — Transitions of Care (Post Inpatient/ED Visit) (Unsigned)
   08/26/2022  Name: Albert Harris MRN: [id number] DOB: 02/22/1956  Today's TOC FU Call Status: Today's TOC FU Call Status:: Unsuccessful Call (2nd Attempt) Unsuccessful Call (1st Attempt) Date: 08/25/22 Unsuccessful Call (2nd Attempt) Date: 08/26/22  Attempted to reach the patient regarding the most recent Inpatient/ED visit.  Follow Up Plan: Additional outreach attempts will be made to reach the patient to complete the Transitions of Care (Post Inpatient/ED visit) call.   [place] LPN [place] Advisor Direct Dial [phone]

## 2022-08-27 NOTE — Transitions of Care (Post Inpatient/ED Visit) (Signed)
   08/27/2022  Name: Albert Harris MRN: [id number] DOB: 29-Dec-1955  Today's TOC FU Call Status: Today's TOC FU Call Status:: Unsuccessful Call (3rd Attempt) Unsuccessful Call (1st Attempt) Date: 08/25/22 Unsuccessful Call (2nd Attempt) Date: 08/26/22 Unsuccessful Call (3rd Attempt) Date: 08/27/22  Attempted to reach the patient regarding the most recent Inpatient/ED visit.  Follow Up Plan: No further outreach attempts will be made at this time. We have been unable to contact the patient.  [place] LPN [place] Advisor Direct Dial [phone]

## 2022-08-28 ENCOUNTER — Emergency Department (HOSPITAL_COMMUNITY): Payer: Medicare Other

## 2022-08-28 ENCOUNTER — Inpatient Hospital Stay (HOSPITAL_COMMUNITY)
Admission: EM | Admit: 2022-08-28 | Discharge: 2022-10-22 | DRG: 252 | Disposition: A | Discharge status: Hospice | Payer: Medicare Other | Attending: Internal Medicine | Admitting: Internal Medicine

## 2022-08-28 ENCOUNTER — Encounter (HOSPITAL_COMMUNITY)
Admission: EM | Disposition: A | Discharge status: Hospice | Payer: Self-pay | Source: Home / Self Care | Attending: Internal Medicine

## 2022-08-28 ENCOUNTER — Telehealth: Payer: Self-pay

## 2022-08-28 DIAGNOSIS — I723 Aneurysm of iliac artery: Secondary | ICD-10-CM | POA: Diagnosis present

## 2022-08-28 DIAGNOSIS — I6381 Other cerebral infarction due to occlusion or stenosis of small artery: Secondary | ICD-10-CM | POA: Diagnosis not present

## 2022-08-28 DIAGNOSIS — I509 Heart failure, unspecified: Secondary | ICD-10-CM | POA: Diagnosis not present

## 2022-08-28 DIAGNOSIS — D126 Benign neoplasm of colon, unspecified: Secondary | ICD-10-CM | POA: Diagnosis not present

## 2022-08-28 DIAGNOSIS — Z8616 Personal history of COVID-19: Secondary | ICD-10-CM

## 2022-08-28 DIAGNOSIS — Z5941 Food insecurity: Secondary | ICD-10-CM

## 2022-08-28 DIAGNOSIS — N189 Chronic kidney disease, unspecified: Secondary | ICD-10-CM | POA: Diagnosis not present

## 2022-08-28 DIAGNOSIS — J9601 Acute respiratory failure with hypoxia: Secondary | ICD-10-CM | POA: Diagnosis present

## 2022-08-28 DIAGNOSIS — I083 Combined rheumatic disorders of mitral, aortic and tricuspid valves: Secondary | ICD-10-CM | POA: Diagnosis present

## 2022-08-28 DIAGNOSIS — K683 Retroperitoneal hematoma: Secondary | ICD-10-CM | POA: Diagnosis present

## 2022-08-28 DIAGNOSIS — K649 Unspecified hemorrhoids: Secondary | ICD-10-CM | POA: Diagnosis not present

## 2022-08-28 DIAGNOSIS — Z66 Do not resuscitate: Secondary | ICD-10-CM | POA: Diagnosis not present

## 2022-08-28 DIAGNOSIS — I743 Embolism and thrombosis of arteries of the lower extremities: Secondary | ICD-10-CM | POA: Diagnosis not present

## 2022-08-28 DIAGNOSIS — E8809 Other disorders of plasma-protein metabolism, not elsewhere classified: Secondary | ICD-10-CM | POA: Diagnosis present

## 2022-08-28 DIAGNOSIS — Y832 Surgical operation with anastomosis, bypass or graft as the cause of abnormal reaction of the patient, or of later complication, without mention of misadventure at the time of the procedure: Secondary | ICD-10-CM | POA: Diagnosis not present

## 2022-08-28 DIAGNOSIS — M79672 Pain in left foot: Secondary | ICD-10-CM | POA: Diagnosis not present

## 2022-08-28 DIAGNOSIS — R6521 Severe sepsis with septic shock: Principal | ICD-10-CM

## 2022-08-28 DIAGNOSIS — K5731 Diverticulosis of large intestine without perforation or abscess with bleeding: Secondary | ICD-10-CM | POA: Diagnosis present

## 2022-08-28 DIAGNOSIS — R042 Hemoptysis: Secondary | ICD-10-CM | POA: Diagnosis not present

## 2022-08-28 DIAGNOSIS — I724 Aneurysm of artery of lower extremity: Secondary | ICD-10-CM

## 2022-08-28 DIAGNOSIS — D62 Acute posthemorrhagic anemia: Secondary | ICD-10-CM

## 2022-08-28 DIAGNOSIS — E872 Acidosis, unspecified: Secondary | ICD-10-CM

## 2022-08-28 DIAGNOSIS — R29703 NIHSS score 3: Secondary | ICD-10-CM | POA: Diagnosis not present

## 2022-08-28 DIAGNOSIS — R001 Bradycardia, unspecified: Secondary | ICD-10-CM | POA: Diagnosis not present

## 2022-08-28 DIAGNOSIS — I5032 Chronic diastolic (congestive) heart failure: Secondary | ICD-10-CM | POA: Diagnosis present

## 2022-08-28 DIAGNOSIS — I70221 Atherosclerosis of native arteries of extremities with rest pain, right leg: Secondary | ICD-10-CM | POA: Diagnosis present

## 2022-08-28 DIAGNOSIS — D631 Anemia in chronic kidney disease: Secondary | ICD-10-CM | POA: Diagnosis present

## 2022-08-28 DIAGNOSIS — I729 Aneurysm of unspecified site: Secondary | ICD-10-CM | POA: Diagnosis not present

## 2022-08-28 DIAGNOSIS — K640 First degree hemorrhoids: Secondary | ICD-10-CM | POA: Diagnosis not present

## 2022-08-28 DIAGNOSIS — E785 Hyperlipidemia, unspecified: Secondary | ICD-10-CM | POA: Diagnosis present

## 2022-08-28 DIAGNOSIS — L89150 Pressure ulcer of sacral region, unstageable: Secondary | ICD-10-CM | POA: Diagnosis present

## 2022-08-28 DIAGNOSIS — Z01810 Encounter for preprocedural cardiovascular examination: Secondary | ICD-10-CM | POA: Diagnosis not present

## 2022-08-28 DIAGNOSIS — A419 Sepsis, unspecified organism: Secondary | ICD-10-CM | POA: Diagnosis not present

## 2022-08-28 DIAGNOSIS — Z515 Encounter for palliative care: Secondary | ICD-10-CM

## 2022-08-28 DIAGNOSIS — I97638 Postprocedural hematoma of a circulatory system organ or structure following other circulatory system procedure: Secondary | ICD-10-CM | POA: Diagnosis present

## 2022-08-28 DIAGNOSIS — Z539 Procedure and treatment not carried out, unspecified reason: Secondary | ICD-10-CM | POA: Diagnosis not present

## 2022-08-28 DIAGNOSIS — I998 Other disorder of circulatory system: Secondary | ICD-10-CM

## 2022-08-28 DIAGNOSIS — G9341 Metabolic encephalopathy: Secondary | ICD-10-CM | POA: Diagnosis present

## 2022-08-28 DIAGNOSIS — D123 Benign neoplasm of transverse colon: Secondary | ICD-10-CM | POA: Diagnosis present

## 2022-08-28 DIAGNOSIS — I132 Hypertensive heart and chronic kidney disease with heart failure and with stage 5 chronic kidney disease, or end stage renal disease: Secondary | ICD-10-CM | POA: Diagnosis present

## 2022-08-28 DIAGNOSIS — I953 Hypotension of hemodialysis: Secondary | ICD-10-CM | POA: Diagnosis not present

## 2022-08-28 DIAGNOSIS — N186 End stage renal disease: Secondary | ICD-10-CM | POA: Diagnosis present

## 2022-08-28 DIAGNOSIS — I340 Nonrheumatic mitral (valve) insufficiency: Secondary | ICD-10-CM | POA: Diagnosis not present

## 2022-08-28 DIAGNOSIS — Z888 Allergy status to other drugs, medicaments and biological substances status: Secondary | ICD-10-CM

## 2022-08-28 DIAGNOSIS — R252 Cramp and spasm: Secondary | ICD-10-CM | POA: Diagnosis not present

## 2022-08-28 DIAGNOSIS — B952 Enterococcus as the cause of diseases classified elsewhere: Secondary | ICD-10-CM | POA: Diagnosis present

## 2022-08-28 DIAGNOSIS — B9562 Methicillin resistant Staphylococcus aureus infection as the cause of diseases classified elsewhere: Secondary | ICD-10-CM | POA: Diagnosis not present

## 2022-08-28 DIAGNOSIS — K50111 Crohn's disease of large intestine with rectal bleeding: Secondary | ICD-10-CM | POA: Diagnosis not present

## 2022-08-28 DIAGNOSIS — I088 Other rheumatic multiple valve diseases: Secondary | ICD-10-CM | POA: Diagnosis not present

## 2022-08-28 DIAGNOSIS — R2689 Other abnormalities of gait and mobility: Secondary | ICD-10-CM

## 2022-08-28 DIAGNOSIS — T827XXA Infection and inflammatory reaction due to other cardiac and vascular devices, implants and grafts, initial encounter: Principal | ICD-10-CM | POA: Diagnosis present

## 2022-08-28 DIAGNOSIS — Z95820 Peripheral vascular angioplasty status with implants and grafts: Secondary | ICD-10-CM

## 2022-08-28 DIAGNOSIS — M79605 Pain in left leg: Secondary | ICD-10-CM

## 2022-08-28 DIAGNOSIS — J449 Chronic obstructive pulmonary disease, unspecified: Secondary | ICD-10-CM | POA: Diagnosis not present

## 2022-08-28 DIAGNOSIS — K626 Ulcer of anus and rectum: Secondary | ICD-10-CM | POA: Diagnosis not present

## 2022-08-28 DIAGNOSIS — E871 Hypo-osmolality and hyponatremia: Secondary | ICD-10-CM | POA: Diagnosis present

## 2022-08-28 DIAGNOSIS — L8990 Pressure ulcer of unspecified site, unspecified stage: Secondary | ICD-10-CM | POA: Insufficient documentation

## 2022-08-28 DIAGNOSIS — Z87891 Personal history of nicotine dependence: Secondary | ICD-10-CM

## 2022-08-28 DIAGNOSIS — Z91158 Patient's noncompliance with renal dialysis for other reason: Secondary | ICD-10-CM

## 2022-08-28 DIAGNOSIS — S31109A Unspecified open wound of abdominal wall, unspecified quadrant without penetration into peritoneal cavity, initial encounter: Secondary | ICD-10-CM | POA: Diagnosis not present

## 2022-08-28 DIAGNOSIS — R579 Shock, unspecified: Secondary | ICD-10-CM | POA: Diagnosis not present

## 2022-08-28 DIAGNOSIS — E876 Hypokalemia: Secondary | ICD-10-CM | POA: Diagnosis present

## 2022-08-28 DIAGNOSIS — R17 Unspecified jaundice: Secondary | ICD-10-CM | POA: Diagnosis present

## 2022-08-28 DIAGNOSIS — R54 Age-related physical debility: Secondary | ICD-10-CM | POA: Diagnosis present

## 2022-08-28 DIAGNOSIS — K5730 Diverticulosis of large intestine without perforation or abscess without bleeding: Secondary | ICD-10-CM | POA: Diagnosis not present

## 2022-08-28 DIAGNOSIS — T82598A Other mechanical complication of other cardiac and vascular devices and implants, initial encounter: Secondary | ICD-10-CM | POA: Diagnosis not present

## 2022-08-28 DIAGNOSIS — A4102 Sepsis due to Methicillin resistant Staphylococcus aureus: Secondary | ICD-10-CM | POA: Diagnosis present

## 2022-08-28 DIAGNOSIS — K633 Ulcer of intestine: Secondary | ICD-10-CM | POA: Diagnosis not present

## 2022-08-28 DIAGNOSIS — I70262 Atherosclerosis of native arteries of extremities with gangrene, left leg: Secondary | ICD-10-CM | POA: Diagnosis present

## 2022-08-28 DIAGNOSIS — Z800 Family history of malignant neoplasm of digestive organs: Secondary | ICD-10-CM

## 2022-08-28 DIAGNOSIS — K529 Noninfective gastroenteritis and colitis, unspecified: Secondary | ICD-10-CM | POA: Diagnosis not present

## 2022-08-28 DIAGNOSIS — T8131XA Disruption of external operation (surgical) wound, not elsewhere classified, initial encounter: Secondary | ICD-10-CM | POA: Diagnosis present

## 2022-08-28 DIAGNOSIS — Z8249 Family history of ischemic heart disease and other diseases of the circulatory system: Secondary | ICD-10-CM

## 2022-08-28 DIAGNOSIS — K922 Gastrointestinal hemorrhage, unspecified: Secondary | ICD-10-CM | POA: Diagnosis not present

## 2022-08-28 DIAGNOSIS — N2581 Secondary hyperparathyroidism of renal origin: Secondary | ICD-10-CM | POA: Diagnosis present

## 2022-08-28 DIAGNOSIS — K5900 Constipation, unspecified: Secondary | ICD-10-CM | POA: Diagnosis present

## 2022-08-28 DIAGNOSIS — I739 Peripheral vascular disease, unspecified: Secondary | ICD-10-CM | POA: Diagnosis not present

## 2022-08-28 DIAGNOSIS — R1084 Generalized abdominal pain: Secondary | ICD-10-CM

## 2022-08-28 DIAGNOSIS — Z6828 Body mass index (BMI) 28.0-28.9, adult: Secondary | ICD-10-CM

## 2022-08-28 DIAGNOSIS — R52 Pain, unspecified: Secondary | ICD-10-CM | POA: Diagnosis not present

## 2022-08-28 DIAGNOSIS — F32A Depression, unspecified: Secondary | ICD-10-CM | POA: Diagnosis present

## 2022-08-28 DIAGNOSIS — I97618 Postprocedural hemorrhage and hematoma of a circulatory system organ or structure following other circulatory system procedure: Secondary | ICD-10-CM | POA: Diagnosis not present

## 2022-08-28 DIAGNOSIS — Z8673 Personal history of transient ischemic attack (TIA), and cerebral infarction without residual deficits: Secondary | ICD-10-CM

## 2022-08-28 DIAGNOSIS — R7881 Bacteremia: Secondary | ICD-10-CM

## 2022-08-28 DIAGNOSIS — E440 Moderate protein-calorie malnutrition: Secondary | ICD-10-CM | POA: Diagnosis present

## 2022-08-28 DIAGNOSIS — I6330 Cerebral infarction due to thrombosis of unspecified cerebral artery: Secondary | ICD-10-CM | POA: Diagnosis not present

## 2022-08-28 DIAGNOSIS — E46 Unspecified protein-calorie malnutrition: Secondary | ICD-10-CM | POA: Diagnosis not present

## 2022-08-28 DIAGNOSIS — I745 Embolism and thrombosis of iliac artery: Secondary | ICD-10-CM | POA: Diagnosis not present

## 2022-08-28 DIAGNOSIS — I63311 Cerebral infarction due to thrombosis of right middle cerebral artery: Secondary | ICD-10-CM | POA: Diagnosis not present

## 2022-08-28 DIAGNOSIS — R578 Other shock: Secondary | ICD-10-CM | POA: Diagnosis not present

## 2022-08-28 DIAGNOSIS — I272 Pulmonary hypertension, unspecified: Secondary | ICD-10-CM | POA: Diagnosis present

## 2022-08-28 DIAGNOSIS — K5190 Ulcerative colitis, unspecified, without complications: Secondary | ICD-10-CM | POA: Diagnosis not present

## 2022-08-28 DIAGNOSIS — K625 Hemorrhage of anus and rectum: Secondary | ICD-10-CM

## 2022-08-28 DIAGNOSIS — M4622 Osteomyelitis of vertebra, cervical region: Secondary | ICD-10-CM | POA: Diagnosis present

## 2022-08-28 DIAGNOSIS — K921 Melena: Secondary | ICD-10-CM

## 2022-08-28 DIAGNOSIS — T82868A Thrombosis of vascular prosthetic devices, implants and grafts, initial encounter: Secondary | ICD-10-CM | POA: Diagnosis not present

## 2022-08-28 DIAGNOSIS — M545 Low back pain, unspecified: Secondary | ICD-10-CM | POA: Diagnosis not present

## 2022-08-28 DIAGNOSIS — Z79899 Other long term (current) drug therapy: Secondary | ICD-10-CM

## 2022-08-28 DIAGNOSIS — Z992 Dependence on renal dialysis: Secondary | ICD-10-CM

## 2022-08-28 DIAGNOSIS — M79671 Pain in right foot: Secondary | ICD-10-CM | POA: Diagnosis not present

## 2022-08-28 DIAGNOSIS — J45909 Unspecified asthma, uncomplicated: Secondary | ICD-10-CM | POA: Diagnosis not present

## 2022-08-28 DIAGNOSIS — N185 Chronic kidney disease, stage 5: Secondary | ICD-10-CM | POA: Diagnosis not present

## 2022-08-28 DIAGNOSIS — E875 Hyperkalemia: Secondary | ICD-10-CM | POA: Diagnosis present

## 2022-08-28 DIAGNOSIS — I10 Essential (primary) hypertension: Secondary | ICD-10-CM | POA: Diagnosis not present

## 2022-08-28 DIAGNOSIS — L89626 Pressure-induced deep tissue damage of left heel: Secondary | ICD-10-CM | POA: Diagnosis present

## 2022-08-28 DIAGNOSIS — R5381 Other malaise: Secondary | ICD-10-CM | POA: Diagnosis present

## 2022-08-28 DIAGNOSIS — J4489 Other specified chronic obstructive pulmonary disease: Secondary | ICD-10-CM | POA: Diagnosis present

## 2022-08-28 DIAGNOSIS — Z7902 Long term (current) use of antithrombotics/antiplatelets: Secondary | ICD-10-CM

## 2022-08-28 DIAGNOSIS — D6489 Other specified anemias: Secondary | ICD-10-CM | POA: Diagnosis present

## 2022-08-28 DIAGNOSIS — T82818A Embolism of vascular prosthetic devices, implants and grafts, initial encounter: Secondary | ICD-10-CM | POA: Diagnosis not present

## 2022-08-28 DIAGNOSIS — Z5982 Transportation insecurity: Secondary | ICD-10-CM

## 2022-08-28 DIAGNOSIS — R0989 Other specified symptoms and signs involving the circulatory and respiratory systems: Secondary | ICD-10-CM | POA: Diagnosis present

## 2022-08-28 DIAGNOSIS — Z7189 Other specified counseling: Secondary | ICD-10-CM | POA: Diagnosis not present

## 2022-08-28 DIAGNOSIS — R911 Solitary pulmonary nodule: Secondary | ICD-10-CM | POA: Diagnosis present

## 2022-08-28 DIAGNOSIS — G8929 Other chronic pain: Secondary | ICD-10-CM | POA: Diagnosis present

## 2022-08-28 DIAGNOSIS — I110 Hypertensive heart disease with heart failure: Secondary | ICD-10-CM | POA: Diagnosis not present

## 2022-08-28 DIAGNOSIS — I6523 Occlusion and stenosis of bilateral carotid arteries: Secondary | ICD-10-CM | POA: Diagnosis not present

## 2022-08-28 DIAGNOSIS — L89152 Pressure ulcer of sacral region, stage 2: Secondary | ICD-10-CM | POA: Diagnosis present

## 2022-08-28 DIAGNOSIS — E162 Hypoglycemia, unspecified: Secondary | ICD-10-CM | POA: Diagnosis present

## 2022-08-28 HISTORY — PX: APPLICATION OF WOUND VAC: SHX5189

## 2022-08-28 HISTORY — PX: ARTERY REPAIR: SHX5117

## 2022-08-28 LAB — GLUCOSE, CAPILLARY: Glucose-Capillary: 132 mg/dL — ABNORMAL HIGH (ref 70–99)

## 2022-08-28 LAB — CBC WITH DIFFERENTIAL/PLATELET
Abs Immature Granulocytes: 0.19 10*3/uL — ABNORMAL HIGH (ref 0.00–0.07)
Basophils Absolute: 0.1 10*3/uL (ref 0.0–0.1)
Basophils Relative: 1 %
Eosinophils Absolute: 0.1 10*3/uL (ref 0.0–0.5)
Eosinophils Relative: 1 %
HCT: 27.2 % — ABNORMAL LOW (ref 39.0–52.0)
Hemoglobin: 8.7 g/dL — ABNORMAL LOW (ref 13.0–17.0)
Immature Granulocytes: 1 %
Lymphocytes Relative: 8 %
Lymphs Abs: 1.5 10*3/uL (ref 0.7–4.0)
MCH: 34.4 pg — ABNORMAL HIGH (ref 26.0–34.0)
MCHC: 32 g/dL (ref 30.0–36.0)
MCV: 107.5 fL — ABNORMAL HIGH (ref 80.0–100.0)
Monocytes Absolute: 0.7 10*3/uL (ref 0.1–1.0)
Monocytes Relative: 4 %
Neutro Abs: 16.6 10*3/uL — ABNORMAL HIGH (ref 1.7–7.7)
Neutrophils Relative %: 85 %
Platelets: 329 10*3/uL (ref 150–400)
RBC: 2.53 MIL/uL — ABNORMAL LOW (ref 4.22–5.81)
RDW: 15.7 % — ABNORMAL HIGH (ref 11.5–15.5)
WBC: 19.2 10*3/uL — ABNORMAL HIGH (ref 4.0–10.5)
nRBC: 0 % (ref 0.0–0.2)

## 2022-08-28 LAB — POCT I-STAT EG7
Acid-base deficit: 13 mmol/L — ABNORMAL HIGH (ref 0.0–2.0)
Bicarbonate: 14.2 mmol/L — ABNORMAL LOW (ref 20.0–28.0)
Calcium, Ion: 1.17 mmol/L (ref 1.15–1.40)
HCT: 22 % — ABNORMAL LOW (ref 39.0–52.0)
Hemoglobin: 7.5 g/dL — ABNORMAL LOW (ref 13.0–17.0)
O2 Saturation: 60 %
Potassium: 6 mmol/L — ABNORMAL HIGH (ref 3.5–5.1)
Sodium: 132 mmol/L — ABNORMAL LOW (ref 135–145)
TCO2: 15 mmol/L — ABNORMAL LOW (ref 22–32)
pCO2, Ven: 37.6 mmHg — ABNORMAL LOW (ref 44–60)
pH, Ven: 7.186 — CL (ref 7.25–7.43)
pO2, Ven: 38 mmHg (ref 32–45)

## 2022-08-28 LAB — MRSA NEXT GEN BY PCR, NASAL: MRSA by PCR Next Gen: NOT DETECTED

## 2022-08-28 LAB — COMPREHENSIVE METABOLIC PANEL
ALT: <5 U/L (ref 0–44)
AST: 21 U/L (ref 15–41)
Albumin: 2.9 g/dL — ABNORMAL LOW (ref 3.5–5.0)
Alkaline Phosphatase: 122 U/L (ref 38–126)
Anion gap: 31 — ABNORMAL HIGH (ref 5–15)
BUN: 134 mg/dL — ABNORMAL HIGH (ref 8–23)
CO2: 13 mmol/L — ABNORMAL LOW (ref 22–32)
Calcium: 9.8 mg/dL (ref 8.9–10.3)
Chloride: 88 mmol/L — ABNORMAL LOW (ref 98–111)
Creatinine, Ser: 15.92 mg/dL — ABNORMAL HIGH (ref 0.61–1.24)
GFR, Estimated: 3 mL/min — ABNORMAL LOW (ref >60)
Glucose, Bld: 183 mg/dL — ABNORMAL HIGH (ref 70–99)
Potassium: 6.9 mmol/L (ref 3.5–5.1)
Sodium: 132 mmol/L — ABNORMAL LOW (ref 135–145)
Total Bilirubin: 1.2 mg/dL (ref 0.3–1.2)
Total Protein: 7.4 g/dL (ref 6.5–8.1)

## 2022-08-28 LAB — LACTIC ACID, PLASMA
Lactic Acid, Venous: 8.5 mmol/L (ref 0.5–1.9)
Lactic Acid, Venous: >9 mmol/L (ref 0.5–1.9)

## 2022-08-28 LAB — LIPASE, BLOOD: Lipase: 23 U/L (ref 11–51)

## 2022-08-28 LAB — POTASSIUM: Potassium: 6 mmol/L — ABNORMAL HIGH (ref 3.5–5.1)

## 2022-08-28 LAB — CBG MONITORING, ED: Glucose-Capillary: 181 mg/dL — ABNORMAL HIGH (ref 70–99)

## 2022-08-28 SURGERY — GROIN EXPOSURE
Anesthesia: General | Site: Groin | Laterality: Left

## 2022-08-28 MED ORDER — NOREPINEPHRINE 4 MG/250ML-% IV SOLN
2.0000 ug/min | INTRAVENOUS | Status: DC
  Administered 2022-08-28: 4 ug/min via INTRAVENOUS
  Filled 2022-08-28: qty 250

## 2022-08-28 MED ORDER — FENTANYL CITRATE PF 50 MCG/ML IJ SOSY
50.0000 ug | PREFILLED_SYRINGE | Freq: Once | INTRAMUSCULAR | Status: AC
  Administered 2022-08-28: 50 ug via INTRAVENOUS
  Filled 2022-08-28: qty 1

## 2022-08-28 MED ORDER — SODIUM CHLORIDE 0.9 % IV SOLN: INTRAVENOUS | Status: DC | PRN

## 2022-08-28 MED ORDER — MIDAZOLAM HCL (PF) 10 MG/2ML IJ SOLN
INTRAMUSCULAR | Status: AC
  Filled 2022-08-28: qty 2

## 2022-08-28 MED ORDER — POLYETHYLENE GLYCOL 3350 17 G PO PACK: 17.0000 g | PACK | Freq: Every day | ORAL | Status: DC | PRN

## 2022-08-28 MED ORDER — CALCIUM GLUCONATE-NACL 1-0.675 GM/50ML-% IV SOLN
1.0000 g | Freq: Once | INTRAVENOUS | Status: AC
  Administered 2022-08-28: 1000 mg via INTRAVENOUS
  Filled 2022-08-28: qty 50

## 2022-08-28 MED ORDER — SODIUM ZIRCONIUM CYCLOSILICATE 10 G PO PACK
10.0000 g | PACK | Freq: Once | ORAL | Status: DC
  Filled 2022-08-28: qty 1

## 2022-08-28 MED ORDER — SODIUM CHLORIDE 0.9% FLUSH: 10.0000 mL | INTRAVENOUS | Status: DC | PRN

## 2022-08-28 MED ORDER — HEPARIN SODIUM (PORCINE) 1000 UNIT/ML DIALYSIS
1000.0000 [IU] | INTRAMUSCULAR | Status: DC | PRN
  Administered 2022-08-30: 2400 [IU] via INTRAVENOUS_CENTRAL
  Administered 2022-09-01: 3000 [IU] via INTRAVENOUS_CENTRAL
  Filled 2022-08-28: qty 6
  Filled 2022-08-28: qty 4
  Filled 2022-08-28: qty 6
  Filled 2022-08-28: qty 3

## 2022-08-28 MED ORDER — PRISMASOL BGK 0/2.5 32-2.5 MEQ/L REPLACEMENT SOLN
Status: DC
  Filled 2022-08-28 (×7): qty 5000

## 2022-08-28 MED ORDER — IOHEXOL 350 MG/ML SOLN
85.0000 mL | Freq: Once | INTRAVENOUS | Status: AC | PRN
  Administered 2022-08-28: 85 mL via INTRAVENOUS

## 2022-08-28 MED ORDER — SODIUM CHLORIDE 0.9 % IV SOLN
250.0000 mL | INTRAVENOUS | Status: DC
  Administered 2022-08-31 – 2022-09-11 (×2): 250 mL via INTRAVENOUS

## 2022-08-28 MED ORDER — ONDANSETRON HCL 4 MG/2ML IJ SOLN
4.0000 mg | Freq: Four times a day (QID) | INTRAMUSCULAR | Status: DC | PRN
  Administered 2022-09-07: 4 mg via INTRAVENOUS

## 2022-08-28 MED ORDER — CHLORHEXIDINE GLUCONATE CLOTH 2 % EX PADS
6.0000 | MEDICATED_PAD | Freq: Every day | CUTANEOUS | Status: DC
  Administered 2022-08-28 – 2022-09-01 (×5): 6 via TOPICAL

## 2022-08-28 MED ORDER — PRISMASOL BGK 0/2.5 32-2.5 MEQ/L EC SOLN
Status: DC
  Filled 2022-08-28 (×12): qty 5000

## 2022-08-28 MED ORDER — MIDAZOLAM HCL 2 MG/2ML IJ SOLN
INTRAMUSCULAR | Status: AC
  Filled 2022-08-28: qty 2

## 2022-08-28 MED ORDER — SODIUM CHLORIDE 0.9 % FOR CRRT: INTRAVENOUS_CENTRAL | Status: DC | PRN

## 2022-08-28 MED ORDER — LACTATED RINGERS IV BOLUS
500.0000 mL | Freq: Once | INTRAVENOUS | Status: AC
  Administered 2022-08-28: 500 mL via INTRAVENOUS

## 2022-08-28 MED ORDER — DEXTROSE 50 % IV SOLN
1.0000 | Freq: Once | INTRAVENOUS | Status: AC
  Administered 2022-08-28: 50 mL via INTRAVENOUS
  Filled 2022-08-28: qty 50

## 2022-08-28 MED ORDER — SODIUM BICARBONATE 8.4 % IV SOLN
50.0000 meq | Freq: Once | INTRAVENOUS | Status: AC
  Administered 2022-08-28: 50 meq via INTRAVENOUS
  Filled 2022-08-28: qty 50

## 2022-08-28 MED ORDER — CALCIUM GLUCONATE-NACL 1-0.675 GM/50ML-% IV SOLN
1.0000 g | Freq: Once | INTRAVENOUS | Status: AC
  Administered 2022-08-28: 1000 mg via INTRAVENOUS

## 2022-08-28 MED ORDER — INSULIN ASPART 100 UNIT/ML IV SOLN
5.0000 [IU] | Freq: Once | INTRAVENOUS | Status: AC
  Administered 2022-08-28: 5 [IU] via INTRAVENOUS

## 2022-08-28 MED ORDER — ONDANSETRON HCL 4 MG/2ML IJ SOLN
4.0000 mg | Freq: Once | INTRAMUSCULAR | Status: AC
  Administered 2022-08-28: 4 mg via INTRAVENOUS
  Filled 2022-08-28: qty 2

## 2022-08-28 MED ORDER — ALBUTEROL (5 MG/ML) CONTINUOUS INHALATION SOLN
20.0000 mg | INHALATION_SOLUTION | RESPIRATORY_TRACT | Status: DC
  Administered 2022-08-28: 20 mg via RESPIRATORY_TRACT
  Filled 2022-08-28: qty 20

## 2022-08-28 MED ORDER — SODIUM CHLORIDE 0.9% FLUSH
10.0000 mL | Freq: Two times a day (BID) | INTRAVENOUS | Status: DC
  Administered 2022-08-29: 30 mL
  Administered 2022-08-29: 10 mL
  Administered 2022-08-30: 40 mL
  Administered 2022-08-30: 10 mL
  Administered 2022-08-31: 40 mL
  Administered 2022-08-31 – 2022-09-29 (×48): 10 mL
  Administered 2022-09-30: 40 mL
  Administered 2022-09-30: 10 mL
  Administered 2022-10-01: 20 mL
  Administered 2022-10-01: 40 mL
  Administered 2022-10-02: 20 mL
  Administered 2022-10-02 – 2022-10-06 (×7): 10 mL
  Administered 2022-10-07: 30 mL
  Administered 2022-10-07: 20 mL
  Administered 2022-10-08 – 2022-10-19 (×21): 10 mL
  Administered 2022-10-20: 20 mL
  Administered 2022-10-21: 10 mL

## 2022-08-28 MED ORDER — SODIUM CHLORIDE 0.9 % IV BOLUS
500.0000 mL | Freq: Once | INTRAVENOUS | Status: AC
  Administered 2022-08-28: 500 mL via INTRAVENOUS

## 2022-08-28 MED ORDER — DOCUSATE SODIUM 100 MG PO CAPS: 100.0000 mg | ORAL_CAPSULE | Freq: Two times a day (BID) | ORAL | Status: DC | PRN

## 2022-08-28 MED ORDER — FENTANYL CITRATE (PF) 250 MCG/5ML IJ SOLN
INTRAMUSCULAR | Status: AC
  Filled 2022-08-28: qty 5

## 2022-08-28 MED ORDER — LACTATED RINGERS IV BOLUS: 1000.0000 mL | Freq: Once | INTRAVENOUS | Status: DC

## 2022-08-28 SURGICAL SUPPLY — 32 items
ADH SKN CLS APL DERMABOND .7 (GAUZE/BANDAGES/DRESSINGS) ×2
CANISTER SUCT 3000ML PPV (MISCELLANEOUS) ×3 IMPLANT
CATH EMB 4FR 40 (CATHETERS) IMPLANT
DERMABOND ADVANCED .7 DNX12 (GAUZE/BANDAGES/DRESSINGS) ×3 IMPLANT
DRAPE INCISE IOBAN 66X45 STRL (DRAPES) IMPLANT
DRESSING PEEL AND PLC PRVNA 13 (GAUZE/BANDAGES/DRESSINGS) IMPLANT
DRSG PEEL AND PLACE PREVENA 13 (GAUZE/BANDAGES/DRESSINGS) ×2
GLOVE BIOGEL PI IND STRL 8 (GLOVE) ×3 IMPLANT
GOWN STRL REUS W/ TWL LRG LVL3 (GOWN DISPOSABLE) ×6 IMPLANT
GOWN STRL REUS W/TWL 2XL LVL3 (GOWN DISPOSABLE) ×6 IMPLANT
GOWN STRL REUS W/TWL LRG LVL3 (GOWN DISPOSABLE) ×4
KIT BASIN OR (CUSTOM PROCEDURE TRAY) ×3 IMPLANT
KIT TURNOVER KIT B (KITS) ×3 IMPLANT
NS IRRIG 1000ML POUR BTL (IV SOLUTION) ×3 IMPLANT
PACK PERIPHERAL VASCULAR (CUSTOM PROCEDURE TRAY) ×3 IMPLANT
PAD ARMBOARD 7.5X6 YLW CONV (MISCELLANEOUS) ×3 IMPLANT
POWDER MYRIAD MORCELLS 1000MG (Miscellaneous) IMPLANT
POWDER MYRIAD MORCELLS 500MG (Miscellaneous) IMPLANT
SLEEVE SURGEON STRL (DRAPES) IMPLANT
STAPLER VISISTAT 35W (STAPLE) IMPLANT
STOPCOCK 4 WAY LG BORE MALE ST (IV SETS) IMPLANT
SUT MNCRL AB 4-0 PS2 18 (SUTURE) ×3 IMPLANT
SUT PROLENE 5 0 C 1 24 (SUTURE) ×3 IMPLANT
SUT PROLENE 6 0 BV (SUTURE) ×3 IMPLANT
SUT SILK 2 0 PERMA HAND 18 BK (SUTURE) IMPLANT
SUT VIC AB 2-0 CT1 27 (SUTURE) ×4
SUT VIC AB 2-0 CT1 TAPERPNT 27 (SUTURE) ×3 IMPLANT
SUT VIC AB 3-0 SH 27 (SUTURE) ×2
SUT VIC AB 3-0 SH 27X BRD (SUTURE) ×3 IMPLANT
SYR 3ML LL SCALE MARK (SYRINGE) IMPLANT
TOWEL GREEN STERILE (TOWEL DISPOSABLE) ×3 IMPLANT
WATER STERILE IRR 1000ML POUR (IV SOLUTION) ×3 IMPLANT

## 2022-08-28 NOTE — ED Provider Notes (Incomplete)
[facility] Provider Note   CSN: [id number] Arrival date & time: 08/28/22  1620     History {Add pertinent medical, surgical, social history, OB history to HPI:1} Chief Complaint  Patient presents with  . Abdominal Pain    PERRIS Albert Harris is a 67 y.o. male PMH ESRD on HD (TTS), significant PVD requiring stent placement as well as bypass in RLE last year and stent placed 1 week ago and LLE nauseous and sweaty today. Patient states he had not thrown up until being with EMS. Patient reports not having a BM x 4 days. EMS reports patient radial pulses on left arm are thready due to attempting to have a fistula placed on that arm. Patient right arm is restricted. Patient CBG 232 w/ no hx of diabetes. Patient states can not feel left foot.    Abdominal Pain      Home Medications Prior to Admission medications   Medication Sig Start Date End Date Taking? Authorizing Provider  albuterol (VENTOLIN HFA) 108 (90 Base) MCG/ACT inhaler INHALE 1-2 PUFFS INTO THE LUNGS EVERY 6 (SIX) HOURS AS NEEDED FOR WHEEZING OR SHORTNESS OF BREATH. 07/02/20 08/22/23  Maness, Arnette Norris, MD  aspirin EC 81 MG tablet Take 1 tablet (81 mg total) by mouth daily. Swallow whole. Patient not taking: Reported on 08/22/2022 08/10/22 08/10/23  Angelia Mould, MD  B Complex-C-Zn-Folic Acid (DIALYVITE/ZINC) TABS Take 1 tablet by mouth daily. Patient not taking: Reported on 08/22/2022 04/25/22   [provider]  calcium acetate (PHOSLO) 667 MG capsule Take 2 capsule by mouth three times a day with meals Patient taking differently: Take 1,334 mg by mouth 3 (three) times daily with meals. 06/17/21     clopidogrel (PLAVIX) 75 MG tablet Take 1 tablet (75 mg total) by mouth daily. 08/24/22 09/23/22  Alen Bleacher, MD  hydrOXYzine (ATARAX) 10 MG tablet Take 10 mg by mouth 3 (three) times daily. 08/18/22   [provider]  metoprolol succinate (TOPROL-XL) 25 MG 24 hr tablet Take  1 tablet (25 mg total) by mouth daily. 07/01/22   Precious Gilding, DO  rosuvastatin (CRESTOR) 10 MG tablet Take 1 tablet (10 mg total) by mouth daily. Patient not taking: Reported on 08/13/2022 08/10/22 08/10/23  Angelia Mould, MD      Allergies    Benadryl [diphenhydramine] and Lidocaine-prilocaine    Review of Systems   Review of Systems  Gastrointestinal:  Positive for abdominal pain.    Physical Exam Updated Vital Signs BP (!) 126/115 (BP Location: Left Arm)   Pulse 65   Temp (!) 97.4 F (36.3 C) (Oral)   Resp 18   Ht 5\' 8"  (1.727 m)   Wt 72.6 kg   SpO2 94%   BMI 24.33 kg/m  Physical Exam  ED Results / Procedures / Treatments   Labs (all labs ordered are listed, but only abnormal results are displayed) Labs Reviewed - No data to display  EKG None  Radiology No results found.  Procedures Procedures  {Document cardiac monitor, telemetry assessment procedure when appropriate:1}  Medications Ordered in ED Medications - No data to display  ED Course/ Medical Decision Making/ A&P Clinical Course as of 08/28/22 1849  Fri Aug 28, 2022  1817 Per vascular  [CO]    Clinical Course User Index [CO] Bradd Canary, MD   {   Click here for ABCD2, HEART and other calculatorsREFRESH Note before signing :1}  Medical Decision Making  ***  {Document critical care time when appropriate:1} {Document review of labs and clinical decision tools ie heart score, Chads2Vasc2 etc:1}  {Document your independent review of radiology images, and any outside records:1} {Document your discussion with family members, caretakers, and with consultants:1} {Document social determinants of health affecting pt's care:1} {Document your decision making why or why not admission, treatments were needed:1} Final Clinical Impression(s) / ED Diagnoses Final diagnoses:  None    Rx / DC Orders ED Discharge Orders     None

## 2022-08-28 NOTE — Progress Notes (Signed)
Patient seen and examined at bedside. Blood pressure much improved Patient more communicative, stated his left leg felt better, sensory exam improved. Patient continues to have difficulty with strength in bilateral legs at the level of the ankle.  With this problem being unilateral, I do not think this is from ischemia. 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 Recent i-STAT demonstrated K decreased to 6 I had a long discussion with [name], his daughter, and other family members regarding the left-sided pseudoaneurysm.  My concern is that this is a major bleeding risk. After discussing the risk and benefits, client elected to proceed with left groin exploration, pseudoaneurysm repair. This was discussed at length with anesthesia as well.  Albert Harris is high risk due to his current clinical status and list of comorbidities.  I appreciate their help.  Broadus John MD

## 2022-08-28 NOTE — ED Provider Notes (Signed)
[facility] Provider Note   CSN: [id number] Arrival date & time: 08/28/22  1620     History {Add pertinent medical, surgical, social history, OB history to HPI:1} Chief Complaint  Patient presents with   Abdominal Pain    Albert Harris is a 67 y.o. male PMH of ESRD on HD (TTS)   Abdominal Pain      Home Medications Prior to Admission medications   Medication Sig Start Date End Date Taking? Authorizing Provider  albuterol (VENTOLIN HFA) 108 (90 Base) MCG/ACT inhaler INHALE 1-2 PUFFS INTO THE LUNGS EVERY 6 (SIX) HOURS AS NEEDED FOR WHEEZING OR SHORTNESS OF BREATH. 07/02/20 08/22/23  Maness, Arnette Norris, MD  aspirin EC 81 MG tablet Take 1 tablet (81 mg total) by mouth daily. Swallow whole. Patient not taking: Reported on 08/22/2022 08/10/22 08/10/23  Angelia Mould, MD  B Complex-C-Zn-Folic Acid (DIALYVITE/ZINC) TABS Take 1 tablet by mouth daily. Patient not taking: Reported on 08/22/2022 04/25/22   [provider]  calcium acetate (PHOSLO) 667 MG capsule Take 2 capsule by mouth three times a day with meals Patient taking differently: Take 1,334 mg by mouth 3 (three) times daily with meals. 06/17/21     clopidogrel (PLAVIX) 75 MG tablet Take 1 tablet (75 mg total) by mouth daily. 08/24/22 09/23/22  Alen Bleacher, MD  hydrOXYzine (ATARAX) 10 MG tablet Take 10 mg by mouth 3 (three) times daily. 08/18/22   [provider]  metoprolol succinate (TOPROL-XL) 25 MG 24 hr tablet Take 1 tablet (25 mg total) by mouth daily. 07/01/22   Precious Gilding, DO  rosuvastatin (CRESTOR) 10 MG tablet Take 1 tablet (10 mg total) by mouth daily. Patient not taking: Reported on 08/13/2022 08/10/22 08/10/23  Angelia Mould, MD      Allergies    Benadryl [diphenhydramine] and Lidocaine-prilocaine    Review of Systems   Review of Systems  Gastrointestinal:  Positive for abdominal pain.    Physical Exam Updated Vital Signs BP 127/78   Pulse 65    Temp (!) 97.3 F (36.3 C) (Oral)   Resp (!) 26   Ht 5\' 8"  (1.727 m)   Wt 72.6 kg   SpO2 (!) 81%   BMI 24.33 kg/m  Physical Exam  ED Results / Procedures / Treatments   Labs (all labs ordered are listed, but only abnormal results are displayed) Labs Reviewed  CBC WITH DIFFERENTIAL/PLATELET - Abnormal; Notable for the following components:      Result Value   WBC 19.2 (*)    RBC 2.53 (*)    Hemoglobin 8.7 (*)    HCT 27.2 (*)    MCV 107.5 (*)    MCH 34.4 (*)    RDW 15.7 (*)    Neutro Abs 16.6 (*)    Abs Immature Granulocytes 0.19 (*)    All other components within normal limits  COMPREHENSIVE METABOLIC PANEL - Abnormal; Notable for the following components:   Sodium 132 (*)    Potassium 6.9 (*)    Chloride 88 (*)    CO2 13 (*)    Glucose, Bld 183 (*)    BUN 134 (*)    Creatinine, Ser 15.92 (*)    Albumin 2.9 (*)    GFR, Estimated 3 (*)    Anion gap 31 (*)    All other components within normal limits  LACTIC ACID, PLASMA - Abnormal; Notable for the following components:   Lactic Acid, Venous 8.5 (*)  All other components within normal limits  LIPASE, BLOOD  LACTIC ACID, PLASMA    EKG None  Radiology No results found.  Procedures Procedures  {Document cardiac monitor, telemetry assessment procedure when appropriate:1}  Medications Ordered in ED Medications  calcium gluconate 1 g/ 50 mL sodium chloride IVPB (has no administration in time range)  insulin aspart (novoLOG) injection 5 Units (has no administration in time range)    And  dextrose 50 % solution 50 mL (has no administration in time range)  sodium bicarbonate injection 50 mEq (has no administration in time range)  sodium zirconium cyclosilicate (LOKELMA) packet 10 g (has no administration in time range)  fentaNYL (SUBLIMAZE) injection 50 mcg (50 mcg Intravenous Given 08/28/22 1816)  ondansetron (ZOFRAN) injection 4 mg (4 mg Intravenous Given 08/28/22 1813)  iohexol (OMNIPAQUE) 350 MG/ML injection  85 mL (85 mLs Intravenous Contrast Given 08/28/22 1841)    ED Course/ Medical Decision Making/ A&P Clinical Course as of 08/28/22 1923  Fri Aug 28, 2022  1817 Per vascular  [CO]    Clinical Course User Index [CO] Bradd Canary, MD   {   Click here for ABCD2, HEART and other calculatorsREFRESH Note before signing :1}                          Medical Decision Making Amount and/or Complexity of Data Reviewed Labs: ordered. Radiology: ordered.  Risk OTC drugs. Prescription drug management.   *** Lengthy discussion with patient, he has elected to be full code including intubation.  {Document critical care time when appropriate:1} {Document review of labs and clinical decision tools ie heart score, Chads2Vasc2 etc:1}  {Document your independent review of radiology images, and any outside records:1} {Document your discussion with family members, caretakers, and with consultants:1} {Document social determinants of health affecting pt's care:1} {Document your decision making why or why not admission, treatments were needed:1} Final Clinical Impression(s) / ED Diagnoses Final diagnoses:  None    Rx / DC Orders ED Discharge Orders     None

## 2022-08-28 NOTE — H&P (Signed)
NAME:  TRAXTON CATCHING, MRN:  [id number], DOB:  [birth year], LOS: 0 ADMISSION DATE:  08/28/2022, CONSULTATION DATE:  08/28/22 REFERRING MD: Jeannine Kitten , CHIEF COMPLAINT:  Abdominal pain  History of Present Illness:  67 yo man s/p L endarterectomy and thrombectomy, d/c 3/17, developed abdominal pain.  Brought to ed today for hypotension at home, found to have severe lactic acidosis, cold and pulseless L LE (popliteal and PT pulse absent).  Developed intermittent hypotension.   CT angio showed L fem pseudoaneurysm. Hyperkalemic.   HD cath placed in ED.  Plan for CRRT urgently then OR asap.    Takes plavix and asa at home Missed HD this week due to pain per notes.  Pertinent  Medical History  ESRD -AV fistula (no flow tonight) PAD COPD Hld HTN HFrEF 35-40%   A/p left common femoral, profundofemoral, superficial femoral and external iliac endarterectomy with bovine pericardial patch angioplasty and a left iliofemoral and profundofemoral thromboembolectomy.   Significant Hospital Events: Including procedures, antibiotic start and stop dates in addition to other pertinent events     Interim History / Subjective:    Objective   Blood pressure (!) 86/72, pulse 81, temperature (!) 97.3 F (36.3 C), temperature source Oral, resp. rate 16, height 5\' 8"  (1.727 m), weight 72.6 kg, SpO2 100 %.        Intake/Output Summary (Last 24 hours) at 08/28/2022 2216 Last data filed at 08/28/2022 2101 Gross per 24 hour  Intake 640.83 ml  Output --  Net 640.83 ml   Filed Weights   08/28/22 1632  Weight: 72.6 kg    Examination: General: pleasant, awake and alert  HENT: NCAT  Lungs: ctab  Cardiovascular: rrr  Abdomen: tender to minimal palpation throughout.  Extremities: cool and mottled LLE below knee  Neuro:  a and O x3  Skin: swelling and tenderness of L groin. Incision sutured, C/D/I R groin scar  Resolved Hospital Problem list     Assessment & Plan:  Lactic acidosis Ischemic  appearing L LE Abdominal tenderness Intermittent hypotension ESRD, hyperkalemia, uremia CT angio: L fem pseudoaneurysm Hematoma L groin   Emergent HD. Art line asap.  Fluids as tolerates.  Abd pain: unclear cause.  Possible transient ischemia during hypotensive episodes?     Best Practice (right click and "Reselect all SmartList Selections" daily)   Diet/type: NPO DVT prophylaxis: other plan for surgery  GI prophylaxis: PPI Lines: N/A Foley:  N/A Code Status:  full code Last date of multidisciplinary goals of care discussion []   Labs   CBC: Recent Labs  Lab 08/22/22 0614 08/22/22 0853 08/22/22 1424 08/23/22 0207 08/24/22 0027 08/28/22 1810  WBC 17.8*  --  32.3* 33.9* 23.8* 19.2*  NEUTROABS  --   --   --   --   --  16.6*  HGB 11.7* 15.0 9.4* 10.2* 9.0* 8.7*  HCT 36.3* 44.0 29.2* 31.6* 28.9* 27.2*  MCV 103.4*  --  105.8* 104.3* 108.2* 107.5*  PLT 206  --  198 227 194 [id number]    Basic Metabolic Panel: Recent Labs  Lab 08/22/22 0614 08/22/22 0853 08/22/22 1424 08/23/22 0207 08/24/22 0027 08/28/22 1810  NA 134* 135  --  134* 135 132*  K 4.5 4.4  --  4.3 4.5 6.9*  CL 94*  --   --  96* 96* 88*  CO2 25  --   --  26 26 13*  GLUCOSE 94  --   --  187* 102* 183*  BUN 29*  --   --  19 54* 134*  CREATININE 6.76*  --  7.26* 3.88* 6.83* 15.92*  CALCIUM 9.1  --   --  8.8* 8.6* 9.8  MG  --   --   --   --  2.2  --   PHOS  --   --   --   --  6.8*  --    GFR: Estimated Creatinine Clearance: 4.3 mL/min (A) (by C-G formula based on SCr of 15.92 mg/dL (H)). Recent Labs  Lab 08/22/22 1424 08/23/22 0207 08/24/22 0027 08/28/22 1810 08/28/22 1811 08/28/22 2050  WBC 32.3* 33.9* 23.8* 19.2*  --   --   LATICACIDVEN  --   --   --   --  8.5* >9.0*    Liver Function Tests: Recent Labs  Lab 08/28/22 1810  AST 21  ALT <5  ALKPHOS 122  BILITOT 1.2  PROT 7.4  ALBUMIN 2.9*   Recent Labs  Lab 08/28/22 1811  LIPASE 23   No results for input(s): "AMMONIA" in the last 168  hours.  ABG    Component Value Date/Time   PHART 7.421 08/22/2022 0853   PCO2ART 41.8 08/22/2022 0853   PO2ART 326 (H) 08/22/2022 0853   HCO3 27.0 08/22/2022 0853   TCO2 28 08/22/2022 0853   ACIDBASEDEF 1.2 05/08/2021 1002   O2SAT 100 08/22/2022 0853     Coagulation Profile: No results for input(s): "INR", "PROTIME" in the last 168 hours.  Cardiac Enzymes: No results for input(s): "CKTOTAL", "CKMB", "CKMBINDEX", "TROPONINI" in the last 168 hours.  HbA1C: Hgb A1c MFr Bld  Date/Time Value Ref Range Status  08/29/2021 12:17 AM 5.0 4.8 - 5.6 % Final    Comment:    (NOTE) Pre diabetes:          5.7%-6.4%  Diabetes:              >6.4%  Glycemic control for   <7.0% adults with diabetes   11/10/2017 07:21 AM 5.4 4.8 - 5.6 % Final    Comment:    (NOTE) Pre diabetes:          5.7%-6.4% Diabetes:              >6.4% Glycemic control for   <7.0% adults with diabetes     CBG: Recent Labs  Lab 08/28/22 1955  GLUCAP 181*    Review of Systems:   Review of Systems  Constitutional:  Negative for chills and fever.  HENT:  Negative for hearing loss.   Eyes:  Negative for blurred vision.  Respiratory:  Negative for cough.   Cardiovascular:  Negative for chest pain.  Gastrointestinal:  Positive for abdominal pain, blood in stool, constipation, diarrhea and nausea. Negative for heartburn.  Genitourinary:  Negative for dysuria.  Musculoskeletal:  Negative for myalgias.  Skin:  Negative for rash.  Neurological:  Negative for dizziness.  Endo/Heme/Allergies:  Does not bruise/bleed easily.  Psychiatric/Behavioral:  Negative for depression.      Past Medical History:  He,  has a past medical history of Acute metabolic encephalopathy ([id number]), Acute on chronic diastolic CHF (congestive heart failure) ([place]) (12/15/2020), Acute respiratory disease due to COVID-19 virus (06/16/2020), Alcohol use disorder (06/30/2022), Anemia, Asthma, Bacteremia due to Gram-positive bacteria  (05/09/2021), Carotid stenosis (09/12/2019), CHF (congestive heart failure) ([place]), Claudication in peripheral vascular disease ([place]) ([id number]), Complication of anesthesia, COPD (chronic obstructive pulmonary disease) ([place]), COVID-19, Critical limb ischemia of right lower extremity ([place]), ESRD on hemodialysis ([place]), GERD (gastroesophageal reflux disease), HLD (hyperlipidemia), Hypertension, Hypertensive urgency (12/24/2016),  Lower GI bleed, Nausea vomiting and diarrhea (08/13/2022), Peripheral vascular disease ([place]), PFO with atrial septal aneurysm, Pneumonia, Stroke [facility]), Surgical wound infection (09/20/2021), [name] (transient ischemic attack) (11/2017), Tobacco abuse, and Volume overload (12/14/2020).   Surgical History:   Past Surgical History:  Procedure Laterality Date   ABDOMINAL AORTOGRAM W/LOWER EXTREMITY N/A 06/21/2020   Procedure: ABDOMINAL AORTOGRAM W/LOWER EXTREMITY;  Surgeon: Angelia Mould, MD;  Location: [place] [facility];  Service: Cardiovascular;  Laterality: N/A;   ABDOMINAL AORTOGRAM W/LOWER EXTREMITY Right 08/29/2021   Procedure: ABDOMINAL AORTOGRAM W/LOWER EXTREMITY;  Surgeon: Cherre Robins, MD;  Location: [place] [facility];  Service: Cardiovascular;  Laterality: Right;   ABDOMINAL AORTOGRAM W/LOWER EXTREMITY N/A 08/10/2022   Procedure: ABDOMINAL AORTOGRAM W/LOWER EXTREMITY;  Surgeon: Angelia Mould, MD;  Location: [place] [facility];  Service: Cardiovascular;  Laterality: N/A;   AORTOGRAM N/A 08/22/2022   Procedure: AORTOGRAM;  Surgeon: Waynetta Sandy, MD;  Location: [place];  Service: Vascular;  Laterality: N/A;   APPLICATION OF WOUND VAC Right 09/21/2021   Procedure: APPLICATION OF WOUND VAC;  Surgeon: Angelia Mould, MD;  Location: [place];  Service: Vascular;  Laterality: Right;   AV FISTULA PLACEMENT Left 03/28/2020   Procedure: ARTERIOVENOUS (AV) FISTULA CREATION LEFT;  Surgeon: [name], MD;  Location: [place];  Service: Vascular;   Laterality: Left;   AV FISTULA PLACEMENT Right 09/23/2020   Procedure: RIGHT Arm ARTERIOVENOUS GRAFT CREATION.;  Surgeon: Angelia Mould, MD;  Location: [place];  Service: Vascular;  Laterality: Right;   COLONOSCOPY     COLONOSCOPY WITH PROPOFOL N/A 06/27/2020   Procedure: COLONOSCOPY WITH PROPOFOL;  Surgeon: Carol Ada, MD;  Location: [place];  Service: Endoscopy;  Laterality: N/A;   DRAINAGE AND CLOSURE OF LYMPHOCELE Right 09/21/2021   Procedure: EVACUATION OF LYMPHOCELE RIGHT GROIN;  Surgeon: Angelia Mould, MD;  Location: [place];  Service: Vascular;  Laterality: Right;   ENDARTERECTOMY FEMORAL Right 09/05/2021   Procedure: ENDARTERECTOMY FEMORAL;  Surgeon: Angelia Mould, MD;  Location: [place];  Service: Vascular;  Laterality: Right;   FEMORAL-POPLITEAL BYPASS GRAFT Right 09/05/2021   Procedure: RIGHT FEMORAL-POPLITEAL ARTERY BYPASS WITH Spaphenous Vein;  Surgeon: Angelia Mould, MD;  Location: [place];  Service: Vascular;  Laterality: Right;   [place]  06/27/2020   Procedure: HEMOSTASIS CLIP PLACEMENT;  Surgeon: Carol Ada, MD;  Location: [place];  Service: Endoscopy;;   INSERTION OF DIALYSIS CATHETER Right 06/24/2020   Procedure: INSERTION OF TUNNELED  DIALYSIS CATHETER; Removal of temporary dialysis catheter right neck;  Surgeon: Angelia Mould, MD;  Location: [place];  Service: Vascular;  Laterality: Right;   INSERTION OF ILIAC STENT Left 08/22/2022   Procedure: INSERTION OF ILIAC STENT;  Surgeon: Waynetta Sandy, MD;  Location: [place];  Service: Vascular;  Laterality: Left;   IR PARACENTESIS  06/30/2022   LIGATION OF ARTERIOVENOUS  FISTULA Left 07/24/2020   Procedure: LIGATION OF LEFT BRACHIOCEPHALIC ARTERIOVENOUS  FISTULA;  Surgeon: [name], MD;  Location: [place];  Service: Vascular;  Laterality: Left;   PERIPHERAL VASCULAR INTERVENTION Right 06/21/2020   Procedure: PERIPHERAL VASCULAR  INTERVENTION;  Surgeon: Angelia Mould, MD;  Location: [place] [facility];  Service: Cardiovascular;  Laterality: Right;   PERIPHERAL VASCULAR INTERVENTION Left 08/10/2022   Procedure: PERIPHERAL VASCULAR INTERVENTION;  Surgeon: Angelia Mould, MD;  Location: [place] [facility];  Service: Cardiovascular;  Laterality: Left;  External  [place]   TEE WITHOUT CARDIOVERSION N/A 05/13/2021   Procedure: TRANSESOPHAGEAL ECHOCARDIOGRAM (TEE);  Surgeon: Donato Heinz, MD;  Location: [facility];  Service: Cardiovascular;  Laterality: N/A;   THROMBECTOMY FEMORAL ARTERY Left 08/22/2022   Procedure: AORTOILIAC THROMBECTOMY, COMMON FEMORAL ENDARTERECTOMY,;  Surgeon: Waynetta Sandy, MD;  Location: [place];  Service: Vascular;  Laterality: Left;   VEIN HARVEST Right 09/05/2021   Procedure: RIGHT Saphenous VEIN HARVEST;  Surgeon: Angelia Mould, MD;  Location: [place];  Service: Vascular;  Laterality: Right;     Social History:   reports that he quit smoking about 3 years ago. His smoking use included cigarettes. He has a 50.00 pack-year smoking history. He has never used smokeless tobacco. He reports that he does not currently use alcohol after a past usage of about 1.0 - 3.0 standard drink of alcohol per week. He reports that he does not use drugs.   Family History:  His family history includes Colon cancer in his father; Hypertension in his mother; Stomach cancer in his brother.   Allergies Allergies  Allergen Reactions   Benadryl [Diphenhydramine] Rash   Lidocaine-Prilocaine Rash    Caused red marks up and down arm     Home Medications  Prior to Admission medications   Medication Sig Start Date End Date Taking? Authorizing Provider  albuterol (VENTOLIN HFA) 108 (90 Base) MCG/ACT inhaler INHALE 1-2 PUFFS INTO THE LUNGS EVERY 6 (SIX) HOURS AS NEEDED FOR WHEEZING OR SHORTNESS OF BREATH. 07/02/20 08/22/23  Maness, Arnette Norris, MD  aspirin EC 81 MG tablet Take 1 tablet (81 mg  total) by mouth daily. Swallow whole. Patient not taking: Reported on 08/22/2022 08/10/22 08/10/23  Angelia Mould, MD  B Complex-C-Zn-Folic Acid (DIALYVITE/ZINC) TABS Take 1 tablet by mouth daily. Patient not taking: Reported on 08/22/2022 04/25/22   [provider]  calcium acetate (PHOSLO) 667 MG capsule Take 2 capsule by mouth three times a day with meals Patient taking differently: Take 1,334 mg by mouth 3 (three) times daily with meals. 06/17/21     clopidogrel (PLAVIX) 75 MG tablet Take 1 tablet (75 mg total) by mouth daily. 08/24/22 09/23/22  Alen Bleacher, MD  hydrOXYzine (ATARAX) 10 MG tablet Take 10 mg by mouth 3 (three) times daily. 08/18/22   [provider]  metoprolol succinate (TOPROL-XL) 25 MG 24 hr tablet Take 1 tablet (25 mg total) by mouth daily. 07/01/22   Precious Gilding, DO  rosuvastatin (CRESTOR) 10 MG tablet Take 1 tablet (10 mg total) by mouth daily. Patient not taking: Reported on 08/13/2022 08/10/22 08/10/23  Angelia Mould, MD     Critical care time: 45 min

## 2022-08-28 NOTE — ED Notes (Addendum)
Placing VAS cath at this time on right side

## 2022-08-28 NOTE — Consult Note (Signed)
Hospital Consult    Reason for Consult:  Cold left leg, left groin pain  Requesting Physician:  Ed MRN #:  [id number]  History of Present Illness: This is a 67 y.o. male well known to the vascular surgery team having recently undergone LLE thromboembolectomy, left iliac stenting for acute on chronic limb ischemia last Saturday.  Patient presents to the ED with a roughly 12-hour history of left lower extremity numbness and paresthesias with motor loss.  Per [place], the weakness was progressive throughout the day.  He has also had trouble moving his right ankle as well, but could move above the knee, and had sensation at the level of the foot.  Along with the left lower extremity sensorimotor deficits, colitis complained of severe abdominal pain in the right lower quadrant extending into the pelvis.  This has been present for quite some time, but he was unable to give an accurate timeline.  Per his daughter, client has been relatively bedridden since returning home.  He did ambulate earlier this morning, but has otherwise been in bed.     Past Medical History:  Diagnosis Date   Acute metabolic encephalopathy [id number]   Acute on chronic diastolic CHF (congestive heart failure) ([place]) 12/15/2020   Acute respiratory disease due to COVID-19 virus 06/16/2020   Alcohol use disorder 06/30/2022   Anemia    Asthma    Bacteremia due to Gram-positive bacteria 05/09/2021   Carotid stenosis 09/12/2019   CHF (congestive heart failure) (HCC)    Claudication in peripheral vascular disease ([place]) [id number]   Complication of anesthesia    " i HAVE A HARD TIME WAKING UP " (only one time)   COPD (chronic obstructive pulmonary disease) ([place])    COVID-19    positive on  06/16/20   Critical limb ischemia of right lower extremity (HCC)    ESRD on hemodialysis ([place])    t, th. sat dialysis   GERD (gastroesophageal reflux disease)    HLD (hyperlipidemia)    Hypertension    Hypertensive urgency 12/24/2016    Lower GI bleed    Nausea vomiting and diarrhea 08/13/2022   Peripheral vascular disease (HCC)    PFO with atrial septal aneurysm    by echo 11/2017   Pneumonia    Stroke [facility])    Surgical wound infection 09/20/2021   [name] (transient ischemic attack) 11/2017   Tobacco abuse    quit 07/17/19   Volume overload 12/14/2020    Past Surgical History:  Procedure Laterality Date   ABDOMINAL AORTOGRAM W/LOWER EXTREMITY N/A 06/21/2020   Procedure: ABDOMINAL AORTOGRAM W/LOWER EXTREMITY;  Surgeon: Angelia Mould, MD;  Location: [place] [facility];  Service: Cardiovascular;  Laterality: N/A;   ABDOMINAL AORTOGRAM W/LOWER EXTREMITY Right 08/29/2021   Procedure: ABDOMINAL AORTOGRAM W/LOWER EXTREMITY;  Surgeon: Cherre , MD;  Location: [place] [facility];  Service: Cardiovascular;  Laterality: Right;   ABDOMINAL AORTOGRAM W/LOWER EXTREMITY N/A 08/10/2022   Procedure: ABDOMINAL AORTOGRAM W/LOWER EXTREMITY;  Surgeon: Angelia Mould, MD;  Location: [place] [facility];  Service: Cardiovascular;  Laterality: N/A;   AORTOGRAM N/A 08/22/2022   Procedure: AORTOGRAM;  Surgeon: Waynetta Sandy, MD;  Location: [place];  Service: Vascular;  Laterality: N/A;   APPLICATION OF WOUND VAC Right 09/21/2021   Procedure: APPLICATION OF WOUND VAC;  Surgeon: Angelia Mould, MD;  Location[facility] OR;  Service: Vascular;  Laterality: Right;   AV FISTULA PLACEMENT Left 03/28/2020   Procedure: ARTERIOVENOUS (AV) FISTULA CREATION LEFT;  Surgeon: [name], MD;  Location: [place];  Service: Vascular;  Laterality: Left;   AV FISTULA PLACEMENT Right 09/23/2020   Procedure: RIGHT Arm ARTERIOVENOUS GRAFT CREATION.;  Surgeon: Angelia Mould, MD;  Location: [place];  Service: Vascular;  Laterality: Right;   COLONOSCOPY     COLONOSCOPY WITH PROPOFOL N/A 06/27/2020   Procedure: COLONOSCOPY WITH PROPOFOL;  Surgeon: Carol Ada, MD;  Location: [place];  Service: Endoscopy;  Laterality: N/A;    DRAINAGE AND CLOSURE OF LYMPHOCELE Right 09/21/2021   Procedure: EVACUATION OF LYMPHOCELE RIGHT GROIN;  Surgeon: Angelia Mould, MD;  Location: [place];  Service: Vascular;  Laterality: Right;   ENDARTERECTOMY FEMORAL Right 09/05/2021   Procedure: ENDARTERECTOMY FEMORAL;  Surgeon: Angelia Mould, MD;  Location: [place];  Service: Vascular;  Laterality: Right;   FEMORAL-POPLITEAL BYPASS GRAFT Right 09/05/2021   Procedure: RIGHT FEMORAL-POPLITEAL ARTERY BYPASS WITH Spaphenous Vein;  Surgeon: Angelia Mould, MD;  Location: [place];  Service: Vascular;  Laterality: Right;   [place]  06/27/2020   Procedure: HEMOSTASIS CLIP PLACEMENT;  Surgeon: Carol Ada, MD;  Location: [place];  Service: Endoscopy;;   INSERTION OF DIALYSIS CATHETER Right 06/24/2020   Procedure: INSERTION OF TUNNELED  DIALYSIS CATHETER; Removal of temporary dialysis catheter right neck;  Surgeon: Angelia Mould, MD;  Location: [place];  Service: Vascular;  Laterality: Right;   INSERTION OF ILIAC STENT Left 08/22/2022   Procedure: INSERTION OF ILIAC STENT;  Surgeon: Waynetta Sandy, MD;  Location: [place];  Service: Vascular;  Laterality: Left;   IR PARACENTESIS  06/30/2022   LIGATION OF ARTERIOVENOUS  FISTULA Left 07/24/2020   Procedure: LIGATION OF LEFT BRACHIOCEPHALIC ARTERIOVENOUS  FISTULA;  Surgeon: [name], MD;  Location: [place];  Service: Vascular;  Laterality: Left;   PERIPHERAL VASCULAR INTERVENTION Right 06/21/2020   Procedure: PERIPHERAL VASCULAR INTERVENTION;  Surgeon: Angelia Mould, MD;  Location: [place] [facility];  Service: Cardiovascular;  Laterality: Right;   PERIPHERAL VASCULAR INTERVENTION Left 08/10/2022   Procedure: PERIPHERAL VASCULAR INTERVENTION;  Surgeon: Angelia Mould, MD;  Location: [place] [facility];  Service: Cardiovascular;  Laterality: Left;  External Illiac   TEE WITHOUT CARDIOVERSION N/A 05/13/2021    Procedure: TRANSESOPHAGEAL ECHOCARDIOGRAM (TEE);  Surgeon: Donato Heinz, MD;  Location: [facility] ENDOSCOPY;  Service: Cardiovascular;  Laterality: N/A;   THROMBECTOMY FEMORAL ARTERY Left 08/22/2022   Procedure: AORTOILIAC THROMBECTOMY, COMMON FEMORAL ENDARTERECTOMY,;  Surgeon: Waynetta Sandy, MD;  Location: [place];  Service: Vascular;  Laterality: Left;   VEIN HARVEST Right 09/05/2021   Procedure: RIGHT Saphenous VEIN HARVEST;  Surgeon: Angelia Mould, MD;  Location: [place];  Service: Vascular;  Laterality: Right;    Allergies  Allergen Reactions   Benadryl [Diphenhydramine] Rash   Lidocaine-Prilocaine Rash    Caused red marks up and down arm    Prior to Admission medications   Medication Sig Start Date End Date Taking? Authorizing Provider  albuterol (VENTOLIN HFA) 108 (90 Base) MCG/ACT inhaler INHALE 1-2 PUFFS INTO THE LUNGS EVERY 6 (SIX) HOURS AS NEEDED FOR WHEEZING OR SHORTNESS OF BREATH. 07/02/20 08/22/23  Maness, Arnette Norris, MD  aspirin EC 81 MG tablet Take 1 tablet (81 mg total) by mouth daily. Swallow whole. Patient not taking: Reported on 08/22/2022 08/10/22 08/10/23  Angelia Mould, MD  B Complex-C-Zn-Folic Acid (DIALYVITE/ZINC) TABS Take 1 tablet by mouth daily. Patient not taking: Reported on 08/22/2022 04/25/22   [provider]  calcium acetate (PHOSLO) 667 MG capsule Take 2 capsule by mouth three times a day with meals Patient taking differently: Take 1,334 mg by mouth 3 (three) times daily with meals. 06/17/21     clopidogrel (PLAVIX) 75 MG tablet Take 1 tablet (75 mg total) by mouth daily. 08/24/22 09/23/22  Alen Bleacher, MD  hydrOXYzine (ATARAX) 10 MG tablet Take 10 mg by mouth 3 (three) times daily. 08/18/22   [provider]  metoprolol succinate (TOPROL-XL) 25 MG 24 hr tablet Take 1 tablet (25 mg total) by mouth daily. 07/01/22   Precious Gilding, DO  rosuvastatin (CRESTOR) 10 MG tablet Take 1 tablet (10 mg total) by mouth daily. Patient not  taking: Reported on 08/13/2022 08/10/22 08/10/23  Angelia Mould, MD    Social History   Socioeconomic History   Marital status: Divorced    Spouse name: Not on file   Number of children: Not on file   Years of education: Not on file   Highest education level: Not on file  Occupational History   Not on file  Tobacco Use   Smoking status: Former    Packs/day: 1.00    Years: 50.00    Additional pack years: 0.00    Total pack years: 50.00    Types: Cigarettes    Quit date: 07/17/2019    Years since quitting: 3.1   Smokeless tobacco: Never  Vaping Use   Vaping Use: Never used  Substance and Sexual Activity   Alcohol use: Not Currently    Alcohol/week: 1.0 - 3.0 standard drink of alcohol    Types: 1 - 3 Cans of beer per week   Drug use: No   Sexual activity: Not Currently  Other Topics Concern   Not on file  Social History Narrative   Married   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   Social Determinants of Health   Financial Resource Strain: [place]  (09/12/2019)   Overall Financial Resource Strain (CARDIA)    Difficulty of Paying Living Expenses: Not hard at all  Food Insecurity: Food Insecurity Present (08/22/2022)   Hunger Vital Sign    Worried About Running Out of Food in the Last Year: Sometimes true    Ran Out of Food in the Last Year: Sometimes true  Transportation Needs: Unmet Transportation Needs (08/22/2022)   PRAPARE - Hydrologist (Medical): Yes    Lack of Transportation (Non-Medical): No  Physical Activity: Not on file  Stress: No Stress Concern Present (09/12/2019)   [place]    Feeling of Stress : Not at all  Social Connections: Not on file  Intimate Partner Violence: Not At Risk (08/22/2022)   Humiliation, Afraid, Rape, and Kick questionnaire    Fear of Current or Ex-Partner: No    Emotionally Abused: No    Physically Abused: No    Sexually Abused: No    Family History  Problem Relation Age of Onset   Hypertension Mother    Colon cancer Father    Stomach cancer Brother     ROS: Otherwise negative unless mentioned in HPI  Physical Examination  Vitals:   08/28/22 1930 08/28/22 1945  BP: (!) 149/108 (!) 114/92  Pulse: (!) 31   Resp: 19 17  Temp:    SpO2:     Body mass index is 24.33 kg/m.  General:  IN distress Gait: Not observed HENT: WNL, normocephalic Pulmonary: normal non-labored breathing  Cardiac: regula Abdomen: soft, pain in the left lower quadrant. No diffuse tenderness Skin: without rashes Vascular Exam/Pulses: venous peroneal signal in the left leg Extremities: with ischemic changes, without Gangrene , without cellulitis; without open wounds;  Mottling in the foot. No motor at the ankle or the knee. No sensory deficits. Was not rigored. No motor in the right foot either Musculoskeletal: no muscle wasting or atrophy  Neurologic: A&O X 3;  No focal weakness or paresthesias are detected; speech is fluent/normal Psychiatric:  The pt has Normal affect. Lymph:  Unremarkable  CBC    Component Value Date/Time   WBC 19.2 (H) 08/28/2022 1810   RBC 2.53 (L) 08/28/2022 1810   HGB 8.7 (L) 08/28/2022 1810   HGB 10.4 (L) 06/18/2021 1431   HCT 27.2 (L) 08/28/2022 1810   HCT 31.6 (L) 06/18/2021 1431   PLT 329 08/28/2022 1810   PLT 142 (L) 06/18/2021 1431   MCV 107.5 (H) 08/28/2022 1810   MCV 96 06/18/2021 1431   MCH 34.4 (H) 08/28/2022 1810   MCHC 32.0 08/28/2022 1810   RDW 15.7 (H) 08/28/2022 1810   RDW 14.3 06/18/2021 1431   LYMPHSABS 1.5 08/28/2022 1810   LYMPHSABS 1.5 06/18/2021 1431   MONOABS 0.7 08/28/2022 1810   EOSABS 0.1 08/28/2022 1810   EOSABS 1.0 (H) 06/18/2021 1431   BASOSABS 0.1 08/28/2022 1810   BASOSABS 0.1 06/18/2021 1431    BMET    Component Value Date/Time   NA 132 (L) 08/28/2022 1810   NA 141 06/18/2021 1431   K 6.9 (HH) 08/28/2022 1810   CL 88 (L) 08/28/2022 1810   CO2 13 (L)  08/28/2022 1810   GLUCOSE 183 (H) 08/28/2022 1810   BUN 134 (H) 08/28/2022 1810   BUN 20 06/18/2021 1431   CREATININE 15.92 (H) 08/28/2022 1810   CREATININE 1.15 02/19/2017 1049   CALCIUM 9.8 08/28/2022 1810   CALCIUM 8.5 (L) 06/10/2020 0900   GFRNONAA 3 (L) 08/28/2022 1810   GFRAA 6 (L) 03/04/2020 1045    COAGS: Lab Results  Component Value Date   INR 1.2 08/12/2022   INR 1.3 (H) 06/29/2022   INR 1.3 (H) 06/28/2022     ASSESSMENT/PLAN: This is a 67 y.o. male in acute distress with multiple medical problems.  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  There is also a large hematoma which is extraperitoneal on the abdomen -no active flow.  Interestingly, runoff is similar to the angiogram that was performed at the time of his last surgery-last Saturday.  There is occlusion of the superficial femoral artery with reconstitution at the above-knee popliteal artery.  With no significant changes in the left lower extremity, I think his limb ischemia is being driven by shock.  He had a low pressure during my visit, and was being administered insulin and dextrose in an effort to find his potassium which was 6.9.  He also had a lactate of 8.5. In evaluating his CT scan, I was also concerned about his superior mesenteric artery which demonstrated soft plaque.  This did not appear to be occlusive, but was concerning to me due to his high lactate.  On physical exam, pain was in the left lower quadrant.  There were no signs of acute mesenteric ischemia.  Also, there were no signs of acute mesenteric ischemia on CT.  I had a long conversation with Kassim and his daughter regarding his overall clinical status.  He is critical.  While I am concerned about the pseudoaneurysm, and the left lower extremity ischemia, his overall presentation is one that could result in mortality.  I have discussed his case with anesthesia,  and nephrology. He is not fit for emergent OR he is not fit for the in his current condition. Finnick would be best served with central line, CRRT, interval repair of left pseudoaneurysm when medically optimized.  I am currently in the operating room with another emergency, and would like for him to follow-up, should his clinical status improved.  I am worried that his lactate is from his prolonged left lower extremity ischemia time, and that the left lower extremity will result in amputation.  After talking to multiple specialist, I went back to Jaleen's room, where he was found to be hypotensive, needing a nonrebreather.  He was immediately taken from the ED room to the trauma bay for resuscitation.  I think Duell's clinical presentation will result in limb loss.  While he is not completely, he has no motor, and no sensory exam in the left leg.  My priorities are correcting his electrolytes and repairin ghte pseudoaneurysm to prevent hemorrhage. I do not think there is an infectious component due to the surgery being less than a week ago with no signs of infection on exam. With a potassium of 7, and no large-bore access, I think any attempt at revascularization would result in arrhythmia.  My plan is for [facility] to follow to the OR should he be optimized. I appreciate all of the care from nursing, ED, inpatient specialists.    Cassandria Santee MD MS [facility] [phone] 08/28/2022  7:57 PM

## 2022-08-28 NOTE — Progress Notes (Signed)
RT NOTE:  Left radial ALINE attempted x 2 without blood flow. Right radial restricted. MD aware.

## 2022-08-28 NOTE — ED Notes (Signed)
RT @ BS placing  A-Line

## 2022-08-28 NOTE — Anesthesia Preprocedure Evaluation (Addendum)
Anesthesia Evaluation    Reviewed: Allergy & Precautions, Patient's Chart, lab work & pertinent test results, Unable to perform ROS - Chart review onlyPreop documentation limited or incomplete due to emergent nature of procedure.  Airway Mallampati: II  TM Distance: >3 FB Neck ROM: Full    Dental  (+) Dental Advisory Given, Poor Dentition, Chipped, Loose, Missing,    Pulmonary asthma , COPD, former smoker   breath sounds clear to auscultation + decreased breath sounds      Cardiovascular hypertension, Pt. on home beta blockers and Pt. on medications pulmonary hypertension+ Peripheral Vascular Disease (Critical limb ischemia of right lower extremity) and +CHF   Rhythm:Regular Rate:Normal  Echo 06/2022  1. Left ventricular ejection fraction, by estimation, is 35 to 40%. The left ventricle has moderately decreased function. The left ventricle demonstrates regional wall motion abnormalities (see scoring diagram/findings for description). There is moderate concentric left ventricular hypertrophy. Left ventricular diastolic parameters are consistent with Grade I diastolic dysfunction (impaired relaxation). Elevated left ventricular end-diastolic pressure.   2. Right ventricular systolic function is normal. The right ventricular size is normal. There is severely elevated pulmonary artery systolic pressure.   3. Left atrial size was severely dilated.   4. Right atrial size was severely dilated.   5. The mitral valve is myxomatous. Mild mitral valve regurgitation. No evidence of mitral stenosis.   6. The aortic valve is normal in structure. There is mild calcification of the aortic valve. There is mild thickening of the aortic valve. Aortic valve regurgitation is not visualized. Aortic valve sclerosis/calcification is present, without any evidence of aortic stenosis. Aortic valve area, by VTI measures 2.74 cm. Aortic valve mean gradient measures 9.0  mmHg. Aortic valve Vmax measures 2.04 m/s.   7. The inferior vena cava is normal in size with <50% respiratory variability, suggesting right atrial pressure of 8 mmHg.     Neuro/Psych TIACVA    GI/Hepatic ,GERD  ,,(+)     substance abuse  alcohol use  Endo/Other  negative endocrine ROS    Renal/GU Renal disease     Musculoskeletal negative musculoskeletal ROS (+)    Abdominal   Peds  Hematology  (+) Blood dyscrasia (Plavix), anemia   Anesthesia Other Findings Day of surgery medications reviewed with the patient.  Reproductive/Obstetrics                             Anesthesia Physical Anesthesia Plan  ASA: 5 and emergent  Anesthesia Plan: General   Post-op Pain Management:    Induction: Intravenous  PONV Risk Score and Plan: 2 and Dexamethasone and Ondansetron  Airway Management Planned: Oral ETT  Additional Equipment: Arterial line and Ultrasound Guidance Line Placement  Intra-op Plan:   Post-operative Plan: Possible Post-op intubation/ventilation  Informed Consent: I have reviewed the patients History and Physical, chart, labs and discussed the procedure including the risks, benefits and alternatives for the proposed anesthesia with the patient or authorized representative who has indicated his/her understanding and acceptance.     Dental advisory given  Plan Discussed with: CRNA  Anesthesia Plan Comments: (Dr. Virl Cagey discussed case with Dr. Trula Slade and they feel, despite his severe medical instability, this pt needs to be done tonight emergently.)       Anesthesia Quick Evaluation

## 2022-08-28 NOTE — ED Notes (Signed)
Placed on 2L o2

## 2022-08-28 NOTE — ED Triage Notes (Addendum)
Patient BIB EMS from home due to abdominal pain. Patient states he's been nauseous and sweaty today. Patient states he had not thrown up until being with EMS. Patient reports not having a BM x 4 days. EMS reports patient radial pulses on left arm are thready due to attempting to have a fistula placed on that arm. Patient right arm is restricted. Patient CBG 232 w/ no hx of diabetes. Patient states can not feel left foot. Patient A&Ox4. Patient has no been dialyzed since last Saturday.

## 2022-08-28 NOTE — Progress Notes (Signed)
Admit: 08/28/2022 LOS: 0  Subjective:  ESRD [facility] [place] last HD 3/17 during admission s/p  thrombectomy/endarterectomy for LLE acute critical limb ischemia Discharged 3/18, has not been to HD all week 2/2 pain Returned to ED earlier today with intense pain in LLE, cold foot K 6.9, HCO3 13 AG 31 and lactate 8.5 LUE AVG w/o bruit or thrill In ED insulin/dextrose, NaHCO3, CaGluc, Albuterol In ED Losing BP, losing consciousness  No intake/output data recorded.  Filed Weights   08/28/22 1632  Weight: 72.6 kg    Scheduled Meds:  sodium chloride flush  3 mL Intravenous Q12H   sodium zirconium cyclosilicate  10 g Oral Once   Continuous Infusions:  albuterol     calcium gluconate 1,000 mg (08/28/22 2012)   prismasol BGK 0/2.5     prismasol BGK 0/2.5     prismasol BGK 0/2.5     PRN Meds:.heparin, sodium chloride  Current Labs: reviewed   Physical Exam:  Blood pressure (!) 69/32, pulse 78, temperature (!) 97.3 F (36.3 C), temperature source Oral, resp. rate (!) 26, height 5\' 8"  (1.727 m), weight 72.6 kg, SpO2 90 %. Groggy Regular Coarse bs b/l L foot cold, mottled RUE AVG no B/T S/nt/nd  A Uremia, Hyperkalemia AGMA 2/2 #1 and lactate Progressive shock Ischemic LLE s/p thrombectomy/endarterctomy 3/16 Clotted / nonfunctional AVG  P D/w EDP and Dr. Virl Cagey VVS, CCM Immediately to ICU to start CRRT, too unstable for OR Temp HD cath to be placed Start all 0K bath, no UF, no heparin.  Q2h K, add back K if/when < 5 Medication Issues; Preferred narcotic agents for pain control are hydromorphone, fentanyl, and methadone. Morphine should not be used.  Baclofen should be avoided Avoid oral sodium phosphate and magnesium citrate based laxatives / bowel preps    Pearson Grippe MD 08/28/2022, 8:21 PM  Recent Labs  Lab 08/23/22 0207 08/24/22 0027 08/28/22 1810  NA 134* 135 132*  K 4.3 4.5 6.9*  CL 96* 96* 88*  CO2 26 26 13*  GLUCOSE 187* 102* 183*  BUN 19 54* 134*   CREATININE 3.88* 6.83* 15.92*  CALCIUM 8.8* 8.6* 9.8  PHOS  --  6.8*  --    Recent Labs  Lab 08/23/22 0207 08/24/22 0027 08/28/22 1810  WBC 33.9* 23.8* 19.2*  NEUTROABS  --   --  16.6*  HGB 10.2* 9.0* 8.7*  HCT 31.6* 28.9* 27.2*  MCV 104.3* 108.2* 107.5*  PLT 227 194 329

## 2022-08-28 NOTE — Procedures (Signed)
Central Venous Catheter Insertion Procedure Note  AIYDEN BARTOSCH  [id number]  1956-06-08  Date:08/28/22  Time:9:40 PM   Provider Performing:Shirle Provencal D Rollene Rotunda   Procedure: Insertion of Non-tunneled Central Venous Catheter([id number])with US guidance [id number])    Indication(s) Hemodialysis  Consent Risks of the procedure as well as the alternatives and risks of each were explained to the patient and/or caregiver.  Consent for the procedure was obtained and is signed in the bedside chart  Anesthesia Topical only with 1% lidocaine   Timeout Verified patient identification, verified procedure, site/side was marked, verified correct patient position, special equipment/implants available, medications/allergies/relevant history reviewed, required imaging and test results available.  Sterile Technique Maximal sterile technique including full sterile barrier drape, hand hygiene, sterile gown, sterile gloves, mask, hair covering, sterile ultrasound probe cover (if used).  Procedure Description Area of catheter insertion was cleaned with chlorhexidine and draped in sterile fashion.   With real-time ultrasound guidance a HD catheter was placed into the right internal jugular vein.  Nonpulsatile blood flow and easy flushing noted in all ports.  The catheter was sutured in place and sterile dressing applied.  Complications/Tolerance None; patient tolerated the procedure well. Chest X-ray is ordered to verify placement for internal jugular or subclavian cannulation.  Chest x-ray is not ordered for femoral cannulation.  EBL Minimal  Specimen(s) None  JD Rexene Agent [facility] 08/28/2022, 9:41 PM  Please see [email or web address] for pager details.  From 7A-7P if no response, please call [phone]. After hours, please call ELink [phone].

## 2022-08-28 NOTE — Progress Notes (Signed)
Pt arrived from the ER with the following personal belongings:  - 1 white socks - 1 pair of underware - 1 pair of blue pants - 1 black cell phone -  1 pair of glasses  Family given patients belongings and instructed to take belongings home.

## 2022-08-28 NOTE — ED Notes (Signed)
JD, PA and  DR Ronalee Belts with CC at [facility]

## 2022-08-28 NOTE — ED Notes (Signed)
Transported to CT 

## 2022-08-28 NOTE — Telephone Encounter (Signed)
Pt's wife, Bethena Roys, called stating that the pt was having complications post-op. She stated that he can't get out of the bed and he has just now broken out into a cold sweat. He is refusing to go via EMS to ED.  Reviewed pt's chart, returned call for clarification, two identifiers used. She had called 911, EMS on the scene, pt's BP 60/40 (reported by wife). She states he is being transported to [facility] ED. Comforted her and reassured her she was making the right decision.

## 2022-08-29 ENCOUNTER — Inpatient Hospital Stay (HOSPITAL_COMMUNITY): Payer: Medicare Other

## 2022-08-29 ENCOUNTER — Inpatient Hospital Stay (HOSPITAL_COMMUNITY): Payer: Medicare Other | Admitting: Anesthesiology

## 2022-08-29 DIAGNOSIS — Z87891 Personal history of nicotine dependence: Secondary | ICD-10-CM

## 2022-08-29 DIAGNOSIS — I724 Aneurysm of artery of lower extremity: Secondary | ICD-10-CM | POA: Diagnosis not present

## 2022-08-29 DIAGNOSIS — I509 Heart failure, unspecified: Secondary | ICD-10-CM | POA: Diagnosis not present

## 2022-08-29 DIAGNOSIS — J449 Chronic obstructive pulmonary disease, unspecified: Secondary | ICD-10-CM | POA: Diagnosis not present

## 2022-08-29 DIAGNOSIS — E872 Acidosis, unspecified: Secondary | ICD-10-CM | POA: Diagnosis not present

## 2022-08-29 DIAGNOSIS — I110 Hypertensive heart disease with heart failure: Secondary | ICD-10-CM

## 2022-08-29 DIAGNOSIS — I97618 Postprocedural hemorrhage and hematoma of a circulatory system organ or structure following other circulatory system procedure: Secondary | ICD-10-CM

## 2022-08-29 LAB — RENAL FUNCTION PANEL
Albumin: 2.2 g/dL — ABNORMAL LOW (ref 3.5–5.0)
Albumin: 2.4 g/dL — ABNORMAL LOW (ref 3.5–5.0)
Anion gap: 21 — ABNORMAL HIGH (ref 5–15)
Anion gap: 27 — ABNORMAL HIGH (ref 5–15)
BUN: 106 mg/dL — ABNORMAL HIGH (ref 8–23)
BUN: 101 mg/dL — ABNORMAL HIGH (ref 8–23)
CO2: 19 mmol/L — ABNORMAL LOW (ref 22–32)
CO2: 20 mmol/L — ABNORMAL LOW (ref 22–32)
Calcium: 8.1 mg/dL — ABNORMAL LOW (ref 8.9–10.3)
Calcium: 8 mg/dL — ABNORMAL LOW (ref 8.9–10.3)
Chloride: 96 mmol/L — ABNORMAL LOW (ref 98–111)
Chloride: 93 mmol/L — ABNORMAL LOW (ref 98–111)
Creatinine, Ser: 12.31 mg/dL — ABNORMAL HIGH (ref 0.61–1.24)
Creatinine, Ser: 11.04 mg/dL — ABNORMAL HIGH (ref 0.61–1.24)
GFR, Estimated: 4 mL/min — ABNORMAL LOW (ref >60)
GFR, Estimated: 5 mL/min — ABNORMAL LOW (ref >60)
Glucose, Bld: 177 mg/dL — ABNORMAL HIGH (ref 70–99)
Glucose, Bld: 127 mg/dL — ABNORMAL HIGH (ref 70–99)
Phosphorus: 10.9 mg/dL — ABNORMAL HIGH (ref 2.5–4.6)
Phosphorus: 9.1 mg/dL — ABNORMAL HIGH (ref 2.5–4.6)
Potassium: 4.8 mmol/L (ref 3.5–5.1)
Potassium: 4.4 mmol/L (ref 3.5–5.1)
Sodium: 136 mmol/L (ref 135–145)
Sodium: 140 mmol/L (ref 135–145)

## 2022-08-29 LAB — GLUCOSE, CAPILLARY
Glucose-Capillary: 157 mg/dL — ABNORMAL HIGH (ref 70–99)
Glucose-Capillary: 148 mg/dL — ABNORMAL HIGH (ref 70–99)
Glucose-Capillary: 165 mg/dL — ABNORMAL HIGH (ref 70–99)
Glucose-Capillary: 147 mg/dL — ABNORMAL HIGH (ref 70–99)
Glucose-Capillary: 149 mg/dL — ABNORMAL HIGH (ref 70–99)
Glucose-Capillary: 185 mg/dL — ABNORMAL HIGH (ref 70–99)
Glucose-Capillary: 147 mg/dL — ABNORMAL HIGH (ref 70–99)

## 2022-08-29 LAB — POCT I-STAT 7, (LYTES, BLD GAS, ICA,H+H)
Acid-base deficit: 14 mmol/L — ABNORMAL HIGH (ref 0.0–2.0)
Acid-base deficit: 8 mmol/L — ABNORMAL HIGH (ref 0.0–2.0)
Bicarbonate: 13.6 mmol/L — ABNORMAL LOW (ref 20.0–28.0)
Bicarbonate: 18.2 mmol/L — ABNORMAL LOW (ref 20.0–28.0)
Calcium, Ion: 1.04 mmol/L — ABNORMAL LOW (ref 1.15–1.40)
Calcium, Ion: 1.12 mmol/L — ABNORMAL LOW (ref 1.15–1.40)
HCT: 21 % — ABNORMAL LOW (ref 39.0–52.0)
HCT: 23 % — ABNORMAL LOW (ref 39.0–52.0)
Hemoglobin: 7.1 g/dL — ABNORMAL LOW (ref 13.0–17.0)
Hemoglobin: 7.8 g/dL — ABNORMAL LOW (ref 13.0–17.0)
O2 Saturation: 100 %
O2 Saturation: 99 %
Patient temperature: 36
Potassium: 5.7 mmol/L — ABNORMAL HIGH (ref 3.5–5.1)
Potassium: 6.1 mmol/L — ABNORMAL HIGH (ref 3.5–5.1)
Sodium: 132 mmol/L — ABNORMAL LOW (ref 135–145)
Sodium: 135 mmol/L (ref 135–145)
TCO2: 15 mmol/L — ABNORMAL LOW (ref 22–32)
TCO2: 19 mmol/L — ABNORMAL LOW (ref 22–32)
pCO2 arterial: 36.3 mmHg (ref 32–48)
pCO2 arterial: 40.3 mmHg (ref 32–48)
pH, Arterial: 7.182 — CL (ref 7.35–7.45)
pH, Arterial: 7.258 — ABNORMAL LOW (ref 7.35–7.45)
pO2, Arterial: 164 mmHg — ABNORMAL HIGH (ref 83–108)
pO2, Arterial: 266 mmHg — ABNORMAL HIGH (ref 83–108)

## 2022-08-29 LAB — POTASSIUM: Potassium: 5.6 mmol/L — ABNORMAL HIGH (ref 3.5–5.1)

## 2022-08-29 LAB — CBC
HCT: 28.8 % — ABNORMAL LOW (ref 39.0–52.0)
Hemoglobin: 9.9 g/dL — ABNORMAL LOW (ref 13.0–17.0)
MCH: 31.5 pg (ref 26.0–34.0)
MCHC: 34.4 g/dL (ref 30.0–36.0)
MCV: 91.7 fL (ref 80.0–100.0)
Platelets: 221 10*3/uL (ref 150–400)
RBC: 3.14 MIL/uL — ABNORMAL LOW (ref 4.22–5.81)
RDW: 18.8 % — ABNORMAL HIGH (ref 11.5–15.5)
WBC: 19.5 10*3/uL — ABNORMAL HIGH (ref 4.0–10.5)
nRBC: 0 % (ref 0.0–0.2)

## 2022-08-29 LAB — POCT I-STAT EG7
Acid-base deficit: 7 mmol/L — ABNORMAL HIGH (ref 0.0–2.0)
Bicarbonate: 19.1 mmol/L — ABNORMAL LOW (ref 20.0–28.0)
Calcium, Ion: 1.05 mmol/L — ABNORMAL LOW (ref 1.15–1.40)
HCT: 30 % — ABNORMAL LOW (ref 39.0–52.0)
Hemoglobin: 10.2 g/dL — ABNORMAL LOW (ref 13.0–17.0)
O2 Saturation: 98 %
Patient temperature: 97.5
Potassium: 5.1 mmol/L (ref 3.5–5.1)
Sodium: 136 mmol/L (ref 135–145)
TCO2: 20 mmol/L — ABNORMAL LOW (ref 22–32)
pCO2, Ven: 39.1 mmHg — ABNORMAL LOW (ref 44–60)
pH, Ven: 7.294 (ref 7.25–7.43)
pO2, Ven: 116 mmHg — ABNORMAL HIGH (ref 32–45)

## 2022-08-29 LAB — LACTIC ACID, PLASMA
Lactic Acid, Venous: 6 mmol/L (ref 0.5–1.9)
Lactic Acid, Venous: 3.8 mmol/L (ref 0.5–1.9)
Lactic Acid, Venous: 1.9 mmol/L (ref 0.5–1.9)
Lactic Acid, Venous: 1.5 mmol/L (ref 0.5–1.9)

## 2022-08-29 LAB — MAGNESIUM: Magnesium: 2.6 mg/dL — ABNORMAL HIGH (ref 1.7–2.4)

## 2022-08-29 LAB — HEPARIN LEVEL (UNFRACTIONATED): Heparin Unfractionated: 0.14 IU/mL — ABNORMAL LOW (ref 0.30–0.70)

## 2022-08-29 LAB — PREPARE RBC (CROSSMATCH)

## 2022-08-29 MED ORDER — ETOMIDATE 2 MG/ML IV SOLN
INTRAVENOUS | Status: DC | PRN
  Administered 2022-08-29: 8 mg via INTRAVENOUS

## 2022-08-29 MED ORDER — SUCCINYLCHOLINE CHLORIDE 200 MG/10ML IV SOSY
PREFILLED_SYRINGE | INTRAVENOUS | Status: DC | PRN
  Administered 2022-08-29: 80 mg via INTRAVENOUS

## 2022-08-29 MED ORDER — SODIUM BICARBONATE 8.4 % IV SOLN: 50.0000 meq | Freq: Once | INTRAVENOUS | Status: AC

## 2022-08-29 MED ORDER — FENTANYL CITRATE (PF) 250 MCG/5ML IJ SOLN
INTRAMUSCULAR | Status: DC | PRN
  Administered 2022-08-29 (×2): 50 ug via INTRAVENOUS

## 2022-08-29 MED ORDER — 0.9 % SODIUM CHLORIDE (POUR BTL) OPTIME
TOPICAL | Status: DC | PRN
  Administered 2022-08-29: 2000 mL

## 2022-08-29 MED ORDER — ORAL CARE MOUTH RINSE: 15.0000 mL | OROMUCOSAL | Status: DC | PRN

## 2022-08-29 MED ORDER — ORAL CARE MOUTH RINSE
15.0000 mL | OROMUCOSAL | Status: DC
  Administered 2022-08-29 – 2022-08-30 (×13): 15 mL via OROMUCOSAL

## 2022-08-29 MED ORDER — VITAL 1.5 CAL PO LIQD
1000.0000 mL | ORAL | Status: DC
  Administered 2022-08-29: 1000 mL

## 2022-08-29 MED ORDER — PRISMASOL BGK 4/2.5 32-4-2.5 MEQ/L REPLACEMENT SOLN
Status: DC
  Filled 2022-08-29 (×4): qty 5000

## 2022-08-29 MED ORDER — HEPARIN SODIUM (PORCINE) 1000 UNIT/ML IJ SOLN
INTRAMUSCULAR | Status: AC
  Filled 2022-08-29: qty 10

## 2022-08-29 MED ORDER — PHENYLEPHRINE HCL-NACL 20-0.9 MG/250ML-% IV SOLN: INTRAVENOUS | Status: DC | PRN

## 2022-08-29 MED ORDER — SODIUM CHLORIDE 0.9 % IV SOLN: INTRAVENOUS | Status: DC | PRN

## 2022-08-29 MED ORDER — PHENYLEPHRINE HCL-NACL 20-0.9 MG/250ML-% IV SOLN
INTRAVENOUS | Status: DC | PRN
  Administered 2022-08-29: 20 ug/min via INTRAVENOUS

## 2022-08-29 MED ORDER — PROPOFOL 500 MG/50ML IV EMUL
INTRAVENOUS | Status: DC | PRN
  Administered 2022-08-29: 25 ug/kg/min via INTRAVENOUS

## 2022-08-29 MED ORDER — SODIUM BICARBONATE 8.4 % IV SOLN
INTRAVENOUS | Status: DC | PRN
  Administered 2022-08-29 (×2): 50 meq via INTRAVENOUS

## 2022-08-29 MED ORDER — HEPARIN SODIUM (PORCINE) 1000 UNIT/ML IJ SOLN
INTRAMUSCULAR | Status: DC | PRN
  Administered 2022-08-29: 5000 [IU] via INTRAVENOUS

## 2022-08-29 MED ORDER — RENA-VITE PO TABS
1.0000 | ORAL_TABLET | Freq: Every day | ORAL | Status: DC
  Administered 2022-08-29: 1
  Filled 2022-08-29: qty 1

## 2022-08-29 MED ORDER — PROSOURCE TF20 ENFIT COMPATIBL EN LIQD
60.0000 mL | Freq: Two times a day (BID) | ENTERAL | Status: DC
  Administered 2022-08-29 (×2): 60 mL
  Filled 2022-08-29 (×2): qty 60

## 2022-08-29 MED ORDER — PHENYLEPHRINE HCL (PRESSORS) 10 MG/ML IV SOLN
INTRAVENOUS | Status: DC | PRN
  Administered 2022-08-29 (×2): 80 ug via INTRAVENOUS

## 2022-08-29 MED ORDER — HEPARIN 6000 UNIT IRRIGATION SOLUTION
Status: DC | PRN
  Administered 2022-08-29: 1

## 2022-08-29 MED ORDER — PANTOPRAZOLE SODIUM 40 MG IV SOLR
40.0000 mg | INTRAVENOUS | Status: DC
  Administered 2022-08-29 – 2022-08-31 (×3): 40 mg via INTRAVENOUS
  Filled 2022-08-29 (×3): qty 10

## 2022-08-29 MED ORDER — PRISMASOL BGK 4/2.5 32-4-2.5 MEQ/L EC SOLN
Status: DC
  Filled 2022-08-29 (×18): qty 5000

## 2022-08-29 MED ORDER — NOREPINEPHRINE 16 MG/250ML-% IV SOLN
0.0000 ug/min | INTRAVENOUS | Status: DC
  Administered 2022-08-29: 12 ug/min via INTRAVENOUS
  Administered 2022-08-29: 14 ug/min via INTRAVENOUS
  Filled 2022-08-29 (×2): qty 250

## 2022-08-29 MED ORDER — ETOMIDATE 2 MG/ML IV SOLN
INTRAVENOUS | Status: AC
  Filled 2022-08-29: qty 10

## 2022-08-29 MED ORDER — PROPOFOL 1000 MG/100ML IV EMUL
5.0000 ug/kg/min | INTRAVENOUS | Status: DC
  Administered 2022-08-29: 40 ug/kg/min via INTRAVENOUS
  Administered 2022-08-29 (×2): 20 ug/kg/min via INTRAVENOUS
  Filled 2022-08-29 (×4): qty 100

## 2022-08-29 MED ORDER — CEFAZOLIN SODIUM-DEXTROSE 2-3 GM-%(50ML) IV SOLR
INTRAVENOUS | Status: DC | PRN
  Administered 2022-08-29: 2 g via INTRAVENOUS

## 2022-08-29 MED ORDER — SODIUM CHLORIDE 0.9% IV SOLUTION: Freq: Once | INTRAVENOUS | Status: DC

## 2022-08-29 MED ORDER — HEPARIN (PORCINE) 25000 UT/250ML-% IV SOLN
2100.0000 [IU]/h | INTRAVENOUS | Status: DC
  Administered 2022-08-29: 800 [IU]/h via INTRAVENOUS
  Administered 2022-08-30: 1100 [IU]/h via INTRAVENOUS
  Administered 2022-08-31: 1550 [IU]/h via INTRAVENOUS
  Administered 2022-08-31: 1450 [IU]/h via INTRAVENOUS
  Administered 2022-09-01: 1850 [IU]/h via INTRAVENOUS
  Administered 2022-09-02: 2000 [IU]/h via INTRAVENOUS
  Filled 2022-08-29 (×6): qty 250

## 2022-08-29 MED ORDER — MIDAZOLAM HCL 5 MG/5ML IJ SOLN
INTRAMUSCULAR | Status: DC | PRN
  Administered 2022-08-29: .5 mg via INTRAVENOUS

## 2022-08-29 MED ORDER — SODIUM BICARBONATE 8.4 % IV SOLN
INTRAVENOUS | Status: AC
  Administered 2022-08-29: 50 meq via INTRAVENOUS
  Filled 2022-08-29: qty 50

## 2022-08-29 MED ORDER — VITAL HIGH PROTEIN PO LIQD: 1000.0000 mL | ORAL | Status: DC

## 2022-08-29 NOTE — Progress Notes (Signed)
Unable to doppler pulse in pt's L foot. Dr. Unice Bailey and Dr. Governor Rooks aware.

## 2022-08-29 NOTE — Progress Notes (Signed)
ANTICOAGULATION CONSULT NOTE  Pharmacy Consult for Heparin Indication:  lower extremity limb ischemia  Allergies  Allergen Reactions   Benadryl [Diphenhydramine] Rash   Lidocaine-Prilocaine Rash    Caused red marks up and down arm    Patient Measurements: Height: 5\' 8"  (172.7 cm) Weight: 67.2 kg (148 lb 2.4 oz) IBW/kg (Calculated) : 68.4 Heparin Dosing Weight: 64.4 kg  Vital Signs: Temp: 98.7 F (37.1 C) (03/23 1530) Temp Source: Oral (03/23 1530) BP: 130/52 (03/23 1218) Pulse Rate: 75 (03/23 1500)  Labs: Recent Labs    08/28/22 1810 08/28/22 2224 08/29/22 0110 08/29/22 0210 08/29/22 0431 08/29/22 0500 08/29/22 1434  HGB 8.7*   < > 7.8* 9.9* 10.2*  --   --   HCT 27.2*   < > 23.0* 28.8* 30.0*  --   --   PLT 329  --   --  221  --   --   --   HEPARINUNFRC  --   --   --   --   --   --  0.14*  CREATININE 15.92*  --   --   --   --  12.31*  --    < > = values in this interval not displayed.     Assessment:  67 yr old male with hx LLE thromboembolectomy, left iliac stenting for acute on chronic limb ischemia on 08/22/22 presented to ED on 3/22 with LLE pain and swelling. Left groin pseudoaneurysm and large extraperitoneal hematoma per imaging.  s/p left groin exploration, evacuation of hematoma and repair of distal external iliac artery 3/22.  IV heparin started post-op.  Heparin level subtherapeutic (0.14) on infusion at 800 units/hr. No issues with line or bleeding reported per RN.  Goal of Therapy:  Heparin level 0.3-0.5 units/ml  Monitor platelets by anticoagulation protocol: Yes   Plan:  Increase heparin infusion to 950 units/hr. Will f/u heparin level 8 hr post rate change  Sherlon Handing, PharmD, BCPS Please see amion for complete clinical pharmacist phone list 08/29/2022,3:32 PM

## 2022-08-29 NOTE — Progress Notes (Signed)
eLink Physician-Brief Progress Note Patient Name: DREYDEN MUMAW DOB: 03-28-1956 MRN: [id number]   Date of Service  08/29/2022  HPI/Events of Note  67 yo man with ESRD s/p L iliofemoral endarterectomy and thrombectomy, d/c 3/17, developed abdominal pain represented to the hospital today.  Patient hypotensive and acidemic with elevated potassium. CT angio showed L fem pseudoaneurysm, large extraperitoneal hematoma on the abdomen, and unchanged perfusion.  Admitted to ICU.  Seen by nephrology. Returned to OR for repair.  Now back from OR intubated.  eICU Interventions  Chart reviewed     Intervention Category Evaluation Type: New Patient Evaluation  Mauri Brooklyn, P 08/29/2022, 2:13 AM

## 2022-08-29 NOTE — Progress Notes (Signed)
eLink Physician-Brief Progress Note Patient Name: Albert Harris DOB: Feb 16, 1956 MRN: [id number]   Date of Service  08/29/2022  HPI/Events of Note  Labile bp.  I suspect remains acidemic.  Lactic acid improved but remains elevated at 6.0.  eICU Interventions  Abg stat 1 amp sodium bicarb On levophed drip      Intervention Category Major Interventions: Hypotension - evaluation and management  Mauri Brooklyn, P 08/29/2022, 4:25 AM

## 2022-08-29 NOTE — Anesthesia Procedure Notes (Addendum)
Arterial Line Insertion Start/End3/22/2023 11:41 PM, 08/29/2022 1:22 AM Performed by: Nolon Nations, MD, anesthesiologist  Preanesthetic checklist: patient identified, IV checked, risks and benefits discussed, surgical consent and monitors and equipment checked Lidocaine 1% used for infiltration Left, brachial was placed Catheter size: 18 G Hand hygiene performed , maximum sterile barriers used  and Seldinger technique used  Attempts: 3 Procedure performed using ultrasound guided technique. Ultrasound Notes:anatomy identified, needle tip was noted to be adjacent to the nerve/plexus identified, no ultrasound evidence of intravascular and/or intraneural injection and image(s) printed for medical record Following insertion, line sutured, dressing applied and Biopatch. Post procedure assessment: normal  Post procedure complications: second provider assisted and unsuccessful attempts. Patient tolerated the procedure with difficulty. Additional procedure comments: Left radial attempt x1 -PaxtonCRNA.

## 2022-08-29 NOTE — Progress Notes (Signed)
New ST elevation noted on arrival back to unit from OR : obtained EKG to confirm . Inform Dr. Tamala Julian and Dr Virl Cagey.

## 2022-08-29 NOTE — Op Note (Signed)
    NAME: Albert Harris    MRN: [id number] DOB: [birth year]    DATE OF OPERATION: 08/29/2022  PREOP DIAGNOSIS:    Left common femoral artery pseudoaneurysm  POSTOP DIAGNOSIS:    Same  PROCEDURE:    Left groin exploration Hematoma evacuation Primary repair of the distal external iliac artery Myriad morcell placement -1500mg  Prevena vacuum dressing    SURGEON: Broadus John  ASSIST: Paulo Fruit, PA ANESTHESIA: General   EBL: 130ml  INDICATIONS:    Albert Harris is a 67 y.o. male with longstanding peripheral arterial disease, well-known to the vascular surgery service after recently undergoing left iliofemoral endarterectomy, retrograde stenting, bovine pericardial patch plasty.  Postoperatively, there was some bleeding in the groin requiring manual pressure.  This ceased and he was discharged postoperative day 2 without issue.  Plan we presented today with left lower extremity pain, electrolyte abnormalities, swelling in the left groin.  Imaging demonstrated no changes in lower extremity perfusion, but did note a pseudoaneurysm in the left groin, large extraperitoneal hematoma on the abdomen.  Electrolyte abnormalities included hyperkalemia, rising lactate.  Hyperkalemia was medically managed with the use of insulin and dextrose.  I have discussed the risks and benefits of left exploration to manage the pseudoaneurysm and prevent life-threatening bleeding, Albert Harris elected to proceed.  FINDINGS:   Dehiscence at the proximal aspect of the bovine pericardial patch at the suture line.  TECHNIQUE:   Patient brought to the OR laid in supine position.  General anesthesia induced and the patient was prepped and draped in standard fashion.  The case began with opening the previous vertical incision in the left groin.  Immediately, hematoma was encountered.  This was evacuated, and layers of Vicryl suture were opened.  Upon entering the deepest layer, there was frank, pulsatile  bleeding.  This was initially controlled with manual pressure, however after exploring the wound bed, I was able to place vascular clamps both proximally and distally to the bleed.  The patient was heparinized with 5000 units IV heparin.  Upon further assessment, the proximal aspect of the bovine pericardial patch had ripped at the suture line.  This appeared to be a technical issue.  I had no concerns for infection. Fortunately, I was able to repair the hood of the graft using 3, interrupted 5.0 Prolene sutures.  Upon completion, the artery was backbled to ensure that there was no thrombus.  Upon time the remaining sutures down, there was a palpable pulse in the left common femoral artery.  During this portion of the case, Albert Harris's blood pressure was soft, and he required both product and pressor.  I elected to irrigate the wound bed with copious amounts of saline followed by myriad morcell powder placement, with subsequent closure using running 2-0 Vicryl suture in layers.  The skin was closed using staples with a prevena VAC at the level of the skin  Due to the patient's critical condition, we discussed leave the patient intubated. I had no plans for more distal revascularization as Albert Harris was critical and needed resuscitation.   Impression: Successful primary repair of left distal external iliac artery pseudoaneurysm.  Albert Burows, MD [facility] DATE OF DICTATION:   08/29/2022

## 2022-08-29 NOTE — Progress Notes (Signed)
  Daily Progress Note  S/p:Left femoral exploration, pseudoaneurysm repair.   Subjective: Intubated, sedated.   Objective: Vitals:   08/29/22 0900 08/29/22 0915  BP:    Pulse: 81 82  Resp: 16 16  Temp:    SpO2: 100% 100%    Physical Examination Abdomen less distended.  Weak venous signal in the left calf. Mottling significantly improved.  VAC to suction Regular rate   ASSESSMENT/PLAN:  1 Day Post-Op Left femoral exploration, pseudoaneurysm repair  Overall, clot looks better than he did last night.  The left leg appears more viable than previous with significantly improved mottling. Lactate is trending down, the VAC is to suction Currently on CRRT Concerned about the levo dosage, and slow climb.  Will continue to monitor.   Patient may benefit from EKG to ensure no cardiac etiology Less concerned about ischemia (leg or mesenteric) in the setting of decreasing lactate. Will discuss with the ICU.   Cassandria Santee MD MS [facility] [phone] 08/29/2022  9:54 AM

## 2022-08-29 NOTE — Anesthesia Procedure Notes (Deleted)
Arterial Line Insertion Start/End3/22/2024 11:40 PM, 08/29/2022 12:00 AM Performed by: Nolon Nations, MD, anesthesiologist  Patient location: Pre-op. Preanesthetic checklist: patient identified, IV checked, site marked, risks and benefits discussed, surgical consent, monitors and equipment checked, pre-op evaluation, timeout performed and anesthesia consent Lidocaine 1% used for infiltration Left, brachial was placed Catheter size: 18 G Hand hygiene performed , maximum sterile barriers used  and Seldinger technique used  Attempts: 3 Procedure performed using ultrasound guided technique. Ultrasound Notes:anatomy identified, needle tip was noted to be adjacent to the nerve/plexus identified, no ultrasound evidence of intravascular and/or intraneural injection and image(s) printed for medical record Following insertion, dressing applied, Biopatch and line sutured. Post procedure assessment: normal and unchanged  Post procedure complications: unsuccessful attempts and second provider assisted. Patient tolerated the procedure with difficulty.

## 2022-08-29 NOTE — Procedures (Signed)
I saw and evaluated the patient on CRRT.  I reviewed the last 24 hours events.  Adjustments to CRRT prescription are made as needed. Switch to 4K, maintain even   [facility] Weights   08/28/22 1632 08/28/22 2227 08/29/22 0452  Weight: 72.6 kg 64.4 kg 67.2 kg    Recent Labs  Lab 08/29/22 0500  NA 136  K 4.8  CL 96*  CO2 19*  GLUCOSE 177*  BUN 106*  CREATININE 12.31*  CALCIUM 8.1*  PHOS 10.9*    Recent Labs  Lab 08/24/22 0027 08/28/22 1810 08/28/22 2224 08/29/22 0110 08/29/22 0210 08/29/22 0431  WBC 23.8* 19.2*  --   --  19.5*  --   NEUTROABS  --  16.6*  --   --   --   --   HGB 9.0* 8.7*   < > 7.8* 9.9* 10.2*  HCT 28.9* 27.2*   < > 23.0* 28.8* 30.0*  MCV 108.2* 107.5*  --   --  91.7  --   PLT 194 329  --   --  221  --    < > = values in this interval not displayed.    Scheduled Meds:  Chlorhexidine Gluconate Cloth  6 each Topical Daily   feeding supplement (VITAL HIGH PROTEIN)  1,000 mL Per Tube Q24H   mouth rinse  15 mL Mouth Rinse Q2H   pantoprazole (PROTONIX) IV  40 mg Intravenous Q24H   sodium chloride flush  10-40 mL Intracatheter Q12H   sodium zirconium cyclosilicate  10 g Oral Once   Continuous Infusions:   prismasol BGK 4/2.5      prismasol BGK 4/2.5     sodium chloride     sodium chloride     heparin 800 Units/hr (08/29/22 0800)   norepinephrine (LEVOPHED) Adult infusion 23 mcg/min (08/29/22 0800)   prismasol BGK 4/2.5     propofol (DIPRIVAN) infusion 30 mcg/kg/min (08/29/22 0800)   PRN Meds:.Place/Maintain arterial line **AND** sodium chloride, docusate sodium, heparin, ondansetron (ZOFRAN) IV, mouth rinse, polyethylene glycol, sodium chloride, sodium chloride flush   Santiago Bumpers,  MD 08/29/2022, 9:01 AM

## 2022-08-29 NOTE — Anesthesia Postprocedure Evaluation (Addendum)
Anesthesia Post Note  Patient: Albert Harris  Procedure(s) Performed: Virl Son EXPLORATION (Left) APPLICATION OF PREVENA WOUND VAC WITH PLACEMENT OF MYRIAD MORCELLS (Left: Groin) LEFT EXTERNAL ILIAC ARTERY REPAIR (Left: Groin)     Patient location during evaluation: ICU Anesthesia Type: General Level of consciousness: sedated and patient remains intubated per anesthesia plan Pain management: pain level controlled Vital Signs Assessment: post-procedure vital signs reviewed and stable Respiratory status: patient remains intubated per anesthesia plan and patient on ventilator - see flowsheet for VS Cardiovascular status: stable Anesthetic complications: no   No notable events documented.  Last Vitals:  Vitals:   08/29/22 1910 08/29/22 2000  BP:    Pulse:    Resp:    Temp: 36.8 C   SpO2:  100%    Last Pain:  Vitals:   08/29/22 1910  TempSrc: Axillary  PainSc:                  Nolon Nations

## 2022-08-29 NOTE — Progress Notes (Addendum)
Patient seen and evaluated status post left groin exploration, pseudoaneurysm repair.  Patient remains intubated.  Venous peroneal signal in the left foot.  Foot appears less mottled than previous likely due to resuscitation efforts. CRRT starting.  Albert Harris remains critical.  In the setting of end-stage renal disease, and being intubated, I am worried about being unable to follow abdominal exams to ensure he does not have any form of mesenteric ischemia. There were no signs on the CT scan which is encouraging, however the abdomen does appear slightly more distended than previous. The SMA demonstrated significant disease however there was flow beyond the large proximal lesion. He is sitting up versus lying down which can also skew the exam.   Levo is being weaned.  Lactate will continue to rise as there is no where for it to washout. Should his abdomen increase further, or he start to have changes in his vital signs, I discussed repeat CT angio abdomen pelvis with Dr. Patsey Berthold.   Will continue to monitor closely.   Albert John MD

## 2022-08-29 NOTE — Anesthesia Procedure Notes (Signed)
Procedure Name: Intubation Date/Time: 08/29/2022 12:19 AM  Performed by: Josephine Igo, CRNAPre-anesthesia Checklist: Patient identified, Emergency Drugs available, Patient being monitored and Suction available Oxygen Delivery Method: Circle system utilized Preoxygenation: Pre-oxygenation with 100% oxygen Induction Type: Cricoid Pressure applied, Rapid sequence and IV induction Laryngoscope Size: Miller and 2 Grade View: Grade I Tube type: Oral Tube size: 7.5 mm Airway Equipment and Method: Stylet

## 2022-08-29 NOTE — Progress Notes (Signed)
Initial Nutrition Assessment  DOCUMENTATION CODES:   Not applicable  INTERVENTION:  Once placement confirmed, initiate tube feeding via OG: Vital 1.5 at 20 ml/h and advance 68ml q12h to a goal rate of 60ml/hr (1344ml per day) 61ml Prosource TF20 BID Provides 2140 kcal, 130 gm protein, 1008 ml free water daily  Renal MVI with minerals daily via tube  NUTRITION DIAGNOSIS:   Inadequate oral intake related to acute illness as evidenced by NPO status.  GOAL:   Patient will meet greater than or equal to 90% of their needs  MONITOR:   Labs, Weight trends, TF tolerance, I & O's  REASON FOR ASSESSMENT:   Consult, Ventilator Enteral/tube feeding initiation and management (trickle tube feeds)  ASSESSMENT:   Pt admitted with abdominal pain, nausea, diaphoresis and unable to feel L foot d/t L femoral pseudoaneurysm. Pt was hypotensive with lactic acidosis upon admission. PMH significant for ESRD, PAD, COPD, HLD, HTN, HFrEF 35-40%.  3/22 - admit, emergent CRRT 3/23 - L fem artery repair  Noted to have multiple recent admissions, however has not been assessed by RD team since 2022.  Vascular less concerned now about ischemia (leg or mesenteric) d/t setting of decreasing lactate.   Discussed TF advancement with MD. Madaline Brilliant to advance as tolerated. Reached out to RN regarding further OG tube advancement.   Patient is currently intubated on ventilator support MV: 8.6 L/min Temp (24hrs), Avg:98 F (36.7 C), Min:97.3 F (36.3 C), Max:99.2 F (37.3 C) MAP 71  Propofol: 11.52 ml/hr (providing 304 kcal/d; kcal not included in TF totals)  Reviewed Nephrology note from prior admission.  EDW 64.5 kg Current wt 67.2 kg Weight history within the last year is widely variable.   Edema: non-pitting BLE  Medications: IV protonix IV drips: heparin, levo @21mcg /min  Labs: BUN 106, Cr 12.31, anion gap 21, ionized CA 1.05, phos 10.9, Mg 2.6, GFR 4, lactic acid 1.9 (WDL), CBG's 148-165 x24  hours  CRRT fluid removed: 248ml (1900-0700) + 180ml (0700-1100)   NUTRITION - FOCUSED PHYSICAL EXAM: RD working remotely. Deferred to follow up.  Diet Order:   Diet Order             Diet NPO time specified  Diet effective now                   EDUCATION NEEDS:   No education needs have been identified at this time  Skin:  Skin Assessment: Skin Integrity Issues: Skin Integrity Issues:: Stage II, Incisions Stage II: coccyx Incisions: L groin (closed)  Last BM:  3/22  Height:   Ht Readings from Last 1 Encounters:  08/29/22 5\' 8"  (1.727 m)    Weight:   Wt Readings from Last 1 Encounters:  08/29/22 67.2 kg   BMI:  Body mass index is 22.53 kg/m.  Estimated Nutritional Needs:   Kcal:  1900-2100  Protein:  130g  Fluid:  1L + UOP  Clayborne Dana, RDN, LDN Clinical Nutrition

## 2022-08-29 NOTE — Transfer of Care (Signed)
Immediate Anesthesia Transfer of Care Note  Patient: Albert Harris  Procedure(s) Performed: Virl Son EXPLORATION (Left) APPLICATION OF PREVENA WOUND VAC WITH PLACEMENT OF MYRIAD MORCELLS (Left: Groin) LEFT EXTERNAL ILIAC ARTERY REPAIR (Left: Groin)  Patient Location: ICU  Anesthesia Type:General  Level of Consciousness: sedated and Patient remains intubated per anesthesia plan  Airway & Oxygen Therapy: Patient remains intubated per anesthesia plan  Post-op Assessment: Report given to RN and Post -op Vital signs reviewed and stable  Post vital signs: Reviewed and stable  Last Vitals:  Vitals Value Taken Time  BP    Temp 37 C 08/29/22 0158  Pulse 78 08/29/22 0159  Resp 17 08/29/22 0159  SpO2 100 % 08/29/22 0159  Vitals shown include unvalidated device data.  Last Pain:  Vitals:   08/29/22 0158  TempSrc: Axillary  PainSc:       Patients Stated Pain Goal: 2 ([id number])  Complications: No notable events documented.

## 2022-08-29 NOTE — Progress Notes (Signed)
ANTICOAGULATION CONSULT NOTE - Initial Consult  Pharmacy Consult for Heparin Indication:  lower extremity limb ischemia  Allergies  Allergen Reactions   Benadryl [Diphenhydramine] Rash   Lidocaine-Prilocaine Rash    Caused red marks up and down arm    Patient Measurements: Height: 5\' 8"  (172.7 cm) Weight: 64.4 kg (141 lb 15.6 oz) IBW/kg (Calculated) : 68.4 Heparin Dosing Weight: 64.4 kg  Vital Signs: Temp: 98.6 F (37 C) (03/23 0158) Temp Source: Axillary (03/23 0158) BP: 69/49 (03/22 2245) Pulse Rate: 78 (03/22 2205)  Labs: Recent Labs    08/28/22 1810 08/28/22 2224 08/29/22 0028 08/29/22 0110  HGB 8.7* 7.5* 7.1* 7.8*  HCT 27.2* 22.0* 21.0* 23.0*  PLT 329  --   --   --   CREATININE 15.92*  --   --   --    ESRD   Medical History: Past Medical History:  Diagnosis Date   Acute metabolic encephalopathy [id number]   Acute on chronic diastolic CHF (congestive heart failure) ([place]) 12/15/2020   Acute respiratory disease due to COVID-19 virus 06/16/2020   Alcohol use disorder 06/30/2022   Anemia    Asthma    Bacteremia due to Gram-positive bacteria 05/09/2021   Carotid stenosis 09/12/2019   CHF (congestive heart failure) ([facility])    Claudication in peripheral vascular disease ([place]) [id number]   Complication of anesthesia    " i HAVE A HARD TIME WAKING UP " (only one time)   COPD (chronic obstructive pulmonary disease) ([place])    COVID-19    positive on  06/16/20   Critical limb ischemia of right lower extremity (H[facility])    ESRD on hemodialysis ([place])    t, th. sat dialysis   GERD (gastroesophageal reflux disease)    HLD (hyperlipidemia)    Hypertension    Hypertensive urgency 12/24/2016   Lower GI bleed    Nausea vomiting and diarrhea 08/13/2022   Peripheral vascular disease (H[facility])    PFO with atrial septal aneurysm    by echo 11/2017   Pneumonia    Stroke [facility])    Surgical wound infection 09/20/2021   [name] (transient ischemic attack) 11/2017   Tobacco abuse    quit  07/17/19   Volume overload 12/14/2020   Assessment:  67 yr old male with hx LLE thromboembolectomy, left iliac stenting for acute on chronic limb ischemia on 08/22/22 presented to ED on 3/22 with LLE pain and swelling. Left groin pseudoaneurysm and large extraperitoneal hematoma per imaging.  Now s/p left groin exploration, evacuation of hematoma and repair of distal external iliac artery.  To begin IV heparin at 6am, ~4 hours post-op.    Heparin 5000 units IV given in OR; Hgb 7.8 after 3 units PRBCs transfused in OR.  RN reports no current bleeding.  Starting CRRT.  Will begin with conservative heparin dosing without bolus and target low therapeutic levels.  Goal of Therapy:  Heparin level 0.3-0.5 units/ml  Monitor platelets by anticoagulation protocol: Yes   Plan:  Begin heparin drip at 6am at 800 units/hr. Heparin level ~8 hours after drip begins. Daily heparin level and CBC. Monitor for signs/symptoms of bleeding.  Arty Baumgartner, RPh 08/29/2022,2:45 AM

## 2022-08-29 NOTE — Progress Notes (Signed)
Nephrology Follow-Up Consult note   Assessment/Recommendations: Albert Harris is a/an 67 y.o. male with a past medical history significant for ESRD, PAD, CHF, HTN, admitted for critical limb ischemia.       ESRD: TTS and [place] via RUE AVG. Using CRRT for dialysis at this time given the patient's hemodynamic instability.  Changing CRRT bath to 4K given improvements in hyperkalemia  Left common femoral artery pseudoaneurysm: Status post surgery with Dr. Unk Lightning on the morning of 08/29/2022 status post repair.  Continue following  Anion gap metabolic acidosis: Secondary to lactic acidosis and likely limb ischemia and sepsis.  Improving  Shock: Likely multifactorial.  Continue with norepinephrine.  Requirements fluctuating  Hyperkalemia: On serial K bath.  Will switch to 4K.  Continue following labs  Anemia: Likely multifactorial.  Transfusions as needed  Metabolic encephalopathy: Likely multifactorial.  Did miss dialysis for a week.  Intubated per primary team   Recommendations conveyed to primary service.    [facility] 08/29/2022 8:56 AM  ___________________________________________________________  CC: Femoral artery pseudoaneurysm  Interval History/Subjective: Patient underwent emergency surgery for left lower extremity last night.  Remains critically ill on norepinephrine with fluctuating levels.  Labs overall improved and CRRT running without significant issues at this time.   Medications:  Current Facility-Administered Medications  Medication Dose Route Frequency Provider Last Rate Last Admin    prismasol BGK 4/2.5 infusion   CRRT Continuous Reesa Chew, MD        prismasol BGK 4/2.5 infusion   CRRT Continuous Reesa Chew, MD       0.9 %  sodium chloride infusion   Intra-arterial PRN Baglia, Corrina, PA-C       0.9 %  sodium chloride infusion  250 mL Intravenous Continuous Baglia, Corrina, PA-C       Chlorhexidine Gluconate  Cloth 2 % PADS 6 each  6 each Topical Daily Baglia, Corrina, PA-C   6 each at 08/28/22 2224   docusate sodium (COLACE) capsule 100 mg  100 mg Oral BID PRN Baglia, Corrina, PA-C       feeding supplement (VITAL HIGH PROTEIN) liquid 1,000 mL  1,000 mL Per Tube Q24H Chesley Mires, MD       heparin ADULT infusion 100 units/mL (25000 units/213mL)  800 Units/hr Intravenous Continuous Collier Bullock, MD 8 mL/hr at 08/29/22 0800 800 Units/hr at 08/29/22 0800   heparin injection 1,000-6,000 Units  1,000-6,000 Units CRRT PRN Baglia, Corrina, PA-C       norepinephrine (LEVOPHED) 16 mg in 251mL (0.064 mg/mL) premix infusion  0-40 mcg/min Intravenous Titrated Mauri Brooklyn, MD 21.6 mL/hr at 08/29/22 0800 23 mcg/min at 08/29/22 0800   ondansetron (ZOFRAN) injection 4 mg  4 mg Intravenous Q6H PRN Baglia, Corrina, PA-C       Oral care mouth rinse  15 mL Mouth Rinse Q2H Chesley Mires, MD       Oral care mouth rinse  15 mL Mouth Rinse PRN Chesley Mires, MD       pantoprazole (PROTONIX) injection 40 mg  40 mg Intravenous Q24H Sood, Vineet, MD       polyethylene glycol (MIRALAX / GLYCOLAX) packet 17 g  17 g Oral Daily PRN Baglia, Corrina, PA-C       prismasol BGK 4/2.5 infusion   CRRT Continuous Reesa Chew, MD       propofol (DIPRIVAN) 1000 MG/100ML infusion  5-80 mcg/kg/min Intravenous Titrated Mauri Brooklyn, MD 11.52 mL/hr at 08/29/22 0800 30 mcg/kg/min at 08/29/22 0800  sodium chloride 0.9 % primer fluid for CRRT   CRRT PRN Karoline Caldwell, PA-C   Given at 08/29/22 0335   sodium chloride flush (NS) 0.9 % injection 10-40 mL  10-40 mL Intracatheter Q12H Baglia, Corrina, PA-C       sodium chloride flush (NS) 0.9 % injection 10-40 mL  10-40 mL Intracatheter PRN Baglia, Corrina, PA-C       sodium zirconium cyclosilicate (LOKELMA) packet 10 g  10 g Oral Once Baglia, Corrina, PA-C          Review of Systems: Unable to obtain due to the patient's sedation  Physical Exam: Vitals:   08/29/22 0830 08/29/22 0843   BP:  (!) 109/47  Pulse: 77 78  Resp: 16 16  Temp:    SpO2: 100% 100%   Total I/O In: 114.4 [I.V.:114.4] Out: -   Intake/Output Summary (Last 24 hours) at 08/29/2022 0856 Last data filed at 08/29/2022 0800 Gross per 24 hour  Intake 3395.64 ml  Output 351.1 ml  Net 3044.54 ml   Constitutional: Critically ill-appearing, lying in bed, sedated ENMT: ears and nose without scars or lesions, MMM CV: normal rate, no edema Respiratory: Coarse bilateral breath sounds, bilateral chest rise, intubated, ventilated Gastrointestinal: soft, nonended Skin: no visible lesions or rashes Psych: Sedated, not interactive   Test Results I personally reviewed new and old clinical labs and radiology tests Lab Results  Component Value Date   NA 136 08/29/2022   K 4.8 08/29/2022   CL 96 (L) 08/29/2022   CO2 19 (L) 08/29/2022   BUN 106 (H) 08/29/2022   CREATININE 12.31 (H) 08/29/2022   GFR 58.30 (L) 01/19/2017   CALCIUM 8.1 (L) 08/29/2022   ALBUMIN 2.2 (L) 08/29/2022   PHOS 10.9 (H) 08/29/2022    CBC Recent Labs  Lab 08/24/22 0027 08/28/22 1810 08/28/22 2224 08/29/22 0110 08/29/22 0210 08/29/22 0431  WBC 23.8* 19.2*  --   --  19.5*  --   NEUTROABS  --  16.6*  --   --   --   --   HGB 9.0* 8.7*   < > 7.8* 9.9* 10.2*  HCT 28.9* 27.2*   < > 23.0* 28.8* 30.0*  MCV 108.2* 107.5*  --   --  91.7  --   PLT 194 329  --   --  221  --    < > = values in this interval not displayed.

## 2022-08-29 NOTE — H&P (Signed)
NAME:  Albert Harris, MRN:  [id number], DOB:  1955/10/08, LOS: 1 ADMISSION DATE:  08/28/2022, CONSULTATION DATE:  08/28/22 REFERRING MD: Jeannine Kitten , CHIEF COMPLAINT:  Abdominal pain  History of Present Illness:  67 yo male brought to ER with abdominal pain, nausea, diaphoresis and unable to feel his Lt foot.  Hypotensive with lactic acidosis in ER.  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  Hx of ESRD and had hyperkalemia.  Had emergent CRRT.  Vascular consulted and taken to OR.    Pertinent  Medical History  ESRD -AV fistula (no flow tonight), PAD, COPD, HLD, HTN, HFrEF 35-40%, PAD  Significant Hospital Events: Including procedures, antibiotic start and stop dates in addition to other pertinent events   3/22 Admit, emergent CRRT, to OR for repair of distal Lt external iliac artery  Interim History / Subjective:  Remains on pressors, vent, heparin, CRRT, sedation.  Objective   Blood pressure (!) 69/49, pulse 78, temperature 98.3 F (36.8 C), temperature source Oral, resp. rate 19, height 5\' 8"  (1.727 m), weight 67.2 kg, SpO2 100 %.    Vent Mode: PRVC FiO2 (%):  [40 %] 40 % Set Rate:  [16 bmp] 16 bmp Vt Set:  [550 mL] 550 mL PEEP:  [5 cmH20] 5 cmH20 Plateau Pressure:  [15 cmH20] 15 cmH20   Intake/Output Summary (Last 24 hours) at 08/29/2022 0824 Last data filed at 08/29/2022 0700 Gross per 24 hour  Intake 3281.23 ml  Output 351.1 ml  Net 2930.13 ml    Filed Weights   08/28/22 1632 08/28/22 2227 08/29/22 0452  Weight: 72.6 kg 64.4 kg 67.2 kg    Examination:  General - sedated Eyes - pupils reactive ENT - ETT in place Cardiac - regular rate/rhythm, no murmur Chest - equal breath sounds b/l, no wheezing or rales Abdomen - soft, non tender, + bowel sounds Extremities - Lt lower leg cool Skin - no rashes Neuro - RASS -2  Resolved Hospital Problem list     Assessment & Plan:   Lt common femoral artery pseudoaneurysm s/p Lt groin hematoma  evacuation, primary repair of distal external iliac artery. - continue heparin gtt - post op care per vascular surgery  Anion gap metabolic acidosis with lactic acidosis. Hyperkalemia. ESRD. - continue CRRT - f/u BMET, ABG  Acute respiratory failure 2nd to acidosis and uremia. Hx of COPD. - full vent support - f/u CXR intermittently - goal SpO2 > 92%  Anemia of critical illness and chronic disease. - f/u CBC - transfuse for Hb < 7  Chronic systolic CHF with EF 35 to 40% on Echo from 06/29/22. Hx of pulmonary hypertension. - pressors to keep MAP > 65 - hold outpt toprol, plavix  Acute metabolic encephalopathy from uremia, acidosis. - RASS goal 0 to -1  Best Practice (right click and "Reselect all SmartList Selections" daily)   Diet/type: tubefeeds DVT prophylaxis: systemic heparin plan for surgery  GI prophylaxis: PPI Lines: Dialysis Catheter and Arterial Line Foley:  N/A Code Status:  full code Last date of multidisciplinary goals of care discussion [x]   Labs       Latest Ref Rng & Units 08/29/2022    5:00 AM 08/29/2022    4:31 AM 08/29/2022    2:10 AM  CMP  Glucose 70 - 99 mg/dL 177     BUN 8 - 23 mg/dL 106     Creatinine 0.61 - 1.24 mg/dL 12.31     Sodium 135 - 145  mmol/L 136  136    Potassium 3.5 - 5.1 mmol/L 4.8  5.1  5.6   Chloride 98 - 111 mmol/L 96     CO2 22 - 32 mmol/L 19     Calcium 8.9 - 10.3 mg/dL 8.1          Latest Ref Rng & Units 08/29/2022    4:31 AM 08/29/2022    2:10 AM 08/29/2022    1:10 AM  CBC  WBC 4.0 - 10.5 K/uL  19.5    Hemoglobin 13.0 - 17.0 g/dL 10.2  9.9  7.8   Hematocrit 39.0 - 52.0 % 30.0  28.8  23.0   Platelets 150 - 400 K/uL  221      ABG    Component Value Date/Time   PHART 7.258 (L) 08/29/2022 0110   PCO2ART 40.3 08/29/2022 0110   PO2ART 164 (H) 08/29/2022 0110   HCO3 19.1 (L) 08/29/2022 0431   TCO2 20 (L) 08/29/2022 0431   ACIDBASEDEF 7.0 (H) 08/29/2022 0431   O2SAT 98 08/29/2022 0431    CBG (last 3)  Recent  Labs    08/29/22 0154 08/29/22 0334 08/29/22 0738  GLUCAP 157* 148* 165*     Critical care time: 38 minutes   Chesley Mires, MD [place] Pager - [phone] or [phone] 08/29/2022, 8:37 AM

## 2022-08-30 ENCOUNTER — Encounter (HOSPITAL_COMMUNITY): Payer: Self-pay | Admitting: Vascular Surgery

## 2022-08-30 ENCOUNTER — Inpatient Hospital Stay (HOSPITAL_COMMUNITY): Payer: Medicare Other

## 2022-08-30 ENCOUNTER — Other Ambulatory Visit: Payer: Self-pay

## 2022-08-30 DIAGNOSIS — R579 Shock, unspecified: Secondary | ICD-10-CM | POA: Diagnosis not present

## 2022-08-30 DIAGNOSIS — J9601 Acute respiratory failure with hypoxia: Secondary | ICD-10-CM | POA: Diagnosis not present

## 2022-08-30 DIAGNOSIS — I729 Aneurysm of unspecified site: Secondary | ICD-10-CM | POA: Diagnosis not present

## 2022-08-30 LAB — RENAL FUNCTION PANEL
Albumin: 2.1 g/dL — ABNORMAL LOW (ref 3.5–5.0)
Albumin: 1.9 g/dL — ABNORMAL LOW (ref 3.5–5.0)
Anion gap: 15 (ref 5–15)
Anion gap: 11 (ref 5–15)
BUN: 49 mg/dL — ABNORMAL HIGH (ref 8–23)
BUN: 37 mg/dL — ABNORMAL HIGH (ref 8–23)
CO2: 23 mmol/L (ref 22–32)
CO2: 24 mmol/L (ref 22–32)
Calcium: 8.3 mg/dL — ABNORMAL LOW (ref 8.9–10.3)
Calcium: 8.2 mg/dL — ABNORMAL LOW (ref 8.9–10.3)
Chloride: 98 mmol/L (ref 98–111)
Chloride: 101 mmol/L (ref 98–111)
Creatinine, Ser: 5.05 mg/dL — ABNORMAL HIGH (ref 0.61–1.24)
Creatinine, Ser: 3.61 mg/dL — ABNORMAL HIGH (ref 0.61–1.24)
GFR, Estimated: 12 mL/min — ABNORMAL LOW (ref >60)
GFR, Estimated: 18 mL/min — ABNORMAL LOW (ref >60)
Glucose, Bld: 148 mg/dL — ABNORMAL HIGH (ref 70–99)
Glucose, Bld: 114 mg/dL — ABNORMAL HIGH (ref 70–99)
Phosphorus: 3.7 mg/dL (ref 2.5–4.6)
Phosphorus: 2.8 mg/dL (ref 2.5–4.6)
Potassium: 4.8 mmol/L (ref 3.5–5.1)
Potassium: 5.1 mmol/L (ref 3.5–5.1)
Sodium: 136 mmol/L (ref 135–145)
Sodium: 136 mmol/L (ref 135–145)

## 2022-08-30 LAB — POCT I-STAT 7, (LYTES, BLD GAS, ICA,H+H)
Acid-Base Excess: 0 mmol/L (ref 0.0–2.0)
Acid-base deficit: 1 mmol/L (ref 0.0–2.0)
Bicarbonate: 24.3 mmol/L (ref 20.0–28.0)
Bicarbonate: 22.2 mmol/L (ref 20.0–28.0)
Calcium, Ion: 1.15 mmol/L (ref 1.15–1.40)
Calcium, Ion: 1.12 mmol/L — ABNORMAL LOW (ref 1.15–1.40)
HCT: 27 % — ABNORMAL LOW (ref 39.0–52.0)
HCT: 25 % — ABNORMAL LOW (ref 39.0–52.0)
Hemoglobin: 9.2 g/dL — ABNORMAL LOW (ref 13.0–17.0)
Hemoglobin: 8.5 g/dL — ABNORMAL LOW (ref 13.0–17.0)
O2 Saturation: 98 %
O2 Saturation: 91 %
Patient temperature: 98.7
Patient temperature: 100.3
Potassium: 4.9 mmol/L (ref 3.5–5.1)
Potassium: 5.1 mmol/L (ref 3.5–5.1)
Sodium: 136 mmol/L (ref 135–145)
Sodium: 135 mmol/L (ref 135–145)
TCO2: 25 mmol/L (ref 22–32)
TCO2: 23 mmol/L (ref 22–32)
pCO2 arterial: 37.9 mmHg (ref 32–48)
pCO2 arterial: 30.5 mmHg — ABNORMAL LOW (ref 32–48)
pH, Arterial: 7.415 (ref 7.35–7.45)
pH, Arterial: 7.473 — ABNORMAL HIGH (ref 7.35–7.45)
pO2, Arterial: 96 mmHg (ref 83–108)
pO2, Arterial: 60 mmHg — ABNORMAL LOW (ref 83–108)

## 2022-08-30 LAB — GLUCOSE, CAPILLARY
Glucose-Capillary: 136 mg/dL — ABNORMAL HIGH (ref 70–99)
Glucose-Capillary: 114 mg/dL — ABNORMAL HIGH (ref 70–99)
Glucose-Capillary: 119 mg/dL — ABNORMAL HIGH (ref 70–99)
Glucose-Capillary: 108 mg/dL — ABNORMAL HIGH (ref 70–99)
Glucose-Capillary: 109 mg/dL — ABNORMAL HIGH (ref 70–99)
Glucose-Capillary: 98 mg/dL (ref 70–99)
Glucose-Capillary: 103 mg/dL — ABNORMAL HIGH (ref 70–99)

## 2022-08-30 LAB — BPAM RBC
Blood Product Expiration Date: 202404152359
Blood Product Expiration Date: 202404152359
ISSUE DATE / TIME: 202403230012
ISSUE DATE / TIME: 202403230012
Unit Type and Rh: 5100
Unit Type and Rh: 5100

## 2022-08-30 LAB — BLOOD CULTURE ID PANEL (REFLEXED) - BCID2

## 2022-08-30 LAB — TYPE AND SCREEN
ABO/RH(D): O POS
Antibody Screen: NEGATIVE
Unit division: 0
Unit division: 0

## 2022-08-30 LAB — CBC
HCT: 26.9 % — ABNORMAL LOW (ref 39.0–52.0)
Hemoglobin: 8.9 g/dL — ABNORMAL LOW (ref 13.0–17.0)
MCH: 31 pg (ref 26.0–34.0)
MCHC: 33.1 g/dL (ref 30.0–36.0)
MCV: 93.7 fL (ref 80.0–100.0)
Platelets: 213 10*3/uL (ref 150–400)
RBC: 2.87 MIL/uL — ABNORMAL LOW (ref 4.22–5.81)
RDW: 21 % — ABNORMAL HIGH (ref 11.5–15.5)
WBC: 19.3 10*3/uL — ABNORMAL HIGH (ref 4.0–10.5)
nRBC: 0 % (ref 0.0–0.2)

## 2022-08-30 LAB — HEPARIN LEVEL (UNFRACTIONATED)
Heparin Unfractionated: 0.15 IU/mL — ABNORMAL LOW (ref 0.30–0.70)
Heparin Unfractionated: 0.2 IU/mL — ABNORMAL LOW (ref 0.30–0.70)
Heparin Unfractionated: 0.21 IU/mL — ABNORMAL LOW (ref 0.30–0.70)

## 2022-08-30 LAB — TRIGLYCERIDES: Triglycerides: 166 mg/dL — ABNORMAL HIGH (ref <150)

## 2022-08-30 LAB — MAGNESIUM: Magnesium: 2.5 mg/dL — ABNORMAL HIGH (ref 1.7–2.4)

## 2022-08-30 MED ORDER — FENTANYL CITRATE PF 50 MCG/ML IJ SOSY
50.0000 ug | PREFILLED_SYRINGE | INTRAMUSCULAR | Status: DC | PRN
  Administered 2022-08-31: 50 ug via INTRAVENOUS
  Filled 2022-08-30: qty 1

## 2022-08-30 MED ORDER — PRISMASOL BGK 0/2.5 32-2.5 MEQ/L EC SOLN
Status: DC
  Filled 2022-08-30 (×5): qty 5000

## 2022-08-30 MED ORDER — PRISMASOL BGK 0/2.5 32-2.5 MEQ/L EC SOLN
Status: DC
  Filled 2022-08-30 (×9): qty 5000

## 2022-08-30 MED ORDER — VANCOMYCIN HCL 1500 MG/300ML IV SOLN
1500.0000 mg | INTRAVENOUS | Status: AC
  Administered 2022-08-30: 1500 mg via INTRAVENOUS
  Filled 2022-08-30: qty 300

## 2022-08-30 MED ORDER — ACETAMINOPHEN 325 MG PO TABS
650.0000 mg | ORAL_TABLET | Freq: Four times a day (QID) | ORAL | Status: DC | PRN
  Administered 2022-08-31: 650 mg
  Filled 2022-08-30: qty 2

## 2022-08-30 MED ORDER — ORAL CARE MOUTH RINSE: 15.0000 mL | OROMUCOSAL | Status: DC | PRN

## 2022-08-30 MED ORDER — ACETAMINOPHEN 650 MG RE SUPP: 650.0000 mg | RECTAL | Status: DC | PRN

## 2022-08-30 MED ORDER — RENA-VITE PO TABS
1.0000 | ORAL_TABLET | Freq: Every day | ORAL | Status: DC
  Administered 2022-08-31: 1
  Filled 2022-08-30: qty 1

## 2022-08-30 NOTE — Progress Notes (Signed)
PHARMACY - PHYSICIAN COMMUNICATION CRITICAL VALUE ALERT - BLOOD CULTURE IDENTIFICATION (BCID)  Albert Harris is an 67 y.o. male who presented to [facility] on 08/28/2022 - s/p L groin hematoma evac and repair of distal external iliac artery.   Assessment:  MRSA bacteremia  Name of physician (or Provider) Contacted: Dr. Halford Chessman  Current antibiotics: None  Changes to prescribed antibiotics recommended:  Vancomycin per pharmacy  and pt will get auto ID consult  Results for orders placed or performed during the hospital encounter of 08/28/22  Blood Culture ID Panel (Reflexed) (Collected: 08/29/2022  6:50 AM)  Result Value Ref Range   Enterococcus faecalis NOT DETECTED NOT DETECTED   Enterococcus Faecium NOT DETECTED NOT DETECTED   Listeria monocytogenes NOT DETECTED NOT DETECTED   Staphylococcus species DETECTED (A) NOT DETECTED   Staphylococcus aureus (BCID) DETECTED (A) NOT DETECTED   Staphylococcus epidermidis NOT DETECTED NOT DETECTED   Staphylococcus lugdunensis NOT DETECTED NOT DETECTED   Streptococcus species NOT DETECTED NOT DETECTED   Streptococcus agalactiae NOT DETECTED NOT DETECTED   Streptococcus pneumoniae NOT DETECTED NOT DETECTED   Streptococcus pyogenes NOT DETECTED NOT DETECTED   A.calcoaceticus-baumannii NOT DETECTED NOT DETECTED   Bacteroides fragilis NOT DETECTED NOT DETECTED   Enterobacterales NOT DETECTED NOT DETECTED   Enterobacter cloacae complex NOT DETECTED NOT DETECTED   Escherichia coli NOT DETECTED NOT DETECTED   Klebsiella aerogenes NOT DETECTED NOT DETECTED   Klebsiella oxytoca NOT DETECTED NOT DETECTED   Klebsiella pneumoniae NOT DETECTED NOT DETECTED   Proteus species NOT DETECTED NOT DETECTED   Salmonella species NOT DETECTED NOT DETECTED   Serratia marcescens NOT DETECTED NOT DETECTED   Haemophilus influenzae NOT DETECTED NOT DETECTED   Neisseria meningitidis NOT DETECTED NOT DETECTED   Pseudomonas aeruginosa NOT DETECTED NOT DETECTED    Stenotrophomonas maltophilia NOT DETECTED NOT DETECTED   Candida albicans NOT DETECTED NOT DETECTED   Candida auris NOT DETECTED NOT DETECTED   Candida glabrata NOT DETECTED NOT DETECTED   Candida krusei NOT DETECTED NOT DETECTED   Candida parapsilosis NOT DETECTED NOT DETECTED   Candida tropicalis NOT DETECTED NOT DETECTED   Cryptococcus neoformans/gattii NOT DETECTED NOT DETECTED   Meth resistant mecA/C and MREJ DETECTED (A) NOT DETECTED    Sherlon Handing, PharmD, BCPS Please see amion for complete clinical pharmacist phone list 08/30/2022  5:45 PM  Pharmacy Antibiotic Note  Pharmacy has been consulted for Vancomycin dosing. Noted pt is ESRD - currently on CRRT.  Plan: Vancomycin 1500mg  IV now then 750 mg IV Q 24 hrs  Will f/u CRRT tolerance, micro data, and pt's clinical condition Vanc level prn   Height: 5\' 8"  (172.7 cm) Weight: 65.9 kg (145 lb 4.5 oz) IBW/kg (Calculated) : 68.4  Temp (24hrs), Avg:99 F (37.2 C), Min:98 F (36.7 C), Max:100.5 F (38.1 C)  Recent Labs  Lab 08/24/22 0027 08/28/22 1810 08/28/22 1811 08/28/22 2050 08/29/22 0210 08/29/22 0434 08/29/22 0500 08/29/22 0759 08/29/22 1120 08/29/22 1510 08/30/22 0411 08/30/22 1559  WBC 23.8* 19.2*  --   --  19.5*  --   --   --   --   --  19.3*  --   CREATININE 6.83* 15.92*  --   --   --   --  12.31*  --   --  11.04* 5.05* 3.61*  LATICACIDVEN  --   --    < > >9.0* 6.0* 3.8*  --  1.9 1.5  --   --   --    < > =  values in this interval not displayed.    Estimated Creatinine Clearance: 18.3 mL/min (A) (by C-G formula based on SCr of 3.61 mg/dL (H)).    Allergies  Allergen Reactions   Benadryl [Diphenhydramine] Rash   Lidocaine-Prilocaine Rash    Caused red marks up and down arm    Antimicrobials this admission: 3/23 Ancef x 1 3/24 Vanc >>   Microbiology results: 3/23 BCx >> MRSA 3/22 MRSA PCR negative  Thank you for allowing pharmacy to be a part of this patient's care.  Sherlon Handing, PharmD,  BCPS Please see amion for complete clinical pharmacist phone list 08/30/2022 5:47 PM

## 2022-08-30 NOTE — Progress Notes (Signed)
ANTICOAGULATION CONSULT NOTE Pharmacy Consult for Heparin Indication:  lower extremity limb ischemia  Allergies  Allergen Reactions   Benadryl [Diphenhydramine] Rash   Lidocaine-Prilocaine Rash    Caused red marks up and down arm    Patient Measurements: Height: 5\' 8"  (172.7 cm) Weight: 65.9 kg (145 lb 4.5 oz) IBW/kg (Calculated) : 68.4 Heparin Dosing Weight: 64.4 kg  Vital Signs: Temp: 99.5 F (37.5 C) (03/24 1933) Temp Source: Axillary (03/24 1933) Pulse Rate: 99 (03/24 1900)  Labs: Recent Labs    08/28/22 1810 08/28/22 2224 08/29/22 0210 08/29/22 0431 08/29/22 1510 08/29/22 2344 08/30/22 0408 08/30/22 0411 08/30/22 0935 08/30/22 1559 08/30/22 1819 08/30/22 1950  HGB 8.7*   < > 9.9*   < >  --   --  9.2* 8.9*  --   --  8.5*  --   HCT 27.2*   < > 28.8*   < >  --   --  27.0* 26.9*  --   --  25.0*  --   PLT 329  --  221  --   --   --   --  213  --   --   --   --   HEPARINUNFRC  --   --   --    < >  --  0.15*  --   --  0.20*  --   --  0.21*  CREATININE 15.92*  --   --    < > 11.04*  --   --  5.05*  --  3.61*  --   --    < > = values in this interval not displayed.    Assessment: 67 yr old male with hx LLE thromboembolectomy s/p left groin pseudoaneurysm repair and hematoma evacuation for heparin.   Heparin level low remains subtherapeutic (0.21) - no real movement with past rate increase. No issues with line or bleeding reported per RN. Remains on CRRT.   Goal of Therapy:  Heparin level 0.3-0.5 units/ml  Monitor platelets by anticoagulation protocol: Yes   Plan:  Increase Heparin to 1450 units/hr Check heparin level in 8 hours  Sherlon Handing, PharmD, BCPS Please see amion for complete clinical pharmacist phone list 08/30/2022,8:55 PM

## 2022-08-30 NOTE — Progress Notes (Signed)
ANTICOAGULATION CONSULT NOTE Pharmacy Consult for Heparin Indication:  lower extremity limb ischemia Brief A/P: Heparin level subtherapeutic Increase Heparin rate  Allergies  Allergen Reactions   Benadryl [Diphenhydramine] Rash   Lidocaine-Prilocaine Rash    Caused red marks up and down arm    Patient Measurements: Height: 5\' 8"  (172.7 cm) Weight: 67.2 kg (148 lb 2.4 oz) IBW/kg (Calculated) : 68.4 Heparin Dosing Weight: 64.4 kg  Vital Signs: Temp: 98.5 F (36.9 C) (03/23 2306) Temp Source: Axillary (03/23 2306) BP: 115/46 (03/23 1537) Pulse Rate: 72 (03/23 1900)  Labs: Recent Labs    08/28/22 1810 08/28/22 2224 08/29/22 0110 08/29/22 0210 08/29/22 0431 08/29/22 0500 08/29/22 1434 08/29/22 1510 08/29/22 2344  HGB 8.7*   < > 7.8* 9.9* 10.2*  --   --   --   --   HCT 27.2*   < > 23.0* 28.8* 30.0*  --   --   --   --   PLT 329  --   --  221  --   --   --   --   --   HEPARINUNFRC  --   --   --   --   --   --  0.14*  --  0.15*  CREATININE 15.92*  --   --   --   --  12.31*  --  11.04*  --    < > = values in this interval not displayed.     Assessment: 67 yr old male with hx LLE thromboembolectomy s/p left groin pseudoaneurysm repair and hematoma evacuation for heparin  Goal of Therapy:  Heparin level 0.3-0.5 units/ml  Monitor platelets by anticoagulation protocol: Yes   Plan:  Increase Heparin 1100 units/hr Check heparin level in 8 hours.   Phillis Knack, PharmD, BCPS  08/30/2022,1:47 AM

## 2022-08-30 NOTE — Progress Notes (Signed)
I saw and evaluated the patient on CRRT.  I reviewed the last 24 hours events.  Adjustments to CRRT prescription are made as needed. Maintain 4K and even.   Filed Weights   08/28/22 2227 08/29/22 0452 08/30/22 0415  Weight: 64.4 kg 67.2 kg 65.9 kg    Recent Labs  Lab 08/30/22 0411  NA 136  K 4.8  CL 98  CO2 23  GLUCOSE 148*  BUN 49*  CREATININE 5.05*  CALCIUM 8.3*  PHOS 3.7    Recent Labs  Lab 08/28/22 1810 08/28/22 2224 08/29/22 0210 08/29/22 0431 08/30/22 0408 08/30/22 0411  WBC 19.2*  --  19.5*  --   --  19.3*  NEUTROABS 16.6*  --   --   --   --   --   HGB 8.7*   < > 9.9* 10.2* 9.2* 8.9*  HCT 27.2*   < > 28.8* 30.0* 27.0* 26.9*  MCV 107.5*  --  91.7  --   --  93.7  PLT 329  --  221  --   --  213   < > = values in this interval not displayed.    Scheduled Meds:  Chlorhexidine Gluconate Cloth  6 each Topical Daily   feeding supplement (PROSource TF20)  60 mL Per Tube BID   multivitamin  1 tablet Per Tube QHS   mouth rinse  15 mL Mouth Rinse Q2H   pantoprazole (PROTONIX) IV  40 mg Intravenous Q24H   sodium chloride flush  10-40 mL Intracatheter Q12H   sodium zirconium cyclosilicate  10 g Oral Once   Continuous Infusions:   prismasol BGK 4/2.5 400 mL/hr at 08/29/22 2354    prismasol BGK 4/2.5 400 mL/hr at 08/29/22 2354   sodium chloride     sodium chloride     feeding supplement (VITAL 1.5 CAL) 20 mL/hr at 08/29/22 1900   heparin 1,100 Units/hr (08/30/22 0203)   norepinephrine (LEVOPHED) Adult infusion 8 mcg/min (08/30/22 0654)   prismasol BGK 4/2.5 1,000 mL/hr at 08/30/22 0655   propofol (DIPRIVAN) infusion 20 mcg/kg/min (08/29/22 1900)   PRN Meds:.Place/Maintain arterial line **AND** sodium chloride, docusate sodium, heparin, ondansetron (ZOFRAN) IV, mouth rinse, polyethylene glycol, sodium chloride, sodium chloride flush   Santiago Bumpers,  MD 08/30/2022, 7:59 AM

## 2022-08-30 NOTE — Progress Notes (Signed)
Noted to have decreased mental status.  Somnolent.  Mumbles words with stimulation and opens eyes.  Moves extremities, but not following commands.  Will get CT head.  Chesley Mires, MD [place] Pager - [phone] or [phone] 08/30/2022, 5:39 PM

## 2022-08-30 NOTE — Progress Notes (Signed)
ANTICOAGULATION CONSULT NOTE Pharmacy Consult for Heparin Indication:  lower extremity limb ischemia Brief A/P: Heparin level subtherapeutic Increase Heparin rate  Allergies  Allergen Reactions   Benadryl [Diphenhydramine] Rash   Lidocaine-Prilocaine Rash    Caused red marks up and down arm    Patient Measurements: Height: 5\' 8"  (172.7 cm) Weight: 65.9 kg (145 lb 4.5 oz) IBW/kg (Calculated) : 68.4 Heparin Dosing Weight: 64.4 kg  Vital Signs: Temp: 100.5 F (38.1 C) (03/24 0732) Temp Source: Oral (03/24 0732) Pulse Rate: 88 (03/24 1100)  Labs: Recent Labs    08/28/22 1810 08/28/22 2224 08/29/22 0210 08/29/22 0431 08/29/22 0500 08/29/22 1434 08/29/22 1510 08/29/22 2344 08/30/22 0408 08/30/22 0411 08/30/22 0935  HGB 8.7*   < > 9.9* 10.2*  --   --   --   --  9.2* 8.9*  --   HCT 27.2*   < > 28.8* 30.0*  --   --   --   --  27.0* 26.9*  --   PLT 329  --  221  --   --   --   --   --   --  213  --   HEPARINUNFRC  --   --   --   --   --  0.14*  --  0.15*  --   --  0.20*  CREATININE 15.92*  --   --   --  12.31*  --  11.04*  --   --  5.05*  --    < > = values in this interval not displayed.    Assessment: 67 yr old male with hx LLE thromboembolectomy s/p left groin pseudoaneurysm repair and hematoma evacuation for heparin.   Heparin level low at 0.2 - trending up on 1100 units/hr.  Remains on CRRT. Hgb 8.9 (trend down). Platelets within normal limits.   Goal of Therapy:  Heparin level 0.3-0.5 units/ml  Monitor platelets by anticoagulation protocol: Yes   Plan:  Increase Heparin 1300 units/hr Check heparin level in 8 hours.   Sloan Leiter, PharmD, BCPS, BCCCP Clinical Pharmacist Please refer to [facility] for [place] numbers 08/30/2022,11:02 AM

## 2022-08-30 NOTE — Progress Notes (Signed)
Nephrology Follow-Up Consult note   Assessment/Recommendations: Albert Harris is a/an 67 y.o. male with a past medical history significant for ESRD, PAD, CHF, HTN, admitted for critical limb ischemia.       ESRD: TTS and [place] via RUE AVG. Using CRRT for dialysis at this time given the patient's hemodynamic instability.  Continue CRRT as prescribed  Left common femoral artery pseudoaneurysm: Status post surgery with Dr. Unk Lightning on the morning of 08/29/2022 status post repair.  Continue following  Anion gap metabolic acidosis: Now resolved with renal placement therapy and optimization of hemodynamics  Shock: Likely multifactorial.  Continue with norepinephrine.  Requirements fluctuating but overall improved  Hyperkalemia: Resolved with renal replacement therapy  Anemia: Likely multifactorial.  Transfusions as needed  Metabolic encephalopathy: Likely multifactorial.  Did miss dialysis for a week.  Intubated per primary team  Dialysis access: AVG seems to be clotted from my assessment.  Continue to evaluate daily.  Possible intervention once the patient improves   Recommendations conveyed to primary service.    [facility] 08/30/2022 7:59 AM  ___________________________________________________________  CC: Femoral artery pseudoaneurysm  Interval History/Subjective: Remains critically ill but overall stable over the past 24 hours.  Norepinephrine overall improved.  Stable on CRRT   Medications:  Current Facility-Administered Medications  Medication Dose Route Frequency Provider Last Rate Last Admin    prismasol BGK 4/2.5 infusion   CRRT Continuous Reesa Chew, MD 400 mL/hr at 08/29/22 2354 New Bag at 08/29/22 2354    prismasol BGK 4/2.5 infusion   CRRT Continuous Reesa Chew, MD 400 mL/hr at 08/29/22 2354 New Bag at 08/29/22 2354   0.9 %  sodium chloride infusion   Intra-arterial PRN Baglia, Corrina, PA-C       0.9 %  sodium chloride  infusion  250 mL Intravenous Continuous Baglia, Corrina, PA-C       Chlorhexidine Gluconate Cloth 2 % PADS 6 each  6 each Topical Daily Baglia, Corrina, PA-C   6 each at 08/29/22 2127   docusate sodium (COLACE) capsule 100 mg  100 mg Oral BID PRN Baglia, Corrina, PA-C       feeding supplement (PROSource TF20) liquid 60 mL  60 mL Per Tube BID Chesley Mires, MD   60 mL at 08/29/22 2146   feeding supplement (VITAL 1.5 CAL) liquid 1,000 mL  1,000 mL Per Tube Continuous Chesley Mires, MD 20 mL/hr at 08/29/22 1900 Infusion Verify at 08/29/22 1900   heparin ADULT infusion 100 units/mL (25000 units/245mL)  1,100 Units/hr Intravenous Continuous Chesley Mires, MD 11 mL/hr at 08/30/22 0203 1,100 Units/hr at 08/30/22 0203   heparin injection 1,000-6,000 Units  1,000-6,000 Units CRRT PRN Baglia, Corrina, PA-C       multivitamin (RENA-VIT) tablet 1 tablet  1 tablet Per Tube QHS Chesley Mires, MD   1 tablet at 08/29/22 2146   norepinephrine (LEVOPHED) 16 mg in 265mL (0.064 mg/mL) premix infusion  0-40 mcg/min Intravenous Titrated Mauri Brooklyn, MD 7.5 mL/hr at 08/30/22 0654 8 mcg/min at 08/30/22 0654   ondansetron (ZOFRAN) injection 4 mg  4 mg Intravenous Q6H PRN Baglia, Corrina, PA-C       Oral care mouth rinse  15 mL Mouth Rinse Q2H Chesley Mires, MD   15 mL at 08/30/22 0525   Oral care mouth rinse  15 mL Mouth Rinse PRN Chesley Mires, MD       pantoprazole (PROTONIX) injection 40 mg  40 mg Intravenous Q24H Chesley Mires, MD   40  mg at 08/29/22 1104   polyethylene glycol (MIRALAX / GLYCOLAX) packet 17 g  17 g Oral Daily PRN Karoline Caldwell, PA-C       prismasol BGK 4/2.5 infusion   CRRT Continuous Reesa Chew, MD 1,000 mL/hr at 08/30/22 0655 New Bag at 08/30/22 0655   propofol (DIPRIVAN) 1000 MG/100ML infusion  5-80 mcg/kg/min Intravenous Titrated Mauri Brooklyn, MD 7.73 mL/hr at 08/29/22 1900 20 mcg/kg/min at 08/29/22 1900   sodium chloride 0.9 % primer fluid for CRRT   CRRT PRN Karoline Caldwell, PA-C   Given at  08/29/22 0335   sodium chloride flush (NS) 0.9 % injection 10-40 mL  10-40 mL Intracatheter Q12H Baglia, Corrina, PA-C   30 mL at 08/29/22 2131   sodium chloride flush (NS) 0.9 % injection 10-40 mL  10-40 mL Intracatheter PRN Baglia, Corrina, PA-C       sodium zirconium cyclosilicate (LOKELMA) packet 10 g  10 g Oral Once Baglia, Corrina, PA-C          Review of Systems: Unable to obtain due to the patient's sedation  Physical Exam: Vitals:   08/30/22 0600 08/30/22 0732  BP:    Pulse: 75   Resp: 16   Temp:  (!) 100.5 F (38.1 C)  SpO2: 100%    No intake/output data recorded.  Intake/Output Summary (Last 24 hours) at 08/30/2022 0759 Last data filed at 08/30/2022 0700 Gross per 24 hour  Intake 1196.03 ml  Output 1587.8 ml  Net -391.77 ml   Constitutional: Critically ill-appearing, lying in bed, sedated ENMT: ears and nose without scars or lesions, MMM CV: normal rate, no edema Respiratory: Coarse bilateral breath sounds, bilateral chest rise, intubated, ventilated Gastrointestinal: soft, nonended Skin: no visible lesions or rashes Psych: Sedated, not interactive Access: Right upper extremity AVG, no bruit or thrill   Test Results I personally reviewed new and old clinical labs and radiology tests Lab Results  Component Value Date   NA 136 08/30/2022   K 4.8 08/30/2022   CL 98 08/30/2022   CO2 23 08/30/2022   BUN 49 (H) 08/30/2022   CREATININE 5.05 (H) 08/30/2022   GFR 58.30 (L) 01/19/2017   CALCIUM 8.3 (L) 08/30/2022   ALBUMIN 2.1 (L) 08/30/2022   PHOS 3.7 08/30/2022    CBC Recent Labs  Lab 08/28/22 1810 08/28/22 2224 08/29/22 0210 08/29/22 0431 08/30/22 0408 08/30/22 0411  WBC 19.2*  --  19.5*  --   --  19.3*  NEUTROABS 16.6*  --   --   --   --   --   HGB 8.7*   < > 9.9* 10.2* 9.2* 8.9*  HCT 27.2*   < > 28.8* 30.0* 27.0* 26.9*  MCV 107.5*  --  91.7  --   --  93.7  PLT 329  --  221  --   --  213   < > = values in this interval not displayed.

## 2022-08-30 NOTE — Progress Notes (Signed)
NAME:  Albert Harris, MRN:  [id number], DOB:  [birth year], LOS: 2 ADMISSION DATE:  08/28/2022, CONSULTATION DATE:  08/28/22 REFERRING MD: Jeannine Kitten , CHIEF COMPLAINT:  Abdominal pain  History of Present Illness:  67 yo male brought to ER with abdominal pain, nausea, diaphoresis and unable to feel his Lt foot.  Hypotensive with lactic acidosis in ER.  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  Hx of ESRD and had hyperkalemia.  Had emergent CRRT.  Vascular consulted and taken to OR.    Pertinent  Medical History  ESRD -AV fistula (no flow tonight), PAD, COPD, HLD, HTN, HFrEF 35-40%, PAD  Significant Hospital Events: Including procedures, antibiotic start and stop dates in addition to other pertinent events   3/22 Admit, emergent CRRT, to OR for repair of distal Lt external iliac artery  Interim History / Subjective:  Remains on pressors, sedation, vent, CRRT.  Objective   Blood pressure (!) 115/46, pulse 80, temperature (!) 100.5 F (38.1 C), temperature source Oral, resp. rate (!) 26, height 5\' 8"  (1.727 m), weight 65.9 kg, SpO2 95 %.    Vent Mode: PRVC FiO2 (%):  [30 %-40 %] 30 % Set Rate:  [16 bmp] 16 bmp Vt Set:  [550 mL] 550 mL PEEP:  [5 cmH20] 5 cmH20 Plateau Pressure:  [14 cmH20-17 cmH20] 14 cmH20   Intake/Output Summary (Last 24 hours) at 08/30/2022 [id number] Last data filed at 08/30/2022 0800 Gross per 24 hour  Intake 1202.76 ml  Output 1592.8 ml  Net -390.04 ml   Filed Weights   08/28/22 2227 08/29/22 0452 08/30/22 0415  Weight: 64.4 kg 67.2 kg 65.9 kg    Examination:  General - alert Eyes - pupils reactive ENT - no sinus tenderness, no stridor Cardiac - regular rate/rhythm, no murmur Chest - equal breath sounds b/l, no wheezing or rales Abdomen - soft, non tender, + bowel sounds Extremities - Lt foot warmer Skin - no rashes Neuro - RASS -1  Resolved Hospital Problem list   Anion gap metabolic acidosis with lactic acidosis,  Hyperkalemia  Assessment & Plan:   Lt common femoral artery pseudoaneurysm s/p Lt groin hematoma evacuation, primary repair of distal external iliac artery. - continue heparin gtt - post op care per vascular surgery  ESRD. - CRRT per nephrology  Acute respiratory failure 2nd to acidosis and uremia. Hx of COPD. - extubation trial planned 3/24  Anemia of critical illness and chronic disease. - f/u CBC - transfuse for Hb < 7  Chronic systolic CHF with EF 35 to 40% on Echo from 06/29/22. Hx of pulmonary hypertension. - pressors to keep MAP > 65 - hold outpt toprol, plavix  Acute metabolic encephalopathy from uremia, acidosis. - limit sedation while weaning vent  Best Practice (right click and "Reselect all SmartList Selections" daily)   Diet/type: tubefeeds DVT prophylaxis: systemic heparin plan for surgery  GI prophylaxis: PPI Lines: Dialysis Catheter and Arterial Line Foley:  N/A Code Status:  full code Last date of multidisciplinary goals of care discussion [x]   Labs       Latest Ref Rng & Units 08/30/2022    4:11 AM 08/30/2022    4:08 AM 08/29/2022    3:10 PM  CMP  Glucose 70 - 99 mg/dL 148   127   BUN 8 - 23 mg/dL 49   101   Creatinine 0.61 - 1.24 mg/dL 5.05   11.04   Sodium 135 - 145 mmol/L 136  136  140  Potassium 3.5 - 5.1 mmol/L 4.8  4.9  4.4   Chloride 98 - 111 mmol/L 98   93   CO2 22 - 32 mmol/L 23   20   Calcium 8.9 - 10.3 mg/dL 8.3   8.0        Latest Ref Rng & Units 08/30/2022    4:11 AM 08/30/2022    4:08 AM 08/29/2022    4:31 AM  CBC  WBC 4.0 - 10.5 K/uL 19.3     Hemoglobin 13.0 - 17.0 g/dL 8.9  9.2  10.2   Hematocrit 39.0 - 52.0 % 26.9  27.0  30.0   Platelets 150 - 400 K/uL 213       ABG    Component Value Date/Time   PHART 7.415 08/30/2022 0408   PCO2ART 37.9 08/30/2022 0408   PO2ART 96 08/30/2022 0408   HCO3 24.3 08/30/2022 0408   TCO2 25 08/30/2022 0408   ACIDBASEDEF 7.0 (H) 08/29/2022 0431   O2SAT 98 08/30/2022 0408    CBG  (last 3)  Recent Labs    08/29/22 2306 08/30/22 0309 08/30/22 0731  GLUCAP 147* 136* 114*     Critical care time: 33 minutes   Chesley Mires, MD [place] Pager - [phone] or [phone] 08/30/2022, 8:32 AM

## 2022-08-30 NOTE — Progress Notes (Signed)
MRSA in blood culture.  Will start vancomycin.  Will get TTE.  Assume ID will consult.  Chesley Mires, MD [place] Pager - [phone] or [phone] 08/30/2022, 5:20 PM

## 2022-08-30 NOTE — Progress Notes (Signed)
Late Entry Note ;   Noted on  assessment : what appears to be extensive maroon/purple colored sacrum/ perineum. Concerns for deep tissue injury, foam dressing in place : every 2 hour turn continued; wound ostomy nurse referral placed. Contributing factor high vasopressor demand.

## 2022-08-30 NOTE — Progress Notes (Signed)
  Daily Progress Note  S/p:Left femoral exploration, pseudoaneurysm repair.   Subjective: Extubated, somnolent   Objective: Vitals:   08/30/22 0915 08/30/22 1000  BP:    Pulse: 87 89  Resp: (!) 22 (!) 23  Temp:    SpO2: 95% 94%    Physical Examination Soft abdomen Improved left peroneal signal - remians venous to light monophasic VAC to suction Regular rate Cough s/p extubation   ASSESSMENT/PLAN:  2 Days Post-Op Left femoral exploration, pseudoaneurysm repair  Pressor is off VAC is to suction Currently on CRRT Clotted was pretty somnolent status post extubation, was able to answer questions. I had a long conversation with his daughter who was bedside.  Similar to our discussions previously, the foot appears viable, however there is no question he has severe peripheral arterial disease.  At this point, show wound form, or rest pain return, we will discuss amputation.  No need for elective amputation at this time. Groin appears to be healing well. Should his clinical status continue to improve, we discussed possible [place] embolectomy of his right arm AV loop graft to salvage his HD access at some point next week.      Cassandria Santee MD MS [facility] [phone] 08/30/2022  10:34 AM

## 2022-08-31 ENCOUNTER — Inpatient Hospital Stay (HOSPITAL_COMMUNITY): Payer: Medicare Other

## 2022-08-31 DIAGNOSIS — Z01810 Encounter for preprocedural cardiovascular examination: Secondary | ICD-10-CM

## 2022-08-31 DIAGNOSIS — R7881 Bacteremia: Secondary | ICD-10-CM | POA: Diagnosis not present

## 2022-08-31 DIAGNOSIS — T82598A Other mechanical complication of other cardiac and vascular devices and implants, initial encounter: Secondary | ICD-10-CM | POA: Diagnosis not present

## 2022-08-31 DIAGNOSIS — E872 Acidosis, unspecified: Secondary | ICD-10-CM | POA: Diagnosis not present

## 2022-08-31 LAB — GLUCOSE, CAPILLARY
Glucose-Capillary: 100 mg/dL — ABNORMAL HIGH (ref 70–99)
Glucose-Capillary: 100 mg/dL — ABNORMAL HIGH (ref 70–99)
Glucose-Capillary: 103 mg/dL — ABNORMAL HIGH (ref 70–99)
Glucose-Capillary: 104 mg/dL — ABNORMAL HIGH (ref 70–99)
Glucose-Capillary: 104 mg/dL — ABNORMAL HIGH (ref 70–99)
Glucose-Capillary: 98 mg/dL (ref 70–99)

## 2022-08-31 LAB — CBC
HCT: 23.4 % — ABNORMAL LOW (ref 39.0–52.0)
Hemoglobin: 7.7 g/dL — ABNORMAL LOW (ref 13.0–17.0)
MCH: 32.1 pg (ref 26.0–34.0)
MCHC: 32.9 g/dL (ref 30.0–36.0)
MCV: 97.5 fL (ref 80.0–100.0)
Platelets: 182 10*3/uL (ref 150–400)
RBC: 2.4 MIL/uL — ABNORMAL LOW (ref 4.22–5.81)
RDW: 20.2 % — ABNORMAL HIGH (ref 11.5–15.5)
WBC: 33.4 10*3/uL — ABNORMAL HIGH (ref 4.0–10.5)
nRBC: 0.1 % (ref 0.0–0.2)

## 2022-08-31 LAB — RENAL FUNCTION PANEL
Albumin: 1.9 g/dL — ABNORMAL LOW (ref 3.5–5.0)
Albumin: 1.9 g/dL — ABNORMAL LOW (ref 3.5–5.0)
Anion gap: 9 (ref 5–15)
Anion gap: 10 (ref 5–15)
BUN: 31 mg/dL — ABNORMAL HIGH (ref 8–23)
BUN: 27 mg/dL — ABNORMAL HIGH (ref 8–23)
CO2: 24 mmol/L (ref 22–32)
CO2: 24 mmol/L (ref 22–32)
Calcium: 7.8 mg/dL — ABNORMAL LOW (ref 8.9–10.3)
Calcium: 8.1 mg/dL — ABNORMAL LOW (ref 8.9–10.3)
Chloride: 101 mmol/L (ref 98–111)
Chloride: 100 mmol/L (ref 98–111)
Creatinine, Ser: 3.06 mg/dL — ABNORMAL HIGH (ref 0.61–1.24)
Creatinine, Ser: 2.35 mg/dL — ABNORMAL HIGH (ref 0.61–1.24)
GFR, Estimated: 21 mL/min — ABNORMAL LOW (ref >60)
GFR, Estimated: 29 mL/min — ABNORMAL LOW (ref >60)
Glucose, Bld: 104 mg/dL — ABNORMAL HIGH (ref 70–99)
Glucose, Bld: 107 mg/dL — ABNORMAL HIGH (ref 70–99)
Phosphorus: 3.4 mg/dL (ref 2.5–4.6)
Phosphorus: 2.9 mg/dL (ref 2.5–4.6)
Potassium: 4.9 mmol/L (ref 3.5–5.1)
Potassium: 4.4 mmol/L (ref 3.5–5.1)
Sodium: 134 mmol/L — ABNORMAL LOW (ref 135–145)
Sodium: 134 mmol/L — ABNORMAL LOW (ref 135–145)

## 2022-08-31 LAB — HEPARIN LEVEL (UNFRACTIONATED)
Heparin Unfractionated: 0.3 IU/mL (ref 0.30–0.70)
Heparin Unfractionated: 0.2 IU/mL — ABNORMAL LOW (ref 0.30–0.70)

## 2022-08-31 LAB — ECHOCARDIOGRAM LIMITED
Area-P 1/2: 2.73 cm2
Height: 68 in
S' Lateral: 3 cm
Weight: 2310.42 oz

## 2022-08-31 LAB — MAGNESIUM: Magnesium: 2.5 mg/dL — ABNORMAL HIGH (ref 1.7–2.4)

## 2022-08-31 MED ORDER — VANCOMYCIN HCL 750 MG/150ML IV SOLN
750.0000 mg | INTRAVENOUS | Status: DC
  Administered 2022-08-31 – 2022-09-01 (×2): 750 mg via INTRAVENOUS
  Filled 2022-08-31 (×2): qty 150

## 2022-08-31 NOTE — Progress Notes (Addendum)
Progress Note    08/31/2022 7:47 AM 3 Days Post-Op  Subjective:  wakes easily; denies pain in the left foot  Tm 100.3 now afebrile  Vitals:   08/31/22 0700 08/31/22 0732  BP:    Pulse: 92   Resp: (!) 28   Temp:  98.6 F (37 C)  SpO2: 96%     Physical Exam: General:  no distress; wakes easily Cardiac:  regular Lungs:  non labored Incisions:  left groin with Prevena vac with good seal Extremities:  probable mixed venous/arterial doppler signal left peroneal; left calf is soft and non tender  CBC    Component Value Date/Time   WBC 33.4 (H) 08/31/2022 0330   RBC 2.40 (L) 08/31/2022 0330   HGB 7.7 (L) 08/31/2022 0330   HGB 10.4 (L) 06/18/2021 1431   HCT 23.4 (L) 08/31/2022 0330   HCT 31.6 (L) 06/18/2021 1431   PLT 182 08/31/2022 0330   PLT 142 (L) 06/18/2021 1431   MCV 97.5 08/31/2022 0330   MCV 96 06/18/2021 1431   MCH 32.1 08/31/2022 0330   MCHC 32.9 08/31/2022 0330   RDW 20.2 (H) 08/31/2022 0330   RDW 14.3 06/18/2021 1431   LYMPHSABS 1.5 08/28/2022 1810   LYMPHSABS 1.5 06/18/2021 1431   MONOABS 0.7 08/28/2022 1810   EOSABS 0.1 08/28/2022 1810   EOSABS 1.0 (H) 06/18/2021 1431   BASOSABS 0.1 08/28/2022 1810   BASOSABS 0.1 06/18/2021 1431    BMET    Component Value Date/Time   NA 134 (L) 08/31/2022 0330   NA 141 06/18/2021 1431   K 4.9 08/31/2022 0330   CL 101 08/31/2022 0330   CO2 24 08/31/2022 0330   GLUCOSE 104 (H) 08/31/2022 0330   BUN 31 (H) 08/31/2022 0330   BUN 20 06/18/2021 1431   CREATININE 3.06 (H) 08/31/2022 0330   CREATININE 1.15 02/19/2017 1049   CALCIUM 7.8 (L) 08/31/2022 0330   CALCIUM 8.5 (L) 06/10/2020 0900   GFRNONAA 21 (L) 08/31/2022 0330   GFRAA 6 (L) 03/04/2020 1045    INR    Component Value Date/Time   INR 1.2 08/12/2022 1501     Intake/Output Summary (Last 24 hours) at 08/31/2022 0747 Last data filed at 08/31/2022 0700 Gross per 24 hour  Intake 697.97 ml  Output 899.8 ml  Net -201.83 ml      Assessment/Plan:   67 y.o. male is s/p:  femoral exploration, pseudoaneurysm repair.   3 Days Post-Op   -pt with probable mixed venous/arterial doppler signal left peroneal.  Pt denies pain in his left foot.  Left calf is soft and non tender.  -Dr. Virl Cagey had discussion with pt daughter yesterday, that foot appeared viable (still does today) but if he develops any new wounds or rest pain, he would need amputation.  No indication for that currently.   -leukocytosis of 33k up from 20k.  Pt did have low grade fever.  Blood cx were positive for MRSA.   May need embolectomy of his right arm FA loop graft   Leontine Locket, PA-C [facility] [phone] 08/31/2022 7:47 AM   VASCULAR STAFF ADDENDUM:  I have independently interviewed and examined the patient. I agree with the above.  Overall, he appears to be doing well postsurgery, however I would expect him to have an improved mental status, especially on CRRT.  Patient has a history of drinking, complexion with some jaundice, I wonder if there is a component of cirrhosis. Could consider ammonia, hepatic enzymes.  Regarding  his recent blood cultures that were positive, I do not have a good etiology.   There were no signs or symptoms of infection at the left groin site, however bleeds can happen with infection.  The timing however is more likely a technical complication than infectious. Agree with broad-spectrum antibiotics. TTE pending, patient may need TEE to look for valvular vegetations  I had a long discussion with Divine's daughter at bedside regarding the above.  I do not think that taking back to the OR for explant of bovine pericardial patch would be in his best interest at this time, as the repair would be high risk and difficult.   She is aware that I cannot promise that the patch may be infected, and that another bleeding event may occur, however being that is bovine pericardium, there is a lower infectious rate then with other  patches such as Dacron.Furthermore we discussed that there were no physical exam findings concerning for infection. No signs or symptoms of infection at the left groin incision today. Single exam unchanged in the left leg, foot appears viable for the time being. Vein mapping as precautionary measure. Further surgery necessary for the left groin, this would likely result in limb loss.  Both Niklas and his daughter are aware of this.    Melene Muller, MD [facility] Phone Number: [phone] 08/31/2022 1:59 PM

## 2022-08-31 NOTE — Progress Notes (Signed)
67 yo male brought to ER with abdominal pain, nausea, diaphoresis and unable to feel his Lt foot. Hypotensive with lactic acidosis in ER. 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 Hx of ESRD and had hyperkalemia. Had emergent CRRT.  TOC following.

## 2022-08-31 NOTE — Progress Notes (Signed)
VASCULAR LAB    Duplex scan of right upper extremity dialysis graft has been performed.  See CV proc for preliminary results.   Claudean Leavelle, RVT 08/31/2022, 4:13 PM

## 2022-08-31 NOTE — Progress Notes (Signed)
Per nephrology attempt to pull -50 to -100 on CRRT with blood pressure being higher and off pressors.

## 2022-08-31 NOTE — Consult Note (Signed)
WOC Nurse Consult Note: Reason for Consult: DTI sacrum  Wound type: Deep Tissue Pressure Injury, Stage 2 Pressure Injury  Pressure Injury POA: yes  Measurement: 1.  Total area 15 cms x 7 cms dark maroon purple discoloration covering sacrum, coccyx and bilateral buttocks 2.  Stage 2 Coccyx 1 cm x 0.5 cms x 0.1 cm 100% pink and moist   Drainage (amount, consistency, odor)  minimal serosanguinous  Periwound: intact  Dressing procedure/placement/frequency: Clean sacrum, buttocks and coccyx with no rinse cleanser or soap and water,  dry and apply a single layer Xeroform gauze Kellie Simmering [phone]) to entire area.  Cover with sacral foam.  May lift sacral foam to replace Xeroform daily.  Change foam dressing q3 days and prn soiling.   Patient should remain on low air loss mattress throughout hospitalization for pressure redistribution and moisture management.  Also pressure redistribution chair pad Kellie Simmering [phone]) for when up in chair.    POC discussed with bedside nurse.  WOC will not follow this patient.  Re-consult if further needs arise.   Thank you,    Shelton Silvas MSN, RN-BC, [employer] [phone]

## 2022-08-31 NOTE — Progress Notes (Signed)
ANTICOAGULATION CONSULT NOTE Pharmacy Consult for Heparin Indication:  lower extremity limb ischemia  Allergies  Allergen Reactions   Benadryl [Diphenhydramine] Rash   Lidocaine-Prilocaine Rash    Caused red marks up and down arm    Patient Measurements: Height: 5\' 8"  (172.7 cm) Weight: 65.5 kg (144 lb 6.4 oz) IBW/kg (Calculated) : 68.4 Heparin Dosing Weight: 64.4 kg  Vital Signs: Temp: 98.5 F (36.9 C) (03/25 1600) Temp Source: Oral (03/25 1600) Pulse Rate: 77 (03/25 1600)  Labs: Recent Labs    08/29/22 0210 08/29/22 0431 08/30/22 0411 08/30/22 0935 08/30/22 1559 08/30/22 1819 08/30/22 1950 08/31/22 0330 08/31/22 1600  HGB 9.9*   < > 8.9*  --   --  8.5*  --  7.7*  --   HCT 28.8*   < > 26.9*  --   --  25.0*  --  23.4*  --   PLT 221  --  213  --   --   --   --  182  --   HEPARINUNFRC  --    < >  --    < >  --   --  0.21* 0.30 0.20*  CREATININE  --    < > 5.05*  --  3.61*  --   --  3.06* 2.35*   < > = values in this interval not displayed.    Assessment: 67 yr old male with hx LLE thromboembolectomy s/p left groin pseudoaneurysm repair and hematoma evacuation for heparin.   Heparin level came back subtherapeutic at 0.2, on heparin 1450 units/hr. No s/sx of bleeding - hematoma remains unchanged. No infusion issues. Remains on CRRT.   Goal of Therapy:  Heparin level 0.3-0.5 units/ml  Monitor platelets by anticoagulation protocol: Yes   Plan:  Increase heparin infusion to 1550 units/hr Order heparin level in 8 hours  Check heparin level daily, CBC daily   Antonietta Jewel, PharmD, [place] Clinical Pharmacist  Phone: [phone] 08/31/2022 5:02 PM  Please check AMION for all [place] phone numbers After 10:00 PM, call [place] [phone]

## 2022-08-31 NOTE — Procedures (Signed)
I saw and evaluated the patient on CRRT.  I reviewed the last 24 hours events.  Adjustments to CRRT prescription are made as needed. Increasing UF today   Filed Weights   08/29/22 0452 08/30/22 0415 08/31/22 0400  Weight: 67.2 kg 65.9 kg 65.5 kg    Recent Labs  Lab 08/31/22 0330  NA 134*  K 4.9  CL 101  CO2 24  GLUCOSE 104*  BUN 31*  CREATININE 3.06*  CALCIUM 7.8*  PHOS 3.4    Recent Labs  Lab 08/28/22 1810 08/28/22 2224 08/29/22 0210 08/29/22 0431 08/30/22 0411 08/30/22 1819 08/31/22 0330  WBC 19.2*  --  19.5*  --  19.3*  --  33.4*  NEUTROABS 16.6*  --   --   --   --   --   --   HGB 8.7*   < > 9.9*   < > 8.9* 8.5* 7.7*  HCT 27.2*   < > 28.8*   < > 26.9* 25.0* 23.4*  MCV 107.5*  --  91.7  --  93.7  --  97.5  PLT 329  --  221  --  213  --  182   < > = values in this interval not displayed.    Scheduled Meds:  Chlorhexidine Gluconate Cloth  6 each Topical Daily   multivitamin  1 tablet Per Tube QHS   pantoprazole (PROTONIX) IV  40 mg Intravenous Q24H   sodium chloride flush  10-40 mL Intracatheter Q12H   Continuous Infusions:  sodium chloride     sodium chloride     heparin Stopped (08/31/22 0859)   norepinephrine (LEVOPHED) Adult infusion Stopped (08/31/22 0321)   prismasol BGK 2/2.5 replacement solution 400 mL/hr at 08/30/22 2048   prismasol BGK 2/2.5 replacement solution 400 mL/hr at 08/30/22 2048   prismasol BGK 4/2.5 1,000 mL/hr at 08/31/22 0857   vancomycin     PRN Meds:.Place/Maintain arterial line **AND** sodium chloride, acetaminophen **OR** acetaminophen, docusate sodium, fentaNYL (SUBLIMAZE) injection, heparin, ondansetron (ZOFRAN) IV, mouth rinse, polyethylene glycol, sodium chloride, sodium chloride flush   Santiago Bumpers,  MD 08/31/2022, 9:10 AM

## 2022-08-31 NOTE — Progress Notes (Signed)
Per nephrology, turn off CRRT when filter clots or filter life time is up. Will pass along to night shift RN.

## 2022-08-31 NOTE — Consult Note (Signed)
[facility]    Date of Admission:  08/28/2022   Total days of inpatient antibiotics 2        Reason for Consult: MRSA bacteremia    Principal Problem:   Lactic acidosis Active Problems:   ESRD (end stage renal disease) on dialysis [facility])   Assessment: 67 year old male with past medical history of PAD, ESRD HD, recently hospitalized 3/16-18 for acute on chronic left lower extremity ischemia.  He had undergone femoral endarterectomy with bovine pericardial patch, left iliofemoral profundofemoral thromboembolectomy, stent of the left common and external iliac arteries 3/16 with Dr. Donzetta Matters, vascular surgery admitted with left femoral pseudoaneurysm found to have MRSA bacteremia  #MRSA bacteremia likely secondary to left femoral wound postop infection - On 3/22 CT angio showed 1.1 cm left common femoral artery pseudoaneurysm, new.  New anterior pelvic extraperitoneal hematoma. -OR with Dr. Virl Cagey for left groin exploration, hematoma evacuation on 3/23. OR findings showed dehiscence at proximal aspect of bovine pericardial patch at the suture line. -Per OR note on 3/16 there is a stent in the left common and external iliac arteries.  The stents are left in place, patient will need to be on suppressive antibiotics following completion of likely 4-6 weeks of IV therapy. Recommendations: - Continue vancomycin  - F/U TTE, will need TEE - Repeat blood Cx to ensure clearance - Remove lines(RIJ placed on 3/22->bkiid Cx+ 3/23)  #Right IJ in place for CRRT on 3/22 #History of ESRD on intermittent HD via right AV fistula - Fistula was clotted. - Right ankle J was placed - Patient is getting CRRT right now, would recommend line removal when able   #Right LQ pain -He is slighly tender. Re-imaging if right LQ, suprapubic pain worsens.  Microbiology:   Antibiotics: Vancomycin 3/24-  Cultures: Blood 3/22 2/2 MRSA    HPI: Albert Harris is a 67 y.o. male  with history of ESRD right upper extremity AV fistula, PAD, COPD, hypertension, hyperlipidemia, heart failure with reduced ejection fraction, PAD admitted with left common femoral artery pseudoaneurysm.  He had presented to the ED with abdominal pain, nausea, diaphoresis and unable to feel the left foot.  Found to be hypertensive.  Left lower leg with pulseless found to have left femoral pseudoaneurysm taken to the OR by vascular surgery.  On 3/23 patient underwent left exploration, hematoma evacuation, primary repair of distal external iliac artery with VAC placement with Dr. Virl Cagey.  OR findings showed dehiscence at proximal aspect of bovine pericardial patch at the suture line.   Review of Systems: Review of Systems  All other systems reviewed and are negative.   Past Medical History:  Diagnosis Date   Acute metabolic encephalopathy [id number]   Acute on chronic diastolic CHF (congestive heart failure) ([place]) 12/15/2020   Acute respiratory disease due to COVID-19 virus 06/16/2020   Alcohol use disorder 06/30/2022   Anemia    Asthma    Bacteremia due to Gram-positive bacteria 05/09/2021   Carotid stenosis 09/12/2019   CHF (congestive heart failure) (HCC)    Claudication in peripheral vascular disease ([place]) [id number]   Complication of anesthesia    " i HAVE A HARD TIME WAKING UP " (only one time)   COPD (chronic obstructive pulmonary disease) ([place])    COVID-19    positive on  06/16/20   Critical limb ischemia of right lower extremity ([place])    ESRD on hemodialysis ([place])    t, th. sat dialysis  GERD (gastroesophageal reflux disease)    HLD (hyperlipidemia)    Hypertension    Hypertensive urgency 12/24/2016   Lower GI bleed    Nausea vomiting and diarrhea 08/13/2022   Peripheral vascular disease (HCC)    PFO with atrial septal aneurysm    by echo 11/2017   Pneumonia    Stroke [facility])    Surgical wound infection 09/20/2021   [name] (transient ischemic attack) 11/2017   Tobacco abuse     quit 07/17/19   Volume overload 12/14/2020    Social History   Tobacco Use   Smoking status: Former    Packs/day: 1.00    Years: 50.00    Additional pack years: 0.00    Total pack years: 50.00    Types: Cigarettes    Quit date: 07/17/2019    Years since quitting: 3.1   Smokeless tobacco: Never  Vaping Use   Vaping Use: Never used  Substance Use Topics   Alcohol use: Not Currently    Alcohol/week: 1.0 - 3.0 standard drink of alcohol    Types: 1 - 3 Cans of beer per week   Drug use: No    Family History  Problem Relation Age of Onset   Hypertension Mother    Colon cancer Father    Stomach cancer Brother    Scheduled Meds:  Chlorhexidine Gluconate Cloth  6 each Topical Daily   multivitamin  1 tablet Per Tube QHS   pantoprazole (PROTONIX) IV  40 mg Intravenous Q24H   sodium chloride flush  10-40 mL Intracatheter Q12H   Continuous Infusions:  sodium chloride     sodium chloride     heparin 1,450 Units/hr (08/31/22 1200)   norepinephrine (LEVOPHED) Adult infusion Stopped (08/31/22 0321)   prismasol BGK 2/2.5 replacement solution 400 mL/hr at 08/31/22 0934   prismasol BGK 2/2.5 replacement solution 400 mL/hr at 08/31/22 0933   prismasol BGK 4/2.5 1,000 mL/hr at 08/31/22 0857   vancomycin     PRN Meds:.Place/Maintain arterial line **AND** sodium chloride, acetaminophen **OR** acetaminophen, docusate sodium, fentaNYL (SUBLIMAZE) injection, heparin, ondansetron (ZOFRAN) IV, mouth rinse, polyethylene glycol, sodium chloride, sodium chloride flush Allergies  Allergen Reactions   Benadryl [Diphenhydramine] Rash   Lidocaine-Prilocaine Rash    Caused red marks up and down arm    OBJECTIVE: Blood pressure (!) 115/46, pulse 80, temperature 97.9 F (36.6 C), temperature source Oral, resp. rate (!) 28, height 5\' 8"  (1.727 m), weight 65.5 kg, SpO2 98 %.  Physical Exam Constitutional:      General: He is not in acute distress.    Appearance: He is normal weight. He is not  toxic-appearing.  HENT:     Head: Normocephalic and atraumatic.     Right Ear: External ear normal.     Left Ear: External ear normal.     Nose: No congestion or rhinorrhea.     Mouth/Throat:     Mouth: Mucous membranes are moist.     Pharynx: Oropharynx is clear.  Eyes:     Extraocular Movements: Extraocular movements intact.     Conjunctiva/sclera: Conjunctivae normal.     Pupils: Pupils are equal, round, and reactive to light.  Cardiovascular:     Rate and Rhythm: Normal rate and regular rhythm.     Heart sounds: No murmur heard.    No friction rub. No gallop.  Pulmonary:     Effort: Pulmonary effort is normal.     Breath sounds: Normal breath sounds.  Abdominal:     General:  Abdomen is flat. Bowel sounds are normal.     Palpations: Abdomen is soft.  Musculoskeletal:        General: No swelling. Normal range of motion.     Cervical back: Normal range of motion and neck supple.  Skin:    General: Skin is warm and dry.     Comments: Left groin wound vac  Neurological:     General: No focal deficit present.     Mental Status: He is oriented to person, place, and time.  Psychiatric:        Mood and Affect: Mood normal.     Lab Results Lab Results  Component Value Date   WBC 33.4 (H) 08/31/2022   HGB 7.7 (L) 08/31/2022   HCT 23.4 (L) 08/31/2022   MCV 97.5 08/31/2022   PLT 182 08/31/2022    Lab Results  Component Value Date   CREATININE 3.06 (H) 08/31/2022   BUN 31 (H) 08/31/2022   NA 134 (L) 08/31/2022   K 4.9 08/31/2022   CL 101 08/31/2022   CO2 24 08/31/2022    Lab Results  Component Value Date   ALT <5 08/28/2022   AST 21 08/28/2022   ALKPHOS 122 08/28/2022   BILITOT 1.2 08/28/2022       Laurice Record, [facility]ious [facility] 08/31/2022, 12:33 PM

## 2022-08-31 NOTE — Progress Notes (Signed)
Pharmacy Antibiotic Note  Albert Harris is a 67 y.o. 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 Now with MRSA bacteremia. Noted patient is ESRD with a right arm AV graft and a catheter in his right IJ. Pharmacy has been consulted for vancomycin dosing. Patient is currently on CRRT and received a load of 1500 mg vancomycin last night ~ 1900.   Plan: Vancomycin 750 mg every 24 hours while patient on CRRT  Evaluate levels as appropriate  Monitor dialysis plans, blood cultures, echo, clinical progression  Height: 5\' 8"  (172.7 cm) Weight: 65.5 kg (144 lb 6.4 oz) IBW/kg (Calculated) : 68.4  Temp (24hrs), Avg:99.5 F (37.5 C), Min:98.6 F (37 C), Max:100.3 F (37.9 C)  Recent Labs  Lab 08/28/22 1810 08/28/22 1811 08/28/22 2050 08/29/22 0210 08/29/22 0434 08/29/22 0500 08/29/22 0759 08/29/22 1120 08/29/22 1510 08/30/22 0411 08/30/22 1559 08/31/22 0330  WBC 19.2*  --   --  19.5*  --   --   --   --   --  19.3*  --  33.4*  CREATININE 15.92*  --   --   --   --  12.31*  --   --  11.04* 5.05* 3.61* 3.06*  LATICACIDVEN  --    < > >9.0* 6.0* 3.8*  --  1.9 1.5  --   --   --   --    < > = values in this interval not displayed.    Estimated Creatinine Clearance: 21.4 mL/min (A) (by C-G formula based on SCr of 3.06 mg/dL (H)).    Allergies  Allergen Reactions   Benadryl [Diphenhydramine] Rash   Lidocaine-Prilocaine Rash    Caused red marks up and down arm      Thank you for allowing pharmacy to be a part of this patient's care.  Jimmy Footman, PharmD, BCPS, BCIDP Infectious Diseases Clinical Pharmacist Phone: [phone] 08/31/2022 8:22 AM

## 2022-08-31 NOTE — Evaluation (Addendum)
Physical Therapy Evaluation Patient Details Name: Albert Harris MRN: [id number] DOB: August 04, 1955 Today's Date: 08/31/2022  History of Present Illness  67 yo male brought to ER 3/22 with abdominal pain, nausea, diaphoresis and unable to feel his Lt foot.  Hypotensive with lactic acidosis in ER.  Pulseless Lt lower leg and found to have Lt femoral pseudoaneurysm with repair on 3/23.  Had emergent CRRT.   CT revealed right basal ganglia infarct. [id number] and had hyperkalemia.  Clinical Impression  Pt admitted with above diagnosis. Pt was able to tolerate sitting EOB for up to 8 minutes with max to total assist due to poor postural control and poor endurance. Pt is significantly weaker than PTA per daughter and will need therapy prior to going home. Feel pt can tolerate up to 2 hours therapy.  Will follow acutely.  Pt currently with functional limitations due to the deficits listed below (see PT Problem List). Pt will benefit from acute skilled PT to increase their independence and safety with mobility to allow discharge.          Recommendations for follow up therapy are one component of a multi-disciplinary discharge planning process, led by the attending physician.  Recommendations may be updated based on patient status, additional functional criteria and insurance authorization.  Follow Up Recommendations Can patient physically be transported by private vehicle: No     Assistance Recommended at Discharge Frequent or constant Supervision/Assistance  Patient can return home with the following  Assist for transportation;Two people to help with walking and/or transfers;Two people to help with bathing/dressing/bathroom;Assistance with cooking/housework;Help with stairs or ramp for entrance    Equipment Recommendations Other (comment) (TBA)  Recommendations for Other Services       Functional Status Assessment Patient has had a recent decline in their functional status and demonstrates the  ability to make significant improvements in function in a reasonable and predictable amount of time.     Precautions / Restrictions Precautions Precautions: Fall Precaution Comments: VAC left groin, CRRT Restrictions Weight Bearing Restrictions: No      Mobility  Bed Mobility Overal bed mobility: Needs Assistance Bed Mobility: Supine to Sit, Sit to Supine     Supine to sit: Total assist, +2 for physical assistance, HOB elevated Sit to supine: Total assist, +2 for physical assistance   General bed mobility comments: Pt needed total assist with pt performing 10% of initiation of movement to EOB.  Needed assist at LEs and trunk.    Transfers Overall transfer level: Needs assistance Equipment used: 2 person hand held assist Transfers: Sit to/from Stand Sit to Stand: +2 physical assistance, From elevated surface, Total assist           General transfer comment: Pt needed bil UE support and knees blocked for sit to stand with pt unable to stand fully upright. Pt only cleared buttocks partially off bed about 4 inches with therapists assisting quite a bit.    Ambulation/Gait                  Stairs            Wheelchair Mobility    Modified Rankin (Stroke Patients Only)       Balance Overall balance assessment: Mild deficits observed, not formally tested Sitting-balance support: No upper extremity supported, Feet supported, Bilateral upper extremity supported Sitting balance-Leahy Scale: Zero Sitting balance - Comments: Pt could not sit EOB without assist.  Pt needed mod to max assist to sit EOb with pt fatiguing  rather quickly. Pt with forward flexed posture much of the time needing cues to look up and sit up.   Standing balance support: Bilateral upper extremity supported, During functional activity Standing balance-Leahy Scale: Zero Standing balance comment: Pt attempted to stand however could barely clear buttocks off bed even with total assist of 2.                              Pertinent Vitals/Pain Pain Assessment Pain Assessment: Faces Faces Pain Scale: Hurts even more Breathing: normal Negative Vocalization: none Facial Expression: smiling or inexpressive Body Language: relaxed Consolability: no need to console PAINAD Score: 0 Pain Location: left thigh Pain Descriptors / Indicators: Aching Pain Intervention(s): Limited activity within patient's tolerance, Monitored during session, Repositioned    Home Living Family/patient expects to be discharged to:: Private residence Living Arrangements: Spouse/significant other Available Help at Discharge: Family;Available 24 hours/day (wife is there but cant assist) Type of Home: House Home Access: Stairs to enter Entrance Stairs-Rails: Right Entrance Stairs-Number of Steps: 5   Home Layout: Two level;Able to live on main level with bedroom/bathroom Home Equipment: Wheelchair - manual;Hand held shower head;Standard Environmental consultant Additional Comments: Daughter gave pts info as she was present in room.    Prior Function Prior Level of Function : Independent/Modified Independent             Mobility Comments: ambulates without AD, denies falls. ADLs Comments: independent ADLs, limited IADLs, spouse drives and helps pt with socks. Wife assists with dishwashing     Hand Dominance   Dominant Hand: Right    Extremity/Trunk Assessment   Upper Extremity Assessment Upper Extremity Assessment: Defer to OT evaluation    Lower Extremity Assessment RLE Deficits / Details: grossly 4/5 to MMT RLE Sensation: history of peripheral neuropathy RLE Coordination: WNL LLE Deficits / Details: limited by pain, 2/5, minimal movement at ankle and hip, is able to move toes. LLE: Unable to fully assess due to pain LLE Sensation: decreased light touch    Cervical / Trunk Assessment Cervical / Trunk Assessment: Kyphotic  Communication   Communication: HOH  Cognition Arousal/Alertness:  Awake/alert Behavior During Therapy: WFL for tasks assessed/performed Overall Cognitive Status: Within Functional Limits for tasks assessed Area of Impairment: Memory, Orientation, Following commands, Safety/judgement, Awareness, Problem solving                 Orientation Level: Disoriented to, Time, Situation (month)   Memory: Decreased short-term memory Following Commands: Follows one step commands consistently, Follows one step commands with increased time Safety/Judgement: Decreased awareness of safety, Decreased awareness of deficits Awareness: Emergent, Intellectual Problem Solving: Slow processing, Difficulty sequencing, Requires tactile cues General Comments: Pt did need cues for safety.Needs repetition of commands but can follow commands with incr time        General Comments General comments (skin integrity, edema, etc.): 88 bpm, 93% 4LO2, 179/53    Exercises General Exercises - Lower Extremity Ankle Circles/Pumps: AAROM, PROM, Both, 5 reps, Supine Quad Sets: AAROM, Both, 5 reps, Supine Gluteal Sets: AAROM, Both, 5 reps, Supine Long Arc Quad: Both, Seated, AAROM (left weaker than right) Heel Slides: AAROM, Both, 5 reps, Supine   Assessment/Plan    PT Assessment Patient needs continued PT services  PT Problem List Decreased strength;Decreased activity tolerance;Decreased mobility;Decreased balance;Decreased knowledge of precautions;Cardiopulmonary status limiting activity;Decreased knowledge of use of DME;Decreased safety awareness       PT Treatment Interventions DME instruction;Gait training;Functional mobility training;Stair  training;Therapeutic activities;Therapeutic exercise;Balance training;Neuromuscular re-education;Patient/family education    PT Goals (Current goals can be found in the Care Plan section)  Acute Rehab PT Goals Patient Stated Goal: Pt wants to get better and go home. PT Goal Formulation: With patient/family Time For Goal Achievement:  09/14/22 Potential to Achieve Goals: Good    Frequency Min 3X/week     Co-evaluation               AM-PAC PT "6 Clicks" Mobility  Outcome Measure Help needed turning from your back to your side while in a flat bed without using bedrails?: Total Help needed moving from lying on your back to sitting on the side of a flat bed without using bedrails?: Total Help needed moving to and from a bed to a chair (including a wheelchair)?: Total Help needed standing up from a chair using your arms (e.g., wheelchair or bedside chair)?: Total Help needed to walk in hospital room?: Total Help needed climbing 3-5 steps with a railing? : Total 6 Click Score: 6    End of Session Equipment Utilized During Treatment: Gait belt Activity Tolerance: Patient limited by fatigue Patient left: in bed;with call bell/phone within reach;Other (comment);with family/visitor present (in chair position) Nurse Communication: Mobility status;Need for lift equipment PT Visit Diagnosis: Other abnormalities of gait and mobility (R26.89);Muscle weakness (generalized) (M62.81);Difficulty in walking, not elsewhere classified (R26.2)    Time: [id number] PT Time Calculation (min) (ACUTE ONLY): 40 min   Charges:   PT Evaluation $PT Eval Moderate Complexity: 1 Mod PT Treatments $Therapeutic Exercise: 8-22 mins $Therapeutic Activity: 8-22 mins        [facility] M,PT Acute Rehab Services [phone]   Albert Harris 08/31/2022, 1:10 PM

## 2022-08-31 NOTE — Progress Notes (Signed)
VASCULAR LAB    Lower extremity saphenous vein mapping has been performed.  See CV proc for preliminary results.   Dalton Mille, RVT 08/31/2022, 4:13 PM

## 2022-08-31 NOTE — Progress Notes (Signed)
NAME:  Albert Harris, MRN:  [id number], DOB:  July 12, 1955, LOS: 3 ADMISSION DATE:  08/28/2022, CONSULTATION DATE:  08/28/22 REFERRING MD: Jeannine Kitten , CHIEF COMPLAINT:  Abdominal pain  History of Present Illness:   67 yo male brought to ER with abdominal pain, nausea, diaphoresis and unable to feel his Lt foot.  Hypotensive with lactic acidosis in ER.  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  Hx of ESRD and had hyperkalemia.  Had emergent CRRT.  Vascular consulted and taken to OR.    Pertinent  Medical History  ESRD -AV fistula (no flow tonight), PAD, COPD, HLD, HTN, HFrEF 35-40%, PAD  Significant Hospital Events: Including procedures, antibiotic start and stop dates in addition to other pertinent events   3/22 Admit, emergent CRRT, to OR for repair of distal Lt external iliac artery 3/24 Extubated  Interim History / Subjective:   Extubated yesterday.  CT head obtained for altered mental status did not show any acute abnormalities Off pressors.  Objective   Blood pressure (!) 115/46, pulse 83, temperature 98.6 F (37 C), resp. rate (!) 27, height 5\' 8"  (1.727 m), weight 65.5 kg, SpO2 100 %.        Intake/Output Summary (Last 24 hours) at 08/31/2022 0924 Last data filed at 08/31/2022 0900 Gross per 24 hour  Intake 631.31 ml  Output 806.5 ml  Net -175.19 ml   Filed Weights   08/29/22 0452 08/30/22 0415 08/31/22 0400  Weight: 67.2 kg 65.9 kg 65.5 kg    Examination: .Blood pressure (!) 115/46, pulse 83, temperature 98.6 F (37 C), resp. rate (!) 27, height 5\' 8"  (1.727 m), weight 65.5 kg, SpO2 100 %. Gen:      No acute distress HEENT:  EOMI, sclera anicteric Neck:     No masses; no thyromegaly Lungs:    Clear to auscultation bilaterally; normal respiratory effort CV:         Regular rate and rhythm; no murmurs Abd:      + bowel sounds; soft, non-tender; no palpable masses, no distension, Small hematoma on the abdominal wall Ext:    No edema; adequate  peripheral perfusion Skin:      Warm and dry; no rash Neuro: alert and oriented x 3 Psych: normal mood and affect   Labs/imaging reviewed Significant for sodium 134, BUN/creatinine 31/3.06 WBC 33.4, hemoglobin 7.7, platelets 182  Resolved Hospital Problem list   Anion gap metabolic acidosis with lactic acidosis, Hyperkalemia  Assessment & Plan:  MRSA blood cultures Started vancomycin Monitor fevers and leukocytosis ID to consult  Lt common femoral artery pseudoaneurysm s/p Lt groin hematoma evacuation, primary repair of distal external iliac artery. Continue heparin drip.  Monitor abdominal wall hematoma.  If it becomes bigger or hemoglobin drops then will need to consider stopping anticoagulation Postop care per vascular surgery  ESRD. Starting volume removal on CRRT  Acute respiratory failure 2nd to acidosis and uremia. Hx of COPD. Stable postextubation.  Follow intermittent chest x-ray  Anemia of critical illness and chronic disease. Follow CBC.  Transfuse for hemoglobin less than 7  Chronic systolic CHF with EF 35 to 40% on Echo from 06/29/22. Hx of pulmonary hypertension. - pressors to keep MAP > 65 - hold outpt toprol, plavix  Acute metabolic encephalopathy from uremia, acidosis. Monitor  Best Practice (right click and "Reselect all SmartList Selections" daily)   Diet/type: tubefeeds DVT prophylaxis: systemic heparin plan for surgery  GI prophylaxis: PPI Lines: Dialysis Catheter and Arterial Line  Foley:  N/A Code Status:  full code Last date of multidisciplinary goals of care discussion [x]   Critical care time:    The patient is critically ill with multiple organ system failure and requires high complexity decision making for assessment and support, frequent evaluation and titration of therapies, advanced monitoring, review of radiographic studies and interpretation of complex data.   Critical Care Time devoted to patient care services, exclusive of  separately billable procedures, described in this note is 35 minutes.   Marshell Garfinkel MD [facility] See Amion for pager  If no response to pager , please call [phone] until 7pm After 7:00 pm call Elink  [phone] 08/31/2022, 9:31 AM

## 2022-08-31 NOTE — Progress Notes (Signed)
  Echocardiogram 2D Echocardiogram has been performed.  Albert Harris 08/31/2022, 2:08 PM

## 2022-08-31 NOTE — Progress Notes (Addendum)
ANTICOAGULATION CONSULT NOTE Pharmacy Consult for Heparin Indication:  lower extremity limb ischemia  Allergies  Allergen Reactions   Benadryl [Diphenhydramine] Rash   Lidocaine-Prilocaine Rash    Caused red marks up and down arm    Patient Measurements: Height: 5\' 8"  (172.7 cm) Weight: 65.5 kg (144 lb 6.4 oz) IBW/kg (Calculated) : 68.4 Heparin Dosing Weight: 64.4 kg  Vital Signs: Temp: 98.6 F (37 C) (03/25 0732) Temp Source: Axillary (03/25 0314) Pulse Rate: 87 (03/25 0800)  Labs: Recent Labs    08/29/22 0210 08/29/22 0431 08/30/22 0411 08/30/22 0935 08/30/22 1559 08/30/22 1819 08/30/22 1950 08/31/22 0330  HGB 9.9*   < > 8.9*  --   --  8.5*  --  7.7*  HCT 28.8*   < > 26.9*  --   --  25.0*  --  23.4*  PLT 221  --  213  --   --   --   --  182  HEPARINUNFRC  --    < >  --  0.20*  --   --  0.21* 0.30  CREATININE  --    < > 5.05*  --  3.61*  --   --  3.06*   < > = values in this interval not displayed.    Assessment: 67 yr old male with hx LLE thromboembolectomy s/p left groin pseudoaneurysm repair and hematoma evacuation for heparin.   Heparin level therapeutic (0.30) - No issues with line or bleeding reported per RN. Remains on CRRT.  Some concern for R groin pain this AM , likely hematoma . Ok to continue heparin for now  Goal of Therapy:  Heparin level 0.3-0.5 units/ml  Monitor platelets by anticoagulation protocol: Yes   Plan:  Continue Heparin to 1450 units/hr Will re-check HL to ensure maintenance of therapeutic levels @1700  Check heparin level daily, CBC daily   Wilson Singer, PharmD Clinical Pharmacist 08/31/2022 8:11 AM

## 2022-08-31 NOTE — Progress Notes (Signed)
Nephrology Follow-Up Consult note   Assessment/Recommendations: Albert Harris is a/an 67 y.o. male with a past medical history significant for ESRD, PAD, CHF, HTN, admitted for critical limb ischemia.       ESRD: TTS and [place] via RUE AVG. Using CRRT for dialysis at this time given the patient's hemodynamic instability.  Given improved hemodynamics likely can make No restart today. Will check on filter life which may influence decision. Can likely take off tomorrow if he has not already stopped  Left common femoral artery pseudoaneurysm: Status post surgery with Dr. Unk Lightning on the morning of 08/29/2022 status post repair.  Continue following  Anion gap metabolic acidosis: Now resolved with renal placement therapy and optimization of hemodynamics  Shock/MRSA Bacteremia: Likely multifactorial with infectious component. MRSA treatment per primary   Anemia: Likely multifactorial.  Transfusions as needed  Metabolic encephalopathy: Likely multifactorial.  Did miss dialysis for a week.  Overall improved but likely not back to baseline.  Dialysis access: AVG seems to be clotted from my assessment. AVG [place] today. Maintain temp cath.  Possible intervention once the patient improves   Recommendations conveyed to primary service.    [name] 08/31/2022 9:06 AM  ___________________________________________________________  CC: Femoral artery pseudoaneurysm  Interval History/Subjective: Extubated yesterday. Some lethargy. CT head without acute abnormality. Stable on CRRT no longer requiring pressors. Bcx + for MRSA. Patient has no complaints today.   Medications:  Current Facility-Administered Medications  Medication Dose Route Frequency Provider Last Rate Last Admin   0.9 %  sodium chloride infusion   Intra-arterial PRN Baglia, Corrina, PA-C       0.9 %  sodium chloride infusion  250 mL Intravenous Continuous Baglia, Corrina, PA-C       acetaminophen  (TYLENOL) tablet 650 mg  650 mg Per Tube Q6H PRN Chesley Mires, MD       Or   acetaminophen (TYLENOL) suppository 650 mg  650 mg Rectal Q4H PRN Chesley Mires, MD       Chlorhexidine Gluconate Cloth 2 % PADS 6 each  6 each Topical Daily Baglia, Corrina, PA-C   6 each at 08/30/22 2100   docusate sodium (COLACE) capsule 100 mg  100 mg Oral BID PRN Baglia, Corrina, PA-C       fentaNYL (SUBLIMAZE) injection 50 mcg  50 mcg Intravenous Q3H PRN Chesley Mires, MD       heparin ADULT infusion 100 units/mL (25000 units/2100mL)  1,450 Units/hr Intravenous Continuous Franky Macho, RPH   Stopped at 08/31/22 0859   heparin injection 1,000-6,000 Units  1,000-6,000 Units CRRT PRN Baglia, Corrina, PA-C   2,400 Units at 08/30/22 1818   multivitamin (RENA-VIT) tablet 1 tablet  1 tablet Per Tube QHS Chesley Mires, MD       norepinephrine (LEVOPHED) 16 mg in 262mL (0.064 mg/mL) premix infusion  0-40 mcg/min Intravenous Titrated Mauri Brooklyn, MD   Stopped at 08/31/22 0321   ondansetron (ZOFRAN) injection 4 mg  4 mg Intravenous Q6H PRN Baglia, Corrina, PA-C       Oral care mouth rinse  15 mL Mouth Rinse PRN Chesley Mires, MD       pantoprazole (PROTONIX) injection 40 mg  40 mg Intravenous Q24H Chesley Mires, MD   40 mg at 08/31/22 0851   polyethylene glycol (MIRALAX / GLYCOLAX) packet 17 g  17 g Oral Daily PRN Baglia, Corrina, PA-C       prismasol BGK 2/2.5 replacement solution   CRRT Continuous Roney Jaffe, MD  400 mL/hr at 08/30/22 2048 New Bag at 08/30/22 2048   prismasol BGK 2/2.5 replacement solution   CRRT Continuous Roney Jaffe, MD 400 mL/hr at 08/30/22 2048 New Bag at 08/30/22 2048   prismasol BGK 4/2.5 infusion   CRRT Continuous Reesa Chew, MD 1,000 mL/hr at 08/31/22 0857 New Bag at 08/31/22 0857   sodium chloride 0.9 % primer fluid for CRRT   CRRT PRN Karoline Caldwell, PA-C   Given at 08/29/22 0335   sodium chloride flush (NS) 0.9 % injection 10-40 mL  10-40 mL Intracatheter Q12H Baglia, Corrina, PA-C    10 mL at 08/31/22 0851   sodium chloride flush (NS) 0.9 % injection 10-40 mL  10-40 mL Intracatheter PRN Baglia, Corrina, PA-C       vancomycin (VANCOREADY) IVPB 750 mg/150 mL  750 mg Intravenous Q24H Susa Raring, [facility]          Review of Systems: 10 systems reviewed and negative except per HPI  Physical Exam: Vitals:   08/31/22 0732 08/31/22 0800  BP:    Pulse:  87  Resp:  (!) 23  Temp: 98.6 F (37 C)   SpO2:  95%   Total I/O In: 28.8 [I.V.:28.8] Out: 56.7   Intake/Output Summary (Last 24 hours) at 08/31/2022 [id number] Last data filed at 08/31/2022 0900 Gross per 24 hour  Intake 631.31 ml  Output 806.5 ml  Net -175.19 ml   Constitutional: lying in bed, nad ENMT: ears and nose without scars or lesions, MMM CV: normal rate, no edema Respiratory: bilateral chest rise, no iwob Gastrointestinal: soft, nonended Skin: no visible lesions or rashes Psych: mood and affect appropriate  Access: Right upper extremity AVG, no bruit or thrill   Test Results I personally reviewed new and old clinical labs and radiology tests Lab Results  Component Value Date   NA 134 (L) 08/31/2022   K 4.9 08/31/2022   CL 101 08/31/2022   CO2 24 08/31/2022   BUN 31 (H) 08/31/2022   CREATININE 3.06 (H) 08/31/2022   GFR 58.30 (L) 01/19/2017   CALCIUM 7.8 (L) 08/31/2022   ALBUMIN 1.9 (L) 08/31/2022   PHOS 3.4 08/31/2022    CBC Recent Labs  Lab 08/28/22 1810 08/28/22 2224 08/29/22 0210 08/29/22 0431 08/30/22 0411 08/30/22 1819 08/31/22 0330  WBC 19.2*  --  19.5*  --  19.3*  --  33.4*  NEUTROABS 16.6*  --   --   --   --   --   --   HGB 8.7*   < > 9.9*   < > 8.9* 8.5* 7.7*  HCT 27.2*   < > 28.8*   < > 26.9* 25.0* 23.4*  MCV 107.5*  --  91.7  --  93.7  --  97.5  PLT 329  --  221  --  213  --  182   < > = values in this interval not displayed.

## 2022-09-01 DIAGNOSIS — E440 Moderate protein-calorie malnutrition: Secondary | ICD-10-CM | POA: Insufficient documentation

## 2022-09-01 DIAGNOSIS — E872 Acidosis, unspecified: Secondary | ICD-10-CM | POA: Diagnosis not present

## 2022-09-01 LAB — BPAM RBC
Blood Product Expiration Date: 202404172359
Blood Product Expiration Date: 202404182359
Blood Product Expiration Date: 202404182359
Blood Product Expiration Date: 202404182359
ISSUE DATE / TIME: 202403230052
ISSUE DATE / TIME: 202403230052
Unit Type and Rh: 5100
Unit Type and Rh: 5100
Unit Type and Rh: 5100
Unit Type and Rh: 5100

## 2022-09-01 LAB — POCT I-STAT 7, (LYTES, BLD GAS, ICA,H+H)
Acid-Base Excess: 0 mmol/L (ref 0.0–2.0)
Bicarbonate: 24.1 mmol/L (ref 20.0–28.0)
Calcium, Ion: 1.18 mmol/L (ref 1.15–1.40)
HCT: 23 % — ABNORMAL LOW (ref 39.0–52.0)
Hemoglobin: 7.8 g/dL — ABNORMAL LOW (ref 13.0–17.0)
O2 Saturation: 93 %
Patient temperature: 98.44
Potassium: 4.6 mmol/L (ref 3.5–5.1)
Sodium: 136 mmol/L (ref 135–145)
TCO2: 25 mmol/L (ref 22–32)
pCO2 arterial: 34.3 mmHg (ref 32–48)
pH, Arterial: 7.455 — ABNORMAL HIGH (ref 7.35–7.45)
pO2, Arterial: 64 mmHg — ABNORMAL LOW (ref 83–108)

## 2022-09-01 LAB — RENAL FUNCTION PANEL
Albumin: 1.9 g/dL — ABNORMAL LOW (ref 3.5–5.0)
Albumin: 1.9 g/dL — ABNORMAL LOW (ref 3.5–5.0)
Anion gap: 15 (ref 5–15)
Anion gap: 12 (ref 5–15)
BUN: 25 mg/dL — ABNORMAL HIGH (ref 8–23)
BUN: 43 mg/dL — ABNORMAL HIGH (ref 8–23)
CO2: 23 mmol/L (ref 22–32)
CO2: 22 mmol/L (ref 22–32)
Calcium: 8.4 mg/dL — ABNORMAL LOW (ref 8.9–10.3)
Calcium: 8.3 mg/dL — ABNORMAL LOW (ref 8.9–10.3)
Chloride: 97 mmol/L — ABNORMAL LOW (ref 98–111)
Chloride: 100 mmol/L (ref 98–111)
Creatinine, Ser: 2.21 mg/dL — ABNORMAL HIGH (ref 0.61–1.24)
Creatinine, Ser: 3.19 mg/dL — ABNORMAL HIGH (ref 0.61–1.24)
GFR, Estimated: 32 mL/min — ABNORMAL LOW (ref >60)
GFR, Estimated: 20 mL/min — ABNORMAL LOW (ref >60)
Glucose, Bld: 96 mg/dL (ref 70–99)
Glucose, Bld: 127 mg/dL — ABNORMAL HIGH (ref 70–99)
Phosphorus: 3 mg/dL (ref 2.5–4.6)
Phosphorus: 3.4 mg/dL (ref 2.5–4.6)
Potassium: 4.5 mmol/L (ref 3.5–5.1)
Potassium: 3.9 mmol/L (ref 3.5–5.1)
Sodium: 135 mmol/L (ref 135–145)
Sodium: 134 mmol/L — ABNORMAL LOW (ref 135–145)

## 2022-09-01 LAB — TYPE AND SCREEN
ABO/RH(D): O POS
Antibody Screen: NEGATIVE
Unit division: 0
Unit division: 0
Unit division: 0
Unit division: 0

## 2022-09-01 LAB — GLUCOSE, CAPILLARY
Glucose-Capillary: 92 mg/dL (ref 70–99)
Glucose-Capillary: 81 mg/dL (ref 70–99)

## 2022-09-01 LAB — AMMONIA: Ammonia: <10 umol/L (ref 9–35)

## 2022-09-01 LAB — CBC
HCT: 23.7 % — ABNORMAL LOW (ref 39.0–52.0)
Hemoglobin: 7.5 g/dL — ABNORMAL LOW (ref 13.0–17.0)
MCH: 31 pg (ref 26.0–34.0)
MCHC: 31.6 g/dL (ref 30.0–36.0)
MCV: 97.9 fL (ref 80.0–100.0)
Platelets: 182 10*3/uL (ref 150–400)
RBC: 2.42 MIL/uL — ABNORMAL LOW (ref 4.22–5.81)
RDW: 19.5 % — ABNORMAL HIGH (ref 11.5–15.5)
WBC: 29.5 10*3/uL — ABNORMAL HIGH (ref 4.0–10.5)
nRBC: 0 % (ref 0.0–0.2)

## 2022-09-01 LAB — CULTURE, BLOOD (ROUTINE X 2): Special Requests: ADEQUATE

## 2022-09-01 LAB — MAGNESIUM: Magnesium: 2.7 mg/dL — ABNORMAL HIGH (ref 1.7–2.4)

## 2022-09-01 LAB — HEPARIN LEVEL (UNFRACTIONATED)
Heparin Unfractionated: 0.13 [IU]/mL — ABNORMAL LOW (ref 0.30–0.70)
Heparin Unfractionated: 0.12 IU/mL — ABNORMAL LOW (ref 0.30–0.70)
Heparin Unfractionated: 0.19 IU/mL — ABNORMAL LOW (ref 0.30–0.70)

## 2022-09-01 LAB — HEPATITIS B SURFACE ANTIGEN: Hepatitis B Surface Ag: NONREACTIVE

## 2022-09-01 MED ORDER — ACETAMINOPHEN 650 MG RE SUPP: 650.0000 mg | RECTAL | Status: DC | PRN

## 2022-09-01 MED ORDER — HYDROMORPHONE HCL 1 MG/ML IJ SOLN
0.5000 mg | INTRAMUSCULAR | Status: DC | PRN
  Administered 2022-09-01 – 2022-09-03 (×9): 0.5 mg via INTRAVENOUS
  Filled 2022-09-01 (×9): qty 0.5

## 2022-09-01 MED ORDER — DARBEPOETIN ALFA 100 MCG/0.5ML IJ SOSY
100.0000 ug | PREFILLED_SYRINGE | INTRAMUSCULAR | Status: DC
  Administered 2022-09-02: 100 ug via SUBCUTANEOUS
  Filled 2022-09-01: qty 0.5

## 2022-09-01 MED ORDER — PROSOURCE PLUS PO LIQD
30.0000 mL | Freq: Two times a day (BID) | ORAL | Status: DC
  Administered 2022-09-01 – 2022-09-11 (×6): 30 mL via ORAL
  Filled 2022-09-01 (×18): qty 30

## 2022-09-01 MED ORDER — CHLORHEXIDINE GLUCONATE CLOTH 2 % EX PADS
6.0000 | MEDICATED_PAD | Freq: Every day | CUTANEOUS | Status: DC
  Administered 2022-09-02 – 2022-09-10 (×9): 6 via TOPICAL

## 2022-09-01 MED ORDER — NEPRO/CARBSTEADY PO LIQD
237.0000 mL | Freq: Two times a day (BID) | ORAL | Status: DC
  Administered 2022-09-01 – 2022-09-11 (×6): 237 mL via ORAL

## 2022-09-01 MED ORDER — ACETAMINOPHEN 325 MG PO TABS
650.0000 mg | ORAL_TABLET | Freq: Four times a day (QID) | ORAL | Status: DC | PRN
  Administered 2022-09-05 – 2022-09-10 (×3): 650 mg via ORAL
  Filled 2022-09-01 (×3): qty 2

## 2022-09-01 MED ORDER — PANTOPRAZOLE SODIUM 40 MG IV SOLR
40.0000 mg | INTRAVENOUS | Status: DC
  Administered 2022-09-01: 40 mg via INTRAVENOUS
  Filled 2022-09-01: qty 10

## 2022-09-01 MED ORDER — RENA-VITE PO TABS
1.0000 | ORAL_TABLET | Freq: Every day | ORAL | Status: DC
  Administered 2022-09-01 – 2022-09-23 (×22): 1 via ORAL
  Filled 2022-09-01 (×22): qty 1

## 2022-09-01 MED ORDER — HYDRALAZINE HCL 20 MG/ML IJ SOLN
10.0000 mg | INTRAMUSCULAR | Status: DC | PRN
  Administered 2022-09-02 – 2022-09-20 (×6): 10 mg via INTRAVENOUS
  Filled 2022-09-01 (×6): qty 1

## 2022-09-01 NOTE — Progress Notes (Signed)
Contacted Elink regarding complaints of left foot pain; Patient has received 50 mcg of fentanyl without improvement ; not new orders received

## 2022-09-01 NOTE — Progress Notes (Signed)
eLink Physician-Brief Progress Note Patient Name: Albert Harris DOB: 01-Jul-1955 MRN: [id number]   Date of Service  09/01/2022  HPI/Events of Note  Notified that patient complained of foot pain and given fentanyl but was only minimally effective as per patient.    eICU Interventions  Trial of dilaudid.     Intervention Category Intermediate Interventions: Pain - evaluation and management  Elsie Lincoln 09/01/2022, 12:46 AM

## 2022-09-01 NOTE — Progress Notes (Addendum)
Albert Harris for Infectious Disease  Date of Admission:  08/28/2022   Total days of inpatient antibiotics 3  Principal Problem:   Lactic acidosis Active Problems:   ESRD (end stage renal disease) on dialysis [facility])          Assessment: 67 year old male with past medical history of PAD, ESRD HD, recently hospitalized 3/16-18 for acute on chronic left lower extremity ischemia.  He had undergone femoral endarterectomy with bovine pericardial patch, left iliofemoral profundofemoral thromboembolectomy, stent of the left common and external iliac arteries 3/16 with Dr. Donzetta Matters, vascular surgery admitted with left femoral pseudoaneurysm found to have MRSA bacteremia   #MRSA bacteremia likely secondary to left femoral wound postop infection - On 3/22 CT angio showed 1.1 cm left common femoral artery pseudoaneurysm, new.  New anterior pelvic extraperitoneal hematoma. -OR with Dr. Virl Cagey for left groin exploration, hematoma evacuation on 3/23. OR findings showed dehiscence at proximal aspect of bovine pericardial patch at the suture line. -Per OR note on 3/16 there is a stent in the left common and external iliac arteries.  The stents are left in place, patient will need to be on suppressive antibiotics following completion of likely 4-6 weeks of IV therapy.  Recommendations: - Continue vancomycin  -TTE showed AV/MC valve thickening. In a pr with RA bacteremia, ESRD with HD and recent post op vascular infection recommend TEE. Discussed with family and ordered.  - Follow repeat blood Cx to ensure clearance - Remove lines(RIJ placed on 3/22->bkiid Cx+ 3/23). Discussed with primary, possibly tomorrow.    #Right IJ in place for CRRT on 3/22 #History of ESRD on intermittent HD via right AV fistula - Fistula was clotted. - Right ankle J was placed - Patient is getting CRRT right now, would recommend line removal when able    #Family c/o pt has been chronically "jaundiced" -LFT s NL , NL  PT/INR -Consider repeating iron studies  Microbiology:   Antibiotics: Vancomycin 3/24-   Cultures: Blood 3/22 2/2 MRSA  3/25 ng   SUBJECTIVE: Resting in  bed. Family at bedside Interval:  Afebrile overnight. Wbc 29.5k Review of Systems: Review of Systems  All other systems reviewed and are negative.    Scheduled Meds:  Chlorhexidine Gluconate Cloth  6 each Topical Daily   [START ON 09/02/2022] Chlorhexidine Gluconate Cloth  6 each Topical Q0600   [START ON 09/02/2022] darbepoetin (ARANESP) injection - DIALYSIS  100 mcg Subcutaneous Q Wed-1800   multivitamin  1 tablet Oral QHS   sodium chloride flush  10-40 mL Intracatheter Q12H   Continuous Infusions:  sodium chloride     sodium chloride Stopped (09/01/22 0400)   heparin 1,700 Units/hr (09/01/22 0232)   vancomycin Stopped (08/31/22 1810)   PRN Meds:.Place/Maintain arterial line **AND** sodium chloride, acetaminophen **OR** acetaminophen, docusate sodium, heparin, HYDROmorphone (DILAUDID) injection, ondansetron (ZOFRAN) IV, mouth rinse, polyethylene glycol, sodium chloride, sodium chloride flush Allergies  Allergen Reactions   Benadryl [Diphenhydramine] Rash   Lidocaine-Prilocaine Rash    Caused red marks up and down arm    OBJECTIVE: Vitals:   09/01/22 0757 09/01/22 0900 09/01/22 1000 09/01/22 1030  BP:  104/71 98/68 (!) 139/105  Pulse:  80 80 87  Resp:  (!) 22 (!) 28 (!) 26  Temp: 98.5 F (36.9 C)     TempSrc: Oral     SpO2:  100% 100% 96%  Weight:      Height:       Body mass index  is 20.62 kg/m.  Physical Exam Constitutional:      General: He is not in acute distress.    Appearance: He is normal weight. He is not toxic-appearing.  HENT:     Head: Normocephalic and atraumatic.     Right Ear: External ear normal.     Left Ear: External ear normal.     Nose: No congestion or rhinorrhea.     Mouth/Throat:     Mouth: Mucous membranes are moist.     Pharynx: Oropharynx is clear.  Eyes:      Extraocular Movements: Extraocular movements intact.     Conjunctiva/sclera: Conjunctivae normal.     Pupils: Pupils are equal, round, and reactive to light.  Cardiovascular:     Rate and Rhythm: Normal rate and regular rhythm.     Heart sounds: No murmur heard.    No friction rub. No gallop.  Pulmonary:     Effort: Pulmonary effort is normal.     Breath sounds: Normal breath sounds.  Abdominal:     General: Abdomen is flat. Bowel sounds are normal.     Palpations: Abdomen is soft.  Musculoskeletal:        General: No swelling. Normal range of motion.     Cervical back: Normal range of motion and neck supple.  Skin:    General: Skin is warm and dry.     Comments: Wound vac  Neurological:     General: No focal deficit present.     Mental Status: He is oriented to person, place, and time.  Psychiatric:        Mood and Affect: Mood normal.       Lab Results Lab Results  Component Value Date   WBC 29.5 (H) 09/01/2022   HGB 7.8 (L) 09/01/2022   HCT 23.0 (L) 09/01/2022   MCV 97.9 09/01/2022   PLT 182 09/01/2022    Lab Results  Component Value Date   CREATININE 2.21 (H) 09/01/2022   BUN 25 (H) 09/01/2022   NA 136 09/01/2022   K 4.6 09/01/2022   CL 97 (L) 09/01/2022   CO2 23 09/01/2022    Lab Results  Component Value Date   ALT <5 08/28/2022   AST 21 08/28/2022   ALKPHOS 122 08/28/2022   BILITOT 1.2 08/28/2022        Laurice Record, [place] for Infectious [facility] 09/01/2022, 11:16 AM

## 2022-09-01 NOTE — Progress Notes (Signed)
Nutrition Follow-up  DOCUMENTATION CODES:   Non-severe (moderate) malnutrition in context of chronic illness  INTERVENTION:  Liberalize diet to 2g sodium to provide additional dining options appetite is poor and excessive restrictions are not beneficial in the acute care setting  Continue renavite daily for increased micronutrient needs with HD Prosource Plus BID to provide 100kcal and 15g of protein Nepro Shake po BID, each supplement provides 425 kcal and 19 grams protein  NUTRITION DIAGNOSIS:  Moderate Malnutrition (in the context of chronic illness (ESRD)) related to poor appetite as evidenced by moderate fat depletion, severe muscle depletion. - new dx established  GOAL:  Patient will meet greater than or equal to 90% of their needs - progressing, diet in place, adding supplements  MONITOR:   PO intake, Supplement acceptance, I & O's, Labs, Weight trends  REASON FOR ASSESSMENT:   Consult, Ventilator Enteral/tube feeding initiation and management (trickle tube feeds)  ASSESSMENT:   Pt admitted with abdominal pain, nausea, diaphoresis and unable to feel L foot d/t L femoral pseudoaneurysm. Pt was hypotensive with lactic acidosis upon admission. PMH significant for ESRD, PAD, COPD, HLD, HTN, HFrEF 35-40%.  3/22 - admit, emergent CRRT 3/23 - L fem artery repair 3/24 - extubated 3/26 - CRRT off  Pt resting in bed at the time of assessment. Daughter at bedside. Pt reports that appetite is getting better. States that it has been poor for months but that he ate ~50% of his breakfast. Also endorses weight loss. States that usual dry weight is 160lbs. Weight today measuring at 136 lbs. Significant muscle and fat depletions present on exam.  Pt reports that he has had nutrition supplements before, agreeable to receiving now. Encouraged good PO intake to aid in regaining lost muscle mass. Will liberalize diet. Labs have been WNL and having more dining choices may encourage better  PO. Will monitor K to determine if renal diet is warranted. Continue to restrict Na and fluid. Pt will likely be switched back to HD tomorrow.     Intake/Output Summary (Last 24 hours) at 09/01/2022 1436 Last data filed at 09/01/2022 0800 Gross per 24 hour  Intake 595.47 ml  Output 3282.3 ml  Net -2686.83 ml  Net IO Since Admission: -376.28 mL [09/01/22 1436]  Nutritionally Relevant Medications: Scheduled Meds:  multivitamin  1 tablet Oral QHS   Continuous Infusions:  vancomycin Stopped (08/31/22 1810)   PRN Meds: docusate sodium, ondansetron, polyethylene glycol  Labs Reviewed: Chloride 97 BUN 25, creatinine 2.21 Mg 2.7 CBG ranges from 81-104 mg/dL over the last 24 hours  NUTRITION - FOCUSED PHYSICAL EXAM: Flowsheet Row Most Recent Value  Orbital Region Mild depletion  Upper Arm Region Moderate depletion  Thoracic and Lumbar Region Moderate depletion  Buccal Region Mild depletion  Temple Region No depletion  Clavicle Bone Region Mild depletion  Clavicle and Acromion Bone Region Moderate depletion  Scapular Bone Region Moderate depletion  Dorsal Hand Severe depletion  Patellar Region Severe depletion  Anterior Thigh Region Severe depletion  Posterior Calf Region Severe depletion  Edema (RD Assessment) None  Hair Reviewed  Eyes Reviewed  Mouth Reviewed  Skin Reviewed  Nails Reviewed    Diet Order:   Diet Order             Diet 2 gram sodium Room service appropriate? Yes; Fluid consistency: Thin; Fluid restriction: 1200 mL Fluid  Diet effective now                   EDUCATION NEEDS:  Education needs have been addressed  Skin: Skin Integrity Issues: Stage II: medial coccyx (1 cm x 0.5 cms x 0.1 cm) per WOC Deep Tissue Injury: dark maroon purple discoloration covering sacrum, coccyx and bilateral buttocks (15 cm x 7 cm per WOC) Incisions: L groin (closed)  Last BM:  3/26 - type 6  Height:  Ht Readings from Last 1 Encounters:  08/29/22 5\' 8"  (1.727 m)     Weight:   Wt Readings from Last 1 Encounters:  09/01/22 61.5 kg    Ideal Body Weight:  70 kg  BMI:  Body mass index is 20.62 kg/m.  Estimated Nutritional Needs:  Kcal:  1900-2100 Protein:  95-110g/d Fluid:  1L + UOP   Ranell Patrick, RD, LDN Clinical Dietitian RD pager # available in AMION  After hours/weekend pager # available in [facility]

## 2022-09-01 NOTE — Progress Notes (Signed)
ANTICOAGULATION CONSULT NOTE Pharmacy Consult for Heparin Indication:  lower extremity limb ischemia  Allergies  Allergen Reactions   Benadryl [Diphenhydramine] Rash   Lidocaine-Prilocaine Rash    Caused red marks up and down arm    Patient Measurements: Height: 5\' 8"  (172.7 cm) Weight: 61.5 kg (135 lb 9.3 oz) IBW/kg (Calculated) : 68.4 Heparin Dosing Weight: 64.4 kg  Vital Signs: Temp: 98.5 F (36.9 C) (03/26 0757) Temp Source: Oral (03/26 0757) BP: 139/105 (03/26 1030) Pulse Rate: 87 (03/26 1030)  Labs: Recent Labs    08/30/22 0411 08/30/22 0935 08/31/22 0330 08/31/22 1600 09/01/22 0050 09/01/22 0340 09/01/22 0557 09/01/22 1010  HGB 8.9*   < > 7.7*  --   --  7.5* 7.8*  --   HCT 26.9*   < > 23.4*  --   --  23.7* 23.0*  --   PLT 213  --  182  --   --  182  --   --   HEPARINUNFRC  --    < > 0.30 0.20* 0.13*  --   --  0.12*  CREATININE 5.05*   < > 3.06* 2.35*  --  2.21*  --   --    < > = values in this interval not displayed.    Assessment: 67 yr old male with hx LLE thromboembolectomy s/p left groin pseudoaneurysm repair and hematoma evacuation for heparin.   Heparin level was collected and came back subtherapeutic at 0.12, on heparin 1700 units/hr. No s/sx of bleeding - hematoma remains unchanged. No infusion issues. CRRT stopped around 0300 this AM. Noted plans for HD 3/27.  Hgb: 7.8 trending up Plt: 182 stable  Goal of Therapy:  Heparin level 0.3-0.5 units/ml  Monitor platelets by anticoagulation protocol: Yes   Plan:  Increase heparin infusion to 1850 units/hr Obtain 8 hour heparin level Check heparin level daily, CBC daily  Monitor for signs and symptoms of bleeding Closely monitor hematoma   Tanja Port, Student Pharm-D 09/01/2022 11:25 AM  Please check AMION for all [place] phone numbers After 10:00 PM, call [place]

## 2022-09-01 NOTE — Progress Notes (Signed)
ANTICOAGULATION CONSULT NOTE Pharmacy Consult for Heparin Indication:  lower extremity limb ischemia  Allergies  Allergen Reactions   Benadryl [Diphenhydramine] Rash   Lidocaine-Prilocaine Rash    Caused red marks up and down arm    Patient Measurements: Height: 5\' 8"  (172.7 cm) Weight: 61.5 kg (135 lb 9.3 oz) IBW/kg (Calculated) : 68.4 Heparin Dosing Weight: 64.4 kg  Vital Signs: Temp: 97.8 F (36.6 C) (03/26 1920) Temp Source: Oral (03/26 1920) BP: 71/60 (03/26 1830) Pulse Rate: 70 (03/26 2115)  Labs: Recent Labs    08/30/22 0411 08/30/22 0935 08/31/22 0330 08/31/22 1600 09/01/22 0050 09/01/22 0340 09/01/22 0557 09/01/22 1010 09/01/22 1730 09/01/22 2042  HGB 8.9*   < > 7.7*  --   --  7.5* 7.8*  --   --   --   HCT 26.9*   < > 23.4*  --   --  23.7* 23.0*  --   --   --   PLT 213  --  182  --   --  182  --   --   --   --   HEPARINUNFRC  --    < > 0.30 0.20* 0.13*  --   --  0.12*  --  0.19*  CREATININE 5.05*   < > 3.06* 2.35*  --  2.21*  --   --  3.19*  --    < > = values in this interval not displayed.    Assessment: 67 yr old male with hx LLE thromboembolectomy s/p left groin pseudoaneurysm repair and hematoma evacuation for heparin.   Heparin level was subtherapeutic at 0.19, on heparin 1850 units/hr. No s/sx of bleeding - hematoma remains unchanged. No infusion issues. CRRT stopped around 0300 this AM. Noted plans for HD 3/27.  Goal of Therapy:  Heparin level 0.3-0.5 units/ml  Monitor platelets by anticoagulation protocol: Yes   Plan:  Increase heparin infusion to 2000 units/hr Obtain 8 hour heparin level with AM labs  Check heparin level daily, CBC daily  Monitor for signs and symptoms of bleeding Closely monitor hematoma   Antonietta Jewel, PharmD, [place] Pharmacist  Phone: [phone] 09/01/2022 9:30 PM  Please check AMION for all [place] phone numbers After 10:00 PM, call [place] [phone]

## 2022-09-01 NOTE — Progress Notes (Signed)
ANTICOAGULATION CONSULT NOTE Pharmacy Consult for Heparin Indication:  lower extremity limb ischemia  Allergies  Allergen Reactions   Benadryl [Diphenhydramine] Rash   Lidocaine-Prilocaine Rash    Caused red marks up and down arm    Patient Measurements: Height: 5\' 8"  (172.7 cm) Weight: 65.5 kg (144 lb 6.4 oz) IBW/kg (Calculated) : 68.4 Heparin Dosing Weight: 64.4 kg  Vital Signs: Temp: 98.4 F (36.9 C) (03/25 2340) Temp Source: Axillary (03/25 2340) Pulse Rate: 78 (03/26 0125)  Labs: Recent Labs    08/30/22 0411 08/30/22 0935 08/30/22 1559 08/30/22 1819 08/30/22 1950 08/31/22 0330 08/31/22 1600 09/01/22 0050  HGB 8.9*  --   --  8.5*  --  7.7*  --   --   HCT 26.9*  --   --  25.0*  --  23.4*  --   --   PLT 213  --   --   --   --  182  --   --   HEPARINUNFRC  --    < >  --   --    < > 0.30 0.20* 0.13*  CREATININE 5.05*  --  3.61*  --   --  3.06* 2.35*  --    < > = values in this interval not displayed.    Assessment: 67 yr old male with hx LLE thromboembolectomy s/p left groin pseudoaneurysm repair and hematoma evacuation for heparin.   Heparin level came back subtherapeutic at 0.2, on heparin 1450 units/hr. No s/sx of bleeding - hematoma remains unchanged. No infusion issues. Remains on CRRT.   3/26 AM update:  Heparin level sub-therapeutic   Goal of Therapy:  Heparin level 0.3-0.5 units/ml  Monitor platelets by anticoagulation protocol: Yes   Plan:  Increase heparin infusion to 1700 units/hr Heparin level in 6-8 hours  Narda Bonds, PharmD, [place] Pharmacist Phone: [phone]

## 2022-09-01 NOTE — Progress Notes (Signed)
NAME:  Albert Harris, MRN:  [id number], DOB:  [birth year], LOS: 4 ADMISSION DATE:  08/28/2022, CONSULTATION DATE:  08/28/22 REFERRING MD: Jeannine Kitten , CHIEF COMPLAINT:  Abdominal pain  History of Present Illness:   67 yo male brought to ER with abdominal pain, nausea, diaphoresis and unable to feel his Lt foot.  Hypotensive with lactic acidosis in ER.  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  Hx of ESRD and had hyperkalemia.  Had emergent CRRT.  Vascular consulted and taken to OR.    Pertinent  Medical History  ESRD -AV fistula (no flow tonight), PAD, COPD, HLD, HTN, HFrEF 35-40%, PAD  Significant Hospital Events: Including procedures, antibiotic start and stop dates in addition to other pertinent events   3/22 Admit, emergent CRRT, to OR for repair of distal Lt external iliac artery 3/24 Extubated. CT head obtained for altered mental status did not show any acute abnormalities  Interim History / Subjective:   Remains off pressors. Taken off CRRT today  Objective   Blood pressure (!) 115/46, pulse 86, temperature 98.5 F (36.9 C), temperature source Oral, resp. rate (!) 21, height 5\' 8"  (1.727 m), weight 61.5 kg, SpO2 96 %.        Intake/Output Summary (Last 24 hours) at 09/01/2022 0958 Last data filed at 09/01/2022 0700 Gross per 24 hour  Intake 716.43 ml  Output 3972.9 ml  Net -3256.47 ml   Filed Weights   08/30/22 0415 08/31/22 0400 09/01/22 0430  Weight: 65.9 kg 65.5 kg 61.5 kg    Examination: .Blood pressure (!) 115/46, pulse 83, temperature 98.6 F (37 C), resp. rate (!) 27, height 5\' 8"  (1.727 m), weight 65.5 kg, SpO2 100 %. Gen:      No acute distress HEENT:  EOMI, sclera anicteric Neck:     No masses; no thyromegaly Lungs:    Clear to auscultation bilaterally; normal respiratory effort CV:         Regular rate and rhythm; no murmurs Abd:      + bowel sounds; soft, non-tender; no palpable masses, no distension, Small hematoma on the  abdominal wall Ext:    No edema; adequate peripheral perfusion Skin:      Warm and dry; no rash Neuro: Awake, mild confusion  Labs/imaging reviewed Significant for WBC 29.5, hemoglobin 7.5, platelets 182 BUN/creatinine 25/2.21  Resolved Hospital Problem list   Anion gap metabolic acidosis with lactic acidosis, Hyperkalemia  Assessment & Plan:  MRSA blood cultures On Vancomycin Monitor fevers and leukocytosis ID is on board May need TEE  Lt common femoral artery pseudoaneurysm s/p Lt groin hematoma evacuation, primary repair of distal external iliac artery. Continue heparin drip.  Monitor abdominal wall hematoma.  If it becomes bigger or hemoglobin drops then will need to consider stopping anticoagulation Postop care per vascular surgery  ESRD. Off CRRT HD cath will have to be in place until AVG is declotted. Vascular surgery has been contracted  Acute respiratory failure 2nd to acidosis and uremia. Hx of COPD. Stable postextubation.  Follow intermittent chest x-ray  Anemia of critical illness and chronic disease. Follow CBC.  Transfuse for hemoglobin less than 7  Chronic systolic CHF with EF 35 to 40% on Echo from 06/29/22. Hx of pulmonary hypertension. - hold outpt toprol, plavix  Acute metabolic encephalopathy from uremia, acidosis. Monitor  Best Practice (right click and "Reselect all SmartList Selections" daily)   Diet/type: tubefeeds DVT prophylaxis: systemic heparin plan for surgery  GI prophylaxis: PPI.  DC as he is off the vent Lines: Dialysis Catheter and Arterial Line. DC A line. Remove dialysis cath after HD tomorrow.  Foley:  N/A Code Status:  full code Last date of multidisciplinary goals of care discussion [x]   Critical care time:    The patient is critically ill with multiple organ system failure and requires high complexity decision making for assessment and support, frequent evaluation and titration of therapies, advanced monitoring, review of  radiographic studies and interpretation of complex data.   Critical Care Time devoted to patient care services, exclusive of separately billable procedures, described in this note is 35 minutes.   Marshell Garfinkel MD [facility] See Amion for pager  If no response to pager , please call [phone] until 7pm After 7:00 pm call Elink  [phone] 09/01/2022, 9:58 AM

## 2022-09-01 NOTE — Progress Notes (Cosign Needed)
Progress Note    09/01/2022 6:46 AM 4 Days Post-Op  Subjective:  pt awake; no complaints  afebrile  Vitals:   09/01/22 0125 09/01/22 0354  BP:    Pulse: 78   Resp: 18   Temp:  98.4 F (36.9 C)  SpO2: 96%     Physical Exam: General:  resting in bed in no distress Lungs:  non labored Incisions:  left groin with Prevena in tact and good seal Extremities:  left foot appears viable; motor is decreased as is sensory (decreased out on the foot but intact more proximal)  CBC    Component Value Date/Time   WBC 29.5 (H) 09/01/2022 0340   RBC 2.42 (L) 09/01/2022 0340   HGB 7.8 (L) 09/01/2022 0557   HGB 10.4 (L) 06/18/2021 1431   HCT 23.0 (L) 09/01/2022 0557   HCT 31.6 (L) 06/18/2021 1431   PLT 182 09/01/2022 0340   PLT 142 (L) 06/18/2021 1431   MCV 97.9 09/01/2022 0340   MCV 96 06/18/2021 1431   MCH 31.0 09/01/2022 0340   MCHC 31.6 09/01/2022 0340   RDW 19.5 (H) 09/01/2022 0340   RDW 14.3 06/18/2021 1431   LYMPHSABS 1.5 08/28/2022 1810   LYMPHSABS 1.5 06/18/2021 1431   MONOABS 0.7 08/28/2022 1810   EOSABS 0.1 08/28/2022 1810   EOSABS 1.0 (H) 06/18/2021 1431   BASOSABS 0.1 08/28/2022 1810   BASOSABS 0.1 06/18/2021 1431    BMET    Component Value Date/Time   NA 136 09/01/2022 0557   NA 141 06/18/2021 1431   K 4.6 09/01/2022 0557   CL 97 (L) 09/01/2022 0340   CO2 23 09/01/2022 0340   GLUCOSE 96 09/01/2022 0340   BUN 25 (H) 09/01/2022 0340   BUN 20 06/18/2021 1431   CREATININE 2.21 (H) 09/01/2022 0340   CREATININE 1.15 02/19/2017 1049   CALCIUM 8.4 (L) 09/01/2022 0340   CALCIUM 8.5 (L) 06/10/2020 0900   GFRNONAA 32 (L) 09/01/2022 0340   GFRAA 6 (L) 03/04/2020 1045    INR    Component Value Date/Time   INR 1.2 08/12/2022 1501     Intake/Output Summary (Last 24 hours) at 09/01/2022 0646 Last data filed at 09/01/2022 0600 Gross per 24 hour  Intake 742.74 ml  Output 4044.7 ml  Net -3301.96 ml      Assessment/Plan:  67 y.o. male is s/p:  femoral  exploration, pseudoaneurysm repair   4 Days Post-Op   -pt left foot continues to appear viable but pt is at high risk for amputation if he develops a wound or rest pain.  Currently no indication for this.   -leukocytosis slightly improved from yesterday.  Pt with MRSA bacteremia not likely from left femoral incision. -Echo yesterday revealed EF of 45%.  No evidence of thrombus in atrium/ventricles.  No evidence of vegetation. -pt right FA AVG is clotted-may need embolectomy if condition improves.  -DVT prophylaxis:  heparin gtt    Leontine Locket, PA-C [facility] [phone] 09/01/2022 6:46 AM  Entered delayed:  [place] was seen yesterday.  He appeared to be more awake, alert, oriented. I talked to nutrition, he stated he has had minimal p.o. intake.  In talking with Wynonia Lawman, he stated he is just-not been hungry. He denies postprandial pain.  SMA with significant soft atheroma. Overall, Emaad remains critical.  I am worried that with his nutritional status, and current comorbidities including bacteremia, left lower extremity pseudoaneurysm now status post primary repair, limited mobility, Demontae is a high likelihood  of mortality in the next year. Regarding the left groin and MRSA bacteremia.  At the time my dissection, there were no signs or concerns of infection.  The distance appeared to be technical, however this cannot be ruled out.  Other complicating factors include emergent tunneled line prior to blood draw.  Consider TEE for valvular vegetations The right arm AV graft, which is now thrombosed, does not appear infected.   Broadus John MD

## 2022-09-01 NOTE — Progress Notes (Signed)
Nephrology Follow-Up Consult note   Assessment/Recommendations: Albert Harris is a/an 67 y.o. male with a past medical history significant for ESRD, PAD, CHF, HTN, admitted for critical limb ischemia.       ESRD: TTS and Rew via RUE AVG. was initially on CRRT given severe hemodynamic compromise.  Stop CRRT on the morning of 3/26.  Likely plan on dialysis tomorrow and over the next few days try to get him back on regular schedule.  Left common femoral artery pseudoaneurysm: Status post surgery with Dr. Unk Lightning on the morning of 08/29/2022 status post repair.  Overall much improved  Shock/MRSA Bacteremia: Likely multifactorial with infectious component. MRSA treatment per primary.  Will need TEE.  Trying to get temporary dialysis catheter removed as soon as possible.  Anemia: Likely multifactorial.  No IV iron given bacteremia. ESA ordered.  Metabolic encephalopathy: Likely multifactorial with uremia contributing.  Much improved  Dialysis access: AVG clotted.  Vascular surgery following.  Discussed with him today and hopefully can be declotted soon.   Recommendations conveyed to primary service.    [facility] 09/01/2022 9:18 AM  ___________________________________________________________  CC: Femoral artery pseudoaneurysm  Interval History/Subjective: Patient off CRRT around 3am. Negative 3L yesterday. BP overall stable. No complaints per patient today   Medications:  Current Facility-Administered Medications  Medication Dose Route Frequency Provider Last Rate Last Admin   0.9 %  sodium chloride infusion   Intra-arterial PRN Baglia, Corrina, PA-C       0.9 %  sodium chloride infusion  250 mL Intravenous Continuous Baglia, Corrina, PA-C   Stopped at 09/01/22 0400   acetaminophen (TYLENOL) tablet 650 mg  650 mg Oral Q6H PRN Mannam, Praveen, MD       Or   acetaminophen (TYLENOL) suppository 650 mg  650 mg Rectal Q4H PRN Mannam, Praveen, MD        Chlorhexidine Gluconate Cloth 2 % PADS 6 each  6 each Topical Daily Baglia, Corrina, PA-C   6 each at 08/31/22 2128   docusate sodium (COLACE) capsule 100 mg  100 mg Oral BID PRN Baglia, Corrina, PA-C       fentaNYL (SUBLIMAZE) injection 50 mcg  50 mcg Intravenous Q3H PRN Chesley Mires, MD   50 mcg at 08/31/22 2231   heparin ADULT infusion 100 units/mL (25000 units/27mL)  1,700 Units/hr Intravenous Continuous Erenest Blank, RPH 17 mL/hr at 09/01/22 0232 1,700 Units/hr at 09/01/22 0232   heparin injection 1,000-6,000 Units  1,000-6,000 Units CRRT PRN Baglia, Corrina, PA-C   3,000 Units at 09/01/22 0314   HYDROmorphone (DILAUDID) injection 0.5 mg  0.5 mg Intravenous Q2H PRN Elsie Lincoln, MD   0.5 mg at 09/01/22 0055   multivitamin (RENA-VIT) tablet 1 tablet  1 tablet Oral QHS Mannam, Praveen, MD       norepinephrine (LEVOPHED) 16 mg in 239mL (0.064 mg/mL) premix infusion  0-40 mcg/min Intravenous Titrated Mauri Brooklyn, MD   Stopped at 08/31/22 0321   ondansetron (ZOFRAN) injection 4 mg  4 mg Intravenous Q6H PRN Baglia, Corrina, PA-C       Oral care mouth rinse  15 mL Mouth Rinse PRN Chesley Mires, MD       pantoprazole (PROTONIX) injection 40 mg  40 mg Intravenous Q24H Chesley Mires, MD   40 mg at 08/31/22 0851   polyethylene glycol (MIRALAX / GLYCOLAX) packet 17 g  17 g Oral Daily PRN Baglia, Corrina, PA-C       prismasol BGK 2/2.5 replacement solution  CRRT Continuous Roney Jaffe, MD 400 mL/hr at 08/31/22 2134 New Bag at 08/31/22 2134   prismasol BGK 2/2.5 replacement solution   CRRT Continuous Roney Jaffe, MD 400 mL/hr at 08/31/22 2133 New Bag at 08/31/22 2133   prismasol BGK 4/2.5 infusion   CRRT Continuous Reesa Chew, MD 1,000 mL/hr at 08/31/22 2334 New Bag at 08/31/22 2334   sodium chloride 0.9 % primer fluid for CRRT   CRRT PRN Karoline Caldwell, PA-C   Given at 08/29/22 0335   sodium chloride flush (NS) 0.9 % injection 10-40 mL  10-40 mL Intracatheter Q12H Baglia, Corrina, PA-C    40 mL at 08/31/22 2129   sodium chloride flush (NS) 0.9 % injection 10-40 mL  10-40 mL Intracatheter PRN Baglia, Corrina, PA-C       vancomycin (VANCOREADY) IVPB 750 mg/150 mL  750 mg Intravenous Q24H Susa Raring, [facility]   Stopped at 08/31/22 1810      Review of Systems: 10 systems reviewed and negative except per HPI  Physical Exam: Vitals:   09/01/22 0700 09/01/22 0757  BP:    Pulse: 86   Resp: (!) 21   Temp:  98.5 F (36.9 C)  SpO2: 96%    No intake/output data recorded.  Intake/Output Summary (Last 24 hours) at 09/01/2022 [id number] Last data filed at 09/01/2022 0700 Gross per 24 hour  Intake 716.43 ml  Output 3972.9 ml  Net -3256.47 ml   Constitutional: lying in bed, nad ENMT: ears and nose without scars or lesions, MMM CV: normal rate, no edema Respiratory: bilateral chest rise, no iwob Gastrointestinal: soft, nonended Skin: no visible lesions or rashes Psych: mood and affect appropriate  Access: Right upper extremity AVG, no bruit or thrill   Test Results I personally reviewed new and old clinical labs and radiology tests Lab Results  Component Value Date   NA 136 09/01/2022   K 4.6 09/01/2022   CL 97 (L) 09/01/2022   CO2 23 09/01/2022   BUN 25 (H) 09/01/2022   CREATININE 2.21 (H) 09/01/2022   GFR 58.30 (L) 01/19/2017   CALCIUM 8.4 (L) 09/01/2022   ALBUMIN 1.9 (L) 09/01/2022   PHOS 3.0 09/01/2022    CBC Recent Labs  Lab 08/28/22 1810 08/28/22 2224 08/30/22 0411 08/30/22 1819 08/31/22 0330 09/01/22 0340 09/01/22 0557  WBC 19.2*   < > 19.3*  --  33.4* 29.5*  --   NEUTROABS 16.6*  --   --   --   --   --   --   HGB 8.7*   < > 8.9*   < > 7.7* 7.5* 7.8*  HCT 27.2*   < > 26.9*   < > 23.4* 23.7* 23.0*  MCV 107.5*   < > 93.7  --  97.5 97.9  --   PLT 329   < > 213  --  182 182  --    < > = values in this interval not displayed.

## 2022-09-02 ENCOUNTER — Inpatient Hospital Stay (HOSPITAL_COMMUNITY): Payer: Medicare Other | Admitting: Certified Registered Nurse Anesthetist

## 2022-09-02 ENCOUNTER — Encounter (HOSPITAL_COMMUNITY)
Admission: EM | Disposition: A | Discharge status: Hospice | Payer: Self-pay | Source: Home / Self Care | Attending: Internal Medicine

## 2022-09-02 ENCOUNTER — Other Ambulatory Visit: Payer: Self-pay

## 2022-09-02 ENCOUNTER — Encounter (HOSPITAL_COMMUNITY): Payer: Self-pay | Admitting: Pulmonary Disease

## 2022-09-02 DIAGNOSIS — I132 Hypertensive heart and chronic kidney disease with heart failure and with stage 5 chronic kidney disease, or end stage renal disease: Secondary | ICD-10-CM

## 2022-09-02 DIAGNOSIS — I509 Heart failure, unspecified: Secondary | ICD-10-CM | POA: Diagnosis not present

## 2022-09-02 DIAGNOSIS — N186 End stage renal disease: Secondary | ICD-10-CM

## 2022-09-02 DIAGNOSIS — D631 Anemia in chronic kidney disease: Secondary | ICD-10-CM

## 2022-09-02 DIAGNOSIS — T82868A Thrombosis of vascular prosthetic devices, implants and grafts, initial encounter: Secondary | ICD-10-CM

## 2022-09-02 DIAGNOSIS — Z87891 Personal history of nicotine dependence: Secondary | ICD-10-CM

## 2022-09-02 DIAGNOSIS — E872 Acidosis, unspecified: Secondary | ICD-10-CM | POA: Diagnosis not present

## 2022-09-02 HISTORY — PX: REVISION OF ARTERIOVENOUS GORETEX GRAFT: SHX6073

## 2022-09-02 LAB — HEPARIN LEVEL (UNFRACTIONATED): Heparin Unfractionated: 0.23 [IU]/mL — ABNORMAL LOW (ref 0.30–0.70)

## 2022-09-02 LAB — POCT I-STAT 7, (LYTES, BLD GAS, ICA,H+H)
Acid-base deficit: 5 mmol/L — ABNORMAL HIGH (ref 0.0–2.0)
Bicarbonate: 18.7 mmol/L — ABNORMAL LOW (ref 20.0–28.0)
Calcium, Ion: 1.14 mmol/L — ABNORMAL LOW (ref 1.15–1.40)
HCT: 27 % — ABNORMAL LOW (ref 39.0–52.0)
Hemoglobin: 9.2 g/dL — ABNORMAL LOW (ref 13.0–17.0)
O2 Saturation: 97 %
Patient temperature: 38
Potassium: 5 mmol/L (ref 3.5–5.1)
Sodium: 132 mmol/L — ABNORMAL LOW (ref 135–145)
TCO2: 20 mmol/L — ABNORMAL LOW (ref 22–32)
pCO2 arterial: 29 mmHg — ABNORMAL LOW (ref 32–48)
pH, Arterial: 7.422 (ref 7.35–7.45)
pO2, Arterial: 95 mmHg (ref 83–108)

## 2022-09-02 LAB — RENAL FUNCTION PANEL
Albumin: 1.9 g/dL — ABNORMAL LOW (ref 3.5–5.0)
Anion gap: 13 (ref 5–15)
BUN: 59 mg/dL — ABNORMAL HIGH (ref 8–23)
CO2: 21 mmol/L — ABNORMAL LOW (ref 22–32)
Calcium: 8.2 mg/dL — ABNORMAL LOW (ref 8.9–10.3)
Chloride: 98 mmol/L (ref 98–111)
Creatinine, Ser: 3.92 mg/dL — ABNORMAL HIGH (ref 0.61–1.24)
GFR, Estimated: 16 mL/min — ABNORMAL LOW (ref >60)
Glucose, Bld: 102 mg/dL — ABNORMAL HIGH (ref 70–99)
Phosphorus: 3.2 mg/dL (ref 2.5–4.6)
Potassium: 4.2 mmol/L (ref 3.5–5.1)
Sodium: 132 mmol/L — ABNORMAL LOW (ref 135–145)

## 2022-09-02 LAB — CULTURE, BLOOD (ROUTINE X 2): Special Requests: ADEQUATE

## 2022-09-02 LAB — CBC
HCT: 22.4 % — ABNORMAL LOW (ref 39.0–52.0)
Hemoglobin: 7.4 g/dL — ABNORMAL LOW (ref 13.0–17.0)
MCH: 31.9 pg (ref 26.0–34.0)
MCHC: 33 g/dL (ref 30.0–36.0)
MCV: 96.6 fL (ref 80.0–100.0)
Platelets: 180 10*3/uL (ref 150–400)
RBC: 2.32 MIL/uL — ABNORMAL LOW (ref 4.22–5.81)
RDW: 18.9 % — ABNORMAL HIGH (ref 11.5–15.5)
WBC: 28.7 10*3/uL — ABNORMAL HIGH (ref 4.0–10.5)
nRBC: 0.1 % (ref 0.0–0.2)

## 2022-09-02 LAB — PREPARE RBC (CROSSMATCH)

## 2022-09-02 LAB — MAGNESIUM: Magnesium: 2.7 mg/dL — ABNORMAL HIGH (ref 1.7–2.4)

## 2022-09-02 LAB — LACTIC ACID, PLASMA: Lactic Acid, Venous: 1 mmol/L (ref 0.5–1.9)

## 2022-09-02 LAB — HEPATITIS B SURFACE ANTIBODY, QUANTITATIVE: Hep B S AB Quant (Post): 12 m[IU]/mL

## 2022-09-02 LAB — POCT ACTIVATED CLOTTING TIME: Activated Clotting Time: 255 seconds

## 2022-09-02 LAB — VANCOMYCIN, RANDOM: Vancomycin Rm: 30 ug/mL

## 2022-09-02 SURGERY — REVISION OF ARTERIOVENOUS GORETEX GRAFT
Anesthesia: General | Site: Arm Lower | Laterality: Right

## 2022-09-02 MED ORDER — LACTATED RINGERS IV BOLUS
500.0000 mL | Freq: Once | INTRAVENOUS | Status: AC
  Administered 2022-09-02: 500 mL via INTRAVENOUS

## 2022-09-02 MED ORDER — BUPIVACAINE HCL (PF) 0.5 % IJ SOLN
INTRAMUSCULAR | Status: AC
  Filled 2022-09-02: qty 30

## 2022-09-02 MED ORDER — PHENYLEPHRINE 80 MCG/ML (10ML) SYRINGE FOR IV PUSH (FOR BLOOD PRESSURE SUPPORT)
PREFILLED_SYRINGE | INTRAVENOUS | Status: DC | PRN
  Administered 2022-09-02: 160 ug via INTRAVENOUS

## 2022-09-02 MED ORDER — BUPIVACAINE HCL (PF) 0.5 % IJ SOLN
INTRAMUSCULAR | Status: DC | PRN
  Administered 2022-09-02: 4.6 mL

## 2022-09-02 MED ORDER — HEPARIN SODIUM (PORCINE) 1000 UNIT/ML DIALYSIS
1000.0000 [IU] | INTRAMUSCULAR | Status: DC | PRN
  Administered 2022-09-08: 3200 [IU] via INTRAVENOUS_CENTRAL
  Administered 2022-09-09: 1600 [IU] via INTRAVENOUS_CENTRAL
  Administered 2022-09-12: 1000 [IU] via INTRAVENOUS_CENTRAL
  Administered 2022-09-19 – 2022-09-22 (×2): 3200 [IU] via INTRAVENOUS_CENTRAL
  Administered 2022-09-24 (×2): 1600 [IU] via INTRAVENOUS_CENTRAL
  Administered 2022-10-10: 3200 [IU] via INTRAVENOUS_CENTRAL
  Filled 2022-09-02 (×21): qty 1

## 2022-09-02 MED ORDER — HEPARIN 6000 UNIT IRRIGATION SOLUTION
Status: DC | PRN
  Administered 2022-09-02: 1

## 2022-09-02 MED ORDER — PENTAFLUOROPROP-TETRAFLUOROETH EX AERO: 1.0000 | INHALATION_SPRAY | CUTANEOUS | Status: DC | PRN

## 2022-09-02 MED ORDER — ORAL CARE MOUTH RINSE: 15.0000 mL | Freq: Once | OROMUCOSAL | Status: AC

## 2022-09-02 MED ORDER — CEFAZOLIN SODIUM-DEXTROSE 2-4 GM/100ML-% IV SOLN
INTRAVENOUS | Status: AC
  Filled 2022-09-02: qty 100

## 2022-09-02 MED ORDER — CHLORHEXIDINE GLUCONATE 0.12 % MT SOLN: 15.0000 mL | Freq: Once | OROMUCOSAL | Status: AC

## 2022-09-02 MED ORDER — NOREPINEPHRINE 4 MG/250ML-% IV SOLN
0.0000 ug/min | INTRAVENOUS | Status: DC
  Administered 2022-09-02: 2 ug/min via INTRAVENOUS
  Filled 2022-09-02: qty 250

## 2022-09-02 MED ORDER — HEPARIN SODIUM (PORCINE) 1000 UNIT/ML IJ SOLN
INTRAMUSCULAR | Status: AC
  Filled 2022-09-02: qty 10

## 2022-09-02 MED ORDER — PHENYLEPHRINE HCL (PRESSORS) 10 MG/ML IV SOLN
INTRAVENOUS | Status: AC
  Filled 2022-09-02: qty 1

## 2022-09-02 MED ORDER — PANTOPRAZOLE SODIUM 40 MG PO TBEC
40.0000 mg | DELAYED_RELEASE_TABLET | Freq: Every day | ORAL | Status: DC
  Administered 2022-09-02 – 2022-09-23 (×20): 40 mg via ORAL
  Filled 2022-09-02 (×20): qty 1

## 2022-09-02 MED ORDER — HEPARIN (PORCINE) 25000 UT/250ML-% IV SOLN
2100.0000 [IU]/h | INTRAVENOUS | Status: DC
  Administered 2022-09-02 – 2022-09-05 (×6): 2100 [IU]/h via INTRAVENOUS
  Filled 2022-09-02 (×6): qty 250

## 2022-09-02 MED ORDER — LABETALOL HCL 5 MG/ML IV SOLN
INTRAVENOUS | Status: DC | PRN
  Administered 2022-09-02: 2.5 mg via INTRAVENOUS

## 2022-09-02 MED ORDER — CEFAZOLIN SODIUM-DEXTROSE 2-3 GM-%(50ML) IV SOLR
INTRAVENOUS | Status: DC | PRN
  Administered 2022-09-02: 2 g via INTRAVENOUS

## 2022-09-02 MED ORDER — VANCOMYCIN VARIABLE DOSE PER UNSTABLE RENAL FUNCTION (PHARMACIST DOSING): Status: DC

## 2022-09-02 MED ORDER — SODIUM CHLORIDE 0.9 % IV SOLN: INTRAVENOUS | Status: DC

## 2022-09-02 MED ORDER — CHLORHEXIDINE GLUCONATE 0.12 % MT SOLN
OROMUCOSAL | Status: AC
  Administered 2022-09-02: 15 mL via OROMUCOSAL
  Filled 2022-09-02: qty 15

## 2022-09-02 MED ORDER — 0.9 % SODIUM CHLORIDE (POUR BTL) OPTIME
TOPICAL | Status: DC | PRN
  Administered 2022-09-02: 1000 mL

## 2022-09-02 MED ORDER — ALTEPLASE 2 MG IJ SOLR: 2.0000 mg | Freq: Once | INTRAMUSCULAR | Status: DC | PRN

## 2022-09-02 MED ORDER — HEPARIN SODIUM (PORCINE) 1000 UNIT/ML IJ SOLN
INTRAMUSCULAR | Status: DC | PRN
  Administered 2022-09-02 (×2): 5000 [IU] via INTRAVENOUS

## 2022-09-02 MED ORDER — SODIUM CHLORIDE 0.9% IV SOLUTION: Freq: Once | INTRAVENOUS | Status: DC

## 2022-09-02 MED ORDER — HEPARIN 6000 UNIT IRRIGATION SOLUTION
Status: AC
  Filled 2022-09-02: qty 500

## 2022-09-02 MED ORDER — HEPARIN (PORCINE) 25000 UT/250ML-% IV SOLN: 2100.0000 [IU]/h | INTRAVENOUS | Status: DC

## 2022-09-02 MED ORDER — SODIUM CHLORIDE 0.9 % IV SOLN: INTRAVENOUS | Status: DC | PRN

## 2022-09-02 SURGICAL SUPPLY — 43 items
ADH SKN CLS APL DERMABOND .7 (GAUZE/BANDAGES/DRESSINGS) ×1
ADH SKN CLS LQ APL DERMABOND (GAUZE/BANDAGES/DRESSINGS) ×1
ARMBAND PINK RESTRICT EXTREMIT (MISCELLANEOUS) ×2 IMPLANT
BAG COUNTER SPONGE SURGICOUNT (BAG) ×2 IMPLANT
BAG SPNG CNTER NS LX DISP (BAG) ×1
CANISTER SUCT 3000ML PPV (MISCELLANEOUS) ×2 IMPLANT
CATH EMB 3FR 40 (CATHETERS) IMPLANT
CATH EMB 4FR 40 (CATHETERS) IMPLANT
CLIP LIGATING EXTRA MED SLVR (CLIP) IMPLANT
CLIP LIGATING EXTRA SM BLUE (MISCELLANEOUS) IMPLANT
CLIP TI MEDIUM 6 (CLIP) ×2 IMPLANT
CNTNR URN SCR LID CUP LEK RST (MISCELLANEOUS) IMPLANT
CONT SPEC 4OZ STRL OR WHT (MISCELLANEOUS) ×1
COVER PROBE W GEL 5X96 (DRAPES) IMPLANT
DERMABOND ADVANCED .7 DNX12 (GAUZE/BANDAGES/DRESSINGS) ×2 IMPLANT
DERMABOND ADVANCED .7 DNX6 (GAUZE/BANDAGES/DRESSINGS) IMPLANT
ELECT REM PT RETURN 9FT ADLT (ELECTROSURGICAL) ×1
ELECTRODE REM PT RTRN 9FT ADLT (ELECTROSURGICAL) ×2 IMPLANT
GLOVE BIO SURGEON STRL SZ 6.5 (GLOVE) IMPLANT
GLOVE BIO SURGEON STRL SZ7 (GLOVE) IMPLANT
GLOVE BIOGEL PI IND STRL 8 (GLOVE) ×2 IMPLANT
GLOVE INDICATOR 7.0 STRL GRN (GLOVE) IMPLANT
GLOVE SURG SS PI 6.0 STRL IVOR (GLOVE) IMPLANT
GOWN STRL REUS W/ TWL LRG LVL3 (GOWN DISPOSABLE) ×4 IMPLANT
GOWN STRL REUS W/TWL 2XL LVL3 (GOWN DISPOSABLE) ×4 IMPLANT
GOWN STRL REUS W/TWL LRG LVL3 (GOWN DISPOSABLE) ×2
KIT BASIN OR (CUSTOM PROCEDURE TRAY) ×2 IMPLANT
KIT TURNOVER KIT B (KITS) ×2 IMPLANT
NDL HYPO 25GX1X1/2 BEV (NEEDLE) ×2 IMPLANT
NEEDLE HYPO 25GX1X1/2 BEV (NEEDLE) ×1 IMPLANT
NS IRRIG 1000ML POUR BTL (IV SOLUTION) ×2 IMPLANT
PACK CV ACCESS (CUSTOM PROCEDURE TRAY) ×2 IMPLANT
PAD ARMBOARD 7.5X6 YLW CONV (MISCELLANEOUS) ×4 IMPLANT
SPIKE FLUID TRANSFER (MISCELLANEOUS) ×2 IMPLANT
STOPCOCK 4 WAY LG BORE MALE ST (IV SETS) IMPLANT
SUT MNCRL AB 4-0 PS2 18 (SUTURE) ×2 IMPLANT
SUT PROLENE 6 0 BV (SUTURE) ×4 IMPLANT
SUT PROLENE 7 0 BV 1 (SUTURE) IMPLANT
SUT VIC AB 3-0 SH 27 (SUTURE) ×3
SUT VIC AB 3-0 SH 27X BRD (SUTURE) ×4 IMPLANT
TOWEL GREEN STERILE (TOWEL DISPOSABLE) ×2 IMPLANT
UNDERPAD 30X36 HEAVY ABSORB (UNDERPADS AND DIAPERS) ×2 IMPLANT
WATER STERILE IRR 1000ML POUR (IV SOLUTION) ×2 IMPLANT

## 2022-09-02 NOTE — Anesthesia Preprocedure Evaluation (Signed)
Anesthesia Evaluation  Patient identified by MRN, date of birth, ID band Patient awake    Reviewed: Allergy & Precautions, NPO status , Patient's Chart, lab work & pertinent test results  History of Anesthesia Complications Negative for: history of anesthetic complications  Airway Mallampati: II  TM Distance: >3 FB Neck ROM: Full    Dental  (+) Dental Advisory Given, Poor Dentition, Chipped, Loose, Missing,    Pulmonary asthma , COPD, former smoker   breath sounds clear to auscultation + decreased breath sounds      Cardiovascular hypertension, Pt. on home beta blockers and Pt. on medications pulmonary hypertension+ Peripheral Vascular Disease (Critical limb ischemia of right lower extremity) and +CHF   Rhythm:Regular Rate:Normal  Echo 06/2022  1. Left ventricular ejection fraction, by estimation, is 35 to 40%. The left ventricle has moderately decreased function. The left ventricle demonstrates regional wall motion abnormalities (see scoring diagram/findings for description). There is moderate concentric left ventricular hypertrophy. Left ventricular diastolic parameters are consistent with Grade I diastolic dysfunction (impaired relaxation). Elevated left ventricular end-diastolic pressure.   2. Right ventricular systolic function is normal. The right ventricular size is normal. There is severely elevated pulmonary artery systolic pressure.   3. Left atrial size was severely dilated.   4. Right atrial size was severely dilated.   5. The mitral valve is myxomatous. Mild mitral valve regurgitation. No evidence of mitral stenosis.   6. The aortic valve is normal in structure. There is mild calcification of the aortic valve. There is mild thickening of the aortic valve. Aortic valve regurgitation is not visualized. Aortic valve sclerosis/calcification is present, without any evidence of aortic stenosis. Aortic valve area, by VTI measures 2.74  cm. Aortic valve mean gradient measures 9.0 mmHg. Aortic valve Vmax measures 2.04 m/s.   7. The inferior vena cava is normal in size with <50% respiratory variability, suggesting right atrial pressure of 8 mmHg.     Neuro/Psych TIACVA    GI/Hepatic ,GERD  ,,(+)     substance abuse  alcohol use  Endo/Other  negative endocrine ROS    Renal/GU Renal disease     Musculoskeletal negative musculoskeletal ROS (+)    Abdominal   Peds  Hematology  (+) Blood dyscrasia (Plavix), anemia Lab Results      Component                Value               Date                      WBC                      28.7 (H)            09/02/2022                HGB                      7.4 (L)             09/02/2022                HCT                      22.4 (L)            09/02/2022  MCV                      96.6                09/02/2022                PLT                      180                 09/02/2022              Anesthesia Other Findings   Reproductive/Obstetrics                             Anesthesia Physical Anesthesia Plan  ASA: 4  Anesthesia Plan: MAC   Post-op Pain Management: Minimal or no pain anticipated   Induction:   PONV Risk Score and Plan: 1 and Treatment may vary due to age or medical condition  Airway Management Planned: Natural Airway and Nasal Cannula  Additional Equipment:   Intra-op Plan:   Post-operative Plan:   Informed Consent: I have reviewed the patients History and Physical, chart, labs and discussed the procedure including the risks, benefits and alternatives for the proposed anesthesia with the patient or authorized representative who has indicated his/her understanding and acceptance.     Dental advisory given  Plan Discussed with: CRNA  Anesthesia Plan Comments:        Anesthesia Quick Evaluation

## 2022-09-02 NOTE — Evaluation (Addendum)
Occupational Therapy Evaluation Patient Details Name: Albert Harris MRN: [id number] DOB: [birth year] Today's Date: 09/02/2022   History of Present Illness 67 year old male  ER with abdominal pain, nausea, diaphoresis and unable to feel his Lt foot. Hypotensive with lactic acidosis in ER.  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 CRRT 3/22-3/26.  3/22 repair of distal Lt external iliac artery. PMHx: PAD, ESRD HD, recently hospitalized 3/16-18 for acute on chronic left lower extremity ischemia.   Clinical Impression   Albert Harris was evaluated s/p the above admission list. He is generally indep at baseline per recent admission chart review. Upon evaluation he was limited by impaired cognition, weakness, pain, and decreased activity tolerance. Overall he required max-total A +2 for bed mobility and 1x stand attempt. Due to the deficits listed below he is also dependent for self care at bed level. Pt will benefit from continued acute OT services. Pt will benefit from intensive inpatient follow up therapy, >3 hours/day after discharge..       Recommendations for follow up therapy are one component of a multi-disciplinary discharge planning process, led by the attending physician.  Recommendations may be updated based on patient status, additional functional criteria and insurance authorization.   Assistance Recommended at Discharge Intermittent Supervision/Assistance  Patient can return home with the following Direct supervision/assist for medications management;Direct supervision/assist for financial management;Assist for transportation;Help with stairs or ramp for entrance;A lot of help with walking and/or transfers;A lot of help with bathing/dressing/bathroom;Assistance with cooking/housework;Assistance with feeding    Functional Status Assessment  Patient has had a recent decline in their functional status and demonstrates the ability to make significant improvements in  function in a reasonable and predictable amount of time.  Equipment Recommendations  Other (comment) (defer)    Recommendations for Other Services Rehab consult     Precautions / Restrictions Precautions Precautions: Fall Precaution Comments: Wound vac, L groin Restrictions Weight Bearing Restrictions: No      Mobility Bed Mobility Overal bed mobility: Needs Assistance Bed Mobility: Rolling, Supine to Sit, Sit to Supine Rolling: Max assist, +2 for safety/equipment, +2 for physical assistance   Supine to sit: Max assist, +2 for physical assistance, +2 for safety/equipment Sit to supine: Total assist, +2 for physical assistance, +2 for safety/equipment        Transfers Overall transfer level: Needs assistance Equipment used: 2 person hand held assist Transfers: Sit to/from Stand Sit to Stand: Max assist, +2 safety/equipment, +2 physical assistance           General transfer comment: brief standing EOB attempt      Balance Overall balance assessment: Needs assistance Sitting-balance support: Feet supported Sitting balance-Leahy Scale: Poor Sitting balance - Comments: min-max A for sitting EOB balance     Standing balance-Leahy Scale: Zero                             ADL either performed or assessed with clinical judgement   ADL Overall ADL's : Needs assistance/impaired Eating/Feeding: NPO                                     General ADL Comments: dependent for self care at bed level this date     Vision Baseline Vision/History: 0 No visual deficits Vision Assessment?: Vision impaired- to be further tested in functional context Additional Comments: eyes closed majority  of the session     Perception Perception Perception Tested?: No   Praxis Praxis Praxis tested?: Not tested    Pertinent Vitals/Pain Pain Assessment Pain Assessment: Faces Faces Pain Scale: Hurts even more Pain Location: "back" Pain Descriptors /  Indicators: Discomfort, Grimacing, Guarding Pain Intervention(s): Limited activity within patient's tolerance     Hand Dominance Right   Extremity/Trunk Assessment Upper Extremity Assessment Upper Extremity Assessment: Generalized weakness   Lower Extremity Assessment Lower Extremity Assessment: Defer to PT evaluation   Cervical / Trunk Assessment Cervical / Trunk Assessment: Kyphotic   Communication Communication Communication: HOH   Cognition Arousal/Alertness: Awake/alert Behavior During Therapy: Flat affect Overall Cognitive Status: Impaired/Different from baseline Area of Impairment: Orientation, Attention, Memory, Following commands, Safety/judgement, Awareness, Problem solving                 Orientation Level: Disoriented to, Situation, Time Current Attention Level: Focused Memory: Decreased recall of precautions, Decreased short-term memory Following Commands: Follows one step commands inconsistently Safety/Judgement: Decreased awareness of deficits, Decreased awareness of safety Awareness: Intellectual Problem Solving: Decreased initiation, Slow processing, Requires verbal cues, Difficulty sequencing General Comments: Able to state name and hospital with increased time, identified dtr in the room on 2nd attempt. Otherwise seemingly confused, followed simple commands with cues and time     General Comments  Elevated BP SBP in 190s, RN aware. dropped to 140s in sitting. Unsure of accuracy of A line    Exercises     Shoulder Instructions      Home Living Family/patient expects to be discharged to:: Private residence Living Arrangements: Spouse/significant other Available Help at Discharge: Family;Available 24 hours/day Type of Home: House Home Access: Stairs to enter [employer] of Steps: 5 Entrance Stairs-Rails: Right Home Layout: Two level;Able to live on main level with bedroom/bathroom     Bathroom Shower/Tub: Animal nutritionist: Standard Bathroom Accessibility: Yes How Accessible: Accessible via walker Home Equipment: Wheelchair - manual;Hand held shower head;Standard Walker          Prior Functioning/Environment Prior Level of Function : Independent/Modified Independent             Mobility Comments: ambulates without AD, denies falls. ADLs Comments: independent ADLs, limited IADLs, spouse drives and helps pt with socks. Wife assists with dishwashing        OT Problem List: Impaired balance (sitting and/or standing);Decreased activity tolerance;Decreased strength;Decreased knowledge of use of DME or AE;Decreased safety awareness      OT Treatment/Interventions: Self-care/ADL training;Therapeutic exercise;Balance training;Patient/family education;Therapeutic activities;DME and/or AE instruction    OT Goals(Current goals can be found in the care plan section) Acute Rehab OT Goals Patient Stated Goal: unable to state OT Goal Formulation: With patient Time For Goal Achievement: 09/16/22 Potential to Achieve Goals: Good ADL Goals Pt Will Perform Grooming: with min assist;sitting Pt Will Perform Upper Body Dressing: with min assist;sitting Pt Will Perform Lower Body Dressing: with mod assist;sit to/from stand Pt Will Transfer to Toilet: with mod assist;stand pivot transfer;bedside commode Additional ADL Goal #1: Pt will follow 3 step command to complete ADL task  OT Frequency: Min 2X/week    Co-evaluation PT/OT/SLP Co-Evaluation/Treatment: Yes Reason for Co-Treatment: Complexity of the patient's impairments (multi-system involvement);For patient/therapist safety;To address functional/ADL transfers   OT goals addressed during session: ADL's and self-care      AM-PAC OT "6 Clicks" Daily Activity     Outcome Measure Help from another person eating meals?: Total Help from another person taking care of  personal grooming?: Total Help from another person toileting, which includes  using toliet, bedpan, or urinal?: Total Help from another person bathing (including washing, rinsing, drying)?: Total Help from another person to put on and taking off regular upper body clothing?: Total Help from another person to put on and taking off regular lower body clothing?: Total 6 Click Score: 6   End of Session Equipment Utilized During Treatment: Gait belt Nurse Communication: Mobility status  Activity Tolerance: Patient tolerated treatment well Patient left: in bed;with call bell/phone within reach;with family/visitor present  OT Visit Diagnosis: Unsteadiness on feet (R26.81);Muscle weakness (generalized) (M62.81);Other symptoms and signs involving cognitive function;Dizziness and giddiness (R42)                Time: 1005-1030 OT Time Calculation (min): 25 min Charges:  OT General Charges $OT Visit: 1 Visit OT Evaluation $OT Eval Moderate Complexity: [place], OTR/L [place] [facility] [place] Communication Preferred   Elliot Cousin 09/02/2022, 12:27 PM

## 2022-09-02 NOTE — Progress Notes (Signed)
ANTICOAGULATION CONSULT NOTE Pharmacy Consult for Heparin Indication:  lower extremity limb ischemia  Allergies  Allergen Reactions   Benadryl [Diphenhydramine] Rash   Lidocaine-Prilocaine Rash    Caused red marks up and down arm    Patient Measurements: Height: 5\' 8"  (172.7 cm) Weight: 53.2 kg (117 lb 4.6 oz) IBW/kg (Calculated) : 68.4 Heparin Dosing Weight: 64.4 kg  Vital Signs: Temp: 97.9 F (36.6 C) (03/27 1151) Temp Source: Oral (03/27 1151) BP: 131/62 (03/27 1242) Pulse Rate: 87 (03/27 1242)  Labs: Recent Labs    08/31/22 0330 08/31/22 1600 09/01/22 0340 09/01/22 0557 09/01/22 1010 09/01/22 1730 09/01/22 2042 09/02/22 0424 09/02/22 1505  HGB 7.7*  --  7.5* 7.8*  --   --   --  7.4* 9.2*  HCT 23.4*  --  23.7* 23.0*  --   --   --  22.4* 27.0*  PLT 182  --  182  --   --   --   --  180  --   HEPARINUNFRC 0.30   < >  --   --  0.12*  --  0.19* 0.23*  --   CREATININE 3.06*   < > 2.21*  --   --  3.19*  --  3.92*  --    < > = values in this interval not displayed.    Assessment: 67 yr old male with hx LLE thromboembolectomy s/p left groin pseudoaneurysm repair and hematoma evacuation for heparin.   Underwent R arm AV loop graft embolectomy 3/27 - discussed with Dr Virl Cagey and plan to restart heparin in 4 hours. Was previously on heparin infusion at 2100 units/hr prior to procedure. Of note, did receive 10,000 units during procedure.  Goal of Therapy:  Heparin level 0.3-0.5 units/ml  Monitor platelets by anticoagulation protocol: Yes   Plan:  Restart heparin infusion at 2100 units/hr on 3/27@2000  Obtain 8 hour heparin level with AM labs  Check heparin level daily, CBC daily  Monitor for signs and symptoms of bleeding Closely monitor hematoma   Antonietta Jewel, PharmD, BCCCP Clinical Pharmacist  Phone: [phone] 09/02/2022 4:40 PM  Please check AMION for all [place] phone numbers After 10:00 PM, call [place] [phone]

## 2022-09-02 NOTE — Progress Notes (Addendum)
Progress Note    09/02/2022 7:33 AM 5 Days Post-Op  Subjective:  denies pain in the left foot.  afebrile  Vitals:   09/02/22 0645 09/02/22 0700  BP:    Pulse: 75 77  Resp: (!) 22 20  Temp:    SpO2: 100% 100%    Physical Exam: General:  on bed pan Lungs:  non labored Incisions:  left groin with Prevena vac with good seal. Extremities:  left foot warm; unable to wiggle toes on left foot. No wounds; right forearm loop graft without evidence of infection.  No erythema/drainage.    CBC    Component Value Date/Time   WBC 28.7 (H) 09/02/2022 0424   RBC 2.32 (L) 09/02/2022 0424   HGB 7.4 (L) 09/02/2022 0424   HGB 10.4 (L) 06/18/2021 1431   HCT 22.4 (L) 09/02/2022 0424   HCT 31.6 (L) 06/18/2021 1431   PLT 180 09/02/2022 0424   PLT 142 (L) 06/18/2021 1431   MCV 96.6 09/02/2022 0424   MCV 96 06/18/2021 1431   MCH 31.9 09/02/2022 0424   MCHC 33.0 09/02/2022 0424   RDW 18.9 (H) 09/02/2022 0424   RDW 14.3 06/18/2021 1431   LYMPHSABS 1.5 08/28/2022 1810   LYMPHSABS 1.5 06/18/2021 1431   MONOABS 0.7 08/28/2022 1810   EOSABS 0.1 08/28/2022 1810   EOSABS 1.0 (H) 06/18/2021 1431   BASOSABS 0.1 08/28/2022 1810   BASOSABS 0.1 06/18/2021 1431    BMET    Component Value Date/Time   NA 132 (L) 09/02/2022 0424   NA 141 06/18/2021 1431   K 4.2 09/02/2022 0424   CL 98 09/02/2022 0424   CO2 21 (L) 09/02/2022 0424   GLUCOSE 102 (H) 09/02/2022 0424   BUN 59 (H) 09/02/2022 0424   BUN 20 06/18/2021 1431   CREATININE 3.92 (H) 09/02/2022 0424   CREATININE 1.15 02/19/2017 1049   CALCIUM 8.2 (L) 09/02/2022 0424   CALCIUM 8.5 (L) 06/10/2020 0900   GFRNONAA 16 (L) 09/02/2022 0424   GFRAA 6 (L) 03/04/2020 1045    INR    Component Value Date/Time   INR 1.2 08/12/2022 1501     Intake/Output Summary (Last 24 hours) at 09/02/2022 0733 Last data filed at 09/02/2022 0600 Gross per 24 hour  Intake 1575.75 ml  Output 0 ml  Net 1575.75 ml      Assessment/Plan:  67 y.o. male  is s/p:  femoral exploration, pseudoaneurysm repair   5 Days Post-Op   -pt left foot unchanged.  Denies pain in the foot.  Continues to be at high risk for amputation if he develops wound or rest pain. -Prevena vac on left groin was alarming yesterday-I came over yesterday and canister removed and reapplied and has not alarmed again. -pt with MRSA bacteremia-right forearm loop graft no evidence of infection. -pt off pressors - Nephrology inquiring about timing of possible embolectomy of right FA AVG.  Will d/w Dr. Virl Cagey.   -DVT prophylaxis:  heparin gtt   Leontine Locket, PA-C [facility] [phone] 09/02/2022 7:33 AM  VASCULAR STAFF ADDENDUM: Will see shortly, please make NPO for right arm AV graft embolectomy this afternoon.   __________________________________   Patient seen and examined this morning.  I agree with the assessment above. There is a leak in the left-sided wound VAC which I was able to fix [place] had hiccups this morning.  States that he feels about the same as he did yesterday. Nutrition remains poor. Minimal motor in the left foot.  He  has not been out of bed. Stable leukocytosis, afebrile, currently on vancomycin for MRSA bacteremia. Patient needs to have his central line removed.  He will need either a tunneled dialysis catheter versus fistula embolectomy.  I think a fistula embolectomy would be in his best interest in an effort to salvage his long-term HD access. After discussing the risk and benefits of fistula embolectomy in the right arm, Red[place] elected to proceed. New blood cultures pending.       Cassandria Santee, MD [facility] Phone Number: [phone] 09/02/2022 8:28 AM  VASCULAR STAFF ADDENDUM: I have independently interviewed and examined the patient. I agree with the above.  Plan will be for local anesthesia. Right arm fistula embolectomy. Discussed with both [place] and his daughter.  She is aware of how sick he is.  Plan to send the thrombus to path.    Cassandria Santee, MD [facility] Phone Number: [phone] 09/02/2022 12:56 PM

## 2022-09-02 NOTE — TOC Initial Note (Signed)
Transition of Care [facility]) - Initial/Assessment Note    Patient Details  Name: Albert Harris MRN: [id number] Date of Birth: April 26, 1956  Transition of Care [facility]) CM/SW Contact:    Joanne Chars, LCSW Phone Number: 09/02/2022, 11:49 AM  Clinical Narrative:   CSW met with pt daughter Levada Dy for initial assessment.  Pt had eyes open but did not respond to questions, did not participate.  Per daughter, pt lives at home with wife, no current services.  Discussed current PT recommendations for SNF.  Levada Dy is familiar with SNF from another family member, says family will be open to this if it remains the recommendation.    TOC will continue to follow.                  Expected Discharge Plan: Skilled Nursing Facility Barriers to Discharge: Continued Medical Work up   Patient Goals and CMS Choice            Expected Discharge Plan and Services In-house Referral: Clinical Social Work   Post Acute Care Choice:  (TBD) Living arrangements for the past 2 months: Single Family Home                                      Prior Living Arrangements/Services Living arrangements for the past 2 months: Single Family Home Lives with:: Spouse Patient language and need for interpreter reviewed:: No        Need for Family Participation in Patient Care: Yes (Comment) Care giver support system in place?: Yes (comment) Current home services: Other (comment) (none) Criminal Activity/Legal Involvement Pertinent to Current Situation/Hospitalization: No - Comment as needed  Activities of Daily Living      Permission Sought/Granted                  Emotional Assessment Appearance:: Appears stated age Attitude/Demeanor/Rapport: Unable to Assess Affect (typically observed): Unable to Assess Orientation: :  (not recorded)      Admission diagnosis:  Lactic acidosis [E87.20] Patient Active Problem List   Diagnosis Date Noted   Malnutrition of moderate degree 09/01/2022    Lactic acidosis 08/28/2022   ESRD (end stage renal disease) on dialysis ([place]) 08/28/2022   Acute lower limb ischemia 08/21/2022   Leukocytosis 08/21/2022   Nucleic acid assay by PCR positive for norovirus 08/13/2022   Nausea vomiting and diarrhea 08/13/2022   Colitis 08/12/2022   Retroperitoneal hematoma 08/12/2022   C. difficile colitis 07/22/2022   Alcohol use disorder 06/30/2022   Clostridium difficile diarrhea 06/30/2022   Other ascites [id number]   Acute systolic congestive heart failure ([place]) 06/30/2022   ESRD on dialysis ([place]) 06/28/2022   Spinal stenosis of cervical region 06/28/2022   Diarrhea 06/28/2022   Cirrhosis of liver ([place]) 06/28/2022   Lymphedema 06/28/2022   HFrEF (heart failure with reduced ejection fraction) ([place]) 06/28/2022   PAD (peripheral artery disease) ([place]) 09/05/2021   Thrombocytopenia ([place]) 05/05/2021   Goals of care, counseling/discussion    Hypertension    COPD (chronic obstructive pulmonary disease) ([place])    Claudication in peripheral vascular disease ([place]) 09/12/2019   Carotid stenosis 09/12/2019   PFO (patent foramen ovale) 08/10/2019   Noncompliance 08/10/2019   Hx of transient ischemic attack ([name]) 08/10/2019   Chronic diastolic heart failure (HCC)    Elevated troponin    Hypertensive emergency    Acute pulmonary edema ([place]) 07/18/2019   ARF (acute renal  failure) ([place]) 07/18/2019   Macrocytic anemia 07/18/2019   ESRD (end stage renal disease) ([place]) 11/09/2017   PCP:  Kerin Perna, NP Pharmacy:   [facility] [place] [address] [place] [address] Phone: [phone] Fax: [phone]  [facility] [address] [place] [address] Phone: [phone] Fax: [phone]     Social Determinants of Health (SDOH) Social History: SDOH Screenings   Food Insecurity: Food Insecurity Present (08/22/2022)   Housing: Low Risk  (08/22/2022)  Transportation Needs: Unmet Transportation Needs (08/22/2022)  Utilities: Not At Risk (08/22/2022)  Alcohol Screen: Low Risk  (09/12/2019)  Depression (PHQ2-9): High Risk (05/07/2022)  Financial Resource Strain: Low Risk  (09/12/2019)  Stress: No Stress Concern Present (09/12/2019)  Tobacco Use: Medium Risk (08/30/2022)   SDOH Interventions:     Readmission Risk Interventions    08/24/2022   11:27 AM 08/14/2022   10:07 AM 07/24/2022   11:52 AM  Readmission Risk Prevention Plan  Transportation Screening Complete Complete Complete  Medication Review ([place]) Referral to Pharmacy Referral to Pharmacy Complete  PCP or Specialist appointment within 3-5 days of discharge  Complete Complete  HRI or [place] Complete Patient refused Complete  SW Recovery Care/Counseling Consult Complete Complete Patient refused  Palliative Care Screening Not Applicable Not Applicable Not [place] Not Applicable Not Applicable Not Applicable

## 2022-09-02 NOTE — Progress Notes (Signed)
[facility]  Date of Admission:  08/28/2022   Total days of inpatient antibiotics 3  Principal Problem:   Lactic acidosis Active Problems:   ESRD (end stage renal disease) on dialysis (HCC)   Malnutrition of moderate degree          Assessment: 67 year old male with past medical history of PAD, ESRD HD, recently hospitalized 3/16-18 for acute on chronic left lower extremity ischemia.  He had undergone femoral endarterectomy with bovine pericardial patch, left iliofemoral profundofemoral thromboembolectomy, stent of the left common and external iliac arteries 3/16 with Dr. Donzetta Matters, vascular surgery admitted with left femoral pseudoaneurysm found to have MRSA bacteremia   #MRSA bacteremia likely secondary to left femoral wound postop infection - On 3/22 CT angio showed 1.1 cm left common femoral artery pseudoaneurysm, new.  New anterior pelvic extraperitoneal hematoma. -OR with Dr. Virl Cagey for left groin exploration, hematoma evacuation on 3/23. OR findings showed dehiscence at proximal aspect of bovine pericardial patch at the suture line. -Per OR note on 3/16 there is a stent in the left common and external iliac arteries.  The stents are left in place, patient will need to be on suppressive antibiotics following completion of likely 4-6 weeks of IV therapy.  Recommendations: - Continue vancomycin  -Repeat blood Cx, 3/25 + gpc -TTE showed AV/MC valve thickening. In a pr with RA bacteremia, ESRD with HD and recent post op vascular infection recommend TEE. Tee planned for tomorrow - Remove lines(RIJ placed on 3/22->blood Cx+ 3/23).  Will need line holiday, blood Cx continue to be positive.  - Communicated with Vascular plan for AV graft embolectomy. No plans at this time for bovine patch/stent removal given risks. From the  antibiotic perspective will plan on suppressive regimen once treatment IV course complete.    #Right IJ in place for CRRT on  3/22 #History of ESRD on intermittent HD via right AV fistula - Fistula was clotted. - Right ankle J was placed - Patient is getting CRRT right now, would recommend line removal when able    #Family c/o pt has been chronically "jaundiced" -LFT s NL , NL PT/INR -Consider repeating iron studies  Microbiology:   Antibiotics: Vancomycin 3/24-   Cultures: Blood 3/22 2/2 MRSA  3/25 GPC   SUBJECTIVE: Resting in  bed. No new compalints Interval:  Afebrile overnight. Wbc 28.7k Review of Systems: Review of Systems  All other systems reviewed and are negative.    Scheduled Meds:  (feeding supplement) PROSource Plus  30 mL Oral BID BM   Chlorhexidine Gluconate Cloth  6 each Topical Q0600   darbepoetin (ARANESP) injection - DIALYSIS  100 mcg Subcutaneous Q Wed-1800   feeding supplement (NEPRO CARB STEADY)  237 mL Oral BID BM   multivitamin  1 tablet Oral QHS   pantoprazole  40 mg Oral Daily   sodium chloride flush  10-40 mL Intracatheter Q12H   vancomycin variable dose per unstable renal function (pharmacist dosing)   Does not apply See admin instructions   Continuous Infusions:  sodium chloride     sodium chloride Stopped (09/01/22 0400)   heparin 2,100 Units/hr (09/02/22 0800)   PRN Meds:.Place/Maintain arterial line **AND** sodium chloride, acetaminophen **OR** acetaminophen, alteplase, docusate sodium, heparin, hydrALAZINE, HYDROmorphone (DILAUDID) injection, ondansetron (ZOFRAN) IV, mouth rinse, pentafluoroprop-tetrafluoroeth, polyethylene glycol, sodium chloride flush Allergies  Allergen Reactions   Benadryl [Diphenhydramine] Rash   Lidocaine-Prilocaine Rash    Caused red marks up and down arm  OBJECTIVE: Vitals:   09/02/22 0800 09/02/22 0900 09/02/22 1000 09/02/22 1151  BP: 101/77  119/61   Pulse: 83 82 77   Resp: 19 (!) 22 (!) 25   Temp:    97.9 F (36.6 C)  TempSrc:    Oral  SpO2: 100% 100% 99%   Weight:      Height:       Body mass index is 17.83  kg/m.  Physical Exam Constitutional:      General: He is not in acute distress.    Appearance: He is normal weight. He is not toxic-appearing.  HENT:     Head: Normocephalic and atraumatic.     Right Ear: External ear normal.     Left Ear: External ear normal.     Nose: No congestion or rhinorrhea.     Mouth/Throat:     Mouth: Mucous membranes are moist.     Pharynx: Oropharynx is clear.  Eyes:     Extraocular Movements: Extraocular movements intact.     Conjunctiva/sclera: Conjunctivae normal.     Pupils: Pupils are equal, round, and reactive to light.  Cardiovascular:     Rate and Rhythm: Normal rate and regular rhythm.     Heart sounds: No murmur heard.    No friction rub. No gallop.  Pulmonary:     Effort: Pulmonary effort is normal.     Breath sounds: Normal breath sounds.  Abdominal:     General: Abdomen is flat. Bowel sounds are normal.     Palpations: Abdomen is soft.  Musculoskeletal:        General: No swelling. Normal range of motion.     Cervical back: Normal range of motion and neck supple.  Skin:    General: Skin is warm and dry.     Comments: Wound vac  Neurological:     General: No focal deficit present.     Mental Status: He is oriented to person, place, and time.  Psychiatric:        Mood and Affect: Mood normal.       Lab Results Lab Results  Component Value Date   WBC 28.7 (H) 09/02/2022   HGB 7.4 (L) 09/02/2022   HCT 22.4 (L) 09/02/2022   MCV 96.6 09/02/2022   PLT 180 09/02/2022    Lab Results  Component Value Date   CREATININE 3.92 (H) 09/02/2022   BUN 59 (H) 09/02/2022   NA 132 (L) 09/02/2022   K 4.2 09/02/2022   CL 98 09/02/2022   CO2 21 (L) 09/02/2022    Lab Results  Component Value Date   ALT <5 08/28/2022   AST 21 08/28/2022   ALKPHOS 122 08/28/2022   BILITOT 1.2 08/28/2022        Laurice Record, [facility] 09/02/2022, 11:55 AM

## 2022-09-02 NOTE — Op Note (Addendum)
NAME: Albert Harris    MRN: [id number] DOB: [birth year]    DATE OF OPERATION: 09/02/2022  PREOP DIAGNOSIS:    Thrombosed right forearm AV loop graft  POSTOP DIAGNOSIS:    Same  PROCEDURE:    Right arm AV loop graft embolectomy  SURGEON: Broadus John  ASSIST: Paulo Fruit, PA  ANESTHESIA: Local   EBL: 175ml  INDICATIONS:    Albert Harris is a 67 y.o. male who presented over the weekend with left lower extremity pain, hypotension, bacteremia.  While in the emergency department, his right arm AV loop graft thrombosed.  He was taken to surgery that evening once electrolytes were corrected for left common femoral artery pseudoaneurysm repair.  Overall, he has improved postoperatively, but the temporary HD line needs to be removed as he continues to have positive blood cultures.  After discussing the risk and benefits of right arm AV graft embolectomy for secondary patency of his loop graft, Albert Harris elected to proceed.  FINDINGS:   Thrombosed right arm AV loop graft Post-embolectomy  hypertension, tachycardia, fever, chills,  TECHNIQUE:   Patient was brought to the OR laid in supine position.  He was prepped and draped in standard fashion a timeout was performed.  I elected to perform the case with local anesthesia in an effort to limit perioperative morbidity. A longitudinal mark was made at the bottom of the AV loop graft.  Marcaine was injected as the patient was allergic to lidocaine.  A scalpel was used to make a longitudinal incision.  Next, the AV loop graft was controlled with the use of vessel loop.  The patient was heparinized with 5000 units IV heparin and a transverse arteriotomy made on the AV loop graft.  Embolectomy followed with a #4 Fogarty embolectomy catheter.  There was a significant amount of thrombus.  I performed a femoral embolectomy on the arterial aspect first, followed by the venous aspect.  While performing an embolectomy on the venous side of the AV  graft, the arterial side thrombosed.  I administered another 5000 units of IV heparin.  Again, Fogarty embolectomy followed, and both antegrade and retrograde bleeding were appreciated from the arterial and venous aspects of the graft respectively.  The graftotomy was closed with running 6-0 Prolene suture.  The wound bed was irrigated with antibiotic saline, and closed with 3-0 Vicryl and Monocryl at the level of the skin.  On further assessment the thrill that was initially appreciated had ceased.  An ultrasound demonstrated that the graft was once again thrombosed.  I elected to reopen the wound, and reopen the graftotomy to perform another Fogarty embolectomy with plans to follow it with a fistulogram.  During the embolectomy, the patient was noted to get very hypertensive to [id number] mmHg systolic, tachycardic.  He had fever and chills.  That he had no sedation on board, [place] began shaking.  With a blood pressure of [id number] systolic, there was excellent inflow, and normal venous backbleeding.  I elected to close the graftotomy irrigated the wound bed again and closed the deep tissue with 3-0 Vicryl followed by 4-0 Monocryl at the skin.  I elected to forego fistulogram as Albert Harris  was having a difficult time tolerating the operation, furthermore I was very concerned about his hemodynamic status.  His symptoms were concerning for either anesthesia reaction versus SIRS.  I found it interesting that he was hypertensive, but was concerned that he could easily become hypotensive.  In case completion, there was a  thrill in the fistula, the patient was taken to PACU for further workup.  The blood bank was called to begin workup for transfusion related reaction.  He is already on systemic antibiotics for MRSA bacteremia.  I updated his daughter regarding the above.   [name], MD [facility] DATE OF DICTATION:   09/02/2022

## 2022-09-02 NOTE — Progress Notes (Signed)
Physical Therapy Treatment Patient Details Name: Albert Harris MRN: [id number] DOB: 04-20-1956 Today's Date: 09/02/2022   History of Present Illness 67 year old male  ER with abdominal pain, nausea, diaphoresis and unable to feel his Lt foot. Hypotensive with lactic acidosis in ER.  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 CRRT 3/22-3/26.  3/22 repair of distal Lt external iliac artery. PMHx: PAD, ESRD HD, recently hospitalized 3/16-18 for acute on chronic left lower extremity ischemia.    PT Comments    Pt seen with OT for mobility progression. Pt initially disoriented and calls his daughter his wife, once sitting EOB pt more A&O. Pt continues to demonstrate functional weakness, impaired balance, AMS, and decreased activity tolerance. Pt requiring max-total +2 assist for bed mobility and x1 stand at EOB, pt fatiguing once standing. Pt would benefit from intensive, multidisciplinary rehabilitation post-acutely.     Recommendations for follow up therapy are one component of a multi-disciplinary discharge planning process, led by the attending physician.  Recommendations may be updated based on patient status, additional functional criteria and insurance authorization.  Follow Up Recommendations       Assistance Recommended at Discharge Frequent or constant Supervision/Assistance  Patient can return home with the following Assist for transportation;Two people to help with walking and/or transfers;Two people to help with bathing/dressing/bathroom;Assistance with cooking/housework;Help with stairs or ramp for entrance   Equipment Recommendations  Other (comment) (defer)    Recommendations for Other Services       Precautions / Restrictions Precautions Precautions: Fall Precaution Comments: Wound vac, L groin Restrictions Weight Bearing Restrictions: No     Mobility  Bed Mobility Overal bed mobility: Needs Assistance Bed Mobility: Rolling, Supine to Sit,  Sit to Supine Rolling: Max assist, +2 for safety/equipment, +2 for physical assistance   Supine to sit: Max assist, +2 for physical assistance, +2 for safety/equipment Sit to supine: Total assist, +2 for physical assistance, +2 for safety/equipment   General bed mobility comments: assist for trunk and LE management, scooting to/from EOB, rolling bilat for pericare as pt soiled in stool.    Transfers Overall transfer level: Needs assistance Equipment used: 2 person hand held assist Transfers: Sit to/from Stand Sit to Stand: Max assist, +2 safety/equipment, +2 physical assistance           General transfer comment: brief standing EOB attempt, heavy bed pad use for power up    Ambulation/Gait                   Stairs             Wheelchair Mobility    Modified Rankin (Stroke Patients Only)       Balance Overall balance assessment: Needs assistance Sitting-balance support: Feet supported Sitting balance-Leahy Scale: Poor Sitting balance - Comments: min-max A for sitting EOB balance   Standing balance support: Bilateral upper extremity supported, During functional activity, Reliant on assistive device for balance Standing balance-Leahy Scale: Zero                              Cognition Arousal/Alertness: Awake/alert Behavior During Therapy: WFL for tasks assessed/performed Overall Cognitive Status: Impaired/Different from baseline Area of Impairment: Memory, Following commands, Safety/judgement, Awareness, Attention, Orientation, Problem solving                 Orientation Level: Disoriented to, Situation, Time Current Attention Level: Focused Memory: Decreased recall of precautions, Decreased short-term memory  Following Commands: Follows one step commands inconsistently Safety/Judgement: Decreased awareness of deficits, Decreased awareness of safety Awareness: Intellectual Problem Solving: Decreased initiation, Slow processing,  Requires verbal cues, Difficulty sequencing General Comments: Able to state name and hospital with increased time, identified dtr in the room on 2nd attempt. Otherwise seemingly confused, followed simple commands with cues and time        Exercises      General Comments General comments (skin integrity, edema, etc.): SBP 190s at rest, drops to 140s with mobility but asymptomatic.      Pertinent Vitals/Pain Pain Assessment Pain Assessment: Faces Faces Pain Scale: Hurts even more Pain Location: "back" Pain Descriptors / Indicators: Discomfort, Grimacing, Guarding Pain Intervention(s): Limited activity within patient's tolerance, Monitored during session, Repositioned    Home Living                          Prior Function            PT Goals (current goals can now be found in the care plan section) Acute Rehab PT Goals Patient Stated Goal: Pt wants to get better and go home. PT Goal Formulation: With patient/family Time For Goal Achievement: 09/14/22 Potential to Achieve Goals: Good Progress towards PT goals: Progressing toward goals    Frequency    Min 3X/week      PT Plan Discharge plan needs to be updated    Co-evaluation   Reason for Co-Treatment: Complexity of the patient's impairments (multi-system involvement);For patient/therapist safety;To address functional/ADL transfers   OT goals addressed during session: ADL's and self-care      AM-PAC PT "6 Clicks" Mobility   Outcome Measure  Help needed turning from your back to your side while in a flat bed without using bedrails?: A Lot Help needed moving from lying on your back to sitting on the side of a flat bed without using bedrails?: A Lot Help needed moving to and from a bed to a chair (including a wheelchair)?: Total Help needed standing up from a chair using your arms (e.g., wheelchair or bedside chair)?: Total Help needed to walk in hospital room?: Total Help needed climbing 3-5 steps with  a railing? : Total 6 Click Score: 8    End of Session Equipment Utilized During Treatment: Gait belt Activity Tolerance: Patient limited by fatigue Patient left: in bed;with call bell/phone within reach;Other (comment);with family/visitor present Nurse Communication: Mobility status PT Visit Diagnosis: Other abnormalities of gait and mobility (R26.89);Muscle weakness (generalized) (M62.81);Difficulty in walking, not elsewhere classified (R26.2)     Time: 1005-1030 PT Time Calculation (min) (ACUTE ONLY): 25 min  Charges:  $Therapeutic Activity: 8-22 mins                     Albert Harris, PT DPT Acute Rehabilitation Services Pager [phone]  Office [phone]    Albert Harris 09/02/2022, 3:36 PM

## 2022-09-02 NOTE — Progress Notes (Signed)
NAME:  Albert Harris, MRN:  [id number], DOB:  Oct 19, 1955, LOS: 5 ADMISSION DATE:  08/28/2022, CONSULTATION DATE:  08/28/22 REFERRING MD: Jeannine Kitten , CHIEF COMPLAINT:  Abdominal pain  History of Present Illness:   67 yo male brought to ER with abdominal pain, nausea, diaphoresis and unable to feel his Lt foot.  Hypotensive with lactic acidosis in ER.  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  Hx of ESRD and had hyperkalemia.  Had emergent CRRT.  Vascular consulted and taken to OR.    Pertinent  Medical History  ESRD -AV fistula (no flow tonight), PAD, COPD, HLD, HTN, HFrEF 35-40%, PAD  Significant Hospital Events: Including procedures, antibiotic start and stop dates in addition to other pertinent events   3/22 Admit, emergent CRRT, to OR for repair of distal Lt external iliac artery 3/24 Extubated. CT head obtained for altered mental status did not show any acute abnormalities 3/26 Off CRRT and pressors, Vascular surgery consulted for clotted AVG  Interim History / Subjective:   Remains off pressors. BP is discordant between a line and cuff  Objective   Blood pressure 101/77, pulse 83, temperature 98.3 F (36.8 C), temperature source Axillary, resp. rate 19, height 5\' 8"  (1.727 m), weight 53.2 kg, SpO2 100 %.        Intake/Output Summary (Last 24 hours) at 09/02/2022 0940 Last data filed at 09/02/2022 0800 Gross per 24 hour  Intake 1417.15 ml  Output 0 ml  Net 1417.15 ml   Filed Weights   08/31/22 0400 09/01/22 0430 09/02/22 0342  Weight: 65.5 kg 61.5 kg 53.2 kg    Examination: Blood pressure 101/77, pulse 83, temperature 98.3 F (36.8 C), temperature source Axillary, resp. rate 19, height 5\' 8"  (1.727 m), weight 53.2 kg, SpO2 100 %. Gen:      No acute distress HEENT:  EOMI, sclera anicteric Neck:     No masses; no thyromegaly Lungs:    Clear to auscultation bilaterally; normal respiratory effort CV:         Regular rate and rhythm; no  murmurs Abd:      + bowel sounds; soft, non-tender; no palpable masses, no distension Ext:    No edema; adequate peripheral perfusion Skin:      Warm and dry; no rash Neuro: Awake, confused.  Labs/imaging reviewed Na 132, Cr 3.92 WBC improved to 28.7, Hb 7.4  Resolved Hospital Problem list   Anion gap metabolic acidosis with lactic acidosis, Hyperkalemia  Assessment & Plan:  MRSA blood cultures On Vancomycin Monitor fevers and leukocytosis ID is on board Will need TEE when able to tolerate it  Lt common femoral artery pseudoaneurysm s/p Lt groin hematoma evacuation, primary repair of distal external iliac artery. Continue heparin drip.  Monitor abdominal wall hematoma.  If it becomes bigger or hemoglobin drops then will need to consider stopping anticoagulation Postop care per vascular surgery  ESRD. Off CRRT HD cath will have to be in place until AVG is declotted. Vascular surgery has been contacted for embolectomy\ Remove HD cath after dialysis today  Acute respiratory failure 2nd to acidosis and uremia. Hx of COPD. Stable postextubation.  Follow intermittent chest x-ray  Anemia of critical illness and chronic disease. Follow CBC.  Transfuse for hemoglobin less than 7  Chronic systolic CHF with EF 35 to 40% on Echo from 06/29/22. Hx of pulmonary hypertension. Hold outpt toprol, plavix  Acute metabolic encephalopathy from uremia, acidosis. Monitor Family if concerned about his confusion. CT  head on 3/24 was normal. If he remains confused after HD today then we can consider rescanning head  Best Practice (right click and "Reselect all SmartList Selections" daily)   Diet/type: tubefeeds DVT prophylaxis: systemic heparin  GI prophylaxis: PPI. Lines: Dialysis Catheter and Arterial Line. Remove a line and dialysis cath after HD  Foley:  N/A Code Status:  full code Last date of multidisciplinary goals of care discussion [x]   Critical care time:    The patient is  critically ill with multiple organ system failure and requires high complexity decision making for assessment and support, frequent evaluation and titration of therapies, advanced monitoring, review of radiographic studies and interpretation of complex data.   Critical Care Time devoted to patient care services, exclusive of separately billable procedures, described in this note is 35 minutes.   Marshell Garfinkel MD [facility] See Amion for pager  If no response to pager , please call [phone] until 7pm After 7:00 pm call Elink  [phone] 09/02/2022, 9:40 AM

## 2022-09-02 NOTE — H&P (Signed)
Called to get nursing report for dialysis. I was told I would get a call back.

## 2022-09-02 NOTE — Progress Notes (Signed)
Albert Harris was seen in PACU and his blood pressure dropped precipitously from 247mmhg to high [id number] systolic.   Pressor medication was administered.  WhiIe in PACU his AV graft was also assessed and found to be thrombosed.  No plans to return to the OR with his current clinical status.  Will need dialysis when able and line holiday thereafter.  Will abandon this access and work up for new long term HD access once patient is doing better clinically.    Broadus John MD

## 2022-09-02 NOTE — Progress Notes (Signed)
Nephrology Follow-Up Consult note   Assessment/Recommendations: Albert Harris is a/an 67 y.o. male with a past medical history significant for ESRD, PAD, CHF, HTN, admitted for critical limb ischemia.       ESRD: TTS and [place] via RUE AVG. was initially on CRRT given severe hemodynamic compromise.  Stop CRRT on the morning of 3/26.  Plan for dialysis today as able and then remove hemodialysis catheter afterwards.  Also going for AVG declot today  Left common femoral artery pseudoaneurysm: Status post surgery with Dr. Unk Lightning on the morning of 08/29/2022 status post repair.  Overall much improved  Shock/MRSA Bacteremia: Likely multifactorial with infectious component. MRSA treatment per primary.  Will need TEE.  Hopefully remove temporary dialysis catheter today  Anemia: Likely multifactorial.  No IV iron given bacteremia. ESA ordered.  Metabolic encephalopathy: Likely multifactorial with uremia contributing.  Much improved  Dialysis access: AVG clotted.  Vascular surgery following.  Planning for declot today with Dr. Virl Cagey.  Appreciate help   Recommendations conveyed to primary service.    [facility] 09/02/2022 9:30 AM  ___________________________________________________________  CC: Femoral artery pseudoaneurysm  Interval History/Subjective: Patient is a tubes this morning.  Little bit of pain but otherwise denies any complaints.  Discordant blood pressures between cuff and A-line.  A-line felt to be accurate.   Medications:  Current Facility-Administered Medications  Medication Dose Route Frequency Provider Last Rate Last Admin   (feeding supplement) PROSource Plus liquid 30 mL  30 mL Oral BID BM Mannam, Praveen, MD   30 mL at 09/01/22 1410   0.9 %  sodium chloride infusion   Intra-arterial PRN Baglia, Corrina, PA-C       0.9 %  sodium chloride infusion  250 mL Intravenous Continuous Baglia, Corrina, PA-C   Stopped at 09/01/22 0400    acetaminophen (TYLENOL) tablet 650 mg  650 mg Oral Q6H PRN Mannam, Praveen, MD       Or   acetaminophen (TYLENOL) suppository 650 mg  650 mg Rectal Q4H PRN Mannam, Praveen, MD       Chlorhexidine Gluconate Cloth 2 % PADS 6 each  6 each Topical Q0600 Reesa Chew, MD       Darbepoetin Alfa (ARANESP) injection 100 mcg  100 mcg Subcutaneous Q Wed-1800 Reesa Chew, MD       docusate sodium (COLACE) capsule 100 mg  100 mg Oral BID PRN Baglia, Corrina, PA-C       feeding supplement (NEPRO CARB STEADY) liquid 237 mL  237 mL Oral BID BM Mannam, Praveen, MD   237 mL at 09/01/22 1410   heparin ADULT infusion 100 units/mL (25000 units/250mL)  2,100 Units/hr Intravenous Continuous Elsie Amis, RPH 21 mL/hr at 09/02/22 0800 2,100 Units/hr at 09/02/22 0800   hydrALAZINE (APRESOLINE) injection 10 mg  10 mg Intravenous Q4H PRN Frederik Pear, MD   10 mg at 09/02/22 0421   HYDROmorphone (DILAUDID) injection 0.5 mg  0.5 mg Intravenous Q2H PRN Elsie Lincoln, MD   0.5 mg at 09/02/22 0745   multivitamin (RENA-VIT) tablet 1 tablet  1 tablet Oral QHS Mannam, Praveen, MD   1 tablet at 09/01/22 2144   ondansetron (ZOFRAN) injection 4 mg  4 mg Intravenous Q6H PRN Baglia, Corrina, PA-C       Oral care mouth rinse  15 mL Mouth Rinse PRN Chesley Mires, MD       pantoprazole (PROTONIX) injection 40 mg  40 mg Intravenous Q24H Mannam, Hart Robinsons, MD  40 mg at 09/01/22 2026   polyethylene glycol (MIRALAX / GLYCOLAX) packet 17 g  17 g Oral Daily PRN Baglia, Corrina, PA-C       sodium chloride flush (NS) 0.9 % injection 10-40 mL  10-40 mL Intracatheter Q12H Baglia, Corrina, PA-C   10 mL at 09/01/22 2145   sodium chloride flush (NS) 0.9 % injection 10-40 mL  10-40 mL Intracatheter PRN Baglia, Corrina, PA-C       vancomycin variable dose per unstable renal function (pharmacist dosing)   Does not apply See admin instructions Elsie Amis, [facility]          Review of Systems: 10 systems reviewed and negative  except per HPI  Physical Exam: Vitals:   09/02/22 0739 09/02/22 0800  BP:  101/77  Pulse:  83  Resp:  19  Temp: 98.3 F (36.8 C)   SpO2:  100%   Total I/O In: 21 [I.V.:21] Out: 0   Intake/Output Summary (Last 24 hours) at 09/02/2022 0930 Last data filed at 09/02/2022 0800 Gross per 24 hour  Intake 1417.15 ml  Output 0 ml  Net 1417.15 ml   Constitutional: lying in bed, intermittent distress, hiccups ENMT: ears and nose without scars or lesions, MMM CV: normal rate, no edema Respiratory: bilateral chest rise, no iwob Gastrointestinal: soft, nonended Skin: no visible lesions or rashes Psych: mood and affect appropriate  Access: Right upper extremity AVG, no bruit or thrill   Test Results I personally reviewed new and old clinical labs and radiology tests Lab Results  Component Value Date   NA 132 (L) 09/02/2022   K 4.2 09/02/2022   CL 98 09/02/2022   CO2 21 (L) 09/02/2022   BUN 59 (H) 09/02/2022   CREATININE 3.92 (H) 09/02/2022   GFR 58.30 (L) 01/19/2017   CALCIUM 8.2 (L) 09/02/2022   ALBUMIN 1.9 (L) 09/02/2022   PHOS 3.2 09/02/2022    CBC Recent Labs  Lab 08/28/22 1810 08/28/22 2224 08/31/22 0330 09/01/22 0340 09/01/22 0557 09/02/22 0424  WBC 19.2*   < > 33.4* 29.5*  --  28.7*  NEUTROABS 16.6*  --   --   --   --   --   HGB 8.7*   < > 7.7* 7.5* 7.8* 7.4*  HCT 27.2*   < > 23.4* 23.7* 23.0* 22.4*  MCV 107.5*   < > 97.5 97.9  --  96.6  PLT 329   < > 182 182  --  180   < > = values in this interval not displayed.

## 2022-09-02 NOTE — Progress Notes (Signed)
Inpatient Rehab Admissions Coordinator Note:   Per PT recommendations patient was screened for CIR candidacy by Michel Santee, PT. At this time, pt appears to be a potential candidate for CIR. I will place an order for rehab consult for full assessment, per our protocol.  Please contact me any with questions.Shann Medal, PT, DPT [phone] 09/02/22 4:32 PM

## 2022-09-02 NOTE — Progress Notes (Signed)
Pt receives out-pt HD at [facility] on TTS. Will assist as needed.   Kameran Mcneese Renal Navigator [phone] 

## 2022-09-02 NOTE — Progress Notes (Signed)
PCCM note  Patient became hypertensive in the OR and then became hypotensive, started on Neo-Synephrine Brought back to the ICU Patient is awake and responsive.  Follows commands  Will give 500 cc LR bolus Change Neo-Synephrine to Levophed Check lactic acid Continue vancomycin.  Marshell Garfinkel MD [facility] See Amion for pager  If no response to pager , please call [phone] until 7pm After 7:00 pm call Elink  [phone] 09/02/2022, 4:10 PM

## 2022-09-02 NOTE — Anesthesia Procedure Notes (Signed)
Date/Time: 09/02/2022 1:37 PM  Performed by: Rande Brunt, CRNAPre-anesthesia Checklist: Patient identified, Emergency Drugs available, Suction available and Patient being monitored Oxygen Delivery Method: Nasal cannula Placement Confirmation: positive ETCO2 and CO2 detector

## 2022-09-02 NOTE — Transfer of Care (Signed)
Immediate Anesthesia Transfer of Care Note  Patient: JATHNIEL POLINSKY  Procedure(s) Performed: REVISION OF RIGHT ARTERIOVENOUS GORETEX GRAFT (Right: Arm Lower)  Patient Location: PACU  Anesthesia Type:General  Level of Consciousness: awake, alert , oriented, and drowsy  Airway & Oxygen Therapy: Patient Spontanous Breathing  Post-op Assessment: Report given to RN, Post -op Vital signs reviewed and stable, and Patient moving all extremities X 4  Post vital signs: Reviewed and stable  Last Vitals:  Vitals Value Taken Time  BP 98/70 09/02/22 1602  Temp    Pulse 87 09/02/22 1605  Resp 28 09/02/22 1605  SpO2 100 % 09/02/22 1605  Vitals shown include unvalidated device data.  Last Pain:  Vitals:   09/02/22 1251  TempSrc:   PainSc: 10-Worst pain ever      Patients Stated Pain Goal: 0 ([phone] [phone])  Complications: No notable events documented.

## 2022-09-02 NOTE — Progress Notes (Signed)
Pharmacy Antibiotic Note  Albert Harris is a 67 y.o. 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 Now with MRSA bacteremia. Noted patient is ESRD HD TTS PTA, with a right arm AV graft and a catheter in his right IJ. Pharmacy has been consulted for vancomycin dosing.   Pt was on CRRT from 3/23 to 3/26, plans noted for HD 3/27. Pt received vancomycin load 1500 mg x 1 on 3/24, and received vancomycin 750 mg IV q24h while on CRRT. Pre-HD vancomycin level 3/27 AM returned at 30 mcg/mL (goal 15-25 mcg/mL). Noted plans for HD 3/27 with 4 hour session and maximum BFR 400 mL/min ordered. If patient tolerates full session, expected vancomycin level will be therapeutic at 16.8 mcg/mL.   Plan: Post-HD dose not indicated for this session Evaluate levels as appropriate  Monitor HD schedule, blood cultures, TEE, clinical progression  Height: 5\' 8"  (172.7 cm) Weight: 53.2 kg (117 lb 4.6 oz) IBW/kg (Calculated) : 68.4  Temp (24hrs), Avg:98.2 F (36.8 C), Min:97.8 F (36.6 C), Max:98.5 F (36.9 C)  Recent Labs  Lab 08/28/22 2050 08/29/22 0210 08/29/22 0434 08/29/22 0500 08/29/22 0759 08/29/22 1120 08/29/22 1510 08/30/22 0411 08/30/22 1559 08/31/22 0330 08/31/22 1600 09/01/22 0340 09/01/22 1730 09/02/22 0424  WBC  --  19.5*  --   --   --   --   --  19.3*  --  33.4*  --  29.5*  --  28.7*  CREATININE  --   --   --    < >  --   --    < > 5.05*   < > 3.06* 2.35* 2.21* 3.19* 3.92*  LATICACIDVEN >9.0* 6.0* 3.8*  --  1.9 1.5  --   --   --   --   --   --   --   --   VANCORANDOM  --   --   --   --   --   --   --   --   --   --   --   --   --  30   < > = values in this interval not displayed.     Estimated Creatinine Clearance: 13.6 mL/min (A) (by C-G formula based on SCr of 3.92 mg/dL (H)).    Allergies  Allergen Reactions   Benadryl [Diphenhydramine] Rash   Lidocaine-Prilocaine Rash    Caused red marks up and down arm   Antimicrobials  this admission:  Cefazolin 3/23 x 1 Vancomycin 3/24 >>   Microbiology results:  3/25 Bcx: GPCs 1/4 bottles  3/23 Bcx: 3/4 bottles MRSA   Eliseo Gum, PharmD PGY1 Pharmacy Resident   09/02/2022  8:12 AM

## 2022-09-02 NOTE — Progress Notes (Signed)
ANTICOAGULATION CONSULT NOTE Pharmacy Consult for Heparin Indication:  lower extremity limb ischemia  Allergies  Allergen Reactions   Benadryl [Diphenhydramine] Rash   Lidocaine-Prilocaine Rash    Caused red marks up and down arm    Patient Measurements: Height: 5\' 8"  (172.7 cm) Weight: 53.2 kg (117 lb 4.6 oz) IBW/kg (Calculated) : 68.4 Heparin Dosing Weight: 64.4 kg  Vital Signs: Temp: 98.4 F (36.9 C) (03/27 0336) Temp Source: Axillary (03/27 0336) BP: 127/73 (03/27 0600) Pulse Rate: 78 (03/27 0600)  Labs: Recent Labs    08/31/22 0330 08/31/22 1600 09/01/22 0340 09/01/22 0557 09/01/22 1010 09/01/22 1730 09/01/22 2042 09/02/22 0424  HGB 7.7*  --  7.5* 7.8*  --   --   --  7.4*  HCT 23.4*  --  23.7* 23.0*  --   --   --  22.4*  PLT 182  --  182  --   --   --   --  180  HEPARINUNFRC 0.30   < >  --   --  0.12*  --  0.19* 0.23*  CREATININE 3.06*   < > 2.21*  --   --  3.19*  --  3.92*   < > = values in this interval not displayed.    Assessment: 67 yr old male with hx LLE thromboembolectomy s/p left groin pseudoaneurysm repair and hematoma evacuation for heparin.   Heparin level was subtherapeutic at 0.23, on heparin 2000 units/hr. Per RN, no issues with infusion, not bleeding and hematoma remains unchanged. CRRT stopped around 0300 3/26. Noted plans for HD 3/27.   Hgb: 7.4  Plt: 180  Goal of Therapy:  Heparin level 0.3-0.5 units/ml  Monitor platelets by anticoagulation protocol: Yes   Plan:  Increase heparin infusion to 2100 units/hr Obtain 8 hour heparin level with AM labs  Check heparin level daily, CBC daily  Monitor for signs and symptoms of bleeding Closely monitor hematoma   Tanja Port, Student Pharm-D 09/02/2022 6:28 AM  Please check AMION for all [place] phone numbers After 10:00 PM, call [place] [phone]

## 2022-09-03 ENCOUNTER — Encounter (HOSPITAL_COMMUNITY): Payer: Self-pay | Admitting: Vascular Surgery

## 2022-09-03 ENCOUNTER — Other Ambulatory Visit: Payer: Self-pay

## 2022-09-03 DIAGNOSIS — A419 Sepsis, unspecified organism: Secondary | ICD-10-CM

## 2022-09-03 DIAGNOSIS — I724 Aneurysm of artery of lower extremity: Secondary | ICD-10-CM

## 2022-09-03 DIAGNOSIS — G9341 Metabolic encephalopathy: Secondary | ICD-10-CM

## 2022-09-03 DIAGNOSIS — E872 Acidosis, unspecified: Secondary | ICD-10-CM | POA: Diagnosis not present

## 2022-09-03 DIAGNOSIS — R6521 Severe sepsis with septic shock: Principal | ICD-10-CM

## 2022-09-03 DIAGNOSIS — N186 End stage renal disease: Secondary | ICD-10-CM | POA: Diagnosis not present

## 2022-09-03 DIAGNOSIS — R2689 Other abnormalities of gait and mobility: Secondary | ICD-10-CM

## 2022-09-03 LAB — RENAL FUNCTION PANEL
Albumin: 1.9 g/dL — ABNORMAL LOW (ref 3.5–5.0)
Anion gap: 13 (ref 5–15)
BUN: 26 mg/dL — ABNORMAL HIGH (ref 8–23)
CO2: 25 mmol/L (ref 22–32)
Calcium: 7.8 mg/dL — ABNORMAL LOW (ref 8.9–10.3)
Chloride: 95 mmol/L — ABNORMAL LOW (ref 98–111)
Creatinine, Ser: 2.59 mg/dL — ABNORMAL HIGH (ref 0.61–1.24)
GFR, Estimated: 26 mL/min — ABNORMAL LOW (ref >60)
Glucose, Bld: 95 mg/dL (ref 70–99)
Phosphorus: 2.7 mg/dL (ref 2.5–4.6)
Potassium: 3.7 mmol/L (ref 3.5–5.1)
Sodium: 133 mmol/L — ABNORMAL LOW (ref 135–145)

## 2022-09-03 LAB — HEPARIN LEVEL (UNFRACTIONATED)
Heparin Unfractionated: 0.41 IU/mL (ref 0.30–0.70)
Heparin Unfractionated: 0.33 IU/mL (ref 0.30–0.70)

## 2022-09-03 LAB — CBC
HCT: 24.9 % — ABNORMAL LOW (ref 39.0–52.0)
Hemoglobin: 8.1 g/dL — ABNORMAL LOW (ref 13.0–17.0)
MCH: 30.1 pg (ref 26.0–34.0)
MCHC: 32.5 g/dL (ref 30.0–36.0)
MCV: 92.6 fL (ref 80.0–100.0)
Platelets: 181 10*3/uL (ref 150–400)
RBC: 2.69 MIL/uL — ABNORMAL LOW (ref 4.22–5.81)
RDW: 19.2 % — ABNORMAL HIGH (ref 11.5–15.5)
WBC: 22.3 10*3/uL — ABNORMAL HIGH (ref 4.0–10.5)
nRBC: 0.1 % (ref 0.0–0.2)

## 2022-09-03 LAB — TRANSFUSION REACTION
DAT C3: NEGATIVE
Post RXN DAT IgG: NEGATIVE

## 2022-09-03 LAB — CULTURE, BLOOD (ROUTINE X 2)

## 2022-09-03 NOTE — Progress Notes (Signed)
Post HD   09/03/22 0554  Vitals  Temp 98.1 F (36.7 C)  Pulse Rate 89  Resp (!) 28  BP (!) 106/37  SpO2 98 %  O2 Device Room Air  Oxygen Therapy  Patient Activity (if Appropriate) In bed  Pulse Oximetry Type Continuous  Post Treatment  Duration of HD Treatment -hour(s) 4 hour(s)  Liters Processed 66.1  Fluid Removed (mL) 2000 mL  Tolerated HD Treatment Yes  Post-Hemodialysis Comments Tx completed, temp cath poor flow and function despite reversed lines, reduced bfr, positioning. Goal reduced to support B/P, 2L achieved.

## 2022-09-03 NOTE — Anesthesia Postprocedure Evaluation (Signed)
Anesthesia Post Note  Patient: Albert Harris  Procedure(s) Performed: REVISION OF RIGHT ARTERIOVENOUS GORETEX GRAFT (Right: Arm Lower)     Patient location during evaluation: ICU Anesthesia Type: MAC Level of consciousness: awake and alert Pain management: pain level controlled Vital Signs Assessment: post-procedure vital signs reviewed and stable Respiratory status: spontaneous breathing Cardiovascular status: stable Anesthetic complications: no   No notable events documented.  Last Vitals:  Vitals:   09/03/22 1545 09/03/22 1600  BP:  124/67  Pulse:  79  Resp:  (!) 28  Temp: 36.8 C   SpO2:  97%    Last Pain:  Vitals:   09/03/22 1702  TempSrc:   PainSc: [name]

## 2022-09-03 NOTE — Progress Notes (Signed)
PCCM note  MRI brain and lumbar spine with contrast cannot be completed as needs to have dialysis within 24 hours hence procedure was canceled Reassess tomorrow and consider CT brain and lumbar spine without contrast or MRI brain and lumbar spine without contrast.  Marshell Garfinkel MD  Pulmonary & Critical care See Amion for pager  If no response to pager , please call [phone] until 7pm After 7:00 pm call Elink  [phone] 09/03/2022, 6:44 PM

## 2022-09-03 NOTE — Progress Notes (Signed)
Nephrology Follow-Up Consult note   Assessment/Recommendations: Albert Harris is a/an 67 y.o. male with a past medical history significant for ESRD, PAD, CHF, HTN, admitted for critical limb ischemia.       ESRD: TTS and [place] via RUE AVG. was initially on CRRT given severe hemodynamic compromise.  Stopped CRRT on the morning of 3/26.  Received dialysis morning of 09/03/2022.  Remove temporary dialysis catheter for line holiday.  Hopefully hold off on dialysis until Monday at that time can get tunneled dialysis catheter if infection has improved  Left common femoral artery pseudoaneurysm: Status post surgery with Dr. Unk Lightning on the morning of 08/29/2022 status post repair.  Overall much improved  Shock/MRSA Bacteremia: Likely multifactorial with infectious component. MRSA treatment per primary.  Will need TEE.  Remove temp HD cath today  Anemia: Likely multifactorial.  No IV iron given bacteremia. ESA ordered.  Metabolic encephalopathy: Likely multifactorial with uremia contributing.  Much improved  Dialysis access: AVG clotted attempted declot on 3/27 but unsuccessful. Will need TDC when able. Removing temp cath today   Recommendations conveyed to primary service.    [facility] 09/03/2022 9:02 AM  ___________________________________________________________  CC: Femoral artery pseudoaneurysm  Interval History/Subjective: No complaints this morning.  Attempted declot yesterday but unsuccessful.  Associated hypotension.  This did improve overnight and he underwent dialysis with 2 L removed.   Medications:  Current Facility-Administered Medications  Medication Dose Route Frequency Provider Last Rate Last Admin   (feeding supplement) PROSource Plus liquid 30 mL  30 mL Oral BID BM Baglia, Corrina, PA-C   30 mL at 09/01/22 1410   0.9 %  sodium chloride infusion (Manually program via Guardrails IV Fluids)   Intravenous Once Baglia, Corrina, PA-C        0.9 %  sodium chloride infusion   Intra-arterial PRN Baglia, Corrina, PA-C       0.9 %  sodium chloride infusion  250 mL Intravenous Continuous Baglia, Corrina, PA-C   Stopped at 09/01/22 0400   acetaminophen (TYLENOL) tablet 650 mg  650 mg Oral Q6H PRN Baglia, Corrina, PA-C       Or   acetaminophen (TYLENOL) suppository 650 mg  650 mg Rectal Q4H PRN Baglia, Corrina, PA-C       alteplase (CATHFLO ACTIVASE) injection 2 mg  2 mg Intracatheter Once PRN Baglia, Corrina, PA-C       Chlorhexidine Gluconate Cloth 2 % PADS 6 each  6 each Topical Q0600 Baglia, Corrina, PA-C   6 each at 09/02/22 1226   Darbepoetin Alfa (ARANESP) injection 100 mcg  100 mcg Subcutaneous Q Wed-1800 Baglia, Corrina, PA-C   100 mcg at 09/02/22 1731   docusate sodium (COLACE) capsule 100 mg  100 mg Oral BID PRN Baglia, Corrina, PA-C       feeding supplement (NEPRO CARB STEADY) liquid 237 mL  237 mL Oral BID BM Baglia, Corrina, PA-C   237 mL at 09/01/22 1410   heparin ADULT infusion 100 units/mL (25000 units/273mL)  2,100 Units/hr Intravenous Continuous Mannam, Praveen, MD 21 mL/hr at 09/03/22 0700 2,100 Units/hr at 09/03/22 0700   heparin injection 1,000 Units  1,000 Units Dialysis PRN Baglia, Corrina, PA-C       hydrALAZINE (APRESOLINE) injection 10 mg  10 mg Intravenous Q4H PRN Baglia, Corrina, PA-C   10 mg at 09/02/22 0421   HYDROmorphone (DILAUDID) injection 0.5 mg  0.5 mg Intravenous Q2H PRN Baglia, Corrina, PA-C   0.5 mg at 09/03/22 [id number]  multivitamin (RENA-VIT) tablet 1 tablet  1 tablet Oral QHS Baglia, Corrina, PA-C   1 tablet at 09/02/22 2225   norepinephrine (LEVOPHED) 4mg  in 257mL (0.016 mg/mL) premix infusion  0-40 mcg/min Intravenous Titrated Mannam, Praveen, MD   Stopped at 09/02/22 1654   ondansetron (ZOFRAN) injection 4 mg  4 mg Intravenous Q6H PRN Baglia, Corrina, PA-C       Oral care mouth rinse  15 mL Mouth Rinse PRN Baglia, Corrina, PA-C       pantoprazole (PROTONIX) EC tablet 40 mg  40 mg Oral Daily Baglia,  Corrina, PA-C   40 mg at 09/02/22 1714   pentafluoroprop-tetrafluoroeth (GEBAUERS) aerosol 1 Application  1 Application Topical PRN Baglia, Corrina, PA-C       polyethylene glycol (MIRALAX / GLYCOLAX) packet 17 g  17 g Oral Daily PRN Baglia, Corrina, PA-C       sodium chloride flush (NS) 0.9 % injection 10-40 mL  10-40 mL Intracatheter Q12H Baglia, Corrina, PA-C   10 mL at 09/02/22 1012   sodium chloride flush (NS) 0.9 % injection 10-40 mL  10-40 mL Intracatheter PRN Baglia, Corrina, PA-C       vancomycin variable dose per unstable renal function (pharmacist dosing)   Does not apply See admin instructions Baglia, Corrina, PA-C          Review of Systems: 10 systems reviewed and negative except per HPI  Physical Exam: Vitals:   09/03/22 0741 09/03/22 0745  BP:    Pulse: 88 96  Resp: 16 (!) 22  Temp: 98.1 F (36.7 C)   SpO2: 98% 97%   No intake/output data recorded.  Intake/Output Summary (Last 24 hours) at 09/03/2022 0902 Last data filed at 09/03/2022 0700 Gross per 24 hour  Intake 1692.28 ml  Output 2001 ml  Net -308.72 ml   Constitutional: lying in bed, nad ENMT: ears and nose without scars or lesions, MMM CV: normal rate, no edema Respiratory: bilateral chest rise, no iwob Gastrointestinal: soft, nonended Skin: no visible lesions or rashes Psych: mood and affect appropriate  Access: Right upper extremity AVG, no bruit or thrill   Test Results I personally reviewed new and old clinical labs and radiology tests Lab Results  Component Value Date   NA 132 (L) 09/02/2022   K 5.0 09/02/2022   CL 98 09/02/2022   CO2 21 (L) 09/02/2022   BUN 59 (H) 09/02/2022   CREATININE 3.92 (H) 09/02/2022   GFR 58.30 (L) 01/19/2017   CALCIUM 8.2 (L) 09/02/2022   ALBUMIN 1.9 (L) 09/02/2022   PHOS 3.2 09/02/2022    CBC Recent Labs  Lab 08/28/22 1810 08/28/22 2224 09/01/22 0340 09/01/22 0557 09/02/22 0424 09/02/22 1505 09/03/22 0624  WBC 19.2*   < > 29.5*  --  28.7*  --   22.3*  NEUTROABS 16.6*  --   --   --   --   --   --   HGB 8.7*   < > 7.5*   < > 7.4* 9.2* 8.1*  HCT 27.2*   < > 23.7*   < > 22.4* 27.0* 24.9*  MCV 107.5*   < > 97.9  --  96.6  --  92.6  PLT 329   < > 182  --  180  --  181   < > = values in this interval not displayed.

## 2022-09-03 NOTE — Progress Notes (Signed)
[facility]  Date of Admission:  08/28/2022     Principal Problem:   Lactic acidosis Active Problems:   ESRD (end stage renal disease) on dialysis [facility])   Malnutrition of moderate degree          Assessment: 67 year old male with past medical history of PAD, ESRD HD, recently hospitalized 3/16-18 for acute on chronic left lower extremity ischemia.  He had undergone femoral endarterectomy with bovine pericardial patch, left iliofemoral profundofemoral thromboembolectomy, stent of the left common and external iliac arteries 3/16 with Dr. Donzetta Matters, vascular surgery admitted with left femoral pseudoaneurysm found to have MRSA bacteremia   #MRSA bacteremia likely secondary to left femoral wound postop infection - On 3/22 CT angio showed 1.1 cm left common femoral artery pseudoaneurysm, new.  New anterior pelvic extraperitoneal hematoma. -OR with Dr. Virl Cagey for left groin exploration, hematoma evacuation on 3/23. OR findings showed dehiscence at proximal aspect of bovine pericardial patch at the suture line. -Per OR note on 3/16 there is a stent in the left common and external iliac arteries.  The stents are left in place, patient will need to be on suppressive antibiotics following completion of likely 4-6 weeks of IV therapy.   Recommendations: - Continue vancomycin  - Follow repeat blood Cx -TTE showed AV/MC valve thickening. In a pr with RA bacteremia, ESRD with HD and recent post op vascular infection recommend TEE. Tee planned for tomorrow. - Remove lines(RIJ placed on 3/22->blood Cx+ 3/23) with line holiday x 72h - Communicated with Vascular no plans at this time for bovine patch/stent removal given risks. From the  antibiotic perspective will plan on suppressive regimen once treatment IV course complete.    #LLE weakness #Change in mentation -PT was Aox3, daughter reprots change in mentation since admission. Also, states LLE weakness started on  Friday. C/O back pain per daughter.  -Recommend MRI brian and MRI L spine  #Right IJ #History of ESRD on intermittent HD via right AV fistula - Fistula was clotted. - Right IJ was placed -R AV fistula thrombectomy attempted and aborted on 3/28 due to severe HTN    #Family c/o pt has been chronically "jaundiced" -LFT s NL , NL PT/INR -Consider repeating iron studies  Microbiology:   Antibiotics: Vancomycin 3/24-   Cultures: Blood 3/22 2/2 MRSA  3/25 GPC 3/27   SUBJECTIVE: Resting in  bed. No new compalints Interval:  Afebrile overnight. Wbc 22k Review of Systems: Review of Systems  All other systems reviewed and are negative.    Scheduled Meds:  (feeding supplement) PROSource Plus  30 mL Oral BID BM   sodium chloride   Intravenous Once   Chlorhexidine Gluconate Cloth  6 each Topical Q0600   darbepoetin (ARANESP) injection - DIALYSIS  100 mcg Subcutaneous Q Wed-1800   feeding supplement (NEPRO CARB STEADY)  237 mL Oral BID BM   multivitamin  1 tablet Oral QHS   pantoprazole  40 mg Oral Daily   sodium chloride flush  10-40 mL Intracatheter Q12H   vancomycin variable dose per unstable renal function (pharmacist dosing)   Does not apply See admin instructions   Continuous Infusions:  sodium chloride     sodium chloride Stopped (09/01/22 0400)   heparin 2,100 Units/hr (09/03/22 0700)   norepinephrine (LEVOPHED) Adult infusion Stopped (09/02/22 1654)   PRN Meds:.Place/Maintain arterial line **AND** sodium chloride, acetaminophen **OR** acetaminophen, alteplase, docusate sodium, heparin, hydrALAZINE, HYDROmorphone (DILAUDID) injection, ondansetron (ZOFRAN) IV, mouth  rinse, pentafluoroprop-tetrafluoroeth, polyethylene glycol, sodium chloride flush Allergies  Allergen Reactions   Benadryl [Diphenhydramine] Rash   Lidocaine-Prilocaine Rash    Caused red marks up and down arm    OBJECTIVE: Vitals:   09/03/22 1100 09/03/22 1137 09/03/22 1200 09/03/22 1300  BP:       Pulse: 88  85 93  Resp: (!) 29  (!) 24 (!) 21  Temp:  97.8 F (36.6 C)    TempSrc:  Oral    SpO2: 99%  98% 96%  Weight:      Height:       Body mass index is 17.83 kg/m.  Physical Exam Constitutional:      General: He is not in acute distress.    Appearance: He is normal weight. He is not toxic-appearing.  HENT:     Head: Normocephalic and atraumatic.     Right Ear: External ear normal.     Left Ear: External ear normal.     Nose: No congestion or rhinorrhea.     Mouth/Throat:     Mouth: Mucous membranes are moist.     Pharynx: Oropharynx is clear.  Eyes:     Extraocular Movements: Extraocular movements intact.     Conjunctiva/sclera: Conjunctivae normal.     Pupils: Pupils are equal, round, and reactive to light.  Cardiovascular:     Rate and Rhythm: Normal rate and regular rhythm.     Heart sounds: No murmur heard.    No friction rub. No gallop.  Pulmonary:     Effort: Pulmonary effort is normal.     Breath sounds: Normal breath sounds.  Abdominal:     General: Abdomen is flat. Bowel sounds are normal.     Palpations: Abdomen is soft.  Musculoskeletal:        General: No swelling.     Cervical back: Normal range of motion and neck supple.     Comments: Unable to move LLE  Skin:    General: Skin is warm and dry.     Comments: Wound vac  Neurological:     General: No focal deficit present.     Mental Status: He is oriented to person, place, and time.  Psychiatric:        Mood and Affect: Mood normal.       Lab Results Lab Results  Component Value Date   WBC 22.3 (H) 09/03/2022   HGB 8.1 (L) 09/03/2022   HCT 24.9 (L) 09/03/2022   MCV 92.6 09/03/2022   PLT 181 09/03/2022    Lab Results  Component Value Date   CREATININE 2.59 (H) 09/03/2022   BUN 26 (H) 09/03/2022   NA 133 (L) 09/03/2022   K 3.7 09/03/2022   CL 95 (L) 09/03/2022   CO2 25 09/03/2022    Lab Results  Component Value Date   ALT <5 08/28/2022   AST 21 08/28/2022   ALKPHOS 122  08/28/2022   BILITOT 1.2 08/28/2022        Laurice Record, [employer] for Infectious [facility] 09/03/2022, 1:42 PM

## 2022-09-03 NOTE — Progress Notes (Signed)
   [facility] has been requested to perform a transesophageal echocardiogram on [facility] for bacteremia.  After careful review of history and examination, the risks and benefits of transesophageal echocardiogram have been explained including risks of esophageal damage, perforation (1:10,000 risk), bleeding, pharyngeal hematoma as well as other potential complications associated with conscious sedation including aspiration, arrhythmia, respiratory failure and death. Alternatives to treatment were discussed, questions were answered. Patient is willing to proceed.   NPO at [facility], scheduled for 09/04/22 at [place], [place]  09/03/2022 4:32 PM

## 2022-09-03 NOTE — Progress Notes (Signed)
NAME:  Albert Harris, MRN:  [id number], DOB:  10-09-1955, LOS: 6 ADMISSION DATE:  08/28/2022, CONSULTATION DATE:  08/28/22 REFERRING MD: Jeannine Kitten , CHIEF COMPLAINT:  Abdominal pain  History of Present Illness:   67 yo male brought to ER with abdominal pain, nausea, diaphoresis and unable to feel his Lt foot.  Hypotensive with lactic acidosis in ER.  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  Hx of ESRD and had hyperkalemia.  Had emergent CRRT.  Vascular consulted and taken to OR.    Pertinent  Medical History  ESRD -AV fistula (no flow tonight), PAD, COPD, HLD, HTN, HFrEF 35-40%, PAD  Significant Hospital Events: Including procedures, antibiotic start and stop dates in addition to other pertinent events   3/22 Admit, emergent CRRT, to OR for repair of distal Lt external iliac artery 3/24 Extubated. CT head obtained for altered mental status did not show any acute abnormalities 3/26 Off CRRT and pressors, Vascular surgery consulted for clotted AVG 3/27 AV graft thrombectomy attempted in OR but patient became hemodynamically unstable and procedure was aborted.  Was on pressors briefly  Interim History / Subjective:   AV graft thrombectomy attempted in OR but patient became hemodynamically unstable and procedure was aborted.  Was on pressors briefly.  Underwent hemodialysis today morning  Objective   Blood pressure (!) 69/52, pulse 96, temperature 98.1 F (36.7 C), temperature source Oral, resp. rate (!) 22, height 5\' 8"  (1.727 m), weight 53.2 kg, SpO2 97 %.        Intake/Output Summary (Last 24 hours) at 09/03/2022 0801 Last data filed at 09/03/2022 0700 Gross per 24 hour  Intake 1692.28 ml  Output 2001 ml  Net -308.72 ml   Filed Weights   08/31/22 0400 09/01/22 0430 09/02/22 0342  Weight: 65.5 kg 61.5 kg 53.2 kg    Examination: Blood pressure (!) 69/52, pulse 96, temperature 98.1 F (36.7 C), temperature source Oral, resp. rate (!) 22, height 5\' 8"   (1.727 m), weight 53.2 kg, SpO2 97 %. Gen:      No acute distress HEENT:  EOMI, sclera anicteric Neck:     No masses; no thyromegaly Lungs:    Clear to auscultation bilaterally; normal respiratory effort CV:         Regular rate and rhythm; no murmurs Abd:      + bowel sounds; soft, non-tender; no palpable masses, no distension, improved abd wall hematoma Ext:    No edema; adequate peripheral perfusion Skin:      Warm and dry; no rash Neuro: Awake, mild confusion  Labs/imaging reviewed Significant for sodium 132, creatinine 3.92, BUN 59 WBC improved to 22.3, hemoglobin 8.1, platelets 181   Resolved Hospital Problem list   Anion gap metabolic acidosis with lactic acidosis, Hyperkalemia  Assessment & Plan:  MRSA blood cultures On Vancomycin Monitor fevers and leukocytosis ID is on board Will need TEE when able to tolerate it  Lt common femoral artery pseudoaneurysm s/p Lt groin hematoma evacuation, primary repair of distal external iliac artery. Continue heparin drip.   Postop care per vascular surgery  ESRD. Finished HD today.  Will remove IJ HD catheter and A-line to give line holiday His AV graft is still nonfunctional and will likely need tunneled catheter on Monday If he decompensates over the weekend then we will place back a temporary HD catheter.  Acute respiratory failure 2nd to acidosis and uremia. Hx of COPD. Stable postextubation.  Follow intermittent chest x-ray  Anemia of  critical illness and chronic disease. Follow CBC.  Transfuse for hemoglobin less than 7  Chronic systolic CHF with EF 35 to 40% on Echo from 06/29/22. Hx of pulmonary hypertension. Hold outpt toprol, plavix  Acute metabolic encephalopathy from uremia, acidosis. Monitor Family if concerned about his confusion. CT head on 3/24 was normal. Order MRI  Best Practice (right click and "Reselect all SmartList Selections" daily)   Diet/type: tubefeeds DVT prophylaxis: systemic heparin  GI  prophylaxis: PPI. Lines: Dialysis Catheter and Arterial Line. Remove a line and dialysis cath Foley:  N/A Code Status:  full code Last date of multidisciplinary goals of care discussion [x]   Critical care time:    The patient is critically ill with multiple organ system failure and requires high complexity decision making for assessment and support, frequent evaluation and titration of therapies, advanced monitoring, review of radiographic studies and interpretation of complex data.   Critical Care Time devoted to patient care services, exclusive of separately billable procedures, described in this note is 35 minutes.   Marshell Garfinkel MD [facility] See Amion for pager  If no response to pager , please call [phone] until 7pm After 7:00 pm call Elink  [phone] 09/03/2022, 8:01 AM

## 2022-09-03 NOTE — Progress Notes (Signed)
Opened in error-pt is inpt.

## 2022-09-03 NOTE — Progress Notes (Addendum)
ANTICOAGULATION CONSULT NOTE Pharmacy Consult for Heparin Indication:  lower extremity limb ischemia  Allergies  Allergen Reactions   Benadryl [Diphenhydramine] Rash   Lidocaine-Prilocaine Rash    Caused red marks up and down arm    Patient Measurements: Height: 5\' 8"  (172.7 cm) Weight: 53.2 kg (117 lb 4.6 oz) IBW/kg (Calculated) : 68.4 Heparin Dosing Weight: 64.4 kg  Vital Signs: Temp: 97.8 F (36.6 C) (03/28 1137) Temp Source: Oral (03/28 1137) BP: 98/79 (03/28 1014) Pulse Rate: 91 (03/28 1014)  Labs: Recent Labs    09/01/22 0340 09/01/22 0557 09/01/22 1730 09/01/22 2042 09/02/22 0424 09/02/22 1505 09/03/22 0624 09/03/22 0952  HGB 7.5*   < >  --   --  7.4* 9.2* 8.1*  --   HCT 23.7*   < >  --   --  22.4* 27.0* 24.9*  --   PLT 182  --   --   --  180  --  181  --   HEPARINUNFRC  --    < >  --  0.19* 0.23*  --   --  0.41  CREATININE 2.21*  --  3.19*  --  3.92*  --   --  2.59*   < > = values in this interval not displayed.    Assessment: 67 yr old male with hx LLE thromboembolectomy s/p left groin pseudoaneurysm repair and hematoma evacuation for heparin.   Underwent R arm AV loop graft embolectomy 3/27 - discussed with Dr Virl Cagey and plan to restart heparin in 4 hours. Was previously on heparin infusion at 2100 units/hr prior to procedure. Of note, did receive 10,000 units during procedure.  Heparin level therapeutic at 0.41. Hgb 8.1, PLT 181 stable. No issues with heparin infusion or s/sx bleeding noted. Hematoma noted to be improved today.   Goal of Therapy:  Heparin level 0.3-0.5 units/ml  Monitor platelets by anticoagulation protocol: Yes   Plan:  Continue heparin infusion at 2100 units/hr Check 8 hour confirmatory heparin level  Check heparin level daily, CBC daily  Monitor for signs and symptoms of bleeding Closely monitor hematoma   Eliseo Gum, PharmD PGY1 Pharmacy Resident   09/03/2022  12:33 PM

## 2022-09-03 NOTE — Progress Notes (Addendum)
Progress Note    09/03/2022 6:48 AM 1 Day Post-Op  Subjective:  c/o right heel pain today  Tm 99 now afebrile  Vitals:   09/03/22 0618 09/03/22 0630  BP:    Pulse: 84 89  Resp: (!) 25 18  Temp:    SpO2: 97% 92%    Physical Exam: General:  no distress Cardiac:  regular Lungs:  non labored Incisions:  left groin with Prevena with good seal; left arm incision looks fine. Extremities:  +doppler flow right AT/PT/pero; right heel tender and mild redness   CBC    Component Value Date/Time   WBC 28.7 (H) 09/02/2022 0424   RBC 2.32 (L) 09/02/2022 0424   HGB 9.2 (L) 09/02/2022 1505   HGB 10.4 (L) 06/18/2021 1431   HCT 27.0 (L) 09/02/2022 1505   HCT 31.6 (L) 06/18/2021 1431   PLT 180 09/02/2022 0424   PLT 142 (L) 06/18/2021 1431   MCV 96.6 09/02/2022 0424   MCV 96 06/18/2021 1431   MCH 31.9 09/02/2022 0424   MCHC 33.0 09/02/2022 0424   RDW 18.9 (H) 09/02/2022 0424   RDW 14.3 06/18/2021 1431   LYMPHSABS 1.5 08/28/2022 1810   LYMPHSABS 1.5 06/18/2021 1431   MONOABS 0.7 08/28/2022 1810   EOSABS 0.1 08/28/2022 1810   EOSABS 1.0 (H) 06/18/2021 1431   BASOSABS 0.1 08/28/2022 1810   BASOSABS 0.1 06/18/2021 1431    BMET    Component Value Date/Time   NA 132 (L) 09/02/2022 1505   NA 141 06/18/2021 1431   K 5.0 09/02/2022 1505   CL 98 09/02/2022 0424   CO2 21 (L) 09/02/2022 0424   GLUCOSE 102 (H) 09/02/2022 0424   BUN 59 (H) 09/02/2022 0424   BUN 20 06/18/2021 1431   CREATININE 3.92 (H) 09/02/2022 0424   CREATININE 1.15 02/19/2017 1049   CALCIUM 8.2 (L) 09/02/2022 0424   CALCIUM 8.5 (L) 06/10/2020 0900   GFRNONAA 16 (L) 09/02/2022 0424   GFRAA 6 (L) 03/04/2020 1045    INR    Component Value Date/Time   INR 1.2 08/12/2022 1501     Intake/Output Summary (Last 24 hours) at 09/03/2022 0648 Last data filed at 09/03/2022 0600 Gross per 24 hour  Intake 1832.72 ml  Output 2001 ml  Net -168.28 ml      Assessment/Plan:  67 y.o. male is s/p:  femoral  exploration, pseudoaneurysm repair   6 Days Post-Op Thrombectomy of right FA AVG  1 Day Post-Op   -left groin incision with prevena vac with good seal.  Left foot unchanged and no c/o pain. -pt does c/o right heel pain.  He has +doppler flow right AT/PT/pero.  Right heel is tender to touch and mild redness.  Continue to float heels off bed.  -unfortunately, pt required pressors post operatively with drop of BP to [id number] systolic.  His graft was found to be thrombosed.  After 2 attempts in OR for thrombectomy, there are no further plans to return to OR and pt will need dialysis then line holiday and need work up for new access once he is doing better clinically.  Right arm incision looks fine.  -leukocytosis continues to improve (down to 22k).  Abx for MRSA bacteremia.  Repeat blood cx yesterday no growth thus far x 1 day. -DVT prophylaxis:  heparin gtt   Leontine Locket, PA-C [facility] [phone] 09/03/2022 6:48 AM  VASCULAR STAFF ADDENDUM: I have independently interviewed and examined the patient. I agree with the above.  Mental status significantly improved No pressors Dialysis today-plan for IJ removal. Continue pain in the lower extremities, left greater than right.  No wounds Right AV graft occluded.  Plan for line holiday.  Patient will require new HD access over the weekend versus Monday pending electrolytes. Will preemptively book for Monday tunneled dialysis catheter placement. Left groin dressing down Friday night v Saturday (whenever the Prevena stops) Needs OOB, aggressive physical therapy   Cassandria Santee, MD [facility] Phone Number: [phone] 09/03/2022 1:06 PM

## 2022-09-03 NOTE — Progress Notes (Signed)
ANTICOAGULATION CONSULT NOTE Pharmacy Consult for Heparin Indication:  lower extremity limb ischemia  Allergies  Allergen Reactions   Benadryl [Diphenhydramine] Rash   Lidocaine-Prilocaine Rash    Caused red marks up and down arm    Patient Measurements: Height: 5\' 8"  (172.7 cm) Weight: 53.2 kg (117 lb 4.6 oz) IBW/kg (Calculated) : 68.4 Heparin Dosing Weight: 64.4 kg  Vital Signs: Temp: 98.4 F (36.9 C) (03/28 1934) Temp Source: Oral (03/28 1934) BP: 117/63 (03/28 1930) Pulse Rate: 77 (03/28 1930)  Labs: Recent Labs    09/01/22 0340 09/01/22 0557 09/01/22 1730 09/01/22 2042 09/02/22 0424 09/02/22 1505 09/03/22 0624 09/03/22 0952 09/03/22 1858  HGB 7.5*   < >  --   --  7.4* 9.2* 8.1*  --   --   HCT 23.7*   < >  --   --  22.4* 27.0* 24.9*  --   --   PLT 182  --   --   --  180  --  181  --   --   HEPARINUNFRC  --    < >  --    < > 0.23*  --   --  0.41 0.33  CREATININE 2.21*  --  3.19*  --  3.92*  --   --  2.59*  --    < > = values in this interval not displayed.    Assessment: 67 yr old male with hx LLE thromboembolectomy s/p left groin pseudoaneurysm repair and hematoma evacuation for heparin.   Underwent R arm AV loop graft embolectomy 3/27 - discussed with Dr Virl Cagey and plan to restart heparin in 4 hours. Was previously on heparin infusion at 2100 units/hr prior to procedure. Of note, did receive 10,000 units during procedure.  Heparin level therapeutic at 0.33, previous level also therapeutic at 0.41. Hgb 8.1, PLT 181 stable. No issues with heparin infusion or s/sx bleeding noted. Hematoma noted to be improved today.   Goal of Therapy:  Heparin level 0.3-0.5 units/ml  Monitor platelets by anticoagulation protocol: Yes   Plan:  Continue heparin infusion at 2100 units/hr Space heparin level to daily, CBC daily.  Monitor for signs and symptoms of bleeding Closely monitor hematoma   Esmeralda Arthur, PharmD, BCCCP  09/03/2022  8:36 PM

## 2022-09-03 NOTE — Consult Note (Signed)
Physical Medicine and Rehabilitation Consult Reason for Consult: evaluate appropriateness for IPR admission Referring Physician: Dr. Vaughan Browner   HPI: Albert Harris is a 67 y.o. male with PMHx of  has a past medical history of Acute metabolic encephalopathy (XX123456), Acute on chronic diastolic CHF (congestive heart failure) ([place]) (12/15/2020), Acute respiratory disease due to COVID-19 virus (06/16/2020), Alcohol use disorder (06/30/2022), Anemia, Asthma, Bacteremia due to Gram-positive bacteria (05/09/2021), Carotid stenosis (09/12/2019), CHF (congestive heart failure) ([place]), Claudication in peripheral vascular disease ([place]) (123456), Complication of anesthesia, COPD (chronic obstructive pulmonary disease) ([place]), COVID-19, Critical limb ischemia of right lower extremity ([place]), ESRD on hemodialysis ([place]), GERD (gastroesophageal reflux disease), HLD (hyperlipidemia), Hypertension, Hypertensive urgency (12/24/2016), Lower GI bleed, Nausea vomiting and diarrhea (08/13/2022), Peripheral vascular disease ([place]), PFO with atrial septal aneurysm, Pneumonia, Stroke ([place]), Surgical wound infection (09/20/2021), [name] (transient ischemic attack) (11/2017), Tobacco abuse, and Volume overload (12/14/2020). . 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 Donzetta Matters .  PM&R was consulted to evaluate appropriateness for IPR admission.   Patient seen and evaluated, disoriented, believes he is at his granddaughters birthday party.  Not agitated, follows simple commands.  Daughter at bedside, recounts that patient has had several recent hospitalizations and likes to avoid medical care when at all possible.  He is generally a fairly stubborn person, and needs considerable encouragement from his wife and children to maintain compliance with outpatient dialysis, medical checkups, and even emergency  evaluations.  Since admission, he has started to cognitively improve, although things have been slow going with persistent positive blood cultures necessitating a right AV graft embolectomy, resulting in no current access for dialysis. He is awaiting a TEE and has a dialysis port placement 4/1.   Regarding function, he is generally independent without use of an assistive device, lives in a two-story home with a single floor set up with his wife, 5 steps to enter with a handrail.  His wife is incapable of physically assisting him, however he has multiple family members in the area that can check on him and plan for him to go home with intermittent home health nursing to assist.  He participates in therapies per notes but remains total or max assist +2 for bed rolling, edge of bed, and all functional activities.   Unable to obtain ROS due to patient cognitive status.    Past Medical History:  Diagnosis Date   Acute metabolic encephalopathy [id number]   Acute on chronic diastolic CHF (congestive heart failure) ([place]) 12/15/2020   Acute respiratory disease due to COVID-19 virus 06/16/2020   Alcohol use disorder 06/30/2022   Anemia    Asthma    Bacteremia due to Gram-positive bacteria 05/09/2021   Carotid stenosis 09/12/2019   CHF (congestive heart failure) (HCC)    Claudication in peripheral vascular disease ([place]) [id number]   Complication of anesthesia    " i HAVE A HARD TIME WAKING UP " (only one time)   COPD (chronic obstructive pulmonary disease) ([place])    COVID-19    positive on  06/16/20   Critical limb ischemia of right lower extremity (HCC)    ESRD on hemodialysis ([place])    t, th. sat dialysis   GERD (gastroesophageal reflux disease)    HLD (hyperlipidemia)    Hypertension    Hypertensive urgency 12/24/2016   Lower GI bleed    Nausea vomiting and diarrhea  08/13/2022   Peripheral vascular disease ([place])    PFO with atrial septal aneurysm    by echo 11/2017   Pneumonia    Stroke [facility])     Surgical wound infection 09/20/2021   [name] (transient ischemic attack) 11/2017   Tobacco abuse    quit 07/17/19   Volume overload 12/14/2020   Past Surgical History:  Procedure Laterality Date   ABDOMINAL AORTOGRAM W/LOWER EXTREMITY N/A 06/21/2020   Procedure: ABDOMINAL AORTOGRAM W/LOWER EXTREMITY;  Surgeon: Angelia Mould, MD;  Location: [place] [facility];  Service: Cardiovascular;  Laterality: N/A;   ABDOMINAL AORTOGRAM W/LOWER EXTREMITY Right 08/29/2021   Procedure: ABDOMINAL AORTOGRAM W/LOWER EXTREMITY;  Surgeon: Cherre Robins, MD;  Location: [place] [facility];  Service: Cardiovascular;  Laterality: Right;   ABDOMINAL AORTOGRAM W/LOWER EXTREMITY N/A 08/10/2022   Procedure: ABDOMINAL AORTOGRAM W/LOWER EXTREMITY;  Surgeon: Angelia Mould, MD;  Location: [place] [facility];  Service: Cardiovascular;  Laterality: N/A;   AORTOGRAM N/A 08/22/2022   Procedure: AORTOGRAM;  Surgeon: Waynetta Sandy, MD;  Location: [place];  Service: Vascular;  Laterality: N/A;   APPLICATION OF WOUND VAC Right 09/21/2021   Procedure: APPLICATION OF WOUND VAC;  Surgeon: Angelia Mould, MD;  Location: [place];  Service: Vascular;  Laterality: Right;   APPLICATION OF WOUND VAC Left 08/28/2022   Procedure: APPLICATION OF [place] WITH PLACEMENT OF [place];  Surgeon: Broadus John, MD;  Location: [place];  Service: Vascular;  Laterality: Left;   ARTERY REPAIR Left 08/28/2022   Procedure: LEFT EXTERNAL ILIAC ARTERY REPAIR;  Surgeon: Broadus John, MD;  Location: [place];  Service: Vascular;  Laterality: Left;   AV FISTULA PLACEMENT Left 03/28/2020   Procedure: ARTERIOVENOUS (AV) FISTULA CREATION LEFT;  Surgeon: [name], MD;  Location: [place];  Service: Vascular;  Laterality: Left;   AV FISTULA PLACEMENT Right 09/23/2020   Procedure: RIGHT Arm ARTERIOVENOUS GRAFT CREATION.;  Surgeon: Angelia Mould, MD;  Location: [place];  Service: Vascular;  Laterality:  Right;   COLONOSCOPY     COLONOSCOPY WITH PROPOFOL N/A 06/27/2020   Procedure: COLONOSCOPY WITH PROPOFOL;  Surgeon: Carol Ada, MD;  Location: [place];  Service: Endoscopy;  Laterality: N/A;   DRAINAGE AND CLOSURE OF LYMPHOCELE Right 09/21/2021   Procedure: EVACUATION OF LYMPHOCELE RIGHT GROIN;  Surgeon: Angelia Mould, MD;  Location: [place];  Service: Vascular;  Laterality: Right;   ENDARTERECTOMY FEMORAL Right 09/05/2021   Procedure: ENDARTERECTOMY FEMORAL;  Surgeon: Angelia Mould, MD;  Location: [place];  Service: Vascular;  Laterality: Right;   FEMORAL-POPLITEAL BYPASS GRAFT Right 09/05/2021   Procedure: RIGHT FEMORAL-POPLITEAL ARTERY BYPASS WITH Spaphenous Vein;  Surgeon: Angelia Mould, MD;  Location: [facility];  Service: Vascular;  Laterality: Right;   [place]  06/27/2020   Procedure: HEMOSTASIS CLIP PLACEMENT;  Surgeon: Carol Ada, MD;  Location: [place];  Service: Endoscopy;;   INSERTION OF DIALYSIS CATHETER Right 06/24/2020   Procedure: INSERTION OF TUNNELED  DIALYSIS CATHETER; Removal of temporary dialysis catheter right neck;  Surgeon: Angelia Mould, MD;  Location: [place];  Service: Vascular;  Laterality: Right;   INSERTION OF ILIAC STENT Left 08/22/2022   Procedure: INSERTION OF ILIAC STENT;  Surgeon: Waynetta Sandy, MD;  Location: [place];  Service: Vascular;  Laterality: Left;   IR PARACENTESIS  06/30/2022   LIGATION OF ARTERIOVENOUS  FISTULA Left 07/24/2020   Procedure: LIGATION OF  LEFT BRACHIOCEPHALIC ARTERIOVENOUS  FISTULA;  Surgeon: [name], MD;  Location: [place];  Service: Vascular;  Laterality: Left;   PERIPHERAL VASCULAR INTERVENTION Right 06/21/2020   Procedure: PERIPHERAL VASCULAR INTERVENTION;  Surgeon: Angelia Mould, MD;  Location: [place] [facility];  Service: Cardiovascular;  Laterality: Right;   PERIPHERAL VASCULAR INTERVENTION Left 08/10/2022   Procedure: PERIPHERAL  VASCULAR INTERVENTION;  Surgeon: Angelia Mould, MD;  Location: [place] [facility];  Service: Cardiovascular;  Laterality: Left;  External Illiac   REVISION OF ARTERIOVENOUS GORETEX GRAFT Right 09/02/2022   Procedure: REVISION OF RIGHT ARTERIOVENOUS GORETEX GRAFT;  Surgeon: Broadus John, MD;  Location: [place];  Service: Vascular;  Laterality: Right;   TEE WITHOUT CARDIOVERSION N/A 05/13/2021   Procedure: TRANSESOPHAGEAL ECHOCARDIOGRAM (TEE);  Surgeon: Donato Heinz, MD;  Location: [place];  Service: Cardiovascular;  Laterality: N/A;   THROMBECTOMY FEMORAL ARTERY Left 08/22/2022   Procedure: AORTOILIAC THROMBECTOMY, COMMON FEMORAL ENDARTERECTOMY,;  Surgeon: Waynetta Sandy, MD;  Location: [place];  Service: Vascular;  Laterality: Left;   VEIN HARVEST Right 09/05/2021   Procedure: RIGHT Saphenous VEIN HARVEST;  Surgeon: Angelia Mould, MD;  [facility];  Service: Vascular;  Laterality: Right;   Family History  Problem Relation Age of Onset   Hypertension Mother    Colon cancer Father    Stomach cancer Brother    Social History:  reports that he quit smoking about 3 years ago. His smoking use included cigarettes. He has a 50.00 pack-year smoking history. He has never used smokeless tobacco. He reports that he does not currently use alcohol after a past usage of about 1.0 - 3.0 standard drink of alcohol per week. He reports that he does not use drugs. Allergies:  Allergies  Allergen Reactions   Benadryl [Diphenhydramine] Rash   Lidocaine-Prilocaine Rash    Caused red marks up and down arm   Facility-Administered Medications Prior to Admission  Medication Dose Route Frequency Provider Last Rate Last Admin   [DISCONTINUED] sodium chloride flush (NS) 0.9 % injection 3 mL  3 mL Intravenous Q12H Angelia Mould, MD       Medications Prior to Admission  Medication Sig Dispense Refill   albuterol (VENTOLIN HFA) 108 (90 Base) MCG/ACT inhaler INHALE  1-2 PUFFS INTO THE LUNGS EVERY 6 (SIX) HOURS AS NEEDED FOR WHEEZING OR SHORTNESS OF BREATH. 8 g 0   aspirin EC 81 MG tablet Take 1 tablet (81 mg total) by mouth daily. Swallow whole. (Patient not taking: Reported on 08/22/2022) 150 tablet 1   B Complex-C-Zn-Folic Acid (DIALYVITE/ZINC) TABS Take 1 tablet by mouth daily. (Patient not taking: Reported on 08/22/2022)     calcium acetate (PHOSLO) 667 MG capsule Take 2 capsule by mouth three times a day with meals (Patient taking differently: Take 1,334 mg by mouth 3 (three) times daily with meals.) 540 capsule 3   clopidogrel (PLAVIX) 75 MG tablet Take 1 tablet (75 mg total) by mouth daily. 30 tablet 0   hydrOXYzine (ATARAX) 10 MG tablet Take 10 mg by mouth 3 (three) times daily.     metoprolol succinate (TOPROL-XL) 25 MG 24 hr tablet Take 1 tablet (25 mg total) by mouth daily. 30 tablet 0   rosuvastatin (CRESTOR) 10 MG tablet Take 1 tablet (10 mg total) by mouth daily. (Patient not taking: Reported on 08/13/2022) 30 tablet 11    Home: [place] expects to be discharged to:: Private residence Living Arrangements: Spouse/significant other Available Help at  Discharge: Family, Available 24 hours/day Type of Home: House Home Access: Stairs to enter [employer] of Steps: 5 Entrance Stairs-Rails: Right Home Layout: Two level, Able to live on main level with bedroom/bathroom Alternate Level Stairs-Rails:  (unsure) Bathroom Shower/Tub: Chiropodist: Standard Bathroom Accessibility: Yes Home Equipment: Wheelchair - manual, Hand held shower head, Standard Walker Additional Comments: Daughter gave pts info as she was present in room.  Functional History: Prior Function Prior Level of Function : Independent/Modified Independent Mobility Comments: ambulates without AD, denies falls. ADLs Comments: independent ADLs, limited IADLs, spouse drives and helps pt with socks. Wife assists with dishwashing Functional  Status:  Mobility: Bed Mobility Overal bed mobility: Needs Assistance Bed Mobility: Rolling, Supine to Sit, Sit to Supine Rolling: Max assist, +2 for safety/equipment, +2 for physical assistance Supine to sit: Max assist, +2 for physical assistance, +2 for safety/equipment Sit to supine: Total assist, +2 for physical assistance, +2 for safety/equipment General bed mobility comments: assist for trunk and LE management, scooting to/from EOB, rolling bilat for pericare as pt soiled in stool. Transfers Overall transfer level: Needs assistance Equipment used: 2 person hand held assist Transfers: Sit to/from Stand Sit to Stand: Max assist, +2 safety/equipment, +2 physical assistance General transfer comment: brief standing EOB attempt, heavy bed pad use for power up      ADL: ADL Overall ADL's : Needs assistance/impaired Eating/Feeding: NPO General ADL Comments: dependent for self care at bed level this date  Cognition: Cognition Overall Cognitive Status: Impaired/Different from baseline Orientation Level: Oriented to person, Oriented to place, Oriented to time, Disoriented to situation Cognition Arousal/Alertness: Awake/alert Behavior During Therapy: WFL for tasks assessed/performed Overall Cognitive Status: Impaired/Different from baseline Area of Impairment: Memory, Following commands, Safety/judgement, Awareness, Attention, Orientation, Problem solving Orientation Level: Disoriented to, Situation, Time Current Attention Level: Focused Memory: Decreased recall of precautions, Decreased short-term memory Following Commands: Follows one step commands inconsistently Safety/Judgement: Decreased awareness of deficits, Decreased awareness of safety Awareness: Intellectual Problem Solving: Decreased initiation, Slow processing, Requires verbal cues, Difficulty sequencing General Comments: Able to state name and hospital with increased time, identified dtr in the room on 2nd attempt.  Otherwise seemingly confused, followed simple commands with cues and time  Blood pressure (!) 124/56, pulse 81, temperature 97.8 F (36.6 C), temperature source Oral, resp. rate (!) 33, height 5\' 8"  (1.727 m), weight 53.2 kg, SpO2 95 %. Physical Exam   PE: Constitution: Appropriate appearance for age. No apparent distress. Laying in bed.  Resp: No respiratory distress. No accessory muscle usage. On RA. CTAB Cardio: RRR. Well perfused appearance. No peripheral edema. Abdomen: Nondistended. Nontender.   Skin: Wound VAC on left groin, no active drainage.  + 2 L upper extremity IVs., c./d/i + Right upper extremity AV fistula.  +Coccyx PI, covered  Psych: Flat affect. Neuro: Oriented to self only.  Insist place as his granddaughter's house, even with options and cueing.  No obvious hallucinations or delusions.  Cannot perform higher level cognitive tasks.  Can follow 1 and two-step directions consistently, and answer yes/no questions appropriately.   Neurologic Exam:   DTRs: Reflexes were 2+ in bilateral achilles, patella, biceps, BR and triceps. Babinsky: flexor responses b/l.   Hoffmans: negative b/l Sensory exam: revealed normal sensation in all dermatomal regions in bilateral upper extremities and bilateral lower extremities Motor exam: strength 5/5 throughout bilateral upper extremities, right lower extremity, and with exception of left lower extremity trace 1/5 activation of the hip flexors, knee extensors, and plantar flexors; 0  out of 5 dorsiflexion.  Coordination: Fine motor coordination was normal in bilateral upper extremities.  No ataxia with finger-nose.   Results for orders placed or performed during the hospital encounter of 08/28/22 (from the past 24 hour(s))  I-STAT 7, (LYTES, BLD GAS, ICA, H+H)     Status: Abnormal   Collection Time: 09/02/22  3:05 PM  Result Value Ref Range   pH, Arterial 7.422 7.35 - 7.45   pCO2 arterial 29.0 (L) 32 - 48 mmHg   pO2, Arterial 95 83 -  108 mmHg   Bicarbonate 18.7 (L) 20.0 - 28.0 mmol/L   TCO2 20 (L) 22 - 32 mmol/L   O2 Saturation 97 %   Acid-base deficit 5.0 (H) 0.0 - 2.0 mmol/L   Sodium 132 (L) 135 - 145 mmol/L   Potassium 5.0 3.5 - 5.1 mmol/L   Calcium, Ion 1.14 (L) 1.15 - 1.40 mmol/L   HCT 27.0 (L) 39.0 - 52.0 %   Hemoglobin 9.2 (L) 13.0 - 17.0 g/dL   Patient temperature 38.0 C    Sample type ARTERIAL   Transfusion reaction     Status: None   Collection Time: 09/02/22  3:15 PM  Result Value Ref Range   Post RXN DAT IgG NEG    DAT C3 NEG    Path interp tx rxn      null Performed at [facility], [place] [address], [place], [place] [address]   Culture, blood (Routine X 2) w Reflex to ID Panel     Status: None (Preliminary result)   Collection Time: 09/02/22  4:44 PM   Specimen: BLOOD LEFT HAND  Result Value Ref Range   Specimen Description BLOOD LEFT HAND    Special Requests      BOTTLES DRAWN AEROBIC AND ANAEROBIC Blood Culture results may not be optimal due to an inadequate volume of blood received in culture bottles   Culture      NO GROWTH < 24 HOURS Performed at [facility] [address] [place], [place] [address]    Report Status PENDING   Culture, blood (Routine X 2) w Reflex to ID Panel     Status: None (Preliminary result)   Collection Time: 09/02/22  4:45 PM   Specimen: BLOOD LEFT HAND  Result Value Ref Range   Specimen Description BLOOD LEFT HAND    Special Requests      BOTTLES DRAWN AEROBIC AND ANAEROBIC Blood Culture adequate volume   Culture      NO GROWTH < 24 HOURS Performed at [facility], [place] [address] [place], [place] [address]    Report Status PENDING   Lactic acid, plasma     Status: None   Collection Time: 09/02/22  4:59 PM  Result Value Ref Range   Lactic Acid, Venous 1.0 0.5 - 1.9 mmol/L  CBC     Status: Abnormal   Collection Time: 09/03/22  6:24 AM  Result Value Ref Range   WBC 22.3 (H) 4.0 - 10.5 K/uL   RBC 2.69 (L) 4.22 - 5.81 MIL/uL    Hemoglobin 8.1 (L) 13.0 - 17.0 g/dL   HCT 24.9 (L) 39.0 - 52.0 %   MCV 92.6 80.0 - 100.0 fL   MCH 30.1 26.0 - 34.0 pg   MCHC 32.5 30.0 - 36.0 g/dL   RDW 19.2 (H) 11.5 - 15.5 %   Platelets 181 150 - 400 K/uL   nRBC 0.1 0.0 - 0.2 %  Renal function panel (  daily at 0500)     Status: Abnormal   Collection Time: 09/03/22  9:52 AM  Result Value Ref Range   Sodium 133 (L) 135 - 145 mmol/L   Potassium 3.7 3.5 - 5.1 mmol/L   Chloride 95 (L) 98 - 111 mmol/L   CO2 25 22 - 32 mmol/L   Glucose, Bld 95 70 - 99 mg/dL   BUN 26 (H) 8 - 23 mg/dL   Creatinine, Ser 2.59 (H) 0.61 - 1.24 mg/dL   Calcium 7.8 (L) 8.9 - 10.3 mg/dL   Phosphorus 2.7 2.5 - 4.6 mg/dL   Albumin 1.9 (L) 3.5 - 5.0 g/dL   GFR, Estimated 26 (L) >60 mL/min   Anion gap 13 5 - 15  Heparin level (unfractionated)     Status: None   Collection Time: 09/03/22  9:52 AM  Result Value Ref Range   Heparin Unfractionated 0.41 0.30 - 0.70 IU/mL   No results found.  Assessment/Plan: Diagnosis: Debility due to infected left femoral pseudoaneurysm, status post repair  Does the need for close, 24 hr/day medical supervision in concert with the patient's rehab needs make it unreasonable for this patient to be served in a less intensive setting? Potentially Co-Morbidities requiring supervision/potential complications: Sepsis due to infected left femoral pseudoaneurysm, ESRD on HD (awaiting port placement), s/p L AV fistula embolectomy and L femoral pseudoaneurysm repair on heparin drip, uncontrolled hypertension, tachypnea, tachycardia, and poor pain control  Due to bladder management, bowel management, safety, skin/wound care, disease management, medication administration, pain management, and patient education, does the patient require 24 hr/day rehab nursing? Yes Does the patient require coordinated care of a physician, rehab nurse, therapy disciplines of PT, OT, and SLP to address physical and functional deficits in the context of the above medical  diagnosis(es)? Yes Addressing deficits in the following areas: balance, endurance, locomotion, strength, transferring, bowel/bladder control, bathing, dressing, feeding, grooming, toileting, cognition, speech, and language Can the patient actively participate in an intensive therapy program of at least 3 hrs of therapy per day at least 5 days per week? Potentially The potential for patient to make measurable gains while on inpatient rehab is fair Anticipated functional outcomes upon discharge from inpatient rehab are supervision  with PT, supervision with OT, supervision with SLP. Estimated rehab length of stay to reach the above functional goals is: 18-21 days Anticipated discharge destination: Home Overall Rehab/Functional Prognosis: fair  POST ACUTE RECOMMENDATIONS: This patient's condition is appropriate for continued rehabilitative care in the following setting: TBD, SNF vs. IRF. At current level, patient would be more appropriate for SNF as he is Max Ax2 and Total A for all mobility with PT/OT and would not tolerate 3 hours therapy per day. However, per family cognition and movement in LLE is improving, and he is currently awaiting ongoing medical workup (TEE, MRI of brain and spine and HD access). He may be appropriate for IRF if therapy evaluations improve over the next few days.   Patient has agreed to participate in recommended program. N/A, patient does not currently have decision making capacity. Primary medical decision maker must be agreeable for IPR admission.  Note that insurance prior authorization may be required for reimbursement for recommended care.  Comment: Family with good dispo plan for Mr. Yero, home with wife and multiple children nearby, plan for [facility] and nursing aide. He is high risk for post-rehab readmission due to Hx missing outpatient dialysis and has already had 5 hospital admissions this year.    MEDICAL RECOMMENDATIONS: Once  medically stable, repeat PT, OT, and  SLP evaluations to see if better able to tolerate 3 hours therapy per day for IRF Complete pending MRI brain and spine once dialysis access established to look for possible emboli or structural causes for ongoing AMS Will need plan for transition off of heparin drip and IV pain medication approaching rehab admission   I have personally performed a face to face diagnostic evaluation of this patient. Additionally, I have examined the patient's medical record including any pertinent labs and radiographic images. If the physician assistant has documented in this note, I have reviewed and edited or otherwise concur with the physician assistant's documentation.  Thanks,  Gertie Gowda, DO 09/03/2022

## 2022-09-04 ENCOUNTER — Other Ambulatory Visit (HOSPITAL_COMMUNITY): Payer: Medicare Other

## 2022-09-04 ENCOUNTER — Encounter (HOSPITAL_COMMUNITY): Payer: Self-pay | Admitting: Anesthesiology

## 2022-09-04 ENCOUNTER — Inpatient Hospital Stay (HOSPITAL_COMMUNITY): Payer: Medicare Other

## 2022-09-04 DIAGNOSIS — E872 Acidosis, unspecified: Secondary | ICD-10-CM | POA: Diagnosis not present

## 2022-09-04 LAB — RENAL FUNCTION PANEL
Albumin: 2.2 g/dL — ABNORMAL LOW (ref 3.5–5.0)
Anion gap: 16 — ABNORMAL HIGH (ref 5–15)
BUN: 48 mg/dL — ABNORMAL HIGH (ref 8–23)
CO2: 24 mmol/L (ref 22–32)
Calcium: 8.7 mg/dL — ABNORMAL LOW (ref 8.9–10.3)
Chloride: 94 mmol/L — ABNORMAL LOW (ref 98–111)
Creatinine, Ser: 4.21 mg/dL — ABNORMAL HIGH (ref 0.61–1.24)
GFR, Estimated: 15 mL/min — ABNORMAL LOW (ref >60)
Glucose, Bld: 90 mg/dL (ref 70–99)
Phosphorus: 4.2 mg/dL (ref 2.5–4.6)
Potassium: 3.7 mmol/L (ref 3.5–5.1)
Sodium: 134 mmol/L — ABNORMAL LOW (ref 135–145)

## 2022-09-04 LAB — CBC
HCT: 28.9 % — ABNORMAL LOW (ref 39.0–52.0)
Hemoglobin: 9.1 g/dL — ABNORMAL LOW (ref 13.0–17.0)
MCH: 30.3 pg (ref 26.0–34.0)
MCHC: 31.5 g/dL (ref 30.0–36.0)
MCV: 96.3 fL (ref 80.0–100.0)
Platelets: 216 10*3/uL (ref 150–400)
RBC: 3 MIL/uL — ABNORMAL LOW (ref 4.22–5.81)
RDW: 19.4 % — ABNORMAL HIGH (ref 11.5–15.5)
WBC: 19.4 10*3/uL — ABNORMAL HIGH (ref 4.0–10.5)
nRBC: 0 % (ref 0.0–0.2)

## 2022-09-04 LAB — MAGNESIUM: Magnesium: 2.3 mg/dL (ref 1.7–2.4)

## 2022-09-04 LAB — TROPONIN I (HIGH SENSITIVITY)
Troponin I (High Sensitivity): 146 ng/L (ref <18)
Troponin I (High Sensitivity): 134 ng/L (ref <18)

## 2022-09-04 LAB — HEPARIN LEVEL (UNFRACTIONATED): Heparin Unfractionated: 0.4 IU/mL (ref 0.30–0.70)

## 2022-09-04 MED ORDER — HYDROMORPHONE HCL 1 MG/ML IJ SOLN
0.5000 mg | Freq: Once | INTRAMUSCULAR | Status: AC
  Administered 2022-09-04: 0.5 mg via INTRAVENOUS
  Filled 2022-09-04: qty 0.5

## 2022-09-04 MED ORDER — HYDROMORPHONE HCL 1 MG/ML IJ SOLN
0.5000 mg | Freq: Three times a day (TID) | INTRAMUSCULAR | Status: DC | PRN
  Administered 2022-09-04 – 2022-09-05 (×2): 0.5 mg via INTRAVENOUS
  Filled 2022-09-04 (×3): qty 0.5

## 2022-09-04 NOTE — Progress Notes (Signed)
NAME:  Albert Harris, MRN:  [id number], DOB:  01/02/1956, LOS: 7 ADMISSION DATE:  08/28/2022, CONSULTATION DATE:  08/28/22 REFERRING MD: Jeannine Kitten , CHIEF COMPLAINT:  Abdominal pain  History of Present Illness:   67 yo male brought to ER with abdominal pain, nausea, diaphoresis and unable to feel his Lt foot.  Hypotensive with lactic acidosis in ER.  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  Hx of ESRD and had hyperkalemia.  Had emergent CRRT.  Vascular consulted and taken to OR.    Pertinent  Medical History  ESRD -AV fistula (no flow tonight), PAD, COPD, HLD, HTN, HFrEF 35-40%, PAD  Significant Hospital Events: Including procedures, antibiotic start and stop dates in addition to other pertinent events   3/22 Admit, emergent CRRT, to OR for repair of distal Lt external iliac artery 3/24 Extubated. CT head obtained for altered mental status did not show any acute abnormalities 3/26 Off CRRT and pressors, Vascular surgery consulted for clotted AVG 3/27 AV graft thrombectomy attempted in OR but patient became hemodynamically unstable and procedure was aborted.  Was on pressors briefly 3/28 dialysis followed by CVC and aline removal for line holiday  Interim History / Subjective:   S/p dialysis 3/28. CVC and aline removed for line holiday Scheduled for tunneled cath on 4/1 No complaints this morning  Objective   Blood pressure (!) 143/70, pulse 72, temperature 98.1 F (36.7 C), temperature source Oral, resp. rate 18, height 5\' 8"  (1.727 m), weight 56.6 kg, SpO2 100 %.        Intake/Output Summary (Last 24 hours) at 09/04/2022 0741 Last data filed at 09/04/2022 0600 Gross per 24 hour  Intake 608.11 ml  Output --  Net 608.11 ml   Filed Weights   09/01/22 0430 09/02/22 0342 09/04/22 0500  Weight: 61.5 kg 53.2 kg 56.6 kg   Physical Exam: General: Chronically ill-appearing, no acute distress HENT:  [place], AT, OP clear, MMM Eyes: EOMI, no scleral  icterus Respiratory: Clear to auscultation bilaterally.  No crackles, wheezing or rales Cardiovascular: RRR, -M/R/G, no JVD GI: BS+, soft, nontender, improved abdominal wall hematoma Extremities:-Edema,-tenderness Neuro: AAO x4, CNII-XII grossly intact Psych: Normal mood, normal affect  Electrolytes on BMET appropriate Improving leukocytosis   Resolved Hospital Problem list   Anion gap metabolic acidosis with lactic acidosis, Hyperkalemia  Assessment & Plan:  MRSA bacteremia 2/2 post-op wound infection --Continue Vanc. Following levels --Will need suppressive regimen after acute treatment --Appreciate ID involvement. Recommend line holiday x 72 hours starting 3/28 --Will discuss with ID regarding timing of L spine imaging due to contrast in setting of dialysis --Bcx 3/27 NGTD --TEE scheduled today  Lt common femoral artery pseudoaneurysm s/p Lt groin hematoma evacuation, primary repair of distal external iliac artery. --Continue heparin drip.   --Postop care per vascular surgery  ESRD. --Line holiday 3/28  --Plan for tunnelled cath 4/1 --If he decompensates over the weekend can return to ICU temporary HD catheter. Discussed with Nephrology and ok to transfer to floor  Acute respiratory failure 2nd to acidosis and uremia. - resolved Hx of COPD. --On room air  Anemia of critical illness and chronic disease. --Trend CBC. Transfuse for hemoglobin less than 7  Chronic systolic CHF with EF 35 to 40% on Echo from 06/29/22. Hx of pulmonary hypertension. --Hold outpt toprol, asa, plavix  Acute metabolic encephalopathy from uremia, acidosis. Monitor Family if concerned about his confusion. CT head on 3/24 was normal. --Consider MRI brain without contrast. Will time  with other scans  Best Practice (right click and "Reselect all SmartList Selections" daily)   Diet/type: NPO DVT prophylaxis: systemic heparin  GI prophylaxis: PPI. Lines: Dialysis Catheter and Arterial Line.  Remove a line and dialysis cath Foley:  N/A Code Status:  full code Last date of multidisciplinary goals of care discussion [x]   Critical care time:    Updated daughter 3/29 at bedside  The patient is critically ill with multiple organ systems failure and requires high complexity decision making for assessment and support, frequent evaluation and titration of therapies, application of advanced monitoring technologies and extensive interpretation of multiple databases.  Independent Critical Care Time: 37 Minutes.   Albert Harris, M.D. [facility] Pulmonary/Critical Care Medicine 09/04/2022 7:42 AM   Please see Amion for pager number to reach on-call Pulmonary and Critical Care Team.

## 2022-09-04 NOTE — Progress Notes (Signed)
  Inpatient Rehabilitation Admissions Coordinator   Met with patient and daughter at bedside for rehab assessment. Please also refer to Dr Francina Ames Rehab consult 3/28. Noted ongoing medical workup to include today's MRI brain and lumbar spine. We discussed goals and expectations of a possible CIR admit depending on his medical workup and abilities /tolerance for more intensive therapy. Too soon to determine final rehab venue dispo at this time. We will follow up on Monday. Please call me with any questions.   Danne Baxter, RN, MSN Rehab Admissions Coordinator [phone]

## 2022-09-04 NOTE — Progress Notes (Signed)
Occupational Therapy Treatment Patient Details Name: Albert Harris MRN: [id number] DOB: [birth year] Today's Date: 09/04/2022   History of present illness 67 year old male  ER with abdominal pain, nausea, diaphoresis and unable to feel his Lt foot. Hypotensive with lactic acidosis in ER.  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 CRRT 3/22-3/26.  3/22 repair of distal Lt external iliac artery. PMHx: PAD, ESRD HD, recently hospitalized 3/16-18 for acute on chronic left lower extremity ischemia.   OT comments  Pt making steady progress towards OT goals. Pt with improvements in cognition, able to consistently follow directions and respond quickly. Pt continues to be limited by LLE deficits and pain from sacral wound. Overall, pt able to complete bed mobility and transfers using RW with Mod A x 2. Pt continues to require extensive assist for LB ADLs due to deficits. Continue to recommend intensive rehab prior to DC home.   Recommendations for follow up therapy are one component of a multi-disciplinary discharge planning process, led by the attending physician.  Recommendations may be updated based on patient status, additional functional criteria and insurance authorization.    Assistance Recommended at Discharge Intermittent Supervision/Assistance  Patient can return home with the following  Direct supervision/assist for medications management;Direct supervision/assist for financial management;Assist for transportation;Help with stairs or ramp for entrance;A lot of help with walking and/or transfers;A lot of help with bathing/dressing/bathroom;Assistance with cooking/housework   Equipment Recommendations  Other (comment) (TBD pending progress)    Recommendations for Other Services Rehab consult    Precautions / Restrictions Precautions Precautions: Fall Precaution Comments: L groin wound vac, sacral wound Restrictions Weight Bearing Restrictions: No        Mobility Bed Mobility Overal bed mobility: Needs Assistance Bed Mobility: Rolling, Sidelying to Sit, Sit to Sidelying Rolling: Min assist Sidelying to sit: Mod assist, +2 for safety/equipment Supine to sit: Mod assist   Sit to sidelying: Mod assist, +2 for safety/equipment General bed mobility comments: cued for rolling to get EOB to minimize sacral pain. Min A for rolling for peri care, Mod A x 2 for safety w/ assist for LE and lifting trunk    Transfers Overall transfer level: Needs assistance Equipment used: Rolling walker (2 wheels) Transfers: Sit to/from Stand Sit to Stand: Mod assist, +2 physical assistance, +2 safety/equipment           General transfer comment: Mod A x 2 to stand from bedside using RW, cues for hand placement and assist to tuck bottom in to obtain full posture. Able to stand up to 3 trials for side stepping along bedside     Balance Overall balance assessment: Needs assistance Sitting-balance support: Feet supported Sitting balance-Leahy Scale: Fair     Standing balance support: Bilateral upper extremity supported, During functional activity, Reliant on assistive device for balance Standing balance-Leahy Scale: Poor                             ADL either performed or assessed with clinical judgement   ADL Overall ADL's : Needs assistance/impaired                     Lower Body Dressing: Maximal assistance;Sit to/from stand       Toileting- Water quality scientist and Hygiene: Total assistance;Bed level Toileting - Clothing Manipulation Details (indicate cue type and reason): bowel incontinence on entry; assisted bed level, changed soiled sacral pad       General  ADL Comments: Progressing EOB/standing attempts though continues to require extensive assist for LB ADLs    Extremity/Trunk Assessment Upper Extremity Assessment Upper Extremity Assessment: Generalized weakness   Lower Extremity Assessment Lower Extremity  Assessment: Defer to PT evaluation        Vision   Vision Assessment?: No apparent visual deficits   Perception     Praxis      Cognition Arousal/Alertness: Awake/alert Behavior During Therapy: WFL for tasks assessed/performed Overall Cognitive Status: Impaired/Different from baseline Area of Impairment: Memory, Following commands, Safety/judgement, Awareness, Attention, Orientation, Problem solving                 Orientation Level: Disoriented to, Time, Situation Current Attention Level: Selective Memory: Decreased short-term memory Following Commands: Follows one step commands consistently Safety/Judgement: Decreased awareness of deficits, Decreased awareness of safety Awareness: Emergent Problem Solving: Decreased initiation, Slow processing, Requires verbal cues, Difficulty sequencing General Comments: improving cognition, able to state being at hospital but reports month as July. Follows directions consistently, showing some humor and responding more quickly        Exercises      Shoulder Instructions       General Comments Daughter at bedside    Pertinent Vitals/ Pain       Pain Assessment Pain Assessment: Faces Faces Pain Scale: Hurts even more Pain Location: sacral wound Pain Descriptors / Indicators: Grimacing, Guarding Pain Intervention(s): Monitored during session, Limited activity within patient's tolerance, Repositioned, Premedicated before session  Home Living                                          Prior Functioning/Environment              Frequency  Min 2X/week        Progress Toward Goals  OT Goals(current goals can now be found in the care plan section)  Progress towards OT goals: Progressing toward goals  Acute Rehab OT Goals Patient Stated Goal: be able to walk, home soon OT Goal Formulation: With patient Time For Goal Achievement: 09/16/22 Potential to Achieve Goals: Good ADL Goals Pt Will  Perform Grooming: with min assist;sitting Pt Will Perform Upper Body Dressing: with min assist;sitting Pt Will Perform Lower Body Dressing: with mod assist;sit to/from stand Pt Will Transfer to Toilet: with mod assist;stand pivot transfer;bedside commode Additional ADL Goal #1: Pt will follow 3 step command to complete ADL task Additional ADL Goal #2: Pt will identify 3+ fall prevention strategies for use in the home setting.  Plan Discharge plan remains appropriate    Co-evaluation    PT/OT/SLP Co-Evaluation/Treatment: Yes Reason for Co-Treatment: Complexity of the patient's impairments (multi-system involvement);For patient/therapist safety;To address functional/ADL transfers   OT goals addressed during session: ADL's and self-care;Proper use of Adaptive equipment and DME      AM-PAC OT "6 Clicks" Daily Activity     Outcome Measure   Help from another person eating meals?: A Little Help from another person taking care of personal grooming?: A Little Help from another person toileting, which includes using toliet, bedpan, or urinal?: Total Help from another person bathing (including washing, rinsing, drying)?: A Lot Help from another person to put on and taking off regular upper body clothing?: A Lot Help from another person to put on and taking off regular lower body clothing?: Total 6 Click Score: 12    End of Session Equipment  Utilized During Treatment: Rolling walker (2 wheels)  OT Visit Diagnosis: Unsteadiness on feet (R26.81);Muscle weakness (generalized) (M62.81);Other symptoms and signs involving cognitive function;Dizziness and giddiness (R42)   Activity Tolerance Patient tolerated treatment well   Patient Left in bed;with call bell/phone within reach;with bed alarm set;with family/visitor present   Nurse Communication Mobility status        Time: [id number] OT Time Calculation (min): 31 min  Charges: OT Treatments $Therapeutic Activity: 8-22 mins  Malachy Chamber,  OTR/L Acute Rehab Services Office: [place] 09/04/2022, 10:44 AM

## 2022-09-04 NOTE — Progress Notes (Signed)
Physical Therapy Treatment Patient Details Name: Albert Harris MRN: [id number] DOB: 06/24/1955 Today's Date: 09/04/2022   History of Present Illness 67 year old male  ER with abdominal pain, nausea, diaphoresis and unable to feel his Lt foot. Hypotensive with lactic acidosis in ER.  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 CRRT 3/22-3/26.  3/22 repair of distal Lt external iliac artery. PMHx: PAD, ESRD HD, recently hospitalized 3/16-18 for acute on chronic left lower extremity ischemia.    PT Comments    Pt much more alert today, demonstrating quicker response time and sense of humor throughout session, per pt's daughter at bedside pt getting close to his baseline in this respect. Pt continuing to report significant buttocks pain, exacerbated by pt soiled in stool requiring pericare during session. Pt with increased tolerance for functional mobility this date, tolerating x3 stands from EOB at mod +2 assist level. Pt motivated to progress to baseline of functional independence, plan remains appropriate.     Recommendations for follow up therapy are one component of a multi-disciplinary discharge planning process, led by the attending physician.  Recommendations may be updated based on patient status, additional functional criteria and insurance authorization.  Follow Up Recommendations       Assistance Recommended at Discharge Frequent or constant Supervision/Assistance  Patient can return home with the following Assist for transportation;Two people to help with walking and/or transfers;Two people to help with bathing/dressing/bathroom;Assistance with cooking/housework;Help with stairs or ramp for entrance   Equipment Recommendations       Recommendations for Other Services       Precautions / Restrictions Precautions Precautions: Fall Precaution Comments: L groin wound vac, sacral wound, stool incontinence Restrictions Weight Bearing Restrictions: No      Mobility  Bed Mobility Overal bed mobility: Needs Assistance Bed Mobility: Rolling, Sidelying to Sit, Sit to Sidelying Rolling: Min assist Sidelying to sit: Mod assist, +2 for safety/equipment Supine to sit: Mod assist   Sit to sidelying: Mod assist, +2 for safety/equipment General bed mobility comments: cued for rolling to get EOB to minimize sacral pain. Min A for rolling for peri care, Mod A x 2 for safety w/ assist for LE and lifting/lowering trunk    Transfers Overall transfer level: Needs assistance Equipment used: Rolling walker (2 wheels) Transfers: Sit to/from Stand Sit to Stand: Mod assist, +2 physical assistance, +2 safety/equipment           General transfer comment: assist for power up, rise, steadying, widening BOS via LLE as pt with decreased sensation LLE. STS x3 from EOB, x1 stagger step towards [facility] before fatiguing    Ambulation/Gait                   Stairs             Wheelchair Mobility    Modified Rankin (Stroke Patients Only)       Balance Overall balance assessment: Needs assistance Sitting-balance support: Feet supported Sitting balance-Leahy Scale: Fair     Standing balance support: Bilateral upper extremity supported, During functional activity, Reliant on assistive device for balance Standing balance-Leahy Scale: Poor                              Cognition Arousal/Alertness: Awake/alert Behavior During Therapy: WFL for tasks assessed/performed Overall Cognitive Status: Impaired/Different from baseline Area of Impairment: Memory, Following commands, Safety/judgement, Awareness, Attention, Orientation, Problem solving  Orientation Level: Disoriented to, Time, Situation Current Attention Level: Selective Memory: Decreased short-term memory Following Commands: Follows one step commands consistently Safety/Judgement: Decreased awareness of deficits, Decreased awareness of  safety Awareness: Emergent Problem Solving: Decreased initiation, Requires verbal cues, Difficulty sequencing General Comments: improving cognition, able to state being at hospital but reports month as July. Follows directions consistently, showing some humor and responding more quickly today vs previous sessions        Exercises General Exercises - Lower Extremity Ankle Circles/Pumps: AROM, Right, 5 reps, Supine (no active DF LLE) Quad Sets: AAROM, Both, 5 reps, Supine Gluteal Sets: AAROM, Both, 5 reps, Supine    General Comments General comments (skin integrity, edema, etc.): vss      Pertinent Vitals/Pain Pain Assessment Pain Assessment: Faces Faces Pain Scale: Hurts even more Pain Location: sacral wound Pain Descriptors / Indicators: Grimacing, Guarding Pain Intervention(s): Limited activity within patient's tolerance, Monitored during session, Repositioned    Home Living                          Prior Function            PT Goals (current goals can now be found in the care plan section) Acute Rehab PT Goals Patient Stated Goal: Pt wants to get better and go home. PT Goal Formulation: With patient/family Time For Goal Achievement: 09/14/22 Potential to Achieve Goals: Good Progress towards PT goals: Progressing toward goals    Frequency    Min 3X/week      PT Plan Current plan remains appropriate    Co-evaluation PT/OT/SLP Co-Evaluation/Treatment: Yes Reason for Co-Treatment: Complexity of the patient's impairments (multi-system involvement);For patient/therapist safety;To address functional/ADL transfers PT goals addressed during session: Mobility/safety with mobility;Balance OT goals addressed during session: ADL's and self-care;Proper use of Adaptive equipment and DME      AM-PAC PT "6 Clicks" Mobility   Outcome Measure  Help needed turning from your back to your side while in a flat bed without using bedrails?: A Lot Help needed moving  from lying on your back to sitting on the side of a flat bed without using bedrails?: A Lot Help needed moving to and from a bed to a chair (including a wheelchair)?: Total Help needed standing up from a chair using your arms (e.g., wheelchair or bedside chair)?: Total Help needed to walk in hospital room?: Total Help needed climbing 3-5 steps with a railing? : Total 6 Click Score: 8    End of Session   Activity Tolerance: Patient limited by fatigue Patient left: in bed;with call bell/phone within reach;Other (comment);with family/visitor present Nurse Communication: Mobility status PT Visit Diagnosis: Other abnormalities of gait and mobility (R26.89);Muscle weakness (generalized) (M62.81);Difficulty in walking, not elsewhere classified (R26.2)     Time: 1000-1027 PT Time Calculation (min) (ACUTE ONLY): 27 min  Charges:  $Therapeutic Activity: 8-22 mins                     Stacie Glaze, PT DPT Acute Rehabilitation Services Pager [phone]  Office [phone]    Salem E Ruffin Pyo 09/04/2022, 11:14 AM

## 2022-09-04 NOTE — Progress Notes (Signed)
Attempted to pick up patient for TEE today, pt currently being worked up for MI.  Per Dr. Jillyn Hidden, TEE needs to be deferred until workup complete.  Bedside RN aware, I will move TEE to Monday.  Larene Beach RN

## 2022-09-04 NOTE — Progress Notes (Signed)
[place] for Heparin Indication:  lower extremity limb ischemia  Allergies  Allergen Reactions   Benadryl [Diphenhydramine] Rash   Lidocaine-Prilocaine Rash    Caused red marks up and down arm    Patient Measurements: Height: 5\' 8"  (172.7 cm) Weight: 56.6 kg (124 lb 12.5 oz) IBW/kg (Calculated) : 68.4 Heparin Dosing Weight: 64.4 kg  Vital Signs: Temp: 98.1 F (36.7 C) (03/29 0726) Temp Source: Oral (03/29 0726) BP: 107/82 (03/29 1000) Pulse Rate: 82 (03/29 1000)  Labs: Recent Labs    09/02/22 0424 09/02/22 1505 09/03/22 0624 09/03/22 0952 09/03/22 1858 09/04/22 0838  HGB 7.4* 9.2* 8.1*  --   --  9.1*  HCT 22.4* 27.0* 24.9*  --   --  28.9*  PLT 180  --  181  --   --  216  HEPARINUNFRC 0.23*  --   --  0.41 0.33 0.40  CREATININE 3.92*  --   --  2.59*  --  4.21*    Assessment: 67 yr old male with hx LLE thromboembolectomy s/p left groin pseudoaneurysm repair and hematoma evacuation for heparin.   Underwent R arm AV loop graft embolectomy 3/27 - discussed with Dr Virl Cagey and plan to restart heparin in 4 hours. Was previously on heparin infusion at 2100 units/hr prior to procedure. Of note, did receive 10,000 units during procedure.  Heparin level therapeutic at 0.40. Hgb 9.1, PLT 216 improved. No issues with heparin infusion or s/sx bleeding noted.Hematoma noted to be improved.  Goal of Therapy:  Heparin level 0.3-0.5 units/ml  Monitor platelets by anticoagulation protocol: Yes   Plan:  Continue heparin infusion at 2100 units/hr Check heparin level daily, CBC daily  Monitor for signs and symptoms of bleeding Closely monitor hematoma  Follow up long-term anticoagulation plan  Eliseo Gum, PharmD PGY1 Pharmacy Resident   09/04/2022  11:13 AM

## 2022-09-04 NOTE — Progress Notes (Signed)
Lumbar MRI is negative for impingement. 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 I consulted Neurology and spoke with Dr. Luevenia Maxin. Patient will be added to their rounding list.

## 2022-09-04 NOTE — Progress Notes (Signed)
Nephrology Follow-Up Consult note   Assessment/Recommendations: Albert Harris is a/an 67 y.o. male with a past medical history significant for ESRD, PAD, CHF, HTN, admitted for critical limb ischemia.       ESRD: TTS and [place] via RUE AVG. was initially on CRRT given severe hemodynamic compromise.  Stopped CRRT on the morning of 3/26.  Received dialysis morning of 09/03/2022.  Hopefully hold off on dialysis until Monday at that time can get tunneled dialysis catheter if infection has improved  Left common femoral artery pseudoaneurysm: Status post surgery with Dr. Unk Lightning on the morning of 08/29/2022 status post repair.  Overall much improved  Shock/MRSA Bacteremia: Likely multifactorial with infectious component. MRSA treatment per primary.  TEE today.   Anemia: Likely multifactorial.  No IV iron given bacteremia. ESA ordered.  Dialysis access: AVG clotted attempted declot on 3/27 but unsuccessful. Temp cath removed 3/28. Hopefully for [facility] on Monday pending clear cultures   Recommendations conveyed to primary service.    [facility] 09/04/2022 9:28 AM  ___________________________________________________________  CC: Femoral artery pseudoaneurysm  Interval History/Subjective: Patient feels well with no complaints.   Medications:  Current Facility-Administered Medications  Medication Dose Route Frequency Provider Last Rate Last Admin   (feeding supplement) PROSource Plus liquid 30 mL  30 mL Oral BID BM Baglia, Corrina, PA-C   30 mL at 09/03/22 1648   0.9 %  sodium chloride infusion (Manually program via Guardrails IV Fluids)   Intravenous Once Baglia, Corrina, PA-C       0.9 %  sodium chloride infusion   Intra-arterial PRN Baglia, Corrina, PA-C       0.9 %  sodium chloride infusion  250 mL Intravenous Continuous Baglia, Corrina, PA-C   Stopped at 09/01/22 0400   acetaminophen (TYLENOL) tablet 650 mg  650 mg Oral Q6H PRN Baglia, Corrina, PA-C        Or   acetaminophen (TYLENOL) suppository 650 mg  650 mg Rectal Q4H PRN Baglia, Corrina, PA-C       alteplase (CATHFLO ACTIVASE) injection 2 mg  2 mg Intracatheter Once PRN Baglia, Corrina, PA-C       Chlorhexidine Gluconate Cloth 2 % PADS 6 each  6 each Topical Q0600 Baglia, Corrina, PA-C   6 each at 09/03/22 1125   Darbepoetin Alfa (ARANESP) injection 100 mcg  100 mcg Subcutaneous Q Wed-1800 Baglia, Corrina, PA-C   100 mcg at 09/02/22 1731   docusate sodium (COLACE) capsule 100 mg  100 mg Oral BID PRN Baglia, Corrina, PA-C       feeding supplement (NEPRO CARB STEADY) liquid 237 mL  237 mL Oral BID BM Baglia, Corrina, PA-C   237 mL at 09/03/22 1648   heparin ADULT infusion 100 units/mL (25000 units/292mL)  2,100 Units/hr Intravenous Continuous Mannam, Praveen, MD 21 mL/hr at 09/04/22 0622 2,100 Units/hr at 09/04/22 0622   heparin injection 1,000 Units  1,000 Units Dialysis PRN Baglia, Corrina, PA-C       hydrALAZINE (APRESOLINE) injection 10 mg  10 mg Intravenous Q4H PRN Baglia, Corrina, PA-C   10 mg at 09/02/22 0421   HYDROmorphone (DILAUDID) injection 0.5 mg  0.5 mg Intravenous Q8H PRN Margaretha Seeds, MD   0.5 mg at 09/04/22 [id number]   multivitamin (RENA-VIT) tablet 1 tablet  1 tablet Oral QHS Baglia, Corrina, PA-C   1 tablet at 09/03/22 2244   ondansetron (ZOFRAN) injection 4 mg  4 mg Intravenous Q6H PRN Baglia, Corrina, PA-C  Oral care mouth rinse  15 mL Mouth Rinse PRN Baglia, Corrina, PA-C       pantoprazole (PROTONIX) EC tablet 40 mg  40 mg Oral Daily Baglia, Corrina, PA-C   40 mg at 09/04/22 [id number]   pentafluoroprop-tetrafluoroeth (GEBAUERS) aerosol 1 Application  1 Application Topical PRN Baglia, Corrina, PA-C       polyethylene glycol (MIRALAX / GLYCOLAX) packet 17 g  17 g Oral Daily PRN Baglia, Corrina, PA-C       sodium chloride flush (NS) 0.9 % injection 10-40 mL  10-40 mL Intracatheter Q12H Baglia, Corrina, PA-C   10 mL at 09/03/22 2244   sodium chloride flush (NS) 0.9 % injection  10-40 mL  10-40 mL Intracatheter PRN Baglia, Corrina, PA-C       vancomycin variable dose per unstable renal function (pharmacist dosing)   Does not apply See admin instructions Baglia, Corrina, PA-C          Review of Systems: 10 systems reviewed and negative except per HPI  Physical Exam: Vitals:   09/04/22 0600 09/04/22 0726  BP: (!) 143/70   Pulse: 72   Resp: 18   Temp:  98.1 F (36.7 C)  SpO2: 100%    No intake/output data recorded.  Intake/Output Summary (Last 24 hours) at 09/04/2022 [id number] Last data filed at 09/04/2022 0600 Gross per 24 hour  Intake 446.01 ml  Output --  Net 446.01 ml   Constitutional: lying in bed, nad ENMT: ears and nose without scars or lesions, MMM CV: normal rate, no edema Respiratory: bilateral chest rise, no iwob Gastrointestinal: soft, nonended Skin: no visible lesions or rashes Psych: mood and affect appropriate  Access: Right upper extremity AVG, no bruit or thrill   Test Results I personally reviewed new and old clinical labs and radiology tests Lab Results  Component Value Date   NA 133 (L) 09/03/2022   K 3.7 09/03/2022   CL 95 (L) 09/03/2022   CO2 25 09/03/2022   BUN 26 (H) 09/03/2022   CREATININE 2.59 (H) 09/03/2022   GFR 58.30 (L) 01/19/2017   CALCIUM 7.8 (L) 09/03/2022   ALBUMIN 1.9 (L) 09/03/2022   PHOS 2.7 09/03/2022    CBC Recent Labs  Lab 08/28/22 1810 08/28/22 2224 09/01/22 0340 09/01/22 0557 09/02/22 0424 09/02/22 1505 09/03/22 0624  WBC 19.2*   < > 29.5*  --  28.7*  --  22.3*  NEUTROABS 16.6*  --   --   --   --   --   --   HGB 8.7*   < > 7.5*   < > 7.4* 9.2* 8.1*  HCT 27.2*   < > 23.7*   < > 22.4* 27.0* 24.9*  MCV 107.5*   < > 97.9  --  96.6  --  92.6  PLT 329   < > 182  --  180  --  181   < > = values in this interval not displayed.

## 2022-09-04 NOTE — Progress Notes (Addendum)
  Progress Note    09/04/2022 7:58 AM 2 Days Post-Op  Subjective:  feeling okay. Pain in his feet feels better with his legs propped up on pillows    Vitals:   09/04/22 0600 09/04/22 0726  BP: (!) 143/70   Pulse: 72   Resp: 18   Temp:  98.1 F (36.7 C)  SpO2: 100%     Physical Exam: Cardiac:  regular Lungs:  nonlabored Incisions:  R arm incision c/d/l. L groin incision with prevena with good seal. No surrounding erythema or swelling Extremities:  BLE brisk AT/PT doppler signals   CBC    Component Value Date/Time   WBC 22.3 (H) 09/03/2022 0624   RBC 2.69 (L) 09/03/2022 0624   HGB 8.1 (L) 09/03/2022 0624   HGB 10.4 (L) 06/18/2021 1431   HCT 24.9 (L) 09/03/2022 0624   HCT 31.6 (L) 06/18/2021 1431   PLT 181 09/03/2022 0624   PLT 142 (L) 06/18/2021 1431   MCV 92.6 09/03/2022 0624   MCV 96 06/18/2021 1431   MCH 30.1 09/03/2022 0624   MCHC 32.5 09/03/2022 0624   RDW 19.2 (H) 09/03/2022 0624   RDW 14.3 06/18/2021 1431   LYMPHSABS 1.5 08/28/2022 1810   LYMPHSABS 1.5 06/18/2021 1431   MONOABS 0.7 08/28/2022 1810   EOSABS 0.1 08/28/2022 1810   EOSABS 1.0 (H) 06/18/2021 1431   BASOSABS 0.1 08/28/2022 1810   BASOSABS 0.1 06/18/2021 1431    BMET    Component Value Date/Time   NA 133 (L) 09/03/2022 0952   NA 141 06/18/2021 1431   K 3.7 09/03/2022 0952   CL 95 (L) 09/03/2022 0952   CO2 25 09/03/2022 0952   GLUCOSE 95 09/03/2022 0952   BUN 26 (H) 09/03/2022 0952   BUN 20 06/18/2021 1431   CREATININE 2.59 (H) 09/03/2022 0952   CREATININE 1.15 02/19/2017 1049   CALCIUM 7.8 (L) 09/03/2022 0952   CALCIUM 8.5 (L) 06/10/2020 0900   GFRNONAA 26 (L) 09/03/2022 0952   GFRAA 6 (L) 03/04/2020 1045    INR    Component Value Date/Time   INR 1.2 08/12/2022 1501     Intake/Output Summary (Last 24 hours) at 09/04/2022 0758 Last data filed at 09/04/2022 0600 Gross per 24 hour  Intake 608.11 ml  Output --  Net 608.11 ml      Assessment/Plan:  67 y.o. male is s/p:   7 days post op: L femoral exploration with pseudoaneurysm repair 2 days post op: RUE AVG thrombectomy  -L groin incision with prevena vac with good seal. Vac can be taken down tomorrow -Pain in both feet improved if legs are propped up on pillows to float the heels. Brisk doppler signals in BLE -RUE AVG is occluded. Patient underwent dialysis yesterday and temp cath was removed afterwards. Line holiday over the weekend. Hopeful that we can place a TDC on Monday -Continue ABX, no growth on blood cultures currently -Continue heparin gtt  Vicente Serene, PA-C [facility] [phone] 09/04/2022 7:58 AM  VASCULAR STAFF ADDENDUM: I have independently interviewed and examined the patient. I agree with the above.  VAC dressing off when it turns off this evening/tomorrow morning.  Signals in the feet. Likely some ischemic rest pain in the right. No intervention MRI with stroke Continue heparin unless d/cd by neurology.  Plan for [facility] Monday with me    Cassandria Santee, MD [facility] Phone Number: [phone] 09/04/2022 3:08 PM

## 2022-09-05 ENCOUNTER — Inpatient Hospital Stay (HOSPITAL_COMMUNITY): Payer: Medicare Other

## 2022-09-05 DIAGNOSIS — I724 Aneurysm of artery of lower extremity: Secondary | ICD-10-CM | POA: Diagnosis not present

## 2022-09-05 DIAGNOSIS — I998 Other disorder of circulatory system: Secondary | ICD-10-CM

## 2022-09-05 DIAGNOSIS — E872 Acidosis, unspecified: Secondary | ICD-10-CM | POA: Diagnosis not present

## 2022-09-05 DIAGNOSIS — R52 Pain, unspecified: Secondary | ICD-10-CM

## 2022-09-05 DIAGNOSIS — I739 Peripheral vascular disease, unspecified: Secondary | ICD-10-CM

## 2022-09-05 DIAGNOSIS — M79605 Pain in left leg: Secondary | ICD-10-CM | POA: Diagnosis not present

## 2022-09-05 DIAGNOSIS — I6523 Occlusion and stenosis of bilateral carotid arteries: Secondary | ICD-10-CM | POA: Diagnosis not present

## 2022-09-05 DIAGNOSIS — I63311 Cerebral infarction due to thrombosis of right middle cerebral artery: Secondary | ICD-10-CM

## 2022-09-05 LAB — CBC
HCT: 22.9 % — ABNORMAL LOW (ref 39.0–52.0)
HCT: 21 % — ABNORMAL LOW (ref 39.0–52.0)
HCT: 26.5 % — ABNORMAL LOW (ref 39.0–52.0)
HCT: 25.4 % — ABNORMAL LOW (ref 39.0–52.0)
Hemoglobin: 7.5 g/dL — ABNORMAL LOW (ref 13.0–17.0)
Hemoglobin: 7 g/dL — ABNORMAL LOW (ref 13.0–17.0)
Hemoglobin: 8.5 g/dL — ABNORMAL LOW (ref 13.0–17.0)
Hemoglobin: 8.6 g/dL — ABNORMAL LOW (ref 13.0–17.0)
MCH: 31.5 pg (ref 26.0–34.0)
MCH: 31.8 pg (ref 26.0–34.0)
MCH: 30.7 pg (ref 26.0–34.0)
MCH: 30.9 pg (ref 26.0–34.0)
MCHC: 32.8 g/dL (ref 30.0–36.0)
MCHC: 33.3 g/dL (ref 30.0–36.0)
MCHC: 32.1 g/dL (ref 30.0–36.0)
MCHC: 33.9 g/dL (ref 30.0–36.0)
MCV: 96.2 fL (ref 80.0–100.0)
MCV: 95.5 fL (ref 80.0–100.0)
MCV: 95.7 fL (ref 80.0–100.0)
MCV: 91.4 fL (ref 80.0–100.0)
Platelets: 199 10*3/uL (ref 150–400)
Platelets: 192 10*3/uL (ref 150–400)
Platelets: 192 10*3/uL (ref 150–400)
Platelets: 193 10*3/uL (ref 150–400)
RBC: 2.38 MIL/uL — ABNORMAL LOW (ref 4.22–5.81)
RBC: 2.2 MIL/uL — ABNORMAL LOW (ref 4.22–5.81)
RBC: 2.77 MIL/uL — ABNORMAL LOW (ref 4.22–5.81)
RBC: 2.78 MIL/uL — ABNORMAL LOW (ref 4.22–5.81)
RDW: 19.3 % — ABNORMAL HIGH (ref 11.5–15.5)
RDW: 19 % — ABNORMAL HIGH (ref 11.5–15.5)
RDW: 19.4 % — ABNORMAL HIGH (ref 11.5–15.5)
RDW: 19.7 % — ABNORMAL HIGH (ref 11.5–15.5)
WBC: 19.9 10*3/uL — ABNORMAL HIGH (ref 4.0–10.5)
WBC: 19.7 10*3/uL — ABNORMAL HIGH (ref 4.0–10.5)
WBC: 20 10*3/uL — ABNORMAL HIGH (ref 4.0–10.5)
WBC: 20.3 10*3/uL — ABNORMAL HIGH (ref 4.0–10.5)
nRBC: 0.1 % (ref 0.0–0.2)
nRBC: 0 % (ref 0.0–0.2)
nRBC: 0.1 % (ref 0.0–0.2)
nRBC: 0 % (ref 0.0–0.2)

## 2022-09-05 LAB — CULTURE, BLOOD (ROUTINE X 2)
Culture: NO GROWTH
Special Requests: ADEQUATE

## 2022-09-05 LAB — RENAL FUNCTION PANEL
Albumin: 1.9 g/dL — ABNORMAL LOW (ref 3.5–5.0)
Anion gap: 15 (ref 5–15)
BUN: 61 mg/dL — ABNORMAL HIGH (ref 8–23)
CO2: 21 mmol/L — ABNORMAL LOW (ref 22–32)
Calcium: 8.3 mg/dL — ABNORMAL LOW (ref 8.9–10.3)
Chloride: 95 mmol/L — ABNORMAL LOW (ref 98–111)
Creatinine, Ser: 5.41 mg/dL — ABNORMAL HIGH (ref 0.61–1.24)
GFR, Estimated: 11 mL/min — ABNORMAL LOW (ref >60)
Glucose, Bld: 100 mg/dL — ABNORMAL HIGH (ref 70–99)
Phosphorus: 4.7 mg/dL — ABNORMAL HIGH (ref 2.5–4.6)
Potassium: 3.8 mmol/L (ref 3.5–5.1)
Sodium: 131 mmol/L — ABNORMAL LOW (ref 135–145)

## 2022-09-05 LAB — LIPID PANEL
Cholesterol: 94 mg/dL (ref 0–200)
HDL: 29 mg/dL — ABNORMAL LOW (ref >40)
LDL Cholesterol: 45 mg/dL (ref 0–99)
Total CHOL/HDL Ratio: 3.2 RATIO
Triglycerides: 102 mg/dL (ref <150)
VLDL: 20 mg/dL (ref 0–40)

## 2022-09-05 LAB — HEPARIN LEVEL (UNFRACTIONATED): Heparin Unfractionated: 0.35 IU/mL (ref 0.30–0.70)

## 2022-09-05 LAB — VANCOMYCIN, RANDOM: Vancomycin Rm: 14 ug/mL

## 2022-09-05 LAB — PREPARE RBC (CROSSMATCH)

## 2022-09-05 MED ORDER — NOREPINEPHRINE 4 MG/250ML-% IV SOLN
INTRAVENOUS | Status: AC
  Filled 2022-09-05: qty 250

## 2022-09-05 MED ORDER — HYDROMORPHONE HCL 1 MG/ML IJ SOLN
0.5000 mg | INTRAMUSCULAR | Status: DC | PRN
  Administered 2022-09-05 – 2022-09-09 (×18): 0.5 mg via INTRAVENOUS
  Filled 2022-09-05 (×18): qty 0.5

## 2022-09-05 MED ORDER — VANCOMYCIN HCL 750 MG/150ML IV SOLN
750.0000 mg | Freq: Once | INTRAVENOUS | Status: AC
  Administered 2022-09-05: 750 mg via INTRAVENOUS
  Filled 2022-09-05: qty 150

## 2022-09-05 MED ORDER — SODIUM CHLORIDE 0.9% IV SOLUTION: Freq: Once | INTRAVENOUS | Status: AC

## 2022-09-05 MED ORDER — SODIUM CHLORIDE 0.9 % IV BOLUS
1000.0000 mL | Freq: Once | INTRAVENOUS | Status: AC
  Administered 2022-09-05: 1000 mL via INTRAVENOUS

## 2022-09-05 NOTE — Progress Notes (Signed)
VASCULAR LAB    Bilateral lower extremity arterial duplex has been performed.  See CV proc for preliminary results.   Breck Hollinger, RVT 09/05/2022, 6:18 PM

## 2022-09-05 NOTE — Progress Notes (Signed)
VASCULAR LAB    Carotid duplex has been performed.  See CV proc for preliminary results.   Hayly Litsey, RVT 09/05/2022, 6:18 PM

## 2022-09-05 NOTE — Progress Notes (Signed)
Pharmacy Antibiotic Note  Albert Harris is a 67 y.o. 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 Now with MRSA bacteremia. Noted patient is ESRD HD TTS PTA, with a right arm AV graft and a catheter in his right IJ. Pharmacy has been consulted for vancomycin dosing.   Random Vancomycin level 3/30 is 14 which is subtherapeutic, will redose  Plan: Vancomycin 750 mg IV x 1 Evaluate levels as appropriate  Monitor HD schedule, blood cultures, TEE, clinical progression  Height: 5\' 8"  (172.7 cm) Weight: 57.3 kg (126 lb 5.2 oz) IBW/kg (Calculated) : 68.4  Temp (24hrs), Avg:98.2 F (36.8 C), Min:97.5 F (36.4 C), Max:99.1 F (37.3 C)  Recent Labs  Lab 09/01/22 1730 09/02/22 0424 09/02/22 1659 09/03/22 0624 09/03/22 0952 09/04/22 0838 09/05/22 0053 09/05/22 1405  WBC  --  28.7*  --  22.3*  --  19.4* 19.9* 19.7*  CREATININE 3.19* 3.92*  --   --  2.59* 4.21* 5.41*  --   LATICACIDVEN  --   --  1.0  --   --   --   --   --   VANCORANDOM  --  30  --   --   --   --   --  14     Estimated Creatinine Clearance: 10.6 mL/min (A) (by C-G formula based on SCr of 5.41 mg/dL (H)).    Allergies  Allergen Reactions   Benadryl [Diphenhydramine] Rash   Lidocaine-Prilocaine Rash    Caused red marks up and down arm   Antimicrobials this admission:  Cefazolin 3/23 x 1 Vancomycin 3/24 >>   Microbiology results:  3/27 Bcx: ngtd 3/25 Bcx: GPCs 1/4 bottles  3/23 Bcx: 3/4 bottles MRSA   Alanda Slim, PharmD, [facility] Clinical Pharmacist Please see AMION for all Pharmacists' Contact Phone Numbers 09/05/2022, 3:07 PM

## 2022-09-05 NOTE — Progress Notes (Addendum)
NAME:  CREDENCE GIANNOTTI, MRN:  [id number], DOB:  [birth year], LOS: 39 ADMISSION DATE:  08/28/2022, CONSULTATION DATE:  08/28/22 REFERRING MD: Jeannine Kitten , CHIEF COMPLAINT:  Abdominal pain  History of Present Illness:   67 yo male brought to ER with abdominal pain, nausea, diaphoresis and unable to feel his Lt foot.  Hypotensive with lactic acidosis in ER.  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  Hx of ESRD and had hyperkalemia.  Had emergent CRRT.  Vascular consulted and taken to OR.    Pertinent  Medical History  ESRD -AV fistula (no flow tonight), PAD, COPD, HLD, HTN, HFrEF 35-40%, PAD  Significant Hospital Events: Including procedures, antibiotic start and stop dates in addition to other pertinent events   3/22 Admit, emergent CRRT, to OR for repair of distal Lt external iliac artery 3/24 Extubated. CT head obtained for altered mental status did not show any acute abnormalities 3/26 Off CRRT and pressors, Vascular surgery consulted for clotted AVG 3/27 AV graft thrombectomy attempted in OR but patient became hemodynamically unstable and procedure was aborted.  Was on pressors briefly 3/28 dialysis followed by CVC and aline removal for line holiday 3/29 MRI brain ordered for LLE weakness and found with right subinsular 87mm infarct. Neuro consulted  Interim History / Subjective:   TEE not completed due to concern for chest pain. Trop peaked 146 with no significant EKG changes  Objective   Blood pressure 139/64, pulse 71, temperature 98.2 F (36.8 C), temperature source Oral, resp. rate (!) 25, height 5\' 8"  (1.727 m), weight 57.3 kg, SpO2 99 %.        Intake/Output Summary (Last 24 hours) at 09/05/2022 0827 Last data filed at 09/05/2022 0600 Gross per 24 hour  Intake 676.6 ml  Output --  Net 676.6 ml   Filed Weights   09/02/22 0342 09/04/22 0500 09/05/22 0500  Weight: 53.2 kg 56.6 kg 57.3 kg   Physical Exam: General: Chronically ill-appearing, no acute  distress HENT: [place], AT, OP clear, MMM Eyes: EOMI, no scleral icterus Respiratory: Clear to auscultation bilaterally.  No crackles, wheezing or rales Cardiovascular: RRR, -M/R/G, no JVD GI: BS+, soft, nontender, right groin wound vac Extremities:-Edema,-tenderness Neuro: AAO x2, CNII-XII grossly intact, LLE weakness - able to bend knee and wiggle toes. Sensation intact Psych: Normal mood, normal affect  BMET reviewed. K 3.8 CO2 21 BUN 61 WBC stable 19.9   Resolved Hospital Problem list   Anion gap metabolic acidosis with lactic acidosis, Hyperkalemia  Assessment & Plan:  MRSA bacteremia 2/2 post-op wound infection --Continue Vanc Following levels --Will need suppressive regimen after acute treatment --Appreciate ID involvement. Recommend line holiday x 72 hours starting 3/28 --MRI L neg --Bcx 3/27 NTD --TEE postponed yesterday  Lt common femoral artery pseudoaneurysm s/p Lt groin hematoma evacuation, primary repair of distal external iliac artery. --Hold heparin drip due to 2pt Hg drop. Will restart if repeat Hg stable --Postop care per vascular surgery  ESRD. --Line holiday 3/28  --Plan for tunnelled cath 4/1. No indication for HD today --If he decompensates over the weekend can return to ICU temporary HD catheter. Discussed with Nephrology and ok to transfer to floor  Acute respiratory failure 2nd to acidosis and uremia. - resolved Hx of COPD. --On room air  Anemia of critical illness and chronic disease. --Holding heparin for 2Pt Hg drop. Trend CBC --Trend CBC. Transfuse for hemoglobin less than 7  Chronic systolic CHF with EF 35 to 40% on Echo  from 06/29/22. Hx of pulmonary hypertension. --Hold outpt toprol, asa, plavix  Acute metabolic encephalopathy from uremia, acidosis. Acute 53mm right subinsular stroke  Monitor CT head on 3/24 was normal. --Further imaging and work-up per Neuro. Appreciate consult team's assistance  Best Practice (right click and "Reselect  all SmartList Selections" daily)   Diet/type: Regular consistency (see orders) DVT prophylaxis: systemic heparin  GI prophylaxis: PPI. Lines: N/A  Foley:  N/A Code Status:  full code Last date of multidisciplinary goals of care discussion [x]   Transfer to [facility] and floor 3/31  Critical care time:    Care Time: 50 min  Rodman Pickle, M.D. [facility] Pulmonary/Critical Care Medicine 09/05/2022 8:27 AM   Please see Amion for pager number to reach on-call Pulmonary and Critical Care Team.

## 2022-09-05 NOTE — Progress Notes (Signed)
ID PROGRESS NOTE  Blood cx still remain negative since 3/27 Currently has line holiday Brain MRI from 3/29 suggest CNS emboli concerning for possible endocarditis as source  Vanco random level due today to help with determining is needs additional dose of vancomycin given off schedule from HD.  Continue to treat with vancomycin for MRSA bacteremia with concern for endocarditis.  [facility] for Infectious Diseases [phone]

## 2022-09-05 NOTE — Progress Notes (Addendum)
STROKE TEAM PROGRESS NOTE   SUBJECTIVE (INTERVAL HISTORY) His daughter is at the bedside. Pt stated that during the MRI earlier today, he had pain at lower back and at left leg especially at left calf and left foot. C- spine MRI done but not T-spine. Left LE weak pedal doppler signals and per RN no dorsalis pedis signal at left foot. Pt also has dropping Hb, CT abdomen is ordered.   Plan for dialysis catheter on Monday. At this time AVF still closed and no dialysis planned over the weekend.    OBJECTIVE Temp:  [97.5 F (36.4 C)-99.1 F (37.3 C)] 97.7 F (36.5 C) (03/30 1113) Pulse Rate:  [69-89] 70 (03/30 1200) Cardiac Rhythm: Normal sinus rhythm (03/30 0800) Resp:  [13-34] 22 (03/30 1200) BP: (95-175)/(41-107) 113/51 (03/30 1200) SpO2:  [63 %-100 %] 96 % (03/30 1200) Weight:  [57.3 kg] 57.3 kg (03/30 0500)  Recent Labs  Lab 08/31/22 1608 08/31/22 1942 08/31/22 2339 09/01/22 0353 09/01/22 0750  GLUCAP 100* 104* 103* 92 81   Recent Labs  Lab 08/30/22 0411 08/30/22 1559 08/31/22 0330 08/31/22 1600 09/01/22 0340 09/01/22 0557 09/01/22 1730 09/02/22 0424 09/02/22 1505 09/03/22 0952 09/04/22 0838 09/05/22 0053  NA 136   < > 134*   < > 135   < > 134* 132* 132* 133* 134* 131*  K 4.8   < > 4.9   < > 4.5   < > 3.9 4.2 5.0 3.7 3.7 3.8  CL 98   < > 101   < > 97*  --  100 98  --  95* 94* 95*  CO2 23   < > 24   < > 23  --  22 21*  --  25 24 21*  GLUCOSE 148*   < > 104*   < > 96  --  127* 102*  --  95 90 100*  BUN 49*   < > 31*   < > 25*  --  43* 59*  --  26* 48* 61*  CREATININE 5.05*   < > 3.06*   < > 2.21*  --  3.19* 3.92*  --  2.59* 4.21* 5.41*  CALCIUM 8.3*   < > 7.8*   < > 8.4*  --  8.3* 8.2*  --  7.8* 8.7* 8.3*  MG 2.5*  --  2.5*  --  2.7*  --   --  2.7*  --   --  2.3  --   PHOS 3.7   < > 3.4   < > 3.0  --  3.4 3.2  --  2.7 4.2 4.7*   < > = values in this interval not displayed.   Recent Labs  Lab 09/01/22 1730 09/02/22 0424 09/03/22 0952 09/04/22 0838  09/05/22 0053  ALBUMIN 1.9* 1.9* 1.9* 2.2* 1.9*   Recent Labs  Lab 09/01/22 0340 09/01/22 0557 09/02/22 0424 09/02/22 1505 09/03/22 0624 09/04/22 0838 09/05/22 0053  WBC 29.5*  --  28.7*  --  22.3* 19.4* 19.9*  HGB 7.5*   < > 7.4* 9.2* 8.1* 9.1* 7.5*  HCT 23.7*   < > 22.4* 27.0* 24.9* 28.9* 22.9*  MCV 97.9  --  96.6  --  92.6 96.3 96.2  PLT 182  --  180  --  181 216 199   < > = values in this interval not displayed.   No results for input(s): "CKTOTAL", "CKMB", "CKMBINDEX", "TROPONINI" in the last 168 hours. No results for input(s): "LABPROT", "INR" in the  last 72 hours. No results for input(s): "COLORURINE", "LABSPEC", "PHURINE", "GLUCOSEU", "HGBUR", "BILIRUBINUR", "KETONESUR", "PROTEINUR", "UROBILINOGEN", "NITRITE", "LEUKOCYTESUR" in the last 72 hours.  Invalid input(s): "APPERANCEUR"     Component Value Date/Time   CHOL 133 08/23/2022 0207   CHOL 154 11/27/2020 1011   TRIG 166 (H) 08/30/2022 0411   HDL 36 (L) 08/23/2022 0207   HDL 53 11/27/2020 1011   CHOLHDL 3.7 08/23/2022 0207   VLDL 18 08/23/2022 0207   LDLCALC 79 08/23/2022 0207   LDLCALC 89 11/27/2020 1011   Lab Results  Component Value Date   HGBA1C 5.0 08/29/2021      Component Value Date/Time   LABOPIA NONE DETECTED 07/18/2019 0603   COCAINSCRNUR NONE DETECTED 07/18/2019 0603   LABBENZ NONE DETECTED 07/18/2019 0603   AMPHETMU NONE DETECTED 07/18/2019 0603   THCU NONE DETECTED 07/18/2019 0603   LABBARB NONE DETECTED 07/18/2019 0603    No results for input(s): "ETH" in the last 168 hours.  I have personally reviewed the radiological images below and agree with the radiology interpretations.  MR CERVICAL SPINE WO CONTRAST  Result Date: 09/05/2022 CLINICAL DATA:  Myelopathy, acute, cervical spine. EXAM: MRI CERVICAL SPINE WITHOUT CONTRAST TECHNIQUE: Multiplanar, multisequence MR imaging of the cervical spine was performed. No intravenous contrast was administered. COMPARISON:  Radiographs 05/11/2022, CT  08/07/2021 and MRI of the cervical spine 06/25/2022. FINDINGS: Despite efforts by the technologist and patient, mild to moderate motion artifact is present on today's exam and could not be eliminated. This reduces exam sensitivity and specificity. Alignment: Straightening without focal angulation or listhesis. Vertebrae: Interval improvement in the previously demonstrated abnormalities of the C5 and C6 vertebral bodies and intervening C5-6 disc. There is less marrow edema and less surrounding inflammatory change. No new disc space findings or acute osseous abnormalities. Cord: Stable cord flattening and possible T2 hyperintensity in the cord at the C3-4 and C5-6 levels. No evidence of cord hemorrhage. Posterior Fossa, vertebral arteries, paraspinal tissues: Intracranial findings are dictated separatelybilateral vertebral artery flow voids. The prevertebral inflammatory changes seen previously have significantly improved in the interval. No paraspinal fluid collection identified. Oval T2 hyperintensity within the left mandible was not previously imaged and could reflect osteomyelitis. Disc levels: C2-3: No significant findings. C3-4: Stable spondylosis with loss of disc height and posterior osteophytes covering diffusely bulging disc material. There is uncinate spurring and facet hypertrophy bilaterally contributing to stable mild cord flattening and foraminal narrowing bilaterally. There is possible mild cord T2 hyperintensity, grossly stable. C4-5: Mild spondylosis with mild disc bulging and facet hypertrophy. No cord deformity or high-grade foraminal narrowing. C5-6: Chronic spondylosis with loss of disc height and posterior osteophytes covering diffusely bulging disc material. The AP diameter of the canal is narrowed to approximately 5 mm with unchanged chronic cord compression. Moderate foraminal narrowing bilaterally. As above, the previously demonstrated paraspinal inflammatory changes and marrow edema at this  level have improved. C6-7: Stable spondylosis with posterior osteophytes covering diffusely bulging disc material. Mild spinal stenosis and mild foraminal narrowing bilaterally. C7-T1: Stable mild disc bulging and mild uncinate spurring bilaterally. No cord deformity. Mild foraminal narrowing bilaterally. T1-2: Normal interspace. IMPRESSION: 1. Compared with previous MRI from 06/25/2022, there has been significant improvement in the previously demonstrated paraspinal inflammatory changes and marrow edema at the C5-6 level, suggesting improving discitis/osteomyelitis. Correlate clinically. No residual paraspinal fluid collection identified. 2. Similar multilevel cervical spondylosis, most advanced at C5-6 where there is chronic cord compression and moderate foraminal narrowing bilaterally. Possible mild cord T2 hyperintensity at  C3-4 and C5-6. 3. T2 hyperintensity in the left body of the mandible, not previously imaged and potentially reflecting osteomyelitis. Correlate clinically. 4. No acute findings identified. Electronically Signed   By: Richardean Sale M.D.   On: 09/05/2022 12:37   MR ANGIO HEAD WO CONTRAST  Result Date: 09/05/2022 CLINICAL DATA:  Acute cervical myelopathy EXAM: MRA HEAD WITHOUT CONTRAST TECHNIQUE: Angiographic images of the Circle of Willis were acquired using MRA technique without intravenous contrast. COMPARISON:  Brain MRI from yesterday FINDINGS: Pervasive motion artifact. The left vertebral artery is strongly dominant with thready flow in the right vertebral artery accentuated by motion. No branch occlusion, beading, or aneurysm. No flow limiting stenosis. IMPRESSION: Motion degraded MRA without acute finding. Irregular appearance of the right vertebral artery partially related to motion. Electronically Signed   By: Jorje Guild M.D.   On: 09/05/2022 11:54   MR LUMBAR SPINE WO CONTRAST  Result Date: 09/04/2022 CLINICAL DATA:  LLE EXAM: MRI LUMBAR SPINE WITHOUT CONTRAST  TECHNIQUE: Multiplanar, multisequence MR imaging of the lumbar spine was performed. No intravenous contrast was administered. COMPARISON:  None Available. FINDINGS: Limited due to motion/artifact. Segmentation: Standard segmentation is assumed. The inferior-most fully formed intervertebral disc is labeled L5-S1. Alignment:  No substantial sagittal subluxation. Vertebrae: Heterogeneous bone marrow. No focal edema to suggest acute fracture or discitis/osteomyelitis. No focal suspicious bone lesion. Conus medullaris and cauda equina: Conus extends to the superior L2 level. Conus appears normal. Paraspinal and other soft tissues: Limited visualization without obvious acute abnormality. Disc levels: T12-L1: No significant disc protrusion, foraminal stenosis, or canal stenosis. L1-L2: No significant disc protrusion, foraminal stenosis, or canal stenosis. L2-L3: No significant disc protrusion, foraminal stenosis, or canal stenosis. L3-L4: Mild disc bulging and facet arthropathy. No significant stenosis. L4-L5: No significant disc protrusion, foraminal stenosis, or canal stenosis. L5-S1: Facet arthropathy. Small left paracentral disc protrusion which comes in close proximity to the exiting left L5 nerve without impingement. No significant stenosis. IMPRESSION: 1. Motion limited study without evidence of significant canal or foraminal stenosis. 2. At L5-S1, small left paracentral disc protrusion which comes in close proximity to the exiting left L5 nerve without impingement. Electronically Signed   By: Margaretha Sheffield M.D.   On: 09/04/2022 12:19   MR BRAIN WO CONTRAST  Result Date: 09/04/2022 CLINICAL DATA:  Provided history: Altered mental status, left lower extremity weakness. EXAM: MRI HEAD WITHOUT CONTRAST TECHNIQUE: Multiplanar, multiecho pulse sequences of the brain and surrounding structures were obtained without intravenous contrast. COMPARISON:  Head CT 08/30/2022. FINDINGS: Intermittently motion degraded  examination, limiting evaluation. Most notably, the sagittal T1 sequence is moderately motion degraded and the least motion degraded axial T2 sequence is moderately motion degraded. Within this limitation, findings are as follows. Brain: Moderate generalized cerebral atrophy. Mild cerebellar atrophy. 4 mm acute infarct within the right subinsular white matter (series 2, images 22 and 23) (series 3, image 19). Redemonstrated chronic lacunar infarct within the right basal ganglia. Background multifocal T2 FLAIR hyperintense signal abnormality within the cerebral white matter, nonspecific but compatible with mild chronic small vessel ischemic disease. These findings are similar to the prior brain MRI of 05/09/2021. As before, there are a few chronic microhemorrhages scattered within the supratentorial brain. Redemonstrated tiny chronic lacunar infarct within the central pons, and small chronic infarcts within the left cerebellar hemisphere. No evidence of an intracranial mass. No extra-axial fluid collection. No midline shift. Vascular: As before, there is signal abnormality within the intracranial right vertebral artery suggesting high-grade stenosis or  vessel occlusion. Skull and upper cervical spine: No focal suspicious marrow lesion. Incompletely assessed cervical spondylosis. Sinuses/Orbits: No mass or acute finding within the imaged orbits. No more than trace paranasal sinus mucosal thickening. IMpression #2 will be called to the ordering clinician or representative by the Radiologist Assistant, and communication documented in the PACS or [employer]. IMPRESSION: 1. Motion degraded examination. 2. 4 mm acute infarct within the right subinsular white matter. 3. Parenchymal atrophy, chronic small vessel ischemic disease and chronic infarcts as described. 4. Few chronic microhemorrhages within the supratentorial brain. 5. Chronic signal abnormality within the intracranial right vertebral artery suggesting  high-grade stenosis or vessel occlusion. Electronically Signed   By: Kellie Simmering D.O.   On: 09/04/2022 12:01   VAS [place] LOWER EXTREMITY SAPHENOUS VEIN MAPPING  Result Date: 08/31/2022 LOWER EXTREMITY VEIN MAPPING Patient Name:  FERGUS DETTLING  Date of Exam:   08/31/2022 Medical Rec #: [id number]       Accession #:    [id number] Date of Birth: 10-26-1955      Patient Gender: M Patient Age:   23 years Exam Location:  [facility] Procedure:      VAS [place] LOWER EXTREMITY SAPHENOUS VEIN MAPPING Referring Phys: Vonna Kotyk ROBINS --------------------------------------------------------------------------------  Indications:  Pre-op Risk Factors: PAD.  Limitations: Right Fem-pop bypass graft with saphenous vein 09/05/21 Comparison Study: Prior vein mapping done 09/18/20 Performing Technologist: Sharion Dove RVS  Examination Guidelines: A complete evaluation includes B-mode imaging, spectral Doppler, color Doppler, and power Doppler as needed of all accessible portions of each vessel. Bilateral testing is considered an integral part of a complete examination. Limited examinations for reoccurring indications may be performed as noted. +----------+--------------+-----------------------+----------------+-----------+     RT     RT Findings            GSV          LT Diameter (cm)LT Findings  Diameter                                                                     (cm)                                                                    +----------+--------------+-----------------------+----------------+-----------+             Harvested   Saphenofemoral Junction      0.67                  +----------+--------------+-----------------------+----------------+-----------+             Harvested       Proximal thigh           0.48                  +----------+--------------+-----------------------+----------------+-----------+             Harvested          Mid thigh             0.40                   +----------+--------------+-----------------------+----------------+-----------+  Harvested        Distal thigh         0.44/0.31     branching  +----------+--------------+-----------------------+----------------+-----------+           not visualized         Knee                0.22                  +----------+--------------+-----------------------+----------------+-----------+           not visualized       Prox calf             0.21                  +----------+--------------+-----------------------+----------------+-----------+           not visualized       Mid calf            0.24/1.8     branching  +----------+--------------+-----------------------+----------------+-----------+           not visualized      Distal calf         0.360/.09     branching  +----------+--------------+-----------------------+----------------+-----------+           not visualized         Ankle            0.15/0.12     branching  +----------+--------------+-----------------------+----------------+-----------+ Diagnosing physician: [name] MD Electronically signed by [name] MD on 08/31/2022 at 6:30:54 PM.    Final    VAS [place] (AVF, AVG)  Result Date: 08/31/2022 DIALYSIS ACCESS Patient Name:  SEVE SCHLANGEN  Date of Exam:   08/31/2022 Medical Rec #: [id number]       Accession #:    [id number] Date of Birth: 06-24-1955      Patient Gender: M Patient Age:   39 years Exam Location:  [facility] Procedure:      VAS US DUPLEX DIALYSIS ACCESS (AVF, AVG) Referring Phys: Santiago Bumpers --------------------------------------------------------------------------------  Reason for Exam: No palpable thrill for AVF/AVG. Access Site: Right Upper Extremity. Access Type: Forearm loop AVG. Comparison Study: No prior study Performing Technologist: Sharion Dove RVS  Examination Guidelines: A complete evaluation includes B-mode imaging, spectral  Doppler, color Doppler, and power Doppler as needed of all accessible portions of each vessel. Unilateral testing is considered an integral part of a complete examination. Limited examinations for reoccurring indications may be performed as noted.  Findings:   Thrombosed AV graft  Summary: Thrombosed right AV graft  *See table(s) above for measurements and observations.  Diagnosing physician: [name] MD Electronically signed by [name] MD on 08/31/2022 at 6:30:35 PM.    --------------------------------------------------------------------------------   Final    ECHOCARDIOGRAM LIMITED  Result Date: 08/31/2022    ECHOCARDIOGRAM LIMITED REPORT   Patient Name:   DREDON BARRETT Date of Exam: 08/31/2022 Medical Rec #:  [id number]      Height:       68.0 in Accession #:    [id number]     Weight:       144.4 lb Date of Birth:  11-20-1955     BSA:          1.780 m Patient Age:    50 years       BP:           165/52 mmHg Patient Gender: M  HR:           79 bpm. Exam Location:  Inpatient Procedure: Limited Echo, Cardiac Doppler and Limited Color Doppler Indications:    Bacteremia R78.81  History:        Patient has prior history of Echocardiogram examinations, most                 recent 06/29/2022. 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  Sonographer:    Greer Pickerel Referring Phys: Toney Sang SOOD  Sonographer Comments: Image acquisition challenging due to respiratory motion. IMPRESSIONS  1. Left ventricular ejection fraction, by estimation, is 45%. There is moderate to severe concentric left ventricular hypertrophy. Diastolic function was not assessed.  2. Right ventricular systolic function is normal.  3. The mitral valve is grossly normal with mild thickening of the mitral valve leaflets. Mild mitral valve regurgitation.  4. Right atrial  size was mildly dilated.  5. Left atrial size was mildly dilated.  6. The inferior vena cava is normal in size with greater than 50% respiratory variability, suggesting right atrial pressure of 3 mmHg.  7. The aortic valve was not well visualized. There is mild calcification of the aortic valve. There is mild thickening of the aortic valve. Aortic valve regurgitation is not visualized. FINDINGS  Left Ventricle: Left ventricular ejection fraction, by estimation, is 45 to 50%. The left ventricle has mildly decreased function. There is severe concentric left ventricular hypertrophy. Left ventricular diastolic function could not be evaluated. Right Ventricle: No increase in right ventricular wall thickness. Right ventricular systolic function is normal. Left Atrium: Left atrial size was mildly dilated. Right Atrium: Right atrial size was mildly dilated. Pericardium: There is no evidence of pericardial effusion. Mitral Valve: The mitral valve is grossly normal. There is mild thickening of the mitral valve leaflet(s). Mild mitral annular calcification. Mild mitral valve regurgitation. Tricuspid Valve: The tricuspid valve is normal in structure. Tricuspid valve regurgitation is mild. Aortic Valve: The aortic valve was not well visualized. There is mild calcification of the aortic valve. There is mild thickening of the aortic valve. Aortic valve regurgitation is not visualized. Pulmonic Valve: The pulmonic valve was not assessed. Venous: The inferior vena cava is normal in size with greater than 50% respiratory variability, suggesting right atrial pressure of 3 mmHg. Additional Comments: Spectral Doppler performed. Color Doppler performed.  LEFT VENTRICLE PLAX 2D LVIDd:         4.00 cm LVIDs:         3.00 cm LV PW:         1.60 cm LV IVS:        1.10 cm  LEFT ATRIUM         Index LA diam:    2.80 cm 1.57 cm/m  MITRAL VALVE MV Area (PHT): 2.73 cm MV Decel Time: 278 msec MV E velocity: 72.80 cm/s MV A velocity: 99.00 cm/s  MV E/A ratio:  0.74 Aditya Sabharwal Electronically signed by Hebert Soho Signature Date/Time: 08/31/2022/2:22:32 PM    Final    CT HEAD WO CONTRAST (5MM)  Result Date: 08/30/2022 CLINICAL DATA:  Initial evaluation for mental status change. EXAM: CT HEAD WITHOUT CONTRAST TECHNIQUE: Contiguous axial images were obtained from the base of  the skull through the vertex without intravenous contrast. RADIATION DOSE REDUCTION: This exam was performed according to the departmental dose-optimization program which includes automated exposure control, adjustment of the mA and/or kV according to patient size and/or use of iterative reconstruction technique. COMPARISON:  Prior CT from 08/07/2021. FINDINGS: Brain: Age-related cerebral atrophy with mild chronic small vessel ischemic disease. Chronic right basal ganglia lacunar infarct. No acute intracranial hemorrhage. No acute large vessel territory infarct. No mass lesion or midline shift. No hydrocephalus or extra-axial fluid collection. Vascular: No abnormal hyperdense vessel. Scattered calcified atherosclerosis about the skull base, stable. Skull: Scalp soft tissues and calvarium demonstrate no acute finding. Sinuses/Orbits: Globes and orbital soft tissues within normal limits. Paranasal sinuses are clear. Trace right mastoid effusion noted, of doubtful significance. Other: None. IMPRESSION: 1. No acute intracranial abnormality. 2. Age-related cerebral atrophy with mild chronic small vessel ischemic disease, with small remote right basal ganglia lacunar infarct, stable. Electronically Signed   By: Jeannine Boga M.D.   On: 08/30/2022 19:21   DG Abd Portable 1V  Result Date: 08/29/2022 CLINICAL DATA:  Orogastric tube placement EXAM: PORTABLE ABDOMEN - 1 VIEW COMPARISON:  Portable exam 1228 hours compared to 1040 hours FINDINGS: Tip of nasogastric tube projects over distal gastric antrum/pylorus. Lung bases clear. Nonobstructive bowel gas pattern. IMPRESSION:  Tip of nasogastric tube projects over distal gastric antrum/pylorus. Electronically Signed   By: Lavonia Dana M.D.   On: 08/29/2022 12:45   DG Abd Portable 1V  Result Date: 08/29/2022 CLINICAL DATA:  Orogastric tube placement EXAM: PORTABLE ABDOMEN - 1 VIEW COMPARISON:  08/12/2022 CT abdomen FINDINGS: The orogastric tube distal tip is in the stomach body with side port in the gastric fundus. Borderline enlargement of the cardiopericardial silhouette. Formed stool in the visualized colon. Mild lumbar spondylosis. Enlargement of the cardiopericardial silhouette IMPRESSION: 1. Orogastric tube tip is in the stomach body with side port in the gastric fundus. 2. Borderline enlargement of the cardiopericardial silhouette. Electronically Signed   By: Van Clines M.D.   On: 08/29/2022 10:52   DG Chest Portable 1 View  Result Date: 08/28/2022 CLINICAL DATA:  Central line placement EXAM: PORTABLE CHEST 1 VIEW COMPARISON:  08/12/2022 FINDINGS: Right IJ line with tip at the upper SVC. Artifact from EKG leads. Nipple shadows asymmetric to the left, confirmed on recent abdominal CT covering the lung bases. There is no edema, consolidation, effusion, or pneumothorax. Normal heart size. IMPRESSION: Right IJ line without complicating feature. Electronically Signed   By: Jorje Guild M.D.   On: 08/28/2022 21:41   CT Angio Aortobifemoral W and/or Wo Contrast  Result Date: 08/28/2022 CLINICAL DATA:  Claudication or leg ischemia EXAM: CT ANGIOGRAPHY OF ABDOMINAL AORTA WITH ILIOFEMORAL RUNOFF TECHNIQUE: Multidetector CT imaging of the abdomen, pelvis and lower extremities was performed using the standard protocol during bolus administration of intravenous contrast. Multiplanar CT image reconstructions and MIPs were obtained to evaluate the vascular anatomy. RADIATION DOSE REDUCTION: This exam was performed according to the departmental dose-optimization program which includes automated exposure control, adjustment of  the mA and/or kV according to patient size and/or use of iterative reconstruction technique. CONTRAST:  41mL OMNIPAQUE IOHEXOL 350 MG/ML SOLN COMPARISON:  CT 08/12/2022 and previous FINDINGS: VASCULAR Aorta: Extensive partially calcified atheromatous plaque throughout. No aneurysm, dissection, or stenosis. 4 mm penetrating atheromatous ulcer along the left posterolateral wall in the infrarenal segment. Celiac: Calcified ostial plaque resulting in short segment stenosis of moderate severity, atheromatous but patent distally. SMA: Significant partially calcified atheromatous  plaque extending from the ostium approximally 2.4 cm, with progression to at least moderately severe stenosis since 01/01/2022, with continued patency distally with classic distal branch anatomy. Renals: Single left, with near occlusive ostial plaque extending over length of at least 1.2 cm, patent but atheromatous distally. Single right, with heavily calcified atheromatous plaque through its length , of likely hemodynamic significance. IMA: Atheromatous but patent. RIGHT Lower Extremity Inflow: Common iliac proximal stent patent, distal stent with mild residual/recurrent narrowing at its proximal end, patent across the bifurcation. Internal iliac occluded at its origin External iliac overlapping stents through nearly its length, with moderate residual/recurrent stenosis at the distal margin at the common femoral artery. Outflow: Common femoral is mildly atheromatous but patent distally. Deep femoral branches patent proximally, segmentally occluded distally. SFA proximal occlusion extending throughz its length. Patent fem-pop graft, with some retrograde filling of the proximal native popliteal artery. Runoff: Venous opacification in the proximal calf limited evaluation of proximal tibial runoff. Scattered atheromatous calcifications and poor enhancement in the distal calf limiting assessment of distal trifurcation runoff. LEFT Lower Extremity  Inflow: Common iliac patent stents through its length Internal iliac origin occlusion External iliac patent stent through its length. Outflow: Common femoral has 1.1 cm thin-necked pseudoaneurysm from its proximal segment, patent distally. Deep femoral branches patent SFA origin occlusion obtaining throughout its length despite presence of stents. Popliteal is reconstituted proximally by collaterals, patent but moderately atheromatous through its distal length. Runoff: Atheromatous but apparently contiguous three-vessel tibial runoff to above the ankle, with extensive vascular calcifications and limited enhancement limiting assessment across the ankle and into the foot. Veins: No obvious venous abnormality within the limitations of this arterial phase study. Review of the MIP images confirms the above findings. NON-VASCULAR Lower chest: No pleural or pericardial effusion. Visualized lung bases clear. Hepatobiliary: No focal liver abnormality is seen. No gallstones, gallbladder wall thickening, or biliary dilatation. Pancreas: Unremarkable. No pancreatic ductal dilatation or surrounding inflammatory changes. Spleen: Normal in size without focal abnormality. 2 cm probable splenule abutting the pancreatic tail, stable since earliest available scan 06/18/2020. Adrenals/Urinary Tract: No adrenal mass. Marked bilateral renal parenchymal atrophy with extensive renal arterial branch calcifications. No hydronephrosis or mass. Urinary bladder is nondistended. Stomach/Bowel: Stomach is partially distended by gas and fluid, no acute findings. Small bowel is decompressed. Normal appendix. The colon is incompletely distended, with scattered diverticula at the descending/sigmoid junction, no adjacent inflammatory changes. Lymphatic: No abdominal or pelvic adenopathy. Reproductive: Mild prostate enlargement. Other: Subcutaneous gas bubbles overlying left common femoral vessels. There is a deep subcutaneous hematoma overlying the  left common femoral vessels with extension into the anterior extraperitoneal space of the pelvis, dominant component measuring 11.6 x 6.3 cm,, and an 8.3 cm right-sided component extending along the pelvic sidewall. No active extravasation is identified. No ascites or hemoperitoneum. No free air. Left pelvic phleboliths. Musculoskeletal: No acute findings. IMPRESSION: 1. 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 2. New anterior pelvic extraperitoneal hematoma measuring up to 11.6 cm diameter. No active extravasation evident. 3. Long segment LEFT SFA occlusion. Critical Value/emergent results were discussed by telephone at the time of interpretation on 08/28/2022 at 7:35 pm with provider [facility] , who verbally acknowledged these results. 4. Residual/recurrent stenosis at the distal margin of the RIGHT external iliac stent. 5. Patent RIGHT fem-pop graft 6. Patent LEFT iliac stents. 7. 4 mm penetrating atheromatous ulcer in the infrarenal abdominal aorta. 8. Colonic diverticulosis. 9. Aortic Atherosclerosis (ICD10-I70.0). Electronically Signed   By:  Lucrezia Europe M.D.   On: 08/28/2022 19:40   HYBRID OR IMAGING ([facility] ONLY)  Result Date: 08/22/2022 There is no interpretation for this exam.  This order is for images obtained during a surgical procedure.  Please See "Surgeries" Tab for more information regarding the procedure.   CT ABDOMEN PELVIS W CONTRAST  Result Date: 08/12/2022 CLINICAL DATA:  Abdominal pain, nausea, vomiting, diarrhea, recent diagnosis of C. difficile EXAM: CT ABDOMEN AND PELVIS WITH CONTRAST TECHNIQUE: Multidetector CT imaging of the abdomen and pelvis was performed using the standard protocol following bolus administration of intravenous contrast. RADIATION DOSE REDUCTION: This exam was performed according to the departmental dose-optimization program which includes automated exposure control, adjustment of the mA and/or kV according to patient size and/or use of  iterative reconstruction technique. CONTRAST:  62mL OMNIPAQUE IOHEXOL 350 MG/ML SOLN COMPARISON:  01/01/2022 FINDINGS: Lower chest: Cardiomegaly.  Trace bilateral pleural effusions. Hepatobiliary: No focal liver abnormality is seen. Hyperattenuating fluid within the gallbladder suggesting vicarious excretion of contrast. No pericholecystic inflammatory changes by CT. No biliary dilatation. Pancreas: Splenule is again noted abutting the distal pancreatic tail. Pancreas appears otherwise unremarkable. No pancreatic ductal dilatation or surrounding inflammatory changes. Spleen: Normal in size without focal abnormality. Adrenals/Urinary Tract: Adrenal glands are unremarkable. Kidneys are normal, without renal calculi, focal lesion, or hydronephrosis. Bladder is decompressed. Stomach/Bowel: Stomach within normal limits. No dilated loops of bowel. Long segment mild circumferential wall thickening of the sigmoid colon, and to a lesser degree involving the descending colon. There are numerous left-sided colonic diverticula, although no focally inflamed diverticulum is seen. A normal appendix is seen in the right lower quadrant. Vascular/Lymphatic: Severe aortoiliac atherosclerosis without aneurysm. No abdominopelvic lymphadenopathy. Reproductive: Prostate is unremarkable. Other: Trace perihepatic ascites. Small volume retroperitoneal fluid within the anterolateral aspect of the left hemipelvis measuring greater than fluid density. Approximate measurements of 6.5 x 1.8 x 5.1 cm (volume = 31 cm^3). No pneumoperitoneum. Musculoskeletal: No acute or significant osseous findings. IMPRESSION: 1. Small volume retroperitoneal fluid within the left hemipelvis measuring greater than fluid density, suggestive of a retroperitoneal hematoma of indeterminate etiology. 2. Long segment mild circumferential wall thickening of the sigmoid colon, and to a lesser degree involving the descending colon. Findings are suggestive of an infectious  or inflammatory colitis. 3. Trace perihepatic ascites. 4. Trace bilateral pleural effusions. 5. Severe aortoiliac atherosclerosis (ICD10-I70.0). Electronically Signed   By: Davina Poke D.O.   On: 08/12/2022 16:41   DG Chest Port 1 View  Result Date: 08/12/2022 CLINICAL DATA:  Shortness of breath EXAM: PORTABLE CHEST 1 VIEW COMPARISON:  CXR 07/21/22 FINDINGS: No pleural effusion. No pneumothorax. Unchanged cardiomegaly. Unchanged mediastinal contours. Focal airspace opacity. Redemonstrated are prominent bilateral interstitial opacities, which could represent pulmonary venous congestion or pulmonary edema. No radiographically apparent displaced rib fractures. Visualized upper abdomen is unremarkable. IMPRESSION: 1. Unchanged cardiomegaly with prominent bilateral interstitial opacities, which could represent pulmonary venous congestion or pulmonary edema. 2. No focal airspace opacity. Electronically Signed   By: Marin Roberts M.D.   On: 08/12/2022 14:41   PERIPHERAL VASCULAR CATHETERIZATION  Result Date: 08/10/2022 Table formatting from the original result was not included. Images from the original result were not included. PATIENT: DEJEAN DOSTAL      MRN: [id number] DOB: Apr 27, 1956    DATE OF PROCEDURE: 08/10/2022 INDICATIONS:  CALLETANO KURZWEIL is a 67 y.o. male who presented with rest pain of his left foot.  He has undergone a previous right iliofemoral endarterectomy and a right femoropopliteal  bypass.  He is also had bilateral iliac stents. PROCEDURE:  Conscious sedation Ultrasound-guided access to the left common femoral artery Aortogram with left lower extremity runoff Angioplasty and stenting of the left external iliac artery (7 mm x 5 cm Viabahn stent x 2; postdilatation with a 6 mm balloon) SURGEON: Judeth Cornfield. Scot Dock, MD, FACS ANESTHESIA: Local with sedation EBL: Minimal TECHNIQUE: The patient was brought to the peripheral vascular lab and was sedated. The period of conscious sedation was 105  minutes.  During that time period, I was present face-to-face 100% of the time.  The patient was administered initially 1 mg of Versed and 50 mcg of fentanyl. [name] heart rate, blood pressure, and oxygen saturation were monitored by the nurse continuously during the procedure. Both groins were prepped and draped in the usual sterile fashion.  Under ultrasound guidance, after the skin was anesthetized, I cannulated the left common femoral artery with a micropuncture needle and a micropuncture sheath was introduced over a wire.  By ultrasound the femoral artery was patent.  There was significant calcific disease in the common femoral artery.  A real-time image was obtained and sent to the server.This was exchanged for a 5 [place] sheath over a Bentson wire.  It was difficult to pass the wire and ultimately the wire and sheath came out.  I held pressure for 10 minutes.  There was no evidence of hematoma.  I then under ultrasound guidance again accessed the left common femoral artery with a micropuncture needle.  This time I used the Nitrex wire and was able to navigate into the common iliac artery and advanced the micropuncture sheath over the wire.  I then used an angled Glidewire to get up into the infrarenal aorta.  I exchanged this for a Rosen wire over a straight catheter.  I then placed a 5 French sheath in the left groin.  The pigtail catheter was positioned at the L1 vertebral body and flush aortogram obtained.  The cath was positioned above the aortic bifurcation and an oblique iliac projection was obtained.  The patient had significant disease in the external iliac artery on the left and I elected to address this with angioplasty and stenting.  I attempted to advance a 7 [place] destination sheath over the [facility] wire but met significant resistance.  Therefore the patient was heparinized.  ACT was monitored throughout the procedure.  I dilated the area of concern in the external iliac artery with a 5 mm  x 4 cm balloon.  At this point I was able to advance the 7 French sheath.  I then exchanged wires for a 0.018 wire.  I selected a 7 x 50 Viabahn stent.  This was positioned below the takeoff of the hypogastric artery which was occluded.  This was deployed without difficulty and postdilatation was done with a 6 mm balloon.  There was an area of concern the distal stent and I elected to cover this.  A second 7 mm x 50 mm Viabahn stent was overlapped to cover the area of concern and this was deployed without difficulty.  Postdilatation was done with a 6 mm balloon.  There was an excellent result with no residual stenosis. Runoff film was then obtained on the left.  At the at the completion we exchanged the long 7 French sheath for a short 7 [place] sheath which was easily advanced into the iliac system on the left at this point. FINDINGS: The infrarenal aorta is patent with no significant stenosis  identified.  The renal arteries are patent with no significant renal artery stenosis identified. On the right side the common iliac and external iliac arteries are patent. On the left side the common iliac artery is patent and it has previously been stented.  There was diffuse disease in the external iliac artery with up to an 80% stenosis.  This was successfully addressed with angioplasty and stenting as described above with no residual stenosis. Low that the common femoral had calcific disease but was patent.  The deep femoral artery is patent.  The superficial femoral artery is occluded at its origin with reconstitution of the above-knee popliteal artery.  The patient has diffuse tibial disease.  The dominant runoff is the peroneal artery which occludes above the foot.  The anterior tibial artery has diffuse disease proximally and then occludes in the mid leg.  There is reconstitution of the distal anterior tibial and dorsalis pedis artery on the left.  The posterior tibial artery is occluded and reconstitutes above the  ankle with moderate disease. CLINICAL NOTE: Hopefully by addressing the inflow this will help with his rest pain.  Otherwise we would have to consider infrainguinal bypass.  He has diffuse calcific disease in the common femoral artery and the stent extends very low.  I would like to avoid bypass if possible.  However when he returns for a follow-up visit we will obtain vein mapping.  He has not been taking aspirin or a statin.  He was started on 81 mg of aspirin, 75 mg of Plavix daily, and 10 mg of Crestor daily.  This was sent to his pharmacy. WIfI 0 1 0 Clinical stage I [name], MD, FACS [facility] [place]     PHYSICAL EXAM  Temp:  [97.5 F (36.4 C)-99.1 F (37.3 C)] 97.7 F (36.5 C) (03/30 1113) Pulse Rate:  [69-89] 70 (03/30 1200) Resp:  [13-34] 22 (03/30 1200) BP: (95-175)/(41-107) 113/51 (03/30 1200) SpO2:  [63 %-100 %] 96 % (03/30 1200) Weight:  [57.3 kg] 57.3 kg (03/30 0500)  General - Well nourished, well developed, in no apparent distress.  Ophthalmologic - fundi not visualized due to noncooperation.  Cardiovascular - Regular rhythm and rate.  Mental Status -  Level of arousal and orientation to time, place, and person were intact. Language including expression, naming, repetition, comprehension was assessed and found intact. Fund of Knowledge was assessed and was intact.  Cranial Nerves II - XII - II - Visual field intact OU. III, IV, VI - Extraocular movements intact. V - Facial sensation intact bilaterally. VII - Facial movement intact bilaterally. VIII - Hearing & vestibular intact bilaterally. X - Palate elevates symmetrically. XI - Chin turning & shoulder shrug intact bilaterally. XII - Tongue protrusion intact.  Motor Strength - [name] strength was normal in b/l upper extremities and pronator drift was absent.  LLE 2+/5 and RLE 3/5 proximal and 3+/5 distally. Bulk was normal and fasciculations were absent.   Motor Tone  - Muscle tone was assessed at the neck and appendages and was normal.  Reflexes - [name] reflexes were 1+ decreased in all extremities and he had no pathological reflexes.  Sensory - Light touch, temperature/pinprick were assessed and were symmetrical. However, significant tenderness on touch for left calf and foot. Less tenderness at right foot.    Coordination - The patient had normal movements in the hands with no ataxia or dysmetria.  Tremor was absent.  Gait and Station - deferred.  ASSESSMENT/PLAN Mr. PEPE LATONA is a 67 y.o. male with history of ESRD on HD, PAD, HTN, HLD admitted for abdominal pain. Found to have left femoral psudoaneurysm s/p repair. Also found RUE AVG occlusion s/p failed attempt of thrombectomy. Found confusion and left leg weakness, MRI showed incidental lacunar infarct.    Left leg weakness and pain S/p left femoral artery pseudoaneurysm repair 3/22 Now reporting left leg weakness with pain and tenderness on touch  Left LE weak pedal doppler signals per VVS but no dorsalis pedis signal at left foot per RN. 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 On hold now given acute anemia. Ongoing aggressive stroke risk factor management Therapy recommendations:  pending Disposition:  pending  PVD Hx of common and left external iliac artery stenting S/p left femoral artery  pseudoaneurysm repair 3/22 S/p AVG thrombectomy attempt but patient became hemodynamically unstable and procedure was aborted.  VVS on board Plan for dialysis catheter on Monday  Bacteremia  Hx of possible osteomyelitis  C-spine MRI showed improved edema paraspinal tissue concerning for evidence of prior osteomyelitis at cervical spine.  B Cx + for MRSA likely due to LLE post op infection Leukocytosis WBC 22.3->19.4->19.9 ID on board On vancomycin TEE pending to rule out endocarditis  Acute anemia Back pain Pt complaining of lower back pain this am Hb 7.5->7.8->9.2->8.1->9.1->7.5 S/p left femoral artery pseudoaneurysm 3/22 CT abdomen stat to rule out retroperitoneal bleeding  Hypertension Stable Avoid low BP Long term BP goal normotensive  Hyperlipidemia Home meds:  crestor 10 but pt not taking  LDL pending, goal < 70 Will decide statin based on lipid panel  Other Stroke Risk Factors Advanced age Former Cigarette smoker ESRD on HD - AVG occluded s/p thrombectomy attempts, not successful, pending catheter on Monday.   Other Active Problems Hyponatremia - Na 131  Hospital day # 8  This patient is critically ill due to left leg pain, bacteremia, stroke, left leg vasculopathy, acute anemia, PVD and at significant risk of neurological worsening, death form ischemic leg, recurrent stroke, endocarditis, sepsis. This patient's care requires constant monitoring of vital signs, hemodynamics, respiratory and cardiac monitoring, review of multiple databases, neurological assessment, discussion with family, other specialists and medical decision making of high complexity. I spent 40 minutes of neurocritical care time in the care of this patient. I had long discussion with daughter and pt at bedside, updated pt current condition, treatment plan and potential prognosis, and answered all the questions. They expressed understanding and appreciation.      Rosalin Hawking, MD PhD Stroke  Neurology 09/05/2022 12:48 PM    To contact Stroke Continuity provider, please refer to [email or web address] After hours, contact General Neurology

## 2022-09-05 NOTE — Progress Notes (Signed)
    Subjective  - POD #8,3  States he is feeling better today   Physical Exam:  Pedal Doppler signals, right greater than left Prevena wound VAC with good seal and left groin      Assessment/Plan:  POD #3/8  Blood cultures remain negative.  There is concern for embolic stroke with possible cardiac source.  Plan for dialysis catheter on Monday.  If he needs dialysis sooner, he would benefit from a temporary catheter over the weekend  Albert Harris 09/05/2022 12:34 PM --  Vitals:   09/05/22 1113 09/05/22 1200  BP:  (!) 113/51  Pulse:  70  Resp:  (!) 22  Temp: 97.7 F (36.5 C)   SpO2:  96%    Intake/Output Summary (Last 24 hours) at 09/05/2022 1234 Last data filed at 09/05/2022 0900 Gross per 24 hour  Intake 896.59 ml  Output --  Net 896.59 ml     Laboratory CBC    Component Value Date/Time   WBC 19.9 (H) 09/05/2022 0053   HGB 7.5 (L) 09/05/2022 0053   HGB 10.4 (L) 06/18/2021 1431   HCT 22.9 (L) 09/05/2022 0053   HCT 31.6 (L) 06/18/2021 1431   PLT 199 09/05/2022 0053   PLT 142 (L) 06/18/2021 1431    BMET    Component Value Date/Time   NA 131 (L) 09/05/2022 0053   NA 141 06/18/2021 1431   K 3.8 09/05/2022 0053   CL 95 (L) 09/05/2022 0053   CO2 21 (L) 09/05/2022 0053   GLUCOSE 100 (H) 09/05/2022 0053   BUN 61 (H) 09/05/2022 0053   BUN 20 06/18/2021 1431   CREATININE 5.41 (H) 09/05/2022 0053   CREATININE 1.15 02/19/2017 1049   CALCIUM 8.3 (L) 09/05/2022 0053   CALCIUM 8.5 (L) 06/10/2020 0900   GFRNONAA 11 (L) 09/05/2022 0053   GFRAA 6 (L) 03/04/2020 1045    COAG Lab Results  Component Value Date   INR 1.2 08/12/2022   INR 1.3 (H) 06/29/2022   INR 1.3 (H) 06/28/2022   No results found for: "PTT"  Antibiotics Anti-infectives (From admission, onward)    Start     Dose/Rate Route Frequency Ordered Stop   09/02/22 1227  ceFAZolin (ANCEF) 2-4 GM/100ML-% IVPB       Note to Pharmacy: Dallie Piles, [place]: cabinet override      09/02/22 1227  09/03/22 0044   09/02/22 0520  vancomycin variable dose per unstable renal function (pharmacist dosing)  Status:  Discontinued         Does not apply See admin instructions 09/02/22 0521 09/05/22 0940   08/31/22 1800  vancomycin (VANCOREADY) IVPB 750 mg/150 mL  Status:  Discontinued        750 mg 150 mL/hr over 60 Minutes Intravenous Every 24 hours 08/31/22 0822 09/02/22 0521   08/30/22 1800  vancomycin (VANCOREADY) IVPB 1500 mg/300 mL        1,500 mg 150 mL/hr over 120 Minutes Intravenous STAT 08/30/22 1739 08/30/22 2139        V. Leia Alf, M.D., [facility] [place] Office: [phone] Pager:  [phone]

## 2022-09-05 NOTE — Progress Notes (Signed)
PCCM  Patient with worsening abdominal pain and noted Hg decreased from 9>7 this am. Last transfusion PRBC Heparin was discontinued.  STAT CT A/P with previously seen anterior pelvic extraperitoneal hematoma with increase in left pelvic fluid concerning for hemorrhage. RN reported SBP dropped to 90s after being normotensive >48 hours.   Vascular surgery consulted. Evaluated at bedside with PCCM. Exam benign, no indication for urgent OR evaluation. Will plan to medically manage with IVF, transfusion if needed. Heparin discontinued with no plan for restart at this time.   If he decompensates will need CT A/P with contrast. Will obtain US guided in anticipation for this.    The patient is critically ill with acute hypotension and acute blood loss anemia and requires high complexity decision making for assessment and support, frequent evaluation and titration of therapies, application of advanced monitoring technologies and extensive interpretation of multiple databases.  Independent Critical Care Time: 30 Minutes.   Rodman Pickle, M.D. [facility] Pulmonary/Critical Care Medicine 09/05/2022 2:34 PM   Please see Amion for pager number to reach on-call Pulmonary and Critical Care Team.

## 2022-09-05 NOTE — Progress Notes (Signed)
An USGPIV (ultrasound guided PIV) has been placed for short-term vasopressor infusion. A correctly placed ivWatch must be used when administering Vasopressors. Should this treatment be needed beyond 72 hours, central line access should be obtained.  It will be the responsibility of the bedside nurse to follow best practice to prevent extravasations.   

## 2022-09-05 NOTE — Consult Note (Signed)
NEUROLOGY CONSULTATION NOTE   Date of service: September 05, 2022 Patient Name: Albert Harris MRN:  [id number] DOB:  08/19/1955 Reason for consult: "30mm stroke on MRI Brain" Requesting Provider: Margaretha Seeds, MD _ _ _   _ __   _ __ _ _  __ __   _ __   __ _  History of Present Illness  Albert Harris is a 67 y.o. male with PMH significant for ESRD on HD, PAD, COPD, HLD, HTN p/w abdominal pain, left leg numbness. Found to have L femoral pseudoaneurysm and s/po repair and RUE AVG occlusion s/p thrombectomy.  He was noted to have intermittent confusion and LLE weakness with no significant LLE pain or swelling. This prompted workup with MRI L spine which was negative. An MRI Brain was obtained and demonstrated a small 33mm stroke.  LKW: unknown, no deficit mRS: 3 tNKASE: not offered, outside window Thrombectomy: not offered, stroke not consistent with LVO NIHSS components Score: Comment  1a Level of Conscious 0[x]  1[]  2[]  3[]      1b LOC Questions 0[x]  1[]  2[]       1c LOC Commands 0[x]  1[]  2[]       2 Best Gaze 0[x]  1[]  2[]       3 Visual 0[x]  1[]  2[]  3[]      4 Facial Palsy 0[x]  1[]  2[]  3[]      5a Motor Arm - left 0[x]  1[]  2[]  3[]  4[]  UN[]    5b Motor Arm - Right 0[x]  1[]  2[]  3[]  4[]  UN[]    6a Motor Leg - Left 0[]  1[]  2[]  3[x]  4[]  UN[]    6b Motor Leg - Right 0[x]  1[]  2[]  3[]  4[]  UN[]    7 Limb Ataxia 0[x]  1[]  2[]  3[]  UN[]     8 Sensory 0[x]  1[]  2[]  UN[]      9 Best Language 0[x]  1[]  2[]  3[]      10 Dysarthria 0[x]  1[]  2[]  UN[]      11 Extinct. and Inattention 0[x]  1[]  2[]       TOTAL: 3     ROS   Constitutional Denies weight loss, fever and chills.   HEENT Denies changes in vision and hearing.   Respiratory Denies SOB and cough.   CV Denies palpitations and CP   GI Denies abdominal pain, nausea, vomiting and diarrhea.   GU Denies dysuria and urinary frequency.   MSK Denies myalgia and joint pain.   Skin Denies rash and pruritus.   Neurological Denies headache and syncope.    Psychiatric Denies recent changes in mood. Denies anxiety and depression.    Past History   Past Medical History:  Diagnosis Date   Acute metabolic encephalopathy [id number]   Acute on chronic diastolic CHF (congestive heart failure) ([place]) 12/15/2020   Acute respiratory disease due to COVID-19 virus 06/16/2020   Alcohol use disorder 06/30/2022   Anemia    Asthma    Bacteremia due to Gram-positive bacteria 05/09/2021   Carotid stenosis 09/12/2019   CHF (congestive heart failure) (HCC)    Claudication in peripheral vascular disease ([place]) [id number]   Complication of anesthesia    " i HAVE A HARD TIME WAKING UP " (only one time)   COPD (chronic obstructive pulmonary disease) ([place])    COVID-19    positive on  06/16/20   Critical limb ischemia of right lower extremity (HCC)    ESRD on hemodialysis ([place])    t, th. sat dialysis   GERD (gastroesophageal reflux disease)    HLD (hyperlipidemia)    Hypertension  Hypertensive urgency 12/24/2016   Lower GI bleed    Nausea vomiting and diarrhea 08/13/2022   Peripheral vascular disease (HCC)    PFO with atrial septal aneurysm    by echo 11/2017   Pneumonia    Stroke [facility])    Surgical wound infection 09/20/2021   [name] (transient ischemic attack) 11/2017   Tobacco abuse    quit 07/17/19   Volume overload 12/14/2020   Past Surgical History:  Procedure Laterality Date   ABDOMINAL AORTOGRAM W/LOWER EXTREMITY N/A 06/21/2020   Procedure: ABDOMINAL AORTOGRAM W/LOWER EXTREMITY;  Surgeon: Angelia Mould, MD;  Location: [place] CV LAB;  Service: Cardiovascular;  Laterality: N/A;   ABDOMINAL AORTOGRAM W/LOWER EXTREMITY Right 08/29/2021   Procedure: ABDOMINAL AORTOGRAM W/LOWER EXTREMITY;  Surgeon: Cherre Robins, MD;  Location: [place] [facility];  Service: Cardiovascular;  Laterality: Right;   ABDOMINAL AORTOGRAM W/LOWER EXTREMITY N/A 08/10/2022   Procedure: ABDOMINAL AORTOGRAM W/LOWER EXTREMITY;  Surgeon: Angelia Mould, MD;   Location: [place] [facility];  Service: Cardiovascular;  Laterality: N/A;   AORTOGRAM N/A 08/22/2022   Procedure: AORTOGRAM;  Surgeon: Waynetta Sandy, MD;  Location: [place];  Service: Vascular;  Laterality: N/A;   APPLICATION OF WOUND VAC Right 09/21/2021   Procedure: APPLICATION OF WOUND VAC;  Surgeon: Angelia Mould, MD;  Location: [place];  Service: Vascular;  Laterality: Right;   APPLICATION OF WOUND VAC Left 08/28/2022   Procedure: APPLICATION OF [place] WITH PLACEMENT OF MYRIAD Tool;  Surgeon: Broadus John, MD;  Location: [place];  Service: Vascular;  Laterality: Left;   ARTERY REPAIR Left 08/28/2022   Procedure: LEFT EXTERNAL ILIAC ARTERY REPAIR;  Surgeon: Broadus John, MD;  Location: [place];  Service: Vascular;  Laterality: Left;   AV FISTULA PLACEMENT Left 03/28/2020   Procedure: ARTERIOVENOUS (AV) FISTULA CREATION LEFT;  Surgeon: [name], MD;  Location: [place];  Service: Vascular;  Laterality: Left;   AV FISTULA PLACEMENT Right 09/23/2020   Procedure: RIGHT Arm ARTERIOVENOUS GRAFT CREATION.;  Surgeon: Angelia Mould, MD;  Location: [place];  Service: Vascular;  Laterality: Right;   COLONOSCOPY     COLONOSCOPY WITH PROPOFOL N/A 06/27/2020   Procedure: COLONOSCOPY WITH PROPOFOL;  Surgeon: Carol Ada, MD;  Location: [place];  Service: Endoscopy;  Laterality: N/A;   DRAINAGE AND CLOSURE OF LYMPHOCELE Right 09/21/2021   Procedure: EVACUATION OF LYMPHOCELE RIGHT GROIN;  Surgeon: Angelia Mould, MD;  Location: [place];  Service: Vascular;  Laterality: Right;   ENDARTERECTOMY FEMORAL Right 09/05/2021   Procedure: ENDARTERECTOMY FEMORAL;  Surgeon: Angelia Mould, MD;  Location: [place];  Service: Vascular;  Laterality: Right;   FEMORAL-POPLITEAL BYPASS GRAFT Right 09/05/2021   Procedure: RIGHT FEMORAL-POPLITEAL ARTERY BYPASS WITH Spaphenous Vein;  Surgeon: Angelia Mould, MD;  Location: [facility] OR;  Service: Vascular;   Laterality: Right;   Albert Harris  06/27/2020   Procedure: HEMOSTASIS CLIP PLACEMENT;  Surgeon: Carol Ada, MD;  Location: [place];  Service: Endoscopy;;   INSERTION OF DIALYSIS CATHETER Right 06/24/2020   Procedure: INSERTION OF TUNNELED  DIALYSIS CATHETER; Removal of temporary dialysis catheter right neck;  Surgeon: Angelia Mould, MD;  Location: [place];  Service: Vascular;  Laterality: Right;   INSERTION OF ILIAC STENT Left 08/22/2022   Procedure: INSERTION OF ILIAC STENT;  Surgeon: Waynetta Sandy, MD;  Location: [place];  Service: Vascular;  Laterality: Left;   IR PARACENTESIS  06/30/2022   LIGATION OF ARTERIOVENOUS  FISTULA Left 07/24/2020   Procedure: LIGATION OF LEFT BRACHIOCEPHALIC ARTERIOVENOUS  FISTULA;  Surgeon: [name], MD;  Location: [place];  Service: Vascular;  Laterality: Left;   PERIPHERAL VASCULAR INTERVENTION Right 06/21/2020   Procedure: PERIPHERAL VASCULAR INTERVENTION;  Surgeon: Angelia Mould, MD;  Location: [place] [facility];  Service: Cardiovascular;  Laterality: Right;   PERIPHERAL VASCULAR INTERVENTION Left 08/10/2022   Procedure: PERIPHERAL VASCULAR INTERVENTION;  Surgeon: Angelia Mould, MD;  Location: [place] [facility];  Service: Cardiovascular;  Laterality: Left;  External Illiac   REVISION OF ARTERIOVENOUS GORETEX GRAFT Right 09/02/2022   Procedure: REVISION OF RIGHT ARTERIOVENOUS GORETEX GRAFT;  Surgeon: Broadus John, MD;  Location: [place];  Service: Vascular;  Laterality: Right;   TEE WITHOUT CARDIOVERSION N/A 05/13/2021   Procedure: TRANSESOPHAGEAL ECHOCARDIOGRAM (TEE);  Surgeon: Donato Heinz, MD;  Location: [facility] ENDOSCOPY;  Service: Cardiovascular;  Laterality: N/A;   THROMBECTOMY FEMORAL ARTERY Left 08/22/2022   Procedure: AORTOILIAC THROMBECTOMY, COMMON FEMORAL ENDARTERECTOMY,;  Surgeon: Waynetta Sandy, MD;  Location: [facility] OR;  Service: Vascular;  Laterality: Left;    VEIN HARVEST Right 09/05/2021   Procedure: RIGHT Saphenous VEIN HARVEST;  Surgeon: Angelia Mould, MD;  Location: [facility] OR;  Service: Vascular;  Laterality: Right;   Family History  Problem Relation Age of Onset   Hypertension Mother    Colon cancer Father    Stomach cancer Brother    Social History   Socioeconomic History   Marital status: Divorced    Spouse name: Not on file   Number of children: Not on file   Years of education: Not on file   Highest education level: Not on file  Occupational History   Not on file  Tobacco Use   Smoking status: Former    Packs/day: 1.00    Years: 50.00    Additional pack years: 0.00    Total pack years: 50.00    Types: Cigarettes    Quit date: 07/17/2019    Years since quitting: 3.1   Smokeless tobacco: Never  Vaping Use   Vaping Use: Never used  Substance and Sexual Activity   Alcohol use: Not Currently    Alcohol/week: 1.0 - 3.0 standard drink of alcohol    Types: 1 - 3 Cans of beer per week   Drug use: No   Sexual activity: Not Currently  Other Topics Concern   Not on file  Social History Narrative   Married   Works in Government social research officer and flooring   Social Determinants of Health   Financial Resource Strain: [place]  (09/12/2019)   Overall Financial Resource Strain (CARDIA)    Difficulty of Paying Living Expenses: Not hard at all  Food Insecurity: Food Insecurity Present (08/22/2022)   Hunger Vital Sign    Worried About Running Out of Food in the Last Year: Sometimes true    Ran Out of Food in the Last Year: Sometimes true  Transportation Needs: Unmet Transportation Needs (08/22/2022)   PRAPARE - Hydrologist (Medical): Yes    Lack of Transportation (Non-Medical): No  Physical Activity: Not on file  Stress: No Stress Concern Present (09/12/2019)   [place]    Feeling of Stress : Not at all  Social Connections: Not on file    Allergies  Allergen Reactions   Benadryl [Diphenhydramine] Rash   Lidocaine-Prilocaine Rash    Caused  red marks up and down arm    Medications   Facility-Administered Medications Prior to Admission  Medication Dose Route Frequency Provider Last Rate Last Admin   [DISCONTINUED] sodium chloride flush (NS) 0.9 % injection 3 mL  3 mL Intravenous Q12H Angelia Mould, MD       Medications Prior to Admission  Medication Sig Dispense Refill Last Dose   albuterol (VENTOLIN HFA) 108 (90 Base) MCG/ACT inhaler INHALE 1-2 PUFFS INTO THE LUNGS EVERY 6 (SIX) HOURS AS NEEDED FOR WHEEZING OR SHORTNESS OF BREATH. 8 g 0    aspirin EC 81 MG tablet Take 1 tablet (81 mg total) by mouth daily. Swallow whole. (Patient not taking: Reported on 08/22/2022) 150 tablet 1    B Complex-C-Zn-Folic Acid (DIALYVITE/ZINC) TABS Take 1 tablet by mouth daily. (Patient not taking: Reported on 08/22/2022)      calcium acetate (PHOSLO) 667 MG capsule Take 2 capsule by mouth three times a day with meals (Patient taking differently: Take 1,334 mg by mouth 3 (three) times daily with meals.) 540 capsule 3    clopidogrel (PLAVIX) 75 MG tablet Take 1 tablet (75 mg total) by mouth daily. 30 tablet 0    hydrOXYzine (ATARAX) 10 MG tablet Take 10 mg by mouth 3 (three) times daily.      metoprolol succinate (TOPROL-XL) 25 MG 24 hr tablet Take 1 tablet (25 mg total) by mouth daily. 30 tablet 0    rosuvastatin (CRESTOR) 10 MG tablet Take 1 tablet (10 mg total) by mouth daily. (Patient not taking: Reported on 08/13/2022) 30 tablet 11      Vitals   Vitals:   09/04/22 2324 09/04/22 2330 09/05/22 0000 09/05/22 0030  BP:  113/61 127/62 138/62  Pulse:  77 73 75  Resp:  18 (!) 28 (!) 31  Temp: 98.1 F (36.7 C)     TempSrc: Oral     SpO2:  98% 98% 98%  Weight:      Height:         Body mass index is 18.97 kg/m.  Physical Exam   General: Laying comfortably in bed; in no acute distress.  HENT: Normal oropharynx and mucosa.  Normal external appearance of ears and nose.  Neck: Supple, no pain or tenderness  CV: No JVD. No peripheral edema.  Pulmonary: Symmetric Chest rise. Normal respiratory effort.  Abdomen: Soft to touch, non-tender.  Ext: No cyanosis, edema, or deformity  Skin: No rash. Normal palpation of skin.   Musculoskeletal: Normal digits and nails by inspection. No clubbing.   Neurologic Examination  Mental status/Cognition: Alert, oriented to self, place, month and year, good attention.  Speech/language: Fluent, comprehension intact, object naming intact, repetition intact.  Cranial nerves:   CN II Pupils equal and reactive to light, no VF deficits    CN III,IV,VI EOM intact, no gaze preference or deviation, no nystagmus    CN V normal sensation in V1, V2, and V3 segments bilaterally    CN VII no asymmetry, no nasolabial fold flattening    CN VIII normal hearing to speech    CN IX & X normal palatal elevation, no uvular deviation    CN XI 5/5 head turn and 5/5 shoulder shrug bilaterally    CN XII midline tongue protrusion    Motor:  Muscle bulk: normal, tone normal, pronator drift none. Mvmt Root Nerve  Muscle Right Left Comments  SA C5/6 Ax Deltoid 5 5   EF C5/6 Mc Biceps 5 5   EE C6/7/8  Rad Triceps 5 5   WF C6/7 Med FCR     WE C7/8 PIN ECU     F Ab C8/T1 U ADM/FDI 5 5   HF L1/2/3 Fem Illopsoas 4+ 2   KE L2/3/4 Fem Quad 5 2   DF L4/5 D Peron Tib Ant 5 2   PF S1/2 Tibial Grc/Sol 5 2    Reflexes:  Right Left Comments  Pectoralis      Biceps (C5/6) 2 2   Brachioradialis (C5/6) 2 2    Triceps (C6/7) 2 2    Patellar (L3/4) 3 3 Cross adductors + BL   Achilles (S1)      [place]man      Plantar     Jaw jerk    Sensation:  Light touch Intact throughout   Pin prick    Temperature    Vibration   Proprioception    Coordination/Complex Motor:  - Finger to Nose intact BL - Heel to shin unable to do - Rapid alternating movement intact BL - Gait: deferred for patient safety.  Labs    CBC:  Recent Labs  Lab 09/04/22 0838 09/05/22 0053  WBC 19.4* 19.9*  HGB 9.1* 7.5*  HCT 28.9* 22.9*  MCV 96.3 96.2  PLT 216 [id number]    Basic Metabolic Panel:  Lab Results  Component Value Date   NA 134 (L) 09/04/2022   K 3.7 09/04/2022   CO2 24 09/04/2022   GLUCOSE 90 09/04/2022   BUN 48 (H) 09/04/2022   CREATININE 4.21 (H) 09/04/2022   CALCIUM 8.7 (L) 09/04/2022   GFRNONAA 15 (L) 09/04/2022   GFRAA 6 (L) 03/04/2020   Lipid Panel:  Lab Results  Component Value Date   LDLCALC 79 08/23/2022   HgbA1c:  Lab Results  Component Value Date   HGBA1C 5.0 08/29/2021   Urine Drug Screen:     Component Value Date/Time   LABOPIA NONE DETECTED 07/18/2019 0603   COCAINSCRNUR NONE DETECTED 07/18/2019 0603   LABBENZ NONE DETECTED 07/18/2019 0603   AMPHETMU NONE DETECTED 07/18/2019 0603   THCU NONE DETECTED 07/18/2019 0603   LABBARB NONE DETECTED 07/18/2019 0603    Alcohol Level     Component Value Date/Time   ETH <10 05/06/2021 0059    CT Head without contrast(Personally reviewed): CTH was negative for a large hypodensity concerning for a large territory infarct or hyperdensity concerning for an ICH  MR Angio head without contrast and Carotid Duplex BL(Personally reviewed): pending  MRI Brain(Personally reviewed): 30mm right subinsular white matter infarct.  MRI L spine(Personally reviewed): 1. Motion limited study without evidence of significant canal or foraminal stenosis. 2. At L5-S1, small left paracentral disc protrusion which comes in close proximity to the exiting left L5 nerve without impingement.  Impression   Albert Harris is a 67 y.o. male with PMH significant for ESRD on HD, PAD, COPD, HLD, HTN p/w abdominal pain, left leg numbness. Found to have L femoral pseudoaneurysm and s/po repair and RUE AVG occlusion s/p thrombectomy.  He does have LLE weakness with no obvious pain, edema, swelling. This prompted further workup with MRI Brain and L spine which  demonstrate a 41mm right subinsular stroke.The weakness in the LLE is certainly out of proportion to what I would typically expect from arterial insufficiency, no obvious claudication or pain, or concern for extensive myonecrosis/compartment syndrome. Perhaps, he had low flow to the vasanervorum?, but would still be very odd since he reports intact sensation in his LLE. Weakness is also  out of proportion to what I would expect from critical illness myopathy. No obvious dermatomal pattern.  I think from an inpatient standpoint, the next steps in workup would be getting MRI C and T spine to rule out central etiology along with stroke workup for the noted incidental stroke. He would need outpatient EMG/NCS if the MRI C and T spine are non revealing.  Recommendations  - MRI C and T spine w/o C - Frequent Neuro checks per stroke unit protocol - Recommend Vascular imaging with MRA Angio Head without contrast and US Carotid doppler - Recommend obtaining TTE - Recommend obtaining Lipid panel with LDL - Please start statin if LDL > 70 - Recommend HbA1c to evaluate for diabetes and how well it is controlled. - on heparin gtt, Asa when off heparin. - SBP goal - gradual normotension - Recommend Telemetry monitoring for arrythmia - Recommend bedside swallow screen prior to PO intake. - Stroke education booklet - Recommend PT/OT/SLP consult - stroke team to follow - outpatient EMG/NCS.  ______________________________________________________________________   Thank you for the opportunity to take part in the care of this patient. If you have any further questions, please contact the neurology consultation attending.  Signed,  [place] Pager Number [phone] _ _ _   _ __   _ __ _ _  __ __   _ __   __ _

## 2022-09-05 NOTE — Progress Notes (Signed)
I came back to evaluate the patient because he was complaining of worsening lower midline abdominal pain and had a decrease in his hemoglobin from 9-7.  A CT scan was repeated by CCM that continues to show an anterior pelvic extraperitoneal hematoma that was slightly larger.  His blood pressures have also been soft, dropping into the 90s.  He did respond to IV fluids.  On examination, I remove the Prevena wound VAC from his left groin.  There was no hematoma.  He did not have any incisional tenderness in the left groin.  He had mild suprapubic tenderness but did not appear to be terribly uncomfortable.  At this point we have decided on nonoperative management.  We will stop his heparin drip.  If he has any further decline, CT angiogram of the abdomen pelvis will be obtained to evaluate for active bleeding.  Annamarie Major

## 2022-09-05 NOTE — Progress Notes (Signed)
Nephrology Follow-Up Consult note   Assessment/Recommendations: Albert Harris is a/an 67 y.o. male with a past medical history significant for ESRD, PAD, CHF, HTN, admitted for critical limb ischemia.       ESRD: TTS and [place] via RUE AVG. was initially on CRRT given severe hemodynamic compromise.  Stopped CRRT on the morning of 3/26.  Received dialysis morning of 09/03/2022.  Hopefully hold off on dialysis until Monday. Tt that time can get tunneled dialysis catheter if infection has improved and cleared from ID perspective  Left common femoral artery pseudoaneurysm: Status post surgery with Dr. Unk Lightning on the morning of 08/29/2022 status post repair.  Overall much improved  CVA: stroke on MRI. Neuro following  Shock/MRSA Bacteremia: Likely multifactorial with infectious component. MRSA treatment per primary.  Shock resolved. TEE when able   Anemia: Likely multifactorial.  No IV iron given bacteremia. ESA ordered.  Dialysis access: AVG clotted attempted declot on 3/27 but unsuccessful. Temp cath removed 3/28. Hopefully for [facility] on Monday if ok from ID perspective   Recommendations conveyed to primary service.    [facility] 09/05/2022 7:59 AM  ___________________________________________________________  CC: Femoral artery pseudoaneurysm  Interval History/Subjective: Patient voices no complaints. Concern for stroke yesterday with LLE weakness and MRI with small infarct. TEE rescheduled.   Medications:  Current Facility-Administered Medications  Medication Dose Route Frequency Provider Last Rate Last Admin   (feeding supplement) PROSource Plus liquid 30 mL  30 mL Oral BID BM Baglia, Corrina, PA-C   30 mL at 09/03/22 1648   0.9 %  sodium chloride infusion (Manually program via Guardrails IV Fluids)   Intravenous Once Baglia, Corrina, PA-C       0.9 %  sodium chloride infusion   Intra-arterial PRN Baglia, Corrina, PA-C       0.9 %  sodium chloride  infusion  250 mL Intravenous Continuous Baglia, Corrina, PA-C   Stopped at 09/01/22 0400   acetaminophen (TYLENOL) tablet 650 mg  650 mg Oral Q6H PRN Baglia, Corrina, PA-C       Or   acetaminophen (TYLENOL) suppository 650 mg  650 mg Rectal Q4H PRN Baglia, Corrina, PA-C       alteplase (CATHFLO ACTIVASE) injection 2 mg  2 mg Intracatheter Once PRN Baglia, Corrina, PA-C       Chlorhexidine Gluconate Cloth 2 % PADS 6 each  6 each Topical Q0600 Baglia, Corrina, PA-C   6 each at 09/05/22 0758   Darbepoetin Alfa (ARANESP) injection 100 mcg  100 mcg Subcutaneous Q Wed-1800 Baglia, Corrina, PA-C   100 mcg at 09/02/22 1731   docusate sodium (COLACE) capsule 100 mg  100 mg Oral BID PRN Baglia, Corrina, PA-C       feeding supplement (NEPRO CARB STEADY) liquid 237 mL  237 mL Oral BID BM Baglia, Corrina, PA-C   237 mL at 09/03/22 1648   heparin ADULT infusion 100 units/mL (25000 units/234mL)  2,100 Units/hr Intravenous Continuous Mannam, Praveen, MD   Stopped at 09/05/22 0758   heparin injection 1,000 Units  1,000 Units Dialysis PRN Baglia, Corrina, PA-C       hydrALAZINE (APRESOLINE) injection 10 mg  10 mg Intravenous Q4H PRN Baglia, Corrina, PA-C   10 mg at 09/04/22 1402   HYDROmorphone (DILAUDID) injection 0.5 mg  0.5 mg Intravenous Q8H PRN Margaretha Seeds, MD   0.5 mg at 09/05/22 0440   multivitamin (RENA-VIT) tablet 1 tablet  1 tablet Oral QHS Baglia, Corrina, PA-C   1  tablet at 09/04/22 2131   ondansetron (ZOFRAN) injection 4 mg  4 mg Intravenous Q6H PRN Baglia, Corrina, PA-C       Oral care mouth rinse  15 mL Mouth Rinse PRN Baglia, Corrina, PA-C       pantoprazole (PROTONIX) EC tablet 40 mg  40 mg Oral Daily Baglia, Corrina, PA-C   40 mg at 09/04/22 [id number]   pentafluoroprop-tetrafluoroeth (GEBAUERS) aerosol 1 Application  1 Application Topical PRN Baglia, Corrina, PA-C       polyethylene glycol (MIRALAX / GLYCOLAX) packet 17 g  17 g Oral Daily PRN Baglia, Corrina, PA-C       sodium chloride flush (NS)  0.9 % injection 10-40 mL  10-40 mL Intracatheter Q12H Baglia, Corrina, PA-C   10 mL at 09/04/22 1000   sodium chloride flush (NS) 0.9 % injection 10-40 mL  10-40 mL Intracatheter PRN Baglia, Corrina, PA-C       vancomycin variable dose per unstable renal function (pharmacist dosing)   Does not apply See admin instructions Baglia, Corrina, PA-C          Review of Systems: 10 systems reviewed and negative except per HPI  Physical Exam: Vitals:   09/05/22 0630 09/05/22 0734  BP: 139/64   Pulse: 71   Resp: (!) 25   Temp:  98.2 F (36.8 C)  SpO2: 99%    No intake/output data recorded.  Intake/Output Summary (Last 24 hours) at 09/05/2022 0759 Last data filed at 09/05/2022 0600 Gross per 24 hour  Intake 697.55 ml  Output --  Net 697.55 ml   Constitutional: lying in bed, nad ENMT: ears and nose without scars or lesions, MMM CV: normal rate, no edema Respiratory: bilateral chest rise, no iwob Gastrointestinal: soft, nonended Skin: no visible lesions or rashes Psych: mood and affect appropriate    Test Results I personally reviewed new and old clinical labs and radiology tests Lab Results  Component Value Date   NA 131 (L) 09/05/2022   K 3.8 09/05/2022   CL 95 (L) 09/05/2022   CO2 21 (L) 09/05/2022   BUN 61 (H) 09/05/2022   CREATININE 5.41 (H) 09/05/2022   GFR 58.30 (L) 01/19/2017   CALCIUM 8.3 (L) 09/05/2022   ALBUMIN 1.9 (L) 09/05/2022   PHOS 4.7 (H) 09/05/2022    CBC Recent Labs  Lab 09/03/22 0624 09/04/22 0838 09/05/22 0053  WBC 22.3* 19.4* 19.9*  HGB 8.1* 9.1* 7.5*  HCT 24.9* 28.9* 22.9*  MCV 92.6 96.3 96.2  PLT 181 216 199

## 2022-09-05 NOTE — Progress Notes (Signed)
ANTICOAGULATION CONSULT NOTE Pharmacy Consult for Heparin Indication:  lower extremity limb ischemia  Allergies  Allergen Reactions   Benadryl [Diphenhydramine] Rash   Lidocaine-Prilocaine Rash    Caused red marks up and down arm    Patient Measurements: Height: 5\' 8"  (172.7 cm) Weight: 57.3 kg (126 lb 5.2 oz) IBW/kg (Calculated) : 68.4 Heparin Dosing Weight: 64.4 kg  Vital Signs: Temp: 99.1 F (37.3 C) (03/30 0411) Temp Source: Oral (03/30 0411) BP: 139/64 (03/30 0630) Pulse Rate: 71 (03/30 0630)  Labs: Recent Labs    09/03/22 0624 09/03/22 0952 09/03/22 0952 09/03/22 1858 09/04/22 0838 09/04/22 1304 09/04/22 1538 09/05/22 0053  HGB 8.1*  --   --   --  9.1*  --   --  7.5*  HCT 24.9*  --   --   --  28.9*  --   --  22.9*  PLT 181  --   --   --  216  --   --  199  HEPARINUNFRC  --  0.41   < > 0.33 0.40  --   --  0.35  CREATININE  --  2.59*  --   --  4.21*  --   --  5.41*  TROPONINIHS  --   --   --   --   --  146* 134*  --    < > = values in this interval not displayed.    Assessment: 67 yr old male with hx LLE thromboembolectomy s/p left groin pseudoaneurysm repair and hematoma evacuation for heparin.   Underwent R arm AV loop graft embolectomy 3/27 - discussed with Dr Virl Cagey and plan to restart heparin in 4 hours. Was previously on heparin infusion at 2100 units/hr prior to procedure. Of note, did receive 10,000 units during procedure.  Heparin level therapeutic at 0.35. Hgb 7.5 down, PLT 199.   Goal of Therapy:  Heparin level 0.3-0.5 units/ml  Monitor platelets by anticoagulation protocol: Yes   Plan:  Continue heparin infusion at 2100 units/hr Check heparin level daily, CBC daily  Monitor for signs and symptoms of bleeding Closely monitor hematoma  Follow up long-term anticoagulation plan  Alanda Slim, PharmD, [facility] Clinical Pharmacist Please see AMION for all Pharmacists' Contact Phone Numbers 09/05/2022, 7:27 AM

## 2022-09-06 DIAGNOSIS — K683 Retroperitoneal hematoma: Secondary | ICD-10-CM

## 2022-09-06 DIAGNOSIS — I724 Aneurysm of artery of lower extremity: Secondary | ICD-10-CM | POA: Diagnosis not present

## 2022-09-06 DIAGNOSIS — I63311 Cerebral infarction due to thrombosis of right middle cerebral artery: Secondary | ICD-10-CM | POA: Diagnosis not present

## 2022-09-06 DIAGNOSIS — E872 Acidosis, unspecified: Secondary | ICD-10-CM | POA: Diagnosis not present

## 2022-09-06 LAB — TYPE AND SCREEN
ABO/RH(D): O POS
Antibody Screen: NEGATIVE
Unit division: 0
Unit division: 0

## 2022-09-06 LAB — CBC
HCT: 27.7 % — ABNORMAL LOW (ref 39.0–52.0)
HCT: 26.6 % — ABNORMAL LOW (ref 39.0–52.0)
Hemoglobin: 9.2 g/dL — ABNORMAL LOW (ref 13.0–17.0)
Hemoglobin: 8.6 g/dL — ABNORMAL LOW (ref 13.0–17.0)
MCH: 30.7 pg (ref 26.0–34.0)
MCH: 30.6 pg (ref 26.0–34.0)
MCHC: 33.2 g/dL (ref 30.0–36.0)
MCHC: 32.3 g/dL (ref 30.0–36.0)
MCV: 92.3 fL (ref 80.0–100.0)
MCV: 94.7 fL (ref 80.0–100.0)
Platelets: 189 10*3/uL (ref 150–400)
Platelets: 202 10*3/uL (ref 150–400)
RBC: 3 MIL/uL — ABNORMAL LOW (ref 4.22–5.81)
RBC: 2.81 MIL/uL — ABNORMAL LOW (ref 4.22–5.81)
RDW: 19.7 % — ABNORMAL HIGH (ref 11.5–15.5)
RDW: 19.2 % — ABNORMAL HIGH (ref 11.5–15.5)
WBC: 19.8 10*3/uL — ABNORMAL HIGH (ref 4.0–10.5)
WBC: 24.3 10*3/uL — ABNORMAL HIGH (ref 4.0–10.5)
nRBC: 0 % (ref 0.0–0.2)
nRBC: 0 % (ref 0.0–0.2)

## 2022-09-06 LAB — RENAL FUNCTION PANEL
Albumin: 2 g/dL — ABNORMAL LOW (ref 3.5–5.0)
Anion gap: 18 — ABNORMAL HIGH (ref 5–15)
BUN: 81 mg/dL — ABNORMAL HIGH (ref 8–23)
CO2: 19 mmol/L — ABNORMAL LOW (ref 22–32)
Calcium: 8.5 mg/dL — ABNORMAL LOW (ref 8.9–10.3)
Chloride: 96 mmol/L — ABNORMAL LOW (ref 98–111)
Creatinine, Ser: 7.28 mg/dL — ABNORMAL HIGH (ref 0.61–1.24)
GFR, Estimated: 8 mL/min — ABNORMAL LOW (ref >60)
Glucose, Bld: 92 mg/dL (ref 70–99)
Phosphorus: 6.3 mg/dL — ABNORMAL HIGH (ref 2.5–4.6)
Potassium: 4.3 mmol/L (ref 3.5–5.1)
Sodium: 133 mmol/L — ABNORMAL LOW (ref 135–145)

## 2022-09-06 LAB — BPAM RBC
Blood Product Expiration Date: 202404132359
Blood Product Expiration Date: 202404182359
ISSUE DATE / TIME: 202403271411
ISSUE DATE / TIME: 202403301456
Unit Type and Rh: 5100
Unit Type and Rh: 5100

## 2022-09-06 MED ORDER — ROSUVASTATIN CALCIUM 5 MG PO TABS
10.0000 mg | ORAL_TABLET | Freq: Every day | ORAL | Status: DC
  Administered 2022-09-06 – 2022-09-23 (×15): 10 mg via ORAL
  Filled 2022-09-06 (×15): qty 2

## 2022-09-06 MED ORDER — HEPARIN SODIUM (PORCINE) 5000 UNIT/ML IJ SOLN
5000.0000 [IU] | Freq: Three times a day (TID) | INTRAMUSCULAR | Status: DC
  Administered 2022-09-06 – 2022-09-09 (×8): 5000 [IU] via SUBCUTANEOUS
  Filled 2022-09-06 (×8): qty 1

## 2022-09-06 MED ORDER — OXYCODONE HCL 5 MG PO TABS
5.0000 mg | ORAL_TABLET | ORAL | Status: DC | PRN
  Administered 2022-09-06 – 2022-09-20 (×32): 5 mg via ORAL
  Filled 2022-09-06 (×32): qty 1

## 2022-09-06 NOTE — Progress Notes (Signed)
Hgb came back stable this PM at 8.6. Ok to resume SQ heparin for DVT px per Dr. Loanne Drilling.  Onnie Boer, PharmD, BCIDP, AAHIVP, CPP Infectious Disease Pharmacist 09/06/2022 3:44 PM

## 2022-09-06 NOTE — H&P (View-Only) (Signed)
Nephrology Follow-Up Consult note   Assessment/Recommendations: Albert Harris is a/an 67 y.o. male with a past medical history significant for ESRD, PAD, CHF, HTN, admitted for critical limb ischemia.       ESRD: TTS and [place] via RUE AVG. was initially on CRRT given severe hemodynamic compromise.  Stopped CRRT on the morning of 3/26.  Received dialysis morning of 09/03/2022.  Plan for HD tomorrow after catheter placementr  Left common femoral artery pseudoaneurysm: Status post surgery with Dr. Unk Lightning on the morning of 08/29/2022 status post repair.  Overall much improved  CVA: stroke on MRI. Neuro following  Shock/MRSA Bacteremia: Likely multifactorial with infectious component. MRSA treatment per primary.  Shock resolved. TEE when able   Anemia: Likely multifactorial.  No IV iron given bacteremia. ESA ordered.  Dialysis access: AVG clotted attempted declot on 3/27 but unsuccessful. Temp cath removed 3/28. Plan for [facility] tomorrow with IR if permissible from infection stand point.   Recommendations conveyed to primary service.    [facility] 09/06/2022 2:39 PM  ___________________________________________________________  CC: Femoral artery pseudoaneurysm  Interval History/Subjective: Patient resting today with no complaints.   Medications:  Current Facility-Administered Medications  Medication Dose Route Frequency Provider Last Rate Last Admin   (feeding supplement) PROSource Plus liquid 30 mL  30 mL Oral BID BM Baglia, Corrina, PA-C   30 mL at 09/03/22 1648   0.9 %  sodium chloride infusion (Manually program via Guardrails IV Fluids)   Intravenous Once Baglia, Corrina, PA-C       0.9 %  sodium chloride infusion   Intra-arterial PRN Baglia, Corrina, PA-C       0.9 %  sodium chloride infusion  250 mL Intravenous Continuous Baglia, Corrina, PA-C   Stopped at 09/01/22 0400   acetaminophen (TYLENOL) tablet 650 mg  650 mg Oral Q6H PRN Baglia, Corrina,  PA-C   650 mg at 09/05/22 1052   Or   acetaminophen (TYLENOL) suppository 650 mg  650 mg Rectal Q4H PRN Baglia, Corrina, PA-C       alteplase (CATHFLO ACTIVASE) injection 2 mg  2 mg Intracatheter Once PRN Baglia, Corrina, PA-C       Chlorhexidine Gluconate Cloth 2 % PADS 6 each  6 each Topical Q0600 Baglia, Corrina, PA-C   6 each at 09/06/22 [id number]   Darbepoetin Alfa (ARANESP) injection 100 mcg  100 mcg Subcutaneous Q Wed-1800 Baglia, Corrina, PA-C   100 mcg at 09/02/22 1731   docusate sodium (COLACE) capsule 100 mg  100 mg Oral BID PRN Baglia, Corrina, PA-C       feeding supplement (NEPRO CARB STEADY) liquid 237 mL  237 mL Oral BID BM Baglia, Corrina, PA-C   237 mL at 09/03/22 1648   heparin injection 1,000 Units  1,000 Units Dialysis PRN Baglia, Corrina, PA-C       hydrALAZINE (APRESOLINE) injection 10 mg  10 mg Intravenous Q4H PRN Baglia, Corrina, PA-C   10 mg at 09/06/22 0749   HYDROmorphone (DILAUDID) injection 0.5 mg  0.5 mg Intravenous Q3H PRN Margaretha Seeds, MD   0.5 mg at 09/06/22 1323   multivitamin (RENA-VIT) tablet 1 tablet  1 tablet Oral QHS Baglia, Corrina, PA-C   1 tablet at 09/04/22 2131   ondansetron (ZOFRAN) injection 4 mg  4 mg Intravenous Q6H PRN Baglia, Corrina, PA-C       Oral care mouth rinse  15 mL Mouth Rinse PRN Baglia, Corrina, PA-C       oxyCODONE (Oxy  IR/ROXICODONE) immediate release tablet 5 mg  5 mg Oral Q4H PRN Margaretha Seeds, MD   5 mg at 09/06/22 0951   pantoprazole (PROTONIX) EC tablet 40 mg  40 mg Oral Daily Baglia, Corrina, PA-C   40 mg at 09/06/22 [id number]   pentafluoroprop-tetrafluoroeth (GEBAUERS) aerosol 1 Application  1 Application Topical PRN Baglia, Corrina, PA-C       polyethylene glycol (MIRALAX / GLYCOLAX) packet 17 g  17 g Oral Daily PRN Baglia, Corrina, PA-C       rosuvastatin (CRESTOR) tablet 10 mg  10 mg Oral Daily Rosalin Hawking, MD   10 mg at 09/06/22 1322   sodium chloride flush (NS) 0.9 % injection 10-40 mL  10-40 mL Intracatheter Q12H Baglia,  Corrina, PA-C   10 mL at 09/06/22 0952   sodium chloride flush (NS) 0.9 % injection 10-40 mL  10-40 mL Intracatheter PRN Baglia, Corrina, PA-C          Review of Systems: 10 systems reviewed and negative except per HPI  Physical Exam: Vitals:   09/06/22 1100 09/06/22 1200  BP: (!) 118/53 (!) 114/49  Pulse: 88 87  Resp: (!) 29 (!) 30  Temp:    SpO2: 93% 92%   Total I/O In: 210 [P.O.:200; I.V.:10] Out: -   Intake/Output Summary (Last 24 hours) at 09/06/2022 1439 Last data filed at 09/06/2022 1300 Gross per 24 hour  Intake 1718.74 ml  Output --  Net 1718.74 ml   Constitutional: lying in bed, nad ENMT: ears and nose without scars or lesions, MMM CV: normal rate, no edema Respiratory: bilateral chest rise, no iwob Gastrointestinal: soft, nonended Skin: no visible lesions or rashes Psych: mood and affect appropriate    Test Results I personally reviewed new and old clinical labs and radiology tests Lab Results  Component Value Date   NA 133 (L) 09/06/2022   K 4.3 09/06/2022   CL 96 (L) 09/06/2022   CO2 19 (L) 09/06/2022   BUN 81 (H) 09/06/2022   CREATININE 7.28 (H) 09/06/2022   GFR 58.30 (L) 01/19/2017   CALCIUM 8.5 (L) 09/06/2022   ALBUMIN 2.0 (L) 09/06/2022   PHOS 6.3 (H) 09/06/2022    CBC Recent Labs  Lab 09/05/22 1919 09/05/22 2248 09/06/22 0608  WBC 20.0* 20.3* 19.8*  HGB 8.5* 8.6* 9.2*  HCT 26.5* 25.4* 27.7*  MCV 95.7 91.4 92.3  PLT 192 193 189

## 2022-09-06 NOTE — Progress Notes (Signed)
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 Probable source was infected left femoral pseudoaneurysm s/p reapir of distal left EIA on 3/22, then 3/26 clotted right arm AVG - had complicated AV graft embolectomy on 3/27, lines removed on 3/28 for line holiday   Afebrile Received a unit of RBC yesterday  CT showed slightly larger anterior pelvic extraperitoneal hematoma Leukocytosis improving Blood cx NGTD since 3/27, 4 days Blood cx 1 of 4 bottles + MRSA on 3/25   A/P: needs venous access for hd, from infectious diseases standpoint, he continues on vancomycin for complicated MRSA bacteremia and can undergo new line placement since he has not had further + blood cx since 3/25.  Elzie Rings [facility] for Infectious Diseases [phone]

## 2022-09-06 NOTE — Anesthesia Preprocedure Evaluation (Addendum)
Anesthesia Evaluation  Patient identified by MRN, date of birth, ID band Patient awake    Reviewed: Allergy & Precautions, NPO status , Patient's Chart, lab work & pertinent test results  History of Anesthesia Complications Negative for: history of anesthetic complications  Airway Mallampati: I  TM Distance: >3 FB Neck ROM: Full    Dental  (+) Dental Advisory Given, Poor Dentition,    Pulmonary asthma , COPD, former smoker   Pulmonary exam normal        Cardiovascular hypertension, Pt. on home beta blockers and Pt. on medications pulmonary hypertension+ Peripheral Vascular Disease (Critical limb ischemia of right lower extremity) and +CHF  Normal cardiovascular exam  Echo 06/2022  1. Left ventricular ejection fraction, by estimation, is 35 to 40%. The left ventricle has moderately decreased function. The left ventricle demonstrates regional wall motion abnormalities (see scoring diagram/findings for description). There is moderate concentric left ventricular hypertrophy. Left ventricular diastolic parameters are consistent with Grade I diastolic dysfunction (impaired relaxation). Elevated left ventricular end-diastolic pressure.   2. Right ventricular systolic function is normal. The right ventricular size is normal. There is severely elevated pulmonary artery systolic pressure.   3. Left atrial size was severely dilated.   4. Right atrial size was severely dilated.   5. The mitral valve is myxomatous. Mild mitral valve regurgitation. No evidence of mitral stenosis.   6. The aortic valve is normal in structure. There is mild calcification of the aortic valve. There is mild thickening of the aortic valve. Aortic valve regurgitation is not visualized. Aortic valve sclerosis/calcification is present, without any evidence of aortic stenosis. Aortic valve area, by VTI measures 2.74 cm. Aortic valve mean gradient measures 9.0 mmHg. Aortic valve  Vmax measures 2.04 m/s.   7. The inferior vena cava is normal in size with <50% respiratory variability, suggesting right atrial pressure of 8 mmHg.   IMPRESSIONS     1. Left ventricular ejection fraction, by estimation, is 45%. There is  moderate to severe concentric left ventricular hypertrophy. Diastolic  function was not assessed.   2. Right ventricular systolic function is normal.   3. The mitral valve is grossly normal with mild thickening of the mitral  valve leaflets. Mild mitral valve regurgitation.   4. Right atrial size was mildly dilated.   5. Left atrial size was mildly dilated.   6. The inferior vena cava is normal in size with greater than 50%  respiratory variability, suggesting right atrial pressure of 3 mmHg.   7. The aortic valve was not well visualized. There is mild calcification  of the aortic valve. There is mild thickening of the aortic valve. Aortic  valve regurgitation is not visualized.      Neuro/Psych TIACVA    GI/Hepatic ,GERD  ,,(+)     substance abuse  alcohol use  Endo/Other  negative endocrine ROS    Renal/GU ESRF and DialysisRenal disease     Musculoskeletal negative musculoskeletal ROS (+)    Abdominal   Peds  Hematology  (+) Blood dyscrasia (Plavix), anemia   Anesthesia Other Findings   Reproductive/Obstetrics                             Anesthesia Physical Anesthesia Plan  ASA: 4  Anesthesia Plan: MAC   Post-op Pain Management: Minimal or no pain anticipated and Tylenol PO (pre-op)*   Induction:   PONV Risk Score and Plan: 2 and Treatment may vary due to  age or medical condition, Ondansetron and Dexamethasone  Airway Management Planned: Natural Airway  Additional Equipment:   Intra-op Plan:   Post-operative Plan:   Informed Consent: I have reviewed the patients History and Physical, chart, labs and discussed the procedure including the risks, benefits and alternatives for the proposed  anesthesia with the patient or authorized representative who has indicated his/her understanding and acceptance.     Dental advisory given  Plan Discussed with: Anesthesiologist and CRNA  Anesthesia Plan Comments:        Anesthesia Quick Evaluation

## 2022-09-06 NOTE — Consult Note (Signed)
Chief Complaint: Patient was seen in consultation today for end stage renal disease.  Referring Physician(s): Santiago Bumpers, MD  Supervising Physician: Ruthann Cancer  Patient Status: [facility] - In-pt  History of Present Illness: [facility] is a 67 y.o. male with a past medical history significant for ETOH abuse, COPD, anemia, CHF, HTN, HLD, PAD s/p left iliac stenting for acute on chronic limb ischemia 08/22/22, DVT s/p LLE thrombectomy 08/22/22, [name] and ESRD on HD via RUE AVG who presented to [facility] ED on 3/22 with complaints of abdominal pain and hypotension. On initial evaluation he was found to have severe lactic acidosis, hyperkalemia as well as a cold and pulseless LLE. CTA showed left femoral pseudoaneurysm. He reported missing dialysis for the last week due to pain so he underwent temp cath placement and urgent CRRT in the ED followed by left groin exploration with hematoma evacuation and primary repair of the distal external iliac artery on 3/23 with vascular surgery.  His RUE AVG was attempted to be used later that week and was noted to be clotted on 3/26. He subsequently underwent AVG embolectomy 3/27 with vascular surgery. 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 ID was consulted who felt that probable source was infected left femoral pseudoaneurysm. He continues on vancomycin per ID and is appropriate for new line placement since he has not had further positive blood cultures since 3/25. IR has been consulted for tunneled HD catheter placement.  Patient seen in ICU, he reports hip pain from being in bed and is asking for something to drink. Discussed temp vs tunneled HD catheter placement tomorrow in IR which he is agreeable to.  Past Medical History:  Diagnosis Date   Acute metabolic encephalopathy [id number]   Acute on chronic diastolic CHF (congestive heart failure) ([place]) 12/15/2020   Acute respiratory disease  due to COVID-19 virus 06/16/2020   Alcohol use disorder 06/30/2022   Anemia    Asthma    Bacteremia due to Gram-positive bacteria 05/09/2021   Carotid stenosis 09/12/2019   CHF (congestive heart failure) (HCC)    Claudication in peripheral vascular disease ([place]) [id number]   Complication of anesthesia    " i HAVE A HARD TIME WAKING UP " (only one time)   COPD (chronic obstructive pulmonary disease) ([place])    COVID-19    positive on  06/16/20   Critical limb ischemia of right lower extremity (HCC)    ESRD on hemodialysis ([place])    t, th. sat dialysis   GERD (gastroesophageal reflux disease)    HLD (hyperlipidemia)    Hypertension    Hypertensive urgency 12/24/2016   Lower GI bleed    Nausea vomiting and diarrhea 08/13/2022   Peripheral vascular disease (HCC)    PFO with atrial septal aneurysm    by echo 11/2017   Pneumonia    Stroke [facility])    Surgical wound infection 09/20/2021   [name] (transient ischemic attack) 11/2017   Tobacco abuse    quit 07/17/19   Volume overload 12/14/2020    Past Surgical History:  Procedure Laterality Date   ABDOMINAL AORTOGRAM W/LOWER EXTREMITY N/A 06/21/2020   Procedure: ABDOMINAL AORTOGRAM W/LOWER EXTREMITY;  Surgeon: Angelia Mould, MD;  Location: [place] [facility];  Service: Cardiovascular;  Laterality: N/A;   ABDOMINAL AORTOGRAM W/LOWER EXTREMITY Right 08/29/2021   Procedure: ABDOMINAL AORTOGRAM W/LOWER EXTREMITY;  Surgeon: Cherre Robins, MD;  Location: [place] [facility];  Service:  Cardiovascular;  Laterality: Right;   ABDOMINAL AORTOGRAM W/LOWER EXTREMITY N/A 08/10/2022   Procedure: ABDOMINAL AORTOGRAM W/LOWER EXTREMITY;  Surgeon: Angelia Mould, MD;  Location: [place] [facility];  Service: Cardiovascular;  Laterality: N/A;   AORTOGRAM N/A 08/22/2022   Procedure: AORTOGRAM;  Surgeon: Waynetta Sandy, MD;  Location: [place] [employer];  Service: Vascular;  Laterality: N/A;   APPLICATION OF WOUND VAC Right 09/21/2021   Procedure:  APPLICATION OF WOUND VAC;  Surgeon: Angelia Mould, MD;  Location: [place];  Service: Vascular;  Laterality: Right;   APPLICATION OF WOUND VAC Left 08/28/2022   Procedure: APPLICATION OF [place] WITH PLACEMENT OF [place];  Surgeon: Broadus John, MD;  Location: [place];  Service: Vascular;  Laterality: Left;   ARTERY REPAIR Left 08/28/2022   Procedure: LEFT EXTERNAL ILIAC ARTERY REPAIR;  Surgeon: Broadus John, MD;  Location: [place];  Service: Vascular;  Laterality: Left;   AV FISTULA PLACEMENT Left 03/28/2020   Procedure: ARTERIOVENOUS (AV) FISTULA CREATION LEFT;  Surgeon: [name], MD;  Location: [place];  Service: Vascular;  Laterality: Left;   AV FISTULA PLACEMENT Right 09/23/2020   Procedure: RIGHT Arm ARTERIOVENOUS GRAFT CREATION.;  Surgeon: Angelia Mould, MD;  Location: [place];  Service: Vascular;  Laterality: Right;   COLONOSCOPY     COLONOSCOPY WITH PROPOFOL N/A 06/27/2020   Procedure: COLONOSCOPY WITH PROPOFOL;  Surgeon: Carol Ada, MD;  Location: [place];  Service: Endoscopy;  Laterality: N/A;   DRAINAGE AND CLOSURE OF LYMPHOCELE Right 09/21/2021   Procedure: EVACUATION OF LYMPHOCELE RIGHT GROIN;  Surgeon: Angelia Mould, MD;  Location: [place];  Service: Vascular;  Laterality: Right;   ENDARTERECTOMY FEMORAL Right 09/05/2021   Procedure: ENDARTERECTOMY FEMORAL;  Surgeon: Angelia Mould, MD;  Location: [place];  Service: Vascular;  Laterality: Right;   FEMORAL-POPLITEAL BYPASS GRAFT Right 09/05/2021   Procedure: RIGHT FEMORAL-POPLITEAL ARTERY BYPASS WITH Spaphenous Vein;  Surgeon: Angelia Mould, MD;  Location: [place];  Service: Vascular;  Laterality: Right;   [place]  06/27/2020   Procedure: HEMOSTASIS CLIP PLACEMENT;  Surgeon: Carol Ada, MD;  Location: [place];  Service: Endoscopy;;   INSERTION OF DIALYSIS CATHETER Right 06/24/2020   Procedure: INSERTION OF TUNNELED   DIALYSIS CATHETER; Removal of temporary dialysis catheter right neck;  Surgeon: Angelia Mould, MD;  Location: [place];  Service: Vascular;  Laterality: Right;   INSERTION OF ILIAC STENT Left 08/22/2022   Procedure: INSERTION OF ILIAC STENT;  Surgeon: Waynetta Sandy, MD;  Location: [place];  Service: Vascular;  Laterality: Left;   IR PARACENTESIS  06/30/2022   LIGATION OF ARTERIOVENOUS  FISTULA Left 07/24/2020   Procedure: LIGATION OF LEFT BRACHIOCEPHALIC ARTERIOVENOUS  FISTULA;  Surgeon: [name], MD;  Location: [place];  Service: Vascular;  Laterality: Left;   PERIPHERAL VASCULAR INTERVENTION Right 06/21/2020   Procedure: PERIPHERAL VASCULAR INTERVENTION;  Surgeon: Angelia Mould, MD;  Location: [place] [facility];  Service: Cardiovascular;  Laterality: Right;   PERIPHERAL VASCULAR INTERVENTION Left 08/10/2022   Procedure: PERIPHERAL VASCULAR INTERVENTION;  Surgeon: Angelia Mould, MD;  Location: [place] [facility];  Service: Cardiovascular;  Laterality: Left;  External Illiac   REVISION OF ARTERIOVENOUS GORETEX GRAFT Right 09/02/2022   Procedure: REVISION OF RIGHT ARTERIOVENOUS GORETEX GRAFT;  Surgeon: Broadus John, MD;  Location: [place];  Service: Vascular;  Laterality: Right;   TEE WITHOUT CARDIOVERSION N/A 05/13/2021  Procedure: TRANSESOPHAGEAL ECHOCARDIOGRAM (TEE);  Surgeon: Donato Heinz, MD;  Location: [facility];  Service: Cardiovascular;  Laterality: N/A;   THROMBECTOMY FEMORAL ARTERY Left 08/22/2022   Procedure: AORTOILIAC THROMBECTOMY, COMMON FEMORAL ENDARTERECTOMY,;  Surgeon: Waynetta Sandy, MD;  Location: [place];  Service: Vascular;  Laterality: Left;   VEIN HARVEST Right 09/05/2021   Procedure: RIGHT Saphenous VEIN HARVEST;  Surgeon: Angelia Mould, MD;  Location: [place];  Service: Vascular;  Laterality: Right;    Allergies: Benadryl [diphenhydramine] and Lidocaine-prilocaine  Medications: Prior to Admission  medications   Medication Sig Start Date End Date Taking? Authorizing Provider  albuterol (VENTOLIN HFA) 108 (90 Base) MCG/ACT inhaler INHALE 1-2 PUFFS INTO THE LUNGS EVERY 6 (SIX) HOURS AS NEEDED FOR WHEEZING OR SHORTNESS OF BREATH. 07/02/20 08/22/23  Maness, Arnette Norris, MD  aspirin EC 81 MG tablet Take 1 tablet (81 mg total) by mouth daily. Swallow whole. Patient not taking: Reported on 08/22/2022 08/10/22 08/10/23  Angelia Mould, MD  B Complex-C-Zn-Folic Acid (DIALYVITE/ZINC) TABS Take 1 tablet by mouth daily. Patient not taking: Reported on 08/22/2022 04/25/22   [provider]  calcium acetate (PHOSLO) 667 MG capsule Take 2 capsule by mouth three times a day with meals Patient taking differently: Take 1,334 mg by mouth 3 (three) times daily with meals. 06/17/21     clopidogrel (PLAVIX) 75 MG tablet Take 1 tablet (75 mg total) by mouth daily. 08/24/22 09/23/22  Alen Bleacher, MD  hydrOXYzine (ATARAX) 10 MG tablet Take 10 mg by mouth 3 (three) times daily. 08/18/22   [provider]  metoprolol succinate (TOPROL-XL) 25 MG 24 hr tablet Take 1 tablet (25 mg total) by mouth daily. 07/01/22   Precious Gilding, DO  rosuvastatin (CRESTOR) 10 MG tablet Take 1 tablet (10 mg total) by mouth daily. Patient not taking: Reported on 08/13/2022 08/10/22 08/10/23  Angelia Mould, MD     Family History  Problem Relation Age of Onset   Hypertension Mother    Colon cancer Father    Stomach cancer Brother     Social History   Socioeconomic History   Marital status: Divorced    Spouse name: Not on file   Number of children: Not on file   Years of education: Not on file   Highest education level: Not on file  Occupational History   Not on file  Tobacco Use   Smoking status: Former    Packs/day: 1.00    Years: 50.00    Additional pack years: 0.00    Total pack years: 50.00    Types: Cigarettes    Quit date: 07/17/2019    Years since quitting: 3.1   Smokeless tobacco: Never  Vaping Use    Vaping Use: Never used  Substance and Sexual Activity   Alcohol use: Not Currently    Alcohol/week: 1.0 - 3.0 standard drink of alcohol    Types: 1 - 3 Cans of beer per week   Drug use: No   Sexual activity: Not Currently  Other Topics Concern   Not on file  Social History Narrative   Married   Works in Audiological scientist   Social Determinants of Health   Financial Resource Strain: [place]  (09/12/2019)   Overall Financial Resource Strain (CARDIA)    Difficulty of Paying Living Expenses: Not hard at all  Food Insecurity: Food Insecurity Present (08/22/2022)   Hunger Vital Sign    Worried About Running Out of Food in the Last Year: Sometimes  true    Ran Out of Food in the Last Year: Sometimes true  Transportation Needs: Unmet Transportation Needs (08/22/2022)   PRAPARE - Hydrologist (Medical): Yes    Lack of Transportation (Non-Medical): No  Physical Activity: Not on file  Stress: No Stress Concern Present (09/12/2019)   [place]    Feeling of Stress : Not at all  Social Connections: Not on file     Review of Systems: A 12 point ROS discussed and pertinent positives are indicated in the HPI above.  All other systems are negative.  Review of Systems  Constitutional:  Positive for fatigue. Negative for fever.  Respiratory:  Negative for cough and shortness of breath.   Cardiovascular:  Negative for chest pain.  Gastrointestinal:  Negative for abdominal pain.  Musculoskeletal:  Positive for arthralgias and back pain.  Neurological:  Negative for headaches.    Vital Signs: BP (!) 114/49   Pulse 87   Temp 99.1 F (37.3 C) (Oral)   Resp (!) 30   Ht 5\' 8"  (1.727 m)   Wt 132 lb 11.5 oz (60.2 kg)   SpO2 92%   BMI 20.18 kg/m   Physical Exam Vitals and nursing note reviewed.  Constitutional:      General: He is not in acute distress.    Appearance: He is  ill-appearing.  HENT:     Head: Normocephalic.     Mouth/Throat:     Mouth: Mucous membranes are dry.     Pharynx: Oropharynx is clear. No oropharyngeal exudate or posterior oropharyngeal erythema.  Cardiovascular:     Rate and Rhythm: Normal rate and regular rhythm.     Comments: (+) RUE AVG without thrill or bruit Pulmonary:     Effort: Pulmonary effort is normal.     Breath sounds: Normal breath sounds.  Abdominal:     Palpations: Abdomen is soft.  Skin:    General: Skin is warm and dry.  Neurological:     Mental Status: He is alert and oriented to person, place, and time.  Psychiatric:        Mood and Affect: Mood normal.        Behavior: Behavior normal.        Thought Content: Thought content normal.        Judgment: Judgment normal.      MD Evaluation Airway: WNL Heart: WNL Abdomen: WNL Chest/ Lungs: WNL ASA  Classification: 3 Mallampati/Airway Score: Two   Imaging: VAS [place] LOWER EXTREMITY ARTERIAL DUPLEX  Result Date: 09/05/2022 LOWER EXTREMITY ARTERIAL DUPLEX STUDY Patient Name:  [facility]  Date of Exam:   09/05/2022 Medical Rec #: [id number]       Accession #:    [id number] Date of Birth: [birth year]      Patient Gender: M Patient Age:   28 years Exam Location:  [facility] Procedure:      VAS [place] LOWER EXTREMITY ARTERIAL DUPLEX Referring Phys: Cornelius Moras XU --------------------------------------------------------------------------------  Indications: Peripheral artery disease, and Acute pain in lower back and left              leg. Anterior pelvic extraperitoneal hematoma within the prevesical              space. Right > left extension of hemorrhage within the              extraperitoneal lateral pelvic walls by CT today. High Risk Factors:  Hypertension, hyperlipidemia, prior CVA. Other Factors: ESRD on dialysis,.  Vascular Interventions: Status post surgical repair of pseudoaneurysm 08/28/22,                         right fem-pop bypass with GSV 09/05/21, left  iliac                         stenting. Current ABI:            n/a Comparison Study: Prior duplex of right fem-pop bypass done 08/06/22 indicating                   patent bypass Performing Technologist: Sharion Dove RVS  Examination Guidelines: A complete evaluation includes B-mode imaging, spectral Doppler, color Doppler, and power Doppler as needed of all accessible portions of each vessel. Bilateral testing is considered an integral part of a complete examination. Limited examinations for reoccurring indications may be performed as noted.  +----------+--------+-----+---------------+--------+--------+ RIGHT     PSV cm/sRatioStenosis       WaveformComments +----------+--------+-----+---------------+--------+--------+ ATA Mid   169          30-49% stenosis                 +----------+--------+-----+---------------+--------+--------+ ATA Distal147                                          +----------+--------+-----+---------------+--------+--------+ PTA Prox  114                                          +----------+--------+-----+---------------+--------+--------+ PTA Mid   116                                          +----------+--------+-----+---------------+--------+--------+ PTA Distal154          30-49% stenosis                 +----------+--------+-----+---------------+--------+--------+ DP        0                                            +----------+--------+-----+---------------+--------+--------+  Right Graft #1: Fem-Pop +------------------+--------+--------+----------+-----------+                   PSV cm/sStenosisWaveform  Comments    +------------------+--------+--------+----------+-----------+ Inflow            130             monophasic            +------------------+--------+--------+----------+-----------+ Prox Anastomosis  110                       multiphasic +------------------+--------+--------+----------+-----------+  Proximal Graft    99                                    +------------------+--------+--------+----------+-----------+ Mid Graft         88                                    +------------------+--------+--------+----------+-----------+  Distal Graft      86                                    +------------------+--------+--------+----------+-----------+ Distal Anastomosis116                                   +------------------+--------+--------+----------+-----------+ Outflow                                                 +------------------+--------+--------+----------+-----------+   +-----------+--------+-----+--------+-------------------+-----------------+ LEFT       PSV cm/sRatioStenosisWaveform           Comments          +-----------+--------+-----+--------+-------------------+-----------------+ CFA Prox   88                   monophasic                           +-----------+--------+-----+--------+-------------------+-----------------+ DFA        82                   monophasic                           +-----------+--------+-----+--------+-------------------+-----------------+ SFA Prox                occluded                                     +-----------+--------+-----+--------+-------------------+-----------------+ SFA Mid                 occluded                                     +-----------+--------+-----+--------+-------------------+-----------------+ SFA Distal                      monophasic         collateral flow   +-----------+--------+-----+--------+-------------------+-----------------+ POP Prox   34                   monophasic                           +-----------+--------+-----+--------+-------------------+-----------------+ POP Distal 33                   monophasic                           +-----------+--------+-----+--------+-------------------+-----------------+ ATA Prox   23                    dampened monophasic                  +-----------+--------+-----+--------+-------------------+-----------------+ ATA Mid    26                   dampened monophasiccollaterals noted +-----------+--------+-----+--------+-------------------+-----------------+ ATA Distal  dampened monophasic                  +-----------+--------+-----+--------+-------------------+-----------------+ PTA Prox   26                   dampened monophasic                  +-----------+--------+-----+--------+-------------------+-----------------+ PTA Mid    26                   dampened monophasic                  +-----------+--------+-----+--------+-------------------+-----------------+ PTA Distal 28                   dampened monophasic                  +-----------+--------+-----+--------+-------------------+-----------------+ PERO Prox  20                   dampened monophasic                  +-----------+--------+-----+--------+-------------------+-----------------+ PERO Mid   34                   dampened monophasic                  +-----------+--------+-----+--------+-------------------+-----------------+ PERO Distal29                   dampened monophasiccollateral flow   +-----------+--------+-----+--------+-------------------+-----------------+ DP                              not visualized                       +-----------+--------+-----+--------+-------------------+-----------------+  Summary: Right: No significant change compared to previous study. Patent fem-pop bypass graft. 30-49% stenosis noted in the mid ATA and distal PTA. Left: Chronically occluded SFA. Monophasic and dampened monophasic flow noted in the popliteal, posterior tibial, anterior tibial, and peroneal arteries. Collateral flow noted in the distal SFA, mid ATA, and distal peroneal.  See table(s) above for measurements and observations. Electronically signed by Harold Barban MD on 09/05/2022 at 9:11:41 PM.    Final    VAS US CAROTID  Result Date: 09/05/2022 Carotid Arterial Duplex Study Patient Name:  [facility]  Date of Exam:   09/05/2022 Medical Rec #: [id number]       Accession #:    [id number] Date of Birth: 1955-08-10      Patient Gender: M Patient Age:   64 years Exam Location:  [facility] Procedure:      VAS US CAROTID Referring Phys: Alferd Patee [facility] --------------------------------------------------------------------------------  Indications:       CVA. Risk Factors:      Hypertension, hyperlipidemia, past history of smoking, PAD. Other Factors:     ESRD on dialysis. Limitations        Today's exam was limited due to Patient condition.                    Somnolence. and heavy calcification and the resulting                    shadowing. Comparison Study:  Prior carotid duplex done 10/02/19 Performing Technologist: Sharion Dove RVS  Examination Guidelines: A complete evaluation includes B-mode imaging, spectral Doppler, color Doppler, and power Doppler as needed  of all accessible portions of each vessel. Bilateral testing is considered an integral part of a complete examination. Limited examinations for reoccurring indications may be performed as noted.  Right Carotid Findings: +----------+--------+--------+--------+----------------------+--------+           PSV cm/sEDV cm/sStenosisPlaque Description    Comments +----------+--------+--------+--------+----------------------+--------+ CCA Prox  112     17              heterogenous                   +----------+--------+--------+--------+----------------------+--------+ CCA Distal122     14              heterogenous                   +----------+--------+--------+--------+----------------------+--------+ ICA Prox  215     29      40-59%  calcific and irregulartortuous +----------+--------+--------+--------+----------------------+--------+ ICA Mid   126     22               calcific                       +----------+--------+--------+--------+----------------------+--------+ ICA Distal154     26                                             +----------+--------+--------+--------+----------------------+--------+ ECA       47      3                                              +----------+--------+--------+--------+----------------------+--------+ +----------+--------+-------+------------+-------------------+           PSV cm/sEDV cmsDescribe    Arm Pressure (mmHG) +----------+--------+-------+------------+-------------------+ Subclavian               Not assessed                    +----------+--------+-------+------------+-------------------+ +---------+--------+--------+------------+ VertebralPSV cm/sEDV cm/sNot assessed +---------+--------+--------+------------+  Left Carotid Findings: +----------+--------+--------+--------+----------------------+--------+           PSV cm/sEDV cm/sStenosisPlaque Description    Comments +----------+--------+--------+--------+----------------------+--------+ CCA Prox  99      13              calcific                       +----------+--------+--------+--------+----------------------+--------+ CCA Mid   117     13              calcific                       +----------+--------+--------+--------+----------------------+--------+ CCA Distal111     12              calcific                       +----------+--------+--------+--------+----------------------+--------+ ICA Prox  199     24      40-59%  calcific and irregular         +----------+--------+--------+--------+----------------------+--------+ ICA Mid   194     28                                             +----------+--------+--------+--------+----------------------+--------+  ICA Distal69      11                                             +----------+--------+--------+--------+----------------------+--------+ ECA        271     16              calcific                       +----------+--------+--------+--------+----------------------+--------+ +----------+--------+--------+--------+-------------------+           PSV cm/sEDV cm/sDescribeArm Pressure (mmHG) +----------+--------+--------+--------+-------------------+ [id number]                                         +----------+--------+--------+--------+-------------------+ +---------+--------+--+--------+--+---------+ VertebralPSV cm/s83EDV cm/s16Antegrade +---------+--------+--+--------+--+---------+   Summary: Right Carotid: Velocities in the right ICA are consistent with a 40-59%                stenosis, no significant change since prior study. Left Carotid: Velocities in the left ICA are consistent with a 40-59% stenosis,               no significant change since prior study. Vertebrals:  Left vertebral artery demonstrates antegrade flow. Subclavians: Normal flow hemodynamics were seen in the left subclavian artery. *See table(s) above for measurements and observations.     Preliminary    CT ABDOMEN PELVIS WO CONTRAST  Result Date: 09/05/2022 CLINICAL DATA:  Rule out retroperitoneal bleed. EXAM: CT ABDOMEN AND PELVIS WITHOUT CONTRAST TECHNIQUE: Multidetector CT imaging of the abdomen and pelvis was performed following the standard protocol without IV contrast. RADIATION DOSE REDUCTION: This exam was performed according to the departmental dose-optimization program which includes automated exposure control, adjustment of the mA and/or kV according to patient size and/or use of iterative reconstruction technique. COMPARISON:  08/12/2022 FINDINGS: Lower chest: Emphysema. Development of areas of nodular airspace disease within Harris lower lobes since 08/12/2022. Mild cardiomegaly. Distal right coronary artery calcification. Hypoattenuation in the intravascular space suggests anemia. Trace left pleural fluid is similar. Hepatobiliary: Normal liver.  Normal gallbladder, without biliary ductal dilatation. Pancreas: Soft tissue density within the pancreatic tail of 2.1 cm in 23/3 is most consistent with a splenule, when correlated with contrast enhanced exam. No duct dilatation or acute inflammation. Spleen: Otherwise normal spleen. Adrenals/Urinary Tract: Normal adrenal glands. Marked renal cortical thinning bilaterally. Renal vascular calcifications. No hydronephrosis. The bladder is decompressed, without calcified stone. Stomach/Bowel: Normal stomach, without wall thickening. Scattered colonic diverticula. Normal terminal ileum and appendix. Normal small bowel. Vascular/Lymphatic: Advanced aortic and branch vessel atherosclerosis. Bilateral iliac stent grafts. No abdominopelvic adenopathy. Reproductive: Normal prostate. Other: No free intraperitoneal air. No intraperitoneal fluid identified. Hemorrhage within the anterior extraperitoneal pelvic prevesical space at maximally 10.3 x 6.3 cm on 35/3. This is contiguous with right pelvic extraperitoneal hematoma at 4.7 x 3.3 cm on 71/3. Increase in left pelvic complex fluid which is also presumably hemorrhage on 68/3. There is adjacent gas as well as gas and fluid in the left groin, new on 75/3-likely iatrogenic. Musculoskeletal: Posterolateral right ninth rib nonacute but incompletely healed fracture. Transitional S1 vertebral body. IMPRESSION: 1. Anterior pelvic extraperitoneal hematoma within the prevesical space. Right-greater-than-left extension of hemorrhage within the extraperitoneal lateral pelvic walls. 2. Trace left pleural fluid. 3. Development  of nodular airspace disease since 08/12/2022, favoring infection or aspiration. 4. Coronary artery atherosclerosis. Aortic Atherosclerosis (ICD10-I70.0). Emphysema (ICD10-J43.9). These results will be called to the ordering clinician or representative by the Radiologist Assistant, and communication documented in the PACS or [employer]. Electronically Signed    By: Abigail Miyamoto M.D.   On: 09/05/2022 13:25   MR CERVICAL SPINE WO CONTRAST  Result Date: 09/05/2022 CLINICAL DATA:  Myelopathy, acute, cervical spine. EXAM: MRI CERVICAL SPINE WITHOUT CONTRAST TECHNIQUE: Multiplanar, multisequence MR imaging of the cervical spine was performed. No intravenous contrast was administered. COMPARISON:  Radiographs 05/11/2022, CT 08/07/2021 and MRI of the cervical spine 06/25/2022. FINDINGS: Despite efforts by the technologist and patient, mild to moderate motion artifact is present on today's exam and could not be eliminated. This reduces exam sensitivity and specificity. Alignment: Straightening without focal angulation or listhesis. Vertebrae: Interval improvement in the previously demonstrated abnormalities of the C5 and C6 vertebral bodies and intervening C5-6 disc. There is less marrow edema and less surrounding inflammatory change. No new disc space findings or acute osseous abnormalities. Cord: Stable cord flattening and possible T2 hyperintensity in the cord at the C3-4 and C5-6 levels. No evidence of cord hemorrhage. Posterior Fossa, vertebral arteries, paraspinal tissues: Intracranial findings are dictated separatelybilateral vertebral artery flow voids. The prevertebral inflammatory changes seen previously have significantly improved in the interval. No paraspinal fluid collection identified. Oval T2 hyperintensity within the left mandible was not previously imaged and could reflect osteomyelitis. Disc levels: C2-3: No significant findings. C3-4: Stable spondylosis with loss of disc height and posterior osteophytes covering diffusely bulging disc material. There is uncinate spurring and facet hypertrophy bilaterally contributing to stable mild cord flattening and foraminal narrowing bilaterally. There is possible mild cord T2 hyperintensity, grossly stable. C4-5: Mild spondylosis with mild disc bulging and facet hypertrophy. No cord deformity or high-grade foraminal  narrowing. C5-6: Chronic spondylosis with loss of disc height and posterior osteophytes covering diffusely bulging disc material. The AP diameter of the canal is narrowed to approximately 5 mm with unchanged chronic cord compression. Moderate foraminal narrowing bilaterally. As above, the previously demonstrated paraspinal inflammatory changes and marrow edema at this level have improved. C6-7: Stable spondylosis with posterior osteophytes covering diffusely bulging disc material. Mild spinal stenosis and mild foraminal narrowing bilaterally. C7-T1: Stable mild disc bulging and mild uncinate spurring bilaterally. No cord deformity. Mild foraminal narrowing bilaterally. T1-2: Normal interspace. IMPRESSION: 1. Compared with previous MRI from 06/25/2022, there has been significant improvement in the previously demonstrated paraspinal inflammatory changes and marrow edema at the C5-6 level, suggesting improving discitis/osteomyelitis. Correlate clinically. No residual paraspinal fluid collection identified. 2. Similar multilevel cervical spondylosis, most advanced at C5-6 where there is chronic cord compression and moderate foraminal narrowing bilaterally. Possible mild cord T2 hyperintensity at C3-4 and C5-6. 3. T2 hyperintensity in the left body of the mandible, not previously imaged and potentially reflecting osteomyelitis. Correlate clinically. 4. No acute findings identified. Electronically Signed   By: Richardean Sale M.D.   On: 09/05/2022 12:37   MR ANGIO HEAD WO CONTRAST  Result Date: 09/05/2022 CLINICAL DATA:  Acute cervical myelopathy EXAM: MRA HEAD WITHOUT CONTRAST TECHNIQUE: Angiographic images of the Circle of Willis were acquired using MRA technique without intravenous contrast. COMPARISON:  Brain MRI from yesterday FINDINGS: Pervasive motion artifact. The left vertebral artery is strongly dominant with thready flow in the right vertebral artery accentuated by motion. No branch occlusion, beading, or  aneurysm. No flow limiting stenosis. IMPRESSION: Motion degraded MRA without  acute finding. Irregular appearance of the right vertebral artery partially related to motion. Electronically Signed   By: Jorje Guild M.D.   On: 09/05/2022 11:54   MR LUMBAR SPINE WO CONTRAST  Result Date: 09/04/2022 CLINICAL DATA:  LLE EXAM: MRI LUMBAR SPINE WITHOUT CONTRAST TECHNIQUE: Multiplanar, multisequence MR imaging of the lumbar spine was performed. No intravenous contrast was administered. COMPARISON:  None Available. FINDINGS: Limited due to motion/artifact. Segmentation: Standard segmentation is assumed. The inferior-most fully formed intervertebral disc is labeled L5-S1. Alignment:  No substantial sagittal subluxation. Vertebrae: Heterogeneous bone marrow. No focal edema to suggest acute fracture or discitis/osteomyelitis. No focal suspicious bone lesion. Conus medullaris and cauda equina: Conus extends to the superior L2 level. Conus appears normal. Paraspinal and other soft tissues: Limited visualization without obvious acute abnormality. Disc levels: T12-L1: No significant disc protrusion, foraminal stenosis, or canal stenosis. L1-L2: No significant disc protrusion, foraminal stenosis, or canal stenosis. L2-L3: No significant disc protrusion, foraminal stenosis, or canal stenosis. L3-L4: Mild disc bulging and facet arthropathy. No significant stenosis. L4-L5: No significant disc protrusion, foraminal stenosis, or canal stenosis. L5-S1: Facet arthropathy. Small left paracentral disc protrusion which comes in close proximity to the exiting left L5 nerve without impingement. No significant stenosis. IMPRESSION: 1. Motion limited study without evidence of significant canal or foraminal stenosis. 2. At L5-S1, small left paracentral disc protrusion which comes in close proximity to the exiting left L5 nerve without impingement. Electronically Signed   By: Margaretha Sheffield M.D.   On: 09/04/2022 12:19   MR BRAIN WO  CONTRAST  Result Date: 09/04/2022 CLINICAL DATA:  Provided history: Altered mental status, left lower extremity weakness. EXAM: MRI HEAD WITHOUT CONTRAST TECHNIQUE: Multiplanar, multiecho pulse sequences of the brain and surrounding structures were obtained without intravenous contrast. COMPARISON:  Head CT 08/30/2022. FINDINGS: Intermittently motion degraded examination, limiting evaluation. Most notably, the sagittal T1 sequence is moderately motion degraded and the least motion degraded axial T2 sequence is moderately motion degraded. Within this limitation, findings are as follows. Brain: Moderate generalized cerebral atrophy. Mild cerebellar atrophy. 4 mm acute infarct within the right subinsular white matter (series 2, images 22 and 23) (series 3, image 19). Redemonstrated chronic lacunar infarct within the right basal ganglia. Background multifocal T2 FLAIR hyperintense signal abnormality within the cerebral white matter, nonspecific but compatible with mild chronic small vessel ischemic disease. These findings are similar to the prior brain MRI of 05/09/2021. As before, there are a few chronic microhemorrhages scattered within the supratentorial brain. Redemonstrated tiny chronic lacunar infarct within the central pons, and small chronic infarcts within the left cerebellar hemisphere. No evidence of an intracranial mass. No extra-axial fluid collection. No midline shift. Vascular: As before, there is signal abnormality within the intracranial right vertebral artery suggesting high-grade stenosis or vessel occlusion. Skull and upper cervical spine: No focal suspicious marrow lesion. Incompletely assessed cervical spondylosis. Sinuses/Orbits: No mass or acute finding within the imaged orbits. No more than trace paranasal sinus mucosal thickening. IMpression #2 will be called to the ordering clinician or representative by the Radiologist Assistant, and communication documented in the PACS or [employer].  IMPRESSION: 1. Motion degraded examination. 2. 4 mm acute infarct within the right subinsular white matter. 3. Parenchymal atrophy, chronic small vessel ischemic disease and chronic infarcts as described. 4. Few chronic microhemorrhages within the supratentorial brain. 5. Chronic signal abnormality within the intracranial right vertebral artery suggesting high-grade stenosis or vessel occlusion. Electronically Signed   By: Kellie Simmering D.O.  On: 09/04/2022 12:01   VAS [place] LOWER EXTREMITY SAPHENOUS VEIN MAPPING  Result Date: 08/31/2022 LOWER EXTREMITY VEIN MAPPING Patient Name:  [facility]  Date of Exam:   08/31/2022 Medical Rec #: [id number]       Accession #:    [id number] Date of Birth: [birth year]      Patient Gender: M Patient Age:   67 years Exam Location:  [facility] Procedure:      VAS [place] LOWER EXTREMITY SAPHENOUS VEIN MAPPING Referring Phys: Vonna Kotyk ROBINS --------------------------------------------------------------------------------  Indications:  Pre-op Risk Factors: PAD.  Limitations: Right Fem-pop bypass graft with saphenous vein 09/05/21 Comparison Study: Prior vein mapping done 09/18/20 Performing Technologist: Sharion Dove RVS  Examination Guidelines: A complete evaluation includes B-mode imaging, spectral Doppler, color Doppler, and power Doppler as needed of all accessible portions of each vessel. Bilateral testing is considered an integral part of a complete examination. Limited examinations for reoccurring indications may be performed as noted. +----------+--------------+-----------------------+----------------+-----------+     RT     RT Findings            GSV          LT Diameter (cm)LT Findings  Diameter                                                                     (cm)                                                                    +----------+--------------+-----------------------+----------------+-----------+             Harvested   Saphenofemoral  Junction      0.67                  +----------+--------------+-----------------------+----------------+-----------+             Harvested       Proximal thigh           0.48                  +----------+--------------+-----------------------+----------------+-----------+             Harvested          Mid thigh             0.40                  +----------+--------------+-----------------------+----------------+-----------+             Harvested        Distal thigh         0.44/0.31     branching  +----------+--------------+-----------------------+----------------+-----------+           not visualized         Knee                0.22                  +----------+--------------+-----------------------+----------------+-----------+           not visualized  Prox calf             0.21                  +----------+--------------+-----------------------+----------------+-----------+           not visualized       Mid calf            0.24/1.8     branching  +----------+--------------+-----------------------+----------------+-----------+           not visualized      Distal calf         0.360/.09     branching  +----------+--------------+-----------------------+----------------+-----------+           not visualized         Ankle            0.15/0.12     branching  +----------+--------------+-----------------------+----------------+-----------+ Diagnosing physician: [name] MD Electronically signed by [name] MD on 08/31/2022 at 6:30:54 PM.    Final    VAS [place] (AVF, AVG)  Result Date: 08/31/2022 DIALYSIS ACCESS Patient Name:  POYRAZ SHAUB  Date of Exam:   08/31/2022 Medical Rec #: [id number]       Accession #:    [id number] Date of Birth: 26-Jul-1955      Patient Gender: M Patient Age:   82 years Exam Location:  [facility] Procedure:      VAS US DUPLEX DIALYSIS ACCESS (AVF, AVG) Referring Phys: Santiago Bumpers  --------------------------------------------------------------------------------  Reason for Exam: No palpable thrill for AVF/AVG. Access Site: Right Upper Extremity. Access Type: Forearm loop AVG. Comparison Study: No prior study Performing Technologist: Sharion Dove RVS  Examination Guidelines: A complete evaluation includes B-mode imaging, spectral Doppler, color Doppler, and power Doppler as needed of all accessible portions of each vessel. Unilateral testing is considered an integral part of a complete examination. Limited examinations for reoccurring indications may be performed as noted.  Findings:   Thrombosed AV graft  Summary: Thrombosed right AV graft  *See table(s) above for measurements and observations.  Diagnosing physician: [name] MD Electronically signed by [name] MD on 08/31/2022 at 6:30:35 PM.    --------------------------------------------------------------------------------   Final    ECHOCARDIOGRAM LIMITED  Result Date: 08/31/2022    ECHOCARDIOGRAM LIMITED REPORT   Patient Name:   Albert [facility] Date of Exam: 08/31/2022 Medical Rec #:  [id number]      Height:       68.0 in Accession #:    [id number]     Weight:       144.4 lb Date of Birth:  [birth year]     BSA:          1.780 m Patient Age:    59 years       BP:           165/52 mmHg Patient Gender: M              HR:           79 bpm. Exam Location:  Inpatient Procedure: Limited Echo, Cardiac Doppler and Limited Color Doppler Indications:    Bacteremia R78.81  History:        Patient has prior history of Echocardiogram examinations, most                 recent 06/29/2022. 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  Sonographer:    Greer Pickerel Referring Phys: Toney Sang SOOD  Sonographer Comments: Image acquisition challenging due to respiratory motion. IMPRESSIONS  1.  Left ventricular ejection fraction, by estimation, is 45%. There is moderate to severe concentric left ventricular hypertrophy. Diastolic function was not assessed.  2. Right ventricular systolic function is normal.  3. The mitral valve is grossly normal with mild thickening of the mitral valve leaflets. Mild mitral valve regurgitation.  4. Right atrial size was mildly dilated.  5. Left atrial size was mildly dilated.  6. The inferior vena cava is normal in size with greater than 50% respiratory variability, suggesting right atrial pressure of 3 mmHg.  7. The aortic valve was not well visualized. There is mild calcification of the aortic valve. There is mild thickening of the aortic valve. Aortic valve regurgitation is not visualized. FINDINGS  Left Ventricle: Left ventricular ejection fraction, by estimation, is 45 to 50%. The left ventricle has mildly decreased function. There is severe concentric left ventricular hypertrophy. Left ventricular diastolic function could not be evaluated. Right Ventricle: No increase in right ventricular wall thickness. Right ventricular systolic function is normal. Left Atrium: Left atrial size was mildly dilated. Right Atrium: Right atrial size was mildly dilated. Pericardium: There is no evidence of pericardial effusion. Mitral Valve: The mitral valve is grossly normal. There is mild thickening of the mitral valve leaflet(s). Mild mitral annular calcification. Mild mitral valve regurgitation. Tricuspid Valve: The tricuspid valve is normal in structure. Tricuspid valve regurgitation is mild. Aortic Valve: The aortic valve was not well visualized. There is mild calcification of the aortic valve. There is mild thickening of the aortic valve. Aortic valve regurgitation is not visualized. Pulmonic Valve: The pulmonic valve was not assessed. Venous: The inferior vena cava is normal in size with greater than 50% respiratory variability, suggesting right atrial pressure of 3 mmHg.  Additional Comments: Spectral Doppler performed. Color Doppler performed.  LEFT VENTRICLE PLAX 2D LVIDd:         4.00 cm LVIDs:         3.00 cm LV PW:         1.60 cm LV IVS:        1.10 cm  LEFT ATRIUM         Index LA diam:    2.80 cm 1.57 cm/m  MITRAL VALVE MV Area (PHT): 2.73 cm MV Decel Time: 278 msec MV E velocity: 72.80 cm/s MV A velocity: 99.00 cm/s MV E/A ratio:  0.74 Aditya Sabharwal Electronically signed by Hebert Soho Signature Date/Time: 08/31/2022/2:22:32 PM    Final    CT HEAD WO CONTRAST (5MM)  Result Date: 08/30/2022 CLINICAL DATA:  Initial evaluation for mental status change. EXAM: CT HEAD WITHOUT CONTRAST TECHNIQUE: Contiguous axial images were obtained from the base of the skull through the vertex without intravenous contrast. RADIATION DOSE REDUCTION: This exam was performed according to the departmental dose-optimization program which includes automated exposure control, adjustment of the mA and/or kV according to patient size and/or use of iterative reconstruction technique. COMPARISON:  Prior CT from 08/07/2021. FINDINGS: Brain: Age-related cerebral atrophy with mild chronic small vessel ischemic disease. Chronic right basal ganglia lacunar infarct. No acute intracranial hemorrhage. No acute large vessel territory infarct. No mass lesion or midline shift. No hydrocephalus or extra-axial fluid collection. Vascular: No abnormal hyperdense vessel. Scattered calcified atherosclerosis  about the skull base, stable. Skull: Scalp soft tissues and calvarium demonstrate no acute finding. Sinuses/Orbits: Globes and orbital soft tissues within normal limits. Paranasal sinuses are clear. Trace right mastoid effusion noted, of doubtful significance. Other: None. IMPRESSION: 1. No acute intracranial abnormality. 2. Age-related cerebral atrophy with mild chronic small vessel ischemic disease, with small remote right basal ganglia lacunar infarct, stable. Electronically Signed   By: Jeannine Boga M.D.   On: 08/30/2022 19:21   DG Abd Portable 1V  Result Date: 08/29/2022 CLINICAL DATA:  Orogastric tube placement EXAM: PORTABLE ABDOMEN - 1 VIEW COMPARISON:  Portable exam 1228 hours compared to 1040 hours FINDINGS: Tip of nasogastric tube projects over distal gastric antrum/pylorus. Lung bases clear. Nonobstructive bowel gas pattern. IMPRESSION: Tip of nasogastric tube projects over distal gastric antrum/pylorus. Electronically Signed   By: Lavonia Dana M.D.   On: 08/29/2022 12:45   DG Abd Portable 1V  Result Date: 08/29/2022 CLINICAL DATA:  Orogastric tube placement EXAM: PORTABLE ABDOMEN - 1 VIEW COMPARISON:  08/12/2022 CT abdomen FINDINGS: The orogastric tube distal tip is in the stomach body with side port in the gastric fundus. Borderline enlargement of the cardiopericardial silhouette. Formed stool in the visualized colon. Mild lumbar spondylosis. Enlargement of the cardiopericardial silhouette IMPRESSION: 1. Orogastric tube tip is in the stomach body with side port in the gastric fundus. 2. Borderline enlargement of the cardiopericardial silhouette. Electronically Signed   By: Van Clines M.D.   On: 08/29/2022 10:52   DG Chest Portable 1 View  Result Date: 08/28/2022 CLINICAL DATA:  Central line placement EXAM: PORTABLE CHEST 1 VIEW COMPARISON:  08/12/2022 FINDINGS: Right IJ line with tip at the upper SVC. Artifact from EKG leads. Nipple shadows asymmetric to the left, confirmed on recent abdominal CT covering the lung bases. There is no edema, consolidation, effusion, or pneumothorax. Normal heart size. IMPRESSION: Right IJ line without complicating feature. Electronically Signed   By: Jorje Guild M.D.   On: 08/28/2022 21:41   CT Angio Aortobifemoral W and/or Wo Contrast  Result Date: 08/28/2022 CLINICAL DATA:  Claudication or leg ischemia EXAM: CT ANGIOGRAPHY OF ABDOMINAL AORTA WITH ILIOFEMORAL RUNOFF TECHNIQUE: Multidetector CT imaging of the abdomen, pelvis and  lower extremities was performed using the standard protocol during bolus administration of intravenous contrast. Multiplanar CT image reconstructions and MIPs were obtained to evaluate the vascular anatomy. RADIATION DOSE REDUCTION: This exam was performed according to the departmental dose-optimization program which includes automated exposure control, adjustment of the mA and/or kV according to patient size and/or use of iterative reconstruction technique. CONTRAST:  67mL OMNIPAQUE IOHEXOL 350 MG/ML SOLN COMPARISON:  CT 08/12/2022 and previous FINDINGS: VASCULAR Aorta: Extensive partially calcified atheromatous plaque throughout. No aneurysm, dissection, or stenosis. 4 mm penetrating atheromatous ulcer along the left posterolateral wall in the infrarenal segment. Celiac: Calcified ostial plaque resulting in short segment stenosis of moderate severity, atheromatous but patent distally. SMA: Significant partially calcified atheromatous plaque extending from the ostium approximally 2.4 cm, with progression to at least moderately severe stenosis since 01/01/2022, with continued patency distally with classic distal branch anatomy. Renals: Single left, with near occlusive ostial plaque extending over length of at least 1.2 cm, patent but atheromatous distally. Single right, with heavily calcified atheromatous plaque through its length , of likely hemodynamic significance. IMA: Atheromatous but patent. RIGHT Lower Extremity Inflow: Common iliac proximal stent patent, distal stent with mild residual/recurrent narrowing at its proximal end, patent across the bifurcation. Internal iliac occluded at its origin External  iliac overlapping stents through nearly its length, with moderate residual/recurrent stenosis at the distal margin at the common femoral artery. Outflow: Common femoral is mildly atheromatous but patent distally. Deep femoral branches patent proximally, segmentally occluded distally. SFA proximal occlusion  extending throughz its length. Patent fem-pop graft, with some retrograde filling of the proximal native popliteal artery. Runoff: Venous opacification in the proximal calf limited evaluation of proximal tibial runoff. Scattered atheromatous calcifications and poor enhancement in the distal calf limiting assessment of distal trifurcation runoff. LEFT Lower Extremity Inflow: Common iliac patent stents through its length Internal iliac origin occlusion External iliac patent stent through its length. Outflow: Common femoral has 1.1 cm thin-necked pseudoaneurysm from its proximal segment, patent distally. Deep femoral branches patent SFA origin occlusion obtaining throughout its length despite presence of stents. Popliteal is reconstituted proximally by collaterals, patent but moderately atheromatous through its distal length. Runoff: Atheromatous but apparently contiguous three-vessel tibial runoff to above the ankle, with extensive vascular calcifications and limited enhancement limiting assessment across the ankle and into the foot. Veins: No obvious venous abnormality within the limitations of this arterial phase study. Review of the MIP images confirms the above findings. NON-VASCULAR Lower chest: No pleural or pericardial effusion. Visualized lung bases clear. Hepatobiliary: No focal liver abnormality is seen. No gallstones, gallbladder wall thickening, or biliary dilatation. Pancreas: Unremarkable. No pancreatic ductal dilatation or surrounding inflammatory changes. Spleen: Normal in size without focal abnormality. 2 cm probable splenule abutting the pancreatic tail, stable since earliest available scan 06/18/2020. Adrenals/Urinary Tract: No adrenal mass. Marked bilateral renal parenchymal atrophy with extensive renal arterial branch calcifications. No hydronephrosis or mass. Urinary bladder is nondistended. Stomach/Bowel: Stomach is partially distended by gas and fluid, no acute findings. Small bowel is  decompressed. Normal appendix. The colon is incompletely distended, with scattered diverticula at the descending/sigmoid junction, no adjacent inflammatory changes. Lymphatic: No abdominal or pelvic adenopathy. Reproductive: Mild prostate enlargement. Other: Subcutaneous gas bubbles overlying left common femoral vessels. There is a deep subcutaneous hematoma overlying the left common femoral vessels with extension into the anterior extraperitoneal space of the pelvis, dominant component measuring 11.6 x 6.3 cm,, and an 8.3 cm right-sided component extending along the pelvic sidewall. No active extravasation is identified. No ascites or hemoperitoneum. No free air. Left pelvic phleboliths. Musculoskeletal: No acute findings. IMPRESSION: 1. 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 2. New anterior pelvic extraperitoneal hematoma measuring up to 11.6 cm diameter. No active extravasation evident. 3. Long segment LEFT SFA occlusion. Critical Value/emergent results were discussed by telephone at the time of interpretation on 08/28/2022 at 7:35 pm with provider [facility] , who verbally acknowledged these results. 4. Residual/recurrent stenosis at the distal margin of the RIGHT external iliac stent. 5. Patent RIGHT fem-pop graft 6. Patent LEFT iliac stents. 7. 4 mm penetrating atheromatous ulcer in the infrarenal abdominal aorta. 8. Colonic diverticulosis. 9. Aortic Atherosclerosis (ICD10-I70.0). Electronically Signed   By: Lucrezia Europe M.D.   On: 08/28/2022 19:40   HYBRID OR IMAGING ([facility] ONLY)  Result Date: 08/22/2022 There is no interpretation for this exam.  This order is for images obtained during a surgical procedure.  Please See "Surgeries" Tab for more information regarding the procedure.   CT ABDOMEN PELVIS W CONTRAST  Result Date: 08/12/2022 CLINICAL DATA:  Abdominal pain, nausea, vomiting, diarrhea, recent diagnosis of C. difficile EXAM: CT ABDOMEN AND PELVIS WITH CONTRAST  TECHNIQUE: Multidetector CT imaging of the abdomen and pelvis was performed using the standard protocol  following bolus administration of intravenous contrast. RADIATION DOSE REDUCTION: This exam was performed according to the departmental dose-optimization program which includes automated exposure control, adjustment of the mA and/or kV according to patient size and/or use of iterative reconstruction technique. CONTRAST:  41mL OMNIPAQUE IOHEXOL 350 MG/ML SOLN COMPARISON:  01/01/2022 FINDINGS: Lower chest: Cardiomegaly.  Trace bilateral pleural effusions. Hepatobiliary: No focal liver abnormality is seen. Hyperattenuating fluid within the gallbladder suggesting vicarious excretion of contrast. No pericholecystic inflammatory changes by CT. No biliary dilatation. Pancreas: Splenule is again noted abutting the distal pancreatic tail. Pancreas appears otherwise unremarkable. No pancreatic ductal dilatation or surrounding inflammatory changes. Spleen: Normal in size without focal abnormality. Adrenals/Urinary Tract: Adrenal glands are unremarkable. Kidneys are normal, without renal calculi, focal lesion, or hydronephrosis. Bladder is decompressed. Stomach/Bowel: Stomach within normal limits. No dilated loops of bowel. Long segment mild circumferential wall thickening of the sigmoid colon, and to a lesser degree involving the descending colon. There are numerous left-sided colonic diverticula, although no focally inflamed diverticulum is seen. A normal appendix is seen in the right lower quadrant. Vascular/Lymphatic: Severe aortoiliac atherosclerosis without aneurysm. No abdominopelvic lymphadenopathy. Reproductive: Prostate is unremarkable. Other: Trace perihepatic ascites. Small volume retroperitoneal fluid within the anterolateral aspect of the left hemipelvis measuring greater than fluid density. Approximate measurements of 6.5 x 1.8 x 5.1 cm (volume = 31 cm^3). No pneumoperitoneum. Musculoskeletal: No acute or  significant osseous findings. IMPRESSION: 1. Small volume retroperitoneal fluid within the left hemipelvis measuring greater than fluid density, suggestive of a retroperitoneal hematoma of indeterminate etiology. 2. Long segment mild circumferential wall thickening of the sigmoid colon, and to a lesser degree involving the descending colon. Findings are suggestive of an infectious or inflammatory colitis. 3. Trace perihepatic ascites. 4. Trace bilateral pleural effusions. 5. Severe aortoiliac atherosclerosis (ICD10-I70.0). Electronically Signed   By: Davina Poke D.O.   On: 08/12/2022 16:41   DG Chest Port 1 View  Result Date: 08/12/2022 CLINICAL DATA:  Shortness of breath EXAM: PORTABLE CHEST 1 VIEW COMPARISON:  CXR 07/21/22 FINDINGS: No pleural effusion. No pneumothorax. Unchanged cardiomegaly. Unchanged mediastinal contours. Focal airspace opacity. Redemonstrated are prominent bilateral interstitial opacities, which could represent pulmonary venous congestion or pulmonary edema. No radiographically apparent displaced rib fractures. Visualized upper abdomen is unremarkable. IMPRESSION: 1. Unchanged cardiomegaly with prominent bilateral interstitial opacities, which could represent pulmonary venous congestion or pulmonary edema. 2. No focal airspace opacity. Electronically Signed   By: Marin Roberts M.D.   On: 08/12/2022 14:41   PERIPHERAL VASCULAR CATHETERIZATION  Result Date: 08/10/2022 Table formatting from the original result was not included. Images from the original result were not included. PATIENT: Albert Harris      MRN: [id number] DOB: 11-Aug-1955    DATE OF PROCEDURE: 08/10/2022 INDICATIONS:  ENDY MAZZAFERRO is a 67 y.o. male who presented with rest pain of his left foot.  He has undergone a previous right iliofemoral endarterectomy and a right femoropopliteal bypass.  He is also had bilateral iliac stents. PROCEDURE:  Conscious sedation Ultrasound-guided access to the left common femoral artery  Aortogram with left lower extremity runoff Angioplasty and stenting of the left external iliac artery (7 mm x 5 cm Viabahn stent x 2; postdilatation with a 6 mm balloon) SURGEON: Judeth Cornfield. Scot Dock, MD, FACS ANESTHESIA: Local with sedation EBL: Minimal TECHNIQUE: The patient was brought to the peripheral vascular lab and was sedated. The period of conscious sedation was 105 minutes.  During that time period, I was present face-to-face 100% of  the time.  The patient was administered initially 1 mg of Versed and 50 mcg of fentanyl. [name] heart rate, blood pressure, and oxygen saturation were monitored by the nurse continuously during the procedure. Harris groins were prepped and draped in the usual sterile fashion.  Under ultrasound guidance, after the skin was anesthetized, I cannulated the left common femoral artery with a micropuncture needle and a micropuncture sheath was introduced over a wire.  By ultrasound the femoral artery was patent.  There was significant calcific disease in the common femoral artery.  A real-time image was obtained and sent to the server.This was exchanged for a 5 [place] sheath over a Bentson wire.  It was difficult to pass the wire and ultimately the wire and sheath came out.  I held pressure for 10 minutes.  There was no evidence of hematoma.  I then under ultrasound guidance again accessed the left common femoral artery with a micropuncture needle.  This time I used the Nitrex wire and was able to navigate into the common iliac artery and advanced the micropuncture sheath over the wire.  I then used an angled Glidewire to get up into the infrarenal aorta.  I exchanged this for a Rosen wire over a straight catheter.  I then placed a 5 French sheath in the left groin.  The pigtail catheter was positioned at the L1 vertebral body and flush aortogram obtained.  The cath was positioned above the aortic bifurcation and an oblique iliac projection was obtained.  The patient had  significant disease in the external iliac artery on the left and I elected to address this with angioplasty and stenting.  I attempted to advance a 7 [place] destination sheath over the [facility] wire but met significant resistance.  Therefore the patient was heparinized.  ACT was monitored throughout the procedure.  I dilated the area of concern in the external iliac artery with a 5 mm x 4 cm balloon.  At this point I was able to advance the 7 French sheath.  I then exchanged wires for a 0.018 wire.  I selected a 7 x 50 Viabahn stent.  This was positioned below the takeoff of the hypogastric artery which was occluded.  This was deployed without difficulty and postdilatation was done with a 6 mm balloon.  There was an area of concern the distal stent and I elected to cover this.  A second 7 mm x 50 mm Viabahn stent was overlapped to cover the area of concern and this was deployed without difficulty.  Postdilatation was done with a 6 mm balloon.  There was an excellent result with no residual stenosis. Runoff film was then obtained on the left.  At the at the completion we exchanged the long 7 French sheath for a short 7 [place] sheath which was easily advanced into the iliac system on the left at this point. FINDINGS: The infrarenal aorta is patent with no significant stenosis identified.  The renal arteries are patent with no significant renal artery stenosis identified. On the right side the common iliac and external iliac arteries are patent. On the left side the common iliac artery is patent and it has previously been stented.  There was diffuse disease in the external iliac artery with up to an 80% stenosis.  This was successfully addressed with angioplasty and stenting as described above with no residual stenosis. Low that the common femoral had calcific disease but was patent.  The deep femoral artery is patent.  The superficial  femoral artery is occluded at its origin with reconstitution of the above-knee  popliteal artery.  The patient has diffuse tibial disease.  The dominant runoff is the peroneal artery which occludes above the foot.  The anterior tibial artery has diffuse disease proximally and then occludes in the mid leg.  There is reconstitution of the distal anterior tibial and dorsalis pedis artery on the left.  The posterior tibial artery is occluded and reconstitutes above the ankle with moderate disease. CLINICAL NOTE: Hopefully by addressing the inflow this will help with his rest pain.  Otherwise we would have to consider infrainguinal bypass.  He has diffuse calcific disease in the common femoral artery and the stent extends very low.  I would like to avoid bypass if possible.  However when he returns for a follow-up visit we will obtain vein mapping.  He has not been taking aspirin or a statin.  He was started on 81 mg of aspirin, 75 mg of Plavix daily, and 10 mg of Crestor daily.  This was sent to his pharmacy. WIfI 0 1 0 Clinical stage I [name], MD, FACS [facility]    Labs:  CBC: Recent Labs    09/05/22 1405 09/05/22 1919 09/05/22 2248 09/06/22 0608  WBC 19.7* 20.0* 20.3* 19.8*  HGB 7.0* 8.5* 8.6* 9.2*  HCT 21.0* 26.5* 25.4* 27.7*  PLT 192 192 193 189    COAGS: Recent Labs    09/07/21 0140 06/28/22 1500 06/29/22 0438 08/12/22 1501  INR  --  1.3* 1.3* 1.2  APTT 30  --   --   --     BMP: Recent Labs    09/03/22 0952 09/04/22 0838 09/05/22 0053 09/06/22 0608  NA 133* 134* 131* 133*  K 3.7 3.7 3.8 4.3  CL 95* 94* 95* 96*  CO2 25 24 21* 19*  GLUCOSE 95 90 100* 92  BUN 26* 48* 61* 81*  CALCIUM 7.8* 8.7* 8.3* 8.5*  CREATININE 2.59* 4.21* 5.41* 7.28*  GFRNONAA 26* 15* 11* 8*    LIVER FUNCTION TESTS: Recent Labs    07/01/22 0735 07/22/22 0023 07/23/22 0725 08/12/22 1501 08/13/22 0359 08/28/22 1810 08/29/22 0500 09/03/22 0952 09/04/22 0838 09/05/22 0053 09/06/22 0608  BILITOT 1.0 1.5*  --  1.3*  --  1.2   --   --   --   --   --   AST 14* 22  --  18  --  21  --   --   --   --   --   ALT 8 19  --  8  --  <5  --   --   --   --   --   ALKPHOS 70 110  --  106  --  122  --   --   --   --   --   PROT 6.5 7.5  --  7.7  --  7.4  --   --   --   --   --   ALBUMIN 2.9* 3.5   < > 3.5   < > 2.9*   < > 1.9* 2.2* 1.9* 2.0*   < > = values in this interval not displayed.    TUMOR MARKERS: No results for input(s): "AFPTM", "CEA", "CA199", "CHROMGRNA" in the last 8760 hours.  Assessment and Plan:  67 y/o M with history of ESRD on HD via RUE AVG who presented to the ED 08/28/22 with complaints of abdominal pain, hypotension and  cold LLE after thrombectomy 3/16 with VVS. He was found to have hyperkalemia from missing a week of HD due to pain and a left femoral pseudoaneurysm requiring intervention with VVS. He later developed MRSA bacteremia and presumed endocarditis with source likely being infected pseudoaneurysm per ID. He has undergone line holiday and has blood cultures from 3/27 which show NGTD. IR has been consulted for tunneled HD catheter placement for ongoing HD needs.  Patient currently afebrile with persistent leukocytosis ~20 since 3/28, blood cultures have been repeated on 3/27 and show NGTD, he has undergone line holiday since 3/28. ID, CCM and VVS are agreeable to new line. Given leukocytosis will need to have tunneled HD catheter placement reviewed by IR attending on 4/1 -- discussed with CCM/nephrology and they are agreeable to temporary HD catheter but would prefer tunneled if possible. I discussed with patient that we may place a tunneled or temporary line tomorrow depending on decision of IR attending physician which he is agreeable to. There are 2 separate consents in the patient's chart - one for temporary HD cath and one for tunneled HD catheter, if we are unable to place a tunneled HD catheter tomorrow we will follow for timing of appropriate placement.  Risks and benefits discussed with the  patient including, but not limited to bleeding, infection, vascular injury, pneumothorax which may require chest tube placement, air embolism or even death.  All of the patient's questions were answered, patient is agreeable to proceed.  Consent signed and in chart.  Thank you for this interesting consult.  I greatly enjoyed meeting Albert Harris and look forward to participating in their care.  A copy of this report was sent to the requesting provider on this date.  Electronically Signed: Joaquim Nam, PA-C 09/06/2022, 12:23 PM   I spent a total of 40 Minutes in face to face in clinical consultation, greater than 50% of which was counseling/coordinating care for end stage renal disease.

## 2022-09-06 NOTE — Progress Notes (Signed)
Nephrology Follow-Up Consult note   Assessment/Recommendations: Albert Harris is a/an 67 y.o. male with a past medical history significant for ESRD, PAD, CHF, HTN, admitted for critical limb ischemia.       ESRD: TTS and [place] via RUE AVG. was initially on CRRT given severe hemodynamic compromise.  Stopped CRRT on the morning of 3/26.  Received dialysis morning of 09/03/2022.  Plan for HD tomorrow after catheter placementr  Left common femoral artery pseudoaneurysm: Status post surgery with Dr. Unk Lightning on the morning of 08/29/2022 status post repair.  Overall much improved  CVA: stroke on MRI. Neuro following  Shock/MRSA Bacteremia: Likely multifactorial with infectious component. MRSA treatment per primary.  Shock resolved. TEE when able   Anemia: Likely multifactorial.  No IV iron given bacteremia. ESA ordered.  Dialysis access: AVG clotted attempted declot on 3/27 but unsuccessful. Temp cath removed 3/28. Plan for [facility] tomorrow with IR if permissible from infection stand point.   Recommendations conveyed to primary service.    [facility] 09/06/2022 2:39 PM  ___________________________________________________________  CC: Femoral artery pseudoaneurysm  Interval History/Subjective: Patient resting today with no complaints.   Medications:  Current Facility-Administered Medications  Medication Dose Route Frequency Provider Last Rate Last Admin   (feeding supplement) PROSource Plus liquid 30 mL  30 mL Oral BID BM Baglia, Corrina, PA-C   30 mL at 09/03/22 1648   0.9 %  sodium chloride infusion (Manually program via Guardrails IV Fluids)   Intravenous Once Baglia, Corrina, PA-C       0.9 %  sodium chloride infusion   Intra-arterial PRN Baglia, Corrina, PA-C       0.9 %  sodium chloride infusion  250 mL Intravenous Continuous Baglia, Corrina, PA-C   Stopped at 09/01/22 0400   acetaminophen (TYLENOL) tablet 650 mg  650 mg Oral Q6H PRN Baglia, Corrina,  PA-C   650 mg at 09/05/22 1052   Or   acetaminophen (TYLENOL) suppository 650 mg  650 mg Rectal Q4H PRN Baglia, Corrina, PA-C       alteplase (CATHFLO ACTIVASE) injection 2 mg  2 mg Intracatheter Once PRN Baglia, Corrina, PA-C       Chlorhexidine Gluconate Cloth 2 % PADS 6 each  6 each Topical Q0600 Baglia, Corrina, PA-C   6 each at 09/06/22 [id number]   Darbepoetin Alfa (ARANESP) injection 100 mcg  100 mcg Subcutaneous Q Wed-1800 Baglia, Corrina, PA-C   100 mcg at 09/02/22 1731   docusate sodium (COLACE) capsule 100 mg  100 mg Oral BID PRN Baglia, Corrina, PA-C       feeding supplement (NEPRO CARB STEADY) liquid 237 mL  237 mL Oral BID BM Baglia, Corrina, PA-C   237 mL at 09/03/22 1648   heparin injection 1,000 Units  1,000 Units Dialysis PRN Baglia, Corrina, PA-C       hydrALAZINE (APRESOLINE) injection 10 mg  10 mg Intravenous Q4H PRN Baglia, Corrina, PA-C   10 mg at 09/06/22 0749   HYDROmorphone (DILAUDID) injection 0.5 mg  0.5 mg Intravenous Q3H PRN Margaretha Seeds, MD   0.5 mg at 09/06/22 1323   multivitamin (RENA-VIT) tablet 1 tablet  1 tablet Oral QHS Baglia, Corrina, PA-C   1 tablet at 09/04/22 2131   ondansetron (ZOFRAN) injection 4 mg  4 mg Intravenous Q6H PRN Baglia, Corrina, PA-C       Oral care mouth rinse  15 mL Mouth Rinse PRN Baglia, Corrina, PA-C       oxyCODONE (Oxy  IR/ROXICODONE) immediate release tablet 5 mg  5 mg Oral Q4H PRN Margaretha Seeds, MD   5 mg at 09/06/22 0951   pantoprazole (PROTONIX) EC tablet 40 mg  40 mg Oral Daily Baglia, Corrina, PA-C   40 mg at 09/06/22 [id number]   pentafluoroprop-tetrafluoroeth (GEBAUERS) aerosol 1 Application  1 Application Topical PRN Baglia, Corrina, PA-C       polyethylene glycol (MIRALAX / GLYCOLAX) packet 17 g  17 g Oral Daily PRN Baglia, Corrina, PA-C       rosuvastatin (CRESTOR) tablet 10 mg  10 mg Oral Daily Rosalin Hawking, MD   10 mg at 09/06/22 1322   sodium chloride flush (NS) 0.9 % injection 10-40 mL  10-40 mL Intracatheter Q12H Baglia,  Corrina, PA-C   10 mL at 09/06/22 0952   sodium chloride flush (NS) 0.9 % injection 10-40 mL  10-40 mL Intracatheter PRN Baglia, Corrina, PA-C          Review of Systems: 10 systems reviewed and negative except per HPI  Physical Exam: Vitals:   09/06/22 1100 09/06/22 1200  BP: (!) 118/53 (!) 114/49  Pulse: 88 87  Resp: (!) 29 (!) 30  Temp:    SpO2: 93% 92%   Total I/O In: 210 [P.O.:200; I.V.:10] Out: -   Intake/Output Summary (Last 24 hours) at 09/06/2022 1439 Last data filed at 09/06/2022 1300 Gross per 24 hour  Intake 1718.74 ml  Output --  Net 1718.74 ml   Constitutional: lying in bed, nad ENMT: ears and nose without scars or lesions, MMM CV: normal rate, no edema Respiratory: bilateral chest rise, no iwob Gastrointestinal: soft, nonended Skin: no visible lesions or rashes Psych: mood and affect appropriate    Test Results I personally reviewed new and old clinical labs and radiology tests Lab Results  Component Value Date   NA 133 (L) 09/06/2022   K 4.3 09/06/2022   CL 96 (L) 09/06/2022   CO2 19 (L) 09/06/2022   BUN 81 (H) 09/06/2022   CREATININE 7.28 (H) 09/06/2022   GFR 58.30 (L) 01/19/2017   CALCIUM 8.5 (L) 09/06/2022   ALBUMIN 2.0 (L) 09/06/2022   PHOS 6.3 (H) 09/06/2022    CBC Recent Labs  Lab 09/05/22 1919 09/05/22 2248 09/06/22 0608  WBC 20.0* 20.3* 19.8*  HGB 8.5* 8.6* 9.2*  HCT 26.5* 25.4* 27.7*  MCV 95.7 91.4 92.3  PLT 192 193 189

## 2022-09-06 NOTE — Progress Notes (Addendum)
NAME:  JAKARRI HOLZ, MRN:  [id number], DOB:  Mar 12, 1956, LOS: 9 ADMISSION DATE:  08/28/2022, CONSULTATION DATE:  08/28/22 REFERRING MD: Jeannine Kitten , CHIEF COMPLAINT:  Abdominal pain  History of Present Illness:   67 yo male brought to ER with abdominal pain, nausea, diaphoresis and unable to feel his Lt foot.  Hypotensive with lactic acidosis in ER.  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  Hx of ESRD and had hyperkalemia.  Had emergent CRRT.  Vascular consulted and taken to OR.    Pertinent  Medical History  ESRD -AV fistula (no flow tonight), PAD, COPD, HLD, HTN, HFrEF 35-40%, PAD  Significant Hospital Events: Including procedures, antibiotic start and stop dates in addition to other pertinent events   3/22 Admit, emergent CRRT, to OR for repair of distal Lt external iliac artery 3/24 Extubated. CT head obtained for altered mental status did not show any acute abnormalities 3/26 Off CRRT and pressors, Vascular surgery consulted for clotted AVG 3/27 AV graft thrombectomy attempted in OR but patient became hemodynamically unstable and procedure was aborted.  Was on pressors briefly 3/28 dialysis followed by CVC and aline removal for line holiday 3/29 MRI brain ordered for LLE weakness and found with right subinsular 32mm infarct. Neuro consulted 3/30 CT A/P increased hematoma in left pelvic region associated with Hg drop 9>7  Interim History / Subjective:   Worsening abdominal pain. CT with increased hematoma in left pelvic region. Transient hypotension improved with 1 L NS and PRBC x 1. Some oozing from left groin wound otherwise no tenderness with palpation. Continues to have abdominal pain otherwise hemodynamically stable overnight   Objective   Blood pressure (!) 161/81, pulse 76, temperature 98.6 F (37 C), temperature source Oral, resp. rate (!) 29, height 5\' 8"  (1.727 m), weight 60.2 kg, SpO2 97 %.        Intake/Output Summary (Last 24 hours) at  09/06/2022 0754 Last data filed at 09/05/2022 1800 Gross per 24 hour  Intake 2008.83 ml  Output --  Net 2008.83 ml   Filed Weights   09/04/22 0500 09/05/22 0500 09/06/22 0500  Weight: 56.6 kg 57.3 kg 60.2 kg   Physical Exam: General: Chronically ill-appearing, no acute distress HENT: [place], AT, OP clear, MMM Eyes: EOMI, no scleral icterus Respiratory: Clear to auscultation bilaterally.  No crackles, wheezing or rales Cardiovascular: RRR, -M/R/G, no JVD GI: BS+, soft, mild suprapubic tenderness/induration to palpation, no rebound abdominal pain. Minimal bloody drainage on left groin incision, no active bleed with no expression on palpation, nontender. Extremities:-Edema,-tenderness Neuro: AAO x2, CNII-XII grossly intact, LLE weakness, able to bend knee and wiggle toes. Sensation intact  BMET reviewed. CO2 19, BUN 81.   Resolved Hospital Problem list   Anion gap metabolic acidosis with lactic acidosis, Hyperkalemia Acute respiratory failure 2nd to acidosis and uremia.   Assessment & Plan:  MRSA bacteremia 2/2 post-op wound infection --Continue Vanc. Following levels --Will need suppressive regimen after acute treatment --Appreciate ID involvement. Recommend line holiday x 72 hours starting 3/28 --MRI L neg --Bcx 3/27 NTD --TEE postponed until 4/1due to chest pain.   Lt common femoral artery pseudoaneurysm s/p Lt groin hematoma evacuation, primary repair of distal external iliac artery. --Postop care per vascular surgery. OK to DC heparin per team in setting of bleed  ESRD. --Line holiday 3/28  --Plan for tunnelled cath 4/1. Labs with no indication for dialysis today --IR contacted team with concerns for elevated WBC. From pulmonary standpoint, bacteremia  improving with decreasing WBC and negative blood cultures >72 hours in hemodynamically stable patient  Acute blood loss anemia - CT 3/30 with increased anterior pelvic extraperitoneal hematoma  Transient hemorrhagic shock - no  pressors required Anemia of critical illness and chronic disease. --DC heparin. Was indicated for limb ischemia/CRRT --S/p PRBC x 1 on 3/30. Improved Hg to 9 --Trend CBC BID --Discussed case with Vascular and plan for medical management --If rebleeds, plan for CTA A/P  Chronic systolic CHF with EF 35 to 40% on Echo from 06/29/22. Hx of pulmonary hypertension. --Hold outpt toprol, asa, plavix  Acute metabolic encephalopathy from uremia, acidosis. Acute 65mm right subinsular stroke  Monitor CT head on 3/24 was normal. --Further imaging and work-up per Neuro. Appreciate consult team's assistance  Hx of COPD. --On room air  Will remain in ICU. OK to transfer after tunneled cath. If unable to obtain tunneled cath then will need temp HD catheter for dialsysi.  Best Practice (right click and "Reselect all SmartList Selections" daily)   Diet/type: Regular consistency (see orders) DVT prophylaxis: systemic heparin  GI prophylaxis: PPI. Lines: N/A  Foley:  N/A Code Status:  full code Last date of multidisciplinary goals of care discussion [x]    Critical care time:

## 2022-09-06 NOTE — Plan of Care (Signed)
  Problem: Education: Goal: Understanding of CV disease, CV risk reduction, and recovery process will improve Outcome: Progressing Goal: Individualized Educational Video(s) Outcome: Progressing   Problem: Activity: Goal: Ability to return to baseline activity level will improve Outcome: Progressing   Problem: Cardiovascular: Goal: Ability to achieve and maintain adequate cardiovascular perfusion will improve Outcome: Progressing Goal: Vascular access site(s) Level 0-1 will be maintained Outcome: Progressing   Problem: Health Behavior/Discharge Planning: Goal: Ability to safely manage health-related needs after discharge will improve Outcome: Progressing   Problem: Education: Goal: Knowledge of prescribed regimen will improve Outcome: Progressing   Problem: Activity: Goal: Ability to tolerate increased activity will improve Outcome: Progressing   Problem: Bowel/Gastric: Goal: Gastrointestinal status for postoperative course will improve Outcome: Progressing   Problem: Clinical Measurements: Goal: Postoperative complications will be avoided or minimized Outcome: Progressing Goal: Signs and symptoms of graft occlusion will improve Outcome: Progressing   Problem: Skin Integrity: Goal: Demonstration of wound healing without infection will improve Outcome: Progressing   Problem: Education: Goal: Knowledge of General Education information will improve Description: Including pain rating scale, medication(s)/side effects and non-pharmacologic comfort measures Outcome: Progressing   Problem: Health Behavior/Discharge Planning: Goal: Ability to manage health-related needs will improve Outcome: Progressing   Problem: Clinical Measurements: Goal: Ability to maintain clinical measurements within normal limits will improve Outcome: Progressing Goal: Will remain free from infection Outcome: Progressing Goal: Diagnostic test results will improve Outcome: Progressing Goal:  Respiratory complications will improve Outcome: Progressing Goal: Cardiovascular complication will be avoided Outcome: Progressing   Problem: Activity: Goal: Risk for activity intolerance will decrease Outcome: Progressing   Problem: Nutrition: Goal: Adequate nutrition will be maintained Outcome: Progressing   Problem: Coping: Goal: Level of anxiety will decrease Outcome: Progressing   Problem: Elimination: Goal: Will not experience complications related to bowel motility Outcome: Progressing Goal: Will not experience complications related to urinary retention Outcome: Progressing   Problem: Pain Managment: Goal: General experience of comfort will improve Outcome: Progressing   Problem: Safety: Goal: Ability to remain free from injury will improve Outcome: Progressing   Problem: Skin Integrity: Goal: Risk for impaired skin integrity will decrease Outcome: Progressing   

## 2022-09-06 NOTE — Progress Notes (Signed)
STROKE TEAM PROGRESS NOTE   SUBJECTIVE (INTERVAL HISTORY) His daughter is at the bedside. Pt lying in bed, still has b/l leg weakness, L>R. He had b/l leg arterial doppler showed severe vasculopathy left worse than right. Left popliteal, posterior tibial, anterior tibial, and peroneal arteries monophasic and dampened monophasic flow, which could potentially explaining the leg weakness and pain.   Abdominal pain and CT yesterday showed increased hematoma in left pelvic region. Also transient hypotension responded to IVF and PRBC 1 unit. Currently stable. VVS on board, plan for HD catheter tomorrow and CTA if needed.    OBJECTIVE Temp:  [97.7 F (36.5 C)-99.3 F (37.4 C)] 99.1 F (37.3 C) (03/31 0700) Pulse Rate:  [65-87] 86 (03/31 1000) Cardiac Rhythm: Normal sinus rhythm (03/31 1000) Resp:  [15-35] 28 (03/31 1000) BP: (80-168)/(43-96) 168/74 (03/31 1000) SpO2:  [83 %-100 %] 96 % (03/31 1000) Weight:  [60.2 kg] 60.2 kg (03/31 0500)  Recent Labs  Lab 08/31/22 1608 08/31/22 1942 08/31/22 2339 09/01/22 0353 09/01/22 0750  GLUCAP 100* 104* 103* 92 81   Recent Labs  Lab 08/31/22 0330 08/31/22 1600 09/01/22 0340 09/01/22 0557 09/02/22 0424 09/02/22 1505 09/03/22 0952 09/04/22 0838 09/05/22 0053 09/06/22 0608  NA 134*   < > 135   < > 132* 132* 133* 134* 131* 133*  K 4.9   < > 4.5   < > 4.2 5.0 3.7 3.7 3.8 4.3  CL 101   < > 97*   < > 98  --  95* 94* 95* 96*  CO2 24   < > 23   < > 21*  --  25 24 21* 19*  GLUCOSE 104*   < > 96   < > 102*  --  95 90 100* 92  BUN 31*   < > 25*   < > 59*  --  26* 48* 61* 81*  CREATININE 3.06*   < > 2.21*   < > 3.92*  --  2.59* 4.21* 5.41* 7.28*  CALCIUM 7.8*   < > 8.4*   < > 8.2*  --  7.8* 8.7* 8.3* 8.5*  MG 2.5*  --  2.7*  --  2.7*  --   --  2.3  --   --   PHOS 3.4   < > 3.0   < > 3.2  --  2.7 4.2 4.7* 6.3*   < > = values in this interval not displayed.   Recent Labs  Lab 09/02/22 0424 09/03/22 0952 09/04/22 0838 09/05/22 0053  09/06/22 0608  ALBUMIN 1.9* 1.9* 2.2* 1.9* 2.0*   Recent Labs  Lab 09/05/22 0053 09/05/22 1405 09/05/22 1919 09/05/22 2248 09/06/22 0608  WBC 19.9* 19.7* 20.0* 20.3* 19.8*  HGB 7.5* 7.0* 8.5* 8.6* 9.2*  HCT 22.9* 21.0* 26.5* 25.4* 27.7*  MCV 96.2 95.5 95.7 91.4 92.3  PLT 199 192 192 193 189   No results for input(s): "CKTOTAL", "CKMB", "CKMBINDEX", "TROPONINI" in the last 168 hours. No results for input(s): "LABPROT", "INR" in the last 72 hours. No results for input(s): "COLORURINE", "LABSPEC", "PHURINE", "GLUCOSEU", "HGBUR", "BILIRUBINUR", "KETONESUR", "PROTEINUR", "UROBILINOGEN", "NITRITE", "LEUKOCYTESUR" in the last 72 hours.  Invalid input(s): "APPERANCEUR"     Component Value Date/Time   CHOL 94 09/05/2022 1405   CHOL 154 11/27/2020 1011   TRIG 102 09/05/2022 1405   HDL 29 (L) 09/05/2022 1405   HDL 53 11/27/2020 1011   CHOLHDL 3.2 09/05/2022 1405   VLDL 20 09/05/2022 1405   LDLCALC 45 09/05/2022 1405  LDLCALC 89 11/27/2020 1011   Lab Results  Component Value Date   HGBA1C 5.0 08/29/2021      Component Value Date/Time   LABOPIA NONE DETECTED 07/18/2019 0603   COCAINSCRNUR NONE DETECTED 07/18/2019 0603   LABBENZ NONE DETECTED 07/18/2019 0603   AMPHETMU NONE DETECTED 07/18/2019 0603   THCU NONE DETECTED 07/18/2019 0603   LABBARB NONE DETECTED 07/18/2019 0603    No results for input(s): "ETH" in the last 168 hours.  I have personally reviewed the radiological images below and agree with the radiology interpretations.  VAS [place] LOWER EXTREMITY ARTERIAL DUPLEX  Result Date: 09/05/2022 LOWER EXTREMITY ARTERIAL DUPLEX STUDY Patient Name:  Albert Harris  Date of Exam:   09/05/2022 Medical Rec #: [id number]       Accession #:    [id number] Date of Birth: 03/19/1956      Patient Gender: M Patient Age:   67 years Exam Location:  [facility] [place] Procedure:      VAS [place] LOWER EXTREMITY ARTERIAL DUPLEX Referring Phys: Cornelius Moras Tarin Johndrow  --------------------------------------------------------------------------------  Indications: Peripheral artery disease, and Acute pain in lower back and left              leg. Anterior pelvic extraperitoneal hematoma within the prevesical              space. Right > left extension of hemorrhage within the              extraperitoneal lateral pelvic walls by CT today. High Risk Factors: Hypertension, hyperlipidemia, prior CVA. Other Factors: ESRD on dialysis,.  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 Current ABI:            n/a Comparison Study: Prior duplex of right fem-pop bypass done 08/06/22 indicating                   patent bypass Performing Technologist: Sharion Dove RVS  Examination Guidelines: A complete evaluation includes B-mode imaging, spectral Doppler, color Doppler, and power Doppler as needed of all accessible portions of each vessel. Bilateral testing is considered an integral part of a complete examination. Limited examinations for reoccurring indications may be performed as noted.  +----------+--------+-----+---------------+--------+--------+ RIGHT     PSV cm/sRatioStenosis       WaveformComments +----------+--------+-----+---------------+--------+--------+ ATA Mid   169          30-49% stenosis                 +----------+--------+-----+---------------+--------+--------+ ATA Distal147                                          +----------+--------+-----+---------------+--------+--------+ PTA Prox  114                                          +----------+--------+-----+---------------+--------+--------+ PTA Mid   116                                          +----------+--------+-----+---------------+--------+--------+  PTA [id number]          30-49% stenosis                  +----------+--------+-----+---------------+--------+--------+ DP        0                                            +----------+--------+-----+---------------+--------+--------+  Right Graft #1: Fem-Pop +------------------+--------+--------+----------+-----------+                   PSV cm/sStenosisWaveform  Comments    +------------------+--------+--------+----------+-----------+ Inflow            130             monophasic            +------------------+--------+--------+----------+-----------+ Prox Anastomosis  110                       multiphasic +------------------+--------+--------+----------+-----------+ Proximal Graft    99                                    +------------------+--------+--------+----------+-----------+ Mid Graft         88                                    +------------------+--------+--------+----------+-----------+ Distal Graft      86                                    +------------------+--------+--------+----------+-----------+ Distal Anastomosis116                                   +------------------+--------+--------+----------+-----------+ Outflow                                                 +------------------+--------+--------+----------+-----------+   +-----------+--------+-----+--------+-------------------+-----------------+ LEFT       PSV cm/sRatioStenosisWaveform           Comments          +-----------+--------+-----+--------+-------------------+-----------------+ CFA Prox   88                   monophasic                           +-----------+--------+-----+--------+-------------------+-----------------+ DFA        82                   monophasic                           +-----------+--------+-----+--------+-------------------+-----------------+ SFA Prox                occluded                                      +-----------+--------+-----+--------+-------------------+-----------------+ SFA Mid  occluded                                     +-----------+--------+-----+--------+-------------------+-----------------+ SFA Distal                      monophasic         collateral flow   +-----------+--------+-----+--------+-------------------+-----------------+ POP Prox   34                   monophasic                           +-----------+--------+-----+--------+-------------------+-----------------+ POP Distal 33                   monophasic                           +-----------+--------+-----+--------+-------------------+-----------------+ ATA Prox   23                   dampened monophasic                  +-----------+--------+-----+--------+-------------------+-----------------+ ATA Mid    26                   dampened monophasiccollaterals noted +-----------+--------+-----+--------+-------------------+-----------------+ ATA Distal                      dampened monophasic                  +-----------+--------+-----+--------+-------------------+-----------------+ PTA Prox   26                   dampened monophasic                  +-----------+--------+-----+--------+-------------------+-----------------+ PTA Mid    26                   dampened monophasic                  +-----------+--------+-----+--------+-------------------+-----------------+ PTA Distal 28                   dampened monophasic                  +-----------+--------+-----+--------+-------------------+-----------------+ PERO Prox  20                   dampened monophasic                  +-----------+--------+-----+--------+-------------------+-----------------+ PERO Mid   34                   dampened monophasic                  +-----------+--------+-----+--------+-------------------+-----------------+ PERO Distal29                   dampened  monophasiccollateral flow   +-----------+--------+-----+--------+-------------------+-----------------+ DP                              not visualized                       +-----------+--------+-----+--------+-------------------+-----------------+  Summary: Right: No significant change compared to previous study. Patent fem-pop bypass graft. 30-49% stenosis  noted in the mid ATA and distal PTA. Left: Chronically occluded SFA. Monophasic and dampened monophasic flow noted in the popliteal, posterior tibial, anterior tibial, and peroneal arteries. Collateral flow noted in the distal SFA, mid ATA, and distal peroneal.  See table(s) above for measurements and observations. Electronically signed by Harold Barban MD on 09/05/2022 at 9:11:41 PM.    Final    VAS US CAROTID  Result Date: 09/05/2022 Carotid Arterial Duplex Study Patient Name:  Albert Harris  Date of Exam:   09/05/2022 Medical Rec #: [id number]       Accession #:    [id number] Date of Birth: January 31, 1956      Patient Gender: M Patient Age:   57 years Exam Location:  [facility] Procedure:      VAS US CAROTID Referring Phys: Alferd Patee [facility] --------------------------------------------------------------------------------  Indications:       CVA. Risk Factors:      Hypertension, hyperlipidemia, past history of smoking, PAD. Other Factors:     ESRD on dialysis. Limitations        Today's exam was limited due to Patient condition.                    Somnolence. and heavy calcification and the resulting                    shadowing. Comparison Study:  Prior carotid duplex done 10/02/19 Performing Technologist: Sharion Dove RVS  Examination Guidelines: A complete evaluation includes B-mode imaging, spectral Doppler, color Doppler, and power Doppler as needed of all accessible portions of each vessel. Bilateral testing is considered an integral part of a complete examination. Limited examinations for reoccurring indications may be performed as  noted.  Right Carotid Findings: +----------+--------+--------+--------+----------------------+--------+           PSV cm/sEDV cm/sStenosisPlaque Description    Comments +----------+--------+--------+--------+----------------------+--------+ CCA Prox  112     17              heterogenous                   +----------+--------+--------+--------+----------------------+--------+ CCA Distal122     14              heterogenous                   +----------+--------+--------+--------+----------------------+--------+ ICA Prox  215     29      40-59%  calcific and irregulartortuous +----------+--------+--------+--------+----------------------+--------+ ICA Mid   126     22              calcific                       +----------+--------+--------+--------+----------------------+--------+ ICA Distal154     26                                             +----------+--------+--------+--------+----------------------+--------+ ECA       47      3                                              +----------+--------+--------+--------+----------------------+--------+ +----------+--------+-------+------------+-------------------+           PSV cm/sEDV cmsDescribe  Arm Pressure (mmHG) +----------+--------+-------+------------+-------------------+ Subclavian               Not assessed                    +----------+--------+-------+------------+-------------------+ +---------+--------+--------+------------+ VertebralPSV cm/sEDV cm/sNot assessed +---------+--------+--------+------------+  Left Carotid Findings: +----------+--------+--------+--------+----------------------+--------+           PSV cm/sEDV cm/sStenosisPlaque Description    Comments +----------+--------+--------+--------+----------------------+--------+ CCA Prox  99      13              calcific                       +----------+--------+--------+--------+----------------------+--------+ CCA Mid    117     13              calcific                       +----------+--------+--------+--------+----------------------+--------+ CCA Distal111     12              calcific                       +----------+--------+--------+--------+----------------------+--------+ ICA Prox  199     24      40-59%  calcific and irregular         +----------+--------+--------+--------+----------------------+--------+ ICA Mid   194     28                                             +----------+--------+--------+--------+----------------------+--------+ ICA Distal69      11                                             +----------+--------+--------+--------+----------------------+--------+ ECA       271     16              calcific                       +----------+--------+--------+--------+----------------------+--------+ +----------+--------+--------+--------+-------------------+           PSV cm/sEDV cm/sDescribeArm Pressure (mmHG) +----------+--------+--------+--------+-------------------+ [id number]                                         +----------+--------+--------+--------+-------------------+ +---------+--------+--+--------+--+---------+ VertebralPSV cm/s83EDV cm/s16Antegrade +---------+--------+--+--------+--+---------+   Summary: Right Carotid: Velocities in the right ICA are consistent with a 40-59%                stenosis, no significant change since prior study. Left Carotid: Velocities in the left ICA are consistent with a 40-59% stenosis,               no significant change since prior study. Vertebrals:  Left vertebral artery demonstrates antegrade flow. Subclavians: Normal flow hemodynamics were seen in the left subclavian artery. *See table(s) above for measurements and observations.     Preliminary    CT ABDOMEN PELVIS WO CONTRAST  Result Date: 09/05/2022 CLINICAL DATA:  Rule out retroperitoneal bleed. EXAM: CT ABDOMEN AND PELVIS WITHOUT CONTRAST  TECHNIQUE: Multidetector CT imaging of the abdomen and pelvis was performed following  the standard protocol without IV contrast. RADIATION DOSE REDUCTION: This exam was performed according to the departmental dose-optimization program which includes automated exposure control, adjustment of the mA and/or kV according to patient size and/or use of iterative reconstruction technique. COMPARISON:  08/12/2022 FINDINGS: Lower chest: Emphysema. Development of areas of nodular airspace disease within both lower lobes since 08/12/2022. Mild cardiomegaly. Distal right coronary artery calcification. Hypoattenuation in the intravascular space suggests anemia. Trace left pleural fluid is similar. Hepatobiliary: Normal liver. Normal gallbladder, without biliary ductal dilatation. Pancreas: Soft tissue density within the pancreatic tail of 2.1 cm in 23/3 is most consistent with a splenule, when correlated with contrast enhanced exam. No duct dilatation or acute inflammation. Spleen: Otherwise normal spleen. Adrenals/Urinary Tract: Normal adrenal glands. Marked renal cortical thinning bilaterally. Renal vascular calcifications. No hydronephrosis. The bladder is decompressed, without calcified stone. Stomach/Bowel: Normal stomach, without wall thickening. Scattered colonic diverticula. Normal terminal ileum and appendix. Normal small bowel. Vascular/Lymphatic: Advanced aortic and branch vessel atherosclerosis. Bilateral iliac stent grafts. No abdominopelvic adenopathy. Reproductive: Normal prostate. Other: No free intraperitoneal air. No intraperitoneal fluid identified. Hemorrhage within the anterior extraperitoneal pelvic prevesical space at maximally 10.3 x 6.3 cm on 35/3. This is contiguous with right pelvic extraperitoneal hematoma at 4.7 x 3.3 cm on 71/3. Increase in left pelvic complex fluid which is also presumably hemorrhage on 68/3. There is adjacent gas as well as gas and fluid in the left groin, new on 75/3-likely  iatrogenic. Musculoskeletal: Posterolateral right ninth rib nonacute but incompletely healed fracture. Transitional S1 vertebral body. IMPRESSION: 1. Anterior pelvic extraperitoneal hematoma within the prevesical space. Right-greater-than-left extension of hemorrhage within the extraperitoneal lateral pelvic walls. 2. Trace left pleural fluid. 3. Development of nodular airspace disease since 08/12/2022, favoring infection or aspiration. 4. Coronary artery atherosclerosis. Aortic Atherosclerosis (ICD10-I70.0). Emphysema (ICD10-J43.9). These results will be called to the ordering clinician or representative by the Radiologist Assistant, and communication documented in the PACS or [employer]. Electronically Signed   By: Abigail Miyamoto M.D.   On: 09/05/2022 13:25   MR CERVICAL SPINE WO CONTRAST  Result Date: 09/05/2022 CLINICAL DATA:  Myelopathy, acute, cervical spine. EXAM: MRI CERVICAL SPINE WITHOUT CONTRAST TECHNIQUE: Multiplanar, multisequence MR imaging of the cervical spine was performed. No intravenous contrast was administered. COMPARISON:  Radiographs 05/11/2022, CT 08/07/2021 and MRI of the cervical spine 06/25/2022. FINDINGS: Despite efforts by the technologist and patient, mild to moderate motion artifact is present on today's exam and could not be eliminated. This reduces exam sensitivity and specificity. Alignment: Straightening without focal angulation or listhesis. Vertebrae: Interval improvement in the previously demonstrated abnormalities of the C5 and C6 vertebral bodies and intervening C5-6 disc. There is less marrow edema and less surrounding inflammatory change. No new disc space findings or acute osseous abnormalities. Cord: Stable cord flattening and possible T2 hyperintensity in the cord at the C3-4 and C5-6 levels. No evidence of cord hemorrhage. Posterior Fossa, vertebral arteries, paraspinal tissues: Intracranial findings are dictated separatelybilateral vertebral artery flow voids.  The prevertebral inflammatory changes seen previously have significantly improved in the interval. No paraspinal fluid collection identified. Oval T2 hyperintensity within the left mandible was not previously imaged and could reflect osteomyelitis. Disc levels: C2-3: No significant findings. C3-4: Stable spondylosis with loss of disc height and posterior osteophytes covering diffusely bulging disc material. There is uncinate spurring and facet hypertrophy bilaterally contributing to stable mild cord flattening and foraminal narrowing bilaterally. There is possible mild cord T2 hyperintensity, grossly stable. C4-5: Mild spondylosis with  mild disc bulging and facet hypertrophy. No cord deformity or high-grade foraminal narrowing. C5-6: Chronic spondylosis with loss of disc height and posterior osteophytes covering diffusely bulging disc material. The AP diameter of the canal is narrowed to approximately 5 mm with unchanged chronic cord compression. Moderate foraminal narrowing bilaterally. As above, the previously demonstrated paraspinal inflammatory changes and marrow edema at this level have improved. C6-7: Stable spondylosis with posterior osteophytes covering diffusely bulging disc material. Mild spinal stenosis and mild foraminal narrowing bilaterally. C7-T1: Stable mild disc bulging and mild uncinate spurring bilaterally. No cord deformity. Mild foraminal narrowing bilaterally. T1-2: Normal interspace. IMPRESSION: 1. Compared with previous MRI from 06/25/2022, there has been significant improvement in the previously demonstrated paraspinal inflammatory changes and marrow edema at the C5-6 level, suggesting improving discitis/osteomyelitis. Correlate clinically. No residual paraspinal fluid collection identified. 2. Similar multilevel cervical spondylosis, most advanced at C5-6 where there is chronic cord compression and moderate foraminal narrowing bilaterally. Possible mild cord T2 hyperintensity at C3-4 and  C5-6. 3. T2 hyperintensity in the left body of the mandible, not previously imaged and potentially reflecting osteomyelitis. Correlate clinically. 4. No acute findings identified. Electronically Signed   By: Richardean Sale M.D.   On: 09/05/2022 12:37   MR ANGIO HEAD WO CONTRAST  Result Date: 09/05/2022 CLINICAL DATA:  Acute cervical myelopathy EXAM: MRA HEAD WITHOUT CONTRAST TECHNIQUE: Angiographic images of the Circle of Willis were acquired using MRA technique without intravenous contrast. COMPARISON:  Brain MRI from yesterday FINDINGS: Pervasive motion artifact. The left vertebral artery is strongly dominant with thready flow in the right vertebral artery accentuated by motion. No branch occlusion, beading, or aneurysm. No flow limiting stenosis. IMPRESSION: Motion degraded MRA without acute finding. Irregular appearance of the right vertebral artery partially related to motion. Electronically Signed   By: Jorje Guild M.D.   On: 09/05/2022 11:54   MR LUMBAR SPINE WO CONTRAST  Result Date: 09/04/2022 CLINICAL DATA:  LLE EXAM: MRI LUMBAR SPINE WITHOUT CONTRAST TECHNIQUE: Multiplanar, multisequence MR imaging of the lumbar spine was performed. No intravenous contrast was administered. COMPARISON:  None Available. FINDINGS: Limited due to motion/artifact. Segmentation: Standard segmentation is assumed. The inferior-most fully formed intervertebral disc is labeled L5-S1. Alignment:  No substantial sagittal subluxation. Vertebrae: Heterogeneous bone marrow. No focal edema to suggest acute fracture or discitis/osteomyelitis. No focal suspicious bone lesion. Conus medullaris and cauda equina: Conus extends to the superior L2 level. Conus appears normal. Paraspinal and other soft tissues: Limited visualization without obvious acute abnormality. Disc levels: T12-L1: No significant disc protrusion, foraminal stenosis, or canal stenosis. L1-L2: No significant disc protrusion, foraminal stenosis, or canal  stenosis. L2-L3: No significant disc protrusion, foraminal stenosis, or canal stenosis. L3-L4: Mild disc bulging and facet arthropathy. No significant stenosis. L4-L5: No significant disc protrusion, foraminal stenosis, or canal stenosis. L5-S1: Facet arthropathy. Small left paracentral disc protrusion which comes in close proximity to the exiting left L5 nerve without impingement. No significant stenosis. IMPRESSION: 1. Motion limited study without evidence of significant canal or foraminal stenosis. 2. At L5-S1, small left paracentral disc protrusion which comes in close proximity to the exiting left L5 nerve without impingement. Electronically Signed   By: Margaretha Sheffield M.D.   On: 09/04/2022 12:19   MR BRAIN WO CONTRAST  Result Date: 09/04/2022 CLINICAL DATA:  Provided history: Altered mental status, left lower extremity weakness. EXAM: MRI HEAD WITHOUT CONTRAST TECHNIQUE: Multiplanar, multiecho pulse sequences of the brain and surrounding structures were obtained without intravenous contrast. COMPARISON:  Head  CT 08/30/2022. FINDINGS: Intermittently motion degraded examination, limiting evaluation. Most notably, the sagittal T1 sequence is moderately motion degraded and the least motion degraded axial T2 sequence is moderately motion degraded. Within this limitation, findings are as follows. Brain: Moderate generalized cerebral atrophy. Mild cerebellar atrophy. 4 mm acute infarct within the right subinsular white matter (series 2, images 22 and 23) (series 3, image 19). Redemonstrated chronic lacunar infarct within the right basal ganglia. Background multifocal T2 FLAIR hyperintense signal abnormality within the cerebral white matter, nonspecific but compatible with mild chronic small vessel ischemic disease. These findings are similar to the prior brain MRI of 05/09/2021. As before, there are a few chronic microhemorrhages scattered within the supratentorial brain. Redemonstrated tiny chronic lacunar  infarct within the central pons, and small chronic infarcts within the left cerebellar hemisphere. No evidence of an intracranial mass. No extra-axial fluid collection. No midline shift. Vascular: As before, there is signal abnormality within the intracranial right vertebral artery suggesting high-grade stenosis or vessel occlusion. Skull and upper cervical spine: No focal suspicious marrow lesion. Incompletely assessed cervical spondylosis. Sinuses/Orbits: No mass or acute finding within the imaged orbits. No more than trace paranasal sinus mucosal thickening. IMpression #2 will be called to the ordering clinician or representative by the Radiologist Assistant, and communication documented in the PACS or [employer]. IMPRESSION: 1. Motion degraded examination. 2. 4 mm acute infarct within the right subinsular white matter. 3. Parenchymal atrophy, chronic small vessel ischemic disease and chronic infarcts as described. 4. Few chronic microhemorrhages within the supratentorial brain. 5. Chronic signal abnormality within the intracranial right vertebral artery suggesting high-grade stenosis or vessel occlusion. Electronically Signed   By: Kellie Simmering D.O.   On: 09/04/2022 12:01   VAS [place] LOWER EXTREMITY SAPHENOUS VEIN MAPPING  Result Date: 08/31/2022 LOWER EXTREMITY VEIN MAPPING Patient Name:  Albert Harris  Date of Exam:   08/31/2022 Medical Rec #: [id number]       Accession #:    [id number] Date of Birth: February 11, 1956      Patient Gender: M Patient Age:   70 years Exam Location:  [facility] Procedure:      VAS [place] LOWER EXTREMITY SAPHENOUS VEIN MAPPING Referring Phys: Vonna Kotyk ROBINS --------------------------------------------------------------------------------  Indications:  Pre-op Risk Factors: PAD.  Limitations: Right Fem-pop bypass graft with saphenous vein 09/05/21 Comparison Study: Prior vein mapping done 09/18/20 Performing Technologist: Sharion Dove RVS  Examination Guidelines: A complete  evaluation includes B-mode imaging, spectral Doppler, color Doppler, and power Doppler as needed of all accessible portions of each vessel. Bilateral testing is considered an integral part of a complete examination. Limited examinations for reoccurring indications may be performed as noted. +----------+--------------+-----------------------+----------------+-----------+     RT     RT Findings            GSV          LT Diameter (cm)LT Findings  Diameter                                                                     (cm)                                                                    +----------+--------------+-----------------------+----------------+-----------+  Harvested   Saphenofemoral Junction      0.67                  +----------+--------------+-----------------------+----------------+-----------+             Harvested       Proximal thigh           0.48                  +----------+--------------+-----------------------+----------------+-----------+             Harvested          Mid thigh             0.40                  +----------+--------------+-----------------------+----------------+-----------+             Harvested        Distal thigh         0.44/0.31     branching  +----------+--------------+-----------------------+----------------+-----------+           not visualized         Knee                0.22                  +----------+--------------+-----------------------+----------------+-----------+           not visualized       Prox calf             0.21                  +----------+--------------+-----------------------+----------------+-----------+           not visualized       Mid calf            0.24/1.8     branching  +----------+--------------+-----------------------+----------------+-----------+           not visualized      Distal calf         0.360/.09     branching   +----------+--------------+-----------------------+----------------+-----------+           not visualized         Ankle            0.15/0.12     branching  +----------+--------------+-----------------------+----------------+-----------+ Diagnosing physician: [name] MD Electronically signed by [name] MD on 08/31/2022 at 6:30:54 PM.    Final    VAS [place] (AVF, AVG)  Result Date: 08/31/2022 DIALYSIS ACCESS Patient Name:  Albert Harris  Date of Exam:   08/31/2022 Medical Rec #: [id number]       Accession #:    [id number] Date of Birth: 1955/07/15      Patient Gender: M Patient Age:   67 years Exam Location:  [facility] Procedure:      VAS US DUPLEX DIALYSIS ACCESS (AVF, AVG) Referring Phys: Santiago Bumpers --------------------------------------------------------------------------------  Reason for Exam: No palpable thrill for AVF/AVG. Access Site: Right Upper Extremity. Access Type: Forearm loop AVG. Comparison Study: No prior study Performing Technologist: Sharion Dove RVS  Examination Guidelines: A complete evaluation includes B-mode imaging, spectral Doppler, color Doppler, and power Doppler as needed of all accessible portions of each vessel. Unilateral testing is considered an integral part of a complete examination. Limited examinations for reoccurring indications may be performed as noted.  Findings:   Thrombosed AV graft  Summary: Thrombosed right AV graft  *See table(s) above for measurements and observations.  Diagnosing physician: Harrell Gave  Scot Dock MD Electronically signed by [name] MD on 08/31/2022 at 6:30:35 PM.    --------------------------------------------------------------------------------   Final    ECHOCARDIOGRAM LIMITED  Result Date: 08/31/2022    ECHOCARDIOGRAM LIMITED REPORT   Patient Name:   Albert Harris Date of Exam: 08/31/2022 Medical Rec #:  [id number]      Height:       68.0 in Accession #:    [id number]      Weight:       144.4 lb Date of Birth:  1956/04/22     BSA:          1.780 m Patient Age:    56 years       BP:           165/52 mmHg Patient Gender: M              HR:           79 bpm. Exam Location:  Inpatient Procedure: Limited Echo, Cardiac Doppler and Limited Color Doppler Indications:    Bacteremia R78.81  History:        Patient has prior history of Echocardiogram examinations, most                 recent 06/29/2022. 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  Sonographer:    Greer Pickerel Referring Phys: Toney Sang SOOD  Sonographer Comments: Image acquisition challenging due to respiratory motion. IMPRESSIONS  1. Left ventricular ejection fraction, by estimation, is 45%. There is moderate to severe concentric left ventricular hypertrophy. Diastolic function was not assessed.  2. Right ventricular systolic function is normal.  3. The mitral valve is grossly normal with mild thickening of the mitral valve leaflets. Mild mitral valve regurgitation.  4. Right atrial size was mildly dilated.  5. Left atrial size was mildly dilated.  6. The inferior vena cava is normal in size with greater than 50% respiratory variability, suggesting right atrial pressure of 3 mmHg.  7. The aortic valve was not well visualized. There is mild calcification of the aortic valve. There is mild thickening of the aortic valve. Aortic valve regurgitation is not visualized. FINDINGS  Left Ventricle: Left ventricular ejection fraction, by estimation, is 45 to 50%. The left ventricle has mildly decreased function. There is severe concentric left ventricular hypertrophy. Left ventricular diastolic function could not be evaluated. Right Ventricle: No increase in right ventricular wall thickness. Right ventricular systolic function is normal. Left Atrium: Left atrial size was mildly dilated. Right  Atrium: Right atrial size was mildly dilated. Pericardium: There is no evidence of pericardial effusion. Mitral Valve: The mitral valve is grossly normal. There is mild thickening of the mitral valve leaflet(s). Mild mitral annular calcification. Mild mitral valve regurgitation. Tricuspid Valve: The tricuspid valve is normal in structure. Tricuspid valve regurgitation is mild. Aortic Valve: The aortic valve was not well visualized. There is mild calcification of the aortic valve. There is mild thickening of the aortic valve. Aortic valve regurgitation is not visualized. Pulmonic Valve: The pulmonic valve was not assessed. Venous: The inferior vena cava is normal in size with greater than 50% respiratory variability, suggesting right atrial pressure of 3 mmHg. Additional Comments: Spectral Doppler performed. Color Doppler performed.  LEFT VENTRICLE PLAX 2D LVIDd:         4.00 cm LVIDs:         3.00 cm LV PW:         1.60 cm LV IVS:        1.10 cm  LEFT ATRIUM         Index LA diam:    2.80 cm 1.57 cm/m  MITRAL VALVE MV Area (PHT): 2.73 cm MV Decel Time: 278 msec MV E velocity: 72.80 cm/s MV A velocity: 99.00 cm/s MV E/A ratio:  0.74 Aditya Sabharwal Electronically signed by Hebert Soho Signature Date/Time: 08/31/2022/2:22:32 PM    Final    CT HEAD WO CONTRAST (5MM)  Result Date: 08/30/2022 CLINICAL DATA:  Initial evaluation for mental status change. EXAM: CT HEAD WITHOUT CONTRAST TECHNIQUE: Contiguous axial images were obtained from the base of the skull through the vertex without intravenous contrast. RADIATION DOSE REDUCTION: This exam was performed according to the departmental dose-optimization program which includes automated exposure control, adjustment of the mA and/or kV according to patient size and/or use of iterative reconstruction technique. COMPARISON:  Prior CT from 08/07/2021. FINDINGS: Brain: Age-related cerebral atrophy with mild chronic small vessel ischemic disease. Chronic right basal  ganglia lacunar infarct. No acute intracranial hemorrhage. No acute large vessel territory infarct. No mass lesion or midline shift. No hydrocephalus or extra-axial fluid collection. Vascular: No abnormal hyperdense vessel. Scattered calcified atherosclerosis about the skull base, stable. Skull: Scalp soft tissues and calvarium demonstrate no acute finding. Sinuses/Orbits: Globes and orbital soft tissues within normal limits. Paranasal sinuses are clear. Trace right mastoid effusion noted, of doubtful significance. Other: None. IMPRESSION: 1. No acute intracranial abnormality. 2. Age-related cerebral atrophy with mild chronic small vessel ischemic disease, with small remote right basal ganglia lacunar infarct, stable. Electronically Signed   By: Jeannine Boga M.D.   On: 08/30/2022 19:21   DG Abd Portable 1V  Result Date: 08/29/2022 CLINICAL DATA:  Orogastric tube placement EXAM: PORTABLE ABDOMEN - 1 VIEW COMPARISON:  Portable exam 1228 hours compared to 1040 hours FINDINGS: Tip of nasogastric tube projects over distal gastric antrum/pylorus. Lung bases clear. Nonobstructive bowel gas pattern. IMPRESSION: Tip of nasogastric tube projects over distal gastric antrum/pylorus. Electronically Signed   By: Lavonia Dana M.D.   On: 08/29/2022 12:45   DG Abd Portable 1V  Result Date: 08/29/2022 CLINICAL DATA:  Orogastric tube placement EXAM: PORTABLE ABDOMEN - 1 VIEW COMPARISON:  08/12/2022 CT abdomen FINDINGS: The orogastric tube distal tip is in the stomach body with side port in the gastric fundus. Borderline enlargement of the cardiopericardial silhouette. Formed stool in the visualized colon. Mild lumbar spondylosis. Enlargement of the cardiopericardial silhouette IMPRESSION: 1. Orogastric tube tip is in the stomach body with side port in the gastric fundus. 2. Borderline enlargement of the cardiopericardial silhouette. Electronically Signed   By: Van Clines M.D.   On: 08/29/2022 10:52   DG Chest  Portable 1 View  Result Date: 08/28/2022 CLINICAL DATA:  Central line placement EXAM: PORTABLE CHEST 1 VIEW COMPARISON:  08/12/2022 FINDINGS: Right IJ line with tip at the upper SVC. Artifact from EKG leads. Nipple shadows asymmetric to the left, confirmed on recent abdominal CT covering the lung bases. There is no edema, consolidation, effusion, or pneumothorax. Normal heart size. IMPRESSION: Right IJ line without complicating feature. Electronically Signed   By: Jorje Guild M.D.   On: 08/28/2022 21:41   CT Angio Aortobifemoral W and/or Wo Contrast  Result Date: 08/28/2022  CLINICAL DATA:  Claudication or leg ischemia EXAM: CT ANGIOGRAPHY OF ABDOMINAL AORTA WITH ILIOFEMORAL RUNOFF TECHNIQUE: Multidetector CT imaging of the abdomen, pelvis and lower extremities was performed using the standard protocol during bolus administration of intravenous contrast. Multiplanar CT image reconstructions and MIPs were obtained to evaluate the vascular anatomy. RADIATION DOSE REDUCTION: This exam was performed according to the departmental dose-optimization program which includes automated exposure control, adjustment of the mA and/or kV according to patient size and/or use of iterative reconstruction technique. CONTRAST:  3mL OMNIPAQUE IOHEXOL 350 MG/ML SOLN COMPARISON:  CT 08/12/2022 and previous FINDINGS: VASCULAR Aorta: Extensive partially calcified atheromatous plaque throughout. No aneurysm, dissection, or stenosis. 4 mm penetrating atheromatous ulcer along the left posterolateral wall in the infrarenal segment. Celiac: Calcified ostial plaque resulting in short segment stenosis of moderate severity, atheromatous but patent distally. SMA: Significant partially calcified atheromatous plaque extending from the ostium approximally 2.4 cm, with progression to at least moderately severe stenosis since 01/01/2022, with continued patency distally with classic distal branch anatomy. Renals: Single left, with near occlusive  ostial plaque extending over length of at least 1.2 cm, patent but atheromatous distally. Single right, with heavily calcified atheromatous plaque through its length , of likely hemodynamic significance. IMA: Atheromatous but patent. RIGHT Lower Extremity Inflow: Common iliac proximal stent patent, distal stent with mild residual/recurrent narrowing at its proximal end, patent across the bifurcation. Internal iliac occluded at its origin External iliac overlapping stents through nearly its length, with moderate residual/recurrent stenosis at the distal margin at the common femoral artery. Outflow: Common femoral is mildly atheromatous but patent distally. Deep femoral branches patent proximally, segmentally occluded distally. SFA proximal occlusion extending throughz its length. Patent fem-pop graft, with some retrograde filling of the proximal native popliteal artery. Runoff: Venous opacification in the proximal calf limited evaluation of proximal tibial runoff. Scattered atheromatous calcifications and poor enhancement in the distal calf limiting assessment of distal trifurcation runoff. LEFT Lower Extremity Inflow: Common iliac patent stents through its length Internal iliac origin occlusion External iliac patent stent through its length. Outflow: Common femoral has 1.1 cm thin-necked pseudoaneurysm from its proximal segment, patent distally. Deep femoral branches patent SFA origin occlusion obtaining throughout its length despite presence of stents. Popliteal is reconstituted proximally by collaterals, patent but moderately atheromatous through its distal length. Runoff: Atheromatous but apparently contiguous three-vessel tibial runoff to above the ankle, with extensive vascular calcifications and limited enhancement limiting assessment across the ankle and into the foot. Veins: No obvious venous abnormality within the limitations of this arterial phase study. Review of the MIP images confirms the above  findings. NON-VASCULAR Lower chest: No pleural or pericardial effusion. Visualized lung bases clear. Hepatobiliary: No focal liver abnormality is seen. No gallstones, gallbladder wall thickening, or biliary dilatation. Pancreas: Unremarkable. No pancreatic ductal dilatation or surrounding inflammatory changes. Spleen: Normal in size without focal abnormality. 2 cm probable splenule abutting the pancreatic tail, stable since earliest available scan 06/18/2020. Adrenals/Urinary Tract: No adrenal mass. Marked bilateral renal parenchymal atrophy with extensive renal arterial branch calcifications. No hydronephrosis or mass. Urinary bladder is nondistended. Stomach/Bowel: Stomach is partially distended by gas and fluid, no acute findings. Small bowel is decompressed. Normal appendix. The colon is incompletely distended, with scattered diverticula at the descending/sigmoid junction, no adjacent inflammatory changes. Lymphatic: No abdominal or pelvic adenopathy. Reproductive: Mild prostate enlargement. Other: Subcutaneous gas bubbles overlying left common femoral vessels. There is a deep subcutaneous hematoma overlying the left common femoral vessels with extension into the anterior  extraperitoneal space of the pelvis, dominant component measuring 11.6 x 6.3 cm,, and an 8.3 cm right-sided component extending along the pelvic sidewall. No active extravasation is identified. No ascites or hemoperitoneum. No free air. Left pelvic phleboliths. Musculoskeletal: No acute findings. IMPRESSION: 1. 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 2. New anterior pelvic extraperitoneal hematoma measuring up to 11.6 cm diameter. No active extravasation evident. 3. Long segment LEFT SFA occlusion. Critical Value/emergent results were discussed by telephone at the time of interpretation on 08/28/2022 at 7:35 pm with provider [facility] , who verbally acknowledged these results. 4. Residual/recurrent stenosis at  the distal margin of the RIGHT external iliac stent. 5. Patent RIGHT fem-pop graft 6. Patent LEFT iliac stents. 7. 4 mm penetrating atheromatous ulcer in the infrarenal abdominal aorta. 8. Colonic diverticulosis. 9. Aortic Atherosclerosis (ICD10-I70.0). Electronically Signed   By: Lucrezia Europe M.D.   On: 08/28/2022 19:40   HYBRID OR IMAGING ([facility] ONLY)  Result Date: 08/22/2022 There is no interpretation for this exam.  This order is for images obtained during a surgical procedure.  Please See "Surgeries" Tab for more information regarding the procedure.   CT ABDOMEN PELVIS W CONTRAST  Result Date: 08/12/2022 CLINICAL DATA:  Abdominal pain, nausea, vomiting, diarrhea, recent diagnosis of C. difficile EXAM: CT ABDOMEN AND PELVIS WITH CONTRAST TECHNIQUE: Multidetector CT imaging of the abdomen and pelvis was performed using the standard protocol following bolus administration of intravenous contrast. RADIATION DOSE REDUCTION: This exam was performed according to the departmental dose-optimization program which includes automated exposure control, adjustment of the mA and/or kV according to patient size and/or use of iterative reconstruction technique. CONTRAST:  26mL OMNIPAQUE IOHEXOL 350 MG/ML SOLN COMPARISON:  01/01/2022 FINDINGS: Lower chest: Cardiomegaly.  Trace bilateral pleural effusions. Hepatobiliary: No focal liver abnormality is seen. Hyperattenuating fluid within the gallbladder suggesting vicarious excretion of contrast. No pericholecystic inflammatory changes by CT. No biliary dilatation. Pancreas: Splenule is again noted abutting the distal pancreatic tail. Pancreas appears otherwise unremarkable. No pancreatic ductal dilatation or surrounding inflammatory changes. Spleen: Normal in size without focal abnormality. Adrenals/Urinary Tract: Adrenal glands are unremarkable. Kidneys are normal, without renal calculi, focal lesion, or hydronephrosis. Bladder is decompressed. Stomach/Bowel: Stomach within  normal limits. No dilated loops of bowel. Long segment mild circumferential wall thickening of the sigmoid colon, and to a lesser degree involving the descending colon. There are numerous left-sided colonic diverticula, although no focally inflamed diverticulum is seen. A normal appendix is seen in the right lower quadrant. Vascular/Lymphatic: Severe aortoiliac atherosclerosis without aneurysm. No abdominopelvic lymphadenopathy. Reproductive: Prostate is unremarkable. Other: Trace perihepatic ascites. Small volume retroperitoneal fluid within the anterolateral aspect of the left hemipelvis measuring greater than fluid density. Approximate measurements of 6.5 x 1.8 x 5.1 cm (volume = 31 cm^3). No pneumoperitoneum. Musculoskeletal: No acute or significant osseous findings. IMPRESSION: 1. Small volume retroperitoneal fluid within the left hemipelvis measuring greater than fluid density, suggestive of a retroperitoneal hematoma of indeterminate etiology. 2. Long segment mild circumferential wall thickening of the sigmoid colon, and to a lesser degree involving the descending colon. Findings are suggestive of an infectious or inflammatory colitis. 3. Trace perihepatic ascites. 4. Trace bilateral pleural effusions. 5. Severe aortoiliac atherosclerosis (ICD10-I70.0). Electronically Signed   By: Davina Poke D.O.   On: 08/12/2022 16:41   DG Chest Port 1 View  Result Date: 08/12/2022 CLINICAL DATA:  Shortness of breath EXAM: PORTABLE CHEST 1 VIEW COMPARISON:  CXR 07/21/22 FINDINGS: No pleural effusion. No pneumothorax.  Unchanged cardiomegaly. Unchanged mediastinal contours. Focal airspace opacity. Redemonstrated are prominent bilateral interstitial opacities, which could represent pulmonary venous congestion or pulmonary edema. No radiographically apparent displaced rib fractures. Visualized upper abdomen is unremarkable. IMPRESSION: 1. Unchanged cardiomegaly with prominent bilateral interstitial opacities, which could  represent pulmonary venous congestion or pulmonary edema. 2. No focal airspace opacity. Electronically Signed   By: Marin Roberts M.D.   On: 08/12/2022 14:41   PERIPHERAL VASCULAR CATHETERIZATION  Result Date: 08/10/2022 Table formatting from the original result was not included. Images from the original result were not included. PATIENT: Albert Harris      MRN: [id number] DOB: 01/30/1956    DATE OF PROCEDURE: 08/10/2022 INDICATIONS:  RYATT CARODINE is a 67 y.o. male who presented with rest pain of his left foot.  He has undergone a previous right iliofemoral endarterectomy and a right femoropopliteal bypass.  He is also had bilateral iliac stents. PROCEDURE:  Conscious sedation Ultrasound-guided access to the left common femoral artery Aortogram with left lower extremity runoff Angioplasty and stenting of the left external iliac artery (7 mm x 5 cm Viabahn stent x 2; postdilatation with a 6 mm balloon) SURGEON: Judeth Cornfield. Scot Dock, MD, FACS ANESTHESIA: Local with sedation EBL: Minimal TECHNIQUE: The patient was brought to the peripheral vascular lab and was sedated. The period of conscious sedation was 105 minutes.  During that time period, I was present face-to-face 100% of the time.  The patient was administered initially 1 mg of Versed and 50 mcg of fentanyl. [name] heart rate, blood pressure, and oxygen saturation were monitored by the nurse continuously during the procedure. Both groins were prepped and draped in the usual sterile fashion.  Under ultrasound guidance, after the skin was anesthetized, I cannulated the left common femoral artery with a micropuncture needle and a micropuncture sheath was introduced over a wire.  By ultrasound the femoral artery was patent.  There was significant calcific disease in the common femoral artery.  A real-time image was obtained and sent to the server.This was exchanged for a 5 [place] sheath over a Bentson wire.  It was difficult to pass the wire and  ultimately the wire and sheath came out.  I held pressure for 10 minutes.  There was no evidence of hematoma.  I then under ultrasound guidance again accessed the left common femoral artery with a micropuncture needle.  This time I used the Nitrex wire and was able to navigate into the common iliac artery and advanced the micropuncture sheath over the wire.  I then used an angled Glidewire to get up into the infrarenal aorta.  I exchanged this for a Rosen wire over a straight catheter.  I then placed a 5 French sheath in the left groin.  The pigtail catheter was positioned at the L1 vertebral body and flush aortogram obtained.  The cath was positioned above the aortic bifurcation and an oblique iliac projection was obtained.  The patient had significant disease in the external iliac artery on the left and I elected to address this with angioplasty and stenting.  I attempted to advance a 7 [place] destination sheath over the [facility] wire but met significant resistance.  Therefore the patient was heparinized.  ACT was monitored throughout the procedure.  I dilated the area of concern in the external iliac artery with a 5 mm x 4 cm balloon.  At this point I was able to advance the 7 French sheath.  I then exchanged wires for a 0.018  wire.  I selected a 7 x 50 Viabahn stent.  This was positioned below the takeoff of the hypogastric artery which was occluded.  This was deployed without difficulty and postdilatation was done with a 6 mm balloon.  There was an area of concern the distal stent and I elected to cover this.  A second 7 mm x 50 mm Viabahn stent was overlapped to cover the area of concern and this was deployed without difficulty.  Postdilatation was done with a 6 mm balloon.  There was an excellent result with no residual stenosis. Runoff film was then obtained on the left.  At the at the completion we exchanged the long 7 French sheath for a short 7 [place] sheath which was easily advanced into the iliac system on  the left at this point. FINDINGS: The infrarenal aorta is patent with no significant stenosis identified.  The renal arteries are patent with no significant renal artery stenosis identified. On the right side the common iliac and external iliac arteries are patent. On the left side the common iliac artery is patent and it has previously been stented.  There was diffuse disease in the external iliac artery with up to an 80% stenosis.  This was successfully addressed with angioplasty and stenting as described above with no residual stenosis. Low that the common femoral had calcific disease but was patent.  The deep femoral artery is patent.  The superficial femoral artery is occluded at its origin with reconstitution of the above-knee popliteal artery.  The patient has diffuse tibial disease.  The dominant runoff is the peroneal artery which occludes above the foot.  The anterior tibial artery has diffuse disease proximally and then occludes in the mid leg.  There is reconstitution of the distal anterior tibial and dorsalis pedis artery on the left.  The posterior tibial artery is occluded and reconstitutes above the ankle with moderate disease. CLINICAL NOTE: Hopefully by addressing the inflow this will help with his rest pain.  Otherwise we would have to consider infrainguinal bypass.  He has diffuse calcific disease in the common femoral artery and the stent extends very low.  I would like to avoid bypass if possible.  However when he returns for a follow-up visit we will obtain vein mapping.  He has not been taking aspirin or a statin.  He was started on 81 mg of aspirin, 75 mg of Plavix daily, and 10 mg of Crestor daily.  This was sent to his pharmacy. WIfI 0 1 0 Clinical stage I [name], MD, FACS [facility]     PHYSICAL EXAM  Temp:  [97.7 F (36.5 C)-99.3 F (37.4 C)] 99.1 F (37.3 C) (03/31 0700) Pulse Rate:  [65-87] 86 (03/31 1000) Resp:  [15-35] 28 (03/31  1000) BP: (80-168)/(43-96) 168/74 (03/31 1000) SpO2:  [83 %-100 %] 96 % (03/31 1000) Weight:  [60.2 kg] 60.2 kg (03/31 0500)  General - Well nourished, well developed, in no apparent distress.  Ophthalmologic - fundi not visualized due to noncooperation.  Cardiovascular - Regular rhythm and rate.  Mental Status -  Level of arousal and orientation to time, place, and person were intact. Language including expression, naming, repetition, comprehension was assessed and found intact. Fund of Knowledge was assessed and was intact.  Cranial Nerves II - XII - II - Visual field intact OU. III, IV, VI - Extraocular movements intact. V - Facial sensation intact bilaterally. VII - Facial movement intact bilaterally. VIII - Hearing &  vestibular intact bilaterally. X - Palate elevates symmetrically. XI - Chin turning & shoulder shrug intact bilaterally. XII - Tongue protrusion intact.  Motor Strength - [name] strength was normal in b/l upper extremities and pronator drift was absent.  LLE 2/5 proximal and distal, and RLE 3/5 proximal and 3+/5 distally. Bulk was normal and fasciculations were absent.   Motor Tone - Muscle tone was assessed at the neck and appendages and was normal.  Reflexes - [name] reflexes were 1+ decreased in all extremities and he had no pathological reflexes.  Sensory - Light touch, temperature/pinprick were assessed and were symmetrical. However, significant tenderness on touch for left popliteal fossa, mild tenderness at calf and foot. Less tenderness at right foot.    Coordination - The patient had normal movements in the hands with no ataxia or dysmetria.  Tremor was absent.  Gait and Station - deferred.   ASSESSMENT/PLAN Albert Harris is a 67 y.o. male with history of ESRD on HD, PAD, HTN, HLD admitted for abdominal pain. Found to have left femoral psudoaneurysm s/p repair. Also found RUE AVG occlusion s/p failed attempt of thrombectomy. Found  confusion and left leg weakness, MRI showed incidental lacunar infarct.    B/l leg weakness and pain, L>R CTA 3/22 - 1.1 cm LEFT common femoral artery pseudoaneurysm. Long segment LEFT SFA occlusion. Residual/recurrent stenosis at the distal margin of the RIGHT external iliac stent. Patent RIGHT fem-pop graft. Patent LEFT iliac stents. S/p left femoral artery pseudoaneurysm repair 3/22 Now reporting b/l leg weakness with pain and tenderness on touch, L>R  MRI L-spine unremarkable MRI C-spine chronic C5-6 chord compression and signal change MRI T-spine pending Concerning for bilateral leg vasculopathy, L>R LE arterial doppler Left chronically occluded SFA. Monophasic and dampened monophasic flow noted in the popliteal, posterior tibial, anterior tibial, and peroneal arteries.  Right patent fem-pop bypass graft. 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 Ongoing aggressive stroke risk factor management Therapy recommendations:  pending Disposition:  pending  Retroperitoneal hemorrhage 3/22 CTA anterior pelvic extraperitoneal hematoma measuring up to 11.6 cm diameter. No active extravasation evident. 3/30 CT abd Anterior pelvic extraperitoneal hematoma within the prevesical space. Right-greater-than-left extension of hemorrhage within the extraperitoneal lateral pelvic walls. VVS on board Plan for CTA if needed after HD catheter tomorrow  PVD Hx of common and left  external iliac artery stenting, and right fem-pop bypass CTA 3/22 - 1.1 cm LEFT common femoral artery pseudoaneurysm. Long segment LEFT SFA occlusion. Residual/recurrent stenosis at the distal margin of the RIGHT external iliac stent. Patent RIGHT fem-pop graft. Patent LEFT iliac stents. S/p left femoral artery pseudoaneurysm repair 3/22 S/p AVG thrombectomy attempt but patient became hemodynamically unstable and procedure was aborted.  VVS on board Plan for dialysis catheter on Monday  Bacteremia  Hx of possible osteomyelitis  C-spine MRI showed improved edema paraspinal tissue concerning for evidence of prior osteomyelitis at cervical spine.  B Cx + for MRSA likely due to LLE  post op infection B Cx neg 3/27 Leukocytosis WBC 22.3->19.4->19.9->19.8 ID on board On vancomycin TEE pending to rule out endocarditis  Acute anemia Back pain Pt complaining of lower back pain 3/30 Hb 7.5->7.8->9.2->8.1->9.1->7.5->7.0->PRBC->9.2 S/p left femoral artery pseudoaneurysm 3/22 3/30 CT abd Anterior pelvic extraperitoneal hematoma within the prevesical space. Right-greater-than-left extension of hemorrhage within the extraperitoneal lateral pelvic walls. CBC monitoring  Hx of hypertension Transient hypotension 3/30 Stable now S/p IVF bolus and PRBC 3/30 - responded well Long term BP goal normotensive  Hyperlipidemia Home meds:  crestor 10  LDL 45, goal < 70 Resume crestor 10 No high intensity statin given LDL at goal Continue statin on discharge  Other Stroke Risk Factors Advanced age Former Cigarette smoker ESRD on HD - AVG occluded s/p thrombectomy attempts, not successful, pending catheter on Monday.   Other Active Problems Hyponatremia - Na 131->133  Hospital day # 9  This patient is critically ill due to retroperitoneal hematoma, bacteremia, stroke, b/l leg vasculopathy, acute anemia, PVD and at significant risk of neurological worsening, death form ischemic leg, recurrent stroke,  endocarditis, sepsis. This patient's care requires constant monitoring of vital signs, hemodynamics, respiratory and cardiac monitoring, review of multiple databases, neurological assessment, discussion with family, other specialists and medical decision making of high complexity. I spent 35 minutes of neurocritical care time in the care of this patient. I had long discussion with daughter and pt at bedside, updated pt current condition, treatment plan and potential prognosis, and answered all the questions. They expressed understanding and appreciation.   Rosalin Hawking, MD PhD Stroke Neurology 09/06/2022 11:06 AM    To contact Stroke Continuity provider, please refer to [email or web address] After hours, contact General Neurology

## 2022-09-06 NOTE — Progress Notes (Signed)
Subjective  - POD #9,4  Required 1 unit of blood yesterday for a drop in hemoglobin and hypotension.  He is without any change in his lower abdominal pain   Physical Exam:  Left groin incision is soft and nontender. There is some fullness in the suprapubic region which is mildly tender to palpation.   Blood cultures negative    Assessment/Plan:  POD #4,9  Anemia: The patient had a drop in hemoglobin yesterday requiring 1 unit of blood.  He also had a CT scan that showed a slight enlargement of his lower abdominal hematoma.  He had an appropriate response to blood and fluid resuscitation and has remained stable.  The left groin remains soft without hematoma.  I am little concerned because he has a prosthetic patch there that has already bled once and he had positive blood cultures, however there is no evidence that he has had any additional bleeding from this area.  On review of his prior scans before his operation several days ago, I question if there is a branch feeding the hematoma which is responsible for this bleed rather than his groin.  Therefore if he has other issues with bleeding, a CT angiogram should be obtained.  He can remain off of IV heparin.  He will be n.p.o. after midnight with plans for tunneled dialysis catheter placement tomorrow  Albert Harris 09/06/2022 9:21 AM --  Vitals:   09/06/22 0630 09/06/22 0700  BP:    Pulse: 76   Resp: (!) 29   Temp:  99.1 F (37.3 C)  SpO2: 97%     Intake/Output Summary (Last 24 hours) at 09/06/2022 0921 Last data filed at 09/05/2022 1800 Gross per 24 hour  Intake 1748.74 ml  Output --  Net 1748.74 ml     Laboratory CBC    Component Value Date/Time   WBC 19.8 (H) 09/06/2022 0608   HGB 9.2 (L) 09/06/2022 0608   HGB 10.4 (L) 06/18/2021 1431   HCT 27.7 (L) 09/06/2022 0608   HCT 31.6 (L) 06/18/2021 1431   PLT 189 09/06/2022 0608   PLT 142 (L) 06/18/2021 1431    BMET    Component Value Date/Time   NA 133 (L)  09/06/2022 0608   NA 141 06/18/2021 1431   K 4.3 09/06/2022 0608   CL 96 (L) 09/06/2022 0608   CO2 19 (L) 09/06/2022 0608   GLUCOSE 92 09/06/2022 0608   BUN 81 (H) 09/06/2022 0608   BUN 20 06/18/2021 1431   CREATININE 7.28 (H) 09/06/2022 0608   CREATININE 1.15 02/19/2017 1049   CALCIUM 8.5 (L) 09/06/2022 0608   CALCIUM 8.5 (L) 06/10/2020 0900   GFRNONAA 8 (L) 09/06/2022 0608   GFRAA 6 (L) 03/04/2020 1045    COAG Lab Results  Component Value Date   INR 1.2 08/12/2022   INR 1.3 (H) 06/29/2022   INR 1.3 (H) 06/28/2022   No results found for: "PTT"  Antibiotics Anti-infectives (From admission, onward)    Start     Dose/Rate Route Frequency Ordered Stop   09/05/22 1600  vancomycin (VANCOREADY) IVPB 750 mg/150 mL        750 mg 150 mL/hr over 60 Minutes Intravenous  Once 09/05/22 1505 09/05/22 1717   09/02/22 1227  ceFAZolin (ANCEF) 2-4 GM/100ML-% IVPB       Note to Pharmacy: Dallie Piles, Destiny: cabinet override      09/02/22 1227 09/03/22 0044   09/02/22 0520  vancomycin variable dose per unstable renal function (pharmacist dosing)  Status:  Discontinued         Does not apply See admin instructions 09/02/22 0521 09/05/22 0940   08/31/22 1800  vancomycin (VANCOREADY) IVPB 750 mg/150 mL  Status:  Discontinued        750 mg 150 mL/hr over 60 Minutes Intravenous Every 24 hours 08/31/22 0822 09/02/22 0521   08/30/22 1800  vancomycin (VANCOREADY) IVPB 1500 mg/300 mL        1,500 mg 150 mL/hr over 120 Minutes Intravenous STAT 08/30/22 1739 08/30/22 2139        V. Leia Alf, M.D., [facility] Office: [phone] Pager:  [phone]

## 2022-09-07 ENCOUNTER — Inpatient Hospital Stay (HOSPITAL_COMMUNITY): Payer: Medicare Other

## 2022-09-07 ENCOUNTER — Encounter (HOSPITAL_COMMUNITY)
Admission: EM | Disposition: A | Discharge status: Hospice | Payer: Self-pay | Source: Home / Self Care | Attending: Internal Medicine

## 2022-09-07 ENCOUNTER — Inpatient Hospital Stay (HOSPITAL_COMMUNITY): Payer: Medicare Other | Admitting: Anesthesiology

## 2022-09-07 ENCOUNTER — Encounter (HOSPITAL_COMMUNITY): Payer: Self-pay

## 2022-09-07 ENCOUNTER — Encounter (HOSPITAL_COMMUNITY): Payer: Self-pay | Admitting: Pulmonary Disease

## 2022-09-07 ENCOUNTER — Other Ambulatory Visit: Payer: Self-pay

## 2022-09-07 DIAGNOSIS — E872 Acidosis, unspecified: Secondary | ICD-10-CM | POA: Diagnosis not present

## 2022-09-07 DIAGNOSIS — Z992 Dependence on renal dialysis: Secondary | ICD-10-CM

## 2022-09-07 DIAGNOSIS — I340 Nonrheumatic mitral (valve) insufficiency: Secondary | ICD-10-CM | POA: Diagnosis not present

## 2022-09-07 DIAGNOSIS — I088 Other rheumatic multiple valve diseases: Secondary | ICD-10-CM

## 2022-09-07 DIAGNOSIS — I509 Heart failure, unspecified: Secondary | ICD-10-CM

## 2022-09-07 DIAGNOSIS — Z87891 Personal history of nicotine dependence: Secondary | ICD-10-CM

## 2022-09-07 DIAGNOSIS — I132 Hypertensive heart and chronic kidney disease with heart failure and with stage 5 chronic kidney disease, or end stage renal disease: Secondary | ICD-10-CM | POA: Diagnosis not present

## 2022-09-07 DIAGNOSIS — N186 End stage renal disease: Secondary | ICD-10-CM | POA: Diagnosis not present

## 2022-09-07 DIAGNOSIS — N185 Chronic kidney disease, stage 5: Secondary | ICD-10-CM | POA: Diagnosis not present

## 2022-09-07 DIAGNOSIS — J45909 Unspecified asthma, uncomplicated: Secondary | ICD-10-CM

## 2022-09-07 HISTORY — PX: INSERTION OF DIALYSIS CATHETER: SHX1324

## 2022-09-07 HISTORY — PX: TEE WITHOUT CARDIOVERSION: SHX5443

## 2022-09-07 LAB — RENAL FUNCTION PANEL
Albumin: 1.7 g/dL — ABNORMAL LOW (ref 3.5–5.0)
Anion gap: 20 — ABNORMAL HIGH (ref 5–15)
BUN: 100 mg/dL — ABNORMAL HIGH (ref 8–23)
CO2: 19 mmol/L — ABNORMAL LOW (ref 22–32)
Calcium: 8.3 mg/dL — ABNORMAL LOW (ref 8.9–10.3)
Chloride: 90 mmol/L — ABNORMAL LOW (ref 98–111)
Creatinine, Ser: 8.7 mg/dL — ABNORMAL HIGH (ref 0.61–1.24)
GFR, Estimated: 6 mL/min — ABNORMAL LOW (ref >60)
Glucose, Bld: 98 mg/dL (ref 70–99)
Phosphorus: 7.8 mg/dL — ABNORMAL HIGH (ref 2.5–4.6)
Potassium: 4.5 mmol/L (ref 3.5–5.1)
Sodium: 129 mmol/L — ABNORMAL LOW (ref 135–145)

## 2022-09-07 LAB — CBC
HCT: 26.5 % — ABNORMAL LOW (ref 39.0–52.0)
HCT: 27.7 % — ABNORMAL LOW (ref 39.0–52.0)
Hemoglobin: 8.5 g/dL — ABNORMAL LOW (ref 13.0–17.0)
Hemoglobin: 9.2 g/dL — ABNORMAL LOW (ref 13.0–17.0)
MCH: 30.7 pg (ref 26.0–34.0)
MCH: 30.9 pg (ref 26.0–34.0)
MCHC: 32.1 g/dL (ref 30.0–36.0)
MCHC: 33.2 g/dL (ref 30.0–36.0)
MCV: 95.7 fL (ref 80.0–100.0)
MCV: 93 fL (ref 80.0–100.0)
Platelets: 236 10*3/uL (ref 150–400)
Platelets: 234 10*3/uL (ref 150–400)
RBC: 2.77 MIL/uL — ABNORMAL LOW (ref 4.22–5.81)
RBC: 2.98 MIL/uL — ABNORMAL LOW (ref 4.22–5.81)
RDW: 19 % — ABNORMAL HIGH (ref 11.5–15.5)
RDW: 18.6 % — ABNORMAL HIGH (ref 11.5–15.5)
WBC: 24.4 10*3/uL — ABNORMAL HIGH (ref 4.0–10.5)
WBC: 29.8 10*3/uL — ABNORMAL HIGH (ref 4.0–10.5)
nRBC: 0 % (ref 0.0–0.2)
nRBC: 0 % (ref 0.0–0.2)

## 2022-09-07 LAB — ECHO TEE
AR max vel: 1.45 cm2
AV Area VTI: 1.29 cm2
AV Area mean vel: 1.32 cm2
AV Mean grad: 9 mmHg
AV Peak grad: 17.1 mmHg
Ao pk vel: 2.07 m/s

## 2022-09-07 LAB — CULTURE, BLOOD (ROUTINE X 2)
Culture: NO GROWTH
Culture: NO GROWTH
Special Requests: ADEQUATE

## 2022-09-07 LAB — HEMOGLOBIN A1C
Hgb A1c MFr Bld: 5.1 % (ref 4.8–5.6)
Mean Plasma Glucose: 100 mg/dL

## 2022-09-07 LAB — MAGNESIUM: Magnesium: 2.4 mg/dL (ref 1.7–2.4)

## 2022-09-07 SURGERY — ECHOCARDIOGRAM, TRANSESOPHAGEAL
Anesthesia: General

## 2022-09-07 SURGERY — INSERTION OF DIALYSIS CATHETER
Anesthesia: General

## 2022-09-07 SURGERY — ECHOCARDIOGRAM, TRANSESOPHAGEAL
Anesthesia: Monitor Anesthesia Care

## 2022-09-07 MED ORDER — SODIUM CHLORIDE 0.9 % IV SOLN: INTRAVENOUS | Status: DC

## 2022-09-07 MED ORDER — ONDANSETRON HCL 4 MG/2ML IJ SOLN
INTRAMUSCULAR | Status: AC
  Filled 2022-09-07: qty 2

## 2022-09-07 MED ORDER — LIDOCAINE 2% (20 MG/ML) 5 ML SYRINGE
INTRAMUSCULAR | Status: AC
  Filled 2022-09-07: qty 5

## 2022-09-07 MED ORDER — CEFAZOLIN SODIUM-DEXTROSE 2-4 GM/100ML-% IV SOLN
INTRAVENOUS | Status: AC
  Filled 2022-09-07: qty 100

## 2022-09-07 MED ORDER — PROPOFOL 500 MG/50ML IV EMUL
INTRAVENOUS | Status: DC | PRN
  Administered 2022-09-07: 100 ug/kg/min via INTRAVENOUS

## 2022-09-07 MED ORDER — CHLORHEXIDINE GLUCONATE 0.12 % MT SOLN
OROMUCOSAL | Status: AC
  Administered 2022-09-07: 15 mL via OROMUCOSAL
  Filled 2022-09-07: qty 15

## 2022-09-07 MED ORDER — CEFAZOLIN SODIUM-DEXTROSE 2-4 GM/100ML-% IV SOLN
2.0000 g | Freq: Once | INTRAVENOUS | Status: AC
  Administered 2022-09-07: 2 g via INTRAVENOUS
  Filled 2022-09-07: qty 100

## 2022-09-07 MED ORDER — PHENYLEPHRINE HCL-NACL 20-0.9 MG/250ML-% IV SOLN
INTRAVENOUS | Status: DC | PRN
  Administered 2022-09-07: 25 ug/min via INTRAVENOUS

## 2022-09-07 MED ORDER — CHLORHEXIDINE GLUCONATE 0.12 % MT SOLN: 15.0000 mL | Freq: Once | OROMUCOSAL | Status: AC

## 2022-09-07 MED ORDER — FENTANYL CITRATE (PF) 250 MCG/5ML IJ SOLN
INTRAMUSCULAR | Status: AC
  Filled 2022-09-07: qty 5

## 2022-09-07 MED ORDER — VANCOMYCIN VARIABLE DOSE PER UNSTABLE RENAL FUNCTION (PHARMACIST DOSING): Status: DC

## 2022-09-07 MED ORDER — LIDOCAINE HCL (PF) 1 % IJ SOLN
INTRAMUSCULAR | Status: AC
  Filled 2022-09-07: qty 30

## 2022-09-07 MED ORDER — PHENYLEPHRINE 80 MCG/ML (10ML) SYRINGE FOR IV PUSH (FOR BLOOD PRESSURE SUPPORT)
PREFILLED_SYRINGE | INTRAVENOUS | Status: DC | PRN
  Administered 2022-09-07: 80 ug via INTRAVENOUS

## 2022-09-07 MED ORDER — DEXAMETHASONE SODIUM PHOSPHATE 10 MG/ML IJ SOLN
INTRAMUSCULAR | Status: AC
  Filled 2022-09-07: qty 1

## 2022-09-07 MED ORDER — ROCURONIUM BROMIDE 10 MG/ML (PF) SYRINGE
PREFILLED_SYRINGE | INTRAVENOUS | Status: AC
  Filled 2022-09-07: qty 10

## 2022-09-07 MED ORDER — FENTANYL CITRATE (PF) 100 MCG/2ML IJ SOLN: 25.0000 ug | INTRAMUSCULAR | Status: DC | PRN

## 2022-09-07 MED ORDER — HEPARIN SODIUM (PORCINE) 1000 UNIT/ML IJ SOLN
INTRAMUSCULAR | Status: AC
  Filled 2022-09-07: qty 10

## 2022-09-07 MED ORDER — AMISULPRIDE (ANTIEMETIC) 5 MG/2ML IV SOLN: 10.0000 mg | Freq: Once | INTRAVENOUS | Status: DC | PRN

## 2022-09-07 MED ORDER — ORAL CARE MOUTH RINSE: 15.0000 mL | Freq: Once | OROMUCOSAL | Status: AC

## 2022-09-07 MED ORDER — PROPOFOL 10 MG/ML IV BOLUS
INTRAVENOUS | Status: AC
  Filled 2022-09-07: qty 20

## 2022-09-07 MED ORDER — PROMETHAZINE HCL 25 MG/ML IJ SOLN: 6.2500 mg | INTRAMUSCULAR | Status: DC | PRN

## 2022-09-07 MED ORDER — FENTANYL CITRATE (PF) 250 MCG/5ML IJ SOLN
INTRAMUSCULAR | Status: DC | PRN
  Administered 2022-09-07: 25 ug via INTRAVENOUS

## 2022-09-07 MED ORDER — PROPOFOL 10 MG/ML IV BOLUS
INTRAVENOUS | Status: DC | PRN
  Administered 2022-09-07: 50 ug/kg/min via INTRAVENOUS

## 2022-09-07 MED ORDER — PROPOFOL 10 MG/ML IV BOLUS
INTRAVENOUS | Status: DC | PRN
  Administered 2022-09-07: 100 mg via INTRAVENOUS

## 2022-09-07 MED ORDER — 0.9 % SODIUM CHLORIDE (POUR BTL) OPTIME
TOPICAL | Status: DC | PRN
  Administered 2022-09-07: 1000 mL

## 2022-09-07 MED ORDER — HEPARIN 6000 UNIT IRRIGATION SOLUTION
Status: DC | PRN
  Administered 2022-09-07: 1

## 2022-09-07 MED ORDER — DEXAMETHASONE SODIUM PHOSPHATE 10 MG/ML IJ SOLN
INTRAMUSCULAR | Status: DC | PRN
  Administered 2022-09-07: 10 mg via INTRAVENOUS

## 2022-09-07 MED ORDER — HEPARIN SODIUM (PORCINE) 1000 UNIT/ML IJ SOLN
INTRAMUSCULAR | Status: DC | PRN
  Administered 2022-09-07: 3200 [IU]

## 2022-09-07 MED ORDER — EPHEDRINE SULFATE-NACL 50-0.9 MG/10ML-% IV SOSY
PREFILLED_SYRINGE | INTRAVENOUS | Status: DC | PRN
  Administered 2022-09-07: 10 mg via INTRAVENOUS

## 2022-09-07 MED ORDER — HEPARIN 6000 UNIT IRRIGATION SOLUTION
Status: AC
  Filled 2022-09-07: qty 500

## 2022-09-07 MED ORDER — TETRACAINE HCL 1 % IJ SOLN
100.0000 mg | Freq: Once | INTRAMUSCULAR | Status: DC
  Filled 2022-09-07: qty 10

## 2022-09-07 MED ORDER — PHENYLEPHRINE HCL-NACL 20-0.9 MG/250ML-% IV SOLN
INTRAVENOUS | Status: AC
  Filled 2022-09-07: qty 250

## 2022-09-07 SURGICAL SUPPLY — 48 items
ADH SKN CLS APL DERMABOND .7 (GAUZE/BANDAGES/DRESSINGS) ×1
BAG COUNTER SPONGE SURGICOUNT (BAG) ×2 IMPLANT
BAG DECANTER FOR FLEXI CONT (MISCELLANEOUS) ×2 IMPLANT
BAG SPNG CNTER NS LX DISP (BAG) ×1
BIOPATCH RED 1 DISK 7.0 (GAUZE/BANDAGES/DRESSINGS) ×2 IMPLANT
CATH PALINDROME-P 19CM W/VT (CATHETERS) ×1 IMPLANT
CATH PALINDROME-P 23CM W/VT (CATHETERS) IMPLANT
CATH PALINDROME-P 28CM W/VT (CATHETERS) IMPLANT
CATH STRAIGHT 5FR 65CM (CATHETERS) IMPLANT
COVER PROBE W GEL 5X96 (DRAPES) ×2 IMPLANT
COVER SURGICAL LIGHT HANDLE (MISCELLANEOUS) ×2 IMPLANT
DERMABOND ADVANCED .7 DNX12 (GAUZE/BANDAGES/DRESSINGS) ×2 IMPLANT
DRAPE C-ARM 42X72 X-RAY (DRAPES) ×2 IMPLANT
DRAPE CHEST BREAST 15X10 FENES (DRAPES) ×2 IMPLANT
DRSG MEPILEX SACRM 8.7X9.8 (GAUZE/BANDAGES/DRESSINGS) ×1 IMPLANT
GAUZE 4X4 16PLY ~~LOC~~+RFID DBL (SPONGE) ×2 IMPLANT
GLOVE BIOGEL PI IND STRL 8 (GLOVE) ×2 IMPLANT
GOWN STRL REUS W/ TWL LRG LVL3 (GOWN DISPOSABLE) ×4 IMPLANT
GOWN STRL REUS W/TWL 2XL LVL3 (GOWN DISPOSABLE) ×4 IMPLANT
GOWN STRL REUS W/TWL LRG LVL3 (GOWN DISPOSABLE) ×2
KIT BASIN OR (CUSTOM PROCEDURE TRAY) ×2 IMPLANT
KIT PALINDROME-P 55CM (CATHETERS) IMPLANT
KIT TURNOVER KIT B (KITS) ×2 IMPLANT
NDL 18GX1X1/2 (RX/OR ONLY) (NEEDLE) ×1 IMPLANT
NDL HYPO 25GX1X1/2 BEV (NEEDLE) ×1 IMPLANT
NEEDLE 18GX1X1/2 (RX/OR ONLY) (NEEDLE) ×1 IMPLANT
NEEDLE HYPO 25GX1X1/2 BEV (NEEDLE) ×1 IMPLANT
NS IRRIG 1000ML POUR BTL (IV SOLUTION) ×2 IMPLANT
PACK BASIC III (CUSTOM PROCEDURE TRAY) ×1
PACK SRG BSC III STRL LF ECLPS (CUSTOM PROCEDURE TRAY) ×2 IMPLANT
PAD ARMBOARD 7.5X6 YLW CONV (MISCELLANEOUS) ×4 IMPLANT
SET MICROPUNCTURE 5F STIFF (MISCELLANEOUS) IMPLANT
SOAP 2 % CHG 4 OZ (WOUND CARE) ×2 IMPLANT
SPIKE FLUID TRANSFER (MISCELLANEOUS) ×2 IMPLANT
SUT ETHILON 3 0 PS 1 (SUTURE) ×2 IMPLANT
SUT MNCRL AB 4-0 PS2 18 (SUTURE) ×2 IMPLANT
SUT VIC AB 3-0 SH 27 (SUTURE) ×1
SUT VIC AB 3-0 SH 27X BRD (SUTURE) ×1 IMPLANT
SYR 10ML LL (SYRINGE) ×2 IMPLANT
SYR 20ML LL LF (SYRINGE) ×4 IMPLANT
SYR 5ML LL (SYRINGE) ×2 IMPLANT
SYR CONTROL 10ML LL (SYRINGE) ×2 IMPLANT
TOWEL GREEN STERILE (TOWEL DISPOSABLE) ×2 IMPLANT
TOWEL GREEN STERILE FF (TOWEL DISPOSABLE) ×2 IMPLANT
WATER STERILE IRR 1000ML POUR (IV SOLUTION) ×2 IMPLANT
WIRE AMPLATZ SS-J .035X180CM (WIRE) IMPLANT
WIRE BENTSON .035X145CM (WIRE) ×1 IMPLANT
WIRE MICRO SET SILHO 5FR 7 (SHEATH) ×1 IMPLANT

## 2022-09-07 SURGICAL SUPPLY — 45 items
ADH SKN CLS APL DERMABOND .7 (GAUZE/BANDAGES/DRESSINGS) ×1
BAG COUNTER SPONGE SURGICOUNT (BAG) ×2 IMPLANT
BAG DECANTER FOR FLEXI CONT (MISCELLANEOUS) ×2 IMPLANT
BAG SPNG CNTER NS LX DISP (BAG) ×1
BIOPATCH RED 1 DISK 7.0 (GAUZE/BANDAGES/DRESSINGS) ×2 IMPLANT
CATH PALINDROME-P 19CM W/VT (CATHETERS) IMPLANT
CATH PALINDROME-P 23CM W/VT (CATHETERS) IMPLANT
CATH PALINDROME-P 28CM W/VT (CATHETERS) IMPLANT
CATH STRAIGHT 5FR 65CM (CATHETERS) IMPLANT
COVER PROBE W GEL 5X96 (DRAPES) ×2 IMPLANT
COVER SURGICAL LIGHT HANDLE (MISCELLANEOUS) ×2 IMPLANT
DERMABOND ADVANCED .7 DNX12 (GAUZE/BANDAGES/DRESSINGS) ×2 IMPLANT
DRAPE C-ARM 42X72 X-RAY (DRAPES) ×2 IMPLANT
DRAPE CHEST BREAST 15X10 FENES (DRAPES) ×2 IMPLANT
DRSG MEPILEX SACRM 8.7X9.8 (GAUZE/BANDAGES/DRESSINGS) IMPLANT
GAUZE 4X4 16PLY ~~LOC~~+RFID DBL (SPONGE) ×2 IMPLANT
GLOVE BIOGEL PI IND STRL 8 (GLOVE) ×2 IMPLANT
GOWN STRL REUS W/ TWL LRG LVL3 (GOWN DISPOSABLE) ×4 IMPLANT
GOWN STRL REUS W/TWL 2XL LVL3 (GOWN DISPOSABLE) ×4 IMPLANT
GOWN STRL REUS W/TWL LRG LVL3 (GOWN DISPOSABLE) ×2
GUIDEWIRE BENTSON (WIRE) IMPLANT
KIT BASIN OR (CUSTOM PROCEDURE TRAY) ×2 IMPLANT
KIT PALINDROME-P 55CM (CATHETERS) IMPLANT
KIT TURNOVER KIT B (KITS) ×2 IMPLANT
NDL 18GX1X1/2 (RX/OR ONLY) (NEEDLE) ×2 IMPLANT
NDL HYPO 25GX1X1/2 BEV (NEEDLE) ×2 IMPLANT
NEEDLE 18GX1X1/2 (RX/OR ONLY) (NEEDLE) ×1 IMPLANT
NEEDLE HYPO 25GX1X1/2 BEV (NEEDLE) ×1 IMPLANT
NS IRRIG 1000ML POUR BTL (IV SOLUTION) ×2 IMPLANT
PACK BASIC III (CUSTOM PROCEDURE TRAY) ×1
PACK SRG BSC III STRL LF ECLPS (CUSTOM PROCEDURE TRAY) ×2 IMPLANT
PAD ARMBOARD 7.5X6 YLW CONV (MISCELLANEOUS) ×4 IMPLANT
SET MICROPUNCTURE 5F STIFF (MISCELLANEOUS) IMPLANT
SOAP 2 % CHG 4 OZ (WOUND CARE) ×2 IMPLANT
SPIKE FLUID TRANSFER (MISCELLANEOUS) ×2 IMPLANT
SUT ETHILON 3 0 PS 1 (SUTURE) ×2 IMPLANT
SUT MNCRL AB 4-0 PS2 18 (SUTURE) ×2 IMPLANT
SYR 10ML LL (SYRINGE) ×2 IMPLANT
SYR 20ML LL LF (SYRINGE) ×4 IMPLANT
SYR 5ML LL (SYRINGE) ×2 IMPLANT
SYR CONTROL 10ML LL (SYRINGE) ×2 IMPLANT
TOWEL GREEN STERILE (TOWEL DISPOSABLE) ×2 IMPLANT
TOWEL GREEN STERILE FF (TOWEL DISPOSABLE) ×2 IMPLANT
WATER STERILE IRR 1000ML POUR (IV SOLUTION) ×2 IMPLANT
WIRE AMPLATZ SS-J .035X180CM (WIRE) IMPLANT

## 2022-09-07 NOTE — Transfer of Care (Signed)
Immediate Anesthesia Transfer of Care Note  Patient: Albert Harris  Procedure(s) Performed: INSERTION OF DIALYSIS CATHETER  Patient Location: PACU  Anesthesia Type:General  Level of Consciousness: drowsy and patient cooperative  Airway & Oxygen Therapy: Patient Spontanous Breathing  Post-op Assessment: Report given to RN and Post -op Vital signs reviewed and stable  Post vital signs: Reviewed and stable  Last Vitals:  Vitals Value Taken Time  BP 130/60 09/07/22 1422  Temp    Pulse 83 09/07/22 1424  Resp 18 09/07/22 1424  SpO2 96 % 09/07/22 1424  Vitals shown include unvalidated device data.  Last Pain:  Vitals:   09/07/22 1305  TempSrc:   PainSc: 0-No pain      Patients Stated Pain Goal: 4 ([id number])  Complications: No notable events documented.

## 2022-09-07 NOTE — Anesthesia Postprocedure Evaluation (Signed)
Anesthesia Post Note  Patient: Albert Harris  Procedure(s) Performed: INSERTION OF DIALYSIS CATHETER     Patient location during evaluation: PACU Anesthesia Type: General Level of consciousness: sedated Pain management: pain level controlled Vital Signs Assessment: post-procedure vital signs reviewed and stable Respiratory status: spontaneous breathing and respiratory function stable Cardiovascular status: stable Postop Assessment: no apparent nausea or vomiting Anesthetic complications: no   No notable events documented.  Last Vitals:  Vitals:   09/07/22 1500 09/07/22 1515  BP: 123/60 (!) 128/106  Pulse: 82 87  Resp: (!) 21 (!) 23  Temp: 37.3 C 36.6 C  SpO2: 95% 97%    Last Pain:  Vitals:   09/07/22 1515  TempSrc: Oral  PainSc:                  Stephenia Vogan DANIEL

## 2022-09-07 NOTE — Anesthesia Preprocedure Evaluation (Signed)
Anesthesia Evaluation  Patient identified by MRN, date of birth, ID band Patient awake    Reviewed: Allergy & Precautions, NPO status , Patient's Chart, lab work & pertinent test results  History of Anesthesia Complications Negative for: history of anesthetic complications  Airway Mallampati: I  TM Distance: >3 FB Neck ROM: Full    Dental  (+) Dental Advisory Given, Poor Dentition,    Pulmonary asthma , COPD, former smoker   Pulmonary exam normal        Cardiovascular hypertension, Pt. on home beta blockers and Pt. on medications pulmonary hypertension+ Peripheral Vascular Disease (Critical limb ischemia of right lower extremity) and +CHF  Normal cardiovascular exam  Echo 06/2022  1. Left ventricular ejection fraction, by estimation, is 35 to 40%. The left ventricle has moderately decreased function. The left ventricle demonstrates regional wall motion abnormalities (see scoring diagram/findings for description). There is moderate concentric left ventricular hypertrophy. Left ventricular diastolic parameters are consistent with Grade I diastolic dysfunction (impaired relaxation). Elevated left ventricular end-diastolic pressure.   2. Right ventricular systolic function is normal. The right ventricular size is normal. There is severely elevated pulmonary artery systolic pressure.   3. Left atrial size was severely dilated.   4. Right atrial size was severely dilated.   5. The mitral valve is myxomatous. Mild mitral valve regurgitation. No evidence of mitral stenosis.   6. The aortic valve is normal in structure. There is mild calcification of the aortic valve. There is mild thickening of the aortic valve. Aortic valve regurgitation is not visualized. Aortic valve sclerosis/calcification is present, without any evidence of aortic stenosis. Aortic valve area, by VTI measures 2.74 cm. Aortic valve mean gradient measures 9.0 mmHg. Aortic valve  Vmax measures 2.04 m/s.   7. The inferior vena cava is normal in size with <50% respiratory variability, suggesting right atrial pressure of 8 mmHg.   IMPRESSIONS     1. Left ventricular ejection fraction, by estimation, is 45%. There is  moderate to severe concentric left ventricular hypertrophy. Diastolic  function was not assessed.   2. Right ventricular systolic function is normal.   3. The mitral valve is grossly normal with mild thickening of the mitral  valve leaflets. Mild mitral valve regurgitation.   4. Right atrial size was mildly dilated.   5. Left atrial size was mildly dilated.   6. The inferior vena cava is normal in size with greater than 50%  respiratory variability, suggesting right atrial pressure of 3 mmHg.   7. The aortic valve was not well visualized. There is mild calcification  of the aortic valve. There is mild thickening of the aortic valve. Aortic  valve regurgitation is not visualized.      Neuro/Psych TIACVA    GI/Hepatic ,GERD  ,,(+)     substance abuse  alcohol use  Endo/Other  negative endocrine ROS    Renal/GU ESRF and DialysisRenal disease     Musculoskeletal negative musculoskeletal ROS (+)    Abdominal   Peds  Hematology  (+) Blood dyscrasia (Plavix), anemia   Anesthesia Other Findings   Reproductive/Obstetrics                              Anesthesia Physical Anesthesia Plan  ASA: 4  Anesthesia Plan: General   Post-op Pain Management: Minimal or no pain anticipated and Tylenol PO (pre-op)*   Induction: Intravenous  PONV Risk Score and Plan: 2 and Treatment may vary due  to age or medical condition, Ondansetron and Dexamethasone  Airway Management Planned: LMA  Additional Equipment:   Intra-op Plan:   Post-operative Plan: Extubation in OR  Informed Consent: I have reviewed the patients History and Physical, chart, labs and discussed the procedure including the risks, benefits and  alternatives for the proposed anesthesia with the patient or authorized representative who has indicated his/her understanding and acceptance.     Dental advisory given  Plan Discussed with: Anesthesiologist and CRNA  Anesthesia Plan Comments:         Anesthesia Quick Evaluation

## 2022-09-07 NOTE — Progress Notes (Signed)
Interventional Radiology Brief Note:  IR consulted for tunneled dialysis catheter placement today, however patient is also scheduled with Vascular Surgery placement in the OR.   Will cancel order for IR tunneled catheter placement.   Brynda Greathouse, MS RD PA-C

## 2022-09-07 NOTE — Progress Notes (Signed)
[facility]  Date of Admission:  08/28/2022     Principal Problem:   Lactic acidosis Active Problems:   Metabolic encephalopathy   ESRD (end stage renal disease) on dialysis   Malnutrition of moderate degree   Septic shock   Pseudoaneurysm of femoral artery   Decreased functional mobility          Assessment: 67 year old male with past medical history of MRSA bacteremia in 2022, PAD, ESRD HD, recently hospitalized 3/16-18 for acute on chronic left lower extremity ischemia.  He had undergone femoral endarterectomy with bovine pericardial patch, left iliofemoral profundofemoral thromboembolectomy, stent of the left common and external iliac arteries 3/16 with Dr. Donzetta Matters, vascular surgery admitted with left femoral pseudoaneurysm found to have MRSA bacteremia   #MRSA bacteremia likely secondary to left femoral wound postop infection\ #C/F IE given CVA c/w embolic disease - On [id number] CT angio showed 1.1 cm left common femoral artery pseudoaneurysm, new.  New anterior pelvic extraperitoneal hematoma. -OR with Dr. Virl Cagey for left groin exploration, hematoma evacuation on 3/23. OR findings showed dehiscence at proximal aspect of bovine pericardial patch at the suture line. -Per OR note on 3/16 there is a stent in the left common and external iliac arteries.  The stents are left in place, patient will need to be on suppressive antibiotics following completion of likely 4-6 weeks of IV therapy. -MRI brain showed 4 mm acute infarct in right subinsular white matter on 3/29 -MRI L spine negative for infection, MR C spine showed improvement of marrow edema at C5-6. 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 -CT AP on 3/30 showed extension of hemorrhage in extraperitoneal lateral pelvic wall.  Recommendations: - Continue vancomycin  -TTE showed AV/MC valve  thickening. In a pr with RA bacteremia, ESRD with HD and recent post op vascular infection recommend TEE. Tee planned for today - No plans at this time for bovine patch/stent removal given risks. From the  antibiotic perspective will plan on suppressive regimen once treatment IV course complete.    #History of ESRD on intermittent HD via right AV fistula -R AV fistula thrombectomy attempted and aborted on 3/28 due to severe HTN   #Family c/o pt has been chronically "jaundiced" -LFT s NL , NL PT/INR -Consider repeating iron studies  Microbiology:   Antibiotics: Vancomycin 3/24-   Cultures: Blood 3/22 2/2 MRSA  3/25 GPC 3/27   SUBJECTIVE: Resting in  bed. More laert today Interval:  Afebrile overnight. Wbc 24k Review of Systems: Review of Systems  All other systems reviewed and are negative.    Scheduled Meds:  (feeding supplement) PROSource Plus  30 mL Oral BID BM   Chlorhexidine Gluconate Cloth  6 each Topical Q0600   darbepoetin (ARANESP) injection - DIALYSIS  100 mcg Subcutaneous Q Wed-1800   feeding supplement (NEPRO CARB STEADY)  237 mL Oral BID BM   heparin injection (subcutaneous)  5,000 Units Subcutaneous Q8H   multivitamin  1 tablet Oral QHS   pantoprazole  40 mg Oral Daily   rosuvastatin  10 mg Oral Daily   sodium chloride flush  10-40 mL Intracatheter Q12H   vancomycin variable dose per unstable renal function (pharmacist dosing)   Does not apply See admin instructions   Continuous Infusions:  sodium chloride     sodium chloride Stopped (09/01/22 0400)   sodium chloride  PRN Meds:.Place/Maintain arterial line **AND** sodium chloride, acetaminophen **OR** acetaminophen, alteplase, docusate sodium, heparin, hydrALAZINE, HYDROmorphone (DILAUDID) injection, ondansetron (ZOFRAN) IV, mouth rinse, oxyCODONE, pentafluoroprop-tetrafluoroeth, polyethylene glycol, sodium chloride flush Allergies  Allergen Reactions   Benadryl [Diphenhydramine] Rash    Lidocaine-Prilocaine Rash    Caused red marks up and down arm    OBJECTIVE: Vitals:   09/07/22 0726 09/07/22 0800 09/07/22 0830 09/07/22 0900  BP:  (!) 155/72 134/63 (!) 140/67  Pulse:  77 75   Resp:  (!) 21 17 17   Temp: 97.9 F (36.6 C)     TempSrc: Oral     SpO2:  100% 100%   Weight:      Height:       Body mass index is 14.72 kg/m.  Physical Exam Constitutional:      General: He is not in acute distress.    Appearance: He is normal weight. He is not toxic-appearing.  HENT:     Head: Normocephalic and atraumatic.     Right Ear: External ear normal.     Left Ear: External ear normal.     Nose: No congestion or rhinorrhea.     Mouth/Throat:     Mouth: Mucous membranes are moist.     Pharynx: Oropharynx is clear.  Eyes:     Extraocular Movements: Extraocular movements intact.     Conjunctiva/sclera: Conjunctivae normal.     Pupils: Pupils are equal, round, and reactive to light.  Cardiovascular:     Rate and Rhythm: Normal rate and regular rhythm.     Heart sounds: No murmur heard.    No friction rub. No gallop.  Pulmonary:     Effort: Pulmonary effort is normal.     Breath sounds: Normal breath sounds.  Abdominal:     General: Abdomen is flat. Bowel sounds are normal.     Palpations: Abdomen is soft.  Musculoskeletal:        General: No swelling.     Cervical back: Normal range of motion and neck supple.     Comments: Unable to move LLE  Skin:    General: Skin is warm and dry.     Comments: Wound vac  Neurological:     General: No focal deficit present.     Mental Status: He is oriented to person, place, and time.  Psychiatric:        Mood and Affect: Mood normal.       Lab Results Lab Results  Component Value Date   WBC 24.4 (H) 09/07/2022   HGB 8.5 (L) 09/07/2022   HCT 26.5 (L) 09/07/2022   MCV 95.7 09/07/2022   PLT 236 09/07/2022    Lab Results  Component Value Date   CREATININE 8.70 (H) 09/07/2022   BUN 100 (H) 09/07/2022   NA 129 (L)  09/07/2022   K 4.5 09/07/2022   CL 90 (L) 09/07/2022   CO2 19 (L) 09/07/2022    Lab Results  Component Value Date   ALT <5 08/28/2022   AST 21 08/28/2022   ALKPHOS 122 08/28/2022   BILITOT 1.2 08/28/2022        Laurice Record, [facility] 09/07/2022, 10:44 AM

## 2022-09-07 NOTE — Progress Notes (Signed)
Occupational Therapy Treatment Patient Details Name: DAX VOLD MRN: [id number] DOB: November 18, 1955 Today's Date: 09/07/2022   History of present illness 67 year old male  ER with abdominal pain, nausea, diaphoresis and unable to feel his Lt foot. Hypotensive with lactic acidosis in ER.  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 CRRT 3/22-3/26.  3/22 repair of distal Lt external iliac artery. On 3/30, Hgb drop from 9 to 7 with worsening abdominal pain, CT abd 3/30 shows slightly larger anterior pelvic extraperitoneal hematoma. PMHx: PAD, ESRD HD, recently hospitalized 3/16-18 for acute on chronic left lower extremity ischemia.   OT comments  Pt is making limited progress towards their acute OT goals. He was limited by LLE and sacral pain, L groin site active bleeding and decreased activity tolerance. Overall he required mod-max A +2 for bed mobility and 1x standing attempt with increased time needed for each task for pain management. Dependent peri care completed at bed level, RN notified of bleeding. Pt was expressing needs throughout session but needed increased time and frequent cues to initiate and sequence tasks. Pt's daughter was present and reported improvement in cognition over the weekend. OT to continue to follow acutely to facilitate progress towards established goals. Pt will continue to benefit from intensive inpatient follow up therapy, >3 hours/day after discharge.     Recommendations for follow up therapy are one component of a multi-disciplinary discharge planning process, led by the attending physician.  Recommendations may be updated based on patient status, additional functional criteria and insurance authorization.    Assistance Recommended at Discharge Intermittent Supervision/Assistance  Patient can return home with the following  Direct supervision/assist for medications management;Direct supervision/assist for financial management;Assist for  transportation;Help with stairs or ramp for entrance;A lot of help with walking and/or transfers;A lot of help with bathing/dressing/bathroom;Assistance with cooking/housework   Equipment Recommendations  Other (comment)    Recommendations for Other Services Rehab consult    Precautions / Restrictions Precautions Precautions: Fall Precaution Comments: L groin dressing with bleeding from site (RN aware and to redress after session), sacral wound, stool incontinence Restrictions Weight Bearing Restrictions: No       Mobility Bed Mobility Overal bed mobility: Needs Assistance Bed Mobility: Supine to Sit, Sit to Supine Rolling: Mod assist, +2 for physical assistance Sidelying to sit: Mod assist, +2 for physical assistance   Sit to supine: Max assist, +2 for physical assistance   General bed mobility comments: assist for roll bilat for trunk and LE management. mod-max +2 for all aspects    Transfers Overall transfer level: Needs assistance Equipment used: Rolling walker (2 wheels) Transfers: Sit to/from Stand Sit to Stand: +2 physical assistance, +2 safety/equipment, Max assist           General transfer comment: max +2 for attempted rise to stand, x2 stand attempts. Pt limited by LLE pain and weakness, vss     Balance Overall balance assessment: Needs assistance Sitting-balance support: Feet supported Sitting balance-Leahy Scale: Fair     Standing balance support: Bilateral upper extremity supported, During functional activity, Reliant on assistive device for balance Standing balance-Leahy Scale: Poor                             ADL either performed or assessed with clinical judgement   ADL Overall ADL's : Needs assistance/impaired  Toilet Transfer: Maximal assistance;+2 for physical assistance;+2 for safety/equipment Toilet Transfer Details (indicate cue type and reason): increased assist needed due to LUE  pain Toileting- Clothing Manipulation and Hygiene: Total assistance Toileting - Clothing Manipulation Details (indicate cue type and reason): at bed level - incontinent BM     Functional mobility during ADLs: Maximal assistance;+2 for physical assistance;+2 for safety/equipment General ADL Comments: Increased assist needed for all aspects this date due to pain and active bleeding at L groin site    Extremity/Trunk Assessment Upper Extremity Assessment Upper Extremity Assessment: Generalized weakness   Lower Extremity Assessment Lower Extremity Assessment: Defer to PT evaluation        Vision   Vision Assessment?: No apparent visual deficits   Perception Perception Perception: Within Functional Limits   Praxis Praxis Praxis: Intact    Cognition Arousal/Alertness: Awake/alert Behavior During Therapy: WFL for tasks assessed/performed Overall Cognitive Status: Impaired/Different from baseline Area of Impairment: Following commands, Safety/judgement, Awareness, Attention, Problem solving                 Orientation Level: Disoriented to, Time, Situation Current Attention Level: Selective Memory: Decreased short-term memory Following Commands: Follows one step commands consistently, Follows one step commands with increased time Safety/Judgement: Decreased awareness of deficits, Decreased awareness of safety Awareness: Emergent Problem Solving: Decreased initiation, Requires verbal cues, Difficulty sequencing, Slow processing General Comments: pt pleasant and per pt's daughter at bedside is at his baseline affect. Pt requiring increased processing time for following commands, at times requires rephrasing/restating commands              General Comments vss. bleeding from L groin site, RN aware, bed pad change    Pertinent Vitals/ Pain       Pain Assessment Pain Assessment: Faces Faces Pain Scale: Hurts even more Pain Location: LLE Pain Descriptors / Indicators:  Grimacing, Guarding Pain Intervention(s): Limited activity within patient's tolerance, Monitored during session   Frequency  Min 2X/week        Progress Toward Goals  OT Goals(current goals can now be found in the care plan section)  Progress towards OT goals: Not progressing toward goals - comment (limited by pain)  Acute Rehab OT Goals Patient Stated Goal: less pain OT Goal Formulation: With patient Time For Goal Achievement: 09/16/22 Potential to Achieve Goals: Good ADL Goals Pt Will Perform Grooming: with min assist;sitting Pt Will Perform Upper Body Dressing: with min assist;sitting Pt Will Perform Lower Body Dressing: with mod assist;sit to/from stand Pt Will Transfer to Toilet: with mod assist;stand pivot transfer;bedside commode Additional ADL Goal #1: Pt will follow 3 step command to complete ADL task Additional ADL Goal #2: Pt will identify 3+ fall prevention strategies for use in the home setting.  Plan Discharge plan remains appropriate    Co-evaluation    PT/OT/SLP Co-Evaluation/Treatment: Yes Reason for Co-Treatment: Complexity of the patient's impairments (multi-system involvement);For patient/therapist safety;To address functional/ADL transfers PT goals addressed during session: Mobility/safety with mobility;Balance OT goals addressed during session: ADL's and self-care;Proper use of Adaptive equipment and DME      AM-PAC OT "6 Clicks" Daily Activity     Outcome Measure   Help from another person eating meals?: A Little Help from another person taking care of personal grooming?: A Little Help from another person toileting, which includes using toliet, bedpan, or urinal?: Total Help from another person bathing (including washing, rinsing, drying)?: A Lot Help from another person to put on and taking off regular upper body clothing?: A  Lot Help from another person to put on and taking off regular lower body clothing?: Total 6 Click Score: 12    End of  Session    OT Visit Diagnosis: Unsteadiness on feet (R26.81);Muscle weakness (generalized) (M62.81);Other symptoms and signs involving cognitive function;Dizziness and giddiness (R42)   Activity Tolerance Patient limited by pain   Patient Left in bed;with call bell/phone within reach;with bed alarm set;with family/visitor present   Nurse Communication Mobility status        Time: [id number] OT Time Calculation (min): 23 min  Charges: OT General Charges $OT Visit: 1 Visit OT Treatments $Therapeutic Activity: 8-22 mins  Shade Flood, OTR/L [place] [facility] [place] Communication Preferred   Elliot Cousin 09/07/2022, 11:28 AM

## 2022-09-07 NOTE — Progress Notes (Signed)
NAME:  [facility], MRN:  [id number], DOB:  12/10/1955, LOS: 39 ADMISSION DATE:  08/28/2022, CONSULTATION DATE:  08/28/22 REFERRING MD: Jeannine Kitten , CHIEF COMPLAINT:  Abdominal pain  History of Present Illness:   67 yo male brought to ER with abdominal pain, nausea, diaphoresis and unable to feel his Lt foot.  Hypotensive with lactic acidosis in ER.  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  Hx of ESRD and had hyperkalemia.  Had emergent CRRT.  Vascular consulted and taken to OR.    Pertinent  Medical History  ESRD -AV fistula (no flow tonight), PAD, COPD, HLD, HTN, HFrEF 35-40%, PAD  Significant Hospital Events: Including procedures, antibiotic start and stop dates in addition to other pertinent events   3/22 Admit, emergent CRRT, to OR for repair of distal Lt external iliac artery 3/24 Extubated. CT head obtained for altered mental status did not show any acute abnormalities 3/26 Off CRRT and pressors, Vascular surgery consulted for clotted AVG 3/27 AV graft thrombectomy attempted in OR but patient became hemodynamically unstable and procedure was aborted.  Was on pressors briefly 3/28 dialysis followed by CVC and aline removal for line holiday 3/29 MRI brain ordered for LLE weakness and found with right subinsular 88mm infarct. Neuro consulted 3/30 CT A/P increased hematoma in left pelvic region associated with Hg drop 9>7 4/1 hgb stable, TEE, new tunneled line planned  Interim History / Subjective:   NAEON. On room air, no pressors, hgb stable  Objective   Blood pressure (!) 145/66, pulse 71, temperature 98.6 F (37 C), temperature source Oral, resp. rate 18, height 5\' 8"  (1.727 m), weight 43.9 kg, SpO2 100 %.        Intake/Output Summary (Last 24 hours) at 09/07/2022 1329 Last data filed at 09/07/2022 0908 Gross per 24 hour  Intake 490 ml  Output --  Net 490 ml    Filed Weights   09/05/22 0500 09/06/22 0500 09/07/22 0322  Weight: 57.3 kg 60.2 kg  43.9 kg   Physical Exam: General: Chronically ill-appearing, no acute distress HENT: [name], AT, OP clear, MMM Eyes: EOMI, no scleral icterus Respiratory: Clear to auscultation bilaterally.  No crackles, wheezing or rales Cardiovascular: RRR, -M/R/G, no JVD GI: BS+, soft, mild suprapubic tenderness/induration to palpation, no rebound abdominal pain. Minimal bloody drainage on left groin incision, no active bleed with no expression on palpation, nontender. Extremities:-Edema,-tenderness Neuro: AAO x2, CNII-XII grossly intact, LLE weakness, able to bend knee and wiggle toes. Sensation intact    Resolved Hospital Problem list   Anion gap metabolic acidosis with lactic acidosis, Hyperkalemia Acute respiratory failure 2nd to acidosis and uremia.   Assessment & Plan:  MRSA bacteremia 2/2 post-op wound infection --Continue Vanc. Following levels --Will need suppressive regimen after acute treatment --Appreciate ID involvement. Recommend line holiday x 72 hours starting 3/28 --MRI L neg --Bcx 3/27 NTD --TEE postponed until 4/1due to chest pain.   Lt common femoral artery pseudoaneurysm s/p Lt groin hematoma evacuation, primary repair of distal external iliac artery. --Postop care per vascular surgery. OK to DC heparin per team in setting of bleed  ESRD. --Line holiday 3/28  --Plan for tunnelled cath 4/1. Labs with no indication for dialysis today  Acute blood loss anemia - CT 3/30 with increased anterior pelvic extraperitoneal hematoma  Transient hemorrhagic shock - no pressors required Anemia of critical illness and chronic disease. --heparin d/c'd --S/p PRBC x 1 on 3/30. Improved Hg to 9 --Trend CBC --Discussed case  with Vascular and plan for medical management --If rebleeds, plan for CTA A/P  Chronic systolic CHF with EF 35 to 40% on Echo from 06/29/22. Hx of pulmonary hypertension. --Hold outpt toprol, asa, plavix  Acute metabolic encephalopathy from uremia, acidosis. Acute 72mm  right subinsular stroke  CT head on 3/24 was normal. --Further imaging and work-up per Neuro. Appreciate consult team's assistance  Hx of COPD. --On room air  Will remain in ICU. OK to transfer after tunneled cath and TEE.  Best Practice (right click and "Reselect all SmartList Selections" daily)   Diet/type: Regular consistency (see orders) DVT prophylaxis: systemic heparin  GI prophylaxis: PPI. Lines: N/A  Foley:  N/A Code Status:  full code Last date of multidisciplinary goals of care discussion [x]    Critical care time:   N/a  Lanier Clam, MD See Amion for contact info Please contact [phone] if no answer, contact E link after 7 pm

## 2022-09-07 NOTE — Plan of Care (Signed)
  Problem: Education: Goal: Understanding of CV disease, CV risk reduction, and recovery process will improve Outcome: Progressing Goal: Individualized Educational Video(s) Outcome: Progressing   Problem: Activity: Goal: Ability to return to baseline activity level will improve Outcome: Progressing   Problem: Cardiovascular: Goal: Ability to achieve and maintain adequate cardiovascular perfusion will improve Outcome: Progressing Goal: Vascular access site(s) Level 0-1 will be maintained Outcome: Progressing   Problem: Health Behavior/Discharge Planning: Goal: Ability to safely manage health-related needs after discharge will improve Outcome: Progressing   Problem: Education: Goal: Knowledge of prescribed regimen will improve Outcome: Progressing   Problem: Activity: Goal: Ability to tolerate increased activity will improve Outcome: Progressing   Problem: Bowel/Gastric: Goal: Gastrointestinal status for postoperative course will improve Outcome: Progressing   Problem: Clinical Measurements: Goal: Postoperative complications will be avoided or minimized Outcome: Progressing Goal: Signs and symptoms of graft occlusion will improve Outcome: Progressing   Problem: Skin Integrity: Goal: Demonstration of wound healing without infection will improve Outcome: Progressing   Problem: Education: Goal: Knowledge of General Education information will improve Description: Including pain rating scale, medication(s)/side effects and non-pharmacologic comfort measures Outcome: Progressing   Problem: Health Behavior/Discharge Planning: Goal: Ability to manage health-related needs will improve Outcome: Progressing   Problem: Clinical Measurements: Goal: Ability to maintain clinical measurements within normal limits will improve Outcome: Progressing Goal: Will remain free from infection Outcome: Progressing Goal: Diagnostic test results will improve Outcome: Progressing Goal:  Respiratory complications will improve Outcome: Progressing Goal: Cardiovascular complication will be avoided Outcome: Progressing   Problem: Activity: Goal: Risk for activity intolerance will decrease Outcome: Progressing   Problem: Nutrition: Goal: Adequate nutrition will be maintained Outcome: Progressing   Problem: Coping: Goal: Level of anxiety will decrease Outcome: Progressing   Problem: Elimination: Goal: Will not experience complications related to bowel motility Outcome: Progressing Goal: Will not experience complications related to urinary retention Outcome: Progressing   Problem: Pain Managment: Goal: General experience of comfort will improve Outcome: Progressing   Problem: Safety: Goal: Ability to remain free from injury will improve Outcome: Progressing   Problem: Skin Integrity: Goal: Risk for impaired skin integrity will decrease Outcome: Progressing   

## 2022-09-07 NOTE — Progress Notes (Signed)
Physical Therapy Treatment Patient Details Name: Albert Harris MRN: [id number] DOB: March 25, 1956 Today's Date: 09/07/2022   History of Present Illness 67 year old male  ER with abdominal pain, nausea, diaphoresis and unable to feel his Lt foot. Hypotensive with lactic acidosis in ER.  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 CRRT 3/22-3/26.  3/22 repair of distal Lt external iliac artery. On 3/30, Hgb drop from 9 to 7 with worsening abdominal pain, CT abd 3/30 shows slightly larger anterior pelvic extraperitoneal hematoma. PMHx: PAD, ESRD HD, recently hospitalized 3/16-18 for acute on chronic left lower extremity ischemia.    PT Comments    Pt reports significant LLE pain, worse than previous PT sessions. Pt reporting feeling weaker today, requires increased physical assist for bed mobility and attempted stands x2. Pt limited in standing tolerance given debility and LLE pain, requesting return to supine. PT to continue to progress pt as tolerated.     Recommendations for follow up therapy are one component of a multi-disciplinary discharge planning process, led by the attending physician.  Recommendations may be updated based on patient status, additional functional criteria and insurance authorization.  Follow Up Recommendations       Assistance Recommended at Discharge Frequent or constant Supervision/Assistance  Patient can return home with the following Assist for transportation;Two people to help with walking and/or transfers;Two people to help with bathing/dressing/bathroom;Assistance with cooking/housework;Help with stairs or ramp for entrance   Equipment Recommendations  Other (comment) (tbd)    Recommendations for Other Services       Precautions / Restrictions Precautions Precautions: Fall Precaution Comments: L groin dressing with bleeding from site (RN aware and to redress after session), sacral wound, stool incontinence Restrictions Weight  Bearing Restrictions: No     Mobility  Bed Mobility Overal bed mobility: Needs Assistance Bed Mobility: Supine to Sit, Sit to Supine Rolling: Mod assist, +2 for physical assistance Sidelying to sit: Mod assist, +2 for physical assistance   Sit to supine: Max assist, +2 for physical assistance   General bed mobility comments: assist for roll bilat for trunk and LE management. mod-max +2 for all aspects    Transfers Overall transfer level: Needs assistance Equipment used: Rolling walker (2 wheels) Transfers: Sit to/from Stand Sit to Stand: +2 physical assistance, +2 safety/equipment, Max assist           General transfer comment: max +2 for attempted rise to stand, x2 stand attempts. Pt limited by LLE pain and weakness, vss    Ambulation/Gait                   Stairs             Wheelchair Mobility    Modified Rankin (Stroke Patients Only)       Balance Overall balance assessment: Needs assistance Sitting-balance support: Feet supported Sitting balance-Leahy Scale: Fair     Standing balance support: Bilateral upper extremity supported, During functional activity, Reliant on assistive device for balance Standing balance-Leahy Scale: Poor                              Cognition Arousal/Alertness: Awake/alert Behavior During Therapy: WFL for tasks assessed/performed Overall Cognitive Status: Impaired/Different from baseline Area of Impairment: Following commands, Safety/judgement, Awareness, Attention, Problem solving                   Current Attention Level: Selective   Following Commands: Follows one  step commands consistently, Follows one step commands with increased time Safety/Judgement: Decreased awareness of deficits, Decreased awareness of safety Awareness: Emergent Problem Solving: Decreased initiation, Requires verbal cues, Difficulty sequencing, Slow processing General Comments: pt pleasant and per pt's daughter at  bedside is at his baseline affect. Pt requiring increased processing time for following commands, at times requires rephrasing/restating commands        Exercises      General Comments General comments (skin integrity, edema, etc.): vss. bleeding from L groin site, RN aware, bed pad change      Pertinent Vitals/Pain Pain Assessment Pain Assessment: Faces Faces Pain Scale: Hurts even more Pain Location: LLE Pain Descriptors / Indicators: Grimacing, Guarding Pain Intervention(s): Limited activity within patient's tolerance, Monitored during session, Repositioned    Home Living                          Prior Function            PT Goals (current goals can now be found in the care plan section) Acute Rehab PT Goals Patient Stated Goal: Pt wants to get better and go home. PT Goal Formulation: With patient/family Time For Goal Achievement: 09/14/22 Potential to Achieve Goals: Good Progress towards PT goals: Not progressing toward goals - comment (medical setbacks, debility)    Frequency    Min 3X/week      PT Plan Current plan remains appropriate    Co-evaluation PT/OT/SLP Co-Evaluation/Treatment: Yes Reason for Co-Treatment: Complexity of the patient's impairments (multi-system involvement);For patient/therapist safety;To address functional/ADL transfers PT goals addressed during session: Mobility/safety with mobility;Balance        AM-PAC PT "6 Clicks" Mobility   Outcome Measure  Help needed turning from your back to your side while in a flat bed without using bedrails?: A Lot Help needed moving from lying on your back to sitting on the side of a flat bed without using bedrails?: A Lot Help needed moving to and from a bed to a chair (including a wheelchair)?: Total Help needed standing up from a chair using your arms (e.g., wheelchair or bedside chair)?: A Lot Help needed to walk in hospital room?: Total Help needed climbing 3-5 steps with a railing?  : Total 6 Click Score: 9    End of Session Equipment Utilized During Treatment: Gait belt Activity Tolerance: Patient limited by fatigue Patient left: in bed;with call bell/phone within reach;with family/visitor present;with bed alarm set Nurse Communication: Mobility status PT Visit Diagnosis: Other abnormalities of gait and mobility (R26.89);Muscle weakness (generalized) (M62.81);Difficulty in walking, not elsewhere classified (R26.2)     Time: [id number] PT Time Calculation (min) (ACUTE ONLY): 23 min  Charges:  $Therapeutic Activity: 8-22 mins                     Stacie Glaze, PT DPT Acute Rehabilitation Services Pager [phone]  Office [phone]    Albert Harris 09/07/2022, 9:52 AM

## 2022-09-07 NOTE — Anesthesia Postprocedure Evaluation (Signed)
Anesthesia Post Note  Patient: Albert Harris  Procedure(s) Performed: TRANSESOPHAGEAL ECHOCARDIOGRAM (TEE)     Patient location during evaluation: PACU Anesthesia Type: MAC Level of consciousness: awake and alert Pain management: pain level controlled Vital Signs Assessment: post-procedure vital signs reviewed and stable Respiratory status: spontaneous breathing and respiratory function stable Cardiovascular status: stable Postop Assessment: no apparent nausea or vomiting Anesthetic complications: no  No notable events documented.  Last Vitals:  Vitals:   09/07/22 1445 09/07/22 1500  BP: (!) 128/55 123/60  Pulse: 81 82  Resp: 20 (!) 21  Temp:  37.3 C  SpO2: 95% 95%    Last Pain:  Vitals:   09/07/22 1430  TempSrc:   PainSc: 0-No pain                 Jaimya Feliciano DANIEL

## 2022-09-07 NOTE — Op Note (Signed)
    NAME: [name]    MRN: [id number] DOB: 03-12-1956    DATE OF OPERATION: 09/07/2022  PREOP DIAGNOSIS:    End stage renal disease  POSTOP DIAGNOSIS:    Same  PROCEDURE:    Right internal jugular 19cm tunneled dialysis catheter placement. 1  SURGEON: Broadus John  ASSIST: None  ANESTHESIA: General   EBL: 39ml  INDICATIONS:    Albert Harris is a 67 y.o. male with end stage renal diease, clotted AV graft in need of long term HD access after line holiday for bacteremia.   FINDINGS:    19cm Palindrome placement into the left internal jugular vein  TECHNIQUE:   Using ultrasound guidance the right internal jugular vein was accessed with micropuncture technique.  Through the micropuncture sheath a floppy J-wire was advanced into the superior vena cava.  A small incision was made around the skin access point.  A counterincision was made in the chest under the clavicle.  A 19 cm tunneled dialysis catheter was then tunneled under the skin, over the clavicle into the incision in the neck.  The access point was serially dilated under direct fluoroscopic guidance.  A peel-away sheath was introduced into the superior vena cava under fluoroscopic guidance.  The tunneling device was removed and the catheter fed through the peel-away sheath into the superior vena cava.  The peel-away sheath was removed and the catheter gently pulled back.  Adequate position was confirmed with x-ray.  The catheter was tested and found to flush and draw back well.  Catheter was heparin locked.  Caps were applied.  Catheter was sutured to the skin.  The neck incision was closed with 4-0 Monocryl.   [name], MD [facility] DATE OF DICTATION:   09/07/2022

## 2022-09-07 NOTE — Progress Notes (Signed)
Inpatient Rehab Admissions Coordinator:   Continuing to follow patient for possible CIR admission. He is not yet making enough progress to be considered at this time. Will watch for next couple of days.   Rehab Admissons Coordinator [place], [place], [place] [phone]

## 2022-09-07 NOTE — Progress Notes (Signed)
Nutrition Follow-up  DOCUMENTATION CODES:   Non-severe (moderate) malnutrition in context of chronic illness  INTERVENTION:  Liberalize diet to regular to provide maximum amount of dining options appetite is poor and excessive restrictions are not beneficial in the acute care setting  Continue renavite daily for increased micronutrient needs with HD Prosource Plus BID to provide 100kcal and 15g of protein Nepro Shake po BID, each supplement provides 425 kcal and 19 grams protein  NUTRITION DIAGNOSIS:  Moderate Malnutrition (in the context of chronic illness (ESRD)) related to poor appetite as evidenced by moderate fat depletion, severe muscle depletion. - remains applicable  GOAL:  Patient will meet greater than or equal to 90% of their needs - progressing, diet in place, supplements  MONITOR:   PO intake, Supplement acceptance, I & O's, Labs, Weight trends  REASON FOR ASSESSMENT:   Consult, Ventilator Enteral/tube feeding initiation and management (trickle tube feeds)  ASSESSMENT:   Pt admitted with abdominal pain, nausea, diaphoresis and unable to feel L foot d/t L femoral pseudoaneurysm. Pt was hypotensive with lactic acidosis upon admission. PMH significant for ESRD, PAD, COPD, HLD, HTN, HFrEF 35-40%.  3/22 - admit, emergent CRRT 3/23 - L fem artery repair 3/24 - extubated 3/26 - CRRT off 3/27 - Right arm AV loop graft embolectomy attempted but aborted for hemodynamic instability  3/29 - MRI shows right subinsular 40mm infarct  Pt being taken out of room for OR at the time of assessment. Poor intake noted in flowsheet. Pt has been intermittently NPO for procedures, several of which have been cancelled due to instability.   Worsening renal function due to missed HD because of no access. To OR for tunneled HD cath today and RN reports that pt is scheduled to have HD later.   If pt's appetite does not improve after procedures are finished, pt would benefit from cortrak  tube to support nutrition status. Weight loss noted this admission. Do not believe the degree of loss is accurate in such a short time frame.  Average Meal Intake: 3/27-3/31: 35% average intake x 7 recorded meals   Intake/Output Summary (Last 24 hours) at 09/07/2022 1313 Last data filed at 09/07/2022 0908 Gross per 24 hour  Intake 490 ml  Output --  Net 490 ml  Net IO Since Admission: 5,108.52 mL [09/07/22 1313]  Nutritionally Relevant Medications: Scheduled Meds:  PROSource Plus  30 mL Oral BID BM   NEPRO CARB STEADY  237 mL Oral BID BM   multivitamin  1 tablet Oral QHS   pantoprazole  40 mg Oral Daily   rosuvastatin  10 mg Oral Daily   vancomycin variable    Does not apply See admin instructions   PRN Meds: ondansetron, polyethylene glycol  Labs Reviewed: Na 129, Chloride 90 BUN 100, creatinine 8.7 Calcium corrects to 10.54 for low albumin Phosphorus 7.8  NUTRITION - FOCUSED PHYSICAL EXAM: Flowsheet Row Most Recent Value  Orbital Region Mild depletion  Upper Arm Region Moderate depletion  Thoracic and Lumbar Region Moderate depletion  Buccal Region Mild depletion  Temple Region No depletion  Clavicle Bone Region Mild depletion  Clavicle and Acromion Bone Region Moderate depletion  Scapular Bone Region Moderate depletion  Dorsal Hand Severe depletion  Patellar Region Severe depletion  Anterior Thigh Region Severe depletion  Posterior Calf Region Severe depletion  Edema (RD Assessment) None  Hair Reviewed  Eyes Reviewed  Mouth Reviewed  Skin Reviewed  Nails Reviewed    Diet Order:   Diet Order  Diet NPO time specified  Diet effective midnight                   EDUCATION NEEDS:  Education needs have been addressed  Skin: Skin Integrity Issues: Stage II: medial coccyx (1 cm x 0.5 cms x 0.1 cm) per WOC Deep Tissue Injury: dark maroon purple discoloration covering sacrum, coccyx and bilateral buttocks (15 cm x 7 cm per WOC) Incisions: L groin  (closed)  Last BM:  3/31 - type 6  Height:  Ht Readings from Last 1 Encounters:  08/29/22 5\' 8"  (1.727 m)    Weight:   Wt Readings from Last 1 Encounters:  09/07/22 43.9 kg    Ideal Body Weight:  70 kg  BMI:  Body mass index is 14.72 kg/m.  Estimated Nutritional Needs:  Kcal:  1900-2100 Protein:  95-110g/d Fluid:  1L + UOP   Ranell Patrick, RD, LDN Clinical Dietitian RD pager # available in [facility]  After hours/weekend pager # available in [facility]

## 2022-09-07 NOTE — Anesthesia Procedure Notes (Signed)
Procedure Name: LMA Insertion Date/Time: 09/07/2022 1:25 PM  Performed by: Thelma Comp, CRNAPre-anesthesia Checklist: Patient identified, Emergency Drugs available, Suction available and Patient being monitored Patient Re-evaluated:Patient Re-evaluated prior to induction Oxygen Delivery Method: Circle System Utilized Preoxygenation: Pre-oxygenation with 100% oxygen Induction Type: IV induction Ventilation: Mask ventilation without difficulty LMA: LMA inserted LMA Size: 4.0 Number of attempts: 1 Placement Confirmation: positive ETCO2 Tube secured with: Tape Dental Injury: Teeth and Oropharynx as per pre-operative assessment  Comments: Performed by Eual Fines, SRNA

## 2022-09-07 NOTE — Progress Notes (Signed)
[facility] Progress Note  Subjective: still having some L groin bleeding. Have d/w family at bedside.   Vitals:   09/07/22 1250 09/07/22 1300 09/07/22 1305 09/07/22 1430  BP: (!) 147/63 (!) 145/66 (!) 145/66 124/61  Pulse: 72 71 71 79  Resp: 20 19 18 17   Temp:    99.2 F (37.3 C)  TempSrc:      SpO2: 99% 100% 100% 96%  Weight:      Height:        Exam: Constitutional: lying in bed, intermittent distress, hiccups ENMT: ears and nose without scars or lesions, MMM CV: normal rate, no edema Respiratory: bilateral chest rise, no iwob Gastrointestinal: soft, nonended Skin: no visible lesions or rashes Psych: mood and affect appropriate  Access: Right upper extremity AVG, no bruit or thrill    OP HD: TTS SW  4h   350/800  edw pending    RFA AVG   Heparin none    Assessment/ Plan: Left common femoral artery pseudoaneurysm: Status post surgery with Dr. Unk Lightning on the morning of 08/29/2022 status post repair.  Per [facility].  ESRD: TTS and [place] via RUE AVG.  SP CRRT 3/23- 3/26.  Received regular dialysis morning of 09/03/2022.  Plan for HD today after [facility] placement, then next HD Tuesday or Wed CVA: stroke on MRI. Neuro following Shock/MRSA Bacteremia: Likely multifactorial with infectious component. MRSA treatment per primary.  Shock resolved. TEE when able Anemia: Hb 8-10 range. Likely multifactorial.  No IV iron given bacteremia. ESA ordered Dialysis access: AVG clotted attempted declot on 3/27 but unsuccessful. Temp cath removed 3/28. Plan for [facility] today per IR.    Kelly Splinter MD [facility] 09/07/2022, 2:33 PM  Recent Labs  Lab 09/06/22 0608 09/06/22 1435 09/07/22 0233 09/07/22 0434  HGB 9.2* 8.6* 8.5*  --   ALBUMIN 2.0*  --   --  1.7*  CALCIUM 8.5*  --   --  8.3*  PHOS 6.3*  --   --  7.8*  CREATININE 7.28*  --   --  8.70*  K 4.3  --   --  4.5   No results for input(s): "IRON", "TIBC", "FERRITIN" in the last 168 hours. Inpatient medications:  [MAR Hold] (feeding  supplement) PROSource Plus  30 mL Oral BID BM   [MAR Hold] Chlorhexidine Gluconate Cloth  6 each Topical Q0600   [MAR Hold] darbepoetin (ARANESP) injection - DIALYSIS  100 mcg Subcutaneous Q Wed-1800   [MAR Hold] feeding supplement (NEPRO CARB STEADY)  237 mL Oral BID BM   [MAR Hold] heparin injection (subcutaneous)  5,000 Units Subcutaneous Q8H   [MAR Hold] multivitamin  1 tablet Oral QHS   [MAR Hold] pantoprazole  40 mg Oral Daily   [MAR Hold] rosuvastatin  10 mg Oral Daily   [MAR Hold] sodium chloride flush  10-40 mL Intracatheter Q12H   [MAR Hold] tetracaine  100 mg Infiltration Once   [MAR Hold] vancomycin variable dose per unstable renal function (pharmacist dosing)   Does not apply See admin instructions    [MAR Hold] sodium chloride     sodium chloride Stopped (09/01/22 0400)   sodium chloride     sodium chloride 10 mL/hr at 09/07/22 1312   Place/Maintain arterial line **AND** [MAR Hold] sodium chloride, [MAR Hold] acetaminophen **OR** [MAR Hold] acetaminophen, [MAR Hold] alteplase, amisulpride, [MAR Hold] docusate sodium, fentaNYL (SUBLIMAZE) injection, [MAR Hold] heparin, [MAR Hold] hydrALAZINE, [MAR Hold]  HYDROmorphone (DILAUDID) injection, [MAR Hold] ondansetron (ZOFRAN) IV, [MAR Hold] mouth rinse, [  MAR Hold] oxyCODONE, [MAR Hold] pentafluoroprop-tetrafluoroeth, [MAR Hold] polyethylene glycol, promethazine, [MAR Hold] sodium chloride flush

## 2022-09-07 NOTE — Interval H&P Note (Signed)
History and Physical Interval Note:  09/07/2022 8:28 AM  Albert Harris  has presented today for surgery, with the diagnosis of BACTERIMIA.  The various methods of treatment have been discussed with the patient and family. After consideration of risks, benefits and other options for treatment, the patient has consented to  Procedure(s): TRANSESOPHAGEAL ECHOCARDIOGRAM (TEE) (N/A) as a surgical intervention.  [name] history has been reviewed, patient examined, no change in status, stable for surgery.  I have reviewed the patient's chart and labs.  Questions were answered to the patient's satisfaction.     Jenkins Rouge

## 2022-09-07 NOTE — Transfer of Care (Signed)
Immediate Anesthesia Transfer of Care Note  Patient: Albert Harris  Procedure(s) Performed: INSERTION OF TUNNELED DIALYSIS CATHETER TRANSESOPHAGEAL ECHOCARDIOGRAM  Patient Location: Endoscopy Unit  Anesthesia Type:MAC  Level of Consciousness: drowsy  Airway & Oxygen Therapy: Patient Spontanous Breathing  Post-op Assessment: Report given to RN and Post -op Vital signs reviewed and stable  Post vital signs: Reviewed and stable  Last Vitals:  Vitals Value Taken Time  BP 110/56   Temp 98   Pulse 66 09/07/22 1229  Resp 21 09/07/22 1229  SpO2 100 % 09/07/22 1229  Vitals shown include unvalidated device data.  Last Pain:  Vitals:   09/07/22 1123  TempSrc: Oral  PainSc: 5       Patients Stated Pain Goal: 4 ([id number])  Complications: No notable events documented.

## 2022-09-07 NOTE — Progress Notes (Addendum)
  Progress Note    09/07/2022 7:57 AM 5 Days Post-Op  Subjective:  no complaints this morning   Vitals:   09/07/22 0700 09/07/22 0726  BP: (!) 124/59   Pulse: 70   Resp: 19   Temp:  97.9 F (36.6 C)  SpO2: 100%    Physical Exam: Cardiac:  regular Lungs:  non labored Incisions:  left groin incision is intact. Dry dressings in place. SS drainage on dressings. Soft around. Staples intact. Right forearm incision c/d/i Extremities:  well perfused and warm. Doppler left DP/PT signals. Right hand warm and well perfused 2+ radial pulse Abdomen:  soft Neurologic: alert and oriented  CBC    Component Value Date/Time   WBC 24.4 (H) 09/07/2022 0233   RBC 2.77 (L) 09/07/2022 0233   HGB 8.5 (L) 09/07/2022 0233   HGB 10.4 (L) 06/18/2021 1431   HCT 26.5 (L) 09/07/2022 0233   HCT 31.6 (L) 06/18/2021 1431   PLT 236 09/07/2022 0233   PLT 142 (L) 06/18/2021 1431   MCV 95.7 09/07/2022 0233   MCV 96 06/18/2021 1431   MCH 30.7 09/07/2022 0233   MCHC 32.1 09/07/2022 0233   RDW 19.0 (H) 09/07/2022 0233   RDW 14.3 06/18/2021 1431   LYMPHSABS 1.5 08/28/2022 1810   LYMPHSABS 1.5 06/18/2021 1431   MONOABS 0.7 08/28/2022 1810   EOSABS 0.1 08/28/2022 1810   EOSABS 1.0 (H) 06/18/2021 1431   BASOSABS 0.1 08/28/2022 1810   BASOSABS 0.1 06/18/2021 1431    BMET    Component Value Date/Time   NA 129 (L) 09/07/2022 0434   NA 141 06/18/2021 1431   K 4.5 09/07/2022 0434   CL 90 (L) 09/07/2022 0434   CO2 19 (L) 09/07/2022 0434   GLUCOSE 98 09/07/2022 0434   BUN 100 (H) 09/07/2022 0434   BUN 20 06/18/2021 1431   CREATININE 8.70 (H) 09/07/2022 0434   CREATININE 1.15 02/19/2017 1049   CALCIUM 8.3 (L) 09/07/2022 0434   CALCIUM 8.5 (L) 06/10/2020 0900   GFRNONAA 6 (L) 09/07/2022 0434   GFRAA 6 (L) 03/04/2020 1045    INR    Component Value Date/Time   INR 1.2 08/12/2022 1501     Intake/Output Summary (Last 24 hours) at 09/07/2022 0757 Last data filed at 09/07/2022 0400 Gross per 24 hour   Intake 690 ml  Output --  Net 690 ml     Assessment/Plan:  67 y.o. male is s/p L femoral exploration and pseudoaneurysm repair 10 Days post.  Left groin stable appearing. Staples intact.  Incisional VAC now off. Dry dressings applied. Some SS drainage LLE remains well perfused and warm with Doppler DP/ PT signals H&H stable WBC up slightly to 24k. Afebrile Remains on Vancomycin for MRSA per ID Right forearm AVG occluded following attempted thrombectomy Plan is for [facility] [place] in OR today Keep NPO   Marval Regal [facility] [phone] 09/07/2022 7:57 AM  VASCULAR STAFF ADDENDUM: I have independently interviewed and examined the patient. I agree with the above.  Plan for [facility] today. Will get TEE while intra-op.  After discussing the above, [place] elected to proceed.  Hct stable   Cassandria Santee, MD [facility] Phone Number: [phone] 09/07/2022 11:39 AM

## 2022-09-07 NOTE — CV Procedure (Signed)
TEE: Anesthesia: Propofol  EF 50-55% No LAA thrombus No SBE/vegetations Mild MR Mild AS Mild TR Trivial PR Bi atrial enlargement  Normal RV No PFO/ASD  Jenkins Rouge MD [facility]

## 2022-09-07 NOTE — Progress Notes (Signed)
Pre Hemodialysis   09/07/22 2245  Vitals  Temp 98 F (36.7 C)  Pulse Rate 73  Resp 18  BP (!) 144/71  SpO2 97 %  O2 Device Room Air  Weight 43.9 kg  Type of Weight Pre-Dialysis  Oxygen Therapy  Pulse Oximetry Type Continuous  Oximetry Probe Site Changed No  Pre Treatment  Vascular access used during treatment Catheter  HD catheter dressing before treatment WDL  Patient is receiving dialysis in a chair No  Hemodialysis Consent Verified Yes  Hemodialysis Standing Orders Initiated Yes  ECG (Telemetry) Monitor On Yes  Prime Ordered Normal Saline  Length of  DialysisTreatment -hour(s) 3.5 Hour(s)  Dialysis mode HD  Dialyzer Revaclear 400  Dialysate 3K  Dialysis Anticoagulation Automated NS Flushes  Dialysate Flow Ordered 300  Blood Flow Rate Ordered 400 mL/min  Ultrafiltration Goal 3000 Liters  Dialysis Blood Pressure Support Ordered Normal Saline

## 2022-09-08 ENCOUNTER — Encounter (HOSPITAL_COMMUNITY): Payer: Self-pay | Admitting: Cardiovascular Disease

## 2022-09-08 DIAGNOSIS — I739 Peripheral vascular disease, unspecified: Secondary | ICD-10-CM | POA: Diagnosis not present

## 2022-09-08 DIAGNOSIS — I998 Other disorder of circulatory system: Secondary | ICD-10-CM | POA: Diagnosis not present

## 2022-09-08 DIAGNOSIS — I724 Aneurysm of artery of lower extremity: Secondary | ICD-10-CM | POA: Diagnosis not present

## 2022-09-08 DIAGNOSIS — M79605 Pain in left leg: Secondary | ICD-10-CM | POA: Diagnosis not present

## 2022-09-08 DIAGNOSIS — L8990 Pressure ulcer of unspecified site, unspecified stage: Secondary | ICD-10-CM | POA: Insufficient documentation

## 2022-09-08 DIAGNOSIS — R6521 Severe sepsis with septic shock: Secondary | ICD-10-CM | POA: Diagnosis not present

## 2022-09-08 DIAGNOSIS — A4102 Sepsis due to Methicillin resistant Staphylococcus aureus: Secondary | ICD-10-CM

## 2022-09-08 DIAGNOSIS — N186 End stage renal disease: Secondary | ICD-10-CM | POA: Diagnosis not present

## 2022-09-08 DIAGNOSIS — A419 Sepsis, unspecified organism: Secondary | ICD-10-CM | POA: Diagnosis not present

## 2022-09-08 DIAGNOSIS — E872 Acidosis, unspecified: Secondary | ICD-10-CM | POA: Diagnosis not present

## 2022-09-08 LAB — AEROBIC/ANAEROBIC CULTURE W GRAM STAIN (SURGICAL/DEEP WOUND)
Culture: NO GROWTH
Gram Stain: NONE SEEN

## 2022-09-08 LAB — CBC
HCT: 29.9 % — ABNORMAL LOW (ref 39.0–52.0)
Hemoglobin: 10 g/dL — ABNORMAL LOW (ref 13.0–17.0)
MCH: 31.2 pg (ref 26.0–34.0)
MCHC: 33.4 g/dL (ref 30.0–36.0)
MCV: 93.1 fL (ref 80.0–100.0)
Platelets: 231 10*3/uL (ref 150–400)
RBC: 3.21 MIL/uL — ABNORMAL LOW (ref 4.22–5.81)
RDW: 18.7 % — ABNORMAL HIGH (ref 11.5–15.5)
WBC: 29 10*3/uL — ABNORMAL HIGH (ref 4.0–10.5)
nRBC: 0 % (ref 0.0–0.2)

## 2022-09-08 LAB — RENAL FUNCTION PANEL
Albumin: 2.1 g/dL — ABNORMAL LOW (ref 3.5–5.0)
Anion gap: 16 — ABNORMAL HIGH (ref 5–15)
BUN: 43 mg/dL — ABNORMAL HIGH (ref 8–23)
CO2: 23 mmol/L (ref 22–32)
Calcium: 8.3 mg/dL — ABNORMAL LOW (ref 8.9–10.3)
Chloride: 95 mmol/L — ABNORMAL LOW (ref 98–111)
Creatinine, Ser: 4.77 mg/dL — ABNORMAL HIGH (ref 0.61–1.24)
GFR, Estimated: 13 mL/min — ABNORMAL LOW (ref >60)
Glucose, Bld: 235 mg/dL — ABNORMAL HIGH (ref 70–99)
Phosphorus: 4.4 mg/dL (ref 2.5–4.6)
Potassium: 4 mmol/L (ref 3.5–5.1)
Sodium: 134 mmol/L — ABNORMAL LOW (ref 135–145)

## 2022-09-08 MED ORDER — CHLORHEXIDINE GLUCONATE CLOTH 2 % EX PADS
6.0000 | MEDICATED_PAD | Freq: Every day | CUTANEOUS | Status: DC
  Administered 2022-09-09: 6 via TOPICAL

## 2022-09-08 MED ORDER — VANCOMYCIN HCL 750 MG/150ML IV SOLN
750.0000 mg | Freq: Once | INTRAVENOUS | Status: AC
  Administered 2022-09-08: 750 mg via INTRAVENOUS
  Filled 2022-09-08: qty 150

## 2022-09-08 NOTE — Progress Notes (Signed)
STROKE TEAM PROGRESS NOTE   SUBJECTIVE (INTERVAL HISTORY) His daughter is at the bedside. Pt lying in bed, stated that leg pain getting better. Right leg pain much improved. Left leg pain still feels tender at calf area but also improving. However, when I tried to flexed his knee passively, he reported intense pain at left calf and popliteal area.   Pt had HD catheter yesterday and had HD yesterday too. Vascular surgery Dr. Virl Cagey on board, plan for CTA abd / pelvis with run off. Also plan for left groin washout in am.    OBJECTIVE Temp:  [97.8 F (36.6 C)-99.2 F (37.3 C)] 97.8 F (36.6 C) (04/02 0740) Pulse Rate:  [73-99] 81 (04/02 0900) Cardiac Rhythm: Normal sinus rhythm;Other (Comment) (04/02 1009) Resp:  [16-28] 28 (04/02 0800) BP: (74-156)/(42-106) 120/66 (04/02 0900) SpO2:  [94 %-100 %] 98 % (04/02 0900) Weight:  [43.9 kg] 43.9 kg (04/01 2245)  No results for input(s): "GLUCAP" in the last 168 hours.  Recent Labs  Lab 09/02/22 0424 09/02/22 1505 09/04/22 0838 09/05/22 0053 09/06/22 0608 09/07/22 0434 09/08/22 0329  NA 132*   < > 134* 131* 133* 129* 134*  K 4.2   < > 3.7 3.8 4.3 4.5 4.0  CL 98   < > 94* 95* 96* 90* 95*  CO2 21*   < > 24 21* 19* 19* 23  GLUCOSE 102*   < > 90 100* 92 98 235*  BUN 59*   < > 48* 61* 81* 100* 43*  CREATININE 3.92*   < > 4.21* 5.41* 7.28* 8.70* 4.77*  CALCIUM 8.2*   < > 8.7* 8.3* 8.5* 8.3* 8.3*  MG 2.7*  --  2.3  --   --  2.4  --   PHOS 3.2   < > 4.2 4.7* 6.3* 7.8* 4.4   < > = values in this interval not displayed.   Recent Labs  Lab 09/04/22 0838 09/05/22 0053 09/06/22 0608 09/07/22 0434 09/08/22 0329  ALBUMIN 2.2* 1.9* 2.0* 1.7* 2.1*   Recent Labs  Lab 09/06/22 0608 09/06/22 1435 09/07/22 0233 09/07/22 1535 09/08/22 0329  WBC 19.8* 24.3* 24.4* 29.8* 29.0*  HGB 9.2* 8.6* 8.5* 9.2* 10.0*  HCT 27.7* 26.6* 26.5* 27.7* 29.9*  MCV 92.3 94.7 95.7 93.0 93.1  PLT 189 202 236 234 231   No results for input(s): "CKTOTAL",  "CKMB", "CKMBINDEX", "TROPONINI" in the last 168 hours. No results for input(s): "LABPROT", "INR" in the last 72 hours. No results for input(s): "COLORURINE", "LABSPEC", "PHURINE", "GLUCOSEU", "HGBUR", "BILIRUBINUR", "KETONESUR", "PROTEINUR", "UROBILINOGEN", "NITRITE", "LEUKOCYTESUR" in the last 72 hours.  Invalid input(s): "APPERANCEUR"     Component Value Date/Time   CHOL 94 09/05/2022 1405   CHOL 154 11/27/2020 1011   TRIG 102 09/05/2022 1405   HDL 29 (L) 09/05/2022 1405   HDL 53 11/27/2020 1011   CHOLHDL 3.2 09/05/2022 1405   VLDL 20 09/05/2022 1405   LDLCALC 45 09/05/2022 1405   LDLCALC 89 11/27/2020 1011   Lab Results  Component Value Date   HGBA1C 5.1 09/05/2022      Component Value Date/Time   LABOPIA NONE DETECTED 07/18/2019 0603   COCAINSCRNUR NONE DETECTED 07/18/2019 0603   LABBENZ NONE DETECTED 07/18/2019 0603   AMPHETMU NONE DETECTED 07/18/2019 0603   THCU NONE DETECTED 07/18/2019 0603   LABBARB NONE DETECTED 07/18/2019 0603    No results for input(s): "ETH" in the last 168 hours.  I have personally reviewed the radiological images below and agree with the  radiology interpretations.  DG C-Arm 1-60 Min  Result Date: 09/07/2022 CLINICAL DATA:  Surgery.  Dialysis catheter EXAM: DG C-ARM 1-60 MIN CONTRAST:  None FLUOROSCOPY: Fluoroscopy Time:  29 seconds Radiation Exposure Index (if provided by the fluoroscopic device): 4.24 mGy Number of Acquired Spot Images: 1 COMPARISON:  None Available. FINDINGS: Catheter in place with indwelling wire. Imaging was obtained to aid in treatment IMPRESSION: Intraoperative fluoroscopy Electronically Signed   By: Jill Side M.D.   On: 09/07/2022 15:53   EP STUDY  Result Date: 09/07/2022 See surgical note for result.  ECHO TEE  Result Date: 09/07/2022    TRANSESOPHOGEAL ECHO REPORT   Patient Name:   Albert Harris Date of Exam: 09/07/2022 Medical Rec #:  [id number]      Height:       68.0 in Accession #:    [id number]     Weight:        96.8 lb Date of Birth:  09-05-1955     BSA:          1.501 m Patient Age:    64 years       BP:           148/70 mmHg Patient Gender: M              HR:           75 bpm. Exam Location:  Inpatient Procedure: Transesophageal Echo Indications:    Bacteremia  History:        Patient has prior history of Echocardiogram examinations.                 Signs/Symptoms:Bacteremia.  Sonographer:    [facility] Referring Phys: Geronimo: The transesophogeal probe was passed without difficulty through the esophogus of the patient. Sedation performed by different physician. The patient developed no complications during the procedure.  IMPRESSIONS  1. No vegetations or evidence of SBE.  2. Left ventricular ejection fraction, by estimation, is 50 to 55%. The left ventricle has low normal function. There is severe left ventricular hypertrophy.  3. Right ventricular systolic function is normal. The right ventricular size is normal.  4. Left atrial size was moderately dilated. No left atrial/left atrial appendage thrombus was detected.  5. Right atrial size was moderately dilated.  6. The mitral valve is abnormal. Mild mitral valve regurgitation.  7. The aortic valve is tricuspid. There is moderate calcification of the aortic valve. There is moderate thickening of the aortic valve. Aortic valve regurgitation is not visualized. Mild aortic valve stenosis. FINDINGS  Left Ventricle: Left ventricular ejection fraction, by estimation, is 50 to 55%. The left ventricle has low normal function. The left ventricular internal cavity size was normal in size. There is severe left ventricular hypertrophy. Right Ventricle: The right ventricular size is normal. Right vetricular wall thickness was not assessed. Right ventricular systolic function is normal. Left Atrium: Left atrial size was moderately dilated. No left atrial/left atrial appendage thrombus was detected. Right Atrium: Right atrial size was moderately dilated. Pericardium:  There is no evidence of pericardial effusion. Mitral Valve: The mitral valve is abnormal. There is mild thickening of the mitral valve leaflet(s). Mild mitral annular calcification. Mild mitral valve regurgitation. Tricuspid Valve: The tricuspid valve is normal in structure. Tricuspid valve regurgitation is mild. Aortic Valve: The aortic valve is tricuspid. There is moderate calcification of the aortic valve. There is moderate thickening of the aortic valve. Aortic valve regurgitation is not visualized. Mild aortic stenosis  is present. Aortic valve mean gradient measures 9.0 mmHg. Aortic valve peak gradient measures 17.1 mmHg. Aortic valve area, by VTI measures 1.29 cm. Pulmonic Valve: The pulmonic valve was normal in structure. Pulmonic valve regurgitation is trivial. Aorta: The aortic root is normal in size and structure. IAS/Shunts: No atrial level shunt detected by color flow Doppler. Additional Comments: No vegetations or evidence of SBE. LEFT VENTRICLE PLAX 2D LVOT diam:     1.70 cm LV SV:         48 LV SV Index:   32 LVOT Area:     2.27 cm  AORTIC VALVE AV Area (Vmax):    1.45 cm AV Area (Vmean):   1.32 cm AV Area (VTI):     1.29 cm AV Vmax:           207.00 cm/s AV Vmean:          139.000 cm/s AV VTI:            0.376 m AV Peak Grad:      17.1 mmHg AV Mean Grad:      9.0 mmHg LVOT Vmax:         132.00 cm/s LVOT Vmean:        81.000 cm/s LVOT VTI:          0.213 m LVOT/AV VTI ratio: 0.57  SHUNTS Systemic VTI:  0.21 m Systemic Diam: 1.70 cm Jenkins Rouge MD Electronically signed by Jenkins Rouge MD Signature Date/Time: 09/07/2022/12:28:06 PM    Final    VAS US CAROTID  Result Date: 09/07/2022 Carotid Arterial Duplex Study Patient Name:  Albert Harris  Date of Exam:   09/05/2022 Medical Rec #: [id number]       Accession #:    [id number] Date of Birth: June 13, 1955      Patient Gender: M Patient Age:   55 years Exam Location:  [facility] Procedure:      VAS US CAROTID Referring Phys: Alferd Patee [facility]  --------------------------------------------------------------------------------  Indications:       CVA. Risk Factors:      Hypertension, hyperlipidemia, past history of smoking, PAD. Other Factors:     ESRD on dialysis. Limitations        Today's exam was limited due to Patient condition.                    Somnolence. and heavy calcification and the resulting                    shadowing. Comparison Study:  Prior carotid duplex done 10/02/19 Performing Technologist: Sharion Dove RVS  Examination Guidelines: A complete evaluation includes B-mode imaging, spectral Doppler, color Doppler, and power Doppler as needed of all accessible portions of each vessel. Bilateral testing is considered an integral part of a complete examination. Limited examinations for reoccurring indications may be performed as noted.  Right Carotid Findings: +----------+--------+--------+--------+----------------------+--------+           PSV cm/sEDV cm/sStenosisPlaque Description    Comments +----------+--------+--------+--------+----------------------+--------+ CCA Prox  112     17              heterogenous                   +----------+--------+--------+--------+----------------------+--------+ CCA Distal122     14              heterogenous                   +----------+--------+--------+--------+----------------------+--------+  ICA Prox  215     29      40-59%  calcific and irregulartortuous +----------+--------+--------+--------+----------------------+--------+ ICA Mid   126     22              calcific                       +----------+--------+--------+--------+----------------------+--------+ ICA Distal154     26                                             +----------+--------+--------+--------+----------------------+--------+ ECA       47      3                                              +----------+--------+--------+--------+----------------------+--------+  +----------+--------+-------+------------+-------------------+           PSV cm/sEDV cmsDescribe    Arm Pressure (mmHG) +----------+--------+-------+------------+-------------------+ Subclavian               Not assessed                    +----------+--------+-------+------------+-------------------+ +---------+--------+--------+------------+ VertebralPSV cm/sEDV cm/sNot assessed +---------+--------+--------+------------+  Left Carotid Findings: +----------+--------+--------+--------+----------------------+--------+           PSV cm/sEDV cm/sStenosisPlaque Description    Comments +----------+--------+--------+--------+----------------------+--------+ CCA Prox  99      13              calcific                       +----------+--------+--------+--------+----------------------+--------+ CCA Mid   117     13              calcific                       +----------+--------+--------+--------+----------------------+--------+ CCA Distal111     12              calcific                       +----------+--------+--------+--------+----------------------+--------+ ICA Prox  199     24      40-59%  calcific and irregular         +----------+--------+--------+--------+----------------------+--------+ ICA Mid   194     28                                             +----------+--------+--------+--------+----------------------+--------+ ICA Distal69      11                                             +----------+--------+--------+--------+----------------------+--------+ ECA       271     16              calcific                       +----------+--------+--------+--------+----------------------+--------+ +----------+--------+--------+--------+-------------------+  PSV cm/sEDV cm/sDescribeArm Pressure (mmHG) +----------+--------+--------+--------+-------------------+ [id number]                                          +----------+--------+--------+--------+-------------------+ +---------+--------+--+--------+--+---------+ VertebralPSV cm/s83EDV cm/s16Antegrade +---------+--------+--+--------+--+---------+   Summary: Right Carotid: Velocities in the right ICA are consistent with a 40-59%                stenosis, no significant change since prior study. Left Carotid: Velocities in the left ICA are consistent with a 40-59% stenosis,               no significant change since prior study. Vertebrals:  Left vertebral artery demonstrates antegrade flow. Subclavians: Normal flow hemodynamics were seen in the left subclavian artery. *See table(s) above for measurements and observations.  Electronically signed by Antony Contras MD on 09/07/2022 at 8:06:03 AM.    Final    VAS [place] LOWER EXTREMITY ARTERIAL DUPLEX  Result Date: 09/05/2022 LOWER EXTREMITY ARTERIAL DUPLEX STUDY Patient Name:  Albert Harris  Date of Exam:   09/05/2022 Medical Rec #: [id number]       Accession #:    [id number] Date of Birth: 1955/12/11      Patient Gender: M Patient Age:   62 years Exam Location:  [facility] Procedure:      VAS [place] LOWER EXTREMITY ARTERIAL DUPLEX Referring Phys: Cornelius Moras Quavon Keisling --------------------------------------------------------------------------------  Indications: Peripheral artery disease, and Acute pain in lower back and left              leg. Anterior pelvic extraperitoneal hematoma within the prevesical              space. Right > left extension of hemorrhage within the              extraperitoneal lateral pelvic walls by CT today. High Risk Factors: Hypertension, hyperlipidemia, prior CVA. Other Factors: ESRD on dialysis,.  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 Current ABI:            n/a Comparison Study: Prior duplex of right fem-pop bypass done 08/06/22 indicating                    patent bypass Performing Technologist: Sharion Dove RVS  Examination Guidelines: A complete evaluation includes B-mode imaging, spectral Doppler, color Doppler, and power Doppler as needed of all accessible portions of each vessel. Bilateral testing is considered an integral part of a complete examination. Limited examinations for reoccurring indications may be performed as noted.  +----------+--------+-----+---------------+--------+--------+ RIGHT     PSV cm/sRatioStenosis       WaveformComments +----------+--------+-----+---------------+--------+--------+ ATA Mid   169          30-49% stenosis                 +----------+--------+-----+---------------+--------+--------+ ATA Distal147                                          +----------+--------+-----+---------------+--------+--------+  PTA Prox  114                                          +----------+--------+-----+---------------+--------+--------+ PTA Mid   116                                          +----------+--------+-----+---------------+--------+--------+ PTA Distal154          30-49% stenosis                 +----------+--------+-----+---------------+--------+--------+ DP        0                                            +----------+--------+-----+---------------+--------+--------+  Right Graft #1: Fem-Pop +------------------+--------+--------+----------+-----------+                   PSV cm/sStenosisWaveform  Comments    +------------------+--------+--------+----------+-----------+ Inflow            130             monophasic            +------------------+--------+--------+----------+-----------+ Prox Anastomosis  110                       multiphasic +------------------+--------+--------+----------+-----------+ Proximal Graft    99                                    +------------------+--------+--------+----------+-----------+ Mid Graft         88                                     +------------------+--------+--------+----------+-----------+ Distal Graft      86                                    +------------------+--------+--------+----------+-----------+ Distal Anastomosis116                                   +------------------+--------+--------+----------+-----------+ Outflow                                                 +------------------+--------+--------+----------+-----------+   +-----------+--------+-----+--------+-------------------+-----------------+ LEFT       PSV cm/sRatioStenosisWaveform           Comments          +-----------+--------+-----+--------+-------------------+-----------------+ CFA Prox   88                   monophasic                           +-----------+--------+-----+--------+-------------------+-----------------+ DFA        82  monophasic                           +-----------+--------+-----+--------+-------------------+-----------------+ SFA Prox                occluded                                     +-----------+--------+-----+--------+-------------------+-----------------+ SFA Mid                 occluded                                     +-----------+--------+-----+--------+-------------------+-----------------+ SFA Distal                      monophasic         collateral flow   +-----------+--------+-----+--------+-------------------+-----------------+ POP Prox   34                   monophasic                           +-----------+--------+-----+--------+-------------------+-----------------+ POP Distal 33                   monophasic                           +-----------+--------+-----+--------+-------------------+-----------------+ ATA Prox   23                   dampened monophasic                  +-----------+--------+-----+--------+-------------------+-----------------+ ATA Mid    26                   dampened  monophasiccollaterals noted +-----------+--------+-----+--------+-------------------+-----------------+ ATA Distal                      dampened monophasic                  +-----------+--------+-----+--------+-------------------+-----------------+ PTA Prox   26                   dampened monophasic                  +-----------+--------+-----+--------+-------------------+-----------------+ PTA Mid    26                   dampened monophasic                  +-----------+--------+-----+--------+-------------------+-----------------+ PTA Distal 28                   dampened monophasic                  +-----------+--------+-----+--------+-------------------+-----------------+ PERO Prox  20                   dampened monophasic                  +-----------+--------+-----+--------+-------------------+-----------------+ PERO Mid   34                   dampened monophasic                  +-----------+--------+-----+--------+-------------------+-----------------+  PERO Distal29                   dampened monophasiccollateral flow   +-----------+--------+-----+--------+-------------------+-----------------+ DP                              not visualized                       +-----------+--------+-----+--------+-------------------+-----------------+  Summary: Right: No significant change compared to previous study. Patent fem-pop bypass graft. 30-49% stenosis noted in the mid ATA and distal PTA. Left: Chronically occluded SFA. Monophasic and dampened monophasic flow noted in the popliteal, posterior tibial, anterior tibial, and peroneal arteries. Collateral flow noted in the distal SFA, mid ATA, and distal peroneal.  See table(s) above for measurements and observations. Electronically signed by Harold Barban MD on 09/05/2022 at 9:11:41 PM.    Final    CT ABDOMEN PELVIS WO CONTRAST  Result Date: 09/05/2022 CLINICAL DATA:  Rule out retroperitoneal bleed. EXAM: CT  ABDOMEN AND PELVIS WITHOUT CONTRAST TECHNIQUE: Multidetector CT imaging of the abdomen and pelvis was performed following the standard protocol without IV contrast. RADIATION DOSE REDUCTION: This exam was performed according to the departmental dose-optimization program which includes automated exposure control, adjustment of the mA and/or kV according to patient size and/or use of iterative reconstruction technique. COMPARISON:  08/12/2022 FINDINGS: Lower chest: Emphysema. Development of areas of nodular airspace disease within both lower lobes since 08/12/2022. Mild cardiomegaly. Distal right coronary artery calcification. Hypoattenuation in the intravascular space suggests anemia. Trace left pleural fluid is similar. Hepatobiliary: Normal liver. Normal gallbladder, without biliary ductal dilatation. Pancreas: Soft tissue density within the pancreatic tail of 2.1 cm in 23/3 is most consistent with a splenule, when correlated with contrast enhanced exam. No duct dilatation or acute inflammation. Spleen: Otherwise normal spleen. Adrenals/Urinary Tract: Normal adrenal glands. Marked renal cortical thinning bilaterally. Renal vascular calcifications. No hydronephrosis. The bladder is decompressed, without calcified stone. Stomach/Bowel: Normal stomach, without wall thickening. Scattered colonic diverticula. Normal terminal ileum and appendix. Normal small bowel. Vascular/Lymphatic: Advanced aortic and branch vessel atherosclerosis. Bilateral iliac stent grafts. No abdominopelvic adenopathy. Reproductive: Normal prostate. Other: No free intraperitoneal air. No intraperitoneal fluid identified. Hemorrhage within the anterior extraperitoneal pelvic prevesical space at maximally 10.3 x 6.3 cm on 35/3. This is contiguous with right pelvic extraperitoneal hematoma at 4.7 x 3.3 cm on 71/3. Increase in left pelvic complex fluid which is also presumably hemorrhage on 68/3. There is adjacent gas as well as gas and fluid in the  left groin, new on 75/3-likely iatrogenic. Musculoskeletal: Posterolateral right ninth rib nonacute but incompletely healed fracture. Transitional S1 vertebral body. IMPRESSION: 1. Anterior pelvic extraperitoneal hematoma within the prevesical space. Right-greater-than-left extension of hemorrhage within the extraperitoneal lateral pelvic walls. 2. Trace left pleural fluid. 3. Development of nodular airspace disease since 08/12/2022, favoring infection or aspiration. 4. Coronary artery atherosclerosis. Aortic Atherosclerosis (ICD10-I70.0). Emphysema (ICD10-J43.9). These results will be called to the ordering clinician or representative by the Radiologist Assistant, and communication documented in the PACS or [employer]. Electronically Signed   By: Abigail Miyamoto M.D.   On: 09/05/2022 13:25   MR CERVICAL SPINE WO CONTRAST  Result Date: 09/05/2022 CLINICAL DATA:  Myelopathy, acute, cervical spine. EXAM: MRI CERVICAL SPINE WITHOUT CONTRAST TECHNIQUE: Multiplanar, multisequence MR imaging of the cervical spine was performed. No intravenous contrast was administered. COMPARISON:  Radiographs 05/11/2022, CT 08/07/2021 and MRI of the cervical  spine 06/25/2022. FINDINGS: Despite efforts by the technologist and patient, mild to moderate motion artifact is present on today's exam and could not be eliminated. This reduces exam sensitivity and specificity. Alignment: Straightening without focal angulation or listhesis. Vertebrae: Interval improvement in the previously demonstrated abnormalities of the C5 and C6 vertebral bodies and intervening C5-6 disc. There is less marrow edema and less surrounding inflammatory change. No new disc space findings or acute osseous abnormalities. Cord: Stable cord flattening and possible T2 hyperintensity in the cord at the C3-4 and C5-6 levels. No evidence of cord hemorrhage. Posterior Fossa, vertebral arteries, paraspinal tissues: Intracranial findings are dictated separatelybilateral  vertebral artery flow voids. The prevertebral inflammatory changes seen previously have significantly improved in the interval. No paraspinal fluid collection identified. Oval T2 hyperintensity within the left mandible was not previously imaged and could reflect osteomyelitis. Disc levels: C2-3: No significant findings. C3-4: Stable spondylosis with loss of disc height and posterior osteophytes covering diffusely bulging disc material. There is uncinate spurring and facet hypertrophy bilaterally contributing to stable mild cord flattening and foraminal narrowing bilaterally. There is possible mild cord T2 hyperintensity, grossly stable. C4-5: Mild spondylosis with mild disc bulging and facet hypertrophy. No cord deformity or high-grade foraminal narrowing. C5-6: Chronic spondylosis with loss of disc height and posterior osteophytes covering diffusely bulging disc material. The AP diameter of the canal is narrowed to approximately 5 mm with unchanged chronic cord compression. Moderate foraminal narrowing bilaterally. As above, the previously demonstrated paraspinal inflammatory changes and marrow edema at this level have improved. C6-7: Stable spondylosis with posterior osteophytes covering diffusely bulging disc material. Mild spinal stenosis and mild foraminal narrowing bilaterally. C7-T1: Stable mild disc bulging and mild uncinate spurring bilaterally. No cord deformity. Mild foraminal narrowing bilaterally. T1-2: Normal interspace. IMPRESSION: 1. Compared with previous MRI from 06/25/2022, there has been significant improvement in the previously demonstrated paraspinal inflammatory changes and marrow edema at the C5-6 level, suggesting improving discitis/osteomyelitis. Correlate clinically. No residual paraspinal fluid collection identified. 2. Similar multilevel cervical spondylosis, most advanced at C5-6 where there is chronic cord compression and moderate foraminal narrowing bilaterally. Possible mild cord T2  hyperintensity at C3-4 and C5-6. 3. T2 hyperintensity in the left body of the mandible, not previously imaged and potentially reflecting osteomyelitis. Correlate clinically. 4. No acute findings identified. Electronically Signed   By: Richardean Sale M.D.   On: 09/05/2022 12:37   MR ANGIO HEAD WO CONTRAST  Result Date: 09/05/2022 CLINICAL DATA:  Acute cervical myelopathy EXAM: MRA HEAD WITHOUT CONTRAST TECHNIQUE: Angiographic images of the Circle of Willis were acquired using MRA technique without intravenous contrast. COMPARISON:  Brain MRI from yesterday FINDINGS: Pervasive motion artifact. The left vertebral artery is strongly dominant with thready flow in the right vertebral artery accentuated by motion. No branch occlusion, beading, or aneurysm. No flow limiting stenosis. IMPRESSION: Motion degraded MRA without acute finding. Irregular appearance of the right vertebral artery partially related to motion. Electronically Signed   By: Jorje Guild M.D.   On: 09/05/2022 11:54   MR LUMBAR SPINE WO CONTRAST  Result Date: 09/04/2022 CLINICAL DATA:  LLE EXAM: MRI LUMBAR SPINE WITHOUT CONTRAST TECHNIQUE: Multiplanar, multisequence MR imaging of the lumbar spine was performed. No intravenous contrast was administered. COMPARISON:  None Available. FINDINGS: Limited due to motion/artifact. Segmentation: Standard segmentation is assumed. The inferior-most fully formed intervertebral disc is labeled L5-S1. Alignment:  No substantial sagittal subluxation. Vertebrae: Heterogeneous bone marrow. No focal edema to suggest acute fracture or discitis/osteomyelitis. No focal suspicious  bone lesion. Conus medullaris and cauda equina: Conus extends to the superior L2 level. Conus appears normal. Paraspinal and other soft tissues: Limited visualization without obvious acute abnormality. Disc levels: T12-L1: No significant disc protrusion, foraminal stenosis, or canal stenosis. L1-L2: No significant disc protrusion, foraminal  stenosis, or canal stenosis. L2-L3: No significant disc protrusion, foraminal stenosis, or canal stenosis. L3-L4: Mild disc bulging and facet arthropathy. No significant stenosis. L4-L5: No significant disc protrusion, foraminal stenosis, or canal stenosis. L5-S1: Facet arthropathy. Small left paracentral disc protrusion which comes in close proximity to the exiting left L5 nerve without impingement. No significant stenosis. IMPRESSION: 1. Motion limited study without evidence of significant canal or foraminal stenosis. 2. At L5-S1, small left paracentral disc protrusion which comes in close proximity to the exiting left L5 nerve without impingement. Electronically Signed   By: Margaretha Sheffield M.D.   On: 09/04/2022 12:19   MR BRAIN WO CONTRAST  Result Date: 09/04/2022 CLINICAL DATA:  Provided history: Altered mental status, left lower extremity weakness. EXAM: MRI HEAD WITHOUT CONTRAST TECHNIQUE: Multiplanar, multiecho pulse sequences of the brain and surrounding structures were obtained without intravenous contrast. COMPARISON:  Head CT 08/30/2022. FINDINGS: Intermittently motion degraded examination, limiting evaluation. Most notably, the sagittal T1 sequence is moderately motion degraded and the least motion degraded axial T2 sequence is moderately motion degraded. Within this limitation, findings are as follows. Brain: Moderate generalized cerebral atrophy. Mild cerebellar atrophy. 4 mm acute infarct within the right subinsular white matter (series 2, images 22 and 23) (series 3, image 19). Redemonstrated chronic lacunar infarct within the right basal ganglia. Background multifocal T2 FLAIR hyperintense signal abnormality within the cerebral white matter, nonspecific but compatible with mild chronic small vessel ischemic disease. These findings are similar to the prior brain MRI of 05/09/2021. As before, there are a few chronic microhemorrhages scattered within the supratentorial brain. Redemonstrated tiny  chronic lacunar infarct within the central pons, and small chronic infarcts within the left cerebellar hemisphere. No evidence of an intracranial mass. No extra-axial fluid collection. No midline shift. Vascular: As before, there is signal abnormality within the intracranial right vertebral artery suggesting high-grade stenosis or vessel occlusion. Skull and upper cervical spine: No focal suspicious marrow lesion. Incompletely assessed cervical spondylosis. Sinuses/Orbits: No mass or acute finding within the imaged orbits. No more than trace paranasal sinus mucosal thickening. IMpression #2 will be called to the ordering clinician or representative by the Radiologist Assistant, and communication documented in the PACS or [employer]. IMPRESSION: 1. Motion degraded examination. 2. 4 mm acute infarct within the right subinsular white matter. 3. Parenchymal atrophy, chronic small vessel ischemic disease and chronic infarcts as described. 4. Few chronic microhemorrhages within the supratentorial brain. 5. Chronic signal abnormality within the intracranial right vertebral artery suggesting high-grade stenosis or vessel occlusion. Electronically Signed   By: Kellie Simmering D.O.   On: 09/04/2022 12:01   VAS [place] LOWER EXTREMITY SAPHENOUS VEIN MAPPING  Result Date: 08/31/2022 LOWER EXTREMITY VEIN MAPPING Patient Name:  Albert Harris  Date of Exam:   08/31/2022 Medical Rec #: [id number]       Accession #:    [id number] Date of Birth: [birth year]      Patient Gender: M Patient Age:   45 years Exam Location:  [facility] Procedure:      VAS [place] LOWER EXTREMITY SAPHENOUS VEIN MAPPING Referring Phys: Vonna Kotyk ROBINS --------------------------------------------------------------------------------  Indications:  Pre-op Risk Factors: PAD.  Limitations: Right Fem-pop bypass graft with saphenous vein 09/05/21 Comparison  Study: Prior vein mapping done 09/18/20 Performing Technologist: Sharion Dove RVS  Examination Guidelines:  A complete evaluation includes B-mode imaging, spectral Doppler, color Doppler, and power Doppler as needed of all accessible portions of each vessel. Bilateral testing is considered an integral part of a complete examination. Limited examinations for reoccurring indications may be performed as noted. +----------+--------------+-----------------------+----------------+-----------+     RT     RT Findings            GSV          LT Diameter (cm)LT Findings  Diameter                                                                     (cm)                                                                    +----------+--------------+-----------------------+----------------+-----------+             Harvested   Saphenofemoral Junction      0.67                  +----------+--------------+-----------------------+----------------+-----------+             Harvested       Proximal thigh           0.48                  +----------+--------------+-----------------------+----------------+-----------+             Harvested          Mid thigh             0.40                  +----------+--------------+-----------------------+----------------+-----------+             Harvested        Distal thigh         0.44/0.31     branching  +----------+--------------+-----------------------+----------------+-----------+           not visualized         Knee                0.22                  +----------+--------------+-----------------------+----------------+-----------+           not visualized       Prox calf             0.21                  +----------+--------------+-----------------------+----------------+-----------+           not visualized       Mid calf            0.24/1.8     branching  +----------+--------------+-----------------------+----------------+-----------+           not visualized      Distal calf         0.360/.09     branching   +----------+--------------+-----------------------+----------------+-----------+  not visualized         Ankle            0.15/0.12     branching  +----------+--------------+-----------------------+----------------+-----------+ Diagnosing physician: [name] MD Electronically signed by [name] MD on 08/31/2022 at 6:30:54 PM.    Final    VAS [place] (AVF, AVG)  Result Date: 08/31/2022 DIALYSIS ACCESS Patient Name:  Albert Harris  Date of Exam:   08/31/2022 Medical Rec #: [id number]       Accession #:    [id number] Date of Birth: Mar 08, 1956      Patient Gender: M Patient Age:   36 years Exam Location:  [facility] Procedure:      VAS US DUPLEX DIALYSIS ACCESS (AVF, AVG) Referring Phys: Santiago Bumpers --------------------------------------------------------------------------------  Reason for Exam: No palpable thrill for AVF/AVG. Access Site: Right Upper Extremity. Access Type: Forearm loop AVG. Comparison Study: No prior study Performing Technologist: Sharion Dove RVS  Examination Guidelines: A complete evaluation includes B-mode imaging, spectral Doppler, color Doppler, and power Doppler as needed of all accessible portions of each vessel. Unilateral testing is considered an integral part of a complete examination. Limited examinations for reoccurring indications may be performed as noted.  Findings:   Thrombosed AV graft  Summary: Thrombosed right AV graft  *See table(s) above for measurements and observations.  Diagnosing physician: [name] MD Electronically signed by [name] MD on 08/31/2022 at 6:30:35 PM.    --------------------------------------------------------------------------------   Final    ECHOCARDIOGRAM LIMITED  Result Date: 08/31/2022    ECHOCARDIOGRAM LIMITED REPORT   Patient Name:   Albert Harris Date of Exam: 08/31/2022 Medical Rec #:  [id number]      Height:       68.0 in Accession #:    [id number]      Weight:       144.4 lb Date of Birth:  1955-12-13     BSA:          1.780 m Patient Age:    61 years       BP:           165/52 mmHg Patient Gender: M              HR:           79 bpm. Exam Location:  Inpatient Procedure: Limited Echo, Cardiac Doppler and Limited Color Doppler Indications:    Bacteremia R78.81  History:        Patient has prior history of Echocardiogram examinations, most                 recent 06/29/2022. 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  Sonographer:    Greer Pickerel Referring Phys: Toney Sang SOOD  Sonographer Comments: Image acquisition challenging due to respiratory motion. IMPRESSIONS  1. Left ventricular ejection fraction, by estimation, is 45%. There is moderate to severe concentric left ventricular hypertrophy. Diastolic function was not assessed.  2. Right ventricular systolic function is normal.  3. The mitral valve is grossly normal with mild thickening of the mitral valve leaflets. Mild mitral valve regurgitation.  4. Right atrial size was mildly dilated.  5. Left atrial size was mildly dilated.  6.  The inferior vena cava is normal in size with greater than 50% respiratory variability, suggesting right atrial pressure of 3 mmHg.  7. The aortic valve was not well visualized. There is mild calcification of the aortic valve. There is mild thickening of the aortic valve. Aortic valve regurgitation is not visualized. FINDINGS  Left Ventricle: Left ventricular ejection fraction, by estimation, is 45 to 50%. The left ventricle has mildly decreased function. There is severe concentric left ventricular hypertrophy. Left ventricular diastolic function could not be evaluated. Right Ventricle: No increase in right ventricular wall thickness. Right ventricular systolic function is normal. Left Atrium: Left atrial size was mildly dilated. Right  Atrium: Right atrial size was mildly dilated. Pericardium: There is no evidence of pericardial effusion. Mitral Valve: The mitral valve is grossly normal. There is mild thickening of the mitral valve leaflet(s). Mild mitral annular calcification. Mild mitral valve regurgitation. Tricuspid Valve: The tricuspid valve is normal in structure. Tricuspid valve regurgitation is mild. Aortic Valve: The aortic valve was not well visualized. There is mild calcification of the aortic valve. There is mild thickening of the aortic valve. Aortic valve regurgitation is not visualized. Pulmonic Valve: The pulmonic valve was not assessed. Venous: The inferior vena cava is normal in size with greater than 50% respiratory variability, suggesting right atrial pressure of 3 mmHg. Additional Comments: Spectral Doppler performed. Color Doppler performed.  LEFT VENTRICLE PLAX 2D LVIDd:         4.00 cm LVIDs:         3.00 cm LV PW:         1.60 cm LV IVS:        1.10 cm  LEFT ATRIUM         Index LA diam:    2.80 cm 1.57 cm/m  MITRAL VALVE MV Area (PHT): 2.73 cm MV Decel Time: 278 msec MV E velocity: 72.80 cm/s MV A velocity: 99.00 cm/s MV E/A ratio:  0.74 Aditya Sabharwal Electronically signed by Hebert Soho Signature Date/Time: 08/31/2022/2:22:32 PM    Final    CT HEAD WO CONTRAST (5MM)  Result Date: 08/30/2022 CLINICAL DATA:  Initial evaluation for mental status change. EXAM: CT HEAD WITHOUT CONTRAST TECHNIQUE: Contiguous axial images were obtained from the base of the skull through the vertex without intravenous contrast. RADIATION DOSE REDUCTION: This exam was performed according to the departmental dose-optimization program which includes automated exposure control, adjustment of the mA and/or kV according to patient size and/or use of iterative reconstruction technique. COMPARISON:  Prior CT from 08/07/2021. FINDINGS: Brain: Age-related cerebral atrophy with mild chronic small vessel ischemic disease. Chronic right basal  ganglia lacunar infarct. No acute intracranial hemorrhage. No acute large vessel territory infarct. No mass lesion or midline shift. No hydrocephalus or extra-axial fluid collection. Vascular: No abnormal hyperdense vessel. Scattered calcified atherosclerosis about the skull base, stable. Skull: Scalp soft tissues and calvarium demonstrate no acute finding. Sinuses/Orbits: Globes and orbital soft tissues within normal limits. Paranasal sinuses are clear. Trace right mastoid effusion noted, of doubtful significance. Other: None. IMPRESSION: 1. No acute intracranial abnormality. 2. Age-related cerebral atrophy with mild chronic small vessel ischemic disease, with small remote right basal ganglia lacunar infarct, stable. Electronically Signed   By: Jeannine Boga M.D.   On: 08/30/2022 19:21   DG Abd Portable 1V  Result Date: 08/29/2022 CLINICAL DATA:  Orogastric tube placement EXAM: PORTABLE ABDOMEN - 1 VIEW COMPARISON:  Portable exam 1228 hours compared to 1040 hours FINDINGS: Tip of nasogastric tube projects over  distal gastric antrum/pylorus. Lung bases clear. Nonobstructive bowel gas pattern. IMPRESSION: Tip of nasogastric tube projects over distal gastric antrum/pylorus. Electronically Signed   By: Lavonia Dana M.D.   On: 08/29/2022 12:45   DG Abd Portable 1V  Result Date: 08/29/2022 CLINICAL DATA:  Orogastric tube placement EXAM: PORTABLE ABDOMEN - 1 VIEW COMPARISON:  08/12/2022 CT abdomen FINDINGS: The orogastric tube distal tip is in the stomach body with side port in the gastric fundus. Borderline enlargement of the cardiopericardial silhouette. Formed stool in the visualized colon. Mild lumbar spondylosis. Enlargement of the cardiopericardial silhouette IMPRESSION: 1. Orogastric tube tip is in the stomach body with side port in the gastric fundus. 2. Borderline enlargement of the cardiopericardial silhouette. Electronically Signed   By: Van Clines M.D.   On: 08/29/2022 10:52   DG Chest  Portable 1 View  Result Date: 08/28/2022 CLINICAL DATA:  Central line placement EXAM: PORTABLE CHEST 1 VIEW COMPARISON:  08/12/2022 FINDINGS: Right IJ line with tip at the upper SVC. Artifact from EKG leads. Nipple shadows asymmetric to the left, confirmed on recent abdominal CT covering the lung bases. There is no edema, consolidation, effusion, or pneumothorax. Normal heart size. IMPRESSION: Right IJ line without complicating feature. Electronically Signed   By: Jorje Guild M.D.   On: 08/28/2022 21:41   CT Angio Aortobifemoral W and/or Wo Contrast  Result Date: 08/28/2022 CLINICAL DATA:  Claudication or leg ischemia EXAM: CT ANGIOGRAPHY OF ABDOMINAL AORTA WITH ILIOFEMORAL RUNOFF TECHNIQUE: Multidetector CT imaging of the abdomen, pelvis and lower extremities was performed using the standard protocol during bolus administration of intravenous contrast. Multiplanar CT image reconstructions and MIPs were obtained to evaluate the vascular anatomy. RADIATION DOSE REDUCTION: This exam was performed according to the departmental dose-optimization program which includes automated exposure control, adjustment of the mA and/or kV according to patient size and/or use of iterative reconstruction technique. CONTRAST:  95mL OMNIPAQUE IOHEXOL 350 MG/ML SOLN COMPARISON:  CT 08/12/2022 and previous FINDINGS: VASCULAR Aorta: Extensive partially calcified atheromatous plaque throughout. No aneurysm, dissection, or stenosis. 4 mm penetrating atheromatous ulcer along the left posterolateral wall in the infrarenal segment. Celiac: Calcified ostial plaque resulting in short segment stenosis of moderate severity, atheromatous but patent distally. SMA: Significant partially calcified atheromatous plaque extending from the ostium approximally 2.4 cm, with progression to at least moderately severe stenosis since 01/01/2022, with continued patency distally with classic distal branch anatomy. Renals: Single left, with near occlusive  ostial plaque extending over length of at least 1.2 cm, patent but atheromatous distally. Single right, with heavily calcified atheromatous plaque through its length , of likely hemodynamic significance. IMA: Atheromatous but patent. RIGHT Lower Extremity Inflow: Common iliac proximal stent patent, distal stent with mild residual/recurrent narrowing at its proximal end, patent across the bifurcation. Internal iliac occluded at its origin External iliac overlapping stents through nearly its length, with moderate residual/recurrent stenosis at the distal margin at the common femoral artery. Outflow: Common femoral is mildly atheromatous but patent distally. Deep femoral branches patent proximally, segmentally occluded distally. SFA proximal occlusion extending throughz its length. Patent fem-pop graft, with some retrograde filling of the proximal native popliteal artery. Runoff: Venous opacification in the proximal calf limited evaluation of proximal tibial runoff. Scattered atheromatous calcifications and poor enhancement in the distal calf limiting assessment of distal trifurcation runoff. LEFT Lower Extremity Inflow: Common iliac patent stents through its length Internal iliac origin occlusion External iliac patent stent through its length. Outflow: Common femoral has 1.1 cm thin-necked pseudoaneurysm from  its proximal segment, patent distally. Deep femoral branches patent SFA origin occlusion obtaining throughout its length despite presence of stents. Popliteal is reconstituted proximally by collaterals, patent but moderately atheromatous through its distal length. Runoff: Atheromatous but apparently contiguous three-vessel tibial runoff to above the ankle, with extensive vascular calcifications and limited enhancement limiting assessment across the ankle and into the foot. Veins: No obvious venous abnormality within the limitations of this arterial phase study. Review of the MIP images confirms the above  findings. NON-VASCULAR Lower chest: No pleural or pericardial effusion. Visualized lung bases clear. Hepatobiliary: No focal liver abnormality is seen. No gallstones, gallbladder wall thickening, or biliary dilatation. Pancreas: Unremarkable. No pancreatic ductal dilatation or surrounding inflammatory changes. Spleen: Normal in size without focal abnormality. 2 cm probable splenule abutting the pancreatic tail, stable since earliest available scan 06/18/2020. Adrenals/Urinary Tract: No adrenal mass. Marked bilateral renal parenchymal atrophy with extensive renal arterial branch calcifications. No hydronephrosis or mass. Urinary bladder is nondistended. Stomach/Bowel: Stomach is partially distended by gas and fluid, no acute findings. Small bowel is decompressed. Normal appendix. The colon is incompletely distended, with scattered diverticula at the descending/sigmoid junction, no adjacent inflammatory changes. Lymphatic: No abdominal or pelvic adenopathy. Reproductive: Mild prostate enlargement. Other: Subcutaneous gas bubbles overlying left common femoral vessels. There is a deep subcutaneous hematoma overlying the left common femoral vessels with extension into the anterior extraperitoneal space of the pelvis, dominant component measuring 11.6 x 6.3 cm,, and an 8.3 cm right-sided component extending along the pelvic sidewall. No active extravasation is identified. No ascites or hemoperitoneum. No free air. Left pelvic phleboliths. Musculoskeletal: No acute findings. IMPRESSION: 1. 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 2. New anterior pelvic extraperitoneal hematoma measuring up to 11.6 cm diameter. No active extravasation evident. 3. Long segment LEFT SFA occlusion. Critical Value/emergent results were discussed by telephone at the time of interpretation on 08/28/2022 at 7:35 pm with provider [place] , who verbally acknowledged these results. 4. Residual/recurrent stenosis at  the distal margin of the RIGHT external iliac stent. 5. Patent RIGHT fem-pop graft 6. Patent LEFT iliac stents. 7. 4 mm penetrating atheromatous ulcer in the infrarenal abdominal aorta. 8. Colonic diverticulosis. 9. Aortic Atherosclerosis (ICD10-I70.0). Electronically Signed   By: Lucrezia Europe M.D.   On: 08/28/2022 19:40   HYBRID OR IMAGING ([facility] ONLY)  Result Date: 08/22/2022 There is no interpretation for this exam.  This order is for images obtained during a surgical procedure.  Please See "Surgeries" Tab for more information regarding the procedure.   CT ABDOMEN PELVIS W CONTRAST  Result Date: 08/12/2022 CLINICAL DATA:  Abdominal pain, nausea, vomiting, diarrhea, recent diagnosis of C. difficile EXAM: CT ABDOMEN AND PELVIS WITH CONTRAST TECHNIQUE: Multidetector CT imaging of the abdomen and pelvis was performed using the standard protocol following bolus administration of intravenous contrast. RADIATION DOSE REDUCTION: This exam was performed according to the departmental dose-optimization program which includes automated exposure control, adjustment of the mA and/or kV according to patient size and/or use of iterative reconstruction technique. CONTRAST:  67mL OMNIPAQUE IOHEXOL 350 MG/ML SOLN COMPARISON:  01/01/2022 FINDINGS: Lower chest: Cardiomegaly.  Trace bilateral pleural effusions. Hepatobiliary: No focal liver abnormality is seen. Hyperattenuating fluid within the gallbladder suggesting vicarious excretion of contrast. No pericholecystic inflammatory changes by CT. No biliary dilatation. Pancreas: Splenule is again noted abutting the distal pancreatic tail. Pancreas appears otherwise unremarkable. No pancreatic ductal dilatation or surrounding inflammatory changes. Spleen: Normal in size without focal abnormality. Adrenals/Urinary Tract: Adrenal  glands are unremarkable. Kidneys are normal, without renal calculi, focal lesion, or hydronephrosis. Bladder is decompressed. Stomach/Bowel: Stomach within  normal limits. No dilated loops of bowel. Long segment mild circumferential wall thickening of the sigmoid colon, and to a lesser degree involving the descending colon. There are numerous left-sided colonic diverticula, although no focally inflamed diverticulum is seen. A normal appendix is seen in the right lower quadrant. Vascular/Lymphatic: Severe aortoiliac atherosclerosis without aneurysm. No abdominopelvic lymphadenopathy. Reproductive: Prostate is unremarkable. Other: Trace perihepatic ascites. Small volume retroperitoneal fluid within the anterolateral aspect of the left hemipelvis measuring greater than fluid density. Approximate measurements of 6.5 x 1.8 x 5.1 cm (volume = 31 cm^3). No pneumoperitoneum. Musculoskeletal: No acute or significant osseous findings. IMPRESSION: 1. Small volume retroperitoneal fluid within the left hemipelvis measuring greater than fluid density, suggestive of a retroperitoneal hematoma of indeterminate etiology. 2. Long segment mild circumferential wall thickening of the sigmoid colon, and to a lesser degree involving the descending colon. Findings are suggestive of an infectious or inflammatory colitis. 3. Trace perihepatic ascites. 4. Trace bilateral pleural effusions. 5. Severe aortoiliac atherosclerosis (ICD10-I70.0). Electronically Signed   By: Davina Poke D.O.   On: 08/12/2022 16:41   DG Chest Port 1 View  Result Date: 08/12/2022 CLINICAL DATA:  Shortness of breath EXAM: PORTABLE CHEST 1 VIEW COMPARISON:  CXR 07/21/22 FINDINGS: No pleural effusion. No pneumothorax. Unchanged cardiomegaly. Unchanged mediastinal contours. Focal airspace opacity. Redemonstrated are prominent bilateral interstitial opacities, which could represent pulmonary venous congestion or pulmonary edema. No radiographically apparent displaced rib fractures. Visualized upper abdomen is unremarkable. IMPRESSION: 1. Unchanged cardiomegaly with prominent bilateral interstitial opacities, which could  represent pulmonary venous congestion or pulmonary edema. 2. No focal airspace opacity. Electronically Signed   By: Marin Roberts M.D.   On: 08/12/2022 14:41   PERIPHERAL VASCULAR CATHETERIZATION  Result Date: 08/10/2022 Table formatting from the original result was not included. Images from the original result were not included. PATIENT: Albert Harris      MRN: [id number] DOB: [birth year]    DATE OF PROCEDURE: 08/10/2022 INDICATIONS:  KARAS MACHNIK is a 67 y.o. male who presented with rest pain of his left foot.  He has undergone a previous right iliofemoral endarterectomy and a right femoropopliteal bypass.  He is also had bilateral iliac stents. PROCEDURE:  Conscious sedation Ultrasound-guided access to the left common femoral artery Aortogram with left lower extremity runoff Angioplasty and stenting of the left external iliac artery (7 mm x 5 cm Viabahn stent x 2; postdilatation with a 6 mm balloon) SURGEON: Judeth Cornfield. Scot Dock, MD, FACS ANESTHESIA: Local with sedation EBL: Minimal TECHNIQUE: The patient was brought to the peripheral vascular lab and was sedated. The period of conscious sedation was 105 minutes.  During that time period, I was present face-to-face 100% of the time.  The patient was administered initially 1 mg of Versed and 50 mcg of fentanyl. [name] heart rate, blood pressure, and oxygen saturation were monitored by the nurse continuously during the procedure. Both groins were prepped and draped in the usual sterile fashion.  Under ultrasound guidance, after the skin was anesthetized, I cannulated the left common femoral artery with a micropuncture needle and a micropuncture sheath was introduced over a wire.  By ultrasound the femoral artery was patent.  There was significant calcific disease in the common femoral artery.  A real-time image was obtained and sent to the server.This was exchanged for a 5 [place] sheath over a Bentson wire.  It  was difficult to pass the wire and  ultimately the wire and sheath came out.  I held pressure for 10 minutes.  There was no evidence of hematoma.  I then under ultrasound guidance again accessed the left common femoral artery with a micropuncture needle.  This time I used the Nitrex wire and was able to navigate into the common iliac artery and advanced the micropuncture sheath over the wire.  I then used an angled Glidewire to get up into the infrarenal aorta.  I exchanged this for a Rosen wire over a straight catheter.  I then placed a 5 French sheath in the left groin.  The pigtail catheter was positioned at the L1 vertebral body and flush aortogram obtained.  The cath was positioned above the aortic bifurcation and an oblique iliac projection was obtained.  The patient had significant disease in the external iliac artery on the left and I elected to address this with angioplasty and stenting.  I attempted to advance a 7 [place] destination sheath over the [facility] wire but met significant resistance.  Therefore the patient was heparinized.  ACT was monitored throughout the procedure.  I dilated the area of concern in the external iliac artery with a 5 mm x 4 cm balloon.  At this point I was able to advance the 7 French sheath.  I then exchanged wires for a 0.018 wire.  I selected a 7 x 50 Viabahn stent.  This was positioned below the takeoff of the hypogastric artery which was occluded.  This was deployed without difficulty and postdilatation was done with a 6 mm balloon.  There was an area of concern the distal stent and I elected to cover this.  A second 7 mm x 50 mm Viabahn stent was overlapped to cover the area of concern and this was deployed without difficulty.  Postdilatation was done with a 6 mm balloon.  There was an excellent result with no residual stenosis. Runoff film was then obtained on the left.  At the at the completion we exchanged the long 7 French sheath for a short 7 [place] sheath which was easily advanced into the iliac system on  the left at this point. FINDINGS: The infrarenal aorta is patent with no significant stenosis identified.  The renal arteries are patent with no significant renal artery stenosis identified. On the right side the common iliac and external iliac arteries are patent. On the left side the common iliac artery is patent and it has previously been stented.  There was diffuse disease in the external iliac artery with up to an 80% stenosis.  This was successfully addressed with angioplasty and stenting as described above with no residual stenosis. Low that the common femoral had calcific disease but was patent.  The deep femoral artery is patent.  The superficial femoral artery is occluded at its origin with reconstitution of the above-knee popliteal artery.  The patient has diffuse tibial disease.  The dominant runoff is the peroneal artery which occludes above the foot.  The anterior tibial artery has diffuse disease proximally and then occludes in the mid leg.  There is reconstitution of the distal anterior tibial and dorsalis pedis artery on the left.  The posterior tibial artery is occluded and reconstitutes above the ankle with moderate disease. CLINICAL NOTE: Hopefully by addressing the inflow this will help with his rest pain.  Otherwise we would have to consider infrainguinal bypass.  He has diffuse calcific disease in the common femoral artery and the  stent extends very low.  I would like to avoid bypass if possible.  However when he returns for a follow-up visit we will obtain vein mapping.  He has not been taking aspirin or a statin.  He was started on 81 mg of aspirin, 75 mg of Plavix daily, and 10 mg of Crestor daily.  This was sent to his pharmacy. WIfI 0 1 0 Clinical stage I [name], MD, FACS [facility] [place]     PHYSICAL EXAM  Temp:  [97.8 F (36.6 C)-99.2 F (37.3 C)] 97.8 F (36.6 C) (04/02 0740) Pulse Rate:  [73-99] 81 (04/02 0900) Resp:  [16-28] 28 (04/02  0800) BP: (74-156)/(42-106) 120/66 (04/02 0900) SpO2:  [94 %-100 %] 98 % (04/02 0900) Weight:  [43.9 kg] 43.9 kg (04/01 2245)  General - Well nourished, well developed, in no apparent distress.  Ophthalmologic - fundi not visualized due to noncooperation.  Cardiovascular - Regular rhythm and rate.  Mental Status -  Level of arousal and orientation to time, place, and person were intact. Language including expression, naming, repetition, comprehension was assessed and found intact. Fund of Knowledge was assessed and was intact.  Cranial Nerves II - XII - II - Visual field intact OU. III, IV, VI - Extraocular movements intact. V - Facial sensation intact bilaterally. VII - Facial movement intact bilaterally. VIII - Hearing & vestibular intact bilaterally. X - Palate elevates symmetrically. XI - Chin turning & shoulder shrug intact bilaterally. XII - Tongue protrusion intact.  Motor Strength - [name] strength was normal in b/l upper extremities and pronator drift was absent.  LLE 2+/5 proximal and 1/5 distal, and RLE 3+/5 proximal and 4/5 distally. Bulk was normal and fasciculations were absent.   Motor Tone - Muscle tone was assessed at the neck and appendages and was normal.  Reflexes - [name] reflexes were 1+ decreased in all extremities and he had no pathological reflexes.  Sensory - Light touch, temperature/pinprick were assessed and were symmetrical. However, intense pain with passive movement of the left leg, significant at left popliteal fossa and calf. Much improved tenderness at right leg/foot.    Coordination - The patient had normal movements in the hands with no ataxia or dysmetria.  Tremor was absent.  Gait and Station - deferred.   ASSESSMENT/PLAN Albert Harris is a 67 y.o. male with history of ESRD on HD, PAD, HTN, HLD admitted for abdominal pain. Found to have left femoral psudoaneurysm s/p repair. Also found RUE AVG occlusion s/p failed attempt  of thrombectomy. Found confusion and left leg weakness, MRI showed incidental lacunar infarct.    B/l leg weakness and pain, L>R  CTA 3/22 - 1.1 cm LEFT common femoral artery pseudoaneurysm. Long segment LEFT SFA occlusion. Residual/recurrent stenosis at the distal margin of the RIGHT external iliac stent. Patent RIGHT fem-pop graft. Patent LEFT iliac stents. S/p left femoral artery pseudoaneurysm repair 3/22 Reported b/l leg weakness, more significant at left Intense pain with passive movement of left LE at calf and popliteal possa MRI L-spine unremarkable MRI C-spine chronic C5-6 chord compression and signal change MRI T-spine pending Concerning for bilateral leg vasculopathy, L>R LE arterial doppler Left chronically occluded SFA. Monophasic and dampened monophasic flow noted in the popliteal, posterior tibial, anterior tibial, and peroneal arteries.  Right patent fem-pop bypass graft. 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 Ongoing aggressive stroke risk factor management Therapy recommendations:  pending Disposition:  pending  Retroperitoneal hemorrhage 3/22 CTA anterior pelvic extraperitoneal hematoma measuring up to 11.6 cm diameter. No active extravasation evident. 3/30 CT abd Anterior pelvic extraperitoneal hematoma within the prevesical space. Right-greater-than-left extension of hemorrhage within the extraperitoneal lateral pelvic walls. VVS on board CTA abd/pelvis pending  PVD Hx of common and left external  iliac artery stenting, and right fem-pop bypass CTA 3/22 - 1.1 cm LEFT common femoral artery pseudoaneurysm. Long segment LEFT SFA occlusion. Residual/recurrent stenosis at the distal margin of the RIGHT external iliac stent. Patent RIGHT fem-pop graft. Patent LEFT iliac stents. S/p left femoral artery pseudoaneurysm repair 3/22 S/p AVG thrombectomy attempt but patient became hemodynamically unstable and procedure was aborted.  VVS on board Plan for dialysis catheter on Monday  Bacteremia  Left groin wound Hx of possible osteomyelitis  C-spine MRI showed improved edema paraspinal tissue concerning for evidence of prior osteomyelitis at cervical spine.  B Cx + for MRSA likely due to LLE post op infection -> wound worsening -> washout in am B Cx neg 3/27 Leukocytosis WBC 22.3->19.4->19.9->19.8->29.0 ID on board On vancomycin TEE no endocarditis  Acute anemia Back pain Pt complaining of lower back pain 3/30 Hb 7.5->7.8->9.2->8.1->9.1->7.5->7.0->PRBC->9.2->10.0 S/p left femoral artery pseudoaneurysm 3/22 3/30 CT abd Anterior pelvic extraperitoneal hematoma within the prevesical space. Right-greater-than-left extension of hemorrhage within the extraperitoneal lateral pelvic walls. CBC monitoring  Hx of hypertension Transient hypotension 3/30 Stable now S/p IVF bolus and PRBC 3/30 - responded well Long term BP goal normotensive  Hyperlipidemia Home meds:  crestor 10  LDL 45, goal < 70 Resume crestor 10 No high intensity statin given LDL at goal Continue statin on discharge  Other Stroke Risk Factors Advanced age Former Cigarette smoker ESRD on HD    Other Active Problems Hyponatremia - Na 131->133->134  Hospital day # 40  I had long discussion with pt and daughter at bedside, updated pt current condition, treatment plan and potential prognosis, and answered all the questions. They expressed understanding and appreciation. I also discussed with Dr. Virl Cagey.   Rosalin Hawking,  MD PhD Stroke Neurology 09/08/2022 2:45 PM    To contact Stroke Continuity provider, please refer to [email or web address] After hours, contact General Neurology

## 2022-09-08 NOTE — Progress Notes (Addendum)
Progress Note    09/08/2022 7:37 AM 1 Day Post-Op  Subjective:  says he is feeling good this morning   Vitals:   09/08/22 0600 09/08/22 0630  BP: 131/64 (!) 113/42  Pulse: 86 79  Resp: 18 20  Temp:    SpO2: 96% 97%   Physical Exam: Cardiac:  regular Lungs:  non labored Incisions:  left groin incision is intact, staples present, there is some maceration of skin along incision, SS drainage. Dry 4x4s, Abd and tape applied Extremities:  BLE well perfused and warm with doppler DP and PT signals. Motor and sensation diminished on left, unchanged Abdomen: soft Neurologic: alert and oriented  CBC    Component Value Date/Time   WBC 29.0 (H) 09/08/2022 0329   RBC 3.21 (L) 09/08/2022 0329   HGB 10.0 (L) 09/08/2022 0329   HGB 10.4 (L) 06/18/2021 1431   HCT 29.9 (L) 09/08/2022 0329   HCT 31.6 (L) 06/18/2021 1431   PLT 231 09/08/2022 0329   PLT 142 (L) 06/18/2021 1431   MCV 93.1 09/08/2022 0329   MCV 96 06/18/2021 1431   MCH 31.2 09/08/2022 0329   MCHC 33.4 09/08/2022 0329   RDW 18.7 (H) 09/08/2022 0329   RDW 14.3 06/18/2021 1431   LYMPHSABS 1.5 08/28/2022 1810   LYMPHSABS 1.5 06/18/2021 1431   MONOABS 0.7 08/28/2022 1810   EOSABS 0.1 08/28/2022 1810   EOSABS 1.0 (H) 06/18/2021 1431   BASOSABS 0.1 08/28/2022 1810   BASOSABS 0.1 06/18/2021 1431    BMET    Component Value Date/Time   NA 134 (L) 09/08/2022 0329   NA 141 06/18/2021 1431   K 4.0 09/08/2022 0329   CL 95 (L) 09/08/2022 0329   CO2 23 09/08/2022 0329   GLUCOSE 235 (H) 09/08/2022 0329   BUN 43 (H) 09/08/2022 0329   BUN 20 06/18/2021 1431   CREATININE 4.77 (H) 09/08/2022 0329   CREATININE 1.15 02/19/2017 1049   CALCIUM 8.3 (L) 09/08/2022 0329   CALCIUM 8.5 (L) 06/10/2020 0900   GFRNONAA 13 (L) 09/08/2022 0329   GFRAA 6 (L) 03/04/2020 1045    INR    Component Value Date/Time   INR 1.2 08/12/2022 1501     Intake/Output Summary (Last 24 hours) at 09/08/2022 0737 Last data filed at 09/08/2022  0248 Gross per 24 hour  Intake 510 ml  Output 2510 ml  Net -2000 ml     Assessment/Plan:  67 y.o. male is   [facility] placed yesterday. Functioning well. Dialyzed yesterday evening Left groin with some fullness. Staples intact. SS drainage. Dry dressings applied H&H stable WBC continues to go up 29K this morning Concern that left groin is source of infection. Will discuss with Dr. Virl Cagey TEE showed no vegetations Remains on Vanc Continue PT/ OT  Karoline Caldwell, [place] [facility] [phone] 09/08/2022 7:37 AM.  VASCULAR STAFF ADDENDUM: I have independently interviewed and examined the patient. I agree with the above.  Shuron is better on exam today, conversational, lucid, alert and oriented Since seen last on Friday, the left groin now has serosanguineous drainage.  This is concerning for poor healing. Close white count remains elevated 30.  He continues on broad-spectrum antibiotics.  Most recent blood cultures negative for MRSA. Echo negative for vegetations, likely source being the left groin stents versus patch.  The stents are not removable.  Zaccheaus will need lifelong suppressive antibiotics at this point. The poor wound healing in the left groin needs to be addressed.  He would benefit  from left groin washout, patch assessment, antibiotic bead placement, muscle flap coverage, biologic powder placement. I have polled my group regarding patch excision, with replasty using left-sided vein.  The concern with this is that with his comorbidities, the tissue may be too friable to hold suture.  No further bleeding events from the left groin, and I think the only reason to remove the patch would be a bleed. Recommend CTA abdomen pelvis with runoff today to evaluated the groin further.  Possible OR tomorrow afternoon pending availability. Please make NPO midnight. Continue current medication regimen. Dry dressings BID to groin.  Khalid and his daughter are aware he remains  high risk for further groin complications including blow-out.  From a functional standpoint, Karac has had minimal ambulation since his admission. The left foot is now locked in plantar flexion. There continues to be a weak peroneal signal. Victor is aware the groin surgery is for wound healing and not limb salvage. He is aware he will likely require AKA in the future.    Cassandria Santee, MD [facility] Phone Number: [phone] 09/08/2022 1:14 PM

## 2022-09-08 NOTE — Progress Notes (Signed)
PHARMACY CONSULT NOTE FOR:  Discharge IV antibiotics to be received at hemodialysis  Indication: MRSA bacteremia Regimen: Vancomycin with hemodialysis (currently 750mg  after dialysis) End date: 10/28/2022  IV antibiotic discharge orders are pended. To discharging provider:  please sign these orders via discharge navigator,  Select New Orders & click on the button choice - Manage This Unsigned Work.     Thank you for allowing pharmacy to be a part of this patient's care.  Candie Mile 09/08/2022, 1:18 PM

## 2022-09-08 NOTE — Progress Notes (Signed)
Post HD Tx   09/08/22 0248  Vitals  Temp 98.1 F (36.7 C)  Pulse Rate 91  Resp 20  BP (!) 117/54  SpO2 100 %  O2 Device Room Air  Oxygen Therapy  Patient Activity (if Appropriate) In bed  Pulse Oximetry Type Continuous  Oximetry Probe Site Changed No  Post Treatment  Dialyzer Clearance Lightly streaked  Duration of HD Treatment -hour(s) 3.5 hour(s)  Liters Processed 70.7  Fluid Removed (mL) 2500 mL  Tolerated HD Treatment Yes  Post-Hemodialysis Comments Tx tolerated with asymptomatic hypotension, goal reduced to 2556mL

## 2022-09-08 NOTE — Progress Notes (Signed)
  Progress Note   Patient: Albert Harris [id number] DOB: 1955/09/17 DOA: 08/28/2022     11 DOS: the patient was seen and examined on 09/08/2022   Brief hospital course: Patient is a 67 year old male with history of end-stage renal disease with AV fistula, peripheral arterial disease, COPD, essential hypertension, chronic systolic congestive heart failure with ejection fraction 35 to 40%, who present to the hospital with complaints of abdominal pain nausea and diaphoresis.  Patient was also hypotensive with a lactic acidosis.  With a pulseless left lower extremity.  She was found to have femoral pseudoaneurysm.  Culture came back with MRSA bacteremia.  OR with Dr. Virl Cagey for left groin exploration, hematoma evacuation on 3/23. OR findings showed dehiscence at proximal aspect of bovine pericardial patch at the suture line.  Patient had required pressor for septic shock.  Due to severe hyperkalemia, patient was had a emergent CRRT. Patient currently on vancomycin, followed by ID, planning 8 weeks IV antibiotics followed with suppressive treatment.  TEE did not show any vegetation.   Principal Problem:   Lactic acidosis Active Problems:   Metabolic encephalopathy   ESRD (end stage renal disease) on dialysis   Malnutrition of moderate degree   Septic shock   Pseudoaneurysm of femoral artery   Decreased functional mobility   Assessment and Plan: Septic shock secondary to MRSA septicemia. MRSA septicemia secondary to groin wound infection. Right groin hematoma with left femoral arteries pseudoaneurysm. Discussed with ID, patient will need 8 weeks of IV vancomycin followed by suppressive treatment.  Acute respiratory failure with hypoxemia.   COPD. Chronic systolic congestive heart failure. Pulmonary hypertension. Patient condition seem to be improving, continue to follow.  End-stage renal disease. Hyperkalemia. Metabolic acidosis. Hyponatremia. Stabilized on dialysis.  Anemia of  chronic kidney disease. Continue to follow  Severe protein calorie malnutrition. Patient has a BMI of 14.7.  Continue protein supplement.   Subjective:  Patient doing well today, some pain in the groin.  Physical Exam: Vitals:   09/08/22 0700 09/08/22 0740 09/08/22 0800 09/08/22 0900  BP: (!) 134/93  117/66 120/66  Pulse: 85  99 81  Resp: 19  (!) 28   Temp:  97.8 F (36.6 C)    TempSrc:  Oral    SpO2: 95%  100% 98%  Weight:      Height:       General exam: Appears calm and comfortable, severely malnourished. Respiratory system: Clear to auscultation. Respiratory effort normal. Cardiovascular system: S1 & S2 heard, RRR. No JVD, murmurs, rubs, gallops or clicks. No pedal edema. Gastrointestinal system: Abdomen is nondistended, soft and nontender. No organomegaly or masses felt. Normal bowel sounds heard. Central nervous system: Alert and oriented. No focal neurological deficits. Extremities: Symmetric 5 x 5 power. Skin: No rashes, lesions or ulcers Psychiatry: Judgement and insight appear normal. Mood & affect appropriate.    Data Reviewed:  Reviewed all imaging studies during this hospitalization, reviewed lab results.  Family Communication: None  Disposition: Status is: Inpatient Remains inpatient appropriate because: Severity of disease, IV antibiotics.     Time spent: 50 minutes  Author: Sharen Hones, MD 09/08/2022 2:49 PM  For on call review [email or web address].

## 2022-09-08 NOTE — Progress Notes (Signed)
[facility] Progress Note  Subjective: got HD last night after [facility] placement and did well w/ dialysis.   Vitals:   09/08/22 0700 09/08/22 0740 09/08/22 0800 09/08/22 0900  BP: (!) 134/93  117/66 120/66  Pulse: 85  99 81  Resp: 19  (!) 28   Temp:  97.8 F (36.6 C)    TempSrc:  Oral    SpO2: 95%  100% 98%  Weight:      Height:        Exam: Constitutional: lying in bed, alert and pleasant ENMT: ears and nose without scars or lesions, MMM CV: normal rate, no edema Respiratory: bilateral chest rise, no iwob Gastrointestinal: soft, nonended Skin: no visible lesions or rashes Psych: mood and affect appropriate  Access: Right upper extremity AVG, no bruit or thrill  L fem wound covered w/ dressing    OP HD: TTS SW  4h   350/800  64.5kg  3K/2.5Ca bath  RFA AVG   Heparin none  - last OP HD 3/14, post wt 65.9kg - venofer 50mg  IV weekly - rocaltrol 0.25 mcg po tiw - no esa, last Hb 11.0  Summary: presented 08/21/22 w/ acute pain/ ischemic L foot. Hx of L EIA stenting on 3/04, and prior RLE bypass. Pt underwent endarterectomy of the left common femoral, profundofemoral, superficial femoral and external iliac vessels with bovine pericardial patch angioplasty and a left iliofemoral and profundofemoral thromboembolectomy on 3/15. Went to ICU until 3/18 for hypotension. [place] home on 3/18. Pt then returned on 3/22 to ED w/ N/V and unable to feel his L foot. BP's low in ED and was found to have L fem pseudoaneurysm. Had emergent CRRT for high K+. Then taken to OR 3/23 for primary repair of the distal EIA w/ hematoma evacuation. Extubated 3/24. BCx's from  admit were + for MRSA, IV vanc was started. On 3/26 was off CRRT and off pressors. R arm AVG was clotted and went for declot on 3/27 per VVS.  Pt had regular HD on 3/28 then temp cath was removed for line holiday. IR placed new TDC on 4/01 and pt had regular HD later the same day. As below.    Assessment/ Plan: Left common  femoral artery pseudoaneurysm - sp repair of L EIA on 3/23 by VVS.  ESRD: TTS HD.  SP CRRT 3/23- 3/26.  Received regular dialysis morning of 3/28 via temp cath. AVG clotted as below.  Had HD 4/01 after TDC placed by IR. Next HD tomorrow off schedule.  Volume - looks euvolemic to a bit dry on exam. Wts not accurate. Probably 4-6kg under dry wt CVA: stroke on MRI. Neuro signed off.  Shock - resolved MRSA Bacteremia - sp line holiday and getting IV vanc per pharmacy/ pmd. ID following Anemia: Hb 8-10 range. Likely multifactorial.  No IV iron given bacteremia. ESA ordered as darbe 100 mcg sq weekly on Wed.  Dialysis access: AVG clotted declotted on 3/27 per VVS then reclotted. Temp cath removed 3/28. IR placed new [place] on 09/07/22.    Kelly Splinter MD [facility] 09/08/2022, 11:47 AM  Recent Labs  Lab 09/07/22 0434 09/07/22 1535 09/08/22 0329  HGB  --  9.2* 10.0*  ALBUMIN 1.7*  --  2.1*  CALCIUM 8.3*  --  8.3*  PHOS 7.8*  --  4.4  CREATININE 8.70*  --  4.77*  K 4.5  --  4.0    No results for input(s): "IRON", "TIBC", "FERRITIN" in the last 168 hours. Inpatient  medications:  (feeding supplement) PROSource Plus  30 mL Oral BID BM   Chlorhexidine Gluconate Cloth  6 each Topical Q0600   darbepoetin (ARANESP) injection - DIALYSIS  100 mcg Subcutaneous Q Wed-1800   feeding supplement (NEPRO CARB STEADY)  237 mL Oral BID BM   heparin injection (subcutaneous)  5,000 Units Subcutaneous Q8H   multivitamin  1 tablet Oral QHS   pantoprazole  40 mg Oral Daily   rosuvastatin  10 mg Oral Daily   sodium chloride flush  10-40 mL Intracatheter Q12H   tetracaine  100 mg Infiltration Once   vancomycin variable dose per unstable renal function (pharmacist dosing)   Does not apply See admin instructions    sodium chloride 10 mL/hr at 09/08/22 0903   sodium chloride Stopped (09/01/22 0400)   Place/Maintain arterial line **AND** sodium chloride, acetaminophen **OR** acetaminophen, alteplase, docusate sodium,  heparin, hydrALAZINE, HYDROmorphone (DILAUDID) injection, ondansetron (ZOFRAN) IV, mouth rinse, oxyCODONE, pentafluoroprop-tetrafluoroeth, polyethylene glycol, sodium chloride flush

## 2022-09-08 NOTE — Progress Notes (Addendum)
[facility]  Date of Admission:  08/28/2022     Principal Problem:   Lactic acidosis Active Problems:   Metabolic encephalopathy   ESRD (end stage renal disease) on dialysis   Malnutrition of moderate degree   Septic shock   Pseudoaneurysm of femoral artery   Decreased functional mobility          Assessment: 67 year old male with past medical history of MRSA bacteremia in 2022, PAD, ESRD HD, recently hospitalized 3/16-18 for acute on chronic left lower extremity ischemia.  He had undergone femoral endarterectomy with bovine pericardial patch, left iliofemoral profundofemoral thromboembolectomy, stent of the left common and external iliac arteries 3/16 with Dr. 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 - On 3/22 CT angio showed 1.1 cm left common femoral artery pseudoaneurysm, new.  New anterior pelvic extraperitoneal hematoma. -OR with Dr. Virl Cagey for left groin exploration, hematoma evacuation on 3/23. OR findings showed dehiscence at proximal aspect of bovine pericardial patch at the suture line. -Per OR note on 3/16 there is a stent in the left common and external iliac arteries.  The stents are left in place, patient will need to be on suppressive antibiotics following completion of likely 4-6 weeks of IV therapy. -MRI brain showed 4 mm acute infarct in right subinsular white matter on 3/29 -MRI L spine negative for infection, MR C spine showed improvement of marrow edema at C5-6. 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 -CT AP on 3/30 showed extension of hemorrhage in  extraperitoneal lateral pelvic wall.  -TTE showed AV/MC valve thickening. TEE no vegetation - No plans at this time for bovine patch/stent removal given risks. From the  antibiotic perspective will plan on suppressive regimen once treatment IV course complete.  Recommendations: - Continue vancomycin to complete 8 weeks of abx(for possible cervical osteo) from line removal on 3/28 EOT 5/22 with iHD then transition to doxy 100mg  PO bid for chronic suppression. May need to change end dates pending clinical progression if pt taken to OR again.  - F/U with myself in clinic on 4/22 with myself   #Leukocytosis -wbc 29k -Left groin staples intact, no surrounding erythema -Vascular following, I think could be reactive given recent procedures including thrombectomy on 3/27, IJ /TEE on 4/1.  Given large hemorrhage on CT on 3/30 infection can not be excluded    #History of ESRD on intermittent HD via right AV fistula -R AV fistula thrombectomy attempted and aborted on 3/28 due to severe HTN  ID will follow peripherally  Microbiology:   Antibiotics: Vancomycin 3/24-   Cultures: Blood 3/23 2/2 MRSA  3/25 GPC 3/27   SUBJECTIVE: Resting in  bed. No new complaints.  Interval:  Afebrile overnight. Wbc 29k Review of Systems: Review of Systems  All other systems reviewed and are negative.    Scheduled Meds:  (feeding supplement) PROSource Plus  30 mL Oral BID BM   Chlorhexidine Gluconate Cloth  6 each Topical Q0600   darbepoetin (ARANESP) injection - DIALYSIS  100 mcg Subcutaneous Q Wed-1800   feeding supplement (NEPRO CARB STEADY)  237 mL Oral BID BM   heparin injection (  subcutaneous)  5,000 Units Subcutaneous Q8H   multivitamin  1 tablet Oral QHS   pantoprazole  40 mg Oral Daily   rosuvastatin  10 mg Oral Daily   sodium chloride flush  10-40 mL Intracatheter Q12H   tetracaine  100 mg Infiltration Once   vancomycin variable dose per unstable renal function (pharmacist dosing)   Does  not apply See admin instructions   Continuous Infusions:  sodium chloride 10 mL/hr at 09/08/22 0903   sodium chloride Stopped (09/01/22 0400)   PRN Meds:.Place/Maintain arterial line **AND** sodium chloride, acetaminophen **OR** acetaminophen, alteplase, docusate sodium, heparin, hydrALAZINE, HYDROmorphone (DILAUDID) injection, ondansetron (ZOFRAN) IV, mouth rinse, oxyCODONE, pentafluoroprop-tetrafluoroeth, polyethylene glycol, sodium chloride flush Allergies  Allergen Reactions   Benadryl [Diphenhydramine] Rash   Lidocaine-Prilocaine Rash    Caused red marks up and down arm    OBJECTIVE: Vitals:   09/08/22 0700 09/08/22 0740 09/08/22 0800 09/08/22 0900  BP: (!) 134/93  117/66 120/66  Pulse: 85  99 81  Resp: 19  (!) 28   Temp:  97.8 F (36.6 C)    TempSrc:  Oral    SpO2: 95%  100% 98%  Weight:      Height:       Body mass index is 14.72 kg/m.  Physical Exam Constitutional:      General: He is not in acute distress.    Appearance: He is normal weight. He is not toxic-appearing.  HENT:     Head: Normocephalic and atraumatic.     Right Ear: External ear normal.     Left Ear: External ear normal.     Nose: No congestion or rhinorrhea.     Mouth/Throat:     Mouth: Mucous membranes are moist.     Pharynx: Oropharynx is clear.  Eyes:     Extraocular Movements: Extraocular movements intact.     Conjunctiva/sclera: Conjunctivae normal.     Pupils: Pupils are equal, round, and reactive to light.  Cardiovascular:     Rate and Rhythm: Normal rate and regular rhythm.     Heart sounds: No murmur heard.    No friction rub. No gallop.  Pulmonary:     Effort: Pulmonary effort is normal.     Breath sounds: Normal breath sounds.  Abdominal:     General: Abdomen is flat. Bowel sounds are normal.     Palpations: Abdomen is soft.  Musculoskeletal:        General: No swelling.     Cervical back: Normal range of motion and neck supple.     Comments: Left groin wound with staples   Skin:    General: Skin is warm and dry.     Comments: Wound vac  Neurological:     General: No focal deficit present.     Mental Status: He is oriented to person, place, and time.  Psychiatric:        Mood and Affect: Mood normal.       Lab Results Lab Results  Component Value Date   WBC 29.0 (H) 09/08/2022   HGB 10.0 (L) 09/08/2022   HCT 29.9 (L) 09/08/2022   MCV 93.1 09/08/2022   PLT 231 09/08/2022    Lab Results  Component Value Date   CREATININE 4.77 (H) 09/08/2022   BUN 43 (H) 09/08/2022   NA 134 (L) 09/08/2022   K 4.0 09/08/2022   CL 95 (L) 09/08/2022   CO2 23 09/08/2022    Lab Results  Component Value Date   ALT <5  08/28/2022   AST 21 08/28/2022   ALKPHOS 122 08/28/2022   BILITOT 1.2 08/28/2022        Laurice Record, MD [facility] 09/08/2022, 11:20 AM

## 2022-09-08 NOTE — Progress Notes (Signed)
Pharmacy Antibiotic Note  Albert Harris is a 67 y.o. 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 Now with MRSA bacteremia. Noted patient is ESRD HD TTS PTA, with a right arm AV graft and a catheter in his right IJ. Pharmacy has been consulted for vancomycin dosing.   Patient received HD on 4/1 PM x3.5 hrs  Plan: Vancomycin 750 mg IV x 1 Evaluate levels as appropriate  Monitor HD schedule, clinical progression  Height: 5\' 8"  (172.7 cm) Weight: 43.9 kg (96 lb 12.5 oz) IBW/kg (Calculated) : 68.4  Temp (24hrs), Avg:98.3 F (36.8 C), Min:97.8 F (36.6 C), Max:99.2 F (37.3 C)  Recent Labs  Lab 09/02/22 0424 09/02/22 1659 09/03/22 0624 09/04/22 0838 09/05/22 0053 09/05/22 1405 09/05/22 1919 09/06/22 0608 09/06/22 1435 09/07/22 0233 09/07/22 0434 09/07/22 1535 09/08/22 0329  WBC 28.7*  --    < > 19.4* 19.9* 19.7*   < > 19.8* 24.3* 24.4*  --  29.8* 29.0*  CREATININE 3.92*  --    < > 4.21* 5.41*  --   --  7.28*  --   --  8.70*  --  4.77*  LATICACIDVEN  --  1.0  --   --   --   --   --   --   --   --   --   --   --   VANCORANDOM 30  --   --   --   --  14  --   --   --   --   --   --   --    < > = values in this interval not displayed.     Estimated Creatinine Clearance: 9.2 mL/min (A) (by C-G formula based on SCr of 4.77 mg/dL (H)).    Allergies  Allergen Reactions   Benadryl [Diphenhydramine] Rash   Lidocaine-Prilocaine Rash    Caused red marks up and down arm   Antimicrobials this admission:  Cefazolin 3/23 x 1 Vancomycin 3/24 >>   Microbiology results:  3/27 Bcx: NGx4 3/25 Bcx: GPCs 1/4 bottles  3/23 Bcx: 3/4 bottles MRSA   Wilson Singer, PharmD Clinical Pharmacist 09/08/2022 9:41 AM

## 2022-09-08 NOTE — Progress Notes (Signed)
Report given and patient transferred to 2W12, all questions answered. Daughter at bedside.

## 2022-09-09 ENCOUNTER — Inpatient Hospital Stay (HOSPITAL_COMMUNITY): Payer: Medicare Other | Admitting: Certified Registered Nurse Anesthetist

## 2022-09-09 ENCOUNTER — Encounter (HOSPITAL_COMMUNITY): Payer: Self-pay | Admitting: Pulmonary Disease

## 2022-09-09 ENCOUNTER — Inpatient Hospital Stay (HOSPITAL_COMMUNITY): Payer: Medicare Other

## 2022-09-09 ENCOUNTER — Encounter (HOSPITAL_COMMUNITY)
Admission: EM | Disposition: A | Discharge status: Hospice | Payer: Self-pay | Source: Home / Self Care | Attending: Internal Medicine

## 2022-09-09 DIAGNOSIS — I739 Peripheral vascular disease, unspecified: Secondary | ICD-10-CM

## 2022-09-09 DIAGNOSIS — I509 Heart failure, unspecified: Secondary | ICD-10-CM | POA: Diagnosis not present

## 2022-09-09 DIAGNOSIS — I132 Hypertensive heart and chronic kidney disease with heart failure and with stage 5 chronic kidney disease, or end stage renal disease: Secondary | ICD-10-CM

## 2022-09-09 DIAGNOSIS — T827XXA Infection and inflammatory reaction due to other cardiac and vascular devices, implants and grafts, initial encounter: Principal | ICD-10-CM

## 2022-09-09 DIAGNOSIS — E872 Acidosis, unspecified: Secondary | ICD-10-CM | POA: Diagnosis not present

## 2022-09-09 DIAGNOSIS — N186 End stage renal disease: Secondary | ICD-10-CM

## 2022-09-09 DIAGNOSIS — D631 Anemia in chronic kidney disease: Secondary | ICD-10-CM

## 2022-09-09 DIAGNOSIS — Z87891 Personal history of nicotine dependence: Secondary | ICD-10-CM

## 2022-09-09 DIAGNOSIS — Z992 Dependence on renal dialysis: Secondary | ICD-10-CM

## 2022-09-09 HISTORY — PX: ULTRASOUND GUIDANCE FOR VASCULAR ACCESS: SHX6516

## 2022-09-09 HISTORY — PX: AORTOILIAC BYPASS: SHX6417

## 2022-09-09 HISTORY — PX: AORTOGRAM: SHX6300

## 2022-09-09 HISTORY — PX: VEIN HARVEST: SHX6363

## 2022-09-09 HISTORY — PX: FEMORAL ARTERY EXPLORATION: SHX5160

## 2022-09-09 LAB — BASIC METABOLIC PANEL
Anion gap: 14 (ref 5–15)
BUN: 62 mg/dL — ABNORMAL HIGH (ref 8–23)
CO2: 22 mmol/L (ref 22–32)
Calcium: 7.7 mg/dL — ABNORMAL LOW (ref 8.9–10.3)
Chloride: 99 mmol/L (ref 98–111)
Creatinine, Ser: 5.2 mg/dL — ABNORMAL HIGH (ref 0.61–1.24)
GFR, Estimated: 11 mL/min — ABNORMAL LOW (ref >60)
Glucose, Bld: 117 mg/dL — ABNORMAL HIGH (ref 70–99)
Potassium: 4.9 mmol/L (ref 3.5–5.1)
Sodium: 135 mmol/L (ref 135–145)

## 2022-09-09 LAB — CBC
HCT: 26.3 % — ABNORMAL LOW (ref 39.0–52.0)
HCT: 29.4 % — ABNORMAL LOW (ref 39.0–52.0)
Hemoglobin: 8.2 g/dL — ABNORMAL LOW (ref 13.0–17.0)
Hemoglobin: 9.7 g/dL — ABNORMAL LOW (ref 13.0–17.0)
MCH: 30.4 pg (ref 26.0–34.0)
MCH: 31.2 pg (ref 26.0–34.0)
MCHC: 31.2 g/dL (ref 30.0–36.0)
MCHC: 33 g/dL (ref 30.0–36.0)
MCV: 97.4 fL (ref 80.0–100.0)
MCV: 94.5 fL (ref 80.0–100.0)
Platelets: 237 10*3/uL (ref 150–400)
Platelets: 205 10*3/uL (ref 150–400)
RBC: 2.7 MIL/uL — ABNORMAL LOW (ref 4.22–5.81)
RBC: 3.11 MIL/uL — ABNORMAL LOW (ref 4.22–5.81)
RDW: 18.6 % — ABNORMAL HIGH (ref 11.5–15.5)
RDW: 17.3 % — ABNORMAL HIGH (ref 11.5–15.5)
WBC: 20.9 10*3/uL — ABNORMAL HIGH (ref 4.0–10.5)
WBC: 23.1 10*3/uL — ABNORMAL HIGH (ref 4.0–10.5)
nRBC: 0 % (ref 0.0–0.2)
nRBC: 0 % (ref 0.0–0.2)

## 2022-09-09 LAB — POCT I-STAT 7, (LYTES, BLD GAS, ICA,H+H)
Acid-base deficit: 2 mmol/L (ref 0.0–2.0)
Acid-base deficit: 4 mmol/L — ABNORMAL HIGH (ref 0.0–2.0)
Acid-base deficit: 3 mmol/L — ABNORMAL HIGH (ref 0.0–2.0)
Bicarbonate: 24.9 mmol/L (ref 20.0–28.0)
Bicarbonate: 21.1 mmol/L (ref 20.0–28.0)
Bicarbonate: 22.9 mmol/L (ref 20.0–28.0)
Calcium, Ion: 1.04 mmol/L — ABNORMAL LOW (ref 1.15–1.40)
Calcium, Ion: 1.02 mmol/L — ABNORMAL LOW (ref 1.15–1.40)
Calcium, Ion: 1 mmol/L — ABNORMAL LOW (ref 1.15–1.40)
HCT: 31 % — ABNORMAL LOW (ref 39.0–52.0)
HCT: 26 % — ABNORMAL LOW (ref 39.0–52.0)
HCT: 26 % — ABNORMAL LOW (ref 39.0–52.0)
Hemoglobin: 10.5 g/dL — ABNORMAL LOW (ref 13.0–17.0)
Hemoglobin: 8.8 g/dL — ABNORMAL LOW (ref 13.0–17.0)
Hemoglobin: 8.8 g/dL — ABNORMAL LOW (ref 13.0–17.0)
O2 Saturation: 99 %
O2 Saturation: 99 %
O2 Saturation: 98 %
Patient temperature: 36.7
Potassium: 5.3 mmol/L — ABNORMAL HIGH (ref 3.5–5.1)
Potassium: 4.9 mmol/L (ref 3.5–5.1)
Potassium: 6.3 mmol/L (ref 3.5–5.1)
Sodium: 134 mmol/L — ABNORMAL LOW (ref 135–145)
Sodium: 136 mmol/L (ref 135–145)
Sodium: 134 mmol/L — ABNORMAL LOW (ref 135–145)
TCO2: 26 mmol/L (ref 22–32)
TCO2: 22 mmol/L (ref 22–32)
TCO2: 24 mmol/L (ref 22–32)
pCO2 arterial: 50.4 mmHg — ABNORMAL HIGH (ref 32–48)
pCO2 arterial: 37.7 mmHg (ref 32–48)
pCO2 arterial: 41.2 mmHg (ref 32–48)
pH, Arterial: 7.303 — ABNORMAL LOW (ref 7.35–7.45)
pH, Arterial: 7.356 (ref 7.35–7.45)
pH, Arterial: 7.352 (ref 7.35–7.45)
pO2, Arterial: 176 mmHg — ABNORMAL HIGH (ref 83–108)
pO2, Arterial: 139 mmHg — ABNORMAL HIGH (ref 83–108)
pO2, Arterial: 115 mmHg — ABNORMAL HIGH (ref 83–108)

## 2022-09-09 LAB — POCT I-STAT, CHEM 8
BUN: 56 mg/dL — ABNORMAL HIGH (ref 8–23)
Calcium, Ion: 1.13 mmol/L — ABNORMAL LOW (ref 1.15–1.40)
Chloride: 98 mmol/L (ref 98–111)
Creatinine, Ser: 5.9 mg/dL — ABNORMAL HIGH (ref 0.61–1.24)
Glucose, Bld: 104 mg/dL — ABNORMAL HIGH (ref 70–99)
HCT: 28 % — ABNORMAL LOW (ref 39.0–52.0)
Hemoglobin: 9.5 g/dL — ABNORMAL LOW (ref 13.0–17.0)
Potassium: 3.9 mmol/L (ref 3.5–5.1)
Sodium: 135 mmol/L (ref 135–145)
TCO2: 25 mmol/L (ref 22–32)

## 2022-09-09 LAB — RENAL FUNCTION PANEL
Albumin: 1.9 g/dL — ABNORMAL LOW (ref 3.5–5.0)
Anion gap: 15 (ref 5–15)
BUN: 80 mg/dL — ABNORMAL HIGH (ref 8–23)
CO2: 23 mmol/L (ref 22–32)
Calcium: 8.3 mg/dL — ABNORMAL LOW (ref 8.9–10.3)
Chloride: 98 mmol/L (ref 98–111)
Creatinine, Ser: 6.51 mg/dL — ABNORMAL HIGH (ref 0.61–1.24)
GFR, Estimated: 9 mL/min — ABNORMAL LOW (ref >60)
Glucose, Bld: 120 mg/dL — ABNORMAL HIGH (ref 70–99)
Phosphorus: 6.3 mg/dL — ABNORMAL HIGH (ref 2.5–4.6)
Potassium: 4.2 mmol/L (ref 3.5–5.1)
Sodium: 136 mmol/L (ref 135–145)

## 2022-09-09 LAB — POCT ACTIVATED CLOTTING TIME
Activated Clotting Time: 223 seconds
Activated Clotting Time: 255 seconds
Activated Clotting Time: 228 seconds
Activated Clotting Time: 282 seconds
Activated Clotting Time: 304 seconds

## 2022-09-09 LAB — PREPARE RBC (CROSSMATCH)

## 2022-09-09 LAB — SURGICAL PCR SCREEN
MRSA, PCR: NEGATIVE
Staphylococcus aureus: NEGATIVE

## 2022-09-09 LAB — GLUCOSE, CAPILLARY: Glucose-Capillary: 114 mg/dL — ABNORMAL HIGH (ref 70–99)

## 2022-09-09 SURGERY — EXPLORATION, ARTERY, FEMORAL
Anesthesia: General | Site: Groin | Laterality: Right

## 2022-09-09 MED ORDER — OXYCODONE HCL 5 MG PO TABS: 5.0000 mg | ORAL_TABLET | Freq: Once | ORAL | Status: DC | PRN

## 2022-09-09 MED ORDER — EPHEDRINE SULFATE-NACL 50-0.9 MG/10ML-% IV SOSY
PREFILLED_SYRINGE | INTRAVENOUS | Status: DC | PRN
  Administered 2022-09-09: 5 mg via INTRAVENOUS

## 2022-09-09 MED ORDER — ALBUMIN HUMAN 5 % IV SOLN: INTRAVENOUS | Status: DC | PRN

## 2022-09-09 MED ORDER — SODIUM CHLORIDE 0.9 % IV SOLN: INTRAVENOUS | Status: DC

## 2022-09-09 MED ORDER — ONDANSETRON HCL 4 MG/2ML IJ SOLN: 4.0000 mg | Freq: Four times a day (QID) | INTRAMUSCULAR | Status: DC | PRN

## 2022-09-09 MED ORDER — CEFAZOLIN SODIUM-DEXTROSE 2-4 GM/100ML-% IV SOLN
INTRAVENOUS | Status: AC
  Filled 2022-09-09: qty 100

## 2022-09-09 MED ORDER — HYDROMORPHONE 1 MG/ML IV SOLN
INTRAVENOUS | Status: DC
  Administered 2022-09-10 – 2022-09-11 (×7): 30 mg via INTRAVENOUS
  Administered 2022-09-11: 1.2 mg via INTRAVENOUS
  Administered 2022-09-11: 0.9 mg via INTRAVENOUS
  Administered 2022-09-11: 30 mg via INTRAVENOUS
  Administered 2022-09-11 – 2022-09-12 (×2): 1.5 mg via INTRAVENOUS
  Administered 2022-09-12: 0.3 mg via INTRAVENOUS
  Administered 2022-09-12: 0.6 mg via INTRAVENOUS
  Administered 2022-09-13: 3.3 mg via INTRAVENOUS
  Administered 2022-09-13: 1.5 mg via INTRAVENOUS
  Administered 2022-09-13: 1.8 mg via INTRAVENOUS
  Administered 2022-09-13: 30 mg via INTRAVENOUS
  Administered 2022-09-14: 3 mg via INTRAVENOUS
  Administered 2022-09-14: 2.1 mg via INTRAVENOUS
  Administered 2022-09-14 – 2022-09-15 (×3): 1.5 mg via INTRAVENOUS
  Administered 2022-09-15: 30 mg via INTRAVENOUS
  Administered 2022-09-15: 1.8 mg via INTRAVENOUS
  Administered 2022-09-15: 1.5 mg via INTRAVENOUS
  Administered 2022-09-15 – 2022-09-16 (×2): 1.8 mg via INTRAVENOUS
  Administered 2022-09-16: 1.5 mg via INTRAVENOUS
  Filled 2022-09-09 (×3): qty 30

## 2022-09-09 MED ORDER — HEPARIN 6000 UNIT IRRIGATION SOLUTION
Status: AC
  Filled 2022-09-09: qty 500

## 2022-09-09 MED ORDER — PROPOFOL 10 MG/ML IV BOLUS
INTRAVENOUS | Status: AC
  Filled 2022-09-09: qty 20

## 2022-09-09 MED ORDER — VANCOMYCIN HCL 500 MG/100ML IV SOLN
500.0000 mg | INTRAVENOUS | Status: DC
  Filled 2022-09-09: qty 100

## 2022-09-09 MED ORDER — MIDAZOLAM HCL 2 MG/2ML IJ SOLN
INTRAMUSCULAR | Status: AC
  Filled 2022-09-09: qty 2

## 2022-09-09 MED ORDER — ACETAMINOPHEN 10 MG/ML IV SOLN
1000.0000 mg | Freq: Once | INTRAVENOUS | Status: DC | PRN
  Administered 2022-09-09: 1000 mg via INTRAVENOUS

## 2022-09-09 MED ORDER — SODIUM CHLORIDE 0.9 % IV SOLN
10.0000 mL/h | Freq: Once | INTRAVENOUS | Status: AC
  Administered 2022-09-09: 10 mL/h via INTRAVENOUS

## 2022-09-09 MED ORDER — HEPARIN SODIUM (PORCINE) 1000 UNIT/ML IJ SOLN
INTRAMUSCULAR | Status: DC | PRN
  Administered 2022-09-09: 3000 [IU] via INTRAVENOUS
  Administered 2022-09-09: 7000 [IU] via INTRAVENOUS
  Administered 2022-09-09: 3000 [IU] via INTRAVENOUS

## 2022-09-09 MED ORDER — ACETAMINOPHEN 160 MG/5ML PO SOLN: 1000.0000 mg | Freq: Once | ORAL | Status: DC | PRN

## 2022-09-09 MED ORDER — FENTANYL CITRATE (PF) 250 MCG/5ML IJ SOLN
INTRAMUSCULAR | Status: DC | PRN
  Administered 2022-09-09: 75 ug via INTRAVENOUS

## 2022-09-09 MED ORDER — OXYCODONE HCL 5 MG/5ML PO SOLN: 5.0000 mg | Freq: Once | ORAL | Status: DC | PRN

## 2022-09-09 MED ORDER — PROTAMINE SULFATE 10 MG/ML IV SOLN
INTRAVENOUS | Status: DC | PRN
  Administered 2022-09-09: 20 mg via INTRAVENOUS
  Administered 2022-09-09: 10 mg via INTRAVENOUS
  Administered 2022-09-09: 20 mg via INTRAVENOUS

## 2022-09-09 MED ORDER — MIDAZOLAM HCL 2 MG/2ML IJ SOLN
INTRAMUSCULAR | Status: DC | PRN
  Administered 2022-09-09: 1 mg via INTRAVENOUS

## 2022-09-09 MED ORDER — SODIUM CHLORIDE 0.9 % IV SOLN: 500.0000 mL | Freq: Once | INTRAVENOUS | Status: DC | PRN

## 2022-09-09 MED ORDER — CHLORHEXIDINE GLUCONATE 0.12 % MT SOLN
OROMUCOSAL | Status: AC
  Administered 2022-09-09: 15 mL
  Filled 2022-09-09: qty 15

## 2022-09-09 MED ORDER — ACETAMINOPHEN 500 MG PO TABS: 1000.0000 mg | ORAL_TABLET | Freq: Once | ORAL | Status: DC | PRN

## 2022-09-09 MED ORDER — SODIUM CHLORIDE 0.9% FLUSH: 9.0000 mL | INTRAVENOUS | Status: DC | PRN

## 2022-09-09 MED ORDER — 0.9 % SODIUM CHLORIDE (POUR BTL) OPTIME
TOPICAL | Status: DC | PRN
  Administered 2022-09-09: 1000 mL

## 2022-09-09 MED ORDER — MAGNESIUM SULFATE 2 GM/50ML IV SOLN: 2.0000 g | Freq: Every day | INTRAVENOUS | Status: DC | PRN

## 2022-09-09 MED ORDER — POTASSIUM CHLORIDE CRYS ER 20 MEQ PO TBCR: 20.0000 meq | EXTENDED_RELEASE_TABLET | Freq: Every day | ORAL | Status: DC | PRN

## 2022-09-09 MED ORDER — SODIUM CHLORIDE 0.9 % IV SOLN: INTRAVENOUS | Status: DC | PRN

## 2022-09-09 MED ORDER — PHENYLEPHRINE HCL-NACL 20-0.9 MG/250ML-% IV SOLN
INTRAVENOUS | Status: DC | PRN
  Administered 2022-09-09: 25 ug/min via INTRAVENOUS

## 2022-09-09 MED ORDER — FENTANYL CITRATE (PF) 100 MCG/2ML IJ SOLN
INTRAMUSCULAR | Status: AC
  Filled 2022-09-09: qty 2

## 2022-09-09 MED ORDER — ONDANSETRON HCL 4 MG/2ML IJ SOLN
INTRAMUSCULAR | Status: DC | PRN
  Administered 2022-09-09: 4 mg via INTRAVENOUS

## 2022-09-09 MED ORDER — PHENYLEPHRINE 80 MCG/ML (10ML) SYRINGE FOR IV PUSH (FOR BLOOD PRESSURE SUPPORT)
PREFILLED_SYRINGE | INTRAVENOUS | Status: DC | PRN
  Administered 2022-09-09 (×2): 80 ug via INTRAVENOUS
  Administered 2022-09-09: 160 ug via INTRAVENOUS
  Administered 2022-09-09 (×6): 80 ug via INTRAVENOUS

## 2022-09-09 MED ORDER — PIPERACILLIN-TAZOBACTAM IN DEX 2-0.25 GM/50ML IV SOLN
2.2500 g | Freq: Three times a day (TID) | INTRAVENOUS | Status: DC
  Administered 2022-09-09 – 2022-09-10 (×2): 2.25 g via INTRAVENOUS
  Filled 2022-09-09 (×3): qty 50

## 2022-09-09 MED ORDER — DEXAMETHASONE SODIUM PHOSPHATE 10 MG/ML IJ SOLN
INTRAMUSCULAR | Status: DC | PRN
  Administered 2022-09-09: 10 mg via INTRAVENOUS

## 2022-09-09 MED ORDER — NALOXONE HCL 0.4 MG/ML IJ SOLN: 0.4000 mg | INTRAMUSCULAR | Status: DC | PRN

## 2022-09-09 MED ORDER — LIDOCAINE 2% (20 MG/ML) 5 ML SYRINGE
INTRAMUSCULAR | Status: DC | PRN
  Administered 2022-09-09: 40 mg via INTRAVENOUS

## 2022-09-09 MED ORDER — FENTANYL CITRATE (PF) 100 MCG/2ML IJ SOLN
25.0000 ug | INTRAMUSCULAR | Status: DC | PRN
  Administered 2022-09-09 (×3): 50 ug via INTRAVENOUS

## 2022-09-09 MED ORDER — IOHEXOL 350 MG/ML SOLN
100.0000 mL | Freq: Once | INTRAVENOUS | Status: AC | PRN
  Administered 2022-09-09: 100 mL via INTRAVENOUS

## 2022-09-09 MED ORDER — PROPOFOL 10 MG/ML IV BOLUS
INTRAVENOUS | Status: DC | PRN
  Administered 2022-09-09: 60 mg via INTRAVENOUS

## 2022-09-09 MED ORDER — DIPHENHYDRAMINE HCL 50 MG/ML IJ SOLN: 12.5000 mg | Freq: Four times a day (QID) | INTRAMUSCULAR | Status: DC | PRN

## 2022-09-09 MED ORDER — SODIUM CHLORIDE 0.9% IV SOLUTION: Freq: Once | INTRAVENOUS | Status: DC

## 2022-09-09 MED ORDER — ROCURONIUM BROMIDE 10 MG/ML (PF) SYRINGE
PREFILLED_SYRINGE | INTRAVENOUS | Status: DC | PRN
  Administered 2022-09-09: 20 mg via INTRAVENOUS
  Administered 2022-09-09: 10 mg via INTRAVENOUS
  Administered 2022-09-09: 20 mg via INTRAVENOUS
  Administered 2022-09-09: 30 mg via INTRAVENOUS
  Administered 2022-09-09: 20 mg via INTRAVENOUS
  Administered 2022-09-09: 40 mg via INTRAVENOUS

## 2022-09-09 MED ORDER — CEFAZOLIN SODIUM-DEXTROSE 2-3 GM-%(50ML) IV SOLR
INTRAVENOUS | Status: DC | PRN
  Administered 2022-09-09: 2 g via INTRAVENOUS

## 2022-09-09 MED ORDER — IODIXANOL 320 MG/ML IV SOLN
INTRAVENOUS | Status: DC | PRN
  Administered 2022-09-09: 15 mL via INTRA_ARTERIAL

## 2022-09-09 MED ORDER — HEPARIN SODIUM (PORCINE) 5000 UNIT/ML IJ SOLN
5000.0000 [IU] | Freq: Three times a day (TID) | INTRAMUSCULAR | Status: DC
  Administered 2022-09-10 – 2022-09-11 (×5): 5000 [IU] via SUBCUTANEOUS
  Filled 2022-09-09 (×5): qty 1

## 2022-09-09 MED ORDER — FENTANYL CITRATE (PF) 250 MCG/5ML IJ SOLN
INTRAMUSCULAR | Status: AC
  Filled 2022-09-09: qty 5

## 2022-09-09 MED ORDER — SUGAMMADEX SODIUM 200 MG/2ML IV SOLN
INTRAVENOUS | Status: DC | PRN
  Administered 2022-09-09: 150 mg via INTRAVENOUS

## 2022-09-09 MED ORDER — ASPIRIN 81 MG PO TBEC
81.0000 mg | DELAYED_RELEASE_TABLET | Freq: Every day | ORAL | Status: DC
  Administered 2022-09-10 – 2022-09-16 (×5): 81 mg via ORAL
  Filled 2022-09-09 (×7): qty 1

## 2022-09-09 MED ORDER — ACETAMINOPHEN 10 MG/ML IV SOLN
INTRAVENOUS | Status: AC
  Filled 2022-09-09: qty 100

## 2022-09-09 MED ORDER — HEPARIN 6000 UNIT IRRIGATION SOLUTION
Status: DC | PRN
  Administered 2022-09-09: 1

## 2022-09-09 MED ORDER — DIPHENHYDRAMINE HCL 12.5 MG/5ML PO ELIX: 12.5000 mg | ORAL_SOLUTION | Freq: Four times a day (QID) | ORAL | Status: DC | PRN

## 2022-09-09 SURGICAL SUPPLY — 85 items
APL SKNCLS STERI-STRIP NONHPOA (GAUZE/BANDAGES/DRESSINGS) ×4
BAG BANDED W/RUBBER/TAPE 36X54 (MISCELLANEOUS) IMPLANT
BAG COUNTER SPONGE SURGICOUNT (BAG) ×5 IMPLANT
BAG EQP BAND 135X91 W/RBR TAPE (MISCELLANEOUS) ×4
BAG ISL DRAPE 18X18 STRL (DRAPES) ×4
BAG ISOLATION DRAPE 18X18 (DRAPES) IMPLANT
BAG SNAP BAND KOVER 36X36 (MISCELLANEOUS) IMPLANT
BAG SPNG CNTER NS LX DISP (BAG) ×4
BALLN MUSTANG 4.0X20 75 (BALLOONS) ×8
BALLN MUSTANG 8.0X40 75 (BALLOONS) ×4
BALLOON MUSTANG 4.0X20 75 (BALLOONS) IMPLANT
BALLOON MUSTANG 8.0X40 75 (BALLOONS) IMPLANT
BENZOIN TINCTURE PRP APPL 2/3 (GAUZE/BANDAGES/DRESSINGS) IMPLANT
CANISTER SUCT 3000ML PPV (MISCELLANEOUS) ×5 IMPLANT
CANISTER WOUNDNEG PRESSURE 500 (CANNISTER) IMPLANT
CATH CROSS OVER TEMPO 5F (CATHETERS) IMPLANT
CATH EMB 3FR 80 (CATHETERS) IMPLANT
CATH EMB 4FR 80 (CATHETERS) IMPLANT
CATH OMNI FLUSH 5F 65CM (CATHETERS) IMPLANT
CATH QUICKCROSS SUPP .035X90CM (MICROCATHETER) IMPLANT
CLIP TI MEDIUM 24 (CLIP) ×5 IMPLANT
CLIP TI WIDE RED SMALL 24 (CLIP) ×5 IMPLANT
CLOSURE PERCLOSE PROSTYLE (VASCULAR PRODUCTS) IMPLANT
CLSR STERI-STRIP ANTIMIC 1/2X4 (GAUZE/BANDAGES/DRESSINGS) IMPLANT
CNTNR URN SCR LID CUP LEK RST (MISCELLANEOUS) IMPLANT
CONT SPEC 4OZ STRL OR WHT (MISCELLANEOUS) ×12
COVER DOME SNAP 22 D (MISCELLANEOUS) IMPLANT
COVER PROBE W GEL 5X96 (DRAPES) IMPLANT
DEVICE TORQUE H2O (MISCELLANEOUS) IMPLANT
DRAIN CHANNEL 19F RND (DRAIN) IMPLANT
DRAPE HALF SHEET 40X57 (DRAPES) IMPLANT
DRSG ADAPTIC 3X8 NADH LF (GAUZE/BANDAGES/DRESSINGS) IMPLANT
DRSG COVADERM 4X8 (GAUZE/BANDAGES/DRESSINGS) IMPLANT
DRSG VAC GRANUFOAM LG (GAUZE/BANDAGES/DRESSINGS) IMPLANT
DRSG VERSA FOAM LRG 10X15 (GAUZE/BANDAGES/DRESSINGS) IMPLANT
ELECT REM PT RETURN 9FT ADLT (ELECTROSURGICAL) ×4
ELECTRODE REM PT RTRN 9FT ADLT (ELECTROSURGICAL) ×5 IMPLANT
EVACUATOR SILICONE 100CC (DRAIN) IMPLANT
GLIDEWIRE ADV .035X180CM (WIRE) IMPLANT
GLOVE BIO SURGEON STRL SZ7.5 (GLOVE) ×5 IMPLANT
GLOVE BIOGEL PI IND STRL 8 (GLOVE) ×5 IMPLANT
GOWN STRL REUS W/ TWL LRG LVL3 (GOWN DISPOSABLE) ×10 IMPLANT
GOWN STRL REUS W/ TWL XL LVL3 (GOWN DISPOSABLE) ×10 IMPLANT
GOWN STRL REUS W/TWL LRG LVL3 (GOWN DISPOSABLE) ×8
GOWN STRL REUS W/TWL XL LVL3 (GOWN DISPOSABLE) ×8
GUIDEWIRE ANGLED .035X150CM (WIRE) IMPLANT
INSERT FOGARTY SM (MISCELLANEOUS) IMPLANT
KIT BASIN OR (CUSTOM PROCEDURE TRAY) ×5 IMPLANT
KIT ENCORE 26 ADVANTAGE (KITS) IMPLANT
KIT TURNOVER KIT B (KITS) ×5 IMPLANT
NS IRRIG 1000ML POUR BTL (IV SOLUTION) ×10 IMPLANT
PACK PERIPHERAL VASCULAR (CUSTOM PROCEDURE TRAY) ×5 IMPLANT
SET WALTER ACTIVATION W/DRAPE (SET/KITS/TRAYS/PACK) IMPLANT
SHEATH GUIDING 7F 55X73X9MM TD (SHEATH) IMPLANT
SHEATH PINNACLE 6F 10CM (SHEATH) IMPLANT
SHEATH PINNACLE 7F 10CM (SHEATH) IMPLANT
SPONGE INTESTINAL PEANUT (DISPOSABLE) IMPLANT
SPONGE T-LAP 18X18 ~~LOC~~+RFID (SPONGE) IMPLANT
STAPLER VISISTAT 35W (STAPLE) IMPLANT
STENT VIABAHN 6X19 6FR 135 (Permanent Stent) IMPLANT
STOPCOCK 4 WAY LG BORE MALE ST (IV SETS) IMPLANT
STOPCOCK MORSE 400PSI 3WAY (MISCELLANEOUS) IMPLANT
SUT ETHILON 2 0 PSLX (SUTURE) IMPLANT
SUT MNCRL AB 4-0 PS2 18 (SUTURE) IMPLANT
SUT PDS AB 1 TP1 54 (SUTURE) IMPLANT
SUT PROLENE 5 0 C 1 24 (SUTURE) IMPLANT
SUT PROLENE 5 0 C 1 36 (SUTURE) IMPLANT
SUT PROLENE 6 0 BV (SUTURE) IMPLANT
SUT SILK  1 MH (SUTURE) ×4
SUT SILK 1 MH (SUTURE) IMPLANT
SUT VIC AB 2-0 CT1 27 (SUTURE) ×24
SUT VIC AB 2-0 CT1 TAPERPNT 27 (SUTURE) IMPLANT
SUT VIC AB 3-0 SH 27 (SUTURE) ×12
SUT VIC AB 3-0 SH 27X BRD (SUTURE) IMPLANT
SWAB COLLECTION DEVICE MRSA (MISCELLANEOUS) IMPLANT
SWAB CULTURE ESWAB REG 1ML (MISCELLANEOUS) IMPLANT
SYR 10ML LL (SYRINGE) IMPLANT
SYR 20CC LL (SYRINGE) IMPLANT
SYR 3ML LL SCALE MARK (SYRINGE) IMPLANT
TOWEL GREEN STERILE (TOWEL DISPOSABLE) ×5 IMPLANT
TUBING INJECTOR 48 (MISCELLANEOUS) IMPLANT
UNDERPAD 30X36 HEAVY ABSORB (UNDERPADS AND DIAPERS) ×5 IMPLANT
WATER STERILE IRR 1000ML POUR (IV SOLUTION) ×5 IMPLANT
WIRE BENTSON .035X145CM (WIRE) IMPLANT
WIRE MICRO SET SILHO 5FR 7 (SHEATH) IMPLANT

## 2022-09-09 NOTE — Anesthesia Procedure Notes (Signed)
Procedure Name: Intubation Date/Time: 09/09/2022 12:32 PM  Performed by: Carolan Clines, CRNAPre-anesthesia Checklist: Patient identified, Emergency Drugs available, Suction available and Patient being monitored Patient Re-evaluated:Patient Re-evaluated prior to induction Oxygen Delivery Method: Circle System Utilized Preoxygenation: Pre-oxygenation with 100% oxygen Induction Type: IV induction Ventilation: Mask ventilation without difficulty and Oral airway inserted - appropriate to patient size Laryngoscope Size: Mac and 4 Grade View: Grade I Tube type: Oral Tube size: 7.5 mm Number of attempts: 1 Airway Equipment and Method: Stylet and Oral airway Placement Confirmation: ETT inserted through vocal cords under direct vision, positive ETCO2 and breath sounds checked- equal and bilateral Secured at: 22 cm Tube secured with: Tape Dental Injury: Teeth and Oropharynx as per pre-operative assessment

## 2022-09-09 NOTE — Progress Notes (Signed)
   09/09/22 1033  Vitals  Temp 97.7 F (36.5 C)  Temp Source Oral  BP 127/73  MAP (mmHg) 90  BP Location Left Arm  BP Method Automatic  Patient Position (if appropriate) Lying  Pulse Rate 72  Pulse Rate Source Monitor  ECG Heart Rate 72  Resp 17  Oxygen Therapy  SpO2 98 %  O2 Device Room Air  During Treatment Monitoring  Intra-Hemodialysis Comments Tx completed (Received a call from surgeon to end tx as pt is going to OR.)  Post Treatment  Dialyzer Clearance Lightly streaked  Duration of HD Treatment -hour(s) 1 hour(s)  Liters Processed 19.5  Fluid Removed (mL) 300 mL  Tolerated HD Treatment Yes  Hemodialysis Catheter Right Internal jugular Double lumen Permanent (Tunneled)  Placement Date/Time: 09/07/22 1352   Placed prior to admission: No  Serial / Lot #: [id number]  Expiration Date: 03/10/27  Time Out: Correct patient;Correct site;Correct procedure  Maximum sterile barrier precautions: Hand hygiene;Cap;Mask;Large steri...  Site Condition No complications  Blue Lumen Status Dead end cap in place;Heparin locked  Red Lumen Status Dead end cap in place;Heparin locked  Purple Lumen Status N/A  Catheter fill solution Heparin 1000 units/ml  Catheter fill volume (Arterial) 1.6 cc  Catheter fill volume (Venous) 1.6  Dressing Type Transparent  Dressing Status Antimicrobial disc in place;Clean, Dry, Intact  Interventions New dressing  Drainage Description None  Dressing Change Due 09/16/22  Post treatment catheter status Capped and Clamped

## 2022-09-09 NOTE — Anesthesia Procedure Notes (Signed)
Arterial Line Insertion Start/End4/08/2022 12:11 PM, 09/09/2022 12:19 PM Performed by: Oleta Mouse, MD  Patient location: Pre-op. Preanesthetic checklist: patient identified, IV checked, risks and benefits discussed, surgical consent, monitors and equipment checked, pre-op evaluation, timeout performed and anesthesia consent Lidocaine 1% used for infiltration Left, radial was placed Catheter size: 20 G Hand hygiene performed  and maximum sterile barriers used   Attempts: 1 Procedure performed without using ultrasound guided technique. Following insertion, dressing applied and Biopatch. Post procedure assessment: normal and unchanged  Patient tolerated the procedure well with no immediate complications.

## 2022-09-09 NOTE — Progress Notes (Signed)
PROGRESS NOTE Albert Harris  [id number] DOB: [birth year] DOA: 08/28/2022 PCP: Kerin Perna, NP  Brief Narrative/Hospital Course: 67 year old male with history of ESRD on HD TTS, PVD,COPD, essential hypertension, chronic systolic congestive heart failure with ejection fraction 35 to 40%, presented with abdominal pain nausea and diaphoresis.Patient was also hypotensive with a lactic acidosis and was admitted to [place] a pulseless left lower extremity was found to have femoral pseudoaneurysm.Culture came back with MRSA bacteremia.  S/p OR with Dr. Virl Cagey for left groin exploration, hematoma evacuation on 3/23. OR findings showed dehiscence at proximal aspect of bovine pericardial patch at the suture line.  Patient had required pressor for septic shock.  Due to severe hyperkalemia, patient was had a emergent CRRT. Patient currently on vancomycin, followed by ID, planning 8 weeks IV antibiotics followed with suppressive treatment.  TEE did not show any vegetation. Transferred to [facility] service 09/08/22   Subjective: Seen this am C/o pain on left leg groin more since being cleaned this am States he is unable to move much on left leg, able to move some- cannotn lift up the leg, feels some sensation and states it has been like this for few days. On RA Patient otherwise denies any nausea, vomiting, chest pain, shortness of breath, fever, chills, headache, focal weakness, numbness tingling, speech difficulties  Just seen by vascular surgery in taking him to the OR, CT run off also ordered. Labs showing persistent leukocytosis potassium 4.2  Assessment and Plan:  Septic shock with MRSA bacteremia Left Groin wound infection Right groin hematoma with a left common femoral artery pseudoaneurysm: Initially admitted to ICU, shock resolved ID following for MRSA bacteremia and will need 8 weeks of IV vancomycin followed by suppressive treatment.  Vascular surgery following closely and taking him to the  OR overnight had some bleeding issues and complaints of pain on the groin, CT scan has been ordered.  Continue Crestor CTA 4/3 came back later w/ Thrombosis of LEFT CFA pseudoaneurysm with new 3.5 cm filling defect, consistent with arterial thrombus, extending from the PSA neck and into the distal CFA. This is at risk for distal embolization and potential acute LEFT lower extremity ischemia.New 8 mm distal LEFT EIA pseudoaneurysm, medial to the stent distal landing zone. Mild interval enlargement of retroperitoneal hematoma, in a craniocaudal dimension measuring 12.2 cm (previously 11.2 cm). No CTA evidence of active extravasation. Patent RIGHT femoral-to-popliteal graft and bilateral iliac stents.Severe burden of splanchnic arterial atherosclerosis, at risk for chronic mesenteric ischemia. VVS Planning for OR 4/3 along with 2 u PRBC transfusion.  PVD with history of left external iliac artery and common artery stenting and right femoropopliteal bypass with left common femoral artery pseudoaneurysm on CT 3/22, status post left femoral artery pseudoaneurysm repair 3/22, S/P AVG thrombectomy attempted but patient became hemodynamically unstable and procedure was aborted, followed by vascular .  Per vascular patient is at high risk for leg amputation and patient is aware  Retroperitoneal hemorrhage on CTA 3/22, on 3/30 CT abdomen intrapelvic asked retroperitoneal hematoma within the prevesical space, right greater than left extension of hemorrhage within the extraperitoneal and lateral pelvic walls-managed vascular surgery ordered CT abdomen today   Acute respiratory failure with hypoxemia.   COPD Currently on room air, no wheezing.  Chronic CHF with preserved EF-based on echo from 4/1 Pulmonary hypertension: Volume being addressed with dialysis.  TEE 4/1 showed EF 50-55%, severe LVH, no left atrial thrombus detected  ESRD on HD TTS followed by nephrology closely, right chest with  TDC present-AVG  clotted on 3/27 and temp catheter removed 3/28 IR placed new TDC on 4/1.  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  Awaiting neurology recommendation for anticoagulation/antiplatelets, continue Crestor  HLD: Continue Crestor History of hypertension with transient hypotension on 3/30 was managed with IV fluid bolus, currently not needing antihypertensives.  Poor oral intake: Dietitian following if intake does not pick up post op, plan for core track placement on Friday Moderate protein calorie malnutrition augment diet as tolerated Nutrition Problem: Moderate Malnutrition (in the context of chronic illness (ESRD)) Etiology: poor appetite Signs/Symptoms: moderate fat depletion, severe muscle depletion Interventions: Refer to RD note for recommendations, Liberalize Diet, Nepro shake, MVI   Pressure injury of skin on coccyx stage II POA, wound care   DVT prophylaxis: heparin injection 5,000 Units Start: 09/06/22 2000 Code Status:   Code Status: Full Code Family Communication: plan of care discussed with patient at bedside. Patient status is: Patient because of ongoing need for IV antibiotics and bleeding and pain Level of care: Telemetry Medical   Dispo: The patient is from: home lives with wife            Anticipated disposition: CIR Objective: Vitals last 24 hrs: Vitals:   09/08/22 1608 09/08/22 1946 09/09/22 0500  09/09/22 0806  BP: (!) 141/68 (!) 150/82  (!) 180/78  Pulse: 70 76  65  Resp: 18 15  18   Temp: 97.8 F (36.6 C) 97.8 F (36.6 C)  (!) 97.5 F (36.4 C)  TempSrc:  Oral  Oral  SpO2: 96% 92%  100%  Weight:   62.4 kg   Height:       Weight change: 18.5 kg  Physical Examination: General exam: alert awake, older than stated age HEENT:Oral mucosa moist, Ear/Nose WNL grossly Respiratory system: bilaterally clear BS, no use of access3ory muscle Cardiovascular system: S1 & S2 +, No JVD. Gastrointestinal system: Abdomen soft,NT,ND, BS+ Nervous System:Alert, awake, moving extremities. Extremities: LE edema neg, cooler left leg\ Skin: No rashes,no icterus. MSK: Normal muscle bulk,tone, power Rt chest with TDC+  Bruise on abdomen, groin  area on left with dressing intact Medications reviewed:  Scheduled Meds:  (feeding supplement) PROSource Plus  30 mL Oral BID BM   Chlorhexidine Gluconate Cloth  6 each Topical Q0600   Chlorhexidine Gluconate Cloth  6 each Topical Q0600   darbepoetin (ARANESP) injection - DIALYSIS  100 mcg Subcutaneous Q Wed-1800   feeding supplement (NEPRO CARB STEADY)  237 mL Oral BID BM   heparin injection (subcutaneous)  5,000 Units Subcutaneous Q8H   multivitamin  1 tablet Oral QHS   pantoprazole  40 mg Oral Daily   rosuvastatin  10 mg Oral Daily   sodium chloride flush  10-40 mL Intracatheter Q12H   tetracaine  100 mg Infiltration Once   Continuous Infusions:  sodium chloride 10 mL/hr at 09/08/22 0903   sodium chloride Stopped (09/01/22 0400)   [START ON 09/10/2022] vancomycin  Diet Order             Diet NPO time specified  Diet effective now                  No intake or output data in the 24 hours ending 09/09/22 0820  Net IO Since Admission: 3,338.52 mL [09/09/22 0820]  Wt Readings from Last 3 Encounters:  09/09/22 62.4 kg  08/23/22 64.1 kg  08/15/22 64.9 kg     Unresulted Labs (From admission, onward)     Start     Ordered    09/09/22 0720  CBC  ONCE - STAT,   STAT       Question:  Specimen collection method  Answer:  Lab=Lab collect   09/09/22 0719   08/29/22 0500  Renal function panel (daily at 0500)  Daily,   R      08/28/22 2020   Signed and Held  Prepare RBC (crossmatch)  (Adult Blood Administration - PRBC)  Once,   R       Question Answer Comment  # of Units 4 units   Transfusion Indications Hemoglobin 8 gm/dL or less and orthopedic or cardiac surgery or pre-existing cardiac condition   Number of Units to Keep Ahead 2 units ahead   Keep ahead indications Surgery   Instructions: Transfuse   If emergent release call blood bank Not emergent release      Signed and Held          Data Reviewed: I have personally reviewed following labs and imaging studies CBC: Recent Labs  Lab 09/06/22 0608 09/06/22 1435 09/07/22 0233 09/07/22 1535 09/08/22 0329  WBC 19.8* 24.3* 24.4* 29.8* 29.0*  HGB 9.2* 8.6* 8.5* 9.2* 10.0*  HCT 27.7* 26.6* 26.5* 27.7* 29.9*  MCV 92.3 94.7 95.7 93.0 93.1  PLT 189 202 236 234 [id number]   Basic Metabolic Panel: Recent Labs  Lab 09/04/22 0838 09/05/22 0053 09/06/22 0608 09/07/22 0434 09/08/22 0329 09/09/22 0411  NA 134* 131* 133* 129* 134* 136  K 3.7 3.8 4.3 4.5 4.0 4.2  CL 94* 95* 96* 90* 95* 98  CO2 24 21* 19* 19* 23 23  GLUCOSE 90 100* 92 98 235* 120*  BUN 48* 61* 81* 100* 43* 80*  CREATININE 4.21* 5.41* 7.28* 8.70* 4.77* 6.51*  CALCIUM 8.7* 8.3* 8.5* 8.3* 8.3* 8.3*  MG 2.3  --   --  2.4  --   --   PHOS 4.2 4.7* 6.3* 7.8* 4.4 6.3*   GFR: Estimated Creatinine Clearance: 9.6 mL/min (A) (by C-G formula based on SCr of 6.51 mg/dL (H)). Liver Function Tests: Recent Labs  Lab 09/05/22 0053 09/06/22 0608 09/07/22 0434 09/08/22 0329 09/09/22 0411  ALBUMIN 1.9* 2.0* 1.7* 2.1* 1.9*  CBG: Recent Labs  Lab 09/09/22 0808  GLUCAP 114*   Recent Labs  Lab 09/02/22 1659  LATICACIDVEN 1.0    Recent Results (from the past 240 hour(s))  Culture, blood (Routine X 2)  w Reflex to ID Panel     Status: None   Collection Time: 08/31/22  9:06 AM   Specimen: BLOOD LEFT FOREARM  Result Value Ref Range Status   Specimen Description BLOOD LEFT FOREARM  Final   Special Requests   Final    BOTTLES DRAWN AEROBIC AND ANAEROBIC Blood Culture adequate volume   Culture   Final    NO GROWTH 5 DAYS Performed at [facility], [place] [address], [place], [place] [address]    Report Status 09/05/2022  FINAL  Final  Culture, blood (Routine X 2) w Reflex to ID Panel     Status: Abnormal   Collection Time: 08/31/22  9:07 AM   Specimen: BLOOD LEFT ARM  Result Value Ref Range Status   Specimen Description BLOOD LEFT ARM  Final   Special Requests   Final    BOTTLES DRAWN AEROBIC AND ANAEROBIC Blood Culture results may not be optimal due to an inadequate volume of blood received in culture bottles   Culture  Setup Time   Final    GRAM POSITIVE COCCI IN CLUSTERS AEROBIC BOTTLE ONLY CRITICAL VALUE NOTED.  VALUE IS CONSISTENT WITH PREVIOUSLY REPORTED AND CALLED VALUE.    Culture (A)  Final    STAPHYLOCOCCUS AUREUS SUSCEPTIBILITIES PERFORMED ON PREVIOUS CULTURE WITHIN THE LAST 5 DAYS. Performed at [facility], [place] [address], [place], [place] [address]    Report Status 09/03/2022 FINAL  Final  Aerobic/Anaerobic Culture w Gram Stain (surgical/deep wound)     Status: None   Collection Time: 09/02/22  2:06 PM   Specimen: Wound  Result Value Ref Range Status   Specimen Description WOUND  Final   Special Requests AV GRAFT THROMBUS  Final   Gram Stain NO WBC SEEN NO ORGANISMS SEEN   Final   Culture   Final    No growth aerobically or anaerobically. Performed at [facility], [place] [address] [place], [place] [address]    Report Status 09/08/2022 FINAL  Final  Culture, blood (Routine X 2) w Reflex to ID Panel     Status: None   Collection Time: 09/02/22  4:44 PM   Specimen: BLOOD LEFT HAND  Result Value Ref Range Status   Specimen Description BLOOD LEFT  HAND  Final   Special Requests   Final    BOTTLES DRAWN AEROBIC AND ANAEROBIC Blood Culture results may not be optimal due to an inadequate volume of blood received in culture bottles   Culture   Final    NO GROWTH 5 DAYS Performed at [facility], [place] [address] [place], [place] [address]    Report Status 09/07/2022 FINAL  Final  Culture, blood (Routine X 2) w Reflex to ID Panel     Status: None   Collection Time: 09/02/22  4:45 PM   Specimen: BLOOD LEFT HAND  Result Value Ref Range Status   Specimen Description BLOOD LEFT HAND  Final   Special Requests   Final    BOTTLES DRAWN AEROBIC AND ANAEROBIC Blood Culture adequate volume   Culture   Final    NO GROWTH 5 DAYS Performed at [facility], [place] [address] [place], [place] [address]    Report Status 09/07/2022 FINAL  Final    Antimicrobials: Anti-infectives (From admission, onward)    Start     Dose/Rate Route Frequency Ordered Stop   09/10/22 1200  vancomycin (VANCOREADY) IVPB 500 mg/100 mL        500 mg 100 mL/hr over 60 Minutes Intravenous Every T-Th-Sa (Hemodialysis) 09/09/22 0719     09/08/22 0830  vancomycin (VANCOREADY) IVPB 750 mg/150 mL        750 mg 150 mL/hr over 60 Minutes Intravenous  Once 09/08/22 0743 09/08/22 1004   09/07/22 1200  ceFAZolin (ANCEF) IVPB 2g/100 mL premix       Note to Pharmacy: Give in IR for surgical prophylaxis   2 g 200 mL/hr over 30 Minutes Intravenous  Once 09/07/22 1100 09/07/22 1327  09/07/22 1101  ceFAZolin (ANCEF) 2-4 GM/100ML-% IVPB       Note to Pharmacy: Roosvelt Maser N: cabinet override      09/07/22 1101 09/07/22 1335   09/07/22 0734  vancomycin variable dose per unstable renal function (pharmacist dosing)  Status:  Discontinued         Does not apply See admin instructions 09/07/22 0734 09/09/22 0718   09/05/22 1600  vancomycin (VANCOREADY) IVPB 750 mg/150 mL        750 mg 150 mL/hr over 60 Minutes Intravenous  Once 09/05/22 1505 09/05/22 1717   09/02/22 1227   ceFAZolin (ANCEF) 2-4 GM/100ML-% IVPB       Note to Pharmacy: Dallie Piles, [place]: cabinet override      09/02/22 1227 09/03/22 0044   09/02/22 0520  vancomycin variable dose per unstable renal function (pharmacist dosing)  Status:  Discontinued         Does not apply See admin instructions 09/02/22 0521 09/05/22 0940   08/31/22 1800  vancomycin (VANCOREADY) IVPB 750 mg/150 mL  Status:  Discontinued        750 mg 150 mL/hr over 60 Minutes Intravenous Every 24 hours 08/31/22 0822 09/02/22 0521   08/30/22 1800  vancomycin (VANCOREADY) IVPB 1500 mg/300 mL        1,500 mg 150 mL/hr over 120 Minutes Intravenous STAT 08/30/22 1739 08/30/22 2139      Culture/Microbiology    Component Value Date/Time   SDES BLOOD LEFT HAND 09/02/2022 1645   SPECREQUEST  09/02/2022 1645    BOTTLES DRAWN AEROBIC AND ANAEROBIC Blood Culture adequate volume   CULT  09/02/2022 1645    NO GROWTH 5 DAYS Performed at [facility], [place] [address] [place],  [address]    REPTSTATUS 09/07/2022 FINAL 09/02/2022 1645  Other culture-see note  Radiology Studies: DG C-Arm 1-60 Min  Result Date: 09/07/2022 CLINICAL DATA:  Surgery.  Dialysis catheter EXAM: DG C-ARM 1-60 MIN CONTRAST:  None FLUOROSCOPY: Fluoroscopy Time:  29 seconds Radiation Exposure Index (if provided by the fluoroscopic device): 4.24 mGy Number of Acquired Spot Images: 1 COMPARISON:  None Available. FINDINGS: Catheter in place with indwelling wire. Imaging was obtained to aid in treatment IMPRESSION: Intraoperative fluoroscopy Electronically Signed   By: Jill Side M.D.   On: 09/07/2022 15:53   EP STUDY  Result Date: 09/07/2022 See surgical note for result.  ECHO TEE  Result Date: 09/07/2022    TRANSESOPHOGEAL ECHO REPORT   Patient Name:   RODOLPH PATILLO Date of Exam: 09/07/2022 Medical Rec #:  [id number]      Height:       68.0 in Accession #:    [id number]     Weight:       96.8 lb Date of Birth:  January 29, 1956     BSA:          1.501 m Patient  Age:    25 years       BP:           148/70 mmHg Patient Gender: M              HR:           75 bpm. Exam Location:  Inpatient Procedure: Transesophageal Echo Indications:    Bacteremia  History:        Patient has prior history of Echocardiogram examinations.                 Signs/Symptoms:Bacteremia.  Sonographer:  [facility] Referring Phys: [place]: The transesophogeal probe was passed without difficulty through the esophogus of the patient. Sedation performed by different physician. The patient developed no complications during the procedure.  IMPRESSIONS  1. No vegetations or evidence of SBE.  2. Left ventricular ejection fraction, by estimation, is 50 to 55%. The left ventricle has low normal function. There is severe left ventricular hypertrophy.  3. Right ventricular systolic function is normal. The right ventricular size is normal.  4. Left atrial size was moderately dilated. No left atrial/left atrial appendage thrombus was detected.  5. Right atrial size was moderately dilated.  6. The mitral valve is abnormal. Mild mitral valve regurgitation.  7. The aortic valve is tricuspid. There is moderate calcification of the aortic valve. There is moderate thickening of the aortic valve. Aortic valve regurgitation is not visualized. Mild aortic valve stenosis. FINDINGS  Left Ventricle: Left ventricular ejection fraction, by estimation, is 50 to 55%. The left ventricle has low normal function. The left ventricular internal cavity size was normal in size. There is severe left ventricular hypertrophy. Right Ventricle: The right ventricular size is normal. Right vetricular wall thickness was not assessed. Right ventricular systolic function is normal. Left Atrium: Left atrial size was moderately dilated. No left atrial/left atrial appendage thrombus was detected. Right Atrium: Right atrial size was moderately dilated. Pericardium: There is no evidence of pericardial effusion. Mitral Valve: The mitral  valve is abnormal. There is mild thickening of the mitral valve leaflet(s). Mild mitral annular calcification. Mild mitral valve regurgitation. Tricuspid Valve: The tricuspid valve is normal in structure. Tricuspid valve regurgitation is mild. Aortic Valve: The aortic valve is tricuspid. There is moderate calcification of the aortic valve. There is moderate thickening of the aortic valve. Aortic valve regurgitation is not visualized. Mild aortic stenosis is present. Aortic valve mean gradient measures 9.0 mmHg. Aortic valve peak gradient measures 17.1 mmHg. Aortic valve area, by VTI measures 1.29 cm. Pulmonic Valve: The pulmonic valve was normal in structure. Pulmonic valve regurgitation is trivial. Aorta: The aortic root is normal in size and structure. IAS/Shunts: No atrial level shunt detected by color flow Doppler. Additional Comments: No vegetations or evidence of SBE. LEFT VENTRICLE PLAX 2D LVOT diam:     1.70 cm LV SV:         48 LV SV Index:   32 LVOT Area:     2.27 cm  AORTIC VALVE AV Area (Vmax):    1.45 cm AV Area (Vmean):   1.32 cm AV Area (VTI):     1.29 cm AV Vmax:           207.00 cm/s AV Vmean:          139.000 cm/s AV VTI:            0.376 m AV Peak Grad:      17.1 mmHg AV Mean Grad:      9.0 mmHg LVOT Vmax:         132.00 cm/s LVOT Vmean:        81.000 cm/s LVOT VTI:          0.213 m LVOT/AV VTI ratio: 0.57  SHUNTS Systemic VTI:  0.21 m Systemic Diam: 1.70 cm Jenkins Rouge MD Electronically signed by Jenkins Rouge MD Signature Date/Time: 09/07/2022/12:28:06 PM    Final      LOS: 12 days   Antonieta Pert, MD [facility]  09/09/2022, 8:20 AM

## 2022-09-09 NOTE — Op Note (Signed)
DATE OF SERVICE: 09/09/2022  PATIENT:  Albert Harris  67 y.o. 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  POST-OPERATIVE DIAGNOSIS:  Same  PROCEDURE:   1) ultrasound guided right common femoral artery access 2) left common iliac to profunda femoris bypass with ipsilateral non-reversed greater saphenous vein in anatomic tunnel 3) left common iliac artery angioplasty and stenting (6x1mm VBX post-dilated proximally to 37mm) 4) left greater saphenous vein harvest 5) left sartorious muscle flap to groin 6) left lower extremity angiography with first order cannulation  SURGEON:  Surgeon(s) and Role:    * Cherre Robins, MD - Primary  ASSISTANT: Ahmed Prima, MD  Vicente Serene, MD  An experienced assistant was required given the complexity of this procedure and the standard of surgical care. My assistant helped with exposure through counter tension, suctioning, ligation and retraction to better visualize the surgical field.  My assistant expedited sewing during the case by following my sutures. Wherever I use the term "we" in the report, my assistant actively helped me with that portion of the procedure.  ANESTHESIA:   general  EBL: 652mL  BLOOD ADMINISTERED: see anesthesia record 2+ u PRBCs  DRAINS: Penrose drain in the retroperitoneum    LOCAL MEDICATIONS USED:  NONE  SPECIMEN:  none  COUNTS: confirmed correct.  TOURNIQUET:  none  PATIENT DISPOSITION:  PACU - hemodynamically stable.   Delay start of Pharmacological VTE agent (>24hrs) due to surgical blood loss or risk of bleeding: no  INDICATION FOR PROCEDURE: MICHAELL IGLEHEART is a 67 y.o. male with left groin infected pseudoaneurysm after bovine pericardial patch angioplasty. After careful discussion of risks, benefits, and alternatives the patient was offered groin exploration with possible patch excision and sartorius muscle flap. The patient understood and wished to  proceed.  OPERATIVE FINDINGS: infected hematoma about patch angioplasty. Patch disrupted proximally for about 1cm. Above the patch, in the mid external iliac artery there was another disruption in the native artery where a viabahn stent was visible. I extended the exposure into the retroperitoneum and exposed the common iliac arteries. A fogarty hydrogrip clamp was used to control proximally. This crushed the existing VBX stent, but this was the only way to get control of the heavily diseased common iliac artery. The entire common femoral artery and external iliac artery and involved prosthetic material were debrided.  An interposition patch graft was then sewn from the common iliac artery and involved stent to the terminal common femoral artery were Corel patch was fashioned with the profunda femoris arteries.  Good technical result was achieved.  Revision stenting was then performed to reopen the crush stent.  Good angiographic result was noted.  A sartorius flap was created.  The retroperitoneal JP drain was placed.  The wound was closed with a skin left partially open and a "[place]" VAC applied to wick the incision.  DESCRIPTION OF PROCEDURE: After identification of the patient in the pre-operative holding area, the patient was transferred to the operating room. The patient was positioned upon on the operating room table. Anesthesia was induced. The abdomen, left lower extremity was prepped and draped in standard fashion. A surgical pause was performed confirming correct patient, procedure, and operative location.  Ultrasound guidance was used to obtain access in the right common femoral artery.  The access was upsized to 7 [place].  An Omni Flush catheter was used to select the left common iliac artery.  An angiogram was performed.  This redemonstrated the  CT angiogram findings.  An 8 mm Mustang balloon was used to obtain balloon control of the inflow.  The groin incision was then reopened.  All  staples were removed from the groin incision.  All suture material was removed sharply from the groin wound.  Infected appearing hematoma was encountered in the groin.  This was evacuated.  Fairly good hemostasis was achieved from balloon control.  A clamp was applied to the profunda femoris arteries distally.  The patch was debrided.  This did not appear healthy.  Proximally there was a roughly 1 cm defect in the patch with no visible suture material.  A.  Infection had eroded through this repair.  Exposure was carried cranially.  I found an additional defect in the external iliac artery in the mid segment with exposed Viabahn.  Simple patch angioplasty would not be sufficient to repair this and so I elected to perform an iliofemoral bypass.  The groin incision was extended in a "hockey-stick" configuration into the left lower quadrant.  The fascia was incised.  The oblique muscles were split.  The retroperitoneal space was entered at the lateral aspect of the oblique muscles.  The peritoneal envelope was rotated medially.  The iliac vascular bundle was identified and skeletonized.  This appeared inflamed.  The balloon was let down to perform Fogarty embolectomy of the known thrombus in the iliac arteries.  When this was performed, the Viabahn stents came with the thrombus.  I was able to achieve digital control of the arteriotomy fairly easily.  I placed a Fogarty Hydro grip on the common iliac artery and intentionally crushed the balloon expandable stent within to obtain definitive proximal control.  The patient was systemically heparinized.  Activated clotting time measurements were used throughout the case to confirm adequate anticoagulation.  All prosthetic material from the common iliac stent just below the clamp to the terminus of the common femoral artery was debrided.  The native arteries were also debrided.  The left greater saphenous vein was harvested from the saphenofemoral junction to the knee.   The saphenous vein was prepared for use as a bypass conduit by placing silk suture around all branch points.  The bypass graft was configured in a nonreversed fashion.  The proximal aspect of the bypass was sewn first using continuous running suture of 5-0 Prolene to the common iliac artery.  After completion the clamp was released on the common iliac artery.  A trickle of blood flow was noted through the bypass graft.  This was enough to pressurized the valves and perform valve lysis.  This was performed using standard technique.  Any additional bleeding points were then controlled with silk suture.  The vein was cut to size to allow to sit without significant redundancy.  The distal end was spatulated to allow end-to-end anastomosis to the Corel patch of common femoral artery and profunda femoris arteries.  The distal anastomosis was then performed using continuous running suture of 5-0 Prolene.  Prior to completion the anastomosis was flushed and de-aired.  An angiogram was then performed.  This revealed a trickle of contrast through the bypass graft.  There appeared to be no leak or hemostasis.  I was able to advance a Glidewire advantage through the crushed stent with the assistance of a 7 French conformable sheath.  I was then able to create a path through the stent for a angioplasty balloon with a quick cross catheter.  I then performed gentle angioplasty of the crush stent with a  4 x 20 mm Mustang balloon.  Finally a 6 x 29 mm VBX balloon expandable stent was positioned across the anastomosis and deployed with standard technique.  The proximal aspect of this was flared with a 7 mm balloon to allow better apposition to the common iliac artery.  Completion angiogram revealed excellent technical result with brisk flow of contrast through the repaired segment into the bypass and into the 2 profunda femoris arteries.  Heparin was reversed with protamine.  The sartorius muscle was then mobilized from its  attachments on the anterior superior iliac spine.  This was brought over the wound and secured to the medial edge of the subcutaneous tissue to cover the repair.  A 19 French channel JP drain was brought onto the field and tunneled through the retroperitoneum to exit in the anterior thigh.  This was connected to bulb suction.  The right common femoral artery was closed using a Perclose device.  Good hemostasis was achieved.  The wounds were closed in layers using 2-0 PDS for the fascia.  2-0 PDS to secure the sartorius flap to the subcutaneous tissue.  2-0 and 3-0 Vicryl to close the subcutaneous tissue.  "White" sponge was cut into seven small strips and the gaps of the wound to wick the likely infected wound.  Adaptic was placed over these.  A black sponge was placed over the Adaptic.  This was all secured with Ioban dressing.  Good seal was achieved with suction.  The JP was connected to a bulb and had good suction.  The harvest incisions were then closed with 3-0 Vicryl and 4-0 Monocryl.  Upon completion of the case instrument and sharps counts were confirmed correct. The patient was transferred to the PACU in good condition. I was present for all portions of the procedure.  Yevonne Aline. Stanford Breed, MD [facility] Phone Number: [phone] 09/09/2022 5:14 PM

## 2022-09-09 NOTE — Progress Notes (Signed)
Inpatient Rehabilitation Admissions Coordinator   Noted ongoing medical issues. I am following his case from a distance.  Danne Baxter, RN, MSN Rehab Admissions Coordinator [phone] 09/09/2022 9:05 AM

## 2022-09-09 NOTE — Progress Notes (Signed)
PT Cancellation Note  Patient Details Name: Albert Harris MRN: [id number] DOB: 1956/03/06   Cancelled Treatment:    Reason Eval/Treat Not Completed: Patient at procedure or test/unavailable. Pt is in surgery today and will reattempt at another time.   Ramond Dial 09/09/2022, 1:21 PM  Mee Hives, PT PhD Acute Rehab Dept. Number: [place] and [place]

## 2022-09-09 NOTE — Anesthesia Preprocedure Evaluation (Signed)
Anesthesia Evaluation  Patient identified by MRN, date of birth, ID band Patient awake    Reviewed: Allergy & Precautions, NPO status , Patient's Chart, lab work & pertinent test results  Airway Mallampati: II  TM Distance: >3 FB Neck ROM: Full    Dental   Pulmonary former smoker   breath sounds clear to auscultation       Cardiovascular hypertension, + Peripheral Vascular Disease and +CHF  + Valvular Problems/Murmurs AS  Rhythm:Regular Rate:Normal   1. No vegetations or evidence of SBE.   2. Left ventricular ejection fraction, by estimation, is 50 to 55%. The  left ventricle has low normal function. There is severe left ventricular  hypertrophy.   3. Right ventricular systolic function is normal. The right ventricular  size is normal.   4. Left atrial size was moderately dilated. No left atrial/left atrial  appendage thrombus was detected.   5. Right atrial size was moderately dilated.   6. The mitral valve is abnormal. Mild mitral valve regurgitation.   7. The aortic valve is tricuspid. There is moderate calcification of the  aortic valve. There is moderate thickening of the aortic valve. Aortic  valve regurgitation is not visualized. Mild aortic valve stenosis.     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  Lab Results      Component                Value               Date                      K                        4.2                 09/09/2022                Musculoskeletal   Abdominal   Peds  Hematology  (+) Blood dyscrasia, anemia Lab Results      Component                Value               Date                      WBC                      20.9 (H)            09/09/2022                HGB                      8.2 (L)             09/09/2022                HCT                      26.3 (L)            09/09/2022                MCV  97.4                09/09/2022                PLT                      237                 09/09/2022              Anesthesia Other Findings   Reproductive/Obstetrics                             Anesthesia Physical Anesthesia Plan  ASA: 4  Anesthesia Plan: General   Post-op Pain Management: Ofirmev IV (intra-op)*   Induction: Intravenous  PONV Risk Score and Plan: 2 and Ondansetron and Dexamethasone  Airway Management Planned: Oral ETT  Additional Equipment: Arterial line  Intra-op Plan:   Post-operative Plan: Extubation in OR  Informed Consent: I have reviewed the patients History and Physical, chart, labs and discussed the procedure including the risks, benefits and alternatives for the proposed anesthesia with the patient or authorized representative who has indicated his/her understanding and acceptance.     Dental advisory given  Plan Discussed with: CRNA  Anesthesia Plan Comments:        Anesthesia Quick Evaluation

## 2022-09-09 NOTE — Progress Notes (Addendum)
  Progress Note    09/09/2022 6:39 AM 2 Days Post-Op  Subjective:  says he is really sore in his left groin.    afebrile  Vitals:   09/08/22 1608 09/08/22 1946  BP: (!) 141/68 (!) 150/82  Pulse: 70 76  Resp: 18 15  Temp: 97.8 F (36.6 C) 97.8 F (36.6 C)  SpO2: 96% 92%    Physical Exam: General:  resting in bed listening to music Lungs:  non labored Incisions:  right arm incision is clean and dry and healing nicely; left groin incision with staples in tact with erythema and serosanguinous drainage on bandage.  Mild area of swelling left groin.   Extremities:  bilateral feet are warm with right > left; sensory in tact   CBC    Component Value Date/Time   WBC 29.0 (H) 09/08/2022 0329   RBC 3.21 (L) 09/08/2022 0329   HGB 10.0 (L) 09/08/2022 0329   HGB 10.4 (L) 06/18/2021 1431   HCT 29.9 (L) 09/08/2022 0329   HCT 31.6 (L) 06/18/2021 1431   PLT 231 09/08/2022 0329   PLT 142 (L) 06/18/2021 1431   MCV 93.1 09/08/2022 0329   MCV 96 06/18/2021 1431   MCH 31.2 09/08/2022 0329   MCHC 33.4 09/08/2022 0329   RDW 18.7 (H) 09/08/2022 0329   RDW 14.3 06/18/2021 1431   LYMPHSABS 1.5 08/28/2022 1810   LYMPHSABS 1.5 06/18/2021 1431   MONOABS 0.7 08/28/2022 1810   EOSABS 0.1 08/28/2022 1810   EOSABS 1.0 (H) 06/18/2021 1431   BASOSABS 0.1 08/28/2022 1810   BASOSABS 0.1 06/18/2021 1431    BMET    Component Value Date/Time   NA 136 09/09/2022 0411   NA 141 06/18/2021 1431   K 4.2 09/09/2022 0411   CL 98 09/09/2022 0411   CO2 23 09/09/2022 0411   GLUCOSE 120 (H) 09/09/2022 0411   BUN 80 (H) 09/09/2022 0411   BUN 20 06/18/2021 1431   CREATININE 6.51 (H) 09/09/2022 0411   CREATININE 1.15 02/19/2017 1049   CALCIUM 8.3 (L) 09/09/2022 0411   CALCIUM 8.5 (L) 06/10/2020 0900   GFRNONAA 9 (L) 09/09/2022 0411   GFRAA 6 (L) 03/04/2020 1045    INR    Component Value Date/Time   INR 1.2 08/12/2022 1501     Intake/Output Summary (Last 24 hours) at 09/09/2022 [id number] Last data  filed at 09/08/2022 0800 Gross per 24 hour  Intake 240 ml  Output --  Net 240 ml      Assessment/Plan:  67 y.o. male is s/p:  L femoral exploration with pseudoaneurysm repair RUE AVG thrombectomy  TDC placement    -pt left groin with erythema and pain with serosanguinous drainage on bandage.  Small area of swelling but no evidence of active bleeding currently.  Hgb stable yesterday.  No CBC today but will order one for this morning.   CTA still pending.  Pt on abx and will need lifelong suppressive abx at this point.  -some mention of pt going to OR today but looks like he is on schedule for tomorrow.  Please keep npo until Dr. Virl Cagey sees pt to determine timing.  -pt at high risk for needing left AKA but currently not indicated.    Leontine Locket, PA-C [facility] [phone] 09/09/2022 6:39 AM

## 2022-09-09 NOTE — Progress Notes (Signed)
Nutrition Quick Note:   Spoke with MD in regards to potential for Cortrak tube. Pt is currently NPO and in OR. Plan is to wait and see how pt does after procedure. If no improvements, will plan for Cortrak on 4/5.   Thalia Bloodgood, RD, LDN, CNSC.

## 2022-09-09 NOTE — Progress Notes (Addendum)
No hematoma. Drainage that is blood tinged. Appears unchanged from yesterday. LPN not available for further questioning due to shift change. Appreciate morning nursing being attentive regarding his care.  Vascular and primary unaware for these changes overnight.  Pt will be going for stat CTA with runoff.  Discussed continued Tele and monitoring. Discussed holding pressure should actual bleeding occur.  NPO, pt will go to OR today regardless for wound exploration washout muscle flap, possible CFA re-plasty with GSV. Bedrest  After discussing the above, Albert Harris elected to proceed.    Broadus John MD

## 2022-09-09 NOTE — Progress Notes (Signed)
Patient developed a hematoma to his Surgical incision area L. groin area with moderate bleeding. Dressing change done 3 times this shift. Pt was also given pain meds x2 due to excruciating pain. Will continue to monitor closely.

## 2022-09-09 NOTE — Progress Notes (Addendum)
Pt with pseudoaneurysm seen on CT. Has hct drop on CBC.  Needs emergent surgery for repair. Bovine patch is deemed infected.  Will need resection with venous patch plasty. Muscle flap coverage.  Dialysis called, pt pulled and moving to the pre-op area.  OR room being prepared. Discussed with Dr. Stanford Breed who will follow his case. External iliac stents can not be removed.  Pt and family aware mortality high with this pathology.   Please give 2u RBCs, have another 2 packs ready Ensure type and screen    Broadus John MD

## 2022-09-09 NOTE — Progress Notes (Signed)
Pharmacy Antibiotic Note  Albert Harris is a 66 y.o. 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 Now with MRSA bacteremia. Noted patient is ESRD HD TTS PTA, with a right arm AV graft and a catheter in his right IJ. Pt known to pharmacy from vancomycin dosing. Now to start Zosyn for retroperitoneal infection - s/p repair of pseudoaneurysm 4/3 with cultures drawn.  Plan: Zosyn 2.25gm IV q8h Continue Vancomycin 750 mg qHD (MWF) Evaluate levels as appropriate  Monitor HD schedule, blood cultures, clinical progression  Height: 5\' 8"  (172.7 cm) Weight: 63 kg (138 lb 14.2 oz) IBW/kg (Calculated) : 68.4  Temp (24hrs), Avg:97.9 F (36.6 C), Min:97.5 F (36.4 C), Max:98.3 F (36.8 C)  Recent Labs  Lab 09/05/22 1405 09/05/22 1919 09/06/22 0608 09/06/22 1435 09/07/22 0233 09/07/22 0434 09/07/22 1535 09/08/22 0329 09/09/22 0411 09/09/22 1003 09/09/22 1209  WBC 19.7*   < > 19.8* 24.3* 24.4*  --  29.8* 29.0*  --  20.9*  --   CREATININE  --   --  7.28*  --   --  8.70*  --  4.77* 6.51*  --  5.90*  VANCORANDOM 14  --   --   --   --   --   --   --   --   --   --    < > = values in this interval not displayed.     Estimated Creatinine Clearance: 10.7 mL/min (A) (by C-G formula based on SCr of 5.9 mg/dL (H)).    Allergies  Allergen Reactions   Benadryl [Diphenhydramine] Rash   Lidocaine-Prilocaine Rash    Caused red marks up and down arm   Antimicrobials this admission:  Cefazolin 3/23 x 1 Vancomycin 3/24 >>  Zosyn 4/3 >>  Microbiology results:  4/3 L groin wound: 4/3 MRSA PCR: neg 3/27 Bcx: neg 3/25 Bcx: MRSA 3/23 Bcx: MRSA   Sherlon Handing, PharmD, BCPS Please see amion for complete clinical pharmacist phone list 09/09/2022, 6:42 PM

## 2022-09-09 NOTE — Progress Notes (Signed)
[facility] Progress Note  Subjective: got HD last night after [facility] placement and did well w/ dialysis.   Vitals:   09/09/22 1000 09/09/22 1033 09/09/22 1035 09/09/22 1050  BP: (!) 165/73 127/73  (!) 161/78  Pulse: 70 72  72  Resp: 14 17  18   Temp:  97.7 F (36.5 C)  97.9 F (36.6 C)  TempSrc:  Oral  Oral  SpO2: 97% 98%  99%  Weight:   63 kg 63 kg  Height:    5\' 8"  (1.727 m)    Exam: Constitutional: lying in bed, alert and pleasant ENMT: ears and nose without scars or lesions, MMM CV: normal rate, no edema Respiratory: bilateral chest rise, no iwob Gastrointestinal: soft, nonended Skin: no visible lesions or rashes Psych: mood and affect appropriate  Access: Right upper extremity AVG, no bruit or thrill  L fem wound covered w/ dressing    OP HD: TTS SW  4h   350/800  64.5kg  3K/2.5Ca bath  RFA AVG   Heparin none  - last OP HD 3/14, post wt 65.9kg - venofer 50mg  IV weekly - rocaltrol 0.25 mcg po tiw - no esa, last Hb 11.0  Summary: presented 08/21/22 w/ acute pain/ ischemic L foot. Hx of L EIA stenting on 3/04, and prior RLE bypass. Pt underwent endarterectomy of the left common femoral, profundofemoral, superficial femoral and external iliac vessels with bovine pericardial patch angioplasty and a left iliofemoral and profundofemoral thromboembolectomy on 3/15. Was in ICU until 3/18 for hypotension. Watson home on 3/18. Pt then returned on 3/22 to ED w/ N/V and unable to feel his L foot. BP's low in ED and was found to have L fem pseudoaneurysm. Had emergent CRRT for high K+. Then taken to OR 3/23 for primary repair of the distal EIA w/ hematoma evacuation. Extubated 3/24. BCx's from  admit were + for MRSA, IV vanc was started. On 3/26 was off CRRT and off pressors. R arm AVG was clotted and went for declot on 3/27 per VVS but graft re-clotted. Pt had regular HD on 3/28 then temp cath was removed for line holiday. IR placed new TDC on 4/01 and pt had regular HD later  the same day. Otherwise as below.    Assessment/ Plan: Left common femoral artery pseudoaneurysm - sp repair of L EIA on 3/23. Per VVS will go back to OR today due to recurrent bleeding.  ESRD: TTS HD.  SP CRRT 3/23- 3/26.  Received regular dialysis morning of 3/28 via temp cath. AVG clotted as below.  Had HD 4/01 after TDC placed by IR. Next HD today off schedule.  Volume - looks euvolemic to a bit dry on exam. 2-3kg under dry wt today.  CVA: stroke on MRI. Neuro signed off.  Shock - resolved MRSA Bacteremia - sp line holiday and getting IV vanc per pharmacy/ pmd. ID following Anemia: Hb 8-10 range. Likely multifactorial.  No IV iron given bacteremia. ESA ordered as darbe 100 mcg sq weekly on Wed.  Dialysis access: AVG clotted so was declotted on 3/27 per VVS then re-clotted. Temp cath removed 3/28. IR placed new TDC on 4/1.    Kelly Splinter MD [facility] 09/09/2022, 12:23 PM  Recent Labs  Lab 09/08/22 0329 09/09/22 0411 09/09/22 1003 09/09/22 1209  HGB 10.0*  --  8.2* 9.5*  ALBUMIN 2.1* 1.9*  --   --   CALCIUM 8.3* 8.3*  --   --   PHOS 4.4 6.3*  --   --  CREATININE 4.77* 6.51*  --  5.90*  K 4.0 4.2  --  3.9    No results for input(s): "IRON", "TIBC", "FERRITIN" in the last 168 hours. Inpatient medications:  [MAR Hold] (feeding supplement) PROSource Plus  30 mL Oral BID BM   [MAR Hold] Chlorhexidine Gluconate Cloth  6 each Topical Q0600   [MAR Hold] Chlorhexidine Gluconate Cloth  6 each Topical Q0600   [MAR Hold] darbepoetin (ARANESP) injection - DIALYSIS  100 mcg Subcutaneous Q Wed-1800   [MAR Hold] feeding supplement (NEPRO CARB STEADY)  237 mL Oral BID BM   [MAR Hold] heparin injection (subcutaneous)  5,000 Units Subcutaneous Q8H   [MAR Hold] multivitamin  1 tablet Oral QHS   [MAR Hold] pantoprazole  40 mg Oral Daily   [MAR Hold] rosuvastatin  10 mg Oral Daily   [MAR Hold] sodium chloride flush  10-40 mL Intracatheter Q12H   [MAR Hold] tetracaine  100 mg Infiltration Once     [MAR Hold] sodium chloride 10 mL/hr at 09/08/22 0903   sodium chloride Stopped (09/01/22 0400)   ceFAZolin     [MAR Hold] vancomycin     Place/Maintain arterial line **AND** [MAR Hold] sodium chloride, [MAR Hold] acetaminophen **OR** [MAR Hold] acetaminophen, [MAR Hold] alteplase, ceFAZolin, [MAR Hold] docusate sodium, [MAR Hold] heparin, [MAR Hold] hydrALAZINE, [MAR Hold]  HYDROmorphone (DILAUDID) injection, [MAR Hold] ondansetron (ZOFRAN) IV, [MAR Hold] mouth rinse, [MAR Hold] oxyCODONE, [MAR Hold] pentafluoroprop-tetrafluoroeth, [MAR Hold] polyethylene glycol, [MAR Hold] sodium chloride flush

## 2022-09-09 NOTE — Transfer of Care (Signed)
Immediate Anesthesia Transfer of Care Note  Patient: Albert Harris  Procedure(s) Performed: LEFT GROIN EXPLORATION (Left) RIGHT FEMORAL ARTERY ULTRASOUND GUIDANCE FOR VASCULAR ACCESS (Right: Groin) AORTOGRAM (Right: Groin) LEFT COMMON ILIAC TO COMMON FEMORAL  BYPASS W/ COMMON ILIAC STENTING (Left: Abdomen) VEIN HARVEST OF LEFT GREATER SAPHENOUS VEIN (Left: Groin)  Patient Location: PACU  Anesthesia Type:General  Level of Consciousness: awake and oriented  Airway & Oxygen Therapy: Patient Spontanous Breathing and Patient connected to nasal cannula oxygen  Post-op Assessment: Report given to RN  Post vital signs: Reviewed and stable  Last Vitals:  Vitals Value Taken Time  BP 104/48 09/09/22 1730  Temp    Pulse 74 09/09/22 1736  Resp 23 09/09/22 1736  SpO2 94 % 09/09/22 1736  Vitals shown include unvalidated device data.  Last Pain:  Vitals:   09/09/22 1050  TempSrc: Oral  PainSc:       Patients Stated Pain Goal: 4 ([id number])  Complications: No notable events documented.

## 2022-09-10 ENCOUNTER — Encounter (HOSPITAL_COMMUNITY)
Admission: EM | Disposition: A | Discharge status: Hospice | Payer: Self-pay | Source: Home / Self Care | Attending: Internal Medicine

## 2022-09-10 DIAGNOSIS — R6521 Severe sepsis with septic shock: Secondary | ICD-10-CM | POA: Diagnosis not present

## 2022-09-10 DIAGNOSIS — I6330 Cerebral infarction due to thrombosis of unspecified cerebral artery: Secondary | ICD-10-CM

## 2022-09-10 DIAGNOSIS — A419 Sepsis, unspecified organism: Secondary | ICD-10-CM | POA: Diagnosis not present

## 2022-09-10 DIAGNOSIS — E872 Acidosis, unspecified: Secondary | ICD-10-CM | POA: Diagnosis not present

## 2022-09-10 DIAGNOSIS — I724 Aneurysm of artery of lower extremity: Secondary | ICD-10-CM | POA: Diagnosis not present

## 2022-09-10 LAB — CBC
HCT: 27.3 % — ABNORMAL LOW (ref 39.0–52.0)
HCT: 24.9 % — ABNORMAL LOW (ref 39.0–52.0)
HCT: 22.6 % — ABNORMAL LOW (ref 39.0–52.0)
Hemoglobin: 9.1 g/dL — ABNORMAL LOW (ref 13.0–17.0)
Hemoglobin: 8 g/dL — ABNORMAL LOW (ref 13.0–17.0)
Hemoglobin: 7.6 g/dL — ABNORMAL LOW (ref 13.0–17.0)
MCH: 31.3 pg (ref 26.0–34.0)
MCH: 30.7 pg (ref 26.0–34.0)
MCH: 31.4 pg (ref 26.0–34.0)
MCHC: 33.3 g/dL (ref 30.0–36.0)
MCHC: 32.1 g/dL (ref 30.0–36.0)
MCHC: 33.6 g/dL (ref 30.0–36.0)
MCV: 93.8 fL (ref 80.0–100.0)
MCV: 95.4 fL (ref 80.0–100.0)
MCV: 93.4 fL (ref 80.0–100.0)
Platelets: 148 10*3/uL — ABNORMAL LOW (ref 150–400)
Platelets: 179 10*3/uL (ref 150–400)
Platelets: 177 10*3/uL (ref 150–400)
RBC: 2.91 MIL/uL — ABNORMAL LOW (ref 4.22–5.81)
RBC: 2.61 MIL/uL — ABNORMAL LOW (ref 4.22–5.81)
RBC: 2.42 MIL/uL — ABNORMAL LOW (ref 4.22–5.81)
RDW: 18.2 % — ABNORMAL HIGH (ref 11.5–15.5)
RDW: 18.5 % — ABNORMAL HIGH (ref 11.5–15.5)
RDW: 18.2 % — ABNORMAL HIGH (ref 11.5–15.5)
WBC: 19.1 10*3/uL — ABNORMAL HIGH (ref 4.0–10.5)
WBC: 16.6 10*3/uL — ABNORMAL HIGH (ref 4.0–10.5)
WBC: 14 10*3/uL — ABNORMAL HIGH (ref 4.0–10.5)
nRBC: 0 % (ref 0.0–0.2)
nRBC: 0 % (ref 0.0–0.2)
nRBC: 0 % (ref 0.0–0.2)

## 2022-09-10 LAB — PREPARE FRESH FROZEN PLASMA
Unit division: 0
Unit division: 0

## 2022-09-10 LAB — BASIC METABOLIC PANEL
Anion gap: 15 (ref 5–15)
Anion gap: 13 (ref 5–15)
BUN: 69 mg/dL — ABNORMAL HIGH (ref 8–23)
BUN: 32 mg/dL — ABNORMAL HIGH (ref 8–23)
CO2: 18 mmol/L — ABNORMAL LOW (ref 22–32)
CO2: 26 mmol/L (ref 22–32)
Calcium: 7.5 mg/dL — ABNORMAL LOW (ref 8.9–10.3)
Calcium: 7.5 mg/dL — ABNORMAL LOW (ref 8.9–10.3)
Chloride: 99 mmol/L (ref 98–111)
Chloride: 96 mmol/L — ABNORMAL LOW (ref 98–111)
Creatinine, Ser: 5.6 mg/dL — ABNORMAL HIGH (ref 0.61–1.24)
Creatinine, Ser: 3.27 mg/dL — ABNORMAL HIGH (ref 0.61–1.24)
GFR, Estimated: 10 mL/min — ABNORMAL LOW (ref >60)
GFR, Estimated: 20 mL/min — ABNORMAL LOW (ref >60)
Glucose, Bld: 120 mg/dL — ABNORMAL HIGH (ref 70–99)
Glucose, Bld: 100 mg/dL — ABNORMAL HIGH (ref 70–99)
Potassium: 5.5 mmol/L — ABNORMAL HIGH (ref 3.5–5.1)
Potassium: 3.2 mmol/L — ABNORMAL LOW (ref 3.5–5.1)
Sodium: 132 mmol/L — ABNORMAL LOW (ref 135–145)
Sodium: 135 mmol/L (ref 135–145)

## 2022-09-10 LAB — RENAL FUNCTION PANEL
Albumin: 2.2 g/dL — ABNORMAL LOW (ref 3.5–5.0)
Anion gap: 17 — ABNORMAL HIGH (ref 5–15)
BUN: 72 mg/dL — ABNORMAL HIGH (ref 8–23)
CO2: 21 mmol/L — ABNORMAL LOW (ref 22–32)
Calcium: 7.8 mg/dL — ABNORMAL LOW (ref 8.9–10.3)
Chloride: 98 mmol/L (ref 98–111)
Creatinine, Ser: 5.91 mg/dL — ABNORMAL HIGH (ref 0.61–1.24)
GFR, Estimated: 10 mL/min — ABNORMAL LOW (ref >60)
Glucose, Bld: 112 mg/dL — ABNORMAL HIGH (ref 70–99)
Phosphorus: 9 mg/dL — ABNORMAL HIGH (ref 2.5–4.6)
Potassium: 6 mmol/L — ABNORMAL HIGH (ref 3.5–5.1)
Sodium: 136 mmol/L (ref 135–145)

## 2022-09-10 LAB — BPAM FFP
Blood Product Expiration Date: 202404032359
Blood Product Expiration Date: 202404032359
ISSUE DATE / TIME: 202404031452
ISSUE DATE / TIME: 202404031452
Unit Type and Rh: 600
Unit Type and Rh: 6200

## 2022-09-10 LAB — SURGICAL PATHOLOGY

## 2022-09-10 SURGERY — ENDARTERECTOMY, FEMORAL
Anesthesia: Choice | Laterality: Left

## 2022-09-10 MED ORDER — CHLORHEXIDINE GLUCONATE CLOTH 2 % EX PADS
6.0000 | MEDICATED_PAD | Freq: Every day | CUTANEOUS | Status: DC
  Administered 2022-09-11: 6 via TOPICAL

## 2022-09-10 MED ORDER — VANCOMYCIN HCL 750 MG/150ML IV SOLN
750.0000 mg | Freq: Once | INTRAVENOUS | Status: AC
  Administered 2022-09-10: 750 mg via INTRAVENOUS
  Filled 2022-09-10: qty 150

## 2022-09-10 MED ORDER — SODIUM ZIRCONIUM CYCLOSILICATE 10 G PO PACK
10.0000 g | PACK | Freq: Once | ORAL | Status: AC
  Administered 2022-09-10: 10 g via ORAL
  Filled 2022-09-10: qty 1

## 2022-09-10 MED ORDER — METHOCARBAMOL 500 MG PO TABS
500.0000 mg | ORAL_TABLET | Freq: Three times a day (TID) | ORAL | Status: DC
  Administered 2022-09-10 – 2022-09-23 (×39): 500 mg via ORAL
  Filled 2022-09-10 (×39): qty 1

## 2022-09-10 MED ORDER — HEPARIN SODIUM (PORCINE) 1000 UNIT/ML IJ SOLN
INTRAMUSCULAR | Status: AC
  Administered 2022-09-10: 1000 [IU]
  Filled 2022-09-10: qty 4

## 2022-09-10 MED ORDER — VANCOMYCIN VARIABLE DOSE PER UNSTABLE RENAL FUNCTION (PHARMACIST DOSING): Status: DC

## 2022-09-10 MED ORDER — ACETAMINOPHEN 325 MG PO TABS
650.0000 mg | ORAL_TABLET | Freq: Four times a day (QID) | ORAL | Status: DC
  Administered 2022-09-10 – 2022-09-23 (×47): 650 mg via ORAL
  Filled 2022-09-10 (×50): qty 2

## 2022-09-10 NOTE — Progress Notes (Signed)
Albert Harris Progress Note  Subjective: pt went back to OR for complicated surgery of the L groin/ vessels.   Vitals:   09/10/22 0616 09/10/22 0756 09/10/22 0800 09/10/22 0855  BP: 137/65  138/63   Pulse: 61  61   Resp: 15  14 14   Temp:  97.7 F (36.5 C)    TempSrc:  Oral    SpO2: 98%  96% 99%  Weight:      Height:        Exam: Constitutional: lying in bed, alert and pleasant ENMT: ears and nose without scars or lesions, MMM CV: normal rate, no edema Respiratory: bilateral chest rise, no iwob Gastrointestinal: soft, nonended Skin: no visible lesions or rashes Psych: mood and affect appropriate  Access: Right upper extremity AVG, no bruit or thrill  L fem wound covered w/ dressing    OP HD: TTS SW  4h   350/800  64.5kg  3K/2.5Ca bath  RFA AVG   Heparin none  - last OP HD 3/14, post wt 65.9kg - venofer 50mg  IV weekly - rocaltrol 0.25 mcg po tiw - no esa, last Hb 11.0  Summary: presented 08/21/22 w/ acute pain/ ischemic L foot. Hx of L EIA stenting on 3/04, and prior RLE bypass. Pt underwent endarterectomy of the left common femoral, profundofemoral, superficial femoral and external iliac vessels with bovine pericardial patch angioplasty and a left iliofemoral and profundofemoral thromboembolectomy on 3/15. Was in ICU until 3/18 for hypotension. Albert Harris home on 3/18. Pt then returned on 3/22 to ED w/ N/V and unable to feel his L foot. BP's low in ED and was found to have L fem pseudoaneurysm. Had emergent CRRT for high K+. Then taken to OR 3/23 for primary repair of the distal EIA w/ hematoma evacuation. Extubated 3/24. BCx's from  admit were + for MRSA, IV vanc was started. On 3/26 was off CRRT and off pressors. R arm AVG was clotted and went for declot on 3/27 per VVS but graft re-clotted. Pt had regular HD on 3/28 then temp cath was removed for line holiday. IR placed new TDC on 4/01 and pt had regular HD later the same day. Otherwise as below.    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 Went back to OR 4/3 for L CIA to profunda bypass, L CIA pta and stenting, and muscle flap (sartorious to groin).  ESRD: TTS HD.  SP CRRT 3/23- 3/26.  Received regular dialysis morning of 3/28 via temp cath. AVG then clotted so IR placed TDC on 4/01. HD is off schedule this week w/ HD yest and Monday. Plan HD again today to get back on schedule and to manage ^K+ vol overload.  Hyperkalemia - K+ 6.0 today, low K+ HD planned.  Volume - mild edema today, up 6-7kg by wts today.  CVA: stroke on MRI. Neuro signed off.  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 ID has since signed off.  Anemia: Hb 8-10 range. Likely multifactorial.  No IV iron given bacteremia. ESA ordered as darbe 100 mcg sq weekly on Wed.  Dialysis access: AVG clotted 3/27, went for declot by VVS and then AVG re-clotted. Temp cath removed 3/28. IR placed new TDC on 4/1. Using [facility]   Kelly Splinter MD [facility] 09/10/2022, 11:16 AM  Recent Labs  Lab 09/09/22 0411 09/09/22 1003 09/10/22 0038 09/10/22 0627  HGB  --    < > 9.1* 8.0*  ALBUMIN 1.9*  --   --  2.2*  CALCIUM 8.3*   < > 7.5* 7.8*  PHOS 6.3*  --   --  9.0*  CREATININE 6.51*   < > 5.60* 5.91*  K 4.2   < > 5.5* 6.0*   < > = values in this interval not displayed.    No results for input(s): "IRON", "TIBC", "FERRITIN" in the last 168 hours. Inpatient medications:  (feeding supplement) PROSource Plus  30 mL Oral BID BM   sodium chloride   Intravenous Once   sodium chloride   Intravenous Once   aspirin EC  81 mg Oral Q0600   Chlorhexidine Gluconate Cloth  6 each Topical Q0600   Chlorhexidine Gluconate Cloth  6 each Topical Q0600   darbepoetin (ARANESP) injection - DIALYSIS  100 mcg Subcutaneous Q Wed-1800   feeding supplement (NEPRO CARB STEADY)  237 mL  Oral BID BM   heparin injection (subcutaneous)  5,000 Units Subcutaneous Q8H   HYDROmorphone   Intravenous Q4H   multivitamin  1 tablet Oral QHS   pantoprazole  40 mg Oral Daily   rosuvastatin  10 mg Oral Daily   sodium chloride flush  10-40 mL Intracatheter Q12H   tetracaine  100 mg Infiltration Once   vancomycin variable dose per unstable renal function (pharmacist dosing)   Does not apply See admin instructions    sodium chloride Stopped (09/08/22 0904)   sodium chloride 0 mL (09/05/22 1840)   sodium chloride     sodium chloride 75 mL/hr at 09/10/22 0800   magnesium sulfate bolus IVPB     Place/Maintain arterial line **AND** sodium chloride, sodium chloride, acetaminophen **OR** acetaminophen, alteplase, diphenhydrAMINE **OR** diphenhydrAMINE, docusate sodium, heparin, hydrALAZINE, magnesium sulfate bolus IVPB, naloxone **AND** sodium chloride flush, ondansetron (ZOFRAN) IV, ondansetron (ZOFRAN) IV, mouth rinse, oxyCODONE, pentafluoroprop-tetrafluoroeth, polyethylene glycol, sodium chloride flush

## 2022-09-10 NOTE — Progress Notes (Signed)
Physical Therapy Treatment Patient Details Name: Albert Harris MRN: [id number] DOB: 02-04-1956 Today's Date: 09/10/2022   History of Present Illness 67 year old male  ER with abdominal pain, nausea, diaphoresis and unable to feel his Lt foot. Hypotensive with lactic acidosis in ER.  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 CRRT 3/22-3/26.  3/22 repair of distal Lt external iliac artery. On 3/30, Hgb drop from 9 to 7 with worsening abdominal pain, CT abd 3/30 shows slightly larger anterior pelvic extraperitoneal hematoma. PMHx: PAD, ESRD HD, recently hospitalized 3/16-18 for acute on chronic left lower extremity ischemia.    PT Comments    Per vascular surgeon note, pt to work with therapy but not to sit up longer than 20 minutes and to try to keep L hip in a neutral position. Pt on standard ICU bed which hinges at hip automatically when attempting to elevate HoB. Entire bed placed in reverse Trendelenburg to achieve HoB upright. Pt too painful and refusing OOB. Dr Maren Beach agreeable to tilt bed order to facilitate coming to upright without hip flexion. Pt able to perform limited AAROM LE and AROM in UE. D/c plans remain appropriate when pt medically stable. PT will continue to follow acutely.   Recommendations for follow up therapy are one component of a multi-disciplinary discharge planning process, led by the attending physician.  Recommendations may be updated based on patient status, additional functional criteria and insurance authorization.  Follow Up Recommendations  Can patient physically be transported by private vehicle: No    Assistance Recommended at Discharge Frequent or constant Supervision/Assistance  Patient can return home with the following Assist for transportation;Two people to help with walking and/or transfers;Two people to help with bathing/dressing/bathroom;Assistance with cooking/housework;Help with stairs or ramp for entrance   Equipment  Recommendations  Other (comment) (tbd)       Precautions / Restrictions Precautions Precautions: Fall Precaution Comments: L groin wound vac, drain Restrictions Weight Bearing Restrictions: No     Mobility  Bed Mobility               General bed mobility comments: pt with increased pain and limited L hip flexion, did not attempt bed mobility, have ordered tilt bed to be able to come to standing with less hip flexion            Cognition Arousal/Alertness: Awake/alert Behavior During Therapy: WFL for tasks assessed/performed Overall Cognitive Status: Impaired/Different from baseline Area of Impairment: Following commands, Safety/judgement, Problem solving                   Current Attention Level: Divided Memory: Decreased short-term memory Following Commands: Follows one step commands consistently, Follows one step commands with increased time     Problem Solving: Decreased initiation, Requires verbal cues, Difficulty sequencing, Slow processing General Comments: pt requires increased cuing for movement, increased time required, ocassionally blankly stares into space        Exercises General Exercises - Upper Extremity Shoulder Flexion: AROM, Left, 10 reps, Supine Elbow Flexion: AROM, Both, 10 reps, Supine Elbow Extension: AROM, Both, 10 reps, Supine Wrist Flexion: AROM, Both, 10 reps, Supine Wrist Extension: AROM, Both, 10 reps, Supine General Exercises - Lower Extremity Ankle Circles/Pumps: AROM, Right, 5 reps, Supine, Left, AAROM (no active DF LLE) Heel Slides: AROM, Right, 5 reps, Supine    General Comments General comments (skin integrity, edema, etc.): VSS, daughter present      Pertinent Vitals/Pain Pain Assessment Pain Assessment: Faces Faces Pain Scale:  Hurts whole lot Pain Location: L groin and flank Pain Descriptors / Indicators: Grimacing, Guarding Pain Intervention(s): Limited activity within patient's tolerance, Monitored during  session, Repositioned, PCA encouraged, Utilized relaxation techniques    Home Living                          Prior Function            PT Goals (current goals can now be found in the care plan section) Acute Rehab PT Goals Patient Stated Goal: Pt wants to get better and go home. PT Goal Formulation: With patient/family Time For Goal Achievement: 09/14/22 Potential to Achieve Goals: Good Progress towards PT goals: Not progressing toward goals - comment (mobility limited by hip flexion restrictions and pain)    Frequency    Min 3X/week      PT Plan Current plan remains appropriate       AM-PAC PT "6 Clicks" Mobility   Outcome Measure  Help needed turning from your back to your side while in a flat bed without using bedrails?: Total Help needed moving from lying on your back to sitting on the side of a flat bed without using bedrails?: Total Help needed moving to and from a bed to a chair (including a wheelchair)?: Total Help needed standing up from a chair using your arms (e.g., wheelchair or bedside chair)?: Total Help needed to walk in hospital room?: Total Help needed climbing 3-5 steps with a railing? : Total 6 Click Score: 6    End of Session   Activity Tolerance: Patient limited by pain Patient left: in bed;with call bell/phone within reach;with family/visitor present;with bed alarm set Nurse Communication: Mobility status PT Visit Diagnosis: Other abnormalities of gait and mobility (R26.89);Muscle weakness (generalized) (M62.81);Difficulty in walking, not elsewhere classified (R26.2)     Time: [id number] PT Time Calculation (min) (ACUTE ONLY): 29 min  Charges:  $Therapeutic Exercise: 23-37 mins                     Jilian West B. Migdalia Dk PT, DPT Acute Rehabilitation Services Please use secure chat or  Call Office [phone]    [place] 09/10/2022, 3:30 PM

## 2022-09-10 NOTE — Anesthesia Postprocedure Evaluation (Signed)
Anesthesia Post Note  Patient: ANKER KRAVEC  Procedure(s) Performed: LEFT GROIN EXPLORATION (Left) RIGHT FEMORAL ARTERY ULTRASOUND GUIDANCE FOR VASCULAR ACCESS (Right: Groin) AORTOGRAM (Right: Groin) LEFT COMMON ILIAC TO COMMON FEMORAL  BYPASS W/ COMMON ILIAC STENTING (Left: Abdomen) VEIN HARVEST OF LEFT GREATER SAPHENOUS VEIN (Left: Groin)     Patient location during evaluation: PACU Anesthesia Type: General Level of consciousness: awake and alert Pain management: pain level controlled Vital Signs Assessment: post-procedure vital signs reviewed and stable Respiratory status: spontaneous breathing, nonlabored ventilation, respiratory function stable and patient connected to nasal cannula oxygen Cardiovascular status: blood pressure returned to baseline and stable Postop Assessment: no apparent nausea or vomiting Anesthetic complications: no   No notable events documented.  Last Vitals:  Vitals:   09/10/22 0800 09/10/22 0855  BP: 138/63   Pulse: 61   Resp: 14 14  Temp:    SpO2: 96% 99%    Last Pain:  Vitals:   09/10/22 0855  TempSrc:   PainSc: 9                  Tiajuana Amass

## 2022-09-10 NOTE — Progress Notes (Signed)
Received patient in bed in 2H02 Alert and oriented.  Informed consent signed and in chart.   TX duration:3 hrs. Cut off 30 mins d/t pt had back pain. Unable to UF off 3 L as ordered d/t hypotension. Dr. Jonnie Finner notified.    Patient tolerated well.  without acute distress.  Hand-off given to patient's nurse.   Access used: catheter Access issues: BFR 300. Lines revered.  Total UF removed: 500 ml Medication(s) given: none, Post HD VS: 112/65. P 77. R 18 Post HD weight: 71.5 kg   [facility]

## 2022-09-10 NOTE — Progress Notes (Signed)
PROGRESS NOTE Albert Harris  [id number] DOB: Jul 15, 1955 DOA: 08/28/2022 PCP: Kerin Perna, NP  Brief Narrative/Hospital Course: 67 year old male with history of ESRD on HD TTS, PVD,COPD, essential hypertension, chronic systolic congestive heart failure with ejection fraction 35 to 40%, presented with abdominal pain nausea and diaphoresis.Patient was also hypotensive with a lactic acidosis and was admitted to [facility] a pulseless left lower extremity was found to have femoral pseudoaneurysm.Culture came back with MRSA bacteremia.  08/29/22:S/p OR with Dr. Virl Cagey for left femoral exploration with pseudoaneurysm repair,hematoma evacuation>showed dehiscence at proximal aspect of bovine pericardial patch at the suture line.  Patient had required pressor for septic shock.  Due to severe hyperkalemia, patient was had a emergent CRRT. 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 09/08/22 Transferred to [facility] service > overnight bleeding from the groin area dressing changes. 09/09/22 CTA: Thrombosis of LEFT CFA pseudoaneurysm with new 3.5 cm filling defect, consistent with arterial thrombus, extending from the PSA neck and into the distal CFA. This is at risk for distal embolization and potential acute LEFT lower extremity ischemia.New 8 mm distal LEFT EIA pseudoaneurysm, medial to the stent distal landing zone. Mild interval enlargement of retroperitoneal hematoma, in a craniocaudal dimension measuring 12.2 cm (previously 11.2 cm). No CTA evidence of active extravasation. Patent RIGHT femoral-to-popliteal graft and bilateral iliac stents.Severe burden of splanchnic arterial atherosclerosis, at risk for chronic mesenteric ischemia. 09/09/22: S/P left common iliac to profunda femoris bypass with  ipsilateral nonreversed greater saphenous vein in anatomic tunnel, left common iliac artery angioplasty and stenting, left greater saphenous vein harvest, left sartorius muscle of left groin, left lower extremity angiography with first-order cannulation> OR culture showed GPC in pairs   Subjective: Patient seen and examined. Daughter at the bedside Resting well, has no new complaints, complaints of decreased sensation, weakness and cold left lower extremity Overnight patient afebrile normotensive not hypoxic Labs reviewed potassium> 5.5, CBC with WBC further improving at 16.6 hemoglobin at 8.0  Assessment and Plan:  Septic shock with MRSA bacteremia-Initially admitted to ICU on vasopressors>shock resolved Left Groin wound infection Right groin hematoma with a left common femoral artery pseudoaneurysm S/P Urgent OR 09/09/22 for left groin bleeding> s/p left common iliac to profunda femoris bypass with ipsilateral nonreversed greater saphenous vein in anatomic tunnel, left common iliac artery angioplasty and stenting, left greater saphenous vein harvest, left sartorius muscle of left groin, left lower extremity angiography with first-order cannulation: 09/08/22 Transferred to [facility] service > overnight bleeding from the groin area dressing changes>CTA 4/3 came back later w/ Thrombosis of LEFT CFA pseudoaneurysm with new 3.5 cm filling defect, consistent with arterial thrombus, extending from the PSA neck and into the distal CFA. This is at risk for distal embolization and potential acute LEFT lower extremity ischemia.New 8 mm distal LEFT EIA pseudoaneurysm, medial to the stent distal landing zone. Mild interval enlargement of retroperitoneal hematoma, in a craniocaudal dimension measuring 12.2 cm (previously 11.2 cm). No CTA evidence of active extravasation. Patent RIGHT femoral-to-popliteal graft and bilateral iliac stents.Severe burden of splanchnic arterial atherosclerosis, at risk for chronic mesenteric  ischemia. 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  Zosyn added 4/4.  Left groin incision with wound VAC with good seal> cont x 7 days,left foot appears mottled with darkening of the left fifth toe> patient remains at very high risk of left leg ischemia/amputation.  Vascular surgery managing appreciate input he has been transferred to ICU postop.  PVD with history of left external iliac artery and common artery stenting and right femoropopliteal bypass with left common femoral artery pseudoaneurysm on CT 3/22, status post left femoral artery pseudoaneurysm repair 3/22, S/P AVG thrombectomy attempted but patient became hemodynamically unstable and procedure was aborted, followed by vascular  s/p OR with surgery as above on 09/09/22.see above.  Retroperitoneal hemorrhage on CTA 3/22, on 3/30 CT abdomen intrapelvic asked retroperitoneal hematoma within the prevesical space, right greater than left extension of hemorrhage within the extraperitoneal and lateral pelvic walls. CT 09/09/22: Mild interval enlargement of retroperitoneal hematoma, in a craniocaudal dimension measuring 12.2 cm (previously 11.2 cm).No CTA evidence of active extravasation Monitor. Plan pe avvs    Acute respiratory failure with hypoxemia.   COPD Currently on room air.  Add incentive spirometry, continue inhaler as needed  Chronic CHF with preserved EF-based on echo from 4/1 Pulmonary hypertension: TEE 4/1 showed EF 50-55%,severe LVH, no left atrial thrombus detected.  Fluid being addressed with dialysis-per nephrology.   ESRD on HD TTS: nephrology o/b.AVG clotted on 3/27 and temp catheter removed 3/28 IR placed new TDC on 4/1.  S/P line holiday and  getting IV vancomycin. Cont HD per nephrology Hyponatremia/hypokalemia/hyperkalemia/ metabolic acidosis: K up-ordered Lokelma recheck potassium today  Acute blood loss anemia Anemia of chronic kidney disease: Patient needing multiple blood transfusions in the setting of operative intervention, retroperitoneal bleed pseudoaneurysm/left groin bleeding - monitor and transfuse.  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 Awaiting neurology recommendation for anticoagulation/antiplatelets-once okay with vascular,, now having bleeding on the left groin/RP[ hematoma. Continue Crestor  HLD: Continue Crestor History of hypertension with transient hypotension on 3/30 was managed with IV fluid bolus, currently not needing antihypertensives.  Poor oral intake: Dietitian following if intake does not pick up >plan for core track placement on 4/5 Moderate protein calorie malnutrition augment diet as tolerated Nutrition Problem: Moderate Malnutrition (in the context of chronic illness (ESRD)) Etiology: poor appetite Signs/Symptoms: moderate fat depletion, severe muscle depletion Interventions: Refer to RD note for recommendations, Liberalize Diet, Nepro shake, MVI   Pressure injury of skin on coccyx stage II POA, wound care   DVT prophylaxis: heparin injection 5,000 Units Start: 09/10/22 0600 SCD's Start: 09/09/22 1841 Code Status:   Code Status: Full Code Family Communication: plan of care discussed with patient at bedside. Patient status is: Patient  because of ongoing need for IV antibiotics and bleeding and pain Level of care: ICU   Dispo: The patient is from: home lives with wife            Anticipated disposition: CIR EVENTUALLY TBD Objective: Vitals last 24 hrs: Vitals:  09/10/22 0406 09/10/22 0437 09/10/22 0500 09/10/22 0616  BP:   137/63 137/65  Pulse:   62 61  Resp: 13  11 15   Temp:      TempSrc:      SpO2: 99%  98% 98%  Weight:  71.9 kg    Height:       Weight change: 0.7 kg  Physical Examination: General exam: AAOX3 weak,older appearing HEENT:Oral mucosa moist, Ear/Nose WNL grossly, dentition normal. Respiratory system: bilaterally clear BS,no use of accessory muscle.  TDC+ on chest Cardiovascular system: S1 & S2 +, regular rate. Gastrointestinal system: Abdomen soft, NT,ND,BS+ Nervous System:Alert, awake, moving extremities and grossly nonfocal Extremities: Left groin with wound VAC in place with tight seal, left lower extremity appears cold left foot mottling Skin: No rashes,no icterus. MSK: Normal muscle bulk,tone, power  Medications reviewed:  Scheduled Meds:  (feeding supplement) PROSource Plus  30 mL Oral BID BM   sodium chloride   Intravenous Once   sodium chloride   Intravenous Once   aspirin EC  81 mg Oral Q0600   Chlorhexidine Gluconate Cloth  6 each Topical Q0600   Chlorhexidine Gluconate Cloth  6 each Topical Q0600   darbepoetin (ARANESP) injection - DIALYSIS  100 mcg Subcutaneous Q Wed-1800   feeding supplement (NEPRO CARB STEADY)  237 mL Oral BID BM   heparin injection (subcutaneous)  5,000 Units Subcutaneous Q8H   HYDROmorphone   Intravenous Q4H   multivitamin  1 tablet Oral QHS   pantoprazole  40 mg Oral Daily   rosuvastatin  10 mg Oral Daily   sodium chloride flush  10-40 mL Intracatheter Q12H   tetracaine  100 mg Infiltration Once   vancomycin variable dose per unstable renal function (pharmacist dosing)   Does not apply See admin instructions   Continuous Infusions:  sodium chloride  Stopped (09/08/22 0904)   sodium chloride 0 mL (09/05/22 1840)   sodium chloride     sodium chloride 75 mL/hr at 09/10/22 0600   magnesium sulfate bolus IVPB     piperacillin-tazobactam (ZOSYN)  IV Stopped (09/10/22 0441)   vancomycin      Diet Order             Diet regular Room service appropriate? Yes; Fluid consistency: Thin; Fluid restriction: 1200 mL Fluid  Diet effective 0500                   Intake/Output Summary (Last 24 hours) at 09/10/2022 0749 Last data filed at 09/10/2022 0600 Gross per 24 hour  Intake 3669.54 ml  Output 1145 ml  Net 2524.54 ml    Net IO Since Admission: 5,863.06 mL [09/10/22 0749]  Wt Readings from Last 3 Encounters:  09/10/22 71.9 kg  08/23/22 64.1 kg  08/15/22 64.9 kg     Unresulted Labs (From admission, onward)     Start     Ordered   09/10/22 [id number]  Basic metabolic panel  2 times daily,   R     Question:  Specimen collection method  Answer:  Lab=Lab collect   09/10/22 0736   09/10/22 0737  CBC  2 times daily,   R     Question:  Specimen collection method  Answer:  Lab=Lab collect   09/10/22 0736   09/10/22 0500  CBC  Daily,   R     Question:  Specimen collection method  Answer:  Lab=Lab collect   09/09/22 1411   09/10/22 [id number]  Basic metabolic panel  Daily,  R     Question:  Specimen collection method  Answer:  Lab=Lab collect   09/09/22 1411   08/29/22 0500  Renal function panel (daily at 0500)  Daily,   R      08/28/22 2020          Data Reviewed: I have personally reviewed following labs and imaging studies CBC: Recent Labs  Lab 09/08/22 0329 09/09/22 1003 09/09/22 1209 09/09/22 1606 09/09/22 1716 09/09/22 1820 09/10/22 0038 09/10/22 0627  WBC 29.0* 20.9*  --   --   --  23.1* 19.1* 16.6*  HGB 10.0* 8.2*   < > 8.8* 8.8* 9.7* 9.1* 8.0*  HCT 29.9* 26.3*   < > 26.0* 26.0* 29.4* 27.3* 24.9*  MCV 93.1 97.4  --   --   --  94.5 93.8 95.4  PLT 231 237  --   --   --  205 148* 179   < > = values in this interval not  displayed.   Basic Metabolic Panel: Recent Labs  Lab 09/04/22 0838 09/05/22 0053 09/06/22 0608 09/07/22 0434 09/08/22 0329 09/09/22 0411 09/09/22 1209 09/09/22 1513 09/09/22 1606 09/09/22 1716 09/09/22 1820 09/10/22 0038  NA 134* 131* 133* 129* 134* 136 135 134* 136 134* 135 132*  K 3.7 3.8 4.3 4.5 4.0 4.2 3.9 5.3* 4.9 6.3* 4.9 5.5*  CL 94* 95* 96* 90* 95* 98 98  --   --   --  99 99  CO2 24 21* 19* 19* 23 23  --   --   --   --  22 18*  GLUCOSE 90 100* 92 98 235* 120* 104*  --   --   --  117* 120*  BUN 48* 61* 81* 100* 43* 80* 56*  --   --   --  62* 69*  CREATININE 4.21* 5.41* 7.28* 8.70* 4.77* 6.51* 5.90*  --   --   --  5.20* 5.60*  CALCIUM 8.7* 8.3* 8.5* 8.3* 8.3* 8.3*  --   --   --   --  7.7* 7.5*  MG 2.3  --   --  2.4  --   --   --   --   --   --   --   --   PHOS 4.2 4.7* 6.3* 7.8* 4.4 6.3*  --   --   --   --   --   --    GFR: Estimated Creatinine Clearance: 12.2 mL/min (A) (by C-G formula based on SCr of 5.6 mg/dL (H)). Liver Function Tests: Recent Labs  Lab 09/05/22 0053 09/06/22 0608 09/07/22 0434 09/08/22 0329 09/09/22 0411  ALBUMIN 1.9* 2.0* 1.7* 2.1* 1.9*  CBG: Recent Labs  Lab 09/09/22 0808  GLUCAP 114*   No results for input(s): "PROCALCITON", "LATICACIDVEN" in the last 168 hours.   Recent Results (from the past 240 hour(s))  Culture, blood (Routine X 2) w Reflex to ID Panel     Status: None   Collection Time: 08/31/22  9:06 AM   Specimen: BLOOD LEFT FOREARM  Result Value Ref Range Status   Specimen Description BLOOD LEFT FOREARM  Final   Special Requests   Final    BOTTLES DRAWN AEROBIC AND ANAEROBIC Blood Culture adequate volume   Culture   Final    NO GROWTH 5 DAYS Performed at [facility], [address] [place], [place] [address]    Report Status 09/05/2022 FINAL  Final  Culture, blood (Routine X 2) w Reflex to ID  Panel     Status: Abnormal   Collection Time: 08/31/22  9:07 AM   Specimen: BLOOD LEFT ARM  Result Value Ref Range  Status   Specimen Description BLOOD LEFT ARM  Final   Special Requests   Final    BOTTLES DRAWN AEROBIC AND ANAEROBIC Blood Culture results may not be optimal due to an inadequate volume of blood received in culture bottles   Culture  Setup Time   Final    GRAM POSITIVE COCCI IN CLUSTERS AEROBIC BOTTLE ONLY CRITICAL VALUE NOTED.  VALUE IS CONSISTENT WITH PREVIOUSLY REPORTED AND CALLED VALUE.    Culture (A)  Final    STAPHYLOCOCCUS AUREUS SUSCEPTIBILITIES PERFORMED ON PREVIOUS CULTURE WITHIN THE LAST 5 DAYS. Performed at [facility], [place] [address], [place], [place] [address]    Report Status 09/03/2022 FINAL  Final  Aerobic/Anaerobic Culture w Gram Stain (surgical/deep wound)     Status: None   Collection Time: 09/02/22  2:06 PM   Specimen: Wound  Result Value Ref Range Status   Specimen Description WOUND  Final   Special Requests AV GRAFT THROMBUS  Final   Gram Stain NO WBC SEEN NO ORGANISMS SEEN   Final   Culture   Final    No growth aerobically or anaerobically. Performed at [facility], [place] [address] [place], [place] [address]    Report Status 09/08/2022 FINAL  Final  Culture, blood (Routine X 2) w Reflex to ID Panel     Status: None   Collection Time: 09/02/22  4:44 PM   Specimen: BLOOD LEFT HAND  Result Value Ref Range Status   Specimen Description BLOOD LEFT HAND  Final   Special Requests   Final    BOTTLES DRAWN AEROBIC AND ANAEROBIC Blood Culture results may not be optimal due to an inadequate volume of blood received in culture bottles   Culture   Final    NO GROWTH 5 DAYS Performed at [facility], [place] [address], [place], [place] [address]    Report Status 09/07/2022 FINAL  Final  Culture, blood (Routine X 2) w Reflex to ID Panel     Status: None   Collection Time: 09/02/22  4:45 PM   Specimen: BLOOD LEFT HAND  Result Value Ref Range Status   Specimen Description BLOOD LEFT HAND  Final   Special Requests   Final    BOTTLES DRAWN AEROBIC  AND ANAEROBIC Blood Culture adequate volume   Culture   Final    NO GROWTH 5 DAYS Performed at [facility], [place] [address] [place], [place] [address]    Report Status 09/07/2022 FINAL  Final  Surgical pcr screen     Status: None   Collection Time: 09/09/22 11:16 AM   Specimen: Nasal Mucosa; Nasal Swab  Result Value Ref Range Status   MRSA, PCR NEGATIVE NEGATIVE Final   Staphylococcus aureus NEGATIVE NEGATIVE Final    Comment: (NOTE) The Xpert SA Assay (FDA approved for NASAL specimens in patients 69 years of age and older), is one component of a comprehensive surveillance program. It is not intended to diagnose infection nor to guide or monitor treatment. Performed at [facility], [place] [address], [place], [place] [address]   Aerobic/Anaerobic Culture w Gram Stain (surgical/deep wound)     Status: None (Preliminary result)   Collection Time: 09/09/22  1:28 PM   Specimen: Groin, Left; Wound  Result Value Ref Range Status   Specimen Description  GROIN LEFT  Final   Special Requests LEFT GROIN HEMATOMA PT ON VANC ANCEF  Final   Gram Stain   Final    ABUNDANT WBC PRESENT, PREDOMINANTLY PMN RARE GRAM POSITIVE COCCI IN PAIRS Performed at [facility], [place] [address], [place], [place] [address]    Culture PENDING  Incomplete   Report Status PENDING  Incomplete  Aerobic/Anaerobic Culture w Gram Stain (surgical/deep wound)     Status: None (Preliminary result)   Collection Time: 09/09/22  1:28 PM   Specimen: Groin, Left; Wound  Result Value Ref Range Status   Specimen Description TISSUE LEFT GROIN  Final   Special Requests PT ON VANC ANCEF LEFT HEMATOMA  Final   Gram Stain   Final    ABUNDANT WBC PRESENT, PREDOMINANTLY PMN RARE GRAM POSITIVE COCCI IN PAIRS Performed at [facility], [place] [address], [place], [place] [address]    Culture PENDING  Incomplete   Report Status PENDING  Incomplete  Aerobic/Anaerobic Culture w Gram Stain (surgical/deep wound)      Status: None (Preliminary result)   Collection Time: 09/09/22  2:48 PM   Specimen: PATH Other; Tissue  Result Value Ref Range Status   Specimen Description WOUND  Final   Special Requests LEFT COMMON ILIAC STENTS PT ON VANC ANCEF  Final   Gram Stain   Final    ABUNDANT WBC PRESENT,BOTH PMN AND MONONUCLEAR FEW GRAM POSITIVE COCCI IN PAIRS Performed at [facility], [place] [address], [place], [place] [address]    Culture PENDING  Incomplete   Report Status PENDING  Incomplete  Aerobic/Anaerobic Culture w Gram Stain (surgical/deep wound)     Status: None (Preliminary result)   Collection Time: 09/09/22  2:53 PM   Specimen: PATH Vessel; Tissue  Result Value Ref Range Status   Specimen Description TISSUE  Final   Special Requests VESSEL PT ON VANC ANCEF  Final   Gram Stain   Final    NO WBC SEEN RARE GRAM POSITIVE COCCI Performed at [facility], [address] [place], [place] [address]    Culture PENDING  Incomplete   Report Status PENDING  Incomplete    Antimicrobials: Anti-infectives (From admission, onward)    Start     Dose/Rate Route Frequency Ordered Stop   09/10/22 1200  vancomycin (VANCOREADY) IVPB 500 mg/100 mL  Status:  Discontinued        500 mg 100 mL/hr over 60 Minutes Intravenous Every T-Th-Sa (Hemodialysis) 09/09/22 0719 09/10/22 0719   09/10/22 0815  vancomycin (VANCOREADY) IVPB 750 mg/150 mL        750 mg 150 mL/hr over 60 Minutes Intravenous  Once 09/10/22 0719     09/10/22 0718  vancomycin variable dose per unstable renal function (pharmacist dosing)         Does not apply See admin instructions 09/10/22 0719     09/09/22 2000  piperacillin-tazobactam (ZOSYN) IVPB 2.25 g        2.25 g 100 mL/hr over 30 Minutes Intravenous Every 8 hours 09/09/22 1849     09/09/22 1145  ceFAZolin (ANCEF) 2-4 GM/100ML-% IVPB       Note to Pharmacy: Ladoris Gene A: cabinet override      09/09/22 1145 09/09/22 2359   09/08/22 0830  vancomycin (VANCOREADY) IVPB 750  mg/150 mL        750 mg 150 mL/hr over 60 Minutes Intravenous  Once 09/08/22 0743 09/08/22 1004   09/07/22 1200  ceFAZolin (ANCEF) IVPB 2g/100 mL premix       Note to Pharmacy: Give in IR for surgical prophylaxis   2 g 200 mL/hr over 30 Minutes Intravenous  Once 09/07/22 1100 09/07/22 1327   09/07/22 1101  ceFAZolin (ANCEF) 2-4 GM/100ML-% IVPB       Note to Pharmacy: Roosvelt Maser N: cabinet override      09/07/22 1101 09/07/22 1335   09/07/22 0734  vancomycin variable dose per unstable renal function (pharmacist dosing)  Status:  Discontinued         Does not apply See admin instructions 09/07/22 0734 09/09/22 0718   09/05/22 1600  vancomycin (VANCOREADY) IVPB 750 mg/150 mL        750 mg 150 mL/hr over 60 Minutes Intravenous  Once 09/05/22 1505 09/05/22 1717   09/02/22 1227  ceFAZolin (ANCEF) 2-4 GM/100ML-% IVPB       Note to Pharmacy: Dallie Piles, [place]: cabinet override      09/02/22 1227 09/03/22 0044   09/02/22 0520  vancomycin variable dose per unstable renal function (pharmacist dosing)  Status:  Discontinued         Does not apply See admin instructions 09/02/22 0521 09/05/22 0940   08/31/22 1800  vancomycin (VANCOREADY) IVPB 750 mg/150 mL  Status:  Discontinued        750 mg 150 mL/hr over 60 Minutes Intravenous Every 24 hours 08/31/22 0822 09/02/22 0521   08/30/22 1800  vancomycin (VANCOREADY) IVPB 1500 mg/300 mL        1,500 mg 150 mL/hr over 120 Minutes Intravenous STAT 08/30/22 1739 08/30/22 2139      Culture/Microbiology    Component Value Date/Time   SDES TISSUE 09/09/2022 1453   SPECREQUEST VESSEL PT ON VANC ANCEF 09/09/2022 1453   CULT PENDING 09/09/2022 1453   REPTSTATUS PENDING 09/09/2022 1453  Other culture-see note  Radiology Studies: DG OR LOCAL ABDOMEN  Result Date: 09/09/2022 CLINICAL DATA:  Rule out any unintentional retained foreign bodies. EXAM: OR LOCAL ABDOMEN COMPARISON:  September 05, 2022 FINDINGS: Post left common iliac to common femoral bypass. Left  femoral line seen. Aortobifemoral stent graft. No radiopaque foreign bodies are seen on this single image. Soft tissue edema noted. IMPRESSION: No radiopaque foreign bodies seen. Electronically Signed   By: Fidela Salisbury M.D.   On: 09/09/2022 17:01   HYBRID OR IMAGING ([facility] ONLY)  Result Date: 09/09/2022 There is no interpretation for this exam.  This order is for images obtained during a surgical procedure.  Please See "Surgeries" Tab for more information regarding the procedure.   CT ANGIO AO+BIFEM W & OR WO CONTRAST  Result Date: 09/09/2022 CLINICAL DATA:  Claudication or leg ischemia EXAM: CT ANGIOGRAPHY OF ABDOMINAL AORTA WITH ILIOFEMORAL RUNOFF TECHNIQUE: Multidetector CT imaging of the abdomen, pelvis and lower extremities was performed using the standard protocol during bolus administration of intravenous contrast. Multiplanar CT image reconstructions and MIPs were obtained to evaluate the vascular anatomy. RADIATION DOSE REDUCTION: This exam was performed according to the departmental dose-optimization program which includes automated exposure control, adjustment of the mA and/or kV according to patient size and/or use of iterative reconstruction technique. CONTRAST:  159mL OMNIPAQUE IOHEXOL 350 MG/ML SOLN COMPARISON:  CT AP, 09/05/2022.  CTA runoff, 08/28/2022. FINDINGS: VASCULAR Aorta: Severe burden of aortic calcified and calcified atherosclerosis. No aneurysm, dissection, vasculitis or significant stenosis. Celiac: Calcified ostial atherosclerosis with short segment proximal celiac artery stenosis, approximately 50%, with mild post stenotic dilatation. Well opacified celiac axis. No evidence of aneurysm,  dissection or vasculitis. SMA: Calcified noncalcified ostial atherosclerosis with 2.9 cm segment of up to severe proximal SMA stenosis. No evidence of aneurysm, dissection or vasculitis. Renals: Single renal arteries are present bilaterally. Severe bilateral renal artery stenoses, greater on  LEFT with at least 2 cm proximal occlusion. Severe burden of bilateral calcified atherosclerosis of the segmental and interlobar arteries. No aneurysm or dissection. IMA: Severe burden of ostial atherosclerosis, otherwise distally patent. No evidence of aneurysm, dissection or vasculitis. RIGHT Lower Extremity Inflow: Patent RIGHT CIA and EIA overlapping stents. Occluded RIGHT hypogastric artery. No evidence of aneurysm, dissection or vasculitis. Outflow: Occluded RIGHT native SFA with short segment retrograde reconstitution from the TP trunk to the level of the adductor hiatus. RIGHT femoral-to-popliteal bypass, widely patent. Diseased but patent RIGHT profunda femoris. No evidence of aneurysm or dissection. Runoff: Severely diseased but patent, at least two-vessel, runoff to the ankle. LEFT Lower Extremity Inflow: Patent LEFT CIA and EIA overlapping stents. Occluded LEFT hypogastric artery. New pseudoaneurysm at the distal LEFT EIA, measuring 8 mm and medial to the stent distal landing zone. See key image. No aneurysm or dissection. Outflow: *Near-complete thrombosis of prior pseudoaneurysm at the LEFT CFA pseudoaneurysm. *New linear filling defect extending from the pseudoaneurysm neck and extending peripherally into the distal CFA, measuring up to 3.5 cm. *Patent LEFT profunda femoris, with occluded LEFT SFA along its entirety. Distal reconstitution of the popliteal artery with diseased but patent trifurcation. *No evidence of aneurysm or dissection. Runoff: Severely diseased but patent, at least two-vessel, runoff to the ankle. Veins: No obvious venous abnormality within the limitations of this arterial phase study. Review of the MIP images confirms the above findings. NON-VASCULAR Lower chest: No acute abnormality. Calcified multivessel coronary atherosclerosis. Nodular opacities at the lung bases, greater on RIGHT measuring 1.4 cm, similar in appearance to 09/05/2022 comparison though new since 08/12/2022.  Hepatobiliary: No focal liver abnormality is seen. No gallstones, gallbladder wall thickening, or biliary dilatation. Pancreas: No pancreatic ductal dilatation or surrounding inflammatory changes. Spleen: Normal in size without focal abnormality. Perihilar accessory spleen, abutting the tail of pancreas and measuring 2.5 cm Adrenals/Urinary Tract: Adrenal glands are unremarkable. Atrophic kidneys. RIGHT perirenal stranding. No renal calculi, focal lesion, or hydronephrosis. Bladder is decompressed. Stomach/Bowel: Stomach is within normal limits. Appendix appears normal. Moderate burden of sigmoid diverticulosis. No evidence of bowel wall thickening, distention, or inflammatory changes. Lymphatic: No enlarged abdominal or pelvic lymph nodes. Reproductive: Prostate is borderline enlarged, otherwise unremarkable. Other: *No abdominal wall hernia. No abdominopelvic ascites. *Retroperitoneal hematoma, extending cephalad along the rectus sheath, and anterior inferiorly to the retropubic and LEFT anterior pelvic spaces, measuring up to 6.4 x 12.2 x 13.0 cm (AP by transaxial by CC). Previously 6.4 x 11.2 cm (AP by transaxial) *Hematoma extension along the LEFT iliopsoas, new since 08/28/2022 comparison Musculoskeletal: No interval osseous abnormality. Bilateral groin cutdown scars, more recently on LEFT with a overlying surgical staples. IMPRESSION: Since CTA runoff dated 08/28/2022; VASCULAR 1. Thrombosis of LEFT CFA pseudoaneurysm with new 3.5 cm filling defect, consistent with arterial thrombus, extending from the PSA neck and into the distal CFA. This is at risk for distal embolization and potential acute LEFT lower extremity ischemia. 2. New 8 mm distal LEFT EIA pseudoaneurysm, medial to the stent distal landing zone. 3. Mild interval enlargement of retroperitoneal hematoma, in a craniocaudal dimension measuring 12.2 cm (previously 11.2 cm). No CTA evidence of active extravasation. 4. Patent RIGHT femoral-to-popliteal  graft and bilateral iliac stents. 5. Severe burden of splanchnic  arterial atherosclerosis, at risk for chronic mesenteric ischemia. NON-VASCULAR 1. Bibasilar nodular pulmonary opacities, new since 08/2022, favored infectious/inflammatory. Attention on follow-up. 2. Sigmoid diverticulosis and Aortic Atherosclerosis (ICD10-I70.0). These results will be called to the ordering clinician or representative by the Radiologist Assistant, and communication documented in the PACS or [employer]. Michaelle Birks, MD [facility] Radiology Electronically Signed   By: Michaelle Birks M.D.   On: 09/09/2022 11:05     LOS: 13 days   Antonieta Pert, MD [facility]  09/10/2022, 7:49 AM

## 2022-09-10 NOTE — Progress Notes (Signed)
Pharmacy Antibiotic Note  Albert Harris is a 67 y.o. 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 Now with MRSA bacteremia. Noted patient is ESRD HD TTS PTA, with a right arm AV graft and a catheter in his right IJ. Pharmacy consulted to dose vancomycin, pt also started on Zosyn for RP infection s/p pseudoaneurysm repair 4/3. Last iHD was 4/3, pt will need vancomycin dose.  Plan: Continue Zosyn 2.25g IV q8h Vancomycin 750mg  IV x1 Will follow HD schedule for further dosing  Height: 5\' 8"  (172.7 cm) Weight: 71.9 kg (158 lb 8.2 oz) IBW/kg (Calculated) : 68.4  Temp (24hrs), Avg:97.8 F (36.6 C), Min:97.5 F (36.4 C), Max:98.3 F (36.8 C)  Recent Labs  Lab 09/05/22 1405 09/05/22 1919 09/07/22 1535 09/08/22 0329 09/09/22 0411 09/09/22 1003 09/09/22 1209 09/09/22 1820 09/10/22 0038  WBC 19.7*   < > 29.8* 29.0*  --  20.9*  --  23.1* 19.1*  CREATININE  --    < >  --  4.77* 6.51*  --  5.90* 5.20* 5.60*  VANCORANDOM 14  --   --   --   --   --   --   --   --    < > = values in this interval not displayed.     Estimated Creatinine Clearance: 12.2 mL/min (A) (by C-G formula based on SCr of 5.6 mg/dL (H)).    Allergies  Allergen Reactions   Benadryl [Diphenhydramine] Rash   Lidocaine-Prilocaine Rash    Caused red marks up and down arm   Antimicrobials this admission:  Cefazolin 3/23 x 1 Vancomycin 3/24 >>  Zosyn 4/3 >>  Microbiology results:  4/3 L groin wound: 4/3 MRSA PCR: neg 3/27 Bcx: neg 3/25 Bcx: MRSA 3/23 Bcx: MRSA   Arrie Senate, PharmD, BCPS, [facility] Clinical Pharmacist [phone] Please check AMION for all [facility] Pharmacy numbers 09/10/2022

## 2022-09-10 NOTE — Progress Notes (Signed)
Al[facility] for Infectious Disease  Date of Admission:  08/28/2022     Principal Problem:   Lactic acidosis Active Problems:   Metabolic encephalopathy   ESRD (end stage renal disease) on dialysis   Malnutrition of moderate degree   Septic shock   Pseudoaneurysm of femoral artery   Decreased functional mobility   MRSA (methicillin resistant Staphylococcus aureus) septicemia   Pressure injury of skin          Assessment: 67 year old male with past medical history of MRSA bacteremia in 2022, PAD, ESRD HD, recently hospitalized 3/16-18 for acute on chronic left lower extremity ischemia.  He had undergone femoral endarterectomy with bovine pericardial patch, left iliofemoral profundofemoral thromboembolectomy, stent of the left common and external iliac arteries 3/16 with Dr. 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 - On 3/22 CT angio showed 1.1 cm left common femoral artery pseudoaneurysm, new.  New anterior pelvic extraperitoneal hematoma. -OR with Dr. Virl Cagey for left groin exploration, hematoma evacuation on 3/23. OR findings showed dehiscence at proximal aspect of bovine pericardial patch at the suture line. -Per OR note on 3/16 there is a stent in the left common and external iliac arteries.  The stents are left in place, patient will need to be on suppressive antibiotics following completion of likely 4-6 weeks of IV therapy. -MRI brain showed 4 mm acute infarct in right subinsular white matter on 3/29 -MRI L spine negative for infection, MR C spine showed improvement of marrow edema at C5-6. 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 -CT AP on 3/30 showed extension of hemorrhage in extraperitoneal lateral pelvic wall.  -TTE showed AV/MC valve thickening. TEE no vegetation - No plans at this time for bovine patch/stent removal given risks. From the  antibiotic perspective will plan on suppressive regimen once treatment IV course complete.  - OR on 09/09/22 for left common iliac artery angioplasty/stenting/interposition patch graft,  groin flap, Noted patch was dbrided and did not appear healthy. Has JP drain. Noted to have darkening of left 5 th toe->high risk for left AKA.  Recommendations: -  D/C pip-tazo - Continue vancomycin for now, OR Cx form 09/09/22 + stpah aureus and e faecium. 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 No new complaints.  Interval:  Afebrile overnight. Wbc 16k Review of Systems: Review of Systems  All other systems reviewed and are negative.    Scheduled Meds:  (feeding supplement) PROSource Plus  30 mL Oral BID BM   sodium chloride   Intravenous Once   sodium chloride   Intravenous Once   aspirin EC  81 mg Oral Q0600   Chlorhexidine Gluconate Cloth  6 each Topical Q0600   Chlorhexidine Gluconate Cloth  6 each Topical Q0600   [  START ON 09/11/2022] Chlorhexidine Gluconate Cloth  6 each Topical Q0600   darbepoetin (ARANESP) injection - DIALYSIS  100 mcg Subcutaneous Q Wed-1800   feeding supplement (NEPRO CARB STEADY)  237 mL Oral BID BM   heparin injection (subcutaneous)  5,000 Units Subcutaneous Q8H   HYDROmorphone   Intravenous Q4H   multivitamin  1 tablet Oral QHS   pantoprazole  40 mg Oral  Daily   rosuvastatin  10 mg Oral Daily   sodium chloride flush  10-40 mL Intracatheter Q12H   tetracaine  100 mg Infiltration Once   vancomycin variable dose per unstable renal function (pharmacist dosing)   Does not apply See admin instructions   Continuous Infusions:  sodium chloride Stopped (09/08/22 0904)   sodium chloride 0 mL (09/05/22 1840)   sodium chloride     sodium chloride 75 mL/hr at 09/10/22 0800   magnesium sulfate bolus IVPB     PRN Meds:.Place/Maintain arterial line **AND** sodium chloride, sodium chloride, acetaminophen **OR** acetaminophen, alteplase, diphenhydrAMINE **OR** diphenhydrAMINE, docusate sodium, heparin, hydrALAZINE, magnesium sulfate bolus IVPB, naloxone **AND** sodium chloride flush, ondansetron (ZOFRAN) IV, ondansetron (ZOFRAN) IV, mouth rinse, oxyCODONE, pentafluoroprop-tetrafluoroeth, polyethylene glycol, sodium chloride flush Allergies  Allergen Reactions   Benadryl [Diphenhydramine] Rash   Lidocaine-Prilocaine Rash    Caused red marks up and down arm    OBJECTIVE: Vitals:   09/10/22 0756 09/10/22 0800 09/10/22 0855 09/10/22 1135  BP:  138/63    Pulse:  61    Resp:  14 14   Temp: 97.7 F (36.5 C)   98.2 F (36.8 C)  TempSrc: Oral   Oral  SpO2:  96% 99%   Weight:      Height:       Body mass index is 24.1 kg/m.  Physical Exam Constitutional:      General: He is not in acute distress.    Appearance: He is normal weight. He is not toxic-appearing.  HENT:     Head: Normocephalic and atraumatic.     Right Ear: External ear normal.     Left Ear: External ear normal.     Nose: No congestion or rhinorrhea.     Mouth/Throat:     Mouth: Mucous membranes are moist.     Pharynx: Oropharynx is clear.  Eyes:     Extraocular Movements: Extraocular movements intact.     Conjunctiva/sclera: Conjunctivae normal.     Pupils: Pupils are equal, round, and reactive to light.  Cardiovascular:     Rate and Rhythm: Normal rate and regular rhythm.      Heart sounds: No murmur heard.    No friction rub. No gallop.  Pulmonary:     Effort: Pulmonary effort is normal.     Breath sounds: Normal breath sounds.  Abdominal:     General: Abdomen is flat. Bowel sounds are normal.     Palpations: Abdomen is soft.  Musculoskeletal:        General: No swelling.     Cervical back: Normal range of motion and neck supple.     Comments: Left groin wound with staples  Skin:    General: Skin is warm and dry.     Comments: Wound vac  Neurological:     General: No focal deficit present.     Mental Status: He is oriented to person, place, and time.  Psychiatric:        Mood and Affect: Mood normal.       Lab Results Lab Results  Component Value  Date   WBC 16.6 (H) 09/10/2022   HGB 8.0 (L) 09/10/2022   HCT 24.9 (L) 09/10/2022   MCV 95.4 09/10/2022   PLT 179 09/10/2022    Lab Results  Component Value Date   CREATININE 5.91 (H) 09/10/2022   BUN 72 (H) 09/10/2022   NA 136 09/10/2022   K 6.0 (H) 09/10/2022   CL 98 09/10/2022   CO2 21 (L) 09/10/2022    Lab Results  Component Value Date   ALT <5 08/28/2022   AST 21 08/28/2022   ALKPHOS 122 08/28/2022   BILITOT 1.2 08/28/2022        Laurice Record, [facility] 09/10/2022, 12:14 PM

## 2022-09-10 NOTE — Progress Notes (Addendum)
Progress Note    09/10/2022 6:49 AM 1 Day Post-Op  Subjective:  sleeping-awakes to voice.  Says he is sore. When floating heel off bed, he said it hurt around his ankle.  RN reports pt needing PCA overnight and pain more controlled.   Afebrile   Vitals:   09/10/22 0500 09/10/22 0616  BP: 137/63 137/65  Pulse: 62 61  Resp: 11 15  Temp:    SpO2: 98% 98%    Physical Exam: General:  resting, wakes easily.  No distress Cardiac:  regular Lungs:  non labored Incisions:  left groin with wound vac with good seal.  Bandage on thigh clean (I did not remove this today) Extremities:  left foot mottled and 5th toe darkened. Distal foot and toes are cool to touch.    Peroneal doppler signal found about 2-3" above the ankle.  Unable to get doppler flow around the foot.  +doppler signal right DP/PT; right foot is warm.   CBC    Component Value Date/Time   WBC 19.1 (H) 09/10/2022 0038   RBC 2.91 (L) 09/10/2022 0038   HGB 9.1 (L) 09/10/2022 0038   HGB 10.4 (L) 06/18/2021 1431   HCT 27.3 (L) 09/10/2022 0038   HCT 31.6 (L) 06/18/2021 1431   PLT 148 (L) 09/10/2022 0038   PLT 142 (L) 06/18/2021 1431   MCV 93.8 09/10/2022 0038   MCV 96 06/18/2021 1431   MCH 31.3 09/10/2022 0038   MCHC 33.3 09/10/2022 0038   RDW 18.2 (H) 09/10/2022 0038   RDW 14.3 06/18/2021 1431   LYMPHSABS 1.5 08/28/2022 1810   LYMPHSABS 1.5 06/18/2021 1431   MONOABS 0.7 08/28/2022 1810   EOSABS 0.1 08/28/2022 1810   EOSABS 1.0 (H) 06/18/2021 1431   BASOSABS 0.1 08/28/2022 1810   BASOSABS 0.1 06/18/2021 1431    BMET    Component Value Date/Time   NA 132 (L) 09/10/2022 0038   NA 141 06/18/2021 1431   K 5.5 (H) 09/10/2022 0038   CL 99 09/10/2022 0038   CO2 18 (L) 09/10/2022 0038   GLUCOSE 120 (H) 09/10/2022 0038   BUN 69 (H) 09/10/2022 0038   BUN 20 06/18/2021 1431   CREATININE 5.60 (H) 09/10/2022 0038   CREATININE 1.15 02/19/2017 1049   CALCIUM 7.5 (L) 09/10/2022 0038   CALCIUM 8.5 (L) 06/10/2020 0900    GFRNONAA 10 (L) 09/10/2022 0038   GFRAA 6 (L) 03/04/2020 1045    INR    Component Value Date/Time   INR 1.2 08/12/2022 1501     Intake/Output Summary (Last 24 hours) at 09/10/2022 0649 Last data filed at 09/10/2022 0600 Gross per 24 hour  Intake 3669.54 ml  Output 1145 ml  Net 2524.54 ml   Specimen Description TISSUE  Special Requests VESSEL PT ON VANC ANCEF  Gram Stain NO WBC SEEN RARE GRAM POSITIVE COCCI Performed at [facility], [place] [address], [place], [place] [address]  Culture PENDING  Report Status PENDING   Specimen Description WOUND  Special Requests LEFT COMMON ILIAC STENTS PT ON VANC ANCEF  Gram Stain ABUNDANT WBC PRESENT,BOTH PMN AND MONONUCLEAR FEW GRAM POSITIVE COCCI IN PAIRS Performed at [facility], [place] [address], [place], [place] [address]  Culture PENDING  Report Status PENDING      Component 1 d ago  Specimen Description GROIN LEFT  Special Requests LEFT GROIN HEMATOMA PT ON VANC ANCEF  Gram Stain ABUNDANT WBC PRESENT, PREDOMINANTLY PMN RARE GRAM POSITIVE COCCI IN PAIRS Performed at [facility]  [facility], [place] [address] [place], [place] [address]  Culture PENDING  Report Status PENDING       Assessment/Plan:  67 y.o. male is s/p:  L femoral exploration with pseudoaneurysm repair 08/29/2022 RUE AVG thrombectomy 09/02/2022 [place] placement 09/07/2022 And  left common iliac to profunda femoris bypass with ipsilateral non-reversed greater saphenous vein in anatomic tunnel, left common iliac artery angioplasty and stenting, left greater saphenous vein harvest, left sartorious muscle flap to groin, left lower extremity angiography with first order cannulation 09/09/2022    -pt left groin incision with wound vac with good seal.  Left foot appears mottled this am with darkening of the left 5th toe.  His toes on the left are cool to touch.   Very high risk for left AKA.   -GPC in pairs present on gram stain on culture obtained in OR intraoperatively.   Pt now on vanc and zosyn.  Leukocytosis improved to 19k -JP with 95cc out total (70cc last shift) and vac with 150cc out total (100cc last shift).  Would leave JP drain today. -DVT prophylaxis:  sq heparin   Leontine Locket, PA-C [facility] [phone] 09/10/2022 6:49 AM  VASCULAR STAFF ADDENDUM: I have independently interviewed and examined the patient. I agree with the above.  POD#1 left ilio-femoral bypass with ipsilateral non reversed greater saphenous vein; iliac stenting; sartorius flap Looks good overall considering extensive reconstruction yesterday. PCA for pain last night. A little better today.  Foot feels OK - some pain about the ankle Doppler flow present in left peroneal artery this morning RE: sartorius flap: OK to mobilize with PT / OT. Do not leave in chair for > 20 minutes. Try to keep hip neutral. Keep JP in place for now. VAC to groin x 7 days. When removing VAC important to remove 7 small strips of white VAC that are wicking the incision. Continue vanc / zosyn until final culture data from OR. Can de-escalate as sensitivities return.   Yevonne Aline. Stanford Breed, MD [facility] Phone Number: [phone] 09/10/2022 7:45 AM

## 2022-09-10 NOTE — Progress Notes (Addendum)
STROKE TEAM PROGRESS NOTE   SUBJECTIVE (INTERVAL HISTORY) His daughter is at the bedside. Pt lying in bed, stated that lower back sore but left leg pain has improved some. He still has left leg weakness, with cool temp on the toes but still warm of the upper foot and the leg. He had left LE wound wash out and left common iliac artery angioplasty, stenting and bypass surgery with VVS yesterday. He has refused further MRI    OBJECTIVE Temp:  [97.6 F (36.4 C)-98.3 F (36.8 C)] 97.7 F (36.5 C) (04/04 0756) Pulse Rate:  [58-79] 61 (04/04 0800) Cardiac Rhythm: Normal sinus rhythm (04/04 0800) Resp:  [11-42] 14 (04/04 0855) BP: (82-141)/(48-71) 138/63 (04/04 0800) SpO2:  [93 %-100 %] 99 % (04/04 0855) FiO2 (%):  [21 %] 21 % (04/04 0134) Weight:  [71.9 kg] 71.9 kg (04/04 0437)  Recent Labs  Lab 09/09/22 0808  GLUCAP 114*    Recent Labs  Lab 09/04/22 0838 09/05/22 0053 09/06/22 [id number] 09/07/22 0434 09/08/22 0329 09/09/22 0411 09/09/22 1209 09/09/22 1513 09/09/22 1606 09/09/22 1716 09/09/22 1820 09/10/22 0038 09/10/22 0627  NA 134*   < > 133* 129* 134* 136 135   < > 136 134* 135 132* 136  K 3.7   < > 4.3 4.5 4.0 4.2 3.9   < > 4.9 6.3* 4.9 5.5* 6.0*  CL 94*   < > 96* 90* 95* 98 98  --   --   --  99 99 98  CO2 24   < > 19* 19* 23 23  --   --   --   --  22 18* 21*  GLUCOSE 90   < > 92 98 235* 120* 104*  --   --   --  117* 120* 112*  BUN 48*   < > 81* 100* 43* 80* 56*  --   --   --  62* 69* 72*  CREATININE 4.21*   < > 7.28* 8.70* 4.77* 6.51* 5.90*  --   --   --  5.20* 5.60* 5.91*  CALCIUM 8.7*   < > 8.5* 8.3* 8.3* 8.3*  --   --   --   --  7.7* 7.5* 7.8*  MG 2.3  --   --  2.4  --   --   --   --   --   --   --   --   --   PHOS 4.2   < > 6.3* 7.8* 4.4 6.3*  --   --   --   --   --   --  9.0*   < > = values in this interval not displayed.   Recent Labs  Lab 09/06/22 0608 09/07/22 0434 09/08/22 0329 09/09/22 0411 09/10/22 0627  ALBUMIN 2.0* 1.7* 2.1* 1.9* 2.2*   Recent Labs   Lab 09/08/22 0329 09/09/22 1003 09/09/22 1209 09/09/22 1606 09/09/22 1716 09/09/22 1820 09/10/22 0038 09/10/22 0627  WBC 29.0* 20.9*  --   --   --  23.1* 19.1* 16.6*  HGB 10.0* 8.2*   < > 8.8* 8.8* 9.7* 9.1* 8.0*  HCT 29.9* 26.3*   < > 26.0* 26.0* 29.4* 27.3* 24.9*  MCV 93.1 97.4  --   --   --  94.5 93.8 95.4  PLT 231 237  --   --   --  205 148* 179   < > = values in this interval not displayed.   No results for input(s): "CKTOTAL", "CKMB", "CKMBINDEX", "TROPONINI"  in the last 168 hours. No results for input(s): "LABPROT", "INR" in the last 72 hours. No results for input(s): "COLORURINE", "LABSPEC", "PHURINE", "GLUCOSEU", "HGBUR", "BILIRUBINUR", "KETONESUR", "PROTEINUR", "UROBILINOGEN", "NITRITE", "LEUKOCYTESUR" in the last 72 hours.  Invalid input(s): "APPERANCEUR"     Component Value Date/Time   CHOL 94 09/05/2022 1405   CHOL 154 11/27/2020 1011   TRIG 102 09/05/2022 1405   HDL 29 (L) 09/05/2022 1405   HDL 53 11/27/2020 1011   CHOLHDL 3.2 09/05/2022 1405   VLDL 20 09/05/2022 1405   LDLCALC 45 09/05/2022 1405   LDLCALC 89 11/27/2020 1011   Lab Results  Component Value Date   HGBA1C 5.1 09/05/2022      Component Value Date/Time   LABOPIA NONE DETECTED 07/18/2019 0603   COCAINSCRNUR NONE DETECTED 07/18/2019 0603   LABBENZ NONE DETECTED 07/18/2019 0603   AMPHETMU NONE DETECTED 07/18/2019 0603   THCU NONE DETECTED 07/18/2019 0603   LABBARB NONE DETECTED 07/18/2019 0603    No results for input(s): "ETH" in the last 168 hours.  I have personally reviewed the radiological images below and agree with the radiology interpretations.  DG OR LOCAL ABDOMEN  Result Date: 09/09/2022 CLINICAL DATA:  Rule out any unintentional retained foreign bodies. EXAM: OR LOCAL ABDOMEN COMPARISON:  September 05, 2022 FINDINGS: Post left common iliac to common femoral bypass. Left femoral line seen. Aortobifemoral stent graft. No radiopaque foreign bodies are seen on this single image. Soft  tissue edema noted. IMPRESSION: No radiopaque foreign bodies seen. Electronically Signed   By: Fidela Salisbury M.D.   On: 09/09/2022 17:01   HYBRID OR IMAGING ([facility] ONLY)  Result Date: 09/09/2022 There is no interpretation for this exam.  This order is for images obtained during a surgical procedure.  Please See "Surgeries" Tab for more information regarding the procedure.   CT ANGIO AO+BIFEM W & OR WO CONTRAST  Result Date: 09/09/2022 CLINICAL DATA:  Claudication or leg ischemia EXAM: CT ANGIOGRAPHY OF ABDOMINAL AORTA WITH ILIOFEMORAL RUNOFF TECHNIQUE: Multidetector CT imaging of the abdomen, pelvis and lower extremities was performed using the standard protocol during bolus administration of intravenous contrast. Multiplanar CT image reconstructions and MIPs were obtained to evaluate the vascular anatomy. RADIATION DOSE REDUCTION: This exam was performed according to the departmental dose-optimization program which includes automated exposure control, adjustment of the mA and/or kV according to patient size and/or use of iterative reconstruction technique. CONTRAST:  174mL OMNIPAQUE IOHEXOL 350 MG/ML SOLN COMPARISON:  CT AP, 09/05/2022.  CTA runoff, 08/28/2022. FINDINGS: VASCULAR Aorta: Severe burden of aortic calcified and calcified atherosclerosis. No aneurysm, dissection, vasculitis or significant stenosis. Celiac: Calcified ostial atherosclerosis with short segment proximal celiac artery stenosis, approximately 50%, with mild post stenotic dilatation. Well opacified celiac axis. No evidence of aneurysm, dissection or vasculitis. SMA: Calcified noncalcified ostial atherosclerosis with 2.9 cm segment of up to severe proximal SMA stenosis. No evidence of aneurysm, dissection or vasculitis. Renals: Single renal arteries are present bilaterally. Severe bilateral renal artery stenoses, greater on LEFT with at least 2 cm proximal occlusion. Severe burden of bilateral calcified atherosclerosis of the segmental  and interlobar arteries. No aneurysm or dissection. IMA: Severe burden of ostial atherosclerosis, otherwise distally patent. No evidence of aneurysm, dissection or vasculitis. RIGHT Lower Extremity Inflow: Patent RIGHT CIA and EIA overlapping stents. Occluded RIGHT hypogastric artery. No evidence of aneurysm, dissection or vasculitis. Outflow: Occluded RIGHT native SFA with short segment retrograde reconstitution from the TP trunk to the level of the adductor hiatus. RIGHT femoral-to-popliteal bypass,  widely patent. Diseased but patent RIGHT profunda femoris. No evidence of aneurysm or dissection. Runoff: Severely diseased but patent, at least two-vessel, runoff to the ankle. LEFT Lower Extremity Inflow: Patent LEFT CIA and EIA overlapping stents. Occluded LEFT hypogastric artery. New pseudoaneurysm at the distal LEFT EIA, measuring 8 mm and medial to the stent distal landing zone. See key image. No aneurysm or dissection. Outflow: *Near-complete thrombosis of prior pseudoaneurysm at the LEFT CFA pseudoaneurysm. *New linear filling defect extending from the pseudoaneurysm neck and extending peripherally into the distal CFA, measuring up to 3.5 cm. *Patent LEFT profunda femoris, with occluded LEFT SFA along its entirety. Distal reconstitution of the popliteal artery with diseased but patent trifurcation. *No evidence of aneurysm or dissection. Runoff: Severely diseased but patent, at least two-vessel, runoff to the ankle. Veins: No obvious venous abnormality within the limitations of this arterial phase study. Review of the MIP images confirms the above findings. NON-VASCULAR Lower chest: No acute abnormality. Calcified multivessel coronary atherosclerosis. Nodular opacities at the lung bases, greater on RIGHT measuring 1.4 cm, similar in appearance to 09/05/2022 comparison though new since 08/12/2022. Hepatobiliary: No focal liver abnormality is seen. No gallstones, gallbladder wall thickening, or biliary  dilatation. Pancreas: No pancreatic ductal dilatation or surrounding inflammatory changes. Spleen: Normal in size without focal abnormality. Perihilar accessory spleen, abutting the tail of pancreas and measuring 2.5 cm Adrenals/Urinary Tract: Adrenal glands are unremarkable. Atrophic kidneys. RIGHT perirenal stranding. No renal calculi, focal lesion, or hydronephrosis. Bladder is decompressed. Stomach/Bowel: Stomach is within normal limits. Appendix appears normal. Moderate burden of sigmoid diverticulosis. No evidence of bowel wall thickening, distention, or inflammatory changes. Lymphatic: No enlarged abdominal or pelvic lymph nodes. Reproductive: Prostate is borderline enlarged, otherwise unremarkable. Other: *No abdominal wall hernia. No abdominopelvic ascites. *Retroperitoneal hematoma, extending cephalad along the rectus sheath, and anterior inferiorly to the retropubic and LEFT anterior pelvic spaces, measuring up to 6.4 x 12.2 x 13.0 cm (AP by transaxial by CC). Previously 6.4 x 11.2 cm (AP by transaxial) *Hematoma extension along the LEFT iliopsoas, new since 08/28/2022 comparison Musculoskeletal: No interval osseous abnormality. Bilateral groin cutdown scars, more recently on LEFT with a overlying surgical staples. IMPRESSION: Since CTA runoff dated 08/28/2022; VASCULAR 1. Thrombosis of LEFT CFA pseudoaneurysm with new 3.5 cm filling defect, consistent with arterial thrombus, extending from the PSA neck and into the distal CFA. This is at risk for distal embolization and potential acute LEFT lower extremity ischemia. 2. New 8 mm distal LEFT EIA pseudoaneurysm, medial to the stent distal landing zone. 3. Mild interval enlargement of retroperitoneal hematoma, in a craniocaudal dimension measuring 12.2 cm (previously 11.2 cm). No CTA evidence of active extravasation. 4. Patent RIGHT femoral-to-popliteal graft and bilateral iliac stents. 5. Severe burden of splanchnic arterial atherosclerosis, at risk for  chronic mesenteric ischemia. NON-VASCULAR 1. Bibasilar nodular pulmonary opacities, new since 08/2022, favored infectious/inflammatory. Attention on follow-up. 2. Sigmoid diverticulosis and Aortic Atherosclerosis (ICD10-I70.0). These results will be called to the ordering clinician or representative by the Radiologist Assistant, and communication documented in the PACS or [employer]. Michaelle Birks, MD [facility] Electronically Signed   By: Michaelle Birks M.D.   On: 09/09/2022 11:05   DG C-Arm 1-60 Min  Result Date: 09/07/2022 CLINICAL DATA:  Surgery.  Dialysis catheter EXAM: DG C-ARM 1-60 MIN CONTRAST:  None FLUOROSCOPY: Fluoroscopy Time:  29 seconds Radiation Exposure Index (if provided by the fluoroscopic device): 4.24 mGy Number of Acquired Spot Images: 1 COMPARISON:  None Available. FINDINGS: Catheter in place with indwelling wire. Imaging was obtained to aid in treatment IMPRESSION: Intraoperative fluoroscopy Electronically Signed   By: Jill Side M.D.   On: 09/07/2022 15:53   EP STUDY  Result Date: 09/07/2022 See surgical note for result.  ECHO TEE  Result Date: 09/07/2022    TRANSESOPHOGEAL ECHO REPORT   Patient Name:   Albert Harris Date of Exam: 09/07/2022 Medical Rec #:  [id number]      Height:       68.0 in Accession #:    [id number]     Weight:       96.8 lb Date of Birth:  [birth year]     BSA:          1.501 m Patient Age:    89 years       BP:           148/70 mmHg Patient Gender: M              HR:           75 bpm. Exam Location:  Inpatient Procedure: Transesophageal Echo Indications:    Bacteremia  History:        Patient has prior history of Echocardiogram examinations.                 Signs/Symptoms:Bacteremia.  Sonographer[facility]    [facility] Referring Phys: [place]: The transesophogeal probe was passed without difficulty through the esophogus of the patient. Sedation performed by different physician. The patient  developed no complications during the procedure.  IMPRESSIONS  1. No vegetations or evidence of SBE.  2. Left ventricular ejection fraction, by estimation, is 50 to 55%. The left ventricle has low normal function. There is severe left ventricular hypertrophy.  3. Right ventricular systolic function is normal. The right ventricular size is normal.  4. Left atrial size was moderately dilated. No left atrial/left atrial appendage thrombus was detected.  5. Right atrial size was moderately dilated.  6. The mitral valve is abnormal. Mild mitral valve regurgitation.  7. The aortic valve is tricuspid. There is moderate calcification of the aortic valve. There is moderate thickening of the aortic valve. Aortic valve regurgitation is not visualized. Mild aortic valve stenosis. FINDINGS  Left Ventricle: Left ventricular ejection fraction, by estimation, is 50 to 55%. The left ventricle has low normal function. The left ventricular internal cavity size was normal in size. There is severe left ventricular hypertrophy. Right Ventricle: The right ventricular size is normal. Right vetricular wall thickness was not assessed. Right ventricular systolic function is normal. Left Atrium: Left atrial size was moderately dilated. No left atrial/left atrial appendage thrombus was detected. Right Atrium: Right atrial size was moderately dilated. Pericardium: There is no evidence of pericardial effusion. Mitral Valve: The mitral valve is abnormal. There is mild thickening of the mitral valve leaflet(s). Mild mitral annular calcification. Mild mitral valve regurgitation. Tricuspid Valve: The tricuspid valve is normal in structure. Tricuspid valve regurgitation is mild. Aortic Valve: The aortic valve is tricuspid. There is moderate calcification of the aortic valve. There is moderate thickening of the aortic valve. Aortic valve regurgitation is not visualized. Mild aortic stenosis is present. Aortic valve mean gradient measures 9.0 mmHg.  Aortic valve peak gradient measures 17.1 mmHg. Aortic valve area, by VTI measures 1.29 cm. Pulmonic Valve: The pulmonic valve was normal in structure. Pulmonic valve regurgitation is trivial. Aorta: The aortic root is normal in size and structure. IAS/Shunts: No atrial  level shunt detected by color flow Doppler. Additional Comments: No vegetations or evidence of SBE. LEFT VENTRICLE PLAX 2D LVOT diam:     1.70 cm LV SV:         48 LV SV Index:   32 LVOT Area:     2.27 cm  AORTIC VALVE AV Area (Vmax):    1.45 cm AV Area (Vmean):   1.32 cm AV Area (VTI):     1.29 cm AV Vmax:           207.00 cm/s AV Vmean:          139.000 cm/s AV VTI:            0.376 m AV Peak Grad:      17.1 mmHg AV Mean Grad:      9.0 mmHg LVOT Vmax:         132.00 cm/s LVOT Vmean:        81.000 cm/s LVOT VTI:          0.213 m LVOT/AV VTI ratio: 0.57  SHUNTS Systemic VTI:  0.21 m Systemic Diam: 1.70 cm Jenkins Rouge MD Electronically signed by Jenkins Rouge MD Signature Date/Time: 09/07/2022/12:28:06 PM    Final    VAS US CAROTID  Result Date: 09/07/2022 Carotid Arterial Duplex Study Patient Name:  Albert Harris  Date of Exam:   09/05/2022 Medical Rec #: [id number]       Accession #:    [id number] Date of Birth: 02/14/1956      Patient Gender: M Patient Age:   26 years Exam Location:  [facility] Procedure:      VAS US CAROTID Referring Phys: Alferd Patee [facility] --------------------------------------------------------------------------------  Indications:       CVA. Risk Factors:      Hypertension, hyperlipidemia, past history of smoking, PAD. Other Factors:     ESRD on dialysis. Limitations        Today's exam was limited due to Patient condition.                    Somnolence. and heavy calcification and the resulting                    shadowing. Comparison Study:  Prior carotid duplex done 10/02/19 Performing Technologist: Sharion Dove RVS  Examination Guidelines: A complete evaluation includes B-mode imaging, spectral Doppler, color  Doppler, and power Doppler as needed of all accessible portions of each vessel. Bilateral testing is considered an integral part of a complete examination. Limited examinations for reoccurring indications may be performed as noted.  Right Carotid Findings: +----------+--------+--------+--------+----------------------+--------+           PSV cm/sEDV cm/sStenosisPlaque Description    Comments +----------+--------+--------+--------+----------------------+--------+ CCA Prox  112     17              heterogenous                   +----------+--------+--------+--------+----------------------+--------+ CCA Distal122     14              heterogenous                   +----------+--------+--------+--------+----------------------+--------+ ICA Prox  215     29      40-59%  calcific and irregulartortuous +----------+--------+--------+--------+----------------------+--------+ ICA Mid   126     22              calcific                       +----------+--------+--------+--------+----------------------+--------+  ICA Distal154     26                                             +----------+--------+--------+--------+----------------------+--------+ ECA       47      3                                              +----------+--------+--------+--------+----------------------+--------+ +----------+--------+-------+------------+-------------------+           PSV cm/sEDV cmsDescribe    Arm Pressure (mmHG) +----------+--------+-------+------------+-------------------+ Subclavian               Not assessed                    +----------+--------+-------+------------+-------------------+ +---------+--------+--------+------------+ VertebralPSV cm/sEDV cm/sNot assessed +---------+--------+--------+------------+  Left Carotid Findings: +----------+--------+--------+--------+----------------------+--------+           PSV cm/sEDV cm/sStenosisPlaque Description    Comments  +----------+--------+--------+--------+----------------------+--------+ CCA Prox  99      13              calcific                       +----------+--------+--------+--------+----------------------+--------+ CCA Mid   117     13              calcific                       +----------+--------+--------+--------+----------------------+--------+ CCA Distal111     12              calcific                       +----------+--------+--------+--------+----------------------+--------+ ICA Prox  199     24      40-59%  calcific and irregular         +----------+--------+--------+--------+----------------------+--------+ ICA Mid   194     28                                             +----------+--------+--------+--------+----------------------+--------+ ICA Distal69      11                                             +----------+--------+--------+--------+----------------------+--------+ ECA       271     16              calcific                       +----------+--------+--------+--------+----------------------+--------+ +----------+--------+--------+--------+-------------------+           PSV cm/sEDV cm/sDescribeArm Pressure (mmHG) +----------+--------+--------+--------+-------------------+ [id number]                                         +----------+--------+--------+--------+-------------------+ +---------+--------+--+--------+--+---------+ VertebralPSV cm/s83EDV cm/s16Antegrade +---------+--------+--+--------+--+---------+   Summary: Right Carotid: Velocities in the right ICA are  consistent with a 40-59%                stenosis, no significant change since prior study. Left Carotid: Velocities in the left ICA are consistent with a 40-59% stenosis,               no significant change since prior study. Vertebrals:  Left vertebral artery demonstrates antegrade flow. Subclavians: Normal flow hemodynamics were seen in the left subclavian artery. *See  table(s) above for measurements and observations.  Electronically signed by Antony Contras MD on 09/07/2022 at 8:06:03 AM.    Final    VAS [place] LOWER EXTREMITY ARTERIAL DUPLEX  Result Date: 09/05/2022 LOWER EXTREMITY ARTERIAL DUPLEX STUDY Patient Name:  Albert Harris  Date of Exam:   09/05/2022 Medical Rec #: [id number]       Accession #:    [id number] Date of Birth: 23-Sep-1955      Patient Gender: M Patient Age:   87 years Exam Location[facility]  [facility] Procedure:      VAS [place] LOWER EXTREMITY ARTERIAL DUPLEX Referring Phys: Cornelius Moras Geraldyn Shain --------------------------------------------------------------------------------  Indications: Peripheral artery disease, and Acute pain in lower back and left              leg. Anterior pelvic extraperitoneal hematoma within the prevesical              space. Right > left extension of hemorrhage within the              extraperitoneal lateral pelvic walls by CT today. High Risk Factors: Hypertension, hyperlipidemia, prior CVA. Other Factors: ESRD on dialysis,.  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 Current ABI:            n/a Comparison Study: Prior duplex of right fem-pop bypass done 08/06/22 indicating                   patent bypass Performing Technologist: Sharion Dove RVS  Examination Guidelines: A complete evaluation includes B-mode imaging, spectral Doppler, color Doppler, and power Doppler as needed of all accessible portions of each vessel. Bilateral testing is considered an integral part of a complete examination. Limited examinations for reoccurring indications may be performed as noted.  +----------+--------+-----+---------------+--------+--------+ RIGHT     PSV cm/sRatioStenosis       WaveformComments +----------+--------+-----+---------------+--------+--------+ ATA Mid   169          30-49% stenosis                  +----------+--------+-----+---------------+--------+--------+ ATA Distal147                                          +----------+--------+-----+---------------+--------+--------+ PTA Prox  114                                          +----------+--------+-----+---------------+--------+--------+ PTA Mid   116                                          +----------+--------+-----+---------------+--------+--------+  PTA [id number]          30-49% stenosis                 +----------+--------+-----+---------------+--------+--------+ DP        0                                            +----------+--------+-----+---------------+--------+--------+  Right Graft #1: Fem-Pop +------------------+--------+--------+----------+-----------+                   PSV cm/sStenosisWaveform  Comments    +------------------+--------+--------+----------+-----------+ Inflow            130             monophasic            +------------------+--------+--------+----------+-----------+ Prox Anastomosis  110                       multiphasic +------------------+--------+--------+----------+-----------+ Proximal Graft    99                                    +------------------+--------+--------+----------+-----------+ Mid Graft         88                                    +------------------+--------+--------+----------+-----------+ Distal Graft      86                                    +------------------+--------+--------+----------+-----------+ Distal Anastomosis116                                   +------------------+--------+--------+----------+-----------+ Outflow                                                 +------------------+--------+--------+----------+-----------+   +-----------+--------+-----+--------+-------------------+-----------------+ LEFT       PSV cm/sRatioStenosisWaveform           Comments           +-----------+--------+-----+--------+-------------------+-----------------+ CFA Prox   88                   monophasic                           +-----------+--------+-----+--------+-------------------+-----------------+ DFA        82                   monophasic                           +-----------+--------+-----+--------+-------------------+-----------------+ SFA Prox                occluded                                     +-----------+--------+-----+--------+-------------------+-----------------+ SFA Mid  occluded                                     +-----------+--------+-----+--------+-------------------+-----------------+ SFA Distal                      monophasic         collateral flow   +-----------+--------+-----+--------+-------------------+-----------------+ POP Prox   34                   monophasic                           +-----------+--------+-----+--------+-------------------+-----------------+ POP Distal 33                   monophasic                           +-----------+--------+-----+--------+-------------------+-----------------+ ATA Prox   23                   dampened monophasic                  +-----------+--------+-----+--------+-------------------+-----------------+ ATA Mid    26                   dampened monophasiccollaterals noted +-----------+--------+-----+--------+-------------------+-----------------+ ATA Distal                      dampened monophasic                  +-----------+--------+-----+--------+-------------------+-----------------+ PTA Prox   26                   dampened monophasic                  +-----------+--------+-----+--------+-------------------+-----------------+ PTA Mid    26                   dampened monophasic                  +-----------+--------+-----+--------+-------------------+-----------------+ PTA Distal 28                   dampened  monophasic                  +-----------+--------+-----+--------+-------------------+-----------------+ PERO Prox  20                   dampened monophasic                  +-----------+--------+-----+--------+-------------------+-----------------+ PERO Mid   34                   dampened monophasic                  +-----------+--------+-----+--------+-------------------+-----------------+ PERO Distal29                   dampened monophasiccollateral flow   +-----------+--------+-----+--------+-------------------+-----------------+ DP                              not visualized                       +-----------+--------+-----+--------+-------------------+-----------------+  Summary: Right: No significant change compared to previous study. Patent fem-pop bypass graft. 30-49% stenosis  noted in the mid ATA and distal PTA. Left: Chronically occluded SFA. Monophasic and dampened monophasic flow noted in the popliteal, posterior tibial, anterior tibial, and peroneal arteries. Collateral flow noted in the distal SFA, mid ATA, and distal peroneal.  See table(s) above for measurements and observations. Electronically signed by Harold Barban MD on 09/05/2022 at 9:11:41 PM.    Final    CT ABDOMEN PELVIS WO CONTRAST  Result Date: 09/05/2022 CLINICAL DATA:  Rule out retroperitoneal bleed. EXAM: CT ABDOMEN AND PELVIS WITHOUT CONTRAST TECHNIQUE: Multidetector CT imaging of the abdomen and pelvis was performed following the standard protocol without IV contrast. RADIATION DOSE REDUCTION: This exam was performed according to the departmental dose-optimization program which includes automated exposure control, adjustment of the mA and/or kV according to patient size and/or use of iterative reconstruction technique. COMPARISON:  08/12/2022 FINDINGS: Lower chest: Emphysema. Development of areas of nodular airspace disease within both lower lobes since 08/12/2022. Mild cardiomegaly. Distal right  coronary artery calcification. Hypoattenuation in the intravascular space suggests anemia. Trace left pleural fluid is similar. Hepatobiliary: Normal liver. Normal gallbladder, without biliary ductal dilatation. Pancreas: Soft tissue density within the pancreatic tail of 2.1 cm in 23/3 is most consistent with a splenule, when correlated with contrast enhanced exam. No duct dilatation or acute inflammation. Spleen: Otherwise normal spleen. Adrenals/Urinary Tract: Normal adrenal glands. Marked renal cortical thinning bilaterally. Renal vascular calcifications. No hydronephrosis. The bladder is decompressed, without calcified stone. Stomach/Bowel: Normal stomach, without wall thickening. Scattered colonic diverticula. Normal terminal ileum and appendix. Normal small bowel. Vascular/Lymphatic: Advanced aortic and branch vessel atherosclerosis. Bilateral iliac stent grafts. No abdominopelvic adenopathy. Reproductive: Normal prostate. Other: No free intraperitoneal air. No intraperitoneal fluid identified. Hemorrhage within the anterior extraperitoneal pelvic prevesical space at maximally 10.3 x 6.3 cm on 35/3. This is contiguous with right pelvic extraperitoneal hematoma at 4.7 x 3.3 cm on 71/3. Increase in left pelvic complex fluid which is also presumably hemorrhage on 68/3. There is adjacent gas as well as gas and fluid in the left groin, new on 75/3-likely iatrogenic. Musculoskeletal: Posterolateral right ninth rib nonacute but incompletely healed fracture. Transitional S1 vertebral body. IMPRESSION: 1. Anterior pelvic extraperitoneal hematoma within the prevesical space. Right-greater-than-left extension of hemorrhage within the extraperitoneal lateral pelvic walls. 2. Trace left pleural fluid. 3. Development of nodular airspace disease since 08/12/2022, favoring infection or aspiration. 4. Coronary artery atherosclerosis. Aortic Atherosclerosis (ICD10-I70.0). Emphysema (ICD10-J43.9). These results will be called to  the ordering clinician or representative by the Radiologist Assistant, and communication documented in the PACS or [employer]. Electronically Signed   By: Abigail Miyamoto M.D.   On: 09/05/2022 13:25   MR CERVICAL SPINE WO CONTRAST  Result Date: 09/05/2022 CLINICAL DATA:  Myelopathy, acute, cervical spine. EXAM: MRI CERVICAL SPINE WITHOUT CONTRAST TECHNIQUE: Multiplanar, multisequence MR imaging of the cervical spine was performed. No intravenous contrast was administered. COMPARISON:  Radiographs 05/11/2022, CT 08/07/2021 and MRI of the cervical spine 06/25/2022. FINDINGS: Despite efforts by the technologist and patient, mild to moderate motion artifact is present on today's exam and could not be eliminated. This reduces exam sensitivity and specificity. Alignment: Straightening without focal angulation or listhesis. Vertebrae: Interval improvement in the previously demonstrated abnormalities of the C5 and C6 vertebral bodies and intervening C5-6 disc. There is less marrow edema and less surrounding inflammatory change. No new disc space findings or acute osseous abnormalities. Cord: Stable cord flattening and possible T2 hyperintensity in the cord at the C3-4 and C5-6 levels. No evidence of cord  hemorrhage. Posterior Fossa, vertebral arteries, paraspinal tissues: Intracranial findings are dictated separatelybilateral vertebral artery flow voids. The prevertebral inflammatory changes seen previously have significantly improved in the interval. No paraspinal fluid collection identified. Oval T2 hyperintensity within the left mandible was not previously imaged and could reflect osteomyelitis. Disc levels: C2-3: No significant findings. C3-4: Stable spondylosis with loss of disc height and posterior osteophytes covering diffusely bulging disc material. There is uncinate spurring and facet hypertrophy bilaterally contributing to stable mild cord flattening and foraminal narrowing bilaterally. There is possible  mild cord T2 hyperintensity, grossly stable. C4-5: Mild spondylosis with mild disc bulging and facet hypertrophy. No cord deformity or high-grade foraminal narrowing. C5-6: Chronic spondylosis with loss of disc height and posterior osteophytes covering diffusely bulging disc material. The AP diameter of the canal is narrowed to approximately 5 mm with unchanged chronic cord compression. Moderate foraminal narrowing bilaterally. As above, the previously demonstrated paraspinal inflammatory changes and marrow edema at this level have improved. C6-7: Stable spondylosis with posterior osteophytes covering diffusely bulging disc material. Mild spinal stenosis and mild foraminal narrowing bilaterally. C7-T1: Stable mild disc bulging and mild uncinate spurring bilaterally. No cord deformity. Mild foraminal narrowing bilaterally. T1-2: Normal interspace. IMPRESSION: 1. Compared with previous MRI from 06/25/2022, there has been significant improvement in the previously demonstrated paraspinal inflammatory changes and marrow edema at the C5-6 level, suggesting improving discitis/osteomyelitis. Correlate clinically. No residual paraspinal fluid collection identified. 2. Similar multilevel cervical spondylosis, most advanced at C5-6 where there is chronic cord compression and moderate foraminal narrowing bilaterally. Possible mild cord T2 hyperintensity at C3-4 and C5-6. 3. T2 hyperintensity in the left body of the mandible, not previously imaged and potentially reflecting osteomyelitis. Correlate clinically. 4. No acute findings identified. Electronically Signed   By: Richardean Sale M.D.   On: 09/05/2022 12:37   MR ANGIO HEAD WO CONTRAST  Result Date: 09/05/2022 CLINICAL DATA:  Acute cervical myelopathy EXAM: MRA HEAD WITHOUT CONTRAST TECHNIQUE: Angiographic images of the Circle of Willis were acquired using MRA technique without intravenous contrast. COMPARISON:  Brain MRI from yesterday FINDINGS: Pervasive motion  artifact. The left vertebral artery is strongly dominant with thready flow in the right vertebral artery accentuated by motion. No branch occlusion, beading, or aneurysm. No flow limiting stenosis. IMPRESSION: Motion degraded MRA without acute finding. Irregular appearance of the right vertebral artery partially related to motion. Electronically Signed   By: Jorje Guild M.D.   On: 09/05/2022 11:54   MR LUMBAR SPINE WO CONTRAST  Result Date: 09/04/2022 CLINICAL DATA:  LLE EXAM: MRI LUMBAR SPINE WITHOUT CONTRAST TECHNIQUE: Multiplanar, multisequence MR imaging of the lumbar spine was performed. No intravenous contrast was administered. COMPARISON:  None Available. FINDINGS: Limited due to motion/artifact. Segmentation: Standard segmentation is assumed. The inferior-most fully formed intervertebral disc is labeled L5-S1. Alignment:  No substantial sagittal subluxation. Vertebrae: Heterogeneous bone marrow. No focal edema to suggest acute fracture or discitis/osteomyelitis. No focal suspicious bone lesion. Conus medullaris and cauda equina: Conus extends to the superior L2 level. Conus appears normal. Paraspinal and other soft tissues: Limited visualization without obvious acute abnormality. Disc levels: T12-L1: No significant disc protrusion, foraminal stenosis, or canal stenosis. L1-L2: No significant disc protrusion, foraminal stenosis, or canal stenosis. L2-L3: No significant disc protrusion, foraminal stenosis, or canal stenosis. L3-L4: Mild disc bulging and facet arthropathy. No significant stenosis. L4-L5: No significant disc protrusion, foraminal stenosis, or canal stenosis. L5-S1: Facet arthropathy. Small left paracentral disc protrusion which comes in close proximity to the exiting left  L5 nerve without impingement. No significant stenosis. IMPRESSION: 1. Motion limited study without evidence of significant canal or foraminal stenosis. 2. At L5-S1, small left paracentral disc protrusion which comes in  close proximity to the exiting left L5 nerve without impingement. Electronically Signed   By: Margaretha Sheffield M.D.   On: 09/04/2022 12:19   MR BRAIN WO CONTRAST  Result Date: 09/04/2022 CLINICAL DATA:  Provided history: Altered mental status, left lower extremity weakness. EXAM: MRI HEAD WITHOUT CONTRAST TECHNIQUE: Multiplanar, multiecho pulse sequences of the brain and surrounding structures were obtained without intravenous contrast. COMPARISON:  Head CT 08/30/2022. FINDINGS: Intermittently motion degraded examination, limiting evaluation. Most notably, the sagittal T1 sequence is moderately motion degraded and the least motion degraded axial T2 sequence is moderately motion degraded. Within this limitation, findings are as follows. Brain: Moderate generalized cerebral atrophy. Mild cerebellar atrophy. 4 mm acute infarct within the right subinsular white matter (series 2, images 22 and 23) (series 3, image 19). Redemonstrated chronic lacunar infarct within the right basal ganglia. Background multifocal T2 FLAIR hyperintense signal abnormality within the cerebral white matter, nonspecific but compatible with mild chronic small vessel ischemic disease. These findings are similar to the prior brain MRI of 05/09/2021. As before, there are a few chronic microhemorrhages scattered within the supratentorial brain. Redemonstrated tiny chronic lacunar infarct within the central pons, and small chronic infarcts within the left cerebellar hemisphere. No evidence of an intracranial mass. No extra-axial fluid collection. No midline shift. Vascular: As before, there is signal abnormality within the intracranial right vertebral artery suggesting high-grade stenosis or vessel occlusion. Skull and upper cervical spine: No focal suspicious marrow lesion. Incompletely assessed cervical spondylosis. Sinuses/Orbits: No mass or acute finding within the imaged orbits. No more than trace paranasal sinus mucosal thickening.  IMpression #2 will be called to the ordering clinician or representative by the Radiologist Assistant, and communication documented in the PACS or [employer]. IMPRESSION: 1. Motion degraded examination. 2. 4 mm acute infarct within the right subinsular white matter. 3. Parenchymal atrophy, chronic small vessel ischemic disease and chronic infarcts as described. 4. Few chronic microhemorrhages within the supratentorial brain. 5. Chronic signal abnormality within the intracranial right vertebral artery suggesting high-grade stenosis or vessel occlusion. Electronically Signed   By: Kellie Simmering D.O.   On: 09/04/2022 12:01   VAS [place] LOWER EXTREMITY SAPHENOUS VEIN MAPPING  Result Date: 08/31/2022 LOWER EXTREMITY VEIN MAPPING Patient Name:  Albert Harris  Date of Exam:   08/31/2022 Medical Rec #: [id number]       Accession #:    [id number] Date of Birth: 04/16/1956      Patient Gender: M Patient Age:   36 years Exam Location:  [facility] Procedure:      VAS [place] LOWER EXTREMITY SAPHENOUS VEIN MAPPING Referring Phys: Vonna Kotyk ROBINS --------------------------------------------------------------------------------  Indications:  Pre-op Risk Factors: PAD.  Limitations: Right Fem-pop bypass graft with saphenous vein 09/05/21 Comparison Study: Prior vein mapping done 09/18/20 Performing Technologist: Sharion Dove RVS  Examination Guidelines: A complete evaluation includes B-mode imaging, spectral Doppler, color Doppler, and power Doppler as needed of all accessible portions of each vessel. Bilateral testing is considered an integral part of a complete examination. Limited examinations for reoccurring indications may be performed as noted. +----------+--------------+-----------------------+----------------+-----------+     RT     RT Findings            GSV          LT Diameter (cm)LT Findings  Diameter                                                                     (  cm)                                                                     +----------+--------------+-----------------------+----------------+-----------+             Harvested   Saphenofemoral Junction      0.67                  +----------+--------------+-----------------------+----------------+-----------+             Harvested       Proximal thigh           0.48                  +----------+--------------+-----------------------+----------------+-----------+             Harvested          Mid thigh             0.40                  +----------+--------------+-----------------------+----------------+-----------+             Harvested        Distal thigh         0.44/0.31     branching  +----------+--------------+-----------------------+----------------+-----------+           not visualized         Knee                0.22                  +----------+--------------+-----------------------+----------------+-----------+           not visualized       Prox calf             0.21                  +----------+--------------+-----------------------+----------------+-----------+           not visualized       Mid calf            0.24/1.8     branching  +----------+--------------+-----------------------+----------------+-----------+           not visualized      Distal calf         0.360/.09     branching  +----------+--------------+-----------------------+----------------+-----------+           not visualized         Ankle            0.15/0.12     branching  +----------+--------------+-----------------------+----------------+-----------+ Diagnosing physician: [name] MD Electronically signed by [name] MD on 08/31/2022 at 6:30:54 PM.    Final    VAS [place] (AVF, AVG)  Result Date: 08/31/2022 DIALYSIS ACCESS Patient Name:  Albert Harris  Date of Exam:   08/31/2022 Medical Rec #: [id number]       Accession #:    [id number] Date of Birth: 1955-12-27       Patient Gender: M Patient Age:   24 years Exam Location:  [facility] Procedure:      VAS US DUPLEX DIALYSIS ACCESS (AVF, AVG) Referring Phys: Santiago Bumpers --------------------------------------------------------------------------------  Reason for Exam: No palpable thrill for AVF/AVG. Access Site: Right Upper Extremity. Access Type: Forearm loop  AVG. Comparison Study: No prior study Performing Technologist: Sharion Dove RVS  Examination Guidelines: A complete evaluation includes B-mode imaging, spectral Doppler, color Doppler, and power Doppler as needed of all accessible portions of each vessel. Unilateral testing is considered an integral part of a complete examination. Limited examinations for reoccurring indications may be performed as noted.  Findings:   Thrombosed AV graft  Summary: Thrombosed right AV graft  *See table(s) above for measurements and observations.  Diagnosing physician: [name] MD Electronically signed by [name] MD on 08/31/2022 at 6:30:35 PM.    --------------------------------------------------------------------------------   Final    ECHOCARDIOGRAM LIMITED  Result Date: 08/31/2022    ECHOCARDIOGRAM LIMITED REPORT   Patient Name:   Albert Harris Date of Exam: 08/31/2022 Medical Rec #:  [id number]      Height:       68.0 in Accession #:    [id number]     Weight:       144.4 lb Date of Birth:  [birth year]     BSA:          1.780 m Patient Age:    2 years       BP:           165/52 mmHg Patient Gender: M              HR:           79 bpm. Exam Location:  Inpatient Procedure: Limited Echo, Cardiac Doppler and Limited Color Doppler Indications:    Bacteremia R78.81  History:        Patient has prior history of Echocardiogram examinations, most                 recent 06/29/2022. 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  Sonographer:    Greer Pickerel Referring Phys: Toney Sang SOOD  Sonographer Comments: Image acquisition challenging due to respiratory motion. IMPRESSIONS  1. Left ventricular ejection fraction, by estimation, is 45%. There is moderate to severe concentric left ventricular hypertrophy. Diastolic function was not assessed.  2. Right ventricular systolic function is normal.  3. The mitral valve is grossly normal with mild thickening of the mitral valve leaflets. Mild mitral valve regurgitation.  4. Right atrial size was mildly dilated.  5. Left atrial size was mildly dilated.  6. The inferior vena cava is normal in size with greater than 50% respiratory variability, suggesting right atrial pressure of 3 mmHg.  7. The aortic valve was not well visualized. There is mild calcification of the aortic valve. There is mild thickening of the aortic valve. Aortic valve regurgitation is not visualized. FINDINGS  Left Ventricle: Left ventricular ejection fraction, by estimation, is 45 to 50%. The left ventricle has mildly decreased function. There is severe concentric left ventricular hypertrophy. Left ventricular diastolic function could not be evaluated. Right Ventricle: No increase in right ventricular wall thickness. Right ventricular systolic function is normal. Left Atrium: Left atrial size was mildly dilated. Right Atrium: Right atrial size was mildly dilated. Pericardium: There is no evidence of pericardial effusion. Mitral Valve: The mitral valve is grossly normal. There is mild thickening of the mitral valve leaflet(s). Mild mitral annular calcification. Mild mitral valve regurgitation. Tricuspid Valve: The  tricuspid valve is normal in structure. Tricuspid valve regurgitation is mild. Aortic Valve: The aortic valve was not well visualized. There is mild calcification of the aortic valve. There is mild thickening of the aortic valve. Aortic valve regurgitation is not visualized. Pulmonic Valve: The  pulmonic valve was not assessed. Venous: The inferior vena cava is normal in size with greater than 50% respiratory variability, suggesting right atrial pressure of 3 mmHg. Additional Comments: Spectral Doppler performed. Color Doppler performed.  LEFT VENTRICLE PLAX 2D LVIDd:         4.00 cm LVIDs:         3.00 cm LV PW:         1.60 cm LV IVS:        1.10 cm  LEFT ATRIUM         Index LA diam:    2.80 cm 1.57 cm/m  MITRAL VALVE MV Area (PHT): 2.73 cm MV Decel Time: 278 msec MV E velocity: 72.80 cm/s MV A velocity: 99.00 cm/s MV E/A ratio:  0.74 Aditya Sabharwal Electronically signed by Hebert Soho Signature Date/Time: 08/31/2022/2:22:32 PM    Final    CT HEAD WO CONTRAST (5MM)  Result Date: 08/30/2022 CLINICAL DATA:  Initial evaluation for mental status change. EXAM: CT HEAD WITHOUT CONTRAST TECHNIQUE: Contiguous axial images were obtained from the base of the skull through the vertex without intravenous contrast. RADIATION DOSE REDUCTION: This exam was performed according to the departmental dose-optimization program which includes automated exposure control, adjustment of the mA and/or kV according to patient size and/or use of iterative reconstruction technique. COMPARISON:  Prior CT from 08/07/2021. FINDINGS: Brain: Age-related cerebral atrophy with mild chronic small vessel ischemic disease. Chronic right basal ganglia lacunar infarct. No acute intracranial hemorrhage. No acute large vessel territory infarct. No mass lesion or midline shift. No hydrocephalus or extra-axial fluid collection. Vascular: No abnormal hyperdense vessel. Scattered calcified atherosclerosis about the skull base, stable. Skull: Scalp soft tissues and calvarium demonstrate no acute finding. Sinuses/Orbits: Globes and orbital soft tissues within normal limits. Paranasal sinuses are clear. Trace right mastoid effusion noted, of doubtful significance. Other: None. IMPRESSION: 1. No acute intracranial abnormality. 2. Age-related  cerebral atrophy with mild chronic small vessel ischemic disease, with small remote right basal ganglia lacunar infarct, stable. Electronically Signed   By: Jeannine Boga M.D.   On: 08/30/2022 19:21   DG Abd Portable 1V  Result Date: 08/29/2022 CLINICAL DATA:  Orogastric tube placement EXAM: PORTABLE ABDOMEN - 1 VIEW COMPARISON:  Portable exam 1228 hours compared to 1040 hours FINDINGS: Tip of nasogastric tube projects over distal gastric antrum/pylorus. Lung bases clear. Nonobstructive bowel gas pattern. IMPRESSION: Tip of nasogastric tube projects over distal gastric antrum/pylorus. Electronically Signed   By: Lavonia Dana M.D.   On: 08/29/2022 12:45   DG Abd Portable 1V  Result Date: 08/29/2022 CLINICAL DATA:  Orogastric tube placement EXAM: PORTABLE ABDOMEN - 1 VIEW COMPARISON:  08/12/2022 CT abdomen FINDINGS: The orogastric tube distal tip is in the stomach body with side port in the gastric fundus. Borderline enlargement of the cardiopericardial silhouette. Formed stool in the visualized colon. Mild lumbar spondylosis. Enlargement of the cardiopericardial silhouette IMPRESSION: 1. Orogastric tube tip is in the stomach body with side port in the gastric fundus. 2. Borderline enlargement of the cardiopericardial silhouette. Electronically Signed   By: Van Clines M.D.   On: 08/29/2022 10:52   DG Chest Portable 1 View  Result Date: 08/28/2022 CLINICAL DATA:  Central line placement EXAM:  PORTABLE CHEST 1 VIEW COMPARISON:  08/12/2022 FINDINGS: Right IJ line with tip at the upper SVC. Artifact from EKG leads. Nipple shadows asymmetric to the left, confirmed on recent abdominal CT covering the lung bases. There is no edema, consolidation, effusion, or pneumothorax. Normal heart size. IMPRESSION: Right IJ line without complicating feature. Electronically Signed   By: Jorje Guild M.D.   On: 08/28/2022 21:41   CT Angio Aortobifemoral W and/or Wo Contrast  Result Date: 08/28/2022 CLINICAL  DATA:  Claudication or leg ischemia EXAM: CT ANGIOGRAPHY OF ABDOMINAL AORTA WITH ILIOFEMORAL RUNOFF TECHNIQUE: Multidetector CT imaging of the abdomen, pelvis and lower extremities was performed using the standard protocol during bolus administration of intravenous contrast. Multiplanar CT image reconstructions and MIPs were obtained to evaluate the vascular anatomy. RADIATION DOSE REDUCTION: This exam was performed according to the departmental dose-optimization program which includes automated exposure control, adjustment of the mA and/or kV according to patient size and/or use of iterative reconstruction technique. CONTRAST:  80mL OMNIPAQUE IOHEXOL 350 MG/ML SOLN COMPARISON:  CT 08/12/2022 and previous FINDINGS: VASCULAR Aorta: Extensive partially calcified atheromatous plaque throughout. No aneurysm, dissection, or stenosis. 4 mm penetrating atheromatous ulcer along the left posterolateral wall in the infrarenal segment. Celiac: Calcified ostial plaque resulting in short segment stenosis of moderate severity, atheromatous but patent distally. SMA: Significant partially calcified atheromatous plaque extending from the ostium approximally 2.4 cm, with progression to at least moderately severe stenosis since 01/01/2022, with continued patency distally with classic distal branch anatomy. Renals: Single left, with near occlusive ostial plaque extending over length of at least 1.2 cm, patent but atheromatous distally. Single right, with heavily calcified atheromatous plaque through its length , of likely hemodynamic significance. IMA: Atheromatous but patent. RIGHT Lower Extremity Inflow: Common iliac proximal stent patent, distal stent with mild residual/recurrent narrowing at its proximal end, patent across the bifurcation. Internal iliac occluded at its origin External iliac overlapping stents through nearly its length, with moderate residual/recurrent stenosis at the distal margin at the common femoral artery.  Outflow: Common femoral is mildly atheromatous but patent distally. Deep femoral branches patent proximally, segmentally occluded distally. SFA proximal occlusion extending throughz its length. Patent fem-pop graft, with some retrograde filling of the proximal native popliteal artery. Runoff: Venous opacification in the proximal calf limited evaluation of proximal tibial runoff. Scattered atheromatous calcifications and poor enhancement in the distal calf limiting assessment of distal trifurcation runoff. LEFT Lower Extremity Inflow: Common iliac patent stents through its length Internal iliac origin occlusion External iliac patent stent through its length. Outflow: Common femoral has 1.1 cm thin-necked pseudoaneurysm from its proximal segment, patent distally. Deep femoral branches patent SFA origin occlusion obtaining throughout its length despite presence of stents. Popliteal is reconstituted proximally by collaterals, patent but moderately atheromatous through its distal length. Runoff: Atheromatous but apparently contiguous three-vessel tibial runoff to above the ankle, with extensive vascular calcifications and limited enhancement limiting assessment across the ankle and into the foot. Veins: No obvious venous abnormality within the limitations of this arterial phase study. Review of the MIP images confirms the above findings. NON-VASCULAR Lower chest: No pleural or pericardial effusion. Visualized lung bases clear. Hepatobiliary: No focal liver abnormality is seen. No gallstones, gallbladder wall thickening, or biliary dilatation. Pancreas: Unremarkable. No pancreatic ductal dilatation or surrounding inflammatory changes. Spleen: Normal in size without focal abnormality. 2 cm probable splenule abutting the pancreatic tail, stable since earliest available scan 06/18/2020. Adrenals/Urinary Tract: No adrenal mass. Marked bilateral renal parenchymal atrophy with extensive renal  arterial branch calcifications. No  hydronephrosis or mass. Urinary bladder is nondistended. Stomach/Bowel: Stomach is partially distended by gas and fluid, no acute findings. Small bowel is decompressed. Normal appendix. The colon is incompletely distended, with scattered diverticula at the descending/sigmoid junction, no adjacent inflammatory changes. Lymphatic: No abdominal or pelvic adenopathy. Reproductive: Mild prostate enlargement. Other: Subcutaneous gas bubbles overlying left common femoral vessels. There is a deep subcutaneous hematoma overlying the left common femoral vessels with extension into the anterior extraperitoneal space of the pelvis, dominant component measuring 11.6 x 6.3 cm,, and an 8.3 cm right-sided component extending along the pelvic sidewall. No active extravasation is identified. No ascites or hemoperitoneum. No free air. Left pelvic phleboliths. Musculoskeletal: No acute findings. IMPRESSION: 1. 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 2. New anterior pelvic extraperitoneal hematoma measuring up to 11.6 cm diameter. No active extravasation evident. 3. Long segment LEFT SFA occlusion. Critical Value/emergent results were discussed by telephone at the time of interpretation on 08/28/2022 at 7:35 pm with provider [facility] , who verbally acknowledged these results. 4. Residual/recurrent stenosis at the distal margin of the RIGHT external iliac stent. 5. Patent RIGHT fem-pop graft 6. Patent LEFT iliac stents. 7. 4 mm penetrating atheromatous ulcer in the infrarenal abdominal aorta. 8. Colonic diverticulosis. 9. Aortic Atherosclerosis (ICD10-I70.0). Electronically Signed   By: Lucrezia Europe M.D.   On: 08/28/2022 19:40   HYBRID OR IMAGING ([facility] ONLY)  Result Date: 08/22/2022 There is no interpretation for this exam.  This order is for images obtained during a surgical procedure.  Please See "Surgeries" Tab for more information regarding the procedure.   CT ABDOMEN PELVIS W CONTRAST  Result  Date: 08/12/2022 CLINICAL DATA:  Abdominal pain, nausea, vomiting, diarrhea, recent diagnosis of C. difficile EXAM: CT ABDOMEN AND PELVIS WITH CONTRAST TECHNIQUE: Multidetector CT imaging of the abdomen and pelvis was performed using the standard protocol following bolus administration of intravenous contrast. RADIATION DOSE REDUCTION: This exam was performed according to the departmental dose-optimization program which includes automated exposure control, adjustment of the mA and/or kV according to patient size and/or use of iterative reconstruction technique. CONTRAST:  62mL OMNIPAQUE IOHEXOL 350 MG/ML SOLN COMPARISON:  01/01/2022 FINDINGS: Lower chest: Cardiomegaly.  Trace bilateral pleural effusions. Hepatobiliary: No focal liver abnormality is seen. Hyperattenuating fluid within the gallbladder suggesting vicarious excretion of contrast. No pericholecystic inflammatory changes by CT. No biliary dilatation. Pancreas: Splenule is again noted abutting the distal pancreatic tail. Pancreas appears otherwise unremarkable. No pancreatic ductal dilatation or surrounding inflammatory changes. Spleen: Normal in size without focal abnormality. Adrenals/Urinary Tract: Adrenal glands are unremarkable. Kidneys are normal, without renal calculi, focal lesion, or hydronephrosis. Bladder is decompressed. Stomach/Bowel: Stomach within normal limits. No dilated loops of bowel. Long segment mild circumferential wall thickening of the sigmoid colon, and to a lesser degree involving the descending colon. There are numerous left-sided colonic diverticula, although no focally inflamed diverticulum is seen. A normal appendix is seen in the right lower quadrant. Vascular/Lymphatic: Severe aortoiliac atherosclerosis without aneurysm. No abdominopelvic lymphadenopathy. Reproductive: Prostate is unremarkable. Other: Trace perihepatic ascites. Small volume retroperitoneal fluid within the anterolateral aspect of the left hemipelvis measuring  greater than fluid density. Approximate measurements of 6.5 x 1.8 x 5.1 cm (volume = 31 cm^3). No pneumoperitoneum. Musculoskeletal: No acute or significant osseous findings. IMPRESSION: 1. Small volume retroperitoneal fluid within the left hemipelvis measuring greater than fluid density, suggestive of a retroperitoneal hematoma of indeterminate etiology. 2. Long segment mild circumferential wall thickening  of the sigmoid colon, and to a lesser degree involving the descending colon. Findings are suggestive of an infectious or inflammatory colitis. 3. Trace perihepatic ascites. 4. Trace bilateral pleural effusions. 5. Severe aortoiliac atherosclerosis (ICD10-I70.0). Electronically Signed   By: Davina Poke D.O.   On: 08/12/2022 16:41   DG Chest Port 1 View  Result Date: 08/12/2022 CLINICAL DATA:  Shortness of breath EXAM: PORTABLE CHEST 1 VIEW COMPARISON:  CXR 07/21/22 FINDINGS: No pleural effusion. No pneumothorax. Unchanged cardiomegaly. Unchanged mediastinal contours. Focal airspace opacity. Redemonstrated are prominent bilateral interstitial opacities, which could represent pulmonary venous congestion or pulmonary edema. No radiographically apparent displaced rib fractures. Visualized upper abdomen is unremarkable. IMPRESSION: 1. Unchanged cardiomegaly with prominent bilateral interstitial opacities, which could represent pulmonary venous congestion or pulmonary edema. 2. No focal airspace opacity. Electronically Signed   By: Marin Roberts M.D.   On: 08/12/2022 14:41     PHYSICAL EXAM  Temp:  [97.6 F (36.4 C)-98.3 F (36.8 C)] 97.7 F (36.5 C) (04/04 0756) Pulse Rate:  [58-79] 61 (04/04 0800) Resp:  [11-42] 14 (04/04 0855) BP: (82-141)/(48-71) 138/63 (04/04 0800) SpO2:  [93 %-100 %] 99 % (04/04 0855) FiO2 (%):  [21 %] 21 % (04/04 0134) Weight:  [71.9 kg] 71.9 kg (04/04 0437)  General - Well nourished, well developed, in no apparent distress.  Ophthalmologic - fundi not visualized due to  noncooperation.  Cardiovascular - Regular rhythm and rate.  Mental Status -  Level of arousal and orientation to time, place, and person were intact. Language including expression, naming, repetition, comprehension was assessed and found intact. Fund of Knowledge was assessed and was intact.  Cranial Nerves II - XII - II - Visual field intact OU. III, IV, VI - Extraocular movements intact. V - Facial sensation intact bilaterally. VII - Facial movement intact bilaterally. VIII - Hearing & vestibular intact bilaterally. X - Palate elevates symmetrically. XI - Chin turning & shoulder shrug intact bilaterally. XII - Tongue protrusion intact.  Motor Strength - [name] strength was normal in b/l upper extremities and pronator drift was absent.  LLE 1/5 proximal and distal, and RLE 4/5 proximal and distally. Bulk was normal and fasciculations were absent.   Motor Tone - Muscle tone was assessed at the neck and appendages and was normal.  Reflexes - [name] reflexes were 1+ decreased in all extremities and he had no pathological reflexes.  Sensory - Light touch, temperature/pinprick were assessed and were symmetrical. However, pain at left ankle and calf area. Popliteal fossa tenderness resolved.   Coordination - The patient had normal movements in the hands with no ataxia or dysmetria.  Tremor was absent.  Gait and Station - deferred.   ASSESSMENT/PLAN Albert Harris is a 67 y.o. male with history of ESRD on HD, PAD, HTN, HLD admitted for abdominal pain. Found to have left femoral psudoaneurysm s/p repair. Also found RUE AVG occlusion s/p failed attempt of thrombectomy. Found confusion and left leg weakness, MRI showed incidental lacunar infarct.    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 Long segment LEFT SFA occlusion. Residual/recurrent stenosis at the distal margin of the RIGHT external iliac  stent. Patent RIGHT fem-pop graft. Patent LEFT iliac stents. S/p left femoral artery pseudoaneurysm repair 3/22 Reported b/l leg weakness, more significant at left Intense pain with passive movement of left LE at calf and popliteal possa MRI L-spine unremarkable MRI C-spine chronic  C5-6 chord compression and signal change MRI T-spine not done, pt refusal Concerning for bilateral leg vasculopathy, L>R LE arterial doppler Left chronically occluded SFA. Monophasic and dampened monophasic flow noted in the popliteal, posterior tibial, anterior tibial, and peroneal arteries.  Right patent fem-pop bypass graft. 30-49% stenosis noted in the mid ATA and distal PTA  CTA LEs 4/3 Thrombosis of LEFT CFA pseudoaneurysm with new 3.5 cm filling defect, consistent with arterial thrombus, extending from the PSA neck and into the distal CFA. This is at risk for distal embolization and potential acute LEFT lower extremity ischemia. New 8 mm distal LEFT EIA pseudoaneurysm, medial to the stent distal landing zone. 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 Consider antiplatelet therapy once appropriate.  Ongoing aggressive stroke risk  factor management Therapy recommendations:  CIR Disposition:  pending  Retroperitoneal hemorrhage 3/22 CTA anterior pelvic extraperitoneal hematoma measuring up to 11.6 cm diameter. No active extravasation evident. 3/30 CT abd Anterior pelvic extraperitoneal hematoma within the prevesical space. Right-greater-than-left extension of hemorrhage within the extraperitoneal lateral pelvic walls. VVS on board CTA abd/pelvis Mild interval enlargement of retroperitoneal hematoma, in a craniocaudal dimension measuring 12.2 cm (previously 11.2 cm).  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 Long segment LEFT SFA occlusion. Residual/recurrent stenosis at the distal margin of the RIGHT external iliac stent. Patent RIGHT fem-pop graft. Patent LEFT iliac stents. S/p left femoral artery pseudoaneurysm repair 3/22 S/p AVG thrombectomy attempt but patient became hemodynamically unstable and procedure was aborted.  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  B Cx + for MRSA likely due to LLE post op infection -> wound worsening -> washout in am B Cx neg 3/27 Leukocytosis WBC 22.3->19.4->19.9->19.8->29.0->16.6 ID on board TEE no endocarditis GPC in pairs on gram stain on culture obtained in OR intraoperatively 4/3 On vancomycin and zosyn  Acute anemia Back pain Pt complaining of lower back pain 3/30 Hb  7.5->7.8->9.2->8.1->9.1->7.5->7.0->PRBC->9.2->10.0->8.0 S/p left femoral artery pseudoaneurysm 3/22 3/30 CT abd Anterior pelvic extraperitoneal hematoma within the prevesical space. Right-greater-than-left extension of hemorrhage within the extraperitoneal lateral pelvic walls. 4/3 CTA abd/pelvis Mild interval enlargement of retroperitoneal hematoma, in a craniocaudal dimension measuring 12.2 cm (previously 11.2 cm). CBC monitoring Heparin IV on hold   Hx of  hypertension Transient hypotension 3/30 Stable now S/p IVF bolus and PRBC 3/30 - responded well Long term BP goal normotensive  Hyperlipidemia Home meds:  crestor 10  LDL 45, goal < 70 Resume crestor 10 No high intensity statin given LDL at goal Continue statin on discharge  Other Stroke Risk Factors Advanced age Former Cigarette smoker ESRD on HD with hyperkalemia K 4.9->5.5  Other Active Problems Hyponatremia - Na 131->133->134->136  Hospital day # 13  Neurology will sign off. Please call with questions. Pt will follow up with stroke clinic [facility] at [facility] in about 4 weeks after discharge. Thanks for the consult.  Rosalin Hawking, MD PhD Stroke Neurology 09/10/2022 11:09 AM    To contact Stroke Continuity provider, please refer to [email or web address] After hours, contact General Neurology

## 2022-09-11 DIAGNOSIS — E872 Acidosis, unspecified: Secondary | ICD-10-CM | POA: Diagnosis not present

## 2022-09-11 LAB — BPAM RBC
Blood Product Expiration Date: 202404122359
Blood Product Expiration Date: 202404272359
Blood Product Expiration Date: 202404272359
Blood Product Expiration Date: 202404282359
Blood Product Expiration Date: 202404282359
ISSUE DATE / TIME: 202404031251
ISSUE DATE / TIME: 202404031251
ISSUE DATE / TIME: 202404031452
ISSUE DATE / TIME: 202404040548
ISSUE DATE / TIME: 202404050400
Unit Type and Rh: 5100
Unit Type and Rh: 5100
Unit Type and Rh: 5100
Unit Type and Rh: 5100
Unit Type and Rh: 5100

## 2022-09-11 LAB — RENAL FUNCTION PANEL
Albumin: 1.9 g/dL — ABNORMAL LOW (ref 3.5–5.0)
Anion gap: 16 — ABNORMAL HIGH (ref 5–15)
BUN: 37 mg/dL — ABNORMAL HIGH (ref 8–23)
CO2: 23 mmol/L (ref 22–32)
Calcium: 7.8 mg/dL — ABNORMAL LOW (ref 8.9–10.3)
Chloride: 97 mmol/L — ABNORMAL LOW (ref 98–111)
Creatinine, Ser: 4.04 mg/dL — ABNORMAL HIGH (ref 0.61–1.24)
GFR, Estimated: 15 mL/min — ABNORMAL LOW (ref >60)
Glucose, Bld: 89 mg/dL (ref 70–99)
Phosphorus: 5 mg/dL — ABNORMAL HIGH (ref 2.5–4.6)
Potassium: 3.6 mmol/L (ref 3.5–5.1)
Sodium: 136 mmol/L (ref 135–145)

## 2022-09-11 LAB — TYPE AND SCREEN
ABO/RH(D): O POS
Antibody Screen: NEGATIVE
Unit division: 0
Unit division: 0
Unit division: 0
Unit division: 0
Unit division: 0

## 2022-09-11 LAB — BASIC METABOLIC PANEL
Anion gap: 10 (ref 5–15)
Anion gap: 12 (ref 5–15)
BUN: 36 mg/dL — ABNORMAL HIGH (ref 8–23)
BUN: 52 mg/dL — ABNORMAL HIGH (ref 8–23)
CO2: 23 mmol/L (ref 22–32)
CO2: 24 mmol/L (ref 22–32)
Calcium: 7.1 mg/dL — ABNORMAL LOW (ref 8.9–10.3)
Calcium: 7.6 mg/dL — ABNORMAL LOW (ref 8.9–10.3)
Chloride: 102 mmol/L (ref 98–111)
Chloride: 98 mmol/L (ref 98–111)
Creatinine, Ser: 3.54 mg/dL — ABNORMAL HIGH (ref 0.61–1.24)
Creatinine, Ser: 4.75 mg/dL — ABNORMAL HIGH (ref 0.61–1.24)
GFR, Estimated: 18 mL/min — ABNORMAL LOW (ref >60)
GFR, Estimated: 13 mL/min — ABNORMAL LOW (ref >60)
Glucose, Bld: 88 mg/dL (ref 70–99)
Glucose, Bld: 111 mg/dL — ABNORMAL HIGH (ref 70–99)
Potassium: 3.2 mmol/L — ABNORMAL LOW (ref 3.5–5.1)
Potassium: 3.9 mmol/L (ref 3.5–5.1)
Sodium: 135 mmol/L (ref 135–145)
Sodium: 134 mmol/L — ABNORMAL LOW (ref 135–145)

## 2022-09-11 LAB — GLUCOSE, CAPILLARY: Glucose-Capillary: 111 mg/dL — ABNORMAL HIGH (ref 70–99)

## 2022-09-11 LAB — CBC
HCT: 20.1 % — ABNORMAL LOW (ref 39.0–52.0)
HCT: 24.9 % — ABNORMAL LOW (ref 39.0–52.0)
Hemoglobin: 6.5 g/dL — CL (ref 13.0–17.0)
Hemoglobin: 8.3 g/dL — ABNORMAL LOW (ref 13.0–17.0)
MCH: 31.3 pg (ref 26.0–34.0)
MCH: 31.1 pg (ref 26.0–34.0)
MCHC: 32.3 g/dL (ref 30.0–36.0)
MCHC: 33.3 g/dL (ref 30.0–36.0)
MCV: 96.6 fL (ref 80.0–100.0)
MCV: 93.3 fL (ref 80.0–100.0)
Platelets: 158 10*3/uL (ref 150–400)
Platelets: 177 10*3/uL (ref 150–400)
RBC: 2.08 MIL/uL — ABNORMAL LOW (ref 4.22–5.81)
RBC: 2.67 MIL/uL — ABNORMAL LOW (ref 4.22–5.81)
RDW: 17.9 % — ABNORMAL HIGH (ref 11.5–15.5)
RDW: 18.3 % — ABNORMAL HIGH (ref 11.5–15.5)
WBC: 15.3 10*3/uL — ABNORMAL HIGH (ref 4.0–10.5)
WBC: 16.7 10*3/uL — ABNORMAL HIGH (ref 4.0–10.5)
nRBC: 0 % (ref 0.0–0.2)
nRBC: 0 % (ref 0.0–0.2)

## 2022-09-11 LAB — HEMOGLOBIN AND HEMATOCRIT, BLOOD
HCT: 26.6 % — ABNORMAL LOW (ref 39.0–52.0)
HCT: 24.6 % — ABNORMAL LOW (ref 39.0–52.0)
Hemoglobin: 8.6 g/dL — ABNORMAL LOW (ref 13.0–17.0)
Hemoglobin: 8 g/dL — ABNORMAL LOW (ref 13.0–17.0)

## 2022-09-11 LAB — C-REACTIVE PROTEIN: CRP: 11.9 mg/dL — ABNORMAL HIGH (ref <1.0)

## 2022-09-11 LAB — PREPARE RBC (CROSSMATCH)

## 2022-09-11 MED ORDER — SODIUM CHLORIDE 0.9 % IV SOLN
600.0000 mg | Freq: Once | INTRAVENOUS | Status: AC
  Administered 2022-09-11: 600 mg via INTRAVENOUS
  Filled 2022-09-11: qty 12

## 2022-09-11 MED ORDER — CHLORHEXIDINE GLUCONATE CLOTH 2 % EX PADS
6.0000 | MEDICATED_PAD | Freq: Every day | CUTANEOUS | Status: DC
  Administered 2022-09-12 – 2022-09-22 (×8): 6 via TOPICAL

## 2022-09-11 MED ORDER — CALCITRIOL 0.25 MCG PO CAPS
0.2500 ug | ORAL_CAPSULE | ORAL | Status: DC
  Administered 2022-09-15 – 2022-09-19 (×4): 0.25 ug via ORAL
  Filled 2022-09-11 (×5): qty 1

## 2022-09-11 MED ORDER — NEPRO/CARBSTEADY PO LIQD
237.0000 mL | Freq: Three times a day (TID) | ORAL | Status: DC
  Administered 2022-09-12: 237 mL via ORAL

## 2022-09-11 MED ORDER — CALCIUM ACETATE (PHOS BINDER) 667 MG PO CAPS
1334.0000 mg | ORAL_CAPSULE | Freq: Three times a day (TID) | ORAL | Status: DC
  Administered 2022-09-11 – 2022-09-23 (×25): 1334 mg via ORAL
  Filled 2022-09-11 (×38): qty 2

## 2022-09-11 MED ORDER — SODIUM CHLORIDE 0.9% IV SOLUTION: Freq: Once | INTRAVENOUS | Status: AC

## 2022-09-11 MED ORDER — PROSOURCE PLUS PO LIQD
30.0000 mL | Freq: Three times a day (TID) | ORAL | Status: DC
  Administered 2022-09-11 – 2022-09-23 (×20): 30 mL via ORAL
  Filled 2022-09-11 (×24): qty 30

## 2022-09-11 NOTE — Progress Notes (Signed)
PROGRESS NOTE Albert Harris  UJW:[id number] DOB: 1955-09-15 DOA: 08/28/2022 PCP: [name], NP  Brief Narrative/Hospital Course: 67 year old male with history of ESRD on HD TTS, PVD,COPD, essential hypertension, chronic systolic congestive heart failure with ejection fraction 35 to 40%, presented with abdominal pain nausea and diaphoresis.Patient was also hypotensive with a lactic acidosis and was admitted to ICU,with a pulseless left lower extremity was found to have femoral pseudoaneurysm.Culture came back with MRSA bacteremia.  08/29/22:S/p OR with Dr. Karin Lieu for left femoral exploration with pseudoaneurysm repair,hematoma evacuation>showed dehiscence at proximal aspect of bovine pericardial patch at the suture line.  Patient had required pressor for septic shock.  Due to severe hyperkalemia, patient was had a emergent CRRT. 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 09/08/22 Transferred to [facility] service > overnight bleeding from the groin area dressing changes. 09/09/22 CTA: Thrombosis of LEFT CFA pseudoaneurysm with new 3.5 cm filling defect, consistent with arterial thrombus, extending from the PSA neck and into the distal CFA. This is at risk for distal embolization and potential acute LEFT lower extremity ischemia.New 8 mm distal LEFT EIA pseudoaneurysm, medial to the stent distal landing zone. Mild interval enlargement of retroperitoneal hematoma, in a craniocaudal dimension measuring 12.2 cm (previously 11.2 cm). No CTA evidence of active extravasation. Patent RIGHT femoral-to-popliteal graft and bilateral iliac stents.Severe burden of splanchnic arterial atherosclerosis, at risk for chronic mesenteric ischemia. 09/09/22: S/P left common iliac to profunda femoris bypass with  ipsilateral nonreversed greater saphenous vein in anatomic tunnel, left common iliac artery angioplasty and stenting, left greater saphenous vein harvest, left sartorius muscle of left groin, left lower extremity angiography with first-order cannulation   Subjective: Patient was seen and examined Daughter at bedside Overnight afebrile Labs showed hemoglobin down trended 6.4 g 1 unit PRBC ordered Drain output was 245 cc yesterday Recheck hb up in 8s.  Assessment and Plan:  Septic shock with MRSA bacteremia-Initially admitted to ICU on vasopressors>shock resolved Left Groin wound infection Right groin hematoma with a left common femoral artery pseudoaneurysm S/P Urgent OR 09/09/22 for left groin bleeding> s/p left common iliac to profunda femoris bypass with ipsilateral nonreversed greater saphenous vein in anatomic tunnel, left common iliac artery angioplasty and stenting, left greater saphenous vein harvest, left sartorius muscle of left groin, left lower extremity angiography with first-order cannulation: 09/08/22 Transferred to [facility] service > overnight bleeding from the groin area dressing changes>CTA 4/3 came back later w/ Thrombosis of LEFT CFA pseudoaneurysm with new 3.5 cm filling defect, consistent with arterial thrombus, extending from the PSA neck and into the distal CFA.This is at risk for distal embolization and potential acute LEFT lower extremity ischemia.New 8 mm distal LEFT EIA pseudoaneurysm, medial to the stent distal landing zone. Mild interval enlargement of retroperitoneal hematoma, in a craniocaudal dimension measuring 12.2 cm (previously 11.2 cm). No CTA evidence of active extravasation.Patent RIGHT femoral-to-popliteal graft and bilateral iliac stents. Severe burden of splanchnic arterial atherosclerosis, at risk for chronic mesenteric ischemia. Patient underwent operative intervention as above 09/09/22 e>OR culture 4/3 Staph aureus and E Fecium> ID following Zosyn discontinued,  continue vancomycin pending sensitivity may need to switch to daptomycin for VRE. Continue with wound VAC dressing as per vascular.  Patient at high risk for left leg amputation.   PVD with history of left external  iliac artery and common artery stenting and right femoropopliteal bypass with left common femoral artery pseudoaneurysm on CT 3/22, status post left femoral artery pseudoaneurysm repair 3/22, S/P AVG thrombectomy attempted but patient became hemodynamically unstable and procedure was aborted, followed by vascular  s/p OR with surgery as above on 09/09/22.see above.  Retroperitoneal hemorrhage on CTA 3/22, on 3/30 CT abdomen intrapelvic asked retroperitoneal hematoma within the prevesical space, right greater than left extension of hemorrhage within the extraperitoneal and lateral pelvic walls. CT 09/09/22: Mild interval enlargement of retroperitoneal hematoma, in a craniocaudal dimension measuring 12.2 cm (previously 11.2 cm).No CTA evidence of active extravasation Monitor. Plan as per   Acute respiratory failure with hypoxemia.   COPD: Cont IS, prn inhalers  Chronic CHF with preserved EF-based on echo from 4/1 Pulmonary hypertension: TEE 4/1 showed EF 50-55%,severe LVH, no left atrial thrombus detected.  Fluid being addressed with dialysis-per nephrology.   ESRD on HD TTS: nephrology o/b.AVG clotted on 3/27 and temp catheter removed 3/28 IR placed new TDC on 4/1.  S/P line holiday.Cont HD per nephrology-following Hyponatremia/hypokalemia/hyperkalemia/ metabolic acidosis: resolved Recent Labs  Lab 09/07/22 0434 09/08/22 0329 09/09/22 0411 09/09/22 1209 09/10/22 0038 09/10/22 0627 09/10/22 2024 09/11/22 0306 09/11/22 0441  K 4.5 4.0 4.2   < > 5.5* 6.0* 3.2* 3.2* 3.6  CALCIUM 8.3* 8.3* 8.3*   < > 7.5* 7.8* 7.5* 7.1* 7.8*  MG 2.4  --   --   --   --   --   --   --   --   PHOS 7.8* 4.4 6.3*  --   --  9.0*  --   --  5.0*   < > = values in this interval not displayed.    Acute blood  loss anemia Anemia of chronic kidney disease: Patient needing multiple blood transfusions in the setting of operative intervention, retroperitoneal bleed pseudoaneurysm/left groin bleeding/with wound VAC w/ drain in place.  Will check serial H&H and transfuse as needed. 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  Neurology recommending antiplatelet therapy once okay with vascular, but currently having ABLA so on hold. C ont on  Crestor.  HLD: Continue Crestor  History of hypertension with transient hypotension on 3/30 was managed with IV fluid bolus, not needing antihypertensives.  Poor oral intake: Dietitian following if intake does not pick up >plan for core track placement on 4/5 Moderate protein calorie malnutrition augment diet as tolerated  Nutrition Problem: Moderate Malnutrition (in the context of chronic illness (ESRD)) Etiology: poor appetite Signs/Symptoms: moderate fat depletion, severe muscle depletion Interventions: Refer to RD note for recommendations, Liberalize Diet, Nepro shake, MVI   Pressure injury of skin on coccyx stage II POA, wound care   DVT prophylaxis: heparin injection 5,000 Units Start: 09/10/22 0600 SCD's Start: 09/09/22 1841 Code Status:   Code Status: Full Code Family Communication: plan of care discussed with patient at bedside.  Patient's daughter updated at the bedside Patient status is: Patient because of ongoing need for IV antibiotics and bleeding and pain Level of care: ICU  Dispo: The patient is from: home lives  with wife            Anticipated disposition:TBD Objective: Vitals last 24 hrs: Vitals:   09/11/22 1008 09/11/22 1030 09/11/22 1100 09/11/22 1130  BP: (!) 145/78 (!) 109/58 (!) 128/58 137/76  Pulse: 76 69 66 (!) 222  Resp: 18 13 15 14   Temp:      TempSrc:      SpO2: 99% 98% 98% (!) 66%  Weight:      Height:       Weight change: 8.9 kg  Physical Examination: General exam: AA O X.3,weak,older appearing HEENT:Oral mucosa moist, Ear/Nose WNL grossly, dentition normal. Respiratory system: bilaterally clear BS, no use of accessory muscle Cardiovascular system: S1 & S2 +, regular rate. Gastrointestinal system: Abdomen soft,NT,ND,BS+ Nervous System:Alert, awake, moving extremities and grossly nonfocal Extremities: LLE weak- able to feel sensation, unable to move left leg, left leg warmer today Left groin wound VAC dressing in place. Skin:No rashes,no icterus. ZOX:WRUEAVSK:Normal muscle bulk,tone, power  TDC+  Medications reviewed:  Scheduled Meds:  (feeding supplement) PROSource Plus  30 mL Oral BID BM   sodium chloride   Intravenous Once   sodium chloride   Intravenous Once   acetaminophen  650 mg Oral Q6H   aspirin EC  81 mg Oral Q0600   Chlorhexidine Gluconate Cloth  6 each Topical Q0600   Chlorhexidine Gluconate Cloth  6 each Topical Q0600   Chlorhexidine Gluconate Cloth  6 each Topical Q0600   darbepoetin (ARANESP) injection - DIALYSIS  100 mcg Subcutaneous Q Wed-1800   feeding supplement (NEPRO CARB STEADY)  237 mL Oral BID BM   heparin injection (subcutaneous)  5,000 Units Subcutaneous Q8H   HYDROmorphone   Intravenous Q4H   methocarbamol  500 mg Oral TID   multivitamin  1 tablet Oral QHS   pantoprazole  40 mg Oral Daily   rosuvastatin  10 mg Oral Daily   sodium chloride flush  10-40 mL Intracatheter Q12H   tetracaine  100 mg Infiltration Once   vancomycin variable dose per unstable renal function (pharmacist dosing)   Does not apply See admin instructions  Continuous  Infusions:  sodium chloride Stopped (09/08/22 0904)   sodium chloride 0 mL (09/05/22 1840)   sodium chloride     sodium chloride Stopped (09/10/22 1727)   magnesium sulfate bolus IVPB     Diet Order             Diet regular Room service appropriate? Yes; Fluid consistency: Thin; Fluid restriction: 1200 mL Fluid  Diet effective 0500                   Intake/Output Summary (Last 24 hours) at 09/11/2022 1256 Last data filed at 09/11/2022 0800 Gross per 24 hour  Intake 1056.51 ml  Output 500 ml  Net 556.51 ml   Net IO Since Admission: 6,790.53 mL [09/11/22 1256]  Wt Readings from Last 3 Encounters:  09/11/22 71.8 kg  08/23/22 64.1 kg  08/15/22 64.9 kg     Unresulted Labs (From admission, onward)     Start     Ordered   09/11/22 2100  Hemoglobin and hematocrit, blood  Now then every 6 hours,   R (with TIMED occurrences)     Question:  Specimen collection method  Answer:  Lab=Lab collect   09/11/22 0827   09/11/22 0950  Hemoglobin and hematocrit, blood  Now then every 6 hours,   R (with TIMED occurrences)  Question:  Specimen collection method  Answer:  Lab=Lab collect   09/11/22 0827   09/10/22 1500  Basic metabolic panel  2 times daily,   R (with TIMED occurrences)     Question:  Specimen collection method  Answer:  Lab=Lab collect   09/10/22 0736   09/10/22 0737  CBC  2 times daily,   R (with TIMED occurrences)     Question:  Specimen collection method  Answer:  Lab=Lab collect   09/10/22 0736   09/10/22 0500  CBC  Daily,   R     Question:  Specimen collection method  Answer:  Lab=Lab collect   09/09/22 1411   08/29/22 0500  Renal function panel (daily at 0500)  Daily,   R      08/28/22 2020          Data Reviewed: I have personally reviewed following labs and imaging studies CBC: Recent Labs  Lab 09/09/22 1820 09/10/22 0038 09/10/22 0627 09/10/22 1451 09/11/22 0306 09/11/22 1005  WBC 23.1* 19.1* 16.6* 14.0* 15.3*  --   HGB 9.7* 9.1* 8.0* 7.6* 6.5* 8.6*   HCT 29.4* 27.3* 24.9* 22.6* 20.1* 26.6*  MCV 94.5 93.8 95.4 93.4 96.6  --   PLT 205 148* 179 177 158  --    Basic Metabolic Panel: Recent Labs  Lab 09/07/22 0434 09/08/22 0329 09/09/22 0411 09/09/22 1209 09/10/22 0038 09/10/22 0627 09/10/22 2024 09/11/22 0306 09/11/22 0441  NA 129* 134* 136   < > 132* 136 135 135 136  K 4.5 4.0 4.2   < > 5.5* 6.0* 3.2* 3.2* 3.6  CL 90* 95* 98   < > 99 98 96* 102 97*  CO2 19* 23 23   < > 18* 21* 26 23 23   GLUCOSE 98 235* 120*   < > 120* 112* 100* 88 89  BUN 100* 43* 80*   < > 69* 72* 32* 36* 37*  CREATININE 8.70* 4.77* 6.51*   < > 5.60* 5.91* 3.27* 3.54* 4.04*  CALCIUM 8.3* 8.3* 8.3*   < > 7.5* 7.8* 7.5* 7.1* 7.8*  MG 2.4  --   --   --   --   --   --   --   --   PHOS 7.8* 4.4 6.3*  --   --  9.0*  --   --  5.0*   < > = values in this interval not displayed.   GFR: Estimated Creatinine Clearance: 16.9 mL/min (A) (by C-G formula based on SCr of 4.04 mg/dL (H)). Liver Function Tests: Recent Labs  Lab 09/07/22 0434 09/08/22 0329 09/09/22 0411 09/10/22 0627 09/11/22 0441  ALBUMIN 1.7* 2.1* 1.9* 2.2* 1.9*  CBG: Recent Labs  Lab 09/09/22 0808  GLUCAP 114*   No results for input(s): "PROCALCITON", "LATICACIDVEN" in the last 168 hours.   Recent Results (from the past 240 hour(s))  Aerobic/Anaerobic Culture w Gram Stain (surgical/deep wound)     Status: None   Collection Time: 09/02/22  2:06 PM   Specimen: Wound  Result Value Ref Range Status   Specimen Description WOUND  Final   Special Requests AV GRAFT THROMBUS  Final   Gram Stain NO WBC SEEN NO ORGANISMS SEEN   Final   Culture   Final    No growth aerobically or anaerobically. Performed at [facility], [address], [place], [place] [address]    Report Status 09/08/2022 FINAL  Final  Culture, blood (Routine X 2) w Reflex to ID Panel  Status: None   Collection Time: 09/02/22  4:44 PM   Specimen: BLOOD LEFT HAND  Result Value Ref Range Status   Specimen  Description BLOOD LEFT HAND  Final   Special Requests   Final    BOTTLES DRAWN AEROBIC AND ANAEROBIC Blood Culture results may not be optimal due to an inadequate volume of blood received in culture bottles   Culture   Final    NO GROWTH 5 DAYS Performed at [facility], [address] [place], [place] [address]    Report Status 09/07/2022 FINAL  Final  Culture, blood (Routine X 2) w Reflex to ID Panel     Status: None   Collection Time: 09/02/22  4:45 PM   Specimen: BLOOD LEFT HAND  Result Value Ref Range Status   Specimen Description BLOOD LEFT HAND  Final   Special Requests   Final    BOTTLES DRAWN AEROBIC AND ANAEROBIC Blood Culture adequate volume   Culture   Final    NO GROWTH 5 DAYS Performed at [facility], [address] [place], [place] [address]    Report Status 09/07/2022 FINAL  Final  Surgical pcr screen     Status: None   Collection Time: 09/09/22 11:16 AM   Specimen: Nasal Mucosa; Nasal Swab  Result Value Ref Range Status   MRSA, PCR NEGATIVE NEGATIVE Final   Staphylococcus aureus NEGATIVE NEGATIVE Final    Comment: (NOTE) The Xpert SA Assay (FDA approved for NASAL specimens in patients 56 years of age and older), is one component of a comprehensive surveillance program. It is not intended to diagnose infection nor to guide or monitor treatment. Performed at [facility], [address], [place], [place] [address]   Aerobic/Anaerobic Culture w Gram Stain (surgical/deep wound)     Status: None (Preliminary result)   Collection Time: 09/09/22  1:28 PM   Specimen: Groin, Left; Wound  Result Value Ref Range Status   Specimen Description GROIN LEFT  Final   Special Requests LEFT GROIN HEMATOMA PT ON VANC ANCEF  Final   Gram Stain   Final    ABUNDANT WBC PRESENT, PREDOMINANTLY PMN RARE GRAM POSITIVE COCCI IN PAIRS    Culture   Final    CULTURE REINCUBATED FOR BETTER GROWTH Performed at [facility], [address], [place], [place]  [address]    Report Status PENDING  Incomplete  Aerobic/Anaerobic Culture w Gram Stain (surgical/deep wound)     Status: None (Preliminary result)   Collection Time: 09/09/22  1:28 PM   Specimen: Groin, Left; Wound  Result Value Ref Range Status   Specimen Description TISSUE LEFT GROIN  Final   Special Requests PT ON VANC ANCEF LEFT HEMATOMA  Final   Gram Stain   Final    ABUNDANT WBC PRESENT, PREDOMINANTLY PMN RARE GRAM POSITIVE COCCI IN PAIRS    Culture   Final    RARE STAPHYLOCOCCUS AUREUS RARE ENTEROCOCCUS FAECIUM SUSCEPTIBILITIES TO FOLLOW Performed at [facility], [address] [place], [place] [address]    Report Status PENDING  Incomplete  Aerobic/Anaerobic Culture w Gram Stain (surgical/deep wound)     Status: None (Preliminary result)   Collection Time: 09/09/22  2:48 PM   Specimen: PATH Other; Tissue  Result Value Ref Range Status   Specimen Description WOUND  Final   Special Requests LEFT COMMON ILIAC STENTS PT ON VANC ANCEF  Final   Gram Stain   Final  ABUNDANT WBC PRESENT,BOTH PMN AND MONONUCLEAR FEW GRAM POSITIVE COCCI IN PAIRS Performed at [facility], [address] [place], [place] [address]    Culture   Final    ABUNDANT METHICILLIN RESISTANT STAPHYLOCOCCUS AUREUS   Report Status PENDING  Incomplete   Organism ID, Bacteria METHICILLIN RESISTANT STAPHYLOCOCCUS AUREUS  Final      Susceptibility   Methicillin resistant staphylococcus aureus - MIC*    CIPROFLOXACIN 1 SENSITIVE Sensitive     ERYTHROMYCIN >=8 RESISTANT Resistant     GENTAMICIN <=0.5 SENSITIVE Sensitive     OXACILLIN >=4 RESISTANT Resistant     TETRACYCLINE <=1 SENSITIVE Sensitive     VANCOMYCIN 1 SENSITIVE Sensitive     TRIMETH/SULFA <=10 SENSITIVE Sensitive     CLINDAMYCIN <=0.25 SENSITIVE Sensitive     RIFAMPIN <=0.5 SENSITIVE Sensitive     Inducible Clindamycin NEGATIVE Sensitive     * ABUNDANT METHICILLIN RESISTANT STAPHYLOCOCCUS AUREUS  Aerobic/Anaerobic Culture w Gram  Stain (surgical/deep wound)     Status: None (Preliminary result)   Collection Time: 09/09/22  2:53 PM   Specimen: PATH Vessel; Tissue  Result Value Ref Range Status   Specimen Description TISSUE  Final   Special Requests VESSEL PT ON VANC ANCEF  Final   Gram Stain NO WBC SEEN RARE GRAM POSITIVE COCCI   Final   Culture   Final    RARE STAPHYLOCOCCUS AUREUS SUSCEPTIBILITIES TO FOLLOW Performed at [facility], [address] [place], [place] [address]    Report Status PENDING  Incomplete    Antimicrobials: Anti-infectives (From admission, onward)    Start     Dose/Rate Route Frequency Ordered Stop   09/10/22 1200  vancomycin (VANCOREADY) IVPB 500 mg/100 mL  Status:  Discontinued        500 mg 100 mL/hr over 60 Minutes Intravenous Every T-Th-Sa (Hemodialysis) 09/09/22 0719 09/10/22 0719   09/10/22 0815  vancomycin (VANCOREADY) IVPB 750 mg/150 mL        750 mg 150 mL/hr over 60 Minutes Intravenous  Once 09/10/22 0719 09/10/22 0939   09/10/22 0718  vancomycin variable dose per unstable renal function (pharmacist dosing)         Does not apply See admin instructions 09/10/22 0719     09/09/22 2000  piperacillin-tazobactam (ZOSYN) IVPB 2.25 g  Status:  Discontinued        2.25 g 100 mL/hr over 30 Minutes Intravenous Every 8 hours 09/09/22 1849 09/10/22 1003   09/09/22 1145  ceFAZolin (ANCEF) 2-4 GM/100ML-% IVPB       Note to Pharmacy: Payton EmeraldHudson, Leigh A: cabinet override      09/09/22 1145 09/09/22 2359   09/08/22 0830  vancomycin (VANCOREADY) IVPB 750 mg/150 mL        750 mg 150 mL/hr over 60 Minutes Intravenous  Once 09/08/22 0743 09/08/22 1004   09/07/22 1200  ceFAZolin (ANCEF) IVPB 2g/100 mL premix       Note to Pharmacy: Give in IR for surgical prophylaxis   2 g 200 mL/hr over 30 Minutes Intravenous  Once 09/07/22 1100 09/07/22 1327   09/07/22 1101  ceFAZolin (ANCEF) 2-4 GM/100ML-% IVPB       Note to Pharmacy: Phebe CollaHOMPSON, LUISA N: cabinet override      09/07/22 1101  09/07/22 1335   09/07/22 0734  vancomycin variable dose per unstable renal function (pharmacist dosing)  Status:  Discontinued         Does not apply See admin instructions 09/07/22 0734 09/09/22  1610   09/05/22 1600  vancomycin (VANCOREADY) IVPB 750 mg/150 mL        750 mg 150 mL/hr over 60 Minutes Intravenous  Once 09/05/22 1505 09/05/22 1717   09/02/22 1227  ceFAZolin (ANCEF) 2-4 GM/100ML-% IVPB       Note to Pharmacy: Isabel Caprice, Destiny: cabinet override      09/02/22 1227 09/03/22 0044   09/02/22 0520  vancomycin variable dose per unstable renal function (pharmacist dosing)  Status:  Discontinued         Does not apply See admin instructions 09/02/22 0521 09/05/22 0940   08/31/22 1800  vancomycin (VANCOREADY) IVPB 750 mg/150 mL  Status:  Discontinued        750 mg 150 mL/hr over 60 Minutes Intravenous Every 24 hours 08/31/22 0822 09/02/22 0521   08/30/22 1800  vancomycin (VANCOREADY) IVPB 1500 mg/300 mL        1,500 mg 150 mL/hr over 120 Minutes Intravenous STAT 08/30/22 1739 08/30/22 2139      Culture/Microbiology    Component Value Date/Time   SDES TISSUE 09/09/2022 1453   SPECREQUEST VESSEL PT ON VANC ANCEF 09/09/2022 1453   CULT  09/09/2022 1453    RARE STAPHYLOCOCCUS AUREUS SUSCEPTIBILITIES TO FOLLOW Performed at [facility], [address], [place], [place] [address]    REPTSTATUS PENDING 09/09/2022 1453  Other culture-see note  Radiology Studies: DG OR LOCAL ABDOMEN  Result Date: 09/09/2022 CLINICAL DATA:  Rule out any unintentional retained foreign bodies. EXAM: OR LOCAL ABDOMEN COMPARISON:  September 05, 2022 FINDINGS: Post left common iliac to common femoral bypass. Left femoral line seen. Aortobifemoral stent graft. No radiopaque foreign bodies are seen on this single image. Soft tissue edema noted. IMPRESSION: No radiopaque foreign bodies seen. Electronically Signed   By: Ted Mcalpine M.D.   On: 09/09/2022 17:01     LOS: 14 days   Lanae Boast, MD [facility]  09/11/2022, 12:56 PM

## 2022-09-11 NOTE — Progress Notes (Signed)
Pharmacy Antibiotic Note  Albert Harris is a 67 y.o. 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 Now with MRSA bacteremia. Noted patient is ESRD HD TTS PTA, with a right arm AV graft and a catheter in his right IJ. Pharmacy consulted to dose daptomycin due to enterococcus faecium and staph aureus growing from tissue cultures from 4/3  Plan: Daptomycin 600mg  IV x 1 dose today Will follow HD schedule for further dosing  Height: 5\' 8"  (172.7 cm) Weight: 71.8 kg (158 lb 4.6 oz) IBW/kg (Calculated) : 68.4  Temp (24hrs), Avg:97.9 F (36.6 C), Min:97.7 F (36.5 C), Max:98.5 F (36.9 C)  Recent Labs  Lab 09/05/22 1405 09/05/22 1919 09/09/22 1820 09/10/22 0038 09/10/22 0627 09/10/22 1451 09/10/22 2024 09/11/22 0306 09/11/22 0441  WBC 19.7*   < > 23.1* 19.1* 16.6* 14.0*  --  15.3*  --   CREATININE  --    < > 5.20* 5.60* 5.91*  --  3.27* 3.54* 4.04*  VANCORANDOM 14  --   --   --   --   --   --   --   --    < > = values in this interval not displayed.     Estimated Creatinine Clearance: 16.9 mL/min (A) (by C-G formula based on SCr of 4.04 mg/dL (H)).    Allergies  Allergen Reactions   Benadryl [Diphenhydramine] Rash   Lidocaine-Prilocaine Rash    Caused red marks up and down arm   Antimicrobials this admission:  Cefazolin 3/23 x 1 Vancomycin 3/24 >> 4/5 Zosyn 4/3 >>4/3 Daptomycin 4/5>>  Microbiology results:  4/3 L groin wound: staph aureus, enterococcus faecium sensitivity pending 4/3 MRSA PCR: neg 3/27 Bcx: neg 3/25 Bcx: MRSA 3/23 Bcx: MRSA   Celedonio Miyamoto, PharmD, BCIDP Clinical Pharmacist Phone [phone]  Please check AMION for all [facility] Pharmacy numbers 09/11/2022

## 2022-09-11 NOTE — Progress Notes (Signed)
[facility] Progress Note  Subjective: pt seen in ICU. WBC is dropping w/ IV abx  Vitals:   09/11/22 1008 09/11/22 1030 09/11/22 1100 09/11/22 1130  BP: (!) 145/78 (!) 109/58 (!) 128/58 137/76  Pulse: 76 69 66 (!) 222  Resp: 18 13 15 14   Temp:      TempSrc:      SpO2: 99% 98% 98% (!) 66%  Weight:      Height:        Exam: Constitutional: lying in bed, alert and pleasant CV: normal rate, no edema Respiratory: bilateral chest rise, no iwob Gastrointestinal: soft, nonended Psych: mood and affect appropriate  Access: Right upper extremity AVG, no bruit or thrill  L fem wound covered w/ dressing    OP HD: TTS SW  4h   350/800  64.5kg  RFA AVG= clotted/ new TDC in place   Heparin none  - last OP HD 3/14, post wt 65.9kg - venofer 50mg  IV weekly - rocaltrol 0.25 mcg po tiw - phoslo 2 ac tid - no esa, last Hb 11.0  Summary: presented 08/21/22 w/ acute pain/ ischemic L foot. Hx of L EIA stenting on 3/04, and prior RLE bypass. Pt underwent endarterectomy of the left common femoral, profundofemoral, superficial femoral and external iliac vessels with bovine pericardial patch angioplasty and a left iliofemoral and profundofemoral thromboembolectomy on 3/15. Was in ICU until 3/18 for hypotension. DC'd home on 3/18. Pt then returned on 3/22 to ED w/ N/V and unable to feel his L foot. BP's low in ED and was found to have L fem pseudoaneurysm. Had emergent CRRT for high K+. Then taken to OR 3/23 for primary repair of the distal EIA w/ hematoma evacuation. Extubated 3/24. BCx's from  admit were + for MRSA, IV vanc was started. On 3/26 was off CRRT and off pressors. R arm AVG was clotted and went for declot on 3/27 per VVS but graft re-clotted. Pt had regular HD on 3/28 then temp cath was removed for line holiday. IR placed new TDC on 4/01 and pt had regular HD later the same day. Otherwise as below.    Assessment/ Plan: Left common femoral artery pseudoaneurysm - sp repair of L EIA  on 3/23. Went back to OR 4/3 for L CIA to profunda bypass, L CIA pta and stenting, and muscle flap (sartorious to groin).  ESRD: TTS HD.  SP CRRT 3/23- 3/26. Had temp cath for 1 iHD then removed for line holiday. On 4/1 VVS placed TDC. Has had HD x 3 this week. Next HD Sat on schedule.  Hyperkalemia - resolved Volume - mild edema, up 6 kg by wts yest but unable to pull fluid due to hypotension.  CVA: +stroke on MRI. Neuro signed off.  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 ID has since signed off.  Anemia: Hb 8-10 range. Likely multifactorial.  No IV iron given bacteremia. ESA ordered as darbe 100 mcg sq weekly on Wed.  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 Cont po vdra. Not getting any binders - will resume phoslo 2 ac tid as binder.  Dialysis access: AVG clotted 3/27, went for declot by VVS and then AVG re-clotted. Temp cath removed 3/28. [facility] new RIJ [facility]   Vinson Moselleob Shakea Isip MD [facility] 09/11/2022, 1:02 PM  Recent Labs  Lab 09/10/22 [phone] 09/10/22  1451 09/11/22 0306 09/11/22 0441 09/11/22 1005  HGB 8.0*   < > 6.5*  --  8.6*  ALBUMIN 2.2*  --   --  1.9*  --   CALCIUM 7.8*   < > 7.1* 7.8*  --   PHOS 9.0*  --   --  5.0*  --   CREATININE 5.91*   < > 3.54* 4.04*  --   K 6.0*   < > 3.2* 3.6  --    < > = values in this interval not displayed.    No results for input(s): "IRON", "TIBC", "FERRITIN" in the last 168 hours. Inpatient medications:  (feeding supplement) PROSource Plus  30 mL Oral BID BM   sodium chloride   Intravenous Once   sodium chloride   Intravenous Once   acetaminophen  650 mg Oral Q6H   aspirin EC  81 mg Oral Q0600   Chlorhexidine Gluconate Cloth  6 each Topical Q0600   Chlorhexidine Gluconate Cloth  6 each Topical Q0600   Chlorhexidine Gluconate Cloth  6 each Topical Q0600   darbepoetin (ARANESP) injection - DIALYSIS  100 mcg  Subcutaneous Q Wed-1800   feeding supplement (NEPRO CARB STEADY)  237 mL Oral BID BM   heparin injection (subcutaneous)  5,000 Units Subcutaneous Q8H   HYDROmorphone   Intravenous Q4H   methocarbamol  500 mg Oral TID   multivitamin  1 tablet Oral QHS   pantoprazole  40 mg Oral Daily   rosuvastatin  10 mg Oral Daily   sodium chloride flush  10-40 mL Intracatheter Q12H   tetracaine  100 mg Infiltration Once   vancomycin variable dose per unstable renal function (pharmacist dosing)   Does not apply See admin instructions    sodium chloride Stopped (09/08/22 0904)   sodium chloride 0 mL (09/05/22 1840)   sodium chloride     sodium chloride Stopped (09/10/22 1727)   magnesium sulfate bolus IVPB     Place/Maintain arterial line **AND** sodium chloride, sodium chloride, alteplase, diphenhydrAMINE **OR** diphenhydrAMINE, docusate sodium, heparin, hydrALAZINE, magnesium sulfate bolus IVPB, naloxone **AND** sodium chloride flush, ondansetron (ZOFRAN) IV, ondansetron (ZOFRAN) IV, mouth rinse, oxyCODONE, pentafluoroprop-tetrafluoroeth, polyethylene glycol, sodium chloride flush

## 2022-09-11 NOTE — Progress Notes (Signed)
Inpatient Rehabilitation Admissions Coordinator   We will sign off at this time. Noted SNF search in process.  Ottie Glazier, RN, MSN Rehab Admissions Coordinator [phone] 09/11/2022 2:30 PM

## 2022-09-11 NOTE — TOC Initial Note (Signed)
Transition of Care [facility]) - Initial/Assessment Note    Patient Details  Name: Albert Harris MRN: [id number] Date of Birth: 11-14-1955  [facility] CM/SW Contact:    Elliot Cousin, RN Phone Number: [phone] 09/11/2022, 1:50 PM   Clinical Narrative:                  CM spoke to pt's dtr at bedside. Pt lives at home with wife. States he has RW at home. Having difficulty now with ambulating. Waiting PT/OT recommendations. Explained differences between IP rehab vs SNF rehab. States she is agreeable to SNF rehab. Gave permission to fax referral and [facility] at [name].     Expected Discharge Plan: Skilled Nursing Facility Barriers to Discharge: Continued Medical Work up   Patient Goals and CMS Choice   CMS Medicare.gov Compare Post Acute Care list provided to:: Patient Represenative (must comment) (Daughter-[name])        Expected Discharge Plan and Services In-house Referral: Clinical Social Work   Post Acute Care Choice: Skilled Nursing Facility Living arrangements for the past 2 months: Single Family Home                                      Prior Living Arrangements/Services Living arrangements for the past 2 months: Single Family Home Lives with:: Spouse Patient language and need for interpreter reviewed:: Yes        Need for Family Participation in Patient Care: Yes (Comment) Care giver support system in place?: Yes (comment) Current home services: DME (rolling walker) Criminal Activity/Legal Involvement Pertinent to Current Situation/Hospitalization: No - Comment as needed  Activities of Daily Living      Permission Sought/Granted Permission sought to share information with : Case Manager, Family Supports Permission granted to share information with : Yes, Verbal Permission Granted  Share Information with NAME: August Saucer  Permission granted to share info w AGENCY: SNF, IP rehab, Home Health  Permission granted to share info w  Relationship: daughter  Permission granted to share info w Contact Information: [phone]  Emotional Assessment Appearance:: Appears stated age Attitude/Demeanor/Rapport: Unable to Assess Affect (typically observed): Unable to Assess Orientation: :  (not recorded)      Admission diagnosis:  Lactic acidosis [E87.20] Patient Active Problem List   Diagnosis Date Noted   MRSA (methicillin resistant Staphylococcus aureus) septicemia 09/08/2022   Pressure injury of skin 09/08/2022   Septic shock 09/03/2022   Pseudoaneurysm of femoral artery 09/03/2022   Decreased functional mobility 09/03/2022   Malnutrition of moderate degree 09/01/2022   Lactic acidosis 08/28/2022   ESRD (end stage renal disease) on dialysis 08/28/2022   Acute lower limb ischemia 08/21/2022   Leukocytosis 08/21/2022   Nucleic acid assay by PCR positive for norovirus 08/13/2022   Nausea vomiting and diarrhea 08/13/2022   Colitis 08/12/2022   Retroperitoneal hematoma 08/12/2022   C. difficile colitis 07/22/2022   Alcohol use disorder 06/30/2022   Clostridium difficile diarrhea 06/30/2022   Other ascites 06/30/2022   Acute systolic congestive heart failure 06/30/2022   ESRD on dialysis 06/28/2022   Spinal stenosis of cervical region 06/28/2022   Diarrhea 06/28/2022   Cirrhosis of liver 06/28/2022   Lymphedema 06/28/2022   HFrEF (heart failure with reduced ejection fraction) 06/28/2022   PAD (peripheral artery disease) 09/05/2021   Metabolic encephalopathy 05/09/2021   Thrombocytopenia 05/05/2021   Goals of care, counseling/discussion  Hypertension    COPD (chronic obstructive pulmonary disease)    Claudication in peripheral vascular disease 09/12/2019   Carotid stenosis 09/12/2019   PFO (patent foramen ovale) 08/10/2019   Noncompliance 08/10/2019   Hx of transient ischemic attack ([name]) 08/10/2019   Chronic diastolic heart failure    Elevated troponin    Hypertensive emergency    Acute pulmonary  edema 07/18/2019   ARF (acute renal failure) 07/18/2019   Macrocytic anemia 07/18/2019   ESRD (end stage renal disease) 11/09/2017   PCP:  Grayce SessionsEdwards, Michelle P, NP Pharmacy:   [facility] [phone]#[id number] - [place] - [address] AT [facility] [address] [place] [address] Phone: [phone] Fax: [phone]  [facility] [address] [place] [address] Phone: [phone] Fax: [phone]     Social Determinants of Health (SDOH) Social History: SDOH Screenings   Food Insecurity: Food Insecurity Present (08/22/2022)  Housing: Low Risk  (08/22/2022)  Transportation Needs: Unmet Transportation Needs (08/22/2022)  Utilities: Not At Risk (08/22/2022)  Alcohol Screen: Low Risk  (09/12/2019)  Depression (PHQ2-9): High Risk (05/07/2022)  Financial Resource Strain: Low Risk  (09/12/2019)  Stress: No Stress Concern Present (09/12/2019)  Tobacco Use: Medium Risk (09/09/2022)   SDOH Interventions:     Readmission Risk Interventions    08/24/2022   11:27 AM 08/14/2022   10:07 AM 07/24/2022   11:52 AM  Readmission Risk Prevention Plan  Transportation Screening Complete Complete Complete  Medication Review (RN Care Manager) Referral to Pharmacy Referral to Pharmacy Complete  PCP or Specialist appointment within 3-5 days of discharge  Complete Complete  HRI or Home Care Consult Complete Patient refused Complete  SW Recovery Care/Counseling Consult Complete Complete Patient refused  Palliative Care Screening Not Applicable Not Applicable Not Applicable  Skilled Nursing Facility Not Applicable Not Applicable Not Applicable

## 2022-09-11 NOTE — Progress Notes (Addendum)
Progress Note    09/11/2022 6:43 AM 2 Days Post-Op  Subjective:  says he has some pain in his ankle on the left.  afebrile  Vitals:   09/11/22 0546 09/11/22 0610  BP: (!) 144/70 (!) 151/86  Pulse: 79 71  Resp: 16 15  Temp: 98.5 F (36.9 C) 97.8 F (36.6 C)  SpO2: 99%     Physical Exam: General:  sleeping and wakes easily.  No distress Lungs:  non labored Incisions:  left groin with wound vac with good seal Extremities:  left foot unchanged    CBC    Component Value Date/Time   WBC 15.3 (H) 09/11/2022 0306   RBC 2.08 (L) 09/11/2022 0306   HGB 6.5 (LL) 09/11/2022 0306   HGB 10.4 (L) 06/18/2021 1431   HCT 20.1 (L) 09/11/2022 0306   HCT 31.6 (L) 06/18/2021 1431   PLT 158 09/11/2022 0306   PLT 142 (L) 06/18/2021 1431   MCV 96.6 09/11/2022 0306   MCV 96 06/18/2021 1431   MCH 31.3 09/11/2022 0306   MCHC 32.3 09/11/2022 0306   RDW 17.9 (H) 09/11/2022 0306   RDW 14.3 06/18/2021 1431   LYMPHSABS 1.5 08/28/2022 1810   LYMPHSABS 1.5 06/18/2021 1431   MONOABS 0.7 08/28/2022 1810   EOSABS 0.1 08/28/2022 1810   EOSABS 1.0 (H) 06/18/2021 1431   BASOSABS 0.1 08/28/2022 1810   BASOSABS 0.1 06/18/2021 1431    BMET    Component Value Date/Time   NA 136 09/11/2022 0441   NA 141 06/18/2021 1431   K 3.6 09/11/2022 0441   CL 97 (L) 09/11/2022 0441   CO2 23 09/11/2022 0441   GLUCOSE 89 09/11/2022 0441   BUN 37 (H) 09/11/2022 0441   BUN 20 06/18/2021 1431   CREATININE 4.04 (H) 09/11/2022 0441   CREATININE 1.15 02/19/2017 1049   CALCIUM 7.8 (L) 09/11/2022 0441   CALCIUM 8.5 (L) 06/10/2020 0900   GFRNONAA 15 (L) 09/11/2022 0441   GFRAA 6 (L) 03/04/2020 1045    INR    Component Value Date/Time   INR 1.2 08/12/2022 1501     Intake/Output Summary (Last 24 hours) at 09/11/2022 [id number] Last data filed at 09/10/2022 2300 Gross per 24 hour  Intake 1010.8 ml  Output 510 ml  Net 500.8 ml      Component 2 d ago  Specimen Description TISSUE  Special Requests VESSEL PT ON  VANC ANCEF  Gram Stain NO WBC SEEN RARE GRAM POSITIVE COCCI  Culture RARE STAPHYLOCOCCUS AUREUS CULTURE REINCUBATED FOR BETTER GROWTH Performed at [facility], [address] [place], [place] [address]  Report Status PENDING        Component 2 d ago  Specimen Description WOUND  Special Requests LEFT COMMON ILIAC STENTS PT ON VANC ANCEF  Gram Stain ABUNDANT WBC PRESENT,BOTH PMN AND MONONUCLEAR FEW GRAM POSITIVE COCCI IN PAIRS  Culture ABUNDANT STAPHYLOCOCCUS AUREUS SUSCEPTIBILITIES TO FOLLOW Performed at [facility], [address] [place], [place] [address]  Report Status PENDING        Component 2 d ago  Specimen Description TISSUE LEFT GROIN  Special Requests PT ON VANC ANCEF LEFT HEMATOMA  Gram Stain ABUNDANT WBC PRESENT, PREDOMINANTLY PMN RARE GRAM POSITIVE COCCI IN PAIRS  Culture RARE STAPHYLOCOCCUS AUREUS RARE ENTEROCOCCUS FAECIUM CULTURE REINCUBATED FOR BETTER GROWTH Performed at [facility], [address] [place], [place] [address]  Report Status PENDING      JP drain output 10cc.   Assessment/Plan:  67 y.o. male is s/p:  L femoral exploration with pseudoaneurysm repair 08/29/2022 RUE AVG thrombectomy 09/02/2022 TDC placement 09/07/2022 And  left common iliac to profunda femoris bypass with ipsilateral non-reversed greater saphenous vein in anatomic tunnel, left common iliac artery angioplasty and stenting, left greater saphenous vein harvest, left sartorious muscle flap to groin, left lower extremity angiography with first order cannulation 09/09/2022  2 Days Post-Op   -pt left foot unchanged from yesterday and still with pain left ankle.  Discussed with pt that at some point, he may need above knee amputation.  -all cultures growing staph aureus and last culture for tissue left groin growing rare enterococcus faecium and re-incubated for better growth.   Pt currently on Vanc. Zosyn discontinued by ID yesterday.  Leukocytosis down to 15.3k.    -hgb 6.5 this am-received one unit PRBC.  No evidence of bleeding from left groin.  JP output only 10cc past 24 hours.   Doreatha Massed, PA-C [facility] [phone] 09/11/2022 6:43 AM  VASCULAR STAFF ADDENDUM: I have independently interviewed and examined the patient. I agree with the above.  Doing OK. Remains critically ill. Slow to mobilize. Pain in L foot. Noted micro data. Continue Vanc for now. Await finalized data. WBC improving. High risk for AKA. Patient and family aware.  Rande Brunt. Lenell Antu, MD [facility] Phone Number: [phone] 09/11/2022 3:08 PM

## 2022-09-11 NOTE — Progress Notes (Addendum)
[facility]  Date of Admission:  08/28/2022     Principal Problem:   Lactic acidosis Active Problems:   Metabolic encephalopathy   ESRD (end stage renal disease) on dialysis   Malnutrition of moderate degree   Septic shock   Pseudoaneurysm of femoral artery   Decreased functional mobility   MRSA (methicillin resistant Staphylococcus aureus) septicemia   Pressure injury of skin          Assessment: 67 year old male with past medical history of MRSA bacteremia in 2022, PAD, ESRD HD, recently hospitalized 3/16-18 for acute on chronic left lower extremity ischemia.  He had undergone femoral endarterectomy with bovine pericardial patch, left iliofemoral profundofemoral thromboembolectomy, stent of the left common and external iliac arteries 3/16 with Dr. 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 - On 3/22 CT angio showed 1.1 cm left common femoral artery pseudoaneurysm, new.  New anterior pelvic extraperitoneal hematoma. -OR with Dr. Karin Lieuobins for left groin exploration, hematoma evacuation on 3/23. OR findings showed dehiscence at proximal aspect of bovine pericardial patch at the suture line. -Per OR note on 3/16 there is a stent in the left common and external iliac arteries.  The stents are left in place, patient will need to be on suppressive antibiotics following completion of likely 4-6 weeks of IV therapy. -MRI brain showed 4 mm acute infarct in right subinsular white matter on 3/29 -MRI L spine negative for infection, MR C spine showed improvement of marrow edema at C5-6. 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 -CT AP on 3/30 showed extension of hemorrhage in extraperitoneal lateral pelvic wall.  -TTE showed AV/MC valve thickening. TEE no vegetation - No plans at this time for bovine patch/stent removal given risks. From the  antibiotic perspective will plan on suppressive regimen once treatment IV course complete.  - OR on 09/09/22 for left common iliac artery angioplasty/stenting/interposition patch graft,  groin flap, Noted patch was dbrided and did not appear healthy. Has JP drain. Noted to have darkening of left 5 th toe->high risk for left AKA.  Recommendations: -  D/C Vancomycin - Start daptomycin. OR Cx from 09/09/22 + MRSA and e faecium.  MRSA MIC  trended up from <=0.5 on 3/23 blood cultures to 1 (09/09/22 OR) and E faecium present as such transition to daptomycin as above   #Leukocytosis 2/2 #1 -trending down    #History of ESRD on intermittent HD via right AV fistula -R AV fistula thrombectomy attempted and aborted on 3/28 due to severe HTN  Dr. Daiva EvesVan Dam will be covering this weekend.  Microbiology:   Antibiotics: Vancomycin 3/24-   Cultures: Blood 3/23 2/2 MRSA  3/25 GPC 3/27   SUBJECTIVE: Resting in  bed. No new complaints.  Interval:  Afebrile overnight. Wbc 16k Review of Systems: Review of Systems  All other systems reviewed and are negative.    Scheduled Meds:  (feeding supplement) PROSource Plus  30 mL Oral BID BM   sodium chloride   Intravenous Once   sodium chloride   Intravenous Once   acetaminophen  650 mg Oral Q6H   aspirin  EC  81 mg Oral Q0600   Chlorhexidine Gluconate Cloth  6 each Topical Q0600   Chlorhexidine Gluconate Cloth  6 each Topical Q0600   Chlorhexidine Gluconate Cloth  6 each Topical Q0600   darbepoetin (ARANESP) injection - DIALYSIS  100 mcg Subcutaneous Q Wed-1800   feeding supplement (NEPRO CARB STEADY)  237 mL Oral BID BM   heparin injection (subcutaneous)  5,000 Units  Subcutaneous Q8H   HYDROmorphone   Intravenous Q4H   methocarbamol  500 mg Oral TID   multivitamin  1 tablet Oral QHS   pantoprazole  40 mg Oral Daily   rosuvastatin  10 mg Oral Daily   sodium chloride flush  10-40 mL Intracatheter Q12H   tetracaine  100 mg Infiltration Once   vancomycin variable dose per unstable renal function (pharmacist dosing)   Does not apply See admin instructions   Continuous Infusions:  sodium chloride Stopped (09/08/22 0904)   sodium chloride 0 mL (09/05/22 1840)   sodium chloride     sodium chloride Stopped (09/10/22 1727)   magnesium sulfate bolus IVPB     PRN Meds:.Place/Maintain arterial line **AND** sodium chloride, sodium chloride, alteplase, diphenhydrAMINE **OR** diphenhydrAMINE, docusate sodium, heparin, hydrALAZINE, magnesium sulfate bolus IVPB, naloxone **AND** sodium chloride flush, ondansetron (ZOFRAN) IV, ondansetron (ZOFRAN) IV, mouth rinse, oxyCODONE, pentafluoroprop-tetrafluoroeth, polyethylene glycol, sodium chloride flush Allergies  Allergen Reactions   Benadryl [Diphenhydramine] Rash   Lidocaine-Prilocaine Rash    Caused red marks up and down arm    OBJECTIVE: Vitals:   09/11/22 1008 09/11/22 1030 09/11/22 1100 09/11/22 1130  BP: (!) 145/78 (!) 109/58 (!) 128/58 137/76  Pulse: 76 69 66 (!) 222  Resp: 18 13 15 14   Temp:      TempSrc:      SpO2: 99% 98% 98% (!) 66%  Weight:      Height:       Body mass index is 24.07 kg/m.  Physical Exam Constitutional:      General: He is not in acute distress.    Appearance: He is normal weight. He is not toxic-appearing.  HENT:     Head: Normocephalic and atraumatic.     Right Ear: External ear normal.     Left Ear: External ear normal.     Nose: No congestion or rhinorrhea.     Mouth/Throat:     Mouth: Mucous membranes are moist.     Pharynx: Oropharynx is clear.  Eyes:     Extraocular Movements: Extraocular movements intact.     Conjunctiva/sclera: Conjunctivae normal.      Pupils: Pupils are equal, round, and reactive to light.  Cardiovascular:     Rate and Rhythm: Normal rate and regular rhythm.     Heart sounds: No murmur heard.    No friction rub. No gallop.  Pulmonary:     Effort: Pulmonary effort is normal.     Breath sounds: Normal breath sounds.  Abdominal:     General: Abdomen is flat. Bowel sounds are normal.     Palpations: Abdomen is soft.  Musculoskeletal:        General: No swelling.     Cervical back: Normal range of motion and neck supple.     Comments: Left groin wound with staples  Skin:    General: Skin is warm and dry.     Comments: Wound vac  Neurological:     General: No focal deficit present.     Mental Status: He is oriented to person, place, and time.  Psychiatric:        Mood and Affect: Mood normal.       Lab Results Lab Results  Component Value Date   WBC 15.3 (H) 09/11/2022   HGB 6.5 (LL) 09/11/2022   HCT 20.1 (L) 09/11/2022   MCV 96.6 09/11/2022   PLT 158 09/11/2022    Lab Results  Component Value Date   CREATININE 4.04 (H) 09/11/2022   BUN 37 (H) 09/11/2022   NA 136 09/11/2022   K 3.6 09/11/2022   CL 97 (L) 09/11/2022   CO2 23 09/11/2022    Lab Results  Component Value Date   ALT <5 08/28/2022   AST 21 08/28/2022   ALKPHOS 122 08/28/2022   BILITOT 1.2 08/28/2022        Danelle Earthly, MD [facility] 09/11/2022, 12:07 PM

## 2022-09-11 NOTE — Progress Notes (Signed)
Nutrition Follow-up  DOCUMENTATION CODES:   Non-severe (moderate) malnutrition in context of chronic illness  INTERVENTION:   Plan to hold on Cortrak for now given increased intake, pt taking supplements. Informal calorie count ordered through the weekend to better assess nutritional intake given very limited meal completion documentation.   Recommend continuing Regular diet with 1200 mL fluid restriction until pt with consistent adequate po intake with improving nutritional status. Pt is malnourished with increased needs for acute illness and wound healing and dietary restrictions will only impede oral intake  Continue Nepro Shake po TID, each supplement provides 425 kcal and 19 grams protein  Continue 30 ml ProSource Plus TID, each supplement provides 100 kcals and 15 grams protein.   Continue Renal MVI  Given pt's poor nutritional status with poor oral intake in addition to increased vitamin losses from RRT and wounds, recommend checking Vitamin C, Zinc and Vitamin A levels with CRP with aggressive supplementation if deficient. This will need to be addressed if pt ends up requiring amputation.    NUTRITION DIAGNOSIS:   Moderate Malnutrition (in the context of chronic illness (ESRD)) related to poor appetite as evidenced by moderate fat depletion, severe muscle depletion.  Being addressed via liberalized diet, supplements  GOAL:   Patient will meet greater than or equal to 90% of their needs  Progressing  MONITOR:   PO intake, Supplement acceptance, I & O's, Labs, Weight trends  REASON FOR ASSESSMENT:   Consult, Ventilator Enteral/tube feeding initiation and management (trickle tube feeds)  ASSESSMENT:   Pt admitted with abdominal pain, nausea, diaphoresis and unable to feel L foot d/t L femoral pseudoaneurysm. Pt was hypotensive with lactic acidosis upon admission. PMH significant for ESRD, PAD, COPD, HLD, HTN, HFrEF 35-40%.  3/22 Admit, emergent CRRT  3/23 OR L fem  artery pseudoaneurysm s/p repair 3/24 Extubated 3/26 CRRT discontinued 3/27 RUE AVG thrombectomy 4/01 IR placed new [facility] 4/03 OR for infected pseudoaneurysm of L femoral patch angioplasty, wound VAC  Noted per vascular surgery, pt may require L AKA at some point  Very limited documentation of recent po intake. Per RN yesterday, pt ate 100% of breakfast and 50% of lunch, drank at least one  Nepro shake. No info regarding dinner or any other supplements. Upon assessment on visit today, pt finishing lunch and ate 100% tilapia and mac n cheese. Pt drinking Nepro shake, took Pro-source this AM. This AM, pt took bites of eggs and toast  RN reports pt indicating that Nepro gives him heartburn. RD discussed this with pt on visit today. Pt indicates he has been tolerating them fine recently, he used to experience heartburn after taking one sip but no issues recently. Pt is on Protonix  Given improved intake and hopefully fewer, if any, prolonged periods of NPO for procedure. Discussed importance of adequate nutrition but pt and family agree they would like to hold off Cortrak tube as pt eating better and willing to take supplements.   Phosphorus improved from 9.0 to 5.0 with iHD yesterday, 500 mL fluid removed during treatment. Noted outpatient phosphorus binder restarted today  Noted hyperkalemia recently between treatments, already on Nepro supplement, improves with iHD and actually low yesterday  Outpatient EDW 64.5 kg, current wt 71.8 g, weight 71.5 kg post iHD yesterday. Weights all over the place this admission, unsure of accuracy of weights from 4/01 (43.9 kg)  JP drain to L anterior thigh with minimal output; no documentation of wound VAC output. Noted around a couple 100 mL  of fluid in cannister but unsure over what time frame.   Labs: potassium 3.6 (wdl), phosphorus 5.0 (wdl) Meds: Renal MVI, protonix, mag sulfate, benadryl prn for itching, aranesp, calcitriol   Diet Order:   Diet Order              Diet regular Room service appropriate? Yes; Fluid consistency: Thin; Fluid restriction: 1200 mL Fluid  Diet effective now                   EDUCATION NEEDS:   Education needs have been addressed  Skin:  Skin Assessment: Skin Integrity Issues: Skin Integrity Issues:: DTI, Wound VAC DTI: L heel Stage II: coccyx Wound Vac: L groin Incisions: L groin (closed)  Last BM:  3/31 - type 6  Height:   Ht Readings from Last 1 Encounters:  09/09/22 5\' 8"  (1.727 m)    Weight:   Wt Readings from Last 1 Encounters:  09/11/22 71.8 kg    Ideal Body Weight:  70 kg  BMI:  Body mass index is 24.07 kg/m.  Estimated Nutritional Needs:   Kcal:  1900-2100  Protein:  95-110g/d  Fluid:  1L + UOP    Albert Starcherate Lavone Barrientes MS, RDN, LDN, CNSC Registered Dietitian 3 Clinical Nutrition RD Pager and On-Call Pager Number Located in [email or web address]

## 2022-09-12 ENCOUNTER — Encounter (HOSPITAL_COMMUNITY): Payer: Self-pay | Admitting: Pulmonary Disease

## 2022-09-12 ENCOUNTER — Encounter (HOSPITAL_COMMUNITY)
Admission: EM | Disposition: A | Discharge status: Hospice | Payer: Self-pay | Source: Home / Self Care | Attending: Internal Medicine

## 2022-09-12 ENCOUNTER — Inpatient Hospital Stay (HOSPITAL_COMMUNITY): Payer: Medicare Other | Admitting: Anesthesiology

## 2022-09-12 DIAGNOSIS — K626 Ulcer of anus and rectum: Secondary | ICD-10-CM | POA: Diagnosis not present

## 2022-09-12 DIAGNOSIS — K921 Melena: Secondary | ICD-10-CM | POA: Diagnosis not present

## 2022-09-12 DIAGNOSIS — I509 Heart failure, unspecified: Secondary | ICD-10-CM

## 2022-09-12 DIAGNOSIS — J449 Chronic obstructive pulmonary disease, unspecified: Secondary | ICD-10-CM

## 2022-09-12 DIAGNOSIS — E872 Acidosis, unspecified: Secondary | ICD-10-CM | POA: Diagnosis not present

## 2022-09-12 DIAGNOSIS — N189 Chronic kidney disease, unspecified: Secondary | ICD-10-CM

## 2022-09-12 DIAGNOSIS — I10 Essential (primary) hypertension: Secondary | ICD-10-CM

## 2022-09-12 DIAGNOSIS — R6521 Severe sepsis with septic shock: Secondary | ICD-10-CM | POA: Diagnosis not present

## 2022-09-12 DIAGNOSIS — Z87891 Personal history of nicotine dependence: Secondary | ICD-10-CM | POA: Diagnosis not present

## 2022-09-12 DIAGNOSIS — A4102 Sepsis due to Methicillin resistant Staphylococcus aureus: Secondary | ICD-10-CM | POA: Diagnosis not present

## 2022-09-12 DIAGNOSIS — D62 Acute posthemorrhagic anemia: Secondary | ICD-10-CM

## 2022-09-12 HISTORY — PX: FLEXIBLE SIGMOIDOSCOPY: SHX5431

## 2022-09-12 LAB — HEMOGLOBIN AND HEMATOCRIT, BLOOD
HCT: 23.2 % — ABNORMAL LOW (ref 39.0–52.0)
HCT: 19.2 % — ABNORMAL LOW (ref 39.0–52.0)
HCT: 25.8 % — ABNORMAL LOW (ref 39.0–52.0)
Hemoglobin: 7.6 g/dL — ABNORMAL LOW (ref 13.0–17.0)
Hemoglobin: 6.4 g/dL — CL (ref 13.0–17.0)
Hemoglobin: 8.5 g/dL — ABNORMAL LOW (ref 13.0–17.0)

## 2022-09-12 LAB — CBC
HCT: 21 % — ABNORMAL LOW (ref 39.0–52.0)
HCT: 27.4 % — ABNORMAL LOW (ref 39.0–52.0)
Hemoglobin: 7 g/dL — ABNORMAL LOW (ref 13.0–17.0)
Hemoglobin: 9.1 g/dL — ABNORMAL LOW (ref 13.0–17.0)
MCH: 31.1 pg (ref 26.0–34.0)
MCH: 30.5 pg (ref 26.0–34.0)
MCHC: 33.3 g/dL (ref 30.0–36.0)
MCHC: 33.2 g/dL (ref 30.0–36.0)
MCV: 93.3 fL (ref 80.0–100.0)
MCV: 91.9 fL (ref 80.0–100.0)
Platelets: 173 10*3/uL (ref 150–400)
Platelets: 170 10*3/uL (ref 150–400)
RBC: 2.25 MIL/uL — ABNORMAL LOW (ref 4.22–5.81)
RBC: 2.98 MIL/uL — ABNORMAL LOW (ref 4.22–5.81)
RDW: 18.4 % — ABNORMAL HIGH (ref 11.5–15.5)
RDW: 17 % — ABNORMAL HIGH (ref 11.5–15.5)
WBC: 15.7 10*3/uL — ABNORMAL HIGH (ref 4.0–10.5)
WBC: 17.4 10*3/uL — ABNORMAL HIGH (ref 4.0–10.5)
nRBC: 0.1 % (ref 0.0–0.2)
nRBC: 0.5 % — ABNORMAL HIGH (ref 0.0–0.2)

## 2022-09-12 LAB — BASIC METABOLIC PANEL
Anion gap: 10 (ref 5–15)
BUN: 26 mg/dL — ABNORMAL HIGH (ref 8–23)
CO2: 27 mmol/L (ref 22–32)
Calcium: 7.7 mg/dL — ABNORMAL LOW (ref 8.9–10.3)
Chloride: 98 mmol/L (ref 98–111)
Creatinine, Ser: 2.85 mg/dL — ABNORMAL HIGH (ref 0.61–1.24)
GFR, Estimated: 23 mL/min — ABNORMAL LOW (ref >60)
Glucose, Bld: 89 mg/dL (ref 70–99)
Potassium: 3.7 mmol/L (ref 3.5–5.1)
Sodium: 135 mmol/L (ref 135–145)

## 2022-09-12 LAB — RENAL FUNCTION PANEL
Albumin: 1.7 g/dL — ABNORMAL LOW (ref 3.5–5.0)
Anion gap: 13 (ref 5–15)
BUN: 64 mg/dL — ABNORMAL HIGH (ref 8–23)
CO2: 23 mmol/L (ref 22–32)
Calcium: 7.8 mg/dL — ABNORMAL LOW (ref 8.9–10.3)
Chloride: 98 mmol/L (ref 98–111)
Creatinine, Ser: 5.48 mg/dL — ABNORMAL HIGH (ref 0.61–1.24)
GFR, Estimated: 11 mL/min — ABNORMAL LOW (ref >60)
Glucose, Bld: 103 mg/dL — ABNORMAL HIGH (ref 70–99)
Phosphorus: 7.6 mg/dL — ABNORMAL HIGH (ref 2.5–4.6)
Potassium: 3.9 mmol/L (ref 3.5–5.1)
Sodium: 134 mmol/L — ABNORMAL LOW (ref 135–145)

## 2022-09-12 LAB — PREPARE RBC (CROSSMATCH)

## 2022-09-12 LAB — GLUCOSE, CAPILLARY: Glucose-Capillary: 112 mg/dL — ABNORMAL HIGH (ref 70–99)

## 2022-09-12 SURGERY — SIGMOIDOSCOPY, FLEXIBLE
Anesthesia: Monitor Anesthesia Care

## 2022-09-12 MED ORDER — PROPOFOL 500 MG/50ML IV EMUL
INTRAVENOUS | Status: DC | PRN
  Administered 2022-09-12: 100 ug/kg/min via INTRAVENOUS

## 2022-09-12 MED ORDER — SODIUM CHLORIDE 0.9% IV SOLUTION: Freq: Once | INTRAVENOUS | Status: AC

## 2022-09-12 MED ORDER — SODIUM CHLORIDE 0.9 % IV SOLN: INTRAVENOUS | Status: DC | PRN

## 2022-09-12 MED ORDER — ACETAMINOPHEN 325 MG PO TABS: 650.0000 mg | ORAL_TABLET | Freq: Once | ORAL | Status: DC

## 2022-09-12 MED ORDER — SODIUM CHLORIDE 0.9 % IV SOLN
600.0000 mg | Freq: Once | INTRAVENOUS | Status: AC
  Administered 2022-09-12: 600 mg via INTRAVENOUS
  Filled 2022-09-12: qty 12

## 2022-09-12 MED ORDER — POLYETHYLENE GLYCOL 3350 17 G PO PACK
17.0000 g | PACK | Freq: Two times a day (BID) | ORAL | Status: DC
  Administered 2022-09-12: 17 g via ORAL
  Filled 2022-09-12: qty 1

## 2022-09-12 MED ORDER — PHENYLEPHRINE 80 MCG/ML (10ML) SYRINGE FOR IV PUSH (FOR BLOOD PRESSURE SUPPORT)
PREFILLED_SYRINGE | INTRAVENOUS | Status: DC | PRN
  Administered 2022-09-12 (×3): 160 ug via INTRAVENOUS

## 2022-09-12 MED ORDER — CALCITRIOL 0.25 MCG PO CAPS
ORAL_CAPSULE | ORAL | Status: AC
  Administered 2022-09-12: 0.25 ug
  Filled 2022-09-12: qty 1

## 2022-09-12 NOTE — Progress Notes (Signed)
   VASCULAR SURGERY ASSESSMENT & PLAN:   POD 3 ILIOFEMORAL BYPASS, REMOVAL OF INFECTED PATCH, SARTORIUS FLAP: His VAC is in place.  The JP put out 45 cc last shift.  This will stay in for now.  The left foot is unchanged.  The he has ischemia with a peroneal signal only.  The left calf is soft.  He is on aspirin and a statin.  ANEMIA: His hemoglobin this morning is 7.6.  He did reportedly have a bloody bowel movement and GI has been consulted.  Of note, in looking at the op note he lost 600 cc intraoperatively.  His subcu heparin was held.  END-STAGE RENAL DISEASE: He has a functioning catheter.  ID: His intraoperative culture grew MRSA.  He is on IV Cubicin.    SUBJECTIVE:   No specific complaints this morning.  He does not move the left foot well but he is not having significant rest pain.  PHYSICAL EXAM:   Vitals:   09/12/22 0430 09/12/22 0442 09/12/22 0530 09/12/22 0630  BP: 97/76  91/69 (!) 111/59  Pulse: 86  88 88  Resp: 14 12 13 16   Temp:      TempSrc:      SpO2: 98%  100% 100%  Weight:      Height:       The VAC has a good seal in the left groin. Peroneal signal only with the Doppler. Decreased motor function left foot.  Sensation is present.  LABS:   Lab Results  Component Value Date   WBC 15.7 (H) 09/12/2022   HGB 7.6 (L) 09/12/2022   HCT 23.2 (L) 09/12/2022   MCV 93.3 09/12/2022   PLT 173 09/12/2022   Lab Results  Component Value Date   CREATININE 5.48 (H) 09/12/2022   Lab Results  Component Value Date   INR 1.2 08/12/2022   CBG (last 3)  Recent Labs    09/09/22 0808 09/11/22 2122 09/12/22 0646  GLUCAP 114* 111* 112*    PROBLEM LIST:    Principal Problem:   Lactic acidosis Active Problems:   Metabolic encephalopathy   ESRD (end stage renal disease) on dialysis   Malnutrition of moderate degree   Septic shock   Pseudoaneurysm of femoral artery   Decreased functional mobility   MRSA (methicillin resistant Staphylococcus aureus)  septicemia   Pressure injury of skin   CURRENT MEDS:    (feeding supplement) PROSource Plus  30 mL Oral TID BM   acetaminophen  650 mg Oral Q6H   aspirin EC  81 mg Oral Q0600   calcitRIOL  0.25 mcg Oral Q T,Th,Sa-HD   calcium acetate  1,334 mg Oral TID WC   Chlorhexidine Gluconate Cloth  6 each Topical Q0600   darbepoetin (ARANESP) injection - DIALYSIS  100 mcg Subcutaneous Q Wed-1800   feeding supplement (NEPRO CARB STEADY)  237 mL Oral TID BM   HYDROmorphone   Intravenous Q4H   methocarbamol  500 mg Oral TID   multivitamin  1 tablet Oral QHS   pantoprazole  40 mg Oral Daily   rosuvastatin  10 mg Oral Daily   sodium chloride flush  10-40 mL Intracatheter Q12H   tetracaine  100 mg Infiltration Once    Waverly Ferrari Office: [phone] 09/12/2022

## 2022-09-12 NOTE — Progress Notes (Addendum)
On call vascular physician notified of patient having large bloody BM and patient asymptomatic.  See provider notification in flowsheets for more information.

## 2022-09-12 NOTE — Anesthesia Postprocedure Evaluation (Signed)
Anesthesia Post Note  Patient: Albert Harris  Procedure(s) Performed: FLEXIBLE SIGMOIDOSCOPY     Patient location during evaluation: PACU Anesthesia Type: MAC Level of consciousness: awake Pain management: pain level controlled Vital Signs Assessment: post-procedure vital signs reviewed and stable Respiratory status: spontaneous breathing, nonlabored ventilation and respiratory function stable Cardiovascular status: stable Postop Assessment: no apparent nausea or vomiting Anesthetic complications: no   No notable events documented.  Last Vitals:  Vitals:   09/12/22 1715 09/12/22 1730  BP: (!) 116/46 136/65  Pulse: 83 78  Resp: 20 (!) 24  Temp:  36.9 C  SpO2: 94% 96%    Last Pain:  Vitals:   09/12/22 1730  TempSrc:   PainSc: 0-No pain                 Akshat Minehart P Kobyn Kray

## 2022-09-12 NOTE — H&P (View-Only) (Signed)
Referring Provider: Dr. Si Raider Primary Care Physician:  [name], NP Primary Gastroenterologist: Gentry Fitz  Reason for Consultation: Bloody stools  HPI: Albert Harris is a 67 y.o. male with a past medical history of hypertension, hyperlipidemia, peripheral vascular disease s/p right femoropopliteal bypass 08/2021, carotid artery stenosis, EVA, PFO, CHF with LVEF 50 - 55% per TEE 09/2022, ESRD on HD,  C.diff 06/2022, alcohol use disorder, lower GI bleed secondary to rectal solitary ulcer 2022. 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  Patient was admitted to the hospital 08/28/2022 with septic shock secondary to MRSA bacteremia from left groin wound with bleeding s/p left common iliac to profunda femoris bypass and left common iliac artery angioplasty and stent placement followed by ID on IV antibiotics.  A GI consult was requested after the patient passed 2 bloody bowel movements overnight.  Hemoglobin 4 days ago was 8 -> 7.6 -> Hg 6.5 on 4/5 -> transfused 1 unit of PRBCs -> Hg 8.6 -> 8.0 -> today Hg 7.0.  Subcu heparin held.  Labs today WBC 15.7 down from 16.7.  Hemoglobin 7.0 -> 6.4.  HCT 21.  Platelet 173.  BUN 64.  Creatinine 5.48.  Albumin 1.7.  2 units of PRBCs ordered, to transfuse during dialysis.  His RN reported he passed 2 bright red bloody bowel movements last night without recurrence thus far today.  He denies having any nausea, vomiting or abdominal pain.  No rectal pain.  Prior to this hospitalization, he reported his prior diarrhea abated and he denied having any issues with constipation.    PRIOR IMAGE STUDIES:  CTA 09/09/2022: FINDINGS: VASCULAR   Aorta: Severe burden of aortic calcified and calcified atherosclerosis. No aneurysm, dissection, vasculitis or significant stenosis.   Celiac: Calcified ostial atherosclerosis with short segment proximal celiac  artery stenosis, approximately 50%, with mild post stenotic dilatation. Well opacified celiac axis. No evidence of aneurysm, dissection or vasculitis.   SMA: Calcified noncalcified ostial atherosclerosis with 2.9 cm segment of up to severe proximal SMA stenosis. No evidence of aneurysm, dissection or vasculitis.   Renals: Single renal arteries are present bilaterally. Severe bilateral renal artery stenoses, greater on LEFT with at least 2 cm proximal occlusion. Severe burden of bilateral calcified atherosclerosis of the segmental and interlobar arteries. No aneurysm or dissection.   IMA: Severe burden of ostial atherosclerosis, otherwise distally patent. No evidence of aneurysm, dissection or vasculitis.   RIGHT Lower Extremity   Inflow: Patent RIGHT CIA and EIA overlapping stents. Occluded RIGHT hypogastric artery. No evidence of aneurysm, dissection or vasculitis.   Outflow: Occluded RIGHT native SFA with short segment retrograde reconstitution from the TP trunk to the level of the adductor hiatus. RIGHT femoral-to-popliteal bypass, widely patent. Diseased but patent RIGHT profunda femoris. No evidence of aneurysm or dissection.   Runoff: Severely diseased but patent, at least two-vessel, runoff to the ankle.   LEFT Lower Extremity   Inflow: Patent LEFT CIA and EIA overlapping stents. Occluded LEFT hypogastric artery. New pseudoaneurysm at the distal LEFT EIA, measuring 8 mm and medial to the stent distal landing zone. See key image. No aneurysm or dissection.   Outflow:   *Near-complete thrombosis of prior pseudoaneurysm at the LEFT CFA pseudoaneurysm. *New linear filling defect extending from the pseudoaneurysm neck and extending peripherally into the distal CFA, measuring up to 3.5 cm. *Patent LEFT profunda femoris, with occluded LEFT SFA along its entirety. Distal reconstitution  of the popliteal artery with diseased but patent trifurcation. *No evidence of  aneurysm or dissection.   Runoff: Severely diseased but patent, at least two-vessel, runoff to the ankle.   Veins: No obvious venous abnormality within the limitations of this arterial phase study.   Review of the MIP images confirms the above findings.   NON-VASCULAR   Lower chest: No acute abnormality. Calcified multivessel coronary atherosclerosis. Nodular opacities at the lung bases, greater on RIGHT measuring 1.4 cm, similar in appearance to 09/05/2022 comparison though new since 08/12/2022.   Hepatobiliary: No focal liver abnormality is seen. No gallstones, gallbladder wall thickening, or biliary dilatation.   Pancreas: No pancreatic ductal dilatation or surrounding inflammatory changes.   Spleen: Normal in size without focal abnormality. Perihilar accessory spleen, abutting the tail of pancreas and measuring 2.5 cm   Adrenals/Urinary Tract: Adrenal glands are unremarkable. Atrophic kidneys. RIGHT perirenal stranding. No renal calculi, focal lesion, or hydronephrosis. Bladder is decompressed.   Stomach/Bowel: Stomach is within normal limits. Appendix appears normal. Moderate burden of sigmoid diverticulosis. No evidence of bowel wall thickening, distention, or inflammatory changes.   Lymphatic: No enlarged abdominal or pelvic lymph nodes.   Reproductive: Prostate is borderline enlarged, otherwise unremarkable.   Other:   *No abdominal wall hernia. No abdominopelvic ascites. *Retroperitoneal hematoma, extending cephalad along the rectus sheath, and anterior inferiorly to the retropubic and LEFT anterior pelvic spaces, measuring up to 6.4 x 12.2 x 13.0 cm (AP by transaxial by CC). Previously 6.4 x 11.2 cm (AP by transaxial) *Hematoma extension along the LEFT iliopsoas, new since 08/28/2022 comparison   Musculoskeletal: No interval osseous abnormality. Bilateral groin cutdown scars, more recently on LEFT with a overlying surgical staples.   IMPRESSION: Since  CTA runoff dated 08/28/2022;   VASCULAR  1. Thrombosis of LEFT CFA pseudoaneurysm with new 3.5 cm filling defect, consistent with arterial thrombus, extending from the PSA neck and into the distal CFA. This is at risk for distal embolization and potential acute LEFT lower extremity ischemia. 2. New 8 mm distal LEFT EIA pseudoaneurysm, medial to the stent distal landing zone. 3. Mild interval enlargement of retroperitoneal hematoma, in a craniocaudal dimension measuring 12.2 cm (previously 11.2 cm). No CTA evidence of active extravasation. 4. Patent RIGHT femoral-to-popliteal graft and bilateral iliac stents. 5. Severe burden of splanchnic arterial atherosclerosis, at risk for chronic mesenteric ischemia.   NON-VASCULAR  1. Bibasilar nodular pulmonary opacities, new since 08/2022, favored infectious/inflammatory. Attention on follow-up. 2. Sigmoid diverticulosis and Aortic Atherosclerosis (ICD10-I70.0).  RUQ ultrasound  06/28/2022 showed liver cirrhosis, moderate ascites at RUQ. Gallbladder wall thickening without stones or sonographic Murphy's. CBD 4 mm. Non contrast CT imaging of abdomen pelvis in late March and early April 2023.  Neither of these demonstrated findings of cirrhosis or fatty liver etc March CT showed a 1.5 cm hypodense area in the posterior right hepatic lobe demonstrating non-specific nodularity at periphery. RUQ ultrasound 04/2021 like wise showed no changes in the liver but did demonstrate small perihepatic ascites  CTAP 09/05/2022 showed a normal liver.  HBV surf Ag non-reactive. HBV surface Ab pndg. HCV, HBV core Ab non-reactive, HBV surface Ab reactive w quant 21 cw immunity 12/2021. HBV core Ab total and IgM both non reactive in 2022, 2023.    PAST GI PROCEDURES:  Colonoscopy 06/27/2020 as an inpatient by Dr. Elnoria Howard: - A single (solitary) ulcer in the rectum. Clip (MR unsafe) was placed.  - Diverticulosis in the sigmoid colon and in the descending colon.  - Large  Int/Ext hemorrhoids.  - No specimens collected.  Colonoscopy 2006: Unable to locate procedure report path report showed a hyperplastic polyp   Past Medical History:  Diagnosis Date   Acute metabolic encephalopathy 05/09/2021   Acute on chronic diastolic CHF (congestive heart failure) 12/15/2020   Acute respiratory disease due to COVID-19 virus 06/16/2020   Alcohol use disorder 06/30/2022   Anemia    Asthma    Bacteremia due to Gram-positive bacteria 05/09/2021   Carotid stenosis 09/12/2019   CHF (congestive heart failure)    Claudication in peripheral vascular disease 09/12/2019   Complication of anesthesia    " i HAVE A HARD TIME WAKING UP " (only one time)   COPD (chronic obstructive pulmonary disease)    COVID-19    positive on  06/16/20   Critical limb ischemia of right lower extremity    ESRD on hemodialysis    t, th. sat dialysis   GERD (gastroesophageal reflux disease)    HLD (hyperlipidemia)    Hypertension    Hypertensive urgency 12/24/2016   Lower GI bleed    Nausea vomiting and diarrhea 08/13/2022   Peripheral vascular disease    PFO with atrial septal aneurysm    by echo 11/2017   Pneumonia    Stroke    Surgical wound infection 09/20/2021   [name] (transient ischemic attack) 11/2017   Tobacco abuse    quit 07/17/19   Volume overload 12/14/2020    Past Surgical History:  Procedure Laterality Date   ABDOMINAL AORTOGRAM W/LOWER EXTREMITY N/A 06/21/2020   Procedure: ABDOMINAL AORTOGRAM W/LOWER EXTREMITY;  Surgeon: Chuck Hintickson, Christopher S, MD;  [facility];  Service: Cardiovascular;  Laterality: N/A;   ABDOMINAL AORTOGRAM W/LOWER EXTREMITY Right 08/29/2021   Procedure: ABDOMINAL AORTOGRAM W/LOWER EXTREMITY;  Surgeon: Leonie DouglasHawken, Thomas N, MD;  Location: [facility] INVASIVE CV LAB;  Service: Cardiovascular;  Laterality: Right;   ABDOMINAL AORTOGRAM W/LOWER EXTREMITY N/A 08/10/2022   Procedure: ABDOMINAL AORTOGRAM W/LOWER EXTREMITY;  Surgeon: Chuck Hintickson, Christopher S, MD;   [facility];  Service: Cardiovascular;  Laterality: N/A;   AORTOGRAM N/A 08/22/2022   Procedure: AORTOGRAM;  Surgeon: Maeola Harmanain, Brandon Christopher, MD;  [facility];  Service: Vascular;  Laterality: N/A;   APPLICATION OF WOUND VAC Right 09/21/2021   Procedure: APPLICATION OF WOUND VAC;  Surgeon: Chuck Hintickson, Christopher S, MD;  [facility];  Service: Vascular;  Laterality: Right;   APPLICATION OF WOUND VAC Left 08/28/2022   Procedure: APPLICATION OF PREVENA WOUND VAC WITH PLACEMENT OF MYRIAD MORCELLS;  Surgeon: Victorino Sparrowobins, Joshua E, MD;  [facility];  Service: Vascular;  Laterality: Left;   ARTERY REPAIR Left 08/28/2022   Procedure: LEFT EXTERNAL ILIAC ARTERY REPAIR;  Surgeon: Victorino Sparrowobins, Joshua E, MD;  [facility];  Service: Vascular;  Laterality: Left;   AV FISTULA PLACEMENT Left 03/28/2020   Procedure: ARTERIOVENOUS (AV) FISTULA CREATION LEFT;  Surgeon: Cephus Shellinglark, Christopher J, MD;  [facility];  Service: Vascular;  Laterality: Left;   AV FISTULA PLACEMENT Right 09/23/2020   Procedure: RIGHT Arm ARTERIOVENOUS GRAFT CREATION.;  Surgeon: Chuck Hintickson, Christopher S, MD;  [facility];  Service: Vascular;  Laterality: Right;   COLONOSCOPY     COLONOSCOPY WITH PROPOFOL N/A 06/27/2020   Procedure: COLONOSCOPY WITH PROPOFOL;  Surgeon: Jeani HawkingHung, Patrick, MD;  [facility];  Service: Endoscopy;  Laterality: N/A;   DRAINAGE AND CLOSURE OF LYMPHOCELE Right 09/21/2021   Procedure: EVACUATION OF LYMPHOCELE RIGHT GROIN;  Surgeon: Chuck Hintickson, Christopher S, MD;  [facility];  Service: Vascular;  Laterality: Right;   ENDARTERECTOMY FEMORAL Right 09/05/2021   Procedure: ENDARTERECTOMY FEMORAL;  Surgeon: Chuck Hint, MD;  Location[facility] OR;  Service: Vascular;  Laterality: Right;   FEMORAL-POPLITEAL BYPASS GRAFT Right 09/05/2021   Procedure: RIGHT FEMORAL-POPLITEAL ARTERY BYPASS WITH Spaphenous Vein;  Surgeon: Chuck Hint, MD;  Location: [facility] OR;  Service: Vascular;   Laterality: Right;   FINGER SURGERY     HEMOSTASIS CLIP PLACEMENT  06/27/2020   Procedure: HEMOSTASIS CLIP PLACEMENT;  Surgeon: Jeani Hawking, MD;  Location: [facility] ENDOSCOPY;  Service: Endoscopy;;   INSERTION OF DIALYSIS CATHETER Right 06/24/2020   Procedure: INSERTION OF TUNNELED  DIALYSIS CATHETER; Removal of temporary dialysis catheter right neck;  Surgeon: Chuck Hint, MD;  Location: [facility] OR;  Service: Vascular;  Laterality: Right;   INSERTION OF DIALYSIS CATHETER N/A 09/07/2022   Procedure: INSERTION OF DIALYSIS CATHETER;  Surgeon: Victorino Sparrow, MD;  Location[facility] OR;  Service: Vascular;  Laterality: N/A;   INSERTION OF ILIAC STENT Left 08/22/2022   Procedure: INSERTION OF ILIAC STENT;  Surgeon: Maeola Harman, MD;  Location: [facility] OR;  Service: Vascular;  Laterality: Left;   IR PARACENTESIS  06/30/2022   LIGATION OF ARTERIOVENOUS  FISTULA Left 07/24/2020   Procedure: LIGATION OF LEFT BRACHIOCEPHALIC ARTERIOVENOUS  FISTULA;  Surgeon: Cephus Shelling, MD;  [facility] OR;  Service: Vascular;  Laterality: Left;   PERIPHERAL VASCULAR INTERVENTION Right 06/21/2020   Procedure: PERIPHERAL VASCULAR INTERVENTION;  Surgeon: Chuck Hint, MD;  [facility] INVASIVE CV LAB;  Service: Cardiovascular;  Laterality: Right;   PERIPHERAL VASCULAR INTERVENTION Left 08/10/2022   Procedure: PERIPHERAL VASCULAR INTERVENTION;  Surgeon: Chuck Hint, MD;  Location: [facility] INVASIVE CV LAB;  Service: Cardiovascular;  Laterality: Left;  External Illiac   REVISION OF ARTERIOVENOUS GORETEX GRAFT Right 09/02/2022   Procedure: REVISION OF RIGHT ARTERIOVENOUS GORETEX GRAFT;  Surgeon: Victorino Sparrow, MD;  Location: [facility] OR;  Service: Vascular;  Laterality: Right;   TEE WITHOUT CARDIOVERSION N/A 05/13/2021   Procedure: TRANSESOPHAGEAL ECHOCARDIOGRAM (TEE);  Surgeon: Little Ishikawa, MD;  Location[facility] ENDOSCOPY;  Service: Cardiovascular;  Laterality: N/A;   TEE WITHOUT CARDIOVERSION N/A  09/07/2022   Procedure: TRANSESOPHAGEAL ECHOCARDIOGRAM (TEE);  Surgeon: Wendall Stade, MD;  Location: [facility] ENDOSCOPY;  Service: Cardiovascular;  Laterality: N/A;   THROMBECTOMY FEMORAL ARTERY Left 08/22/2022   Procedure: AORTOILIAC THROMBECTOMY, COMMON FEMORAL ENDARTERECTOMY,;  Surgeon: Maeola Harman, MD;  Location: [facility] OR;  Service: Vascular;  Laterality: Left;   VEIN HARVEST Right 09/05/2021   Procedure: RIGHT Saphenous VEIN HARVEST;  Surgeon: Chuck Hint, MD;  Location: [facility] OR;  Service: Vascular;  Laterality: Right;    Prior to Admission medications   Medication Sig Start Date End Date Taking? Authorizing Provider  albuterol (VENTOLIN HFA) 108 (90 Base) MCG/ACT inhaler INHALE 1-2 PUFFS INTO THE LUNGS EVERY 6 (SIX) HOURS AS NEEDED FOR WHEEZING OR SHORTNESS OF BREATH. 07/02/20 08/22/23  Maness, Loistine Chance, MD  aspirin EC 81 MG tablet Take 1 tablet (81 mg total) by mouth daily. Swallow whole. Patient not taking: Reported on 08/22/2022 08/10/22 08/10/23  Chuck Hint, MD  B Complex-C-Zn-Folic Acid (DIALYVITE/ZINC) TABS Take 1 tablet by mouth daily. Patient not taking: Reported on 08/22/2022 04/25/22   [provider]  calcium acetate (PHOSLO) 667 MG capsule Take 2 capsule by mouth three times a day with meals Patient taking differently: Take 1,334 mg by mouth 3 (three) times daily with meals. 06/17/21  clopidogrel (PLAVIX) 75 MG tablet Take 1 tablet (75 mg total) by mouth daily. 08/24/22 09/23/22  Jerre Simon, MD  hydrOXYzine (ATARAX) 10 MG tablet Take 10 mg by mouth 3 (three) times daily. 08/18/22   [provider]  metoprolol succinate (TOPROL-XL) 25 MG 24 hr tablet Take 1 tablet (25 mg total) by mouth daily. 07/01/22   Erick Alley, DO  rosuvastatin (CRESTOR) 10 MG tablet Take 1 tablet (10 mg total) by mouth daily. Patient not taking: Reported on 08/13/2022 08/10/22 08/10/23  Chuck Hint, MD    Current Facility-Administered Medications  Medication  Dose Route Frequency Provider Last Rate Last Admin   (feeding supplement) PROSource Plus liquid 30 mL  30 mL Oral TID BM Kc, Ramesh, MD   30 mL at 09/11/22 1754   0.9 %  sodium chloride infusion   Intra-arterial PRN Hunsucker, Lesia Sago, MD   Stopped at 09/08/22 0904   0.9 %  sodium chloride infusion  250 mL Intravenous Continuous Hunsucker, Lesia Sago, MD 20 mL/hr at 09/11/22 1900 Infusion Verify at 09/11/22 1900   0.9 %  sodium chloride infusion  500 mL Intravenous Once PRN Schuh, McKenzi P, PA-C       acetaminophen (TYLENOL) tablet 650 mg  650 mg Oral Q6H Leonie Douglas, MD   650 mg at 09/12/22 0449   alteplase (CATHFLO ACTIVASE) injection 2 mg  2 mg Intracatheter Once PRN Hunsucker, Lesia Sago, MD       aspirin EC tablet 81 mg  81 mg Oral Q0600 Schuh, McKenzi P, PA-C   81 mg at 09/12/22 0500   calcitRIOL (ROCALTROL) capsule 0.25 mcg  0.25 mcg Oral Q T,Th,Sa-HD Delano Metz, MD       calcium acetate (PHOSLO) capsule 1,334 mg  1,334 mg Oral TID WC Delano Metz, MD   1,334 mg at 09/11/22 1755   Chlorhexidine Gluconate Cloth 2 % PADS 6 each  6 each Topical Q0600 Delano Metz, MD       Darbepoetin Alfa (ARANESP) injection 100 mcg  100 mcg Subcutaneous Q Wed-1800 Hunsucker, Lesia Sago, MD   100 mcg at 09/02/22 1731   diphenhydrAMINE (BENADRYL) injection 12.5 mg  12.5 mg Intravenous Q6H PRN Leonie Douglas, MD       Or   diphenhydrAMINE (BENADRYL) 12.5 MG/5ML elixir 12.5 mg  12.5 mg Oral Q6H PRN Leonie Douglas, MD       docusate sodium (COLACE) capsule 100 mg  100 mg Oral BID PRN Hunsucker, Lesia Sago, MD       feeding supplement (NEPRO CARB STEADY) liquid 237 mL  237 mL Oral TID BM Kc, Ramesh, MD       heparin injection 1,000 Units  1,000 Units Dialysis PRN Hunsucker, Lesia Sago, MD   1,600 Units at 09/09/22 1852   hydrALAZINE (APRESOLINE) injection 10 mg  10 mg Intravenous Q4H PRN Hunsucker, Lesia Sago, MD   10 mg at 09/06/22 0749   HYDROmorphone (DILAUDID) 1 mg/mL PCA injection    Intravenous Q4H Leonie Douglas, MD   0.9 mg at 09/11/22 1600   magnesium sulfate IVPB 2 g 50 mL  2 g Intravenous Daily PRN Schuh, McKenzi P, PA-C       methocarbamol (ROBAXIN) tablet 500 mg  500 mg Oral TID Leonie Douglas, MD   500 mg at 09/11/22 2116   multivitamin (RENA-VIT) tablet 1 tablet  1 tablet Oral QHS Hunsucker, Lesia Sago, MD   1 tablet at 09/11/22 2116   naloxone [facility])  injection 0.4 mg  0.4 mg Intravenous PRN Leonie DouglasHawken, Thomas N, MD       And   sodium chloride flush (NS) 0.9 % injection 9 mL  9 mL Intravenous PRN Leonie DouglasHawken, Thomas N, MD       ondansetron [facility](ZOFRAN) injection 4 mg  4 mg Intravenous Q6H PRN Hunsucker, Lesia SagoMatthew R, MD   4 mg at 09/07/22 1358   ondansetron (ZOFRAN) injection 4 mg  4 mg Intravenous Q6H PRN Leonie DouglasHawken, Thomas N, MD       Oral care mouth rinse  15 mL Mouth Rinse PRN Hunsucker, Lesia SagoMatthew R, MD       oxyCODONE (Oxy IR/ROXICODONE) immediate release tablet 5 mg  5 mg Oral Q4H PRN Hunsucker, Lesia SagoMatthew R, MD   5 mg at 09/12/22 0448   pantoprazole (PROTONIX) EC tablet 40 mg  40 mg Oral Daily Hunsucker, Lesia SagoMatthew R, MD   40 mg at 09/11/22 [id number]   pentafluoroprop-tetrafluoroeth (GEBAUERS) aerosol 1 Application  1 Application Topical PRN Hunsucker, Lesia SagoMatthew R, MD       polyethylene glycol (MIRALAX / GLYCOLAX) packet 17 g  17 g Oral Daily PRN Hunsucker, Lesia SagoMatthew R, MD       rosuvastatin (CRESTOR) tablet 10 mg  10 mg Oral Daily Hunsucker, Lesia SagoMatthew R, MD   10 mg at 09/11/22 [id number]   sodium chloride flush (NS) 0.9 % injection 10-40 mL  10-40 mL Intracatheter Q12H Hunsucker, Lesia SagoMatthew R, MD   10 mL at 09/11/22 2117   sodium chloride flush (NS) 0.9 % injection 10-40 mL  10-40 mL Intracatheter PRN Hunsucker, Lesia SagoMatthew R, MD       tetracaine 1 % injection 10 mL  100 mg Infiltration Once Hunsucker, Lesia SagoMatthew R, MD        Allergies as of 08/28/2022 - Review Complete 08/28/2022  Allergen Reaction Noted   Benadryl [diphenhydramine] Rash 08/23/2022   Lidocaine-prilocaine Rash 08/28/2021    Family  History  Problem Relation Age of Onset   Hypertension Mother    Colon cancer Father    Stomach cancer Brother     Social History   Socioeconomic History   Marital status: Divorced    Spouse name: Not on file   Number of children: Not on file   Years of education: Not on file   Highest education level: Not on file  Occupational History   Not on file  Tobacco Use   Smoking status: Former    Packs/day: 1.00    Years: 50.00    Additional pack years: 0.00    Total pack years: 50.00    Types: Cigarettes    Quit date: 07/17/2019    Years since quitting: 3.1   Smokeless tobacco: Never  Vaping Use   Vaping Use: Never used  Substance and Sexual Activity   Alcohol use: Not Currently    Alcohol/week: 1.0 - 3.0 standard drink of alcohol    Types: 1 - 3 Cans of beer per week   Drug use: No   Sexual activity: Not Currently  Other Topics Concern   Not on file  Social History Narrative   Married   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   Social Determinants of Health   Financial Resource Strain: Low Risk  (09/12/2019)   Overall Financial Resource Strain (CARDIA)    Difficulty of Paying Living Expenses: Not hard at all  Food Insecurity: Food Insecurity Present (08/22/2022)   Hunger Vital Sign    Worried About Running Out of Food in the Last Year: Sometimes  true    Ran Out of Food in the Last Year: Sometimes true  Transportation Needs: Unmet Transportation Needs (08/22/2022)   PRAPARE - Administrator, Civil Service (Medical): Yes    Lack of Transportation (Non-Medical): No  Physical Activity: Not on file  Stress: No Stress Concern Present (09/12/2019)   [employer] of Occupational Health - Occupational Stress Questionnaire    Feeling of Stress : Not at all  Social Connections: Not on file  Intimate Partner Violence: Not At Risk (08/22/2022)   Humiliation, Afraid, Rape, and Kick questionnaire    Fear of Current or Ex-Partner: No    Emotionally Abused: No     Physically Abused: No    Sexually Abused: No    Review of Systems: Gen: Denies fever, sweats or chills. No weight loss.  CV: Denies chest pain, palpitations or edema. Resp: Denies cough, shortness of breath of hemoptysis.  GI: See HPI. GU : Anuric, on hemodialysis. MS: Neck and back pain and muscle weakness. Derm: Denies rash, itchiness, skin lesions or unhealing ulcers. Psych: Denies depression, anxiety, memory loss or confusion. Heme: + Easy bruising.  Neuro:  Denies headaches, dizziness or paresthesias. Endo:  Denies any problems with DM, thyroid or adrenal function.  Physical Exam: Vital signs in last 24 hours: Temp:  [98.2 F (36.8 C)-98.6 F (37 C)] 98.6 F (37 C) (04/05 2319) Pulse Rate:  [66-88] 85 (04/06 0700) Resp:  [7-27] 15 (04/06 0700) BP: (91-160)/(54-78) 101/58 (04/06 0700) SpO2:  [93 %-100 %] 98 % (04/06 0700) Last BM Date : 09/11/22 General: Alert 67 year old male chronically ill-appearing in no acute distress. Head:  Normocephalic and atraumatic. Eyes:  No scleral icterus. Conjunctiva pink. Ears:  Normal auditory acuity. Nose:  No deformity, discharge or lesions. Mouth: Poor dentition.  No ulcers or lesions.  Neck:  Supple. No lymphadenopathy or thyromegaly.  Lungs: Breath sounds clear throughout. No wheezes, rhonchi or crackles.  Heart: Rate and rhythm, no murmurs. Abdomen: Soft, mildly distended.  Nontender.  Scattered areas of ecchymosis.  Left lower abdominal/groin drain bulb with sanguinous drainage surrounding hematoma. Rectal: Deferred. Musculoskeletal:  Symmetrical without gross deformities.  Pulses:  Normal pulses noted. Extremities:  Without clubbing or edema. Neurologic:  Alert and  oriented x 4. No focal deficits.  Skin:  Intact without significant lesions or rashes. Psych:  Alert and cooperative. Normal mood and affect.  Intake/Output from previous day: 04/05 0701 - 04/06 0700 In: 1387.8 [P.O.:597; I.V.:394.1; Blood:396.7] Out: 170  [Drains:170] Intake/Output this shift: No intake/output data recorded.  Lab Results: Recent Labs    09/11/22 0306 09/11/22 1005 09/11/22 1545 09/11/22 2027 09/12/22 0225 09/12/22 0359  WBC 15.3*  --  16.7*  --  15.7*  --   HGB 6.5*   < > 8.3* 8.0* 7.0* 7.6*  HCT 20.1*   < > 24.9* 24.6* 21.0* 23.2*  PLT 158  --  177  --  173  --    < > = values in this interval not displayed.   BMET Recent Labs    09/11/22 0441 09/11/22 1545 09/12/22 0255  NA 136 134* 134*  K 3.6 3.9 3.9  CL 97* 98 98  CO2 23 24 23   GLUCOSE 89 111* 103*  BUN 37* 52* 64*  CREATININE 4.04* 4.75* 5.48*  CALCIUM 7.8* 7.6* 7.8*   LFT Recent Labs    09/12/22 0255  ALBUMIN 1.7*   PT/INR No results for input(s): "LABPROT", "INR" in the last 72 hours. Hepatitis Panel No  results for input(s): "HEPBSAG", "HCVAB", "HEPAIGM", "HEPBIGM" in the last 72 hours.    Studies/Results: No results found.  IMPRESSION/PLAN:  67 year old male admitted to the hospital 08/28/2022 in septic shock secondary to MRSA bacteremia from left groin wound with bleeding/retroperitoneal hematoma s/p left common iliac to profunda femoris bypass and left common iliac artery angioplasty and stent placement 09/09/2022 followed by ID on IV antibiotics.  Recurrent painless hematochezia. CTA/08/2022 showed sigmoid diverticulosis. Colonoscopy due to hematochezia during hospital admission 06/2020 identified a solitary ulcer in the rectum which was clipped, diverticulosis to the sigmoid and descending colon with large internal and external hemorrhoids.  Patient passed 2 bright red bloody bowel movements last night.  Hemoglobin dropped from 7.6- > 6.4. Two units of PRBCs ordered, to be transfused during dialysis. -NPO ? CTA to rule out diverticular bleed if brisk bleeding occurs  -Defer endoscopic evaluation recommendations/flex sig to Dr. Leonides Schanz -Continue to monitor the patient closely for active GI bleeding  Acute on chronic anemia.  Hemoglobin  7.6 -> 6.4.  Two units of PRBCs ordered, to transfuse during dialysis. -Check H&H posttransfusion and then every 6 hours x 24 hours -Transfuse for hemoglobin less than 8  ESRD on hemodialysis.  Dialysis session the bedside in process at this time.  CHF  COPD   Arnaldo Natal  09/12/2022, 11:42AM

## 2022-09-12 NOTE — Op Note (Addendum)
[facility] Patient Name: Albert Harris Procedure Date : 09/12/2022 MRN: [id number] Attending MD: Particia Lather , , [id number] Date of Birth: [birth year] CSN: [id number] Age: 67 Admit Type: Inpatient Procedure:                Flexible Sigmoidoscopy Indications:              Hematochezia, history of rectal ulcer Providers:                Madelyn Brunner" Iva Boop, RN,                            Rozetta Nunnery, Technician Referring MD:             Hospitalist team Medicines:                Monitored Anesthesia Care Complications:            No immediate complications. Estimated Blood Loss:     Estimated blood loss was minimal. Procedure:                Pre-Anesthesia Assessment:                           - Prior to the procedure, a History and Physical                            was performed, and patient medications and                            allergies were reviewed. [name] tolerance of                            previous anesthesia was also reviewed. The risks                            and benefits of the procedure and the sedation                            options and risks were discussed with the patient.                            All questions were answered, and informed consent                            was obtained. Prior Anticoagulants: The patient has                            taken no anticoagulant or antiplatelet agents. ASA                            Grade Assessment: III - A patient with severe                            systemic disease. After reviewing the risks and  benefits, the patient was deemed in satisfactory                            condition to undergo the procedure.                           After obtaining informed consent, the scope was                            passed under direct vision. The PCF-190TL ([id number]                            Olympus colonoscope was introduced  through the anus                            and advanced to the the sigmoid colon. The flexible                            sigmoidoscopy was accomplished without difficulty.                            Retroflexion was not performed due to presence of                            rectal ulcers. The patient tolerated the procedure                            well. Scope In: Scope Out: Findings:      A large amount of brown stool was found in the sigmoid colon,       interfering with visualization. Stool was also initially seen in the       rectum, but this was able to be cleared.      Multiple ulcers (5 ulcers were present, largest of which was 20 cm) were       found in the rectum. No bleeding was present. Scant amounts of old blood       noted. Impression:               - Stool in the sigmoid colon.                           - Multiple ulcers in the rectum.                           - No specimens collected. Recommendation:           - Return patient to hospital ward for ongoing care.                           - At this time, the vast majority of his stool is                            brown. No active bleeding was seen on from his  rectal ulcers.                           - I suspect that the drops in his hemoglobin are                            not likely due to his rectal ulcers. It is more                            likely that his anemia is due to blood loss from                            his retroperitoneal hematoma and blood coming from                            his JP bulb.                           - His rectal ulcers are most likely due to                            constipation. Please ensure that the patient is                            passing stools regularly.                           - Will start a bowel regimen with Miralax BID for                            him.                           - The findings and recommendations were discussed                             with the patient. Procedure Code(s):        --- Professional ---                           [phone], Sigmoidoscopy, flexible; diagnostic,                            including collection of specimen(s) by brushing or                            washing, when performed (separate procedure) Diagnosis Code(s):        --- Professional ---                           K62.6, Ulcer of anus and rectum                           K92.1, Melena (includes Hematochezia) CPT copyright 2022 [employer]. All rights reserved. The codes documented in this report  are preliminary and upon coder review may  be revised to meet current compliance requirements. Dr Particia Latherlaire Jemia Fata "Alan RipperClaire" Leonides SchanzDorsey,  09/12/2022 5:23:09 PM Number of Addenda: 0

## 2022-09-12 NOTE — Progress Notes (Addendum)
Called by nursing, patient with a single episode of rectal bleed. Heparin [place] d/c. Serial H&H's ordered.  Hemoglobin 7.0, type and screen ordered. Dr Russella Dar consulted via epic chat  06/27/20 Colonoscopy Large internal/external hemorrhoids Single large ulcer in rectum

## 2022-09-12 NOTE — Progress Notes (Signed)
[facility] Progress Note  Subjective: pt seen in ICU. Hb down 6.4. No c/o's today, no SOB, lying flat.   Vitals:   09/12/22 1215 09/12/22 1225 09/12/22 1230 09/12/22 1245  BP: (!) 92/57 96/62 98/61  (!) 88/54  Pulse: 79  80 77  Resp: 13  11 12   Temp: 97.7 F (36.5 C) 98 F (36.7 C)    TempSrc: Axillary Axillary    SpO2: 98%  100% 100%  Weight:      Height:        Exam: Constitutional: lying in bed, alert and pleasant CV: normal rate, no edema Respiratory: bilateral chest rise, no iwob Gastrointestinal: soft, nonended Psych: mood and affect appropriate  Access: Right upper extremity AVG, no bruit or thrill  L fem wound covered w/ dressing    OP HD: TTS SW  4h   350/800  64.5kg  RFA AVG= clotted/ new TDC in place   Heparin none  - last OP HD 3/14, post wt 65.9kg - venofer 50mg  IV weekly - rocaltrol 0.25 mcg po tiw - phoslo 2 ac tid - no esa, last Hb 11.0  Summary: presented 08/21/22 w/ acute pain/ ischemic L foot. Hx of L EIA stenting on 3/04, and prior RLE bypass. Pt underwent endarterectomy of the left common femoral, profundofemoral, superficial femoral and external iliac vessels with bovine pericardial patch angioplasty and a left iliofemoral and profundofemoral thromboembolectomy on 3/15. Was in ICU until 3/18 for hypotension. DC'd home on 3/18. Pt then returned on 3/22 to ED w/ N/V and unable to feel his L foot. BP's low in ED and was found to have L fem pseudoaneurysm. Had emergent CRRT for high K+. Then taken to OR 3/23 for primary repair of the distal EIA w/ hematoma evacuation. Extubated 3/24. BCx's from  admit were + for MRSA, IV vanc was started. On 3/26 was off CRRT and off pressors. R arm AVG was clotted and went for declot on 3/27 per [facility] but graft re-clotted. Pt had regular HD on 3/28 then temp cath was removed for line holiday. IR placed new TDC on 4/01 and pt had regular HD later the same day. Otherwise as below.    Assessment/ Plan: Left common  femoral artery pseudoaneurysm - sp repair of L EIA on 3/23. Went back to OR 4/3 for L CIA to profunda bypass, L CIA pta and stenting, and muscle flap (sartorious to groin).  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 ID has since signed off.  CVA: +stroke on MRI. Neuro signed off.  ESRD: TTS HD.  SP CRRT 3/23- 3/26. Had HD 3/28, then the temp cath was removed for line holiday. On 4/1 VVS placed TDC and pt is getting HD now back on TTS schedule.  Volume - min edema LE's, 2L O2, soft BP's, doubt sig vol excess Anemia: Hb 8-10 range. Likely multifactorial.  No IV iron given bacteremia. ESA ordered as darbe 100 mcg sq weekly on Wed.  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 Cont po vdra. Not getting any binders - will resume phoslo 2 ac tid as binder.  Dialysis access: AVG clotted 3/27, went for declot by VVS and then AVG re-clotted. Temp cath removed 3/28. VVS placed new RIJ TDC on 4/1. Using [facility] [place].   Vinson Moselle MD [facility] 09/12/2022, 12:55 PM  Recent Labs  Lab 09/11/22 0441 09/11/22 1005 09/11/22  1545 09/11/22 2027 09/12/22 0255 09/12/22 0359 09/12/22 0956  HGB  --    < > 8.3*   < >  --  7.6* 6.4*  ALBUMIN 1.9*  --   --   --  1.7*  --   --   CALCIUM 7.8*  --  7.6*  --  7.8*  --   --   PHOS 5.0*  --   --   --  7.6*  --   --   CREATININE 4.04*  --  4.75*  --  5.48*  --   --   K 3.6  --  3.9  --  3.9  --   --    < > = values in this interval not displayed.    No results for input(s): "IRON", "TIBC", "FERRITIN" in the last 168 hours. Inpatient medications:  (feeding supplement) PROSource Plus  30 mL Oral TID BM   sodium chloride   Intravenous Once   acetaminophen  650 mg Oral Q6H   acetaminophen  650 mg Oral Once   aspirin EC  81 mg Oral Q0600   calcitRIOL  0.25 mcg Oral Q T,Th,Sa-HD   calcium acetate  1,334 mg Oral TID WC   Chlorhexidine Gluconate Cloth  6 each Topical  Q0600   darbepoetin (ARANESP) injection - DIALYSIS  100 mcg Subcutaneous Q Wed-1800   feeding supplement (NEPRO CARB STEADY)  237 mL Oral TID BM   HYDROmorphone   Intravenous Q4H   methocarbamol  500 mg Oral TID   multivitamin  1 tablet Oral QHS   pantoprazole  40 mg Oral Daily   rosuvastatin  10 mg Oral Daily   sodium chloride flush  10-40 mL Intracatheter Q12H   tetracaine  100 mg Infiltration Once    sodium chloride Stopped (09/08/22 0904)   sodium chloride 20 mL/hr at 09/11/22 1900   sodium chloride     DAPTOmycin (CUBICIN) 600 mg in sodium chloride 0.9 % IVPB     magnesium sulfate bolus IVPB     Place/Maintain arterial line **AND** sodium chloride, sodium chloride, alteplase, diphenhydrAMINE **OR** diphenhydrAMINE, docusate sodium, heparin, hydrALAZINE, magnesium sulfate bolus IVPB, naloxone **AND** sodium chloride flush, ondansetron (ZOFRAN) IV, ondansetron (ZOFRAN) IV, mouth rinse, oxyCODONE, pentafluoroprop-tetrafluoroeth, polyethylene glycol, sodium chloride flush

## 2022-09-12 NOTE — Progress Notes (Signed)
Critical hemoglobin called by Tacey Ruiz in lab. Dr Dayna Barker notified. Orders received to transfer 2 units PRBC's with hemodialysis.

## 2022-09-12 NOTE — Anesthesia Preprocedure Evaluation (Addendum)
Anesthesia Evaluation  Patient identified by MRN, date of birth, ID band Patient awake    Reviewed: Allergy & Precautions, H&P , NPO status , Patient's Chart, lab work & pertinent test results  Airway Mallampati: II  TM Distance: >3 FB Neck ROM: Full    Dental no notable dental hx.    Pulmonary asthma , COPD, former smoker   Pulmonary exam normal breath sounds clear to auscultation       Cardiovascular hypertension, + Peripheral Vascular Disease  + Valvular Problems/Murmurs AS  Rhythm:Regular Rate:Normal + Systolic murmurs . No vegetations or evidence of SBE.   2. Left ventricular ejection fraction, by estimation, is 50 to 55%. The  left ventricle has low normal function. There is severe left ventricular  hypertrophy.   3. Right ventricular systolic function is normal. The right ventricular  size is normal.   4. Left atrial size was moderately dilated. No left atrial/left atrial  appendage thrombus was detected.   5. Right atrial size was moderately dilated.   6. The mitral valve is abnormal. Mild mitral valve regurgitation.   7. The aortic valve is tricuspid. There is moderate calcification of the  aortic valve. There is moderate thickening of the aortic valve. Aortic  valve regurgitation is not visualized. Mild aortic valve stenosis.     Neuro/Psych TIACVA  negative psych ROS   GI/Hepatic ,GERD  ,,(+)     substance abuse  alcohol use  Endo/Other  negative endocrine ROS    Renal/GU DialysisRenal diseaseDialyzed today prior to flex sig  negative genitourinary   Musculoskeletal negative musculoskeletal ROS (+)    Abdominal   Peds negative pediatric ROS (+)  Hematology  (+) Blood dyscrasia, anemia   Anesthesia Other Findings   Reproductive/Obstetrics negative OB ROS                             Anesthesia Physical Anesthesia Plan  ASA: 4  Anesthesia Plan: MAC   Post-op Pain  Management: Minimal or no pain anticipated   Induction: Intravenous  PONV Risk Score and Plan: 1 and Propofol infusion and Treatment may vary due to age or medical condition  Airway Management Planned: Simple Face Mask  Additional Equipment:   Intra-op Plan:   Post-operative Plan:   Informed Consent: I have reviewed the patients History and Physical, chart, labs and discussed the procedure including the risks, benefits and alternatives for the proposed anesthesia with the patient or authorized representative who has indicated his/her understanding and acceptance.     Dental advisory given  Plan Discussed with: CRNA and Surgeon  Anesthesia Plan Comments: (Transfused 2units  PRBC's during dialysis)       Anesthesia Quick Evaluation

## 2022-09-12 NOTE — Progress Notes (Signed)
PROGRESS NOTE FABIOLA MCKETHAN  STM:[id number] DOB: 1955/10/31 DOA: 08/28/2022 PCP: [name], NP  Brief Narrative/Hospital Course: 67 year old male with history of ESRD on HD TTS, PVD,COPD, essential hypertension, chronic systolic congestive heart failure with ejection fraction 35 to 40%, presented with abdominal pain nausea and diaphoresis.Patient was also hypotensive with a lactic acidosis and was admitted to ICU,with a pulseless left lower extremity was found to have femoral pseudoaneurysm.Culture came back with MRSA bacteremia-TEE neg for .   Events: 08/29/22: -Admitted to ICU-on vasopressors for septic shock -s/p OR with Dr. Karin Lieu for left femoral exploration with pseudoaneurysm repair,hematoma evacuation>showed dehiscence at proximal aspect of bovine pericardial patch at the suture line.  -Emergent CRRT for severe hyperkalemia  09/08/22 Transferred to [facility] service  09/09/22 CTA for bleeding on left groin: showed Thrombosis of LEFT CFA pseudoaneurysm with new 3.5 cm filling defect, consistent with arterial thrombus, extending from the PSA neck and into the distal CFA. This is at risk for distal embolization and potential acute LEFT lower extremity ischemia.New 8 mm distal LEFT EIA pseudoaneurysm, medial to the stent distal landing zone. Mild interval enlargement of retroperitoneal hematoma, in a craniocaudal dimension measuring 12.2 cm (previously 11.2 cm).No CTA evidence of active extravasation. Patent RIGHT femoral-to-popliteal graft and bilateral iliac stents.Severe burden of splanchnic arterial atherosclerosis, at risk for chronic mesenteric ischemia. 09/09/22: Emergent OR:S/P left common iliac to profunda femoris bypass with ipsilateral nonreversed greater saphenous vein in anatomic tunnel, left common iliac artery angioplasty and stenting, left greater saphenous vein harvest, left sartorius muscle of left groin, left lower extremity angiography with first-order cannulation Transferred to  2H post op 09/12/22 am: 2 BRBPR   Subjective: Seen and examined, Overnight vitals/labs/events reviewed  Daughter at bedside Getting HD in room Has no complaints-denies chest pain fever chills, nausea or vomiting Overnight 2 BRBPR Left foot cold w/ some mottling  Assessment and Plan:  Septic shock with MRSA bacteremia-Initially admitted to ICU on vasopressors>shock resolved Left Groin wound infection Right groin hematoma with a left common femoral artery pseudoaneurysm Left groin bleeding/3 AM and CTA showed arterial thrombus, new distal left EIA pseudoaneurysm (see report for more) S/P Urgent OR 09/09/22 and underwent bypass and stenting:s/p left common iliac to profunda femoris bypass with ipsilateral nonreversed greater saphenous vein in anatomic tunnel, left common iliac artery angioplasty and stenting, left greater saphenous vein harvest, left sartorius muscle of left groin, left lower extremity angiography with first-order cannulation OR culture 4/3 MRSA and E Fecium> ID following vancomycin changed to daptomycin. Continue aspirin, Crestor 10.  Monitor distal flow, patient at high risk for left leg amputation-left wound VAC and groin in place left foot is unchanged with some mottling and has ischemia with the peroneal signal following vascular surgery following closely.  PVD with history of left external iliac artery and common artery stenting and right femoropopliteal bypass with left common femoral artery pseudoaneurysm on CT 3/22, status post left femoral artery pseudoaneurysm repair 3/22, S/P AVG thrombectomy attempted but patient became hemodynamically unstable and procedure was aborted, followed by vascular  s/p OR with surgery as above on 09/09/22.see above.  Retroperitoneal hemorrhage on CTA 3/22, on 3/30 CT abdomen intrapelvic asked retroperitoneal hematoma within the prevesical space, right greater than left extension of hemorrhage within the extraperitoneal and lateral pelvic walls.  CT 09/09/22: Mild interval enlargement of retroperitoneal hematoma, in a craniocaudal dimension measuring 12.2 cm (previously 11.2 cm).No CTA evidence of active extravasation Monitor. Plan as per   Acute respiratory failure with hypoxemia.  COPD: Cont IS, prn inhalers.  Continue supplemental oxygen,  Chronic CHF with preserved EF-based on echo from 4/1 Pulmonary hypertension: TEE 4/1 showed EF 50-55%,severe LVH, no left atrial thrombus detected.  Continue to manage fluid with dialysis per nephrology. Net IO Since Admission: 7,581.61 mL [09/12/22 1009]   ESRD on HD TTS: nephrology following getting dialysis 4/6. AVG clotted on 3/27- s/p temp catheter removed 3/28 IR placed new TDC on 4/1.  Hyponatremia/hypokalemia/hyperkalemia/ metabolic acidosis: resolved-monitoring electrolytes closely. Recent Labs  Lab 09/07/22 0434 09/08/22 0329 09/09/22 0411 09/09/22 1209 09/10/22 0627 09/10/22 2024 09/11/22 0306 09/11/22 0441 09/11/22 1545 09/12/22 0255  K 4.5 4.0 4.2   < > 6.0* 3.2* 3.2* 3.6 3.9 3.9  CALCIUM 8.3* 8.3* 8.3*   < > 7.8* 7.5* 7.1* 7.8* 7.6* 7.8*  MG 2.4  --   --   --   --   --   --   --   --   --   PHOS 7.8* 4.4 6.3*  --  9.0*  --   --  5.0*  --  7.6*   < > = values in this interval not displayed.    Acute blood loss anemia Anemia of chronic kidney disease: Patient needing multiple blood transfusions in the setting of operative intervention-600 cc of blood loss during surgery, retroperitoneal bleed pseudoaneurysm/left groin bleeding/with wound VAC w/ drain in place.  Continue to check H&H serially and transfuse if drifting Recent Labs  Lab 09/11/22 1005 09/11/22 1545 09/11/22 2027 09/12/22 0225 09/12/22 0359  HGB 8.6* 8.3* 8.0* 7.0* 7.6*  HCT 26.6* 24.9* 24.6* 21.0* 23.2*    Stroke/left leg weakness with right small external capsule infarct likely secondary to small vessel disease noted on MRI 2D echo showed EF 45% but improved on TEE, carotid Dopplers bilateral ICA 40  to 59% stenosis, MRI motion degraded right AVG irregular appearance, LDL 45 HbA1c 5.1 seen by neuro surgery, on Plavix PTA.  Now on aspirin 81 and Crestor continue the same.  HLD: Continue Crestor  History of hypertension with transient hypotension on 3/30 was managed with IV fluid bolus, not needing antihypertensives now-monitor.   Poor oral intake: Dietitian following Moderate protein calorie malnutrition augment diet as tolerated  Nutrition Problem: Moderate Malnutrition (in the context of chronic illness (ESRD)) Etiology: poor appetite Signs/Symptoms: moderate fat depletion, severe muscle depletion Interventions: Refer to RD note for recommendations, Liberalize Diet, Nepro shake, MVI  Episodes of bright red bleeding per rectum 4/5 night: Heparin has been discontinued, GI has been consulted, history of colonoscopy in 2022 rectal ulcer hemorrhoids.  Monitor H&H.  Pressure injury of skin on coccyx stage II POA, wound care   Goals of care: Currently full scope of treatment, prognosis remains to be seen at this time-continues to have ongoing significant medical conditions, left leg at risk of amputation.  Daughter updated at the bedside again and aware about overall serious medical status Discussed with nursing staff  DVT prophylaxis: SCD's Start: 09/09/22 1841 Code Status:   Code Status: Full Code Family Communication: plan of care discussed with patient at bedside.Patient status is: Patient because of left leg ischemia Level of care: ICU  Dispo: The patient is from: home lives with wife            Anticipated disposition:TBD Objective: Vitals last 24 hrs: Vitals:   09/12/22 0915 09/12/22 0918 09/12/22 0920 09/12/22 0930  BP: (!) 90/52  (!) 90/52 105/63  Pulse: 74  80 75  Resp: 14  12 13  Temp:   97.6 F (36.4 C)   TempSrc:   Axillary   SpO2: 100%  100% 100%  Weight:  72 kg    Height:       Weight change:   Physical Examination: General exam: AA O X.3, weak,older  appearing HEENT:Oral mucosa moist, Ear/Nose WNL grossly, dentition normal. Respiratory system: bilaterally clear BS,no use of accessory muscle Cardiovascular system: S1 & S2 +, regular rate. Gastrointestinal system: Abdomen soft, NT,ND,BS+ Nervous System:Alert, awake, moving extremities and grossly nonfocal Extremities: LE ankle edema neg, left groin with wound VAC present, left foot appears cold dark-colored, small blackened wound on the right foot dorsum. Skin: No rashes,no icterus. MSK: Normal muscle bulk,tone, power  Chest w/ HD catheter dressing intact  Medications reviewed:  Scheduled Meds:  (feeding supplement) PROSource Plus  30 mL Oral TID BM   acetaminophen  650 mg Oral Q6H   aspirin EC  81 mg Oral Q0600   calcitRIOL       calcitRIOL  0.25 mcg Oral Q T,Th,Sa-HD   calcium acetate  1,334 mg Oral TID WC   Chlorhexidine Gluconate Cloth  6 each Topical Q0600   darbepoetin (ARANESP) injection - DIALYSIS  100 mcg Subcutaneous Q Wed-1800   feeding supplement (NEPRO CARB STEADY)  237 mL Oral TID BM   HYDROmorphone   Intravenous Q4H   methocarbamol  500 mg Oral TID   multivitamin  1 tablet Oral QHS   pantoprazole  40 mg Oral Daily   rosuvastatin  10 mg Oral Daily   sodium chloride flush  10-40 mL Intracatheter Q12H   tetracaine  100 mg Infiltration Once  Continuous Infusions:  sodium chloride Stopped (09/08/22 0904)   sodium chloride 20 mL/hr at 09/11/22 1900   sodium chloride     magnesium sulfate bolus IVPB     Diet Order             Diet NPO time specified Except for: Ice Chips, Sips with Meds  Diet effective now                   Intake/Output Summary (Last 24 hours) at 09/12/2022 1003 Last data filed at 09/12/2022 0600 Gross per 24 hour  Intake 604.08 ml  Output 170 ml  Net 434.08 ml   Net IO Since Admission: 7,581.61 mL [09/12/22 1003]  Wt Readings from Last 3 Encounters:  09/12/22 72 kg  08/23/22 64.1 kg  08/15/22 64.9 kg     Unresulted Labs (From  admission, onward)     Start     Ordered   09/12/22 0341  Hemoglobin and hematocrit, blood  Now then every 6 hours,   R (with TIMED occurrences)     Question:  Specimen collection method  Answer:  Lab=Lab collect   09/12/22 0340   09/11/22 2100  Hemoglobin and hematocrit, blood  Now then every 6 hours,   R     Question:  Specimen collection method  Answer:  Lab=Lab collect   09/11/22 0827   09/11/22 1442  Vitamin C  Once,   R       Question:  Specimen collection method  Answer:  Lab=Lab collect   09/11/22 1441   09/11/22 1442  Vitamin A  Once,   R       Question:  Specimen collection method  Answer:  Lab=Lab collect   09/11/22 1441   09/11/22 1442  Zinc  Once,   R       Question:  Specimen collection  method  Answer:  Lab=Lab collect   09/11/22 1441   09/10/22 1500  Basic metabolic panel  2 times daily,   R     Question:  Specimen collection method  Answer:  Lab=Lab collect   09/10/22 0736   09/10/22 0737  CBC  2 times daily,   R     Question:  Specimen collection method  Answer:  Lab=Lab collect   09/10/22 0736   09/10/22 0500  CBC  Daily,   R     Question:  Specimen collection method  Answer:  Lab=Lab collect   09/09/22 1411   08/29/22 0500  Renal function panel (daily at 0500)  Daily,   R      08/28/22 2020          Data Reviewed: I have personally reviewed following labs and imaging studies CBC: Recent Labs  Lab 09/10/22 0627 09/10/22 1451 09/11/22 0306 09/11/22 1005 09/11/22 1545 09/11/22 2027 09/12/22 0225 09/12/22 0359  WBC 16.6* 14.0* 15.3*  --  16.7*  --  15.7*  --   HGB 8.0* 7.6* 6.5* 8.6* 8.3* 8.0* 7.0* 7.6*  HCT 24.9* 22.6* 20.1* 26.6* 24.9* 24.6* 21.0* 23.2*  MCV 95.4 93.4 96.6  --  93.3  --  93.3  --   PLT 179 177 158  --  177  --  173  --    Basic Metabolic Panel: Recent Labs  Lab 09/07/22 0434 09/08/22 0329 09/09/22 0411 09/09/22 1209 09/10/22 0627 09/10/22 2024 09/11/22 0306 09/11/22 0441 09/11/22 1545 09/12/22 0255  NA 129* 134* 136    < > 136 135 135 136 134* 134*  K 4.5 4.0 4.2   < > 6.0* 3.2* 3.2* 3.6 3.9 3.9  CL 90* 95* 98   < > 98 96* 102 97* 98 98  CO2 19* 23 23   < > 21* 26 23 23 24 23   GLUCOSE 98 235* 120*   < > 112* 100* 88 89 111* 103*  BUN 100* 43* 80*   < > 72* 32* 36* 37* 52* 64*  CREATININE 8.70* 4.77* 6.51*   < > 5.91* 3.27* 3.54* 4.04* 4.75* 5.48*  CALCIUM 8.3* 8.3* 8.3*   < > 7.8* 7.5* 7.1* 7.8* 7.6* 7.8*  MG 2.4  --   --   --   --   --   --   --   --   --   PHOS 7.8* 4.4 6.3*  --  9.0*  --   --  5.0*  --  7.6*   < > = values in this interval not displayed.   GFR: Estimated Creatinine Clearance: 12.5 mL/min (A) (by C-G formula based on SCr of 5.48 mg/dL (H)). Liver Function Tests: Recent Labs  Lab 09/08/22 0329 09/09/22 0411 09/10/22 0627 09/11/22 0441 09/12/22 0255  ALBUMIN 2.1* 1.9* 2.2* 1.9* 1.7*  CBG: Recent Labs  Lab 09/09/22 0808 09/11/22 2122 09/12/22 0646  GLUCAP 114* 111* 112*   No results for input(s): "PROCALCITON", "LATICACIDVEN" in the last 168 hours.   Recent Results (from the past 240 hour(s))  Aerobic/Anaerobic Culture w Gram Stain (surgical/deep wound)     Status: None   Collection Time: 09/02/22  2:06 PM   Specimen: Wound  Result Value Ref Range Status   Specimen Description WOUND  Final   Special Requests AV GRAFT THROMBUS  Final   Gram Stain NO WBC SEEN NO ORGANISMS SEEN   Final   Culture   Final    No growth  aerobically or anaerobically. Performed at [facility], [address], [place], [place] [address]    Report Status 09/08/2022 FINAL  Final  Culture, blood (Routine X 2) w Reflex to ID Panel     Status: None   Collection Time: 09/02/22  4:44 PM   Specimen: BLOOD LEFT HAND  Result Value Ref Range Status   Specimen Description BLOOD LEFT HAND  Final   Special Requests   Final    BOTTLES DRAWN AEROBIC AND ANAEROBIC Blood Culture results may not be optimal due to an inadequate volume of blood received in culture bottles   Culture   Final    NO  GROWTH 5 DAYS Performed at [facility], [address], [place], [place] [address]    Report Status 09/07/2022 FINAL  Final  Culture, blood (Routine X 2) w Reflex to ID Panel     Status: None   Collection Time: 09/02/22  4:45 PM   Specimen: BLOOD LEFT HAND  Result Value Ref Range Status   Specimen Description BLOOD LEFT HAND  Final   Special Requests   Final    BOTTLES DRAWN AEROBIC AND ANAEROBIC Blood Culture adequate volume   Culture   Final    NO GROWTH 5 DAYS Performed at [facility], [address], [place], [place] [address]    Report Status 09/07/2022 FINAL  Final  Surgical pcr screen     Status: None   Collection Time: 09/09/22 11:16 AM   Specimen: Nasal Mucosa; Nasal Swab  Result Value Ref Range Status   MRSA, PCR NEGATIVE NEGATIVE Final   Staphylococcus aureus NEGATIVE NEGATIVE Final    Comment: (NOTE) The Xpert SA Assay (FDA approved for NASAL specimens in patients 47 years of age and older), is one component of a comprehensive surveillance program. It is not intended to diagnose infection nor to guide or monitor treatment. Performed at [facility], [address] [place], [place] [address]   Aerobic/Anaerobic Culture w Gram Stain (surgical/deep wound)     Status: None (Preliminary result)   Collection Time: 09/09/22  1:28 PM   Specimen: Groin, Left; Wound  Result Value Ref Range Status   Specimen Description GROIN LEFT  Final   Special Requests LEFT GROIN HEMATOMA PT ON VANC ANCEF  Final   Gram Stain   Final    ABUNDANT WBC PRESENT, PREDOMINANTLY PMN RARE GRAM POSITIVE COCCI IN PAIRS Performed at [facility], [address], [place], [place] [address]    Culture   Final    RARE STAPHYLOCOCCUS AUREUS FEW ENTEROCOCCUS FAECIUM SUSCEPTIBILITIES TO FOLLOW NO ANAEROBES ISOLATED; CULTURE IN PROGRESS FOR 5 DAYS    Report Status PENDING  Incomplete  Aerobic/Anaerobic Culture w Gram Stain (surgical/deep wound)     Status: None  (Preliminary result)   Collection Time: 09/09/22  1:28 PM   Specimen: Groin, Left; Wound  Result Value Ref Range Status   Specimen Description TISSUE LEFT GROIN  Final   Special Requests PT ON VANC ANCEF LEFT HEMATOMA  Final   Gram Stain   Final    ABUNDANT WBC PRESENT, PREDOMINANTLY PMN RARE GRAM POSITIVE COCCI IN PAIRS Performed at [facility], [address] [place], [place] [address]    Culture   Final    RARE STAPHYLOCOCCUS AUREUS RARE ENTEROCOCCUS FAECIUM SUSCEPTIBILITIES TO FOLLOW NO ANAEROBES ISOLATED; CULTURE IN PROGRESS FOR 5 DAYS    Report Status PENDING  Incomplete  Aerobic/Anaerobic Culture w  Gram Stain (surgical/deep wound)     Status: None (Preliminary result)   Collection Time: 09/09/22  2:48 PM   Specimen: PATH Other; Tissue  Result Value Ref Range Status   Specimen Description WOUND  Final   Special Requests LEFT COMMON ILIAC STENTS PT ON VANC ANCEF  Final   Gram Stain   Final    ABUNDANT WBC PRESENT,BOTH PMN AND MONONUCLEAR FEW GRAM POSITIVE COCCI IN PAIRS Performed at [facility], [address] [place], [place] [address]    Culture   Final    ABUNDANT METHICILLIN RESISTANT STAPHYLOCOCCUS AUREUS NO ANAEROBES ISOLATED; CULTURE IN PROGRESS FOR 5 DAYS    Report Status PENDING  Incomplete   Organism ID, Bacteria METHICILLIN RESISTANT STAPHYLOCOCCUS AUREUS  Final      Susceptibility   Methicillin resistant staphylococcus aureus - MIC*    CIPROFLOXACIN 1 SENSITIVE Sensitive     ERYTHROMYCIN >=8 RESISTANT Resistant     GENTAMICIN <=0.5 SENSITIVE Sensitive     OXACILLIN >=4 RESISTANT Resistant     TETRACYCLINE <=1 SENSITIVE Sensitive     VANCOMYCIN 1 SENSITIVE Sensitive     TRIMETH/SULFA <=10 SENSITIVE Sensitive     CLINDAMYCIN <=0.25 SENSITIVE Sensitive     RIFAMPIN <=0.5 SENSITIVE Sensitive     Inducible Clindamycin NEGATIVE Sensitive     * ABUNDANT METHICILLIN RESISTANT STAPHYLOCOCCUS AUREUS  Aerobic/Anaerobic Culture w Gram Stain  (surgical/deep wound)     Status: None (Preliminary result)   Collection Time: 09/09/22  2:53 PM   Specimen: PATH Vessel; Tissue  Result Value Ref Range Status   Specimen Description TISSUE  Final   Special Requests VESSEL PT ON VANC ANCEF  Final   Gram Stain   Final    NO WBC SEEN RARE GRAM POSITIVE COCCI Performed at [facility], [address] [place], [place] [address]    Culture   Final    RARE STAPHYLOCOCCUS AUREUS NO ANAEROBES ISOLATED; CULTURE IN PROGRESS FOR 5 DAYS    Report Status PENDING  Incomplete    Antimicrobials: Anti-infectives (From admission, onward)    Start     Dose/Rate Route Frequency Ordered Stop   09/11/22 1615  DAPTOmycin (CUBICIN) 600 mg in sodium chloride 0.9 % IVPB        600 mg 124 mL/hr over 30 Minutes Intravenous  Once 09/11/22 1522 09/11/22 2042   09/10/22 1200  vancomycin (VANCOREADY) IVPB 500 mg/100 mL  Status:  Discontinued        500 mg 100 mL/hr over 60 Minutes Intravenous Every T-Th-Sa (Hemodialysis) 09/09/22 0719 09/10/22 0719   09/10/22 0815  vancomycin (VANCOREADY) IVPB 750 mg/150 mL        750 mg 150 mL/hr over 60 Minutes Intravenous  Once 09/10/22 0719 09/10/22 0939   09/10/22 0718  vancomycin variable dose per unstable renal function (pharmacist dosing)  Status:  Discontinued         Does not apply See admin instructions 09/10/22 0719 09/11/22 1522   09/09/22 2000  piperacillin-tazobactam (ZOSYN) IVPB 2.25 g  Status:  Discontinued        2.25 g 100 mL/hr over 30 Minutes Intravenous Every 8 hours 09/09/22 1849 09/10/22 1003   09/09/22 1145  ceFAZolin (ANCEF) 2-4 GM/100ML-% IVPB       Note to Pharmacy: Payton Emerald A: cabinet override      09/09/22 1145 09/09/22 2359   09/08/22 0830  vancomycin (VANCOREADY) IVPB 750 mg/150 mL        750 mg  150 mL/hr over 60 Minutes Intravenous  Once 09/08/22 0743 09/08/22 1004   09/07/22 1200  ceFAZolin (ANCEF) IVPB 2g/100 mL premix       Note to Pharmacy: Give in IR for surgical  prophylaxis   2 g 200 mL/hr over 30 Minutes Intravenous  Once 09/07/22 1100 09/07/22 1327   09/07/22 1101  ceFAZolin (ANCEF) 2-4 GM/100ML-% IVPB       Note to Pharmacy: Phebe Colla N: cabinet override      09/07/22 1101 09/07/22 1335   09/07/22 0734  vancomycin variable dose per unstable renal function (pharmacist dosing)  Status:  Discontinued         Does not apply See admin instructions 09/07/22 0734 09/09/22 0718   09/05/22 1600  vancomycin (VANCOREADY) IVPB 750 mg/150 mL        750 mg 150 mL/hr over 60 Minutes Intravenous  Once 09/05/22 1505 09/05/22 1717   09/02/22 1227  ceFAZolin (ANCEF) 2-4 GM/100ML-% IVPB       Note to Pharmacy: Isabel Caprice, Destiny: cabinet override      09/02/22 1227 09/03/22 0044   09/02/22 0520  vancomycin variable dose per unstable renal function (pharmacist dosing)  Status:  Discontinued         Does not apply See admin instructions 09/02/22 0521 09/05/22 0940   08/31/22 1800  vancomycin (VANCOREADY) IVPB 750 mg/150 mL  Status:  Discontinued        750 mg 150 mL/hr over 60 Minutes Intravenous Every 24 hours 08/31/22 0822 09/02/22 0521   08/30/22 1800  vancomycin (VANCOREADY) IVPB 1500 mg/300 mL        1,500 mg 150 mL/hr over 120 Minutes Intravenous STAT 08/30/22 1739 08/30/22 2139      Culture/Microbiology    Component Value Date/Time   SDES TISSUE 09/09/2022 1453   SPECREQUEST VESSEL PT ON VANC ANCEF 09/09/2022 1453   CULT  09/09/2022 1453    RARE STAPHYLOCOCCUS AUREUS NO ANAEROBES ISOLATED; CULTURE IN PROGRESS FOR 5 DAYS    REPTSTATUS PENDING 09/09/2022 1453  Other culture-see note  Radiology Studies: No results found.   LOS: 15 days   Lanae Boast, MD [facility]  09/12/2022, 10:03 AM

## 2022-09-12 NOTE — Progress Notes (Signed)
Received patient in bed in 2 H02 Alert and oriented.  Informed consent signed and in chart.   TX duration:3.5 hrs  Patient tolerated well.  without acute distress.  Hand-off given to patient's nurse.   Access used: catheter Access issues: lines reversed  Total UF removed: 200 ml Medication(s) given: 2 units PRBC  given. Post HD VS: 100/60. HR 72 R 12. O2 sat 100 % Post HD weight: 72.4 kg   [facility]

## 2022-09-12 NOTE — Progress Notes (Signed)
Pharmacy Antibiotic Note  Albert Harris is a 67 y.o. 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 Now with MRSA bacteremia. Noted patient is ESRD HD TTS PTA, with a right arm AV graft and a catheter in his right IJ. Pharmacy consulted to dose daptomycin due to enterococcus faecium and staph aureus growing from tissue cultures from 4/3.  iHD planned for today, will plan to give daptomycin this evening.  Plan: Daptomycin 600mg  IV x 1 dose today Will follow HD schedule for further dosing  Height: 5\' 8"  (172.7 cm) Weight: 72 kg (158 lb 11.7 oz) IBW/kg (Calculated) : 68.4  Temp (24hrs), Avg:98.2 F (36.8 C), Min:97.6 F (36.4 C), Max:98.6 F (37 C)  Recent Labs  Lab 09/05/22 1405 09/05/22 1919 09/10/22 0627 09/10/22 1451 09/10/22 2024 09/11/22 0306 09/11/22 0441 09/11/22 1545 09/12/22 0225 09/12/22 0255  WBC 19.7*   < > 16.6* 14.0*  --  15.3*  --  16.7* 15.7*  --   CREATININE  --    < > 5.91*  --  3.27* 3.54* 4.04* 4.75*  --  5.48*  VANCORANDOM 14  --   --   --   --   --   --   --   --   --    < > = values in this interval not displayed.     Estimated Creatinine Clearance: 12.5 mL/min (A) (by C-G formula based on SCr of 5.48 mg/dL (H)).    Allergies  Allergen Reactions   Benadryl [Diphenhydramine] Rash   Lidocaine-Prilocaine Rash    Caused red marks up and down arm   Antimicrobials this admission:  Cefazolin 3/23 x 1 Vancomycin 3/24 >> 4/5 Zosyn 4/3 >>4/3 Daptomycin 4/5>>  Microbiology results:  4/3 L groin wound: staph aureus, enterococcus faecium sensitivity pending 4/3 MRSA PCR: neg 3/27 Bcx: neg 3/25 Bcx: MRSA 3/23 Bcx: MRSA    Fredonia Highland, PharmD, BCPS, [facility] Clinical Pharmacist [phone] Please check AMION for all [facility] Pharmacy numbers 09/12/2022

## 2022-09-12 NOTE — Consult Note (Addendum)
 Referring Provider: Dr. Debbie Crosley Primary Care Physician:  Edwards, Michelle P, NP Primary Gastroenterologist: Unassigned  Reason for Consultation: Bloody stools  HPI: Albert Harris is a 67 y.o. male with a past medical history of hypertension, hyperlipidemia, peripheral vascular disease s/p right femoropopliteal bypass 08/2021, carotid artery stenosis, EVA, PFO, CHF with LVEF 50 - 55% per TEE 09/2022, ESRD on HD,  C.diff 06/2022, alcohol use disorder, lower GI bleed secondary to rectal solitary ulcer 2022. 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  Patient was admitted to the hospital 08/28/2022 with septic shock secondary to MRSA bacteremia from left groin wound with bleeding s/p left common iliac to profunda femoris bypass and left common iliac artery angioplasty and stent placement followed by ID on IV antibiotics.  A GI consult was requested after the patient passed 2 bloody bowel movements overnight.  Hemoglobin 4 days ago was 8 -> 7.6 -> Hg 6.5 on 4/5 -> transfused 1 unit of PRBCs -> Hg 8.6 -> 8.0 -> today Hg 7.0.  Subcu heparin held.  Labs today WBC 15.7 down from 16.7.  Hemoglobin 7.0 -> 6.4.  HCT 21.  Platelet 173.  BUN 64.  Creatinine 5.48.  Albumin 1.7.  2 units of PRBCs ordered, to transfuse during dialysis.  His RN reported he passed 2 bright red bloody bowel movements last night without recurrence thus far today.  He denies having any nausea, vomiting or abdominal pain.  No rectal pain.  Prior to this hospitalization, he reported his prior diarrhea abated and he denied having any issues with constipation.    PRIOR IMAGE STUDIES:  CTA 09/09/2022: FINDINGS: VASCULAR   Aorta: Severe burden of aortic calcified and calcified atherosclerosis. No aneurysm, dissection, vasculitis or significant stenosis.   Celiac: Calcified ostial atherosclerosis with short segment proximal celiac  artery stenosis, approximately 50%, with mild post stenotic dilatation. Well opacified celiac axis. No evidence of aneurysm, dissection or vasculitis.   SMA: Calcified noncalcified ostial atherosclerosis with 2.9 cm segment of up to severe proximal SMA stenosis. No evidence of aneurysm, dissection or vasculitis.   Renals: Single renal arteries are present bilaterally. Severe bilateral renal artery stenoses, greater on LEFT with at least 2 cm proximal occlusion. Severe burden of bilateral calcified atherosclerosis of the segmental and interlobar arteries. No aneurysm or dissection.   IMA: Severe burden of ostial atherosclerosis, otherwise distally patent. No evidence of aneurysm, dissection or vasculitis.   RIGHT Lower Extremity   Inflow: Patent RIGHT CIA and EIA overlapping stents. Occluded RIGHT hypogastric artery. No evidence of aneurysm, dissection or vasculitis.   Outflow: Occluded RIGHT native SFA with short segment retrograde reconstitution from the TP trunk to the level of the adductor hiatus. RIGHT femoral-to-popliteal bypass, widely patent. Diseased but patent RIGHT profunda femoris. No evidence of aneurysm or dissection.   Runoff: Severely diseased but patent, at least two-vessel, runoff to the ankle.   LEFT Lower Extremity   Inflow: Patent LEFT CIA and EIA overlapping stents. Occluded LEFT hypogastric artery. New pseudoaneurysm at the distal LEFT EIA, measuring 8 mm and medial to the stent distal landing zone. See key image. No aneurysm or dissection.   Outflow:   *Near-complete thrombosis of prior pseudoaneurysm at the LEFT CFA pseudoaneurysm. *New linear filling defect extending from the pseudoaneurysm neck and extending peripherally into the distal CFA, measuring up to 3.5 cm. *Patent LEFT profunda femoris, with occluded LEFT SFA along its entirety. Distal reconstitution   of the popliteal artery with diseased but patent trifurcation. *No evidence of  aneurysm or dissection.   Runoff: Severely diseased but patent, at least two-vessel, runoff to the ankle.   Veins: No obvious venous abnormality within the limitations of this arterial phase study.   Review of the MIP images confirms the above findings.   NON-VASCULAR   Lower chest: No acute abnormality. Calcified multivessel coronary atherosclerosis. Nodular opacities at the lung bases, greater on RIGHT measuring 1.4 cm, similar in appearance to 09/05/2022 comparison though new since 08/12/2022.   Hepatobiliary: No focal liver abnormality is seen. No gallstones, gallbladder wall thickening, or biliary dilatation.   Pancreas: No pancreatic ductal dilatation or surrounding inflammatory changes.   Spleen: Normal in size without focal abnormality. Perihilar accessory spleen, abutting the tail of pancreas and measuring 2.5 cm   Adrenals/Urinary Tract: Adrenal glands are unremarkable. Atrophic kidneys. RIGHT perirenal stranding. No renal calculi, focal lesion, or hydronephrosis. Bladder is decompressed.   Stomach/Bowel: Stomach is within normal limits. Appendix appears normal. Moderate burden of sigmoid diverticulosis. No evidence of bowel wall thickening, distention, or inflammatory changes.   Lymphatic: No enlarged abdominal or pelvic lymph nodes.   Reproductive: Prostate is borderline enlarged, otherwise unremarkable.   Other:   *No abdominal wall hernia. No abdominopelvic ascites. *Retroperitoneal hematoma, extending cephalad along the rectus sheath, and anterior inferiorly to the retropubic and LEFT anterior pelvic spaces, measuring up to 6.4 x 12.2 x 13.0 cm (AP by transaxial by CC). Previously 6.4 x 11.2 cm (AP by transaxial) *Hematoma extension along the LEFT iliopsoas, new since 08/28/2022 comparison   Musculoskeletal: No interval osseous abnormality. Bilateral groin cutdown scars, more recently on LEFT with a overlying surgical staples.   IMPRESSION: Since  CTA runoff dated 08/28/2022;   VASCULAR  1. Thrombosis of LEFT CFA pseudoaneurysm with new 3.5 cm filling defect, consistent with arterial thrombus, extending from the PSA neck and into the distal CFA. This is at risk for distal embolization and potential acute LEFT lower extremity ischemia. 2. New 8 mm distal LEFT EIA pseudoaneurysm, medial to the stent distal landing zone. 3. Mild interval enlargement of retroperitoneal hematoma, in a craniocaudal dimension measuring 12.2 cm (previously 11.2 cm). No CTA evidence of active extravasation. 4. Patent RIGHT femoral-to-popliteal graft and bilateral iliac stents. 5. Severe burden of splanchnic arterial atherosclerosis, at risk for chronic mesenteric ischemia.   NON-VASCULAR  1. Bibasilar nodular pulmonary opacities, new since 08/2022, favored infectious/inflammatory. Attention on follow-up. 2. Sigmoid diverticulosis and Aortic Atherosclerosis (ICD10-I70.0).  RUQ ultrasound  06/28/2022 showed liver cirrhosis, moderate ascites at RUQ. Gallbladder wall thickening without stones or sonographic Murphy's. CBD 4 mm. Non contrast CT imaging of abdomen pelvis in late March and early April 2023.  Neither of these demonstrated findings of cirrhosis or fatty liver etc March CT showed a 1.5 cm hypodense area in the posterior right hepatic lobe demonstrating non-specific nodularity at periphery. RUQ ultrasound 04/2021 like wise showed no changes in the liver but did demonstrate small perihepatic ascites  CTAP 09/05/2022 showed a normal liver.  HBV surf Ag non-reactive. HBV surface Ab pndg. HCV, HBV core Ab non-reactive, HBV surface Ab reactive w quant 21 cw immunity 12/2021. HBV core Ab total and IgM both non reactive in 2022, 2023.    PAST GI PROCEDURES:  Colonoscopy 06/27/2020 as an inpatient by Dr. Hung: - A single (solitary) ulcer in the rectum. Clip (MR unsafe) was placed.  - Diverticulosis in the sigmoid colon and in the descending colon.  - Large    Int/Ext hemorrhoids.  - No specimens collected.  Colonoscopy 2006: Unable to locate procedure report path report showed a hyperplastic polyp   Past Medical History:  Diagnosis Date   Acute metabolic encephalopathy 05/09/2021   Acute on chronic diastolic CHF (congestive heart failure) 12/15/2020   Acute respiratory disease due to COVID-19 virus 06/16/2020   Alcohol use disorder 06/30/2022   Anemia    Asthma    Bacteremia due to Gram-positive bacteria 05/09/2021   Carotid stenosis 09/12/2019   CHF (congestive heart failure)    Claudication in peripheral vascular disease 09/12/2019   Complication of anesthesia    " i HAVE A HARD TIME WAKING UP " (only one time)   COPD (chronic obstructive pulmonary disease)    COVID-19    positive on  06/16/20   Critical limb ischemia of right lower extremity    ESRD on hemodialysis    t, th. sat dialysis   GERD (gastroesophageal reflux disease)    HLD (hyperlipidemia)    Hypertension    Hypertensive urgency 12/24/2016   Lower GI bleed    Nausea vomiting and diarrhea 08/13/2022   Peripheral vascular disease    PFO with atrial septal aneurysm    by echo 11/2017   Pneumonia    Stroke    Surgical wound infection 09/20/2021   [name] (transient ischemic attack) 11/2017   Tobacco abuse    quit 07/17/19   Volume overload 12/14/2020    Past Surgical History:  Procedure Laterality Date   ABDOMINAL AORTOGRAM W/LOWER EXTREMITY N/A 06/21/2020   Procedure: ABDOMINAL AORTOGRAM W/LOWER EXTREMITY;  Surgeon: Dickson, Christopher S, MD;  Location: [facility];  Service: Cardiovascular;  Laterality: N/A;   ABDOMINAL AORTOGRAM W/LOWER EXTREMITY Right 08/29/2021   Procedure: ABDOMINAL AORTOGRAM W/LOWER EXTREMITY;  Surgeon: Hawken, Thomas N, MD;  Location: [facility];  Service: Cardiovascular;  Laterality: Right;   ABDOMINAL AORTOGRAM W/LOWER EXTREMITY N/A 08/10/2022   Procedure: ABDOMINAL AORTOGRAM W/LOWER EXTREMITY;  Surgeon: Dickson, Christopher S, MD;   Location: [facility];  Service: Cardiovascular;  Laterality: N/A;   AORTOGRAM N/A 08/22/2022   Procedure: AORTOGRAM;  Surgeon: Cain, Brandon Christopher, MD;  Location: [facility];  Service: Vascular;  Laterality: N/A;   APPLICATION OF WOUND VAC Right 09/21/2021   Procedure: APPLICATION OF WOUND VAC;  Surgeon: Dickson, Christopher S, MD;  Location: [facility];  Service: Vascular;  Laterality: Right;   APPLICATION OF WOUND VAC Left 08/28/2022   Procedure: APPLICATION OF PREVENA WOUND VAC WITH PLACEMENT OF MYRIAD MORCELLS;  Surgeon: Robins, Joshua E, MD;  Location: [facility];  Service: Vascular;  Laterality: Left;   ARTERY REPAIR Left 08/28/2022   Procedure: LEFT EXTERNAL ILIAC ARTERY REPAIR;  Surgeon: Robins, Joshua E, MD;  Location: [facility];  Service: Vascular;  Laterality: Left;   AV FISTULA PLACEMENT Left 03/28/2020   Procedure: ARTERIOVENOUS (AV) FISTULA CREATION LEFT;  Surgeon: Clark, Christopher J, MD;  [facility]: [facility];  Service: Vascular;  Laterality: Left;   AV FISTULA PLACEMENT Right 09/23/2020   Procedure: RIGHT Arm ARTERIOVENOUS GRAFT CREATION.;  Surgeon: Dickson, Christopher S, MD;  [facility];  Service: Vascular;  Laterality: Right;   COLONOSCOPY     COLONOSCOPY WITH PROPOFOL N/A 06/27/2020   Procedure: COLONOSCOPY WITH PROPOFOL;  Surgeon: Hung, Patrick, MD;  [facility];  Service: Endoscopy;  Laterality: N/A;   DRAINAGE AND CLOSURE OF LYMPHOCELE Right 09/21/2021   Procedure: EVACUATION OF LYMPHOCELE RIGHT GROIN;  Surgeon: Dickson, Christopher S, MD;  [facility];  Service: Vascular;  Laterality: Right;   ENDARTERECTOMY FEMORAL Right 09/05/2021   Procedure: ENDARTERECTOMY FEMORAL;  Surgeon: Dickson, Christopher S, MD;  [facility];  Service: Vascular;  Laterality: Right;   FEMORAL-POPLITEAL BYPASS GRAFT Right 09/05/2021   Procedure: RIGHT FEMORAL-POPLITEAL ARTERY BYPASS WITH Spaphenous Vein;  Surgeon: Dickson, Christopher S, MD;  [facility];  Service: Vascular;   Laterality: Right;   FINGER SURGERY     HEMOSTASIS CLIP PLACEMENT  06/27/2020   Procedure: HEMOSTASIS CLIP PLACEMENT;  Surgeon: Hung, Patrick, MD;  [facility];  Service: Endoscopy;;   INSERTION OF DIALYSIS CATHETER Right 06/24/2020   Procedure: INSERTION OF TUNNELED  DIALYSIS CATHETER; Removal of temporary dialysis catheter right neck;  Surgeon: Dickson, Christopher S, MD;  [facility];  Service: Vascular;  Laterality: Right;   INSERTION OF DIALYSIS CATHETER N/A 09/07/2022   Procedure: INSERTION OF DIALYSIS CATHETER;  Surgeon: Robins, Joshua E, MD;  [facility];  Service: Vascular;  Laterality: N/A;   INSERTION OF ILIAC STENT Left 08/22/2022   Procedure: INSERTION OF ILIAC STENT;  Surgeon: Cain, Brandon Christopher, MD;  [facility];  Service: Vascular;  Laterality: Left;   IR PARACENTESIS  06/30/2022   LIGATION OF ARTERIOVENOUS  FISTULA Left 07/24/2020   Procedure: LIGATION OF LEFT BRACHIOCEPHALIC ARTERIOVENOUS  FISTULA;  Surgeon: Clark, Christopher J, MD;  [facility];  Service: Vascular;  Laterality: Left;   PERIPHERAL VASCULAR INTERVENTION Right 06/21/2020   Procedure: PERIPHERAL VASCULAR INTERVENTION;  Surgeon: Dickson, Christopher S, MD;  [facility];  Service: Cardiovascular;  Laterality: Right;   PERIPHERAL VASCULAR INTERVENTION Left 08/10/2022   Procedure: PERIPHERAL VASCULAR INTERVENTION;  Surgeon: Dickson, Christopher S, MD;  [facility];  Service: Cardiovascular;  Laterality: Left;  External Illiac   REVISION OF ARTERIOVENOUS GORETEX GRAFT Right 09/02/2022   Procedure: REVISION OF RIGHT ARTERIOVENOUS GORETEX GRAFT;  Surgeon: Robins, Joshua E, MD;  [facility];  Service: Vascular;  Laterality: Right;   TEE WITHOUT CARDIOVERSION N/A 05/13/2021   Procedure: TRANSESOPHAGEAL ECHOCARDIOGRAM (TEE);  Surgeon: Schumann, Christopher L, MD;  [facility];  Service: Cardiovascular;  Laterality: N/A;   TEE WITHOUT CARDIOVERSION N/A  09/07/2022   Procedure: TRANSESOPHAGEAL ECHOCARDIOGRAM (TEE);  Surgeon: Nishan, Peter C, MD;  [facility];  Service: Cardiovascular;  Laterality: N/A;   THROMBECTOMY FEMORAL ARTERY Left 08/22/2022   Procedure: AORTOILIAC THROMBECTOMY, COMMON FEMORAL ENDARTERECTOMY,;  Surgeon: Cain, Brandon Christopher, MD;  [facility];  Service: Vascular;  Laterality: Left;   VEIN HARVEST Right 09/05/2021   Procedure: RIGHT Saphenous VEIN HARVEST;  Surgeon: Dickson, Christopher S, MD;  Location[facility];  Service: Vascular;  Laterality: Right;    Prior to Admission medications   Medication Sig Start Date End Date Taking? Authorizing Provider  albuterol (VENTOLIN HFA) 108 (90 Base) MCG/ACT inhaler INHALE 1-2 PUFFS INTO THE LUNGS EVERY 6 (SIX) HOURS AS NEEDED FOR WHEEZING OR SHORTNESS OF BREATH. 07/02/20 08/22/23  Maness, Philip, MD  aspirin EC 81 MG tablet Take 1 tablet (81 mg total) by mouth daily. Swallow whole. Patient not taking: Reported on 08/22/2022 08/10/22 08/10/23  Dickson, Christopher S, MD  B Complex-C-Zn-Folic Acid (DIALYVITE/ZINC) TABS Take 1 tablet by mouth daily. Patient not taking: Reported on 08/22/2022 04/25/22   [provider]  calcium acetate (PHOSLO) 667 MG capsule Take 2 capsule by mouth three times a day with meals Patient taking differently: Take 1,334 mg by mouth 3 (three) times daily with meals. 06/17/21       clopidogrel (PLAVIX) 75 MG tablet Take 1 tablet (75 mg total) by mouth daily. 08/24/22 09/23/22  Norbert, John, MD  hydrOXYzine (ATARAX) 10 MG tablet Take 10 mg by mouth 3 (three) times daily. 08/18/22   [provider]  metoprolol succinate (TOPROL-XL) 25 MG 24 hr tablet Take 1 tablet (25 mg total) by mouth daily. 07/01/22   Jones, Sarah, DO  rosuvastatin (CRESTOR) 10 MG tablet Take 1 tablet (10 mg total) by mouth daily. Patient not taking: Reported on 08/13/2022 08/10/22 08/10/23  Dickson, Christopher S, MD    Current Facility-Administered Medications  Medication  Dose Route Frequency Provider Last Rate Last Admin   (feeding supplement) PROSource Plus liquid 30 mL  30 mL Oral TID BM Kc, Ramesh, MD   30 mL at 09/11/22 1754   0.9 %  sodium chloride infusion   Intra-arterial PRN Hunsucker, Matthew R, MD   Stopped at 09/08/22 0904   0.9 %  sodium chloride infusion  250 mL Intravenous Continuous Hunsucker, Matthew R, MD 20 mL/hr at 09/11/22 1900 Infusion Verify at 09/11/22 1900   0.9 %  sodium chloride infusion  500 mL Intravenous Once PRN Schuh, McKenzi P, PA-C       acetaminophen (TYLENOL) tablet 650 mg  650 mg Oral Q6H Hawken, Thomas N, MD   650 mg at 09/12/22 0449   alteplase (CATHFLO ACTIVASE) injection 2 mg  2 mg Intracatheter Once PRN Hunsucker, Matthew R, MD       aspirin EC tablet 81 mg  81 mg Oral Q0600 Schuh, McKenzi P, PA-C   81 mg at 09/12/22 0500   calcitRIOL (ROCALTROL) capsule 0.25 mcg  0.25 mcg Oral Q T,Th,Sa-HD Schertz, Robert, MD       calcium acetate (PHOSLO) capsule 1,334 mg  1,334 mg Oral TID WC Schertz, Robert, MD   1,334 mg at 09/11/22 1755   Chlorhexidine Gluconate Cloth 2 % PADS 6 each  6 each Topical Q0600 Schertz, Robert, MD       Darbepoetin Alfa (ARANESP) injection 100 mcg  100 mcg Subcutaneous Q Wed-1800 Hunsucker, Matthew R, MD   100 mcg at 09/02/22 1731   diphenhydrAMINE (BENADRYL) injection 12.5 mg  12.5 mg Intravenous Q6H PRN Hawken, Thomas N, MD       Or   diphenhydrAMINE (BENADRYL) 12.5 MG/5ML elixir 12.5 mg  12.5 mg Oral Q6H PRN Hawken, Thomas N, MD       docusate sodium (COLACE) capsule 100 mg  100 mg Oral BID PRN Hunsucker, Matthew R, MD       feeding supplement (NEPRO CARB STEADY) liquid 237 mL  237 mL Oral TID BM Kc, Ramesh, MD       heparin injection 1,000 Units  1,000 Units Dialysis PRN Hunsucker, Matthew R, MD   1,600 Units at 09/09/22 1852   hydrALAZINE (APRESOLINE) injection 10 mg  10 mg Intravenous Q4H PRN Hunsucker, Matthew R, MD   10 mg at 09/06/22 0749   HYDROmorphone (DILAUDID) 1 mg/mL PCA injection    Intravenous Q4H Hawken, Thomas N, MD   0.9 mg at 09/11/22 1600   magnesium sulfate IVPB 2 g 50 mL  2 g Intravenous Daily PRN Schuh, McKenzi P, PA-C       methocarbamol (ROBAXIN) tablet 500 mg  500 mg Oral TID Hawken, Thomas N, MD   500 mg at 09/11/22 2116   multivitamin (RENA-VIT) tablet 1 tablet  1 tablet Oral QHS Hunsucker, Matthew R, MD   1 tablet at 09/11/22 2116   naloxone (NARCAN)   injection 0.4 mg  0.4 mg Intravenous PRN Hawken, Thomas N, MD       And   sodium chloride flush (NS) 0.9 % injection 9 mL  9 mL Intravenous PRN Hawken, Thomas N, MD       ondansetron (ZOFRAN) injection 4 mg  4 mg Intravenous Q6H PRN Hunsucker, Matthew R, MD   4 mg at 09/07/22 1358   ondansetron (ZOFRAN) injection 4 mg  4 mg Intravenous Q6H PRN Hawken, Thomas N, MD       Oral care mouth rinse  15 mL Mouth Rinse PRN Hunsucker, Matthew R, MD       oxyCODONE (Oxy IR/ROXICODONE) immediate release tablet 5 mg  5 mg Oral Q4H PRN Hunsucker, Matthew R, MD   5 mg at 09/12/22 0448   pantoprazole (PROTONIX) EC tablet 40 mg  40 mg Oral Daily Hunsucker, Matthew R, MD   40 mg at 09/11/22 0938   pentafluoroprop-tetrafluoroeth (GEBAUERS) aerosol 1 Application  1 Application Topical PRN Hunsucker, Matthew R, MD       polyethylene glycol (MIRALAX / GLYCOLAX) packet 17 g  17 g Oral Daily PRN Hunsucker, Matthew R, MD       rosuvastatin (CRESTOR) tablet 10 mg  10 mg Oral Daily Hunsucker, Matthew R, MD   10 mg at 09/11/22 0938   sodium chloride flush (NS) 0.9 % injection 10-40 mL  10-40 mL Intracatheter Q12H Hunsucker, Matthew R, MD   10 mL at 09/11/22 2117   sodium chloride flush (NS) 0.9 % injection 10-40 mL  10-40 mL Intracatheter PRN Hunsucker, Matthew R, MD       tetracaine 1 % injection 10 mL  100 mg Infiltration Once Hunsucker, Matthew R, MD        Allergies as of 08/28/2022 - Review Complete 08/28/2022  Allergen Reaction Noted   Benadryl [diphenhydramine] Rash 08/23/2022   Lidocaine-prilocaine Rash 08/28/2021    Family  History  Problem Relation Age of Onset   Hypertension Mother    Colon cancer Father    Stomach cancer Brother     Social History   Socioeconomic History   Marital status: Divorced    Spouse name: Not on file   Number of children: Not on file   Years of education: Not on file   Highest education level: Not on file  Occupational History   Not on file  Tobacco Use   Smoking status: Former    Packs/day: 1.00    Years: 50.00    Additional pack years: 0.00    Total pack years: 50.00    Types: Cigarettes    Quit date: 07/17/2019    Years since quitting: 3.1   Smokeless tobacco: Never  Vaping Use   Vaping Use: Never used  Substance and Sexual Activity   Alcohol use: Not Currently    Alcohol/week: 1.0 - 3.0 standard drink of alcohol    Types: 1 - 3 Cans of beer per week   Drug use: No   Sexual activity: Not Currently  Other Topics Concern   Not on file  Social History Narrative   Married   Works in carpet installing and flooring   Social Determinants of Health   Financial Resource Strain: Low Risk  (09/12/2019)   Overall Financial Resource Strain (CARDIA)    Difficulty of Paying Living Expenses: Not hard at all  Food Insecurity: Food Insecurity Present (08/22/2022)   Hunger Vital Sign    Worried About Running Out of Food in the Last Year: Sometimes   true    Ran Out of Food in the Last Year: Sometimes true  Transportation Needs: Unmet Transportation Needs (08/22/2022)   PRAPARE - Transportation    Lack of Transportation (Medical): Yes    Lack of Transportation (Non-Medical): No  Physical Activity: Not on file  Stress: No Stress Concern Present (09/12/2019)   [employer] - Occupational Stress Questionnaire    Feeling of Stress : Not at all  Social Connections: Not on file  Intimate Partner Violence: Not At Risk (08/22/2022)   Humiliation, Afraid, Rape, and Kick questionnaire    Fear of Current or Ex-Partner: No    Emotionally Abused: No     Physically Abused: No    Sexually Abused: No    Review of Systems: Gen: Denies fever, sweats or chills. No weight loss.  CV: Denies chest pain, palpitations or edema. Resp: Denies cough, shortness of breath of hemoptysis.  GI: See HPI. GU : Anuric, on hemodialysis. MS: Neck and back pain and muscle weakness. Derm: Denies rash, itchiness, skin lesions or unhealing ulcers. Psych: Denies depression, anxiety, memory loss or confusion. Heme: + Easy bruising.  Neuro:  Denies headaches, dizziness or paresthesias. Endo:  Denies any problems with DM, thyroid or adrenal function.  Physical Exam: Vital signs in last 24 hours: Temp:  [98.2 F (36.8 C)-98.6 F (37 C)] 98.6 F (37 C) (04/05 2319) Pulse Rate:  [66-88] 85 (04/06 0700) Resp:  [7-27] 15 (04/06 0700) BP: (91-160)/(54-78) 101/58 (04/06 0700) SpO2:  [93 %-100 %] 98 % (04/06 0700) Last BM Date : 09/11/22 General: Alert 66-year-old male chronically ill-appearing in no acute distress. Head:  Normocephalic and atraumatic. Eyes:  No scleral icterus. Conjunctiva pink. Ears:  Normal auditory acuity. Nose:  No deformity, discharge or lesions. Mouth: Poor dentition.  No ulcers or lesions.  Neck:  Supple. No lymphadenopathy or thyromegaly.  Lungs: Breath sounds clear throughout. No wheezes, rhonchi or crackles.  Heart: Rate and rhythm, no murmurs. Abdomen: Soft, mildly distended.  Nontender.  Scattered areas of ecchymosis.  Left lower abdominal/groin drain bulb with sanguinous drainage surrounding hematoma. Rectal: Deferred. Musculoskeletal:  Symmetrical without gross deformities.  Pulses:  Normal pulses noted. Extremities:  Without clubbing or edema. Neurologic:  Alert and  oriented x 4. No focal deficits.  Skin:  Intact without significant lesions or rashes. Psych:  Alert and cooperative. Normal mood and affect.  Intake/Output from previous day: 04/05 0701 - 04/06 0700 In: 1387.8 [P.O.:597; I.V.:394.1; Blood:396.7] Out: 170  [Drains:170] Intake/Output this shift: No intake/output data recorded.  Lab Results: Recent Labs    09/11/22 0306 09/11/22 1005 09/11/22 1545 09/11/22 2027 09/12/22 0225 09/12/22 0359  WBC 15.3*  --  16.7*  --  15.7*  --   HGB 6.5*   < > 8.3* 8.0* 7.0* 7.6*  HCT 20.1*   < > 24.9* 24.6* 21.0* 23.2*  PLT 158  --  177  --  173  --    < > = values in this interval not displayed.   BMET Recent Labs    09/11/22 0441 09/11/22 1545 09/12/22 0255  NA 136 134* 134*  K 3.6 3.9 3.9  CL 97* 98 98  CO2 23 24 23  GLUCOSE 89 111* 103*  BUN 37* 52* 64*  CREATININE 4.04* 4.75* 5.48*  CALCIUM 7.8* 7.6* 7.8*   LFT Recent Labs    09/12/22 0255  ALBUMIN 1.7*   PT/INR No results for input(s): "LABPROT", "INR" in the last 72 hours. Hepatitis Panel No   results for input(s): "HEPBSAG", "HCVAB", "HEPAIGM", "HEPBIGM" in the last 72 hours.    Studies/Results: No results found.  IMPRESSION/PLAN:  66-year-old male admitted to the hospital 08/28/2022 in septic shock secondary to MRSA bacteremia from left groin wound with bleeding/retroperitoneal hematoma s/p left common iliac to profunda femoris bypass and left common iliac artery angioplasty and stent placement 09/09/2022 followed by ID on IV antibiotics.  Recurrent painless hematochezia. CTA/08/2022 showed sigmoid diverticulosis. Colonoscopy due to hematochezia during hospital admission 06/2020 identified a solitary ulcer in the rectum which was clipped, diverticulosis to the sigmoid and descending colon with large internal and external hemorrhoids.  Patient passed 2 bright red bloody bowel movements last night.  Hemoglobin dropped from 7.6- > 6.4. Two units of PRBCs ordered, to be transfused during dialysis. -NPO ? CTA to rule out diverticular bleed if brisk bleeding occurs  -Defer endoscopic evaluation recommendations/flex sig to Dr. Dorsey -Continue to monitor the patient closely for active GI bleeding  Acute on chronic anemia.  Hemoglobin  7.6 -> 6.4.  Two units of PRBCs ordered, to transfuse during dialysis. -Check H&H posttransfusion and then every 6 hours x 24 hours -Transfuse for hemoglobin less than 8  ESRD on hemodialysis.  Dialysis session the bedside in process at this time.  CHF  COPD   Albert Harris  09/12/2022, 11:42AM      

## 2022-09-12 NOTE — Transfer of Care (Signed)
Immediate Anesthesia Transfer of Care Note  Patient: Albert Harris  Procedure(s) Performed: FLEXIBLE SIGMOIDOSCOPY  Patient Location: PACU  Anesthesia Type:MAC  Level of Consciousness: drowsy and patient cooperative  Airway & Oxygen Therapy: Patient Spontanous Breathing  Post-op Assessment: Report given to RN and Post -op Vital signs reviewed and stable  Post vital signs: Reviewed and stable  Last Vitals:  Vitals Value Taken Time  BP 116/46 09/12/22 1715  Temp    Pulse 75 09/12/22 1717  Resp    SpO2 91 % 09/12/22 1717  Vitals shown include unvalidated device data.  Last Pain:  Vitals:   09/12/22 1622  TempSrc: Temporal  PainSc:       Patients Stated Pain Goal: 3 (09/09/22 1851)  Complications: No notable events documented.

## 2022-09-12 NOTE — Interval H&P Note (Signed)
History and Physical Interval Note:  09/12/2022 4:32 PM  Albert Harris  has presented today for surgery, with the diagnosis of Hematochezia, anemia.  The various methods of treatment have been discussed with the patient and family. After consideration of risks, benefits and other options for treatment, the patient has consented to  Procedure(s): FLEXIBLE SIGMOIDOSCOPY (N/A) as a surgical intervention.  [name] history has been reviewed, patient examined, no change in status, stable for surgery.  I have reviewed the patient's chart and labs.  Questions were answered to the patient's satisfaction.     Imogene Burn

## 2022-09-13 ENCOUNTER — Encounter (HOSPITAL_COMMUNITY): Payer: Self-pay | Admitting: Vascular Surgery

## 2022-09-13 ENCOUNTER — Inpatient Hospital Stay (HOSPITAL_COMMUNITY): Payer: Medicare Other

## 2022-09-13 DIAGNOSIS — R6521 Severe sepsis with septic shock: Secondary | ICD-10-CM | POA: Diagnosis not present

## 2022-09-13 DIAGNOSIS — A4102 Sepsis due to Methicillin resistant Staphylococcus aureus: Secondary | ICD-10-CM | POA: Diagnosis not present

## 2022-09-13 DIAGNOSIS — K921 Melena: Secondary | ICD-10-CM | POA: Diagnosis not present

## 2022-09-13 DIAGNOSIS — E872 Acidosis, unspecified: Secondary | ICD-10-CM | POA: Diagnosis not present

## 2022-09-13 DIAGNOSIS — D62 Acute posthemorrhagic anemia: Secondary | ICD-10-CM | POA: Diagnosis not present

## 2022-09-13 LAB — BASIC METABOLIC PANEL
Anion gap: 9 (ref 5–15)
Anion gap: 13 (ref 5–15)
BUN: 33 mg/dL — ABNORMAL HIGH (ref 8–23)
BUN: 42 mg/dL — ABNORMAL HIGH (ref 8–23)
CO2: 25 mmol/L (ref 22–32)
CO2: 26 mmol/L (ref 22–32)
Calcium: 7.7 mg/dL — ABNORMAL LOW (ref 8.9–10.3)
Calcium: 8 mg/dL — ABNORMAL LOW (ref 8.9–10.3)
Chloride: 100 mmol/L (ref 98–111)
Chloride: 95 mmol/L — ABNORMAL LOW (ref 98–111)
Creatinine, Ser: 3.71 mg/dL — ABNORMAL HIGH (ref 0.61–1.24)
Creatinine, Ser: 4.51 mg/dL — ABNORMAL HIGH (ref 0.61–1.24)
GFR, Estimated: 17 mL/min — ABNORMAL LOW (ref >60)
GFR, Estimated: 13 mL/min — ABNORMAL LOW (ref >60)
Glucose, Bld: 88 mg/dL (ref 70–99)
Glucose, Bld: 94 mg/dL (ref 70–99)
Potassium: 3.9 mmol/L (ref 3.5–5.1)
Potassium: 3.9 mmol/L (ref 3.5–5.1)
Sodium: 134 mmol/L — ABNORMAL LOW (ref 135–145)
Sodium: 134 mmol/L — ABNORMAL LOW (ref 135–145)

## 2022-09-13 LAB — CBC
HCT: 25.4 % — ABNORMAL LOW (ref 39.0–52.0)
HCT: 22 % — ABNORMAL LOW (ref 39.0–52.0)
Hemoglobin: 8.5 g/dL — ABNORMAL LOW (ref 13.0–17.0)
Hemoglobin: 7.2 g/dL — ABNORMAL LOW (ref 13.0–17.0)
MCH: 31 pg (ref 26.0–34.0)
MCH: 30.9 pg (ref 26.0–34.0)
MCHC: 33.5 g/dL (ref 30.0–36.0)
MCHC: 32.7 g/dL (ref 30.0–36.0)
MCV: 92.7 fL (ref 80.0–100.0)
MCV: 94.4 fL (ref 80.0–100.0)
Platelets: 174 10*3/uL (ref 150–400)
Platelets: 173 10*3/uL (ref 150–400)
RBC: 2.74 MIL/uL — ABNORMAL LOW (ref 4.22–5.81)
RBC: 2.33 MIL/uL — ABNORMAL LOW (ref 4.22–5.81)
RDW: 17.7 % — ABNORMAL HIGH (ref 11.5–15.5)
RDW: 17.8 % — ABNORMAL HIGH (ref 11.5–15.5)
WBC: 19.3 10*3/uL — ABNORMAL HIGH (ref 4.0–10.5)
WBC: 16.9 10*3/uL — ABNORMAL HIGH (ref 4.0–10.5)
nRBC: 0 % (ref 0.0–0.2)
nRBC: 0 % (ref 0.0–0.2)

## 2022-09-13 LAB — RENAL FUNCTION PANEL
Albumin: 1.8 g/dL — ABNORMAL LOW (ref 3.5–5.0)
Anion gap: 9 (ref 5–15)
BUN: 34 mg/dL — ABNORMAL HIGH (ref 8–23)
CO2: 24 mmol/L (ref 22–32)
Calcium: 7.8 mg/dL — ABNORMAL LOW (ref 8.9–10.3)
Chloride: 100 mmol/L (ref 98–111)
Creatinine, Ser: 3.68 mg/dL — ABNORMAL HIGH (ref 0.61–1.24)
GFR, Estimated: 17 mL/min — ABNORMAL LOW (ref >60)
Glucose, Bld: 86 mg/dL (ref 70–99)
Phosphorus: 5.5 mg/dL — ABNORMAL HIGH (ref 2.5–4.6)
Potassium: 3.9 mmol/L (ref 3.5–5.1)
Sodium: 133 mmol/L — ABNORMAL LOW (ref 135–145)

## 2022-09-13 LAB — GLUCOSE, CAPILLARY
Glucose-Capillary: 94 mg/dL (ref 70–99)
Glucose-Capillary: 102 mg/dL — ABNORMAL HIGH (ref 70–99)
Glucose-Capillary: 95 mg/dL (ref 70–99)

## 2022-09-13 LAB — HEMOGLOBIN AND HEMATOCRIT, BLOOD
HCT: 22.7 % — ABNORMAL LOW (ref 39.0–52.0)
HCT: 25 % — ABNORMAL LOW (ref 39.0–52.0)
HCT: 27 % — ABNORMAL LOW (ref 39.0–52.0)
Hemoglobin: 7.5 g/dL — ABNORMAL LOW (ref 13.0–17.0)
Hemoglobin: 8.3 g/dL — ABNORMAL LOW (ref 13.0–17.0)
Hemoglobin: 9.2 g/dL — ABNORMAL LOW (ref 13.0–17.0)

## 2022-09-13 LAB — PREPARE RBC (CROSSMATCH)

## 2022-09-13 MED ORDER — IOHEXOL 350 MG/ML SOLN
100.0000 mL | Freq: Once | INTRAVENOUS | Status: AC | PRN
  Administered 2022-09-13: 100 mL via INTRAVENOUS

## 2022-09-13 MED ORDER — PEG-KCL-NACL-NASULF-NA ASC-C 100 G PO SOLR
0.5000 | Freq: Once | ORAL | Status: AC
  Administered 2022-09-13: 100 g via ORAL
  Filled 2022-09-13: qty 1

## 2022-09-13 MED ORDER — PEG-KCL-NACL-NASULF-NA ASC-C 100 G PO SOLR: 1.0000 | Freq: Once | ORAL | Status: DC

## 2022-09-13 MED ORDER — SODIUM CHLORIDE 0.9% IV SOLUTION: Freq: Once | INTRAVENOUS | Status: AC

## 2022-09-13 MED ORDER — POLYETHYLENE GLYCOL 3350 17 G PO PACK
17.0000 g | PACK | Freq: Two times a day (BID) | ORAL | Status: DC
  Administered 2022-09-14 – 2022-09-23 (×2): 17 g via ORAL
  Filled 2022-09-13 (×13): qty 1

## 2022-09-13 MED ORDER — PEG-KCL-NACL-NASULF-NA ASC-C 100 G PO SOLR
0.5000 | Freq: Once | ORAL | Status: DC
  Filled 2022-09-13: qty 1

## 2022-09-13 NOTE — Progress Notes (Signed)
GI paged at 0631. Would like to inform the team that patient had 2 very large bloody bowel movements, one after another. Expelling approximately 500cc of liquid blood, (suctioned into cannister to account for amount lost) with large clots equivalent to the size of a  tennis ball. Q6 H and H order is no longer active, but it is likely that the patient hemoglobin has dropped and may require additional interventions.

## 2022-09-13 NOTE — Progress Notes (Signed)
Gastroenterology Inpatient Follow Up    Subjective: Per nursing notes, patient passed a large amount of bloody stool and blood clots this morning.  He has not had any additional bowel movements since early this morning at around 6 AM  Objective: Vital signs in last 24 hours: Temp:  [97.7 F (36.5 C)-99.2 F (37.3 C)] 98.3 F (36.8 C) (04/07 1133) Pulse Rate:  [69-91] 79 (04/07 1133) Resp:  [10-24] 11 (04/07 1156) BP: (87-180)/(24-100) 124/61 (04/07 1105) SpO2:  [83 %-100 %] 99 % (04/07 1156) FiO2 (%):  [0 %-21 %] 21 % (04/07 1156) Weight:  [67.9 kg-72.4 kg] 67.9 kg (04/07 0500) Last BM Date : 09/13/22  Intake/Output from previous day: 04/06 0701 - 04/07 0700 In: 803.3 [I.V.:212.4; Blood:590.8] Out: 760 [Drains:60; Stool:500] Intake/Output this shift: Total I/O In: 240 [P.O.:240] Out: -   General appearance: alert and cooperative Resp: no increased WOB Cardio: regular rate GI: non-tender, non-distended  Lab Results: Recent Labs    09/12/22 0225 09/12/22 0359 09/12/22 1528 09/12/22 2138 09/13/22 0303 09/13/22 1104  WBC 15.7*  --  17.4*  --  19.3*  --   HGB 7.0*   < > 9.1* 8.5* 8.5* 7.5*  HCT 21.0*   < > 27.4* 25.8* 25.4* 22.7*  PLT 173  --  170  --  174  --    < > = values in this interval not displayed.   BMET Recent Labs    09/12/22 1528 09/13/22 0303 09/13/22 0304  NA 135 134* 133*  K 3.7 3.9 3.9  CL 98 100 100  CO2 27 25 24   GLUCOSE 89 88 86  BUN 26* 33* 34*  CREATININE 2.85* 3.71* 3.68*  CALCIUM 7.7* 7.7* 7.8*   LFT Recent Labs    09/13/22 0304  ALBUMIN 1.8*   PT/INR No results for input(s): "LABPROT", "INR" in the last 72 hours. Hepatitis Panel No results for input(s): "HEPBSAG", "HCVAB", "HEPAIGM", "HEPBIGM" in the last 72 hours. C-Diff No results for input(s): "CDIFFTOX" in the last 72 hours.  Studies/Results: CT ANGIO GI BLEED  Result Date: 09/13/2022 CLINICAL DATA:  GI bleed. EXAM: CTA ABDOMEN AND PELVIS WITHOUT AND WITH  CONTRAST TECHNIQUE: Multidetector CT imaging of the abdomen and pelvis was performed using the standard protocol during bolus administration of intravenous contrast. Multiplanar reconstructed images and MIPs were obtained and reviewed to evaluate the vascular anatomy. RADIATION DOSE REDUCTION: This exam was performed according to the departmental dose-optimization program which includes automated exposure control, adjustment of the mA and/or kV according to patient size and/or use of iterative reconstruction technique. CONTRAST:  OMNIPAQUE IOHEXOL 350 MG/ML SOLN COMPARISON:  09/09/2022 and 09/05/2022 as well as 08/12/2022 and 08/28/2022. FINDINGS: VASCULAR Aorta: Extensive stable 3-4 mm penetrating ulcer along the left posterolateral wall of the infrarenal segment. Atherosclerotic plaque throughout the abdominal aorta which is normal in caliber. Celiac: Calcified plaque at the origin and throughout. Short segment moderate stenosis near the origin. Otherwise patent. SMA: Significant calcified plaque at the origin calcified and noncalcified plaque proximally causing moderate short segment focal narrowing. SMA is otherwise patent distally. 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 Mild-to-moderate calcified plaque throughout both renal arteries. Right renal artery is patent. IMA: Calcified plaque proximally but otherwise patent. Inflow: Right common and external iliac artery stent present which is patent. Left common iliac artery stent patent. Left external iliac artery is patent. Resolution of previously seen left common femoral  artery thrombosed pseudoaneurysm. Proximal Outflow: Prominent, but patent right common femoral artery with patent bifurcation. Surgical changes adjacent the common femoral artery. There is mild prominence of the left common femoral artery which is otherwise patent. Patent from the femoral artery and collaterals.  Immediate occlusion of the left superficial femoral artery unchanged. Catheter extending anteriorly from the left inguinal region running adjacent the common femoral/external iliac vessels and then along the anterior border of the left iliopsoas muscle. Veins: No obvious venous abnormality within the limitations of this arterial phase study. Review of the MIP images confirms the above findings. NON-VASCULAR Lower chest: Mild stable cardiomegaly. Calcified plaque over the right coronary artery and descending thoracic aorta. Persistent nodular airspace process over the lung bases with largest nodule in the left base measuring 1.1 cm without significant change. Largest nodule over the right base measures 1.1 cm which is slightly smaller. A few other smaller subcentimeter nodules bilaterally unchanged. This nodule airspace process is new since 08/28/2022, but not significantly changed since 09/05/2022. Mild posterior bibasilar dependent atelectasis. No effusion. Hepatobiliary: Liver, gallbladder and biliary tree are normal. Pancreas: Stable homogeneous well-defined oval mass overlying the region of the pancreatic tail measuring approximately 2.4 cm. Spleen: Normal. Adrenals/Urinary Tract: Adrenal glands are normal. Kidneys slightly small with cortical thinning bilaterally. Decreased perfusion of the left kidney compared to the right. No hydronephrosis or focal mass. Bladder is somewhat contracted. Visualized ureters unremarkable. Stomach/Bowel: Stomach and small bowel are normal. Moderate diverticulosis of the colon most notable over the distal descending and sigmoid colon. Appendix is normal. No definite evidence of GI bleed. Lymphatic: No adenopathy. Reproductive: Normal. Other: Stable retroperitoneal/prevesical space hematoma extending superiorly medially deep to the rectus abdominus muscles. This measures proximally 6 x 11.1 cm. Hematoma also extends along the left iliopsoas muscle without significant change.  Musculoskeletal: No focal abnormality. IMPRESSION: VASCULAR: 1. No definite evidence of GI bleed. 2. 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 3. Atherosclerotic plaque throughout the abdominal aorta and branch vessels as described. Short segment moderate stenosis of the proximal celiac artery and SMA. 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 Decreased perfusion of the left kidney compared to the right. 4. Stable retroperitoneal/prevesical space hematoma extending superiorly medially deep to the rectus abdominus muscles with largest measurement 6 x 11.1 cm. Hematoma also extends along the left iliopsoas muscle without significant change. 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 5. 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 NONVASCULAR: 1. No acute findings in the abdomen or pelvis. 2. Persistent nodular airspace process over the lung bases with largest nodule in the left base measuring 1.1 cm without significant change since 09/05/2022. Largest nodule over the right base measures 1.1 cm which is slightly smaller. A few other smaller subcentimeter nodules bilaterally unchanged. Findings likely infectious/inflammatory in nature. Recommend follow-up CT 6 weeks. 3. 2.4 cm homogeneous well-defined oval mass overlying the region of the pancreatic tail without significant change from 05/05/2021 possibly a splenule. Consider follow-up CT 1 year. 4. Colonic diverticulosis without evidence of acute diverticulitis. 5. Aortic atherosclerosis. Atherosclerotic coronary artery disease. 6. Stable cardiomegaly. Aortic Atherosclerosis (ICD10-I70.0). Electronically Signed   By: Elberta Fortis M.D.   On: 09/13/2022 10:13    Medications: I have reviewed the patient's current medications. Scheduled:  (feeding supplement)  PROSource Plus  30 mL Oral TID BM   acetaminophen  650 mg Oral Q6H   acetaminophen  650 mg Oral Once   aspirin EC  81 mg Oral Q0600   calcitRIOL  0.25 mcg Oral Q T,Th,Sa-HD   calcium acetate  1,334 mg Oral TID WC   Chlorhexidine Gluconate Cloth  6 each Topical Q0600   darbepoetin (ARANESP) injection - DIALYSIS  100 mcg Subcutaneous Q Wed-1800   feeding supplement (NEPRO CARB STEADY)  237 mL Oral TID BM   HYDROmorphone   Intravenous Q4H   methocarbamol  500 mg Oral TID   multivitamin  1 tablet Oral QHS   pantoprazole  40 mg Oral Daily   peg 3350 powder  0.5 kit Oral Once   And   [START ON 09/14/2022] peg 3350 powder  0.5 kit Oral Once   [START ON 09/14/2022] polyethylene glycol  17 g Oral BID   rosuvastatin  10 mg Oral Daily   sodium chloride flush  10-40 mL Intracatheter Q12H   Continuous:  sodium chloride Stopped (09/08/22 0904)   sodium chloride 10 mL/hr at 09/12/22 1600   sodium chloride     magnesium sulfate bolus IVPB     ZOX:WRUEA/[id number]:Place/Maintain arterial line **AND** sodium chloride, sodium chloride, alteplase, diphenhydrAMINE **OR** diphenhydrAMINE, docusate sodium, heparin, hydrALAZINE, magnesium sulfate bolus IVPB, naloxone **AND** sodium chloride flush, ondansetron (ZOFRAN) IV, ondansetron (ZOFRAN) IV, mouth rinse, oxyCODONE, pentafluoroprop-tetrafluoroeth, sodium chloride flush  Assessment/Plan: [age] year old male with history of PVD, carotid artery stenosis, HFrEF (EF 50-55%), ESRD on HD, prior C dif 06/2022, alcohol use, solitary rectal ulcer in 2022 presented with septic shock due to MRSA bacteremia from a left groin wound with bleeding/retroperitoneal hematoma s/p left common iliac to profunda femoris bypass and left common iliac artery angioplasty 09/09/22. We were consulted due to rectal bleeding. His last colonoscopy in 06/2020 by Dr. Elnoria HowardHung showed diverticulosis, hemorrhoids, and a solitary rectal ulcer that was treated with a clip.  Flexible sigmoidoscopy yesterday showed 5 rectal  ulcers (largest was 2 cm) that were not actively bleeding and a large amount of brown stool with scant amounts of old blood.  The colon proximal to the rectum was not clearly visualized due to the large amount of stool present.  Patient had a large bloody bowel movement this morning per nursing.  It is possible that the patient had been building up more proximal blood clots that have now passed with use of laxatives.  Patient may have had a diverticular bleed.  CTA today was negative for a source of GI bleed.  Hemoglobin did drop slightly from 8.5 yesterday to 7.5 today.  Will plan for colonoscopy tomorrow for further evaluation after bowel prep to be able to allow for visualization of the proximal colon. -Trend hemoglobin -Plan for colonoscopy tomorrow for further evaluation   LOS: 16 days   Imogene BurnYing C Malaquias Lenker 09/13/2022, 12:06 PM

## 2022-09-13 NOTE — Progress Notes (Signed)
  VASCULAR SURGERY ASSESSMENT & PLAN:   POD 4 ILIOFEMORAL BYPASS, REMOVAL OF INFECTED PATCH, SARTORIUS FLAP: His VAC is in place.  The JP put out 50 cc - 10 cc last 2 shifts.  This will stay in for now.  The left foot is unchanged.  He has a barely audible posterior tibial signal with the Doppler and a dampened monophasic peroneal signal with the Doppler.  ANEMIA: His hemoglobin this morning is 8.5.  He has had some bloody bowel movements.  GI is following the patient.  He had a flexible sigmoidoscopy yesterday which was reportedly unremarkable.   END-STAGE RENAL DISEASE: He has a functioning catheter.   ID: His intraoperative culture grew MRSA.  He is on IV Cubicin.   SUBJECTIVE:   No specific complaints this morning.  No rest pain left foot.  PHYSICAL EXAM:   Vitals:   09/13/22 0600 09/13/22 0700 09/13/22 0742 09/13/22 0915  BP: (!) 143/63 119/67    Pulse: 75 83    Resp: 14 16  20   Temp:   97.9 F (36.6 C)   TempSrc:   Oral   SpO2: 99% (!) 83%  97%  Weight:      Height:       He has a barely audible posterior tibial signal with the Doppler and a dampened monophasic peroneal signal with the Doppler. His back has a good seal on the left groin.  LABS:   Lab Results  Component Value Date   WBC 19.3 (H) 09/13/2022   HGB 8.5 (L) 09/13/2022   HCT 25.4 (L) 09/13/2022   MCV 92.7 09/13/2022   PLT 174 09/13/2022   Lab Results  Component Value Date   CREATININE 3.68 (H) 09/13/2022   Lab Results  Component Value Date   INR 1.2 08/12/2022   CBG (last 3)  Recent Labs    09/11/22 2122 09/12/22 0646 09/13/22 0748  GLUCAP 111* 112* 94    PROBLEM LIST:    Principal Problem:   Lactic acidosis Active Problems:   Metabolic encephalopathy   ESRD (end stage renal disease) on dialysis   Malnutrition of moderate degree   Septic shock   Pseudoaneurysm of femoral artery   Decreased functional mobility   MRSA (methicillin resistant Staphylococcus aureus) septicemia    Pressure injury of skin   CURRENT MEDS:    (feeding supplement) PROSource Plus  30 mL Oral TID BM   acetaminophen  650 mg Oral Q6H   acetaminophen  650 mg Oral Once   aspirin EC  81 mg Oral Q0600   calcitRIOL  0.25 mcg Oral Q T,Th,Sa-HD   calcium acetate  1,334 mg Oral TID WC   Chlorhexidine Gluconate Cloth  6 each Topical Q0600   darbepoetin (ARANESP) injection - DIALYSIS  100 mcg Subcutaneous Q Wed-1800   feeding supplement (NEPRO CARB STEADY)  237 mL Oral TID BM   HYDROmorphone   Intravenous Q4H   methocarbamol  500 mg Oral TID   multivitamin  1 tablet Oral QHS   pantoprazole  40 mg Oral Daily   polyethylene glycol  17 g Oral BID   rosuvastatin  10 mg Oral Daily   sodium chloride flush  10-40 mL Intracatheter Q12H    Waverly Ferrari Office: [phone] 09/13/2022

## 2022-09-13 NOTE — Progress Notes (Addendum)
[facility] Progress Note  Subjective: pt seen in ICU. Hb up to 7.5-8.5.  K+ 3.9. creat 3.68  Vitals:   09/13/22 1156 09/13/22 1200 09/13/22 1300 09/13/22 1400  BP:  (!) 117/55 (!) 127/55 127/67  Pulse: 89 80 77 79  Resp: 11 12 10 12   Temp:      TempSrc:      SpO2: 99% 99% 94% 100%  Weight:      Height:        Exam: Constitutional: lying in bed, alert and pleasant CV: normal rate, no edema Respiratory: bilateral chest rise, no iwob Gastrointestinal: soft, nonended Psych: mood and affect appropriate  Access: Right upper extremity AVG, no bruit or thrill  L fem wound covered w/ dressing   L foot a bit discolored    OP HD: TTS SW  4h   350/800  64.5kg  RFA AVG= clotted/ new TDC in place   Heparin none  - last OP HD 3/14, post wt 65.9kg - venofer 50mg  IV weekly - rocaltrol 0.25 mcg po tiw - phoslo 2 ac tid - no esa, last Hb 11.0  Summary: presented 08/21/22 w/ acute pain/ ischemic L foot. Hx of L EIA stenting on 3/04, and prior RLE bypass. Pt underwent endarterectomy of the left common femoral, profundofemoral, superficial femoral and external iliac vessels with bovine pericardial patch angioplasty and a left iliofemoral and profundofemoral thromboembolectomy on 3/15. Was in ICU until 3/18 for hypotension. DC'd home on 3/18. Pt then returned on 3/22 to ED w/ N/V and unable to feel his L foot. BP's low in ED and was found to have L fem pseudoaneurysm. Had emergent CRRT for high K+. Then taken to OR 3/23 for primary repair of the distal EIA w/ hematoma evacuation. Extubated 3/24. BCx's from  admit were + for MRSA, IV vanc was started. On 3/26 was off CRRT and off pressors. R arm AVG was clotted and went for declot on 3/27 per [facility] but graft re-clotted. Pt had regular HD on 3/28 then temp cath was removed for line holiday. IR placed new TDC on 4/01 and pt had regular HD later the same day. Otherwise as below.    Assessment/ Plan: Left common femoral artery pseudoaneurysm  - sp repair of L EIA on 3/23. Went back to OR 4/3 for L CIA to profunda bypass, L CIA pta and stenting, and muscle flap (sartorious to groin).  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 ID has since signed off.  CVA: +stroke on MRI. Neuro signed off.  ESRD: TTS HD.  SP CRRT 3/23- 3/26. Had HD 3/28, then the temp cath was removed for line holiday. On 4/1 VVS placed TDC and is now back on TTS schedule. Next HD 4/09.  Volume - min edema LE's, 2L O2, soft BP's, doubt sig vol excess Anemia: Hb 8-10 range. Likely multifactorial.  No IV iron given bacteremia. ESA ordered as darbe 100 mcg sq weekly on Wed.  Transfuse prn.  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 Cont po vdra and phoslo 2 ac tid as binder.  Dialysis access: AVG clotted 3/27, went for declot by [facility]VS and then re-clotted. Temp cath removed 3/28. VVS placed new RIJ TDC on 4/1. Using [facility].   Albert Moselle MD [facility] 09/13/2022, 2:16 PM  Recent Labs  Lab 09/12/22 0255 09/12/22 7948 09/13/22 0303 09/13/22 0304 09/13/22 1104  HGB  --    < > 8.5*  --  7.5*  ALBUMIN 1.7*  --   --  1.8*  --   CALCIUM 7.8*   < > 7.7* 7.8*  --   PHOS 7.6*  --   --  5.5*  --   CREATININE 5.48*   < > 3.71* 3.68*  --   K 3.9   < > 3.9 3.9  --    < > = values in this interval not displayed.    No results for input(s): "IRON", "TIBC", "FERRITIN" in the last 168 hours. Inpatient medications:  (feeding supplement) PROSource Plus  30 mL Oral TID BM   acetaminophen  650 mg Oral Q6H   acetaminophen  650 mg Oral Once   aspirin EC  81 mg Oral Q0600   calcitRIOL  0.25 mcg Oral Q T,Th,Sa-HD   calcium acetate  1,334 mg Oral TID WC   Chlorhexidine Gluconate Cloth  6 each Topical Q0600   darbepoetin (ARANESP) injection - DIALYSIS  100 mcg Subcutaneous Q Wed-1800   feeding supplement (NEPRO CARB STEADY)  237 mL Oral TID BM   HYDROmorphone   Intravenous  Q4H   methocarbamol  500 mg Oral TID   multivitamin  1 tablet Oral QHS   pantoprazole  40 mg Oral Daily   peg 3350 powder  0.5 kit Oral Once   And   [START ON 09/14/2022] peg 3350 powder  0.5 kit Oral Once   [START ON 09/14/2022] polyethylene glycol  17 g Oral BID   rosuvastatin  10 mg Oral Daily   sodium chloride flush  10-40 mL Intracatheter Q12H    sodium chloride Stopped (09/08/22 0904)   sodium chloride 10 mL/hr at 09/12/22 1600   sodium chloride     magnesium sulfate bolus IVPB     Place/Maintain arterial line **AND** sodium chloride, sodium chloride, alteplase, diphenhydrAMINE **OR** diphenhydrAMINE, docusate sodium, heparin, hydrALAZINE, magnesium sulfate bolus IVPB, naloxone **AND** sodium chloride flush, ondansetron (ZOFRAN) IV, ondansetron (ZOFRAN) IV, mouth rinse, oxyCODONE, pentafluoroprop-tetrafluoroeth, sodium chloride flush

## 2022-09-13 NOTE — Progress Notes (Signed)
PROGRESS NOTE Albert Harris  ZOX:[id number] DOB: 1956/04/14 DOA: 08/28/2022 PCP: [name], NP  Brief Narrative/Hospital Course: 67 year old male with history of ESRD on HD TTS, PVD,COPD, essential hypertension, chronic systolic congestive heart failure with ejection fraction 35 to 40%, presented with abdominal pain nausea and diaphoresis.Patient was also hypotensive with a lactic acidosis and was admitted to ICU,with a pulseless left lower extremity was found to have femoral pseudoaneurysm.Culture came back with MRSA bacteremia-TEE neg for .   Events: 08/29/22: -Admitted to ICU-on vasopressors for septic shock -s/p OR with Dr. Karin Lieu for left femoral exploration with pseudoaneurysm repair,hematoma evacuation>showed dehiscence at proximal aspect of bovine pericardial patch at the suture line.  -Emergent CRRT for severe hyperkalemia  09/08/22 Transferred to [facility] service  09/09/22 CTA for bleeding on left groin: showed Thrombosis of LEFT CFA pseudoaneurysm with new 3.5 cm filling defect, consistent with arterial thrombus, extending from the PSA neck and into the distal CFA. This is at risk for distal embolization and potential acute LEFT lower extremity ischemia.New 8 mm distal LEFT EIA pseudoaneurysm, medial to the stent distal landing zone. Mild interval enlargement of retroperitoneal hematoma, in a craniocaudal dimension measuring 12.2 cm (previously 11.2 cm).No CTA evidence of active extravasation. Patent RIGHT femoral-to-popliteal graft and bilateral iliac stents.Severe burden of splanchnic arterial atherosclerosis, at risk for chronic mesenteric ischemia. 09/09/22: urgent OR:S/P left common iliac to profunda femoris bypass with ipsilateral nonreversed greater saphenous vein in anatomic tunnel, left common iliac artery angioplasty and stenting, left greater saphenous vein harvest, left sartorius muscle of left groin, left lower extremity angiography with first-order cannulation Transferred to 2H  post op 09/12/22 am: 2 BRBPR> s/p Flex sig>showed 5 rectal ulcers, not actively bleeding- stool brown.advised to avoid constipation, added Miralax BID   Subjective: Patient seen and examined this morning Nursing reports she had 2 large bloody bowel movements approximately 500 cc of liquid blood suctioned into canister Patient denies nausea vomiting abdominal pain fever chills, left foot is still feels "unable to move but has intact sensation GI was paged earlier-I paged and spoke with Dr. Leonides Schanz  Assessment and Plan:  Septic shock with MRSA bacteremia-Initially admitted to ICU on vasopressors>shock resolved Left Groin wound infection w/ MRSA Right groin hematoma with a left common femoral artery pseudoaneurysm Left groin bleeding 4/3 AM and CTA showed arterial thrombus, new distal left EIA pseudoaneurysm (see report for more) S/P Urgent OR 09/09/22 and underwent bypass and stenting->intraoperative culture with MRSA: See OR procedure above on 4/3 w/  left common iliac to profunda femoris bypass/ Lt common iliac artery angioplasty and stenting>OR culture 4/3 MRSA and E Fecium> ID following vancomycin changed to daptomycin.Monitor distal flow, patient at high risk for left leg amputation-left wound VAC and groin in place left foot is unchanged with some mottling and has ischemia with the peroneal signal following vascular surgery following closely.  PVD with history of left external iliac artery and common artery stenting and right femoropopliteal bypass with left common femoral artery pseudoaneurysm on CT 3/22, status post left femoral artery pseudoaneurysm repair 3/22, S/P AVG thrombectomy attempted but patient became hemodynamically unstable and procedure was aborted, followed by vascular  s/p OR with surgery as above on 09/09/22.see above.  Retroperitoneal hemorrhage on CTA 3/22, on 3/30 CT abdomen intrapelvic asked retroperitoneal hematoma within the prevesical space, right greater than left  extension of hemorrhage within the extraperitoneal and lateral pelvic walls. CT 09/09/22: Mild interval enlargement of retroperitoneal hematoma, in a craniocaudal dimension measuring 12.2 cm (previously 11.2  cm).No CTA evidence of active extravasation. MONITOR H/H   Episodes of bright red bleeding per rectum 4/5 night, and 2 large rectal bleeding 500 cc total 4/6 Largest rectal bleeding: Heparin sq- discontinued, GI input appreciated >s/p Flex sig 4/5 >showed 5 rectal ulcers, not actively bleeding- stool brown.advised to avoid constipation, added Miralax BID.  Discussed with GI this morning ordered CT angio-no definite evidence of GI bleed, stable 3 to 4 mm penetrating ulcer of the infrarenal abdominal aorta, stable RP/prevesical space hematoma, resolution of the previously seen left common femoral artery thrombosed pseudoaneurysm> informed GI of the result, possible planning colonoscopy.  Continue to monitor H&H serially  Acute respiratory failure with hypoxemia.   COPD: Cont supplemental oxygen, IS, prn inhalers.  Chronic CHF with preserved EF-based on echo from 4/1 Pulmonary hypertension: TEE 4/1 showed EF 50-55%,severe LVH, no left atrial thrombus detected.  At this fluid with dialysis per nephrology.Net IO Since Admission: 7,624.88 mL [09/13/22 1013]   ESRD on HD TTS: nephrology following cont HD AVG clotted on 3/27- s/p temp catheter removed 3/28 IR placed new TDC on 4/1.  Hyponatremia/hypokalemia/hyperkalemia/ metabolic acidosis: Electrolytes are stable continue to monitor  Recent Labs  Lab 09/07/22 0434 09/08/22 0329 09/09/22 0411 09/09/22 1209 09/10/22 0627 09/10/22 2024 09/11/22 0441 09/11/22 1545 09/12/22 0255 09/12/22 1528 09/13/22 0303 09/13/22 0304  K 4.5   < > 4.2   < > 6.0*   < > 3.6 3.9 3.9 3.7 3.9 3.9  CALCIUM 8.3*   < > 8.3*   < > 7.8*   < > 7.8* 7.6* 7.8* 7.7* 7.7* 7.8*  MG 2.4  --   --   --   --   --   --   --   --   --   --   --   PHOS 7.8*   < > 6.3*  --  9.0*   --  5.0*  --  7.6*  --   --  5.5*   < > = values in this interval not displayed.    Acute blood loss anemia Anemia of chronic kidney disease: Acute blood loss anemia multifactorial  from operative loss(600 cc of blood loss during surgery), retroperitoneal bleed pseudoaneurysm/left groin bleeding/with wound VAC w/ drain in place and rectal bleed.  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  Now on aspirin 81> held 4/6 for rectal bleeding, continue Crestor.   HLD: Continue Crestor  History of hypertension with transient hypotension on 3/30 was managed with IV fluid bolus, not needing antihypertensives now-monitor.   Lung nodule largest 1.1 cm without significant change since 3/30 finding likely infectious/inflammatory, recommended CT in 6 weeks 2.4 cm well-defined pancreatic tail mass unchanged since 05/05/2021 needs CT scan in 1 year  Poor oral intake: Dietitian following Moderate protein calorie malnutrition augment diet as tolerated  Nutrition Problem: Moderate Malnutrition (in the context of chronic illness (ESRD)) Etiology: poor appetite Signs/Symptoms: moderate fat depletion, severe muscle depletion Interventions: Refer to RD note for recommendations, Liberalize Diet, Nepro  shake, MVI  Pressure injury of skin on coccyx stage II POA, wound care   Goals of care: Currently full scope of treatment, prognosis remains to be seen at this time-continues to have ongoing significant medical conditions, left leg at risk of  amputation.  Daughter updated at the bedside again and aware about overall serious medical status Discussed with nursing staff  DVT prophylaxis: SCD's Start: 09/09/22 1841 Code Status:   Code Status: Full Code Family Communication: plan of care discussed with patient at bedside.Patient status is: Patient because of left leg ischemia Level of care: ICU  Dispo: The patient is from: home lives with wife            Anticipated disposition:TBD Objective: Vitals last 24 hrs: Vitals:   09/13/22 0800 09/13/22 0900 09/13/22 0915 09/13/22 1000  BP: (!) 111/57 (!) 158/70  (!) 140/67  Pulse: 82 81 81 80  Resp: 12 15 20 16   Temp:      TempSrc:      SpO2: 91% 99% 97% 97%  Weight:      Height:       Weight change:   Physical Examination: General exam: AAox3, weak,older appearing HEENT:Oral mucosa moist, Ear/Nose WNL grossly, dentition normal. Respiratory system: bilaterally clear BS, no use of accessory muscle Cardiovascular system: S1 & S2 +, regular rate. Gastrointestinal system: Abdomen soft, NT,ND,BS+ Nervous System:Alert, awake, moving extremities and grossly nonfocal Extremities: LE ankle edema neg, lower extremities warm Skin: No rashes,no icterus. MSK: Normal muscle bulk,tone, power  HD catheter on the chest with dressing intact Wound VAC on the left groin Small wound on the right foot lt foot appears cold and some mottling  Medications reviewed:  Scheduled Meds:  (feeding supplement) PROSource Plus  30 mL Oral TID BM   acetaminophen  650 mg Oral Q6H   acetaminophen  650 mg Oral Once   aspirin EC  81 mg Oral Q0600   calcitRIOL  0.25 mcg Oral Q T,Th,Sa-HD   calcium acetate  1,334 mg Oral TID WC   Chlorhexidine Gluconate Cloth  6 each Topical Q0600   darbepoetin (ARANESP) injection - DIALYSIS  100 mcg Subcutaneous Q Wed-1800   feeding supplement (NEPRO CARB STEADY)  237 mL Oral TID BM   HYDROmorphone   Intravenous Q4H   methocarbamol  500 mg Oral TID   multivitamin  1  tablet Oral QHS   pantoprazole  40 mg Oral Daily   polyethylene glycol  17 g Oral BID   rosuvastatin  10 mg Oral Daily   sodium chloride flush  10-40 mL Intracatheter Q12H  Continuous Infusions:  sodium chloride Stopped (09/08/22 0904)   sodium chloride 10 mL/hr at 09/12/22 1600   sodium chloride     magnesium sulfate bolus IVPB     Diet Order             Diet NPO time specified  Diet effective now                   Intake/Output Summary (Last 24 hours) at 09/13/2022 1013 Last data filed at 09/13/2022 0600 Gross per 24 hour  Intake 773.28 ml  Output 760 ml  Net 13.28 ml   Net IO Since Admission: 7,624.88 mL [09/13/22 1013]  Wt Readings from Last 3 Encounters:  09/13/22 67.9 kg  08/23/22 64.1 kg  08/15/22 64.9 kg     Unresulted Labs (From admission, onward)     Start     Ordered   09/14/22 0500  CK  Tomorrow morning,   R       Question:  Specimen collection method  Answer:  Lab=Lab collect   09/13/22 0751   09/13/22 2300  Hemoglobin and hematocrit, blood  Now then every 6 hours,   R     Question:  Specimen collection method  Answer:  Lab=Lab collect   09/13/22 0725   09/13/22 0725  Hemoglobin and hematocrit, blood  Now then every 6 hours,   R     Question:  Specimen collection method  Answer:  Lab=Lab collect   09/13/22 0725   09/11/22 1442  Vitamin C  Once,   R       Question:  Specimen collection method  Answer:  Lab=Lab collect   09/11/22 1441   09/11/22 1442  Vitamin A  Once,   R       Question:  Specimen collection method  Answer:  Lab=Lab collect   09/11/22 1441   09/11/22 1442  Zinc  Once,   R       Question:  Specimen collection method  Answer:  Lab=Lab collect   09/11/22 1441   09/10/22 1500  Basic metabolic panel  2 times daily,   R     Question:  Specimen collection method  Answer:  Lab=Lab collect   09/10/22 0736   09/10/22 0737  CBC  2 times daily,   R     Question:  Specimen collection method  Answer:  Lab=Lab collect   09/10/22 0736   09/10/22  0500  CBC  Daily,   R     Question:  Specimen collection method  Answer:  Lab=Lab collect   09/09/22 1411   08/29/22 0500  Renal function panel (daily at 0500)  Daily,   R      08/28/22 2020          Data Reviewed: I have personally reviewed following labs and imaging studies CBC: Recent Labs  Lab 09/11/22 0306 09/11/22 1005 09/11/22 1545 09/11/22 2027 09/12/22 0225 09/12/22 0359 09/12/22 0956 09/12/22 1528 09/12/22 2138 09/13/22 0303  WBC 15.3*  --  16.7*  --  15.7*  --   --  17.4*  --  19.3*  HGB 6.5*   < > 8.3*   < > 7.0* 7.6* 6.4* 9.1* 8.5* 8.5*  HCT 20.1*   < > 24.9*   < > 21.0* 23.2* 19.2* 27.4* 25.8* 25.4*  MCV 96.6  --  93.3  --  93.3  --   --  91.9  --  92.7  PLT 158  --  177  --  173  --   --  170  --  174   < > = values in this interval not displayed.   Basic Metabolic Panel: Recent Labs  Lab 09/07/22 0434 09/08/22 0329 09/09/22 0411 09/09/22 1209 09/10/22 2878 09/10/22 2024 09/11/22 0441 09/11/22 1545 09/12/22 0255 09/12/22 1528 09/13/22 0303 09/13/22 0304  NA 129*   < > 136   < > 136   < > 136 134* 134* 135 134* 133*  K 4.5   < > 4.2   < > 6.0*   < > 3.6 3.9 3.9 3.7 3.9 3.9  CL 90*   < > 98   < > 98   < > 97* 98 98 98 100 100  CO2 19*   < > 23   < > 21*   < > 23 24 23 27 25 24   GLUCOSE 98   < >  120*   < > 112*   < > 89 111* 103* 89 88 86  BUN 100*   < > 80*   < > 72*   < > 37* 52* 64* 26* 33* 34*  CREATININE 8.70*   < > 6.51*   < > 5.91*   < > 4.04* 4.75* 5.48* 2.85* 3.71* 3.68*  CALCIUM 8.3*   < > 8.3*   < > 7.8*   < > 7.8* 7.6* 7.8* 7.7* 7.7* 7.8*  MG 2.4  --   --   --   --   --   --   --   --   --   --   --   PHOS 7.8*   < > 6.3*  --  9.0*  --  5.0*  --  7.6*  --   --  5.5*   < > = values in this interval not displayed.   GFR: Estimated Creatinine Clearance: 18.5 mL/min (A) (by C-G formula based on SCr of 3.68 mg/dL (H)). Liver Function Tests: Recent Labs  Lab 09/09/22 0411 09/10/22 0627 09/11/22 0441 09/12/22 0255 09/13/22 0304   ALBUMIN 1.9* 2.2* 1.9* 1.7* 1.8*  CBG: Recent Labs  Lab 09/09/22 0808 09/11/22 2122 09/12/22 0646 09/13/22 0748  GLUCAP 114* 111* 112* 94   No results for input(s): "PROCALCITON", "LATICACIDVEN" in the last 168 hours.   Recent Results (from the past 240 hour(s))  Surgical pcr screen     Status: None   Collection Time: 09/09/22 11:16 AM   Specimen: Nasal Mucosa; Nasal Swab  Result Value Ref Range Status   MRSA, PCR NEGATIVE NEGATIVE Final   Staphylococcus aureus NEGATIVE NEGATIVE Final    Comment: (NOTE) The Xpert SA Assay (FDA approved for NASAL specimens in patients 20 years of age and older), is one component of a comprehensive surveillance program. It is not intended to diagnose infection nor to guide or monitor treatment. Performed at [facility], [address], [place], [place] [address]   Aerobic/Anaerobic Culture w Gram Stain (surgical/deep wound)     Status: None (Preliminary result)   Collection Time: 09/09/22  1:28 PM   Specimen: Groin, Left; Wound  Result Value Ref Range Status   Specimen Description GROIN LEFT  Final   Special Requests LEFT GROIN HEMATOMA PT ON VANC ANCEF  Final   Gram Stain   Final    ABUNDANT WBC PRESENT, PREDOMINANTLY PMN RARE GRAM POSITIVE COCCI IN PAIRS Performed at [facility], [address] [place], [place] [address]    Culture   Final    RARE METHICILLIN RESISTANT STAPHYLOCOCCUS AUREUS FEW ENTEROCOCCUS FAECIUM VANCOMYCIN RESISTANT ENTEROCOCCUS ISOLATED NO ANAEROBES ISOLATED; CULTURE IN PROGRESS FOR 5 DAYS    Report Status PENDING  Incomplete   Organism ID, Bacteria METHICILLIN RESISTANT STAPHYLOCOCCUS AUREUS  Final   Organism ID, Bacteria ENTEROCOCCUS FAECIUM  Final      Susceptibility   Enterococcus faecium - MIC*    AMPICILLIN >=32 RESISTANT Resistant     VANCOMYCIN >=32 RESISTANT Resistant     GENTAMICIN SYNERGY SENSITIVE Sensitive     * FEW ENTEROCOCCUS FAECIUM   Methicillin resistant staphylococcus  aureus - MIC*    CIPROFLOXACIN 1 SENSITIVE Sensitive     ERYTHROMYCIN >=8 RESISTANT Resistant     GENTAMICIN <=0.5 SENSITIVE Sensitive     OXACILLIN >=4 RESISTANT Resistant     TETRACYCLINE <=1 SENSITIVE Sensitive     VANCOMYCIN 1 SENSITIVE Sensitive     TRIMETH/SULFA <=  10 SENSITIVE Sensitive     CLINDAMYCIN <=0.25 SENSITIVE Sensitive     RIFAMPIN <=0.5 SENSITIVE Sensitive     Inducible Clindamycin NEGATIVE Sensitive     * RARE METHICILLIN RESISTANT STAPHYLOCOCCUS AUREUS  Aerobic/Anaerobic Culture w Gram Stain (surgical/deep wound)     Status: None (Preliminary result)   Collection Time: 09/09/22  1:28 PM   Specimen: Groin, Left; Wound  Result Value Ref Range Status   Specimen Description TISSUE LEFT GROIN  Final   Special Requests PT ON VANC ANCEF LEFT HEMATOMA  Final   Gram Stain   Final    ABUNDANT WBC PRESENT, PREDOMINANTLY PMN RARE GRAM POSITIVE COCCI IN PAIRS Performed at [facility], [address] [place], [place] [address]    Culture   Final    RARE STAPHYLOCOCCUS AUREUS RARE ENTEROCOCCUS FAECIUM VANCOMYCIN RESISTANT ENTEROCOCCUS ISOLATED NO ANAEROBES ISOLATED; CULTURE IN PROGRESS FOR 5 DAYS    Report Status PENDING  Incomplete   Organism ID, Bacteria STAPHYLOCOCCUS AUREUS  Final   Organism ID, Bacteria ENTEROCOCCUS FAECIUM  Final      Susceptibility   Enterococcus faecium - MIC*    AMPICILLIN >=32 RESISTANT Resistant     VANCOMYCIN >=32 RESISTANT Resistant     GENTAMICIN SYNERGY SENSITIVE Sensitive     * RARE ENTEROCOCCUS FAECIUM   Staphylococcus aureus - MIC*    CIPROFLOXACIN 1 SENSITIVE Sensitive     ERYTHROMYCIN >=8 RESISTANT Resistant     GENTAMICIN <=0.5 SENSITIVE Sensitive     OXACILLIN >=4 RESISTANT Resistant     TETRACYCLINE <=1 SENSITIVE Sensitive     VANCOMYCIN <=0.5 SENSITIVE Sensitive     TRIMETH/SULFA <=10 SENSITIVE Sensitive     CLINDAMYCIN <=0.25 SENSITIVE Sensitive     RIFAMPIN <=0.5 SENSITIVE Sensitive     Inducible Clindamycin  NEGATIVE Sensitive     * RARE STAPHYLOCOCCUS AUREUS  Aerobic/Anaerobic Culture w Gram Stain (surgical/deep wound)     Status: None (Preliminary result)   Collection Time: 09/09/22  2:48 PM   Specimen: PATH Other; Tissue  Result Value Ref Range Status   Specimen Description WOUND  Final   Special Requests LEFT COMMON ILIAC STENTS PT ON VANC ANCEF  Final   Gram Stain   Final    ABUNDANT WBC PRESENT,BOTH PMN AND MONONUCLEAR FEW GRAM POSITIVE COCCI IN PAIRS Performed at [facility], [address] [place], [place] [address]    Culture   Final    ABUNDANT METHICILLIN RESISTANT STAPHYLOCOCCUS AUREUS NO ANAEROBES ISOLATED; CULTURE IN PROGRESS FOR 5 DAYS    Report Status PENDING  Incomplete   Organism ID, Bacteria METHICILLIN RESISTANT STAPHYLOCOCCUS AUREUS  Final      Susceptibility   Methicillin resistant staphylococcus aureus - MIC*    CIPROFLOXACIN 1 SENSITIVE Sensitive     ERYTHROMYCIN >=8 RESISTANT Resistant     GENTAMICIN <=0.5 SENSITIVE Sensitive     OXACILLIN >=4 RESISTANT Resistant     TETRACYCLINE <=1 SENSITIVE Sensitive     VANCOMYCIN 1 SENSITIVE Sensitive     TRIMETH/SULFA <=10 SENSITIVE Sensitive     CLINDAMYCIN <=0.25 SENSITIVE Sensitive     RIFAMPIN <=0.5 SENSITIVE Sensitive     Inducible Clindamycin NEGATIVE Sensitive     * ABUNDANT METHICILLIN RESISTANT STAPHYLOCOCCUS AUREUS  Aerobic/Anaerobic Culture w Gram Stain (surgical/deep wound)     Status: None (Preliminary result)   Collection Time: 09/09/22  2:53 PM   Specimen: PATH Vessel; Tissue  Result Value Ref Range Status   Specimen Description TISSUE  Final   Special Requests VESSEL PT ON VANC ANCEF  Final   Gram Stain   Final    NO WBC SEEN RARE GRAM POSITIVE COCCI Performed at [facility], [address] [place], [place] [address]    Culture   Final    RARE STAPHYLOCOCCUS AUREUS SUSCEPTIBILITIES PERFORMED ON PREVIOUS CULTURE WITHIN THE LAST 5 DAYS. NO ANAEROBES ISOLATED; CULTURE IN PROGRESS  FOR 5 DAYS    Report Status PENDING  Incomplete    Antimicrobials: Anti-infectives (From admission, onward)    Start     Dose/Rate Route Frequency Ordered Stop   09/12/22 1800  DAPTOmycin (CUBICIN) 600 mg in sodium chloride 0.9 % IVPB        600 mg 124 mL/hr over 30 Minutes Intravenous  Once 09/12/22 1049 09/12/22 2033   09/11/22 1615  DAPTOmycin (CUBICIN) 600 mg in sodium chloride 0.9 % IVPB        600 mg 124 mL/hr over 30 Minutes Intravenous  Once 09/11/22 1522 09/11/22 2042   09/10/22 1200  vancomycin (VANCOREADY) IVPB 500 mg/100 mL  Status:  Discontinued        500 mg 100 mL/hr over 60 Minutes Intravenous Every T-Th-Sa (Hemodialysis) 09/09/22 0719 09/10/22 0719   09/10/22 0815  vancomycin (VANCOREADY) IVPB 750 mg/150 mL        750 mg 150 mL/hr over 60 Minutes Intravenous  Once 09/10/22 0719 09/10/22 0939   09/10/22 0718  vancomycin variable dose per unstable renal function (pharmacist dosing)  Status:  Discontinued         Does not apply See admin instructions 09/10/22 0719 09/11/22 1522   09/09/22 2000  piperacillin-tazobactam (ZOSYN) IVPB 2.25 g  Status:  Discontinued        2.25 g 100 mL/hr over 30 Minutes Intravenous Every 8 hours 09/09/22 1849 09/10/22 1003   09/09/22 1145  ceFAZolin (ANCEF) 2-4 GM/100ML-% IVPB       Note to Pharmacy: Payton Emerald A: cabinet override      09/09/22 1145 09/09/22 2359   09/08/22 0830  vancomycin (VANCOREADY) IVPB 750 mg/150 mL        750 mg 150 mL/hr over 60 Minutes Intravenous  Once 09/08/22 0743 09/08/22 1004   09/07/22 1200  ceFAZolin (ANCEF) IVPB 2g/100 mL premix       Note to Pharmacy: Give in IR for surgical prophylaxis   2 g 200 mL/hr over 30 Minutes Intravenous  Once 09/07/22 1100 09/07/22 1327   09/07/22 1101  ceFAZolin (ANCEF) 2-4 GM/100ML-% IVPB       Note to Pharmacy: Phebe Colla N: cabinet override      09/07/22 1101 09/07/22 1335   09/07/22 0734  vancomycin variable dose per unstable renal function (pharmacist dosing)   Status:  Discontinued         Does not apply See admin instructions 09/07/22 0734 09/09/22 0718   09/05/22 1600  vancomycin (VANCOREADY) IVPB 750 mg/150 mL        750 mg 150 mL/hr over 60 Minutes Intravenous  Once 09/05/22 1505 09/05/22 1717   09/02/22 1227  ceFAZolin (ANCEF) 2-4 GM/100ML-% IVPB       Note to Pharmacy: Isabel Caprice, Destiny: cabinet override      09/02/22 1227 09/03/22 0044   09/02/22 0520  vancomycin variable dose per unstable renal function (pharmacist dosing)  Status:  Discontinued         Does not apply See admin instructions 09/02/22 0521 09/05/22 0940   08/31/22 1800  vancomycin (  VANCOREADY) IVPB 750 mg/150 mL  Status:  Discontinued        750 mg 150 mL/hr over 60 Minutes Intravenous Every 24 hours 08/31/22 0822 09/02/22 0521   08/30/22 1800  vancomycin (VANCOREADY) IVPB 1500 mg/300 mL        1,500 mg 150 mL/hr over 120 Minutes Intravenous STAT 08/30/22 1739 08/30/22 2139      Culture/Microbiology    Component Value Date/Time   SDES TISSUE 09/09/2022 1453   SPECREQUEST VESSEL PT ON VANC ANCEF 09/09/2022 1453   CULT  09/09/2022 1453    RARE STAPHYLOCOCCUS AUREUS SUSCEPTIBILITIES PERFORMED ON PREVIOUS CULTURE WITHIN THE LAST 5 DAYS. NO ANAEROBES ISOLATED; CULTURE IN PROGRESS FOR 5 DAYS    REPTSTATUS PENDING 09/09/2022 1453  Other culture-see note  Radiology Studies: No results found.   LOS: 16 days   Lanae Boast, MD [facility]  09/13/2022, 10:13 AM

## 2022-09-14 ENCOUNTER — Encounter (HOSPITAL_COMMUNITY)
Admission: EM | Disposition: A | Discharge status: Hospice | Payer: Self-pay | Source: Home / Self Care | Attending: Internal Medicine

## 2022-09-14 ENCOUNTER — Encounter (HOSPITAL_COMMUNITY): Payer: Self-pay | Admitting: Internal Medicine

## 2022-09-14 DIAGNOSIS — S31109A Unspecified open wound of abdominal wall, unspecified quadrant without penetration into peritoneal cavity, initial encounter: Secondary | ICD-10-CM | POA: Diagnosis not present

## 2022-09-14 DIAGNOSIS — E872 Acidosis, unspecified: Secondary | ICD-10-CM | POA: Diagnosis not present

## 2022-09-14 DIAGNOSIS — R6521 Severe sepsis with septic shock: Secondary | ICD-10-CM | POA: Diagnosis not present

## 2022-09-14 DIAGNOSIS — A4102 Sepsis due to Methicillin resistant Staphylococcus aureus: Secondary | ICD-10-CM | POA: Diagnosis not present

## 2022-09-14 DIAGNOSIS — A419 Sepsis, unspecified organism: Secondary | ICD-10-CM | POA: Diagnosis not present

## 2022-09-14 DIAGNOSIS — K922 Gastrointestinal hemorrhage, unspecified: Secondary | ICD-10-CM | POA: Diagnosis not present

## 2022-09-14 LAB — AEROBIC/ANAEROBIC CULTURE W GRAM STAIN (SURGICAL/DEEP WOUND): Gram Stain: NONE SEEN

## 2022-09-14 LAB — HEMOGLOBIN AND HEMATOCRIT, BLOOD
HCT: 21.6 % — ABNORMAL LOW (ref 39.0–52.0)
HCT: 22.5 % — ABNORMAL LOW (ref 39.0–52.0)
Hemoglobin: 7.1 g/dL — ABNORMAL LOW (ref 13.0–17.0)
Hemoglobin: 7.3 g/dL — ABNORMAL LOW (ref 13.0–17.0)

## 2022-09-14 LAB — CBC
HCT: 25.4 % — ABNORMAL LOW (ref 39.0–52.0)
Hemoglobin: 8.1 g/dL — ABNORMAL LOW (ref 13.0–17.0)
MCH: 30.6 pg (ref 26.0–34.0)
MCHC: 31.9 g/dL (ref 30.0–36.0)
MCV: 95.8 fL (ref 80.0–100.0)
Platelets: 213 10*3/uL (ref 150–400)
RBC: 2.65 MIL/uL — ABNORMAL LOW (ref 4.22–5.81)
RDW: 18.3 % — ABNORMAL HIGH (ref 11.5–15.5)
WBC: 20.1 10*3/uL — ABNORMAL HIGH (ref 4.0–10.5)
nRBC: 0 % (ref 0.0–0.2)

## 2022-09-14 LAB — BASIC METABOLIC PANEL
Anion gap: 17 — ABNORMAL HIGH (ref 5–15)
BUN: 47 mg/dL — ABNORMAL HIGH (ref 8–23)
CO2: 21 mmol/L — ABNORMAL LOW (ref 22–32)
Calcium: 8.3 mg/dL — ABNORMAL LOW (ref 8.9–10.3)
Chloride: 96 mmol/L — ABNORMAL LOW (ref 98–111)
Creatinine, Ser: 5.23 mg/dL — ABNORMAL HIGH (ref 0.61–1.24)
GFR, Estimated: 11 mL/min — ABNORMAL LOW (ref >60)
Glucose, Bld: 93 mg/dL (ref 70–99)
Potassium: 4.4 mmol/L (ref 3.5–5.1)
Sodium: 134 mmol/L — ABNORMAL LOW (ref 135–145)

## 2022-09-14 LAB — CK: Total CK: 57 U/L (ref 49–397)

## 2022-09-14 LAB — MAGNESIUM: Magnesium: 2.1 mg/dL (ref 1.7–2.4)

## 2022-09-14 LAB — GLUCOSE, CAPILLARY
Glucose-Capillary: 61 mg/dL — ABNORMAL LOW (ref 70–99)
Glucose-Capillary: 64 mg/dL — ABNORMAL LOW (ref 70–99)
Glucose-Capillary: 114 mg/dL — ABNORMAL HIGH (ref 70–99)
Glucose-Capillary: 120 mg/dL — ABNORMAL HIGH (ref 70–99)
Glucose-Capillary: 114 mg/dL — ABNORMAL HIGH (ref 70–99)

## 2022-09-14 LAB — PHOSPHORUS: Phosphorus: 7.6 mg/dL — ABNORMAL HIGH (ref 2.5–4.6)

## 2022-09-14 LAB — PREPARE RBC (CROSSMATCH)

## 2022-09-14 SURGERY — CANCELLED PROCEDURE

## 2022-09-14 MED ORDER — SODIUM CHLORIDE 0.9 % IV SOLN
10.0000 mg/kg | INTRAVENOUS | Status: DC
  Administered 2022-09-19: 750 mg via INTRAVENOUS
  Filled 2022-09-14: qty 15

## 2022-09-14 MED ORDER — DICYCLOMINE HCL 10 MG PO CAPS
10.0000 mg | ORAL_CAPSULE | Freq: Four times a day (QID) | ORAL | Status: DC | PRN
  Administered 2022-09-14: 10 mg via ORAL
  Filled 2022-09-14 (×2): qty 1

## 2022-09-14 MED ORDER — PEG-KCL-NACL-NASULF-NA ASC-C 100 G PO SOLR
0.5000 | Freq: Once | ORAL | Status: AC
  Administered 2022-09-15: 100 g via ORAL

## 2022-09-14 MED ORDER — SODIUM CHLORIDE 0.9 % IV SOLN
8.0000 mg/kg | INTRAVENOUS | Status: DC
  Administered 2022-09-16 – 2022-09-22 (×3): 600 mg via INTRAVENOUS
  Filled 2022-09-14 (×4): qty 12

## 2022-09-14 MED ORDER — PEG-KCL-NACL-NASULF-NA ASC-C 100 G PO SOLR
0.5000 | Freq: Once | ORAL | Status: AC
  Administered 2022-09-14: 100 g via ORAL
  Filled 2022-09-14: qty 1

## 2022-09-14 MED ORDER — SODIUM CHLORIDE 0.9 % IV SOLN: INTRAVENOUS | Status: DC

## 2022-09-14 MED ORDER — ZINC OXIDE 40 % EX OINT
TOPICAL_OINTMENT | Freq: Three times a day (TID) | CUTANEOUS | Status: DC
  Administered 2022-09-14 – 2022-10-20 (×13): 1 via TOPICAL
  Filled 2022-09-14 (×6): qty 57

## 2022-09-14 MED ORDER — BOOST / RESOURCE BREEZE PO LIQD CUSTOM
1.0000 | Freq: Three times a day (TID) | ORAL | Status: DC
  Administered 2022-09-14 – 2022-09-15 (×3): 1 via ORAL

## 2022-09-14 MED ORDER — MEDIHONEY WOUND/BURN DRESSING EX PSTE
1.0000 | PASTE | Freq: Every day | CUTANEOUS | Status: DC
  Administered 2022-09-17 – 2022-09-24 (×8): 1 via TOPICAL
  Filled 2022-09-14 (×3): qty 44

## 2022-09-14 MED ORDER — PEG-KCL-NACL-NASULF-NA ASC-C 100 G PO SOLR: 1.0000 | Freq: Once | ORAL | Status: DC

## 2022-09-14 MED ORDER — DAKINS (1/4 STRENGTH) 0.125 % EX SOLN
Freq: Two times a day (BID) | CUTANEOUS | Status: AC
  Administered 2022-09-14 – 2022-09-15 (×2): 1
  Filled 2022-09-14 (×2): qty 473

## 2022-09-14 MED ORDER — SODIUM CHLORIDE 0.9% IV SOLUTION: Freq: Once | INTRAVENOUS | Status: AC

## 2022-09-14 SURGICAL SUPPLY — 22 items

## 2022-09-14 NOTE — Progress Notes (Signed)
NAME:  Albert Harris, MRN:  [id number], DOB:  Apr 30, 1956, LOS: 17 ADMISSION DATE:  08/28/2022, CONSULTATION DATE:  08/28/22 REFERRING MD: Constance Goltz , CHIEF COMPLAINT:  Abdominal pain  History of Present Illness:  67 yo male brought to ER with abdominal pain, nausea, diaphoresis and unable to feel his Lt foot.  Hypotensive with lactic acidosis in ER.  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  Hx of ESRD and had hyperkalemia.  Had emergent CRRT.  Vascular consulted and taken to OR.    Pertinent  Medical History  ESRD -AV fistula (no flow tonight), PAD, COPD, HLD, HTN, HFrEF 35-40%, PAD  Significant Hospital Events: Including procedures, antibiotic start and stop dates in addition to other pertinent events   3/22 Admit, emergent CRRT, to OR for repair of distal Lt external iliac artery 3/24 Extubated. CT head obtained for altered mental status did not show any acute abnormalities 3/26 Off CRRT and pressors, Vascular surgery consulted for clotted AVG 3/27 AV graft thrombectomy attempted in OR but patient became hemodynamically unstable and procedure was aborted.  Was on pressors briefly 3/28 dialysis followed by CVC and aline removal for line holiday 3/29 MRI brain ordered for LLE weakness and found with right subinsular 59mm infarct. Neuro consulted 3/30 CT A/P increased hematoma in left pelvic region associated with Hg drop 9>7 4/1 hgb stable, TEE, new tunneled line planned 4/3 L groin bleeding. Taken to OR urgently by [facility] for iliofemoral bypass, removal of infected patch, sartorius flap.  4/6, 4/7 ongoing rectal bleeding. Transfused both days.  4/6 flex sig with multiple non-bleeding ulcers.  4/8 ongoing rectal bleeding. Transfusing again. Did not drink bowel prep for colonoscopy. Aborted.  PCCM called back.   Interim History / Subjective:   Called back for ongoing rectal bleeding despite multiple transfusions.  Borderline hypotensive.   Patient somnolent but  arouses easily and reports no complaints. Denies CP, SOB, N/V, lightheaded, and dizziness.   Objective   Blood pressure (!) 89/63, pulse 83, temperature 97.8 F (36.6 C), temperature source Oral, resp. rate 19, height 5\' 8"  (1.727 m), weight 75.2 kg, SpO2 96 %.    FiO2 (%):  [21 %] 21 %   Intake/Output Summary (Last 24 hours) at 09/14/2022 1229 Last data filed at 09/14/2022 0820 Gross per 24 hour  Intake 795 ml  Output 50 ml  Net 745 ml    Filed Weights   09/12/22 1622 09/13/22 0500 09/14/22 0800  Weight: 72.4 kg 67.9 kg 75.2 kg   Physical Exam:  General: Frail gentleman who appears older than stated age. Pale HENT: Stem/AT, PERRL, no JVD Respiratory: Clear bilateral breath sounds. Cardiovascular: RRR, no MRG GI: Soft, NT, ND Extremities: No edema. Dopplerable pulses in bilateral lower extremities. Palpable uppers.  Neuro: Somnolent, easily arousable. Cooperates with exam.    Resolved Hospital Problem list     Assessment & Plan:   Acute blood loss anemia secondary to hematochezia Flex sig 4/6 with 5 non-bleeding ulcers identified S/p 3 units total PRBC  - GI following, suspects diverticular source - Plans for colonoscopy tomorrow - Patient instructed on importance of bowel prep - Transfusing 1 unit PRBC now for Hgb goal greater than 8 in the setting of active hemorrhage.  - Trend H&H  Lt common femoral artery pseudoaneurysm s/p Lt groin hematoma evacuation, primary repair of distal external iliac artery. Further complicated 4/3 by L groin bleeding and R/p bleed with new pseudoaneurysm and aterial thrombus. Now s/p  iliofemoral  bypass, removal of infected patch, sartorius flap.  - Postop care per vascular surgery. OK to DC heparin per team in setting of bleed - Fearful for eventual limb loss with poor flow.   MRSA bacteremia 2/2 post-op wound infection TEE negative for veg Surgical cultures grew MRSA and E. Faecium with multiple resistances.  [facility] 3/27 negative - Continue  daptomycin - Appreciate ID  ESRD. - HD per nephrology via RIJ tunneled catheter.  - Next scheduled for 4/9 (TTS)  Chronic systolic CHF with EF 35 to 40% on Echo from 06/29/22. Hx of pulmonary hypertension. - Hold outpt toprol, plavix for now - ok to hold ASA?  Acute metabolic encephalopathy from uremia, acidosis. Acute 9mm right subinsular stroke  CT head on 3/24 was normal. - Further imaging and work-up per Neuro. Appreciate consult team's assistance  Acute respiratory failure with hypoxia Hx of COPD. - supplemental O2 for sat goal 92% - CXR looks like some congestion. Consider gentle diuresis when BP will allow.  - no home maintenance COPD medications - PRN albuterol  Best Practice (right click and "Reselect all SmartList Selections" daily)   Diet/type: Regular consistency (see orders) DVT prophylaxis: systemic heparin  GI prophylaxis: PPI. Lines: Dialysis Catheter tunneled  Foley:  N/A Code Status:  full code Last date of multidisciplinary goals of care discussion [x]    Critical care time: 37 minutes     Joneen Roach, AGACNP-BC [facility]  See Amion for personal pager PCCM on call pager [phone] until 7pm. Please call Elink 7p-7a. [phone]  09/14/2022 1:42 PM

## 2022-09-14 NOTE — Progress Notes (Signed)
Pt refused to take bowel prep for colonoscopy. Dr. Tomasa Rand made aware and pt's procedure will be postponed and nurse has been made aware. Weston Settle, RN

## 2022-09-14 NOTE — Progress Notes (Addendum)
I have seen and examined the patient. I have personally reviewed the clinical findings, laboratory findings, microbiological data and imaging studies. The assessment and treatment plan was discussed with the Nurse Practitioner Jeanine Luz. I agree with her/his recommendations except following additions/corrections.  Afebrile Hb at 7.1  Exam -  Awake, alert and oriented, follows commands  Respiratory effort normal on [place] Normal Heart sounds  LLQ abdomen and left groin tenderness.  Left groin wound with a vac +.  Left foot cool and very faint PT. RT foot warm   MRSA bacteremia  Possible C5-C6,  ? Left mandible discitis/osteomyelitis  Complicated post op wound infection of left groin with pseudoaneurysm following bovine patch angioplasty s/p multiple vascular interventions ( MRSE and VRE) ABRBPR - GI following - plan for c scope and dialysis tomorrow  Continue Daptomycin, monitor CPK. Will need prolonged course of at least 6 weeks of IV antibiotics  to be followed by PO suppression indefinitely  VVS following, high risk for limb loss noted, considered to be poor candidate for revascularization  ID available as needed, please call with active questions   Odette Fraction, MD Infectious Disease Physician [facility] [address] [place], [place] [address] Phone: [phone]  Fax: [phone]   [facility]  Date of Admission:  08/28/2022     Total days of antibiotics 17         ASSESSMENT:  Mr. Griep is POD #5 from surgical intervention for MRSA infected pseudoaneurysm of left femoral patch angioplasty with course complicated by hematochezia and history of rectal ulcers s/p flex sigmoidoscopy with findings of multiple rectal ulcers with GI following and recommending colonoscopy. Tolerating Daptomycin with last CK level 57 and denies cough. Continue therapeutic drug monitoring of CK levels with  continued dose of Daptomycin. Anticipate prolonged course of 6 weeks with start date to be determined pending any additional surgical intervention followed by suppression secondary to retention of stents. Plan of care discussed with Mr. Congrove and daughter at beside. Remaining medical and supportive care per Internal Medicine.   PLAN:  Continue current dose of Daptomycin Post-surgical and additional surgical intervention per Vascular Surgery.  Hematochezia per GI.  Therapeutic drug monitor of CK levels Remaining medical and supportive care per Internal Medicine.   Principal Problem:   Lactic acidosis Active Problems:   Lower GI bleed   Metabolic encephalopathy   ESRD (end stage renal disease) on dialysis   Malnutrition of moderate degree   Septic shock   Pseudoaneurysm of femoral artery   Decreased functional mobility   MRSA (methicillin resistant Staphylococcus aureus) septicemia   Pressure injury of skin    (feeding supplement) PROSource Plus  30 mL Oral TID BM   acetaminophen  650 mg Oral Q6H   acetaminophen  650 mg Oral Once   aspirin EC  81 mg Oral Q0600   calcitRIOL  0.25 mcg Oral Q T,Th,Sa-HD   calcium acetate  1,334 mg Oral TID WC   Chlorhexidine Gluconate Cloth  6 each Topical Q0600   darbepoetin (ARANESP) injection - DIALYSIS  100 mcg Subcutaneous Q Wed-1800   feeding supplement (NEPRO CARB STEADY)  237 mL Oral TID BM   HYDROmorphone   Intravenous Q4H   [START ON 09/17/2022] leptospermum manuka honey  1 Application Topical Daily   liver oil-zinc oxide   Topical TID   methocarbamol  500 mg Oral TID   multivitamin  1 tablet Oral QHS  pantoprazole  40 mg Oral Daily   peg 3350 powder  0.5 kit Oral Once   polyethylene glycol  17 g Oral BID   rosuvastatin  10 mg Oral Daily   sodium chloride flush  10-40 mL Intracatheter Q12H   sodium hypochlorite   Irrigation BID    SUBJECTIVE:  Afebrile overnight with no acute events. Continues to have abdominal pain and doing  okay considering all that he has going on.   Allergies  Allergen Reactions   Benadryl [Diphenhydramine] Rash   Lidocaine-Prilocaine Rash    Caused red marks up and down arm   Past Medical History:  Diagnosis Date   Acute metabolic encephalopathy 05/09/2021   Acute on chronic diastolic CHF (congestive heart failure) 12/15/2020   Acute respiratory disease due to COVID-19 virus 06/16/2020   Alcohol use disorder 06/30/2022   Anemia    Asthma    Bacteremia due to Gram-positive bacteria 05/09/2021   Carotid stenosis 09/12/2019   CHF (congestive heart failure)    Claudication in peripheral vascular disease 09/12/2019   Complication of anesthesia    " i HAVE A HARD TIME WAKING UP " (only one time)   COPD (chronic obstructive pulmonary disease)    COVID-19    positive on  06/16/20   Critical limb ischemia of right lower extremity    ESRD on hemodialysis    t, th. sat dialysis   GERD (gastroesophageal reflux disease)    HLD (hyperlipidemia)    Hypertension    Hypertensive urgency 12/24/2016   Lower GI bleed    Nausea vomiting and diarrhea 08/13/2022   Peripheral vascular disease    PFO with atrial septal aneurysm    by echo 11/2017   Pneumonia    Stroke    Surgical wound infection 09/20/2021   [name] (transient ischemic attack) 11/2017   Tobacco abuse    quit 07/17/19   Volume overload 12/14/2020   Past Surgical History:  Procedure Laterality Date   ABDOMINAL AORTOGRAM W/LOWER EXTREMITY N/A 06/21/2020   Procedure: ABDOMINAL AORTOGRAM W/LOWER EXTREMITY;  Surgeon: Chuck Hint, MD;  Location: [facility] INVASIVE CV LAB;  Service: Cardiovascular;  Laterality: N/A;   ABDOMINAL AORTOGRAM W/LOWER EXTREMITY Right 08/29/2021   Procedure: ABDOMINAL AORTOGRAM W/LOWER EXTREMITY;  Surgeon: Leonie Douglas, MD;  Location: [facility] INVASIVE CV LAB;  Service: Cardiovascular;  Laterality: Right;   ABDOMINAL AORTOGRAM W/LOWER EXTREMITY N/A 08/10/2022   Procedure: ABDOMINAL AORTOGRAM W/LOWER EXTREMITY;   Surgeon: Chuck Hint, MD;  Location: [facility] INVASIVE CV LAB;  Service: Cardiovascular;  Laterality: N/A;   AORTOGRAM N/A 08/22/2022   Procedure: AORTOGRAM;  Surgeon: Maeola Harman, MD;  Location: [facility] OR;  Service: Vascular;  Laterality: N/A;   AORTOGRAM Right 09/09/2022   Procedure: AORTOGRAM;  Surgeon: Leonie Douglas, MD;  Location: [facility] OR;  Service: Vascular;  Laterality: Right;   AORTOILIAC BYPASS Left 09/09/2022   Procedure: LEFT COMMON ILIAC TO COMMON FEMORAL  BYPASS W/ COMMON ILIAC STENTING;  Surgeon: Leonie Douglas, MD;  Location: [facility] OR;  Service: Vascular;  Laterality: Left;   APPLICATION OF WOUND VAC Right 09/21/2021   Procedure: APPLICATION OF WOUND VAC;  Surgeon: Chuck Hint, MD;  Location: [facility] OR;  Service: Vascular;  Laterality: Right;   APPLICATION OF WOUND VAC Left 08/28/2022   Procedure: APPLICATION OF PREVENA WOUND VAC WITH PLACEMENT OF MYRIAD MORCELLS;  Surgeon: Victorino Sparrow, MD;  Location: [facility] OR;  Service: Vascular;  Laterality: Left;   ARTERY REPAIR Left 08/28/2022  Procedure: LEFT EXTERNAL ILIAC ARTERY REPAIR;  Surgeon: Victorino Sparrow, MD;  Location: [facility] OR;  Service: Vascular;  Laterality: Left;   AV FISTULA PLACEMENT Left 03/28/2020   Procedure: ARTERIOVENOUS (AV) FISTULA CREATION LEFT;  Surgeon: Cephus Shelling, MD;  Location: [facility] OR;  Service: Vascular;  Laterality: Left;   AV FISTULA PLACEMENT Right 09/23/2020   Procedure: RIGHT Arm ARTERIOVENOUS GRAFT CREATION.;  Surgeon: Chuck Hint, MD;  Location: [facility] OR;  Service: Vascular;  Laterality: Right;   COLONOSCOPY     COLONOSCOPY WITH PROPOFOL N/A 06/27/2020   Procedure: COLONOSCOPY WITH PROPOFOL;  Surgeon: Jeani Hawking, MD;  Location: [facility] ENDOSCOPY;  Service: Endoscopy;  Laterality: N/A;   DRAINAGE AND CLOSURE OF LYMPHOCELE Right 09/21/2021   Procedure: EVACUATION OF LYMPHOCELE RIGHT GROIN;  Surgeon: Chuck Hint, MD;  Location: [facility] OR;  Service: Vascular;  Laterality:  Right;   ENDARTERECTOMY FEMORAL Right 09/05/2021   Procedure: ENDARTERECTOMY FEMORAL;  Surgeon: Chuck Hint, MD;  Location: [facility] OR;  Service: Vascular;  Laterality: Right;   FEMORAL ARTERY EXPLORATION Left 09/09/2022   Procedure: LEFT GROIN EXPLORATION;  Surgeon: Leonie Douglas, MD;  Location: [facility] OR;  Service: Vascular;  Laterality: Left;   FEMORAL-POPLITEAL BYPASS GRAFT Right 09/05/2021   Procedure: RIGHT FEMORAL-POPLITEAL ARTERY BYPASS WITH Spaphenous Vein;  Surgeon: Chuck Hint, MD;  Location: [facility] OR;  Service: Vascular;  Laterality: Right;   FINGER SURGERY     HEMOSTASIS CLIP PLACEMENT  06/27/2020   Procedure: HEMOSTASIS CLIP PLACEMENT;  Surgeon: Jeani Hawking, MD;  Location: [facility] ENDOSCOPY;  Service: Endoscopy;;   INSERTION OF DIALYSIS CATHETER Right 06/24/2020   Procedure: INSERTION OF TUNNELED  DIALYSIS CATHETER; Removal of temporary dialysis catheter right neck;  Surgeon: Chuck Hint, MD;  Location: [facility] OR;  Service: Vascular;  Laterality: Right;   INSERTION OF DIALYSIS CATHETER N/A 09/07/2022   Procedure: INSERTION OF DIALYSIS CATHETER;  Surgeon: Victorino Sparrow, MD;  Location: [facility] OR;  Service: Vascular;  Laterality: N/A;   INSERTION OF ILIAC STENT Left 08/22/2022   Procedure: INSERTION OF ILIAC STENT;  Surgeon: Maeola Harman, MD;  Location: [facility] OR;  Service: Vascular;  Laterality: Left;   IR PARACENTESIS  06/30/2022   LIGATION OF ARTERIOVENOUS  FISTULA Left 07/24/2020   Procedure: LIGATION OF LEFT BRACHIOCEPHALIC ARTERIOVENOUS  FISTULA;  Surgeon: Cephus Shelling, MD;  Location: [facility] OR;  Service: Vascular;  Laterality: Left;   PERIPHERAL VASCULAR INTERVENTION Right 06/21/2020   Procedure: PERIPHERAL VASCULAR INTERVENTION;  Surgeon: Chuck Hint, MD;  Location: [facility] INVASIVE CV LAB;  Service: Cardiovascular;  Laterality: Right;   PERIPHERAL VASCULAR INTERVENTION Left 08/10/2022   Procedure: PERIPHERAL VASCULAR INTERVENTION;  Surgeon: Chuck Hint, MD;  Location: [facility] INVASIVE CV LAB;  Service: Cardiovascular;  Laterality: Left;  External Illiac   REVISION OF ARTERIOVENOUS GORETEX GRAFT Right 09/02/2022   Procedure: REVISION OF RIGHT ARTERIOVENOUS GORETEX GRAFT;  Surgeon: Victorino Sparrow, MD;  Location: [facility] OR;  Service: Vascular;  Laterality: Right;   TEE WITHOUT CARDIOVERSION N/A 05/13/2021   Procedure: TRANSESOPHAGEAL ECHOCARDIOGRAM (TEE);  Surgeon: Little Ishikawa, MD;  Location: [facility] ENDOSCOPY;  Service: Cardiovascular;  Laterality: N/A;   TEE WITHOUT CARDIOVERSION N/A 09/07/2022   Procedure: TRANSESOPHAGEAL ECHOCARDIOGRAM (TEE);  Surgeon: Wendall Stade, MD;  Location: [facility] ENDOSCOPY;  Service: Cardiovascular;  Laterality: N/A;   THROMBECTOMY FEMORAL ARTERY Left 08/22/2022   Procedure: AORTOILIAC THROMBECTOMY, COMMON FEMORAL ENDARTERECTOMY,;  Surgeon: Maeola Harman, MD;  Location: [facility] OR;  Service: Vascular;  Laterality: Left;   ULTRASOUND GUIDANCE FOR VASCULAR ACCESS Right 09/09/2022   Procedure: RIGHT FEMORAL ARTERY ULTRASOUND GUIDANCE FOR VASCULAR ACCESS;  Surgeon: Leonie Douglas, MD;  Location: [facility] OR;  Service: Vascular;  Laterality: Right;   VEIN HARVEST Right 09/05/2021   Procedure: RIGHT Saphenous VEIN HARVEST;  Surgeon: Chuck Hint, MD;  Location: [facility] OR;  Service: Vascular;  Laterality: Right;   VEIN HARVEST Left 09/09/2022   Procedure: VEIN HARVEST OF LEFT GREATER SAPHENOUS VEIN;  Surgeon: Leonie Douglas, MD;  Location: [facility] OR;  Service: Vascular;  Laterality: Left;      Review of Systems: Review of Systems  Constitutional:  Negative for chills, fever and weight loss.  Respiratory:  Negative for cough, shortness of breath and wheezing.   Cardiovascular:  Negative for chest pain and leg swelling.  Gastrointestinal:  Positive for abdominal pain and blood in stool. Negative for constipation, diarrhea, nausea and vomiting.  Skin:  Negative for rash.      OBJECTIVE: Vitals:   09/14/22  0800 09/14/22 0820 09/14/22 0900 09/14/22 1000  BP: (!) 143/65  (!) 150/73 130/64  Pulse:    80  Resp: 16 17 12 16   Temp:      TempSrc:      SpO2:  99%  93%  Weight: 75.2 kg     Height:       Body mass index is 25.21 kg/m.  Physical Exam Constitutional:      General: He is not in acute distress.    Appearance: He is well-developed. He is ill-appearing.  Cardiovascular:     Rate and Rhythm: Normal rate and regular rhythm.     Heart sounds: Normal heart sounds.  Pulmonary:     Effort: Pulmonary effort is normal.     Breath sounds: Normal breath sounds.  Abdominal:     Tenderness: There is abdominal tenderness in the suprapubic area and left lower quadrant.  Skin:    General: Skin is warm and dry.  Neurological:     Mental Status: He is alert and oriented to person, place, and time.  Psychiatric:        Behavior: Behavior normal.        Thought Content: Thought content normal.        Judgment: Judgment normal.     Lab Results Lab Results  Component Value Date   WBC 20.1 (H) 09/14/2022   HGB 8.1 (L) 09/14/2022   HCT 25.4 (L) 09/14/2022   MCV 95.8 09/14/2022   PLT 213 09/14/2022    Lab Results  Component Value Date   CREATININE 5.23 (H) 09/14/2022   BUN 47 (H) 09/14/2022   NA 134 (L) 09/14/2022   K 4.4 09/14/2022   CL 96 (L) 09/14/2022   CO2 21 (L) 09/14/2022    Lab Results  Component Value Date   ALT <5 08/28/2022   AST 21 08/28/2022   ALKPHOS 122 08/28/2022   BILITOT 1.2 08/28/2022     Microbiology: Recent Results (from the past 240 hour(s))  Surgical pcr screen     Status: None   Collection Time: 09/09/22 11:16 AM   Specimen: Nasal Mucosa; Nasal Swab  Result Value Ref Range Status   MRSA, PCR NEGATIVE NEGATIVE Final   Staphylococcus aureus NEGATIVE NEGATIVE Final    Comment: (NOTE) The Xpert SA Assay (FDA approved for NASAL specimens in patients 74 years of age and older), is one component of a comprehensive surveillance program. It is not  intended to diagnose  infection nor to guide or monitor treatment. Performed at [facility], [address], [place], [place] [address]   Aerobic/Anaerobic Culture w Gram Stain (surgical/deep wound)     Status: None (Preliminary result)   Collection Time: 09/09/22  1:28 PM   Specimen: Groin, Left; Wound  Result Value Ref Range Status   Specimen Description GROIN LEFT  Final   Special Requests LEFT GROIN HEMATOMA PT ON VANC ANCEF  Final   Gram Stain   Final    ABUNDANT WBC PRESENT, PREDOMINANTLY PMN RARE GRAM POSITIVE COCCI IN PAIRS Performed at [name], [address] [place], [place] [address]    Culture   Final    RARE METHICILLIN RESISTANT STAPHYLOCOCCUS AUREUS FEW ENTEROCOCCUS FAECIUM VANCOMYCIN RESISTANT ENTEROCOCCUS ISOLATED NO ANAEROBES ISOLATED; CULTURE IN PROGRESS FOR 5 DAYS    Report Status PENDING  Incomplete   Organism ID, Bacteria METHICILLIN RESISTANT STAPHYLOCOCCUS AUREUS  Final   Organism ID, Bacteria ENTEROCOCCUS FAECIUM  Final      Susceptibility   Enterococcus faecium - MIC*    AMPICILLIN >=32 RESISTANT Resistant     VANCOMYCIN >=32 RESISTANT Resistant     GENTAMICIN SYNERGY SENSITIVE Sensitive     * FEW ENTEROCOCCUS FAECIUM   Methicillin resistant staphylococcus aureus - MIC*    CIPROFLOXACIN 1 SENSITIVE Sensitive     ERYTHROMYCIN >=8 RESISTANT Resistant     GENTAMICIN <=0.5 SENSITIVE Sensitive     OXACILLIN >=4 RESISTANT Resistant     TETRACYCLINE <=1 SENSITIVE Sensitive     VANCOMYCIN 1 SENSITIVE Sensitive     TRIMETH/SULFA <=10 SENSITIVE Sensitive     CLINDAMYCIN <=0.25 SENSITIVE Sensitive     RIFAMPIN <=0.5 SENSITIVE Sensitive     Inducible Clindamycin NEGATIVE Sensitive     * RARE METHICILLIN RESISTANT STAPHYLOCOCCUS AUREUS  Aerobic/Anaerobic Culture w Gram Stain (surgical/deep wound)     Status: None (Preliminary result)   Collection Time: 09/09/22  1:28 PM   Specimen: Groin, Left; Wound  Result Value Ref Range Status    Specimen Description TISSUE LEFT GROIN  Final   Special Requests PT ON VANC ANCEF LEFT HEMATOMA  Final   Gram Stain   Final    ABUNDANT WBC PRESENT, PREDOMINANTLY PMN RARE GRAM POSITIVE COCCI IN PAIRS Performed at [facility], [address], [place], [place] [address]    Culture   Final    RARE METHICILLIN RESISTANT STAPHYLOCOCCUS AUREUS RARE ENTEROCOCCUS FAECIUM VANCOMYCIN RESISTANT ENTEROCOCCUS ISOLATED NO ANAEROBES ISOLATED; CULTURE IN PROGRESS FOR 5 DAYS    Report Status PENDING  Incomplete   Organism ID, Bacteria METHICILLIN RESISTANT STAPHYLOCOCCUS AUREUS  Final   Organism ID, Bacteria ENTEROCOCCUS FAECIUM  Final      Susceptibility   Enterococcus faecium - MIC*    AMPICILLIN >=32 RESISTANT Resistant     VANCOMYCIN >=32 RESISTANT Resistant     GENTAMICIN SYNERGY SENSITIVE Sensitive     * RARE ENTEROCOCCUS FAECIUM   Methicillin resistant staphylococcus aureus - MIC*    CIPROFLOXACIN 1 SENSITIVE Sensitive     ERYTHROMYCIN >=8 RESISTANT Resistant     GENTAMICIN <=0.5 SENSITIVE Sensitive     OXACILLIN >=4 RESISTANT Resistant     TETRACYCLINE <=1 SENSITIVE Sensitive     VANCOMYCIN <=0.5 SENSITIVE Sensitive     TRIMETH/SULFA <=10 SENSITIVE Sensitive     CLINDAMYCIN <=0.25 SENSITIVE Sensitive     RIFAMPIN <=0.5 SENSITIVE Sensitive     Inducible Clindamycin NEGATIVE Sensitive     * RARE METHICILLIN RESISTANT  STAPHYLOCOCCUS AUREUS  Aerobic/Anaerobic Culture w Gram Stain (surgical/deep wound)     Status: None (Preliminary result)   Collection Time: 09/09/22  2:48 PM   Specimen: PATH Other; Tissue  Result Value Ref Range Status   Specimen Description WOUND  Final   Special Requests LEFT COMMON ILIAC STENTS PT ON VANC ANCEF  Final   Gram Stain   Final    ABUNDANT WBC PRESENT,BOTH PMN AND MONONUCLEAR FEW GRAM POSITIVE COCCI IN PAIRS Performed at [facility], [address], [place], [place] [address]    Culture   Final    ABUNDANT METHICILLIN RESISTANT  STAPHYLOCOCCUS AUREUS NO ANAEROBES ISOLATED; CULTURE IN PROGRESS FOR 5 DAYS    Report Status PENDING  Incomplete   Organism ID, Bacteria METHICILLIN RESISTANT STAPHYLOCOCCUS AUREUS  Final      Susceptibility   Methicillin resistant staphylococcus aureus - MIC*    CIPROFLOXACIN 1 SENSITIVE Sensitive     ERYTHROMYCIN >=8 RESISTANT Resistant     GENTAMICIN <=0.5 SENSITIVE Sensitive     OXACILLIN >=4 RESISTANT Resistant     TETRACYCLINE <=1 SENSITIVE Sensitive     VANCOMYCIN 1 SENSITIVE Sensitive     TRIMETH/SULFA <=10 SENSITIVE Sensitive     CLINDAMYCIN <=0.25 SENSITIVE Sensitive     RIFAMPIN <=0.5 SENSITIVE Sensitive     Inducible Clindamycin NEGATIVE Sensitive     * ABUNDANT METHICILLIN RESISTANT STAPHYLOCOCCUS AUREUS  Aerobic/Anaerobic Culture w Gram Stain (surgical/deep wound)     Status: None (Preliminary result)   Collection Time: 09/09/22  2:53 PM   Specimen: PATH Vessel; Tissue  Result Value Ref Range Status   Specimen Description TISSUE  Final   Special Requests VESSEL PT ON VANC ANCEF  Final   Gram Stain   Final    NO WBC SEEN RARE GRAM POSITIVE COCCI Performed at [facility], [address], [place], [place] [address]    Culture   Final    RARE STAPHYLOCOCCUS AUREUS SUSCEPTIBILITIES PERFORMED ON PREVIOUS CULTURE WITHIN THE LAST 5 DAYS. NO ANAEROBES ISOLATED; CULTURE IN PROGRESS FOR 5 DAYS    Report Status PENDING  Incomplete   Imaging DG Chest Port 1 View  Result Date: 09/13/2022 CLINICAL DATA:  Fluid overload. EXAM: PORTABLE CHEST 1 VIEW COMPARISON:  08/28/2022 FINDINGS: Stable enlarged cardiac silhouette, mildly prominent upper lung zone pulmonary vasculature and mild chronic interstitial prominence. No Kerley lines, airspace consolidation or pleural fluid. Right jugular catheter tip in the inferior aspect of the superior vena cava near the superior cavoatrial junction. No pneumothorax. Unremarkable bones. IMPRESSION: 1. No acute abnormality. 2. Stable  cardiomegaly, mild pulmonary vascular congestion and mild chronic interstitial lung disease. Electronically Signed   By: Beckie Salts M.D.   On: 09/13/2022 13:02   CT ANGIO GI BLEED  Result Date: 09/13/2022 CLINICAL DATA:  GI bleed. EXAM: CTA ABDOMEN AND PELVIS WITHOUT AND WITH CONTRAST TECHNIQUE: Multidetector CT imaging of the abdomen and pelvis was performed using the standard protocol during bolus administration of intravenous contrast. Multiplanar reconstructed images and MIPs were obtained and reviewed to evaluate the vascular anatomy. RADIATION DOSE REDUCTION: This exam was performed according to the departmental dose-optimization program which includes automated exposure control, adjustment of the mA and/or kV according to patient size and/or use of iterative reconstruction technique. CONTRAST:  OMNIPAQUE IOHEXOL 350 MG/ML SOLN COMPARISON:  09/09/2022 and 09/05/2022 as well as 08/12/2022 and 08/28/2022. FINDINGS: VASCULAR Aorta: Extensive stable 3-4 mm penetrating ulcer along the left posterolateral wall of the infrarenal segment.  Atherosclerotic plaque throughout the abdominal aorta which is normal in caliber. Celiac: Calcified plaque at the origin and throughout. Short segment moderate stenosis near the origin. Otherwise patent. SMA: Significant calcified plaque at the origin calcified and noncalcified plaque proximally causing moderate short segment focal narrowing. SMA is otherwise patent distally. 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 Mild-to-moderate calcified plaque throughout both renal arteries. Right renal artery is patent. IMA: Calcified plaque proximally but otherwise patent. Inflow: Right common and external iliac artery stent present which is patent. Left common iliac artery stent patent. Left external iliac artery is patent. 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  Proximal Outflow: Prominent, but patent right common femoral artery with patent bifurcation. Surgical changes adjacent the common femoral artery. There is mild prominence of the left common femoral artery which is otherwise patent. Patent from the femoral artery and collaterals. Immediate occlusion of the left superficial femoral artery unchanged. Catheter extending anteriorly from the left inguinal region running adjacent the common femoral/external iliac vessels and then along the anterior border of the left iliopsoas muscle. Veins: No obvious venous abnormality within the limitations of this arterial phase study. Review of the MIP images confirms the above findings. NON-VASCULAR Lower chest: Mild stable cardiomegaly. Calcified plaque over the right coronary artery and descending thoracic aorta. Persistent nodular airspace process over the lung bases with largest nodule in the left base measuring 1.1 cm without significant change. Largest nodule over the right base measures 1.1 cm which is slightly smaller. A few other smaller subcentimeter nodules bilaterally unchanged. This nodule airspace process is new since 08/28/2022, but not significantly changed since 09/05/2022. Mild posterior bibasilar dependent atelectasis. No effusion. Hepatobiliary: Liver, gallbladder and biliary tree are normal. Pancreas: Stable homogeneous well-defined oval mass overlying the region of the pancreatic tail measuring approximately 2.4 cm. Spleen: Normal. Adrenals/Urinary Tract: Adrenal glands are normal. Kidneys slightly small with cortical thinning bilaterally. Decreased perfusion of the left kidney compared to the right. No hydronephrosis or focal mass. Bladder is somewhat contracted. Visualized ureters unremarkable. Stomach/Bowel: Stomach and small bowel are normal. Moderate diverticulosis of the colon most notable over the distal descending and sigmoid colon. Appendix is normal. No definite evidence of GI bleed. Lymphatic: No  adenopathy. Reproductive: Normal. Other: Stable retroperitoneal/prevesical space hematoma extending superiorly medially deep to the rectus abdominus muscles. This measures proximally 6 x 11.1 cm. Hematoma also extends along the left iliopsoas muscle without significant change. Musculoskeletal: No focal abnormality. IMPRESSION: VASCULAR: 1. No definite evidence of GI bleed. 2. 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 3. Atherosclerotic plaque throughout the abdominal aorta and branch vessels as described. Short segment moderate stenosis of the proximal celiac artery and SMA. 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 Decreased perfusion of the left kidney compared to the right. 4. Stable retroperitoneal/prevesical space hematoma extending superiorly medially deep to the rectus abdominus muscles with largest measurement 6 x 11.1 cm. Hematoma also extends along the left iliopsoas muscle without significant change. 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 5. 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 NONVASCULAR: 1. No acute findings in the abdomen or pelvis. 2. Persistent nodular airspace process over the lung bases with largest nodule in the left base measuring 1.1 cm without significant change since 09/05/2022. Largest nodule  over the right base measures 1.1 cm which is slightly smaller. A few other smaller subcentimeter nodules bilaterally unchanged. Findings likely infectious/inflammatory in nature. Recommend follow-up CT 6 weeks. 3. 2.4 cm homogeneous well-defined oval mass overlying the region of the pancreatic tail without significant change from 05/05/2021 possibly a splenule. Consider follow-up CT 1 year. 4. Colonic diverticulosis without evidence of acute diverticulitis. 5. Aortic atherosclerosis.  Atherosclerotic coronary artery disease. 6. Stable cardiomegaly. Aortic Atherosclerosis (ICD10-I70.0). Electronically Signed   By: Elberta Fortis M.D.   On: 09/13/2022 10:13   DG OR LOCAL ABDOMEN  Result Date: 09/09/2022 CLINICAL DATA:  Rule out any unintentional retained foreign bodies. EXAM: OR LOCAL ABDOMEN COMPARISON:  September 05, 2022 FINDINGS: Post left common iliac to common femoral bypass. Left femoral line seen. Aortobifemoral stent graft. No radiopaque foreign bodies are seen on this single image. Soft tissue edema noted. IMPRESSION: No radiopaque foreign bodies seen. Electronically Signed   By: Ted Mcalpine M.D.   On: 09/09/2022 17:01   HYBRID OR IMAGING ([facility] ONLY)  Result Date: 09/09/2022 There is no interpretation for this exam.  This order is for images obtained during a surgical procedure.  Please See "Surgeries" Tab for more information regarding the procedure.   CT ANGIO AO+BIFEM W & OR WO CONTRAST  Result Date: 09/09/2022 CLINICAL DATA:  Claudication or leg ischemia EXAM: CT ANGIOGRAPHY OF ABDOMINAL AORTA WITH ILIOFEMORAL RUNOFF TECHNIQUE: Multidetector CT imaging of the abdomen, pelvis and lower extremities was performed using the standard protocol during bolus administration of intravenous contrast. Multiplanar CT image reconstructions and MIPs were obtained to evaluate the vascular anatomy. RADIATION DOSE REDUCTION: This exam was performed according to the departmental dose-optimization program which includes automated exposure control, adjustment of the mA and/or kV according to patient size and/or use of iterative reconstruction technique. CONTRAST:  OMNIPAQUE IOHEXOL 350 MG/ML SOLN COMPARISON:  CT AP, 09/05/2022.  CTA runoff, 08/28/2022. FINDINGS: VASCULAR Aorta: Severe burden of aortic calcified and calcified atherosclerosis. No aneurysm, dissection, vasculitis or significant stenosis. Celiac: Calcified ostial atherosclerosis with short segment proximal celiac artery  stenosis, approximately 50%, with mild post stenotic dilatation. Well opacified celiac axis. No evidence of aneurysm, dissection or vasculitis. SMA: Calcified noncalcified ostial atherosclerosis with 2.9 cm segment of up to severe proximal SMA stenosis. No evidence of aneurysm, dissection or vasculitis. Renals: Single renal arteries are present bilaterally. Severe bilateral renal artery stenoses, greater on LEFT with at least 2 cm proximal occlusion. Severe burden of bilateral calcified atherosclerosis of the segmental and interlobar arteries. No aneurysm or dissection. IMA: Severe burden of ostial atherosclerosis, otherwise distally patent. No evidence of aneurysm, dissection or vasculitis. RIGHT Lower Extremity Inflow: Patent RIGHT CIA and EIA overlapping stents. Occluded RIGHT hypogastric artery. No evidence of aneurysm, dissection or vasculitis. Outflow: Occluded RIGHT native SFA with short segment retrograde reconstitution from the TP trunk to the level of the adductor hiatus. RIGHT femoral-to-popliteal bypass, widely patent. Diseased but patent RIGHT profunda femoris. No evidence of aneurysm or dissection. Runoff: Severely diseased but patent, at least two-vessel, runoff to the ankle. LEFT Lower Extremity Inflow: Patent LEFT CIA and EIA overlapping stents. Occluded LEFT hypogastric artery. New pseudoaneurysm at the distal LEFT EIA, measuring 8 mm and medial to the stent distal landing zone. See key image. No aneurysm or dissection. Outflow: *Near-complete thrombosis of prior pseudoaneurysm at the LEFT CFA pseudoaneurysm. *New linear filling defect extending from the pseudoaneurysm neck and extending peripherally into the distal CFA, measuring up to 3.5 cm. *Patent LEFT profunda  femoris, with occluded LEFT SFA along its entirety. Distal reconstitution of the popliteal artery with diseased but patent trifurcation. *No evidence of aneurysm or dissection. Runoff: Severely diseased but patent, at least two-vessel,  runoff to the ankle. Veins: No obvious venous abnormality within the limitations of this arterial phase study. Review of the MIP images confirms the above findings. NON-VASCULAR Lower chest: No acute abnormality. Calcified multivessel coronary atherosclerosis. Nodular opacities at the lung bases, greater on RIGHT measuring 1.4 cm, similar in appearance to 09/05/2022 comparison though new since 08/12/2022. Hepatobiliary: No focal liver abnormality is seen. No gallstones, gallbladder wall thickening, or biliary dilatation. Pancreas: No pancreatic ductal dilatation or surrounding inflammatory changes. Spleen: Normal in size without focal abnormality. Perihilar accessory spleen, abutting the tail of pancreas and measuring 2.5 cm Adrenals/Urinary Tract: Adrenal glands are unremarkable. Atrophic kidneys. RIGHT perirenal stranding. No renal calculi, focal lesion, or hydronephrosis. Bladder is decompressed. Stomach/Bowel: Stomach is within normal limits. Appendix appears normal. Moderate burden of sigmoid diverticulosis. No evidence of bowel wall thickening, distention, or inflammatory changes. Lymphatic: No enlarged abdominal or pelvic lymph nodes. Reproductive: Prostate is borderline enlarged, otherwise unremarkable. Other: *No abdominal wall hernia. No abdominopelvic ascites. *Retroperitoneal hematoma, extending cephalad along the rectus sheath, and anterior inferiorly to the retropubic and LEFT anterior pelvic spaces, measuring up to 6.4 x 12.2 x 13.0 cm (AP by transaxial by CC). Previously 6.4 x 11.2 cm (AP by transaxial) *Hematoma extension along the LEFT iliopsoas, new since 08/28/2022 comparison Musculoskeletal: No interval osseous abnormality. Bilateral groin cutdown scars, more recently on LEFT with a overlying surgical staples. IMPRESSION: Since CTA runoff dated 08/28/2022; VASCULAR 1. Thrombosis of LEFT CFA pseudoaneurysm with new 3.5 cm filling defect, consistent with arterial thrombus, extending from the PSA  neck and into the distal CFA. This is at risk for distal embolization and potential acute LEFT lower extremity ischemia. 2. New 8 mm distal LEFT EIA pseudoaneurysm, medial to the stent distal landing zone. 3. Mild interval enlargement of retroperitoneal hematoma, in a craniocaudal dimension measuring 12.2 cm (previously 11.2 cm). No CTA evidence of active extravasation. 4. Patent RIGHT femoral-to-popliteal graft and bilateral iliac stents. 5. Severe burden of splanchnic arterial atherosclerosis, at risk for chronic mesenteric ischemia. NON-VASCULAR 1. Bibasilar nodular pulmonary opacities, new since 08/2022, favored infectious/inflammatory. Attention on follow-up. 2. Sigmoid diverticulosis and Aortic Atherosclerosis (ICD10-I70.0). These results will be called to the ordering clinician or representative by the Radiologist Assistant, and communication documented in the PACS or [employer] Dashboard. Roanna BanningJon Mugweru, MD [facility] Electronically Signed   By: Roanna BanningJon  Mugweru M.D.   On: 09/09/2022 11:05   DG C-Arm 1-60 Min  Result Date: 09/07/2022 CLINICAL DATA:  Surgery.  Dialysis catheter EXAM: DG C-ARM 1-60 MIN CONTRAST:  None FLUOROSCOPY: Fluoroscopy Time:  29 seconds Radiation Exposure Index (if provided by the fluoroscopic device): 4.24 mGy Number of Acquired Spot Images: 1 COMPARISON:  None Available. FINDINGS: Catheter in place with indwelling wire. Imaging was obtained to aid in treatment IMPRESSION: Intraoperative fluoroscopy Electronically Signed   By: Karen KaysAshok  Gupta M.D.   On: 09/07/2022 15:53   EP STUDY  Result Date: 09/07/2022 See surgical note for result.  ECHO TEE  Result Date: 09/07/2022    TRANSESOPHOGEAL ECHO REPORT   Patient Name:   Albert GamblesCLYDE M Loe Date of Exam: 09/07/2022 Medical Rec #:  [id number]      Height:       68.0 in Accession #:    [id number]  Weight:       96.8 lb Date of Birth:  December 02, 1955     BSA:          1.501 m Patient Age:    66  years       BP:           148/70 mmHg Patient Gender: M              HR:           75 bpm. Exam Location:  Inpatient Procedure: Transesophageal Echo Indications:    Bacteremia  History:        Patient has prior history of Echocardiogram examinations.                 Signs/Symptoms:Bacteremia.  Sonographer:    [facility] Referring Phys: [id number] Stade PROCEDURE: The transesophogeal probe was passed without difficulty through the esophogus of the patient. Sedation performed by different physician. The patient developed no complications during the procedure.  IMPRESSIONS  1. No vegetations or evidence of SBE.  2. Left ventricular ejection fraction, by estimation, is 50 to 55%. The left ventricle has low normal function. There is severe left ventricular hypertrophy.  3. Right ventricular systolic function is normal. The right ventricular size is normal.  4. Left atrial size was moderately dilated. No left atrial/left atrial appendage thrombus was detected.  5. Right atrial size was moderately dilated.  6. The mitral valve is abnormal. Mild mitral valve regurgitation.  7. The aortic valve is tricuspid. There is moderate calcification of the aortic valve. There is moderate thickening of the aortic valve. Aortic valve regurgitation is not visualized. Mild aortic valve stenosis. FINDINGS  Left Ventricle: Left ventricular ejection fraction, by estimation, is 50 to 55%. The left ventricle has low normal function. The left ventricular internal cavity size was normal in size. There is severe left ventricular hypertrophy. Right Ventricle: The right ventricular size is normal. Right vetricular wall thickness was not assessed. Right ventricular systolic function is normal. Left Atrium: Left atrial size was moderately dilated. No left atrial/left atrial appendage thrombus was detected. Right Atrium: Right atrial size was moderately dilated. Pericardium: There is no evidence of pericardial effusion. Mitral Valve: The mitral valve is  abnormal. There is mild thickening of the mitral valve leaflet(s). Mild mitral annular calcification. Mild mitral valve regurgitation. Tricuspid Valve: The tricuspid valve is normal in structure. Tricuspid valve regurgitation is mild. Aortic Valve: The aortic valve is tricuspid. There is moderate calcification of the aortic valve. There is moderate thickening of the aortic valve. Aortic valve regurgitation is not visualized. Mild aortic stenosis is present. Aortic valve mean gradient measures 9.0 mmHg. Aortic valve peak gradient measures 17.1 mmHg. Aortic valve area, by VTI measures 1.29 cm. Pulmonic Valve: The pulmonic valve was normal in structure. Pulmonic valve regurgitation is trivial. Aorta: The aortic root is normal in size and structure. IAS/Shunts: No atrial level shunt detected by color flow Doppler. Additional Comments: No vegetations or evidence of SBE. LEFT VENTRICLE PLAX 2D LVOT diam:     1.70 cm LV SV:         48 LV SV Index:   32 LVOT Area:     2.27 cm  AORTIC VALVE AV Area (Vmax):    1.45 cm AV Area (Vmean):   1.32 cm AV Area (VTI):     1.29 cm AV Vmax:           207.00 cm/s AV Vmean:  139.000 cm/s AV VTI:            0.376 m AV Peak Grad:      17.1 mmHg AV Mean Grad:      9.0 mmHg LVOT Vmax:         132.00 cm/s LVOT Vmean:        81.000 cm/s LVOT VTI:          0.213 m LVOT/AV VTI ratio: 0.57  SHUNTS Systemic VTI:  0.21 m Systemic Diam: 1.70 cm Charlton Haws MD Electronically signed by Charlton Haws MD Signature Date/Time: 09/07/2022/12:28:06 PM    Final    VAS US CAROTID  Result Date: 09/07/2022 Carotid Arterial Duplex Study Patient Name:  JOANN KULPA  Date of Exam:   09/05/2022 Medical Rec #: [id number]       Accession #:    [id number] Date of Birth: 26-Aug-1955      Patient Gender: M Patient Age:   46 years Exam Location:  [facility] Procedure:      VAS US CAROTID Referring Phys: Terrilee Files [facility] --------------------------------------------------------------------------------   Indications:       CVA. Risk Factors:      Hypertension, hyperlipidemia, past history of smoking, PAD. Other Factors:     ESRD on dialysis. Limitations        Today's exam was limited due to Patient condition.                    Somnolence. and heavy calcification and the resulting                    shadowing. Comparison Study:  Prior carotid duplex done 10/02/19 Performing Technologist: Sherren Kerns RVS  Examination Guidelines: A complete evaluation includes B-mode imaging, spectral Doppler, color Doppler, and power Doppler as needed of all accessible portions of each vessel. Bilateral testing is considered an integral part of a complete examination. Limited examinations for reoccurring indications may be performed as noted.  Right Carotid Findings: +----------+--------+--------+--------+----------------------+--------+           PSV cm/sEDV cm/sStenosisPlaque Description    Comments +----------+--------+--------+--------+----------------------+--------+ CCA Prox  112     17              heterogenous                   +----------+--------+--------+--------+----------------------+--------+ CCA Distal122     14              heterogenous                   +----------+--------+--------+--------+----------------------+--------+ ICA Prox  215     29      40-59%  calcific and irregulartortuous +----------+--------+--------+--------+----------------------+--------+ ICA Mid   126     22              calcific                       +----------+--------+--------+--------+----------------------+--------+ ICA Distal154     26                                             +----------+--------+--------+--------+----------------------+--------+ ECA       47      3                                              +----------+--------+--------+--------+----------------------+--------+ +----------+--------+-------+------------+-------------------+  PSV cm/sEDV cmsDescribe     Arm Pressure (mmHG) +----------+--------+-------+------------+-------------------+ Subclavian               Not assessed                    +----------+--------+-------+------------+-------------------+ +---------+--------+--------+------------+ VertebralPSV cm/sEDV cm/sNot assessed +---------+--------+--------+------------+  Left Carotid Findings: +----------+--------+--------+--------+----------------------+--------+           PSV cm/sEDV cm/sStenosisPlaque Description    Comments +----------+--------+--------+--------+----------------------+--------+ CCA Prox  99      13              calcific                       +----------+--------+--------+--------+----------------------+--------+ CCA Mid   117     13              calcific                       +----------+--------+--------+--------+----------------------+--------+ CCA Distal111     12              calcific                       +----------+--------+--------+--------+----------------------+--------+ ICA Prox  199     24      40-59%  calcific and irregular         +----------+--------+--------+--------+----------------------+--------+ ICA Mid   194     28                                             +----------+--------+--------+--------+----------------------+--------+ ICA Distal69      11                                             +----------+--------+--------+--------+----------------------+--------+ ECA       271     16              calcific                       +----------+--------+--------+--------+----------------------+--------+ +----------+--------+--------+--------+-------------------+           PSV cm/sEDV cm/sDescribeArm Pressure (mmHG) +----------+--------+--------+--------+-------------------+ [id number]                                         +----------+--------+--------+--------+-------------------+ +---------+--------+--+--------+--+---------+ VertebralPSV  cm/s83EDV cm/s16Antegrade +---------+--------+--+--------+--+---------+   Summary: Right Carotid: Velocities in the right ICA are consistent with a 40-59%                stenosis, no significant change since prior study. Left Carotid: Velocities in the left ICA are consistent with a 40-59% stenosis,               no significant change since prior study. Vertebrals:  Left vertebral artery demonstrates antegrade flow. Subclavians: Normal flow hemodynamics were seen in the left subclavian artery. *See table(s) above for measurements and observations.  Electronically signed by Delia Heady MD on 09/07/2022 at 8:06:03 AM.    Final    VAS [place] LOWER EXTREMITY ARTERIAL DUPLEX  Result Date: 09/05/2022 LOWER EXTREMITY ARTERIAL DUPLEX STUDY Patient Name:  DEWIE AHART  Date of Exam:   09/05/2022 Medical Rec #: [id number]       Accession #:    [id number] Date of Birth: Oct 22, 1955      Patient Gender: M Patient Age:   84 years Exam Location:  [facility] Procedure:      VAS [place] LOWER EXTREMITY ARTERIAL DUPLEX Referring Phys: Scheryl Marten XU --------------------------------------------------------------------------------  Indications: Peripheral artery disease, and Acute pain in lower back and left              leg. Anterior pelvic extraperitoneal hematoma within the prevesical              space. Right > left extension of hemorrhage within the              extraperitoneal lateral pelvic walls by CT today. High Risk Factors: Hypertension, hyperlipidemia, prior CVA. Other Factors: ESRD on dialysis,.  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 Current ABI:            n/a Comparison Study: Prior duplex of right fem-pop bypass done 08/06/22 indicating                   patent bypass Performing Technologist: Sherren Kerns RVS  Examination Guidelines: A complete evaluation includes  B-mode imaging, spectral Doppler, color Doppler, and power Doppler as needed of all accessible portions of each vessel. Bilateral testing is considered an integral part of a complete examination. Limited examinations for reoccurring indications may be performed as noted.  +----------+--------+-----+---------------+--------+--------+ RIGHT     PSV cm/sRatioStenosis       WaveformComments +----------+--------+-----+---------------+--------+--------+ ATA Mid   169          30-49% stenosis                 +----------+--------+-----+---------------+--------+--------+ ATA Distal147                                          +----------+--------+-----+---------------+--------+--------+ PTA Prox  114                                          +----------+--------+-----+---------------+--------+--------+ PTA Mid   116                                          +----------+--------+-----+---------------+--------+--------+ PTA Distal154          30-49% stenosis                 +----------+--------+-----+---------------+--------+--------+ DP        0                                            +----------+--------+-----+---------------+--------+--------+  Right Graft #1: Fem-Pop +------------------+--------+--------+----------+-----------+  PSV cm/sStenosisWaveform  Comments    +------------------+--------+--------+----------+-----------+ Inflow            130             monophasic            +------------------+--------+--------+----------+-----------+ Prox Anastomosis  110                       multiphasic +------------------+--------+--------+----------+-----------+ Proximal Graft    99                                    +------------------+--------+--------+----------+-----------+ Mid Graft         88                                    +------------------+--------+--------+----------+-----------+ Distal Graft      86                                     +------------------+--------+--------+----------+-----------+ Distal Anastomosis116                                   +------------------+--------+--------+----------+-----------+ Outflow                                                 +------------------+--------+--------+----------+-----------+   +-----------+--------+-----+--------+-------------------+-----------------+ LEFT       PSV cm/sRatioStenosisWaveform           Comments          +-----------+--------+-----+--------+-------------------+-----------------+ CFA Prox   88                   monophasic                           +-----------+--------+-----+--------+-------------------+-----------------+ DFA        82                   monophasic                           +-----------+--------+-----+--------+-------------------+-----------------+ SFA Prox                occluded                                     +-----------+--------+-----+--------+-------------------+-----------------+ SFA Mid                 occluded                                     +-----------+--------+-----+--------+-------------------+-----------------+ SFA Distal                      monophasic         collateral flow   +-----------+--------+-----+--------+-------------------+-----------------+ POP Prox   34  monophasic                           +-----------+--------+-----+--------+-------------------+-----------------+ POP Distal 33                   monophasic                           +-----------+--------+-----+--------+-------------------+-----------------+ ATA Prox   23                   dampened monophasic                  +-----------+--------+-----+--------+-------------------+-----------------+ ATA Mid    26                   dampened monophasiccollaterals noted +-----------+--------+-----+--------+-------------------+-----------------+ ATA Distal                       dampened monophasic                  +-----------+--------+-----+--------+-------------------+-----------------+ PTA Prox   26                   dampened monophasic                  +-----------+--------+-----+--------+-------------------+-----------------+ PTA Mid    26                   dampened monophasic                  +-----------+--------+-----+--------+-------------------+-----------------+ PTA Distal 28                   dampened monophasic                  +-----------+--------+-----+--------+-------------------+-----------------+ PERO Prox  20                   dampened monophasic                  +-----------+--------+-----+--------+-------------------+-----------------+ PERO Mid   34                   dampened monophasic                  +-----------+--------+-----+--------+-------------------+-----------------+ PERO Distal29                   dampened monophasiccollateral flow   +-----------+--------+-----+--------+-------------------+-----------------+ DP                              not visualized                       +-----------+--------+-----+--------+-------------------+-----------------+  Summary: Right: No significant change compared to previous study. Patent fem-pop bypass graft. 30-49% stenosis noted in the mid ATA and distal PTA. Left: Chronically occluded SFA. Monophasic and dampened monophasic flow noted in the popliteal, posterior tibial, anterior tibial, and peroneal arteries. Collateral flow noted in the distal SFA, mid ATA, and distal peroneal.  See table(s) above for measurements and observations. Electronically signed by Coral Else MD on 09/05/2022 at 9:11:41 PM.    Final    CT ABDOMEN PELVIS WO CONTRAST  Result Date: 09/05/2022 CLINICAL DATA:  Rule out retroperitoneal bleed. EXAM: CT ABDOMEN AND PELVIS WITHOUT CONTRAST TECHNIQUE: Multidetector CT imaging of the abdomen and pelvis  was performed following the  standard protocol without IV contrast. RADIATION DOSE REDUCTION: This exam was performed according to the departmental dose-optimization program which includes automated exposure control, adjustment of the mA and/or kV according to patient size and/or use of iterative reconstruction technique. COMPARISON:  08/12/2022 FINDINGS: Lower chest: Emphysema. Development of areas of nodular airspace disease within both lower lobes since 08/12/2022. Mild cardiomegaly. Distal right coronary artery calcification. Hypoattenuation in the intravascular space suggests anemia. Trace left pleural fluid is similar. Hepatobiliary: Normal liver. Normal gallbladder, without biliary ductal dilatation. Pancreas: Soft tissue density within the pancreatic tail of 2.1 cm in 23/3 is most consistent with a splenule, when correlated with contrast enhanced exam. No duct dilatation or acute inflammation. Spleen: Otherwise normal spleen. Adrenals/Urinary Tract: Normal adrenal glands. Marked renal cortical thinning bilaterally. Renal vascular calcifications. No hydronephrosis. The bladder is decompressed, without calcified stone. Stomach/Bowel: Normal stomach, without wall thickening. Scattered colonic diverticula. Normal terminal ileum and appendix. Normal small bowel. Vascular/Lymphatic: Advanced aortic and branch vessel atherosclerosis. Bilateral iliac stent grafts. No abdominopelvic adenopathy. Reproductive: Normal prostate. Other: No free intraperitoneal air. No intraperitoneal fluid identified. Hemorrhage within the anterior extraperitoneal pelvic prevesical space at maximally 10.3 x 6.3 cm on 35/3. This is contiguous with right pelvic extraperitoneal hematoma at 4.7 x 3.3 cm on 71/3. Increase in left pelvic complex fluid which is also presumably hemorrhage on 68/3. There is adjacent gas as well as gas and fluid in the left groin, new on 75/3-likely iatrogenic. Musculoskeletal: Posterolateral right ninth rib nonacute but incompletely healed  fracture. Transitional S1 vertebral body. IMPRESSION: 1. Anterior pelvic extraperitoneal hematoma within the prevesical space. Right-greater-than-left extension of hemorrhage within the extraperitoneal lateral pelvic walls. 2. Trace left pleural fluid. 3. Development of nodular airspace disease since 08/12/2022, favoring infection or aspiration. 4. Coronary artery atherosclerosis. Aortic Atherosclerosis (ICD10-I70.0). Emphysema (ICD10-J43.9). These results will be called to the ordering clinician or representative by the Radiologist Assistant, and communication documented in the PACS or [employer]. Electronically Signed   By: Jeronimo Greaves M.D.   On: 09/05/2022 13:25   MR CERVICAL SPINE WO CONTRAST  Result Date: 09/05/2022 CLINICAL DATA:  Myelopathy, acute, cervical spine. EXAM: MRI CERVICAL SPINE WITHOUT CONTRAST TECHNIQUE: Multiplanar, multisequence MR imaging of the cervical spine was performed. No intravenous contrast was administered. COMPARISON:  Radiographs 05/11/2022, CT 08/07/2021 and MRI of the cervical spine 06/25/2022. FINDINGS: Despite efforts by the technologist and patient, mild to moderate motion artifact is present on today's exam and could not be eliminated. This reduces exam sensitivity and specificity. Alignment: Straightening without focal angulation or listhesis. Vertebrae: Interval improvement in the previously demonstrated abnormalities of the C5 and C6 vertebral bodies and intervening C5-6 disc. There is less marrow edema and less surrounding inflammatory change. No new disc space findings or acute osseous abnormalities. Cord: Stable cord flattening and possible T2 hyperintensity in the cord at the C3-4 and C5-6 levels. No evidence of cord hemorrhage. Posterior Fossa, vertebral arteries, paraspinal tissues: Intracranial findings are dictated separatelybilateral vertebral artery flow voids. The prevertebral inflammatory changes seen previously have significantly improved in the  interval. No paraspinal fluid collection identified. Oval T2 hyperintensity within the left mandible was not previously imaged and could reflect osteomyelitis. Disc levels: C2-3: No significant findings. C3-4: Stable spondylosis with loss of disc height and posterior osteophytes covering diffusely bulging disc material. There is uncinate spurring and facet hypertrophy bilaterally contributing to stable mild cord flattening and foraminal narrowing bilaterally. There is possible mild cord T2 hyperintensity, grossly stable.  C4-5: Mild spondylosis with mild disc bulging and facet hypertrophy. No cord deformity or high-grade foraminal narrowing. C5-6: Chronic spondylosis with loss of disc height and posterior osteophytes covering diffusely bulging disc material. The AP diameter of the canal is narrowed to approximately 5 mm with unchanged chronic cord compression. Moderate foraminal narrowing bilaterally. As above, the previously demonstrated paraspinal inflammatory changes and marrow edema at this level have improved. C6-7: Stable spondylosis with posterior osteophytes covering diffusely bulging disc material. Mild spinal stenosis and mild foraminal narrowing bilaterally. C7-T1: Stable mild disc bulging and mild uncinate spurring bilaterally. No cord deformity. Mild foraminal narrowing bilaterally. T1-2: Normal interspace. IMPRESSION: 1. Compared with previous MRI from 06/25/2022, there has been significant improvement in the previously demonstrated paraspinal inflammatory changes and marrow edema at the C5-6 level, suggesting improving discitis/osteomyelitis. Correlate clinically. No residual paraspinal fluid collection identified. 2. Similar multilevel cervical spondylosis, most advanced at C5-6 where there is chronic cord compression and moderate foraminal narrowing bilaterally. Possible mild cord T2 hyperintensity at C3-4 and C5-6. 3. T2 hyperintensity in the left body of the mandible, not previously imaged and  potentially reflecting osteomyelitis. Correlate clinically. 4. No acute findings identified. Electronically Signed   By: Carey Bullocks M.D.   On: 09/05/2022 12:37   MR ANGIO HEAD WO CONTRAST  Result Date: 09/05/2022 CLINICAL DATA:  Acute cervical myelopathy EXAM: MRA HEAD WITHOUT CONTRAST TECHNIQUE: Angiographic images of the Circle of Willis were acquired using MRA technique without intravenous contrast. COMPARISON:  Brain MRI from yesterday FINDINGS: Pervasive motion artifact. The left vertebral artery is strongly dominant with thready flow in the right vertebral artery accentuated by motion. No branch occlusion, beading, or aneurysm. No flow limiting stenosis. IMPRESSION: Motion degraded MRA without acute finding. Irregular appearance of the right vertebral artery partially related to motion. Electronically Signed   By: Tiburcio Pea M.D.   On: 09/05/2022 11:54   MR LUMBAR SPINE WO CONTRAST  Result Date: 09/04/2022 CLINICAL DATA:  LLE EXAM: MRI LUMBAR SPINE WITHOUT CONTRAST TECHNIQUE: Multiplanar, multisequence MR imaging of the lumbar spine was performed. No intravenous contrast was administered. COMPARISON:  None Available. FINDINGS: Limited due to motion/artifact. Segmentation: Standard segmentation is assumed. The inferior-most fully formed intervertebral disc is labeled L5-S1. Alignment:  No substantial sagittal subluxation. Vertebrae: Heterogeneous bone marrow. No focal edema to suggest acute fracture or discitis/osteomyelitis. No focal suspicious bone lesion. Conus medullaris and cauda equina: Conus extends to the superior L2 level. Conus appears normal. Paraspinal and other soft tissues: Limited visualization without obvious acute abnormality. Disc levels: T12-L1: No significant disc protrusion, foraminal stenosis, or canal stenosis. L1-L2: No significant disc protrusion, foraminal stenosis, or canal stenosis. L2-L3: No significant disc protrusion, foraminal stenosis, or canal stenosis. L3-L4:  Mild disc bulging and facet arthropathy. No significant stenosis. L4-L5: No significant disc protrusion, foraminal stenosis, or canal stenosis. L5-S1: Facet arthropathy. Small left paracentral disc protrusion which comes in close proximity to the exiting left L5 nerve without impingement. No significant stenosis. IMPRESSION: 1. Motion limited study without evidence of significant canal or foraminal stenosis. 2. At L5-S1, small left paracentral disc protrusion which comes in close proximity to the exiting left L5 nerve without impingement. Electronically Signed   By: Feliberto Harts M.D.   On: 09/04/2022 12:19   MR BRAIN WO CONTRAST  Result Date: 09/04/2022 CLINICAL DATA:  Provided history: Altered mental status, left lower extremity weakness. EXAM: MRI HEAD WITHOUT CONTRAST TECHNIQUE: Multiplanar, multiecho pulse sequences of the brain and surrounding structures were obtained without intravenous  contrast. COMPARISON:  Head CT 08/30/2022. FINDINGS: Intermittently motion degraded examination, limiting evaluation. Most notably, the sagittal T1 sequence is moderately motion degraded and the least motion degraded axial T2 sequence is moderately motion degraded. Within this limitation, findings are as follows. Brain: Moderate generalized cerebral atrophy. Mild cerebellar atrophy. 4 mm acute infarct within the right subinsular white matter (series 2, images 22 and 23) (series 3, image 19). Redemonstrated chronic lacunar infarct within the right basal ganglia. Background multifocal T2 FLAIR hyperintense signal abnormality within the cerebral white matter, nonspecific but compatible with mild chronic small vessel ischemic disease. These findings are similar to the prior brain MRI of 05/09/2021. As before, there are a few chronic microhemorrhages scattered within the supratentorial brain. Redemonstrated tiny chronic lacunar infarct within the central pons, and small chronic infarcts within the left cerebellar  hemisphere. No evidence of an intracranial mass. No extra-axial fluid collection. No midline shift. Vascular: As before, there is signal abnormality within the intracranial right vertebral artery suggesting high-grade stenosis or vessel occlusion. Skull and upper cervical spine: No focal suspicious marrow lesion. Incompletely assessed cervical spondylosis. Sinuses/Orbits: No mass or acute finding within the imaged orbits. No more than trace paranasal sinus mucosal thickening. IMpression #2 will be called to the ordering clinician or representative by the Radiologist Assistant, and communication documented in the PACS or [employer]. IMPRESSION: 1. Motion degraded examination. 2. 4 mm acute infarct within the right subinsular white matter. 3. Parenchymal atrophy, chronic small vessel ischemic disease and chronic infarcts as described. 4. Few chronic microhemorrhages within the supratentorial brain. 5. Chronic signal abnormality within the intracranial right vertebral artery suggesting high-grade stenosis or vessel occlusion. Electronically Signed   By: Jackey Loge D.O.   On: 09/04/2022 12:01   VAS [place] LOWER EXTREMITY SAPHENOUS VEIN MAPPING  Result Date: 08/31/2022 LOWER EXTREMITY VEIN MAPPING Patient Name:  CARLAS VANDYNE  Date of Exam:   08/31/2022 Medical Rec #: [id number]       Accession #:    [id number] Date of Birth: 03/02/1956      Patient Gender: M Patient Age:   63 years Exam Location:  [facility] Procedure:      VAS [place] LOWER EXTREMITY SAPHENOUS VEIN MAPPING Referring Phys: Ivin Booty ROBINS --------------------------------------------------------------------------------  Indications:  Pre-op Risk Factors: PAD.  Limitations: Right Fem-pop bypass graft with saphenous vein 09/05/21 Comparison Study: Prior vein mapping done 09/18/20 Performing Technologist: Sherren Kerns RVS  Examination Guidelines: A complete evaluation includes B-mode imaging, spectral Doppler, color Doppler, and power Doppler as  needed of all accessible portions of each vessel. Bilateral testing is considered an integral part of a complete examination. Limited examinations for reoccurring indications may be performed as noted. +----------+--------------+-----------------------+----------------+-----------+     RT     RT Findings            GSV          LT Diameter (cm)LT Findings  Diameter                                                                     (cm)                                                                    +----------+--------------+-----------------------+----------------+-----------+  Harvested   Saphenofemoral Junction      0.67                  +----------+--------------+-----------------------+----------------+-----------+             Harvested       Proximal thigh           0.48                  +----------+--------------+-----------------------+----------------+-----------+             Harvested          Mid thigh             0.40                  +----------+--------------+-----------------------+----------------+-----------+             Harvested        Distal thigh         0.44/0.31     branching  +----------+--------------+-----------------------+----------------+-----------+           not visualized         Knee                0.22                  +----------+--------------+-----------------------+----------------+-----------+           not visualized       Prox calf             0.21                  +----------+--------------+-----------------------+----------------+-----------+           not visualized       Mid calf            0.24/1.8     branching  +----------+--------------+-----------------------+----------------+-----------+           not visualized      Distal calf         0.360/.09     branching  +----------+--------------+-----------------------+----------------+-----------+           not visualized         Ankle             0.15/0.12     branching  +----------+--------------+-----------------------+----------------+-----------+ Diagnosing physician: Waverly Ferrari MD Electronically signed by Waverly Ferrari MD on 08/31/2022 at 6:30:54 PM.    Final    VAS US DUPLEX DIALYSIS ACCESS (AVF, AVG)  Result Date: 08/31/2022 DIALYSIS ACCESS Patient Name:  VASILIS LUHMAN  Date of Exam:   08/31/2022 Medical Rec #: [id number]       Accession #:    [id number] Date of Birth: 1955-09-09      Patient Gender: M Patient Age:   68 years Exam Location:  [facility] Procedure:      VAS US DUPLEX DIALYSIS ACCESS (AVF, AVG) Referring Phys: Louie Bun --------------------------------------------------------------------------------  Reason for Exam: No palpable thrill for AVF/AVG. Access Site: Right Upper Extremity. Access Type: Forearm loop AVG. Comparison Study: No prior study Performing Technologist: Sherren Kerns RVS  Examination Guidelines: A complete evaluation includes B-mode imaging, spectral Doppler, color Doppler, and power Doppler as needed of all accessible portions of each vessel. Unilateral testing is considered an integral part of a complete examination. Limited examinations for reoccurring indications may be performed as noted.  Findings:   Thrombosed AV graft  Summary: Thrombosed right AV graft  *See table(s) above for measurements and observations.  Diagnosing physician: Cristal Deer  Edilia Bo MD Electronically signed by Waverly Ferrari MD on 08/31/2022 at 6:30:35 PM.    --------------------------------------------------------------------------------   Final    ECHOCARDIOGRAM LIMITED  Result Date: 08/31/2022    ECHOCARDIOGRAM LIMITED REPORT   Patient Name:   DIA JEFFERYS Date of Exam: 08/31/2022 Medical Rec #:  [id number]      Height:       68.0 in Accession #:    [id number]     Weight:       144.4 lb Date of Birth:  1955-10-04     BSA:          1.780 m Patient Age:    43 years       BP:           165/52  mmHg Patient Gender: M              HR:           79 bpm. Exam Location:  Inpatient Procedure: Limited Echo, Cardiac Doppler and Limited Color Doppler Indications:    Bacteremia R78.81  History:        Patient has prior history of Echocardiogram examinations, most                 recent 06/29/2022. 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  Sonographer:    Aron Baba Referring Phys: Val Riles SOOD  Sonographer Comments: Image acquisition challenging due to respiratory motion. IMPRESSIONS  1. Left ventricular ejection fraction, by estimation, is 45%. There is moderate to severe concentric left ventricular hypertrophy. Diastolic function was not assessed.  2. Right ventricular systolic function is normal.  3. The mitral valve is grossly normal with mild thickening of the mitral valve leaflets. Mild mitral valve regurgitation.  4. Right atrial size was mildly dilated.  5. Left atrial size was mildly dilated.  6. The inferior vena cava is normal in size with greater than 50% respiratory variability, suggesting right atrial pressure of 3 mmHg.  7. The aortic valve was not well visualized. There is mild calcification of the aortic valve. There is mild thickening of the aortic valve. Aortic valve regurgitation is not visualized. FINDINGS  Left Ventricle: Left ventricular ejection fraction, by estimation, is 45 to 50%. The left ventricle has mildly decreased function. There is severe concentric left ventricular hypertrophy. Left ventricular diastolic function could not be evaluated. Right Ventricle: No increase in right ventricular wall thickness. Right ventricular systolic function is normal. Left Atrium: Left atrial size was mildly dilated. Right Atrium: Right atrial size was mildly dilated. Pericardium: There is no evidence of pericardial effusion. Mitral Valve: The mitral  valve is grossly normal. There is mild thickening of the mitral valve leaflet(s). Mild mitral annular calcification. Mild mitral valve regurgitation. Tricuspid Valve: The tricuspid valve is normal in structure. Tricuspid valve regurgitation is mild. Aortic Valve: The aortic valve was not well visualized. There is mild calcification of the aortic valve. There is mild thickening of the aortic valve. Aortic valve regurgitation is not visualized. Pulmonic Valve: The pulmonic valve was not assessed. Venous: The inferior vena cava is normal in size with greater than 50% respiratory variability, suggesting right atrial pressure of 3 mmHg. Additional Comments: Spectral Doppler performed. Color Doppler performed.  LEFT VENTRICLE PLAX 2D LVIDd:         4.00 cm LVIDs:         3.00 cm LV PW:         1.60 cm LV IVS:        1.10 cm  LEFT ATRIUM         Index LA diam:    2.80 cm 1.57 cm/m  MITRAL VALVE MV Area (PHT): 2.73 cm MV Decel Time: 278 msec MV E velocity: 72.80 cm/s MV A velocity: 99.00 cm/s MV E/A ratio:  0.74 Aditya Sabharwal Electronically signed by Dorthula Nettles Signature Date/Time: 08/31/2022/2:22:32 PM    Final    CT HEAD WO CONTRAST ( )  Result Date: 08/30/2022 CLINICAL DATA:  Initial evaluation for mental status change. EXAM: CT HEAD WITHOUT CONTRAST TECHNIQUE: Contiguous axial images were obtained from the base of the skull through the vertex without intravenous contrast. RADIATION DOSE REDUCTION: This exam was performed according to the departmental dose-optimization program which includes automated exposure control, adjustment of the mA and/or kV according to patient size and/or use of iterative reconstruction technique. COMPARISON:  Prior CT from 08/07/2021. FINDINGS: Brain: Age-related cerebral atrophy with mild chronic small vessel ischemic disease. Chronic right basal ganglia lacunar infarct. No acute intracranial hemorrhage. No acute large vessel territory infarct. No mass lesion or midline shift.  No hydrocephalus or extra-axial fluid collection. Vascular: No abnormal hyperdense vessel. Scattered calcified atherosclerosis about the skull base, stable. Skull: Scalp soft tissues and calvarium demonstrate no acute finding. Sinuses/Orbits: Globes and orbital soft tissues within normal limits. Paranasal sinuses are clear. Trace right mastoid effusion noted, of doubtful significance. Other: None. IMPRESSION: 1. No acute intracranial abnormality. 2. Age-related cerebral atrophy with mild chronic small vessel ischemic disease, with small remote right basal ganglia lacunar infarct, stable. Electronically Signed   By: Rise Mu M.D.   On: 08/30/2022 19:21   DG Abd Portable 1V  Result Date: 08/29/2022 CLINICAL DATA:  Orogastric tube placement EXAM: PORTABLE ABDOMEN - 1 VIEW COMPARISON:  Portable exam 1228 hours compared to 1040 hours FINDINGS: Tip of nasogastric tube projects over distal gastric antrum/pylorus. Lung bases clear. Nonobstructive bowel gas pattern. IMPRESSION: Tip of nasogastric tube projects over distal gastric antrum/pylorus. Electronically Signed   By: Ulyses Southward M.D.   On: 08/29/2022 12:45   DG Abd Portable 1V  Result Date: 08/29/2022 CLINICAL DATA:  Orogastric tube placement EXAM: PORTABLE ABDOMEN - 1 VIEW COMPARISON:  08/12/2022 CT abdomen FINDINGS: The orogastric tube distal tip is in the stomach body with side port in the gastric fundus. Borderline enlargement of the cardiopericardial silhouette. Formed stool in the visualized colon. Mild lumbar spondylosis. Enlargement of the cardiopericardial silhouette IMPRESSION: 1. Orogastric tube tip is in the stomach body with side port in the gastric fundus. 2. Borderline enlargement of the cardiopericardial silhouette. Electronically Signed   By: Gaylyn Rong M.D.   On: 08/29/2022 10:52   DG Chest Portable 1 View  Result Date: 08/28/2022 CLINICAL DATA:  Central line placement EXAM: PORTABLE CHEST 1 VIEW COMPARISON:  08/12/2022  FINDINGS: Right IJ line with tip at the upper SVC. Artifact from EKG leads. Nipple shadows asymmetric to the left, confirmed on recent abdominal CT covering the lung bases. There is no edema, consolidation, effusion, or pneumothorax. Normal heart size. IMPRESSION: Right IJ line without complicating feature. Electronically Signed   By: Tiburcio Pea M.D.   On: 08/28/2022 21:41   CT Angio Aortobifemoral W and/or Wo Contrast  Result Date: 08/28/2022  CLINICAL DATA:  Claudication or leg ischemia EXAM: CT ANGIOGRAPHY OF ABDOMINAL AORTA WITH ILIOFEMORAL RUNOFF TECHNIQUE: Multidetector CT imaging of the abdomen, pelvis and lower extremities was performed using the standard protocol during bolus administration of intravenous contrast. Multiplanar CT image reconstructions and MIPs were obtained to evaluate the vascular anatomy. RADIATION DOSE REDUCTION: This exam was performed according to the departmental dose-optimization program which includes automated exposure control, adjustment of the mA and/or kV according to patient size and/or use of iterative reconstruction technique. CONTRAST:  85mL OMNIPAQUE IOHEXOL 350 MG/ML SOLN COMPARISON:  CT 08/12/2022 and previous FINDINGS: VASCULAR Aorta: Extensive partially calcified atheromatous plaque throughout. No aneurysm, dissection, or stenosis. 4 mm penetrating atheromatous ulcer along the left posterolateral wall in the infrarenal segment. Celiac: Calcified ostial plaque resulting in short segment stenosis of moderate severity, atheromatous but patent distally. SMA: Significant partially calcified atheromatous plaque extending from the ostium approximally 2.4 cm, with progression to at least moderately severe stenosis since 01/01/2022, with continued patency distally with classic distal branch anatomy. Renals: Single left, with near occlusive ostial plaque extending over length of at least 1.2 cm, patent but atheromatous distally. Single right, with heavily calcified  atheromatous plaque through its length , of likely hemodynamic significance. IMA: Atheromatous but patent. RIGHT Lower Extremity Inflow: Common iliac proximal stent patent, distal stent with mild residual/recurrent narrowing at its proximal end, patent across the bifurcation. Internal iliac occluded at its origin External iliac overlapping stents through nearly its length, with moderate residual/recurrent stenosis at the distal margin at the common femoral artery. Outflow: Common femoral is mildly atheromatous but patent distally. Deep femoral branches patent proximally, segmentally occluded distally. SFA proximal occlusion extending throughz its length. Patent fem-pop graft, with some retrograde filling of the proximal native popliteal artery. Runoff: Venous opacification in the proximal calf limited evaluation of proximal tibial runoff. Scattered atheromatous calcifications and poor enhancement in the distal calf limiting assessment of distal trifurcation runoff. LEFT Lower Extremity Inflow: Common iliac patent stents through its length Internal iliac origin occlusion External iliac patent stent through its length. Outflow: Common femoral has 1.1 cm thin-necked pseudoaneurysm from its proximal segment, patent distally. Deep femoral branches patent SFA origin occlusion obtaining throughout its length despite presence of stents. Popliteal is reconstituted proximally by collaterals, patent but moderately atheromatous through its distal length. Runoff: Atheromatous but apparently contiguous three-vessel tibial runoff to above the ankle, with extensive vascular calcifications and limited enhancement limiting assessment across the ankle and into the foot. Veins: No obvious venous abnormality within the limitations of this arterial phase study. Review of the MIP images confirms the above findings. NON-VASCULAR Lower chest: No pleural or pericardial effusion. Visualized lung bases clear. Hepatobiliary: No focal liver  abnormality is seen. No gallstones, gallbladder wall thickening, or biliary dilatation. Pancreas: Unremarkable. No pancreatic ductal dilatation or surrounding inflammatory changes. Spleen: Normal in size without focal abnormality. 2 cm probable splenule abutting the pancreatic tail, stable since earliest available scan 06/18/2020. Adrenals/Urinary Tract: No adrenal mass. Marked bilateral renal parenchymal atrophy with extensive renal arterial branch calcifications. No hydronephrosis or mass. Urinary bladder is nondistended. Stomach/Bowel: Stomach is partially distended by gas and fluid, no acute findings. Small bowel is decompressed. Normal appendix. The colon is incompletely distended, with scattered diverticula at the descending/sigmoid junction, no adjacent inflammatory changes. Lymphatic: No abdominal or pelvic adenopathy. Reproductive: Mild prostate enlargement. Other: Subcutaneous gas bubbles overlying left common femoral vessels. There is a deep subcutaneous hematoma overlying the left common femoral vessels with extension into the anterior  extraperitoneal space of the pelvis, dominant component measuring 11.6 x 6.3 cm,, and an 8.3 cm right-sided component extending along the pelvic sidewall. No active extravasation is identified. No ascites or hemoperitoneum. No free air. Left pelvic phleboliths. Musculoskeletal: No acute findings. IMPRESSION: 1. 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 2. New anterior pelvic extraperitoneal hematoma measuring up to 11.6 cm diameter. No active extravasation evident. 3. Long segment LEFT SFA occlusion. Critical Value/emergent results were discussed by telephone at the time of interpretation on 08/28/2022 at 7:35 pm with provider [facility] , who verbally acknowledged these results. 4. Residual/recurrent stenosis at the distal margin of the RIGHT external iliac stent. 5. Patent RIGHT fem-pop graft 6. Patent LEFT iliac stents. 7. 4 mm  penetrating atheromatous ulcer in the infrarenal abdominal aorta. 8. Colonic diverticulosis. 9. Aortic Atherosclerosis (ICD10-I70.0). Electronically Signed   By: Corlis Leak M.D.   On: 08/28/2022 19:40   HYBRID OR IMAGING ([facility] ONLY)  Result Date: 08/22/2022 There is no interpretation for this exam.  This order is for images obtained during a surgical procedure.  Please See "Surgeries" Tab for more information regarding the procedure.     Marcos Eke, NP [facility]  09/14/2022  10:22 AM

## 2022-09-14 NOTE — Progress Notes (Signed)
Patient is agreeable to colonoscopy. Scheduled for tomorrow 4/9 at 0900. Prep ordered and can use bentyl prn for abdominal cramping.

## 2022-09-14 NOTE — Progress Notes (Signed)
[facility] Progress Note  Subjective:  Seen in ICU, family in room Still having hematochezia, GI following Hb 8.1  Vitals:   09/14/22 0800 09/14/22 0820 09/14/22 0900 09/14/22 1000  BP: (!) 143/65  (!) 150/73 130/64  Pulse:    80  Resp: 16 17 12 16   Temp:      TempSrc:      SpO2:  99%  93%  Weight: 75.2 kg     Height:        Exam: Constitutional: lying in bed, alert  CV: normal rate, no edema Respiratory: bilateral chest rise, no iwob Gastrointestinal: soft, nonended Psych: mood and affect appropriate  Access: Right upper extremity AVG, no bruit or thrill  L fem wound covered w/ dressing   L foot a bit discolored    OP HD: TTS SW  4h   350/800  64.5kg  RFA AVG= clotted/ new TDC in place   Heparin none  - last OP HD 3/14, post wt 65.9kg - venofer 50mg  IV weekly - rocaltrol 0.25 mcg po tiw - phoslo 2 ac tid - no esa, last Hb 11.0  Summary: presented 08/21/22 w/ acute pain/ ischemic L foot. Hx of L EIA stenting on 3/04, and prior RLE bypass. Pt underwent endarterectomy of the left common femoral, profundofemoral, superficial femoral and external iliac vessels with bovine pericardial patch angioplasty and a left iliofemoral and profundofemoral thromboembolectomy on 3/15. Was in ICU until 3/18 for hypotension. DC'd home on 3/18. Pt then returned on 3/22 to ED w/ N/V and unable to feel his L foot. BP's low in ED and was found to have L fem pseudoaneurysm. Had emergent CRRT for high K+. Then taken to OR 3/23 for primary repair of the distal EIA w/ hematoma evacuation. Extubated 3/24. BCx's from  admit were + for MRSA, IV vanc was started. On 3/26 was off CRRT and off pressors. R arm AVG was clotted and went for declot on 3/27 per [facility] but graft re-clotted. Pt had regular HD on 3/28 then temp cath was removed for line holiday. IR placed new TDC on 4/01 and pt had regular HD later the same day. Otherwise as below.    Assessment/ Plan: Left common femoral artery  pseudoaneurysm - sp repair of L EIA on 3/23. Went back to OR 4/3 for L CIA to profunda bypass, L CIA pta and stenting, and muscle flap (sartorious to groin). Still high risk of limb loss.   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 ID has since signed off.  CVA: +stroke on MRI. Neuro signed off.  ESRD: TTS HD.  SP CRRT 3/23- 3/26. Had HD 3/28, then the temp cath was removed for line holiday. On 4/1 VVS placed TDC and is now back on TTS schedule. Next HD 4/09.  Volume - min edema LE's, 2L O2, soft BP's, doubt sig vol excess Anemia: Hb 8-10 range. Likely multifactorial.  No IV iron given bacteremia. ESA ordered as darbe 100 mcg sq weekly on Wed.  Transfuse prn.  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 Cont po vdra and phoslo 2 ac tid as binder.  Dialysis access: AVG clotted 3/27, went for declot by VVS and then re-clotted. Temp cath removed 3/28. VVS placed new RIJ TDC on 4/1. Using [facility].  Arita Miss, MD  [facility] 09/14/2022, 10:31 AM  Recent Labs  Lab 09/12/22  0255 09/12/22 0359 09/13/22 0304 09/13/22 1104 09/13/22 1606 09/13/22 1959 09/13/22 2326 09/14/22 0720  HGB  --    < >  --    < > 7.2*   < > 9.2* 8.1*  ALBUMIN 1.7*  --  1.8*  --   --   --   --   --   CALCIUM 7.8*   < > 7.8*  --  8.0*  --   --  8.3*  PHOS 7.6*  --  5.5*  --   --   --   --   --   CREATININE 5.48*   < > 3.68*  --  4.51*  --   --  5.23*  K 3.9   < > 3.9  --  3.9  --   --  4.4   < > = values in this interval not displayed.    No results for input(s): "IRON", "TIBC", "FERRITIN" in the last 168 hours. Inpatient medications:  (feeding supplement) PROSource Plus  30 mL Oral TID BM   acetaminophen  650 mg Oral Q6H   acetaminophen  650 mg Oral Once   aspirin EC  81 mg Oral Q0600   calcitRIOL  0.25 mcg Oral Q T,Th,Sa-HD   calcium acetate  1,334 mg Oral TID WC   Chlorhexidine Gluconate Cloth  6 each  Topical Q0600   darbepoetin (ARANESP) injection - DIALYSIS  100 mcg Subcutaneous Q Wed-1800   feeding supplement (NEPRO CARB STEADY)  237 mL Oral TID BM   HYDROmorphone   Intravenous Q4H   [START ON 09/17/2022] leptospermum manuka honey  1 Application Topical Daily   liver oil-zinc oxide   Topical TID   methocarbamol  500 mg Oral TID   multivitamin  1 tablet Oral QHS   pantoprazole  40 mg Oral Daily   peg 3350 powder  0.5 kit Oral Once   polyethylene glycol  17 g Oral BID   rosuvastatin  10 mg Oral Daily   sodium chloride flush  10-40 mL Intracatheter Q12H   sodium hypochlorite   Irrigation BID    sodium chloride Stopped (09/08/22 0904)   sodium chloride 10 mL/hr at 09/12/22 1600   sodium chloride     magnesium sulfate bolus IVPB     Place/Maintain arterial line **AND** sodium chloride, sodium chloride, alteplase, diphenhydrAMINE **OR** diphenhydrAMINE, docusate sodium, heparin, hydrALAZINE, magnesium sulfate bolus IVPB, naloxone **AND** sodium chloride flush, ondansetron (ZOFRAN) IV, ondansetron (ZOFRAN) IV, mouth rinse, oxyCODONE, pentafluoroprop-tetrafluoroeth, sodium chloride flush

## 2022-09-14 NOTE — Progress Notes (Signed)
Pharmacy Antibiotic Note  Albert Harris is a 67 y.o. 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 Now with MRSA bacteremia. Noted patient is ESRD HD TTS PTA, with a right arm AV graft and a catheter in his right IJ. Pharmacy consulted to dose daptomycin due to enterococcus faecium and staph aureus growing from tissue cultures from 4/3.  Planning to keep on TTS schedule - will schedule Daptomycin accordingly   Plan: - Start Daptomycin 600mg  (8 mg/kg) IV post HD on Tues/Thurs and 750 mg (10 m/gk) IV post HD on Saturdays - Will continue to follow HD schedule/duration, culture results, LOT, and antibiotic de-escalation plans   Height: 5\' 8"  (172.7 cm) Weight: 75.2 kg (165 lb 12.6 oz) IBW/kg (Calculated) : 68.4  Temp (24hrs), Avg:97.9 F (36.6 C), Min:97.6 F (36.4 C), Max:98.2 F (36.8 C)  Recent Labs  Lab 09/12/22 0225 09/12/22 0255 09/12/22 1528 09/13/22 0303 09/13/22 0304 09/13/22 1606 09/14/22 0720  WBC 15.7*  --  17.4* 19.3*  --  16.9* 20.1*  CREATININE  --    < > 2.85* 3.71* 3.68* 4.51* 5.23*   < > = values in this interval not displayed.     Estimated Creatinine Clearance: 13.1 mL/min (A) (by C-G formula based on SCr of 5.23 mg/dL (H)).    Allergies  Allergen Reactions   Benadryl [Diphenhydramine] Rash   Lidocaine-Prilocaine Rash    Caused red marks up and down arm   Antimicrobials this admission:  Cefazolin 3/23 x 1 Vancomycin 3/24 >> 4/5 Zosyn 4/3 >>4/3 Daptomycin 4/5>>  Microbiology results:  4/3 L groin wound: staph aureus, enterococcus faecium sensitivity pending 4/3 MRSA PCR: neg 3/27 Bcx: neg 3/25 Bcx: MRSA 3/23 Bcx: MRSA   Thank you for allowing pharmacy to be a part of this patient's care.  Georgina Pillion, PharmD, BCPS Infectious Diseases Clinical Pharmacist 09/14/2022 4:21 PM   **Pharmacist phone directory can now be found on [email or web address] ([email or web address]).  Listed under [facility].

## 2022-09-14 NOTE — H&P (View-Only) (Signed)
  Progress Note  Primary GI: Unassigned  LOS: 17 days   Chief Complaint: Suspected diverticular bleed   Subjective  Patient states he would still like to avoid colonoscopy if possible since he feels he cannot tolerate prep due to abdominal pain.  Continuing to have rectal bleeding.  Denies nausea/vomiting.  Tolerating clear liquids without difficulty.  Patient with family at bedside, daughter. Provided some of the history.    Objective   Vital signs in last 24 hours: Temp:  [97.8 F (36.6 C)-98.3 F (36.8 C)] 98.1 F (36.7 C) (04/07 1809) Pulse Rate:  [63-91] 77 (04/08 0700) Resp:  [10-20] 12 (04/08 0900) BP: (111-159)/(54-93) 150/73 (04/08 0900) SpO2:  [92 %-100 %] 99 % (04/08 0820) FiO2 (%):  [21 %] 21 % (04/08 0820) Weight:  [75.2 kg] 75.2 kg (04/08 0800) Last BM Date : 09/14/22 Last BM recorded by nurses in past 5 days Stool Type: Type 7 (Liquid consistency with no solid pieces) (09/14/2022  8:00 AM)  General:   male in no acute distress  Heart:  Regular rate and rhythm; no murmurs Pulm: Clear anteriorly; no wheezing Abdomen: soft, nondistended, normal bowel sounds in all quadrants.  Minimal discomfort to LUQ. No organomegaly appreciated. Extremities:  No edema GU: RN present as chaperone, roughly 350 cc dark red blood in bed per rectum Neurologic:  Alert and  oriented x4;  No focal deficits.  Psych:  Cooperative. Normal mood and affect.  Intake/Output from previous day: 04/07 0701 - 04/08 0700 In: 795 [P.O.:480; Blood:315] Out: 50 [Drains:50] Intake/Output this shift: Total I/O In: 240 [P.O.:240] Out: -   Studies/Results: DG Chest Port 1 View  Result Date: 09/13/2022 CLINICAL DATA:  Fluid overload. EXAM: PORTABLE CHEST 1 VIEW COMPARISON:  08/28/2022 FINDINGS: Stable enlarged cardiac silhouette, mildly prominent upper lung zone pulmonary vasculature and mild chronic interstitial prominence. No Kerley lines, airspace consolidation or pleural fluid. Right jugular  catheter tip in the inferior aspect of the superior vena cava near the superior cavoatrial junction. No pneumothorax. Unremarkable bones. IMPRESSION: 1. No acute abnormality. 2. Stable cardiomegaly, mild pulmonary vascular congestion and mild chronic interstitial lung disease. Electronically Signed   By: Steven  Reid M.D.   On: 09/13/2022 13:02   CT ANGIO GI BLEED  Result Date: 09/13/2022 CLINICAL DATA:  GI bleed. EXAM: CTA ABDOMEN AND PELVIS WITHOUT AND WITH CONTRAST TECHNIQUE: Multidetector CT imaging of the abdomen and pelvis was performed using the standard protocol during bolus administration of intravenous contrast. Multiplanar reconstructed images and MIPs were obtained and reviewed to evaluate the vascular anatomy. RADIATION DOSE REDUCTION: This exam was performed according to the departmental dose-optimization program which includes automated exposure control, adjustment of the mA and/or kV according to patient size and/or use of iterative reconstruction technique. CONTRAST:  100mL OMNIPAQUE IOHEXOL 350 MG/ML SOLN COMPARISON:  09/09/2022 and 09/05/2022 as well as 08/12/2022 and 08/28/2022. FINDINGS: VASCULAR Aorta: Extensive stable 3-4 mm penetrating ulcer along the left posterolateral wall of the infrarenal segment. Atherosclerotic plaque throughout the abdominal aorta which is normal in caliber. Celiac: Calcified plaque at the origin and throughout. Short segment moderate stenosis near the origin. Otherwise patent. SMA: Significant calcified plaque at the origin calcified and noncalcified plaque proximally causing moderate short segment focal narrowing. SMA is otherwise patent distally. 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 Mild-to-moderate calcified plaque throughout both renal arteries. Right renal artery is patent. IMA: Calcified plaque proximally but otherwise patent. Inflow:   Right common and external iliac artery stent  present which is patent. Left common iliac artery stent patent. Left external iliac artery is patent. 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 Proximal Outflow: Prominent, but patent right common femoral artery with patent bifurcation. Surgical changes adjacent the common femoral artery. There is mild prominence of the left common femoral artery which is otherwise patent. Patent from the femoral artery and collaterals. Immediate occlusion of the left superficial femoral artery unchanged. Catheter extending anteriorly from the left inguinal region running adjacent the common femoral/external iliac vessels and then along the anterior border of the left iliopsoas muscle. Veins: No obvious venous abnormality within the limitations of this arterial phase study. Review of the MIP images confirms the above findings. NON-VASCULAR Lower chest: Mild stable cardiomegaly. Calcified plaque over the right coronary artery and descending thoracic aorta. Persistent nodular airspace process over the lung bases with largest nodule in the left base measuring 1.1 cm without significant change. Largest nodule over the right base measures 1.1 cm which is slightly smaller. A few other smaller subcentimeter nodules bilaterally unchanged. This nodule airspace process is new since 08/28/2022, but not significantly changed since 09/05/2022. Mild posterior bibasilar dependent atelectasis. No effusion. Hepatobiliary: Liver, gallbladder and biliary tree are normal. Pancreas: Stable homogeneous well-defined oval mass overlying the region of the pancreatic tail measuring approximately 2.4 cm. Spleen: Normal. Adrenals/Urinary Tract: Adrenal glands are normal. Kidneys slightly small with cortical thinning bilaterally. Decreased perfusion of the left kidney compared to the right. No hydronephrosis or focal mass. Bladder is somewhat contracted. Visualized ureters unremarkable. Stomach/Bowel: Stomach and small  bowel are normal. Moderate diverticulosis of the colon most notable over the distal descending and sigmoid colon. Appendix is normal. No definite evidence of GI bleed. Lymphatic: No adenopathy. Reproductive: Normal. Other: Stable retroperitoneal/prevesical space hematoma extending superiorly medially deep to the rectus abdominus muscles. This measures proximally 6 x 11.1 cm. Hematoma also extends along the left iliopsoas muscle without significant change. Musculoskeletal: No focal abnormality. IMPRESSION: VASCULAR: 1. No definite evidence of GI bleed. 2. 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 3. Atherosclerotic plaque throughout the abdominal aorta and branch vessels as described. Short segment moderate stenosis of the proximal celiac artery and SMA. 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 Decreased perfusion of the left kidney compared to the right. 4. Stable retroperitoneal/prevesical space hematoma extending superiorly medially deep to the rectus abdominus muscles with largest measurement 6 x 11.1 cm. Hematoma also extends along the left iliopsoas muscle without significant change. 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 5. 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 NONVASCULAR: 1. No acute findings in the abdomen or pelvis. 2. Persistent nodular airspace process over the lung bases with largest nodule in the left base measuring 1.1 cm without significant change since 09/05/2022. Largest nodule over the right base measures 1.1 cm which is slightly smaller. A few other smaller subcentimeter nodules bilaterally unchanged. Findings likely infectious/inflammatory in nature. Recommend follow-up CT 6 weeks. 3. 2.4 cm homogeneous well-defined oval mass overlying the region of the pancreatic tail without  significant change from 05/05/2021 possibly a splenule. Consider follow-up CT 1 year. 4. Colonic diverticulosis without evidence of acute diverticulitis. 5. Aortic atherosclerosis. Atherosclerotic coronary artery disease. 6. Stable cardiomegaly. Aortic Atherosclerosis (ICD10-I70.0). Electronically Signed   By: Daniel  Boyle M.D.     On: 09/13/2022 10:13    Lab Results: Recent Labs    09/13/22 0303 09/13/22 1104 09/13/22 1606 09/13/22 1959 09/13/22 2326 09/14/22 0720  WBC 19.3*  --  16.9*  --   --  20.1*  HGB 8.5*   < > 7.2* 8.3* 9.2* 8.1*  HCT 25.4*   < > 22.0* 25.0* 27.0* 25.4*  PLT 174  --  173  --   --  213   < > = values in this interval not displayed.   BMET Recent Labs    09/13/22 0304 09/13/22 1606 09/14/22 0720  NA 133* 134* 134*  K 3.9 3.9 4.4  CL 100 95* 96*  CO2 24 26 21*  GLUCOSE 86 94 93  BUN 34* 42* 47*  CREATININE 3.68* 4.51* 5.23*  CALCIUM 7.8* 8.0* 8.3*   LFT Recent Labs    09/13/22 0304  ALBUMIN 1.8*   PT/INR No results for input(s): "LABPROT", "INR" in the last 72 hours.   Scheduled Meds:  (feeding supplement) PROSource Plus  30 mL Oral TID BM   acetaminophen  650 mg Oral Q6H   acetaminophen  650 mg Oral Once   aspirin EC  81 mg Oral Q0600   calcitRIOL  0.25 mcg Oral Q T,Th,Sa-HD   calcium acetate  1,334 mg Oral TID WC   Chlorhexidine Gluconate Cloth  6 each Topical Q0600   darbepoetin (ARANESP) injection - DIALYSIS  100 mcg Subcutaneous Q Wed-1800   feeding supplement (NEPRO CARB STEADY)  237 mL Oral TID BM   HYDROmorphone   Intravenous Q4H   [START ON 09/17/2022] leptospermum manuka honey  1 Application Topical Daily   liver oil-zinc oxide   Topical TID   methocarbamol  500 mg Oral TID   multivitamin  1 tablet Oral QHS   pantoprazole  40 mg Oral Daily   peg 3350 powder  0.5 kit Oral Once   polyethylene glycol  17 g Oral BID   rosuvastatin  10 mg Oral Daily   sodium chloride flush  10-40 mL Intracatheter Q12H   sodium hypochlorite    Irrigation BID   Continuous Infusions:  sodium chloride Stopped (09/08/22 0904)   sodium chloride 10 mL/hr at 09/12/22 1600   sodium chloride     magnesium sulfate bolus IVPB        Patient profile:   66 y.o. male with a past medical history of hypertension, hyperlipidemia, peripheral vascular disease s/p right femoropopliteal bypass 08/2021, carotid artery stenosis, EVA, PFO, CHF with LVEF 50 - 55% per TEE 09/2022, ESRD on HD,  C.diff 06/2022, alcohol use disorder, lower GI bleed secondary to rectal solitary ulcer 2022. 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  Admitted for septic shock secondary to MRSA bacteremia.    Impression:  Rectal bleeding, likely secondary to diverticular bleed -Hgb 8.1 (9.2) -Flex sigmoidoscopy with Dr. Dorsey 09/12/2022: Stool in sigmoid colon, multiple ulcers in rectum, no specimens collected -CTA 4/7: No evidence of GI bleed, stable penetrating ulcer along left posterior lateral wall of infrarenal abdominal aorta, moderate stenosis of proximal celiac artery and SMA, retroperitoneal hematoma 6 x 11.1 cm.  Colonic diverticulosis without diverticulitis, 2.4 cm mass overlying pancreatic tail without significant change  ESRD on dialysis -BUN 47, creatinine 5.23, GFR 11  Septic shock  MRSA septicemia -Followed by ID and on IV antibiotics   Plan:   -Discussed with patient and daughter that colonoscopy is advised to   yield best results for evaluation.  Patient still hesitant at this time.  If he decides to go through with colonoscopy can give Bentyl to help with colon prep.  Tentatively plan colonoscopy for tomorrow - Continue daily CBC and transfuse above 7  Avari Gelles M Rodrigus Kilker  09/14/2022, 9:48 AM    

## 2022-09-14 NOTE — Consult Note (Addendum)
WOC Nurse wound follow up; patient seen 08/31/2022 by WOC team, sacrum DTI at that time, Wound type: 1. Former  Sacral/buttocks Deep Tissue Pressure Injury that has now evolved to Unstageable Pressure Injury   2.  Deep Tissue Pressure Injuries bilateral heels 3.  Moisture Associated Skin Damage to lower buttocks/perineal area  ICD-10 CM Codes for Irritant Dermatitis  L24A2 - Due to fecal, urinary or dual incontinence  Wound bed: 1.  Sacral Unstageable 9 cms x 11 cms 100% black yellow devitalized tissue  2.  Deep Tissue Pressure Injuries heels R heel 1 cm x 1 cm lateral maroon purple discoloration, skin intact; L heel 3 cms x 5 cms lateral purple maroon skin intact  3.  Buttocks and gluteal crease/upper thighs with scattered areas of partial thickness and full thickness skin loss, patient soiled with stool at time of this visit   Drainage (amount, consistency, odor) minimal tan from buttocks  Periwound: moist around sacrum/buttocks Dressing procedure/placement/frequency:  Clean sacral/buttocks wound with NS, apply Dakin's moistened gauze twice daily to wound bed x 3 days, cover with dry gauze and ABD pad or silicone foam whichever preferred.  After 3 days of Dakin's begin Medihoney 09/17/2022 apply to wound bed daily, cover with dry gauze and ABD pad or silicone foam.   Bilateral heels to be placed in Prevalon boots, boots previously ordered but were not on patient. Prevalon boots applied at this visit.  Moisture Associated Skin Damage Desitin 3 times daily and prn soiling.   Patient should remain on a low air loss mattress throughout hospitalization.    Discussed POC with patient, bedside nurse and primary MD.  Will ask PT to evaluate for hydrotherapy as well.    WOC team will not follow this patient at this time.  Re-consult if further needs arise.   Thank you,     Priscella Mann MSN, RN-BC, [employer] [phone]

## 2022-09-14 NOTE — Progress Notes (Signed)
Progress Note  Primary GI: Unassigned  LOS: 17 days   Chief Complaint: Suspected diverticular bleed   Subjective  Patient states he would still like to avoid colonoscopy if possible since he feels he cannot tolerate prep due to abdominal pain.  Continuing to have rectal bleeding.  Denies nausea/vomiting.  Tolerating clear liquids without difficulty.  Patient with family at bedside, daughter. Provided some of the history.    Objective   Vital signs in last 24 hours: Temp:  [97.8 F (36.6 C)-98.3 F (36.8 C)] 98.1 F (36.7 C) (04/07 1809) Pulse Rate:  [63-91] 77 (04/08 0700) Resp:  [10-20] 12 (04/08 0900) BP: (111-159)/(54-93) 150/73 (04/08 0900) SpO2:  [92 %-100 %] 99 % (04/08 0820) FiO2 (%):  [21 %] 21 % (04/08 0820) Weight:  [75.2 kg] 75.2 kg (04/08 0800) Last BM Date : 09/14/22 Last BM recorded by nurses in past 5 days Stool Type: Type 7 (Liquid consistency with no solid pieces) (09/14/2022  8:00 AM)  General:   male in no acute distress  Heart:  Regular rate and rhythm; no murmurs Pulm: Clear anteriorly; no wheezing Abdomen: soft, nondistended, normal bowel sounds in all quadrants.  Minimal discomfort to LUQ. No organomegaly appreciated. Extremities:  No edema GU: RN present as chaperone, roughly 350 cc dark red blood in bed per rectum Neurologic:  Alert and  oriented x4;  No focal deficits.  Psych:  Cooperative. Normal mood and affect.  Intake/Output from previous day: 04/07 0701 - 04/08 0700 In: 795 [P.O.:480; Blood:315] Out: 50 [Drains:50] Intake/Output this shift: Total I/O In: 240 [P.O.:240] Out: -   Studies/Results: DG Chest Port 1 View  Result Date: 09/13/2022 CLINICAL DATA:  Fluid overload. EXAM: PORTABLE CHEST 1 VIEW COMPARISON:  08/28/2022 FINDINGS: Stable enlarged cardiac silhouette, mildly prominent upper lung zone pulmonary vasculature and mild chronic interstitial prominence. No Kerley lines, airspace consolidation or pleural fluid. Right jugular  catheter tip in the inferior aspect of the superior vena cava near the superior cavoatrial junction. No pneumothorax. Unremarkable bones. IMPRESSION: 1. No acute abnormality. 2. Stable cardiomegaly, mild pulmonary vascular congestion and mild chronic interstitial lung disease. Electronically Signed   By: Beckie Salts M.D.   On: 09/13/2022 13:02   CT ANGIO GI BLEED  Result Date: 09/13/2022 CLINICAL DATA:  GI bleed. EXAM: CTA ABDOMEN AND PELVIS WITHOUT AND WITH CONTRAST TECHNIQUE: Multidetector CT imaging of the abdomen and pelvis was performed using the standard protocol during bolus administration of intravenous contrast. Multiplanar reconstructed images and MIPs were obtained and reviewed to evaluate the vascular anatomy. RADIATION DOSE REDUCTION: This exam was performed according to the departmental dose-optimization program which includes automated exposure control, adjustment of the mA and/or kV according to patient size and/or use of iterative reconstruction technique. CONTRAST:  OMNIPAQUE IOHEXOL 350 MG/ML SOLN COMPARISON:  09/09/2022 and 09/05/2022 as well as 08/12/2022 and 08/28/2022. FINDINGS: VASCULAR Aorta: Extensive stable 3-4 mm penetrating ulcer along the left posterolateral wall of the infrarenal segment. Atherosclerotic plaque throughout the abdominal aorta which is normal in caliber. Celiac: Calcified plaque at the origin and throughout. Short segment moderate stenosis near the origin. Otherwise patent. SMA: Significant calcified plaque at the origin calcified and noncalcified plaque proximally causing moderate short segment focal narrowing. SMA is otherwise patent distally. 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 Mild-to-moderate calcified plaque throughout both renal arteries. Right renal artery is patent. IMA: Calcified plaque proximally but otherwise patent. Inflow:  Right common and external iliac artery stent  present which is patent. Left common iliac artery stent patent. Left external iliac artery is patent. 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 Proximal Outflow: Prominent, but patent right common femoral artery with patent bifurcation. Surgical changes adjacent the common femoral artery. There is mild prominence of the left common femoral artery which is otherwise patent. Patent from the femoral artery and collaterals. Immediate occlusion of the left superficial femoral artery unchanged. Catheter extending anteriorly from the left inguinal region running adjacent the common femoral/external iliac vessels and then along the anterior border of the left iliopsoas muscle. Veins: No obvious venous abnormality within the limitations of this arterial phase study. Review of the MIP images confirms the above findings. NON-VASCULAR Lower chest: Mild stable cardiomegaly. Calcified plaque over the right coronary artery and descending thoracic aorta. Persistent nodular airspace process over the lung bases with largest nodule in the left base measuring 1.1 cm without significant change. Largest nodule over the right base measures 1.1 cm which is slightly smaller. A few other smaller subcentimeter nodules bilaterally unchanged. This nodule airspace process is new since 08/28/2022, but not significantly changed since 09/05/2022. Mild posterior bibasilar dependent atelectasis. No effusion. Hepatobiliary: Liver, gallbladder and biliary tree are normal. Pancreas: Stable homogeneous well-defined oval mass overlying the region of the pancreatic tail measuring approximately 2.4 cm. Spleen: Normal. Adrenals/Urinary Tract: Adrenal glands are normal. Kidneys slightly small with cortical thinning bilaterally. Decreased perfusion of the left kidney compared to the right. No hydronephrosis or focal mass. Bladder is somewhat contracted. Visualized ureters unremarkable. Stomach/Bowel: Stomach and small  bowel are normal. Moderate diverticulosis of the colon most notable over the distal descending and sigmoid colon. Appendix is normal. No definite evidence of GI bleed. Lymphatic: No adenopathy. Reproductive: Normal. Other: Stable retroperitoneal/prevesical space hematoma extending superiorly medially deep to the rectus abdominus muscles. This measures proximally 6 x 11.1 cm. Hematoma also extends along the left iliopsoas muscle without significant change. Musculoskeletal: No focal abnormality. IMPRESSION: VASCULAR: 1. No definite evidence of GI bleed. 2. 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 3. Atherosclerotic plaque throughout the abdominal aorta and branch vessels as described. Short segment moderate stenosis of the proximal celiac artery and SMA. 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 Decreased perfusion of the left kidney compared to the right. 4. Stable retroperitoneal/prevesical space hematoma extending superiorly medially deep to the rectus abdominus muscles with largest measurement 6 x 11.1 cm. Hematoma also extends along the left iliopsoas muscle without significant change. 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 5. 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 NONVASCULAR: 1. No acute findings in the abdomen or pelvis. 2. Persistent nodular airspace process over the lung bases with largest nodule in the left base measuring 1.1 cm without significant change since 09/05/2022. Largest nodule over the right base measures 1.1 cm which is slightly smaller. A few other smaller subcentimeter nodules bilaterally unchanged. Findings likely infectious/inflammatory in nature. Recommend follow-up CT 6 weeks. 3. 2.4 cm homogeneous well-defined oval mass overlying the region of the pancreatic tail without  significant change from 05/05/2021 possibly a splenule. Consider follow-up CT 1 year. 4. Colonic diverticulosis without evidence of acute diverticulitis. 5. Aortic atherosclerosis. Atherosclerotic coronary artery disease. 6. Stable cardiomegaly. Aortic Atherosclerosis (ICD10-I70.0). Electronically Signed   By: Elberta Fortisaniel  Boyle M.D.  On: 09/13/2022 10:13    Lab Results: Recent Labs    09/13/22 0303 09/13/22 1104 09/13/22 1606 09/13/22 1959 09/13/22 2326 09/14/22 0720  WBC 19.3*  --  16.9*  --   --  20.1*  HGB 8.5*   < > 7.2* 8.3* 9.2* 8.1*  HCT 25.4*   < > 22.0* 25.0* 27.0* 25.4*  PLT 174  --  173  --   --  213   < > = values in this interval not displayed.   BMET Recent Labs    09/13/22 0304 09/13/22 1606 09/14/22 0720  NA 133* 134* 134*  K 3.9 3.9 4.4  CL 100 95* 96*  CO2 24 26 21*  GLUCOSE 86 94 93  BUN 34* 42* 47*  CREATININE 3.68* 4.51* 5.23*  CALCIUM 7.8* 8.0* 8.3*   LFT Recent Labs    09/13/22 0304  ALBUMIN 1.8*   PT/INR No results for input(s): "LABPROT", "INR" in the last 72 hours.   Scheduled Meds:  (feeding supplement) PROSource Plus  30 mL Oral TID BM   acetaminophen  650 mg Oral Q6H   acetaminophen  650 mg Oral Once   aspirin EC  81 mg Oral Q0600   calcitRIOL  0.25 mcg Oral Q T,Th,Sa-HD   calcium acetate  1,334 mg Oral TID WC   Chlorhexidine Gluconate Cloth  6 each Topical Q0600   darbepoetin (ARANESP) injection - DIALYSIS  100 mcg Subcutaneous Q Wed-1800   feeding supplement (NEPRO CARB STEADY)  237 mL Oral TID BM   HYDROmorphone   Intravenous Q4H   [START ON 09/17/2022] leptospermum manuka honey  1 Application Topical Daily   liver oil-zinc oxide   Topical TID   methocarbamol  500 mg Oral TID   multivitamin  1 tablet Oral QHS   pantoprazole  40 mg Oral Daily   peg 3350 powder  0.5 kit Oral Once   polyethylene glycol  17 g Oral BID   rosuvastatin  10 mg Oral Daily   sodium chloride flush  10-40 mL Intracatheter Q12H   sodium hypochlorite    Irrigation BID   Continuous Infusions:  sodium chloride Stopped (09/08/22 0904)   sodium chloride 10 mL/hr at 09/12/22 1600   sodium chloride     magnesium sulfate bolus IVPB        Patient profile:   67 y.o. male with a past medical history of hypertension, hyperlipidemia, peripheral vascular disease s/p right femoropopliteal bypass 08/2021, carotid artery stenosis, EVA, PFO, CHF with LVEF 50 - 55% per TEE 09/2022, ESRD on HD,  C.diff 06/2022, alcohol use disorder, lower GI bleed secondary to rectal solitary ulcer 2022. 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  Admitted for septic shock secondary to MRSA bacteremia.    Impression:  Rectal bleeding, likely secondary to diverticular bleed -Hgb 8.1 (9.2) -Flex sigmoidoscopy with Dr. Leonides Schanz 09/12/2022: Stool in sigmoid colon, multiple ulcers in rectum, no specimens collected -CTA 4/7: No evidence of GI bleed, stable penetrating ulcer along left posterior lateral wall of infrarenal abdominal aorta, moderate stenosis of proximal celiac artery and SMA, retroperitoneal hematoma 6 x 11.1 cm.  Colonic diverticulosis without diverticulitis, 2.4 cm mass overlying pancreatic tail without significant change  ESRD on dialysis -BUN 47, creatinine 5.23, GFR 11  Septic shock  MRSA septicemia -Followed by ID and on IV antibiotics   Plan:   -Discussed with patient and daughter that colonoscopy is advised to  yield best results for evaluation.  Patient still hesitant at this time.  If he decides to go through with colonoscopy can give Bentyl to help with colon prep.  Tentatively plan colonoscopy for tomorrow - Continue daily CBC and transfuse above 7  Juliauna Stueve M Kavari Parrillo  09/14/2022, 9:48 AM

## 2022-09-14 NOTE — Progress Notes (Signed)
Physical Therapy Wound Treatment Patient Details  Name: Albert Harris MRN: [id number] Date of Birth: [birth year]  Today's Date: 09/14/2022 Time: 4982-6415 Time Calculation (min): 57 min  Subjective  Subjective Assessment Subjective: Pt pleasant and agreeable to wound therapy (HOH) Patient and Family Stated Goals: Heal wound Date of Onset:  (Unknown) Prior Treatments: Dressing changes  Pain Score:  Pt did not complain of pain throughout session. PCA pump available throughout session.   Wound Assessment  Pressure Injury 08/28/22 Sacrum Medial Unstageable - Full thickness tissue loss in which the base of the injury is covered by slough (yellow, tan, gray, green or brown) and/or eschar (tan, brown or black) in the wound bed. Sacral wound with eschar coveri (Active)  Wound Image   09/14/22 1344  Dressing Type Foam - Lift dressing to assess site every shift;Gauze (Comment);Dakin's-soaked gauze;Moist to moist;Barrier Film (skin prep) 09/14/22 1344  Dressing Clean, Dry, Intact;Changed 09/14/22 1344  Dressing Change Frequency Twice a day 09/14/22 1344  State of Healing Eschar 09/14/22 1344  Site / Wound Assessment Black;Brown;Yellow 09/14/22 1344  % Wound base Red or Granulating 10% 09/14/22 1344  % Wound base Yellow/Fibrinous Exudate 5% 09/14/22 1344  % Wound base Black/Eschar 85% 09/14/22 1344  % Wound base Other/Granulation Tissue (Comment) 0% 09/14/22 1344  Peri-wound Assessment Purple 09/14/22 1344  Wound Length (cm) 10.5 cm 09/14/22 1344  Wound Width (cm) 11.3 cm 09/14/22 1344  Wound Depth (cm) 0.1 cm 09/14/22 1344  Wound Surface Area (cm^2) 118.65 cm^2 09/14/22 1344  Wound Volume (cm^3) 11.87 cm^3 09/14/22 1344  Tunneling (cm) 0 09/14/22 1344  Undermining (cm) 0 09/14/22 1344  Margins Unattached edges (unapproximated) 09/14/22 1344  Drainage Amount Minimal 09/14/22 1344  Drainage Description Purulent 09/14/22 1344  Treatment Debridement (Selective);Irrigation;Other (Comment)  09/14/22 1344      Selective Debridement (non-excisional) Selective Debridement (non-excisional) - Location: Sacrum Selective Debridement (non-excisional) - Tools Used: Forceps, Scalpel Selective Debridement (non-excisional) - Tissue Removed: Eschar    Wound Assessment and Plan  Wound Therapy - Assess/Plan/Recommendations Wound Therapy - Clinical Statement: This patient presents to wound therapy with an unstagable pressure injury at the sacrum. Debridement initiated and pt tolerated without reports of pain. This patient will benefit from continued wound therapy for selective removal of unviable tissue, to decrease bioburden, and promote wound bed healing. Wound Therapy - Functional Problem List: Decreased tolerance for repositioning and OOB due to wound; global weakness in the setting of extended hospitalization and now ICU stay. Factors Delaying/Impairing Wound Healing: Immobility, Multiple medical problems, Polypharmacy Hydrotherapy Plan: Debridement, Dressing change, Patient/family education Wound Therapy - Frequency: 2X / week Wound Therapy - Follow Up Recommendations: dressing changes by RN  Wound Therapy Goals- Improve the function of patient's integumentary system by progressing the wound(s) through the phases of wound healing (inflammation - proliferation - remodeling) by: Wound Therapy Goals - Improve the function of patient's integumentary system by progressing the wound(s) through the phases of wound healing by: Decrease Necrotic Tissue to: 20% Decrease Necrotic Tissue - Progress: Goal set today Increase Granulation Tissue to: 80% Increase Granulation Tissue - Progress: Goal set today Goals/treatment plan/discharge plan were made with and agreed upon by patient/family: Yes Time For Goal Achievement: 7 days Wound Therapy - Potential for Goals: Good  Goals will be updated until maximal potential achieved or discharge criteria met.  Discharge criteria: when goals achieved,  discharge from hospital, MD decision/surgical intervention, no progress towards goals, refusal/missing three consecutive treatments without notification or medical reason.  GP  Charges PT Wound Care Charges $Wound Debridement up to 20 cm: < or equal to 20 cm $ Wound Debridement each add'l 20 sqcm: 5 $PT Hydrotherapy Visit: 1 Visit       [name] 09/14/2022, 2:42 PM  Conni Slipper, PT, DPT Acute Rehabilitation Services Secure Chat Preferred Office: [phone]

## 2022-09-14 NOTE — Progress Notes (Signed)
Physical Therapy Treatment/ Patient Details Name: Albert Harris MRN: [id number] DOB: 1955-09-04 Today's Date: 09/14/2022   History of Present Illness 67 year old male  ER with abdominal pain, nausea, diaphoresis and unable to feel his Lt foot. Hypotensive with lactic acidosis in ER.  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 CRRT 3/22-3/26.  3/22 repair of distal Lt external iliac artery. On 3/30, Hgb drop from 9 to 7 with worsening abdominal pain, CT abd 3/30 shows slightly larger anterior pelvic extraperitoneal hematoma. PMHx: PAD, ESRD HD, recently hospitalized 3/16-18 for acute on chronic left lower extremity ischemia.    PT Comments    09/14/2022   Tilt Bed Documentation   Time Tilt Angle BP HR Total Mins Tilted: Pt response*:  10:00 0 130/64 86    1042 22 131/114 83    1044 22 77/59 85 2 Flat, drowsy, mild dizziness   1048 22 79/52 88 6 Flat drowsy, no dizziness  1053 22 83/54 87 11   1056 29 76/40 87 0   1100 29 91/76 86 4 Flat, drowsy, mild dizziness   1107 0 102/45 82         Recommendations for follow up therapy are one component of a multi-disciplinary discharge planning process, led by the attending physician.  Recommendations may be updated based on patient status, additional functional criteria and insurance authorization.  Follow Up Recommendations  Can patient physically be transported by private vehicle: No    Assistance Recommended at Discharge Frequent or constant Supervision/Assistance  Patient can return home with the following Assist for transportation;Two people to help with walking and/or transfers;Two people to help with bathing/dressing/bathroom;Assistance with cooking/housework;Help with stairs or ramp for entrance   Equipment Recommendations  Other (comment) (tbd)       Precautions / Restrictions Precautions Precautions: Fall Precaution Comments: L groin wound vac, drain Restrictions Weight Bearing Restrictions: No LLE  Weight Bearing: Weight bearing as tolerated (wound vac to L groin, LLE weakness) Other Position/Activity Restrictions: decreased L hip flexion     Mobility  Bed Mobility Overal bed mobility: Needs Assistance Bed Mobility: Rolling Rolling: Total assist         General bed mobility comments: total A for rolling R and L for cleaning due to copious amounts of bloody drainage from his rectum, utilized tilt bed for mobility Angle: 22 degrees Total Minutes in Angle: 11 minutes Patient Response: Flat affect  Transfers                                 Cognition Arousal/Alertness: Awake/alert Behavior During Therapy: Flat affect Overall Cognitive Status: Impaired/Different from baseline Area of Impairment: Following commands, Safety/judgement, Problem solving                   Current Attention Level: Divided Memory: Decreased short-term memory Following Commands: Follows one step commands consistently, Follows one step commands with increased time     Problem Solving: Decreased initiation, Requires verbal cues, Difficulty sequencing, Slow processing General Comments: pt very flat, not opening eyes often during session, answers questions when asked though        Exercises General Exercises - Lower Extremity Ankle Circles/Pumps: Supine, AAROM, Both, 10 reps (no active DF LLE) Heel Slides: Right, 5 reps, Supine, AAROM    General Comments General comments (skin integrity, edema, etc.): see vitals above, pt very cold and difficult to get pulse ox sensor to read, in  90s when good waveform present      Pertinent Vitals/Pain Pain Assessment Pain Assessment: Faces Faces Pain Scale: Hurts even more Pain Location: L groin and flank with rolling Pain Descriptors / Indicators: Grimacing, Guarding Pain Intervention(s): Limited activity within patient's tolerance, Monitored during session, Repositioned, PCA encouraged     PT Goals (current goals can now be found  in the care plan section) Acute Rehab PT Goals Patient Stated Goal: Pt wants to get better and go home. PT Goal Formulation: With patient/family Time For Goal Achievement: 09/14/22 Potential to Achieve Goals: Good Progress towards PT goals: Progressing toward goals    Frequency    Min 1X/week      PT Plan Current plan remains appropriate    Co-evaluation PT/OT/SLP Co-Evaluation/Treatment: Yes Reason for Co-Treatment: Complexity of the patient's impairments (multi-system involvement);For patient/therapist safety;To address functional/ADL transfers PT goals addressed during session: Mobility/safety with mobility;Balance OT goals addressed during session: ADL's and self-care;Proper use of Adaptive equipment and DME      AM-PAC PT "6 Clicks" Mobility   Outcome Measure  Help needed turning from your back to your side while in a flat bed without using bedrails?: Total Help needed moving from lying on your back to sitting on the side of a flat bed without using bedrails?: Total Help needed moving to and from a bed to a chair (including a wheelchair)?: Total Help needed standing up from a chair using your arms (e.g., wheelchair or bedside chair)?: Total Help needed to walk in hospital room?: Total Help needed climbing 3-5 steps with a railing? : Total 6 Click Score: 6    End of Session   Activity Tolerance: Patient limited by lethargy;Other (comment) (decreased BP with tilting) Patient left: in bed;with call bell/phone within reach;with family/visitor present;with bed alarm set Nurse Communication: Mobility status PT Visit Diagnosis: Other abnormalities of gait and mobility (R26.89);Muscle weakness (generalized) (M62.81);Difficulty in walking, not elsewhere classified (R26.2)     Time: 8185-6314 PT Time Calculation (min) (ACUTE ONLY): 62 min  Charges:  $Therapeutic Exercise: 23-37 mins $Therapeutic Activity: 23-37 mins                     Sheronda Parran B. Beverely Risen PT,  DPT Acute Rehabilitation Services Please use secure chat or  Call Office [phone]    Elon Alas [facility] 09/14/2022, 12:37 PM

## 2022-09-14 NOTE — Progress Notes (Addendum)
Progress Note    09/14/2022 7:35 AM 2 Days Post-Op  Subjective:  says he is okay. Per RN he had a rough night   Vitals:   09/14/22 0630 09/14/22 0700  BP:  (!) 111/59  Pulse: 88 77  Resp: 18 14  Temp:    SpO2:  100%   Physical Exam: Cardiac:  regular Lungs:  non labored, 2L [place] Incisions:  left groin with VAC with good seal, there was a little oozing from lateral aspect of dressing Extremities:  Left leg very faint PT signal. Left foot cool. Right foot DP, warm Abdomen:  soft, non distended Neurologic: alert and oriented  CBC    Component Value Date/Time   WBC 16.9 (H) 09/13/2022 1606   RBC 2.33 (L) 09/13/2022 1606   HGB 9.2 (L) 09/13/2022 2326   HGB 10.4 (L) 06/18/2021 1431   HCT 27.0 (L) 09/13/2022 2326   HCT 31.6 (L) 06/18/2021 1431   PLT 173 09/13/2022 1606   PLT 142 (L) 06/18/2021 1431   MCV 94.4 09/13/2022 1606   MCV 96 06/18/2021 1431   MCH 30.9 09/13/2022 1606   MCHC 32.7 09/13/2022 1606   RDW 17.8 (H) 09/13/2022 1606   RDW 14.3 06/18/2021 1431   LYMPHSABS 1.5 08/28/2022 1810   LYMPHSABS 1.5 06/18/2021 1431   MONOABS 0.7 08/28/2022 1810   EOSABS 0.1 08/28/2022 1810   EOSABS 1.0 (H) 06/18/2021 1431   BASOSABS 0.1 08/28/2022 1810   BASOSABS 0.1 06/18/2021 1431    BMET    Component Value Date/Time   NA 134 (L) 09/13/2022 1606   NA 141 06/18/2021 1431   K 3.9 09/13/2022 1606   CL 95 (L) 09/13/2022 1606   CO2 26 09/13/2022 1606   GLUCOSE 94 09/13/2022 1606   BUN 42 (H) 09/13/2022 1606   BUN 20 06/18/2021 1431   CREATININE 4.51 (H) 09/13/2022 1606   CREATININE 1.15 02/19/2017 1049   CALCIUM 8.0 (L) 09/13/2022 1606   CALCIUM 8.5 (L) 06/10/2020 0900   GFRNONAA 13 (L) 09/13/2022 1606   GFRAA 6 (L) 03/04/2020 1045    INR    Component Value Date/Time   INR 1.2 08/12/2022 1501     Intake/Output Summary (Last 24 hours) at 09/14/2022 0735 Last data filed at 09/14/2022 0600 Gross per 24 hour  Intake 795 ml  Output 50 ml  Net 745 ml      Assessment/Plan:  67 y.o. male is s/p iliofemoral bypass, removal of infected patch with sartorius muscle flap 2 Days Post-Op   Left groin with VAC to suction. Good seal. No further output overnight 20cc output from JP. May have this d/c later today vs tomorrow LLE perfusion unchanged/ Faint PT signal Last hgb 9.2 post transfusion. Morning labs pending Continues to have bloody bowel movements. GI was planning colonoscopy however pt refused bowel prep so postponed Cx grew MRSA. Remains on IV Cubicin VAC  change per Dr. Veatrice Bourbon Will likely need left AKA at some point   Graceann Congress, PA-C [facility] [phone] 09/14/2022 7:35 AM  VASCULAR STAFF ADDENDUM: I have independently interviewed and examined the patient. I agree with the above.   VAC off. Changed bedside, black sponge - will change Monday - Thursdays. - Discussed with [facility], no exposed graft/ bypass.  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   Peroneal signal in the foot. Will  not be enough for healing. Pt not a revascularization candidate. He is aware.  Likely limb loss down the road.   Appreciate critical care and gastroenterology.  Plan for scope and dialysis tomorrow.  Continue abx     Fara Olden, MD [facility] Phone Number: [phone] 09/14/2022 10:22 AM

## 2022-09-14 NOTE — Progress Notes (Addendum)
PROGRESS NOTE Albert Harris  ZOX:[id number] DOB: 1955-07-06 DOA: 08/28/2022 PCP: [name], NP  Brief Narrative/Hospital Course: 67 year old male with history of ESRD on HD TTS, PVD,COPD, essential hypertension, chronic systolic congestive heart failure with ejection fraction 35 to 40%, presented with abdominal pain nausea and diaphoresis.Patient was also hypotensive with a lactic acidosis and was admitted to ICU,with a pulseless left lower extremity was found to have femoral pseudoaneurysm.Culture came back with MRSA bacteremia-TEE neg for .   Events: 08/29/22: -Admitted to ICU-on vasopressors for septic shock -s/p OR with Dr. Karin Lieu for left femoral exploration with pseudoaneurysm repair,hematoma evacuation>showed dehiscence at proximal aspect of bovine pericardial patch at the suture line.  -Emergent CRRT for severe hyperkalemia  09/08/22 Transferred to [facility] service  09/09/22 CTA for bleeding on left groin: showed Thrombosis of LEFT CFA pseudoaneurysm with new 3.5 cm filling defect, consistent with arterial thrombus, extending from the PSA neck and into the distal CFA. This is at risk for distal embolization and potential acute LEFT lower extremity ischemia.New 8 mm distal LEFT EIA pseudoaneurysm, medial to the stent distal landing zone. Mild interval enlargement of retroperitoneal hematoma, in a craniocaudal dimension measuring 12.2 cm (previously 11.2 cm).No CTA evidence of active extravasation. Patent RIGHT femoral-to-popliteal graft and bilateral iliac stents.Severe burden of splanchnic arterial atherosclerosis, at risk for chronic mesenteric ischemia. 09/09/22: urgent OR:S/P left common iliac to profunda femoris bypass with ipsilateral nonreversed greater saphenous vein in anatomic tunnel, left common iliac artery angioplasty and stenting, left greater saphenous vein harvest, left sartorius muscle of left groin, left lower extremity angiography with first-order cannulation Transferred to 2H  post op 09/12/22 am: 2 BRBPR> s/p Flex sig>showed 5 rectal ulcers, not actively bleeding- stool brown.advised to avoid constipation, added Miralax BID 09/13/22 AM: again 2 large bloody BM ~ 500cc> got 1 unit PRBC   Subjective: Seen and examined He is being changed> having bleeding per rectum Overnight BP stable afebrile Refusing to drink colon prep> procedure postponed> he agreed to drink tonight Hemoglobin has stabilized up to 9.2 g this am  Assessment and Plan:  Septic shock with MRSA bacteremia-Initially admitted to ICU on vasopressors>shock resolved Left Groin wound infection w/ MRSA left Groin hematoma with a left common femoral artery pseudoaneurysm Left groin bleeding 4/3 AM and CTA showed arterial thrombus, new distal left EIA pseudoaneurysm (see report for more) S/P Urgent OR 09/09/22 and underwent bypass and stenting->intraoperative culture with MRSA: S/p OR 4/3 w/  left common iliac to profunda femoris bypass/ Lt common iliac artery angioplasty and stenting>OR culture 4/3 MRSA and E Fecium> ID following vancomycin changed to daptomycin.peroneal signal in the foot, will not be enough for healing, not a revascularization candidate and patient aware likely limb loss down the road VVS following, cont wound VAC. On aspirin/statin  Bright red bleeding per rectum: Episodes during 4/5 night 4/7 AM and again 4/8 am>/p Flex sig 4/5 >showed 5 rectal ulcers, not actively bleeding- stool was brown> gi advised to avoid constipation.S/p CT angio 4/7 for BRBPR:no definite evidence of GI bleed, stable 3 to 4 mm penetrating ulcer of the infrarenal abdominal aorta.GI following planning for colonoscopy but unable to complete prep> agreed to drink tonight, ongoing rectal bleeding will trend H&H and transfuse.  PVD with history of left external iliac artery and common artery stenting and right femoropopliteal bypass with left common femoral artery pseudoaneurysm on CT 3/22, status post left femoral artery  pseudoaneurysm repair 3/22, S/P AVG thrombectomy attempted but patient became hemodynamically unstable and procedure  was aborted, followed by vascular  s/p OR with surgery as above on 09/09/22.see above.  Retroperitoneal hemorrhage on CTA 3/22. CTA angio 09/13/22 stable retroperitoneal hematoma.  Acute respiratory failure with hypoxemia.   COPD: Cont supplemental oxygen, IS, prn inhalers.  Chronic CHF with preserved EF-based on echo from 4/1 Pulmonary hypertension: TEE 4/1 showed EF 50-55%,severe LVH, no left atrial thrombus detected.adjust fluids w/ HDNet IO Since Admission: 8,609.88 mL [09/14/22 1032]   ESRD on HD UEA:[id number] following cont HD as per schedule. AVG clotted on 3/27:s/p temp catheter removed 3/28 IR placed new TDC on 4/1.  Hyponatremia/hypokalemia/hyperkalemia/ metabolic acidosis:Electrolytes are stable continue to monitor  Recent Labs  Lab 09/09/22 0411 09/09/22 1209 09/10/22 0627 09/10/22 2024 09/11/22 0441 09/11/22 1545 09/12/22 0255 09/12/22 1528 09/13/22 0303 09/13/22 0304 09/13/22 1606 09/14/22 0720  K 4.2   < > 6.0*   < > 3.6   < > 3.9 3.7 3.9 3.9 3.9 4.4  CALCIUM 8.3*   < > 7.8*   < > 7.8*   < > 7.8* 7.7* 7.7* 7.8* 8.0* 8.3*  MG  --   --   --   --   --   --   --   --   --   --   --  2.1  PHOS 6.3*  --  9.0*  --  5.0*  --  7.6*  --   --  5.5*  --   --    < > = values in this interval not displayed.    Acute blood loss anemia Anemia of chronic kidney disease: Acute blood loss anemia multifactorial  from operative loss(600 cc of blood loss during surgery), retroperitoneal bleed pseudoaneurysm/left groin bleeding/with wound VAC w/ drain in place and rectal bleedING.  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  Now on aspirin 81> held 4/6 for rectal bleeding.  Neurology signed off monitor. Cont statins.  HLD: on crestor  History of hypertension with transient hypotension on 3/30 was managed with IV fluid bolus,not needing antihypertensives now-monitor.   Hypoglycemia up to 61 earlier this morning improved on 114 check serially Recent Labs  Lab 09/13/22 1104 09/13/22 1616 09/14/22 0825 09/14/22 0910 09/14/22 1151  GLUCAP 102* 95 61* 64* 114*     Lung nodule largest 1.1 cm without significant change since 3/30 finding likely infectious/inflammatory, recommended CT in 6 weeks 2.4 cm well-defined pancreatic tail mass unchanged since 05/05/2021 needs CT scan in 1 year  Poor oral intake: Dietitian following Moderate protein calorie malnutrition augment diet as tolerated  Nutrition Problem: Moderate Malnutrition (in the context of chronic illness (ESRD)) Etiology: poor appetite Signs/Symptoms: moderate fat depletion, severe muscle depletion Interventions: Refer to RD note for recommendations, Liberalize Diet, Nepro shake, MVI  Pressure injury of skin on coccyx stage II POA, wound care   Goals of care: Currently full scope of treatment, prognosis remains to be seen at this time-continues to have ongoing significant medical conditions, left leg at risk of amputation.  Daughter  updated at the bedside again and aware about overall serious medical status Discussed with nursing staff  Addendum: Due to patient's persistent rectal bleeding and further downtrending hemoglobin with blood loss anemia 1 more unit PRBC ordered, I have consulted PCCM team for ICU care as BP is alos soft, and patient remains at high risk for decompensation.  DVT prophylaxis:  SCD's Start: 09/09/22 1841 Code Status:   Code Status: Full Code Family Communication: plan of care discussed with patient at bedside.Patient status is: Patient because of left leg ischemia Level of care: ICU  Dispo: The patient is from: home lives with wife            Anticipated disposition:TBD Objective: Vitals last 24 hrs: Vitals:   09/14/22 0800 09/14/22 0820 09/14/22 0900 09/14/22 1000  BP: (!) 143/65  (!) 150/73 130/64  Pulse:    80  Resp: 16 17 12 16   Temp:      TempSrc:      SpO2:  99%  93%  Weight: 75.2 kg     Height:       Weight change:   Physical Examination: General exam: AAox3, mildly depressed, pleasant HEENT:Oral mucosa moist, Ear/Nose WNL grossly, dentition normal. Respiratory system: bilaterally clear BS, no use of accessory muscle Cardiovascular system: S1 & S2 +, regular rate,. Gastrointestinal system: Abdomen soft, NT,ND,BS+ Nervous System:Alert, awake, unable to move left leg  Extremities: LE ankle edema neg, lower extremities warm Skin: No rashes,no icterus. MSK: Normal muscle bulk,tone, power  Left groin with wound VAC in place, HD catheter+  Medications reviewed:  Scheduled Meds:  (feeding supplement) PROSource Plus  30 mL Oral TID BM   acetaminophen  650 mg Oral Q6H   acetaminophen  650 mg Oral Once   aspirin EC  81 mg Oral Q0600   calcitRIOL  0.25 mcg Oral Q T,Th,Sa-HD   calcium acetate  1,334 mg Oral TID WC   Chlorhexidine Gluconate Cloth  6 each Topical Q0600   darbepoetin (ARANESP) injection - DIALYSIS  100 mcg Subcutaneous Q Wed-1800   feeding supplement (NEPRO CARB STEADY)  237 mL Oral TID BM   HYDROmorphone   Intravenous Q4H   [START ON 09/17/2022] leptospermum manuka honey  1 Application Topical Daily   liver oil-zinc oxide   Topical TID   methocarbamol  500 mg Oral TID   multivitamin  1 tablet Oral QHS   pantoprazole  40 mg Oral Daily   peg 3350 powder  0.5 kit Oral Once   polyethylene glycol  17 g Oral BID   rosuvastatin  10 mg  Oral Daily   sodium chloride flush  10-40 mL Intracatheter Q12H   sodium hypochlorite   Irrigation BID  Continuous Infusions:  sodium chloride Stopped (09/08/22 0904)   sodium chloride 10 mL/hr at 09/12/22 1600   sodium chloride     magnesium sulfate bolus IVPB     Diet Order             Diet NPO time specified  Diet effective midnight           Diet clear liquid Room service appropriate? Yes; Fluid consistency: Thin  Diet effective now                   Intake/Output Summary (Last 24 hours) at 09/14/2022 1032 Last data filed at 09/14/2022 0820 Gross per 24 hour  Intake 795 ml  Output 50 ml  Net 745 ml   Net IO Since Admission: 8,609.88 mL [09/14/22  1032]  Wt Readings from Last 3 Encounters:  09/14/22 75.2 kg  08/23/22 64.1 kg  08/15/22 64.9 kg     Unresulted Labs (From admission, onward)     Start     Ordered   09/13/22 2300  Hemoglobin and hematocrit, blood  Now then every 6 hours,   R (with TIMED occurrences)     Question:  Specimen collection method  Answer:  Lab=Lab collect   09/13/22 0725   09/11/22 1442  Vitamin C  Once,   R       Question:  Specimen collection method  Answer:  Lab=Lab collect   09/11/22 1441   09/11/22 1442  Vitamin A  Once,   R       Question:  Specimen collection method  Answer:  Lab=Lab collect   09/11/22 1441   09/11/22 1442  Zinc  Once,   R       Question:  Specimen collection method  Answer:  Lab=Lab collect   09/11/22 1441   09/10/22 1500  Basic metabolic panel  2 times daily,   R (with TIMED occurrences)     Question:  Specimen collection method  Answer:  Lab=Lab collect   09/10/22 0736   09/10/22 0737  CBC  2 times daily,   R (with TIMED occurrences)     Question:  Specimen collection method  Answer:  Lab=Lab collect   09/10/22 0736   08/29/22 0500  Renal function panel (daily at 0500)  Daily,   R      08/28/22 2020          Data Reviewed: I have personally reviewed following labs and imaging studies CBC: Recent Labs   Lab 09/12/22 0225 09/12/22 0359 09/12/22 1528 09/12/22 2138 09/13/22 0303 09/13/22 1104 09/13/22 1606 09/13/22 1959 09/13/22 2326 09/14/22 0720  WBC 15.7*  --  17.4*  --  19.3*  --  16.9*  --   --  20.1*  HGB 7.0*   < > 9.1*   < > 8.5* 7.5* 7.2* 8.3* 9.2* 8.1*  HCT 21.0*   < > 27.4*   < > 25.4* 22.7* 22.0* 25.0* 27.0* 25.4*  MCV 93.3  --  91.9  --  92.7  --  94.4  --   --  95.8  PLT 173  --  170  --  174  --  173  --   --  213   < > = values in this interval not displayed.   Basic Metabolic Panel: Recent Labs  Lab 09/09/22 0411 09/09/22 1209 09/10/22 9604 09/10/22 2024 09/11/22 0441 09/11/22 1545 09/12/22 0255 09/12/22 1528 09/13/22 0303 09/13/22 0304 09/13/22 1606 09/14/22 0720  NA 136   < > 136   < > 136   < > 134* 135 134* 133* 134* 134*  K 4.2   < > 6.0*   < > 3.6   < > 3.9 3.7 3.9 3.9 3.9 4.4  CL 98   < > 98   < > 97*   < > 98 98 100 100 95* 96*  CO2 23   < > 21*   < > 23   < > 23 27 25 24 26  21*  GLUCOSE 120*   < > 112*   < > 89   < > 103* 89 88 86 94 93  BUN 80*   < > 72*   < > 37*   < > 64* 26* 33* 34* 42* 47*  CREATININE 6.51*   < >  5.91*   < > 4.04*   < > 5.48* 2.85* 3.71* 3.68* 4.51* 5.23*  CALCIUM 8.3*   < > 7.8*   < > 7.8*   < > 7.8* 7.7* 7.7* 7.8* 8.0* 8.3*  MG  --   --   --   --   --   --   --   --   --   --   --  2.1  PHOS 6.3*  --  9.0*  --  5.0*  --  7.6*  --   --  5.5*  --   --    < > = values in this interval not displayed.   GFR: Estimated Creatinine Clearance: 13.1 mL/min (A) (by C-G formula based on SCr of 5.23 mg/dL (H)). Liver Function Tests: Recent Labs  Lab 09/09/22 0411 09/10/22 0627 09/11/22 0441 09/12/22 0255 09/13/22 0304  ALBUMIN 1.9* 2.2* 1.9* 1.7* 1.8*  CBG: Recent Labs  Lab 09/13/22 0748 09/13/22 1104 09/13/22 1616 09/14/22 0825 09/14/22 0910  GLUCAP 94 102* 95 61* 64*   No results for input(s): "PROCALCITON", "LATICACIDVEN" in the last 168 hours.   Recent Results (from the past 240 hour(s))  Surgical pcr  screen     Status: None   Collection Time: 09/09/22 11:16 AM   Specimen: Nasal Mucosa; Nasal Swab  Result Value Ref Range Status   MRSA, PCR NEGATIVE NEGATIVE Final   Staphylococcus aureus NEGATIVE NEGATIVE Final    Comment: (NOTE) The Xpert SA Assay (FDA approved for NASAL specimens in patients 26 years of age and older), is one component of a comprehensive surveillance program. It is not intended to diagnose infection nor to guide or monitor treatment. Performed at [facility], [address] [place], [place] [address]   Aerobic/Anaerobic Culture w Gram Stain (surgical/deep wound)     Status: None (Preliminary result)   Collection Time: 09/09/22  1:28 PM   Specimen: Groin, Left; Wound  Result Value Ref Range Status   Specimen Description GROIN LEFT  Final   Special Requests LEFT GROIN HEMATOMA PT ON VANC ANCEF  Final   Gram Stain   Final    ABUNDANT WBC PRESENT, PREDOMINANTLY PMN RARE GRAM POSITIVE COCCI IN PAIRS Performed at [facility], [address] [place], [place] [address]    Culture   Final    RARE METHICILLIN RESISTANT STAPHYLOCOCCUS AUREUS FEW ENTEROCOCCUS FAECIUM VANCOMYCIN RESISTANT ENTEROCOCCUS ISOLATED NO ANAEROBES ISOLATED; CULTURE IN PROGRESS FOR 5 DAYS    Report Status PENDING  Incomplete   Organism ID, Bacteria METHICILLIN RESISTANT STAPHYLOCOCCUS AUREUS  Final   Organism ID, Bacteria ENTEROCOCCUS FAECIUM  Final      Susceptibility   Enterococcus faecium - MIC*    AMPICILLIN >=32 RESISTANT Resistant     VANCOMYCIN >=32 RESISTANT Resistant     GENTAMICIN SYNERGY SENSITIVE Sensitive     * FEW ENTEROCOCCUS FAECIUM   Methicillin resistant staphylococcus aureus - MIC*    CIPROFLOXACIN 1 SENSITIVE Sensitive     ERYTHROMYCIN >=8 RESISTANT Resistant     GENTAMICIN <=0.5 SENSITIVE Sensitive     OXACILLIN >=4 RESISTANT Resistant     TETRACYCLINE <=1 SENSITIVE Sensitive     VANCOMYCIN 1 SENSITIVE Sensitive     TRIMETH/SULFA <=10 SENSITIVE  Sensitive     CLINDAMYCIN <=0.25 SENSITIVE Sensitive     RIFAMPIN <=0.5 SENSITIVE Sensitive     Inducible Clindamycin NEGATIVE Sensitive     * RARE METHICILLIN RESISTANT STAPHYLOCOCCUS AUREUS  Aerobic/Anaerobic Culture w Gram  Stain (surgical/deep wound)     Status: None (Preliminary result)   Collection Time: 09/09/22  1:28 PM   Specimen: Groin, Left; Wound  Result Value Ref Range Status   Specimen Description TISSUE LEFT GROIN  Final   Special Requests PT ON VANC ANCEF LEFT HEMATOMA  Final   Gram Stain   Final    ABUNDANT WBC PRESENT, PREDOMINANTLY PMN RARE GRAM POSITIVE COCCI IN PAIRS Performed at [facility], [address], [place], [place] [address]    Culture   Final    RARE METHICILLIN RESISTANT STAPHYLOCOCCUS AUREUS RARE ENTEROCOCCUS FAECIUM VANCOMYCIN RESISTANT ENTEROCOCCUS ISOLATED NO ANAEROBES ISOLATED; CULTURE IN PROGRESS FOR 5 DAYS    Report Status PENDING  Incomplete   Organism ID, Bacteria METHICILLIN RESISTANT STAPHYLOCOCCUS AUREUS  Final   Organism ID, Bacteria ENTEROCOCCUS FAECIUM  Final      Susceptibility   Enterococcus faecium - MIC*    AMPICILLIN >=32 RESISTANT Resistant     VANCOMYCIN >=32 RESISTANT Resistant     GENTAMICIN SYNERGY SENSITIVE Sensitive     * RARE ENTEROCOCCUS FAECIUM   Methicillin resistant staphylococcus aureus - MIC*    CIPROFLOXACIN 1 SENSITIVE Sensitive     ERYTHROMYCIN >=8 RESISTANT Resistant     GENTAMICIN <=0.5 SENSITIVE Sensitive     OXACILLIN >=4 RESISTANT Resistant     TETRACYCLINE <=1 SENSITIVE Sensitive     VANCOMYCIN <=0.5 SENSITIVE Sensitive     TRIMETH/SULFA <=10 SENSITIVE Sensitive     CLINDAMYCIN <=0.25 SENSITIVE Sensitive     RIFAMPIN <=0.5 SENSITIVE Sensitive     Inducible Clindamycin NEGATIVE Sensitive     * RARE METHICILLIN RESISTANT STAPHYLOCOCCUS AUREUS  Aerobic/Anaerobic Culture w Gram Stain (surgical/deep wound)     Status: None (Preliminary result)   Collection Time: 09/09/22  2:48 PM   Specimen: PATH  Other; Tissue  Result Value Ref Range Status   Specimen Description WOUND  Final   Special Requests LEFT COMMON ILIAC STENTS PT ON VANC ANCEF  Final   Gram Stain   Final    ABUNDANT WBC PRESENT,BOTH PMN AND MONONUCLEAR FEW GRAM POSITIVE COCCI IN PAIRS Performed at [facility], [address], [place], [place] [address]    Culture   Final    ABUNDANT METHICILLIN RESISTANT STAPHYLOCOCCUS AUREUS NO ANAEROBES ISOLATED; CULTURE IN PROGRESS FOR 5 DAYS    Report Status PENDING  Incomplete   Organism ID, Bacteria METHICILLIN RESISTANT STAPHYLOCOCCUS AUREUS  Final      Susceptibility   Methicillin resistant staphylococcus aureus - MIC*    CIPROFLOXACIN 1 SENSITIVE Sensitive     ERYTHROMYCIN >=8 RESISTANT Resistant     GENTAMICIN <=0.5 SENSITIVE Sensitive     OXACILLIN >=4 RESISTANT Resistant     TETRACYCLINE <=1 SENSITIVE Sensitive     VANCOMYCIN 1 SENSITIVE Sensitive     TRIMETH/SULFA <=10 SENSITIVE Sensitive     CLINDAMYCIN <=0.25 SENSITIVE Sensitive     RIFAMPIN <=0.5 SENSITIVE Sensitive     Inducible Clindamycin NEGATIVE Sensitive     * ABUNDANT METHICILLIN RESISTANT STAPHYLOCOCCUS AUREUS  Aerobic/Anaerobic Culture w Gram Stain (surgical/deep wound)     Status: None (Preliminary result)   Collection Time: 09/09/22  2:53 PM   Specimen: PATH Vessel; Tissue  Result Value Ref Range Status   Specimen Description TISSUE  Final   Special Requests VESSEL PT ON VANC ANCEF  Final   Gram Stain   Final    NO WBC SEEN RARE GRAM POSITIVE COCCI Performed at [facility]  Lab, [address] [place], [place] [address]    Culture   Final    RARE STAPHYLOCOCCUS AUREUS SUSCEPTIBILITIES PERFORMED ON PREVIOUS CULTURE WITHIN THE LAST 5 DAYS. NO ANAEROBES ISOLATED; CULTURE IN PROGRESS FOR 5 DAYS    Report Status PENDING  Incomplete    Antimicrobials: Anti-infectives (From admission, onward)    Start     Dose/Rate Route Frequency Ordered Stop   09/12/22 1800  DAPTOmycin (CUBICIN) 600 mg  in sodium chloride 0.9 % IVPB        600 mg 124 mL/hr over 30 Minutes Intravenous  Once 09/12/22 1049 09/12/22 2033   09/11/22 1615  DAPTOmycin (CUBICIN) 600 mg in sodium chloride 0.9 % IVPB        600 mg 124 mL/hr over 30 Minutes Intravenous  Once 09/11/22 1522 09/11/22 2042   09/10/22 1200  vancomycin (VANCOREADY) IVPB 500 mg/100 mL  Status:  Discontinued        500 mg 100 mL/hr over 60 Minutes Intravenous Every T-Th-Sa (Hemodialysis) 09/09/22 0719 09/10/22 0719   09/10/22 0815  vancomycin (VANCOREADY) IVPB 750 mg/150 mL        750 mg 150 mL/hr over 60 Minutes Intravenous  Once 09/10/22 0719 09/10/22 0939   09/10/22 0718  vancomycin variable dose per unstable renal function (pharmacist dosing)  Status:  Discontinued         Does not apply See admin instructions 09/10/22 0719 09/11/22 1522   09/09/22 2000  piperacillin-tazobactam (ZOSYN) IVPB 2.25 g  Status:  Discontinued        2.25 g 100 mL/hr over 30 Minutes Intravenous Every 8 hours 09/09/22 1849 09/10/22 1003   09/09/22 1145  ceFAZolin (ANCEF) 2-4 GM/100ML-% IVPB       Note to Pharmacy: Payton EmeraldHudson, Leigh A: cabinet override      09/09/22 1145 09/09/22 2359   09/08/22 0830  vancomycin (VANCOREADY) IVPB 750 mg/150 mL        750 mg 150 mL/hr over 60 Minutes Intravenous  Once 09/08/22 0743 09/08/22 1004   09/07/22 1200  ceFAZolin (ANCEF) IVPB 2g/100 mL premix       Note to Pharmacy: Give in IR for surgical prophylaxis   2 g 200 mL/hr over 30 Minutes Intravenous  Once 09/07/22 1100 09/07/22 1327   09/07/22 1101  ceFAZolin (ANCEF) 2-4 GM/100ML-% IVPB       Note to Pharmacy: Phebe CollaHOMPSON, LUISA N: cabinet override      09/07/22 1101 09/07/22 1335   09/07/22 0734  vancomycin variable dose per unstable renal function (pharmacist dosing)  Status:  Discontinued         Does not apply See admin instructions 09/07/22 0734 09/09/22 0718   09/05/22 1600  vancomycin (VANCOREADY) IVPB 750 mg/150 mL        750 mg 150 mL/hr over 60 Minutes Intravenous   Once 09/05/22 1505 09/05/22 1717   09/02/22 1227  ceFAZolin (ANCEF) 2-4 GM/100ML-% IVPB       Note to Pharmacy: Isabel CapriceLeSane, Destiny: cabinet override      09/02/22 1227 09/03/22 0044   09/02/22 0520  vancomycin variable dose per unstable renal function (pharmacist dosing)  Status:  Discontinued         Does not apply See admin instructions 09/02/22 0521 09/05/22 0940   08/31/22 1800  vancomycin (VANCOREADY) IVPB 750 mg/150 mL  Status:  Discontinued        750 mg 150 mL/hr over 60 Minutes Intravenous Every 24 hours 08/31/22 0822 09/02/22 0521   08/30/22  1800  vancomycin (VANCOREADY) IVPB 1500 mg/300 mL        1,500 mg 150 mL/hr over 120 Minutes Intravenous STAT 08/30/22 1739 08/30/22 2139      Culture/Microbiology    Component Value Date/Time   SDES TISSUE 09/09/2022 1453   SPECREQUEST VESSEL PT ON VANC ANCEF 09/09/2022 1453   CULT  09/09/2022 1453    RARE STAPHYLOCOCCUS AUREUS SUSCEPTIBILITIES PERFORMED ON PREVIOUS CULTURE WITHIN THE LAST 5 DAYS. NO ANAEROBES ISOLATED; CULTURE IN PROGRESS FOR 5 DAYS    REPTSTATUS PENDING 09/09/2022 1453  Other culture-see note  Radiology Studies: DG Chest Port 1 View  Result Date: 09/13/2022 CLINICAL DATA:  Fluid overload. EXAM: PORTABLE CHEST 1 VIEW COMPARISON:  08/28/2022 FINDINGS: Stable enlarged cardiac silhouette, mildly prominent upper lung zone pulmonary vasculature and mild chronic interstitial prominence. No Kerley lines, airspace consolidation or pleural fluid. Right jugular catheter tip in the inferior aspect of the superior vena cava near the superior cavoatrial junction. No pneumothorax. Unremarkable bones. IMPRESSION: 1. No acute abnormality. 2. Stable cardiomegaly, mild pulmonary vascular congestion and mild chronic interstitial lung disease. Electronically Signed   By: Beckie Salts M.D.   On: 09/13/2022 13:02   CT ANGIO GI BLEED  Result Date: 09/13/2022 CLINICAL DATA:  GI bleed. EXAM: CTA ABDOMEN AND PELVIS WITHOUT AND WITH CONTRAST  TECHNIQUE: Multidetector CT imaging of the abdomen and pelvis was performed using the standard protocol during bolus administration of intravenous contrast. Multiplanar reconstructed images and MIPs were obtained and reviewed to evaluate the vascular anatomy. RADIATION DOSE REDUCTION: This exam was performed according to the departmental dose-optimization program which includes automated exposure control, adjustment of the mA and/or kV according to patient size and/or use of iterative reconstruction technique. CONTRAST:  OMNIPAQUE IOHEXOL 350 MG/ML SOLN COMPARISON:  09/09/2022 and 09/05/2022 as well as 08/12/2022 and 08/28/2022. FINDINGS: VASCULAR Aorta: Extensive stable 3-4 mm penetrating ulcer along the left posterolateral wall of the infrarenal segment. Atherosclerotic plaque throughout the abdominal aorta which is normal in caliber. Celiac: Calcified plaque at the origin and throughout. Short segment moderate stenosis near the origin. Otherwise patent. SMA: Significant calcified plaque at the origin calcified and noncalcified plaque proximally causing moderate short segment focal narrowing. SMA is otherwise patent distally. 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 Mild-to-moderate calcified plaque throughout both renal arteries. Right renal artery is patent. IMA: Calcified plaque proximally but otherwise patent. Inflow: Right common and external iliac artery stent present which is patent. Left common iliac artery stent patent. Left external iliac artery is patent. 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 Proximal Outflow: Prominent, but patent right common femoral artery with patent bifurcation. Surgical changes adjacent the common femoral artery. There is mild prominence of the left common femoral artery which is otherwise patent. Patent from the femoral artery and collaterals. Immediate  occlusion of the left superficial femoral artery unchanged. Catheter extending anteriorly from the left inguinal region running adjacent the common femoral/external iliac vessels and then along the anterior border of the left iliopsoas muscle. Veins: No obvious venous abnormality within the limitations of this arterial phase study. Review of the MIP images confirms the above findings. NON-VASCULAR Lower chest: Mild stable cardiomegaly. Calcified plaque over the right coronary artery and descending thoracic aorta. Persistent nodular airspace process over the lung bases with largest nodule in the left base measuring 1.1 cm without significant change. Largest nodule over the right base measures 1.1 cm  which is slightly smaller. A few other smaller subcentimeter nodules bilaterally unchanged. This nodule airspace process is new since 08/28/2022, but not significantly changed since 09/05/2022. Mild posterior bibasilar dependent atelectasis. No effusion. Hepatobiliary: Liver, gallbladder and biliary tree are normal. Pancreas: Stable homogeneous well-defined oval mass overlying the region of the pancreatic tail measuring approximately 2.4 cm. Spleen: Normal. Adrenals/Urinary Tract: Adrenal glands are normal. Kidneys slightly small with cortical thinning bilaterally. Decreased perfusion of the left kidney compared to the right. No hydronephrosis or focal mass. Bladder is somewhat contracted. Visualized ureters unremarkable. Stomach/Bowel: Stomach and small bowel are normal. Moderate diverticulosis of the colon most notable over the distal descending and sigmoid colon. Appendix is normal. No definite evidence of GI bleed. Lymphatic: No adenopathy. Reproductive: Normal. Other: Stable retroperitoneal/prevesical space hematoma extending superiorly medially deep to the rectus abdominus muscles. This measures proximally 6 x 11.1 cm. Hematoma also extends along the left iliopsoas muscle without significant change. Musculoskeletal:  No focal abnormality. IMPRESSION: VASCULAR: 1. No definite evidence of GI bleed. 2. 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 3. Atherosclerotic plaque throughout the abdominal aorta and branch vessels as described. Short segment moderate stenosis of the proximal celiac artery and SMA. 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 Decreased perfusion of the left kidney compared to the right. 4. Stable retroperitoneal/prevesical space hematoma extending superiorly medially deep to the rectus abdominus muscles with largest measurement 6 x 11.1 cm. Hematoma also extends along the left iliopsoas muscle without significant change. 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 5. 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 NONVASCULAR: 1. No acute findings in the abdomen or pelvis. 2. Persistent nodular airspace process over the lung bases with largest nodule in the left base measuring 1.1 cm without significant change since 09/05/2022. Largest nodule over the right base measures 1.1 cm which is slightly smaller. A few other smaller subcentimeter nodules bilaterally unchanged. Findings likely infectious/inflammatory in nature. Recommend follow-up CT 6 weeks. 3. 2.4 cm homogeneous well-defined oval mass overlying the region of the pancreatic tail without significant change from 05/05/2021 possibly a splenule. Consider follow-up CT 1 year. 4. Colonic diverticulosis without evidence of acute diverticulitis. 5. Aortic atherosclerosis. Atherosclerotic coronary artery disease. 6. Stable cardiomegaly. Aortic Atherosclerosis (ICD10-I70.0). Electronically Signed   By: Elberta Fortis M.D.   On: 09/13/2022 10:13     LOS: 17 days   Lanae Boast, MD [facility]  09/14/2022, 10:32 AM

## 2022-09-14 NOTE — Progress Notes (Signed)
Nutrition Follow-up  DOCUMENTATION CODES:   Non-severe (moderate) malnutrition in context of chronic illness  INTERVENTION:   Calorie Count started on Friday but incomplete due to GI bleed over the weekend and diet downgraded to CL. D/C calorie for now.  Discussed nutrition poc with Daughter. Initially held off on Cortrak placement as pt on solid diet and eating better, taking supplements. With this set back, may need to reconsider Cortrak placement. Plan to reassess post colonoscopy  Boost Breeze po TID while on CL, each supplement provides 250 kcal and 9 grams of protein  30 ml ProSource Plus TID, each supplement provides 100 kcals and 15 grams protein.   D/C calorie count as not appropriate currently given CL diet  Renal MVI daily  Recommend holding phos binders while on CL, GI bleed  Wound healing vitamin labs pending (Vit C, Zinc, Vit A)  NUTRITION DIAGNOSIS:   Moderate Malnutrition (in the context of chronic illness (ESRD)) related to poor appetite as evidenced by moderate fat depletion, severe muscle depletion.  Continues but being addressed  GOAL:   Patient will meet greater than or equal to 90% of their needs  Not met  MONITOR:   PO intake, Supplement acceptance, I & O's, Labs, Weight trends  REASON FOR ASSESSMENT:   Consult, Ventilator Enteral/tube feeding initiation and management (trickle tube feeds)  ASSESSMENT:   Pt admitted with abdominal pain, nausea, diaphoresis and unable to feel L foot d/t L femoral pseudoaneurysm. Pt was hypotensive with lactic acidosis upon admission. PMH significant for ESRD, PAD, COPD, HLD, HTN, HFrEF 35-40%.  4/06 Flex Sig with 5 non-bleeding ulcers 4/07 CTA negative for acute bleeding  Noted next iHD scheduled for tomorrow  +GI bleed over the weekend, frank red blood per rectum this AM.   Pt sleepy this AM, PCA. Daughter at bedside  Currently on CL diet due to acute GI bleed. Plan was for colonoscopy today but due to  inadequate prep, rescheduled for tomorrow  Per daughter, pt will not eat many of the CL items including Jello, Tea, broth. Pt is taking Pro-source supplement, ok to continue on CL diet. Also going to provide Boost Breeze instead of Nepro while on CL  Noted hypoglycemia this AM  Noted WOC followed up with pt re wounds, DTI to sacrum/buttocks now evolved into unstageable. PT consulted for hydrotherapy  Wound VAC changes on Mondays and Thursdays by Vascular. Noted pt will still likely require limb loss L AKA) down the road.   Vitamin Labs:  CRP: 11.9 (H) Vitamin A: pending Vitamin C: pending Zinc: pending  Labs: phosphorus 7.6 (H), sodium 134 (L), potassium 4.4 (wdl) Meds: reviewed   Diet Order:   Diet Order             Diet NPO time specified  Diet effective midnight           Diet clear liquid Room service appropriate? Yes; Fluid consistency: Thin  Diet effective now                   EDUCATION NEEDS:   Education needs have been addressed  Skin:  Skin Assessment: Skin Integrity Issues: Skin Integrity Issues:: Unstageable, Other (Comment) DTI: R/L heel Stage II: n/a Unstageable: sacrum (DTI that evlolved into unstageable)-hydrotherapy initiated Wound Vac: L groin Incisions: L groin (closed) Other: Buttocks and gluteal crease/upper thighs with scattered areas of partial thickness and full thickness skin loss  Last BM:  4/8 +stool with BRBPR  Height:   Ht Readings from  Last 1 Encounters:  09/12/22 5\' 8"  (1.727 m)    Weight:   Wt Readings from Last 1 Encounters:  09/14/22 75.2 kg    Ideal Body Weight:  70 kg  BMI:  Body mass index is 25.21 kg/m.  Estimated Nutritional Needs:   Kcal:  1900-2100  Protein:  95-110g/d  Fluid:  1L + UOP    Romelle Starcher MS, RDN, LDN, CNSC Registered Dietitian 3 Clinical Nutrition RD Pager and On-Call Pager Number Located in [place]

## 2022-09-15 ENCOUNTER — Inpatient Hospital Stay (HOSPITAL_COMMUNITY): Payer: Medicare Other | Admitting: Certified Registered Nurse Anesthetist

## 2022-09-15 ENCOUNTER — Encounter (HOSPITAL_COMMUNITY)
Admission: EM | Disposition: A | Discharge status: Hospice | Payer: Self-pay | Source: Home / Self Care | Attending: Internal Medicine

## 2022-09-15 ENCOUNTER — Encounter (HOSPITAL_COMMUNITY): Payer: Self-pay | Admitting: Pulmonary Disease

## 2022-09-15 DIAGNOSIS — D126 Benign neoplasm of colon, unspecified: Secondary | ICD-10-CM

## 2022-09-15 DIAGNOSIS — A419 Sepsis, unspecified organism: Secondary | ICD-10-CM | POA: Diagnosis not present

## 2022-09-15 DIAGNOSIS — K5730 Diverticulosis of large intestine without perforation or abscess without bleeding: Secondary | ICD-10-CM

## 2022-09-15 DIAGNOSIS — K5190 Ulcerative colitis, unspecified, without complications: Secondary | ICD-10-CM | POA: Diagnosis not present

## 2022-09-15 DIAGNOSIS — Z87891 Personal history of nicotine dependence: Secondary | ICD-10-CM

## 2022-09-15 DIAGNOSIS — K640 First degree hemorrhoids: Secondary | ICD-10-CM

## 2022-09-15 DIAGNOSIS — K921 Melena: Secondary | ICD-10-CM | POA: Diagnosis not present

## 2022-09-15 DIAGNOSIS — K922 Gastrointestinal hemorrhage, unspecified: Secondary | ICD-10-CM | POA: Diagnosis not present

## 2022-09-15 DIAGNOSIS — J449 Chronic obstructive pulmonary disease, unspecified: Secondary | ICD-10-CM

## 2022-09-15 DIAGNOSIS — A4102 Sepsis due to Methicillin resistant Staphylococcus aureus: Secondary | ICD-10-CM | POA: Diagnosis not present

## 2022-09-15 DIAGNOSIS — D123 Benign neoplasm of transverse colon: Secondary | ICD-10-CM

## 2022-09-15 DIAGNOSIS — E440 Moderate protein-calorie malnutrition: Secondary | ICD-10-CM | POA: Diagnosis not present

## 2022-09-15 DIAGNOSIS — K633 Ulcer of intestine: Secondary | ICD-10-CM

## 2022-09-15 DIAGNOSIS — R6521 Severe sepsis with septic shock: Secondary | ICD-10-CM | POA: Diagnosis not present

## 2022-09-15 DIAGNOSIS — K529 Noninfective gastroenteritis and colitis, unspecified: Secondary | ICD-10-CM

## 2022-09-15 HISTORY — PX: COLONOSCOPY: SHX5424

## 2022-09-15 HISTORY — PX: BIOPSY: SHX5522

## 2022-09-15 HISTORY — PX: HEMOSTASIS CLIP PLACEMENT: SHX6857

## 2022-09-15 HISTORY — PX: POLYPECTOMY: SHX5525

## 2022-09-15 LAB — TYPE AND SCREEN
ABO/RH(D): O POS
ABO/RH(D): O POS
Antibody Screen: NEGATIVE
Unit division: 0
Unit division: 0
Unit division: 0
Unit division: 0
Unit division: 0

## 2022-09-15 LAB — GLUCOSE, CAPILLARY
Glucose-Capillary: 101 mg/dL — ABNORMAL HIGH (ref 70–99)
Glucose-Capillary: 88 mg/dL (ref 70–99)
Glucose-Capillary: 88 mg/dL (ref 70–99)
Glucose-Capillary: 82 mg/dL (ref 70–99)
Glucose-Capillary: 110 mg/dL — ABNORMAL HIGH (ref 70–99)

## 2022-09-15 LAB — RENAL FUNCTION PANEL
Albumin: 1.7 g/dL — ABNORMAL LOW (ref 3.5–5.0)
Anion gap: 15 (ref 5–15)
BUN: 59 mg/dL — ABNORMAL HIGH (ref 8–23)
CO2: 19 mmol/L — ABNORMAL LOW (ref 22–32)
Calcium: 7.7 mg/dL — ABNORMAL LOW (ref 8.9–10.3)
Chloride: 102 mmol/L (ref 98–111)
Creatinine, Ser: 6.18 mg/dL — ABNORMAL HIGH (ref 0.61–1.24)
GFR, Estimated: 9 mL/min — ABNORMAL LOW (ref >60)
Glucose, Bld: 92 mg/dL (ref 70–99)
Phosphorus: 8.8 mg/dL — ABNORMAL HIGH (ref 2.5–4.6)
Potassium: 4.9 mmol/L (ref 3.5–5.1)
Sodium: 136 mmol/L (ref 135–145)

## 2022-09-15 LAB — HEMOGLOBIN AND HEMATOCRIT, BLOOD
HCT: 21.8 % — ABNORMAL LOW (ref 39.0–52.0)
HCT: 23 % — ABNORMAL LOW (ref 39.0–52.0)
HCT: 21.9 % — ABNORMAL LOW (ref 39.0–52.0)
HCT: 26.9 % — ABNORMAL LOW (ref 39.0–52.0)
Hemoglobin: 7.1 g/dL — ABNORMAL LOW (ref 13.0–17.0)
Hemoglobin: 7.4 g/dL — ABNORMAL LOW (ref 13.0–17.0)
Hemoglobin: 7.4 g/dL — ABNORMAL LOW (ref 13.0–17.0)
Hemoglobin: 8.9 g/dL — ABNORMAL LOW (ref 13.0–17.0)

## 2022-09-15 LAB — BPAM RBC
Blood Product Expiration Date: 202405042359
Blood Product Expiration Date: 202405042359
Blood Product Expiration Date: 202405042359
Blood Product Expiration Date: 202405072359
Blood Product Expiration Date: 202405072359
ISSUE DATE / TIME: 202404050537
ISSUE DATE / TIME: 202404061124
ISSUE DATE / TIME: 202404061124
ISSUE DATE / TIME: 202404071610
ISSUE DATE / TIME: 202404081305
Unit Type and Rh: 5100
Unit Type and Rh: 5100
Unit Type and Rh: 5100
Unit Type and Rh: 5100
Unit Type and Rh: 5100

## 2022-09-15 LAB — C-REACTIVE PROTEIN: CRP: 10 mg/dL — ABNORMAL HIGH (ref <1.0)

## 2022-09-15 LAB — PREPARE RBC (CROSSMATCH)

## 2022-09-15 LAB — ZINC: Zinc: 64 ug/dL (ref 44–115)

## 2022-09-15 LAB — VITAMIN A: Vitamin A (Retinoic Acid): 9.2 ug/dL — ABNORMAL LOW (ref 22.0–69.5)

## 2022-09-15 SURGERY — COLONOSCOPY
Anesthesia: Monitor Anesthesia Care

## 2022-09-15 MED ORDER — SODIUM CHLORIDE 0.9 % IV SOLN: INTRAVENOUS | Status: DC | PRN

## 2022-09-15 MED ORDER — PROPOFOL 10 MG/ML IV BOLUS
INTRAVENOUS | Status: DC | PRN
  Administered 2022-09-15: 20 mg via INTRAVENOUS

## 2022-09-15 MED ORDER — HEPARIN SODIUM (PORCINE) 1000 UNIT/ML IJ SOLN
INTRAMUSCULAR | Status: AC
  Administered 2022-09-15: 3200 [IU] via INTRAVENOUS_CENTRAL
  Filled 2022-09-15: qty 4

## 2022-09-15 MED ORDER — DARBEPOETIN ALFA 200 MCG/0.4ML IJ SOSY
200.0000 ug | PREFILLED_SYRINGE | INTRAMUSCULAR | Status: DC
  Administered 2022-09-16 – 2022-10-21 (×6): 200 ug via SUBCUTANEOUS
  Filled 2022-09-15 (×6): qty 0.4

## 2022-09-15 MED ORDER — PHENYLEPHRINE HCL-NACL 20-0.9 MG/250ML-% IV SOLN
INTRAVENOUS | Status: DC | PRN
  Administered 2022-09-15: 25 ug/min via INTRAVENOUS

## 2022-09-15 MED ORDER — SODIUM CHLORIDE 0.9% IV SOLUTION: Freq: Once | INTRAVENOUS | Status: DC

## 2022-09-15 MED ORDER — SODIUM CHLORIDE 0.9 % IV SOLN: INTRAVENOUS | Status: DC

## 2022-09-15 MED ORDER — PROPOFOL 500 MG/50ML IV EMUL
INTRAVENOUS | Status: DC | PRN
  Administered 2022-09-15: 100 ug/kg/min via INTRAVENOUS

## 2022-09-15 MED ORDER — NEPRO/CARBSTEADY PO LIQD
237.0000 mL | Freq: Three times a day (TID) | ORAL | Status: DC
  Administered 2022-09-16 – 2022-09-23 (×12): 237 mL via ORAL

## 2022-09-15 NOTE — Progress Notes (Signed)
Physical Therapy Wound Treatment Patient Details  Name: Albert Harris MRN: [id number] Date of Birth: [birth year]  Today's Date: 09/15/2022 Time: 1100-1154 Time Calculation (min): 54 min  Subjective  Subjective Assessment Subjective: Pt pleasant and agreeable to wound therapy (HOH) Patient and Family Stated Goals: Heal wound Date of Onset:  (Unknown) Prior Treatments: Dressing changes  Pain Score:  Faces pain scale 8/10 in feet (L>R), LE's, and L hip. Does not complain of pain at sacrum.  Wound Assessment  Pressure Injury 08/28/22 Sacrum Medial Unstageable - Full thickness tissue loss in which the base of the injury is covered by slough (yellow, tan, gray, green or brown) and/or eschar (tan, brown or black) in the wound bed. Sacral wound with eschar coveri (Active)  Dressing Type Foam - Lift dressing to assess site every shift;Gauze (Comment);Barrier Film (skin prep);Moist to moist;Dakin's-soaked gauze 09/15/22 1501  Dressing Clean, Dry, Intact;Changed 09/15/22 1501  Dressing Change Frequency Twice a day 09/15/22 1501  State of Healing Eschar 09/15/22 1501  Site / Wound Assessment Brown;Yellow;Pink 09/15/22 1501  % Wound base Red or Granulating 10% 09/15/22 1501  % Wound base Yellow/Fibrinous Exudate 70% 09/15/22 1501  % Wound base Black/Eschar 20% 09/15/22 1501  % Wound base Other/Granulation Tissue (Comment) 0% 09/15/22 1501  Peri-wound Assessment Purple 09/15/22 1501  Wound Length (cm) 10.5 cm 09/14/22 1344  Wound Width (cm) 11.3 cm 09/14/22 1344  Wound Depth (cm) 0.1 cm 09/14/22 1344  Wound Surface Area (cm^2) 118.65 cm^2 09/14/22 1344  Wound Volume (cm^3) 11.87 cm^3 09/14/22 1344  Tunneling (cm) 0 09/14/22 1344  Undermining (cm) 0 09/14/22 1344  Margins Unattached edges (unapproximated) 09/15/22 1501  Drainage Amount Moderate 09/15/22 1501  Drainage Description Serosanguineous 09/15/22 1501  Treatment Debridement (Selective);Irrigation;Other (Comment) 09/15/22 1501       Selective Debridement (non-excisional) Selective Debridement (non-excisional) - Location: Sacrum Selective Debridement (non-excisional) - Tools Used: Forceps, Scalpel Selective Debridement (non-excisional) - Tissue Removed: Eschar    Wound Assessment and Plan  Wound Therapy - Assess/Plan/Recommendations Wound Therapy - Clinical Statement: Progressing with debridement. Pt with increased pain this session however not at sacrum, complains more of pain at feet (L>R), LE's, and L hip. This patient will benefit from continued wound therapy for selective removal of unviable tissue, to decrease bioburden, and promote wound bed healing. Wound Therapy - Functional Problem List: Decreased tolerance for repositioning and OOB due to wound; global weakness in the setting of extended hospitalization and now ICU stay. Factors Delaying/Impairing Wound Healing: Immobility, Multiple medical problems, Polypharmacy Hydrotherapy Plan: Debridement, Dressing change, Patient/family education Wound Therapy - Frequency: 2X / week Wound Therapy - Follow Up Recommendations: dressing changes by RN  Wound Therapy Goals- Improve the function of patient's integumentary system by progressing the wound(s) through the phases of wound healing (inflammation - proliferation - remodeling) by: Wound Therapy Goals - Improve the function of patient's integumentary system by progressing the wound(s) through the phases of wound healing by: Decrease Necrotic Tissue to: 20% Decrease Necrotic Tissue - Progress: Progressing toward goal Increase Granulation Tissue to: 80% Increase Granulation Tissue - Progress: Progressing toward goal Goals/treatment plan/discharge plan were made with and agreed upon by patient/family: Yes Time For Goal Achievement: 7 days Wound Therapy - Potential for Goals: Good  Goals will be updated until maximal potential achieved or discharge criteria met.  Discharge criteria: when goals achieved, discharge from  hospital, MD decision/surgical intervention, no progress towards goals, refusal/missing three consecutive treatments without notification or medical reason.  GP  Charges PT Wound Care Charges $Wound Debridement up to 20 cm: < or equal to 20 cm $ Wound Debridement each add'l 20 sqcm: 5 $PT Hydrotherapy Visit: 1 Visit       [name] 09/15/2022, 3:05 PM  Conni Slipper, PT, DPT Acute Rehabilitation Services Secure Chat Preferred Office: [phone]

## 2022-09-15 NOTE — Procedures (Signed)
HD Note:  Some information was entered later than the data was gathered due to patient care needs. The stated time with the data is accurate.  Patient treatment done at bedside Alert and oriented.  Informed consent signed and in chart.   TX duration:3.5 hours  Patient tolerated well.   The machine clotted very soon into treatment and had to be set up again.  It clotted the second time and this Clinical research associate contacted Dr. Marisue Humble.  His order was to try one more set up before stopping treatment. The third cartridge set up clotted and treatment was stopped.  See flowsheet for details  Alert, without acute distress.  Hand-off given to patient's nurse.   Access used: Upper right chest HD catheter Access issues: Initial set up required extra flushing prior to the start of treatment.  Extra flushing was necessary frequently during treatment.  Total UF removed: 800 ml    [facility]

## 2022-09-15 NOTE — Progress Notes (Addendum)
NAME:  Albert Harris, MRN:  [id number], DOB:  August 28, 1955, LOS: 18 ADMISSION DATE:  08/28/2022, CONSULTATION DATE:  08/28/22 REFERRING MD: Constance Goltz , CHIEF COMPLAINT:  Abdominal pain  History of Present Illness:  66 yo male brought to ER with abdominal pain, nausea, diaphoresis and unable to feel his Lt foot.  Hypotensive with lactic acidosis in ER.  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  Hx of ESRD and had hyperkalemia.  Had emergent CRRT.  Vascular consulted and taken to OR.    Pertinent  Medical History  ESRD -AV fistula (no flow tonight), PAD, COPD, HLD, HTN, HFrEF 35-40%, PAD  Significant Hospital Events: Including procedures, antibiotic start and stop dates in addition to other pertinent events   3/22 Admit, emergent CRRT, to OR for repair of distal Lt external iliac artery 3/24 Extubated. CT head obtained for altered mental status did not show any acute abnormalities 3/26 Off CRRT and pressors, Vascular surgery consulted for clotted AVG 3/27 AV graft thrombectomy attempted in OR but patient became hemodynamically unstable and procedure was aborted.  Was on pressors briefly 3/28 dialysis followed by CVC and aline removal for line holiday 3/29 MRI brain ordered for LLE weakness and found with right subinsular 42mm infarct. Neuro consulted 3/30 CT A/P increased hematoma in left pelvic region associated with Hg drop 9>7 4/1 hgb stable, TEE, new tunneled line planned 4/3 L groin bleeding. Taken to OR urgently by VVS for iliofemoral bypass, removal of infected patch, sartorius flap.  4/6, 4/7 ongoing rectal bleeding. Transfused both days.  4/6 flex sig with multiple non-bleeding ulcers.  4/8 ongoing rectal bleeding. Transfusing again. Did not drink bowel prep for colonoscopy. Aborted.  PCCM called back.   Interim History / Subjective:  Patient's H&H dropped down to 7.1 then it remained stable Had little bit of rectal bleeding yesterday but nothing since  overnight Blood pressure remained stable Did take prep for colonoscopy  Objective   Blood pressure (!) 152/74, pulse 71, temperature 97.6 F (36.4 C), temperature source Oral, resp. rate 14, height 5\' 8"  (1.727 m), weight 74.5 kg, SpO2 100 %.    FiO2 (%):  [21 %] 21 %   Intake/Output Summary (Last 24 hours) at 09/15/2022 0748 Last data filed at 09/15/2022 0700 Gross per 24 hour  Intake 2026.69 ml  Output 50 ml  Net 1976.69 ml   Filed Weights   09/13/22 0500 09/14/22 0800 09/15/22 0500  Weight: 67.9 kg 75.2 kg 74.5 kg   Physical Exam:  Physical exam: General: Acute on chronically ill-appearing male, lying on the bed HEENT: [place]/AT, eyes anicteric.  moist mucus membranes Neuro: Alert, awake following commands Chest: Coarse breath sounds, no wheezes or rhonchi Heart: Regular rate and rhythm, no murmurs or gallops Abdomen: Soft, nontender, nondistended, bowel sounds present Skin: No rash Extremities: Wound VAC in place in left lower extremity in groin region  Labs and images were reviewed  Resolved Hospital Problem list   Septic shock  Assessment & Plan:  Acute blood loss anemia secondary to hematochezia Acute lower GI bleeding Flex sig 4/6 with 5 non-bleeding ulcers identified S/p 3 units total PRBC  His hemoglobin dropped down to 7.1 overnight since then it has been stable Will transfuse 1 more unit PRBC Due to prep overnight He underwent colonoscopy which showed multiple rectal ulcers, one of the ulcers showed visible bleeding vessel, clips applied and hemorrhoids Monitor H&H and transfuse if hemoglobin less than 8  Retroperitoneal hemorrhage, stable  Peripheral vascular disease Lt common femoral artery pseudoaneurysm s/p Lt groin hematoma evacuation, primary repair of distal external iliac artery. Further complicated 4/3 by L groin bleeding and R/p bleed with new pseudoaneurysm and aterial thrombus. Now s/p  iliofemoral bypass, removal of infected patch, sartorius flap.   Postop care per vascular surgery He is off heparin Continue wound VAC  MRSA bacteremia 2/2 post-op wound infection with MRSA and VRE TEE negative for veg Surgical cultures grew MRSA and E. Faecium/VRE [facility] 3/27 negative Continue daptomycin on dialysis days Infectious disease following  ESRD Hyponatremia, improved Nephrology is following HD per nephrology via RIJ tunneled catheter.  Next hemodialysis scheduled for today Closely monitor electrolytes  Chronic systolic CHF with EF 35 to 40% on Echo from 06/29/22. Hx of pulmonary hypertension. Continue to hold toprol, plavix for now Hold aspirin for today, resume tomorrow  Acute metabolic encephalopathy from uremia, acidosis. Acute 57mm right subinsular stroke  CT head on 3/24 was normal Mental status has improved  Acute respiratory failure with hypoxia COPD, not in exacerbation Titrate nasal cannula oxygen with O2 sat goal 92% Continue gentle diuresis Continue nebs  Moderate malnutrition Continue dietary supplements  Best Practice (right click and "Reselect all SmartList Selections" daily)   Diet/type: Resume Regular consistency (see orders) DVT prophylaxis: SCD GI prophylaxis: PPI. Lines: Dialysis Catheter tunneled  Foley:  N/A Code Status:  full code Last date of multidisciplinary goals of care discussion [pending]   This patient is critically ill with multiple organ system failure which requires frequent high complexity decision making, assessment, support, evaluation, and titration of therapies. This was completed through the application of advanced monitoring technologies and extensive interpretation of multiple databases.  During this encounter critical care time was devoted to patient care services described in this note for 32 minutes.    Cheri Fowler, MD [facility] See Amion for pager If no response to pager, please call [phone] until 7pm After 7pm, Please call E-link  [phone]

## 2022-09-15 NOTE — Anesthesia Preprocedure Evaluation (Addendum)
Anesthesia Evaluation  Patient identified by MRN, date of birth, ID band Patient awake    Reviewed: Allergy & Precautions, NPO status , Patient's Chart, lab work & pertinent test results  Airway Mallampati: III  TM Distance: >3 FB Neck ROM: Full    Dental  (+) Edentulous Upper, Missing, Loose,    Pulmonary asthma , COPD, former smoker   Pulmonary exam normal breath sounds clear to auscultation       Cardiovascular hypertension, Pt. on home beta blockers and Pt. on medications + Peripheral Vascular Disease and +CHF  Normal cardiovascular exam Rhythm:Regular Rate:Normal  TEE 2024  1. No vegetations or evidence of SBE.   2. Left ventricular ejection fraction, by estimation, is 50 to 55%. The  left ventricle has low normal function. There is severe left ventricular  hypertrophy.   3. Right ventricular systolic function is normal. The right ventricular  size is normal.   4. Left atrial size was moderately dilated. No left atrial/left atrial  appendage thrombus was detected.   5. Right atrial size was moderately dilated.   6. The mitral valve is abnormal. Mild mitral valve regurgitation.   7. The aortic valve is tricuspid. There is moderate calcification of the  aortic valve. There is moderate thickening of the aortic valve. Aortic  valve regurgitation is not visualized. Mild aortic valve stenosis.     Neuro/Psych CVA  negative psych ROS   GI/Hepatic ,GERD  ,,(+) Cirrhosis     substance abuse  alcohol use  Endo/Other  negative endocrine ROS    Renal/GU ESRF and DialysisRenal disease  negative genitourinary   Musculoskeletal negative musculoskeletal ROS (+)    Abdominal   Peds  Hematology  (+) Blood dyscrasia (plavix), anemia Lab Results      Component                Value               Date                      WBC                      20.1 (H)            09/14/2022                HGB                      7.1 (L)              09/15/2022                HCT                      21.8 (L)            09/15/2022                MCV                      95.8                09/14/2022                PLT                      213  09/14/2022              Anesthesia Other Findings   Reproductive/Obstetrics                             Anesthesia Physical Anesthesia Plan  ASA: 3  Anesthesia Plan: MAC   Post-op Pain Management:    Induction: Intravenous  PONV Risk Score and Plan: Propofol infusion and Treatment may vary due to age or medical condition  Airway Management Planned: Natural Airway  Additional Equipment:   Intra-op Plan:   Post-operative Plan:   Informed Consent: I have reviewed the patients History and Physical, chart, labs and discussed the procedure including the risks, benefits and alternatives for the proposed anesthesia with the patient or authorized representative who has indicated his/her understanding and acceptance.     Dental advisory given  Plan Discussed with: CRNA  Anesthesia Plan Comments:        Anesthesia Quick Evaluation

## 2022-09-15 NOTE — Progress Notes (Signed)
PROGRESS NOTE Albert Harris  ZDG:[id number] DOB: 1956-05-23 DOA: 08/28/2022 PCP: [name], NP  Brief Narrative/Hospital Course: 67 year old male with history of ESRD on HD TTS, PVD,COPD, essential hypertension, chronic systolic congestive heart failure with ejection fraction 35 to 40%, presented with abdominal pain nausea and diaphoresis.Patient was also hypotensive with a lactic acidosis and was admitted to ICU,with a pulseless left lower extremity was found to have femoral pseudoaneurysm.Culture came back with MRSA bacteremia-TEE neg for .   Events: 08/29/22: -Admitted to ICU-on vasopressors for septic shock -s/p OR with Dr. Karin Lieu for left femoral exploration with pseudoaneurysm repair,hematoma evacuation>showed dehiscence at proximal aspect of bovine pericardial patch at the suture line.  -Emergent CRRT for severe hyperkalemia  09/08/22 Transferred to [facility] service  09/09/22 CTA for bleeding on left groin: showed Thrombosis of LEFT CFA pseudoaneurysm with new 3.5 cm filling defect, consistent with arterial thrombus, extending from the PSA neck and into the distal CFA. This is at risk for distal embolization and potential acute LEFT lower extremity ischemia.New 8 mm distal LEFT EIA pseudoaneurysm, medial to the stent distal landing zone. Mild interval enlargement of retroperitoneal hematoma, in a craniocaudal dimension measuring 12.2 cm (previously 11.2 cm).No CTA evidence of active extravasation. Patent RIGHT femoral-to-popliteal graft and bilateral iliac stents.Severe burden of splanchnic arterial atherosclerosis, at risk for chronic mesenteric ischemia. 09/09/22: urgent OR:S/P left common iliac to profunda femoris bypass with ipsilateral nonreversed greater saphenous vein in anatomic tunnel, left common iliac artery angioplasty and stenting, left greater saphenous vein harvest, left sartorius muscle of left groin, left lower extremity angiography with first-order cannulation Transferred to 2H  post op 09/12/22 am: 2 BRBPR> s/p Flex sig>showed 5 rectal ulcers, not actively bleeding- stool brown.advised to avoid constipation, added Miralax BID 09/13/22 AM: again 2 large bloody BM ~ 500cc> got 1 unit PRBC   Subjective: Patient seen and examined this morning. Complains of soreness on left current left ankle He finished colon prep and stool has cleared up Hb holding in 7s gm. Patient has been under ICU status, attending changed to PCCM today  Assessment and Plan:  Septic shock with MRSA bacteremia-Initially admitted to ICU on vasopressors>shock resolved Left Groin wound infection w/ MRSA left Groin hematoma with a left common femoral artery pseudoaneurysm Lt groin bleeding 4/3 AM and CTA> arterial thrombus, new distal left EIA pseudoaneurysm (see report) S/P Urgent OR 09/09/22 and underwent bypass and stenting->intraoperative culture with MRSA: S/p OR 4/3 w/  left common iliac to profunda femoris bypass/ Lt common iliac artery angioplasty and stenting>OR culture 4/3 MRSA and E Fecium> ID following on daptomycin. Has peroneal signal in the foot, but will not be enough for healing, not a revascularization candidate and patient aware likely limb loss down the road  per VVS . 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   PVD with history of left external iliac artery and common artery stenting and right femoropopliteal bypass with left common femoral artery pseudoaneurysm on CT  3/22, status post left femoral artery pseudoaneurysm  repair 3/22, S/P AVG thrombectomy attempted but patient became hemodynamically unstable and procedure was aborted, followed by vascular  s/p OR with surgery as above on 09/09/22.see above.  Retroperitoneal hemorrhage on CTA 3/22. CTA angio 09/13/22 stable retroperitoneal hematoma.  Acute respiratory failure with hypoxemia.   COPD: Stable, cont supplemental oxygen, IS, prn inhalers.  Chronic CHF with preserved EF-based on echo from 4/1 Pulmonary hypertension: TEE 4/1 showed EF 50-55%,severe LVH, no left atrial thrombus detected.fluid being addressed with dialysis.  Chest x-ray showed some congestion, he is for HD today. Net IO Since Admission: 10,346.57 mL [09/15/22 0747]   ESRD on HD ZOX:[id number] following cont HD as per schedule. AVG clotted on 3/27:s/p temp catheter removed 3/28 IR placed new TDC on 4/1.  Hyponatremia/hypokalemia/hyperkalemia/ metabolic acidosis: Electrolytes stable monitor  Recent Labs  Lab 09/11/22 0441 09/11/22 1545 09/12/22 0255 09/12/22 1528 09/13/22 0303 09/13/22 0304 09/13/22 1606 09/14/22 0720 09/15/22 0231  K 3.6   < > 3.9   < > 3.9 3.9 3.9 4.4 4.9  CALCIUM 7.8*   < > 7.8*   < > 7.7* 7.8* 8.0* 8.3* 7.7*  MG  --   --   --   --   --   --   --  2.1  --   PHOS 5.0*  --  7.6*  --   --  5.5*  --  7.6* 8.8*   < > = values in this interval not displayed.     Acute blood loss anemia Anemia of chronic kidney disease: Acute blood loss anemia multifactorial  from operative loss(600 cc of blood loss during surgery), retroperitoneal bleed pseudoaneurysm/left groin bleeding/with wound VAC w/ drain in place and rectal bleedING.  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  Continue aspirin but being held due to rectal bleeding, continue statin, appreciate neuroinput.   HLD: on crestor  History of hypertension with transient hypotension on 3/30 was managed with IV fluid bolus,not needing antihypertensives now. Cont to monitor.   Hypoglycemia up to 61 4/8 am> resolved.  Monitor  Recent Labs  Lab 09/14/22 1151 09/14/22 1639 09/14/22 2047 09/15/22 0103 09/15/22 0340  GLUCAP 114* 120* 114* 101* 88   Lung nodule largest 1.1 cm without significant change since 3/30 finding likely infectious/inflammatory, recommended CT in 6 weeks 2.4 cm well-defined pancreatic tail mass unchanged since 05/05/2021 needs CT scan in 1 year  Poor oral intake: Dietitian following Moderate protein calorie malnutrition augment diet as tolerated Nutrition Problem: Moderate Malnutrition (in the context of chronic illness (ESRD)) Etiology: poor appetite Signs/Symptoms: moderate fat depletion, severe muscle depletion Interventions: Refer to RD note for recommendations, Liberalize Diet, Nepro shake, MVI  Pressure injury of skin on coccyx stage II POA, wound care   Goals of care: Currently full scope of treatment, prognosis remains to be seen at this time-continues to have ongoing significant medical conditions, left leg at risk of amputation.  Daughter updated at the bedside again and aware about overall serious medical status At risk of  decompensation, pulmonary critical care was consulted 4/8 monitor closely.  DVT prophylaxis: SCD's Start: 09/09/22 1841 Code Status:   Code Status: Full Code Family Communication: plan of care discussed with patient at bedside.Patient status is: Patient because of left leg ischemia Level of care: ICU  Dispo: The patient is from: home  lives with wife            Anticipated disposition:TBD Objective: Vitals last 24 hrs: Vitals:   09/15/22 0500 09/15/22 0503 09/15/22 0643 09/15/22 0700  BP:  (!) 146/58 (!) 154/65 (!) 152/74  Pulse:  73  71  Resp:  14 19 14   Temp:      TempSrc:      SpO2:  100%  100%  Weight: 74.5 kg     Height:       Weight change:   Physical Examination: General exam: AA O X.3, weak,older appearing HEENT:Oral mucosa moist, Ear/Nose WNL grossly, dentition normal. Respiratory system: bilaterally clear BS, no use of accessory muscle Cardiovascular system: S1 & S2 +, regular rate, JVD neg. Gastrointestinal system: Abdomen soft, NT,ND,BS+ Nervous System:Alert, awake, unable to left lower leg, Extremities: Left groin with wound VAC in place  Skin: No rashes,no icterus. MSK: Normal muscle bulk,tone, power  HD catheter+  Medications reviewed:  Scheduled Meds:  (feeding supplement) PROSource Plus  30 mL Oral TID BM   sodium chloride   Intravenous Once   acetaminophen  650 mg Oral Q6H   acetaminophen  650 mg Oral Once   aspirin EC  81 mg Oral Q0600   calcitRIOL  0.25 mcg Oral Q T,Th,Sa-HD   calcium acetate  1,334 mg Oral TID WC   Chlorhexidine Gluconate Cloth  6 each Topical Q0600   darbepoetin (ARANESP) injection - DIALYSIS  100 mcg Subcutaneous Q Wed-1800   feeding supplement  1 Container Oral TID WC   HYDROmorphone   Intravenous Q4H   [START ON 09/17/2022] leptospermum manuka honey  1 Application Topical Daily   liver oil-zinc oxide   Topical TID   methocarbamol  500 mg Oral TID   multivitamin  1 tablet Oral QHS   pantoprazole  40 mg Oral Daily   polyethylene glycol  17 g Oral BID   rosuvastatin  10 mg Oral Daily   sodium chloride flush  10-40 mL Intracatheter Q12H   sodium hypochlorite   Irrigation BID  Continuous Infusions:  sodium chloride Stopped (09/08/22 0904)   sodium chloride 10 mL/hr at 09/12/22 1600   sodium chloride     sodium chloride 20 mL/hr at 09/15/22 0700    DAPTOmycin (CUBICIN) 600 mg in sodium chloride 0.9 % IVPB     [START ON 09/19/2022] DAPTOmycin (CUBICIN) 750 mg in sodium chloride 0.9 % IVPB     magnesium sulfate bolus IVPB     Diet Order             Diet NPO time specified  Diet effective now                   Intake/Output Summary (Last 24 hours) at 09/15/2022 0747 Last data filed at 09/15/2022 0700 Gross per 24 hour  Intake 2026.69 ml  Output 50 ml  Net 1976.69 ml    Net IO Since Admission: 10,346.57 mL [09/15/22 0747]  Wt Readings from Last 3 Encounters:  09/15/22 74.5 kg  08/23/22 64.1 kg  08/15/22 64.9 kg     Unresulted Labs (From admission, onward)     Start     Ordered  09/15/22 0742  Prepare RBC (crossmatch)  (Blood Administration Adult)  Once,   R       Question Answer Comment  # of Units 1 unit   Transfusion Indications Hemoglobin 8 gm/dL or less and orthopedic or cardiac surgery or pre-existing cardiac condition   Number of Units to Keep Ahead NO units ahead   If emergent release call blood bank Not emergent release      09/15/22 0742   09/14/22 1100  Hemoglobin and hematocrit, blood  Now then every 6 hours,   R (with TIMED occurrences)     Question:  Specimen collection method  Answer:  Lab=Lab collect   09/14/22 1042   09/11/22 1442  Vitamin C  Once,   R       Question:  Specimen collection method  Answer:  Lab=Lab collect   09/11/22 1441   09/11/22 1442  Vitamin A  Once,   R       Question:  Specimen collection method  Answer:  Lab=Lab collect   09/11/22 1441   09/11/22 1442  Zinc  Once,   R       Question:  Specimen collection method  Answer:  Lab=Lab collect   09/11/22 1441   08/29/22 0500  Renal function panel (daily at 0500)  Daily,   R      08/28/22 2020          Data Reviewed: I have personally reviewed following labs and imaging studies CBC: Recent Labs  Lab 09/12/22 0225 09/12/22 0359 09/12/22 1528 09/12/22 2138 09/13/22 0303 09/13/22 1104 09/13/22 1606 09/13/22 1959  09/13/22 2326 09/14/22 0720 09/14/22 1126 09/14/22 1745 09/15/22 0231  WBC 15.7*  --  17.4*  --  19.3*  --  16.9*  --   --  20.1*  --   --   --   HGB 7.0*   < > 9.1*   < > 8.5*   < > 7.2*   < > 9.2* 8.1* 7.1* 7.3* 7.1*  HCT 21.0*   < > 27.4*   < > 25.4*   < > 22.0*   < > 27.0* 25.4* 21.6* 22.5* 21.8*  MCV 93.3  --  91.9  --  92.7  --  94.4  --   --  95.8  --   --   --   PLT 173  --  170  --  174  --  173  --   --  213  --   --   --    < > = values in this interval not displayed.    Basic Metabolic Panel: Recent Labs  Lab 09/11/22 0441 09/11/22 1545 09/12/22 0255 09/12/22 1528 09/13/22 0303 09/13/22 0304 09/13/22 1606 09/14/22 0720 09/15/22 0231  NA 136   < > 134*   < > 134* 133* 134* 134* 136  K 3.6   < > 3.9   < > 3.9 3.9 3.9 4.4 4.9  CL 97*   < > 98   < > 100 100 95* 96* 102  CO2 23   < > 23   < > 25 24 26  21* 19*  GLUCOSE 89   < > 103*   < > 88 86 94 93 92  BUN 37*   < > 64*   < > 33* 34* 42* 47* 59*  CREATININE 4.04*   < > 5.48*   < > 3.71* 3.68* 4.51* 5.23* 6.18*  CALCIUM 7.8*   < > 7.8*   < >  7.7* 7.8* 8.0* 8.3* 7.7*  MG  --   --   --   --   --   --   --  2.1  --   PHOS 5.0*  --  7.6*  --   --  5.5*  --  7.6* 8.8*   < > = values in this interval not displayed.    GFR: Estimated Creatinine Clearance: 11.1 mL/min (A) (by C-G formula based on SCr of 6.18 mg/dL (H)). Liver Function Tests: Recent Labs  Lab 09/10/22 0627 09/11/22 0441 09/12/22 0255 09/13/22 0304 09/15/22 0231  ALBUMIN 2.2* 1.9* 1.7* 1.8* 1.7*   CBG: Recent Labs  Lab 09/14/22 1151 09/14/22 1639 09/14/22 2047 09/15/22 0103 09/15/22 0340  GLUCAP 114* 120* 114* 101* 88    No results for input(s): "PROCALCITON", "LATICACIDVEN" in the last 168 hours.   Recent Results (from the past 240 hour(s))  Surgical pcr screen     Status: None   Collection Time: 09/09/22 11:16 AM   Specimen: Nasal Mucosa; Nasal Swab  Result Value Ref Range Status   MRSA, PCR NEGATIVE NEGATIVE Final    Staphylococcus aureus NEGATIVE NEGATIVE Final    Comment: (NOTE) The Xpert SA Assay (FDA approved for NASAL specimens in patients 69 years of age and older), is one component of a comprehensive surveillance program. It is not intended to diagnose infection nor to guide or monitor treatment. Performed at [facility], [address] [place], [place] [address]   Aerobic/Anaerobic Culture w Gram Stain (surgical/deep wound)     Status: None   Collection Time: 09/09/22  1:28 PM   Specimen: Groin, Left; Wound  Result Value Ref Range Status   Specimen Description GROIN LEFT  Final   Special Requests LEFT GROIN HEMATOMA PT ON VANC ANCEF  Final   Gram Stain   Final    ABUNDANT WBC PRESENT, PREDOMINANTLY PMN RARE GRAM POSITIVE COCCI IN PAIRS    Culture   Final    RARE METHICILLIN RESISTANT STAPHYLOCOCCUS AUREUS FEW ENTEROCOCCUS FAECIUM VANCOMYCIN RESISTANT ENTEROCOCCUS ISOLATED NO ANAEROBES ISOLATED Performed at [facility], [address] [place], [place] [address]    Report Status 09/14/2022 FINAL  Final   Organism ID, Bacteria METHICILLIN RESISTANT STAPHYLOCOCCUS AUREUS  Final   Organism ID, Bacteria ENTEROCOCCUS FAECIUM  Final      Susceptibility   Enterococcus faecium - MIC*    AMPICILLIN >=32 RESISTANT Resistant     VANCOMYCIN >=32 RESISTANT Resistant     GENTAMICIN SYNERGY SENSITIVE Sensitive     * FEW ENTEROCOCCUS FAECIUM   Methicillin resistant staphylococcus aureus - MIC*    CIPROFLOXACIN 1 SENSITIVE Sensitive     ERYTHROMYCIN >=8 RESISTANT Resistant     GENTAMICIN <=0.5 SENSITIVE Sensitive     OXACILLIN >=4 RESISTANT Resistant     TETRACYCLINE <=1 SENSITIVE Sensitive     VANCOMYCIN 1 SENSITIVE Sensitive     TRIMETH/SULFA <=10 SENSITIVE Sensitive     CLINDAMYCIN <=0.25 SENSITIVE Sensitive     RIFAMPIN <=0.5 SENSITIVE Sensitive     Inducible Clindamycin NEGATIVE Sensitive     * RARE METHICILLIN RESISTANT STAPHYLOCOCCUS AUREUS  Aerobic/Anaerobic Culture w  Gram Stain (surgical/deep wound)     Status: None   Collection Time: 09/09/22  1:28 PM   Specimen: Groin, Left; Wound  Result Value Ref Range Status   Specimen Description TISSUE LEFT GROIN  Final   Special Requests PT ON VANC ANCEF LEFT HEMATOMA  Final   Gram Stain  Final    ABUNDANT WBC PRESENT, PREDOMINANTLY PMN RARE GRAM POSITIVE COCCI IN PAIRS    Culture   Final    RARE METHICILLIN RESISTANT STAPHYLOCOCCUS AUREUS RARE ENTEROCOCCUS FAECIUM VANCOMYCIN RESISTANT ENTEROCOCCUS ISOLATED NO ANAEROBES ISOLATED Performed at [facility], [address], [place], [place] [address]    Report Status 09/14/2022 FINAL  Final   Organism ID, Bacteria METHICILLIN RESISTANT STAPHYLOCOCCUS AUREUS  Final   Organism ID, Bacteria ENTEROCOCCUS FAECIUM  Final      Susceptibility   Enterococcus faecium - MIC*    AMPICILLIN >=32 RESISTANT Resistant     VANCOMYCIN >=32 RESISTANT Resistant     GENTAMICIN SYNERGY SENSITIVE Sensitive     * RARE ENTEROCOCCUS FAECIUM   Methicillin resistant staphylococcus aureus - MIC*    CIPROFLOXACIN 1 SENSITIVE Sensitive     ERYTHROMYCIN >=8 RESISTANT Resistant     GENTAMICIN <=0.5 SENSITIVE Sensitive     OXACILLIN >=4 RESISTANT Resistant     TETRACYCLINE <=1 SENSITIVE Sensitive     VANCOMYCIN <=0.5 SENSITIVE Sensitive     TRIMETH/SULFA <=10 SENSITIVE Sensitive     CLINDAMYCIN <=0.25 SENSITIVE Sensitive     RIFAMPIN <=0.5 SENSITIVE Sensitive     Inducible Clindamycin NEGATIVE Sensitive     * RARE METHICILLIN RESISTANT STAPHYLOCOCCUS AUREUS  Aerobic/Anaerobic Culture w Gram Stain (surgical/deep wound)     Status: None   Collection Time: 09/09/22  2:48 PM   Specimen: PATH Other; Tissue  Result Value Ref Range Status   Specimen Description WOUND  Final   Special Requests LEFT COMMON ILIAC STENTS PT ON VANC ANCEF  Final   Gram Stain   Final    ABUNDANT WBC PRESENT,BOTH PMN AND MONONUCLEAR FEW GRAM POSITIVE COCCI IN PAIRS    Culture   Final    ABUNDANT  METHICILLIN RESISTANT STAPHYLOCOCCUS AUREUS NO ANAEROBES ISOLATED Performed at [facility], [address], [place], [place] [address]    Report Status 09/14/2022 FINAL  Final   Organism ID, Bacteria METHICILLIN RESISTANT STAPHYLOCOCCUS AUREUS  Final      Susceptibility   Methicillin resistant staphylococcus aureus - MIC*    CIPROFLOXACIN 1 SENSITIVE Sensitive     ERYTHROMYCIN >=8 RESISTANT Resistant     GENTAMICIN <=0.5 SENSITIVE Sensitive     OXACILLIN >=4 RESISTANT Resistant     TETRACYCLINE <=1 SENSITIVE Sensitive     VANCOMYCIN 1 SENSITIVE Sensitive     TRIMETH/SULFA <=10 SENSITIVE Sensitive     CLINDAMYCIN <=0.25 SENSITIVE Sensitive     RIFAMPIN <=0.5 SENSITIVE Sensitive     Inducible Clindamycin NEGATIVE Sensitive     * ABUNDANT METHICILLIN RESISTANT STAPHYLOCOCCUS AUREUS  Aerobic/Anaerobic Culture w Gram Stain (surgical/deep wound)     Status: None   Collection Time: 09/09/22  2:53 PM   Specimen: PATH Vessel; Tissue  Result Value Ref Range Status   Specimen Description TISSUE  Final   Special Requests VESSEL PT ON VANC ANCEF  Final   Gram Stain NO WBC SEEN RARE GRAM POSITIVE COCCI   Final   Culture   Final    RARE STAPHYLOCOCCUS AUREUS SUSCEPTIBILITIES PERFORMED ON PREVIOUS CULTURE WITHIN THE LAST 5 DAYS. NO ANAEROBES ISOLATED Performed at [facility], [address], [place], [place] [address]    Report Status 09/14/2022 FINAL  Final    Antimicrobials: Anti-infectives (From admission, onward)    Start     Dose/Rate Route Frequency Ordered Stop   09/19/22 1800  DAPTOmycin (CUBICIN) 750 mg in sodium chloride  0.9 % IVPB        10 mg/kg  75.2 kg 130 mL/hr over 30 Minutes Intravenous Every Sat (1800) 09/14/22 1622     09/15/22 1800  DAPTOmycin (CUBICIN) 600 mg in sodium chloride 0.9 % IVPB        8 mg/kg  75.2 kg 124 mL/hr over 30 Minutes Intravenous Once per day on Tue Thu 09/14/22 1622     09/12/22 1800  DAPTOmycin (CUBICIN) 600 mg in sodium  chloride 0.9 % IVPB        600 mg 124 mL/hr over 30 Minutes Intravenous  Once 09/12/22 1049 09/12/22 2033   09/11/22 1615  DAPTOmycin (CUBICIN) 600 mg in sodium chloride 0.9 % IVPB        600 mg 124 mL/hr over 30 Minutes Intravenous  Once 09/11/22 1522 09/11/22 2042   09/10/22 1200  vancomycin (VANCOREADY) IVPB 500 mg/100 mL  Status:  Discontinued        500 mg 100 mL/hr over 60 Minutes Intravenous Every T-Th-Sa (Hemodialysis) 09/09/22 0719 09/10/22 0719   09/10/22 0815  vancomycin (VANCOREADY) IVPB 750 mg/150 mL        750 mg 150 mL/hr over 60 Minutes Intravenous  Once 09/10/22 0719 09/10/22 0939   09/10/22 0718  vancomycin variable dose per unstable renal function (pharmacist dosing)  Status:  Discontinued         Does not apply See admin instructions 09/10/22 0719 09/11/22 1522   09/09/22 2000  piperacillin-tazobactam (ZOSYN) IVPB 2.25 g  Status:  Discontinued        2.25 g 100 mL/hr over 30 Minutes Intravenous Every 8 hours 09/09/22 1849 09/10/22 1003   09/09/22 1145  ceFAZolin (ANCEF) 2-4 GM/100ML-% IVPB       Note to Pharmacy: Payton Emerald A: cabinet override      09/09/22 1145 09/09/22 2359   09/08/22 0830  vancomycin (VANCOREADY) IVPB 750 mg/150 mL        750 mg 150 mL/hr over 60 Minutes Intravenous  Once 09/08/22 0743 09/08/22 1004   09/07/22 1200  ceFAZolin (ANCEF) IVPB 2g/100 mL premix       Note to Pharmacy: Give in IR for surgical prophylaxis   2 g 200 mL/hr over 30 Minutes Intravenous  Once 09/07/22 1100 09/07/22 1327   09/07/22 1101  ceFAZolin (ANCEF) 2-4 GM/100ML-% IVPB       Note to Pharmacy: Phebe Colla N: cabinet override      09/07/22 1101 09/07/22 1335   09/07/22 0734  vancomycin variable dose per unstable renal function (pharmacist dosing)  Status:  Discontinued         Does not apply See admin instructions 09/07/22 0734 09/09/22 0718   09/05/22 1600  vancomycin (VANCOREADY) IVPB 750 mg/150 mL        750 mg 150 mL/hr over 60 Minutes Intravenous  Once  09/05/22 1505 09/05/22 1717   09/02/22 1227  ceFAZolin (ANCEF) 2-4 GM/100ML-% IVPB       Note to Pharmacy: Isabel Caprice, Destiny: cabinet override      09/02/22 1227 09/03/22 0044   09/02/22 0520  vancomycin variable dose per unstable renal function (pharmacist dosing)  Status:  Discontinued         Does not apply See admin instructions 09/02/22 0521 09/05/22 0940   08/31/22 1800  vancomycin (VANCOREADY) IVPB 750 mg/150 mL  Status:  Discontinued        750 mg 150 mL/hr over 60 Minutes Intravenous Every 24 hours 08/31/22 0822 09/02/22 0521  08/30/22 1800  vancomycin (VANCOREADY) IVPB 1500 mg/300 mL        1,500 mg 150 mL/hr over 120 Minutes Intravenous STAT 08/30/22 1739 08/30/22 2139      Culture/Microbiology    Component Value Date/Time   SDES TISSUE 09/09/2022 1453   SPECREQUEST VESSEL PT ON VANC ANCEF 09/09/2022 1453   CULT  09/09/2022 1453    RARE STAPHYLOCOCCUS AUREUS SUSCEPTIBILITIES PERFORMED ON PREVIOUS CULTURE WITHIN THE LAST 5 DAYS. NO ANAEROBES ISOLATED Performed at [facility], [address] [place], [place] [address]    REPTSTATUS 09/14/2022 FINAL 09/09/2022 1453  Other culture-see note  Radiology Studies: DG Chest Port 1 View  Result Date: 09/13/2022 CLINICAL DATA:  Fluid overload. EXAM: PORTABLE CHEST 1 VIEW COMPARISON:  08/28/2022 FINDINGS: Stable enlarged cardiac silhouette, mildly prominent upper lung zone pulmonary vasculature and mild chronic interstitial prominence. No Kerley lines, airspace consolidation or pleural fluid. Right jugular catheter tip in the inferior aspect of the superior vena cava near the superior cavoatrial junction. No pneumothorax. Unremarkable bones. IMPRESSION: 1. No acute abnormality. 2. Stable cardiomegaly, mild pulmonary vascular congestion and mild chronic interstitial lung disease. Electronically Signed   By: Beckie Salts M.D.   On: 09/13/2022 13:02   CT ANGIO GI BLEED  Result Date: 09/13/2022 CLINICAL DATA:  GI bleed. EXAM: CTA  ABDOMEN AND PELVIS WITHOUT AND WITH CONTRAST TECHNIQUE: Multidetector CT imaging of the abdomen and pelvis was performed using the standard protocol during bolus administration of intravenous contrast. Multiplanar reconstructed images and MIPs were obtained and reviewed to evaluate the vascular anatomy. RADIATION DOSE REDUCTION: This exam was performed according to the departmental dose-optimization program which includes automated exposure control, adjustment of the mA and/or kV according to patient size and/or use of iterative reconstruction technique. CONTRAST:  OMNIPAQUE IOHEXOL 350 MG/ML SOLN COMPARISON:  09/09/2022 and 09/05/2022 as well as 08/12/2022 and 08/28/2022. FINDINGS: VASCULAR Aorta: Extensive stable 3-4 mm penetrating ulcer along the left posterolateral wall of the infrarenal segment. Atherosclerotic plaque throughout the abdominal aorta which is normal in caliber. Celiac: Calcified plaque at the origin and throughout. Short segment moderate stenosis near the origin. Otherwise patent. SMA: Significant calcified plaque at the origin calcified and noncalcified plaque proximally causing moderate short segment focal narrowing. SMA is otherwise patent distally. 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 Mild-to-moderate calcified plaque throughout both renal arteries. Right renal artery is patent. IMA: Calcified plaque proximally but otherwise patent. Inflow: Right common and external iliac artery stent present which is patent. Left common iliac artery stent patent. Left external iliac artery is patent. 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 Proximal Outflow: Prominent, but patent right common femoral artery with patent bifurcation. Surgical changes adjacent the common femoral artery. There is mild prominence of the left common femoral artery which is otherwise patent. Patent from the  femoral artery and collaterals. Immediate occlusion of the left superficial femoral artery unchanged. Catheter extending anteriorly from the left inguinal region running adjacent the common femoral/external iliac vessels and then along the anterior border of the left iliopsoas muscle. Veins: No obvious venous abnormality within the limitations of this arterial phase study. Review of the MIP images confirms the above findings. NON-VASCULAR Lower chest: Mild stable cardiomegaly. Calcified plaque over the right coronary artery and descending thoracic aorta. Persistent nodular airspace process over the lung bases with largest nodule in the left base measuring 1.1 cm without significant change.  Largest nodule over the right base measures 1.1 cm which is slightly smaller. A few other smaller subcentimeter nodules bilaterally unchanged. This nodule airspace process is new since 08/28/2022, but not significantly changed since 09/05/2022. Mild posterior bibasilar dependent atelectasis. No effusion. Hepatobiliary: Liver, gallbladder and biliary tree are normal. Pancreas: Stable homogeneous well-defined oval mass overlying the region of the pancreatic tail measuring approximately 2.4 cm. Spleen: Normal. Adrenals/Urinary Tract: Adrenal glands are normal. Kidneys slightly small with cortical thinning bilaterally. Decreased perfusion of the left kidney compared to the right. No hydronephrosis or focal mass. Bladder is somewhat contracted. Visualized ureters unremarkable. Stomach/Bowel: Stomach and small bowel are normal. Moderate diverticulosis of the colon most notable over the distal descending and sigmoid colon. Appendix is normal. No definite evidence of GI bleed. Lymphatic: No adenopathy. Reproductive: Normal. Other: Stable retroperitoneal/prevesical space hematoma extending superiorly medially deep to the rectus abdominus muscles. This measures proximally 6 x 11.1 cm. Hematoma also extends along the left iliopsoas muscle  without significant change. Musculoskeletal: No focal abnormality. IMPRESSION: VASCULAR: 1. No definite evidence of GI bleed. 2. 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 3. Atherosclerotic plaque throughout the abdominal aorta and branch vessels as described. Short segment moderate stenosis of the proximal celiac artery and SMA. 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 Decreased perfusion of the left kidney compared to the right. 4. Stable retroperitoneal/prevesical space hematoma extending superiorly medially deep to the rectus abdominus muscles with largest measurement 6 x 11.1 cm. Hematoma also extends along the left iliopsoas muscle without significant change. 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 5. 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 NONVASCULAR: 1. No acute findings in the abdomen or pelvis. 2. Persistent nodular airspace process over the lung bases with largest nodule in the left base measuring 1.1 cm without significant change since 09/05/2022. Largest nodule over the right base measures 1.1 cm which is slightly smaller. A few other smaller subcentimeter nodules bilaterally unchanged. Findings likely infectious/inflammatory in nature. Recommend follow-up CT 6 weeks. 3. 2.4 cm homogeneous well-defined oval mass overlying the region of the pancreatic tail without significant change from 05/05/2021 possibly a splenule. Consider follow-up CT 1 year. 4. Colonic diverticulosis without evidence of acute diverticulitis. 5. Aortic atherosclerosis. Atherosclerotic coronary artery disease. 6. Stable cardiomegaly. Aortic Atherosclerosis (ICD10-I70.0). Electronically Signed   By: Elberta Fortis M.D.   On: 09/13/2022 10:13     LOS: 18 days   Lanae Boast, MD [facility]  09/15/2022, 7:47 AM

## 2022-09-15 NOTE — Progress Notes (Signed)
PHARMACY CONSULT NOTE FOR:  OUTPATIENT  PARENTERAL ANTIBIOTIC THERAPY (OPAT)  Informational only as plan is to receive antibiotics at the hemodialysis center outpatient  Indication: VRE + MRSA groin/graft infection Regimen: Daptomycin 600 mg post HD-Tues/Thurs and 750 mg post HD-Saturdays End date: 10/23/22  IV antibiotic discharge orders are pended. To discharging provider:  please sign these orders via discharge navigator,  Select New Orders & click on the button choice - Manage This Unsigned Work.     Thank you for allowing pharmacy to be a part of this patient's care.  Georgina Pillion, PharmD, BCPS Infectious Diseases Clinical Pharmacist 09/15/2022 1:57 PM   **Pharmacist phone directory can now be found on [email or web address] ([email or web address]).  Listed under [facility].

## 2022-09-15 NOTE — Op Note (Signed)
[facility] Patient Name: Albert Harris Procedure Date : 09/15/2022 MRN: [id number] Attending MD: Dub Amis. Tomasa Rand , MD, [id number] Date of Birth: 1956-04-21 CSN: [id number] Age: 67 Admit Type: Inpatient Procedure:                Colonoscopy Indications:              Hematochezia Providers:                Lorin Picket E. Tomasa Rand, MD, Jasmine Pang, RN,                            Sunday Corn Mbumina, Technician Referring MD:              Medicines:                Monitored Anesthesia Care Complications:            No immediate complications. Estimated Blood Loss:     Estimated blood loss was minimal. Procedure:                Pre-Anesthesia Assessment:                           - Prior to the procedure, a History and Physical                            was performed, and patient medications and                            allergies were reviewed. [name] tolerance of                            previous anesthesia was also reviewed. The risks                            and benefits of the procedure and the sedation                            options and risks were discussed with the patient.                            All questions were answered, and informed consent                            was obtained. Prior Anticoagulants: The patient has                            taken Plavix (clopidogrel). ASA Grade Assessment:                            III - A patient with severe systemic disease. After                            reviewing the risks and benefits, the patient was  deemed in satisfactory condition to undergo the                            procedure.                           After obtaining informed consent, the colonoscope                            was passed under direct vision. Throughout the                            procedure, the patient's blood pressure, pulse, and                            oxygen saturations were  monitored continuously. The                            CF-HQ190L ([id number]) Olympus coloscope was                            introduced through the anus and advanced to the the                            terminal ileum, with identification of the                            appendiceal orifice and IC valve. The colonoscopy                            was performed without difficulty. The patient                            tolerated the procedure well. The quality of the                            bowel preparation was good. The terminal ileum,                            ileocecal valve, appendiceal orifice, and rectum                            were photographed. The bowel preparation used was                            MoviPrep via split dose instruction. Scope In: 9:22:46 AM Scope Out: 9:54:33 AM Scope Withdrawal Time: 0 hours 19 minutes 48 seconds  Total Procedure Duration: 0 hours 31 minutes 47 seconds  Findings:      Hemorrhoids were found on perianal exam.      The digital rectal exam findings include mucosal irregularity. Pertinent       negatives include normal sphincter tone.      Two large ulcers were found in the distal rectum, estimated 2-3 cm. No       bleeding was present, but there was  a visible vessel present at the edge       of one of the ulcers. For hemostasis, one hemostatic clip was       successfully placed (MR conditional) across the vessel (the first       attempt at clip placement was unsuccessful. There was no bleeding during       the procedure. Biopsies were taken with a cold forceps for histology at       the ulcer's edge. Estimated blood loss was minimal.      A few ulcers were found in the ascending colon, in the cecum and at the       ileocecal valve. No bleeding was present. No stigmata of recent bleeding       were seen. Biopsies were taken with a cold forceps for histology.       Estimated blood loss was minimal.      Multiple small-mouthed diverticula  were found in the sigmoid colon and       descending colon.      A localized area of mildly inflamed mucosa was found in the sigmoid       colon. Biopsies were taken with a cold forceps for histology. Estimated       blood loss was minimal.      A 2 mm polyp was found in the distal transverse colon. The polyp was       sessile. The polyp was removed with a cold biopsy forceps. Resection and       retrieval were complete. Estimated blood loss was minimal.      The exam was otherwise normal throughout the examined colon.      The terminal ileum appeared normal.      Non-bleeding internal hemorrhoids were found during retroflexion. The       hemorrhoids were Grade I (internal hemorrhoids that do not prolapse).      No additional abnormalities were found on retroflexion. Impression:               - Hemorrhoids found on perianal exam.                           - Mucosal irregularity found on digital rectal                            exam, corresponding to ulcers seen on endoscopy.                           - Two large ulcers in the distal rectum, one with a                            visible vessel. Clip (MR conditional) was placed.                            Biopsied. Suspect this is the source of the                            patient's hematochezia.                           - A few ulcers in the ascending colon, in the cecum  and at the ileocecal valve. Biopsied.                           - Diverticulosis in the sigmoid colon and in the                            descending colon.                           - Inflamed mucosa in the sigmoid colon. Biopsied.                           - One 2 mm polyp in the distal transverse colon,                            removed with a cold biopsy forceps. Resected and                            retrieved.                           - The examined portion of the ileum was normal.                           - Non-bleeding internal  hemorrhoids.                           - Although the rectal ulcers were previously                            attributed to constipation (stercoral ulcers), the                            presence of ulcers in the right colon raises                            possibility of Crohn's disease. Other possibilities                            include infectious/viral or less likely ischemic. Moderate Sedation:      N/A Recommendation:           - Return patient to hospital ward for ongoing care.                           - Resume previous diet.                           - Obtain IBD serology panel, baseline CRP and fecal                            calprotectin.                           - Await biopsy results. Procedure Code(s):        --- Professional ---  [id number], 59, Colonoscopy, flexible; with control of                            bleeding, any method                           45380, Colonoscopy, flexible; with biopsy, single                            or multiple Diagnosis Code(s):        --- Professional ---                           K64.0, First degree hemorrhoids                           K62.6, Ulcer of anus and rectum                           K63.3, Ulcer of intestine                           K52.9, Noninfective gastroenteritis and colitis,                            unspecified                           D12.3, Benign neoplasm of transverse colon (hepatic                            flexure or splenic flexure)                           K92.1, Melena (includes Hematochezia)                           K57.30, Diverticulosis of large intestine without                            perforation or abscess without bleeding CPT copyright 2022 American [employer] Association. All rights reserved. The codes documented in this report are preliminary and upon coder review may  be revised to meet current compliance requirements. Delisia Mcquiston E. Tomasa Randunningham, MD 09/15/2022 10:19:15  AM This report has been signed electronically. Number of Addenda: 0

## 2022-09-15 NOTE — Transfer of Care (Signed)
Immediate Anesthesia Transfer of Care Note  Patient: Albert Harris  Procedure(s) Performed: COLONOSCOPY BIOPSY POLYPECTOMY HEMOSTASIS CLIP PLACEMENT  Patient Location: Endoscopy Unit  Anesthesia Type:MAC  Level of Consciousness: awake, patient cooperative, and responds to stimulation  Airway & Oxygen Therapy: Patient Spontanous Breathing and Patient connected to nasal cannula oxygen  Post-op Assessment: Report given to RN and Post -op Vital signs reviewed and stable  Post vital signs: Reviewed and stable  Last Vitals:  Vitals Value Taken Time  BP    Temp    Pulse    Resp    SpO2      Last Pain:  Vitals:   09/15/22 0836  TempSrc: Temporal  PainSc: 7       Patients Stated Pain Goal: 0 (09/14/22 0400)  Complications: No notable events documented.

## 2022-09-15 NOTE — Progress Notes (Signed)
[facility] Progress Note  Subjective:  CSY this AM, ulcers throughout colon; clipped large ulcer in distal rectum For HD today  Vitals:   09/15/22 1037 09/15/22 1100 09/15/22 1110 09/15/22 1130  BP: (!) 161/78 (!) 142/46 (!) 142/46   Pulse:   73 71  Resp:  12 16 (!) 21  Temp:   97.8 F (36.6 C)   TempSrc:   Oral   SpO2:  100% 100% 100%  Weight:      Height:        Exam: Constitutional: lying in bed, alert  CV: normal rate, no edema Respiratory: bilateral chest rise, no iwob Gastrointestinal: soft, nonended Psych: mood and affect appropriate  Access: Right upper extremity AVG, no bruit or thrill  L fem wound covered w/ dressing   L foot a bit discolored    OP HD: TTS SW  4h   350/800  64.5kg  RFA AVG= clotted/ new TDC in place   Heparin none  - last OP HD 3/14, post wt 65.9kg - venofer 50mg  IV weekly - rocaltrol 0.25 mcg po tiw - phoslo 2 ac tid - no esa, last Hb 11.0  Summary: presented 08/21/22 w/ acute pain/ ischemic L foot. Hx of L EIA stenting on 3/04, and prior RLE bypass. Pt underwent endarterectomy of the left common femoral, profundofemoral, superficial femoral and external iliac vessels with bovine pericardial patch angioplasty and a left iliofemoral and profundofemoral thromboembolectomy on 3/15. Was in ICU until 3/18 for hypotension. DC'd home on 3/18. Pt then returned on 3/22 to ED w/ N/V and unable to feel his L foot. BP's low in ED and was found to have L fem pseudoaneurysm. Had emergent CRRT for high K+. Then taken to OR 3/23 for primary repair of the distal EIA w/ hematoma evacuation. Extubated 3/24. BCx's from  admit were + for MRSA, IV vanc was started. On 3/26 was off CRRT and off pressors. R arm AVG was clotted and went for declot on 3/27 per VVS but graft re-clotted. Pt had regular HD on 3/28 then temp cath was removed for line holiday. IR placed new TDC on 4/01 and pt had regular HD later the same day. Otherwise as below.    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 Went back to OR 4/3 for L CIA to profunda bypass, L CIA pta and stenting, and muscle flap (sartorious to groin). Still high risk of limb loss.   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 ID has since signed off.  CVA: +stroke on MRI. Neuro signed off.  ESRD: TTS HD.  SP CRRT 3/23- 3/26. Had HD 3/28, then the temp cath was removed for line holiday. On 4/1 VVS placed TDC and is now back on TTS schedule. Next HD today 4/09.  Volume - min edema LE's, 2L O2, soft BP's, doubt sig vol excess Anemia: Hb 7s from ongoing GI losses.  S/p CSY 09/15/22.  Inc ESA to higher dose.  Transfuse prn.  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 Cont po vdra and phoslo 2 ac tid as binder.  Dialysis access: AVG clotted 3/27, went for declot by VVS and then re-clotted. Temp cath removed 3/28. VVS placed new RIJ TDC on 4/1. Using [facility].  Arita Miss, MD  [facility] 09/15/2022, 11:55 AM  Recent Labs  Lab 09/13/22 0304 09/13/22 1104 09/14/22 0720 09/14/22 1126 09/15/22 0231 09/15/22 0841  HGB  --    < > 8.1*   < > 7.1* 7.4*  ALBUMIN 1.8*  --   --   --  1.7*  --   CALCIUM 7.8*   < > 8.3*  --  7.7*  --   PHOS 5.5*  --  7.6*  --  8.8*  --   CREATININE 3.68*   < > 5.23*  --  6.18*  --   K 3.9   < > 4.4  --  4.9  --    < > = values in this interval not displayed.    No results for input(s): "IRON", "TIBC", "FERRITIN" in the last 168 hours. Inpatient medications:  (feeding supplement) PROSource Plus  30 mL Oral TID BM   sodium chloride   Intravenous Once   acetaminophen  650 mg Oral Q6H   aspirin EC  81 mg Oral Q0600   calcitRIOL  0.25 mcg Oral Q T,Th,Sa-HD   calcium acetate  1,334 mg Oral TID WC   Chlorhexidine Gluconate Cloth  6 each Topical Q0600   darbepoetin (ARANESP) injection - DIALYSIS  100 mcg Subcutaneous Q  Wed-1800   feeding supplement  1 Container Oral TID WC   HYDROmorphone   Intravenous Q4H   [START ON 09/17/2022] leptospermum manuka honey  1 Application Topical Daily   liver oil-zinc oxide   Topical TID   methocarbamol  500 mg Oral TID   multivitamin  1 tablet Oral QHS   pantoprazole  40 mg Oral Daily   polyethylene glycol  17 g Oral BID   rosuvastatin  10 mg Oral Daily   sodium chloride flush  10-40 mL Intracatheter Q12H   sodium hypochlorite   Irrigation BID    sodium chloride Stopped (09/08/22 0904)   sodium chloride 10 mL/hr at 09/12/22 1600   sodium chloride     DAPTOmycin (CUBICIN) 600 mg in sodium chloride 0.9 % IVPB     [START ON 09/19/2022] DAPTOmycin (CUBICIN) 750 mg in sodium chloride 0.9 % IVPB     magnesium sulfate bolus IVPB     Place/Maintain arterial line **AND** sodium chloride, sodium chloride, alteplase, dicyclomine, diphenhydrAMINE **OR** diphenhydrAMINE, docusate sodium, heparin, hydrALAZINE, magnesium sulfate bolus IVPB, naloxone **AND** sodium chloride flush, ondansetron (ZOFRAN) IV, ondansetron (ZOFRAN) IV, mouth rinse, oxyCODONE, pentafluoroprop-tetrafluoroeth, sodium chloride flush

## 2022-09-15 NOTE — Progress Notes (Addendum)
  Progress Note    09/15/2022 7:24 AM 3 Days Post-Op  Subjective:  left groin sore   Vitals:   09/15/22 0643 09/15/22 0700  BP: (!) 154/65 (!) 152/74  Pulse:  71  Resp: 19 14  Temp:    SpO2:  100%   Physical Exam: Cardiac:  regular Lungs:  non labored Incision: Left groin with VAC with good seal Extremities:  Faint doppler Dp/PT signals Neurologic: alert and oriented  CBC    Component Value Date/Time   WBC 20.1 (H) 09/14/2022 0720   RBC 2.65 (L) 09/14/2022 0720   HGB 7.1 (L) 09/15/2022 0231   HGB 10.4 (L) 06/18/2021 1431   HCT 21.8 (L) 09/15/2022 0231   HCT 31.6 (L) 06/18/2021 1431   PLT 213 09/14/2022 0720   PLT 142 (L) 06/18/2021 1431   MCV 95.8 09/14/2022 0720   MCV 96 06/18/2021 1431   MCH 30.6 09/14/2022 0720   MCHC 31.9 09/14/2022 0720   RDW 18.3 (H) 09/14/2022 0720   RDW 14.3 06/18/2021 1431   LYMPHSABS 1.5 08/28/2022 1810   LYMPHSABS 1.5 06/18/2021 1431   MONOABS 0.7 08/28/2022 1810   EOSABS 0.1 08/28/2022 1810   EOSABS 1.0 (H) 06/18/2021 1431   BASOSABS 0.1 08/28/2022 1810   BASOSABS 0.1 06/18/2021 1431    BMET    Component Value Date/Time   NA 136 09/15/2022 0231   NA 141 06/18/2021 1431   K 4.9 09/15/2022 0231   CL 102 09/15/2022 0231   CO2 19 (L) 09/15/2022 0231   GLUCOSE 92 09/15/2022 0231   BUN 59 (H) 09/15/2022 0231   BUN 20 06/18/2021 1431   CREATININE 6.18 (H) 09/15/2022 0231   CREATININE 1.15 02/19/2017 1049   CALCIUM 7.7 (L) 09/15/2022 0231   CALCIUM 8.5 (L) 06/10/2020 0900   GFRNONAA 9 (L) 09/15/2022 0231   GFRAA 6 (L) 03/04/2020 1045    INR    Component Value Date/Time   INR 1.2 08/12/2022 1501     Intake/Output Summary (Last 24 hours) at 09/15/2022 0724 Last data filed at 09/15/2022 0762 Gross per 24 hour  Intake 2010 ml  Output 50 ml  Net 1960 ml     Assessment/Plan:  67 y.o. male is s/p s/p iliofemoral bypass, removal of infected patch with sartorius muscle flap  3 Days Post-Op   Left groin with VAC to suction.  Good seal VAC changed yesterday. Plan for next change on Thursday LLE perfusion unchanged/ Faint PT/Pero signal Last hgb 7.1. Transfuse per primary team <7 Continues to have bloody bowel movements. C - scope with GI today  HD later today Cx grew MRSA. Remains on IV Cubicin Will likely need left AKA at some point WOC RN caring for sacral decubitus Appreciate CCM and GI assistance   Dory Horn [facility] [phone] 09/15/2022 7:24 AM  VASCULAR STAFF ADDENDUM: I have independently interviewed and examined the patient. I agree with the above.   Rande Brunt. Lenell Antu, MD [facility] Phone Number: [phone] 09/15/2022 3:16 PM

## 2022-09-15 NOTE — Anesthesia Postprocedure Evaluation (Signed)
Anesthesia Post Note  Patient: Albert Harris  Procedure(s) Performed: COLONOSCOPY BIOPSY POLYPECTOMY HEMOSTASIS CLIP PLACEMENT     Patient location during evaluation: Endoscopy Anesthesia Type: MAC Level of consciousness: awake and alert Pain management: pain level controlled Vital Signs Assessment: post-procedure vital signs reviewed and stable Respiratory status: spontaneous breathing, nonlabored ventilation, respiratory function stable and patient connected to nasal cannula oxygen Cardiovascular status: blood pressure returned to baseline and stable Postop Assessment: no apparent nausea or vomiting Anesthetic complications: no  No notable events documented.  Last Vitals:  Vitals:   09/15/22 1240 09/15/22 1300  BP: 137/86 (!) 107/58  Pulse: 70 68  Resp: 16 12  Temp: 36.5 C   SpO2: 99% 93%    Last Pain:  Vitals:   09/15/22 1330  TempSrc:   PainSc: 5                  [name]

## 2022-09-15 NOTE — Progress Notes (Signed)
OT Cancellation Note  Patient Details Name: Albert Harris MRN: [id number] DOB: 19-May-1956   Cancelled Treatment:    Reason Eval/Treat Not Completed: Patient at procedure or test/ unavailable.  [name] D [name] 09/15/2022, 3:13 PM 09/15/2022  RP, OTR/L  Acute Rehabilitation Services  Office:  [phone]

## 2022-09-15 NOTE — Interval H&P Note (Signed)
History and Physical Interval Note:  09/15/2022 9:04 AM  Albert Harris  has presented today for surgery, with the diagnosis of Anemia, lower GI bleed.  The various methods of treatment have been discussed with the patient and family. After consideration of risks, benefits and other options for treatment, the patient has consented to  Procedure(s): COLONOSCOPY (N/A) as a surgical intervention.  [name] history has been reviewed, patient examined, no change in status, stable for surgery.  I have reviewed the patient's chart and labs.  Questions were answered to the patient's satisfaction.   He has continued to pass bloody stools through the bowel prep.  Hgb 7 after 1 unit yesterday (was 7 prior to transfusion).   Hemodynamically stable this morning.  Jenel Lucks

## 2022-09-16 DIAGNOSIS — K625 Hemorrhage of anus and rectum: Secondary | ICD-10-CM | POA: Diagnosis not present

## 2022-09-16 DIAGNOSIS — N186 End stage renal disease: Secondary | ICD-10-CM | POA: Diagnosis not present

## 2022-09-16 DIAGNOSIS — A419 Sepsis, unspecified organism: Secondary | ICD-10-CM | POA: Diagnosis not present

## 2022-09-16 DIAGNOSIS — Z992 Dependence on renal dialysis: Secondary | ICD-10-CM | POA: Diagnosis not present

## 2022-09-16 DIAGNOSIS — R6521 Severe sepsis with septic shock: Secondary | ICD-10-CM | POA: Diagnosis not present

## 2022-09-16 LAB — RENAL FUNCTION PANEL
Albumin: 1.8 g/dL — ABNORMAL LOW (ref 3.5–5.0)
Anion gap: 9 (ref 5–15)
BUN: 33 mg/dL — ABNORMAL HIGH (ref 8–23)
CO2: 26 mmol/L (ref 22–32)
Calcium: 7.8 mg/dL — ABNORMAL LOW (ref 8.9–10.3)
Chloride: 100 mmol/L (ref 98–111)
Creatinine, Ser: 4.46 mg/dL — ABNORMAL HIGH (ref 0.61–1.24)
GFR, Estimated: 14 mL/min — ABNORMAL LOW (ref >60)
Glucose, Bld: 92 mg/dL (ref 70–99)
Phosphorus: 5.3 mg/dL — ABNORMAL HIGH (ref 2.5–4.6)
Potassium: 3.6 mmol/L (ref 3.5–5.1)
Sodium: 135 mmol/L (ref 135–145)

## 2022-09-16 LAB — GLUCOSE, CAPILLARY: Glucose-Capillary: 95 mg/dL (ref 70–99)

## 2022-09-16 LAB — SURGICAL PATHOLOGY

## 2022-09-16 LAB — CBC
HCT: 24.6 % — ABNORMAL LOW (ref 39.0–52.0)
Hemoglobin: 8 g/dL — ABNORMAL LOW (ref 13.0–17.0)
MCH: 29.6 pg (ref 26.0–34.0)
MCHC: 32.5 g/dL (ref 30.0–36.0)
MCV: 91.1 fL (ref 80.0–100.0)
Platelets: 178 10*3/uL (ref 150–400)
RBC: 2.7 MIL/uL — ABNORMAL LOW (ref 4.22–5.81)
RDW: 17.9 % — ABNORMAL HIGH (ref 11.5–15.5)
WBC: 11.7 10*3/uL — ABNORMAL HIGH (ref 4.0–10.5)
nRBC: 0 % (ref 0.0–0.2)

## 2022-09-16 LAB — HEMOGLOBIN AND HEMATOCRIT, BLOOD
HCT: 23.3 % — ABNORMAL LOW (ref 39.0–52.0)
Hemoglobin: 7.8 g/dL — ABNORMAL LOW (ref 13.0–17.0)

## 2022-09-16 LAB — TYPE AND SCREEN

## 2022-09-16 LAB — BPAM RBC: ISSUE DATE / TIME: 202404091125

## 2022-09-16 MED ORDER — HYDROMORPHONE HCL 1 MG/ML IJ SOLN
0.5000 mg | INTRAMUSCULAR | Status: DC | PRN
  Administered 2022-09-16 – 2022-09-21 (×13): 0.5 mg via INTRAVENOUS
  Filled 2022-09-16: qty 0.5
  Filled 2022-09-16: qty 1
  Filled 2022-09-16 (×7): qty 0.5
  Filled 2022-09-16: qty 1
  Filled 2022-09-16 (×4): qty 0.5

## 2022-09-16 MED ORDER — SODIUM CHLORIDE 0.9 % IV SOLN
8.0000 mg/kg | Freq: Once | INTRAVENOUS | Status: DC
  Filled 2022-09-16: qty 12

## 2022-09-16 MED ORDER — AMLODIPINE BESYLATE 10 MG PO TABS
10.0000 mg | ORAL_TABLET | Freq: Every day | ORAL | Status: DC
  Administered 2022-09-16: 10 mg via ORAL
  Filled 2022-09-16: qty 1

## 2022-09-16 NOTE — Progress Notes (Signed)
Albert Harris  QQP:[id number] DOB: 04/14/1956 DOA: 08/28/2022 PCP: [name], NP    Brief Narrative:  67 year old with a history of ESRD on HD TTS, PVD, COPD, HTN, chronic systolic CHF with EF 35-40% who presented to the ER 3/22 with abdominal pain nausea and diaphoresis.  He was found to be hypotensive with a lactic acidosis and a pulseless left lower extremity.  He was admitted to the ICU, placed on vasopressors, and taken to the OR by Vascular Surgery for a left femoral exploration with pseudoaneurysm repair.  Significant Events: 3/23 admit to ICU on vasopressors -septic shock - to OR for left femoral exploration/pseudoaneurysm repair -emergent CRRT 3/27 HD AVG clotted  4/2 transferred to [facility] 4/3 CTa for bleeding of left groin noting thrombosis left CFA pseudoaneurysm -urgently taken back to the OR for left common iliac to profunda femoris bypass 4/6 2 episodes BRBPR - flexible sigmoidoscopy noting 5 rectal ulcers 4/7 2 recurrent episodes of BRBPR 4/9 colonoscopy - 2 large ulcers in the distal rectum with a visible vessel and Hemoclip placed - few ulcers in the ascending colon, cecum, and at the ileocecal valve - hemorrhoids -biopsies sent  Consultants:  PCCM Vascular Surgery GI Neurology  Goals of Care:  Code Status: Full Code   DVT prophylaxis: SCDs  Interim Hx: Afebrile.  Vital signs stable this morning.  Oxygen saturation 100% on 2 L nasal cannula.  Hemoglobin stable at 8.0. In good spirits. C/o some pain in the L leg and foot. No cp, sob, abdom pain.   Assessment & Plan:  Septic shock due to MRSA bacteremia - L groin MRSA wound infection - L groin hematoma OR culture 4/3 MRSA and Vanc resistant E Fecium - ID following - s/p line holiday - IV abx x 8 weeks thru 10/28/22 (for possible cervical osteomyelitis) then transition to doxy 100mg  bid for chronic suppression   L common femoral artery pseudoaneurysm - L groin bleeding 4/3 CTa noted arterial thrombus, new  distal left EIA pseudoaneurysm > urgent OR 09/09/22 for L common iliac to profunda femoris bypass / L common iliac artery angioplasty and stenting and muscle flap (sartorious to groin) - still w/ poor perfusion of L foot/will not be enough for healing - not a further revascularization candidate - patient aware likely limb loss down the road - cont asa and statin   Bright red bleeding per rectum flex sig 4/5 noted 5 rectal ulcers - CTa 4/7 no definite evidence of GI bleed - biopsies at time of colonoscopy noted chronic active colitis with ulcer slough, idiopathic inflammatory bowel disease cannot be excluded    PVD history of left external iliac artery and common artery stenting and right femoropopliteal bypass with left common femoral artery pseudoaneurysm on CT 3/22 - status post left femoral artery pseudoaneurysm repair 3/22 - S/P AVG thrombectomy attempted but patient became hemodynamically unstable and procedure was aborted - s/p OR with surgery as above on 09/09/22   Retroperitoneal hemorrhage on CTa 3/22 Stable on f/u CTa 09/13/22   COPD Stable, cont supplemental oxygen, IS, prn inhalers   Chronic Diastolic CHF - Pulmonary hypertension TEE 4/1 noted EF 50-55% severe LVH no left atrial thrombus    ESRD on HD TTS Nephrology following - cont HD as per schedule - AVG clotted on 3/27 - s/p temp catheter removed 3/28 - IR placed new TDC on 4/1   Acute blood loss anemia on Chronic Anemia of chronic kidney disease Acute blood loss multifactorial from operative loss,  retroperitoneal bleed, pseudoaneurysm/left groin bleeding and rectal bleeding - since 3/23 S/P 12 U PRBC   Acute Stroke w/ left leg weakness - small R external capsule infarct thought to be due to small vessel disease noted on MRI - TTE noted EF 45% but improved on TEE - carotid Dopplers bilateral ICA 40 to 59% stenosis - MRI motion degraded right AVG irregular appearance - LDL 45 - HbA1c 5.1 - on Plavix PTA - continue aspirin and statin  - evaluated by Neuro    HLD on crestor   Lung nodule 1.1 cm without significant change since 3/30 - likely infectious/inflammatory - recommended CT in 6 weeks  2.4 cm well-defined pancreatic tail mass unchanged since 05/05/2021 - needs CT in 1 year   Moderate protein calorie malnutrition moderate fat depletion, severe muscle depletion - RD following    Pressure injury of skin Pressure Injury 08/28/22 Sacrum Medial Unstageable - Full thickness tissue loss in which the base of the injury is covered by slough (yellow, tan, gray, green or brown) and/or eschar (tan, brown or black) in the wound bed. Sacral wound with eschar coveri (Active)  08/28/22 2228  Location: Sacrum  Location Orientation: Medial  Staging: Unstageable - Full thickness tissue loss in which the base of the injury is covered by slough (yellow, tan, gray, green or brown) and/or eschar (tan, brown or black) in the wound bed.  Wound Description (Comments): Sacral wound with eschar covering wound bed  Present on Admission: Yes     Pressure Injury 09/04/22 Heel Left Deep Tissue Pressure Injury - Purple or maroon localized area of discolored intact skin or blood-filled blister due to damage of underlying soft tissue from pressure and/or shear. (Active)  09/04/22 1200  Location: Heel  Location Orientation: Left  Staging: Deep Tissue Pressure Injury - Purple or maroon localized area of discolored intact skin or blood-filled blister due to damage of underlying soft tissue from pressure and/or shear.  Wound Description (Comments):   Present on Admission:    Goals of Care Currently full scope of treatment - left leg at risk of amputation  Family Communication: No family present at time of exam Disposition:  will need SNF rehab stay    Objective: Blood pressure (!) 163/75, pulse 81, temperature 98.2 F (36.8 C), temperature source Oral, resp. rate 18, height 5\' 8"  (1.727 m), weight 74.8 kg, SpO2 95 %.  Intake/Output  Summary (Last 24 hours) at 09/16/2022 0734 Last data filed at 09/16/2022 0200 Gross per 24 hour  Intake 858.96 ml  Output 850 ml  Net 8.96 ml   Filed Weights   09/15/22 0500 09/15/22 0836 09/16/22 0700  Weight: 74.5 kg 74.5 kg 74.8 kg    Examination: General: No acute respiratory distress Lungs: Clear to auscultation bilaterally without wheezes or crackles Cardiovascular: Regular rate and rhythm without murmur gallop or rub normal S1 and S2 Abdomen: Nontender, nondistended, soft, bowel sounds positive, no rebound, no ascites, no appreciable mass Extremities: Ischemia of the fourth and fifth toes on left foot noted  CBC: Recent Labs  Lab 09/13/22 1606 09/13/22 1959 09/14/22 0720 09/14/22 1126 09/15/22 1501 09/15/22 2229 09/16/22 0701  WBC 16.9*  --  20.1*  --   --   --  11.7*  HGB 7.2*   < > 8.1*   < > 7.4* 8.9* 8.0*  HCT 22.0*   < > 25.4*   < > 21.9* 26.9* 24.6*  MCV 94.4  --  95.8  --   --   --  91.1  PLT 173  --  213  --   --   --  178   < > = values in this interval not displayed.   Basic Metabolic Panel: Recent Labs  Lab 09/13/22 0304 09/13/22 1606 09/14/22 0720 09/15/22 0231  NA 133* 134* 134* 136  K 3.9 3.9 4.4 4.9  CL 100 95* 96* 102  CO2 24 26 21* 19*  GLUCOSE 86 94 93 92  BUN 34* 42* 47* 59*  CREATININE 3.68* 4.51* 5.23* 6.18*  CALCIUM 7.8* 8.0* 8.3* 7.7*  MG  --   --  2.1  --   PHOS 5.5*  --  7.6* 8.8*   GFR: Estimated Creatinine Clearance: 11.1 mL/min (A) (by C-G formula based on SCr of 6.18 mg/dL (H)).   Scheduled Meds:  (feeding supplement) PROSource Plus  30 mL Oral TID BM   sodium chloride   Intravenous Once   acetaminophen  650 mg Oral Q6H   aspirin EC  81 mg Oral Q0600   calcitRIOL  0.25 mcg Oral Q T,Th,Sa-HD   calcium acetate  1,334 mg Oral TID WC   Chlorhexidine Gluconate Cloth  6 each Topical Q0600   darbepoetin (ARANESP) injection - DIALYSIS  200 mcg Subcutaneous Q Wed-1800   feeding supplement (NEPRO CARB STEADY)  237 mL Oral TID  BM   HYDROmorphone   Intravenous Q4H   [START ON 09/17/2022] leptospermum manuka honey  1 Application Topical Daily   liver oil-zinc oxide   Topical TID   methocarbamol  500 mg Oral TID   multivitamin  1 tablet Oral QHS   pantoprazole  40 mg Oral Daily   polyethylene glycol  17 g Oral BID   rosuvastatin  10 mg Oral Daily   sodium chloride flush  10-40 mL Intracatheter Q12H   sodium hypochlorite   Irrigation BID   Continuous Infusions:  sodium chloride Stopped (09/08/22 0904)   sodium chloride 10 mL/hr at 09/16/22 0200   sodium chloride     DAPTOmycin (CUBICIN) 600 mg in sodium chloride 0.9 % IVPB     [START ON 09/19/2022] DAPTOmycin (CUBICIN) 750 mg in sodium chloride 0.9 % IVPB     magnesium sulfate bolus IVPB       LOS: 19 days   Lonia BloodJeffrey T. Ludella Pranger, MD [facility] Office  [phone] Pager - Text Page per Loretha StaplerAmion  If 7PM-7AM, please contact night-coverage per Amion 09/16/2022, 7:34 AM

## 2022-09-16 NOTE — H&P (View-Only) (Signed)
Progress Note  Primary GI: unassigned  LOS: 19 days   Chief Complaint:rectal bleeding   Subjective  Patient states he is doing well. No further bleeding. Denies abdominal pain, nausea, vomiting. Having some pain with his legs  No family was present at the time of my evaluation.    Objective   Vital signs in last 24 hours: Temp:  [97.6 F (36.4 C)-98.4 F (36.9 C)] 98.4 F (36.9 C) (04/10 0941) Pulse Rate:  [70-98] 73 (04/10 0900) Resp:  [11-32] 20 (04/10 0941) BP: (92-179)/(45-114) 179/57 (04/10 0941) SpO2:  [90 %-100 %] 100 % (04/10 0900) Weight:  [74.8 kg] 74.8 kg (04/10 0700) Last BM Date : 09/15/22 Last BM recorded by nurses in past 5 days Stool Type: Type 6 (Mushy consistency with ragged edges) (09/15/2022  6:30 PM)  General:   male in no acute distress  Heart:  Regular rate and rhythm; no murmurs Pulm: Clear anteriorly; no wheezing Abdomen: soft, nondistended, normal bowel sounds in all quadrants. Nontender without guarding. No organomegaly appreciated. Neurologic:  Alert and  oriented x4;  No focal deficits.  Psych:  Cooperative. Normal mood and affect.  Intake/Output from previous day: 04/09 0701 - 04/10 0700 In: 859 [P.O.:100; I.V.:444; Blood:315] Out: 850 [Drains:50] Intake/Output this shift: Total I/O In: 142 [P.O.:50; Other:30; IV Piggyback:62] Out: 50 [Drains:50]  Studies/Results: No results found.  Lab Results: Recent Labs    09/13/22 1606 09/13/22 1959 09/14/22 0720 09/14/22 1126 09/15/22 1501 09/15/22 2229 09/16/22 0701  WBC 16.9*  --  20.1*  --   --   --  11.7*  HGB 7.2*   < > 8.1*   < > 7.4* 8.9* 8.0*  HCT 22.0*   < > 25.4*   < > 21.9* 26.9* 24.6*  PLT 173  --  213  --   --   --  178   < > = values in this interval not displayed.   BMET Recent Labs    09/14/22 0720 09/15/22 0231 09/16/22 0701  NA 134* 136 135  K 4.4 4.9 3.6  CL 96* 102 100  CO2 21* 19* 26  GLUCOSE 93 92 92  BUN 47* 59* 33*  CREATININE 5.23* 6.18* 4.46*   CALCIUM 8.3* 7.7* 7.8*   LFT Recent Labs    09/16/22 0701  ALBUMIN 1.8*   PT/INR No results for input(s): "LABPROT", "INR" in the last 72 hours.   Scheduled Meds:  (feeding supplement) PROSource Plus  30 mL Oral TID BM   acetaminophen  650 mg Oral Q6H   amLODipine  10 mg Oral Daily   aspirin EC  81 mg Oral Q0600   calcitRIOL  0.25 mcg Oral Q T,Th,Sa-HD   calcium acetate  1,334 mg Oral TID WC   Chlorhexidine Gluconate Cloth  6 each Topical Q0600   darbepoetin (ARANESP) injection - DIALYSIS  200 mcg Subcutaneous Q Wed-1800   feeding supplement (NEPRO CARB STEADY)  237 mL Oral TID BM   [START ON 09/17/2022] leptospermum manuka honey  1 Application Topical Daily   liver oil-zinc oxide   Topical TID   methocarbamol  500 mg Oral TID   multivitamin  1 tablet Oral QHS   pantoprazole  40 mg Oral Daily   polyethylene glycol  17 g Oral BID   rosuvastatin  10 mg Oral Daily   sodium chloride flush  10-40 mL Intracatheter Q12H   sodium hypochlorite   Irrigation BID   Continuous Infusions:  DAPTOmycin (CUBICIN) 600 mg in  sodium chloride 0.9 % IVPB Stopped (09/16/22 0837)   DAPTOmycin (CUBICIN) 600 mg in sodium chloride 0.9 % IVPB     [START ON 09/19/2022] DAPTOmycin (CUBICIN) 750 mg in sodium chloride 0.9 % IVPB         Impression/Plan:   Hematochezia; secondary to rectal ulcers Hgb 8.0 (8.9) Colonoscopy 09/15/2022: 2 large ulcers in distal rectum, clip placed.  Ulcers and ascending colon, cecum, ileocecal valve, biopsied.  Diverticulosis in descending and sigmoid colon.  Inflamed mucosa in sigmoid colon, biopsied.  2 mm polyp in distal transverse colon, resected.  Nonbleeding internal hemorrhoids.   - No further bleeding at this time -Biopsies showed chronic active colitis with ulcer slough, idiopathic inflammatory bowel disease cannot be excluded.  Sigmoid colon biopsy shows mild ischemic changes. Not having any diarrhea/abdominal pain. - Continue to monitor hemoglobin and transfuse  to keep above 7.  Suspect hemoglobin will improve with cessation of rectal bleeding.    ESRD on dialysis Septic shock MRSA septicemia -Followed by ID and on IV antibiotics  Aiyanna Awtrey M Mollie Rossano  09/16/2022, 2:05 PM

## 2022-09-16 NOTE — Progress Notes (Signed)
  Progress Note  Primary GI: unassigned  LOS: 19 days   Chief Complaint:rectal bleeding   Subjective  Patient states he is doing well. No further bleeding. Denies abdominal pain, nausea, vomiting. Having some pain with his legs  No family was present at the time of my evaluation.    Objective   Vital signs in last 24 hours: Temp:  [97.6 F (36.4 C)-98.4 F (36.9 C)] 98.4 F (36.9 C) (04/10 0941) Pulse Rate:  [70-98] 73 (04/10 0900) Resp:  [11-32] 20 (04/10 0941) BP: (92-179)/(45-114) 179/57 (04/10 0941) SpO2:  [90 %-100 %] 100 % (04/10 0900) Weight:  [74.8 kg] 74.8 kg (04/10 0700) Last BM Date : 09/15/22 Last BM recorded by nurses in past 5 days Stool Type: Type 6 (Mushy consistency with ragged edges) (09/15/2022  6:30 PM)  General:   male in no acute distress  Heart:  Regular rate and rhythm; no murmurs Pulm: Clear anteriorly; no wheezing Abdomen: soft, nondistended, normal bowel sounds in all quadrants. Nontender without guarding. No organomegaly appreciated. Neurologic:  Alert and  oriented x4;  No focal deficits.  Psych:  Cooperative. Normal mood and affect.  Intake/Output from previous day: 04/09 0701 - 04/10 0700 In: 859 [P.O.:100; I.V.:444; Blood:315] Out: 850 [Drains:50] Intake/Output this shift: Total I/O In: 142 [P.O.:50; Other:30; IV Piggyback:62] Out: 50 [Drains:50]  Studies/Results: No results found.  Lab Results: Recent Labs    09/13/22 1606 09/13/22 1959 09/14/22 0720 09/14/22 1126 09/15/22 1501 09/15/22 2229 09/16/22 0701  WBC 16.9*  --  20.1*  --   --   --  11.7*  HGB 7.2*   < > 8.1*   < > 7.4* 8.9* 8.0*  HCT 22.0*   < > 25.4*   < > 21.9* 26.9* 24.6*  PLT 173  --  213  --   --   --  178   < > = values in this interval not displayed.   BMET Recent Labs    09/14/22 0720 09/15/22 0231 09/16/22 0701  NA 134* 136 135  K 4.4 4.9 3.6  CL 96* 102 100  CO2 21* 19* 26  GLUCOSE 93 92 92  BUN 47* 59* 33*  CREATININE 5.23* 6.18* 4.46*   CALCIUM 8.3* 7.7* 7.8*   LFT Recent Labs    09/16/22 0701  ALBUMIN 1.8*   PT/INR No results for input(s): "LABPROT", "INR" in the last 72 hours.   Scheduled Meds:  (feeding supplement) PROSource Plus  30 mL Oral TID BM   acetaminophen  650 mg Oral Q6H   amLODipine  10 mg Oral Daily   aspirin EC  81 mg Oral Q0600   calcitRIOL  0.25 mcg Oral Q T,Th,Sa-HD   calcium acetate  1,334 mg Oral TID WC   Chlorhexidine Gluconate Cloth  6 each Topical Q0600   darbepoetin (ARANESP) injection - DIALYSIS  200 mcg Subcutaneous Q Wed-1800   feeding supplement (NEPRO CARB STEADY)  237 mL Oral TID BM   [START ON 09/17/2022] leptospermum manuka honey  1 Application Topical Daily   liver oil-zinc oxide   Topical TID   methocarbamol  500 mg Oral TID   multivitamin  1 tablet Oral QHS   pantoprazole  40 mg Oral Daily   polyethylene glycol  17 g Oral BID   rosuvastatin  10 mg Oral Daily   sodium chloride flush  10-40 mL Intracatheter Q12H   sodium hypochlorite   Irrigation BID   Continuous Infusions:  DAPTOmycin (CUBICIN) 600 mg in   sodium chloride 0.9 % IVPB Stopped (09/16/22 0837)   DAPTOmycin (CUBICIN) 600 mg in sodium chloride 0.9 % IVPB     [START ON 09/19/2022] DAPTOmycin (CUBICIN) 750 mg in sodium chloride 0.9 % IVPB         Impression/Plan:   Hematochezia; secondary to rectal ulcers Hgb 8.0 (8.9) Colonoscopy 09/15/2022: 2 large ulcers in distal rectum, clip placed.  Ulcers and ascending colon, cecum, ileocecal valve, biopsied.  Diverticulosis in descending and sigmoid colon.  Inflamed mucosa in sigmoid colon, biopsied.  2 mm polyp in distal transverse colon, resected.  Nonbleeding internal hemorrhoids.   - No further bleeding at this time -Biopsies showed chronic active colitis with ulcer slough, idiopathic inflammatory bowel disease cannot be excluded.  Sigmoid colon biopsy shows mild ischemic changes. Not having any diarrhea/abdominal pain. - Continue to monitor hemoglobin and transfuse  to keep above 7.  Suspect hemoglobin will improve with cessation of rectal bleeding.    ESRD on dialysis Septic shock MRSA septicemia -Followed by ID and on IV antibiotics  Perl Kerney M Branna Cortina  09/16/2022, 2:05 PM

## 2022-09-16 NOTE — Progress Notes (Signed)
Contacted pt's out- pt HD clinic to obtain pt's chair time due to possible snf placement. Also advised clinic pt will need iv daptomycin with HD at d/c, Inquiring if clinic has med available or will med need to be ordered. Awaiting response. Will assist as needed.   Olivia Canter Renal Navigator [phone]

## 2022-09-16 NOTE — [place] FL2 (Signed)
[place] MEDICAID FL2 LEVEL OF CARE FORM     IDENTIFICATION  Patient Name: Albert Harris Birthdate: [birth year] Sex: male Admission Date (Current Location): 08/28/2022  [facility] and [place] Number:  Producer, television/film/video and Address:  The [place], [address], [place], [place] [address]      Provider Number: [id number]  Attending Physician Name and Address:  Lonia Blood, MD  Relative Name and Phone Number:  Darel Hong [facility]) [phone]    Current Level of Care: Hospital Recommended Level of Care: Skilled Nursing Facility Prior Approval Number:    Date Approved/Denied:   PASRR Number: [id number]  Discharge Plan: SNF    Current Diagnoses: Patient Active Problem List   Diagnosis Date Noted   Colonic ulcer 09/15/2022   MRSA (methicillin resistant Staphylococcus aureus) septicemia 09/08/2022   Pressure injury of skin 09/08/2022   Septic shock 09/03/2022   Pseudoaneurysm of femoral artery 09/03/2022   Decreased functional mobility 09/03/2022   Malnutrition of moderate degree 09/01/2022   Lactic acidosis 08/28/2022   ESRD (end stage renal disease) on dialysis 08/28/2022   Acute lower limb ischemia 08/21/2022   Leukocytosis 08/21/2022   Nucleic acid assay by PCR positive for norovirus 08/13/2022   Nausea vomiting and diarrhea 08/13/2022   Colitis 08/12/2022   Retroperitoneal hematoma 08/12/2022   C. difficile colitis 07/22/2022   Alcohol use disorder 06/30/2022   Clostridium difficile diarrhea 06/30/2022   Other ascites 06/30/2022   Acute systolic congestive heart failure 06/30/2022   ESRD on dialysis 06/28/2022   Spinal stenosis of cervical region 06/28/2022   Diarrhea 06/28/2022   Cirrhosis of liver 06/28/2022   Lymphedema 06/28/2022   HFrEF (heart failure with reduced ejection fraction) 06/28/2022   PAD (peripheral artery disease) 09/05/2021   Metabolic encephalopathy 05/09/2021   Thrombocytopenia 05/05/2021   Goals of care,  counseling/discussion    Hypertension    COPD (chronic obstructive pulmonary disease)    Lower GI bleed    Claudication in peripheral vascular disease 09/12/2019   Carotid stenosis 09/12/2019   PFO (patent foramen ovale) 08/10/2019   Noncompliance 08/10/2019   Hx of transient ischemic attack ([name]) 08/10/2019   Chronic diastolic heart failure    Elevated troponin    Hypertensive emergency    Acute pulmonary edema 07/18/2019   ARF (acute renal failure) 07/18/2019   Macrocytic anemia 07/18/2019   ESRD (end stage renal disease) 11/09/2017    Orientation RESPIRATION BLADDER Height & Weight     Self, Time, Situation, Place  Normal Continent Weight: 164 lb 14.5 oz (74.8 kg) Height:  5\' 8"  (172.7 cm)  BEHAVIORAL SYMPTOMS/MOOD NEUROLOGICAL BOWEL NUTRITION STATUS      Incontinent Diet (Please see discharge summary)  AMBULATORY STATUS COMMUNICATION OF NEEDS Skin   Extensive Assist Verbally Other (Comment) (Ecchymosis,abdomen,arm,BIl.,Wound/Incision LDAs,PI sacrum,medial,unstageable,gauze,changed twice a day,PI heel,L,deep tissue,foam lift dressing,every 3 days,non-healing,Inc.closed arm,R,Inc. closed,neck,R,Inc.closed,neck,R,Please see add.info)                       Personal Care Assistance Level of Assistance  Bathing, Feeding, Dressing Bathing Assistance: Maximum assistance Feeding assistance: Limited assistance Dressing Assistance: Maximum assistance     Functional Limitations Info  Sight, Speech, Hearing Sight Info:  (eyeglasses) Hearing Info: Impaired Speech Info: Adequate    SPECIAL CARE FACTORS FREQUENCY  PT (By licensed PT), OT (By licensed OT)     PT Frequency: 5x min weekly OT Frequency: 5x min weekly  Contractures Contractures Info: Not present    Additional Factors Info  Code Status, Allergies, Isolation Precautions Code Status Info: FULL Allergies Info: Benadryl (diphenhydramine),Lidocaine-prilocaine     Isolation Precautions Info: 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/09/2022,MRSA onset date 08/29/22     Current Medications (09/16/2022):  This is the current hospital active medication list Current Facility-Administered Medications  Medication Dose Route Frequency Provider Last Rate Last Admin   (feeding supplement) PROSource Plus liquid 30 mL  30 mL Oral TID BM Kc, Ramesh, MD   30 mL at 09/16/22 1028   acetaminophen (TYLENOL) tablet 650 mg  650 mg Oral Q6H Leonie DouglasHawken, Thomas N, MD   650 mg at 09/16/22 1027   alteplase (CATHFLO ACTIVASE) injection 2 mg  2 mg Intracatheter Once PRN Hunsucker, Lesia SagoMatthew R, MD       amLODipine (NORVASC) tablet 10 mg  10 mg Oral Daily Cheri Fowlerhand, Sudham, MD   10 mg at 09/16/22 1027   aspirin EC tablet 81 mg  81 mg Oral Q0600 Schuh, McKenzi P, PA-C   81 mg at 09/16/22 [id number]   calcitRIOL (ROCALTROL) capsule 0.25 mcg  0.25 mcg Oral Q T,Th,Sa-HD Delano MetzSchertz, Robert, MD   0.25 mcg at 09/15/22 1121   calcium acetate (PHOSLO) capsule 1,334 mg  1,334 mg Oral TID WC Delano MetzSchertz, Robert, MD   1,334 mg at 09/16/22 1253   Chlorhexidine Gluconate Cloth 2 % PADS 6 each  6 each Topical Q0600 Delano MetzSchertz, Robert, MD   6 each at 09/15/22 0603   DAPTOmycin (CUBICIN) 600 mg in sodium chloride 0.9 % IVPB  8 mg/kg Intravenous Once per day on Tue Thu Ann HeldMartin, Elizabeth J, [facility] Center[facility]   Stopped at 09/16/22 [id number]   DAPTOmycin (CUBICIN) 600 mg in sodium chloride 0.9 % IVPB  8 mg/kg Intravenous Once Ann HeldMartin, Elizabeth J, [facility]       [START ON 09/19/2022] DAPTOmycin (CUBICIN) 750 mg in sodium chloride 0.9 % IVPB  10 mg/kg Intravenous Q Sat-1800 Ann HeldMartin, Elizabeth J, [facility]       Darbepoetin Alfa (ARANESP) injection 200 mcg  200 mcg Subcutaneous Q Wed-1800 Sabra HeckSanford, Ryan B, MD       dicyclomine (BENTYL) capsule 10 mg  10 mg Oral Q6H PRN McMichael, Bayley M, PA-C   10 mg at 09/14/22 2241   docusate sodium (COLACE) capsule 100 mg  100 mg Oral BID PRN Hunsucker, Lesia SagoMatthew R, MD       feeding supplement (NEPRO CARB STEADY) liquid 237 mL  237 mL Oral TID BM Cheri Fowlerhand, Sudham, MD       heparin  injection 1,000 Units  1,000 Units Dialysis PRN Hunsucker, Lesia SagoMatthew R, MD   3,200 Units at 09/15/22 1915   hydrALAZINE (APRESOLINE) injection 10 mg  10 mg Intravenous Q4H PRN Hunsucker, Lesia SagoMatthew R, MD   10 mg at 09/16/22 0020   HYDROmorphone (DILAUDID) injection 0.5 mg  0.5 mg Intravenous Q4H PRN Jetty DuhamelMcClung, Jeffrey T, MD   0.5 mg at 09/16/22 1253   [START ON 09/17/2022] leptospermum manuka honey (MEDIHONEY) paste 1 Application  1 Application Topical Daily Kc, Dayna Barkeramesh, MD       liver oil-zinc oxide (DESITIN) 40 % ointment   Topical TID Lanae BoastKc, Ramesh, MD   Given at 09/16/22 1147   methocarbamol (ROBAXIN) tablet 500 mg  500 mg Oral TID Leonie DouglasHawken, Thomas N, MD   500 mg at 09/16/22 1027   multivitamin (RENA-VIT) tablet 1 tablet  1 tablet Oral QHS Hunsucker, Lesia SagoMatthew R, MD   1 tablet at 09/15/22 2204   Oral care  mouth rinse  15 mL Mouth Rinse PRN Hunsucker, Lesia Sago, MD       oxyCODONE (Oxy IR/ROXICODONE) immediate release tablet 5 mg  5 mg Oral Q4H PRN Hunsucker, Lesia Sago, MD   5 mg at 09/16/22 1140   pantoprazole (PROTONIX) EC tablet 40 mg  40 mg Oral Daily Hunsucker, Lesia Sago, MD   40 mg at 09/16/22 1027   pentafluoroprop-tetrafluoroeth (GEBAUERS) aerosol 1 Application  1 Application Topical PRN Hunsucker, Lesia Sago, MD       polyethylene glycol (MIRALAX / GLYCOLAX) packet 17 g  17 g Oral BID Imogene Burn, MD   17 g at 09/14/22 2149   rosuvastatin (CRESTOR) tablet 10 mg  10 mg Oral Daily Hunsucker, Lesia Sago, MD   10 mg at 09/16/22 1027   sodium chloride flush (NS) 0.9 % injection 10-40 mL  10-40 mL Intracatheter Q12H Hunsucker, Lesia Sago, MD   10 mL at 09/16/22 1028   sodium chloride flush (NS) 0.9 % injection 10-40 mL  10-40 mL Intracatheter PRN Hunsucker, Lesia Sago, MD       sodium hypochlorite (DAKIN'S 1/4 STRENGTH) topical solution   Irrigation BID Lanae Boast, MD   Given at 09/16/22 1147     Discharge Medications: Please see discharge summary for a list of discharge medications.  Relevant Imaging  Results:  Relevant Lab Results:   Additional Information [id number] Incision closed Groin,L,Neg. pressure wound,Inc. closed thigh,L,honeycomb,PRN,Neg pressure,wound therapy,groin,L,proximal  Delilah Shan, LCSWA

## 2022-09-16 NOTE — Progress Notes (Signed)
[facility] Progress Note  Subjective:  HD cut short yesterday 2/2 frequent clotting Did not rec heparin 0.8L UF CSY yesterday, Hb stable around 8.0 CSY yesterday ulcers throughout colon; clipped large ulcer in distal rectum  Vitals:   09/16/22 0500 09/16/22 0600 09/16/22 0700 09/16/22 0800  BP: (!) 154/65 (!) 163/63 (!) 163/75 (!) 150/78  Pulse: 72 81  75  Resp: 15 16 18 15   Temp:      TempSrc:      SpO2: 99% 90% 95% 100%  Weight:   74.8 kg   Height:        Exam: Constitutional: lying in bed, alert  CV: normal rate, no edema Respiratory: bilateral chest rise, no iwob Gastrointestinal: soft, nonended Psych: mood and affect appropriate  Access: Right upper extremity AVG, no bruit or thrill  L fem wound covered w/ dressing   L foot a bit discolored    OP HD: TTS SW  4h   350/800  64.5kg  RFA AVG= clotted/ new TDC in place   Heparin none  - last OP HD 3/14, post wt 65.9kg - venofer 50mg  IV weekly - rocaltrol 0.25 mcg po tiw - phoslo 2 ac tid - no esa, last Hb 11.0  Summary: presented 08/21/22 w/ acute pain/ ischemic L foot. Hx of L EIA stenting on 3/04, and prior RLE bypass. Pt underwent endarterectomy of the left common femoral, profundofemoral, superficial femoral and external iliac vessels with bovine pericardial patch angioplasty and a left iliofemoral and profundofemoral thromboembolectomy on 3/15. Was in ICU until 3/18 for hypotension. DC'd home on 3/18. Pt then returned on 3/22 to ED w/ N/V and unable to feel his L foot. BP's low in ED and was found to have L fem pseudoaneurysm. Had emergent CRRT for high K+. Then taken to OR 3/23 for primary repair of the distal EIA w/ hematoma evacuation. Extubated 3/24. BCx's from  admit were + for MRSA, IV vanc was started. On 3/26 was off CRRT and off pressors. R arm AVG was clotted and went for declot on 3/27 per VVS but graft re-clotted. Pt had regular HD on 3/28 then temp cath was removed for line holiday. IR placed  new TDC on 4/01 and pt had regular HD later the same day. Otherwise as below.    Assessment/ Plan: Left common femoral artery pseudoaneurysm - sp repair of L EIA on 3/23. Went back to OR 4/3 for L CIA to profunda bypass, L CIA pta and stenting, and muscle flap (sartorious to groin). Still high risk of limb loss.   MRSA Bacteremia, VRE tissue cx - sp line holiday and getting IV daptomycin per ID/Pharmacy x 6wk, then suppressive therapy.  CVA: +stroke on MRI. Neuro signed off.  ESRD: TTS HD.  SP CRRT 3/23- 3/26. Had HD 3/28, then the temp cath was removed for line holiday. On 4/1 VVS placed TDC and is now back on TTS schedule. Next HD today 4/11 Volume - min edema LE's, 2L O2, soft BP's, doubt sig vol excess Anemia: Hb 7s from ongoing GI losses.  S/p CSY 09/15/22.  Inc ESA to higher dose.  Transfuse prn.  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 Cont po vdra and phoslo 2 ac tid as binder.  Dialysis access: AVG clotted 3/27, went for declot by VVS and then re-clotted. Temp cath removed 3/28. VVS placed new RIJ TDC on 4/1. Using [facility] wit hfrequent clotting.  D/w GI and VVS if we can safely consider IVB hep  with HD.    Arita Miss, MD  [facility] 09/16/2022, 8:23 AM  Recent Labs  Lab 09/15/22 0231 09/15/22 0841 09/15/22 2229 09/16/22 0701  HGB 7.1*   < > 8.9* 8.0*  ALBUMIN 1.7*  --   --  1.8*  CALCIUM 7.7*  --   --  7.8*  PHOS 8.8*  --   --  5.3*  CREATININE 6.18*  --   --  4.46*  K 4.9  --   --  3.6   < > = values in this interval not displayed.    No results for input(s): "IRON", "TIBC", "FERRITIN" in the last 168 hours. Inpatient medications:  (feeding supplement) PROSource Plus  30 mL Oral TID BM   acetaminophen  650 mg Oral Q6H   amLODipine  10 mg Oral Daily   aspirin EC  81 mg Oral Q0600   calcitRIOL  0.25 mcg Oral Q T,Th,Sa-HD   calcium acetate  1,334 mg Oral TID WC   Chlorhexidine Gluconate Cloth  6 each Topical Q0600   darbepoetin (ARANESP) injection - DIALYSIS  200 mcg Subcutaneous Q  Wed-1800   feeding supplement (NEPRO CARB STEADY)  237 mL Oral TID BM   [START ON 09/17/2022] leptospermum manuka honey  1 Application Topical Daily   liver oil-zinc oxide   Topical TID   methocarbamol  500 mg Oral TID   multivitamin  1 tablet Oral QHS   pantoprazole  40 mg Oral Daily   polyethylene glycol  17 g Oral BID   rosuvastatin  10 mg Oral Daily   sodium chloride flush  10-40 mL Intracatheter Q12H   sodium hypochlorite   Irrigation BID    DAPTOmycin (CUBICIN) 600 mg in sodium chloride 0.9 % IVPB 600 mg (09/16/22 0807)   DAPTOmycin (CUBICIN) 600 mg in sodium chloride 0.9 % IVPB     [START ON 09/19/2022] DAPTOmycin (CUBICIN) 750 mg in sodium chloride 0.9 % IVPB     alteplase, dicyclomine, docusate sodium, heparin, hydrALAZINE, mouth rinse, oxyCODONE, pentafluoroprop-tetrafluoroeth, sodium chloride flush

## 2022-09-16 NOTE — TOC Initial Note (Signed)
Transition of Care [facility]) - Initial/Assessment Note    Patient Details  Name: Albert Harris MRN: [id number] Date of Birth: 1955-07-12  Transition [facility]) CM/SW Contact:    Delilah Shan, LCSWA Phone Number: 09/16/2022, 1:46 PM  Clinical Narrative:                  CSW received consult for possible SNF placement at time of discharge. CSW spoke with patient at bedside regarding PT recommendation of SNF placement at time of discharge. PTA patient reports he comes from home with spouse.Patient expressed understanding of PT recommendation and is currently unsure if he wants to go to SNF but is agreeable for CSW to fax him out for SNF placement near the [place] area to see what bed offers he gets.  CSW discussed insurance authorization process and will provide Medicare SNF ratings list with accepted SNF bed offers when available. CSW to follow up with patient tomorrow with SNF bed offers/dc plan.No further questions reported at this time. CSW to continue to follow and assist with discharge planning needs.  Expected Discharge Plan: Skilled Nursing Facility Barriers to Discharge: Continued Medical Work up   Patient Goals and CMS Choice   CMS Medicare.gov Compare Post Acute Care list provided to:: Patient Represenative (must comment) (Daughter-[name])        Expected Discharge Plan and Services In-house Referral: Clinical Social Work   Post Acute Care Choice: Skilled Nursing Facility Living arrangements for the past 2 months: Single Family Home                                      Prior Living Arrangements/Services Living arrangements for the past 2 months: Single Family Home Lives with:: Spouse Patient language and need for interpreter reviewed:: Yes        Need for Family Participation in Patient Care: Yes (Comment) Care giver support system in place?: Yes (comment) Current home services: DME (rolling walker) Criminal Activity/Legal Involvement Pertinent to  Current Situation/Hospitalization: No - Comment as needed  Activities of Daily Living      Permission Sought/Granted Permission sought to share information with : Case Manager, Family Supports Permission granted to share information with : Yes, Verbal Permission Granted  Share Information with NAME: August Saucer  Permission granted to share info w AGENCY: SNF, IP rehab, Home Health  Permission granted to share info w Relationship: daughter  Permission granted to share info w Contact Information: [phone]  Emotional Assessment Appearance:: Appears stated age Attitude/Demeanor/Rapport: Unable to Assess Affect (typically observed): Unable to Assess Orientation: :  (not recorded)      Admission diagnosis:  Lactic acidosis [E87.20] Patient Active Problem List   Diagnosis Date Noted   Colonic ulcer 09/15/2022   MRSA (methicillin resistant Staphylococcus aureus) septicemia 09/08/2022   Pressure injury of skin 09/08/2022   Septic shock 09/03/2022   Pseudoaneurysm of femoral artery 09/03/2022   Decreased functional mobility 09/03/2022   Malnutrition of moderate degree 09/01/2022   Lactic acidosis 08/28/2022   ESRD (end stage renal disease) on dialysis 08/28/2022   Acute lower limb ischemia 08/21/2022   Leukocytosis 08/21/2022   Nucleic acid assay by PCR positive for norovirus 08/13/2022   Nausea vomiting and diarrhea 08/13/2022   Colitis 08/12/2022   Retroperitoneal hematoma 08/12/2022   C. difficile colitis 07/22/2022   Alcohol use disorder 06/30/2022   Clostridium difficile diarrhea 06/30/2022   Other ascites 06/30/2022  Acute systolic congestive heart failure 06/30/2022   ESRD on dialysis 06/28/2022   Spinal stenosis of cervical region 06/28/2022   Diarrhea 06/28/2022   Cirrhosis of liver 06/28/2022   Lymphedema 06/28/2022   HFrEF (heart failure with reduced ejection fraction) 06/28/2022   PAD (peripheral artery disease) 09/05/2021   Metabolic encephalopathy  05/09/2021   Thrombocytopenia 05/05/2021   Goals of care, counseling/discussion    Hypertension    COPD (chronic obstructive pulmonary disease)    Lower GI bleed    Claudication in peripheral vascular disease 09/12/2019   Carotid stenosis 09/12/2019   PFO (patent foramen ovale) 08/10/2019   Noncompliance 08/10/2019   Hx of transient ischemic attack ([name]) 08/10/2019   Chronic diastolic heart failure    Elevated troponin    Hypertensive emergency    Acute pulmonary edema 07/18/2019   ARF (acute renal failure) 07/18/2019   Macrocytic anemia 07/18/2019   ESRD (end stage renal disease) 11/09/2017   PCP:  [name], NP Pharmacy:   [facility] [phone] - [place] Otto, [place] - [address] AT [facility] [address] [facility] [address] [place] [address] Phone: [phone] Fax: [phone]  [facility] [address] [place] [address] Phone: [phone] Fax: [phone]     Social Determinants of Health (SDOH) Social History: SDOH Screenings   Food Insecurity: Food Insecurity Present (08/22/2022)  Housing: Low Risk  (08/22/2022)  Transportation Needs: Unmet Transportation Needs (08/22/2022)  Utilities: Not At Risk (08/22/2022)  Alcohol Screen: Low Risk  (09/12/2019)  Depression (PHQ2-9): High Risk (05/07/2022)  Financial Resource Strain: Low Risk  (09/12/2019)  Stress: No Stress Concern Present (09/12/2019)  Tobacco Use: Medium Risk (09/15/2022)   SDOH Interventions:     Readmission Risk Interventions    08/24/2022   11:27 AM 08/14/2022   10:07 AM 07/24/2022   11:52 AM  Readmission Risk Prevention Plan  Transportation Screening Complete Complete Complete  Medication Review (RN Care Manager) Referral to Pharmacy Referral to Pharmacy Complete  PCP or Specialist appointment within 3-5 days of discharge  Complete Complete  HRI or Home Care Consult Complete Patient refused Complete  SW Recovery Care/Counseling  Consult Complete Complete Patient refused  Palliative Care Screening Not Applicable Not Applicable Not Applicable  Skilled Nursing Facility Not Applicable Not Applicable Not Applicable

## 2022-09-16 NOTE — Progress Notes (Signed)
Occupational Therapy Treatment Patient Details Name: Albert Harris MRN: [id number] DOB: 14-Mar-1956 Today's Date: 09/16/2022   History of present illness 67 year old male  ER with abdominal pain, nausea, diaphoresis and unable to feel his Lt foot. Hypotensive with lactic acidosis in ER.  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 CRRT 3/22-3/26.  3/22 repair of distal Lt external iliac artery. On 3/30, Hgb drop from 9 to 7 with worsening abdominal pain, CT abd 3/30 shows slightly larger anterior pelvic extraperitoneal hematoma. PMHx: PAD, ESRD HD, recently hospitalized 3/16-18 for acute on chronic left lower extremity ischemia.   OT comments  Further addressed tilting tolerance during session in an effort to progress ability to stand and WB w/o excessive L hip flexion per vascular surgeon's recommendations. Pt cognition appears back to baseline, appropriate throughout session. Pt able to tolerate tilting to 40* well w/ slight drop in BP that recovered and remained stable. PT entering w/ pt able to progress to 60* tilting with improved traction noted with tennis shoe wear. However, increased discomfort noted in L toes w/ personal tennis shoes. Plan to further problem solve optimal shoe to prevent sliding and pain in next sessions.    Recommendations for follow up therapy are one component of a multi-disciplinary discharge planning process, led by the attending physician.  Recommendations may be updated based on patient status, additional functional criteria and insurance authorization.    Assistance Recommended at Discharge Intermittent Supervision/Assistance  Patient can return home with the following  A lot of help with walking and/or transfers;A lot of help with bathing/dressing/bathroom   Equipment Recommendations  Other (comment) (TBD pending progress)    Recommendations for Other Services      Precautions / Restrictions Precautions Precautions: Fall Precaution  Comments: L groin wound vac; try to keep L hip in neutral position per vascular surgeon Restrictions Weight Bearing Restrictions: No LLE Weight Bearing: Weight bearing as tolerated       Mobility Bed Mobility                    Transfers                         Balance                                           ADL either performed or assessed with clinical judgement   ADL Overall ADL's : Needs assistance/impaired                     Lower Body Dressing: Total assistance;Bed level Lower Body Dressing Details (indicate cue type and reason): socks/shoes mgmt               General ADL Comments: Focus on tilting tolerance in hopes to progress to standing/stepping off of tilt bed d/t hip flexion restrictions. Up to 40* with BP fairly stable and then up to 60* with PT entering to further assess w/ shoes/traction to maximize safety    Extremity/Trunk Assessment Upper Extremity Assessment Upper Extremity Assessment: Generalized weakness   Lower Extremity Assessment Lower Extremity Assessment: Defer to PT evaluation        Vision   Vision Assessment?: No apparent visual deficits   Perception     Praxis      Cognition Arousal/Alertness: Awake/alert Behavior During Therapy: [facility] for  tasks assessed/performed Overall Cognitive Status: Within Functional Limits for tasks assessed                                          Exercises Exercises: Other exercises Other Exercises Other Exercises: quad activation in tilting Other Exercises: modified sit ups with bedrails in tilting Other Exercises: reaching across abdomen in tilting    Shoulder Instructions       General Comments Daughter at bedside    Pertinent Vitals/ Pain       Pain Assessment Pain Assessment: Faces Faces Pain Scale: Hurts even more Pain Location: L toes w/ shoe doffing, ankle dorsiflexion Pain Descriptors / Indicators: Grimacing,  Guarding, Sore Pain Intervention(s): Monitored during session, RN gave pain meds during session  Home Living                                          Prior Functioning/Environment              Frequency  Min 2X/week        Progress Toward Goals  OT Goals(current goals can now be found in the care plan section)  Progress towards OT goals: Progressing toward goals  Acute Rehab OT Goals Patient Stated Goal: decrease pain OT Goal Formulation: With patient Time For Goal Achievement: 09/26/22 Potential to Achieve Goals: Good ADL Goals Pt Will Perform Grooming: with min assist;sitting Pt Will Perform Upper Body Dressing: with min assist;sitting Pt Will Perform Lower Body Dressing: with mod assist;sit to/from stand Pt Will Transfer to Toilet: with mod assist;stand pivot transfer;bedside commode Additional ADL Goal #1: Pt will follow 3 step command to complete ADL task Additional ADL Goal #2: Pt will identify 3+ fall prevention strategies for use in the home setting.  Plan Discharge plan needs to be updated    Co-evaluation                 AM-PAC OT "6 Clicks" Daily Activity     Outcome Measure   Help from another person eating meals?: A Little Help from another person taking care of personal grooming?: A Little Help from another person toileting, which includes using toliet, bedpan, or urinal?: Total Help from another person bathing (including washing, rinsing, drying)?: A Lot Help from another person to put on and taking off regular upper body clothing?: A Lot Help from another person to put on and taking off regular lower body clothing?: Total 6 Click Score: 12    End of Session    OT Visit Diagnosis: Unsteadiness on feet (R26.81);Muscle weakness (generalized) (M62.81);Other symptoms and signs involving cognitive function;Dizziness and giddiness (R42)   Activity Tolerance Patient tolerated treatment well;Patient limited by pain   Patient  Left in bed;with family/visitor present;Other (comment) (with PT)   Nurse Communication Patient requests pain meds        Time: 5035-4656 OT Time Calculation (min): 44 min  Charges: OT General Charges $OT Visit: 1 Visit OT Treatments $Therapeutic Activity: 23-37 mins  Bradd Canary, OTR/L Acute Rehab Services Office: [phone]   Lorre Munroe 09/16/2022, 1:14 PM

## 2022-09-16 NOTE — Progress Notes (Addendum)
Patient has had two medium bowel moments with frank red blood. Follow up hemoglobin ordered. Rathore MD notified

## 2022-09-16 NOTE — Progress Notes (Signed)
Pharmacy Antibiotic Note  Albert Harris is a 67 y.o. 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 Now with MRSA bacteremia. Noted patient is ESRD HD TTS PTA, with a right arm AV graft and a catheter in his right IJ. Pharmacy consulted to dose daptomycin due to enterococcus faecium and staph aureus growing from tissue cultures from 4/3.  Planning to keep on TTS schedule - will schedule Daptomycin accordingly. Received HD on 4/9 afternoon/evening but Daptomycin not given post HD - will give a make-up dose now   Plan: - Daptomycin 600 mg x 1 dose now then resume Daptomycin 600mg  (8 mg/kg) IV post HD on Tues/Thurs and 750 mg (10 m/gk) IV post HD on Saturdays - Will continue to follow HD schedule/duration, culture results, LOT, and antibiotic de-escalation plans   Height: 5\' 8"  (172.7 cm) Weight: 74.8 kg (164 lb 14.5 oz) IBW/kg (Calculated) : 68.4  Temp (24hrs), Avg:97.8 F (36.6 C), Min:97.4 F (36.3 C), Max:98.2 F (36.8 C)  Recent Labs  Lab 09/12/22 1528 09/13/22 0303 09/13/22 0304 09/13/22 1606 09/14/22 0720 09/15/22 0231 09/16/22 0701  WBC 17.4* 19.3*  --  16.9* 20.1*  --  11.7*  CREATININE 2.85* 3.71* 3.68* 4.51* 5.23* 6.18*  --      Estimated Creatinine Clearance: 11.1 mL/min (A) (by C-G formula based on SCr of 6.18 mg/dL (H)).    Allergies  Allergen Reactions   Benadryl [Diphenhydramine] Rash   Lidocaine-Prilocaine Rash    Caused red marks up and down arm   Antimicrobials this admission:  Cefazolin 3/23 x 1 Vancomycin 3/24 >> 4/5 Zosyn 4/3 >>4/3 Daptomycin 4/5>>  Microbiology results:  4/3 L groin wound: MSRA + VRE 4/3 MRSA PCR: neg 3/27 Bcx: neg 3/25 Bcx: MRSA 3/23 Bcx: MRSA   Thank you for allowing pharmacy to be a part of this patient's care.  Georgina Pillion, PharmD, BCPS Infectious Diseases Clinical Pharmacist 09/16/2022 7:38 AM   **Pharmacist phone directory can now be found on [email or web address] ([email or web address]).  Listed under [facility].

## 2022-09-16 NOTE — Progress Notes (Signed)
Physical Therapy Treatment Patient Details Name: Albert Harris MRN: [id number] DOB: January 22, 1956 Today's Date: 09/16/2022   History of Present Illness 67 year old male  ER with abdominal pain, nausea, diaphoresis and unable to feel his Lt foot. Hypotensive with lactic acidosis in ER.  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 CRRT 3/22-3/26.  3/22 repair of distal Lt external iliac artery. On 3/30, Hgb drop from 9 to 7 with worsening abdominal pain, CT abd 3/30 shows slightly larger anterior pelvic extraperitoneal hematoma. PMHx: PAD, ESRD HD, recently hospitalized 3/16-18 for acute on chronic left lower extremity ischemia.    PT Comments    Pt working with OT on entry tilted to 40 degrees in tilt bed and tolerating well after initial drop in BP and recovery. Pt much more alert and able to participate in therapy today. Pt would like to get back to walking. Discussed use of tilt bed to come to standing with decreased hip flexion. Pt agreeable. Tilted to 60 degrees and pt feet began to slide out from under him and he was unable to self correct. Tilted back down and able to don pt shoes. Brought bed back to 60 degrees, pt's feet did not slide but increased pain in L toes required pt to be brought back to supine. Increase pain with doffing shoes. PT messaged Vascular surgeon PA for order of forefoot offloading shoe to provide the traction needed with no weightbearing through the toes. Pt goals have been reviewed and updated. Patient will benefit from continued inpatient follow up therapy, <3 hours/day.  PT will continue to follow acutely.   Recommendations for follow up therapy are one component of a multi-disciplinary discharge planning process, led by the attending physician.  Recommendations may be updated based on patient status, additional functional criteria and insurance authorization.  Follow Up Recommendations  Can patient physically be transported by private vehicle:  No    Assistance Recommended at Discharge Frequent or constant Supervision/Assistance  Patient can return home with the following Assist for transportation;Two people to help with walking and/or transfers;Two people to help with bathing/dressing/bathroom;Assistance with cooking/housework;Help with stairs or ramp for entrance   Equipment Recommendations  Other (comment) (TBD)       Precautions / Restrictions Precautions Precautions: Fall Precaution Comments: L groin wound vac; try to keep L hip in neutral position per vascular surgeon Restrictions Weight Bearing Restrictions: No LLE Weight Bearing: Weight bearing as tolerated Other Position/Activity Restrictions: decreased L hip flexion           Cognition Arousal/Alertness: Awake/alert Behavior During Therapy: WFL for tasks assessed/performed Overall Cognitive Status: Within Functional Limits for tasks assessed                                          Exercises Other Exercises Other Exercises: quad activation in tilting Other Exercises: modified sit ups with bedrails in tilting Other Exercises: reaching across abdomen in tilting    General Comments General comments (skin integrity, edema, etc.): daugher at bedside      Pertinent Vitals/Pain Pain Assessment Pain Assessment: Faces Faces Pain Scale: Hurts even more Pain Location: L toes w/ shoe doffing, ankle dorsiflexion Pain Descriptors / Indicators: Grimacing, Guarding, Sore Pain Intervention(s): Limited activity within patient's tolerance, Monitored during session, Repositioned, Patient requesting pain meds-RN notified     PT Goals (current goals can now be found in the care plan  section) Acute Rehab PT Goals PT Goal Formulation: With patient/family Time For Goal Achievement: 09/30/22 Potential to Achieve Goals: Fair Progress towards PT goals: Progressing toward goals    Frequency    Min 1X/week      PT Plan Discharge plan needs to be  updated    Co-evaluation PT/OT/SLP Co-Evaluation/Treatment: Yes Reason for Co-Treatment: Complexity of the patient's impairments (multi-system involvement);For patient/therapist safety;To address functional/ADL transfers PT goals addressed during session: Mobility/safety with mobility;Balance        AM-PAC PT "6 Clicks" Mobility   Outcome Measure  Help needed turning from your back to your side while in a flat bed without using bedrails?: A Little Help needed moving from lying on your back to sitting on the side of a flat bed without using bedrails?: Total Help needed moving to and from a bed to a chair (including a wheelchair)?: Total Help needed standing up from a chair using your arms (e.g., wheelchair or bedside chair)?: Total Help needed to walk in hospital room?: Total Help needed climbing 3-5 steps with a railing? : Total 6 Click Score: 8    End of Session Equipment Utilized During Treatment: Other (comment) (tilt bed) Activity Tolerance: Patient limited by pain Patient left: in bed;with call bell/phone within reach Nurse Communication: Mobility status;Patient requests pain meds PT Visit Diagnosis: Other abnormalities of gait and mobility (R26.89);Muscle weakness (generalized) (M62.81);Difficulty in walking, not elsewhere classified (R26.2)     Time: 0321-2248 PT Time Calculation (min) (ACUTE ONLY): 40 min  Charges:  $Therapeutic Activity: 23-37 mins                     [name] B. Beverely Risen PT, DPT Acute Rehabilitation Services Please use secure chat or  Call Office [phone]    [name] 09/16/2022, 1:55 PM

## 2022-09-16 NOTE — Progress Notes (Signed)
  Progress Note    09/16/2022 7:44 AM 1 Day Post-Op  Subjective:  sitting up trying to eat breakfast   Vitals:   09/16/22 0600 09/16/22 0700  BP: (!) 163/63 (!) 163/75  Pulse: 81   Resp: 16 18  Temp:    SpO2: 90% 95%   Physical Exam: Cardiac:  regular Lungs:  non labored Incisions:  left groin with VAC with good seal Extremities:  doppler pero and PT signals, faint. Ischemic changes to left 4th and 5th toes Abdomen:  soft, mildly tender in LLQ Neurologic: alert and oriented  CBC    Component Value Date/Time   WBC 11.7 (H) 09/16/2022 0701   RBC 2.70 (L) 09/16/2022 0701   HGB 8.0 (L) 09/16/2022 0701   HGB 10.4 (L) 06/18/2021 1431   HCT 24.6 (L) 09/16/2022 0701   HCT 31.6 (L) 06/18/2021 1431   PLT 178 09/16/2022 0701   PLT 142 (L) 06/18/2021 1431   MCV 91.1 09/16/2022 0701   MCV 96 06/18/2021 1431   MCH 29.6 09/16/2022 0701   MCHC 32.5 09/16/2022 0701   RDW 17.9 (H) 09/16/2022 0701   RDW 14.3 06/18/2021 1431   LYMPHSABS 1.5 08/28/2022 1810   LYMPHSABS 1.5 06/18/2021 1431   MONOABS 0.7 08/28/2022 1810   EOSABS 0.1 08/28/2022 1810   EOSABS 1.0 (H) 06/18/2021 1431   BASOSABS 0.1 08/28/2022 1810   BASOSABS 0.1 06/18/2021 1431    BMET    Component Value Date/Time   NA 136 09/15/2022 0231   NA 141 06/18/2021 1431   K 4.9 09/15/2022 0231   CL 102 09/15/2022 0231   CO2 19 (L) 09/15/2022 0231   GLUCOSE 92 09/15/2022 0231   BUN 59 (H) 09/15/2022 0231   BUN 20 06/18/2021 1431   CREATININE 6.18 (H) 09/15/2022 0231   CREATININE 1.15 02/19/2017 1049   CALCIUM 7.7 (L) 09/15/2022 0231   CALCIUM 8.5 (L) 06/10/2020 0900   GFRNONAA 9 (L) 09/15/2022 0231   GFRAA 6 (L) 03/04/2020 1045    INR    Component Value Date/Time   INR 1.2 08/12/2022 1501     Intake/Output Summary (Last 24 hours) at 09/16/2022 0744 Last data filed at 09/16/2022 0200 Gross per 24 hour  Intake 858.96 ml  Output 850 ml  Net 8.96 ml    Assessment/Plan:  67 y.o. male is s/p iliofemoral  bypass, removal of infected patch with sartorius muscle flap  1 Day Post-Op   Left groin with VAC to suction. Good seal. Minimal SS output <50 cc LLE perfusion unchanged/ Faint PT/Pero signal. Ischemic changes to 4th and 5th toes Plan for VAC change tomorrow Hgb 8.0 this morning. Transfuse per primary team <7 Had C scope yesterday. Findings noted Cx grew MRSA. Remains on IV Cubicin Will likely need left AKA at some point Appreciate CCM and GI assistance   Graceann Congress, [place] [facility] [phone] 09/16/2022 7:44 AM

## 2022-09-16 NOTE — Progress Notes (Signed)
  Progress Note    09/16/2022 10:33 AM 1 Day Post-Op  Subjective:  sitting up trying to eat breakfast   Vitals:   09/16/22 0900 09/16/22 0941  BP: (!) 169/69 (!) 179/57  Pulse: 73   Resp: (!) 32 20  Temp:  98.4 F (36.9 C)  SpO2: 100%    Physical Exam: Cardiac:  regular Lungs:  non labored Incisions:  left groin with VAC with good seal Extremities:  doppler pero and PT signals, faint. Ischemic changes to left 4th and 5th toes Abdomen:  soft, mildly tender in LLQ Neurologic: alert and oriented  CBC    Component Value Date/Time   WBC 11.7 (H) 09/16/2022 0701   RBC 2.70 (L) 09/16/2022 0701   HGB 8.0 (L) 09/16/2022 0701   HGB 10.4 (L) 06/18/2021 1431   HCT 24.6 (L) 09/16/2022 0701   HCT 31.6 (L) 06/18/2021 1431   PLT 178 09/16/2022 0701   PLT 142 (L) 06/18/2021 1431   MCV 91.1 09/16/2022 0701   MCV 96 06/18/2021 1431   MCH 29.6 09/16/2022 0701   MCHC 32.5 09/16/2022 0701   RDW 17.9 (H) 09/16/2022 0701   RDW 14.3 06/18/2021 1431   LYMPHSABS 1.5 08/28/2022 1810   LYMPHSABS 1.5 06/18/2021 1431   MONOABS 0.7 08/28/2022 1810   EOSABS 0.1 08/28/2022 1810   EOSABS 1.0 (H) 06/18/2021 1431   BASOSABS 0.1 08/28/2022 1810   BASOSABS 0.1 06/18/2021 1431    BMET    Component Value Date/Time   NA 135 09/16/2022 0701   NA 141 06/18/2021 1431   K 3.6 09/16/2022 0701   CL 100 09/16/2022 0701   CO2 26 09/16/2022 0701   GLUCOSE 92 09/16/2022 0701   BUN 33 (H) 09/16/2022 0701   BUN 20 06/18/2021 1431   CREATININE 4.46 (H) 09/16/2022 0701   CREATININE 1.15 02/19/2017 1049   CALCIUM 7.8 (L) 09/16/2022 0701   CALCIUM 8.5 (L) 06/10/2020 0900   GFRNONAA 14 (L) 09/16/2022 0701   GFRAA 6 (L) 03/04/2020 1045    INR    Component Value Date/Time   INR 1.2 08/12/2022 1501     Intake/Output Summary (Last 24 hours) at 09/16/2022 1033 Last data filed at 09/16/2022 0900 Gross per 24 hour  Intake 681.5 ml  Output 900 ml  Net -218.5 ml     Assessment/Plan:  67 y.o. male  is s/p iliofemoral bypass, removal of infected patch with sartorius muscle flap  1 Day Post-Op   Left groin with VAC to suction. Good seal. Minimal SS output <50 cc LLE perfusion unchanged/ Faint PT/Pero signal. Ischemic changes to 4th and 5th toes Plan for VAC change tomorrow Hgb 8.0 this morning. Transfuse per primary team <7 Had C scope yesterday. Findings noted Cx grew MRSA. Remains on IV Cubicin Will likely need left AKA at some point Appreciate CCM and GI assistance   Victorino Sparrow, PA-C [facility] [phone] 09/16/2022 10:33 AM  VASCULAR STAFF ADDENDUM: I have independently interviewed and examined the patient. I agree with the above.  Overall appears to be improving. WBC trending down. Dialysis going well.  No more bleeding events.  Left leg is viable, but is nonfunctional. Aware he may need an amputation on the road. Plan for [facility] change tomorrow bedside Appreciate CCM, GI, and nursing.  Fara Olden, MD [facility] [address] Phone Number: [phone] 09/16/2022 10:33 AM

## 2022-09-16 NOTE — Progress Notes (Signed)
Overnight event  Informed by RN that patient had 2 medium size bowel movements with frank red blood.  Repeat labs done and hemoglobin 7.8.  Patient is not tachycardic and blood pressure elevated with systolic in the 170s.    GI note reviewed: "Colonoscopy 09/15/2022: 2 large ulcers in distal rectum, clip placed. Ulcers and ascending colon, cecum, ileocecal valve, biopsied. Diverticulosis in descending and sigmoid colon. Inflamed mucosa in sigmoid colon, biopsied. 2 mm polyp in distal transverse colon, resected. Nonbleeding internal hemorrhoids."   Will continue to monitor very closely and speak to GI tonight if bleeding is recurrent or signs of hemodynamically instability.  Will repeat CBC in the morning and transfuse PRBCs if hemoglobin less than 7.  Addendum/update: Patient had 2 additional episodes of large-volume frank red blood per rectum with clots.  His blood pressure dropped to systolic in the 60s.  Patient seen and examined at bedside.  Awake and alert, complaining of dizziness and left lower quadrant abdominal pain.  Lungs clear to auscultation.  Has left lower quadrant abdominal tenderness on exam.  He was given 500 cc IV fluids after which systolic improved to 90s.  Repeat hemoglobin 6.5 and 2 units PRBCs ordered urgently.  He is on aspirin 81 mg daily due to acute stroke and PVD, hold aspirin at this time.  Patient complaining of left lower quadrant abdominal pain and did undergo left common femoral artery pseudoaneurysm repair on 3/23, ?recurrent retroperitoneal hemorrhage. Critical care consulted and came to bedside, appreciate assistance.  Patient is being transferred to the ICU.  I spoke to Dr. Rhea Belton with GI, planning on flexible sigmoidoscopy at bedside early this morning versus CTA depending on hemodynamic stability.

## 2022-09-16 NOTE — Progress Notes (Signed)
Orthopedic Tech Progress Note Patient Details:  Albert Harris 25-Jun-1955 [id number] Left forefoot offloading darco shoe was delivered to the patient's room.  Ortho Devices Type of Ortho Device: Darco shoe Ortho Device/Splint Location: LLE Ortho Device/Splint Interventions: Ordered      Albert Harris 09/16/2022, 3:38 PM

## 2022-09-17 ENCOUNTER — Encounter (HOSPITAL_COMMUNITY)
Admission: EM | Disposition: A | Discharge status: Hospice | Payer: Self-pay | Source: Home / Self Care | Attending: Internal Medicine

## 2022-09-17 ENCOUNTER — Inpatient Hospital Stay (HOSPITAL_COMMUNITY): Payer: Medicare Other | Admitting: Certified Registered Nurse Anesthetist

## 2022-09-17 DIAGNOSIS — J449 Chronic obstructive pulmonary disease, unspecified: Secondary | ICD-10-CM | POA: Diagnosis not present

## 2022-09-17 DIAGNOSIS — K649 Unspecified hemorrhoids: Secondary | ICD-10-CM | POA: Diagnosis not present

## 2022-09-17 DIAGNOSIS — K5730 Diverticulosis of large intestine without perforation or abscess without bleeding: Secondary | ICD-10-CM | POA: Diagnosis not present

## 2022-09-17 DIAGNOSIS — K5731 Diverticulosis of large intestine without perforation or abscess with bleeding: Secondary | ICD-10-CM

## 2022-09-17 DIAGNOSIS — A419 Sepsis, unspecified organism: Secondary | ICD-10-CM | POA: Diagnosis not present

## 2022-09-17 DIAGNOSIS — K625 Hemorrhage of anus and rectum: Secondary | ICD-10-CM

## 2022-09-17 DIAGNOSIS — Z87891 Personal history of nicotine dependence: Secondary | ICD-10-CM

## 2022-09-17 DIAGNOSIS — K626 Ulcer of anus and rectum: Secondary | ICD-10-CM

## 2022-09-17 DIAGNOSIS — R6521 Severe sepsis with septic shock: Secondary | ICD-10-CM | POA: Diagnosis not present

## 2022-09-17 HISTORY — PX: HEMOSTASIS CONTROL: SHX6838

## 2022-09-17 HISTORY — PX: SUBMUCOSAL INJECTION: SHX5543

## 2022-09-17 HISTORY — PX: HOT HEMOSTASIS: SHX5433

## 2022-09-17 HISTORY — PX: FLEXIBLE SIGMOIDOSCOPY: SHX5431

## 2022-09-17 LAB — RENAL FUNCTION PANEL
Albumin: 1.6 g/dL — ABNORMAL LOW (ref 3.5–5.0)
Anion gap: 12 (ref 5–15)
BUN: 44 mg/dL — ABNORMAL HIGH (ref 8–23)
CO2: 23 mmol/L (ref 22–32)
Calcium: 8.4 mg/dL — ABNORMAL LOW (ref 8.9–10.3)
Chloride: 101 mmol/L (ref 98–111)
Creatinine, Ser: 5.6 mg/dL — ABNORMAL HIGH (ref 0.61–1.24)
GFR, Estimated: 10 mL/min — ABNORMAL LOW (ref >60)
Glucose, Bld: 95 mg/dL (ref 70–99)
Phosphorus: 5.6 mg/dL — ABNORMAL HIGH (ref 2.5–4.6)
Potassium: 3.7 mmol/L (ref 3.5–5.1)
Sodium: 136 mmol/L (ref 135–145)

## 2022-09-17 LAB — CBC
HCT: 19.6 % — ABNORMAL LOW (ref 39.0–52.0)
HCT: 28.3 % — ABNORMAL LOW (ref 39.0–52.0)
HCT: 26.8 % — ABNORMAL LOW (ref 39.0–52.0)
HCT: 26.2 % — ABNORMAL LOW (ref 39.0–52.0)
Hemoglobin: 6.5 g/dL — CL (ref 13.0–17.0)
Hemoglobin: 9.5 g/dL — ABNORMAL LOW (ref 13.0–17.0)
Hemoglobin: 9.1 g/dL — ABNORMAL LOW (ref 13.0–17.0)
Hemoglobin: 9 g/dL — ABNORMAL LOW (ref 13.0–17.0)
MCH: 30.2 pg (ref 26.0–34.0)
MCH: 30.5 pg (ref 26.0–34.0)
MCH: 30.5 pg (ref 26.0–34.0)
MCH: 31.1 pg (ref 26.0–34.0)
MCHC: 33.2 g/dL (ref 30.0–36.0)
MCHC: 33.6 g/dL (ref 30.0–36.0)
MCHC: 34 g/dL (ref 30.0–36.0)
MCHC: 34.4 g/dL (ref 30.0–36.0)
MCV: 91.2 fL (ref 80.0–100.0)
MCV: 91 fL (ref 80.0–100.0)
MCV: 89.9 fL (ref 80.0–100.0)
MCV: 90.7 fL (ref 80.0–100.0)
Platelets: 174 K/uL (ref 150–400)
Platelets: 165 K/uL (ref 150–400)
Platelets: 158 K/uL (ref 150–400)
Platelets: 158 10*3/uL (ref 150–400)
RBC: 2.15 MIL/uL — ABNORMAL LOW (ref 4.22–5.81)
RBC: 3.11 MIL/uL — ABNORMAL LOW (ref 4.22–5.81)
RBC: 2.98 MIL/uL — ABNORMAL LOW (ref 4.22–5.81)
RBC: 2.89 MIL/uL — ABNORMAL LOW (ref 4.22–5.81)
RDW: 18 % — ABNORMAL HIGH (ref 11.5–15.5)
RDW: 16.7 % — ABNORMAL HIGH (ref 11.5–15.5)
RDW: 17.2 % — ABNORMAL HIGH (ref 11.5–15.5)
RDW: 18 % — ABNORMAL HIGH (ref 11.5–15.5)
WBC: 12 K/uL — ABNORMAL HIGH (ref 4.0–10.5)
WBC: 15.1 K/uL — ABNORMAL HIGH (ref 4.0–10.5)
WBC: 13.9 K/uL — ABNORMAL HIGH (ref 4.0–10.5)
WBC: 14.1 10*3/uL — ABNORMAL HIGH (ref 4.0–10.5)
nRBC: 0 % (ref 0.0–0.2)
nRBC: 0 % (ref 0.0–0.2)
nRBC: 0 % (ref 0.0–0.2)
nRBC: 0 % (ref 0.0–0.2)

## 2022-09-17 LAB — TYPE AND SCREEN
Antibody Screen: NEGATIVE
Unit division: 0
Unit division: 0

## 2022-09-17 LAB — BPAM RBC
Blood Product Expiration Date: 202405082359
ISSUE DATE / TIME: 202404110229

## 2022-09-17 LAB — PREPARE RBC (CROSSMATCH)

## 2022-09-17 SURGERY — SIGMOIDOSCOPY, FLEXIBLE
Anesthesia: Monitor Anesthesia Care

## 2022-09-17 MED ORDER — SODIUM CHLORIDE 0.9 % IV SOLN: INTRAVENOUS | Status: DC

## 2022-09-17 MED ORDER — NOREPINEPHRINE 4 MG/250ML-% IV SOLN
2.0000 ug/min | INTRAVENOUS | Status: DC
  Filled 2022-09-17 (×2): qty 250

## 2022-09-17 MED ORDER — PHENYLEPHRINE 80 MCG/ML (10ML) SYRINGE FOR IV PUSH (FOR BLOOD PRESSURE SUPPORT)
PREFILLED_SYRINGE | INTRAVENOUS | Status: DC | PRN
  Administered 2022-09-17: 160 ug via INTRAVENOUS

## 2022-09-17 MED ORDER — SODIUM CHLORIDE 0.9 % IV BOLUS: 1000.0000 mL | Freq: Once | INTRAVENOUS | Status: DC | PRN

## 2022-09-17 MED ORDER — HYDROMORPHONE HCL 1 MG/ML IJ SOLN
0.5000 mg | Freq: Once | INTRAMUSCULAR | Status: AC
  Administered 2022-09-17: 0.5 mg via INTRAVENOUS

## 2022-09-17 MED ORDER — PHENYLEPHRINE HCL-NACL 20-0.9 MG/250ML-% IV SOLN
INTRAVENOUS | Status: DC | PRN
  Administered 2022-09-17: 20 ug/min via INTRAVENOUS

## 2022-09-17 MED ORDER — SODIUM CHLORIDE (PF) 0.9 % IJ SOLN
PREFILLED_SYRINGE | INTRAMUSCULAR | Status: DC | PRN
  Administered 2022-09-17: 3 mL

## 2022-09-17 MED ORDER — PROPOFOL 500 MG/50ML IV EMUL
INTRAVENOUS | Status: DC | PRN
  Administered 2022-09-17: 10 mg via INTRAVENOUS
  Administered 2022-09-17: 100 ug/kg/min via INTRAVENOUS

## 2022-09-17 MED ORDER — SUCRALFATE 1 GM/10ML PO SUSP
2.0000 g | Freq: Two times a day (BID) | ORAL | Status: DC
  Administered 2022-09-17 – 2022-09-21 (×8): 2 g via RECTAL
  Filled 2022-09-17 (×10): qty 20

## 2022-09-17 MED ORDER — SODIUM CHLORIDE 0.9 % IV SOLN: 250.0000 mL | INTRAVENOUS | Status: DC

## 2022-09-17 MED ORDER — SODIUM CHLORIDE 0.9% IV SOLUTION: Freq: Once | INTRAVENOUS | Status: AC

## 2022-09-17 MED ORDER — HYDROMORPHONE HCL 1 MG/ML IJ SOLN
1.0000 mg | Freq: Once | INTRAMUSCULAR | Status: AC
  Administered 2022-09-17: 1 mg via INTRAVENOUS
  Filled 2022-09-17: qty 1

## 2022-09-17 MED ORDER — LIDOCAINE 2% (20 MG/ML) 5 ML SYRINGE
INTRAMUSCULAR | Status: DC | PRN
  Administered 2022-09-17: 20 mg via INTRAVENOUS

## 2022-09-17 MED ORDER — SODIUM CHLORIDE 0.9 % IV BOLUS: 500.0000 mL | Freq: Once | INTRAVENOUS | Status: DC | PRN

## 2022-09-17 NOTE — Progress Notes (Signed)
eLink Physician-Brief Progress Note Patient Name: Albert Harris DOB: 03/30/1956 MRN: [id number]   Date of Service  09/17/2022  HPI/Events of Note  Patient with recurrent GI bleeding, acute blood loss anemia, and hypotension transferred to the ICU for closer monitoring, he is complaining of abdominal pain unrelieved by 0.5 mg of Dilaudid.  eICU Interventions  New Patient Evaluation. Dilaudid 1 mg iv ordered x 1.        Thomasene Lot Bernetta Sutley 09/17/2022, 3:41 AM

## 2022-09-17 NOTE — Progress Notes (Signed)
Pt receives out-pt HD at [facility] SW on TTS. Pt arrives at 10:45 am for 11:05 chair time. Clinic confirms that they have iv dapto in-stock. Will assist as needed.   Olivia Canter Renal Navigator [phone]

## 2022-09-17 NOTE — Progress Notes (Signed)
New patient from 35m transferred to room 36. Patient is a/o x4. Mae's x4. Left side more painful with incision and wound vac in place. Dressings intact to both feet on the heals and left groin  and left thigh. MP shows NSR. No sticks in the right arm. Vascath in right upper chest capped. Patient is on room air.

## 2022-09-17 NOTE — Significant Event (Signed)
Rapid Response Event Note   Reason for Call : Hypotension in the setting of acute blood loss  Initial Focused Assessment:  Notified of pt with BPs 60s after passing 2 recent large BMs with clots. Pt alert, oriented to person, place and situation. Skin pale, cool and dry. Pt c/o pain to Left lower quadrant with cramping. Palpable peripheral pulses. Cap refill> 3 secs.   0210-98.74F, HR 78, 63/42 (50), RR 18 sats 97% RA  0240-97.37F, 92/75 (82), HR 79, RR 20 sats 100 RA  Interventions:  -NS bolus 500cc stat (ESRD) -2 units PRBCs en route for rapid administration -tx 2M06      MD Notified: Dr Loney Loh at bedside.  Call Time: 0200 Arrival Time: 0220 End Time: 0315  Rose Fillers, RN

## 2022-09-17 NOTE — Progress Notes (Signed)
   09/17/22 1800  Vitals  Temp (!) 97.5 F (36.4 C)  Temp Source Oral  BP (!) 92/50  MAP (mmHg) 65  BP Location Left Arm  Patient Position (if appropriate) Lying  Pulse Rate 82  ECG Heart Rate 83  Resp 15  Oxygen Therapy  SpO2 98 %  Post Treatment  Dialyzer Clearance Heavily streaked  Duration of HD Treatment -hour(s) 3 hour(s)  Hemodialysis Intake (mL) 0 mL  Liters Processed 53.7  Fluid Removed (mL) 0.3 mL  Tolerated HD Treatment Yes  Post-Hemodialysis Comments tx ended 30 minutes early to prevent loss of blood due to clotting of system  Note  Observations pt is c/o pain primary RN made aware  Hemodialysis Catheter Right Internal jugular Double lumen Permanent (Tunneled)  Placement Date/Time: 09/07/22 1352   Placed prior to admission: No  Serial / Lot #: [id number]  Expiration Date: 03/10/27  Time Out: Correct patient;Correct site;Correct procedure  Maximum sterile barrier precautions: Hand hygiene;Cap;Mask;Large steri...  Site Condition No complications  Blue Lumen Status Heparin locked  Red Lumen Status Heparin locked  Catheter fill solution Heparin 1000 units/ml  Catheter fill volume (Arterial) 1.6 cc  Catheter fill volume (Venous) 1.6  Dressing Type Transparent  Dressing Status Antimicrobial disc in place  Drainage Description None  Post treatment catheter status Capped and Clamped   Received patient in bed to unit.  Alert and oriented.  Informed consent signed and in chart.   TX duration:3 hours  Patient tolerated well.  Transported back to the room  Alert, without acute distress.  Hand-off given to patient's nurse.   Access used: rt hd catheter Access issues: yes at the end clotting noted   Total UF removed: 0.47ml Medication(s) given: none Post HD VS: see above Post HD weight: 69.5kg   [facility]

## 2022-09-17 NOTE — Progress Notes (Signed)
Started BT at 0246H. Marland Kitchen HD patient. Regulate BT at 120 ml/hr per L. Gleason. PA-C. Informed Raoul Pitch, Charity fundraiser.

## 2022-09-17 NOTE — TOC Progression Note (Signed)
Transition of Care [facility]) - Progression Note    Patient Details  Name: Albert Harris MRN: [id number] Date of Birth: [birth year]  [facility] CM/SW Contact  Lorri Frederick, LCSW Phone Number: 09/17/2022, 11:15 AM  Clinical Narrative:   Bed offers provided to daughter [name] with medicare choice document.  [name] will get with the family to review.      Expected Discharge Plan: Skilled Nursing Facility Barriers to Discharge: Continued Medical Work up  Expected Discharge Plan and Services In-house Referral: Clinical Social Work   Post Acute Care Choice: Skilled Nursing Facility Living arrangements for the past 2 months: Single Family Home                                       Social Determinants of Health (SDOH) Interventions SDOH Screenings   Food Insecurity: Food Insecurity Present (08/22/2022)  Housing: Low Risk  (08/22/2022)  Transportation Needs: Unmet Transportation Needs (08/22/2022)  Utilities: Not At Risk (08/22/2022)  Alcohol Screen: Low Risk  (09/12/2019)  Depression (PHQ2-9): High Risk (05/07/2022)  Financial Resource Strain: Low Risk  (09/12/2019)  Stress: No Stress Concern Present (09/12/2019)  Tobacco Use: Medium Risk (09/15/2022)    Readmission Risk Interventions    08/24/2022   11:27 AM 08/14/2022   10:07 AM 07/24/2022   11:52 AM  Readmission Risk Prevention Plan  Transportation Screening Complete Complete Complete  Medication Review (RN Care Manager) Referral to Pharmacy Referral to Pharmacy Complete  PCP or Specialist appointment within 3-5 days of discharge  Complete Complete  HRI or Home Care Consult Complete Patient refused Complete  SW Recovery Care/Counseling Consult Complete Complete Patient refused  Palliative Care Screening Not Applicable Not Applicable Not Applicable  Skilled Nursing Facility Not Applicable Not Applicable Not Applicable

## 2022-09-17 NOTE — Progress Notes (Signed)
PT Cancellation Note  Patient Details Name: CAMPBELL EDMISTON MRN: [id number] DOB: 1955-10-07   Cancelled Treatment:    Reason Eval/Treat Not Completed: Patient at procedure or test/unavailable - HD, PT to check back later.   Marye Round, PT DPT Acute Rehabilitation Services Pager [phone]  Office [phone]    Truddie Coco 09/17/2022, 3:46 PM

## 2022-09-17 NOTE — Progress Notes (Signed)
  Progress Note    09/17/2022 3:11 PM Day of Surgery  Subjective:  stated he has had better days    Vitals:   09/17/22 1445 09/17/22 1500  BP: (!) 169/157 135/72  Pulse: 70 72  Resp: 14 15  Temp:    SpO2: 98% 99%   Physical Exam: Cardiac:  regular Lungs:  non labored Incisions:  left groin with VAC with good seal Extremities:  doppler pero and PT signals, faint. Ischemic changes to left 4th and 5th toes Abdomen:  soft, mildly tender in LLQ Neurologic: alert and oriented  CBC    Component Value Date/Time   WBC 13.9 (H) 09/17/2022 1133   RBC 2.98 (L) 09/17/2022 1133   HGB 9.1 (L) 09/17/2022 1133   HGB 10.4 (L) 06/18/2021 1431   HCT 26.8 (L) 09/17/2022 1133   HCT 31.6 (L) 06/18/2021 1431   PLT 158 09/17/2022 1133   PLT 142 (L) 06/18/2021 1431   MCV 89.9 09/17/2022 1133   MCV 96 06/18/2021 1431   MCH 30.5 09/17/2022 1133   MCHC 34.0 09/17/2022 1133   RDW 17.2 (H) 09/17/2022 1133   RDW 14.3 06/18/2021 1431   LYMPHSABS 1.5 08/28/2022 1810   LYMPHSABS 1.5 06/18/2021 1431   MONOABS 0.7 08/28/2022 1810   EOSABS 0.1 08/28/2022 1810   EOSABS 1.0 (H) 06/18/2021 1431   BASOSABS 0.1 08/28/2022 1810   BASOSABS 0.1 06/18/2021 1431    BMET    Component Value Date/Time   NA 136 09/17/2022 0139   NA 141 06/18/2021 1431   K 3.7 09/17/2022 0139   CL 101 09/17/2022 0139   CO2 23 09/17/2022 0139   GLUCOSE 95 09/17/2022 0139   BUN 44 (H) 09/17/2022 0139   BUN 20 06/18/2021 1431   CREATININE 5.60 (H) 09/17/2022 0139   CREATININE 1.15 02/19/2017 1049   CALCIUM 8.4 (L) 09/17/2022 0139   CALCIUM 8.5 (L) 06/10/2020 0900   GFRNONAA 10 (L) 09/17/2022 0139   GFRAA 6 (L) 03/04/2020 1045    INR    Component Value Date/Time   INR 1.2 08/12/2022 1501     Intake/Output Summary (Last 24 hours) at 09/17/2022 1511 Last data filed at 09/17/2022 1400 Gross per 24 hour  Intake 825.43 ml  Output 100 ml  Net 725.43 ml     Assessment/Plan:  67 y.o. male is s/p iliofemoral  bypass, removal of infected patch with sartorius muscle flap  Day of Surgery   Left groin VAC changed. Small local debridement of devitalized tissue. No signs of bleeding  S/p colonscopy - no active bleeding, one rectal ulcer injected and cauterized.  Left leg is viable, but is nonfunctional. Aware he may need an amputation on the road. Plan for VAC change M/Th Appreciate CCM, GI, and nursing.   Fara Olden, MD [name] Phone Number: [phone] 09/17/2022 3:11 PM

## 2022-09-17 NOTE — Anesthesia Postprocedure Evaluation (Signed)
Anesthesia Post Note  Patient: Albert Harris  Procedure(s) Performed: FLEXIBLE SIGMOIDOSCOPY HEMOSTASIS CONTROL SUBMUCOSAL INJECTION HOT HEMOSTASIS (ARGON PLASMA COAGULATION/BICAP)     Patient location during evaluation: PACU Anesthesia Type: MAC Level of consciousness: awake and alert Pain management: pain level controlled Vital Signs Assessment: post-procedure vital signs reviewed and stable Respiratory status: spontaneous breathing, nonlabored ventilation and respiratory function stable Cardiovascular status: blood pressure returned to baseline Postop Assessment: no apparent nausea or vomiting Anesthetic complications: no   No notable events documented.  Last Vitals:  Vitals:   09/17/22 1020 09/17/22 1140  BP: (!) 106/55   Pulse: 77   Resp: (!) 23   Temp:  36.6 C  SpO2: 96%     Last Pain:  Vitals:   09/17/22 1140  TempSrc: Oral  PainSc:                  Shanda Howells

## 2022-09-17 NOTE — Progress Notes (Signed)
         Patient off the floor for GI procedure today  Mosetta Pigeon PA-C

## 2022-09-17 NOTE — Transfer of Care (Signed)
Immediate Anesthesia Transfer of Care Note  Patient: Albert Harris  Procedure(s) Performed: FLEXIBLE SIGMOIDOSCOPY HEMOSTASIS CONTROL SUBMUCOSAL INJECTION HOT HEMOSTASIS (ARGON PLASMA COAGULATION/BICAP)  Patient Location: PACU  Anesthesia Type:MAC  Level of Consciousness: awake, alert , and oriented  Airway & Oxygen Therapy: Patient Spontanous Breathing and Patient connected to face mask oxygen  Post-op Assessment: Report given to RN and Post -op Vital signs reviewed and stable  Post vital signs: Reviewed and stable  Last Vitals:  Vitals Value Taken Time  BP    Temp    Pulse    Resp    SpO2      Last Pain:  Vitals:   09/17/22 0815  TempSrc: Oral  PainSc: 10-Worst pain ever      Patients Stated Pain Goal: 4 (09/17/22 0348)  Complications: No notable events documented.

## 2022-09-17 NOTE — Op Note (Signed)
[facility] Patient Name: Albert LeydenClyde Harris Procedure Date : 09/17/2022 MRN: [id number] Attending MD: Dub AmisScott E. Tomasa Randunningham , MD, [phone] Date of Birth: 03/16/1956 CSN: [id number] Age: [age] Admit Type: Inpatient Procedure:                Flexible Sigmoidoscopy Indications:              Rectal hemorrhage Providers:                Lorin PicketScott E. Tomasa Randunningham, MD, Delton Prairieylan Smith, RN, Kandice RobinsonsGuillaume                            [place], Technician Referring MD:              Medicines:                Monitored Anesthesia Care Complications:            No immediate complications. Estimated Blood Loss:     Estimated blood loss was minimal. Procedure:                Pre-Anesthesia Assessment:                           - Prior to the procedure, a History and Physical                            was performed, and patient medications and                            allergies were reviewed. [name] tolerance of                            previous anesthesia was also reviewed. The risks                            and benefits of the procedure and the sedation                            options and risks were discussed with the patient.                            All questions were answered, and informed consent                            was obtained. Prior Anticoagulants: The patient has                            taken no anticoagulant or antiplatelet agents                            except for aspirin. ASA Grade Assessment: III - A                            patient with severe systemic disease. After  reviewing the risks and benefits, the patient was                            deemed in satisfactory condition to undergo the                            procedure.                           After obtaining informed consent, the scope was                            passed under direct vision. The Therapeutic EGD                            [id number] ([id number]) was introduced  through the                            anus and advanced to the the sigmoid colon. The                            flexible sigmoidoscopy was accomplished without                            difficulty. The patient tolerated the procedure                            well. The quality of the bowel preparation was poor. Scope In: 9:14:40 AM Scope Out: 9:39:24 AM Total Procedure Duration: 0 hours 24 minutes 44 seconds  Findings:      Hemorrhoids were found on perianal exam.      Copious red blood was found in the rectum and in the sigmoid colon. The       colon was advanced to 30 cm before stopping because of excessive stool       and poor visibility; red blood was seen throughout      Two large ulcers again were found in the distal rectum, on opposite       sides of the rectum. No active bleeding was present. On the smaller       ulcer, a hemostatic clip was seen which had been placed 2 days prior.       There were no bleeding stigmata around this clip. The smaller ulcer did       not have bleeding stigmata. On the larger ulcer, there was a visible       vessel and adjacent small pigmented spot (not seen on colonoscopy 2 days       prior). The area around the visible vessel was successfully injected       with 3 mL of a 0.1 mg/mL solution of epinephrine in a 4-quadrant       fashion. Following injection the entire ulcer bed blanched and appeared       friable. Coagulation for hemostasis using bipolar probe applied to the       vessel was successful. 2 mL of Purestat (hemostatic gel was applied to       this ulcer and then 1 mL was applied to the smaller ulcer.  Both ulcers       beds were able to be completely covered with the gel. There was no       bleeding at the end of the procedure.      Multiple small-mouthed diverticula were found in the sigmoid colon. Impression:               - Preparation of the colon was poor and                            visualization past 30 cm was not  achieved.                           - Hemorrhoids found on perianal exam.                           - Two large distal ulcers in the distal rectum.                            Injected. Treated with bipolar cautery. Hemostatic                            gel applied.                           - Diverticulosis in the sigmoid colon.                           - No specimens collected.                           - I suspect that the recurrent bleed was from the                            larger ulcer which was treated, although given the                            blood seen in the sigmoid colon also makes                            diverticular source possible. The patient has large                            rectal ulcers which will continue to be a potential                            source of bleeding. Given the size of the ulcers                            definitive hemostasis will not be possible with                            endoscopic therapy. but focal bleeding sites can be  treated as needed, as was done today and 2 days ago.                           - Etiology of rectal ulcers unclear, but Crohn's                            and ischemia on differential Moderate Sedation:      N/A Recommendation:           - Return patient to ICU for ongoing care.                           - Clear liquid diet today                           - Start carafate enemas BID tonight (2 grams                            carafate dissolved in 40 mL tap water) to help                            promote ulcer healing/cytoprotection                           - Hold any blood thinners/anticoagulants                           - Colace 100 mg BID to avoid hard stools Procedure Code(s):        --- Professional ---                           [phone], Sigmoidoscopy, flexible; with control of                            bleeding, any method Diagnosis Code(s):        --- Professional ---                            K64.9, Unspecified hemorrhoids                           K62.6, Ulcer of anus and rectum                           K57.30, Diverticulosis of large intestine without                            perforation or abscess without bleeding CPT copyright 2022 American Medical Association. All rights reserved. The codes documented in this report are preliminary and upon coder review may  be revised to meet current compliance requirements. Josealberto Montalto E. Tomasa Rand, MD 09/17/2022 10:10:32 AM This report has been signed electronically. Number of Addenda: 0

## 2022-09-17 NOTE — Anesthesia Procedure Notes (Signed)
Procedure Name: MAC Date/Time: 09/17/2022 8:56 AM  Performed by: Jodell Cipro, CRNAPre-anesthesia Checklist: Patient identified, Emergency Drugs available, Suction available, Patient being monitored and Timeout performed Patient Re-evaluated:Patient Re-evaluated prior to induction Oxygen Delivery Method: Simple face mask Placement Confirmation: positive ETCO2 Dental Injury: Teeth and Oropharynx as per pre-operative assessment

## 2022-09-17 NOTE — Progress Notes (Signed)
NAME:  Albert Harris, MRN:  [id number], DOB:  1955-11-28, LOS: 20 ADMISSION DATE:  08/28/2022, CONSULTATION DATE:  08/28/22 REFERRING MD: Constance Goltz , CHIEF COMPLAINT:  Abdominal pain  History of Present Illness:  67 yo male brought to ER with abdominal pain, nausea, diaphoresis and unable to feel his Lt foot.  Hypotensive with lactic acidosis in ER.  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  Hx of ESRD and had hyperkalemia.  Had emergent CRRT.  Vascular consulted and taken to OR.    Pertinent  Medical History  ESRD -AV fistula (no flow tonight), PAD, COPD, HLD, HTN, HFrEF 35-40%, PAD  Significant Hospital Events: Including procedures, antibiotic start and stop dates in addition to other pertinent events   3/22 Admit, emergent CRRT, to OR for repair of distal Lt external iliac artery 3/24 Extubated. CT head obtained for altered mental status did not show any acute abnormalities 3/26 Off CRRT and pressors, Vascular surgery consulted for clotted AVG 3/27 AV graft thrombectomy attempted in OR but patient became hemodynamically unstable and procedure was aborted.  Was on pressors briefly 3/28 dialysis followed by CVC and aline removal for line holiday 3/29 MRI brain ordered for LLE weakness and found with right subinsular 23mm infarct. Neuro consulted 3/30 CT A/P increased hematoma in left pelvic region associated with Hg drop 9>7 4/1 hgb stable, TEE, new tunneled line planned 4/3 L groin bleeding. Taken to OR urgently by VVS for iliofemoral bypass, removal of infected patch, sartorius flap.  4/6, 4/7 ongoing rectal bleeding. Transfused both days.  4/6 flex sig with multiple non-bleeding ulcers.  4/8 ongoing rectal bleeding. Transfusing again. Did not drink bowel prep for colonoscopy. Aborted.  PCCM called back.  4/11 two large bloody BM's overnight with Hgb drop from 7.8 to 6.5 with hypotension, moved back to ICU and transferred   Interim History / Subjective:  Two  large episodes of hematochezia overnight with hypotension Increased L flank pain which resolved after transfer to ICU  Objective   Blood pressure (!) 94/59, pulse 77, temperature 97.6 F (36.4 C), temperature source Oral, resp. rate 10, height 5\' 8"  (1.727 m), weight 74.8 kg, SpO2 99 %.        Intake/Output Summary (Last 24 hours) at 09/17/2022 5681 Last data filed at 09/17/2022 0315 Gross per 24 hour  Intake 457 ml  Output 50 ml  Net 407 ml    Filed Weights   09/15/22 0500 09/15/22 0836 09/16/22 0700  Weight: 74.5 kg 74.5 kg 74.8 kg    General:  chronically and acutely ill-appearing M in mild distress from pain HEENT: MM pale/moist,  Neuro: alert and oriented CV: s1s2 rrr, no m/r/g PULM:  clear bilaterally on RA GI: soft, L flank TTP, thigh compartment soft, L groin wound vac in place Extremities: warm/dry, no edema  Skin: no rashes or lesions     Resolved Hospital Problem list   Septic shock Acute respiratory failure with hypoxia  Assessment & Plan:   Acute blood loss anemia secondary to hematochezia Acute lower GI bleeding Flex sig 4/6 with 5 non-bleeding ulcers identified He underwent colonoscopy which showed multiple rectal ulcers, one of the ulcers showed visible bleeding vessel, clips applied and hemorrhoids -repeated episodes of hematochezia with hypotension overnight prompting return to ICU, Hgb 6.5 -transfused 2 units -hospitalist spoke with GI with will repeat flex sigmoid at the bedside in the AM -Monitor H&H and transfuse if hemoglobin less than 8   Retroperitoneal hemorrhage, stable  Initially had increased L flank pain, however resolved  Peripheral vascular disease Lt common femoral artery pseudoaneurysm s/p Lt groin hematoma evacuation, primary repair of distal external iliac artery. Further complicated 4/3 by L groin bleeding and R/p bleed with new pseudoaneurysm and aterial thrombus. Now s/p  iliofemoral bypass, removal of infected patch, sartorius  flap.  -Postop care per vascular surgery -Continue wound VAC  MRSA bacteremia 2/2 post-op wound infection with MRSA and VRE TEE negative for veg Surgical cultures grew MRSA and E. Faecium/VRE [facility] 3/27 negative -Continue daptomycin on dialysis days -Infectious disease following  ESRD Hyponatremia, improved -Nephrology is following -HD per nephrology via RIJ tunneled catheter.  -Closely monitor electrolytes  Chronic systolic CHF with EF 35 to 40% on Echo from 06/29/22. Hx of pulmonary hypertension. -Continue to hold toprol, plavix for now, hold norvasc   Acute metabolic encephalopathy from uremia, acidosis. Acute 62mm right subinsular stroke  -CT head on 3/24 was normal -Mental status has improved  Moderate malnutrition -Continue dietary supplements, npo tonight  Best Practice (right click and "Reselect all SmartList Selections" daily)   Diet/type: Resume NPO DVT prophylaxis: SCD GI prophylaxis: PPI. Lines: Dialysis Catheter tunneled  Foley:  N/A Code Status:  full code Last date of multidisciplinary goals of care discussion [pending]   This patient is critically ill with multiple organ system failure which requires frequent high complexity decision making, assessment, support, evaluation, and titration of therapies. This was completed through the application of advanced monitoring technologies and extensive interpretation of multiple databases.  During this encounter critical care time was devoted to patient care services described in this note for 32 minutes.  Darcella Gasman Pattiann Solanki, PA-C [facility] See Amion for pager If no response to pager , please call [phone] until 7pm After 7:00 pm call Elink  [phone]

## 2022-09-17 NOTE — Anesthesia Preprocedure Evaluation (Signed)
Anesthesia Evaluation  Patient identified by MRN, date of birth, ID band Patient awake    Reviewed: Allergy & Precautions, NPO status , Patient's Chart, lab work & pertinent test results, reviewed documented beta blocker date and time   History of Anesthesia Complications Negative for: history of anesthetic complications  Airway Mallampati: II  TM Distance: >3 FB Neck ROM: Full    Dental  (+) Edentulous Upper, Missing,    Pulmonary asthma , COPD,  COPD inhaler, former smoker   Pulmonary exam normal        Cardiovascular hypertension, Pt. on medications and Pt. on home beta blockers + Peripheral Vascular Disease (on Plavix) and +CHF  Normal cardiovascular exam  TTE 09/07/22: EF 50-55%, severe LVH, moderate LAE, moderate RAE/LAE, mild MR, mild AS   Neuro/Psych CVA  negative psych ROS   GI/Hepatic Neg liver ROS, PUD,GERD  ,,  Endo/Other  negative endocrine ROS    Renal/GU ESRF and DialysisRenal disease (last HD 09/15/22)  negative genitourinary   Musculoskeletal negative musculoskeletal ROS (+)    Abdominal   Peds  Hematology  (+) Blood dyscrasia (Hgb 9.5), anemia   Anesthesia Other Findings Day of surgery medications reviewed with patient.  Reproductive/Obstetrics negative OB ROS                              Anesthesia Physical Anesthesia Plan  ASA: 3  Anesthesia Plan: MAC   Post-op Pain Management: Minimal or no pain anticipated   Induction:   PONV Risk Score and Plan: Treatment may vary due to age or medical condition and Propofol infusion  Airway Management Planned: Natural Airway and Nasal Cannula  Additional Equipment: None  Intra-op Plan:   Post-operative Plan:   Informed Consent: I have reviewed the patients History and Physical, chart, labs and discussed the procedure including the risks, benefits and alternatives for the proposed anesthesia with the patient or authorized  representative who has indicated his/her understanding and acceptance.       Plan Discussed with: CRNA  Anesthesia Plan Comments:          Anesthesia Quick Evaluation

## 2022-09-17 NOTE — Progress Notes (Signed)
[facility] Progress Note  Subjective:  Back to ICU overnight with recurrent/worsening hematochezia, anemia, and hypotension Flex Sig this AM with large distal ulcers injected and cauterized Not on pressors For HD today Family at bedside  Vitals:   09/17/22 0957 09/17/22 1000 09/17/22 1010 09/17/22 1020  BP: (!) 190/62 (!) 93/47 102/61 (!) 106/55  Pulse: 66 70 72 77  Resp: (!) 24 17 15  (!) 23  Temp:      TempSrc:      SpO2: 98% 100% 99% 96%  Weight:      Height:        Exam: Constitutional: lying in bed, alert  CV: normal rate, no edema Respiratory: bilateral chest rise, no iwob Gastrointestinal: soft, nonended Psych: mood and affect appropriate  Access: Right upper extremity AVG, no bruit or thrill  L fem wound covered w/ dressing   L foot a bit discolored    OP HD: TTS SW  4h   350/800  64.5kg  RFA AVG= clotted/ new TDC in place   Heparin none  - last OP HD 3/14, post wt 65.9kg - venofer 50mg  IV weekly - rocaltrol 0.25 mcg po tiw - phoslo 2 ac tid - no esa, last Hb 11.0  Summary: presented 08/21/22 w/ acute pain/ ischemic L foot. Hx of L EIA stenting on 3/04, and prior RLE bypass. Pt underwent endarterectomy of the left common femoral, profundofemoral, superficial femoral and external iliac vessels with bovine pericardial patch angioplasty and a left iliofemoral and profundofemoral thromboembolectomy on 3/15. Was in ICU until 3/18 for hypotension. DC'd home on 3/18. Pt then returned on 3/22 to ED w/ N/V and unable to feel his L foot. BP's low in ED and was found to have L fem pseudoaneurysm. Had emergent CRRT for high K+. Then taken to OR 3/23 for primary repair of the distal EIA w/ hematoma evacuation. Extubated 3/24. BCx's from  admit were + for MRSA, IV vanc was started. On 3/26 was off CRRT and off pressors. R arm AVG was clotted and went for declot on 3/27 per VVS but graft re-clotted. Pt had regular HD on 3/28 then temp cath was removed for line holiday.  IR placed new TDC on 4/01 and pt had regular HD later the same day. Otherwise as below.    Assessment/ Plan: Left common femoral artery pseudoaneurysm - sp repair of L EIA on 3/23. Went back to OR 4/3 for L CIA to profunda bypass, L CIA pta and stenting, and muscle flap (sartorious to groin). Still high risk of limb loss.   MRSA Bacteremia, VRE tissue cx - sp line holiday and getting IV daptomycin per ID/Pharmacy x 6wk, then suppressive therapy.  CVA: +stroke on MRI. Neuro signed off.  ESRD: TTS HD.  SP CRRT 3/23- 3/26. Had HD 3/28, then the temp cath was removed for line holiday. On 4/1 VVS placed TDC and is now back on TTS schedule. Next HD today 4/11 Volume - min edema LE's, 2L O2, soft BP's, doubt sig vol excess Anemia: Hb 7s from ongoing LGIB.  S/p CSY 09/15/22, Flex Sig 4/11.  Several bleeding ulcers addressed with endoscopy.  No heparin with HD. Inc'd ESA to higher dose.  Transfuse prn.  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 Cont po vdra and phoslo 2 ac tid as binder.  Dialysis access: AVG clotted 3/27, went for declot by VVS and then re-clotted. Temp cath removed 3/28. VVS placed new RIJ TDC on 4/1. Using [facility] with frequent  clotting.  Can't use heparin. Try again today, frequent saline flushes, limp along as able.    Arita Miss, MD  [facility] 09/17/2022, 11:20 AM  Recent Labs  Lab 09/16/22 0701 09/16/22 2022 09/17/22 0139 09/17/22 0628  HGB 8.0*   < > 6.5* 9.5*  ALBUMIN 1.8*  --  1.6*  --   CALCIUM 7.8*  --  8.4*  --   PHOS 5.3*  --  5.6*  --   CREATININE 4.46*  --  5.60*  --   K 3.6  --  3.7  --    < > = values in this interval not displayed.    No results for input(s): "IRON", "TIBC", "FERRITIN" in the last 168 hours. Inpatient medications:  (feeding supplement) PROSource Plus  30 mL Oral TID BM   acetaminophen  650 mg Oral Q6H   calcitRIOL  0.25 mcg Oral Q T,Th,Sa-HD   calcium acetate  1,334 mg Oral TID WC   Chlorhexidine Gluconate Cloth  6 each Topical Q0600   darbepoetin  (ARANESP) injection - DIALYSIS  200 mcg Subcutaneous Q Wed-1800   feeding supplement (NEPRO CARB STEADY)  237 mL Oral TID BM   leptospermum manuka honey  1 Application Topical Daily   liver oil-zinc oxide   Topical TID   methocarbamol  500 mg Oral TID   multivitamin  1 tablet Oral QHS   pantoprazole  40 mg Oral Daily   polyethylene glycol  17 g Oral BID   rosuvastatin  10 mg Oral Daily   sodium chloride flush  10-40 mL Intracatheter Q12H   sucralfate  2 g Rectal BID    sodium chloride Stopped (09/17/22 0805)   DAPTOmycin (CUBICIN) 600 mg in sodium chloride 0.9 % IVPB Stopped (09/16/22 0837)   [START ON 09/19/2022] DAPTOmycin (CUBICIN) 750 mg in sodium chloride 0.9 % IVPB     norepinephrine (LEVOPHED) Adult infusion     sodium chloride     alteplase, dicyclomine, docusate sodium, heparin, hydrALAZINE, HYDROmorphone (DILAUDID) injection, mouth rinse, oxyCODONE, pentafluoroprop-tetrafluoroeth, sodium chloride, sodium chloride flush

## 2022-09-17 NOTE — Interval H&P Note (Signed)
History and Physical Interval Note:  09/17/2022 8:13 AM  Albert Harris  has presented today for surgery, with the diagnosis of Lower GI bleeding.  The various methods of treatment have been discussed with the patient and family. After consideration of risks, benefits and other options for treatment, the patient has consented to  Procedure(s): FLEXIBLE SIGMOIDOSCOPY (N/A) as a surgical intervention.  [name] history has been reviewed, patient examined, no change in status, stable for surgery.  I have reviewed the patient's chart and labs.  Questions were answered to the patient's satisfaction.    Patient had significant lower GI bleeding overnight hypotension/hemorrhagic shock.  He was treated with IV fluids and pRBCs and his blood pressures improved.  His hgb dropped to 6.  He did not require pressors.  His bleeding has slowed down in the past few hours and his systolics are in the 100s.  Jenel Lucks

## 2022-09-17 NOTE — Progress Notes (Signed)
Patient is voiding large hematomas along with large amounts of frank red blood. Patient is A&O x4, vitals are stable. Patient states he has pain in the LUQ and he feels that another hematoma is coming. Hgb order is in. Rathore MD notified.

## 2022-09-18 ENCOUNTER — Encounter (HOSPITAL_COMMUNITY): Payer: Self-pay | Admitting: Gastroenterology

## 2022-09-18 DIAGNOSIS — R6521 Severe sepsis with septic shock: Secondary | ICD-10-CM | POA: Diagnosis not present

## 2022-09-18 DIAGNOSIS — A419 Sepsis, unspecified organism: Secondary | ICD-10-CM | POA: Diagnosis not present

## 2022-09-18 LAB — RENAL FUNCTION PANEL
Albumin: 1.7 g/dL — ABNORMAL LOW (ref 3.5–5.0)
Anion gap: 15 (ref 5–15)
BUN: 27 mg/dL — ABNORMAL HIGH (ref 8–23)
CO2: 23 mmol/L (ref 22–32)
Calcium: 8.2 mg/dL — ABNORMAL LOW (ref 8.9–10.3)
Chloride: 98 mmol/L (ref 98–111)
Creatinine, Ser: 3.85 mg/dL — ABNORMAL HIGH (ref 0.61–1.24)
GFR, Estimated: 16 mL/min — ABNORMAL LOW (ref >60)
Glucose, Bld: 83 mg/dL (ref 70–99)
Phosphorus: 3.8 mg/dL (ref 2.5–4.6)
Potassium: 3.7 mmol/L (ref 3.5–5.1)
Sodium: 136 mmol/L (ref 135–145)

## 2022-09-18 LAB — CBC
HCT: 27.1 % — ABNORMAL LOW (ref 39.0–52.0)
HCT: 25.7 % — ABNORMAL LOW (ref 39.0–52.0)
HCT: 25.1 % — ABNORMAL LOW (ref 39.0–52.0)
Hemoglobin: 9.3 g/dL — ABNORMAL LOW (ref 13.0–17.0)
Hemoglobin: 8.5 g/dL — ABNORMAL LOW (ref 13.0–17.0)
Hemoglobin: 8.2 g/dL — ABNORMAL LOW (ref 13.0–17.0)
MCH: 31.4 pg (ref 26.0–34.0)
MCH: 30.4 pg (ref 26.0–34.0)
MCH: 30.6 pg (ref 26.0–34.0)
MCHC: 34.3 g/dL (ref 30.0–36.0)
MCHC: 33.1 g/dL (ref 30.0–36.0)
MCHC: 32.7 g/dL (ref 30.0–36.0)
MCV: 91.6 fL (ref 80.0–100.0)
MCV: 91.8 fL (ref 80.0–100.0)
MCV: 93.7 fL (ref 80.0–100.0)
Platelets: 174 10*3/uL (ref 150–400)
Platelets: 183 10*3/uL (ref 150–400)
Platelets: 183 10*3/uL (ref 150–400)
RBC: 2.96 MIL/uL — ABNORMAL LOW (ref 4.22–5.81)
RBC: 2.8 MIL/uL — ABNORMAL LOW (ref 4.22–5.81)
RBC: 2.68 MIL/uL — ABNORMAL LOW (ref 4.22–5.81)
RDW: 18 % — ABNORMAL HIGH (ref 11.5–15.5)
RDW: 18.1 % — ABNORMAL HIGH (ref 11.5–15.5)
RDW: 18.5 % — ABNORMAL HIGH (ref 11.5–15.5)
WBC: 13 10*3/uL — ABNORMAL HIGH (ref 4.0–10.5)
WBC: 13.5 10*3/uL — ABNORMAL HIGH (ref 4.0–10.5)
WBC: 14.3 10*3/uL — ABNORMAL HIGH (ref 4.0–10.5)
nRBC: 0 % (ref 0.0–0.2)
nRBC: 0 % (ref 0.0–0.2)
nRBC: 0 % (ref 0.0–0.2)

## 2022-09-18 LAB — BPAM RBC
Blood Product Expiration Date: 202405052359
Blood Product Expiration Date: 202405052359
ISSUE DATE / TIME: 202404110229
Unit Type and Rh: 5100
Unit Type and Rh: 5100
Unit Type and Rh: 5100

## 2022-09-18 LAB — TYPE AND SCREEN: Unit division: 0

## 2022-09-18 MED ORDER — CARVEDILOL 3.125 MG PO TABS
3.1250 mg | ORAL_TABLET | Freq: Two times a day (BID) | ORAL | Status: DC
  Administered 2022-09-18 – 2022-09-23 (×9): 3.125 mg via ORAL
  Filled 2022-09-18 (×10): qty 1

## 2022-09-18 MED ORDER — CHLORHEXIDINE GLUCONATE CLOTH 2 % EX PADS
6.0000 | MEDICATED_PAD | Freq: Every day | CUTANEOUS | Status: DC
  Administered 2022-09-18 – 2022-09-23 (×6): 6 via TOPICAL

## 2022-09-18 MED ORDER — TRAMADOL HCL 50 MG PO TABS
50.0000 mg | ORAL_TABLET | Freq: Four times a day (QID) | ORAL | Status: DC | PRN
  Administered 2022-09-18 – 2022-10-17 (×10): 50 mg via ORAL
  Filled 2022-09-18 (×10): qty 1

## 2022-09-18 NOTE — Progress Notes (Signed)
Progress Note  Primary GI: unassigned  LOS: 21 days   Chief Complaint: Rectal bleeding   Subjective  Patient states he is doing well this morning.  Has had no further episodes of rectal bleeding.  Denies abdominal pain, nausea, vomiting.  Patient with family at bedside, daughter. Provided some of the history.   Objective   Vital signs in last 24 hours: Temp:  [97.5 F (36.4 C)-98.1 F (36.7 C)] 97.8 F (36.6 C) (04/12 0800) Pulse Rate:  [68-88] 69 (04/12 0800) Resp:  [11-19] 16 (04/12 0800) BP: (92-169)/(49-157) 159/69 (04/12 0800) SpO2:  [93 %-100 %] 100 % (04/12 0800) Weight:  [69.5 kg-73.5 kg] 73.5 kg (04/12 0500) Last BM Date : 09/17/22 Last BM recorded by nurses in past 5 days Stool Type: Type 7 (Liquid consistency with no solid pieces) (09/17/2022  6:11 PM)  General:   male in no acute distress  Heart:  Regular rate and rhythm; no murmurs Pulm: Clear anteriorly; no wheezing Abdomen: soft, nondistended, normal bowel sounds in all quadrants. Nontender without guarding. No organomegaly appreciated. Extremities:  No edema Neurologic:  Alert and  oriented x4;  No focal deficits.  Psych:  Cooperative. Normal mood and affect.  Intake/Output from previous day: 04/11 0701 - 04/12 0700 In: 215.9 [I.V.:153.9; IV Piggyback:62] Out: 50.3 [Drains:50] Intake/Output this shift: Total I/O In: 200 [P.O.:200] Out: -   Studies/Results: No results found.  Lab Results: Recent Labs    09/17/22 1133 09/17/22 1933 09/18/22 0609  WBC 13.9* 14.1* 13.0*  HGB 9.1* 9.0* 9.3*  HCT 26.8* 26.2* 27.1*  PLT 158 158 174   BMET Recent Labs    09/16/22 0701 09/17/22 0139 09/18/22 0419  NA 135 136 136  K 3.6 3.7 3.7  CL 100 101 98  CO2 26 23 23   GLUCOSE 92 95 83  BUN 33* 44* 27*  CREATININE 4.46* 5.60* 3.85*  CALCIUM 7.8* 8.4* 8.2*   LFT Recent Labs    09/18/22 0419  ALBUMIN 1.7*   PT/INR No results for input(s): "LABPROT", "INR" in the last 72 hours.   Scheduled  Meds:  (feeding supplement) PROSource Plus  30 mL Oral TID BM   acetaminophen  650 mg Oral Q6H   calcitRIOL  0.25 mcg Oral Q T,Th,Sa-HD   calcium acetate  1,334 mg Oral TID WC   carvedilol  3.125 mg Oral BID WC   Chlorhexidine Gluconate Cloth  6 each Topical Q0600   Chlorhexidine Gluconate Cloth  6 each Topical Q0600   darbepoetin (ARANESP) injection - DIALYSIS  200 mcg Subcutaneous Q Wed-1800   feeding supplement (NEPRO CARB STEADY)  237 mL Oral TID BM   leptospermum manuka honey  1 Application Topical Daily   liver oil-zinc oxide   Topical TID   methocarbamol  500 mg Oral TID   multivitamin  1 tablet Oral QHS   pantoprazole  40 mg Oral Daily   polyethylene glycol  17 g Oral BID   rosuvastatin  10 mg Oral Daily   sodium chloride flush  10-40 mL Intracatheter Q12H   sucralfate  2 g Rectal BID   Continuous Infusions:  sodium chloride Stopped (09/17/22 0805)   DAPTOmycin (CUBICIN) 600 mg in sodium chloride 0.9 % IVPB 124 mL/hr at 09/17/22 2200   [START ON 09/19/2022] DAPTOmycin (CUBICIN) 750 mg in sodium chloride 0.9 % IVPB     sodium chloride        Impression:   Recurrent hematochezia; likely rectal ulcers versus possible diverticular source -  Hgb 9.0, stable -Flex sigmoidoscopy/11: 2 large distal ulcers in distal rectum, injected. treated with bipolar cautery.  Hemostatic gel applied.  Diverticulosis in sigmoid.  No specimens collected -Colonoscopy 09/15/2022: 2 large ulcers in distal rectum, clip placed. Ulcers and ascending colon, cecum, ileocecal valve, biopsied. Diverticulosis in descending and sigmoid colon. Inflamed mucosa in sigmoid colon, biopsied. 2 mm polyp in distal transverse colon, resected. Nonbleeding internal hemorrhoids.   ESRD on dialysis Septic shock MRSA septicemia   Plan:   -Continue Colace 100 mg twice daily or prn to avoid hard stools -No current bleeding at this time.  Continue to monitor for bleeding and check CBC daily and transfuse to keep hemoglobin  above 7  Raymie Giammarco M Eaton Folmar  09/18/2022, 11:42 AM

## 2022-09-18 NOTE — Progress Notes (Signed)
Dr. Beryle Lathe notified of changes in patients comfort level and decreased flow to foot and temperature change and prns not working. Response was nothing we can do he is going to have to have extremity amputated. Give him pain meds.

## 2022-09-18 NOTE — Progress Notes (Signed)
Nutrition Follow-up  DOCUMENTATION CODES:   Non-severe (moderate) malnutrition in context of chronic illness  INTERVENTION:  Education on prioritizing protein at meal times  Continue Nepro TID BM Continue Prosource TID BM (mix with a beverage of his choice to make it taste better)    NUTRITION DIAGNOSIS:   Moderate Malnutrition (in the context of chronic illness (ESRD)) related to poor appetite as evidenced by moderate fat depletion, severe muscle depletion.   GOAL:   Patient will meet greater than or equal to 90% of their needs -ongoing   MONITOR:   PO intake, Supplement acceptance, I & O's, Labs, Weight trends  REASON FOR ASSESSMENT:   Consult, Ventilator Enteral/tube feeding initiation and management (trickle tube feeds)  ASSESSMENT:   67 y.o. male admitted with abdominal pain, nausea, diaphoresis and unable to feel L foot d/t L femoral pseudoaneurysm. Pt was hypotensive with lactic acidosis upon admission. PMH significant for ESRD, PAD, COPD, HLD, HTN, HFrEF 35-40%.  GIB last weekend   4/06 Flex Sig with 5 non-bleeding ulcers 4/07 CTA negative for acute bleeding 4/8 colonoscopy- cancelled due to poor prep  4/9 colonoscopy- 2 large ulcers in rectum  4/11 GIB site cauterized   Labs:  BUN 27, Cr 3.85 Meds: prosource, phoslo, rena-vite, protonix, miralax, crestor, NS, carafate Wt: admit wt- 141#, CBW- 162# PO: NPO/Clliq diet x 2 days due to GIB, 25-50% meal intake x last 8 documented meals  I/O's:  +3.2 L   Visited patient at bedside who denies N/V and has not had a BM yet. He has not been consuming much fluids and states he drinks 1 boost breeze per day along with a prosource although he does not like the taste of them.   Patient's diet changed back to solid food and he reports he is ready to eat and hoping no more challenges arise. He is agreeable to continue drinking nepro ONS again. RD urged him to priortize protein at meals    Diet Order:   Diet Order              Diet renal/carb modified with fluid restriction Fluid restriction: 1200 mL Fluid; Room service appropriate? Yes; Fluid consistency: Thin  Diet effective now                   EDUCATION NEEDS:   Education needs have been addressed  Skin:  Skin Assessment: Skin Integrity Issues: Skin Integrity Issues:: Unstageable, Other (Comment) DTI: R/L heel Stage II: n/a Unstageable: sacrum (DTI that evlolved into unstageable)-hydrotherapy initiated Wound Vac: L groin Incisions: L groin (closed) Other: Buttocks and gluteal crease/upper thighs with scattered areas of partial thickness and full thickness skin loss  Last BM:  4/11  Height:   Ht Readings from Last 1 Encounters:  09/17/22 5\' 8"  (1.727 m)    Weight:   Wt Readings from Last 1 Encounters:  09/18/22 73.5 kg    Ideal Body Weight:  70 kg  BMI:  Body mass index is 24.64 kg/m.  Estimated Nutritional Needs:   Kcal:  1900-2100  Protein:  95-110g/d  Fluid:  1L + UOP    Leodis Rains, RDN, LDN  Clinical Nutrition

## 2022-09-18 NOTE — Progress Notes (Signed)
[facility] Progress Note   Subjective:   Transferred to 5W yesterday. Felt tired post HD but otherwise no complaints. Denies SOB, CP, dizziness, abdominal pain and nausea.   Objective Vitals:   09/18/22 0000 09/18/22 0400 09/18/22 0500 09/18/22 0800  BP: 125/69 (!) 142/58  (!) 159/69  Pulse: 79 73  69  Resp: 15   16  Temp: 97.6 F (36.4 C) 97.6 F (36.4 C)    TempSrc: Oral Oral    SpO2: 98% 100%  100%  Weight:   73.5 kg   Height:       Physical Exam General: Alert male in NAD Heart: RRR, no murmurs, rubs or gallops Lungs: CTA bilaterally Abdomen: Soft, non-distended, +BS Extremities: No edema b/l lower extremities Dialysis Access: TDC in R chest, RUE AVG+ faint bruit  Additional Objective Labs: Basic Metabolic Panel: Recent Labs  Lab 09/16/22 0701 09/17/22 0139 09/18/22 0419  NA 135 136 136  K 3.6 3.7 3.7  CL 100 101 98  CO2 GLUCOSE 92 95 83  BUN 33* 44* 27*  CREATININE 4.46* 5.60* 3.85*  CALCIUM 7.8* 8.4* 8.2*  PHOS 5.3* 5.6* 3.8   Liver Function Tests: Recent Labs  Lab 09/16/22 0701 09/17/22 0139 09/18/22 0419  ALBUMIN 1.8* 1.6* 1.7*   No results for input(s): "LIPASE", "AMYLASE" in the last 168 hours. CBC: Recent Labs  Lab 09/17/22 0139 09/17/22 0628 09/17/22 1133 09/17/22 1933 09/18/22 0609  WBC 12.0* 15.1* 13.9* 14.1* 13.0*  HGB 6.5* 9.5* 9.1* 9.0* 9.3*  HCT 19.6* 28.3* 26.8* 26.2* 27.1*  MCV 91.2 91.0 89.9 90.7 91.6  PLT 174 165 158 158 174   Blood Culture    Component Value Date/Time   SDES TISSUE 09/09/2022 1453   SPECREQUEST VESSEL PT ON VANC ANCEF 09/09/2022 1453   CULT  09/09/2022 1453    RARE STAPHYLOCOCCUS AUREUS SUSCEPTIBILITIES PERFORMED ON PREVIOUS CULTURE WITHIN THE LAST 5 DAYS. NO ANAEROBES ISOLATED Performed at [facility], [address], [place], [place] [address]    REPTSTATUS 09/14/2022 FINAL 09/09/2022 1453    Cardiac Enzymes: Recent Labs  Lab 09/14/22 0720  CKTOTAL 57    CBG: Recent Labs  Lab 09/15/22 0340 09/15/22 0749 09/15/22 1105 09/15/22 1554 09/16/22 0740  GLUCAP 88 88 82 110* 95   Iron Studies: No results for input(s): "IRON", "TIBC", "TRANSFERRIN", "FERRITIN" in the last 72 hours. @ Studies/Results: No results found. Medications:  sodium chloride Stopped (09/17/22 0805)   DAPTOmycin (CUBICIN) 600 mg in sodium chloride 0.9 % IVPB 124 mL/hr at 09/17/22 2200   [START ON 09/19/2022] DAPTOmycin (CUBICIN) 750 mg in sodium chloride 0.9 % IVPB     norepinephrine (LEVOPHED) Adult infusion     sodium chloride      (feeding supplement) PROSource Plus  30 mL Oral TID BM   acetaminophen  650 mg Oral Q6H   calcitRIOL  0.25 mcg Oral Q T,Th,Sa-HD   calcium acetate  1,334 mg Oral TID WC   Chlorhexidine Gluconate Cloth  6 each Topical Q0600   darbepoetin (ARANESP) injection - DIALYSIS  200 mcg Subcutaneous Q Wed-1800   feeding supplement (NEPRO CARB STEADY)  237 mL Oral TID BM   leptospermum manuka honey  1 Application Topical Daily   liver oil-zinc oxide   Topical TID   methocarbamol  500 mg Oral TID   multivitamin  1 tablet Oral QHS   pantoprazole  40 mg Oral Daily   polyethylene glycol  17 g Oral  BID   rosuvastatin  10 mg Oral Daily   sodium chloride flush  10-40 mL Intracatheter Q12H   sucralfate  2 g Rectal BID    Dialysis Orders: TTS SW  4h   350/800  64.5kg  RFA AVG= clotted/ new TDC in place   Heparin none  - last OP HD 3/14, post wt 65.9kg - venofer 50mg  IV weekly - rocaltrol 0.25 mcg po tiw - phoslo 2 ac tid - no esa, last Hb 11.0  Assessment/Plan: Left common femoral artery pseudoaneurysm - sp repair of L EIA on 3/23. Went back to OR 4/3 for L CIA to profunda bypass, L CIA pta and stenting, and muscle flap (sartorious to groin). Still high risk of limb loss.   MRSA Bacteremia, VRE tissue cx - sp line holiday and getting IV daptomycin per ID/Pharmacy x 6wk, then suppressive therapy.  CVA: +stroke on MRI. Neuro signed  off.  ESRD: TTS HD.  SP CRRT 3/23- 3/26. Had HD 3/28, then the temp cath was removed for line holiday. On 4/1 VVS placed TDC and is now back on TTS schedule.  Volume - no edema LE's, on room air. Appears close to euvolemic. Wide variation in weights here, will try to get standing weight when he is able Anemia: Hb dropped from ongoing LGIB.  S/p CSY 09/15/22, Flex Sig 4/11.  Several bleeding ulcers addressed with endoscopy.  No heparin with HD. Inc'd ESA to higher dose.  Hgb now stable in 9's. Transfuse prn.  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 Cont po vdra and phoslo 2 ac tid as binder.  Dialysis access: AVG clotted 3/27, went for declot by VVS and then re-clotted. Temp cath removed 3/28. VVS placed new RIJ TDC on 4/1. Using [facility] with frequent clotting.  Can't use heparin. Try again today, frequent saline flushes, limp along as able.      Rogers Blocker, PA-C 09/18/2022, 8:55 AM  [facility] Pager: [phone]

## 2022-09-18 NOTE — Progress Notes (Signed)
Physical Therapy Treatment Patient Details Name: Albert Harris MRN: [id number] DOB: Oct 28, 1955 Today's Date: 09/18/2022   History of Present Illness 67 year old male  ER with abdominal pain, nausea, diaphoresis and unable to feel his Lt foot. Hypotensive with lactic acidosis in ER.  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 CRRT 3/22-3/26.  3/22 repair of distal Lt external iliac artery. On 3/30, Hgb drop from 9 to 7 with worsening abdominal pain, CT abd 3/30 shows slightly larger anterior pelvic extraperitoneal hematoma. PMHx: PAD, ESRD HD, recently hospitalized 3/16-18 for acute on chronic left lower extremity ischemia.    PT Comments    Patient seen with OT to address tilting in bed to increase standing tolerance to progress towards transfers. Pt able to tolerate up to 65 degrees this session, and pleasant and cooperative throughout. Pt able to complete reaching activities and RLE mobility/therex in tilted position with cues for technique. Located pt shoes and plan to don next session as pt feet sliding on footboard in tilted position. Pt continues to benefit from skilled PT services to progress toward functional mobility goals.    Recommendations for follow up therapy are one component of a multi-disciplinary discharge planning process, led by the attending physician.  Recommendations may be updated based on patient status, additional functional criteria and insurance authorization.  Follow Up Recommendations  Can patient physically be transported by private vehicle: No    Assistance Recommended at Discharge Frequent or constant Supervision/Assistance  Patient can return home with the following Assist for transportation;Two people to help with walking and/or transfers;Two people to help with bathing/dressing/bathroom;Assistance with cooking/housework;Help with stairs or ramp for entrance   Equipment Recommendations  Other (comment) (tbd)    Recommendations for  Other Services       Precautions / Restrictions Precautions Precautions: Fall Precaution Comments: L groin wound vac; try to keep L hip in neutral position per vascular surgeon Restrictions Weight Bearing Restrictions: No LLE Weight Bearing: Weight bearing as tolerated Other Position/Activity Restrictions: decreased L hip flexion     Mobility  Bed Mobility Overal bed mobility: Needs Assistance             General bed mobility comments: Patient remain in supine Angle: 65 degrees (stopping at 30, 40, 50, 60, on the way to 65. able to maintain each angle for ~5 mins, only able to otlerate increased weight bearing at 65* fpr ~2 mins) Total Minutes in Angle: 20 minutes Patient Response: Cooperative  Transfers                        Ambulation/Gait                   Stairs             Wheelchair Mobility    Modified Rankin (Stroke Patients Only)       Balance                                            Cognition Arousal/Alertness: Awake/alert Behavior During Therapy: WFL for tasks assessed/performed Overall Cognitive Status: Within Functional Limits for tasks assessed                         Following Commands: Follows one step commands consistently, Follows one step commands with increased time  General Comments: pleasant and eager to participate        Exercises Other Exercises Other Exercises: quad activation in tilting, bending R knee and straightening Other Exercises: reaching in all planes in tilting    General Comments General comments (skin integrity, edema, etc.): performed reaching tasks and grooming while tilted in bed      Pertinent Vitals/Pain Pain Assessment Pain Assessment: Faces Faces Pain Scale: Hurts little more Pain Location: LLE when donning socks Pain Descriptors / Indicators: Grimacing, Guarding, Sore Pain Intervention(s): Limited activity within patient's tolerance,  Monitored during session    Home Living                          Prior Function            PT Goals (current goals can now be found in the care plan section) Acute Rehab PT Goals PT Goal Formulation: With patient/family Time For Goal Achievement: 09/30/22 Progress towards PT goals: Progressing toward goals    Frequency    Min 1X/week      PT Plan      Co-evaluation PT/OT/SLP Co-Evaluation/Treatment: Yes Reason for Co-Treatment: Complexity of the patient's impairments (multi-system involvement);For patient/therapist safety;To address functional/ADL transfers PT goals addressed during session: Mobility/safety with mobility;Balance OT goals addressed during session: ADL's and self-care;Proper use of Adaptive equipment and DME      AM-PAC PT "6 Clicks" Mobility   Outcome Measure  Help needed turning from your back to your side while in a flat bed without using bedrails?: A Little Help needed moving from lying on your back to sitting on the side of a flat bed without using bedrails?: Total Help needed moving to and from a bed to a chair (including a wheelchair)?: Total Help needed standing up from a chair using your arms (e.g., wheelchair or bedside chair)?: Total Help needed to walk in hospital room?: Total Help needed climbing 3-5 steps with a railing? : Total 6 Click Score: 8    End of Session Equipment Utilized During Treatment: Other (comment) (tilt bed)       PT Visit Diagnosis: Other abnormalities of gait and mobility (R26.89);Muscle weakness (generalized) (M62.81);Difficulty in walking, not elsewhere classified (R26.2)     Time: 6599-3570 PT Time Calculation (min) (ACUTE ONLY): 30 min  Charges:  $Therapeutic Activity: 8-22 mins                     Artelia Game R. PTA Acute Rehabilitation Services Office: [phone]    Catalina Antigua 09/18/2022, 3:27 PM

## 2022-09-18 NOTE — Progress Notes (Signed)
Occupational Therapy Treatment Patient Details Name: Albert Harris MRN: [id number] DOB: 1955-06-11 Today's Date: 09/18/2022   History of present illness 67 year old male  ER with abdominal pain, nausea, diaphoresis and unable to feel his Lt foot. Hypotensive with lactic acidosis in ER.  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 CRRT 3/22-3/26.  3/22 repair of distal Lt external iliac artery. On 3/30, Hgb drop from 9 to 7 with worsening abdominal pain, CT abd 3/30 shows slightly larger anterior pelvic extraperitoneal hematoma. PMHx: PAD, ESRD HD, recently hospitalized 3/16-18 for acute on chronic left lower extremity ischemia.   OT comments  Patient seen with PT to address tilting in bed to increase standing tolerance to progress towards transfers. Patient tolerated up to 65 degrees in tilt bed with patient progressing in intervals. Patient performed grooming and reaching tasks in partial tilt and used BUE for support with tilted at 65 degrees. Patient is motivated towards therapy and has potential for continued gains. Patient will benefit from continued inpatient follow up therapy, <3 hours/day. Acute OT to continue to follow.    Recommendations for follow up therapy are one component of a multi-disciplinary discharge planning process, led by the attending physician.  Recommendations may be updated based on patient status, additional functional criteria and insurance authorization.    Assistance Recommended at Discharge Intermittent Supervision/Assistance  Patient can return home with the following  A lot of help with walking and/or transfers;A lot of help with bathing/dressing/bathroom   Equipment Recommendations  Other (comment) (TBD, pending progress)    Recommendations for Other Services      Precautions / Restrictions Precautions Precautions: Fall Precaution Comments: L groin wound vac; try to keep L hip in neutral position per vascular  surgeon Restrictions Weight Bearing Restrictions: No LLE Weight Bearing: Weight bearing as tolerated Other Position/Activity Restrictions: decreased L hip flexion       Mobility Bed Mobility Overal bed mobility: Needs Assistance             General bed mobility comments: Patient remain in supine    Transfers                         Balance                                           ADL either performed or assessed with clinical judgement   ADL Overall ADL's : Needs assistance/impaired     Grooming: Wash/dry hands;Wash/dry face;Supervision/safety Grooming Details (indicate cue type and reason): performed hand and face hygiene while tilted in bed                               General ADL Comments: focused on tilting in bed to progress towards performing functional transfers    Extremity/Trunk Assessment              Vision       Perception     Praxis      Cognition Arousal/Alertness: Awake/alert Behavior During Therapy: WFL for tasks assessed/performed Overall Cognitive Status: Within Functional Limits for tasks assessed                         Following Commands: Follows one step commands consistently, Follows one step commands with  increased time       General Comments: pleasant and eager to participate        Exercises      Shoulder Instructions       General Comments performed reaching tasks and grooming while tilted in bed    Pertinent Vitals/ Pain       Pain Assessment Pain Assessment: Faces Faces Pain Scale: Hurts little more Pain Location: LLE when donning socks Pain Descriptors / Indicators: Grimacing, Guarding, Sore Pain Intervention(s): Limited activity within patient's tolerance, Monitored during session, Repositioned  Home Living                                          Prior Functioning/Environment              Frequency  Min 2X/week         Progress Toward Goals  OT Goals(current goals can now be found in the care plan section)  Progress towards OT goals: Progressing toward goals  Acute Rehab OT Goals Patient Stated Goal: increase mobility OT Goal Formulation: With patient Time For Goal Achievement: 09/26/22 Potential to Achieve Goals: Good ADL Goals Pt Will Perform Grooming: with min assist;sitting Pt Will Perform Upper Body Dressing: with min assist;sitting Pt Will Perform Lower Body Dressing: with mod assist;sit to/from stand Pt Will Transfer to Toilet: with mod assist;stand pivot transfer;bedside commode Additional ADL Goal #1: Pt will follow 3 step command to complete ADL task Additional ADL Goal #2: Pt will identify 3+ fall prevention strategies for use in the home setting.  Plan Discharge plan needs to be updated    Co-evaluation    PT/OT/SLP Co-Evaluation/Treatment: Yes Reason for Co-Treatment: Complexity of the patient's impairments (multi-system involvement);For patient/therapist safety;To address functional/ADL transfers   OT goals addressed during session: ADL's and self-care;Proper use of Adaptive equipment and DME      AM-PAC OT "6 Clicks" Daily Activity     Outcome Measure   Help from another person eating meals?: A Little Help from another person taking care of personal grooming?: A Little Help from another person toileting, which includes using toliet, bedpan, or urinal?: Total Help from another person bathing (including washing, rinsing, drying)?: A Lot Help from another person to put on and taking off regular upper body clothing?: A Lot Help from another person to put on and taking off regular lower body clothing?: Total 6 Click Score: 12    End of Session Equipment Utilized During Treatment: Other (comment) (tilt bed)  OT Visit Diagnosis: Unsteadiness on feet (R26.81);Muscle weakness (generalized) (M62.81);Other symptoms and signs involving cognitive function;Dizziness and giddiness  (R42)   Activity Tolerance Patient tolerated treatment well   Patient Left in bed;with call bell/phone within reach   Nurse Communication Mobility status        Time: 9622-2979 OT Time Calculation (min): 29 min  Charges: OT General Charges $OT Visit: 1 Visit OT Treatments $Therapeutic Activity: 8-22 mins  Alfonse Flavors, OTA Acute Rehabilitation Services  Office [phone]   Dewain Penning 09/18/2022, 2:25 PM

## 2022-09-18 NOTE — Progress Notes (Signed)
PROGRESS NOTE                                                                                                                                                                                                             Patient Demographics:    Albert Harris, is a 67 y.o. male, DOB - 1955/12/23, ZOX:[id number]  Outpatient Primary MD for the patient is Albert Sessions, NP    LOS - 21  Admit date - 08/28/2022    Chief Complaint  Patient presents with   Abdominal Pain       Brief Narrative (HPI from H&P)   67 yo male brought to ER with abdominal pain, nausea, diaphoresis and unable to feel his Lt foot. Hypotensive with lactic acidosis in ER. 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 Hx of ESRD and had hyperkalemia. Had emergent CRRT. Vascular consulted and taken to OR underwent iliofemoral bypass on the left leg 08/28/22, some bleeding from the left groin and infection this was debrided and a wound VAC was placed by vascular surgery on 09/09/2022.    3/22 Admit, emergent CRRT, to OR for repair of distal Lt external iliac artery 3/24 Extubated. CT head obtained for altered mental status did not show any acute abnormalities 3/26 Off CRRT and pressors, Vascular surgery consulted for clotted AVG 3/27 AV graft thrombectomy attempted in OR but patient became hemodynamically unstable and procedure was aborted.  Was on pressors briefly 3/28 dialysis followed by CVC and aline removal for line holiday 3/29 MRI brain ordered for LLE weakness and found with right subinsular 4mm infarct. Neuro consulted 3/30 CT A/P increased hematoma in left pelvic region associated with Hg drop 9>7 4/1 hgb stable, TEE, new tunneled line planned 4/3 L groin bleeding. Taken to OR urgently by VVS for iliofemoral bypass, removal of infected patch, sartorius flap.  4/6, 4/7 ongoing rectal bleeding. Transfused both days.  4/6 flex sig  with multiple non-bleeding ulcers.  4/9 colonoscopy - 2 large ulcers in the distal rectum with a visible vessel and Hemoclip placed - few ulcers in the ascending colon, cecum, and at the ileocecal valve - hemorrhoids -biopsies sent  4/8 ongoing rectal bleeding. Transfusing again. Did not drink bowel prep for colonoscopy. Aborted.  PCCM called back.  4/11 two large bloody BM's overnight with Hgb drop from  7.8 to 6.5 with hypotension, moved back to ICU and transferred  4/11 seen by GI rectal bleeding site was cauterized bleeding was stabilized 09/18/2022 transferred to my care under TRH on day 22 of his hospital stay    Subjective:    Albert Harris today has, No headache, No chest pain, No abdominal pain - No Nausea, No new weakness tingling or numbness, no cough - SOB   Assessment  & Plan :    Peripheral vascular disease, ischemia of the left leg, Lt common femoral artery pseudoaneurysm seen by vascular surgery Dr. Roxan Hockey s/p iliofemoral bypass this admission, complicated by left groin bleeding and left groin infection.  Left groin infected patch removed has left groin wound VAC.  Received antibiotics.  Infection stable.  Is discussed with vascular surgeon Dr. Roxan Hockey on 09/18/2022 he is not a candidate for any further vascular intervention at all.  Unfortunately has lost all use of left lower extremity at some point it will be amputated.  Currently pain control and medical management only.  Retroperitoneal hemorrhage, stable - Initially had increased L flank pain, however resolved   Acute blood loss anemia secondary to hematochezia, Acute lower GI bleeding with multiple rectal bleeding ulcers. Flex sig 4/6 with 5 non-bleeding ulcers identified, He underwent colonoscopy which showed multiple rectal ulcers, one of the ulcers showed visible bleeding vessel, clips applied and hemorrhoids, repeated episodes of hematochezia with hypotension overnight prompting return to ICU, Hgb 6.5, received 2 units  of packed RBC on 09/16/2022, seen by GI was taken to Endo lab and bleeding sites were cauterized on 09/17/2022 with stabilization of bleeding.  Free bleeding is high, etiology of ulcers appears to be consistent with ischemia most likely considering his history of poor vascular health, continue to monitor H&H.  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 Faecium/VRE. [facility] 3/27 negative. Continue daptomycin on dialysis days, Infectious disease following   ESRD, Hyponatremia, improved  -Nephrology is following, HD per nephrology via RIJ tunneled catheter. TTS    Chronic systolic CHF with EF 35 to 40% on Echo from 06/29/22. Hx of pulmonary hypertension.  Pressure is improved with placed on Coreg, Plavix held due to ongoing GI bleed.    Acute metabolic encephalopathy from uremia, acidosis, Acute 71mm right subinsular stroke -CT head on 3/24 was normal, Mental status has improved, left leg ischemia and loss of function.   Moderate malnutrition -Continue dietary supplements, npo tonight  COPD.  Stable.  Dyslipidemia.  Resume statin.  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  Outpatient follow-up with pulmonary and GI to be arranged by PCP postdischarge.       Condition - Extremely Guarded  Family Communication  :  None present  Code Status :  Full  Consults  :  GI, VVS, ID, PCCM  PUD Prophylaxis : PPI   Procedures  :            Disposition Plan  :    Status is: Inpatient  DVT Prophylaxis  :    SCD's Start: 09/09/22 1841   Lab Results  Component Value Date   PLT 174 09/18/2022    Diet :  Diet Order             Diet clear liquid Room service appropriate? Yes; Fluid consistency: Thin  Diet effective now  Inpatient Medications  Scheduled Meds:  (feeding supplement) PROSource Plus  30 mL Oral TID BM   acetaminophen  650 mg Oral Q6H   calcitRIOL  0.25 mcg Oral Q  T,Th,Sa-HD   calcium acetate  1,334 mg Oral TID WC   Chlorhexidine Gluconate Cloth  6 each Topical Q0600   Chlorhexidine Gluconate Cloth  6 each Topical Q0600   darbepoetin (ARANESP) injection - DIALYSIS  200 mcg Subcutaneous Q Wed-1800   feeding supplement (NEPRO CARB STEADY)  237 mL Oral TID BM   leptospermum manuka honey  1 Application Topical Daily   liver oil-zinc oxide   Topical TID   methocarbamol  500 mg Oral TID   multivitamin  1 tablet Oral QHS   pantoprazole  40 mg Oral Daily   polyethylene glycol  17 g Oral BID   rosuvastatin  10 mg Oral Daily   sodium chloride flush  10-40 mL Intracatheter Q12H   sucralfate  2 g Rectal BID   Continuous Infusions:  sodium chloride Stopped (09/17/22 0805)   DAPTOmycin (CUBICIN) 600 mg in sodium chloride 0.9 % IVPB 124 mL/hr at 09/17/22 2200   [START ON 09/19/2022] DAPTOmycin (CUBICIN) 750 mg in sodium chloride 0.9 % IVPB     sodium chloride     PRN Meds:.alteplase, dicyclomine, docusate sodium, heparin, hydrALAZINE, HYDROmorphone (DILAUDID) injection, mouth rinse, oxyCODONE, pentafluoroprop-tetrafluoroeth, sodium chloride, sodium chloride flush     Objective:   Vitals:   09/18/22 0000 09/18/22 0400 09/18/22 0500 09/18/22 0800  BP: 125/69 (!) 142/58  (!) 159/69  Pulse: 79 73  69  Resp: 15   16  Temp: 97.6 F (36.4 C) 97.6 F (36.4 C)  97.8 F (36.6 C)  TempSrc: Oral Oral  Oral  SpO2: 98% 100%  100%  Weight:   73.5 kg   Height:        Wt Readings from Last 3 Encounters:  09/18/22 73.5 kg  08/23/22 64.1 kg  08/15/22 64.9 kg     Intake/Output Summary (Last 24 hours) at 09/18/2022 0913 Last data filed at 09/18/2022 0500 Gross per 24 hour  Intake 194 ml  Output 50.3 ml  Net 143.7 ml     Physical Exam  Awake Alert, No new F.N deficits, Normal affect [place].AT,PERRAL Supple Neck, No JVD,   Symmetrical Chest wall movement, Good air movement bilaterally, CTAB RRR,No Gallops,Rubs or new Murmurs,  +ve B.Sounds, Abd Soft, No  tenderness,   Left groin wound VAC in place, left lower extremity extremely weak,    RN pressure injury documentation: Pressure Injury 08/28/22 Sacrum Medial Unstageable - Full thickness tissue loss in which the base of the injury is covered by slough (yellow, tan, gray, green or brown) and/or eschar (tan, brown or black) in the wound bed. Sacral wound with eschar coveri (Active)  08/28/22 2228  Location: Sacrum  Location Orientation: Medial  Staging: Unstageable - Full thickness tissue loss in which the base of the injury is covered by slough (yellow, tan, gray, green or brown) and/or eschar (tan, brown or black) in the wound bed.  Wound Description (Comments): Sacral wound with eschar covering wound bed  Present on Admission: Yes  Dressing Type Foam - Lift dressing to assess site every shift;Honey 09/17/22 1915     Pressure Injury 09/04/22 Heel Left Deep Tissue Pressure Injury - Purple or maroon localized area of discolored intact skin or blood-filled blister due to damage of underlying soft tissue from pressure and/or shear. (Active)  09/04/22 1200  Location: Heel  Location Orientation: Left  Staging: Deep Tissue Pressure Injury - Purple or maroon localized area of discolored intact skin or blood-filled blister due to damage of underlying soft tissue from pressure and/or shear.  Wound Description (Comments):   Present on Admission:   Dressing Type Foam - Lift dressing to assess site every shift 09/17/22 1915      Data Review:    Recent Labs  Lab 09/17/22 0139 09/17/22 0628 09/17/22 1133 09/17/22 1933 09/18/22 0609  WBC 12.0* 15.1* 13.9* 14.1* 13.0*  HGB 6.5* 9.5* 9.1* 9.0* 9.3*  HCT 19.6* 28.3* 26.8* 26.2* 27.1*  PLT 174 165 158 158 174  MCV 91.2 91.0 89.9 90.7 91.6  MCH 30.2 30.5 30.5 31.1 31.4  MCHC 33.2 33.6 34.0 34.4 34.3  RDW 18.0* 16.7* 17.2* 18.0* 18.0*    Recent Labs  Lab 09/11/22 1545 09/12/22 0255 09/13/22 0304 09/13/22 1606 09/14/22 0720 09/15/22 0231  09/15/22 1501 09/16/22 0701 09/17/22 0139 09/18/22 0419  NA 134*   < > 133*   < > 134* 136  --  135 136 136  K 3.9   < > 3.9   < > 4.4 4.9  --  3.6 3.7 3.7  CL 98   < > 100   < > 96* 102  --  100 101 98  CO2 24   < > 24   < > 21* 19*  --  26 23 23   ANIONGAP 12   < > 9   < > 17* 15  --  9 12 15   GLUCOSE 111*   < > 86   < > 93 92  --  92 95 83  BUN 52*   < > 34*   < > 47* 59*  --  33* 44* 27*  CREATININE 4.75*   < > 3.68*   < > 5.23* 6.18*  --  4.46* 5.60* 3.85*  ALBUMIN  --    < > 1.8*  --   --  1.7*  --  1.8* 1.6* 1.7*  CRP 11.9*  --   --   --   --   --  10.0*  --   --   --   MG  --   --   --   --  2.1  --   --   --   --   --   CALCIUM 7.6*   < > 7.8*   < > 8.3* 7.7*  --  7.8* 8.4* 8.2*   < > = values in this interval not displayed.      Recent Labs  Lab 09/11/22 1545 09/12/22 0255 09/14/22 0720 09/15/22 0231 09/15/22 1501 09/16/22 0701 09/17/22 0139 09/18/22 0419  CRP 11.9*  --   --   --  10.0*  --   --   --   MG  --   --  2.1  --   --   --   --   --   CALCIUM 7.6*   < > 8.3* 7.7*  --  7.8* 8.4* 8.2*   < > = values in this interval not displayed.   Lab Results  Component Value Date   CHOL 94 09/05/2022   HDL 29 (L) 09/05/2022   LDLCALC 45 09/05/2022   TRIG 102 09/05/2022   CHOLHDL 3.2 09/05/2022    Lab Results  Component Value Date   HGBA1C 5.1 09/05/2022    Radiology Reports No results found.    Signature  -   Susa Raring  M.D on 09/18/2022 at 9:13 AM   -  To page go to [email or web address]

## 2022-09-18 NOTE — Progress Notes (Addendum)
  Progress Note    09/18/2022 10:31 AM 1 Day Post-Op  Subjective:  no major complaints this morning   Vitals:   09/18/22 0400 09/18/22 0800  BP: (!) 142/58 (!) 159/69  Pulse: 73 69  Resp:  16  Temp: 97.6 F (36.4 C) 97.8 F (36.6 C)  SpO2: 100% 100%   Physical Exam: Cardiac:  regular Lungs:  non labored Incisions:  left groin incision with VAC with good seal Extremities: Well perfused and warm with faint doppler PT/Pero signals Abdomen:  mildly tender LLQ Neurologic: alert and oriented  CBC    Component Value Date/Time   WBC 13.0 (H) 09/18/2022 0609   RBC 2.96 (L) 09/18/2022 0609   HGB 9.3 (L) 09/18/2022 0609   HGB 10.4 (L) 06/18/2021 1431   HCT 27.1 (L) 09/18/2022 0609   HCT 31.6 (L) 06/18/2021 1431   PLT 174 09/18/2022 0609   PLT 142 (L) 06/18/2021 1431   MCV 91.6 09/18/2022 0609   MCV 96 06/18/2021 1431   MCH 31.4 09/18/2022 0609   MCHC 34.3 09/18/2022 0609   RDW 18.0 (H) 09/18/2022 0609   RDW 14.3 06/18/2021 1431   LYMPHSABS 1.5 08/28/2022 1810   LYMPHSABS 1.5 06/18/2021 1431   MONOABS 0.7 08/28/2022 1810   EOSABS 0.1 08/28/2022 1810   EOSABS 1.0 (H) 06/18/2021 1431   BASOSABS 0.1 08/28/2022 1810   BASOSABS 0.1 06/18/2021 1431    BMET    Component Value Date/Time   NA 136 09/18/2022 0419   NA 141 06/18/2021 1431   K 3.7 09/18/2022 0419   CL 98 09/18/2022 0419   CO2 23 09/18/2022 0419   GLUCOSE 83 09/18/2022 0419   BUN 27 (H) 09/18/2022 0419   BUN 20 06/18/2021 1431   CREATININE 3.85 (H) 09/18/2022 0419   CREATININE 1.15 02/19/2017 1049   CALCIUM 8.2 (L) 09/18/2022 0419   CALCIUM 8.5 (L) 06/10/2020 0900   GFRNONAA 16 (L) 09/18/2022 0419   GFRAA 6 (L) 03/04/2020 1045    INR    Component Value Date/Time   INR 1.2 08/12/2022 1501     Intake/Output Summary (Last 24 hours) at 09/18/2022 1031 Last data filed at 09/18/2022 0900 Gross per 24 hour  Intake 362 ml  Output 50.3 ml  Net 311.7 ml     Assessment/Plan:  67 y.o. male is s/p  iliofemoral bypass, removal of infected patch with sartorius muscle flap 1 Day Post-Op   Left lower extremity unchanged. Faint Pt/ pero signals. Foot is warm but no motor  Next left groin VAC change will be on Monday Encourage PO intake Continue Therapy PT/OT Remains on Daptomycin for MRSA Appreciate CCM, Nephrology, ID   Graceann Congress, [place] [facility] [phone] 09/18/2022 10:31 AM  VASCULAR STAFF ADDENDUM: I have independently interviewed and examined the patient. I agree with the above.  Pt has been maximally revascularized and will likely require future left leg amputation  Fara Olden, MD [facility] Phone Number: [phone] 09/18/2022 8:04 PM

## 2022-09-18 NOTE — Progress Notes (Addendum)
Physical Therapy Wound Treatment Patient Details  Name: Albert Harris MRN: [id number] Date of Birth: Apr 19, 1956  Today's Date: 09/18/2022 Time: 0900-0937 Time Calculation (min): 37 min  Subjective  Subjective Assessment Subjective: Pt pleasant and agreeable to wound therapy Patient and Family Stated Goals: Heal wound Date of Onset:  (Unknown) Prior Treatments: Dressing changes  Pain Score:    Wound Assessment  Pressure Injury 08/28/22 Sacrum Medial Unstageable - Full thickness tissue loss in which the base of the injury is covered by slough (yellow, tan, gray, green or brown) and/or eschar (tan, brown or black) in the wound bed. Sacral wound with eschar coveri (Active)  Dressing Type Foam - Lift dressing to assess site every shift;Gauze (Comment);Barrier Film (skin prep);Moist to moist;Dakin's-soaked gauze 09/18/22 1557  Dressing Clean, Dry, Intact;Changed 09/18/22 1557  Dressing Change Frequency Twice a day 09/18/22 1557  State of Healing Eschar 09/18/22 1557  Site / Wound Assessment Brown;Yellow;Pink 09/18/22 1557  % Wound base Red or Granulating 10% 09/18/22 1557  % Wound base Yellow/Fibrinous Exudate 70% 09/18/22 1557  % Wound base Black/Eschar 20% 09/18/22 1557  % Wound base Other/Granulation Tissue (Comment) 0% 09/18/22 1557  Peri-wound Assessment Purple 09/17/22 1915  Wound Length (cm) 10.5 cm 09/14/22 1344  Wound Width (cm) 11.3 cm 09/14/22 1344  Wound Depth (cm) 0.1 cm 09/14/22 1344  Wound Surface Area (cm^2) 118.65 cm^2 09/14/22 1344  Wound Volume (cm^3) 11.87 cm^3 09/14/22 1344  Tunneling (cm) 0 09/14/22 1344  Undermining (cm) 0 09/14/22 1344  Margins Unattached edges (unapproximated) 09/18/22 1557  Drainage Amount Moderate 09/18/22 1557  Drainage Description Serosanguineous 09/18/22 1557  Treatment Debridement (Selective);Irrigation 09/18/22 1557      Selective Debridement (non-excisional) Selective Debridement (non-excisional) - Location: Sacrum Selective  Debridement (non-excisional) - Tools Used: Forceps, Scalpel Selective Debridement (non-excisional) - Tissue Removed: Yellow nonviable tissue    Wound Assessment and Plan  Wound Therapy - Assess/Plan/Recommendations Wound Therapy - Clinical Statement: Progressing with debridement.  This patient will benefit from continued wound therapy for selective removal of unviable tissue, to decrease bioburden, and promote wound bed healing. Wound Therapy - Functional Problem List: Decreased tolerance for repositioning and OOB due to wound; global weakness in the setting of extended hospitalization and now ICU stay. Factors Delaying/Impairing Wound Healing: Immobility, Multiple medical problems, Polypharmacy Hydrotherapy Plan: Debridement, Dressing change, Patient/family education Wound Therapy - Frequency: 2X / week Wound Therapy - Follow Up Recommendations: dressing changes by RN  Wound Therapy Goals- Improve the function of patient's integumentary system by progressing the wound(s) through the phases of wound healing (inflammation - proliferation - remodeling) by: Wound Therapy Goals - Improve the function of patient's integumentary system by progressing the wound(s) through the phases of wound healing by: Decrease Necrotic Tissue to: 20% Decrease Necrotic Tissue - Progress: Progressing toward goal Increase Granulation Tissue to: 80% Increase Granulation Tissue - Progress: Progressing toward goal  Goals will be updated until maximal potential achieved or discharge criteria met.  Discharge criteria: when goals achieved, discharge from hospital, MD decision/surgical intervention, no progress towards goals, refusal/missing three consecutive treatments without notification or medical reason.  GP     Charges PT Wound Care Charges $Wound Debridement up to 20 cm: < or equal to 20 cm $ Wound Debridement each add'l 20 sqcm: 5 $PT Hydrotherapy Visit: 1 Visit       [name] [facility] 09/18/2022, 4:03  PM [name] PT Acute [employer] [phone]

## 2022-09-18 NOTE — Progress Notes (Signed)
Patient admitted from the ed to room 34. Patient is pleasantly confused. Patient keeps pulling at iv and line. Patient reoriented without success. Patient impulsive and poor safety awareness. MP shows nsr. Sitter at bedside with patient.

## 2022-09-19 DIAGNOSIS — D123 Benign neoplasm of transverse colon: Secondary | ICD-10-CM

## 2022-09-19 DIAGNOSIS — A4102 Sepsis due to Methicillin resistant Staphylococcus aureus: Secondary | ICD-10-CM | POA: Diagnosis not present

## 2022-09-19 DIAGNOSIS — R6521 Severe sepsis with septic shock: Secondary | ICD-10-CM | POA: Diagnosis not present

## 2022-09-19 DIAGNOSIS — N186 End stage renal disease: Secondary | ICD-10-CM | POA: Diagnosis not present

## 2022-09-19 DIAGNOSIS — Z515 Encounter for palliative care: Secondary | ICD-10-CM

## 2022-09-19 DIAGNOSIS — Z7189 Other specified counseling: Secondary | ICD-10-CM

## 2022-09-19 DIAGNOSIS — A419 Sepsis, unspecified organism: Secondary | ICD-10-CM | POA: Diagnosis not present

## 2022-09-19 DIAGNOSIS — K640 First degree hemorrhoids: Secondary | ICD-10-CM

## 2022-09-19 DIAGNOSIS — S31109A Unspecified open wound of abdominal wall, unspecified quadrant without penetration into peritoneal cavity, initial encounter: Secondary | ICD-10-CM | POA: Diagnosis not present

## 2022-09-19 DIAGNOSIS — I739 Peripheral vascular disease, unspecified: Secondary | ICD-10-CM | POA: Diagnosis not present

## 2022-09-19 DIAGNOSIS — I724 Aneurysm of artery of lower extremity: Secondary | ICD-10-CM | POA: Diagnosis not present

## 2022-09-19 LAB — CBC
HCT: 23.8 % — ABNORMAL LOW (ref 39.0–52.0)
HCT: 30.8 % — ABNORMAL LOW (ref 39.0–52.0)
Hemoglobin: 7.8 g/dL — ABNORMAL LOW (ref 13.0–17.0)
Hemoglobin: 10.2 g/dL — ABNORMAL LOW (ref 13.0–17.0)
MCH: 31 pg (ref 26.0–34.0)
MCH: 30.6 pg (ref 26.0–34.0)
MCHC: 32.8 g/dL (ref 30.0–36.0)
MCHC: 33.1 g/dL (ref 30.0–36.0)
MCV: 94.4 fL (ref 80.0–100.0)
MCV: 92.5 fL (ref 80.0–100.0)
Platelets: 194 10*3/uL (ref 150–400)
Platelets: 196 10*3/uL (ref 150–400)
RBC: 2.52 MIL/uL — ABNORMAL LOW (ref 4.22–5.81)
RBC: 3.33 MIL/uL — ABNORMAL LOW (ref 4.22–5.81)
RDW: 18.4 % — ABNORMAL HIGH (ref 11.5–15.5)
RDW: 18.3 % — ABNORMAL HIGH (ref 11.5–15.5)
WBC: 12.4 10*3/uL — ABNORMAL HIGH (ref 4.0–10.5)
WBC: 14.3 10*3/uL — ABNORMAL HIGH (ref 4.0–10.5)
nRBC: 0.2 % (ref 0.0–0.2)
nRBC: 0.1 % (ref 0.0–0.2)

## 2022-09-19 LAB — RENAL FUNCTION PANEL
Albumin: 1.6 g/dL — ABNORMAL LOW (ref 3.5–5.0)
Anion gap: 10 (ref 5–15)
BUN: 38 mg/dL — ABNORMAL HIGH (ref 8–23)
CO2: 27 mmol/L (ref 22–32)
Calcium: 8.2 mg/dL — ABNORMAL LOW (ref 8.9–10.3)
Chloride: 99 mmol/L (ref 98–111)
Creatinine, Ser: 5.68 mg/dL — ABNORMAL HIGH (ref 0.61–1.24)
GFR, Estimated: 10 mL/min — ABNORMAL LOW (ref >60)
Glucose, Bld: 97 mg/dL (ref 70–99)
Phosphorus: 3.8 mg/dL (ref 2.5–4.6)
Potassium: 3.4 mmol/L — ABNORMAL LOW (ref 3.5–5.1)
Sodium: 136 mmol/L (ref 135–145)

## 2022-09-19 LAB — TYPE AND SCREEN

## 2022-09-19 LAB — VITAMIN C: Vitamin C: 0.2 mg/dL — ABNORMAL LOW (ref 0.4–2.0)

## 2022-09-19 LAB — BPAM RBC

## 2022-09-19 LAB — PREPARE RBC (CROSSMATCH)

## 2022-09-19 MED ORDER — SODIUM CHLORIDE 0.9% IV SOLUTION: Freq: Once | INTRAVENOUS | Status: DC

## 2022-09-19 NOTE — Progress Notes (Signed)
Patient remains in hemo and off the floor.

## 2022-09-19 NOTE — Progress Notes (Signed)
Patient going to [facility] this am.

## 2022-09-19 NOTE — Progress Notes (Signed)
[facility] ROUNDING NOTE   Subjective: No acute complaints.  Reportedly had a bowel movement that was nonbloody. No significant abdominal pain, good appetite.  Hgb drifted slightly this morning, transfused 1 unit.   Objective: Vital signs in last 24 hours: Temp:  [97.5 F (36.4 C)-98 F (36.7 C)] 97.9 F (36.6 C) (04/13 1314) Pulse Rate:  [59-70] 69 (04/13 1317) Resp:  [9-25] 18 (04/13 1317) BP: (131-181)/(53-88) 153/74 (04/13 1314) SpO2:  [96 %-100 %] 98 % (04/13 1317) Weight:  [66.3 kg-73.3 kg] 67.3 kg (04/13 1214) Last BM Date : 09/19/22 General: NAD, pleasant Caucasian male, NAD   Intake/Output from previous day: 04/12 0701 - 04/13 0700 In: 520 [P.O.:520] Out: -  Intake/Output this shift: Total I/O In: 76 [Blood:76] Out: 2000 [Other:2000]   Lab Results: Recent Labs    09/18/22 2201 09/19/22 0745 09/19/22 1501  WBC 14.3* 12.4* 14.3*  HGB 8.2* 7.8* 10.2*  PLT 183 194 196  MCV 93.7 94.4 92.5   BMET Recent Labs    09/17/22 0139 09/18/22 0419 09/19/22 0745  NA 136 136 136  K 3.7 3.7 3.4*  CL 101 98 99  CO2 23 23 27   GLUCOSE 95 83 97  BUN 44* 27* 38*  CREATININE 5.60* 3.85* 5.68*  CALCIUM 8.4* 8.2* 8.2*   LFT Recent Labs    09/17/22 0139 09/18/22 0419 09/19/22 0745  ALBUMIN 1.6* 1.7* 1.6*   PT/INR No results for input(s): "INR" in the last 72 hours.    Imaging/Other results: No results found.  Flex sig 4/7 with multiple rectal ulcers without stigmata. CT-A 4/7 negative for active bleeding.   Colonoscopy 09/15/2022: 2 large ulcers in distal rectum, clip placed. Ulcers and ascending colon, cecum, ileocecal valve, biopsied. Diverticulosis in descending and sigmoid colon. Inflamed mucosa in sigmoid colon, biopsied. 2 mm polyp in distal transverse colon, resected. Nonbleeding internal hemorrhoids.   Flex sigmoidoscopy 4/11: 2 large distal ulcers in distal rectum, injected. treated with bipolar cautery.  Hemostatic gel applied.   Diverticulosis in sigmoid.  No specimens collected  Assessment and Plan:  67 year old male with ESRD on HD, admitted with septic shock from MRSA bacteria from a left groin wound with bleeding/retroperitoneal hematoma s/p L common iliac to profunda femoris bypass and L common iliac artery angioplasty (09/09/2022) with recurrent lower GI bleed with rectal ulcers as most likely etiology.  Patient also with left sided diverticulosis, but rectal ulcers have had bleeding stigmata on 2 occasions, so are the more likely source.   Etiology for rectal ulcers not clear, but given his severe vascular disease, ischemic etiology possible.  New onset Crohn's also possible  Hematochezia secondary rectal ulcers - No further bleeding since early 4/11.  Hopefully bleeding has resolved. - Continue carafate while inpatient.  Ideally continue for 4 weeks total. - Consider outpatient endoscopy to assess healing of ulcers, depending on clinical trajectory. - Continue colace to avoid hard stools/irritation of rectal ulcers - Would not recommend empiric treatment for Crohn's at this point.  GI will sign off.  Please reconsult with any additional questions/concerns   Jenel Lucks, MD  09/19/2022, 4:22 PM [facility]

## 2022-09-19 NOTE — Progress Notes (Addendum)
PROGRESS NOTE                                                                                                                                                                                                             Patient Demographics:    [facility], is a 67 y.o. male, DOB - Jun 04, 1956, ONG:[id number]  Outpatient Primary MD for the patient is [name], NP    LOS - 22  Admit date - 08/28/2022    Chief Complaint  Patient presents with   Abdominal Pain       Brief Narrative (HPI from H&P)   67 yo male brought to ER with abdominal pain, nausea, diaphoresis and unable to feel his Lt foot. Hypotensive with lactic acidosis in ER. 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 Hx of ESRD and had hyperkalemia. Had emergent CRRT. Vascular consulted and taken to OR underwent iliofemoral bypass on the left leg 08/28/22, some bleeding from the left groin and infection this was debrided and a wound VAC was placed by vascular surgery on 09/09/2022.    3/22 Admit, emergent CRRT, to OR for repair of distal Lt external iliac artery 3/24 Extubated. CT head obtained for altered mental status did not show any acute abnormalities 3/26 Off CRRT and pressors, Vascular surgery consulted for clotted AVG 3/27 AV graft thrombectomy attempted in OR but patient became hemodynamically unstable and procedure was aborted.  Was on pressors briefly 3/28 dialysis followed by CVC and aline removal for line holiday 3/29 MRI brain ordered for LLE weakness and found with right subinsular 4mm infarct. Neuro consulted 3/30 CT A/P increased hematoma in left pelvic region associated with Hg drop 9>7 4/1 hgb stable, TEE, new tunneled line planned 4/3 L groin bleeding. Taken to OR urgently by VVS for iliofemoral bypass, removal of infected patch, sartorius flap.  4/6, 4/7 ongoing rectal bleeding. Transfused both days.  4/6 flex sig  with multiple non-bleeding ulcers.  4/9 colonoscopy - 2 large ulcers in the distal rectum with a visible vessel and Hemoclip placed - few ulcers in the ascending colon, cecum, and at the ileocecal valve - hemorrhoids -biopsies sent  4/8 ongoing rectal bleeding. Transfusing again. Did not drink bowel prep for colonoscopy. Aborted.  PCCM called back.  4/11 two large bloody BM's overnight with Hgb drop from  7.8 to 6.5 with hypotension, moved back to ICU and transferred  4/11 seen by GI rectal bleeding site was cauterized bleeding was stabilized 09/18/2022 transferred to my care under TRH on day 22 of his hospital stay    Subjective:   Patient in bed, appears comfortable, denies any headache, no fever, no chest pain or pressure, no shortness of breath , no abdominal pain. No new focal weakness,    Assessment  & Plan :    Peripheral vascular disease, ischemia of the left leg, Lt common femoral artery pseudoaneurysm seen by vascular surgery Dr. Roxan Hockey s/p iliofemoral bypass this admission, complicated by left groin bleeding and left groin infection.  Left groin infected patch removed has left groin wound VAC.  Received antibiotics.  Infection stable.  Is discussed with vascular surgeon Dr. Roxan Hockey on 09/18/2022 he is not a candidate for any further vascular intervention at all.  Unfortunately has lost all use of left lower extremity at some point it will be amputated.  Currently pain control and medical management only.  Retroperitoneal hemorrhage, stable - Initially had increased L flank pain, however clinically resolved no abdominal pain now.   Acute blood loss anemia secondary to hematochezia, Acute lower GI bleeding with multiple rectal bleeding ulcers, also had some retroperitoneal bleed. Flex sig 4/6 with 5 non-bleeding ulcers identified, He underwent colonoscopy which showed multiple rectal ulcers, one of the ulcers showed visible bleeding vessel, clips applied and hemorrhoids, repeated  episodes of hematochezia with hypotension overnight prompting return to ICU, Hgb 6.5, received 2 units of packed RBC on 09/16/2022, seen by GI was taken to Endo lab and bleeding sites were cauterized on 09/17/2022 with stabilization of bleeding, some fall likely from old blood loss on 09/19/2022 getting another unit of blood on 09/19/2022.  Case discussed with GI Dr. Tomasa Rand on 09/18/2018 for his risk of rebleeding from the rectal ulcers is very high, etiology of ulcers appears to be consistent with ischemia most likely considering his history of poor vascular health, continue to monitor H&H.  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 Faecium/VRE. [facility] 3/27 negative. Continue daptomycin on dialysis days, Infectious disease following.  Case discussed with Dr. Elinor Parkinson ID 09/18/22 - EOT is 10/23/22 - Dapto with HD. He has a fu with Dr Thedore Mins on 09/28/22.   ESRD, Hyponatremia, improved  -Nephrology is following, HD per nephrology via RIJ tunneled catheter. TTS    Chronic systolic CHF with EF 35 to 40% on Echo from 06/29/22. Hx of pulmonary hypertension.  Pressure is improved with placed on Coreg, Plavix held due to ongoing GI bleed.    Acute metabolic encephalopathy from uremia, acidosis, Acute 78mm right subinsular stroke -CT head on 3/24 was normal, Mental status has improved, left leg ischemia and loss of function.   Moderate malnutrition - Continue dietary supplements   COPD.  Stable.  Dyslipidemia.  Resumed statin.  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  Outpatient follow-up with pulmonary and GI to be arranged by PCP postdischarge.       Condition - Extremely Guarded  Family Communication  :  None present  Code Status :  Full  Consults  :  GI, VVS, ID, PCCM, palliative care  PUD Prophylaxis : PPI   Procedures  :            Disposition Plan  :    Status is: Inpatient  DVT Prophylaxis  :  SCD's  Start: 09/09/22 1841   Lab Results  Component Value Date   PLT 183 09/18/2022    Diet :  Diet Order             Diet renal/carb modified with fluid restriction Fluid restriction: 1200 mL Fluid; Room service appropriate? Yes; Fluid consistency: Thin  Diet effective now                    Inpatient Medications  Scheduled Meds:  (feeding supplement) PROSource Plus  30 mL Oral TID BM   sodium chloride   Intravenous Once   acetaminophen  650 mg Oral Q6H   calcitRIOL  0.25 mcg Oral Q T,Th,Sa-HD   calcium acetate  1,334 mg Oral TID WC   carvedilol  3.125 mg Oral BID WC   Chlorhexidine Gluconate Cloth  6 each Topical Q0600   Chlorhexidine Gluconate Cloth  6 each Topical Q0600   darbepoetin (ARANESP) injection - DIALYSIS  200 mcg Subcutaneous Q Wed-1800   feeding supplement (NEPRO CARB STEADY)  237 mL Oral TID BM   leptospermum manuka honey  1 Application Topical Daily   liver oil-zinc oxide   Topical TID   methocarbamol  500 mg Oral TID   multivitamin  1 tablet Oral QHS   pantoprazole  40 mg Oral Daily   polyethylene glycol  17 g Oral BID   rosuvastatin  10 mg Oral Daily   sodium chloride flush  10-40 mL Intracatheter Q12H   sucralfate  2 g Rectal BID   Continuous Infusions:  sodium chloride Stopped (09/17/22 0805)   DAPTOmycin (CUBICIN) 600 mg in sodium chloride 0.9 % IVPB 124 mL/hr at 09/17/22 2200   DAPTOmycin (CUBICIN) 750 mg in sodium chloride 0.9 % IVPB     sodium chloride     PRN Meds:.alteplase, dicyclomine, docusate sodium, heparin, hydrALAZINE, HYDROmorphone (DILAUDID) injection, mouth rinse, oxyCODONE, pentafluoroprop-tetrafluoroeth, sodium chloride, traMADol     Objective:   Vitals:   09/19/22 0400 09/19/22 0615 09/19/22 0740 09/19/22 0741  BP: (!) 160/88  (!) 166/72   Pulse: 65 62 63   Resp: 13 (!) 22 13   Temp: 97.8 F (36.6 C)  97.7 F (36.5 C)   TempSrc: Oral  Oral   SpO2: 98% 99% 100%   Weight:  73.3 kg  66.3 kg  Height:        Wt  Readings from Last 3 Encounters:  09/19/22 66.3 kg  08/23/22 64.1 kg  08/15/22 64.9 kg     Intake/Output Summary (Last 24 hours) at 09/19/2022 0746 Last data filed at 09/18/2022 1400 Gross per 24 hour  Intake 520 ml  Output --  Net 520 ml     Physical Exam  Awake Alert, No new F.N deficits, Normal affect .AT,PERRAL Supple Neck, No JVD,   Symmetrical Chest wall movement, Good air movement bilaterally, CTAB RRR,No Gallops,Rubs or new Murmurs,  +ve B.Sounds, Abd Soft, No tenderness,   Left groin wound VAC in place, left lower extremity extremely weak,    RN pressure injury documentation: Pressure Injury 08/28/22 Sacrum Medial Unstageable - Full thickness tissue loss in which the base of the injury is covered by slough (yellow, tan, gray, green or brown) and/or eschar (tan, brown or black) in the wound bed. Sacral wound with eschar coveri (Active)  08/28/22 2228  Location: Sacrum  Location Orientation: Medial  Staging: Unstageable - Full thickness tissue loss in which the base of the injury is covered by slough (yellow, tan, gray,  green or brown) and/or eschar (tan, brown or black) in the wound bed.  Wound Description (Comments): Sacral wound with eschar covering wound bed  Present on Admission: Yes  Dressing Type Foam - Lift dressing to assess site every shift;Gauze (Comment) 09/18/22 1935     Pressure Injury 09/04/22 Heel Left Deep Tissue Pressure Injury - Purple or maroon localized area of discolored intact skin or blood-filled blister due to damage of underlying soft tissue from pressure and/or shear. (Active)  09/04/22 1200  Location: Heel  Location Orientation: Left  Staging: Deep Tissue Pressure Injury - Purple or maroon localized area of discolored intact skin or blood-filled blister due to damage of underlying soft tissue from pressure and/or shear.  Wound Description (Comments):   Present on Admission:   Dressing Type Foam - Lift dressing to assess site every shift  09/18/22 1935      Data Review:    Recent Labs  Lab 09/17/22 1133 09/17/22 1933 09/18/22 0609 09/18/22 1342 09/18/22 2201  WBC 13.9* 14.1* 13.0* 13.5* 14.3*  HGB 9.1* 9.0* 9.3* 8.5* 8.2*  HCT 26.8* 26.2* 27.1* 25.7* 25.1*  PLT 158 158 174 183 183  MCV 89.9 90.7 91.6 91.8 93.7  MCH 30.5 31.1 31.4 30.4 30.6  MCHC 34.0 34.4 34.3 33.1 32.7  RDW 17.2* 18.0* 18.0* 18.1* 18.5*    Recent Labs  Lab 09/13/22 0304 09/13/22 1606 09/14/22 0720 09/15/22 0231 09/15/22 1501 09/16/22 0701 09/17/22 0139 09/18/22 0419  NA 133*   < > 134* 136  --  135 136 136  K 3.9   < > 4.4 4.9  --  3.6 3.7 3.7  CL 100   < > 96* 102  --  100 101 98  CO2 24   < > 21* 19*  --  26 23 23   ANIONGAP 9   < > 17* 15  --  9 12 15   GLUCOSE 86   < > 93 92  --  92 95 83  BUN 34*   < > 47* 59*  --  33* 44* 27*  CREATININE 3.68*   < > 5.23* 6.18*  --  4.46* 5.60* 3.85*  ALBUMIN 1.8*  --   --  1.7*  --  1.8* 1.6* 1.7*  CRP  --   --   --   --  10.0*  --   --   --   MG  --   --  2.1  --   --   --   --   --   CALCIUM 7.8*   < > 8.3* 7.7*  --  7.8* 8.4* 8.2*   < > = values in this interval not displayed.      Recent Labs  Lab 09/14/22 0720 09/15/22 0231 09/15/22 1501 09/16/22 0701 09/17/22 0139 09/18/22 0419  CRP  --   --  10.0*  --   --   --   MG 2.1  --   --   --   --   --   CALCIUM 8.3* 7.7*  --  7.8* 8.4* 8.2*   Lab Results  Component Value Date   CHOL 94 09/05/2022   HDL 29 (L) 09/05/2022   LDLCALC 45 09/05/2022   TRIG 102 09/05/2022   CHOLHDL 3.2 09/05/2022    Lab Results  Component Value Date   HGBA1C 5.1 09/05/2022    Radiology Reports No results found.    Signature  -   Susa Raring M.D on 09/19/2022 at 7:46 AM   -  To page go to [email or web address]

## 2022-09-19 NOTE — Procedures (Signed)
I was present at this dialysis session. I have reviewed the session itself and made appropriate changes.   On 3K bath with UF goal of 2L.  Stable overnight.  No c/o.  Tolerating HD well via TDC.     Filed Weights   09/18/22 0500 09/19/22 0615 09/19/22 0741  Weight: 73.5 kg 73.3 kg 66.3 kg    Recent Labs  Lab 09/18/22 0419  NA 136  K 3.7  CL 98  CO2 23  GLUCOSE 83  BUN 27*  CREATININE 3.85*  CALCIUM 8.2*  PHOS 3.8    Recent Labs  Lab 09/18/22 0609 09/18/22 1342 09/18/22 2201  WBC 13.0* 13.5* 14.3*  HGB 9.3* 8.5* 8.2*  HCT 27.1* 25.7* 25.1*  MCV 91.6 91.8 93.7  PLT 174 183 183    Scheduled Meds:  (feeding supplement) PROSource Plus  30 mL Oral TID BM   sodium chloride   Intravenous Once   acetaminophen  650 mg Oral Q6H   calcitRIOL  0.25 mcg Oral Q T,Th,Sa-HD   calcium acetate  1,334 mg Oral TID WC   carvedilol  3.125 mg Oral BID WC   Chlorhexidine Gluconate Cloth  6 each Topical Q0600   Chlorhexidine Gluconate Cloth  6 each Topical Q0600   darbepoetin (ARANESP) injection - DIALYSIS  200 mcg Subcutaneous Q Wed-1800   feeding supplement (NEPRO CARB STEADY)  237 mL Oral TID BM   leptospermum manuka honey  1 Application Topical Daily   liver oil-zinc oxide   Topical TID   methocarbamol  500 mg Oral TID   multivitamin  1 tablet Oral QHS   pantoprazole  40 mg Oral Daily   polyethylene glycol  17 g Oral BID   rosuvastatin  10 mg Oral Daily   sodium chloride flush  10-40 mL Intracatheter Q12H   sucralfate  2 g Rectal BID   Continuous Infusions:  sodium chloride Stopped (09/17/22 0805)   DAPTOmycin (CUBICIN) 600 mg in sodium chloride 0.9 % IVPB 124 mL/hr at 09/17/22 2200   DAPTOmycin (CUBICIN) 750 mg in sodium chloride 0.9 % IVPB     sodium chloride     PRN Meds:.alteplase, dicyclomine, docusate sodium, heparin, hydrALAZINE, HYDROmorphone (DILAUDID) injection, mouth rinse, oxyCODONE, pentafluoroprop-tetrafluoroeth, sodium chloride, traMADol   [facility] Heck   MD 09/19/2022, 7:51 AM

## 2022-09-19 NOTE — Progress Notes (Signed)
Pharmacy Antibiotic Note  [facility] Harris is a 67 y.o. 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 Now with MRSA bacteremia. Noted patient is ESRD HD TTS PTA, with a right arm AV graft and a catheter in his right IJ. Pharmacy consulted to dose daptomycin due to enterococcus faecium and staph aureus growing from tissue cultures from 4/3.  Planning to keep on TTS schedule for HD- will schedule Daptomycin with HD. 4/8 CK 57  Plan: - Daptomycin 600mg  (8 mg/kg) IV post HD on Tues/Thurs and 750 mg (10 m/gk) IV post HD on Saturdays - Will continue to monitor CK, follow HD schedule/duration, culture results, LOT, and antibiotic de-escalation plans   Height: 5\' 8"  (172.7 cm) Weight: 66.3 kg (146 lb 2.6 oz) (BED) IBW/kg (Calculated) : 68.4  Temp (24hrs), Avg:97.8 F (36.6 C), Min:97.5 F (36.4 C), Max:98.3 F (36.8 C)  Recent Labs  Lab 09/15/22 0231 09/16/22 0701 09/17/22 0139 09/17/22 0628 09/17/22 1933 09/18/22 0419 09/18/22 0609 09/18/22 1342 09/18/22 2201 09/19/22 0745  WBC  --  11.7* 12.0*   < > 14.1*  --  13.0* 13.5* 14.3* 12.4*  CREATININE 6.18* 4.46* 5.60*  --   --  3.85*  --   --   --  5.68*   < > = values in this interval not displayed.     Estimated Creatinine Clearance: 11.7 mL/min (A) (by C-G formula based on SCr of 5.68 mg/dL (H)).    Allergies  Allergen Reactions   Benadryl [Diphenhydramine] Rash   Lidocaine-Prilocaine Rash    Caused red marks up and down arm   Antimicrobials this admission:  Cefazolin 3/23 x 1 Vancomycin 3/24 >> 4/5 Zosyn 4/3 >>4/3 Daptomycin 4/5>>  Microbiology results:  4/3 L groin wound: MSRA + VRE 4/3 MRSA PCR: neg 3/27 Bcx: neg 3/25 Bcx: MRSA 3/23 Bcx: MRSA   Thank you for allowing pharmacy to be a part of this patient's care.  Rennis Petty, PharmD PGY1 Pharmacy Resident 09/19/2022 8:48 AM

## 2022-09-19 NOTE — Progress Notes (Signed)
Patient back from [facility].  

## 2022-09-19 NOTE — Progress Notes (Signed)
Received patient in bed to unit.  Alert and oriented.  Informed consent signed and in chart.   TX duration:4  Patient tolerated well.  Transported back to the room  Alert, without acute distress.  Hand-off given to patient's nurse.   Access used: RIGHT TDC Access issues: NONE  Total UF removed: 2L Medication(s) given: 1 UNIT PRBC, CALCITRIOL    09/19/22 1158  Vitals  Temp 98 F (36.7 C)  Temp Source Oral  BP 131/88  MAP (mmHg) 98  BP Location Right Arm  BP Method Automatic  Patient Position (if appropriate) Lying  Pulse Rate 62  Pulse Rate Source Monitor  ECG Heart Rate 62  Resp (!) 22  Oxygen Therapy  SpO2 100 %  O2 Device Room Air  During Treatment Monitoring  HD Safety Checks Performed Yes  Intra-Hemodialysis Comments Tx completed  Dialysis Fluid Bolus Normal Saline  Bolus Amount (mL) 300 mL  Post Treatment  Dialyzer Clearance Lightly streaked  Duration of HD Treatment -hour(s) 4 hour(s)  Liters Processed 84  Fluid Removed (mL) 2000 mL  Tolerated HD Treatment Yes      [facility]

## 2022-09-20 ENCOUNTER — Encounter (HOSPITAL_COMMUNITY): Payer: Self-pay | Admitting: Gastroenterology

## 2022-09-20 DIAGNOSIS — R6521 Severe sepsis with septic shock: Secondary | ICD-10-CM | POA: Diagnosis not present

## 2022-09-20 DIAGNOSIS — A419 Sepsis, unspecified organism: Secondary | ICD-10-CM | POA: Diagnosis not present

## 2022-09-20 LAB — CBC
HCT: 29.8 % — ABNORMAL LOW (ref 39.0–52.0)
Hemoglobin: 9.6 g/dL — ABNORMAL LOW (ref 13.0–17.0)
MCH: 30.4 pg (ref 26.0–34.0)
MCHC: 32.2 g/dL (ref 30.0–36.0)
MCV: 94.3 fL (ref 80.0–100.0)
Platelets: 218 10*3/uL (ref 150–400)
RBC: 3.16 MIL/uL — ABNORMAL LOW (ref 4.22–5.81)
RDW: 18.7 % — ABNORMAL HIGH (ref 11.5–15.5)
WBC: 14.9 10*3/uL — ABNORMAL HIGH (ref 4.0–10.5)
nRBC: 0.2 % (ref 0.0–0.2)

## 2022-09-20 LAB — TYPE AND SCREEN
ABO/RH(D): O POS
Antibody Screen: NEGATIVE
Unit division: 0

## 2022-09-20 LAB — RENAL FUNCTION PANEL
Albumin: 1.7 g/dL — ABNORMAL LOW (ref 3.5–5.0)
Anion gap: 8 (ref 5–15)
BUN: 23 mg/dL (ref 8–23)
CO2: 29 mmol/L (ref 22–32)
Calcium: 7.9 mg/dL — ABNORMAL LOW (ref 8.9–10.3)
Chloride: 98 mmol/L (ref 98–111)
Creatinine, Ser: 3.63 mg/dL — ABNORMAL HIGH (ref 0.61–1.24)
GFR, Estimated: 17 mL/min — ABNORMAL LOW (ref >60)
Glucose, Bld: 93 mg/dL (ref 70–99)
Phosphorus: 2 mg/dL — ABNORMAL LOW (ref 2.5–4.6)
Potassium: 3.8 mmol/L (ref 3.5–5.1)
Sodium: 135 mmol/L (ref 135–145)

## 2022-09-20 LAB — BPAM RBC
Blood Product Expiration Date: 202405072359
ISSUE DATE / TIME: 202404130928
Unit Type and Rh: 5100

## 2022-09-20 MED ORDER — OXYCODONE HCL 5 MG PO TABS
10.0000 mg | ORAL_TABLET | ORAL | Status: DC | PRN
  Administered 2022-09-20: 10 mg via ORAL
  Filled 2022-09-20: qty 2

## 2022-09-20 NOTE — Progress Notes (Signed)
PROGRESS NOTE                                                                                                                                                                                                             Patient Demographics:    Albert Harris, is a 67 y.o. male, DOB - 1955/11/24, WUJ:[id number]  Outpatient Primary MD for the patient is [name], NP    LOS - 23  Admit date - 08/28/2022    Chief Complaint  Patient presents with   Abdominal Pain       Brief Narrative (HPI from H&P)   67 yo male brought to ER with abdominal pain, nausea, diaphoresis and unable to feel his Lt foot. Hypotensive with lactic acidosis in ER. 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 Hx of ESRD and had hyperkalemia. Had emergent CRRT. Vascular consulted and taken to OR underwent iliofemoral bypass on the left leg 08/28/22, some bleeding from the left groin and infection this was debrided and a wound VAC was placed by vascular surgery on 09/09/2022.    3/22 Admit, emergent CRRT, to OR for repair of distal Lt external iliac artery 3/24 Extubated. CT head obtained for altered mental status did not show any acute abnormalities 3/26 Off CRRT and pressors, Vascular surgery consulted for clotted AVG 3/27 AV graft thrombectomy attempted in OR but patient became hemodynamically unstable and procedure was aborted.  Was on pressors briefly 3/28 dialysis followed by CVC and aline removal for line holiday 3/29 MRI brain ordered for LLE weakness and found with right subinsular 4mm infarct. Neuro consulted 3/30 CT A/P increased hematoma in left pelvic region associated with Hg drop 9>7 4/1 hgb stable, TEE, new tunneled line planned 4/3 L groin bleeding. Taken to OR urgently by VVS for iliofemoral bypass, removal of infected patch, sartorius flap.  4/6, 4/7 ongoing rectal bleeding. Transfused both days.  4/6 flex sig  with multiple non-bleeding ulcers.  4/9 colonoscopy - 2 large ulcers in the distal rectum with a visible vessel and Hemoclip placed - few ulcers in the ascending colon, cecum, and at the ileocecal valve - hemorrhoids -biopsies sent  4/8 ongoing rectal bleeding. Transfusing again. Did not drink bowel prep for colonoscopy. Aborted.  PCCM called back.  4/11 two large bloody BM's overnight with Hgb drop from  7.8 to 6.5 with hypotension, moved back to ICU and transferred  4/11 seen by GI rectal bleeding site was cauterized bleeding was stabilized 09/18/2022 transferred to my care under TRH on day 22 of his hospital stay    Subjective:   Patient in bed, appears comfortable, denies any headache, no fever, no chest pain or pressure, no shortness of breath , no abdominal pain. No new focal weakness. L leg pain and low back pain.   Assessment  & Plan :    Peripheral vascular disease, ischemia of the left leg, Lt common femoral artery pseudoaneurysm seen by vascular surgery Dr. Roxan Hockey s/p iliofemoral bypass this admission, complicated by left groin bleeding and left groin infection.  Left groin infected patch removed has left groin wound VAC.  Received antibiotics.  Infection stable.  Is discussed with vascular surgeon Dr. Roxan Hockey on 09/18/2022 he is not a candidate for any further vascular intervention at all.  Unfortunately has lost all use of left lower extremity at some point it will be amputated.  Currently pain control and medical management only.  Retroperitoneal hemorrhage, stable - Initially had increased L flank pain, however clinically resolved no abdominal pain now.   Acute blood loss anemia secondary to hematochezia, Acute lower GI bleeding with multiple rectal bleeding ulcers, also had some retroperitoneal bleed. Flex sig 4/6 with 5 non-bleeding ulcers identified, He underwent colonoscopy which showed multiple rectal ulcers, one of the ulcers showed visible bleeding vessel, clips applied and  hemorrhoids, repeated episodes of hematochezia with hypotension overnight prompting return to ICU, Hgb 6.5, received 2 units of packed RBC on 09/16/2022, seen by GI was taken to Endo lab and bleeding sites were cauterized on 09/17/2022 with stabilization of bleeding, some fall likely from old blood loss on 09/19/2022 getting another unit of blood on 09/19/2022.  Case discussed with GI Dr. Tomasa Rand on 09/18/2018 for his risk of rebleeding from the rectal ulcers is very high, etiology of ulcers appears to be consistent with ischemia most likely considering his history of poor vascular health, continue to monitor H&H.  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 Faecium/VRE. [employer] 3/27 negative. Continue daptomycin on dialysis days, Infectious disease following.  Case discussed with Dr. Elinor Parkinson ID 09/18/22 - EOT is 10/23/22 - Dapto with HD. He has a fu with Dr Thedore Mins on 09/28/22.   ESRD, Hyponatremia, improved  -Nephrology is following, HD per nephrology via RIJ tunneled catheter. TTS    Chronic systolic CHF with EF 35 to 40% on Echo from 06/29/22. Hx of pulmonary hypertension.  Pressure is improved with placed on Coreg, Plavix held due to ongoing GI bleed.    Acute metabolic encephalopathy from uremia, acidosis, Acute 4mm right subinsular stroke -CT head on 3/24 was normal, Mental status has improved, left leg ischemia and loss of function.   Moderate malnutrition - Continue dietary supplements   COPD.  Stable.  Dyslipidemia.  Resumed statin.  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  Outpatient follow-up with pulmonary and GI to be arranged by PCP postdischarge.       Condition - Extremely Guarded  Family Communication  :  None present  Code Status :  Full  Consults  :  GI, VVS, ID, PCCM, palliative care  PUD Prophylaxis : PPI   Procedures  :            Disposition Plan  :  Status is: Inpatient  DVT  Prophylaxis  :    SCD's Start: 09/09/22 1841   Lab Results  Component Value Date   PLT 218 09/20/2022    Diet :  Diet Order             Diet renal/carb modified with fluid restriction Fluid restriction: 1200 mL Fluid; Room service appropriate? Yes; Fluid consistency: Thin  Diet effective now                    Inpatient Medications  Scheduled Meds:  (feeding supplement) PROSource Plus  30 mL Oral TID BM   sodium chloride   Intravenous Once   acetaminophen  650 mg Oral Q6H   calcitRIOL  0.25 mcg Oral Q T,Th,Sa-HD   calcium acetate  1,334 mg Oral TID WC   carvedilol  3.125 mg Oral BID WC   Chlorhexidine Gluconate Cloth  6 each Topical Q0600   Chlorhexidine Gluconate Cloth  6 each Topical Q0600   darbepoetin (ARANESP) injection - DIALYSIS  200 mcg Subcutaneous Q Wed-1800   feeding supplement (NEPRO CARB STEADY)  237 mL Oral TID BM   leptospermum manuka honey  1 Application Topical Daily   liver oil-zinc oxide   Topical TID   methocarbamol  500 mg Oral TID   multivitamin  1 tablet Oral QHS   pantoprazole  40 mg Oral Daily   polyethylene glycol  17 g Oral BID   rosuvastatin  10 mg Oral Daily   sodium chloride flush  10-40 mL Intracatheter Q12H   sucralfate  2 g Rectal BID   Continuous Infusions:  sodium chloride Stopped (09/17/22 0805)   DAPTOmycin (CUBICIN) 600 mg in sodium chloride 0.9 % IVPB 124 mL/hr at 09/20/22 0800   DAPTOmycin (CUBICIN) 750 mg in sodium chloride 0.9 % IVPB 130 mL/hr at 09/20/22 0800   sodium chloride     PRN Meds:.alteplase, dicyclomine, docusate sodium, heparin, hydrALAZINE, HYDROmorphone (DILAUDID) injection, mouth rinse, oxyCODONE, pentafluoroprop-tetrafluoroeth, sodium chloride, traMADol     Objective:   Vitals:   09/20/22 0000 09/20/22 0400 09/20/22 0800 09/20/22 0956  BP: (!) 175/75 (!) 159/80 (!) 181/68 (!) 95/59  Pulse: 65 65 62 65  Resp: 17 17 16 20   Temp: 98.7 F (37.1 C) 98.1 F (36.7 C) (!) 97.4 F (36.3 C)   TempSrc:  Oral Oral Oral   SpO2: 100% 98% 100% 100%  Weight:      Height:        Wt Readings from Last 3 Encounters:  09/19/22 67.3 kg  08/23/22 64.1 kg  08/15/22 64.9 kg     Intake/Output Summary (Last 24 hours) at 09/20/2022 1035 Last data filed at 09/20/2022 0800 Gross per 24 hour  Intake 485 ml  Output 2000 ml  Net -1515 ml     Physical Exam  Awake Alert, No new F.N deficits, Normal affect [place].AT,PERRAL Supple Neck, No JVD,   Symmetrical Chest wall movement, Good air movement bilaterally, CTAB RRR,No Gallops,Rubs or new Murmurs,  +ve B.Sounds, Abd Soft, No tenderness,   Left groin wound VAC in place, left lower extremity extremely weak,    RN pressure injury documentation: Pressure Injury 08/28/22 Sacrum Medial Unstageable - Full thickness tissue loss in which the base of the injury is covered by slough (yellow, tan, gray, green or brown) and/or eschar (tan, brown or black) in the wound bed. Sacral wound with eschar coveri (Active)  08/28/22 2228  Location: Sacrum  Location Orientation: Medial  Staging: Unstageable -  Full thickness tissue loss in which the base of the injury is covered by slough (yellow, tan, gray, green or brown) and/or eschar (tan, brown or black) in the wound bed.  Wound Description (Comments): Sacral wound with eschar covering wound bed  Present on Admission: Yes  Dressing Type Foam - Lift dressing to assess site every shift;Gauze (Comment) 09/19/22 1947     Pressure Injury 09/04/22 Heel Left Deep Tissue Pressure Injury - Purple or maroon localized area of discolored intact skin or blood-filled blister due to damage of underlying soft tissue from pressure and/or shear. (Active)  09/04/22 1200  Location: Heel  Location Orientation: Left  Staging: Deep Tissue Pressure Injury - Purple or maroon localized area of discolored intact skin or blood-filled blister due to damage of underlying soft tissue from pressure and/or shear.  Wound Description (Comments):    Present on Admission:   Dressing Type Foam - Lift dressing to assess site every shift 09/19/22 1947      Data Review:    Recent Labs  Lab 09/18/22 1342 09/18/22 2201 09/19/22 0745 09/19/22 1501 09/20/22 0344  WBC 13.5* 14.3* 12.4* 14.3* 14.9*  HGB 8.5* 8.2* 7.8* 10.2* 9.6*  HCT 25.7* 25.1* 23.8* 30.8* 29.8*  PLT 183 183 194 196 218  MCV 91.8 93.7 94.4 92.5 94.3  MCH 30.4 30.6 31.0 30.6 30.4  MCHC 33.1 32.7 32.8 33.1 32.2  RDW 18.1* 18.5* 18.4* 18.3* 18.7*    Recent Labs  Lab 09/14/22 0720 09/15/22 0231 09/15/22 1501 09/16/22 0701 09/17/22 0139 09/18/22 0419 09/19/22 0745 09/20/22 0344  NA 134*   < >  --  135 136 136 136 135  K 4.4   < >  --  3.6 3.7 3.7 3.4* 3.8  CL 96*   < >  --  100 101 98 99 98  CO2 21*   < >  --  26 23 23 27 29   ANIONGAP 17*   < >  --  9 12 15 10 8   GLUCOSE 93   < >  --  92 95 83 97 93  BUN 47*   < >  --  33* 44* 27* 38* 23  CREATININE 5.23*   < >  --  4.46* 5.60* 3.85* 5.68* 3.63*  ALBUMIN  --    < >  --  1.8* 1.6* 1.7* 1.6* 1.7*  CRP  --   --  10.0*  --   --   --   --   --   MG 2.1  --   --   --   --   --   --   --   CALCIUM 8.3*   < >  --  7.8* 8.4* 8.2* 8.2* 7.9*   < > = values in this interval not displayed.      Recent Labs  Lab 09/14/22 0720 09/15/22 0231 09/15/22 1501 09/16/22 0701 09/17/22 0139 09/18/22 0419 09/19/22 0745 09/20/22 0344  CRP  --   --  10.0*  --   --   --   --   --   MG 2.1  --   --   --   --   --   --   --   CALCIUM 8.3*   < >  --  7.8* 8.4* 8.2* 8.2* 7.9*   < > = values in this interval not displayed.   Lab Results  Component Value Date   CHOL 94 09/05/2022   HDL 29 (L) 09/05/2022   LDLCALC  45 09/05/2022   TRIG 102 09/05/2022   CHOLHDL 3.2 09/05/2022    Lab Results  Component Value Date   HGBA1C 5.1 09/05/2022    Radiology Reports No results found.    Signature  -   Susa Raring M.D on 09/20/2022 at 10:35 AM   -  To page go to [email or web address]

## 2022-09-20 NOTE — Progress Notes (Signed)
[facility] Progress Note   Subjective:   Having some pain in sacral region, improved with repositioning. Denies SOB, CP, palpitations, dizziness. Tolerated HD yesterday with 2L UF.   Objective Vitals:   09/19/22 1948 09/20/22 0000 09/20/22 0400 09/20/22 0800  BP: 114/68 (!) 175/75 (!) 159/80 (!) 181/68  Pulse: 66 65 65 62  Resp: Temp: 97.9 F (36.6 C) 98.7 F (37.1 C) 98.1 F (36.7 C) (!) 97.4 F (36.3 C)  TempSrc: Oral Oral Oral Oral  SpO2: 97% 100% 98% 100%  Weight:      Height:       Physical Exam General: Alert male in NAD Heart: RRR, no murmurs, rubs or gallops Lungs: CTA bilaterally Abdomen: Soft, non-distended, +BS Extremities: No edema b/l lower extremities Dialysis Access: TDC in R chest, RUE AVG+ faint bruit  Additional Objective Labs: Basic Metabolic Panel: Recent Labs  Lab 09/18/22 0419 09/19/22 0745 09/20/22 0344  NA 136 136 135  K 3.7 3.4* 3.8  CL 98 99 98  CO2 GLUCOSE 83 97 93  BUN 27* 38* 23  CREATININE 3.85* 5.68* 3.63*  CALCIUM 8.2* 8.2* 7.9*  PHOS 3.8 3.8 2.0*   Liver Function Tests: Recent Labs  Lab 09/18/22 0419 09/19/22 0745 09/20/22 0344  ALBUMIN 1.7* 1.6* 1.7*   No results for input(s): "LIPASE", "AMYLASE" in the last 168 hours. CBC: Recent Labs  Lab 09/18/22 1342 09/18/22 2201 09/19/22 0745 09/19/22 1501 09/20/22 0344  WBC 13.5* 14.3* 12.4* 14.3* 14.9*  HGB 8.5* 8.2* 7.8* 10.2* 9.6*  HCT 25.7* 25.1* 23.8* 30.8* 29.8*  MCV 91.8 93.7 94.4 92.5 94.3  PLT 183 183 194 196 218   Blood Culture    Component Value Date/Time   SDES TISSUE 09/09/2022 1453   SPECREQUEST VESSEL PT ON VANC ANCEF 09/09/2022 1453   CULT  09/09/2022 1453    RARE STAPHYLOCOCCUS AUREUS SUSCEPTIBILITIES PERFORMED ON PREVIOUS CULTURE WITHIN THE LAST 5 DAYS. NO ANAEROBES ISOLATED Performed at [facility], [address], [place], [place] [address]    REPTSTATUS 09/14/2022 FINAL 09/09/2022 1453     Cardiac Enzymes: Recent Labs  Lab 09/14/22 0720  CKTOTAL 57   CBG: Recent Labs  Lab 09/15/22 0340 09/15/22 0749 09/15/22 1105 09/15/22 1554 09/16/22 0740  GLUCAP 88 88 82 110* 95   Iron Studies: No results for input(s): "IRON", "TIBC", "TRANSFERRIN", "FERRITIN" in the last 72 hours. @ Studies/Results: No results found. Medications:  sodium chloride Stopped (09/17/22 0805)   DAPTOmycin (CUBICIN) 600 mg in sodium chloride 0.9 % IVPB 124 mL/hr at 09/20/22 0800   DAPTOmycin (CUBICIN) 750 mg in sodium chloride 0.9 % IVPB 130 mL/hr at 09/20/22 0800   sodium chloride      (feeding supplement) PROSource Plus  30 mL Oral TID BM   sodium chloride   Intravenous Once   acetaminophen  650 mg Oral Q6H   calcitRIOL  0.25 mcg Oral Q T,Th,Sa-HD   calcium acetate  1,334 mg Oral TID WC   carvedilol  3.125 mg Oral BID WC   Chlorhexidine Gluconate Cloth  6 each Topical Q0600   Chlorhexidine Gluconate Cloth  6 each Topical Q0600   darbepoetin (ARANESP) injection - DIALYSIS  200 mcg Subcutaneous Q Wed-1800   feeding supplement (NEPRO CARB STEADY)  237 mL Oral TID BM   leptospermum manuka honey  1 Application Topical Daily   liver oil-zinc oxide   Topical TID   methocarbamol  500 mg  Oral TID   multivitamin  1 tablet Oral QHS   pantoprazole  40 mg Oral Daily   polyethylene glycol  17 g Oral BID   rosuvastatin  10 mg Oral Daily   sodium chloride flush  10-40 mL Intracatheter Q12H   sucralfate  2 g Rectal BID    Outpatient Dialysis Orders: TTS SW  4h   350/800  64.5kg  RFA AVG= clotted/ new TDC in place   Heparin none  - last OP HD 3/14, post wt 65.9kg - venofer 50mg  IV weekly - rocaltrol 0.25 mcg po tiw - phoslo 2 ac tid - no esa, last Hb 11.0  Assessment/Plan: Left common femoral artery pseudoaneurysm - sp repair of L EIA on 3/23. Went back to OR 4/3 for L CIA to profunda bypass, L CIA pta and stenting, and muscle flap (sartorious to groin). Still high risk of limb  loss.   MRSA Bacteremia, VRE tissue cx - sp line holiday and getting IV daptomycin per ID/Pharmacy x 6wk, then suppressive therapy.  CVA: +stroke on MRI. Neuro signed off.  ESRD: TTS HD.  SP CRRT 3/23- 3/26. Had HD 3/28, then the temp cath was removed for line holiday. On 4/1 VVS placed TDC and is now back on TTS schedule.  Volume - no edema LE's, on room air. Appears close to euvolemic. Wide variation in weights here, will try to get standing weight when he is able. BP elevated today but just took his AM meds a few minutes ago Anemia: Hb dropped from ongoing LGIB.  S/p CSY 09/15/22, Flex Sig 4/11.  Several bleeding ulcers addressed with endoscopy.  No heparin with HD. Inc'd ESA to higher dose.  Hgb now stable in 9's. Transfuse prn.  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 Cont po vdra and phoslo 2 ac tid as binder.  Dialysis access: AVG clotted 3/27, went for declot by VVS and then re-clotted. Temp cath removed 3/28. VVS placed new RIJ TDC on 4/1. Using [facility] with frequent clotting.  Can't use heparin. Less clotting last HD    Rogers Blocker, PA-C 09/20/2022, 8:58 AM  [facility] Pager: [phone]

## 2022-09-20 NOTE — Plan of Care (Signed)
  Problem: Education: Goal: Understanding of CV disease, CV risk reduction, and recovery process will improve Outcome: Progressing Goal: Individualized Educational Video(s) Outcome: Progressing   Problem: Activity: Goal: Ability to return to baseline activity level will improve Outcome: Progressing   Problem: Cardiovascular: Goal: Ability to achieve and maintain adequate cardiovascular perfusion will improve Outcome: Progressing Goal: Vascular access site(s) Level 0-1 will be maintained Outcome: Progressing   Problem: Health Behavior/Discharge Planning: Goal: Ability to safely manage health-related needs after discharge will improve Outcome: Progressing   Problem: Education: Goal: Knowledge of prescribed regimen will improve Outcome: Progressing   Problem: Activity: Goal: Ability to tolerate increased activity will improve Outcome: Progressing   Problem: Bowel/Gastric: Goal: Gastrointestinal status for postoperative course will improve Outcome: Progressing   Problem: Clinical Measurements: Goal: Postoperative complications will be avoided or minimized Outcome: Progressing Goal: Signs and symptoms of graft occlusion will improve Outcome: Progressing   Problem: Skin Integrity: Goal: Risk for impaired skin integrity will decrease Outcome: Progressing   Problem: Safety: Goal: Ability to remain free from injury will improve Outcome: Progressing

## 2022-09-20 NOTE — Consult Note (Signed)
Palliative Care Consult Note                                  Date: 09/20/2022   Patient Name: Albert Harris  DOB: Apr 10, 1956  MRN: [id number]  Age / Sex: 67 y.o., male  PCP: [name], NP Referring Physician: Leroy Sea, MD  Reason for Consultation: Establishing goals of care  HPI/Patient Profile: 67 y.o. male  with past medical history of ESRD on HD, peripheral vascular disease, chronic systolic CHF with EF 35 to 40%, and COPD.  He presented to [facility] on 08/28/2022 with abdominal pain, nausea, and unable to feel his left foot.  He was found to have a left femoral pseudoaneurysm and was taken to the OR for repair of his distal left external iliac artery.  He required emergent CRRT due to hypotension and hyperkalemia.  Hospitalization has been complicated by MRSA bacteremia secondary to post-op wound infection and recurrent rectal bleeding requiring multiple transfusions.   Palliative Medicine was consulted for goals of care in the setting of ESRD, severe vascular disease, and bleeding.    Subjective:   I have reviewed medical records including progress notes, labs and imaging.   I met with patient and his daughter/[name] at bedside  to discuss diagnosis, prognosis, GOC, disposition, and options. Patient is alert, oriented, and able to fully participate in GOC discussion.  Patient is known to PMT from his hospitalization in July 2022.  I re-introduced Palliative Medicine as specialized medical care for people living with serious illness. It focuses on providing relief from the symptoms and stress of a serious illness.   We discussed patient's current illness and what it means in the larger context of his ongoing co-morbidities.  Clinical status was reviewed. Natural disease trajectory of chronic illness was discussed, emphasizing it is progressive and non-curable.  Created space and opportunity for patient and family to express  thoughts regarding his current medical situation. Patient values and goals of care were attempted to be elicited.    Life Review: Albert Harris lives at home with his wife Darel Hong here in [place].  They have been married since 2014 and are both retired.  He previously worked in [employer] but has not been able to work in the past 2 years or so due to his health issues.  He has 3 children from his previous marriage - 2 daughters ([name] and [place]) and 1 son Casimiro Needle). He also has several grandchildren.   Functional Status: Prior to admission, Albert Harris reports he was ambulatory and independent with ADLs.    Goals: Medical stabilization. Patient and daughter understand he will likely need rehab to improve strength and mobility. They are hopeful he can ultimately return home.   Additional Discussion: Discussion had regarding the seriousness of patient's current medical situation. We discussed his severe vascular disease and that per vascular surgery, he is not a candidate for additional vascular intervention. We also discussed his issues with rectal bleeding, wound infection, and underlying ESRD.        Discussed that his prognosis is very guarded and he is high risk for ongoing complications secondary to his multiple comorbidities. Patient and daughter understand all this however remain open to all offered and available medical interventions to prolong life. They are ultimately hopeful for stabilization and improvement.    We did discuss code status. Encouraged consideration of DNR/DNI status understanding evidenced based poor outcomes in similar hospitalized  patients, as the cause of the arrest is likely associated with chronic/terminal disease rather than a reversible acute cardio-pulmonary event. I explained that DNR/DNI does not change the medical plan and it only comes into effect after a person has arrested (died).  Patient seems to understand prognosis would be poor in the event of a resuscitation event, but  he wishes to remain full code at this time.    Review of Systems  Neurological:  Positive for weakness.    Objective:   Primary Diagnoses: Present on Admission:  Lactic acidosis  Metabolic encephalopathy  Lower GI bleed   Physical Exam Vitals reviewed.  Constitutional:      General: He is not in acute distress.    Appearance: He is ill-appearing.  Pulmonary:     Effort: Pulmonary effort is normal.  Skin:    Comments: Left groin wound with wound VAC  Neurological:     Mental Status: He is alert and oriented to person, place, and time.     Motor: Weakness present.     Vital Signs:  BP (!) 181/68 (BP Location: Right Arm)   Pulse 62   Temp (!) 97.4 F (36.3 C) (Oral)   Resp 16   Ht 5\' 8"  (1.727 m)   Wt 67.3 kg Comment: BED  SpO2 100%   BMI 22.56 kg/m   Palliative Assessment/Data: PPS 30-40%     Assessment & Plan:   SUMMARY OF RECOMMENDATIONS   Full code Continue full scope interventions Goal of care - medical stabilization and improvement Spiritual care referral to assist with advanced directives Ongoing palliative support  This NP will follow-up when I return to service on 4/16. Please call [phone] for any urgent needs.   Primary Decision Maker: PATIENT  Advanced Directives: None currently. Patient is interested in completing HCPOA document. He wishes to document daughter Karen Kitchens as health care agent an spouse [name] as alternate.   Symptom Management:  Oxycodone IR 10 mg every 4 hours as needed for pain Tramadol 50 mg every 6 hours as needed for pain  Prognosis:  Unable to determine   Thank you for allowing [place] to participate in the care of Albert Harris  MDM - High  Signed by: Sherlean Foot, NP Palliative Medicine Team  Team Phone # [phone]  For individual providers, please see AMION

## 2022-09-20 NOTE — Progress Notes (Signed)
Progress Note    09/20/2022 8:13 AM 3 Days Post-Op  Subjective: Bottom hurting him today   Vitals:   09/20/22 0400 09/20/22 0800  BP: (!) 159/80 (!) 181/68  Pulse: 65 62  Resp: 17 16  Temp: 98.1 F (36.7 C) (!) 97.4 F (36.3 C)  SpO2: 98% 100%   Physical Exam: Cardiac:  regular Lungs:  non labored Incisions:  left groin incision with VAC with good seal Extremities: Warm, stable, no motor in the left foot Abdomen:  mildly tender LLQ Neurologic: alert and oriented  CBC    Component Value Date/Time   WBC 14.9 (H) 09/20/2022 0344   RBC 3.16 (L) 09/20/2022 0344   HGB 9.6 (L) 09/20/2022 0344   HGB 10.4 (L) 06/18/2021 1431   HCT 29.8 (L) 09/20/2022 0344   HCT 31.6 (L) 06/18/2021 1431   PLT 218 09/20/2022 0344   PLT 142 (L) 06/18/2021 1431   MCV 94.3 09/20/2022 0344   MCV 96 06/18/2021 1431   MCH 30.4 09/20/2022 0344   MCHC 32.2 09/20/2022 0344   RDW 18.7 (H) 09/20/2022 0344   RDW 14.3 06/18/2021 1431   LYMPHSABS 1.5 08/28/2022 1810   LYMPHSABS 1.5 06/18/2021 1431   MONOABS 0.7 08/28/2022 1810   EOSABS 0.1 08/28/2022 1810   EOSABS 1.0 (H) 06/18/2021 1431   BASOSABS 0.1 08/28/2022 1810   BASOSABS 0.1 06/18/2021 1431    BMET    Component Value Date/Time   NA 135 09/20/2022 0344   NA 141 06/18/2021 1431   K 3.8 09/20/2022 0344   CL 98 09/20/2022 0344   CO2 29 09/20/2022 0344   GLUCOSE 93 09/20/2022 0344   BUN 23 09/20/2022 0344   BUN 20 06/18/2021 1431   CREATININE 3.63 (H) 09/20/2022 0344   CREATININE 1.15 02/19/2017 1049   CALCIUM 7.9 (L) 09/20/2022 0344   CALCIUM 8.5 (L) 06/10/2020 0900   GFRNONAA 17 (L) 09/20/2022 0344   GFRAA 6 (L) 03/04/2020 1045    INR    Component Value Date/Time   INR 1.2 08/12/2022 1501     Intake/Output Summary (Last 24 hours) at 09/20/2022 0813 Last data filed at 09/20/2022 0800 Gross per 24 hour  Intake 561 ml  Output 2000 ml  Net -1439 ml      Assessment/Plan:  67 y.o. male is s/p iliofemoral bypass, removal  of infected patch with sartorius muscle flap 3 Days Post-Op   Left lower extremity unchanged. Faint Pt/ pero signals. Foot is warm but no motor  Next left groin VAC change will be on Monday. Encourage PO intake, no abdominal pain with PO intake, no concern for chronic mesenteric ischemia  Please check pt for sacral decubitus ulcer. Poor perfusion to the pelvis will make any wound difficult to heal  Regarding the rectal ulcerations eitiology unclear. No colonic ischemia noted on scope. There is some concern ulcers could be from ischemia, however Raed has had rectal ulcerations for quite some time.  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  The inferior mesenteric artery is patent.  Bilateral hypogastric arteries and the IMA provide perfusion to the rectum.  There is no way to recanalize the hypogastric arteries.  SMA stenting would only provide collateral blood flow.  This would require dual antiplatelet therapy, which Taequan cannot tolerate due to the bleeding episodes. I think any surgery at this point comes with considerable risk with [facility].  He would be best conservative with continued medical management.    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

## 2022-09-21 DIAGNOSIS — E46 Unspecified protein-calorie malnutrition: Secondary | ICD-10-CM

## 2022-09-21 DIAGNOSIS — R6521 Severe sepsis with septic shock: Secondary | ICD-10-CM | POA: Diagnosis not present

## 2022-09-21 DIAGNOSIS — K625 Hemorrhage of anus and rectum: Secondary | ICD-10-CM | POA: Diagnosis not present

## 2022-09-21 DIAGNOSIS — S31109A Unspecified open wound of abdominal wall, unspecified quadrant without penetration into peritoneal cavity, initial encounter: Secondary | ICD-10-CM

## 2022-09-21 DIAGNOSIS — Z992 Dependence on renal dialysis: Secondary | ICD-10-CM | POA: Diagnosis not present

## 2022-09-21 DIAGNOSIS — N186 End stage renal disease: Secondary | ICD-10-CM | POA: Diagnosis not present

## 2022-09-21 DIAGNOSIS — A419 Sepsis, unspecified organism: Secondary | ICD-10-CM | POA: Diagnosis not present

## 2022-09-21 DIAGNOSIS — K626 Ulcer of anus and rectum: Secondary | ICD-10-CM | POA: Diagnosis not present

## 2022-09-21 LAB — RENAL FUNCTION PANEL
Albumin: 1.6 g/dL — ABNORMAL LOW (ref 3.5–5.0)
Anion gap: 10 (ref 5–15)
BUN: 35 mg/dL — ABNORMAL HIGH (ref 8–23)
CO2: 27 mmol/L (ref 22–32)
Calcium: 8.5 mg/dL — ABNORMAL LOW (ref 8.9–10.3)
Chloride: 99 mmol/L (ref 98–111)
Creatinine, Ser: 5 mg/dL — ABNORMAL HIGH (ref 0.61–1.24)
GFR, Estimated: 12 mL/min — ABNORMAL LOW (ref >60)
Glucose, Bld: 85 mg/dL (ref 70–99)
Phosphorus: 2.6 mg/dL (ref 2.5–4.6)
Potassium: 3.8 mmol/L (ref 3.5–5.1)
Sodium: 136 mmol/L (ref 135–145)

## 2022-09-21 LAB — CBC
HCT: 29.7 % — ABNORMAL LOW (ref 39.0–52.0)
HCT: 29.4 % — ABNORMAL LOW (ref 39.0–52.0)
HCT: 28.1 % — ABNORMAL LOW (ref 39.0–52.0)
Hemoglobin: 9.3 g/dL — ABNORMAL LOW (ref 13.0–17.0)
Hemoglobin: 9.4 g/dL — ABNORMAL LOW (ref 13.0–17.0)
Hemoglobin: 9 g/dL — ABNORMAL LOW (ref 13.0–17.0)
MCH: 30.2 pg (ref 26.0–34.0)
MCH: 31.2 pg (ref 26.0–34.0)
MCH: 31.6 pg (ref 26.0–34.0)
MCHC: 31.3 g/dL (ref 30.0–36.0)
MCHC: 32 g/dL (ref 30.0–36.0)
MCHC: 32 g/dL (ref 30.0–36.0)
MCV: 96.4 fL (ref 80.0–100.0)
MCV: 97.7 fL (ref 80.0–100.0)
MCV: 98.6 fL (ref 80.0–100.0)
Platelets: 216 10*3/uL (ref 150–400)
Platelets: 233 10*3/uL (ref 150–400)
Platelets: 223 10*3/uL (ref 150–400)
RBC: 3.08 MIL/uL — ABNORMAL LOW (ref 4.22–5.81)
RBC: 3.01 MIL/uL — ABNORMAL LOW (ref 4.22–5.81)
RBC: 2.85 MIL/uL — ABNORMAL LOW (ref 4.22–5.81)
RDW: 20.2 % — ABNORMAL HIGH (ref 11.5–15.5)
RDW: 20.7 % — ABNORMAL HIGH (ref 11.5–15.5)
RDW: 20.6 % — ABNORMAL HIGH (ref 11.5–15.5)
WBC: 14.7 10*3/uL — ABNORMAL HIGH (ref 4.0–10.5)
WBC: 16.5 10*3/uL — ABNORMAL HIGH (ref 4.0–10.5)
WBC: 20 10*3/uL — ABNORMAL HIGH (ref 4.0–10.5)
nRBC: 0.1 % (ref 0.0–0.2)
nRBC: 0 % (ref 0.0–0.2)
nRBC: 0 % (ref 0.0–0.2)

## 2022-09-21 MED ORDER — OXYCODONE HCL 5 MG PO TABS
5.0000 mg | ORAL_TABLET | Freq: Four times a day (QID) | ORAL | Status: DC | PRN
  Administered 2022-09-21 – 2022-09-24 (×9): 10 mg via ORAL
  Filled 2022-09-21 (×9): qty 2

## 2022-09-21 MED ORDER — BUDESONIDE 3 MG PO CPEP
9.0000 mg | ORAL_CAPSULE | Freq: Every day | ORAL | Status: DC
  Administered 2022-09-21 – 2022-09-23 (×3): 9 mg via ORAL
  Filled 2022-09-21 (×4): qty 3

## 2022-09-21 MED ORDER — MESALAMINE 1000 MG RE SUPP
1000.0000 mg | Freq: Two times a day (BID) | RECTAL | Status: DC
  Administered 2022-09-21 – 2022-09-26 (×9): 1000 mg via RECTAL
  Filled 2022-09-21 (×16): qty 1

## 2022-09-21 MED ORDER — CHLORHEXIDINE GLUCONATE CLOTH 2 % EX PADS: 6.0000 | MEDICATED_PAD | Freq: Every day | CUTANEOUS | Status: DC

## 2022-09-21 MED ORDER — HYDROMORPHONE HCL 1 MG/ML IJ SOLN
0.2500 mg | INTRAMUSCULAR | Status: DC | PRN
  Administered 2022-09-21 – 2022-09-22 (×5): 0.25 mg via INTRAVENOUS
  Filled 2022-09-21 (×5): qty 0.5

## 2022-09-21 NOTE — Plan of Care (Signed)
  Problem: Education: Goal: Understanding of CV disease, CV risk reduction, and recovery process will improve Outcome: Progressing Goal: Individualized Educational Video(s) Outcome: Progressing   Problem: Activity: Goal: Ability to return to baseline activity level will improve Outcome: Progressing   Problem: Cardiovascular: Goal: Ability to achieve and maintain adequate cardiovascular perfusion will improve Outcome: Progressing Goal: Vascular access site(s) Level 0-1 will be maintained Outcome: Progressing   Problem: Health Behavior/Discharge Planning: Goal: Ability to safely manage health-related needs after discharge will improve Outcome: Progressing   Problem: Education: Goal: Knowledge of prescribed regimen will improve Outcome: Progressing   Problem: Activity: Goal: Ability to tolerate increased activity will improve Outcome: Progressing   Problem: Bowel/Gastric: Goal: Gastrointestinal status for postoperative course will improve Outcome: Progressing   Problem: Clinical Measurements: Goal: Postoperative complications will be avoided or minimized Outcome: Progressing Goal: Signs and symptoms of graft occlusion will improve Outcome: Progressing   Problem: Skin Integrity: Goal: Demonstration of wound healing without infection will improve Outcome: Progressing   Problem: Education: Goal: Knowledge of General Education information will improve Description: Including pain rating scale, medication(s)/side effects and non-pharmacologic comfort measures Outcome: Progressing   Problem: Health Behavior/Discharge Planning: Goal: Ability to manage health-related needs will improve Outcome: Progressing   Problem: Clinical Measurements: Goal: Ability to maintain clinical measurements within normal limits will improve Outcome: Progressing Goal: Will remain free from infection Outcome: Progressing Goal: Diagnostic test results will improve Outcome: Progressing Goal:  Respiratory complications will improve Outcome: Progressing Goal: Cardiovascular complication will be avoided Outcome: Progressing   Problem: Activity: Goal: Risk for activity intolerance will decrease Outcome: Progressing   Problem: Nutrition: Goal: Adequate nutrition will be maintained Outcome: Progressing   Problem: Coping: Goal: Level of anxiety will decrease Outcome: Progressing   Problem: Elimination: Goal: Will not experience complications related to bowel motility Outcome: Progressing Goal: Will not experience complications related to urinary retention Outcome: Progressing   Problem: Pain Managment: Goal: General experience of comfort will improve Outcome: Progressing   Problem: Safety: Goal: Ability to remain free from injury will improve Outcome: Progressing   Problem: Skin Integrity: Goal: Risk for impaired skin integrity will decrease Outcome: Progressing   

## 2022-09-21 NOTE — Progress Notes (Signed)
[facility] Progress Note   Subjective:   Delayed note - seen in room earlier today. C/o ankle and buttock pain, no CP/dyspnea.  Objective Vitals:   09/21/22 1000 09/21/22 1200 09/21/22 1400 09/21/22 1600  BP:  (!) 173/75  127/67  Pulse:  70 69 71  Resp: (!) 22 (!) 23  17  Temp:  97.8 F (36.6 C)  98.3 F (36.8 C)  TempSrc:  Oral  Oral  SpO2:  97% 98% 97%  Weight:      Height:       Physical Exam General: Better appearing man, NAD Heart: RRR; no murmur  Lungs: CTAB Extremities: no LE edema Dialysis Access: [facility]  Additional Objective Labs: Basic Metabolic Panel: Recent Labs  Lab 09/19/22 0745 09/20/22 0344 09/21/22 0437  NA 136 135 136  K 3.4* 3.8 3.8  CL 99 98 99  CO2 27 29 27   GLUCOSE 97 93 85  BUN 38* 23 35*  CREATININE 5.68* 3.63* 5.00*  CALCIUM 8.2* 7.9* 8.5*  PHOS 3.8 2.0* 2.6   Liver Function Tests: Recent Labs  Lab 09/19/22 0745 09/20/22 0344 09/21/22 0437  ALBUMIN 1.6* 1.7* 1.6*   No results for input(s): "LIPASE", "AMYLASE" in the last 168 hours. CBC: Recent Labs  Lab 09/19/22 0745 09/19/22 1501 09/20/22 0344 09/21/22 0437 09/21/22 1401  WBC 12.4* 14.3* 14.9* 14.7* 16.5*  HGB 7.8* 10.2* 9.6* 9.3* 9.4*  HCT 23.8* 30.8* 29.8* 29.7* 29.4*  MCV 94.4 92.5 94.3 96.4 97.7  PLT 194 196 218 216 233   Blood Culture    Component Value Date/Time   SDES TISSUE 09/09/2022 1453   SPECREQUEST VESSEL PT ON VANC ANCEF 09/09/2022 1453   CULT  09/09/2022 1453    RARE STAPHYLOCOCCUS AUREUS SUSCEPTIBILITIES PERFORMED ON PREVIOUS CULTURE WITHIN THE LAST 5 DAYS. NO ANAEROBES ISOLATED Performed at [facility], [address] [place], [place] [address]    REPTSTATUS 09/14/2022 FINAL 09/09/2022 1453    Cardiac Enzymes: No results for input(s): "CKTOTAL", "CKMB", "CKMBINDEX", "TROPONINI" in the last 168 hours. CBG: Recent Labs  Lab 09/15/22 0340 09/15/22 0749 09/15/22 1105 09/15/22 1554 09/16/22 0740  GLUCAP 88 88 82 110*  95   Iron Studies: No results for input(s): "IRON", "TIBC", "TRANSFERRIN", "FERRITIN" in the last 72 hours. @lablastinr3 @ Studies/Results: No results found. Medications:  sodium chloride Stopped (09/17/22 0805)   DAPTOmycin (CUBICIN) 600 mg in sodium chloride 0.9 % IVPB 124 mL/hr at 09/21/22 0456   DAPTOmycin (CUBICIN) 750 mg in sodium chloride 0.9 % IVPB 130 mL/hr at 09/21/22 0456   sodium chloride      (feeding supplement) PROSource Plus  30 mL Oral TID BM   acetaminophen  650 mg Oral Q6H   budesonide  9 mg Oral Daily   calcitRIOL  0.25 mcg Oral Q T,Th,Sa-HD   calcium acetate  1,334 mg Oral TID WC   carvedilol  3.125 mg Oral BID WC   Chlorhexidine Gluconate Cloth  6 each Topical Q0600   Chlorhexidine Gluconate Cloth  6 each Topical Q0600   darbepoetin (ARANESP) injection - DIALYSIS  200 mcg Subcutaneous Q Wed-1800   feeding supplement (NEPRO CARB STEADY)  237 mL Oral TID BM   leptospermum manuka honey  1 Application Topical Daily   liver oil-zinc oxide   Topical TID   mesalamine  1,000 mg Rectal BID   methocarbamol  500 mg Oral TID   multivitamin  1 tablet Oral QHS   pantoprazole  40 mg Oral Daily  polyethylene glycol  17 g Oral BID   rosuvastatin  10 mg Oral Daily   sodium chloride flush  10-40 mL Intracatheter Q12H    Dialysis Orders: TTS SW  4h   350/800  64.5kg  RFA AVG= clotted/ new TDC in place   Heparin none  - last OP HD 3/14, post wt 65.9kg - venofer  IV weekly - rocaltrol 0.25 mcg po tiw - phoslo 2 ac tid - no esa, last Hb 11.0   Assessment/Plan: Left common femoral artery pseudoaneurysm - sp repair of L EIA on 3/23. Went back to OR 4/3 for L CIA to profunda bypass, L CIA pta and stenting, and muscle flap (sartorious to groin). Still high risk of limb loss.   MRSA Bacteremia, VRE tissue cx - sp line holiday and getting IV daptomycin per ID/Pharmacy x 6wk, then suppressive therapy.  CVA: +stroke on MRI. Neuro signed off.  ESRD: TTS HD.  SP CRRT 3/23-  3/26. Had HD 3/28, then the temp cath was removed for line holiday. On 4/1 VVS placed TDC. Resume TTS schedule - HD tomorrow. BP/volume: Variable, no edema on exam. Anemia: Hb dropped from ongoing LGIB.  S/p CSY 09/15/22, Flex Sig 4/11.  Several bleeding ulcers addressed with endoscopy.  No heparin with HD. Inc'd ESA to higher dose.  Hgb now stable in 9's. Transfuse prn.  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 Cont po vdra and phoslo 2 ac tid as binder.  Dialysis access: AVG clotted 3/27, went for declot by VVS and then re-clotted. Temp cath removed 3/28. VVS placed new RIJ TDC on 4/1. Using [facility] with frequent clotting.  Can't use heparin. Less clotting last HD    Ozzie Hoyle, Cordelia Poche 09/21/2022, 7:13 PM  [employer]

## 2022-09-21 NOTE — Progress Notes (Signed)
This chaplain responded to PMT NP-Julia consult for creating Advance Directive:  HCPOA. The chaplain understands the Pt. chooses [facility] as [facility] as alternate Environmental health practitioner.  The chaplain introduced herself to the Pt. and understands the Pt. prefers to meet on Tuesday. The Pt. RN is returning soon with medication for pain.  Chaplain Stephanie Acre [phone]

## 2022-09-21 NOTE — Progress Notes (Signed)
Daily Progress Note  DOA: 08/28/2022 Hospital Day: 25 Chief Complaint: recurrent rectal bleeding   ASSESSMENT   Brief Narrative 67 yo male with pmh of HTN, PVD s/p R femoropopliteal bypass in March 2023, CHF, ESRD on HD, C-diff Jan 2024, Etoh use disorder, solitary rectal ulcer. Admitted for septic shock. We saw him in consult on 09/12/22 for bloody stools / worsening anemia. Flex sig showed multiple non-bleeding rectal ulcers. Subsequent full colonoscopy showed rectal ulcers and also a few ulcers in the ascending colon, cecum and ICV. After rebleeding he underwent another flex sig on 4/11 with findings of 2 large distal ulcers in the distal rectum teated with bipolar cautery. Hemostatic gel applied. - Diverticulosis in the sigmoid colon.   Recurrent hematochezia / rectal ulcers and right sided colon ulcers which may be Crohn's disease.   ESRD on HD  CHF  Left groin wound.    PLAN  -Obtain baseline fecal calprotectin -Stop carafate enemas -Start Canasa supp BID -Start Budesonide 9 mg daily for possible Crohn's disease.   Subjective / New events:   Two episodes of rectal bleeding today ( dark red blood). Mild LUQ abdominal discomfort.    Objective     Flexible sigmoidoscopy 09/17/22 - Preparation of the colon was poor and visualization past 30 cm was not achieved. - Hemorrhoids found on perianal exam. - Two large distal ulcers in the distal rectum. Injected. Treated with bipolar cautery. Hemostatic gel applied. - Diverticulosis in the sigmoid colon. - No specimens collected. - I suspect that the recurrent bleed was from the larger ulcer which was treated, although given the blood seen in the sigmoid colon also makes diverticular source possible. The patient has large rectal ulcers which will continue to be a potential source of bleeding. Given the size of the ulcers definitive hemostasis will not be possible with endoscopic therapy. but focal bleeding sites can be treated as  needed, as was done today and 2 days ago. - Etiology of rectal ulcers unclear, but Crohn's and ischemia on differential  FINAL MICROSCOPIC DIAGNOSIS:   A. CECUM AND ILEOCECAL VALVE, BIOPSY:  Chronic active colitis with ulcer slough (see comment)  Negative for dysplasia and granulomas   B. TRANSVERSE COLON, POLYPECTOMY:  Tubular adenoma  Negative for high-grade dysplasia and carcinoma   C. SIGMOID COLON, BIOPSY:  Mild mucosal ischemic injury (see comment)   D. RECTUM, ULCER, BIOPSY:  Reactive colonic mucosa with focal hyperplastic change  Negative for activity, ulcer, dysplasia and granulomas    Recent Labs    09/20/22 0344 09/21/22 0437 09/21/22 1401  WBC 14.9* 14.7* 16.5*  HGB 9.6* 9.3* 9.4*  HCT 29.8* 29.7* 29.4*  PLT 218 216 233   BMET Recent Labs    09/19/22 0745 09/20/22 0344 09/21/22 0437  NA 136 135 136  K 3.4* 3.8 3.8  CL 99 98 99  CO2 GLUCOSE 97 93 85  BUN 38* 23 35*  CREATININE 5.68* 3.63* 5.00*  CALCIUM 8.2* 7.9* 8.5*   LFT Recent Labs    09/21/22 0437  ALBUMIN 1.6*   PT/INR No results for input(s): "LABPROT", "INR" in the last 72 hours.   Scheduled inpatient medications:   (feeding supplement) PROSource Plus  30 mL Oral TID BM   acetaminophen  650 mg Oral Q6H   budesonide  9 mg Oral Daily   calcitRIOL  0.25 mcg Oral Q T,Th,Sa-HD   calcium acetate  1,334 mg Oral TID WC   carvedilol  3.125 mg Oral BID WC   Chlorhexidine Gluconate Cloth  6 each Topical Q0600   Chlorhexidine Gluconate Cloth  6 each Topical Q0600   darbepoetin (ARANESP) injection - DIALYSIS  200 mcg Subcutaneous Q Wed-1800   feeding supplement (NEPRO CARB STEADY)  237 mL Oral TID BM   leptospermum manuka honey  1 Application Topical Daily   liver oil-zinc oxide   Topical TID   mesalamine  1,000 mg Rectal BID   methocarbamol  500 mg Oral TID   multivitamin  1 tablet Oral QHS   pantoprazole  40 mg Oral Daily   polyethylene glycol  17 g Oral BID   rosuvastatin  10  mg Oral Daily   sodium chloride flush  10-40 mL Intracatheter Q12H   Continuous inpatient infusions:   sodium chloride Stopped (09/17/22 0805)   DAPTOmycin (CUBICIN) 600 mg in sodium chloride 0.9 % IVPB 124 mL/hr at 09/21/22 0456   DAPTOmycin (CUBICIN) 750 mg in sodium chloride 0.9 % IVPB 130 mL/hr at 09/21/22 0456   sodium chloride     PRN inpatient medications: alteplase, dicyclomine, docusate sodium, heparin, hydrALAZINE, HYDROmorphone (DILAUDID) injection, mouth rinse, oxyCODONE, pentafluoroprop-tetrafluoroeth, sodium chloride, traMADol  Vital signs in last 24 hours: Temp:  [97.6 F (36.4 C)-97.9 F (36.6 C)] 97.8 F (36.6 C) (04/15 1200) Pulse Rate:  [66-77] 70 (04/15 1200) Resp:  [13-23] 23 (04/15 1200) BP: (102-175)/(68-92) 173/75 (04/15 1200) SpO2:  [93 %-100 %] 97 % (04/15 1200) Last BM Date : 09/19/22  Intake/Output Summary (Last 24 hours) at 09/21/2022 1604 Last data filed at 09/21/2022 0456 Gross per 24 hour  Intake 437 ml  Output 120 ml  Net 317 ml    Intake/Output from previous day: 04/14 0701 - 04/15 0700 In: 502 [P.O.:200; NG/GT:237; IV Piggyback:65] Out: 120 [Drains:120] Intake/Output this shift: No intake/output data recorded.   Physical Exam:  General: Alert male in NAD Heart:  Regular rate and rhythm.  Pulmonary: Normal respiratory effort Abdomen: Soft, nondistended, nontender. Normal bowel sounds. Neurologic: Alert and oriented Psych: Pleasant. Cooperative. Insight appears normal.    Principal Problem:   Septic shock Active Problems:   Lower GI bleed   Metabolic encephalopathy   Lactic acidosis   ESRD (end stage renal disease) on dialysis   Malnutrition of moderate degree   Pseudoaneurysm of femoral artery   Decreased functional mobility   MRSA (methicillin resistant Staphylococcus aureus) septicemia   Pressure injury of skin   Colonic ulcer   Rectal arterial hemorrhage   Rectal ulcer     LOS: 24 days   Willette Cluster ,NP  09/21/2022, 4:04 PM

## 2022-09-21 NOTE — Progress Notes (Signed)
PROGRESS NOTE                                                                                                                                                                                                             Patient Demographics:    Albert Harris, is a 67 y.o. male, DOB - 1955/08/28, IRJ:[id number]  Outpatient Primary MD for the patient is [name], NP    LOS - 24  Admit date - 08/28/2022    Chief Complaint  Patient presents with   Abdominal Pain       Brief Narrative (HPI from H&P)   67 yo male brought to ER with abdominal pain, nausea, diaphoresis and unable to feel his Lt foot. Hypotensive with lactic acidosis in ER. 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 Hx of ESRD and had hyperkalemia. Had emergent CRRT. Vascular consulted and taken to OR underwent iliofemoral bypass on the left leg 08/28/22, some bleeding from the left groin and infection this was debrided and a wound VAC was placed by vascular surgery on 09/09/2022.    3/22 Admit, emergent CRRT, to OR for repair of distal Lt external iliac artery 3/24 Extubated. CT head obtained for altered mental status did not show any acute abnormalities 3/26 Off CRRT and pressors, Vascular surgery consulted for clotted AVG 3/27 AV graft thrombectomy attempted in OR but patient became hemodynamically unstable and procedure was aborted.  Was on pressors briefly 3/28 dialysis followed by CVC and aline removal for line holiday 3/29 MRI brain ordered for LLE weakness and found with right subinsular 68mm infarct. Neuro consulted 3/30 CT A/P increased hematoma in left pelvic region associated with Hg drop 9>7 4/1 hgb stable, TEE, new tunneled line planned 4/3 L groin bleeding. Taken to OR urgently by VVS for iliofemoral bypass, removal of infected patch, sartorius flap.  4/6, 4/7 ongoing rectal bleeding. Transfused both days.  4/6 flex sig  with multiple non-bleeding ulcers.  4/9 colonoscopy - 2 large ulcers in the distal rectum with a visible vessel and Hemoclip placed - few ulcers in the ascending colon, cecum, and at the ileocecal valve - hemorrhoids -biopsies sent  4/8 ongoing rectal bleeding. Transfusing again. Did not drink bowel prep for colonoscopy. Aborted.  PCCM called back.  4/11 two large bloody BM's overnight with Hgb drop from  7.8 to 6.5 with hypotension, moved back to ICU and transferred  4/11 seen by GI rectal bleeding site was cauterized bleeding was stabilized 09/18/2022 transferred to my care under TRH on day 22 of his hospital stay    Subjective:   Patient in bed, appears comfortable, denies any headache, no fever, no chest pain or pressure, no shortness of breath , no abdominal pain. No new focal weakness, improved L leg pain and low back pain.   Assessment  & Plan :    Peripheral vascular disease, ischemia of the left leg, Lt common femoral artery pseudoaneurysm seen by vascular surgery Dr. Roxan Hockey s/p iliofemoral bypass this admission, complicated by left groin bleeding and left groin infection.  Left groin infected patch removed has left groin wound VAC.  Received antibiotics.  Infection stable.  Is discussed with vascular surgeon Dr. Roxan Hockey on 09/18/2022 he is not a candidate for any further vascular intervention at all.  Unfortunately has lost all use of left lower extremity at some point it will be amputated.  Currently pain control and medical management only.  Retroperitoneal hemorrhage, stable - Initially had increased L flank pain, however clinically resolved no abdominal pain now.   Acute blood loss anemia secondary to hematochezia, Acute lower GI bleeding with multiple rectal bleeding ulcers, also had some retroperitoneal bleed. Flex sig 4/6 with 5 non-bleeding ulcers identified, He underwent colonoscopy which showed multiple rectal ulcers, one of the ulcers showed visible bleeding vessel, clips  applied and hemorrhoids, repeated episodes of hematochezia with hypotension overnight prompting return to ICU, Hgb 6.5, received 2 units of packed RBC on 09/16/2022, seen by GI was taken to Endo lab and bleeding sites were cauterized on 09/17/2022 with stabilization of bleeding, some fall likely from old blood loss on 09/19/2022 getting another unit of blood on 09/19/2022.  Case discussed with GI Dr. Tomasa Rand on 09/18/2018 for his risk of rebleeding from the rectal ulcers is very high, etiology of ulcers appears to be consistent with ischemia most likely considering his history of poor vascular health, continue to monitor H&H.  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 Faecium/VRE. [facility] 3/27 negative. Continue daptomycin on dialysis days, Infectious disease following.  Case discussed with Dr. Elinor Parkinson ID 09/18/22 - EOT is 10/23/22 - Dapto with HD. He has a fu with Dr Thedore Mins on 09/28/22.   ESRD, Hyponatremia, improved  -Nephrology is following, HD per nephrology via RIJ tunneled catheter. TTS    Chronic systolic CHF with EF 35 to 40% on Echo from 06/29/22. Hx of pulmonary hypertension.  Pressure is improved with placed on Coreg, Plavix held due to ongoing GI bleed.    Acute metabolic encephalopathy from uremia, acidosis, Acute 4mm right subinsular stroke -CT head on 3/24 was normal, Mental status has improved, left leg ischemia and loss of function.   Moderate malnutrition - Continue dietary supplements   COPD.  Stable.  Dyslipidemia.  Resumed statin.  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  Outpatient follow-up with pulmonary and GI to be arranged by PCP postdischarge.       Condition - Extremely Guarded  Family Communication  :  None present  Code Status :  Full  Consults  :  GI, VVS, ID, PCCM, palliative care  PUD Prophylaxis : PPI   Procedures  :            Disposition Plan  :  Status is:  Inpatient  DVT Prophylaxis  :    SCD's Start: 09/09/22 1841   Lab Results  Component Value Date   PLT 216 09/21/2022    Diet :  Diet Order             Diet renal/carb modified with fluid restriction Fluid restriction: 1200 mL Fluid; Room service appropriate? Yes; Fluid consistency: Thin  Diet effective now                    Inpatient Medications  Scheduled Meds:  (feeding supplement) PROSource Plus  30 mL Oral TID BM   acetaminophen  650 mg Oral Q6H   calcitRIOL  0.25 mcg Oral Q T,Th,Sa-HD   calcium acetate  1,334 mg Oral TID WC   carvedilol  3.125 mg Oral BID WC   Chlorhexidine Gluconate Cloth  6 each Topical Q0600   Chlorhexidine Gluconate Cloth  6 each Topical Q0600   darbepoetin (ARANESP) injection - DIALYSIS  200 mcg Subcutaneous Q Wed-1800   feeding supplement (NEPRO CARB STEADY)  237 mL Oral TID BM   leptospermum manuka honey  1 Application Topical Daily   liver oil-zinc oxide   Topical TID   methocarbamol  500 mg Oral TID   multivitamin  1 tablet Oral QHS   pantoprazole  40 mg Oral Daily   polyethylene glycol  17 g Oral BID   rosuvastatin  10 mg Oral Daily   sodium chloride flush  10-40 mL Intracatheter Q12H   sucralfate  2 g Rectal BID   Continuous Infusions:  sodium chloride Stopped (09/17/22 0805)   DAPTOmycin (CUBICIN) 600 mg in sodium chloride 0.9 % IVPB 124 mL/hr at 09/21/22 0456   DAPTOmycin (CUBICIN) 750 mg in sodium chloride 0.9 % IVPB 130 mL/hr at 09/21/22 0456   sodium chloride     PRN Meds:.alteplase, dicyclomine, docusate sodium, heparin, hydrALAZINE, HYDROmorphone (DILAUDID) injection, mouth rinse, oxyCODONE, pentafluoroprop-tetrafluoroeth, sodium chloride, traMADol     Objective:   Vitals:   09/20/22 2000 09/21/22 0000 09/21/22 0402 09/21/22 0757  BP: (!) 162/77 102/68  (!) 175/77  Pulse: 68 66  70  Resp: Temp: 97.6 F (36.4 C) 97.9 F (36.6 C)    TempSrc: Oral Oral Oral   SpO2: 100% 100%  100%  Weight:       Height:        Wt Readings from Last 3 Encounters:  09/19/22 67.3 kg  08/23/22 64.1 kg  08/15/22 64.9 kg     Intake/Output Summary (Last 24 hours) at 09/21/2022 0929 Last data filed at 09/21/2022 0456 Gross per 24 hour  Intake 437 ml  Output 120 ml  Net 317 ml     Physical Exam  Awake Alert, No new F.N deficits, Normal affect Jolly.AT,PERRAL Supple Neck, No JVD,   Symmetrical Chest wall movement, Good air movement bilaterally, CTAB RRR,No Gallops,Rubs or new Murmurs,  +ve B.Sounds, Abd Soft, No tenderness,   Left groin wound VAC in place, left lower extremity extremely weak,    RN pressure injury documentation: Pressure Injury 08/28/22 Sacrum Medial Unstageable - Full thickness tissue loss in which the base of the injury is covered by slough (yellow, tan, gray, green or brown) and/or eschar (tan, brown or black) in the wound bed. Sacral wound with eschar coveri (Active)  08/28/22 2228  Location: Sacrum  Location Orientation: Medial  Staging: Unstageable - Full thickness tissue loss in which the base of the injury is covered by  slough (yellow, tan, gray, green or brown) and/or eschar (tan, brown or black) in the wound bed.  Wound Description (Comments): Sacral wound with eschar covering wound bed  Present on Admission: Yes  Dressing Type Foam - Lift dressing to assess site every shift 09/21/22 0402     Pressure Injury 09/04/22 Heel Left Deep Tissue Pressure Injury - Purple or maroon localized area of discolored intact skin or blood-filled blister due to damage of underlying soft tissue from pressure and/or shear. (Active)  09/04/22 1200  Location: Heel  Location Orientation: Left  Staging: Deep Tissue Pressure Injury - Purple or maroon localized area of discolored intact skin or blood-filled blister due to damage of underlying soft tissue from pressure and/or shear.  Wound Description (Comments):   Present on Admission:   Dressing Type Foam - Lift dressing to assess site  every shift 09/21/22 0402      Data Review:    Recent Labs  Lab 09/18/22 2201 09/19/22 0745 09/19/22 1501 09/20/22 0344 09/21/22 0437  WBC 14.3* 12.4* 14.3* 14.9* 14.7*  HGB 8.2* 7.8* 10.2* 9.6* 9.3*  HCT 25.1* 23.8* 30.8* 29.8* 29.7*  PLT 183 194 196 218 216  MCV 93.7 94.4 92.5 94.3 96.4  MCH 30.6 31.0 30.6 30.4 30.2  MCHC 32.7 32.8 33.1 32.2 31.3  RDW 18.5* 18.4* 18.3* 18.7* 20.2*    Recent Labs  Lab 09/15/22 1501 09/16/22 0701 09/17/22 0139 09/18/22 0419 09/19/22 0745 09/20/22 0344 09/21/22 0437  NA  --    < > 136 136 136 135 136  K  --    < > 3.7 3.7 3.4* 3.8 3.8  CL  --    < > 101 98 99 98 99  CO2  --    < > 23 23 27 29 27   ANIONGAP  --    < > 12 15 10 8 10   GLUCOSE  --    < > 95 83 97 93 85  BUN  --    < > 44* 27* 38* 23 35*  CREATININE  --    < > 5.60* 3.85* 5.68* 3.63* 5.00*  ALBUMIN  --    < > 1.6* 1.7* 1.6* 1.7* 1.6*  CRP 10.0*  --   --   --   --   --   --   CALCIUM  --    < > 8.4* 8.2* 8.2* 7.9* 8.5*   < > = values in this interval not displayed.      Recent Labs  Lab 09/15/22 1501 09/16/22 0701 09/17/22 0139 09/18/22 0419 09/19/22 0745 09/20/22 0344 09/21/22 0437  CRP 10.0*  --   --   --   --   --   --   CALCIUM  --    < > 8.4* 8.2* 8.2* 7.9* 8.5*   < > = values in this interval not displayed.   Lab Results  Component Value Date   CHOL 94 09/05/2022   HDL 29 (L) 09/05/2022   LDLCALC 45 09/05/2022   TRIG 102 09/05/2022   CHOLHDL 3.2 09/05/2022    Lab Results  Component Value Date   HGBA1C 5.1 09/05/2022    Radiology Reports No results found.    Signature  -   Susa Raring M.D on 09/21/2022 at 9:29 AM   -  To page go to [email or web address]

## 2022-09-21 NOTE — Progress Notes (Addendum)
  Progress Note    09/21/2022 7:41 AM 4 Days Post-Op  Subjective:  bottom, side, groin all hurting    Vitals:   09/20/22 2000 09/21/22 0000  BP: (!) 162/77 102/68  Pulse: 68 66  Resp: 14 13  Temp: 97.6 F (36.4 C) 97.9 F (36.6 C)  SpO2: 100% 100%   Physical Exam: Cardiac:  regular  Lungs:  non labored Incisions:  left groin with VAC to suction, losing medial seal Extremities: Left leg unchanged, toes cool, ischemic changes stable. Faint Pt/ Pero signals. No motor. Right DP/ PT.  Right foot warm, motor intact Abdomen: soft, LLQ tender Neurologic: alert and oriented  CBC    Component Value Date/Time   WBC 14.7 (H) 09/21/2022 0437   RBC 3.08 (L) 09/21/2022 0437   HGB 9.3 (L) 09/21/2022 0437   HGB 10.4 (L) 06/18/2021 1431   HCT 29.7 (L) 09/21/2022 0437   HCT 31.6 (L) 06/18/2021 1431   PLT 216 09/21/2022 0437   PLT 142 (L) 06/18/2021 1431   MCV 96.4 09/21/2022 0437   MCV 96 06/18/2021 1431   MCH 30.2 09/21/2022 0437   MCHC 31.3 09/21/2022 0437   RDW 20.2 (H) 09/21/2022 0437   RDW 14.3 06/18/2021 1431   LYMPHSABS 1.5 08/28/2022 1810   LYMPHSABS 1.5 06/18/2021 1431   MONOABS 0.7 08/28/2022 1810   EOSABS 0.1 08/28/2022 1810   EOSABS 1.0 (H) 06/18/2021 1431   BASOSABS 0.1 08/28/2022 1810   BASOSABS 0.1 06/18/2021 1431    BMET    Component Value Date/Time   NA 136 09/21/2022 0437   NA 141 06/18/2021 1431   K 3.8 09/21/2022 0437   CL 99 09/21/2022 0437   CO2 27 09/21/2022 0437   GLUCOSE 85 09/21/2022 0437   BUN 35 (H) 09/21/2022 0437   BUN 20 06/18/2021 1431   CREATININE 5.00 (H) 09/21/2022 0437   CREATININE 1.15 02/19/2017 1049   CALCIUM 8.5 (L) 09/21/2022 0437   CALCIUM 8.5 (L) 06/10/2020 0900   GFRNONAA 12 (L) 09/21/2022 0437   GFRAA 6 (L) 03/04/2020 1045    INR    Component Value Date/Time   INR 1.2 08/12/2022 1501     Intake/Output Summary (Last 24 hours) at 09/21/2022 0741 Last data filed at 09/21/2022 0456 Gross per 24 hour  Intake 502 ml   Output 120 ml  Net 382 ml     Assessment/Plan:  67 y.o. male is s/p iliofemoral bypass, removal of infected patch with sartorius muscle flap 4 Days Post-Op   Left leg unchanged. Monophasic faint PT/Pero Will change left groin VAC later today Continue PT/ OT H&H stable Remains on Daptomycin Needs close monitoring and care for sacral decubitus ulcer Encourage po intake Appreciate CCM, Nephrology, ID assistance   Graceann Congress, [place] [facility] [phone] 09/21/2022 7:41 AM  VASCULAR STAFF ADDENDUM: I have independently interviewed and examined the patient. I agree with the above.  VAC change with improvement. Some devitalized tissue debrided. 500mg  Myriad Morcel powder placed to promote further granulation.     Fara Olden, MD [facility] Phone Number: [phone] 09/21/2022 1:27 PM

## 2022-09-21 NOTE — Hospital Course (Signed)
[facility] Progress Note   Subjective:  Seen in room - c/o ankle and buttock pain. No CP/dyspnea.   Objective Vitals:   09/20/22 2000 09/21/22 0000 09/21/22 0402 09/21/22 0757  BP: (!) 162/77 102/68  (!) 175/77  Pulse: 68 66  70  Resp: 14 13  17   Temp: 97.6 F (36.4 C) 97.9 F (36.6 C)    TempSrc: Oral Oral Oral   SpO2: 100% 100%  100%  Weight:      Height:       Physical Exam General: Well appearing man, NAD Heart: RRR; no murmur Lungs: CTAB Abdomen: soft Extremities: no LE edema Dialysis Access:  TDC + R forearm loop AVG + faint bruit  Additional Objective Labs: Basic Metabolic Panel: Recent Labs  Lab 09/19/22 0745 09/20/22 0344 09/21/22 0437  NA 136 135 136  K 3.4* 3.8 3.8  CL 99 98 99  CO2 27 29 27   GLUCOSE 97 93 85  BUN 38* 23 35*  CREATININE 5.68* 3.63* 5.00*  CALCIUM 8.2* 7.9* 8.5*  PHOS 3.8 2.0* 2.6   Liver Function Tests: Recent Labs  Lab 09/19/22 0745 09/20/22 0344 09/21/22 0437  ALBUMIN 1.6* 1.7* 1.6*   CBC: Recent Labs  Lab 09/18/22 2201 09/19/22 0745 09/19/22 1501 09/20/22 0344 09/21/22 0437  WBC 14.3* 12.4* 14.3* 14.9* 14.7*  HGB 8.2* 7.8* 10.2* 9.6* 9.3*  HCT 25.1* 23.8* 30.8* 29.8* 29.7*  MCV 93.7 94.4 92.5 94.3 96.4  PLT 183 194 196 218 216   Blood Culture    Component Value Date/Time   SDES TISSUE 09/09/2022 1453   SPECREQUEST VESSEL PT ON VANC ANCEF 09/09/2022 1453   CULT  09/09/2022 1453    RARE STAPHYLOCOCCUS AUREUS SUSCEPTIBILITIES PERFORMED ON PREVIOUS CULTURE WITHIN THE LAST 5 DAYS. NO ANAEROBES ISOLATED Performed at [facility], [address] [place], [place] [address]    REPTSTATUS 09/14/2022 FINAL 09/09/2022 1453   Medications:  sodium chloride Stopped (09/17/22 0805)   DAPTOmycin (CUBICIN) 600 mg in sodium chloride 0.9 % IVPB 124 mL/hr at 09/21/22 0456   DAPTOmycin (CUBICIN) 750 mg in sodium chloride 0.9 % IVPB 130 mL/hr at 09/21/22 0456   sodium chloride      (feeding  supplement) PROSource Plus  30 mL Oral TID BM   acetaminophen  650 mg Oral Q6H   calcitRIOL  0.25 mcg Oral Q T,Th,Sa-HD   calcium acetate  1,334 mg Oral TID WC   carvedilol  3.125 mg Oral BID WC   Chlorhexidine Gluconate Cloth  6 each Topical Q0600   Chlorhexidine Gluconate Cloth  6 each Topical Q0600   darbepoetin (ARANESP) injection - DIALYSIS  200 mcg Subcutaneous Q Wed-1800   feeding supplement (NEPRO CARB STEADY)  237 mL Oral TID BM   leptospermum manuka honey  1 Application Topical Daily   liver oil-zinc oxide   Topical TID   methocarbamol  500 mg Oral TID   multivitamin  1 tablet Oral QHS   pantoprazole  40 mg Oral Daily   polyethylene glycol  17 g Oral BID   rosuvastatin  10 mg Oral Daily   sodium chloride flush  10-40 mL Intracatheter Q12H   sucralfate  2 g Rectal BID    Dialysis Orders: TTS SW  4h   350/800  64.5kg  RFA AVG= clotted/ new TDC in place   Heparin none  - last OP HD 3/14, post wt 65.9kg - venofer 50mg  IV weekly - rocaltrol 0.25 mcg po tiw - phoslo  2 ac tid - no esa, last Hb 11.0   Assessment/Plan: Left common femoral artery pseudoaneurysm - s/p repair of L EIA on 3/23. Went back to OR 4/3 for L CIA to profunda bypass, L CIA pta and stenting, and muscle flap (sartorious to groin). Still high risk of limb loss.   MRSA Bacteremia, VRE tissue cx - s/p line holiday and getting IV daptomycin per ID/Pharmacy x 6wk, then suppressive therapy.  CVA: +stroke on MRI. Neuro signed off.  ESRD: TTS HD.  SP CRRT 3/23- 3/26. Had HD 3/28, then the temp cath was removed for line holiday. On 4/1 VVS placed TDC. Now back to TTS - next HD tomorrow. HTN/Volume: Variable BP, no edema on exam. UF as tolerated. Anemia: Hb dropped from ongoing LGIB.  S/p CSY 09/15/22, Flex Sig 4/11.  Several bleeding ulcers addressed with endoscopy.  No heparin with HD. Inc'd ESA to higher dose.  Hgb now stable in 9's. Transfuse prn.  Secondary HPTH: CCa and phos in range. Cont po vdra and phoslo 2 ac  tid as binder.  Dialysis access: AVG clotted 3/27, went for declot by VVS and then re-clotted. Temp cath removed 3/28. VVS placed new RIJ TDC on 4/1. Using [facility] with frequent clotting.  Can't use heparin. Less clotting last HD    Ozzie Hoyle, Cordelia Poche 09/21/2022, 11:27 AM  [employer]

## 2022-09-21 NOTE — Progress Notes (Signed)
Occupational Therapy Treatment Patient Details Name: Albert Harris MRN: [id number] DOB: 21-Feb-1956 Today's Date: 09/21/2022   History of present illness 67 year old male  ER with abdominal pain, nausea, diaphoresis and unable to feel his Lt foot. Hypotensive with lactic acidosis in ER.  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 CRRT 3/22-3/26.  3/22 repair of distal Lt external iliac artery. On 3/30, Hgb drop from 9 to 7 with worsening abdominal pain, CT abd 3/30 shows slightly larger anterior pelvic extraperitoneal hematoma. PMHx: PAD, ESRD HD, recently hospitalized 3/16-18 for acute on chronic left lower extremity ischemia.   OT comments  Pt progressing towards OT goals this session. Remains motivated and eager to work with therapy despite pain. Today his session was also limited by orthostatics (see below) Therapy team did don R shoe and Bil socks to assist with standing grip. No Darco shoe in room this session and daughter reports that it never arrived. Pt was max A for LB ADL, set up for grooming tasks, and withstood tilt bed for approx 20 min varying from 30-45 degrees, while performing HEP for UB and LB. OT plan remains appropriate and recommending post-acute OT at <3 hours per day to maximize safety and independence in ADL and functional transfers once cleared by vascular sx.  Initial BP: 161/92 (101) Tilting at 45 degrees: 79/64 (71) dizzy and light headed Tilting at 30 degrees: 87/56 (67) improving but still symptomatic Supine: 124/77 (90) HOB elevated   Recommendations for follow up therapy are one component of a multi-disciplinary discharge planning process, led by the attending physician.  Recommendations may be updated based on patient status, additional functional criteria and insurance authorization.    Assistance Recommended at Discharge Intermittent Supervision/Assistance  Patient can return home with the following  A lot of help with walking and/or  transfers;A lot of help with bathing/dressing/bathroom;Assistance with cooking/housework;Assist for transportation;Help with stairs or ramp for entrance   Equipment Recommendations  Other (comment) (defer to next venue)    Recommendations for Other Services      Precautions / Restrictions Precautions Precautions: Fall Precaution Comments: L groin wound vac; try to keep L hip in neutral position per vascular surgeon Restrictions Weight Bearing Restrictions: Yes LLE Weight Bearing: Weight bearing as tolerated Other Position/Activity Restrictions: decreased L hip flexion       Mobility Bed Mobility Overal bed mobility: Needs Assistance Bed Mobility: Rolling Rolling: Mod assist         General bed mobility comments: Patient remain in supine for tilt, did assist with rolling for pillow removal and placement at beginning and end of session    Transfers                         Balance                                           ADL either performed or assessed with clinical judgement   ADL Overall ADL's : Needs assistance/impaired     Grooming: Wash/dry face;Set up (tilt bed at 30 degrees)               Lower Body Dressing: Maximal assistance;Bed level Lower Body Dressing Details (indicate cue type and reason): to don socks and shoe (R) in preparation for standing, Pt initiating lifting of leg to assist - but mostly done by  OT               General ADL Comments: focused on tilting in bed to progress towards performing functional transfers in addition to BUE exercises    Extremity/Trunk Assessment Upper Extremity Assessment Upper Extremity Assessment: Generalized weakness            Vision       Perception     Praxis      Cognition Arousal/Alertness: Awake/alert Behavior During Therapy: WFL for tasks assessed/performed Overall Cognitive Status: Within Functional Limits for tasks assessed                                  General Comments: pleasant, eager to work, likes jokes        Exercises Other Exercises Other Exercises: BUE punching straight out x20 shoulder FF at 90 degrees Other Exercises: reviewed exercises with red theraband at bed level - daughter also verbalizing understanding. Explained that he should do these 3x a day (after each meal)    Shoulder Instructions       General Comments daughter Albert Harris present throughout session    Pertinent Vitals/ Pain       Pain Assessment Pain Assessment: 0-10 Pain Score: 9  Pain Location: buttocks and L leg Pain Descriptors / Indicators: Grimacing, Guarding, Sore, Constant Pain Intervention(s): Limited activity within patient's tolerance, Monitored during session, Repositioned, Patient requesting pain meds-RN notified  Home Living                                          Prior Functioning/Environment              Frequency  Min 2X/week        Progress Toward Goals  OT Goals(current goals can now be found in the care plan section)  Progress towards OT goals: Progressing toward goals  Acute Rehab OT Goals Patient Stated Goal: increase mobility OT Goal Formulation: With patient Time For Goal Achievement: 09/26/22 Potential to Achieve Goals: Good  Plan Discharge plan remains appropriate    Co-evaluation    PT/OT/SLP Co-Evaluation/Treatment: Yes Reason for Co-Treatment: Complexity of the patient's impairments (multi-system involvement);For patient/therapist safety;To address functional/ADL transfers PT goals addressed during session: Strengthening/ROM OT goals addressed during session: ADL's and self-care;Strengthening/ROM      AM-PAC OT "6 Clicks" Daily Activity     Outcome Measure   Help from another person eating meals?: A Little Help from another person taking care of personal grooming?: A Little Help from another person toileting, which includes using toliet, bedpan, or urinal?:  Total Help from another person bathing (including washing, rinsing, drying)?: A Lot Help from another person to put on and taking off regular upper body clothing?: A Lot Help from another person to put on and taking off regular lower body clothing?: A Lot 6 Click Score: 13    End of Session Equipment Utilized During Treatment: Other (comment) (tilt bed, red theraband)  OT Visit Diagnosis: Unsteadiness on feet (R26.81);Muscle weakness (generalized) (M62.81);Dizziness and giddiness (R42)   Activity Tolerance Patient tolerated treatment well (impacted by orthostatics/BP)   Patient Left in bed;with call bell/phone within reach;with nursing/sitter in room;with family/visitor present   Nurse Communication Patient requests pain meds;Weight bearing status        Time: 1610-9604 OT Time Calculation (min): 45 min  Charges:  OT General Charges $OT Visit: 1 Visit OT Treatments $Self Care/Home Management : 8-22 mins $Therapeutic Exercise: 8-22 mins  Nyoka Cowden Harris Acute Rehabilitation Services Office: [phone]  [facility] 09/21/2022, 10:58 AM

## 2022-09-21 NOTE — Progress Notes (Signed)
Physical Therapy Treatment Patient Details Name: Albert Harris MRN: [id number] DOB: May 01, 1956 Today's Date: 09/21/2022   History of Present Illness 67 year old male  ER with abdominal pain, nausea, diaphoresis and unable to feel his Lt foot. Hypotensive with lactic acidosis in ER.  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 CRRT 3/22-3/26.  3/22 repair of distal Lt external iliac artery. On 3/30, Hgb drop from 9 to 7 with worsening abdominal pain, CT abd 3/30 shows slightly larger anterior pelvic extraperitoneal hematoma. PMHx: PAD, ESRD HD, recently hospitalized 3/16-18 for acute on chronic left lower extremity ischemia.    PT Comments    Pt seen with OT in attempt to progress standing tolerance however pt limited by L LE pain during tilt and orthostatic hypotension. Pt in good spirits despite 9/10 burning pain from buttocks down L LE. Pt only made it up to 45 deg this date before BP dropped to 79/64 from 161/92. Pt still able to talk during session however reports "I may pass out" therefore PT/OT lowered bed. Pt mobility continues to be limited by pain and wound vac to L groin limiting the amount of L hip flexion. Continue to recommend inpatient rehab <3 hrs a day to progress back to indep mobility as he was PTA.    Recommendations for follow up therapy are one component of a multi-disciplinary discharge planning process, led by the attending physician.  Recommendations may be updated based on patient status, additional functional criteria and insurance authorization.  Follow Up Recommendations  Can patient physically be transported by private vehicle: No    Assistance Recommended at Discharge Frequent or constant Supervision/Assistance  Patient can return home with the following Assist for transportation;Two people to help with walking and/or transfers;Two people to help with bathing/dressing/bathroom;Assistance with cooking/housework;Help with stairs or ramp for  entrance   Equipment Recommendations  Other (comment) (tbd)    Recommendations for Other Services       Precautions / Restrictions Precautions Precautions: Fall Precaution Comments: L groin wound vac; try to keep L hip in neutral position per vascular surgeon Restrictions Weight Bearing Restrictions: Yes LLE Weight Bearing: Weight bearing as tolerated Other Position/Activity Restrictions: decreased L hip flexion     Mobility  Bed Mobility Overal bed mobility: Needs Assistance Bed Mobility: Rolling Rolling: Mod assist         General bed mobility comments: Patient remain in supine for tilt, did assist with rolling for pillow removal and placement at beginning and end of session, pt with good use of UEs to aide in rolling both L/R Angle: 45 degrees Total Minutes in Angle: 10 minutes Patient Response: Cooperative  Transfers Overall transfer level: Needs assistance Equipment used: Rolling walker (2 wheels) Transfers: Sit to/from Stand Sit to Stand: +2 physical assistance, +2 safety/equipment, Total assist           General transfer comment: tilt bed used to promote standing/Wbing but minimize L hip flexion    Ambulation/Gait                   Stairs             Wheelchair Mobility    Modified Rankin (Stroke Patients Only)       Balance  Cognition Arousal/Alertness: Awake/alert Behavior During Therapy: WFL for tasks assessed/performed Overall Cognitive Status: Within Functional Limits for tasks assessed                                 General Comments: pleasant, eager to work, likes jokes, engaging with PT/OT despite increased pain and orthostatic hypotension        Exercises General Exercises - Upper Extremity Elbow Flexion: AROM, Both, 10 reps, Supine Elbow Extension: AROM, Both, 10 reps, Supine Wrist Flexion: AROM, Both, 10 reps, Supine Wrist Extension:  AROM, Both, 10 reps, Supine General Exercises - Lower Extremity Ankle Circles/Pumps: Supine, AAROM, Both, 10 reps (no active DF LLE) Quad Sets: Both, Standing, AROM, 10 reps    General Comments General comments (skin integrity, edema, etc.): dtr [name] present t/o session. Pt did become orthostatic limiting tilt to 45 deg, BP dropped from 161/92 down to 78/64, back up to 87/56 at 35 deg tilt, and then 124/77 when returned bed to flat      Pertinent Vitals/Pain Pain Assessment Pain Assessment: 0-10 Pain Score: 9  Pain Location: buttocks and L leg Pain Descriptors / Indicators: Grimacing, Guarding, Sore, Constant Pain Intervention(s): Limited activity within patient's tolerance    Home Living                          Prior Function            PT Goals (current goals can now be found in the care plan section) Acute Rehab PT Goals Patient Stated Goal: Pt wants to get better and go home. PT Goal Formulation: With patient/family Time For Goal Achievement: 09/30/22 Potential to Achieve Goals: Fair Progress towards PT goals: Progressing toward goals    Frequency    Min 1X/week      PT Plan Discharge plan needs to be updated    Co-evaluation PT/OT/SLP Co-Evaluation/Treatment: Yes Reason for Co-Treatment: Complexity of the patient's impairments (multi-system involvement);For patient/therapist safety;To address functional/ADL transfers PT goals addressed during session: Strengthening/ROM        AM-PAC PT "6 Clicks" Mobility   Outcome Measure  Help needed turning from your back to your side while in a flat bed without using bedrails?: A Little Help needed moving from lying on your back to sitting on the side of a flat bed without using bedrails?: Total Help needed moving to and from a bed to a chair (including a wheelchair)?: Total Help needed standing up from a chair using your arms (e.g., wheelchair or bedside chair)?: Total Help needed to walk in hospital  room?: Total Help needed climbing 3-5 steps with a railing? : Total 6 Click Score: 8    End of Session Equipment Utilized During Treatment: Other (comment) (tilt bed) Activity Tolerance: Patient limited by pain Patient left: in bed;with call bell/phone within reach;with nursing/sitter in room;with family/visitor present Nurse Communication: Mobility status;Patient requests pain meds PT Visit Diagnosis: Other abnormalities of gait and mobility (R26.89);Muscle weakness (generalized) (M62.81);Difficulty in walking, not elsewhere classified (R26.2)     Time: 4540-9811 PT Time Calculation (min) (ACUTE ONLY): 45 min  Charges:  $Therapeutic Exercise: 8-22 mins                     Lewis Shock, PT, DPT Acute Rehabilitation Services Secure chat preferred Office #: [phone]    Iona Hansen 09/21/2022, 2:07 PM

## 2022-09-22 DIAGNOSIS — A419 Sepsis, unspecified organism: Secondary | ICD-10-CM | POA: Diagnosis not present

## 2022-09-22 DIAGNOSIS — K626 Ulcer of anus and rectum: Secondary | ICD-10-CM | POA: Diagnosis not present

## 2022-09-22 DIAGNOSIS — R6521 Severe sepsis with septic shock: Secondary | ICD-10-CM | POA: Diagnosis not present

## 2022-09-22 DIAGNOSIS — K625 Hemorrhage of anus and rectum: Secondary | ICD-10-CM | POA: Diagnosis not present

## 2022-09-22 DIAGNOSIS — N186 End stage renal disease: Secondary | ICD-10-CM | POA: Diagnosis not present

## 2022-09-22 DIAGNOSIS — Z992 Dependence on renal dialysis: Secondary | ICD-10-CM | POA: Diagnosis not present

## 2022-09-22 LAB — TYPE AND SCREEN: Unit division: 0

## 2022-09-22 LAB — RENAL FUNCTION PANEL
Albumin: 1.7 g/dL — ABNORMAL LOW (ref 3.5–5.0)
Anion gap: 13 (ref 5–15)
BUN: 46 mg/dL — ABNORMAL HIGH (ref 8–23)
CO2: 24 mmol/L (ref 22–32)
Calcium: 9 mg/dL (ref 8.9–10.3)
Chloride: 98 mmol/L (ref 98–111)
Creatinine, Ser: 6.12 mg/dL — ABNORMAL HIGH (ref 0.61–1.24)
GFR, Estimated: 9 mL/min — ABNORMAL LOW (ref >60)
Glucose, Bld: 110 mg/dL — ABNORMAL HIGH (ref 70–99)
Phosphorus: 3.3 mg/dL (ref 2.5–4.6)
Potassium: 5 mmol/L (ref 3.5–5.1)
Sodium: 135 mmol/L (ref 135–145)

## 2022-09-22 LAB — BPAM RBC
Blood Product Expiration Date: 202404232359
ISSUE DATE / TIME: 202404160903

## 2022-09-22 LAB — CBC
HCT: 21.8 % — ABNORMAL LOW (ref 39.0–52.0)
HCT: 34.9 % — ABNORMAL LOW (ref 39.0–52.0)
Hemoglobin: 7 g/dL — ABNORMAL LOW (ref 13.0–17.0)
Hemoglobin: 11.2 g/dL — ABNORMAL LOW (ref 13.0–17.0)
MCH: 31.4 pg (ref 26.0–34.0)
MCH: 29.7 pg (ref 26.0–34.0)
MCHC: 32.1 g/dL (ref 30.0–36.0)
MCHC: 32.1 g/dL (ref 30.0–36.0)
MCV: 97.8 fL (ref 80.0–100.0)
MCV: 92.6 fL (ref 80.0–100.0)
Platelets: 263 10*3/uL (ref 150–400)
Platelets: 186 10*3/uL (ref 150–400)
RBC: 2.23 MIL/uL — ABNORMAL LOW (ref 4.22–5.81)
RBC: 3.77 MIL/uL — ABNORMAL LOW (ref 4.22–5.81)
RDW: 20.3 % — ABNORMAL HIGH (ref 11.5–15.5)
RDW: 22.5 % — ABNORMAL HIGH (ref 11.5–15.5)
WBC: 21.9 10*3/uL — ABNORMAL HIGH (ref 4.0–10.5)
WBC: 20.7 10*3/uL — ABNORMAL HIGH (ref 4.0–10.5)
nRBC: 0 % (ref 0.0–0.2)
nRBC: 0 % (ref 0.0–0.2)

## 2022-09-22 LAB — PREPARE RBC (CROSSMATCH)

## 2022-09-22 LAB — CK: Total CK: 26 U/L — ABNORMAL LOW (ref 49–397)

## 2022-09-22 MED ORDER — HYDROMORPHONE HCL 1 MG/ML IJ SOLN
0.5000 mg | INTRAMUSCULAR | Status: DC | PRN
  Administered 2022-09-22 – 2022-09-23 (×5): 0.5 mg via INTRAVENOUS
  Filled 2022-09-22 (×5): qty 0.5

## 2022-09-22 MED ORDER — SODIUM CHLORIDE 0.9% IV SOLUTION: Freq: Once | INTRAVENOUS | Status: AC

## 2022-09-22 NOTE — Progress Notes (Signed)
This chaplain is revisiting to create/update the Pt. Advance Directive.   The Pt. is appreciative of the visit but prefers to wait for his daughter's guidance on his AD. The Pt. describes his pain as a "9" on a scale of 1-10. The chaplain updated the Pt. RN before leaving the unit. The chaplain will plan a F/U on Wednesday.  Chaplain Stephanie Acre [phone]

## 2022-09-22 NOTE — Progress Notes (Signed)
Pt states a six on the 1 to 10 scale for pain in his right great toe and his sacral area---Diluadid 0.25mg  IV given per order---will monitor for effectiveness

## 2022-09-22 NOTE — Progress Notes (Signed)
Physical Therapy Wound Treatment Patient Details  Name: Albert Harris MRN: [id number] Date of Birth: 10/11/1955  Today's Date: 09/22/2022 Time: 4098-1191 Time Calculation (min): 40 min  Subjective  Subjective Assessment Subjective: Pt pleasant and agreeable to wound therapy Patient and Family Stated Goals: Heal wound Date of Onset:  (unknown) Prior Treatments: Dressing changes  Pain Score:   10/10 as RN providing pain medication  Wound Assessment  Pressure Injury 08/28/22 Sacrum Medial Unstageable - Full thickness tissue loss in which the base of the injury is covered by slough (yellow, tan, gray, green or brown) and/or eschar (tan, brown or black) in the wound bed. Sacral wound with eschar coveri (Active)  Wound Image   09/22/22 1511  Dressing Type Foam - Lift dressing to assess site every shift;Honey;Gauze (Comment);Barrier Film (skin prep) 09/22/22 1511  Dressing Changed 09/22/22 1511  Dressing Change Frequency Every 3 days 09/22/22 1511  State of Healing Early/partial granulation 09/22/22 1511  Site / Wound Assessment Black;Brown;Red;Pink;Yellow 09/22/22 1511  % Wound base Red or Granulating 40% 09/22/22 1511  % Wound base Yellow/Fibrinous Exudate 25% 09/22/22 1511  % Wound base Black/Eschar 30% 09/22/22 1511  % Wound base Other/Granulation Tissue (Comment) 5% 09/22/22 1511  Peri-wound Assessment Erythema (non-blanchable);Excoriated 09/22/22 1511  Wound Length (cm) 9.7 cm 09/22/22 1511  Wound Width (cm) 10.1 cm 09/22/22 1511  Wound Depth (cm) 1.4 cm 09/22/22 1511  Wound Surface Area (cm^2) 97.97 cm^2 09/22/22 1511  Wound Volume (cm^3) 137.16 cm^3 09/22/22 1511  Tunneling (cm) 0 09/14/22 1344  Undermining (cm) 1:00 0.8 09/22/22 1511  Margins Unattached edges (unapproximated) 09/22/22 1511  Drainage Amount Minimal 09/22/22 1511  Drainage Description Serosanguineous 09/22/22 1511  Treatment Debridement (Selective);Irrigation 09/22/22 1511      Selective Debridement  (non-excisional) Selective Debridement (non-excisional) - Location: Sacrum Selective Debridement (non-excisional) - Tools Used: Forceps, Scalpel Selective Debridement (non-excisional) - Tissue Removed: yellow and black nonviable tissue    Wound Assessment and Plan  Wound Therapy - Assess/Plan/Recommendations Wound Therapy - Clinical Statement: Progressing with debridement.  This patient will benefit from continued wound therapy for selective removal of unviable tissue, to decrease bioburden, and promote wound bed healing. Wound Therapy - Functional Problem List: Decreased tolerance for repositioning and OOB due to wound; global weakness in the setting of extended hospitalization and now ICU stay. Factors Delaying/Impairing Wound Healing: Immobility, Multiple medical problems, Polypharmacy Hydrotherapy Plan: Debridement, Dressing change, Patient/family education Wound Therapy - Frequency: 2X / week Wound Therapy - Follow Up Recommendations: dressing changes by RN  Wound Therapy Goals- Improve the function of patient's integumentary system by progressing the wound(s) through the phases of wound healing (inflammation - proliferation - remodeling) by: Wound Therapy Goals - Improve the function of patient's integumentary system by progressing the wound(s) through the phases of wound healing by: Decrease Necrotic Tissue to: 20% Decrease Necrotic Tissue - Progress: Progressing toward goal Increase Granulation Tissue to: 80% Increase Granulation Tissue - Progress: Progressing toward goal Goals/treatment plan/discharge plan were made with and agreed upon by patient/family: Yes Time For Goal Achievement: 7 days Wound Therapy - Potential for Goals: Good  Goals will be updated until maximal potential achieved or discharge criteria met.  Discharge criteria: when goals achieved, discharge from hospital, MD decision/surgical intervention, no progress towards goals, refusal/missing three consecutive  treatments without notification or medical reason.  GP     Charges PT Wound Care Charges $Wound Debridement up to 20 cm: < or equal to 20 cm $ Wound Debridement each add'l 20 sqcm:  5 $PT Hydrotherapy Visit: 1 Visit      [name], PT Acute Rehabilitation Services  Office [phone]   [name] 09/22/2022, 3:18 PM

## 2022-09-22 NOTE — Plan of Care (Signed)
  Problem: Education: Goal: Understanding of CV disease, CV risk reduction, and recovery process will improve Outcome: Progressing Goal: Individualized Educational Video(s) Outcome: Progressing   Problem: Activity: Goal: Ability to return to baseline activity level will improve Outcome: Progressing   Problem: Cardiovascular: Goal: Ability to achieve and maintain adequate cardiovascular perfusion will improve Outcome: Progressing Goal: Vascular access site(s) Level 0-1 will be maintained Outcome: Progressing   Problem: Health Behavior/Discharge Planning: Goal: Ability to safely manage health-related needs after discharge will improve Outcome: Progressing   Problem: Education: Goal: Knowledge of prescribed regimen will improve Outcome: Progressing   Problem: Activity: Goal: Ability to tolerate increased activity will improve Outcome: Progressing   Problem: Bowel/Gastric: Goal: Gastrointestinal status for postoperative course will improve Outcome: Progressing   Problem: Clinical Measurements: Goal: Postoperative complications will be avoided or minimized Outcome: Progressing Goal: Signs and symptoms of graft occlusion will improve Outcome: Progressing   Problem: Skin Integrity: Goal: Demonstration of wound healing without infection will improve Outcome: Progressing   Problem: Education: Goal: Knowledge of General Education information will improve Description: Including pain rating scale, medication(s)/side effects and non-pharmacologic comfort measures Outcome: Progressing   Problem: Health Behavior/Discharge Planning: Goal: Ability to manage health-related needs will improve Outcome: Progressing   Problem: Clinical Measurements: Goal: Ability to maintain clinical measurements within normal limits will improve Outcome: Progressing Goal: Will remain free from infection Outcome: Progressing Goal: Diagnostic test results will improve Outcome: Progressing Goal:  Respiratory complications will improve Outcome: Progressing Goal: Cardiovascular complication will be avoided Outcome: Progressing   Problem: Activity: Goal: Risk for activity intolerance will decrease Outcome: Progressing   Problem: Nutrition: Goal: Adequate nutrition will be maintained Outcome: Progressing   Problem: Coping: Goal: Level of anxiety will decrease Outcome: Progressing   Problem: Elimination: Goal: Will not experience complications related to bowel motility Outcome: Progressing Goal: Will not experience complications related to urinary retention Outcome: Progressing   Problem: Pain Managment: Goal: General experience of comfort will improve Outcome: Progressing   Problem: Safety: Goal: Ability to remain free from injury will improve Outcome: Progressing   Problem: Skin Integrity: Goal: Risk for impaired skin integrity will decrease Outcome: Progressing   Problem: Education: Goal: Ability to identify signs and symptoms of gastrointestinal bleeding will improve Outcome: Progressing   Problem: Bowel/Gastric: Goal: Will show no signs and symptoms of gastrointestinal bleeding Outcome: Progressing   Problem: Fluid Volume: Goal: Will show no signs and symptoms of excessive bleeding Outcome: Progressing   Problem: Clinical Measurements: Goal: Complications related to the disease process, condition or treatment will be avoided or minimized Outcome: Progressing

## 2022-09-22 NOTE — Progress Notes (Signed)
  Progress Note    09/22/2022 7:27 AM 5 Days Post-Op  Subjective:  buttock and left foot pain    Vitals:   09/22/22 0000 09/22/22 0400  BP: 128/82 138/69  Pulse: 80 67  Resp: 16 17  Temp: 98.3 F (36.8 C) 98.1 F (36.7 C)  SpO2: 96% 95%   Physical Exam: Cardiac:  regular Lungs:  non labored Incisions:  left groin with VAC to suction  Extremities:  Left leg warm, unchanged ischemic changes Abdomen:  soft, non distended Neurologic: alert and oriented  CBC    Component Value Date/Time   WBC 21.9 (H) 09/22/2022 0433   RBC 2.23 (L) 09/22/2022 0433   HGB 7.0 (L) 09/22/2022 0433   HGB 10.4 (L) 06/18/2021 1431   HCT 21.8 (L) 09/22/2022 0433   HCT 31.6 (L) 06/18/2021 1431   PLT 263 09/22/2022 0433   PLT 142 (L) 06/18/2021 1431   MCV 97.8 09/22/2022 0433   MCV 96 06/18/2021 1431   MCH 31.4 09/22/2022 0433   MCHC 32.1 09/22/2022 0433   RDW 20.3 (H) 09/22/2022 0433   RDW 14.3 06/18/2021 1431   LYMPHSABS 1.5 08/28/2022 1810   LYMPHSABS 1.5 06/18/2021 1431   MONOABS 0.7 08/28/2022 1810   EOSABS 0.1 08/28/2022 1810   EOSABS 1.0 (H) 06/18/2021 1431   BASOSABS 0.1 08/28/2022 1810   BASOSABS 0.1 06/18/2021 1431    BMET    Component Value Date/Time   NA 135 09/22/2022 0331   NA 141 06/18/2021 1431   K 5.0 09/22/2022 0331   CL 98 09/22/2022 0331   CO2 24 09/22/2022 0331   GLUCOSE 110 (H) 09/22/2022 0331   BUN 46 (H) 09/22/2022 0331   BUN 20 06/18/2021 1431   CREATININE 6.12 (H) 09/22/2022 0331   CREATININE 1.15 02/19/2017 1049   CALCIUM 9.0 09/22/2022 0331   CALCIUM 8.5 (L) 06/10/2020 0900   GFRNONAA 9 (L) 09/22/2022 0331   GFRAA 6 (L) 03/04/2020 1045    INR    Component Value Date/Time   INR 1.2 08/12/2022 1501     Intake/Output Summary (Last 24 hours) at 09/22/2022 0727 Last data filed at 09/22/2022 0646 Gross per 24 hour  Intake 100 ml  Output 250 ml  Net -150 ml     Assessment/Plan:  67 y.o. male is s/p iliofemoral bypass, removal of infected  patch with sartorius muscle flap  5 Days Post-Op   Left leg remains warm, ischemic changes unchanged.  Left groin wound VAC with good seal Continue PT/ OT Further rectal bleeding yesterday Hgb 7 this morning. 2 units PRBC ordered WBC 21K this morning Remains on Daptomycin Needs close monitoring and care for sacral decubitus ulcer Encourage po intake HD today  Appreciate CCM, Nephrology, ID assistance   Graceann Congress, [facility] [phone] 09/22/2022 7:27 AM

## 2022-09-22 NOTE — Progress Notes (Signed)
   09/22/22 1221  Vitals  Temp 98.9 F (37.2 C)  Pulse Rate 66  Resp 14  BP 127/70  SpO2 98 %  O2 Device Room Air  Weight  (unable to obtain, RN aware)  Type of Weight Post-Dialysis  Oxygen Therapy  Patient Activity (if Appropriate) In bed  Pulse Oximetry Type Continuous  Oximetry Probe Site Changed No  Post Treatment  Dialyzer Clearance Lightly streaked  Duration of HD Treatment -hour(s) 3.24 hour(s)  Hemodialysis Intake (mL) 0 mL  Liters Processed 74.3  Fluid Removed (mL) 2500 mL  Tolerated HD Treatment Yes   Received patient in bed to unit.  Alert and oriented.  Informed consent signed and in chart.   TX duration:3.25  Patient tolerated well.  Transported back to the room  Alert, without acute distress.  Hand-off given to patient's nurse.   Access used: [facility] Access issues: no complications  Total UF removed: 2500 Medication(s) given: dilaudid IV   [facility]

## 2022-09-22 NOTE — Progress Notes (Signed)
Progress Note  Primary GI: Unassigned  LOS: 25 days   Chief Complaint: Recurrent hematochezia   Subjective  Patient states he has not had any rectal bleeding since yesterday.  Feels he is overall doing well.  Denies abdominal pain, nausea, vomiting.  No family was present at the time of my evaluation.   Objective   Vital signs in last 24 hours: Temp:  [97.8 F (36.6 C)-98.9 F (37.2 C)] 98.9 F (37.2 C) (04/16 1221) Pulse Rate:  [25-162] 66 (04/16 1221) Resp:  [14-20] 14 (04/16 1221) BP: (75-168)/(44-98) 127/70 (04/16 1221) SpO2:  [95 %-100 %] 98 % (04/16 1221) Weight:  [67.6 kg] 67.6 kg (04/16 0500) Last BM Date : 09/22/22 Last BM recorded by nurses in past 5 days Stool Type: Type 6 (Mushy consistency with ragged edges) (09/21/2022  8:00 PM)  General:   male in no acute distress  Heart:  Regular rate and rhythm; no murmurs Pulm: Clear anteriorly; no wheezing Abdomen: soft, nondistended, normal bowel sounds in all quadrants. Nontender without guarding. No organomegaly appreciated. Extremities:  No edema Neurologic:  Alert and  oriented x4;  No focal deficits.  Psych:  Cooperative. Normal mood and affect.  Intake/Output from previous day: 04/15 0701 - 04/16 0700 In: 100 [P.O.:100] Out: 250 [Drains:250] Intake/Output this shift: Total I/O In: 372 [Blood:372] Out: 2500 [Other:2500]  Studies/Results: No results found.  Lab Results: Recent Labs    09/21/22 1401 09/21/22 1934 09/22/22 0433  WBC 16.5* 20.0* 21.9*  HGB 9.4* 9.0* 7.0*  HCT 29.4* 28.1* 21.8*  PLT 233 223 263   BMET Recent Labs    09/20/22 0344 09/21/22 0437 09/22/22 0331  NA 135 136 135  K 3.8 3.8 5.0  CL 98 99 98  CO2 GLUCOSE 93 85 110*  BUN 23 35* 46*  CREATININE 3.63* 5.00* 6.12*  CALCIUM 7.9* 8.5* 9.0   LFT Recent Labs    09/22/22 0331  ALBUMIN 1.7*   PT/INR No results for input(s): "LABPROT", "INR" in the last 72 hours.   Scheduled Meds:  (feeding  supplement) PROSource Plus  30 mL Oral TID BM   acetaminophen  650 mg Oral Q6H   budesonide  9 mg Oral Daily   calcium acetate  1,334 mg Oral TID WC   carvedilol  3.125 mg Oral BID WC   Chlorhexidine Gluconate Cloth  6 each Topical Q0600   Chlorhexidine Gluconate Cloth  6 each Topical Q0600   darbepoetin (ARANESP) injection - DIALYSIS  200 mcg Subcutaneous Q Wed-1800   feeding supplement (NEPRO CARB STEADY)  237 mL Oral TID BM   leptospermum manuka honey  1 Application Topical Daily   liver oil-zinc oxide   Topical TID   mesalamine  1,000 mg Rectal BID   methocarbamol  500 mg Oral TID   multivitamin  1 tablet Oral QHS   pantoprazole  40 mg Oral Daily   polyethylene glycol  17 g Oral BID   rosuvastatin  10 mg Oral Daily   sodium chloride flush  10-40 mL Intracatheter Q12H   Continuous Infusions:  sodium chloride Stopped (09/17/22 0805)   DAPTOmycin (CUBICIN) 600 mg in sodium chloride 0.9 % IVPB 124 mL/hr at 09/21/22 0456   DAPTOmycin (CUBICIN) 750 mg in sodium chloride 0.9 % IVPB 130 mL/hr at 09/21/22 0456   sodium chloride        Patient profile:   67 year old male with ESRD on HD, admitted with septic shock from  MRSA bacteria from a left groin wound with bleeding/retroperitoneal hematoma s/p L common iliac to profunda femoris bypass and L common iliac artery angioplasty (09/09/2022) with recurrent lower GI bleed with rectal ulcers, left-sided diverticulosis, possible new onset Crohn's disease with right sided colonic ulceration.   WORKUP: Flex sig 4/7 with multiple rectal ulcers without stigmata. CT-A 4/7 negative for active bleeding.    Colonoscopy 09/15/2022: 2 large ulcers in distal rectum, clip placed. Ulcers in ascending colon, cecum, ileocecal valve, biopsied. Diverticulosis in descending and sigmoid colon. Inflamed mucosa in sigmoid colon, biopsied. 2 mm polyp in distal transverse colon, resected. Nonbleeding internal hemorrhoids.    Flex sigmoidoscopy 4/11: 2 large distal  ulcers in distal rectum, injected. treated with bipolar cautery.  Hemostatic gel applied.  Diverticulosis in sigmoid.  No specimens collected   Impression:   Recurrent Hematochezia; likely secondary rectal ulcers/Crohn's - hgb 7.0 (down from 9.0 yesterday) No further bleeding at this time, decreased hgb could be from significant rectal bleeding that occurred yesterday. Currently receiving 1 unit pRBCs. White count slightly elevated at 21.9 (likely from steroid use).  ESRD on HD CHF Left groin wound   Plan:   - fecal calprotectin ordered to obtain baseline - continue canasa suppositories BID - Continue budesonide.  Valla Pacey Leanna Sato  09/22/2022, 12:43 PM

## 2022-09-22 NOTE — Progress Notes (Addendum)
PROGRESS NOTE                                                                                                                                                                                                             Patient Demographics:    Albert Harris, is a 67 y.o. male, DOB - [birth year], NWG:[id number]  Outpatient Primary MD for the patient is [name], NP    LOS - 25  Admit date - 08/28/2022    Chief Complaint  Patient presents with   Abdominal Pain       Brief Narrative (HPI from H&P)   67 yo male brought to ER with abdominal pain, nausea, diaphoresis and unable to feel his Lt foot. Hypotensive with lactic acidosis in ER. 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 Hx of ESRD and had hyperkalemia. Had emergent CRRT. Vascular consulted and taken to OR underwent iliofemoral bypass on the left leg 08/28/22, some bleeding from the left groin and infection this was debrided and a wound VAC was placed by vascular surgery on 09/09/2022.    3/22 Admit, emergent CRRT, to OR for repair of distal Lt external iliac artery 3/24 Extubated. CT head obtained for altered mental status did not show any acute abnormalities 3/26 Off CRRT and pressors, Vascular surgery consulted for clotted AVG 3/27 AV graft thrombectomy attempted in OR but patient became hemodynamically unstable and procedure was aborted.  Was on pressors briefly 3/28 dialysis followed by CVC and aline removal for line holiday 3/29 MRI brain ordered for LLE weakness and found with right subinsular 4mm infarct. Neuro consulted 3/30 CT A/P increased hematoma in left pelvic region associated with Hg drop 9>7 4/1 hgb stable, TEE, new tunneled line planned 4/3 L groin bleeding. Taken to OR urgently by VVS for iliofemoral bypass, removal of infected patch, sartorius flap.  4/6, 4/7 ongoing rectal bleeding. Transfused both days.  4/6 flex sig  with multiple non-bleeding ulcers.  4/9 colonoscopy - 2 large ulcers in the distal rectum with a visible vessel and Hemoclip placed - few ulcers in the ascending colon, cecum, and at the ileocecal valve - hemorrhoids -biopsies sent  4/8 ongoing rectal bleeding. Transfusing again. Did not drink bowel prep for colonoscopy. Aborted.  PCCM called back.  4/11 two large bloody BM's overnight with Hgb drop from  7.8 to 6.5 with hypotension, moved back to ICU and transferred  4/11 flex Sig - seen by GI rectal bleeding site was cauterized bleeding was stabilized 09/18/2022 transferred to my care under TRH on day 22 of his hospital stay    Subjective:   Patient in bed, appears comfortable, denies any headache, no fever, no chest pain or pressure, no shortness of breath , no abdominal pain. No new focal weakness, improved L leg pain and low back pain, some blood per rectum.   Assessment  & Plan :    Peripheral vascular disease, ischemia of the left leg, Lt common femoral artery pseudoaneurysm seen by vascular surgery Dr. Roxan Hockey s/p iliofemoral bypass this admission, complicated by left groin bleeding and left groin infection.  Left groin infected patch removed has left groin wound VAC.  Received antibiotics.  Infection stable.  Is discussed with vascular surgeon Dr. Roxan Hockey on 09/18/2022 he is not a candidate for any further vascular intervention at all.  Unfortunately has lost all use of left lower extremity at some point it will be amputated.  Currently pain control and medical management only.  Retroperitoneal hemorrhage, stable - Initially had increased L flank pain, however clinically resolved no abdominal pain now.   Acute blood loss anemia secondary to hematochezia, Acute lower GI bleeding with multiple rectal bleeding ulcers, also had some retroperitoneal bleed. Underwent colonoscopy along with later flex sig by [facility],  He underwent colonoscopy which showed multiple rectal ulcers, one of the  ulcers showed visible bleeding vessel, clips applied and hemorrhoids, repeated episodes of hematochezia with hypotension overnight prompting return to ICU, Hgb 6.5, received 2 units of packed RBC on 09/16/2022, seen by GI again on 09/17/2022 underwent flex sig rectal ulcer with bleeding sites were cauterized on 09/17/2022 with stabilization of bleeding, per GI ulcers Ischemic vs IBD.  Fortunately intermittent lower GI bleeding continues, requiring intermittent PRBCs, 2 units 09/16/22, 1 unit 09/19/22, 2 more 09/22/22 - GI on board. Monitor.    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 Faecium/VRE. [facility] 3/27 negative. Continue daptomycin on dialysis days, Infectious disease following.  Case discussed with Dr. Elinor Parkinson ID 09/18/22 - EOT is 10/23/22 - Dapto with HD. He has a appointement with Dr Thedore Mins on 09/28/22.   ESRD, Hyponatremia, improved  -Nephrology is following, HD per nephrology via RIJ tunneled catheter. TTS    Chronic systolic CHF with EF 35 to 40% on Echo from 06/29/22. Hx of pulmonary hypertension.  Pressure is improved with placed on Coreg, Plavix held due to ongoing GI bleed.    Acute metabolic encephalopathy from uremia, acidosis, Acute 4mm right subinsular stroke -CT head on 3/24 was normal, Mental status has improved, left leg ischemia and loss of function.   Moderate malnutrition - Continue dietary supplements   COPD.  Stable.  Dyslipidemia.  Resumed statin.  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  Outpatient follow-up with pulmonary and GI to be arranged by PCP postdischarge.       Condition - Extremely Guarded  Family Communication  :  None present  Code Status :  Full  Consults  :  GI, VVS, ID, PCCM, palliative care  PUD Prophylaxis : PPI   Procedures  :            Disposition Plan  :    Status is: Inpatient  DVT Prophylaxis  :    SCD's Start:  09/09/22 1841   Lab Results   Component Value Date   PLT 263 09/22/2022    Diet :  Diet Order             Diet renal/carb modified with fluid restriction Fluid restriction: 1200 mL Fluid; Room service appropriate? Yes; Fluid consistency: Thin  Diet effective now                    Inpatient Medications  Scheduled Meds:  (feeding supplement) PROSource Plus  30 mL Oral TID BM   acetaminophen  650 mg Oral Q6H   budesonide  9 mg Oral Daily   calcitRIOL  0.25 mcg Oral Q T,Th,Sa-HD   calcium acetate  1,334 mg Oral TID WC   carvedilol  3.125 mg Oral BID WC   Chlorhexidine Gluconate Cloth  6 each Topical Q0600   Chlorhexidine Gluconate Cloth  6 each Topical Q0600   darbepoetin (ARANESP) injection - DIALYSIS  200 mcg Subcutaneous Q Wed-1800   feeding supplement (NEPRO CARB STEADY)  237 mL Oral TID BM   leptospermum manuka honey  1 Application Topical Daily   liver oil-zinc oxide   Topical TID   mesalamine  1,000 mg Rectal BID   methocarbamol  500 mg Oral TID   multivitamin  1 tablet Oral QHS   pantoprazole  40 mg Oral Daily   polyethylene glycol  17 g Oral BID   rosuvastatin  10 mg Oral Daily   sodium chloride flush  10-40 mL Intracatheter Q12H   Continuous Infusions:  sodium chloride Stopped (09/17/22 0805)   DAPTOmycin (CUBICIN) 600 mg in sodium chloride 0.9 % IVPB 124 mL/hr at 09/21/22 0456   DAPTOmycin (CUBICIN) 750 mg in sodium chloride 0.9 % IVPB 130 mL/hr at 09/21/22 0456   sodium chloride     PRN Meds:.alteplase, dicyclomine, docusate sodium, heparin, hydrALAZINE, HYDROmorphone (DILAUDID) injection, mouth rinse, oxyCODONE, pentafluoroprop-tetrafluoroeth, sodium chloride, traMADol     Objective:   Vitals:   09/21/22 2013 09/22/22 0000 09/22/22 0400 09/22/22 0500  BP: 111/60 128/82 138/69   Pulse: 67 80 67   Resp: Temp: 98.1 F (36.7 C) 98.3 F (36.8 C) 98.1 F (36.7 C)   TempSrc: Oral Oral Oral   SpO2: 97% 96% 95%   Weight:    67.6 kg  Height:        Wt Readings from  Last 3 Encounters:  09/22/22 67.6 kg  08/23/22 64.1 kg  08/15/22 64.9 kg     Intake/Output Summary (Last 24 hours) at 09/22/2022 0756 Last data filed at 09/22/2022 0646 Gross per 24 hour  Intake 100 ml  Output 250 ml  Net -150 ml     Physical Exam  Awake Alert, No new F.N deficits, Normal affect [place].AT,PERRAL Supple Neck, No JVD,   Symmetrical Chest wall movement, Good air movement bilaterally, CTAB RRR,No Gallops,Rubs or new Murmurs,  +ve B.Sounds, Abd Soft, No tenderness,   Left groin wound VAC in place, left lower extremity extremely weak,    RN pressure injury documentation: Pressure Injury 08/28/22 Sacrum Medial Unstageable - Full thickness tissue loss in which the base of the injury is covered by slough (yellow, tan, gray, green or brown) and/or eschar (tan, brown or black) in the wound bed. Sacral wound with eschar coveri (Active)  08/28/22 2228  Location: Sacrum  Location Orientation: Medial  Staging: Unstageable - Full thickness tissue loss in which the base of the injury is covered by slough (yellow, tan,  gray, green or brown) and/or eschar (tan, brown or black) in the wound bed.  Wound Description (Comments): Sacral wound with eschar covering wound bed  Present on Admission: Yes  Dressing Type Foam - Lift dressing to assess site every shift 09/21/22 0402     Pressure Injury 09/04/22 Heel Left Deep Tissue Pressure Injury - Purple or maroon localized area of discolored intact skin or blood-filled blister due to damage of underlying soft tissue from pressure and/or shear. (Active)  09/04/22 1200  Location: Heel  Location Orientation: Left  Staging: Deep Tissue Pressure Injury - Purple or maroon localized area of discolored intact skin or blood-filled blister due to damage of underlying soft tissue from pressure and/or shear.  Wound Description (Comments):   Present on Admission:   Dressing Type Foam - Lift dressing to assess site every shift;Gauze (Comment) 09/21/22  2000      Data Review:    Recent Labs  Lab 09/20/22 0344 09/21/22 0437 09/21/22 1401 09/21/22 1934 09/22/22 0433  WBC 14.9* 14.7* 16.5* 20.0* 21.9*  HGB 9.6* 9.3* 9.4* 9.0* 7.0*  HCT 29.8* 29.7* 29.4* 28.1* 21.8*  PLT 218 216 233 223 263  MCV 94.3 96.4 97.7 98.6 97.8  MCH 30.4 30.2 31.2 31.6 31.4  MCHC 32.2 31.3 32.0 32.0 32.1  RDW 18.7* 20.2* 20.7* 20.6* 20.3*    Recent Labs  Lab 09/15/22 1501 09/16/22 0701 09/18/22 0419 09/19/22 0745 09/20/22 0344 09/21/22 0437 09/22/22 0331  NA  --    < > 136 136 135 136 135  K  --    < > 3.7 3.4* 3.8 3.8 5.0  CL  --    < > 98 99 98 99 98  CO2  --    < > ANIONGAP  --    < > GLUCOSE  --    < > 83 97 93 85 110*  BUN  --    < > 27* 38* 23 35* 46*  CREATININE  --    < > 3.85* 5.68* 3.63* 5.00* 6.12*  ALBUMIN  --    < > 1.7* 1.6* 1.7* 1.6* 1.7*  CRP 10.0*  --   --   --   --   --   --   CALCIUM  --    < > 8.2* 8.2* 7.9* 8.5* 9.0   < > = values in this interval not displayed.      Recent Labs  Lab 09/15/22 1501 09/16/22 0701 09/18/22 0419 09/19/22 0745 09/20/22 0344 09/21/22 0437 09/22/22 0331  CRP 10.0*  --   --   --   --   --   --   CALCIUM  --    < > 8.2* 8.2* 7.9* 8.5* 9.0   < > = values in this interval not displayed.   Lab Results  Component Value Date   CHOL 94 09/05/2022   HDL 29 (L) 09/05/2022   LDLCALC 45 09/05/2022   TRIG 102 09/05/2022   CHOLHDL 3.2 09/05/2022    Lab Results  Component Value Date   HGBA1C 5.1 09/05/2022    Radiology Reports No results found.    Signature  -   Susa Raring M.D on 09/22/2022 at 7:56 AM   -  To page go to [email or web address]

## 2022-09-22 NOTE — Progress Notes (Signed)
Albert Harris Progress Note   Subjective:  Seen on HD - 2.6L UFG and tolerating. More GI bleeding last night. Will be getting 2U PRBCs while on HD as well. He denies CP/dyspnea this AM.  Objective Vitals:   09/22/22 0909 09/22/22 0920 09/22/22 0930 09/22/22 1000  BP: (!) 163/76 (!) 112/53 (!) 112/53 (!) 143/77  Pulse: 64 70 (!) 25 66  Resp: Temp: 98.4 F (36.9 C) 98.1 F (36.7 C)    TempSrc: Oral Oral    SpO2: 98% 96% 96% 99%  Weight:      Height:       Physical Exam General: Well appearing man, NAD. Heart: RRR; no murmur Lungs: CTA anteriorly Abdomen: soft Extremities: no LE edema Dialysis Access: [facility]  Additional Objective Labs: Basic Metabolic Panel: Recent Labs  Lab 09/20/22 0344 09/21/22 0437 09/22/22 0331  NA 135 136 135  K 3.8 3.8 5.0  CL 98 99 98  CO2 GLUCOSE 93 85 110*  BUN 23 35* 46*  CREATININE 3.63* 5.00* 6.12*  CALCIUM 7.9* 8.5* 9.0  PHOS 2.0* 2.6 3.3   Liver Function Tests: Recent Labs  Lab 09/20/22 0344 09/21/22 0437 09/22/22 0331  ALBUMIN 1.7* 1.6* 1.7*   CBC: Recent Labs  Lab 09/20/22 0344 09/21/22 0437 09/21/22 1401 09/21/22 1934 09/22/22 0433  WBC 14.9* 14.7* 16.5* 20.0* 21.9*  HGB 9.6* 9.3* 9.4* 9.0* 7.0*  HCT 29.8* 29.7* 29.4* 28.1* 21.8*  MCV 94.3 96.4 97.7 98.6 97.8  PLT 218 216 233 223 263   Cardiac Enzymes: Recent Labs  Lab 09/22/22 0331  CKTOTAL 26*   CBG: Recent Labs  Lab 09/15/22 1105 09/15/22 1554 09/16/22 0740  GLUCAP 82 110* 95   Medications:  sodium chloride Stopped (09/17/22 0805)   DAPTOmycin (CUBICIN) 600 mg in sodium chloride 0.9 % IVPB 124 mL/hr at 09/21/22 0456   DAPTOmycin (CUBICIN) 750 mg in sodium chloride 0.9 % IVPB 130 mL/hr at 09/21/22 0456   sodium chloride      (feeding supplement) PROSource Plus  30 mL Oral TID BM   acetaminophen  650 mg Oral Q6H   budesonide  9 mg Oral Daily   calcitRIOL  0.25 mcg Oral Q T,Th,Sa-HD   calcium acetate  1,334 mg  Oral TID WC   carvedilol  3.125 mg Oral BID WC   Chlorhexidine Gluconate Cloth  6 each Topical Q0600   Chlorhexidine Gluconate Cloth  6 each Topical Q0600   darbepoetin (ARANESP) injection - DIALYSIS  200 mcg Subcutaneous Q Wed-1800   feeding supplement (NEPRO CARB STEADY)  237 mL Oral TID BM   leptospermum manuka honey  1 Application Topical Daily   liver oil-zinc oxide   Topical TID   mesalamine  1,000 mg Rectal BID   methocarbamol  500 mg Oral TID   multivitamin  1 tablet Oral QHS   pantoprazole  40 mg Oral Daily   polyethylene glycol  17 g Oral BID   rosuvastatin  10 mg Oral Daily   sodium chloride flush  10-40 mL Intracatheter Q12H    Dialysis Orders: TTS SW  4h   350/800  64.5kg  RFA AVG -> clotted/ new TDC in place Heparin none  - last OP HD 3/14, post wt 65.9kg - venofer  IV weekly - rocaltrol 0.25 mcg po tiw - phoslo 2 ac tid - no esa, last Hb 11.0   Assessment/Plan: Left common femoral artery pseudoaneurysm - s/p L femoral exploration with  pseudoaneurysm repair 08/29/22, then iliofemoral bypass, removal of infected patch with sartorius muscle flap on 09/09/22.  Still high risk of limb loss.   MRSA Bacteremia, VRE tissue Cx 4/3: S/p line holiday and getting IV daptomycin per ID/Pharmacy x 6wk, then suppressive therapy.  CVA: + stroke on MRI. Neuro signed off.  ESRD: Continue HD on TTS schedule, did require short-term CRRT 3/23- 3/26. HD now. BP/volume: Variable, no edema on exam. Anemia (GIB/ABLA): Ongoing LGIB.  S/p CSY 09/15/22, Flex Sig 4/11.  Several bleeding ulcers addressed with endoscopy.  No heparin with HD. Transfusing again today - 2U. Also on Aranesp q Wed (max dose) Secondary HPTH: CorrCa high, Phos ok. Hold VDRA for now. Will work to get him off  Phoslo as outpatient - can continue for now. Dialysis access: AVG clotted 3/27, went for declot by VVS and then re-clotted. VVS placed R TDC on 4/1.   Ozzie Hoyle, PA-C 09/22/2022, 10:22 AM  [employer]

## 2022-09-22 NOTE — Progress Notes (Signed)
Mr. Dutko, As discussed with you in the hospital, the biopsies of the ulcers in your colon were nonspecific, but could be related to Crohn's disease.  You are being started on medications in the hospital (mesalamine) that can help treat Crohn's disease.   You will need to follow up with once you are out of the hospital to consider other treatments and consider further testing.

## 2022-09-23 DIAGNOSIS — R6521 Severe sepsis with septic shock: Secondary | ICD-10-CM | POA: Diagnosis not present

## 2022-09-23 DIAGNOSIS — N186 End stage renal disease: Secondary | ICD-10-CM | POA: Diagnosis not present

## 2022-09-23 DIAGNOSIS — A419 Sepsis, unspecified organism: Secondary | ICD-10-CM | POA: Diagnosis not present

## 2022-09-23 DIAGNOSIS — A4102 Sepsis due to Methicillin resistant Staphylococcus aureus: Secondary | ICD-10-CM | POA: Diagnosis not present

## 2022-09-23 DIAGNOSIS — K633 Ulcer of intestine: Secondary | ICD-10-CM

## 2022-09-23 DIAGNOSIS — M79605 Pain in left leg: Secondary | ICD-10-CM | POA: Diagnosis not present

## 2022-09-23 DIAGNOSIS — I724 Aneurysm of artery of lower extremity: Secondary | ICD-10-CM | POA: Diagnosis not present

## 2022-09-23 LAB — BPAM RBC
ISSUE DATE / TIME: 202404171149
Unit Type and Rh: 5100

## 2022-09-23 LAB — CBC
HCT: 31.9 % — ABNORMAL LOW (ref 39.0–52.0)
Hemoglobin: 10.6 g/dL — ABNORMAL LOW (ref 13.0–17.0)
MCH: 30.1 pg (ref 26.0–34.0)
MCHC: 33.2 g/dL (ref 30.0–36.0)
MCV: 90.6 fL (ref 80.0–100.0)
Platelets: 188 10*3/uL (ref 150–400)
RBC: 3.52 MIL/uL — ABNORMAL LOW (ref 4.22–5.81)
RDW: 22.4 % — ABNORMAL HIGH (ref 11.5–15.5)
WBC: 16.9 10*3/uL — ABNORMAL HIGH (ref 4.0–10.5)
nRBC: 0 % (ref 0.0–0.2)

## 2022-09-23 LAB — TYPE AND SCREEN: Unit division: 0

## 2022-09-23 LAB — PREPARE RBC (CROSSMATCH)

## 2022-09-23 LAB — RENAL FUNCTION PANEL
Albumin: 1.7 g/dL — ABNORMAL LOW (ref 3.5–5.0)
Anion gap: 11 (ref 5–15)
BUN: 36 mg/dL — ABNORMAL HIGH (ref 8–23)
CO2: 27 mmol/L (ref 22–32)
Calcium: 8.4 mg/dL — ABNORMAL LOW (ref 8.9–10.3)
Chloride: 97 mmol/L — ABNORMAL LOW (ref 98–111)
Creatinine, Ser: 4.59 mg/dL — ABNORMAL HIGH (ref 0.61–1.24)
GFR, Estimated: 13 mL/min — ABNORMAL LOW (ref >60)
Glucose, Bld: 107 mg/dL — ABNORMAL HIGH (ref 70–99)
Phosphorus: 3.9 mg/dL (ref 2.5–4.6)
Potassium: 3.9 mmol/L (ref 3.5–5.1)
Sodium: 135 mmol/L (ref 135–145)

## 2022-09-23 MED ORDER — SODIUM CHLORIDE 0.9% IV SOLUTION: Freq: Once | INTRAVENOUS | Status: AC

## 2022-09-23 MED ORDER — SODIUM CHLORIDE 0.9 % IV BOLUS
500.0000 mL | INTRAVENOUS | Status: AC
  Administered 2022-09-24: 500 mL via INTRAVENOUS

## 2022-09-23 MED ORDER — NOREPINEPHRINE 4 MG/250ML-% IV SOLN
0.0000 ug/min | INTRAVENOUS | Status: DC
  Administered 2022-09-24: 2 ug/min via INTRAVENOUS
  Administered 2022-09-24: 5 ug/min via INTRAVENOUS
  Administered 2022-09-24: 2 ug/min via INTRAVENOUS
  Filled 2022-09-23 (×2): qty 250

## 2022-09-23 NOTE — Progress Notes (Signed)
This chaplain is with the Pt., notary, witnesses, Pt. daughter-[name], and Pt. mother for the notarizing of the Pt. Advance Directive, HCPOA only. The PMT NP-Julia provided Pt. education and prepared the document with the Pt.  The Pt chose Albert Harris as his healthcare agent. If this person is unable or unwilling to serve as healthcare agent, the Pt. next choice is [name].  The chaplain returned the Pt. original AD to the Pt along with two copies. The chaplain scanned a copy of the Pt. AD into EMR.  This chaplain is available for F/U spiritual care as needed.  Chaplain Stephanie Acre [phone]

## 2022-09-23 NOTE — Progress Notes (Addendum)
  Progress Note    09/23/2022 7:51 AM 6 Days Post-Op  Subjective:  no major complaints this morning. Still with buttock and left foot pain. Legs are very tender    Vitals:   09/22/22 2000 09/23/22 0000  BP: (!) 147/64 134/63  Pulse: (!) 59 63  Resp: 17 14  Temp: 98.1 F (36.7 C) 98.1 F (36.7 C)  SpO2: 97% 100%   Physical Exam: Cardiac:  regular Lungs:  non labored Incisions:  left groin VAC Extremities:  RLE well perfused and warm, Doppler DP/ PT signals, motor and sensation intact. Left foot with ischemic changes to 3rd-5th toes. Motor not intact. Monophasic PT/Pero signals Abdomen:  soft, non distended, some tenderness of LLQ Neurologic: alert and oriented  CBC    Component Value Date/Time   WBC 16.9 (H) 09/23/2022 0317   RBC 3.52 (L) 09/23/2022 0317   HGB 10.6 (L) 09/23/2022 0317   HGB 10.4 (L) 06/18/2021 1431   HCT 31.9 (L) 09/23/2022 0317   HCT 31.6 (L) 06/18/2021 1431   PLT 188 09/23/2022 0317   PLT 142 (L) 06/18/2021 1431   MCV 90.6 09/23/2022 0317   MCV 96 06/18/2021 1431   MCH 30.1 09/23/2022 0317   MCHC 33.2 09/23/2022 0317   RDW 22.4 (H) 09/23/2022 0317   RDW 14.3 06/18/2021 1431   LYMPHSABS 1.5 08/28/2022 1810   LYMPHSABS 1.5 06/18/2021 1431   MONOABS 0.7 08/28/2022 1810   EOSABS 0.1 08/28/2022 1810   EOSABS 1.0 (H) 06/18/2021 1431   BASOSABS 0.1 08/28/2022 1810   BASOSABS 0.1 06/18/2021 1431    BMET    Component Value Date/Time   NA 135 09/23/2022 0317   NA 141 06/18/2021 1431   K 3.9 09/23/2022 0317   CL 97 (L) 09/23/2022 0317   CO2 27 09/23/2022 0317   GLUCOSE 107 (H) 09/23/2022 0317   BUN 36 (H) 09/23/2022 0317   BUN 20 06/18/2021 1431   CREATININE 4.59 (H) 09/23/2022 0317   CREATININE 1.15 02/19/2017 1049   CALCIUM 8.4 (L) 09/23/2022 0317   CALCIUM 8.5 (L) 06/10/2020 0900   GFRNONAA 13 (L) 09/23/2022 0317   GFRAA 6 (L) 03/04/2020 1045    INR    Component Value Date/Time   INR 1.2 08/12/2022 1501     Intake/Output  Summary (Last 24 hours) at 09/23/2022 0751 Last data filed at 09/22/2022 1830 Gross per 24 hour  Intake 612 ml  Output 2550 ml  Net -1938 ml     Assessment/Plan:  67 y.o. male is s/p iliofemoral bypass, removal of infected patch with sartorius muscle flap 6 Days Post-Op   Left leg remains warm, ischemic changes unchanged. Faint PT/Pero signals Left groin wound VAC with good seal. Next change of VAC tomorrow 4/18 Remains on Daptomycin Needs close monitoring and care for sacral decubitus ulcer. Getting Hydrotherapy Encourage po intake H&H stable post transfusion Continue PT/ OT Hopefully transferring to 4E Appreciate CCM, Nephrology, ID assistance  Graceann Congress, [place] [facility] [phone] 09/23/2022 7:51 AM  VASCULAR STAFF ADDENDUM: I have independently interviewed and examined the patient. I agree with the above.  VAC change tomorrow. He is aware I am very concerned about his sacral healing due to his poor pelvic perfusion   Fara Olden, MD [facility] Phone Number: [phone] 09/23/2022 8:57 AM

## 2022-09-23 NOTE — Progress Notes (Signed)
PROGRESS NOTE                                                                                                                                                                                                             Patient Demographics:    Albert Harris, is a 67 y.o. male, DOB - 06/28/1955, ZOX:[id number]  Outpatient Primary MD for the patient is [name], NP    LOS - 26  Admit date - 08/28/2022    Chief Complaint  Patient presents with   Abdominal Pain       Brief Narrative (HPI from H&P)    67 yo male brought to ER with abdominal pain, nausea, diaphoresis and unable to feel his Lt foot. Hypotensive with lactic acidosis in ER. 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 Hx of ESRD and had hyperkalemia. Had emergent CRRT. Vascular consulted and taken to OR underwent iliofemoral bypass on the left leg 08/28/22, some bleeding from the left groin and infection this was debrided and a wound VAC was placed by vascular surgery on 09/09/2022.    3/22 Admit, emergent CRRT, to OR for repair of distal Lt external iliac artery 3/24 Extubated. CT head obtained for altered mental status did not show any acute abnormalities 3/26 Off CRRT and pressors, Vascular surgery consulted for clotted AVG 3/27 AV graft thrombectomy attempted in OR but patient became hemodynamically unstable and procedure was aborted.  Was on pressors briefly 3/28 dialysis followed by CVC and aline removal for line holiday 3/29 MRI brain ordered for LLE weakness and found with right subinsular 4mm infarct. Neuro consulted 3/30 CT A/P increased hematoma in left pelvic region associated with Hg drop 9>7 4/1 hgb stable, TEE, new tunneled line planned 4/3 L groin bleeding. Taken to OR urgently by VVS for iliofemoral bypass, removal of infected patch, sartorius flap.  4/6, 4/7 ongoing rectal bleeding. Transfused both days.  4/6 flex sig  with multiple non-bleeding ulcers.  4/9 colonoscopy - 2 large ulcers in the distal rectum with a visible vessel and Hemoclip placed - few ulcers in the ascending colon, cecum, and at the ileocecal valve - hemorrhoids -biopsies sent  4/8 ongoing rectal bleeding. Transfusing again. Did not drink bowel prep for colonoscopy. Aborted.  PCCM called back.  4/11 two large bloody BM's overnight with Hgb drop  from 7.8 to 6.5 with hypotension, moved back to ICU and transferred  4/11 flex Sig - seen by GI rectal bleeding site was cauterized bleeding was stabilized 09/18/2022 transferred to my care under TRH on day 22 of his hospital stay    Subjective:   In bed, comfortable, denies any complaints, no fever, no chills, he had BM yesterday, but denies any blood noted with it.      Assessment  & Plan :    Peripheral vascular disease, ischemia of the left leg, Lt common femoral artery pseudoaneurysm seen by vascular surgery Dr. Roxan Hockey s/p iliofemoral bypass this admission, complicated by left groin bleeding and left groin infection.   - Left groin infected patch removed has left groin wound VAC.  Received antibiotics.  Infection stable.  Is discussed with vascular surgeon Dr. Roxan Hockey on 09/18/2022 he is not a candidate for any further vascular intervention at all.  Unfortunately has lost all use of left lower extremity at some point it will be amputated.  Currently pain control and medical management only. -Management per vascular surgery, status post iliofemoral bypass, removal of infected patch with sartorius muscle flap 6 days ago postop.  Retroperitoneal hemorrhage, stable -  Initially had increased L flank pain, however clinically resolved no abdominal pain now.   Acute blood loss anemia secondary to hematochezia, Acute lower GI bleeding with multiple rectal bleeding ulcers, also had some retroperitoneal bleed. Underwent colonoscopy along with later flex sig by [facility],  He underwent colonoscopy  which showed multiple rectal ulcers, one of the ulcers showed visible bleeding vessel, clips applied and hemorrhoids, repeated episodes of hematochezia with hypotension overnight prompting return to ICU, Hgb 6.5, received 2 units of packed RBC on 09/16/2022, seen by GI again on 09/17/2022 underwent flex sig rectal ulcer with bleeding sites were cauterized on 09/17/2022 with stabilization of bleeding, per GI ulcers Ischemic vs IBD. -Hemoglobin remains stable, continue to monitor closely and transfuse as needed, continue with supportive care - requiring intermittent PRBCs, 2 units 09/16/22, 1 unit 09/19/22, 2 more 09/22/22 - GI on board. Monitor.    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 Faecium/VRE. [facility] 3/27 negative. Continue daptomycin on dialysis days, Infectious disease following.  Case discussed with Dr. Elinor Parkinson ID 09/18/22 - EOT is 10/23/22 - Dapto with HD. He has a appointement with Dr Thedore Mins on 09/28/22.   ESRD, Hyponatremia, improved  -Nephrology is following, HD per nephrology via RIJ tunneled catheter. TTS    Chronic systolic CHF with EF 35 to 40% on Echo from 06/29/22. Hx of pulmonary hypertension.  Pressure is improved with placed on Coreg, Plavix held due to ongoing GI bleed.    Acute metabolic encephalopathy from uremia, acidosis, Acute 4mm right subinsular stroke -CT head on 3/24 was normal, Mental status has improved, left leg ischemia and loss of function.   Moderate malnutrition - Continue dietary supplements   COPD.  Stable.  Dyslipidemia.  Resumed statin.  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  Outpatient follow-up with pulmonary and GI to be arranged by PCP postdischarge.   Sacral pressure ulcer -continue with hydrotherapy  Pressure Injury 09/04/22 Heel Left Deep Tissue Pressure Injury - Purple or maroon localized area of discolored intact skin or blood-filled blister due to damage of  underlying soft tissue from pressure and/or shear. (Active)  09/04/22 1200  Location: Heel  Location Orientation: Left  Staging: Deep Tissue  Pressure Injury - Purple or maroon localized area of discolored intact skin or blood-filled blister due to damage of underlying soft tissue from pressure and/or shear.  Wound Description (Comments):   Present on Admission:            Condition - Extremely Guarded  Family Communication  :  None present  Code Status :  Full  Consults  :  GI, VVS, ID, PCCM, palliative care  PUD Prophylaxis : PPI   Procedures  :            Disposition Plan  :    Status is: Inpatient  DVT Prophylaxis  :    SCD's Start: 09/09/22 1841   Lab Results  Component Value Date   PLT 188 09/23/2022    Diet :  Diet Order             Diet renal with fluid restriction Fluid restriction: 1200 mL Fluid; Room service appropriate? Yes; Fluid consistency: Thin  Diet effective now                    Inpatient Medications  Scheduled Meds:  (feeding supplement) PROSource Plus  30 mL Oral TID BM   acetaminophen  650 mg Oral Q6H   budesonide  9 mg Oral Daily   calcium acetate  1,334 mg Oral TID WC   carvedilol  3.125 mg Oral BID WC   Chlorhexidine Gluconate Cloth  6 each Topical Q0600   Chlorhexidine Gluconate Cloth  6 each Topical Q0600   darbepoetin (ARANESP) injection - DIALYSIS  200 mcg Subcutaneous Q Wed-1800   feeding supplement (NEPRO CARB STEADY)  237 mL Oral TID BM   leptospermum manuka honey  1 Application Topical Daily   liver oil-zinc oxide   Topical TID   mesalamine  1,000 mg Rectal BID   methocarbamol  500 mg Oral TID   multivitamin  1 tablet Oral QHS   pantoprazole  40 mg Oral Daily   polyethylene glycol  17 g Oral BID   rosuvastatin  10 mg Oral Daily   sodium chloride flush  10-40 mL Intracatheter Q12H   Continuous Infusions:  sodium chloride Stopped (09/17/22 0805)   DAPTOmycin (CUBICIN) 600 mg in sodium chloride 0.9 % IVPB  600 mg (09/22/22 1544)   DAPTOmycin (CUBICIN) 750 mg in sodium chloride 0.9 % IVPB 130 mL/hr at 09/21/22 0456   sodium chloride     PRN Meds:.alteplase, dicyclomine, docusate sodium, heparin, hydrALAZINE, HYDROmorphone (DILAUDID) injection, mouth rinse, oxyCODONE, pentafluoroprop-tetrafluoroeth, sodium chloride, traMADol     Objective:   Vitals:   09/23/22 0600 09/23/22 0700 09/23/22 0800 09/23/22 0900  BP:   (!) 162/75   Pulse: 65 60 66 65  Resp: Temp:   97.8 F (36.6 C)   TempSrc:   Oral   SpO2: 100% 98% 100% 99%  Weight:      Height:        Wt Readings from Last 3 Encounters:  09/22/22 67.6 kg  08/23/22 64.1 kg  08/15/22 64.9 kg     Intake/Output Summary (Last 24 hours) at 09/23/2022 1541 Last data filed at 09/22/2022 1830 Gross per 24 hour  Intake --  Output 50 ml  Net -50 ml     Physical Exam  Awake Alert, No new F.N deficits, Normal affect [place].AT,PERRAL Supple Neck, No JVD,   Symmetrical Chest wall movement, Good air movement bilaterally, CTAB RRR,No Gallops,Rubs or new Murmurs,  +ve B.Sounds, Abd Soft,  No tenderness,   Left groin wound VAC in place, left lower extremity extremely weak,    RN pressure injury documentation: Pressure Injury 08/28/22 Sacrum Medial Unstageable - Full thickness tissue loss in which the base of the injury is covered by slough (yellow, tan, gray, green or brown) and/or eschar (tan, brown or black) in the wound bed. Sacral wound with eschar coveri (Active)  08/28/22 2228  Location: Sacrum  Location Orientation: Medial  Staging: Unstageable - Full thickness tissue loss in which the base of the injury is covered by slough (yellow, tan, gray, green or brown) and/or eschar (tan, brown or black) in the wound bed.  Wound Description (Comments): Sacral wound with eschar covering wound bed  Present on Admission: Yes     Pressure Injury 09/04/22 Heel Left Deep Tissue Pressure Injury - Purple or maroon localized area of  discolored intact skin or blood-filled blister due to damage of underlying soft tissue from pressure and/or shear. (Active)  09/04/22 1200  Location: Heel  Location Orientation: Left  Staging: Deep Tissue Pressure Injury - Purple or maroon localized area of discolored intact skin or blood-filled blister due to damage of underlying soft tissue from pressure and/or shear.  Wound Description (Comments):   Present on Admission:   Dressing Type Foam - Lift dressing to assess site every shift 09/22/22 2000      Data Review:    Recent Labs  Lab 09/21/22 1401 09/21/22 1934 09/22/22 0433 09/22/22 2038 09/23/22 0317  WBC 16.5* 20.0* 21.9* 20.7* 16.9*  HGB 9.4* 9.0* 7.0* 11.2* 10.6*  HCT 29.4* 28.1* 21.8* 34.9* 31.9*  PLT 233 223 263 186 188  MCV 97.7 98.6 97.8 92.6 90.6  MCH 31.2 31.6 31.4 29.7 30.1  MCHC 32.0 32.0 32.1 32.1 33.2  RDW 20.7* 20.6* 20.3* 22.5* 22.4*    Recent Labs  Lab 09/19/22 0745 09/20/22 0344 09/21/22 0437 09/22/22 0331 09/23/22 0317  NA 136 135 136 135 135  K 3.4* 3.8 3.8 5.0 3.9  CL 99 98 99 98 97*  CO2 ANIONGAP GLUCOSE 97 93 85 110* 107*  BUN 38* 23 35* 46* 36*  CREATININE 5.68* 3.63* 5.00* 6.12* 4.59*  ALBUMIN 1.6* 1.7* 1.6* 1.7* 1.7*  CALCIUM 8.2* 7.9* 8.5* 9.0 8.4*      Recent Labs  Lab 09/19/22 0745 09/20/22 0344 09/21/22 0437 09/22/22 0331 09/23/22 0317  CALCIUM 8.2* 7.9* 8.5* 9.0 8.4*   Lab Results  Component Value Date   CHOL 94 09/05/2022   HDL 29 (L) 09/05/2022   LDLCALC 45 09/05/2022   TRIG 102 09/05/2022   CHOLHDL 3.2 09/05/2022    Lab Results  Component Value Date   HGBA1C 5.1 09/05/2022    Radiology Reports No results found.    Signature  -   Huey Bienenstock M.D on 09/23/2022 at 3:41 PM   -  To page go to [email or web address]

## 2022-09-23 NOTE — Progress Notes (Signed)
[facility] Progress Note   Subjective:  Seen in room - same complaints, sacral and L leg pain. No CP/dyspnea. Reviewing chart - biopsy from his colon showed possible Crohn's disease - was started on mesalamine for this. Next HD is tomorrow.  Objective Vitals:   09/23/22 0600 09/23/22 0700 09/23/22 0800 09/23/22 0900  BP:   (!) 162/75   Pulse: 65 60 66 65  Resp: Temp:   97.8 F (36.6 C)   TempSrc:   Oral   SpO2: 100% 98% 100% 99%  Weight:      Height:       Physical Exam General: Well appearing man, NAD. Heart: RRR; no murmur Lungs: CTA anteriorly Abdomen: soft Extremities: no LE edema; L foot unwrapped at this time - ischemic/black tips of distal 3rd-5th toes. Dialysis Access: [facility]  Additional Objective Labs: Basic Metabolic Panel: Recent Labs  Lab 09/21/22 0437 09/22/22 0331 09/23/22 0317  NA 136 135 135  K 3.8 5.0 3.9  CL 99 98 97*  CO2 GLUCOSE 85 110* 107*  BUN 35* 46* 36*  CREATININE 5.00* 6.12* 4.59*  CALCIUM 8.5* 9.0 8.4*  PHOS 2.6 3.3 3.9   Liver Function Tests: Recent Labs  Lab 09/21/22 0437 09/22/22 0331 09/23/22 0317  ALBUMIN 1.6* 1.7* 1.7*   CBC: Recent Labs  Lab 09/21/22 1401 09/21/22 1934 09/22/22 0433 09/22/22 2038 09/23/22 0317  WBC 16.5* 20.0* 21.9* 20.7* 16.9*  HGB 9.4* 9.0* 7.0* 11.2* 10.6*  HCT 29.4* 28.1* 21.8* 34.9* 31.9*  MCV 97.7 98.6 97.8 92.6 90.6  PLT 233 223 263 186 188   Blood Culture    Component Value Date/Time   SDES TISSUE 09/09/2022 1453   SPECREQUEST VESSEL PT ON VANC ANCEF 09/09/2022 1453   CULT  09/09/2022 1453    RARE STAPHYLOCOCCUS AUREUS SUSCEPTIBILITIES PERFORMED ON PREVIOUS CULTURE WITHIN THE LAST 5 DAYS. NO ANAEROBES ISOLATED Performed at [facility], [address], [place], [place] [address]    REPTSTATUS 09/14/2022 FINAL 09/09/2022 1453    Medications:  sodium chloride Stopped (09/17/22 0805)   DAPTOmycin (CUBICIN) 600 mg in sodium chloride 0.9  % IVPB 600 mg (09/22/22 1544)   DAPTOmycin (CUBICIN) 750 mg in sodium chloride 0.9 % IVPB 130 mL/hr at 09/21/22 0456   sodium chloride      (feeding supplement) PROSource Plus  30 mL Oral TID BM   acetaminophen  650 mg Oral Q6H   budesonide  9 mg Oral Daily   calcium acetate  1,334 mg Oral TID WC   carvedilol  3.125 mg Oral BID WC   Chlorhexidine Gluconate Cloth  6 each Topical Q0600   Chlorhexidine Gluconate Cloth  6 each Topical Q0600   darbepoetin (ARANESP) injection - DIALYSIS  200 mcg Subcutaneous Q Wed-1800   feeding supplement (NEPRO CARB STEADY)  237 mL Oral TID BM   leptospermum manuka honey  1 Application Topical Daily   liver oil-zinc oxide   Topical TID   mesalamine  1,000 mg Rectal BID   methocarbamol  500 mg Oral TID   multivitamin  1 tablet Oral QHS   pantoprazole  40 mg Oral Daily   polyethylene glycol  17 g Oral BID   rosuvastatin  10 mg Oral Daily   sodium chloride flush  10-40 mL Intracatheter Q12H    Dialysis Orders: TTS SW  4h   350/800  64.5kg  RFA AVG -> clotted/ new TDC in place Heparin none  -  last OP HD 3/14, post wt 65.9kg - venofer  IV weekly - rocaltrol 0.25 mcg po tiw - phoslo 2 ac tid - no esa, last Hb 11.0   Assessment/Plan: Left common femoral artery pseudoaneurysm - s/p L femoral exploration with pseudoaneurysm repair 08/29/22, then iliofemoral bypass, removal of infected patch with sartorius muscle flap on 09/09/22. Ischemic changes to L 3rd-5th toes - still high risk of limb loss. Plan pending at this time.   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  CVA: + stroke on MRI. Neuro signed off.  ESRD: Continue HD on TTS schedule, did require short-term CRRT 3/23- 3/26. HD tomorrow. BP/volume: Variable, no edema on exam. Anemia (ESRD + ABLA): Last transfused 2U on 4/16 (that brings total to 19U this admit). Also on Aranesp q Wed (max  dose). Hgb10.6 today. Lower GIB: S/p colonoscopy 09/15/22, then repeat sigmoidoscopy 4/11. Several bleeding ulcers addressed with endoscopy.  Path came back + Crohn's - started on mesalamine by GI. No heparin with HD.  Secondary HPTH: CorrCa high, Phos ok. Holding VDRA for now. Will work to get him off  Phoslo as outpatient - can continue for now. Dialysis access: AVG clotted 3/27, went for declot by VVS and then re-clotted. VVS placed R TDC on 4/1.  HFrEF (EF 35-40%)  Ozzie Hoyle, PA-C 09/23/2022, 11:00 AM  [facility]

## 2022-09-23 NOTE — Progress Notes (Signed)
Palliative Medicine Progress Note   Patient Name: Albert Harris       Date: 09/23/2022 DOB: 1956/04/15  Age: 67 y.o. MRN#: [id number] Attending Physician: Starleen Arms, MD Primary Care Physician: [name], NP Admit Date: 08/28/2022    HPI/Patient Profile: 67 y.o. male  with past medical history of ESRD on HD, peripheral vascular disease, chronic systolic CHF with EF 35 to 40%, and COPD.  He presented to [facility] on 08/28/2022 with abdominal pain, nausea, and unable to feel his left foot.  He was found to have a left femoral pseudoaneurysm and was taken to the OR for repair of his distal left external iliac artery.  He required emergent CRRT due to hypotension and hyperkalemia.  Hospitalization has been complicated by MRSA bacteremia secondary to post-op wound infection and recurrent rectal bleeding requiring multiple transfusions.    Palliative Medicine was consulted for goals of care in the setting of ESRD, severe vascular disease, and bleeding.   Subjective: Chart reviewed and patient assessed at bedside. He continues to report pain in buttocks and left foot. Daughter/Albert Harris is at bedside.   Education provided on HCPOA document as a legal document in which a person names another person as their "healthcare agent" to make medical decisions for them if they are not able to make those decisions for himself.  Discussed that a healthcare agent should be someone that is available to represent the person when needed and is willing to honor their wishes.  Patient verbalizes understanding and states that he would want daughter/Albert Harris as his healthcare agent, with his spouse/Albert Harris as the alternate.  I then prepared the HCPOA document accordingly.  I also encouraged patient to discuss with Albert Harris  about his goals and preferences for his medical care.    I communicated with chaplain Alvino Chapel to assist with having the document notarized and witnessed.   Objective:  Physical Exam Constitutional:      General: He is not in acute distress.    Appearance: He is ill-appearing.  Pulmonary:     Effort: Pulmonary effort is normal.  Neurological:     Mental Status: He is alert and oriented to person, place, and time.  Psychiatric:        Behavior: Behavior normal.  Palliative Medicine Assessment & Plan   Assessment: Principal Problem:   Septic shock Active Problems:   Lower GI bleed   Metabolic encephalopathy   Lactic acidosis   ESRD (end stage renal disease) on dialysis   Malnutrition of moderate degree   Pseudoaneurysm of femoral artery   Decreased functional mobility   MRSA (methicillin resistant Staphylococcus aureus) septicemia   Pressure injury of skin   Colonic ulcer   Rectal arterial hemorrhage   Rectal ulcer    Recommendations/Plan: Continue full scope care Goal of care - medical stabilization and improvement Appreciate spiritual care assistance with advanced directives Ongoing palliative support   Code Status: Full code   Prognosis:  Unable to determine     Thank you for allowing the Palliative Medicine Team to assist in the care of this patient.   MDM - High   Albert Proud, NP   Please contact Palliative Medicine Team phone at [phone] for questions and concerns.  For individual providers, please see AMION.

## 2022-09-23 NOTE — Progress Notes (Signed)
Pharmacy Antibiotic Note  Albert Harris is a 67 y.o. 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 Now with MRSA bacteremia. Noted patient is ESRD HD TTS PTA, with a right arm AV graft and a catheter in his right IJ. Pharmacy consulted to dose daptomycin due to enterococcus faecium and staph aureus growing from tissue cultures from 4/3.  Planning to keep on TTS schedule for HD- will schedule Daptomycin with HD. 4/16 CK 26  Plan: - Daptomycin  (8 mg/kg) IV post HD on Tues/Thurs and 750 mg (10 m/gk) IV post HD on Saturdays - Will continue to monitor CK, follow HD schedule  Height:  (172.7 cm) Weight:  (unable to obtain, RN aware) IBW/kg (Calculated) : 68.4  Temp (24hrs), Avg:98 F (36.7 C), Min:97.8 F (36.6 C), Max:98.1 F (36.7 C)  Recent Labs  Lab 09/19/22 0745 09/19/22 1501 09/20/22 0344 09/21/22 0437 09/21/22 1401 09/21/22 1934 09/22/22 0331 09/22/22 0433 09/22/22 2038 09/23/22 0317  WBC 12.4*   < > 14.9* 14.7* 16.5* 20.0*  --  21.9* 20.7* 16.9*  CREATININE 5.68*  --  3.63* 5.00*  --   --  6.12*  --   --  4.59*   < > = values in this interval not displayed.     Estimated Creatinine Clearance: 14.7 mL/min (A) (by C-G formula based on SCr of 4.59 mg/dL (H)).    Allergies  Allergen Reactions   Benadryl [Diphenhydramine] Rash   Lidocaine-Prilocaine Rash    Caused red marks up and down arm   Antimicrobials this admission:  Cefazolin 3/23 x 1 Vancomycin 3/24 >> 4/5 Zosyn 4/3 >>4/3 Daptomycin 4/5>>5/17  Microbiology results:  4/3 L groin wound: MSRA + VRE 4/3 MRSA PCR: neg 3/27 Bcx: neg 3/25 Bcx: MRSA 3/23 Bcx: MRSA   Laronn Devonshire Arlester Marker, PharmD, BCIDP, AAHIVP, CPP Infectious Disease Pharmacist 09/23/2022 2:28 PM

## 2022-09-23 NOTE — Progress Notes (Signed)
PT Cancellation Note  Patient Details Name: Albert Harris MRN: [id number] DOB: 04-10-1956   Cancelled Treatment:    Reason Eval/Treat Not Completed: Patient declined, no reason specified. Attempted to see pt this afternoon but pt declining at this time, reporting not feeling up to mobility and requesting pain medicine, RN notified. Acute PT will continue to follow pt as appropriate/available. Thank you.    Leonie Man 09/23/2022, 2:39 PM

## 2022-09-23 NOTE — Progress Notes (Signed)
Received a call from bedside RN regarding the patient having profuse rectal bleeding.  Presented at bedside.  Significant amount of rectal bleed noted on exam.  Pain less.  1 unit of PRBCs initially ordered but due to persistent bleed, additional 2 units were ordered to be transfused.  H&H every 6 hours.  [facility], Dr. Myrtie Neither, paged for reconsult.

## 2022-09-24 ENCOUNTER — Encounter (HOSPITAL_COMMUNITY): Payer: Self-pay | Admitting: Pulmonary Disease

## 2022-09-24 ENCOUNTER — Encounter (HOSPITAL_COMMUNITY)
Admission: EM | Disposition: A | Discharge status: Hospice | Payer: Self-pay | Source: Home / Self Care | Attending: Internal Medicine

## 2022-09-24 ENCOUNTER — Encounter (HOSPITAL_COMMUNITY): Payer: Self-pay | Admitting: Anesthesiology

## 2022-09-24 ENCOUNTER — Inpatient Hospital Stay (HOSPITAL_COMMUNITY): Payer: Medicare Other

## 2022-09-24 DIAGNOSIS — K921 Melena: Secondary | ICD-10-CM

## 2022-09-24 DIAGNOSIS — A419 Sepsis, unspecified organism: Secondary | ICD-10-CM | POA: Diagnosis not present

## 2022-09-24 DIAGNOSIS — D62 Acute posthemorrhagic anemia: Secondary | ICD-10-CM

## 2022-09-24 DIAGNOSIS — K626 Ulcer of anus and rectum: Secondary | ICD-10-CM | POA: Diagnosis not present

## 2022-09-24 DIAGNOSIS — R6521 Severe sepsis with septic shock: Secondary | ICD-10-CM | POA: Diagnosis not present

## 2022-09-24 DIAGNOSIS — N186 End stage renal disease: Secondary | ICD-10-CM | POA: Diagnosis not present

## 2022-09-24 DIAGNOSIS — K625 Hemorrhage of anus and rectum: Secondary | ICD-10-CM | POA: Diagnosis not present

## 2022-09-24 DIAGNOSIS — Z992 Dependence on renal dialysis: Secondary | ICD-10-CM | POA: Diagnosis not present

## 2022-09-24 DIAGNOSIS — K5730 Diverticulosis of large intestine without perforation or abscess without bleeding: Secondary | ICD-10-CM | POA: Diagnosis not present

## 2022-09-24 HISTORY — PX: HEMOSTASIS CONTROL: SHX6838

## 2022-09-24 HISTORY — PX: FLEXIBLE SIGMOIDOSCOPY: SHX5431

## 2022-09-24 LAB — COMPREHENSIVE METABOLIC PANEL
ALT: 11 U/L (ref 0–44)
AST: 19 U/L (ref 15–41)
Albumin: 1.7 g/dL — ABNORMAL LOW (ref 3.5–5.0)
Alkaline Phosphatase: 92 U/L (ref 38–126)
Anion gap: 14 (ref 5–15)
BUN: 54 mg/dL — ABNORMAL HIGH (ref 8–23)
CO2: 25 mmol/L (ref 22–32)
Calcium: 7.9 mg/dL — ABNORMAL LOW (ref 8.9–10.3)
Chloride: 97 mmol/L — ABNORMAL LOW (ref 98–111)
Creatinine, Ser: 5.69 mg/dL — ABNORMAL HIGH (ref 0.61–1.24)
GFR, Estimated: 10 mL/min — ABNORMAL LOW (ref >60)
Glucose, Bld: 121 mg/dL — ABNORMAL HIGH (ref 70–99)
Potassium: 4.3 mmol/L (ref 3.5–5.1)
Sodium: 136 mmol/L (ref 135–145)
Total Bilirubin: 1.6 mg/dL — ABNORMAL HIGH (ref 0.3–1.2)
Total Protein: 4.5 g/dL — ABNORMAL LOW (ref 6.5–8.1)

## 2022-09-24 LAB — BPAM RBC
Blood Product Expiration Date: 202405102359
Blood Product Expiration Date: 202405142359
Blood Product Expiration Date: 202405162359
ISSUE DATE / TIME: 202404180012
Unit Type and Rh: 5100
Unit Type and Rh: 5100

## 2022-09-24 LAB — TYPE AND SCREEN
Unit division: 0
Unit division: 0
Unit division: 0

## 2022-09-24 LAB — POCT I-STAT 7, (LYTES, BLD GAS, ICA,H+H)
Acid-Base Excess: 1 mmol/L (ref 0.0–2.0)
Bicarbonate: 25.5 mmol/L (ref 20.0–28.0)
Calcium, Ion: 1.12 mmol/L — ABNORMAL LOW (ref 1.15–1.40)
HCT: 33 % — ABNORMAL LOW (ref 39.0–52.0)
Hemoglobin: 11.2 g/dL — ABNORMAL LOW (ref 13.0–17.0)
O2 Saturation: 100 %
Patient temperature: 97.5
Potassium: 4 mmol/L (ref 3.5–5.1)
Sodium: 135 mmol/L (ref 135–145)
TCO2: 27 mmol/L (ref 22–32)
pCO2 arterial: 37.7 mmHg (ref 32–48)
pH, Arterial: 7.436 (ref 7.35–7.45)
pO2, Arterial: 170 mmHg — ABNORMAL HIGH (ref 83–108)

## 2022-09-24 LAB — BPAM PLATELET PHERESIS: Blood Product Expiration Date: 202404192359

## 2022-09-24 LAB — GLUCOSE, CAPILLARY
Glucose-Capillary: 113 mg/dL — ABNORMAL HIGH (ref 70–99)
Glucose-Capillary: 114 mg/dL — ABNORMAL HIGH (ref 70–99)
Glucose-Capillary: 82 mg/dL (ref 70–99)
Glucose-Capillary: 85 mg/dL (ref 70–99)
Glucose-Capillary: 86 mg/dL (ref 70–99)
Glucose-Capillary: 86 mg/dL (ref 70–99)
Glucose-Capillary: 87 mg/dL (ref 70–99)

## 2022-09-24 LAB — PREPARE FRESH FROZEN PLASMA

## 2022-09-24 LAB — PREPARE CRYOPRECIPITATE: Unit division: 0

## 2022-09-24 LAB — CBC
HCT: 34.2 % — ABNORMAL LOW (ref 39.0–52.0)
Hemoglobin: 11.6 g/dL — ABNORMAL LOW (ref 13.0–17.0)
MCH: 30.5 pg (ref 26.0–34.0)
MCHC: 33.9 g/dL (ref 30.0–36.0)
MCV: 90 fL (ref 80.0–100.0)
Platelets: 179 10*3/uL (ref 150–400)
RBC: 3.8 MIL/uL — ABNORMAL LOW (ref 4.22–5.81)
RDW: 18 % — ABNORMAL HIGH (ref 11.5–15.5)
WBC: 21 10*3/uL — ABNORMAL HIGH (ref 4.0–10.5)
nRBC: 0 % (ref 0.0–0.2)

## 2022-09-24 LAB — PREPARE PLATELET PHERESIS: Unit division: 0

## 2022-09-24 LAB — PHOSPHORUS: Phosphorus: 5.7 mg/dL — ABNORMAL HIGH (ref 2.5–4.6)

## 2022-09-24 LAB — MAGNESIUM: Magnesium: 1.9 mg/dL (ref 1.7–2.4)

## 2022-09-24 LAB — BPAM FFP
Blood Product Expiration Date: 202404212359
Blood Product Expiration Date: 202404212359

## 2022-09-24 LAB — BPAM CRYOPRECIPITATE
Blood Product Expiration Date: 202404182359
Blood Product Expiration Date: 202404192359
ISSUE DATE / TIME: 202404180100
ISSUE DATE / TIME: 202404180100
Unit Type and Rh: 5100

## 2022-09-24 LAB — PREPARE RBC (CROSSMATCH)

## 2022-09-24 SURGERY — SIGMOIDOSCOPY, FLEXIBLE
Anesthesia: General

## 2022-09-24 MED ORDER — HYDROMORPHONE HCL 1 MG/ML IJ SOLN
0.5000 mg | INTRAMUSCULAR | Status: DC | PRN
  Administered 2022-09-24 – 2022-09-28 (×9): 0.5 mg via INTRAVENOUS
  Filled 2022-09-24 (×10): qty 0.5

## 2022-09-24 MED ORDER — FENTANYL 2500MCG IN NS 250ML (10MCG/ML) PREMIX INFUSION
INTRAVENOUS | Status: AC
  Administered 2022-09-24: 25 ug/h via INTRAVENOUS
  Filled 2022-09-24: qty 250

## 2022-09-24 MED ORDER — CALCIUM GLUCONATE-NACL 2-0.675 GM/100ML-% IV SOLN
2.0000 g | Freq: Once | INTRAVENOUS | Status: AC
  Administered 2022-09-24: 2000 mg via INTRAVENOUS
  Filled 2022-09-24: qty 100

## 2022-09-24 MED ORDER — RENA-VITE PO TABS: 1.0000 | ORAL_TABLET | Freq: Every day | ORAL | Status: DC

## 2022-09-24 MED ORDER — SODIUM CHLORIDE 0.9 % IV SOLN
10.0000 mg/kg | INTRAVENOUS | Status: DC
  Administered 2022-10-03 – 2022-10-17 (×3): 750 mg via INTRAVENOUS
  Filled 2022-09-24 (×4): qty 15

## 2022-09-24 MED ORDER — FENTANYL CITRATE PF 50 MCG/ML IJ SOSY
PREFILLED_SYRINGE | INTRAMUSCULAR | Status: AC
  Administered 2022-09-24: 50 ug via INTRAVENOUS
  Filled 2022-09-24: qty 2

## 2022-09-24 MED ORDER — DAKINS (1/4 STRENGTH) 0.125 % EX SOLN
Freq: Two times a day (BID) | CUTANEOUS | Status: AC
  Administered 2022-09-30: 1
  Filled 2022-09-24 (×3): qty 473

## 2022-09-24 MED ORDER — SODIUM CHLORIDE 0.9% IV SOLUTION: Freq: Once | INTRAVENOUS | Status: AC

## 2022-09-24 MED ORDER — FENTANYL CITRATE PF 50 MCG/ML IJ SOSY: 25.0000 ug | PREFILLED_SYRINGE | Freq: Once | INTRAMUSCULAR | Status: AC

## 2022-09-24 MED ORDER — MIDAZOLAM HCL 2 MG/2ML IJ SOLN: 1.0000 mg | INTRAMUSCULAR | Status: DC | PRN

## 2022-09-24 MED ORDER — PROPOFOL 1000 MG/100ML IV EMUL
INTRAVENOUS | Status: AC
  Administered 2022-09-24: 20 ug/kg/min via INTRAVENOUS
  Filled 2022-09-24: qty 100

## 2022-09-24 MED ORDER — HEPARIN SODIUM (PORCINE) 1000 UNIT/ML IJ SOLN
INTRAMUSCULAR | Status: AC
  Administered 2022-09-24: 1000 [IU] via INTRAVENOUS_CENTRAL
  Filled 2022-09-24: qty 4

## 2022-09-24 MED ORDER — MIDAZOLAM HCL 2 MG/2ML IJ SOLN
INTRAMUSCULAR | Status: AC
  Administered 2022-09-24: 2 mg via INTRAVENOUS
  Filled 2022-09-24: qty 2

## 2022-09-24 MED ORDER — DOCUSATE SODIUM 50 MG/5ML PO LIQD: 100.0000 mg | Freq: Two times a day (BID) | ORAL | Status: DC

## 2022-09-24 MED ORDER — PROPOFOL 1000 MG/100ML IV EMUL
0.0000 ug/kg/min | INTRAVENOUS | Status: DC
  Administered 2022-09-24: 20 ug/kg/min via INTRAVENOUS
  Filled 2022-09-24: qty 100

## 2022-09-24 MED ORDER — PANTOPRAZOLE SODIUM 40 MG IV SOLR
40.0000 mg | Freq: Two times a day (BID) | INTRAVENOUS | Status: DC
  Administered 2022-09-24 – 2022-09-25 (×4): 40 mg via INTRAVENOUS
  Filled 2022-09-24 (×4): qty 10

## 2022-09-24 MED ORDER — CHLORHEXIDINE GLUCONATE 0.12 % MT SOLN: 15.0000 mL | Freq: Once | OROMUCOSAL | Status: DC

## 2022-09-24 MED ORDER — ETOMIDATE 2 MG/ML IV SOLN: 20.0000 mg | Freq: Once | INTRAVENOUS | Status: AC

## 2022-09-24 MED ORDER — POLYETHYLENE GLYCOL 3350 17 G PO PACK: 17.0000 g | PACK | Freq: Every day | ORAL | Status: DC

## 2022-09-24 MED ORDER — ROCURONIUM BROMIDE 10 MG/ML (PF) SYRINGE: 50.0000 mg | PREFILLED_SYRINGE | Freq: Once | INTRAVENOUS | Status: AC

## 2022-09-24 MED ORDER — FENTANYL 2500MCG IN NS 250ML (10MCG/ML) PREMIX INFUSION: 25.0000 ug/h | INTRAVENOUS | Status: DC

## 2022-09-24 MED ORDER — ROSUVASTATIN CALCIUM 5 MG PO TABS: 10.0000 mg | ORAL_TABLET | Freq: Every day | ORAL | Status: DC

## 2022-09-24 MED ORDER — ETOMIDATE 2 MG/ML IV SOLN
INTRAVENOUS | Status: AC
  Administered 2022-09-24: 20 mg via INTRAVENOUS
  Filled 2022-09-24: qty 20

## 2022-09-24 MED ORDER — FAMOTIDINE 20 MG PO TABS: 20.0000 mg | ORAL_TABLET | Freq: Two times a day (BID) | ORAL | Status: DC

## 2022-09-24 MED ORDER — ORAL CARE MOUTH RINSE
15.0000 mL | OROMUCOSAL | Status: DC
  Administered 2022-09-24 (×4): 15 mL via OROMUCOSAL

## 2022-09-24 MED ORDER — FENTANYL BOLUS VIA INFUSION
25.0000 ug | INTRAVENOUS | Status: DC | PRN
  Administered 2022-09-24: 100 ug via INTRAVENOUS

## 2022-09-24 MED ORDER — ORAL CARE MOUTH RINSE
15.0000 mL | OROMUCOSAL | Status: DC
  Administered 2022-09-24 – 2022-09-25 (×9): 15 mL via OROMUCOSAL

## 2022-09-24 MED ORDER — ORAL CARE MOUTH RINSE: 15.0000 mL | OROMUCOSAL | Status: DC | PRN

## 2022-09-24 MED ORDER — PHENYLEPHRINE 80 MCG/ML (10ML) SYRINGE FOR IV PUSH (FOR BLOOD PRESSURE SUPPORT)
PREFILLED_SYRINGE | INTRAVENOUS | Status: AC
  Filled 2022-09-24: qty 10

## 2022-09-24 MED ORDER — ROCURONIUM BROMIDE 10 MG/ML (PF) SYRINGE
PREFILLED_SYRINGE | INTRAVENOUS | Status: AC
  Administered 2022-09-24: 50 mg via INTRAVENOUS
  Filled 2022-09-24: qty 10

## 2022-09-24 MED ORDER — MIDAZOLAM HCL 2 MG/2ML IJ SOLN: 2.0000 mg | Freq: Once | INTRAMUSCULAR | Status: AC

## 2022-09-24 MED ORDER — COLLAGENASE 250 UNIT/GM EX OINT
TOPICAL_OINTMENT | Freq: Every day | CUTANEOUS | Status: DC
  Administered 2022-10-07 – 2022-10-20 (×2): 1 via TOPICAL
  Filled 2022-09-24 (×6): qty 30

## 2022-09-24 MED ORDER — FENTANYL CITRATE PF 50 MCG/ML IJ SOSY: 50.0000 ug | PREFILLED_SYRINGE | Freq: Once | INTRAMUSCULAR | Status: AC

## 2022-09-24 MED ORDER — CHLORHEXIDINE GLUCONATE CLOTH 2 % EX PADS
6.0000 | MEDICATED_PAD | Freq: Every day | CUTANEOUS | Status: DC
  Administered 2022-09-24 – 2022-10-07 (×11): 6 via TOPICAL

## 2022-09-24 MED ORDER — SODIUM CHLORIDE 0.9 % IV SOLN
8.0000 mg/kg | INTRAVENOUS | Status: DC
  Administered 2022-09-24 – 2022-10-20 (×8): 600 mg via INTRAVENOUS
  Filled 2022-09-24 (×9): qty 12

## 2022-09-24 MED ORDER — TRANEXAMIC ACID-NACL 1000-0.7 MG/100ML-% IV SOLN
1000.0000 mg | Freq: Once | INTRAVENOUS | Status: AC
  Administered 2022-09-24: 1000 mg via INTRAVENOUS
  Filled 2022-09-24: qty 100

## 2022-09-24 NOTE — Anesthesia Preprocedure Evaluation (Deleted)
Anesthesia Evaluation    Reviewed: Allergy & Precautions, NPO status , Patient's Chart, lab work & pertinent test results, reviewed documented beta blocker date and time   History of Anesthesia Complications Negative for: history of anesthetic complications  Airway        Dental  (+) Edentulous Upper, Missing,    Pulmonary asthma , COPD,  COPD inhaler, former smoker          Cardiovascular hypertension, Pt. on medications and Pt. on home beta blockers + Peripheral Vascular Disease (on Plavix) and +CHF    TTE 09/07/22: EF 50-55%, severe LVH, moderate LAE, moderate RAE/LAE, mild MR, mild AS   Neuro/Psych CVA  negative psych ROS   GI/Hepatic Neg liver ROS, PUD,GERD  ,,  Endo/Other  negative endocrine ROS    Renal/GU ESRF and DialysisRenal disease (last HD 09/15/22)  negative genitourinary   Musculoskeletal negative musculoskeletal ROS (+)    Abdominal   Peds  Hematology  (+) Blood dyscrasia (Hgb 9.5), anemia   Anesthesia Other Findings Day of surgery medications reviewed with patient.  Reproductive/Obstetrics negative OB ROS                             Anesthesia Physical Anesthesia Plan  ASA: 3 and emergent  Anesthesia Plan: General   Post-op Pain Management: Minimal or no pain anticipated   Induction: Rapid sequence and Cricoid pressure planned  PONV Risk Score and Plan: 2 and Treatment may vary due to age or medical condition, Ondansetron and Dexamethasone  Airway Management Planned: Oral ETT  Additional Equipment: None  Intra-op Plan:   Post-operative Plan: Extubation in OR  Informed Consent:   Plan Discussed with: CRNA, Anesthesiologist and Surgeon  Anesthesia Plan Comments: (This procedure cannot wait for NPO status per Dr. Myrtie Neither.  He has declared it an emergency and must start ASAP.)        Anesthesia Quick Evaluation

## 2022-09-24 NOTE — Progress Notes (Addendum)
Received patient in bed  Alert and oriented.  Informed consent signed and in chart.   TX duration:3.5h. Pt unable to UF 2 L as ordered.d/t hypotension.  Patient tolerated well.without acute distress.  Hand-off given to patient's nurse.   Access used: catheter Access issues: line  reversed. BFR 350.  Total UF removed: 1 L Medication(s) given: none. Post HD VS: 132/75 P 66 R 14 O2 sat 100 in room air. Post HD weight: 66.5 Kg   [facility]

## 2022-09-24 NOTE — Progress Notes (Signed)
NAME:  Albert Harris, MRN:  [id number], DOB:  11/19/1955, LOS: 27 ADMISSION DATE:  08/28/2022, CONSULTATION DATE:  08/28/22 REFERRING MD: Constance Goltz , CHIEF COMPLAINT:  Abdominal pain  History of Present Illness:  67 yo male brought to ER with abdominal pain, nausea, diaphoresis and unable to feel his Lt foot.  Hypotensive with lactic acidosis in ER.  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  Hx of ESRD and had hyperkalemia.  Had emergent CRRT.  Vascular consulted and taken to OR.    Pertinent  Medical History  ESRD -AV fistula (no flow tonight), PAD, COPD, HLD, HTN, HFrEF 35-40%, PAD  Significant Hospital Events: Including procedures, antibiotic start and stop dates in addition to other pertinent events   3/22 Admit, emergent CRRT, to OR for repair of distal Lt external iliac artery 3/24 Extubated. CT head obtained for altered mental status did not show any acute abnormalities 3/26 Off CRRT and pressors, Vascular surgery consulted for clotted AVG 3/27 AV graft thrombectomy attempted in OR but patient became hemodynamically unstable and procedure was aborted.  Was on pressors briefly 3/28 dialysis followed by CVC and aline removal for line holiday 3/29 MRI brain ordered for LLE weakness and found with right subinsular 4mm infarct. Neuro consulted 3/30 CT A/P increased hematoma in left pelvic region associated with Hg drop 9>7 4/1 hgb stable, TEE, new tunneled line planned 4/3 L groin bleeding. Taken to OR urgently by [facility] for iliofemoral bypass, removal of infected patch, sartorius flap.  4/6, 4/7 ongoing rectal bleeding. Transfused both days.  4/6 flex sig with multiple non-bleeding ulcers.  4/8 ongoing rectal bleeding. Transfusing again. Did not drink bowel prep for colonoscopy. Aborted.  PCCM called back.  4/11 two large bloody BM's overnight with Hgb drop from 7.8 to 6.5 with hypotension, moved back to ICU and transferred  4/18 called to the floor for massive  hemoptysis, intubated, bedside flex sig with bleeding ulcer, hemostatic gel applied  Interim History / Subjective:   Patient with massive amounts of bleeding from the rectum and hypotensive. Bedside flex sig overnight with bleeding ulcer, hemostatic gel applied. S/p multiple units of PRBC, platelets, and FFP.  Objective   Blood pressure 127/71, pulse (!) 53, temperature (!) 97.5 F (36.4 C), temperature source Axillary, resp. rate 18, height  (1.727 m), weight 67.6 kg, SpO2 100 %.    Vent Mode: PRVC FiO2 (%):  [35 %-100 %] 35 % Set Rate:  [16 bmp] 16 bmp Vt Set:  [540 mL] 540 mL PEEP:  [5 cmH20] 5 cmH20 Plateau Pressure:  [13 cmH20-16 cmH20] 16 cmH20   Intake/Output Summary (Last 24 hours) at 09/24/2022 0814 Last data filed at 09/24/2022 4098 Gross per 24 hour  Intake 4351.23 ml  Output --  Net 4351.23 ml   Filed Weights   09/22/22 0500 09/24/22 0703  Weight: 67.6 kg 67.6 kg    General:  chronically ill appearing male HEENT: pale, mm dry  Neuro: sedated CV: S1 s2 , rrr  PULM:  clear bilaterally  GI: soft, non-distended Extremities: trace edema. Left groin wound covered Skin: no evidence of rash  Resolved Hospital Problem list   Septic shock Acute respiratory failure with hypoxia  Assessment & Plan:   Acute blood loss anemia secondary to hematochezia Acute lower GI bleeding Recurrent GI bleeding over the past few weeks secondary to rectal ulcers likely from his Crohn's disease.   Flex sig 4/6 with 5 non-bleeding ulcers identified He underwent colonoscopy  which showed multiple rectal ulcers, one of the ulcers showed visible bleeding vessel, clips applied and hemorrhoids 4/18 - again with massive rectal bleeding. Ulcer that was clipped prior with bleeding, hemostatic gel applied. No further bleeding since. Case discussed with GI, general surgery, and vascular surgery. Do not believe candidate for IR intervention with anatomy of vasculature. If any additional bleed  will plan to take to OR for further intervention. In depth conversation with vascular/general surgery and patient's daugther in terms of poor prognoiss.  -Continue to monitor for any additional bleeding, consult surgery immediately if so -Hemoglobin stable at 11 with normal platelets, we will hold any further transfusions at this time -Appreciate general surgery, vascular surgery and GI assistance -He has remained intubated in the event further interventions needed today. Because none planned, will extubate.   Peripheral vascular disease Lt common femoral artery pseudoaneurysm  s/p Lt groin hematoma evacuation, primary repair of distal external iliac artery. Further complicated 4/3 by L groin bleeding and R/p bleed with new pseudoaneurysm and aterial thrombus. Now s/p  iliofemoral bypass, removal of infected patch, sartorius flap. Vascular evaluated today and dressing changed -Postop care per vascular surgery -Continue wound VAC  MRSA bacteremia 2/2 post-op wound infection with MRSA and VRE WBC stable at 21. Last ID note 4/8, recommending daptomycin and monitoring of CPK. Will need 6 weeks of abx and indefinite PO suppression TEE negative for veg Surgical cultures grew MRSA and E. Faecium/VRE [facility] 3/27 negative -Daptomycin per ID, follow CK every Monday.  -Compelte IV course 05/17, PO abx indefinitely  ESRD Hyponatremia, improved Electrolytes stable.  -Nephrology is following -HD per nephrology via RIJ tunneled catheter.  -Closely monitor electrolytes  Deep pressure injury to sacrum Per wound care nursing staff has significant worsening of wound. Will continue PT hydrotherapy and wound care. If stabilizes from GI perspective can consider surgical consult for debridement. Long term prognosis for healing of this wound is unlikely with his poor vasculature, malnutrition and ongoing renal failure.  -follow wound care recommendations -hydrotherapy with PT  Retroperitoneal hemorrhage,  stable Resolved  Chronic systolic CHF with EF 35 to 40% on Echo from 06/29/22. Hx of pulmonary hypertension. -Holding toprolol and plavix  Bradycardia Likely secondary to his propofol -continue to monitor once sedatives off  Acute metabolic encephalopahy from uremia, acidosis. Acute 4mm right subinsular stroke  -CT head on 3/24 was normal -Sedated today, weaning and plan to extubate  Moderate malnutrition -Restart feedings today  Best Practice (right click and "Reselect all SmartList Selections" daily)   Diet/type: Resume NPO DVT prophylaxis: SCD GI prophylaxis: PPI. Lines: Dialysis Catheter tunneled  Foley:  N/A Code Status:  full code Last date of multidisciplinary goals of care discussion Palliative care following. Will readdress goals of care after patient extubated. He is critically ill with multi-organ failure, malnutrition, sacral wound, and limb ischemia high risk for limb loss  Thalia Bloodgood DO  Internal Medicine Resident PGY-3 [place]  Pager: [phone]

## 2022-09-24 NOTE — Progress Notes (Incomplete)
I was called by Dr. Margo Aye of the [facility] hospitalist service to urgently reevaluate this patient for brisk lower GI bleeding.  As I was already in the hospital, I was able to come up and see him fairly quickly, and both Dr. Margo Aye and the patient's nurse were at the bedside when I saw [name].   Chart review indicates he has had persistent lower GI bleeding from large rectal ulcers that are felt likely to be Crohn's disease.  He had lower GI endoscopic evaluations and treatment April 6, April 9 and April 11 by Dr. Tomasa Rand.  On the most recent scope, I ulcer with a visible vessel was injected with epinephrine and bipolar cautery applied followed by hemostatic gel.  This patient was reportedly stable afterwards, and earlier today Dr. Rhea Belton saw the patient and signed off with plans for outpatient follow-up.  Within the last couple of hours, this patient began passing profuse bright red blood per rectum.  He denies abdominal pain.  At the time I saw him, his blood pressure was 140s over 60s with a pulse in the 60s.  He was alert and conversational. He had not had a repeat hemoglobin hematocrit, but due to the volume of bleeding, Dr. Margo Aye elected to transfuse the patient 1 unit PRBCs.   This patient is almost certainly rebleeding from a rectal source given the recent events.  He is in need of urgent endoscopic evaluation for hopeful localization and treatment of the ulcer bleeding. We have spoken with the anesthesia service

## 2022-09-24 NOTE — Op Note (Signed)
[facility] Patient Name: Albert Harris Procedure Date : 09/24/2022 MRN: [id number] Attending MD: Starr Lake. Myrtie Neither , MD, [id number] Date of Birth: September 03, 1955 CSN: [id number] Age: 67 Admit Type: Inpatient Procedure:                Flexible Sigmoidoscopy Indications:              Hematochezia, Recurrent rectal ulcer bleeding Providers:                Sherilyn Cooter L. Myrtie Neither, MD, Jasmine Pang, RN,                            Leanne Lovely, Technician Referring MD:             Initially [facility] hospitalist, then ICU team Medicines:                General Anesthesia Complications:            No immediate complications. Estimated Blood Loss:     Estimated blood loss was minimal. Procedure:                Pre-Anesthesia Assessment:                           - Prior to the procedure, a History and Physical                            was performed, and patient medications and                            allergies were reviewed. [name] tolerance of                            previous anesthesia was also reviewed. The risks                            and benefits of the procedure and the sedation                            options and risks were discussed with the patient.                            All questions were answered, and informed consent                            was obtained. Prior Anticoagulants: The patient has                            taken no anticoagulant or antiplatelet agents. ASA                            Grade Assessment: IV - A patient with severe                            systemic disease that is a constant threat to life.  After reviewing the risks and benefits, the patient                            was deemed in satisfactory condition to undergo the                            procedure.                           After obtaining informed consent, the scope was                            passed under direct vision. The  [id number]-[id number] ([id number])                            Olympus coloscope was introduced through the anus                            and advanced to the the descending colon. The                            flexible sigmoidoscopy was performed with                            difficulty due to poor endoscopic visualization.                            The patient tolerated the procedure fairly well.                            The quality of the bowel preparation was poor (no                            bowel prep was used, emergent procedure). Scope In: 1:27:07 AM Scope Out: 1:51:25 AM Total Procedure Duration: 0 hours 24 minutes 18 seconds  Findings:      The perianal and digital rectal examinations were normal except for       fresh and clotted blood was pouring from the rectum.      Multiple diverticula were found in the sigmoid colon and descending       colon.      Red blood was found in the rectum, in the sigmoid colon and in the       descending colon. This required suctioning and dragging of clots out of       the rectum.      An ulcer was found in the distal rectum just above the anal verge toward       the posterior wall he had had a large protruding and adherent polypoid       mass of fibrinous tissue. No bleeding was present.      Another large ulcer was found in the distal rectum just above the anal       verge toward the left lateral wall. Brisk bleeding was present,       continuously flowing from the ulcer base. There was a hemostatic clip       for  prior procedure still adherent to the wall at the edge of this       ulcer.. To stop active bleeding, hemostatic gel was deployed. (Purastat       - two 3ml doses) There was no bleeding at the end of the procedure. Impression:               - Preparation of the colon was poor.                           - Diverticulosis in the sigmoid colon and in the                            descending colon.                           - Blood in the  rectum, in the sigmoid colon and in                            the descending colon.                           - An ulcer in the distal rectum that was not                            bleeding.                           - An ulcer in the distal rectum that was briskly                            bleeding. Hemostatic gel applied with control of                            bleeding.                           - No specimens collected. Recommendation:           - Keep patient intubated overnight                           Serial hemoglobin and hematocrit per ICU team and                            transfuse as needed.                           Patient's daughter updated                           Daytime GI consult team will be updated with                            recommendation to contact interventional radiology                            in the morning. Review  of patient's overall                            condition regarding vascular disease and recent                            radiographic findings regarding his severe                            calcified and thrombotic vascular disease suggest                            that this bleeding source most likely cannot be                            accessed angiographically. Procedure Code(s):        --- Professional ---                           [phone], Sigmoidoscopy, flexible; with control of                            bleeding, any method Diagnosis Code(s):        --- Professional ---                           K62.5, Hemorrhage of anus and rectum                           K92.2, Gastrointestinal hemorrhage, unspecified                           K62.6, Ulcer of anus and rectum                           K92.1, Melena (includes Hematochezia)                           K57.30, Diverticulosis of large intestine without                            perforation or abscess without bleeding CPT copyright 2022 [employer]. All rights  reserved. The codes documented in this report are preliminary and upon coder review may  be revised to meet current compliance requirements. Davidmichael Zarazua L. Myrtie Neither, MD 09/24/2022 2:37:07 AM This report has been signed electronically. Number of Addenda: 0

## 2022-09-24 NOTE — Progress Notes (Signed)
Pt is transferred to ICU,because of massive and persistent bleeding per rectum.Pt is alert and oriented.SBP in the 100's.2nd unit of PRBC running out of 3.Bed side report give to pt's RN.

## 2022-09-24 NOTE — Progress Notes (Signed)
Asked to assist with tx to ICU. Pt txed to 2M12 with 5W RN and 5W CN.

## 2022-09-24 NOTE — Progress Notes (Signed)
PT Cancellation Note  Patient Details Name: Albert Harris MRN: [id number] DOB: 07/02/1955   Cancelled Treatment:    Reason Eval/Treat Not Completed: Patient not medically ready - pt intubated overnight, just extubated. RN requests check back tomorrow.  Marye Round, PT DPT Acute Rehabilitation Services Pager [phone]  Office [phone]    Truddie Coco 09/24/2022, 3:33 PM

## 2022-09-24 NOTE — Progress Notes (Signed)
Full procedure report in Epic.  Sigmoidoscopy found bleeding distal rectal ulcer - controlled (for now) with hemostatic gel.  Other findings and recs in full report.  Daughter updated in person.  Dr. Rhea Belton will see patient later today.  -  - Amada Jupiter, MD    [facility]

## 2022-09-24 NOTE — Procedures (Signed)
Extubation Procedure Note  Patient Details:   Name: Albert Harris DOB: [birth year] MRN: [id number]   Airway Documentation:    Vent end date: 09/24/22 Vent end time: 1400   Evaluation  O2 sats: stable throughout Complications: No apparent complications Patient did tolerate procedure well. Bilateral Breath Sounds: Rhonchi   Yes  Patient was extubated to 2L [place] without complications and was able to tell his name after extubation.  Cuff leak was audible.  RT will monitor as needed.  Benson Setting 09/24/2022, 2:45 PM

## 2022-09-24 NOTE — Progress Notes (Addendum)
NAME:  Albert Harris, MRN:  [id number], DOB:  07-27-1955, LOS: 27 ADMISSION DATE:  08/28/2022, CONSULTATION DATE:  08/28/22 REFERRING MD: Constance Goltz , CHIEF COMPLAINT:  Abdominal pain  History of Present Illness:  67 yo male brought to ER with abdominal pain, nausea, diaphoresis and unable to feel his Lt foot.  Hypotensive with lactic acidosis in ER.  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  Hx of ESRD and had hyperkalemia.  Had emergent CRRT.  Vascular consulted and taken to OR.    Pertinent  Medical History  ESRD -AV fistula (no flow tonight), PAD, COPD, HLD, HTN, HFrEF 35-40%, PAD  Significant Hospital Events: Including procedures, antibiotic start and stop dates in addition to other pertinent events   3/22 Admit, emergent CRRT, to OR for repair of distal Lt external iliac artery 3/24 Extubated. CT head obtained for altered mental status did not show any acute abnormalities 3/26 Off CRRT and pressors, Vascular surgery consulted for clotted AVG 3/27 AV graft thrombectomy attempted in OR but patient became hemodynamically unstable and procedure was aborted.  Was on pressors briefly 3/28 dialysis followed by CVC and aline removal for line holiday 3/29 MRI brain ordered for LLE weakness and found with right subinsular 4mm infarct. Neuro consulted 3/30 CT A/P increased hematoma in left pelvic region associated with Hg drop 9>7 4/1 hgb stable, TEE, new tunneled line planned 4/3 L groin bleeding. Taken to OR urgently by [facility]VS for iliofemoral bypass, removal of infected patch, sartorius flap.  4/6, 4/7 ongoing rectal bleeding. Transfused both days.  4/6 flex sig with multiple non-bleeding ulcers.  4/8 ongoing rectal bleeding. Transfusing again. Did not drink bowel prep for colonoscopy. Aborted.  PCCM called back.  4/11 two large bloody BM's overnight with Hgb drop from 7.8 to 6.5 with hypotension, moved back to ICU and transferred  4/18 called to the floor for massive  hemoptysis   Interim History / Subjective:   Patient with massive amounts of bleeding from the rectum and hypotensive   Objective   Blood pressure (!) 85/76, pulse 64, temperature 98 F (36.7 C), temperature source Oral, resp. rate 16, height  (1.727 m), weight 67.6 kg, SpO2 100 %.    Vent Mode: PRVC FiO2 (%):  [100 %] 100 % Set Rate:  [16 bmp] 16 bmp Vt Set:  [540 mL] 540 mL PEEP:  [5 cmH20] 5 cmH20 Plateau Pressure:  [13 cmH20] 13 cmH20  No intake or output data in the 24 hours ending 09/24/22 0126 Filed Weights   09/19/22 0741 09/19/22 1214 09/22/22 0500  Weight: 66.3 kg 67.3 kg 67.6 kg    General:  chronically ill appearing male HEENT: pale, mm dry  Neuro: alert oriented  CV: S1 s2 , rrr  PULM:  clear bilaterally  GI: incision, abd distended, massive bleedin from rectum  Extremities: trace, Skin: areas of brusings and skin breakdown    Resolved Hospital Problem list   Septic shock Acute respiratory failure with hypoxia  Assessment & Plan:   Acute blood loss anemia secondary to hematochezia Acute lower GI bleeding Flex sig 4/6 with 5 non-bleeding ulcers identified He underwent colonoscopy which showed multiple rectal ulcers, one of the ulcers showed visible bleeding vessel, clips applied and hemorrhoids 4/18 - again with massive rectal bleeding P: Lots of blood products  Multiple additional units  Active resuscitation  Move to icu urgently  Will need CVVHD likely tJAVARES KAUFHOLDremove volume Repeat urgent flex sig Possible IR intervention  needed Discussed with GI    Retroperitoneal hemorrhage, stable Initially had increased L flank pain, however resolved  Peripheral vascular disease Lt common femoral artery pseudoaneurysm s/p Lt groin hematoma evacuation, primary repair of distal external iliac artery. Further complicated 4/3 by L groin bleeding and R/p bleed with new pseudoaneurysm and aterial thrombus. Now s/p  iliofemoral bypass, removal of infected  patch, sartorius flap.  -Postop care per vascular surgery -Continue wound VAC  MRSA bacteremia 2/2 post-op wound infection with MRSA and VRE TEE negative for veg Surgical cultures grew MRSA and E. Faecium/VRE [facility] 3/27 negative Plan: Daptomycin per ID   ESRD Hyponatremia, improved -Nephrology is following -HD per nephrology via RIJ tunneled catheter.  -Closely monitor electrolytes  Chronic systolic CHF with EF 35 to 40% on Echo from 06/29/22. Hx of pulmonary hypertension. -holding  toprol, plavix for now, hold norvasc  Acute metabolic encephalopahy from uremia, acidosis. Acute 4mm right subinsular stroke  -CT head on 3/24 was normal -Mental status has improved  Moderate malnutrition -Continue dietary supplements, npo tonight  Best Practice (right click and "Reselect all SmartList Selections" daily)   Diet/type: Resume NPO DVT prophylaxis: SCD GI prophylaxis: PPI. Lines: Dialysis Catheter tunneled  Foley:  N/A Code Status:  full code Last date of multidisciplinary goals of care discussion [pending]  This patient is critically ill with multiple organ system failure; which, requires frequent high complexity decision making, assessment, support, evaluation, and titration of therapies. This was completed through the application of advanced monitoring technologies and extensive interpretation of multiple databases. During this encounter critical care time was devoted to patient care services described in this note for 75 minutes.   Josephine Igo, DO [facility] 09/24/2022 1:26 AM

## 2022-09-24 NOTE — Consult Note (Signed)
WOC Nurse Consult Note: this patient is familiar to WOC team from this admission  Reason for Consult: reassess sacral wound  Wound type: Former Deep Tissue Pressure Injury that has now evolved to Unstageable Pressure Injury  Pressure Injury POA: Yes Measurement: 8 cms x 13 cms x 1 cm 80% black tan devitalized tissue 20% pink moist   Drainage (amount, consistency, odor) moderate tan exudate, malodorous Periwound: erythematous  Dressing procedure/placement/frequency: Clean sacral wound with NS, apply Dakin's moistened gauze twice daily to wound bed x 7 days, cover with dry gauze and ABD pad or silicone foam whichever is preferred.  After 7 days of Dakin's, begin Santyl (10/01/2022) as follows:  Apply 1/4" thick layer of Santyl to wound bed, top with saline moist gauze. Top with silicone foam or ABD pad and tape whichever preferred.  Ok to lift foam daily for change of gauze and reapplication of Santyl. Ok to change silicone foam every 3 days.    Patient should remain on low air loss mattress throughout hospitalization.    POC discussed with bedside nurse and MD.  Patient continues to receive PT hydrotherapy.  Surgical consult for possible surgical debridement if this is an option.    WOC team will not follow at this time.  Re-consult if further needs arise.   Thank you,    Priscella Mann MSN, RN-BC, [employer] [phone]

## 2022-09-24 NOTE — Procedures (Signed)
Central Venous Catheter Insertion Procedure Note  Albert Harris  [id number]  September 02, 1955  Date:09/24/22  Time:3:01 AM   Provider Performing:Mamoru Takeshita Judie Petit Pecola Leisure   Procedure: Insertion of Non-tunneled Central Venous [phone]) with US guidance (40102)   Indication(s) Medication administration and Difficult access  Consent Risks of the procedure as well as the alternatives and risks of each were explained to the patient and/or caregiver.  Consent for the procedure was obtained and is signed in the bedside chart  Anesthesia Topical only with 1% lidocaine   Timeout Verified patient identification, verified procedure, site/side was marked, verified correct patient position, special equipment/implants available, medications/allergies/relevant history reviewed, required imaging and test results available.  Sterile Technique Maximal sterile technique including full sterile barrier drape, hand hygiene, sterile gown, sterile gloves, mask, hair covering, sterile ultrasound probe cover (if used).  Procedure Description Area of catheter insertion was cleaned with chlorhexidine and draped in sterile fashion.  With real-time ultrasound guidance a central venous catheter was placed into the left internal jugular vein. Nonpulsatile blood flow and easy flushing noted in all ports.  The catheter was sutured in place and sterile dressing applied.    Complications/Tolerance None; patient tolerated the procedure well. Chest X-ray is ordered to verify placement for internal jugular or subclavian cannulation.   Chest x-ray is not ordered for femoral cannulation.  EBL Minimal  Specimen(s) None  Tim Lair, [place] [facility] 09/24/22 3:02 AM  Please see [email or web address] for pager details.  From 7A-7P if no response, please call [phone] After hours, please call ELink [phone]

## 2022-09-24 NOTE — Procedures (Signed)
Intubation Procedure Note  ISABEL FREESE  [id number]  02/05/1956  Date:09/24/22  Time:7:58 AM   Provider Performing:Yamilee Harmes L Feleshia Zundel    Procedure: Intubation (31500)  Indication(s) Respiratory Failure  Consent Risks of the procedure as well as the alternatives and risks of each were explained to the patient and/or caregiver.  Consent for the procedure was obtained and is signed in the bedside chart   Anesthesia Etomidate, Versed, Fentanyl, and Rocuronium   Time Out Verified patient identification, verified procedure, site/side was marked, verified correct patient position, special equipment/implants available, medications/allergies/relevant history reviewed, required imaging and test results available.   Sterile Technique Usual hand hygeine, masks, and gloves were used   Procedure Description Patient positioned in bed supine.  Sedation given as noted above.  Patient was intubated with endotracheal tube using Glidescope.  View was Grade 1 full glottis .  Number of attempts was 1.  Colorimetric CO2 detector was consistent with tracheal placement.   Complications/Tolerance None; patient tolerated the procedure well. Chest X-ray is ordered to verify placement.   EBL Minimal   Specimen(s) None  Josephine Igo, DO [facility] 09/24/2022 7:58 AM

## 2022-09-24 NOTE — Progress Notes (Addendum)
Updated the patient's daughter Tama High via phone.  All questions answered.    Called the patient's wife multiple times but no answer, left voicemail messages.  Addendum:  Was able to provide updates to the patient's wife, [name], via phone.  All questions answered.    Also called the patient's daughter, [name], who is also POA.  She was very grateful for all the teams involved, particularly Dr. Myrtie Neither and Dr. Myrlene Broker interventions.

## 2022-09-24 NOTE — Progress Notes (Signed)
I was called by Dr. Margo Aye of the [facility] hospitalist service to urgently reevaluate this patient for brisk lower GI bleeding.  As I was already in the hospital, I was able to come up and see him fairly quickly, and both Dr. Margo Aye and the patient's nurse were at the bedside when I saw Mr. Odor.   Chart review indicates he has had persistent lower GI bleeding from large rectal ulcers that are felt likely to be Crohn's disease.  He had lower GI endoscopic evaluations and treatment April 6, April 9 and April 11 by Dr. Tomasa Rand.  On the most recent scope, I ulcer with a visible vessel was injected with epinephrine and bipolar cautery applied followed by hemostatic gel.  This patient was reportedly stable for days afterwards, and earlier today Dr. Rhea Belton saw the patient and signed off with plans for outpatient follow-up.  Within the last couple of hours, this patient began passing profuse bright red blood per rectum.  He denies abdominal pain.  At the time I saw him, his blood pressure was 140s over 60s with a pulse in the 60s.  He was alert and conversational. He had not had a repeat hemoglobin hematocrit, but due to the volume of bleeding, Dr. Margo Aye elected to transfuse the patient 1 unit PRBCs.   This patient is almost certainly rebleeding from a rectal source given the recent events.  He is in need of urgent endoscopic evaluation for hopeful localization and treatment of the ulcer bleeding.  If we are unable to achieve that, then we will need the assistance of interventional radiology. We have spoken with the anesthesia service for assistance with sedation and supportive care when we bring this patient to the endoscopy department. Because this man reports having had solid food within several hours prior to my evaluation, he will require intubation for airway protection on the advice of Dr. Krista Blue from anesthesia.  I agree with that plan because I deem this procedure to be an emergency.  Dr. Margo Aye was  communicating with this patient's wife, and Mr. Ziegler was agreeable to a sigmoidoscopy tonight.  The benefits and risks of the planned procedure were described in detail with the patient or (when appropriate) their health care proxy.  Risks were outlined as including, but not limited to, bleeding, infection, perforation, adverse medication reaction leading to cardiac or pulmonary decompensation, pancreatitis (if ERCP).  The limitation of incomplete mucosal visualization was also discussed.  No guarantees or warranties were given.   Amada Jupiter, MD    [facility]

## 2022-09-24 NOTE — Progress Notes (Signed)
Reevaluated patient when Dr.Icard of critical care was at the bedside.  Patient has been intermittently hypotensive with systolic pressure in the 80s, receiving another unit PRBCs.  Still passing a large amount of bright red blood per rectum.   Plan has changed to move this patient to the ICU where he will be intubated by Dr. Tonia Brooms and the procedure done at the bedside.   - Amada Jupiter, MD    [facility]

## 2022-09-24 NOTE — Progress Notes (Addendum)
Progress Note    09/24/2022 11:49 AM Day of Surgery  Subjective:  intubated and sedated   Vitals:   09/24/22 1120 09/24/22 1130  BP:  (!) 95/54  Pulse: (!) 51 (!) 51  Resp: 16 16  Temp:    SpO2: 100% 100%   Physical Exam: Lungs:  mechanical ventilation Incisions:  L groin wound bed with some visible prolene; no purulence or bleeding Neurologic: sedated    CBC    Component Value Date/Time   WBC 21.0 (H) 09/24/2022 0400   RBC 3.80 (L) 09/24/2022 0400   HGB 11.6 (L) 09/24/2022 0400   HGB 10.4 (L) 06/18/2021 1431   HCT 34.2 (L) 09/24/2022 0400   HCT 31.6 (L) 06/18/2021 1431   PLT 179 09/24/2022 0400   PLT 142 (L) 06/18/2021 1431   MCV 90.0 09/24/2022 0400   MCV 96 06/18/2021 1431   MCH 30.5 09/24/2022 0400   MCHC 33.9 09/24/2022 0400   RDW 18.0 (H) 09/24/2022 0400   RDW 14.3 06/18/2021 1431   LYMPHSABS 1.5 08/28/2022 1810   LYMPHSABS 1.5 06/18/2021 1431   MONOABS 0.7 08/28/2022 1810   EOSABS 0.1 08/28/2022 1810   EOSABS 1.0 (H) 06/18/2021 1431   BASOSABS 0.1 08/28/2022 1810   BASOSABS 0.1 06/18/2021 1431    BMET    Component Value Date/Time   NA 136 09/24/2022 0400   NA 141 06/18/2021 1431   K 4.3 09/24/2022 0400   CL 97 (L) 09/24/2022 0400   CO2 25 09/24/2022 0400   GLUCOSE 121 (H) 09/24/2022 0400   BUN 54 (H) 09/24/2022 0400   BUN 20 06/18/2021 1431   CREATININE 5.69 (H) 09/24/2022 0400   CREATININE 1.15 02/19/2017 1049   CALCIUM 7.9 (L) 09/24/2022 0400   CALCIUM 8.5 (L) 06/10/2020 0900   GFRNONAA 10 (L) 09/24/2022 0400   GFRAA 6 (L) 03/04/2020 1045    INR    Component Value Date/Time   INR 1.2 08/12/2022 1501     Intake/Output Summary (Last 24 hours) at 09/24/2022 1149 Last data filed at 09/24/2022 0900 Gross per 24 hour  Intake 4789.14 ml  Output --  Net 4789.14 ml     Assessment/Plan:  67 y.o. male is s/p  iliofemoral bypass, removal of infected patch with sartorius muscle flap  Day of Surgery   L groin vac changed at the  bedside; no sign of infection.  There was some suture material in the wound bed.  Nutritional status makes wound breakdown likely.  Agree that prognosis is poor.  Vascular will continue to follow   Emilie Rutter, PA-C [facility] [phone] 09/24/2022 11:49 AM  I sat down and had a long conversation with Albert Harris's daughter today.  He continues to have difficulty with rectal hemorrhage.  He is undergone multiple interventions in an effort to stop this bleeding with GI.  General surgery is now involved, and plans to intervene should bleeding occur again. Complicating his overall picture is the sacral wound, which may or may not heal.  He has very limited pelvic perfusion due to bilateral internal iliac artery occlusion. We had an honest discussion regarding his overall health and prognosis.  I think this is poor.  In the future, he would require a left leg amputation, however this is nonurgent.  With his current medical comorbidities, [name] has a high probability of in-hospital mortality.  Should general surgeon intervention not work, we discussed possible palliative measures.  Fortunately, Abb's daughter is very realistic, and very appreciative of the  care he he has received here at [facility].  Left-sided wound healing appropriately.  Will continue to follow.  VAC change next Monday.

## 2022-09-24 NOTE — Progress Notes (Signed)
Progress Note  Primary GI: Unassigned  LOS: 27 days   Chief Complaint:Recurrent hematochezia    Subjective  Patient intubated. Arouses to voice.  No family was present at the time of my evaluation.   Objective   Vital signs in last 24 hours: Temp:  [97.4 F (36.3 C)-98.2 F (36.8 C)] 97.5 F (36.4 C) (04/18 0810) Pulse Rate:  [45-71] 54 (04/18 1215) Resp:  [0-22] 18 (04/18 1215) BP: (82-192)/(19-109) 122/60 (04/18 1215) SpO2:  [95 %-100 %] 100 % (04/18 1215) FiO2 (%):  [35 %-100 %] 35 % (04/18 1120) Weight:  [67.6 kg] 67.6 kg (04/18 0703) Last BM Date : 09/23/22 Last BM recorded by nurses in past 5 days Stool Type: Type 7 (Liquid consistency with no solid pieces) (09/23/2022 10:38 PM)  General:   male intubated, arouses to voice Heart:  Regular rate and rhythm; no murmurs Pulm: ventilated breath sounds Abdomen: soft, nondistended, normal bowel sounds in all quadrants. Nontender without guarding. No organomegaly appreciated. Extremities:  No edema Neurologic:  arouse to voice  Intake/Output from previous day: 04/17 0701 - 04/18 0700 In: 4351.2 [I.V.:85.3; Blood:4103.9; IV Piggyback:162] Out: -  Intake/Output this shift: Total I/O In: 437.9 [I.V.:65.9; IV Piggyback:372] Out: -   Studies/Results: DG Chest Port 1 View  Result Date: 09/24/2022 CLINICAL DATA:  The central line placement EXAM: PORTABLE CHEST 1 VIEW COMPARISON:  09/24/2022 FINDINGS: The heart size and mediastinal contours are within normal limits. Both lungs are clear. The visualized skeletal structures are unremarkable. Interval placement of left IJ approach central venous catheter with tip in the lower SVC. The remainder of the support apparatus is unchanged. IMPRESSION: Interval placement of left IJ approach central venous catheter with tip in the lower SVC. No pneumothorax. Electronically Signed   By: Deatra Robinson M.D.   On: 09/24/2022 03:22   Portable Chest x-ray  Result Date: 09/24/2022 CLINICAL  DATA:  ETT EXAM: PORTABLE CHEST 1 VIEW COMPARISON:  09/13/2022 FINDINGS: Endotracheal tube terminates 5 cm above the carina. Lungs are essentially clear.  No pleural effusion or pneumothorax. Mild cardiomegaly. Right IJ catheter terminates at the cavoatrial junction. IMPRESSION: Endotracheal tube terminates 5 cm above the carina. Electronically Signed   By: Charline Bills M.D.   On: 09/24/2022 01:42    Lab Results: Recent Labs    09/22/22 2038 09/23/22 0317 09/24/22 0318 09/24/22 0400  WBC 20.7* 16.9*  --  21.0*  HGB 11.2* 10.6* 11.2* 11.6*  HCT 34.9* 31.9* 33.0* 34.2*  PLT 186 188  --  179   BMET Recent Labs    09/22/22 0331 09/23/22 0317 09/24/22 0318 09/24/22 0400  NA 135 135 135 136  K 5.0 3.9 4.0 4.3  CL 98 97*  --  97*  CO2 24 27  --  25  GLUCOSE 110* 107*  --  121*  BUN 46* 36*  --  54*  CREATININE 6.12* 4.59*  --  5.69*  CALCIUM 9.0 8.4*  --  7.9*   LFT Recent Labs    09/24/22 0400  PROT 4.5*  ALBUMIN 1.7*  AST 19  ALT 11  ALKPHOS 92  BILITOT 1.6*   PT/INR No results for input(s): "LABPROT", "INR" in the last 72 hours.   Scheduled Meds:  chlorhexidine  15 mL Mouth/Throat Once   Chlorhexidine Gluconate Cloth  6 each Topical Q0600   darbepoetin (ARANESP) injection - DIALYSIS  200 mcg Subcutaneous Q Wed-1800   leptospermum manuka honey  1 Application Topical Daily   liver  oil-zinc oxide   Topical TID   mesalamine  1,000 mg Rectal BID   mouth rinse  15 mL Mouth Rinse Q2H   pantoprazole (PROTONIX) IV  40 mg Intravenous Q12H   sodium chloride flush  10-40 mL Intracatheter Q12H   Continuous Infusions:  sodium chloride Stopped (09/22/22 0757)   DAPTOmycin (CUBICIN) 600 mg in sodium chloride 0.9 % IVPB 124 mL/hr at 09/24/22 0800   DAPTOmycin (CUBICIN) 750 mg in sodium chloride 0.9 % IVPB 130 mL/hr at 09/24/22 0800   fentaNYL infusion INTRAVENOUS 100 mcg/hr (09/24/22 0900)   norepinephrine (LEVOPHED) Adult infusion 2 mcg/min (09/24/22 0900)   propofol  (DIPRIVAN) infusion 20 mcg/kg/min (09/24/22 0900)   sodium chloride        Patient profile:   67 year old male with ESRD on HD, admitted with septic shock from MRSA bacteria from a left groin wound with bleeding/retroperitoneal hematoma s/p L common iliac to profunda femoris bypass and L common iliac artery angioplasty (09/09/2022) with recurrent lower GI bleed with rectal ulcers, left-sided diverticulosis, possible new onset Crohn's disease with right sided colonic ulceration.     WORKUP: Flex sig 4/7 with multiple rectal ulcers without stigmata. CT-A 4/7 negative for active bleeding.    Colonoscopy 09/15/2022: 2 large ulcers in distal rectum, clip placed. Ulcers in ascending colon, cecum, ileocecal valve, biopsied. Diverticulosis in descending and sigmoid colon. Inflamed mucosa in sigmoid colon, biopsied. 2 mm polyp in distal transverse colon, resected. Nonbleeding internal hemorrhoids.    Flex sigmoidoscopy 4/11: 2 large distal ulcers in distal rectum, injected. treated with bipolar cautery.  Hemostatic gel applied.  Diverticulosis in sigmoid.  No specimens collected  Flex sigmoidoscopy 09/24/2022: Blood in rectum, sigmoid, and descending colon.  Ulcer in distal rectum not bleeding.  Ulcer in distal rectum that was briskly bleeding.  Hemostatic gel applied with control of bleeding.   Impression:   Recurrent Hematochezia; likely secondary rectal ulcers/Crohn's -Hemoglobin currently stable at 11.6 s/p 3 units PRBCs, as well as FFP.  Overnight patient had a large amount of brisk rectal bleeding and was taken for flex sigmoidoscopy.  This showed bleeding ulcer, hemostatic gel was applied and bleeding was controlled.  -With recurrent bleeding and multiple procedures to control bleeding, suspect ulcers we will continue to bleed and hemostatic gel/clips may not be ideal to continue to control bleeding.   ESRD on HD CHF Left groin wound   Plan:   - consult general surgery for evaluation of  recurrent hematochezia from ulcers - Continue daily CBC and transfuse as needed to maintain HGB > 7  and monitor for recurrent bleeds -Continue Entocort and mesalamine  Kamen Hanken Leanna Sato  09/24/2022, 12:59 PM

## 2022-09-24 NOTE — Progress Notes (Signed)
[facility] Progress Note   Subjective:  Things went downhill for him yesterday - brisk LGIB recurred, taken urgently for sigmoidoscopy - bleeding ulcer treated with hemostatic gel, but will need further intervention. Now in ICU with hemorrhagic shock, intubated, on pressors -  received 4U PRBCS + 2U FFP + 2U plts overnight.  Objective Vitals:   09/24/22 0945 09/24/22 1000 09/24/22 1015 09/24/22 1030  BP: (!) 106/55 131/60 (!) 164/67 (!) 132/56  Pulse: (!) 50 (!) 51 (!) 56 (!) 52  Resp: Temp:      TempSrc:      SpO2: 100% 100% 100% 100%  Weight:      Height:       Physical Exam General: Intubated, on pressors Heart: RRR; no murmur Lungs: CTA anteriorly Abdomen: soft Extremities: No LE edema; L foot with ischemic distal 3rd-5th toes Dialysis Access: [facility]  Additional Objective Labs: Basic Metabolic Panel: Recent Labs  Lab 09/22/22 0331 09/23/22 0317 09/24/22 0318 09/24/22 0400  NA 135 135 135 136  K 5.0 3.9 4.0 4.3  CL 98 97*  --  97*  CO2 24 27  --  25  GLUCOSE 110* 107*  --  121*  BUN 46* 36*  --  54*  CREATININE 6.12* 4.59*  --  5.69*  CALCIUM 9.0 8.4*  --  7.9*  PHOS 3.3 3.9  --  5.7*   Liver Function Tests: Recent Labs  Lab 09/22/22 0331 09/23/22 0317 09/24/22 0400  AST  --   --  19  ALT  --   --  11  ALKPHOS  --   --  92  BILITOT  --   --  1.6*  PROT  --   --  4.5*  ALBUMIN 1.7* 1.7* 1.7*   CBC: Recent Labs  Lab 09/21/22 1934 09/22/22 0433 09/22/22 2038 09/23/22 0317 09/24/22 0318 09/24/22 0400  WBC 20.0* 21.9* 20.7* 16.9*  --  21.0*  HGB 9.0* 7.0* 11.2* 10.6* 11.2* 11.6*  HCT 28.1* 21.8* 34.9* 31.9* 33.0* 34.2*  MCV 98.6 97.8 92.6 90.6  --  90.0  PLT 223 263 186 188  --  179   Studies/Results: DG Chest Port 1 View  Result Date: 09/24/2022 CLINICAL DATA:  The central line placement EXAM: PORTABLE CHEST 1 VIEW COMPARISON:  09/24/2022 FINDINGS: The heart size and mediastinal contours are within normal limits.  Both lungs are clear. The visualized skeletal structures are unremarkable. Interval placement of left IJ approach central venous catheter with tip in the lower SVC. The remainder of the support apparatus is unchanged. IMPRESSION: Interval placement of left IJ approach central venous catheter with tip in the lower SVC. No pneumothorax. Electronically Signed   By: Deatra Robinson M.D.   On: 09/24/2022 03:22   Portable Chest x-ray  Result Date: 09/24/2022 CLINICAL DATA:  ETT EXAM: PORTABLE CHEST 1 VIEW COMPARISON:  09/13/2022 FINDINGS: Endotracheal tube terminates 5 cm above the carina. Lungs are essentially clear.  No pleural effusion or pneumothorax. Mild cardiomegaly. Right IJ catheter terminates at the cavoatrial junction. IMPRESSION: Endotracheal tube terminates 5 cm above the carina. Electronically Signed   By: Charline Bills M.D.   On: 09/24/2022 01:42    Medications:  sodium chloride Stopped (09/22/22 0757)   DAPTOmycin (CUBICIN) 600 mg in sodium chloride 0.9 % IVPB 124 mL/hr at 09/24/22 0800   DAPTOmycin (CUBICIN) 750 mg in sodium chloride 0.9 % IVPB 130 mL/hr at 09/24/22 0800   fentaNYL infusion INTRAVENOUS 100 mcg/hr (  09/24/22 0900)   norepinephrine (LEVOPHED) Adult infusion 2 mcg/min (09/24/22 0900)   propofol (DIPRIVAN) infusion 20 mcg/kg/min (09/24/22 0900)   sodium chloride      chlorhexidine  15 mL Mouth/Throat Once   Chlorhexidine Gluconate Cloth  6 each Topical Q0600   darbepoetin (ARANESP) injection - DIALYSIS  200 mcg Subcutaneous Q Wed-1800   leptospermum manuka honey  1 Application Topical Daily   liver oil-zinc oxide   Topical TID   mesalamine  1,000 mg Rectal BID   mouth rinse  15 mL Mouth Rinse Q2H   pantoprazole (PROTONIX) IV  40 mg Intravenous Q12H   phenylephrine       sodium chloride flush  10-40 mL Intracatheter Q12H    Dialysis Orders: TTS SW  4h   350/800  64.5kg  RFA AVG -> clotted/ new TDC in place Heparin none  - last OP HD 3/14, post wt 65.9kg -  venofer  IV weekly - rocaltrol 0.25 mcg po tiw - phoslo 2 ac tid - no esa, last Hb 11.0   Assessment/Plan: Recurrent lower GIB: S/p colonoscopy 09/15/22, then repeat sigmoidoscopy 4/11. Several bleeding ulcers addressed with endoscopy.  Path came back + Crohn's - started on mesalamine by GI. Brisk re-bleed 4/18, back for emergent sigmoidoscopy with hemostatic gel applied to bleeding ulcer. Now in ICU. Left common femoral artery pseudoaneurysm - s/p L femoral exploration with pseudoaneurysm repair 08/29/22, then iliofemoral bypass, removal of infected patch with sartorius muscle flap on 09/09/22. Ischemic changes to L 3rd-5th toes - still high risk of limb loss. Plan pending at this time.   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  CVA: + stroke on MRI. Neuro signed off.  ESRD: Continue HD on TTS schedule, did require short-term CRRT 3/23- 3/26. HD today, planned for later this afternoon in ICU. BP/volume: Variable, no edema on exam. Anemia (ESRD + ABLA): Last transfused 4U on 4/17 (that brings total to 23U this admit). Also on Aranesp q Wed (max dose).  Secondary HPTH: CorrCa high, Phos ok. Holding VDRA for now. Will work to get him off  Phoslo as outpatient - can continue for now. Dialysis access: AVG clotted 3/27, went for declot by VVS and then re-clotted. VVS placed R TDC on 4/1.  HFrEF (EF 35-40%)  Ozzie Hoyle, PA-C 09/24/2022, 10:45 AM  [facility]

## 2022-09-24 NOTE — Consult Note (Signed)
Reason for Consult:rectal bleeding Referring Physician: Dr. Lynnell Catalan  Albert Harris is an 67 y.o. male.  HPI: We have been asked to evaluate this patient for rectal bleeding.  He is currently in the intensive care unit on the ventilator.  He has a very significant past medical history including end-stage renal disease on hemodialysis and severe vascular disease along with a history of CHF.  He has developed rectal bleeding for bleeding ulcers.  He has had 2 flexible sigmoidoscopies on 4/11 and early this morning.  He is required multiple transfusions secondary to this.  He is currently not actively bleeding.  He has a least 2 ulcers that been localized by gastroenterology and a clip is been placed 1 he has had Gelfoam placed as well.  He has required several urgent vascular procedures during this admission as well.  Past Medical History:  Diagnosis Date   Acute metabolic encephalopathy 05/09/2021   Acute on chronic diastolic CHF (congestive heart failure) 12/15/2020   Acute respiratory disease due to COVID-19 virus 06/16/2020   Alcohol use disorder 06/30/2022   Anemia    Asthma    Bacteremia due to Gram-positive bacteria 05/09/2021   Carotid stenosis 09/12/2019   CHF (congestive heart failure)    Claudication in peripheral vascular disease 09/12/2019   Complication of anesthesia    " i HAVE A HARD TIME WAKING UP " (only one time)   COPD (chronic obstructive pulmonary disease)    COVID-19    positive on  06/16/20   Critical limb ischemia of right lower extremity    ESRD on hemodialysis    t, th. sat dialysis   GERD (gastroesophageal reflux disease)    HLD (hyperlipidemia)    Hypertension    Hypertensive urgency 12/24/2016   Lower GI bleed    Nausea vomiting and diarrhea 08/13/2022   Peripheral vascular disease    PFO with atrial septal aneurysm    by echo 11/2017   Pneumonia    Stroke    Surgical wound infection 09/20/2021   [name] (transient ischemic attack) 11/2017   Tobacco  abuse    quit 07/17/19   Volume overload 12/14/2020    Past Surgical History:  Procedure Laterality Date   ABDOMINAL AORTOGRAM W/LOWER EXTREMITY N/A 06/21/2020   Procedure: ABDOMINAL AORTOGRAM W/LOWER EXTREMITY;  Surgeon: Chuck Hint, MD;  Location: [facility] INVASIVE CV LAB;  Service: Cardiovascular;  Laterality: N/A;   ABDOMINAL AORTOGRAM W/LOWER EXTREMITY Right 08/29/2021   Procedure: ABDOMINAL AORTOGRAM W/LOWER EXTREMITY;  Surgeon: Leonie , MD;  Location: [facility] INVASIVE CV LAB;  Service: Cardiovascular;  Laterality: Right;   ABDOMINAL AORTOGRAM W/LOWER EXTREMITY N/A 08/10/2022   Procedure: ABDOMINAL AORTOGRAM W/LOWER EXTREMITY;  Surgeon: Chuck Hint, MD;  Location: [facility] INVASIVE CV LAB;  Service: Cardiovascular;  Laterality: N/A;   AORTOGRAM N/A 08/22/2022   Procedure: AORTOGRAM;  Surgeon: Maeola Harman, MD;  Location: [facility] OR;  Service: Vascular;  Laterality: N/A;   AORTOGRAM Right 09/09/2022   Procedure: AORTOGRAM;  Surgeon: Leonie , MD;  Location: [facility] OR;  Service: Vascular;  Laterality: Right;   AORTOILIAC BYPASS Left 09/09/2022   Procedure: LEFT COMMON ILIAC TO COMMON FEMORAL  BYPASS W/ COMMON ILIAC STENTING;  Surgeon: Leonie , MD;  Location: [facility] OR;  Service: Vascular;  Laterality: Left;   APPLICATION OF WOUND VAC Right 09/21/2021   Procedure: APPLICATION OF WOUND VAC;  Surgeon: Chuck Hint, MD;  Location: [facility] OR;  Service: Vascular;  Laterality: Right;   APPLICATION  OF WOUND VAC Left 08/28/2022   Procedure: APPLICATION OF PREVENA WOUND VAC WITH PLACEMENT OF MYRIAD MORCELLS;  Surgeon: Victorino Sparrow, MD;  Location: [facility] OR;  Service: Vascular;  Laterality: Left;   ARTERY REPAIR Left 08/28/2022   Procedure: LEFT EXTERNAL ILIAC ARTERY REPAIR;  Surgeon: Victorino Sparrow, MD;  Location: [facility] OR;  Service: Vascular;  Laterality: Left;   AV FISTULA PLACEMENT Left 03/28/2020   Procedure: ARTERIOVENOUS (AV) FISTULA CREATION LEFT;  Surgeon: Cephus Shelling, MD;  Location: [facility] OR;  Service: Vascular;  Laterality: Left;   AV FISTULA PLACEMENT Right 09/23/2020   Procedure: RIGHT Arm ARTERIOVENOUS GRAFT CREATION.;  Surgeon: Chuck Hint, MD;  Location: [facility] OR;  Service: Vascular;  Laterality: Right;   BIOPSY  09/15/2022   Procedure: BIOPSY;  Surgeon: Jenel Lucks, MD;  Location: [facility] ENDOSCOPY;  Service: Gastroenterology;;   COLONOSCOPY     COLONOSCOPY N/A 09/15/2022   Procedure: COLONOSCOPY;  Surgeon: Jenel Lucks, MD;  Location: [facility] ENDOSCOPY;  Service: Gastroenterology;  Laterality: N/A;   COLONOSCOPY WITH PROPOFOL N/A 06/27/2020   Procedure: COLONOSCOPY WITH PROPOFOL;  Surgeon: Jeani Hawking, MD;  Location: [facility] ENDOSCOPY;  Service: Endoscopy;  Laterality: N/A;   DRAINAGE AND CLOSURE OF LYMPHOCELE Right 09/21/2021   Procedure: EVACUATION OF LYMPHOCELE RIGHT GROIN;  Surgeon: Chuck Hint, MD;  Location: [facility] OR;  Service: Vascular;  Laterality: Right;   ENDARTERECTOMY FEMORAL Right 09/05/2021   Procedure: ENDARTERECTOMY FEMORAL;  Surgeon: Chuck Hint, MD;  Location: [facility] OR;  Service: Vascular;  Laterality: Right;   FEMORAL ARTERY EXPLORATION Left 09/09/2022   Procedure: LEFT GROIN EXPLORATION;  Surgeon: Leonie , MD;  Location: [facility] OR;  Service: Vascular;  Laterality: Left;   FEMORAL-POPLITEAL BYPASS GRAFT Right 09/05/2021   Procedure: RIGHT FEMORAL-POPLITEAL ARTERY BYPASS WITH Spaphenous Vein;  Surgeon: Chuck Hint, MD;  Location: [facility] OR;  Service: Vascular;  Laterality: Right;   FINGER SURGERY     FLEXIBLE SIGMOIDOSCOPY N/A 09/12/2022   Procedure: FLEXIBLE SIGMOIDOSCOPY;  Surgeon: Imogene Burn, MD;  Location: [facility] ENDOSCOPY;  Service: Gastroenterology;  Laterality: N/A;   FLEXIBLE SIGMOIDOSCOPY N/A 09/17/2022   Procedure: FLEXIBLE SIGMOIDOSCOPY;  Surgeon: Jenel Lucks, MD;  Location: [facility] ENDOSCOPY;  Service: Gastroenterology;  Laterality: N/A;   HEMOSTASIS CLIP PLACEMENT  06/27/2020    Procedure: HEMOSTASIS CLIP PLACEMENT;  Surgeon: Jeani Hawking, MD;  Location: [facility] ENDOSCOPY;  Service: Endoscopy;;   HEMOSTASIS CLIP PLACEMENT  09/15/2022   Procedure: HEMOSTASIS CLIP PLACEMENT;  Surgeon: Jenel Lucks, MD;  Location: [facility] ENDOSCOPY;  Service: Gastroenterology;;   HEMOSTASIS CONTROL  09/17/2022   Procedure: HEMOSTASIS CONTROL;  Surgeon: Jenel Lucks, MD;  Location: [facility] ENDOSCOPY;  Service: Gastroenterology;;  purastat gel   HOT HEMOSTASIS N/A 09/17/2022   Procedure: HOT HEMOSTASIS (ARGON PLASMA COAGULATION/BICAP);  Surgeon: Jenel Lucks, MD;  Location: [facility] ENDOSCOPY;  Service: Gastroenterology;  Laterality: N/A;  gold probe   INSERTION OF DIALYSIS CATHETER Right 06/24/2020   Procedure: INSERTION OF TUNNELED  DIALYSIS CATHETER; Removal of temporary dialysis catheter right neck;  Surgeon: Chuck Hint, MD;  Location: [facility] OR;  Service: Vascular;  Laterality: Right;   INSERTION OF DIALYSIS CATHETER N/A 09/07/2022   Procedure: INSERTION OF DIALYSIS CATHETER;  Surgeon: Victorino Sparrow, MD;  Location: [facility] OR;  Service: Vascular;  Laterality: N/A;   INSERTION OF ILIAC STENT Left 08/22/2022   Procedure: INSERTION OF ILIAC STENT;  Surgeon: Maeola Harman, MD;  Location: [facility] OR;  Service: Vascular;  Laterality: Left;   IR PARACENTESIS  06/30/2022   LIGATION OF ARTERIOVENOUS  FISTULA Left 07/24/2020   Procedure: LIGATION OF LEFT BRACHIOCEPHALIC ARTERIOVENOUS  FISTULA;  Surgeon: Cephus Shelling, MD;  Location: [facility] OR;  Service: Vascular;  Laterality: Left;   PERIPHERAL VASCULAR INTERVENTION Right 06/21/2020   Procedure: PERIPHERAL VASCULAR INTERVENTION;  Surgeon: Chuck Hint, MD;  Location: [facility] INVASIVE CV LAB;  Service: Cardiovascular;  Laterality: Right;   PERIPHERAL VASCULAR INTERVENTION Left 08/10/2022   Procedure: PERIPHERAL VASCULAR INTERVENTION;  Surgeon: Chuck Hint, MD;  Location: [facility] INVASIVE CV LAB;  Service: Cardiovascular;  Laterality:  Left;  External Illiac   POLYPECTOMY  09/15/2022   Procedure: POLYPECTOMY;  Surgeon: Jenel Lucks, MD;  Location: [facility] ENDOSCOPY;  Service: Gastroenterology;;   REVISION OF ARTERIOVENOUS GORETEX GRAFT Right 09/02/2022   Procedure: REVISION OF RIGHT ARTERIOVENOUS GORETEX GRAFT;  Surgeon: Victorino Sparrow, MD;  Location: [facility] OR;  Service: Vascular;  Laterality: Right;   SUBMUCOSAL INJECTION  09/17/2022   Procedure: SUBMUCOSAL INJECTION;  Surgeon: Jenel Lucks, MD;  Location: [facility] ENDOSCOPY;  Service: Gastroenterology;;  3cc epi. injected to rectal ulcer   TEE WITHOUT CARDIOVERSION N/A 05/13/2021   Procedure: TRANSESOPHAGEAL ECHOCARDIOGRAM (TEE);  Surgeon: Little Ishikawa, MD;  Location: [facility] ENDOSCOPY;  Service: Cardiovascular;  Laterality: N/A;   TEE WITHOUT CARDIOVERSION N/A 09/07/2022   Procedure: TRANSESOPHAGEAL ECHOCARDIOGRAM (TEE);  Surgeon: Wendall Stade, MD;  Location: [facility] ENDOSCOPY;  Service: Cardiovascular;  Laterality: N/A;   THROMBECTOMY FEMORAL ARTERY Left 08/22/2022   Procedure: AORTOILIAC THROMBECTOMY, COMMON FEMORAL ENDARTERECTOMY,;  Surgeon: Maeola Harman, MD;  Location: [facility] OR;  Service: Vascular;  Laterality: Left;   ULTRASOUND GUIDANCE FOR VASCULAR ACCESS Right 09/09/2022   Procedure: RIGHT FEMORAL ARTERY ULTRASOUND GUIDANCE FOR VASCULAR ACCESS;  Surgeon: Leonie , MD;  Location: [facility] OR;  Service: Vascular;  Laterality: Right;   VEIN HARVEST Right 09/05/2021   Procedure: RIGHT Saphenous VEIN HARVEST;  Surgeon: Chuck Hint, MD;  Location: [facility] OR;  Service: Vascular;  Laterality: Right;   VEIN HARVEST Left 09/09/2022   Procedure: VEIN HARVEST OF LEFT GREATER SAPHENOUS VEIN;  Surgeon: Leonie , MD;  Location: [facility] OR;  Service: Vascular;  Laterality: Left;    Family History  Problem Relation Age of Onset   Hypertension Mother    Colon cancer Father    Stomach cancer Brother     Social History:  reports that he quit smoking about 3 years  ago. His smoking use included cigarettes. He has a 50.00 pack-year smoking history. He has never used smokeless tobacco. He reports that he does not currently use alcohol after a past usage of about 1.0 - 3.0 standard drink of alcohol per week. He reports that he does not use drugs.  Allergies:  Allergies  Allergen Reactions   Benadryl [Diphenhydramine] Rash   Lidocaine-Prilocaine Rash    Caused red marks up and down arm    Medications: I have reviewed the patient's current medications.  Results for orders placed or performed during the hospital encounter of 08/28/22 (from the past 48 hour(s))  CBC     Status: Abnormal   Collection Time: 09/22/22  8:38 PM  Result Value Ref Range   WBC 20.7 (H) 4.0 - 10.5 K/uL   RBC 3.77 (L) 4.22 - 5.81 MIL/uL   Hemoglobin 11.2 (L) 13.0 - 17.0 g/dL    Comment: REPEATED TO VERIFY POST TRANSFUSION SPECIMEN    HCT 34.9 (L) 39.0 - 52.0 %  MCV 92.6 80.0 - 100.0 fL   MCH 29.7 26.0 - 34.0 pg   MCHC 32.1 30.0 - 36.0 g/dL   RDW 16.1 (H) 09.6 - 04.5 %   Platelets 186 150 - 400 K/uL   nRBC 0.0 0.0 - 0.2 %    Comment: Performed at [facility], [address] [place], [place] [address]  Renal function panel (daily at 0500)     Status: Abnormal   Collection Time: 09/23/22  3:17 AM  Result Value Ref Range   Sodium 135 135 - 145 mmol/L   Potassium 3.9 3.5 - 5.1 mmol/L   Chloride 97 (L) 98 - 111 mmol/L   CO2 27 22 - 32 mmol/L   Glucose, Bld 107 (H) 70 - 99 mg/dL    Comment: Glucose reference range applies only to samples taken after fasting for at least 8 hours.   BUN 36 (H) 8 - 23 mg/dL   Creatinine, Ser 1.91 (H) 0.61 - 1.24 mg/dL   Calcium 8.4 (L) 8.9 - 10.3 mg/dL   Phosphorus 3.9 2.5 - 4.6 mg/dL   Albumin 1.7 (L) 3.5 - 5.0 g/dL   GFR, Estimated 13 (L) >60 mL/min    Comment: (NOTE) Calculated using the CKD-EPI Creatinine Equation (2021)    Anion gap 11 5 - 15    Comment: Performed at [facility], [address], [place], [place]  [address]  CBC     Status: Abnormal   Collection Time: 09/23/22  3:17 AM  Result Value Ref Range   WBC 16.9 (H) 4.0 - 10.5 K/uL   RBC 3.52 (L) 4.22 - 5.81 MIL/uL   Hemoglobin 10.6 (L) 13.0 - 17.0 g/dL   HCT 56.2 (L) 13.0 - 86.5 %   MCV 90.6 80.0 - 100.0 fL   MCH 30.1 26.0 - 34.0 pg   MCHC 33.2 30.0 - 36.0 g/dL   RDW 78.4 (H) 69.6 - 29.5 %   Platelets 188 150 - 400 K/uL   nRBC 0.0 0.0 - 0.2 %    Comment: Performed at [facility], [address], [place], [place] [address]  Prepare RBC (crossmatch)     Status: None   Collection Time: 09/23/22 10:15 PM  Result Value Ref Range   Order Confirmation      ORDER PROCESSED BY BLOOD BANK Performed at [facility], [address] [place], [place] [address]   Prepare RBC (crossmatch)     Status: None   Collection Time: 09/23/22 11:05 PM  Result Value Ref Range   Order Confirmation      ORDER PROCESSED BY BLOOD BANK Performed at [facility], [address] [place], [place] [address]   Glucose, capillary     Status: Abnormal   Collection Time: 09/24/22 12:35 AM  Result Value Ref Range   Glucose-Capillary 114 (H) 70 - 99 mg/dL    Comment: Glucose reference range applies only to samples taken after fasting for at least 8 hours.  Prepare RBC (crossmatch)     Status: None   Collection Time: 09/24/22 12:51 AM  Result Value Ref Range   Order Confirmation      ORDER PROCESSED BY BLOOD BANK Performed at [facility], [address] [place], [place] [address]   Prepare platelet pheresis     Status: None (Preliminary result)   Collection Time: 09/24/22 12:52 AM  Result Value Ref Range   Unit Number [id number]    Blood Component  Type PLTP2 PSORALEN TREATED    Unit division 00    Status of Unit ISSUED    Transfusion Status      OK TO TRANSFUSE Performed at [facility], [address], [place], [place] [address]    Unit Number [id number]    Blood Component Type PLTP1 PSORALEN TREATED    Unit division  00    Status of Unit ISSUED    Transfusion Status OK TO TRANSFUSE   Prepare fresh frozen plasma     Status: None (Preliminary result)   Collection Time: 09/24/22 12:52 AM  Result Value Ref Range   Unit Number [id number]    Blood Component Type THW PLS APHR    Unit division B0    Status of Unit ISSUED    Transfusion Status      OK TO TRANSFUSE Performed at [facility], [address], [place], [place] [address]    Unit Number [id number]    Blood Component Type THW PLS APHR    Unit division B0    Status of Unit ISSUED    Transfusion Status OK TO TRANSFUSE   Prepare cryoprecipitate     Status: None (Preliminary result)   Collection Time: 09/24/22 12:52 AM  Result Value Ref Range   Unit Number [id number]    Blood Component Type POOL FIBR CMPLX 2D THW    Unit division 00    Status of Unit ISSUED    Transfusion Status OK TO TRANSFUSE    Unit Number [id number]    Blood Component Type POOL FIBR CMPLX 2D THW    Unit division 00    Status of Unit ISSUED    Transfusion Status      OK TO TRANSFUSE Performed at [facility], [address], [place], [place] [address]   I-STAT 7, (LYTES, BLD GAS, ICA, H+H)     Status: Abnormal   Collection Time: 09/24/22  3:18 AM  Result Value Ref Range   pH, Arterial 7.436 7.35 - 7.45   pCO2 arterial 37.7 32 - 48 mmHg   pO2, Arterial 170 (H) 83 - 108 mmHg   Bicarbonate 25.5 20.0 - 28.0 mmol/L   TCO2 27 22 - 32 mmol/L   O2 Saturation 100 %   Acid-Base Excess 1.0 0.0 - 2.0 mmol/L   Sodium 135 135 - 145 mmol/L   Potassium 4.0 3.5 - 5.1 mmol/L   Calcium, Ion 1.12 (L) 1.15 - 1.40 mmol/L   HCT 33.0 (L) 39.0 - 52.0 %   Hemoglobin 11.2 (L) 13.0 - 17.0 g/dL   Patient temperature 96.7 F    Collection site RADIAL, ALLEN'S TEST ACCEPTABLE    Drawn by RT    Sample type ARTERIAL   CBC     Status: Abnormal   Collection Time: 09/24/22  4:00 AM  Result Value Ref Range   WBC 21.0 (H) 4.0 - 10.5 K/uL   RBC 3.80 (L) 4.22 - 5.81  MIL/uL   Hemoglobin 11.6 (L) 13.0 - 17.0 g/dL   HCT 89.3 (L) 81.0 - 17.5 %   MCV 90.0 80.0 - 100.0 fL   MCH 30.5 26.0 - 34.0 pg   MCHC 33.9 30.0 - 36.0 g/dL   RDW 10.2 (H) 58.5 - 27.7 %   Platelets 179 150 - 400 K/uL   nRBC 0.0 0.0 - 0.2 %    Comment: Performed at  [facility], [address], [place], [place] [address]  Comprehensive metabolic panel  Status: Abnormal   Collection Time: 09/24/22  4:00 AM  Result Value Ref Range   Sodium 136 135 - 145 mmol/L   Potassium 4.3 3.5 - 5.1 mmol/L   Chloride 97 (L) 98 - 111 mmol/L   CO2 25 22 - 32 mmol/L   Glucose, Bld 121 (H) 70 - 99 mg/dL    Comment: Glucose reference range applies only to samples taken after fasting for at least 8 hours.   BUN 54 (H) 8 - 23 mg/dL   Creatinine, Ser 1.61 (H) 0.61 - 1.24 mg/dL   Calcium 7.9 (L) 8.9 - 10.3 mg/dL   Total Protein 4.5 (L) 6.5 - 8.1 g/dL   Albumin 1.7 (L) 3.5 - 5.0 g/dL   AST 19 15 - 41 U/L   ALT 11 0 - 44 U/L   Alkaline Phosphatase 92 38 - 126 U/L   Total Bilirubin 1.6 (H) 0.3 - 1.2 mg/dL   GFR, Estimated 10 (L) >60 mL/min    Comment: (NOTE) Calculated using the CKD-EPI Creatinine Equation (2021)    Anion gap 14 5 - 15    Comment: Performed at [facility], [address] [place], [place] [address]  Magnesium     Status: None   Collection Time: 09/24/22  4:00 AM  Result Value Ref Range   Magnesium 1.9 1.7 - 2.4 mg/dL    Comment: Performed at [facility], [address] [place], [place] [address]  Phosphorus     Status: Abnormal   Collection Time: 09/24/22  4:00 AM  Result Value Ref Range   Phosphorus 5.7 (H) 2.5 - 4.6 mg/dL    Comment: Performed at [facility], [address], [place], [place] [address]  Glucose, capillary     Status: Abnormal   Collection Time: 09/24/22  4:19 AM  Result Value Ref Range   Glucose-Capillary 113 (H) 70 - 99 mg/dL    Comment: Glucose reference range applies only to samples taken after fasting for at least 8 hours.   Glucose, capillary     Status: None   Collection Time: 09/24/22  8:12 AM  Result Value Ref Range   Glucose-Capillary 82 70 - 99 mg/dL    Comment: Glucose reference range applies only to samples taken after fasting for at least 8 hours.    DG Chest Port 1 View  Result Date: 09/24/2022 CLINICAL DATA:  The central line placement EXAM: PORTABLE CHEST 1 VIEW COMPARISON:  09/24/2022 FINDINGS: The heart size and mediastinal contours are within normal limits. Both lungs are clear. The visualized skeletal structures are unremarkable. Interval placement of left IJ approach central venous catheter with tip in the lower SVC. The remainder of the support apparatus is unchanged. IMPRESSION: Interval placement of left IJ approach central venous catheter with tip in the lower SVC. No pneumothorax. Electronically Signed   By: Deatra Robinson M.D.   On: 09/24/2022 03:22   Portable Chest x-ray  Result Date: 09/24/2022 CLINICAL DATA:  ETT EXAM: PORTABLE CHEST 1 VIEW COMPARISON:  09/13/2022 FINDINGS: Endotracheal tube terminates 5 cm above the carina. Lungs are essentially clear.  No pleural effusion or pneumothorax. Mild cardiomegaly. Right IJ catheter terminates at the cavoatrial junction. IMPRESSION: Endotracheal tube terminates 5 cm above the carina. Electronically Signed   By: Charline Bills M.D.   On: 09/24/2022 01:42    Review of Systems  Unable to perform ROS: Intubated   Blood pressure (!) 95/54, pulse (!) 51, temperature (!) 97.5 F (36.4  C), temperature source Axillary, resp. rate 16, height 5\' 8"  (1.727 m), weight 67.6 kg, SpO2 100 %. Physical Exam Constitutional:      Comments: Sedated on ventilator   Abdomen is soft.  There is a VAC in place to his left groin. Overall, he appears acutely ill Rectal exam was deferred  Assessment/Plan: Rectal ulcers with bleeding  This remains a very difficult situation.  I had a discussion with the patient's daughter as well as with vascular surgery.  He  likely has no collaterals that would allow interventional radiology to reach this area to embolize any vessels. He is not currently bleeding so I do not believe he needs to go to the operating right now because of finding the source of the ulcers may be difficult without seeing active bleeding.  Sutures in this area could also great necrosis as well. I recommend that if he has another episode of brisk bleeding that we would then go to the operating room for an examination under anesthesia to see if anything could be cauterized or sutured.  I am worried this will continue to be an ongoing issue given his overall medical condition and again, we could create further rectal ulcerating.  He would not tolerate any further extensive surgery like a resection. I explained this in detail to the patient's daughter and vascular surgery also explained this as well and she fully understands and agrees with the current plans.  Again, he is not currently actively bleeding so we will hold on any surgery unless again bleeding resumes  Complex medical decision making  Abigail Miyamoto 09/24/2022, 12:04 PM

## 2022-09-24 NOTE — Progress Notes (Signed)
eLink Physician-Brief Progress Note Patient Name: YOSSEF GILKISON DOB: 01/30/1956 MRN: [id number]   Date of Service  09/24/2022  HPI/Events of Note  67 year old male initially admitted for ischemic left lower extremity.  He is status post revascularization of his left leg.  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 Developed rectal bleeding and was sent for sigmoidoscopy where he was found to have a bleeding rectal ulcer.  Hemostasis secured.  Now in ICU intubated, with hemorrhagic shock, acute blood loss anemia status posttransfusion of 4 PRBC, 2 platelets and 2 plasma, on norepinephrine for hemodynamic support.  eICU Interventions  Will continue mechanical ventilatory support.  He will be assessed for extubation He is hemodynamically stable and volume status has been optimized. Wean off pressors as tolerated. He will likely need CRRT again in light of all the products of volume that he has received without making much urine. Prognosis is poor. Plan of care discussed with bedside nurse.Re        Carilyn Goodpasture 09/24/2022, 5:27 AM

## 2022-09-25 DIAGNOSIS — B9562 Methicillin resistant Staphylococcus aureus infection as the cause of diseases classified elsewhere: Secondary | ICD-10-CM | POA: Diagnosis not present

## 2022-09-25 DIAGNOSIS — K922 Gastrointestinal hemorrhage, unspecified: Secondary | ICD-10-CM | POA: Insufficient documentation

## 2022-09-25 DIAGNOSIS — D62 Acute posthemorrhagic anemia: Secondary | ICD-10-CM | POA: Diagnosis not present

## 2022-09-25 DIAGNOSIS — R7881 Bacteremia: Secondary | ICD-10-CM

## 2022-09-25 DIAGNOSIS — K626 Ulcer of anus and rectum: Secondary | ICD-10-CM | POA: Diagnosis not present

## 2022-09-25 LAB — GLUCOSE, CAPILLARY
Glucose-Capillary: 82 mg/dL (ref 70–99)
Glucose-Capillary: 79 mg/dL (ref 70–99)
Glucose-Capillary: 92 mg/dL (ref 70–99)
Glucose-Capillary: 112 mg/dL — ABNORMAL HIGH (ref 70–99)

## 2022-09-25 LAB — BPAM PLATELET PHERESIS
Blood Product Expiration Date: 202404192359
ISSUE DATE / TIME: 202404180059
ISSUE DATE / TIME: 202404180059
Unit Type and Rh: 5100
Unit Type and Rh: 7300

## 2022-09-25 LAB — RENAL FUNCTION PANEL
Albumin: 1.6 g/dL — ABNORMAL LOW (ref 3.5–5.0)
Anion gap: 12 (ref 5–15)
BUN: 28 mg/dL — ABNORMAL HIGH (ref 8–23)
CO2: 28 mmol/L (ref 22–32)
Calcium: 7.8 mg/dL — ABNORMAL LOW (ref 8.9–10.3)
Chloride: 96 mmol/L — ABNORMAL LOW (ref 98–111)
Creatinine, Ser: 3.97 mg/dL — ABNORMAL HIGH (ref 0.61–1.24)
GFR, Estimated: 16 mL/min — ABNORMAL LOW (ref >60)
Glucose, Bld: 82 mg/dL (ref 70–99)
Phosphorus: 3.5 mg/dL (ref 2.5–4.6)
Potassium: 3.4 mmol/L — ABNORMAL LOW (ref 3.5–5.1)
Sodium: 136 mmol/L (ref 135–145)

## 2022-09-25 LAB — TYPE AND SCREEN
Unit division: 0
Unit division: 0

## 2022-09-25 LAB — BPAM FFP
ISSUE DATE / TIME: 202404180058
ISSUE DATE / TIME: 202404180058
Unit Type and Rh: 1700
Unit Type and Rh: 7300

## 2022-09-25 LAB — CBC
HCT: 29.9 % — ABNORMAL LOW (ref 39.0–52.0)
Hemoglobin: 10.1 g/dL — ABNORMAL LOW (ref 13.0–17.0)
MCH: 30.2 pg (ref 26.0–34.0)
MCHC: 33.8 g/dL (ref 30.0–36.0)
MCV: 89.5 fL (ref 80.0–100.0)
Platelets: 119 10*3/uL — ABNORMAL LOW (ref 150–400)
RBC: 3.34 MIL/uL — ABNORMAL LOW (ref 4.22–5.81)
RDW: 18.6 % — ABNORMAL HIGH (ref 11.5–15.5)
WBC: 14.3 10*3/uL — ABNORMAL HIGH (ref 4.0–10.5)
nRBC: 0 % (ref 0.0–0.2)

## 2022-09-25 LAB — BPAM CRYOPRECIPITATE: Unit Type and Rh: 6200

## 2022-09-25 LAB — BPAM RBC
Blood Product Expiration Date: 202405162359
Unit Type and Rh: 5100
Unit Type and Rh: 5100
Unit Type and Rh: 5100
Unit Type and Rh: 5100

## 2022-09-25 LAB — PREPARE PLATELET PHERESIS: Unit division: 0

## 2022-09-25 LAB — TRIGLYCERIDES: Triglycerides: 132 mg/dL (ref <150)

## 2022-09-25 LAB — HEMOGLOBIN AND HEMATOCRIT, BLOOD
HCT: 29.2 % — ABNORMAL LOW (ref 39.0–52.0)
Hemoglobin: 9.9 g/dL — ABNORMAL LOW (ref 13.0–17.0)

## 2022-09-25 LAB — PREPARE CRYOPRECIPITATE: Unit division: 0

## 2022-09-25 LAB — PREPARE FRESH FROZEN PLASMA

## 2022-09-25 MED ORDER — ROSUVASTATIN CALCIUM 5 MG PO TABS
10.0000 mg | ORAL_TABLET | Freq: Every day | ORAL | Status: DC
  Administered 2022-09-25 – 2022-10-22 (×26): 10 mg via ORAL
  Filled 2022-09-25 (×26): qty 2

## 2022-09-25 MED ORDER — RENA-VITE PO TABS
1.0000 | ORAL_TABLET | Freq: Every day | ORAL | Status: DC
  Administered 2022-09-25 – 2022-10-21 (×26): 1 via ORAL
  Filled 2022-09-25 (×28): qty 1

## 2022-09-25 MED ORDER — JUVEN PO PACK: 1.0000 | PACK | Freq: Two times a day (BID) | ORAL | Status: DC

## 2022-09-25 MED ORDER — OXYCODONE HCL 5 MG PO TABS
5.0000 mg | ORAL_TABLET | Freq: Four times a day (QID) | ORAL | Status: DC | PRN
  Administered 2022-09-25 – 2022-09-28 (×10): 5 mg via ORAL
  Filled 2022-09-25 (×11): qty 1

## 2022-09-25 MED ORDER — METHOCARBAMOL 500 MG PO TABS
500.0000 mg | ORAL_TABLET | Freq: Three times a day (TID) | ORAL | Status: DC
  Administered 2022-09-25 – 2022-10-22 (×77): 500 mg via ORAL
  Filled 2022-09-25 (×79): qty 1

## 2022-09-25 MED ORDER — PROSOURCE PLUS PO LIQD
30.0000 mL | Freq: Three times a day (TID) | ORAL | Status: DC
  Administered 2022-09-25 – 2022-10-22 (×55): 30 mL via ORAL
  Filled 2022-09-25 (×56): qty 30

## 2022-09-25 MED ORDER — JUVEN PO PACK
1.0000 | PACK | Freq: Two times a day (BID) | ORAL | Status: DC
  Administered 2022-09-28 – 2022-10-21 (×36): 1 via ORAL
  Filled 2022-09-25 (×34): qty 1

## 2022-09-25 MED ORDER — VITAMIN C 500 MG PO TABS
250.0000 mg | ORAL_TABLET | Freq: Two times a day (BID) | ORAL | Status: DC
  Administered 2022-09-25 – 2022-10-22 (×50): 250 mg via ORAL
  Filled 2022-09-25 (×52): qty 1

## 2022-09-25 MED ORDER — ACETAMINOPHEN 325 MG PO TABS
650.0000 mg | ORAL_TABLET | Freq: Four times a day (QID) | ORAL | Status: DC
  Administered 2022-09-25 – 2022-10-22 (×95): 650 mg via ORAL
  Filled 2022-09-25 (×97): qty 2

## 2022-09-25 MED ORDER — POTASSIUM CHLORIDE CRYS ER 20 MEQ PO TBCR
20.0000 meq | EXTENDED_RELEASE_TABLET | Freq: Once | ORAL | Status: AC
  Administered 2022-09-25: 20 meq via ORAL
  Filled 2022-09-25: qty 1

## 2022-09-25 MED ORDER — VITAMIN A 3 MG (10000 UNIT) PO CAPS
10000.0000 [IU] | ORAL_CAPSULE | Freq: Every day | ORAL | Status: DC
  Administered 2022-09-25 – 2022-10-21 (×25): 10000 [IU] via ORAL
  Filled 2022-09-25 (×28): qty 1

## 2022-09-25 MED ORDER — BOOST / RESOURCE BREEZE PO LIQD CUSTOM
1.0000 | Freq: Three times a day (TID) | ORAL | Status: DC
  Administered 2022-09-27 – 2022-09-28 (×3): 1 via ORAL

## 2022-09-25 MED ORDER — PANTOPRAZOLE SODIUM 40 MG PO TBEC
40.0000 mg | DELAYED_RELEASE_TABLET | Freq: Two times a day (BID) | ORAL | Status: DC
  Administered 2022-09-25 – 2022-10-22 (×51): 40 mg via ORAL
  Filled 2022-09-25 (×52): qty 1

## 2022-09-25 MED ORDER — BUDESONIDE 3 MG PO CPEP
9.0000 mg | ORAL_CAPSULE | Freq: Every day | ORAL | Status: DC
  Administered 2022-09-25 – 2022-10-21 (×25): 9 mg via ORAL
  Filled 2022-09-25 (×28): qty 3

## 2022-09-25 MED ORDER — HYDRALAZINE HCL 20 MG/ML IJ SOLN
10.0000 mg | INTRAMUSCULAR | Status: DC | PRN
  Administered 2022-09-25: 10 mg via INTRAVENOUS
  Filled 2022-09-25: qty 1

## 2022-09-25 MED ORDER — CALCIUM ACETATE (PHOS BINDER) 667 MG PO CAPS
1334.0000 mg | ORAL_CAPSULE | Freq: Three times a day (TID) | ORAL | Status: DC
  Administered 2022-09-25 – 2022-10-13 (×33): 1334 mg via ORAL
  Filled 2022-09-25 (×38): qty 2

## 2022-09-25 MED ORDER — LABETALOL HCL 5 MG/ML IV SOLN
10.0000 mg | INTRAVENOUS | Status: DC | PRN
  Filled 2022-09-25: qty 4

## 2022-09-25 NOTE — Progress Notes (Signed)
Nutrition Follow-up  DOCUMENTATION CODES:   Non-severe (moderate) malnutrition in context of chronic illness  INTERVENTION:   Liberalize diet back to REGULAR with Fluid Restriction  Continue 30 ml ProSource Plus TID, each supplement provides 100 kcals and 15 grams protein.   Trial of Juven BID for wound healing, each packet provides 80 calories, 8 grams of carbohydrate, 2.5  grams of protein (collagen), 7 grams of L-arginine and 7 grams of L-glutamine; supplement contains CaHMB, Vitamins C, E, B12 and Zinc  Boost Breeze po TID, each supplement provides 250 kcal and 9 grams of protein  Add Vitamin C 250 mg BID and Vitamin A 10,000 units daily x 30 days for deficiency. Conservative management initially due to ESRD  Continue Renal MVI  NUTRITION DIAGNOSIS:   Moderate Malnutrition (in the context of chronic illness (ESRD)) related to poor appetite as evidenced by moderate fat depletion, severe muscle depletion.  Being addressed via diet liberalization, supplements, etc  GOAL:   Patient will meet greater than or equal to 90% of their needs  Progressing  MONITOR:   PO intake, Supplement acceptance, I & O's, Labs, Weight trends  REASON FOR ASSESSMENT:   Consult, Ventilator Enteral/tube feeding initiation and management (trickle tube feeds)  ASSESSMENT:   67 y.o. male admitted with abdominal pain, nausea, diaphoresis and unable to feel L foot d/t L femoral pseudoaneurysm. Pt was hypotensive with lactic acidosis upon admission. PMH significant for ESRD, PAD, COPD, HLD, HTN, HFrEF 35-40%.  3/22 - admit, emergent CRRT 3/23 - L fem artery repair 3/24 - extubated 3/26 - CRRT off 3/27 - Right arm AV loop graft embolectomy attempted but aborted for hemodynamic instability  3/29 - MRI shows right subinsular 4mm infarct 4/01 IR placed new [facility] 4/03 OR for infected pseudoaneurysm of L femoral patch angioplasty, wound VAC 4/06 Flex Sig with 5 non-bleeding ulcers 4/07 CTA negative  for acute bleeding 4/09 Colonoscopy with 2 large ulcers in rectum 4/11 Repeat Flex Sig 4/18 Urgent Flex Sig with briskly bleeding distal rectal ulcer treated with Purastat gel  Pt has experienced 4 endoscopic procedures for bleeding. Noted if he re-bleeds surgical intervention likely needed Noted Pathology +Crohn's  Pt ate eggs and toast for breakfast this AM. Appetite fairly good currently but pt has been NPO again for bleeding, procedures, intubation. Daughter believes he was eating better prior to most recent bleeding. Oral nutrition supplements had been discontinued but plan to reorder today  Wound VAC to groin wound. Pt complaining of pain in LE and sacrum +hydrotherapy for sacral wound, sig worsening of wound.   Discussed trial of wound specific supplement with pt and his daughter, [name]. Both are in agreement to try this. Pt does not like the milk supplements, will also continue Pro-Source TID and continue Boost Breeze for now  Continues with iHD, next HD on 4/20  Micronutrient Labs (09/15/22):  CRP: 10 (H) Vitamin A: 9.2 (L) Vitamin C: 0.2 (L) Zinc: 64 (wdl)  Labs: phosphorus 3.5 (wdl) Meds: aranesp, bentyl, mesalamine suppostiroy, Rean-Vite   Diet Order:  RENAL with 1200 mL FLUID RESTRICTION   EDUCATION NEEDS:   Education needs have been addressed  Skin:  Skin Assessment: Skin Integrity Issues: Skin Integrity Issues:: Unstageable, Other (Comment) DTI: R/L heel Stage II: n/a Unstageable: sacrum (DTI that evlolved into unstageable)-hydrotherapy initiated Wound Vac: L groin Incisions: L groin (closed) Other: Buttocks and gluteal crease/upper thighs with scattered areas of partial thickness and full thickness skin loss  Last BM:  4/11  Height:  Ht Readings from Last 1 Encounters:  09/24/22  (1.727 m)    Weight:   Wt Readings from Last 1 Encounters:  09/25/22 68.5 kg    Ideal Body Weight:  70 kg  BMI:  Body mass index is 22.96 kg/m.  Estimated  Nutritional Needs:   Kcal:  1900-2100  Protein:  95-110g/d  Fluid:  1L + UOP   Romelle Starcher MS, RDN, LDN, CNSC Registered Dietitian 3 Clinical Nutrition RD Pager and On-Call Pager Number Located in [place]  '

## 2022-09-25 NOTE — Progress Notes (Addendum)
eLink Physician-Brief Progress Note Patient Name: Albert Harris DOB: 1955-12-14 MRN: [id number]   Date of Service  09/25/2022  HPI/Events of Note  67 year old male with recurrent lower GI bleeding that has been endoscopically examined on multiple occasions and attempted repair.  He keeps being transferred back to the ICU for management.  Called to evaluate the patient is hypertension and large bloody bowel movement.  Limited evaluation given that this is not a camera room.  Hemoglobin has been stable.  Ongoing trend  Notes reviewed-surgery believes that this will likely need to be a surgical intervention if there is ongoing bleeding  eICU Interventions  Hydralazine for SBP greater than 160  Calling [facility] [place] [facility] for the update.  N.p.o. after midnight   1127 -I discussed the case with on-call surgery, they will reevaluate the case-currently in the operating room.  Has persistent hypertension with maps 96, minimal response to hydralazine.  Attempt labetalol instead.  Intervention Category Intermediate Interventions: Bleeding - evaluation and treatment with blood products  Albert Harris 09/25/2022, 9:10 PM

## 2022-09-25 NOTE — Progress Notes (Addendum)
[facility] Progress Note   Subjective:    Seen and examined patient at bedside. Patient now extubated and on RA. Pressors now off. He reports feeling better. Denies SOB, CP, and N/V. Unable to reach max UF yesterday d/y hypotension (1L removed). Next HD 4/20.   Objective Vitals:   09/25/22 0400 09/25/22 0500 09/25/22 0600 09/25/22 0751  BP: 121/60 (!) 149/66 (!) 155/63   Pulse: 73 74 77   Resp: Temp:    98.5 F (36.9 C)  TempSrc:    Oral  SpO2: 93% 97% 96%   Weight:  68.5 kg    Height:       Physical Exam General: Extubated and off pressors. Now on RA, NAD Heart: RRR; no murmur Lungs: CTA anteriorly Abdomen: soft Extremities: Trace LE edema; L foot with ischemic distal 3rd-5th toes Dialysis Access: [facility]  Filed Weights   09/24/22 1504 09/24/22 1852 09/25/22 0500  Weight: 67.6 kg 66.5 kg 68.5 kg    Intake/Output Summary (Last 24 hours) at 09/25/2022 0945 Last data filed at 09/24/2022 2000 Gross per 24 hour  Intake 168.5 ml  Output 1000 ml  Net -831.5 ml    Additional Objective Labs: Basic Metabolic Panel: Recent Labs  Lab 09/23/22 0317 09/24/22 0318 09/24/22 0400 09/25/22 0509  NA 135 135 136 136  K 3.9 4.0 4.3 3.4*  CL 97*  --  97* 96*  CO2 27  --  25 28  GLUCOSE 107*  --  121* 82  BUN 36*  --  54* 28*  CREATININE 4.59*  --  5.69* 3.97*  CALCIUM 8.4*  --  7.9* 7.8*  PHOS 3.9  --  5.7* 3.5   Liver Function Tests: Recent Labs  Lab 09/23/22 0317 09/24/22 0400 09/25/22 0509  AST  --  19  --   ALT  --  11  --   ALKPHOS  --  92  --   BILITOT  --  1.6*  --   PROT  --  4.5*  --   ALBUMIN 1.7* 1.7* 1.6*   No results for input(s): "LIPASE", "AMYLASE" in the last 168 hours. CBC: Recent Labs  Lab 09/22/22 0433 09/22/22 2038 09/23/22 0317 09/24/22 0318 09/24/22 0400 09/25/22 0509  WBC 21.9* 20.7* 16.9*  --  21.0* 14.3*  HGB 7.0* 11.2* 10.6* 11.2* 11.6* 10.1*  HCT 21.8* 34.9* 31.9* 33.0* 34.2* 29.9*  MCV 97.8 92.6 90.6   --  90.0 89.5  PLT 263 186 188  --  179 119*   Blood Culture    Component Value Date/Time   SDES TISSUE 09/09/2022 1453   SPECREQUEST VESSEL PT ON VANC ANCEF 09/09/2022 1453   CULT  09/09/2022 1453    RARE STAPHYLOCOCCUS AUREUS SUSCEPTIBILITIES PERFORMED ON PREVIOUS CULTURE WITHIN THE LAST 5 DAYS. NO ANAEROBES ISOLATED Performed at [facility], [address], [place], [place] [address]    REPTSTATUS 09/14/2022 FINAL 09/09/2022 1453    Cardiac Enzymes: Recent Labs  Lab 09/22/22 0331  CKTOTAL 26*   CBG: Recent Labs  Lab 09/24/22 1555 09/24/22 1923 09/24/22 2322 09/25/22 0335 09/25/22 0749  GLUCAP 86 85 87 82 79   Iron Studies: No results for input(s): "IRON", "TIBC", "TRANSFERRIN", "FERRITIN" in the last 72 hours. Lab Results  Component Value Date   INR 1.2 08/12/2022   INR 1.3 (H) 06/29/2022   INR 1.3 (H) 06/28/2022   Studies/Results: DG Chest Port 1 View  Result Date: 09/24/2022 CLINICAL DATA:  The central line placement EXAM: PORTABLE CHEST 1 VIEW COMPARISON:  09/24/2022 FINDINGS: The heart size and mediastinal contours are within normal limits. Both lungs are clear. The visualized skeletal structures are unremarkable. Interval placement of left IJ approach central venous catheter with tip in the lower SVC. The remainder of the support apparatus is unchanged. IMPRESSION: Interval placement of left IJ approach central venous catheter with tip in the lower SVC. No pneumothorax. Electronically Signed   By: Deatra Robinson M.D.   On: 09/24/2022 03:22   Portable Chest x-ray  Result Date: 09/24/2022 CLINICAL DATA:  ETT EXAM: PORTABLE CHEST 1 VIEW COMPARISON:  09/13/2022 FINDINGS: Endotracheal tube terminates 5 cm above the carina. Lungs are essentially clear.  No pleural effusion or pneumothorax. Mild cardiomegaly. Right IJ catheter terminates at the cavoatrial junction. IMPRESSION: Endotracheal tube terminates 5 cm above the carina. Electronically Signed   By: Charline Bills M.D.   On: 09/24/2022 01:42    Medications:  sodium chloride Stopped (09/22/22 0757)   DAPTOmycin (CUBICIN) 600 mg in sodium chloride 0.9 % IVPB Stopped (09/24/22 1759)   [START ON 09/26/2022] DAPTOmycin (CUBICIN) 750 mg in sodium chloride 0.9 % IVPB     sodium chloride      acetaminophen  650 mg Oral Q6H   budesonide  9 mg Oral Daily   calcium acetate  1,334 mg Oral TID WC   Chlorhexidine Gluconate Cloth  6 each Topical Q0600   [START ON 10/01/2022] collagenase   Topical Daily   darbepoetin (ARANESP) injection - DIALYSIS  200 mcg Subcutaneous Q Wed-1800   liver oil-zinc oxide   Topical TID   mesalamine  1,000 mg Rectal BID   methocarbamol  500 mg Oral TID   multivitamin  1 tablet Oral QHS   pantoprazole (PROTONIX) IV  40 mg Intravenous Q12H   rosuvastatin  10 mg Oral Daily   sodium chloride flush  10-40 mL Intracatheter Q12H   sodium hypochlorite   Irrigation BID    Dialysis Orders: TTS SW  4h   350/800  64.5kg  RFA AVG -> clotted/ new TDC in place Heparin none  - last OP HD 3/14, post wt 65.9kg - venofer 50mg  IV weekly - rocaltrol 0.25 mcg po tiw - phoslo 2 ac tid - no esa, last Hb 11.0  Assessment/Plan:  Recurrent lower GIB: S/p colonoscopy 09/15/22, then repeat sigmoidoscopy 4/11. Several bleeding ulcers addressed with endoscopy.  Path came back + Crohn's - started on mesalamine by GI. Brisk re-bleed 4/18, back for emergent sigmoidoscopy with hemostatic gel applied to bleeding ulcer. Now in ICU. Left common femoral artery pseudoaneurysm - s/p L femoral exploration with pseudoaneurysm repair 08/29/22, then iliofemoral bypass, removal of infected patch with sartorius muscle flap on 09/09/22. Ischemic changes to L 3rd-5th toes - still high risk of limb loss. Plan pending at this time.   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  CVA: + stroke on MRI. Neuro signed off.   ESRD: Continue HD on TTS schedule, did require short-term CRRT 3/23- 3/26. Next HD 4/20. BP/volume: Variable, trace edema on exam. Anemia (ESRD + ABLA): Last transfused 4U on 4/18 (that brings total to 23U this admit). Also on Aranesp q Wed (max dose).  Secondary HPTH: CorrCa high, Phos ok. Holding VDRA for now. Will work to get him off  Phoslo as outpatient - can continue for now. Dialysis access: AVG clotted 3/27,  went for declot by VVS and then re-clotted. VVS placed R TDC on 4/1.  HFrEF (EF 35-40%)  Salome Holmes, NP [facility] 09/25/2022,9:45 AM  LOS: 28 days

## 2022-09-25 NOTE — Progress Notes (Signed)
Notified E-link concerning patient's recurrence large amount bloody stool. B/p 180-190s systolic.   Orders given.

## 2022-09-25 NOTE — Progress Notes (Signed)
Physical Therapy Wound Treatment Patient Details  Name: Albert Harris MRN: [id number] Date of Birth: Sep 13, 1955  Today's Date: 09/25/2022 Time: 7829-5621 Time Calculation (min): 57 min  Subjective  Subjective Assessment Subjective: Pt pleasant and agreeable to wound therapy Patient and Family Stated Goals: Heal wound Date of Onset:  (unknown) Prior Treatments: Dressing changes  Pain Score:  Pt premedicated and tolerated treatment without complaints of pain.   Wound Assessment  Pressure Injury 08/28/22 Sacrum Medial Unstageable - Full thickness tissue loss in which the base of the injury is covered by slough (yellow, tan, gray, green or brown) and/or eschar (tan, brown or black) in the wound bed. Sacral wound with eschar coveri (Active)  Dressing Type Foam - Lift dressing to assess site every shift;Gauze (Comment);Dakin's-soaked gauze;Barrier Film (skin prep);Moist to moist 09/25/22 1508  Dressing Changed;Clean, Dry, Intact 09/25/22 1508  Dressing Change Frequency Twice a day 09/25/22 1508  State of Healing Non-healing 09/25/22 1508  Site / Wound Assessment Black;Brown;Red;Yellow 09/25/22 1508  % Wound base Red or Granulating 40% 09/25/22 1508  % Wound base Yellow/Fibrinous Exudate 35% 09/25/22 1508  % Wound base Black/Eschar 20% 09/25/22 1508  % Wound base Other/Granulation Tissue (Comment) 5% 09/25/22 1508  Peri-wound Assessment Erythema (non-blanchable) 09/25/22 1508  Wound Length (cm) 9.7 cm 09/22/22 1511  Wound Width (cm) 10.1 cm 09/22/22 1511  Wound Depth (cm) 1.4 cm 09/22/22 1511  Wound Surface Area (cm^2) 97.97 cm^2 09/22/22 1511  Wound Volume (cm^3) 137.16 cm^3 09/22/22 1511  Tunneling (cm) 0 09/14/22 1344  Undermining (cm) 1:00 0.8 09/22/22 1511  Margins Unattached edges (unapproximated) 09/25/22 1508  Drainage Amount Copious 09/25/22 1508  Drainage Description Purulent 09/25/22 1508  Treatment Debridement (Selective);Irrigation;Other (Comment) 09/25/22 1508       Selective Debridement (non-excisional) Selective Debridement (non-excisional) - Location: Sacrum Selective Debridement (non-excisional) - Tools Used: Forceps, Scalpel, Scissors Selective Debridement (non-excisional) - Tissue Removed: yellow and black nonviable tissue    Wound Assessment and Plan  Wound Therapy - Assess/Plan/Recommendations Wound Therapy - Clinical Statement: Progressing with debridement.  A large abscessed area opened up from 2:00-5:00 and was about 4 cm in depth. A copious amount of foul smelling purulence exited the area once opened up. RN present to witness. This patient will benefit from continued wound therapy for selective removal of unviable tissue, to decrease bioburden, and promote wound bed healing. Wound Therapy - Functional Problem List: Decreased tolerance for repositioning and OOB due to wound; global weakness in the setting of extended hospitalization and now ICU stay. Factors Delaying/Impairing Wound Healing: Immobility, Multiple medical problems, Polypharmacy Hydrotherapy Plan: Debridement, Dressing change, Patient/family education Wound Therapy - Frequency: 2X / week Wound Therapy - Follow Up Recommendations: dressing changes by RN  Wound Therapy Goals- Improve the function of patient's integumentary system by progressing the wound(s) through the phases of wound healing (inflammation - proliferation - remodeling) by: Wound Therapy Goals - Improve the function of patient's integumentary system by progressing the wound(s) through the phases of wound healing by: Decrease Necrotic Tissue to: 20% Decrease Necrotic Tissue - Progress: Progressing toward goal Increase Granulation Tissue to: 80% Increase Granulation Tissue - Progress: Progressing toward goal Goals/treatment plan/discharge plan were made with and agreed upon by patient/family: Yes Time For Goal Achievement: 7 days Wound Therapy - Potential for Goals: Good  Goals will be updated until maximal  potential achieved or discharge criteria met.  Discharge criteria: when goals achieved, discharge from hospital, MD decision/surgical intervention, no progress towards goals, refusal/missing three consecutive treatments without  notification or medical reason.  GP     Charges PT Wound Care Charges $Wound Debridement up to 20 cm: < or equal to 20 cm $ Wound Debridement each add'l 20 sqcm: 2 $PT Hydrotherapy Visit: 1 Visit       [name] 09/25/2022, 3:13 PM  Conni Slipper, PT, DPT Acute Rehabilitation Services Secure Chat Preferred Office: [phone]

## 2022-09-25 NOTE — Progress Notes (Signed)
NAME:  Albert Harris, MRN:  [id number], DOB:  1955-09-14, LOS: 28 ADMISSION DATE:  08/28/2022, CONSULTATION DATE:  08/28/22 REFERRING MD: Constance Goltz , CHIEF COMPLAINT:  Abdominal pain  History of Present Illness:  67 yo male brought to ER with abdominal pain, nausea, diaphoresis and unable to feel his Lt foot.  Hypotensive with lactic acidosis in ER.  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  Hx of ESRD and had hyperkalemia.  Had emergent CRRT.  Vascular consulted and taken to OR.    Pertinent  Medical History  ESRD -AV fistula (no flow tonight), PAD, COPD, HLD, HTN, HFrEF 35-40%, PAD  Significant Hospital Events: Including procedures, antibiotic start and stop dates in addition to other pertinent events   3/22 Admit, emergent CRRT, to OR for repair of distal Lt external iliac artery 3/24 Extubated. CT head obtained for altered mental status did not show any acute abnormalities 3/26 Off CRRT and pressors, Vascular surgery consulted for clotted AVG 3/27 AV graft thrombectomy attempted in OR but patient became hemodynamically unstable and procedure was aborted.  Was on pressors briefly 3/28 dialysis followed by CVC and aline removal for line holiday 3/29 MRI brain ordered for LLE weakness and found with right subinsular 4mm infarct. Neuro consulted 3/30 CT A/P increased hematoma in left pelvic region associated with Hg drop 9>7 4/1 hgb stable, TEE, new tunneled line planned 4/3 L groin bleeding. Taken to OR urgently by VVS for iliofemoral bypass, removal of infected patch, sartorius flap.  4/6, 4/7 ongoing rectal bleeding. Transfused both days.  4/6 flex sig with multiple non-bleeding ulcers.  4/8 ongoing rectal bleeding. Transfusing again. Did not drink bowel prep for colonoscopy. Aborted.  PCCM called back.  4/11 two large bloody BM's overnight with Hgb drop from 7.8 to 6.5 with hypotension, moved back to ICU and transferred  4/18 called to the floor for massive  hematochezia, intubated, bedside flex sig with bleeding ulcer, hemostatic gel applied 4/19 extubated  Interim History / Subjective:   Patient doing well since extubation yesterday. Has some pain in his left lower leg which is chronic. No further episodes of bloody stools since.   Objective   Blood pressure (!) 149/66, pulse 74, temperature 98.3 F (36.8 C), temperature source Oral, resp. rate 19, height  (1.727 m), weight 66.5 kg, SpO2 97 %.    Vent Mode: PSV;CPAP FiO2 (%):  [35 %] 35 % Set Rate:  [16 bmp] 16 bmp Vt Set:  [540 mL] 540 mL PEEP:  [5 cmH20] 5 cmH20 Pressure Support:  [5 cmH20-8 cmH20] 5 cmH20 Plateau Pressure:  [12 cmH20-16 cmH20] 12 cmH20   Intake/Output Summary (Last 24 hours) at 09/25/2022 0653 Last data filed at 09/24/2022 2000 Gross per 24 hour  Intake 606.41 ml  Output 1000 ml  Net -393.59 ml    Filed Weights   09/24/22 0703 09/24/22 1504 09/24/22 1852  Weight: 67.6 kg 67.6 kg 66.5 kg    General: elderly appearing HEENT: normocephalic atraumatic Neuro: alert and oriented x 3 and following commands CV: regular rate PULM:  clear bilaterally  GI: soft, non-distended Extremities: trace edema. Left groin wound Skin: Left lower quadrant wound vac in place.   Resolved Hospital Problem list   Septic shock Acute respiratory failure with hypoxia  Assessment & Plan:   Acute blood loss anemia secondary to hematochezia Acute lower GI bleeding Recurrent GI bleeding over the past few weeks secondary to rectal ulcers likely from his Crohn's disease.  Flex sig 4/6 with 5 non-bleeding ulcers identified He underwent colonoscopy which showed multiple rectal ulcers, one of the ulcers showed visible bleeding vessel, clips applied and hemorrhoids 4/18 - again with massive rectal bleeding. Ulcer that was clipped prior with bleeding, hemostatic gel applied. No further bleeding since. Case discussed with GI, general surgery, and vascular surgery. Do not believe  candidate for IR intervention with anatomy of vasculature. If any additional bleed will plan to take to OR for further intervention. In depth conversation with vascular/general surgery and patient's daugther in terms of poor prognoiss.   No further GI bleeding. Will continue to monitor and hopeful this has resolved.   -Continue to monitor for any additional bleeding, consult surgery immediately if so -Hemoglobin drop of 11.6 to 10.0, platelets from 179 to 119. Monitor daily -Appreciate general surgery, vascular surgery and GI assistance -Restart budesonide   Peripheral vascular disease Lt common femoral artery pseudoaneurysm  s/p Lt groin hematoma evacuation, primary repair of distal external iliac artery. Further complicated 4/3 by L groin bleeding and R/p bleed with new pseudoaneurysm and aterial thrombus. Now s/p  iliofemoral bypass, removal of infected patch, sartorius flap. Dressing changed yesterday. Will likely need LLE amputation.  -Postop care per vascular surgery -Continue wound VAC  MRSA bacteremia 2/2 post-op wound infection with MRSA and VRE WBC stable at 21. Last ID note 4/8, recommending daptomycin and monitoring of CPK. Will need 6 weeks of abx and indefinite PO suppression TEE negative for veg. Will need to follow up outpatient with ID to determine oral abx Surgical cultures grew MRSA and E. Faecium/VRE [facility] 3/27 negative -Daptomycin per ID, follow CK every Monday.  -Compelte IV course 05/17, PO abx indefinitely  ESRD Hyponatremia, improved Electrolytes stable. Received HD yesterday -appreciate nephrology assistance  -HD per nephrology via RIJ tunneled catheter.  -Closely monitor electrolytes  Deep pressure injury to sacrum Sacral wound with hydrotherapy every Friday. Will need to continue to follow with general surgery for consideration of debridement.  -follow wound care recommendations -Hydrotherapy with PT  Retroperitoneal hemorrhage, stable Resolved  Chronic  systolic CHF with EF 35 to 40% on Echo from 06/29/22. Hx of pulmonary hypertension. -Holding toprolol and plavix in setting of acute bleed  Acute metabolic encephalopahy from uremia, acidosis. Acute 4mm right subinsular stroke  encephalopathy resolved. Once resolution of bleeding will need to consider antiplatelet therapy for infarct.    Moderate malnutrition Oral feeds, would likely benefit from protein supplementation with significant course and wounds  Best Practice (right click and "Reselect all SmartList Selections" daily)   Diet/type: Resume NPO DVT prophylaxis: SCD GI prophylaxis: PPI. Lines: Dialysis Catheter tunneled  Foley:  N/A Code Status:  full code Last date of multidisciplinary goals of care discussion pending further conversations. Palliative care following  Thalia Bloodgood DO  Internal Medicine Resident PGY-3 [place]  Pager: [phone]

## 2022-09-25 NOTE — Progress Notes (Signed)
Pt arrived to 4E21 from 4M, patient placed on monitor, CCMD notified, VSS, Patient A&O x 4, will continue to monitor.

## 2022-09-25 NOTE — Progress Notes (Signed)
Physical Therapy Treatment Patient Details Name: Albert Harris MRN: [id number] DOB: September 16, 1955 Today's Date: 09/25/2022   History of Present Illness 67 year old male  ER with abdominal pain, nausea, diaphoresis and unable to feel his Lt foot. Hypotensive with lactic acidosis in ER.  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 CRRT 3/22-3/26.  3/22 repair of distal Lt external iliac artery. On 3/30, Hgb drop from 9 to 7 with worsening abdominal pain, CT abd 3/30 shows slightly larger anterior pelvic extraperitoneal hematoma. 4/18 -  massive hemoptysis requiring 4 units PRBC, bedside flex sig with bleeding ulcer, hemostatic gel applied, ETT 4/18. PMHx: PAD, ESRD HD, recently hospitalized 3/16-18 for acute on chronic left lower extremity ischemia.    PT Comments    PT spoke with vascular surgery team, per vascular pt is okay for OOB to chair and unrestricted L hip and leg ROM. Pt tolerated EOB sitting x8 minutes, requiring up to light physical assist to correct posterior bias. Pt overall mobilizing at mod +2 level for to/from EOB, limited in tolerance by fatigue demonstrated in body-wide tremors from muscular fatigue. Pt with limited LLE AROM. BP drop moving from supine to sitting this date but pt not symptomatic. PT to continue to follow.   BP: - Supine: 156/61 - sitting: 127/38 - return to supine: 154/68     Recommendations for follow up therapy are one component of a multi-disciplinary discharge planning process, led by the attending physician.  Recommendations may be updated based on patient status, additional functional criteria and insurance authorization.  Follow Up Recommendations  Can patient physically be transported by private vehicle: No    Assistance Recommended at Discharge Frequent or constant Supervision/Assistance  Patient can return home with the following Assist for transportation;Assistance with cooking/housework;Help with stairs or ramp for  entrance;A lot of help with walking and/or transfers;A lot of help with bathing/dressing/bathroom   Equipment Recommendations  Other (comment) (defer)    Recommendations for Other Services       Precautions / Restrictions Precautions Precautions: Fall Precaution Comments: L groin wound vac; Per verbal order of Dr. Chestine Spore and Dr. Lenell Antu 4/19 - unrestricted L hip ROM, pt appropriate to move OOB and mobilize as appropriate. Restrictions LLE Weight Bearing:  (no flextion in hip per surgury)     Mobility  Bed Mobility Overal bed mobility: Needs Assistance Bed Mobility: Rolling, Sit to Sidelying, Sidelying to Sit Rolling: Mod assist Sidelying to sit: Mod assist, +2 for physical assistance     Sit to sidelying: Mod assist, +2 for physical assistance General bed mobility comments: assist for log roll to and from EOB to prevent shearing on buttocks, mod +2 for trunk elevation/lowering, LE translation to/from EOB, and repositioning once returned to supine.    Transfers                   General transfer comment: nt - pt fatigue after sitting EOB x8 minutes    Ambulation/Gait                   Stairs             Wheelchair Mobility    Modified Rankin (Stroke Patients Only)       Balance Overall balance assessment: Needs assistance Sitting-balance support: Bilateral upper extremity supported, Feet supported Sitting balance-Leahy Scale: Fair Sitting balance - Comments: fair to poor, periods of supervision transitioning to min assist given fatigue and posterior truncal bias Postural control: Posterior lean  Standing balance comment: nt                            Cognition Arousal/Alertness: Awake/alert Behavior During Therapy: WFL for tasks assessed/performed Overall Cognitive Status: Within Functional Limits for tasks assessed                                 General Comments: pleasant and motivated        Exercises  General Exercises - Lower Extremity Long Arc Quad: AROM, Right, AAROM, Left, 5 reps, Seated    General Comments General comments (skin integrity, edema, etc.): mild drop in BP sitting EOB, pt reporting little to no dizziness and orthostasis improved vs last session      Pertinent Vitals/Pain Pain Assessment Pain Assessment: Faces Faces Pain Scale: Hurts little more Pain Location: L knee and ankle Pain Descriptors / Indicators: Grimacing, Guarding, Sore, Constant Pain Intervention(s): Limited activity within patient's tolerance, Monitored during session, Repositioned    Home Living                          Prior Function            PT Goals (current goals can now be found in the care plan section) Acute Rehab PT Goals Patient Stated Goal: Pt wants to get better and go home. PT Goal Formulation: With patient/family Time For Goal Achievement: 09/30/22 Potential to Achieve Goals: Fair Progress towards PT goals: Progressing toward goals    Frequency    Min 2X/week      PT Plan Current plan remains appropriate    Co-evaluation              AM-PAC PT "6 Clicks" Mobility   Outcome Measure  Help needed turning from your back to your side while in a flat bed without using bedrails?: A Lot Help needed moving from lying on your back to sitting on the side of a flat bed without using bedrails?: A Lot Help needed moving to and from a bed to a chair (including a wheelchair)?: Total Help needed standing up from a chair using your arms (e.g., wheelchair or bedside chair)?: Total Help needed to walk in hospital room?: Total Help needed climbing 3-5 steps with a railing? : Total 6 Click Score: 8    End of Session   Activity Tolerance: Patient limited by fatigue Patient left: in bed;with call bell/phone within reach;with family/visitor present;with bed alarm set Nurse Communication: Mobility status;Precautions (no hip ROM restrictions per vascular surgery  team) PT Visit Diagnosis: Other abnormalities of gait and mobility (R26.89);Muscle weakness (generalized) (M62.81);Difficulty in walking, not elsewhere classified (R26.2)     Time: 8657-8469 PT Time Calculation (min) (ACUTE ONLY): 21 min  Charges:  $Therapeutic Activity: 8-22 mins                     Marye Round, PT DPT Acute Rehabilitation Services Secure Chat Preferred  Office [phone]    Nikiyah Fackler E Christain [place] 09/25/2022, 11:48 AM

## 2022-09-25 NOTE — Progress Notes (Signed)
Progress Note   Subjective  No bleeding overnight, patient tolerating diet. Stable this AM. Family at bedside.   Objective   Vital signs in last 24 hours: Temp:  [97.6 F (36.4 C)-98.5 F (36.9 C)] 98.5 F (36.9 C) (04/19 0751) Pulse Rate:  [50-87] 77 (04/19 0600) Resp:  [12-21] 19 (04/19 0600) BP: (72-164)/(48-95) 155/63 (04/19 0600) SpO2:  [93 %-100 %] 96 % (04/19 0600) FiO2 (%):  [35 %] 35 % (04/18 1410) Weight:  [66.5 kg-68.5 kg] 68.5 kg (04/19 0500) Last BM Date : 09/23/22 General:    white male in NAD Neurologic:  Alert and oriented,  grossly normal neurologically. Psych:  Cooperative. Normal mood and affect.  Intake/Output from previous day: 04/18 0701 - 04/19 0700 In: 606.4 [I.V.:172.4; IV Piggyback:434] Out: 1000  Intake/Output this shift: No intake/output data recorded.  Lab Results: Recent Labs    09/23/22 0317 09/24/22 0318 09/24/22 0400 09/25/22 0509  WBC 16.9*  --  21.0* 14.3*  HGB 10.6* 11.2* 11.6* 10.1*  HCT 31.9* 33.0* 34.2* 29.9*  PLT 188  --  179 119*   BMET Recent Labs    09/23/22 0317 09/24/22 0318 09/24/22 0400 09/25/22 0509  NA 135 135 136 136  K 3.9 4.0 4.3 3.4*  CL 97*  --  97* 96*  CO2 27  --  25 28  GLUCOSE 107*  --  121* 82  BUN 36*  --  54* 28*  CREATININE 4.59*  --  5.69* 3.97*  CALCIUM 8.4*  --  7.9* 7.8*   LFT Recent Labs    09/24/22 0400 09/25/22 0509  PROT 4.5*  --   ALBUMIN 1.7* 1.6*  AST 19  --   ALT 11  --   ALKPHOS 92  --   BILITOT 1.6*  --    PT/INR No results for input(s): "LABPROT", "INR" in the last 72 hours.  Studies/Results: DG Chest Port 1 View  Result Date: 09/24/2022 CLINICAL DATA:  The central line placement EXAM: PORTABLE CHEST 1 VIEW COMPARISON:  09/24/2022 FINDINGS: The heart size and mediastinal contours are within normal limits. Both lungs are clear. The visualized skeletal structures are unremarkable. Interval placement of left IJ approach central venous catheter with tip in the  lower SVC. The remainder of the support apparatus is unchanged. IMPRESSION: Interval placement of left IJ approach central venous catheter with tip in the lower SVC. No pneumothorax. Electronically Signed   By: Deatra Robinson M.D.   On: 09/24/2022 03:22   Portable Chest x-ray  Result Date: 09/24/2022 CLINICAL DATA:  ETT EXAM: PORTABLE CHEST 1 VIEW COMPARISON:  09/13/2022 FINDINGS: Endotracheal tube terminates 5 cm above the carina. Lungs are essentially clear.  No pleural effusion or pneumothorax. Mild cardiomegaly. Right IJ catheter terminates at the cavoatrial junction. IMPRESSION: Endotracheal tube terminates 5 cm above the carina. Electronically Signed   By: Charline Bills M.D.   On: 09/24/2022 01:42       Assessment / Plan:    67 y/o male here with the following:  Recurrent lower GI bleed secondary to rectal ulcers Suspected Crohn's disease  Urgent flex sig 4/18 with briskly bleeding distal rectal ulcer treated with Purastat gel. He has had now 4 endoscopic procedures for this during his hospitalization. The ulcers are most c/w Crohn's disease. On medical therapy with Canasa and budesonide. Agree that yield of repeated endoscopic procedures for definitive hemostasis is low should he rebleed and may need a surgical intervention in that setting. Appreciate  surgical input. Thus far, he remains stable post last flex sig. I otherwise see that his budesonide was stopped, will clarify why with primary team and resume if no contraindications. Continue Canasa  Will follow peripherally over the weekend if he is otherwise stable, and reassess him Monday. Call with questions in the interim.  Harlin Rain, MD [facility]

## 2022-09-25 NOTE — Progress Notes (Addendum)
  Progress Note    09/25/2022 8:42 AM 1 Day Post-Op  Subjective:  some pain in the left knee. No major complaints after getting extubated yesterday    Vitals:   09/25/22 0600 09/25/22 0751  BP: (!) 155/63   Pulse: 77   Resp: 19   Temp:  98.5 F (36.9 C)  SpO2: 96%     Physical Exam: Cardiac:  regular Lungs:  nonlabored Incisions:  left groin/lower abdomen vac with good seal Extremities:  RLE with DP/PT doppler signals. Ischemic changes to left toes and cannot wiggle toes. Monophasic left PT/Pero doppler signals Abdomen:  soft, ND  CBC    Component Value Date/Time   WBC 14.3 (H) 09/25/2022 0509   RBC 3.34 (L) 09/25/2022 0509   HGB 10.1 (L) 09/25/2022 0509   HGB 10.4 (L) 06/18/2021 1431   HCT 29.9 (L) 09/25/2022 0509   HCT 31.6 (L) 06/18/2021 1431   PLT 119 (L) 09/25/2022 0509   PLT 142 (L) 06/18/2021 1431   MCV 89.5 09/25/2022 0509   MCV 96 06/18/2021 1431   MCH 30.2 09/25/2022 0509   MCHC 33.8 09/25/2022 0509   RDW 18.6 (H) 09/25/2022 0509   RDW 14.3 06/18/2021 1431   LYMPHSABS 1.5 08/28/2022 1810   LYMPHSABS 1.5 06/18/2021 1431   MONOABS 0.7 08/28/2022 1810   EOSABS 0.1 08/28/2022 1810   EOSABS 1.0 (H) 06/18/2021 1431   BASOSABS 0.1 08/28/2022 1810   BASOSABS 0.1 06/18/2021 1431    BMET    Component Value Date/Time   NA 136 09/25/2022 0509   NA 141 06/18/2021 1431   K 3.4 (L) 09/25/2022 0509   CL 96 (L) 09/25/2022 0509   CO2 28 09/25/2022 0509   GLUCOSE 82 09/25/2022 0509   BUN 28 (H) 09/25/2022 0509   BUN 20 06/18/2021 1431   CREATININE 3.97 (H) 09/25/2022 0509   CREATININE 1.15 02/19/2017 1049   CALCIUM 7.8 (L) 09/25/2022 0509   CALCIUM 8.5 (L) 06/10/2020 0900   GFRNONAA 16 (L) 09/25/2022 0509   GFRAA 6 (L) 03/04/2020 1045    INR    Component Value Date/Time   INR 1.2 08/12/2022 1501     Intake/Output Summary (Last 24 hours) at 09/25/2022 0842 Last data filed at 09/24/2022 2000 Gross per 24 hour  Intake 191.15 ml  Output 1000 ml   Net -808.85 ml      Assessment/Plan:  67 y.o. male is 16 days post op, s/p: removal of infected left femoral patch, iliofemoral bypass, with sartorius muscle flap   -LLE unchanged with soft PT/Peroneal doppler signals. Ischemic changes to the 3rd-5th toes and unable to wiggle toes. Having some left knee pain today -L groin wound vac with good seal. Next vac change is Monday -Continue daptomycin  Loel Dubonnet, PA-C [facility] [phone] 09/25/2022 8:42 AM   I have seen and evaluated the patient. I agree with the PA note as documented above.  Left groin VAC was changed yesterday with good seal today.  No further bleeding events from his anal ulcers with sacral decub.  Discussed with his daughter he remains very high risk for limb loss on the left as he really has no motor function in the foot with a foot drop and I really only get a venous signal.  He has since been extubated.  Resting comfortably.  Next vac change Monday.  Cephus Shelling, MD [facility] [place] Office: [phone]

## 2022-09-25 NOTE — Progress Notes (Signed)
1 Day Post-Op  Subjective: No new complaints today except some pain in his leg.  Denies any further blood or BMs since treatment yesterday morning.   Objective: Vital signs in last 24 hours: Temp:  [97.5 F (36.4 C)-98.3 F (36.8 C)] 98.3 F (36.8 C) (04/19 0353) Pulse Rate:  [50-87] 77 (04/19 0600) Resp:  [12-21] 19 (04/19 0600) BP: (72-164)/(48-109) 155/63 (04/19 0600) SpO2:  [93 %-100 %] 96 % (04/19 0600) FiO2 (%):  [35 %] 35 % (04/18 1410) Weight:  [66.5 kg-68.5 kg] 68.5 kg (04/19 0500) Last BM Date : 09/23/22  Intake/Output from previous day: 04/18 0701 - 04/19 0700 In: 606.4 [I.V.:172.4; IV Piggyback:434] Out: 1000  Intake/Output this shift: No intake/output data recorded.  PE: Gen: NAD Abd: soft, NT, ND  Lab Results:  Recent Labs    09/24/22 0400 09/25/22 0509  WBC 21.0* 14.3*  HGB 11.6* 10.1*  HCT 34.2* 29.9*  PLT 179 119*   BMET Recent Labs    09/24/22 0400 09/25/22 0509  NA 136 136  K 4.3 3.4*  CL 97* 96*  CO2 25 28  GLUCOSE 121* 82  BUN 54* 28*  CREATININE 5.69* 3.97*  CALCIUM 7.9* 7.8*   PT/INR No results for input(s): "LABPROT", "INR" in the last 72 hours. CMP     Component Value Date/Time   NA 136 09/25/2022 0509   NA 141 06/18/2021 1431   K 3.4 (L) 09/25/2022 0509   CL 96 (L) 09/25/2022 0509   CO2 28 09/25/2022 0509   GLUCOSE 82 09/25/2022 0509   BUN 28 (H) 09/25/2022 0509   BUN 20 06/18/2021 1431   CREATININE 3.97 (H) 09/25/2022 0509   CREATININE 1.15 02/19/2017 1049   CALCIUM 7.8 (L) 09/25/2022 0509   CALCIUM 8.5 (L) 06/10/2020 0900   PROT 4.5 (L) 09/24/2022 0400   PROT 6.5 06/18/2021 1431   ALBUMIN 1.6 (L) 09/25/2022 0509   ALBUMIN 3.7 (L) 06/18/2021 1431   AST 19 09/24/2022 0400   ALT 11 09/24/2022 0400   ALKPHOS 92 09/24/2022 0400   BILITOT 1.6 (H) 09/24/2022 0400   BILITOT 0.5 06/18/2021 1431   GFRNONAA 16 (L) 09/25/2022 0509   GFRAA 6 (L) 03/04/2020 1045   Lipase     Component Value Date/Time   LIPASE 23  08/28/2022 1811       Studies/Results: DG Chest Port 1 View  Result Date: 09/24/2022 CLINICAL DATA:  The central line placement EXAM: PORTABLE CHEST 1 VIEW COMPARISON:  09/24/2022 FINDINGS: The heart size and mediastinal contours are within normal limits. Both lungs are clear. The visualized skeletal structures are unremarkable. Interval placement of left IJ approach central venous catheter with tip in the lower SVC. The remainder of the support apparatus is unchanged. IMPRESSION: Interval placement of left IJ approach central venous catheter with tip in the lower SVC. No pneumothorax. Electronically Signed   By: Deatra Robinson M.D.   On: 09/24/2022 03:22   Portable Chest x-ray  Result Date: 09/24/2022 CLINICAL DATA:  ETT EXAM: PORTABLE CHEST 1 VIEW COMPARISON:  09/13/2022 FINDINGS: Endotracheal tube terminates 5 cm above the carina. Lungs are essentially clear.  No pleural effusion or pneumothorax. Mild cardiomegaly. Right IJ catheter terminates at the cavoatrial junction. IMPRESSION: Endotracheal tube terminates 5 cm above the carina. Electronically Signed   By: Charline Bills M.D.   On: 09/24/2022 01:42    Anti-infectives: Anti-infectives (From admission, onward)    Start     Dose/Rate Route Frequency Ordered Stop  09/26/22 1800  DAPTOmycin (CUBICIN) 750 mg in sodium chloride 0.9 % IVPB        10 mg/kg  75.2 kg 130 mL/hr over 30 Minutes Intravenous Every Sat (1800) 09/24/22 1342 10/23/22 2359   09/24/22 1800  DAPTOmycin (CUBICIN) 600 mg in sodium chloride 0.9 % IVPB        8 mg/kg  75.2 kg 124 mL/hr over 30 Minutes Intravenous Once per day on Tue Thu 09/24/22 1342 10/23/22 2359   09/19/22 1800  DAPTOmycin (CUBICIN) 750 mg in sodium chloride 0.9 % IVPB  Status:  Discontinued        10 mg/kg  75.2 kg 130 mL/hr over 30 Minutes Intravenous Every Sat (1800) 09/14/22 1622 09/24/22 1342   09/16/22 0830  DAPTOmycin (CUBICIN) 600 mg in sodium chloride 0.9 % IVPB  Status:  Discontinued         8 mg/kg  74.8 kg 124 mL/hr over 30 Minutes Intravenous  Once 09/16/22 0738 09/16/22 1555   09/15/22 1800  DAPTOmycin (CUBICIN) 600 mg in sodium chloride 0.9 % IVPB  Status:  Discontinued        8 mg/kg  75.2 kg 124 mL/hr over 30 Minutes Intravenous Once per day on Tue Thu 09/14/22 1622 09/24/22 1342   09/12/22 1800  DAPTOmycin (CUBICIN) 600 mg in sodium chloride 0.9 % IVPB        600 mg 124 mL/hr over 30 Minutes Intravenous  Once 09/12/22 1049 09/12/22 2033   09/11/22 1615  DAPTOmycin (CUBICIN) 600 mg in sodium chloride 0.9 % IVPB        600 mg 124 mL/hr over 30 Minutes Intravenous  Once 09/11/22 1522 09/11/22 2042   09/10/22 1200  vancomycin (VANCOREADY) IVPB 500 mg/100 mL  Status:  Discontinued        500 mg 100 mL/hr over 60 Minutes Intravenous Every T-Th-Sa (Hemodialysis) 09/09/22 0719 09/10/22 0719   09/10/22 0815  vancomycin (VANCOREADY) IVPB 750 mg/150 mL        750 mg 150 mL/hr over 60 Minutes Intravenous  Once 09/10/22 0719 09/10/22 0939   09/10/22 0718  vancomycin variable dose per unstable renal function (pharmacist dosing)  Status:  Discontinued         Does not apply See admin instructions 09/10/22 0719 09/11/22 1522   09/09/22 2000  piperacillin-tazobactam (ZOSYN) IVPB 2.25 g  Status:  Discontinued        2.25 g 100 mL/hr over 30 Minutes Intravenous Every 8 hours 09/09/22 1849 09/10/22 1003   09/09/22 1145  ceFAZolin (ANCEF) 2-4 GM/100ML-% IVPB       Note to Pharmacy: Payton Emerald A: cabinet override      09/09/22 1145 09/09/22 2359   09/08/22 0830  vancomycin (VANCOREADY) IVPB 750 mg/150 mL        750 mg 150 mL/hr over 60 Minutes Intravenous  Once 09/08/22 0743 09/08/22 1004   09/07/22 1200  ceFAZolin (ANCEF) IVPB 2g/100 mL premix       Note to Pharmacy: Give in IR for surgical prophylaxis   2 g 200 mL/hr over 30 Minutes Intravenous  Once 09/07/22 1100 09/07/22 1327   09/07/22 1101  ceFAZolin (ANCEF) 2-4 GM/100ML-% IVPB       Note to Pharmacy: Phebe Colla N:  cabinet override      09/07/22 1101 09/07/22 1335   09/07/22 0734  vancomycin variable dose per unstable renal function (pharmacist dosing)  Status:  Discontinued         Does not apply See  admin instructions 09/07/22 0734 09/09/22 0718   09/05/22 1600  vancomycin (VANCOREADY) IVPB 750 mg/150 mL        750 mg 150 mL/hr over 60 Minutes Intravenous  Once 09/05/22 1505 09/05/22 1717   09/02/22 1227  ceFAZolin (ANCEF) 2-4 GM/100ML-% IVPB       Note to Pharmacy: Isabel Caprice, Destiny: cabinet override      09/02/22 1227 09/03/22 0044   09/02/22 0520  vancomycin variable dose per unstable renal function (pharmacist dosing)  Status:  Discontinued         Does not apply See admin instructions 09/02/22 0521 09/05/22 0940   08/31/22 1800  vancomycin (VANCOREADY) IVPB 750 mg/150 mL  Status:  Discontinued        750 mg 150 mL/hr over 60 Minutes Intravenous Every 24 hours 08/31/22 0822 09/02/22 0521   08/30/22 1800  vancomycin (VANCOREADY) IVPB 1500 mg/300 mL        1,500 mg 150 mL/hr over 120 Minutes Intravenous STAT 08/30/22 1739 08/30/22 2139        Assessment/Plan LGIB secondary to anal ulcers, crohn's disease -has had multiple endoscopic procedures and treatments -unable to embo due to significant vascular disease as well as distal location deep in the rectum -if rebleeds, would need OR for attempt at suture placement to control bleeding -no current bleeding now.  Will monitor -further care per primary/consulting services.  FEN - renal diet VTE - on hold due to h/o bleeding ID - cubacin  Multiple medical problems  Palliative care involvement  I reviewed Consultant CCM, vascular, renal, GI notes, last 24 h vitals and pain scores, last 48 h intake and output, last 24 h labs and trends, and last 24 h imaging results.   LOS: 28 days    Letha Cape , [facility] 09/25/2022, 7:49 AM Please see Amion for pager number during day hours 7:00am-4:30pm or 7:00am -11:30am on  weekends

## 2022-09-26 ENCOUNTER — Encounter (HOSPITAL_COMMUNITY): Payer: Self-pay | Admitting: Pulmonary Disease

## 2022-09-26 ENCOUNTER — Inpatient Hospital Stay (HOSPITAL_COMMUNITY): Payer: Medicare Other | Admitting: Anesthesiology

## 2022-09-26 ENCOUNTER — Other Ambulatory Visit: Payer: Self-pay

## 2022-09-26 ENCOUNTER — Encounter (HOSPITAL_COMMUNITY)
Admission: EM | Disposition: A | Discharge status: Hospice | Payer: Self-pay | Source: Home / Self Care | Attending: Internal Medicine

## 2022-09-26 DIAGNOSIS — N186 End stage renal disease: Secondary | ICD-10-CM

## 2022-09-26 DIAGNOSIS — I509 Heart failure, unspecified: Secondary | ICD-10-CM

## 2022-09-26 DIAGNOSIS — K921 Melena: Secondary | ICD-10-CM | POA: Diagnosis not present

## 2022-09-26 DIAGNOSIS — A419 Sepsis, unspecified organism: Secondary | ICD-10-CM | POA: Diagnosis not present

## 2022-09-26 DIAGNOSIS — K626 Ulcer of anus and rectum: Secondary | ICD-10-CM | POA: Diagnosis not present

## 2022-09-26 DIAGNOSIS — Z992 Dependence on renal dialysis: Secondary | ICD-10-CM

## 2022-09-26 DIAGNOSIS — I132 Hypertensive heart and chronic kidney disease with heart failure and with stage 5 chronic kidney disease, or end stage renal disease: Secondary | ICD-10-CM | POA: Diagnosis not present

## 2022-09-26 DIAGNOSIS — J449 Chronic obstructive pulmonary disease, unspecified: Secondary | ICD-10-CM

## 2022-09-26 DIAGNOSIS — A4102 Sepsis due to Methicillin resistant Staphylococcus aureus: Secondary | ICD-10-CM | POA: Diagnosis not present

## 2022-09-26 DIAGNOSIS — D62 Acute posthemorrhagic anemia: Secondary | ICD-10-CM | POA: Diagnosis not present

## 2022-09-26 DIAGNOSIS — Z87891 Personal history of nicotine dependence: Secondary | ICD-10-CM

## 2022-09-26 LAB — RENAL FUNCTION PANEL
Albumin: 1.6 g/dL — ABNORMAL LOW (ref 3.5–5.0)
Anion gap: 13 (ref 5–15)
BUN: 48 mg/dL — ABNORMAL HIGH (ref 8–23)
CO2: 25 mmol/L (ref 22–32)
Calcium: 7.8 mg/dL — ABNORMAL LOW (ref 8.9–10.3)
Chloride: 95 mmol/L — ABNORMAL LOW (ref 98–111)
Creatinine, Ser: 5.59 mg/dL — ABNORMAL HIGH (ref 0.61–1.24)
GFR, Estimated: 10 mL/min — ABNORMAL LOW (ref >60)
Glucose, Bld: 109 mg/dL — ABNORMAL HIGH (ref 70–99)
Phosphorus: 4.1 mg/dL (ref 2.5–4.6)
Potassium: 3.8 mmol/L (ref 3.5–5.1)
Sodium: 133 mmol/L — ABNORMAL LOW (ref 135–145)

## 2022-09-26 LAB — BPAM RBC
Blood Product Expiration Date: 202404242359
Blood Product Expiration Date: 202405162359
Blood Product Expiration Date: 202405162359
Blood Product Expiration Date: 202405162359
Blood Product Expiration Date: 202404232359
ISSUE DATE / TIME: 202404161013
ISSUE DATE / TIME: 202404171149
ISSUE DATE / TIME: 202404172232
ISSUE DATE / TIME: 202404180102
ISSUE DATE / TIME: 202404180102
ISSUE DATE / TIME: 202404200809
Unit Type and Rh: 5100
Unit Type and Rh: 5100
Unit Type and Rh: 5100
Unit Type and Rh: 5100

## 2022-09-26 LAB — TYPE AND SCREEN
ABO/RH(D): O POS
Antibody Screen: NEGATIVE
Unit division: 0
Unit division: 0
Unit division: 0
Unit division: 0

## 2022-09-26 LAB — POCT I-STAT, CHEM 8
BUN: 44 mg/dL — ABNORMAL HIGH (ref 8–23)
Calcium, Ion: 1.15 mmol/L (ref 1.15–1.40)
Chloride: 97 mmol/L — ABNORMAL LOW (ref 98–111)
Creatinine, Ser: 6.5 mg/dL — ABNORMAL HIGH (ref 0.61–1.24)
Glucose, Bld: 94 mg/dL (ref 70–99)
HCT: 28 % — ABNORMAL LOW (ref 39.0–52.0)
Hemoglobin: 9.5 g/dL — ABNORMAL LOW (ref 13.0–17.0)
Potassium: 3.9 mmol/L (ref 3.5–5.1)
Sodium: 136 mmol/L (ref 135–145)
TCO2: 28 mmol/L (ref 22–32)

## 2022-09-26 LAB — HEMOGLOBIN AND HEMATOCRIT, BLOOD
HCT: 28.3 % — ABNORMAL LOW (ref 39.0–52.0)
HCT: 26.6 % — ABNORMAL LOW (ref 39.0–52.0)
Hemoglobin: 9.5 g/dL — ABNORMAL LOW (ref 13.0–17.0)
Hemoglobin: 9.1 g/dL — ABNORMAL LOW (ref 13.0–17.0)

## 2022-09-26 LAB — PREPARE RBC (CROSSMATCH)

## 2022-09-26 SURGERY — EXAM UNDER ANESTHESIA
Anesthesia: General

## 2022-09-26 MED ORDER — ROCURONIUM BROMIDE 50 MG/5ML IV SOSY
PREFILLED_SYRINGE | INTRAVENOUS | Status: DC | PRN
  Administered 2022-09-26: 50 mg via INTRAVENOUS

## 2022-09-26 MED ORDER — ETOMIDATE 2 MG/ML IV SOLN
INTRAVENOUS | Status: DC | PRN
  Administered 2022-09-26: 10 mg via INTRAVENOUS

## 2022-09-26 MED ORDER — AMISULPRIDE (ANTIEMETIC) 5 MG/2ML IV SOLN: 10.0000 mg | Freq: Once | INTRAVENOUS | Status: DC | PRN

## 2022-09-26 MED ORDER — FENTANYL CITRATE (PF) 100 MCG/2ML IJ SOLN
INTRAMUSCULAR | Status: AC
  Filled 2022-09-26: qty 2

## 2022-09-26 MED ORDER — FENTANYL CITRATE (PF) 250 MCG/5ML IJ SOLN
INTRAMUSCULAR | Status: DC | PRN
  Administered 2022-09-26 (×2): 50 ug via INTRAVENOUS

## 2022-09-26 MED ORDER — CHLORHEXIDINE GLUCONATE 0.12 % MT SOLN
15.0000 mL | Freq: Once | OROMUCOSAL | Status: AC
  Administered 2022-09-26: 15 mL via OROMUCOSAL

## 2022-09-26 MED ORDER — 0.9 % SODIUM CHLORIDE (POUR BTL) OPTIME
TOPICAL | Status: DC | PRN
  Administered 2022-09-26: 1000 mL

## 2022-09-26 MED ORDER — PROMETHAZINE HCL 25 MG/ML IJ SOLN: 6.2500 mg | INTRAMUSCULAR | Status: DC | PRN

## 2022-09-26 MED ORDER — PHENYLEPHRINE HCL-NACL 20-0.9 MG/250ML-% IV SOLN
INTRAVENOUS | Status: DC | PRN
  Administered 2022-09-26: 50 ug/min via INTRAVENOUS

## 2022-09-26 MED ORDER — OXYCODONE HCL 5 MG/5ML PO SOLN: 5.0000 mg | Freq: Once | ORAL | Status: DC | PRN

## 2022-09-26 MED ORDER — OXYCODONE HCL 5 MG PO TABS: 5.0000 mg | ORAL_TABLET | Freq: Once | ORAL | Status: DC | PRN

## 2022-09-26 MED ORDER — ORAL CARE MOUTH RINSE: 15.0000 mL | Freq: Once | OROMUCOSAL | Status: AC

## 2022-09-26 MED ORDER — SUGAMMADEX SODIUM 200 MG/2ML IV SOLN
INTRAVENOUS | Status: DC | PRN
  Administered 2022-09-26: 400 mg via INTRAVENOUS

## 2022-09-26 MED ORDER — FENTANYL CITRATE (PF) 250 MCG/5ML IJ SOLN
INTRAMUSCULAR | Status: AC
  Filled 2022-09-26: qty 5

## 2022-09-26 MED ORDER — ETOMIDATE 2 MG/ML IV SOLN
INTRAVENOUS | Status: AC
  Filled 2022-09-26: qty 10

## 2022-09-26 MED ORDER — ACETAMINOPHEN 325 MG PO TABS: 325.0000 mg | ORAL_TABLET | ORAL | Status: DC | PRN

## 2022-09-26 MED ORDER — SURGILUBE EX GEL
CUTANEOUS | Status: DC | PRN
  Administered 2022-09-26: 1 via TOPICAL

## 2022-09-26 MED ORDER — PHENYLEPHRINE HCL (PRESSORS) 10 MG/ML IV SOLN
INTRAVENOUS | Status: AC
  Filled 2022-09-26: qty 1

## 2022-09-26 MED ORDER — SODIUM CHLORIDE 0.9 % IV SOLN: INTRAVENOUS | Status: DC

## 2022-09-26 MED ORDER — SODIUM CHLORIDE 0.9% IV SOLUTION: Freq: Once | INTRAVENOUS | Status: DC

## 2022-09-26 MED ORDER — ONDANSETRON HCL 4 MG/2ML IJ SOLN
INTRAMUSCULAR | Status: AC
  Filled 2022-09-26: qty 2

## 2022-09-26 MED ORDER — ACETAMINOPHEN 10 MG/ML IV SOLN: 1000.0000 mg | Freq: Once | INTRAVENOUS | Status: DC | PRN

## 2022-09-26 MED ORDER — GELATIN ABSORBABLE 100 EX MISC
CUTANEOUS | Status: DC | PRN
  Administered 2022-09-26: 2 via TOPICAL

## 2022-09-26 MED ORDER — FENTANYL CITRATE (PF) 100 MCG/2ML IJ SOLN
25.0000 ug | INTRAMUSCULAR | Status: DC | PRN
  Administered 2022-09-26 (×2): 50 ug via INTRAVENOUS

## 2022-09-26 MED ORDER — ONDANSETRON HCL 4 MG/2ML IJ SOLN
INTRAMUSCULAR | Status: DC | PRN
  Administered 2022-09-26: 4 mg via INTRAVENOUS

## 2022-09-26 MED ORDER — ACETAMINOPHEN 160 MG/5ML PO SOLN: 325.0000 mg | ORAL | Status: DC | PRN

## 2022-09-26 MED ORDER — SODIUM CHLORIDE 0.9 % IV SOLN
750.0000 mg | Freq: Every day | INTRAVENOUS | Status: DC
  Filled 2022-09-26: qty 15

## 2022-09-26 MED ORDER — CHLORHEXIDINE GLUCONATE 0.12 % MT SOLN
OROMUCOSAL | Status: AC
  Filled 2022-09-26: qty 15

## 2022-09-26 SURGICAL SUPPLY — 42 items
BAG COUNTER SPONGE SURGICOUNT (BAG) ×2 IMPLANT
BAG SPNG CNTER NS LX DISP (BAG) ×1
CANISTER SUCT 3000ML PPV (MISCELLANEOUS) ×2 IMPLANT
COVER MAYO STAND STRL (DRAPES) ×2 IMPLANT
COVER SURGICAL LIGHT HANDLE (MISCELLANEOUS) ×2 IMPLANT
ELECT CAUTERY BLADE 6.4 (BLADE) ×2 IMPLANT
ELECT REM PT RETURN 9FT ADLT (ELECTROSURGICAL) ×1
ELECTRODE REM PT RTRN 9FT ADLT (ELECTROSURGICAL) ×2 IMPLANT
GAUZE 4X4 16PLY ~~LOC~~+RFID DBL (SPONGE) ×2 IMPLANT
GAUZE PAD ABD 8X10 STRL (GAUZE/BANDAGES/DRESSINGS) ×2 IMPLANT
GAUZE SPONGE 4X4 12PLY STRL (GAUZE/BANDAGES/DRESSINGS) ×2 IMPLANT
GLOVE BIO SURGEON STRL SZ7.5 (GLOVE) ×4 IMPLANT
GLOVE BIOGEL PI IND STRL 8 (GLOVE) ×2 IMPLANT
GOWN STRL REUS W/ TWL LRG LVL3 (GOWN DISPOSABLE) ×2 IMPLANT
GOWN STRL REUS W/ TWL XL LVL3 (GOWN DISPOSABLE) ×2 IMPLANT
GOWN STRL REUS W/TWL LRG LVL3 (GOWN DISPOSABLE) ×1
GOWN STRL REUS W/TWL XL LVL3 (GOWN DISPOSABLE) ×1
KIT BASIN OR (CUSTOM PROCEDURE TRAY) ×2 IMPLANT
KIT TURNOVER KIT B (KITS) ×2 IMPLANT
LOOP VASCLR MAXI BLUE 18IN ST (MISCELLANEOUS) IMPLANT
LOOP VASCULAR MAXI 18 BLUE (MISCELLANEOUS)
LOOPS VASCLR MAXI BLUE 18IN ST (MISCELLANEOUS) IMPLANT
NDL HYPO 25GX1X1/2 BEV (NEEDLE) ×2 IMPLANT
NEEDLE HYPO 25GX1X1/2 BEV (NEEDLE) ×1 IMPLANT
NS IRRIG 1000ML POUR BTL (IV SOLUTION) ×2 IMPLANT
PACK LITHOTOMY IV (CUSTOM PROCEDURE TRAY) ×2 IMPLANT
PAD ARMBOARD 7.5X6 YLW CONV (MISCELLANEOUS) ×2 IMPLANT
PENCIL BUTTON HOLSTER BLD 10FT (ELECTRODE) ×2 IMPLANT
SHEARS HARMONIC 9CM CVD (BLADE) IMPLANT
SPECIMEN JAR SMALL (MISCELLANEOUS) IMPLANT
SPIKE FLUID TRANSFER (MISCELLANEOUS) ×2 IMPLANT
SPONGE HEMORRHOID 8X3CM (HEMOSTASIS) IMPLANT
SURGILUBE 2OZ TUBE FLIPTOP (MISCELLANEOUS) ×2 IMPLANT
SUT CHROMIC 2 0 SH (SUTURE) IMPLANT
SUT CHROMIC 3 0 SH 27 (SUTURE) IMPLANT
SYR BULB EAR ULCER 3OZ GRN STR (SYRINGE) IMPLANT
SYR CONTROL 10ML LL (SYRINGE) ×2 IMPLANT
TOWEL GREEN STERILE (TOWEL DISPOSABLE) ×2 IMPLANT
TOWEL GREEN STERILE FF (TOWEL DISPOSABLE) ×2 IMPLANT
TUBE CONNECTING 12X1/4 (SUCTIONS) ×2 IMPLANT
VASCULAR TIE MAXI BLUE 18IN ST (MISCELLANEOUS)
YANKAUER SUCT BULB TIP NO VENT (SUCTIONS) ×2 IMPLANT

## 2022-09-26 NOTE — Anesthesia Postprocedure Evaluation (Signed)
Anesthesia Post Note  Patient: RENLY ROOTS  Procedure(s) Performed: Francia Greaves UNDER ANESTHESIA     Patient location during evaluation: PACU Anesthesia Type: General Level of consciousness: awake and alert Pain management: pain level controlled Vital Signs Assessment: post-procedure vital signs reviewed and stable Respiratory status: spontaneous breathing, nonlabored ventilation, respiratory function stable and patient connected to nasal cannula oxygen Cardiovascular status: blood pressure returned to baseline and stable Postop Assessment: no apparent nausea or vomiting Anesthetic complications: no  No notable events documented.  Last Vitals:  Vitals:   09/26/22 1018 09/26/22 1111  BP: (!) 142/71 133/78  Pulse: 71 71  Resp: 13 13  Temp: 36.5 C 36.6 C  SpO2: 100% 100%    Last Pain:  Vitals:   09/26/22 1130  TempSrc:   PainSc: Asleep                 Shelton Silvas

## 2022-09-26 NOTE — Progress Notes (Addendum)
Patient continues to actively bleed from rectum. Saturating towel that is placed against rectum. B/p down now 119/50. Updated elink.   Awaiting answers

## 2022-09-26 NOTE — Progress Notes (Signed)
[name], male   DOB: Oct 17, 1955, 67 y.o.   MRN: [id number]   Unfortunately, Mr. Gazda started having significant bleeding from his anus again earlier this morning.  He has received another unit of packed red blood cells and his systolic blood pressure has increased from just above 100 to 158.  His hemoglobin decreased from 11.6-9.5.    He has passed several large blood clots.   I discussed this with the patient and his family.  The plan will be to proceed to the operating room for an urgent examination and anesthesia with an attempt to oversew the bleeding ulcers.  I discussed the risks which includes but is not limited to the inability to stop the bleeding despite sutures and Gelfoam given his previous history of Crohn's disease.  Again, this area is not amenable to embolization from interventional radiology given his extensive vascular disease. I also discussed the risk of necrosis of the mucosa of the anus from the suturing, the need to stay intubated postoperatively, ongoing bleeding, etc. They understand this may not be successful and there are no other options as he would not tolerate any other extensive procedures given his overall medical condition.

## 2022-09-26 NOTE — Progress Notes (Signed)
Noticed that patient had blood down to his knee. Attempted to clean pt up and blood continues to pour out of rectum. Blood pressure 160-170. Notified elink. H&H sent

## 2022-09-26 NOTE — Progress Notes (Addendum)
PCCM called to evaluate patient for bloody bowel mvt's and concern for icu needs. Patient around 8 pm yesterday had large bloody BM and SBP 180-190s. E-link notified surgery who believes patient is already planned for surgery. Patient made NPO. Given prn hydralazine. Overnight patient has had a few more bloody bowel mvt's with clots and BP now trending down but stable with maps above 70s. Hgb  9.5 from 10.1 yesterday. Patient was given 1 unit of PRBCs.   Plan: -continue to monitor in progressive for now while hemodynamically stable; if patient BP worsens and becomes hemodynamically unstable will transfer patient to icu -surgery notified x2 and aware; plan for OR in am -continue current transfusion; check h/h post transfusion -dc prn labetalol and hydralazine  JD Anselm Lis [facility] 09/26/2022, 5:53 AM  Please see [email or web address] for pager details.  From 7A-7P if no response, please call [phone]. After hours, please call ELink [phone].

## 2022-09-26 NOTE — Plan of Care (Signed)
  Problem: Health Behavior/Discharge Planning: Goal: Ability to manage health-related needs will improve Outcome: Not Progressing   

## 2022-09-26 NOTE — Progress Notes (Signed)
Palliative Medicine Progress Note   Patient Name: Albert Harris       Date: 09/26/2022 DOB: May 18, 1956  Age: 67 y.o. MRN#: [id number] Attending Physician: Kathlen Mody, MD Primary Care Physician: [name], NP Admit Date: 08/28/2022  Reason for Consultation/Follow-up: {Reason for Consult:23484}  HPI/Patient Profile: 67 y.o. male  with past medical history of ESRD on HD, peripheral vascular disease, chronic systolic CHF with EF 35 to 40%, and COPD.  He presented to [facility] on 08/28/2022 with abdominal pain, nausea, and unable to feel his left foot.  He was found to have a left femoral pseudoaneurysm and was taken to the OR for repair of his distal left external iliac artery.  He required emergent CRRT due to hypotension and hyperkalemia.  Hospitalization has been complicated by MRSA bacteremia secondary to post-op wound infection and recurrent rectal bleeding requiring multiple transfusions.    Palliative Medicine was consulted for goals of care in the setting of ESRD, severe vascular disease, and bleeding.  Subjective: ***  Objective:  Physical Exam          Vital Signs: BP (!) 141/72 (BP Location: Left Arm)   Pulse 66   Temp 97.9 F (36.6 C) (Oral)   Resp 15   Ht 5' 7.99" (1.727 m)   Wt 68.5 kg   SpO2 100%   BMI 22.97 kg/m  SpO2: SpO2: 100 % O2 Device: O2 Device: Room Air    LBM: Last BM Date : 09/25/22     Palliative Assessment/Data: ***     Palliative Medicine Assessment & Plan   Assessment: Principal Problem:   Septic shock Active Problems:   Lower GI bleed   Metabolic encephalopathy   Lactic acidosis   ESRD (end stage renal disease) on dialysis   Malnutrition of moderate degree   Pseudoaneurysm of femoral artery   Decreased functional mobility   MRSA  (methicillin resistant Staphylococcus aureus) septicemia   Pressure injury of skin   Colonic ulcer   Rectal arterial hemorrhage   Rectal ulcer   Hematochezia   Acute blood loss anemia   MRSA bacteremia   Lower GI bleeding    Recommendations/Plan: ***  Goals of Care and Additional Recommendations: Limitations on Scope of Treatment: {Recommended Scope and Preferences:21019}  Code Status:   Prognosis:  {Palliative Care Prognosis:23504}  Discharge Planning: {  Palliative dispostion:23505}  Care plan was discussed with ***  Thank you for allowing the Palliative Medicine Team to assist in the care of this patient.   ***   Lavena Bullion, NP   Please contact Palliative Medicine Team phone at [phone] for questions and concerns.  For individual providers, please see AMION.

## 2022-09-26 NOTE — Anesthesia Preprocedure Evaluation (Addendum)
Anesthesia Evaluation  Patient identified by MRN, date of birth, ID band Patient awake    Reviewed: Allergy & Precautions, NPO status , Patient's Chart, lab work & pertinent test results  Airway Mallampati: I  TM Distance: >3 FB Neck ROM: Full    Dental  (+) Edentulous Upper, Missing, Dental Advisory Given   Pulmonary asthma , COPD, former smoker    + decreased breath sounds      Cardiovascular hypertension, + Peripheral Vascular Disease and +CHF   Rhythm:Regular Rate:Normal     Neuro/Psych TIACVA    GI/Hepatic Neg liver ROS, PUD,GERD  ,,  Endo/Other    Renal/GU ESRF and DialysisRenal disease     Musculoskeletal   Abdominal   Peds  Hematology   Anesthesia Other Findings   Reproductive/Obstetrics                             Anesthesia Physical Anesthesia Plan  ASA: 4 and emergent  Anesthesia Plan: General   Post-op Pain Management: Tylenol PO (pre-op)*   Induction: Intravenous, Rapid sequence and Cricoid pressure planned  PONV Risk Score and Plan: 3 and Ondansetron and Treatment may vary due to age or medical condition  Airway Management Planned: Oral ETT  Additional Equipment: None  Intra-op Plan:   Post-operative Plan: Extubation in OR  Informed Consent: I have reviewed the patients History and Physical, chart, labs and discussed the procedure including the risks, benefits and alternatives for the proposed anesthesia with the patient or authorized representative who has indicated his/her understanding and acceptance.     Dental advisory given  Plan Discussed with: CRNA  Anesthesia Plan Comments:        Anesthesia Quick Evaluation

## 2022-09-26 NOTE — Progress Notes (Signed)
[facility]                                                                               [place], is a 67 y.o. male, DOB - Jul 24, 1955, ZOX:[id number] Admit date - 08/28/2022    Outpatient Primary MD for the patient is [name], NP  LOS - 29  days    Brief summary   67 year old male with history of ESRD on HD TTS, PVD,COPD, essential hypertension, chronic systolic congestive heart failure with ejection fraction 35 to 40%, presented with abdominal pain nausea and diaphoresis.Patient was also hypotensive with a lactic acidosis and was admitted to ICU,with a pulseless left lower extremity was found to have femoral pseudoaneurysm. Vascular surgery consulted and underwent  iliofemoral bypass with sartorius muscle flap and removal of infected left femoral patch. Culture came back with MRSA bacteremia-TEE neg .  Significant Hospital Events; 3/22 Admit, emergent CRRT, to OR for repair of distal Lt external iliac artery 3/24 Extubated. CT head obtained for altered mental status did not show any acute abnormalities 3/26 Off CRRT and pressors, Vascular surgery consulted for clotted AVG 3/27 AV graft thrombectomy attempted in OR but patient became hemodynamically unstable and procedure was aborted.  Was on pressors briefly 3/28 dialysis followed by CVC and aline removal for line holiday 3/29 MRI brain ordered for LLE weakness and found with right subinsular 4mm infarct. Neuro consulted 3/30 CT A/P increased hematoma in left pelvic region associated with Hg drop 9>7 4/1 hgb stable, TEE, new tunneled line planned 4/3 L groin bleeding. Taken to OR urgently by [facility] for iliofemoral bypass, removal of infected patch, sartorius flap.  4/6, 4/7 ongoing rectal bleeding. Transfused both days.  4/6 flex sig with multiple non-bleeding ulcers.  4/8 ongoing rectal bleeding. Transfusing again. Did not drink bowel prep for colonoscopy. Aborted.  PCCM called back. ID recommended 6 weeks  of IV antibiotics followed by po suppression indefinitely.  4/11 two large bloody BM's overnight with Hgb drop from 7.8 to 6.5 with hypotension, moved back to ICU and transferred  4/18 called to the floor for massive hematochezia, intubated, bedside flex sig with bleeding ulcer, hemostatic gel applied 4/19 extubated, bleeding anal ulcers with hypotension. 4/20 ligation of the bleeding anal ulcers by Dr Magnus Ivan.    Assessment & Plan    Assessment and Plan:  Acute blood loss anemia from bleeding anal/rectal ulcers possibly from his Crohn's disease Patient underwent flex sigmoidoscopy on 4/6 was found to have 5 nonbleeding ulcers.  He also underwent colonoscopy which showed multiple rectal ulcers with visible bleeding underwent hemostasis. Patient developed massive rectal bleeding again on 4/18. Patient is not a candidate for IR intervention due to severe peripheral vascular disease and anatomy General surgery on board, was taken to the OR this morning and underwent suturing of the bleeding anal ulcers  Hemoglobin this morning around  9.5. continue to monitor and transfuse to keep it greater than 8.  Restarted him on Budesonide on 4/19 an dcontinue with mesalamine bid.     PVD / left common femoral artery pseudoaneurysm S/p left groin hematoma evacuation, primary repair of the distal external iliac artery. Further complicated on 4/3  by left groin bleeding with new pseudoaneurysm and arterial thrombus. S/p iliofemoral bypass and removal of infected patch with sartorius muscle flap. Further management as per vascular surgery Continue with wound VAC    MRSA bacteremia secondary to postop wound infection with MRSA and Enterococcus faecium. Infectious disease note from 4/8 recommending daptomycin for 6 weeks followed by indefinite oral suppression of antibiotics. Monitor CPK weekly TEE is negative for vegetations. Patient will need outpatient follow-up with infectious disease to  determine oral antibiotics. Last dose of IV antibiotics on Oct 23, 2022 followed by oral antibiotics indefinitely.    End-stage renal disease Nephrology on board and dialysis via right IJ tunneled catheter.  Chronic systolic heart failure Last echocardiogram from January 2024 showed left ventricular ejection fraction of 35 to 40% Pulmonary hypertension Holding metoprolol and Plavix in the setting of acute bleeding Fluid management as per dialysis.    Acute metabolic encephalopathy from uremia, acidosis and acute 4 mm right subinsular stroke Once bleeding is controlled, will need antiplatelet therapy for acute infarct     Retroperitoneal hemorrhage Stable   Moderate malnutrition Nutritionist on board, supplementations to be ordered   RN Pressure Injury Documentation: Pressure Injury 08/28/22 Sacrum Medial Unstageable - Full thickness tissue loss in which the base of the injury is covered by slough (yellow, tan, gray, green or brown) and/or eschar (tan, brown or black) in the wound bed. Sacral wound with eschar coveri (Active)  08/28/22 2228  Location: Sacrum  Location Orientation: Medial  Staging: Unstageable - Full thickness tissue loss in which the base of the injury is covered by slough (yellow, tan, gray, green or brown) and/or eschar (tan, brown or black) in the wound bed.  Wound Description (Comments): Sacral wound with eschar covering wound bed  Present on Admission: Yes  Dressing Type Foam - Lift dressing to assess site every shift 09/26/22 0800     Pressure Injury 09/04/22 Heel Left Deep Tissue Pressure Injury - Purple or maroon localized area of discolored intact skin or blood-filled blister due to damage of underlying soft tissue from pressure and/or shear. (Active)  09/04/22 1200  Location: Heel  Location Orientation: Left  Staging: Deep Tissue Pressure Injury - Purple or maroon localized area of discolored intact skin or blood-filled blister due to damage of  underlying soft tissue from pressure and/or shear.  Wound Description (Comments):   Present on Admission:   Dressing Type Foam - Lift dressing to assess site every shift 09/26/22 0800  Wound care on board.   Malnutrition Type:  Nutrition Problem: Moderate Malnutrition (in the context of chronic illness (ESRD)) Etiology: poor appetite   Malnutrition Characteristics:  Signs/Symptoms: moderate fat depletion, severe muscle depletion   Nutrition Interventions:  Interventions: Refer to RD note for recommendations, Liberalize Diet, Nepro shake, MVI  Estimated body mass index is 22.97 kg/m as calculated from the following:   Height as of this encounter: 5' 7.99" (1.727 m).   Weight as of this encounter: 68.5 kg.  Code Status: full code.  DVT Prophylaxis:  SCD's Start: 09/09/22 1841   Level of Care: Level of care: Progressive Family Communication: none at bedside.   Disposition Plan:     Remains inpatient appropriate:  bleeding anal ulcers.   Procedures:  Bleeding anal ulcer ligation  Consultants:   PCCM,  General surgery.  Vascular surgery.  Gastroenterology.  Neurology. ID  IR  Antimicrobials:   Anti-infectives (From admission, onward)    Start     Dose/Rate Route Frequency Ordered Stop  09/26/22 1800  DAPTOmycin (CUBICIN) 750 mg in sodium chloride 0.9 % IVPB        10 mg/kg  75.2 kg 130 mL/hr over 30 Minutes Intravenous Every Sat (1800) 09/24/22 1342 10/23/22 2359   09/24/22 1800  DAPTOmycin (CUBICIN) 600 mg in sodium chloride 0.9 % IVPB        8 mg/kg  75.2 kg 124 mL/hr over 30 Minutes Intravenous Once per day on Tue Thu 09/24/22 1342 10/23/22 2359   09/19/22 1800  DAPTOmycin (CUBICIN) 750 mg in sodium chloride 0.9 % IVPB  Status:  Discontinued        10 mg/kg  75.2 kg 130 mL/hr over 30 Minutes Intravenous Every Sat (1800) 09/14/22 1622 09/24/22 1342   09/16/22 0830  DAPTOmycin (CUBICIN) 600 mg in sodium chloride 0.9 % IVPB  Status:  Discontinued        8  mg/kg  74.8 kg 124 mL/hr over 30 Minutes Intravenous  Once 09/16/22 0738 09/16/22 1555   09/15/22 1800  DAPTOmycin (CUBICIN) 600 mg in sodium chloride 0.9 % IVPB  Status:  Discontinued        8 mg/kg  75.2 kg 124 mL/hr over 30 Minutes Intravenous Once per day on Tue Thu 09/14/22 1622 09/24/22 1342   09/12/22 1800  DAPTOmycin (CUBICIN) 600 mg in sodium chloride 0.9 % IVPB        600 mg 124 mL/hr over 30 Minutes Intravenous  Once 09/12/22 1049 09/12/22 2033   09/11/22 1615  DAPTOmycin (CUBICIN) 600 mg in sodium chloride 0.9 % IVPB        600 mg 124 mL/hr over 30 Minutes Intravenous  Once 09/11/22 1522 09/11/22 2042   09/10/22 1200  vancomycin (VANCOREADY) IVPB 500 mg/100 mL  Status:  Discontinued        500 mg 100 mL/hr over 60 Minutes Intravenous Every T-Th-Sa (Hemodialysis) 09/09/22 0719 09/10/22 0719   09/10/22 0815  vancomycin (VANCOREADY) IVPB 750 mg/150 mL        750 mg 150 mL/hr over 60 Minutes Intravenous  Once 09/10/22 0719 09/10/22 0939   09/10/22 0718  vancomycin variable dose per unstable renal function (pharmacist dosing)  Status:  Discontinued         Does not apply See admin instructions 09/10/22 0719 09/11/22 1522   09/09/22 2000  piperacillin-tazobactam (ZOSYN) IVPB 2.25 g  Status:  Discontinued        2.25 g 100 mL/hr over 30 Minutes Intravenous Every 8 hours 09/09/22 1849 09/10/22 1003   09/09/22 1145  ceFAZolin (ANCEF) 2-4 GM/100ML-% IVPB       Note to Pharmacy: Payton Emerald A: cabinet override      09/09/22 1145 09/09/22 2359   09/08/22 0830  vancomycin (VANCOREADY) IVPB 750 mg/150 mL        750 mg 150 mL/hr over 60 Minutes Intravenous  Once 09/08/22 0743 09/08/22 1004   09/07/22 1200  ceFAZolin (ANCEF) IVPB 2g/100 mL premix       Note to Pharmacy: Give in IR for surgical prophylaxis   2 g 200 mL/hr over 30 Minutes Intravenous  Once 09/07/22 1100 09/07/22 1327   09/07/22 1101  ceFAZolin (ANCEF) 2-4 GM/100ML-% IVPB       Note to Pharmacy: Phebe Colla N:  cabinet override      09/07/22 1101 09/07/22 1335   09/07/22 0734  vancomycin variable dose per unstable renal function (pharmacist dosing)  Status:  Discontinued         Does not apply See  admin instructions 09/07/22 0734 09/09/22 0718   09/05/22 1600  vancomycin (VANCOREADY) IVPB 750 mg/150 mL        750 mg 150 mL/hr over 60 Minutes Intravenous  Once 09/05/22 1505 09/05/22 1717   09/02/22 1227  ceFAZolin (ANCEF) 2-4 GM/100ML-% IVPB       Note to Pharmacy: Isabel Caprice, Destiny: cabinet override      09/02/22 1227 09/03/22 0044   09/02/22 0520  vancomycin variable dose per unstable renal function (pharmacist dosing)  Status:  Discontinued         Does not apply See admin instructions 09/02/22 0521 09/05/22 0940   08/31/22 1800  vancomycin (VANCOREADY) IVPB 750 mg/150 mL  Status:  Discontinued        750 mg 150 mL/hr over 60 Minutes Intravenous Every 24 hours 08/31/22 0822 09/02/22 0521   08/30/22 1800  vancomycin (VANCOREADY) IVPB 1500 mg/300 mL        1,500 mg 150 mL/hr over 120 Minutes Intravenous STAT 08/30/22 1739 08/30/22 2139        Medications  Scheduled Meds:  (feeding supplement) PROSource Plus  30 mL Oral TID WC   sodium chloride   Intravenous Once   acetaminophen  650 mg Oral Q6H   ascorbic acid  250 mg Oral BID   budesonide  9 mg Oral Daily   calcium acetate  1,334 mg Oral TID WC   Chlorhexidine Gluconate Cloth  6 each Topical Q0600   [START ON 10/01/2022] collagenase   Topical Daily   darbepoetin (ARANESP) injection - DIALYSIS  200 mcg Subcutaneous Q Wed-1800   feeding supplement  1 Container Oral TID BM   liver oil-zinc oxide   Topical TID   mesalamine  1,000 mg Rectal BID   methocarbamol  500 mg Oral TID   multivitamin  1 tablet Oral QHS   nutrition supplement (JUVEN)  1 packet Oral BID BM   pantoprazole  40 mg Oral BID   rosuvastatin  10 mg Oral Daily   sodium chloride flush  10-40 mL Intracatheter Q12H   sodium hypochlorite   Irrigation BID   vitamin A  10,000  Units Oral Daily   Continuous Infusions:  sodium chloride 0 mL/hr at 09/22/22 0757   DAPTOmycin (CUBICIN) 600 mg in sodium chloride 0.9 % IVPB Stopped (09/24/22 1759)   DAPTOmycin (CUBICIN) 750 mg in sodium chloride 0.9 % IVPB     sodium chloride     PRN Meds:.alteplase, dicyclomine, heparin, HYDROmorphone (DILAUDID) injection, oxyCODONE, pentafluoroprop-tetrafluoroeth, sodium chloride, traMADol    Subjective:   Albert Harris was seen and examined today.  Multiple runs of rectal bleeding earlier this am.   Objective:   Vitals:   09/26/22 1000 09/26/22 1001 09/26/22 1018 09/26/22 1111  BP: (!) 175/76 (!) 175/76 (!) 142/71 133/78  Pulse: 73  71 71  Resp: 13  13 13   Temp: 98 F (36.7 C)  97.7 F (36.5 C) 97.9 F (36.6 C)  TempSrc:   Oral Oral  SpO2: 100%  100% 100%  Weight:      Height:        Intake/Output Summary (Last 24 hours) at 09/26/2022 1134 Last data filed at 09/26/2022 0934 Gross per 24 hour  Intake 597 ml  Output 50 ml  Net 547 ml   Filed Weights   09/24/22 1852 09/25/22 0500 09/26/22 0836  Weight: 66.5 kg 68.5 kg 68.5 kg     Exam General exam: Appears calm and comfortable  Respiratory system: Clear to auscultation. Respiratory  effort normal. Cardiovascular system: S1 & S2 heard, RRR. No JVD,  Gastrointestinal system: Abdomen is nondistended, soft and nontender.  Central nervous system: Alert and oriented. No focal neurological deficits. Extremities: left groin wound connected to wound vac.  Skin: see above Psychiatry: . Mood & affect appropriate.    Data Reviewed:  I have personally reviewed following labs and imaging studies   CBC Lab Results  Component Value Date   WBC 14.3 (H) 09/25/2022   RBC 3.34 (L) 09/25/2022   HGB 9.5 (L) 09/26/2022   HCT 28.0 (L) 09/26/2022   MCV 89.5 09/25/2022   MCH 30.2 09/25/2022   PLT 119 (L) 09/25/2022   MCHC 33.8 09/25/2022   RDW 18.6 (H) 09/25/2022   LYMPHSABS 1.5 08/28/2022   MONOABS 0.7 08/28/2022    EOSABS 0.1 08/28/2022   BASOSABS 0.1 08/28/2022     Last metabolic panel Lab Results  Component Value Date   NA 136 09/26/2022   K 3.9 09/26/2022   CL 97 (L) 09/26/2022   CO2 25 09/26/2022   BUN 44 (H) 09/26/2022   CREATININE 6.50 (H) 09/26/2022   GLUCOSE 94 09/26/2022   GFRNONAA 10 (L) 09/26/2022   GFRAA 6 (L) 03/04/2020   CALCIUM 7.8 (L) 09/26/2022   PHOS 4.1 09/26/2022   PROT 4.5 (L) 09/24/2022   ALBUMIN 1.6 (L) 09/26/2022   LABGLOB 2.8 06/18/2021   AGRATIO 1.3 06/18/2021   BILITOT 1.6 (H) 09/24/2022   ALKPHOS 92 09/24/2022   AST 19 09/24/2022   ALT 11 09/24/2022   ANIONGAP 13 09/26/2022    CBG (last 3)  Recent Labs    09/25/22 0749 09/25/22 1109 09/25/22 1546  GLUCAP 79 92 112*      Coagulation Profile: No results for input(s): "INR", "PROTIME" in the last 168 hours.   Radiology Studies: No results found.     Kathlen Mody M.D. [facility] 09/26/2022, 11:34 AM  Available via Epic secure chat 7am-7pm After 7 pm, please refer to night coverage provider listed on amion.

## 2022-09-26 NOTE — Progress Notes (Signed)
Placed towel against patient's rectum to keep the blood flow from getting all over the patient and bed. Towel is now saturated where it is sitting against patient's rectum. B/p down to 137/51  Will notify elink.

## 2022-09-26 NOTE — Progress Notes (Signed)
  Progress Note    09/26/2022 7:15 AM 2 Days Post-Op  Subjective:  resting comfortably in recovery; says his left foot is a little sore but not any different.   afebrile  Vitals:   09/26/22 0446 09/26/22 0530  BP: 109/80 116/61  Pulse: 77   Resp: 18   Temp: 98.1 F (36.7 C) 97.9 F (36.6 C)  SpO2: 97%     Physical Exam: General:  no distress Lungs:  non labored Incisions:  left groin with wound vac with good seal Extremities:  monophasic left peroneal doppler signal; brisk biphasic right PT doppler signal; sensory intact right foot; left leg below the knee.  Motor and sensory not in tact left foot.     CBC    Component Value Date/Time   WBC 14.3 (H) 09/25/2022 0509   RBC 3.34 (L) 09/25/2022 0509   HGB 9.5 (L) 09/26/2022 0217   HGB 10.4 (L) 06/18/2021 1431   HCT 28.3 (L) 09/26/2022 0217   HCT 31.6 (L) 06/18/2021 1431   PLT 119 (L) 09/25/2022 0509   PLT 142 (L) 06/18/2021 1431   MCV 89.5 09/25/2022 0509   MCV 96 06/18/2021 1431   MCH 30.2 09/25/2022 0509   MCHC 33.8 09/25/2022 0509   RDW 18.6 (H) 09/25/2022 0509   RDW 14.3 06/18/2021 1431   LYMPHSABS 1.5 08/28/2022 1810   LYMPHSABS 1.5 06/18/2021 1431   MONOABS 0.7 08/28/2022 1810   EOSABS 0.1 08/28/2022 1810   EOSABS 1.0 (H) 06/18/2021 1431   BASOSABS 0.1 08/28/2022 1810   BASOSABS 0.1 06/18/2021 1431    BMET    Component Value Date/Time   NA 133 (L) 09/26/2022 0217   NA 141 06/18/2021 1431   K 3.8 09/26/2022 0217   CL 95 (L) 09/26/2022 0217   CO2 25 09/26/2022 0217   GLUCOSE 109 (H) 09/26/2022 0217   BUN 48 (H) 09/26/2022 0217   BUN 20 06/18/2021 1431   CREATININE 5.59 (H) 09/26/2022 0217   CREATININE 1.15 02/19/2017 1049   CALCIUM 7.8 (L) 09/26/2022 0217   CALCIUM 8.5 (L) 06/10/2020 0900   GFRNONAA 10 (L) 09/26/2022 0217   GFRAA 6 (L) 03/04/2020 1045    INR    Component Value Date/Time   INR 1.2 08/12/2022 1501     Intake/Output Summary (Last 24 hours) at 09/26/2022 0715 Last data  filed at 09/26/2022 0605 Gross per 24 hour  Intake 687 ml  Output 25 ml  Net 662 ml      Assessment/Plan:  67 y.o. male is s/p:   removal of infected left femoral patch, iliofemoral bypass, with sartorius muscle flap 09/09/2022   -LLE is stable;  pt subjectively states left foot sore but not any different.  Continues to be at high risk for amputation but currently stable.  Wound vac left groin is with good seal.  -pt with continued GI bleeding overnight.  Went to OR today with general surgery for EUA with suturing of anal ulceration.      Doreatha Massed, PA-C [facility] [phone] 09/26/2022 7:15 AM

## 2022-09-26 NOTE — Transfer of Care (Signed)
Immediate Anesthesia Transfer of Care Note  Patient: Albert Harris  Procedure(s) Performed: Francia Greaves UNDER ANESTHESIA  Patient Location: PACU  Anesthesia Type:General  Level of Consciousness: awake and patient cooperative  Airway & Oxygen Therapy: Patient Spontanous Breathing  Post-op Assessment: Report given to RN and Post -op Vital signs reviewed and stable  Post vital signs: Reviewed and stable  Last Vitals:  Vitals Value Taken Time  BP 165/89 09/26/22 0930  Temp    Pulse 72 09/26/22 0934  Resp 20 09/26/22 0934  SpO2 100 % 09/26/22 0934  Vitals shown include unvalidated device data.  Last Pain:  Vitals:   09/26/22 0800  TempSrc:   PainSc: 0-No pain      Patients Stated Pain Goal: 5 (09/23/22 2008)  Complications: No notable events documented.

## 2022-09-26 NOTE — Op Note (Signed)
   Andrej Spagnoli Renninger 09/26/2022   Pre-op Diagnosis: Rectal bleeding     Post-op Diagnosis: same  Procedure(s): EXAM UNDER ANESTHESIA OVER SEW OF BLEEDING ANAL ULCERS  Surgeon(s): Abigail Miyamoto, MD  Anesthesia: General  Staff:  Circulator: Fae Pippin, RN Scrub Person: Iantha Fallen, RN  Estimated Blood Loss: Minimal               Indications: This is a 67 year old gentleman with ongoing intermittent bleeding from anal ulcers.  He has multiple significant comorbidities including significant vascular disease and end-stage renal disease on hemodialysis.  He has had flexible sigmoidoscopy twice for the bleeding ulcers in his anal canal.  These areas are not amenable to IR embolization because of his extensive vascular disease.  Because of recurrent anal bleeding and hypotension, the decision was made to proceed to the operating room for 1 attempted oversew of the bleeding areas.  This was discussed at length with the family  Findings: The patient was found to have bleeding from the posterior located ulcer seen on previous endoscopy.  The clip on the left lateral ulcer was intact.  There was also bleeding from a small ulcer on the anterior anal wall. Hemostasis appeared to be controlled with sutures and Gelfoam  Procedure: The patient was brought to the operating identified as a correct patient.  He was placed upon the operating table and general anesthesia was induced.  He was then placed in the lithotomy position.  His perianal area was prepped and draped in the usual sterile fashion.  I inserted a retractor into the anal canal.  There was fresh clot in the anal canal.  I suctioned this out.  I could easily identify the endoscopy clip on the left lateral sidewall.  I cannot demonstrate active bleeding from this area but I still chose to oversew this area with 2 separate 2-0 silk sutures.  I then identified a small ulcer bleeding anteriorly which I controlled with figure-of-eight  silk sutures as well.  The most brisk bleeding was from the posterior located ulcer.  I appear to achieve hemostasis at this ulcer with several 2-0 silk sutures and the electrocautery.  No other areas of bleeding were identified.  I then placed 2 large pieces of Gelfoam in the anal canal.  The patient tolerated the procedure.  All the counts were correct at the end of the procedure.  An attempt will be made to extubate the patient in the operating room.  He remained hemodynamically stable throughout.          Abigail Miyamoto   Date: 09/26/2022  Time: 9:20 AM

## 2022-09-26 NOTE — Progress Notes (Signed)
[facility] Progress Note   Subjective:    Seen and examined patient at bedside. Noted patient had significant bleeding from his anus again and urgently sent to the OR this morning. He received another unit PRBCs today. S/p suturing of bleeding anal ulcers by Dr. Magnus Ivan. Patient remains extubated, on RA, and BP controlled. He is not in acute distress. Plan for HD tomorrow morning to give him a break. Patient now down-graded to medical floor.  Objective Vitals:   09/26/22 1001 09/26/22 1018 09/26/22 1111 09/26/22 1524  BP: (!) 175/76 (!) 142/71 133/78 (!) 141/72  Pulse:  71 71 66  Resp:  Temp:  97.7 F (36.5 C) 97.9 F (36.6 C) 97.9 F (36.6 C)  TempSrc:  Oral Oral Oral  SpO2:  100% 100% 100%  Weight:      Height:       Physical Exam General: Extubated and off pressors. On RA, NAD Heart: RRR; no murmur Lungs: CTA anteriorly Abdomen: soft Extremities: 1+ bilateral LE edema; L foot with ischemic distal 3rd-5th toes Dialysis Access: [facility]  Filed Weights   09/24/22 1852 09/25/22 0500 09/26/22 0836  Weight: 66.5 kg 68.5 kg 68.5 kg    Intake/Output Summary (Last 24 hours) at 09/26/2022 1617 Last data filed at 09/26/2022 0934 Gross per 24 hour  Intake 357 ml  Output 50 ml  Net 307 ml    Additional Objective Labs: Basic Metabolic Panel: Recent Labs  Lab 09/24/22 0400 09/25/22 0509 09/26/22 0217 09/26/22 0912  NA 136 136 133* 136  K 4.3 3.4* 3.8 3.9  CL 97* 96* 95* 97*  CO2 --   GLUCOSE 121* 82 109* 94  BUN 54* 28* 48* 44*  CREATININE 5.69* 3.97* 5.59* 6.50*  CALCIUM 7.9* 7.8* 7.8*  --   PHOS 5.7* 3.5 4.1  --    Liver Function Tests: Recent Labs  Lab 09/24/22 0400 09/25/22 0509 09/26/22 0217  AST 19  --   --   ALT 11  --   --   ALKPHOS 92  --   --   BILITOT 1.6*  --   --   PROT 4.5*  --   --   ALBUMIN 1.7* 1.6* 1.6*   No results for input(s): "LIPASE", "AMYLASE" in the last 168 hours. CBC: Recent Labs  Lab  09/22/22 0433 09/22/22 2038 09/23/22 0317 09/24/22 0318 09/24/22 0400 09/25/22 0509 09/25/22 2240 09/26/22 0217 09/26/22 0912  WBC 21.9* 20.7* 16.9*  --  21.0* 14.3*  --   --   --   HGB 7.0* 11.2* 10.6*   < > 11.6* 10.1* 9.9* 9.5* 9.5*  HCT 21.8* 34.9* 31.9*   < > 34.2* 29.9* 29.2* 28.3* 28.0*  MCV 97.8 92.6 90.6  --  90.0 89.5  --   --   --   PLT 263 186 188  --  179 119*  --   --   --    < > = values in this interval not displayed.   Blood Culture    Component Value Date/Time   SDES TISSUE 09/09/2022 1453   SPECREQUEST VESSEL PT ON VANC ANCEF 09/09/2022 1453   CULT  09/09/2022 1453    RARE STAPHYLOCOCCUS AUREUS SUSCEPTIBILITIES PERFORMED ON PREVIOUS CULTURE WITHIN THE LAST 5 DAYS. NO ANAEROBES ISOLATED Performed at [name], [address], [place], [place] [address]    REPTSTATUS 09/14/2022 FINAL 09/09/2022 1453    Cardiac Enzymes: Recent Labs  Lab  09/22/22 0331  CKTOTAL 26*   CBG: Recent Labs  Lab 09/24/22 2322 09/25/22 0335 09/25/22 0749 09/25/22 1109 09/25/22 1546  GLUCAP 87 82 79 92 112*   Iron Studies: No results for input(s): "IRON", "TIBC", "TRANSFERRIN", "FERRITIN" in the last 72 hours. Lab Results  Component Value Date   INR 1.2 08/12/2022   INR 1.3 (H) 06/29/2022   INR 1.3 (H) 06/28/2022   Studies/Results: No results found.  Medications:  sodium chloride 0 mL/hr at 09/22/22 0757   DAPTOmycin (CUBICIN) 600 mg in sodium chloride 0.9 % IVPB Stopped (09/24/22 1759)   DAPTOmycin (CUBICIN) 750 mg in sodium chloride 0.9 % IVPB     sodium chloride      (feeding supplement) PROSource Plus  30 mL Oral TID WC   sodium chloride   Intravenous Once   acetaminophen  650 mg Oral Q6H   ascorbic acid  250 mg Oral BID   budesonide  9 mg Oral Daily   calcium acetate  1,334 mg Oral TID WC   Chlorhexidine Gluconate Cloth  6 each Topical Q0600   [START ON 10/01/2022] collagenase   Topical Daily   darbepoetin (ARANESP) injection - DIALYSIS  200 mcg  Subcutaneous Q Wed-1800   feeding supplement  1 Container Oral TID BM   liver oil-zinc oxide   Topical TID   mesalamine  1,000 mg Rectal BID   methocarbamol  500 mg Oral TID   multivitamin  1 tablet Oral QHS   nutrition supplement (JUVEN)  1 packet Oral BID BM   pantoprazole  40 mg Oral BID   rosuvastatin  10 mg Oral Daily   sodium chloride flush  10-40 mL Intracatheter Q12H   sodium hypochlorite   Irrigation BID   vitamin A  10,000 Units Oral Daily    Dialysis Orders: TTS SW  4h   350/800  64.5kg  RFA AVG -> clotted/ new TDC in place Heparin none  - last OP HD 3/14, post wt 65.9kg - venofer  IV weekly - rocaltrol 0.25 mcg po tiw - phoslo 2 ac tid - no esa, last Hb 11.0  Assessment/Plan: Recurrent lower GIB: S/p colonoscopy 09/15/22, then repeat sigmoidoscopy 4/11. Several bleeding ulcers addressed with endoscopy.  Path came back + Crohn's - started on mesalamine by GI. Brisk re-bleed 4/18, back for emergent sigmoidoscopy with hemostatic gel applied to bleeding ulcer. Patient started bleeding again from his [place] this morning-s/p suturing of bleeding ulcers today by Dr. Hilbert Corrigan now down-graded to medical floor. Left common femoral artery pseudoaneurysm - s/p L femoral exploration with pseudoaneurysm repair 08/29/22, then iliofemoral bypass, removal of infected patch with sartorius muscle flap on 09/09/22. Ischemic changes to L 3rd-5th toes - still high risk of limb loss. Plan pending at this time.   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  CVA: + stroke on MRI. Neuro signed off.  ESRD: Continue HD on TTS schedule, did require short-term CRRT 3/23- 3/26. Next HD 4/21. BP/volume: Variable, 1+ edema on exam. Push UF as tolerated Anemia (ESRD + ABLA): Last transfused 1U on 4/20 (that brings total to 24U this admit). Also on Aranesp q Wed (max dose).  Secondary HPTH:  CorrCa high, Phos ok. Holding VDRA for now. Will work to get him off  Phoslo as outpatient - can continue for now. Dialysis access: AVG clotted 3/27, went for declot by VVS and then re-clotted. VVS placed  R TDC on 4/1.  HFrEF (EF 35-40%)  Salome Holmes, NP [facility] 09/26/2022,4:17 PM  LOS: 29 days

## 2022-09-26 NOTE — Progress Notes (Signed)
Patient arrived back to 4E from OR. VSS. Telemetry monitor applied. Call bell in reach.  Kenard Gower, RN

## 2022-09-26 NOTE — Anesthesia Procedure Notes (Signed)
Procedure Name: Intubation Date/Time: 09/26/2022 8:52 AM  Performed by: Adria Dill, CRNAPre-anesthesia Checklist: Patient identified, Emergency Drugs available, Suction available and Patient being monitored Patient Re-evaluated:Patient Re-evaluated prior to induction Oxygen Delivery Method: Circle system utilized Preoxygenation: Pre-oxygenation with 100% oxygen Induction Type: IV induction Ventilation: Mask ventilation without difficulty Laryngoscope Size: Miller and 3 Grade View: Grade I Tube type: Oral Tube size: 7.5 mm Number of attempts: 1 Airway Equipment and Method: Stylet and Oral airway Placement Confirmation: ETT inserted through vocal cords under direct vision, positive ETCO2 and breath sounds checked- equal and bilateral Secured at: 22 cm Tube secured with: Tape Dental Injury: Teeth and Oropharynx as per pre-operative assessment

## 2022-09-26 NOTE — Progress Notes (Signed)
Inserted scheduled suppository, patient had small amount of bloody stool on bed pad. B/p down to 156/75

## 2022-09-27 DIAGNOSIS — N186 End stage renal disease: Secondary | ICD-10-CM | POA: Diagnosis not present

## 2022-09-27 DIAGNOSIS — A4102 Sepsis due to Methicillin resistant Staphylococcus aureus: Secondary | ICD-10-CM | POA: Diagnosis not present

## 2022-09-27 DIAGNOSIS — D62 Acute posthemorrhagic anemia: Secondary | ICD-10-CM | POA: Diagnosis not present

## 2022-09-27 DIAGNOSIS — K626 Ulcer of anus and rectum: Secondary | ICD-10-CM | POA: Diagnosis not present

## 2022-09-27 LAB — RENAL FUNCTION PANEL
Albumin: <1.5 g/dL — ABNORMAL LOW (ref 3.5–5.0)
Anion gap: 14 (ref 5–15)
BUN: 61 mg/dL — ABNORMAL HIGH (ref 8–23)
CO2: 24 mmol/L (ref 22–32)
Calcium: 7.7 mg/dL — ABNORMAL LOW (ref 8.9–10.3)
Chloride: 97 mmol/L — ABNORMAL LOW (ref 98–111)
Creatinine, Ser: 7.14 mg/dL — ABNORMAL HIGH (ref 0.61–1.24)
GFR, Estimated: 8 mL/min — ABNORMAL LOW (ref >60)
Glucose, Bld: 85 mg/dL (ref 70–99)
Phosphorus: 6.1 mg/dL — ABNORMAL HIGH (ref 2.5–4.6)
Potassium: 4.3 mmol/L (ref 3.5–5.1)
Sodium: 135 mmol/L (ref 135–145)

## 2022-09-27 LAB — BPAM RBC
ISSUE DATE / TIME: 202404200440
ISSUE DATE / TIME: 202404202358
Unit Type and Rh: 5100
Unit Type and Rh: 5100
Unit Type and Rh: 5100
Unit Type and Rh: 5100

## 2022-09-27 LAB — TYPE AND SCREEN: Unit division: 0

## 2022-09-27 MED ORDER — SODIUM CHLORIDE 0.9 % IV SOLN
750.0000 mg | Freq: Once | INTRAVENOUS | Status: AC
  Administered 2022-09-27: 750 mg via INTRAVENOUS
  Filled 2022-09-27 (×2): qty 15

## 2022-09-27 MED ORDER — METOPROLOL SUCCINATE ER 25 MG PO TB24
12.5000 mg | ORAL_TABLET | Freq: Every day | ORAL | Status: DC
  Administered 2022-09-27: 12.5 mg via ORAL
  Filled 2022-09-27: qty 1

## 2022-09-27 NOTE — Progress Notes (Addendum)
[facility] Progress Note   Subjective:    Patient completed HD this morning. Tolerated net UF 2L. C/o bilateral leg pain. Denies SOB, CP, N/V.  Objective Vitals:   09/27/22 0830 09/27/22 0840 09/27/22 0846 09/27/22 0847  BP: 123/62 (!) 120/106 (!) 129/112   Pulse: 78 89 78   Resp: Temp:   97.9 F (36.6 C)   TempSrc:   Oral   SpO2: 100% 100% 100%   Weight:    72.5 kg  Height:       Physical Exam General: Awake, alert, Extubated and off pressors. On RA, NAD Heart: RRR; no murmur Lungs: CTA anteriorly Abdomen: soft Extremities: 1+ bilateral LE edema; L foot with ischemic distal 3rd-5th toes Dialysis Access: [facility]  Filed Weights   09/26/22 0836 09/27/22 0500 09/27/22 0847  Weight: 68.5 kg 74 kg 72.5 kg    Intake/Output Summary (Last 24 hours) at 09/27/2022 0902 Last data filed at 09/27/2022 0846 Gross per 24 hour  Intake 510 ml  Output 2050 ml  Net -1540 ml    Additional Objective Labs: Basic Metabolic Panel: Recent Labs  Lab 09/25/22 0509 09/26/22 0217 09/26/22 0912 09/27/22 0230  NA 136 133* 136 135  K 3.4* 3.8 3.9 4.3  CL 96* 95* 97* 97*  CO2 28 25  --  24  GLUCOSE 82 109* 94 85  BUN 28* 48* 44* 61*  CREATININE 3.97* 5.59* 6.50* 7.14*  CALCIUM 7.8* 7.8*  --  7.7*  PHOS 3.5 4.1  --  6.1*   Liver Function Tests: Recent Labs  Lab 09/24/22 0400 09/25/22 0509 09/26/22 0217 09/27/22 0230  AST 19  --   --   --   ALT 11  --   --   --   ALKPHOS 92  --   --   --   BILITOT 1.6*  --   --   --   PROT 4.5*  --   --   --   ALBUMIN 1.7* 1.6* 1.6* <1.5*   No results for input(s): "LIPASE", "AMYLASE" in the last 168 hours. CBC: Recent Labs  Lab 09/22/22 0433 09/22/22 2038 09/23/22 0317 09/24/22 0318 09/24/22 0400 09/25/22 0509 09/25/22 2240 09/26/22 0217 09/26/22 0912 09/26/22 1715  WBC 21.9* 20.7* 16.9*  --  21.0* 14.3*  --   --   --   --   HGB 7.0* 11.2* 10.6*   < > 11.6* 10.1*   < > 9.5* 9.5* 9.1*  HCT 21.8* 34.9* 31.9*    < > 34.2* 29.9*   < > 28.3* 28.0* 26.6*  MCV 97.8 92.6 90.6  --  90.0 89.5  --   --   --   --   PLT 263 186 188  --  179 119*  --   --   --   --    < > = values in this interval not displayed.   Blood Culture    Component Value Date/Time   SDES TISSUE 09/09/2022 1453   SPECREQUEST VESSEL PT ON VANC ANCEF 09/09/2022 1453   CULT  09/09/2022 1453    RARE STAPHYLOCOCCUS AUREUS SUSCEPTIBILITIES PERFORMED ON PREVIOUS CULTURE WITHIN THE LAST 5 DAYS. NO ANAEROBES ISOLATED Performed at [facility], [address] [place], [place] [address]    REPTSTATUS 09/14/2022 FINAL 09/09/2022 1453    Cardiac Enzymes: Recent Labs  Lab 09/22/22 0331  CKTOTAL 26*   CBG: Recent Labs  Lab 09/24/22 2322 09/25/22 0335 09/25/22 0749  09/25/22 1109 09/25/22 1546  GLUCAP 87 82 79 92 112*   Iron Studies: No results for input(s): "IRON", "TIBC", "TRANSFERRIN", "FERRITIN" in the last 72 hours. Lab Results  Component Value Date   INR 1.2 08/12/2022   INR 1.3 (H) 06/29/2022   INR 1.3 (H) 06/28/2022   Studies/Results: No results found.  Medications:  sodium chloride 0 mL/hr at 09/22/22 0757   DAPTOmycin (CUBICIN) 600 mg in sodium chloride 0.9 % IVPB Stopped (09/24/22 1759)   DAPTOmycin (CUBICIN) 750 mg in sodium chloride 0.9 % IVPB Stopped (09/26/22 1731)   DAPTOmycin (CUBICIN) 750 mg in sodium chloride 0.9 % IVPB     sodium chloride      (feeding supplement) PROSource Plus  30 mL Oral TID WC   sodium chloride   Intravenous Once   acetaminophen  650 mg Oral Q6H   ascorbic acid  250 mg Oral BID   budesonide  9 mg Oral Daily   calcium acetate  1,334 mg Oral TID WC   Chlorhexidine Gluconate Cloth  6 each Topical Q0600   [START ON 10/01/2022] collagenase   Topical Daily   darbepoetin (ARANESP) injection - DIALYSIS  200 mcg Subcutaneous Q Wed-1800   feeding supplement  1 Container Oral TID BM   liver oil-zinc oxide   Topical TID   mesalamine  1,000 mg Rectal BID   methocarbamol  500 mg  Oral TID   multivitamin  1 tablet Oral QHS   nutrition supplement (JUVEN)  1 packet Oral BID BM   pantoprazole  40 mg Oral BID   rosuvastatin  10 mg Oral Daily   sodium chloride flush  10-40 mL Intracatheter Q12H   sodium hypochlorite   Irrigation BID   vitamin A  10,000 Units Oral Daily    Dialysis Orders: TTS SW  4h   350/800  64.5kg  RFA AVG -> clotted/ new TDC in place Heparin none  - last OP HD 3/14, post wt 65.9kg - venofer  IV weekly - rocaltrol 0.25 mcg po tiw - phoslo 2 ac tid - no esa, last Hb 11.0  Assessment/Plan: Recurrent lower GIB: S/p colonoscopy 09/15/22, then repeat sigmoidoscopy 4/11. Several bleeding ulcers addressed with endoscopy.  Path came back + Crohn's - started on mesalamine by GI. Brisk re-bleed 4/18, back for emergent sigmoidoscopy with hemostatic gel applied to bleeding ulcer. Patient started bleeding again from his rectum/ anas yesterday morning-s/p suturing of bleeding ulcers 4/20 by Dr. Hilbert Corrigan now down-graded to medical floor. Left common femoral artery pseudoaneurysm - s/p L femoral exploration with pseudoaneurysm repair 08/29/22, then iliofemoral bypass, removal of infected patch with sartorius muscle flap on 09/09/22. Ischemic changes to L 3rd-5th toes - still high risk of limb loss. Plan pending at this time.   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  CVA: + stroke on MRI. Neuro signed off.  ESRD: Continue HD on TTS schedule, did require short-term CRRT 3/23- 3/26. Received HD 4/21-2L removed. Next HD 4/23 per his usual schedule. BP/volume: Variable, 1+ edema on exam. Push UF as tolerated Anemia (ESRD + ABLA): Last transfused 1U on 4/20 (that brings total to 24U this admit). Also on Aranesp q Wed (max dose).  Secondary HPTH: CorrCa high but now at goal, Phos ok. Continue holding VDRA for now. Will work to get him off  Phoslo as  outpatient - can continue for now. Dialysis access: AVG clotted  3/27, went for declot by VVS and then re-clotted. VVS placed R TDC on 4/1.  HFrEF (EF 35-40%)  Salome Holmes, NP [facility] 09/27/2022,9:02 AM  LOS: 30 days    Pt seen, examined and agree w assess/plan as above with additions as indicated.  Rob [facility] 09/27/2022, 6:54 PM

## 2022-09-27 NOTE — Progress Notes (Signed)
[facility]                                                                               Albert Harris, is a 67 y.o. male, DOB - 1956/03/08, ZOX:[id number] Admit date - 08/28/2022    Outpatient Primary MD for the patient is [name], NP  LOS - 30  days    Brief summary   67 year old male with history of ESRD on HD TTS, PVD,COPD, essential hypertension, chronic systolic congestive heart failure with ejection fraction 35 to 40%, presented with abdominal pain nausea and diaphoresis.Patient was also hypotensive with a lactic acidosis and was admitted to ICU,with a pulseless left lower extremity was found to have femoral pseudoaneurysm. Vascular surgery consulted and underwent  iliofemoral bypass with sartorius muscle flap and removal of infected left femoral patch. Culture came back with MRSA bacteremia-TEE neg .  Significant Hospital Events; 3/22 Admit, emergent CRRT, to OR for repair of distal Lt external iliac artery 3/24 Extubated. CT head obtained for altered mental status did not show any acute abnormalities 3/26 Off CRRT and pressors, Vascular surgery consulted for clotted AVG 3/27 AV graft thrombectomy attempted in OR but patient became hemodynamically unstable and procedure was aborted.  Was on pressors briefly 3/28 dialysis followed by CVC and aline removal for line holiday 3/29 MRI brain ordered for LLE weakness and found with right subinsular 4mm infarct. Neuro consulted 3/30 CT A/P increased hematoma in left pelvic region associated with Hg drop 9>7 4/1 hgb stable, TEE, new tunneled line planned 4/3 L groin bleeding. Taken to OR urgently by [facility] for iliofemoral bypass, removal of infected patch, sartorius flap.  4/6, 4/7 ongoing rectal bleeding. Transfused both days.  4/6 flex sig with multiple non-bleeding ulcers.  4/8 ongoing rectal bleeding. Transfusing again. Did not drink bowel prep for colonoscopy. Aborted.  PCCM called back. ID recommended 6 weeks  of IV antibiotics followed by po suppression indefinitely.  4/11 two large bloody BM's overnight with Hgb drop from 7.8 to 6.5 with hypotension, moved back to ICU and transferred  4/18 called to the floor for massive hematochezia, intubated, bedside flex sig with bleeding ulcer, hemostatic gel applied 4/19 extubated, bleeding anal ulcers with hypotension. 4/20 S/p EUA and over sewing of ulcers and packing anal canal with gelfoam by Dr Magnus Ivan.  No new complaints overnight.    Assessment & Plan    Assessment and Plan:  Acute blood loss anemia from bleeding anal/rectal ulcers possibly from his Crohn's disease Patient underwent flex sigmoidoscopy on 4/6 was found to have 5 nonbleeding ulcers.  He also underwent colonoscopy which showed multiple rectal ulcers with visible bleeding underwent hemostasis. Patient developed massive rectal bleeding again on 4/18. Patient is not a candidate for IR intervention due to severe peripheral vascular disease and anatomy General surgery on board, was taken to the OR  on 4/20 and underwent S/p EUA and over sewing of ulcers and packing anal canal with gelfoam  by Dr Magnus Ivan.  Hemoglobin this morning around  9.5. continue to monitor and transfuse to keep it greater than 8.  Restarted him on Budesonide on 4/19 an dcontinue with mesalamine bid.  PVD / left common femoral artery pseudoaneurysm S/p left groin hematoma evacuation, primary repair of the distal external iliac artery. Further complicated on 4/3 by left groin bleeding with new pseudoaneurysm and arterial thrombus. S/p iliofemoral bypass and removal of infected patch with sartorius muscle flap. Further management as per vascular surgery Continue with wound VAC    MRSA bacteremia secondary to postop wound infection with MRSA and Enterococcus faecium. Infectious disease note from 4/8 recommending daptomycin for 6 weeks followed by indefinite oral suppression of antibiotics. Monitor CPK  weekly TEE is negative for vegetations. Patient will need outpatient follow-up with infectious disease to determine oral antibiotics. Last dose of IV antibiotics on Oct 23, 2022 followed by oral antibiotics indefinitely.    End-stage renal disease Nephrology on board and dialysis via right IJ tunneled catheter. Further HD sessions as per nephrology.   Chronic systolic heart failure Last echocardiogram from January 2024 showed left ventricular ejection fraction of 35 to 40% Pulmonary hypertension Holding metoprolol and Plavix in the setting of acute bleeding Fluid management as per dialysis.    Acute metabolic encephalopathy from uremia, acidosis and acute 4 mm right subinsular stroke Once bleeding is controlled, will need antiplatelet therapy for acute infarct. Will probably start him on anti platelet agent in am.      Retroperitoneal hemorrhage Stable   Moderate malnutrition Nutritionist on board, supplementations to be ordered   RN Pressure Injury Documentation: Pressure Injury 08/28/22 Sacrum Medial Unstageable - Full thickness tissue loss in which the base of the injury is covered by slough (yellow, tan, gray, green or brown) and/or eschar (tan, brown or black) in the wound bed. Sacral wound with eschar coveri (Active)  08/28/22 2228  Location: Sacrum  Location Orientation: Medial  Staging: Unstageable - Full thickness tissue loss in which the base of the injury is covered by slough (yellow, tan, gray, green or brown) and/or eschar (tan, brown or black) in the wound bed.  Wound Description (Comments): Sacral wound with eschar covering wound bed  Present on Admission: Yes  Dressing Type Foam - Lift dressing to assess site every shift 09/27/22 0941     Pressure Injury 09/04/22 Heel Left Deep Tissue Pressure Injury - Purple or maroon localized area of discolored intact skin or blood-filled blister due to damage of underlying soft tissue from pressure and/or shear. (Active)   09/04/22 1200  Location: Heel  Location Orientation: Left  Staging: Deep Tissue Pressure Injury - Purple or maroon localized area of discolored intact skin or blood-filled blister due to damage of underlying soft tissue from pressure and/or shear.  Wound Description (Comments):   Present on Admission:   Dressing Type Foam - Lift dressing to assess site every shift 09/27/22 0941  Wound care on board.   Malnutrition Type:  Nutrition Problem: Moderate Malnutrition (in the context of chronic illness (ESRD)) Etiology: poor appetite   Malnutrition Characteristics:  Signs/Symptoms: moderate fat depletion, severe muscle depletion   Nutrition Interventions:  Interventions: Refer to RD note for recommendations, Liberalize Diet, Nepro shake, MVI  Estimated body mass index is 24.31 kg/m as calculated from the following:   Height as of this encounter: 5' 7.99" (1.727 m).   Weight as of this encounter: 72.5 kg.  Code Status: full code.  DVT Prophylaxis:  SCD's Start: 09/09/22 1841   Level of Care: Level of care: Progressive Family Communication: none at bedside.   Disposition Plan:     Remains inpatient appropriate:  pending PT evaluation.   Procedures:  Bleeding anal  ulcer ligation  Consultants:   PCCM,  General surgery.  Vascular surgery.  Gastroenterology.  Neurology. ID  IR  Antimicrobials:   Anti-infectives (From admission, onward)    Start     Dose/Rate Route Frequency Ordered Stop   09/27/22 1200  DAPTOmycin (CUBICIN) 750 mg in sodium chloride 0.9 % IVPB  Status:  Discontinued        750 mg 130 mL/hr over 30 Minutes Intravenous Daily-1800 09/26/22 1733 09/27/22 0924   09/27/22 1200  DAPTOmycin (CUBICIN) 750 mg in sodium chloride 0.9 % IVPB        750 mg 130 mL/hr over 30 Minutes Intravenous  Once 09/27/22 0923 09/27/22 1500   09/26/22 1800  DAPTOmycin (CUBICIN) 750 mg in sodium chloride 0.9 % IVPB        10 mg/kg  75.2 kg 130 mL/hr over 30 Minutes  Intravenous Every Sat (1800) 09/24/22 1342 10/23/22 2359   09/24/22 1800  DAPTOmycin (CUBICIN) 600 mg in sodium chloride 0.9 % IVPB        8 mg/kg  75.2 kg 124 mL/hr over 30 Minutes Intravenous Once per day on Tue Thu 09/24/22 1342 10/23/22 2359   09/19/22 1800  DAPTOmycin (CUBICIN) 750 mg in sodium chloride 0.9 % IVPB  Status:  Discontinued        10 mg/kg  75.2 kg 130 mL/hr over 30 Minutes Intravenous Every Sat (1800) 09/14/22 1622 09/24/22 1342   09/16/22 0830  DAPTOmycin (CUBICIN) 600 mg in sodium chloride 0.9 % IVPB  Status:  Discontinued        8 mg/kg  74.8 kg 124 mL/hr over 30 Minutes Intravenous  Once 09/16/22 0738 09/16/22 1555   09/15/22 1800  DAPTOmycin (CUBICIN) 600 mg in sodium chloride 0.9 % IVPB  Status:  Discontinued        8 mg/kg  75.2 kg 124 mL/hr over 30 Minutes Intravenous Once per day on Tue Thu 09/14/22 1622 09/24/22 1342   09/12/22 1800  DAPTOmycin (CUBICIN) 600 mg in sodium chloride 0.9 % IVPB        600 mg 124 mL/hr over 30 Minutes Intravenous  Once 09/12/22 1049 09/12/22 2033   09/11/22 1615  DAPTOmycin (CUBICIN) 600 mg in sodium chloride 0.9 % IVPB        600 mg 124 mL/hr over 30 Minutes Intravenous  Once 09/11/22 1522 09/11/22 2042   09/10/22 1200  vancomycin (VANCOREADY) IVPB 500 mg/100 mL  Status:  Discontinued        500 mg 100 mL/hr over 60 Minutes Intravenous Every T-Th-Sa (Hemodialysis) 09/09/22 0719 09/10/22 0719   09/10/22 0815  vancomycin (VANCOREADY) IVPB 750 mg/150 mL        750 mg 150 mL/hr over 60 Minutes Intravenous  Once 09/10/22 0719 09/10/22 0939   09/10/22 0718  vancomycin variable dose per unstable renal function (pharmacist dosing)  Status:  Discontinued         Does not apply See admin instructions 09/10/22 0719 09/11/22 1522   09/09/22 2000  piperacillin-tazobactam (ZOSYN) IVPB 2.25 g  Status:  Discontinued        2.25 g 100 mL/hr over 30 Minutes Intravenous Every 8 hours 09/09/22 1849 09/10/22 1003   09/09/22 1145  ceFAZolin  (ANCEF) 2-4 GM/100ML-% IVPB       Note to Pharmacy: Payton Emerald A: cabinet override      09/09/22 1145 09/09/22 2359   09/08/22 0830  vancomycin (VANCOREADY) IVPB 750 mg/150 mL        750  mg 150 mL/hr over 60 Minutes Intravenous  Once 09/08/22 0743 09/08/22 1004   09/07/22 1200  ceFAZolin (ANCEF) IVPB 2g/100 mL premix       Note to Pharmacy: Give in IR for surgical prophylaxis   2 g 200 mL/hr over 30 Minutes Intravenous  Once 09/07/22 1100 09/07/22 1327   09/07/22 1101  ceFAZolin (ANCEF) 2-4 GM/100ML-% IVPB       Note to Pharmacy: Phebe Colla N: cabinet override      09/07/22 1101 09/07/22 1335   09/07/22 0734  vancomycin variable dose per unstable renal function (pharmacist dosing)  Status:  Discontinued         Does not apply See admin instructions 09/07/22 0734 09/09/22 0718   09/05/22 1600  vancomycin (VANCOREADY) IVPB 750 mg/150 mL        750 mg 150 mL/hr over 60 Minutes Intravenous  Once 09/05/22 1505 09/05/22 1717   09/02/22 1227  ceFAZolin (ANCEF) 2-4 GM/100ML-% IVPB       Note to Pharmacy: Isabel Caprice, Destiny: cabinet override      09/02/22 1227 09/03/22 0044   09/02/22 0520  vancomycin variable dose per unstable renal function (pharmacist dosing)  Status:  Discontinued         Does not apply See admin instructions 09/02/22 0521 09/05/22 0940   08/31/22 1800  vancomycin (VANCOREADY) IVPB 750 mg/150 mL  Status:  Discontinued        750 mg 150 mL/hr over 60 Minutes Intravenous Every 24 hours 08/31/22 0822 09/02/22 0521   08/30/22 1800  vancomycin (VANCOREADY) IVPB 1500 mg/300 mL        1,500 mg 150 mL/hr over 120 Minutes Intravenous STAT 08/30/22 1739 08/30/22 2139        Medications  Scheduled Meds:  (feeding supplement) PROSource Plus  30 mL Oral TID WC   sodium chloride   Intravenous Once   acetaminophen  650 mg Oral Q6H   ascorbic acid  250 mg Oral BID   budesonide  9 mg Oral Daily   calcium acetate  1,334 mg Oral TID WC   Chlorhexidine Gluconate Cloth  6 each  Topical Q0600   [START ON 10/01/2022] collagenase   Topical Daily   darbepoetin (ARANESP) injection - DIALYSIS  200 mcg Subcutaneous Q Wed-1800   feeding supplement  1 Container Oral TID BM   liver oil-zinc oxide   Topical TID   mesalamine  1,000 mg Rectal BID   methocarbamol  500 mg Oral TID   multivitamin  1 tablet Oral QHS   nutrition supplement (JUVEN)  1 packet Oral BID BM   pantoprazole  40 mg Oral BID   rosuvastatin  10 mg Oral Daily   sodium chloride flush  10-40 mL Intracatheter Q12H   sodium hypochlorite   Irrigation BID   vitamin A  10,000 Units Oral Daily   Continuous Infusions:  sodium chloride 0 mL/hr at 09/22/22 0757   DAPTOmycin (CUBICIN) 600 mg in sodium chloride 0.9 % IVPB Stopped (09/24/22 1759)   DAPTOmycin (CUBICIN) 750 mg in sodium chloride 0.9 % IVPB Stopped (09/26/22 1731)   sodium chloride     PRN Meds:.alteplase, dicyclomine, heparin, HYDROmorphone (DILAUDID) injection, oxyCODONE, pentafluoroprop-tetrafluoroeth, sodium chloride, traMADol    Subjective:   Albert Harris was seen and examined today.  No new complaints.  Objective:   Vitals:   09/27/22 0846 09/27/22 0847 09/27/22 0926 09/27/22 1424  BP: (!) 129/112  138/86 (!) 129/55  Pulse: 78  87 86  Resp: 18  18 17  Temp: 97.9 F (36.6 C)  98.3 F (36.8 C) 98.3 F (36.8 C)  TempSrc: Oral  Oral Oral  SpO2: 100%  100% 100%  Weight:  72.5 kg    Height:        Intake/Output Summary (Last 24 hours) at 09/27/2022 1548 Last data filed at 09/27/2022 1500 Gross per 24 hour  Intake 303.65 ml  Output 2000 ml  Net -1696.35 ml    Filed Weights   09/26/22 0836 09/27/22 0500 09/27/22 0847  Weight: 68.5 kg 74 kg 72.5 kg     Exam General exam: Appears calm and comfortable  Respiratory system: Clear to auscultation. Respiratory effort normal. Cardiovascular system: S1 & S2 heard, RRR. No JVD,  Gastrointestinal system: Abdomen is nondistended, soft and nontender.  Central nervous system: Alert and  oriented. No focal neurological deficits. Extremities: left groin wound connected to wound vac.  Skin: No rashes,  Psychiatry: Mood & affect appropriate.     Data Reviewed:  I have personally reviewed following labs and imaging studies   CBC Lab Results  Component Value Date   WBC 14.3 (H) 09/25/2022   RBC 3.34 (L) 09/25/2022   HGB 9.1 (L) 09/26/2022   HCT 26.6 (L) 09/26/2022   MCV 89.5 09/25/2022   MCH 30.2 09/25/2022   PLT 119 (L) 09/25/2022   MCHC 33.8 09/25/2022   RDW 18.6 (H) 09/25/2022   LYMPHSABS 1.5 08/28/2022   MONOABS 0.7 08/28/2022   EOSABS 0.1 08/28/2022   BASOSABS 0.1 08/28/2022     Last metabolic panel Lab Results  Component Value Date   NA 135 09/27/2022   K 4.3 09/27/2022   CL 97 (L) 09/27/2022   CO2 24 09/27/2022   BUN 61 (H) 09/27/2022   CREATININE 7.14 (H) 09/27/2022   GLUCOSE 85 09/27/2022   GFRNONAA 8 (L) 09/27/2022   GFRAA 6 (L) 03/04/2020   CALCIUM 7.7 (L) 09/27/2022   PHOS 6.1 (H) 09/27/2022   PROT 4.5 (L) 09/24/2022   ALBUMIN <1.5 (L) 09/27/2022   LABGLOB 2.8 06/18/2021   AGRATIO 1.3 06/18/2021   BILITOT 1.6 (H) 09/24/2022   ALKPHOS 92 09/24/2022   AST 19 09/24/2022   ALT 11 09/24/2022   ANIONGAP 14 09/27/2022    CBG (last 3)  Recent Labs    09/25/22 0749 09/25/22 1109 09/25/22 1546  GLUCAP 79 92 112*       Coagulation Profile: No results for input(s): "INR", "PROTIME" in the last 168 hours.   Radiology Studies: No results found.     Kathlen Mody M.D. [facility] 09/27/2022, 3:48 PM  Available via Epic secure chat 7am-7pm After 7 pm, please refer to night coverage provider listed on amion.

## 2022-09-27 NOTE — Progress Notes (Signed)
Patient back to 4E from dialysis. VSS. Call bell in reach.    09/27/22 0926  Vitals  Temp 98.3 F (36.8 C)  Temp Source Oral  BP 138/86  MAP (mmHg) 102  BP Location Left Arm  BP Method Automatic  Patient Position (if appropriate) Lying  Pulse Rate 87  Pulse Rate Source Monitor  ECG Heart Rate 87  Resp 18  Level of Consciousness  Level of Consciousness Alert  MEWS COLOR  MEWS Score Color Green  Oxygen Therapy  SpO2 100 %  O2 Device Room Air  MEWS Score  MEWS Temp 0  MEWS Systolic 0  MEWS Pulse 0  MEWS RR 0  MEWS LOC 0  MEWS Score 0

## 2022-09-27 NOTE — Progress Notes (Signed)
Wound vac with air leak message. Tegaderm applied to left groin around wound vac track. PA paged for advice. Awaiting response.  Kenard Gower, RN

## 2022-09-27 NOTE — Progress Notes (Signed)
Received patient in bed to unit.  Alert and oriented x4 Informed consent signed and in chart 09/02/22  TX duration:3.5 hours  Patient tolerated well.  Transported back to the room  Alert, without acute distress.  Hand-off given to patient's nurse.   Access used: dialysis cath Access issues: line was reversed.   Total UF removed: 2000L Medication(s) given: none Post HD VS: see table below Post HD weight: 72.5 kg   09/27/22 0846  Vitals  Temp 97.9 F (36.6 C)  Temp Source Oral  BP (!) 129/112  MAP (mmHg) 120  BP Location Left Arm  BP Method Automatic  Patient Position (if appropriate) Lying  Pulse Rate 78  Pulse Rate Source Monitor  ECG Heart Rate 78  Resp 18  Oxygen Therapy  SpO2 100 %  O2 Device Room Air  Patient Activity (if Appropriate) In bed  Pulse Oximetry Type Continuous  During Treatment Monitoring  HD Safety Checks Performed Yes  Intra-Hemodialysis Comments Tolerated well  Post Treatment  Dialyzer Clearance Lightly streaked  Duration of HD Treatment -hour(s) 3.5 hour(s)  Hemodialysis Intake (mL) 0 mL  Liters Processed 83.3  Fluid Removed (mL) 2000 mL  Tolerated HD Treatment Yes  Post-Hemodialysis Comments goal met  Fistula / Graft Right Forearm Arteriovenous vein graft  No placement date or time found.   Placed prior to admission: Yes  Orientation: Right  Access Location: Forearm  Access Type: Arteriovenous vein graft  Site Condition No complications  Fistula / Graft Assessment Present;Thrill;Bruit  Hemodialysis Catheter Right Internal jugular Double lumen Permanent (Tunneled)  Placement Date/Time: 09/07/22 1352   Placed prior to admission: No  Serial / Lot #: [id number]  Expiration Date: 03/10/27  Time Out: Correct patient;Correct site;Correct procedure  Maximum sterile barrier precautions: Hand hygiene;Cap;Mask;Large steri...  Site Condition No complications  Blue Lumen Status Flushed;Heparin locked;Dead end cap in place  Red Lumen Status  Flushed;Heparin locked;Dead end cap in place  Purple Lumen Status N/A  Catheter fill solution Heparin 1000 units/ml  Catheter fill volume (Arterial) 1.6 cc  Catheter fill volume (Venous) 1.6  Post treatment catheter status Capped and Clamped      [facility]

## 2022-09-27 NOTE — Progress Notes (Signed)
Pt BP 176/73. Margo Aye, MD notified New order placed.

## 2022-09-27 NOTE — TOC Progression Note (Signed)
Transition of Care [facility]) - Progression Note    Patient Details  Name: Albert Harris MRN: [id number] Date of Birth: 1955-10-03  [facility] of [facility]) CM/SW Contact  Leander Rams, LCSW Phone Number: 09/27/2022, 12:45 PM  Clinical Narrative:    CSW spoke with pt daughter [name] who was present in hospital room with pt. CSW discuss bed offers. [name] stated that she is not in favor of the current SNF offers. Daughter requested [facility] and referrals be sent out facilities in [place].   CSW reached out to [facility] regarding referral and sent additional referrals to SNF's in [place]. TOC will continue to follow.    Expected Discharge Plan: Skilled Nursing Facility Barriers to Discharge: Continued Medical Work up  Expected Discharge Plan and Services In-house Referral: Clinical Social Work   Post Acute Care Choice: Skilled Nursing Facility Living arrangements for the past 2 months: Single Family Home                                       Social Determinants of Health (SDOH) Interventions SDOH Screenings   Food Insecurity: Food Insecurity Present (08/22/2022)  Housing: Low Risk  (08/22/2022)  Transportation Needs: Unmet Transportation Needs (08/22/2022)  Utilities: Not At Risk (08/22/2022)  Alcohol Screen: Low Risk  (09/12/2019)  Depression (PHQ2-9): High Risk (05/07/2022)  Financial Resource Strain: Low Risk  (09/12/2019)  Stress: No Stress Concern Present (09/12/2019)  Tobacco Use: Medium Risk (09/26/2022)    Readmission Risk Interventions    08/24/2022   11:27 AM 08/14/2022   10:07 AM 07/24/2022   11:52 AM  Readmission Risk Prevention Plan  Transportation Screening Complete Complete Complete  Medication Review (RN Care Manager) Referral to Pharmacy Referral to Pharmacy Complete  PCP or Specialist appointment within 3-5 days of discharge  Complete Complete  HRI or Home Care Consult Complete Patient refused Complete  SW Recovery Care/Counseling Consult Complete  Complete Patient refused  Palliative Care Screening Not Applicable Not Applicable Not Applicable  Skilled Nursing Facility Not Applicable Not Applicable Not Applicable   Oletta Lamas, MSW, LCSWA, LCASA Transitions of Care  Clinical Social Worker I

## 2022-09-27 NOTE — Progress Notes (Signed)
1 Day Post-Op   Subjective/Chief Complaint: Back from dialysis No rectal bleeding since surgery yesterday   Objective: Vital signs in last 24 hours: Temp:  [97.6 F (36.4 C)-98.3 F (36.8 C)] 98.3 F (36.8 C) (04/21 0926) Pulse Rate:  [66-136] 87 (04/21 0926) Resp:  [13-18] 18 (04/21 0926) BP: (112-186)/(62-112) 138/86 (04/21 0926) SpO2:  [90 %-100 %] 100 % (04/21 0926) Weight:  [72.5 kg-74 kg] 72.5 kg (04/21 0847) Last BM Date : 09/26/22  Intake/Output from previous day: 04/20 0701 - 04/21 0700 In: 510 [P.O.:360; I.V.:150] Out: 50 [Blood:50] Intake/Output this shift: Total I/O In: -  Out: 2000 [Other:2000]  Exam; Awake and alert Rectal exam deferred to leave gelfoam in place  Lab Results:  Recent Labs    09/25/22 0509 09/25/22 2240 09/26/22 0912 09/26/22 1715  WBC 14.3*  --   --   --   HGB 10.1*   < > 9.5* 9.1*  HCT 29.9*   < > 28.0* 26.6*  PLT 119*  --   --   --    < > = values in this interval not displayed.   BMET Recent Labs    09/26/22 0217 09/26/22 0912 09/27/22 0230  NA 133* 136 135  K 3.8 3.9 4.3  CL 95* 97* 97*  CO2 25  --  24  GLUCOSE 109* 94 85  BUN 48* 44* 61*  CREATININE 5.59* 6.50* 7.14*  CALCIUM 7.8*  --  7.7*   PT/INR No results for input(s): "LABPROT", "INR" in the last 72 hours. ABG No results for input(s): "PHART", "HCO3" in the last 72 hours.  Invalid input(s): "PCO2", "PO2"  Studies/Results: No results found.  Anti-infectives: Anti-infectives (From admission, onward)    Start     Dose/Rate Route Frequency Ordered Stop   09/27/22 1200  DAPTOmycin (CUBICIN) 750 mg in sodium chloride 0.9 % IVPB  Status:  Discontinued        750 mg 130 mL/hr over 30 Minutes Intravenous Daily-1800 09/26/22 1733 09/27/22 0924   09/27/22 1200  DAPTOmycin (CUBICIN) 750 mg in sodium chloride 0.9 % IVPB        750 mg 130 mL/hr over 30 Minutes Intravenous  Once 09/27/22 0923     09/26/22 1800  DAPTOmycin (CUBICIN) 750 mg in sodium chloride  0.9 % IVPB        10 mg/kg  75.2 kg 130 mL/hr over 30 Minutes Intravenous Every Sat (1800) 09/24/22 1342 10/23/22 2359   09/24/22 1800  DAPTOmycin (CUBICIN) 600 mg in sodium chloride 0.9 % IVPB        8 mg/kg  75.2 kg 124 mL/hr over 30 Minutes Intravenous Once per day on Tue Thu 09/24/22 1342 10/23/22 2359   09/19/22 1800  DAPTOmycin (CUBICIN) 750 mg in sodium chloride 0.9 % IVPB  Status:  Discontinued        10 mg/kg  75.2 kg 130 mL/hr over 30 Minutes Intravenous Every Sat (1800) 09/14/22 1622 09/24/22 1342   09/16/22 0830  DAPTOmycin (CUBICIN) 600 mg in sodium chloride 0.9 % IVPB  Status:  Discontinued        8 mg/kg  74.8 kg 124 mL/hr over 30 Minutes Intravenous  Once 09/16/22 0738 09/16/22 1555   09/15/22 1800  DAPTOmycin (CUBICIN) 600 mg in sodium chloride 0.9 % IVPB  Status:  Discontinued        8 mg/kg  75.2 kg 124 mL/hr over 30 Minutes Intravenous Once per day on Tue Thu 09/14/22 1622 09/24/22 1342  09/12/22 1800  DAPTOmycin (CUBICIN) 600 mg in sodium chloride 0.9 % IVPB        600 mg 124 mL/hr over 30 Minutes Intravenous  Once 09/12/22 1049 09/12/22 2033   09/11/22 1615  DAPTOmycin (CUBICIN) 600 mg in sodium chloride 0.9 % IVPB        600 mg 124 mL/hr over 30 Minutes Intravenous  Once 09/11/22 1522 09/11/22 2042   09/10/22 1200  vancomycin (VANCOREADY) IVPB 500 mg/100 mL  Status:  Discontinued        500 mg 100 mL/hr over 60 Minutes Intravenous Every T-Th-Sa (Hemodialysis) 09/09/22 0719 09/10/22 0719   09/10/22 0815  vancomycin (VANCOREADY) IVPB 750 mg/150 mL        750 mg 150 mL/hr over 60 Minutes Intravenous  Once 09/10/22 0719 09/10/22 0939   09/10/22 0718  vancomycin variable dose per unstable renal function (pharmacist dosing)  Status:  Discontinued         Does not apply See admin instructions 09/10/22 0719 09/11/22 1522   09/09/22 2000  piperacillin-tazobactam (ZOSYN) IVPB 2.25 g  Status:  Discontinued        2.25 g 100 mL/hr over 30 Minutes Intravenous Every 8  hours 09/09/22 1849 09/10/22 1003   09/09/22 1145  ceFAZolin (ANCEF) 2-4 GM/100ML-% IVPB       Note to Pharmacy: Payton Emerald A: cabinet override      09/09/22 1145 09/09/22 2359   09/08/22 0830  vancomycin (VANCOREADY) IVPB 750 mg/150 mL        750 mg 150 mL/hr over 60 Minutes Intravenous  Once 09/08/22 0743 09/08/22 1004   09/07/22 1200  ceFAZolin (ANCEF) IVPB 2g/100 mL premix       Note to Pharmacy: Give in IR for surgical prophylaxis   2 g 200 mL/hr over 30 Minutes Intravenous  Once 09/07/22 1100 09/07/22 1327   09/07/22 1101  ceFAZolin (ANCEF) 2-4 GM/100ML-% IVPB       Note to Pharmacy: Phebe Colla N: cabinet override      09/07/22 1101 09/07/22 1335   09/07/22 0734  vancomycin variable dose per unstable renal function (pharmacist dosing)  Status:  Discontinued         Does not apply See admin instructions 09/07/22 0734 09/09/22 0718   09/05/22 1600  vancomycin (VANCOREADY) IVPB 750 mg/150 mL        750 mg 150 mL/hr over 60 Minutes Intravenous  Once 09/05/22 1505 09/05/22 1717   09/02/22 1227  ceFAZolin (ANCEF) 2-4 GM/100ML-% IVPB       Note to Pharmacy: Isabel Caprice, Destiny: cabinet override      09/02/22 1227 09/03/22 0044   09/02/22 0520  vancomycin variable dose per unstable renal function (pharmacist dosing)  Status:  Discontinued         Does not apply See admin instructions 09/02/22 0521 09/05/22 0940   08/31/22 1800  vancomycin (VANCOREADY) IVPB 750 mg/150 mL  Status:  Discontinued        750 mg 150 mL/hr over 60 Minutes Intravenous Every 24 hours 08/31/22 0822 09/02/22 0521   08/30/22 1800  vancomycin (VANCOREADY) IVPB 1500 mg/300 mL        1,500 mg 150 mL/hr over 120 Minutes Intravenous STAT 08/30/22 1739 08/30/22 2139       Assessment/Plan: Rectal/anal bleeding from ulcers with multiple significant co morbidities S/p EUA and over sewing of ulcers and packing anal canal with gelfoam  No other surgical options if patient rebleeds   Abigail Miyamoto  MD 09/27/2022

## 2022-09-27 NOTE — Plan of Care (Signed)
  Problem: Bowel/Gastric: Goal: Gastrointestinal status for postoperative course will improve Outcome: Progressing   

## 2022-09-28 ENCOUNTER — Inpatient Hospital Stay: Payer: Medicare Other | Admitting: Internal Medicine

## 2022-09-28 DIAGNOSIS — K633 Ulcer of intestine: Secondary | ICD-10-CM | POA: Diagnosis not present

## 2022-09-28 DIAGNOSIS — K625 Hemorrhage of anus and rectum: Secondary | ICD-10-CM | POA: Diagnosis not present

## 2022-09-28 DIAGNOSIS — A4102 Sepsis due to Methicillin resistant Staphylococcus aureus: Secondary | ICD-10-CM | POA: Diagnosis not present

## 2022-09-28 DIAGNOSIS — D62 Acute posthemorrhagic anemia: Secondary | ICD-10-CM | POA: Diagnosis not present

## 2022-09-28 DIAGNOSIS — K626 Ulcer of anus and rectum: Secondary | ICD-10-CM | POA: Diagnosis not present

## 2022-09-28 DIAGNOSIS — N186 End stage renal disease: Secondary | ICD-10-CM | POA: Diagnosis not present

## 2022-09-28 LAB — CBC WITH DIFFERENTIAL/PLATELET
Abs Immature Granulocytes: 0.09 10*3/uL — ABNORMAL HIGH (ref 0.00–0.07)
Basophils Absolute: 0 10*3/uL (ref 0.0–0.1)
Basophils Relative: 0 %
Eosinophils Absolute: 0.2 10*3/uL (ref 0.0–0.5)
Eosinophils Relative: 1 %
HCT: 24.9 % — ABNORMAL LOW (ref 39.0–52.0)
Hemoglobin: 8.1 g/dL — ABNORMAL LOW (ref 13.0–17.0)
Immature Granulocytes: 1 %
Lymphocytes Relative: 8 %
Lymphs Abs: 1 10*3/uL (ref 0.7–4.0)
MCH: 30.7 pg (ref 26.0–34.0)
MCHC: 32.5 g/dL (ref 30.0–36.0)
MCV: 94.3 fL (ref 80.0–100.0)
Monocytes Absolute: 0.9 10*3/uL (ref 0.1–1.0)
Monocytes Relative: 7 %
Neutro Abs: 10.7 10*3/uL — ABNORMAL HIGH (ref 1.7–7.7)
Neutrophils Relative %: 83 %
Platelets: 159 10*3/uL (ref 150–400)
RBC: 2.64 MIL/uL — ABNORMAL LOW (ref 4.22–5.81)
RDW: 18.6 % — ABNORMAL HIGH (ref 11.5–15.5)
WBC: 12.8 10*3/uL — ABNORMAL HIGH (ref 4.0–10.5)
nRBC: 0 % (ref 0.0–0.2)

## 2022-09-28 LAB — RENAL FUNCTION PANEL
Albumin: <1.5 g/dL — ABNORMAL LOW (ref 3.5–5.0)
Anion gap: 12 (ref 5–15)
BUN: 36 mg/dL — ABNORMAL HIGH (ref 8–23)
CO2: 27 mmol/L (ref 22–32)
Calcium: 7.4 mg/dL — ABNORMAL LOW (ref 8.9–10.3)
Chloride: 97 mmol/L — ABNORMAL LOW (ref 98–111)
Creatinine, Ser: 4.69 mg/dL — ABNORMAL HIGH (ref 0.61–1.24)
GFR, Estimated: 13 mL/min — ABNORMAL LOW (ref >60)
Glucose, Bld: 101 mg/dL — ABNORMAL HIGH (ref 70–99)
Phosphorus: 3.7 mg/dL (ref 2.5–4.6)
Potassium: 3.6 mmol/L (ref 3.5–5.1)
Sodium: 136 mmol/L (ref 135–145)

## 2022-09-28 LAB — CK: Total CK: 18 U/L — ABNORMAL LOW (ref 49–397)

## 2022-09-28 LAB — GLUCOSE, CAPILLARY: Glucose-Capillary: 99 mg/dL (ref 70–99)

## 2022-09-28 MED ORDER — METOPROLOL SUCCINATE ER 25 MG PO TB24
12.5000 mg | ORAL_TABLET | ORAL | Status: AC
  Administered 2022-09-28: 12.5 mg via ORAL
  Filled 2022-09-28: qty 1

## 2022-09-28 MED ORDER — METOPROLOL TARTRATE 12.5 MG HALF TABLET: 12.5000 mg | ORAL_TABLET | ORAL | Status: DC

## 2022-09-28 MED ORDER — HYDROMORPHONE HCL 1 MG/ML IJ SOLN
1.0000 mg | INTRAMUSCULAR | Status: DC | PRN
  Administered 2022-09-28 – 2022-10-14 (×51): 1 mg via INTRAVENOUS
  Filled 2022-09-28 (×28): qty 1
  Filled 2022-09-28: qty 0.5
  Filled 2022-09-28 (×8): qty 1
  Filled 2022-09-28: qty 0.5
  Filled 2022-09-28 (×14): qty 1

## 2022-09-28 MED ORDER — OXYCODONE HCL 5 MG PO TABS
5.0000 mg | ORAL_TABLET | ORAL | Status: DC | PRN
  Administered 2022-09-28 – 2022-09-29 (×3): 5 mg via ORAL
  Filled 2022-09-28 (×3): qty 1

## 2022-09-28 MED ORDER — CHLORHEXIDINE GLUCONATE CLOTH 2 % EX PADS: 6.0000 | MEDICATED_PAD | Freq: Every day | CUTANEOUS | Status: DC

## 2022-09-28 MED ORDER — METOPROLOL SUCCINATE ER 25 MG PO TB24
25.0000 mg | ORAL_TABLET | Freq: Every day | ORAL | Status: DC
  Administered 2022-09-28 – 2022-10-21 (×23): 25 mg via ORAL
  Filled 2022-09-28 (×23): qty 1

## 2022-09-28 MED ORDER — POLYETHYLENE GLYCOL 3350 17 G PO PACK
17.0000 g | PACK | Freq: Every day | ORAL | Status: DC
  Administered 2022-09-28 – 2022-10-18 (×3): 17 g via ORAL
  Filled 2022-09-28 (×9): qty 1

## 2022-09-28 NOTE — Progress Notes (Signed)
[facility] Progress Note   Subjective:    BP highish overnight -  no particular intervention but latest BP is better - he is alert, c/o pain in his backside  Objective Vitals:   09/28/22 0234 09/28/22 0312 09/28/22 0436 09/28/22 0845  BP: (!) 184/73 (!) 176/75  136/62  Pulse: 73 74 73   Resp: 18 19 17    Temp:  98.3 F (36.8 C)  98.2 F (36.8 C)  TempSrc:  Oral  Oral  SpO2: 95% 98% 98%   Weight:      Height:       Physical Exam General: Awake, alert,  On RA, NAD Heart: RRR; no murmur Lungs: CTA anteriorly Abdomen: soft Extremities: 1+ bilateral LE edema; L foot with ischemic distal 3rd-5th toes Dialysis Access: [facility]  Filed Weights   09/26/22 0836 09/27/22 0500 09/27/22 0847  Weight: 68.5 kg 74 kg 72.5 kg    Intake/Output Summary (Last 24 hours) at 09/28/2022 0925 Last data filed at 09/27/2022 1952 Gross per 24 hour  Intake 543.65 ml  Output 0 ml  Net 543.65 ml    Additional Objective Labs: Basic Metabolic Panel: Recent Labs  Lab 09/26/22 0217 09/26/22 0912 09/27/22 0230 09/28/22 0340  NA 133* 136 135 136  K 3.8 3.9 4.3 3.6  CL 95* 97* 97* 97*  CO2 25  --  24 27  GLUCOSE 109* 94 85 101*  BUN 48* 44* 61* 36*  CREATININE 5.59* 6.50* 7.14* 4.69*  CALCIUM 7.8*  --  7.7* 7.4*  PHOS 4.1  --  6.1* 3.7   Liver Function Tests: Recent Labs  Lab 09/24/22 0400 09/25/22 0509 09/26/22 0217 09/27/22 0230 09/28/22 0340  AST 19  --   --   --   --   ALT 11  --   --   --   --   ALKPHOS 92  --   --   --   --   BILITOT 1.6*  --   --   --   --   PROT 4.5*  --   --   --   --   ALBUMIN 1.7*   < > 1.6* <1.5* <1.5*   < > = values in this interval not displayed.   No results for input(s): "LIPASE", "AMYLASE" in the last 168 hours. CBC: Recent Labs  Lab 09/22/22 2038 09/23/22 0317 09/24/22 0318 09/24/22 0400 09/25/22 0509 09/25/22 2240 09/26/22 0912 09/26/22 1715 09/28/22 0340  WBC 20.7* 16.9*  --  21.0* 14.3*  --   --   --  12.8*  NEUTROABS   --   --   --   --   --   --   --   --  10.7*  HGB 11.2* 10.6*   < > 11.6* 10.1*   < > 9.5* 9.1* 8.1*  HCT 34.9* 31.9*   < > 34.2* 29.9*   < > 28.0* 26.6* 24.9*  MCV 92.6 90.6  --  90.0 89.5  --   --   --  94.3  PLT 186 188  --  179 119*  --   --   --  159   < > = values in this interval not displayed.   Blood Culture    Component Value Date/Time   SDES TISSUE 09/09/2022 1453   SPECREQUEST VESSEL PT ON VANC ANCEF 09/09/2022 1453   CULT  09/09/2022 1453    RARE STAPHYLOCOCCUS AUREUS SUSCEPTIBILITIES PERFORMED ON PREVIOUS CULTURE WITHIN THE LAST 5 DAYS. NO  ANAEROBES ISOLATED Performed at [facility], [address], [place], [place] [address]    REPTSTATUS 09/14/2022 FINAL 09/09/2022 1453    Cardiac Enzymes: Recent Labs  Lab 09/22/22 0331 09/28/22 0340  CKTOTAL 26* 18*   CBG: Recent Labs  Lab 09/24/22 2322 09/25/22 0335 09/25/22 0749 09/25/22 1109 09/25/22 1546  GLUCAP 87 82 79 92 112*   Iron Studies: No results for input(s): "IRON", "TIBC", "TRANSFERRIN", "FERRITIN" in the last 72 hours. Lab Results  Component Value Date   INR 1.2 08/12/2022   INR 1.3 (H) 06/29/2022   INR 1.3 (H) 06/28/2022   Studies/Results: No results found.  Medications:  sodium chloride 0 mL/hr at 09/22/22 0757   DAPTOmycin (CUBICIN) 600 mg in sodium chloride 0.9 % IVPB Stopped (09/24/22 1759)   DAPTOmycin (CUBICIN) 750 mg in sodium chloride 0.9 % IVPB Stopped (09/26/22 1731)   sodium chloride      (feeding supplement) PROSource Plus  30 mL Oral TID WC   sodium chloride   Intravenous Once   acetaminophen  650 mg Oral Q6H   ascorbic acid  250 mg Oral BID   budesonide  9 mg Oral Daily   calcium acetate  1,334 mg Oral TID WC   Chlorhexidine Gluconate Cloth  6 each Topical Q0600   [START ON 10/01/2022] collagenase   Topical Daily   darbepoetin (ARANESP) injection - DIALYSIS  200 mcg Subcutaneous Q Wed-1800   feeding supplement  1 Container Oral TID BM   liver oil-zinc oxide    Topical TID   mesalamine  1,000 mg Rectal BID   methocarbamol  500 mg Oral TID   metoprolol succinate  25 mg Oral Daily   multivitamin  1 tablet Oral QHS   nutrition supplement (JUVEN)  1 packet Oral BID BM   pantoprazole  40 mg Oral BID   rosuvastatin  10 mg Oral Daily   sodium chloride flush  10-40 mL Intracatheter Q12H   sodium hypochlorite   Irrigation BID   vitamin A  10,000 Units Oral Daily    Dialysis Orders: TTS SW  4h   350/800  64.5kg  RFA AVG -> clotted/ new TDC in place Heparin none  - last OP HD 3/14, post wt 65.9kg - venofer  IV weekly - rocaltrol 0.25 mcg po tiw - phoslo 2 ac tid - no esa, last Hb 11.0  Assessment/Plan: Recurrent lower GIB: S/p colonoscopy 09/15/22, then repeat sigmoidoscopy 4/11. Several bleeding ulcers addressed with endoscopy.  Path came back + Crohn's - started on mesalamine by GI. Brisk re-bleed 4/18, back for emergent sigmoidoscopy with hemostatic gel applied to bleeding ulcer. Patient started bleeding again from his rectum/ anus -s/p suturing of bleeding ulcers 4/20 by Dr. Magnus Ivan Left common femoral artery pseudoaneurysm - s/p L femoral exploration with pseudoaneurysm repair 08/29/22, then iliofemoral bypass, removal of infected patch with sartorius muscle flap on 09/09/22. Ischemic changes to L 3rd-5th toes - still high risk of limb loss.   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  CVA: + stroke on MRI. Neuro signed off.  ESRD: Continue HD on TTS schedule, did require short-term CRRT 3/23- 3/26. Received HD 4/21-2L removed. Next HD 4/23 per his usual schedule. BP/volume: Variable, 1+ edema on exam. Push UF as tolerated Anemia (ESRD + ABLA): Last transfused 1U on 4/20 (that brings total to 24U this admit). Also on Aranesp q Wed (max  dose).  Secondary HPTH: CorrCa high but now at goal, Phos ok. Continue holding VDRA for now. Will work to  get him off  Phoslo as outpatient - can continue for now. Dialysis access: AVG clotted 3/27, went for declot by VVS and then re-clotted. VVS placed R TDC on 4/1.  Albumin less than 1.5-  30 days of hosp today -  has palliative care seen him at all ? Need to talk about at least DNR  Cecille Aver  [facility] 09/28/2022,9:25 AM  LOS: 31 days

## 2022-09-28 NOTE — TOC Progression Note (Signed)
Transition of Care [facility]) - Progression Note    Patient Details  Name: Albert Harris MRN: [id number] Date of Birth: [birth year]  Transition of Care [facility] CM/SW Contact  Eduard Roux, [place] Phone Number: 09/28/2022, 11:39 AM  Clinical Narrative:     CSW met with patient's daughter, [name] in the room with the patient.  Confirmed disposition plan to d/c to SNF ( PC to follow ) and plans to return home w/ spouse after rehab. She states her preferred SNF is [facility]. She was agreeable to sending referrals to other SNFs in the area.   CSW updated [facility] in [place] patient will be discharging to SNF and then home. Hospice involvement is not needed at this time.   Antony Blackbird, MSW, LCSW Clinical Social Worker    Expected Discharge Plan: Skilled Nursing Facility Barriers to Discharge: Continued Medical Work up  Expected Discharge Plan and Services In-house Referral: Clinical Social Work   Post Acute Care Choice: Skilled Nursing Facility Living arrangements for the past 2 months: Single Family Home                                       Social Determinants of Health (SDOH) Interventions SDOH Screenings   Food Insecurity: Food Insecurity Present (08/22/2022)  Housing: Low Risk  (08/22/2022)  Transportation Needs: Unmet Transportation Needs (08/22/2022)  Utilities: Not At Risk (08/22/2022)  Alcohol Screen: Low Risk  (09/12/2019)  Depression (PHQ2-9): High Risk (05/07/2022)  Financial Resource Strain: Low Risk  (09/12/2019)  Stress: No Stress Concern Present (09/12/2019)  Tobacco Use: Medium Risk (09/26/2022)    Readmission Risk Interventions    08/24/2022   11:27 AM 08/14/2022   10:07 AM 07/24/2022   11:52 AM  Readmission Risk Prevention Plan  Transportation Screening Complete Complete Complete  Medication Review (RN Care Manager) Referral to Pharmacy Referral to Pharmacy Complete  PCP or Specialist appointment within 3-5 days of discharge  Complete Complete  HRI or  Home Care Consult Complete Patient refused Complete  SW Recovery Care/Counseling Consult Complete Complete Patient refused  Palliative Care Screening Not Applicable Not Applicable Not Applicable  Skilled Nursing Facility Not Applicable Not Applicable Not Applicable

## 2022-09-28 NOTE — Progress Notes (Signed)
2 Days Post-Op   Subjective/Chief Complaint: No rectal bleeding since surgery Saturday. Feels well   Objective: Vital signs in last 24 hours: Temp:  [98.2 F (36.8 C)-98.6 F (37 C)] 98.2 F (36.8 C) (04/22 0845) Pulse Rate:  [73-86] 73 (04/22 0436) Resp:  [16-19] 17 (04/22 0436) BP: (129-185)/(55-78) 136/62 (04/22 0845) SpO2:  [95 %-100 %] 98 % (04/22 0436) Last BM Date : 09/26/22  Intake/Output from previous day: 04/21 0701 - 04/22 0700 In: 543.7 [P.O.:480; IV Piggyback:63.7] Out: 2000  Intake/Output this shift: No intake/output data recorded.  Exam; Awake and alert No bleeding on pads  Lab Results:  Recent Labs    09/26/22 1715 09/28/22 0340  WBC  --  12.8*  HGB 9.1* 8.1*  HCT 26.6* 24.9*  PLT  --  159   BMET Recent Labs    09/27/22 0230 09/28/22 0340  NA 135 136  K 4.3 3.6  CL 97* 97*  CO2 24 27  GLUCOSE 85 101*  BUN 61* 36*  CREATININE 7.14* 4.69*  CALCIUM 7.7* 7.4*   PT/INR No results for input(s): "LABPROT", "INR" in the last 72 hours. ABG No results for input(s): "PHART", "HCO3" in the last 72 hours.  Invalid input(s): "PCO2", "PO2"  Studies/Results: No results found.  Anti-infectives: Anti-infectives (From admission, onward)    Start     Dose/Rate Route Frequency Ordered Stop   09/27/22 1200  DAPTOmycin (CUBICIN) 750 mg in sodium chloride 0.9 % IVPB  Status:  Discontinued        750 mg 130 mL/hr over 30 Minutes Intravenous Daily-1800 09/26/22 1733 09/27/22 0924   09/27/22 1200  DAPTOmycin (CUBICIN) 750 mg in sodium chloride 0.9 % IVPB        750 mg 130 mL/hr over 30 Minutes Intravenous  Once 09/27/22 0923 09/27/22 1500   09/26/22 1800  DAPTOmycin (CUBICIN) 750 mg in sodium chloride 0.9 % IVPB        10 mg/kg  75.2 kg 130 mL/hr over 30 Minutes Intravenous Every Sat (1800) 09/24/22 1342 10/23/22 2359   09/24/22 1800  DAPTOmycin (CUBICIN) 600 mg in sodium chloride 0.9 % IVPB        8 mg/kg  75.2 kg 124 mL/hr over 30 Minutes  Intravenous Once per day on Tue Thu 09/24/22 1342 10/23/22 2359   09/19/22 1800  DAPTOmycin (CUBICIN) 750 mg in sodium chloride 0.9 % IVPB  Status:  Discontinued        10 mg/kg  75.2 kg 130 mL/hr over 30 Minutes Intravenous Every Sat (1800) 09/14/22 1622 09/24/22 1342   09/16/22 0830  DAPTOmycin (CUBICIN) 600 mg in sodium chloride 0.9 % IVPB  Status:  Discontinued        8 mg/kg  74.8 kg 124 mL/hr over 30 Minutes Intravenous  Once 09/16/22 0738 09/16/22 1555   09/15/22 1800  DAPTOmycin (CUBICIN) 600 mg in sodium chloride 0.9 % IVPB  Status:  Discontinued        8 mg/kg  75.2 kg 124 mL/hr over 30 Minutes Intravenous Once per day on Tue Thu 09/14/22 1622 09/24/22 1342   09/12/22 1800  DAPTOmycin (CUBICIN) 600 mg in sodium chloride 0.9 % IVPB        600 mg 124 mL/hr over 30 Minutes Intravenous  Once 09/12/22 1049 09/12/22 2033   09/11/22 1615  DAPTOmycin (CUBICIN) 600 mg in sodium chloride 0.9 % IVPB        600 mg 124 mL/hr over 30 Minutes Intravenous  Once 09/11/22 1522  09/11/22 2042   09/10/22 1200  vancomycin (VANCOREADY) IVPB 500 mg/100 mL  Status:  Discontinued        500 mg 100 mL/hr over 60 Minutes Intravenous Every T-Th-Sa (Hemodialysis) 09/09/22 0719 09/10/22 0719   09/10/22 0815  vancomycin (VANCOREADY) IVPB 750 mg/150 mL        750 mg 150 mL/hr over 60 Minutes Intravenous  Once 09/10/22 0719 09/10/22 0939   09/10/22 0718  vancomycin variable dose per unstable renal function (pharmacist dosing)  Status:  Discontinued         Does not apply See admin instructions 09/10/22 0719 09/11/22 1522   09/09/22 2000  piperacillin-tazobactam (ZOSYN) IVPB 2.25 g  Status:  Discontinued        2.25 g 100 mL/hr over 30 Minutes Intravenous Every 8 hours 09/09/22 1849 09/10/22 1003   09/09/22 1145  ceFAZolin (ANCEF) 2-4 GM/100ML-% IVPB       Note to Pharmacy: Payton Emerald A: cabinet override      09/09/22 1145 09/09/22 2359   09/08/22 0830  vancomycin (VANCOREADY) IVPB 750 mg/150 mL         750 mg 150 mL/hr over 60 Minutes Intravenous  Once 09/08/22 0743 09/08/22 1004   09/07/22 1200  ceFAZolin (ANCEF) IVPB 2g/100 mL premix       Note to Pharmacy: Give in IR for surgical prophylaxis   2 g 200 mL/hr over 30 Minutes Intravenous  Once 09/07/22 1100 09/07/22 1327   09/07/22 1101  ceFAZolin (ANCEF) 2-4 GM/100ML-% IVPB       Note to Pharmacy: Phebe Colla N: cabinet override      09/07/22 1101 09/07/22 1335   09/07/22 0734  vancomycin variable dose per unstable renal function (pharmacist dosing)  Status:  Discontinued         Does not apply See admin instructions 09/07/22 0734 09/09/22 0718   09/05/22 1600  vancomycin (VANCOREADY) IVPB 750 mg/150 mL        750 mg 150 mL/hr over 60 Minutes Intravenous  Once 09/05/22 1505 09/05/22 1717   09/02/22 1227  ceFAZolin (ANCEF) 2-4 GM/100ML-% IVPB       Note to Pharmacy: Isabel Caprice, Destiny: cabinet override      09/02/22 1227 09/03/22 0044   09/02/22 0520  vancomycin variable dose per unstable renal function (pharmacist dosing)  Status:  Discontinued         Does not apply See admin instructions 09/02/22 0521 09/05/22 0940   08/31/22 1800  vancomycin (VANCOREADY) IVPB 750 mg/150 mL  Status:  Discontinued        750 mg 150 mL/hr over 60 Minutes Intravenous Every 24 hours 08/31/22 0822 09/02/22 0521   08/30/22 1800  vancomycin (VANCOREADY) IVPB 1500 mg/300 mL        1,500 mg 150 mL/hr over 120 Minutes Intravenous STAT 08/30/22 1739 08/30/22 2139       Assessment/Plan: Rectal/anal bleeding from ulcers with multiple significant co morbidities S/p EUA and over sewing of ulcers and packing anal canal with gelfoam  Doing well - no further bleeding Would recommend keeping stools soft - daily miralax We will remain available if questions/concerns arise   Albert Olp, MD [facility]

## 2022-09-28 NOTE — Progress Notes (Signed)
Pharmacy Antibiotic Note  Albert Harris is a 66 y.o. 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 Now with MRSA bacteremia. Noted patient is ESRD HD TTS PTA, with a right arm AV graft and a catheter in his right IJ. Pharmacy consulted to dose daptomycin due to enterococcus faecium and staph aureus growing from tissue cultures from 4/3.  HD TTS, per nephrology. Next HD 4/23 CK 18  Plan: - Continue Daptomycin  (8 mg/kg) IV post HD on Tues/Thurs and 750 mg (10 m/gk) IV post HD on Saturdays - Will continue to monitor weekly CK, follow HD schedule  Height: 5' 7.99" (172.7 cm) Weight: 72.5 kg (159 lb 13.3 oz) (bed scale) IBW/kg (Calculated) : 68.38  Temp (24hrs), Avg:98.4 F (36.9 C), Min:98.2 F (36.8 C), Max:98.6 F (37 C)  Recent Labs  Lab 09/22/22 2038 09/23/22 0317 09/24/22 0400 09/25/22 0509 09/26/22 0217 09/26/22 0912 09/27/22 0230 09/28/22 0340  WBC 20.7* 16.9* 21.0* 14.3*  --   --   --  12.8*  CREATININE  --  4.59* 5.69* 3.97* 5.59* 6.50* 7.14* 4.69*     Estimated Creatinine Clearance: 14.6 mL/min (A) (by C-G formula based on SCr of 4.69 mg/dL (H)).    Allergies  Allergen Reactions   Benadryl [Diphenhydramine] Rash   Lidocaine-Prilocaine Rash    Caused red marks up and down arm   Antimicrobials this admission:  Cefazolin 3/23 x 1 Vancomycin 3/24 >> 4/5 Zosyn 4/3 >>4/3 Daptomycin 4/5>>5/17  Microbiology results:  4/3 L groin wound: MSRA + VRE 4/3 MRSA PCR: neg 3/27 Bcx: neg 3/25 Bcx: MRSA 3/23 Bcx: MRSA   Wilburn Cornelia, PharmD, BCPS Clinical Pharmacist 09/28/2022 12:49 PM   Please refer to [facility] for pharmacy phone number

## 2022-09-28 NOTE — Progress Notes (Signed)
Progress Note  Primary GI: Unassigned  LOS: 31 days   Chief Complaint:Recurrent hematochezia   Subjective  Patient states he is doing better today.  Denies nausea/vomiting.  Has minimal abdominal discomfort.  No further rectal bleeding since Saturday 4/20.  Patient with family at bedside, daughter. Provided some of the history.    Objective   Vital signs in last 24 hours: Temp:  [98.2 F (36.8 C)-98.6 F (37 C)] 98.2 F (36.8 C) (04/22 0845) Pulse Rate:  [73-86] 73 (04/22 0436) Resp:  [16-19] 17 (04/22 0436) BP: (129-185)/(55-78) 136/62 (04/22 0845) SpO2:  [95 %-100 %] 98 % (04/22 0436) Last BM Date : 09/26/22 Last BM recorded by nurses in past 5 days Stool Type: Type 7 (Liquid consistency with no solid pieces) (09/25/2022  9:30 PM)  General:   male in no acute distress Heart:  Regular rate and rhythm; no murmurs Pulm: Clear anteriorly; no wheezing Abdomen: soft, nondistended, normal bowel sounds in all quadrants. Nontender without guarding. No organomegaly appreciated. Extremities:  No edema Neurologic:  Alert and  oriented x4;  No focal deficits.  Psych:  Cooperative. Normal mood and affect.  Intake/Output from previous day: 04/21 0701 - 04/22 0700 In: 543.7 [P.O.:480; IV Piggyback:63.7] Out: 2000  Intake/Output this shift: No intake/output data recorded.  Studies/Results: No results found.  Lab Results: Recent Labs    09/26/22 0912 09/26/22 1715 09/28/22 0340  WBC  --   --  12.8*  HGB 9.5* 9.1* 8.1*  HCT 28.0* 26.6* 24.9*  PLT  --   --  159   BMET Recent Labs    09/26/22 0217 09/26/22 0912 09/27/22 0230 09/28/22 0340  NA 133* 136 135 136  K 3.8 3.9 4.3 3.6  CL 95* 97* 97* 97*  CO2 25  --  24 27  GLUCOSE 109* 94 85 101*  BUN 48* 44* 61* 36*  CREATININE 5.59* 6.50* 7.14* 4.69*  CALCIUM 7.8*  --  7.7* 7.4*   LFT Recent Labs    09/28/22 0340  ALBUMIN <1.5*   PT/INR No results for input(s): "LABPROT", "INR" in the last 72  hours.   Scheduled Meds:  (feeding supplement) PROSource Plus  30 mL Oral TID WC   sodium chloride   Intravenous Once   acetaminophen  650 mg Oral Q6H   ascorbic acid  250 mg Oral BID   budesonide  9 mg Oral Daily   calcium acetate  1,334 mg Oral TID WC   Chlorhexidine Gluconate Cloth  6 each Topical Q0600   [START ON 10/01/2022] collagenase   Topical Daily   darbepoetin (ARANESP) injection - DIALYSIS  200 mcg Subcutaneous Q Wed-1800   feeding supplement  1 Container Oral TID BM   liver oil-zinc oxide   Topical TID   mesalamine  1,000 mg Rectal BID   methocarbamol  500 mg Oral TID   metoprolol succinate  25 mg Oral Daily   multivitamin  1 tablet Oral QHS   nutrition supplement (JUVEN)  1 packet Oral BID BM   pantoprazole  40 mg Oral BID   polyethylene glycol  17 g Oral Daily   rosuvastatin  10 mg Oral Daily   sodium chloride flush  10-40 mL Intracatheter Q12H   sodium hypochlorite   Irrigation BID   vitamin A  10,000 Units Oral Daily   Continuous Infusions:  sodium chloride 0 mL/hr at 09/22/22 0757   DAPTOmycin (CUBICIN) 600 mg in sodium chloride 0.9 % IVPB Stopped (09/24/22 1759)  DAPTOmycin (CUBICIN) 750 mg in sodium chloride 0.9 % IVPB Stopped (09/26/22 1731)   sodium chloride        Patient profile:    67 year old male with ESRD on HD, admitted with septic shock from MRSA bacteria from a left groin wound with bleeding/retroperitoneal hematoma s/p L common iliac to profunda femoris bypass and L common iliac artery angioplasty (09/09/2022) with recurrent lower GI bleed with rectal ulcers, left-sided diverticulosis, possible new onset Crohn's disease with right sided colonic ulceration.     INPATIENT WORKUP: Flex sig 4/7 with multiple rectal ulcers without stigmata. CT-A 4/7 negative for active bleeding.    Colonoscopy 09/15/2022: 2 large ulcers in distal rectum, clip placed. Ulcers in ascending colon, cecum, ileocecal valve, biopsied. Diverticulosis in descending and sigmoid  colon. Inflamed mucosa in sigmoid colon, biopsied. 2 mm polyp in distal transverse colon, resected. Nonbleeding internal hemorrhoids.    Flex sigmoidoscopy 4/11: 2 large distal ulcers in distal rectum, injected. treated with bipolar cautery.  Hemostatic gel applied.  Diverticulosis in sigmoid.  No specimens collected   Flex sigmoidoscopy 09/24/2022: Blood in rectum, sigmoid, and descending colon.  Ulcer in distal rectum not bleeding.  Ulcer in distal rectum that was briskly bleeding.  Hemostatic gel applied with control of bleeding.   Impression:   Recurrent Hematochezia; likely secondary rectal ulcers/Crohn's - Patient developed massived rectal bleeding 4/18.  Not a candidate for IR intervention due to severe peripheral vascular disease and anatomy. Flex sig as above. General surgery was consulted and patient was taken to the OR 4/20 and underwent EUA and over sewing of ulcers and packing anal canal with gelfoam 4/20 - s/p EUA and over sewing of ulcers and packing anal canal with gelfoam 4/20 - hgb 8.1 (gradual drift from 9.5 on 4/20)   ESRD on HD CHF MRSA bacteremia CVA   Plan:   -Continue budesonide 9 Mg daily -Continue MiraLAX -Continue supportive care  Albert Harris  09/28/2022, 11:37 AM

## 2022-09-28 NOTE — Progress Notes (Signed)
Physical Therapy Treatment Patient Details Name: Albert Harris MRN: [id number] DOB: [birth year] Today's Date: 09/28/2022   History of Present Illness 67 year old male  ER with abdominal pain, nausea, diaphoresis and unable to feel his Lt foot. Hypotensive with lactic acidosis in ER.  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 CRRT 3/22-3/26.  3/22 repair of distal Lt external iliac artery. On 3/30, Hgb drop from 9 to 7 with worsening abdominal pain, CT abd 3/30 shows slightly larger anterior pelvic extraperitoneal hematoma. 4/18 -  massive hemoptysis requiring 4 units PRBC, bedside flex sig with bleeding ulcer, hemostatic gel applied, ETT 4/18-19. PMHx: PAD, ESRD HD, recently hospitalized 3/16-18 for acute on chronic left lower extremity ischemia.    PT Comments    Pt continue with sacral wound and bleeding from rectum. Pt was able to transfer to EOB and tolerate sitting for about 6-44min with min/mod support. Pt again experienced drop in bp to 88/68 from 150/65 however continued to stay engaged. Pain in L LE continues to be the biggest barrier for mobility progression. Acute PT to continue to follow. D/C recs remain appropriate.    Recommendations for follow up therapy are one component of a multi-disciplinary discharge planning process, led by the attending physician.  Recommendations may be updated based on patient status, additional functional criteria and insurance authorization.  Follow Up Recommendations  Can patient physically be transported by private vehicle: No    Assistance Recommended at Discharge Frequent or constant Supervision/Assistance  Patient can return home with the following Assist for transportation;Assistance with cooking/housework;Help with stairs or ramp for entrance;A lot of help with walking and/or transfers;A lot of help with bathing/dressing/bathroom   Equipment Recommendations  Other (comment) (defer)    Recommendations for Other  Services       Precautions / Restrictions Precautions Precautions: Fall Precaution Comments: L groin wound vac; Per verbal order of Dr. Chestine Spore and Dr. Lenell Antu 4/19 - unrestricted L hip ROM, pt appropriate to move OOB and mobilize as appropriate. Restrictions Weight Bearing Restrictions: Yes LLE Weight Bearing: Weight bearing as tolerated     Mobility  Bed Mobility Overal bed mobility: Needs Assistance Bed Mobility: Rolling, Sit to Sidelying, Sidelying to Sit Rolling: Mod assist Sidelying to sit: Mod assist, +2 for physical assistance Supine to sit: Mod assist   Sit to sidelying: Mod assist, +2 for physical assistance General bed mobility comments: assist for log roll to and from EOB to prevent shearing on buttocks, mod +2 for trunk elevation/lowering, LE translation to/from EOB, and repositioning once returned to supine.    Transfers                   General transfer comment: NT - sizewise bed too high off the ground, limited by BP, fatigue and pain after sitting EOB    Ambulation/Gait                   Stairs             Wheelchair Mobility    Modified Rankin (Stroke Patients Only)       Balance Overall balance assessment: Needs assistance Sitting-balance support: Bilateral upper extremity supported, Single extremity supported, No upper extremity supported, Feet unsupported Sitting balance-Leahy Scale: Fair Sitting balance - Comments: fair to poor, periods of supervision transitioning to min assist given fatigue and posterior truncal bias Postural control: Posterior lean  Cognition Arousal/Alertness: Awake/alert Behavior During Therapy: WFL for tasks assessed/performed Overall Cognitive Status: Within Functional Limits for tasks assessed                                 General Comments: pt with noted STM, depressed spirits but cooperated and gave great effort t/o session         Exercises General Exercises - Lower Extremity Ankle Circles/Pumps:  (wiggling toes, no active DF on L) Long Arc Quad: AROM, Right, AAROM, Left, 5 reps, Seated    General Comments General comments (skin integrity, edema, etc.): BP dropped from 150/65 (86) to 88/68 (76) while EOB, pt with report of lightheadedness but was able to stay engaged with therapy      Pertinent Vitals/Pain Pain Assessment Pain Assessment: 0-10 Pain Score: 8  Pain Location: L leg/foot, buttocks Pain Descriptors / Indicators: Grimacing, Guarding, Sore, Constant    Home Living                          Prior Function            PT Goals (current goals can now be found in the care plan section) Acute Rehab PT Goals Patient Stated Goal: Pt wants to get better and go home. PT Goal Formulation: With patient/family Time For Goal Achievement: 09/30/22 Potential to Achieve Goals: Fair Progress towards PT goals: Progressing toward goals    Frequency    Min 2X/week      PT Plan Current plan remains appropriate    Co-evaluation PT/OT/SLP Co-Evaluation/Treatment: Yes Reason for Co-Treatment: Complexity of the patient's impairments (multi-system involvement);For patient/therapist safety;To address functional/ADL transfers PT goals addressed during session: Strengthening/ROM OT goals addressed during session: ADL's and self-care;Strengthening/ROM      AM-PAC PT "6 Clicks" Mobility   Outcome Measure  Help needed turning from your back to your side while in a flat bed without using bedrails?: A Lot Help needed moving from lying on your back to sitting on the side of a flat bed without using bedrails?: A Lot Help needed moving to and from a bed to a chair (including a wheelchair)?: Total Help needed standing up from a chair using your arms (e.g., wheelchair or bedside chair)?: Total Help needed to walk in hospital room?: Total Help needed climbing 3-5 steps with a railing? : Total 6 Click  Score: 8    End of Session   Activity Tolerance: Patient limited by fatigue;Patient limited by pain (and drop in BP) Patient left: in bed;with call bell/phone within reach;with family/visitor present;with bed alarm set Nurse Communication: Mobility status;Precautions (no hip ROM restrictions per vascular surgery team) PT Visit Diagnosis: Other abnormalities of gait and mobility (R26.89);Muscle weakness (generalized) (M62.81);Difficulty in walking, not elsewhere classified (R26.2)     Time: 2440-1027 PT Time Calculation (min) (ACUTE ONLY): 42 min  Charges:  $Therapeutic Exercise: 8-22 mins $Therapeutic Activity: 8-22 mins                     Lewis Shock, PT, DPT Acute Rehabilitation Services Secure chat preferred Office #: [phone]    Iona Hansen 09/28/2022, 12:48 PM

## 2022-09-28 NOTE — Progress Notes (Addendum)
  Progress Note    09/28/2022 8:19 AM 2 Days Post-Op  Subjective:  left thigh soreness and aching   Vitals:   09/28/22 0312 09/28/22 0436  BP: (!) 176/75   Pulse: 74 73  Resp: 19 17  Temp: 98.3 F (36.8 C)   SpO2: 98% 98%   Physical Exam: Cardiac:  regular Lungs:  non labored Incisions:  left groin VAC in place. Seal is not great. Multiple Tegaderms/ tape in place to keep seal Extremities:  monophasic left pero and PT signals, foot unchanged. Right with brisk PT/DP Abdomen:  soft, non distended Neurologic: alert and oriented  CBC    Component Value Date/Time   WBC 12.8 (H) 09/28/2022 0340   RBC 2.64 (L) 09/28/2022 0340   HGB 8.1 (L) 09/28/2022 0340   HGB 10.4 (L) 06/18/2021 1431   HCT 24.9 (L) 09/28/2022 0340   HCT 31.6 (L) 06/18/2021 1431   PLT 159 09/28/2022 0340   PLT 142 (L) 06/18/2021 1431   MCV 94.3 09/28/2022 0340   MCV 96 06/18/2021 1431   MCH 30.7 09/28/2022 0340   MCHC 32.5 09/28/2022 0340   RDW 18.6 (H) 09/28/2022 0340   RDW 14.3 06/18/2021 1431   LYMPHSABS 1.0 09/28/2022 0340   LYMPHSABS 1.5 06/18/2021 1431   MONOABS 0.9 09/28/2022 0340   EOSABS 0.2 09/28/2022 0340   EOSABS 1.0 (H) 06/18/2021 1431   BASOSABS 0.0 09/28/2022 0340   BASOSABS 0.1 06/18/2021 1431    BMET    Component Value Date/Time   NA 136 09/28/2022 0340   NA 141 06/18/2021 1431   K 3.6 09/28/2022 0340   CL 97 (L) 09/28/2022 0340   CO2 27 09/28/2022 0340   GLUCOSE 101 (H) 09/28/2022 0340   BUN 36 (H) 09/28/2022 0340   BUN 20 06/18/2021 1431   CREATININE 4.69 (H) 09/28/2022 0340   CREATININE 1.15 02/19/2017 1049   CALCIUM 7.4 (L) 09/28/2022 0340   CALCIUM 8.5 (L) 06/10/2020 0900   GFRNONAA 13 (L) 09/28/2022 0340   GFRAA 6 (L) 03/04/2020 1045    INR    Component Value Date/Time   INR 1.2 08/12/2022 1501     Intake/Output Summary (Last 24 hours) at 09/28/2022 0819 Last data filed at 09/27/2022 1952 Gross per 24 hour  Intake 543.65 ml  Output 2000 ml  Net  -1456.35 ml     Assessment/Plan:  67 y.o. male is s/p removal of infected left femoral patch, iliofemoral bypass, with sartorius muscle flap 09/09/22  Weekend events noted following general surgery procedure for continued rectal bleeding from anal ulceration. Management per General surgery LLE remains stable from perfusion standpoint Left groin VAC change later today H&H stable Remains on Cubicin Appreciate TRH, General Surgery, ID, Nephrology assistance   Graceann Congress, [place] [facility] [phone] 09/28/2022 8:19 AM  VASCULAR STAFF ADDENDUM: I have independently interviewed and examined the patient. I agree with the above.  VAC changed. Healing well    Fara Olden, MD [facility] Phone Number: [phone] 09/28/2022 11:20 AM

## 2022-09-28 NOTE — Progress Notes (Addendum)
Occupational Therapy Treatment Patient Details Name: Albert Harris MRN: [id number] DOB: Oct 04, 1955 Today's Date: 09/28/2022   History of present illness 67 year old male  ER with abdominal pain, nausea, diaphoresis and unable to feel his Lt foot. Hypotensive with lactic acidosis in ER.  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 CRRT 3/22-3/26.  3/22 repair of distal Lt external iliac artery. On 3/30, Hgb drop from 9 to 7 with worsening abdominal pain, CT abd 3/30 shows slightly larger anterior pelvic extraperitoneal hematoma. 4/18 -  massive hemoptysis requiring 4 units PRBC, bedside flex sig with bleeding ulcer, hemostatic gel applied, ETT 4/18-19. PMHx: PAD, ESRD HD, recently hospitalized 3/16-18 for acute on chronic left lower extremity ischemia.   OT comments  Kathlene November is progressing towards newly established OT goals. He has been cleared for ROM at the hip allowing him to focus on seated balance EOB for participation in ADL in unsupported position. He was min to mod A for seated balance, min A to set up for grooming tasks. Remains mod A for UB dressing and max A for LB ADL at this time. Did not attempt transfer due to orthostatics, and height of sizewize bed from floor. Suggest lift to chair, and practice standing from there next session. Also Pt's red theraband has gone missing - Pt will benefit from replacement next session. OT will continue to follow acutely. POC remains appropriate at this time.   Orthostatics During Session: Supine initial: 150/65 (86) HR 84  HOB 45 degrees: 131/66 (79) HR 78  EOB 115/63 (71) HR 84  EOB 3 MIN: 88/68 (76) HR 84  Supine/sidelying: 106/49 (63) HR 78    Recommendations for follow up therapy are one component of a multi-disciplinary discharge planning process, led by the attending physician.  Recommendations may be updated based on patient status, additional functional criteria and insurance authorization.    Assistance Recommended at  Discharge Intermittent Supervision/Assistance  Patient can return home with the following  A lot of help with walking and/or transfers;A lot of help with bathing/dressing/bathroom;Assistance with cooking/housework;Assist for transportation;Help with stairs or ramp for entrance   Equipment Recommendations  Other (comment) (defer to next venue of care)    Recommendations for Other Services      Precautions / Restrictions Precautions Precautions: Fall Precaution Comments: L groin wound vac; Per verbal order of Dr. Chestine Spore and Dr. Lenell Antu 4/19 - unrestricted L hip ROM, pt appropriate to move OOB and mobilize as appropriate. Restrictions Weight Bearing Restrictions: Yes LLE Weight Bearing: Weight bearing as tolerated       Mobility Bed Mobility Overal bed mobility: Needs Assistance Bed Mobility: Rolling, Sit to Sidelying, Sidelying to Sit Rolling: Mod assist Sidelying to sit: Mod assist, +2 for physical assistance     Sit to sidelying: Mod assist, +2 for physical assistance General bed mobility comments: assist for log roll to and from EOB to prevent shearing on buttocks, mod +2 for trunk elevation/lowering, LE translation to/from EOB, and repositioning once returned to supine.    Transfers                   General transfer comment: NT - sizewise bed too high off the ground, limited by BP, fatigue and pain after sitting EOB     Balance Overall balance assessment: Needs assistance Sitting-balance support: Bilateral upper extremity supported, Single extremity supported, No upper extremity supported, Feet unsupported Sitting balance-Leahy Scale: Fair Sitting balance - Comments: fair to poor, periods of supervision transitioning to  min assist given fatigue and posterior truncal bias Postural control: Posterior lean                                 ADL either performed or assessed with clinical judgement   ADL Overall ADL's : Needs assistance/impaired      Grooming: Wash/dry face;Min guard;Sitting Grooming Details (indicate cue type and reason): EOB,             Lower Body Dressing: Maximal assistance;Bed level Lower Body Dressing Details (indicate cue type and reason): to don socks                    Extremity/Trunk Assessment Upper Extremity Assessment Upper Extremity Assessment: Generalized weakness            Vision   Vision Assessment?: No apparent visual deficits   Perception     Praxis      Cognition Arousal/Alertness: Awake/alert Behavior During Therapy: WFL for tasks assessed/performed Overall Cognitive Status: Within Functional Limits for tasks assessed                                 General Comments: pleasant and motivated        Exercises      Shoulder Instructions       General Comments Therband gone - will need replacement    Pertinent Vitals/ Pain       Pain Assessment Pain Assessment: Faces Faces Pain Scale: Hurts even more Pain Location: L leg/foot, buttocks Pain Descriptors / Indicators: Grimacing, Guarding, Sore, Constant Pain Intervention(s): Limited activity within patient's tolerance, Monitored during session, Premedicated before session, Repositioned  Home Living                                          Prior Functioning/Environment              Frequency  Min 2X/week        Progress Toward Goals  OT Goals(current goals can now be found in the care plan section)  Progress towards OT goals: Progressing toward goals  Acute Rehab OT Goals Patient Stated Goal: increase strength, get home OT Goal Formulation: With patient/family Time For Goal Achievement: 10/12/22 Potential to Achieve Goals: Good ADL Goals Pt Will Perform Grooming: with set-up;sitting Pt Will Perform Upper Body Dressing: with min assist;sitting Pt Will Perform Lower Body Dressing: with max assist;sit to/from stand Pt Will Transfer to Toilet: squat pivot  transfer;bedside commode;with max assist Additional ADL Goal #1: Pt will identify 3+ fall prevention strategies for use in the home setting.  Plan Discharge plan remains appropriate    Co-evaluation    PT/OT/SLP Co-Evaluation/Treatment: Yes Reason for Co-Treatment: Complexity of the patient's impairments (multi-system involvement);For patient/therapist safety;To address functional/ADL transfers PT goals addressed during session: Strengthening/ROM OT goals addressed during session: ADL's and self-care;Strengthening/ROM      AM-PAC OT "6 Clicks" Daily Activity     Outcome Measure   Help from another person eating meals?: A Little Help from another person taking care of personal grooming?: A Little Help from another person toileting, which includes using toliet, bedpan, or urinal?: Total Help from another person bathing (including washing, rinsing, drying)?: A Lot Help from another person to put on and taking off regular  upper body clothing?: A Lot Help from another person to put on and taking off regular lower body clothing?: Total 6 Click Score: 12    End of Session    OT Visit Diagnosis: Unsteadiness on feet (R26.81);Muscle weakness (generalized) (M62.81);Dizziness and giddiness (R42)   Activity Tolerance Patient tolerated treatment well (impacted by orthostatics)   Patient Left in bed;with call bell/phone within reach;with family/visitor present   Nurse Communication Mobility status;Need for lift equipment        Time: 1610-9604 OT Time Calculation (min): 43 min  Charges: OT General Charges $OT Visit: 1 Visit OT Treatments $Therapeutic Activity: 8-22 mins  Nyoka Cowden OTR/L Acute Rehabilitation Services Office: [phone]  Evern [facility] 09/28/2022, 11:24 AM  Addendum: added orthostatic values

## 2022-09-28 NOTE — Progress Notes (Signed)
BP 171/72, other VS stable, pt asymptomatic. Margo Aye, MD notified.  No new orders at this time.

## 2022-09-28 NOTE — Progress Notes (Signed)
[facility]                                                                               [place], is a 67 y.o. male, DOB - August 18, 1955, YQM:[id number] Admit date - 08/28/2022    Outpatient Primary MD for the patient is [name], NP  LOS - 31  days    Brief summary   67 year old male with history of ESRD on HD TTS, PVD,COPD, essential hypertension, chronic systolic congestive heart failure with ejection fraction 35 to 40%, presented with abdominal pain nausea and diaphoresis.Patient was also hypotensive with a lactic acidosis and was admitted to ICU,with a pulseless left lower extremity was found to have femoral pseudoaneurysm. Vascular surgery consulted and underwent  iliofemoral bypass with sartorius muscle flap and removal of infected left femoral patch. Culture came back with MRSA bacteremia-TEE neg .  Significant Hospital Events; 3/22 Admit, emergent CRRT, to OR for repair of distal Lt external iliac artery 3/24 Extubated. CT head obtained for altered mental status did not show any acute abnormalities 3/26 Off CRRT and pressors, Vascular surgery consulted for clotted AVG 3/27 AV graft thrombectomy attempted in OR but patient became hemodynamically unstable and procedure was aborted.  Was on pressors briefly 3/28 dialysis followed by CVC and aline removal for line holiday 3/29 MRI brain ordered for LLE weakness and found with right subinsular 4mm infarct. Neuro consulted 3/30 CT A/P increased hematoma in left pelvic region associated with Hg drop 9>7 4/1 hgb stable, TEE, new tunneled line planned 4/3 L groin bleeding. Taken to OR urgently by VVS for iliofemoral bypass, removal of infected patch, sartorius flap.  4/6, 4/7 ongoing rectal bleeding. Transfused both days.  4/6 flex sig with multiple non-bleeding ulcers.  4/8 ongoing rectal bleeding. Transfusing again. Did not drink bowel prep for colonoscopy. Aborted.  PCCM called back. ID recommended 6 weeks  of IV antibiotics followed by po suppression indefinitely.  4/11 two large bloody BM's overnight with Hgb drop from 7.8 to 6.5 with hypotension, moved back to ICU and transferred  4/18 called to the floor for massive hematochezia, intubated, bedside flex sig with bleeding ulcer, hemostatic gel applied 4/19 extubated, bleeding anal ulcers with hypotension. 4/20 S/p EUA and over sewing of ulcers and packing anal canal with gelfoam by Dr Magnus Ivan.  No new complaints overnight.    Assessment & Plan    Assessment and Plan:  Acute blood loss anemia from bleeding anal/rectal ulcers possibly from his Crohn's disease Patient underwent flex sigmoidoscopy on 4/6 was found to have 5 nonbleeding ulcers.  He also underwent colonoscopy which showed multiple rectal ulcers with visible bleeding underwent hemostasis. Patient developed massive rectal bleeding again on 4/18. Patient is not a candidate for IR intervention due to severe peripheral vascular disease and anatomy General surgery on board, was taken to the OR  on 4/20 and underwent S/p EUA and over sewing of ulcers and packing anal canal with gelfoam  by Dr Magnus Ivan.  Hemoglobin this morning around  9.5. continue to monitor and transfuse to keep it greater than 8.  Restarted him on Budesonide on 4/19 and continue with mesalamine bid. No more rectal bleeding  since 4/20.     PVD / left common femoral artery pseudoaneurysm S/p left groin hematoma evacuation, primary repair of the distal external iliac artery. Further complicated on 4/3 by left groin bleeding with new pseudoaneurysm and arterial thrombus. S/p iliofemoral bypass and removal of infected patch with sartorius muscle flap. Further management as per vascular surgery Continue with wound VAC    MRSA bacteremia secondary to postop wound infection with MRSA and Enterococcus faecium. Infectious disease note from 4/8 recommending daptomycin for 6 weeks followed by indefinite oral suppression  of antibiotics. Monitor CPK weekly TEE is negative for vegetations. Patient will need outpatient follow-up with infectious disease to determine oral antibiotics. Last dose of IV antibiotics on Oct 23, 2022 followed by oral antibiotics indefinitely.    End-stage renal disease Nephrology on board and dialysis via right IJ tunneled catheter. Further HD sessions as per nephrology.   Chronic systolic heart failure Last echocardiogram from January 2024 showed left ventricular ejection fraction of 35 to 40% Pulmonary hypertension Restart plavix in the next 24 hours if no bleeding.  Fluid management as per dialysis.    Acute metabolic encephalopathy from uremia, acidosis and acute 4 mm right subinsular stroke Resolved. Pt is alert , following commands.  Once bleeding is controlled, will need antiplatelet therapy for acute infarct.     Retroperitoneal hemorrhage Appears to be Stable   Moderate malnutrition Nutritionist on board, supplementations to be ordered   RN Pressure Injury Documentation: Pressure Injury 08/28/22 Sacrum Medial Unstageable - Full thickness tissue loss in which the base of the injury is covered by slough (yellow, tan, gray, green or brown) and/or eschar (tan, brown or black) in the wound bed. Sacral wound with eschar coveri (Active)  08/28/22 2228  Location: Sacrum  Location Orientation: Medial  Staging: Unstageable - Full thickness tissue loss in which the base of the injury is covered by slough (yellow, tan, gray, green or brown) and/or eschar (tan, brown or black) in the wound bed.  Wound Description (Comments): Sacral wound with eschar covering wound bed  Present on Admission: Yes  Dressing Type Dakin's-soaked gauze;Foam - Lift dressing to assess site every shift 09/28/22 0630     Pressure Injury 09/04/22 Heel Left Deep Tissue Pressure Injury - Purple or maroon localized area of discolored intact skin or blood-filled blister due to damage of underlying  soft tissue from pressure and/or shear. (Active)  09/04/22 1200  Location: Heel  Location Orientation: Left  Staging: Deep Tissue Pressure Injury - Purple or maroon localized area of discolored intact skin or blood-filled blister due to damage of underlying soft tissue from pressure and/or shear.  Wound Description (Comments):   Present on Admission:   Dressing Type Foam - Lift dressing to assess site every shift 09/28/22 0630  Wound care on board.    In view of his debility , multiple medical issues, palliative care consulted for goals of care.  Malnutrition Type:  Nutrition Problem: Moderate Malnutrition (in the context of chronic illness (ESRD)) Etiology: poor appetite   Malnutrition Characteristics:  Signs/Symptoms: moderate fat depletion, severe muscle depletion   Nutrition Interventions:  Interventions: Refer to RD note for recommendations, Liberalize Diet, Nepro shake, MVI  Estimated body mass index is 24.31 kg/m as calculated from the following:   Height as of this encounter: 5' 7.99" (1.727 m).   Weight as of this encounter: 72.5 kg.  Code Status: full code.  DVT Prophylaxis:  SCD's Start: 09/09/22 1841   Level of Care: Level of care: Progressive  Family Communication: none at bedside.   Disposition Plan:     Remains inpatient appropriate:  pending PT evaluation.   Procedures:  Bleeding anal ulcer ligation  Consultants:   PCCM,  General surgery.  Vascular surgery.  Gastroenterology.  Neurology. ID  IR  Antimicrobials:   Anti-infectives (From admission, onward)    Start     Dose/Rate Route Frequency Ordered Stop   09/27/22 1200  DAPTOmycin (CUBICIN) 750 mg in sodium chloride 0.9 % IVPB  Status:  Discontinued        750 mg 130 mL/hr over 30 Minutes Intravenous Daily-1800 09/26/22 1733 09/27/22 0924   09/27/22 1200  DAPTOmycin (CUBICIN) 750 mg in sodium chloride 0.9 % IVPB        750 mg 130 mL/hr over 30 Minutes Intravenous  Once 09/27/22 0923  09/27/22 1500   09/26/22 1800  DAPTOmycin (CUBICIN) 750 mg in sodium chloride 0.9 % IVPB        10 mg/kg  75.2 kg 130 mL/hr over 30 Minutes Intravenous Every Sat (1800) 09/24/22 1342 10/23/22 2359   09/24/22 1800  DAPTOmycin (CUBICIN) 600 mg in sodium chloride 0.9 % IVPB        8 mg/kg  75.2 kg 124 mL/hr over 30 Minutes Intravenous Once per day on Tue Thu 09/24/22 1342 10/23/22 2359   09/19/22 1800  DAPTOmycin (CUBICIN) 750 mg in sodium chloride 0.9 % IVPB  Status:  Discontinued        10 mg/kg  75.2 kg 130 mL/hr over 30 Minutes Intravenous Every Sat (1800) 09/14/22 1622 09/24/22 1342   09/16/22 0830  DAPTOmycin (CUBICIN) 600 mg in sodium chloride 0.9 % IVPB  Status:  Discontinued        8 mg/kg  74.8 kg 124 mL/hr over 30 Minutes Intravenous  Once 09/16/22 0738 09/16/22 1555   09/15/22 1800  DAPTOmycin (CUBICIN) 600 mg in sodium chloride 0.9 % IVPB  Status:  Discontinued        8 mg/kg  75.2 kg 124 mL/hr over 30 Minutes Intravenous Once per day on Tue Thu 09/14/22 1622 09/24/22 1342   09/12/22 1800  DAPTOmycin (CUBICIN) 600 mg in sodium chloride 0.9 % IVPB        600 mg 124 mL/hr over 30 Minutes Intravenous  Once 09/12/22 1049 09/12/22 2033   09/11/22 1615  DAPTOmycin (CUBICIN) 600 mg in sodium chloride 0.9 % IVPB        600 mg 124 mL/hr over 30 Minutes Intravenous  Once 09/11/22 1522 09/11/22 2042   09/10/22 1200  vancomycin (VANCOREADY) IVPB 500 mg/100 mL  Status:  Discontinued        500 mg 100 mL/hr over 60 Minutes Intravenous Every T-Th-Sa (Hemodialysis) 09/09/22 0719 09/10/22 0719   09/10/22 0815  vancomycin (VANCOREADY) IVPB 750 mg/150 mL        750 mg 150 mL/hr over 60 Minutes Intravenous  Once 09/10/22 0719 09/10/22 0939   09/10/22 0718  vancomycin variable dose per unstable renal function (pharmacist dosing)  Status:  Discontinued         Does not apply See admin instructions 09/10/22 0719 09/11/22 1522   09/09/22 2000  piperacillin-tazobactam (ZOSYN) IVPB 2.25 g  Status:   Discontinued        2.25 g 100 mL/hr over 30 Minutes Intravenous Every 8 hours 09/09/22 1849 09/10/22 1003   09/09/22 1145  ceFAZolin (ANCEF) 2-4 GM/100ML-% IVPB       Note to Pharmacy: Payton Emerald A: cabinet override  09/09/22 1145 09/09/22 2359   09/08/22 0830  vancomycin (VANCOREADY) IVPB 750 mg/150 mL        750 mg 150 mL/hr over 60 Minutes Intravenous  Once 09/08/22 0743 09/08/22 1004   09/07/22 1200  ceFAZolin (ANCEF) IVPB 2g/100 mL premix       Note to Pharmacy: Give in IR for surgical prophylaxis   2 g 200 mL/hr over 30 Minutes Intravenous  Once 09/07/22 1100 09/07/22 1327   09/07/22 1101  ceFAZolin (ANCEF) 2-4 GM/100ML-% IVPB       Note to Pharmacy: Phebe Colla N: cabinet override      09/07/22 1101 09/07/22 1335   09/07/22 0734  vancomycin variable dose per unstable renal function (pharmacist dosing)  Status:  Discontinued         Does not apply See admin instructions 09/07/22 0734 09/09/22 0718   09/05/22 1600  vancomycin (VANCOREADY) IVPB 750 mg/150 mL        750 mg 150 mL/hr over 60 Minutes Intravenous  Once 09/05/22 1505 09/05/22 1717   09/02/22 1227  ceFAZolin (ANCEF) 2-4 GM/100ML-% IVPB       Note to Pharmacy: Isabel Caprice, Destiny: cabinet override      09/02/22 1227 09/03/22 0044   09/02/22 0520  vancomycin variable dose per unstable renal function (pharmacist dosing)  Status:  Discontinued         Does not apply See admin instructions 09/02/22 0521 09/05/22 0940   08/31/22 1800  vancomycin (VANCOREADY) IVPB 750 mg/150 mL  Status:  Discontinued        750 mg 150 mL/hr over 60 Minutes Intravenous Every 24 hours 08/31/22 0822 09/02/22 0521   08/30/22 1800  vancomycin (VANCOREADY) IVPB 1500 mg/300 mL        1,500 mg 150 mL/hr over 120 Minutes Intravenous STAT 08/30/22 1739 08/30/22 2139        Medications  Scheduled Meds:  (feeding supplement) PROSource Plus  30 mL Oral TID WC   sodium chloride   Intravenous Once   acetaminophen  650 mg Oral Q6H    ascorbic acid  250 mg Oral BID   budesonide  9 mg Oral Daily   calcium acetate  1,334 mg Oral TID WC   Chlorhexidine Gluconate Cloth  6 each Topical Q0600   [START ON 10/01/2022] collagenase   Topical Daily   darbepoetin (ARANESP) injection - DIALYSIS  200 mcg Subcutaneous Q Wed-1800   feeding supplement  1 Container Oral TID BM   liver oil-zinc oxide   Topical TID   mesalamine  1,000 mg Rectal BID   methocarbamol  500 mg Oral TID   metoprolol succinate  25 mg Oral Daily   multivitamin  1 tablet Oral QHS   nutrition supplement (JUVEN)  1 packet Oral BID BM   pantoprazole  40 mg Oral BID   rosuvastatin  10 mg Oral Daily   sodium chloride flush  10-40 mL Intracatheter Q12H   sodium hypochlorite   Irrigation BID   vitamin A  10,000 Units Oral Daily   Continuous Infusions:  sodium chloride 0 mL/hr at 09/22/22 0757   DAPTOmycin (CUBICIN) 600 mg in sodium chloride 0.9 % IVPB Stopped (09/24/22 1759)   DAPTOmycin (CUBICIN) 750 mg in sodium chloride 0.9 % IVPB Stopped (09/26/22 1731)   sodium chloride     PRN Meds:.alteplase, dicyclomine, heparin, HYDROmorphone (DILAUDID) injection, oxyCODONE, pentafluoroprop-tetrafluoroeth, sodium chloride, traMADol    Subjective:   Albert Harris was seen and examined today. No new complaints this morning.  Objective:   Vitals:   09/28/22 0234 09/28/22 0312 09/28/22 0436 09/28/22 0845  BP: (!) 184/73 (!) 176/75  136/62  Pulse: 73 74 73   Resp: 18 19 17    Temp:  98.3 F (36.8 C)  98.2 F (36.8 C)  TempSrc:  Oral  Oral  SpO2: 95% 98% 98%   Weight:      Height:        Intake/Output Summary (Last 24 hours) at 09/28/2022 0956 Last data filed at 09/27/2022 1952 Gross per 24 hour  Intake 543.65 ml  Output 0 ml  Net 543.65 ml    Filed Weights   09/26/22 0836 09/27/22 0500 09/27/22 0847  Weight: 68.5 kg 74 kg 72.5 kg     Exam General exam: Appears calm and comfortable  Respiratory system: Clear to auscultation. Respiratory effort  normal. Cardiovascular system: S1 & S2 heard, RRR. No JVD, Gastrointestinal system: Abdomen is nondistended, soft and nontender.  Central nervous system: Alert and oriented. Extremities: left groin wound connected to left wound vac.  Skin: No rashes,  Psychiatry: Mood & affect appropriate.     Data Reviewed:  I have personally reviewed following labs and imaging studies   CBC Lab Results  Component Value Date   WBC 12.8 (H) 09/28/2022   RBC 2.64 (L) 09/28/2022   HGB 8.1 (L) 09/28/2022   HCT 24.9 (L) 09/28/2022   MCV 94.3 09/28/2022   MCH 30.7 09/28/2022   PLT 159 09/28/2022   MCHC 32.5 09/28/2022   RDW 18.6 (H) 09/28/2022   LYMPHSABS 1.0 09/28/2022   MONOABS 0.9 09/28/2022   EOSABS 0.2 09/28/2022   BASOSABS 0.0 09/28/2022     Last metabolic panel Lab Results  Component Value Date   NA 136 09/28/2022   K 3.6 09/28/2022   CL 97 (L) 09/28/2022   CO2 27 09/28/2022   BUN 36 (H) 09/28/2022   CREATININE 4.69 (H) 09/28/2022   GLUCOSE 101 (H) 09/28/2022   GFRNONAA 13 (L) 09/28/2022   GFRAA 6 (L) 03/04/2020   CALCIUM 7.4 (L) 09/28/2022   PHOS 3.7 09/28/2022   PROT 4.5 (L) 09/24/2022   ALBUMIN <1.5 (L) 09/28/2022   LABGLOB 2.8 06/18/2021   AGRATIO 1.3 06/18/2021   BILITOT 1.6 (H) 09/24/2022   ALKPHOS 92 09/24/2022   AST 19 09/24/2022   ALT 11 09/24/2022   ANIONGAP 12 09/28/2022    CBG (last 3)  Recent Labs    09/25/22 1109 09/25/22 1546  GLUCAP 92 112*       Coagulation Profile: No results for input(s): "INR", "PROTIME" in the last 168 hours.   Radiology Studies: No results found.     Kathlen Mody M.D. [facility] 09/28/2022, 9:56 AM  Available via Epic secure chat 7am-7pm After 7 pm, please refer to night coverage provider listed on amion.

## 2022-09-28 NOTE — Progress Notes (Signed)
Sacral dressing changed. Small amount of blood tinged stool noted on bed pad.

## 2022-09-29 ENCOUNTER — Encounter (HOSPITAL_COMMUNITY): Payer: Self-pay | Admitting: Gastroenterology

## 2022-09-29 DIAGNOSIS — D62 Acute posthemorrhagic anemia: Secondary | ICD-10-CM | POA: Diagnosis not present

## 2022-09-29 DIAGNOSIS — N186 End stage renal disease: Secondary | ICD-10-CM | POA: Diagnosis not present

## 2022-09-29 DIAGNOSIS — K626 Ulcer of anus and rectum: Secondary | ICD-10-CM | POA: Diagnosis not present

## 2022-09-29 DIAGNOSIS — A4102 Sepsis due to Methicillin resistant Staphylococcus aureus: Secondary | ICD-10-CM | POA: Diagnosis not present

## 2022-09-29 LAB — CBC WITH DIFFERENTIAL/PLATELET
Abs Immature Granulocytes: 0.14 10*3/uL — ABNORMAL HIGH (ref 0.00–0.07)
Basophils Absolute: 0 10*3/uL (ref 0.0–0.1)
Basophils Relative: 0 %
Eosinophils Absolute: 0.1 10*3/uL (ref 0.0–0.5)
Eosinophils Relative: 1 %
HCT: 26.4 % — ABNORMAL LOW (ref 39.0–52.0)
Hemoglobin: 8.8 g/dL — ABNORMAL LOW (ref 13.0–17.0)
Immature Granulocytes: 1 %
Lymphocytes Relative: 8 %
Lymphs Abs: 1 10*3/uL (ref 0.7–4.0)
MCH: 31.5 pg (ref 26.0–34.0)
MCHC: 33.3 g/dL (ref 30.0–36.0)
MCV: 94.6 fL (ref 80.0–100.0)
Monocytes Absolute: 0.7 10*3/uL (ref 0.1–1.0)
Monocytes Relative: 6 %
Neutro Abs: 10.3 10*3/uL — ABNORMAL HIGH (ref 1.7–7.7)
Neutrophils Relative %: 84 %
Platelets: 198 10*3/uL (ref 150–400)
RBC: 2.79 MIL/uL — ABNORMAL LOW (ref 4.22–5.81)
RDW: 19 % — ABNORMAL HIGH (ref 11.5–15.5)
WBC: 12.3 10*3/uL — ABNORMAL HIGH (ref 4.0–10.5)
nRBC: 0 % (ref 0.0–0.2)

## 2022-09-29 LAB — BASIC METABOLIC PANEL
Anion gap: 15 (ref 5–15)
BUN: 62 mg/dL — ABNORMAL HIGH (ref 8–23)
CO2: 24 mmol/L (ref 22–32)
Calcium: 8.4 mg/dL — ABNORMAL LOW (ref 8.9–10.3)
Chloride: 99 mmol/L (ref 98–111)
Creatinine, Ser: 6.41 mg/dL — ABNORMAL HIGH (ref 0.61–1.24)
GFR, Estimated: 9 mL/min — ABNORMAL LOW (ref >60)
Glucose, Bld: 93 mg/dL (ref 70–99)
Potassium: 4.6 mmol/L (ref 3.5–5.1)
Sodium: 138 mmol/L (ref 135–145)

## 2022-09-29 LAB — RENAL FUNCTION PANEL
Albumin: 1.5 g/dL — ABNORMAL LOW (ref 3.5–5.0)
Anion gap: 11 (ref 5–15)
BUN: 57 mg/dL — ABNORMAL HIGH (ref 8–23)
CO2: 26 mmol/L (ref 22–32)
Calcium: 8.3 mg/dL — ABNORMAL LOW (ref 8.9–10.3)
Chloride: 101 mmol/L (ref 98–111)
Creatinine, Ser: 5.99 mg/dL — ABNORMAL HIGH (ref 0.61–1.24)
GFR, Estimated: 10 mL/min — ABNORMAL LOW (ref >60)
Glucose, Bld: 75 mg/dL (ref 70–99)
Phosphorus: 5.3 mg/dL — ABNORMAL HIGH (ref 2.5–4.6)
Potassium: 4.6 mmol/L (ref 3.5–5.1)
Sodium: 138 mmol/L (ref 135–145)

## 2022-09-29 LAB — HEPATITIS B SURFACE ANTIGEN: Hepatitis B Surface Ag: NONREACTIVE

## 2022-09-29 MED ORDER — HEPARIN SODIUM (PORCINE) 1000 UNIT/ML IJ SOLN
INTRAMUSCULAR | Status: AC
  Administered 2022-09-29: 3200 [IU] via INTRAVENOUS_CENTRAL
  Filled 2022-09-29: qty 4

## 2022-09-29 MED ORDER — HYDRALAZINE HCL 25 MG PO TABS
25.0000 mg | ORAL_TABLET | Freq: Three times a day (TID) | ORAL | Status: DC | PRN
  Administered 2022-10-03: 25 mg via ORAL
  Filled 2022-09-29: qty 1

## 2022-09-29 MED ORDER — OXYCODONE HCL 5 MG PO TABS
5.0000 mg | ORAL_TABLET | ORAL | Status: DC | PRN
  Administered 2022-09-29 – 2022-10-03 (×12): 10 mg via ORAL
  Administered 2022-10-03: 5 mg via ORAL
  Administered 2022-10-03 – 2022-10-16 (×37): 10 mg via ORAL
  Filled 2022-09-29 (×16): qty 2
  Filled 2022-09-29 (×2): qty 1
  Filled 2022-09-29 (×13): qty 2
  Filled 2022-09-29: qty 1
  Filled 2022-09-29 (×19): qty 2
  Filled 2022-09-29: qty 1
  Filled 2022-09-29 (×3): qty 2

## 2022-09-29 NOTE — Procedures (Signed)
HD Note:  Some information was entered later than the data was gathered due to patient care needs. The stated time with the data is accurate.  Received report from off going dialysis nurse.  See flowsheet. Patient is resting with eyes closed.  TX duration:  Patient tolerated well.  Transported back to the room  Alert, without acute distress.  Hand-off given to patient's nurse.   Access used: Right upper chest HD catheter Access issues: Had to have lines reversed, but then no issues  Total UF removed: 3000 ml   [facility]

## 2022-09-29 NOTE — Progress Notes (Signed)
[facility] Progress Note   Subjective:    BP still occasionally high -  - he is alert, c/o pain in his backside-  planning for HD later today   Objective Vitals:   09/28/22 2226 09/28/22 2357 09/29/22 0203 09/29/22 0403  BP: (!) 185/87 (!) 190/83 (!) 149/68 (!) 144/74  Pulse: 94 93 82 87  Resp: 20 20 15 16   Temp: 97.8 F (36.6 C) 97.9 F (36.6 C)  98 F (36.7 C)  TempSrc: Oral Oral  Oral  SpO2: 93% 97% 95% 93%  Weight:      Height:       Physical Exam General: Awake, alert,  On RA, NAD Heart: RRR; no murmur Lungs: CTA anteriorly Abdomen: soft Extremities: 1+ bilateral LE edema; L foot with ischemic distal 3rd-5th toes Dialysis Access: [facility]  Filed Weights   09/26/22 0836 09/27/22 0500 09/27/22 0847  Weight: 68.5 kg 74 kg 72.5 kg    Intake/Output Summary (Last 24 hours) at 09/29/2022 0836 Last data filed at 09/29/2022 0700 Gross per 24 hour  Intake --  Output 50 ml  Net -50 ml    Additional Objective Labs: Basic Metabolic Panel: Recent Labs  Lab 09/27/22 0230 09/28/22 0340 09/29/22 0630  NA 135 136 138  K 4.3 3.6 4.6  CL 97* 97* 101  CO2 24 27 26   GLUCOSE 85 101* 75  BUN 61* 36* 57*  CREATININE 7.14* 4.69* 5.99*  CALCIUM 7.7* 7.4* 8.3*  PHOS 6.1* 3.7 5.3*   Liver Function Tests: Recent Labs  Lab 09/24/22 0400 09/25/22 0509 09/27/22 0230 09/28/22 0340 09/29/22 0630  AST 19  --   --   --   --   ALT 11  --   --   --   --   ALKPHOS 92  --   --   --   --   BILITOT 1.6*  --   --   --   --   PROT 4.5*  --   --   --   --   ALBUMIN 1.7*   < > <1.5* <1.5* 1.5*   < > = values in this interval not displayed.   No results for input(s): "LIPASE", "AMYLASE" in the last 168 hours. CBC: Recent Labs  Lab 09/22/22 2038 09/23/22 0317 09/24/22 0318 09/24/22 0400 09/25/22 0509 09/25/22 2240 09/26/22 0912 09/26/22 1715 09/28/22 0340  WBC 20.7* 16.9*  --  21.0* 14.3*  --   --   --  12.8*  NEUTROABS  --   --   --   --   --   --   --   --   10.7*  HGB 11.2* 10.6*   < > 11.6* 10.1*   < > 9.5* 9.1* 8.1*  HCT 34.9* 31.9*   < > 34.2* 29.9*   < > 28.0* 26.6* 24.9*  MCV 92.6 90.6  --  90.0 89.5  --   --   --  94.3  PLT 186 188  --  179 119*  --   --   --  159   < > = values in this interval not displayed.   Blood Culture    Component Value Date/Time   SDES TISSUE 09/09/2022 1453   SPECREQUEST VESSEL PT ON VANC ANCEF 09/09/2022 1453   CULT  09/09/2022 1453    RARE STAPHYLOCOCCUS AUREUS SUSCEPTIBILITIES PERFORMED ON PREVIOUS CULTURE WITHIN THE LAST 5 DAYS. NO ANAEROBES ISOLATED Performed at [facility], [address] [place],  [place] [address]    REPTSTATUS 09/14/2022 FINAL 09/09/2022 1453    Cardiac Enzymes: Recent Labs  Lab 09/28/22 0340  CKTOTAL 18*   CBG: Recent Labs  Lab 09/25/22 0335 09/25/22 0749 09/25/22 1109 09/25/22 1546 09/28/22 1617  GLUCAP 82 79 92 112* 99   Iron Studies: No results for input(s): "IRON", "TIBC", "TRANSFERRIN", "FERRITIN" in the last 72 hours. Lab Results  Component Value Date   INR 1.2 08/12/2022   INR 1.3 (H) 06/29/2022   INR 1.3 (H) 06/28/2022   Studies/Results: No results found.  Medications:  sodium chloride 0 mL/hr at 09/22/22 0757   DAPTOmycin (CUBICIN) 600 mg in sodium chloride 0.9 % IVPB Stopped (09/24/22 1759)   DAPTOmycin (CUBICIN) 750 mg in sodium chloride 0.9 % IVPB Stopped (09/26/22 1731)   sodium chloride      (feeding supplement) PROSource Plus  30 mL Oral TID WC   sodium chloride   Intravenous Once   acetaminophen  650 mg Oral Q6H   ascorbic acid  250 mg Oral BID   budesonide  9 mg Oral Daily   calcium acetate  1,334 mg Oral TID WC   Chlorhexidine Gluconate Cloth  6 each Topical Q0600   [START ON 10/01/2022] collagenase   Topical Daily   darbepoetin (ARANESP) injection - DIALYSIS  200 mcg Subcutaneous Q Wed-1800   feeding supplement  1 Container Oral TID BM   liver oil-zinc oxide   Topical TID   methocarbamol  500 mg Oral TID   metoprolol  succinate  25 mg Oral Daily   multivitamin  1 tablet Oral QHS   nutrition supplement (JUVEN)  1 packet Oral BID BM   pantoprazole  40 mg Oral BID   polyethylene glycol  17 g Oral Daily   rosuvastatin  10 mg Oral Daily   sodium chloride flush  10-40 mL Intracatheter Q12H   sodium hypochlorite   Irrigation BID   vitamin A  10,000 Units Oral Daily    Dialysis Orders: TTS SW  4h   350/800  64.5kg  RFA AVG -> clotted/ new TDC in place Heparin none  - last OP HD 3/14, post wt 65.9kg - venofer  IV weekly - rocaltrol 0.25 mcg po tiw - phoslo 2 ac tid - no esa, last Hb 11.0  Assessment/Plan: Recurrent lower GIB: S/p colonoscopy 09/15/22, then repeat sigmoidoscopy 4/11. Several bleeding ulcers addressed.  Path came back + Crohn's - started on mesalamine by GI. Brisk re-bleed 4/18, back for emergent sigmoidoscopy with hemostatic gel applied to bleeding ulcer. Patient started bleeding again from his rectum/ anus -s/p suturing of bleeding ulcers 4/20 by Dr. Magnus Ivan Left common femoral artery pseudoaneurysm - s/p L femoral exploration with pseudoaneurysm repair 08/29/22, then iliofemoral bypass, removal of infected patch with sartorius muscle flap on 09/09/22. Ischemic changes to L 3rd-5th toes - still high risk of limb loss.   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  CVA: + stroke on MRI. Neuro signed off.  ESRD: Continue HD on TTS schedule, did require short-term CRRT 3/23- 3/26. Received HD 4/21-2L removed. Next HD 4/23 ( today )  per his usual schedule. BP/volume: Variable, 1+ edema on exam. Push UF as tolerated Anemia (ESRD + ABLA): Last transfused 1U on 4/20 (that brings total to 24U this admit). Also on Aranesp q Wed (max dose).  Secondary HPTH: CorrCa high but now at goal, Phos ok. Continue  holding VDRA for now.continue phoslo for now. Dialysis access: AVG clotted 3/27, went for  declot by VVS and then re-clotted. VVS placed R TDC on 4/1.  Albumin less than 1.5-  30 days of hosp today -  has palliative care seen him at all ? Would be appropriate to talk about at least DNR  Cecille Aver  [facility] 09/29/2022,8:36 AM  LOS: 32 days

## 2022-09-29 NOTE — Progress Notes (Signed)
[facility]                                                                               [name], is a 67 y.o. male, DOB - 1956/03/14, ZOX:[id number] Admit date - 08/28/2022    Outpatient Primary MD for the patient is [name], NP  LOS - 32  days    Brief summary   67 year old male with history of ESRD on HD TTS, PVD,COPD, essential hypertension, chronic systolic congestive heart failure with ejection fraction 35 to 40%, presented with abdominal pain nausea and diaphoresis.Patient was also hypotensive with a lactic acidosis and was admitted to ICU,with a pulseless left lower extremity was found to have femoral pseudoaneurysm. Vascular surgery consulted and underwent  iliofemoral bypass with sartorius muscle flap and removal of infected left femoral patch. Culture came back with MRSA bacteremia-TEE neg .  Significant Hospital Events; 3/22 Admit, emergent CRRT, to OR for repair of distal Lt external iliac artery 3/24 Extubated. CT head obtained for altered mental status did not show any acute abnormalities 3/26 Off CRRT and pressors, Vascular surgery consulted for clotted AVG 3/27 AV graft thrombectomy attempted in OR but patient became hemodynamically unstable and procedure was aborted.  Was on pressors briefly 3/28 dialysis followed by CVC and aline removal for line holiday 3/29 MRI brain ordered for LLE weakness and found with right subinsular 4mm infarct. Neuro consulted 3/30 CT A/P increased hematoma in left pelvic region associated with Hg drop 9>7 4/1 hgb stable, TEE, new tunneled line planned 4/3 L groin bleeding. Taken to OR urgently by [facility] for iliofemoral bypass, removal of infected patch, sartorius flap.  4/6, 4/7 ongoing rectal bleeding. Transfused both days.  4/6 flex sig with multiple non-bleeding ulcers.  4/8 ongoing rectal bleeding. Transfusing again. Did not drink bowel prep for colonoscopy. Aborted.  PCCM called back. ID recommended 6 weeks  of IV antibiotics followed by po suppression indefinitely.  4/11 two large bloody BM's overnight with Hgb drop from 7.8 to 6.5 with hypotension, moved back to ICU and transferred  4/18 called to the floor for massive hematochezia, intubated, bedside flex sig with bleeding ulcer, hemostatic gel applied 4/19 extubated, bleeding anal ulcers with hypotension. 4/20 S/p EUA and over sewing of ulcers and packing anal canal with gelfoam by Dr Magnus Ivan.   Assessment & Plan    Assessment and Plan:  Acute blood loss anemia from bleeding anal/rectal ulcers possibly from his Crohn's disease Patient underwent flex sigmoidoscopy on 4/6 was found to have 5 nonbleeding ulcers.  He also underwent colonoscopy which showed multiple rectal ulcers with visible bleeding underwent hemostasis. Patient developed massive rectal bleeding again on 4/18. Patient is not a candidate for IR intervention due to severe peripheral vascular disease and anatomy General surgery on board, was taken to the OR  on 4/20 and underwent S/p EUA and over sewing of ulcers and packing anal canal with gelfoam  by Dr Magnus Ivan. Restarted him on Budesonide on 4/19 and continue with mesalamine bid. No more rectal bleeding since 4/20.  Hemoglobin slowly dropping , from 9.1 to 8.1 to 8.8. recheck H&H tonight.  Patient has two soft bowel movements mixed with  blood.    PVD / left common femoral artery pseudoaneurysm S/p left groin hematoma evacuation, primary repair of the distal external iliac artery. Further complicated on 4/3 by left groin bleeding with new pseudoaneurysm and arterial thrombus. S/p iliofemoral bypass and removal of infected patch with sartorius muscle flap. Further management as per vascular surgery. Pain control with oxycodone 5 to 10 mg every 4 hours prb and dilaudid 1 mg every 4 hours prn.  Continue with wound VAC    MRSA bacteremia secondary to postop wound infection with MRSA and Enterococcus faecium. Infectious  disease note from 4/8 recommending daptomycin for 6 weeks followed by indefinite oral suppression of antibiotics. Monitor CPK weekly TEE is negative for vegetations. Patient will need outpatient follow-up with infectious disease to determine oral antibiotics. Last dose of IV antibiotics on Oct 23, 2022 followed by oral antibiotics indefinitely. Mild leukocytosis. Continue to monitor.    End-stage renal disease Nephrology on board and dialysis via right IJ tunneled catheter. Further HD sessions as per nephrology.   Chronic systolic heart failure Last echocardiogram from January 2024 showed left ventricular ejection fraction of 35 to 40% Pulmonary hypertension Restart plavix in the next 24 hours if no bleeding.  Fluid management as per dialysis.    Acute metabolic encephalopathy from uremia, acidosis and acute 4 mm right subinsular stroke Resolved. Pt is alert , following commands.  Once bleeding is controlled, will need antiplatelet therapy for acute infarct.     Retroperitoneal hemorrhage Appears to be Stable   Moderate malnutrition Nutritionist on board, supplementations to be ordered   Hypertension:  BP elevated today,  On metoprolol. Suspect a component of pain.  Will add hydralazine prn.   COPD:  No wheezing heard.  On RA.    RN Pressure Injury Documentation: Pressure Injury 08/28/22 Sacrum Medial Unstageable - Full thickness tissue loss in which the base of the injury is covered by slough (yellow, tan, gray, green or brown) and/or eschar (tan, brown or black) in the wound bed. Sacral wound with eschar coveri (Active)  08/28/22 2228  Location: Sacrum  Location Orientation: Medial  Staging: Unstageable - Full thickness tissue loss in which the base of the injury is covered by slough (yellow, tan, gray, green or brown) and/or eschar (tan, brown or black) in the wound bed.  Wound Description (Comments): Sacral wound with eschar covering wound bed  Present on  Admission: Yes  Dressing Type Honey;Dakin's-soaked gauze;Tape dressing;Moist to dry;ABD 09/29/22 0938     Pressure Injury 09/04/22 Heel Left Deep Tissue Pressure Injury - Purple or maroon localized area of discolored intact skin or blood-filled blister due to damage of underlying soft tissue from pressure and/or shear. (Active)  09/04/22 1200  Location: Heel  Location Orientation: Left  Staging: Deep Tissue Pressure Injury - Purple or maroon localized area of discolored intact skin or blood-filled blister due to damage of underlying soft tissue from pressure and/or shear.  Wound Description (Comments):   Present on Admission:   Dressing Type Foam - Lift dressing to assess site every shift 09/29/22 0938  Wound care on board.    In view of his debility , multiple medical issues, palliative care consulted for goals of care.  Malnutrition Type:  Nutrition Problem: Moderate Malnutrition (in the context of chronic illness (ESRD)) Etiology: poor appetite   Malnutrition Characteristics:  Signs/Symptoms: moderate fat depletion, severe muscle depletion   Nutrition Interventions:  Interventions: Refer to RD note for recommendations, Liberalize Diet, Nepro shake, MVI  Estimated body mass index  is 24.31 kg/m as calculated from the following:   Height as of this encounter: 5' 7.99" (1.727 m).   Weight as of this encounter: 72.5 kg.  Code Status: full code.  DVT Prophylaxis:  SCD's Start: 09/09/22 1841   Level of Care: Level of care: Progressive Family Communication: none at bedside.   Disposition Plan:     Remains inpatient appropriate:  pending PT evaluation.   Procedures:  Bleeding anal ulcer ligation  Consultants:   PCCM,  General surgery.  Vascular surgery.  Gastroenterology.  Neurology. ID  IR  Antimicrobials:   Anti-infectives (From admission, onward)    Start     Dose/Rate Route Frequency Ordered Stop   09/27/22 1200  DAPTOmycin (CUBICIN) 750 mg in sodium  chloride 0.9 % IVPB  Status:  Discontinued        750 mg 130 mL/hr over 30 Minutes Intravenous Daily-1800 09/26/22 1733 09/27/22 0924   09/27/22 1200  DAPTOmycin (CUBICIN) 750 mg in sodium chloride 0.9 % IVPB        750 mg 130 mL/hr over 30 Minutes Intravenous  Once 09/27/22 0923 09/27/22 1500   09/26/22 1800  DAPTOmycin (CUBICIN) 750 mg in sodium chloride 0.9 % IVPB        10 mg/kg  75.2 kg 130 mL/hr over 30 Minutes Intravenous Every Sat (1800) 09/24/22 1342 10/23/22 2359   09/24/22 1800  DAPTOmycin (CUBICIN) 600 mg in sodium chloride 0.9 % IVPB        8 mg/kg  75.2 kg 124 mL/hr over 30 Minutes Intravenous Once per day on Tue Thu 09/24/22 1342 10/23/22 2359   09/19/22 1800  DAPTOmycin (CUBICIN) 750 mg in sodium chloride 0.9 % IVPB  Status:  Discontinued        10 mg/kg  75.2 kg 130 mL/hr over 30 Minutes Intravenous Every Sat (1800) 09/14/22 1622 09/24/22 1342   09/16/22 0830  DAPTOmycin (CUBICIN) 600 mg in sodium chloride 0.9 % IVPB  Status:  Discontinued        8 mg/kg  74.8 kg 124 mL/hr over 30 Minutes Intravenous  Once 09/16/22 0738 09/16/22 1555   09/15/22 1800  DAPTOmycin (CUBICIN) 600 mg in sodium chloride 0.9 % IVPB  Status:  Discontinued        8 mg/kg  75.2 kg 124 mL/hr over 30 Minutes Intravenous Once per day on Tue Thu 09/14/22 1622 09/24/22 1342   09/12/22 1800  DAPTOmycin (CUBICIN) 600 mg in sodium chloride 0.9 % IVPB        600 mg 124 mL/hr over 30 Minutes Intravenous  Once 09/12/22 1049 09/12/22 2033   09/11/22 1615  DAPTOmycin (CUBICIN) 600 mg in sodium chloride 0.9 % IVPB        600 mg 124 mL/hr over 30 Minutes Intravenous  Once 09/11/22 1522 09/11/22 2042   09/10/22 1200  vancomycin (VANCOREADY) IVPB 500 mg/100 mL  Status:  Discontinued        500 mg 100 mL/hr over 60 Minutes Intravenous Every T-Th-Sa (Hemodialysis) 09/09/22 0719 09/10/22 0719   09/10/22 0815  vancomycin (VANCOREADY) IVPB 750 mg/150 mL        750 mg 150 mL/hr over 60 Minutes Intravenous  Once  09/10/22 0719 09/10/22 0939   09/10/22 0718  vancomycin variable dose per unstable renal function (pharmacist dosing)  Status:  Discontinued         Does not apply See admin instructions 09/10/22 0719 09/11/22 1522   09/09/22 2000  piperacillin-tazobactam (ZOSYN) IVPB 2.25 g  Status:  Discontinued        2.25 g 100 mL/hr over 30 Minutes Intravenous Every 8 hours 09/09/22 1849 09/10/22 1003   09/09/22 1145  ceFAZolin (ANCEF) 2-4 GM/100ML-% IVPB       Note to Pharmacy: Payton Emerald A: cabinet override      09/09/22 1145 09/09/22 2359   09/08/22 0830  vancomycin (VANCOREADY) IVPB 750 mg/150 mL        750 mg 150 mL/hr over 60 Minutes Intravenous  Once 09/08/22 0743 09/08/22 1004   09/07/22 1200  ceFAZolin (ANCEF) IVPB 2g/100 mL premix       Note to Pharmacy: Give in IR for surgical prophylaxis   2 g 200 mL/hr over 30 Minutes Intravenous  Once 09/07/22 1100 09/07/22 1327   09/07/22 1101  ceFAZolin (ANCEF) 2-4 GM/100ML-% IVPB       Note to Pharmacy: Phebe Colla N: cabinet override      09/07/22 1101 09/07/22 1335   09/07/22 0734  vancomycin variable dose per unstable renal function (pharmacist dosing)  Status:  Discontinued         Does not apply See admin instructions 09/07/22 0734 09/09/22 0718   09/05/22 1600  vancomycin (VANCOREADY) IVPB 750 mg/150 mL        750 mg 150 mL/hr over 60 Minutes Intravenous  Once 09/05/22 1505 09/05/22 1717   09/02/22 1227  ceFAZolin (ANCEF) 2-4 GM/100ML-% IVPB       Note to Pharmacy: Isabel Caprice, Destiny: cabinet override      09/02/22 1227 09/03/22 0044   09/02/22 0520  vancomycin variable dose per unstable renal function (pharmacist dosing)  Status:  Discontinued         Does not apply See admin instructions 09/02/22 0521 09/05/22 0940   08/31/22 1800  vancomycin (VANCOREADY) IVPB 750 mg/150 mL  Status:  Discontinued        750 mg 150 mL/hr over 60 Minutes Intravenous Every 24 hours 08/31/22 0822 09/02/22 0521   08/30/22 1800  vancomycin (VANCOREADY) IVPB  1500 mg/300 mL        1,500 mg 150 mL/hr over 120 Minutes Intravenous STAT 08/30/22 1739 08/30/22 2139        Medications  Scheduled Meds:  (feeding supplement) PROSource Plus  30 mL Oral TID WC   sodium chloride   Intravenous Once   acetaminophen  650 mg Oral Q6H   ascorbic acid  250 mg Oral BID   budesonide  9 mg Oral Daily   calcium acetate  1,334 mg Oral TID WC   Chlorhexidine Gluconate Cloth  6 each Topical Q0600   [START ON 10/01/2022] collagenase   Topical Daily   darbepoetin (ARANESP) injection - DIALYSIS  200 mcg Subcutaneous Q Wed-1800   feeding supplement  1 Container Oral TID BM   liver oil-zinc oxide   Topical TID   methocarbamol  500 mg Oral TID   metoprolol succinate  25 mg Oral Daily   multivitamin  1 tablet Oral QHS   nutrition supplement (JUVEN)  1 packet Oral BID BM   pantoprazole  40 mg Oral BID   polyethylene glycol  17 g Oral Daily   rosuvastatin  10 mg Oral Daily   sodium chloride flush  10-40 mL Intracatheter Q12H   sodium hypochlorite   Irrigation BID   vitamin A  10,000 Units Oral Daily   Continuous Infusions:  sodium chloride 0 mL/hr at 09/22/22 0757   DAPTOmycin (CUBICIN) 600 mg in sodium chloride 0.9 % IVPB Stopped (09/24/22 1759)  DAPTOmycin (CUBICIN) 750 mg in sodium chloride 0.9 % IVPB Stopped (09/26/22 1731)   sodium chloride     PRN Meds:.alteplase, dicyclomine, heparin, HYDROmorphone (DILAUDID) injection, oxyCODONE, pentafluoroprop-tetrafluoroeth, sodium chloride, traMADol    Subjective:   Albert Harris was seen and examined today. 2 soft BM mixed with blood. Bleeding from the sacral ulcer during hydrotherapy.   Objective:   Vitals:   09/29/22 0800 09/29/22 1030 09/29/22 1100 09/29/22 1200  BP: (!) 190/90 (!) 184/118 (!) 168/81 (!) 163/72  Pulse: 94 84  86  Resp: 13 14 (!) 23 18  Temp:  97.9 F (36.6 C)  98 F (36.7 C)  TempSrc:  Axillary  Oral  SpO2: 95% 97%  100%  Weight:      Height:        Intake/Output Summary (Last  24 hours) at 09/29/2022 1321 Last data filed at 09/29/2022 0700 Gross per 24 hour  Intake --  Output 50 ml  Net -50 ml    Filed Weights   09/26/22 0836 09/27/22 0500 09/27/22 0847  Weight: 68.5 kg 74 kg 72.5 kg     Exam General exam: ill appearing gentleman, in mild distress from pain.  Respiratory system: Clear to auscultation. Respiratory effort normal. Cardiovascular system: S1 & S2 heard, RRR. No JVD,  Gastrointestinal system: Abdomen is nondistended, soft and nontender.  Central nervous system: Alert and oriented. No focal neurological deficits. Extremities: purplish toes on the left 3 rd to 5 th, tender left lowe extremity,  Skin: sacral unstageable ulcer ,  Psychiatry:  Mood & affect appropriate.      Data Reviewed:  I have personally reviewed following labs and imaging studies   CBC Lab Results  Component Value Date   WBC 12.3 (H) 09/29/2022   RBC 2.79 (L) 09/29/2022   HGB 8.8 (L) 09/29/2022   HCT 26.4 (L) 09/29/2022   MCV 94.6 09/29/2022   MCH 31.5 09/29/2022   PLT 198 09/29/2022   MCHC 33.3 09/29/2022   RDW 19.0 (H) 09/29/2022   LYMPHSABS 1.0 09/29/2022   MONOABS 0.7 09/29/2022   EOSABS 0.1 09/29/2022   BASOSABS 0.0 09/29/2022     Last metabolic panel Lab Results  Component Value Date   NA 138 09/29/2022   K 4.6 09/29/2022   CL 99 09/29/2022   CO2 24 09/29/2022   BUN 62 (H) 09/29/2022   CREATININE 6.41 (H) 09/29/2022   GLUCOSE 93 09/29/2022   GFRNONAA 9 (L) 09/29/2022   GFRAA 6 (L) 03/04/2020   CALCIUM 8.4 (L) 09/29/2022   PHOS 5.3 (H) 09/29/2022   PROT 4.5 (L) 09/24/2022   ALBUMIN 1.5 (L) 09/29/2022   LABGLOB 2.8 06/18/2021   AGRATIO 1.3 06/18/2021   BILITOT 1.6 (H) 09/24/2022   ALKPHOS 92 09/24/2022   AST 19 09/24/2022   ALT 11 09/24/2022   ANIONGAP 15 09/29/2022    CBG (last 3)  Recent Labs    09/28/22 1617  GLUCAP 99       Coagulation Profile: No results for input(s): "INR", "PROTIME" in the last 168 hours.   Radiology  Studies: No results found.     Kathlen Mody M.D. [facility] 09/29/2022, 1:21 PM  Available via Epic secure chat 7am-7pm After 7 pm, please refer to night coverage provider listed on amion.

## 2022-09-29 NOTE — Progress Notes (Signed)
Physical Therapy Wound Treatment Patient Details  Name: Albert Harris MRN: [id number] Date of Birth: 12-26-1955  Today's Date: 09/29/2022 Time: 1914-7829 Time Calculation (min): 49 min  Subjective  Subjective Assessment Subjective: Pt pleasant and agreeable to wound therapy Patient and Family Stated Goals: Heal wound Date of Onset:  (unknown) Prior Treatments: Dressing changes  Pain Score:  Pt with increased pain this session despite premedication with PO meds. RN present to provide additional pain meds at end of session.   Wound Assessment  Pressure Injury 08/28/22 Sacrum Medial Unstageable - Full thickness tissue loss in which the base of the injury is covered by slough (yellow, tan, gray, green or brown) and/or eschar (tan, brown or black) in the wound bed. Sacral wound with eschar coveri (Active)  Wound Image   09/29/22 1327  Dressing Type Dakin's-soaked gauze;Foam - Lift dressing to assess site every shift;Gauze (Comment);Barrier Film (skin prep);Moist to moist 09/29/22 1327  Dressing Changed;Clean, Dry, Intact 09/29/22 1327  Dressing Change Frequency Twice a day 09/29/22 1327  State of Healing Eschar 09/29/22 1327  Site / Wound Assessment Pink;Red;Yellow;Brown;Black;Painful 09/29/22 1327  % Wound base Red or Granulating 40% 09/29/22 1327  % Wound base Yellow/Fibrinous Exudate 40% 09/29/22 1327  % Wound base Black/Eschar 20% 09/29/22 1327  % Wound base Other/Granulation Tissue (Comment) 0% 09/29/22 1327  Peri-wound Assessment Erythema (non-blanchable) 09/29/22 1327  Wound Length (cm) 11 cm 09/29/22 1327  Wound Width (cm) 12 cm 09/29/22 1327  Wound Depth (cm) 3.5 cm 09/29/22 1327  Wound Surface Area (cm^2) 132 cm^2 09/29/22 1327  Wound Volume (cm^3) 462 cm^3 09/29/22 1327  Tunneling (cm) 0 09/29/22 1327  Undermining (cm) 3.0 cm 1:00-4:00 09/29/22 1327  Margins Unattached edges (unapproximated) 09/29/22 1327  Drainage Amount Minimal 09/29/22 1327  Drainage Description  Sanguineous 09/29/22 1327  Treatment Debridement (Selective);Irrigation 09/29/22 1327      Selective Debridement (non-excisional) Selective Debridement (non-excisional) - Location: Sacrum Selective Debridement (non-excisional) - Tools Used: Forceps, Scalpel, Scissors Selective Debridement (non-excisional) - Tissue Removed: yellow and black nonviable tissue    Wound Assessment and Plan  Wound Therapy - Assess/Plan/Recommendations Wound Therapy - Clinical Statement: Progressing with debridement and wound bed appears improved. This patient will benefit from continued wound therapy for selective removal of unviable tissue, to decrease bioburden, and promote wound bed healing. Wound Therapy - Functional Problem List: Decreased tolerance for repositioning and OOB due to wound; global weakness in the setting of extended hospitalization and now ICU stay. Factors Delaying/Impairing Wound Healing: Immobility, Multiple medical problems, Polypharmacy Hydrotherapy Plan: Debridement, Dressing change, Patient/family education Wound Therapy - Frequency: 2X / week Wound Therapy - Follow Up Recommendations: dressing changes by RN  Wound Therapy Goals- Improve the function of patient's integumentary system by progressing the wound(s) through the phases of wound healing (inflammation - proliferation - remodeling) by: Wound Therapy Goals - Improve the function of patient's integumentary system by progressing the wound(s) through the phases of wound healing by: Decrease Necrotic Tissue to: 20% Decrease Necrotic Tissue - Progress: Progressing toward goal Increase Granulation Tissue to: 80% Increase Granulation Tissue - Progress: Progressing toward goal Goals/treatment plan/discharge plan were made with and agreed upon by patient/family: Yes Time For Goal Achievement: 7 days Wound Therapy - Potential for Goals: Good  Goals will be updated until maximal potential achieved or discharge criteria met.  Discharge  criteria: when goals achieved, discharge from hospital, MD decision/surgical intervention, no progress towards goals, refusal/missing three consecutive treatments without notification or medical reason.  GP  Charges PT Wound Care Charges $Wound Debridement up to 20 cm: < or equal to 20 cm $ Wound Debridement each add'l 20 sqcm: 3 $PT Hydrotherapy Visit: 1 Visit       [name] 09/29/2022, 1:36 PM  Conni Slipper, PT, DPT [facility] Services Secure Chat Preferred Office: [phone]

## 2022-09-29 NOTE — Progress Notes (Addendum)
  Progress Note    09/29/2022 7:53 AM 3 Days Post-Op  Subjective:  no new overnight events. Left side left leg and foot sore   Vitals:   09/29/22 0203 09/29/22 0403  BP: (!) 149/68 (!) 144/74  Pulse: 82 87  Resp: 15 16  Temp:  98 F (36.7 C)  SpO2: 95% 93%   Physical Exam: Cardiac:  regular Lungs:  non labored Incisions:  left groin with VAC to suction. Good seal Extremities:  Left foot cool. Ischemic changes of 3rd -5th toes and latera aspect of left foot. Sensory intact. Motor non intact. Left ankle and foot very tender to touch. Faint PT and Peroneal signals intact. Right foot warm. Motor and sensation intact Abdomen:  non distended, tender in left lower quadrant Neurologic: alert and oriented  CBC    Component Value Date/Time   WBC 12.8 (H) 09/28/2022 0340   RBC 2.64 (L) 09/28/2022 0340   HGB 8.1 (L) 09/28/2022 0340   HGB 10.4 (L) 06/18/2021 1431   HCT 24.9 (L) 09/28/2022 0340   HCT 31.6 (L) 06/18/2021 1431   PLT 159 09/28/2022 0340   PLT 142 (L) 06/18/2021 1431   MCV 94.3 09/28/2022 0340   MCV 96 06/18/2021 1431   MCH 30.7 09/28/2022 0340   MCHC 32.5 09/28/2022 0340   RDW 18.6 (H) 09/28/2022 0340   RDW 14.3 06/18/2021 1431   LYMPHSABS 1.0 09/28/2022 0340   LYMPHSABS 1.5 06/18/2021 1431   MONOABS 0.9 09/28/2022 0340   EOSABS 0.2 09/28/2022 0340   EOSABS 1.0 (H) 06/18/2021 1431   BASOSABS 0.0 09/28/2022 0340   BASOSABS 0.1 06/18/2021 1431    BMET    Component Value Date/Time   NA 138 09/29/2022 0630   NA 141 06/18/2021 1431   K 4.6 09/29/2022 0630   CL 101 09/29/2022 0630   CO2 26 09/29/2022 0630   GLUCOSE 75 09/29/2022 0630   BUN 57 (H) 09/29/2022 0630   BUN 20 06/18/2021 1431   CREATININE 5.99 (H) 09/29/2022 0630   CREATININE 1.15 02/19/2017 1049   CALCIUM 8.3 (L) 09/29/2022 0630   CALCIUM 8.5 (L) 06/10/2020 0900   GFRNONAA 10 (L) 09/29/2022 0630   GFRAA 6 (L) 03/04/2020 1045    INR    Component Value Date/Time   INR 1.2 08/12/2022 1501      Intake/Output Summary (Last 24 hours) at 09/29/2022 0753 Last data filed at 09/29/2022 0700 Gross per 24 hour  Intake --  Output 50 ml  Net -50 ml     Assessment/Plan:  67 y.o. male is s/p removal of infected left femoral patch, iliofemoral bypass, with sartorius muscle flap 09/09/22   LLE remains stable with faint pt/pero signals. Ischemic changes to toes and toes cooler now. Still very high risk for amputation Left groin with VAC to suction. Wound very healthy and well appearing during yesterdays wound vac change Left groin VAC change next on Thursday No further rectal bleeding events since weekend H&H stable Remains on Cubicin Mobilize as tolerated Pending dispo to SNF   Graceann Congress, PA-C [facility] [phone] 09/29/2022 7:53 AM  VASCULAR STAFF ADDENDUM: I have independently interviewed and examined the patient. I agree with the above.  VAC T-Th Will treat lower extremity pain with multimodal pain control at this time.  Will continue to follow  J. Gillis Santa, MD [facility] Phone Number: [phone] 09/29/2022 10:28 AM

## 2022-09-30 DIAGNOSIS — R6521 Severe sepsis with septic shock: Secondary | ICD-10-CM | POA: Diagnosis not present

## 2022-09-30 DIAGNOSIS — A419 Sepsis, unspecified organism: Secondary | ICD-10-CM | POA: Diagnosis not present

## 2022-09-30 LAB — TYPE AND SCREEN
ABO/RH(D): O POS
Antibody Screen: NEGATIVE
Unit division: 0
Unit division: 0
Unit division: 0

## 2022-09-30 LAB — CBC WITH DIFFERENTIAL/PLATELET
Abs Immature Granulocytes: 0.11 10*3/uL — ABNORMAL HIGH (ref 0.00–0.07)
Basophils Absolute: 0.1 10*3/uL (ref 0.0–0.1)
Basophils Relative: 1 %
Eosinophils Absolute: 0.4 10*3/uL (ref 0.0–0.5)
Eosinophils Relative: 3 %
HCT: 26.6 % — ABNORMAL LOW (ref 39.0–52.0)
Hemoglobin: 8.7 g/dL — ABNORMAL LOW (ref 13.0–17.0)
Immature Granulocytes: 1 %
Lymphocytes Relative: 10 %
Lymphs Abs: 1.3 10*3/uL (ref 0.7–4.0)
MCH: 31.4 pg (ref 26.0–34.0)
MCHC: 32.7 g/dL (ref 30.0–36.0)
MCV: 96 fL (ref 80.0–100.0)
Monocytes Absolute: 1.1 10*3/uL — ABNORMAL HIGH (ref 0.1–1.0)
Monocytes Relative: 8 %
Neutro Abs: 10.6 10*3/uL — ABNORMAL HIGH (ref 1.7–7.7)
Neutrophils Relative %: 77 %
Platelets: 211 10*3/uL (ref 150–400)
RBC: 2.77 MIL/uL — ABNORMAL LOW (ref 4.22–5.81)
RDW: 19.9 % — ABNORMAL HIGH (ref 11.5–15.5)
WBC: 13.6 10*3/uL — ABNORMAL HIGH (ref 4.0–10.5)
nRBC: 0 % (ref 0.0–0.2)

## 2022-09-30 LAB — RENAL FUNCTION PANEL
Albumin: 1.6 g/dL — ABNORMAL LOW (ref 3.5–5.0)
Anion gap: 12 (ref 5–15)
BUN: 46 mg/dL — ABNORMAL HIGH (ref 8–23)
CO2: 27 mmol/L (ref 22–32)
Calcium: 7.9 mg/dL — ABNORMAL LOW (ref 8.9–10.3)
Chloride: 97 mmol/L — ABNORMAL LOW (ref 98–111)
Creatinine, Ser: 4.37 mg/dL — ABNORMAL HIGH (ref 0.61–1.24)
GFR, Estimated: 14 mL/min — ABNORMAL LOW (ref >60)
Glucose, Bld: 99 mg/dL (ref 70–99)
Phosphorus: 3.9 mg/dL (ref 2.5–4.6)
Potassium: 4.1 mmol/L (ref 3.5–5.1)
Sodium: 136 mmol/L (ref 135–145)

## 2022-09-30 LAB — BPAM RBC
Blood Product Expiration Date: 202405162359
Blood Product Expiration Date: 202405172359
Blood Product Expiration Date: 202405202359
Blood Product Expiration Date: 202405262359
ISSUE DATE / TIME: 202404212132
ISSUE DATE / TIME: 202404241827
Unit Type and Rh: 5100
Unit Type and Rh: 5100

## 2022-09-30 LAB — PREPARE RBC (CROSSMATCH)

## 2022-09-30 LAB — HEMOGLOBIN AND HEMATOCRIT, BLOOD
HCT: 25.2 % — ABNORMAL LOW (ref 39.0–52.0)
Hemoglobin: 7.8 g/dL — ABNORMAL LOW (ref 13.0–17.0)

## 2022-09-30 MED ORDER — CHLORHEXIDINE GLUCONATE CLOTH 2 % EX PADS
6.0000 | MEDICATED_PAD | Freq: Every day | CUTANEOUS | Status: DC
  Administered 2022-09-30 – 2022-10-02 (×3): 6 via TOPICAL

## 2022-09-30 MED ORDER — SODIUM CHLORIDE 0.9% IV SOLUTION: Freq: Once | INTRAVENOUS | Status: AC

## 2022-09-30 NOTE — Progress Notes (Signed)
Occupational Therapy Treatment Patient Details Name: Albert Harris MRN: [id number] DOB: May 09, 1956 Today's Date: 09/30/2022   History of present illness 67 year old male  ER with abdominal pain, nausea, diaphoresis and unable to feel his Lt foot. Hypotensive with lactic acidosis in ER.  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 CRRT 3/22-3/26.  3/22 repair of distal Lt external iliac artery. On 3/30, Hgb drop from 9 to 7 with worsening abdominal pain, CT abd 3/30 shows slightly larger anterior pelvic extraperitoneal hematoma. 4/18 -  massive hemoptysis requiring 4 units PRBC, bedside flex sig with bleeding ulcer, hemostatic gel applied, ETT 4/18-19. PMHx: PAD, ESRD HD, recently hospitalized 3/16-18 for acute on chronic left lower extremity ischemia.   OT comments  Pt progressing towards OT goals this session. Sat EOB improving balance, seated activity tolerance for 10-15 min. Pt actively engaged in UB ADL and grooming during this time in addition to exercises. Pt also provided with 2 theraband (his one he had previously had gone missing) Pt able to demonstrate multiple UE exercises in supine. Pt also improved assist for rolling for rear peri care/linen change. Pt continues to benefit from skilled OT - therapy team also reached out to MD for prevalon boot for L Foot. OT will continue to follow acutely.   Recommendations for follow up therapy are one component of a multi-disciplinary discharge planning process, led by the attending physician.  Recommendations may be updated based on patient status, additional functional criteria and insurance authorization.    Assistance Recommended at Discharge Intermittent Supervision/Assistance  Patient can return home with the following  A lot of help with walking and/or transfers;A lot of help with bathing/dressing/bathroom;Assistance with cooking/housework;Assist for transportation;Help with stairs or ramp for entrance   Equipment  Recommendations  Other (comment) (defer to next venue of care)    Recommendations for Other Services      Precautions / Restrictions Precautions Precautions: Fall Precaution Comments: L groin wound vac; Per verbal order of Dr. Chestine Spore and Dr. Lenell Antu 4/19 - unrestricted L hip ROM, pt appropriate to move OOB and mobilize as appropriate. Restrictions Weight Bearing Restrictions: Yes LLE Weight Bearing: Weight bearing as tolerated       Mobility Bed Mobility Overal bed mobility: Needs Assistance Bed Mobility: Rolling, Sit to Sidelying, Sidelying to Sit Rolling: Mod assist Sidelying to sit: +2 for physical assistance, Max assist     Sit to sidelying: Mod assist, +2 for physical assistance General bed mobility comments: assist for log roll to and from R EOB to prevent shearing on buttocks, maxA +2 for trunk elevation and modA +2 for trunk lowering and LE translation to/from EOB, and repositioning once returned to supine. Pt additionally rolled to L/R sides for removal of bed pad with dried blood on it, RN notified of blood.    Transfers Overall transfer level: Needs assistance                Lateral/Scoot Transfers: Total assist, +2 physical assistance General transfer comment: standing NT - sizewise bed too high off the ground, limited by BP, fatigue and pain after sitting EOB. +2 totalA for lateral scoot, pt attempting to assist with chair push-up to deweight hips from air mattress and PTA/OT lifting/laterally shifting pt toward [facility] to prevent skin shearing. Pt assisting but more than maxA needed for his safety.     Balance Overall balance assessment: Needs assistance Sitting-balance support: Bilateral upper extremity supported, Single extremity supported, No upper extremity supported, Feet unsupported Sitting balance-Leahy  Scale: Fair Sitting balance - Comments: fair to poor, periods of supervision transitioning to min assist given fatigue and posterior truncal bias, air  mattress edge pushing pt back into posterior lean, pt does better when therapists assisting to compress edge of bed for more level surface. Postural control: Posterior lean     Standing balance comment: defer for pt safety, not yet safe to attempt                           ADL either performed or assessed with clinical judgement   ADL Overall ADL's : Needs assistance/impaired     Grooming: Wash/dry face;Min guard;Sitting Grooming Details (indicate cue type and reason): EOB Upper Body Bathing: Moderate assistance Upper Body Bathing Details (indicate cue type and reason): for back while maintaining seated balance EOB         Lower Body Dressing: Maximal assistance;Bed level Lower Body Dressing Details (indicate cue type and reason): to don socks     Toileting- Clothing Manipulation and Hygiene: Moderate assistance;Bed level Toileting - Clothing Manipulation Details (indicate cue type and reason): rolling and gentle rear peri care Pt assisted with rolling, peri care done by therapist       General ADL Comments: Pt tolerating EOB well, increasing activity tolerance each time and eager to participate in ADL and grooming tasks    Extremity/Trunk Assessment Upper Extremity Assessment Upper Extremity Assessment: Overall WFL for tasks assessed            Vision       Perception     Praxis      Cognition Arousal/Alertness: Awake/alert Behavior During Therapy: WFL for tasks assessed/performed Overall Cognitive Status: Within Functional Limits for tasks assessed                                 General Comments: Pt cooperative and and gave great effort t/o session. Pt anxious in anticipation of pain but agreeable.        Exercises Other Exercises Other Exercises: lateral and posterior lean to his L side propping on elbow ~30 seconds to offload bottom and promote tolerance to weight shift to midline, pt with improved midline posture after  completing this. Other Exercises: sidelying LUE AROM: pushing/pulling to/away from bed side rail x5 reps x2 sets for UE strengthening Other Exercises: BUE chair push-ups x3 reps seated EOB with emphasis on deweighting hips Other Exercises: red theraband provided for 2 bed rails and BUE exercises    Shoulder Instructions       General Comments BP 107/71 (84) seated at 12:13pm no dizziness; BP 114/74 (83) seated at 12:20pm; SpO2 100% and HR 76 bpm supine at rest.    Pertinent Vitals/ Pain       Pain Assessment Pain Assessment: Faces Faces Pain Scale: Hurts even more Pain Location: bottom>LLE Pain Descriptors / Indicators: Grimacing, Guarding, Sore, Constant, Discomfort Pain Intervention(s): Limited activity within patient's tolerance, Monitored during session, Premedicated before session, Repositioned  Home Living                                          Prior Functioning/Environment              Frequency  Min 2X/week        Progress Toward Goals  OT  Goals(current goals can now be found in the care plan section)  Progress towards OT goals: Progressing toward goals  Acute Rehab OT Goals Patient Stated Goal: get home to see grandkids OT Goal Formulation: With patient/family Time For Goal Achievement: 10/12/22 Potential to Achieve Goals: Good  Plan Discharge plan remains appropriate    Co-evaluation    PT/OT/SLP Co-Evaluation/Treatment: Yes Reason for Co-Treatment: Complexity of the patient's impairments (multi-system involvement);For patient/therapist safety;To address functional/ADL transfers PT goals addressed during session: Strengthening/ROM;Mobility/safety with mobility;Balance OT goals addressed during session: ADL's and self-care;Strengthening/ROM      AM-PAC OT "6 Clicks" Daily Activity     Outcome Measure   Help from another person eating meals?: A Little Help from another person taking care of personal grooming?: A Little Help  from another person toileting, which includes using toliet, bedpan, or urinal?: Total Help from another person bathing (including washing, rinsing, drying)?: A Lot Help from another person to put on and taking off regular upper body clothing?: A Lot Help from another person to put on and taking off regular lower body clothing?: Total 6 Click Score: 12    End of Session Equipment Utilized During Treatment: Other (comment) (red theraband)  OT Visit Diagnosis: Unsteadiness on feet (R26.81);Muscle weakness (generalized) (M62.81);Dizziness and giddiness (R42)   Activity Tolerance Patient tolerated treatment well   Patient Left in bed;with call bell/phone within reach;with family/visitor present   Nurse Communication Mobility status;Need for lift equipment        Time: 1610-9604 OT Time Calculation (min): 35 min  Charges: OT General Charges $OT Visit: 1 Visit OT Treatments $Self Care/Home Management : 8-22 mins  Nyoka Cowden OTR/L Acute Rehabilitation Services Office: [phone]  Evern Bio [facility] 09/30/2022, 2:48 PM

## 2022-09-30 NOTE — TOC Progression Note (Addendum)
Transition of Care [facility]) - Progression Note    Patient Details  Name: Albert Harris MRN: [id number] Date of Birth: 1955/08/03  Transition of Care [facility]) CM/SW Contact  Delilah Shan, LCSWA Phone Number: 09/30/2022, 4:30 PM  Clinical Narrative:     CSW following for SNF placement. CSW spoke with patients daughter [name] who confirmed she accepted SNF bed with [facility] for patient.CSW following to start auth. Closer to patient being medically ready for dc. CSW will continue to follow and assist with patients dc planning needs.  Expected Discharge Plan: Skilled Nursing Facility Barriers to Discharge: Continued Medical Work up  Expected Discharge Plan and Services In-house Referral: Clinical Social Work   Post Acute Care Choice: Skilled Nursing Facility Living arrangements for the past 2 months: Single Family Home                                       Social Determinants of Health (SDOH) Interventions SDOH Screenings   Food Insecurity: No Food Insecurity (09/29/2022)  Recent Concern: Food Insecurity - Food Insecurity Present (08/22/2022)  Housing: Low Risk  (09/29/2022)  Transportation Needs: No Transportation Needs (09/29/2022)  Recent Concern: Transportation Needs - Unmet Transportation Needs (08/22/2022)  Utilities: Not At Risk (09/29/2022)  Alcohol Screen: Low Risk  (09/12/2019)  Depression (PHQ2-9): High Risk (05/07/2022)  Financial Resource Strain: Low Risk  (09/12/2019)  Stress: No Stress Concern Present (09/12/2019)  Tobacco Use: Medium Risk (09/29/2022)    Readmission Risk Interventions    08/24/2022   11:27 AM 08/14/2022   10:07 AM 07/24/2022   11:52 AM  Readmission Risk Prevention Plan  Transportation Screening Complete Complete Complete  Medication Review (RN Care Manager) Referral to Pharmacy Referral to Pharmacy Complete  PCP or Specialist appointment within 3-5 days of discharge  Complete Complete  HRI or Home Care Consult Complete Patient refused Complete   SW Recovery Care/Counseling Consult Complete Complete Patient refused  Palliative Care Screening Not Applicable Not Applicable Not Applicable  Skilled Nursing Facility Not Applicable Not Applicable Not Applicable

## 2022-09-30 NOTE — Progress Notes (Signed)
  Progress Note    09/30/2022 8:00 AM 4 Days Post-Op  Subjective:  had a lot of rectal bleeding overnight   Vitals:   09/29/22 2358 09/30/22 0722  BP: (!) 161/63 (!) 164/74  Pulse: 72 67  Resp: 14 13  Temp: 98.6 F (37 C) 98.2 F (36.8 C)  SpO2: 100% 98%   Physical Exam: Cardiac:  regular Lungs:  non labored Incisions: Left groin with VAC to suction Extremities: LLE with faint PT/Pero signals. Foot/toes cool Neurologic: alert and oriented  CBC    Component Value Date/Time   WBC 12.3 (H) 09/29/2022 0957   RBC 2.79 (L) 09/29/2022 0957   HGB 8.8 (L) 09/29/2022 0957   HGB 10.4 (L) 06/18/2021 1431   HCT 26.4 (L) 09/29/2022 0957   HCT 31.6 (L) 06/18/2021 1431   PLT 198 09/29/2022 0957   PLT 142 (L) 06/18/2021 1431   MCV 94.6 09/29/2022 0957   MCV 96 06/18/2021 1431   MCH 31.5 09/29/2022 0957   MCHC 33.3 09/29/2022 0957   RDW 19.0 (H) 09/29/2022 0957   RDW 14.3 06/18/2021 1431   LYMPHSABS 1.0 09/29/2022 0957   LYMPHSABS 1.5 06/18/2021 1431   MONOABS 0.7 09/29/2022 0957   EOSABS 0.1 09/29/2022 0957   EOSABS 1.0 (H) 06/18/2021 1431   BASOSABS 0.0 09/29/2022 0957   BASOSABS 0.1 06/18/2021 1431    BMET    Component Value Date/Time   NA 138 09/29/2022 0957   NA 141 06/18/2021 1431   K 4.6 09/29/2022 0957   CL 99 09/29/2022 0957   CO2 24 09/29/2022 0957   GLUCOSE 93 09/29/2022 0957   BUN 62 (H) 09/29/2022 0957   BUN 20 06/18/2021 1431   CREATININE 6.41 (H) 09/29/2022 0957   CREATININE 1.15 02/19/2017 1049   CALCIUM 8.4 (L) 09/29/2022 0957   CALCIUM 8.5 (L) 06/10/2020 0900   GFRNONAA 9 (L) 09/29/2022 0957   GFRAA 6 (L) 03/04/2020 1045    INR    Component Value Date/Time   INR 1.2 08/12/2022 1501     Intake/Output Summary (Last 24 hours) at 09/30/2022 0800 Last data filed at 09/30/2022 1610 Gross per 24 hour  Intake 240 ml  Output 3050 ml  Net -2810 ml     Assessment/Plan:  68 y.o. male is s/p removal of infected left femoral patch, iliofemoral  bypass, with sartorius muscle flap 09/09/22  4 Days Post-Op   LLE remains stable with faint pt/pero signals. Still very high risk for amputation Continue multimodal pain control Left groin with VAC to suction. Next Left groin VAC change tomorrow More rectal bleeding overnight  Morning labs pending Remains on Cubicin Will need to eventually resume Asa/plavix but with continued bleeding will need to hold Continue Therapies as able  Graceann Congress, PA-C [facility] [phone] 09/30/2022 8:00 AM

## 2022-09-30 NOTE — Progress Notes (Signed)
PROGRESS NOTE    Albert Harris  ZOX:[id number] DOB: 1955/08/02 DOA: 08/28/2022 PCP: [name], NP    Brief Narrative:  67 year old male with history of ESRD on HD TTS, PVD,COPD, essential hypertension, chronic systolic congestive heart failure with ejection fraction 35 to 40%, presented with abdominal pain nausea and diaphoresis.Patient was also hypotensive with a lactic acidosis and was admitted to ICU,with a pulseless left lower extremity was found to have femoral pseudoaneurysm. Vascular surgery consulted and underwent  iliofemoral bypass with sartorius muscle flap and removal of infected left femoral patch. Culture came back with MRSA bacteremia-TEE neg .   Significant Hospital Events; 3/22 Admit, emergent CRRT, to OR for repair of distal Lt external iliac artery 3/24 Extubated. CT head obtained for altered mental status did not show any acute abnormalities 3/26 Off CRRT and pressors, Vascular surgery consulted for clotted AVG 3/27 AV graft thrombectomy attempted in OR but patient became hemodynamically unstable and procedure was aborted.  Was on pressors briefly 3/28 dialysis followed by CVC and aline removal for line holiday 3/29 MRI brain ordered for LLE weakness and found with right subinsular 4mm infarct. Neuro consulted 3/30 CT A/P increased hematoma in left pelvic region associated with Hg drop 9>7 4/1 hgb stable, TEE, new tunneled line planned 4/3 L groin bleeding. Taken to OR urgently by [facility] for iliofemoral bypass, removal of infected patch, sartorius flap.  4/6, 4/7 ongoing rectal bleeding. Transfused both days.  4/6 flex sig with multiple non-bleeding ulcers.  4/8 ongoing rectal bleeding. Transfusing again. Did not drink bowel prep for colonoscopy. Aborted.  PCCM called back. ID recommended 6 weeks of IV antibiotics followed by po suppression indefinitely.  4/11 two large bloody BM's overnight with Hgb drop from 7.8 to 6.5 with hypotension, moved back to ICU and  transferred  4/18 called to the floor for massive hematochezia, intubated, bedside flex sig with bleeding ulcer, hemostatic gel applied 4/19 extubated, bleeding anal ulcers with hypotension. 4/20 S/p EUA and over sewing of ulcers and packing anal canal with gelfoam by Dr Magnus Ivan.  4/24: Notified by bedside RN this a.m. that patient had more bright red blood per rectum overnight.  Hemoglobin 8.7 this morning.  Relatively stable.  Remains hemodynamically stable.   Assessment & Plan:   Principal Problem:   Septic shock Active Problems:   Lower GI bleed   Metabolic encephalopathy   Lactic acidosis   ESRD (end stage renal disease) on dialysis   Malnutrition of moderate degree   Pseudoaneurysm of femoral artery   Decreased functional mobility   MRSA (methicillin resistant Staphylococcus aureus) septicemia   Pressure injury of skin   Colonic ulcer   Rectal arterial hemorrhage   Rectal ulcer   Hematochezia   Acute blood loss anemia   MRSA bacteremia   Lower GI bleeding  Acute blood loss anemia from bleeding anal/rectal ulcers possibly from his Crohn's disease Patient underwent flex sigmoidoscopy on 4/6 was found to have 5 nonbleeding ulcers.  He also underwent colonoscopy which showed multiple rectal ulcers with visible bleeding underwent hemostasis. Patient developed massive rectal bleeding again on 4/18. Patient is not a candidate for IR intervention due to severe peripheral vascular disease and anatomy General surgery on board, was taken to the OR  on 4/20 and underwent S/p EUA and over sewing of ulcers and packing anal canal with gelfoam  by Dr Magnus Ivan. Restarted him on Budesonide on 4/19 and continue with mesalamine bid.  4/24: had more bright red blood per rectum last  night.  Hemoglobin this morning 8.7.  Relatively stable. Plan: No transfusion at this time Trend hemoglobin every 6 hours Transfuse as needed for further drops Per last surgical notes there may be no options for  intervention should the patient have any hemodynamically significant bleeding.  See surgical note from Dr. Magnus Ivan 4/21.  Poor prognosis.  Strongly recommend DNR status.  Patient remains full code at this time.    PVD / left common femoral artery pseudoaneurysm S/p left groin hematoma evacuation, primary repair of the distal external iliac artery. Further complicated on 4/3 by left groin bleeding with new pseudoaneurysm and arterial thrombus. S/p iliofemoral bypass and removal of infected patch with sartorius muscle flap. Further management as per vascular surgery. Pain control with oxycodone 5 to 10 mg every 4 hours prb and dilaudid 1 mg every 4 hours prn.  Continue with wound VAC       MRSA bacteremia secondary to postop wound infection with MRSA and Enterococcus faecium. Infectious disease note from 4/8 recommending daptomycin for 6 weeks followed by indefinite oral suppression of antibiotics. Monitor CPK weekly TEE is negative for vegetations. Patient will need outpatient follow-up with infectious disease to determine oral antibiotics. Last dose of IV antibiotics on Oct 23, 2022 followed by oral antibiotics indefinitely. Mild leukocytosis. Continue to monitor.      End-stage renal disease Nephrology on board and dialysis via right IJ tunneled catheter. Further HD sessions as per nephrology.    Chronic systolic heart failure Last echocardiogram from January 2024 showed left ventricular ejection fraction of 35 to 40% Pulmonary hypertension Cannot restart antiplatelet agents at this time Fluid management as per dialysis.       Acute metabolic encephalopathy from uremia, acidosis and acute 4 mm right subinsular stroke Resolved. Pt is alert , following commands.  If/once bleeding is controlled, will need antiplatelet therapy for acute infarct.         Retroperitoneal hemorrhage Appears to be Stable     Moderate malnutrition Nutritionist on board, supplementations to be  ordered     Hypertension:  BP elevated today,  On metoprolol. Suspect a component of pain.  Continue with hydralazine as needed   COPD:  No wheezing heard.  On RA.    DVT prophylaxis: SCD Code Status: Full Family Communication: Daughter at bedside 4/24 Disposition Plan: Status is: Inpatient Remains inpatient appropriate because: Multiple acute issues.  Poor prognosis.   Level of care: Progressive  Consultants:  Vascular Nephrology  Procedures:  Multiple during hospitalization  Antimicrobials: Daptomycin   Subjective: Seen and examined.  Resting in bed.  Appears pale but otherwise stable.  No pain complaints.  Daughter at bedside.  Objective: Vitals:   09/29/22 1838 09/29/22 2052 09/29/22 2358 09/30/22 0722  BP: (!) 166/85 (!) 163/69 (!) 161/63 (!) 164/74  Pulse: 77 74 72 67  Resp: Temp:  98.6 F (37 C) 98.6 F (37 C) 98.2 F (36.8 C)  TempSrc:  Oral Oral Oral  SpO2: 100% 99% 100% 98%  Weight:      Height:        Intake/Output Summary (Last 24 hours) at 09/30/2022 1331 Last data filed at 09/30/2022 0800 Gross per 24 hour  Intake 240 ml  Output 3050 ml  Net -2810 ml   Filed Weights   09/27/22 0500 09/27/22 0847 09/29/22 1529  Weight: 74 kg 72.5 kg 80.5 kg    Examination:  General exam: NAD.  Ill-appearing.  Deconditioned Respiratory system: Clear to auscultation.  Respiratory effort normal. Cardiovascular system: S1-S2, RRR, no murmurs, no pedal edema Gastrointestinal system: Soft, NT/ND, normal bowel sounds Central nervous system: Alert and oriented. No focal neurological deficits. Extremities: Purple discoloration of toes on left lower extremity Skin: Unstageable sacral ulcer Psychiatry: Judgement and insight appear normal. Mood & affect flattened.     Data Reviewed: I have personally reviewed following labs and imaging studies  CBC: Recent Labs  Lab 09/24/22 0400 09/25/22 0509 09/25/22 2240 09/26/22 0912 09/26/22 1715  09/28/22 0340 09/29/22 0957 09/30/22 0803  WBC 21.0* 14.3*  --   --   --  12.8* 12.3* 13.6*  NEUTROABS  --   --   --   --   --  10.7* 10.3* 10.6*  HGB 11.6* 10.1*   < > 9.5* 9.1* 8.1* 8.8* 8.7*  HCT 34.2* 29.9*   < > 28.0* 26.6* 24.9* 26.4* 26.6*  MCV 90.0 89.5  --   --   --  94.3 94.6 96.0  PLT 179 119*  --   --   --  159 198 211   < > = values in this interval not displayed.   Basic Metabolic Panel: Recent Labs  Lab 09/24/22 0400 09/25/22 0509 09/26/22 0217 09/26/22 0912 09/27/22 0230 09/28/22 0340 09/29/22 0630 09/29/22 0957 09/30/22 0803  NA 136   < > 133*   < > 135 136 138 138 136  K 4.3   < > 3.8   < > 4.3 3.6 4.6 4.6 4.1  CL 97*   < > 95*   < > 97* 97* 101 99 97*  CO2 25   < > 25  --  24 27 26 24 27   GLUCOSE 121*   < > 109*   < > 85 101* 75 93 99  BUN 54*   < > 48*   < > 61* 36* 57* 62* 46*  CREATININE 5.69*   < > 5.59*   < > 7.14* 4.69* 5.99* 6.41* 4.37*  CALCIUM 7.9*   < > 7.8*  --  7.7* 7.4* 8.3* 8.4* 7.9*  MG 1.9  --   --   --   --   --   --   --   --   PHOS 5.7*   < > 4.1  --  6.1* 3.7 5.3*  --  3.9   < > = values in this interval not displayed.   GFR: Estimated Creatinine Clearance: 15.7 mL/min (A) (by C-G formula based on SCr of 4.37 mg/dL (H)). Liver Function Tests: Recent Labs  Lab 09/24/22 0400 09/25/22 0509 09/26/22 0217 09/27/22 0230 09/28/22 0340 09/29/22 0630 09/30/22 0803  AST 19  --   --   --   --   --   --   ALT 11  --   --   --   --   --   --   ALKPHOS 92  --   --   --   --   --   --   BILITOT 1.6*  --   --   --   --   --   --   PROT 4.5*  --   --   --   --   --   --   ALBUMIN 1.7*   < > 1.6* <1.5* <1.5* 1.5* 1.6*   < > = values in this interval not displayed.   No results for input(s): "LIPASE", "AMYLASE" in the last 168 hours. No results for input(s): "AMMONIA" in the  last 168 hours. Coagulation Profile: No results for input(s): "INR", "PROTIME" in the last 168 hours. Cardiac Enzymes: Recent Labs  Lab 09/28/22 0340  CKTOTAL  18*   BNP (last 3 results) No results for input(s): "PROBNP" in the last 8760 hours. HbA1C: No results for input(s): "HGBA1C" in the last 72 hours. CBG: Recent Labs  Lab 09/25/22 0335 09/25/22 0749 09/25/22 1109 09/25/22 1546 09/28/22 1617  GLUCAP 82 79 92 112* 99   Lipid Profile: No results for input(s): "CHOL", "HDL", "LDLCALC", "TRIG", "CHOLHDL", "LDLDIRECT" in the last 72 hours. Thyroid Function Tests: No results for input(s): "TSH", "T4TOTAL", "FREET4", "T3FREE", "THYROIDAB" in the last 72 hours. Anemia Panel: No results for input(s): "VITAMINB12", "FOLATE", "FERRITIN", "TIBC", "IRON", "RETICCTPCT" in the last 72 hours. Sepsis Labs: No results for input(s): "PROCALCITON", "LATICACIDVEN" in the last 168 hours.  No results found for this or any previous visit (from the past 240 hour(s)).       Radiology Studies: No results found.      Scheduled Meds:  (feeding supplement) PROSource Plus  30 mL Oral TID WC   sodium chloride   Intravenous Once   acetaminophen  650 mg Oral Q6H   ascorbic acid  250 mg Oral BID   budesonide  9 mg Oral Daily   calcium acetate  1,334 mg Oral TID WC   Chlorhexidine Gluconate Cloth  6 each Topical Q0600   Chlorhexidine Gluconate Cloth  6 each Topical Q0600   [START ON 10/01/2022] collagenase   Topical Daily   darbepoetin (ARANESP) injection - DIALYSIS  200 mcg Subcutaneous Q Wed-1800   feeding supplement  1 Container Oral TID BM   liver oil-zinc oxide   Topical TID   methocarbamol  500 mg Oral TID   metoprolol succinate  25 mg Oral Daily   multivitamin  1 tablet Oral QHS   nutrition supplement (JUVEN)  1 packet Oral BID BM   pantoprazole  40 mg Oral BID   polyethylene glycol  17 g Oral Daily   rosuvastatin  10 mg Oral Daily   sodium chloride flush  10-40 mL Intracatheter Q12H   sodium hypochlorite   Irrigation BID   vitamin A  10,000 Units Oral Daily   Continuous Infusions:  sodium chloride 0 mL/hr at 09/22/22 0757    DAPTOmycin (CUBICIN) 600 mg in sodium chloride 0.9 % IVPB 600 mg (09/29/22 2050)   DAPTOmycin (CUBICIN) 750 mg in sodium chloride 0.9 % IVPB Stopped (09/26/22 1731)   sodium chloride       LOS: 33 days   Tresa Moore, MD [facility]   If 7PM-7AM, please contact night-coverage  09/30/2022, 1:31 PM

## 2022-09-30 NOTE — Progress Notes (Signed)
[facility] Progress Note   Subjective:    HD yest-  removed 3 liters tolerated well -  in good spirits today   Objective Vitals:   09/29/22 1838 09/29/22 2052 09/29/22 2358 09/30/22 0722  BP: (!) 166/85 (!) 163/69 (!) 161/63 (!) 164/74  Pulse: 77 74 72 67  Resp: Temp:  98.6 F (37 C) 98.6 F (37 C) 98.2 F (36.8 C)  TempSrc:  Oral Oral Oral  SpO2: 100% 99% 100% 98%  Weight:      Height:       Physical Exam General: Awake, alert,  On RA, NAD Heart: RRR; no murmur Lungs: CTA anteriorly Abdomen: soft Extremities: dep edema; L foot with ischemic distal 3rd-5th toes Dialysis Access: [facility]  Filed Weights   09/27/22 0500 09/27/22 0847 09/29/22 1529  Weight: 74 kg 72.5 kg 80.5 kg    Intake/Output Summary (Last 24 hours) at 09/30/2022 0931 Last data filed at 09/30/2022 0800 Gross per 24 hour  Intake 240 ml  Output 3050 ml  Net -2810 ml    Additional Objective Labs: Basic Metabolic Panel: Recent Labs  Lab 09/28/22 0340 09/29/22 0630 09/29/22 0957 09/30/22 0803  NA 136 138 138 136  K 3.6 4.6 4.6 4.1  CL 97* 101 99 97*  CO2 GLUCOSE 101* 75 93 99  BUN 36* 57* 62* 46*  CREATININE 4.69* 5.99* 6.41* 4.37*  CALCIUM 7.4* 8.3* 8.4* 7.9*  PHOS 3.7 5.3*  --  3.9   Liver Function Tests: Recent Labs  Lab 09/24/22 0400 09/25/22 0509 09/28/22 0340 09/29/22 0630 09/30/22 0803  AST 19  --   --   --   --   ALT 11  --   --   --   --   ALKPHOS 92  --   --   --   --   BILITOT 1.6*  --   --   --   --   PROT 4.5*  --   --   --   --   ALBUMIN 1.7*   < > <1.5* 1.5* 1.6*   < > = values in this interval not displayed.   No results for input(s): "LIPASE", "AMYLASE" in the last 168 hours. CBC: Recent Labs  Lab 09/24/22 0400 09/25/22 0509 09/25/22 2240 09/28/22 0340 09/29/22 0957 09/30/22 0803  WBC 21.0* 14.3*  --  12.8* 12.3* 13.6*  NEUTROABS  --   --   --  10.7* 10.3* 10.6*  HGB 11.6* 10.1*   < > 8.1* 8.8* 8.7*  HCT 34.2*  29.9*   < > 24.9* 26.4* 26.6*  MCV 90.0 89.5  --  94.3 94.6 96.0  PLT 179 119*  --  159 198 211   < > = values in this interval not displayed.   Blood Culture    Component Value Date/Time   SDES TISSUE 09/09/2022 1453   SPECREQUEST VESSEL PT ON VANC ANCEF 09/09/2022 1453   CULT  09/09/2022 1453    RARE STAPHYLOCOCCUS AUREUS SUSCEPTIBILITIES PERFORMED ON PREVIOUS CULTURE WITHIN THE LAST 5 DAYS. NO ANAEROBES ISOLATED Performed at [facility], [address], [place], [place] [address]    REPTSTATUS 09/14/2022 FINAL 09/09/2022 1453    Cardiac Enzymes: Recent Labs  Lab 09/28/22 0340  CKTOTAL 18*   CBG: Recent Labs  Lab 09/25/22 0335 09/25/22 0749 09/25/22 1109 09/25/22 1546 09/28/22 1617  GLUCAP 82 79 92 112* 99   Iron Studies: No  results for input(s): "IRON", "TIBC", "TRANSFERRIN", "FERRITIN" in the last 72 hours. Lab Results  Component Value Date   INR 1.2 08/12/2022   INR 1.3 (H) 06/29/2022   INR 1.3 (H) 06/28/2022   Studies/Results: No results found.  Medications:  sodium chloride 0 mL/hr at 09/22/22 0757   DAPTOmycin (CUBICIN) 600 mg in sodium chloride 0.9 % IVPB 600 mg (09/29/22 2050)   DAPTOmycin (CUBICIN) 750 mg in sodium chloride 0.9 % IVPB Stopped (09/26/22 1731)   sodium chloride      (feeding supplement) PROSource Plus  30 mL Oral TID WC   sodium chloride   Intravenous Once   acetaminophen  650 mg Oral Q6H   ascorbic acid  250 mg Oral BID   budesonide  9 mg Oral Daily   calcium acetate  1,334 mg Oral TID WC   Chlorhexidine Gluconate Cloth  6 each Topical Q0600   [START ON 10/01/2022] collagenase   Topical Daily   darbepoetin (ARANESP) injection - DIALYSIS  200 mcg Subcutaneous Q Wed-1800   feeding supplement  1 Container Oral TID BM   liver oil-zinc oxide   Topical TID   methocarbamol  500 mg Oral TID   metoprolol succinate  25 mg Oral Daily   multivitamin  1 tablet Oral QHS   nutrition supplement (JUVEN)  1 packet Oral BID BM    pantoprazole  40 mg Oral BID   polyethylene glycol  17 g Oral Daily   rosuvastatin  10 mg Oral Daily   sodium chloride flush  10-40 mL Intracatheter Q12H   sodium hypochlorite   Irrigation BID   vitamin A  10,000 Units Oral Daily    Dialysis Orders: TTS SW  4h   350/800  64.5kg  RFA AVG -> clotted/ new TDC in place Heparin none  - last OP HD 3/14, post wt 65.9kg - venofer 50mg  IV weekly - rocaltrol 0.25 mcg po tiw - phoslo 2 ac tid - no esa, last Hb 11.0  Assessment/Plan: Recurrent lower GIB: S/p colonoscopy 09/15/22, then repeat sigmoidoscopy 4/11. Several bleeding ulcers addressed.  Path came back + Crohn's - started on mesalamine by GI. Brisk re-bleed 4/18, back for emergent sigmoidoscopy with hemostatic gel applied to bleeding ulcer. Patient started bleeding again from his rectum/ anus -s/p suturing of bleeding ulcers 4/20 by Dr. Magnus Ivan Left common femoral artery pseudoaneurysm - s/p L femoral exploration with pseudoaneurysm repair 08/29/22, then iliofemoral bypass, removal of infected patch with sartorius muscle flap on 09/09/22. Ischemic changes to L 3rd-5th toes - still high risk of limb loss.   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  CVA: + stroke on MRI. Neuro signed off.  ESRD: Continue HD on TTS schedule, did require short-term CRRT 3/23- 3/26. Now on IHD. Next HD 4/25  per his usual schedule. BP/volume: Variable, 1+ edema on exam. Push UF as tolerated Anemia (ESRD + ABLA): Last transfused 1U on 4/20 (that brings total to 24U this admit). Also on Aranesp q Wed (max dose). Staying pretty stable  Secondary HPTH: CorrCa high but now at goal, Phos ok. Continue holding VDRA , continue phoslo for now. Dialysis access: AVG clotted 3/27, went for declot by VVS and then re-clotted. VVS placed R TDC on 4/1.  Albumin less than 1.5-  30 + days of hosp today -  has palliative care  seen him at all ? Would be appropriate to  talk about at least DNR  Cecille Aver  [facility] 09/30/2022,9:31 AM  LOS: 33 days

## 2022-09-30 NOTE — Progress Notes (Signed)
Contacted MD on call, Margo Aye and advised pt is bleeding bright red blood from rectum. Pt had a bowel movement, but had a significant amount of bright red blood as well. MD advised it was the end of shift and to contact the day time attending for the patient as well as rapid response, as this patient has had several issues with bleeding and may need some intervention.  RN contacted rapid response and was advised they would see the patient this morning, advised RN to contact the attending as well. Patients bp became elevated while getting his sacral wound dressing changed. Rapid advised not to treat the bp, because the pt has a history of low bp when he bleeds.  RN contacted MD on call  for day shift, Sreenath MD, additional orders were given and labs have been drawn.  Marland Kitchen

## 2022-09-30 NOTE — Progress Notes (Addendum)
Physical Therapy Treatment Patient Details Name: Albert Harris MRN: [id number] DOB: 1955/08/13 Today's Date: 09/30/2022   History of Present Illness 67 year old male  ER with abdominal pain, nausea, diaphoresis and unable to feel his Lt foot. Hypotensive with lactic acidosis in ER.  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 CRRT 3/22-3/26.  3/22 repair of distal Lt external iliac artery. On 3/30, Hgb drop from 9 to 7 with worsening abdominal pain, CT abd 3/30 shows slightly larger anterior pelvic extraperitoneal hematoma. 4/18 -  massive hemoptysis requiring 4 units PRBC, bedside flex sig with bleeding ulcer, hemostatic gel applied, ETT 4/18-19. PMHx: PAD, ESRD HD, recently hospitalized 3/16-18 for acute on chronic left lower extremity ischemia.    PT Comments    Pt received in supine, family present, pt agreeable to therapy session with encouragement with emphasis on improved body mechanics with bed mobility via log roll, seated balance, close VS monitoring and supine/sidelying/seated UE/LE exercises for strengthening. Pt internally distracted due to pain and benefits from cues for pursed-lip breathing and increased time to initiate/perform all tasks. Pt motivated to improve his strength/mobility and remains a good candidate for short term lower intensity post-acute rehab upon DC. Recommend Prevalon boot for LLE to promote neutral hip posture due to noted plantarflexion at rest, MD/RN notified. Pt may need to initially try 1-2 hours on/off schedule (or switch boot from one leg to the other) to build tolerance on LLE. Pt continues to benefit from PT services to progress toward functional mobility goals.    Recommendations for follow up therapy are one component of a multi-disciplinary discharge planning process, led by the attending physician.  Recommendations may be updated based on patient status, additional functional criteria and insurance authorization.  Follow Up  Recommendations  Can patient physically be transported by private vehicle: No    Assistance Recommended at Discharge Frequent or constant Supervision/Assistance  Patient can return home with the following Assist for transportation;Assistance with cooking/housework;Help with stairs or ramp for entrance;A lot of help with walking and/or transfers;A lot of help with bathing/dressing/bathroom   Equipment Recommendations  Other (comment) (defer; currently would need mechanical lift, hospital bed with air mattress and reclining back wheelchair with roho cushion and elevating leg rests)    Recommendations for Other Services       Precautions / Restrictions Precautions Precautions: Fall Precaution Comments: L groin wound vac; Per verbal order of Dr. Chestine Spore and Dr. Lenell Antu 4/19 - unrestricted L hip ROM, pt appropriate to move OOB and mobilize as appropriate. Restrictions Weight Bearing Restrictions: Yes LLE Weight Bearing: Weight bearing as tolerated     Mobility  Bed Mobility Overal bed mobility: Needs Assistance Bed Mobility: Rolling, Sit to Sidelying, Sidelying to Sit Rolling: Mod assist Sidelying to sit: +2 for physical assistance, Max assist     Sit to sidelying: Mod assist, +2 for physical assistance General bed mobility comments: assist for log roll to and from R EOB to prevent shearing on buttocks, maxA +2 for trunk elevation and modA +2 for trunk lowering and LE translation to/from EOB, and repositioning once returned to supine. Pt additionally rolled to L/R sides for removal of bed pad with dried blood on it, RN notified of blood.    Transfers Overall transfer level: Needs assistance                Lateral/Scoot Transfers: Total assist, +2 physical assistance General transfer comment: standing NT - sizewise bed too high off the ground, limited by  BP, fatigue and pain after sitting EOB. +2 totalA for lateral scoot, pt attempting to assist with chair push-up to deweight hips  from air mattress and PTA/OT lifting/laterally shifting pt toward [facility] to prevent skin shearing. Pt assisting but more than maxA needed for his safety.      Balance Overall balance assessment: Needs assistance Sitting-balance support: Bilateral upper extremity supported, Single extremity supported, No upper extremity supported, Feet unsupported Sitting balance-Leahy Scale: Fair Sitting balance - Comments: fair to poor, periods of supervision transitioning to min assist given fatigue and posterior truncal bias, air mattress edge pushing pt back into posterior lean, pt does better when therapists assisting to compress edge of bed for more level surface. Postural control: Posterior lean     Standing balance comment: defer for pt safety, not yet safe to attempt                            Cognition Arousal/Alertness: Awake/alert Behavior During Therapy: WFL for tasks assessed/performed Overall Cognitive Status: Within Functional Limits for tasks assessed                                 General Comments: Pt cooperative and and gave great effort t/o session. Pt anxious in anticipation of pain but agreeable.        Exercises General Exercises - Lower Extremity Ankle Circles/Pumps: AROM, PROM, Both, 5 reps, Supine (AROM on RLE, no active DF on L, pt tolerates PROM ~5 reps) Short Arc Quad: AAROM, AROM, Both, 5 reps, Seated (AA on LLE) Heel Slides: AROM, AAROM, Both, 5 reps, Supine Hip ABduction/ADduction: AAROM, Left, 10 reps, Sidelying Other Exercises: Supine LLE PROM: heel cord stretch x30 sec x2 reps Other Exercises: lateral and posterior lean to his L side propping on elbow ~30 seconds to offload bottom and promote tolerance to weight shift to midline, pt with improved midline posture after completing this. Other Exercises: sidelying LUE AROM: pushing/pulling to/away from bed side rail x5 reps x2 sets for UE strengthening Other Exercises: BUE chair push-ups x3 reps  seated EOB with emphasis on deweighting hips    General Comments General comments (skin integrity, edema, etc.): BP 107/71 (84) seated at 12:13pm no dizziness; BP 114/74 (83) seated at 12:20pm; SpO2 100% and HR 76 bpm supine at rest.      Pertinent Vitals/Pain Pain Assessment Pain Assessment: Faces Faces Pain Scale: Hurts even more Breathing: occasional labored breathing, short period of hyperventilation Negative Vocalization: occasional moan/groan, low speech, negative/disapproving quality Facial Expression: facial grimacing Body Language: tense, distressed pacing, fidgeting Consolability: distracted or reassured by voice/touch PAINAD Score: 6 Pain Location: bottom>LLE Pain Descriptors / Indicators: Grimacing, Guarding, Sore, Constant, Discomfort Pain Intervention(s): Limited activity within patient's tolerance, Monitored during session, Premedicated before session, Repositioned           PT Goals (current goals can now be found in the care plan section) Acute Rehab PT Goals Patient Stated Goal: Pt wants to get better and go home. PT Goal Formulation: With patient/family Time For Goal Achievement: 09/30/22 Progress towards PT goals: Progressing toward goals    Frequency    Min 2X/week      PT Plan Current plan remains appropriate    Co-evaluation PT/OT/SLP Co-Evaluation/Treatment: Yes Reason for Co-Treatment: Complexity of the patient's impairments (multi-system involvement);For patient/therapist safety;To address functional/ADL transfers PT goals addressed during session: Strengthening/ROM;Mobility/safety with mobility;Balance  AM-PAC PT "6 Clicks" Mobility   Outcome Measure  Help needed turning from your back to your side while in a flat bed without using bedrails?: A Lot Help needed moving from lying on your back to sitting on the side of a flat bed without using bedrails?: Total Help needed moving to and from a bed to a chair (including a wheelchair)?:  Total Help needed standing up from a chair using your arms (e.g., wheelchair or bedside chair)?: Total Help needed to walk in hospital room?: Total Help needed climbing 3-5 steps with a railing? : Total 6 Click Score: 7    End of Session   Activity Tolerance: Patient limited by fatigue;Patient limited by pain Patient left: in bed;with call bell/phone within reach;with family/visitor present;with bed alarm set (air bed with x4 rails up for pt safety) Nurse Communication: Mobility status;Precautions (he would benefit from Prevalon boot for LLE, blood on bed pad appeared dried) PT Visit Diagnosis: Other abnormalities of gait and mobility (R26.89);Muscle weakness (generalized) (M62.81);Difficulty in walking, not elsewhere classified (R26.2)     Time: 1610-9604 PT Time Calculation (min) (ACUTE ONLY): 35 min  Charges:  $Therapeutic Exercise: 8-22 mins                     Kekai Geter P., PTA Acute Rehabilitation Services Secure Chat Preferred 9a-5:30pm Office: [phone]    Dorathy Kinsman [facility] 09/30/2022, 1:53 PM

## 2022-09-30 NOTE — Care Management Important Message (Signed)
Important Message  Patient Details  Name: Albert Harris MRN: [id number] Date of Birth: 01-23-1956   Medicare Important Message Given:  Yes     Renie Ora 09/30/2022, 10:23 AM

## 2022-10-01 DIAGNOSIS — D62 Acute posthemorrhagic anemia: Secondary | ICD-10-CM | POA: Diagnosis not present

## 2022-10-01 DIAGNOSIS — A419 Sepsis, unspecified organism: Secondary | ICD-10-CM | POA: Diagnosis not present

## 2022-10-01 DIAGNOSIS — R6521 Severe sepsis with septic shock: Secondary | ICD-10-CM | POA: Diagnosis not present

## 2022-10-01 DIAGNOSIS — N186 End stage renal disease: Secondary | ICD-10-CM | POA: Diagnosis not present

## 2022-10-01 LAB — RENAL FUNCTION PANEL
Albumin: <1.5 g/dL — ABNORMAL LOW (ref 3.5–5.0)
Albumin: <1.5 g/dL — ABNORMAL LOW (ref 3.5–5.0)
Anion gap: 12 (ref 5–15)
Anion gap: 12 (ref 5–15)
BUN: 67 mg/dL — ABNORMAL HIGH (ref 8–23)
BUN: 75 mg/dL — ABNORMAL HIGH (ref 8–23)
CO2: 26 mmol/L (ref 22–32)
CO2: 26 mmol/L (ref 22–32)
Calcium: 7.9 mg/dL — ABNORMAL LOW (ref 8.9–10.3)
Calcium: 8 mg/dL — ABNORMAL LOW (ref 8.9–10.3)
Chloride: 100 mmol/L (ref 98–111)
Chloride: 100 mmol/L (ref 98–111)
Creatinine, Ser: 5.93 mg/dL — ABNORMAL HIGH (ref 0.61–1.24)
Creatinine, Ser: 6.3 mg/dL — ABNORMAL HIGH (ref 0.61–1.24)
GFR, Estimated: 10 mL/min — ABNORMAL LOW (ref >60)
GFR, Estimated: 9 mL/min — ABNORMAL LOW (ref >60)
Glucose, Bld: 106 mg/dL — ABNORMAL HIGH (ref 70–99)
Glucose, Bld: 112 mg/dL — ABNORMAL HIGH (ref 70–99)
Phosphorus: 5.2 mg/dL — ABNORMAL HIGH (ref 2.5–4.6)
Phosphorus: 5.3 mg/dL — ABNORMAL HIGH (ref 2.5–4.6)
Potassium: 4.9 mmol/L (ref 3.5–5.1)
Potassium: 5 mmol/L (ref 3.5–5.1)
Sodium: 138 mmol/L (ref 135–145)
Sodium: 138 mmol/L (ref 135–145)

## 2022-10-01 LAB — TYPE AND SCREEN
ABO/RH(D): O POS
Antibody Screen: NEGATIVE
Unit division: 0
Unit division: 0

## 2022-10-01 LAB — CBC
HCT: 21.8 % — ABNORMAL LOW (ref 39.0–52.0)
Hemoglobin: 6.9 g/dL — CL (ref 13.0–17.0)
MCH: 31.1 pg (ref 26.0–34.0)
MCHC: 31.7 g/dL (ref 30.0–36.0)
MCV: 98.2 fL (ref 80.0–100.0)
Platelets: 210 10*3/uL (ref 150–400)
RBC: 2.22 MIL/uL — ABNORMAL LOW (ref 4.22–5.81)
RDW: 19.9 % — ABNORMAL HIGH (ref 11.5–15.5)
WBC: 15.1 10*3/uL — ABNORMAL HIGH (ref 4.0–10.5)
nRBC: 0 % (ref 0.0–0.2)

## 2022-10-01 LAB — BPAM RBC
Blood Product Expiration Date: 202404262359
Blood Product Expiration Date: 202405162359
Blood Product Expiration Date: 202405202359
ISSUE DATE / TIME: 202404210933
ISSUE DATE / TIME: 202404240533
Unit Type and Rh: 5100

## 2022-10-01 LAB — HEMOGLOBIN AND HEMATOCRIT, BLOOD
HCT: 27.2 % — ABNORMAL LOW (ref 39.0–52.0)
HCT: 22.8 % — ABNORMAL LOW (ref 39.0–52.0)
Hemoglobin: 9 g/dL — ABNORMAL LOW (ref 13.0–17.0)
Hemoglobin: 7.2 g/dL — ABNORMAL LOW (ref 13.0–17.0)

## 2022-10-01 LAB — PREPARE RBC (CROSSMATCH)

## 2022-10-01 MED ORDER — ALBUMIN HUMAN 25 % IV SOLN
INTRAVENOUS | Status: AC
  Administered 2022-10-01: 25 g
  Filled 2022-10-01: qty 100

## 2022-10-01 MED ORDER — SODIUM CHLORIDE 0.9% IV SOLUTION: Freq: Once | INTRAVENOUS | Status: DC

## 2022-10-01 MED ORDER — HYDROXYZINE HCL 10 MG PO TABS
ORAL_TABLET | ORAL | Status: AC
  Filled 2022-10-01: qty 1

## 2022-10-01 MED ORDER — HEPARIN SODIUM (PORCINE) 1000 UNIT/ML IJ SOLN
INTRAMUSCULAR | Status: AC
  Administered 2022-10-01: 3200 [IU]
  Filled 2022-10-01: qty 4

## 2022-10-01 MED ORDER — HYDROXYZINE HCL 10 MG PO TABS
10.0000 mg | ORAL_TABLET | Freq: Once | ORAL | Status: AC
  Administered 2022-10-03: 10 mg via ORAL
  Filled 2022-10-01: qty 1

## 2022-10-01 NOTE — Progress Notes (Signed)
Lab called HD RN to report HB of 6.9. This RN called MD Kathrene Bongo to report critical labs. Awaiting new orders.

## 2022-10-01 NOTE — Progress Notes (Signed)
  Received patient in bed to unit.  Alert and oriented.  Informed consent signed and in chart.   TX duration:3  Patient didn't tolerate well. Blood pressure was not stable through the entire treatment  Transported back to the room  Alert, without acute distress.  Hand-off given to patient's nurse.   Access used: catheter Access issues: n/a  Total UF removed: 1.6L Medication(s) given: Albumin 25G (2x), 1 unit of blood,  Dilaudid, Atarax Post HD weight: 82.5KG   [facility]     10/01/22 1530  Vitals  Temp 97.9 F (36.6 C)  BP 119/70  MAP (mmHg) 84  ECG Heart Rate 82  Resp 14  Oxygen Therapy  SpO2 95 %  O2 Device Room Air  During Treatment Monitoring  Intra-Hemodialysis Comments Tx completed  Post Treatment  Dialyzer Clearance Lightly streaked  Duration of HD Treatment -hour(s) 3 hour(s)  Hemodialysis Intake (mL) 60 mL  Liters Processed 72  Fluid Removed (mL) 1600 mL  Tolerated HD Treatment No (Comment)  Hemodialysis Catheter Right Internal jugular Double lumen Permanent (Tunneled)  Placement Date/Time: 09/07/22 1352   Placed prior to admission: No  Serial / Lot #: [id number]  Expiration Date: 03/10/27  Time Out: Correct patient;Correct site;Correct procedure  Maximum sterile barrier precautions: Hand hygiene;Cap;Mask;Large steri...  Site Condition No complications  Blue Lumen Status Heparin locked  Red Lumen Status Heparin locked  Catheter fill solution Heparin 1000 units/ml  Catheter fill volume (Arterial) 1.6 cc  Catheter fill volume (Venous) 1.6  Dressing Type Transparent  Dressing Status Antimicrobial disc in place  Interventions Antimicrobial disc changed  Drainage Description None  Dressing Change Due 10/06/22

## 2022-10-01 NOTE — Progress Notes (Signed)
HD for significant portion of the day today. Will change VAC tomorrow.  Victorino Sparrow MD

## 2022-10-01 NOTE — Progress Notes (Signed)
Nutrition Follow-up  DOCUMENTATION CODES:   Non-severe (moderate) malnutrition in context of chronic illness  INTERVENTION:  Continue prosource TID D/c boost breeze  Continue juven BID  Continue vit A Continue vit C Continue Rena-Vite   NUTRITION DIAGNOSIS:   Moderate Malnutrition (in the context of chronic illness (ESRD)) related to poor appetite as evidenced by moderate fat depletion, severe muscle depletion.  GOAL:   Patient will meet greater than or equal to 90% of their needs -ongoing  MONITOR:   PO intake, Supplement acceptance, I & O's, Labs, Weight trends  REASON FOR ASSESSMENT:   Consult, Ventilator Enteral/tube feeding initiation and management (trickle tube feeds)  ASSESSMENT:   67 y.o. male admitted with abdominal pain, nausea, diaphoresis and unable to feel L foot d/t L femoral pseudoaneurysm. Pt was hypotensive with lactic acidosis upon admission. PMH significant for ESRD, PAD, COPD, HLD, HTN, HFrEF 35-40%.  Labs: Glu 106, BUN 67, Cr 5.93, phos 5.2, hgb 9  Meds: prosource TID, vitmain C, budesonide, phoslo, boost breeze TID, rena-vit, juven BID, protonix, crestor, NS, vitamin A Wt: admit wt 141#, CBW 176# - 35# wt gain unlikely PO: 100% meal intake x last 3 documented meals  I/O's:  -2.9 L   Patient OTF at time of visit; most likely in dialysis. Patient keeps having swings in hgb due rectal bleeding.   Reoccurring rectal bleeding  GOC ongoing (palliative consulted)     Diet Order:   Diet Order             Diet regular Room service appropriate? Yes; Fluid consistency: Thin; Fluid restriction: 1200 mL Fluid  Diet effective now                   EDUCATION NEEDS:   Education needs have been addressed  Skin:  Skin Assessment: Skin Integrity Issues: Skin Integrity Issues:: Unstageable, Other (Comment) DTI: R/L heel Stage II: n/a Unstageable: sacrum (DTI that evlolved into unstageable)- receiving hydrotherapy, 8 cms x 13 cms x 1 cm 80% black  tan devitalized tissue 20% pink moist Wound Vac: L groin Incisions: L groin (closed) Other: Buttocks and gluteal crease/upper thighs with scattered areas of partial thickness and full thickness skin loss  Last BM:  4/24  Height:   Ht Readings from Last 1 Encounters:  09/26/22 5' 7.99" (1.727 m)    Weight:   Wt Readings from Last 1 Encounters:  10/01/22 84 kg    Ideal Body Weight:  70 kg  BMI:  Body mass index is 28.16 kg/m.  Estimated Nutritional Needs:   Kcal:  1900-2100  Protein:  110-130 g  Fluid:  1L + UOP  Leodis Rains, RDN, LDN  Clinical Nutrition

## 2022-10-01 NOTE — Progress Notes (Signed)
[facility] Progress Note   Subjective:    Had more bleeding per rectum-  got transfused for hgb 7.8-  is 9 this AM-  due for dialysis later today   Objective Vitals:   09/30/22 2337 10/01/22 0500 10/01/22 0512 10/01/22 0604  BP: 122/67  90/60 (!) 124/56  Pulse: 77  79 78  Resp: Temp: 98 F (36.7 C)  98.1 F (36.7 C)   TempSrc: Oral  Oral   SpO2: 100%  99% 100%  Weight:  80 kg    Height:       Physical Exam General: Awake, alert,  On RA, NAD Heart: RRR; no murmur Lungs: CTA anteriorly Abdomen: soft Extremities: dep edema; L foot with ischemic distal 3rd-5th toes Dialysis Access: [facility]  Filed Weights   09/27/22 0847 09/29/22 1529 10/01/22 0500  Weight: 72.5 kg 80.5 kg 80 kg    Intake/Output Summary (Last 24 hours) at 10/01/2022 0839 Last data filed at 10/01/2022 0600 Gross per 24 hour  Intake 820.33 ml  Output 2 ml  Net 818.33 ml    Additional Objective Labs: Basic Metabolic Panel: Recent Labs  Lab 09/29/22 0630 09/29/22 0957 09/30/22 0803 10/01/22 0533  NA 138 138 136 138  K 4.6 4.6 4.1 4.9  CL 101 99 97* 100  CO2 GLUCOSE 75 93 99 106*  BUN 57* 62* 46* 67*  CREATININE 5.99* 6.41* 4.37* 5.93*  CALCIUM 8.3* 8.4* 7.9* 7.9*  PHOS 5.3*  --  3.9 5.2*   Liver Function Tests: Recent Labs  Lab 09/29/22 0630 09/30/22 0803 10/01/22 0533  ALBUMIN 1.5* 1.6* <1.5*   No results for input(s): "LIPASE", "AMYLASE" in the last 168 hours. CBC: Recent Labs  Lab 09/25/22 0509 09/25/22 2240 09/28/22 0340 09/29/22 0957 09/30/22 0803 09/30/22 1331 09/30/22 2240  WBC 14.3*  --  12.8* 12.3* 13.6*  --   --   NEUTROABS  --   --  10.7* 10.3* 10.6*  --   --   HGB 10.1*   < > 8.1* 8.8* 8.7* 7.8* 9.0*  HCT 29.9*   < > 24.9* 26.4* 26.6* 25.2* 27.2*  MCV 89.5  --  94.3 94.6 96.0  --   --   PLT 119*  --  159 198 211  --   --    < > = values in this interval not displayed.   Blood Culture    Component Value Date/Time   SDES  TISSUE 09/09/2022 1453   SPECREQUEST VESSEL PT ON VANC ANCEF 09/09/2022 1453   CULT  09/09/2022 1453    RARE STAPHYLOCOCCUS AUREUS SUSCEPTIBILITIES PERFORMED ON PREVIOUS CULTURE WITHIN THE LAST 5 DAYS. NO ANAEROBES ISOLATED Performed at [facility], [address], [place], [place] [address]    REPTSTATUS 09/14/2022 FINAL 09/09/2022 1453    Cardiac Enzymes: Recent Labs  Lab 09/28/22 0340  CKTOTAL 18*   CBG: Recent Labs  Lab 09/25/22 0335 09/25/22 0749 09/25/22 1109 09/25/22 1546 09/28/22 1617  GLUCAP 82 79 92 112* 99   Iron Studies: No results for input(s): "IRON", "TIBC", "TRANSFERRIN", "FERRITIN" in the last 72 hours. Lab Results  Component Value Date   INR 1.2 08/12/2022   INR 1.3 (H) 06/29/2022   INR 1.3 (H) 06/28/2022   Studies/Results: No results found.  Medications:  sodium chloride 0 mL/hr at 09/22/22 0757   DAPTOmycin (CUBICIN) 600 mg in sodium chloride 0.9 % IVPB Stopped (09/29/22 2120)   DAPTOmycin (CUBICIN)  750 mg in sodium chloride 0.9 % IVPB Stopped (09/26/22 1731)   sodium chloride      (feeding supplement) PROSource Plus  30 mL Oral TID WC   sodium chloride   Intravenous Once   acetaminophen  650 mg Oral Q6H   ascorbic acid  250 mg Oral BID   budesonide  9 mg Oral Daily   calcium acetate  1,334 mg Oral TID WC   Chlorhexidine Gluconate Cloth  6 each Topical Q0600   Chlorhexidine Gluconate Cloth  6 each Topical Q0600   collagenase   Topical Daily   darbepoetin (ARANESP) injection - DIALYSIS  200 mcg Subcutaneous Q Wed-1800   feeding supplement  1 Container Oral TID BM   liver oil-zinc oxide   Topical TID   methocarbamol  500 mg Oral TID   metoprolol succinate  25 mg Oral Daily   multivitamin  1 tablet Oral QHS   nutrition supplement (JUVEN)  1 packet Oral BID BM   pantoprazole  40 mg Oral BID   polyethylene glycol  17 g Oral Daily   rosuvastatin  10 mg Oral Daily   sodium chloride flush  10-40 mL Intracatheter Q12H   sodium  hypochlorite   Irrigation BID   vitamin A  10,000 Units Oral Daily    Dialysis Orders: TTS SW  4h   350/800  64.5kg  RFA AVG -> clotted/ new TDC in place Heparin none  - last OP HD 3/14, post wt 65.9kg - venofer  IV weekly - rocaltrol 0.25 mcg po tiw - phoslo 2 ac tid - no esa, last Hb 11.0  Assessment/Plan: Recurrent lower GIB: S/p colonoscopy 09/15/22, then repeat sigmoidoscopy 4/11. Several bleeding ulcers addressed.  Path came back + Crohn's - started on mesalamine by GI. Brisk re-bleed 4/18, back for emergent sigmoidoscopy with hemostatic gel applied to bleeding ulcer. Patient started bleeding again from his rectum/ anus -s/p suturing of bleeding ulcers 4/20 by Dr. Magnus Ivan-  this continues to be a problem Left common femoral artery pseudoaneurysm - s/p L femoral exploration with pseudoaneurysm repair 08/29/22, then iliofemoral bypass, removal of infected patch with sartorius muscle flap on 09/09/22. Ischemic changes to L 3rd-5th toes - still high risk of limb loss.   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  CVA: + stroke on MRI. Neuro signed off.  ESRD: Continue HD on TTS schedule, did require short-term CRRT 3/23- 3/26. Now on IHD. Next HD 4/25  per his usual schedule. BP/volume: Variable, 1+ edema on exam. Push UF as tolerated Anemia (ESRD + ABLA): Last transfused 1U on 4/25 (that brings total to 25U this admit). Also on Aranesp q Wed (max dose). Staying pretty stable  Secondary HPTH: CorrCa high but now at goal, Phos ok. Continue holding VDRA , continue phoslo for now. Dialysis access: AVG clotted 3/27, went for declot by VVS and then re-clotted. VVS placed R TDC on 4/1.  Albumin less than 1.5-  30 + days of hosp today -  has palliative care seen him at all ? Would be appropriate to talk about at least DNR  Cecille Aver  [facility] 10/01/2022,8:39 AM  LOS: 34 days

## 2022-10-01 NOTE — Progress Notes (Signed)
Progress Note    Albert Harris   ZOX:[id number]  DOB: 1955/12/22  DOA: 08/28/2022     32 PCP: [name], NP  Initial CC: abd pain, N/V  Hospital Course: 67 year old male with history of ESRD on HD TTS, PVD,COPD, essential hypertension, chronic systolic congestive heart failure with ejection fraction 35 to 40%, presented with abdominal pain nausea and diaphoresis. Patient was also hypotensive with a lactic acidosis and was admitted to ICU,with a pulseless left lower extremity was found to have femoral pseudoaneurysm.Culture came back with MRSA bacteremia-TEE neg for vegetations.   Events: 08/29/22: -Admitted to ICU-on vasopressors for septic shock -s/p OR with Dr. Karin Lieu for left femoral exploration with pseudoaneurysm repair,hematoma evacuation>showed dehiscence at proximal aspect of bovine pericardial patch at the suture line.  -Emergent CRRT for severe hyperkalemia  09/08/22 Transferred to [facility] service  09/09/22 CTA for bleeding on left groin: showed Thrombosis of LEFT CFA pseudoaneurysm with new 3.5 cm filling defect, consistent with arterial thrombus, extending from the PSA neck and into the distal CFA. This is at risk for distal embolization and potential acute LEFT lower extremity ischemia.New 8 mm distal LEFT EIA pseudoaneurysm, medial to the stent distal landing zone. Mild interval enlargement of retroperitoneal hematoma, in a craniocaudal dimension measuring 12.2 cm (previously 11.2 cm).No CTA evidence of active extravasation. Patent RIGHT femoral-to-popliteal graft and bilateral iliac stents.Severe burden of splanchnic arterial atherosclerosis, at risk for chronic mesenteric ischemia. 09/09/22: urgent OR:S/P left common iliac to profunda femoris bypass with ipsilateral nonreversed greater saphenous vein in anatomic tunnel, left common iliac artery angioplasty and stenting, left greater saphenous vein harvest, left sartorius muscle of left groin, left lower extremity angiography  with first-order cannulation Transferred to 2H post op 09/12/22 am: 2 BRBPR> s/p Flex sig>showed 5 rectal ulcers, not actively bleeding- stool brown.advised to avoid constipation, added Miralax BID 09/13/22 AM: again 2 large bloody BM ~ 500cc> got 1 unit PRBC  Interval History:  No events overnight.  Family present bedside this morning.  Had a long discussion regarding multiple ongoing issues being addressed and that some of medical care may start to become futile if continuing to remain transfusion dependent and ongoing bleeding.  Assessment and Plan:  Acute blood loss anemia from bleeding anal/rectal ulcers possibly from his Crohn's disease Patient underwent flex sigmoidoscopy on 4/6 was found to have 5 nonbleeding ulcers.  He also underwent colonoscopy which showed multiple rectal ulcers with visible bleeding underwent hemostasis. Patient developed massive rectal bleeding again on 4/18. Patient is not a candidate for IR intervention due to severe peripheral vascular disease and anatomy General surgery on board, was taken to the OR  on 4/20 and underwent S/p EUA and over sewing of ulcers and packing anal canal with gelfoam by Dr. Magnus Ivan. Restarted on Budesonide on 4/19 and continue with mesalamine bid.  4/24: had more bright red blood per rectum last night.  Hemoglobin this morning 8.7.  Relatively stable. Plan: Transfuse as needed for further drops Per last surgical notes there may be no options for intervention should the patient have any hemodynamically significant bleeding.  See surgical note from Dr. Magnus Ivan 4/21.  Poor prognosis.  Strongly recommend DNR status.  Patient remains full code at this time. - last seen by palliative on 4/20, will re-engage; discussed poor prognosis with patient again this morning    PVD / left common femoral artery pseudoaneurysm S/p left groin hematoma evacuation, primary repair of the distal external iliac artery. Further complicated on 4/3 by left  groin  bleeding with new pseudoaneurysm and arterial thrombus. S/p iliofemoral bypass and removal of infected patch with sartorius muscle flap. Further management as per vascular surgery. Pain control with oxycodone 5 to 10 mg every 4 hours prb and dilaudid 1 mg every 4 hours prn.  Continue with wound VAC    MRSA bacteremia secondary to postop wound infection with MRSA and Enterococcus faecium. Infectious disease note from 4/8 recommending daptomycin for 6 weeks followed by indefinite oral suppression of antibiotics. Monitor CPK weekly TEE is negative for vegetations. Patient will need outpatient follow-up with infectious disease to determine oral antibiotics. Last dose of IV antibiotics on 10/23/22    ESRD on HD Nephrology on board and dialysis via right IJ tunneled catheter. Further HD sessions as per nephrology.    Chronic systolic heart failure Last echocardiogram from January 2024 showed left ventricular ejection fraction of 35 to 40% Pulmonary hypertension Cannot restart antiplatelet agents at this time Fluid management as per dialysis.   Acute metabolic encephalopathy from uremia, acidosis and acute 4 mm right subinsular stroke Resolved. Pt is alert , following commands.  If/once bleeding is controlled, will need antiplatelet therapy for acute infarct.    Retroperitoneal hemorrhage Appears to be Stable    Moderate malnutrition Nutritionist on board, supplementations to be ordered   Hypertension:  On metoprolol. Suspect a component of pain.  Continue with hydralazine as needed   COPD:  No wheezing heard.  On RA.        Old records reviewed in assessment of this patient   DVT prophylaxis:  SCD's Start: 09/09/22 1841   Code Status:   Code Status: Full Code  Mobility Assessment (last 72 hours)     Mobility Assessment     Row Name 10/01/22 0830 10/01/22 0444 09/30/22 2339 09/30/22 1935 09/30/22 1400   Does patient have an order for bedrest or is patient medically  unstable No - Continue assessment Yes- Bedfast (Level 1) - Complete Yes- Bedfast (Level 1) - Complete Yes- Bedfast (Level 1) - Complete --   What is the highest level of mobility based on the progressive mobility assessment? Level 2 (Chairfast) - Balance while sitting on edge of bed and cannot stand Level 1 (Bedfast) - Unable to balance while sitting on edge of bed Level 1 (Bedfast) - Unable to balance while sitting on edge of bed Level 1 (Bedfast) - Unable to balance while sitting on edge of bed Level 1 (Bedfast) - Unable to balance while sitting on edge of bed   Is the above level different from baseline mobility prior to current illness? Yes - Recommend PT order Yes - Recommend PT order Yes - Recommend PT order Yes - Recommend PT order --    Row Name 09/30/22 1300 09/29/22 2000 09/29/22 0938 09/28/22 1950     Does patient have an order for bedrest or is patient medically unstable -- No - Continue assessment No - Continue assessment No - Continue assessment    What is the highest level of mobility based on the progressive mobility assessment? Level 1 (Bedfast) - Unable to balance while sitting on edge of bed Level 1 (Bedfast) - Unable to balance while sitting on edge of bed Level 1 (Bedfast) - Unable to balance while sitting on edge of bed Level 1 (Bedfast) - Unable to balance while sitting on edge of bed    Is the above level different from baseline mobility prior to current illness? -- Yes - Recommend PT order Yes - Recommend  PT order Yes - Recommend PT order             Barriers to discharge:  Disposition Plan:  Home Status is: Inpt  Objective: Blood pressure 138/64, pulse (!) 104, temperature 97.9 F (36.6 C), resp. rate 16, height 5' 7.99" (1.727 m), weight 82.5 kg, SpO2 95 %.  Examination:  Physical Exam Constitutional:      General: He is not in acute distress.    Appearance: Normal appearance. He is well-developed.  HENT:     Head: Normocephalic and atraumatic.      Mouth/Throat:     Mouth: Mucous membranes are moist.  Eyes:     Extraocular Movements: Extraocular movements intact.  Cardiovascular:     Rate and Rhythm: Normal rate and regular rhythm.     Heart sounds: Normal heart sounds.  Pulmonary:     Effort: Pulmonary effort is normal. No respiratory distress.     Breath sounds: Normal breath sounds. No wheezing.  Abdominal:     General: Bowel sounds are normal. There is no distension.     Palpations: Abdomen is soft.     Tenderness: There is no abdominal tenderness.  Genitourinary:    Comments: Left groin WV in place Musculoskeletal:        General: Normal range of motion.     Cervical back: Normal range of motion and neck supple.  Skin:    General: Skin is warm and dry.  Neurological:     General: No focal deficit present.     Mental Status: He is alert.  Psychiatric:        Mood and Affect: Mood normal.        Behavior: Behavior normal.      Consultants:  Nephrology Palliative care General surgery Vascular surgery  Procedures:    Data Reviewed: Results for orders placed or performed during the hospital encounter of 08/28/22 (from the past 24 hour(s))  Type and screen [facility]     Status: None (Preliminary result)   Collection Time: 09/30/22  4:00 PM  Result Value Ref Range   ABO/RH(D) O POS    Antibody Screen NEG    Sample Expiration 10/03/2022,2359    Unit Number [id number]    Blood Component Type RED CELLS,LR    Unit division 00    Status of Unit ISSUED,FINAL    Transfusion Status OK TO TRANSFUSE    Crossmatch Result Compatible    Unit Number [id number]    Blood Component Type RBC LR PHER2    Unit division 00    Status of Unit ISSUED    Transfusion Status OK TO TRANSFUSE    Crossmatch Result      Compatible Performed at [facility], [address], [place], [place] [address]   Hemoglobin and hematocrit, blood     Status: Abnormal   Collection Time: 09/30/22 10:40 PM  Result  Value Ref Range   Hemoglobin 9.0 (L) 13.0 - 17.0 g/dL   HCT 96.2 (L) 95.2 - 84.1 %  Renal function panel (daily at 0500)     Status: Abnormal   Collection Time: 10/01/22  5:33 AM  Result Value Ref Range   Sodium 138 135 - 145 mmol/L   Potassium 4.9 3.5 - 5.1 mmol/L   Chloride 100 98 - 111 mmol/L   CO2 26 22 - 32 mmol/L   Glucose, Bld 106 (H) 70 - 99 mg/dL   BUN 67 (H) 8 - 23 mg/dL  Creatinine, Ser 5.93 (H) 0.61 - 1.24 mg/dL   Calcium 7.9 (L) 8.9 - 10.3 mg/dL   Phosphorus 5.2 (H) 2.5 - 4.6 mg/dL   Albumin <4.0 (L) 3.5 - 5.0 g/dL   GFR, Estimated 10 (L) >60 mL/min   Anion gap 12 5 - 15  Hemoglobin and hematocrit, blood     Status: Abnormal   Collection Time: 10/01/22  9:49 AM  Result Value Ref Range   Hemoglobin 7.2 (L) 13.0 - 17.0 g/dL   HCT 98.1 (L) 19.1 - 47.8 %  Renal function panel     Status: Abnormal   Collection Time: 10/01/22 12:24 PM  Result Value Ref Range   Sodium 138 135 - 145 mmol/L   Potassium 5.0 3.5 - 5.1 mmol/L   Chloride 100 98 - 111 mmol/L   CO2 26 22 - 32 mmol/L   Glucose, Bld 112 (H) 70 - 99 mg/dL   BUN 75 (H) 8 - 23 mg/dL   Creatinine, Ser 2.95 (H) 0.61 - 1.24 mg/dL   Calcium 8.0 (L) 8.9 - 10.3 mg/dL   Phosphorus 5.3 (H) 2.5 - 4.6 mg/dL   Albumin <6.2 (L) 3.5 - 5.0 g/dL   GFR, Estimated 9 (L) >60 mL/min   Anion gap 12 5 - 15  CBC     Status: Abnormal   Collection Time: 10/01/22 12:24 PM  Result Value Ref Range   WBC 15.1 (H) 4.0 - 10.5 K/uL   RBC 2.22 (L) 4.22 - 5.81 MIL/uL   Hemoglobin 6.9 (LL) 13.0 - 17.0 g/dL   HCT 13.0 (L) 86.5 - 78.4 %   MCV 98.2 80.0 - 100.0 fL   MCH 31.1 26.0 - 34.0 pg   MCHC 31.7 30.0 - 36.0 g/dL   RDW 69.6 (H) 29.5 - 28.4 %   Platelets 210 150 - 400 K/uL   nRBC 0.0 0.0 - 0.2 %  Prepare RBC (crossmatch)     Status: None   Collection Time: 10/01/22  1:45 PM  Result Value Ref Range   Order Confirmation      ORDER PROCESSED BY BLOOD BANK Performed at [facility], [address] [place], [place] [address]      I have reviewed pertinent nursing notes, vitals, labs, and images as necessary. I have ordered labwork to follow up on as indicated.  I have reviewed the last notes from staff over past 24 hours. I have discussed patient's care plan and test results with nursing staff, CM/SW, and other staff as appropriate.  Time spent: Greater than 50% of the 55 minute visit was spent in counseling/coordination of care for the patient as laid out in the A&P.   LOS: 34 days   Lewie Chamber, MD [facility] 10/01/2022, 3:57 PM

## 2022-10-01 NOTE — Progress Notes (Signed)
Pt had large amount of bright red blood per rectum x 1. Hemodynamics has remained stable. At 22:40, Hb 9.0 after one unit of PRBC transfusion. We will follow Hb/Hct q 6 hrs.   Severe left foot pain. Pt requested Oxycodone and Dilaudid combined together prior reposition to change sacral dressing or to clean up from rectal bleeding. We will continue to monitor.  Filiberto Pinks, RN

## 2022-10-01 NOTE — TOC Progression Note (Addendum)
Transition of Care [facility]) - Initial/Assessment Note    Patient Details  Name: Albert Harris MRN: [id number] Date of Birth: [birth year]  [facility] CM/SW Contact:    Ralene Bathe, LCSWA Phone Number: 10/01/2022, 10:15 AM  Clinical Narrative:                 LCSW contacted SNF [facility]) and verified that the facility has received would vac order.  TOC following  Expected Discharge Plan: Skilled Nursing Facility Barriers to Discharge: Continued Medical Work up   Patient Goals and CMS Choice   CMS Medicare.gov Compare Post Acute Care list provided to:: Patient Represenative (must comment) (Daughter-[name])        Expected Discharge Plan and Services In-house Referral: Clinical Social Work   Post Acute Care Choice: Skilled Nursing Facility Living arrangements for the past 2 months: Single Family Home                                      Prior Living Arrangements/Services Living arrangements for the past 2 months: Single Family Home Lives with:: Spouse Patient language and need for interpreter reviewed:: Yes        Need for Family Participation in Patient Care: Yes (Comment) Care giver support system in place?: Yes (comment) Current home services: DME (rolling walker) Criminal Activity/Legal Involvement Pertinent to Current Situation/Hospitalization: No - Comment as needed  Activities of Daily Living Home Assistive Devices/Equipment: Eyeglasses, Environmental consultant (specify type), Shower chair with back, Wheelchair ADL Screening (condition at time of admission) Patient's cognitive ability adequate to safely complete daily activities?: Yes Is the patient deaf or have difficulty hearing?: No Does the patient have difficulty seeing, even when wearing glasses/contacts?: No Does the patient have difficulty concentrating, remembering, or making decisions?: No Patient able to express need for assistance with ADLs?: Yes Does the patient have difficulty dressing or  bathing?: No Independently performs ADLs?: Yes (appropriate for developmental age) Does the patient have difficulty walking or climbing stairs?: Yes Weakness of Legs: Both Weakness of Arms/Hands: None  Permission Sought/Granted Permission sought to share information with : Case Manager, Family Supports Permission granted to share information with : Yes, Verbal Permission Granted  Share Information with NAME: August Saucer  Permission granted to share info w AGENCY: SNF, IP rehab, Home Health  Permission granted to share info w Relationship: daughter  Permission granted to share info w Contact Information: [phone]  Emotional Assessment Appearance:: Appears stated age Attitude/Demeanor/Rapport: Unable to Assess Affect (typically observed): Unable to Assess Orientation: :  (not recorded)      Admission diagnosis:  Lactic acidosis [E87.20] Patient Active Problem List   Diagnosis Date Noted   MRSA bacteremia 09/25/2022   Lower GI bleeding 09/25/2022   Hematochezia 09/24/2022   Acute blood loss anemia 09/24/2022   Rectal arterial hemorrhage 09/17/2022   Rectal ulcer 09/17/2022   Colonic ulcer 09/15/2022   MRSA (methicillin resistant Staphylococcus aureus) septicemia 09/08/2022   Pressure injury of skin 09/08/2022   Septic shock 09/03/2022   Pseudoaneurysm of femoral artery 09/03/2022   Decreased functional mobility 09/03/2022   Malnutrition of moderate degree 09/01/2022   Lactic acidosis 08/28/2022   ESRD (end stage renal disease) on dialysis 08/28/2022   Acute lower limb ischemia 08/21/2022   Leukocytosis 08/21/2022   Nucleic acid assay by PCR positive for norovirus 08/13/2022   Nausea vomiting and diarrhea 08/13/2022   Colitis 08/12/2022  Retroperitoneal hematoma 08/12/2022   C. difficile colitis 07/22/2022   Alcohol use disorder 06/30/2022   Clostridium difficile diarrhea 06/30/2022   Other ascites 06/30/2022   Acute systolic congestive heart failure 06/30/2022    ESRD on dialysis 06/28/2022   Spinal stenosis of cervical region 06/28/2022   Diarrhea 06/28/2022   Cirrhosis of liver 06/28/2022   Lymphedema 06/28/2022   HFrEF (heart failure with reduced ejection fraction) 06/28/2022   PAD (peripheral artery disease) 09/05/2021   Metabolic encephalopathy 05/09/2021   Thrombocytopenia 05/05/2021   Goals of care, counseling/discussion    Hypertension    COPD (chronic obstructive pulmonary disease)    Lower GI bleed    Claudication in peripheral vascular disease 09/12/2019   Carotid stenosis 09/12/2019   PFO (patent foramen ovale) 08/10/2019   Noncompliance 08/10/2019   Hx of transient ischemic attack ([name]) 08/10/2019   Chronic diastolic heart failure    Elevated troponin    Hypertensive emergency    Acute pulmonary edema 07/18/2019   ARF (acute renal failure) 07/18/2019   Macrocytic anemia 07/18/2019   ESRD (end stage renal disease) 11/09/2017   PCP:  [name], NP Pharmacy:   [facility] [phone] - [place], [place] - [address] AT [facility] [address] [place] [address] Phone: [phone] Fax: [phone]  [facility] [address] [place] [address] Phone: [phone] Fax: [phone]     Social Determinants of Health (SDOH) Social History: SDOH Screenings   Food Insecurity: No Food Insecurity (09/29/2022)  Recent Concern: Food Insecurity - Food Insecurity Present (08/22/2022)  Housing: Low Risk  (09/29/2022)  Transportation Needs: No Transportation Needs (09/29/2022)  Recent Concern: Transportation Needs - Unmet Transportation Needs (08/22/2022)  Utilities: Not At Risk (09/29/2022)  Alcohol Screen: Low Risk  (09/12/2019)  Depression (PHQ2-9): High Risk (05/07/2022)  Financial Resource Strain: Low Risk  (09/12/2019)  Stress: No Stress Concern Present (09/12/2019)  Tobacco Use: Medium Risk (09/29/2022)   SDOH Interventions:      Readmission Risk Interventions    08/24/2022   11:27 AM 08/14/2022   10:07 AM 07/24/2022   11:52 AM  Readmission Risk Prevention Plan  Transportation Screening Complete Complete Complete  Medication Review (RN Care Manager) Referral to Pharmacy Referral to Pharmacy Complete  PCP or Specialist appointment within 3-5 days of discharge  Complete Complete  HRI or Home Care Consult Complete Patient refused Complete  SW Recovery Care/Counseling Consult Complete Complete Patient refused  Palliative Care Screening Not Applicable Not Applicable Not Applicable  Skilled Nursing Facility Not Applicable Not Applicable Not Applicable

## 2022-10-02 DIAGNOSIS — Z515 Encounter for palliative care: Secondary | ICD-10-CM | POA: Diagnosis not present

## 2022-10-02 DIAGNOSIS — D62 Acute posthemorrhagic anemia: Secondary | ICD-10-CM | POA: Diagnosis not present

## 2022-10-02 DIAGNOSIS — R6521 Severe sepsis with septic shock: Secondary | ICD-10-CM | POA: Diagnosis not present

## 2022-10-02 DIAGNOSIS — Z7189 Other specified counseling: Secondary | ICD-10-CM | POA: Diagnosis not present

## 2022-10-02 DIAGNOSIS — N186 End stage renal disease: Secondary | ICD-10-CM | POA: Diagnosis not present

## 2022-10-02 DIAGNOSIS — A419 Sepsis, unspecified organism: Secondary | ICD-10-CM | POA: Diagnosis not present

## 2022-10-02 DIAGNOSIS — M79605 Pain in left leg: Secondary | ICD-10-CM | POA: Diagnosis not present

## 2022-10-02 LAB — BPAM RBC
Blood Product Expiration Date: 202405282359
ISSUE DATE / TIME: 202404251407

## 2022-10-02 LAB — RENAL FUNCTION PANEL
Albumin: 2 g/dL — ABNORMAL LOW (ref 3.5–5.0)
Anion gap: 11 (ref 5–15)
BUN: 46 mg/dL — ABNORMAL HIGH (ref 8–23)
CO2: 27 mmol/L (ref 22–32)
Calcium: 8.1 mg/dL — ABNORMAL LOW (ref 8.9–10.3)
Chloride: 98 mmol/L (ref 98–111)
Creatinine, Ser: 4.3 mg/dL — ABNORMAL HIGH (ref 0.61–1.24)
GFR, Estimated: 14 mL/min — ABNORMAL LOW (ref >60)
Glucose, Bld: 91 mg/dL (ref 70–99)
Phosphorus: 3.7 mg/dL (ref 2.5–4.6)
Potassium: 4.2 mmol/L (ref 3.5–5.1)
Sodium: 136 mmol/L (ref 135–145)

## 2022-10-02 LAB — HEMOGLOBIN AND HEMATOCRIT, BLOOD
HCT: 22.2 % — ABNORMAL LOW (ref 39.0–52.0)
HCT: 23 % — ABNORMAL LOW (ref 39.0–52.0)
Hemoglobin: 7.1 g/dL — ABNORMAL LOW (ref 13.0–17.0)
Hemoglobin: 7.3 g/dL — ABNORMAL LOW (ref 13.0–17.0)

## 2022-10-02 LAB — TYPE AND SCREEN: Unit division: 0

## 2022-10-02 MED ORDER — CHLORHEXIDINE GLUCONATE CLOTH 2 % EX PADS
6.0000 | MEDICATED_PAD | Freq: Every day | CUTANEOUS | Status: DC
  Administered 2022-10-03: 6 via TOPICAL

## 2022-10-02 NOTE — Progress Notes (Signed)
Occupational Therapy Treatment Patient Details Name: Albert Harris MRN: [id number] DOB: 1955/07/11 Today's Date: 10/02/2022   History of present illness 67 year old male  ER with abdominal pain, nausea, diaphoresis and unable to feel his Lt foot. Hypotensive with lactic acidosis in ER.  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 CRRT 3/22-3/26.  3/22 repair of distal Lt external iliac artery. On 3/30, Hgb drop from 9 to 7 with worsening abdominal pain, CT abd 3/30 shows slightly larger anterior pelvic extraperitoneal hematoma. 4/18 -  massive hemoptysis requiring 4 units PRBC, bedside flex sig with bleeding ulcer, hemostatic gel applied, ETT 4/18-19. PMHx: PAD, ESRD HD, recently hospitalized 3/16-18 for acute on chronic left lower extremity ischemia.   OT comments  Patient seen with PT to address bed mobility, sitting balance/tolerance, and sit to stands. Patient educated on log rolling and max assist +2 to get to EOB. Patient with posterior leaning and min assist for sitting balance and progressed to min guard but fatigued following 15 minutes and required min assist. Patient performed 2 stands from EOB into stedy with max assist x2 and limited standing tolerance.Patient left in bed in side lying position at end of session. Patient will benefit from continued inpatient follow up therapy, <3 hours/day to address UE strengthening, bed mobility, and transfers. Acute OT to continue to follow.    Recommendations for follow up therapy are one component of a multi-disciplinary discharge planning process, led by the attending physician.  Recommendations may be updated based on patient status, additional functional criteria and insurance authorization.    Assistance Recommended at Discharge Intermittent Supervision/Assistance  Patient can return home with the following  A lot of help with walking and/or transfers;A lot of help with bathing/dressing/bathroom;Assistance with  cooking/housework;Assist for transportation;Help with stairs or ramp for entrance   Equipment Recommendations  Other (comment) (defer to next venue of care)    Recommendations for Other Services      Precautions / Restrictions Precautions Precautions: Fall Precaution Comments: L groin wound vac; Per verbal order of Dr. Chestine Spore and Dr. Lenell Antu 4/19 - unrestricted L hip ROM, pt appropriate to move OOB and mobilize as appropriate. Restrictions Weight Bearing Restrictions: Yes LLE Weight Bearing: Weight bearing as tolerated       Mobility Bed Mobility Overal bed mobility: Needs Assistance Bed Mobility: Rolling, Sit to Sidelying, Sidelying to Sit Rolling: Mod assist Sidelying to sit: +2 for physical assistance, Max assist     Sit to sidelying: Mod assist, +2 for physical assistance General bed mobility comments: education on log rolling technique with assistance for trunk and BLEs, patient able to assist more with sit to sidelying requiring more assistance for BLEs than trunk    Transfers Overall transfer level: Needs assistance Equipment used: Ambulation equipment used Transfers: Sit to/from Stand Sit to Stand: Max assist, +2 physical assistance           General transfer comment: 2 sit to stands performed from EOB to Turner with max assist x2 and patient unable to come to complete stand     Balance Overall balance assessment: Needs assistance Sitting-balance support: Bilateral upper extremity supported, Single extremity supported, No upper extremity supported, Feet unsupported Sitting balance-Leahy Scale: Fair Sitting balance - Comments: min assist for balance initially and progressed to min guard. Min assist again at end of session due to fatigue Postural control: Posterior lean Standing balance support: Bilateral upper extremity supported, During functional activity, Reliant on assistive device for balance Standing balance-Leahy Scale: Poor  Standing balance comment:  reliant on Stedy and therapist for support with Stedy blocking knees                           ADL either performed or assessed with clinical judgement   ADL Overall ADL's : Needs assistance/impaired     Grooming: Wash/dry face;Min guard;Sitting Grooming Details (indicate cue type and reason): EOB                               General ADL Comments: focused on EOB sitting balance and sit to stands into stedy    Extremity/Trunk Assessment              Vision       Perception     Praxis      Cognition Arousal/Alertness: Awake/alert Behavior During Therapy: WFL for tasks assessed/performed Overall Cognitive Status: Within Functional Limits for tasks assessed                                 General Comments: able to recall therapist from previous visit, unaware he received pain meds, cooperative and motivated towards mobility        Exercises      Shoulder Instructions       General Comments BP 168/89 HR 80 supine and BP 149/71 seated    Pertinent Vitals/ Pain       Pain Assessment Pain Assessment: Faces Faces Pain Scale: Hurts even more Pain Location: bottom, LLE Pain Descriptors / Indicators: Grimacing, Guarding, Sore, Constant, Discomfort Pain Intervention(s): Limited activity within patient's tolerance, Monitored during session, Repositioned, Premedicated before session  Home Living                                          Prior Functioning/Environment              Frequency  Min 2X/week        Progress Toward Goals  OT Goals(current goals can now be found in the care plan section)  Progress towards OT goals: Progressing toward goals  Acute Rehab OT Goals Patient Stated Goal: get stronger OT Goal Formulation: With patient/family Time For Goal Achievement: 10/12/22 Potential to Achieve Goals: Good ADL Goals Pt Will Perform Grooming: with set-up;sitting Pt Will Perform Upper Body  Dressing: with min assist;sitting Pt Will Perform Lower Body Dressing: with max assist;sit to/from stand Pt Will Transfer to Toilet: squat pivot transfer;bedside commode;with max assist Additional ADL Goal #1: Pt will identify 3+ fall prevention strategies for use in the home setting. Additional ADL Goal #2: Pt will identify 3+ fall prevention strategies for use in the home setting.  Plan Discharge plan remains appropriate    Co-evaluation    PT/OT/SLP Co-Evaluation/Treatment: Yes Reason for Co-Treatment: Complexity of the patient's impairments (multi-system involvement);For patient/therapist safety;To address functional/ADL transfers   OT goals addressed during session: ADL's and self-care;Strengthening/ROM      AM-PAC OT "6 Clicks" Daily Activity     Outcome Measure   Help from another person eating meals?: A Little Help from another person taking care of personal grooming?: A Little Help from another person toileting, which includes using toliet, bedpan, or urinal?: Total Help from another person bathing (including washing, rinsing, drying)?: A  Lot Help from another person to put on and taking off regular upper body clothing?: A Lot Help from another person to put on and taking off regular lower body clothing?: Total 6 Click Score: 12    End of Session Equipment Utilized During Treatment: Gait belt;Other (comment) Antony Salmon)  OT Visit Diagnosis: Unsteadiness on feet (R26.81);Muscle weakness (generalized) (M62.81);Dizziness and giddiness (R42)   Activity Tolerance Patient tolerated treatment well   Patient Left in bed;with call bell/phone within reach;with family/visitor present   Nurse Communication Mobility status        Time: 1404-1430 OT Time Calculation (min): 26 min  Charges: OT General Charges $OT Visit: 1 Visit OT Treatments $Therapeutic Activity: 8-22 mins  Alfonse Flavors, OTA Acute Rehabilitation Services  Office [phone]   Dewain Penning 10/02/2022, 2:46  PM

## 2022-10-02 NOTE — Progress Notes (Signed)
Progress Note    LORRAINE TERRIQUEZ   RUE:[id number]  DOB: [birth year]  DOA: 08/28/2022     35 PCP: [name], NP  Initial CC: abd pain, N/V  Hospital Course: 67 year old male with history of ESRD on HD TTS, PVD,COPD, essential hypertension, chronic systolic congestive heart failure with ejection fraction 35 to 40%, presented with abdominal pain nausea and diaphoresis. Patient was also hypotensive with a lactic acidosis and was admitted to ICU,with a pulseless left lower extremity was found to have femoral pseudoaneurysm.Culture came back with MRSA bacteremia-TEE neg for vegetations.   Events: 08/29/22: -Admitted to ICU-on vasopressors for septic shock -s/p OR with Dr. Karin Lieu for left femoral exploration with pseudoaneurysm repair,hematoma evacuation>showed dehiscence at proximal aspect of bovine pericardial patch at the suture line.  -Emergent CRRT for severe hyperkalemia 09/08/22 Transferred to [facility] service  09/09/22 CTA for bleeding on left groin: showed Thrombosis of LEFT CFA pseudoaneurysm with new 3.5 cm filling defect, consistent with arterial thrombus, extending from the PSA neck and into the distal CFA. This is at risk for distal embolization and potential acute LEFT lower extremity ischemia.New 8 mm distal LEFT EIA pseudoaneurysm, medial to the stent distal landing zone. Mild interval enlargement of retroperitoneal hematoma, in a craniocaudal dimension measuring 12.2 cm (previously 11.2 cm).No CTA evidence of active extravasation. Patent RIGHT femoral-to-popliteal graft and bilateral iliac stents.Severe burden of splanchnic arterial atherosclerosis, at risk for chronic mesenteric ischemia. 09/09/22: urgent OR:S/P left common iliac to profunda femoris bypass with ipsilateral nonreversed greater saphenous vein in anatomic tunnel, left common iliac artery angioplasty and stenting, left greater saphenous vein harvest, left sartorius muscle of left groin, left lower extremity angiography with  first-order cannulation Transferred to 2H post op 09/12/22 am: 2 BRBPR> s/p Flex sig>showed 5 rectal ulcers, not actively bleeding- stool brown.advised to avoid constipation, added Miralax BID 09/13/22 AM: again 2 large bloody BM ~ 500cc> got 1 unit PRBC  Interval History:   Only got 90 cc blood yesterday in HD? So not surprising Hgb small improvement from 6.9 >> 7.1 g/dL this morning.  Also had another blunt discussion with patient privately in room and then daughter and his wife in hallway. Unfortunately treatments are proving to be futile (transfusion dependent) and ongoing gangrene/necrosis of left toes. Became hypotensive with HD yesterday too.  Discussed with patient that medical measures are now to be pretty futile as the trend has unfortunately proven itself over these past few weeks. His daughter and wife do seem to understand the poor prognosis and I think patient deep down does but he is clinging to any chance of hope/recovery or stability.   I've also re-engaged palliative and they will plan to see either today or over the weekend.   Assessment and Plan:  Acute blood loss anemia from bleeding anal/rectal ulcers possibly from his Crohn's disease - Patient underwent flex sigmoidoscopy on 4/6 was found to have 5 nonbleeding ulcers.  He also underwent colonoscopy which showed multiple rectal ulcers with visible bleeding underwent hemostasis. - Patient developed massive rectal bleeding again on 4/18 - Patient is not a candidate for IR intervention due to severe peripheral vascular disease and anatomy - General surgery on board, was taken to the OR  on 4/20 and underwent S/p EUA and over sewing of ulcers and packing anal canal with gelfoam by Dr. Magnus Ivan. - continue budesonide - Per last surgical notes there may be no options for intervention should the patient have any hemodynamically significant bleeding.  See surgical note  from Dr. Magnus Ivan 4/21.  Poor prognosis.  Strongly recommend DNR  status. - last seen by palliative on 4/20, will re-engage; discussed poor prognosis with patient again this morning -Remains transfusion dependent and medical care now strongly being considered futile especially with even ongoing dialysis as patient now likely to be bedridden and progressive poor quality of life/functional status   PVD  Left common femoral artery pseudoaneurysm S/p left groin hematoma evacuation, primary repair of the distal external iliac artery. Further complicated on 4/3 by left groin bleeding with new pseudoaneurysm and arterial thrombus. S/p iliofemoral bypass and removal of infected patch with sartorius muscle flap. Further management as per vascular surgery.  - Continue with wound VAC - given his prognosis and current state, doubt would even heal well from amputation    MRSA bacteremia secondary to postop wound infection with MRSA and Enterococcus faecium. Infectious disease note from 4/8 recommending daptomycin for 6 weeks followed by indefinite oral suppression of antibiotics. Monitor CPK weekly TEE is negative for vegetations. Patient will need outpatient follow-up with infectious disease to determine oral antibiotics. - continue daptomycin for now; Last dose of IV antibiotics on 10/23/22   ESRD on HD Nephrology on board and dialysis via right IJ tunneled catheter. Further HD sessions as per nephrology - I have recommended patient consider discontinuation of dialysis in setting of overall poor prognosis in context of his overall clinical situation; palliative care to help facilitate further also   Chronic systolic heart failure Last echocardiogram from January 2024 showed left ventricular ejection fraction of 35 to 40% Pulmonary hypertension Cannot restart antiplatelet agents at this time Fluid management as per dialysis.   Acute metabolic encephalopathy  - from uremia, acidosis and acute 4 mm right subinsular stroke   Retroperitoneal hemorrhage Appears to  be Stable    Moderate malnutrition - continue diet   Hypertension:  On metoprolol. Suspect a component of pain.  Continue with hydralazine as needed   COPD:  No wheezing heard.  On RA  Old records reviewed in assessment of this patient   DVT prophylaxis:  SCD's Start: 09/09/22 1841   Code Status:   Code Status: Full Code  Mobility Assessment (last 72 hours)     Mobility Assessment     Row Name 10/02/22 1400 10/02/22 0824 10/02/22 0358 10/01/22 2323 10/01/22 0830   Does patient have an order for bedrest or is patient medically unstable -- No - Continue assessment Yes- Bedfast (Level 1) - Complete Yes- Bedfast (Level 1) - Complete No - Continue assessment   What is the highest level of mobility based on the progressive mobility assessment? Level 2 (Chairfast) - Balance while sitting on edge of bed and cannot stand Level 2 (Chairfast) - Balance while sitting on edge of bed and cannot stand Level 1 (Bedfast) - Unable to balance while sitting on edge of bed Level 1 (Bedfast) - Unable to balance while sitting on edge of bed Level 2 (Chairfast) - Balance while sitting on edge of bed and cannot stand   Is the above level different from baseline mobility prior to current illness? -- Yes - Recommend PT order Yes - Recommend PT order Yes - Recommend PT order Yes - Recommend PT order    Row Name 10/01/22 0444 09/30/22 2339 09/30/22 1935 09/30/22 1400 09/30/22 1300   Does patient have an order for bedrest or is patient medically unstable Yes- Bedfast (Level 1) - Complete Yes- Bedfast (Level 1) - Complete Yes- Bedfast (Level 1) - Complete -- --  What is the highest level of mobility based on the progressive mobility assessment? Level 1 (Bedfast) - Unable to balance while sitting on edge of bed Level 1 (Bedfast) - Unable to balance while sitting on edge of bed Level 1 (Bedfast) - Unable to balance while sitting on edge of bed Level 1 (Bedfast) - Unable to balance while sitting on edge of bed Level  1 (Bedfast) - Unable to balance while sitting on edge of bed   Is the above level different from baseline mobility prior to current illness? Yes - Recommend PT order Yes - Recommend PT order Yes - Recommend PT order -- --    Row Name 09/29/22 2000           Does patient have an order for bedrest or is patient medically unstable No - Continue assessment       What is the highest level of mobility based on the progressive mobility assessment? Level 1 (Bedfast) - Unable to balance while sitting on edge of bed       Is the above level different from baseline mobility prior to current illness? Yes - Recommend PT order                Barriers to discharge:  Disposition Plan:  Home Status is: Inpt  Objective: Blood pressure (!) 167/79, pulse 88, temperature 98.1 F (36.7 C), temperature source Oral, resp. rate 20, height 5' 7.99" (1.727 m), weight 85 kg, SpO2 97 %.  Examination:  Physical Exam Constitutional:      General: He is not in acute distress.    Appearance: Normal appearance. He is well-developed.  HENT:     Head: Normocephalic and atraumatic.     Mouth/Throat:     Mouth: Mucous membranes are moist.  Eyes:     Extraocular Movements: Extraocular movements intact.  Cardiovascular:     Rate and Rhythm: Normal rate and regular rhythm.     Heart sounds: Normal heart sounds.  Pulmonary:     Effort: Pulmonary effort is normal. No respiratory distress.     Breath sounds: Normal breath sounds. No wheezing.  Abdominal:     General: Bowel sounds are normal. There is no distension.     Palpations: Abdomen is soft.     Tenderness: There is no abdominal tenderness.  Genitourinary:    Comments: Left groin WV in place Musculoskeletal:        General: Normal range of motion.     Cervical back: Normal range of motion and neck supple.     Comments: Gangrenous left toes  Skin:    General: Skin is warm and dry.  Neurological:     General: No focal deficit present.     Mental  Status: He is alert.  Psychiatric:        Mood and Affect: Mood normal.        Behavior: Behavior normal.      Consultants:  Nephrology Palliative care General surgery Vascular surgery  Procedures:    Data Reviewed: Results for orders placed or performed during the hospital encounter of 08/28/22 (from the past 24 hour(s))  Renal function panel (daily at 0500)     Status: Abnormal   Collection Time: 10/02/22  5:39 AM  Result Value Ref Range   Sodium 136 135 - 145 mmol/L   Potassium 4.2 3.5 - 5.1 mmol/L   Chloride 98 98 - 111 mmol/L   CO2 27 22 - 32 mmol/L   Glucose, Bld 91 70 -  99 mg/dL   BUN 46 (H) 8 - 23 mg/dL   Creatinine, Ser 1.61 (H) 0.61 - 1.24 mg/dL   Calcium 8.1 (L) 8.9 - 10.3 mg/dL   Phosphorus 3.7 2.5 - 4.6 mg/dL   Albumin 2.0 (L) 3.5 - 5.0 g/dL   GFR, Estimated 14 (L) >60 mL/min   Anion gap 11 5 - 15  Hemoglobin and hematocrit, blood     Status: Abnormal   Collection Time: 10/02/22  5:39 AM  Result Value Ref Range   Hemoglobin 7.1 (L) 13.0 - 17.0 g/dL   HCT 09.6 (L) 04.5 - 40.9 %    I have reviewed pertinent nursing notes, vitals, labs, and images as necessary. I have ordered labwork to follow up on as indicated.  I have reviewed the last notes from staff over past 24 hours. I have discussed patient's care plan and test results with nursing staff, CM/SW, and other staff as appropriate.  Time spent: Greater than 50% of the 55 minute visit was spent in counseling/coordination of care for the patient as laid out in the A&P.   LOS: 35 days   Lewie Chamber, MD [facility] 10/02/2022, 3:01 PM

## 2022-10-02 NOTE — Progress Notes (Signed)
Physical Therapy Wound Treatment Patient Details  Name: Albert Harris MRN: [id number] Date of Birth: 07-23-1955  Today's Date: 10/02/2022 Time: 4098-1191 Time Calculation (min): 32 min  Subjective  Subjective Assessment Subjective: Pt pleasant and agreeable to wound therapy Patient and Family Stated Goals: Heal wound Date of Onset:  (unknown) Prior Treatments: Dressing changes  Pain Score:  Premedicated. Minimal pain.  Wound Assessment  Pressure Injury 08/28/22 Sacrum Medial Unstageable - Full thickness tissue loss in which the base of the injury is covered by slough (yellow, tan, gray, green or brown) and/or eschar (tan, brown or black) in the wound bed. Sacral wound with eschar coveri (Active)  Dressing Type Santyl;Foam - Lift dressing to assess site every shift;Gauze (Comment);Barrier Film (skin prep);Moist to moist 10/02/22 0959  Dressing Changed;Clean, Dry, Intact 10/02/22 0959  Dressing Change Frequency Daily 10/02/22 0959  State of Healing Eschar 10/02/22 0959  Site / Wound Assessment Pink;Red;Yellow;Brown;Black;Painful 10/02/22 0959  % Wound base Red or Granulating 45% 10/02/22 0959  % Wound base Yellow/Fibrinous Exudate 35% 10/02/22 0959  % Wound base Black/Eschar 20% 10/02/22 0959  % Wound base Other/Granulation Tissue (Comment) 0% 10/02/22 0959  Peri-wound Assessment Erythema (non-blanchable) 10/02/22 0959  Wound Length (cm) 11 cm 09/29/22 1327  Wound Width (cm) 12 cm 09/29/22 1327  Wound Depth (cm) 3.5 cm 09/29/22 1327  Wound Surface Area (cm^2) 132 cm^2 09/29/22 1327  Wound Volume (cm^3) 462 cm^3 09/29/22 1327  Tunneling (cm) 0 09/29/22 1327  Undermining (cm) 3.0 cm 1:00-4:00 09/29/22 1327  Margins Unattached edges (unapproximated) 10/02/22 0959  Drainage Amount Minimal 10/02/22 0959  Drainage Description Serosanguineous 10/02/22 0959  Treatment Debridement (Selective);Irrigation;Packing (Saline gauze) 10/02/22 0959      Selective Debridement  (non-excisional) Selective Debridement (non-excisional) - Location: Sacrum Selective Debridement (non-excisional) - Tools Used: Forceps, Scalpel Selective Debridement (non-excisional) - Tissue Removed: yellow and black nonviable tissue    Wound Assessment and Plan  Wound Therapy - Assess/Plan/Recommendations Wound Therapy - Clinical Statement: Progressing with debridement and wound bed appears improved. This patient will benefit from continued wound therapy for selective removal of unviable tissue, to decrease bioburden, and promote wound bed healing. Wound Therapy - Functional Problem List: Decreased tolerance for repositioning and OOB due to wound; global weakness in the setting of extended hospitalization and now ICU stay. Factors Delaying/Impairing Wound Healing: Immobility, Multiple medical problems, Polypharmacy Hydrotherapy Plan: Debridement, Dressing change, Patient/family education Wound Therapy - Frequency: 2X / week Wound Therapy - Follow Up Recommendations: dressing changes by RN  Wound Therapy Goals- Improve the function of patient's integumentary system by progressing the wound(s) through the phases of wound healing (inflammation - proliferation - remodeling) by: Wound Therapy Goals - Improve the function of patient's integumentary system by progressing the wound(s) through the phases of wound healing by: Decrease Necrotic Tissue to: 20% Decrease Necrotic Tissue - Progress: Progressing toward goal Increase Granulation Tissue to: 80% Increase Granulation Tissue - Progress: Progressing toward goal  Goals will be updated until maximal potential achieved or discharge criteria met.  Discharge criteria: when goals achieved, discharge from hospital, MD decision/surgical intervention, no progress towards goals, refusal/missing three consecutive treatments without notification or medical reason.  GP     Charges PT Wound Care Charges $Wound Debridement up to 20 cm: < or equal to 20  cm $ Wound Debridement each add'l 20 sqcm: 3 $PT Hydrotherapy Visit: 1 Visit       [name] [facility] 10/02/2022, 10:05 AM [name] PT Acute Rehabilitation Services Office [phone]

## 2022-10-02 NOTE — Progress Notes (Signed)
  Progress Note    10/02/2022 1:01 PM 6 Days Post-Op  VAC changed today. See Dr. Karin Lieu note for complete progress note  Healthy appearing granulation tissue in wound bed as shown above Healing well Black sponge and VAC reapplied with good seal Next VAC change will be Monday 4/29  Graceann Congress, PA-C [facility] [phone] 10/02/2022 1:01 PM

## 2022-10-02 NOTE — Progress Notes (Signed)
Pharmacy Antibiotic Note  Albert Harris is a 67 y.o. 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 Now with MRSA bacteremia. Noted patient is ESRD HD TTS PTA, with a right arm AV graft and a catheter in his right IJ. Pharmacy consulted to dose daptomycin due to enterococcus faecium and staph aureus growing from tissue cultures from 4/3.  HD on TTS schedule- didn't tolerate 4/25 session well due to low BP and anemia.   Plan: Continue Daptomycin 600mg  (8 mg/kg) IV post HD on Tues/Thurs and 750 mg (10 m/gk) IV post HD on Saturdays Monitor weekly CK, follow HD schedule Discharge orders pended   Height: 5' 7.99" (172.7 cm) Weight: 85 kg (187 lb 6.3 oz) IBW/kg (Calculated) : 68.38  Temp (24hrs), Avg:98.3 F (36.8 C), Min:97.5 F (36.4 C), Max:99.1 F (37.3 C)  Recent Labs  Lab 09/28/22 0340 09/29/22 0630 09/29/22 0957 09/30/22 0803 10/01/22 0533 10/01/22 1224 10/02/22 0539  WBC 12.8*  --  12.3* 13.6*  --  15.1*  --   CREATININE 4.69*   < > 6.41* 4.37* 5.93* 6.30* 4.30*   < > = values in this interval not displayed.     Estimated Creatinine Clearance: 17.4 mL/min (A) (by C-G formula based on SCr of 4.3 mg/dL (H)).    Allergies  Allergen Reactions   Benadryl [Diphenhydramine] Rash   Lidocaine-Prilocaine Rash    Caused red marks up and down arm   Antimicrobials this admission:  Cefazolin 3/23 x 1 Vancomycin 3/24 >> 4/5 Zosyn 4/3 >>4/3 Daptomycin 4/5>>5/17  CK 4/8 57 4/16 25 4/22 18  Microbiology results:  4/3 L groin wound: MSRA + VRE 4/3 MRSA PCR: neg 3/27 Bcx: neg 3/25 Bcx: MRSA 3/23 Bcx: MRSA   Alphia Moh, PharmD, BCPS, BCCP Clinical Pharmacist  Please check AMION for all [facility] Pharmacy phone numbers After 10:00 PM, call Main Pharmacy [phone]

## 2022-10-02 NOTE — Progress Notes (Signed)
  Progress Note    10/02/2022 11:09 AM 6 Days Post-Op  Subjective:  smiling comfortable.    Vitals:   10/02/22 0547 10/02/22 0839  BP: 131/71 134/65  Pulse: 78 87  Resp: 18 20  Temp: 99.1 F (37.3 C) 98.9 F (37.2 C)  SpO2: 99% 93%   Physical Exam: Cardiac:  regular Lungs:  non labored Incisions: Left groin with VAC to suction Extremities: LLE with faint PT/Pero signals. Foot/toes cool Neurologic: alert and oriented  CBC    Component Value Date/Time   WBC 15.1 (H) 10/01/2022 1224   RBC 2.22 (L) 10/01/2022 1224   HGB 7.1 (L) 10/02/2022 0539   HGB 10.4 (L) 06/18/2021 1431   HCT 22.2 (L) 10/02/2022 0539   HCT 31.6 (L) 06/18/2021 1431   PLT 210 10/01/2022 1224   PLT 142 (L) 06/18/2021 1431   MCV 98.2 10/01/2022 1224   MCV 96 06/18/2021 1431   MCH 31.1 10/01/2022 1224   MCHC 31.7 10/01/2022 1224   RDW 19.9 (H) 10/01/2022 1224   RDW 14.3 06/18/2021 1431   LYMPHSABS 1.3 09/30/2022 0803   LYMPHSABS 1.5 06/18/2021 1431   MONOABS 1.1 (H) 09/30/2022 0803   EOSABS 0.4 09/30/2022 0803   EOSABS 1.0 (H) 06/18/2021 1431   BASOSABS 0.1 09/30/2022 0803   BASOSABS 0.1 06/18/2021 1431    BMET    Component Value Date/Time   NA 136 10/02/2022 0539   NA 141 06/18/2021 1431   K 4.2 10/02/2022 0539   CL 98 10/02/2022 0539   CO2 27 10/02/2022 0539   GLUCOSE 91 10/02/2022 0539   BUN 46 (H) 10/02/2022 0539   BUN 20 06/18/2021 1431   CREATININE 4.30 (H) 10/02/2022 0539   CREATININE 1.15 02/19/2017 1049   CALCIUM 8.1 (L) 10/02/2022 0539   CALCIUM 8.5 (L) 06/10/2020 0900   GFRNONAA 14 (L) 10/02/2022 0539   GFRAA 6 (L) 03/04/2020 1045    INR    Component Value Date/Time   INR 1.2 08/12/2022 1501     Intake/Output Summary (Last 24 hours) at 10/02/2022 1109 Last data filed at 10/02/2022 0000 Gross per 24 hour  Intake 580 ml  Output 1600 ml  Net -1020 ml      Assessment/Plan:  67 y.o. male is s/p removal of infected left femoral patch, iliofemoral bypass, with  sartorius muscle flap 09/09/22  6 Days Post-Op   LLE remains stable with faint pt/pero signals.  The amputation on the road Continue multimodal pain control Will change VAC dressing today Continue Therapies as able Had an honest discussion regarding palliative care and wishes moving forward.  Dreshon is thinking about his options.  [facility] [phone] 10/02/2022 11:09 AM

## 2022-10-02 NOTE — Progress Notes (Signed)
Daily Progress Note   Patient Name: Albert Harris       Date: 10/02/2022 DOB: September 21, 1955  Age: 67 y.o. MRN#: [id number] Attending Physician: Lewie Chamber, MD Primary Care Physician: [name], NP Admit Date: 08/28/2022 Length of Stay: 35 days  Reason for Consultation/Follow-up: Establishing goals of care  HPI/Patient Profile:  67 y.o. male  with past medical history of ESRD on HD, peripheral vascular disease, chronic systolic CHF with EF 35 to 40%, and COPD.  He presented to [facility] on 08/28/2022 with abdominal pain, nausea, and unable to feel his left foot.  He was found to have a left femoral pseudoaneurysm and was taken to the OR for repair of his distal left external iliac artery.  He required emergent CRRT due to hypotension and hyperkalemia.  Hospitalization has been complicated by MRSA bacteremia secondary to post-op wound infection and recurrent rectal bleeding requiring multiple transfusions.    Palliative Medicine was consulted for goals of care in the setting of ESRD, severe vascular disease, and bleeding.  Subjective:   Subjective: Chart Reviewed. Updates received. Patient Assessed. Created space and opportunity for patient  and family to explore thoughts and feelings regarding current medical situation.  Today's Discussion: Today saw the patient at the bedside, his daughter was present.  His daughter excused herself so we could have a private conversation.  We reiterated all of the unfortunate events of his hospitalization.  We discussed that the 2 main issues are his peripheral vascular disease at high risk for amputation, although it does not certain whether he would be a surgical candidate.  We also discussed his other big issue of the continued GI bleeding for which surgery does not feel there is much left in the form of interventions.  In this situation continued blood product use can result in likely development of antibodies that would reduce the resources available to  him for blood products.  We specifically talked about CODE STATUS.  I discussed resuscitation as a last ditch attempt to bring somebody back to life after they have passed and are pulseless and breathless.  I discussed that in his situation, with his significant illness that he has, he is unlikely to have a good outcome.  He understands.  We also talked about options moving forward.  He is often transferring full and aggressive care despite unlikelihood of good outcomes until things ultimately fail and he passes away.  The other option would be to focus on comfort care and maximizing quality of life for however much time is left, including possible hospice services at home.  He does state that when it is time he wants to be at home he passes hospital.  After extensive conversation he seems to understand the gravity of the situation.  He knows that he is not doing well when asked him he said he is not surprised to hear this.  I discussed that if there is a decision to be made first I would highly consider changing CODE STATUS to DNR.  He seems to understand and plans to talk with his family about this.  I shared that we are available at any time he needs [place].  I told him we would give a day for the information to sink and as it has a lot of inflammation going on.  I told him I would follow-up with him on Sunday.  I provided emotional and general support through therapeutic listening, empathy, sharing of stories, therapeutic touch, and other techniques. I answered all  questions and addressed all concerns to the best of my ability.  Review of Systems  Respiratory:  Negative for cough and shortness of breath.   Gastrointestinal:  Negative for abdominal pain, blood in stool (None today), nausea and vomiting.  Musculoskeletal:        Notes pain in his ankles    Objective:   Vital Signs:  BP (!) 167/79 (BP Location: Left Arm)   Pulse 88   Temp 98.1 F (36.7 C) (Oral)   Resp 20   Ht 5' 7.99" (1.727  m)   Wt 85 kg   SpO2 97%   BMI 28.50 kg/m   Physical Exam: Physical Exam Vitals and nursing note reviewed.  Constitutional:      General: He is not in acute distress.    Appearance: He is ill-appearing.  HENT:     Head: Normocephalic and atraumatic.  Cardiovascular:     Rate and Rhythm: Normal rate.  Pulmonary:     Effort: Pulmonary effort is normal. No respiratory distress.  Abdominal:     General: Abdomen is flat.  Skin:    General: Skin is warm and dry.     Comments: Noted dressings in place bilateral ankles, clean dry and intact  Neurological:     General: No focal deficit present.     Mental Status: He is alert.  Psychiatric:        Mood and Affect: Mood normal.        Behavior: Behavior normal.     Palliative Assessment/Data: 30%    Existing Vynca/ACP Documentation: Advanced directive signed 09/23/2022  Assessment & Plan:   Impression: Present on Admission:  (Resolved) Lactic acidosis  (Resolved) Metabolic encephalopathy  Lower GI bleed  SUMMARY OF RECOMMENDATIONS   Full code for now Continue repeated messaging on the seriousness of his illness Allow time for family discussion In the meantime continue full scope of care PMT will follow-up 10/04/2022  Symptom Management:  Per primary team PMT is available to assist as needed  Code Status: Full code  Prognosis: Unable to determine  Discharge Planning: To Be Determined  Discussed with: Patient, medical team, nursing team  Thank you for allowing [place] to participate in the care of BRAXTIN BAMBA PMT will continue to support holistically.  Time Total: 60 min  Visit consisted of counseling and education dealing with the complex and emotionally intense issues of symptom management and palliative care in the setting of serious and potentially life-threatening illness. Greater than 50%  of this time was spent counseling and coordinating care related to the above assessment and plan.  Wynne Dust,  NP Palliative Medicine Team  Team Phone # [phone] (Nights/Weekends)  02/04/2021, 8:17 AM

## 2022-10-02 NOTE — Progress Notes (Signed)
[facility] Progress Note   Subjective:    Had HD-  removed 1500 but had low BP-  got another unit of blood for bleeding and hgb 6.9-  only 7.1 this AM-  he is pleasant- no real c/os   Objective Vitals:   10/01/22 2342 10/02/22 0500 10/02/22 0547 10/02/22 0839  BP: (!) 157/74  131/71 134/65  Pulse: 94  78 87  Resp: 18  18 20   Temp: 98 F (36.7 C)  99.1 F (37.3 C) 98.9 F (37.2 C)  TempSrc: Oral  Oral Oral  SpO2: 100%  99% 93%  Weight:  85 kg    Height:       Physical Exam General: Awake, alert,  On RA, NAD Heart: RRR; no murmur Lungs: CTA anteriorly Abdomen: soft Extremities: dep edema; L foot with ischemic distal 3rd-5th toes [facility]ialysis Access: [facility]  Filed Weights   10/01/22 1351 10/01/22 1549 10/02/22 0500  Weight: 84 kg 82.5 kg 85 kg    Intake/Output Summary (Last 24 hours) at 10/02/2022 0950 Last data filed at 10/02/2022 0000 Gross per 24 hour  Intake 580 ml  Output 1600 ml  Net -1020 ml    Additional Objective Labs: Basic Metabolic Panel: Recent Labs  Lab 10/01/22 0533 10/01/22 1224 10/02/22 0539  NA 138 138 136  K 4.9 5.0 4.2  CL 100 100 98  CO2 26 26 27   GLUCOSE 106* 112* 91  BUN 67* 75* 46*  CREATININE 5.93* 6.30* 4.30*  CALCIUM 7.9* 8.0* 8.1*  PHOS 5.2* 5.3* 3.7   Liver Function Tests: Recent Labs  Lab 10/01/22 0533 10/01/22 1224 10/02/22 0539  ALBUMIN <1.5* <1.5* 2.0*   No results for input(s): "LIPASE", "AMYLASE" in the last 168 hours. CBC: Recent Labs  Lab 09/28/22 0340 09/29/22 0957 09/30/22 0803 09/30/22 1331 10/01/22 0949 10/01/22 1224 10/02/22 0539  WBC 12.8* 12.3* 13.6*  --   --  15.1*  --   NEUTROABS 10.7* 10.3* 10.6*  --   --   --   --   HGB 8.1* 8.8* 8.7*   < > 7.2* 6.9* 7.1*  HCT 24.9* 26.4* 26.6*   < > 22.8* 21.8* 22.2*  MCV 94.3 94.6 96.0  --   --  98.2  --   PLT 159 198 211  --   --  210  --    < > = values in this interval not displayed.   Blood Culture    Component Value Date/Time   SDES  TISSUE 09/09/2022 1453   SPECREQUEST VESSEL PT ON VANC ANCEF 09/09/2022 1453   CULT  09/09/2022 1453    RARE STAPHYLOCOCCUS AUREUS SUSCEPTIBILITIES PERFORMED ON PREVIOUS CULTURE WITHIN THE LAST 5 DAYS. NO ANAEROBES ISOLATED Performed at [facility], [address] [place], [place] [address]    REPTSTATUS 09/14/2022 FINAL 09/09/2022 1453    Cardiac Enzymes: Recent Labs  Lab 09/28/22 0340  CKTOTAL 18*   CBG: Recent Labs  Lab 09/25/22 1109 09/25/22 1546 09/28/22 1617  GLUCAP 92 112* 99   Iron Studies: No results for input(s): "IRON", "TIBC", "TRANSFERRIN", "FERRITIN" in the last 72 hours. Lab Results  Component Value Date   INR 1.2 08/12/2022   INR 1.3 (H) 06/29/2022   INR 1.3 (H) 06/28/2022   Studies/Results: No results found.  Medications:  sodium chloride 0 mL/hr at 09/22/22 0757   DAPTOmycin (CUBICIN) 600 mg in sodium chloride 0.9 % IVPB 600 mg (10/01/22 1812)   DAPTOmycin (CUBICIN) 750 mg in sodium chloride  0.9 % IVPB Stopped (09/26/22 1731)   sodium chloride      (feeding supplement) PROSource Plus  30 mL Oral TID WC   sodium chloride   Intravenous Once   sodium chloride   Intravenous Once   acetaminophen  650 mg Oral Q6H   ascorbic acid  250 mg Oral BID   budesonide  9 mg Oral Daily   calcium acetate  1,334 mg Oral TID WC   Chlorhexidine Gluconate Cloth  6 each Topical Q0600   Chlorhexidine Gluconate Cloth  6 each Topical Q0600   collagenase   Topical Daily   darbepoetin (ARANESP) injection - DIALYSIS  200 mcg Subcutaneous Q Wed-1800   hydrOXYzine  10 mg Oral Once   liver oil-zinc oxide   Topical TID   methocarbamol  500 mg Oral TID   metoprolol succinate  25 mg Oral Daily   multivitamin  1 tablet Oral QHS   nutrition supplement (JUVEN)  1 packet Oral BID BM   pantoprazole  40 mg Oral BID   polyethylene glycol  17 g Oral Daily   rosuvastatin  10 mg Oral Daily   sodium chloride flush  10-40 mL Intracatheter Q12H   vitamin A  10,000 Units Oral  Daily    Dialysis Orders: TTS SW  4h   350/800  64.5kg  RFA AVG -> clotted/ new TDC in place Heparin none  - last OP HD 3/14, post wt 65.9kg - venofer 50mg  IV weekly - rocaltrol 0.25 mcg po tiw - phoslo 2 ac tid - no esa, last Hb 11.0  Assessment/Plan: Recurrent lower GIB: S/p colonoscopy 09/15/22, then repeat sigmoidoscopy 4/11. Several bleeding ulcers addressed.  Path came back + Crohn's - started on mesalamine by GI. Brisk re-bleed 4/18, back for emergent sigmoidoscopy with hemostatic gel applied to bleeding ulcer. Patient started bleeding again from his rectum/ anus -s/p suturing of bleeding ulcers 4/20 by Dr. Magnus Ivan-  this continues to be a problem Left common femoral artery pseudoaneurysm - s/p L femoral exploration with pseudoaneurysm repair 08/29/22, then iliofemoral bypass, removal of infected patch with sartorius muscle flap on 09/09/22. Ischemic changes to L 3rd-5th toes - still high risk of limb loss.   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  CVA: + stroke on MRI. Neuro signed off.  ESRD: Continue HD on TTS schedule, did require short-term CRRT 3/23- 3/26. Now on IHD. Next HD 4/27  per his usual schedule. BP/volume: Variable, 1+ edema on exam. Push UF as tolerated Anemia (ESRD + ABLA): Last transfused 1U on 4/25 (that brings total to 25U this admit). Also on Aranesp q Wed (max dose). Staying pretty stable  Secondary HPTH: CorrCa high but now at goal, Phos ok. Continue holding VDRA , continue phoslo for now. Dialysis access: AVG clotted 3/27, went for declot by VVS and then re-clotted. VVS placed R TDC on 4/1.  Albumin less than 1.5-  30 + days of hosp today -  have asked palliative care to re engage-  daughter seems to get it but pt does not.  Would be appropriate to be at least DNR  Cecille Aver  [facility] 10/02/2022,9:50 AM  LOS: 35 days

## 2022-10-02 NOTE — Progress Notes (Signed)
Physical Therapy Treatment Patient Details Name: Albert Harris MRN: [id number] DOB: Mar 14, 1956 Today's Date: 10/02/2022   History of Present Illness 67 year old male  ER with abdominal pain, nausea, diaphoresis and unable to feel his Lt foot. Hypotensive with lactic acidosis in ER.  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 CRRT 3/22-3/26.  3/22 repair of distal Lt external iliac artery. On 3/30, Hgb drop from 9 to 7 with worsening abdominal pain, CT abd 3/30 shows slightly larger anterior pelvic extraperitoneal hematoma. 4/18 -  massive hemoptysis requiring 4 units PRBC, bedside flex sig with bleeding ulcer, hemostatic gel applied, ETT 4/18-19. PMHx: PAD, ESRD HD, recently hospitalized 3/16-18 for acute on chronic left lower extremity ischemia.    PT Comments    Pt received in supine, agreeable to therapy session after premedication and with good participation and improved tolerance for sitting EOB, BLE exercises and transfer training. Pt needing up to +2 maxA for bed mobility and attempted x2 to stand to [facility] with +2 max/totalA but unable to fully extend hips or maintain upright without totalA and only tolerated a few seconds upright each attempt. BP more stable with sitting this date but evolving orthostatic symptoms after standing with more pale pallor and pt c/o significant fatigue. Pt would benefit from Prevalon boots to offload LLE and promote neutral ankle/hip posture in supine, RN notified (order placed by MD 4/24 but item not arrived to his room yet). Pt continues to benefit from PT services to progress toward functional mobility goals.    Recommendations for follow up therapy are one component of a multi-disciplinary discharge planning process, led by the attending physician.  Recommendations may be updated based on patient status, additional functional criteria and insurance authorization.  Follow Up Recommendations  Can patient physically be transported by  private vehicle: No    Assistance Recommended at Discharge Frequent or constant Supervision/Assistance  Patient can return home with the following Assist for transportation;Assistance with cooking/housework;Help with stairs or ramp for entrance;A lot of help with walking and/or transfers;A lot of help with bathing/dressing/bathroom   Equipment Recommendations  Other (comment) (defer; currently would need mechanical lift, hospital bed with air mattress and reclining back wheelchair with roho cushion and elevating leg rests)    Recommendations for Other Services       Precautions / Restrictions Precautions Precautions: Fall Precaution Comments: L groin wound vac; Per verbal order of Dr. Chestine Spore and Dr. Lenell Antu 4/19 - unrestricted L hip ROM, pt appropriate to move OOB and mobilize as appropriate. Restrictions Weight Bearing Restrictions: Yes LLE Weight Bearing: Weight bearing as tolerated     Mobility  Bed Mobility Overal bed mobility: Needs Assistance Bed Mobility: Rolling, Sit to Sidelying, Sidelying to Sit Rolling: Mod assist Sidelying to sit: +2 for physical assistance, Max assist     Sit to sidelying: Mod assist, +2 for physical assistance General bed mobility comments: education on log rolling technique with assistance for trunk and BLEs, patient able to assist more with sit to sidelying requiring more assistance for BLEs than trunk    Transfers Overall transfer level: Needs assistance Equipment used: Ambulation equipment used Transfers: Sit to/from Stand Sit to Stand: Max assist, +2 physical assistance           General transfer comment: 2 sit to stands performed from EOB to [facility] with max assist x2 and patient with less than full trunk extension but nearly fully upright. assist with gait belt and bed pad under hips to achieve upright  posture.    Ambulation/Gait             Pre-gait activities: pt unable to weight shift standing to stedy     Stairs              Wheelchair Mobility    Modified Rankin (Stroke Patients Only)       Balance Overall balance assessment: Needs assistance Sitting-balance support: Bilateral upper extremity supported, Single extremity supported, No upper extremity supported, Feet unsupported Sitting balance-Leahy Scale: Fair Sitting balance - Comments: min assist for balance initially and progressed to min guard. Min assist again at end of session due to fatigue Postural control: Posterior lean Standing balance support: Bilateral upper extremity supported, During functional activity, Reliant on assistive device for balance Standing balance-Leahy Scale: Poor (poor to zero) Standing balance comment: reliant on Stedy and therapist for support +2 with Stedy blocking knees                            Cognition Arousal/Alertness: Awake/alert Behavior During Therapy: WFL for tasks assessed/performed Overall Cognitive Status: Within Functional Limits for tasks assessed                                 General Comments: able to recall therapist from previous visit, unaware he had received pain meds within the past hour, cooperative and motivated towards mobility        Exercises General Exercises - Lower Extremity Ankle Circles/Pumps: AROM, AAROM, Both, 10 reps, Supine Long Arc Quad: AROM, AAROM, Both, 10 reps, Seated (unable without AA on LLE) Heel Slides: AROM, AAROM, Both, 5 reps, Supine Other Exercises Other Exercises: lateral lean with elbow taps x2 reps ea Other Exercises: chair push-ups from EOB for tricep strengthening x3 reps emphasis on deweighting his hips Other Exercises: STS x 2 trials for BLE strengthening    General Comments General comments (skin integrity, edema, etc.): BP 163/89 (108) HR 80 supine and BP 149/71 (92) seated. BP 166/73 (96) after return to supine. Pt c/o lightheadedness after standing after seated BP taken.      Pertinent Vitals/Pain Pain  Assessment Pain Assessment: Faces Faces Pain Scale: Hurts even more Pain Location: bottom, LLE Pain Descriptors / Indicators: Grimacing, Guarding, Sore, Constant, Discomfort Pain Intervention(s): Limited activity within patient's tolerance, Monitored during session, Repositioned, Premedicated before session    Home Living                          Prior Function            PT Goals (current goals can now be found in the care plan section) Acute Rehab PT Goals Patient Stated Goal: Pt wants to get better and go home. PT Goal Formulation: With patient/family Time For Goal Achievement: 09/30/22 Progress towards PT goals: Progressing toward goals    Frequency    Min 2X/week      PT Plan Current plan remains appropriate    Co-evaluation PT/OT/SLP Co-Evaluation/Treatment: Yes Reason for Co-Treatment: Complexity of the patient's impairments (multi-system involvement);For patient/therapist safety;To address functional/ADL transfers PT goals addressed during session: Mobility/safety with mobility;Balance;Strengthening/ROM OT goals addressed during session: ADL's and self-care;Strengthening/ROM      AM-PAC PT "6 Clicks" Mobility   Outcome Measure  Help needed turning from your back to your side while in a flat bed without using bedrails?: A Lot Help needed  moving from lying on your back to sitting on the side of a flat bed without using bedrails?: A Lot Help needed moving to and from a bed to a chair (including a wheelchair)?: Total Help needed standing up from a chair using your arms (e.g., wheelchair or bedside chair)?: Total Help needed to walk in hospital room?: Total Help needed climbing 3-5 steps with a railing? : Total 6 Click Score: 8    End of Session Equipment Utilized During Treatment: Gait belt;Other (comment) (bed pads) Activity Tolerance: Patient tolerated treatment well;Patient limited by fatigue Patient left: in bed;with call bell/phone within  reach;with bed alarm set;Other (comment) (x4 rails up per air bed; pt sidelying to his L for comfort) Nurse Communication: Mobility status;Other (comment);Need for lift equipment (he has order for prevalon boots but none in room yet) PT Visit Diagnosis: Other abnormalities of gait and mobility (R26.89);Muscle weakness (generalized) (M62.81);Difficulty in walking, not elsewhere classified (R26.2)     Time: 1610-9604 PT Time Calculation (min) (ACUTE ONLY): 26 min  Charges:  $Therapeutic Exercise: 8-22 mins                     Aiko Belko P., PTA Acute Rehabilitation Services Secure Chat Preferred 9a-5:30pm Office: [phone]    Dorathy Kinsman [facility] 10/02/2022, 5:19 PM

## 2022-10-03 DIAGNOSIS — A419 Sepsis, unspecified organism: Secondary | ICD-10-CM | POA: Diagnosis not present

## 2022-10-03 DIAGNOSIS — D62 Acute posthemorrhagic anemia: Secondary | ICD-10-CM | POA: Diagnosis not present

## 2022-10-03 DIAGNOSIS — N186 End stage renal disease: Secondary | ICD-10-CM | POA: Diagnosis not present

## 2022-10-03 DIAGNOSIS — K922 Gastrointestinal hemorrhage, unspecified: Secondary | ICD-10-CM | POA: Diagnosis not present

## 2022-10-03 LAB — RENAL FUNCTION PANEL
Albumin: 2 g/dL — ABNORMAL LOW (ref 3.5–5.0)
Albumin: 2 g/dL — ABNORMAL LOW (ref 3.5–5.0)
Anion gap: 14 (ref 5–15)
Anion gap: 10 (ref 5–15)
BUN: 75 mg/dL — ABNORMAL HIGH (ref 8–23)
BUN: 28 mg/dL — ABNORMAL HIGH (ref 8–23)
CO2: 25 mmol/L (ref 22–32)
CO2: 29 mmol/L (ref 22–32)
Calcium: 8.4 mg/dL — ABNORMAL LOW (ref 8.9–10.3)
Calcium: 8 mg/dL — ABNORMAL LOW (ref 8.9–10.3)
Chloride: 100 mmol/L (ref 98–111)
Chloride: 97 mmol/L — ABNORMAL LOW (ref 98–111)
Creatinine, Ser: 5.67 mg/dL — ABNORMAL HIGH (ref 0.61–1.24)
Creatinine, Ser: 2.88 mg/dL — ABNORMAL HIGH (ref 0.61–1.24)
GFR, Estimated: 10 mL/min — ABNORMAL LOW (ref >60)
GFR, Estimated: 23 mL/min — ABNORMAL LOW (ref >60)
Glucose, Bld: 90 mg/dL (ref 70–99)
Glucose, Bld: 90 mg/dL (ref 70–99)
Phosphorus: 5.8 mg/dL — ABNORMAL HIGH (ref 2.5–4.6)
Phosphorus: 2.9 mg/dL (ref 2.5–4.6)
Potassium: 5.2 mmol/L — ABNORMAL HIGH (ref 3.5–5.1)
Potassium: 3.6 mmol/L (ref 3.5–5.1)
Sodium: 139 mmol/L (ref 135–145)
Sodium: 136 mmol/L (ref 135–145)

## 2022-10-03 LAB — CBC
HCT: 24.2 % — ABNORMAL LOW (ref 39.0–52.0)
Hemoglobin: 7.7 g/dL — ABNORMAL LOW (ref 13.0–17.0)
MCH: 30.7 pg (ref 26.0–34.0)
MCHC: 31.8 g/dL (ref 30.0–36.0)
MCV: 96.4 fL (ref 80.0–100.0)
Platelets: 212 10*3/uL (ref 150–400)
RBC: 2.51 MIL/uL — ABNORMAL LOW (ref 4.22–5.81)
RDW: 18.7 % — ABNORMAL HIGH (ref 11.5–15.5)
WBC: 18.2 10*3/uL — ABNORMAL HIGH (ref 4.0–10.5)
nRBC: 0 % (ref 0.0–0.2)

## 2022-10-03 LAB — HEMOGLOBIN AND HEMATOCRIT, BLOOD
HCT: 23.6 % — ABNORMAL LOW (ref 39.0–52.0)
Hemoglobin: 7.5 g/dL — ABNORMAL LOW (ref 13.0–17.0)

## 2022-10-03 MED ORDER — LABETALOL HCL 5 MG/ML IV SOLN
20.0000 mg | INTRAVENOUS | Status: DC | PRN
  Administered 2022-10-03: 20 mg via INTRAVENOUS
  Filled 2022-10-03: qty 4

## 2022-10-03 MED ORDER — HYDRALAZINE HCL 20 MG/ML IJ SOLN
10.0000 mg | Freq: Four times a day (QID) | INTRAMUSCULAR | Status: DC | PRN
  Administered 2022-10-03 – 2022-10-10 (×3): 10 mg via INTRAVENOUS
  Filled 2022-10-03 (×4): qty 1

## 2022-10-03 MED ORDER — HEPARIN SODIUM (PORCINE) 1000 UNIT/ML IJ SOLN
INTRAMUSCULAR | Status: AC
  Administered 2022-10-03: 3200 [IU]
  Filled 2022-10-03: qty 3

## 2022-10-03 NOTE — Progress Notes (Addendum)
   10/03/22 1230  Vitals  Temp 98.9 F (37.2 C)  Temp Source Oral  BP (!) 141/69  MAP (mmHg) 89  BP Location Left Arm  BP Method Automatic  Patient Position (if appropriate) Lying  Pulse Rate 75  Pulse Rate Source Monitor  ECG Heart Rate 75  Resp 17  Oxygen Therapy  SpO2 99 %  O2 Device Room Air  Post Treatment  Dialyzer Clearance Clear  Duration of HD Treatment -hour(s) 3.5 hour(s)  Hemodialysis Intake (mL) 0 mL  Liters Processed 80.2  Fluid Removed (mL) 2000 mL  Hemodialysis Catheter Right Internal jugular Double lumen Permanent (Tunneled)  Placement Date/Time: 09/07/22 1352   Placed prior to admission: No  Serial / Lot #: [id number]  Expiration Date: 03/10/27  Time Out: Correct patient;Correct site;Correct procedure  Maximum sterile barrier precautions: Hand hygiene;Cap;Mask;Large steri...  Site Condition No complications  Blue Lumen Status Flushed;Dead end cap in place;Heparin locked  Red Lumen Status Flushed;Dead end cap in place;Heparin locked  Purple Lumen Status N/A  Catheter fill solution Heparin 1000 units/ml  Catheter fill volume (Arterial) 1.6 cc  Catheter fill volume (Venous) 1.6  Dressing Type Transparent  Dressing Status Antimicrobial disc in place;Clean, Dry, Intact  Drainage Description None  Dressing Change Due 10/06/22  Post treatment catheter status Capped and Clamped   Received patient in bed to unit.  Alert and oriented.  Informed consent signed and in chart.   TX duration:3.5   Patient tolerated well.  Transported back to the room  Alert, without acute distress.  Hand-off given to patient's nurse.  Floor RN aware of wound vac continuous alarming seal leak, attempted to reinforce dressing without success. Wound vac off during tx and turned back on at the end of tx.   Access used: R Cath Access issues: none  Total UF removed: 2000 ml Medication(s) given: Dilaudud  1 mg IV , Oxy 10mg  PO, Heparin cath locks  Post HD VS: see attached  Post HD  weight: UTW   [facility]

## 2022-10-03 NOTE — Progress Notes (Signed)
Progress Note    Albert Harris   ZOX:[id number]  DOB: 25-Mar-1956  DOA: 08/28/2022     36 PCP: [name], NP  Initial CC: abd pain, N/V  Hospital Course: 67 year old male with history of ESRD on HD TTS, PVD,COPD, essential hypertension, chronic systolic congestive heart failure with ejection fraction 35 to 40%, presented with abdominal pain nausea and diaphoresis. Patient was also hypotensive with a lactic acidosis and was admitted to ICU,with a pulseless left lower extremity was found to have femoral pseudoaneurysm.Culture came back with MRSA bacteremia-TEE neg for vegetations.  08/29/22: -Admitted to ICU-on vasopressors for septic shock -s/p OR with Dr. Karin Lieu for left femoral exploration with pseudoaneurysm repair,hematoma evacuation>showed dehiscence at proximal aspect of bovine pericardial patch at the suture line.  -Emergent CRRT for severe hyperkalemia 09/08/22 Transferred to [facility] service  09/09/22 CTA for bleeding on left groin: showed Thrombosis of LEFT CFA pseudoaneurysm with new 3.5 cm filling defect, consistent with arterial thrombus, extending from the PSA neck and into the distal CFA. This is at risk for distal embolization and potential acute LEFT lower extremity ischemia.New 8 mm distal LEFT EIA pseudoaneurysm, medial to the stent distal landing zone. Mild interval enlargement of retroperitoneal hematoma, in a craniocaudal dimension measuring 12.2 cm (previously 11.2 cm).No CTA evidence of active extravasation. Patent RIGHT femoral-to-popliteal graft and bilateral iliac stents.Severe burden of splanchnic arterial atherosclerosis, at risk for chronic mesenteric ischemia. 09/09/22: urgent OR:S/P left common iliac to profunda femoris bypass with ipsilateral nonreversed greater saphenous vein in anatomic tunnel, left common iliac artery angioplasty and stenting, left greater saphenous vein harvest, left sartorius muscle of left groin, left lower extremity angiography with  first-order cannulation Transferred to 2H post op 09/12/22 am: 2 BRBPR> s/p Flex sig>showed 5 rectal ulcers, not actively bleeding- stool brown.advised to avoid constipation, added Miralax BID 09/13/22 AM: again 2 large bloody BM ~ 500cc> got 1 unit PRBC  Interval History:   Only got 90 cc blood yesterday in HD? So not surprising Hgb small improvement from 6.9 >> 7.1 g/dL this morning.  Also had another blunt discussion with patient privately in room and then daughter and his wife in hallway. Unfortunately treatments are proving to be futile (transfusion dependent) and ongoing gangrene/necrosis of left toes. Became hypotensive with HD yesterday too.  Discussed with patient that medical measures are now to be pretty futile as the trend has unfortunately proven itself over these past few weeks. His daughter and wife do seem to understand the poor prognosis and I think patient deep down does but he is clinging to any chance of hope/recovery or stability.   I've also re-engaged palliative and they will plan to see either today or over the weekend.   Assessment and Plan:  Acute blood loss anemia from bleeding anal/rectal ulcers possibly from his Crohn's disease - Patient underwent flex sigmoidoscopy on 4/6 was found to have 5 nonbleeding ulcers.  He also underwent colonoscopy which showed multiple rectal ulcers with visible bleeding underwent hemostasis. - Patient developed massive rectal bleeding again on 4/18 - Patient is not a candidate for IR intervention due to severe peripheral vascular disease and anatomy - General surgery on board, was taken to the OR  on 4/20 and underwent S/p EUA and over sewing of ulcers and packing anal canal with gelfoam by Dr. Magnus Ivan. - continue budesonide - Per last surgical notes there may be no options for intervention should the patient have any hemodynamically significant bleeding.  See surgical note from Dr.  Magnus Ivan 4/21.  Poor prognosis.  Strongly recommend DNR  status. -Remains transfusion dependent and medical care now strongly being considered futile especially with even ongoing dialysis as patient now likely to be bedridden and progressive poor quality of life/functional status  - patient plans to discuss GOC further with family over this weekend  PVD  Left common femoral artery pseudoaneurysm S/p left groin hematoma evacuation, primary repair of the distal external iliac artery. Further complicated on 4/3 by left groin bleeding with new pseudoaneurysm and arterial thrombus. S/p iliofemoral bypass and removal of infected patch with sartorius muscle flap. Further management as per vascular surgery.  - Continue with wound VAC - given his prognosis and current state, doubt would even heal well from amputation    MRSA bacteremia secondary to postop wound infection with MRSA and Enterococcus faecium. Infectious disease note from 4/8 recommending daptomycin for 6 weeks followed by indefinite oral suppression of antibiotics. Monitor CPK weekly TEE is negative for vegetations. Patient will need outpatient follow-up with infectious disease to determine oral antibiotics. - continue daptomycin for now; Last dose of IV antibiotics on 10/23/22   ESRD on HD Nephrology on board and dialysis via right IJ tunneled catheter. Further HD sessions as per nephrology - I have recommended patient consider discontinuation of dialysis in setting of overall poor prognosis in context of his overall clinical situation; palliative care to help facilitate further also   Chronic systolic heart failure Last echocardiogram from January 2024 showed left ventricular ejection fraction of 35 to 40% Pulmonary hypertension Cannot restart antiplatelet agents at this time Fluid management as per dialysis.   Acute metabolic encephalopathy  - from uremia, acidosis and acute 4 mm right subinsular stroke   Retroperitoneal hemorrhage Appears to be Stable    Moderate  malnutrition - continue diet   Hypertension:  On metoprolol. Suspect a component of pain.  Continue with hydralazine as needed   COPD:  No wheezing heard.  On RA  Old records reviewed in assessment of this patient   DVT prophylaxis:  SCD's Start: 09/09/22 1841   Code Status:   Code Status: Full Code  Mobility Assessment (last 72 hours)     Mobility Assessment     Row Name 10/02/22 2145 10/02/22 1600 10/02/22 1400 10/02/22 0824 10/02/22 0358   Does patient have an order for bedrest or is patient medically unstable No - Continue assessment -- -- No - Continue assessment Yes- Bedfast (Level 1) - Complete   What is the highest level of mobility based on the progressive mobility assessment? Level 2 (Chairfast) - Balance while sitting on edge of bed and cannot stand Level 2 (Chairfast) - Balance while sitting on edge of bed and cannot stand Level 2 (Chairfast) - Balance while sitting on edge of bed and cannot stand Level 2 (Chairfast) - Balance while sitting on edge of bed and cannot stand Level 1 (Bedfast) - Unable to balance while sitting on edge of bed   Is the above level different from baseline mobility prior to current illness? Yes - Recommend PT order -- -- Yes - Recommend PT order Yes - Recommend PT order    Row Name 10/01/22 2323 10/01/22 0830 10/01/22 0444 09/30/22 2339 09/30/22 1935   Does patient have an order for bedrest or is patient medically unstable Yes- Bedfast (Level 1) - Complete No - Continue assessment Yes- Bedfast (Level 1) - Complete Yes- Bedfast (Level 1) - Complete Yes- Bedfast (Level 1) - Complete   What is the highest  level of mobility based on the progressive mobility assessment? Level 1 (Bedfast) - Unable to balance while sitting on edge of bed Level 2 (Chairfast) - Balance while sitting on edge of bed and cannot stand Level 1 (Bedfast) - Unable to balance while sitting on edge of bed Level 1 (Bedfast) - Unable to balance while sitting on edge of bed Level 1  (Bedfast) - Unable to balance while sitting on edge of bed   Is the above level different from baseline mobility prior to current illness? Yes - Recommend PT order Yes - Recommend PT order Yes - Recommend PT order Yes - Recommend PT order Yes - Recommend PT order            Barriers to discharge:  Disposition Plan:  Home Status is: Inpt  Objective: Blood pressure (!) 146/63, pulse 83, temperature 98.3 F (36.8 C), temperature source Oral, resp. rate 16, height 5' 7.99" (1.727 m), weight 85 kg, SpO2 97 %.  Examination:  Physical Exam Constitutional:      General: He is not in acute distress.    Appearance: Normal appearance. He is well-developed.  HENT:     Head: Normocephalic and atraumatic.     Mouth/Throat:     Mouth: Mucous membranes are moist.  Eyes:     Extraocular Movements: Extraocular movements intact.  Cardiovascular:     Rate and Rhythm: Normal rate and regular rhythm.     Heart sounds: Normal heart sounds.  Pulmonary:     Effort: Pulmonary effort is normal. No respiratory distress.     Breath sounds: Normal breath sounds. No wheezing.  Abdominal:     General: Bowel sounds are normal. There is no distension.     Palpations: Abdomen is soft.     Tenderness: There is no abdominal tenderness.  Genitourinary:    Comments: Left groin WV in place Musculoskeletal:        General: Normal range of motion.     Cervical back: Normal range of motion and neck supple.     Comments: Gangrenous left toes  Skin:    General: Skin is warm and dry.  Neurological:     General: No focal deficit present.     Mental Status: He is alert.  Psychiatric:        Mood and Affect: Mood normal.        Behavior: Behavior normal.      Consultants:  Nephrology Palliative care General surgery Vascular surgery  Procedures:    Data Reviewed: Results for orders placed or performed during the hospital encounter of 08/28/22 (from the past 24 hour(s))  Hemoglobin and hematocrit,  blood     Status: Abnormal   Collection Time: 10/02/22  5:00 PM  Result Value Ref Range   Hemoglobin 7.3 (L) 13.0 - 17.0 g/dL   HCT 16.1 (L) 09.6 - 04.5 %  Renal function panel (daily at 0500)     Status: Abnormal   Collection Time: 10/03/22  5:12 AM  Result Value Ref Range   Sodium 139 135 - 145 mmol/L   Potassium 5.2 (H) 3.5 - 5.1 mmol/L   Chloride 100 98 - 111 mmol/L   CO2 25 22 - 32 mmol/L   Glucose, Bld 90 70 - 99 mg/dL   BUN 75 (H) 8 - 23 mg/dL   Creatinine, Ser 4.09 (H) 0.61 - 1.24 mg/dL   Calcium 8.4 (L) 8.9 - 10.3 mg/dL   Phosphorus 5.8 (H) 2.5 - 4.6 mg/dL   Albumin 2.0 (  L) 3.5 - 5.0 g/dL   GFR, Estimated 10 (L) >60 mL/min   Anion gap 14 5 - 15  Hemoglobin and hematocrit, blood     Status: Abnormal   Collection Time: 10/03/22  5:12 AM  Result Value Ref Range   Hemoglobin 7.5 (L) 13.0 - 17.0 g/dL   HCT 11.9 (L) 14.7 - 82.9 %    I have reviewed pertinent nursing notes, vitals, labs, and images as necessary. I have ordered labwork to follow up on as indicated.  I have reviewed the last notes from staff over past 24 hours. I have discussed patient's care plan and test results with nursing staff, CM/SW, and other staff as appropriate.  Time spent: Greater than 50% of the 55 minute visit was spent in counseling/coordination of care for the patient as laid out in the A&P.   LOS: 36 days   Lewie Chamber, MD [facility] 10/03/2022, 4:32 PM

## 2022-10-03 NOTE — Progress Notes (Signed)
[facility] Progress Note   Subjective:    Seen in HD-  no new c/o's-  hgb pretty stable -  pt says he has plans to speak with his family today re goc-  appreciate palliative assist  Objective Vitals:   10/03/22 0611 10/03/22 0828 10/03/22 0842 10/03/22 0900  BP: (!) 178/59 (!) 145/83 (!) 156/66 (!) 186/81  Pulse: 76 69 73 72  Resp: 18 18 19 18   Temp:  98 F (36.7 C)    TempSrc:  Oral    SpO2: 100% 100%  99%  Weight:      Height:       Physical Exam General: Awake, alert,  On RA, NAD Heart: RRR; no murmur Lungs: CTA anteriorly Abdomen: soft Extremities: dep edema; L foot with ischemic distal 3rd-5th toes Dialysis Access: [facility]  Filed Weights   10/01/22 1351 10/01/22 1549 10/02/22 0500  Weight: 84 kg 82.5 kg 85 kg    Intake/Output Summary (Last 24 hours) at 10/03/2022 0911 Last data filed at 10/02/2022 1915 Gross per 24 hour  Intake 240 ml  Output 1 ml  Net 239 ml    Additional Objective Labs: Basic Metabolic Panel: Recent Labs  Lab 10/01/22 1224 10/02/22 0539 10/03/22 0512  NA 138 136 139  K 5.0 4.2 5.2*  CL 100 98 100  CO2 26 27 25   GLUCOSE 112* 91 90  BUN 75* 46* 75*  CREATININE 6.30* 4.30* 5.67*  CALCIUM 8.0* 8.1* 8.4*  PHOS 5.3* 3.7 5.8*   Liver Function Tests: Recent Labs  Lab 10/01/22 1224 10/02/22 0539 10/03/22 0512  ALBUMIN <1.5* 2.0* 2.0*   No results for input(s): "LIPASE", "AMYLASE" in the last 168 hours. CBC: Recent Labs  Lab 09/28/22 0340 09/29/22 0957 09/30/22 0803 09/30/22 1331 10/01/22 1224 10/02/22 0539 10/02/22 1700 10/03/22 0512  WBC 12.8* 12.3* 13.6*  --  15.1*  --   --   --   NEUTROABS 10.7* 10.3* 10.6*  --   --   --   --   --   HGB 8.1* 8.8* 8.7*   < > 6.9* 7.1* 7.3* 7.5*  HCT 24.9* 26.4* 26.6*   < > 21.8* 22.2* 23.0* 23.6*  MCV 94.3 94.6 96.0  --  98.2  --   --   --   PLT 159 198 211  --  210  --   --   --    < > = values in this interval not displayed.   Blood Culture    Component Value  Date/Time   SDES TISSUE 09/09/2022 1453   SPECREQUEST VESSEL PT ON VANC ANCEF 09/09/2022 1453   CULT  09/09/2022 1453    RARE STAPHYLOCOCCUS AUREUS SUSCEPTIBILITIES PERFORMED ON PREVIOUS CULTURE WITHIN THE LAST 5 DAYS. NO ANAEROBES ISOLATED Performed at [facility], [address] [place], [place] [address]    REPTSTATUS 09/14/2022 FINAL 09/09/2022 1453    Cardiac Enzymes: Recent Labs  Lab 09/28/22 0340  CKTOTAL 18*   CBG: Recent Labs  Lab 09/28/22 1617  GLUCAP 99   Iron Studies: No results for input(s): "IRON", "TIBC", "TRANSFERRIN", "FERRITIN" in the last 72 hours. Lab Results  Component Value Date   INR 1.2 08/12/2022   INR 1.3 (H) 06/29/2022   INR 1.3 (H) 06/28/2022   Studies/Results: No results found.  Medications:  sodium chloride 0 mL/hr at 09/22/22 0757   DAPTOmycin (CUBICIN) 600 mg in sodium chloride 0.9 % IVPB 600 mg (10/01/22 1812)   DAPTOmycin (CUBICIN) 750 mg  in sodium chloride 0.9 % IVPB Stopped (09/26/22 1731)   sodium chloride      (feeding supplement) PROSource Plus  30 mL Oral TID WC   sodium chloride   Intravenous Once   sodium chloride   Intravenous Once   acetaminophen  650 mg Oral Q6H   ascorbic acid  250 mg Oral BID   budesonide  9 mg Oral Daily   calcium acetate  1,334 mg Oral TID WC   Chlorhexidine Gluconate Cloth  6 each Topical Q0600   Chlorhexidine Gluconate Cloth  6 each Topical Q0600   Chlorhexidine Gluconate Cloth  6 each Topical Q0600   collagenase   Topical Daily   darbepoetin (ARANESP) injection - DIALYSIS  200 mcg Subcutaneous Q Wed-1800   hydrOXYzine  10 mg Oral Once   liver oil-zinc oxide   Topical TID   methocarbamol  500 mg Oral TID   metoprolol succinate  25 mg Oral Daily   multivitamin  1 tablet Oral QHS   nutrition supplement (JUVEN)  1 packet Oral BID BM   pantoprazole  40 mg Oral BID   polyethylene glycol  17 g Oral Daily   rosuvastatin  10 mg Oral Daily   sodium chloride flush  10-40 mL Intracatheter  Q12H   vitamin A  10,000 Units Oral Daily    Dialysis Orders: TTS SW  4h   350/800  64.5kg  RFA AVG -> clotted/ new TDC in place Heparin none  - last OP HD 3/14, post wt 65.9kg - venofer 50mg  IV weekly - rocaltrol 0.25 mcg po tiw - phoslo 2 ac tid - no esa, last Hb 11.0  Assessment/Plan: Recurrent lower GIB: S/p colonoscopy 09/15/22, then repeat sigmoidoscopy 4/11. Several bleeding ulcers addressed.  Path came back + Crohn's - started on mesalamine by GI. Brisk re-bleed 4/18, back for emergent sigmoidoscopy with hemostatic gel applied to bleeding ulcer. Patient started bleeding again from his rectum/ anus -s/p suturing of bleeding ulcers 4/20 by Dr. Magnus Ivan-  this continues to be a problem but hgb stable the last 24 hours Left common femoral artery pseudoaneurysm - s/p L femoral exploration with pseudoaneurysm repair 08/29/22, then iliofemoral bypass, removal of infected patch with sartorius muscle flap on 09/09/22. Ischemic changes to L 3rd-5th toes - still high risk of limb loss.   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  CVA: + stroke on MRI. Neuro signed off.  ESRD: Continue HD on TTS schedule, did require short-term CRRT 3/23- 3/26. Now on IHD. Next HD 4/27 /today per his usual schedule. BP/volume: Variable, 1+ edema on exam. Push UF as tolerated Anemia (ESRD + ABLA): Last transfused 1U on 4/25 (that brings total to 25U this admit). Also on Aranesp q Wed (max dose). Staying pretty stable  Secondary HPTH: CorrCa high but now at goal, Phos ok. Continue holding VDRA , continue phoslo for now. Dialysis access: AVG clotted 3/27, went for declot by VVS and then re-clotted. VVS placed R TDC on 4/1.  Albumin less than 1.5-  30 + days of hosp today -  have asked palliative care to re engage-  daughter seems to get it but pt does not.  Would be appropriate to be at least DNR  Cecille Aver  [facility] 10/03/2022,9:11 AM  LOS: 36 days

## 2022-10-03 NOTE — Procedures (Signed)
Patient was seen on dialysis and the procedure was supervised.  BFR 400  Via TDC BP is  174/73.   Patient appears to be tolerating treatment well  Albert Harris 10/03/2022

## 2022-10-04 DIAGNOSIS — R1084 Generalized abdominal pain: Secondary | ICD-10-CM

## 2022-10-04 DIAGNOSIS — Z7189 Other specified counseling: Secondary | ICD-10-CM | POA: Diagnosis not present

## 2022-10-04 DIAGNOSIS — K922 Gastrointestinal hemorrhage, unspecified: Secondary | ICD-10-CM | POA: Diagnosis not present

## 2022-10-04 DIAGNOSIS — N186 End stage renal disease: Secondary | ICD-10-CM | POA: Diagnosis not present

## 2022-10-04 DIAGNOSIS — Z515 Encounter for palliative care: Secondary | ICD-10-CM | POA: Diagnosis not present

## 2022-10-04 DIAGNOSIS — A419 Sepsis, unspecified organism: Secondary | ICD-10-CM | POA: Diagnosis not present

## 2022-10-04 DIAGNOSIS — D62 Acute posthemorrhagic anemia: Secondary | ICD-10-CM | POA: Diagnosis not present

## 2022-10-04 LAB — HEMOGLOBIN AND HEMATOCRIT, BLOOD
HCT: 23.8 % — ABNORMAL LOW (ref 39.0–52.0)
HCT: 24.6 % — ABNORMAL LOW (ref 39.0–52.0)
Hemoglobin: 7.9 g/dL — ABNORMAL LOW (ref 13.0–17.0)
Hemoglobin: 7.9 g/dL — ABNORMAL LOW (ref 13.0–17.0)

## 2022-10-04 LAB — RENAL FUNCTION PANEL
Albumin: 1.9 g/dL — ABNORMAL LOW (ref 3.5–5.0)
Anion gap: 14 (ref 5–15)
BUN: 41 mg/dL — ABNORMAL HIGH (ref 8–23)
CO2: 26 mmol/L (ref 22–32)
Calcium: 8.3 mg/dL — ABNORMAL LOW (ref 8.9–10.3)
Chloride: 95 mmol/L — ABNORMAL LOW (ref 98–111)
Creatinine, Ser: 3.82 mg/dL — ABNORMAL HIGH (ref 0.61–1.24)
GFR, Estimated: 16 mL/min — ABNORMAL LOW (ref >60)
Glucose, Bld: 87 mg/dL (ref 70–99)
Phosphorus: 5.3 mg/dL — ABNORMAL HIGH (ref 2.5–4.6)
Potassium: 4.2 mmol/L (ref 3.5–5.1)
Sodium: 135 mmol/L (ref 135–145)

## 2022-10-04 NOTE — Progress Notes (Signed)
[facility] Progress Note   Subjective:    HD yest-  removed 2000-  tolerated well -- hgb pretty stable  Objective Vitals:   10/03/22 1627 10/03/22 1948 10/04/22 0000 10/04/22 0331  BP: (!) 146/63 133/77 (!) 142/74   Pulse: 83 77 76   Resp: 16 15 15    Temp: 98.3 F (36.8 C) 98.2 F (36.8 C) 98.2 F (36.8 C) 97.9 F (36.6 C)  TempSrc: Oral Oral Oral Oral  SpO2: 97% 97% 97%   Weight:      Height:       Physical Exam General: Awake, alert,  On RA, NAD Heart: RRR; no murmur Lungs: CTA anteriorly Abdomen: soft Extremities: dep edema; L foot with ischemic distal 3rd-5th toes Dialysis Access: [facility]  Filed Weights   10/01/22 1351 10/01/22 1549 10/02/22 0500  Weight: 84 kg 82.5 kg 85 kg    Intake/Output Summary (Last 24 hours) at 10/04/2022 0747 Last data filed at 10/04/2022 0323 Gross per 24 hour  Intake 70.25 ml  Output 2000 ml  Net -1929.75 ml    Additional Objective Labs: Basic Metabolic Panel: Recent Labs  Lab 10/03/22 0512 10/03/22 1454 10/04/22 0448  NA 139 136 135  K 5.2* 3.6 4.2  CL 100 97* 95*  CO2 25 29 26   GLUCOSE 90 90 87  BUN 75* 28* 41*  CREATININE 5.67* 2.88* 3.82*  CALCIUM 8.4* 8.0* 8.3*  PHOS 5.8* 2.9 5.3*   Liver Function Tests: Recent Labs  Lab 10/03/22 0512 10/03/22 1454 10/04/22 0448  ALBUMIN 2.0* 2.0* 1.9*   No results for input(s): "LIPASE", "AMYLASE" in the last 168 hours. CBC: Recent Labs  Lab 09/28/22 0340 09/29/22 0957 09/30/22 0803 09/30/22 1331 10/01/22 1224 10/02/22 0539 10/03/22 0512 10/03/22 1600 10/04/22 0448  WBC 12.8* 12.3* 13.6*  --  15.1*  --   --  18.2*  --   NEUTROABS 10.7* 10.3* 10.6*  --   --   --   --   --   --   HGB 8.1* 8.8* 8.7*   < > 6.9*   < > 7.5* 7.7* 7.9*  HCT 24.9* 26.4* 26.6*   < > 21.8*   < > 23.6* 24.2* 23.8*  MCV 94.3 94.6 96.0  --  98.2  --   --  96.4  --   PLT 159 198 211  --  210  --   --  212  --    < > = values in this interval not displayed.   Blood Culture     Component Value Date/Time   SDES TISSUE 09/09/2022 1453   SPECREQUEST VESSEL PT ON VANC ANCEF 09/09/2022 1453   CULT  09/09/2022 1453    RARE STAPHYLOCOCCUS AUREUS SUSCEPTIBILITIES PERFORMED ON PREVIOUS CULTURE WITHIN THE LAST 5 DAYS. NO ANAEROBES ISOLATED Performed at [facility], [address] [place], [place] [address]    REPTSTATUS 09/14/2022 FINAL 09/09/2022 1453    Cardiac Enzymes: Recent Labs  Lab 09/28/22 0340  CKTOTAL 18*   CBG: Recent Labs  Lab 09/28/22 1617  GLUCAP 99   Iron Studies: No results for input(s): "IRON", "TIBC", "TRANSFERRIN", "FERRITIN" in the last 72 hours. Lab Results  Component Value Date   INR 1.2 08/12/2022   INR 1.3 (H) 06/29/2022   INR 1.3 (H) 06/28/2022   Studies/Results: No results found.  Medications:  sodium chloride 0 mL/hr at 09/22/22 0757   DAPTOmycin (CUBICIN) 600 mg in sodium chloride 0.9 % IVPB 600 mg (10/01/22 1812)  DAPTOmycin (CUBICIN) 750 mg in sodium chloride 0.9 % IVPB Stopped (10/03/22 1935)   sodium chloride      (feeding supplement) PROSource Plus  30 mL Oral TID WC   sodium chloride   Intravenous Once   sodium chloride   Intravenous Once   acetaminophen  650 mg Oral Q6H   ascorbic acid  250 mg Oral BID   budesonide  9 mg Oral Daily   calcium acetate  1,334 mg Oral TID WC   Chlorhexidine Gluconate Cloth  6 each Topical Q0600   collagenase   Topical Daily   darbepoetin (ARANESP) injection - DIALYSIS  200 mcg Subcutaneous Q Wed-1800   liver oil-zinc oxide   Topical TID   methocarbamol  500 mg Oral TID   metoprolol succinate  25 mg Oral Daily   multivitamin  1 tablet Oral QHS   nutrition supplement (JUVEN)  1 packet Oral BID BM   pantoprazole  40 mg Oral BID   polyethylene glycol  17 g Oral Daily   rosuvastatin  10 mg Oral Daily   sodium chloride flush  10-40 mL Intracatheter Q12H   vitamin A  10,000 Units Oral Daily    Dialysis Orders: TTS SW  4h   350/800  64.5kg  RFA AVG -> clotted/ new TDC  in place Heparin none  - last OP HD 3/14, post wt 65.9kg - venofer 50mg  IV weekly - rocaltrol 0.25 mcg po tiw - phoslo 2 ac tid - no esa, last Hb 11.0  Assessment/Plan: Recurrent lower GIB: S/p colonoscopy 09/15/22, then repeat sigmoidoscopy 4/11. Several bleeding ulcers addressed.  Path came back + Crohn's - started on mesalamine by GI. Brisk re-bleed 4/18, back for emergent sigmoidoscopy with hemostatic gel applied to bleeding ulcer. Patient started bleeding again from his rectum/ anus -s/p suturing of bleeding ulcers 4/20 by Dr. Magnus Ivan-  this continues to be a problem but hgb stable the last 48 hours Left common femoral artery pseudoaneurysm - s/p L femoral exploration with pseudoaneurysm repair 08/29/22, then iliofemoral bypass, removal of infected patch with sartorius muscle flap on 09/09/22. Ischemic changes to L 3rd-5th toes - still high risk of limb loss.   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  CVA: + stroke on MRI. Neuro signed off.  ESRD: Continue HD on TTS schedule, did require short-term CRRT 3/23- 3/26. Now on IHD. Next HD 4/30  per his usual schedule. BP/volume: Variable, 1+ edema on exam. Push UF as tolerated Anemia (ESRD + ABLA): Last transfused 1U on 4/25 (that brings total to 25U this admit). Also on Aranesp q Wed (max dose). Staying pretty stable  Secondary HPTH: CorrCa high but now at goal, Phos ok. Continue holding VDRA , continue phoslo for now. Dialysis access: AVG clotted 3/27, went for declot by VVS and then re-clotted. VVS placed R TDC on 4/1.  Albumin less than 1.5-  30 + days of hosp today -  have asked palliative care to re engage-  daughter seems to get it but pt does not.  Would be appropriate to be at least DNR-  we are reaching futility point here-  discussions ongoing  Cecille Aver  [facility] 10/04/2022,7:47 AM  LOS: 37  days

## 2022-10-04 NOTE — Progress Notes (Signed)
Progress Note    Albert Harris   ZOX:[id number]  DOB: 09/26/1955  DOA: 08/28/2022     35 PCP: [name], NP  Initial CC: abd pain, N/V  Hospital Course: 67 year old male with history of ESRD on HD TTS, PVD,COPD, essential hypertension, chronic systolic congestive heart failure with ejection fraction 35 to 40%, presented with abdominal pain nausea and diaphoresis. Patient was also hypotensive with a lactic acidosis and was admitted to ICU,with a pulseless left lower extremity was found to have femoral pseudoaneurysm.Culture came back with MRSA bacteremia-TEE neg for vegetations.  08/29/22: -Admitted to ICU-on vasopressors for septic shock -s/p OR with Dr. Karin Lieu for left femoral exploration with pseudoaneurysm repair,hematoma evacuation>showed dehiscence at proximal aspect of bovine pericardial patch at the suture line.  -Emergent CRRT for severe hyperkalemia 09/08/22 Transferred to [facility] service  09/09/22 CTA for bleeding on left groin: showed Thrombosis of LEFT CFA pseudoaneurysm with new 3.5 cm filling defect, consistent with arterial thrombus, extending from the PSA neck and into the distal CFA. This is at risk for distal embolization and potential acute LEFT lower extremity ischemia.New 8 mm distal LEFT EIA pseudoaneurysm, medial to the stent distal landing zone. Mild interval enlargement of retroperitoneal hematoma, in a craniocaudal dimension measuring 12.2 cm (previously 11.2 cm).No CTA evidence of active extravasation. Patent RIGHT femoral-to-popliteal graft and bilateral iliac stents.Severe burden of splanchnic arterial atherosclerosis, at risk for chronic mesenteric ischemia. 09/09/22: urgent OR:S/P left common iliac to profunda femoris bypass with ipsilateral nonreversed greater saphenous vein in anatomic tunnel, left common iliac artery angioplasty and stenting, left greater saphenous vein harvest, left sartorius muscle of left groin, left lower extremity angiography with  first-order cannulation Transferred to 2H post op 09/12/22 am: 2 BRBPR> s/p Flex sig>showed 5 rectal ulcers, not actively bleeding- stool brown.advised to avoid constipation, added Miralax BID 09/13/22 AM: again 2 large bloody BM ~ 500cc> got 1 unit PRBC  Interval History:   Notes overnight.  Patient stated he was ready to go home but after talking about plan upon going home, the conversation essentially went in a circle.  He says he would continue dialysis and when I asked him what he would do if needing more blood, he essentially stated he would want a transfusion.  Therefore, told him it would not be worthwhile for discharging at this time as he would likely end up readmitted soon after once his hemoglobin downtrends as expected given his hemoglobin trended thus far over the past few weeks.  He seems to have a better understanding of his prognosis but is still largely not ready for any de-escalation of care.  Assessment and Plan:  Acute blood loss anemia from bleeding anal/rectal ulcers possibly from his Crohn's disease - Patient underwent flex sigmoidoscopy on 4/6 was found to have 5 nonbleeding ulcers.  He also underwent colonoscopy which showed multiple rectal ulcers with visible bleeding underwent hemostasis. - Patient developed massive rectal bleeding again on 4/18 - Patient is not a candidate for IR intervention due to severe peripheral vascular disease and anatomy - General surgery on board, was taken to the OR  on 4/20 and underwent S/p EUA and over sewing of ulcers and packing anal canal with gelfoam by Dr. Magnus Ivan. - continue budesonide - Per last surgical notes there may be no options for intervention should the patient have any hemodynamically significant bleeding.  See surgical note from Dr. Magnus Ivan 4/21.  Poor prognosis.  Strongly recommend DNR status. -Remains transfusion dependent and medical care now strongly  being considered futile especially with even ongoing dialysis as  patient now likely to be bedridden and progressive poor quality of life/functional status  - patient plans to discuss GOC further with family over this weekend; patient still too hesitant for transitioning to hospice  PVD  Left common femoral artery pseudoaneurysm S/p left groin hematoma evacuation, primary repair of the distal external iliac artery. Further complicated on 4/3 by left groin bleeding with new pseudoaneurysm and arterial thrombus. S/p iliofemoral bypass and removal of infected patch with sartorius muscle flap. Further management as per vascular surgery.  - Continue with wound VAC - given his prognosis and current state, doubt would even heal well from amputation    MRSA bacteremia secondary to postop wound infection with MRSA and Enterococcus faecium. Infectious disease note from 4/8 recommending daptomycin for 6 weeks followed by indefinite oral suppression of antibiotics. Monitor CPK weekly TEE is negative for vegetations. Patient will need outpatient follow-up with infectious disease to determine oral antibiotics. - continue daptomycin for now; Last dose of IV antibiotics on 10/23/22   ESRD on HD Nephrology on board and dialysis via right IJ tunneled catheter. Further HD sessions as per nephrology - I have recommended patient consider discontinuation of dialysis in setting of overall poor prognosis in context of his overall clinical situation; palliative care to help facilitate further also - unfortunately, he is not ready to make this decision yet but the conversations will continue   Chronic systolic heart failure Last echocardiogram from January 2024 showed left ventricular ejection fraction of 35 to 40% Pulmonary hypertension Cannot restart antiplatelet agents at this time Fluid management as per dialysis.   Acute metabolic encephalopathy  - from uremia, acidosis and acute 4 mm right subinsular stroke   Retroperitoneal hemorrhage Appears to be Stable     Moderate malnutrition - continue diet   Hypertension:  On metoprolol. Suspect a component of pain.  Continue with hydralazine as needed   COPD:  No wheezing heard.  On RA  Old records reviewed in assessment of this patient   DVT prophylaxis:  SCD's Start: 09/09/22 1841   Code Status:   Code Status: Full Code  Mobility Assessment (last 72 hours)     Mobility Assessment     Row Name 10/03/22 2000 10/02/22 2145 10/02/22 1600 10/02/22 1400 10/02/22 0824   Does patient have an order for bedrest or is patient medically unstable No - Continue assessment No - Continue assessment -- -- No - Continue assessment   What is the highest level of mobility based on the progressive mobility assessment? Level 2 (Chairfast) - Balance while sitting on edge of bed and cannot stand Level 2 (Chairfast) - Balance while sitting on edge of bed and cannot stand Level 2 (Chairfast) - Balance while sitting on edge of bed and cannot stand Level 2 (Chairfast) - Balance while sitting on edge of bed and cannot stand Level 2 (Chairfast) - Balance while sitting on edge of bed and cannot stand   Is the above level different from baseline mobility prior to current illness? Yes - Recommend PT order Yes - Recommend PT order -- -- Yes - Recommend PT order    Row Name 10/02/22 0358 10/01/22 2323         Does patient have an order for bedrest or is patient medically unstable Yes- Bedfast (Level 1) - Complete Yes- Bedfast (Level 1) - Complete      What is the highest level of mobility based on the progressive mobility  assessment? Level 1 (Bedfast) - Unable to balance while sitting on edge of bed Level 1 (Bedfast) - Unable to balance while sitting on edge of bed      Is the above level different from baseline mobility prior to current illness? Yes - Recommend PT order Yes - Recommend PT order               Barriers to discharge:  Disposition Plan:  Home Status is: Inpt  Objective: Blood pressure (!) 155/69,  pulse 87, temperature 98.4 F (36.9 C), temperature source Oral, resp. rate 20, height 5' 7.99" (1.727 m), weight 85 kg, SpO2 100 %.  Examination:  Physical Exam Constitutional:      General: He is not in acute distress.    Appearance: Normal appearance. He is well-developed.  HENT:     Head: Normocephalic and atraumatic.     Mouth/Throat:     Mouth: Mucous membranes are moist.  Eyes:     Extraocular Movements: Extraocular movements intact.  Cardiovascular:     Rate and Rhythm: Normal rate and regular rhythm.     Heart sounds: Normal heart sounds.  Pulmonary:     Effort: Pulmonary effort is normal. No respiratory distress.     Breath sounds: Normal breath sounds. No wheezing.  Abdominal:     General: Bowel sounds are normal. There is no distension.     Palpations: Abdomen is soft.     Tenderness: There is no abdominal tenderness.  Genitourinary:    Comments: Left groin WV in place Musculoskeletal:        General: Normal range of motion.     Cervical back: Normal range of motion and neck supple.     Comments: Gangrenous left toes  Skin:    General: Skin is warm and dry.  Neurological:     General: No focal deficit present.     Mental Status: He is alert.  Psychiatric:        Mood and Affect: Mood normal.        Behavior: Behavior normal.      Consultants:  Nephrology Palliative care General surgery Vascular surgery  Procedures:    Data Reviewed: Results for orders placed or performed during the hospital encounter of 08/28/22 (from the past 24 hour(s))  Renal function panel     Status: Abnormal   Collection Time: 10/03/22  2:54 PM  Result Value Ref Range   Sodium 136 135 - 145 mmol/L   Potassium 3.6 3.5 - 5.1 mmol/L   Chloride 97 (L) 98 - 111 mmol/L   CO2 29 22 - 32 mmol/L   Glucose, Bld 90 70 - 99 mg/dL   BUN 28 (H) 8 - 23 mg/dL   Creatinine, Ser 1.61 (H) 0.61 - 1.24 mg/dL   Calcium 8.0 (L) 8.9 - 10.3 mg/dL   Phosphorus 2.9 2.5 - 4.6 mg/dL   Albumin 2.0  (L) 3.5 - 5.0 g/dL   GFR, Estimated 23 (L) >60 mL/min   Anion gap 10 5 - 15  CBC     Status: Abnormal   Collection Time: 10/03/22  4:00 PM  Result Value Ref Range   WBC 18.2 (H) 4.0 - 10.5 K/uL   RBC 2.51 (L) 4.22 - 5.81 MIL/uL   Hemoglobin 7.7 (L) 13.0 - 17.0 g/dL   HCT 09.6 (L) 04.5 - 40.9 %   MCV 96.4 80.0 - 100.0 fL   MCH 30.7 26.0 - 34.0 pg   MCHC 31.8 30.0 - 36.0 g/dL  RDW 18.7 (H) 11.5 - 15.5 %   Platelets 212 150 - 400 K/uL   nRBC 0.0 0.0 - 0.2 %  Renal function panel (daily at 0500)     Status: Abnormal   Collection Time: 10/04/22  4:48 AM  Result Value Ref Range   Sodium 135 135 - 145 mmol/L   Potassium 4.2 3.5 - 5.1 mmol/L   Chloride 95 (L) 98 - 111 mmol/L   CO2 26 22 - 32 mmol/L   Glucose, Bld 87 70 - 99 mg/dL   BUN 41 (H) 8 - 23 mg/dL   Creatinine, Ser 1.61 (H) 0.61 - 1.24 mg/dL   Calcium 8.3 (L) 8.9 - 10.3 mg/dL   Phosphorus 5.3 (H) 2.5 - 4.6 mg/dL   Albumin 1.9 (L) 3.5 - 5.0 g/dL   GFR, Estimated 16 (L) >60 mL/min   Anion gap 14 5 - 15  Hemoglobin and hematocrit, blood     Status: Abnormal   Collection Time: 10/04/22  4:48 AM  Result Value Ref Range   Hemoglobin 7.9 (L) 13.0 - 17.0 g/dL   HCT 09.6 (L) 04.5 - 40.9 %    I have reviewed pertinent nursing notes, vitals, labs, and images as necessary. I have ordered labwork to follow up on as indicated.  I have reviewed the last notes from staff over past 24 hours. I have discussed patient's care plan and test results with nursing staff, CM/SW, and other staff as appropriate.    LOS: 37 days   Lewie Chamber, MD [facility] 10/04/2022, 12:41 PM

## 2022-10-04 NOTE — Progress Notes (Signed)
Daily Progress Note   Patient Name: Albert Harris       Date: 10/04/2022 DOB: 09/14/1955  Age: 67 y.o. MRN#: [id number] Attending Physician: Lewie Chamber, MD Primary Care Physician: [name], NP Admit Date: 08/28/2022 Length of Stay: 37 days  Reason for Consultation/Follow-up: Establishing goals of care  HPI/Patient Profile:  67 y.o. male  with past medical history of ESRD on HD, peripheral vascular disease, chronic systolic CHF with EF 35 to 40%, and COPD.  He presented to [facility] on 08/28/2022 with abdominal pain, nausea, and unable to feel his left foot.  He was found to have a left femoral pseudoaneurysm and was taken to the OR for repair of his distal left external iliac artery.  He required emergent CRRT due to hypotension and hyperkalemia.  Hospitalization has been complicated by MRSA bacteremia secondary to post-op wound infection and recurrent rectal bleeding requiring multiple transfusions.    Palliative Medicine was consulted for goals of care in the setting of ESRD, severe vascular disease, and bleeding.  Subjective:   Subjective: Chart Reviewed. Updates received. Patient Assessed. Created space and opportunity for patient  and family to explore thoughts and feelings regarding current medical situation.  Today's Discussion: Today saw the patient at bedside.  He states he is currently having pain with his ankle rated about 7 out of 10.  At that time the nurse entered the room and I told her about his pain and she is getting pain medication for him.  He denies nausea and vomiting at this time.  He states his appetite is "okay" and will typically eat 60% of his tray at most, less if they bring him something that he did not order.  I encouraged him to request nursing to order other foods if they bring him the wrong order.  We discussed his ongoing discussions with his family.  He has begun speaking with them.  He states that "if it comes down to it then I am ready to go."  I  mentioned the most pressing decision would be discussion of CODE STATUS.  He states his family is against "pulling the plug".  I reexplained CODE STATUS is not "pulling the plug" or even changing his ongoing medical management.  It is simply having a plan in place to not resuscitate him in the most drastic tragic of situations, knowing how sick he is at baseline and his unlikelihood and survive resuscitation.  He verbalized understanding.  We discussed, he confirms, and I acknowledged his wish to continue having these discussions personally between him and his family.  I told him that we would not insert ourselves into the conversation but we are available if he would like assistance with these conversations.  He verbalized understanding.  I explained that palliative medicine will continue to follow along his clinical progress.  I told him that we would check back in in a couple days to see how he is doing.  I provided emotional and general support through therapeutic listening, empathy, sharing of stories, therapeutic touch, and other techniques. I answered all questions and addressed all concerns to the best of my ability.  Review of Systems  Respiratory:  Negative for shortness of breath.   Gastrointestinal:  Negative for abdominal pain, nausea and vomiting.  Musculoskeletal:        Noted ankle pain 7/10    Objective:   Vital Signs:  BP (!) 155/69 (BP Location: Left Arm)   Pulse 87   Temp 98.4 F (36.9  C) (Oral)   Resp 20   Ht 5' 7.99" (1.727 m)   Wt 85 kg   SpO2 100%   BMI 28.50 kg/m   Physical Exam: Physical Exam Vitals and nursing note reviewed.  Constitutional:      General: He is not in acute distress.    Appearance: He is ill-appearing.  HENT:     Head: Normocephalic and atraumatic.  Cardiovascular:     Rate and Rhythm: Normal rate.  Pulmonary:     Effort: Pulmonary effort is normal. No respiratory distress.  Abdominal:     General: Abdomen is flat.     Palpations:  Abdomen is soft.  Skin:    Comments: Nonpalpable pulses in lower extremities, per nursing are dopplered at this point Three of his toes have a blackened/necrotic appearance  Neurological:     Mental Status: He is alert.     Palliative Assessment/Data: 30%    Existing Vynca/ACP Documentation: Advanced directive signed 09/23/2022 (HCPOA only; no Living Will)  Assessment & Plan:   Impression: Present on Admission:  (Resolved) Lactic acidosis  (Resolved) Metabolic encephalopathy  Lower GI bleed  SUMMARY OF RECOMMENDATIONS   Full code for now Continue repeated messaging on the seriousness of his illness Allow time for further family discussion PMT available to assist w/ family discussions, pt declines for now In the meantime continue full scope of care PMT will follow-up 10/06/2022  Symptom Management:  Per primary team PMT is available to assist as needed  Code Status: Full code  Prognosis: Unable to determine  Discharge Planning: To Be Determined  Discussed with: Patient, medical team nursing team  Thank you for allowing [place] to participate in the care of Albert Harris PMT will continue to support holistically.  Billing based on MDM: High  Problems Addressed: One acute or chronic illness or injury that poses a threat to life or bodily function  Amount and/or Complexity of Data: Category 3:Discussion of management or test interpretation with external physician/other qualified health care professional/appropriate source (not separately reported)  Risks: N/A    Wynne Dust, NP Palliative Medicine Team  Team Phone # [phone] (Nights/Weekends)  02/04/2021, 8:17 AM

## 2022-10-04 NOTE — Consult Note (Signed)
WOC Nurse Consult Note: Reason for Consult:Left groin NPWT Wound type:surgical  Order received for Monday/Thursday NPWT dressing changes to left groin. Next dressing change will be Monday, 4/29.  WOC nursing team will follow, and will remain available to this patient, the nursing and medical teams.   Thank you for inviting [place] to participate in this patient's Plan of Care.  Ladona Mow, MSN, RN, CNS, GNP, Leda Min, [employer], [employer] phone:  [phone]

## 2022-10-05 DIAGNOSIS — D62 Acute posthemorrhagic anemia: Secondary | ICD-10-CM | POA: Diagnosis not present

## 2022-10-05 DIAGNOSIS — I724 Aneurysm of artery of lower extremity: Secondary | ICD-10-CM | POA: Diagnosis not present

## 2022-10-05 DIAGNOSIS — K922 Gastrointestinal hemorrhage, unspecified: Secondary | ICD-10-CM | POA: Diagnosis not present

## 2022-10-05 DIAGNOSIS — A419 Sepsis, unspecified organism: Secondary | ICD-10-CM | POA: Diagnosis not present

## 2022-10-05 LAB — HEMOGLOBIN AND HEMATOCRIT, BLOOD
HCT: 22.5 % — ABNORMAL LOW (ref 39.0–52.0)
HCT: 24.6 % — ABNORMAL LOW (ref 39.0–52.0)
Hemoglobin: 7.3 g/dL — ABNORMAL LOW (ref 13.0–17.0)
Hemoglobin: 7.6 g/dL — ABNORMAL LOW (ref 13.0–17.0)

## 2022-10-05 LAB — RENAL FUNCTION PANEL
Albumin: 1.8 g/dL — ABNORMAL LOW (ref 3.5–5.0)
Anion gap: 14 (ref 5–15)
BUN: 70 mg/dL — ABNORMAL HIGH (ref 8–23)
CO2: 26 mmol/L (ref 22–32)
Calcium: 8.1 mg/dL — ABNORMAL LOW (ref 8.9–10.3)
Chloride: 96 mmol/L — ABNORMAL LOW (ref 98–111)
Creatinine, Ser: 5.26 mg/dL — ABNORMAL HIGH (ref 0.61–1.24)
GFR, Estimated: 11 mL/min — ABNORMAL LOW (ref >60)
Glucose, Bld: 99 mg/dL (ref 70–99)
Phosphorus: 5.7 mg/dL — ABNORMAL HIGH (ref 2.5–4.6)
Potassium: 4.5 mmol/L (ref 3.5–5.1)
Sodium: 136 mmol/L (ref 135–145)

## 2022-10-05 LAB — CK: Total CK: 45 U/L — ABNORMAL LOW (ref 49–397)

## 2022-10-05 MED ORDER — CHLORHEXIDINE GLUCONATE CLOTH 2 % EX PADS
6.0000 | MEDICATED_PAD | Freq: Every day | CUTANEOUS | Status: DC
  Administered 2022-10-06 – 2022-10-07 (×2): 6 via TOPICAL

## 2022-10-05 NOTE — Progress Notes (Signed)
PT Cancellation Note  Patient Details Name: Albert Harris MRN: [id number] DOB: [birth year]   Cancelled Treatment:    Reason Eval/Treat Not Completed: (P) Pain limiting ability to participate (Attempt in AM, pt having wound vac/dressing change. Attempt  again ~12:50pm after pt premedicated with dialudid and pt defers due to pain/wanting to eat.) Will continue efforts per PT plan of care as schedule permits.   [name] 10/05/2022, 1:06 PM

## 2022-10-05 NOTE — Consult Note (Signed)
WOC Nurse wound follow up Wound type:surgical Measurement:7cm x 4.5cm x 1cm at left groin with 10 cm linear extension to left iliac crest with 3cm x 0.3cm x 0.1cm open area in center Wound bed:red, moist, Wound at groin with 20% yellow tissue, 80% red tissue Drainage (amount, consistency, odor) serous Periwound:intact Dressing procedure/placement/frequency: NPWT pump had been turned off. Patient had been premedicated prior to my arrival. Dressing (1 piece black foam) removed and wound cleansed. One skin barrier ring stretched and used to encircle groin portion of wound to enhance seal. One piece of lack foam fashioned so that it fills/covers dead space. DermaTac drape applied and dressing attached to continuous negative pressure. An immediate seal is achieved.  Patient tolerated procedure well.  Daughter in room.  Two dressing kits and two skin barrier rings are in the room for upcoming dressing changes. Next dressing change is on Thursday, May 2.  WOC nursing team will follow, and will remain available to this patient, the nursing and medical teams.    Thank you for inviting [place] to participate in this patient's Plan of Care.  Ladona Mow, MSN, RN, CNS, GNP, Leda Min, [employer], [employer] phone:  [phone]

## 2022-10-05 NOTE — Progress Notes (Signed)
OT Cancellation Note  Patient Details Name: Albert Harris MRN: [id number] DOB: 03/10/1956   Cancelled Treatment:    Reason Eval/Treat Not Completed: Patient at procedure or test/ unavailable;Patient declined, no reason specified. Attempted Patient x2 today. First time changing wound vac on groin site, second - fatigued and wanting to eat lunch. OT will continue to follow acutely.   Evern Bio Dontasia Miranda 10/05/2022, 12:55 PM  Nyoka Cowden OTR/L Acute Rehabilitation Services Office: [phone]

## 2022-10-05 NOTE — Progress Notes (Signed)
  Progress Note    10/05/2022 5:59 PM 9 Days Post-Op  Subjective:  resting comfortably.   Vitals:   10/05/22 1225 10/05/22 1729  BP: (!) 169/80 (!) 188/87  Pulse: 90 88  Resp: (!) 22 18  Temp: 98 F (36.7 C) 98.3 F (36.8 C)  SpO2:  98%   Physical Exam: Cardiac:  regular Lungs:  non labored Incisions: Left groin with VAC to suction Extremities: LLE with faint PT/Pero signals. Foot/toes cool Neurologic: alert and oriented  CBC    Component Value Date/Time   WBC 18.2 (H) 10/03/2022 1600   RBC 2.51 (L) 10/03/2022 1600   HGB 7.6 (L) 10/05/2022 1702   HGB 10.4 (L) 06/18/2021 1431   HCT 24.6 (L) 10/05/2022 1702   HCT 31.6 (L) 06/18/2021 1431   PLT 212 10/03/2022 1600   PLT 142 (L) 06/18/2021 1431   MCV 96.4 10/03/2022 1600   MCV 96 06/18/2021 1431   MCH 30.7 10/03/2022 1600   MCHC 31.8 10/03/2022 1600   RDW 18.7 (H) 10/03/2022 1600   RDW 14.3 06/18/2021 1431   LYMPHSABS 1.3 09/30/2022 0803   LYMPHSABS 1.5 06/18/2021 1431   MONOABS 1.1 (H) 09/30/2022 0803   EOSABS 0.4 09/30/2022 0803   EOSABS 1.0 (H) 06/18/2021 1431   BASOSABS 0.1 09/30/2022 0803   BASOSABS 0.1 06/18/2021 1431    BMET    Component Value Date/Time   NA 136 10/05/2022 0417   NA 141 06/18/2021 1431   K 4.5 10/05/2022 0417   CL 96 (L) 10/05/2022 0417   CO2 26 10/05/2022 0417   GLUCOSE 99 10/05/2022 0417   BUN 70 (H) 10/05/2022 0417   BUN 20 06/18/2021 1431   CREATININE 5.26 (H) 10/05/2022 0417   CREATININE 1.15 02/19/2017 1049   CALCIUM 8.1 (L) 10/05/2022 0417   CALCIUM 8.5 (L) 06/10/2020 0900   GFRNONAA 11 (L) 10/05/2022 0417   GFRAA 6 (L) 03/04/2020 1045    INR    Component Value Date/Time   INR 1.2 08/12/2022 1501     Intake/Output Summary (Last 24 hours) at 10/05/2022 1759 Last data filed at 10/05/2022 1300 Gross per 24 hour  Intake 480 ml  Output 0 ml  Net 480 ml      Assessment/Plan:  67 y.o. male is s/p removal of infected left femoral patch, iliofemoral bypass, with  sartorius muscle flap 09/09/22  9 Days Post-Op   LLE remains stable with faint pero signals.  The amputation down the road Continue multimodal pain control Will change VAC dressing T/F Continue Therapies as able Continuing honest discussion regarding palliative care and wishes moving forward.  Lelan is thinking about his options.  Victorino Sparrow [facility] [phone] 10/05/2022 5:59 PM

## 2022-10-05 NOTE — Progress Notes (Signed)
Pharmacy Antibiotic Note  Albert Harris is a 67 y.o. 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 Now with MRSA bacteremia. Noted patient is ESRD HD TTS PTA, with a right arm AV graft and a catheter in his right IJ. Pharmacy consulted to dose daptomycin due to enterococcus faecium and staph aureus growing from tissue cultures from 4/3.  HD on TTS schedule - next 4/30. CK 45 today.  Plan: Continue Daptomycin 600mg  (8 mg/kg) IV post HD on Tues/Thurs and 750 mg (10 m/gk) IV post HD on Saturdays Monitor weekly CK, follow HD schedule Discharge orders pended   Height: 5' 7.99" (172.7 cm) Weight: 85 kg (187 lb 6.3 oz) IBW/kg (Calculated) : 68.38  Temp (24hrs), Avg:98.3 F (36.8 C), Min:98.2 F (36.8 C), Max:98.4 F (36.9 C)  Recent Labs  Lab 09/29/22 0957 09/30/22 0803 10/01/22 0533 10/01/22 1224 10/02/22 0539 10/03/22 0512 10/03/22 1454 10/03/22 1600 10/04/22 0448 10/05/22 0417  WBC 12.3* 13.6*  --  15.1*  --   --   --  18.2*  --   --   CREATININE 6.41* 4.37*   < > 6.30* 4.30* 5.67* 2.88*  --  3.82* 5.26*   < > = values in this interval not displayed.     Estimated Creatinine Clearance: 14.3 mL/min (A) (by C-G formula based on SCr of 5.26 mg/dL (H)).    Allergies  Allergen Reactions   Benadryl [Diphenhydramine] Rash   Lidocaine-Prilocaine Rash    Caused red marks up and down arm   Antimicrobials this admission:  Cefazolin 3/23 x 1 Vancomycin 3/24 >> 4/5 Zosyn 4/3 >>4/3 Daptomycin 4/5>>5/17  CK 4/8 57 4/16 25 4/22 18 4/29 CK 45  Microbiology results:  4/3 L groin wound: MSRA + VRE 4/3 MRSA PCR: neg 3/27 Bcx: neg 3/25 Bcx: MRSA 3/23 Bcx: MRSA   Thank you for involving pharmacy in this patient's care.  Loura Back, PharmD, BCPS Clinical Pharmacist Clinical phone for 10/05/2022 is [phone] 10/05/2022 10:52 AM

## 2022-10-05 NOTE — Progress Notes (Signed)
Physical Therapy Treatment Patient Details Name: Albert Harris MRN: [id number] DOB: Nov 09, 1955 Today's Date: 10/05/2022   History of Present Illness 67 year old male  ER with abdominal pain, nausea, diaphoresis and unable to feel his Lt foot. Hypotensive with lactic acidosis in ER.  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 CRRT 3/22-3/26.  3/22 repair of distal Lt external iliac artery. On 3/30, Hgb drop from 9 to 7 with worsening abdominal pain, CT abd 3/30 shows slightly larger anterior pelvic extraperitoneal hematoma. 4/18 -  massive hemoptysis requiring 4 units PRBC, bedside flex sig with bleeding ulcer, hemostatic gel applied, ETT 4/18-19. PMHx: PAD, ESRD HD, recently hospitalized 3/16-18 for acute on chronic left lower extremity ischemia.    PT Comments    Pt received in supine, c/o feeling fatigued and hot after premedication, agreeable to minimal bed level session with max encouragement. Pt slow to initiate and perform tasks and defers EOB despite max encouragement after hygiene assist. Reviewed supine/sidelying UE/LE exercises to perform between sessions, pt will need reinforcement. Note plan still for SNF however ongoing Palliative discussions. Emphasis on benefits of mobility and frequent repositioning for pressure relief/hygiene. Pt continues to benefit from PT services to progress toward functional mobility goals.    Recommendations for follow up therapy are one component of a multi-disciplinary discharge planning process, led by the attending physician.  Recommendations may be updated based on patient status, additional functional criteria and insurance authorization.  Follow Up Recommendations  Can patient physically be transported by private vehicle: No    Assistance Recommended at Discharge Frequent or constant Supervision/Assistance  Patient can return home with the following Assist for transportation;Assistance with cooking/housework;Help with stairs  or ramp for entrance;A lot of help with walking and/or transfers;A lot of help with bathing/dressing/bathroom   Equipment Recommendations  Other (comment) (TBD; currently would need mechanical lift, hospital bed with air mattress and reclining back wheelchair with roho cushion and elevating leg rests)    Recommendations for Other Services       Precautions / Restrictions Precautions Precautions: Fall Precaution Comments: L groin wound vac; Per verbal order of Dr. Chestine Spore and Dr. Lenell Antu 4/19 - unrestricted L hip ROM, pt appropriate to move OOB and mobilize as able. Restrictions Weight Bearing Restrictions: Yes LLE Weight Bearing: Weight bearing as tolerated     Mobility  Bed Mobility Overal bed mobility: Needs Assistance Bed Mobility: Rolling Rolling: Mod assist         General bed mobility comments: patient performed rolling in bed to clean bottom, patient able to maintain side lying with assistance to roll completely on side to allow for cleaning, pt rolled to each side    Transfers                   General transfer comment: patient declined after hygiene assist and rolling    Ambulation/Gait                   Stairs             Wheelchair Mobility    Modified Rankin (Stroke Patients Only)       Balance Overall balance assessment: Needs assistance     Sitting balance - Comments: patient declined sitting on EOB                                    Cognition Arousal/Alertness: Awake/alert Behavior During  Therapy: WFL for tasks assessed/performed Overall Cognitive Status: Within Functional Limits for tasks assessed                                 General Comments: required encouragment to participate, limited by pain, not feeling well; reluctant to attempt anything after rolling and peri care despite max encouragement.        Exercises General Exercises - Lower Extremity Ankle Circles/Pumps: AROM, Right,  Sidelying, 5 reps Short Arc Quad: AROM, Right, 5 reps, Supine (TotalA/PROM for LLE knee extension) Heel Slides: AAROM, 5 reps, Supine, Right Hip ABduction/ADduction: AAROM, Both (a couple reps while repositioning, pt defers more) Other Exercises Other Exercises: reviewed sidelying pushing/pulling with UE on rail while awaiting peri-care/dressing change, pt performs x1 rep then stops, c/o fatigue    General Comments General comments (skin integrity, edema, etc.): Patient stating he felt hot, temp checked with 98.3      Pertinent Vitals/Pain Pain Assessment Pain Assessment: Faces Faces Pain Scale: Hurts even more Pain Location: bottom, LLE Pain Descriptors / Indicators: Grimacing, Guarding, Sore, Constant, Discomfort, Moaning Pain Intervention(s): Limited activity within patient's tolerance, Monitored during session, Repositioned           PT Goals (current goals can now be found in the care plan section) Acute Rehab PT Goals Patient Stated Goal: Less pain so I can get better and go home. PT Goal Formulation: With patient/family Time For Goal Achievement: 09/30/22 Progress towards PT goals: Progressing toward goals    Frequency    Min 2X/week      PT Plan Current plan remains appropriate (pending medical stability)    Co-evaluation PT/OT/SLP Co-Evaluation/Treatment: Yes Reason for Co-Treatment: Complexity of the patient's impairments (multi-system involvement);For patient/therapist safety PT goals addressed during session: Mobility/safety with mobility;Strengthening/ROM OT goals addressed during session: ADL's and self-care;Strengthening/ROM      AM-PAC PT "6 Clicks" Mobility   Outcome Measure  Help needed turning from your back to your side while in a flat bed without using bedrails?: A Lot Help needed moving from lying on your back to sitting on the side of a flat bed without using bedrails?: A Lot Help needed moving to and from a bed to a chair (including a  wheelchair)?: Total Help needed standing up from a chair using your arms (e.g., wheelchair or bedside chair)?: Total Help needed to walk in hospital room?: Total Help needed climbing 3-5 steps with a railing? : Total 6 Click Score: 8    End of Session   Activity Tolerance: Patient limited by fatigue;Patient limited by pain Patient left: in bed;with call bell/phone within reach;with bed alarm set;Other (comment) (heels floated (pt defers Prevalon boot), HOB >30* for him to take medicine and drink water) Nurse Communication: Mobility status;Other (comment);Patient requests pain meds (hygiene assist (RN present for portion of session to change mepilex dresssing given wound packing inside dressing) PT Visit Diagnosis: Other abnormalities of gait and mobility (R26.89);Muscle weakness (generalized) (M62.81);Difficulty in walking, not elsewhere classified (R26.2)     Time: 1610-9604 PT Time Calculation (min) (ACUTE ONLY): 29 min  Charges:  $Therapeutic Activity: 8-22 mins                     Chlora Mcbain P., PTA Acute Rehabilitation Services Secure Chat Preferred 9a-5:30pm Office: [phone]    Dorathy Kinsman [facility] 10/05/2022, 2:56 PM

## 2022-10-05 NOTE — Progress Notes (Signed)
Occupational Therapy Treatment Patient Details Name: Albert Harris MRN: [id number] DOB: 05-31-1956 Today's Date: 10/05/2022   History of present illness 67 year old male  ER with abdominal pain, nausea, diaphoresis and unable to feel his Lt foot. Hypotensive with lactic acidosis in ER.  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 CRRT 3/22-3/26.  3/22 repair of distal Lt external iliac artery. On 3/30, Hgb drop from 9 to 7 with worsening abdominal pain, CT abd 3/30 shows slightly larger anterior pelvic extraperitoneal hematoma. 4/18 -  massive hemoptysis requiring 4 units PRBC, bedside flex sig with bleeding ulcer, hemostatic gel applied, ETT 4/18-19. PMHx: PAD, ESRD HD, recently hospitalized 3/16-18 for acute on chronic left lower extremity ischemia.   OT comments  Patient received in bed with complaints of feeling hot. Temperature checked with 98.3 temp. Patient agreeable to OT/PT session but declined sitting on EOB. Patient seen to address bed mobility with rolling and was found to be bowel incontinent. Patient able to maintain side lying to clean and required dressing changed with nursing assisting. Patient assisted with positioning in bed for comfort. OT to continue to follow to address EOB sitting balance, UB bathing and dressing, and UE HEP. Patient will benefit from continued inpatient follow up therapy, <3 hours/day.   Recommendations for follow up therapy are one component of a multi-disciplinary discharge planning process, led by the attending physician.  Recommendations may be updated based on patient status, additional functional criteria and insurance authorization.    Assistance Recommended at Discharge Intermittent Supervision/Assistance  Patient can return home with the following  A lot of help with walking and/or transfers;A lot of help with bathing/dressing/bathroom;Assistance with cooking/housework;Assist for transportation;Help with stairs or ramp for  entrance   Equipment Recommendations  Other (comment) (defer to next venue of care)    Recommendations for Other Services      Precautions / Restrictions Precautions Precautions: Fall Precaution Comments: L groin wound vac; Per verbal order of Dr. Chestine Spore and Dr. Lenell Antu 4/19 - unrestricted L hip ROM, pt appropriate to move OOB and mobilize as appropriate. Restrictions Weight Bearing Restrictions: Yes LLE Weight Bearing: Weight bearing as tolerated       Mobility Bed Mobility Overal bed mobility: Needs Assistance Bed Mobility: Rolling Rolling: Mod assist         General bed mobility comments: patient performed rolling in bed to clean bottom, patient able to maintain side lying with assistance to roll completely on side to allow for cleaning    Transfers                   General transfer comment: patient declined     Balance Overall balance assessment: Needs assistance     Sitting balance - Comments: patient declined sitting on EOB                                   ADL either performed or assessed with clinical judgement   ADL Overall ADL's : Needs assistance/impaired             Lower Body Bathing: Total assistance;Bed level Lower Body Bathing Details (indicate cue type and reason): cleaned bottom at bed level due to bowel incontenance                       General ADL Comments: patient able to maintain side lying while bathing bottom  Extremity/Trunk Assessment              Vision       Perception     Praxis      Cognition Arousal/Alertness: Awake/alert Behavior During Therapy: WFL for tasks assessed/performed Overall Cognitive Status: Within Functional Limits for tasks assessed                                 General Comments: required encouragment to participate, limited by pain, not feeling well        Exercises      Shoulder Instructions       General Comments Patient stating he  felt hot, temp checked with 98.3    Pertinent Vitals/ Pain       Pain Assessment Pain Assessment: Faces Faces Pain Scale: Hurts even more Pain Location: bottom, LLE Pain Descriptors / Indicators: Grimacing, Guarding, Sore, Constant, Discomfort, Moaning Pain Intervention(s): Limited activity within patient's tolerance, Monitored during session, Repositioned  Home Living                                          Prior Functioning/Environment              Frequency  Min 2X/week        Progress Toward Goals  OT Goals(current goals can now be found in the care plan section)  Progress towards OT goals: Progressing toward goals  Acute Rehab OT Goals Patient Stated Goal: get better OT Goal Formulation: With patient Time For Goal Achievement: 10/12/22 Potential to Achieve Goals: Good ADL Goals Pt Will Perform Grooming: with set-up;sitting Pt Will Perform Upper Body Dressing: with min assist;sitting Pt Will Perform Lower Body Dressing: with max assist;sit to/from stand Pt Will Transfer to Toilet: squat pivot transfer;bedside commode;with max assist Additional ADL Goal #1: Pt will identify 3+ fall prevention strategies for use in the home setting. Additional ADL Goal #2: Pt will identify 3+ fall prevention strategies for use in the home setting.  Plan Discharge plan remains appropriate    Co-evaluation    PT/OT/SLP Co-Evaluation/Treatment: Yes Reason for Co-Treatment: Complexity of the patient's impairments (multi-system involvement);For patient/therapist safety   OT goals addressed during session: ADL's and self-care;Strengthening/ROM      AM-PAC OT "6 Clicks" Daily Activity     Outcome Measure   Help from another person eating meals?: A Little Help from another person taking care of personal grooming?: A Little Help from another person toileting, which includes using toliet, bedpan, or urinal?: Total Help from another person bathing (including  washing, rinsing, drying)?: A Lot Help from another person to put on and taking off regular upper body clothing?: A Lot Help from another person to put on and taking off regular lower body clothing?: Total 6 Click Score: 12    End of Session    OT Visit Diagnosis: Unsteadiness on feet (R26.81);Muscle weakness (generalized) (M62.81);Dizziness and giddiness (R42)   Activity Tolerance Patient limited by pain   Patient Left in bed;with call bell/phone within reach   Nurse Communication Mobility status;Other (comment) (soiled wound dressing)        Time: 4098-1191 OT Time Calculation (min): 29 min  Charges: OT General Charges $OT Visit: 1 Visit OT Treatments $Self Care/Home Management : 8-22 mins  Alfonse Flavors, OTA Acute Rehabilitation Services  Office [phone]   Magalie Almon  Jeannett Senior 10/05/2022, 2:39 PM

## 2022-10-05 NOTE — Progress Notes (Addendum)
[facility] Progress Note   Subjective:   seen in room, no new c/o  Objective Vitals:   10/04/22 2000 10/05/22 0823 10/05/22 0824 10/05/22 1225  BP:  (!) 175/77  (!) 169/80  Pulse:  81  90  Resp:  19  (!) 22  Temp: 98.2 F (36.8 C)   98 F (36.7 C)  TempSrc: Oral   Oral  SpO2:   98%   Weight:      Height:       Physical Exam General: Awake, alert,  On RA, NAD Heart: RRR; no murmur Lungs: CTA anteriorly Abdomen: soft Extremities: dep edema; L foot with ischemic distal 3rd-5th toes Dialysis Access: [facility] Dialysis Orders:  OP HD: TTS SW  4h   350/800  64.5kg  RFA AVG -> clotted/ new TDC in place Heparin none  - last OP HD 3/14, post wt 65.9kg - venofer 50mg  IV weekly - rocaltrol 0.25 mcg po tiw - phoslo 2 ac tid - no esa, last Hb 11.0  Assessment/Plan: Recurrent lower GIB: S/p colonoscopy 09/15/22, then repeat sigmoidoscopy 4/11. Several bleeding ulcers addressed.  Path came back + Crohn's - started on mesalamine by GI. Brisk re-bleed 4/18, back for emergent sigmoidoscopy with hemostatic gel applied to bleeding ulcer. Patient started bleeding again from his rectum/ anus -s/p suturing of bleeding ulcers 4/20 by Dr. Magnus Ivan. This continues to be a problem but hgb stable the last 48 hours Left common femoral artery pseudoaneurysm - s/p L femoral exploration with pseudoaneurysm repair 08/29/22, then iliofemoral bypass, removal of infected patch with sartorius muscle flap on 09/09/22. Ischemic changes to L 3rd-5th toes - still high risk of limb loss.   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  CVA: + stroke on MRI. Neuro signed off.  ESRD: sp short-term CRRT 3/23- 3/26, now back on iHD. Next HD 4/30 per TTS schedule. BP/volume: Variable, 1+ edema on exam. Push UF as tolerated Anemia (ESRD + ABLA): Last transfused 1U on 4/25 (that brings total to 25U this admit). Also  on Aranesp q Wed (max dose). Staying pretty stable  Secondary HPTH: CorrCa high but now at goal, Phos ok. Continue holding VDRA , continue phoslo for now. Dialysis access: AVG clotted 3/27, went for declot by VVS and then re-clotted. VVS placed R TDC on 4/1.  Hypoalbuminemia - less than 1.5. In hospital > 30 days. Palliative care has revisited EOL, see their notes. Dtr seems accepting but pt is not.  We are likely reaching futility point here. Will try to discuss w/ patient further about this tomorrow.   Vinson Moselle, MD 10/05/2022, 3:20 PM  Recent Labs  Lab 10/04/22 0448 10/04/22 1641 10/05/22 0417  HGB 7.9* 7.9* 7.3*  ALBUMIN 1.9*  --  1.8*  CALCIUM 8.3*  --  8.1*  PHOS 5.3*  --  5.7*  CREATININE 3.82*  --  5.26*  K 4.2  --  4.5    Inpatient medications:  (feeding supplement) PROSource Plus  30 mL Oral TID WC   acetaminophen  650 mg Oral Q6H   ascorbic acid  250 mg Oral BID   budesonide  9 mg Oral Daily   calcium acetate  1,334 mg Oral TID WC   Chlorhexidine Gluconate Cloth  6 each Topical Q0600   collagenase   Topical Daily   darbepoetin (ARANESP) injection - DIALYSIS  200 mcg Subcutaneous Q Wed-1800  liver oil-zinc oxide   Topical TID   methocarbamol  500 mg Oral TID   metoprolol succinate  25 mg Oral Daily   multivitamin  1 tablet Oral QHS   nutrition supplement (JUVEN)  1 packet Oral BID BM   pantoprazole  40 mg Oral BID   polyethylene glycol  17 g Oral Daily   rosuvastatin  10 mg Oral Daily   sodium chloride flush  10-40 mL Intracatheter Q12H   vitamin A  10,000 Units Oral Daily    sodium chloride 0 mL/hr at 09/22/22 0757   DAPTOmycin (CUBICIN) 600 mg in sodium chloride 0.9 % IVPB 600 mg (10/01/22 1812)   DAPTOmycin (CUBICIN) 750 mg in sodium chloride 0.9 % IVPB Stopped (10/03/22 1935)   sodium chloride     alteplase, dicyclomine, heparin, hydrALAZINE, hydrALAZINE, HYDROmorphone (DILAUDID) injection, labetalol, oxyCODONE, pentafluoroprop-tetrafluoroeth,  sodium chloride, traMADol

## 2022-10-05 NOTE — Progress Notes (Signed)
Progress Note    Albert Harris   ZHY:[id number]  DOB: 1955/07/02  DOA: 08/28/2022     38 PCP: [name], NP  Initial CC: abd pain, N/V  Hospital Course: 67 year old male with history of ESRD on HD TTS, PVD,COPD, essential hypertension, chronic systolic congestive heart failure with ejection fraction 35 to 40%, presented with abdominal pain nausea and diaphoresis. Patient was also hypotensive with a lactic acidosis and was admitted to ICU,with a pulseless left lower extremity was found to have femoral pseudoaneurysm.Culture came back with MRSA bacteremia-TEE neg for vegetations.  08/29/22: -Admitted to ICU-on vasopressors for septic shock -s/p OR with Dr. Karin Lieu for left femoral exploration with pseudoaneurysm repair,hematoma evacuation>showed dehiscence at proximal aspect of bovine pericardial patch at the suture line.  -Emergent CRRT for severe hyperkalemia 09/08/22 Transferred to [facility] service  09/09/22 CTA for bleeding on left groin: showed Thrombosis of LEFT CFA pseudoaneurysm with new 3.5 cm filling defect, consistent with arterial thrombus, extending from the PSA neck and into the distal CFA. This is at risk for distal embolization and potential acute LEFT lower extremity ischemia.New 8 mm distal LEFT EIA pseudoaneurysm, medial to the stent distal landing zone. Mild interval enlargement of retroperitoneal hematoma, in a craniocaudal dimension measuring 12.2 cm (previously 11.2 cm).No CTA evidence of active extravasation. Patent RIGHT femoral-to-popliteal graft and bilateral iliac stents.Severe burden of splanchnic arterial atherosclerosis, at risk for chronic mesenteric ischemia. 09/09/22: urgent OR:S/P left common iliac to profunda femoris bypass with ipsilateral nonreversed greater saphenous vein in anatomic tunnel, left common iliac artery angioplasty and stenting, left greater saphenous vein harvest, left sartorius muscle of left groin, left lower extremity angiography with  first-order cannulation Transferred to 2H post op 09/12/22 am: 2 BRBPR> s/p Flex sig>showed 5 rectal ulcers, not actively bleeding- stool brown.advised to avoid constipation, added Miralax BID 09/13/22 AM: again 2 large bloody BM ~ 500cc> got 1 unit PRBC  Interval History:   No events overnight.  Resting in bed with daughter present bedside this morning.  Still no definitive decision made on next steps.  He still seems to be hoping for stabilization and/for some recovery.  No bleeding overnight but hemoglobin now downtrending.  Assessment and Plan:  Acute blood loss anemia from bleeding anal/rectal ulcers possibly from his Crohn's disease - Patient underwent flex sigmoidoscopy on 4/6 was found to have 5 nonbleeding ulcers.  He also underwent colonoscopy which showed multiple rectal ulcers with visible bleeding underwent hemostasis. - Patient developed massive rectal bleeding again on 4/18 - Patient is not a candidate for IR intervention due to severe peripheral vascular disease and anatomy - General surgery on board, was taken to the OR  on 4/20 and underwent S/p EUA and over sewing of ulcers and packing anal canal with gelfoam by Dr. Magnus Ivan. - continue budesonide - Per last surgical notes there may be no options for intervention should the patient have any hemodynamically significant bleeding.  See surgical note from Dr. Magnus Ivan 4/21.  Poor prognosis.  Strongly recommend DNR status. -Remains transfusion dependent and medical care now strongly being considered futile especially with even ongoing dialysis as patient now likely to be bedridden and progressive poor quality of life/functional status  - patient plans to discuss GOC further with family but is still hesitant for making decisions; patient still too hesitant for transitioning to hospice  PVD  Left common femoral artery pseudoaneurysm S/p left groin hematoma evacuation, primary repair of the distal external iliac artery. Further  complicated on 4/3 by  left groin bleeding with new pseudoaneurysm and arterial thrombus. S/p iliofemoral bypass and removal of infected patch with sartorius muscle flap. Further management as per vascular surgery.  - Continue with wound VAC - given his prognosis and current state, doubt would even heal well from amputation    MRSA bacteremia secondary to postop wound infection with MRSA and Enterococcus faecium. Infectious disease note from 4/8 recommending daptomycin for 6 weeks followed by indefinite oral suppression of antibiotics. Monitor CPK weekly TEE is negative for vegetations. Patient will need outpatient follow-up with infectious disease to determine oral antibiotics. - continue daptomycin for now; Last dose of IV antibiotics on 10/23/22   ESRD on HD Nephrology on board and dialysis via right IJ tunneled catheter. Further HD sessions as per nephrology - I have recommended patient consider discontinuation of dialysis in setting of overall poor prognosis in context of his overall clinical situation; palliative care to help facilitate further also - unfortunately, he is not ready to make this decision yet but the conversations will continue   Chronic systolic heart failure Last echocardiogram from January 2024 showed left ventricular ejection fraction of 35 to 40% Pulmonary hypertension Cannot restart antiplatelet agents at this time Fluid management as per dialysis.   Acute metabolic encephalopathy  - from uremia, acidosis and acute 4 mm right subinsular stroke   Retroperitoneal hemorrhage Appears to be Stable    Moderate malnutrition - continue diet   Hypertension:  On metoprolol. Suspect a component of pain.  Continue with hydralazine as needed   COPD:  No wheezing heard.  On RA  Old records reviewed in assessment of this patient   DVT prophylaxis:  SCD's Start: 09/09/22 1841   Code Status:   Code Status: Full Code  Mobility Assessment (last 72 hours)      Mobility Assessment     Row Name 10/04/22 2000 10/03/22 2000 10/02/22 2145 10/02/22 1600 10/02/22 1400   Does patient have an order for bedrest or is patient medically unstable No - Continue assessment No - Continue assessment No - Continue assessment -- --   What is the highest level of mobility based on the progressive mobility assessment? Level 2 (Chairfast) - Balance while sitting on edge of bed and cannot stand Level 2 (Chairfast) - Balance while sitting on edge of bed and cannot stand Level 2 (Chairfast) - Balance while sitting on edge of bed and cannot stand Level 2 (Chairfast) - Balance while sitting on edge of bed and cannot stand Level 2 (Chairfast) - Balance while sitting on edge of bed and cannot stand   Is the above level different from baseline mobility prior to current illness? Yes - Recommend PT order Yes - Recommend PT order Yes - Recommend PT order -- --            Barriers to discharge:  Disposition Plan:  Home Status is: Inpt  Objective: Blood pressure (!) 169/80, pulse 90, temperature 98 F (36.7 C), temperature source Oral, resp. rate (!) 22, height 5' 7.99" (1.727 m), weight 85 kg, SpO2 98 %.  Examination:  Physical Exam Constitutional:      General: He is not in acute distress.    Appearance: Normal appearance. He is well-developed.  HENT:     Head: Normocephalic and atraumatic.     Mouth/Throat:     Mouth: Mucous membranes are moist.  Eyes:     Extraocular Movements: Extraocular movements intact.  Cardiovascular:     Rate and Rhythm: Normal rate and regular  rhythm.     Heart sounds: Normal heart sounds.  Pulmonary:     Effort: Pulmonary effort is normal. No respiratory distress.     Breath sounds: Normal breath sounds. No wheezing.  Abdominal:     General: Bowel sounds are normal. There is no distension.     Palpations: Abdomen is soft.     Tenderness: There is no abdominal tenderness.  Genitourinary:    Comments: Left groin WV in  place Musculoskeletal:        General: Normal range of motion.     Cervical back: Normal range of motion and neck supple.     Comments: Gangrenous left toes  Skin:    General: Skin is warm and dry.  Neurological:     General: No focal deficit present.     Mental Status: He is alert.  Psychiatric:        Mood and Affect: Mood normal.        Behavior: Behavior normal.      Consultants:  Nephrology Palliative care General surgery Vascular surgery  Procedures:    Data Reviewed: Results for orders placed or performed during the hospital encounter of 08/28/22 (from the past 24 hour(s))  Hemoglobin and hematocrit, blood     Status: Abnormal   Collection Time: 10/04/22  4:41 PM  Result Value Ref Range   Hemoglobin 7.9 (L) 13.0 - 17.0 g/dL   HCT 16.1 (L) 09.6 - 04.5 %  Renal function panel (daily at 0500)     Status: Abnormal   Collection Time: 10/05/22  4:17 AM  Result Value Ref Range   Sodium 136 135 - 145 mmol/L   Potassium 4.5 3.5 - 5.1 mmol/L   Chloride 96 (L) 98 - 111 mmol/L   CO2 26 22 - 32 mmol/L   Glucose, Bld 99 70 - 99 mg/dL   BUN 70 (H) 8 - 23 mg/dL   Creatinine, Ser 4.09 (H) 0.61 - 1.24 mg/dL   Calcium 8.1 (L) 8.9 - 10.3 mg/dL   Phosphorus 5.7 (H) 2.5 - 4.6 mg/dL   Albumin 1.8 (L) 3.5 - 5.0 g/dL   GFR, Estimated 11 (L) >60 mL/min   Anion gap 14 5 - 15  CK     Status: Abnormal   Collection Time: 10/05/22  4:17 AM  Result Value Ref Range   Total CK 45 (L) 49 - 397 U/L  Hemoglobin and hematocrit, blood     Status: Abnormal   Collection Time: 10/05/22  4:17 AM  Result Value Ref Range   Hemoglobin 7.3 (L) 13.0 - 17.0 g/dL   HCT 81.1 (L) 91.4 - 78.2 %    I have reviewed pertinent nursing notes, vitals, labs, and images as necessary. I have ordered labwork to follow up on as indicated.  I have reviewed the last notes from staff over past 24 hours. I have discussed patient's care plan and test results with nursing staff, CM/SW, and other staff as  appropriate.    LOS: 38 days   Lewie Chamber, MD [facility] 10/05/2022, 1:28 PM

## 2022-10-06 DIAGNOSIS — K922 Gastrointestinal hemorrhage, unspecified: Secondary | ICD-10-CM | POA: Diagnosis not present

## 2022-10-06 DIAGNOSIS — A419 Sepsis, unspecified organism: Secondary | ICD-10-CM | POA: Diagnosis not present

## 2022-10-06 DIAGNOSIS — N186 End stage renal disease: Secondary | ICD-10-CM | POA: Diagnosis not present

## 2022-10-06 DIAGNOSIS — D62 Acute posthemorrhagic anemia: Secondary | ICD-10-CM | POA: Diagnosis not present

## 2022-10-06 LAB — RENAL FUNCTION PANEL
Albumin: 1.9 g/dL — ABNORMAL LOW (ref 3.5–5.0)
Anion gap: 16 — ABNORMAL HIGH (ref 5–15)
BUN: 100 mg/dL — ABNORMAL HIGH (ref 8–23)
CO2: 23 mmol/L (ref 22–32)
Calcium: 8.7 mg/dL — ABNORMAL LOW (ref 8.9–10.3)
Chloride: 99 mmol/L (ref 98–111)
Creatinine, Ser: 7.03 mg/dL — ABNORMAL HIGH (ref 0.61–1.24)
GFR, Estimated: 8 mL/min — ABNORMAL LOW (ref >60)
Glucose, Bld: 105 mg/dL — ABNORMAL HIGH (ref 70–99)
Phosphorus: 5.4 mg/dL — ABNORMAL HIGH (ref 2.5–4.6)
Potassium: 4.9 mmol/L (ref 3.5–5.1)
Sodium: 138 mmol/L (ref 135–145)

## 2022-10-06 LAB — CBC
HCT: 23.3 % — ABNORMAL LOW (ref 39.0–52.0)
Hemoglobin: 7.3 g/dL — ABNORMAL LOW (ref 13.0–17.0)
MCH: 30.9 pg (ref 26.0–34.0)
MCHC: 31.3 g/dL (ref 30.0–36.0)
MCV: 98.7 fL (ref 80.0–100.0)
Platelets: 226 10*3/uL (ref 150–400)
RBC: 2.36 MIL/uL — ABNORMAL LOW (ref 4.22–5.81)
RDW: 19.1 % — ABNORMAL HIGH (ref 11.5–15.5)
WBC: 13.4 10*3/uL — ABNORMAL HIGH (ref 4.0–10.5)
nRBC: 0 % (ref 0.0–0.2)

## 2022-10-06 LAB — HEMOGLOBIN AND HEMATOCRIT, BLOOD
HCT: 24.9 % — ABNORMAL LOW (ref 39.0–52.0)
Hemoglobin: 7.9 g/dL — ABNORMAL LOW (ref 13.0–17.0)

## 2022-10-06 MED ORDER — HEPARIN SODIUM (PORCINE) 1000 UNIT/ML IJ SOLN
INTRAMUSCULAR | Status: AC
  Administered 2022-10-06: 3200 [IU]
  Filled 2022-10-06: qty 4

## 2022-10-06 NOTE — Progress Notes (Signed)
Progress Note    Albert Harris   ZOX:[id number]  DOB: 1955-08-03  DOA: 08/28/2022     37 PCP: [name], NP  Initial CC: abd pain, N/V  Hospital Course: 67 year old male with history of ESRD on HD TTS, PVD,COPD, essential hypertension, chronic systolic congestive heart failure with ejection fraction 35 to 40%, presented with abdominal pain nausea and diaphoresis. Patient was also hypotensive with a lactic acidosis and was admitted to ICU,with a pulseless left lower extremity was found to have femoral pseudoaneurysm.Culture came back with MRSA bacteremia-TEE neg for vegetations.  08/29/22: -Admitted to ICU-on vasopressors for septic shock -s/p OR with Dr. Karin Lieu for left femoral exploration with pseudoaneurysm repair,hematoma evacuation>showed dehiscence at proximal aspect of bovine pericardial patch at the suture line.  -Emergent CRRT for severe hyperkalemia 09/08/22 Transferred to [facility] service  09/09/22 CTA for bleeding on left groin: showed Thrombosis of LEFT CFA pseudoaneurysm with new 3.5 cm filling defect, consistent with arterial thrombus, extending from the PSA neck and into the distal CFA. This is at risk for distal embolization and potential acute LEFT lower extremity ischemia.New 8 mm distal LEFT EIA pseudoaneurysm, medial to the stent distal landing zone. Mild interval enlargement of retroperitoneal hematoma, in a craniocaudal dimension measuring 12.2 cm (previously 11.2 cm).No CTA evidence of active extravasation. Patent RIGHT femoral-to-popliteal graft and bilateral iliac stents.Severe burden of splanchnic arterial atherosclerosis, at risk for chronic mesenteric ischemia. 09/09/22: urgent OR:S/P left common iliac to profunda femoris bypass with ipsilateral nonreversed greater saphenous vein in anatomic tunnel, left common iliac artery angioplasty and stenting, left greater saphenous vein harvest, left sartorius muscle of left groin, left lower extremity angiography with  first-order cannulation Transferred to 2H post op 09/12/22 am: 2 BRBPR> s/p Flex sig>showed 5 rectal ulcers, not actively bleeding- stool brown.advised to avoid constipation, added Miralax BID 09/13/22 AM: again 2 large bloody BM ~ 500cc> got 1 unit PRBC  Interval History:   He won't really engage in GOC talks anymore. I think combo of shutting down/denial/depression and hoping we'll just leave him alone and let him continue this seemingly futile course.  Now he's just been saying "it is what it is", but no elaboration.   Luckily no bleeding in several days and Hgb seems to be holding steady for a bit. I suspect if this continues, maybe we try to discharge if in a "window" of opportunity, otherwise something is going to have to give.   Assessment and Plan:  Acute blood loss anemia from bleeding anal/rectal ulcers possibly from his Crohn's disease - Patient underwent flex sigmoidoscopy on 4/6 was found to have 5 nonbleeding ulcers.  He also underwent colonoscopy which showed multiple rectal ulcers with visible bleeding underwent hemostasis. - Patient developed massive rectal bleeding again on 4/18 - Patient is not a candidate for IR intervention due to severe peripheral vascular disease and anatomy - General surgery on board, was taken to the OR  on 4/20 and underwent S/p EUA and over sewing of ulcers and packing anal canal with gelfoam by Dr. Magnus Ivan. - continue budesonide - Per last surgical notes there may be no options for intervention should the patient have any hemodynamically significant bleeding.  See surgical note from Dr. Magnus Ivan 4/21.  Poor prognosis.  Strongly recommend DNR status. -Remains transfusion dependent and medical care now strongly being considered futile especially with even ongoing dialysis as patient now likely to be bedridden and progressive poor quality of life/functional status  -patient still too hesitant for transitioning to  hospice but if can't discharge he's stuck  in this middle ground  PVD  Left common femoral artery pseudoaneurysm S/p left groin hematoma evacuation, primary repair of the distal external iliac artery. Further complicated on 4/3 by left groin bleeding with new pseudoaneurysm and arterial thrombus. S/p iliofemoral bypass and removal of infected patch with sartorius muscle flap. Further management as per vascular surgery.  - Continue with wound VAC - given his prognosis and current state, doubt would even heal well from amputation    MRSA bacteremia secondary to postop wound infection with MRSA and Enterococcus faecium. Infectious disease note from 4/8 recommending daptomycin for 6 weeks followed by indefinite oral suppression of antibiotics. Monitor CPK weekly TEE is negative for vegetations. Patient will need outpatient follow-up with infectious disease to determine oral antibiotics. - continue daptomycin for now; Last dose of IV antibiotics on 10/23/22   ESRD on HD Nephrology on board and dialysis via right IJ tunneled catheter. Further HD sessions as per nephrology - I have recommended patient consider discontinuation of dialysis in setting of overall poor prognosis in context of his overall clinical situation; palliative care following as well - unfortunately, he is not ready to make this decision yet but the conversations will need to continue   Chronic systolic heart failure Last echocardiogram from January 2024 showed left ventricular ejection fraction of 35 to 40% Pulmonary hypertension Cannot restart antiplatelet agents at this time Fluid management as per dialysis.   Acute metabolic encephalopathy  - from uremia, acidosis and acute 4 mm right subinsular stroke   Retroperitoneal hemorrhage Appears to be stable    Moderate malnutrition - continue diet   Hypertension:  On metoprolol. Suspect a component of pain.  Continue with hydralazine as needed   COPD:  No wheezing heard.  On RA  Old records reviewed in  assessment of this patient   DVT prophylaxis:  SCD's Start: 09/09/22 1841   Code Status:   Code Status: Full Code  Mobility Assessment (last 72 hours)     Mobility Assessment     Row Name 10/06/22 1331 10/05/22 2000 10/05/22 1448 10/05/22 1400 10/05/22 0800   Does patient have an order for bedrest or is patient medically unstable Yes- Bedfast (Level 1) - Complete Yes- Bedfast (Level 1) - Complete -- -- Yes- Bedfast (Level 1) - Complete   What is the highest level of mobility based on the progressive mobility assessment? Level 1 (Bedfast) - Unable to balance while sitting on edge of bed Level 2 (Chairfast) - Balance while sitting on edge of bed and cannot stand Level 2 (Chairfast) - Balance while sitting on edge of bed and cannot stand Level 2 (Chairfast) - Balance while sitting on edge of bed and cannot stand Level 1 (Bedfast) - Unable to balance while sitting on edge of bed   Is the above level different from baseline mobility prior to current illness? Yes - Recommend PT order Yes - Recommend PT order -- -- --    Row Name 10/04/22 2000 10/03/22 2000         Does patient have an order for bedrest or is patient medically unstable No - Continue assessment No - Continue assessment      What is the highest level of mobility based on the progressive mobility assessment? Level 2 (Chairfast) - Balance while sitting on edge of bed and cannot stand Level 2 (Chairfast) - Balance while sitting on edge of bed and cannot stand      Is the  above level different from baseline mobility prior to current illness? Yes - Recommend PT order Yes - Recommend PT order               Barriers to discharge:  Disposition Plan:  Home Status is: Inpt  Objective: Blood pressure 123/82, pulse 93, temperature 98.1 F (36.7 C), temperature source Oral, resp. rate 13, height 5' 7.99" (1.727 m), weight 85 kg, SpO2 97 %.  Examination:  Physical Exam Constitutional:      General: He is not in acute distress.     Appearance: Normal appearance. He is well-developed.  HENT:     Head: Normocephalic and atraumatic.     Mouth/Throat:     Mouth: Mucous membranes are moist.  Eyes:     Extraocular Movements: Extraocular movements intact.  Cardiovascular:     Rate and Rhythm: Normal rate and regular rhythm.     Heart sounds: Normal heart sounds.  Pulmonary:     Effort: Pulmonary effort is normal. No respiratory distress.     Breath sounds: Normal breath sounds. No wheezing.  Abdominal:     General: Bowel sounds are normal. There is no distension.     Palpations: Abdomen is soft.     Tenderness: There is no abdominal tenderness.  Genitourinary:    Comments: Left groin WV in place Musculoskeletal:        General: Normal range of motion.     Cervical back: Normal range of motion and neck supple.     Comments: Gangrenous left toes  Skin:    General: Skin is warm and dry.  Neurological:     General: No focal deficit present.     Mental Status: He is alert.  Psychiatric:        Mood and Affect: Mood normal.        Behavior: Behavior normal.      Consultants:  Nephrology Palliative care General surgery Vascular surgery  Procedures:    Data Reviewed: Results for orders placed or performed during the hospital encounter of 08/28/22 (from the past 24 hour(s))  Hemoglobin and hematocrit, blood     Status: Abnormal   Collection Time: 10/05/22  5:02 PM  Result Value Ref Range   Hemoglobin 7.6 (L) 13.0 - 17.0 g/dL   HCT 16.1 (L) 09.6 - 04.5 %  Renal function panel (daily at 0500)     Status: Abnormal   Collection Time: 10/06/22  9:13 AM  Result Value Ref Range   Sodium 138 135 - 145 mmol/L   Potassium 4.9 3.5 - 5.1 mmol/L   Chloride 99 98 - 111 mmol/L   CO2 23 22 - 32 mmol/L   Glucose, Bld 105 (H) 70 - 99 mg/dL   BUN 409 (H) 8 - 23 mg/dL   Creatinine, Ser 8.11 (H) 0.61 - 1.24 mg/dL   Calcium 8.7 (L) 8.9 - 10.3 mg/dL   Phosphorus 5.4 (H) 2.5 - 4.6 mg/dL   Albumin 1.9 (L) 3.5 - 5.0 g/dL    GFR, Estimated 8 (L) >60 mL/min   Anion gap 16 (H) 5 - 15  CBC     Status: Abnormal   Collection Time: 10/06/22  9:14 AM  Result Value Ref Range   WBC 13.4 (H) 4.0 - 10.5 K/uL   RBC 2.36 (L) 4.22 - 5.81 MIL/uL   Hemoglobin 7.3 (L) 13.0 - 17.0 g/dL   HCT 91.4 (L) 78.2 - 95.6 %   MCV 98.7 80.0 - 100.0 fL   MCH 30.9 26.0 -  34.0 pg   MCHC 31.3 30.0 - 36.0 g/dL   RDW 21.3 (H) 08.6 - 57.8 %   Platelets 226 150 - 400 K/uL   nRBC 0.0 0.0 - 0.2 %    I have reviewed pertinent nursing notes, vitals, labs, and images as necessary. I have ordered labwork to follow up on as indicated.  I have reviewed the last notes from staff over past 24 hours. I have discussed patient's care plan and test results with nursing staff, CM/SW, and other staff as appropriate.    LOS: 39 days   Lewie Chamber, MD [facility] 10/06/2022, 2:53 PM

## 2022-10-06 NOTE — Progress Notes (Signed)
[facility] Progress Note   Subjective:   seen in room, no new c/o  Objective Vitals:   10/06/22 1100 10/06/22 1137 10/06/22 1210 10/06/22 1225  BP: 128/68 117/67 99/60 117/61  Pulse: 87 88 92 86  Resp: 11 16 15    Temp:   98.1 F (36.7 C)   TempSrc:      SpO2: 98% 97% 99% 97%  Weight:      Height:       Physical Exam General: Awake, alert,  On RA, NAD Heart: RRR; no murmur Lungs: CTA anteriorly Abdomen: soft Extremities: dep edema; L foot with ischemic distal 3rd-5th toes Dialysis Access: [facility] Dialysis Orders:  OP HD: TTS SW  4h   350/800  64.5kg  new TDC (RFA AVG clotted)  Heparin none  - last OP HD 3/14, post wt 65.9kg - venofer 50mg  IV weekly - rocaltrol 0.25 mcg po tiw - phoslo 2 ac tid - no esa, last Hb 11.0  Assessment/Plan: Recurrent lower GIB: S/p colonoscopy 09/15/22, then repeat sigmoidoscopy 4/11. Several bleeding ulcers addressed.  Path came back + Crohn's - started on mesalamine by GI. Brisk re-bleed 4/18, back for emergent sigmoidoscopy with hemostatic gel applied to bleeding ulcer. Patient started bleeding again from his rectum/ anus -s/p suturing of bleeding ulcers 4/20 by Dr. Magnus Ivan. This continues to be a problem but hgb stable the last 48 hours Left common femoral artery pseudoaneurysm - s/p L femoral exploration with pseudoaneurysm repair 08/29/22, then iliofemoral bypass, removal of infected patch with sartorius muscle flap on 09/09/22. Ischemic changes to L 3rd-5th toes - still high risk of limb loss.   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 Neuro signed off.  ESRD: sp short-term CRRT 3/23- 3/26, now back on iHD. Next HD today.  BP/volume: Variable, 1+ edema on exam. Push UF as tolerated. Wts are off.  Anemia (ESRD + ABLA): Last transfused 1U on 4/25 (total of 25U this admit). Also on Aranesp q Wed (max dose).  Staying pretty stable  Secondary HPTH: CorrCa high but now at goal, Phos ok. Continue holding VDRA , continue phoslo for now. Dialysis access: AVG clotted 3/27, went for declot by VVS and then re-clotted. VVS placed R TDC on 4/1.  Hypoalbuminemia - less than 1.5, in hospital > 30 days. Palliative care has revisited EOL, see their notes. Dtr seems accepting but pt is not. Had discussion today describing potential options for DNR and /or hospice transition, but didn't really get much feedback and no questions were asked by the patient. Remains full code.   Albert Moselle, MD 10/06/2022, 1:24 PM  Recent Labs  Lab 10/05/22 0417 10/05/22 1702 10/06/22 0913 10/06/22 0914  HGB 7.3* 7.6*  --  7.3*  ALBUMIN 1.8*  --  1.9*  --   CALCIUM 8.1*  --  8.7*  --   PHOS 5.7*  --  5.4*  --   CREATININE 5.26*  --  7.03*  --   K 4.5  --  4.9  --      Inpatient medications:  (feeding supplement) PROSource Plus  30 mL Oral TID WC   acetaminophen  650 mg Oral Q6H   ascorbic acid  250 mg Oral BID   budesonide  9 mg Oral Daily   calcium acetate  1,334 mg Oral TID WC   Chlorhexidine Gluconate Cloth  6 each  Topical Q0600   Chlorhexidine Gluconate Cloth  6 each Topical Q0600   collagenase   Topical Daily   darbepoetin (ARANESP) injection - DIALYSIS  200 mcg Subcutaneous Q Wed-1800   liver oil-zinc oxide   Topical TID   methocarbamol  500 mg Oral TID   metoprolol succinate  25 mg Oral Daily   multivitamin  1 tablet Oral QHS   nutrition supplement (JUVEN)  1 packet Oral BID BM   pantoprazole  40 mg Oral BID   polyethylene glycol  17 g Oral Daily   rosuvastatin  10 mg Oral Daily   sodium chloride flush  10-40 mL Intracatheter Q12H   vitamin A  10,000 Units Oral Daily    sodium chloride 0 mL/hr at 09/22/22 0757   DAPTOmycin (CUBICIN) 600 mg in sodium chloride 0.9 % IVPB 600 mg (10/01/22 1812)   DAPTOmycin (CUBICIN) 750 mg in sodium chloride 0.9 % IVPB Stopped (10/03/22 1935)   sodium chloride      alteplase, dicyclomine, heparin, hydrALAZINE, hydrALAZINE, HYDROmorphone (DILAUDID) injection, labetalol, oxyCODONE, pentafluoroprop-tetrafluoroeth, sodium chloride, traMADol

## 2022-10-06 NOTE — Progress Notes (Signed)
Received patient in bed,Awake,alert and oriented x 4.  Access used: Right HD catheter that worked well.Dressing changed done today.  Duration of treatment :3.25 hours.  Fluid removed: 2 liters.  Hemo issue/comment:None.  Hand off to the patient's nurse.

## 2022-10-06 NOTE — Progress Notes (Signed)
Physical Therapy Wound Treatment Patient Details  Name: Albert Harris MRN: [id number] Date of Birth: 1955/06/26  Today's Date: 10/06/2022 Time: 4098-1191 Time Calculation (min): 45 min  Subjective  Subjective Assessment Subjective: Pt pleasant and agreeable to wound therapy Patient and Family Stated Goals: Heal wound Date of Onset:  (unknown) Prior Treatments: Dressing changes  Pain Score:   Patient given both oral and IV pain medications prior to session; very painful with rolling/adjusting in bed; no pain noted with irrigation and debridement   Wound Assessment  Pressure Injury 08/28/22 Sacrum Medial Unstageable - Full thickness tissue loss in which the base of the injury is covered by slough (yellow, tan, gray, green or brown) and/or eschar (tan, brown or black) in the wound bed. Sacral wound with eschar coveri (Active)  Wound Image                 10/06/22 1450  Dressing Type Santyl;Gauze (Comment);Foam - Lift dressing to assess site every shift;Barrier Film (skin prep) 10/06/22 1450  Dressing Changed;Clean, Dry, Intact 10/06/22 1450  Dressing Change Frequency PRN 10/06/22 1450  State of Healing Eschar 10/06/22 1450  Site / Wound Assessment Black;Brown;Granulation tissue;Painful;Pale;Pink;Yellow 10/06/22 1450  % Wound base Red or Granulating 45% 10/06/22 1450  % Wound base Yellow/Fibrinous Exudate 35% 10/06/22 1450  % Wound base Black/Eschar 20% 10/06/22 1450  % Wound base Other/Granulation Tissue (Comment) 0% 10/06/22 1450  Peri-wound Assessment Erythema (non-blanchable) 10/06/22 1450  Wound Length (cm) 11.2 cm 10/06/22 1450  Wound Width (cm) 13.3 cm 10/06/22 1450  Wound Depth (cm) 3.4 cm 10/06/22 1450  Wound Surface Area (cm^2) 148.96 cm^2 10/06/22 1450  Wound Volume (cm^3) 506.46 cm^3 10/06/22 1450  Tunneling (cm) 0 09/29/22 1327  Undermining (cm) at 3:00 2.8 cm; at 11:00 2.8 cm (lying on rt side) 10/06/22 1450  Margins Unattached edges (unapproximated)  10/06/22 1450  Drainage Amount Minimal 10/06/22 1450  Drainage Description Serosanguineous;Odor - foul 10/06/22 1450  Treatment Debridement (Selective);Irrigation;Packing (Saline gauze) 10/06/22 1450      Selective Debridement (non-excisional) Selective Debridement (non-excisional) - Location: Sacrum Selective Debridement (non-excisional) - Tools Used: Forceps, Scalpel Selective Debridement (non-excisional) - Tissue Removed: yellow and black nonviable tissue    Wound Assessment and Plan  Wound Therapy - Assess/Plan/Recommendations Wound Therapy - Clinical Statement: No progression to decrease the amount of necrotic tissue and increase granulation tissue since last weeks measurements. Patient  is very ill and may not make progress towards healing wound even with hydrotherapy. Santyl initiated in past 5 days. Will continue to follow and hopeful that enzymatic debridement will improve ability to remove non-viable tissue. Wound Therapy - Functional Problem List: Decreased tolerance for repositioning and OOB due to wound; global weakness in the setting of extended hospitalization and now ICU stay. Factors Delaying/Impairing Wound Healing: Immobility, Multiple medical problems, Polypharmacy Hydrotherapy Plan: Debridement, Dressing change, Patient/family education Wound Therapy - Frequency: 2X / week Wound Therapy - Follow Up Recommendations: dressing changes by RN  Wound Therapy Goals- Improve the function of patient's integumentary system by progressing the wound(s) through the phases of wound healing (inflammation - proliferation - remodeling) by: Wound Therapy Goals - Improve the function of patient's integumentary system by progressing the wound(s) through the phases of wound healing by: Decrease Necrotic Tissue to: 20% Decrease Necrotic Tissue - Progress: Not progressing Increase Granulation Tissue to: 80% Increase Granulation Tissue - Progress: Not progressing Goals/treatment  plan/discharge plan were made with and agreed upon by patient/family: Yes Time For Goal Achievement: 7 days Wound  Therapy - Potential for Goals: Fair  Goals will be updated until maximal potential achieved or discharge criteria met.  Discharge criteria: when goals achieved, discharge from hospital, MD decision/surgical intervention, no progress towards goals, refusal/missing three consecutive treatments without notification or medical reason.  GP     Charges PT Wound Care Charges $Wound Debridement up to 20 cm: < or equal to 20 cm $ Wound Debridement each add'l 20 sqcm: 3 $PT Hydrotherapy Visit: 1 Visit   [name], PT Acute Rehabilitation Services  Office [phone]       [name] 10/06/2022, 3:00 PM

## 2022-10-06 NOTE — Progress Notes (Signed)
Daily Progress Note   Patient Name: Albert Harris       Date: 10/06/2022 DOB: [birth year]  Age: 67 y.o. MRN#: [id number] Attending Physician: Lewie Chamber, MD Primary Care Physician: [name], NP Admit Date: 08/28/2022 Length of Stay: 39 days  Reason for Consultation/Follow-up: Establishing goals of care  HPI/Patient Profile:  67 y.o. male  with past medical history of ESRD on HD, peripheral vascular disease, chronic systolic CHF with EF 35 to 40%, and COPD.  He presented to [facility] on 08/28/2022 with abdominal pain, nausea, and unable to feel his left foot.  He was found to have a left femoral pseudoaneurysm and was taken to the OR for repair of his distal left external iliac artery.  He required emergent CRRT due to hypotension and hyperkalemia.  Hospitalization has been complicated by MRSA bacteremia secondary to post-op wound infection and recurrent rectal bleeding requiring multiple transfusions.    Palliative Medicine was consulted for goals of care in the setting of ESRD, severe vascular disease, and bleeding.  Subjective:   Subjective: Chart Reviewed. Updates received. Patient Assessed. Created space and opportunity for patient  and family to explore thoughts and feelings regarding current medical situation.  Today's Discussion: Today I saw the patient at the bedside.  His oldest daughter [name] was present.  The patient was eating his lunch and was 75% complete.  He shares that his appetite is remain good.  He is not having pain currently, no nausea or vomiting.  Also denies dyspnea.  We discussed the plan for family discussions.  He did not really seem to want to talk about this with his daughter present.  I offered again that palliative medicine is available to support and help facilitate these discussions if wanted but he again declines.  I encouraged him to have these discussions with his family.  I told him that we would continue to follow along while he is admitted and I  would likely see him back in a few days.  He verbalized understanding.  I provided emotional and general support through therapeutic listening, empathy, sharing of stories, therapeutic touch, and other techniques. I answered all questions and addressed all concerns to the best of my ability.  Review of Systems  Constitutional:        Denies pain in general  Respiratory:  Negative for cough, chest tightness and shortness of breath.   Cardiovascular:  Negative for chest pain.  Gastrointestinal:  Negative for abdominal pain, nausea and vomiting.    Objective:   Vital Signs:  BP 123/82 (BP Location: Left Arm) Comment: 123/82  Pulse 93   Temp 98.1 F (36.7 C) (Oral)   Resp 13   Ht 5' 7.99" (1.727 m)   Wt 85 kg   SpO2 97%   BMI 28.50 kg/m   Physical Exam: Physical Exam Vitals and nursing note reviewed.  Constitutional:      General: He is not in acute distress.    Appearance: He is ill-appearing.  HENT:     Head: Normocephalic and atraumatic.  Cardiovascular:     Rate and Rhythm: Normal rate.  Pulmonary:     Effort: Pulmonary effort is normal. No respiratory distress.  Abdominal:     General: Abdomen is flat.     Palpations: Abdomen is soft.  Skin:    General: Skin is warm and dry.  Neurological:     General: No focal deficit present.     Mental Status: He is alert.  Psychiatric:  Mood and Affect: Mood normal.        Behavior: Behavior normal.     Palliative Assessment/Data: 30%    Existing Vynca/ACP Documentation: Advanced directive signed 09/23/2022  Assessment & Plan:   Impression: Present on Admission:  (Resolved) Lactic acidosis  (Resolved) Metabolic encephalopathy  Lower GI bleed  SUMMARY OF RECOMMENDATIONS   Full code for now Continue repeated messaging on the seriousness of his illness Allow more time for further family discussion PMT available to assist w/ family discussions, pt continues to decline or offers Continue to encourage  discussion amongst family while he is able to make decisions In the meantime continue full scope of care PMT will follow-up 10/09/2022  Symptom Management:  Per primary team PMT is available to assist as needed  Code Status: Full code  Prognosis: Unable to determine  Discharge Planning: To Be Determined  Discussed with: Patient, patient's family, medical team, nursing team  Thank you for allowing [place] to participate in the care of RAGHAV VERRILLI PMT will continue to support holistically.  Billing based on MDM: Moderate  Wynne Dust, NP Palliative Medicine Team  Team Phone # [phone] (Nights/Weekends)  02/04/2021, 8:17 AM

## 2022-10-06 NOTE — Consult Note (Addendum)
WOC consult requested to change Vac dressing either today or tomorrow. NPWT to surgical groin was changed yesterday; refer to previous consult notes for assessment.  Secure chat message was sent to the vascular team to discuss plan of care. WOC team will plan to change again Thursday. Thank-you,  Cammie Mcgee MSN, RN, CWOCN, [place], CNS [phone]

## 2022-10-07 DIAGNOSIS — R6521 Severe sepsis with septic shock: Secondary | ICD-10-CM | POA: Diagnosis not present

## 2022-10-07 DIAGNOSIS — A419 Sepsis, unspecified organism: Secondary | ICD-10-CM | POA: Diagnosis not present

## 2022-10-07 DIAGNOSIS — E872 Acidosis, unspecified: Secondary | ICD-10-CM | POA: Diagnosis not present

## 2022-10-07 DIAGNOSIS — R1084 Generalized abdominal pain: Secondary | ICD-10-CM | POA: Diagnosis not present

## 2022-10-07 LAB — RENAL FUNCTION PANEL
Albumin: 1.7 g/dL — ABNORMAL LOW (ref 3.5–5.0)
Anion gap: 16 — ABNORMAL HIGH (ref 5–15)
BUN: 56 mg/dL — ABNORMAL HIGH (ref 8–23)
CO2: 24 mmol/L (ref 22–32)
Calcium: 8.4 mg/dL — ABNORMAL LOW (ref 8.9–10.3)
Chloride: 98 mmol/L (ref 98–111)
Creatinine, Ser: 4.42 mg/dL — ABNORMAL HIGH (ref 0.61–1.24)
GFR, Estimated: 14 mL/min — ABNORMAL LOW (ref >60)
Glucose, Bld: 88 mg/dL (ref 70–99)
Phosphorus: 3.2 mg/dL (ref 2.5–4.6)
Potassium: 4.6 mmol/L (ref 3.5–5.1)
Sodium: 138 mmol/L (ref 135–145)

## 2022-10-07 LAB — HEMOGLOBIN AND HEMATOCRIT, BLOOD
HCT: 23.2 % — ABNORMAL LOW (ref 39.0–52.0)
HCT: 23 % — ABNORMAL LOW (ref 39.0–52.0)
Hemoglobin: 7.4 g/dL — ABNORMAL LOW (ref 13.0–17.0)
Hemoglobin: 7.3 g/dL — ABNORMAL LOW (ref 13.0–17.0)

## 2022-10-07 MED ORDER — CHLORHEXIDINE GLUCONATE CLOTH 2 % EX PADS
6.0000 | MEDICATED_PAD | Freq: Every day | CUTANEOUS | Status: DC
  Administered 2022-10-08 – 2022-10-14 (×5): 6 via TOPICAL

## 2022-10-07 MED ORDER — OXYCODONE HCL ER 10 MG PO T12A: 10.0000 mg | EXTENDED_RELEASE_TABLET | Freq: Two times a day (BID) | ORAL | Status: DC

## 2022-10-07 MED ORDER — GABAPENTIN 300 MG PO CAPS
300.0000 mg | ORAL_CAPSULE | Freq: Every day | ORAL | Status: DC
  Administered 2022-10-07: 300 mg via ORAL
  Filled 2022-10-07: qty 1

## 2022-10-07 MED ORDER — OXYCODONE HCL ER 10 MG PO T12A
10.0000 mg | EXTENDED_RELEASE_TABLET | Freq: Two times a day (BID) | ORAL | Status: DC
  Administered 2022-10-07: 10 mg via ORAL
  Filled 2022-10-07 (×2): qty 1

## 2022-10-07 NOTE — Progress Notes (Signed)
  Progress Note    10/07/2022 7:29 AM 11 Days Post-Op  Subjective:  no major complaints this morning.Says he is actually feeling a little better this morning. Says left leg with some improvement in pain. Still with buttock pain from sacral decubitus   Vitals:   10/06/22 1945 10/06/22 2304  BP: (!) 164/79 (!) 165/70  Pulse: 89 75  Resp: 18 18  Temp: 98.1 F (36.7 C) 98.5 F (36.9 C)  SpO2: 99% 97%   Physical Exam: Cardiac:  regular Lungs:  non labored Incisions:  left groin with VAC to suction, good seal Extremities:  No palpable pulses in left foot. Increased ischemic changes to the left toes with dry gangrene. Palpable right DP Abdomen:  soft, non distended Neurologic: alert and oriented  CBC    Component Value Date/Time   WBC 13.4 (H) 10/06/2022 0914   RBC 2.36 (L) 10/06/2022 0914   HGB 7.4 (L) 10/07/2022 0500   HGB 10.4 (L) 06/18/2021 1431   HCT 23.2 (L) 10/07/2022 0500   HCT 31.6 (L) 06/18/2021 1431   PLT 226 10/06/2022 0914   PLT 142 (L) 06/18/2021 1431   MCV 98.7 10/06/2022 0914   MCV 96 06/18/2021 1431   MCH 30.9 10/06/2022 0914   MCHC 31.3 10/06/2022 0914   RDW 19.1 (H) 10/06/2022 0914   RDW 14.3 06/18/2021 1431   LYMPHSABS 1.3 09/30/2022 0803   LYMPHSABS 1.5 06/18/2021 1431   MONOABS 1.1 (H) 09/30/2022 0803   EOSABS 0.4 09/30/2022 0803   EOSABS 1.0 (H) 06/18/2021 1431   BASOSABS 0.1 09/30/2022 0803   BASOSABS 0.1 06/18/2021 1431    BMET    Component Value Date/Time   NA 138 10/07/2022 0500   NA 141 06/18/2021 1431   K 4.6 10/07/2022 0500   CL 98 10/07/2022 0500   CO2 24 10/07/2022 0500   GLUCOSE 88 10/07/2022 0500   BUN 56 (H) 10/07/2022 0500   BUN 20 06/18/2021 1431   CREATININE 4.42 (H) 10/07/2022 0500   CREATININE 1.15 02/19/2017 1049   CALCIUM 8.4 (L) 10/07/2022 0500   CALCIUM 8.5 (L) 06/10/2020 0900   GFRNONAA 14 (L) 10/07/2022 0500   GFRAA 6 (L) 03/04/2020 1045    INR    Component Value Date/Time   INR 1.2 08/12/2022 1501      Intake/Output Summary (Last 24 hours) at 10/07/2022 0729 Last data filed at 10/06/2022 1733 Gross per 24 hour  Intake 480 ml  Output 2000 ml  Net -1520 ml     Assessment/Plan:  67 y.o. male is s/p removal of infected left femoral patch, iliofemoral bypass, with sartorius muscle flap 09/09/22   LLE unchanged. Some progression of ischemic changes to the toes Continue multimodal pain control H&H stable VAC dressing change tomorrow 5/2 with WOC RN Continue Therapies as able Continuing palliative care discussions for goc moving forward   [facility] [phone] 10/07/2022 7:29 AM

## 2022-10-07 NOTE — Progress Notes (Addendum)
PT Cancellation Note  Patient Details Name: Albert Harris MRN: [id number] DOB: [birth year]   Cancelled Treatment:    Reason Eval/Treat Not Completed: (P) Other (comment) (per RN, pt getting a bath/hygiene assist, x4 staff members already present to assist for pt safety.) Will continue efforts per PT plan of care as schedule permits.   Fredric Slabach M Janie Capp 10/07/2022, 2:09 PM  Addendum 1545: PTA reattempt, pt refusing PT session due to pain/fatigue and not yet due for pain meds.

## 2022-10-07 NOTE — Progress Notes (Signed)
Progress Note   Patient: Albert Harris ZOX:[id number] DOB: 03-29-1956 DOA: 08/28/2022     40 DOS: the patient was seen and examined on 10/07/2022   Brief hospital course: 67 year old male with history of ESRD on HD TTS, PVD,COPD, essential hypertension, chronic systolic congestive heart failure with ejection fraction 35 to 40%, presented with abdominal pain nausea and diaphoresis. Patient was also hypotensive with a lactic acidosis and was admitted to ICU,with a pulseless left lower extremity was found to have femoral pseudoaneurysm.Culture came back with MRSA bacteremia-TEE neg for vegetations.  08/29/22: -Admitted to ICU-on vasopressors for septic shock -s/p OR with Dr. Karin Lieu for left femoral exploration with pseudoaneurysm repair,hematoma evacuation>showed dehiscence at proximal aspect of bovine pericardial patch at the suture line.  -Emergent CRRT for severe hyperkalemia 09/08/22 Transferred to [facility] service  09/09/22 CTA for bleeding on left groin: showed Thrombosis of LEFT CFA pseudoaneurysm with new 3.5 cm filling defect, consistent with arterial thrombus, extending from the PSA neck and into the distal CFA. This is at risk for distal embolization and potential acute LEFT lower extremity ischemia.New 8 mm distal LEFT EIA pseudoaneurysm, medial to the stent distal landing zone. Mild interval enlargement of retroperitoneal hematoma, in a craniocaudal dimension measuring 12.2 cm (previously 11.2 cm).No CTA evidence of active extravasation. Patent RIGHT femoral-to-popliteal graft and bilateral iliac stents.Severe burden of splanchnic arterial atherosclerosis, at risk for chronic mesenteric ischemia. 09/09/22: urgent OR:S/P left common iliac to profunda femoris bypass with ipsilateral nonreversed greater saphenous vein in anatomic tunnel, left common iliac artery angioplasty and stenting, left greater saphenous vein harvest, left sartorius muscle of left groin, left lower extremity angiography with  first-order cannulation Transferred to 2H post op 09/12/22 am: 2 BRBPR> s/p Flex sig>showed 5 rectal ulcers, not actively bleeding- stool brown.advised to avoid constipation, added Miralax BID 09/13/22 AM: again 2 large bloody BM ~ 500cc> got 1 unit PRBC  Assessment and Plan: Acute blood loss anemia from bleeding anal/rectal ulcers possibly from his Crohn's disease - Patient underwent flex sigmoidoscopy on 4/6 was found to have 5 nonbleeding ulcers.  He also underwent colonoscopy which showed multiple rectal ulcers with visible bleeding underwent hemostasis. - Patient developed massive rectal bleeding again on 4/18 - Patient is not a candidate for IR intervention due to severe peripheral vascular disease and anatomy - General surgery on board, was taken to the OR  on 4/20 and underwent S/p EUA and over sewing of ulcers and packing anal canal with gelfoam by Dr. Magnus Ivan. - continue budesonide - Per last surgical notes there may be no options for intervention should the patient have any hemodynamically significant bleeding.  See surgical note from Dr. Magnus Ivan 4/21.  Poor prognosis.  Strongly recommend DNR status. -Remains transfusion dependent and medical care now strongly being considered futile especially with even ongoing dialysis as patient now likely to be bedridden and progressive poor quality of life/functional status  -Pt had been resistant to discussing hospice or end of life. Palliative Care consulted   PVD  Left common femoral artery pseudoaneurysm S/p left groin hematoma evacuation, primary repair of the distal external iliac artery. Further complicated on 4/3 by left groin bleeding with new pseudoaneurysm and arterial thrombus. S/p iliofemoral bypass and removal of infected patch with sartorius muscle flap. Further management as per vascular surgery.  - Continue with wound VAC    MRSA bacteremia secondary to postop wound infection with MRSA and Enterococcus faecium. Infectious  disease note from 4/8 recommending daptomycin for 6 weeks followed by indefinite  oral suppression of antibiotics. Monitor CPK weekly TEE is negative for vegetations. Patient will need outpatient follow-up with infectious disease to determine oral antibiotics. - continue daptomycin for now; Last dose of IV antibiotics on 10/23/22   ESRD on HD Nephrology on board and dialysis via right IJ tunneled catheter. Further HD sessions as per nephrology -Nephrology following. Continuing to discuss DNR and hospice. Conversations on-going   Chronic systolic heart failure Last echocardiogram from January 2024 showed left ventricular ejection fraction of 35 to 40% Pulmonary hypertension Cannot restart antiplatelet agents at this time Fluid management as per dialysis.   Acute metabolic encephalopathy  - from uremia, acidosis and acute 4 mm right subinsular stroke   Retroperitoneal hemorrhage Appears to be stable    Moderate malnutrition - continue diet   Hypertension:  On metoprolol. Suspect a component of pain.  Continue with hydralazine as needed   COPD:  No wheezing heard.  On RA  Uncontrolled back pain -Continued on multiple narcotics with only limited effect -Have added 10mg  Oxycontin q12h for added pain control      Subjective: Complaining of uncontrolled back pains  Physical Exam: Vitals:   10/07/22 0841 10/07/22 1021 10/07/22 1108 10/07/22 1716  BP: (!) 175/76 129/69 (!) 140/61 (!) 163/75  Pulse:  91 89 78  Resp:   18 20  Temp:   98.2 F (36.8 C) 98.5 F (36.9 C)  TempSrc:   Oral Oral  SpO2:   98% 96%  Weight:      Height:       General exam: Awake, laying in bed, in nad Respiratory system: Normal respiratory effort, no wheezing Cardiovascular system: regular rate, s1, s2 Gastrointestinal system: Soft, nondistended, positive BS Central nervous system: CN2-12 grossly intact, strength intact Extremities: Perfused, no clubbing Skin: Normal skin turgor, no notable  skin lesions seen Psychiatry: Mood normal // no visual hallucinations   Data Reviewed:  Labs reviewed: na 138, K 4.6, Cr 4.42, Hgb 7.4  Family Communication: Pt in room, family at bedside  Disposition: Status is: Inpatient Remains inpatient appropriate because: severity of illness  Planned Discharge Destination:  Unclear at this time    Author: Rickey Barbara, MD 10/07/2022 5:39 PM  For on call review [email or web address].

## 2022-10-07 NOTE — Progress Notes (Signed)
[facility] Progress Note   Subjective:   seen in room, no new c/o  Objective Vitals:   10/07/22 0729 10/07/22 0841 10/07/22 1021 10/07/22 1108  BP: (!) 195/90 (!) 175/76 129/69 (!) 140/61  Pulse: 84  91 89  Resp: 17   18  Temp: 98.2 F (36.8 C)   98.2 F (36.8 C)  TempSrc: Oral   Oral  SpO2: 94%   98%  Weight:      Height:       Physical Exam General: Awake, alert,  On RA, NAD Heart: RRR; no murmur Lungs: CTA anteriorly Abdomen: soft Extremities: dep edema; L foot with ischemic distal 3rd-5th toes Dialysis Access: [facility] Dialysis Orders:  OP HD: TTS SW  4h   350/800  64.5kg  new TDC (RFA AVG clotted)  Heparin none  - last OP HD 3/14, post wt 65.9kg - venofer 50mg  IV weekly - rocaltrol 0.25 mcg po tiw - phoslo 2 ac tid - no esa, last Hb 11.0  Assessment/Plan: Recurrent lower GIB: S/p colonoscopy 09/15/22, then repeat sigmoidoscopy 4/11. Several bleeding ulcers addressed.  Path came back + Crohn's - started on mesalamine by GI. Brisk re-bleed 4/18, took again for emergent sigmoidoscopy with hemostatic gel applied to bleeding ulcer. Then bled again from his rectum/ anus requiring suturing of bleeding ulcers 4/20 by Dr. Magnus Ivan. This continues to be a problem but hgb stable the last 48 hours Left common femoral artery pseudoaneurysm - s/p L femoral exploration with pseudoaneurysm repair 08/29/22, then iliofemoral bypass, removal of infected patch with sartorius muscle flap on 09/09/22. Ischemic changes to L 3rd-5th toes persist.  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 Neuro signed off.  ESRD: sp short-term CRRT 3/23- 3/26, now back on iHD. HD tomorrow.   BP/volume: clear lungs, no edema on exam. Wts are inaccurate. Tolerating 1.5- 2 L usual UF, occ 3L UF.  Anemia (ESRD + ABLA): Last transfused 1U on 4/25 (total of 25U this admit). Also on  Aranesp q Wed (max dose). Hb 7- 8 range.  Secondary HPTH: CorrCa high but now at goal, Phos ok. Continue holding VDRA , continue phoslo for now. Dialysis access: AVG clotted 3/27, went for declot by VVS and then re-clotted. VVS placed R TDC on 4/1.  Hypoalbuminemia - less than 1.5, in hospital > 30 days. Palliative care has revisited EOL, see their notes. Dtr seems accepting but pt is not. Had discussion today describing potential options for DNR and /or hospice transition, but didn't really get much feedback and no questions were asked by the patient. Remains full code.   Albert Moselle, MD 10/07/2022, 1:30 PM  Recent Labs  Lab 10/06/22 0913 10/06/22 0914 10/06/22 1725 10/07/22 0500  HGB  --    < > 7.9* 7.4*  ALBUMIN 1.9*  --   --  1.7*  CALCIUM 8.7*  --   --  8.4*  PHOS 5.4*  --   --  3.2  CREATININE 7.03*  --   --  4.42*  K 4.9  --   --  4.6   < > = values in this interval not displayed.     Inpatient medications:  (feeding supplement) PROSource Plus  30 mL Oral TID WC   acetaminophen  650 mg Oral Q6H   ascorbic acid  250 mg Oral BID   budesonide  9 mg  Oral Daily   calcium acetate  1,334 mg Oral TID WC   Chlorhexidine Gluconate Cloth  6 each Topical Q0600   Chlorhexidine Gluconate Cloth  6 each Topical Q0600   collagenase   Topical Daily   darbepoetin (ARANESP) injection - DIALYSIS  200 mcg Subcutaneous Q Wed-1800   liver oil-zinc oxide   Topical TID   methocarbamol  500 mg Oral TID   metoprolol succinate  25 mg Oral Daily   multivitamin  1 tablet Oral QHS   nutrition supplement (JUVEN)  1 packet Oral BID BM   oxyCODONE  10 mg Oral Q12H   pantoprazole  40 mg Oral BID   polyethylene glycol  17 g Oral Daily   rosuvastatin  10 mg Oral Daily   sodium chloride flush  10-40 mL Intracatheter Q12H   vitamin A  10,000 Units Oral Daily    sodium chloride 0 mL/hr at 09/22/22 0757   DAPTOmycin (CUBICIN) 600 mg in sodium chloride 0.9 % IVPB 600 mg (10/06/22 1733)   DAPTOmycin  (CUBICIN) 750 mg in sodium chloride 0.9 % IVPB Stopped (10/03/22 1935)   sodium chloride     alteplase, dicyclomine, heparin, hydrALAZINE, hydrALAZINE, HYDROmorphone (DILAUDID) injection, labetalol, oxyCODONE, pentafluoroprop-tetrafluoroeth, sodium chloride, traMADol

## 2022-10-07 NOTE — Progress Notes (Signed)
Nutrition Follow-up  DOCUMENTATION CODES:   Non-severe (moderate) malnutrition in context of chronic illness  INTERVENTION:  Continue juven BID  Continue prosource TID  Encourage po intake  Continue Vitamin A  Continue Vitamin C  Continue rena-vit    NUTRITION DIAGNOSIS:   Moderate Malnutrition (in the context of chronic illness (ESRD)) related to poor appetite as evidenced by moderate fat depletion, severe muscle depletion.   GOAL:   Patient will meet greater than or equal to 90% of their needs -ongoing  MONITOR:   PO intake, Supplement acceptance, I & O's, Labs, Weight trends  REASON FOR ASSESSMENT:   Consult, Ventilator Enteral/tube feeding initiation and management (trickle tube feeds)  ASSESSMENT:   67 y.o. male admitted with abdominal pain, nausea, diaphoresis and unable to feel L foot d/t L femoral pseudoaneurysm. Pt was hypotensive with lactic acidosis upon admission. PMH significant for ESRD, PAD, COPD, HLD, HTN, HFrEF 35-40%.  Visited patient at bedside who reports he is eating well and has a good appetite. He had a few complaints about the food. He has been compliant with ONS although he dislikes the taste. No N/V/C. He reports diarrhea when "they give me that stuff", RD assumes he is referring to miralax.   Labs: BUN 56, Cr 4.42 Meds: vitamin c, phoslo, budesonide, miralax, crestor, NS, vitamin A, rena-vit  I/O's: -2 L   Diet Order:   Diet Order             Diet regular Room service appropriate? Yes; Fluid consistency: Thin; Fluid restriction: 1200 mL Fluid  Diet effective now                   EDUCATION NEEDS:   Education needs have been addressed  Skin:  Skin Assessment: Skin Integrity Issues: Skin Integrity Issues:: Unstageable, Other (Comment) DTI: R/L heel Stage II: n/a Unstageable: sacrum (DTI that evlolved into unstageable)- receiving hydrotherapy, 8 cms x 13 cms x 1 cm 80% black tan devitalized tissue 20% pink moist Wound Vac: L  groin Incisions: L groin (closed) Other: Buttocks and gluteal crease/upper thighs with scattered areas of partial thickness and full thickness skin loss  Last BM:  5/1  Height:   Ht Readings from Last 1 Encounters:  09/26/22 5' 7.99" (1.727 m)    Weight:   Wt Readings from Last 1 Encounters:  10/02/22 85 kg    Ideal Body Weight:  70 kg  BMI:  Body mass index is 28.5 kg/m.  Estimated Nutritional Needs:   Kcal:  1900-2100  Protein:  110-130 g  Fluid:  1L + UOP    Leodis Rains, RDN, LDN  Clinical Nutrition

## 2022-10-08 DIAGNOSIS — A419 Sepsis, unspecified organism: Secondary | ICD-10-CM | POA: Diagnosis not present

## 2022-10-08 DIAGNOSIS — R6521 Severe sepsis with septic shock: Secondary | ICD-10-CM | POA: Diagnosis not present

## 2022-10-08 DIAGNOSIS — R1084 Generalized abdominal pain: Secondary | ICD-10-CM | POA: Diagnosis not present

## 2022-10-08 LAB — RENAL FUNCTION PANEL
Albumin: 1.8 g/dL — ABNORMAL LOW (ref 3.5–5.0)
Anion gap: 18 — ABNORMAL HIGH (ref 5–15)
BUN: 94 mg/dL — ABNORMAL HIGH (ref 8–23)
CO2: 22 mmol/L (ref 22–32)
Calcium: 9.3 mg/dL (ref 8.9–10.3)
Chloride: 96 mmol/L — ABNORMAL LOW (ref 98–111)
Creatinine, Ser: 5.99 mg/dL — ABNORMAL HIGH (ref 0.61–1.24)
GFR, Estimated: 10 mL/min — ABNORMAL LOW (ref >60)
Glucose, Bld: 98 mg/dL (ref 70–99)
Phosphorus: 4.7 mg/dL — ABNORMAL HIGH (ref 2.5–4.6)
Potassium: 5.6 mmol/L — ABNORMAL HIGH (ref 3.5–5.1)
Sodium: 136 mmol/L (ref 135–145)

## 2022-10-08 LAB — HEMOGLOBIN AND HEMATOCRIT, BLOOD
HCT: 25.4 % — ABNORMAL LOW (ref 39.0–52.0)
HCT: 25.4 % — ABNORMAL LOW (ref 39.0–52.0)
Hemoglobin: 7.8 g/dL — ABNORMAL LOW (ref 13.0–17.0)
Hemoglobin: 8.1 g/dL — ABNORMAL LOW (ref 13.0–17.0)

## 2022-10-08 MED ORDER — ALTEPLASE 2 MG IJ SOLR
2.0000 mg | Freq: Once | INTRAMUSCULAR | Status: DC
  Filled 2022-10-08: qty 2

## 2022-10-08 MED ORDER — HEPARIN SODIUM (PORCINE) 1000 UNIT/ML IJ SOLN
INTRAMUSCULAR | Status: AC
  Administered 2022-10-08: 3200 [IU]
  Filled 2022-10-08: qty 4

## 2022-10-08 NOTE — Consult Note (Signed)
WOC Nurse wound follow up Wound type:surgical, left groin.  NPWT routine dressing change Measurement:6.5cm x 3.5cm x 1.2cm. Linear extension to left iliac crest has nearly resurfaced. No need for NPWT to this area. It will be protected with DermaTac drape. Wound bed: Red, moist.10% yellow tissue 90% red, granulating tissue Drainage (amount, consistency, odor) serous to scant serosanguinous Periwound:intact, no maceration. Dressing procedure/placement/frequency: Dressing removed, 1 piece black foam. Wound cleansed with NS and patted dry. One skin barrier ring stretched and used to encircle wound at groin for periwound skin protection and to enhance NPWT seal. One (1) piece of black foam used to fill dead space and this is covered with DermaTac drape. Dressing attached to continuous negative pressure and an immediate seal is achieved. Dressing is labled and dated, measurements and # and type of wound contact layers are indicated.  Next dressing change is due on Monday, 09/12/2022. One new dressing kit and one skin barrier ring are in the room for this change.  WOC nursing team will follow, and will remain available to this patient, the nursing and medical teams.   Ladona Mow, MSN, RN, CNS, GNP, Leda Min, [employer], [employer] phone:  [phone]

## 2022-10-08 NOTE — Progress Notes (Addendum)
Received patient in bed.Awake,alert and oriented x 4.  Access used :    Hd catheter that worked well.Dressing on date.  Duration of treatment : 3.5 hours.  Fluid removed : 2,100 out of 2. 5 liters prescribed.  Hemo issue: Unable to achieved prescribed goal,for patient's blood pressure were soft during the last hour of his treatment.  Hand off to the patient's nurse.

## 2022-10-08 NOTE — Progress Notes (Signed)
[facility] Progress Note   Subjective:   seen in HD, no c/o's today. 2.1 L off.   Objective Vitals:   10/08/22 1030 10/08/22 1100 10/08/22 1135 10/08/22 1233  BP: 111/70 92/66 (!) 118/56 134/71  Pulse:   94 99  Resp: 17 (!) 21 (!) 24 18  Temp:   98 F (36.7 C) 98.2 F (36.8 C)  TempSrc:    Oral  SpO2:   (!) 87% 93%  Weight:   78 kg   Height:       Physical Exam General: Awake, alert,  On RA, NAD Heart: RRR; no murmur Lungs: CTA anteriorly Abdomen: soft Extremities: dep edema; L foot with ischemic distal 3rd-5th toes Dialysis Access: [facility] Dialysis Orders:  OP HD: TTS SW  4h   350/800  64.5kg  new TDC (RFA AVG clotted)  Heparin none  - last OP HD 3/14, post wt 65.9kg - venofer 50mg  IV weekly - rocaltrol 0.25 mcg po tiw - phoslo 2 ac tid - no esa, last Hb 11.0  Assessment/Plan: Recurrent lower GIB: S/p colonoscopy 09/15/22, then repeat sigmoidoscopy 4/11. Several bleeding ulcers addressed.  Path came back + Crohn's - started on mesalamine by GI. Brisk re-bleed 4/18, took again for emergent sigmoidoscopy with hemostatic gel applied to bleeding ulcer. Then bled again from his rectum/ anus requiring suturing of bleeding ulcers 4/20 by Dr. Magnus Ivan. Hb stable for last few days now.  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 SP iliofemoral bypass, removal of infected patch with sartorius muscle flap 09/09/22. Ischemic changes to L 3rd-5th toes persist.  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 Neuro signed off.  ESRD: sp short-term CRRT 3/23- 3/26, now back on iHD. HD today.  BP/volume: clear lungs, no edema on exam. Wts are inaccurate. Tolerating 1.5- 2 L usual UF.  Anemia (ESRD + ABLA): Last transfused 1U on 4/25 (total of 25U this admit). Also on Aranesp q Wed (max dose). Hb  7- 8 range.  Secondary HPTH: CorrCa high but now at goal, Phos ok. Continue holding VDRA , continue phoslo for now. Dialysis access: AVG clotted 3/27, went for declot by VVS and then re-clotted. VVS placed R TDC on 4/1.  Hypoalbuminemia - less than 1.5, in hospital > 30 days. Palliative care has revisited, see their notes. Pt has not been responsive to suggestions like DNR, etc. Remains full code.   Vinson Moselle, MD 10/08/2022, 4:07 PM  Recent Labs  Lab 10/07/22 0500 10/07/22 1700 10/08/22 0753  HGB 7.4* 7.3* 7.8*  ALBUMIN 1.7*  --  1.8*  CALCIUM 8.4*  --  9.3  PHOS 3.2  --  4.7*  CREATININE 4.42*  --  5.99*  K 4.6  --  5.6*     Inpatient medications:  (feeding supplement) PROSource Plus  30 mL Oral TID WC   acetaminophen  650 mg Oral Q6H   ascorbic acid  250 mg Oral BID   budesonide  9 mg Oral Daily   calcium acetate  1,334 mg Oral TID WC   Chlorhexidine Gluconate Cloth  6 each Topical Q0600   collagenase   Topical Daily   darbepoetin (ARANESP) injection - DIALYSIS  200 mcg Subcutaneous Q Wed-1800   liver oil-zinc oxide   Topical TID   methocarbamol  500 mg Oral TID   metoprolol  succinate  25 mg Oral Daily   multivitamin  1 tablet Oral QHS   nutrition supplement (JUVEN)  1 packet Oral BID BM   pantoprazole  40 mg Oral BID   polyethylene glycol  17 g Oral Daily   rosuvastatin  10 mg Oral Daily   sodium chloride flush  10-40 mL Intracatheter Q12H   vitamin A  10,000 Units Oral Daily    sodium chloride 0 mL/hr at 09/22/22 0757   DAPTOmycin (CUBICIN) 600 mg in sodium chloride 0.9 % IVPB 600 mg (10/06/22 1733)   DAPTOmycin (CUBICIN) 750 mg in sodium chloride 0.9 % IVPB Stopped (10/03/22 1935)   sodium chloride     alteplase, dicyclomine, heparin, hydrALAZINE, hydrALAZINE, HYDROmorphone (DILAUDID) injection, labetalol, oxyCODONE, pentafluoroprop-tetrafluoroeth, sodium chloride, traMADol

## 2022-10-08 NOTE — Progress Notes (Signed)
PT Cancellation Note  Patient Details Name: ANANIAS KOLANDER MRN: [id number] DOB: [birth year]   Cancelled Treatment:    Reason Eval/Treat Not Completed: (P) Patient at procedure or test/unavailable (pt off unit at HD dept.) Will continue efforts per PT plan of care as schedule permits.  Dorathy Kinsman Kamri Gotsch 10/08/2022, 9:58 AM

## 2022-10-08 NOTE — Progress Notes (Signed)
Progress Note   Patient: Albert Harris ZOX:[id number] DOB: 1956/03/07 DOA: 08/28/2022     41 DOS: the patient was seen and examined on 10/08/2022   Brief hospital course: 67 year old male with history of ESRD on HD TTS, PVD,COPD, essential hypertension, chronic systolic congestive heart failure with ejection fraction 35 to 40%, presented with abdominal pain nausea and diaphoresis. Patient was also hypotensive with a lactic acidosis and was admitted to ICU,with a pulseless left lower extremity was found to have femoral pseudoaneurysm.Culture came back with MRSA bacteremia-TEE neg for vegetations.  08/29/22: -Admitted to ICU-on vasopressors for septic shock -s/p OR with Dr. Karin Lieu for left femoral exploration with pseudoaneurysm repair,hematoma evacuation>showed dehiscence at proximal aspect of bovine pericardial patch at the suture line.  -Emergent CRRT for severe hyperkalemia 09/08/22 Transferred to [facility] service  09/09/22 CTA for bleeding on left groin: showed Thrombosis of LEFT CFA pseudoaneurysm with new 3.5 cm filling defect, consistent with arterial thrombus, extending from the PSA neck and into the distal CFA. This is at risk for distal embolization and potential acute LEFT lower extremity ischemia.New 8 mm distal LEFT EIA pseudoaneurysm, medial to the stent distal landing zone. Mild interval enlargement of retroperitoneal hematoma, in a craniocaudal dimension measuring 12.2 cm (previously 11.2 cm).No CTA evidence of active extravasation. Patent RIGHT femoral-to-popliteal graft and bilateral iliac stents.Severe burden of splanchnic arterial atherosclerosis, at risk for chronic mesenteric ischemia. 09/09/22: urgent OR:S/P left common iliac to profunda femoris bypass with ipsilateral nonreversed greater saphenous vein in anatomic tunnel, left common iliac artery angioplasty and stenting, left greater saphenous vein harvest, left sartorius muscle of left groin, left lower extremity angiography with  first-order cannulation Transferred to 2H post op 09/12/22 am: 2 BRBPR> s/p Flex sig>showed 5 rectal ulcers, not actively bleeding- stool brown.advised to avoid constipation, added Miralax BID 09/13/22 AM: again 2 large bloody BM ~ 500cc> got 1 unit PRBC  Assessment and Plan: Acute blood loss anemia from bleeding anal/rectal ulcers possibly from his Crohn's disease - Patient underwent flex sigmoidoscopy on 4/6 was found to have 5 nonbleeding ulcers.  He also underwent colonoscopy which showed multiple rectal ulcers with visible bleeding underwent hemostasis. - Patient developed massive rectal bleeding again on 4/18 - Patient is not a candidate for IR intervention due to severe peripheral vascular disease and anatomy - General surgery on board, was taken to the OR  on 4/20 and underwent S/p EUA and over sewing of ulcers and packing anal canal with gelfoam by Dr. Magnus Ivan. - continue budesonide - Per last surgical notes there may be no options for intervention should the patient have any hemodynamically significant bleeding.  See surgical note from Dr. Magnus Ivan 4/21.  Poor prognosis.  Strongly recommend DNR status. -Remains transfusion dependent and medical care now strongly being considered futile especially with even ongoing dialysis as patient now likely to be bedridden and progressive poor quality of life/functional status  -Pt had been resistant to discussing hospice or end of life. Palliative Care consulted and is following   PVD  Left common femoral artery pseudoaneurysm S/p left groin hematoma evacuation, primary repair of the distal external iliac artery. Further complicated on 4/3 by left groin bleeding with new pseudoaneurysm and arterial thrombus. S/p iliofemoral bypass and removal of infected patch with sartorius muscle flap. Further management as per vascular surgery.  - Continue with wound VAC    MRSA bacteremia secondary to postop wound infection with MRSA and Enterococcus  faecium. Infectious disease note from 4/8 recommending daptomycin for 6 weeks  followed by indefinite oral suppression of antibiotics. Monitor CPK weekly TEE is negative for vegetations. Patient will need outpatient follow-up with infectious disease to determine oral antibiotics. - continue daptomycin for now; Last dose of IV antibiotics on 10/23/22   ESRD on HD Nephrology on board and dialysis via right IJ tunneled catheter. Further HD sessions as per nephrology -Nephrology following. Continuing to discuss DNR and hospice. Conversations on-going   Chronic systolic heart failure Last echocardiogram from January 2024 showed left ventricular ejection fraction of 35 to 40% Pulmonary hypertension Cannot restart antiplatelet agents at this time Fluid management as per dialysis.   Acute metabolic encephalopathy  - from uremia, acidosis and acute 4 mm right subinsular stroke   Retroperitoneal hemorrhage Appears to be stable    Moderate malnutrition - continue diet   Hypertension:  On metoprolol. Suspect a component of pain.  Continue with hydralazine as needed   COPD:  No wheezing heard.  On RA  Uncontrolled back pain -Continued on multiple narcotics  -recently seen on 5/1 with pt in marked pain. Gave trial of oxycontin 10mg  q12h.  -Per staff, pt was noted to be more lethargic, thus did not receive any more -Today, pt noted to be more encephalopathic. Held further oxycontin. -Pt seems much more comfortable. Advised pt to limit narcotics to only if absolutely needed      Subjective: Seems more comfortable today. Without complaints, but seems more confused  Physical Exam: Vitals:   10/08/22 1100 10/08/22 1135 10/08/22 1233 10/08/22 1628  BP: 92/66 (!) 118/56 134/71 (!) 132/59  Pulse:  94 99 98  Resp: (!) 21 (!) 24 18 18   Temp:  98 F (36.7 C) 98.2 F (36.8 C) 98.6 F (37 C)  TempSrc:   Oral Oral  SpO2:  (!) 87% 93% 90%  Weight:  78 kg    Height:       General exam:  Conversant, in no acute distress Respiratory system: normal chest rise, clear, no audible wheezing Cardiovascular system: regular rhythm, s1-s2 Gastrointestinal system: Nondistended, nontender, pos BS Central nervous system: No seizures, no tremors Extremities: No cyanosis, no joint deformities Skin: No rashes, no pallor Psychiatry: Affect normal // no auditory hallucinations   Data Reviewed:  Labs reviewed: Na 136, K 5.6, Cr 5.99, Hgb 7.8  Family Communication: Pt in room, family at bedside  Disposition: Status is: Inpatient Remains inpatient appropriate because: severity of illness  Planned Discharge Destination:  Unclear at this time    Author: Rickey Barbara, MD 10/08/2022 5:20 PM  For on call review [email or web address].

## 2022-10-09 DIAGNOSIS — R1084 Generalized abdominal pain: Secondary | ICD-10-CM | POA: Diagnosis not present

## 2022-10-09 DIAGNOSIS — A419 Sepsis, unspecified organism: Secondary | ICD-10-CM | POA: Diagnosis not present

## 2022-10-09 DIAGNOSIS — E872 Acidosis, unspecified: Secondary | ICD-10-CM | POA: Diagnosis not present

## 2022-10-09 DIAGNOSIS — M79605 Pain in left leg: Secondary | ICD-10-CM | POA: Diagnosis not present

## 2022-10-09 LAB — HEMOGLOBIN AND HEMATOCRIT, BLOOD
HCT: 26.1 % — ABNORMAL LOW (ref 39.0–52.0)
HCT: 24.5 % — ABNORMAL LOW (ref 39.0–52.0)
Hemoglobin: 8.1 g/dL — ABNORMAL LOW (ref 13.0–17.0)
Hemoglobin: 7.9 g/dL — ABNORMAL LOW (ref 13.0–17.0)

## 2022-10-09 MED ORDER — CHLORHEXIDINE GLUCONATE CLOTH 2 % EX PADS
6.0000 | MEDICATED_PAD | Freq: Every day | CUTANEOUS | Status: DC
  Administered 2022-10-10 – 2022-10-14 (×4): 6 via TOPICAL

## 2022-10-09 NOTE — Progress Notes (Signed)
[facility] Progress Note   Subjective:   seen in room. No /co's.  Objective Vitals:   10/08/22 1628 10/08/22 2341 10/09/22 0341 10/09/22 0725  BP: (!) 132/59 138/73 134/71 (!) 148/67  Pulse: 98   85  Resp: 18 20 19 19   Temp: 98.6 F (37 C) 98.5 F (36.9 C) 98.3 F (36.8 C) 98.3 F (36.8 C)  TempSrc: Oral Oral Oral Oral  SpO2: 90%   100%  Weight:      Height:       Physical Exam General: Awake, alert,  On RA, NAD, chronically ill appearing Heart: RRR; no murmur Lungs: CTA anteriorly Abdomen: soft ntnd Back: large, deep wound over sacrum about 5" x 3.5" Extremities: no LE or UE edema; L foot with ischemic distal 1st -5th toes Dialysis Access: TDC  OP HD: TTS SW  4h   350/800  64.5kg  new TDC (RFA AVG clotted)  Heparin none  - last OP HD 3/14, post wt 65.9kg - venofer 50mg  IV weekly - rocaltrol 0.25 mcg po tiw - phoslo 2 ac tid - no esa, last Hb 11.0  Assessment/Plan: Recurrent lower GIB: S/p colonoscopy 09/15/22, then repeat sigmoidoscopy 4/11. Several bleeding ulcers addressed.  Path came back + Crohn's - started on mesalamine by GI. Brisk re-bleed 4/18, took again for emergent sigmoidoscopy with hemostatic gel applied to bleeding ulcer. Then bled again from his rectum/ anus requiring suturing of bleeding ulcers 4/20 by Dr. Magnus Ivan. Hb stable for last few days now.  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 SP iliofemoral bypass, removal of infected patch with sartorius muscle flap 09/09/22. Ischemic changes to L 3rd-5th toes persist.  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 Neuro signed off.  ESRD: sp short-term CRRT 3/23- 3/26, now back on iHD. HD tomorrow.  BP/volume: clear lungs, no edema on exam. Wts are inaccurate. Tolerating 1.5- 2 L usual UF.  Anemia (ESRD + ABLA):  Last transfused 1U on 4/25 (total of 25U this admit). Also on Aranesp q Wed (max dose). Hb 7- 8 range.  Secondary HPTH: CorrCa high but now at goal, Phos ok. Continue holding VDRA , continue phoslo for now. Dialysis access: AVG clotted 3/27, went for declot by VVS and then re-clotted. VVS placed R TDC on 4/1.  Hypoalbuminemia - less than 1.5, in hospital > 30 days. Palliative care has revisited, see their notes. Pt has not been responsive to suggestions like DNR, etc. Remains full code.   Vinson Moselle, MD 10/09/2022, 3:23 PM  Recent Labs  Lab 10/07/22 0500 10/07/22 1700 10/08/22 0753 10/08/22 1923 10/09/22 0640  HGB 7.4*   < > 7.8* 8.1* 8.1*  ALBUMIN 1.7*  --  1.8*  --   --   CALCIUM 8.4*  --  9.3  --   --   PHOS 3.2  --  4.7*  --   --   CREATININE 4.42*  --  5.99*  --   --   K 4.6  --  5.6*  --   --    < > = values in this interval not displayed.     Inpatient medications:  (feeding supplement) PROSource Plus  30 mL Oral TID WC   acetaminophen  650 mg Oral Q6H   ascorbic acid  250 mg Oral BID   budesonide  9 mg Oral Daily   calcium acetate  1,334 mg Oral TID WC   Chlorhexidine Gluconate Cloth  6 each Topical Q0600   collagenase   Topical Daily   darbepoetin (ARANESP) injection - DIALYSIS  200 mcg Subcutaneous Q Wed-1800   liver oil-zinc oxide   Topical TID   methocarbamol  500 mg Oral TID   metoprolol succinate  25 mg Oral Daily   multivitamin  1 tablet Oral QHS   nutrition supplement (JUVEN)  1 packet Oral BID BM   pantoprazole  40 mg Oral BID   polyethylene glycol  17 g Oral Daily   rosuvastatin  10 mg Oral Daily   sodium chloride flush  10-40 mL Intracatheter Q12H   vitamin A  10,000 Units Oral Daily    sodium chloride 0 mL/hr at 09/22/22 0757   DAPTOmycin (CUBICIN) 600 mg in sodium chloride 0.9 % IVPB 600 mg (10/08/22 2008)   DAPTOmycin (CUBICIN) 750 mg in sodium chloride 0.9 % IVPB Stopped (10/03/22 1935)   sodium chloride     alteplase, dicyclomine,  heparin, hydrALAZINE, hydrALAZINE, HYDROmorphone (DILAUDID) injection, labetalol, oxyCODONE, pentafluoroprop-tetrafluoroeth, sodium chloride, traMADol

## 2022-10-09 NOTE — Progress Notes (Signed)
  Progress Note    10/09/2022 8:23 AM 13 Days Post-Op  Subjective:  no major complaints this morning.eating breakfast   Vitals:   10/09/22 0341 10/09/22 0725  BP: 134/71 (!) 148/67  Pulse:  85  Resp: 19 19  Temp: 98.3 F (36.8 C) 98.3 F (36.8 C)  SpO2:  100%   Physical Exam: Cardiac:  regular Lungs:  non labored Incisions:  left groin with VAC to suction, good seal Extremities:  No palpable pulses in left foot. Increased ischemic changes to the left toes with dry gangrene. Palpable right DP Abdomen:  soft, non distended Neurologic: alert and oriented  CBC    Component Value Date/Time   WBC 13.4 (H) 10/06/2022 0914   RBC 2.36 (L) 10/06/2022 0914   HGB 8.1 (L) 10/09/2022 0640   HGB 10.4 (L) 06/18/2021 1431   HCT 26.1 (L) 10/09/2022 0640   HCT 31.6 (L) 06/18/2021 1431   PLT 226 10/06/2022 0914   PLT 142 (L) 06/18/2021 1431   MCV 98.7 10/06/2022 0914   MCV 96 06/18/2021 1431   MCH 30.9 10/06/2022 0914   MCHC 31.3 10/06/2022 0914   RDW 19.1 (H) 10/06/2022 0914   RDW 14.3 06/18/2021 1431   LYMPHSABS 1.3 09/30/2022 0803   LYMPHSABS 1.5 06/18/2021 1431   MONOABS 1.1 (H) 09/30/2022 0803   EOSABS 0.4 09/30/2022 0803   EOSABS 1.0 (H) 06/18/2021 1431   BASOSABS 0.1 09/30/2022 0803   BASOSABS 0.1 06/18/2021 1431    BMET    Component Value Date/Time   NA 136 10/08/2022 0753   NA 141 06/18/2021 1431   K 5.6 (H) 10/08/2022 0753   CL 96 (L) 10/08/2022 0753   CO2 22 10/08/2022 0753   GLUCOSE 98 10/08/2022 0753   BUN 94 (H) 10/08/2022 0753   BUN 20 06/18/2021 1431   CREATININE 5.99 (H) 10/08/2022 0753   CREATININE 1.15 02/19/2017 1049   CALCIUM 9.3 10/08/2022 0753   CALCIUM 8.5 (L) 06/10/2020 0900   GFRNONAA 10 (L) 10/08/2022 0753   GFRAA 6 (L) 03/04/2020 1045    INR    Component Value Date/Time   INR 1.2 08/12/2022 1501     Intake/Output Summary (Last 24 hours) at 10/09/2022 1610 Last data filed at 10/08/2022 1135 Gross per 24 hour  Intake --  Output 2100  ml  Net -2100 ml      Assessment/Plan:  67 y.o. male is s/p removal of infected left femoral patch, iliofemoral bypass, with sartorius muscle flap 09/09/22   LLE unchanged. Some progression of ischemic changes to the toes Continue multimodal pain control H&H stable VAC dressing change tomorrow 5/2 with WOC RN - appreciate their help Continue Therapies as able Continuing palliative care discussions for goc moving forward The sacral wound remains a major concern due to poor perfusion for wound healing.    Victorino Sparrow MD [facility] [phone] 10/09/2022 8:23 AM

## 2022-10-09 NOTE — Progress Notes (Signed)
OT Cancellation Note  Patient Details Name: Albert Harris MRN: [id number] DOB: January 28, 1956   Cancelled Treatment:    Reason Eval/Treat Not Completed: Fatigue/lethargy limiting ability to participate.  Attempted skilled OT treatment session.  Pt.s dtr. [name] present and reports he has been sleeping most of the morning.  Able to briefly awake pt. He sates he "just can't do it today".  When asked why he expressed that he was very tired.  Reviewed with pt. And dtr. When he wakes up later the benefits of completed b ue hep and performing any adls he is able to continue to gain activity tolerance and strength as able.  They both verbalized understanding.  No further needs identified.  Pt. Fell back asleep immediatly.    Alessandra Bevels Lorraine-COTA/L  10/09/2022, 12:53 PM

## 2022-10-09 NOTE — Plan of Care (Signed)
  Problem: Education: Goal: Understanding of CV disease, CV risk reduction, and recovery process will improve Outcome: Progressing Goal: Individualized Educational Video(s) Outcome: Progressing   Problem: Activity: Goal: Ability to return to baseline activity level will improve Outcome: Progressing   Problem: Cardiovascular: Goal: Ability to achieve and maintain adequate cardiovascular perfusion will improve Outcome: Progressing Goal: Vascular access site(s) Level 0-1 will be maintained Outcome: Progressing   Problem: Health Behavior/Discharge Planning: Goal: Ability to safely manage health-related needs after discharge will improve Outcome: Progressing   Problem: Education: Goal: Knowledge of prescribed regimen will improve Outcome: Progressing   Problem: Activity: Goal: Ability to tolerate increased activity will improve Outcome: Progressing   Problem: Bowel/Gastric: Goal: Gastrointestinal status for postoperative course will improve Outcome: Progressing   Problem: Clinical Measurements: Goal: Postoperative complications will be avoided or minimized Outcome: Progressing Goal: Signs and symptoms of graft occlusion will improve Outcome: Progressing   Problem: Skin Integrity: Goal: Demonstration of wound healing without infection will improve Outcome: Progressing   Problem: Education: Goal: Knowledge of General Education information will improve Description: Including pain rating scale, medication(s)/side effects and non-pharmacologic comfort measures Outcome: Progressing   Problem: Health Behavior/Discharge Planning: Goal: Ability to manage health-related needs will improve Outcome: Progressing   Problem: Clinical Measurements: Goal: Ability to maintain clinical measurements within normal limits will improve Outcome: Progressing Goal: Will remain free from infection Outcome: Progressing Goal: Diagnostic test results will improve Outcome: Progressing Goal:  Respiratory complications will improve Outcome: Progressing Goal: Cardiovascular complication will be avoided Outcome: Progressing   Problem: Activity: Goal: Risk for activity intolerance will decrease Outcome: Progressing   Problem: Nutrition: Goal: Adequate nutrition will be maintained Outcome: Progressing   Problem: Coping: Goal: Level of anxiety will decrease Outcome: Progressing   Problem: Elimination: Goal: Will not experience complications related to bowel motility Outcome: Progressing Goal: Will not experience complications related to urinary retention Outcome: Progressing   Problem: Pain Managment: Goal: General experience of comfort will improve Outcome: Progressing   Problem: Safety: Goal: Ability to remain free from injury will improve Outcome: Progressing   Problem: Skin Integrity: Goal: Risk for impaired skin integrity will decrease Outcome: Progressing   Problem: Education: Goal: Ability to identify signs and symptoms of gastrointestinal bleeding will improve Outcome: Progressing   Problem: Bowel/Gastric: Goal: Will show no signs and symptoms of gastrointestinal bleeding Outcome: Progressing   Problem: Fluid Volume: Goal: Will show no signs and symptoms of excessive bleeding Outcome: Progressing   Problem: Clinical Measurements: Goal: Complications related to the disease process, condition or treatment will be avoided or minimized Outcome: Progressing   Problem: Activity: Goal: Ability to tolerate increased activity will improve Outcome: Progressing   Problem: Respiratory: Goal: Ability to maintain a clear airway and adequate ventilation will improve Outcome: Progressing

## 2022-10-09 NOTE — TOC Progression Note (Signed)
Transition of Care [facility]) - Progression Note    Patient Details  Name: Albert Harris MRN: [id number] Date of Birth: 02-Mar-1956  Transition of Care [facility]) CM/SW Contact  Eduard Roux, [place] Phone Number: 10/09/2022, 1:07 PM  Clinical Narrative:     CM informed CSW- MD advised the patient is stable for discharge. Patient last worked with PT on 04/29 and unable to work with OT today. CSW unable to [employer] auth unless he work w/ PT. Including [facility] does not admit over the weekend.   CSW sent message to PT will need note is patient is able to participate with therapy.  TOC will continue to follow and assist with discharge planning.  Antony Blackbird, MSW, LCSW Clinical Social Worker     Expected Discharge Plan: Skilled Nursing Facility Barriers to Discharge: Continued Medical Work up  Expected Discharge Plan and Services In-house Referral: Clinical Social Work   Post Acute Care Choice: Skilled Nursing Facility Living arrangements for the past 2 months: Single Family Home                                       Social Determinants of Health (SDOH) Interventions SDOH Screenings   Food Insecurity: No Food Insecurity (09/29/2022)  Recent Concern: Food Insecurity - Food Insecurity Present (08/22/2022)  Housing: Low Risk  (09/29/2022)  Transportation Needs: No Transportation Needs (09/29/2022)  Recent Concern: Transportation Needs - Unmet Transportation Needs (08/22/2022)  Utilities: Not At Risk (09/29/2022)  Alcohol Screen: Low Risk  (09/12/2019)  Depression (PHQ2-9): High Risk (05/07/2022)  Financial Resource Strain: Low Risk  (09/12/2019)  Stress: No Stress Concern Present (09/12/2019)  Tobacco Use: Medium Risk (09/29/2022)    Readmission Risk Interventions    08/24/2022   11:27 AM 08/14/2022   10:07 AM 07/24/2022   11:52 AM  Readmission Risk Prevention Plan  Transportation Screening Complete Complete Complete  Medication Review (RN Care Manager)  Referral to Pharmacy Referral to Pharmacy Complete  PCP or Specialist appointment within 3-5 days of discharge  Complete Complete  HRI or Home Care Consult Complete Patient refused Complete  SW Recovery Care/Counseling Consult Complete Complete Patient refused  Palliative Care Screening Not Applicable Not Applicable Not Applicable  Skilled Nursing Facility Not Applicable Not Applicable Not Applicable

## 2022-10-09 NOTE — Progress Notes (Signed)
  Daily Progress Note   Patient Name: Albert Harris       Date: 10/09/2022 DOB: 1956/04/14  Age: 67 y.o. MRN#: [id number] Attending Physician: Jerald Kief, MD Primary Care Physician: [name], NP Admit Date: 08/28/2022 Length of Stay: 42 days  Reason for Consultation/Follow-up: {Reason for Consult:23484}  HPI/Patient Profile:  ***  Subjective:   Subjective: Chart Reviewed. Updates received. Patient Assessed. Created space and opportunity for patient  and family to explore thoughts and feelings regarding current medical situation.  Today's Discussion: ***  Review of Systems  Objective:   Vital Signs:  BP (!) 148/67 (BP Location: Left Arm)   Pulse 85   Temp 98.3 F (36.8 C) (Oral)   Resp 19   Ht 5' 7.99" (1.727 m)   Wt 78 kg   SpO2 100%   BMI 26.15 kg/m   Physical Exam: Physical Exam  Palliative Assessment/Data: ***    Existing Vynca/ACP Documentation: ***  Assessment & Plan:   Impression: Present on Admission:  (Resolved) Lactic acidosis  (Resolved) Metabolic encephalopathy  Lower GI bleed  ***  SUMMARY OF RECOMMENDATIONS   ***  Symptom Management:  ***  Code Status: {Palliative Code status:23503}  Prognosis: {Palliative Care Prognosis:23504}  Discharge Planning: {Palliative dispostion:23505}  Discussed with: ***  Thank you for allowing [place] to participate in the care of Albert Harris PMT will continue to support holistically.  Time Total: ***  Visit consisted of counseling and education dealing with the complex and emotionally intense issues of symptom management and palliative care in the setting of serious and potentially life-threatening illness. Greater than 50%  of this time was spent counseling and coordinating care related to the above assessment and plan.  Wynne Dust, NP Palliative Medicine Team  Team Phone # [phone] (Nights/Weekends)  02/04/2021, 8:17 AM

## 2022-10-09 NOTE — Progress Notes (Signed)
Progress Note   Patient: Albert Harris JYN:[id number] DOB: 1955/08/06 DOA: 08/28/2022     42 DOS: the patient was seen and examined on 10/09/2022   Brief hospital course: 67 year old male with history of ESRD on HD TTS, PVD,COPD, essential hypertension, chronic systolic congestive heart failure with ejection fraction 35 to 40%, presented with abdominal pain nausea and diaphoresis. Patient was also hypotensive with a lactic acidosis and was admitted to ICU,with a pulseless left lower extremity was found to have femoral pseudoaneurysm.Culture came back with MRSA bacteremia-TEE neg for vegetations.  08/29/22: -Admitted to ICU-on vasopressors for septic shock -s/p OR with Dr. Karin Lieu for left femoral exploration with pseudoaneurysm repair,hematoma evacuation>showed dehiscence at proximal aspect of bovine pericardial patch at the suture line.  -Emergent CRRT for severe hyperkalemia 09/08/22 Transferred to [facility] service  09/09/22 CTA for bleeding on left groin: showed Thrombosis of LEFT CFA pseudoaneurysm with new 3.5 cm filling defect, consistent with arterial thrombus, extending from the PSA neck and into the distal CFA. This is at risk for distal embolization and potential acute LEFT lower extremity ischemia.New 8 mm distal LEFT EIA pseudoaneurysm, medial to the stent distal landing zone. Mild interval enlargement of retroperitoneal hematoma, in a craniocaudal dimension measuring 12.2 cm (previously 11.2 cm).No CTA evidence of active extravasation. Patent RIGHT femoral-to-popliteal graft and bilateral iliac stents.Severe burden of splanchnic arterial atherosclerosis, at risk for chronic mesenteric ischemia. 09/09/22: urgent OR:S/P left common iliac to profunda femoris bypass with ipsilateral nonreversed greater saphenous vein in anatomic tunnel, left common iliac artery angioplasty and stenting, left greater saphenous vein harvest, left sartorius muscle of left groin, left lower extremity angiography with  first-order cannulation Transferred to 2H post op 09/12/22 am: 2 BRBPR> s/p Flex sig>showed 5 rectal ulcers, not actively bleeding- stool brown.advised to avoid constipation, added Miralax BID 09/13/22 AM: again 2 large bloody BM ~ 500cc> got 1 unit PRBC  Assessment and Plan: Acute blood loss anemia from bleeding anal/rectal ulcers possibly from his Crohn's disease - Patient underwent flex sigmoidoscopy on 4/6 was found to have 5 nonbleeding ulcers.  He also underwent colonoscopy which showed multiple rectal ulcers with visible bleeding underwent hemostasis. - Patient developed massive rectal bleeding again on 4/18 - Patient is not a candidate for IR intervention due to severe peripheral vascular disease and anatomy - General surgery on board, was taken to the OR  on 4/20 and underwent S/p EUA and over sewing of ulcers and packing anal canal with gelfoam by Dr. Magnus Ivan. - continue budesonide - Per last surgical notes there may be no options for intervention should the patient have any hemodynamically significant bleeding.  See surgical note from Dr. Magnus Ivan 4/21.  Poor prognosis.  Strongly recommend DNR status. -Remains transfusion dependent and medical care now strongly being considered futile especially with even ongoing dialysis as patient now likely to be bedridden and progressive poor quality of life/functional status  -Pt had been resistant to discussing hospice or end of life. Palliative Care consulted and is following   PVD  Left common femoral artery pseudoaneurysm S/p left groin hematoma evacuation, primary repair of the distal external iliac artery. Further complicated on 4/3 by left groin bleeding with new pseudoaneurysm and arterial thrombus. S/p iliofemoral bypass and removal of infected patch with sartorius muscle flap. Further management as per vascular surgery.  - Continue with wound VAC, undergoing hydrotherapy    MRSA bacteremia secondary to postop wound infection with MRSA  and Enterococcus faecium. Infectious disease note from 4/8 recommending daptomycin for  6 weeks followed by indefinite oral suppression of antibiotics. Monitor CPK weekly TEE is negative for vegetations. Patient will need outpatient follow-up with infectious disease to determine oral antibiotics. - continue daptomycin for now; Last dose of IV antibiotics on 10/23/22   ESRD on HD Nephrology on board and dialysis via right IJ tunneled catheter. Further HD sessions as per nephrology -Nephrology following. Continuing to discuss DNR and hospice. Conversations on-going   Chronic systolic heart failure Last echocardiogram from January 2024 showed left ventricular ejection fraction of 35 to 40% Pulmonary hypertension Cannot restart antiplatelet agents at this time Fluid management as per dialysis.   Acute metabolic encephalopathy  - from uremia, acidosis and acute 4 mm right subinsular stroke   Retroperitoneal hemorrhage Appears to be stable    Moderate malnutrition - continue diet   Hypertension:  On metoprolol. Suspect a component of pain.  Continue with hydralazine as needed   COPD:  No wheezing heard.  On RA  Uncontrolled back pain -Continued on multiple narcotics  -recently seen on 5/1 with pt in marked pain. Gave trial of oxycontin 10mg  q12h.  -Per staff, pt was noted to be more lethargic, thus did not receive any more -Today, pt noted to be more encephalopathic. Held further oxycontin. -Pt seems much more comfortable. Advised pt to limit narcotics to only if absolutely needed      Subjective: Less confused today  Physical Exam: Vitals:   10/08/22 2341 10/09/22 0341 10/09/22 0725 10/09/22 1641  BP: 138/73 134/71 (!) 148/67 124/66  Pulse:   85 80  Resp: 20 19 19 17   Temp: 98.5 F (36.9 C) 98.3 F (36.8 C) 98.3 F (36.8 C) 97.9 F (36.6 C)  TempSrc: Oral Oral Oral Oral  SpO2:   100%   Weight:      Height:       General exam: Awake, laying in bed, in  nad Respiratory system: Normal respiratory effort, no wheezing Cardiovascular system: regular rate, s1, s2 Gastrointestinal system: Soft, nondistended, positive BS Central nervous system: CN2-12 grossly intact, strength intact Extremities: Perfused, no clubbing Skin: Normal skin turgor, no notable skin lesions seen Psychiatry: Mood normal // no visual hallucinations   Data Reviewed:  There are no new results to review at this time.  Family Communication: Pt in room, family at bedside  Disposition: Status is: Inpatient Remains inpatient appropriate because: severity of illness  Planned Discharge Destination:  Unclear at this time    Author: Rickey Barbara, MD 10/09/2022 5:37 PM  For on call review [email or web address].

## 2022-10-09 NOTE — Progress Notes (Signed)
Physical Therapy Wound Treatment Patient Details  Name: Albert Harris MRN: [id number] Date of Birth: Oct 21, 1955  Today's Date: 10/09/2022 Time: 1400-1457 Time Calculation (min): 57 min  Subjective  Subjective Assessment Subjective: Pt pleasant and agreeable to wound therapy with premedication for pain Patient and Family Stated Goals: Heal wound Date of Onset:  (unknown) Prior Treatments: Dressing changes  Pain Score:  Premedicated for pain. Pt painful with positioning but tolerated treatment without complaints of pain.   Wound Assessment  Pressure Injury 08/28/22 Sacrum Medial Unstageable - Full thickness tissue loss in which the base of the injury is covered by slough (yellow, tan, gray, green or brown) and/or eschar (tan, brown or black) in the wound bed. Sacral wound with eschar coveri (Active)  Wound Image   10/09/22 1508  Dressing Type Gauze (Comment);Foam - Lift dressing to assess site every shift;Santyl;Barrier Film (skin prep);Moist to moist 10/09/22 1508  Dressing Clean, Dry, Intact;Changed 10/09/22 1508  Dressing Change Frequency PRN 10/09/22 1508  State of Healing Eschar 10/09/22 1508  Site / Wound Assessment Black;Brown;Granulation tissue;Painful;Red;Yellow 10/09/22 1508  % Wound base Red or Granulating 60% 10/09/22 1508  % Wound base Yellow/Fibrinous Exudate 20% 10/09/22 1508  % Wound base Black/Eschar 20% 10/09/22 1508  % Wound base Other/Granulation Tissue (Comment) 0% 10/09/22 1508  Peri-wound Assessment Pink;Purple;Intact 10/09/22 1508  Wound Length (cm) 11.2 cm 10/06/22 1450  Wound Width (cm) 13.3 cm 10/06/22 1450  Wound Depth (cm) 3.4 cm 10/06/22 1450  Wound Surface Area (cm^2) 148.96 cm^2 10/06/22 1450  Wound Volume (cm^3) 506.46 cm^3 10/06/22 1450  Tunneling (cm) 0 09/29/22 1327  Undermining (cm) at 3:00 2.8 cm; at 11:00 2.8 cm (lying on rt side) 10/06/22 1450  Margins Unattached edges (unapproximated) 10/09/22 1508  Drainage Amount Moderate 10/09/22 1508   Drainage Description Purulent;Serosanguineous;Odor - foul 10/09/22 1508  Treatment Debridement (Selective);Irrigation;Packing (Saline gauze) 10/09/22 1508      Selective Debridement (non-excisional) Selective Debridement (non-excisional) - Location: Sacrum Selective Debridement (non-excisional) - Tools Used: Forceps, Scalpel, Scissors Selective Debridement (non-excisional) - Tissue Removed: yellow and black nonviable tissue    Wound Assessment and Plan  Wound Therapy - Assess/Plan/Recommendations Wound Therapy - Clinical Statement: Progressing with debridement. Discussed with pt that wound bed is nearing sacrum and bone is almost visible. Foul odor continues. This patient continues to benefit from wound therapy for selective removal of unviable tissue, to decrease bioburden, and promote wound bed healing. Wound Therapy - Functional Problem List: Decreased tolerance for repositioning and OOB due to wound; global weakness in the setting of extended hospitalization and now ICU stay. Factors Delaying/Impairing Wound Healing: Immobility, Multiple medical problems, Polypharmacy Hydrotherapy Plan: Debridement, Dressing change, Patient/family education Wound Therapy - Frequency: 2X / week Wound Therapy - Follow Up Recommendations: dressing changes by RN  Wound Therapy Goals- Improve the function of patient's integumentary system by progressing the wound(s) through the phases of wound healing (inflammation - proliferation - remodeling) by: Wound Therapy Goals - Improve the function of patient's integumentary system by progressing the wound(s) through the phases of wound healing by: Decrease Necrotic Tissue to: 20% Decrease Necrotic Tissue - Progress: Progressing toward goal Increase Granulation Tissue to: 80% Increase Granulation Tissue - Progress: Progressing toward goal Goals/treatment plan/discharge plan were made with and agreed upon by patient/family: Yes Time For Goal Achievement: 7  days Wound Therapy - Potential for Goals: Fair  Goals will be updated until maximal potential achieved or discharge criteria met.  Discharge criteria: when goals achieved, discharge from hospital, MD decision/surgical  intervention, no progress towards goals, refusal/missing three consecutive treatments without notification or medical reason.  GP     Charges PT Wound Care Charges $Wound Debridement up to 20 cm: < or equal to 20 cm $ Wound Debridement each add'l 20 sqcm: 2 $PT Hydrotherapy Dressing: 1 dressing $PT Hydrotherapy Visit: 1 Visit       [name] 10/09/2022, 3:16 PM  Conni Slipper, PT, DPT Acute Rehabilitation Services Secure Chat Preferred Office: [phone]

## 2022-10-09 NOTE — Progress Notes (Addendum)
Physical Therapy Treatment Patient Details Name: [name] MRN: [id number] DOB: 02/01/1956 Today's Date: 10/09/2022   History of Present Illness 67 year old male  ER with abdominal pain, nausea, diaphoresis and unable to feel his Lt foot. Hypotensive with lactic acidosis in ER.  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 CRRT 3/22-3/26.  3/22 repair of distal Lt external iliac artery. On 3/30, Hgb drop from 9 to 7 with worsening abdominal pain, CT abd 3/30 shows slightly larger anterior pelvic extraperitoneal hematoma. 4/18 -  massive hemoptysis requiring 4 units PRBC, bedside flex sig with bleeding ulcer, hemostatic gel applied, ETT 4/18-19. PMHx: PAD, ESRD HD, recently hospitalized 3/16-18 for acute on chronic left lower extremity ischemia.    PT Comments    Pt received in supine after recently working with PT for hydrotherapy, pt agreeable to working with Albert Harris on EOB mobility and supine/seated LE/UE exercises and bed mobility for strengthening and working on his balance. Pt needing up to +1 maxA for bed mobility and variable min to maxA for seated balance with BUE support. Air bed surface making seated balance slightly more challenging due to posterior pelvic tilt while pt dangling EOB and feet not reaching the floor. Pt with good tolerance for supine/seated BLE exercises, still need AAROM for all LLE movements and unable to perform LAQ on LLE without assist, no AROM when cued for ankle pumps on LLE. Pt continues to benefit from PT services to progress toward functional mobility goals and remains appropriate for short term low intensity post-acute rehab <3 hours/day once medically cleared for DC.    Recommendations for follow up therapy are one component of a multi-disciplinary discharge planning process, led by the attending physician.  Recommendations may be updated based on patient status, additional functional criteria and insurance authorization.  Follow Up  Recommendations  Can patient physically be transported by private vehicle: No    Assistance Recommended at Discharge Frequent or constant Supervision/Assistance  Patient can return home with the following Assist for transportation;Assistance with cooking/housework;Help with stairs or ramp for entrance;A lot of help with walking and/or transfers;A lot of help with bathing/dressing/bathroom   Equipment Recommendations  Other (comment) (TBD; currently would need mechanical lift, hospital bed with air mattress and reclining back wheelchair with roho cushion and elevating leg rests)    Recommendations for Other Services       Precautions / Restrictions Precautions Precautions: Fall Precaution Comments: L groin wound vac; Per verbal order of Dr. Chestine Spore and Dr. Lenell Antu 4/19 - unrestricted L hip ROM, pt appropriate to move OOB and mobilize as able. Restrictions Weight Bearing Restrictions: Yes LLE Weight Bearing: Weight bearing as tolerated     Mobility  Bed Mobility Overal bed mobility: Needs Assistance Bed Mobility: Rolling Rolling: Mod assist Sidelying to sit: Max assist, HOB elevated     Sit to sidelying: Max assist General bed mobility comments: patient performed rolling in bed to his L side, then log roll to sit on L EOB, maxA trunk support and variable min to maxA for static sitting EOB after assist to advance hips to EOB with bed pad and maxA, cues for advancing hips by lateral leans and UE to reduce shearing on his bottom.    Transfers Overall transfer level: Needs assistance                Lateral/Scoot Transfers: Max assist General transfer comment: max to totalA for lateral scooting while pt performing chair push-up; limited due to lack of +2  assist and wanting to avoid shearing on his bottom wound area.       Balance Overall balance assessment: Needs assistance Sitting-balance support: Bilateral upper extremity supported, Single extremity supported, No upper  extremity supported, Feet unsupported Sitting balance-Leahy Scale: Poor Sitting balance - Comments: posterior and L LOB when provided with only Supervision/min guard, pt needing variable min to maxA for trunk support; lip of air bed slightly elevated under his knees while pt sitting causing posterior pelvic tilt which increases his difficulty       Standing balance comment: defer for pt safety, only +1 assist available                            Cognition Arousal/Alertness: Awake/alert Behavior During Therapy: WFL for tasks assessed/performed Overall Cognitive Status: Within Functional Limits for tasks assessed                                 General Comments: Pt agreeable to participate despite recently finishing hydrotherapy. Improved tolerance for activity this date and following simple commands well.        Exercises General Exercises - Lower Extremity Ankle Circles/Pumps: AROM, Right, Sidelying, 10 reps (no AROM on LLE) Quad Sets: AROM, Both, 5 reps, Supine Gluteal Sets:  (vcs for technique) Short Arc Quad: AROM, Supine, AAROM, Both, 10 reps (AA on LLE, AROM on RLE) Long Arc Quad: AROM, AAROM, Both, 10 reps, Seated (AA on LLE) Heel Slides: 5 reps, Supine, Right, AROM Other Exercises Other Exercises: lateral leans with elbow taps x5 reps ea side and side propping on elbow with cues for core activation/anterior lean to engage seated balance muscles. Other Exercises: chair push-ups from EOB for tricep strengthening x3 reps emphasis on deweighting his hips    General Comments General comments (skin integrity, edema, etc.): pt just finished hydrotherapy session and no increased drainage or soiling visible on bed pad while pt mobilizing at EOB; LLE toes still appear dusky, RN/MD aware.      Pertinent Vitals/Pain Pain Assessment Pain Assessment: 0-10 Pain Score: 7  Pain Location: bottom, LLE Pain Descriptors / Indicators: Grimacing, Guarding, Sore,  Constant, Discomfort Pain Intervention(s): Limited activity within patient's tolerance, Monitored during session, Premedicated before session, Repositioned (pain worst with sitting EOB)     PT Goals (current goals can now be found in the care plan section) Acute Rehab PT Goals Patient Stated Goal: Less pain so I can get better and go home. PT Goal Formulation: With patient/family Time For Goal Achievement: 09/30/22 Progress towards PT goals: Progressing toward goals    Frequency    Min 2X/week      PT Plan Current plan remains appropriate       AM-PAC PT "6 Clicks" Mobility   Outcome Measure  Help needed turning from your back to your side while in a flat bed without using bedrails?: A Lot Help needed moving from lying on your back to sitting on the side of a flat bed without using bedrails?: A Lot Help needed moving to and from a bed to a chair (including a wheelchair)?: Total Help needed standing up from a chair using your arms (e.g., wheelchair or bedside chair)?: Total Help needed to walk in hospital room?: Total Help needed climbing 3-5 steps with a railing? : Total 6 Click Score: 8    End of Session Equipment Utilized During Treatment: Other (comment) (bed pads)  Activity Tolerance: Patient limited by pain;Patient tolerated treatment well Patient left: in bed;with call bell/phone within reach;with bed alarm set;Other (comment) (air bed, semi-sidelying toward his L, pillow between his legs and behind R lower back/hips to offload R side and sacrum and under R arm for comfort; reviewed importance increasing of HOB >30* prior to eating/drinking) Nurse Communication: Mobility status;Patient requests pain meds PT Visit Diagnosis: Other abnormalities of gait and mobility (R26.89);Muscle weakness (generalized) (M62.81);Difficulty in walking, not elsewhere classified (R26.2)     Time: 8657-8469 PT Time Calculation (min) (ACUTE ONLY): 20 min  Charges:  $Therapeutic Exercise:  8-22 mins                     Albert Galeno P., Albert Harris Acute Rehabilitation Services Secure Chat Preferred 9a-5:30pm Office: [phone]    Albert Harris [facility] 10/09/2022, 3:55 PM

## 2022-10-10 DIAGNOSIS — A419 Sepsis, unspecified organism: Secondary | ICD-10-CM | POA: Diagnosis not present

## 2022-10-10 DIAGNOSIS — E872 Acidosis, unspecified: Secondary | ICD-10-CM | POA: Diagnosis not present

## 2022-10-10 DIAGNOSIS — R1084 Generalized abdominal pain: Secondary | ICD-10-CM | POA: Diagnosis not present

## 2022-10-10 DIAGNOSIS — R6521 Severe sepsis with septic shock: Secondary | ICD-10-CM | POA: Diagnosis not present

## 2022-10-10 LAB — RENAL FUNCTION PANEL
Albumin: 1.9 g/dL — ABNORMAL LOW (ref 3.5–5.0)
Anion gap: 16 — ABNORMAL HIGH (ref 5–15)
BUN: 64 mg/dL — ABNORMAL HIGH (ref 8–23)
CO2: 26 mmol/L (ref 22–32)
Calcium: 8.8 mg/dL — ABNORMAL LOW (ref 8.9–10.3)
Chloride: 96 mmol/L — ABNORMAL LOW (ref 98–111)
Creatinine, Ser: 4.59 mg/dL — ABNORMAL HIGH (ref 0.61–1.24)
GFR, Estimated: 13 mL/min — ABNORMAL LOW (ref >60)
Glucose, Bld: 63 mg/dL — ABNORMAL LOW (ref 70–99)
Phosphorus: 4.2 mg/dL (ref 2.5–4.6)
Potassium: 4.4 mmol/L (ref 3.5–5.1)
Sodium: 138 mmol/L (ref 135–145)

## 2022-10-10 LAB — HEMOGLOBIN AND HEMATOCRIT, BLOOD
HCT: 24.8 % — ABNORMAL LOW (ref 39.0–52.0)
HCT: 27.4 % — ABNORMAL LOW (ref 39.0–52.0)
Hemoglobin: 8 g/dL — ABNORMAL LOW (ref 13.0–17.0)
Hemoglobin: 8.4 g/dL — ABNORMAL LOW (ref 13.0–17.0)

## 2022-10-10 NOTE — Progress Notes (Signed)
   10/10/22 1120  Vitals  Pulse Rate (!) 102  Resp (!) 25  BP (!) 172/71  SpO2 100 %  O2 Device Room Air  Weight 78 kg  Type of Weight Post-Dialysis  Oxygen Therapy  Patient Activity (if Appropriate) In bed  Pulse Oximetry Type Continuous  Oximetry Probe Site Changed No  Post Treatment  Dialyzer Clearance Lightly streaked  Duration of HD Treatment -hour(s) 3.5 hour(s)  Hemodialysis Intake (mL) 0 mL  Liters Processed 84  Fluid Removed (mL) 500 mL  Tolerated HD Treatment Yes   Received patient in bed to unit.  Alert and oriented.  Informed consent signed and in chart.   TX duration3.5  Patient tolerated well.  Transported back to the room  Alert, without acute distress.  Hand-off given to patient's nurse.   Access used: [facility] Access issues: no complications  Total UF removed: Medication(s) given: oxy 10mg  PO per order   [facility]

## 2022-10-10 NOTE — Progress Notes (Signed)
[facility] Progress Note   Subjective:   seen on HD. No new c/o's.   Objective Vitals:   10/10/22 1120 10/10/22 1208 10/10/22 1211 10/10/22 1638  BP: (!) 172/71 139/81 139/81 (!) 168/66  Pulse: (!) 102 (!) 104 99 80  Resp: (!) 25  20 11   Temp:    98.2 F (36.8 C)  TempSrc:    Oral  SpO2: 100%  100% 99%  Weight: 78 kg     Height:       Physical Exam General: Awake, alert,  On RA, NAD, chronically ill appearing Heart: RRR; no murmur Lungs: CTA anteriorly Abdomen: soft ntnd Back: large, 3/4" deep oval shaped wound over sacrum about 5" x 3.5" Extremities: no LE or UE edema; L foot with ischemic distal 1st -5th toes Dialysis Access: TDC  OP HD: TTS SW  4h   350/800  64.5kg  new TDC (RFA AVG clotted)  Heparin none  - last OP HD 3/14, post wt 65.9kg - venofer 50mg  IV weekly - rocaltrol 0.25 mcg po tiw - phoslo 2 ac tid - no esa, last Hb 11.0  Assessment/Plan: Recurrent lower GIB: S/p colonoscopy 09/15/22, then repeat sigmoidoscopy 4/11. Several bleeding ulcers addressed.  Path came back + Crohn's - started on mesalamine by GI. Brisk re-bleed 4/18, took again for emergent sigmoidoscopy with hemostatic gel applied to bleeding ulcer. Then bled again from his rectum/ anus requiring suturing of bleeding ulcers 4/20 by Dr. Magnus Ivan. Hb stable for last few days now.  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 SP iliofemoral bypass, removal of infected patch with sartorius muscle flap 09/09/22. Ischemic changes to L 3rd-5th toes persist.  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 Neuro signed off.  ESRD: sp short-term CRRT 3/23- 3/26, now back on iHD. HD today.  BP/volume: clear lungs, no edema on exam. Wts are inaccurate. Keeping even today.  Anemia (ESRD + ABLA): Last transfused 1U on 4/25  (total of 25U this admit). Also on Aranesp q Wed (max dose). Hb 7- 8 range.  Secondary HPTH: CorrCa high but now at goal, Phos ok. Continue holding VDRA , continue phoslo for now. Dialysis access: AVG clotted 3/27, went for declot by VVS and then re-clotted. VVS placed R TDC on 4/1.  Hypoalbuminemia - less than 1.5, in hospital > 30 days. Palliative care has revisited, see their notes. Pt has not been responsive to suggestions like DNR, etc. Remains full code.   Vinson Moselle, MD 10/10/2022, 5:14 PM  Recent Labs  Lab 10/08/22 0753 10/08/22 1923 10/09/22 1814 10/10/22 0020 10/10/22 0450  HGB 7.8*   < > 7.9*  --  8.0*  ALBUMIN 1.8*  --   --  1.9*  --   CALCIUM 9.3  --   --  8.8*  --   PHOS 4.7*  --   --  4.2  --   CREATININE 5.99*  --   --  4.59*  --   K 5.6*  --   --  4.4  --    < > = values in this interval not displayed.     Inpatient medications:  (feeding supplement) PROSource Plus  30 mL Oral TID WC   acetaminophen  650 mg Oral Q6H   ascorbic acid  250 mg Oral BID   budesonide  9  mg Oral Daily   calcium acetate  1,334 mg Oral TID WC   Chlorhexidine Gluconate Cloth  6 each Topical Q0600   Chlorhexidine Gluconate Cloth  6 each Topical Q0600   collagenase   Topical Daily   darbepoetin (ARANESP) injection - DIALYSIS  200 mcg Subcutaneous Q Wed-1800   liver oil-zinc oxide   Topical TID   methocarbamol  500 mg Oral TID   metoprolol succinate  25 mg Oral Daily   multivitamin  1 tablet Oral QHS   nutrition supplement (JUVEN)  1 packet Oral BID BM   pantoprazole  40 mg Oral BID   polyethylene glycol  17 g Oral Daily   rosuvastatin  10 mg Oral Daily   sodium chloride flush  10-40 mL Intracatheter Q12H   vitamin A  10,000 Units Oral Daily    sodium chloride 0 mL/hr at 09/22/22 0757   DAPTOmycin (CUBICIN) 600 mg in sodium chloride 0.9 % IVPB 600 mg (10/08/22 2008)   DAPTOmycin (CUBICIN) 750 mg in sodium chloride 0.9 % IVPB Stopped (10/03/22 1935)   sodium chloride      alteplase, dicyclomine, heparin, hydrALAZINE, hydrALAZINE, HYDROmorphone (DILAUDID) injection, labetalol, oxyCODONE, pentafluoroprop-tetrafluoroeth, sodium chloride, traMADol

## 2022-10-10 NOTE — Progress Notes (Signed)
Progress Note   Patient: Albert Harris ZOX:[id number] DOB: 09-07-1955 DOA: 08/28/2022     43 DOS: the patient was seen and examined on 10/10/2022   Brief hospital course: 67 year old male with history of ESRD on HD TTS, PVD,COPD, essential hypertension, chronic systolic congestive heart failure with ejection fraction 35 to 40%, presented with abdominal pain nausea and diaphoresis. Patient was also hypotensive with a lactic acidosis and was admitted to ICU,with a pulseless left lower extremity was found to have femoral pseudoaneurysm.Culture came back with MRSA bacteremia-TEE neg for vegetations.  08/29/22: -Admitted to ICU-on vasopressors for septic shock -s/p OR with Dr. Karin Lieu for left femoral exploration with pseudoaneurysm repair,hematoma evacuation>showed dehiscence at proximal aspect of bovine pericardial patch at the suture line.  -Emergent CRRT for severe hyperkalemia 09/08/22 Transferred to [facility] service  09/09/22 CTA for bleeding on left groin: showed Thrombosis of LEFT CFA pseudoaneurysm with new 3.5 cm filling defect, consistent with arterial thrombus, extending from the PSA neck and into the distal CFA. This is at risk for distal embolization and potential acute LEFT lower extremity ischemia.New 8 mm distal LEFT EIA pseudoaneurysm, medial to the stent distal landing zone. Mild interval enlargement of retroperitoneal hematoma, in a craniocaudal dimension measuring 12.2 cm (previously 11.2 cm).No CTA evidence of active extravasation. Patent RIGHT femoral-to-popliteal graft and bilateral iliac stents.Severe burden of splanchnic arterial atherosclerosis, at risk for chronic mesenteric ischemia. 09/09/22: urgent OR:S/P left common iliac to profunda femoris bypass with ipsilateral nonreversed greater saphenous vein in anatomic tunnel, left common iliac artery angioplasty and stenting, left greater saphenous vein harvest, left sartorius muscle of left groin, left lower extremity angiography with  first-order cannulation Transferred to 2H post op 09/12/22 am: 2 BRBPR> s/p Flex sig>showed 5 rectal ulcers, not actively bleeding- stool brown.advised to avoid constipation, added Miralax BID 09/13/22 AM: again 2 large bloody BM ~ 500cc> got 1 unit PRBC  Assessment and Plan: Acute blood loss anemia from bleeding anal/rectal ulcers possibly from his Crohn's disease - Patient underwent flex sigmoidoscopy on 4/6 was found to have 5 nonbleeding ulcers.  He also underwent colonoscopy which showed multiple rectal ulcers with visible bleeding underwent hemostasis. - Patient developed massive rectal bleeding again on 4/18 - Patient is not a candidate for IR intervention due to severe peripheral vascular disease and anatomy - General surgery on board, was taken to the OR  on 4/20 and underwent S/p EUA and over sewing of ulcers and packing anal canal with gelfoam by Dr. Magnus Ivan. - continue budesonide - Per last surgical notes there may be no options for intervention should the patient have any hemodynamically significant bleeding.  See surgical note from Dr. Magnus Ivan 4/21.  Poor prognosis.  Strongly recommend DNR status. -Remains transfusion dependent and medical care now strongly being considered futile especially with even ongoing dialysis as patient now likely to be bedridden and progressive poor quality of life/functional status  -Pt had been resistant to discussing hospice or end of life. Palliative Care consulted continues to follow   PVD  Left common femoral artery pseudoaneurysm S/p left groin hematoma evacuation, primary repair of the distal external iliac artery. Further complicated on 4/3 by left groin bleeding with new pseudoaneurysm and arterial thrombus. S/p iliofemoral bypass and removal of infected patch with sartorius muscle flap. Further management as per vascular surgery.  - Continue with wound VAC, undergoing hydrotherapy    MRSA bacteremia secondary to postop wound infection with  MRSA and Enterococcus faecium. Infectious disease note from 4/8 recommending daptomycin for  6 weeks followed by indefinite oral suppression of antibiotics. Monitor CPK weekly TEE is negative for vegetations. Patient will need outpatient follow-up with infectious disease to determine oral antibiotics. - continue daptomycin for now; Last dose of IV antibiotics on 10/23/22   ESRD on HD Nephrology on board and dialysis via right IJ tunneled catheter. Further HD sessions as per nephrology -Nephrology following. Continuing to discuss DNR and hospice. Conversations on-going   Chronic systolic heart failure Last echocardiogram from January 2024 showed left ventricular ejection fraction of 35 to 40% Pulmonary hypertension Cannot restart antiplatelet agents at this time Continue fluid management as per dialysis.   Acute metabolic encephalopathy  - from uremia, acidosis and acute 4 mm right subinsular stroke   Retroperitoneal hemorrhage Appears to be stable    Moderate malnutrition - continue diet   Hypertension:  On metoprolol. Suspect a component of pain.  Continue with hydralazine as needed   COPD:  No wheezing heard.  On RA  Uncontrolled back pain -Continued on multiple narcotics  -recently seen on 5/1 with pt in marked pain. Gave trial of oxycontin 10mg  q12h.  -Per staff, pt was noted to be more lethargic, thus did not receive any more -Today, pt noted to be more encephalopathic. Held further oxycontin. -Pt seems much more comfortable. Advised pt to limit narcotics to only if absolutely needed      Subjective: Seen on HD. Without complaints this AM  Physical Exam: Vitals:   10/10/22 1119 10/10/22 1120 10/10/22 1208 10/10/22 1211  BP: 115/78 (!) 172/71 139/81 139/81  Pulse: 96 (!) 102 (!) 104 99  Resp: (!) 21 (!) 25  20  Temp:      TempSrc:      SpO2: 100% 100%  100%  Weight:  78 kg    Height:       General exam: Conversant, in no acute distress Respiratory system:  normal chest rise, clear, no audible wheezing Cardiovascular system: regular rhythm, s1-s2 Gastrointestinal system: Nondistended, nontender, pos BS Central nervous system: No seizures, no tremors Extremities: No cyanosis, no joint deformities Skin: No rashes, no pallor Psychiatry: Affect normal // no auditory hallucinations   Data Reviewed:  Labs reviewed: Na 138, K 4.4, Cr 4.59, Hgb 8.0  Family Communication: Pt in room, family not at bedside  Disposition: Status is: Inpatient Remains inpatient appropriate because: severity of illness  Planned Discharge Destination:  Unclear at this time    Author: Rickey Barbara, MD 10/10/2022 4:19 PM  For on call review [email or web address].

## 2022-10-11 DIAGNOSIS — R1084 Generalized abdominal pain: Secondary | ICD-10-CM | POA: Diagnosis not present

## 2022-10-11 DIAGNOSIS — A419 Sepsis, unspecified organism: Secondary | ICD-10-CM | POA: Diagnosis not present

## 2022-10-11 DIAGNOSIS — M79605 Pain in left leg: Secondary | ICD-10-CM | POA: Diagnosis not present

## 2022-10-11 DIAGNOSIS — R6521 Severe sepsis with septic shock: Secondary | ICD-10-CM | POA: Diagnosis not present

## 2022-10-11 LAB — HEMOGLOBIN AND HEMATOCRIT, BLOOD
HCT: 25.1 % — ABNORMAL LOW (ref 39.0–52.0)
Hemoglobin: 7.7 g/dL — ABNORMAL LOW (ref 13.0–17.0)

## 2022-10-11 LAB — RENAL FUNCTION PANEL
Albumin: 1.8 g/dL — ABNORMAL LOW (ref 3.5–5.0)
Anion gap: 15 (ref 5–15)
BUN: 57 mg/dL — ABNORMAL HIGH (ref 8–23)
CO2: 26 mmol/L (ref 22–32)
Calcium: 8.5 mg/dL — ABNORMAL LOW (ref 8.9–10.3)
Chloride: 94 mmol/L — ABNORMAL LOW (ref 98–111)
Creatinine, Ser: 4.39 mg/dL — ABNORMAL HIGH (ref 0.61–1.24)
GFR, Estimated: 14 mL/min — ABNORMAL LOW (ref >60)
Glucose, Bld: 89 mg/dL (ref 70–99)
Phosphorus: 4.2 mg/dL (ref 2.5–4.6)
Potassium: 4.3 mmol/L (ref 3.5–5.1)
Sodium: 135 mmol/L (ref 135–145)

## 2022-10-11 NOTE — Progress Notes (Signed)
Dc'ed TLIJ per MD order.  Dressed with vaseline gauze and tegaderm, instructed to lie flat.  No bleeding, tolerated procedure well.

## 2022-10-11 NOTE — Progress Notes (Signed)
[facility] Progress Note   Subjective:   seen on HD. No new c/o's.   Objective Vitals:   10/11/22 0323 10/11/22 0806 10/11/22 1226 10/11/22 1737  BP: (!) 145/71 (!) 170/96 (!) 146/79 (!) 160/69  Pulse: 83 91 88 95  Resp: 16 20 18 15   Temp: 98 F (36.7 C) 98 F (36.7 C) 98.1 F (36.7 C) 98.1 F (36.7 C)  TempSrc: Oral Oral Oral Oral  SpO2: 98% 96% 95% 99%  Weight:      Height:       Physical Exam General: Awake, alert,  On RA, NAD, chronically ill appearing Heart: RRR; no murmur Lungs: CTA anteriorly Abdomen: soft ntnd Back: large, 3/4" deep oval shaped wound over sacrum about 5" x 3.5" Extremities: no LE or UE edema; L foot with ischemic distal 1st -5th toes Dialysis Access: TDC  OP HD: TTS SW  4h   350/800  64.5kg  new TDC (RFA AVG clotted)  Heparin none  - last OP HD 3/14, post wt 65.9kg - venofer 50mg  IV weekly - rocaltrol 0.25 mcg po tiw - phoslo 2 ac tid - no esa, last Hb 11.0  Assessment/Plan: Recurrent lower GIB: S/p colonoscopy 09/15/22, then repeat sigmoidoscopy 4/11. Several bleeding ulcers addressed.  Path came back + Crohn's - started on mesalamine by GI. Brisk re-bleed 4/18, took again for emergent sigmoidoscopy with hemostatic gel applied to bleeding ulcer. Then bled again from his rectum/ anus requiring suturing of bleeding ulcers 4/20 by Dr. Magnus Ivan. Hb stable for last few days now.  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 SP iliofemoral bypass, removal of infected patch with sartorius muscle flap 09/09/22. Ischemic changes to L 3rd-5th toes persist.  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 Neuro signed off.  ESRD: sp short-term CRRT 3/23- 3/26, now back on iHD. Next HD 5/07.  BP/volume: clear lungs, no edema on exam. Wts are inaccurate. Keeping even last  HD. Anemia (ESRD + ABLA): Last transfused 1U on 4/25 (total of 25U this admit). Also on Aranesp q Wed (max dose). Hb 7- 8 range.  Secondary HPTH: CorrCa high but now at goal, Phos ok. Continue holding VDRA , continue phoslo for now. Dialysis access: AVG clotted 3/27, went for declot by VVS and then re-clotted. VVS placed R TDC on 4/1.  Hypoalbuminemia - less than 1.5, in hospital > 30 days. Palliative care has revisited, see their notes. Pt has not been responsive to suggestions like DNR, etc. Remains full code.   Vinson Moselle, MD 10/11/2022, 6:00 PM  Recent Labs  Lab 10/10/22 0020 10/10/22 0450 10/10/22 1741 10/11/22 0621  HGB  --    < > 8.4* 7.7*  ALBUMIN 1.9*  --   --  1.8*  CALCIUM 8.8*  --   --  8.5*  PHOS 4.2  --   --  4.2  CREATININE 4.59*  --   --  4.39*  K 4.4  --   --  4.3   < > = values in this interval not displayed.     Inpatient medications:  (feeding supplement) PROSource Plus  30 mL Oral TID WC   acetaminophen  650 mg Oral Q6H   ascorbic acid  250 mg Oral BID   budesonide  9 mg Oral Daily   calcium acetate  1,334 mg  Oral TID WC   Chlorhexidine Gluconate Cloth  6 each Topical Q0600   Chlorhexidine Gluconate Cloth  6 each Topical Q0600   collagenase   Topical Daily   darbepoetin (ARANESP) injection - DIALYSIS  200 mcg Subcutaneous Q Wed-1800   liver oil-zinc oxide   Topical TID   methocarbamol  500 mg Oral TID   metoprolol succinate  25 mg Oral Daily   multivitamin  1 tablet Oral QHS   nutrition supplement (JUVEN)  1 packet Oral BID BM   pantoprazole  40 mg Oral BID   polyethylene glycol  17 g Oral Daily   rosuvastatin  10 mg Oral Daily   sodium chloride flush  10-40 mL Intracatheter Q12H   vitamin A  10,000 Units Oral Daily    sodium chloride 0 mL/hr at 09/22/22 0757   DAPTOmycin (CUBICIN) 600 mg in sodium chloride 0.9 % IVPB 600 mg (10/08/22 2008)   DAPTOmycin (CUBICIN) 750 mg in sodium chloride 0.9 % IVPB 750 mg (10/10/22 1808)   sodium chloride      alteplase, dicyclomine, heparin, hydrALAZINE, hydrALAZINE, HYDROmorphone (DILAUDID) injection, labetalol, oxyCODONE, pentafluoroprop-tetrafluoroeth, sodium chloride, traMADol

## 2022-10-11 NOTE — Progress Notes (Addendum)
Progress Note   Patient: Albert Harris DOB: 19-Jun-1955 DOA: 08/28/2022     44 DOS: the patient was seen and examined on 10/11/2022   Brief hospital course: 67 year old male with history of ESRD on HD TTS, PVD,COPD, essential hypertension, chronic systolic congestive heart failure with ejection fraction 35 to 40%, presented with abdominal pain nausea and diaphoresis. Patient was also hypotensive with a lactic acidosis and was admitted to ICU,with a pulseless left lower extremity was found to have femoral pseudoaneurysm.Culture came back with MRSA bacteremia-TEE neg for vegetations.  08/29/22: -Admitted to ICU-on vasopressors for septic shock -s/p OR with Dr. Karin Lieu for left femoral exploration with pseudoaneurysm repair,hematoma evacuation>showed dehiscence at proximal aspect of bovine pericardial patch at the suture line.  -Emergent CRRT for severe hyperkalemia 09/08/22 Transferred to [facility] service  09/09/22 CTA for bleeding on left groin: showed Thrombosis of LEFT CFA pseudoaneurysm with new 3.5 cm filling defect, consistent with arterial thrombus, extending from the PSA neck and into the distal CFA. This is at risk for distal embolization and potential acute LEFT lower extremity ischemia.New 8 mm distal LEFT EIA pseudoaneurysm, medial to the stent distal landing zone. Mild interval enlargement of retroperitoneal hematoma, in a craniocaudal dimension measuring 12.2 cm (previously 11.2 cm).No CTA evidence of active extravasation. Patent RIGHT femoral-to-popliteal graft and bilateral iliac stents.Severe burden of splanchnic arterial atherosclerosis, at risk for chronic mesenteric ischemia. 09/09/22: urgent OR:S/P left common iliac to profunda femoris bypass with ipsilateral nonreversed greater saphenous vein in anatomic tunnel, left common iliac artery angioplasty and stenting, left greater saphenous vein harvest, left sartorius muscle of left groin, left lower extremity angiography with  first-order cannulation Transferred to 2H post op 09/12/22 am: 2 BRBPR> s/p Flex sig>showed 5 rectal ulcers, not actively bleeding- stool brown.advised to avoid constipation, added Miralax BID 09/13/22 AM: again 2 large bloody BM ~ 500cc> got 1 unit PRBC  Assessment and Plan: Acute blood loss anemia from bleeding anal/rectal ulcers possibly from his Crohn's disease - Patient underwent flex sigmoidoscopy on 4/6 was found to have 5 nonbleeding ulcers.  He also underwent colonoscopy which showed multiple rectal ulcers with visible bleeding underwent hemostasis. - Patient developed massive rectal bleeding again on 4/18 - Patient is not a candidate for IR intervention due to severe peripheral vascular disease and anatomy - General surgery on board, was taken to the OR  on 4/20 and underwent S/p EUA and over sewing of ulcers and packing anal canal with gelfoam by Dr. Magnus Ivan. - continue budesonide - Per last surgical notes there may be no options for intervention should the patient have any hemodynamically significant bleeding.  See surgical note from Dr. Magnus Ivan 4/21.  Poor prognosis.  Strongly recommend DNR status. -Remains transfusion dependent and medical care now strongly being considered futile especially with even ongoing dialysis as patient now likely to be bedridden and progressive poor quality of life/functional status  -Pt had been resistant to discussing hospice or end of life. Palliative Care continues to follow   PVD  Left common femoral artery pseudoaneurysm S/p left groin hematoma evacuation, primary repair of the distal external iliac artery. Further complicated on 4/3 by left groin bleeding with new pseudoaneurysm and arterial thrombus. S/p iliofemoral bypass and removal of infected patch with sartorius muscle flap. Further management as per vascular surgery.  - Continue with wound VAC, had been undergoing hydrotherapy hydrotherapy    MRSA bacteremia secondary to postop wound  infection with MRSA and Enterococcus faecium. Infectious disease note from 4/8 recommending  daptomycin for 6 weeks followed by indefinite oral suppression of antibiotics. Monitor CPK weekly TEE is negative for vegetations. Patient will need outpatient follow-up with infectious disease to determine oral antibiotics. - continue daptomycin for now; Last dose of IV antibiotics on 10/23/22   ESRD on HD Nephrology on board and dialysis via right IJ tunneled catheter. Further HD sessions as per nephrology -Nephrology following. Continuing to discuss DNR and hospice. Conversations on-going   Chronic systolic heart failure Last echocardiogram from January 2024 showed left ventricular ejection fraction of 35 to 40% Pulmonary hypertension Cannot restart antiplatelet agents at this time Continue fluid management as per dialysis.   Acute metabolic encephalopathy  - from uremia, acidosis and acute 4 mm right subinsular stroke   Retroperitoneal hemorrhage Appears to be stable    Moderate malnutrition - continue diet   Hypertension:  On metoprolol. Suspect a component of pain.  Continue with hydralazine as needed Cont analgesia as needed   COPD:  No wheezing heard.  On RA  Uncontrolled back pain -Continued on multiple narcotics  -Recently given trial of oxycontin, however pt later noted to be more drowsy and encephalopathic. -Pt seems much more comfortable. Advised pt to limit narcotics to only if absolutely needed     Subjective: Complains of discomfort at IJ site  Physical Exam: Vitals:   10/11/22 0323 10/11/22 0806 10/11/22 1226 10/11/22 1737  BP: (!) 145/71 (!) 170/96 (!) 146/79 (!) 160/69  Pulse: 83 91 88 95  Resp: 16 20 18 15   Temp: 98 F (36.7 C) 98 F (36.7 C) 98.1 F (36.7 C) 98.1 F (36.7 C)  TempSrc: Oral Oral Oral Oral  SpO2: 98% 96% 95% 99%  Weight:      Height:       General exam: Awake, laying in bed, in nad Respiratory system: Normal respiratory effort, no  wheezing Cardiovascular system: regular rate, s1, s2 Gastrointestinal system: Soft, nondistended, positive BS Central nervous system: CN2-12 grossly intact, strength intact Extremities: Perfused, no clubbing Skin: Normal skin turgor, no notable skin lesions seen Psychiatry: Mood normal // no visual hallucinations   Data Reviewed:  Labs reviewed: Na 135, K 4.3, Cr 4.39, Hgb 7.7  Family Communication: Pt in room, family not at bedside  Disposition: Status is: Inpatient Remains inpatient appropriate because: severity of illness  Planned Discharge Destination:  Unclear at this time    Author: Rickey Barbara, MD 10/11/2022 5:42 PM  For on call review [email or web address].

## 2022-10-12 DIAGNOSIS — A419 Sepsis, unspecified organism: Secondary | ICD-10-CM | POA: Diagnosis not present

## 2022-10-12 DIAGNOSIS — E872 Acidosis, unspecified: Secondary | ICD-10-CM | POA: Diagnosis not present

## 2022-10-12 DIAGNOSIS — R1084 Generalized abdominal pain: Secondary | ICD-10-CM | POA: Diagnosis not present

## 2022-10-12 DIAGNOSIS — R6521 Severe sepsis with septic shock: Secondary | ICD-10-CM | POA: Diagnosis not present

## 2022-10-12 LAB — RENAL FUNCTION PANEL
Albumin: 1.8 g/dL — ABNORMAL LOW (ref 3.5–5.0)
Anion gap: 17 — ABNORMAL HIGH (ref 5–15)
BUN: 95 mg/dL — ABNORMAL HIGH (ref 8–23)
CO2: 25 mmol/L (ref 22–32)
Calcium: 9.3 mg/dL (ref 8.9–10.3)
Chloride: 95 mmol/L — ABNORMAL LOW (ref 98–111)
Creatinine, Ser: 5.72 mg/dL — ABNORMAL HIGH (ref 0.61–1.24)
GFR, Estimated: 10 mL/min — ABNORMAL LOW (ref >60)
Glucose, Bld: 94 mg/dL (ref 70–99)
Phosphorus: 4.3 mg/dL (ref 2.5–4.6)
Potassium: 4.8 mmol/L (ref 3.5–5.1)
Sodium: 137 mmol/L (ref 135–145)

## 2022-10-12 LAB — HEMOGLOBIN AND HEMATOCRIT, BLOOD
HCT: 23.7 % — ABNORMAL LOW (ref 39.0–52.0)
Hemoglobin: 7.6 g/dL — ABNORMAL LOW (ref 13.0–17.0)

## 2022-10-12 LAB — CK: Total CK: 20 U/L — ABNORMAL LOW (ref 49–397)

## 2022-10-12 MED ORDER — CHLORHEXIDINE GLUCONATE CLOTH 2 % EX PADS
6.0000 | MEDICATED_PAD | Freq: Every day | CUTANEOUS | Status: DC
  Administered 2022-10-12 – 2022-10-22 (×8): 6 via TOPICAL

## 2022-10-12 NOTE — Progress Notes (Signed)
  Progress Note    10/12/2022 6:21 PM 16 Days Post-Op  Subjective:  no major complaints this morning. Bottom sore   Vitals:   10/12/22 1219 10/12/22 1733  BP: (!) 152/76 (!) 157/80  Pulse: 84 93  Resp: 16 20  Temp: 98 F (36.7 C) 98.4 F (36.9 C)  SpO2: 94% 97%   Physical Exam: Cardiac:  regular Lungs:  non labored Incisions:  left groin with VAC to suction, good seal Extremities:  Ischemic chance to all left toes. Dry gangrene Abdomen:  soft, non distended Neurologic: alert and oriented  CBC    Component Value Date/Time   WBC 13.4 (H) 10/06/2022 0914   RBC 2.36 (L) 10/06/2022 0914   HGB 7.6 (L) 10/12/2022 0605   HGB 10.4 (L) 06/18/2021 1431   HCT 23.7 (L) 10/12/2022 0605   HCT 31.6 (L) 06/18/2021 1431   PLT 226 10/06/2022 0914   PLT 142 (L) 06/18/2021 1431   MCV 98.7 10/06/2022 0914   MCV 96 06/18/2021 1431   MCH 30.9 10/06/2022 0914   MCHC 31.3 10/06/2022 0914   RDW 19.1 (H) 10/06/2022 0914   RDW 14.3 06/18/2021 1431   LYMPHSABS 1.3 09/30/2022 0803   LYMPHSABS 1.5 06/18/2021 1431   MONOABS 1.1 (H) 09/30/2022 0803   EOSABS 0.4 09/30/2022 0803   EOSABS 1.0 (H) 06/18/2021 1431   BASOSABS 0.1 09/30/2022 0803   BASOSABS 0.1 06/18/2021 1431    BMET    Component Value Date/Time   NA 137 10/12/2022 0605   NA 141 06/18/2021 1431   K 4.8 10/12/2022 0605   CL 95 (L) 10/12/2022 0605   CO2 25 10/12/2022 0605   GLUCOSE 94 10/12/2022 0605   BUN 95 (H) 10/12/2022 0605   BUN 20 06/18/2021 1431   CREATININE 5.72 (H) 10/12/2022 0605   CREATININE 1.15 02/19/2017 1049   CALCIUM 9.3 10/12/2022 0605   CALCIUM 8.5 (L) 06/10/2020 0900   GFRNONAA 10 (L) 10/12/2022 0605   GFRAA 6 (L) 03/04/2020 1045    INR    Component Value Date/Time   INR 1.2 08/12/2022 1501    No intake or output data in the 24 hours ending 10/12/22 1821    Assessment/Plan:  67 y.o. male is s/p removal of infected left femoral patch, iliofemoral bypass, with sartorius muscle flap 09/09/22    progression of ischemic changes to the toes- no plan for intervention at this time. Pt needs to improve clinically in other aspects prior to elective AKA Appreciate [facility] nursing and their care Continue Therapies as able The sacral wound remains a major concern due to poor perfusion for wound healing.      Victorino Sparrow MD [facility] [phone] 10/12/2022 6:21 PM

## 2022-10-12 NOTE — Progress Notes (Signed)
Pharmacy Antibiotic Note  Albert Harris is a 67 y.o. 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 Now with MRSA bacteremia. Noted patient is ESRD HD TTS PTA, with a right arm AV graft and a catheter in his right IJ. Pharmacy consulted to dose daptomycin due to enterococcus faecium and staph aureus growing from tissue cultures from 4/3.  -HD on TTS schedule  -CK today= 20  Plan: Continue Daptomycin 600mg  (8 mg/kg) IV post HD on Tues/Thurs and 750 mg (10 m/gk) IV post HD on Saturdays Monitor weekly CK, follow HD schedule Discharge orders pended   Height: 5' 7.99" (172.7 cm) Weight: 78 kg (171 lb 15.3 oz) IBW/kg (Calculated) : 68.38  Temp (24hrs), Avg:98.1 F (36.7 C), Min:98 F (36.7 C), Max:98.3 F (36.8 C)  Recent Labs  Lab 10/06/22 0914 10/07/22 0500 10/08/22 0753 10/10/22 0020 10/11/22 0621 10/12/22 0605  WBC 13.4*  --   --   --   --   --   CREATININE  --  4.42* 5.99* 4.59* 4.39* 5.72*     Estimated Creatinine Clearance: 12 mL/min (A) (by C-G formula based on SCr of 5.72 mg/dL (H)).    Allergies  Allergen Reactions   Benadryl [Diphenhydramine] Rash   Lidocaine-Prilocaine Rash    Caused red marks up and down arm   Antimicrobials this admission:  Cefazolin 3/23 x 1 Vancomycin 3/24 >> 4/5 Zosyn 4/3 >>4/3 Daptomycin 4/5>>5/17  CK 4/8 57 4/16 25 4/22 18 4/29 CK 45  Microbiology results:  4/3 L groin wound: MSRA + VRE 4/3 MRSA PCR: neg 3/27 Bcx: neg 3/25 Bcx: MRSA 3/23 Bcx: MRSA   Thank you for involving pharmacy in this patient's care.  Harland German, PharmD Clinical Pharmacist **Pharmacist phone directory can now be found on [email or web address] ([email or web address]).  Listed under [facility].

## 2022-10-12 NOTE — Progress Notes (Signed)
Progress Note   Patient: Albert Harris [id number]:[id number] DOB: [birth year] DOA: 08/28/2022     45 DOS: the patient was seen and examined on 10/12/2022   Brief hospital course: 67 year old male with history of ESRD on HD TTS, PVD,COPD, essential hypertension, chronic systolic congestive heart failure with ejection fraction 35 to 40%, presented with abdominal pain nausea and diaphoresis. Patient was also hypotensive with a lactic acidosis and was admitted to ICU,with a pulseless left lower extremity was found to have femoral pseudoaneurysm.Culture came back with MRSA bacteremia-TEE neg for vegetations.  08/29/22: -Admitted to ICU-on vasopressors for septic shock -s/p OR with Dr. Karin Lieu for left femoral exploration with pseudoaneurysm repair,hematoma evacuation>showed dehiscence at proximal aspect of bovine pericardial patch at the suture line.  -Emergent CRRT for severe hyperkalemia 09/08/22 Transferred to [facility] service  09/09/22 CTA for bleeding on left groin: showed Thrombosis of LEFT CFA pseudoaneurysm with new 3.5 cm filling defect, consistent with arterial thrombus, extending from the PSA neck and into the distal CFA. This is at risk for distal embolization and potential acute LEFT lower extremity ischemia.New 8 mm distal LEFT EIA pseudoaneurysm, medial to the stent distal landing zone. Mild interval enlargement of retroperitoneal hematoma, in a craniocaudal dimension measuring 12.2 cm (previously 11.2 cm).No CTA evidence of active extravasation. Patent RIGHT femoral-to-popliteal graft and bilateral iliac stents.Severe burden of splanchnic arterial atherosclerosis, at risk for chronic mesenteric ischemia. 09/09/22: urgent OR:S/P left common iliac to profunda femoris bypass with ipsilateral nonreversed greater saphenous vein in anatomic tunnel, left common iliac artery angioplasty and stenting, left greater saphenous vein harvest, left sartorius muscle of left groin, left lower extremity angiography with  first-order cannulation Transferred to 2H post op 09/12/22 am: 2 BRBPR> s/p Flex sig>showed 5 rectal ulcers, not actively bleeding- stool brown.advised to avoid constipation, added Miralax BID 09/13/22 AM: again 2 large bloody BM ~ 500cc> got 1 unit PRBC  Assessment and Plan: Acute blood loss anemia from bleeding anal/rectal ulcers possibly from his Crohn's disease - Patient underwent flex sigmoidoscopy on 4/6 was found to have 5 nonbleeding ulcers.  He also underwent colonoscopy which showed multiple rectal ulcers with visible bleeding underwent hemostasis. - Patient developed massive rectal bleeding again on 4/18 - Patient is not a candidate for IR intervention due to severe peripheral vascular disease and anatomy - General surgery on board, was taken to the OR  on 4/20 and underwent S/p EUA and over sewing of ulcers and packing anal canal with gelfoam by Dr. Magnus Ivan. - continue budesonide - Per last surgical notes there may be no options for intervention should the patient have any hemodynamically significant bleeding.  See surgical note from Dr. Magnus Ivan 4/21.  Poor prognosis.  Strongly recommend DNR status. -Remains transfusion dependent and medical care now strongly being considered futile especially with even ongoing dialysis as patient now likely to be bedridden and progressive poor quality of life/functional status  -Pt had been resistant to discussing hospice or end of life. Palliative Care is following as of 5/4, plan is to back off from Palliative care and focus on d/c to SNF/rehab. TOC following   PVD  Left common femoral artery pseudoaneurysm S/p left groin hematoma evacuation, primary repair of the distal external iliac artery. Further complicated on 4/3 by left groin bleeding with new pseudoaneurysm and arterial thrombus. S/p iliofemoral bypass and removal of infected patch with sartorius muscle flap. Further management as per vascular surgery.  - Continue with wound VAC, had been  undergoing hydrotherapy hydrotherapy  MRSA bacteremia secondary to postop wound infection with MRSA and Enterococcus faecium. Infectious disease note from 4/8 recommending daptomycin for 6 weeks followed by indefinite oral suppression of antibiotics. Monitor CPK weekly TEE is negative for vegetations. Patient will need outpatient follow-up with infectious disease to determine oral antibiotics. - continue daptomycin for now; Last dose of IV antibiotics on 10/23/22   ESRD on HD Nephrology on board and dialysis via right IJ tunneled catheter. Further HD sessions as per nephrology -Nephrology following. Continuing to discuss DNR and hospice. Conversations on-going   Chronic systolic heart failure Last echocardiogram from January 2024 showed left ventricular ejection fraction of 35 to 40% Pulmonary hypertension Cannot restart antiplatelet agents at this time Continue fluid management as per dialysis.   Acute metabolic encephalopathy  - from uremia, acidosis and acute 4 mm right subinsular stroke   Retroperitoneal hemorrhage Appears to be stable    Moderate malnutrition - continue diet   Hypertension:  On metoprolol. Suspect a component of pain.  Continue with hydralazine as needed Cont analgesia as needed   COPD:  No wheezing heard.  On RA  Uncontrolled back pain -Continued on multiple narcotics  -Recently given trial of oxycontin, however pt later noted to be more drowsy and encephalopathic. -Pt seems much more comfortable. Advised pt to limit narcotics to only if absolutely needed -Appears comfortable today     Subjective: Without complaints today  Physical Exam: Vitals:   10/11/22 1938 10/12/22 0531 10/12/22 0818 10/12/22 1219  BP: (!) 165/83 (!) 169/93 (!) 146/54 (!) 152/76  Pulse: 81 90 95 84  Resp: 20 20 20 16   Temp: 98 F (36.7 C) 98 F (36.7 C) 98.3 F (36.8 C) 98 F (36.7 C)  TempSrc: Oral Oral Oral Oral  SpO2: 98%  96% 94%  Weight:      Height:        General exam: Conversant, in no acute distress Respiratory system: normal chest rise, clear, no audible wheezing Cardiovascular system: regular rhythm, s1-s2 Gastrointestinal system: Nondistended, nontender, pos BS Central nervous system: No seizures, no tremors Extremities: No cyanosis, no joint deformities Skin: No rashes, no pallor Psychiatry: Affect normal // no auditory hallucinations   Data Reviewed:  Labs reviewed: Na 137, K 4.8, Cr 5.72, Hgb 7.6  Family Communication: Pt in room, family not at bedside  Disposition: Status is: Inpatient Remains inpatient appropriate because: severity of illness  Planned Discharge Destination: Skilled nursing facility    Author: Rickey Barbara, MD 10/12/2022 4:58 PM  For on call review [email or web address].

## 2022-10-12 NOTE — TOC Progression Note (Signed)
Transition of Care [facility]) - Progression Note    Patient Details  Name: Albert Harris MRN: [id number] Date of Birth: 03-04-1956  Transition of Care [facility]) CM/SW Contact  Eduard Roux, [place] Phone Number: 10/12/2022, 5:56 PM  Clinical Narrative:     Sent message to [facility] to confirm bed offer and to confirm if they can transport to his HD clinic. CSW awaiting on returned call.     Expected Discharge Plan: Skilled Nursing Facility Barriers to Discharge: Continued Medical Work up  Expected Discharge Plan and Services In-house Referral: Clinical Social Work   Post Acute Care Choice: Skilled Nursing Facility Living arrangements for the past 2 months: Single Family Home                                       Social Determinants of Health (SDOH) Interventions SDOH Screenings   Food Insecurity: No Food Insecurity (09/29/2022)  Recent Concern: Food Insecurity - Food Insecurity Present (08/22/2022)  Housing: Low Risk  (09/29/2022)  Transportation Needs: No Transportation Needs (09/29/2022)  Recent Concern: Transportation Needs - Unmet Transportation Needs (08/22/2022)  Utilities: Not At Risk (09/29/2022)  Alcohol Screen: Low Risk  (09/12/2019)  Depression (PHQ2-9): High Risk (05/07/2022)  Financial Resource Strain: Low Risk  (09/12/2019)  Stress: No Stress Concern Present (09/12/2019)  Tobacco Use: Medium Risk (09/29/2022)    Readmission Risk Interventions    08/24/2022   11:27 AM 08/14/2022   10:07 AM 07/24/2022   11:52 AM  Readmission Risk Prevention Plan  Transportation Screening Complete Complete Complete  Medication Review (RN Care Manager) Referral to Pharmacy Referral to Pharmacy Complete  PCP or Specialist appointment within 3-5 days of discharge  Complete Complete  HRI or Home Care Consult Complete Patient refused Complete  SW Recovery Care/Counseling Consult Complete Complete Patient refused  Palliative Care Screening Not Applicable Not Applicable Not Applicable   Skilled Nursing Facility Not Applicable Not Applicable Not Applicable

## 2022-10-12 NOTE — Consult Note (Addendum)
WOC Nurse wound follow up Wound type: surgical  Measurement: 7 cms x 4 cms x 1 cm  Wound bed:80% red moist 20% yellow slough (mostly at superior portion of wound) Drainage (amount, consistency, odor) minimal serosanguinous  Periwound: intact  Dressing procedure/placement/frequency: Removed old NPWT dressing Cleansed wound with normal saline Filled wound with 1  piece of black foam; cut a 2" barrier ring in half and placed 1/2 at distal portion of wound (near groin) and 1/2 at superior portion of wound to assist with seal.   Sealed NPWT dressing at HG Patient received IV pain medication per bedside nurse prior to dressing change and tolerated fair   WOC nurse will continue to provide NPWT dressing changes due to the complexity of the dressing change. Next dressing change Thursday 10/15/2022.    Two medium black foam dressing kits and 4 barrier rings ordered for room.    While WOC nurse in room patient had soiled and was being cleaned up.  Did stay and assist with clean up and assess sacral wound.    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  Continue with Santyl daily to sacral wound. (performed by WOC nurse at this visit) and hydrotherapy as per their schedule.   2.  Bilateral ischium clean with NS, Apply single layer Xeroform gauze [place] [phone]) daily.  Cover with silicone foam.  May lift foam dressing daily to reapply Xeroform. Change foam dressing q3 days and prn soiling.   Patient on a low air loss mattress for pressure redistribution and moisture management.    WOC team will continue to  follow patient as above.   Thank you,   Priscella Mann MSN, RN-BC, [employer] [phone]

## 2022-10-12 NOTE — Progress Notes (Signed)
[facility] Progress Note   Subjective:   Reports feeling fine today, denies any concerns. Denies SOB, CP, dizziness, nausea.   Objective Vitals:   10/11/22 1737 10/11/22 1938 10/12/22 0531 10/12/22 0818  BP: (!) 160/69 (!) 165/83 (!) 169/93 (!) 146/54  Pulse: 95 81 90 95  Resp: 15 20 20 20   Temp: 98.1 F (36.7 C) 98 F (36.7 C) 98 F (36.7 C) 98.3 F (36.8 C)  TempSrc: Oral Oral Oral Oral  SpO2: 99% 98%  96%  Weight:      Height:       Physical Exam General: Alert, chronically ill appearing male in NAD Heart: RRR, no murmurs, rubs or gallops Lungs: CTA bilaterally Abdomen: Soft, non-distended, +BS Extremities: Ischemic toes on left foot. No edema b/l lower extremities Dialysis Access:  [facility]  Additional Objective Labs: Basic Metabolic Panel: Recent Labs  Lab 10/10/22 0020 10/11/22 0621 10/12/22 0605  NA 138 135 137  K 4.4 4.3 4.8  CL 96* 94* 95*  CO2 26 26 25   GLUCOSE 63* 89 94  BUN 64* 57* 95*  CREATININE 4.59* 4.39* 5.72*  CALCIUM 8.8* 8.5* 9.3  PHOS 4.2 4.2 4.3   Liver Function Tests: Recent Labs  Lab 10/10/22 0020 10/11/22 0621 10/12/22 0605  ALBUMIN 1.9* 1.8* 1.8*   No results for input(s): "LIPASE", "AMYLASE" in the last 168 hours. CBC: Recent Labs  Lab 10/06/22 0914 10/06/22 1725 10/10/22 1741 10/11/22 0621 10/12/22 0605  WBC 13.4*  --   --   --   --   HGB 7.3*   < > 8.4* 7.7* 7.6*  HCT 23.3*   < > 27.4* 25.1* 23.7*  MCV 98.7  --   --   --   --   PLT 226  --   --   --   --    < > = values in this interval not displayed.   Blood Culture    Component Value Date/Time   SDES TISSUE 09/09/2022 1453   SPECREQUEST VESSEL PT ON VANC ANCEF 09/09/2022 1453   CULT  09/09/2022 1453    RARE STAPHYLOCOCCUS AUREUS SUSCEPTIBILITIES PERFORMED ON PREVIOUS CULTURE WITHIN THE LAST 5 DAYS. NO ANAEROBES ISOLATED Performed at [facility], [address] [place], [place] [address]    REPTSTATUS 09/14/2022 FINAL 09/09/2022 1453     Cardiac Enzymes: Recent Labs  Lab 10/12/22 0605  CKTOTAL 20*   CBG: No results for input(s): "GLUCAP" in the last 168 hours. Iron Studies: No results for input(s): "IRON", "TIBC", "TRANSFERRIN", "FERRITIN" in the last 72 hours. @lablastinr3 @ Studies/Results: No results found. Medications:  sodium chloride 0 mL/hr at 09/22/22 0757   DAPTOmycin (CUBICIN) 600 mg in sodium chloride 0.9 % IVPB 600 mg (10/08/22 2008)   DAPTOmycin (CUBICIN) 750 mg in sodium chloride 0.9 % IVPB 750 mg (10/10/22 1808)   sodium chloride      (feeding supplement) PROSource Plus  30 mL Oral TID WC   acetaminophen  650 mg Oral Q6H   ascorbic acid  250 mg Oral BID   budesonide  9 mg Oral Daily   calcium acetate  1,334 mg Oral TID WC   Chlorhexidine Gluconate Cloth  6 each Topical Q0600   Chlorhexidine Gluconate Cloth  6 each Topical Q0600   collagenase   Topical Daily   darbepoetin (ARANESP) injection - DIALYSIS  200 mcg Subcutaneous Q Wed-1800   liver oil-zinc oxide   Topical TID   methocarbamol  500 mg Oral TID  metoprolol succinate  25 mg Oral Daily   multivitamin  1 tablet Oral QHS   nutrition supplement (JUVEN)  1 packet Oral BID BM   pantoprazole  40 mg Oral BID   polyethylene glycol  17 g Oral Daily   rosuvastatin  10 mg Oral Daily   sodium chloride flush  10-40 mL Intracatheter Q12H   vitamin A  10,000 Units Oral Daily    Dialysis Orders: OP HD: TTS SW  4h   350/800  64.5kg  new TDC (RFA AVG clotted)  Heparin none  - last OP HD 3/14, post wt 65.9kg - venofer 50mg  IV weekly - rocaltrol 0.25 mcg po tiw - phoslo 2 ac tid - no esa, last Hb 11.0    Assessment/Plan: Recurrent lower GIB: S/p colonoscopy 09/15/22, then repeat sigmoidoscopy 4/11. Several bleeding ulcers addressed.  Path came back + Crohn's - started on mesalamine by GI. Brisk re-bleed 4/18, took again for emergent sigmoidoscopy with hemostatic gel applied to bleeding ulcer. Then bled again from his rectum/ anus requiring  suturing of bleeding ulcers 4/20 by Dr. Magnus Ivan. Hb slowly drifting down Left common femoral artery pseudoaneurysm - s/p L femoral exploration with pseudoaneurysm repair 08/29/22. SP iliofemoral bypass, removal of infected patch with sartorius muscle flap 09/09/22. Ischemic changes to L 3rd-5th toes persist.  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 Neuro signed off.  ESRD: sp short-term CRRT 3/23- 3/26, now back on iHD on TTS schedule. Next HD 5/07.  BP/volume: clear lungs, no edema on exam. Bed weights are likely inaccurate Anemia (ESRD + ABLA): Last transfused 1U on 4/25 (total of 25U this admit). Also on Aranesp q Wed (max dose). Hb 7- 8 range.  Secondary HPTH: CorrCa high, phos at goal. Holding VDRA, continue phoslow for now.  Dialysis access: AVG clotted 3/27, went for declot by VVS and then re-clotted. VVS placed R TDC on 4/1.  Hypoalbuminemia - Alb 1.8, in hospital > 30 days. Palliative care has revisited, see their notes. Pt has not been responsive to suggestions like DNR, etc. Remains full code.   Rogers Blocker, PA-C 10/12/2022, 10:07 AM  [facility] Pager: [phone]

## 2022-10-12 NOTE — Progress Notes (Signed)
Physical Therapy Treatment Patient Details Name: Albert Harris MRN: [id number] DOB: December 16, 1955 Today's Date: 10/12/2022   History of Present Illness 67 year old male  ER with abdominal pain, nausea, diaphoresis and unable to feel his Lt foot. Hypotensive with lactic acidosis in ER.  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 CRRT 3/22-3/26.  3/22 repair of distal Lt external iliac artery. On 3/30, Hgb drop from 9 to 7 with worsening abdominal pain, CT abd 3/30 shows slightly larger anterior pelvic extraperitoneal hematoma. 4/18 -  massive hemoptysis requiring 4 units PRBC, bedside flex sig with bleeding ulcer, hemostatic gel applied, ETT 4/18-19. PMHx: PAD, ESRD HD, recently hospitalized 3/16-18 for acute on chronic left lower extremity ischemia.    PT Comments    Received pt semi-reclined in bed with family present at bedside. Pt reported increased pain along buttocks from hydrotherapy and initially refusing participation in session. Pt politely refused sitting EOB due to bed's inability to get low enough to allow him to place his feet on the floor. Pt also politely refusing any transfers/OOB mobility due to pain in bilateral feet. Pt agreed to attempt bed level exercises (see below), but ultimately limited by pain and increased "burning" and hypersensitivity in feet with exercises. Offered to assist with repositioning in bed for comfort, however pt already had pillow placed under L buttocks and declined any additional repositioning. Pt requesting pain meds at end of session - RN notified.    Recommendations for follow up therapy are one component of a multi-disciplinary discharge planning process, led by the attending physician.  Recommendations may be updated based on patient status, additional functional criteria and insurance authorization.  Follow Up Recommendations  Can patient physically be transported by private vehicle: No    Assistance Recommended at Discharge  Frequent or constant Supervision/Assistance  Patient can return home with the following Assist for transportation;Assistance with cooking/housework;Help with stairs or ramp for entrance;A lot of help with walking and/or transfers;A lot of help with bathing/dressing/bathroom   Equipment Recommendations  Other (comment) (TBD; currently would need mechanical lift, hospital bed with air mattress and reclining back wheelchair with roho cushion and elevating leg rests)    Recommendations for Other Services       Precautions / Restrictions Precautions Precautions: Fall Precaution Comments: L groin wound vac; Per verbal order of Dr. Chestine Spore and Dr. Lenell Antu 4/19 - unrestricted L hip ROM, pt appropriate to move OOB and mobilize as able. Restrictions Weight Bearing Restrictions: No LLE Weight Bearing: Weight bearing as tolerated     Mobility  Bed Mobility               General bed mobility comments: pt politely refused    Transfers                   General transfer comment: pt politely refused    Ambulation/Gait                   Stairs             Wheelchair Mobility    Modified Rankin (Stroke Patients Only)       Balance                                            Cognition Arousal/Alertness: Awake/alert Behavior During Therapy: WFL for tasks assessed/performed Overall Cognitive Status: Within Functional  Limits for tasks assessed                                 General Comments: Pt initally declining participation due to recently finishing hydrotherapy and having increased pain. Pt politely declined sitting EOB due to inability of bed to reach low enough position for pt to place feet on floor. Pt politely refused any attempts at OOB mobility due to pain in bilateral feet, but agreed to attempt bed level exercises.        Exercises General Exercises - Lower Extremity Ankle Circles/Pumps: AROM, Right, 10 reps,  Supine Straight Leg Raises: AROM, Right, 5 reps, Supine    General Comments        Pertinent Vitals/Pain Pain Assessment Pain Assessment: 0-10 Faces Pain Scale: Hurts whole lot Facial Expression: Grimacing Pain Location: buttocks, BLEs Pain Descriptors / Indicators: Grimacing, Guarding, Sore, Constant, Discomfort, Burning Pain Intervention(s): Limited activity within patient's tolerance, Monitored during session, Repositioned, Patient requesting pain meds-RN notified    Home Living                          Prior Function            PT Goals (current goals can now be found in the care plan section) Acute Rehab PT Goals Patient Stated Goal: Less pain so I can get better and go home. PT Goal Formulation: With patient/family Time For Goal Achievement: 09/30/22 Potential to Achieve Goals: Fair    Frequency    Min 2X/week      PT Plan Current plan remains appropriate    Co-evaluation              AM-PAC PT "6 Clicks" Mobility   Outcome Measure                   End of Session   Activity Tolerance: Patient limited by pain Patient left: in bed;with call bell/phone within reach;with family/visitor present Nurse Communication: Mobility status;Patient requests pain meds PT Visit Diagnosis: Other abnormalities of gait and mobility (R26.89);Muscle weakness (generalized) (M62.81);Difficulty in walking, not elsewhere classified (R26.2);Pain Pain - part of body:  (buttocks and legs)     Time: 4098-1191 PT Time Calculation (min) (ACUTE ONLY): 14 min  Charges:  $Therapeutic Exercise: 8-22 mins                     Blima Rich PT, DPT  Marlana Salvage Zaunegger 10/12/2022, 11:53 AM

## 2022-10-13 DIAGNOSIS — E872 Acidosis, unspecified: Secondary | ICD-10-CM | POA: Diagnosis not present

## 2022-10-13 DIAGNOSIS — A419 Sepsis, unspecified organism: Secondary | ICD-10-CM | POA: Diagnosis not present

## 2022-10-13 DIAGNOSIS — R6521 Severe sepsis with septic shock: Secondary | ICD-10-CM | POA: Diagnosis not present

## 2022-10-13 DIAGNOSIS — R1084 Generalized abdominal pain: Secondary | ICD-10-CM | POA: Diagnosis not present

## 2022-10-13 LAB — CBC
HCT: 23.6 % — ABNORMAL LOW (ref 39.0–52.0)
Hemoglobin: 7.2 g/dL — ABNORMAL LOW (ref 13.0–17.0)
MCH: 30.9 pg (ref 26.0–34.0)
MCHC: 30.5 g/dL (ref 30.0–36.0)
MCV: 101.3 fL — ABNORMAL HIGH (ref 80.0–100.0)
Platelets: 275 10*3/uL (ref 150–400)
RBC: 2.33 MIL/uL — ABNORMAL LOW (ref 4.22–5.81)
RDW: 19.4 % — ABNORMAL HIGH (ref 11.5–15.5)
WBC: 13.8 10*3/uL — ABNORMAL HIGH (ref 4.0–10.5)
nRBC: 0 % (ref 0.0–0.2)

## 2022-10-13 LAB — RENAL FUNCTION PANEL
Albumin: 1.8 g/dL — ABNORMAL LOW (ref 3.5–5.0)
Anion gap: 16 — ABNORMAL HIGH (ref 5–15)
BUN: 129 mg/dL — ABNORMAL HIGH (ref 8–23)
CO2: 24 mmol/L (ref 22–32)
Calcium: 9.2 mg/dL (ref 8.9–10.3)
Chloride: 95 mmol/L — ABNORMAL LOW (ref 98–111)
Creatinine, Ser: 7.23 mg/dL — ABNORMAL HIGH (ref 0.61–1.24)
GFR, Estimated: 8 mL/min — ABNORMAL LOW (ref >60)
Glucose, Bld: 95 mg/dL (ref 70–99)
Phosphorus: 4.4 mg/dL (ref 2.5–4.6)
Potassium: 5.6 mmol/L — ABNORMAL HIGH (ref 3.5–5.1)
Sodium: 135 mmol/L (ref 135–145)

## 2022-10-13 MED ORDER — ALTEPLASE 2 MG IJ SOLR: 2.0000 mg | Freq: Once | INTRAMUSCULAR | Status: DC | PRN

## 2022-10-13 MED ORDER — PENTAFLUOROPROP-TETRAFLUOROETH EX AERO: 1.0000 | INHALATION_SPRAY | CUTANEOUS | Status: DC | PRN

## 2022-10-13 MED ORDER — ANTICOAGULANT SODIUM CITRATE 4% (200MG/5ML) IV SOLN: 5.0000 mL | Status: DC | PRN

## 2022-10-13 MED ORDER — HEPARIN SODIUM (PORCINE) 1000 UNIT/ML IJ SOLN
INTRAMUSCULAR | Status: AC
  Administered 2022-10-13: 3200 [IU]
  Filled 2022-10-13: qty 4

## 2022-10-13 NOTE — Progress Notes (Signed)
Received patient in bed.Awake,alert and oriented x 4.  Access used : Left Hd catheter .Dressing changed done today.  Medicine given: Oxy contin 5 mg.  Fluid removed: 1.5 liters.  Duration of treatment: 3.5 hours.  Hemo issue : Tolerated treatment well.  Hand off to the patient's nurse.

## 2022-10-13 NOTE — Progress Notes (Signed)
     Referral previously received for Albert Harris for goals of care discussion. Chart reviewed and updates received from RN. See previous palliative medicine notes.   Palliative medicine has made multiple attempts to facilitate discussion between patient and family on goals of care.  The patient continuously declines our involvement in these discussions, also apparently not having these discussions.  At this point his goals team to remain full code and full scope of care.  We will sign off for now, will remove consult order. Please contact us and enter a new consult order if we can be of further assistance.   Thank you for your referral and allowing PMT to assist in [place]'s care.   Wynne Dust, NP Palliative Medicine Team Phone: [phone]  NO CHARGE

## 2022-10-13 NOTE — Progress Notes (Signed)
Physical Therapy Wound Treatment Patient Details  Name: Albert Harris MRN: [id number] Date of Birth: 04-24-1956  Today's Date: 10/13/2022 Time: 1400-1441 Time Calculation (min): 41 min  Subjective  Subjective Assessment Subjective: Pt pleasant and agreeable to wound therapy with premedication for pain Patient and Family Stated Goals: Heal wound Date of Onset:  (unknown) Prior Treatments: Dressing changes  Pain Score:   No pain during hydrotherapy at wound site; +pain/moaning with repositioning, primarily due to legs  Wound Assessment  Pressure Injury 08/28/22 Sacrum Medial Unstageable - Full thickness tissue loss in which the base of the injury is covered by slough (yellow, tan, gray, green or brown) and/or eschar (tan, brown or black) in the wound bed. Sacral wound with eschar coveri (Active)  Wound Image     10/13/22 1445  Dressing Type Foam - Lift dressing to assess site every shift;Gauze (Comment);Moist to dry;Santyl;Barrier Film (skin prep) 10/13/22 1445  Dressing Changed 10/13/22 1445  Dressing Change Frequency Daily 10/13/22 1445  State of Healing Eschar 10/13/22 1445  Site / Wound Assessment Pink;Painful;Black 10/13/22 1445  % Wound base Red or Granulating 60% 10/13/22 1445  % Wound base Yellow/Fibrinous Exudate 20% 10/13/22 1445  % Wound base Black/Eschar 20% 10/13/22 1445  % Wound base Other/Granulation Tissue (Comment) 0% 10/13/22 1445  Peri-wound Assessment Pink;Purple;Intact 10/13/22 1445  Wound Length (cm) 14 cm 10/13/22 1445  Wound Width (cm) 9.8 cm 10/13/22 1445  Wound Depth (cm) 4 cm 10/13/22 1445  Wound Surface Area (cm^2) 137.2 cm^2 10/13/22 1445  Wound Volume (cm^3) 548.8 cm^3 10/13/22 1445  Tunneling (cm) 0 09/29/22 1327  Undermining (cm) at 3:00 4.0 cm 10/13/22 1445  Margins Unattached edges (unapproximated) 10/13/22 1445  Drainage Amount Copious 10/13/22 1445  Drainage Description Serosanguineous 10/13/22 1445  Treatment Debridement  (Selective);Irrigation;Packing (Saline gauze) 10/13/22 1445      Selective Debridement (non-excisional) Selective Debridement (non-excisional) - Location: Sacrum Selective Debridement (non-excisional) - Tools Used: Forceps, Scalpel Selective Debridement (non-excisional) - Tissue Removed: yellow and black nonviable tissue    Wound Assessment and Plan  Wound Therapy - Assess/Plan/Recommendations Wound Therapy - Clinical Statement: Wound measurements increased compared to previous week. Continue to remove necrotic tissue with additional necrotic tissue adherent underneath. Amount of granulation tissue has not changed (60%) however the tissue is a deeper red, healthier in appearance. Continue hydrotherapy to remove necrotic tissue and promote healing. Wound Therapy - Functional Problem List: Decreased tolerance for repositioning and OOB due to wound; global weakness in the setting of extended hospitalization and now ICU stay. Factors Delaying/Impairing Wound Healing: Immobility, Multiple medical problems, Polypharmacy Hydrotherapy Plan: Debridement, Dressing change, Patient/family education Wound Therapy - Frequency: 2X / week Wound Therapy - Follow Up Recommendations: dressing changes by RN  Wound Therapy Goals- Improve the function of patient's integumentary system by progressing the wound(s) through the phases of wound healing (inflammation - proliferation - remodeling) by: Wound Therapy Goals - Improve the function of patient's integumentary system by progressing the wound(s) through the phases of wound healing by: Decrease Necrotic Tissue to: 20% Decrease Necrotic Tissue - Progress: Progressing toward goal Increase Granulation Tissue to: 80% Increase Granulation Tissue - Progress: Progressing toward goal Goals/treatment plan/discharge plan were made with and agreed upon by patient/family: Yes Time For Goal Achievement: 7 days Wound Therapy - Potential for Goals: Fair  Goals will be  updated until maximal potential achieved or discharge criteria met.  Discharge criteria: when goals achieved, discharge from hospital, MD decision/surgical intervention, no progress towards goals, refusal/missing three consecutive treatments  without notification or medical reason.  GP     Charges PT Wound Care Charges $Wound Debridement up to 20 cm: < or equal to 20 cm $ Wound Debridement each add'l 20 sqcm: 2 $PT Hydrotherapy Visit: 1 Visit      [name], PT Acute Rehabilitation Services  Office [phone]   [name] 10/13/2022, 2:55 PM

## 2022-10-13 NOTE — Progress Notes (Signed)
[facility] Progress Note   Dialysis Orders: OP HD: TTS SW  4h   350/800  64.5kg  new TDC (RFA AVG clotted)  Heparin none  - last OP HD 3/14, post wt 65.9kg - venofer 50mg  IV weekly - rocaltrol 0.25 mcg po tiw - phoslo 2 ac tid - no esa, last Hb 11.0    Assessment/Plan: Recurrent lower GIB: S/p colonoscopy 09/15/22, then repeat sigmoidoscopy 4/11. Several bleeding ulcers addressed.  Path came back + Crohn's - started on mesalamine by GI. Brisk re-bleed 4/18, took again for emergent sigmoidoscopy with hemostatic gel applied to bleeding ulcer. Then bled again from his rectum/ anus requiring suturing of bleeding ulcers 4/20 by Dr. Magnus Ivan. Hb slowly drifting down Left common femoral artery pseudoaneurysm - s/p L femoral exploration with pseudoaneurysm repair 08/29/22. SP iliofemoral bypass, removal of infected patch with sartorius muscle flap 09/09/22. Ischemic changes to L 3rd-5th toes persist. 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 Neuro signed off.  ESRD: sp short-term CRRT 3/23- 3/26, now back on iHD on TTS schedule.  Seen on HD 2K bath RIJ TC 121/63 1.5L net UF He also has a functioning right FAL AVG A5 configuration.  Next HD 5/07 through the FAL.   BP/volume: clear lungs, no edema on exam. Bed weights are likely inaccurate Anemia (ESRD + ABLA): Last transfused 1U on 4/25 (total of 25U this admit). Also on Aranesp q Wed (max dose). Hb 7- 8 range.  Secondary HPTH: CorrCa high, phos at goal. Holding VDRA, continue phoslo for now.  Dialysis access: AVG clotted 3/27, went for declot by VVS and then re-clotted. VVS placed R TDC on 4/1 but there is a strong bruit in the AVG 5/7 (will try to use on Thur) Hypoalbuminemia - Alb 1.8, in hospital > 30 days. Palliative care has revisited, see their notes. Pt has not been  responsive to suggestions like DNR, etc. Remains full code.    Subjective:   Reports feeling fine today, denies any concerns. Denies SOB, CP, dizziness, nausea.   Objective Vitals:   10/13/22 0830 10/13/22 0832 10/13/22 0900 10/13/22 0930  BP: (!) 171/82 (!) 172/79 (!) 156/71 112/75  Pulse: 84 85 81 79  Resp: 20 18 15 14   Temp:      TempSrc:      SpO2: 100% 98% 99% 96%  Weight:      Height:       Physical Exam General: Alert, chronically ill appearing male in NAD Heart: RRR, no murmurs, rubs or gallops Lungs: CTA bilaterally Abdomen: Soft, non-distended, +BS Extremities: Ischemic toes on left foot. No edema b/l lower extremities Dialysis Access:  TDC, rt FAL good bruit (A5 configuration)  Additional Objective Labs: Basic Metabolic Panel: Recent Labs  Lab 10/11/22 0621 10/12/22 0605 10/13/22 0830  NA 135 137 135  K 4.3 4.8 5.6*  CL 94* 95* 95*  CO2 26 25 24   GLUCOSE 89 94 95  BUN 57* 95* 129*  CREATININE 4.39* 5.72* 7.23*  CALCIUM 8.5* 9.3 9.2  PHOS 4.2 4.3 4.4   Liver Function Tests: Recent Labs  Lab 10/11/22 0621 10/12/22 0605 10/13/22 0830  ALBUMIN 1.8* 1.8* 1.8*   No results for input(s): "LIPASE", "AMYLASE" in the last 168 hours. CBC: Recent Labs  Lab 10/11/22 0621 10/12/22 0605 10/13/22 0829  WBC  --   --  13.8*  HGB 7.7* 7.6* 7.2*  HCT 25.1* 23.7* 23.6*  MCV  --   --  101.3*  PLT  --   --  275   Blood Culture    Component Value Date/Time   SDES TISSUE 09/09/2022 1453   SPECREQUEST VESSEL PT ON VANC ANCEF 09/09/2022 1453   CULT  09/09/2022 1453    RARE STAPHYLOCOCCUS AUREUS SUSCEPTIBILITIES PERFORMED ON PREVIOUS CULTURE WITHIN THE LAST 5 DAYS. NO ANAEROBES ISOLATED Performed at [facility], [address] [place], [place] [address]    REPTSTATUS 09/14/2022 FINAL 09/09/2022 1453    Cardiac Enzymes: Recent Labs  Lab 10/12/22 0605  CKTOTAL 20*   CBG: No results for input(s): "GLUCAP" in the last 168 hours. Iron Studies:  No results for input(s): "IRON", "TIBC", "TRANSFERRIN", "FERRITIN" in the last 72 hours. @lablastinr3 @ Studies/Results: No results found. Medications:  sodium chloride 0 mL/hr at 09/22/22 0757   anticoagulant sodium citrate     DAPTOmycin (CUBICIN) 600 mg in sodium chloride 0.9 % IVPB 600 mg (10/08/22 2008)   DAPTOmycin (CUBICIN) 750 mg in sodium chloride 0.9 % IVPB 750 mg (10/10/22 1808)   sodium chloride      (feeding supplement) PROSource Plus  30 mL Oral TID WC   acetaminophen  650 mg Oral Q6H   ascorbic acid  250 mg Oral BID   budesonide  9 mg Oral Daily   calcium acetate  1,334 mg Oral TID WC   Chlorhexidine Gluconate Cloth  6 each Topical Q0600   Chlorhexidine Gluconate Cloth  6 each Topical Q0600   Chlorhexidine Gluconate Cloth  6 each Topical Q0600   collagenase   Topical Daily   darbepoetin (ARANESP) injection - DIALYSIS  200 mcg Subcutaneous Q Wed-1800   liver oil-zinc oxide   Topical TID   methocarbamol  500 mg Oral TID   metoprolol succinate  25 mg Oral Daily   multivitamin  1 tablet Oral QHS   nutrition supplement (JUVEN)  1 packet Oral BID BM   pantoprazole  40 mg Oral BID   polyethylene glycol  17 g Oral Daily   rosuvastatin  10 mg Oral Daily   sodium chloride flush  10-40 mL Intracatheter Q12H   vitamin A  10,000 Units Oral Daily

## 2022-10-13 NOTE — Progress Notes (Signed)
Progress Note   Patient: Albert Harris EXB:[id number] DOB: 04/19/1956 DOA: 08/28/2022     46 DOS: the patient was seen and examined on 10/13/2022   Brief hospital course: 67 year old male with history of ESRD on HD TTS, PVD,COPD, essential hypertension, chronic systolic congestive heart failure with ejection fraction 35 to 40%, presented with abdominal pain nausea and diaphoresis. Patient was also hypotensive with a lactic acidosis and was admitted to ICU,with a pulseless left lower extremity was found to have femoral pseudoaneurysm.Culture came back with MRSA bacteremia-TEE neg for vegetations.  08/29/22: -Admitted to ICU-on vasopressors for septic shock -s/p OR with Dr. Karin Lieu for left femoral exploration with pseudoaneurysm repair,hematoma evacuation>showed dehiscence at proximal aspect of bovine pericardial patch at the suture line.  -Emergent CRRT for severe hyperkalemia 09/08/22 Transferred to [facility] service  09/09/22 CTA for bleeding on left groin: showed Thrombosis of LEFT CFA pseudoaneurysm with new 3.5 cm filling defect, consistent with arterial thrombus, extending from the PSA neck and into the distal CFA. This is at risk for distal embolization and potential acute LEFT lower extremity ischemia.New 8 mm distal LEFT EIA pseudoaneurysm, medial to the stent distal landing zone. Mild interval enlargement of retroperitoneal hematoma, in a craniocaudal dimension measuring 12.2 cm (previously 11.2 cm).No CTA evidence of active extravasation. Patent RIGHT femoral-to-popliteal graft and bilateral iliac stents.Severe burden of splanchnic arterial atherosclerosis, at risk for chronic mesenteric ischemia. 09/09/22: urgent OR:S/P left common iliac to profunda femoris bypass with ipsilateral nonreversed greater saphenous vein in anatomic tunnel, left common iliac artery angioplasty and stenting, left greater saphenous vein harvest, left sartorius muscle of left groin, left lower extremity angiography with  first-order cannulation Transferred to 2H post op 09/12/22 am: 2 BRBPR> s/p Flex sig>showed 5 rectal ulcers, not actively bleeding- stool brown.advised to avoid constipation, added Miralax BID 09/13/22 AM: again 2 large bloody BM ~ 500cc> got 1 unit PRBC  Assessment and Plan: Acute blood loss anemia from bleeding anal/rectal ulcers possibly from his Crohn's disease - Patient underwent flex sigmoidoscopy on 4/6 was found to have 5 nonbleeding ulcers.  He also underwent colonoscopy which showed multiple rectal ulcers with visible bleeding underwent hemostasis. - Patient developed massive rectal bleeding again on 4/18 - Patient is not a candidate for IR intervention due to severe peripheral vascular disease and anatomy - General surgery was consulted, pt was taken to the OR  on 4/20 and underwent S/p EUA and over sewing of ulcers and packing anal canal with gelfoam by Dr. Magnus Ivan. - continue budesonide - Per last surgical notes there may be no options for intervention should the patient have any hemodynamically significant bleeding.  See surgical note from Dr. Magnus Ivan 4/21.  Poor prognosis.  Strongly recommend DNR status. -Had been transfusion dependent, however now hgb seems more stable -Palliative Care had been following. Now goal is dispo to SNF at d/c   PVD  Left common femoral artery pseudoaneurysm S/p left groin hematoma evacuation, primary repair of the distal external iliac artery. Further complicated on 4/3 by left groin bleeding with new pseudoaneurysm and arterial thrombus. S/p iliofemoral bypass and removal of infected patch with sartorius muscle flap. - Continue with wound VAC, had been undergoing hydrotherapy hydrotherapy    MRSA bacteremia secondary to postop wound infection with MRSA and Enterococcus faecium. Infectious disease note from 4/8 recommending daptomycin for 6 weeks followed by indefinite oral suppression of antibiotics. Monitor CPK weekly TEE is negative for  vegetations. Patient will need outpatient follow-up with infectious disease to determine oral  antibiotics. - continue daptomycin for now; Last dose of IV antibiotics on 10/23/22   ESRD on HD Nephrology on board and dialysis via right IJ tunneled catheter. Further HD sessions as per nephrology -Nephrology following.    Chronic systolic heart failure Last echocardiogram from January 2024 showed left ventricular ejection fraction of 35 to 40% Pulmonary hypertension Cannot restart antiplatelet agents at this time Continue fluid management as per dialysis.   Acute metabolic encephalopathy  - from uremia, acidosis and acute 4 mm right subinsular stroke -Now much improved, conversing appropriately   Retroperitoneal hemorrhage Appears to be stable    Moderate malnutrition - continue diet   Hypertension:  On metoprolol. Suspect a component of pain.  Continue with hydralazine as needed Cont analgesia as needed   COPD:  Stable, remains on  minimal O2  Uncontrolled back pain -Continued on multiple narcotics  -Recently given trial of oxycontin, however pt later noted to be more drowsy and encephalopathic, thus oxycontin was d/c'd - Advised pt to limit narcotics to only if absolutely needed -Appears comfortable today      Subjective: Without complaints today. Pt seen on HD  Physical Exam: Vitals:   10/13/22 1157 10/13/22 1219 10/13/22 1255 10/13/22 1645  BP: 104/68  132/68 121/73  Pulse: 79  94 78  Resp: 16  20 16   Temp: 98.4 F (36.9 C)  98.5 F (36.9 C) 98.8 F (37.1 C)  TempSrc:   Oral Oral  SpO2: 95%  98% 95%  Weight:  64 kg    Height:       General exam: Awake, laying in bed, in nad Respiratory system: Normal respiratory effort, no wheezing Cardiovascular system: regular rate, s1, s2 Gastrointestinal system: Soft, nondistended, positive BS Central nervous system: CN2-12 grossly intact, strength intact Extremities: Perfused, no clubbing Skin: Normal skin turgor,  no notable skin lesions seen Psychiatry: Mood normal // no visual hallucinations   Data Reviewed:  Labs reviewed: Na 135, K 5.6, Cr 7.23  Family Communication: Pt in room, family not at bedside  Disposition: Status is: Inpatient Remains inpatient appropriate because: severity of illness  Planned Discharge Destination: Skilled nursing facility    Author: Rickey Barbara, MD 10/13/2022 6:34 PM  For on call review [email or web address].

## 2022-10-13 NOTE — TOC Progression Note (Signed)
Transition of Care [facility]) - Progression Note    Patient Details  Name: TAO OGONOWSKI MRN: [id number] Date of Birth: 1955/12/03  Transition of Care [facility]) CM/SW Contact  Eduard Roux, [place] Phone Number: 10/13/2022, 8:16 AM  Clinical Narrative:     Received insurance authorization # [id number] 05/7-05/9   Updated [facility], informed will still need would vac.   TOC will continue to follow for medical readiness.   Antony Blackbird, MSW, LCSW Clinical Social Worker    Expected Discharge Plan: Skilled Nursing Facility Barriers to Discharge: Continued Medical Work up  Expected Discharge Plan and Services In-house Referral: Clinical Social Work   Post Acute Care Choice: Skilled Nursing Facility Living arrangements for the past 2 months: Single Family Home                                       Social Determinants of Health (SDOH) Interventions SDOH Screenings   Food Insecurity: No Food Insecurity (09/29/2022)  Recent Concern: Food Insecurity - Food Insecurity Present (08/22/2022)  Housing: Low Risk  (09/29/2022)  Transportation Needs: No Transportation Needs (09/29/2022)  Recent Concern: Transportation Needs - Unmet Transportation Needs (08/22/2022)  Utilities: Not At Risk (09/29/2022)  Alcohol Screen: Low Risk  (09/12/2019)  Depression (PHQ2-9): High Risk (05/07/2022)  Financial Resource Strain: Low Risk  (09/12/2019)  Stress: No Stress Concern Present (09/12/2019)  Tobacco Use: Medium Risk (09/29/2022)    Readmission Risk Interventions    08/24/2022   11:27 AM 08/14/2022   10:07 AM 07/24/2022   11:52 AM  Readmission Risk Prevention Plan  Transportation Screening Complete Complete Complete  Medication Review (RN Care Manager) Referral to Pharmacy Referral to Pharmacy Complete  PCP or Specialist appointment within 3-5 days of discharge  Complete Complete  HRI or Home Care Consult Complete Patient refused Complete  SW Recovery Care/Counseling Consult Complete Complete  Patient refused  Palliative Care Screening Not Applicable Not Applicable Not Applicable  Skilled Nursing Facility Not Applicable Not Applicable Not Applicable

## 2022-10-13 NOTE — Progress Notes (Signed)
Nutrition Follow-up  DOCUMENTATION CODES:   Non-severe (moderate) malnutrition in context of chronic illness  INTERVENTION:  Continue juven BID  Continue prosource TID  Encourage po intake  Continue Vitamin A  Continue Vitamin C  Continue rena-vit    NUTRITION DIAGNOSIS:   Moderate Malnutrition (in the context of chronic illness (ESRD)) related to poor appetite as evidenced by moderate fat depletion, severe muscle depletion.   GOAL:   Patient will meet greater than or equal to 90% of their needs -ongoing  MONITOR:   PO intake, Supplement acceptance, I & O's, Labs, Weight trends  REASON FOR ASSESSMENT:   Consult, Ventilator Enteral/tube feeding initiation and management (trickle tube feeds)  ASSESSMENT:   67 y.o. male admitted with abdominal pain, nausea, diaphoresis and unable to feel L foot d/t L femoral pseudoaneurysm. Pt was hypotensive with lactic acidosis upon admission. PMH significant for ESRD, PAD, COPD, HLD, HTN, HFrEF 35-40%.  Visited patient at bedside who reports he is eating well and has a good appetite. He was in a good deal of pain during visit but reports no nutrition issues and his family member at bedside agreed with him.   Labs: K+ 5.6, BUN 129, Cr 7.23 Meds: vitamin c, phoslo, protonix, desitin, miralax, crestor, NS, vitamin A, rena-vit  I/O's: -2 L   Diet Order:   Diet Order             Diet regular Room service appropriate? Yes; Fluid consistency: Thin; Fluid restriction: 1200 mL Fluid  Diet effective now                   EDUCATION NEEDS:   Education needs have been addressed  Skin:  Skin Assessment: Skin Integrity Issues: Skin Integrity Issues:: Unstageable, Other (Comment) DTI: R/L heel Stage II: n/a Unstageable: sacrum (DTI that evlolved into unstageable)- receiving hydrotherapy, 8 cms x 13 cms x 1 cm 80% black tan devitalized tissue 20% pink moist Wound Vac: L groin Incisions: L groin (closed) Other: Buttocks and gluteal  crease/upper thighs with scattered areas of partial thickness and full thickness skin loss  Last BM:  5/4  Height:   Ht Readings from Last 1 Encounters:  09/26/22 5' 7.99" (1.727 m)    Weight:   Wt Readings from Last 1 Encounters:  10/13/22 64 kg    Ideal Body Weight:  70 kg  BMI:  Body mass index is 21.46 kg/m.  Estimated Nutritional Needs:   Kcal:  1900-2100  Protein:  110-130 g  Fluid:  1L + UOP    Leodis Rains, RDN, LDN  Clinical Nutrition

## 2022-10-14 DIAGNOSIS — A419 Sepsis, unspecified organism: Secondary | ICD-10-CM | POA: Diagnosis not present

## 2022-10-14 DIAGNOSIS — R6521 Severe sepsis with septic shock: Secondary | ICD-10-CM | POA: Diagnosis not present

## 2022-10-14 LAB — COMPREHENSIVE METABOLIC PANEL
ALT: 16 U/L (ref 0–44)
AST: 18 U/L (ref 15–41)
Albumin: 1.7 g/dL — ABNORMAL LOW (ref 3.5–5.0)
Alkaline Phosphatase: 111 U/L (ref 38–126)
Anion gap: 16 — ABNORMAL HIGH (ref 5–15)
BUN: 65 mg/dL — ABNORMAL HIGH (ref 8–23)
CO2: 27 mmol/L (ref 22–32)
Calcium: 9.3 mg/dL (ref 8.9–10.3)
Chloride: 92 mmol/L — ABNORMAL LOW (ref 98–111)
Creatinine, Ser: 3.75 mg/dL — ABNORMAL HIGH (ref 0.61–1.24)
GFR, Estimated: 17 mL/min — ABNORMAL LOW (ref >60)
Glucose, Bld: 111 mg/dL — ABNORMAL HIGH (ref 70–99)
Potassium: 4.7 mmol/L (ref 3.5–5.1)
Sodium: 135 mmol/L (ref 135–145)
Total Bilirubin: 1.1 mg/dL (ref 0.3–1.2)
Total Protein: 5.5 g/dL — ABNORMAL LOW (ref 6.5–8.1)

## 2022-10-14 LAB — CBC
HCT: 25.8 % — ABNORMAL LOW (ref 39.0–52.0)
Hemoglobin: 8 g/dL — ABNORMAL LOW (ref 13.0–17.0)
MCH: 30.8 pg (ref 26.0–34.0)
MCHC: 31 g/dL (ref 30.0–36.0)
MCV: 99.2 fL (ref 80.0–100.0)
Platelets: 291 10*3/uL (ref 150–400)
RBC: 2.6 MIL/uL — ABNORMAL LOW (ref 4.22–5.81)
RDW: 19.5 % — ABNORMAL HIGH (ref 11.5–15.5)
WBC: 14.3 10*3/uL — ABNORMAL HIGH (ref 4.0–10.5)
nRBC: 0 % (ref 0.0–0.2)

## 2022-10-14 MED ORDER — CHLORHEXIDINE GLUCONATE CLOTH 2 % EX PADS
6.0000 | MEDICATED_PAD | Freq: Every day | CUTANEOUS | Status: DC
  Administered 2022-10-14 – 2022-10-18 (×5): 6 via TOPICAL

## 2022-10-14 MED ORDER — MESALAMINE 1000 MG RE SUPP
1000.0000 mg | Freq: Every day | RECTAL | Status: DC
  Administered 2022-10-14 – 2022-10-21 (×8): 1000 mg via RECTAL
  Filled 2022-10-14 (×9): qty 1

## 2022-10-14 MED ORDER — CALCIUM ACETATE (PHOS BINDER) 667 MG PO CAPS
667.0000 mg | ORAL_CAPSULE | Freq: Three times a day (TID) | ORAL | Status: DC
  Administered 2022-10-14 – 2022-10-21 (×20): 667 mg via ORAL
  Filled 2022-10-14 (×20): qty 1

## 2022-10-14 MED ORDER — HYDROMORPHONE HCL 1 MG/ML IJ SOLN
0.5000 mg | INTRAMUSCULAR | Status: DC | PRN
  Administered 2022-10-14 – 2022-10-18 (×14): 0.5 mg via INTRAVENOUS
  Filled 2022-10-14 (×17): qty 0.5

## 2022-10-14 NOTE — Progress Notes (Signed)
[facility] Progress Note   Subjective:   Reports his feet are hurting this AM, also had wound care so sacral area is hurting. Denies SOB, CP, dizziness, nausea. Reports poor appetite.   Objective Vitals:   10/13/22 1219 10/13/22 1255 10/13/22 1645 10/13/22 1949  BP:  132/68 121/73 119/72  Pulse:  94 78 89  Resp:  20 16 (!) 22  Temp:  98.5 F (36.9 C) 98.8 F (37.1 C) 98.4 F (36.9 C)  TempSrc:  Oral Oral Oral  SpO2:  98% 95% 93%  Weight: 64 kg     Height:       Physical Exam General: Alert, frail appearing male in NAD Heart: RRR, no murmurs, rubs or gallops Lungs: CTA bilaterally  Abdomen: Soft, non-distended, +BS Extremities: Ischemic toes on left foot. No edema b/l lower extremities Dialysis Access: [facility], AVG +bruit  Additional Objective Labs: Basic Metabolic Panel: Recent Labs  Lab 10/11/22 0621 10/12/22 0605 10/13/22 0830 10/14/22 0112  NA 135 137 135 135  K 4.3 4.8 5.6* 4.7  CL 94* 95* 95* 92*  CO2 26 25 24 27   GLUCOSE 89 94 95 111*  BUN 57* 95* 129* 65*  CREATININE 4.39* 5.72* 7.23* 3.75*  CALCIUM 8.5* 9.3 9.2 9.3  PHOS 4.2 4.3 4.4  --    Liver Function Tests: Recent Labs  Lab 10/12/22 0605 10/13/22 0830 10/14/22 0112  AST  --   --  18  ALT  --   --  16  ALKPHOS  --   --  111  BILITOT  --   --  1.1  PROT  --   --  5.5*  ALBUMIN 1.8* 1.8* 1.7*   No results for input(s): "LIPASE", "AMYLASE" in the last 168 hours. CBC: Recent Labs  Lab 10/12/22 0605 10/13/22 0829 10/14/22 0112  WBC  --  13.8* 14.3*  HGB 7.6* 7.2* 8.0*  HCT 23.7* 23.6* 25.8*  MCV  --  101.3* 99.2  PLT  --  275 291   Blood Culture    Component Value Date/Time   SDES TISSUE 09/09/2022 1453   SPECREQUEST VESSEL PT ON VANC ANCEF 09/09/2022 1453   CULT  09/09/2022 1453    RARE STAPHYLOCOCCUS AUREUS SUSCEPTIBILITIES PERFORMED ON PREVIOUS CULTURE WITHIN THE LAST 5 DAYS. NO ANAEROBES ISOLATED Performed at [facility], [address], [place],  [place] [address]    REPTSTATUS 09/14/2022 FINAL 09/09/2022 1453    Cardiac Enzymes: Recent Labs  Lab 10/12/22 0605  CKTOTAL 20*   CBG: No results for input(s): "GLUCAP" in the last 168 hours. Iron Studies: No results for input(s): "IRON", "TIBC", "TRANSFERRIN", "FERRITIN" in the last 72 hours. @lablastinr3 @ Studies/Results: No results found. Medications:  sodium chloride 0 mL/hr at 09/22/22 0757   DAPTOmycin (CUBICIN) 600 mg in sodium chloride 0.9 % IVPB 600 mg (10/13/22 1750)   DAPTOmycin (CUBICIN) 750 mg in sodium chloride 0.9 % IVPB 750 mg (10/10/22 1808)   sodium chloride      (feeding supplement) PROSource Plus  30 mL Oral TID WC   acetaminophen  650 mg Oral Q6H   ascorbic acid  250 mg Oral BID   budesonide  9 mg Oral Daily   calcium acetate  1,334 mg Oral TID WC   Chlorhexidine Gluconate Cloth  6 each Topical Q0600   Chlorhexidine Gluconate Cloth  6 each Topical Q0600   Chlorhexidine Gluconate Cloth  6 each Topical Q0600   collagenase   Topical Daily   darbepoetin (ARANESP)  injection - DIALYSIS  200 mcg Subcutaneous Q Wed-1800   liver oil-zinc oxide   Topical TID   methocarbamol  500 mg Oral TID   metoprolol succinate  25 mg Oral Daily   multivitamin  1 tablet Oral QHS   nutrition supplement (JUVEN)  1 packet Oral BID BM   pantoprazole  40 mg Oral BID   polyethylene glycol  17 g Oral Daily   rosuvastatin  10 mg Oral Daily   sodium chloride flush  10-40 mL Intracatheter Q12H   vitamin A  10,000 Units Oral Daily    Dialysis Orders: OP HD: TTS SW  4h   350/800  64.5kg  new TDC (RFA AVG clotted)  Heparin none  - last OP HD 3/14, post wt 65.9kg - venofer 50mg  IV weekly - rocaltrol 0.25 mcg po tiw - phoslo 2 ac tid - no esa, last Hb 11.0  Assessment/Plan: Recurrent lower GIB: S/p colonoscopy 09/15/22, then repeat sigmoidoscopy 4/11. Several bleeding ulcers addressed.  Path came back + Crohn's - started on mesalamine by GI. Brisk re-bleed 4/18, took again for emergent  sigmoidoscopy with hemostatic gel applied to bleeding ulcer. Then bled again from his rectum/ anus requiring suturing of bleeding ulcers 4/20 by Dr. Magnus Ivan. Hb stable at 8.0 today.  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 SP iliofemoral bypass, removal of infected patch with sartorius muscle flap 09/09/22. Ischemic changes to L 3rd-5th toes persist. 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 Neuro signed off.  ESRD: sp short-term CRRT 3/23- 3/26, now back on iHD on TTS schedule.  Seen on HD 2K bath RIJ TC 121/63 1.5L net UF He also has a functioning right FAL AVG A5 configuration, will try to use next HD   BP/volume: clear lungs, no edema on exam. Bed weights are likely inaccurate Anemia (ESRD + ABLA): Last transfused 1U on 4/25 (total of 25U this admit). Also on Aranesp q Wed (max dose). Hb 7- 8 range.  Secondary HPTH: CorrCa high, phos at goal. Holding VDRA, will decrease phoslo dose Dialysis access: AVG clotted 3/27, went for declot by VVS and then re-clotted. VVS placed R TDC on 4/1 but there is a strong bruit in the AVG 5/7 (will try to use on Thur) Hypoalbuminemia - Alb 1.7, in hospital > 30 days. Palliative care has revisited, see their notes. Pt has not been responsive to suggestions like DNR, etc. Remains full code.   Rogers Blocker, PA-C 10/14/2022, 7:15 AM  [facility] Pager: [phone]

## 2022-10-14 NOTE — Progress Notes (Signed)
Occupational Therapy Treatment Patient Details Name: Albert Harris MRN: [id number] DOB: 1955-11-03 Today's Date: 10/14/2022   History of present illness 67 year old male  ER with abdominal pain, nausea, diaphoresis and unable to feel his Lt foot. Hypotensive with lactic acidosis in ER.  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 CRRT 3/22-3/26.  3/22 repair of distal Lt external iliac artery. On 3/30, Hgb drop from 9 to 7 with worsening abdominal pain, CT abd 3/30 shows slightly larger anterior pelvic extraperitoneal hematoma. 4/18 -  massive hemoptysis requiring 4 units PRBC, bedside flex sig with bleeding ulcer, hemostatic gel applied, ETT 4/18-19. PMHx: PAD, ESRD HD, recently hospitalized 3/16-18 for acute on chronic left lower extremity ischemia.   OT comments  Pt with no progress towards OT goals, 5/5 goals downgraded accordingly. Pt remains limited by significant pain in buttocks, back and L foot as well as deconditioning from prolonged hospitalization. Emphasis on pt independent use of bed controls for repositioning, upright sitting tolerance pulling to long sitting w/ bedrails briefly and UE HEP completion outside of therapy sessions to maintain strength. Will reduce OT frequency to 1x/wk until pt able to better participate.   Recommendations for follow up therapy are one component of a multi-disciplinary discharge planning process, led by the attending physician.  Recommendations may be updated based on patient status, additional functional criteria and insurance authorization.    Assistance Recommended at Discharge Frequent or constant Supervision/Assistance  Patient can return home with the following  A lot of help with walking and/or transfers;A lot of help with bathing/dressing/bathroom;Assistance with cooking/housework;Assist for transportation;Help with stairs or ramp for entrance   Equipment Recommendations  Wheelchair cushion (measurements OT);Wheelchair  (measurements OT);Hospital bed    Recommendations for Other Services      Precautions / Restrictions Precautions Precautions: Fall Precaution Comments: L groin wound vac; Per verbal order of Dr. Chestine Spore and Dr. Lenell Antu 4/19 - unrestricted L hip ROM, pt appropriate to move OOB and mobilize as able. Restrictions Weight Bearing Restrictions: Yes LLE Weight Bearing: Weight bearing as tolerated       Mobility Bed Mobility Overal bed mobility: Needs Assistance Bed Mobility: Rolling Rolling: Min assist         General bed mobility comments: able to bend R knee and reach tobedrail to pull self over w/ Min A to fully roll. increased back pain noted w/ this    Transfers                         Balance                                           ADL either performed or assessed with clinical judgement   ADL Overall ADL's : Needs assistance/impaired     Grooming: Set up;Bed level                                 General ADL Comments: Emphasis on bed controls for repositioning, brief upright sitting tolerance, pulling self to long sitting with bed rails and UE HEP completion outside of therapy    Extremity/Trunk Assessment Upper Extremity Assessment Upper Extremity Assessment: Generalized weakness   Lower Extremity Assessment Lower Extremity Assessment: Defer to PT evaluation        Vision  Vision Assessment?: No apparent visual deficits   Perception     Praxis      Cognition Arousal/Alertness: Awake/alert Behavior During Therapy: WFL for tasks assessed/performed Overall Cognitive Status: Within Functional Limits for tasks assessed                                          Exercises      Shoulder Instructions       General Comments Daughter at bedside,discussed limitations to therapy progress    Pertinent Vitals/ Pain       Pain Assessment Pain Assessment: Faces Faces Pain Scale: Hurts whole  lot Pain Location: back when rolling Pain Descriptors / Indicators: Grimacing, Guarding, Sore, Constant, Discomfort, Burning Pain Intervention(s): Limited activity within patient's tolerance, Monitored during session, Repositioned  Home Living                                          Prior Functioning/Environment              Frequency  Min 1X/week        Progress Toward Goals  OT Goals(current goals can now be found in the care plan section)  Progress towards OT goals: Not progressing toward goals - comment  Acute Rehab OT Goals Patient Stated Goal: pain control OT Goal Formulation: With patient Time For Goal Achievement: 10/28/22 Potential to Achieve Goals: Fair  Plan Frequency needs to be updated    Co-evaluation                 AM-PAC OT "6 Clicks" Daily Activity     Outcome Measure   Help from another person eating meals?: A Little Help from another person taking care of personal grooming?: A Little Help from another person toileting, which includes using toliet, bedpan, or urinal?: Total Help from another person bathing (including washing, rinsing, drying)?: A Lot Help from another person to put on and taking off regular upper body clothing?: A Lot Help from another person to put on and taking off regular lower body clothing?: Total 6 Click Score: 12    End of Session    OT Visit Diagnosis: Unsteadiness on feet (R26.81);Muscle weakness (generalized) (M62.81);Dizziness and giddiness (R42)   Activity Tolerance Patient limited by pain   Patient Left in bed;with call bell/phone within reach;with family/visitor present   Nurse Communication Mobility status        Time: 1610-9604 OT Time Calculation (min): 20 min  Charges: OT General Charges $OT Visit: 1 Visit OT Treatments $Therapeutic Activity: 8-22 mins  Bradd Canary, OTR/L Acute Rehab Services Office: [phone]   Lorre Munroe 10/14/2022, 2:18 PM

## 2022-10-14 NOTE — Progress Notes (Signed)
PROGRESS NOTE Albert Harris  ZOX:[id number] DOB: 20-Nov-1955 DOA: 08/28/2022 PCP: [name], NP  Brief Narrative/Hospital Course: 68 yom w/ complicated medical history including ESRD on HD TTS, PVD,COPD, essential hypertension, chronic systolic congestive heart failure with ejection fraction 35 to 40%, presented with abdominal pain nausea and diaphoresis. Patient was also hypotensive with a lactic acidosis and was admitted to ICU,with a pulseless left lower extremity was found to have femoral pseudoaneurysm.Culture came back with MRSA bacteremia-TEE neg for vegetations.  Patient had prolonged and complicated hospitalization course seen by multiple consultant as below: 08/29/22: Admitted to ICU-on vasopressors for septic shock -s/p OR with Dr. Karin Lieu for left femoral exploration with pseudoaneurysm repair,hematoma evacuation>showed dehiscence at proximal aspect of bovine pericardial patch at the suture line.  -Emergent CRRT for severe hyperkalemia 09/08/22 Transferred to [facility] service  09/09/22 CTA for bleeding on left groin: showed Thrombosis of LEFT CFA pseudoaneurysm with new 3.5 cm filling defect, consistent with arterial thrombus, extending from the PSA neck and into the distal CFA. This is at risk for distal embolization and potential acute LEFT lower extremity ischemia.New 8 mm distal LEFT EIA pseudoaneurysm, medial to the stent distal landing zone. Mild interval enlargement of retroperitoneal hematoma, in a craniocaudal dimension measuring 12.2 cm (previously 11.2 cm).No CTA evidence of active extravasation. Patent RIGHT femoral-to-popliteal graft and bilateral iliac stents.Severe burden of splanchnic arterial atherosclerosis, at risk for chronic mesenteric ischemia. 09/09/22: urgent OR:S/P left common iliac to profunda femoris bypass with ipsilateral nonreversed greater saphenous vein in anatomic tunnel, left common iliac artery angioplasty and stenting, left greater saphenous vein harvest, left  sartorius muscle of left groin, left lower extremity angiography with first-order cannulation Transferred to 2H post op 09/12/22 am: 2 BRBPR> s/p Flex sig>showed 5 rectal ulcers, not actively bleeding-stool brown-advised to avoid constipation, added Miralax BID 09/13/22 WJ:XBJYN 2 large bloody BM ~ 500cc> got 1 unit PRBC. 4/11 repeat sigmoidoscopy-several bleeding ulcers, pathology came back positive for Crohn's and started on mesalamine, 4/18: massive rebleeding-not a candidate for IR due to severe PVD s/p emergent sigmoidoscopy with hemostatic gel applied to bleeding ulcer 4/20: s/p suturing of bleeding ulcers by Dr. Magnus Ivan in OR. Palliative care has been involved and has had multiple discussions between patient and family on goals-remains full code with full scope of care and signed off 5/8. 5/7: Patient having progressive ischemic changes to the toes no plan for intervention at this time is to improve clinically prior to elective AKA and continuing on wound care hydrotherapy.   Subjective: Patient seen and examined this morning Is resting comfortably, his daughter is at the bedside He was more confused yesterday after dialysis possibly from pain meds? Overnight vitals stable, not hypoxic Labs show shows-persistent leukocytosis hemoglobin improved at 8.0.   Assessment and Plan: Active Problems:   Lower GI bleed   Acute blood loss anemia   ESRD (end stage renal disease) on dialysis (HCC)   Pseudoaneurysm of femoral artery (HCC)   Rectal arterial hemorrhage   Rectal ulcer   Hematochezia   MRSA bacteremia   Malnutrition of moderate degree   Decreased functional mobility   Pressure injury of skin   Colonic ulcer    Acute blood loss anemia recurrent hematochezia from anal/rectal ulcer- Crohns' disease: Patient had acute blood loss anemia with recurrent hematochezia from anal/rectal ulcer status post multiple sigmoidoscopy-normal bleeding after EGD and oversewing of ulcers and packing  and will schedule with Gelfoam-see chain of events above.  GI surgery signed off.  Plan is  to continue budesonide 9 mg daily. No longer on [place] since 4/22. S/p multiple transfusion ~ 40 units-Hb stable. Recent Labs    10/10/22 1741 10/11/22 0621 10/12/22 0605 10/13/22 0829 10/14/22 0112  HGB 8.4* 7.7* 7.6* 7.2* 8.0*  MCV  --   --   --  101.3* 99.2    Severe PVD Infected left femoral patch S/p ileofemoral bypass w/ sartorius muscle flap 4/3 Retroperitoneal hemorrhage Ischemic toes: Complicated vascular/surgical history -S/p left groin hematoma evacuation, primary repair of the distal external iliac artery. Further complicated on 4/3 by left groin bleeding with new pseudoaneurysm and arterial thrombus. S/p iliofemoral bypass and removal of infected patch with sartorius muscle flap. Patient having progressive ischemic changes to the toes no plan for intervention at this time is to improve clinically prior to elective AKA and continuing  to monitor. Has wound vac on left groin. See pic below  MRSA/Enterococcus faecium bacteremia secondary to postop wound infection: Status post removal of infected patch (see above). seen by ID>4/8 recommending daptomycin for 6 weeks  last day 10/23/22, will need oral suppression later- so need to contact ID at that time.TEE is negative for vegetations Monitor CPK weekly  Sacral pressure injury POA, unstageable/deep continue wound care, offloading bed mattress, position changing.  Continue hydrotherapy twice weekly per PT. will discuss about need to augment nutritional status and eat well.  ESRD on HD TTS, short-term CRRT on 3/23 - 3/26, nephrology following, RIJ TC +.  AVG clotted 3/27 status post declot by VVS then noted clotted, s/p IR TDC placed 4/1, having bruit in AVG 5/7-with plan to use AVG for upcoming HD Secondary hyperparathyroidism: Monitor calcium and Phos PTH.  Holding biliary, on PhosLo per nephrology Anemia ESRD and acute blood loss anemia see  #1-continue Aranesp q Wednesday Recent Labs    10/04/22 0448 10/05/22 0417 10/06/22 0913 10/07/22 0500 10/08/22 0753 10/10/22 0020 10/11/22 0621 10/12/22 0605 10/13/22 0830 10/14/22 0112  BUN 41* 70* 100* 56* 94* 64* 57* 95* 129* 65*  CREATININE 3.82* 5.26* 7.03* 4.42* 5.99* 4.59* 4.39* 5.72* 7.23* 3.75*  CO2 26 26 23 24 22 26 26 25 24  27    Chronic systolic CHF EF 35-40% previously-TEE 4/1: EF 50-55%.  Severe LVH.  Adjust fluid with dialysis Net IO Since Admission: 3,436.53 mL [10/14/22 1051]   Pulmonary hypertension-stable. Hypertension: BP stable on Toprol 25 Hyperlipidemia: LDL at goal 45 continue Crestor 10 mg  Acute metabolic encephalopathy: In the setting of uremia metabolic acidosis.  Has had extensive workup including MRI finding as below.  At times became sedated with opiates/pain medication.  Continue supportive care fall precaution PT OT as tolerated delirium precaution.  Incidental lacunar stroke:4 mm acute infarct in the right subinsular white matter on MRI 09/04/2022: Patient had altered mental status and left leg weakness-MRI ordered incidental lacunar infarct, seen by neurology completed stroke workup echo EF 45% carotid Doppler bilateral ICA 40-59%, LDL 45 at goal, HbA1c 5.1.  PTA on Plavix.  At this time unable to use antiplatelet due to recurrent bleeding.  COPD: Respiratory status stable  Pain management/uncontrolled back pain: Currently on tylenol 650 mg q6hr (since 4/19-monitor LFTs), on dilaudid 1 mg iv q4hr prn, oxy 5-10 mg q4hr/tramadol 50 mg q6hr. agreeable to cut down Dilaudid to 0.5 mg 5/8.  Will continue to wean down his opiates  Moderate malnutrition Hypoalbuminemia: RD following augment nutritional status, Nutrition Problem: Moderate Malnutrition (in the context of chronic illness (ESRD)) Etiology: poor appetite Signs/Symptoms: moderate fat depletion, severe muscle  depletion Interventions: Refer to RD note for recommendations, Liberalize Diet,  Nepro shake, MVI   Pressure injuries beside sacrum on left thigh, left heel DTI, right thigh see below-continue wound care Pressure Injury 10/10/22 Thigh Left;Posterior;Proximal Stage 2 -  Partial thickness loss of dermis presenting as a shallow open injury with a red, pink wound bed without slough. pink,skin pilled off  Pressure Injury 09/04/22 Heel Left Deep Tissue Pressure Injury - Purple or maroon localized area of discolored intact skin or blood-filled blister due to damage of underlying soft tissue from pressure and/or shear. Pressure Injury 10/10/22 Thigh Posterior;Proximal;Right Stage 2 -  Partial thickness loss of dermis presenting as a shallow open injury with a red, pink wound bed without slough. painful, bleeding (Active)  DVT prophylaxis: SCD's Start: 09/09/22 1841 Code Status:   Code Status: Full Code Family Communication: plan of care discussed with patient/daughter at bedside. Patient status is:  inpatient  because of deep sacral ulcer, left ischemic toes Level of care: Progressive   Dispo: The patient is from: home            Anticipated disposition: TBD  Objective: Vitals last 24 hrs: Vitals:   10/13/22 1645 10/13/22 1949 10/14/22 0600 10/14/22 0816  BP: 121/73 119/72  (!) 141/70  Pulse: 78 89  85  Resp: 16 (!) 22  14  Temp: 98.8 F (37.1 C) 98.4 F (36.9 C)  98.6 F (37 C)  TempSrc: Oral Oral  Oral  SpO2: 95% 93%  96%  Weight:   55 kg   Height:       Weight change: -1.5 kg  Physical Examination: General exam: alert awake, puffy face older than stated age HEENT:Oral mucosa moist, Ear/Nose WNL grossly Respiratory system: bilaterally CLEAR BS, no use of accessory muscle Cardiovascular system: S1 & S2 +, No JVD. Gastrointestinal system: Abdomen soft,NT,ND, BS+ Nervous System:Alert, awake, moving extremities. Extremities: Ischemic toes on the left, wound VAC on the left groin  Skin: No rashes,no icterus. MSK: Normal muscle bulk,tone, power TDC+ on left  chest Rt arm w/ AVG w.  Fluid thrill.   Medications reviewed:  Scheduled Meds:  (feeding supplement) PROSource Plus  30 mL Oral TID WC   acetaminophen  650 mg Oral Q6H   ascorbic acid  250 mg Oral BID   budesonide  9 mg Oral Daily   calcium acetate  667 mg Oral TID WC   Chlorhexidine Gluconate Cloth  6 each Topical Q0600   Chlorhexidine Gluconate Cloth  6 each Topical Q0600   collagenase   Topical Daily   darbepoetin (ARANESP) injection - DIALYSIS  200 mcg Subcutaneous Q Wed-1800   liver oil-zinc oxide   Topical TID   methocarbamol  500 mg Oral TID   metoprolol succinate  25 mg Oral Daily   multivitamin  1 tablet Oral QHS   nutrition supplement (JUVEN)  1 packet Oral BID BM   pantoprazole  40 mg Oral BID   polyethylene glycol  17 g Oral Daily   rosuvastatin  10 mg Oral Daily   sodium chloride flush  10-40 mL Intracatheter Q12H   vitamin A  10,000 Units Oral Daily   Continuous Infusions:  sodium chloride 0 mL/hr at 09/22/22 0757   DAPTOmycin (CUBICIN) 600 mg in sodium chloride 0.9 % IVPB 600 mg (10/13/22 1750)   DAPTOmycin (CUBICIN) 750 mg in sodium chloride 0.9 % IVPB 750 mg (10/10/22 1808)   sodium chloride      Pressure Injury 08/28/22 Sacrum Medial Unstageable -  Full thickness tissue loss in which the base of the injury is covered by slough (yellow, tan, gray, green or brown) and/or eschar (tan, brown or black) in the wound bed. Sacral wound with eschar coveri (Active)  08/28/22 2228  Location: Sacrum  Location Orientation: Medial  Staging: Unstageable - Full thickness tissue loss in which the base of the injury is covered by slough (yellow, tan, gray, green or brown) and/or eschar (tan, brown or black) in the wound bed.  Wound Description (Comments): Sacral wound with eschar covering wound bed  Present on Admission: Yes  Dressing Type Foam - Lift dressing to assess site every shift;Gauze (Comment);Moist to dry;Santyl;Barrier Film (skin prep) 10/13/22 1445     Pressure  Injury 09/04/22 Heel Left Deep Tissue Pressure Injury - Purple or maroon localized area of discolored intact skin or blood-filled blister due to damage of underlying soft tissue from pressure and/or shear. (Active)  09/04/22 1200  Location: Heel  Location Orientation: Left  Staging: Deep Tissue Pressure Injury - Purple or maroon localized area of discolored intact skin or blood-filled blister due to damage of underlying soft tissue from pressure and/or shear.  Wound Description (Comments):   Present on Admission:   Dressing Type Foam - Lift dressing to assess site every shift 10/12/22 2005     Pressure Injury 10/10/22 Thigh Posterior;Proximal;Right Stage 2 -  Partial thickness loss of dermis presenting as a shallow open injury with a red, pink wound bed without slough. painful, bleeding (Active)  10/10/22 0500  Location: Thigh  Location Orientation: Posterior;Proximal;Right  Staging: Stage 2 -  Partial thickness loss of dermis presenting as a shallow open injury with a red, pink wound bed without slough.  Wound Description (Comments): painful, bleeding  Present on Admission:   Dressing Type Foam - Lift dressing to assess site every shift 10/13/22 0734     Pressure Injury 10/10/22 Thigh Left;Posterior;Proximal Stage 2 -  Partial thickness loss of dermis presenting as a shallow open injury with a red, pink wound bed without slough. pink,skin pilled off (Active)  10/10/22 0500  Location: Thigh  Location Orientation: Left;Posterior;Proximal  Staging: Stage 2 -  Partial thickness loss of dermis presenting as a shallow open injury with a red, pink wound bed without slough.  Wound Description (Comments): pink,skin pilled off  Present on Admission:   Dressing Type Foam - Lift dressing to assess site every shift 10/13/22 0734   Diet Order             Diet regular Room service appropriate? Yes; Fluid consistency: Thin; Fluid restriction: 1200 mL Fluid  Diet effective now                    Intake/Output Summary (Last 24 hours) at 10/14/2022 1051 Last data filed at 10/13/2022 2057 Gross per 24 hour  Intake --  Output 1300 ml  Net -1300 ml   Net IO Since Admission: 3,436.53 mL [10/14/22 1051]  Wt Readings from Last 3 Encounters:  10/14/22 55 kg  08/23/22 64.1 kg  08/15/22 64.9 kg     Unresulted Labs (From admission, onward)     Start     Ordered   09/28/22 0500  CK  Every Monday,   R     Question:  Specimen collection method  Answer:  Lab=Lab collect   09/21/22 1130   Signed and Held  Renal function panel  Once,   R       Question:  Specimen collection method  Answer:  Unit=Unit collect   Signed and Held   Signed and Held  CBC  Once,   R       Question:  Specimen collection method  Answer:  Unit=Unit collect   Signed and Held          Data Reviewed: I have personally reviewed following labs and imaging studies CBC: Recent Labs  Lab 10/10/22 1741 10/11/22 0621 10/12/22 0605 10/13/22 0829 10/14/22 0112  WBC  --   --   --  13.8* 14.3*  HGB 8.4* 7.7* 7.6* 7.2* 8.0*  HCT 27.4* 25.1* 23.7* 23.6* 25.8*  MCV  --   --   --  101.3* 99.2  PLT  --   --   --  275 291   Basic Metabolic Panel: Recent Labs  Lab 10/08/22 0753 10/10/22 0020 10/11/22 0621 10/12/22 0605 10/13/22 0830 10/14/22 0112  NA 136 138 135 137 135 135  K 5.6* 4.4 4.3 4.8 5.6* 4.7  CL 96* 96* 94* 95* 95* 92*  CO2 22 26 26 25 24 27   GLUCOSE 98 63* 89 94 95 111*  BUN 94* 64* 57* 95* 129* 65*  CREATININE 5.99* 4.59* 4.39* 5.72* 7.23* 3.75*  CALCIUM 9.3 8.8* 8.5* 9.3 9.2 9.3  PHOS 4.7* 4.2 4.2 4.3 4.4  --    GFR: Estimated Creatinine Clearance: 14.7 mL/min (A) (by C-G formula based on SCr of 3.75 mg/dL (H)). Liver Function Tests: Recent Labs  Lab 10/10/22 0020 10/11/22 0621 10/12/22 0605 10/13/22 0830 10/14/22 0112  AST  --   --   --   --  18  ALT  --   --   --   --  16  ALKPHOS  --   --   --   --  111  BILITOT  --   --   --   --  1.1  PROT  --   --   --   --  5.5*  ALBUMIN  1.9* 1.8* 1.8* 1.8* 1.7*  No results found for this or any previous visit (from the past 240 hour(s)).  Antimicrobials: Anti-infectives (From admission, onward)    Start     Dose/Rate Route Frequency Ordered Stop   09/27/22 1200  DAPTOmycin (CUBICIN) 750 mg in sodium chloride 0.9 % IVPB  Status:  Discontinued        750 mg 130 mL/hr over 30 Minutes Intravenous Daily-1800 09/26/22 1733 09/27/22 0924   09/27/22 1200  DAPTOmycin (CUBICIN) 750 mg in sodium chloride 0.9 % IVPB        750 mg 130 mL/hr over 30 Minutes Intravenous  Once 09/27/22 0923 09/27/22 1500   09/26/22 1800  DAPTOmycin (CUBICIN) 750 mg in sodium chloride 0.9 % IVPB        10 mg/kg  75.2 kg 130 mL/hr over 30 Minutes Intravenous Every Sat (1800) 09/24/22 1342 10/23/22 2359   09/24/22 1800  DAPTOmycin (CUBICIN) 600 mg in sodium chloride 0.9 % IVPB        8 mg/kg  75.2 kg 124 mL/hr over 30 Minutes Intravenous Once per day on Tue Thu 09/24/22 1342 10/23/22 2359   09/19/22 1800  DAPTOmycin (CUBICIN) 750 mg in sodium chloride 0.9 % IVPB  Status:  Discontinued        10 mg/kg  75.2 kg 130 mL/hr over 30 Minutes Intravenous Every Sat (1800) 09/14/22 1622 09/24/22 1342   09/16/22 0830  DAPTOmycin (CUBICIN) 600 mg in sodium chloride 0.9 % IVPB  Status:  Discontinued  8 mg/kg  74.8 kg 124 mL/hr over 30 Minutes Intravenous  Once 09/16/22 0738 09/16/22 1555   09/15/22 1800  DAPTOmycin (CUBICIN) 600 mg in sodium chloride 0.9 % IVPB  Status:  Discontinued        8 mg/kg  75.2 kg 124 mL/hr over 30 Minutes Intravenous Once per day on Tue Thu 09/14/22 1622 09/24/22 1342   09/12/22 1800  DAPTOmycin (CUBICIN) 600 mg in sodium chloride 0.9 % IVPB        600 mg 124 mL/hr over 30 Minutes Intravenous  Once 09/12/22 1049 09/12/22 2033   09/11/22 1615  DAPTOmycin (CUBICIN) 600 mg in sodium chloride 0.9 % IVPB        600 mg 124 mL/hr over 30 Minutes Intravenous  Once 09/11/22 1522 09/11/22 2042   09/10/22 1200  vancomycin (VANCOREADY)  IVPB 500 mg/100 mL  Status:  Discontinued        500 mg 100 mL/hr over 60 Minutes Intravenous Every T-Th-Sa (Hemodialysis) 09/09/22 0719 09/10/22 0719   09/10/22 0815  vancomycin (VANCOREADY) IVPB 750 mg/150 mL        750 mg 150 mL/hr over 60 Minutes Intravenous  Once 09/10/22 0719 09/10/22 0939   09/10/22 0718  vancomycin variable dose per unstable renal function (pharmacist dosing)  Status:  Discontinued         Does not apply See admin instructions 09/10/22 0719 09/11/22 1522   09/09/22 2000  piperacillin-tazobactam (ZOSYN) IVPB 2.25 g  Status:  Discontinued        2.25 g 100 mL/hr over 30 Minutes Intravenous Every 8 hours 09/09/22 1849 09/10/22 1003   09/09/22 1145  ceFAZolin (ANCEF) 2-4 GM/100ML-% IVPB       Note to Pharmacy: Payton Emerald A: cabinet override      09/09/22 1145 09/09/22 2359   09/08/22 0830  vancomycin (VANCOREADY) IVPB 750 mg/150 mL        750 mg 150 mL/hr over 60 Minutes Intravenous  Once 09/08/22 0743 09/08/22 1004   09/07/22 1200  ceFAZolin (ANCEF) IVPB 2g/100 mL premix       Note to Pharmacy: Give in IR for surgical prophylaxis   2 g 200 mL/hr over 30 Minutes Intravenous  Once 09/07/22 1100 09/07/22 1327   09/07/22 1101  ceFAZolin (ANCEF) 2-4 GM/100ML-% IVPB       Note to Pharmacy: Phebe Colla N: cabinet override      09/07/22 1101 09/07/22 1335   09/07/22 0734  vancomycin variable dose per unstable renal function (pharmacist dosing)  Status:  Discontinued         Does not apply See admin instructions 09/07/22 0734 09/09/22 0718   09/05/22 1600  vancomycin (VANCOREADY) IVPB 750 mg/150 mL        750 mg 150 mL/hr over 60 Minutes Intravenous  Once 09/05/22 1505 09/05/22 1717   09/02/22 1227  ceFAZolin (ANCEF) 2-4 GM/100ML-% IVPB       Note to Pharmacy: Isabel Caprice, Destiny: cabinet override      09/02/22 1227 09/03/22 0044   09/02/22 0520  vancomycin variable dose per unstable renal function (pharmacist dosing)  Status:  Discontinued         Does not apply See  admin instructions 09/02/22 0521 09/05/22 0940   08/31/22 1800  vancomycin (VANCOREADY) IVPB 750 mg/150 mL  Status:  Discontinued        750 mg 150 mL/hr over 60 Minutes Intravenous Every 24 hours 08/31/22 0822 09/02/22 0521   08/30/22 1800  vancomycin (VANCOREADY) IVPB  1500 mg/300 mL        1,500 mg 150 mL/hr over 120 Minutes Intravenous STAT 08/30/22 1739 08/30/22 2139      Culture/Microbiology    Component Value Date/Time   SDES TISSUE 09/09/2022 1453   SPECREQUEST VESSEL PT ON VANC ANCEF 09/09/2022 1453   CULT  09/09/2022 1453    RARE STAPHYLOCOCCUS AUREUS SUSCEPTIBILITIES PERFORMED ON PREVIOUS CULTURE WITHIN THE LAST 5 DAYS. NO ANAEROBES ISOLATED Performed at [facility], [address], [place], [place] [address]    REPTSTATUS 09/14/2022 FINAL 09/09/2022 1453   Other culture-see note  Radiology Studies: No results found.   LOS: 47 days   Lanae Boast, MD [facility]  10/14/2022, 10:51 AM

## 2022-10-15 DIAGNOSIS — A419 Sepsis, unspecified organism: Secondary | ICD-10-CM | POA: Diagnosis not present

## 2022-10-15 DIAGNOSIS — R6521 Severe sepsis with septic shock: Secondary | ICD-10-CM | POA: Diagnosis not present

## 2022-10-15 LAB — BASIC METABOLIC PANEL
Anion gap: 13 (ref 5–15)
BUN: 106 mg/dL — ABNORMAL HIGH (ref 8–23)
CO2: 25 mmol/L (ref 22–32)
Calcium: 8.8 mg/dL — ABNORMAL LOW (ref 8.9–10.3)
Chloride: 96 mmol/L — ABNORMAL LOW (ref 98–111)
Creatinine, Ser: 5.34 mg/dL — ABNORMAL HIGH (ref 0.61–1.24)
GFR, Estimated: 11 mL/min — ABNORMAL LOW (ref >60)
Glucose, Bld: 119 mg/dL — ABNORMAL HIGH (ref 70–99)
Potassium: 4.7 mmol/L (ref 3.5–5.1)
Sodium: 134 mmol/L — ABNORMAL LOW (ref 135–145)

## 2022-10-15 LAB — CBC
HCT: 24.4 % — ABNORMAL LOW (ref 39.0–52.0)
Hemoglobin: 7.7 g/dL — ABNORMAL LOW (ref 13.0–17.0)
MCH: 31 pg (ref 26.0–34.0)
MCHC: 31.6 g/dL (ref 30.0–36.0)
MCV: 98.4 fL (ref 80.0–100.0)
Platelets: 273 10*3/uL (ref 150–400)
RBC: 2.48 MIL/uL — ABNORMAL LOW (ref 4.22–5.81)
RDW: 19.4 % — ABNORMAL HIGH (ref 11.5–15.5)
WBC: 14.5 10*3/uL — ABNORMAL HIGH (ref 4.0–10.5)
nRBC: 0 % (ref 0.0–0.2)

## 2022-10-15 LAB — HEMOGLOBIN AND HEMATOCRIT, BLOOD
HCT: 29.9 % — ABNORMAL LOW (ref 39.0–52.0)
Hemoglobin: 9.2 g/dL — ABNORMAL LOW (ref 13.0–17.0)

## 2022-10-15 MED ORDER — HEPARIN SODIUM (PORCINE) 1000 UNIT/ML IJ SOLN
INTRAMUSCULAR | Status: AC
  Administered 2022-10-15: 3200 [IU]
  Filled 2022-10-15: qty 4

## 2022-10-15 MED ORDER — PENTAFLUOROPROP-TETRAFLUOROETH EX AERO: 1.0000 | INHALATION_SPRAY | CUTANEOUS | Status: DC | PRN

## 2022-10-15 MED ORDER — ANTICOAGULANT SODIUM CITRATE 4% (200MG/5ML) IV SOLN: 5.0000 mL | Status: DC | PRN

## 2022-10-15 MED ORDER — HEPARIN SODIUM (PORCINE) 1000 UNIT/ML DIALYSIS: 1000.0000 [IU] | INTRAMUSCULAR | Status: DC | PRN

## 2022-10-15 MED ORDER — ALTEPLASE 2 MG IJ SOLR: 2.0000 mg | Freq: Once | INTRAMUSCULAR | Status: DC | PRN

## 2022-10-15 NOTE — Progress Notes (Signed)
Post HD TX NOTE  10/15/22 1237  Vitals  Temp 97.6 F (36.4 C)  Temp Source Oral  BP 125/77  MAP (mmHg) 93  BP Location Left Wrist  BP Method Automatic  Patient Position (if appropriate) Lying  Pulse Rate 85  Pulse Rate Source Monitor  ECG Heart Rate 93  Resp (!) 26  Oxygen Therapy  SpO2 99 %  O2 Device Room Air  Pulse Oximetry Type Continuous  During Treatment Monitoring  Intra-Hemodialysis Comments (S)   (post HD tx VS check)  Post Treatment  Dialyzer Clearance Lightly streaked  Duration of HD Treatment -hour(s) 3.5 hour(s)  Hemodialysis Intake (mL) 0 mL  Liters Processed 84  Fluid Removed (mL) 1500 mL  Tolerated HD Treatment Yes  Post-Hemodialysis Comments (S)  tx completed w/access issues in trying to get AVG to work. stuck x3, ven accessed but pull was slow. stuck art area x2 but got no blood return, used HD cath, MD made aware. Uf goa met, blood rinsed back, VSS: Medication Admin: Heparin Dwells 3200 units, Oxycontin 10mg  po  Hemodialysis Catheter Right Internal jugular Double lumen Permanent (Tunneled)  Placement Date/Time: 09/07/22 1352   Placed prior to admission: No  Serial / Lot #: [id number]  Expiration Date: 03/10/27  Time Out: Correct patient;Correct site;Correct procedure  Maximum sterile barrier precautions: Hand hygiene;Cap;Mask;Large steri...  Site Condition No complications  Blue Lumen Status Heparin locked;Dead end cap in place  Red Lumen Status Heparin locked;Dead end cap in place  Purple Lumen Status N/A  Catheter fill solution Heparin 1000 units/ml  Catheter fill volume (Arterial) 1.6 cc  Catheter fill volume (Venous) 1.6  Dressing Type Transparent  Dressing Status Antimicrobial disc in place;Clean, Dry, Intact  Drainage Description None  Dressing Change Due 10/22/22  Post treatment catheter status Capped and Clamped

## 2022-10-15 NOTE — Progress Notes (Signed)
PROGRESS NOTE Albert Harris  ZOX:[id number] DOB: 12-14-1955 DOA: 08/28/2022 PCP: [name], NP  Brief Narrative/Hospital Course: 50 yom w/ complicated medical history including ESRD on HD TTS, PVD,COPD, essential hypertension, chronic systolic congestive heart failure with ejection fraction 35 to 40%, presented with abdominal pain nausea and diaphoresis. Patient was also hypotensive with a lactic acidosis and was admitted to ICU,with a pulseless left lower extremity was found to have femoral pseudoaneurysm.Culture came back with MRSA bacteremia-TEE neg for vegetations.  Patient had prolonged and complicated hospitalization course seen by multiple consultant as below: 08/29/22: Admitted to ICU-on vasopressors for septic shock -s/p OR with Dr. Karin Lieu for left femoral exploration with pseudoaneurysm repair,hematoma evacuation>showed dehiscence at proximal aspect of bovine pericardial patch at the suture line.  -Emergent CRRT for severe hyperkalemia 09/08/22 Transferred to [facility] service  09/09/22 CTA for bleeding on left groin: showed Thrombosis of LEFT CFA pseudoaneurysm with new 3.5 cm filling defect, consistent with arterial thrombus, extending from the PSA neck and into the distal CFA. This is at risk for distal embolization and potential acute LEFT lower extremity ischemia.New 8 mm distal LEFT EIA pseudoaneurysm, medial to the stent distal landing zone. Mild interval enlargement of retroperitoneal hematoma, in a craniocaudal dimension measuring 12.2 cm (previously 11.2 cm).No CTA evidence of active extravasation. Patent RIGHT femoral-to-popliteal graft and bilateral iliac stents.Severe burden of splanchnic arterial atherosclerosis, at risk for chronic mesenteric ischemia. 09/09/22: urgent OR:S/P left common iliac to profunda femoris bypass with ipsilateral nonreversed greater saphenous vein in anatomic tunnel, left common iliac artery angioplasty and stenting, left greater saphenous vein harvest, left  sartorius muscle of left groin, left lower extremity angiography with first-order cannulation Transferred to 2H post op 09/12/22 am: 2 BRBPR> s/p Flex sig>showed 5 rectal ulcers, not actively bleeding-stool brown-advised to avoid constipation, added Miralax BID 09/13/22 WJ:XBJYN 2 large bloody BM ~ 500cc> got 1 unit PRBC. 4/11 repeat sigmoidoscopy-several bleeding ulcers, pathology came back positive for Crohn's and started on mesalamine, 4/18: massive rebleeding-not a candidate for IR due to severe PVD s/p emergent sigmoidoscopy with hemostatic gel applied to bleeding ulcer 4/20: s/p suturing of bleeding ulcers by Dr. Magnus Ivan in OR. Palliative care has been involved and has had multiple discussions between patient and family on goals-remains full code with full scope of care and signed off 5/8. 5/7: Patient having progressive ischemic changes to the toes no plan for intervention at this time is to improve clinically prior to elective AKA and continuing on wound care hydrotherapy.   Subjective: Seen and examined in dialysis unit  Resting comfortably no complaints  No pain on the sacrum or left foot  Overnight afebrile BP stable labs this morning shows WBC stable at 14.5 hemoglobin downtrending 7.7   Assessment and Plan: Active Problems:   Lower GI bleed   Acute blood loss anemia   ESRD (end stage renal disease) on dialysis (HCC)   Pseudoaneurysm of femoral artery (HCC)   Rectal arterial hemorrhage   Rectal ulcer   Hematochezia   MRSA bacteremia   Malnutrition of moderate degree   Decreased functional mobility   Pressure injury of skin   Colonic ulcer    Acute blood loss anemia recurrent hematochezia from anal/rectal ulcer- Crohns' disease: Patient had acute blood loss anemia with recurrent hematochezia from anal/rectal ulcer status post multiple sigmoidoscopy-normal bleeding after EGD and oversewing of ulcers and packing and will schedule with Gelfoam-see chain of events above.  GI  surgery signed off.  Plan is to continue budesonide  9 mg daily, discussed with GI lumbar 5/8,gi added Canasa PR x 14 doses.S/p multiple transfusion ~ 40 units, continue to monitor and transfuse for less than 7 g Recent Labs    10/11/22 0621 10/12/22 0605 10/13/22 0829 10/14/22 0112 10/15/22 0140  HGB 7.7* 7.6* 7.2* 8.0* 7.7*  MCV  --   --  101.3* 99.2 98.4     Severe PVD Infected left femoral patch S/p ileofemoral bypass w/ sartorius muscle flap 4/3 Retroperitoneal hemorrhage Ischemic toes: Complicated vascular/surgical history -S/p left groin hematoma evacuation, primary repair of the distal external iliac artery. Further complicated on 4/3 by left groin bleeding with new pseudoaneurysm and arterial thrombus. S/p iliofemoral bypass and removal of infected patch with sartorius muscle flap. Having ongoing progressive ischemic changes to the toes> no plan for intervention at this time, plan is to improve clinically prior to elective AKA per VVS.Has wound vac on left groin. See pic below  MRSA/Enterococcus faecium bacteremia secondary to postop wound infection: Status post removal of infected patch (see above). seen by ID>4/8 recommending daptomycin for 6 weeks  last day 10/23/22, will need oral suppression later- so need to contact ID at that time.TEE is negative for vegetations Monitor CPK weekly  Sacral pressure injury POA, unstageable/deep continue wound care, offloading bed mattress, position changing.  Continue hydrotherapy twice weekly per PT. continue to augment nutritional status for appropriate healing  ESRD on HD TTS, s/p short-term CRRT on 3/23 - 3/26, nephrology following, RIJ TC +.  AVG clotted 3/27 status post declot by VVS then clotted again> s/p IR TDC placed 4/1, having bruit in AVG 5/7-with plan to use AVG for for HD 5/9 Secondary hyperparathyroidism: Monitor calcium and Phos PTH.  Holding binders, on PhosLo per nephrology Anemia ESRD and acute blood loss anemia see  #1-continue Aranesp q Wednesday Recent Labs    10/05/22 0417 10/06/22 0913 10/07/22 0500 10/08/22 0753 10/10/22 0020 10/11/22 0621 10/12/22 0605 10/13/22 0830 10/14/22 0112 10/15/22 0140  BUN 70* 100* 56* 94* 64* 57* 95* 129* 65* 106*  CREATININE 5.26* 7.03* 4.42* 5.99* 4.59* 4.39* 5.72* 7.23* 3.75* 5.34*  CO2 26 23 24 22 26 26 25 24 27  25     Chronic systolic CHF EF 35-40% previously-TEE 4/1: EF 50-55%.  Severe LVH.  Address fluid with dialysis.Net IO Since Admission: 3,436.53 mL [10/15/22 1036]  Pulmonary hypertension-stable. Hypertension: BP well-controlled on Toprol 25 Hyperlipidemia: LDL at goal 45 continue Crestor 10 mg  Acute metabolic encephalopathy: In the setting of uremia metabolic acidosis.  Has had extensive workup including MRI finding as below.  At times became sedated with opiates/pain medication.Continue supportive care fall precaution PT OT as tolerated delirium precaution. Weaning opiates prn.  Incidental lacunar stroke:4 mm acute infarct in the right subinsular white matter on MRI 09/04/2022: Patient had altered mental status and left leg weakness-MRI ordered incidental lacunar infarct, seen by neurology completed stroke workup echo EF 45% carotid Doppler bilateral ICA 40-59%, LDL 45 at goal, HbA1c 5.1.  PTA on Plavix.  At this time unable to use antiplatelet due to recurrent bleeding.  COPD: Respiratory status stable  Pain management/uncontrolled back pain: Currently on tylenol 650 mg q6hr (since 4/19-monitor LFTs), on dilaudid 1 mg iv q4hr prn, oxy 5-10 mg q4hr/tramadol 50 mg q6hr. discussed extensively, decreased  iv Dilaudid to 0.5 mg 10/14/22.  Will continue to wean down his opiates as able.  Moderate malnutrition Hypoalbuminemia: RD following augment nutritional status, Nutrition Problem: Moderate Malnutrition (in the context of chronic illness (ESRD)) Etiology:  poor appetite Signs/Symptoms: moderate fat depletion, severe muscle depletion Interventions:  Refer to RD note for recommendations, Liberalize Diet, Nepro shake, MVI   Pressure injuries beside sacrum on left thigh, left heel DTI, right thigh see below-continue wound care Pressure Injury 10/10/22 Thigh Left;Posterior;Proximal Stage 2 -  Partial thickness loss of dermis presenting as a shallow open injury with a red, pink wound bed without slough. pink,skin pilled off  Pressure Injury 09/04/22 Heel Left Deep Tissue Pressure Injury - Purple or maroon localized area of discolored intact skin or blood-filled blister due to damage of underlying soft tissue from pressure and/or shear. Pressure Injury 10/10/22 Thigh Posterior;Proximal;Right Stage 2 -  Partial thickness loss of dermis presenting as a shallow open injury with a red, pink wound bed without slough. painful, bleeding (Active)  DVT prophylaxis: SCD's Start: 09/09/22 1841 Code Status:   Code Status: Full Code Family Communication: plan of care discussed with patient/daughter not at bedside. Patient status is:  inpatient  because of deep sacral ulcer, left ischemic toes Level of care: Progressive   Dispo: The patient is from: home            Anticipated disposition: TBD  Objective: Vitals last 24 hrs: Vitals:   10/15/22 0845 10/15/22 0900 10/15/22 0930 10/15/22 1000  BP: (!) 170/83 (!) 164/97 (!) 149/85 137/83  Pulse: 77 79 89 91  Resp: 20 20 (!) 31 17  Temp:      TempSrc:      SpO2: 99% 97% 100% 99%  Weight:      Height:       Weight change: -8.5 kg  Physical Examination: General exam: AAox3, ill-appearing,weak,older appearing HEENT:Oral mucosa moist, Ear/Nose WNL grossly, dentition normal. Respiratory system: bilaterally clear BS, no use of accessory muscle Cardiovascular system: S1 & S2 +, regular rate. Gastrointestinal system: Abdomen soft, left NT,ND,BS+ Nervous System:Alert, awake, moving extremities and grossly nonfocal Extremities: LE ankle edema neg, lower extremities warm Skin:No rashes,no  icterus. WUJ:WJXBJY muscle bulk,tone, power Left groin w/ wound vac Sacral decubitus dressing+  TDC+ on left chest Rt arm w/ AVG w.  Fluid thrill.     Medications reviewed:  Scheduled Meds:  (feeding supplement) PROSource Plus  30 mL Oral TID WC   acetaminophen  650 mg Oral Q6H   ascorbic acid  250 mg Oral BID   budesonide  9 mg Oral Daily   calcium acetate  667 mg Oral TID WC   Chlorhexidine Gluconate Cloth  6 each Topical Q0600   Chlorhexidine Gluconate Cloth  6 each Topical Q0600   collagenase   Topical Daily   darbepoetin (ARANESP) injection - DIALYSIS  200 mcg Subcutaneous Q Wed-1800   liver oil-zinc oxide   Topical TID   mesalamine  1,000 mg Rectal QHS   methocarbamol  500 mg Oral TID   metoprolol succinate  25 mg Oral Daily   multivitamin  1 tablet Oral QHS   nutrition supplement (JUVEN)  1 packet Oral BID BM   pantoprazole  40 mg Oral BID   polyethylene glycol  17 g Oral Daily   rosuvastatin  10 mg Oral Daily   sodium chloride flush  10-40 mL Intracatheter Q12H   vitamin A  10,000 Units Oral Daily   Continuous Infusions:  sodium chloride 0 mL/hr at 09/22/22 0757   anticoagulant sodium citrate     DAPTOmycin (CUBICIN) 600 mg in sodium chloride 0.9 % IVPB 600 mg (10/13/22 1750)   DAPTOmycin (CUBICIN) 750 mg in sodium chloride 0.9 %  IVPB 750 mg (10/10/22 1808)   sodium chloride     Diet Order             Diet regular Room service appropriate? Yes; Fluid consistency: Thin; Fluid restriction: 1200 mL Fluid  Diet effective now                  No intake or output data in the 24 hours ending 10/15/22 1036  Net IO Since Admission: 3,436.53 mL [10/15/22 1036]  Wt Readings from Last 3 Encounters:  10/15/22 73 kg  08/23/22 64.1 kg  08/15/22 64.9 kg     Unresulted Labs (From admission, onward)     Start     Ordered   10/15/22 0500  Basic metabolic panel  Daily,   R     Question:  Specimen collection method  Answer:  Unit=Unit collect   10/14/22 1054    10/15/22 0500  CBC  Daily,   R     Question:  Specimen collection method  Answer:  Unit=Unit collect   10/14/22 1054   09/28/22 0500  CK  Every Monday,   R     Question:  Specimen collection method  Answer:  Lab=Lab collect   09/21/22 1130          Data Reviewed: I have personally reviewed following labs and imaging studies CBC: Recent Labs  Lab 10/11/22 0621 10/12/22 0605 10/13/22 0829 10/14/22 0112 10/15/22 0140  WBC  --   --  13.8* 14.3* 14.5*  HGB 7.7* 7.6* 7.2* 8.0* 7.7*  HCT 25.1* 23.7* 23.6* 25.8* 24.4*  MCV  --   --  101.3* 99.2 98.4  PLT  --   --  275 291 273    Basic Metabolic Panel: Recent Labs  Lab 10/10/22 0020 10/11/22 0621 10/12/22 0605 10/13/22 0830 10/14/22 0112 10/15/22 0140  NA 138 135 137 135 135 134*  K 4.4 4.3 4.8 5.6* 4.7 4.7  CL 96* 94* 95* 95* 92* 96*  CO2 26 26 25 24 27 25   GLUCOSE 63* 89 94 95 111* 119*  BUN 64* 57* 95* 129* 65* 106*  CREATININE 4.59* 4.39* 5.72* 7.23* 3.75* 5.34*  CALCIUM 8.8* 8.5* 9.3 9.2 9.3 8.8*  PHOS 4.2 4.2 4.3 4.4  --   --     GFR: Estimated Creatinine Clearance: 12.8 mL/min (A) (by C-G formula based on SCr of 5.34 mg/dL (H)). Liver Function Tests: Recent Labs  Lab 10/10/22 0020 10/11/22 0621 10/12/22 0605 10/13/22 0830 10/14/22 0112  AST  --   --   --   --  18  ALT  --   --   --   --  16  ALKPHOS  --   --   --   --  111  BILITOT  --   --   --   --  1.1  PROT  --   --   --   --  5.5*  ALBUMIN 1.9* 1.8* 1.8* 1.8* 1.7*   No results found for this or any previous visit (from the past 240 hour(s)).  Antimicrobials: Anti-infectives (From admission, onward)    Start     Dose/Rate Route Frequency Ordered Stop   09/27/22 1200  DAPTOmycin (CUBICIN) 750 mg in sodium chloride 0.9 % IVPB  Status:  Discontinued        750 mg 130 mL/hr over 30 Minutes Intravenous Daily-1800 09/26/22 1733 09/27/22 0924   09/27/22 1200  DAPTOmycin (CUBICIN) 750 mg in sodium chloride 0.9 % IVPB  750 mg 130 mL/hr over 30  Minutes Intravenous  Once 09/27/22 0923 09/27/22 1500   09/26/22 1800  DAPTOmycin (CUBICIN) 750 mg in sodium chloride 0.9 % IVPB        10 mg/kg  75.2 kg 130 mL/hr over 30 Minutes Intravenous Every Sat (1800) 09/24/22 1342 10/23/22 2359   09/24/22 1800  DAPTOmycin (CUBICIN) 600 mg in sodium chloride 0.9 % IVPB        8 mg/kg  75.2 kg 124 mL/hr over 30 Minutes Intravenous Once per day on Tue Thu 09/24/22 1342 10/23/22 2359   09/19/22 1800  DAPTOmycin (CUBICIN) 750 mg in sodium chloride 0.9 % IVPB  Status:  Discontinued        10 mg/kg  75.2 kg 130 mL/hr over 30 Minutes Intravenous Every Sat (1800) 09/14/22 1622 09/24/22 1342   09/16/22 0830  DAPTOmycin (CUBICIN) 600 mg in sodium chloride 0.9 % IVPB  Status:  Discontinued        8 mg/kg  74.8 kg 124 mL/hr over 30 Minutes Intravenous  Once 09/16/22 0738 09/16/22 1555   09/15/22 1800  DAPTOmycin (CUBICIN) 600 mg in sodium chloride 0.9 % IVPB  Status:  Discontinued        8 mg/kg  75.2 kg 124 mL/hr over 30 Minutes Intravenous Once per day on Tue Thu 09/14/22 1622 09/24/22 1342   09/12/22 1800  DAPTOmycin (CUBICIN) 600 mg in sodium chloride 0.9 % IVPB        600 mg 124 mL/hr over 30 Minutes Intravenous  Once 09/12/22 1049 09/12/22 2033   09/11/22 1615  DAPTOmycin (CUBICIN) 600 mg in sodium chloride 0.9 % IVPB        600 mg 124 mL/hr over 30 Minutes Intravenous  Once 09/11/22 1522 09/11/22 2042   09/10/22 1200  vancomycin (VANCOREADY) IVPB 500 mg/100 mL  Status:  Discontinued        500 mg 100 mL/hr over 60 Minutes Intravenous Every T-Th-Sa (Hemodialysis) 09/09/22 0719 09/10/22 0719   09/10/22 0815  vancomycin (VANCOREADY) IVPB 750 mg/150 mL        750 mg 150 mL/hr over 60 Minutes Intravenous  Once 09/10/22 0719 09/10/22 0939   09/10/22 0718  vancomycin variable dose per unstable renal function (pharmacist dosing)  Status:  Discontinued         Does not apply See admin instructions 09/10/22 0719 09/11/22 1522   09/09/22 2000   piperacillin-tazobactam (ZOSYN) IVPB 2.25 g  Status:  Discontinued        2.25 g 100 mL/hr over 30 Minutes Intravenous Every 8 hours 09/09/22 1849 09/10/22 1003   09/09/22 1145  ceFAZolin (ANCEF) 2-4 GM/100ML-% IVPB       Note to Pharmacy: Payton Emerald A: cabinet override      09/09/22 1145 09/09/22 2359   09/08/22 0830  vancomycin (VANCOREADY) IVPB 750 mg/150 mL        750 mg 150 mL/hr over 60 Minutes Intravenous  Once 09/08/22 0743 09/08/22 1004   09/07/22 1200  ceFAZolin (ANCEF) IVPB 2g/100 mL premix       Note to Pharmacy: Give in IR for surgical prophylaxis   2 g 200 mL/hr over 30 Minutes Intravenous  Once 09/07/22 1100 09/07/22 1327   09/07/22 1101  ceFAZolin (ANCEF) 2-4 GM/100ML-% IVPB       Note to Pharmacy: Phebe Colla N: cabinet override      09/07/22 1101 09/07/22 1335   09/07/22 0734  vancomycin variable dose per unstable renal function (pharmacist dosing)  Status:  Discontinued         Does not apply See admin instructions 09/07/22 0734 09/09/22 0718   09/05/22 1600  vancomycin (VANCOREADY) IVPB 750 mg/150 mL        750 mg 150 mL/hr over 60 Minutes Intravenous  Once 09/05/22 1505 09/05/22 1717   09/02/22 1227  ceFAZolin (ANCEF) 2-4 GM/100ML-% IVPB       Note to Pharmacy: Isabel Caprice, Destiny: cabinet override      09/02/22 1227 09/03/22 0044   09/02/22 0520  vancomycin variable dose per unstable renal function (pharmacist dosing)  Status:  Discontinued         Does not apply See admin instructions 09/02/22 0521 09/05/22 0940   08/31/22 1800  vancomycin (VANCOREADY) IVPB 750 mg/150 mL  Status:  Discontinued        750 mg 150 mL/hr over 60 Minutes Intravenous Every 24 hours 08/31/22 0822 09/02/22 0521   08/30/22 1800  vancomycin (VANCOREADY) IVPB 1500 mg/300 mL        1,500 mg 150 mL/hr over 120 Minutes Intravenous STAT 08/30/22 1739 08/30/22 2139      Culture/Microbiology    Component Value Date/Time   SDES TISSUE 09/09/2022 1453   SPECREQUEST VESSEL PT ON VANC  ANCEF 09/09/2022 1453   CULT  09/09/2022 1453    RARE STAPHYLOCOCCUS AUREUS SUSCEPTIBILITIES PERFORMED ON PREVIOUS CULTURE WITHIN THE LAST 5 DAYS. NO ANAEROBES ISOLATED Performed at [facility], [address], [place], [place] [address]    REPTSTATUS 09/14/2022 FINAL 09/09/2022 1453   Other culture-see note  Radiology Studies: No results found.   LOS: 48 days   Lanae Boast, MD [facility]  10/15/2022, 10:36 AM

## 2022-10-15 NOTE — Consult Note (Signed)
WOC Nurse wound follow up; this RN made visit to change NPWT however night nurse said it was changed this morning at 2 a.m by bedside nurses; site assessed seal intact  Also received message from vascular regarding sacral wound  Wound type: Former Deep tissue Pressure Injury that has now evolved to Stage 4 Pressure Injury 11 cm x 14 cm x 3 cms 60% beefy red 20% yellow 20% black  2.  L ischium Deep Tissue Pressure Injury that has evolved into Unstageable Pressure Injury 5 cms x 2  cms 100% yellow  3. R ischium Deep Tissue Injury that has now evolved to Unstageable 3 cms x 3.5 cms 50% yellow 50% pink   Drainage (amount, consistency, odor) minimal serosanguinous from sacral wound and bilateral ischium  Periwound:  Dressing procedure/placement/frequency: 1.  Continue with Santyl daily dressing to sacral wound.  2.  Clean bilateral ischium with NS, apply  1/4" thick layer of Santyl to wound bed, top with saline moist gauze. Top with silicone foam.  Ok to lift foam daily for change of gauze and reapplication of Santyl. Ok to change silicone foam every 3 days.    Patient remains on low air loss mattress for pressure redistribution and moisture management.    WOC team will continue to follow patient for Negative Pressure Wound Therapy dressing changes qMonday/Thursday.   Thank you,    Priscella Mann MSN, RN-BC, [employer] [phone]

## 2022-10-15 NOTE — Progress Notes (Signed)
Physical Therapy Treatment Patient Details Name: Albert Harris MRN: [id number] DOB: Dec 30, 1955 Today's Date: 10/15/2022   History of Present Illness 67 year old male  ER with abdominal pain, nausea, diaphoresis and unable to feel his Lt foot. Hypotensive with lactic acidosis in ER.  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 CRRT 3/22-3/26.  3/22 repair of distal Lt external iliac artery. On 3/30, Hgb drop from 9 to 7 with worsening abdominal pain, CT abd 3/30 shows slightly larger anterior pelvic extraperitoneal hematoma. 4/18 -  massive hemoptysis requiring 4 units PRBC, bedside flex sig with bleeding ulcer, hemostatic gel applied, ETT 4/18-19. PMHx: PAD, ESRD HD, recently hospitalized 3/16-18 for acute on chronic left lower extremity ischemia.    PT Comments    Pt received in supine, reluctant to participate in therapy session after return ~2 hours prior from HD session and pt c/o increased pain/fatigue despite medication ~2 hours prior. Pt agreeable to repositioning for comfort and limited bed-level exercises. Upon repositioning, PTA observed some darker and some BRBPR and RN notified, defer further bed mobility or exercises due to pt lethargy/severe pain and amount of blood on his bed pad. Pt needing increased assist maxA for rolling/repositioning this date, not progressing toward goals. Pt continues to benefit from PT services to progress toward functional mobility goals, may need PT to reassess functional mobility goals next session as limited progress and apparent functional decline.     Recommendations for follow up therapy are one component of a multi-disciplinary discharge planning process, led by the attending physician.  Recommendations may be updated based on patient status, additional functional criteria and insurance authorization.  Follow Up Recommendations  Can patient physically be transported by private vehicle: No    Assistance Recommended at Discharge  Frequent or constant Supervision/Assistance  Patient can return home with the following Assist for transportation;Assistance with cooking/housework;Help with stairs or ramp for entrance;A lot of help with walking and/or transfers;A lot of help with bathing/dressing/bathroom   Equipment Recommendations  Other (comment) (TBD; currently would need mechanical lift, hospital bed with air mattress and reclining back wheelchair with roho cushion and elevating leg rests)    Recommendations for Other Services       Precautions / Restrictions Precautions Precautions: Fall Precaution Comments: L groin wound vac; Per verbal order of Dr. Chestine Spore and Dr. Lenell Antu 4/19 - unrestricted L hip ROM, pt appropriate to move OOB and mobilize as able. Restrictions Weight Bearing Restrictions: Yes LLE Weight Bearing: Weight bearing as tolerated     Mobility  Bed Mobility Overal bed mobility: Needs Assistance Bed Mobility: Rolling Rolling: Max assist         General bed mobility comments: Able to reach to opposite bedrail to pull self over w/ maxA to fully roll. increased back pain noted w/ this and pt needs maxA for LLE placement/support.    Transfers                   General transfer comment: defer for pt safety (apparent hematochezia and pain limiting him)    Ambulation/Gait                   Stairs             Wheelchair Mobility    Modified Rankin (Stroke Patients Only)       Balance       Sitting balance - Comments: defer, pt pain/fatigue limited  Cognition Arousal/Alertness: Awake/alert Behavior During Therapy: WFL for tasks assessed/performed Overall Cognitive Status: Within Functional Limits for tasks assessed                                 General Comments: Pt initially declining participation due to recently finishing HD and having increased pain, but agreeable to repositioning for comfort  and some bed mobility/exercises. Once in progress of repositioning, PTA noticed some darker and some brigher appearing BRBPR so RN notified and defer further exercises/mobility due to need for hygiene assist and RN assessment. Daughter in room and reports no recent blood in stool for past couple weeks as far as she knows.        Exercises Other Exercises Other Exercises: cues for LLE A/AAROM but pt mostly needing PROM for LLE hip flexion and SAQ, after 1-2 reps pt states pain is too severe.    General Comments General comments (skin integrity, edema, etc.): RN notified of apparent hematochezia and pt needing clean-up from stool on bed pad, defer removing this/cleaning him up due to need for RN to assess his wound/bottom area. Pt states pain is intolerable and wasn't able to participate well in LE exercises due to pain/fatigue      Pertinent Vitals/Pain Pain Assessment Pain Assessment: Faces Faces Pain Scale: Hurts whole lot Breathing: occasional labored breathing, short period of hyperventilation Negative Vocalization: repeated troubled calling out, loud moaning/groaning, crying Facial Expression: facial grimacing Body Language: tense, distressed pacing, fidgeting Consolability: distracted or reassured by voice/touch PAINAD Score: 7 Pain Location: back/bottom and LLE at rest and with repositioning Pain Descriptors / Indicators: Grimacing, Guarding, Sore, Constant, Discomfort, Burning, Moaning Pain Intervention(s): Limited activity within patient's tolerance, Monitored during session, Repositioned, Patient requesting pain meds-RN notified, Other (comment) (positioned in semi-sidelying toward his R side)    Home Living                          Prior Function            PT Goals (current goals can now be found in the care plan section) Acute Rehab PT Goals Patient Stated Goal: Less pain so I can get better and go home. PT Goal Formulation: With patient/family Time For Goal  Achievement: 10/19/22 (PT update his goals on 4/29, updated date per protocol) Progress towards PT goals: Not progressing toward goals - comment    Frequency    Min 2X/week      PT Plan Current plan remains appropriate (would benefit from further palliative work-up but apparently pt has not been amenable)    Co-evaluation              AM-PAC PT "6 Clicks" Mobility   Outcome Measure  Help needed turning from your back to your side while in a flat bed without using bedrails?: A Lot Help needed moving from lying on your back to sitting on the side of a flat bed without using bedrails?: Total Help needed moving to and from a bed to a chair (including a wheelchair)?: Total Help needed standing up from a chair using your arms (e.g., wheelchair or bedside chair)?: Total Help needed to walk in hospital room?: Total Help needed climbing 3-5 steps with a railing? : Total 6 Click Score: 7    End of Session Equipment Utilized During Treatment: Other (comment) (bed pads) Activity Tolerance: Patient limited by pain;Patient limited by lethargy;Other (comment);Treatment  limited secondary to medical complications (Comment) (apparent blood in his stool) Patient left: in bed;with call bell/phone within reach;with family/visitor present;Other (comment) (semi-sidelying toward her R side with pillows under bed pad to offload pain in bottom, and under LLE between his legs) Nurse Communication: Mobility status;Other (comment) (hematochezia, needs wound/hygiene assist) PT Visit Diagnosis: Other abnormalities of gait and mobility (R26.89);Muscle weakness (generalized) (M62.81);Difficulty in walking, not elsewhere classified (R26.2);Pain     Time: 1450-1505 PT Time Calculation (min) (ACUTE ONLY): 15 min  Charges:  $Therapeutic Activity: 8-22 mins                     Shashwat Cleary P., PTA Acute Rehabilitation Services Secure Chat Preferred 9a-5:30pm Office: [phone]    Dorathy Kinsman [facility] 10/15/2022,  4:19 PM

## 2022-10-15 NOTE — Progress Notes (Signed)
Received patient in bed to unit.  Alert and oriented.  Informed consent signed and in chart.   TX duration:4  Patient tolerated well.  Transported back to the room  Alert, without acute distress.  Hand-off given to patient's nurse.   Access used: catheter Access issues: n/a    Total UF removed: 1.5L Post HD weight:  72.5 KG   10/15/22 1210  Vitals  Temp 97.6 F (36.4 C)  Temp Source Oral  BP 107/89  MAP (mmHg) 97  Pulse Rate 97  ECG Heart Rate 96  Resp 20  Oxygen Therapy  SpO2 99 %  O2 Device Room Air  During Treatment Monitoring  Intra-Hemodialysis Comments Tx completed  Post Treatment  Dialyzer Clearance Lightly streaked  Duration of HD Treatment -hour(s) 3.3 hour(s)  Hemodialysis Intake (mL) 0 mL  Liters Processed 84  Fluid Removed (mL) 1500 mL  Tolerated HD Treatment Yes  Hemodialysis Catheter Right Internal jugular Double lumen Permanent (Tunneled)  Placement Date/Time: 09/07/22 1352   Placed prior to admission: No  Serial / Lot #: [id number]  Expiration Date: 03/10/27  Time Out: Correct patient;Correct site;Correct procedure  Maximum sterile barrier precautions: Hand hygiene;Cap;Mask;Large steri...  Site Condition No complications  Blue Lumen Status Heparin locked  Red Lumen Status Heparin locked  Catheter fill volume (Arterial) 1.6 cc  Catheter fill volume (Venous) 1.6  Dressing Type Transparent  Dressing Status Antimicrobial disc in place  Dressing Change Due 10/22/22  Post treatment catheter status Capped and Clamped          [facility]

## 2022-10-15 NOTE — Progress Notes (Signed)
[facility] Progress Note   Dialysis Orders: OP HD: TTS SW  4h   350/800  64.5kg  new TDC (RFA AVG clotted)  Heparin none  - last OP HD 3/14, post wt 65.9kg - venofer 50mg  IV weekly - rocaltrol 0.25 mcg po tiw - phoslo 2 ac tid - no esa, last Hb 11.0  Assessment/Plan: Recurrent lower GIB: S/p colonoscopy 09/15/22, then repeat sigmoidoscopy 4/11. Several bleeding ulcers addressed.  Path came back + Crohn's - started on mesalamine by GI. Brisk re-bleed 4/18, took again for emergent sigmoidoscopy with hemostatic gel applied to bleeding ulcer. Then bled again from his rectum/ anus requiring suturing of bleeding ulcers 4/20 by Dr. Magnus Ivan. Hb stable at 8.0 today.  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 SP iliofemoral bypass, removal of infected patch with sartorius muscle flap 09/09/22. Ischemic changes to L 3rd-5th toes persist. 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 Neuro signed off.  ESRD: sp short-term CRRT 3/23- 3/26, now back on iHD on TTS schedule.   Seen on HD 2K bath RIJ TC 137/89 1.5L net UF  He also has a right FAL AVG A5 configuration with running blood; we tried cannulating 3x today and successful only once. Will need to study as outpatient; would not recommend cannulating again during hospitalization.   BP/volume: clear lungs, no edema on exam. Bed weights are likely inaccurate Anemia (ESRD + ABLA): Last transfused 1U on 4/25 (total of 25U this admit). Also on Aranesp q Wed (max dose). Hb 7- 8 range.  Secondary HPTH: CorrCa high, phos at goal. Holding VDRA, decreased phoslo dose Dialysis access: AVG clotted 3/27, went for declot by VVS and then re-clotted. VVS placed R TDC on 4/1 but there is a strong bruit in the AVG 5/7 (will try to use on  Thur). Unable to cannulate on 5/9. Use catheter for now. Hypoalbuminemia - Alb 1.7, in hospital > 30 days. Palliative care has revisited, see their notes. Pt has not been responsive to suggestions like DNR, etc. Remains full code.    Subjective:   Reports his feet are hurting, also had wound care so sacral area is hurting. Denies SOB, CP, dizziness, nausea. Reports poor appetite.   Objective Vitals:   10/15/22 0821 10/15/22 0845 10/15/22 0900 10/15/22 0930  BP: (!) 155/81 (!) 170/83 (!) 164/97 (!) 149/85  Pulse: 79 77 79 89  Resp: (!) 21 20 20  (!) 31  Temp:      TempSrc:      SpO2: 96% 99% 97% 100%  Weight:      Height:       Physical Exam General: Alert, frail appearing male in NAD Heart: RRR, no murmurs, rubs or gallops Lungs: CTA bilaterally  Abdomen: Soft, non-distended, +BS Extremities: Ischemic toes on left foot. No edema b/l lower extremities Dialysis Access: [facility], AVG +bruit  Additional Objective Labs: Basic Metabolic Panel: Recent Labs  Lab 10/11/22 0621 10/12/22 0605 10/13/22 0830 10/14/22 0112 10/15/22 0140  NA 135 137 135 135 134*  K 4.3 4.8 5.6* 4.7 4.7  CL 94* 95* 95* 92* 96*  CO2 26 25 24 27 25   GLUCOSE 89 94 95 111* 119*  BUN 57* 95* 129* 65* 106*  CREATININE 4.39* 5.72* 7.23* 3.75* 5.34*  CALCIUM 8.5* 9.3 9.2 9.3 8.8*  PHOS 4.2 4.3 4.4  --   --    Liver Function Tests: Recent Labs  Lab 10/12/22 0605 10/13/22 0830 10/14/22 0112  AST  --   --  18  ALT  --   --  16  ALKPHOS  --   --  111  BILITOT  --   --  1.1  PROT  --   --  5.5*  ALBUMIN 1.8* 1.8* 1.7*   No results for input(s): "LIPASE", "AMYLASE" in the last 168 hours. CBC: Recent Labs  Lab 10/13/22 0829 10/14/22 0112 10/15/22 0140  WBC 13.8* 14.3* 14.5*  HGB 7.2* 8.0* 7.7*  HCT 23.6* 25.8* 24.4*  MCV 101.3* 99.2 98.4  PLT 275 291 273   Blood Culture    Component Value Date/Time   SDES TISSUE 09/09/2022 1453   SPECREQUEST VESSEL PT ON VANC ANCEF 09/09/2022 1453   CULT   09/09/2022 1453    RARE STAPHYLOCOCCUS AUREUS SUSCEPTIBILITIES PERFORMED ON PREVIOUS CULTURE WITHIN THE LAST 5 DAYS. NO ANAEROBES ISOLATED Performed at [facility], [address], [place], [place] [address]    REPTSTATUS 09/14/2022 FINAL 09/09/2022 1453    Cardiac Enzymes: Recent Labs  Lab 10/12/22 0605  CKTOTAL 20*   CBG: No results for input(s): "GLUCAP" in the last 168 hours. Iron Studies: No results for input(s): "IRON", "TIBC", "TRANSFERRIN", "FERRITIN" in the last 72 hours. @lablastinr3 @ Studies/Results: No results found. Medications:  sodium chloride 0 mL/hr at 09/22/22 0757   anticoagulant sodium citrate     DAPTOmycin (CUBICIN) 600 mg in sodium chloride 0.9 % IVPB 600 mg (10/13/22 1750)   DAPTOmycin (CUBICIN) 750 mg in sodium chloride 0.9 % IVPB 750 mg (10/10/22 1808)   sodium chloride      (feeding supplement) PROSource Plus  30 mL Oral TID WC   acetaminophen  650 mg Oral Q6H   ascorbic acid  250 mg Oral BID   budesonide  9 mg Oral Daily   calcium acetate  667 mg Oral TID WC   Chlorhexidine Gluconate Cloth  6 each Topical Q0600   Chlorhexidine Gluconate Cloth  6 each Topical Q0600   collagenase   Topical Daily   darbepoetin (ARANESP) injection - DIALYSIS  200 mcg Subcutaneous Q Wed-1800   liver oil-zinc oxide   Topical TID   mesalamine  1,000 mg Rectal QHS   methocarbamol  500 mg Oral TID   metoprolol succinate  25 mg Oral Daily   multivitamin  1 tablet Oral QHS   nutrition supplement (JUVEN)  1 packet Oral BID BM   pantoprazole  40 mg Oral BID   polyethylene glycol  17 g Oral Daily   rosuvastatin  10 mg Oral Daily   sodium chloride flush  10-40 mL Intracatheter Q12H   vitamin A  10,000 Units Oral Daily

## 2022-10-15 NOTE — Progress Notes (Signed)
Informed by PT that Pt appeared to have bloody BM in bed.  Small amount of stool and some dark blood found.  While cleaning him up he passed additional stool that appeared normal and was followed by a small amount of bright red blood.  Soiled dressings were changed at this time. MD made aware.  Will cont to monitor.

## 2022-10-16 DIAGNOSIS — A419 Sepsis, unspecified organism: Secondary | ICD-10-CM | POA: Diagnosis not present

## 2022-10-16 DIAGNOSIS — R6521 Severe sepsis with septic shock: Secondary | ICD-10-CM | POA: Diagnosis not present

## 2022-10-16 LAB — CBC
HCT: 28.2 % — ABNORMAL LOW (ref 39.0–52.0)
Hemoglobin: 8.8 g/dL — ABNORMAL LOW (ref 13.0–17.0)
MCH: 30.9 pg (ref 26.0–34.0)
MCHC: 31.2 g/dL (ref 30.0–36.0)
MCV: 98.9 fL (ref 80.0–100.0)
Platelets: 255 10*3/uL (ref 150–400)
RBC: 2.85 MIL/uL — ABNORMAL LOW (ref 4.22–5.81)
RDW: 19.4 % — ABNORMAL HIGH (ref 11.5–15.5)
WBC: 14 10*3/uL — ABNORMAL HIGH (ref 4.0–10.5)
nRBC: 0 % (ref 0.0–0.2)

## 2022-10-16 LAB — BASIC METABOLIC PANEL
Anion gap: 14 (ref 5–15)
BUN: 46 mg/dL — ABNORMAL HIGH (ref 8–23)
CO2: 25 mmol/L (ref 22–32)
Calcium: 8.1 mg/dL — ABNORMAL LOW (ref 8.9–10.3)
Chloride: 95 mmol/L — ABNORMAL LOW (ref 98–111)
Creatinine, Ser: 3.06 mg/dL — ABNORMAL HIGH (ref 0.61–1.24)
GFR, Estimated: 21 mL/min — ABNORMAL LOW (ref >60)
Glucose, Bld: 84 mg/dL (ref 70–99)
Potassium: 4 mmol/L (ref 3.5–5.1)
Sodium: 134 mmol/L — ABNORMAL LOW (ref 135–145)

## 2022-10-16 MED ORDER — OXYCODONE HCL 5 MG PO TABS
2.5000 mg | ORAL_TABLET | Freq: Four times a day (QID) | ORAL | Status: DC | PRN
  Administered 2022-10-17 – 2022-10-18 (×4): 2.5 mg via ORAL
  Filled 2022-10-16 (×4): qty 1

## 2022-10-16 NOTE — Progress Notes (Signed)
Palliative Medicine Progress Note   Patient Name: Albert Harris       Date: 10/16/2022 DOB: [birth year]  Age: 67 y.o. MRN#: [id number] Attending Physician: Lanae Boast, MD Primary Care Physician: [name], NP Admit Date: 08/28/2022  Reason for Consultation/Follow-up: {Reason for Consult:23484}  HPI/Patient Profile: 67 y.o. male  with past medical history of ESRD on HD, peripheral vascular disease, chronic systolic CHF with EF 35 to 40%, and COPD.  He presented to [facility] on 08/28/2022 with abdominal pain, nausea, and unable to feel his left foot.  He was found to have a left femoral pseudoaneurysm and was taken to the OR for repair of his distal left external iliac artery.  He required emergent CRRT due to hypotension and hyperkalemia.  Hospitalization has been complicated by MRSA bacteremia secondary to post-op wound infection and recurrent rectal bleeding requiring multiple transfusions.    Palliative Medicine was consulted for goals of care in the setting of ESRD, severe vascular disease, and bleeding.  Subjective: Today I met at bedside with patient, daughter/Albert Harris, and wife/Albert Harris (on speaker phone)  Objective:  Physical Exam          Vital Signs: BP 109/66 (BP Location: Left Wrist)   Pulse 85   Temp 98.2 F (36.8 C) (Axillary)   Resp 15   Ht 5' 7.99" (1.727 m)   Wt 62.5 kg   SpO2 98%   BMI 20.96 kg/m  SpO2: SpO2: 98 % O2 Device: O2 Device: Room Air O2 Flow Rate: O2 Flow Rate (L/min): 98 L/min  Intake/output summary:  Intake/Output Summary (Last 24 hours) at 10/16/2022 1412 Last data filed at 10/15/2022 2200 Gross per 24 hour  Intake 200 ml  Output --  Net 200 ml    LBM: Last BM Date : 10/15/22     Palliative Assessment/Data: ***     Palliative Medicine  Assessment & Plan   Assessment: Active Problems:   Lower GI bleed   ESRD (end stage renal disease) on dialysis (HCC)   Malnutrition of moderate degree   Pseudoaneurysm of femoral artery (HCC)   Decreased functional mobility   Pressure injury of skin   Colonic ulcer   Rectal arterial hemorrhage   Rectal ulcer   Hematochezia   Acute blood loss anemia   MRSA bacteremia    Recommendations/Plan: Code status  changed to DNR/DNI  Goals of Care and Additional Recommendations: Limitations on Scope of Treatment: {Recommended Scope and Preferences:21019}  Code Status:   Prognosis:  {Palliative Care Prognosis:23504}  Discharge Planning: {Palliative dispostion:23505}  Care plan was discussed with ***  Thank you for allowing the Palliative Medicine Team to assist in the care of this patient.   ***   Merry Proud, NP   Please contact Palliative Medicine Team phone at [phone] for questions and concerns.  For individual providers, please see AMION.

## 2022-10-16 NOTE — Progress Notes (Signed)
Physical Therapy Wound Treatment Patient Details  Name: Albert Harris MRN: [id number] Date of Birth: 1955-09-17  Today's Date: 10/16/2022 Time: 1001-1054 Time Calculation (min): 53 min  Subjective  Subjective Assessment Subjective: Pt pleasant and agreeable to wound therapy Patient and Family Stated Goals: Heal wound Date of Onset:  (unknown) Prior Treatments: Dressing changes  Pain Score:  Pt premedicated with IV dilaudid. Significant LE pain with any mobility.   Wound Assessment  Pressure Injury 08/28/22 Sacrum Medial Unstageable - Full thickness tissue loss in which the base of the injury is covered by slough (yellow, tan, gray, green or brown) and/or eschar (tan, brown or black) in the wound bed. Sacral wound with eschar coveri (Active)  Dressing Type Foam - Lift dressing to assess site every shift;Gauze (Comment);Moist to dry;Barrier Film (skin prep) 10/16/22 1110  Dressing Changed 10/16/22 1110  Dressing Change Frequency Daily 10/16/22 1110  State of Healing Eschar 10/16/22 1110  Site / Wound Assessment Pink;Painful;Black 10/16/22 1110  % Wound base Red or Granulating 65% 10/16/22 1110  % Wound base Yellow/Fibrinous Exudate 15% 10/16/22 1110  % Wound base Black/Eschar 20% 10/16/22 1110  % Wound base Other/Granulation Tissue (Comment) 30% 10/14/22 1445  Peri-wound Assessment Pink;Purple;Intact 10/16/22 1110  Wound Length (cm) 14 cm 10/13/22 1445  Wound Width (cm) 9.8 cm 10/13/22 1445  Wound Depth (cm) 4 cm 10/13/22 1445  Wound Surface Area (cm^2) 137.2 cm^2 10/13/22 1445  Wound Volume (cm^3) 548.8 cm^3 10/13/22 1445  Tunneling (cm) 0 09/29/22 1327  Undermining (cm) at 3:00 4.0 cm 10/13/22 1445  Margins Unattached edges (unapproximated) 10/16/22 1110  Drainage Amount Copious 10/13/22 1445  Drainage Description Serosanguineous 10/13/22 1445  Treatment Debridement (Selective);Irrigation;Packing (Saline gauze) 10/13/22 1445      Selective Debridement  (non-excisional) Selective Debridement (non-excisional) - Location: Sacrum Selective Debridement (non-excisional) - Tools Used: Forceps, Scissors Selective Debridement (non-excisional) - Tissue Removed: yellow and black nonviable tissue    Wound Assessment and Plan  Wound Therapy - Assess/Plan/Recommendations Wound Therapy - Clinical Statement: Pt with large bloody stool on arrival. Cleaned up and then with another smaller bloody stool. Continue hydrotherapy to remove necrotic tissue and promote healing. Santyl not present and waiting for it to come up from pharmacy. Nurse will apply at next dressing change. Wound Therapy - Functional Problem List: Decreased tolerance for repositioning and OOB due to wound; global weakness in the setting of extended hospitalization and now ICU stay. Factors Delaying/Impairing Wound Healing: Immobility, Multiple medical problems, Polypharmacy Hydrotherapy Plan: Debridement, Dressing change, Patient/family education Wound Therapy - Frequency: 2X / week Wound Therapy - Follow Up Recommendations: dressing changes by RN  Wound Therapy Goals- Improve the function of patient's integumentary system by progressing the wound(s) through the phases of wound healing (inflammation - proliferation - remodeling) by: Wound Therapy Goals - Improve the function of patient's integumentary system by progressing the wound(s) through the phases of wound healing by: Decrease Necrotic Tissue to: 20% Decrease Necrotic Tissue - Progress: Progressing toward goal Increase Granulation Tissue to: 80% Increase Granulation Tissue - Progress: Progressing toward goal  Goals will be updated until maximal potential achieved or discharge criteria met.  Discharge criteria: when goals achieved, discharge from hospital, MD decision/surgical intervention, no progress towards goals, refusal/missing three consecutive treatments without notification or medical reason.  GP     Charges PT Wound Care  Charges $Wound Debridement up to 20 cm: < or equal to 20 cm $ Wound Debridement each add'l 20 sqcm: 2 $PT Hydrotherapy Dressing: 1 dressing $PT  Hydrotherapy Visit: 1 Visit       [name] [facility] 10/16/2022, 11:18 AM [name] PT Acute Rehabilitation Services Office [phone]

## 2022-10-16 NOTE — Progress Notes (Signed)
[facility] Progress Note   Subjective:  Seen in room - says he is in pain from sacrum and L foot. Dialyzed yesterday - issues with AVG, ended up using his [facility] which did ok.   Objective Vitals:   10/16/22 0300 10/16/22 0402 10/16/22 0800 10/16/22 1112  BP:  133/87 (!) 157/86 99/70  Pulse: 90 93 88 91  Resp:  20 15 12   Temp:  98.3 F (36.8 C) 98.2 F (36.8 C) 97.8 F (36.6 C)  TempSrc:  Oral Oral Oral  SpO2:  94% 97% 98%  Weight:  62.5 kg    Height:       Physical Exam General: Frail man, lying in bed. Room air. Heart: RRR; no murmur Lungs: CTA anteriorly Abdomen: soft Extremities: No LE edema; ischemic L toes Dialysis Access: TDC, L forearm AVG + bruit  Additional Objective Labs: Basic Metabolic Panel: Recent Labs  Lab 10/11/22 0621 10/12/22 0605 10/13/22 0830 10/14/22 0112 10/15/22 0140 10/16/22 0119  NA 135 137 135 135 134* 134*  K 4.3 4.8 5.6* 4.7 4.7 4.0  CL 94* 95* 95* 92* 96* 95*  CO2 26 25 24 27 25 25   GLUCOSE 89 94 95 111* 119* 84  BUN 57* 95* 129* 65* 106* 46*  CREATININE 4.39* 5.72* 7.23* 3.75* 5.34* 3.06*  CALCIUM 8.5* 9.3 9.2 9.3 8.8* 8.1*  PHOS 4.2 4.3 4.4  --   --   --    Liver Function Tests: Recent Labs  Lab 10/12/22 0605 10/13/22 0830 10/14/22 0112  AST  --   --  18  ALT  --   --  16  ALKPHOS  --   --  111  BILITOT  --   --  1.1  PROT  --   --  5.5*  ALBUMIN 1.8* 1.8* 1.7*   CBC: Recent Labs  Lab 10/13/22 0829 10/14/22 0112 10/15/22 0140 10/15/22 2252 10/16/22 0849  WBC 13.8* 14.3* 14.5*  --  14.0*  HGB 7.2* 8.0* 7.7* 9.2* 8.8*  HCT 23.6* 25.8* 24.4* 29.9* 28.2*  MCV 101.3* 99.2 98.4  --  98.9  PLT 275 291 273  --  255   Medications:  sodium chloride 0 mL/hr at 09/22/22 0757   DAPTOmycin (CUBICIN) 600 mg in sodium chloride 0.9 % IVPB 600 mg (10/15/22 1633)   DAPTOmycin (CUBICIN) 750 mg in sodium chloride 0.9 % IVPB 750 mg (10/10/22 1808)   sodium chloride      (feeding supplement) PROSource Plus  30 mL  Oral TID WC   acetaminophen  650 mg Oral Q6H   ascorbic acid  250 mg Oral BID   budesonide  9 mg Oral Daily   calcium acetate  667 mg Oral TID WC   Chlorhexidine Gluconate Cloth  6 each Topical Q0600   Chlorhexidine Gluconate Cloth  6 each Topical Q0600   collagenase   Topical Daily   darbepoetin (ARANESP) injection - DIALYSIS  200 mcg Subcutaneous Q Wed-1800   liver oil-zinc oxide   Topical TID   mesalamine  1,000 mg Rectal QHS   methocarbamol  500 mg Oral TID   metoprolol succinate  25 mg Oral Daily   multivitamin  1 tablet Oral QHS   nutrition supplement (JUVEN)  1 packet Oral BID BM   pantoprazole  40 mg Oral BID   polyethylene glycol  17 g Oral Daily   rosuvastatin  10 mg Oral Daily   sodium chloride flush  10-40 mL Intracatheter Q12H   vitamin A  10,000 Units Oral Daily    Dialysis Orders: TTS SW  4h   350/800  64.5kg  new TDC (RFA AVG clotted)  Heparin none  - last OP HD 3/14, post wt 65.9kg - venofer 50mg  IV weekly - rocaltrol 0.25 mcg po tiw - phoslo 2 ac tid - no esa, last Hb 11.0   Assessment/Plan: Recurrent lower GIB: S/p colonoscopy 09/15/22, then repeat sigmoidoscopy 4/11. Several bleeding ulcers addressed.  Path came back + Crohn's - started on mesalamine by GI. Brisk re-bleed 4/18, took again for emergent sigmoidoscopy with hemostatic gel applied to bleeding ulcer. Then bled again requiring suturing of bleeding ulcers 4/20 by Dr. Magnus Ivan. Hgb 8.8 today - stable. 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 S/p iliofemoral bypass, removal of infected patch with sartorius muscle flap 09/09/22. Ischemic changes to L 3rd-5th toes persist. 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 CVA on MRI. Neuro signed off.  ESRD: S/p short-term CRRT 3/23- 3/26, now back on iHD on TTS schedule.  Next HD tomorrow using TDC.  BP/volume: No LE edema, below prior EDW. Anemia (ESRD + ABLA): Last transfused 1U on 4/25 (total of 25U this admit). Also on Aranesp q Wed (max dose). Hgb now 8 range.  Secondary HPTH: CorrCa high, phos at goal. Holding VDRA, decreased phoslo dose Dialysis access: AVG clotted 3/27, went for declot by VVS and then re-clotted. VVS placed R TDC on 4/1 but there is a strong bruit in the AVG 5/7 - attempted and unable to cannulate on 5/9. Use catheter for now. Hypoalbuminemia - Alb 1.7, in hospital > 30 days. Palliative care has revisited, see their notes. Pt has not been responsive to suggestions like DNR, etc. Remains full code.     Ozzie Hoyle, PA-C 10/16/2022, 12:03 PM  [employer]

## 2022-10-16 NOTE — Progress Notes (Signed)
Noted MD referral regarding possible LTACH- CM reached out to both [facility] and [facility] liaisons- currently there are no HD beds available at either facility- CM will f/u next week.

## 2022-10-16 NOTE — Care Management Important Message (Signed)
Important Message  Patient Details  Name: Albert Harris MRN: [id number] Date of Birth: 24-Aug-1955   Medicare Important Message Given:  Yes     Renie Ora 10/16/2022, 10:30 AM

## 2022-10-16 NOTE — Progress Notes (Addendum)
PROGRESS NOTE Albert Harris  WUJ:[id number] DOB: [birth year] DOA: 08/28/2022 PCP: [name], NP  Brief Narrative/Hospital Course: 35 yom w/ complicated medical history including ESRD on HD TTS, PVD,COPD, essential hypertension, chronic systolic congestive heart failure with ejection fraction 35 to 40%, presented with abdominal pain nausea and diaphoresis. Patient was also hypotensive with a lactic acidosis and was admitted to ICU,with a pulseless left lower extremity was found to have femoral pseudoaneurysm.Culture came back with MRSA bacteremia-TEE neg for vegetations.  Patient had prolonged and complicated hospitalization course seen by multiple consultant as below: 08/29/22: Admitted to ICU-on vasopressors for septic shock -s/p OR with Dr. Karin Lieu for left femoral exploration with pseudoaneurysm repair,hematoma evacuation>showed dehiscence at proximal aspect of bovine pericardial patch at the suture line.  -Emergent CRRT for severe hyperkalemia 09/08/22 Transferred to [facility] service  09/09/22 CTA for bleeding on left groin: showed Thrombosis of LEFT CFA pseudoaneurysm with new 3.5 cm filling defect, consistent with arterial thrombus, extending from the PSA neck and into the distal CFA. This is at risk for distal embolization and potential acute LEFT lower extremity ischemia.New 8 mm distal LEFT EIA pseudoaneurysm, medial to the stent distal landing zone. Mild interval enlargement of retroperitoneal hematoma, in a craniocaudal dimension measuring 12.2 cm (previously 11.2 cm).No CTA evidence of active extravasation. Patent RIGHT femoral-to-popliteal graft and bilateral iliac stents.Severe burden of splanchnic arterial atherosclerosis, at risk for chronic mesenteric ischemia. 09/09/22: urgent OR:S/P left common iliac to profunda femoris bypass with ipsilateral nonreversed greater saphenous vein in anatomic tunnel, left common iliac artery angioplasty and stenting, left greater saphenous vein harvest, left  sartorius muscle of left groin, left lower extremity angiography with first-order cannulation Transferred to 2H post op 09/12/22 am: 2 BRBPR> s/p Flex sig>showed 5 rectal ulcers, not actively bleeding-stool brown-advised to avoid constipation, added Miralax BID 09/13/22 NF:AOZHY 2 large bloody BM ~ 500cc> got 1 unit PRBC. 4/11 repeat sigmoidoscopy-several bleeding ulcers, pathology came back positive for Crohn's and started on mesalamine, 4/18: massive rebleeding-not a candidate for IR due to severe PVD s/p emergent sigmoidoscopy with hemostatic gel applied to bleeding ulcer 4/20: s/p suturing of bleeding ulcers by Dr. Magnus Ivan in OR. Palliative care has been involved and has had multiple discussions between patient and family on goals-remains full code with full scope of care and signed off 5/8. 5/7: Patient having progressive ischemic changes to the toes no plan for intervention at this time is to improve clinically prior to elective AKA and continuing on wound care hydrotherapy.   Subjective: Seen and examined this morning no bowel movement Yesterday had episode of bleeding rectally although he had a normal stool Alert awake pain control daughter at the bedside   Assessment and Plan: Active Problems:   Lower GI bleed   Acute blood loss anemia   ESRD (end stage renal disease) on dialysis (HCC)   Pseudoaneurysm of femoral artery (HCC)   Rectal arterial hemorrhage   Rectal ulcer   Hematochezia   MRSA bacteremia   Malnutrition of moderate degree   Decreased functional mobility   Pressure injury of skin   Colonic ulcer    Acute blood loss anemia recurrent hematochezia from anal/rectal ulcer- Crohns' disease: Patient had acute blood loss anemia with recurrent hematochezia from anal/rectal ulcer status post multiple sigmoidoscopy-normal bleeding after EGD and oversewing of ulcers and packing and will schedule with Gelfoam-see chain of events above.  GI surgery signed off.  Continue budesonide  9 mg daily, per GI cont Canasa PR x 14 doses  from 5/9. S/p multiple transfusion ~ 40 units, continue to monitor and transfuse for less than 7 g-overall appears stable although noted some blood rectally yesterday.  If continues to have bleeding reconsult GI. Recent Labs    10/13/22 0829 10/14/22 0112 10/15/22 0140 10/15/22 2252 10/16/22 0849  HGB 7.2* 8.0* 7.7* 9.2* 8.8*  MCV 101.3* 99.2 98.4  --  98.9     Severe PVD Infected left femoral patch S/p ileofemoral bypass w/ sartorius muscle flap 4/3 Retroperitoneal hemorrhage Ischemic toes: Complicated vascular/surgical history -S/p left groin hematoma evacuation, primary repair of the distal external iliac artery. Further complicated on 4/3 by left groin bleeding with new pseudoaneurysm and arterial thrombus. S/p iliofemoral bypass and removal of infected patch with sartorius muscle flap. Having ongoing progressive ischemic changes to the toes> no plan for intervention at this time, plan is to improve clinically prior to elective AKA per VVS.Has wound vac on left groin.  See pictures below  MRSA/Enterococcus faecium bacteremia secondary to postop wound infection: Status post removal of infected patch (see above). seen by ID>4/8 recommending daptomycin for 6 weeks  last day 10/23/22, will need oral suppression later- so need to contact ID at that time.TEE is negative for vegetations.Monitor CPK weekly  Sacral pressure injury POA, unstageable/deep continue wound care, offloading bed mattress, position changing.  Continue hydrotherapy twice weekly per PT-due today, dosing with Dilaudid IV 0.5 mg prior to dressing change.  He had been on oxycodone RN concerned about confusion psot oxy- but daughter thinks it was OxyContin. I decrease the dose to 2.5 mg prn  if pain uncontrolled resume 5 mg prn. Cont tramadol prn . Continue to augment nutritional status for appropriate healing  ESRD on HD TTS, s/p short-term CRRT on 3/23 - 3/26, nephrology  following, RIJ TC +.  AVG clotted 3/27 status post declot by VVS then clotted again> s/p IR TDC placed 4/1, having bruit in AVG 5/7.continue HD per nephrology on board   Secondary hyperparathyroidism: Monitor calcium and Phos PTH.  Holding binders, on PhosLo per nephrology  Anemia ESRD and acute blood loss anemia see #1-continue Aranesp q Wednesday Recent Labs    10/06/22 0913 10/07/22 0500 10/08/22 0753 10/10/22 0020 10/11/22 0621 10/12/22 0605 10/13/22 0830 10/14/22 0112 10/15/22 0140 10/16/22 0119  BUN 100* 56* 94* 64* 57* 95* 129* 65* 106* 46*  CREATININE 7.03* 4.42* 5.99* 4.59* 4.39* 5.72* 7.23* 3.75* 5.34* 3.06*  CO2 23 24 22 26 26 25 24 27 25  25     Chronic systolic CHF EF 35-40% previously-TEE 4/1: EF 50-55%.  Severe LVH.  Address fluid with dialysis.Net IO Since Admission: 636.53 mL [10/16/22 0908]  Pulmonary hypertension-stable. Hypertension: BP stable on Toprol 25 Hyperlipidemia: LDL at goal 45 continue Crestor 10 mg  Acute metabolic encephalopathy: In the setting of uremia metabolic acidosis.  Has had extensive workup including MRI finding as below.  At times became sedated with opiates/pain medication.Continue supportive care fall precaution PT OT as tolerated delirium precaution. Weaning opiates.  Incidental lacunar stroke:4 mm acute infarct in the right subinsular white matter on MRI 09/04/2022: Patient had altered mental status and left leg weakness-MRI ordered incidental lacunar infarct, seen by neurology completed stroke workup echo EF 45% carotid Doppler bilateral ICA 40-59%, LDL 45 at goal, HbA1c 5.1.  PTA on Plavix.  At this time unable to use antiplatelet due to recurrent bleeding.  COPD: Respiratory status stable  Pain management/uncontrolled back pain: Currently on tylenol 650 mg q6hr (since 4/19-monitor LFTs), on dilaudid 1 mg>  change to 0.5 iv q4hr prn, oxy 5-10 mg> cahnged to 02.5 mg 5/10, Cont tramadol 50 mg q6hr prn.I discussed extensively cont to wean  down his opiates as able.  Moderate malnutrition Hypoalbuminemia: RD following augment nutritional status, Nutrition Problem: Moderate Malnutrition (in the context of chronic illness (ESRD)) Etiology: poor appetite Signs/Symptoms: moderate fat depletion, severe muscle depletion Interventions: Refer to RD note for recommendations, Liberalize Diet, Nepro shake, MVI   Pressure injuries beside sacrum on left thigh, left heel DTI, right thigh see below-continue wound care Pressure Injury 10/10/22 Thigh Left;Posterior;Proximal Stage 2 -  Partial thickness loss of dermis presenting as a shallow open injury with a red, pink wound bed without slough. pink,skin pilled off  Pressure Injury 09/04/22 Heel Left Deep Tissue Pressure Injury - Purple or maroon localized area of discolored intact skin or blood-filled blister due to damage of underlying soft tissue from pressure and/or shear. Pressure Injury 10/10/22 Thigh Posterior;Proximal;Right Stage 2 -  Partial thickness loss of dermis presenting as a shallow open injury with a red, pink wound bed without slough. painful, bleeding (Active)  Goals of care remains full code palliative has been involved multiple times.  Daughter believes patient has poor understanding of what is going on requesting to have palliative evaluate again palliative reconsulted 5/10- can be seen over the weekend  DVT prophylaxis: SCD's Start: 09/09/22 1841 Code Status:   Code Status: Full Code Family Communication: plan of care discussed with patient/daughter at bedside. Patient status is:  inpatient  because of deep sacral ulcer, left ischemic toes Level of care: Progressive   Dispo: The patient is from: home            Anticipated disposition: TBD.  Brought up the idea of LTAC with patient and daughter, [facility] consulted.  Objective: Vitals last 24 hrs: Vitals:   10/16/22 0019 10/16/22 0300 10/16/22 0402 10/16/22 0800  BP: 115/65  133/87 (!) 157/86  Pulse: 61 90 93 88  Resp:  15  20 15   Temp: 98.1 F (36.7 C)  98.3 F (36.8 C) 98.2 F (36.8 C)  TempSrc: Oral  Oral Oral  SpO2:   94% 97%  Weight:   62.5 kg   Height:       Weight change: 17.5 kg  Physical Examination: General exam: AAox3, weak,older appearing HEENT:Oral mucosa moist, Ear/Nose WNL grossly, dentition normal. Respiratory system: bilaterally clear BS, no use of accessory muscle Cardiovascular system: S1 & S2 +, regular rate. Gastrointestinal system: Abdomen soft, NT,ND,BS+ Nervous System:Alert, awake, moving extremities and grossly nonfocal Extremities: LE ankle edema neg, lower extremities warm Skin: No rashes,no icterus. MSK: Normal muscle bulk,tone, power  Left ground wound vac Sacral decubitus dressing+ TDC+ on left chest Rt arm w/ AVG w.  Fluid thrill.     Medications reviewed:  Scheduled Meds:  (feeding supplement) PROSource Plus  30 mL Oral TID WC   acetaminophen  650 mg Oral Q6H   ascorbic acid  250 mg Oral BID   budesonide  9 mg Oral Daily   calcium acetate  667 mg Oral TID WC   Chlorhexidine Gluconate Cloth  6 each Topical Q0600   Chlorhexidine Gluconate Cloth  6 each Topical Q0600   collagenase   Topical Daily   darbepoetin (ARANESP) injection - DIALYSIS  200 mcg Subcutaneous Q Wed-1800   liver oil-zinc oxide   Topical TID   mesalamine  1,000 mg Rectal QHS   methocarbamol  500 mg Oral TID   metoprolol succinate  25 mg Oral  Daily   multivitamin  1 tablet Oral QHS   nutrition supplement (JUVEN)  1 packet Oral BID BM   pantoprazole  40 mg Oral BID   polyethylene glycol  17 g Oral Daily   rosuvastatin  10 mg Oral Daily   sodium chloride flush  10-40 mL Intracatheter Q12H   vitamin A  10,000 Units Oral Daily   Continuous Infusions:  sodium chloride 0 mL/hr at 09/22/22 0757   DAPTOmycin (CUBICIN) 600 mg in sodium chloride 0.9 % IVPB 600 mg (10/15/22 1633)   DAPTOmycin (CUBICIN) 750 mg in sodium chloride 0.9 % IVPB 750 mg (10/10/22 1808)   sodium chloride     Diet  Order             Diet regular Room service appropriate? Yes; Fluid consistency: Thin; Fluid restriction: 1200 mL Fluid  Diet effective now                   Intake/Output Summary (Last 24 hours) at 10/16/2022 0908 Last data filed at 10/15/2022 2200 Gross per 24 hour  Intake 200 ml  Output 3000 ml  Net -2800 ml    Net IO Since Admission: 636.53 mL [10/16/22 0908]  Wt Readings from Last 3 Encounters:  10/16/22 62.5 kg  08/23/22 64.1 kg  08/15/22 64.9 kg     Unresulted Labs (From admission, onward)     Start     Ordered   10/15/22 0500  Basic metabolic panel  Daily,   R     Question:  Specimen collection method  Answer:  Unit=Unit collect   10/14/22 1054   10/15/22 0500  CBC  Daily,   R     Question:  Specimen collection method  Answer:  Unit=Unit collect   10/14/22 1054   09/28/22 0500  CK  Every Monday,   R     Question:  Specimen collection method  Answer:  Lab=Lab collect   09/21/22 1130          Data Reviewed: I have personally reviewed following labs and imaging studies CBC: Recent Labs  Lab 10/13/22 0829 10/14/22 0112 10/15/22 0140 10/15/22 2252 10/16/22 0849  WBC 13.8* 14.3* 14.5*  --  14.0*  HGB 7.2* 8.0* 7.7* 9.2* 8.8*  HCT 23.6* 25.8* 24.4* 29.9* 28.2*  MCV 101.3* 99.2 98.4  --  98.9  PLT 275 291 273  --  255    Basic Metabolic Panel: Recent Labs  Lab 10/10/22 0020 10/11/22 0621 10/12/22 0605 10/13/22 0830 10/14/22 0112 10/15/22 0140 10/16/22 0119  NA 138 135 137 135 135 134* 134*  K 4.4 4.3 4.8 5.6* 4.7 4.7 4.0  CL 96* 94* 95* 95* 92* 96* 95*  CO2 26 26 25 24 27 25 25   GLUCOSE 63* 89 94 95 111* 119* 84  BUN 64* 57* 95* 129* 65* 106* 46*  CREATININE 4.59* 4.39* 5.72* 7.23* 3.75* 5.34* 3.06*  CALCIUM 8.8* 8.5* 9.3 9.2 9.3 8.8* 8.1*  PHOS 4.2 4.2 4.3 4.4  --   --   --     GFR: Estimated Creatinine Clearance: 20.4 mL/min (A) (by C-G formula based on SCr of 3.06 mg/dL (H)). Liver Function Tests: Recent Labs  Lab 10/10/22 0020  10/11/22 0621 10/12/22 0605 10/13/22 0830 10/14/22 0112  AST  --   --   --   --  18  ALT  --   --   --   --  16  ALKPHOS  --   --   --   --  111  BILITOT  --   --   --   --  1.1  PROT  --   --   --   --  5.5*  ALBUMIN 1.9* 1.8* 1.8* 1.8* 1.7*   No results found for this or any previous visit (from the past 240 hour(s)).  Antimicrobials: Anti-infectives (From admission, onward)    Start     Dose/Rate Route Frequency Ordered Stop   09/27/22 1200  DAPTOmycin (CUBICIN) 750 mg in sodium chloride 0.9 % IVPB  Status:  Discontinued        750 mg 130 mL/hr over 30 Minutes Intravenous Daily-1800 09/26/22 1733 09/27/22 0924   09/27/22 1200  DAPTOmycin (CUBICIN) 750 mg in sodium chloride 0.9 % IVPB        750 mg 130 mL/hr over 30 Minutes Intravenous  Once 09/27/22 0923 09/27/22 1500   09/26/22 1800  DAPTOmycin (CUBICIN) 750 mg in sodium chloride 0.9 % IVPB        10 mg/kg  75.2 kg 130 mL/hr over 30 Minutes Intravenous Every Sat (1800) 09/24/22 1342 10/23/22 2359   09/24/22 1800  DAPTOmycin (CUBICIN) 600 mg in sodium chloride 0.9 % IVPB        8 mg/kg  75.2 kg 124 mL/hr over 30 Minutes Intravenous Once per day on Tue Thu 09/24/22 1342 10/23/22 2359   09/19/22 1800  DAPTOmycin (CUBICIN) 750 mg in sodium chloride 0.9 % IVPB  Status:  Discontinued        10 mg/kg  75.2 kg 130 mL/hr over 30 Minutes Intravenous Every Sat (1800) 09/14/22 1622 09/24/22 1342   09/16/22 0830  DAPTOmycin (CUBICIN) 600 mg in sodium chloride 0.9 % IVPB  Status:  Discontinued        8 mg/kg  74.8 kg 124 mL/hr over 30 Minutes Intravenous  Once 09/16/22 0738 09/16/22 1555   09/15/22 1800  DAPTOmycin (CUBICIN) 600 mg in sodium chloride 0.9 % IVPB  Status:  Discontinued        8 mg/kg  75.2 kg 124 mL/hr over 30 Minutes Intravenous Once per day on Tue Thu 09/14/22 1622 09/24/22 1342   09/12/22 1800  DAPTOmycin (CUBICIN) 600 mg in sodium chloride 0.9 % IVPB        600 mg 124 mL/hr over 30 Minutes Intravenous  Once  09/12/22 1049 09/12/22 2033   09/11/22 1615  DAPTOmycin (CUBICIN) 600 mg in sodium chloride 0.9 % IVPB        600 mg 124 mL/hr over 30 Minutes Intravenous  Once 09/11/22 1522 09/11/22 2042   09/10/22 1200  vancomycin (VANCOREADY) IVPB 500 mg/100 mL  Status:  Discontinued        500 mg 100 mL/hr over 60 Minutes Intravenous Every T-Th-Sa (Hemodialysis) 09/09/22 0719 09/10/22 0719   09/10/22 0815  vancomycin (VANCOREADY) IVPB 750 mg/150 mL        750 mg 150 mL/hr over 60 Minutes Intravenous  Once 09/10/22 0719 09/10/22 0939   09/10/22 0718  vancomycin variable dose per unstable renal function (pharmacist dosing)  Status:  Discontinued         Does not apply See admin instructions 09/10/22 0719 09/11/22 1522   09/09/22 2000  piperacillin-tazobactam (ZOSYN) IVPB 2.25 g  Status:  Discontinued        2.25 g 100 mL/hr over 30 Minutes Intravenous Every 8 hours 09/09/22 1849 09/10/22 1003   09/09/22 1145  ceFAZolin (ANCEF) 2-4 GM/100ML-% IVPB       Note to Pharmacy: Payton Emerald  A: cabinet override      09/09/22 1145 09/09/22 2359   09/08/22 0830  vancomycin (VANCOREADY) IVPB 750 mg/150 mL        750 mg 150 mL/hr over 60 Minutes Intravenous  Once 09/08/22 0743 09/08/22 1004   09/07/22 1200  ceFAZolin (ANCEF) IVPB 2g/100 mL premix       Note to Pharmacy: Give in IR for surgical prophylaxis   2 g 200 mL/hr over 30 Minutes Intravenous  Once 09/07/22 1100 09/07/22 1327   09/07/22 1101  ceFAZolin (ANCEF) 2-4 GM/100ML-% IVPB       Note to Pharmacy: Phebe Colla N: cabinet override      09/07/22 1101 09/07/22 1335   09/07/22 0734  vancomycin variable dose per unstable renal function (pharmacist dosing)  Status:  Discontinued         Does not apply See admin instructions 09/07/22 0734 09/09/22 0718   09/05/22 1600  vancomycin (VANCOREADY) IVPB 750 mg/150 mL        750 mg 150 mL/hr over 60 Minutes Intravenous  Once 09/05/22 1505 09/05/22 1717   09/02/22 1227  ceFAZolin (ANCEF) 2-4 GM/100ML-% IVPB        Note to Pharmacy: Isabel Caprice, Destiny: cabinet override      09/02/22 1227 09/03/22 0044   09/02/22 0520  vancomycin variable dose per unstable renal function (pharmacist dosing)  Status:  Discontinued         Does not apply See admin instructions 09/02/22 0521 09/05/22 0940   08/31/22 1800  vancomycin (VANCOREADY) IVPB 750 mg/150 mL  Status:  Discontinued        750 mg 150 mL/hr over 60 Minutes Intravenous Every 24 hours 08/31/22 0822 09/02/22 0521   08/30/22 1800  vancomycin (VANCOREADY) IVPB 1500 mg/300 mL        1,500 mg 150 mL/hr over 120 Minutes Intravenous STAT 08/30/22 1739 08/30/22 2139      Culture/Microbiology    Component Value Date/Time   SDES TISSUE 09/09/2022 1453   SPECREQUEST VESSEL PT ON VANC ANCEF 09/09/2022 1453   CULT  09/09/2022 1453    RARE STAPHYLOCOCCUS AUREUS SUSCEPTIBILITIES PERFORMED ON PREVIOUS CULTURE WITHIN THE LAST 5 DAYS. NO ANAEROBES ISOLATED Performed at [facility], [address] [place], [place] [address]    REPTSTATUS 09/14/2022 FINAL 09/09/2022 1453   Other culture-see note  Radiology Studies: No results found.   LOS: 49 days   Lanae Boast, MD [facility]  10/16/2022, 9:08 AM

## 2022-10-17 LAB — CBC
HCT: 23.8 % — ABNORMAL LOW (ref 39.0–52.0)
HCT: 22.9 % — ABNORMAL LOW (ref 39.0–52.0)
Hemoglobin: 7.2 g/dL — ABNORMAL LOW (ref 13.0–17.0)
Hemoglobin: 6.9 g/dL — CL (ref 13.0–17.0)
MCH: 29.8 pg (ref 26.0–34.0)
MCH: 30.1 pg (ref 26.0–34.0)
MCHC: 30.3 g/dL (ref 30.0–36.0)
MCHC: 30.1 g/dL (ref 30.0–36.0)
MCV: 98.3 fL (ref 80.0–100.0)
MCV: 100 fL (ref 80.0–100.0)
Platelets: 276 10*3/uL (ref 150–400)
Platelets: 303 10*3/uL (ref 150–400)
RBC: 2.42 MIL/uL — ABNORMAL LOW (ref 4.22–5.81)
RBC: 2.29 MIL/uL — ABNORMAL LOW (ref 4.22–5.81)
RDW: 19.2 % — ABNORMAL HIGH (ref 11.5–15.5)
RDW: 19.3 % — ABNORMAL HIGH (ref 11.5–15.5)
WBC: 16.7 10*3/uL — ABNORMAL HIGH (ref 4.0–10.5)
WBC: 17.4 10*3/uL — ABNORMAL HIGH (ref 4.0–10.5)
nRBC: 0 % (ref 0.0–0.2)
nRBC: 0 % (ref 0.0–0.2)

## 2022-10-17 LAB — BASIC METABOLIC PANEL
Anion gap: 14 (ref 5–15)
BUN: 95 mg/dL — ABNORMAL HIGH (ref 8–23)
CO2: 26 mmol/L (ref 22–32)
Calcium: 8.9 mg/dL (ref 8.9–10.3)
Chloride: 94 mmol/L — ABNORMAL LOW (ref 98–111)
Creatinine, Ser: 5.04 mg/dL — ABNORMAL HIGH (ref 0.61–1.24)
GFR, Estimated: 12 mL/min — ABNORMAL LOW (ref >60)
Glucose, Bld: 116 mg/dL — ABNORMAL HIGH (ref 70–99)
Potassium: 5.2 mmol/L — ABNORMAL HIGH (ref 3.5–5.1)
Sodium: 134 mmol/L — ABNORMAL LOW (ref 135–145)

## 2022-10-17 LAB — PREPARE RBC (CROSSMATCH)

## 2022-10-17 LAB — TYPE AND SCREEN

## 2022-10-17 LAB — BPAM RBC

## 2022-10-17 MED ORDER — HEPARIN SODIUM (PORCINE) 1000 UNIT/ML IJ SOLN
INTRAMUSCULAR | Status: AC
  Administered 2022-10-17: 1000 [IU]
  Filled 2022-10-17: qty 4

## 2022-10-17 MED ORDER — ALBUMIN HUMAN 25 % IV SOLN
INTRAVENOUS | Status: AC
  Administered 2022-10-17: 25 g
  Filled 2022-10-17: qty 100

## 2022-10-17 NOTE — Progress Notes (Signed)
[facility] Progress Note   Subjective:  Seen in room - chronic pain, but no CP/dyspnea. For HD today - using TDC.   Objective Vitals:   10/16/22 2253 10/16/22 2357 10/17/22 0300 10/17/22 0700  BP: (!) 172/83 (!) 141/80 116/75 100/70  Pulse: 75 70 77 84  Resp: 19 18 18 18   Temp: 98.1 F (36.7 C)  98.3 F (36.8 C) 97.9 F (36.6 C)  TempSrc: Oral  Oral Oral  SpO2: 97%  100% 100%  Weight:   64 kg   Height:       Physical Exam General: Chronically ill and frail man. NAD. Room air. Heart: RRR; no murmur Lungs: CTA anteriorly Abdomen: soft Extremities: No LE edema; ischemic L toes Dialysis Access: TDC, L forearm AVG + bruit  Additional Objective Labs: Basic Metabolic Panel: Recent Labs  Lab 10/11/22 0621 10/12/22 0605 10/13/22 0830 10/14/22 0112 10/15/22 0140 10/16/22 0119 10/17/22 0109  NA 135 137 135   < > 134* 134* 134*  K 4.3 4.8 5.6*   < > 4.7 4.0 5.2*  CL 94* 95* 95*   < > 96* 95* 94*  CO2 26 25 24    < > 25 25 26   GLUCOSE 89 94 95   < > 119* 84 116*  BUN 57* 95* 129*   < > 106* 46* 95*  CREATININE 4.39* 5.72* 7.23*   < > 5.34* 3.06* 5.04*  CALCIUM 8.5* 9.3 9.2   < > 8.8* 8.1* 8.9  PHOS 4.2 4.3 4.4  --   --   --   --    < > = values in this interval not displayed.   Liver Function Tests: Recent Labs  Lab 10/12/22 0605 10/13/22 0830 10/14/22 0112  AST  --   --  18  ALT  --   --  16  ALKPHOS  --   --  111  BILITOT  --   --  1.1  PROT  --   --  5.5*  ALBUMIN 1.8* 1.8* 1.7*   CBC: Recent Labs  Lab 10/13/22 0829 10/14/22 0112 10/15/22 0140 10/15/22 2252 10/16/22 0849 10/17/22 0109  WBC 13.8* 14.3* 14.5*  --  14.0* 16.7*  HGB 7.2* 8.0* 7.7* 9.2* 8.8* 7.2*  HCT 23.6* 25.8* 24.4* 29.9* 28.2* 23.8*  MCV 101.3* 99.2 98.4  --  98.9 98.3  PLT 275 291 273  --  255 276   Medications:  sodium chloride 0 mL/hr at 09/22/22 0757   DAPTOmycin (CUBICIN) 600 mg in sodium chloride 0.9 % IVPB 600 mg (10/15/22 1633)   DAPTOmycin (CUBICIN) 750  mg in sodium chloride 0.9 % IVPB 750 mg (10/10/22 1808)   sodium chloride      (feeding supplement) PROSource Plus  30 mL Oral TID WC   acetaminophen  650 mg Oral Q6H   ascorbic acid  250 mg Oral BID   budesonide  9 mg Oral Daily   calcium acetate  667 mg Oral TID WC   Chlorhexidine Gluconate Cloth  6 each Topical Q0600   Chlorhexidine Gluconate Cloth  6 each Topical Q0600   collagenase   Topical Daily   darbepoetin (ARANESP) injection - DIALYSIS  200 mcg Subcutaneous Q Wed-1800   liver oil-zinc oxide   Topical TID   mesalamine  1,000 mg Rectal QHS   methocarbamol  500 mg Oral TID   metoprolol succinate  25 mg Oral Daily   multivitamin  1 tablet Oral QHS   nutrition supplement (JUVEN)  1 packet Oral BID BM   pantoprazole  40 mg Oral BID   polyethylene glycol  17 g Oral Daily   rosuvastatin  10 mg Oral Daily   sodium chloride flush  10-40 mL Intracatheter Q12H   vitamin A  10,000 Units Oral Daily    Dialysis Orders: TTS SW  4h   350/800  64.5kg  new TDC (RFA AVG clotted)  Heparin none  - venofer 50mg  IV weekly - rocaltrol 0.25 mcg po tiw - phoslo 2 ac tid - no esa, last Hb 11.0   Assessment/Plan: Recurrent lower GIB: S/p colonoscopy 09/15/22, then repeat sigmoidoscopy 4/11. Several bleeding ulcers addressed.  Path came back + Crohn's - started on mesalamine by GI. Brisk re-bleed 4/18, took again for emergent sigmoidoscopy with hemostatic gel applied to bleeding ulcer. Then bled again requiring suturing of bleeding ulcers 4/20 by Dr. Magnus Ivan. Hgb 8.8 -> 7.2 today, follow. 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 S/p iliofemoral bypass, removal of infected patch with sartorius muscle flap 09/09/22. Ischemic changes to L 3rd-5th toes persist. 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 CVA on  MRI. Neuro signed off.  ESRD: S/p short-term CRRT 3/23- 3/26, now back on iHD on TTS schedule. Next HD today using TDC.  BP/volume: No LE edema, below prior EDW. Anemia (ESRD + ABLA): Last transfused 1U on 4/25 (total of 25U this admit). Also on Aranesp q Wed (max dose).  Secondary HPTH: CorrCa high, phos at goal. Holding VDRA, decreased phoslo dose Dialysis access: AVG clotted 3/27, went for declot by VVS and then re-clotted. VVS placed R TDC on 4/1 but there is a strong bruit in the AVG 5/7 - attempted and unable to cannulate on 5/9. Use catheter for now. Hypoalbuminemia - Alb 1.7, in hospital > 40 days. Palliative care has revisited, see their notes. Pt has not been responsive to suggestions like DNR, etc. Remains full code.   Ozzie Hoyle, PA-C 10/17/2022, 8:54 AM  [employer]

## 2022-10-17 NOTE — Progress Notes (Signed)
PT Cancellation Note  Patient Details Name: DRAPER VALLIS MRN: [id number] DOB: 1955/08/16   Cancelled Treatment:    Reason Eval/Treat Not Completed: (P) Patient at procedure or test/unavailable (pt at HD dept. Note plan for 1 unit PRBC today as well.) Will continue efforts per PT plan of care as schedule permits.   Dorathy Kinsman Rodolphe Ed[place]ston 10/17/2022, 1:52 PM* delayed entry

## 2022-10-17 NOTE — Progress Notes (Signed)
PROGRESS NOTE ARGLE MICA  ZOX:[id number] DOB: 1956-02-04 DOA: 08/28/2022 PCP: [name], NP  Brief Narrative/Hospital Course: As per prior documentation done by Lanae Boast, MD:  71 yom w/ complicated medical history including ESRD on HD TTS, PVD,COPD, essential hypertension, chronic systolic congestive heart failure with ejection fraction 35 to 40%, presented with abdominal pain nausea and diaphoresis. Patient was also hypotensive with a lactic acidosis and was admitted to ICU,with a pulseless left lower extremity was found to have femoral pseudoaneurysm.Culture came back with MRSA bacteremia-TEE neg for vegetations.  Patient had prolonged and complicated hospitalization course seen by multiple consultant as below: 08/29/22: Admitted to ICU-on vasopressors for septic shock -s/p OR with Dr. Karin Lieu for left femoral exploration with pseudoaneurysm repair,hematoma evacuation>showed dehiscence at proximal aspect of bovine pericardial patch at the suture line.  -Emergent CRRT for severe hyperkalemia 09/08/22 Transferred to [facility]  09/09/22 CTA for bleeding on left groin: showed Thrombosis of LEFT CFA pseudoaneurysm with new 3.5 cm filling defect, consistent with arterial thrombus, extending from the PSA neck and into the distal CFA. This is at risk for distal embolization and potential acute LEFT lower extremity ischemia.New 8 mm distal LEFT EIA pseudoaneurysm, medial to the stent distal landing zone. Mild interval enlargement of retroperitoneal hematoma, in a craniocaudal dimension measuring 12.2 cm (previously 11.2 cm).No CTA evidence of active extravasation. Patent RIGHT femoral-to-popliteal graft and bilateral iliac stents.Severe burden of splanchnic arterial atherosclerosis, at risk for chronic mesenteric ischemia. 09/09/22: urgent OR:S/P left common iliac to profunda femoris bypass with ipsilateral nonreversed greater saphenous vein in anatomic tunnel, left common iliac artery angioplasty and  stenting, left greater saphenous vein harvest, left sartorius muscle of left groin, left lower extremity angiography with first-order cannulation Transferred to 2H post op 09/12/22 am: 2 BRBPR> s/p Flex sig>showed 5 rectal ulcers, not actively bleeding-stool brown-advised to avoid constipation, added Miralax BID 09/13/22 WJ:XBJYN 2 large bloody BM ~ 500cc> got 1 unit PRBC. 4/11 repeat sigmoidoscopy-several bleeding ulcers, pathology came back positive for Crohn's and started on mesalamine, 4/18: massive rebleeding-not a candidate for IR due to severe PVD s/p emergent sigmoidoscopy with hemostatic gel applied to bleeding ulcer 4/20: s/p suturing of bleeding ulcers by Dr. Magnus Ivan in OR. Palliative care has been involved and has had multiple discussions between patient and family on goals-remains full code with full scope of care and signed off 5/8. 5/7: Patient having progressive ischemic changes to the toes no plan for intervention at this time is to improve clinically prior to elective AKA and continuing on wound care hydrotherapy".  10/17/2022: Patient seen (this is my first contact with the patient).  Patient seen on hemodialysis.  Patient is currently being diuresed, with goal UF of 1.5 L.  Patient has been given IV albumin 25 g with hemodialysis.  Systolic blood pressure is 111 mmHg.  Patient is not in any distress.  However, hemoglobin is noted to have dropped to 6.9 g/dL.  This is likely secondary to acute blood loss anemia and anemia of chronic kidney disease.  Will transfuse 1 unit of packed red blood cell with hemodialysis.     Subjective: Patient seen. No new complaints. No chest pain no shortness of breath No fever or chills No other constitutional symptoms endorsed. Patient is not a particularly good historian.  Assessment and Plan: Active Problems:   Lower GI bleed   ESRD (end stage renal disease) on dialysis (HCC)   Malnutrition of moderate degree   Pseudoaneurysm of femoral artery  (HCC)  Decreased functional mobility   Pressure injury of skin   Colonic ulcer   Rectal arterial hemorrhage   Rectal ulcer   Hematochezia   Acute blood loss anemia   MRSA bacteremia    Acute blood loss anemia recurrent hematochezia from anal/rectal ulcer- Crohns' disease: Patient had acute blood loss anemia with recurrent hematochezia from anal/rectal ulcer status post multiple sigmoidoscopy-normal bleeding after EGD and oversewing of ulcers and packing and will schedule with Gelfoam-see chain of events above.  GI surgery signed off.  Continue budesonide 9 mg daily, per GI cont Canasa PR x 14 doses from 5/9. S/p multiple transfusion ~ 40 units, continue to monitor and transfuse for less than 7 g-overall appears stable although noted some blood rectally yesterday.  If continues to have bleeding reconsult GI. Recent Labs    10/15/22 0140 10/15/22 2252 10/16/22 0849 10/17/22 0109 10/17/22 0931  HGB 7.7* 9.2* 8.8* 7.2* 6.9*  MCV 98.4  --  98.9 98.3 100.0   10/17/2022: See above documentation.  Patient returns she is going to need of packed red blood cell.  Last hemoglobin was 6.9 g/dL.  Continue to monitor H/H.  Nursing staff reports rectal bleed overnight.   Severe PVD Infected left femoral patch S/p ileofemoral bypass w/ sartorius muscle flap 4/3 Retroperitoneal hemorrhage Ischemic toes: Complicated vascular/surgical history -S/p left groin hematoma evacuation, primary repair of the distal external iliac artery. Further complicated on 4/3 by left groin bleeding with new pseudoaneurysm and arterial thrombus. S/p iliofemoral bypass and removal of infected patch with sartorius muscle flap. Having ongoing progressive ischemic changes to the toes> no plan for intervention at this time, plan is to improve clinically prior to elective AKA per VVS.Has wound vac on left groin.  See pictures below 10/17/2022: With the effect of the vascular surgery team.  Patient will need left AKA when  optimized.  MRSA/Enterococcus faecium bacteremia secondary to postop wound infection: Status post removal of infected patch (see above). seen by ID>4/8 recommending daptomycin for 6 weeks  last day 10/23/22, will need oral suppression later- so need to contact ID at that time.TEE is negative for vegetations.Monitor CPK weekly 10/17/2022:  Continue IV daptomycin.  Monitor CPK weekly.  Sacral pressure injury POA, unstageable/deep continue wound care, offloading bed mattress, position changing.  Continue hydrotherapy twice weekly per PT-due today, dosing with Dilaudid IV 0.5 mg prior to dressing change.  He had been on oxycodone RN concerned about confusion psot oxy- but daughter thinks it was OxyContin. I decrease the dose to 2.5 mg prn  if pain uncontrolled resume 5 mg prn. Cont tramadol prn . Continue to augment nutritional status for appropriate healing  ESRD on HD TTS, s/p short-term CRRT on 3/23 - 3/26, nephrology following, RIJ TC +.  AVG clotted 3/27 status post declot by VVS then clotted again> s/p IR TDC placed 4/1, having bruit in AVG 5/7.continue HD per nephrology on board  10/17/2022: Will defer to the nephrology team.  Patient is currently being dialyzed.  Will transfuse 1 unit of packed red blood cells with dialysis.  Secondary hyperparathyroidism: Monitor calcium and Phos PTH.  Holding binders, on PhosLo per nephrology  Anemia ESRD and acute blood loss anemia see #1-continue Aranesp q Wednesday Recent Labs    10/07/22 0500 10/08/22 0753 10/10/22 0020 10/11/22 0621 10/12/22 0605 10/13/22 0830 10/14/22 0112 10/15/22 0140 10/16/22 0119 10/17/22 0109  BUN 56* 94* 64* 57* 95* 129* 65* 106* 46* 95*  CREATININE 4.42* 5.99* 4.59* 4.39* 5.72* 7.23* 3.75* 5.34* 3.06*  5.04*  CO2 24 22 26 26 25 24 27 25 25  26     Chronic systolic CHF EF 35-40% previously-TEE 4/1: EF 50-55%.  Severe LVH.  Address fluid with dialysis.Net IO Since Admission: 1,435.53 mL [10/17/22 1044]  10/17/2022:  Seems  compensated at the moment.  Pulmonary hypertension-stable.  Hypertension: BP stable on Toprol 25 10/17/2022:  Stable.  IV albumin with HD.   Hyperlipidemia: LDL at goal 45 continue Crestor 10 mg  Acute metabolic encephalopathy: In the setting of uremia metabolic acidosis.  Has had extensive workup including MRI finding as below.  At times became sedated with opiates/pain medication.Continue supportive care fall precaution PT OT as tolerated delirium precaution. Weaning opiates.  Incidental lacunar stroke:4 mm acute infarct in the right subinsular white matter on MRI 09/04/2022: Patient had altered mental status and left leg weakness-MRI ordered incidental lacunar infarct, seen by neurology completed stroke workup echo EF 45% carotid Doppler bilateral ICA 40-59%, LDL 45 at goal, HbA1c 5.1.  PTA on Plavix.  At this time unable to use antiplatelet due to recurrent bleeding.  COPD: Respiratory status stable  Pain management/uncontrolled back pain: Currently on tylenol 650 mg q6hr (since 4/19-monitor LFTs), on dilaudid 1 mg> change to 0.5 iv q4hr prn, oxy 5-10 mg> cahnged to 02.5 mg 5/10, Cont tramadol 50 mg q6hr prn.I discussed extensively cont to wean down his opiates as able. 10/17/2022: Discontinue PRN Tramadol.  Moderate malnutrition Hypoalbuminemia: RD following augment nutritional status, Nutrition Problem: Moderate Malnutrition (in the context of chronic illness (ESRD)) Etiology: poor appetite Signs/Symptoms: moderate fat depletion, severe muscle depletion Interventions: Refer to RD note for recommendations, Liberalize Diet, Nepro shake, MVI   Pressure injuries beside sacrum on left thigh, left heel DTI, right thigh see below-continue wound care Pressure Injury 10/10/22 Thigh Left;Posterior;Proximal Stage 2 -  Partial thickness loss of dermis presenting as a shallow open injury with a red, pink wound bed without slough. pink,skin pilled off  Pressure Injury 09/04/22 Heel Left Deep Tissue  Pressure Injury - Purple or maroon localized area of discolored intact skin or blood-filled blister due to damage of underlying soft tissue from pressure and/or shear. Pressure Injury 10/10/22 Thigh Posterior;Proximal;Right Stage 2 -  Partial thickness loss of dermis presenting as a shallow open injury with a red, pink wound bed without slough. painful, bleeding (Active)  Goals of care remains full code palliative has been involved multiple times.  Daughter believes patient has poor understanding of what is going on requesting to have palliative evaluate again palliative reconsulted 5/10- can be seen over the weekend  DVT prophylaxis: SCD's Start: 09/09/22 1841 Code Status:   Code Status: DNR Family Communication: plan of care discussed with patient/daughter at bedside. Patient status is:  inpatient  because of deep sacral ulcer, left ischemic toes Level of care: Progressive   Dispo: The patient is from: home            Anticipated disposition: TBD.  Brought up the idea of LTAC with patient and daughter, [facility] consulted.  Objective: Vitals last 24 hrs: Vitals:   10/17/22 0922 10/17/22 0930 10/17/22 0948 10/17/22 0956  BP: (!) 83/64 100/70  136/61  Pulse: 74 74  73  Resp: 20 20  18   Temp: 97.7 F (36.5 C)     TempSrc:      SpO2: 100%     Weight:   64 kg   Height:       Weight change: -9 kg  Physical Examination: General exam: Patient is chronically  ill looking.  AAO x 3.  Black/discoloration of the left third, fourth and fifth toes. HEENT: Patient is pale.  No jaundice. Respiratory system: Clear to auscultation.   Cardiovascular system: S1 S2. Gastrointestinal system: Soft and nontender. Nervous System:  Alert, awake.  Patient cannot move the left lower extremity  Extremities: No ankle edema. Skin: Dark discoloration/black left third, fourth and fifth toes.  Left ground wound vac Sacral decubitus dressing+ TDC+ on left chest Rt arm w/ AVG w.  Fluid thrill.     Medications  reviewed:  Scheduled Meds:  (feeding supplement) PROSource Plus  30 mL Oral TID WC   acetaminophen  650 mg Oral Q6H   ascorbic acid  250 mg Oral BID   budesonide  9 mg Oral Daily   calcium acetate  667 mg Oral TID WC   Chlorhexidine Gluconate Cloth  6 each Topical Q0600   Chlorhexidine Gluconate Cloth  6 each Topical Q0600   collagenase   Topical Daily   darbepoetin (ARANESP) injection - DIALYSIS  200 mcg Subcutaneous Q Wed-1800   heparin sodium (porcine)       liver oil-zinc oxide   Topical TID   mesalamine  1,000 mg Rectal QHS   methocarbamol  500 mg Oral TID   metoprolol succinate  25 mg Oral Daily   multivitamin  1 tablet Oral QHS   nutrition supplement (JUVEN)  1 packet Oral BID BM   pantoprazole  40 mg Oral BID   polyethylene glycol  17 g Oral Daily   rosuvastatin  10 mg Oral Daily   sodium chloride flush  10-40 mL Intracatheter Q12H   vitamin A  10,000 Units Oral Daily   Continuous Infusions:  sodium chloride 0 mL/hr at 09/22/22 0757   DAPTOmycin (CUBICIN) 600 mg in sodium chloride 0.9 % IVPB 600 mg (10/15/22 1633)   DAPTOmycin (CUBICIN) 750 mg in sodium chloride 0.9 % IVPB 750 mg (10/10/22 1808)   sodium chloride     Diet Order             Diet regular Room service appropriate? Yes; Fluid consistency: Thin; Fluid restriction: 1200 mL Fluid  Diet effective now                   Intake/Output Summary (Last 24 hours) at 10/17/2022 1044 Last data filed at 10/17/2022 0800 Gross per 24 hour  Intake 800 ml  Output 1 ml  Net 799 ml    Net IO Since Admission: 1,435.53 mL [10/17/22 1044]  Wt Readings from Last 3 Encounters:  10/17/22 64 kg  08/23/22 64.1 kg  08/15/22 64.9 kg     Unresulted Labs (From admission, onward)     Start     Ordered   10/17/22 1038  Type and screen [facility]  Once,   R       Comments: [facility]    10/17/22 1037   10/15/22 0500  Basic metabolic panel  Daily,   R     Question:  Specimen collection  method  Answer:  Unit=Unit collect   10/14/22 1054   10/15/22 0500  CBC  Daily,   R     Question:  Specimen collection method  Answer:  Unit=Unit collect   10/14/22 1054   09/28/22 0500  CK  Every Monday,   R     Question:  Specimen collection method  Answer:  Lab=Lab collect   09/21/22 1130  Data Reviewed: I have personally reviewed following labs and imaging studies CBC: Recent Labs  Lab 10/14/22 0112 10/15/22 0140 10/15/22 2252 10/16/22 0849 10/17/22 0109 10/17/22 0931  WBC 14.3* 14.5*  --  14.0* 16.7* 17.4*  HGB 8.0* 7.7* 9.2* 8.8* 7.2* 6.9*  HCT 25.8* 24.4* 29.9* 28.2* 23.8* 22.9*  MCV 99.2 98.4  --  98.9 98.3 100.0  PLT 291 273  --  255 276 303    Basic Metabolic Panel: Recent Labs  Lab 10/11/22 0621 10/12/22 0605 10/13/22 0830 10/14/22 0112 10/15/22 0140 10/16/22 0119 10/17/22 0109  NA 135 137 135 135 134* 134* 134*  K 4.3 4.8 5.6* 4.7 4.7 4.0 5.2*  CL 94* 95* 95* 92* 96* 95* 94*  CO2 26 25 24 27 25 25 26   GLUCOSE 89 94 95 111* 119* 84 116*  BUN 57* 95* 129* 65* 106* 46* 95*  CREATININE 4.39* 5.72* 7.23* 3.75* 5.34* 3.06* 5.04*  CALCIUM 8.5* 9.3 9.2 9.3 8.8* 8.1* 8.9  PHOS 4.2 4.3 4.4  --   --   --   --     GFR: Estimated Creatinine Clearance: 12.7 mL/min (A) (by C-G formula based on SCr of 5.04 mg/dL (H)). Liver Function Tests: Recent Labs  Lab 10/11/22 1610 10/12/22 0605 10/13/22 0830 10/14/22 0112  AST  --   --   --  18  ALT  --   --   --  16  ALKPHOS  --   --   --  111  BILITOT  --   --   --  1.1  PROT  --   --   --  5.5*  ALBUMIN 1.8* 1.8* 1.8* 1.7*   No results found for this or any previous visit (from the past 240 hour(s)).  Antimicrobials: Anti-infectives (From admission, onward)    Start     Dose/Rate Route Frequency Ordered Stop   09/27/22 1200  DAPTOmycin (CUBICIN) 750 mg in sodium chloride 0.9 % IVPB  Status:  Discontinued        750 mg 130 mL/hr over 30 Minutes Intravenous Daily-1800 09/26/22 1733 09/27/22 0924    09/27/22 1200  DAPTOmycin (CUBICIN) 750 mg in sodium chloride 0.9 % IVPB        750 mg 130 mL/hr over 30 Minutes Intravenous  Once 09/27/22 0923 09/27/22 1500   09/26/22 1800  DAPTOmycin (CUBICIN) 750 mg in sodium chloride 0.9 % IVPB        10 mg/kg  75.2 kg 130 mL/hr over 30 Minutes Intravenous Every Sat (1800) 09/24/22 1342 10/23/22 2359   09/24/22 1800  DAPTOmycin (CUBICIN) 600 mg in sodium chloride 0.9 % IVPB        8 mg/kg  75.2 kg 124 mL/hr over 30 Minutes Intravenous Once per day on Tue Thu 09/24/22 1342 10/23/22 2359   09/19/22 1800  DAPTOmycin (CUBICIN) 750 mg in sodium chloride 0.9 % IVPB  Status:  Discontinued        10 mg/kg  75.2 kg 130 mL/hr over 30 Minutes Intravenous Every Sat (1800) 09/14/22 1622 09/24/22 1342   09/16/22 0830  DAPTOmycin (CUBICIN) 600 mg in sodium chloride 0.9 % IVPB  Status:  Discontinued        8 mg/kg  74.8 kg 124 mL/hr over 30 Minutes Intravenous  Once 09/16/22 0738 09/16/22 1555   09/15/22 1800  DAPTOmycin (CUBICIN) 600 mg in sodium chloride 0.9 % IVPB  Status:  Discontinued        8 mg/kg  75.2 kg  124 mL/hr over 30 Minutes Intravenous Once per day on Tue Thu 09/14/22 1622 09/24/22 1342   09/12/22 1800  DAPTOmycin (CUBICIN) 600 mg in sodium chloride 0.9 % IVPB        600 mg 124 mL/hr over 30 Minutes Intravenous  Once 09/12/22 1049 09/12/22 2033   09/11/22 1615  DAPTOmycin (CUBICIN) 600 mg in sodium chloride 0.9 % IVPB        600 mg 124 mL/hr over 30 Minutes Intravenous  Once 09/11/22 1522 09/11/22 2042   09/10/22 1200  vancomycin (VANCOREADY) IVPB 500 mg/100 mL  Status:  Discontinued        500 mg 100 mL/hr over 60 Minutes Intravenous Every T-Th-Sa (Hemodialysis) 09/09/22 0719 09/10/22 0719   09/10/22 0815  vancomycin (VANCOREADY) IVPB 750 mg/150 mL        750 mg 150 mL/hr over 60 Minutes Intravenous  Once 09/10/22 0719 09/10/22 0939   09/10/22 0718  vancomycin variable dose per unstable renal function (pharmacist dosing)  Status:   Discontinued         Does not apply See admin instructions 09/10/22 0719 09/11/22 1522   09/09/22 2000  piperacillin-tazobactam (ZOSYN) IVPB 2.25 g  Status:  Discontinued        2.25 g 100 mL/hr over 30 Minutes Intravenous Every 8 hours 09/09/22 1849 09/10/22 1003   09/09/22 1145  ceFAZolin (ANCEF) 2-4 GM/100ML-% IVPB       Note to Pharmacy: Payton Emerald A: cabinet override      09/09/22 1145 09/09/22 2359   09/08/22 0830  vancomycin (VANCOREADY) IVPB 750 mg/150 mL        750 mg 150 mL/hr over 60 Minutes Intravenous  Once 09/08/22 0743 09/08/22 1004   09/07/22 1200  ceFAZolin (ANCEF) IVPB 2g/100 mL premix       Note to Pharmacy: Give in IR for surgical prophylaxis   2 g 200 mL/hr over 30 Minutes Intravenous  Once 09/07/22 1100 09/07/22 1327   09/07/22 1101  ceFAZolin (ANCEF) 2-4 GM/100ML-% IVPB       Note to Pharmacy: Phebe Colla N: cabinet override      09/07/22 1101 09/07/22 1335   09/07/22 0734  vancomycin variable dose per unstable renal function (pharmacist dosing)  Status:  Discontinued         Does not apply See admin instructions 09/07/22 0734 09/09/22 0718   09/05/22 1600  vancomycin (VANCOREADY) IVPB 750 mg/150 mL        750 mg 150 mL/hr over 60 Minutes Intravenous  Once 09/05/22 1505 09/05/22 1717   09/02/22 1227  ceFAZolin (ANCEF) 2-4 GM/100ML-% IVPB       Note to Pharmacy: Isabel Caprice, Destiny: cabinet override      09/02/22 1227 09/03/22 0044   09/02/22 0520  vancomycin variable dose per unstable renal function (pharmacist dosing)  Status:  Discontinued         Does not apply See admin instructions 09/02/22 0521 09/05/22 0940   08/31/22 1800  vancomycin (VANCOREADY) IVPB 750 mg/150 mL  Status:  Discontinued        750 mg 150 mL/hr over 60 Minutes Intravenous Every 24 hours 08/31/22 0822 09/02/22 0521   08/30/22 1800  vancomycin (VANCOREADY) IVPB 1500 mg/300 mL        1,500 mg 150 mL/hr over 120 Minutes Intravenous STAT 08/30/22 1739 08/30/22 2139       Culture/Microbiology    Component Value Date/Time   SDES TISSUE 09/09/2022 1453   SPECREQUEST VESSEL PT ON [facility]  ANCEF 09/09/2022 1453   CULT  09/09/2022 1453    RARE STAPHYLOCOCCUS AUREUS SUSCEPTIBILITIES PERFORMED ON PREVIOUS CULTURE WITHIN THE LAST 5 DAYS. NO ANAEROBES ISOLATED Performed at [facility], [address] [place], [place] [address]    REPTSTATUS 09/14/2022 FINAL 09/09/2022 1453   Other culture-see note  Radiology Studies: No results found.   LOS: 50 days   Time spent: 55 minutes.  Barnetta Chapel, MD [facility]  10/17/2022, 10:44 AM

## 2022-10-17 NOTE — Progress Notes (Signed)
Received patient in bed to unit.  Alert and oriented.  Informed consent signed and in chart.   TX duration:4 h  Patient tolerated well.  Transported back to the room  Alert, without acute distress.  Hand-off given to patient's nurse.   Access used: catheter Access issues: BFR 350. Lines reversed.  Total UF removed: 1.3 L Medication(s) given: Oxycodone 2.5 mg po. Dilaudid 0.5 mg IV. Albumin 25 g IV. One unit PRBC. Post HD VS: 120/70 P 90. R 18. O2sat 100 % in room air. Post HD weight: 63 kg   [facility]

## 2022-10-18 LAB — RENAL FUNCTION PANEL
Albumin: 2 g/dL — ABNORMAL LOW (ref 3.5–5.0)
Anion gap: 14 (ref 5–15)
BUN: 39 mg/dL — ABNORMAL HIGH (ref 8–23)
CO2: 27 mmol/L (ref 22–32)
Calcium: 8.6 mg/dL — ABNORMAL LOW (ref 8.9–10.3)
Chloride: 93 mmol/L — ABNORMAL LOW (ref 98–111)
Creatinine, Ser: 2.74 mg/dL — ABNORMAL HIGH (ref 0.61–1.24)
GFR, Estimated: 24 mL/min — ABNORMAL LOW (ref >60)
Glucose, Bld: 96 mg/dL (ref 70–99)
Phosphorus: 3.1 mg/dL (ref 2.5–4.6)
Potassium: 3.5 mmol/L (ref 3.5–5.1)
Sodium: 134 mmol/L — ABNORMAL LOW (ref 135–145)

## 2022-10-18 LAB — CBC WITH DIFFERENTIAL/PLATELET
Abs Immature Granulocytes: 0.08 10*3/uL — ABNORMAL HIGH (ref 0.00–0.07)
Basophils Absolute: 0 10*3/uL (ref 0.0–0.1)
Basophils Relative: 0 %
Eosinophils Absolute: 0.2 10*3/uL (ref 0.0–0.5)
Eosinophils Relative: 1 %
HCT: 23.9 % — ABNORMAL LOW (ref 39.0–52.0)
Hemoglobin: 7.6 g/dL — ABNORMAL LOW (ref 13.0–17.0)
Immature Granulocytes: 1 %
Lymphocytes Relative: 5 %
Lymphs Abs: 0.7 10*3/uL (ref 0.7–4.0)
MCH: 31.1 pg (ref 26.0–34.0)
MCHC: 31.8 g/dL (ref 30.0–36.0)
MCV: 98 fL (ref 80.0–100.0)
Monocytes Absolute: 0.8 10*3/uL (ref 0.1–1.0)
Monocytes Relative: 5 %
Neutro Abs: 14.2 10*3/uL — ABNORMAL HIGH (ref 1.7–7.7)
Neutrophils Relative %: 88 %
Platelets: 227 10*3/uL (ref 150–400)
RBC: 2.44 MIL/uL — ABNORMAL LOW (ref 4.22–5.81)
RDW: 18.2 % — ABNORMAL HIGH (ref 11.5–15.5)
WBC: 16 10*3/uL — ABNORMAL HIGH (ref 4.0–10.5)
nRBC: 0.1 % (ref 0.0–0.2)

## 2022-10-18 LAB — TYPE AND SCREEN: Antibody Screen: NEGATIVE

## 2022-10-18 LAB — MAGNESIUM: Magnesium: 1.7 mg/dL (ref 1.7–2.4)

## 2022-10-18 MED ORDER — HYDROMORPHONE HCL 1 MG/ML IJ SOLN
0.5000 mg | INTRAMUSCULAR | Status: DC | PRN
  Administered 2022-10-18 – 2022-10-22 (×15): 0.5 mg via INTRAVENOUS
  Filled 2022-10-18 (×15): qty 0.5

## 2022-10-18 MED ORDER — OXYCODONE-ACETAMINOPHEN 5-325 MG PO TABS
1.0000 | ORAL_TABLET | ORAL | Status: DC | PRN
  Administered 2022-10-19 – 2022-10-22 (×8): 1 via ORAL
  Filled 2022-10-18 (×8): qty 1

## 2022-10-18 NOTE — Progress Notes (Signed)
PROGRESS NOTE Albert Harris  ZOX:[id number] DOB: 06-28-1955 DOA: 08/28/2022 PCP: [name], NP  Brief Narrative/Hospital Course: Patient is a 67 year old male with past medical history significant for ESRD on HD TTS, PVD,COPD, essential hypertension, chronic systolic congestive heart failure with ejection fraction 35 to 40%, presented with abdominal pain nausea and diaphoresis. Patient was also hypotensive with a lactic acidosis and was admitted to ICU,with a pulseless left lower extremity was found to have femoral pseudoaneurysm.Culture came back with MRSA bacteremia-TEE neg for vegetations.  Patient had prolonged and complicated hospitalization course seen by multiple consultant as below:  08/29/22: Admitted to ICU-on vasopressors for septic shock -s/p OR with Dr. Karin Lieu for left femoral exploration with pseudoaneurysm repair,hematoma evacuation>showed dehiscence at proximal aspect of bovine pericardial patch at the suture line.  -Emergent CRRT for severe hyperkalemia 09/08/22 Transferred to [facility] service  09/09/22 CTA for bleeding on left groin: showed Thrombosis of LEFT CFA pseudoaneurysm with new 3.5 cm filling defect, consistent with arterial thrombus, extending from the PSA neck and into the distal CFA. This is at risk for distal embolization and potential acute LEFT lower extremity ischemia.New 8 mm distal LEFT EIA pseudoaneurysm, medial to the stent distal landing zone. Mild interval enlargement of retroperitoneal hematoma, in a craniocaudal dimension measuring 12.2 cm (previously 11.2 cm).No CTA evidence of active extravasation. Patent RIGHT femoral-to-popliteal graft and bilateral iliac stents.Severe burden of splanchnic arterial atherosclerosis, at risk for chronic mesenteric ischemia. 09/09/22: urgent OR:S/P left common iliac to profunda femoris bypass with ipsilateral nonreversed greater saphenous vein in anatomic tunnel, left common iliac artery angioplasty and stenting, left greater  saphenous vein harvest, left sartorius muscle of left groin, left lower extremity angiography with first-order cannulation Transferred to 2H post op 09/12/22 am: 2 BRBPR> s/p Flex sig>showed 5 rectal ulcers, not actively bleeding-stool brown-advised to avoid constipation, added Miralax BID 09/13/22 WJ:XBJYN 2 large bloody BM ~ 500cc> got 1 unit PRBC. 4/11 repeat sigmoidoscopy-several bleeding ulcers, pathology came back positive for Crohn's and started on mesalamine, 4/18: massive rebleeding-not a candidate for IR due to severe PVD s/p emergent sigmoidoscopy with hemostatic gel applied to bleeding ulcer 4/20: s/p suturing of bleeding ulcers by Dr. Magnus Ivan in OR. Palliative care has been involved and has had multiple discussions between patient and family on goals-remains full code with full scope of care and signed off 5/8. 5/7: Patient having progressive ischemic changes to the toes no plan for intervention at this time is to improve clinically prior to elective AKA and continuing on wound care hydrotherapy".  10/17/2022: Patient seen (this is my first contact with the patient).  Patient seen on hemodialysis.  Patient is currently being diuresed, with goal UF of 1.5 L.  Patient has been given IV albumin 25 g with hemodialysis.  Systolic blood pressure is 111 mmHg.  Patient is not in any distress.  However, hemoglobin is noted to have dropped to 6.9 g/dL.  This is likely secondary to acute blood loss anemia and anemia of chronic kidney disease.  Will transfuse 1 unit of packed red blood cell with hemodialysis.  10/18/2022: Patient seen alongside patient's brother.  No new complaints.  Hemoglobin is 7.6 g/dL today.    Subjective: Patient seen. No new complaints. No chest pain no shortness of breath  Assessment and Plan: Active Problems:   Lower GI bleed   ESRD (end stage renal disease) on dialysis (HCC)   Malnutrition of moderate degree   Pseudoaneurysm of femoral artery (HCC)   Decreased  functional mobility  Pressure injury of skin   Colonic ulcer   Rectal arterial hemorrhage   Rectal ulcer   Hematochezia   Acute blood loss anemia   MRSA bacteremia    Acute blood loss anemia recurrent hematochezia from anal/rectal ulcer- Crohns' disease: Patient had acute blood loss anemia with recurrent hematochezia from anal/rectal ulcer status post multiple sigmoidoscopy-normal bleeding after EGD and oversewing of ulcers and packing and will schedule with [facility]-see chain of events above.  GI surgery signed off.  Continue budesonide 9 mg daily, per GI cont Canasa PR x 14 doses from 5/9. S/p multiple transfusion ~ 40 units, continue to monitor and transfuse for less than 7 g-overall appears stable although noted some blood rectally yesterday.  If continues to have bleeding reconsult GI. Recent Labs    10/15/22 2252 10/16/22 0849 10/17/22 0109 10/17/22 0931 10/18/22 0134  HGB 9.2* 8.8* 7.2* 6.9* 7.6*  MCV  --  98.9 98.3 100.0 98.0   10/17/2022: See above documentation.  Patient returns she is going to need of packed red blood cell.  Last hemoglobin was 6.9 g/dL.  Continue to monitor H/H.  Nursing staff reports rectal bleed overnight. 10/18/2022: Continue to monitor H&H.  Hemoglobin this morning was 7.6 g/dL.   Severe PVD Infected left femoral patch S/p ileofemoral bypass w/ sartorius muscle flap 4/3 Retroperitoneal hemorrhage Ischemic toes: Complicated vascular/surgical history -S/p left groin hematoma evacuation, primary repair of the distal external iliac artery. Further complicated on 4/3 by left groin bleeding with new pseudoaneurysm and arterial thrombus. S/p iliofemoral bypass and removal of infected patch with sartorius muscle flap. Having ongoing progressive ischemic changes to the toes> no plan for intervention at this time, plan is to improve clinically prior to elective AKA per VVS.Has wound vac on left groin.  See pictures below 10/18/2022: Will defer to the vascular  surgery team.  Patient will need left AKA when optimized.  MRSA/Enterococcus faecium bacteremia secondary to postop wound infection: Status post removal of infected patch (see above). seen by ID>4/8 recommending daptomycin for 6 weeks  last day 10/23/22, will need oral suppression later- so need to contact ID at that time.TEE is negative for vegetations.Monitor CPK weekly 10/18/2022:  Continue IV daptomycin.  Monitor CPK weekly.  Sacral pressure injury POA, unstageable/deep continue wound care, offloading bed mattress, position changing.  Continue hydrotherapy twice weekly per PT-due today, dosing with Dilaudid IV 0.5 mg prior to dressing change.  He had been on oxycodone RN concerned about confusion psot oxy- but daughter thinks it was OxyContin. I decrease the dose to 2.5 mg prn  if pain uncontrolled resume 5 mg prn. Cont tramadol prn . Continue to augment nutritional status for appropriate healing  ESRD on HD TTS, s/p short-term CRRT on 3/23 - 3/26, nephrology following, RIJ TC +.  AVG clotted 3/27 status post declot by VVS then clotted again> s/p IR TDC placed 4/1, having bruit in AVG 5/7.continue HD per nephrology on board  10/17/2022: Will defer to the nephrology team.  Patient is currently being dialyzed.  Will transfuse 1 unit of packed red blood cells with dialysis. 10/18/2022: Nephrology input is appreciated.  Patient is HD TTS.  Secondary hyperparathyroidism: Monitor calcium and Phos PTH.  Holding binders, on PhosLo per nephrology  Anemia ESRD and acute blood loss anemia see #1-continue Aranesp q Wednesday Recent Labs    10/08/22 0753 10/10/22 0020 10/11/22 0621 10/12/22 0605 10/13/22 0830 10/14/22 0112 10/15/22 0140 10/16/22 0119 10/17/22 0109 10/18/22 0134  BUN 94* 64* 57* 95* 129*  65* 106* 46* 95* 39*  CREATININE 5.99* 4.59* 4.39* 5.72* 7.23* 3.75* 5.34* 3.06* 5.04* 2.74*  CO2 22 26 26 25 24 27 25 25 26  27     Chronic systolic CHF EF 35-40% previously-TEE 4/1: EF 50-55%.   Severe LVH.  Address fluid with dialysis.Net IO Since Admission: 943.03 mL [10/18/22 1722]  10/17/2022:  Seems compensated at the moment.  Pulmonary hypertension-stable.  Hypertension: BP stable on Toprol 25 10/17/2022:  Stable.  IV albumin with HD.   Hyperlipidemia: LDL at goal 45 continue Crestor 10 mg  Acute metabolic encephalopathy: In the setting of uremia metabolic acidosis.  Has had extensive workup including MRI finding as below.  At times became sedated with opiates/pain medication.Continue supportive care fall precaution PT OT as tolerated delirium precaution. Weaning opiates.  Incidental lacunar stroke:4 mm acute infarct in the right subinsular white matter on MRI 09/04/2022: Patient had altered mental status and left leg weakness-MRI ordered incidental lacunar infarct, seen by neurology completed stroke workup echo EF 45% carotid Doppler bilateral ICA 40-59%, LDL 45 at goal, HbA1c 5.1.  PTA on Plavix.  At this time unable to use antiplatelet due to recurrent bleeding.  COPD: Respiratory status stable  Pain management/uncontrolled back pain: Currently on tylenol 650 mg q6hr (since 4/19-monitor LFTs), on dilaudid 1 mg> change to 0.5 iv q4hr prn, oxy 5-10 mg> cahnged to 02.5 mg 5/10, Cont tramadol 50 mg q6hr prn.I discussed extensively cont to wean down his opiates as able. 10/17/2022: Discontinue PRN Tramadol.  Moderate malnutrition Hypoalbuminemia: RD following augment nutritional status, Nutrition Problem: Moderate Malnutrition (in the context of chronic illness (ESRD)) Etiology: poor appetite Signs/Symptoms: moderate fat depletion, severe muscle depletion Interventions: Refer to RD note for recommendations, Liberalize Diet, Nepro shake, MVI   Pressure injuries beside sacrum on left thigh, left heel DTI, right thigh see below-continue wound care Pressure Injury 10/10/22 Thigh Left;Posterior;Proximal Stage 2 -  Partial thickness loss of dermis presenting as a shallow open injury  with a red, pink wound bed without slough. pink,skin pilled off  Pressure Injury 09/04/22 Heel Left Deep Tissue Pressure Injury - Purple or maroon localized area of discolored intact skin or blood-filled blister due to damage of underlying soft tissue from pressure and/or shear. Pressure Injury 10/10/22 Thigh Posterior;Proximal;Right Stage 2 -  Partial thickness loss of dermis presenting as a shallow open injury with a red, pink wound bed without slough. painful, bleeding (Active)  Goals of care remains full code palliative has been involved multiple times.  Daughter believes patient has poor understanding of what is going on requesting to have palliative evaluate again palliative reconsulted 5/10- can be seen over the weekend  DVT prophylaxis: SCD's Start: 09/09/22 1841 Code Status:   Code Status: DNR Family Communication: plan of care discussed with patient/daughter at bedside. Patient status is:  inpatient  because of deep sacral ulcer, left ischemic toes Level of care: Progressive   Dispo: The patient is from: home            Anticipated disposition: TBD.  Brought up the idea of LTAC with patient and daughter, [facility] consulted.  Objective: Vitals last 24 hrs: Vitals:   10/17/22 1630 10/17/22 1705 10/17/22 1954 10/18/22 0300  BP: (!) 160/94 (!) 159/77 133/67 (!) 148/64  Pulse: 90 94 87 62  Resp: 20 20 18 20   Temp: 98.1 F (36.7 C)  98.5 F (36.9 C) 98.4 F (36.9 C)  TempSrc: Oral  Oral Oral  SpO2: 97% 97% 100% 97%  Weight:  68 kg  Height:       Weight change: 0 kg  Physical Examination: General exam: Patient is chronically ill looking.  AAO x 3.  Black/discoloration of the left third, fourth and fifth toes. HEENT: Patient is pale.  No jaundice. Respiratory system: Clear to auscultation.   Cardiovascular system: S1 S2. Gastrointestinal system: Soft and nontender. Nervous System:  Alert, awake.  Patient cannot move the left lower extremity  Extremities: No ankle edema. Skin:  Dark discoloration/black left third, fourth and fifth toes.  Left ground wound vac Sacral decubitus dressing+ TDC+ on left chest Rt arm w/ AVG w.  Fluid thrill.     Medications reviewed:  Scheduled Meds:  (feeding supplement) PROSource Plus  30 mL Oral TID WC   acetaminophen  650 mg Oral Q6H   ascorbic acid  250 mg Oral BID   budesonide  9 mg Oral Daily   calcium acetate  667 mg Oral TID WC   Chlorhexidine Gluconate Cloth  6 each Topical Q0600   Chlorhexidine Gluconate Cloth  6 each Topical Q0600   collagenase   Topical Daily   darbepoetin (ARANESP) injection - DIALYSIS  200 mcg Subcutaneous Q Wed-1800   liver oil-zinc oxide   Topical TID   mesalamine  1,000 mg Rectal QHS   methocarbamol  500 mg Oral TID   metoprolol succinate  25 mg Oral Daily   multivitamin  1 tablet Oral QHS   nutrition supplement (JUVEN)  1 packet Oral BID BM   pantoprazole  40 mg Oral BID   polyethylene glycol  17 g Oral Daily   rosuvastatin  10 mg Oral Daily   sodium chloride flush  10-40 mL Intracatheter Q12H   vitamin A  10,000 Units Oral Daily   Continuous Infusions:  sodium chloride 0 mL/hr at 09/22/22 0757   DAPTOmycin (CUBICIN) 600 mg in sodium chloride 0.9 % IVPB 600 mg (10/15/22 1633)   DAPTOmycin (CUBICIN) 750 mg in sodium chloride 0.9 % IVPB 750 mg (10/17/22 1810)   sodium chloride     Diet Order             Diet regular Room service appropriate? Yes; Fluid consistency: Thin; Fluid restriction: 1200 mL Fluid  Diet effective now                   Intake/Output Summary (Last 24 hours) at 10/18/2022 1722 Last data filed at 10/17/2022 2000 Gross per 24 hour  Intake 120 ml  Output 0 ml  Net 120 ml    Net IO Since Admission: 943.03 mL [10/18/22 1722]  Wt Readings from Last 3 Encounters:  10/18/22 68 kg  08/23/22 64.1 kg  08/15/22 64.9 kg     Unresulted Labs (From admission, onward)     Start     Ordered   10/15/22 0500  Basic metabolic panel  Daily,   R     Question:   Specimen collection method  Answer:  Unit=Unit collect   10/14/22 1054   10/15/22 0500  CBC  Daily,   R     Question:  Specimen collection method  Answer:  Unit=Unit collect   10/14/22 1054   09/28/22 0500  CK  Every Monday,   R (with TIMED occurrences)     Question:  Specimen collection method  Answer:  Lab=Lab collect   09/21/22 1130          Data Reviewed: I have personally reviewed following labs and imaging studies CBC: Recent Labs  Lab 10/15/22 0140 10/15/22 2252 10/16/22 0849 10/17/22 0109 10/17/22 0931 10/18/22 0134  WBC 14.5*  --  14.0* 16.7* 17.4* 16.0*  NEUTROABS  --   --   --   --   --  14.2*  HGB 7.7* 9.2* 8.8* 7.2* 6.9* 7.6*  HCT 24.4* 29.9* 28.2* 23.8* 22.9* 23.9*  MCV 98.4  --  98.9 98.3 100.0 98.0  PLT 273  --  255 276 303 227    Basic Metabolic Panel: Recent Labs  Lab 10/12/22 0605 10/13/22 0830 10/14/22 0112 10/15/22 0140 10/16/22 0119 10/17/22 0109 10/18/22 0134  NA 137 135 135 134* 134* 134* 134*  K 4.8 5.6* 4.7 4.7 4.0 5.2* 3.5  CL 95* 95* 92* 96* 95* 94* 93*  CO2 25 24 27 25 25 26 27   GLUCOSE 94 95 111* 119* 84 116* 96  BUN 95* 129* 65* 106* 46* 95* 39*  CREATININE 5.72* 7.23* 3.75* 5.34* 3.06* 5.04* 2.74*  CALCIUM 9.3 9.2 9.3 8.8* 8.1* 8.9 8.6*  MG  --   --   --   --   --   --  1.7  PHOS 4.3 4.4  --   --   --   --  3.1    GFR: Estimated Creatinine Clearance: 24.8 mL/min (A) (by C-G formula based on SCr of 2.74 mg/dL (H)). Liver Function Tests: Recent Labs  Lab 10/12/22 0605 10/13/22 0830 10/14/22 0112 10/18/22 0134  AST  --   --  18  --   ALT  --   --  16  --   ALKPHOS  --   --  111  --   BILITOT  --   --  1.1  --   PROT  --   --  5.5*  --   ALBUMIN 1.8* 1.8* 1.7* 2.0*   No results found for this or any previous visit (from the past 240 hour(s)).  Antimicrobials: Anti-infectives (From admission, onward)    Start     Dose/Rate Route Frequency Ordered Stop   09/27/22 1200  DAPTOmycin (CUBICIN) 750 mg in sodium chloride  0.9 % IVPB  Status:  Discontinued        750 mg 130 mL/hr over 30 Minutes Intravenous Daily-1800 09/26/22 1733 09/27/22 0924   09/27/22 1200  DAPTOmycin (CUBICIN) 750 mg in sodium chloride 0.9 % IVPB        750 mg 130 mL/hr over 30 Minutes Intravenous  Once 09/27/22 0923 09/27/22 1500   09/26/22 1800  DAPTOmycin (CUBICIN) 750 mg in sodium chloride 0.9 % IVPB        10 mg/kg  75.2 kg 130 mL/hr over 30 Minutes Intravenous Every Sat (1800) 09/24/22 1342 10/23/22 2359   09/24/22 1800  DAPTOmycin (CUBICIN) 600 mg in sodium chloride 0.9 % IVPB        8 mg/kg  75.2 kg 124 mL/hr over 30 Minutes Intravenous Once per day on Tue Thu 09/24/22 1342 10/23/22 2359   09/19/22 1800  DAPTOmycin (CUBICIN) 750 mg in sodium chloride 0.9 % IVPB  Status:  Discontinued        10 mg/kg  75.2 kg 130 mL/hr over 30 Minutes Intravenous Every Sat (1800) 09/14/22 1622 09/24/22 1342   09/16/22 0830  DAPTOmycin (CUBICIN) 600 mg in sodium chloride 0.9 % IVPB  Status:  Discontinued        8 mg/kg  74.8 kg 124 mL/hr over 30 Minutes Intravenous  Once 09/16/22 0738 09/16/22 1555   09/15/22 1800  DAPTOmycin (CUBICIN)  600 mg in sodium chloride 0.9 % IVPB  Status:  Discontinued        8 mg/kg  75.2 kg 124 mL/hr over 30 Minutes Intravenous Once per day on Tue Thu 09/14/22 1622 09/24/22 1342   09/12/22 1800  DAPTOmycin (CUBICIN) 600 mg in sodium chloride 0.9 % IVPB        600 mg 124 mL/hr over 30 Minutes Intravenous  Once 09/12/22 1049 09/12/22 2033   09/11/22 1615  DAPTOmycin (CUBICIN) 600 mg in sodium chloride 0.9 % IVPB        600 mg 124 mL/hr over 30 Minutes Intravenous  Once 09/11/22 1522 09/11/22 2042   09/10/22 1200  vancomycin (VANCOREADY) IVPB 500 mg/100 mL  Status:  Discontinued        500 mg 100 mL/hr over 60 Minutes Intravenous Every T-Th-Sa (Hemodialysis) 09/09/22 0719 09/10/22 0719   09/10/22 0815  vancomycin (VANCOREADY) IVPB 750 mg/150 mL        750 mg 150 mL/hr over 60 Minutes Intravenous  Once 09/10/22  0719 09/10/22 0939   09/10/22 0718  vancomycin variable dose per unstable renal function (pharmacist dosing)  Status:  Discontinued         Does not apply See admin instructions 09/10/22 0719 09/11/22 1522   09/09/22 2000  piperacillin-tazobactam (ZOSYN) IVPB 2.25 g  Status:  Discontinued        2.25 g 100 mL/hr over 30 Minutes Intravenous Every 8 hours 09/09/22 1849 09/10/22 1003   09/09/22 1145  ceFAZolin (ANCEF) 2-4 GM/100ML-% IVPB       Note to Pharmacy: Payton Emerald A: cabinet override      09/09/22 1145 09/09/22 2359   09/08/22 0830  vancomycin (VANCOREADY) IVPB 750 mg/150 mL        750 mg 150 mL/hr over 60 Minutes Intravenous  Once 09/08/22 0743 09/08/22 1004   09/07/22 1200  ceFAZolin (ANCEF) IVPB 2g/100 mL premix       Note to Pharmacy: Give in IR for surgical prophylaxis   2 g 200 mL/hr over 30 Minutes Intravenous  Once 09/07/22 1100 09/07/22 1327   09/07/22 1101  ceFAZolin (ANCEF) 2-4 GM/100ML-% IVPB       Note to Pharmacy: Phebe Colla N: cabinet override      09/07/22 1101 09/07/22 1335   09/07/22 0734  vancomycin variable dose per unstable renal function (pharmacist dosing)  Status:  Discontinued         Does not apply See admin instructions 09/07/22 0734 09/09/22 0718   09/05/22 1600  vancomycin (VANCOREADY) IVPB 750 mg/150 mL        750 mg 150 mL/hr over 60 Minutes Intravenous  Once 09/05/22 1505 09/05/22 1717   09/02/22 1227  ceFAZolin (ANCEF) 2-4 GM/100ML-% IVPB       Note to Pharmacy: Isabel Caprice, Destiny: cabinet override      09/02/22 1227 09/03/22 0044   09/02/22 0520  vancomycin variable dose per unstable renal function (pharmacist dosing)  Status:  Discontinued         Does not apply See admin instructions 09/02/22 0521 09/05/22 0940   08/31/22 1800  vancomycin (VANCOREADY) IVPB 750 mg/150 mL  Status:  Discontinued        750 mg 150 mL/hr over 60 Minutes Intravenous Every 24 hours 08/31/22 0822 09/02/22 0521   08/30/22 1800  vancomycin (VANCOREADY) IVPB 1500  mg/300 mL        1,500 mg 150 mL/hr over 120 Minutes Intravenous STAT 08/30/22 1739 08/30/22 2139  Culture/Microbiology    Component Value Date/Time   SDES TISSUE 09/09/2022 1453   SPECREQUEST VESSEL PT ON VANC ANCEF 09/09/2022 1453   CULT  09/09/2022 1453    RARE STAPHYLOCOCCUS AUREUS SUSCEPTIBILITIES PERFORMED ON PREVIOUS CULTURE WITHIN THE LAST 5 DAYS. NO ANAEROBES ISOLATED Performed at [facility], [address] [place], [place] [address]    REPTSTATUS 09/14/2022 FINAL 09/09/2022 1453   Other culture-see note  Radiology Studies: No results found.   LOS: 51 days   Time spent: 35 minutes.  Barnetta Chapel, MD [facility]  10/18/2022, 5:22 PM

## 2022-10-18 NOTE — Progress Notes (Signed)
[facility] Progress Note   Subjective:   Seen in room - looks stable. Got IV albumin with HD yesterday for hypotension, seemed to help. Np CP/dyspnea today.   Objective Vitals:   10/17/22 1630 10/17/22 1705 10/17/22 1954 10/18/22 0300  BP: (!) 160/94 (!) 159/77 133/67 (!) 148/64  Pulse: 90 94 87 62  Resp: 20 20 18 20   Temp: 98.1 F (36.7 C)  98.5 F (36.9 C) 98.4 F (36.9 C)  TempSrc: Oral  Oral Oral  SpO2: 97% 97% 100% 97%  Weight:    68 kg  Height:       Physical Exam General: Chronically ill and frail man. NAD. Room air. Heart: RRR; no murmur Lungs: CTA anteriorly Abdomen: soft Extremities: No LE edema; ischemic L toes Dialysis Access: TDC, L forearm AVG + bruit  Additional Objective Labs: Basic Metabolic Panel: Recent Labs  Lab 10/12/22 0605 10/13/22 0830 10/14/22 0112 10/16/22 0119 10/17/22 0109 10/18/22 0134  NA 137 135   < > 134* 134* 134*  K 4.8 5.6*   < > 4.0 5.2* 3.5  CL 95* 95*   < > 95* 94* 93*  CO2 25 24   < > 25 26 27   GLUCOSE 94 95   < > 84 116* 96  BUN 95* 129*   < > 46* 95* 39*  CREATININE 5.72* 7.23*   < > 3.06* 5.04* 2.74*  CALCIUM 9.3 9.2   < > 8.1* 8.9 8.6*  PHOS 4.3 4.4  --   --   --  3.1   < > = values in this interval not displayed.   Liver Function Tests: Recent Labs  Lab 10/13/22 0830 10/14/22 0112 10/18/22 0134  AST  --  18  --   ALT  --  16  --   ALKPHOS  --  111  --   BILITOT  --  1.1  --   PROT  --  5.5*  --   ALBUMIN 1.8* 1.7* 2.0*   CBC: Recent Labs  Lab 10/15/22 0140 10/15/22 2252 10/16/22 0849 10/17/22 0109 10/17/22 0931 10/18/22 0134  WBC 14.5*  --  14.0* 16.7* 17.4* 16.0*  NEUTROABS  --   --   --   --   --  14.2*  HGB 7.7*   < > 8.8* 7.2* 6.9* 7.6*  HCT 24.4*   < > 28.2* 23.8* 22.9* 23.9*  MCV 98.4  --  98.9 98.3 100.0 98.0  PLT 273  --  255 276 303 227   < > = values in this interval not displayed.   Medications:  sodium chloride 0 mL/hr at 09/22/22 0757   DAPTOmycin (CUBICIN) 600  mg in sodium chloride 0.9 % IVPB 600 mg (10/15/22 1633)   DAPTOmycin (CUBICIN) 750 mg in sodium chloride 0.9 % IVPB 750 mg (10/17/22 1810)   sodium chloride      (feeding supplement) PROSource Plus  30 mL Oral TID WC   acetaminophen  650 mg Oral Q6H   ascorbic acid  250 mg Oral BID   budesonide  9 mg Oral Daily   calcium acetate  667 mg Oral TID WC   Chlorhexidine Gluconate Cloth  6 each Topical Q0600   Chlorhexidine Gluconate Cloth  6 each Topical Q0600   collagenase   Topical Daily   darbepoetin (ARANESP) injection - DIALYSIS  200 mcg Subcutaneous Q Wed-1800   liver oil-zinc oxide   Topical TID   mesalamine  1,000 mg Rectal QHS  methocarbamol  500 mg Oral TID   metoprolol succinate  25 mg Oral Daily   multivitamin  1 tablet Oral QHS   nutrition supplement (JUVEN)  1 packet Oral BID BM   pantoprazole  40 mg Oral BID   polyethylene glycol  17 g Oral Daily   rosuvastatin  10 mg Oral Daily   sodium chloride flush  10-40 mL Intracatheter Q12H   vitamin A  10,000 Units Oral Daily    Dialysis Orders: TTS SW  4h   350/800  64.5kg  new TDC (RFA AVG clotted)  Heparin none  - venofer 50mg  IV weekly - rocaltrol 0.25 mcg po tiw - phoslo 2 ac tid - no esa, last Hb 11.0   Assessment/Plan: Recurrent lower GIB: S/p colonoscopy 09/15/22, then repeat sigmoidoscopy 4/11. Several bleeding ulcers addressed.  Path came back + Crohn's - started on mesalamine by GI. Brisk re-bleed 4/18, took again for emergent sigmoidoscopy with hemostatic gel applied to bleeding ulcer. Then bled again requiring suturing of bleeding ulcers 4/20 by Dr. Magnus Ivan. Hgb 8.8 -> 6.9 on 5/11 - given another 1U blood. 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 S/p iliofemoral bypass, removal of infected patch with sartorius muscle flap 09/09/22. Ischemic changes to L 3rd-5th toes persist. 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 Sacral wound: Non-healing. 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 CVA on MRI. Neuro signed off.  ESRD: S/p short-term CRRT 3/23- 3/26, now back on iHD on TTS schedule. Next HD 5/14 using TDC.  BP/volume: No LE edema, below prior EDW. Anemia (ESRD + ABLA): Last transfused 1U on 5/11 (total of 26U this admit). Also on Aranesp q Wed (max dose).  Secondary HPTH: CorrCa high, phos at goal. Holding VDRA, Phoslo dose has been reduced. Dialysis access: AVG clotted 3/27, went for declot by VVS and then re-clotted. VVS placed R TDC on 4/1 but there is a strong bruit in the AVG 5/7 - attempted and unable to cannulate on 5/9. Use catheter for now. Hypoalbuminemia - Alb 1.7, in hospital > 40 days. On supplements. Goals of care: Palliative care has revisited, see their notes. Pt previously had not been responsive to suggestions of hospice. Finally changed to DNR as of 5/11, still desiring HD and other full scope for now but family is considering.  Ozzie Hoyle, PA-C 10/18/2022, 9:00 AM  [employer]

## 2022-10-19 LAB — BASIC METABOLIC PANEL
Anion gap: 17 — ABNORMAL HIGH (ref 5–15)
BUN: 66 mg/dL — ABNORMAL HIGH (ref 8–23)
CO2: 24 mmol/L (ref 22–32)
Calcium: 8.7 mg/dL — ABNORMAL LOW (ref 8.9–10.3)
Chloride: 92 mmol/L — ABNORMAL LOW (ref 98–111)
Creatinine, Ser: 4.82 mg/dL — ABNORMAL HIGH (ref 0.61–1.24)
GFR, Estimated: 12 mL/min — ABNORMAL LOW (ref >60)
Glucose, Bld: 97 mg/dL (ref 70–99)
Potassium: 3.9 mmol/L (ref 3.5–5.1)
Sodium: 133 mmol/L — ABNORMAL LOW (ref 135–145)

## 2022-10-19 LAB — CBC
HCT: 22.5 % — ABNORMAL LOW (ref 39.0–52.0)
Hemoglobin: 7.2 g/dL — ABNORMAL LOW (ref 13.0–17.0)
MCH: 31.7 pg (ref 26.0–34.0)
MCHC: 32 g/dL (ref 30.0–36.0)
MCV: 99.1 fL (ref 80.0–100.0)
Platelets: 261 10*3/uL (ref 150–400)
RBC: 2.27 MIL/uL — ABNORMAL LOW (ref 4.22–5.81)
RDW: 18.2 % — ABNORMAL HIGH (ref 11.5–15.5)
WBC: 19.6 10*3/uL — ABNORMAL HIGH (ref 4.0–10.5)
nRBC: 0 % (ref 0.0–0.2)

## 2022-10-19 LAB — CK: Total CK: 22 U/L — ABNORMAL LOW (ref 49–397)

## 2022-10-19 MED ORDER — PENTAFLUOROPROP-TETRAFLUOROETH EX AERO: 1.0000 | INHALATION_SPRAY | CUTANEOUS | Status: DC | PRN

## 2022-10-19 MED ORDER — HEPARIN SODIUM (PORCINE) 1000 UNIT/ML DIALYSIS
1000.0000 [IU] | INTRAMUSCULAR | Status: DC | PRN
  Filled 2022-10-19: qty 1

## 2022-10-19 MED ORDER — ALTEPLASE 2 MG IJ SOLR
2.0000 mg | Freq: Once | INTRAMUSCULAR | Status: DC | PRN
  Filled 2022-10-19: qty 2

## 2022-10-19 NOTE — Progress Notes (Signed)
Pharmacy Antibiotic Note  Albert Harris is a 67 y.o. 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 Now with MRSA bacteremia. Noted patient is ESRD HD TTS PTA, with a right arm AV graft and a catheter in his right IJ. Pharmacy consulted to dose daptomycin due to enterococcus faecium and staph aureus growing from tissue cultures from 4/3.  -HD on TTS schedule  -CK today= 22. End date for therapy is 5/17.   Plan: Continue Daptomycin 600mg  (8 mg/kg) IV post HD on Tues/Thurs and 750 mg (10 m/gk) IV post HD on Saturdays Monitor weekly CK, follow HD schedule Discharge orders pended   Height: 5' 7.99" (172.7 cm) Weight: 60.5 kg (133 lb 6.1 oz) IBW/kg (Calculated) : 68.38  Temp (24hrs), Avg:98.3 F (36.8 C), Min:98.2 F (36.8 C), Max:98.4 F (36.9 C)  Recent Labs  Lab 10/15/22 0140 10/16/22 0119 10/16/22 0849 10/17/22 0109 10/17/22 0931 10/18/22 0134 10/19/22 0550  WBC 14.5*  --  14.0* 16.7* 17.4* 16.0* 19.6*  CREATININE 5.34* 3.06*  --  5.04*  --  2.74* 4.82*     Estimated Creatinine Clearance: 12.6 mL/min (A) (by C-G formula based on SCr of 4.82 mg/dL (H)).    Allergies  Allergen Reactions   Benadryl [Diphenhydramine] Rash   Lidocaine-Prilocaine Rash    Caused red marks up and down arm   Antimicrobials this admission:  Cefazolin 3/23 x 1 Vancomycin 3/24 >> 4/5 Zosyn 4/3 >>4/3 Daptomycin 4/5>>5/17  CK 4/8 57 4/16 25 4/22 18 4/29 CK 45  Microbiology results:  4/3 L groin wound: MSRA + VRE 4/3 MRSA PCR: neg 3/27 Bcx: neg 3/25 Bcx: MRSA 3/23 Bcx: MRSA   Albert Harris, PharmD, BCIDP, AAHIVP, CPP Infectious Disease Pharmacist 10/19/2022 9:37 AM

## 2022-10-19 NOTE — Progress Notes (Signed)
[facility] Progress Note   Subjective:    Seen and examined patient at bedside. Patient's brother also at bedside. He reports cramping left leg-it appears to be around the wound vac site. Next HD 5/14.  Objective Vitals:   10/18/22 2326 10/19/22 0300 10/19/22 0439 10/19/22 0931  BP: (!) 156/73 125/82  134/69  Pulse: 88 85  87  Resp: 14 20  20   Temp:  98.4 F (36.9 C)  98.2 F (36.8 C)  TempSrc:  Oral  Oral  SpO2: 93% 97%  100%  Weight:   60.5 kg   Height:       Physical Exam General: Chronically ill and frail man. NAD. Room air. Heart: RRR; no murmur Lungs: CTA anteriorly Abdomen: soft Extremities: No LE edema; ischemic/gangrene L toes; wound vac noted L groin Dialysis Access: TDC, L forearm AVG + bruit  Filed Weights   10/17/22 1352 10/18/22 0300 10/19/22 0439  Weight: 63 kg 68 kg 60.5 kg    Intake/Output Summary (Last 24 hours) at 10/19/2022 1136 Last data filed at 10/19/2022 0505 Gross per 24 hour  Intake 250 ml  Output 0 ml  Net 250 ml    Additional Objective Labs: Basic Metabolic Panel: Recent Labs  Lab 10/13/22 0830 10/14/22 0112 10/17/22 0109 10/18/22 0134 10/19/22 0550  NA 135   < > 134* 134* 133*  K 5.6*   < > 5.2* 3.5 3.9  CL 95*   < > 94* 93* 92*  CO2 24   < > 26 27 24   GLUCOSE 95   < > 116* 96 97  BUN 129*   < > 95* 39* 66*  CREATININE 7.23*   < > 5.04* 2.74* 4.82*  CALCIUM 9.2   < > 8.9 8.6* 8.7*  PHOS 4.4  --   --  3.1  --    < > = values in this interval not displayed.   Liver Function Tests: Recent Labs  Lab 10/13/22 0830 10/14/22 0112 10/18/22 0134  AST  --  18  --   ALT  --  16  --   ALKPHOS  --  111  --   BILITOT  --  1.1  --   PROT  --  5.5*  --   ALBUMIN 1.8* 1.7* 2.0*   No results for input(s): "LIPASE", "AMYLASE" in the last 168 hours. CBC: Recent Labs  Lab 10/16/22 0849 10/17/22 0109 10/17/22 0931 10/18/22 0134 10/19/22 0550  WBC 14.0* 16.7* 17.4* 16.0* 19.6*  NEUTROABS  --   --   --  14.2*   --   HGB 8.8* 7.2* 6.9* 7.6* 7.2*  HCT 28.2* 23.8* 22.9* 23.9* 22.5*  MCV 98.9 98.3 100.0 98.0 99.1  PLT 255 276 303 227 261   Blood Culture    Component Value Date/Time   SDES TISSUE 09/09/2022 1453   SPECREQUEST VESSEL PT ON VANC ANCEF 09/09/2022 1453   CULT  09/09/2022 1453    RARE STAPHYLOCOCCUS AUREUS SUSCEPTIBILITIES PERFORMED ON PREVIOUS CULTURE WITHIN THE LAST 5 DAYS. NO ANAEROBES ISOLATED Performed at [facility], [address] [place], [place] [address]    REPTSTATUS 09/14/2022 FINAL 09/09/2022 1453    Cardiac Enzymes: Recent Labs  Lab 10/19/22 0550  CKTOTAL 22*   CBG: No results for input(s): "GLUCAP" in the last 168 hours. Iron Studies: No results for input(s): "IRON", "TIBC", "TRANSFERRIN", "FERRITIN" in the last 72 hours. Lab Results  Component Value Date   INR 1.2 08/12/2022   INR  1.3 (H) 06/29/2022   INR 1.3 (H) 06/28/2022   Studies/Results: No results found.  Medications:  sodium chloride 0 mL/hr at 09/22/22 0757   DAPTOmycin (CUBICIN) 600 mg in sodium chloride 0.9 % IVPB 600 mg (10/15/22 1633)   DAPTOmycin (CUBICIN) 750 mg in sodium chloride 0.9 % IVPB 750 mg (10/17/22 1810)   sodium chloride      (feeding supplement) PROSource Plus  30 mL Oral TID WC   acetaminophen  650 mg Oral Q6H   ascorbic acid  250 mg Oral BID   budesonide  9 mg Oral Daily   calcium acetate  667 mg Oral TID WC   Chlorhexidine Gluconate Cloth  6 each Topical Q0600   Chlorhexidine Gluconate Cloth  6 each Topical Q0600   collagenase   Topical Daily   darbepoetin (ARANESP) injection - DIALYSIS  200 mcg Subcutaneous Q Wed-1800   liver oil-zinc oxide   Topical TID   mesalamine  1,000 mg Rectal QHS   methocarbamol  500 mg Oral TID   metoprolol succinate  25 mg Oral Daily   multivitamin  1 tablet Oral QHS   nutrition supplement (JUVEN)  1 packet Oral BID BM   pantoprazole  40 mg Oral BID   polyethylene glycol  17 g Oral Daily   rosuvastatin  10 mg Oral Daily    sodium chloride flush  10-40 mL Intracatheter Q12H   vitamin A  10,000 Units Oral Daily    Dialysis Orders: SW TTS 4h   350/800  64.5kg  new TDC (RFA AVG clotted)  Heparin none  - venofer 50mg  IV weekly - rocaltrol 0.25 mcg po tiw - phoslo 2 ac tid - no esa, last Hb 11.0  Assessment/Plan: Recurrent lower GIB: S/p colonoscopy 09/15/22, then repeat sigmoidoscopy 4/11. Several bleeding ulcers addressed.  Path came back + Crohn's - started on mesalamine by GI. Brisk re-bleed 4/18, took again for emergent sigmoidoscopy with hemostatic gel applied to bleeding ulcer. Then bled again requiring suturing of bleeding ulcers 4/20 by Dr. Magnus Ivan. Hgb 8.8 -> 6.9 on 5/11 - given another 1U blood. 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 S/p iliofemoral bypass, removal of infected patch with sartorius muscle flap 09/09/22. Ischemic changes to L 3rd-5th toes persist. 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 Sacral wound: Non-healing. 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 CVA on MRI. Neuro signed off.  ESRD: S/p short-term CRRT 3/23- 3/26, now back on iHD on TTS schedule. Next HD 5/14 using TDC.  BP/volume: No LE edema, below prior EDW. Anemia (ESRD + ABLA): Last transfused 1U on 5/11 (total of 26U this admit). Also on Aranesp q Wed (max dose).  Secondary HPTH: CorrCa high, phos at goal. Holding VDRA, Phoslo dose has been reduced. Dialysis access: AVG clotted 3/27, went for declot by VVS and then re-clotted. VVS placed R TDC on 4/1 but there is a strong bruit in the AVG 5/7 - attempted and unable to cannulate on 5/9. Use catheter for now. Hypoalbuminemia - Alb 1.7, in hospital > 40 days. On supplements. Goals of care: Palliative care has revisited, see their notes. Pt previously had not been responsive to suggestions of hospice. Finally changed to  DNR as of 5/11, still desiring HD and other full scope for now but family is considering.  Salome Holmes, NP [facility] 10/19/2022,11:36 AM  LOS:  52 days

## 2022-10-19 NOTE — Progress Notes (Signed)
PROGRESS NOTE    Albert Harris  AVW:[id number] DOB: [birth year] DOA: 08/28/2022 PCP: [name], NP    Brief Narrative:  Patient is a 67 years old patient with past medical history of ESRD on HD T/Thursday, Saturday, peripheral vascular disease, COPD, essential hypertension, chronic systolic congestive heart failure with ejection fraction 35 to 40%, initially presented to the hospital with with abdominal pain, nausea and diaphoresis. Patient was initially hypotensive with a lactic acidosis and was admitted to ICU,with a pulseless left lower extremity was found to have femoral pseudoaneurysm. Blood culture came back with MRSA bacteremia. TEE neg for vegetations.  Patient had prolonged and complicated hospitalization course as below.  08/29/22: Admitted to ICU for septic shock. s/p OR with Dr. Karin Lieu for left femoral exploration with pseudoaneurysm repair,hematoma evacuation>showed dehiscence at proximal aspect of bovine pericardial patch at the suture line. Emergent CRRT for severe hyperkalemia  09/08/22 Transferred to [facility] service   09/09/22 CTA for bleeding on left groin: showed Thrombosis of LEFT CFA pseudoaneurysm with new 3.5 cm filling defect, consistent with arterial thrombus, extending from the PSA neck and into the distal CFA. This is at risk for distal embolization and potential acute LEFT lower extremity ischemia. New 8 mm distal LEFT EIA pseudoaneurysm, medial to the stent distal landing zone. Mild interval enlargement of retroperitoneal hematoma, in a craniocaudal dimension measuring 12.2 cm (previously 11.2 cm).No CTA evidence of active extravasation. Patent RIGHT femoral-to-popliteal graft and bilateral iliac stents.Severe burden of splanchnic arterial atherosclerosis, at risk for chronic mesenteric ischemia.  09/09/22: urgent OR:S/P left common iliac to profunda femoris bypass with ipsilateral nonreversed greater saphenous vein in anatomic tunnel, left common iliac artery  angioplasty and stenting, left greater saphenous vein harvest, left sartorius muscle of left groin, left lower extremity angiography with first-order cannulation  09/12/22 am: 2 BRBPR> s/p Flex sig>showed 5 rectal ulcers, not actively bleeding, advised to avoid constipation, added Miralax BID  09/13/22 AM: Had 2 large bloody BM ~ 500cc> got 1 unit PRBC.  4/11 repeat sigmoidoscopy-several bleeding ulcers, pathology came back positive for Crohn's and started on mesalamine,   4/18: massive rebleeding-not a candidate for IR due to severe PVD s/p emergent sigmoidoscopy with hemostatic gel applied to bleeding ulcer  09/26/22: s/p suturing of bleeding ulcers by Dr. Magnus Ivan in OR.  10/13/22: Patient having progressive ischemic changes to the toes no plan for intervention at this time is to improve clinically prior to elective AKA and continuing on wound care hydrotherapy.  Palliative care was consulted during hospitalization about goals of care and at this time is a full scope.  Assessment and Plan:   Acute blood loss anemia Recurrent hematochezia from anal/rectal ulcer- Crohns' disease: Status post multiple sigmoidoscopy, EGD and oversewing of ulcers and packing and will schedule with [facility].  GI and general surgery has signed off.  Continue budesonide 9 mg daily, per GI cont [place]asa PR x 14 doses from 5/9. S/p multiple transfusion ~ 40 units, continue to monitor and transfuse for less than 7 g.  Patient did have 1 episode of bleeding yesterday.  Hemoglobin of 7.2 at this time.  Palliative care on board and patient is a very poor candidate for ongoing intervention.     Latest Ref Rng & Units 10/19/2022    5:50 AM 10/18/2022    1:34 AM 10/17/2022    9:31 AM  CBC  WBC 4.0 - 10.5 K/uL 19.6  16.0  17.4   Hemoglobin 13.0 - 17.0 g/dL 7.2  7.6  6.9   Hematocrit 39.0 - 52.0 % 22.5  23.9  22.9   Platelets 150 - 400 K/uL 261  227  303      Severe PVD Infected left femoral patch S/p ileofemoral bypass w/  sartorius muscle flap 4/3 Retroperitoneal hemorrhage Ischemic toes: Patient does have complicated vascular/surgical history -S/p left groin hematoma evacuation, primary repair of the distal external iliac artery. Further complicated on 4/3 by left groin bleeding with new pseudoaneurysm and arterial thrombus. S/p iliofemoral bypass and removal of infected patch with sartorius muscle flap. Having ongoing progressive ischemic changes to the toes> no plan for intervention at this time, plan is to improve clinically prior to elective AKA per vascular surgery.Has wound vac on left groin.  Will need left AKA when optimized.  MRSA/Enterococcus faecium bacteremia secondary to postop wound infection: Status post removal of infected patch.  Seen by ID>4/8 recommending daptomycin for 6 weeks  last day 10/23/22, will need oral suppression later- so need to contact ID at that time.TEE is negative for vegetations.Monitor CPK weekly   Sacral pressure injury POA, unstageable/deep continue wound care, offloading bed mattress, position changing.  Continue hydrotherapy twice weekly pain management.  ESRD on HD TTS, s/p short-term CRRT on 3/23 - 3/26, nephrology following, RIJ TC +.  AVG clotted 3/27 status post declot by VVS then clotted again> s/p IR TDC placed 4/1, having bruit in AVG 5/7.continue HD per nephrology on board.   Secondary hyperparathyroidism: Monitor calcium and Phos PTH.  Hold binders and PhosLo.  Anemia ESRD and acute blood loss anemia  Hemoglobin of 7.2 at this time.  Will continue to monitor.  Chronic systolic CHF EF 35-40% TEE 4/1: EF 50-55%.  Severe LVH.  Compensated at this time.  Continue to monitor intake and output charting.'  Pulmonary hypertension-stable.   Hypertension: Normal.  Blood pressure stable at this time.   Hyperlipidemia: Continue Crestor   Acute metabolic encephalopathy:  In the context of uremia and metabolic acidosis and opiates.    Incidental lacunar stroke:4 mm  acute infarct in the right subinsular white matter on MRI 09/04/2022. Neurology completed stroke workup echo EF 45% carotid Doppler bilateral ICA 40-59%, LDL 45 at goal, HbA1c 5.1.  PTA on Plavix.  At this time unable to use antiplatelet due to recurrent bleeding.   COPD: Continue bronchodilators.   Pain management/uncontrolled back pain: On Tylenol Dilaudid tramadol   Moderate malnutrition/Hypoalbuminemia: Nutrition Status: Nutrition Problem: Moderate Malnutrition (in the context of chronic illness (ESRD)) Etiology: poor appetite Signs/Symptoms: moderate fat depletion, severe muscle depletion Interventions: Refer to RD note for recommendations, Liberalize Diet, Nepro shake, MVI      Pressure injuries  Present on admission.  Continue wound care. Pressure Injury 08/28/22 Sacrum Medial Unstageable - Full thickness tissue loss in which the base of the injury is covered by slough (yellow, tan, gray, green or brown) and/or eschar (tan, brown or black) in the wound bed. Sacral wound with eschar coveri (Active)  08/28/22 2228  Location: Sacrum  Location Orientation: Medial  Staging: Unstageable - Full thickness tissue loss in which the base of the injury is covered by slough (yellow, tan, gray, green or brown) and/or eschar (tan, brown or black) in the wound bed.  Wound Description (Comments): Sacral wound with eschar covering wound bed  Present on Admission: Yes     Pressure Injury 09/04/22 Heel Left Deep Tissue Pressure Injury - Purple or maroon localized area of discolored intact skin or blood-filled blister due to damage of underlying soft  tissue from pressure and/or shear. (Active)  09/04/22 1200  Location: Heel  Location Orientation: Left  Staging: Deep Tissue Pressure Injury - Purple or maroon localized area of discolored intact skin or blood-filled blister due to damage of underlying soft tissue from pressure and/or shear.  Wound Description (Comments):   Present on Admission:       Pressure Injury 10/10/22 Thigh Posterior;Proximal;Right Stage 2 -  Partial thickness loss of dermis presenting as a shallow open injury with a red, pink wound bed without slough. painful, bleeding (Active)  10/10/22 0500  Location: Thigh  Location Orientation: Posterior;Proximal;Right  Staging: Stage 2 -  Partial thickness loss of dermis presenting as a shallow open injury with a red, pink wound bed without slough.  Wound Description (Comments): painful, bleeding  Present on Admission:      Pressure Injury 10/10/22 Thigh Left;Posterior;Proximal Stage 2 -  Partial thickness loss of dermis presenting as a shallow open injury with a red, pink wound bed without slough. pink,skin pilled off (Active)  10/10/22 0500  Location: Thigh  Location Orientation: Left;Posterior;Proximal  Staging: Stage 2 -  Partial thickness loss of dermis presenting as a shallow open injury with a red, pink wound bed without slough.  Wound Description (Comments): pink,skin pilled off  Present on Admission:     Goals of care Patient had been seen by palliative care multiple times in the past.  Recently has been changed to DNR/DNI.  Discussion underway regarding home hospice.  Prognosis very guarded.  Spoke with the patient's daughter at bedside.  She is leaning towards symptom based care then aggressive at this time.   DVT prophylaxis: SCD's Start: 09/09/22 1841   Code Status:     Code Status: DNR  Disposition: Uncertain. Status is: Inpatient  Remains inpatient appropriate because: Overall poor prognosis, ongoing goals of care discussion,   Family Communication: Spoke with the patient's daughter at bedside.  Consultants:  Nephrology Palliative care  Procedures:  Left femoral exploration with pseudo aneurysm repair hematoma evacuation on 08/29/2022 Emergency CRRT PRBC transfusion Flexible sigmoidoscopy 09/12/2022 Emergent sigmoidoscopy with hemostatic gel application on 09/23/2020  suturing of the bleeding  ulcer on 09/26/2022  Antimicrobials:  None at this time  Subjective: Today, patient was seen and examined at bedside.  Patient denies any nausea vomiting fever chills or rigor.  Has had bright red blood per rectum yesterday.  Patient's family at bedside.  Objective: Vitals:   10/18/22 2326 10/19/22 0300 10/19/22 0439 10/19/22 0931  BP: (!) 156/73 125/82  134/69  Pulse: 88 85  87  Resp: 14 20  20   Temp:  98.4 F (36.9 C)  98.2 F (36.8 C)  TempSrc:  Oral  Oral  SpO2: 93% 97%  100%  Weight:   60.5 kg   Height:        Intake/Output Summary (Last 24 hours) at 10/19/2022 1306 Last data filed at 10/19/2022 0505 Gross per 24 hour  Intake 250 ml  Output 0 ml  Net 250 ml   Filed Weights   10/17/22 1352 10/18/22 0300 10/19/22 0439  Weight: 63 kg 68 kg 60.5 kg    Physical Examination: Body mass index is 20.28 kg/m.  General:  Average built, not in obvious distress, chronically ill, HENT:   Pallor noted.  Oral mucosa is moist.  Chest:  Clear breath sounds. . No crackles or wheezes.  CVS: S1 &S2 heard. No murmur.  Regular rate and rhythm. Abdomen: Soft, nontender, nondistended.  Bowel sounds are heard.   Extremities:  No cyanosis, clubbing or edema.  Black discoloration of the  left third, fourth and fifth toes Psych: Alert, awake and oriented, normal mood CNS:  No cranial nerve deficits.  Power equal in all extremities.   Skin: Warm and dry.  Black discoloration of the  left third, fourth and fifth toes  Data Reviewed:   CBC: Recent Labs  Lab 10/16/22 0849 10/17/22 0109 10/17/22 0931 10/18/22 0134 10/19/22 0550  WBC 14.0* 16.7* 17.4* 16.0* 19.6*  NEUTROABS  --   --   --  14.2*  --   HGB 8.8* 7.2* 6.9* 7.6* 7.2*  HCT 28.2* 23.8* 22.9* 23.9* 22.5*  MCV 98.9 98.3 100.0 98.0 99.1  PLT 255 276 303 227 261    Basic Metabolic Panel: Recent Labs  Lab 10/13/22 0830 10/14/22 0112 10/15/22 0140 10/16/22 0119 10/17/22 0109 10/18/22 0134 10/19/22 0550  NA 135   < > 134*  134* 134* 134* 133*  K 5.6*   < > 4.7 4.0 5.2* 3.5 3.9  CL 95*   < > 96* 95* 94* 93* 92*  CO2 24   < > 25 25 26 27 24   GLUCOSE 95   < > 119* 84 116* 96 97  BUN 129*   < > 106* 46* 95* 39* 66*  CREATININE 7.23*   < > 5.34* 3.06* 5.04* 2.74* 4.82*  CALCIUM 9.2   < > 8.8* 8.1* 8.9 8.6* 8.7*  MG  --   --   --   --   --  1.7  --   PHOS 4.4  --   --   --   --  3.1  --    < > = values in this interval not displayed.    Liver Function Tests: Recent Labs  Lab 10/13/22 0830 10/14/22 0112 10/18/22 0134  AST  --  18  --   ALT  --  16  --   ALKPHOS  --  111  --   BILITOT  --  1.1  --   PROT  --  5.5*  --   ALBUMIN 1.8* 1.7* 2.0*     Radiology Studies: No results found.    LOS: 52 days    Joycelyn Das, MD [facility] Available via Epic secure chat 7am-7pm After these hours, please refer to coverage provider listed on [email or web address] 10/19/2022, 1:06 PM

## 2022-10-19 NOTE — Consult Note (Addendum)
WOC Nurse wound follow up Vac was alarming last night and dressing was changed by the bedside nurse.  It is alarming again, requested to assess and troubleshoot.  Removed Vac dressing from left groin; wound is 90% beefy red, 10% yellow, 2.5X4X.1cm. Wound is shallow and it would be appropriate to discontinue the Vac dressing and apply topical treatment. Called Dr Karin Lieu of the vascular team and left a message, since he was unavailable; to determine further plan of care. Applied Aquacel and foam dressing and awaiting further recommendations.    Post note 1330:  Discussed plan of care with Dr Karin Lieu of the Vascular team. Vac d/ced and topical treatment orders provided for bedside nurses to perform as follows: Apply a piece of Aquacel [place] # [id number])to left groin Q day, then cover with foam dressing. Change foam dressing Q 3 days or PRN soiling.  Thank-you,  Cammie Mcgee MSN, RN, CWOCN, [place], CNS [phone]

## 2022-10-20 DIAGNOSIS — L89150 Pressure ulcer of sacral region, unstageable: Secondary | ICD-10-CM

## 2022-10-20 LAB — BASIC METABOLIC PANEL
Anion gap: 17 — ABNORMAL HIGH (ref 5–15)
BUN: 107 mg/dL — ABNORMAL HIGH (ref 8–23)
CO2: 22 mmol/L (ref 22–32)
Calcium: 8.3 mg/dL — ABNORMAL LOW (ref 8.9–10.3)
Chloride: 98 mmol/L (ref 98–111)
Creatinine, Ser: 6.48 mg/dL — ABNORMAL HIGH (ref 0.61–1.24)
GFR, Estimated: 9 mL/min — ABNORMAL LOW (ref >60)
Glucose, Bld: 113 mg/dL — ABNORMAL HIGH (ref 70–99)
Potassium: 4.3 mmol/L (ref 3.5–5.1)
Sodium: 137 mmol/L (ref 135–145)

## 2022-10-20 LAB — TYPE AND SCREEN
Unit division: 0
Unit division: 0
Unit division: 0

## 2022-10-20 LAB — CBC WITH DIFFERENTIAL/PLATELET
Abs Immature Granulocytes: 0.12 10*3/uL — ABNORMAL HIGH (ref 0.00–0.07)
Basophils Absolute: 0.1 10*3/uL (ref 0.0–0.1)
Basophils Relative: 0 %
Eosinophils Absolute: 0.1 10*3/uL (ref 0.0–0.5)
Eosinophils Relative: 1 %
HCT: 18.9 % — ABNORMAL LOW (ref 39.0–52.0)
Hemoglobin: 5.9 g/dL — CL (ref 13.0–17.0)
Immature Granulocytes: 1 %
Lymphocytes Relative: 5 %
Lymphs Abs: 1 10*3/uL (ref 0.7–4.0)
MCH: 31.4 pg (ref 26.0–34.0)
MCHC: 31.2 g/dL (ref 30.0–36.0)
MCV: 100.5 fL — ABNORMAL HIGH (ref 80.0–100.0)
Monocytes Absolute: 0.8 10*3/uL (ref 0.1–1.0)
Monocytes Relative: 4 %
Neutro Abs: 16.2 10*3/uL — ABNORMAL HIGH (ref 1.7–7.7)
Neutrophils Relative %: 89 %
Platelets: 256 10*3/uL (ref 150–400)
RBC: 1.88 MIL/uL — ABNORMAL LOW (ref 4.22–5.81)
RDW: 17.8 % — ABNORMAL HIGH (ref 11.5–15.5)
WBC: 18.2 10*3/uL — ABNORMAL HIGH (ref 4.0–10.5)
nRBC: 0 % (ref 0.0–0.2)

## 2022-10-20 LAB — BPAM RBC
Blood Product Expiration Date: 202406112359
ISSUE DATE / TIME: 202405141023
Unit Type and Rh: 5100
Unit Type and Rh: 5100

## 2022-10-20 LAB — PREPARE RBC (CROSSMATCH)

## 2022-10-20 LAB — HEMOGLOBIN AND HEMATOCRIT, BLOOD
HCT: 26.9 % — ABNORMAL LOW (ref 39.0–52.0)
Hemoglobin: 8.7 g/dL — ABNORMAL LOW (ref 13.0–17.0)

## 2022-10-20 MED ORDER — ALBUMIN HUMAN 25 % IV SOLN
25.0000 g | Freq: Once | INTRAVENOUS | Status: AC
  Administered 2022-10-20: 25 g via INTRAVENOUS

## 2022-10-20 MED ORDER — HEPARIN SODIUM (PORCINE) 1000 UNIT/ML IJ SOLN
INTRAMUSCULAR | Status: AC
  Administered 2022-10-20: 1000 [IU]
  Filled 2022-10-20: qty 4

## 2022-10-20 MED ORDER — SODIUM CHLORIDE 0.9% IV SOLUTION: Freq: Once | INTRAVENOUS | Status: DC

## 2022-10-20 NOTE — Progress Notes (Signed)
Nutrition Follow-up  DOCUMENTATION CODES:   Non-severe (moderate) malnutrition in context of chronic illness  INTERVENTION:   Continue juven BID  Continue prosource TID  Continue Vitamin A  Continue Vitamin C  Continue rena-vit      NUTRITION DIAGNOSIS:   Moderate Malnutrition (in the context of chronic illness (ESRD)) related to poor appetite as evidenced by moderate fat depletion, severe muscle depletion.   GOAL:   Patient will meet greater than or equal to 90% of their needs -ongoing  MONITOR:   PO intake, Supplement acceptance, I & O's, Labs, Weight trends  REASON FOR ASSESSMENT:   Consult, Ventilator Enteral/tube feeding initiation and management (trickle tube feeds)  ASSESSMENT:   67 y.o. male admitted with abdominal pain, nausea, diaphoresis and unable to feel L foot d/t L femoral pseudoaneurysm. Pt was hypotensive with lactic acidosis upon admission. PMH significant for ESRD, PAD, COPD, HLD, HTN, HFrEF 35-40%.  Labs: Glu 113, BUN 107, Cr 6.48, Hgb 5.9 Meds: vitamin C, phoslo, rena-vit, protonix, miralax, NS, crestor, vitamin A PO: 0-10% meal intake x last 2 documented meals  I/O's:  -2.1 L   Patient was in HD at time of visit. He had 3 bloody BM's last night per RN, hence his hgb being extremely low.   Patient no longer requires wound vac to groin. His sacral wound is not improving, however. Per MD, patient will likely go for AKA in a few weeks due to ischemia in toes.    Patient's po intake significantly decreased the past 2 documented meals. He is normally a good eater and consumes 75-100% of his meals.   No noted N/V/D/C.   Diet Order:   Diet Order             Diet regular Room service appropriate? Yes; Fluid consistency: Thin; Fluid restriction: 1200 mL Fluid  Diet effective now                   EDUCATION NEEDS:   Education needs have been addressed  Skin:  Skin Assessment: Skin Integrity Issues: Skin Integrity Issues:: Unstageable,  Other (Comment) DTI: R/L heel Stage II: n/a Unstageable: sacrum (DTI that evlolved into unstageable)- receiving hydrotherapy, 8 cms x 13 cms x 1 cm 80% black tan devitalized tissue 20% pink moist Wound Vac: L groin Incisions: L groin (closed) Other: Buttocks and gluteal crease/upper thighs with scattered areas of partial thickness and full thickness skin loss  Last BM:  5/14  Height:   Ht Readings from Last 1 Encounters:  09/26/22 5' 7.99" (1.727 m)    Weight:   Wt Readings from Last 1 Encounters:  10/20/22 60.5 kg    Ideal Body Weight:  70 kg  BMI:  Body mass index is 20.28 kg/m.  Estimated Nutritional Needs:   Kcal:  1900-2100  Protein:  110-130 g  Fluid:  1L + UOP    Leodis Rains, RDN, LDN  Clinical Nutrition

## 2022-10-20 NOTE — Progress Notes (Signed)
  Progress Note    10/20/2022 8:06 AM 24 Days Post-Op  Subjective: no complaints   Vitals:   10/20/22 0300 10/20/22 0738  BP: 129/81 123/60  Pulse: 76   Resp: 20   Temp:  97.9 F (36.6 C)  SpO2: 98%    Physical Exam: Cardiac:  regular Lungs:  non labored Incisions:  left groin with small bandage, site has healed up nicely.  Extremities:  Ischemic chance to all left toes. Dry gangrene - progressed. Now involving forefoot.  Abdomen:  soft, non distended Neurologic: alert and oriented  CBC    Component Value Date/Time   WBC 19.6 (H) 10/19/2022 0550   RBC 2.27 (L) 10/19/2022 0550   HGB 7.2 (L) 10/19/2022 0550   HGB 10.4 (L) 06/18/2021 1431   HCT 22.5 (L) 10/19/2022 0550   HCT 31.6 (L) 06/18/2021 1431   PLT 261 10/19/2022 0550   PLT 142 (L) 06/18/2021 1431   MCV 99.1 10/19/2022 0550   MCV 96 06/18/2021 1431   MCH 31.7 10/19/2022 0550   MCHC 32.0 10/19/2022 0550   RDW 18.2 (H) 10/19/2022 0550   RDW 14.3 06/18/2021 1431   LYMPHSABS 0.7 10/18/2022 0134   LYMPHSABS 1.5 06/18/2021 1431   MONOABS 0.8 10/18/2022 0134   EOSABS 0.2 10/18/2022 0134   EOSABS 1.0 (H) 06/18/2021 1431   BASOSABS 0.0 10/18/2022 0134   BASOSABS 0.1 06/18/2021 1431    BMET    Component Value Date/Time   NA 133 (L) 10/19/2022 0550   NA 141 06/18/2021 1431   K 3.9 10/19/2022 0550   CL 92 (L) 10/19/2022 0550   CO2 24 10/19/2022 0550   GLUCOSE 97 10/19/2022 0550   BUN 66 (H) 10/19/2022 0550   BUN 20 06/18/2021 1431   CREATININE 4.82 (H) 10/19/2022 0550   CREATININE 1.15 02/19/2017 1049   CALCIUM 8.7 (L) 10/19/2022 0550   CALCIUM 8.5 (L) 06/10/2020 0900   GFRNONAA 12 (L) 10/19/2022 0550   GFRAA 6 (L) 03/04/2020 1045    INR    Component Value Date/Time   INR 1.2 08/12/2022 1501     Intake/Output Summary (Last 24 hours) at 10/20/2022 0806 Last data filed at 10/19/2022 2300 Gross per 24 hour  Intake 500 ml  Output --  Net 500 ml      Assessment/Plan:  67 y.o. male is s/p  removal of infected left femoral patch, iliofemoral bypass, with sartorius muscle flap 09/09/22   Progression of ischemic changes to the toes- Will conitnue to monitor. Will ned AKA at some point. Possible in the coming weeks.  Appreciate [facility] nursing and their care - I am most concerned about his sacral ulcer that continues to progress in size. Will discuss the wound with [facility].  - Will discuss with general surgery - formal debridement myriad morcel matrix - There has been no significant healing. Continue therapies as able      Victorino Sparrow MD [facility] [phone] 10/20/2022 8:06 AM

## 2022-10-20 NOTE — Progress Notes (Signed)
PROGRESS NOTE    Albert Harris  ZOX:[id number] DOB: 07-Feb-1956 DOA: 08/28/2022 PCP: [name], NP    Brief Narrative:  Patient is a 67 years old patient with past medical history of ESRD on HD T/Thursday, Saturday, peripheral vascular disease, COPD, essential hypertension, chronic systolic congestive heart failure with ejection fraction 35 to 40%, initially presented to the hospital with with abdominal pain, nausea and diaphoresis. Patient was initially hypotensive with a lactic acidosis and was admitted to ICU,with a pulseless left lower extremity was found to have femoral pseudoaneurysm. Blood culture came back with MRSA bacteremia. TEE neg for vegetations.  Patient had prolonged and complicated hospitalization course as below.  08/29/22: Admitted to ICU for septic shock. s/p OR with Dr. Karin Lieu for left femoral exploration with pseudoaneurysm repair,hematoma evacuation>showed dehiscence at proximal aspect of bovine pericardial patch at the suture line. Emergent CRRT for severe hyperkalemia  09/08/22 Transferred to [facility] service   09/09/22 CTA for bleeding on left groin: showed Thrombosis of LEFT CFA pseudoaneurysm with new 3.5 cm filling defect, consistent with arterial thrombus, extending from the PSA neck and into the distal CFA. This is at risk for distal embolization and potential acute LEFT lower extremity ischemia. New 8 mm distal LEFT EIA pseudoaneurysm, medial to the stent distal landing zone. Mild interval enlargement of retroperitoneal hematoma, in a craniocaudal dimension measuring 12.2 cm (previously 11.2 cm).No CTA evidence of active extravasation. Patent RIGHT femoral-to-popliteal graft and bilateral iliac stents.Severe burden of splanchnic arterial atherosclerosis, at risk for chronic mesenteric ischemia.  09/09/22: urgent OR:S/P left common iliac to profunda femoris bypass with ipsilateral nonreversed greater saphenous vein in anatomic tunnel, left common iliac artery  angioplasty and stenting, left greater saphenous vein harvest, left sartorius muscle of left groin, left lower extremity angiography with first-order cannulation  09/12/22 am: 2 BRBPR> s/p Flex sig>showed 5 rectal ulcers, not actively bleeding, advised to avoid constipation, added Miralax BID  09/13/22 AM: Had 2 large bloody BM ~ 500cc> got 1 unit PRBC.  4/11 repeat sigmoidoscopy-several bleeding ulcers, pathology came back positive for Crohn's and started on mesalamine,   4/18: massive rebleeding-not a candidate for IR due to severe PVD s/p emergent sigmoidoscopy with hemostatic gel applied to bleeding ulcer  09/26/22: s/p suturing of bleeding ulcers by Dr. Magnus Ivan in OR.  10/13/22: Patient having progressive ischemic changes to the toes no plan for intervention at this time is to improve clinically prior to elective AKA and continuing on wound care hydrotherapy.  10/18/12 Patient did have episodes of rectal bleed.  Palliative care was consulted during hospitalization about goals of care and at this time is DNR  Assessment and Plan:   Acute blood loss anemia Recurrent hematochezia from anal/rectal ulcer- Crohns' disease: Status post multiple sigmoidoscopy, EGD and oversewing of ulcers and packing and was treated with Gelfoam.  GI and general surgery has signed off.  Continue budesonide 9 mg daily, per GI cont [place]asa PR x 14 doses from 5/9. S/p multiple transfusion ~ 42 units, continue to monitor and transfuse for less than 7 g.  Today, hemoglobin at 5.9 and will transfuse units of packed RBC..  Palliative care on board and patient is a very poor candidate for ongoing intervention.   Severe PVD Infected left femoral patch S/p ileofemoral bypass w/ sartorius muscle flap 4/3 Retroperitoneal hemorrhage Ischemic toes: Patient does have complicated vascular/surgical history. S/p left groin hematoma evacuation, primary repair of the distal external iliac artery. Further complicated on 4/3 by left  groin bleeding  with new pseudoaneurysm and arterial thrombus. S/p iliofemoral bypass and removal of infected patch with sartorius muscle flap. Having ongoing progressive ischemic changes to the toes> no plan for intervention at this time, plan is to improve clinically prior to elective AKA per vascular surgery.Will need left AKA when optimized.  MRSA/Enterococcus faecium bacteremia secondary to postop wound infection: Status post removal of infected patch.  Seen by ID>4/8 recommending daptomycin for 6 weeks  last day 10/23/22, will need oral suppression later- so need to contact ID at that time.TEE is negative for vegetations.Monitor CPK weekly   Sacral pressure injury POA, unstageable/deep continue wound care, offloading bed mattress, position changing.  Continue hydrotherapy twice weekly, pain management.  ESRD on HD TTS, s/p short-term CRRT on 3/23 - 3/26, nephrology following, RIJ TC +.  AVG clotted 3/27 status post declot by VVS then clotted again> s/p IR TDC placed 4/1, having bruit in AVG 5/7. continue HD per nephrology on board.   Anemia ESRD and acute blood loss anemia  Hemoglobin of 5.9 at this time.  Will transfuse 2 units with hemodialysis.    Chronic systolic CHF EF 35-40% TEE 4/1: EF 50-55%.  Severe LVH.  Compensated at this time.  Continue to monitor intake and output charting.'  Pulmonary hypertension-stable.   Hypertension: Blood pressure seems to be marginally low today.   Hyperlipidemia: Continue Crestor   Acute metabolic encephalopathy:  In the context of uremia and metabolic acidosis and opiates.  Alert awake and Communicative   Incidental lacunar stroke:4 mm acute infarct in the right subinsular white matter on MRI 09/04/2022. Neurology completed stroke workup echo EF 45% carotid Doppler bilateral ICA 40-59%, LDL 45 at goal, HbA1c 5.1.  PTA on Plavix.  At this time unable to use antiplatelet due to recurrent bleeding.   COPD: Continue bronchodilators.   Pain  management/uncontrolled back pain: On Tylenol Dilaudid tramadol for pain.  Moderate malnutrition/Hypoalbuminemia: Nutrition Status: Nutrition Problem: Moderate Malnutrition (in the context of chronic illness (ESRD)) Etiology: poor appetite Signs/Symptoms: moderate fat depletion, severe muscle depletion Interventions: Refer to RD note for recommendations, Liberalize Diet, Nepro shake, MVI      Pressure injuries  Present on admission.  Continue wound care. Pressure Injury 08/28/22 Sacrum Medial Unstageable - Full thickness tissue loss in which the base of the injury is covered by slough (yellow, tan, gray, green or brown) and/or eschar (tan, brown or black) in the wound bed. Sacral wound with eschar coveri (Active)  08/28/22 2228  Location: Sacrum  Location Orientation: Medial  Staging: Unstageable - Full thickness tissue loss in which the base of the injury is covered by slough (yellow, tan, gray, green or brown) and/or eschar (tan, brown or black) in the wound bed.  Wound Description (Comments): Sacral wound with eschar covering wound bed  Present on Admission: Yes     Pressure Injury 09/04/22 Heel Left Deep Tissue Pressure Injury - Purple or maroon localized area of discolored intact skin or blood-filled blister due to damage of underlying soft tissue from pressure and/or shear. (Active)  09/04/22 1200  Location: Heel  Location Orientation: Left  Staging: Deep Tissue Pressure Injury - Purple or maroon localized area of discolored intact skin or blood-filled blister due to damage of underlying soft tissue from pressure and/or shear.  Wound Description (Comments):   Present on Admission:      Pressure Injury 10/10/22 Thigh Posterior;Proximal;Right Stage 2 -  Partial thickness loss of dermis presenting as a shallow open injury with a red, pink wound bed  without slough. painful, bleeding (Active)  10/10/22 0500  Location: Thigh  Location Orientation: Posterior;Proximal;Right  Staging:  Stage 2 -  Partial thickness loss of dermis presenting as a shallow open injury with a red, pink wound bed without slough.  Wound Description (Comments): painful, bleeding  Present on Admission:      Pressure Injury 10/10/22 Thigh Left;Posterior;Proximal Stage 2 -  Partial thickness loss of dermis presenting as a shallow open injury with a red, pink wound bed without slough. pink,skin pilled off (Active)  10/10/22 0500  Location: Thigh  Location Orientation: Left;Posterior;Proximal  Staging: Stage 2 -  Partial thickness loss of dermis presenting as a shallow open injury with a red, pink wound bed without slough.  Wound Description (Comments): pink,skin pilled off  Present on Admission:     Goals of care Patient had been seen by palliative care multiple times in the past.  Recently has been changed to DNR/DNI.  Discussion underway regarding home hospice.  Prognosis very guarded.  Spoke with the patient's daughter at bedside on 10/19/2022.   DVT prophylaxis: SCD's Start: 09/09/22 1841   Code Status:     Code Status: DNR  Disposition: Uncertain. Status is: Inpatient  Remains inpatient appropriate because: Overall poor prognosis, ongoing goals of care discussion,   Family Communication: Spoke with the patient's daughter at bedside on 10/19/2022.  Consultants:  Nephrology Palliative care  Procedures:  Left femoral exploration with pseudo aneurysm repair hematoma evacuation on 08/29/2022 Emergency CRRT PRBC transfusion Flexible sigmoidoscopy 09/12/2022 Emergent sigmoidoscopy with hemostatic gel application on 09/23/2020  suturing of the bleeding ulcer on 09/26/2022  Antimicrobials:  None at this time  Subjective: Today, patient was seen and examined at bedside.  Nursing staff reported pubocervical bleeding overnight.  Seen during hemodialysis.  Patient denies any nausea vomiting fever chills or rigor but complains of foot pain.  Receiving PRBC..  Objective: Vitals:   10/20/22 1236  10/20/22 1237 10/20/22 1300 10/20/22 1302  BP: (!) 153/70 (!) 146/70 98/76 (!) 87/74  Pulse: 87  90 93  Resp: (!) 22  (!) 24 (!) 22  Temp:  98.7 F (37.1 C)    TempSrc:      SpO2: 96%  100% 100%  Weight:      Height:        Intake/Output Summary (Last 24 hours) at 10/20/2022 1325 Last data filed at 10/20/2022 1237 Gross per 24 hour  Intake 1130 ml  Output --  Net 1130 ml   Filed Weights   10/18/22 0300 10/19/22 0439 10/20/22 0700  Weight: 68 kg 60.5 kg 60.5 kg    Physical Examination: Body mass index is 20.28 kg/m.   General:  Average built, not in obvious distress, chronically ill, appears weak and deconditioned HENT:   Pallor noted.  Oral mucosa is moist.  Chest:  Clear breath sounds. . No crackles or wheezes.  CVS: S1 &S2 heard. No murmur.  Regular rate and rhythm. Abdomen: Soft, nontender, nondistended.  Bowel sounds are heard.   Extremities: No cyanosis, clubbing or edema.  Black discoloration of the  left third, fourth and fifth toes due to ischemia. Psych: Alert, awake and Communicative.   CNS:  No cranial nerve deficits.  Generalized weakness noted.   Skin: Warm and dry.  Black discoloration of the  left third, fourth and fifth toes  Data Reviewed:   CBC: Recent Labs  Lab 10/17/22 0109 10/17/22 0931 10/18/22 0134 10/19/22 0550 10/20/22 0810  WBC 16.7* 17.4* 16.0* 19.6* 18.2*  NEUTROABS  --   --  14.2*  --  16.2*  HGB 7.2* 6.9* 7.6* 7.2* 5.9*  HCT 23.8* 22.9* 23.9* 22.5* 18.9*  MCV 98.3 100.0 98.0 99.1 100.5*  PLT 276 303 227 261 256    Basic Metabolic Panel: Recent Labs  Lab 10/16/22 0119 10/17/22 0109 10/18/22 0134 10/19/22 0550 10/20/22 0810  NA 134* 134* 134* 133* 137  K 4.0 5.2* 3.5 3.9 4.3  CL 95* 94* 93* 92* 98  CO2 25 26 27 24 22   GLUCOSE 84 116* 96 97 113*  BUN 46* 95* 39* 66* 107*  CREATININE 3.06* 5.04* 2.74* 4.82* 6.48*  CALCIUM 8.1* 8.9 8.6* 8.7* 8.3*  MG  --   --  1.7  --   --   PHOS  --   --  3.1  --   --     Liver  Function Tests: Recent Labs  Lab 10/14/22 0112 10/18/22 0134  AST 18  --   ALT 16  --   ALKPHOS 111  --   BILITOT 1.1  --   PROT 5.5*  --   ALBUMIN 1.7* 2.0*     Radiology Studies: No results found.    LOS: 53 days    Joycelyn Das, MD [facility] Available via Epic secure chat 7am-7pm After these hours, please refer to coverage provider listed on [email or web address] 10/20/2022, 1:25 PM

## 2022-10-20 NOTE — Progress Notes (Signed)
Physical Therapy Wound Treatment and Discharge Patient Details  Name: Albert Harris MRN: [id number] Date of Birth: 11-23-1955  Today's Date: 10/20/2022 Time: 4098-1191 Time Calculation (min): 38 min  Subjective  Subjective Assessment Subjective: Oh this is going to hurt.Marland KitchenMarland KitchenI already hurt Patient and Family Stated Goals: Heal wound Date of Onset:  (unknown) Prior Treatments: Dressing changes  Pain Score:   Pre-medicated for pain by RN; pt describing severe pain at sacrum at all times  Wound Assessment  Pressure Injury 08/28/22 Sacrum Medial Unstageable - Full thickness tissue loss in which the base of the injury is covered by slough (yellow, tan, gray, green or brown) and/or eschar (tan, brown or black) in the wound bed. Sacral wound with eschar coveri (Active)  Wound Image   10/20/22 1524  Dressing Type Foam - Lift dressing to assess site every shift;Gauze (Comment);Santyl;Normal saline moist dressing 10/20/22 1524  Dressing Changed 10/20/22 1524  Dressing Change Frequency Twice a day 10/20/22 1524  State of Healing Early/partial granulation 10/20/22 1524  Site / Wound Assessment Bleeding;Granulation tissue;Red;Yellow;Black 10/20/22 1524  % Wound base Red or Granulating 85% 10/20/22 1524  % Wound base Yellow/Fibrinous Exudate 10% 10/20/22 1524  % Wound base Black/Eschar 5% 10/20/22 1524  % Wound base Other/Granulation Tissue (Comment) 30% 10/14/22 1445  Peri-wound Assessment Erythema (blanchable);Pink 10/20/22 1524  Wound Length (cm) 12.8 cm 10/20/22 1524  Wound Width (cm) 12.8 cm 10/20/22 1524  Wound Depth (cm) 2.9 cm 10/20/22 1524  Wound Surface Area (cm^2) 163.84 cm^2 10/20/22 1524  Wound Volume (cm^3) 475.14 cm^3 10/20/22 1524  Tunneling (cm) 0 09/29/22 1327  Undermining (cm) at 3:00 3.2 cm 10/20/22 1524  Margins Unattached edges (unapproximated) 10/20/22 1524  Drainage Amount Scant 10/20/22 1524  Drainage Description Serosanguineous 10/20/22 1524  Treatment Debridement  (Selective);Irrigation 10/20/22 1524      Selective Debridement (non-excisional) Selective Debridement (non-excisional) - Location: Sacrum Selective Debridement (non-excisional) - Tools Used: Forceps, Scalpel Selective Debridement (non-excisional) - Tissue Removed: yellow and black nonviable tissue    Wound Assessment and Plan  Wound Therapy - Assess/Plan/Recommendations Wound Therapy - Clinical Statement: Pt with large bloody stool on arrival. RN called in to see. Cleaned up and wound care provided. Wound looked significantly better with very little necrotic tissue present. Do not feel hydrotherapy with debridement is needed. Will discharge from hydrotherapy. Wound Therapy - Functional Problem List: Decreased tolerance for repositioning and OOB due to wound; global weakness in the setting of extended hospitalization and now ICU stay. Factors Delaying/Impairing Wound Healing: Immobility, Multiple medical problems, Polypharmacy Hydrotherapy Plan: Debridement, Dressing change, Patient/family education Wound Therapy - Frequency: 2X / week Wound Therapy - Follow Up Recommendations: dressing changes by RN  Wound Therapy Goals- Improve the function of patient's integumentary system by progressing the wound(s) through the phases of wound healing (inflammation - proliferation - remodeling) by: Wound Therapy Goals - Improve the function of patient's integumentary system by progressing the wound(s) through the phases of wound healing by: Decrease Necrotic Tissue to: 20% Decrease Necrotic Tissue - Progress: Met Increase Granulation Tissue to: 80% Increase Granulation Tissue - Progress: Met Goals/treatment plan/discharge plan were made with and agreed upon by patient/family: Yes Time For Goal Achievement: 7 days Wound Therapy - Potential for Goals: Fair  Goals will be updated until maximal potential achieved or discharge criteria met.  Discharge criteria: when goals achieved, discharge from  hospital, MD decision/surgical intervention, no progress towards goals, refusal/missing three consecutive treatments without notification or medical reason.  GP  Charges PT Wound Care Charges $Wound Debridement up to 20 cm: < or equal to 20 cm $ Wound Debridement each add'l 20 sqcm: 1 $PT Hydrotherapy Dressing: 1 dressing $PT Hydrotherapy Visit: 1 Visit      [name], PT Acute Rehabilitation Services  Office [phone]   [name] 10/20/2022, 3:32 PM

## 2022-10-20 NOTE — Progress Notes (Signed)
   10/20/22 1358  Vitals  Temp 98.4 F (36.9 C)  Pulse Rate 93  Resp (!) 22  BP (!) 156/60  SpO2 100 %  O2 Device Room Air  Type of Weight (S)  Post-Dialysis (Unable to weight pt. Bed scale is nonfunctional)  Oxygen Therapy  Patient Activity (if Appropriate) In bed  Pulse Oximetry Type Continuous  During Treatment Monitoring  Intra-Hemodialysis Comments Tx completed   Received patient in bed to unit.  Alert and oriented.  Informed consent signed and in chart.   TX duration:3.5 hours  Patient tolerated well.  Transported back to the room  Alert, without acute distress.  Hand-off given to patient's nurse.   Access used: Yes Access issues: No  Total UF removed: 1000 ml Medication(s) given: 2 units RBC, Heparin 1.6cc, per HD port Post HD VS: See Above Grid Post HD weight: No Bed Scale   [facility]

## 2022-10-20 NOTE — Progress Notes (Signed)
Palliative Medicine Progress Note   Patient Name: Albert Harris       Date: 10/20/2022 DOB: 1955/11/30  Age: 67 y.o. MRN#: [id number] Attending Physician: Joycelyn Das, MD Primary Care Physician: [name], NP Admit Date: 08/28/2022  Reason for Consultation/Follow-up: {Reason for Consult:23484}  HPI/Patient Profile: 67 y.o. male  with past medical history of ESRD on HD, peripheral vascular disease, chronic systolic CHF with EF 35 to 40%, and COPD.  He presented to [facility] on 08/28/2022 with abdominal pain, nausea, and unable to feel his left foot.  He was found to have a left femoral pseudoaneurysm and was taken to the OR for repair of his distal left external iliac artery.  He required emergent CRRT due to hypotension and hyperkalemia.  Hospitalization has been complicated by MRSA bacteremia secondary to post-op wound infection and recurrent rectal bleeding requiring multiple transfusions.    Palliative Medicine was consulted for goals of care in the setting of ESRD, severe vascular disease, and bleeding.  Subjective: Chart reviewed and update received from Dr. Tyson Babinski.  Patient continues to have GI bleed and hemoglobin dropped to 5.9 today. He is to receive 2 units PRBCs during dialysis.     Objective:  Physical Exam          Vital Signs: BP (!) 156/60   Pulse 93   Temp 98.4 F (36.9 C)   Resp (!) 22   Ht 5' 7.99" (1.727 m)   Wt 60.5 kg   SpO2 100%   BMI 20.28 kg/m  SpO2: SpO2: 100 % O2 Device: O2 Device: Room Air    Palliative Medicine Assessment & Plan   Assessment: Active Problems:   Lower GI bleed   ESRD (end stage renal disease) on dialysis (HCC)   Malnutrition of moderate degree   Pseudoaneurysm of femoral artery (HCC)   Decreased functional mobility    Pressure injury of skin   Colonic ulcer   Rectal arterial hemorrhage   Rectal ulcer   Hematochezia   Acute blood loss anemia   MRSA bacteremia    Recommendations/Plan: ***  Goals of Care and Additional Recommendations: Limitations on Scope of Treatment: {Recommended Scope and Preferences:21019}  Code Status:   Prognosis:  {Palliative Care Prognosis:23504}  Discharge Planning: {Palliative dispostion:23505}  Care plan was discussed with ***  Thank you for allowing  the Palliative Medicine Team to assist in the care of this patient.   ***   Merry Proud, NP   Please contact Palliative Medicine Team phone at [phone] for questions and concerns.  For individual providers, please see AMION.

## 2022-10-20 NOTE — Progress Notes (Signed)
[facility] Progress Note   Subjective:    Seen and examined patient on HD unit. Patient is about to begin treatment. Noted Hgb dropped this morning to 5.9 and 2 units PRBCs already ordered. He will receive the blood during today's treatment. Reports ongoing generalized pain but denies SOB and CP. UFG set for 1-1.5L. SBPs now ranging 90-100s. Keep SBP >90 during today's HD.  Objective Vitals:   10/20/22 0300 10/20/22 0700 10/20/22 0738 10/20/22 1002  BP: 129/81  123/60 105/61  Pulse: 76   77  Resp: 20  20 20   Temp:   97.9 F (36.6 C)   TempSrc:   Oral   SpO2: 98%   100%  Weight:  60.5 kg    Height:       Physical Exam General: Chronically ill and frail man. NAD. Room air. Heart: RRR; no murmur Lungs: CTA anteriorly Abdomen: soft Extremities: No LE edema; ischemic/gangrene L toes; wound vac noted L groin Dialysis Access: TDC, L forearm AVG + bruit  Filed Weights   10/18/22 0300 10/19/22 0439 10/20/22 0700  Weight: 68 kg 60.5 kg 60.5 kg    Intake/Output Summary (Last 24 hours) at 10/20/2022 1005 Last data filed at 10/19/2022 2300 Gross per 24 hour  Intake 500 ml  Output --  Net 500 ml    Additional Objective Labs: Basic Metabolic Panel: Recent Labs  Lab 10/18/22 0134 10/19/22 0550 10/20/22 0810  NA 134* 133* 137  K 3.5 3.9 4.3  CL 93* 92* 98  CO2 27 24 22   GLUCOSE 96 97 113*  BUN 39* 66* 107*  CREATININE 2.74* 4.82* 6.48*  CALCIUM 8.6* 8.7* 8.3*  PHOS 3.1  --   --    Liver Function Tests: Recent Labs  Lab 10/14/22 0112 10/18/22 0134  AST 18  --   ALT 16  --   ALKPHOS 111  --   BILITOT 1.1  --   PROT 5.5*  --   ALBUMIN 1.7* 2.0*   No results for input(s): "LIPASE", "AMYLASE" in the last 168 hours. CBC: Recent Labs  Lab 10/17/22 0109 10/17/22 0931 10/18/22 0134 10/19/22 0550 10/20/22 0810  WBC 16.7* 17.4* 16.0* 19.6* 18.2*  NEUTROABS  --   --  14.2*  --  16.2*  HGB 7.2* 6.9* 7.6* 7.2* 5.9*  HCT 23.8* 22.9* 23.9* 22.5* 18.9*   MCV 98.3 100.0 98.0 99.1 100.5*  PLT 276 303 227 261 256   Blood Culture    Component Value Date/Time   SDES TISSUE 09/09/2022 1453   SPECREQUEST VESSEL PT ON VANC ANCEF 09/09/2022 1453   CULT  09/09/2022 1453    RARE STAPHYLOCOCCUS AUREUS SUSCEPTIBILITIES PERFORMED ON PREVIOUS CULTURE WITHIN THE LAST 5 DAYS. NO ANAEROBES ISOLATED Performed at [facility], [address] [place], [place] [address]    REPTSTATUS 09/14/2022 FINAL 09/09/2022 1453    Cardiac Enzymes: Recent Labs  Lab 10/19/22 0550  CKTOTAL 22*   CBG: No results for input(s): "GLUCAP" in the last 168 hours. Iron Studies: No results for input(s): "IRON", "TIBC", "TRANSFERRIN", "FERRITIN" in the last 72 hours. Lab Results  Component Value Date   INR 1.2 08/12/2022   INR 1.3 (H) 06/29/2022   INR 1.3 (H) 06/28/2022   Studies/Results: No results found.  Medications:  sodium chloride 0 mL/hr at 09/22/22 0757   DAPTOmycin (CUBICIN) 600 mg in sodium chloride 0.9 % IVPB 600 mg (10/15/22 1633)   DAPTOmycin (CUBICIN) 750 mg in sodium chloride 0.9 % IVPB 750  mg (10/17/22 1810)   sodium chloride      (feeding supplement) PROSource Plus  30 mL Oral TID WC   sodium chloride   Intravenous Once   acetaminophen  650 mg Oral Q6H   ascorbic acid  250 mg Oral BID   budesonide  9 mg Oral Daily   calcium acetate  667 mg Oral TID WC   Chlorhexidine Gluconate Cloth  6 each Topical Q0600   collagenase   Topical Daily   darbepoetin (ARANESP) injection - DIALYSIS  200 mcg Subcutaneous Q Wed-1800   liver oil-zinc oxide   Topical TID   mesalamine  1,000 mg Rectal QHS   methocarbamol  500 mg Oral TID   metoprolol succinate  25 mg Oral Daily   multivitamin  1 tablet Oral QHS   nutrition supplement (JUVEN)  1 packet Oral BID BM   pantoprazole  40 mg Oral BID   polyethylene glycol  17 g Oral Daily   rosuvastatin  10 mg Oral Daily   sodium chloride flush  10-40 mL Intracatheter Q12H   vitamin A  10,000 Units Oral Daily     Dialysis Orders: SW TTS 4h   350/800  64.5kg  new TDC (RFA AVG clotted)  Heparin none  - venofer 50mg  IV weekly - rocaltrol 0.25 mcg po tiw - phoslo 2 ac tid - no esa, last Hb 11.0  Assessment/Plan: Recurrent lower GIB: S/p colonoscopy 09/15/22, then repeat sigmoidoscopy 4/11. Several bleeding ulcers addressed.  Path came back + Crohn's - started on mesalamine by GI. Brisk re-bleed 4/18, took again for emergent sigmoidoscopy with hemostatic gel applied to bleeding ulcer. Then bled again requiring suturing of bleeding ulcers 4/20 by Dr. Magnus Ivan. Hgb 8.8 -> 6.9 on 5/11 - given another 1U blood. Hgb dropped to 5.9 today-he will receive 2 units PRBCs with HD today. 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 S/p iliofemoral bypass, removal of infected patch with sartorius muscle flap 09/09/22. Ischemic changes to L 3rd-5th toes persist. Continue to monitor. 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 Karin Lieu in the upcoming weeks. Sacral wound: Non-healing and progressing in size per VVS. Followed by [facility]. 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 CVA on MRI. Neuro signed off.  ESRD: S/p short-term CRRT 3/23- 3/26, now back on iHD on TTS schedule. On HD 5/14 using TDC.  BP/volume: No LE edema, below prior EDW. Anemia (ESRD + ABLA): Last transfused 2U on 5/14 (total of 28U this admit). Also on Aranesp q Wed (max dose).  Secondary HPTH: CorrCa high, phos at goal. Holding VDRA, Phoslo dose has been reduced. Dialysis access: AVG clotted 3/27, went for declot by VVS and then re-clotted. VVS placed R TDC on 4/1 but there is a strong bruit in the AVG 5/7 - attempted and unable to cannulate on 5/9. Use catheter for now. Hypoalbuminemia - Alb 1.7, in hospital > 40 days. On supplements. Goals of care: Palliative care has revisited, see their notes. Pt previously  had not been responsive to suggestions of hospice. Finally changed to DNR as of 5/11, still desiring HD and other full scope for now but family is considering.  Salome Holmes, NP [facility] 10/20/2022,10:05 AM  LOS: 53 days

## 2022-10-20 NOTE — Progress Notes (Signed)
Pt had large dark red bloody bowel movement x 3 last night. Vital signs stable. Pain was well controlled with dilaudid PRN. We handed off to the morning shift to monitor CBC.   Filiberto Pinks, RN

## 2022-10-20 NOTE — Progress Notes (Signed)
OT Cancellation Note  Patient Details Name: Albert Harris MRN: [id number] DOB: [birth year]   Cancelled Treatment:    Reason Eval/Treat Not Completed: Patient at procedure or test/ unavailable Off unit for HD. Will follow up tomorrow for OT session. Lorre Munroe 10/20/2022, 10:29 AM

## 2022-10-21 LAB — BPAM RBC
Blood Product Expiration Date: 202406132359
Blood Product Expiration Date: 202406132359
ISSUE DATE / TIME: 202405111157
ISSUE DATE / TIME: 202405141023
Unit Type and Rh: 5100
Unit Type and Rh: 5100

## 2022-10-21 LAB — CBC
HCT: 28.5 % — ABNORMAL LOW (ref 39.0–52.0)
Hemoglobin: 9.1 g/dL — ABNORMAL LOW (ref 13.0–17.0)
MCH: 30.8 pg (ref 26.0–34.0)
MCHC: 31.9 g/dL (ref 30.0–36.0)
MCV: 96.6 fL (ref 80.0–100.0)
Platelets: 238 10*3/uL (ref 150–400)
RBC: 2.95 MIL/uL — ABNORMAL LOW (ref 4.22–5.81)
RDW: 17.4 % — ABNORMAL HIGH (ref 11.5–15.5)
WBC: 30.4 10*3/uL — ABNORMAL HIGH (ref 4.0–10.5)
nRBC: 0 % (ref 0.0–0.2)

## 2022-10-21 LAB — HEMOGLOBIN AND HEMATOCRIT, BLOOD
HCT: 22.7 % — ABNORMAL LOW (ref 39.0–52.0)
Hemoglobin: 7.5 g/dL — ABNORMAL LOW (ref 13.0–17.0)

## 2022-10-21 LAB — TYPE AND SCREEN: ABO/RH(D): O POS

## 2022-10-21 MED ORDER — HEPARIN SODIUM (PORCINE) 1000 UNIT/ML DIALYSIS
1000.0000 [IU] | INTRAMUSCULAR | Status: DC | PRN
  Filled 2022-10-21: qty 1

## 2022-10-21 MED ORDER — PENTAFLUOROPROP-TETRAFLUOROETH EX AERO: 1.0000 | INHALATION_SPRAY | CUTANEOUS | Status: DC | PRN

## 2022-10-21 MED ORDER — ALTEPLASE 2 MG IJ SOLR: 2.0000 mg | Freq: Once | INTRAMUSCULAR | Status: DC | PRN

## 2022-10-21 NOTE — Progress Notes (Signed)
  Daily Progress Note  I had a long conversation with Albert Harris today regarding his multiple hospital problems and current comorbidities.  This family meeting involved his wife, daughters, grandson, and palliative care colleagues.  Albert Harris understands that unfortunately he is hematochezia is untreatable, except for blood administration.  His sacral wound continues to worsen, and his left lower extremity necrosis continues to progress.  We had an honest discussion with Albert Harris regarding stopping dialysis and comfort care measures in an effort to get him home.    Albert Harris would like to move forward.  He asked for 1 more round of dialysis tomorrow in an effort to give him a longer life expectancy once he is on home hospice.  Please see palliative care - Arsenio Katz note for further details.   I am very appreciate of the effort and care Albert Harris has received from nursing, [facility], hospital medicine, nephrology, dialysis, and palliative care, and all other involved teams.    Albert Olden MD MS [facility] [phone] 10/21/2022  2:56 PM

## 2022-10-21 NOTE — Progress Notes (Signed)
PROGRESS NOTE    Albert Harris  ZOX:[id number] DOB: May 08, 1956 DOA: 08/28/2022 PCP: [name], NP    Brief Narrative:  Patient is a 67 years old patient with past medical history of ESRD on HD T/Thursday, Saturday, peripheral vascular disease, COPD, essential hypertension, chronic systolic congestive heart failure with ejection fraction 35 to 40%, initially presented to the hospital with with abdominal pain, nausea and diaphoresis. Patient was initially hypotensive with a lactic acidosis and was admitted to ICU,with a pulseless left lower extremity was found to have femoral pseudoaneurysm. Blood culture came back with MRSA bacteremia. TEE neg for vegetations.  Patient had prolonged and complicated hospitalization course as below.  08/29/22: Admitted to ICU for septic shock. s/p OR with Dr. Karin Lieu for left femoral exploration with pseudoaneurysm repair,hematoma evacuation>showed dehiscence at proximal aspect of bovine pericardial patch at the suture line. Emergent CRRT for severe hyperkalemia  09/08/22 Transferred to [facility] service   09/09/22 CTA for bleeding on left groin: showed Thrombosis of LEFT CFA pseudoaneurysm with new 3.5 cm filling defect, consistent with arterial thrombus, extending from the PSA neck and into the distal CFA. This is at risk for distal embolization and potential acute LEFT lower extremity ischemia. New 8 mm distal LEFT EIA pseudoaneurysm, medial to the stent distal landing zone. Mild interval enlargement of retroperitoneal hematoma, in a craniocaudal dimension measuring 12.2 cm (previously 11.2 cm).No CTA evidence of active extravasation. Patent RIGHT femoral-to-popliteal graft and bilateral iliac stents.Severe burden of splanchnic arterial atherosclerosis, at risk for chronic mesenteric ischemia.  09/09/22: urgent OR:S/P left common iliac to profunda femoris bypass with ipsilateral nonreversed greater saphenous vein in anatomic tunnel, left common iliac artery  angioplasty and stenting, left greater saphenous vein harvest, left sartorius muscle of left groin, left lower extremity angiography with first-order cannulation  09/12/22 am: 2 BRBPR> s/p Flex sig>showed 5 rectal ulcers, not actively bleeding, advised to avoid constipation, added Miralax BID  09/13/22 AM: Had 2 large bloody BM ~ 500cc> got 1 unit PRBC.  4/11 repeat sigmoidoscopy-several bleeding ulcers, pathology came back positive for Crohn's and started on mesalamine,   4/18: massive rebleeding-not a candidate for IR due to severe PVD s/p emergent sigmoidoscopy with hemostatic gel applied to bleeding ulcer  09/26/22: s/p suturing of bleeding ulcers by Dr. Magnus Ivan in OR.  10/13/22: Patient having progressive ischemic changes to the toes no plan for intervention at this time is to improve clinically prior to elective AKA and continuing on wound care hydrotherapy.  10/18/12 Patient did have episodes of rectal bleed.  10/20/12 Patient continues to have rectal bleeding and received PRBC.  Palliative care has been reconsulted for goals of care.  Palliative care was consulted during hospitalization about goals of care and at this time is DNR  Assessment and Plan:   Acute blood loss anemia Recurrent hematochezia from anal/rectal ulcer- Crohns' disease: Status post multiple sigmoidoscopy, EGD and oversewing of ulcers and packing and was treated with Gelfoam.  GI and general surgery has signed off.  Continue budesonide 9 mg daily, per GI cont Canasa PR x 14 doses from 5/9. S/p multiple transfusion ~ 42 units, continue to monitor and transfuse for less than 7 g.  Received 2 units of packed RBC for hemoglobin of 5.9 yesterday.  Continues to have rectal bleed.  Hemoglobin today at 9.1.  Palliative care on board and patient is a very poor candidate for ongoing intervention.  Severe PVD Infected left femoral patch S/p ileofemoral bypass w/ sartorius muscle flap 4/3 Retroperitoneal  hemorrhage Ischemic  toes: Patient does have complicated vascular/surgical history. S/p left groin hematoma evacuation, primary repair of the distal external iliac artery. Further complicated on 4/3 by left groin bleeding with new pseudoaneurysm and arterial thrombus. S/p iliofemoral bypass and removal of infected patch with sartorius muscle flap. Having ongoing progressive ischemic changes to the toes> no plan for intervention at this time, plan is to improve clinically prior to elective AKA per vascular surgery.Will need left AKA when optimized.  MRSA/Enterococcus faecium bacteremia secondary to postop wound infection: Status post removal of infected patch.  Seen by ID>4/8 recommending daptomycin for 6 weeks  last day 10/23/22, will need oral suppression later- so need to contact ID at that time.TEE is negative for vegetations.Monitor CPK weekly   Sacral pressure injury POA, unstageable/deep continue wound care, offloading bed mattress, position changing.  Continue hydrotherapy twice weekly, pain management.  ESRD on HD TTS, s/p short-term CRRT on 3/23 - 3/26, nephrology following, RIJ TC +.  AVG clotted 3/27 status post declot by VVS then clotted again> s/p IR TDC placed 4/1, having bruit in AVG 5/7. continue HD per nephrology on board.   Anemia ESRD and acute blood loss anemia  Hemoglobin of 5.9 at this time.  Will transfuse 2 units with hemodialysis.    Chronic systolic CHF EF 35-40% TEE 4/1: EF 50-55%.  Severe LVH.  Compensated at this time.  Continue to monitor intake and output charting.'  Pulmonary hypertension-stable.   Hypertension: Continue to monitor.   Hyperlipidemia: Continue Crestor   Acute metabolic encephalopathy:  In the context of uremia and metabolic acidosis and opiates.  Alert awake and Communicative   Incidental lacunar stroke:4 mm acute infarct in the right subinsular white matter on MRI 09/04/2022. Neurology completed stroke workup echo EF 45% carotid Doppler bilateral ICA 40-59%, LDL 45  at goal, HbA1c 5.1.  PTA on Plavix.  At this time unable to use antiplatelet due to recurrent bleeding.   COPD: Continue bronchodilators.   Pain management/uncontrolled back pain: On Tylenol Dilaudid tramadol for pain.  Moderate malnutrition/Hypoalbuminemia: Nutrition Status: Nutrition Problem: Moderate Malnutrition (in the context of chronic illness (ESRD)) Etiology: poor appetite Signs/Symptoms: moderate fat depletion, severe muscle depletion Interventions: Refer to RD note for recommendations, Liberalize Diet, Nepro shake, MVI      Pressure injuries  Present on admission.  Continue wound care. Pressure Injury 08/28/22 Sacrum Medial Unstageable - Full thickness tissue loss in which the base of the injury is covered by slough (yellow, tan, gray, green or brown) and/or eschar (tan, brown or black) in the wound bed. Sacral wound with eschar coveri (Active)  08/28/22 2228  Location: Sacrum  Location Orientation: Medial  Staging: Unstageable - Full thickness tissue loss in which the base of the injury is covered by slough (yellow, tan, gray, green or brown) and/or eschar (tan, brown or black) in the wound bed.  Wound Description (Comments): Sacral wound with eschar covering wound bed  Present on Admission: Yes     Pressure Injury 09/04/22 Heel Left Deep Tissue Pressure Injury - Purple or maroon localized area of discolored intact skin or blood-filled blister due to damage of underlying soft tissue from pressure and/or shear. (Active)  09/04/22 1200  Location: Heel  Location Orientation: Left  Staging: Deep Tissue Pressure Injury - Purple or maroon localized area of discolored intact skin or blood-filled blister due to damage of underlying soft tissue from pressure and/or shear.  Wound Description (Comments):   Present on Admission:  Pressure Injury 10/10/22 Thigh Posterior;Proximal;Right Stage 2 -  Partial thickness loss of dermis presenting as a shallow open injury with a red, pink  wound bed without slough. painful, bleeding (Active)  10/10/22 0500  Location: Thigh  Location Orientation: Posterior;Proximal;Right  Staging: Stage 2 -  Partial thickness loss of dermis presenting as a shallow open injury with a red, pink wound bed without slough.  Wound Description (Comments): painful, bleeding  Present on Admission:      Pressure Injury 10/10/22 Thigh Left;Posterior;Proximal Stage 2 -  Partial thickness loss of dermis presenting as a shallow open injury with a red, pink wound bed without slough. pink,skin pilled off (Active)  10/10/22 0500  Location: Thigh  Location Orientation: Left;Posterior;Proximal  Staging: Stage 2 -  Partial thickness loss of dermis presenting as a shallow open injury with a red, pink wound bed without slough.  Wound Description (Comments): pink,skin pilled off  Present on Admission:     Goals of care Educated on goal and having a family meeting today for goals of care discussion.  Prognosis is poor.  Spoke with the patient's daughter and wife at bedside on 10/21/2022.   DVT prophylaxis: SCD's Start: 09/09/22 1841   Code Status:     Code Status: DNR  Disposition: Uncertain. Status is: Inpatient  Remains inpatient appropriate because: Overall poor prognosis, ongoing goals of care discussion, ongoing GI bleed.   Family Communication:  Spoke with the patient's daughter and wife at bedside on 10/21/2022.  Consultants:  Nephrology Palliative care Vascular surgery  Procedures:  Left femoral exploration with pseudo aneurysm repair hematoma evacuation on 08/29/2022 Emergency CRRT PRBC transfusion Flexible sigmoidoscopy 09/12/2022 Emergent sigmoidoscopy with hemostatic gel application on 09/23/2020  suturing of the bleeding ulcer on 09/26/2022  Antimicrobials:  None at this time  Subjective: Today, patient was seen and examined at bedside.  Nursing staff reports that he continues to have rectal bleed.  Denies any nausea vomiting fever  chills or rigor.  Patient's wife and daughter at bedside.    Objective: Vitals:   10/21/22 0305 10/21/22 0745 10/21/22 1054 10/21/22 1155  BP: (!) 116/38 132/60 (!) 157/58 (!) 150/70  Pulse: 79  82 90  Resp: 19   20  Temp: 97.6 F (36.4 C) 98.6 F (37 C) 98 F (36.7 C)   TempSrc: Oral Oral Oral   SpO2: 100%  96% 92%  Weight:      Height:        Intake/Output Summary (Last 24 hours) at 10/21/2022 1444 Last data filed at 10/21/2022 0224 Gross per 24 hour  Intake 500 ml  Output --  Net 500 ml    Filed Weights   10/19/22 0439 10/20/22 0700 10/21/22 0228  Weight: 60.5 kg 60.5 kg 68.5 kg    Physical Examination: Body mass index is 22.97 kg/m.   General:  Average built, not in obvious distress, appears weak and deconditioned, HENT:   Pallor noted.  Oral mucosa is moist.  Chest:  Clear breath sounds. . No crackles or wheezes.  CVS: S1 &S2 heard. No murmur.  Regular rate and rhythm. Abdomen: Soft, nontender, nondistended.  Bowel sounds are heard.   Extremities: No cyanosis, clubbing or edema.  Black discoloration of the  left third, fourth and fifth toes due to ischemia. Psych: Alert, awake and Communicative.   CNS:  No cranial nerve deficits.  Generalized weakness noted.   Skin: Warm and dry.  Black discoloration of the  left third, fourth and fifth toes  Data Reviewed:  CBC: Recent Labs  Lab 10/17/22 0931 10/18/22 0134 10/19/22 0550 10/20/22 0810 10/20/22 1905 10/21/22 1228  WBC 17.4* 16.0* 19.6* 18.2*  --  30.4*  NEUTROABS  --  14.2*  --  16.2*  --   --   HGB 6.9* 7.6* 7.2* 5.9* 8.7* 9.1*  HCT 22.9* 23.9* 22.5* 18.9* 26.9* 28.5*  MCV 100.0 98.0 99.1 100.5*  --  96.6  PLT 303 227 261 256  --  238     Basic Metabolic Panel: Recent Labs  Lab 10/16/22 0119 10/17/22 0109 10/18/22 0134 10/19/22 0550 10/20/22 0810  NA 134* 134* 134* 133* 137  K 4.0 5.2* 3.5 3.9 4.3  CL 95* 94* 93* 92* 98  CO2 25 26 27 24 22   GLUCOSE 84 116* 96 97 113*  BUN 46* 95* 39*  66* 107*  CREATININE 3.06* 5.04* 2.74* 4.82* 6.48*  CALCIUM 8.1* 8.9 8.6* 8.7* 8.3*  MG  --   --  1.7  --   --   PHOS  --   --  3.1  --   --      Liver Function Tests: Recent Labs  Lab 10/18/22 0134  ALBUMIN 2.0*      Radiology Studies: No results found.    LOS: 54 days    Joycelyn Das, MD [facility] Available via Epic secure chat 7am-7pm After these hours, please refer to coverage provider listed on [email or web address] 10/21/2022, 2:44 PM

## 2022-10-21 NOTE — Consult Note (Signed)
WOC Nurse wound follow up Wound type:  Stage 4 Pressure Injury; sacrum;  Measurement: 12cm x 10cm x 3.0cm; 100% red, early granulation tissue, palpable bone 2. Unstageable Pressure Injury; left ischium 5cm x 3.5cm x 0.1cm; 100% yellow, adherent slough 3. I was not able to see a pressure injury on the right ischium today; will reassess next week (large heme+ stool over area, pending bath and bed change per RN and NT) 4. Unstageable Pressure Injury: left heel; 3cm x 2cm x 0cm; 100% black Black dry gangrene of the toes of the left foot and left dorsal foot. Wound bed: see above Drainage (amount, consistency, odor) serosanguinous, non purulent  Periwound: intact Dressing procedure/placement/frequency: DC enzymatic debridement ointment to the sacrum; clean at this point. Large defect near anus, wound not be a candidate for NPWT at this time due to incontinence of stool and proximity to anus  Continue Prevalon boots bilaterally especially to offload the left heel, however patient reports he does not like the offloading boot. Causing pain, I have explained rationale for offloading pressure injury.  Continue enzymatic debridement to the left ischium and right ischium.  PT has DCed hydrotherapy for the sacrum; no clinical need for further debridement to this wound at this time.  Low air loss mattress in place for moisture management and pressure redistribution Consider GOC for patient, wounds are extensive and will not heal at this point in patient's current state.  Continue RD for supplementation in the presence of extensive wounds and malnutrition  WOC Nurse team will follow with you and see patient within 10 days for wound assessments.  Please notify WOC nurses of any acute changes in the wounds or any new areas of concern Darren Caldron [facility] MSN, RN,CWOCN, CNS, CWON-AP [phone]

## 2022-10-21 NOTE — Progress Notes (Signed)
Palliative Medicine Progress Note   Patient Name: Albert Harris       Date: 10/21/2022 DOB: January 24, 1956  Age: 67 y.o. MRN#: [id number] Attending Physician: Joycelyn Das, MD Primary Care Physician: [name], NP Admit Date: 08/28/2022    HPI/Patient Profile: 67 y.o. male  with past medical history of ESRD on HD, peripheral vascular disease, chronic systolic CHF with EF 35 to 40%, and COPD.  He presented to [facility] on 08/28/2022 with abdominal pain, nausea, and unable to feel his left foot.  He was found to have a left femoral pseudoaneurysm and was taken to the OR for repair of his distal left external iliac artery.  He required emergent CRRT due to hypotension and hyperkalemia.  Hospitalization has been complicated by MRSA bacteremia secondary to post-op wound infection and recurrent rectal bleeding requiring multiple transfusions.    Palliative Medicine was consulted for goals of care in the setting of ESRD, severe vascular disease, and bleeding.   Subjective: Myself and Dr. Karin Lieu met with patient and family at bedside.   Discussed Albert Harris's very complicated hospital course (53 days) and current clinical status.  We discussed his severe vascular disease, worsening left leg ischemia, and recurrent rectal bleeding. Also discussed his sacral wound, which is worsening despite aggressive treatment and is not likely to ever heal due to his severe vascular disease and poor nutritional status.   Albert Harris and his family understand that unfortunately there are not additional treatment options for the rectal bleeding except for blood transfusions.   We discussed the option to focus on comfort, stop dialysis, and get him home where he could pass with peace and dignity.   After further discussion,  Albert Harris and his family have agreed to move forward with getting him home with hospice. They understand that his prognosis will be short (likely 7-10 days) after stopping dialysis. Discussed plan for 1 more dialysis treatment prior to discharge to hopefull maximize his time and quality of life at home.    Objective:  Physical Exam Vitals reviewed.  Constitutional:      General: He is not in acute distress.    Appearance: He is ill-appearing.  Pulmonary:     Effort: Pulmonary effort is normal.  Skin:    Comments: Left toes and foot necrosis  Neurological:     Mental Status:  He is alert and oriented to person, place, and time.     Motor: Weakness present.               Palliative Medicine Assessment & Plan   Assessment: Active Problems:   Lower GI bleed   ESRD (end stage renal disease) on dialysis (HCC)   Malnutrition of moderate degree   Pseudoaneurysm of femoral artery (HCC)   Decreased functional mobility   Pressure injury of skin   Colonic ulcer   Rectal arterial hemorrhage   Rectal ulcer   Hematochezia   Acute blood loss anemia   MRSA bacteremia    Recommendations/Plan: Patient and family have agreed to home with hospice Plan for dialysis as scheduled tomorrow and tentative discharge Friday  Code Status: DNR/DNI   Prognosis:  < 2 weeks without dialysis  Discharge Planning: Home with Hospice  Care plan was discussed with Dr. Tyson Babinski, RN, TOC, and nephrology  Thank you for allowing the Palliative Medicine Team to assist in the care of this patient.  Thank you for allowing the Palliative Medicine Team to assist in the care of this patient.   Greater than 50%  of this time was spent counseling and coordinating care related to the above assessment and plan.  Total time: 65 minutes   Merry Proud, NP Palliative Medicine   Please contact Palliative Medicine Team phone at [phone] for questions and concerns.  For individual provider, see AMION.

## 2022-10-21 NOTE — Plan of Care (Signed)
  Problem: Education: Goal: Understanding of CV disease, CV risk reduction, and recovery process will improve Outcome: Progressing Goal: Individualized Educational Video(s) Outcome: Progressing   Problem: Activity: Goal: Ability to return to baseline activity level will improve Outcome: Progressing   Problem: Cardiovascular: Goal: Ability to achieve and maintain adequate cardiovascular perfusion will improve Outcome: Progressing Goal: Vascular access site(s) Level 0-1 will be maintained Outcome: Progressing   Problem: Health Behavior/Discharge Planning: Goal: Ability to safely manage health-related needs after discharge will improve Outcome: Progressing   Problem: Education: Goal: Knowledge of prescribed regimen will improve Outcome: Progressing   Problem: Activity: Goal: Ability to tolerate increased activity will improve Outcome: Progressing   Problem: Bowel/Gastric: Goal: Gastrointestinal status for postoperative course will improve Outcome: Progressing   Problem: Clinical Measurements: Goal: Postoperative complications will be avoided or minimized Outcome: Progressing Goal: Signs and symptoms of graft occlusion will improve Outcome: Progressing   Problem: Skin Integrity: Goal: Demonstration of wound healing without infection will improve Outcome: Progressing   Problem: Education: Goal: Knowledge of General Education information will improve Description: Including pain rating scale, medication(s)/side effects and non-pharmacologic comfort measures Outcome: Progressing   Problem: Health Behavior/Discharge Planning: Goal: Ability to manage health-related needs will improve Outcome: Progressing   Problem: Clinical Measurements: Goal: Ability to maintain clinical measurements within normal limits will improve Outcome: Progressing Goal: Will remain free from infection Outcome: Progressing Goal: Diagnostic test results will improve Outcome: Progressing Goal:  Respiratory complications will improve Outcome: Progressing Goal: Cardiovascular complication will be avoided Outcome: Progressing   Problem: Activity: Goal: Risk for activity intolerance will decrease Outcome: Progressing   Problem: Nutrition: Goal: Adequate nutrition will be maintained Outcome: Progressing   Problem: Coping: Goal: Level of anxiety will decrease Outcome: Progressing   Problem: Elimination: Goal: Will not experience complications related to bowel motility Outcome: Progressing Goal: Will not experience complications related to urinary retention Outcome: Progressing   Problem: Pain Managment: Goal: General experience of comfort will improve Outcome: Progressing   Problem: Safety: Goal: Ability to remain free from injury will improve Outcome: Progressing   Problem: Skin Integrity: Goal: Risk for impaired skin integrity will decrease Outcome: Progressing   Problem: Education: Goal: Ability to identify signs and symptoms of gastrointestinal bleeding will improve Outcome: Progressing   Problem: Bowel/Gastric: Goal: Will show no signs and symptoms of gastrointestinal bleeding Outcome: Progressing   Problem: Fluid Volume: Goal: Will show no signs and symptoms of excessive bleeding Outcome: Progressing   Problem: Clinical Measurements: Goal: Complications related to the disease process, condition or treatment will be avoided or minimized Outcome: Progressing   Problem: Activity: Goal: Ability to tolerate increased activity will improve Outcome: Progressing   Problem: Respiratory: Goal: Ability to maintain a clear airway and adequate ventilation will improve Outcome: Progressing   Problem: Role Relationship: Goal: Method of communication will improve Outcome: Progressing

## 2022-10-21 NOTE — Progress Notes (Signed)
Civil engineer, contracting [facility]) Hospital Liaison Note  Referral received for patient/family interest in hospice at home. [facility] liaison spoke with patient's daughter [name] to confirm interest. Interest confirmed.   Plan is for patient to have one last HD session tomorrow then discharge home.   Hospital bed ordered.   Please send patient home with comfort medications/prescriptions at discharge.   Please call with any questions or concerns. Thank you  Dionicio Stall, [facility] Liaison [phone]

## 2022-10-21 NOTE — Progress Notes (Signed)
Physical Therapy Treatment Patient Details Name: Albert Harris MRN: [id number] DOB: 20-Oct-1955 Today's Date: 10/21/2022   History of Present Illness 67 year old male  ER with abdominal pain, nausea, diaphoresis and unable to feel his Lt foot. Hypotensive with lactic acidosis in ER.  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 CRRT 3/22-3/26.  3/22 repair of distal Lt external iliac artery. On 3/30, Hgb drop from 9 to 7 with worsening abdominal pain, CT abd 3/30 shows slightly larger anterior pelvic extraperitoneal hematoma. 4/18 -  massive hemoptysis requiring 4 units PRBC, bedside flex sig with bleeding ulcer, hemostatic gel applied, ETT 4/18-19. PMHx: PAD, ESRD HD, recently hospitalized 3/16-18 for acute on chronic left lower extremity ischemia.    PT Comments    Pt to have Palliative consult later today and does not want increased pain. Limited to bed level exercise, focus on extension stretches to alleviate stiffness and assist in movement. Pt is making little progress towards his goals. Reviewed goals and downgraded based on current level of function.     Recommendations for follow up therapy are one component of a multi-disciplinary discharge planning process, led by the attending physician.  Recommendations may be updated based on patient status, additional functional criteria and insurance authorization.  Follow Up Recommendations  Can patient physically be transported by private vehicle: No    Assistance Recommended at Discharge Frequent or constant Supervision/Assistance  Patient can return home with the following Assist for transportation;Assistance with cooking/housework;Help with stairs or ramp for entrance;A lot of help with walking and/or transfers;A lot of help with bathing/dressing/bathroom   Equipment Recommendations  Other (comment) (TBD; currently would need mechanical lift, hospital bed with air mattress and reclining back wheelchair with roho  cushion and elevating leg rests)    Recommendations for Other Services       Precautions / Restrictions Precautions Precautions: Fall Precaution Comments: L groin wound vac; Per verbal order of Dr. Chestine Spore and Dr. Lenell Antu 4/19 - unrestricted L hip ROM, pt appropriate to move OOB and mobilize as able. Restrictions Weight Bearing Restrictions: Yes RLE Weight Bearing: Weight bearing as tolerated LLE Weight Bearing: Non weight bearing     Mobility  Bed Mobility               General bed mobility comments: deferred due to not wanting pain increase prior to Palliative mtg        Balance       Sitting balance - Comments: defer, pt pain/fatigue limited                                    Cognition Arousal/Alertness: Awake/alert Behavior During Therapy: WFL for tasks assessed/performed Overall Cognitive Status: Within Functional Limits for tasks assessed                                 General Comments: pt to have Palliative consult later this afternoon        Exercises General Exercises - Lower Extremity Ankle Circles/Pumps: AROM, AAROM, 20 reps, Right Quad Sets: AROM, Both, 5 reps Heel Slides: AAROM, Right, 5 reps, Supine Straight Leg Raises: AROM, Right, 5 reps Other Exercises Other Exercises: Shoulder retraction, pectoral stretch x5 Other Exercises: shoulder depression stretchx5 Other Exercises: R heel cord stretch Other Exercises: L knee bend x5    General Comments  Pertinent Vitals/Pain Pain Assessment Pain Assessment: Faces Faces Pain Scale: Hurts whole lot Pain Location: back/bottom and LLE at rest and with repositioning Pain Descriptors / Indicators: Grimacing, Guarding, Sore, Constant, Discomfort, Burning, Moaning Pain Intervention(s): Limited activity within patient's tolerance     PT Goals (current goals can now be found in the care plan section) Acute Rehab PT Goals Patient Stated Goal: Less pain so I can get  better and go home. PT Goal Formulation: With patient/family Time For Goal Achievement: 10/19/22 Potential to Achieve Goals: Fair Progress towards PT goals: Not progressing toward goals - comment;Goals downgraded-see care plan    Frequency    Min 2X/week      PT Plan Current plan remains appropriate (has agreed to Palliative mtg)       AM-PAC PT "6 Clicks" Mobility   Outcome Measure  Help needed turning from your back to your side while in a flat bed without using bedrails?: A Lot Help needed moving from lying on your back to sitting on the side of a flat bed without using bedrails?: Total Help needed moving to and from a bed to a chair (including a wheelchair)?: Total Help needed standing up from a chair using your arms (e.g., wheelchair or bedside chair)?: Total Help needed to walk in hospital room?: Total Help needed climbing 3-5 steps with a railing? : Total 6 Click Score: 7    End of Session   Activity Tolerance: Patient limited by pain (does not want increased pain during Palliative consult) Patient left: in bed;with call bell/phone within reach;with family/visitor present;Other (comment) (heels floated) Nurse Communication: Mobility status;Other (comment) (hematochezia, needs wound/hygiene assist) PT Visit Diagnosis: Other abnormalities of gait and mobility (R26.89);Muscle weakness (generalized) (M62.81);Difficulty in walking, not elsewhere classified (R26.2);Pain     Time: 1340-1420 PT Time Calculation (min) (ACUTE ONLY): 40 min  Charges:  $Therapeutic Exercise: 23-37 mins                     Deovion Batrez B. Beverely Risen PT, DPT Acute Rehabilitation Services Please use secure chat or  Call Office [phone]    Elon Alas [facility] 10/21/2022, 2:32 PM

## 2022-10-21 NOTE — Progress Notes (Cosign Needed Addendum)
[facility] Progress Note   Subjective:    Seen and examined patient at bedside. Family also at bedside. Tolerated yesterday's HD with net UF 1L. S/p 2 units PRBCs for Hgb 5.9. Hgb now 9.1. Reviewed last Palliative Care note: plan for family meeting this afternoon. Next HD 5/16.  Objective Vitals:   10/21/22 0305 10/21/22 0745 10/21/22 1054 10/21/22 1155  BP: (!) 116/38 132/60 (!) 157/58 (!) 150/70  Pulse: 79  82 90  Resp: 19   20  Temp: 97.6 F (36.4 C) 98.6 F (37 C) 98 F (36.7 C)   TempSrc: Oral Oral Oral   SpO2: 100%  96% 92%  Weight:      Height:       Physical Exam General: Chronically ill and frail man. NAD. Room air. Heart: RRR; no murmur Lungs: CTA anteriorly Abdomen: soft Extremities: No LE edema; ischemic/gangrene L toes; wound vac noted L groin Dialysis Access: TDC, L forearm AVG + bruit  Filed Weights   10/19/22 0439 10/20/22 0700 10/21/22 0228  Weight: 60.5 kg 60.5 kg 68.5 kg    Intake/Output Summary (Last 24 hours) at 10/21/2022 1402 Last data filed at 10/21/2022 0224 Gross per 24 hour  Intake 500 ml  Output --  Net 500 ml    Additional Objective Labs: Basic Metabolic Panel: Recent Labs  Lab 10/18/22 0134 10/19/22 0550 10/20/22 0810  NA 134* 133* 137  K 3.5 3.9 4.3  CL 93* 92* 98  CO2 27 24 22   GLUCOSE 96 97 113*  BUN 39* 66* 107*  CREATININE 2.74* 4.82* 6.48*  CALCIUM 8.6* 8.7* 8.3*  PHOS 3.1  --   --    Liver Function Tests: Recent Labs  Lab 10/18/22 0134  ALBUMIN 2.0*   No results for input(s): "LIPASE", "AMYLASE" in the last 168 hours. CBC: Recent Labs  Lab 10/17/22 0931 10/18/22 0134 10/19/22 0550 10/20/22 0810 10/20/22 1905 10/21/22 1228  WBC 17.4* 16.0* 19.6* 18.2*  --  30.4*  NEUTROABS  --  14.2*  --  16.2*  --   --   HGB 6.9* 7.6* 7.2* 5.9* 8.7* 9.1*  HCT 22.9* 23.9* 22.5* 18.9* 26.9* 28.5*  MCV 100.0 98.0 99.1 100.5*  --  96.6  PLT 303 227 261 256  --  238   Blood Culture    Component Value  Date/Time   SDES TISSUE 09/09/2022 1453   SPECREQUEST VESSEL PT ON VANC ANCEF 09/09/2022 1453   CULT  09/09/2022 1453    RARE STAPHYLOCOCCUS AUREUS SUSCEPTIBILITIES PERFORMED ON PREVIOUS CULTURE WITHIN THE LAST 5 DAYS. NO ANAEROBES ISOLATED Performed at [facility], [address], [place], [place] [address]    REPTSTATUS 09/14/2022 FINAL 09/09/2022 1453    Cardiac Enzymes: Recent Labs  Lab 10/19/22 0550  CKTOTAL 22*   CBG: No results for input(s): "GLUCAP" in the last 168 hours. Iron Studies: No results for input(s): "IRON", "TIBC", "TRANSFERRIN", "FERRITIN" in the last 72 hours. Lab Results  Component Value Date   INR 1.2 08/12/2022   INR 1.3 (H) 06/29/2022   INR 1.3 (H) 06/28/2022   Studies/Results: No results found.  Medications:  sodium chloride 0 mL/hr at 09/22/22 0757   DAPTOmycin (CUBICIN) 600 mg in sodium chloride 0.9 % IVPB 600 mg (10/20/22 1803)   DAPTOmycin (CUBICIN) 750 mg in sodium chloride 0.9 % IVPB 750 mg (10/17/22 1810)   sodium chloride      (feeding supplement) PROSource Plus  30 mL Oral TID WC   sodium  chloride   Intravenous Once   acetaminophen  650 mg Oral Q6H   ascorbic acid  250 mg Oral BID   budesonide  9 mg Oral Daily   calcium acetate  667 mg Oral TID WC   Chlorhexidine Gluconate Cloth  6 each Topical Q0600   collagenase   Topical Daily   darbepoetin (ARANESP) injection - DIALYSIS  200 mcg Subcutaneous Q Wed-1800   liver oil-zinc oxide   Topical TID   mesalamine  1,000 mg Rectal QHS   methocarbamol  500 mg Oral TID   metoprolol succinate  25 mg Oral Daily   multivitamin  1 tablet Oral QHS   nutrition supplement (JUVEN)  1 packet Oral BID BM   pantoprazole  40 mg Oral BID   polyethylene glycol  17 g Oral Daily   rosuvastatin  10 mg Oral Daily   sodium chloride flush  10-40 mL Intracatheter Q12H   vitamin A  10,000 Units Oral Daily    Dialysis Orders: SW TTS 4h   350/800  64.5kg  new TDC (RFA AVG clotted)  Heparin none  -  venofer 50mg  IV weekly - rocaltrol 0.25 mcg po tiw - phoslo 2 ac tid - no esa, last Hb 11.0  Assessment/Plan: Recurrent lower GIB: S/p colonoscopy 09/15/22, then repeat sigmoidoscopy 4/11. Several bleeding ulcers addressed.  Path came back + Crohn's - started on mesalamine by GI. Brisk re-bleed 4/18, took again for emergent sigmoidoscopy with hemostatic gel applied to bleeding ulcer. Then bled again requiring suturing of bleeding ulcers 4/20 by Dr. Magnus Ivan. Hgb 8.8 -> 6.9 on 5/11 - given another 1U blood. S/p 2 units PRBCs for Hgb 5.9 5/14-Hgb now 9.1. 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 S/p iliofemoral bypass, removal of infected patch with sartorius muscle flap 09/09/22. Ischemic changes to L 3rd-5th toes persist. Continue to monitor. 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 Karin Lieu in the upcoming weeks. Sacral wound: Non-healing and progressing in size per VVS. Followed by [facility]. 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 CVA on MRI. Neuro signed off.  ESRD: S/p short-term CRRT 3/23- 3/26, now back on iHD on TTS schedule. Next HD 5/16 using TDC.  BP/volume: No LE edema, below prior EDW. Anemia (ESRD + ABLA): Last transfused 2U on 5/14 (total of 28U this admit). Also on Aranesp q Wed (max dose).  Secondary HPTH: CorrCa high, phos at goal. Holding VDRA, Phoslo dose has been reduced. Dialysis access: AVG clotted 3/27, went for declot by VVS and then re-clotted. VVS placed R TDC on 4/1 but there is a strong bruit in the AVG 5/7 - attempted and unable to cannulate on 5/9. Use catheter for now. Hypoalbuminemia - Alb 1.7, in hospital > 40 days. On supplements. Goals of care: Palliative care has revisited, see their notes. Pt previously had not been responsive to suggestions of hospice. Finally changed to DNR as of 5/11, patient is still desiring HD  and other full scope for now but family is considering. Plan for family meeting this afternoon.  Albert Holmes, NP [facility] 10/21/2022,2:02 PM  LOS: 54 days

## 2022-10-21 NOTE — Plan of Care (Signed)
Plan of care reviewed.  Problem: Cardiovascular: critical ischemic left foot. Black dry gangrene on left toes. Goal: Ability to achieve and maintain adequate cardiovascular perfusion will improve Outcome: Not progressing, plan for  below knee amputation per vascular surgeon noted.    Problem: Cardiovascular: s/p removal of infected left femoral patch, iliofemoral bypass with muscle flap 09/09/22 Goal: Vascular access site(s) Level 0-1 will be maintained Outcome: Progressing: wound vac was discontinued 2 days ago. Left groin wound is now clean and dry, foam and AquaCel dressing changed daily, foam dressing can change q 3 days or PRN if needed. Follow up by wound care specialist.    Problem: Activity: limit mobility. Goal: Ability to return to baseline activity level will improve. Ability to tolerate increased activity will improve Outcome: Not progressing: Pt is dependent of care, require two assist  with reposition on the bed. Both legs weakness with poor tolerated to repositioning. On low air rotator mattress bed.   Problem: Skin Integrity: large unstageable sacral wound with poor process of wound healing Pt is frequently having large bowel incontinent with bloody stool.  Goal: Demonstration of wound healing without infection will improve Outcome: Not progressing: Pt is followed by wound care specialist. Wet to dry gauze dressing with Santyl at wound base dressing need to be changed BID. Liver oil-zinc Oxide applied BID/PRN under perineal and buttock area.       Filiberto Pinks, RN

## 2022-10-21 NOTE — TOC Progression Note (Signed)
Transition of Care (TOC) - Progression Note  Donn Pierini RN, BSN Transitions of Care Unit 4E- RN Case Manager See Treatment Team for direct phone #   Patient Details  Name: Albert Harris MRN: [id number] Date of Birth: 04-07-1956  Transition of Care [facility]) CM/SW Contact  Zenda Alpers, Lenn Sink, RN Phone Number: 10/21/2022, 3:39 PM  Clinical Narrative:    Consult received for Home Hospice needs- CM spoke with pt and family at bedside Choice offered Per CMS guidelines from [employer] website with star ratings (copy placed in shadow chart) for Home Hospice agency- after review of list family has selected [facility].   Address, phone # and PCP all confirmed, per family pt will transport home via EMS.  DME needs reviewed- pt will need hospital bed.   Call made to [facility] liaison- Danford Bad- referral for home hospice pending review- Shanita to reach out to daughter [name] who is primary contact.   Plan is for pt to have HD here in am- and then hopeful for d/c home in the afternoon pending DME delivery and hospice arrangements.    Expected Discharge Plan: Home w Hospice Care Barriers to Discharge: Continued Medical Work up  Expected Discharge Plan and Services In-house Referral: Clinical Social Work   Post Acute Care Choice: Hospice Living arrangements for the past 2 months: Single Family Home                 DME Arranged: Hospice Equipment Package Others DME Agency: [facility] Date DME Agency Contacted: 10/21/22   Representative spoke with at DME Agency: Per [facility] Agency: [facility] Date [facility] Agency Contacted: 10/21/22 Time HH Agency Contacted: [id number] Representative spoke with at [facility] Agency: [place]   Social Determinants of Health (SDOH) Interventions SDOH Screenings   Food Insecurity: No Food Insecurity (09/29/2022)  Recent Concern: Food Insecurity - Food Insecurity Present (08/22/2022)   Housing: Low Risk  (09/29/2022)  Transportation Needs: No Transportation Needs (09/29/2022)  Recent Concern: Transportation Needs - Unmet Transportation Needs (08/22/2022)  Utilities: Not At Risk (09/29/2022)  Alcohol Screen: Low Risk  (09/12/2019)  Depression (PHQ2-9): High Risk (05/07/2022)  Financial Resource Strain: Low Risk  (09/12/2019)  Stress: No Stress Concern Present (09/12/2019)  Tobacco Use: Medium Risk (09/29/2022)    Readmission Risk Interventions    08/24/2022   11:27 AM 08/14/2022   10:07 AM 07/24/2022   11:52 AM  Readmission Risk Prevention Plan  Transportation Screening Complete Complete Complete  Medication Review (RN Care Manager) Referral to Pharmacy Referral to Pharmacy Complete  PCP or Specialist appointment within 3-5 days of discharge  Complete Complete  HRI or Home Care Consult Complete Patient refused Complete  SW Recovery Care/Counseling Consult Complete Complete Patient refused  Palliative Care Screening Not Applicable Not Applicable Not Applicable  Skilled Nursing Facility Not Applicable Not Applicable Not Applicable

## 2022-10-22 ENCOUNTER — Inpatient Hospital Stay (HOSPITAL_COMMUNITY): Payer: Medicare Other

## 2022-10-22 HISTORY — PX: IR REMOVAL TUN CV CATH W/O FL: IMG2289

## 2022-10-22 LAB — BASIC METABOLIC PANEL
Anion gap: 16 — ABNORMAL HIGH (ref 5–15)
BUN: 75 mg/dL — ABNORMAL HIGH (ref 8–23)
CO2: 24 mmol/L (ref 22–32)
Calcium: 8.6 mg/dL — ABNORMAL LOW (ref 8.9–10.3)
Chloride: 95 mmol/L — ABNORMAL LOW (ref 98–111)
Creatinine, Ser: 4.8 mg/dL — ABNORMAL HIGH (ref 0.61–1.24)
GFR, Estimated: 12 mL/min — ABNORMAL LOW (ref >60)
Glucose, Bld: 87 mg/dL (ref 70–99)
Potassium: 4.8 mmol/L (ref 3.5–5.1)
Sodium: 135 mmol/L (ref 135–145)

## 2022-10-22 LAB — TYPE AND SCREEN
ABO/RH(D): O POS
Antibody Screen: NEGATIVE
Unit division: 0
Unit division: 0

## 2022-10-22 LAB — CBC
HCT: 21.5 % — ABNORMAL LOW (ref 39.0–52.0)
Hemoglobin: 6.6 g/dL — CL (ref 13.0–17.0)
MCH: 29.7 pg (ref 26.0–34.0)
MCHC: 30.7 g/dL (ref 30.0–36.0)
MCV: 96.8 fL (ref 80.0–100.0)
Platelets: 222 10*3/uL (ref 150–400)
RBC: 2.22 MIL/uL — ABNORMAL LOW (ref 4.22–5.81)
RDW: 17 % — ABNORMAL HIGH (ref 11.5–15.5)
WBC: 18.9 10*3/uL — ABNORMAL HIGH (ref 4.0–10.5)
nRBC: 0 % (ref 0.0–0.2)

## 2022-10-22 LAB — BPAM RBC
Blood Product Expiration Date: 202406142359
Blood Product Expiration Date: 202406142359
ISSUE DATE / TIME: 202405161030
Unit Type and Rh: 5100

## 2022-10-22 LAB — PREPARE RBC (CROSSMATCH)

## 2022-10-22 LAB — MAGNESIUM: Magnesium: 1.8 mg/dL (ref 1.7–2.4)

## 2022-10-22 MED ORDER — MESALAMINE 1000 MG RE SUPP: 1000.0000 mg | Freq: Every day | RECTAL | 2 refills | Status: DC

## 2022-10-22 MED ORDER — JUVEN PO PACK: 1.0000 | PACK | Freq: Two times a day (BID) | ORAL | 0 refills | Status: DC

## 2022-10-22 MED ORDER — ASCORBIC ACID 250 MG PO TABS: 250.0000 mg | ORAL_TABLET | Freq: Two times a day (BID) | ORAL | 0 refills | Status: DC

## 2022-10-22 MED ORDER — METHOCARBAMOL 500 MG PO TABS: 500.0000 mg | ORAL_TABLET | Freq: Three times a day (TID) | ORAL | 0 refills | Status: AC

## 2022-10-22 MED ORDER — POLYETHYLENE GLYCOL 3350 17 G PO PACK: 17.0000 g | PACK | Freq: Every day | ORAL | 0 refills | Status: DC

## 2022-10-22 MED ORDER — SODIUM CHLORIDE 0.9% IV SOLUTION: Freq: Once | INTRAVENOUS | Status: DC

## 2022-10-22 MED ORDER — PANTOPRAZOLE SODIUM 40 MG PO TBEC: 40.0000 mg | DELAYED_RELEASE_TABLET | Freq: Two times a day (BID) | ORAL | 0 refills | Status: DC

## 2022-10-22 MED ORDER — PROSOURCE PLUS PO LIQD: 30.0000 mL | Freq: Three times a day (TID) | ORAL | 0 refills | Status: DC

## 2022-10-22 MED ORDER — RENA-VITE PO TABS: 1.0000 | ORAL_TABLET | Freq: Every day | ORAL | 0 refills | Status: DC

## 2022-10-22 MED ORDER — OXYCODONE-ACETAMINOPHEN 5-325 MG PO TABS: 1.0000 | ORAL_TABLET | ORAL | 0 refills | Status: DC | PRN

## 2022-10-22 MED ORDER — VITAMIN A 3 MG (10000 UNIT) PO CAPS: 10000.0000 [IU] | ORAL_CAPSULE | Freq: Every day | ORAL | 0 refills | Status: DC

## 2022-10-22 MED ORDER — BUDESONIDE 3 MG PO CPEP: 9.0000 mg | ORAL_CAPSULE | Freq: Every day | ORAL | 2 refills | Status: DC

## 2022-10-22 NOTE — Progress Notes (Signed)
Patient is transitioning to home hospice and his last hemodialysis treatment was today. Patient will need his TDC removed. Discussed with both Palliative Care Team and IR. Order placed for IR to remove [facility] today.  Salome Holmes, NP [facility]

## 2022-10-22 NOTE — Procedures (Signed)
.  Interventional Radiology Procedure Note  PROCEDURE SUMMARY:  Successful removal of tunneled catheter.  No complications.   EBL = trace  Please see full dictation in imaging section of Epic for procedure details.  Agripina Guyette S Marit Goodwill     

## 2022-10-22 NOTE — Discharge Summary (Signed)
Physician Discharge Summary  Albert Harris ZOX:[id number] DOB: 03-Oct-1955 DOA: 08/28/2022  PCP: [name], NP  Admit date: 08/28/2022 Discharge date: 10/22/2022  Admitted From: Home  Discharge disposition: Home hospice   Recommendations for Outpatient Follow-Up:   Follow-up with hospice care provider as outpatient.  Discharge Diagnosis:   Active Problems:   Lower GI bleed   Acute blood loss anemia   ESRD (end stage renal disease) on dialysis (HCC)   Pseudoaneurysm of femoral artery (HCC)   Rectal arterial hemorrhage   Rectal ulcer   Hematochezia   MRSA bacteremia   Malnutrition of moderate degree   Decreased functional mobility   Pressure injury of skin   Colonic ulcer   Discharge Condition: Improved.  Diet recommendation:   Regular.  Wound care: None.  Code status: Full.   History of Present Illness:   Patient is a 67 years old patient with past medical history of ESRD on HD T/Thursday, Saturday, peripheral vascular disease, COPD, essential hypertension, chronic systolic congestive heart failure with ejection fraction 35 to 40%, initially presented to the hospital with with abdominal pain, nausea and diaphoresis. Patient was initially hypotensive with a lactic acidosis and was admitted to ICU,with a pulseless left lower extremity was found to have femoral pseudoaneurysm. Blood culture came back with MRSA bacteremia. TEE neg for vegetations.   Patient had prolonged and complicated hospitalization course as below,   08/29/22: Admitted to ICU for septic shock. s/p OR with Dr. Karin Lieu for left femoral exploration with pseudoaneurysm repair,hematoma evacuation>showed dehiscence at proximal aspect of bovine pericardial patch at the suture line. Emergent CRRT for severe hyperkalemia   09/08/22 Transferred to [facility] service    09/09/22 CTA for bleeding on left groin: showed Thrombosis of LEFT CFA pseudoaneurysm with new 3.5 cm filling defect, consistent with arterial  thrombus, extending from the PSA neck and into the distal CFA. This is at risk for distal embolization and potential acute LEFT lower extremity ischemia. New 8 mm distal LEFT EIA pseudoaneurysm, medial to the stent distal landing zone. Mild interval enlargement of retroperitoneal hematoma, in a craniocaudal dimension measuring 12.2 cm (previously 11.2 cm).No CTA evidence of active extravasation. Patent RIGHT femoral-to-popliteal graft and bilateral iliac stents.Severe burden of splanchnic arterial atherosclerosis, at risk for chronic mesenteric ischemia.   09/09/22: urgent OR:S/P left common iliac to profunda femoris bypass with ipsilateral nonreversed greater saphenous vein in anatomic tunnel, left common iliac artery angioplasty and stenting, left greater saphenous vein harvest, left sartorius muscle of left groin, left lower extremity angiography with first-order cannulation   09/12/22 am: 2 BRBPR> s/p Flex sig>showed 5 rectal ulcers, not actively bleeding, advised to avoid constipation, added Miralax BID   09/13/22 AM: Had 2 large bloody BM ~ 500cc> got 1 unit PRBC.   4/11 repeat sigmoidoscopy-several bleeding ulcers, pathology came back positive for Crohn's and started on mesalamine,    4/18: massive rebleeding-not a candidate for IR due to severe PVD s/p emergent sigmoidoscopy with hemostatic gel applied to bleeding ulcer   09/26/22: s/p suturing of bleeding ulcers by Dr. Magnus Ivan in OR.   10/13/22: Patient having progressive ischemic changes to the toes no plan for intervention at this time is to improve clinically prior to elective AKA and continuing on wound care hydrotherapy.   10/18/12 Patient did have episodes of rectal bleed.   10/20/12 Patient continues to have rectal bleeding and received PRBC.  Palliative care was reconsulted for goals of care.  Hospital Course:   Following conditions were  addressed during hospitalization as listed below,  Acute blood loss anemia/Recurrent hematochezia  from anal/rectal ulcer- Crohns' disease: Status post multiple sigmoidoscopy, EGD and oversewing of ulcers and packing and was treated with Gelfoam.  GI and general surgery has signed off.  Continue budesonide 9 mg daily, per GI cont Canasa PR x 14 doses from 5/9. S/p multiple transfusion ~ 42 units, continue to monitor and transfuse for less than 7 g.  Patient has received multiple units of packed RBC during hospitalization and continues to have episodes of rectal bleed.  Palliative care was consulted and at this time patient has been transitioned to hospice level of care.     Severe PVD Infected left femoral patch S/p ileofemoral bypass w/ sartorius muscle flap 4/3 Retroperitoneal hemorrhage Ischemic toes: Patient does have complicated vascular/surgical history. S/p left groin hematoma evacuation, primary repair of the distal external iliac artery. Further complicated on 4/3 by left groin bleeding with new pseudoaneurysm and arterial thrombus. S/p iliofemoral bypass and removal of infected patch with sartorius muscle flap. Having ongoing progressive ischemic changes to the toes> closely followed the patient and at this time patient has been transitioned to hospice level of care.    MRSA/Enterococcus faecium bacteremia secondary to postop wound infection: Status post removal of infected patch.  Seen by ID>4/8 recommending daptomycin for 6 weeks  last day 10/23/22,.  At this time patient will be transition to hospice level of care.  Sacral pressure injury POA, unstageable/deep.  Patient received hydrotherapy during hospitalization.  Will transition to hospice level of care on discharge.  ESRD on HD TTS, s/p short-term CRRT on 3/23 - 3/26, nephrology was on board.  RIJ TC +.  AVG clotted 3/27 status post declot by VVS then clotted again> s/p IR TDC placed 4/1, having bruit in AVG 5/7.  Patient received hemodialysis during hospitalization.  At this time transition to hospice level of care.   Anemia ESRD  and acute blood loss anemia  Continues to have low hemoglobin.  Latest hemoglobin of 6.6.  Patient received 1 unit of packed RBC prior to hospice transition.   Chronic systolic CHF EF 35-40% TEE 4/1: EF 50-55%.  Severe LVH.  Compensated at this time.     Pulmonary hypertension-stable.   Hypertension: Marginally low blood pressure at this time.   Hyperlipidemia:    Acute metabolic encephalopathy:  In the context of uremia and metabolic acidosis and opiates.  Alert awake and Communicative   Incidental lacunar stroke:4 mm acute infarct in the right subinsular white matter on MRI 09/04/2022. Neurology completed stroke workup echo EF 45% carotid Doppler bilateral ICA 40-59%, LDL 45, HbA1c 5.1.  PTA on Plavix.  At this time unable to use antiplatelet due to recurrent bleeding.   COPD: Continue bronchodilators as needed..   Pain management/uncontrolled back pain: On Tylenol Dilaudid tramadol for pain.  Continue narcotics on discharge.  Nutrition Status: Nutrition Problem: Moderate Malnutrition (in the context of chronic illness (ESRD)) Etiology: poor appetite Signs/Symptoms: moderate fat depletion, severe muscle depletion Interventions: Refer to RD note for recommendations, Liberalize Diet, Nepro shake, MVI   Pressure Injury 08/28/22 Sacrum Medial Unstageable - Full thickness tissue loss in which the base of the injury is covered by slough (yellow, tan, gray, green or brown) and/or eschar (tan, brown or black) in the wound bed. Sacral wound with eschar coveri (Active)  08/28/22 2228  Location: Sacrum  Location Orientation: Medial  Staging: Unstageable - Full thickness tissue loss in which the base of the injury is  covered by slough (yellow, tan, gray, green or brown) and/or eschar (tan, brown or black) in the wound bed.  Wound Description (Comments): Sacral wound with eschar covering wound bed  Present on Admission: Yes     Pressure Injury 09/04/22 Heel Left Deep Tissue Pressure Injury -  Purple or maroon localized area of discolored intact skin or blood-filled blister due to damage of underlying soft tissue from pressure and/or shear. (Active)  09/04/22 1200  Location: Heel  Location Orientation: Left  Staging: Deep Tissue Pressure Injury - Purple or maroon localized area of discolored intact skin or blood-filled blister due to damage of underlying soft tissue from pressure and/or shear.  Wound Description (Comments):   Present on Admission: No     Pressure Injury 10/10/22 Thigh Posterior;Proximal;Right Stage 2 -  Partial thickness loss of dermis presenting as a shallow open injury with a red, pink wound bed without slough. painful, bleeding (Active)  10/10/22 0500  Location: Thigh  Location Orientation: Posterior;Proximal;Right  Staging: Stage 2 -  Partial thickness loss of dermis presenting as a shallow open injury with a red, pink wound bed without slough.  Wound Description (Comments): painful, bleeding  Present on Admission:      Pressure Injury 10/10/22 Thigh Left;Posterior;Proximal Stage 2 -  Partial thickness loss of dermis presenting as a shallow open injury with a red, pink wound bed without slough. pink,skin pilled off (Active)  10/10/22 0500  Location: Thigh  Location Orientation: Left;Posterior;Proximal  Staging: Stage 2 -  Partial thickness loss of dermis presenting as a shallow open injury with a red, pink wound bed without slough.  Wound Description (Comments): pink,skin pilled off  Present on Admission:     Goals of care Prognosis is poor.  Critical care on board and had family meeting yesterday.  Plan for transition to hospice at home on 10/22/2022  Medical Consultants:   Nephrology Palliative care Vascular surgery  Procedures:    Left femoral exploration with pseudo aneurysm repair hematoma evacuation on 08/29/2022 Emergency CRRT PRBC transfusion multiple units Flexible sigmoidoscopy 09/12/2022 Emergent sigmoidoscopy with hemostatic gel  application on 09/23/2020  suturing of the bleeding ulcer on 09/26/2022  Subjective:   .  Denies any pain, nausea, vomiting.  Seen during hemodialysis.  Discharge Exam:   Vitals:   10/22/22 0930 10/22/22 1000  BP: 107/70 114/74  Pulse: 87 93  Resp: 17 16  Temp:    SpO2: 96%    Vitals:   10/22/22 0830 10/22/22 0900 10/22/22 0930 10/22/22 1000  BP: 105/66 92/63 107/70 114/74  Pulse: 87 90 87 93  Resp: 16 19 17 16   Temp:      TempSrc:      SpO2: 100% 100% 96%   Weight:      Height:       Body mass index is 23.54 kg/m.  General: Alert awake, not in obvious distress, appears weak and deconditioned. HENT: pupils equally reacting to light, pallor noted.. Oral mucosa is moist.  Chest:  Clear breath sounds.  Diminished breath sounds bilaterally. No crackles or wheezes.  CVS: S1 &S2 heard. No murmur.  Regular rate and rhythm. Abdomen: Soft, nontender, nondistended.  Bowel sounds are heard.   Extremities: Black discoloration of the left third fourth and fifth toes. Psych: Alert, awake and Communicative. CNS:  No cranial nerve deficits.  Power equal in all extremities.   Skin: Warm and dry.  Unstageable decubitus ulceration on the sacrum, black discoloration of the left third fourth and fifth toes.  The  results of significant diagnostics from this hospitalization (including imaging, microbiology, ancillary and laboratory) are listed below for reference.     Diagnostic Studies:   DG Abd Portable 1V  Result Date: 08/29/2022 CLINICAL DATA:  Orogastric tube placement EXAM: PORTABLE ABDOMEN - 1 VIEW COMPARISON:  Portable exam 1228 hours compared to 1040 hours FINDINGS: Tip of nasogastric tube projects over distal gastric antrum/pylorus. Lung bases clear. Nonobstructive bowel gas pattern. IMPRESSION: Tip of nasogastric tube projects over distal gastric antrum/pylorus. Electronically Signed   By: Ulyses Southward M.D.   On: 08/29/2022 12:45   DG Abd Portable 1V  Result Date:  08/29/2022 CLINICAL DATA:  Orogastric tube placement EXAM: PORTABLE ABDOMEN - 1 VIEW COMPARISON:  08/12/2022 CT abdomen FINDINGS: The orogastric tube distal tip is in the stomach body with side port in the gastric fundus. Borderline enlargement of the cardiopericardial silhouette. Formed stool in the visualized colon. Mild lumbar spondylosis. Enlargement of the cardiopericardial silhouette IMPRESSION: 1. Orogastric tube tip is in the stomach body with side port in the gastric fundus. 2. Borderline enlargement of the cardiopericardial silhouette. Electronically Signed   By: Gaylyn Rong M.D.   On: 08/29/2022 10:52   DG Chest Portable 1 View  Result Date: 08/28/2022 CLINICAL DATA:  Central line placement EXAM: PORTABLE CHEST 1 VIEW COMPARISON:  08/12/2022 FINDINGS: Right IJ line with tip at the upper SVC. Artifact from EKG leads. Nipple shadows asymmetric to the left, confirmed on recent abdominal CT covering the lung bases. There is no edema, consolidation, effusion, or pneumothorax. Normal heart size. IMPRESSION: Right IJ line without complicating feature. Electronically Signed   By: Tiburcio Pea M.D.   On: 08/28/2022 21:41   CT Angio Aortobifemoral W and/or Wo Contrast  Result Date: 08/28/2022 CLINICAL DATA:  Claudication or leg ischemia EXAM: CT ANGIOGRAPHY OF ABDOMINAL AORTA WITH ILIOFEMORAL RUNOFF TECHNIQUE: Multidetector CT imaging of the abdomen, pelvis and lower extremities was performed using the standard protocol during bolus administration of intravenous contrast. Multiplanar CT image reconstructions and MIPs were obtained to evaluate the vascular anatomy. RADIATION DOSE REDUCTION: This exam was performed according to the departmental dose-optimization program which includes automated exposure control, adjustment of the mA and/or kV according to patient size and/or use of iterative reconstruction technique. CONTRAST:  85mL OMNIPAQUE IOHEXOL 350 MG/ML SOLN COMPARISON:  CT 08/12/2022 and  previous FINDINGS: VASCULAR Aorta: Extensive partially calcified atheromatous plaque throughout. No aneurysm, dissection, or stenosis. 4 mm penetrating atheromatous ulcer along the left posterolateral wall in the infrarenal segment. Celiac: Calcified ostial plaque resulting in short segment stenosis of moderate severity, atheromatous but patent distally. SMA: Significant partially calcified atheromatous plaque extending from the ostium approximally 2.4 cm, with progression to at least moderately severe stenosis since 01/01/2022, with continued patency distally with classic distal branch anatomy. Renals: Single left, with near occlusive ostial plaque extending over length of at least 1.2 cm, patent but atheromatous distally. Single right, with heavily calcified atheromatous plaque through its length , of likely hemodynamic significance. IMA: Atheromatous but patent. RIGHT Lower Extremity Inflow: Common iliac proximal stent patent, distal stent with mild residual/recurrent narrowing at its proximal end, patent across the bifurcation. Internal iliac occluded at its origin External iliac overlapping stents through nearly its length, with moderate residual/recurrent stenosis at the distal margin at the common femoral artery. Outflow: Common femoral is mildly atheromatous but patent distally. Deep femoral branches patent proximally, segmentally occluded distally. SFA proximal occlusion extending throughz its length. Patent fem-pop graft, with some retrograde filling of the proximal  native popliteal artery. Runoff: Venous opacification in the proximal calf limited evaluation of proximal tibial runoff. Scattered atheromatous calcifications and poor enhancement in the distal calf limiting assessment of distal trifurcation runoff. LEFT Lower Extremity Inflow: Common iliac patent stents through its length Internal iliac origin occlusion External iliac patent stent through its length. Outflow: Common femoral has 1.1 cm  thin-necked pseudoaneurysm from its proximal segment, patent distally. Deep femoral branches patent SFA origin occlusion obtaining throughout its length despite presence of stents. Popliteal is reconstituted proximally by collaterals, patent but moderately atheromatous through its distal length. Runoff: Atheromatous but apparently contiguous three-vessel tibial runoff to above the ankle, with extensive vascular calcifications and limited enhancement limiting assessment across the ankle and into the foot. Veins: No obvious venous abnormality within the limitations of this arterial phase study. Review of the MIP images confirms the above findings. NON-VASCULAR Lower chest: No pleural or pericardial effusion. Visualized lung bases clear. Hepatobiliary: No focal liver abnormality is seen. No gallstones, gallbladder wall thickening, or biliary dilatation. Pancreas: Unremarkable. No pancreatic ductal dilatation or surrounding inflammatory changes. Spleen: Normal in size without focal abnormality. 2 cm probable splenule abutting the pancreatic tail, stable since earliest available scan 06/18/2020. Adrenals/Urinary Tract: No adrenal mass. Marked bilateral renal parenchymal atrophy with extensive renal arterial branch calcifications. No hydronephrosis or mass. Urinary bladder is nondistended. Stomach/Bowel: Stomach is partially distended by gas and fluid, no acute findings. Small bowel is decompressed. Normal appendix. The colon is incompletely distended, with scattered diverticula at the descending/sigmoid junction, no adjacent inflammatory changes. Lymphatic: No abdominal or pelvic adenopathy. Reproductive: Mild prostate enlargement. Other: Subcutaneous gas bubbles overlying left common femoral vessels. There is a deep subcutaneous hematoma overlying the left common femoral vessels with extension into the anterior extraperitoneal space of the pelvis, dominant component measuring 11.6 x 6.3 cm,, and an 8.3 cm right-sided  component extending along the pelvic sidewall. No active extravasation is identified. No ascites or hemoperitoneum. No free air. Left pelvic phleboliths. Musculoskeletal: No acute findings. IMPRESSION: 1. 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 2. New anterior pelvic extraperitoneal hematoma measuring up to 11.6 cm diameter. No active extravasation evident. 3. Long segment LEFT SFA occlusion. Critical Value/emergent results were discussed by telephone at the time of interpretation on 08/28/2022 at 7:35 pm with provider [facility] , who verbally acknowledged these results. 4. Residual/recurrent stenosis at the distal margin of the RIGHT external iliac stent. 5. Patent RIGHT fem-pop graft 6. Patent LEFT iliac stents. 7. 4 mm penetrating atheromatous ulcer in the infrarenal abdominal aorta. 8. Colonic diverticulosis. 9. Aortic Atherosclerosis (ICD10-I70.0). Electronically Signed   By: Corlis Leak M.D.   On: 08/28/2022 19:40     Labs:   Basic Metabolic Panel: Recent Labs  Lab 10/17/22 0109 10/18/22 0134 10/19/22 0550 10/20/22 0810 10/22/22 0813  NA 134* 134* 133* 137 135  K 5.2* 3.5 3.9 4.3 4.8  CL 94* 93* 92* 98 95*  CO2 26 27 24 22 24   GLUCOSE 116* 96 97 113* 87  BUN 95* 39* 66* 107* 75*  CREATININE 5.04* 2.74* 4.82* 6.48* 4.80*  CALCIUM 8.9 8.6* 8.7* 8.3* 8.6*  MG  --  1.7  --   --  1.8  PHOS  --  3.1  --   --   --    GFR Estimated Creatinine Clearance: 14.3 mL/min (A) (by C-G formula based on SCr of 4.8 mg/dL (H)). Liver Function Tests: Recent Labs  Lab 10/18/22 0134  ALBUMIN 2.0*  No results for input(s): "LIPASE", "AMYLASE" in the last 168 hours. No results for input(s): "AMMONIA" in the last 168 hours. Coagulation profile No results for input(s): "INR", "PROTIME" in the last 168 hours.  CBC: Recent Labs  Lab 10/18/22 0134 10/19/22 0550 10/20/22 0810 10/20/22 1905 10/21/22 1228 10/21/22 1830 10/22/22 0813  WBC 16.0* 19.6* 18.2*  --   30.4*  --  18.9*  NEUTROABS 14.2*  --  16.2*  --   --   --   --   HGB 7.6* 7.2* 5.9* 8.7* 9.1* 7.5* 6.6*  HCT 23.9* 22.5* 18.9* 26.9* 28.5* 22.7* 21.5*  MCV 98.0 99.1 100.5*  --  96.6  --  96.8  PLT 227 261 256  --  238  --  222   Cardiac Enzymes: Recent Labs  Lab 10/19/22 0550  CKTOTAL 22*   BNP: Invalid input(s): "POCBNP" CBG: No results for input(s): "GLUCAP" in the last 168 hours. D-Dimer No results for input(s): "DDIMER" in the last 72 hours. Hgb A1c No results for input(s): "HGBA1C" in the last 72 hours. Lipid Profile No results for input(s): "CHOL", "HDL", "LDLCALC", "TRIG", "CHOLHDL", "LDLDIRECT" in the last 72 hours. Thyroid function studies No results for input(s): "TSH", "T4TOTAL", "T3FREE", "THYROIDAB" in the last 72 hours.  Invalid input(s): "FREET3" Anemia work up No results for input(s): "VITAMINB12", "FOLATE", "FERRITIN", "TIBC", "IRON", "RETICCTPCT" in the last 72 hours. Microbiology No results found for this or any previous visit (from the past 240 hour(s)).   Discharge Instructions:   Discharge Instructions     Ambulatory referral to Neurology   Complete by: As directed    Follow up with stroke clinic NP (Jessica Vanschaick or Darrol Angel, if both not available, consider Manson Allan, or [facility]) at [facility] in about 4 weeks. Thanks.   Diet general   Complete by: As directed    Discharge instructions   Complete by: As directed    Follow-up with hospice care provider at home.   Discharge wound care:   Complete by: As directed    Cleanse sacral wound with saline, pat dry.  Pack sacral wound with saline damp (not soaking) kerlix, top with ABD pad, secure with tape. Change BID   Increase activity slowly   Complete by: As directed       Allergies as of 10/22/2022       Reactions   Benadryl [diphenhydramine] Rash   Lidocaine-prilocaine Rash   Caused red marks up and down arm        Medication List     STOP taking these medications     aspirin EC 81 MG tablet   clopidogrel 75 MG tablet Commonly known as: Plavix   Dialyvite/Zinc Tabs       TAKE these medications    (feeding supplement) PROSource Plus liquid Take 30 mLs by mouth 3 (three) times daily with meals.   nutrition supplement (JUVEN) Pack Take 1 packet by mouth 2 (two) times daily between meals.   albuterol 108 (90 Base) MCG/ACT inhaler Commonly known as: VENTOLIN HFA INHALE 1-2 PUFFS INTO THE LUNGS EVERY 6 (SIX) HOURS AS NEEDED FOR WHEEZING OR SHORTNESS OF BREATH.   ascorbic acid 250 MG tablet Commonly known as: VITAMIN C Take 1 tablet (250 mg total) by mouth 2 (two) times daily.   budesonide 3 MG 24 hr capsule Commonly known as: ENTOCORT EC Take 3 capsules (9 mg total) by mouth daily.   calcium acetate 667 MG capsule Commonly known as: PHOSLO Take 2 capsule by  mouth three times a day with meals What changed:  how much to take when to take this   hydrOXYzine 10 MG tablet Commonly known as: ATARAX Take 10 mg by mouth 3 (three) times daily.   mesalamine 1000 MG suppository Commonly known as: CANASA Place 1 suppository (1,000 mg total) rectally at bedtime.   methocarbamol 500 MG tablet Commonly known as: ROBAXIN Take 1 tablet (500 mg total) by mouth 3 (three) times daily for 10 days.   metoprolol succinate 25 MG 24 hr tablet Commonly known as: TOPROL-XL Take 1 tablet (25 mg total) by mouth daily.   multivitamin Tabs tablet Take 1 tablet by mouth at bedtime.   oxyCODONE-acetaminophen 5-325 MG tablet Commonly known as: PERCOCET/ROXICET Take 1 tablet by mouth every 4 (four) hours as needed for moderate pain or severe pain.   pantoprazole 40 MG tablet Commonly known as: PROTONIX Take 1 tablet (40 mg total) by mouth 2 (two) times daily.   polyethylene glycol 17 g packet Commonly known as: MIRALAX / GLYCOLAX Take 17 g by mouth daily.   rosuvastatin 10 MG tablet Commonly known as: Crestor Take 1 tablet (10 mg total) by mouth  daily.   vitamin A 3 MG (10000 UNITS) capsule Take 1 capsule (10,000 Units total) by mouth daily.               Durable Medical Equipment  (From admission, onward)           Start     Ordered   10/01/22 0711  For home use only DME Negative pressure wound device  Once       Question Answer Comment  Frequency of dressing change 3 times per week   Length of need 3 Months   Dressing type Foam   Amount of suction 120 mm/Hg   Pressure application Continuous pressure   Supplies 10 canisters and 15 dressings per month for duration of therapy      10/01/22 0710              Discharge Care Instructions  (From admission, onward)           Start     Ordered   10/22/22 0000  Discharge wound care:       Comments: Cleanse sacral wound with saline, pat dry.  Pack sacral wound with saline damp (not soaking) kerlix, top with ABD pad, secure with tape. Change BID   10/22/22 1027            Contact information for follow-up providers     [facility]. Schedule an appointment as soon as possible for a visit in 1 month(s).   Specialty: Neurology Why: stroke clinic Contact information: [address] [place] [address] [phone]             Contact information for after-discharge care     Destination     [facility] [place] Preferred [facility] .   Service: Skilled Nursing Contact information: [address] [place] [address] [phone]                      Time coordinating discharge: 39 minutes  Signed:  Karlen Barbar  [facility] 10/22/2022, 10:28 AM

## 2022-10-22 NOTE — TOC Transition Note (Signed)
Transition of Care [facility]) - CM/SW Discharge Note   Patient Details  Name: Albert Harris MRN: [id number] Date of Birth: 09/01/1955  Transition of [facility]) CM/SW Contact:  Gordy Clement, RN Phone Number: 10/22/2022, 2:27 PM   Clinical Narrative:   Patient will DC to home on Hospice care.  [facility] has accepted referral and has confirmed delivery of hospital bed to the home. Wife will be waiting in the home and Daughter will be following ambulance. [facility] is aware of transportation home today.    No additional TOC needs       Final next level of care: Home w Hospice Care Barriers to Discharge: Continued Medical Work up   Patient Goals and CMS Choice CMS Medicare.gov Compare Post Acute Care list provided to:: Patient Represenative (must comment) (Daughter-[name]) Choice offered to / list presented to : Patient, Adult Children, Spouse  Discharge Placement                         Discharge Plan and Services Additional resources added to the After Visit Summary for   In-house Referral: Clinical Social Work   Post Acute Care Choice: Hospice          DME Arranged: Hospice Equipment Package Others DME Agency: [facility] Date DME Agency Contacted: 10/21/22   Representative spoke with at DME Agency: Per [facility]   [facility] Agency: [facility] Date [facility] Agency Contacted: 10/21/22 Time HH Agency Contacted: [phone] Representative spoke with at [facility] Agency: [place]  Social Determinants of Health (SDOH) Interventions SDOH Screenings   Food Insecurity: No Food Insecurity (09/29/2022)  Recent Concern: Food Insecurity - Food Insecurity Present (08/22/2022)  Housing: Low Risk  (09/29/2022)  Transportation Needs: No Transportation Needs (09/29/2022)  Recent Concern: Transportation Needs - Unmet Transportation Needs (08/22/2022)  Utilities: Not At Risk (09/29/2022)  Alcohol Screen: Low Risk  (09/12/2019)  Depression  (PHQ2-9): High Risk (05/07/2022)  Financial Resource Strain: Low Risk  (09/12/2019)  Stress: No Stress Concern Present (09/12/2019)  Tobacco Use: Medium Risk (09/29/2022)     Readmission Risk Interventions    08/24/2022   11:27 AM 08/14/2022   10:07 AM 07/24/2022   11:52 AM  Readmission Risk Prevention Plan  Transportation Screening Complete Complete Complete  Medication Review (RN Care Manager) Referral to Pharmacy Referral to Pharmacy Complete  PCP or Specialist appointment within 3-5 days of discharge  Complete Complete  HRI or Home Care Consult Complete Patient refused Complete  SW Recovery Care/Counseling Consult Complete Complete Patient refused  Palliative Care Screening Not Applicable Not Applicable Not Applicable  Skilled Nursing Facility Not Applicable Not Applicable Not Applicable

## 2022-10-22 NOTE — Progress Notes (Signed)
[facility] Progress Note   Subjective:    Seen and examined patient on HD. Plan for [name] to transition to home hospice making today his last HD treatment. UFG set 1L. Noted Hgb 6.6. Discussed with Palliative Care team. Ordered 1 unit PRBCs to be given with HD today.  Objective Vitals:   10/22/22 0830 10/22/22 0900 10/22/22 0930 10/22/22 1000  BP: 105/66 92/63 107/70 114/74  Pulse: 87 90 87 93  Resp: 16 19 17 16   Temp:      TempSrc:      SpO2: 100% 100% 96%   Weight:      Height:       Physical Exam General: Chronically ill and frail man. NAD. Room air. Heart: RRR; no murmur Lungs: CTA anteriorly Abdomen: soft Extremities: No LE edema; ischemic/gangrene L toes; wound vac noted L groin removed Dialysis Access: TDC, L forearm AVG + bruit    Filed Weights   10/20/22 0700 10/21/22 0228 10/22/22 0500  Weight: 60.5 kg 68.5 kg 70.2 kg    Intake/Output Summary (Last 24 hours) at 10/22/2022 1022 Last data filed at 10/21/2022 2000 Gross per 24 hour  Intake 360 ml  Output --  Net 360 ml    Additional Objective Labs: Basic Metabolic Panel: Recent Labs  Lab 10/18/22 0134 10/19/22 0550 10/20/22 0810 10/22/22 0813  NA 134* 133* 137 135  K 3.5 3.9 4.3 4.8  CL 93* 92* 98 95*  CO2 27 24 22 24   GLUCOSE 96 97 113* 87  BUN 39* 66* 107* 75*  CREATININE 2.74* 4.82* 6.48* 4.80*  CALCIUM 8.6* 8.7* 8.3* 8.6*  PHOS 3.1  --   --   --    Liver Function Tests: Recent Labs  Lab 10/18/22 0134  ALBUMIN 2.0*   No results for input(s): "LIPASE", "AMYLASE" in the last 168 hours. CBC: Recent Labs  Lab 10/18/22 0134 10/19/22 0550 10/20/22 0810 10/20/22 1905 10/21/22 1228 10/21/22 1830 10/22/22 0813  WBC 16.0* 19.6* 18.2*  --  30.4*  --  18.9*  NEUTROABS 14.2*  --  16.2*  --   --   --   --   HGB 7.6* 7.2* 5.9*   < > 9.1* 7.5* 6.6*  HCT 23.9* 22.5* 18.9*   < > 28.5* 22.7* 21.5*  MCV 98.0 99.1 100.5*  --  96.6  --  96.8  PLT 227 261 256  --  238  --  222    < > = values in this interval not displayed.   Blood Culture    Component Value Date/Time   SDES TISSUE 09/09/2022 1453   SPECREQUEST VESSEL PT ON VANC ANCEF 09/09/2022 1453   CULT  09/09/2022 1453    RARE STAPHYLOCOCCUS AUREUS SUSCEPTIBILITIES PERFORMED ON PREVIOUS CULTURE WITHIN THE LAST 5 DAYS. NO ANAEROBES ISOLATED Performed at [facility], [address] [place], [place] [address]    REPTSTATUS 09/14/2022 FINAL 09/09/2022 1453    Cardiac Enzymes: Recent Labs  Lab 10/19/22 0550  CKTOTAL 22*   CBG: No results for input(s): "GLUCAP" in the last 168 hours. Iron Studies: No results for input(s): "IRON", "TIBC", "TRANSFERRIN", "FERRITIN" in the last 72 hours. Lab Results  Component Value Date   INR 1.2 08/12/2022   INR 1.3 (H) 06/29/2022   INR 1.3 (H) 06/28/2022   Studies/Results: No results found.  Medications:  sodium chloride 0 mL/hr at 09/22/22 0757   DAPTOmycin (CUBICIN) 600 mg in sodium chloride 0.9 % IVPB 600 mg (10/20/22 1803)  sodium chloride      (feeding supplement) PROSource Plus  30 mL Oral TID WC   sodium chloride   Intravenous Once   sodium chloride   Intravenous Once   acetaminophen  650 mg Oral Q6H   ascorbic acid  250 mg Oral BID   budesonide  9 mg Oral Daily   calcium acetate  667 mg Oral TID WC   Chlorhexidine Gluconate Cloth  6 each Topical Q0600   collagenase   Topical Daily   darbepoetin (ARANESP) injection - DIALYSIS  200 mcg Subcutaneous Q Wed-1800   liver oil-zinc oxide   Topical TID   mesalamine  1,000 mg Rectal QHS   methocarbamol  500 mg Oral TID   metoprolol succinate  25 mg Oral Daily   multivitamin  1 tablet Oral QHS   nutrition supplement (JUVEN)  1 packet Oral BID BM   pantoprazole  40 mg Oral BID   polyethylene glycol  17 g Oral Daily   rosuvastatin  10 mg Oral Daily   sodium chloride flush  10-40 mL Intracatheter Q12H   vitamin A  10,000 Units Oral Daily    Dialysis Orders: SW TTS 4h   350/800  64.5kg  new  TDC (RFA AVG clotted)  Heparin none  - venofer 50mg  IV weekly - rocaltrol 0.25 mcg po tiw - phoslo 2 ac tid - no esa, last Hb 11.0  Assessment/Plan: Recurrent lower GIB: S/p colonoscopy 09/15/22, then repeat sigmoidoscopy 4/11. Several bleeding ulcers addressed.  Path came back + Crohn's - started on mesalamine by GI. Brisk re-bleed 4/18, took again for emergent sigmoidoscopy with hemostatic gel applied to bleeding ulcer. Then bled again requiring suturing of bleeding ulcers 4/20 by Dr. Magnus Ivan. Hgb 8.8 -> 6.9 on 5/11 - given another 1U blood. S/p 2 units PRBCs for Hgb 5.9 5/14-Hgb now 9.1. Ordered 1 unit PRBCs today for Hgb 6.6. 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 S/p iliofemoral bypass, removal of infected patch with sartorius muscle flap 09/09/22. Ischemic changes to L 3rd-5th toes persist. Continue to monitor.  Sacral wound: Non-healing and progressing in size per VVS. Followed by [facility]. 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 CVA on MRI. Neuro signed off.  ESRD: S/p short-term CRRT 3/23- 3/26, now back on iHD on TTS schedule. On HD. This is last hemodialysis. See # 11 BP/volume: No LE edema, below prior EDW. Anemia (ESRD + ABLA): Ordered 1 unit PRBCs today for Hgb 6.6. S/p 2U on 5/14 (total of 29U this admit). Also on Aranesp q Wed (max dose).  Secondary HPTH: CorrCa high, phos at goal. Holding VDRA, Phoslo dose has been reduced. Dialysis access: AVG clotted 3/27, went for declot by VVS and then re-clotted. VVS placed R TDC on 4/1 but there is a strong bruit in the AVG 5/7 - attempted and unable to cannulate on 5/9. Use catheter for now. Hypoalbuminemia - Alb 1.7, in hospital > 40 days. On supplements. Goals of care: Patient made DNR as of 5/11. Dr. Karin Lieu and Palliative NP had a long discussion with Albert Harris and his  family yesterday regarding his multiple medical problems and co morbidities. Noted decision made by Mr. Charney to stop HD after today and proceed with comfort care measures. Referral placed yesterday to [facility] home hospice.  Salome Holmes, NP [facility] 10/22/2022,10:22 AM  LOS: 55  days

## 2022-10-22 NOTE — Progress Notes (Signed)
Chart reviewed and noted plans for home with hospice. Contacted [facility] to make clinic aware of pt's d/c plan and that pt will not require services at d/c.   Olivia Canter Renal Navigator [phone]

## 2022-10-23 LAB — BPAM RBC: Unit Type and Rh: 5100

## 2022-10-23 LAB — TYPE AND SCREEN

## 2022-10-24 LAB — TYPE AND SCREEN
Unit division: 0
Unit division: 0

## 2022-10-24 LAB — BPAM RBC
Blood Product Expiration Date: 202406152359
Blood Product Expiration Date: 202406162359
ISSUE DATE / TIME: 202405142353
ISSUE DATE / TIME: 202405150050
Unit Type and Rh: 5100
Unit Type and Rh: 5100

## 2022-12-07 DEATH — deceased
